# Patient Record
Sex: Male | Born: 1960
Health system: Southern US, Community
[De-identification: ages and names within clinical notes are randomized; demographics above are authoritative.]

## PROBLEM LIST (undated history)

## (undated) DIAGNOSIS — A401 Sepsis due to streptococcus, group B: Secondary | ICD-10-CM

## (undated) DIAGNOSIS — M1990 Unspecified osteoarthritis, unspecified site: Secondary | ICD-10-CM

## (undated) DIAGNOSIS — G4730 Sleep apnea, unspecified: Secondary | ICD-10-CM

## (undated) DIAGNOSIS — I739 Peripheral vascular disease, unspecified: Secondary | ICD-10-CM

## (undated) DIAGNOSIS — Z973 Presence of spectacles and contact lenses: Secondary | ICD-10-CM

## (undated) DIAGNOSIS — I10 Essential (primary) hypertension: Secondary | ICD-10-CM

## (undated) DIAGNOSIS — E1140 Type 2 diabetes mellitus with diabetic neuropathy, unspecified: Secondary | ICD-10-CM

## (undated) DIAGNOSIS — A498 Other bacterial infections of unspecified site: Secondary | ICD-10-CM

## (undated) DIAGNOSIS — M869 Osteomyelitis, unspecified: Secondary | ICD-10-CM

## (undated) DIAGNOSIS — T8140XA Infection following a procedure, unspecified, initial encounter: Secondary | ICD-10-CM

## (undated) DIAGNOSIS — S92309A Fracture of unspecified metatarsal bone(s), unspecified foot, initial encounter for closed fracture: Secondary | ICD-10-CM

## (undated) DIAGNOSIS — N400 Enlarged prostate without lower urinary tract symptoms: Secondary | ICD-10-CM

## (undated) HISTORY — DX: Sepsis due to Streptococcus, group B: A40.1

## (undated) HISTORY — PX: FRACTURE SURGERY: SHX138

## (undated) HISTORY — DX: Other bacterial infections of unspecified site: A49.8

## (undated) HISTORY — PX: COLONOSCOPY W/ BIOPSIES AND POLYPECTOMY: SHX1376

## (undated) HISTORY — PX: TOE AMPUTATION: SHX809

---

## 2003-07-02 HISTORY — PX: PATELLA RECONSTRUCTION: SHX736

## 2004-12-14 ENCOUNTER — Ambulatory Visit: Payer: Self-pay | Admitting: Unknown Physician Specialty

## 2004-12-29 ENCOUNTER — Ambulatory Visit: Payer: Self-pay | Admitting: Unknown Physician Specialty

## 2005-09-10 ENCOUNTER — Emergency Department: Payer: Self-pay | Admitting: Emergency Medicine

## 2005-11-30 ENCOUNTER — Emergency Department: Payer: Self-pay | Admitting: Internal Medicine

## 2006-05-11 ENCOUNTER — Emergency Department: Payer: Self-pay | Admitting: Emergency Medicine

## 2009-05-10 ENCOUNTER — Emergency Department: Payer: Self-pay | Admitting: Emergency Medicine

## 2009-08-06 ENCOUNTER — Inpatient Hospital Stay: Payer: Self-pay | Admitting: Internal Medicine

## 2009-08-11 ENCOUNTER — Encounter: Admission: RE | Admit: 2009-08-11 | Discharge: 2009-08-11 | Payer: Self-pay | Admitting: Foot & Ankle Surgery

## 2009-08-22 ENCOUNTER — Ambulatory Visit (HOSPITAL_BASED_OUTPATIENT_CLINIC_OR_DEPARTMENT_OTHER): Admission: RE | Admit: 2009-08-22 | Discharge: 2009-08-22 | Payer: Self-pay | Admitting: Foot & Ankle Surgery

## 2009-11-08 ENCOUNTER — Emergency Department: Payer: Self-pay | Admitting: Emergency Medicine

## 2010-03-24 ENCOUNTER — Emergency Department: Payer: Self-pay | Admitting: Emergency Medicine

## 2010-05-31 ENCOUNTER — Emergency Department: Payer: Self-pay | Admitting: Emergency Medicine

## 2010-08-30 ENCOUNTER — Emergency Department: Payer: Self-pay | Admitting: Emergency Medicine

## 2010-09-20 LAB — GLUCOSE, CAPILLARY: Glucose-Capillary: 213 mg/dL — ABNORMAL HIGH (ref 70–99)

## 2010-09-20 LAB — POCT I-STAT 4, (NA,K, GLUC, HGB,HCT)
HCT: 48 % (ref 39.0–52.0)
Hemoglobin: 16.3 g/dL (ref 13.0–17.0)
Sodium: 138 mEq/L (ref 135–145)

## 2010-12-24 ENCOUNTER — Other Ambulatory Visit (HOSPITAL_COMMUNITY): Payer: Self-pay | Admitting: Podiatry

## 2010-12-24 DIAGNOSIS — M86171 Other acute osteomyelitis, right ankle and foot: Secondary | ICD-10-CM

## 2010-12-28 ENCOUNTER — Other Ambulatory Visit (HOSPITAL_COMMUNITY): Payer: Self-pay

## 2010-12-28 ENCOUNTER — Encounter (HOSPITAL_COMMUNITY): Payer: Self-pay

## 2011-01-04 ENCOUNTER — Encounter (HOSPITAL_COMMUNITY)
Admission: RE | Admit: 2011-01-04 | Discharge: 2011-01-04 | Disposition: A | Discharge status: Home / Self Care | Payer: Managed Care, Other (non HMO) | Source: Ambulatory Visit | Attending: Podiatry | Admitting: Podiatry

## 2011-01-04 ENCOUNTER — Encounter (HOSPITAL_COMMUNITY): Payer: Self-pay

## 2011-01-04 DIAGNOSIS — M86171 Other acute osteomyelitis, right ankle and foot: Secondary | ICD-10-CM

## 2011-01-04 DIAGNOSIS — M86179 Other acute osteomyelitis, unspecified ankle and foot: Secondary | ICD-10-CM | POA: Insufficient documentation

## 2011-01-04 HISTORY — DX: Osteomyelitis, unspecified: M86.9

## 2011-01-04 MED ORDER — TECHNETIUM TC 99M MEDRONATE IV KIT
24.0000 | PACK | Freq: Once | INTRAVENOUS | Status: AC | PRN
  Administered 2011-01-04: 24 via INTRAVENOUS

## 2011-11-08 ENCOUNTER — Emergency Department: Payer: Self-pay | Admitting: *Deleted

## 2011-11-08 LAB — BASIC METABOLIC PANEL
Anion Gap: 7 (ref 7–16)
BUN: 15 mg/dL (ref 7–18)
Chloride: 101 mmol/L (ref 98–107)
Osmolality: 280 (ref 275–301)
Potassium: 4 mmol/L (ref 3.5–5.1)
Sodium: 135 mmol/L — ABNORMAL LOW (ref 136–145)

## 2011-11-08 LAB — CBC
HCT: 42.2 % (ref 40.0–52.0)
HGB: 14.4 g/dL (ref 13.0–18.0)
MCHC: 34 g/dL (ref 32.0–36.0)
Platelet: 166 10*3/uL (ref 150–440)
RBC: 4.38 10*6/uL — ABNORMAL LOW (ref 4.40–5.90)
WBC: 9.9 10*3/uL (ref 3.8–10.6)

## 2011-11-08 LAB — CK TOTAL AND CKMB (NOT AT ARMC)
CK, Total: 146 U/L (ref 35–232)
CK-MB: 1.2 ng/mL (ref 0.5–3.6)

## 2011-11-09 LAB — CK TOTAL AND CKMB (NOT AT ARMC): CK, Total: 129 U/L (ref 35–232)

## 2011-11-21 ENCOUNTER — Ambulatory Visit: Payer: Self-pay | Admitting: Cardiovascular Disease

## 2011-11-21 LAB — BASIC METABOLIC PANEL
Anion Gap: 7 (ref 7–16)
BUN: 12 mg/dL (ref 7–18)
Calcium, Total: 9.5 mg/dL (ref 8.5–10.1)
EGFR (Non-African Amer.): >60
Sodium: 138 mmol/L (ref 136–145)

## 2011-12-17 ENCOUNTER — Ambulatory Visit: Payer: Self-pay | Admitting: Urology

## 2012-07-01 HISTORY — PX: PROSTATE ABLATION: SHX6042

## 2012-10-26 ENCOUNTER — Emergency Department: Payer: Self-pay | Admitting: Emergency Medicine

## 2012-10-26 LAB — COMPREHENSIVE METABOLIC PANEL
Alkaline Phosphatase: 90 U/L (ref 50–136)
BUN: 13 mg/dL (ref 7–18)
Bilirubin,Total: 0.5 mg/dL (ref 0.2–1.0)
Chloride: 98 mmol/L (ref 98–107)
Creatinine: 0.93 mg/dL (ref 0.60–1.30)
SGPT (ALT): 25 U/L (ref 12–78)

## 2012-10-26 LAB — CBC
HCT: 40.5 % (ref 40.0–52.0)
HGB: 14 g/dL (ref 13.0–18.0)
MCH: 31.9 pg (ref 26.0–34.0)
RBC: 4.4 10*6/uL (ref 4.40–5.90)

## 2012-10-29 LAB — COMPREHENSIVE METABOLIC PANEL
Albumin: 3.7 g/dL (ref 3.4–5.0)
BUN: 14 mg/dL (ref 7–18)
Bilirubin,Total: 0.3 mg/dL (ref 0.2–1.0)
Calcium, Total: 9.8 mg/dL (ref 8.5–10.1)
Co2: 29 mmol/L (ref 21–32)
Creatinine: 0.95 mg/dL (ref 0.60–1.30)
EGFR (African American): >60
EGFR (Non-African Amer.): >60
Glucose: 249 mg/dL — ABNORMAL HIGH (ref 65–99)
Osmolality: 281 (ref 275–301)
Potassium: 4 mmol/L (ref 3.5–5.1)
Sodium: 136 mmol/L (ref 136–145)
Total Protein: 8.4 g/dL — ABNORMAL HIGH (ref 6.4–8.2)

## 2012-10-29 LAB — CBC
HCT: 41.9 % (ref 40.0–52.0)
HGB: 14.6 g/dL (ref 13.0–18.0)
MCH: 32.4 pg (ref 26.0–34.0)
MCHC: 35 g/dL (ref 32.0–36.0)
MCV: 93 fL (ref 80–100)
RBC: 4.52 10*6/uL (ref 4.40–5.90)
WBC: 9 10*3/uL (ref 3.8–10.6)

## 2012-10-30 ENCOUNTER — Inpatient Hospital Stay: Payer: Self-pay | Admitting: Internal Medicine

## 2012-10-30 LAB — SEDIMENTATION RATE: Erythrocyte Sed Rate: 46 mm/hr — ABNORMAL HIGH (ref 0–20)

## 2012-10-31 LAB — VANCOMYCIN, TROUGH: Vancomycin, Trough: 10 ug/mL (ref 10–20)

## 2012-11-01 LAB — CBC WITH DIFFERENTIAL/PLATELET
Basophil #: 0 10*3/uL (ref 0.0–0.1)
Basophil %: 0.4 %
Eosinophil %: 3.3 %
HCT: 40.3 % (ref 40.0–52.0)
HGB: 14.1 g/dL (ref 13.0–18.0)
Lymphocyte %: 29.1 %
MCH: 32 pg (ref 26.0–34.0)
Monocyte #: 0.9 x10 3/mm (ref 0.2–1.0)
Monocyte %: 13.4 %
Neutrophil #: 3.8 10*3/uL (ref 1.4–6.5)
Platelet: 215 10*3/uL (ref 150–440)
WBC: 7 10*3/uL (ref 3.8–10.6)

## 2012-11-04 LAB — CULTURE, BLOOD (SINGLE)

## 2014-03-26 ENCOUNTER — Emergency Department: Payer: Self-pay | Admitting: Student

## 2014-03-26 LAB — BASIC METABOLIC PANEL
ANION GAP: 2 — AB (ref 7–16)
BUN: 18 mg/dL (ref 7–18)
CALCIUM: 8.7 mg/dL (ref 8.5–10.1)
Chloride: 103 mmol/L (ref 98–107)
Co2: 29 mmol/L (ref 21–32)
Creatinine: 0.86 mg/dL (ref 0.60–1.30)
EGFR (African American): >60
Glucose: 150 mg/dL — ABNORMAL HIGH (ref 65–99)
OSMOLALITY: 273 (ref 275–301)
POTASSIUM: 4.3 mmol/L (ref 3.5–5.1)
Sodium: 134 mmol/L — ABNORMAL LOW (ref 136–145)

## 2014-03-26 LAB — CBC
HCT: 39.7 % — ABNORMAL LOW (ref 40.0–52.0)
HGB: 13 g/dL (ref 13.0–18.0)
MCH: 30.8 pg (ref 26.0–34.0)
MCHC: 32.6 g/dL (ref 32.0–36.0)
MCV: 94 fL (ref 80–100)
Platelet: 202 10*3/uL (ref 150–440)
RBC: 4.21 10*6/uL — ABNORMAL LOW (ref 4.40–5.90)
RDW: 12.9 % (ref 11.5–14.5)
WBC: 8 10*3/uL (ref 3.8–10.6)

## 2014-10-21 NOTE — Discharge Summary (Signed)
PATIENT NAME:  [name], [name] MR#:  [id number] DATE OF BIRTH:  1960-12-03  DATE OF ADMISSION:  10/30/2012  DATE OF DISCHARGE:  11/01/2012  ADMITTING PHYSICIAN:  Dr. Lunette Stands   PRIMARY CARE PHYSICIAN: Dr. Elijio Miles  CONSULTATIONS:  Podiatry, Samara Deist  PROCEDURE:  MRI of the foot, right.  HOSPITAL COURSE: This 54 year old gentleman with past medical history of diabetes mellitus and medication noncompliance presented to the Emergency Room with worsening swelling, pain and rubor in his right foot. He states that he visits a podiatrist, who performed a debridement. However, the podiatrist thought that there was a possibly of osteomyelitis, and instructed the patient to come to the Emergency Room for possible admission.  Please refer to History and Physical for full details. The patient underwent MRI of his right foot, which did not reveal any osteomyelitis, was treated for cellulitis and diabetic ulcer. Dr. Vickki Muff, podiatrist, assessed him and recommended continuation of home p.o. antibiotics, and did not recommend any further debridement or interventions at this time. The patient'[name] presenting glucose was very high due to the fact that he had not been taking his medications for several months. This was controlled with resumption of his oral pills and sliding scale insulin in house. The patient was counseled on medication noncompliance, and agreed to comply in the future. The patient'[name] hospital course was otherwise uncomplicated. He was discharged to home in a stable condition, and advised to follow up with his podiatrist as scheduled, and also with me for control of his diabetes.   DISCHARGE MEDICATIONS:  1.  Janumet 50/1000, 1 tab daily.  2.  Bactrim DS 800/160, 2 tabs b.i.d.  3.  Percocet  5/325, 1 tab every 6 hours as needed.  4.  Benicar HCT 25/40, 1 tab daily.   DISCHARGE INSTRUCTIONS:   Diet:  Water-controlled diet, low sodium diet.   Activity as tolerated. No weight-bearing on right  foot.   Followup:  1 to 2 weeks with Dr. Elijio Miles. Patient should keep podiatrist appointment.   Discharge process time spent: 35 minutes     ____________________________ Venetia Maxon. Elijio Miles, MD sat:mr D: 11/15/2012 13:15:45 ET T: 11/15/2012 22:31:46 ET JOB#: [id number]  cc: Alfredia Ferguson A. Elijio Miles, MD, <Dictator> Veverly Fells MD ELECTRONICALLY SIGNED 11/19/2012 14:09
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
## 2014-10-21 NOTE — H&P (Signed)
PATIENT NAME:  [name], [name] MR#:  [id number] DATE OF BIRTH:  [birth year]  DATE OF ADMISSION:  10/29/2012  REFERRING PHYSICIAN: Dr. Marjean Donna.   PRIMARY CARE PHYSICIAN: Dr. Elijio Miles.   CHIEF COMPLAINT: Right foot swelling.   HISTORY OF PRESENT ILLNESS: The patient is a 54 year old African American male with history of diabetes mellitus poorly controlled per the patient, hypertension, chronic nonhealing ulcer of the right foot. Came to the Emergency Department with complaints of increased swelling of the foot for the last 2 to 3 days. The patient initially presented on 10/26/2012 with complaints of increased swelling and pain in the right foot. Denied having any fever. Denied having any increased drainage. Had workup done. X-ray revealed there was increased lucency which was concerning for osteomyelitis. However, the patient's sed rate was 19. The patient was discharged home. The patient went to his podiatrist as this was not improving. The patient's podiatrist debrided the wound and sent the patient to the Emergency Department. The patient is an extremely poor history. Again, re-emphasize that the patient is not having any fever, drainage from the foot. However, states has somewhat increased pain in the foot. In the Emergency Department, the patient received vancomycin and Zosyn. Has normal white blood cell count.   PAST MEDICAL HISTORY:  1. Diabetes mellitus, on oral medication.  2. Hypertension.  3. Hyperlipidemia.  4. Alcohol use.   PAST SURGICAL HISTORY: Left knee surgery.   ALLERGIES: None.   HOME MEDICATIONS:  1. Percocet 5/325 mg oral every 6 hours as needed.  2. Janumet 50,000 mg once a day.  3. Benicar/hydrochlorothiazide 25/40 mg once a day.  4. Bactrim 800/160 two tablets 2 times a day.   SOCIAL HISTORY: Denies smoking. Drinks alcohol on a regular basis, 3 to 4 beers a day. Denies using any illicit drugs   FAMILY HISTORY: Diabetes mellitus and hypertension. Father died  of stroke at the age of 4.   REVIEW OF SYSTEMS:  CONSTITUTIONAL: Denies any fever, fatigue, weight loss.  EYES: No change in vision.  EARS: No change in hearing. No tinnitus.  RESPIRATORY: No cough, shortness of breath.  CARDIOVASCULAR: No chest pain, palpitations.  GASTROINTESTINAL: Has a good appetite and has regular bowel movements. No abdominal pain.  GENITOURINARY: No dysuria or hematuria.  SKIN: No rash or lesions.  HEMATOLOGIC: No easy bruising or bleeding.  MUSCULOSKELETAL: Pain in the right foot.  ENDOCRINE: No polyuria or polydipsia.  NEUROLOGIC: No weakness, numbness.   PHYSICAL EXAMINATION:  GENERAL: This is a well-built, well-nourished, age-appropriate main lying down in the bed, not in distress.  VITAL SIGNS: Temperature 98.1, pulse 90, blood pressure 146/69, respiratory rate of 16, oxygen saturation is 97% on room air.  HEENT: Head normocephalic, atraumatic. There is no scleral icterus. Conjunctivae normal. Pupils equal and reactive to light. Extraocular movements intact. Mucous membranes moist.  NECK: Supple. No lymphadenopathy. No JVD. No carotid bruit. No pharyngeal erythema.  CHEST: Has no focal tenderness.  LUNGS: Bilaterally clear to auscultation.  HEART: S1 and S2 regular. No murmurs are heard.  LOWER EXTREMITIES: No swelling. No pedal edema. Pulses  in the dorsalis pedis bilaterally.  ABDOMEN: Bowel sounds present. Soft, nontender, nondistended.  MUSCULOSKELETAL: Good range of motion in all of the extremities. Has a right foot ulcer.  SKIN: No rash or lesions.  LYMPHATIC: No cervical, axillary or inguinal lymphadenopathy.  NEUROLOGIC: The patient is alert, oriented to place, person and time. Cranial nerves II through XII intact. No motor and sensory deficits.  LABS: X-RAY OF THE FOOT: There is a lucency of the distal aspect of the proximal phalanx of the great toe. This could reflect osteomyelitis in the appropriate clinical setting.   LOWER EXTREMITY  DOPPLERS: No DVT.    Sed rate is 19.   CMP: Except for a glucose of 205, the rest of the values are within normal limits.   CBC: WBC of 9, hemoglobin 14.6, platelet count of 204.   ASSESSMENT AND PLAN: The patient is a 54 year old male who comes to the Emergency Department with increased pain and swelling on the right below the great toe.  1. Right foot osteomyelitis: This does not seem to be anything acute. Does not have increased swelling or redness around this area. No fluctuance. Has a red base. Seems to have good granulation tissue. Will obtain sed rate and CRP. Obtain MRI of the right foot. Will continue with vancomycin and Zosyn for now. Consider consulting podiatry in the morning.  2. Diabetes mellitus, poorly controlled: Will obtain hemoglobin A1c. Will hold the oral medications for now. Continue with sliding scale insulin.  3. Hypertension, currently well controlled: Continue with home medications.  4. Alcohol use: Counseled with the patient. However, the patient seems to have poor insight.  5. Keep the patient on deep vein thrombosis prophylaxis with Lovenox.   TIME SPENT: 50 minutes.   ____________________________ Monica Becton, MD pv:gb D: 10/30/2012 01:41:39 ET T: 10/30/2012 02:54:05 ET JOB#: [id number]  cc: Monica Becton, MD, <Dictator> Sheikh A. Elijio Miles, MD Monica Becton MD ELECTRONICALLY SIGNED 11/02/2012 7:02

## 2014-10-23 NOTE — Op Note (Signed)
PATIENT NAME:  [name], [name] MR#:  [id number] DATE OF BIRTH:  03-12-1961  DATE OF PROCEDURE:  12/17/2011  PREOPERATIVE DIAGNOSES:  1. Benign prostatic hypertrophy.  2. Urinary retention.   POSTOPERATIVE DIAGNOSES:  1. Benign prostatic hypertrophy.  2. Urinary retention.   PROCEDURE: KTP laser prostatectomy.   SURGEON: Edrick Oh, M.D.   ANESTHESIA: Laryngeal mask airway anesthesia.   INDICATIONS: The patient is a 54 year old [place] American gentleman who initially presented for evaluation of other issues. He was found to have significant obstructive voiding symptoms and was found to be in urinary retention. He was placed on a trial of medical management and Foley catheter drainage. He failed subsequent voiding trials. He does have prominent bilobar hypertrophy with high riding bladder neck. He presents for KTP laser prostatectomy.   PROCEDURE: After informed consent was obtained, the patient was taken to the operating room and placed in the dorsal lithotomy position under laryngeal mask airway anesthesia. The patient was then prepped and draped in the usual standard fashion. The KTP laser resectoscope was placed into the urinary bladder utilizing the visual obturator. The prostatic urethra demonstrated bilobar hypertrophy with complete visual obstruction. There was also noted to be at high riding bladder neck. The overall length of the prostatic urethra was relatively short. The verumontanum was easily identified. Some inflammatory changes were present consistent with Foley catheterization. The bladder was inspected in its entirety. Some areas of inflammation were noted on the posterior bladder wall and bladder base, also consistent with catheterization. There was no evidence of tumor or other significant abnormality. Bilateral ureteral orifices were well visualized. They were noted to be an adequate distance from the bladder neck region. The KTP laser fiber was then placed through the scope.  Resection was begun at the level of the bladder neck at the six o'clock position at an 80-watt setting. An incision was made through the bladder neck to the level of the trigone. Additional lateral lobe tissue was taken down. Once an adequate opening was obtained the watts were increased to 120. Additional resection was undertaken of the bilobar tissue. Some anterior tissue was also present. The resection was taken back to the level of the verumontanum. No significant bleeding was encountered. At the completion of the vaporization, an open prostatic channel was present. The laser scope was removed. An 56 French Foley catheter was placed to gravity drainage without difficulty. The patient was returned to the supine position. He was awakened from laryngeal mask airway anesthesia. He was taken to the recovery room in stable condition. There were no problems or complications. The patient tolerated the procedure well.     ____________________________ Denice Bors. Jacqlyn Larsen, MD bsc:bjt D: 12/17/2011 09:57:58 ET T: 12/17/2011 11:26:41 ET JOB#: [id number]  cc: Denice Bors. Jacqlyn Larsen, MD, <Dictator> Denice Bors Katina Remick MD ELECTRONICALLY SIGNED 12/17/2011 13:09

## 2014-10-30 ENCOUNTER — Ambulatory Visit
Admission: EM | Admit: 2014-10-30 | Discharge: 2014-10-30 | Disposition: A | Discharge status: Home / Self Care | Payer: Worker's Compensation | Attending: Internal Medicine | Admitting: Internal Medicine

## 2014-10-30 DIAGNOSIS — L03116 Cellulitis of left lower limb: Secondary | ICD-10-CM | POA: Diagnosis not present

## 2014-10-30 DIAGNOSIS — Z23 Encounter for immunization: Secondary | ICD-10-CM

## 2014-10-30 DIAGNOSIS — S81812A Laceration without foreign body, left lower leg, initial encounter: Secondary | ICD-10-CM | POA: Diagnosis not present

## 2014-10-30 DIAGNOSIS — E118 Type 2 diabetes mellitus with unspecified complications: Secondary | ICD-10-CM | POA: Diagnosis not present

## 2014-10-30 HISTORY — DX: Essential (primary) hypertension: I10

## 2014-10-30 MED ORDER — MUPIROCIN CALCIUM 2 % EX CREA: 1.0000 "application " | TOPICAL_CREAM | Freq: Two times a day (BID) | CUTANEOUS | Status: DC

## 2014-10-30 MED ORDER — CLINDAMYCIN HCL 150 MG PO CAPS: 450.0000 mg | ORAL_CAPSULE | Freq: Three times a day (TID) | ORAL | Status: AC

## 2014-10-30 MED ORDER — TETANUS-DIPHTH-ACELL PERTUSSIS 5-2.5-18.5 LF-MCG/0.5 IM SUSP: 0.5000 mL | Freq: Once | INTRAMUSCULAR | Status: AC

## 2014-10-30 NOTE — ED Notes (Signed)
Pt states "I bumped into a cart at work and scraped leg."

## 2014-10-30 NOTE — ED Provider Notes (Addendum)
CSN: [id number]     Arrival date & time 10/30/14  1222 History   First MD Initiated Contact with Patient 10/30/14 1456     Chief Complaint  Patient presents with  . Abrasion   (Consider location/radiation/quality/duration/timing/severity/associated sxs/prior Treatment) HPI  54y/o african american diabetic male with pain/erythema left anterior shin after scraping leg on metal cart at work 21 Oct 2013.  Patient reports burning sensation to affected area, noted pus discharge a couple days ago had clindamycin at home which he has been taking for past 4 days ran out of pills this am.  Had also been applying topical antibiotic cream to wounds concerned as had partial toe amputation right foot last year from it rubbing on his boot and getting infected.  Here for evaluation of possible leg infection.  Last fingerstick 180 yesterday.  Patient reports normal for him.  Past Medical History  Diagnosis Date  . Diabetes mellitus   . Osteomyelitis of toe of right foot   . Hypertension    Past Surgical History  Procedure Laterality Date  . Patella reconstruction  2005  . Prostate ablation  2014   History reviewed. No pertinent family history. History  Substance Use Topics  . Smoking status: Never Smoker   . Smokeless tobacco: Not on file  . Alcohol Use: No    Review of Systems  Constitutional: Negative.   HENT: Negative.   Eyes: Negative.   Respiratory: Negative.   Cardiovascular: Positive for leg swelling.  Gastrointestinal: Negative.   Musculoskeletal: Positive for myalgias. Negative for back pain, joint swelling, arthralgias, gait problem, neck pain and neck stiffness.  Skin: Positive for color change, rash and wound. Negative for pallor.  Allergic/Immunologic: Negative.   Neurological: Negative.   Hematological: Negative.   Psychiatric/Behavioral: Negative.     Allergies  Review of patient's allergies indicates no known allergies.  Home Medications   Prior to Admission  medications   Medication Sig Start Date End Date Taking? Authorizing Provider  aspirin 81 MG tablet Take 81 mg by mouth daily.   Yes Historical Provider, MD  insulin aspart (NOVOLOG) 100 UNIT/ML injection Inject into the skin 3 (three) times daily before meals.   Yes Historical Provider, MD  insulin detemir (LEVEMIR) 100 UNIT/ML injection Inject 28 Units into the skin at bedtime.   Yes Historical Provider, MD  multivitamin-iron-minerals-folic acid (CENTRUM) chewable tablet Chew 1 tablet by mouth daily.   Yes Historical Provider, MD  olmesartan-hydrochlorothiazide (BENICAR HCT) 40-25 MG per tablet Take 1 tablet by mouth daily.   Yes Historical Provider, MD  tamsulosin (FLOMAX) 0.4 MG CAPS capsule Take 0.4 mg by mouth.   Yes Historical Provider, MD  clindamycin (CLEOCIN) 150 MG capsule Take 3 capsules (450 mg total) by mouth 3 (three) times daily. 10/30/14 11/08/14  Olen Cordial, NP  mupirocin cream (BACTROBAN) 2 % Apply 1 application topically 2 (two) times daily. 10/30/14   Aura Fey Adelita Hone, NP   BP 128/82 mmHg  Pulse 99  Temp(Src) 97.5 F (36.4 C) (Tympanic)  Ht 5\' 10"  (1.778 m)  Wt 245 lb (111.131 kg)  BMI 35.15 kg/m2  SpO2 97% Physical Exam  Constitutional: He is oriented to person, place, and time. He appears well-developed and well-nourished. No distress.  HENT:  Head: Normocephalic and atraumatic.  Mouth/Throat: Oropharynx is clear and moist.  Eyes: Conjunctivae and EOM are normal. Pupils are equal, round, and reactive to light.  Neck: Normal range of motion. Neck supple.  Cardiovascular: Normal rate, regular rhythm, normal heart  sounds and intact distal pulses.   Pulmonary/Chest: Effort normal and breath sounds normal.  Musculoskeletal: Normal range of motion.  Neurological: He is alert and oriented to person, place, and time.  Skin: Skin is warm and dry. Laceration and rash noted. No abrasion, no bruising, no burn, no ecchymosis, no lesion, no petechiae and no purpura noted.  Rash is macular. Rash is not papular, not nodular, not pustular, not vesicular and not urticarial. He is not diaphoretic. There is erythema. No cyanosis. No pallor. Nails show no clubbing.     Psychiatric: He has a normal mood and affect. His behavior is normal. Judgment and thought content normal.    ED Course  Procedures (including critical care time) Labs Review Labs Reviewed - No data to display Workers compensation paperwork completed.  Discussed with patient he is to bring paperwork to [employer] tomorrow to facilitate wound center referral ASAP due to diabetic history and toe amputation with previous abrasions/delayed wound healing.  Follow up in 2 days for wound re-evaluation.  Continue clindamycin, start bactroban, keep affected area covered.  Elevate when at home.  If erythema spreads more than 2 inches tonight go to ER for re-evaluation as may require IV antibiotics.  Exitcare handout on skin infection given to patient.  RTC if worsening erythema, pain, purulent discharge, fever.  Patient verbalized understanding, agreed with plan of care and had no further questions at this time.    Imaging Review No results found.   MDM   1. Cellulitis of left lower extremity   2. Type 2 diabetes mellitus with complication   3. Laceration of leg, left, initial encounter    Patient was recalled 31 Oct 2014 and TDAP vaccine was administered by RN Raylene Miyamoto.   Olen Cordial, NP 10/30/14 2017  Olen Cordial, NP 11/09/14 6767

## 2014-10-30 NOTE — Discharge Instructions (Signed)
Cellulitis Cellulitis is an infection of the skin and the tissue beneath it. The infected area is usually red and tender. Cellulitis occurs most often in the arms and lower legs.  CAUSES  Cellulitis is caused by bacteria that enter the skin through cracks or cuts in the skin. The most common types of bacteria that cause cellulitis are staphylococci and streptococci. SIGNS AND SYMPTOMS   Redness and warmth.  Swelling.  Tenderness or pain.  Fever. DIAGNOSIS  Your health care provider can usually determine what is wrong based on a physical exam. Blood tests may also be done. TREATMENT  Treatment usually involves taking an antibiotic medicine.  Follow up for re-evaluation in two days.  Bring paperwork to [employer] for wound care referral 31 Oct 2014. HOME CARE INSTRUCTIONS   Take your antibiotic medicine as directed by your health care provider. Finish the antibiotic even if you start to feel better.  Keep the infected arm or leg elevated to reduce swelling.  Apply a warm cloth to the affected area up to 4 times per day to relieve pain.  Take medicines only as directed by your health care provider.  Keep all follow-up visits as directed by your health care provider. SEEK MEDICAL CARE IF:   You notice red streaks coming from the infected area.  Your red area gets larger or turns dark in color.  Your bone or joint underneath the infected area becomes painful after the skin has healed.  Your infection returns in the same area or another area.  You notice a swollen bump in the infected area.  You develop new symptoms.  You have a fever. SEEK IMMEDIATE MEDICAL CARE IF:   You feel very sleepy.  You develop vomiting or diarrhea.  You have a general ill feeling (malaise) with muscle aches and pains. MAKE SURE YOU:   Understand these instructions.  Will watch your condition.  Will get help right away if you are not doing well or get worse. Document Released: 03/27/2005  Document Revised: 11/01/2013 Document Reviewed: 09/02/2011 [facility] Patient Information 2015 [place], [place]. This information is not intended to replace advice given to you by your health care provider. Make sure you discuss any questions you have with your health care provider.

## 2014-11-16 ENCOUNTER — Encounter: Payer: Worker's Compensation | Attending: Surgery | Admitting: Surgery

## 2014-11-16 DIAGNOSIS — I10 Essential (primary) hypertension: Secondary | ICD-10-CM | POA: Diagnosis not present

## 2014-11-16 DIAGNOSIS — E11621 Type 2 diabetes mellitus with foot ulcer: Secondary | ICD-10-CM | POA: Insufficient documentation

## 2014-11-16 DIAGNOSIS — L97529 Non-pressure chronic ulcer of other part of left foot with unspecified severity: Secondary | ICD-10-CM | POA: Insufficient documentation

## 2014-11-16 DIAGNOSIS — L97222 Non-pressure chronic ulcer of left calf with fat layer exposed: Secondary | ICD-10-CM | POA: Insufficient documentation

## 2014-11-16 DIAGNOSIS — E1143 Type 2 diabetes mellitus with diabetic autonomic (poly)neuropathy: Secondary | ICD-10-CM | POA: Diagnosis not present

## 2014-11-16 NOTE — Progress Notes (Signed)
[name], [name] ([id number]) Visit Report for 11/16/2014 Abuse/Suicide Risk Screen Details Patient Name: [name], [name]. Date of Service: 11/16/2014 8:15 AM Medical Record Number: [id number] Patient Account Number: [id number] Date of Birth/Sex: [birth year] (54 y.o. Male) Treating RN: Montey Hora Primary Care Physician: PATIENT, NO Other Clinician: Referring Physician: Treating Physician/Extender: Weeks in Treatment: 0 Abuse/Suicide Risk Screen Items Answer ABUSE/SUICIDE RISK SCREEN: Has anyone close to you tried to hurt or harm you recentlyo No Do you feel uncomfortable with anyone in your familyo No Has anyone forced you do things that you didnot want to doo No Do you have any thoughts of harming yourselfo No Patient displays signs or symptoms of abuse and/or neglect. No Electronic Signature(s) Signed: 11/16/2014 9:04:51 AM By: Montey Hora Entered By: Montey Hora on 11/16/2014 09:04:51 [name] ([id number]) -------------------------------------------------------------------------------- Activities of Daily Living Details Patient Name: [name], [name]. Date of Service: 11/16/2014 8:15 AM Medical Record Number: [id number] Patient Account Number: [id number] Date of Birth/Sex: 02-22-1961 (54 y.o. Male) Treating RN: Montey Hora Primary Care Physician: PATIENT, NO Other Clinician: Referring Physician: Treating Physician/Extender: Weeks in Treatment: 0 Activities of Daily Living Items Answer Activities of Daily Living (Please select one for each item) Drive Automobile Completely Able Take Medications Completely Able Use Telephone Completely Able Care for Appearance Completely Able Use Toilet Completely Able Bath / Shower Completely Able Dress Self Completely Able Feed Self Completely Able Walk Completely Able Get In / Out Bed Completely Able Housework Completely Able Prepare Meals Completely Able Handle Money Completely Able Shop for Self Completely  Able Electronic Signature(s) Signed: 11/16/2014 9:05:16 AM By: Montey Hora Entered By: Montey Hora on 11/16/2014 09:05:16 [name] ([id number]) -------------------------------------------------------------------------------- Education Assessment Details Patient Name: [name]. Date of Service: 11/16/2014 8:15 AM Medical Record Number: [id number] Patient Account Number: [id number] Date of Birth/Sex: [birth year] (53 y.o. Male) Treating RN: Montey Hora Primary Care Physician: PATIENT, NO Other Clinician: Referring Physician: Treating Physician/Extender: Suella Grove in Treatment: 0 Primary Learner Assessed: Patient Learning Preferences/Education Level/Primary Language Learning Preference: Explanation, Demonstration Highest Education Level: High School Preferred Language: English Cognitive Barrier Assessment/Beliefs Language Barrier: No Translator Needed: No Memory Deficit: No Emotional Barrier: No Cultural/Religious Beliefs Affecting Medical No Care: Physical Barrier Assessment Impaired Vision: No Impaired Hearing: No Decreased Hand dexterity: No Knowledge/Comprehension Assessment Knowledge Level: Medium Comprehension Level: Medium Ability to understand written Medium instructions: Ability to understand verbal Medium instructions: Motivation Assessment Anxiety Level: Calm Cooperation: Cooperative Education Importance: Acknowledges Need Interest in Health Problems: Asks Questions Perception: Coherent Willingness to Engage in Self- Medium Management Activities: Readiness to Engage in Self- Medium Management Activities: Electronic Signature(s) [name], [name] ([id number]) Signed: 11/16/2014 9:05:39 AM By: Montey Hora Entered By: Montey Hora on 11/16/2014 09:05:39 [name], [name] ([id number]) -------------------------------------------------------------------------------- Fall Risk Assessment Details Patient Name: [name]. Date of  Service: 11/16/2014 8:15 AM Medical Record Number: [id number] Patient Account Number: [id number] Date of Birth/Sex: 09/14/1960 (54 y.o. Male) Treating RN: Montey Hora Primary Care Physician: PATIENT, NO Other Clinician: Referring Physician: Treating Physician/Extender: Suella [place] in Treatment: 0 Fall Risk Assessment Items FALL RISK ASSESSMENT: History of falling - immediate or within 3 months 0 No Secondary diagnosis 0 No Ambulatory aid None/bed rest/wheelchair/nurse 0 Yes Crutches/cane/walker 0 No Furniture 0 No IV Access/Saline Lock 0 No Gait/Training Normal/bed rest/immobile 0 Yes Weak 0 No Impaired 0 No Mental Status Oriented to own ability 0 Yes Electronic Signature(s) Signed: 11/16/2014 6:05:50 AM By: Montey Hora Entered By: Montey Hora on 11/16/2014 09:05:49 [name] ([id number]) --------------------------------------------------------------------------------  Foot Assessment Details Patient Name: [name], [name]. Date of Service: 11/16/2014 8:15 AM Medical Record Number: [id number] Patient Account Number: [id number] Date of Birth/Sex: [birth year] (54 y.o. Male) Treating RN: Montey Hora Primary Care Physician: PATIENT, NO Other Clinician: Referring Physician: Treating Physician/Extender: Suella Grove in Treatment: 0 Foot Assessment Items Site Locations + = Sensation present, - = Sensation absent, C = Callus, U = Ulcer R = Redness, W = Warmth, M = Maceration, PU = Pre-ulcerative lesion F = Fissure, S = Swelling, D = Dryness Assessment Right: Left: Other Deformity: No No Prior Foot Ulcer: No No Prior Amputation: No No Charcot Joint: No No Ambulatory Status: Ambulatory Without Help Gait: Steady Electronic Signature(s) Signed: 11/16/2014 9:06:35 AM By: Montey Hora Entered By: Montey Hora on 11/16/2014 09:06:35 [name] ([id number]) -------------------------------------------------------------------------------- Nutrition Risk Assessment  Details Patient Name: [name]. Date of Service: 11/16/2014 8:15 AM Medical Record Number: [id number] Patient Account Number: [id number] Date of Birth/Sex: Apr 20, 1961 (54 y.o. Male) Treating RN: Montey Hora Primary Care Physician: PATIENT, NO Other Clinician: Referring Physician: Treating Physician/Extender: Suella Grove in Treatment: 0 Height (in): 70 Weight (lbs): 245 Body Mass Index (BMI): 35.2 Nutrition Risk Assessment Items NUTRITION RISK SCREEN: I have an illness or condition that made me change the kind and/or 0 No amount of food I eat I eat fewer than two meals per day 0 No I eat few fruits and vegetables, or milk products 0 No I have three or more drinks of beer, liquor or wine almost every day 0 No I have tooth or mouth problems that make it hard for me to eat 0 No I don't always have enough money to buy the food I need 0 No I eat alone most of the time 0 No I take three or more different prescribed or over-the-counter drugs a 0 No day Without wanting to, I have lost or gained 10 pounds in the last six 0 No months I am not always physically able to shop, cook and/or feed myself 0 No Nutrition Protocols Good Risk Protocol 0 No interventions needed Moderate Risk Protocol Electronic Signature(s) Signed: 11/16/2014 9:06:04 AM By: Montey Hora Entered By: Montey Hora on 11/16/2014 09:06:04

## 2014-11-17 NOTE — Progress Notes (Signed)
LARSON, LIMONES ([id number]) Visit Report for 11/16/2014 Chief Complaint Document Details Patient Name: [name], [name]. Date of Service: 11/16/2014 8:15 AM Medical Record Number: [id number] Patient Account Number: [id number] Date of Birth/Sex: 06-Dec-1960 (54 y.o. Male) Treating RN: Primary Care Physician: PATIENT, NO Other Clinician: Referring Physician: Treating Physician/Extender: BURNS, WALTER Weeks in Treatment: 0 Information Obtained from: Patient Chief Complaint Chronic left traumatic calf ulcer. Left third toe ulcer. Electronic Signature(s) Signed: 11/16/2014 3:18:58 PM By: Loletha Grayer MD Entered By: Loletha Grayer on 11/16/2014 15:10:03 [name] ([id number]) -------------------------------------------------------------------------------- Debridement Details Patient Name: [name]. Date of Service: 11/16/2014 8:15 AM Medical Record Number: [id number] Patient Account Number: [id number] Date of Birth/Sex: 10/13/1960 (54 y.o. Male) Treating RN: Primary Care Physician: PATIENT, NO Other Clinician: Referring Physician: Treating Physician/Extender: BURNS, WALTER Weeks in Treatment: 0 Debridement Performed for Wound #1 Left Lower Leg Assessment: Performed By: Physician , Debridement: Open Wound/Selective Debridement Selective Description: Pre-procedure Yes Verification/Time Out Taken: Start Time: 09:18 Pain Control: Lidocaine 4% Topical Solution Level: Non-Viable Tissue Total Area Debrided (L x 1 (cm) x 1.6 (cm) = 1.6 (cm) W): Tissue and other Non-Viable, Exudate, Fibrin/Slough material debrided: Instrument: Curette Bleeding: Minimum Hemostasis Achieved: Pressure End Time: 09:18 Procedural Pain: 0 Post Procedural Pain: 0 Response to Treatment: Procedure was tolerated well Post Debridement Measurements of Total Wound Length: (cm) 1 Width: (cm) 1.6 Depth: (cm) 0.1 Volume: (cm) 0.126 Electronic Signature(s) Signed: 11/16/2014 3:18:58  PM By: Loletha Grayer MD Entered By: Loletha Grayer on 11/16/2014 15:07:12 [name] ([id number]) -------------------------------------------------------------------------------- Debridement Details Patient Name: [name]. Date of Service: 11/16/2014 8:15 AM Medical Record Number: [id number] Patient Account Number: [id number] Date of Birth/Sex: 1961/02/17 (54 y.o. Male) Treating RN: Primary Care Physician: PATIENT, NO Other Clinician: Referring Physician: Treating Physician/Extender: BURNS, WALTER Weeks in Treatment: 0 Debridement Performed for Wound #2 Left Toe Third Assessment: Performed By: Physician , Debridement: Debridement Pre-procedure Yes Verification/Time Out Taken: Start Time: 09:18 Pain Control: Lidocaine 4% Topical Solution Level: Skin/Subcutaneous Tissue Total Area Debrided (L x 0.3 (cm) x 0.5 (cm) = 0.15 (cm) W): Tissue and other Viable, Non-Viable, Exudate, Fat, Fibrin/Slough, Subcutaneous material debrided: Instrument: Curette Bleeding: Minimum Hemostasis Achieved: Pressure End Time: 09:18 Procedural Pain: 0 Post Procedural Pain: 0 Response to Treatment: Procedure was tolerated well Post Debridement Measurements of Total Wound Length: (cm) 0.3 Width: (cm) 0.5 Depth: (cm) 0.2 Volume: (cm) 0.024 Electronic Signature(s) Signed: 11/16/2014 3:18:58 PM By: Loletha Grayer MD Entered By: Loletha Grayer on 11/16/2014 15:08:03 [name] ([id number]) -------------------------------------------------------------------------------- HPI Details Patient Name: [name]. Date of Service: 11/16/2014 8:15 AM Medical Record Number: [id number] Patient Account Number: [id number] Date of Birth/Sex: 1961-01-03 (54 y.o. Male) Treating RN: Primary Care Physician: PATIENT, NO Other Clinician: Referring Physician: Treating Physician/Extender: BURNS, WALTER Weeks in Treatment: 0 History of Present Illness HPI Description: Pleasant  54 year old with history of diabetes (unknown hemoglobin A1c) and peripheral neuropathy. Status post right great toe partial amputation years ago. He was at work and on 10/22/2014 was injured by a cart, and suffered an ulceration to his left anterior calf. He says that it subsequently became infected, and he was treated with a course of antibiotics. He reports progressive improvement. Not performing dressing changes. Not on any antibiotics at this time. He was found on exam today to have an ulceration on the dorsum of his left third toe. He was unaware of this and attributes it to pressure from his steel toed boots. He denies  any pain. No ischemic rest pain or claudication. Left ABI 1.1. Ambulating normally per his baseline. No fever or chills. No significant drainage. Electronic Signature(s) Signed: 11/16/2014 3:18:58 PM By: Loletha Grayer MD Entered By: Loletha Grayer on 11/16/2014 15:13:24 [name] ([id number]) -------------------------------------------------------------------------------- Physical Exam Details Patient Name: [name], [name]. Date of Service: 11/16/2014 8:15 AM Medical Record Number: [id number] Patient Account Number: [id number] Date of Birth/Sex: 04-Aug-1960 (54 y.o. Male) Treating RN: Primary Care Physician: PATIENT, NO Other Clinician: Referring Physician: Treating Physician/Extender: BURNS, WALTER Weeks in Treatment: 0 Constitutional . Pulse regular. Respirations normal and unlabored. Afebrile. Marland Kitchen Respiratory WNL. No retractions.. Cardiovascular Pedal Pulses WNL. Integumentary (Hair, Skin) .Marland Kitchen Neurological . Psychiatric Judgement and insight Intact.. Oriented times 3.. No evidence of depression, anxiety, or agitation.. Notes Left anterior calf ulcer. Partial-thickness. Starting to re-epithelialize centrally. No evidence for infection. No significant edema. Left third toe ulcer dorsally. Full-thickness. Debrided to healthy subcutaneous fat. No  exposed deep structures. No probing to bone. No evidence for infection. No significant edema. Palpable DP. Left ABI 1.1. Electronic Signature(s) Signed: 11/16/2014 3:18:58 PM By: Loletha Grayer MD Entered By: Loletha Grayer on 11/16/2014 15:15:25 [name] ([id number]) -------------------------------------------------------------------------------- Physician Orders Details Patient Name: [name], [name]. Date of Service: 11/16/2014 8:15 AM Medical Record Number: [id number] Patient Account Number: [id number] Date of Birth/Sex: 1960/07/11 (54 y.o. Male) Treating RN: Baruch Gouty, RN, BSN, Velva Harman Primary Care Physician: PATIENT, NO Other Clinician: Referring Physician: Treating Physician/Extender: Suella Grove in Treatment: 0 Verbal / Phone Orders: Yes Clinician: Afful, RN, BSN, Rita Read Back and Verified: Yes Diagnosis Coding Wound Cleansing Wound #1 Left Lower Leg o Clean wound with Normal Saline. Wound #2 Left Toe Third o Clean wound with Normal Saline. Anesthetic Wound #1 Left Lower Leg o Topical Lidocaine 4% cream applied to wound bed prior to debridement Wound #2 Left Toe Third o Topical Lidocaine 4% cream applied to wound bed prior to debridement Primary Wound Dressing Wound #1 Left Lower Leg o Other: - Mupuricin Wound #2 Left Toe Third o Other: - Mupuricin Secondary Dressing Wound #1 Left Lower Leg o Conform/Kerlix o Foam - on the toe Wound #2 Left Toe Third o Conform/Kerlix o Foam - on the toe Dressing Change Frequency Wound #1 Left Lower Leg o Change dressing every day. Wound #2 Left Toe Third o Change dressing every day. JARED, WHORLEY ([id number]) Consults o Vascular - left leg oooo Notes Get notes from [facility] and set up follow up appointment. Electronic Signature(s) Signed: 11/16/2014 6:23:01 AM By: Regan Lemming BSN, RN Entered By: Regan Lemming on 11/16/2014 09:23:01 [name]  ([id number]) -------------------------------------------------------------------------------- Problem List Details Patient Name: [name], [name]. Date of Service: 11/16/2014 8:15 AM Medical Record Number: [id number] Patient Account Number: [id number] Date of Birth/Sex: 10-Apr-1961 (54 y.o. Male) Treating RN: Primary Care Physician: PATIENT, NO Other Clinician: Referring Physician: Treating Physician/Extender: BURNS, WALTER Weeks in Treatment: 0 Active Problems ICD-10 Encounter Code Description Active Date Diagnosis L97.222 Non-pressure chronic ulcer of left calf with fat layer 11/16/2014 Yes exposed E11.621 Type 2 diabetes mellitus with foot ulcer 11/16/2014 Yes E11.43 Type 2 diabetes mellitus with diabetic autonomic (poly) 11/16/2014 Yes neuropathy Inactive Problems Resolved Problems Electronic Signature(s) Signed: 11/16/2014 3:18:58 PM By: Loletha Grayer MD Entered By: Loletha Grayer on 11/16/2014 15:06:52 [name] ([id number]) -------------------------------------------------------------------------------- Progress Note/History and Physical Details Patient Name: [name]. Date of Service: 11/16/2014 8:15 AM Medical Record Number: [id number] Patient Account Number: [id number] Date of Birth/Sex: [birth year] (53  y.o. Male) Treating RN: Primary Care Physician: PATIENT, NO Other Clinician: Referring Physician: Treating Physician/Extender: BURNS, WALTER Weeks in Treatment: 0 Subjective Chief Complaint Information obtained from Patient Chronic left traumatic calf ulcer. Left third toe ulcer. History of Present Illness (HPI) Pleasant 54 year old with history of diabetes (unknown hemoglobin A1c) and peripheral neuropathy. Status post right great toe partial amputation years ago. He was at work and on 10/22/2014 was injured by a cart, and suffered an ulceration to his left anterior calf. He says that it subsequently became infected, and he was treated with a  course of antibiotics. He reports progressive improvement. Not performing dressing changes. Not on any antibiotics at this time. He was found on exam today to have an ulceration on the dorsum of his left third toe. He was unaware of this and attributes it to pressure from his steel toed boots. He denies any pain. No ischemic rest pain or claudication. Left ABI 1.1. Ambulating normally per his baseline. No fever or chills. No significant drainage. Wound History Patient presents with 1 open wound that has been present for approximately 1 month. Patient has been treating wound in the following manner: mupirocin. Laboratory tests have not been performed in the last month. Patient reportedly has not tested positive for an antibiotic resistant organism. Patient reportedly has not tested positive for osteomyelitis. Patient reportedly has had testing performed to evaluate circulation in the legs. Patient experiences the following problems associated with their wounds: infection. Patient History Information obtained from Patient. Allergies No Known Allergies Family History Diabetes - Mother, Father, Heart Disease - Father, Mother, Hypertension - Mother, Father, Stroke - Father, No family history of Cancer, Hereditary Spherocytosis, Kidney Disease, Lung Disease, Seizures, Thyroid Problems, Tuberculosis. Social History Never smoker, Marital Status - Married, Alcohol Use - Never, Drug Use - No History, Caffeine Use - Daily. [name], [name] ([id number]) Medical History Eyes Denies history of Cataracts, Glaucoma, Optic Neuritis Ear/Nose/Mouth/Throat Denies history of Chronic sinus problems/congestion, Middle ear problems Hematologic/Lymphatic Denies history of Anemia, Hemophilia, Human Immunodeficiency Virus, Lymphedema, Sickle Cell Disease Respiratory Denies history of Aspiration, Asthma, Chronic Obstructive Pulmonary Disease (COPD), Pneumothorax, Sleep Apnea,  Tuberculosis Cardiovascular Patient has history of Hypertension Denies history of Angina, Arrhythmia, Congestive Heart Failure, Coronary Artery Disease, Deep Vein Thrombosis, Hypotension, Myocardial Infarction, Peripheral Arterial Disease, Peripheral Venous Disease, Phlebitis, Vasculitis Gastrointestinal Denies history of Cirrhosis , Colitis, Crohn s, Hepatitis A, Hepatitis B, Hepatitis C Endocrine Patient has history of Type II Diabetes Denies history of Type I Diabetes Genitourinary Denies history of End Stage Renal Disease Immunological Denies history of Lupus Erythematosus, Raynaud s, Scleroderma Integumentary (Skin) Denies history of History of Burn, History of pressure wounds Musculoskeletal Denies history of Gout, Rheumatoid Arthritis, Osteoarthritis, Osteomyelitis Neurologic Patient has history of Neuropathy Denies history of Dementia, Quadriplegia, Paraplegia, Seizure Disorder Oncologic Denies history of Received Chemotherapy, Received Radiation Psychiatric Denies history of Anorexia/bulimia, Confinement Anxiety Patient is treated with Insulin. Blood sugar is tested. Review of Systems (ROS) Constitutional Symptoms (General Health) The patient has no complaints or symptoms. Eyes Complains or has symptoms of Glasses / Contacts - glasses. Denies complaints or symptoms of Dry Eyes, Vision Changes. Ear/Nose/Mouth/Throat The patient has no complaints or symptoms. Hematologic/Lymphatic The patient has no complaints or symptoms. Respiratory The patient has no complaints or symptoms. [name], [name] ([id number]) Cardiovascular The patient has no complaints or symptoms. Gastrointestinal The patient has no complaints or symptoms. Endocrine The patient has no complaints or symptoms. Genitourinary The patient has no complaints or symptoms. Immunological  The patient has no complaints or symptoms. Integumentary (Skin) The patient has no complaints or  symptoms. Musculoskeletal The patient has no complaints or symptoms. Neurologic The patient has no complaints or symptoms. Oncologic The patient has no complaints or symptoms. Psychiatric The patient has no complaints or symptoms. Objective Constitutional Pulse regular. Respirations normal and unlabored. Afebrile. Vitals Time Taken: 8:26 AM, Height: 70 in, Source: Stated, Weight: 245 lbs, Source: Stated, BMI: 35.2, Temperature: 98.2 F, Pulse: 82 bpm, Respiratory Rate: 18 breaths/min, Blood Pressure: 149/80 mmHg. Respiratory WNL. No retractions.. Cardiovascular Pedal Pulses WNL. Psychiatric Judgement and insight Intact.. Oriented times 3.. No evidence of depression, anxiety, or agitation.. General Notes: Left anterior calf ulcer. Partial-thickness. Starting to re-epithelialize centrally. No evidence for infection. No significant edema. Left third toe ulcer dorsally. Full-thickness. Debrided to healthy subcutaneous fat. No exposed deep structures. No probing to bone. No evidence for infection. No significant edema. Palpable DP. Left ABI 1.1. [name], [name] ([id number]) Integumentary (Hair, Skin) Wound #1 status is Open. Original cause of wound was Trauma. The wound is located on the Left Lower Leg. The wound measures 1cm length x 1.6cm width x 0.1cm depth; 1.257cm^2 area and 0.126cm^3 volume. The wound is limited to skin breakdown. There is no tunneling or undermining noted. There is a small amount of serous drainage noted. The wound margin is flat and intact. There is large (67-100%) red, pink granulation within the wound bed. There is a small (1-33%) amount of necrotic tissue within the wound bed including Adherent Slough. The periwound skin appearance did not exhibit: Callus, Crepitus, Excoriation, Fluctuance, Friable, Induration, Localized Edema, Rash, Scarring, Dry/Scaly, Maceration, Moist, Atrophie Blanche, Cyanosis, Ecchymosis, Hemosiderin Staining, Mottled, Pallor, Rubor,  Erythema. Periwound temperature was noted as No Abnormality. The periwound has tenderness on palpation. Wound #2 status is Open. Original cause of wound was Footwear Injury. The wound is located on the Left Toe Third. The wound measures 0.3cm length x 0.5cm width x 0.1cm depth; 0.118cm^2 area and 0.012cm^3 volume. The wound is limited to skin breakdown. There is no tunneling or undermining noted. There is a none present amount of drainage noted. The wound margin is flat and intact. There is large (67- 100%) red, pink granulation within the wound bed. There is no necrotic tissue within the wound bed. The periwound skin appearance did not exhibit: Callus, Crepitus, Excoriation, Fluctuance, Friable, Induration, Localized Edema, Rash, Scarring, Dry/Scaly, Maceration, Moist, Atrophie Blanche, Cyanosis, Ecchymosis, Hemosiderin Staining, Mottled, Pallor, Rubor, Erythema. Periwound temperature was noted as No Abnormality. Assessment Active Problems ICD-10 L97.222 - Non-pressure chronic ulcer of left calf with fat layer exposed E11.621 - Type 2 diabetes mellitus with foot ulcer E11.43 - Type 2 diabetes mellitus with diabetic autonomic (poly)neuropathy Left traumatic calf ulcer. Left third toe ulcer, Wagner grade 1. Procedures Wound #1 Wound #1 is a Trauma, Other located on the Left Lower Leg . There was a Non-Viable Tissue Open Wound/Selective [phone]) debridement with total area of 1.6 sq cm performed by . with the following instrument(s): Curette to remove Non-Viable tissue/material including Exudate and Fibrin/Slough after achieving pain control using Lidocaine 4% Topical Solution. A time out was conducted prior to the start of [name], [name]. ([id number]) the procedure. A Minimum amount of bleeding was controlled with Pressure. The procedure was tolerated well with a pain level of 0 throughout and a pain level of 0 following the procedure. Post Debridement Measurements: 1cm length x 1.6cm  width x 0.1cm depth; 0.126cm^3 volume. Wound #2 Wound #2 is a Diabetic  Wound/Ulcer of the Lower Extremity located on the Left Toe Third . There was a Skin/Subcutaneous Tissue Debridement ([id number]) debridement with total area of 0.15 sq cm performed by . with the following instrument(s): Curette to remove Viable and Non-Viable tissue/material including Exudate, Fat, Fibrin/Slough, and Subcutaneous after achieving pain control using Lidocaine 4% Topical Solution. A time out was conducted prior to the start of the procedure. A Minimum amount of bleeding was controlled with Pressure. The procedure was tolerated well with a pain level of 0 throughout and a pain level of 0 following the procedure. Post Debridement Measurements: 0.3cm length x 0.5cm width x 0.2cm depth; 0.024cm^3 volume. Plan Wound Cleansing: Wound #1 Left Lower Leg: Clean wound with Normal Saline. Wound #2 Left Toe Third: Clean wound with Normal Saline. Anesthetic: Wound #1 Left Lower Leg: Topical Lidocaine 4% cream applied to wound bed prior to debridement Wound #2 Left Toe Third: Topical Lidocaine 4% cream applied to wound bed prior to debridement Primary Wound Dressing: Wound #1 Left Lower Leg: Other: - Mupuricin Wound #2 Left Toe Third: Other: - Mupuricin Secondary Dressing: Wound #1 Left Lower Leg: Conform/Kerlix Foam - on the toe Wound #2 Left Toe Third: Conform/Kerlix Foam - on the toe Dressing Change Frequency: Wound #1 Left Lower Leg: Change dressing every day. Wound #2 Left Toe Third: Change dressing every day. Consults ordered were: Vascular - left leg [name], [name]. ([id number]) General Notes: Get notes from [facility] and set up follow up appointment. Mupirocin cream. Offloading recommendations given. Follow up with [facility] (previously seen and missed f/u appt). Electronic Signature(s) Signed: 11/16/2014 3:18:58 PM By: Loletha Grayer MD Entered By: Loletha Grayer on  11/16/2014 15:18:23 [name] ([id number]) -------------------------------------------------------------------------------- ROS/PFSH Details Patient Name: [name], [name]. Date of Service: 11/16/2014 8:15 AM Medical Record Number: [id number] Patient Account Number: [id number] Date of Birth/Sex: 04-24-1961 (54 y.o. Male) Treating RN: Montey Hora Primary Care Physician: PATIENT, NO Other Clinician: Referring Physician: Treating Physician/Extender: Suella Grove in Treatment: 0 Label Progress Note Print Version as History and Physical for this encounter Information Obtained From Patient Wound History Do you currently have one or more open woundso Yes How many open wounds do you currently haveo 1 Approximately how long have you had your woundso 1 month How have you been treating your wound(s) until nowo mupirocin Has your wound(s) ever healed and then re-openedo No Have you had any lab work done in the past montho No Have you tested positive for an antibiotic resistant organism (MRSA, VRE)o No Have you tested positive for osteomyelitis (bone infection)o No Have you had any tests for circulation on your legso Yes Who ordered the testo o Where was the test doneo [facility] Have you had other problems associated with your woundso Infection Eyes Complaints and Symptoms: Positive for: Glasses / Contacts - glasses Negative for: Dry Eyes; Vision Changes Medical History: Negative for: Cataracts; Glaucoma; Optic Neuritis Constitutional Symptoms (General Health) Complaints and Symptoms: No Complaints or Symptoms Ear/Nose/Mouth/Throat Complaints and Symptoms: No Complaints or Symptoms Medical History: Negative for: Chronic sinus problems/congestion; Middle ear problems Hematologic/Lymphatic FORBES, LOLL ([id number]) Complaints and Symptoms: No Complaints or Symptoms Medical History: Negative for: Anemia; Hemophilia; Human Immunodeficiency Virus; Lymphedema; Sickle Cell  Disease Respiratory Complaints and Symptoms: No Complaints or Symptoms Medical History: Negative for: Aspiration; Asthma; Chronic Obstructive Pulmonary Disease (COPD); Pneumothorax; Sleep Apnea; Tuberculosis Cardiovascular Complaints and Symptoms: No Complaints or Symptoms Medical History: Positive for: Hypertension Negative for: Angina; Arrhythmia; Congestive Heart Failure; Coronary Artery  Disease; Deep Vein Thrombosis; Hypotension; Myocardial Infarction; Peripheral Arterial Disease; Peripheral Venous Disease; Phlebitis; Vasculitis Gastrointestinal Complaints and Symptoms: No Complaints or Symptoms Medical History: Negative for: Cirrhosis ; Colitis; Crohnos; Hepatitis A; Hepatitis B; Hepatitis C Endocrine Complaints and Symptoms: No Complaints or Symptoms Medical History: Positive for: Type II Diabetes Negative for: Type I Diabetes Treated with: Insulin Blood sugar tested every day: Yes Tested : 3 times weekly Genitourinary Complaints and Symptoms: No Complaints or Symptoms Medical HistoryOCTAVE, [name] ([id number]) Negative for: End Stage Renal Disease Immunological Complaints and Symptoms: No Complaints or Symptoms Medical History: Negative for: Lupus Erythematosus; Raynaudos; Scleroderma Integumentary (Skin) Complaints and Symptoms: No Complaints or Symptoms Medical History: Negative for: History of Burn; History of pressure wounds Musculoskeletal Complaints and Symptoms: No Complaints or Symptoms Medical History: Negative for: Gout; Rheumatoid Arthritis; Osteoarthritis; Osteomyelitis Neurologic Complaints and Symptoms: No Complaints or Symptoms Medical History: Positive for: Neuropathy Negative for: Dementia; Quadriplegia; Paraplegia; Seizure Disorder Oncologic Complaints and Symptoms: No Complaints or Symptoms Medical History: Negative for: Received Chemotherapy; Received Radiation Psychiatric Complaints and Symptoms: No Complaints or  Symptoms Medical History: Negative for: Anorexia/bulimia; Confinement Anxiety Family and Social History KAHLEEL, FADELEY ([id number]) Cancer: No; Diabetes: Yes - Mother, Father; Heart Disease: Yes - Father, Mother; Hereditary Spherocytosis: No; Hypertension: Yes - Mother, Father; Kidney Disease: No; Lung Disease: No; Seizures: No; Stroke: Yes - Father; Thyroid Problems: No; Tuberculosis: No; Never smoker; Marital Status - Married; Alcohol Use: Never; Drug Use: No History; Caffeine Use: Daily; Financial Concerns: No; Food, Clothing or Shelter Needs: No; Support System Lacking: No; Transportation Concerns: No; Advanced Directives: No; Patient does not want information on Advanced Directives Physician Affirmation I have reviewed and agree with the above information. Electronic Signature(s) Signed: 11/16/2014 3:18:58 PM By: Loletha Grayer MD Signed: 11/16/2014 5:16:10 PM By: Montey Hora Entered By: Loletha Grayer on 11/16/2014 15:17:58 [name] ([id number]) -------------------------------------------------------------------------------- SuperBill Details Patient Name: KORY, RAINS. Date of Service: 11/16/2014 Medical Record Number: [id number] Patient Account Number: [id number] Date of Birth/Sex: 01/22/1961 (54 y.o. Male) Treating RN: Primary Care Physician: PATIENT, NO Other Clinician: Referring Physician: Treating Physician/Extender: BURNS, WALTER Weeks in Treatment: 0 Diagnosis Coding ICD-10 Codes Code Description [phone] Non-pressure chronic ulcer of left calf with fat layer exposed E11.621 Type 2 diabetes mellitus with foot ulcer E11.43 Type 2 diabetes mellitus with diabetic autonomic (poly)neuropathy Facility Procedures CPT4 Code: 60630160 Description: 99214 - WOUND CARE VISIT-LEV 4 EST PT Modifier: Quantity: 1 CPT4 Code: 10932355 Description: 73220 - DEB SUBQ TISSUE 20 SQ CM/< ICD-10 Description Diagnosis E11.621 Type 2 diabetes mellitus with foot  ulcer Modifier: Quantity: 1 CPT4 Code: 25427062 Description: 37628 - DEBRIDE WOUND 1ST 20 SQ CM OR < ICD-10 Description Diagnosis L97.222 Non-pressure chronic ulcer of left calf with fat la Modifier: yer exposed Quantity: 1 Physician Procedures CPT4 Code: 3151761 Description: WC PHYS LEVEL 3 o NEW PT ICD-10 Description Diagnosis L97.222 Non-pressure chronic ulcer of left calf with fat la E11.621 Type 2 diabetes mellitus with foot ulcer Modifier: yer exposed Quantity: 1 CPT4 Code: 6073710 Description: 11042 - WC PHYS SUBQ TISS 20 SQ CM ICD-10 Description Diagnosis E11.621 Type 2 diabetes mellitus with foot ulcer Modifier: Quantity: 1 CPT4 Code: 6269485 ALANI, LACIVITA Description: 97597 - WC PHYS DEBR WO ANESTH 20 SQ CM F. ([id number]) Modifier: Quantity: 1 Electronic Signature(s) Signed: 11/16/2014 3:18:58 PM By: Loletha Grayer MD Entered By: Loletha Grayer on 11/16/2014 15:17:30

## 2014-11-17 NOTE — Progress Notes (Signed)
[name], [name] ([id number]) Visit Report for 11/16/2014 Allergy List Details Patient Name: [name], [name]. Date of Service: 11/16/2014 8:15 AM Medical Record Number: [id number] Patient Account Number: [id number] Date of Birth/Sex: 1961/06/29 (54 y.o. Male) Treating RN: Montey Hora Primary Care Physician: PATIENT, NO Other Clinician: Referring Physician: Treating Physician/Extender: Suella Grove in Treatment: 0 Allergies Active Allergies No Known Allergies Allergy Notes Electronic Signature(s) Signed: 11/16/2014 5:16:10 PM By: Montey Hora Entered By: Montey Hora on 11/16/2014 08:34:51 [name] ([id number]) -------------------------------------------------------------------------------- Arrival Information Details Patient Name: [name]. Date of Service: 11/16/2014 8:15 AM Medical Record Number: [id number] Patient Account Number: [id number] Date of Birth/Sex: [birth year] (54 y.o. Male) Treating RN: Montey Hora Primary Care Physician: PATIENT, NO Other Clinician: Referring Physician: Treating Physician/Extender: BURNS, Charlean Sanfilippo in Treatment: 0 Visit Information Patient Arrived: Ambulatory Arrival Time: 08:20 Accompanied By: self Transfer Assistance: None Patient Identification Verified: Yes Secondary Verification Process Yes Completed: Patient Has Alerts: Yes Patient Alerts: DMII ABI L 1.1 R 1.09 Electronic Signature(s) Signed: 11/16/2014 4:49:03 PM By: Sharon Mt Previous Signature: 11/16/2014 4:44:38 PM Version By: Sharon Mt Previous Signature: 11/16/2014 9:07:12 AM Version By: Montey Hora Entered By: Sharon Mt on 11/16/2014 16:49:03 [name] ([id number]) -------------------------------------------------------------------------------- Clinic Level of Care Assessment Details Patient Name: [name]. Date of Service: 11/16/2014 8:15 AM Medical Record Number: [id number] Patient Account Number: [id number] Date of Birth/Sex: 07/13/1960  (54 y.o. Male) Treating RN: Baruch Gouty, RN, BSN, Velva Harman Primary Care Physician: PATIENT, NO Other Clinician: Referring Physician: Treating Physician/Extender: Suella Grove in Treatment: 0 Clinic Level of Care Assessment Items TOOL 1 Quantity Score []  - Use when EandM and Procedure is performed on INITIAL visit 0 ASSESSMENTS - Nursing Assessment / Reassessment X - General Physical Exam (combine w/ comprehensive assessment (listed just 1 20 below) when performed on new pt. evals) X - Comprehensive Assessment (HX, ROS, Risk Assessments, Wounds Hx, etc.) 1 25 ASSESSMENTS - Wound and Skin Assessment / Reassessment []  - Dermatologic / Skin Assessment (not related to wound area) 0 ASSESSMENTS - Ostomy and/or Continence Assessment and Care []  - Incontinence Assessment and Management 0 []  - Ostomy Care Assessment and Management (repouching, etc.) 0 PROCESS - Coordination of Care X - Simple Patient / Family Education for ongoing care 1 15 X - Complex (extensive) Patient / Family Education for ongoing care 1 20 X - Staff obtains Programmer, systems, Records, Test Results / Process Orders 1 10 []  - Staff telephones HHA, Nursing Homes / Clarify orders / etc 0 []  - Routine Transfer to another Facility (non-emergent condition) 0 []  - Routine Hospital Admission (non-emergent condition) 0 X - New Admissions / Biomedical engineer / Ordering NPWT, Apligraf, etc. 1 15 []  - Emergency Hospital Admission (emergent condition) 0 PROCESS - Special Needs []  - Pediatric / Minor Patient Management 0 []  - Isolation Patient Management 0 [name], [name]. ([id number]) []  - Hearing / Language / Visual special needs 0 []  - Assessment of Community assistance (transportation, D/C planning, etc.) 0 []  - Additional assistance / Altered mentation 0 []  - Support Surface(s) Assessment (bed, cushion, seat, etc.) 0 INTERVENTIONS - Miscellaneous []  - External ear exam 0 []  - Patient Transfer (multiple staff / Civil Service fast streamer / Similar devices)  0 []  - Simple Staple / Suture removal (25 or less) 0 []  - Complex Staple / Suture removal (26 or more) 0 []  - Hypo/Hyperglycemic Management (do not check if billed separately) 0 X - Ankle / Brachial Index (ABI) - do not check if billed separately 1 15 Has  the patient been seen at the hospital within the last three years: Yes Total Score: 120 Level Of Care: New/Established - Level 4 Electronic Signature(s) Signed: 11/16/2014 5:13:49 PM By: Regan Lemming BSN, RN Entered By: Regan Lemming on 11/16/2014 10:01:47 [name] ([id number]) -------------------------------------------------------------------------------- Encounter Discharge Information Details Patient Name: MYLAN, SCHWARZ. Date of Service: 11/16/2014 8:15 AM Medical Record Number: [id number] Patient Account Number: [id number] Date of Birth/Sex: 06/20/1961 (54 y.o. Male) Treating RN: Montey Hora Primary Care Physician: PATIENT, NO Other Clinician: Referring Physician: Treating Physician/Extender: Weeks in Treatment: 0 Encounter Discharge Information Items Discharge Pain Level: 0 Discharge Condition: Stable Ambulatory Status: Ambulatory Discharge Destination: Home Transportation: Private Auto Accompanied By: self Schedule Follow-up Appointment: Yes Medication Reconciliation completed and provided to Patient/Care No Brittain Smithey: Provided on Clinical Summary of Care: 11/16/2014 Form Type Recipient Paper Patient HP Electronic Signature(s) Signed: 11/16/2014 9:31:27 AM By: Ruthine Dose Previous Signature: 11/16/2014 9:09:01 AM Version By: Montey Hora Entered By: Ruthine Dose on 11/16/2014 09:31:27 [name] ([id number]) -------------------------------------------------------------------------------- Lower Extremity Assessment Details Patient Name: [name]. Date of Service: 11/16/2014 8:15 AM Medical Record Number: [id number] Patient Account Number: [id number] Date of Birth/Sex: [birth year] (54 y.o.  Male) Treating RN: Montey Hora Primary Care Physician: PATIENT, NO Other Clinician: Referring Physician: Treating Physician/Extender: Suella Grove in Treatment: 0 Edema Assessment Assessed: [Left: No] [Right: No] Edema: [Left: Yes] [Right: Yes] Calf Left: Right: Point of Measurement: 38 cm From Medial Instep 38.6 cm 35.7 cm Ankle Left: Right: Point of Measurement: 11 cm From Medial Instep 23 cm 22.6 cm Vascular Assessment Pulses: Posterior Tibial Palpable: [Left:Yes] [Right:Yes] Doppler: [Left:Multiphasic] [Right:Multiphasic] Dorsalis Pedis Palpable: [Left:Yes] [Right:Yes] Doppler: [Left:Multiphasic] [Right:Multiphasic] Extremity colors, hair growth, and conditions: Extremity Color: [Left:Normal] [Right:Normal] Hair Growth on Extremity: [Left:No] [Right:No] Temperature of Extremity: [Left:Warm] [Right:Warm] Capillary Refill: [Left:< 3 seconds] [Right:< 3 seconds] Blood Pressure: Brachial: [Left:134] [Right:138] Dorsalis Pedis: 152 [Left:Dorsalis Pedis: 146] Ankle: Posterior Tibial: 146 [Left:Posterior Tibial: 150 1.10] [Right:1.09] Toe Nail Assessment Left: Right: Thick: Yes Yes Discolored: Yes Yes Deformed: No No Improper Length and Hygiene: No No [name], [name] ([id number]) Electronic Signature(s) Signed: 11/16/2014 5:16:10 PM By: Montey Hora Entered By: Montey Hora on 11/16/2014 09:02:19 [name] ([id number]) -------------------------------------------------------------------------------- Multi Wound Chart Details Patient Name: [name]. Date of Service: 11/16/2014 8:15 AM Medical Record Number: [id number] Patient Account Number: [id number] Date of Birth/Sex: April 29, 1961 (54 y.o. Male) Treating RN: Baruch Gouty, RN, BSN, Velva Harman Primary Care Physician: PATIENT, NO Other Clinician: Referring Physician: Treating Physician/Extender: Suella Grove in Treatment: 0 Vital Signs Height(in): 70 Pulse(bpm): 82 Weight(lbs): 245 Blood Pressure 149/80 (mmHg): Body  Mass Index(BMI): 35 Temperature(F): 98.2 Respiratory Rate 18 (breaths/min): Photos: [1:No Photos] [2:No Photos] [N/A:N/A] Wound Location: [1:Left Lower Leg] [2:Left Toe Third] [N/A:N/A] Wounding Event: [1:Trauma] [2:Footwear Injury] [N/A:N/A] Primary Etiology: [1:Trauma, Other] [2:Diabetic Wound/Ulcer of the Lower Extremity] [N/A:N/A] Comorbid History: [1:Hypertension, Type II Diabetes, Neuropathy] [2:Hypertension, Type II Diabetes, Neuropathy] [N/A:N/A] Date Acquired: [1:10/22/2014] [2:11/16/2014] [N/A:N/A] Weeks of Treatment: [1:0] [2:0] [N/A:N/A] Wound Status: [1:Open] [2:Open] [N/A:N/A] Measurements L x W x D 1x1.6x0.1 [2:0.3x0.5x0.1] [N/A:N/A] (cm) Area (cm) : [1:1.257] [2:0.118] [N/A:N/A] Volume (cm) : [1:0.126] [2:0.012] [N/A:N/A] % Reduction in Area: [1:0.00%] [2:0.00%] [N/A:N/A] % Reduction in Volume: 0.00% [2:0.00%] [N/A:N/A] Classification: [1:Partial Thickness] [2:Grade 1] [N/A:N/A] HBO Classification: [1:Grade 1] [2:N/A] [N/A:N/A] Exudate Amount: [1:Small] [2:None Present] [N/A:N/A] Exudate Type: [1:Serous] [2:N/A] [N/A:N/A] Exudate Color: [1:amber] [2:N/A] [N/A:N/A] Wound Margin: [1:Flat and Intact] [2:Flat and Intact] [N/A:N/A] Granulation Amount: [1:Large (67-100%)] [2:Large (67-100%)] [N/A:N/A] Granulation Quality: [1:Red, Pink] [2:Red, Pink] [  N/A:N/A] Necrotic Amount: [1:Small (1-33%)] [2:None Present (0%)] [N/A:N/A] Exposed Structures: [1:Fascia: No Fat: No Tendon: No Muscle: No Joint: No] [2:Fascia: No Fat: No Tendon: No Muscle: No Joint: No] [N/A:N/A] Bone: No Bone: No Limited to Skin Limited to Skin Breakdown Breakdown Epithelialization: Small (1-33%) None N/A Periwound Skin Texture: Edema: No Edema: No N/A Excoriation: No Excoriation: No Induration: No Induration: No Callus: No Callus: No Crepitus: No Crepitus: No Fluctuance: No Fluctuance: No Friable: No Friable: No Rash: No Rash: No Scarring: No Scarring: No Periwound Skin Maceration:  No Maceration: No N/A Moisture: Moist: No Moist: No Dry/Scaly: No Dry/Scaly: No Periwound Skin Color: Atrophie Blanche: No Atrophie Blanche: No N/A Cyanosis: No Cyanosis: No Ecchymosis: No Ecchymosis: No Erythema: No Erythema: No Hemosiderin Staining: No Hemosiderin Staining: No Mottled: No Mottled: No Pallor: No Pallor: No Rubor: No Rubor: No Temperature: No Abnormality No Abnormality N/A Tenderness on Yes No N/A Palpation: Wound Preparation: Ulcer Cleansing: Ulcer Cleansing: N/A Rinsed/Irrigated with Rinsed/Irrigated with Saline Saline Topical Anesthetic Topical Anesthetic Applied: Other: lidocaine Applied: Other: lidocaine 4% 4% Treatment Notes Electronic Signature(s) Signed: 11/16/2014 5:13:49 PM By: Regan Lemming BSN, RN Entered By: Regan Lemming on 11/16/2014 09:17:52 [name] ([id number]) -------------------------------------------------------------------------------- [place] Details Patient Name: [name], [name]. Date of Service: 11/16/2014 8:15 AM Medical Record Number: [id number] Patient Account Number: [id number] Date of Birth/Sex: 05-28-1961 (54 y.o. Male) Treating RN: Baruch Gouty, RN, BSN, Velva Harman Primary Care Physician: PATIENT, NO Other Clinician: Referring Physician: Treating Physician/Extender: Suella Grove in Treatment: 0 Active Inactive Abuse / Safety / Falls / Self Care Management Nursing Diagnoses: Knowledge deficit related to: safety; personal, health (wound), emergency Potential for falls Self care deficit: actual or potential Goals: Patient will remain injury free Date Initiated: 11/16/2014 Goal Status: Active Patient/caregiver will verbalize/demonstrate measure taken to improve self care Date Initiated: 11/16/2014 Goal Status: Active Patient/caregiver will verbalize/demonstrate measures taken to prevent injury and/or falls Date Initiated: 11/16/2014 Goal Status: Active Patient/caregiver will verbalize/demonstrate  understanding of what to do in case of emergency Date Initiated: 11/16/2014 Goal Status: Active Interventions: Assess fall risk on admission and as needed Assess: immobility, friction, shearing, incontinence upon admission and as needed Assess impairment of mobility on admission and as needed per policy Assess self care needs on admission and as needed Provide education on basic hygiene Provide education on fall prevention Provide education on personal and home safety Provide education on safe transfers Notes: Orientation to the Wound Care Program Nursing Diagnoses: [name], [name] ([id number]) Knowledge deficit related to the wound healing center program Goals: Patient/caregiver will verbalize understanding of the [place] [facility] Program Date Initiated: 11/16/2014 Goal Status: Active Interventions: Provide education on orientation to the wound center Notes: Peripheral Neuropathy Nursing Diagnoses: Knowledge deficit related to disease process and management of peripheral neurovascular dysfunction Potential alteration in peripheral tissue perfusion (select prior to confirmation of diagnosis) Goals: Patient/caregiver will verbalize understanding of disease process and disease management Date Initiated: 11/16/2014 Goal Status: Active Interventions: Assess signs and symptoms of neuropathy upon admission and as needed Provide education on Management of Neuropathy and Related Ulcers Provide education on Management of Neuropathy upon discharge from the [facility] for HBO Treatment Activities: Test ordered outside of clinic : 11/16/2014 Notes: Wound/Skin Impairment Nursing Diagnoses: Impaired tissue integrity Knowledge deficit related to ulceration/compromised skin integrity Goals: Patient/caregiver will verbalize understanding of skin care regimen Date Initiated: 11/16/2014 Goal Status: Active Ulcer/skin breakdown will have a volume reduction of 30% by week 4 Date  Initiated: 11/16/2014 Goal Status:  Active [name], [name] ([id number]) Ulcer/skin breakdown will have a volume reduction of 50% by week 8 Date Initiated: 11/16/2014 Goal Status: Active Ulcer/skin breakdown will have a volume reduction of 80% by week 12 Date Initiated: 11/16/2014 Goal Status: Active Ulcer/skin breakdown will heal within 14 weeks Date Initiated: 11/16/2014 Goal Status: Active Interventions: Assess patient/caregiver ability to obtain necessary supplies Assess patient/caregiver ability to perform ulcer/skin care regimen upon admission and as needed Assess ulceration(s) every visit Provide education on smoking Provide education on ulcer and skin care Treatment Activities: Skin care regimen initiated : 11/16/2014 Topical wound management initiated : 11/16/2014 Notes: Electronic Signature(s) Signed: 11/16/2014 5:13:49 PM By: Regan Lemming BSN, RN Entered By: Regan Lemming on 11/16/2014 10:00:55 [name] ([id number]) -------------------------------------------------------------------------------- Pain Assessment Details Patient Name: [name]. Date of Service: 11/16/2014 8:15 AM Medical Record Number: [id number] Patient Account Number: [id number] Date of Birth/Sex: 1961-04-13 (54 y.o. Male) Treating RN: Montey Hora Primary Care Physician: PATIENT, NO Other Clinician: Referring Physician: Treating Physician/Extender: Suella Grove in Treatment: 0 Active Problems Location of Pain Severity and Description of Pain Patient Has Paino Yes Site Locations Pain Location: Pain in Ulcers With Dressing Change: Yes Duration of the Pain. Constant / Intermittento Constant Character of Pain Describe the Pain: Aching Pain Management and Medication Current Pain Management: Electronic Signature(s) Signed: 11/16/2014 5:16:10 PM By: Montey Hora Entered By: Montey Hora on 11/16/2014 08:30:04 [name]  ([id number]) -------------------------------------------------------------------------------- Patient/Caregiver Education Details Patient Name: [name]. Date of Service: 11/16/2014 8:15 AM Medical Record Number: [id number] Patient Account Number: [id number] Date of Birth/Gender: September 01, 1960 (54 y.o. Male) Treating RN: Montey Hora Primary Care Physician: PATIENT, NO Other Clinician: Referring Physician: Treating Physician/Extender: Suella Grove in Treatment: 0 Education Assessment Education Provided To: Patient Education Topics Provided Wound/Skin Impairment: Handouts: Caring for Your Ulcer, Skin Care Do's and Dont's Methods: Demonstration, Explain/Verbal Responses: State content correctly Electronic Signature(s) Signed: 11/16/2014 9:06:51 AM By: Montey Hora Entered By: Montey Hora on 11/16/2014 09:06:51 [name] ([id number]) -------------------------------------------------------------------------------- Wound Assessment Details Patient Name: [name]. Date of Service: 11/16/2014 8:15 AM Medical Record Number: [id number] Patient Account Number: [id number] Date of Birth/Sex: [birth year] (54 y.o. Male) Treating RN: Montey Hora Primary Care Physician: PATIENT, NO Other Clinician: Referring Physician: Treating Physician/Extender: Weeks in Treatment: 0 Wound Status Wound Number: 1 Primary Trauma, Other Etiology: Wound Location: Left Lower Leg Wound Status: Open Wounding Event: Trauma Comorbid Hypertension, Type II Diabetes, Date Acquired: 10/22/2014 History: Neuropathy Weeks Of Treatment: 0 Clustered Wound: No Photos Photo Uploaded By: Montey Hora on 11/16/2014 12:57:11 Wound Measurements Length: (cm) 1 Width: (cm) 1.6 Depth: (cm) 0.1 Area: (cm) 1.257 Volume: (cm) 0.126 % Reduction in Area: 0% % Reduction in Volume: 0% Epithelialization: Small (1-33%) Tunneling: No Undermining: No Wound Description Classification: Partial Thickness  Foul Odor Af Diabetic Severity (Wagner): Grade 1 Wound Margin: Flat and Intact Exudate Amount: Small Exudate Type: Serous Exudate Color: amber ter Cleansing: No Wound Bed Granulation Amount: Large (67-100%) Exposed Structure Granulation Quality: Red, Pink Fascia Exposed: No Necrotic Amount: Small (1-33%) Fat Layer Exposed: No [name], [name] ([id number]) Necrotic Quality: Adherent Slough Tendon Exposed: No Muscle Exposed: No Joint Exposed: No Bone Exposed: No Limited to Skin Breakdown Periwound Skin Texture Texture Color No Abnormalities Noted: No No Abnormalities Noted: No Callus: No Atrophie Blanche: No Crepitus: No Cyanosis: No Excoriation: No Ecchymosis: No Fluctuance: No Erythema: No Friable: No Hemosiderin Staining: No Induration: No Mottled: No Localized Edema: No Pallor: No Rash: No Rubor: No Scarring: No Temperature /  Pain Moisture Temperature: No Abnormality No Abnormalities Noted: No Tenderness on Palpation: Yes Dry / Scaly: No Maceration: No Moist: No Wound Preparation Ulcer Cleansing: Rinsed/Irrigated with Saline Topical Anesthetic Applied: Other: lidocaine 4%, Treatment Notes Wound #1 (Left Lower Leg) 1. Cleansed with: Clean wound with Normal Saline 2. Anesthetic Topical Lidocaine 4% cream to wound bed prior to debridement 4. Dressing Applied: Other dressing (specify in notes) 5. Secondary Dressing Applied Foam Non-Adherent pad 7. Secured with Tape Notes [email or web address] Electronic Signature(s) Signed: 11/16/2014 9:08:00 AM By: Obie Dredge ([id number]) Entered By: Montey Hora on 11/16/2014 09:07:59 [name], [name] ([id number]) -------------------------------------------------------------------------------- Wound Assessment Details Patient Name: [name], [name]. Date of Service: 11/16/2014 8:15 AM Medical Record Number: [id number] Patient Account Number: [id number] Date of Birth/Sex: 1961/04/24 (54 y.o.  Male) Treating RN: Montey Hora Primary Care Physician: PATIENT, NO Other Clinician: Referring Physician: Treating Physician/Extender: Weeks in Treatment: 0 Wound Status Wound Number: 2 Primary Diabetic Wound/Ulcer of the Lower Etiology: Extremity Wound Location: Left Toe Third Wound Status: Open Wounding Event: Footwear Injury Comorbid Hypertension, Type II Diabetes, Date Acquired: 11/16/2014 History: Neuropathy Weeks Of Treatment: 0 Clustered Wound: No Photos Photo Uploaded By: Montey Hora on 11/16/2014 12:57:12 Wound Measurements Length: (cm) 0.3 Width: (cm) 0.5 Depth: (cm) 0.1 Area: (cm) 0.118 Volume: (cm) 0.012 % Reduction in Area: 0% % Reduction in Volume: 0% Epithelialization: None Tunneling: No Undermining: No Wound Description Classification: Grade 1 Wound Margin: Flat and Intact Exudate Amount: None Present Foul Odor After Cleansing: No Wound Bed Granulation Amount: Large (67-100%) Exposed Structure Granulation Quality: Red, Pink Fascia Exposed: No Necrotic Amount: None Present (0%) Fat Layer Exposed: No Tendon Exposed: No Muscle Exposed: No Joint Exposed: No [name], [name]. ([id number]) Bone Exposed: No Limited to Skin Breakdown Periwound Skin Texture Texture Color No Abnormalities Noted: No No Abnormalities Noted: No Callus: No Atrophie Blanche: No Crepitus: No Cyanosis: No Excoriation: No Ecchymosis: No Fluctuance: No Erythema: No Friable: No Hemosiderin Staining: No Induration: No Mottled: No Localized Edema: No Pallor: No Rash: No Rubor: No Scarring: No Temperature / Pain Moisture Temperature: No Abnormality No Abnormalities Noted: No Dry / Scaly: No Maceration: No Moist: No Wound Preparation Ulcer Cleansing: Rinsed/Irrigated with Saline Topical Anesthetic Applied: Other: lidocaine 4%, Treatment Notes Wound #2 (Left Toe Third) 1. Cleansed with: Clean wound with Normal Saline 2. Anesthetic Topical Lidocaine 4%  cream to wound bed prior to debridement 4. Dressing Applied: Other dressing (specify in notes) 5. Secondary Dressing Applied Foam Non-Adherent pad 7. Secured with Tape Notes [email or web address] Electronic Signature(s) Signed: 11/16/2014 9:08:37 AM By: Montey Hora Entered By: Montey Hora on 11/16/2014 09:08:37 [name] ([id number]) -------------------------------------------------------------------------------- [place] Details Patient Name: [name]. Date of Service: 11/16/2014 8:15 AM Medical Record Number: [id number] Patient Account Number: [id number] Date of Birth/Sex: 11-Dec-1960 (54 y.o. Male) Treating RN: Montey Hora Primary Care Physician: PATIENT, NO Other Clinician: Referring Physician: Treating Physician/Extender: Weeks in Treatment: 0 Vital Signs Time Taken: 08:26 Temperature (F): 98.2 Height (in): 70 Pulse (bpm): 82 Source: Stated Respiratory Rate (breaths/min): 18 Weight (lbs): 245 Blood Pressure (mmHg): 149/80 Source: Stated Reference Range: 80 - 120 mg / dl Body Mass Index (BMI): 35.2 Electronic Signature(s) Signed: 11/16/2014 5:16:10 PM By: Montey Hora Entered By: Montey Hora on 11/16/2014 82:42:35

## 2014-11-23 ENCOUNTER — Encounter: Payer: Worker's Compensation | Admitting: Surgery

## 2014-11-23 DIAGNOSIS — L97222 Non-pressure chronic ulcer of left calf with fat layer exposed: Secondary | ICD-10-CM | POA: Diagnosis not present

## 2014-11-24 NOTE — Progress Notes (Signed)
[name], [name] ([id number]) Visit Report for 11/23/2014 Arrival Information Details Patient Name: [name], [name]. Date of Service: 11/23/2014 3:15 PM Medical Record Number: [id number] Patient Account Number: [id number] Date of Birth/Sex: 1960/09/18 (54 y.o. Male) Treating RN: Cornell Barman Primary Care Physician: PATIENT, NO Other Clinician: Referring Physician: Treating Physician/Extender: BURNS III, WALTER Weeks in Treatment: 1 Visit Information History Since Last Visit Added or deleted any medications: No Patient Arrived: Ambulatory Any new allergies or adverse reactions: No Arrival Time: 15:26 Had a fall or experienced change in No Accompanied By: self activities of daily living that may affect Transfer Assistance: None risk of falls: Patient Identification Verified: Yes Signs or symptoms of abuse/neglect since last No Secondary Verification Process Yes visito Completed: Hospitalized since last visit: No Patient Has Alerts: Yes Has Dressing in Place as Prescribed: Yes Patient Alerts: DMII Pain Present Now: No ABI L 1.1 R 1.09 Electronic Signature(s) Signed: 11/23/2014 5:43:06 PM By: Gretta Cool, RN, BSN, Kim RN, BSN Entered By: Gretta Cool, RN, BSN, Kim on 11/23/2014 15:26:34 [name] ([id number]) -------------------------------------------------------------------------------- Clinic Level of Care Assessment Details Patient Name: [name], [name]. Date of Service: 11/23/2014 3:15 PM Medical Record Number: [id number] Patient Account Number: [id number] Date of Birth/Sex: [birth year] (54 y.o. Male) Treating RN: Baruch Gouty, RN, BSN, [place] Primary Care Physician: PATIENT, NO Other Clinician: Referring Physician: Treating Physician/Extender: BURNS III, WALTER Weeks in Treatment: 1 Clinic Level of Care Assessment Items TOOL 4 Quantity Score []  - Use when only an EandM is performed on FOLLOW-UP visit 0 ASSESSMENTS - Nursing Assessment / Reassessment []  - Reassessment of Co-morbidities  (includes updates in patient status) 0 X - Reassessment of Adherence to Treatment Plan 1 5 ASSESSMENTS - Wound and Skin Assessment / Reassessment X - Simple Wound Assessment / Reassessment - one wound 1 5 []  - Complex Wound Assessment / Reassessment - multiple wounds 0 []  - Dermatologic / Skin Assessment (not related to wound area) 0 ASSESSMENTS - Focused Assessment []  - Circumferential Edema Measurements - multi extremities 0 []  - Nutritional Assessment / Counseling / Intervention 0 []  - Lower Extremity Assessment (monofilament, tuning fork, pulses) 0 []  - Peripheral Arterial Disease Assessment (using hand held doppler) 0 ASSESSMENTS - Ostomy and/or Continence Assessment and Care []  - Incontinence Assessment and Management 0 []  - Ostomy Care Assessment and Management (repouching, etc.) 0 PROCESS - Coordination of Care X - Simple Patient / Family Education for ongoing care 1 15 []  - Complex (extensive) Patient / Family Education for ongoing care 0 []  - Staff obtains Programmer, systems, Records, Test Results / Process Orders 0 []  - Staff telephones HHA, Nursing Homes / Clarify orders / etc 0 []  - Routine Transfer to another Facility (non-emergent condition) 0 BRAYDEN, BRODHEAD ([id number]) []  - Routine Hospital Admission (non-emergent condition) 0 []  - New Admissions / Biomedical engineer / Ordering NPWT, Apligraf, etc. 0 []  - Emergency Hospital Admission (emergent condition) 0 X - Simple Discharge Coordination 1 10 []  - Complex (extensive) Discharge Coordination 0 PROCESS - Special Needs []  - Pediatric / Minor Patient Management 0 []  - Isolation Patient Management 0 []  - Hearing / Language / Visual special needs 0 []  - Assessment of Community assistance (transportation, D/C planning, etc.) 0 []  - Additional assistance / Altered mentation 0 []  - Support Surface(s) Assessment (bed, cushion, seat, etc.) 0 INTERVENTIONS - Wound Cleansing / Measurement X - Simple Wound Cleansing - one wound 1  5 []  - Complex Wound Cleansing - multiple wounds 0 X - Wound Imaging (photographs - any  number of wounds) 1 5 []  - Wound Tracing (instead of photographs) 0 X - Simple Wound Measurement - one wound 1 5 []  - Complex Wound Measurement - multiple wounds 0 INTERVENTIONS - Wound Dressings []  - Small Wound Dressing one or multiple wounds 0 []  - Medium Wound Dressing one or multiple wounds 0 []  - Large Wound Dressing one or multiple wounds 0 []  - Application of Medications - topical 0 []  - Application of Medications - injection 0 INTERVENTIONS - Miscellaneous []  - External ear exam 0 IZICK, GASBARRO. ([id number]) []  - Specimen Collection (cultures, biopsies, blood, body fluids, etc.) 0 []  - Specimen(s) / Culture(s) sent or taken to Lab for analysis 0 []  - Patient Transfer (multiple staff / Harrel Lemon Lift / Similar devices) 0 []  - Simple Staple / Suture removal (25 or less) 0 []  - Complex Staple / Suture removal (26 or more) 0 []  - Hypo / Hyperglycemic Management (close monitor of Blood Glucose) 0 []  - Ankle / Brachial Index (ABI) - do not check if billed separately 0 X - Vital Signs 1 5 Has the patient been seen at the hospital within the last three years: Yes Total Score: 55 Level Of Care: New/Established - Level 2 Electronic Signature(s) Signed: 11/23/2014 5:38:29 PM By: Regan Lemming BSN, RN Entered By: Regan Lemming on 11/23/2014 16:27:36 [name] ([id number]) -------------------------------------------------------------------------------- Encounter Discharge Information Details Patient Name: [name]. Date of Service: 11/23/2014 3:15 PM Medical Record Number: [id number] Patient Account Number: [id number] Date of Birth/Sex: Dec 11, 1960 (54 y.o. Male) Treating RN: Baruch Gouty, RN, BSN, Velva Harman Primary Care Physician: PATIENT, NO Other Clinician: Referring Physician: Treating Physician/Extender: BURNS III, Charlean Sanfilippo in Treatment: 1 Encounter Discharge Information Items Discharge Pain  Level: 0 Discharge Condition: Stable Ambulatory Status: Ambulatory Discharge Destination: Home Transportation: Private Auto Accompanied By: self Schedule Follow-up Appointment: Yes Medication Reconciliation completed and provided to Patient/Care Yes [name] [name]: Provided on Clinical Summary of Care: 11/23/2014 Form Type Recipient Paper Patient HP Electronic Signature(s) Signed: 11/23/2014 3:43:55 PM By: Ruthine Dose Entered By: Ruthine Dose on 11/23/2014 15:43:54 [name] ([id number]) -------------------------------------------------------------------------------- Lower Extremity Assessment Details Patient Name: [name]. Date of Service: 11/23/2014 3:15 PM Medical Record Number: [id number] Patient Account Number: [id number] Date of Birth/Sex: Mar 18, 1961 (54 y.o. Male) Treating RN: Cornell Barman Primary Care Physician: PATIENT, NO Other Clinician: Referring Physician: Treating Physician/Extender: BURNS III, WALTER Weeks in Treatment: 1 Edema Assessment Assessed: [Left: No] [Right: No] E[Left: dema] [Right: :] Calf Left: Right: Point of Measurement: 38 cm From Medial Instep 39 cm cm Ankle Left: Right: Point of Measurement: 11 cm From Medial Instep 22.8 cm cm Vascular Assessment Pulses: Posterior Tibial Dorsalis Pedis Palpable: [Left:Yes] Extremity colors, hair growth, and conditions: Extremity Color: [Left:Hyperpigmented] Hair Growth on Extremity: [Left:No] Temperature of Extremity: [Left:Warm] Capillary Refill: [Left:< 3 seconds] Toe Nail Assessment Left: Right: Thick: Yes Discolored: Yes Deformed: No Improper Length and Hygiene: No Electronic Signature(s) Signed: 11/23/2014 5:43:06 PM By: Gretta Cool, RN, BSN, Kim RN, BSN Entered By: Gretta Cool, RN, BSN, Kim on 11/23/2014 15:31:15 [name] ([id number]) -------------------------------------------------------------------------------- Multi Wound Chart Details Patient Name: [name]. Date of  Service: 11/23/2014 3:15 PM Medical Record Number: [id number] Patient Account Number: [id number] Date of Birth/Sex: August 28, 1960 (54 y.o. Male) Treating RN: Baruch Gouty, RN, BSN, Velva Harman Primary Care Physician: PATIENT, NO Other Clinician: Referring Physician: Treating Physician/Extender: BURNS III, WALTER Weeks in Treatment: 1 Vital Signs Height(in): 70 Pulse(bpm): 92 Weight(lbs): 245 Blood Pressure 119/61 (mmHg): Body Mass Index(BMI): 35 Temperature(F): 98.2 Respiratory Rate  18 (breaths/min): Photos: [1:No Photos] [2:No Photos] [N/A:N/A] Wound Location: [1:Left Lower Leg] [2:Left Toe Third] [N/A:N/A] Wounding Event: [1:Trauma] [2:Footwear Injury] [N/A:N/A] Primary Etiology: [1:Trauma, Other] [2:Diabetic Wound/Ulcer of the Lower Extremity] [N/A:N/A] Date Acquired: [1:10/22/2014] [2:11/16/2014] [N/A:N/A] Weeks of Treatment: [1:1] [2:1] [N/A:N/A] Wound Status: [1:Open] [2:Open] [N/A:N/A] Measurements L x W x D 0x0x0 [2:0.3x0.4x0.1] [N/A:N/A] (cm) Area (cm) : [1:0] [1:6.109] [N/A:N/A] Volume (cm) : [1:0] [2:0.009] [N/A:N/A] % Reduction in Area: [1:100.00%] [2:20.30%] [N/A:N/A] % Reduction in Volume: 100.00% [2:25.00%] [N/A:N/A] Classification: [1:Partial Thickness] [2:Grade 1] [N/A:N/A] Periwound Skin Texture: No Abnormalities Noted [2:No Abnormalities Noted] [N/A:N/A] Periwound Skin [1:No Abnormalities Noted] [2:No Abnormalities Noted] [N/A:N/A] Moisture: Periwound Skin Color: No Abnormalities Noted [2:No Abnormalities Noted] [N/A:N/A] Tenderness on [1:No] [2:No] [N/A:N/A] Treatment Notes Electronic Signature(s) Signed: 11/23/2014 5:38:29 PM By: Regan Lemming BSN, RN Entered By: Regan Lemming on 11/23/2014 15:37:25 [name] ([name] ([id number]) -------------------------------------------------------------------------------- [place] Details Patient Name: DAARON, DIMARCO. Date of Service: 11/23/2014 3:15 PM Medical Record Number:  [id number] Patient Account Number: [id number] Date of Birth/Sex: [birth year] (54 y.o. Male) Treating RN: Baruch Gouty, RN, BSN, Velva Harman Primary Care Physician: PATIENT, NO Other Clinician: Referring Physician: Treating Physician/Extender: BURNS III, Charlean Sanfilippo in Treatment: 1 Active Inactive Abuse / Safety / Falls / Self Care Management Nursing Diagnoses: Knowledge deficit related to: safety; personal, health (wound), emergency Potential for falls Self care deficit: actual or potential Goals: Patient will remain injury free Date Initiated: 11/16/2014 Goal Status: Active Patient/caregiver will verbalize/demonstrate measure taken to improve self care Date Initiated: 11/16/2014 Goal Status: Active Patient/caregiver will verbalize/demonstrate measures taken to prevent injury and/or falls Date Initiated: 11/16/2014 Goal Status: Active Patient/caregiver will verbalize/demonstrate understanding of what to do in case of emergency Date Initiated: 11/16/2014 Goal Status: Active Interventions: Assess fall risk on admission and as needed Assess: immobility, friction, shearing, incontinence upon admission and as needed Assess impairment of mobility on admission and as needed per policy Assess self care needs on admission and as needed Provide education on basic hygiene Provide education on fall prevention Provide education on personal and home safety Provide education on safe transfers Notes: Orientation to the Wound Care Program Nursing Diagnoses: FALON, FLINCHUM ([id number]) Knowledge deficit related to the wound healing center program Goals: Patient/caregiver will verbalize understanding of the [facility] Program Date Initiated: 11/16/2014 Goal Status: Active Interventions: Provide education on orientation to the wound center Notes: Peripheral Neuropathy Nursing Diagnoses: Knowledge deficit related to disease process and management of peripheral neurovascular dysfunction Potential  alteration in peripheral tissue perfusion (select prior to confirmation of diagnosis) Goals: Patient/caregiver will verbalize understanding of disease process and disease management Date Initiated: 11/16/2014 Goal Status: Active Interventions: Assess signs and symptoms of neuropathy upon admission and as needed Provide education on Management of Neuropathy and Related Ulcers Provide education on Management of Neuropathy upon discharge from the [facility] Treatment Activities: Test ordered outside of clinic : 11/23/2014 Notes: Wound/Skin Impairment Nursing Diagnoses: Impaired tissue integrity Knowledge deficit related to ulceration/compromised skin integrity Goals: Patient/caregiver will verbalize understanding of skin care regimen Date Initiated: 11/16/2014 Goal Status: Active Ulcer/skin breakdown will have a volume reduction of 30% by week 4 Date Initiated: 11/16/2014 Goal Status: Active ANASTASIOS, MELANDER ([id number]) Ulcer/skin breakdown will have a volume reduction of 50% by week 8 Date Initiated: 11/16/2014 Goal Status: Active Ulcer/skin breakdown will have a volume reduction of 80% by week 12 Date Initiated: 11/16/2014 Goal Status: Active Ulcer/skin breakdown will heal within 14 weeks Date  Initiated: 11/16/2014 Goal Status: Active Interventions: Assess patient/caregiver ability to obtain necessary supplies Assess patient/caregiver ability to perform ulcer/skin care regimen upon admission and as needed Assess ulceration(s) every visit Provide education on smoking Provide education on ulcer and skin care Treatment Activities: Skin care regimen initiated : 11/23/2014 Topical wound management initiated : 11/23/2014 Notes: Electronic Signature(s) Signed: 11/23/2014 5:38:29 PM By: Regan Lemming BSN, RN Entered By: Regan Lemming on 11/23/2014 15:37:17 [name] ([id number]) -------------------------------------------------------------------------------- Pain  Assessment Details Patient Name: [name]. Date of Service: 11/23/2014 3:15 PM Medical Record Number: [id number] Patient Account Number: [id number] Date of Birth/Sex: 05-Apr-1961 (54 y.o. Male) Treating RN: Cornell Barman Primary Care Physician: PATIENT, NO Other Clinician: Referring Physician: Treating Physician/Extender: BURNS III, WALTER Weeks in Treatment: 1 Active Problems Location of Pain Severity and Description of Pain Patient Has Paino No Site Locations Pain Management and Medication Current Pain Management: Electronic Signature(s) Signed: 11/23/2014 5:43:06 PM By: Gretta Cool, RN, BSN, Kim RN, BSN Entered By: Gretta Cool, RN, BSN, Kim on 11/23/2014 15:26:41 [name] ([id number]) -------------------------------------------------------------------------------- Patient/Caregiver Education Details Patient Name: [name]. Date of Service: 11/23/2014 3:15 PM Medical Record Number: [id number] Patient Account Number: [id number] Date of Birth/Gender: 08-25-1960 (54 y.o. Male) Treating RN: Baruch Gouty, RN, BSN, Velva Harman Primary Care Physician: PATIENT, NO Other Clinician: Referring Physician: Treating Physician/Extender: BURNS III, Charlean Sanfilippo in Treatment: 1 Education Assessment Education Provided To: Patient Education Topics Provided Wound/Skin Impairment: Handouts: Caring for Your Ulcer, Skin Care Do's and Dont's, Other: pas toe when wearing steel toe boots Methods: Demonstration, Explain/Verbal Responses: State content correctly Electronic Signature(s) Signed: 11/23/2014 5:38:29 PM By: Regan Lemming BSN, RN Entered By: Regan Lemming on 11/23/2014 15:43:47 [name] ([id number]) -------------------------------------------------------------------------------- Wound Assessment Details Patient Name: [name]. Date of Service: 11/23/2014 3:15 PM Medical Record Number: [id number] Patient Account Number: [id number] Date of Birth/Sex: 04-20-1961 (54 y.o. Male) Treating RN:  Cornell Barman Primary Care Physician: PATIENT, NO Other Clinician: Referring Physician: Treating Physician/Extender: BURNS III, WALTER Weeks in Treatment: 1 Wound Status Wound Number: 1 Primary Etiology: Trauma, Other Wound Location: Left Lower Leg Wound Status: Open Wounding Event: Trauma Date Acquired: 10/22/2014 Weeks Of Treatment: 1 Clustered Wound: No Photos Photo Uploaded By: Gretta Cool, RN, BSN, Kim on 11/23/2014 15:59:53 Wound Measurements Length: (cm) 0 % Reduction i Width: (cm) 0 % Reduction i Depth: (cm) 0 Area: (cm) 0 Volume: (cm) 0 n Area: 100% n Volume: 100% Wound Description Classification: Partial Thickness Periwound Skin Texture Texture Color No Abnormalities Noted: No No Abnormalities Noted: No Moisture No Abnormalities Noted: No Electronic Signature(s) Signed: 11/23/2014 5:43:06 PM By: Gretta Cool, RN, BSN, Kim RN, BSN [place], [place] ([id number]) Entered By: Gretta Cool, RN, BSN, Kim on 11/23/2014 15:33:03 [name] ([id number]) -------------------------------------------------------------------------------- Wound Assessment Details Patient Name: MICHAH, MINTON. Date of Service: 11/23/2014 3:15 PM Medical Record Number: [id number] Patient Account Number: [id number] Date of Birth/Sex: [birth year] (54 y.o. Male) Treating RN: Cornell Barman Primary Care Physician: PATIENT, NO Other Clinician: Referring Physician: Treating Physician/Extender: BURNS III, WALTER Weeks in Treatment: 1 Wound Status Wound Number: 2 Primary Diabetic Wound/Ulcer of the Lower Etiology: Extremity Wound Location: Left Toe Third Wound Status: Open Wounding Event: Footwear Injury Date Acquired: 11/16/2014 Weeks Of Treatment: 1 Clustered Wound: No Photos Photo Uploaded By: Gretta Cool, RN, BSN, Kim on 11/23/2014 15:59:54 Wound Measurements Length: (cm) 0.3 Width: (cm) 0.4 Depth: (cm) 0.1 Area: (cm) 0.094 Volume: (cm) 0.009 % Reduction in Area: 20.3% % Reduction in Volume: 25% Wound  Description Classification: Grade 1 Periwound  Skin Texture Texture Color No Abnormalities Noted: No No Abnormalities Noted: No Moisture No Abnormalities Noted: No Treatment Notes Wound #2 (Left Toe Third) 1. Cleansed with: DANYELL, AWBREY ([id number]) Clean wound with Normal Saline 2. Anesthetic Topical Lidocaine 4% cream to wound bed prior to debridement 4. Dressing Applied: Other dressing (specify in notes) 5. Secondary Dressing Applied Foam Non-Adherent pad 7. Secured with Tape Notes mupirocin Electronic Signature(s) Signed: 11/23/2014 5:43:06 PM By: Gretta Cool, RN, BSN, Kim RN, BSN Entered By: Gretta Cool, RN, BSN, Kim on 11/23/2014 15:33:03 [name] ([id number]) -------------------------------------------------------------------------------- [place] Details Patient Name: ODIE, EDMONDS. Date of Service: 11/23/2014 3:15 PM Medical Record Number: [id number] Patient Account Number: [id number] Date of Birth/Sex: Jun 04, 1961 (54 y.o. Male) Treating RN: Cornell Barman Primary Care Physician: PATIENT, NO Other Clinician: Referring Physician: Treating Physician/Extender: BURNS III, WALTER Weeks in Treatment: 1 Vital Signs Time Taken: 15:30 Temperature (F): 98.2 Height (in): 70 Pulse (bpm): 92 Weight (lbs): 245 Respiratory Rate (breaths/min): 18 Body Mass Index (BMI): 35.2 Blood Pressure (mmHg): 119/61 Reference Range: 80 - 120 mg / dl Electronic Signature(s) Signed: 11/23/2014 5:43:06 PM By: Gretta Cool, RN, BSN, Kim RN, BSN Entered By: Gretta Cool, RN, BSN, Kim on 11/23/2014 15:27:30

## 2014-11-24 NOTE — Progress Notes (Signed)
CORTLANDT, CAPUANO ([id number]) Visit Report for 11/23/2014 Chief Complaint Document Details Patient Name: [name], [name]. Date of Service: 11/23/2014 3:15 PM Medical Record Number: [id number] Patient Account Number: [id number] Date of Birth/Sex: [birth year] (54 y.o. Male) Treating RN: Primary Care Physician: PATIENT, NO Other Clinician: Referring Physician: Treating Physician/Extender: BURNS III, Teigen Bellin Weeks in Treatment: 1 Information Obtained from: Patient Chief Complaint Chronic left calf traumatic ulcer. Left third toe ulcer. Electronic Signature(s) Signed: 11/23/2014 4:38:40 PM By: Loletha Grayer MD Entered By: Loletha Grayer on 11/23/2014 15:45:55 [name] ([id number]) -------------------------------------------------------------------------------- HPI Details Patient Name: [name], [name]. Date of Service: 11/23/2014 3:15 PM Medical Record Number: [id number] Patient Account Number: [id number] Date of Birth/Sex: [birth year] (54 y.o. Male) Treating RN: Primary Care Physician: PATIENT, NO Other Clinician: Referring Physician: Treating Physician/Extender: BURNS III, Kelsey Edman Weeks in Treatment: 1 History of Present Illness HPI Description: Pleasant 54 year old with history of diabetes (unknown hemoglobin A1c) and peripheral neuropathy. Status post right great toe partial amputation years ago. He was at work and on 10/22/2014 was injured by a cart, and suffered an ulceration to his left anterior calf. He says that it subsequently became infected, and he was treated with a course of antibiotics. He reports progressive improvement. Performing dressing changes with mupirocin. He was found on initial exam to have an ulceration on the dorsum of his left third toe. He was unaware of this and attributes it to pressure from his steel toed boots. He denies any pain. No ischemic rest pain or claudication. Left ABI 1.1. Ambulating normally per his baseline. No fever or chills.  No significant drainage. Electronic Signature(s) Signed: 11/23/2014 4:38:40 PM By: Loletha Grayer MD Entered By: Loletha Grayer on 11/23/2014 15:47:07 [name] ([id number]) -------------------------------------------------------------------------------- Physical Exam Details Patient Name: [name], [name]. Date of Service: 11/23/2014 3:15 PM Medical Record Number: [id number] Patient Account Number: [id number] Date of Birth/Sex: 1960/09/29 (54 y.o. Male) Treating RN: Primary Care Physician: PATIENT, NO Other Clinician: Referring Physician: Treating Physician/Extender: BURNS III, Caitlyne Ingham Weeks in Treatment: 1 Constitutional . Pulse regular. Respirations normal and unlabored. Afebrile. . Notes Left anterior calf ulcer re-epithelialized. No drainage. No evidence for infection. No significant edema. Left third toe ulcer dorsally much improved. No evidence for infection. No significant edema. Palpable DP. Left ABI 1.1. Electronic Signature(s) Signed: 11/23/2014 4:38:40 PM By: Loletha Grayer MD Entered By: Loletha Grayer on 11/23/2014 15:48:34 [name] ([id number]) -------------------------------------------------------------------------------- Physician Orders Details Patient Name: [name], [name]. Date of Service: 11/23/2014 3:15 PM Medical Record Number: [id number] Patient Account Number: [id number] Date of Birth/Sex: 07-25-1960 (54 y.o. Male) Treating RN: Baruch Gouty, RN, BSN, Velva Harman Primary Care Physician: PATIENT, NO Other Clinician: Referring Physician: Treating Physician/Extender: BURNS III, Charlean Sanfilippo in Treatment: 1 Verbal / Phone Orders: Yes Clinician: Afful, RN, BSN, Rita Read Back and Verified: Yes Diagnosis Coding Wound Cleansing Wound #2 Left Toe Third o Clean wound with Normal Saline. Skin Barriers/Peri-Wound Care Wound #2 Left Toe Third o Moisturizing lotion Primary Wound Dressing Wound #2 Left Toe Third o Bactroban Secondary  Dressing Wound #2 Left Toe Third o Gauze and Kerlix/Conform Dressing Change Frequency Wound #2 Left Toe Third o Change dressing every day. Follow-up Appointments Wound #2 Left Toe Third o Return Appointment in 2 weeks. Electronic Signature(s) Signed: 11/23/2014 5:38:29 PM By: Regan Lemming BSN, RN Entered By: Regan Lemming on 11/23/2014 15:40:03 [name] ([id number]) -------------------------------------------------------------------------------- Problem List Details Patient Name: [name], [name]. Date of Service: 11/23/2014 3:15 PM Medical Record Number:  [id number] Patient Account Number: [id number] Date of Birth/Sex: [birth year] (54 y.o. Male) Treating RN: Primary Care Physician: PATIENT, NO Other Clinician: Referring Physician: Treating Physician/Extender: BURNS III, Gage Treiber Weeks in Treatment: 1 Active Problems ICD-10 Encounter Code Description Active Date Diagnosis L97.222 Non-pressure chronic ulcer of left calf with fat layer 11/16/2014 Yes exposed E11.621 Type 2 diabetes mellitus with foot ulcer 11/16/2014 Yes E11.43 Type 2 diabetes mellitus with diabetic autonomic (poly) 11/16/2014 Yes neuropathy Inactive Problems Resolved Problems Electronic Signature(s) Signed: 11/23/2014 4:38:40 PM By: Loletha Grayer MD Entered By: Loletha Grayer on 11/23/2014 15:45:26 [name] ([id number]) -------------------------------------------------------------------------------- Progress Note Details Patient Name: [name]. Date of Service: 11/23/2014 3:15 PM Medical Record Number: [id number] Patient Account Number: [id number] Date of Birth/Sex: [birth year] (54 y.o. Male) Treating RN: Primary Care Physician: PATIENT, NO Other Clinician: Referring Physician: Treating Physician/Extender: BURNS III, Shaine Newmark Weeks in Treatment: 1 Subjective Chief Complaint Information obtained from Patient Chronic left calf traumatic ulcer. Left third toe ulcer. History of  Present Illness (HPI) Pleasant 54 year old with history of diabetes (unknown hemoglobin A1c) and peripheral neuropathy. Status post right great toe partial amputation years ago. He was at work and on 10/22/2014 was injured by a cart, and suffered an ulceration to his left anterior calf. He says that it subsequently became infected, and he was treated with a course of antibiotics. He reports progressive improvement. Performing dressing changes with mupirocin. He was found on initial exam to have an ulceration on the dorsum of his left third toe. He was unaware of this and attributes it to pressure from his steel toed boots. He denies any pain. No ischemic rest pain or claudication. Left ABI 1.1. Ambulating normally per his baseline. No fever or chills. No significant drainage. Objective Constitutional Pulse regular. Respirations normal and unlabored. Afebrile. Vitals Time Taken: 3:30 PM, Height: 70 in, Weight: 245 lbs, BMI: 35.2, Temperature: 98.2 F, Pulse: 92 bpm, Respiratory Rate: 18 breaths/min, Blood Pressure: 119/61 mmHg. General Notes: Left anterior calf ulcer re-epithelialized. No drainage. No evidence for infection. No significant edema. Left third toe ulcer dorsally much improved. No evidence for infection. No significant edema. Palpable DP. Left ABI 1.1. Integumentary (Hair, Skin) Wound #1 status is Open. Original cause of wound was Trauma. The wound is located on the Left Lower Leg. The wound measures 0cm length x 0cm width x 0cm depth; 0cm^2 area and 0cm^3 volume. [name], [name] ([id number]) Wound #2 status is Open. Original cause of wound was Footwear Injury. The wound is located on the Left Toe Third. The wound measures 0.3cm length x 0.4cm width x 0.1cm depth; 0.094cm^2 area and 0.009cm^3 volume. Assessment Active Problems ICD-10 L97.222 - Non-pressure chronic ulcer of left calf with fat layer exposed E11.621 - Type 2 diabetes mellitus with foot ulcer E11.43 - Type 2  diabetes mellitus with diabetic autonomic (poly)neuropathy Healed left calf traumatic ulceration. Left third toe ulcer almost healed. Plan Wound Cleansing: Wound #2 Left Toe Third: Clean wound with Normal Saline. Skin Barriers/Peri-Wound Care: Wound #2 Left Toe Third: Moisturizing lotion Primary Wound Dressing: Wound #2 Left Toe Third: Bactroban Secondary Dressing: Wound #2 Left Toe Third: Gauze and Kerlix/Conform Dressing Change Frequency: Wound #2 Left Toe Third: Change dressing every day. Follow-up Appointments: Wound #2 Left Toe Third: Return Appointment in 2 weeks. [name], [name] ([id number]) Continue with mupirocin cream and offloading recommendations. Electronic Signature(s) Signed: 11/23/2014 4:38:40 PM By: Loletha Grayer MD Entered By: Loletha Grayer on 11/23/2014 15:49:08 [name] ([id number]) -------------------------------------------------------------------------------- SuperBill  Details Patient Name: [name], [name]. Date of Service: 11/23/2014 Medical Record Number: [id number] Patient Account Number: [id number] Date of Birth/Sex: [birth year] (54 y.o. Male) Treating RN: Primary Care Physician: PATIENT, NO Other Clinician: Referring Physician: Treating Physician/Extender: BURNS III, Saman Umstead Weeks in Treatment: 1 Diagnosis Coding ICD-10 Codes Code Description L97.222 Non-pressure chronic ulcer of left calf with fat layer exposed E11.621 Type 2 diabetes mellitus with foot ulcer E11.43 Type 2 diabetes mellitus with diabetic autonomic (poly)neuropathy Facility Procedures CPT4 Code: [id number] Description: [phone] - WOUND CARE VISIT-LEV 2 EST PT Modifier: Quantity: 1 Physician Procedures CPT4 Code: [id number] Description: 88325 - WC PHYS LEVEL 2 - EST PT ICD-10 Description Diagnosis L97.222 Non-pressure chronic ulcer of left calf with fat E11.621 Type 2 diabetes mellitus with foot ulcer Modifier: layer exposed Quantity: 1 Electronic  Signature(s) Signed: 11/23/2014 4:38:40 PM By: Loletha Grayer MD Entered By: Loletha Grayer on 11/23/2014 15:49:32

## 2014-12-07 ENCOUNTER — Encounter: Payer: Worker's Compensation | Attending: Surgery | Admitting: Surgery

## 2014-12-07 DIAGNOSIS — E11621 Type 2 diabetes mellitus with foot ulcer: Secondary | ICD-10-CM | POA: Diagnosis not present

## 2014-12-07 DIAGNOSIS — S81801A Unspecified open wound, right lower leg, initial encounter: Secondary | ICD-10-CM | POA: Insufficient documentation

## 2014-12-07 DIAGNOSIS — L97529 Non-pressure chronic ulcer of other part of left foot with unspecified severity: Secondary | ICD-10-CM | POA: Insufficient documentation

## 2014-12-07 DIAGNOSIS — X58XXXA Exposure to other specified factors, initial encounter: Secondary | ICD-10-CM | POA: Insufficient documentation

## 2014-12-07 DIAGNOSIS — E1143 Type 2 diabetes mellitus with diabetic autonomic (poly)neuropathy: Secondary | ICD-10-CM | POA: Insufficient documentation

## 2014-12-07 NOTE — Progress Notes (Signed)
COVEY, BALLER ([id number]) Visit Report for 12/07/2014 Arrival Information Details Patient Name: [name], [name]. Date of Service: 12/07/2014 3:15 PM Medical Record Number: [id number] Patient Account Number: [id number] Date of Birth/Sex: 11-Mar-1961 (54 y.o. Male) Treating RN: Junious Dresser Primary Care Physician: PATIENT, NO Other Clinician: Referring Physician: Treating Physician/Extender: BURNS III, WALTER Weeks in Treatment: 3 Visit Information History Since Last Visit Added or deleted any medications: No Patient Arrived: Ambulatory Any new allergies or adverse reactions: No Arrival Time: 15:23 Had a fall or experienced change in No Accompanied By: self activities of daily living that may affect Transfer Assistance: None risk of falls: Patient Identification Verified: Yes Signs or symptoms of abuse/neglect since last No Secondary Verification Process Yes visito Completed: Hospitalized since last visit: No Patient Has Alerts: Yes Has Dressing in Place as Prescribed: No Patient Alerts: DMII Pain Present Now: No ABI L 1.1 R 1.09 Electronic Signature(s) Signed: 12/07/2014 4:24:07 PM By: Junious Dresser RN Entered By: Junious Dresser on 12/07/2014 15:25:31 [name] ([id number]) -------------------------------------------------------------------------------- Clinic Level of Care Assessment Details Patient Name: [name]. Date of Service: 12/07/2014 3:15 PM Medical Record Number: [id number] Patient Account Number: [id number] Date of Birth/Sex: [birth year] (54 y.o. Male) Treating RN: Junious Dresser Primary Care Physician: PATIENT, NO Other Clinician: Referring Physician: Treating Physician/Extender: BURNS III, WALTER Weeks in Treatment: 3 Clinic Level of Care Assessment Items TOOL 4 Quantity Score []  - Use when only an EandM is performed on FOLLOW-UP visit 0 ASSESSMENTS - Nursing Assessment / Reassessment []  - Reassessment of Co-morbidities (includes updates in  patient status) 0 []  - Reassessment of Adherence to Treatment Plan 0 ASSESSMENTS - Wound and Skin Assessment / Reassessment []  - Simple Wound Assessment / Reassessment - one wound 0 X - Complex Wound Assessment / Reassessment - multiple wounds 2 5 []  - Dermatologic / Skin Assessment (not related to wound area) 0 ASSESSMENTS - Focused Assessment X - Circumferential Edema Measurements - multi extremities 1 5 []  - Nutritional Assessment / Counseling / Intervention 0 X - Lower Extremity Assessment (monofilament, tuning fork, pulses) 1 5 []  - Peripheral Arterial Disease Assessment (using hand held doppler) 0 ASSESSMENTS - Ostomy and/or Continence Assessment and Care []  - Incontinence Assessment and Management 0 []  - Ostomy Care Assessment and Management (repouching, etc.) 0 PROCESS - Coordination of Care X - Simple Patient / Family Education for ongoing care 1 15 []  - Complex (extensive) Patient / Family Education for ongoing care 0 []  - Staff obtains Programmer, systems, Records, Test Results / Process Orders 0 []  - Staff telephones HHA, Nursing Homes / Clarify orders / etc 0 []  - Routine Transfer to another Facility (non-emergent condition) 0 [name], [name] ([id number]) []  - Routine Hospital Admission (non-emergent condition) 0 []  - New Admissions / Insurance Authorizations / Ordering NPWT, Apligraf, etc. 0 []  - Emergency Hospital Admission (emergent condition) 0 X - Simple Discharge Coordination 1 10 []  - Complex (extensive) Discharge Coordination 0 PROCESS - Special Needs []  - Pediatric / Minor Patient Management 0 []  - Isolation Patient Management 0 []  - Hearing / Language / Visual special needs 0 []  - Assessment of Community assistance (transportation, [name]/C planning, etc.) 0 []  - Additional assistance / Altered mentation 0 []  - Support Surface(s) Assessment (bed, cushion, seat, etc.) 0 INTERVENTIONS - Wound Cleansing / Measurement []  - Simple Wound Cleansing - one wound 0 X - Complex Wound  Cleansing - multiple wounds 2 5 X - Wound Imaging (photographs - any number of wounds) 1 5 []  -  Wound Tracing (instead of photographs) 0 []  - Simple Wound Measurement - one wound 0 X - Complex Wound Measurement - multiple wounds 2 5 INTERVENTIONS - Wound Dressings X - Small Wound Dressing one or multiple wounds 2 10 []  - Medium Wound Dressing one or multiple wounds 0 []  - Large Wound Dressing one or multiple wounds 0 []  - Application of Medications - topical 0 []  - Application of Medications - injection 0 INTERVENTIONS - Miscellaneous []  - External ear exam 0 [name], SETO. ([id number]) []  - Specimen Collection (cultures, biopsies, blood, body fluids, etc.) 0 []  - Specimen(s) / Culture(s) sent or taken to Lab for analysis 0 []  - Patient Transfer (multiple staff / Harrel Lemon Lift / Similar devices) 0 []  - Simple Staple / Suture removal (25 or less) 0 []  - Complex Staple / Suture removal (26 or more) 0 []  - Hypo / Hyperglycemic Management (close monitor of Blood Glucose) 0 []  - Ankle / Brachial Index (ABI) - do not check if billed separately 0 X - Vital Signs 1 5 Has the patient been seen at the hospital within the last three years: Yes Total Score: 95 Level Of Care: New/Established - Level 3 Electronic Signature(s) Signed: 12/07/2014 4:24:07 PM By: Junious Dresser RN Entered By: Junious Dresser on 12/07/2014 15:48:21 [name] ([id number]) -------------------------------------------------------------------------------- Encounter Discharge Information Details Patient Name: [name]. Date of Service: 12/07/2014 3:15 PM Medical Record Number: [id number] Patient Account Number: [id number] Date of Birth/Sex: [birth year] (54 y.o. Male) Treating RN: Primary Care Physician: PATIENT, NO Other Clinician: Referring Physician: Treating Physician/Extender: BURNS III, WALTER Weeks in Treatment: 3 Encounter Discharge Information Items Schedule Follow-up Appointment: No Medication  Reconciliation completed No and provided to Patient/Care [name]: Provided on Clinical Summary of Care: 12/07/2014 Form Type Recipient Paper Patient HP Electronic Signature(s) Signed: 12/07/2014 3:58:04 PM By: Ruthine Dose Entered By: Ruthine Dose on 12/07/2014 15:58:03 [name] ([id number]) -------------------------------------------------------------------------------- Lower Extremity Assessment Details Patient Name: [name]. Date of Service: 12/07/2014 3:15 PM Medical Record Number: [id number] Patient Account Number: [id number] Date of Birth/Sex: 01/27/1961 (54 y.o. Male) Treating RN: Junious Dresser Primary Care Physician: PATIENT, NO Other Clinician: Referring Physician: Treating Physician/Extender: BURNS III, WALTER Weeks in Treatment: 3 Edema Assessment Assessed: [Left: Yes] [Right: Yes] Edema: [Left: Yes] [Right: Yes] Calf Left: Right: Point of Measurement: 38 cm From Medial Instep cm 36.9 cm Ankle Left: Right: Point of Measurement: 11 cm From Medial Instep cm 22.7 cm Vascular Assessment Claudication: Claudication Assessment [Left:None] [Right:None] Pulses: Posterior Tibial Palpable: [Left:Yes] [Right:Yes] Dorsalis Pedis Palpable: [Left:Yes] [Right:Yes] Extremity colors, hair growth, and conditions: Extremity Color: [Left:Hyperpigmented] [Right:Hyperpigmented] Hair Growth on Extremity: [Left:No] [Right:No] Temperature of Extremity: [Left:Warm] [Right:Warm] Capillary Refill: [Left:< 3 seconds] [Right:< 3 seconds] Dependent Rubor: [Left:No] [Right:No] Blanched when Elevated: [Left:No] [Right:No] Toe Nail Assessment Left: Right: Thick: Yes Yes Discolored: Yes Yes Deformed: No No Improper Length and Hygiene: No [name], [name] ([id number]) Electronic Signature(s) Signed: 12/07/2014 4:24:07 PM By: Junious Dresser RN Entered By: Junious Dresser on 12/07/2014 15:28:08 [name]  ([id number]) -------------------------------------------------------------------------------- Multi Wound Chart Details Patient Name: [name]. Date of Service: 12/07/2014 3:15 PM Medical Record Number: [id number] Patient Account Number: [id number] Date of Birth/Sex: [birth year] (54 y.o. Male) Treating RN: Junious Dresser Primary Care Physician: PATIENT, NO Other Clinician: Referring Physician: Treating Physician/Extender: BURNS III, WALTER Weeks in Treatment: 3 Vital Signs Height(in): 70 Pulse(bpm): 93 Weight(lbs): 245 Blood Pressure 142/74 (mmHg): Body Mass Index(BMI): 35 Temperature(F): 98.4 Respiratory Rate 20 (breaths/min): Photos: [2:No Photos] [  3:No Photos] [N/A:N/A] Wound Location: [2:Left Toe Third] [3:Right Lower Leg - Anterior N/A] Wounding Event: [2:Footwear Injury] [3:Trauma] [N/A:N/A] Primary Etiology: [2:Diabetic Wound/Ulcer of the Lower Extremity] [3:Trauma, Other] [N/A:N/A] Comorbid History: [2:Hypertension, Type II Diabetes, Neuropathy] [3:Hypertension, Type II Diabetes, Neuropathy] [N/A:N/A] Date Acquired: [2:11/16/2014] [3:11/24/2014] [N/A:N/A] Weeks of Treatment: [2:3] [3:0] [N/A:N/A] Wound Status: [2:Open] [3:Open] [N/A:N/A] Measurements L x W x [name] 0.1x0.2x0.1 [3:0.6x0.9x0.1] [N/A:N/A] (cm) Area (cm) : [2:0.016] [3:0.424] [N/A:N/A] Volume (cm) : [2:0.002] [3:0.042] [N/A:N/A] % Reduction in Area: [2:86.40%] [3:0.00%] [N/A:N/A] % Reduction in Volume: 83.30% [3:0.00%] [N/A:N/A] Classification: [2:Grade 1] [3:Full Thickness Without Exposed Support Structures] [N/A:N/A] HBO Classification: [2:N/A] [3:Grade 1] [N/A:N/A] Exudate Amount: [2:None Present] [3:Small] [N/A:N/A] Exudate Type: [2:N/A] [3:Serosanguineous] [N/A:N/A] Exudate Color: [2:N/A] [3:red, brown] [N/A:N/A] Wound Margin: [2:Distinct, outline attached Indistinct, nonvisible] [N/A:N/A] Granulation Amount: [2:None Present (0%)] [3:Medium (34-66%)] [N/A:N/A] Granulation Quality: [2:N/A] [3:Red,  Pink] [N/A:N/A] Necrotic Amount: [2:Large (67-100%)] [3:Small (1-33%)] [N/A:N/A] Exposed Structures: [2:Fascia: No Fat: No Tendon: No] [3:Fascia: No Fat: No Tendon: No] [N/A:N/A] Muscle: No Muscle: No Joint: No Joint: No Bone: No Bone: No Limited to Skin Limited to Skin Breakdown Breakdown Epithelialization: None Medium (34-66%) N/A Periwound Skin Texture: Edema: No Edema: No N/A Excoriation: No Excoriation: No Induration: No Induration: No Callus: No Callus: No Crepitus: No Crepitus: No Fluctuance: No Fluctuance: No Friable: No Friable: No Rash: No Rash: No Scarring: No Scarring: No Periwound Skin Dry/Scaly: Yes Moist: Yes N/A Moisture: Maceration: No Maceration: No Moist: No Dry/Scaly: No Periwound Skin Color: Atrophie Blanche: No Hemosiderin Staining: Yes N/A Cyanosis: No Atrophie Blanche: No Ecchymosis: No Cyanosis: No Erythema: No Ecchymosis: No Hemosiderin Staining: No Erythema: No Mottled: No Mottled: No Pallor: No Pallor: No Rubor: No Rubor: No Temperature: No Abnormality No Abnormality N/A Tenderness on No No N/A Palpation: Wound Preparation: Ulcer Cleansing: Ulcer Cleansing: N/A Rinsed/Irrigated with Rinsed/Irrigated with Saline Saline Topical Anesthetic Topical Anesthetic Applied: Other: Lidocaine Applied: Other: Lidocaine 4% Ointment 4% Ointment Treatment Notes Electronic Signature(s) Signed: 12/07/2014 4:24:07 PM By: Junious Dresser RN Entered By: Junious Dresser on 12/07/2014 15:44:38 [name] ([id number]) -------------------------------------------------------------------------------- [place] Details Patient Name: [name], WEIDE. Date of Service: 12/07/2014 3:15 PM Medical Record Number: [id number] Patient Account Number: [id number] Date of Birth/Sex: 04/23/1961 (54 y.o. Male) Treating RN: Junious Dresser Primary Care Physician: PATIENT, NO Other Clinician: Referring Physician: Treating Physician/Extender:  BURNS III, WALTER Weeks in Treatment: 3 Active Inactive Abuse / Safety / Falls / Self Care Management Nursing Diagnoses: Knowledge deficit related to: safety; personal, health (wound), emergency Potential for falls Self care deficit: actual or potential Goals: Patient will remain injury free Date Initiated: 11/16/2014 Goal Status: Active Patient/caregiver will verbalize/demonstrate measure taken to improve self care Date Initiated: 11/16/2014 Goal Status: Active Patient/caregiver will verbalize/demonstrate measures taken to prevent injury and/or falls Date Initiated: 11/16/2014 Goal Status: Active Patient/caregiver will verbalize/demonstrate understanding of what to do in case of emergency Date Initiated: 11/16/2014 Goal Status: Active Interventions: Assess fall risk on admission and as needed Assess: immobility, friction, shearing, incontinence upon admission and as needed Assess impairment of mobility on admission and as needed per policy Assess self care needs on admission and as needed Provide education on basic hygiene Provide education on fall prevention Provide education on personal and home safety Provide education on safe transfers Notes: Orientation to the Wound Care Program Nursing Diagnoses: [name], [name] ([id number]) Knowledge deficit related to the wound healing center program Goals: Patient/caregiver will verbalize understanding of the [facility] Date Initiated: 11/16/2014  Goal Status: Active Interventions: Provide education on orientation to the wound center Notes: Peripheral Neuropathy Nursing Diagnoses: Knowledge deficit related to disease process and management of peripheral neurovascular dysfunction Potential alteration in peripheral tissue perfusion (select prior to confirmation of diagnosis) Goals: Patient/caregiver will verbalize understanding of disease process and disease management Date Initiated: 11/16/2014 Goal Status:  Active Interventions: Assess signs and symptoms of neuropathy upon admission and as needed Provide education on Management of Neuropathy and Related Ulcers Provide education on Management of Neuropathy upon discharge from the [facility] for HBO Treatment Activities: Test ordered outside of clinic : 12/07/2014 Notes: Wound/Skin Impairment Nursing Diagnoses: Impaired tissue integrity Knowledge deficit related to ulceration/compromised skin integrity Goals: Patient/caregiver will verbalize understanding of skin care regimen Date Initiated: 11/16/2014 Goal Status: Active Ulcer/skin breakdown will have a volume reduction of 30% by week 4 Date Initiated: 11/16/2014 Goal Status: Active [name], [name] ([id number]) Ulcer/skin breakdown will have a volume reduction of 50% by week 8 Date Initiated: 11/16/2014 Goal Status: Active Ulcer/skin breakdown will have a volume reduction of 80% by week 12 Date Initiated: 11/16/2014 Goal Status: Active Ulcer/skin breakdown will heal within 14 weeks Date Initiated: 11/16/2014 Goal Status: Active Interventions: Assess patient/caregiver ability to obtain necessary supplies Assess patient/caregiver ability to perform ulcer/skin care regimen upon admission and as needed Assess ulceration(s) every visit Provide education on smoking Provide education on ulcer and skin care Treatment Activities: Skin care regimen initiated : 12/07/2014 Topical wound management initiated : 12/07/2014 Notes: Electronic Signature(s) Signed: 12/07/2014 4:24:07 PM By: Junious Dresser RN Entered By: Junious Dresser on 12/07/2014 15:44:15 [name] ([id number]) -------------------------------------------------------------------------------- Pain Assessment Details Patient Name: [name]. Date of Service: 12/07/2014 3:15 PM Medical Record Number: [id number] Patient Account Number: [id number] Date of Birth/Sex: 01-Oct-1960 (54 y.o. Male) Treating RN: Junious Dresser Primary Care Physician: PATIENT, NO Other Clinician: Referring Physician: Treating Physician/Extender: BURNS III, WALTER Weeks in Treatment: 3 Active Problems Location of Pain Severity and Description of Pain Patient Has Paino No Site Locations Pain Management and Medication Current Pain Management: Electronic Signature(s) Signed: 12/07/2014 4:24:07 PM By: Junious Dresser RN Entered By: Junious Dresser on 12/07/2014 15:25:36 [name] ([id number]) -------------------------------------------------------------------------------- Patient/Caregiver Education Details Patient Name: [name]. Date of Service: 12/07/2014 3:15 PM Medical Record Number: [id number] Patient Account Number: [id number] Date of Birth/Gender: 12-Nov-1960 (54 y.o. Male) Treating RN: Junious Dresser Primary Care Physician: PATIENT, NO Other Clinician: Referring Physician: Treating Physician/Extender: BURNS III, Charlean Sanfilippo in Treatment: 3 Education Assessment Education Provided To: Patient Education Topics Provided Safety: Methods: Explain/Verbal Responses: State content correctly Wound/Skin Impairment: Methods: Explain/Verbal Responses: State content correctly Electronic Signature(s) Signed: 12/07/2014 4:24:07 PM By: Junious Dresser RN Entered By: Junious Dresser on 12/07/2014 15:49:30 [name] ([id number]) -------------------------------------------------------------------------------- Wound Assessment Details Patient Name: [name]. Date of Service: 12/07/2014 3:15 PM Medical Record Number: [id number] Patient Account Number: [id number] Date of Birth/Sex: [birth year] (54 y.o. Male) Treating RN: Junious Dresser Primary Care Physician: PATIENT, NO Other Clinician: Referring Physician: Treating Physician/Extender: BURNS III, WALTER Weeks in Treatment: 3 Wound Status Wound Number: 2 Primary Diabetic Wound/Ulcer of the Lower Etiology: Extremity Wound Location: Left Toe Third Wound  Status: Open Wounding Event: Footwear Injury Comorbid Hypertension, Type II Diabetes, Date Acquired: 11/16/2014 History: Neuropathy Weeks Of Treatment: 3 Clustered Wound: No Photos Photo Uploaded By: Junious Dresser on 12/07/2014 16:23:17 Wound Measurements Length: (cm) 0.1 Width: (cm) 0.2 Depth: (cm) 0.1 Area: (cm) 0.016 Volume: (cm) 0.002 % Reduction in Area: 86.4% % Reduction in  Volume: 83.3% Epithelialization: None Tunneling: No Undermining: No Wound Description Classification: Grade 1 Wound Margin: Distinct, outline attached Exudate Amount: None Present Foul Odor After Cleansing: No Wound Bed Granulation Amount: None Present (0%) Exposed Structure Necrotic Amount: Large (67-100%) Fascia Exposed: No Fat Layer Exposed: No Tendon Exposed: No Muscle Exposed: No Joint Exposed: No [name], BOSSMAN. ([id number]) Bone Exposed: No Limited to Skin Breakdown Periwound Skin Texture Texture Color No Abnormalities Noted: No No Abnormalities Noted: No Callus: No Atrophie Blanche: No Crepitus: No Cyanosis: No Excoriation: No Ecchymosis: No Fluctuance: No Erythema: No Friable: No Hemosiderin Staining: No Induration: No Mottled: No Localized Edema: No Pallor: No Rash: No Rubor: No Scarring: No Temperature / Pain Moisture Temperature: No Abnormality No Abnormalities Noted: No Dry / Scaly: Yes Maceration: No Moist: No Wound Preparation Ulcer Cleansing: Rinsed/Irrigated with Saline Topical Anesthetic Applied: Other: Lidocaine 4% Ointment, Treatment Notes Wound #2 (Left Toe Third) 1. Cleansed with: Clean wound with Normal Saline 2. Anesthetic Topical Lidocaine 4% cream to wound bed prior to debridement 4. Dressing Applied: Other dressing (specify in notes) 5. Secondary Dressing Applied Gauze and Kerlix/Conform 7. Secured with Tape Notes [email or web address] Electronic Signature(s) Signed: 12/07/2014 4:24:07 PM By: Junious Dresser RN Entered By: Junious Dresser on  12/07/2014 15:32:32 [name] ([id number]) -------------------------------------------------------------------------------- Wound Assessment Details Patient Name: [name]. Date of Service: 12/07/2014 3:15 PM Medical Record Number: [id number] Patient Account Number: [id number] Date of Birth/Sex: 02-06-1961 (54 y.o. Male) Treating RN: Junious Dresser Primary Care Physician: PATIENT, NO Other Clinician: Referring Physician: Treating Physician/Extender: BURNS III, WALTER Weeks in Treatment: 3 Wound Status Wound Number: 3 Primary Trauma, Other Etiology: Wound Location: Right Lower Leg - Anterior Wound Status: Open Wounding Event: Trauma Comorbid Hypertension, Type II Diabetes, Date Acquired: 11/24/2014 History: Neuropathy Weeks Of Treatment: 0 Clustered Wound: No Photos Photo Uploaded By: Junious Dresser on 12/07/2014 16:23:17 Wound Measurements Length: (cm) 0.6 Width: (cm) 0.9 Depth: (cm) 0.1 Area: (cm) 0.424 Volume: (cm) 0.042 % Reduction in Area: 0% % Reduction in Volume: 0% Epithelialization: Medium (34-66%) Tunneling: No Undermining: No Wound Description Full Thickness Without Exposed Foul Odor A Classification: Support Structures Diabetic Severity Grade 1 (Wagner): Wound Margin: Indistinct, nonvisible Exudate Amount: Small Exudate Type: Serosanguineous Exudate Color: red, brown fter Cleansing: No Wound Bed Granulation Amount: Medium (34-66%) Exposed Structure Granulation Quality: Red, Pink Fascia Exposed: No [name], GOELLER. ([id number]) Necrotic Amount: Small (1-33%) Fat Layer Exposed: No Necrotic Quality: Adherent Slough Tendon Exposed: No Muscle Exposed: No Joint Exposed: No Bone Exposed: No Limited to Skin Breakdown Periwound Skin Texture Texture Color No Abnormalities Noted: No No Abnormalities Noted: No Callus: No Atrophie Blanche: No Crepitus: No Cyanosis: No Excoriation: No Ecchymosis: No Fluctuance: No Erythema: No Friable:  No Hemosiderin Staining: Yes Induration: No Mottled: No Localized Edema: No Pallor: No Rash: No Rubor: No Scarring: No Temperature / Pain Moisture Temperature: No Abnormality No Abnormalities Noted: No Dry / Scaly: No Maceration: No Moist: Yes Wound Preparation Ulcer Cleansing: Rinsed/Irrigated with Saline Topical Anesthetic Applied: Other: Lidocaine 4% Ointment, Treatment Notes Wound #3 (Right, Anterior Lower Leg) 1. Cleansed with: Clean wound with Normal Saline 2. Anesthetic Topical Lidocaine 4% cream to wound bed prior to debridement 4. Dressing Applied: Other dressing (specify in notes) 5. Secondary Dressing Applied Gauze and Kerlix/Conform 7. Secured with Tape Notes [email or web address] Electronic Signature(s) Signed: 12/07/2014 4:24:07 PM By: Junious Dresser RN [name], [name] ([id number]) Entered By: Junious Dresser on 12/07/2014 15:33:18 [name], [name] ([id number]) -------------------------------------------------------------------------------- Vitals Details Patient Name: [name] Rein  F. Date of Service: 12/07/2014 3:15 PM Medical Record Number: [id number] Patient Account Number: [id number] Date of Birth/Sex: 12-11-1960 (54 y.o. Male) Treating RN: Junious Dresser Primary Care Physician: PATIENT, NO Other Clinician: Referring Physician: Treating Physician/Extender: BURNS III, WALTER Weeks in Treatment: 3 Vital Signs Time Taken: 15:25 Temperature (F): 98.4 Height (in): 70 Pulse (bpm): 93 Weight (lbs): 245 Respiratory Rate (breaths/min): 20 Body Mass Index (BMI): 35.2 Blood Pressure (mmHg): 142/74 Reference Range: 80 - 120 mg / dl Electronic Signature(s) Signed: 12/07/2014 4:24:07 PM By: Junious Dresser RN Entered By: Junious Dresser on 12/07/2014 15:25:57

## 2014-12-07 NOTE — Progress Notes (Signed)
[name], [name] ([id number]) Visit Report for 12/07/2014 Chief Complaint Document Details Patient Name: [name], [name]. Date of Service: 12/07/2014 3:15 PM Medical Record Number: [id number] Patient Account Number: [id number] Date of Birth/Sex: [birth year] (54 y.o. Male) Treating RN: Primary Care Physician: PATIENT, NO Other Clinician: Referring Physician: Treating Physician/Extender: BURNS III, WALTER Weeks in Treatment: 3 Information Obtained from: Patient Chief Complaint Chronic left calf traumatic ulcer (healed). Left third toe ulcer. New right calf ulcer. Electronic Signature(s) Signed: 12/07/2014 3:55:41 PM By: Loletha Grayer MD Entered By: Loletha Grayer on 12/07/2014 15:52:28 [name] ([id number]) -------------------------------------------------------------------------------- HPI Details Patient Name: [name], [name]. Date of Service: 12/07/2014 3:15 PM Medical Record Number: [id number] Patient Account Number: [id number] Date of Birth/Sex: Jul 05, 1960 (54 y.o. Male) Treating RN: Primary Care Physician: PATIENT, NO Other Clinician: Referring Physician: Treating Physician/Extender: BURNS III, WALTER Weeks in Treatment: 3 History of Present Illness HPI Description: Pleasant 54 year old with history of diabetes (Hgb A1c 10.8 in 2014) and peripheral neuropathy. Status post right great toe partial amputation years ago. He was at work and on 10/22/2014 was injured by a cart, and suffered an ulceration to his left anterior calf. He says that it subsequently became infected, and he was treated with a course of antibiotics. He reports progressive improvement. Performing dressing changes with mupirocin. He was found on initial exam to have an ulceration on the dorsum of his left third toe. He was unaware of this and attributes it to pressure from his steel toed boots. He returns to clinic for follow-up and says that he injured his right anterior calf on a cart. He denies  any pain. No ischemic rest pain or claudication. Left ABI 1.1. Ambulating normally per his baseline. No fever or chills. No significant drainage. Electronic Signature(s) Signed: 12/07/2014 3:55:41 PM By: Loletha Grayer MD Previous Signature: 12/07/2014 3:19:31 PM Version By: Loletha Grayer MD Entered By: Loletha Grayer on 12/07/2014 15:53:02 [name] ([id number]) -------------------------------------------------------------------------------- Physical Exam Details Patient Name: [name], [name]. Date of Service: 12/07/2014 3:15 PM Medical Record Number: [id number] Patient Account Number: [id number] Date of Birth/Sex: Dec 21, 1960 (54 y.o. Male) Treating RN: Primary Care Physician: PATIENT, NO Other Clinician: Referring Physician: Treating Physician/Extender: BURNS III, WALTER Weeks in Treatment: 3 Constitutional . Pulse regular. Respirations normal and unlabored. Afebrile. Marland Kitchen Respiratory WNL. No retractions.. Cardiovascular Pedal Pulses WNL. Integumentary (Hair, Skin) .Marland Kitchen Neurological . Psychiatric Judgement and insight Intact.. Oriented times 3.. No evidence of depression, anxiety, or agitation.. Notes Left anterior calf ulcer re-epithelialized. No drainage. No evidence for infection. No significant edema. Left third toe ulcer dorsally much improved. No evidence for infection. No significant edema. New right anterior calf ulcer. Partial-thickness. Biofilm wine free with gauze. No surgical debridement required. No evidence for infection. Palpable DP. Left ABI 1.1. Electronic Signature(s) Signed: 12/07/2014 3:55:41 PM By: Loletha Grayer MD Entered By: Loletha Grayer on 12/07/2014 15:54:02 [name] ([id number]) -------------------------------------------------------------------------------- Physician Orders Details Patient Name: [name], [name]. Date of Service: 12/07/2014 3:15 PM Medical Record Number: [id number] Patient Account Number: [id number] Date of  Birth/Sex: Jun 02, 1961 (54 y.o. Male) Treating RN: Junious Dresser Primary Care Physician: PATIENT, NO Other Clinician: Referring Physician: Treating Physician/Extender: BURNS III, Charlean Sanfilippo in Treatment: 3 Verbal / Phone Orders: Yes Clinician: Junious Dresser Read Back and Verified: Yes Diagnosis Coding Wound Cleansing Wound #2 Left Toe Third o Clean wound with Normal Saline. Skin Barriers/Peri-Wound Care Wound #2 Left Toe Third o Moisturizing lotion Primary Wound Dressing Wound #2 Left Toe  Third o Bactroban - once out of bactroban use neosporin Wound #3 Right,Anterior Lower Leg o Bactroban - once out of bactroban use neosporin Secondary Dressing Wound #2 Left Toe Third o Gauze and Kerlix/Conform Wound #3 Right,Anterior Lower Leg o Gauze and Kerlix/Conform Dressing Change Frequency Wound #2 Left Toe Third o Change dressing every day. Follow-up Appointments Wound #2 Left Toe Third o Return Appointment in 2 weeks. Edema Control Wound #3 Right,Anterior Lower Leg o Other: - ACE wrap Medications-please add to medication list. LEVONE, OTTEN ([id number]) Wound #2 Left Toe Third o Topical Antibiotic - bactroban Wound #3 Right,Anterior Lower Leg o Topical Antibiotic - bactroban Electronic Signature(s) Signed: 12/07/2014 3:55:41 PM By: Loletha Grayer MD Signed: 12/07/2014 4:24:07 PM By: Junious Dresser RN Entered By: Junious Dresser on 12/07/2014 15:47:44 [name] ([id number]) -------------------------------------------------------------------------------- Problem List Details Patient Name: [name], [name]. Date of Service: 12/07/2014 3:15 PM Medical Record Number: [id number] Patient Account Number: [id number] Date of Birth/Sex: 11-May-1961 (54 y.o. Male) Treating RN: Primary Care Physician: PATIENT, NO Other Clinician: Referring Physician: Treating Physician/Extender: BURNS III, WALTER Weeks in Treatment: 3 Active  Problems ICD-10 Encounter Code Description Active Date Diagnosis L97.222 Non-pressure chronic ulcer of left calf with fat layer 11/16/2014 Yes exposed E11.621 Type 2 diabetes mellitus with foot ulcer 11/16/2014 Yes E11.43 Type 2 diabetes mellitus with diabetic autonomic (poly) 11/16/2014 Yes neuropathy S81.801A Unspecified open wound, right lower leg, initial encounter 12/07/2014 Yes Inactive Problems Resolved Problems Electronic Signature(s) Signed: 12/07/2014 3:55:41 PM By: Loletha Grayer MD Entered By: Loletha Grayer on 12/07/2014 15:52:02 [name] ([id number]) -------------------------------------------------------------------------------- Progress Note Details Patient Name: [name]. Date of Service: 12/07/2014 3:15 PM Medical Record Number: [id number] Patient Account Number: [id number] Date of Birth/Sex: 1961/05/20 (54 y.o. Male) Treating RN: Primary Care Physician: PATIENT, NO Other Clinician: Referring Physician: Treating Physician/Extender: BURNS III, WALTER Weeks in Treatment: 3 Subjective Chief Complaint Information obtained from Patient Chronic left calf traumatic ulcer (healed). Left third toe ulcer. New right calf ulcer. History of Present Illness (HPI) Pleasant 53 year old with history of diabetes (Hgb A1c 10.8 in 2014) and peripheral neuropathy. Status post right great toe partial amputation years ago. He was at work and on 10/22/2014 was injured by a cart, and suffered an ulceration to his left anterior calf. He says that it subsequently became infected, and he was treated with a course of antibiotics. He reports progressive improvement. Performing dressing changes with mupirocin. He was found on initial exam to have an ulceration on the dorsum of his left third toe. He was unaware of this and attributes it to pressure from his steel toed boots. He returns to clinic for follow-up and says that he injured his right anterior calf on a cart. He  denies any pain. No ischemic rest pain or claudication. Left ABI 1.1. Ambulating normally per his baseline. No fever or chills. No significant drainage. Objective Constitutional Pulse regular. Respirations normal and unlabored. Afebrile. Vitals Time Taken: 3:25 PM, Height: 70 in, Weight: 245 lbs, BMI: 35.2, Temperature: 98.4 F, Pulse: 93 bpm, Respiratory Rate: 20 breaths/min, Blood Pressure: 142/74 mmHg. Respiratory WNL. No retractions.. Cardiovascular Pedal Pulses WNL. Psychiatric Judgement and insight Intact.. Oriented times 3.. No evidence of depression, anxiety, or agitation.Marland Kitchen JERMOND, BURKEMPER ([id number]) General Notes: Left anterior calf ulcer re-epithelialized. No drainage. No evidence for infection. No significant edema. Left third toe ulcer dorsally much improved. No evidence for infection. No significant edema. New right anterior calf ulcer. Partial-thickness. Biofilm wine free with gauze.  No surgical debridement required. No evidence for infection. Palpable DP. Left ABI 1.1. Integumentary (Hair, Skin) Wound #2 status is Open. Original cause of wound was Footwear Injury. The wound is located on the Left Toe Third. The wound measures 0.1cm length x 0.2cm width x 0.1cm depth; 0.016cm^2 area and 0.002cm^3 volume. The wound is limited to skin breakdown. There is no tunneling or undermining noted. There is a none present amount of drainage noted. The wound margin is distinct with the outline attached to the wound base. There is no granulation within the wound bed. There is a large (67-100%) amount of necrotic tissue within the wound bed. The periwound skin appearance exhibited: Dry/Scaly. The periwound skin appearance did not exhibit: Callus, Crepitus, Excoriation, Fluctuance, Friable, Induration, Localized Edema, Rash, Scarring, Maceration, Moist, Atrophie Blanche, Cyanosis, Ecchymosis, Hemosiderin Staining, Mottled, Pallor, Rubor, Erythema. Periwound temperature was noted as No  Abnormality. Wound #3 status is Open. Original cause of wound was Trauma. The wound is located on the Right,Anterior Lower Leg. The wound measures 0.6cm length x 0.9cm width x 0.1cm depth; 0.424cm^2 area and 0.042cm^3 volume. The wound is limited to skin breakdown. There is no tunneling or undermining noted. There is a small amount of serosanguineous drainage noted. The wound margin is indistinct and nonvisible. There is medium (34-66%) red, pink granulation within the wound bed. There is a small (1-33%) amount of necrotic tissue within the wound bed including Adherent Slough. The periwound skin appearance exhibited: Moist, Hemosiderin Staining. The periwound skin appearance did not exhibit: Callus, Crepitus, Excoriation, Fluctuance, Friable, Induration, Localized Edema, Rash, Scarring, Dry/Scaly, Maceration, Atrophie Blanche, Cyanosis, Ecchymosis, Mottled, Pallor, Rubor, Erythema. Periwound temperature was noted as No Abnormality. Assessment Active Problems ICD-10 L97.222 - Non-pressure chronic ulcer of left calf with fat layer exposed E11.621 - Type 2 diabetes mellitus with foot ulcer E11.43 - Type 2 diabetes mellitus with diabetic autonomic (poly)neuropathy S81.801A - Unspecified open wound, right lower leg, initial encounter Healed left calf traumatic ulceration. Left third toe ulcer almost healed. New right calf traumatic ulceration. RAKEEN, GAILLARD ([id number]) Plan Wound Cleansing: Wound #2 Left Toe Third: Clean wound with Normal Saline. Skin Barriers/Peri-Wound Care: Wound #2 Left Toe Third: Moisturizing lotion Primary Wound Dressing: Wound #2 Left Toe Third: Bactroban - once out of bactroban use neosporin Wound #3 Right,Anterior Lower Leg: Bactroban - once out of bactroban use neosporin Secondary Dressing: Wound #2 Left Toe Third: Gauze and Kerlix/Conform Wound #3 Right,Anterior Lower Leg: Gauze and Kerlix/Conform Dressing Change Frequency: Wound #2 Left Toe  Third: Change dressing every day. Follow-up Appointments: Wound #2 Left Toe Third: Return Appointment in 2 weeks. Edema Control: Wound #3 Right,Anterior Lower Leg: Other: - ACE wrap Medications-please add to medication list.: Wound #2 Left Toe Third: Topical Antibiotic - bactroban Wound #3 Right,Anterior Lower Leg: Topical Antibiotic - bactroban Mupirocin cream. Ace wrap for edema control. Offloading. Electronic Signature(s) Signed: 12/07/2014 3:55:41 PM By: Loletha Grayer MD Entered By: Loletha Grayer on 12/07/2014 15:55:05 [name] ([id number]) -------------------------------------------------------------------------------- SuperBill Details Patient Name: [name]. Date of Service: 12/07/2014 Medical Record Number: [id number] Patient Account Number: [id number] Date of Birth/Sex: 10-Oct-1960 (54 y.o. Male) Treating RN: Primary Care Physician: PATIENT, NO Other Clinician: Referring Physician: Treating Physician/Extender: BURNS III, WALTER Weeks in Treatment: 3 Diagnosis Coding ICD-10 Codes Code Description [phone] Non-pressure chronic ulcer of left calf with fat layer exposed E11.621 Type 2 diabetes mellitus with foot ulcer E11.43 Type 2 diabetes mellitus with diabetic autonomic (poly)neuropathy S81.801A Unspecified open wound, right lower  leg, initial encounter Facility Procedures CPT4 Code: 57017793 Description: [place] VISIT-LEV 3 EST PT Modifier: Quantity: 1 Physician Procedures CPT4 Code: 9030092 Description: 33007 - WC PHYS LEVEL 3 - EST PT ICD-10 Description Diagnosis S81.801A Unspecified open wound, right lower leg, initial Modifier: encounter Quantity: 1 Electronic Signature(s) Signed: 12/07/2014 3:55:41 PM By: Loletha Grayer MD Entered By: Loletha Grayer on 12/07/2014 15:55:20

## 2014-12-21 ENCOUNTER — Encounter: Payer: Worker's Compensation | Admitting: Surgery

## 2014-12-21 DIAGNOSIS — S81801A Unspecified open wound, right lower leg, initial encounter: Secondary | ICD-10-CM | POA: Diagnosis not present

## 2014-12-22 NOTE — Progress Notes (Signed)
[name], [name] ([id number]) Visit Report for 12/21/2014 Chief Complaint Document Details Patient Name: [name], [name]. Date of Service: 12/21/2014 3:15 PM Medical Record Number: [id number] Patient Account Number: [id number] Date of Birth/Sex: [birth year] (54 y.o. Male) Treating RN: Primary Care Physician: PATIENT, NO Other Clinician: Referring Physician: Treating Physician/Extender: BURNS III, Tanaka Gillen Weeks in Treatment: 5 Information Obtained from: Patient Chief Complaint Chronic left calf traumatic ulcer (healed). Left third toe ulcer (healed). New right calf ulcer. Electronic Signature(s) Signed: 12/21/2014 4:08:11 PM By: Loletha Grayer MD Entered By: Loletha Grayer on 12/21/2014 15:55:45 [name] ([id number]) -------------------------------------------------------------------------------- HPI Details Patient Name: [name], [name]. Date of Service: 12/21/2014 3:15 PM Medical Record Number: [id number] Patient Account Number: [id number] Date of Birth/Sex: January 06, 1961 (54 y.o. Male) Treating RN: Primary Care Physician: PATIENT, NO Other Clinician: Referring Physician: Treating Physician/Extender: BURNS III, Arbell Wycoff Weeks in Treatment: 5 History of Present Illness HPI Description: Pleasant 54 year old with history of diabetes (Hgb A1c 10.8 in 2014) and peripheral neuropathy. No PVD. L ABI 1.1. Status post right great toe partial amputation years ago. He was at work and on 10/22/2014, was injured by a cart, and suffered an ulceration to his left anterior calf. He says that it subsequently became infected, and he was treated with a course of antibiotics. He was found on initial exam to have an ulceration on the dorsum of his left third toe. He was unaware of this and attributes it to pressure from his steel toed boots. More recently he injured his right anterior calf on a cart. Ambulating normally per his baseline. He has been undergoing regular debridements, applying  mupirocin cream, and an Ace wrap for edema control. He returns to clinic for follow-up and is without complaints. No pain. No fever or chills. No drainage. Electronic Signature(s) Signed: 12/21/2014 4:08:11 PM By: Loletha Grayer MD Entered By: Loletha Grayer on 12/21/2014 15:59:10 [name] ([id number]) -------------------------------------------------------------------------------- Physical Exam Details Patient Name: [name], [name]. Date of Service: 12/21/2014 3:15 PM Medical Record Number: [id number] Patient Account Number: [id number] Date of Birth/Sex: 1960-08-26 (54 y.o. Male) Treating RN: Primary Care Physician: PATIENT, NO Other Clinician: Referring Physician: Treating Physician/Extender: BURNS III, Quaniyah Bugh Weeks in Treatment: 5 Constitutional . Pulse regular. Respirations normal and unlabored. Afebrile. . Notes Left anterior calf and third toe ulcers remain healed. More recent right anterior calf ulcer his re- epithelialized. No drainage. No cellulitis. Nontender. Palpable DP. Left ABI 1.1. Right ABI 1.09. Electronic Signature(s) Signed: 12/21/2014 4:08:11 PM By: Loletha Grayer MD Entered By: Loletha Grayer on 12/21/2014 16:01:10 [name] ([id number]) -------------------------------------------------------------------------------- Physician Orders Details Patient Name: [name], [name]. Date of Service: 12/21/2014 3:15 PM Medical Record Number: [id number] Patient Account Number: [id number] Date of Birth/Sex: April 03, 1961 (54 y.o. Male) Treating RN: Montey Hora Primary Care Physician: PATIENT, NO Other Clinician: Referring Physician: Treating Physician/Extender: BURNS III, Charlean Sanfilippo in Treatment: 5 Verbal / Phone Orders: Yes Clinician: Montey Hora Read Back and Verified: Yes Diagnosis Coding Discharge From [facility] Services o Discharge from [place] Signature(s) Signed: 12/21/2014 4:08:11 PM By: Loletha Grayer  MD Signed: 12/21/2014 5:19:58 PM By: Montey Hora Entered By: Montey Hora on 12/21/2014 15:51:28 [name] ([id number]) -------------------------------------------------------------------------------- Problem List Details Patient Name: [name], [name]. Date of Service: 12/21/2014 3:15 PM Medical Record Number: [id number] Patient Account Number: [id number] Date of Birth/Sex: 03/01/1961 (54 y.o. Male) Treating RN: Primary Care Physician: PATIENT, NO Other Clinician: Referring Physician: Treating Physician/Extender: BURNS III, Norris Brumbach Weeks in Treatment:  5 Active Problems ICD-10 Encounter Code Description Active Date Diagnosis L97.222 Non-pressure chronic ulcer of left calf with fat layer 11/16/2014 Yes exposed E11.621 Type 2 diabetes mellitus with foot ulcer 11/16/2014 Yes E11.43 Type 2 diabetes mellitus with diabetic autonomic (poly) 11/16/2014 Yes neuropathy S81.801A Unspecified open wound, right lower leg, initial encounter 12/07/2014 Yes Inactive Problems Resolved Problems Electronic Signature(s) Signed: 12/21/2014 4:08:11 PM By: Loletha Grayer MD Entered By: Loletha Grayer on 12/21/2014 15:55:28 [name] ([id number]) -------------------------------------------------------------------------------- Progress Note Details Patient Name: [name]. Date of Service: 12/21/2014 3:15 PM Medical Record Number: [id number] Patient Account Number: [id number] Date of Birth/Sex: 05/13/1961 (54 y.o. Male) Treating RN: Primary Care Physician: PATIENT, NO Other Clinician: Referring Physician: Treating Physician/Extender: BURNS III, Fleda Pagel Weeks in Treatment: 5 Subjective Chief Complaint Information obtained from Patient Chronic left calf traumatic ulcer (healed). Left third toe ulcer (healed). New right calf ulcer. History of Present Illness (HPI) Pleasant 54 year old with history of diabetes (Hgb A1c 10.8 in 2014) and peripheral neuropathy. No PVD. L ABI 1.1.  Status post right great toe partial amputation years ago. He was at work and on 10/22/2014, was injured by a cart, and suffered an ulceration to his left anterior calf. He says that it subsequently became infected, and he was treated with a course of antibiotics. He was found on initial exam to have an ulceration on the dorsum of his left third toe. He was unaware of this and attributes it to pressure from his steel toed boots. More recently he injured his right anterior calf on a cart. Ambulating normally per his baseline. He has been undergoing regular debridements, applying mupirocin cream, and an Ace wrap for edema control. He returns to clinic for follow-up and is without complaints. No pain. No fever or chills. No drainage. Objective Constitutional Pulse regular. Respirations normal and unlabored. Afebrile. Vitals Time Taken: 3:34 PM, Height: 70 in, Weight: 245 lbs, BMI: 35.2, Temperature: 98.2 F, Pulse: 101 bpm, Respiratory Rate: 18 breaths/min, Blood Pressure: 133/78 mmHg. General Notes: Left anterior calf and third toe ulcers remain healed. More recent right anterior calf ulcer his re-epithelialized. No drainage. No cellulitis. Nontender. Palpable DP. Left ABI 1.1. Right ABI 1.09. Integumentary (Hair, Skin) Wound #2 status is Open. Original cause of wound was Footwear Injury. The wound is located on the Left Toe Third. The wound measures 0cm length x 0cm width x 0cm depth; 0cm^2 area and 0cm^3 volume. [name], [name] ([id number]) Wound #3 status is Healed - Epithelialized. Original cause of wound was Trauma. The wound is located on the Right,Anterior Lower Leg. The wound measures 0cm length x 0cm width x 0cm depth; 0cm^2 area and 0cm^3 volume. The wound is limited to skin breakdown. There is no tunneling or undermining noted. There is a small amount of serosanguineous drainage noted. The wound margin is indistinct and nonvisible. There is small (1-33%) red, pink granulation within  the wound bed. There is a medium (34-66%) amount of necrotic tissue within the wound bed including Adherent Slough. The periwound skin appearance exhibited: Moist, Hemosiderin Staining. The periwound skin appearance did not exhibit: Callus, Crepitus, Excoriation, Fluctuance, Friable, Induration, Localized Edema, Rash, Scarring, Dry/Scaly, Maceration, Atrophie Blanche, Cyanosis, Ecchymosis, Mottled, Pallor, Rubor, Erythema. Periwound temperature was noted as No Abnormality. Assessment Active Problems ICD-10 L97.222 - Non-pressure chronic ulcer of left calf with fat layer exposed E11.621 - Type 2 diabetes mellitus with foot ulcer E11.43 - Type 2 diabetes mellitus with diabetic autonomic (poly)neuropathy S81.801A - Unspecified open wound, right  lower leg, initial encounter Healed bilateral calf and left toe ulcers. Plan Discharge From [facility] Services: Discharge from [place] I reassured him that everything looks good. I encouraged him to call with any questions or concerns. Otherwise, we will plan on seeing him back in the wound clinic on a when necessary basis. Electronic Signature(s) Signed: 12/21/2014 4:08:11 PM By: Loletha Grayer MD [name], [name] ([id number]) Entered By: Loletha Grayer on 12/21/2014 16:01:57 [name] ([id number]) -------------------------------------------------------------------------------- SuperBill Details Patient Name: [name], [name]. Date of Service: 12/21/2014 Medical Record Number: [id number] Patient Account Number: [id number] Date of Birth/Sex: 02-05-1961 (54 y.o. Male) Treating RN: Primary Care Physician: PATIENT, NO Other Clinician: Referring Physician: Treating Physician/Extender: BURNS III, Kemya Shed Weeks in Treatment: 5 Diagnosis Coding ICD-10 Codes Code Description [phone] Non-pressure chronic ulcer of left calf with fat layer exposed E11.621 Type 2 diabetes mellitus with foot ulcer E11.43 Type 2 diabetes mellitus with  diabetic autonomic (poly)neuropathy S81.801D Unspecified open wound, right lower leg, subsequent encounter Facility Procedures CPT4 Code: 30051102 Description: 11173 - WOUND CARE VISIT-LEV 2 EST PT Modifier: Quantity: 1 Physician Procedures CPT4 Code Description: [id number] 03013 - WC PHYS LEVEL 2 - EST PT ICD-10 Description Diagnosis S81.801D Unspecified open wound, right lower leg, subsequen Modifier: t encounter Quantity: 1 Electronic Signature(s) Signed: 12/21/2014 4:08:11 PM By: Loletha Grayer MD Entered By: Loletha Grayer on 12/21/2014 16:02:37

## 2014-12-22 NOTE — Progress Notes (Signed)
[name] ([id number]) Visit Report for 12/21/2014 Arrival Information Details Patient Name: [name], [name]. Date of Service: 12/21/2014 3:15 PM Medical Record Number: [id number] Patient Account Number: [id number] Date of Birth/Sex: [birth year] (54 y.o. Male) Treating RN: [name] Primary Care Physician: PATIENT, NO Other Clinician: Referring Physician: Treating Physician/Extender: [name] Weeks in Treatment: 5 Visit Information History Since Last Visit Added or deleted any medications: No Patient Arrived: Ambulatory Any new allergies or adverse reactions: No Arrival Time: 15:34 Had a fall or experienced change in No Accompanied By: self activities of daily living that may affect Transfer Assistance: None risk of falls: Patient Identification Verified: Yes Signs or symptoms of abuse/neglect since last No Secondary Verification Process Yes visito Completed: Hospitalized since last visit: No Patient Has Alerts: Yes Pain Present Now: No Patient Alerts: DMII ABI L 1.1 R 1.09 Electronic Signature(s) Signed: 12/21/2014 5:19:58 PM By: [name] Entered By: [name] on 12/21/2014 15:35:14 [name] ([id number]) -------------------------------------------------------------------------------- Clinic Level of Care Assessment Details Patient Name: [name]. Date of Service: 12/21/2014 3:15 PM Medical Record Number: [id number] Patient Account Number: [id number] Date of Birth/Sex: December 24, 1960 (54 y.o. Male) Treating RN: [name] Primary Care Physician: PATIENT, NO Other Clinician: Referring Physician: Treating Physician/Extender: [name] Weeks in Treatment: 5 Clinic Level of Care Assessment Items TOOL 4 Quantity Score []  - Use when only an EandM is performed on FOLLOW-UP visit 0 ASSESSMENTS - Nursing Assessment / Reassessment X - Reassessment of Co-morbidities (includes updates in patient status) 1 10 X - Reassessment of  Adherence to Treatment Plan 1 5 ASSESSMENTS - Wound and Skin Assessment / Reassessment []  - Simple Wound Assessment / Reassessment - one wound 0 X - Complex Wound Assessment / Reassessment - multiple wounds 2 5 []  - Dermatologic / Skin Assessment (not related to wound area) 0 ASSESSMENTS - Focused Assessment X - Circumferential Edema Measurements - multi extremities 1 5 []  - Nutritional Assessment / Counseling / Intervention 0 X - Lower Extremity Assessment (monofilament, tuning fork, pulses) 1 5 []  - Peripheral Arterial Disease Assessment (using hand held doppler) 0 ASSESSMENTS - Ostomy and/or Continence Assessment and Care []  - Incontinence Assessment and Management 0 []  - Ostomy Care Assessment and Management (repouching, etc.) 0 PROCESS - Coordination of Care X - Simple Patient / Family Education for ongoing care 1 15 []  - Complex (extensive) Patient / Family Education for ongoing care 0 []  - Staff obtains Programmer, systems, Records, Test Results / Process Orders 0 []  - Staff telephones HHA, Nursing Homes / Clarify orders / etc 0 []  - Routine Transfer to another Facility (non-emergent condition) 0 [name], [name] ([id number]) []  - Routine Hospital Admission (non-emergent condition) 0 []  - New Admissions / Biomedical engineer / Ordering NPWT, Apligraf, etc. 0 []  - Emergency Hospital Admission (emergent condition) 0 X - Simple Discharge Coordination 1 10 []  - Complex (extensive) Discharge Coordination 0 PROCESS - Special Needs []  - Pediatric / Minor Patient Management 0 []  - Isolation Patient Management 0 []  - Hearing / Language / Visual special needs 0 []  - Assessment of Community assistance (transportation, D/C planning, etc.) 0 []  - Additional assistance / Altered mentation 0 []  - Support Surface(s) Assessment (bed, cushion, seat, etc.) 0 INTERVENTIONS - Wound Cleansing / Measurement []  - Simple Wound Cleansing - one wound 0 []  - Complex Wound Cleansing - multiple wounds 0 X -  Wound Imaging (photographs - any number of wounds) 1 5 []  - Wound Tracing (instead of photographs) 0 []  -  Simple Wound Measurement - one wound 0 []  - Complex Wound Measurement - multiple wounds 0 INTERVENTIONS - Wound Dressings []  - Small Wound Dressing one or multiple wounds 0 []  - Medium Wound Dressing one or multiple wounds 0 []  - Large Wound Dressing one or multiple wounds 0 []  - Application of Medications - topical 0 []  - Application of Medications - injection 0 INTERVENTIONS - Miscellaneous []  - External ear exam 0 [name], COCUZZA. ([id number]) []  - Specimen Collection (cultures, biopsies, blood, body fluids, etc.) 0 []  - Specimen(s) / Culture(s) sent or taken to Lab for analysis 0 []  - Patient Transfer (multiple staff / Harrel Lemon Lift / Similar devices) 0 []  - Simple Staple / Suture removal (25 or less) 0 []  - Complex Staple / Suture removal (26 or more) 0 []  - Hypo / Hyperglycemic Management (close monitor of Blood Glucose) 0 []  - Ankle / Brachial Index (ABI) - do not check if billed separately 0 X - Vital Signs 1 5 Has the patient been seen at the hospital within the last three years: Yes Total Score: 70 Level Of Care: New/Established - Level 2 Electronic Signature(s) Signed: 12/21/2014 5:19:58 PM By: [name] Entered By: [name] on 12/21/2014 15:52:07 [name] ([id number]) -------------------------------------------------------------------------------- Encounter Discharge Information Details Patient Name: [name]. Date of Service: 12/21/2014 3:15 PM Medical Record Number: [id number] Patient Account Number: [id number] Date of Birth/Sex: 1960/12/01 (54 y.o. Male) Treating RN: Primary Care Physician: PATIENT, NO Other Clinician: Referring Physician: Treating Physician/Extender: [name] Weeks in Treatment: 5 Encounter Discharge Information Items Schedule Follow-up Appointment: No Medication Reconciliation completed No and provided to  Patient/Care Marcos Ruelas: Provided on Clinical Summary of Care: 12/21/2014 Form Type Recipient Paper Patient HP Electronic Signature(s) Signed: 12/21/2014 3:52:26 PM By: Ruthine Dose Entered By: Ruthine Dose on 12/21/2014 15:52:26 [name] ([id number]) -------------------------------------------------------------------------------- Lower Extremity Assessment Details Patient Name: [name]. Date of Service: 12/21/2014 3:15 PM Medical Record Number: [id number] Patient Account Number: [id number] Date of Birth/Sex: 08-30-1960 (54 y.o. Male) Treating RN: [name] Primary Care Physician: PATIENT, NO Other Clinician: Referring Physician: Treating Physician/Extender: [name] Weeks in Treatment: 5 Edema Assessment Assessed: [Left: No] [Right: No] E[Left: dema] [Right: :] Calf Left: Right: Point of Measurement: 38 cm From Medial Instep cm 38.1 cm Ankle Left: Right: Point of Measurement: 11 cm From Medial Instep cm 23.8 cm Vascular Assessment Pulses: Posterior Tibial Palpable: [Right:Yes] Dorsalis Pedis Palpable: [Right:Yes] Extremity colors, hair growth, and conditions: Extremity Color: [Right:Normal] Hair Growth on Extremity: [Right:Yes] Temperature of Extremity: [Right:Warm] Capillary Refill: [Right:< 3 seconds] Toe Nail Assessment Left: Right: Thick: Yes Discolored: Yes Deformed: Yes Improper Length and Hygiene: No Electronic Signature(s) Signed: 12/21/2014 5:19:58 PM By: [name] Entered By: [name] on 12/21/2014 15:41:40 [name] ([id number] [name], [name] ([id number]) -------------------------------------------------------------------------------- Multi Wound Chart Details Patient Name: [name]. Date of Service: 12/21/2014 3:15 PM Medical Record Number: [id number] Patient Account Number: [id number] Date of Birth/Sex: 10-21-1960 (54 y.o. Male) Treating RN: [name] Primary Care Physician: PATIENT,  NO Other Clinician: Referring Physician: Treating Physician/Extender: [name] Weeks in Treatment: 5 Vital Signs Height(in): 70 Pulse(bpm): 101 Weight(lbs): 245 Blood Pressure 133/78 (mmHg): Body Mass Index(BMI): 35 Temperature(F): 98.2 Respiratory Rate 18 (breaths/min): Photos: [2:No Photos] [3:No Photos] [N/A:N/A] Wound Location: [2:Left Toe Third] [3:Right, Anterior Lower Leg] [N/A:N/A] Wounding Event: [2:Footwear Injury] [3:Trauma] [N/A:N/A] Primary Etiology: [2:Diabetic Wound/Ulcer of the Lower Extremity] [3:Trauma, Other] [N/A:N/A] Date Acquired: [2:11/16/2014] [3:11/24/2014] [N/A:N/A] Weeks of Treatment: [  2:5] [3:2] [N/A:N/A] Wound Status: [2:Open] [3:Open] [N/A:N/A] Measurements L x W x D 0x0x0 [3:0.2x0.4x0.1] [N/A:N/A] (cm) Area (cm) : [2:0] [3:0.063] [N/A:N/A] Volume (cm) : [2:0] [5:6.387] [N/A:N/A] % Reduction in Area: [2:100.00%] [3:85.10%] [N/A:N/A] % Reduction in Volume: 100.00% [3:85.70%] [N/A:N/A] Classification: [2:Grade 1] [3:Full Thickness Without Exposed Support Structures] [N/A:N/A] Periwound Skin Texture: No Abnormalities Noted [3:No Abnormalities Noted] [N/A:N/A] Periwound Skin [2:No Abnormalities Noted] [3:No Abnormalities Noted] [N/A:N/A] Moisture: Periwound Skin Color: No Abnormalities Noted [3:No Abnormalities Noted] [N/A:N/A] Tenderness on [2:No] [3:No] [N/A:N/A] Treatment Notes Electronic Signature(s) Signed: 12/21/2014 5:19:58 PM By: [name] ([id number]) Entered By: [name] on 12/21/2014 15:44:14 [name] ([id number]) -------------------------------------------------------------------------------- [place] Details Patient Name: [name], WHITTER. Date of Service: 12/21/2014 3:15 PM Medical Record Number: [id number] Patient Account Number: [id number] Date of Birth/Sex: 08/29/1960 (54 y.o. Male) Treating RN: [name] Primary Care Physician: PATIENT, NO Other  Clinician: Referring Physician: Treating Physician/Extender: [name] Weeks in Treatment: 5 Active Inactive Electronic Signature(s) Signed: 12/21/2014 4:38:20 PM By: [name] Entered By: [name] on 12/21/2014 16:38:19 [name] ([id number]) -------------------------------------------------------------------------------- Patient/Caregiver Education Details Patient Name: [name]. Date of Service: 12/21/2014 3:15 PM Medical Record Number: [id number] Patient Account Number: [id number] Date of Birth/Gender: [birth year] (54 y.o. Male) Treating RN: [name] Primary Care Physician: PATIENT, NO Other Clinician: Referring Physician: Treating Physician/Extender: [name] III, Charlean Sanfilippo in Treatment: 5 Education Assessment Education Provided To: Patient Education Topics Provided Wound/Skin Impairment: Handouts: Other: return if needed Methods: Explain/Verbal Responses: State content correctly Electronic Signature(s) Signed: 12/21/2014 5:19:58 PM By: [name] Entered By: [name] on 12/21/2014 15:52:49 [name] ([id number]) -------------------------------------------------------------------------------- Wound Assessment Details Patient Name: [name]. Date of Service: 12/21/2014 3:15 PM Medical Record Number: [id number] Patient Account Number: [id number] Date of Birth/Sex: 06-Dec-1960 (54 y.o. Male) Treating RN: [name] Primary Care Physician: PATIENT, NO Other Clinician: Referring Physician: Treating Physician/Extender: [name] Weeks in Treatment: 5 Wound Status Wound Number: 2 Primary Diabetic Wound/Ulcer of the Lower Etiology: Extremity Wound Location: Left Toe Third Wound Status: Open Wounding Event: Footwear Injury Date Acquired: 11/16/2014 Weeks Of Treatment: 5 Clustered Wound: No Photos Photo Uploaded By: [name] on 12/21/2014 17:25:56 Wound Measurements Length: (cm) 0 % Reduction  i Width: (cm) 0 % Reduction i Depth: (cm) 0 Area: (cm) 0 Volume: (cm) 0 n Area: 100% n Volume: 100% Wound Description Classification: Grade 1 Periwound Skin Texture Texture Color No Abnormalities Noted: No No Abnormalities Noted: No Moisture No Abnormalities Noted: No Electronic Signature(s) Signed: 12/21/2014 5:19:58 PM By: [name] ([id number]) Entered By: [name] on 12/21/2014 15:42:24 [name] ([id number]) -------------------------------------------------------------------------------- Wound Assessment Details Patient Name: [name]. Date of Service: 12/21/2014 3:15 PM Medical Record Number: [id number] Patient Account Number: [id number] Date of Birth/Sex: 03-Nov-1960 (54 y.o. Male) Treating RN: [name] Primary Care Physician: PATIENT, NO Other Clinician: Referring Physician: Treating Physician/Extender: [name] Weeks in Treatment: 5 Wound Status Wound Number: 3 Primary Trauma, Other Etiology: Wound Location: Right, Anterior Lower Leg Wound Status: Healed - Epithelialized Wounding Event: Trauma Comorbid Hypertension, Type II Diabetes, Date Acquired: 11/24/2014 History: Neuropathy Weeks Of Treatment: 2 Clustered Wound: No Photos Photo Uploaded By: [name] on 12/21/2014 17:25:34 Wound Measurements Length: (cm) 0 % Reduction Width: (cm) 0 % Reduction Depth: (cm) 0 Epithelializ Area: (cm) 0 Tunneling: Volume: (cm) 0 Undermining in Area: 100% in Volume: 100% ation: Medium (34-66%) No : No Wound Description Full Thickness Without  Exposed Foul Odor A Classification: Support Structures Diabetic Severity Grade 1 (Wagner): Wound Margin: Indistinct, nonvisible Exudate Amount: Small Exudate Type: Serosanguineous Exudate Color: red, brown fter Cleansing: No Wound Bed Granulation Amount: Small (1-33%) Exposed Structure Granulation Quality: Red, Pink Fascia Exposed: No [name], FATZINGER.  ([id number]) Necrotic Amount: Medium (34-66%) Fat Layer Exposed: No Necrotic Quality: Adherent Slough Tendon Exposed: No Muscle Exposed: No Joint Exposed: No Bone Exposed: No Limited to Skin Breakdown Periwound Skin Texture Texture Color No Abnormalities Noted: No No Abnormalities Noted: No Callus: No Atrophie Blanche: No Crepitus: No Cyanosis: No Excoriation: No Ecchymosis: No Fluctuance: No Erythema: No Friable: No Hemosiderin Staining: Yes Induration: No Mottled: No Localized Edema: No Pallor: No Rash: No Rubor: No Scarring: No Temperature / Pain Moisture Temperature: No Abnormality No Abnormalities Noted: No Dry / Scaly: No Maceration: No Moist: Yes Wound Preparation Ulcer Cleansing: Rinsed/Irrigated with Saline Topical Anesthetic Applied: Other: Lidocaine 4% Ointment, Electronic Signature(s) Signed: 12/21/2014 5:19:58 PM By: [name] Previous Signature: 12/21/2014 3:46:54 PM Version By: [name] Entered By: [name] on 12/21/2014 15:49:08 [name] ([id number]) -------------------------------------------------------------------------------- [name] Details Patient Name: [name]. Date of Service: 12/21/2014 3:15 PM Medical Record Number: [id number] Patient Account Number: [id number] Date of Birth/Sex: 25-Aug-1960 (54 y.o. Male) Treating RN: [name] Primary Care Physician: PATIENT, NO Other Clinician: Referring Physician: Treating Physician/Extender: [name] Weeks in Treatment: 5 Vital Signs Time Taken: 15:34 Temperature (F): 98.2 Height (in): 70 Pulse (bpm): 101 Weight (lbs): 245 Respiratory Rate (breaths/min): 18 Body Mass Index (BMI): 35.2 Blood Pressure (mmHg): 133/78 Reference Range: 80 - 120 mg / dl Electronic Signature(s) Signed: 12/21/2014 5:19:58 PM By: [name] Entered By: [name] on 12/21/2014 15:35:47

## 2015-01-27 ENCOUNTER — Encounter (INDEPENDENT_AMBULATORY_CARE_PROVIDER_SITE_OTHER): Payer: Self-pay | Admitting: Ophthalmology

## 2015-02-06 ENCOUNTER — Emergency Department: Payer: Managed Care, Other (non HMO)

## 2015-02-06 ENCOUNTER — Emergency Department
Admission: EM | Admit: 2015-02-06 | Discharge: 2015-02-06 | Disposition: A | Discharge status: Home / Self Care | Payer: Managed Care, Other (non HMO) | Attending: Emergency Medicine | Admitting: Emergency Medicine

## 2015-02-06 ENCOUNTER — Encounter: Payer: Self-pay | Admitting: Emergency Medicine

## 2015-02-06 DIAGNOSIS — Y9389 Activity, other specified: Secondary | ICD-10-CM | POA: Diagnosis not present

## 2015-02-06 DIAGNOSIS — Y9289 Other specified places as the place of occurrence of the external cause: Secondary | ICD-10-CM | POA: Diagnosis not present

## 2015-02-06 DIAGNOSIS — I10 Essential (primary) hypertension: Secondary | ICD-10-CM | POA: Insufficient documentation

## 2015-02-06 DIAGNOSIS — S8011XA Contusion of right lower leg, initial encounter: Secondary | ICD-10-CM

## 2015-02-06 DIAGNOSIS — Z7982 Long term (current) use of aspirin: Secondary | ICD-10-CM | POA: Diagnosis not present

## 2015-02-06 DIAGNOSIS — E119 Type 2 diabetes mellitus without complications: Secondary | ICD-10-CM | POA: Insufficient documentation

## 2015-02-06 DIAGNOSIS — Z794 Long term (current) use of insulin: Secondary | ICD-10-CM | POA: Diagnosis not present

## 2015-02-06 DIAGNOSIS — S8991XA Unspecified injury of right lower leg, initial encounter: Secondary | ICD-10-CM | POA: Diagnosis present

## 2015-02-06 DIAGNOSIS — Y998 Other external cause status: Secondary | ICD-10-CM | POA: Insufficient documentation

## 2015-02-06 DIAGNOSIS — Y9301 Activity, walking, marching and hiking: Secondary | ICD-10-CM | POA: Diagnosis not present

## 2015-02-06 DIAGNOSIS — Z79899 Other long term (current) drug therapy: Secondary | ICD-10-CM | POA: Diagnosis not present

## 2015-02-06 DIAGNOSIS — W010XXA Fall on same level from slipping, tripping and stumbling without subsequent striking against object, initial encounter: Secondary | ICD-10-CM | POA: Insufficient documentation

## 2015-02-06 MED ORDER — KETOROLAC TROMETHAMINE 60 MG/2ML IM SOLN
60.0000 mg | Freq: Once | INTRAMUSCULAR | Status: AC
  Administered 2015-02-06: 60 mg via INTRAMUSCULAR
  Filled 2015-02-06: qty 2

## 2015-02-06 MED ORDER — IBUPROFEN 800 MG PO TABS: 800.0000 mg | ORAL_TABLET | Freq: Three times a day (TID) | ORAL | Status: DC | PRN

## 2015-02-06 MED ORDER — OXYCODONE-ACETAMINOPHEN 5-325 MG PO TABS: 1.0000 | ORAL_TABLET | ORAL | Status: DC | PRN

## 2015-02-06 MED ORDER — OXYCODONE-ACETAMINOPHEN 5-325 MG PO TABS
2.0000 | ORAL_TABLET | Freq: Once | ORAL | Status: AC
  Administered 2015-02-06: 2 via ORAL
  Filled 2015-02-06: qty 2

## 2015-02-06 NOTE — Discharge Instructions (Signed)
Contusion °A contusion is a deep bruise. Contusions are the result of an injury that caused bleeding under the skin. The contusion may turn blue, purple, or yellow. Minor injuries will give you a painless contusion, but more severe contusions may stay painful and swollen for a few weeks.  °CAUSES  °A contusion is usually caused by a blow, trauma, or direct force to an area of the body. °SYMPTOMS  °· Swelling and redness of the injured area. °· Bruising of the injured area. °· Tenderness and soreness of the injured area. °· Pain. °DIAGNOSIS  °The diagnosis can be made by taking a history and physical exam. An X-ray, CT scan, or MRI may be needed to determine if there were any associated injuries, such as fractures. °TREATMENT  °Specific treatment will depend on what area of the body was injured. In general, the best treatment for a contusion is resting, icing, elevating, and applying cold compresses to the injured area. Over-the-counter medicines may also be recommended for pain control. Ask your caregiver what the best treatment is for your contusion. °HOME CARE INSTRUCTIONS  °· Put ice on the injured area. °¨ Put ice in a plastic bag. °¨ Place a towel between your skin and the bag. °¨ Leave the ice on for 15-20 minutes, 3-4 times a day, or as directed by your health care provider. °· Only take over-the-counter or prescription medicines for pain, discomfort, or fever as directed by your caregiver. Your caregiver may recommend avoiding anti-inflammatory medicines (aspirin, ibuprofen, and naproxen) for 48 hours because these medicines may increase bruising. °· Rest the injured area. °· If possible, elevate the injured area to reduce swelling. °SEEK IMMEDIATE MEDICAL CARE IF:  °· You have increased bruising or swelling. °· You have pain that is getting worse. °· Your swelling or pain is not relieved with medicines. °MAKE SURE YOU:  °· Understand these instructions. °· Will watch your condition. °· Will get help right  away if you are not doing well or get worse. °Document Released: 03/27/2005 Document Revised: 06/22/2013 Document Reviewed: 04/22/2011 °[employer]® Patient Information ©2015 [employer]. This information is not intended to replace advice given to you by your health care provider. Make sure you discuss any questions you have with your health care provider. ° °

## 2015-02-06 NOTE — ED Provider Notes (Signed)
[facility] Emergency Department Provider Note  ____________________________________________  Time seen: Approximately 5:12 PM  I have reviewed the triage vital signs and the nursing notes.   HISTORY  Chief Complaint Leg Pain and Fall    HPI [name] is a 54 y.o. male who presents with complaints of right upper leg pain. Patient states that he was walking on crutches and slipped on the floor injuring his right upper leg. Past medical history significant for fractured left leg.   Past Medical History  Diagnosis Date  . Diabetes mellitus   . Osteomyelitis of toe of right foot   . Hypertension     There are no active problems to display for this patient.   Past Surgical History  Procedure Laterality Date  . Patella reconstruction  2005  . Prostate ablation  2014    Current Outpatient Rx  Name  Route  Sig  Dispense  Refill  . aspirin 81 MG tablet   Oral   Take 81 mg by mouth daily.         Marland Kitchen ibuprofen (ADVIL,MOTRIN) 800 MG tablet   Oral   Take 1 tablet (800 mg total) by mouth every 8 (eight) hours as needed.   30 tablet   0   . insulin aspart (NOVOLOG) 100 UNIT/ML injection   Subcutaneous   Inject into the skin 3 (three) times daily before meals.         . insulin detemir (LEVEMIR) 100 UNIT/ML injection   Subcutaneous   Inject 28 Units into the skin at bedtime.         . multivitamin-iron-minerals-folic acid (CENTRUM) chewable tablet   Oral   Chew 1 tablet by mouth daily.         . mupirocin cream (BACTROBAN) 2 %   Topical   Apply 1 application topically 2 (two) times daily.   15 g   0   . olmesartan-hydrochlorothiazide (BENICAR HCT) 40-25 MG per tablet   Oral   Take 1 tablet by mouth daily.         Marland Kitchen oxyCODONE-acetaminophen (ROXICET) 5-325 MG per tablet   Oral   Take 1-2 tablets by mouth every 4 (four) hours as needed for severe pain.   15 tablet   0   . tamsulosin (FLOMAX) 0.4 MG CAPS capsule   Oral  Take 0.4 mg by mouth.           Allergies Review of patient's allergies indicates no known allergies.  No family history on file.  Social History History  Substance Use Topics  . Smoking status: Never Smoker   . Smokeless tobacco: Not on file  . Alcohol Use: No    Review of Systems Constitutional: No fever/chills Eyes: No visual changes. ENT: No sore throat. Cardiovascular: Denies chest pain. Respiratory: Denies shortness of breath. Gastrointestinal: No abdominal pain.  No nausea, no vomiting.  No diarrhea.  No constipation. Genitourinary: Negative for dysuria. Musculoskeletal: Positive for right upper leg pain. Skin: Negative for rash. Neurological: Negative for headaches, focal weakness or numbness.  10-point ROS otherwise negative.  ____________________________________________   PHYSICAL EXAM:  VITAL SIGNS: ED Triage Vitals  Enc Vitals Group     BP 02/06/15 1636 130/57 mmHg     Pulse Rate 02/06/15 1636 97     Resp 02/06/15 1636 18     Temp 02/06/15 1636 98 F (36.7 C)     Temp Source 02/06/15 1636 Oral     SpO2 02/06/15 1636 100 %  Weight 02/06/15 1636 250 lb (113.399 kg)     Height 02/06/15 1636 5\' 10"  (1.778 m)     Head Cir --      Peak Flow --      Pain Score 02/06/15 1637 9     Pain Loc --      Pain Edu? --      Excl. in [place]? --     Constitutional: Alert and oriented. Well appearing and in no acute distress. Eyes: Conjunctivae are normal. PERRL. EOMI. Head: Atraumatic. Nose: No congestion/rhinnorhea. Mouth/Throat: Mucous membranes are moist.  Oropharynx non-erythematous. Neck: No stridor.   Cardiovascular: Normal rate, regular rhythm. Grossly normal heart sounds.  Good peripheral circulation. Respiratory: Normal respiratory effort.  No retractions. Lungs CTAB. Gastrointestinal: Soft and nontender. No distention. No abdominal bruits. No CVA tenderness. Musculoskeletal: No lower extremity tenderness nor edema.  No joint effusions. Positive  right femur pain, increased laterally. Full range of motion lower extremity able to flex knee with some difficulty.. Neurologic:  Normal speech and language. No gross focal neurologic deficits are appreciated. No gait instability. Skin:  Skin is warm, dry and intact. No rash noted. Psychiatric: Mood and affect are normal. Speech and behavior are normal.  ____________________________________________   LABS (all labs ordered are listed, but only abnormal results are displayed)  Labs Reviewed - No data to display ____________________________________________    RADIOLOGY  Right femur negative interpreted by radiologist and reviewed by myself. ____________________________________________   PROCEDURES  Procedure(s) performed: None  Critical Care performed: No  ____________________________________________   INITIAL IMPRESSION / ASSESSMENT AND PLAN / ED COURSE  Pertinent labs & imaging results that were available during my care of the patient were reviewed by me and considered in my medical decision making (see chart for details).  Right leg contusion. Rx given for Percocet 11/01/2023 for 3 days. Continue Motrin 800 mg 3 times a day follow up with PCP or orthopedic doctor as directed. Patient voices no other emergency medical complaints at this time. ____________________________________________   FINAL CLINICAL IMPRESSION(S) / ED DIAGNOSES  Final diagnoses:  Contusion of right leg, initial encounter      Arlyss Repress, PA-C 02/06/15 1754  Ponciano Ort, MD 02/07/15 0005

## 2015-02-06 NOTE — ED Notes (Signed)
Pt to ED with c/o right upper leg pain, pt has fx to left leg that is casted and states he was ambulating with crutches today on a wet floor and the crutches gave away and he fell onto right upper leg area

## 2015-02-08 ENCOUNTER — Encounter (INDEPENDENT_AMBULATORY_CARE_PROVIDER_SITE_OTHER): Payer: Managed Care, Other (non HMO) | Admitting: Ophthalmology

## 2015-02-08 DIAGNOSIS — H43813 Vitreous degeneration, bilateral: Secondary | ICD-10-CM | POA: Diagnosis not present

## 2015-02-08 DIAGNOSIS — E11331 Type 2 diabetes mellitus with moderate nonproliferative diabetic retinopathy with macular edema: Secondary | ICD-10-CM

## 2015-02-08 DIAGNOSIS — I10 Essential (primary) hypertension: Secondary | ICD-10-CM

## 2015-02-08 DIAGNOSIS — D3131 Benign neoplasm of right choroid: Secondary | ICD-10-CM | POA: Diagnosis not present

## 2015-02-08 DIAGNOSIS — H2513 Age-related nuclear cataract, bilateral: Secondary | ICD-10-CM | POA: Diagnosis not present

## 2015-02-08 DIAGNOSIS — H35033 Hypertensive retinopathy, bilateral: Secondary | ICD-10-CM | POA: Diagnosis not present

## 2015-02-08 DIAGNOSIS — E11319 Type 2 diabetes mellitus with unspecified diabetic retinopathy without macular edema: Secondary | ICD-10-CM

## 2015-02-10 ENCOUNTER — Encounter (INDEPENDENT_AMBULATORY_CARE_PROVIDER_SITE_OTHER): Payer: Managed Care, Other (non HMO) | Admitting: Ophthalmology

## 2015-05-23 ENCOUNTER — Emergency Department: Payer: Managed Care, Other (non HMO)

## 2015-05-23 ENCOUNTER — Emergency Department
Admission: EM | Admit: 2015-05-23 | Discharge: 2015-05-23 | Disposition: A | Discharge status: Home / Self Care | Payer: Managed Care, Other (non HMO) | Attending: Emergency Medicine | Admitting: Emergency Medicine

## 2015-05-23 ENCOUNTER — Encounter: Payer: Self-pay | Admitting: Emergency Medicine

## 2015-05-23 DIAGNOSIS — Z794 Long term (current) use of insulin: Secondary | ICD-10-CM | POA: Diagnosis not present

## 2015-05-23 DIAGNOSIS — E119 Type 2 diabetes mellitus without complications: Secondary | ICD-10-CM | POA: Insufficient documentation

## 2015-05-23 DIAGNOSIS — Z7982 Long term (current) use of aspirin: Secondary | ICD-10-CM | POA: Insufficient documentation

## 2015-05-23 DIAGNOSIS — I10 Essential (primary) hypertension: Secondary | ICD-10-CM | POA: Insufficient documentation

## 2015-05-23 DIAGNOSIS — S39012A Strain of muscle, fascia and tendon of lower back, initial encounter: Secondary | ICD-10-CM | POA: Diagnosis not present

## 2015-05-23 DIAGNOSIS — Z79899 Other long term (current) drug therapy: Secondary | ICD-10-CM | POA: Insufficient documentation

## 2015-05-23 DIAGNOSIS — M79672 Pain in left foot: Secondary | ICD-10-CM | POA: Insufficient documentation

## 2015-05-23 DIAGNOSIS — Y9289 Other specified places as the place of occurrence of the external cause: Secondary | ICD-10-CM | POA: Insufficient documentation

## 2015-05-23 DIAGNOSIS — Y998 Other external cause status: Secondary | ICD-10-CM | POA: Insufficient documentation

## 2015-05-23 DIAGNOSIS — X58XXXA Exposure to other specified factors, initial encounter: Secondary | ICD-10-CM | POA: Insufficient documentation

## 2015-05-23 DIAGNOSIS — Y9389 Activity, other specified: Secondary | ICD-10-CM | POA: Insufficient documentation

## 2015-05-23 DIAGNOSIS — S3992XA Unspecified injury of lower back, initial encounter: Secondary | ICD-10-CM | POA: Diagnosis present

## 2015-05-23 MED ORDER — TRAMADOL HCL 50 MG PO TABS: 50.0000 mg | ORAL_TABLET | Freq: Four times a day (QID) | ORAL | Status: DC | PRN

## 2015-05-23 MED ORDER — METHOCARBAMOL 750 MG PO TABS: 1500.0000 mg | ORAL_TABLET | Freq: Four times a day (QID) | ORAL | Status: DC

## 2015-05-23 NOTE — ED Notes (Signed)
States he developed pain to mid back which radiates into lower back. Doesn't radiate into legs  Ambulates well

## 2015-05-23 NOTE — ED Provider Notes (Signed)
[facility] Emergency Department Provider Note  ____________________________________________  Time seen: Approximately 10:26 AM  I have reviewed the triage vital signs and the nursing notes.   HISTORY  Chief Complaint Back Pain    HPI [name] is a 54 y.o. male patient complaining of 4 days of mid back pain. Patient denies radicular component to this pain. Patient denies any bladder or bowel dysfunction. Patient stated he had a colonoscopy4 days ago but had back pain before the procedure. Patient states no position get some comfort. Patient states no palliative measures taken for this complaint. Patient also states in the past 3 months he's been wearing a walking boot secondary to left fractured foot. Patient rates his back pain as a 9/10. Describes pain as sharp.  Past Medical History  Diagnosis Date  . Diabetes mellitus   . Osteomyelitis of toe of right foot ([place])   . Hypertension     There are no active problems to display for this patient.   Past Surgical History  Procedure Laterality Date  . Patella reconstruction  2005  . Prostate ablation  2014    Current Outpatient Rx  Name  Route  Sig  Dispense  Refill  . aspirin 81 MG tablet   Oral   Take 81 mg by mouth daily.         Marland Kitchen ibuprofen (ADVIL,MOTRIN) 800 MG tablet   Oral   Take 1 tablet (800 mg total) by mouth every 8 (eight) hours as needed.   30 tablet   0   . insulin aspart (NOVOLOG) 100 UNIT/ML injection   Subcutaneous   Inject into the skin 3 (three) times daily before meals.         . insulin detemir (LEVEMIR) 100 UNIT/ML injection   Subcutaneous   Inject 28 Units into the skin at bedtime.         . methocarbamol (ROBAXIN-750) 750 MG tablet   Oral   Take 2 tablets (1,500 mg total) by mouth 4 (four) times daily.   40 tablet   0   . multivitamin-iron-minerals-folic acid (CENTRUM) chewable tablet   Oral   Chew 1 tablet by mouth daily.         . mupirocin cream  (BACTROBAN) 2 %   Topical   Apply 1 application topically 2 (two) times daily.   15 g   0   . olmesartan-hydrochlorothiazide (BENICAR HCT) 40-25 MG per tablet   Oral   Take 1 tablet by mouth daily.         Marland Kitchen oxyCODONE-acetaminophen (ROXICET) 5-325 MG per tablet   Oral   Take 1-2 tablets by mouth every 4 (four) hours as needed for severe pain.   15 tablet   0   . tamsulosin (FLOMAX) 0.4 MG CAPS capsule   Oral   Take 0.4 mg by mouth.         . traMADol (ULTRAM) 50 MG tablet   Oral   Take 1 tablet (50 mg total) by mouth every 6 (six) hours as needed.   20 tablet   0     Allergies Review of patient's allergies indicates no known allergies.  No family history on file.  Social History Social History  Substance Use Topics  . Smoking status: Never Smoker   . Smokeless tobacco: None  . Alcohol Use: No    Review of Systems Constitutional: No fever/chills Eyes: No visual changes. ENT: No sore throat. Cardiovascular: Denies chest pain. Respiratory: Denies shortness  of breath. Gastrointestinal: No abdominal pain.  No nausea, no vomiting.  No diarrhea.  No constipation. Genitourinary: Negative for dysuria. Musculoskeletal: Positive for back pain. Left foot pain Skin: Negative for rash. Neurological: Negative for headaches, focal weakness or numbness. Endocrine: Hypertension and diabetes. 10-point ROS otherwise negative.  ____________________________________________   PHYSICAL EXAM:  VITAL SIGNS: ED Triage Vitals  Enc Vitals Group     BP 05/23/15 0941 156/86 mmHg     Pulse Rate 05/23/15 0941 103     Resp 05/23/15 0941 20     Temp 05/23/15 0941 97.7 F (36.5 C)     Temp Source 05/23/15 0941 Oral     SpO2 05/23/15 0941 96 %     Weight 05/23/15 0941 260 lb (117.935 kg)     Height 05/23/15 0941 5\' 10"  (1.778 m)     Head Cir --      Peak Flow --      Pain Score 05/23/15 0931 9     Pain Loc --      Pain Edu? --      Excl. in [place]? --     Constitutional:  Alert and oriented. Well appearing and in no acute distress. Eyes: Conjunctivae are normal. PERRL. EOMI. Head: Atraumatic. Nose: No congestion/rhinnorhea. Mouth/Throat: Mucous membranes are moist.  Oropharynx non-erythematous. Neck: No stridor.  No cervical spine tenderness to palpation. Hematological/Lymphatic/Immunilogical: No cervical lymphadenopathy. Cardiovascular: Normal rate, regular rhythm. Grossly normal heart sounds.  Good peripheral circulation. Respiratory: Normal respiratory effort.  No retractions. Lungs CTAB. Gastrointestinal: Soft and nontender. No distention. No abdominal bruits. No CVA tenderness. Musculoskeletal: No spinal deformity. Patient has some moderate guarding palpation L2-L4. Patient has decreased range of motion with extension and flexion. Patient per spinal muscle spasm with left lateral movements. Patient is a negative straight leg test. Examination of the left foot was deferred.  Neurologic:  Normal speech and language. No gross focal neurologic deficits are appreciated. No gait instability. Skin:  Skin is warm, dry and intact. No rash noted. Psychiatric: Mood and affect are normal. Speech and behavior are normal.  ____________________________________________   LABS (all labs ordered are listed, but only abnormal results are displayed)  Labs Reviewed - No data to display ____________________________________________  EKG   ____________________________________________  RADIOLOGY  No acute findings on lumbar spine x-ray. I, Sable Feil, personally viewed and evaluated these images (plain radiographs) as part of my medical decision making.   ____________________________________________   PROCEDURES  Procedure(s) performed: None  Critical Care performed: No  ____________________________________________   INITIAL IMPRESSION / ASSESSMENT AND PLAN / ED COURSE  Pertinent labs & imaging results that were available during my care of the patient  were reviewed by me and considered in my medical decision making (see chart for details).  Acute lumbar strain. Discussed x-ray findings with patient. Patient get a prescription for Robaxin and tramadol. Patient [facility]. ____________________________________________   FINAL CLINICAL IMPRESSION(S) / ED DIAGNOSES  Final diagnoses:  Lumbar strain, initial encounter       Sable Feil, PA-C 05/23/15 [place], MD 05/23/15 1259

## 2015-05-23 NOTE — ED Notes (Signed)
Pt to ed with c/o back pain that started on Friday.  Denies injury.

## 2015-06-01 ENCOUNTER — Other Ambulatory Visit: Payer: Self-pay | Admitting: Orthopedic Surgery

## 2015-06-02 ENCOUNTER — Encounter (HOSPITAL_BASED_OUTPATIENT_CLINIC_OR_DEPARTMENT_OTHER): Payer: Self-pay | Admitting: *Deleted

## 2015-06-05 ENCOUNTER — Encounter (HOSPITAL_BASED_OUTPATIENT_CLINIC_OR_DEPARTMENT_OTHER)
Admission: RE | Admit: 2015-06-05 | Discharge: 2015-06-05 | Disposition: A | Discharge status: Home / Self Care | Payer: Managed Care, Other (non HMO) | Source: Ambulatory Visit | Attending: Orthopedic Surgery | Admitting: Orthopedic Surgery

## 2015-06-05 ENCOUNTER — Other Ambulatory Visit: Payer: Self-pay

## 2015-06-05 DIAGNOSIS — Y9389 Activity, other specified: Secondary | ICD-10-CM | POA: Diagnosis not present

## 2015-06-05 DIAGNOSIS — M1990 Unspecified osteoarthritis, unspecified site: Secondary | ICD-10-CM | POA: Diagnosis not present

## 2015-06-05 DIAGNOSIS — E119 Type 2 diabetes mellitus without complications: Secondary | ICD-10-CM | POA: Diagnosis not present

## 2015-06-05 DIAGNOSIS — M868X7 Other osteomyelitis, ankle and foot: Secondary | ICD-10-CM | POA: Diagnosis not present

## 2015-06-05 DIAGNOSIS — Y998 Other external cause status: Secondary | ICD-10-CM | POA: Diagnosis not present

## 2015-06-05 DIAGNOSIS — Z6837 Body mass index (BMI) 37.0-37.9, adult: Secondary | ICD-10-CM | POA: Diagnosis not present

## 2015-06-05 DIAGNOSIS — I10 Essential (primary) hypertension: Secondary | ICD-10-CM | POA: Diagnosis not present

## 2015-06-05 DIAGNOSIS — X58XXXA Exposure to other specified factors, initial encounter: Secondary | ICD-10-CM | POA: Diagnosis not present

## 2015-06-05 DIAGNOSIS — Z7982 Long term (current) use of aspirin: Secondary | ICD-10-CM | POA: Diagnosis not present

## 2015-06-05 DIAGNOSIS — Z794 Long term (current) use of insulin: Secondary | ICD-10-CM | POA: Diagnosis not present

## 2015-06-05 DIAGNOSIS — N400 Enlarged prostate without lower urinary tract symptoms: Secondary | ICD-10-CM | POA: Diagnosis not present

## 2015-06-05 DIAGNOSIS — S92312A Displaced fracture of first metatarsal bone, left foot, initial encounter for closed fracture: Secondary | ICD-10-CM | POA: Diagnosis not present

## 2015-06-05 DIAGNOSIS — Y9289 Other specified places as the place of occurrence of the external cause: Secondary | ICD-10-CM | POA: Diagnosis not present

## 2015-06-05 DIAGNOSIS — G4730 Sleep apnea, unspecified: Secondary | ICD-10-CM | POA: Diagnosis not present

## 2015-06-05 LAB — BASIC METABOLIC PANEL
ANION GAP: 7 (ref 5–15)
BUN: 14 mg/dL (ref 6–20)
CO2: 29 mmol/L (ref 22–32)
Calcium: 9.7 mg/dL (ref 8.9–10.3)
Chloride: 102 mmol/L (ref 101–111)
Creatinine, Ser: 0.91 mg/dL (ref 0.61–1.24)
GLUCOSE: 135 mg/dL — AB (ref 65–99)
POTASSIUM: 4.6 mmol/L (ref 3.5–5.1)
Sodium: 138 mmol/L (ref 135–145)

## 2015-06-08 ENCOUNTER — Ambulatory Visit (HOSPITAL_BASED_OUTPATIENT_CLINIC_OR_DEPARTMENT_OTHER): Payer: Managed Care, Other (non HMO) | Admitting: Certified Registered"

## 2015-06-08 ENCOUNTER — Ambulatory Visit (HOSPITAL_BASED_OUTPATIENT_CLINIC_OR_DEPARTMENT_OTHER)
Admission: RE | Admit: 2015-06-08 | Discharge: 2015-06-08 | Disposition: A | Discharge status: Home / Self Care | Payer: Managed Care, Other (non HMO) | Source: Ambulatory Visit | Attending: Orthopedic Surgery | Admitting: Orthopedic Surgery

## 2015-06-08 ENCOUNTER — Encounter (HOSPITAL_BASED_OUTPATIENT_CLINIC_OR_DEPARTMENT_OTHER)
Admission: RE | Disposition: A | Discharge status: Home / Self Care | Payer: Self-pay | Source: Ambulatory Visit | Attending: Orthopedic Surgery

## 2015-06-08 ENCOUNTER — Encounter (HOSPITAL_BASED_OUTPATIENT_CLINIC_OR_DEPARTMENT_OTHER): Payer: Self-pay | Admitting: *Deleted

## 2015-06-08 DIAGNOSIS — Z6837 Body mass index (BMI) 37.0-37.9, adult: Secondary | ICD-10-CM | POA: Insufficient documentation

## 2015-06-08 DIAGNOSIS — S92312A Displaced fracture of first metatarsal bone, left foot, initial encounter for closed fracture: Secondary | ICD-10-CM | POA: Insufficient documentation

## 2015-06-08 DIAGNOSIS — M1990 Unspecified osteoarthritis, unspecified site: Secondary | ICD-10-CM | POA: Insufficient documentation

## 2015-06-08 DIAGNOSIS — Y9289 Other specified places as the place of occurrence of the external cause: Secondary | ICD-10-CM | POA: Insufficient documentation

## 2015-06-08 DIAGNOSIS — Y9389 Activity, other specified: Secondary | ICD-10-CM | POA: Insufficient documentation

## 2015-06-08 DIAGNOSIS — M868X7 Other osteomyelitis, ankle and foot: Secondary | ICD-10-CM | POA: Insufficient documentation

## 2015-06-08 DIAGNOSIS — G4730 Sleep apnea, unspecified: Secondary | ICD-10-CM | POA: Insufficient documentation

## 2015-06-08 DIAGNOSIS — X58XXXA Exposure to other specified factors, initial encounter: Secondary | ICD-10-CM | POA: Insufficient documentation

## 2015-06-08 DIAGNOSIS — I10 Essential (primary) hypertension: Secondary | ICD-10-CM | POA: Insufficient documentation

## 2015-06-08 DIAGNOSIS — Z7982 Long term (current) use of aspirin: Secondary | ICD-10-CM | POA: Insufficient documentation

## 2015-06-08 DIAGNOSIS — Z794 Long term (current) use of insulin: Secondary | ICD-10-CM | POA: Insufficient documentation

## 2015-06-08 DIAGNOSIS — Y998 Other external cause status: Secondary | ICD-10-CM | POA: Insufficient documentation

## 2015-06-08 DIAGNOSIS — N400 Enlarged prostate without lower urinary tract symptoms: Secondary | ICD-10-CM | POA: Insufficient documentation

## 2015-06-08 DIAGNOSIS — E119 Type 2 diabetes mellitus without complications: Secondary | ICD-10-CM | POA: Insufficient documentation

## 2015-06-08 HISTORY — DX: Unspecified osteoarthritis, unspecified site: M19.90

## 2015-06-08 HISTORY — DX: Sleep apnea, unspecified: G47.30

## 2015-06-08 HISTORY — PX: ORIF TOE FRACTURE: SHX5032

## 2015-06-08 HISTORY — DX: Benign prostatic hyperplasia without lower urinary tract symptoms: N40.0

## 2015-06-08 HISTORY — DX: Fracture of unspecified metatarsal bone(s), unspecified foot, initial encounter for closed fracture: S92.309A

## 2015-06-08 LAB — GLUCOSE, CAPILLARY
Glucose-Capillary: 104 mg/dL — ABNORMAL HIGH (ref 65–99)
Glucose-Capillary: 147 mg/dL — ABNORMAL HIGH (ref 65–99)

## 2015-06-08 SURGERY — OPEN REDUCTION INTERNAL FIXATION (ORIF) METATARSAL (TOE) FRACTURE
Anesthesia: General | Site: Foot | Laterality: Left

## 2015-06-08 MED ORDER — DEXAMETHASONE SODIUM PHOSPHATE 10 MG/ML IJ SOLN
INTRAMUSCULAR | Status: DC | PRN
  Administered 2015-06-08: 4 mg via INTRAVENOUS

## 2015-06-08 MED ORDER — ONDANSETRON HCL 4 MG/2ML IJ SOLN
INTRAMUSCULAR | Status: DC | PRN
  Administered 2015-06-08: 4 mg via INTRAVENOUS

## 2015-06-08 MED ORDER — MIDAZOLAM HCL 2 MG/2ML IJ SOLN
INTRAMUSCULAR | Status: AC
  Filled 2015-06-08: qty 2

## 2015-06-08 MED ORDER — ONDANSETRON HCL 4 MG/2ML IJ SOLN
INTRAMUSCULAR | Status: AC
  Filled 2015-06-08: qty 2

## 2015-06-08 MED ORDER — VANCOMYCIN HCL 500 MG IV SOLR
INTRAVENOUS | Status: DC | PRN
  Administered 2015-06-08: 500 mg

## 2015-06-08 MED ORDER — ASPIRIN EC 325 MG PO TBEC: 325.0000 mg | DELAYED_RELEASE_TABLET | Freq: Every day | ORAL | Status: DC

## 2015-06-08 MED ORDER — SODIUM CHLORIDE 0.9 % IV SOLN: INTRAVENOUS | Status: DC

## 2015-06-08 MED ORDER — EPHEDRINE SULFATE 50 MG/ML IJ SOLN
INTRAMUSCULAR | Status: AC
  Filled 2015-06-08: qty 1

## 2015-06-08 MED ORDER — ONDANSETRON HCL 4 MG/2ML IJ SOLN: 4.0000 mg | Freq: Once | INTRAMUSCULAR | Status: DC | PRN

## 2015-06-08 MED ORDER — LACTATED RINGERS IV SOLN
INTRAVENOUS | Status: DC
  Administered 2015-06-08 (×2): via INTRAVENOUS

## 2015-06-08 MED ORDER — MIDAZOLAM HCL 2 MG/2ML IJ SOLN
1.0000 mg | INTRAMUSCULAR | Status: DC | PRN
Start: 2015-06-08 — End: 2015-06-08
  Administered 2015-06-08: 2 mg via INTRAVENOUS

## 2015-06-08 MED ORDER — PHENYLEPHRINE HCL 10 MG/ML IJ SOLN
INTRAMUSCULAR | Status: DC | PRN
  Administered 2015-06-08: 80 ug via INTRAVENOUS
  Administered 2015-06-08 (×2): 40 ug via INTRAVENOUS

## 2015-06-08 MED ORDER — SCOPOLAMINE 1 MG/3DAYS TD PT72: 1.0000 | MEDICATED_PATCH | Freq: Once | TRANSDERMAL | Status: DC | PRN

## 2015-06-08 MED ORDER — CEFAZOLIN SODIUM-DEXTROSE 2-3 GM-% IV SOLR
2.0000 g | INTRAVENOUS | Status: AC
  Administered 2015-06-08: 2 g via INTRAVENOUS

## 2015-06-08 MED ORDER — FENTANYL CITRATE (PF) 100 MCG/2ML IJ SOLN: 25.0000 ug | INTRAMUSCULAR | Status: DC | PRN

## 2015-06-08 MED ORDER — HYDROMORPHONE HCL 1 MG/ML IJ SOLN: 0.2500 mg | INTRAMUSCULAR | Status: DC | PRN

## 2015-06-08 MED ORDER — VANCOMYCIN HCL 500 MG IV SOLR
INTRAVENOUS | Status: AC
  Filled 2015-06-08: qty 500

## 2015-06-08 MED ORDER — FENTANYL CITRATE (PF) 100 MCG/2ML IJ SOLN
50.0000 ug | INTRAMUSCULAR | Status: DC | PRN
  Administered 2015-06-08: 100 ug via INTRAVENOUS

## 2015-06-08 MED ORDER — 0.9 % SODIUM CHLORIDE (POUR BTL) OPTIME
TOPICAL | Status: DC | PRN
  Administered 2015-06-08: 1000 mL

## 2015-06-08 MED ORDER — OXYCODONE HCL 5 MG PO TABS: 5.0000 mg | ORAL_TABLET | ORAL | Status: DC | PRN

## 2015-06-08 MED ORDER — BUPIVACAINE-EPINEPHRINE (PF) 0.5% -1:200000 IJ SOLN
INTRAMUSCULAR | Status: DC | PRN
  Administered 2015-06-08: 30 mL via PERINEURAL

## 2015-06-08 MED ORDER — SENNA 8.6 MG PO TABS: 2.0000 | ORAL_TABLET | Freq: Two times a day (BID) | ORAL | Status: DC

## 2015-06-08 MED ORDER — PROMETHAZINE HCL 25 MG/ML IJ SOLN: 6.2500 mg | INTRAMUSCULAR | Status: DC | PRN

## 2015-06-08 MED ORDER — EPHEDRINE SULFATE 50 MG/ML IJ SOLN
INTRAMUSCULAR | Status: DC | PRN
  Administered 2015-06-08 (×2): 10 mg via INTRAVENOUS

## 2015-06-08 MED ORDER — LIDOCAINE HCL (CARDIAC) 20 MG/ML IV SOLN
INTRAVENOUS | Status: AC
  Filled 2015-06-08: qty 5

## 2015-06-08 MED ORDER — GLYCOPYRROLATE 0.2 MG/ML IJ SOLN: 0.2000 mg | Freq: Once | INTRAMUSCULAR | Status: DC | PRN

## 2015-06-08 MED ORDER — PHENYLEPHRINE 40 MCG/ML (10ML) SYRINGE FOR IV PUSH (FOR BLOOD PRESSURE SUPPORT)
PREFILLED_SYRINGE | INTRAVENOUS | Status: AC
  Filled 2015-06-08: qty 10

## 2015-06-08 MED ORDER — PROPOFOL 10 MG/ML IV BOLUS
INTRAVENOUS | Status: DC | PRN
Start: 2015-06-08 — End: 2015-06-08
  Administered 2015-06-08: 200 mg via INTRAVENOUS

## 2015-06-08 MED ORDER — FENTANYL CITRATE (PF) 100 MCG/2ML IJ SOLN
INTRAMUSCULAR | Status: AC
  Filled 2015-06-08: qty 2

## 2015-06-08 MED ORDER — DOCUSATE SODIUM 100 MG PO CAPS: 100.0000 mg | ORAL_CAPSULE | Freq: Two times a day (BID) | ORAL | Status: DC

## 2015-06-08 MED ORDER — DEXAMETHASONE SODIUM PHOSPHATE 10 MG/ML IJ SOLN
INTRAMUSCULAR | Status: AC
  Filled 2015-06-08: qty 1

## 2015-06-08 MED ORDER — CHLORHEXIDINE GLUCONATE 4 % EX LIQD: 60.0000 mL | Freq: Once | CUTANEOUS | Status: DC

## 2015-06-08 MED ORDER — CEFAZOLIN SODIUM-DEXTROSE 2-3 GM-% IV SOLR
INTRAVENOUS | Status: AC
  Filled 2015-06-08: qty 50

## 2015-06-08 SURGICAL SUPPLY — 70 items
2.0 DRILL ×2 IMPLANT
BANDAGE ESMARK 6X9 LF (GAUZE/BANDAGES/DRESSINGS) ×1 IMPLANT
BLADE SURG 15 STRL LF DISP TIS (BLADE) ×2 IMPLANT
BLADE SURG 15 STRL SS (BLADE) ×2
BNDG COHESIVE 4X5 TAN STRL (GAUZE/BANDAGES/DRESSINGS) ×2 IMPLANT
BNDG COHESIVE 6X5 TAN STRL LF (GAUZE/BANDAGES/DRESSINGS) IMPLANT
BNDG CONFORM 2 STRL LF (GAUZE/BANDAGES/DRESSINGS) ×2 IMPLANT
BNDG ESMARK 4X9 LF (GAUZE/BANDAGES/DRESSINGS) IMPLANT
BNDG ESMARK 6X9 LF (GAUZE/BANDAGES/DRESSINGS) ×2
CANISTER SUCT 1200ML W/VALVE (MISCELLANEOUS) ×2 IMPLANT
CHLORAPREP W/TINT 26ML (MISCELLANEOUS) ×2 IMPLANT
COVER BACK TABLE 60X90IN (DRAPES) ×2 IMPLANT
CUFF TOURNIQUET SINGLE 34IN LL (TOURNIQUET CUFF) IMPLANT
DECANTER SPIKE VIAL GLASS SM (MISCELLANEOUS) IMPLANT
DRAPE EXTREMITY T 121X128X90 (DRAPE) ×2 IMPLANT
DRAPE OEC MINIVIEW 54X84 (DRAPES) ×2 IMPLANT
DRAPE U-SHAPE 47X51 STRL (DRAPES) ×2 IMPLANT
DRSG MEPITEL 4X7.2 (GAUZE/BANDAGES/DRESSINGS) ×2 IMPLANT
DRSG PAD ABDOMINAL 8X10 ST (GAUZE/BANDAGES/DRESSINGS) ×4 IMPLANT
ELECT REM PT RETURN 9FT ADLT (ELECTROSURGICAL) ×2
ELECTRODE REM PT RTRN 9FT ADLT (ELECTROSURGICAL) ×1 IMPLANT
GAUZE SPONGE 4X4 12PLY STRL (GAUZE/BANDAGES/DRESSINGS) ×2 IMPLANT
GLOVE BIO SURGEON STRL SZ8 (GLOVE) ×2 IMPLANT
GLOVE BIOGEL PI IND STRL 7.0 (GLOVE) ×1 IMPLANT
GLOVE BIOGEL PI IND STRL 8 (GLOVE) ×2 IMPLANT
GLOVE BIOGEL PI INDICATOR 7.0 (GLOVE) ×1
GLOVE BIOGEL PI INDICATOR 8 (GLOVE) ×2
GLOVE ECLIPSE 6.5 STRL STRAW (GLOVE) ×2 IMPLANT
GLOVE ECLIPSE 7.5 STRL STRAW (GLOVE) ×2 IMPLANT
GLOVE EXAM NITRILE MD LF STRL (GLOVE) IMPLANT
GOWN STRL REUS W/ TWL LRG LVL3 (GOWN DISPOSABLE) ×1 IMPLANT
GOWN STRL REUS W/ TWL XL LVL3 (GOWN DISPOSABLE) ×2 IMPLANT
GOWN STRL REUS W/TWL LRG LVL3 (GOWN DISPOSABLE) ×1
GOWN STRL REUS W/TWL XL LVL3 (GOWN DISPOSABLE) ×2
GUIDEWIRE ORTHO MINI ACTK .045 (WIRE) ×2 IMPLANT
NEEDLE HYPO 22GX1.5 SAFETY (NEEDLE) IMPLANT
NS IRRIG 1000ML POUR BTL (IV SOLUTION) ×2 IMPLANT
PACK BASIN DAY SURGERY FS (CUSTOM PROCEDURE TRAY) ×2 IMPLANT
PAD CAST 4YDX4 CTTN HI CHSV (CAST SUPPLIES) ×1 IMPLANT
PADDING CAST ABS 4INX4YD NS (CAST SUPPLIES)
PADDING CAST ABS COTTON 4X4 ST (CAST SUPPLIES) IMPLANT
PADDING CAST COTTON 4X4 STRL (CAST SUPPLIES) ×1
PADDING CAST COTTON 6X4 STRL (CAST SUPPLIES) IMPLANT
PENCIL BUTTON HOLSTER BLD 10FT (ELECTRODE) ×2 IMPLANT
PLATE 3HOLE HOOK (Plate) ×2 IMPLANT
SANITIZER HAND PURELL 535ML FO (MISCELLANEOUS) ×2 IMPLANT
SCREW BONEVA 2.7X20 HEXA (Screw) ×2 IMPLANT
SCREW HEX LOCK 2.7X14MM (Screw) ×2 IMPLANT
SCREW NONLOCK HEX 2.7X12 (Screw) ×2 IMPLANT
SHEET MEDIUM DRAPE 40X70 STRL (DRAPES) ×2 IMPLANT
SLEEVE SCD COMPRESS KNEE MED (MISCELLANEOUS) ×2 IMPLANT
SPLINT FAST PLASTER 5X30 (CAST SUPPLIES)
SPLINT PLASTER CAST FAST 5X30 (CAST SUPPLIES) IMPLANT
SPONGE LAP 18X18 X RAY DECT (DISPOSABLE) ×2 IMPLANT
STOCKINETTE 6  STRL (DRAPES) ×1
STOCKINETTE 6 STRL (DRAPES) ×1 IMPLANT
SUCTION FRAZIER TIP 10 FR DISP (SUCTIONS) IMPLANT
SUT ETHILON 3 0 PS 1 (SUTURE) ×2 IMPLANT
SUT FIBERWIRE #2 38 T-5 BLUE (SUTURE)
SUT MNCRL AB 3-0 PS2 18 (SUTURE) IMPLANT
SUT VIC AB 0 SH 27 (SUTURE) IMPLANT
SUT VIC AB 2-0 SH 27 (SUTURE)
SUT VIC AB 2-0 SH 27XBRD (SUTURE) IMPLANT
SUTURE FIBERWR #2 38 T-5 BLUE (SUTURE) IMPLANT
SYR BULB 3OZ (MISCELLANEOUS) ×2 IMPLANT
SYR CONTROL 10ML LL (SYRINGE) IMPLANT
TOWEL OR 17X24 6PK STRL BLUE (TOWEL DISPOSABLE) ×4 IMPLANT
TUBE CONNECTING 20X1/4 (TUBING) ×2 IMPLANT
UNDERPAD 30X30 (UNDERPADS AND DIAPERS) ×2 IMPLANT
WIRE TACK PLATE PL-PTACK (WIRE) ×2 IMPLANT

## 2015-06-08 NOTE — Anesthesia Postprocedure Evaluation (Signed)
Anesthesia Post Note  Patient: [name]  Procedure(s) Performed: Procedure(s) (LRB): OPEN REDUCTION INTERNAL FIXATION (ORIF) LEFT FIFTH METATARSAL BASE FRACTURE NONUNION; CALCANEAL AUTOGRAFT  (Left)  Patient location during evaluation: PACU Anesthesia Type: General and Regional Level of consciousness: sedated and patient cooperative Pain management: pain level controlled Vital Signs Assessment: post-procedure vital signs reviewed and stable Respiratory status: spontaneous breathing Cardiovascular status: stable Anesthetic complications: no    Last Vitals:  Filed Vitals:   06/08/15 1215 06/08/15 1300  BP: 159/92 163/82  Pulse: 100 100  Temp:  37 C  Resp: 23 16    Last Pain:  Filed Vitals:   06/08/15 1301  PainSc: 0-No pain                 Nolon Nations

## 2015-06-08 NOTE — Anesthesia Preprocedure Evaluation (Addendum)
Anesthesia Evaluation  Patient identified by MRN, date of birth, ID band Patient awake    Reviewed: Allergy & Precautions, NPO status , Patient's Chart, lab work & pertinent test results  History of Anesthesia Complications Negative for: history of anesthetic complications  Airway Mallampati: III  TM Distance: >3 FB Neck ROM: Full    Dental  (+) Dental Advisory Given, Edentulous Upper, Edentulous Lower   Pulmonary sleep apnea and Continuous Positive Airway Pressure Ventilation ,    Pulmonary exam normal        Cardiovascular hypertension, Normal cardiovascular exam     Neuro/Psych negative neurological ROS  negative psych ROS   GI/Hepatic negative GI ROS, Neg liver ROS,   Endo/Other  diabetesMorbid obesity  Renal/GU negative Renal ROS     Musculoskeletal   Abdominal   Peds  Hematology   Anesthesia Other Findings   Reproductive/Obstetrics                          Anesthesia Physical Anesthesia Plan  ASA: III  Anesthesia Plan: General   Post-op Pain Management: GA combined w/ Regional for post-op pain   Induction: Intravenous  Airway Management Planned: LMA  Additional Equipment:   Intra-op Plan:   Post-operative Plan: Extubation in OR  Informed Consent: I have reviewed the patients History and Physical, chart, labs and discussed the procedure including the risks, benefits and alternatives for the proposed anesthesia with the patient or authorized representative who has indicated his/her understanding and acceptance.   Dental advisory given  Plan Discussed with: CRNA, Anesthesiologist and Surgeon  Anesthesia Plan Comments:        Anesthesia Quick Evaluation Lab Results  Component Value Date   WBC 8.0 03/26/2014   HGB 13.0 03/26/2014   HCT 39.7* 03/26/2014   MCV 94 03/26/2014   PLT 202 03/26/2014

## 2015-06-08 NOTE — Progress Notes (Signed)
Assisted Dr. Singer with left, ultrasound guided, popliteal block. Side rails up, monitors on throughout procedure. See vital signs in flow sheet. Tolerated Procedure well. 

## 2015-06-08 NOTE — Brief Op Note (Signed)
06/08/2015  11:51 AM  PATIENT:  [name]  54 y.o. 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  Open treatment of left 5th MT base fracture nonunion 2.  Autograft from left calcaneus to the nonunion site 3.  AP, oblique and lateral xrays of the left foot.   SURGEON:  Wylene Simmer, MD  ASSISTANT: Mechele Claude, PA-C  ANESTHESIA:   General, regional  EBL:  minimal   TOURNIQUET:   Total Tourniquet Time Documented: Thigh (Left) - 60 minutes Total: Thigh (Left) - 60 minutes  COMPLICATIONS:  None apparent  DISPOSITION:  Extubated, awake and stable to recovery.  DICTATION ID:  [id number]

## 2015-06-08 NOTE — Anesthesia Procedure Notes (Addendum)
Anesthesia Regional Block:  Popliteal block  Pre-Anesthetic Checklist: ,, timeout performed, Correct Patient, Correct Site, Correct Laterality, Correct Procedure, Correct Position, site marked, Risks and benefits discussed,  Surgical consent,  Pre-op evaluation,  At surgeon's request and post-op pain management  Laterality: Left  Prep: chloraprep       Needles:  Injection technique: Single-shot  Needle Type: Echogenic Stimulator Needle          Additional Needles:  Procedures: ultrasound guided (picture in chart) and nerve stimulator Popliteal block  Nerve Stimulator or Paresthesia:  Response: plantar flexion, 0.45 mA,   Additional Responses:   Narrative:  Start time: 06/08/2015 9:48 AM End time: 06/08/2015 9:58 AM Injection made incrementally with aspirations every 5 mL.  Performed by: Personally  Anesthesiologist: Duane Boston  Additional Notes: A functioning IV was confirmed and monitors were applied.  Sterile prep and drape, hand hygiene and sterile gloves were used.  Negative aspiration and test dose prior to incremental administration of local anesthetic. The patient tolerated the procedure well.Ultrasound  guidance: relevant anatomy identified, needle position confirmed, local anesthetic spread visualized around nerve(s), vascular puncture avoided.  Image printed for medical record.    Procedure Name: LMA Insertion Date/Time: 06/08/2015 10:08 AM Performed by: Riggs Dineen D Pre-anesthesia Checklist: Patient identified, Emergency Drugs available, Suction available and Patient being monitored Patient Re-evaluated:Patient Re-evaluated prior to inductionOxygen Delivery Method: Circle System Utilized Preoxygenation: Pre-oxygenation with 100% oxygen Intubation Type: IV induction Ventilation: Mask ventilation without difficulty LMA: LMA inserted LMA Size: 4.0 Number of attempts: 1 Airway Equipment and Method: Bite block Placement Confirmation: positive  ETCO2 Tube secured with: Tape Dental Injury: Teeth and Oropharynx as per pre-operative assessment     Procedure Name: LMA Insertion Date/Time: 06/08/2015 10:06 AM Performed by: Cardell Rachel D Pre-anesthesia Checklist: Patient identified, Emergency Drugs available, Suction available and Patient being monitored Patient Re-evaluated:Patient Re-evaluated prior to inductionOxygen Delivery Method: Circle System Utilized Preoxygenation: Pre-oxygenation with 100% oxygen Intubation Type: IV induction Ventilation: Mask ventilation without difficulty LMA: LMA inserted LMA Size: 4.0 Number of attempts: 1 Airway Equipment and Method: Bite block Placement Confirmation: positive ETCO2 Tube secured with: Tape Dental Injury: Teeth and Oropharynx as per pre-operative assessment

## 2015-06-08 NOTE — Transfer of Care (Signed)
Immediate Anesthesia Transfer of Care Note  Patient: [name]  Procedure(s) Performed: Procedure(s): OPEN REDUCTION INTERNAL FIXATION (ORIF) LEFT FIFTH METATARSAL BASE FRACTURE NONUNION; CALCANEAL AUTOGRAFT POSSIBLE (Left)  Patient Location: PACU  Anesthesia Type:GA combined with regional for post-op pain  Level of Consciousness: awake, alert , oriented and patient cooperative  Airway & Oxygen Therapy: Patient Spontanous Breathing and Patient connected to face mask oxygen  Post-op Assessment: Report given to RN and Post -op Vital signs reviewed and stable  Post vital signs: Reviewed and stable  Last Vitals:  Filed Vitals:   06/08/15 0955 06/08/15 0956  BP:    Pulse: 87 87  Temp:    Resp: 19 20    Complications: No apparent anesthesia complications

## 2015-06-08 NOTE — Discharge Instructions (Addendum)
[name] Simmer, MD [place]  Please read the following information regarding your care after surgery.  Medications  You only need a prescription for the narcotic pain medicine (ex. oxycodone, Percocet, Norco).  All of the other medicines listed below are available over the counter. X acetominophen (Tylenol) 650 mg every 4-6 hours as you need for minor pain X oxycodone as prescribed for moderate to severe pain ?   Narcotic pain medicine (ex. oxycodone, Percocet, Vicodin) will cause constipation.  To prevent this problem, take the following medicines while you are taking any pain medicine. X docusate sodium (Colace) 100 mg twice a day X senna (Senokot) 2 tablets twice a day  X To help prevent blood clots, take an aspirin (325 mg) once a day for a month after surgery.  You should also get up every hour while you are awake to move around.    Weight Bearing X Do not bear any weight on the operated leg or foot.  Cast / Splint / Dressing X Keep your splint or cast clean and dry.  Dont put anything (coat hanger, pencil, etc) down inside of it.  If it gets damp, use a hair dryer on the cool setting to dry it.  If it gets soaked, call the office to schedule an appointment for a cast change.   After your dressing, cast or splint is removed; you may shower, but do not soak or scrub the wound.  Allow the water to run over it, and then gently pat it dry.  Swelling It is normal for you to have swelling where you had surgery.  To reduce swelling and pain, keep your toes above your nose for at least 3 days after surgery.  It may be necessary to keep your foot or leg elevated for several weeks.  If it hurts, it should be elevated.  Follow Up Call my office at [phone] when you are discharged from the hospital or surgery center to schedule an appointment to be seen two weeks after surgery.  Call my office at [phone] if you develop a fever >101.5 F, nausea, vomiting, bleeding from  the surgical site or severe pain.     Post Anesthesia Home Care Instructions  Activity: Get plenty of rest for the remainder of the day. A responsible adult should stay with you for 24 hours following the procedure.  For the next 24 hours, DO NOT: -Drive a car -Paediatric nurse -Drink alcoholic beverages -Take any medication unless instructed by your physician -Make any legal decisions or sign important papers.  Meals: Start with liquid foods such as gelatin or soup. Progress to regular foods as tolerated. Avoid greasy, spicy, heavy foods. If nausea and/or vomiting occur, drink only clear liquids until the nausea and/or vomiting subsides. Call your physician if vomiting continues.  Special Instructions/Symptoms: Your throat may feel dry or sore from the anesthesia or the breathing tube placed in your throat during surgery. If this causes discomfort, gargle with warm salt water. The discomfort should disappear within 24 hours.  If you had a scopolamine patch placed behind your ear for the management of post- operative nausea and/or vomiting:  1. The medication in the patch is effective for 72 hours, after which it should be removed.  Wrap patch in a tissue and discard in the trash. Wash hands thoroughly with soap and water. 2. You may remove the patch earlier than 72 hours if you experience unpleasant side effects which may include dry mouth, dizziness or visual disturbances. 3.  Avoid touching the patch. Wash your hands with soap and water after contact with the patch.     Regional Anesthesia Blocks  1. Numbness or the inability to move the "blocked" extremity may last from 3-48 hours after placement. The length of time depends on the medication injected and your individual response to the medication. If the numbness is not going away after 48 hours, call your surgeon.  2. The extremity that is blocked will need to be protected until the numbness is gone and the  Strength has  returned. Because you cannot feel it, you will need to take extra care to avoid injury. Because it may be weak, you may have difficulty moving it or using it. You may not know what position it is in without looking at it while the block is in effect.  3. For blocks in the legs and feet, returning to weight bearing and walking needs to be done carefully. You will need to wait until the numbness is entirely gone and the strength has returned. You should be able to move your leg and foot normally before you try and bear weight or walk. You will need someone to be with you when you first try to ensure you do not fall and possibly risk injury.  4. Bruising and tenderness at the needle site are common side effects and will resolve in a few days.  5. Persistent numbness or new problems with movement should be communicated to the surgeon or the [name] [phone] [place] [phone]).

## 2015-06-08 NOTE — H&P (Signed)
[name] is an 54 y.o. male.   Chief Complaint:  Left foot pain HPI: 54 y/o male with PMH of diabetes injured his L 5th MT several months ago.  He has gone on to nonunion of this Jones fracture and presents now for open treatment with bone grafting.  His Hgb A1C is now down to 7.  Past Medical History  Diagnosis Date  . Diabetes mellitus   . Osteomyelitis of toe of right foot ([place])   . Hypertension   . Sleep apnea     wears CPAP nightly  . Arthritis   . Metatarsal bone fracture     left 5th toe  . BPH (benign prostatic hypertrophy)     Past Surgical History  Procedure Laterality Date  . Patella reconstruction Left 2005  . Prostate ablation  2014  . Toe amputation      partial amputation right great toe    History reviewed. No pertinent family history. Social History:  reports that he has never smoked. He does not have any smokeless tobacco history on file. He reports that he does not drink alcohol or use illicit drugs.  Allergies: No Known Allergies  Medications Prior to Admission  Medication Sig Dispense Refill  . aspirin 81 MG tablet Take 81 mg by mouth daily.    . empagliflozin (JARDIANCE) 25 MG TABS tablet Take 25 mg by mouth daily.    Marland Kitchen ibuprofen (ADVIL,MOTRIN) 800 MG tablet Take 1 tablet (800 mg total) by mouth every 8 (eight) hours as needed. 30 tablet 0  . insulin aspart (NOVOLOG) 100 UNIT/ML injection Inject into the skin 3 (three) times daily before meals.    . Insulin Degludec (TRESIBA FLEXTOUCH) 100 UNIT/ML SOPN Inject 35 Units into the skin at bedtime.    . methocarbamol (ROBAXIN-750) 750 MG tablet Take 2 tablets (1,500 mg total) by mouth 4 (four) times daily. 40 tablet 0  . multivitamin-iron-minerals-folic acid (CENTRUM) chewable tablet Chew 1 tablet by mouth daily.    Marland Kitchen olmesartan-hydrochlorothiazide (BENICAR HCT) 40-25 MG per tablet Take 1 tablet by mouth daily.    . tamsulosin (FLOMAX) 0.4 MG CAPS capsule Take 0.4 mg by mouth.    . traMADol (ULTRAM) 50  MG tablet Take 1 tablet (50 mg total) by mouth every 6 (six) hours as needed. 20 tablet 0    No results found for this or any previous visit (from the past 48 hour(s)). No results found.  ROS  No recent f/c/n/v/wt loss  Blood pressure 155/84, pulse 85, temperature 98 F (36.7 C), temperature source Oral, resp. rate 18, height 5\' 10"  (1.778 m), weight 117.992 kg (260 lb 2 oz), SpO2 99 %. Physical Exam  wn wd male in nad.  A and O x 4.  Mood and affect normal.  EOMi.  Resp unlabored.  L foot with healthy and intact skin.  No lymphadenopathy.  5/5 strength in PF and DF of the ankle and toes.  Sens to LT intact in the sural n dist.  TTP at the 5th MT base.  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  To OR for open treatment of the nonunion with calcaneal bone grafting.  The risks and benefits of the alternative treatment options have been discussed in detail.  The patient wishes to proceed with surgery and specifically understands risks of bleeding, infection, nerve damage, blood clots, need for additional surgery, amputation and death.   Wylene Simmer 06-24-2015, 9:37 AM

## 2015-06-09 ENCOUNTER — Encounter (HOSPITAL_BASED_OUTPATIENT_CLINIC_OR_DEPARTMENT_OTHER): Payer: Self-pay | Admitting: Orthopedic Surgery

## 2015-06-09 NOTE — Op Note (Signed)
[name], [name] NO.:  [id number]  MEDICAL RECORD NO.:  [id number]  LOCATION:                               FACILITY:  [facility]  PHYSICIAN:  Wylene Simmer, MD        DATE OF BIRTH:  December 20, 1960  DATE OF PROCEDURE:  06/08/2015 DATE OF DISCHARGE:  06/08/2015                              OPERATIVE REPORT   PREOPERATIVE DIAGNOSIS:  Left first metatarsal base fracture nonunion.  POSTOPERATIVE DIAGNOSIS:  Left first metatarsal base fracture nonunion.  PROCEDURES: 1. Open treatment of left fifth metatarsal base fracture nonunion. 2. Autograft from the left calcaneus to the nonunion site. 3. AP, oblique and lateral radiographs of the left foot.  SURGEON:  Wylene Simmer, MD  ASSISTANT:  Mechele Claude, PA-C.  ANESTHESIA:  General, regional.  ESTIMATED BLOOD LOSS:  Minimal.  TOURNIQUET TIME:  60 minutes at 250 mmHg.  COMPLICATIONS:  None apparent.  DISPOSITION:  Extubated, awake, and stable to recovery.  INDICATIONS FOR PROCEDURE:  The patient is a 54 year old male with past medical history significant for type 2 diabetes.  He fractured his fifth metatarsal about 6 months ago.  He came to see me about 3 months ago with a nonunion of his Jones fracture.  His hemoglobin A1c at that point was quite elevated.  He worked diligently and lowered it to around 7. He presents now for operative treatment of this painful nonunion.  He understands the risks and benefits, the alternative treatment options and elects surgical treatment.  He specifically understands risks of bleeding, infection, nerve damage, blood clots, need for additional surgery, persistent nonunion, amputation and death.  PROCEDURE IN DETAIL:  After preoperative consent was obtained and the correct operative site was identified, the patient was brought to the operating room and placed supine on the operating table.  General anesthesia was induced.  Preoperative antibiotics were administered. Surgical  time-out was taken.  Left lower extremity was prepped and draped in standard sterile fashion with tourniquet around the thigh. Extremity was exsanguinated and the tourniquet was inflated to 250 mmHg. Longitudinal incision was then made along the base of the fifth metatarsal.  Sharp dissection was carried down through skin and subcutaneous tissue.  The nonunion site was identified.  The fibrous tissue was excised with a #15 scalpel.  The ends of bone were then curetted and the remaining fibrous tissue removed with a rongeur.  The wound was irrigated copiously.  A 2-mm drill bit was then used to perforate the sclerotic bone on both sides of the nonunion site.  At this point, attention was turned to the lateral aspect of the heel where an oblique incision was made.  Sharp dissection was carried down to the periosteum, which was incised.  An 8-mm trephine was utilized to harvest autograft calcaneal bone.  This was packed into the nonunion site, filling it appropriately.  Gelfoam was placed in the hole on the calcaneus.  Once the autograft was packed into the nonunion site, two small drill holes were made in the cortical bone.  A tenaculum was then used to compress the nonunion site.  A hook plate from [employer] was then placed at the base  of the fifth metatarsal.  It was provisionally pinned into place.  AP and lateral radiographs confirmed appropriate position of the plate.  The most distal hole in the plate was drilled in an eccentric fashion.  Nonlocking screw was inserted and this compression slot was utilized to compress the nonunion site appropriately.  Proximally, an oblique locking screw was inserted into the base of the fifth metatarsal.  Distally, another locking screw was inserted.  AP, oblique and lateral radiographs confirmed appropriate position and length of all hardware and appropriate reduction of the nonunion site with appropriate filling of the defect with autograft bone.   Vancomycin powder was then sprinkled in the base of the wound.  Subcutaneous tissues were approximated with inverted simple sutures of 2-0 Vicryl.  Skin incision was closed with a running 3-0 nylon.  The posterior incision was closed with a horizontal mattress suture of 3-0 nylon.  Sterile dressings were applied followed by well-padded splint.  The patient was awakened from anesthesia and transported to the recovery room in stable condition.  FOLLOWUP PLAN:  The patient will be nonweightbearing on the left lower extremity.  He will follow up with me in the office in 2 weeks for suture removal and conversion to a short-leg cast.  He will take aspirin for DVT prophylaxis.  Mechele Claude, PA-C was present and scrubbed for the duration of the case.  His assistance was essential in positioning of the patient, prepping and draping, gaining and maintaining exposure, performing the operation, closing and dressing the wounds and applying the splint.  RADIOGRAPHS:  AP, oblique, and lateral radiographs of the left foot were obtained intraoperatively.  These show interval reduction and fixation of the fifth metatarsal fracture nonunion.  The nonunion site was appropriately filled with bone graft.  No other acute injuries are noted.     Wylene Simmer, MD     JH/MEDQ  D:  06/08/2015  T:  06/09/2015  Job:  [id number]

## 2015-06-26 ENCOUNTER — Emergency Department
Admission: EM | Admit: 2015-06-26 | Discharge: 2015-06-26 | Disposition: A | Discharge status: Home / Self Care | Payer: Managed Care, Other (non HMO) | Attending: Emergency Medicine | Admitting: Emergency Medicine

## 2015-06-26 ENCOUNTER — Emergency Department: Payer: Managed Care, Other (non HMO)

## 2015-06-26 DIAGNOSIS — L84 Corns and callosities: Secondary | ICD-10-CM | POA: Insufficient documentation

## 2015-06-26 DIAGNOSIS — I10 Essential (primary) hypertension: Secondary | ICD-10-CM | POA: Diagnosis not present

## 2015-06-26 DIAGNOSIS — E119 Type 2 diabetes mellitus without complications: Secondary | ICD-10-CM | POA: Diagnosis not present

## 2015-06-26 DIAGNOSIS — Z23 Encounter for immunization: Secondary | ICD-10-CM | POA: Insufficient documentation

## 2015-06-26 DIAGNOSIS — M79671 Pain in right foot: Secondary | ICD-10-CM | POA: Diagnosis present

## 2015-06-26 MED ORDER — TETANUS-DIPHTH-ACELL PERTUSSIS 5-2.5-18.5 LF-MCG/0.5 IM SUSP
INTRAMUSCULAR | Status: AC
  Administered 2015-06-26: 0.5 mL via INTRAMUSCULAR
  Filled 2015-06-26: qty 0.5

## 2015-06-26 MED ORDER — OXYCODONE-ACETAMINOPHEN 5-325 MG PO TABS: 2.0000 | ORAL_TABLET | Freq: Four times a day (QID) | ORAL | Status: DC | PRN

## 2015-06-26 MED ORDER — CEPHALEXIN 500 MG PO CAPS: 500.0000 mg | ORAL_CAPSULE | Freq: Four times a day (QID) | ORAL | Status: AC

## 2015-06-26 MED ORDER — OXYCODONE-ACETAMINOPHEN 5-325 MG PO TABS
2.0000 | ORAL_TABLET | Freq: Once | ORAL | Status: AC
  Administered 2015-06-26: 2 via ORAL
  Filled 2015-06-26: qty 2

## 2015-06-26 NOTE — ED Notes (Signed)
Pt observed lying in bed  Med administered as ordered  Awaiting xray

## 2015-06-26 NOTE — ED Notes (Signed)
Pt with large ulcer to bottom of right foot; pt has cast of left lower extremity from fracture and surgery; has been having to bear all weight on right foot; pt is diabetic and has already had right great toe partially amputated; pt adds he also has a small sore to right shin area; pt says there has been some drainage from ulcer to bottom of foot

## 2015-06-26 NOTE — ED Provider Notes (Signed)
[facility] Emergency Department Provider Note     Time seen: ----------------------------------------- 8:14 AM on 06/26/2015 -----------------------------------------    I have reviewed the triage vital signs and the nursing notes.   HISTORY  Chief Complaint Foot Pain    HPI [name] is a 54 y.o. male who presents ER for right foot pain. Patient has a cast on his left lower extremity from fracture and surgery. Having to bear weight on his right foot. He is a diabetic and has had part of his right great toe and came in the past. She states his been some drainage from an ulcer on the bottom of his foot. He denies fevers chills or other complaints.   Past Medical History  Diagnosis Date  . Diabetes mellitus   . Osteomyelitis of toe of right foot ([place])   . Hypertension   . Sleep apnea     wears CPAP nightly  . Arthritis   . Metatarsal bone fracture     left 5th toe  . BPH (benign prostatic hypertrophy)     There are no active problems to display for this patient.   Past Surgical History  Procedure Laterality Date  . Patella reconstruction Left 2005  . Prostate ablation  2014  . Toe amputation      partial amputation right great toe  . Orif toe fracture Left 06/08/2015    Procedure: OPEN REDUCTION INTERNAL FIXATION (ORIF) LEFT FIFTH METATARSAL BASE FRACTURE NONUNION; CALCANEAL AUTOGRAFT ;  Surgeon: Wylene Simmer, MD;  Location: [facility];  Service: Orthopedics;  Laterality: Left;    Allergies Review of patient's allergies indicates no known allergies.  Social History Social History  Substance Use Topics  . Smoking status: Never Smoker   . Smokeless tobacco: Not on file  . Alcohol Use: No    Review of Systems Constitutional: Negative for fever. Musculoskeletal: Positive for right foot pain Skin: Positive for sores and wound drainage   ____________________________________________   PHYSICAL EXAM:  VITAL  SIGNS: ED Triage Vitals  Enc Vitals Group     BP 06/26/15 0654 161/88 mmHg     Pulse Rate 06/26/15 0654 91     Resp 06/26/15 0654 18     Temp 06/26/15 0654 98.1 F (36.7 C)     Temp Source 06/26/15 0654 Oral     SpO2 06/26/15 0654 98 %     Weight 06/26/15 0654 255 lb (115.667 kg)     Height 06/26/15 0654 5\' 10"  (1.778 m)     Head Cir --      Peak Flow --      Pain Score 06/26/15 0655 8     Pain Loc --      Pain Edu? --      Excl. in [place]? --     Constitutional: Alert and oriented. Well appearing and in no distress. Musculoskeletal: Patient has a large corn formed on the medial aspect of the plantar surface of the right foot. There is minimal ulceration around the right ankle. There is no active bleeding drainage from the plantar surface of the foot. Neurologic:  Normal speech and language. No gross focal neurologic deficits are appreciated. Speech is normal. No gait instability. Skin:  Skin is warm, dry and intact. No rash noted. Psychiatric: Mood and affect are normal. Speech and behavior are normal. Patient exhibits appropriate insight and judgment. ____________________________________________  ED COURSE:  Pertinent labs & imaging results that were available during my care of the patient  were reviewed by me and considered in my medical decision making (see chart for details). Patient is in no acute distress, will obtain foot x-rays   RADIOLOGY Images were viewed by me  Right foot x-rays IMPRESSION: No acute fracture or subluxation. Probable tissue wound plantar aspect in the region of first metatarsal phalangeal joint. No evidence of osteomyelitis. Status post amputation of distal phalanx great toe. ____________________________________________  FINAL ASSESSMENT AND PLAN  Corn and callus formation  Plan: Patient with labs and imaging as dictated above. Patient be given some pain medicine and Keflex. He is advised to have close follow-up with podiatry to have corn  removal.   Earleen Newport, MD   Earleen Newport, MD 06/26/15 [phone]

## 2015-06-26 NOTE — Discharge Instructions (Signed)
Corns and Calluses Corns are small areas of thickened skin that occur on the top, sides, or tip of a toe. They contain a cone-shaped core with a point that can press on a nerve below. This causes pain. Calluses are areas of thickened skin that can occur anywhere on the body including hands, fingers, palms, soles of the feet, and heels.Calluses are usually larger than corns.  CAUSES  Corns and calluses are caused by rubbing (friction) or pressure, such as from shoes that are too tight or do not fit properly.  RISK FACTORS Corns are more likely to develop in people who have toe deformities, such as hammer toes. Since calluses can occur with friction to any area of the skin, calluses are more likely to develop in people who:   Work with their hands.  Wear shoes that fit poorly, shoes that are too tight, or shoes that are high-heeled.  Have toes deformities. SYMPTOMS Symptoms of a corn or callus include:  A hard growth on the skin.   Pain or tenderness under the skin.   Redness and swelling.   Increased discomfort while wearing tight-fitting shoes. DIAGNOSIS  Corns and calluses may be diagnosed with a medical history and physical exam.  TREATMENT  Corns and calluses may be treated with:  Removing the cause of the friction or pressure. This may include:  Changing your shoes.  Wearing shoe inserts (orthotics) or other protective layers in your shoes, such as a corn pad.  Wearing gloves.  Medicines to help soften skin in the hardened, thickened areas.  Reducing the size of the corn or callus by removing the dead layers of skin.  Antibiotic medicines to treat infection.  Surgery, if a toe deformity is the cause. HOME CARE INSTRUCTIONS   Take medicines only as directed by your health care provider.  If you were prescribed an antibiotic, finish all of it even if you start to feel better.  Wear shoes that fit well. Avoid wearing high-heeled shoes and shoes that are too tight  or too loose.  Wear any padding, protective layers, gloves, or orthotics as directed by your health care provider.  Soak your hands or feet and then use a file or pumice stone to soften your corn or callus. Do this as directed by your health care provider.  Check your corn or callus every day for signs of infection. Watch for:  Redness, swelling, or pain.  Fluid, blood, or pus. SEEK MEDICAL CARE IF:   Your symptoms do not improve with treatment.  You have increased redness, swelling, or pain at the site of your corn or callus.  You have fluid, blood, or pus coming from your corn or callus.  You have new symptoms.   This information is not intended to replace advice given to you by your health care provider. Make sure you discuss any questions you have with your health care provider.   Document Released: 03/23/2004 Document Revised: 11/01/2014 Document Reviewed: 06/13/2014 Elsevier Interactive Patient Education 2016 [employer]  

## 2015-09-20 ENCOUNTER — Emergency Department: Payer: Managed Care, Other (non HMO)

## 2015-09-20 ENCOUNTER — Encounter: Payer: Self-pay | Admitting: Emergency Medicine

## 2015-09-20 ENCOUNTER — Emergency Department
Admission: EM | Admit: 2015-09-20 | Discharge: 2015-09-20 | Disposition: A | Discharge status: Home / Self Care | Payer: Managed Care, Other (non HMO) | Attending: Emergency Medicine | Admitting: Emergency Medicine

## 2015-09-20 DIAGNOSIS — I10 Essential (primary) hypertension: Secondary | ICD-10-CM | POA: Diagnosis not present

## 2015-09-20 DIAGNOSIS — M1990 Unspecified osteoarthritis, unspecified site: Secondary | ICD-10-CM | POA: Insufficient documentation

## 2015-09-20 DIAGNOSIS — Z7984 Long term (current) use of oral hypoglycemic drugs: Secondary | ICD-10-CM | POA: Diagnosis not present

## 2015-09-20 DIAGNOSIS — M869 Osteomyelitis, unspecified: Secondary | ICD-10-CM | POA: Diagnosis not present

## 2015-09-20 DIAGNOSIS — S92352G Displaced fracture of fifth metatarsal bone, left foot, subsequent encounter for fracture with delayed healing: Secondary | ICD-10-CM

## 2015-09-20 DIAGNOSIS — E138 Other specified diabetes mellitus with unspecified complications: Secondary | ICD-10-CM | POA: Diagnosis not present

## 2015-09-20 DIAGNOSIS — X500XXD Overexertion from strenuous movement or load, subsequent encounter: Secondary | ICD-10-CM | POA: Insufficient documentation

## 2015-09-20 DIAGNOSIS — L97529 Non-pressure chronic ulcer of other part of left foot with unspecified severity: Secondary | ICD-10-CM | POA: Insufficient documentation

## 2015-09-20 DIAGNOSIS — M79605 Pain in left leg: Secondary | ICD-10-CM

## 2015-09-20 DIAGNOSIS — N400 Enlarged prostate without lower urinary tract symptoms: Secondary | ICD-10-CM | POA: Insufficient documentation

## 2015-09-20 DIAGNOSIS — E13621 Other specified diabetes mellitus with foot ulcer: Secondary | ICD-10-CM

## 2015-09-20 DIAGNOSIS — Z794 Long term (current) use of insulin: Secondary | ICD-10-CM | POA: Insufficient documentation

## 2015-09-20 DIAGNOSIS — Z79899 Other long term (current) drug therapy: Secondary | ICD-10-CM | POA: Insufficient documentation

## 2015-09-20 DIAGNOSIS — Z7982 Long term (current) use of aspirin: Secondary | ICD-10-CM | POA: Insufficient documentation

## 2015-09-20 LAB — BASIC METABOLIC PANEL
Anion gap: 7 (ref 5–15)
BUN: 22 mg/dL — AB (ref 6–20)
CHLORIDE: 97 mmol/L — AB (ref 101–111)
CO2: 27 mmol/L (ref 22–32)
CREATININE: 1.22 mg/dL (ref 0.61–1.24)
Calcium: 8.8 mg/dL — ABNORMAL LOW (ref 8.9–10.3)
GFR calc non Af Amer: >60 mL/min (ref >60)
Glucose, Bld: 343 mg/dL — ABNORMAL HIGH (ref 65–99)
POTASSIUM: 4.4 mmol/L (ref 3.5–5.1)
Sodium: 131 mmol/L — ABNORMAL LOW (ref 135–145)

## 2015-09-20 LAB — CBC
HEMATOCRIT: 38.7 % — AB (ref 40.0–52.0)
Hemoglobin: 13.2 g/dL (ref 13.0–18.0)
MCH: 31.1 pg (ref 26.0–34.0)
MCHC: 34.1 g/dL (ref 32.0–36.0)
MCV: 91.3 fL (ref 80.0–100.0)
PLATELETS: 200 10*3/uL (ref 150–440)
RBC: 4.24 MIL/uL — AB (ref 4.40–5.90)
RDW: 13.8 % (ref 11.5–14.5)
WBC: 8.7 10*3/uL (ref 3.8–10.6)

## 2015-09-20 MED ORDER — CEPHALEXIN 500 MG PO CAPS: 500.0000 mg | ORAL_CAPSULE | Freq: Four times a day (QID) | ORAL | Status: DC

## 2015-09-20 MED ORDER — HYDROCODONE-ACETAMINOPHEN 5-325 MG PO TABS: 1.0000 | ORAL_TABLET | ORAL | Status: DC | PRN

## 2015-09-20 MED ORDER — DOCUSATE SODIUM 100 MG PO CAPS: ORAL_CAPSULE | ORAL | Status: DC

## 2015-09-20 NOTE — ED Notes (Signed)
Pt here with c/o left foot burning and calf pain that began a few days ago, states he had surgery on his foot in December, moved some furniture by himself and states the pain and burning could be from that also, walked with limp to triage.

## 2015-09-20 NOTE — Discharge Instructions (Signed)
As we discussed, you need to see your podiatrist as soon as possible for further evaluation of the wound on her left foot.  Additionally, we recommend that you see your orthopedic surgeon as soon as possible for evaluation of the broken bone in your left foot; the x-rays taken today suggests that it may not be healing appropriately.  Follow-up with both of these providers at the next available opportunity.

## 2015-09-20 NOTE — ED Provider Notes (Signed)
[facility] Emergency Department Provider Note  ____________________________________________  Time seen: Approximately 6:50 PM  I have reviewed the triage vital signs and the nursing notes.   HISTORY  Chief Complaint Foot Pain and Leg Pain    HPI [name] is a 55 y.o. male with a history of insulin-dependent diabetes and surgery about 3 months ago on his left foot for a nonhealing Jones fracture in which heart murmur was placed who presents with gradual onset of left foot burning pain that is radiating up into his calf.  He reports that he had his cast off about 1 month ago and has gone back to work.  Working makes the pain in his foot worse and rest makes it a little bit better.  He had to move from 1 residence to another which required a lot of lifting and carrying and after that he noticed some "discharge" on his sock from his foot as well as the severe pain.  This discharge or weeping fluid on his sock is been present for at least a week.  He denies any blood.  He states that over the last few days the pain has been radiating up into his calf and making it difficult for him to walk and work.  He has not inspected his foot and has not been back to his podiatrist yet.  I just in [place] looks after his feet and an orthopedic surgeon in [place] performed his foot surgery 3 months ago.   Past Medical History  Diagnosis Date  . Diabetes mellitus   . Osteomyelitis of toe of right foot ([place])   . Hypertension   . Sleep apnea     wears CPAP nightly  . Arthritis   . Metatarsal bone fracture     left 5th toe  . BPH (benign prostatic hypertrophy)     There are no active problems to display for this patient.   Past Surgical History  Procedure Laterality Date  . Patella reconstruction Left 2005  . Prostate ablation  2014  . Toe amputation      partial amputation right great toe  . Orif toe fracture Left 06/08/2015    Procedure: OPEN REDUCTION INTERNAL  FIXATION (ORIF) LEFT FIFTH METATARSAL BASE FRACTURE NONUNION; CALCANEAL AUTOGRAFT ;  Surgeon: Wylene Simmer, MD;  Location: [place];  Service: Orthopedics;  Laterality: Left;    Current Outpatient Rx  Name  Route  Sig  Dispense  Refill  . aspirin EC 325 MG tablet   Oral   Take 1 tablet (325 mg total) by mouth daily.   42 tablet   0   .           .           . empagliflozin (JARDIANCE) 25 MG TABS tablet   Oral   Take 25 mg by mouth daily.         .           . insulin aspart (NOVOLOG) 100 UNIT/ML injection   Subcutaneous   Inject into the skin 3 (three) times daily before meals.         . Insulin Degludec (TRESIBA FLEXTOUCH) 100 UNIT/ML SOPN   Subcutaneous   Inject 35 Units into the skin at bedtime.         . multivitamin-iron-minerals-folic acid (CENTRUM) chewable tablet   Oral   Chew 1 tablet by mouth daily.         Marland Kitchen olmesartan-hydrochlorothiazide (BENICAR  HCT) 40-25 MG per tablet   Oral   Take 1 tablet by mouth daily.         Marland Kitchen oxyCODONE (ROXICODONE) 5 MG immediate release tablet   Oral   Take 1-2 tablets (5-10 mg total) by mouth every 4 (four) hours as needed for moderate pain or severe pain.   30 tablet   0   . oxyCODONE-acetaminophen (PERCOCET) 5-325 MG tablet   Oral   Take 2 tablets by mouth every 6 (six) hours as needed for moderate pain or severe pain.   30 tablet   0   . senna (SENOKOT) 8.6 MG TABS tablet   Oral   Take 2 tablets (17.2 mg total) by mouth 2 (two) times daily.   30 each   0   . tamsulosin (FLOMAX) 0.4 MG CAPS capsule   Oral   Take 0.4 mg by mouth.           Allergies Review of patient's allergies indicates no known allergies.  No family history on file.  Social History Social History  Substance Use Topics  . Smoking status: Never Smoker   . Smokeless tobacco: None  . Alcohol Use: No    Review of Systems Constitutional: No fever/chills Eyes: No visual changes. ENT: No sore  throat. Cardiovascular: Denies chest pain. Respiratory: Denies shortness of breath. Gastrointestinal: No abdominal pain.  No nausea, no vomiting.  No diarrhea.  No constipation. Genitourinary: Negative for dysuria. Musculoskeletal: Severe burning pain in his left foot radiating up into his calf, worse with ambulation Skin: Negative for rash. Neurological: Negative for headaches, focal weakness or numbness.  10-point ROS otherwise negative.  ____________________________________________   PHYSICAL EXAM:  VITAL SIGNS: ED Triage Vitals  Enc Vitals Group     BP 09/20/15 1724 138/85 mmHg     Pulse Rate 09/20/15 1724 103     Resp 09/20/15 1724 18     Temp 09/20/15 1724 98 F (36.7 C)     Temp Source 09/20/15 1724 Oral     SpO2 09/20/15 1724 99 %     Weight 09/20/15 1724 260 lb (117.935 kg)     Height 09/20/15 1724 5\' 10"  (1.778 m)     Head Cir --      Peak Flow --      Pain Score 09/20/15 1725 10     Pain Loc --      Pain Edu? --      Excl. in [place]? --     Constitutional: Alert and oriented. Well appearing and in no acute distress. Eyes: Conjunctivae are normal. PERRL. EOMI. Head: Atraumatic. Nose: No congestion/rhinnorhea. Mouth/Throat: Mucous membranes are moist.  Oropharynx non-erythematous. Neck: No stridor.  No meningeal signs.   Cardiovascular: Normal rate, regular rhythm. Good peripheral circulation. Grossly normal heart sounds.   Respiratory: Normal respiratory effort.  No retractions. Lungs CTAB. Gastrointestinal: Soft and nontender. No distention.  Musculoskeletal: The patient has an open foot ulcer on the lateral aspect of his left midfoot.  There is no evidence of surrounding cellulitis or cellulitis spreading up beyond the foot.  It is tender to palpation.  There is no purulent discharge, just some serous drainage.  There is no gross deformity other than the ulcer.  I took a photo of the ulcer and he can be found under Chart Review on the Media tab. Neurologic:   Normal speech and language. No gross focal neurologic deficits are appreciated.  Skin:  Skin is warm, dry and intact. No rash  noted. Psychiatric: Mood and affect are normal. Speech and behavior are normal.  ____________________________________________   LABS (all labs ordered are listed, but only abnormal results are displayed)  Labs Reviewed  BASIC METABOLIC PANEL - Abnormal; Notable for the following:    Sodium 131 (*)    Chloride 97 (*)    Glucose, Bld 343 (*)    BUN 22 (*)    Calcium 8.8 (*)    All other components within normal limits  CBC - Abnormal; Notable for the following:    RBC 4.24 (*)    HCT 38.7 (*)    All other components within normal limits   ____________________________________________  EKG  None ____________________________________________  RADIOLOGY   US Venous Img Lower Unilateral Left  09/20/2015  CLINICAL DATA:  Left lower extremity pain for 1 month. Color changes in spider veins. Patient was of foot casting tool 4 weeks ago. Pain since then. EXAM: Left LOWER EXTREMITY VENOUS DOPPLER ULTRASOUND TECHNIQUE: Gray-scale sonography with graded compression, as well as color Doppler and duplex ultrasound were performed to evaluate the lower extremity deep venous systems from the level of the common femoral vein and including the common femoral, femoral, profunda femoral, popliteal and calf veins including the posterior tibial, peroneal and gastrocnemius veins when visible. The superficial great saphenous vein was also interrogated. Spectral Doppler was utilized to evaluate flow at rest and with distal augmentation maneuvers in the common femoral, femoral and popliteal veins. COMPARISON:  None. FINDINGS: Contralateral Common Femoral Vein: Respiratory phasicity is normal and symmetric with the symptomatic side. No evidence of thrombus. Normal compressibility. Common Femoral Vein: No evidence of thrombus. Normal compressibility, respiratory phasicity and response to  augmentation. Saphenofemoral Junction: No evidence of thrombus. Normal compressibility and flow on color Doppler imaging. Profunda Femoral Vein: No evidence of thrombus. Normal compressibility and flow on color Doppler imaging. Femoral Vein: No evidence of thrombus. Normal compressibility, respiratory phasicity and response to augmentation. Popliteal Vein: No evidence of thrombus. Normal compressibility, respiratory phasicity and response to augmentation. Calf Veins: No evidence of thrombus. Normal compressibility and flow on color Doppler imaging. Superficial Great Saphenous Vein: No evidence of thrombus. Normal compressibility and flow on color Doppler imaging. Venous Reflux:  None. Other Findings: Incidental note of prominent lymph nodes in the left groin measuring about 8 mm short axis dimension. These are likely reactive. IMPRESSION: No evidence of deep venous thrombosis. Electronically Signed   By: Lucienne Capers M.D.   On: 09/20/2015 18:56   Dg Foot Complete Left  09/20/2015  CLINICAL DATA:  Lateral foot ulcer EXAM: LEFT FOOT - COMPLETE 3+ VIEW COMPARISON:  None. FINDINGS: Plate and screws are present in the fifth metatarsal. A transfix a proximal diaphyseal fracture. The fracture line is still evident. There is sclerosis within the distal fracture fragment adjacent to the fracture site. There is no breakage or loosening of the hardware. There is no obvious gas in the soft tissues. Tiny lytic area within the calcaneus is nonspecific. IMPRESSION: ORIF fifth metatarsal fracture. The fracture line is not healed. Surgery was apparently 3 months ago and this is worrisome for delayed or nonunion. Electronically Signed   By: Marybelle Killings M.D.   On: 09/20/2015 19:17    ____________________________________________   PROCEDURES  Procedure(s) performed: None  Critical Care performed: No ____________________________________________   INITIAL IMPRESSION / ASSESSMENT AND PLAN / ED COURSE  Pertinent  labs & imaging results that were available during my care of the patient were reviewed by me and considered in  my medical decision making (see chart for details).  No evidence of DVT on U/S.  apparently new open wound on his left lateral foot without any evidence of spreading cellulitis.  X-rays demonstrate non-or delayed healing fracture even after surgery.  I will put him on Keflex empirically for his open foot wound but I encouraged him to follow up at the next available opportunity both with his orthopedic surgeon in his podiatrist.  He already has an appointment scheduled for approximately one week with podiatry, but I encouraged him to call tomorrow morning and schedule the next available appointment.  I gave him my usual customary return precautions.  He understands and agrees with the plan.  His vital signs are stable and his tachycardia resolved.  ____________________________________________  FINAL CLINICAL IMPRESSION(S) / ED DIAGNOSES  Final diagnoses:  Diabetic ulcer of left foot associated with other specified diabetes mellitus ([place])  Fracture of fifth metatarsal bone with delayed healing, left      NEW MEDICATIONS STARTED DURING THIS VISIT:  New Prescriptions   CEPHALEXIN (KEFLEX) 500 MG CAPSULE    Take 1 capsule (500 mg total) by mouth 4 (four) times daily.   DOCUSATE SODIUM (COLACE) 100 MG CAPSULE    Take 1 tablet once or twice daily as needed for constipation while taking narcotic pain medicine   HYDROCODONE-ACETAMINOPHEN (NORCO/VICODIN) 5-325 MG TABLET    Take 1-2 tablets by mouth every 4 (four) hours as needed for moderate pain.      Note:  This document was prepared using Dragon voice recognition software and may include unintentional dictation errors.   Hinda Kehr, MD 09/20/15 2045

## 2015-10-02 ENCOUNTER — Emergency Department
Admission: EM | Admit: 2015-10-02 | Discharge: 2015-10-02 | Disposition: A | Discharge status: Home / Self Care | Payer: Managed Care, Other (non HMO) | Attending: Emergency Medicine | Admitting: Emergency Medicine

## 2015-10-02 ENCOUNTER — Encounter: Payer: Self-pay | Admitting: Emergency Medicine

## 2015-10-02 DIAGNOSIS — S91302D Unspecified open wound, left foot, subsequent encounter: Secondary | ICD-10-CM

## 2015-10-02 DIAGNOSIS — Z7982 Long term (current) use of aspirin: Secondary | ICD-10-CM | POA: Diagnosis not present

## 2015-10-02 DIAGNOSIS — Z4801 Encounter for change or removal of surgical wound dressing: Secondary | ICD-10-CM | POA: Diagnosis not present

## 2015-10-02 DIAGNOSIS — Z794 Long term (current) use of insulin: Secondary | ICD-10-CM | POA: Insufficient documentation

## 2015-10-02 DIAGNOSIS — Z79899 Other long term (current) drug therapy: Secondary | ICD-10-CM | POA: Diagnosis not present

## 2015-10-02 DIAGNOSIS — I10 Essential (primary) hypertension: Secondary | ICD-10-CM | POA: Diagnosis not present

## 2015-10-02 DIAGNOSIS — E119 Type 2 diabetes mellitus without complications: Secondary | ICD-10-CM | POA: Diagnosis not present

## 2015-10-02 LAB — GLUCOSE, CAPILLARY: GLUCOSE-CAPILLARY: 217 mg/dL — AB (ref 65–99)

## 2015-10-02 NOTE — ED Notes (Signed)
See triage note  States he had surgery in dec.  Was seen here and by ortho for open wound to left foot. states area opened about 10 days ago. conts to have drainage from foot..just finished a round of keflex

## 2015-10-02 NOTE — Discharge Instructions (Signed)
Clean wound daily with mild soap and water. Change dressing daily. When sitting elevate foot. Also take dressing off to allow air to the wound. Keep your appointment with podiatrist on Friday.

## 2015-10-02 NOTE — ED Notes (Addendum)
Pt has surgery on left foot in December.  Reports wound opened 10 days ago and was seen here.  Pt was given abx and pain medication.  Patient reports has finished abx and wound still getting worse. Only scant discharge noted on dressing when removed, no odor noted at this time. No fevers.  Open wound on foot but no redness extending past wound.

## 2015-10-02 NOTE — ED Provider Notes (Signed)
[facility] Emergency Department Provider Note  ____________________________________________  Time seen: Approximately 9:00 AM  I have reviewed the triage vital signs and the nursing notes.   HISTORY  Chief Complaint Wound Check   HPI [name] is a 55 y.o. male is here for recheck of his left foot. Patient had surgery on his left foot in December and has had an open wound. He was seen in the emergency room on 09/20/15 at which time he is placed on Keflex. Since that time he has seen his podiatrist and has another appointment in 4 days also for recheck. Patient states that he is concerned because dressings were changed last night and there was "discharge" from his foot on the dressing. Patient denies any fever or chills. He states his blood sugars have been running around 150.He rates his pain as 10 over 10.   Past Medical History  Diagnosis Date  . Diabetes mellitus   . Osteomyelitis of toe of right foot ([place])   . Hypertension   . Sleep apnea     wears CPAP nightly  . Arthritis   . Metatarsal bone fracture     left 5th toe  . BPH (benign prostatic hypertrophy)     There are no active problems to display for this patient.   Past Surgical History  Procedure Laterality Date  . Patella reconstruction Left 2005  . Prostate ablation  2014  . Toe amputation      partial amputation right great toe  . Orif toe fracture Left 06/08/2015    Procedure: OPEN REDUCTION INTERNAL FIXATION (ORIF) LEFT FIFTH METATARSAL BASE FRACTURE NONUNION; CALCANEAL AUTOGRAFT ;  Surgeon: Wylene Simmer, MD;  Location: [place];  Service: Orthopedics;  Laterality: Left;    Current Outpatient Rx  Name  Route  Sig  Dispense  Refill  . aspirin EC 325 MG tablet   Oral   Take 1 tablet (325 mg total) by mouth daily.   42 tablet   0   . cephALEXin (KEFLEX) 500 MG capsule   Oral   Take 1 capsule (500 mg total) by mouth 4 (four) times daily.   40 capsule   0    . docusate sodium (COLACE) 100 MG capsule      Take 1 tablet once or twice daily as needed for constipation while taking narcotic pain medicine   30 capsule   0   . empagliflozin (JARDIANCE) 25 MG TABS tablet   Oral   Take 25 mg by mouth daily.         Marland Kitchen HYDROcodone-acetaminophen (NORCO/VICODIN) 5-325 MG tablet   Oral   Take 1-2 tablets by mouth every 4 (four) hours as needed for moderate pain.   30 tablet   0   . insulin aspart (NOVOLOG) 100 UNIT/ML injection   Subcutaneous   Inject into the skin 3 (three) times daily before meals.         . Insulin Degludec (TRESIBA FLEXTOUCH) 100 UNIT/ML SOPN   Subcutaneous   Inject 35 Units into the skin at bedtime.         . multivitamin-iron-minerals-folic acid (CENTRUM) chewable tablet   Oral   Chew 1 tablet by mouth daily.         Marland Kitchen olmesartan-hydrochlorothiazide (BENICAR HCT) 40-25 MG per tablet   Oral   Take 1 tablet by mouth daily.         Marland Kitchen oxyCODONE (ROXICODONE) 5 MG immediate release tablet   Oral  Take 1-2 tablets (5-10 mg total) by mouth every 4 (four) hours as needed for moderate pain or severe pain.   30 tablet   0   . oxyCODONE-acetaminophen (PERCOCET) 5-325 MG tablet   Oral   Take 2 tablets by mouth every 6 (six) hours as needed for moderate pain or severe pain.   30 tablet   0   . senna (SENOKOT) 8.6 MG TABS tablet   Oral   Take 2 tablets (17.2 mg total) by mouth 2 (two) times daily.   30 each   0   . tamsulosin (FLOMAX) 0.4 MG CAPS capsule   Oral   Take 0.4 mg by mouth.           Allergies Review of patient's allergies indicates no known allergies.  History reviewed. No pertinent family history.  Social History Social History  Substance Use Topics  . Smoking status: Never Smoker   . Smokeless tobacco: None  . Alcohol Use: No    Review of Systems Constitutional: No fever/chills Cardiovascular: Denies chest pain. Respiratory: Denies shortness of breath. Gastrointestinal:  No  nausea, no vomiting. Musculoskeletal: Left foot pain positive. Skin: Left foot chronic open wound. Neurological: Negative for headaches, focal weakness or numbness.  10-point ROS otherwise negative.  ____________________________________________   PHYSICAL EXAM:  VITAL SIGNS: ED Triage Vitals  Enc Vitals Group     BP 10/02/15 0809 157/93 mmHg     Pulse Rate 10/02/15 0809 95     Resp 10/02/15 0809 18     Temp 10/02/15 0809 98.1 F (36.7 C)     Temp Source 10/02/15 0809 Oral     SpO2 10/02/15 0809 98 %     Weight 10/02/15 0809 255 lb (115.667 kg)     Height 10/02/15 0809 5\' 10"  (1.778 m)     Head Cir --      Peak Flow --      Pain Score 10/02/15 0809 10     Pain Loc --      Pain Edu? --      Excl. in [place]? --     Constitutional: Alert and oriented. Well appearing and in no acute distress. Eyes: Conjunctivae are normal. PERRL. EOMI. Head: Atraumatic. Nose: No congestion/rhinnorhea. Neck: No stridor.   Cardiovascular: Normal rate, regular rhythm. Grossly normal heart sounds.  Good peripheral circulation. Respiratory: Normal respiratory effort.  No retractions. Lungs CTAB. Musculoskeletal: Plantar aspect of the left foot with chronic superficial ulcerative lesion. See more information and her skin. Range of motion is within normal limits. Neurologic:  Normal speech and language. No gross focal neurologic deficits are appreciated. No gait instability. Skin:  Skin is warm, dry. Left foot lateral plantar aspect there is a open wound present that appears chronic. There is no active drainage or odor present today. There is no erythema extending from this area. Area is tender to palpation. No warmth is appreciated. Psychiatric: Mood and affect are normal. Speech and behavior are normal.  ____________________________________________   LABS (all labs ordered are listed, but only abnormal results are displayed)  Labs Reviewed  GLUCOSE, CAPILLARY - Abnormal; Notable for the following:     Glucose-Capillary 217 (*)    All other components within normal limits  CBG MONITORING, ED     PROCEDURES  Procedure(s) performed: None  Critical Care performed: No  ____________________________________________   INITIAL IMPRESSION / ASSESSMENT AND PLAN / ED COURSE  Pertinent labs & imaging results that were available during my care of the patient  were reviewed by me and considered in my medical decision making (see chart for details).   At this time patient has finished up a complete round of Keflex. There is no evidence of infection today and there is no active drainage. Patient is to keep his 0.0 with his podiatrist on Friday. We also discussed leaving his foot open and elevated while watching TV. He is continue to watch for signs of infection and follow up with his primary care doctor or podiatrist if needed sooner. ____________________________________________   FINAL CLINICAL IMPRESSION(S) / ED DIAGNOSES  Final diagnoses:  Open wound of left foot, subsequent encounter      Johnn Hai, PA-C 10/02/15 [place], MD 10/02/15 1450

## 2015-10-25 ENCOUNTER — Encounter: Payer: Managed Care, Other (non HMO) | Attending: Internal Medicine | Admitting: Internal Medicine

## 2015-10-25 DIAGNOSIS — L97521 Non-pressure chronic ulcer of other part of left foot limited to breakdown of skin: Secondary | ICD-10-CM | POA: Insufficient documentation

## 2015-10-25 DIAGNOSIS — Z794 Long term (current) use of insulin: Secondary | ICD-10-CM | POA: Diagnosis not present

## 2015-10-25 DIAGNOSIS — E1142 Type 2 diabetes mellitus with diabetic polyneuropathy: Secondary | ICD-10-CM | POA: Diagnosis not present

## 2015-10-25 DIAGNOSIS — I10 Essential (primary) hypertension: Secondary | ICD-10-CM | POA: Diagnosis not present

## 2015-10-25 DIAGNOSIS — E11621 Type 2 diabetes mellitus with foot ulcer: Secondary | ICD-10-CM | POA: Insufficient documentation

## 2015-10-25 DIAGNOSIS — Z8249 Family history of ischemic heart disease and other diseases of the circulatory system: Secondary | ICD-10-CM | POA: Diagnosis not present

## 2015-10-25 NOTE — Progress Notes (Signed)
SURAFEL, BULKLEY ([id number]) Visit Report for 10/25/2015 Abuse/Suicide Risk Screen Details Patient Name: [name], [name]. Date of Service: 10/25/2015 2:15 PM Medical Record Patient Account Number: [id number] Number: Treating RN: [name] May 03, 1961 [phone] y.o. Other Clinician: Date of Birth/Sex: Male) Treating [name] Primary Care Physician/Extender: [name] Physician: Referring Physician: Gemma [name] in Treatment: 0 Abuse/Suicide Risk Screen Items Answer ABUSE/SUICIDE RISK SCREEN: Has anyone close to you tried to hurt or harm you recentlyo No Do you feel uncomfortable with anyone in your familyo No Has anyone forced you do things that you didnot want to doo No Do you have any thoughts of harming yourselfo No Patient displays signs or symptoms of abuse and/or neglect. No Electronic Signature(s) Signed: 10/25/2015 4:20:11 PM By: [name] BSN, RN Entered By: [name] on 10/25/2015 14:26:06 [name] ([id number]) -------------------------------------------------------------------------------- Activities of Daily Living Details Patient Name: [name]. Date of Service: 10/25/2015 2:15 PM Medical Record Patient Account Number: [id number] Number: Treating RN: [name] December 28, 1960 [phone] y.o. Other Clinician: Date of Birth/Sex: Male) Treating [name] Primary Care Physician/Extender: [name] Physician: Referring Physician: Gemma [name] in Treatment: 0 Activities of Daily Living Items Answer Activities of Daily Living (Please select one for each item) Drive Automobile Completely Able Take Medications Completely Able Use Telephone Completely Able Care for Appearance Completely Able Use Toilet Completely Able Bath / Shower Completely Able Dress Self Completely Able Feed Self Completely Able Walk Completely Able Get In / Out Bed Completely Able Housework Completely Able Prepare  Meals Completely [place] for Self Completely Able Electronic Signature(s) Signed: 10/25/2015 4:20:11 PM By: [name] BSN, RN Entered By: [name] on 10/25/2015 14:26:26 [name] ([id number]) -------------------------------------------------------------------------------- Education Assessment Details Patient Name: [name]. Date of Service: 10/25/2015 2:15 PM Medical Record Patient Account Number: [id number] Number: Treating RN: [name] October 20, 1960 [phone] y.o. Other Clinician: Date of Birth/Sex: Male) Treating [name] Primary Care Physician/Extender: [name] Physician: Referring Physician: Gemma [name] in Treatment: 0 Primary Learner Assessed: Patient Learning Preferences/Education Level/Primary Language Learning Preference: Explanation Highest Education Level: High School Preferred Language: English Cognitive Barrier Assessment/Beliefs Language Barrier: No Physical Barrier Assessment Impaired Vision: Yes Glasses Impaired Hearing: No Decreased Hand dexterity: No Knowledge/Comprehension Assessment Knowledge Level: High Comprehension Level: High Ability to understand written High instructions: Ability to understand verbal High instructions: Motivation Assessment Anxiety Level: Calm Cooperation: Cooperative Education Importance: Acknowledges Need Interest in Health Problems: Asks Questions Perception: Coherent Willingness to Engage in Self- High Management Activities: Readiness to Engage in Self- High Management Activities: Electronic Signature(s) Signed: 10/25/2015 4:20:11 PM By: [name] BSN, RN [name], [name] ([id number]) Entered By: [name] on 10/25/2015 14:26:53 [name], [name] ([id number]) -------------------------------------------------------------------------------- Fall Risk Assessment Details Patient Name: [name]. Date of Service: 10/25/2015  2:15 PM Medical Record Patient Account Number: [id number] Number: Treating RN: [name] [birth year] [phone] y.o. Other Clinician: Date of Birth/Sex: Male) Treating [name] Primary Care Physician/Extender: [name] Physician: Referring Physician: Gemma [name] in Treatment: 0 Fall Risk Assessment Items Have you had 2 or more falls in the last 12 monthso 0 No Have you had any fall that resulted in injury in the last 12 monthso 0 No FALL RISK ASSESSMENT: History of falling - immediate or within 3 months 0 No Secondary diagnosis 0 No Ambulatory aid None/bed rest/wheelchair/nurse 0 Yes Crutches/cane/walker 0  No Furniture 0 No IV Access/Saline Lock 0 No Gait/Training Normal/bed rest/immobile 0 Yes Weak 0 No Impaired 0 No Mental Status Oriented to own ability 0 Yes Electronic Signature(s) Signed: 10/25/2015 4:20:11 PM By: [name] BSN, RN Entered By: [name] on 10/25/2015 14:27:09 [name] ([id number]) -------------------------------------------------------------------------------- Foot Assessment Details Patient Name: [name]. Date of Service: 10/25/2015 2:15 PM Medical Record Patient Account Number: [id number] Number: Treating RN: [name] [birth year] [phone] y.o. Other Clinician: Date of Birth/Sex: Male) Treating [name] Primary Care Physician/Extender: [name] Physician: Referring Physician: Gemma [name] in Treatment: 0 Foot Assessment Items Site Locations + = Sensation present, - = Sensation absent, C = Callus, U = Ulcer R = Redness, W = Warmth, M = Maceration, PU = Pre-ulcerative lesion F = Fissure, S = Swelling, D = Dryness Assessment Right: Left: Other Deformity: No No Prior Foot Ulcer: No No Prior Amputation: No No Charcot Joint: No No Ambulatory Status: Ambulatory Without Help Gait: Steady Electronic Signature(s) Signed: 10/25/2015 4:20:11 PM By: [name]  BSN, RN Entered By: [name] on 10/25/2015 14:27:26 [name] ([name] ([id number]) -------------------------------------------------------------------------------- Nutrition Risk Assessment Details Patient Name: [name]. Date of Service: 10/25/2015 2:15 PM Medical Record Patient Account Number: [id number] Number: Treating RN: [name] [birth year] [phone] y.o. Other Clinician: Date of Birth/Sex: Male) Treating [name] Primary Care Physician/Extender: [name] Physician: Referring Physician: Gemma [name] in Treatment: 0 Height (in): 70 Weight (lbs): 245 Body Mass Index (BMI): 35.2 Nutrition Risk Assessment Items NUTRITION RISK SCREEN: I have an illness or condition that made me change the kind and/or 0 No amount of food I eat I eat fewer than two meals per day 0 No I eat few fruits and vegetables, or milk products 0 No I have three or more drinks of beer, liquor or wine almost every day 0 No I have tooth or mouth problems that make it hard for me to eat 0 No I don't always have enough money to buy the food I need 0 No I eat alone most of the time 0 No I take three or more different prescribed or over-the-counter drugs a 0 No day Without wanting to, I have lost or gained 10 pounds in the last six 0 No months I am not always physically able to shop, cook and/or feed myself 0 No Nutrition Protocols Good Risk Protocol 0 No interventions needed Moderate Risk Protocol Electronic Signature(s) Signed: 10/25/2015 4:20:11 PM By: [name] BSN, RN Entered By: [name] on 10/25/2015 14:27:16

## 2015-10-26 NOTE — Progress Notes (Signed)
KOEHN, SALEHI ([id number]) Visit Report for 10/25/2015 Chief Complaint Document Details Patient Name: [name], [name]. Date of Service: 10/25/2015 2:15 PM Medical Record Patient Account Number: [id number] Number: Treating RN: Baruch Gouty, RN, BSN, [name] 1961-06-21 [phone] y.o. Other Clinician: Date of Birth/Sex: Male) Treating Laruth Hanger Primary Care Physician/Extender: Rose Fillers, [name] Physician: Referring Physician: Gemma [name] in Treatment: 0 Information Obtained from: Patient Chief Complaint Chronic left calf traumatic ulcer (healed). Left third toe ulcer (healed). New right calf ulcer. 10/25/15; patient returns today with a wound over his left lateral foot Electronic Signature(s) Signed: 10/26/2015 7:59:30 AM By: [name] MD Entered By: [name] on 10/25/2015 14:58:07 [name] ([id number]) -------------------------------------------------------------------------------- Debridement Details Patient Name: [name]. Date of Service: 10/25/2015 2:15 PM Medical Record Patient Account Number: [id number] Number: Treating RN: Baruch Gouty, RN, BSN, [name] 1960-11-28 [phone] y.o. Other Clinician: Date of Birth/Sex: Male) Treating Markeita Alicia Primary Care Physician/Extender: Rose Fillers, [name] Physician: Referring Physician: Gemma [name] in Treatment: 0 Debridement Performed for Wound #4 Left,Lateral,Plantar Foot Assessment: Performed By: Physician Ricard Dillon, MD Debridement: Debridement Pre-procedure Yes Verification/Time Out Taken: Start Time: 14:38 Pain Control: Lidocaine 4% Topical Solution Level: Skin/Subcutaneous Tissue Total Area Debrided (L x 1 (cm) x 1 (cm) = 1 (cm) W): Tissue and other Non-Viable, Fibrin/Slough, Subcutaneous material debrided: Instrument: Curette Bleeding: Minimum Hemostasis Achieved: Pressure End Time: 14:43 Procedural Pain: 0 Post Procedural Pain: 0 Response to Treatment: Procedure was  tolerated well Post Debridement Measurements of Total Wound Length: (cm) 1 Width: (cm) 1 Depth: (cm) 0.3 Volume: (cm) 0.236 Post Procedure Diagnosis Same as Pre-procedure Electronic Signature(s) Signed: 10/25/2015 4:20:11 PM By: Regan Lemming BSN, RN Signed: 10/26/2015 7:59:30 AM By: [name] MD Entered By: [name] on 10/25/2015 14:54:34 [name] ([id number] Brood, [name] ([id number]) -------------------------------------------------------------------------------- HPI Details Patient Name: [name]. Date of Service: 10/25/2015 2:15 PM Medical Record Patient Account Number: [id number] Number: Treating RN: Baruch Gouty, RN, BSN, [name] 22-Nov-1960 [phone] y.o. Other Clinician: Date of Birth/Sex: Male) Treating Valentino Saavedra Primary Care Physician/Extender: Rose Fillers, [name] Physician: Referring Physician: Gemma [name] in Treatment: 0 History of Present Illness HPI Description: Pleasant 55 year old with history of diabetes (Hgb A1c 10.8 in 2014) and peripheral neuropathy. No PVD. L ABI 1.1. Status post right great toe partial amputation years ago. He was at work and on 10/22/2014, was injured by a cart, and suffered an ulceration to his left anterior calf. He says that it subsequently became infected, and he was treated with a course of antibiotics. He was found on initial exam to have an ulceration on the dorsum of his left third toe. He was unaware of this and attributes it to pressure from his steel toed boots. More recently he injured his right anterior calf on a cart. Ambulating normally per his baseline. He has been undergoing regular debridements, applying mupirocin cream, and an Ace wrap for edema control. He returns to clinic for follow-up and is without complaints. No pain. No fever or chills. No drainage. 10/25/15; this is a 55 year old man who has type II diabetes with diabetic polyneuropathy. He tells me that he fractured his left fifth  metatarsal in June 2016 when he presented with swelling. He does not recall a specific injury. His hemoglobin A1c was apparently too high at the time for consideration of surgery and he was put in some form of offloading. Ultimately he went to surgery in December with an allograft from his calcaneus to this site,  plate and screws. He had an x-ray of the foot in March that showed concerns about nonunion. He tells me that in March he had to move and basically moved himself. He was on his foot a lot and then noticed some drainage from an open area. He has been following with his orthopedic surgeon Dr. Doran Durand. He has been applying a felt donut, dry dressing and using his heel healing sandal. Electronic Signature(s) Signed: 10/26/2015 7:59:30 AM By: [name] MD Entered By: [name] on 10/25/2015 15:21:32 [name] ([id number]) -------------------------------------------------------------------------------- Physical Exam Details Patient Name: [name]. Date of Service: 10/25/2015 2:15 PM Medical Record Patient Account Number: [id number] Number: Treating RN: Baruch Gouty, RN, BSN, [name] April 26, 1961 [phone] y.o. Other Clinician: Date of Birth/Sex: Male) Treating Alon Mazor Primary Care Physician/Extender: Rose Fillers, [name] Physician: Referring Physician: Gemma [name] in Treatment: 0 Constitutional Patient is hypertensive.. Pulse regular and within target range for patient.Marland Kitchen Respirations regular, non-labored and within target range.. Temperature is normal and within the target range for the patient.. Patient's appearance is neat and clean. Appears in no acute distress. Well nourished and well developed.. Eyes Conjunctivae have some redness. Mild discharge noted. No scleral icterus. Respiratory Respiratory effort is easy and symmetric bilaterally. Rate is normal at rest and on room air.. Bilateral breath sounds are clear and equal in all lobes with no wheezes,  rales or rhonchi.. Cardiovascular Heart rhythm and rate regular, without murmur or gallop.. Pedal pulses palpable and strong bilaterally.. Edema present in both extremities. This is mild. He has evidence of chronic venous insufficiency and I note that he has had wounds on his legs in the past. Musculoskeletal tenderness medial midfoot. no drainage.. Integumentary (Hair, Skin) wound over the base of the 5th met head on left foot. No drainage not infected. Neurological abscent lite touch to left foot. Psychiatric No evidence of depression, anxiety, or agitation. Calm, cooperative, and communicative. Appropriate interactions and affect.. Notes Wound exam. small wound with callous and fibrinous surface slough. debrided. surrounding callous Electronic Signature(s) Signed: 10/26/2015 7:59:30 AM By: [name] MD Entered By: [name] on 10/25/2015 15:30:45 [name] ([id number]) -------------------------------------------------------------------------------- Physician Orders Details Patient Name: KAISER, BELLUOMINI. Date of Service: 10/25/2015 2:15 PM Medical Record Patient Account Number: [id number] Number: Treating RN: Baruch Gouty, RN, BSN, [name] 11-26-1960 [phone] y.o. Other Clinician: Date of Birth/Sex: Male) Treating Destenee Guerry Primary Care Physician/Extender: Rose Fillers, [name] Physician: Referring Physician: Gemma [name] in Treatment: 0 Verbal / Phone Orders: Yes Clinician: Afful, RN, BSN, [name] Read Back and Verified: Yes Diagnosis Coding Wound Cleansing Wound #4 Left,Lateral,Plantar Foot o Cleanse wound with mild soap and water o May Shower, gently pat wound dry prior to applying new dressing. o May shower with protection. Anesthetic Wound #4 Left,Lateral,Plantar Foot o Topical Lidocaine 4% cream applied to wound bed prior to debridement Primary Wound Dressing Wound #4 Left,Lateral,Plantar Foot o Aquacel Ag Secondary Dressing Wound #4  Left,Lateral,Plantar Foot o Gauze and Kerlix/Conform Dressing Change Frequency Wound #4 Left,Lateral,Plantar Foot o Change dressing every day. Follow-up Appointments Wound #4 Left,Lateral,Plantar Foot o Return Appointment in 1 week. Off-Loading Wound #4 Left,Lateral,Plantar Foot o Other: - felt Additional Orders / Instructions Wound #4 Left,Lateral,Plantar Foot JANDIEL, MAGALLANES. ([id number]) o Increase protein intake. o Activity as tolerated Electronic Signature(s) Signed: 10/25/2015 4:20:11 PM By: Regan Lemming BSN, RN Signed: 10/26/2015 7:59:30 AM By: [name] MD Entered By: Regan Lemming on 10/25/2015 14:45:13 [name] ([id number]) -------------------------------------------------------------------------------- Problem List Details Patient Name: ERDEY,  Price F. Date of Service: 10/25/2015 2:15 PM Medical Record Patient Account Number: [id number] Number: Treating RN: Baruch Gouty, RN, BSN, [name] [birth year] [phone] y.o. Other Clinician: Date of Birth/Sex: Male) Treating Rachit Grim Primary Care Physician/Extender: Rose Fillers, [name] Physician: Referring Physician: Gemma [name] in Treatment: 0 Active Problems ICD-10 Encounter Code Description Active Date Diagnosis L97.521 Non-pressure chronic ulcer of other part of left foot limited 10/25/2015 Yes to breakdown of skin E11.621 Type 2 diabetes mellitus with foot ulcer 10/25/2015 Yes E11.42 Type 2 diabetes mellitus with diabetic polyneuropathy 10/25/2015 Yes Inactive Problems Resolved Problems Electronic Signature(s) Signed: 10/26/2015 7:59:30 AM By: [name] MD Entered By: [name] on 10/25/2015 14:54:23 [name] ([id number]) -------------------------------------------------------------------------------- Progress Note Details Patient Name: [name]. Date of Service: 10/25/2015 2:15 PM Medical Record Patient Account Number: [id number] Number: Treating RN:  Baruch Gouty, RN, BSN, [name] 1961/01/29 [phone] y.o. Other Clinician: Date of Birth/Sex: Male) Treating Ragen Laver Primary Care Physician/Extender: Rose Fillers, [name] Physician: Referring Physician: Gemma [name] in Treatment: 0 Subjective Chief Complaint Information obtained from Patient Chronic left calf traumatic ulcer (healed). Left third toe ulcer (healed). New right calf ulcer. 10/25/15; patient returns today with a wound over his left lateral foot Wound History Patient presents with 1 open wound that has been present for approximately 98month Patient has been treating wound in the following manner: dry dressing. Laboratory tests have not been performed in the last month. Patient reportedly has tested positive for an antibiotic resistant organism. Patient reportedly has not tested positive for osteomyelitis. Patient reportedly has not had testing performed to evaluate circulation in the legs. Patient History Information obtained from Patient. Allergies No Known Allergies Family History Diabetes - Mother, Father, Heart Disease - Father, Mother, Hypertension - Mother, Father, Stroke - Father, No family history of Cancer, Hereditary Spherocytosis, Kidney Disease, Lung Disease, Seizures, Thyroid Problems, Tuberculosis. Social History Never smoker, Marital Status - Married, Alcohol Use - Never, Drug Use - No History, Caffeine Use - Daily. Medical History Eyes Denies history of Cataracts, Glaucoma, Optic Neuritis Ear/Nose/Mouth/Throat Denies history of Chronic sinus problems/congestion, Middle ear problems Hematologic/Lymphatic Denies history of Anemia, Hemophilia, Human Immunodeficiency Virus, Lymphedema, Sickle Cell Disease Respiratory PRAMELO, OETKEN([id number] Denies history of Aspiration, Asthma, Chronic Obstructive Pulmonary Disease (COPD), Pneumothorax, Sleep Apnea, Tuberculosis Cardiovascular Patient has history of Hypertension Denies history of Angina, Arrhythmia,  Congestive Heart Failure, Coronary Artery Disease, Deep Vein Thrombosis, Hypotension, Myocardial Infarction, Peripheral Arterial Disease, Peripheral Venous Disease, Phlebitis, Vasculitis Gastrointestinal Denies history of Cirrhosis , Colitis, Crohn s, Hepatitis A, Hepatitis B, Hepatitis C Endocrine Patient has history of Type II Diabetes Denies history of Type I Diabetes Genitourinary Denies history of End Stage Renal Disease Immunological Denies history of Lupus Erythematosus, Raynaud s, Scleroderma Integumentary (Skin) Denies history of History of Burn, History of pressure wounds Musculoskeletal Denies history of Gout, Rheumatoid Arthritis, Osteoarthritis, Osteomyelitis Neurologic Patient has history of Neuropathy Denies history of Dementia, Quadriplegia, Paraplegia, Seizure Disorder Oncologic Denies history of Received Chemotherapy, Received Radiation Psychiatric Denies history of Anorexia/bulimia, Confinement Anxiety Patient is treated with Insulin. Blood sugar is tested. Review of Systems (ROS) Eyes The patient has no complaints or symptoms. Ear/Nose/Mouth/Throat The patient has no complaints or symptoms. Hematologic/Lymphatic The patient has no complaints or symptoms. Respiratory The patient has no complaints or symptoms. Cardiovascular The patient has no complaints or symptoms. Gastrointestinal The patient has no complaints or symptoms. Endocrine The patient has no complaints or symptoms. Genitourinary The patient has no complaints or symptoms. Immunological  The patient has no complaints or symptoms. Integumentary (Skin) Complains or has symptoms of Wounds. OSA, CAMPOLI ([id number]) Musculoskeletal The patient has no complaints or symptoms. Neurologic The patient has no complaints or symptoms. Oncologic The patient has no complaints or symptoms. Psychiatric The patient has no complaints or symptoms. Objective Constitutional Vitals Time Taken: 2:27 PM,  Height: 70 in, Source: Stated, Weight: 257 lbs, Source: Measured, BMI: 36.9, Temperature: 98.1 F, Pulse: 96 bpm, Respiratory Rate: 16 breaths/min, Blood Pressure: 169/88 mmHg. Integumentary (Hair, Skin) Wound #4 status is Open. Original cause of wound was Pressure Injury. The wound is located on the [place]. The wound measures 1cm length x 1cm width x 0.3cm depth; 0.785cm^2 area and 0.236cm^3 volume. The wound is limited to skin breakdown. There is no tunneling or undermining noted. There is a medium amount of serosanguineous drainage noted. The wound margin is distinct with the outline attached to the wound base. There is medium (34-66%) pink granulation within the wound bed. There is a small (1-33%) amount of necrotic tissue within the wound bed including Adherent Slough. The periwound skin appearance exhibited: Moist. The periwound skin appearance did not exhibit: Callus, Crepitus, Excoriation, Fluctuance, Friable, Induration, Localized Edema, Rash, Scarring, Dry/Scaly, Maceration, Atrophie Blanche, Cyanosis, Ecchymosis, Hemosiderin Staining, Mottled, Pallor, Rubor, Erythema. Periwound temperature was noted as No Abnormality. The periwound has tenderness on palpation. Assessment Active Problems ICD-10 L97.521 - Non-pressure chronic ulcer of other part of left foot limited to breakdown of skin E11.621 - Type 2 diabetes mellitus with foot ulcer E11.42 - Type 2 diabetes mellitus with diabetic polyneuropathy HERSHALL, BENKERT. ([id number]) Procedures Wound #4 Wound #4 is a Diabetic Wound/Ulcer of the Lower Extremity located on the Left,Lateral,Plantar Foot . There was a Skin/Subcutaneous Tissue Debridement ([id number]) debridement with total area of 1 sq cm performed by Ricard Dillon, MD. with the following instrument(s): Curette to remove Non-Viable tissue/material including Fibrin/Slough and Subcutaneous after achieving pain control using Lidocaine 4% Topical  Solution. A time out was conducted prior to the start of the procedure. A Minimum amount of bleeding was controlled with Pressure. The procedure was tolerated well with a pain level of 0 throughout and a pain level of 0 following the procedure. Post Debridement Measurements: 1cm length x 1cm width x 0.3cm depth; 0.236cm^3 volume. Post procedure Diagnosis Wound #4: Same as Pre-Procedure Plan Wound Cleansing: Wound #4 Left,Lateral,Plantar Foot: Cleanse wound with mild soap and water May Shower, gently pat wound dry prior to applying new dressing. May shower with protection. Anesthetic: Wound #4 Left,Lateral,Plantar Foot: Topical Lidocaine 4% cream applied to wound bed prior to debridement Primary Wound Dressing: Wound #4 Left,Lateral,Plantar Foot: Aquacel Ag Secondary Dressing: Wound #4 Left,Lateral,Plantar Foot: Gauze and Kerlix/Conform Dressing Change Frequency: Wound #4 Left,Lateral,Plantar Foot: Change dressing every day. Follow-up Appointments: Wound #4 Left,Lateral,Plantar Foot: Return Appointment in 1 week. Off-Loading: Wound #4 Left,Lateral,Plantar Foot: Other: - felt Additional Orders / Instructions: Wound #4 Left,Lateral,Plantar Foot: Increase protein intake. Activity as tolerated EDILSON, VITAL. ([id number]) #1 we applied Aquacel Ag to the wound with Kerlix and conform. He is advised to change this every second day the dressing dry. #2 we continued him in the healing sandal he has already. Advised him to limit his activity is much as possible although I'm expecting to have to put this man in a total contact cast to get closure. #3 he has a significant deformity in the left foot at the surgical site, significant neuropathy developing a Charcot foot. He is going to need  diabetic shoes with custom inserts especially at work. He has had prior wound issues in the right foot Electronic Signature(s) Signed: 10/26/2015 7:59:30 AM By: [name] MD Entered By: [name] on 10/25/2015 15:31:18 [name] ([id number]) -------------------------------------------------------------------------------- ROS/PFSH Details Patient Name: [name]. Date of Service: 10/25/2015 2:15 PM Medical Record Patient Account Number: [id number] Number: Treating RN: Baruch Gouty, RN, BSN, [name] Oct 31, 1960 [phone] y.o. Other Clinician: Date of Birth/Sex: Male) Treating Jubal Rademaker Primary Care Physician/Extender: Rose Fillers, [name] Physician: Referring Physician: Gemma [name] in Treatment: 0 Label Progress Note Print Version as History and Physical for this encounter Information Obtained From Patient Wound History Do you currently have one or more open woundso Yes How many open wounds do you currently haveo 1 Approximately how long have you had your woundso 43monthHow have you been treating your wound(s) until nowo dry dressing Has your wound(s) ever healed and then re-openedo No Have you had any lab work done in the past montho No Have you tested positive for osteomyelitis (bone infection)o No Have you had any tests for circulation on your legso No Integumentary (Skin) Complaints and Symptoms: Positive for: Wounds Medical History: Negative for: History of Burn; History of pressure wounds Eyes Complaints and Symptoms: No Complaints or Symptoms Medical History: Negative for: Cataracts; Glaucoma; Optic Neuritis Ear/Nose/Mouth/Throat Complaints and Symptoms: No Complaints or Symptoms Medical History: Negative for: Chronic sinus problems/congestion; Middle ear problems Hematologic/Lymphatic PGIANNIS, CORPUZ([id number] Complaints and Symptoms: No Complaints or Symptoms Medical History: Negative for: Anemia; Hemophilia; Human Immunodeficiency Virus; Lymphedema; Sickle Cell Disease Respiratory Complaints and Symptoms: No Complaints or Symptoms Medical History: Negative for: Aspiration; Asthma; Chronic Obstructive Pulmonary Disease  (COPD); Pneumothorax; Sleep Apnea; Tuberculosis Cardiovascular Complaints and Symptoms: No Complaints or Symptoms Medical History: Positive for: Hypertension Negative for: Angina; Arrhythmia; Congestive Heart Failure; Coronary Artery Disease; Deep Vein Thrombosis; Hypotension; Myocardial Infarction; Peripheral Arterial Disease; Peripheral Venous Disease; Phlebitis; Vasculitis Gastrointestinal Complaints and Symptoms: No Complaints or Symptoms Medical History: Negative for: Cirrhosis ; Colitis; Crohnos; Hepatitis A; Hepatitis B; Hepatitis C Endocrine Complaints and Symptoms: No Complaints or Symptoms Medical History: Positive for: Type II Diabetes Negative for: Type I Diabetes Time with diabetes: 20 yrs Treated with: Insulin Blood sugar tested every day: Yes Tested : 3 times weekly Genitourinary Complaints and Symptoms: No Complaints or Symptoms PRANDALE, CARVALHO ([id number] Medical History: Negative for: End Stage Renal Disease Immunological Complaints and Symptoms: No Complaints or Symptoms Medical History: Negative for: Lupus Erythematosus; Raynaudos; Scleroderma Musculoskeletal Complaints and Symptoms: No Complaints or Symptoms Medical History: Negative for: Gout; Rheumatoid Arthritis; Osteoarthritis; Osteomyelitis Neurologic Complaints and Symptoms: No Complaints or Symptoms Medical History: Positive for: Neuropathy Negative for: Dementia; Quadriplegia; Paraplegia; Seizure Disorder Oncologic Complaints and Symptoms: No Complaints or Symptoms Medical History: Negative for: Received Chemotherapy; Received Radiation Psychiatric Complaints and Symptoms: No Complaints or Symptoms Medical History: Negative for: Anorexia/bulimia; Confinement Anxiety Family and Social History Cancer: No; Diabetes: Yes - Mother, Father; Heart Disease: Yes - Father, Mother; Hereditary Spherocytosis: No; Hypertension: Yes - Mother, Father; Kidney Disease: No; Lung Disease: No;  Seizures: No; Stroke: Yes - Father; Thyroid Problems: No; Tuberculosis: No; Never smoker; Marital Status - Married; Alcohol Use: Never; Drug Use: No History; Caffeine Use: Daily; Financial Concerns: No; Food, Clothing or Shelter Needs: No; Support System Lacking: No; Transportation Concerns: No; Advanced Directives: No; Patient does not want information on Advanced Directives POMARR, HANN([id number] Electronic Signature(s) Signed: 10/25/2015 4:20:11 PM By: ARegan LemmingBSN, RN Signed: 10/26/2015  7:59:30 AM By: [name] MD Entered By: Regan Lemming on 10/25/2015 14:25:54 [name] ([id number]) -------------------------------------------------------------------------------- SuperBill Details Patient Name: [name]. Date of Service: 10/25/2015 Medical Record Patient Account Number: [id number] Number: Treating RN: Baruch Gouty, RN, BSN, [name] 22-Jan-1961 [phone] y.o. Other Clinician: Date of Birth/Sex: Male) Treating Jahkai Yandell, Jackson Primary Care Physician: Buddy Duty, [name] Physician/Extender: G Referring Physician: Gemma [name] in Treatment: 0 Diagnosis Coding ICD-10 Codes Code Description L97.521 Non-pressure chronic ulcer of other part of left foot limited to breakdown of skin E11.621 Type 2 diabetes mellitus with foot ulcer E11.42 Type 2 diabetes mellitus with diabetic polyneuropathy Facility Procedures CPT4: Description Modifier Quantity Code [id number] 99213 - WOUND CARE VISIT-LEV 3 EST PT 1 CPT4: 29798921 11042 - DEB SUBQ TISSUE 20 SQ CM/< 1 ICD-10 Description Diagnosis L97.521 Non-pressure chronic ulcer of other part of left foot limited to breakdown of skin Physician Procedures CPT4: Description Modifier Quantity Code [id number] WC PHYS LEVEL 3 o NEW PT 25 1 ICD-10 Description Diagnosis L97.521 Non-pressure chronic ulcer of other part of left foot limited to breakdown of skin CPT4: 8144818 56314 - WC PHYS SUBQ TISS 20 SQ CM 1 ICD-10 Description Diagnosis L97.521  Non-pressure chronic ulcer of other part of left foot limited to breakdown of skin Electronic Signature(s) AXIEL, FJELD ([id number]) Signed: 10/26/2015 7:59:30 AM By: [name] MD Entered By: [name] on 10/25/2015 15:32:27

## 2015-10-26 NOTE — Progress Notes (Signed)
RIES, YAMANAKA ([id number]) Visit Report for 10/25/2015 Allergy List Details Patient Name: SABRI, FETTEROLF. Date of Service: 10/25/2015 2:15 PM Medical Record Patient Account Number: [id number] Number: Treating RN: Baruch Gouty, RN, BSN, Rita 12/24/1960 [phone] y.o. Other Clinician: Date of Birth/Sex: Male) Treating ROBSON, MICHAEL Primary Care Physician: Buddy Duty, JEFFREY Physician/Extender: G Referring Physician: Gemma Payor in Treatment: 0 Allergies Active Allergies No Known Allergies Allergy Notes Electronic Signature(s) Signed: 10/25/2015 4:20:11 PM By: Regan Lemming BSN, RN Entered By: Regan Lemming on 10/25/2015 14:24:46 [name] ([id number]) -------------------------------------------------------------------------------- Arrival Information Details Patient Name: [name]. Date of Service: 10/25/2015 2:15 PM Medical Record Patient Account Number: [id number] Number: Treating RN: Baruch Gouty, RN, BSN, Rita [birth year] [phone] y.o. Other Clinician: Date of Birth/Sex: Male) Treating ROBSON, MICHAEL Primary Care Physician: Buddy Duty, JEFFREY Physician/Extender: G Referring Physician: Gemma Payor in Treatment: 0 Visit Information Patient Arrived: Ambulatory Arrival Time: 14:23 Accompanied By: self Transfer Assistance: None Patient Identification Verified: Yes Secondary Verification Process Yes Completed: Patient Requires Transmission-Based No Precautions: Patient Has Alerts: No History Since Last Visit Added or deleted any medications: No Any new allergies or adverse reactions: No Had a fall or experienced change in activities of daily living that may affect risk of falls: No Signs or symptoms of abuse/neglect since last No visito Hospitalized since last visit: No Has Dressing in Place as Prescribed: Yes Has Footwear/Offloading in Place as Prescribed: Yes Left: Wedge Shoe Electronic Signature(s) Signed: 10/25/2015 4:20:11 PM By: Regan Lemming BSN,  RN Entered By: Regan Lemming on 10/25/2015 14:24:30 [name] ([id number]) -------------------------------------------------------------------------------- Clinic Level of Care Assessment Details Patient Name: [name]. Date of Service: 10/25/2015 2:15 PM Medical Record Patient Account Number: [id number] Number: Treating RN: Baruch Gouty, RN, BSN, Rita Oct 04, 1960 [phone] y.o. Other Clinician: Date of Birth/Sex: Male) Treating ROBSON, MICHAEL Primary Care Physician: Buddy Duty, JEFFREY Physician/Extender: G Referring Physician: Gemma Payor in Treatment: 0 Clinic Level of Care Assessment Items TOOL 1 Quantity Score []  - Use when EandM and Procedure is performed on INITIAL visit 0 ASSESSMENTS - Nursing Assessment / Reassessment X - General Physical Exam (combine w/ comprehensive assessment (listed just 1 20 below) when performed on new pt. evals) X - Comprehensive Assessment (HX, ROS, Risk Assessments, Wounds Hx, etc.) 1 25 ASSESSMENTS - Wound and Skin Assessment / Reassessment []  - Dermatologic / Skin Assessment (not related to wound area) 0 ASSESSMENTS - Ostomy and/or Continence Assessment and Care []  - Incontinence Assessment and Management 0 []  - Ostomy Care Assessment and Management (repouching, etc.) 0 PROCESS - Coordination of Care X - Simple Patient / Family Education for ongoing care 1 15 []  - Complex (extensive) Patient / Family Education for ongoing care 0 X - Staff obtains Programmer, systems, Records, Test Results / Process Orders 1 10 []  - Staff telephones HHA, Nursing Homes / Clarify orders / etc 0 []  - Routine Transfer to another Facility (non-emergent condition) 0 []  - Routine Hospital Admission (non-emergent condition) 0 X - New Admissions / Biomedical engineer / Ordering NPWT, Apligraf, etc. 1 15 []  - Emergency Hospital Admission (emergent condition) 0 PROCESS - Special Needs []  - Pediatric / Minor Patient Management 0 EYLAN, VILLENA. ([id number]) []  -  Isolation Patient Management 0 []  - Hearing / Language / Visual special needs 0 []  - Assessment of Community assistance (transportation, D/C planning, etc.) 0 []  - Additional assistance / Altered mentation 0 []  - Support Surface(s) Assessment (bed, cushion, seat, etc.) 0 INTERVENTIONS - Miscellaneous []  - External ear  exam 0 []  - Patient Transfer (multiple staff / Civil Service fast streamer / Similar devices) 0 []  - Simple Staple / Suture removal (25 or less) 0 []  - Complex Staple / Suture removal (26 or more) 0 []  - Hypo/Hyperglycemic Management (do not check if billed separately) 0 X - Ankle / Brachial Index (ABI) - do not check if billed separately 1 15 Has the patient been seen at the hospital within the last three years: Yes Total Score: 100 Level Of Care: New/Established - Level 3 Electronic Signature(s) Signed: 10/25/2015 4:20:11 PM By: Regan Lemming BSN, RN Entered By: Regan Lemming on 10/25/2015 14:40:52 [name] ([id number]) -------------------------------------------------------------------------------- Encounter Discharge Information Details Patient Name: [name]. Date of Service: 10/25/2015 2:15 PM Medical Record Patient Account Number: [id number] Number: Treating RN: Baruch Gouty, RN, BSN, Rita 1960-11-14 [phone] y.o. Other Clinician: Date of Birth/Sex: Male) Treating ROBSON, MICHAEL Primary Care Physician: Buddy Duty, JEFFREY Physician/Extender: G Referring Physician: Gemma Payor in Treatment: 0 Encounter Discharge Information Items Discharge Pain Level: 0 Discharge Condition: Stable Ambulatory Status: Ambulatory Discharge Destination: Home Transportation: Private Auto Accompanied By: self Schedule Follow-up Appointment: No Medication Reconciliation completed and provided to Patient/Care No Ahmere [place]: Provided on Clinical Summary of Care: 10/25/2015 Form Type Recipient Paper Patient HP Electronic Signature(s) Signed: 10/25/2015 4:20:11 PM By: Regan Lemming BSN,  RN Previous Signature: 10/25/2015 2:50:25 PM Version By: Ruthine Dose Entered By: Regan Lemming on 10/25/2015 14:50:53 [name] ([id number]) -------------------------------------------------------------------------------- Lower Extremity Assessment Details Patient Name: [name]. Date of Service: 10/25/2015 2:15 PM Medical Record Patient Account Number: [id number] Number: Treating RN: Baruch Gouty, RN, BSN, Rita August 19, 1960 [phone] y.o. Other Clinician: Date of Birth/Sex: Male) Treating ROBSON, [place] Primary Care Physician: Buddy Duty, JEFFREY Physician/Extender: G Referring Physician: Gemma Payor in Treatment: 0 Edema Assessment Assessed: [Left: No] [Right: No] E[Left: dema] [Right: :] Calf Left: Right: Point of Measurement: 10 cm From Medial Instep cm cm Ankle Left: Right: Point of Measurement: 36 cm From Medial Instep cm cm Vascular Assessment Claudication: Claudication Assessment [Left:None] Pulses: Posterior Tibial Dorsalis Pedis Palpable: [Left:Yes] Doppler: [Left:Multiphasic] Extremity colors, hair growth, and conditions: Extremity Color: [Left:Dusky] Hair Growth on Extremity: [Left:Yes] Temperature of Extremity: [Left:Warm] Capillary Refill: [Left:< 3 seconds] Blood Pressure: Brachial: [Left:169] Dorsalis Pedis: 160 [Left:Dorsalis Pedis:] Ankle: Posterior Tibial: [Left:Posterior Tibial: 0.95] Toe Nail Assessment Left: Right: Thick: Yes Discolored: Yes KENTAVIOUS, GRABINSKI ([id number]) Deformed: No Improper Length and Hygiene: No Electronic Signature(s) Signed: 10/25/2015 4:20:11 PM By: Regan Lemming BSN, RN Entered By: Regan Lemming on 10/25/2015 14:31:56 [name] ([id number]) -------------------------------------------------------------------------------- Multi Wound Chart Details Patient Name: [name]. Date of Service: 10/25/2015 2:15 PM Medical Record Patient Account Number: [id number] Number: Treating RN: Baruch Gouty, RN,  BSN, Rita Nov 07, 1960 [phone] y.o. 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] Comorbid History: [4:Hypertension, Type II Diabetes, Neuropathy] [N/A:N/A] Date Acquired: [4:07/12/2015] [N/A:N/A] Weeks of Treatment: [4:0] [N/A:N/A] Wound Status: [4:Open] [N/A:N/A] Measurements L x W x D 1x1x0.3 [N/A:N/A] (cm) Area (cm) : [4:0.785] [N/A:N/A] Volume (cm) : [4:0.236] [N/A:N/A] Classification: [4:Grade 1] [N/A:N/A] Exudate Amount: [4:Medium] [N/A:N/A] Exudate Type: [4:Serosanguineous] [N/A:N/A] Exudate Color: [4:red, brown] [N/A:N/A]  Wound Margin: [4:Distinct, outline attached N/A] Granulation Amount: [4:Medium (34-66%)] [N/A:N/A] Granulation Quality: [4:Pink] [N/A:N/A] Necrotic Amount: [4:Small (1-33%)] [N/A:N/A] Exposed Structures: [4:Fascia: No Fat: No Tendon: No Muscle: No Joint: No Bone: No] [N/A:N/A] Limited to Skin Breakdown Epithelialization: None N/A N/A Periwound Skin Texture: Edema: No N/A N/A Excoriation: No Induration: No Callus: No Crepitus: No Fluctuance: No Friable: No Rash: No Scarring: No Periwound Skin Moist: Yes N/A N/A Moisture: Maceration: No Dry/Scaly: No Periwound Skin Color: Atrophie Blanche: No N/A N/A Cyanosis: No Ecchymosis: No Erythema: No Hemosiderin Staining: No Mottled: No Pallor: No Rubor: No Temperature: No Abnormality N/A N/A Tenderness on Yes N/A N/A Palpation: Wound Preparation: Ulcer Cleansing: N/A  N/A Rinsed/Irrigated with Saline Topical Anesthetic Applied: Other: lidocaine 4% Treatment Notes Electronic Signature(s) Signed: 10/25/2015 4:20:11 PM By: Regan Lemming BSN, RN Entered By: Regan Lemming on 10/25/2015 14:37:32 [name] ([id number]) -------------------------------------------------------------------------------- [place] Details Patient Name: KIMSEY, VELARDO. Date of Service: 10/25/2015 2:15 PM Medical Record Patient Account Number: [id number] Number: Treating RN: Baruch Gouty, RN, BSN, Rita 1961-02-11 [phone] y.o. Other Clinician: Date of Birth/Sex: Male) Treating ROBSON, [place] Primary Care Physician: Buddy Duty, JEFFREY Physician/Extender: G Referring Physician: Gemma Payor in Treatment: 0 Active Inactive Orientation to the Wound Care Program Nursing Diagnoses: Knowledge deficit related to the wound healing center program Goals: Patient/caregiver will verbalize understanding of the [place] Program Date Initiated: 10/25/2015 Goal Status: Active Interventions: Provide education on orientation to the wound center Notes: Peripheral Neuropathy Nursing Diagnoses: Knowledge deficit related to disease process and management of peripheral neurovascular dysfunction Potential alteration in peripheral tissue perfusion (select prior to confirmation of diagnosis) Goals: Patient/caregiver will verbalize understanding of disease process and disease management Date Initiated: 10/25/2015 Goal Status: Active Interventions: Assess signs and symptoms of neuropathy upon admission and as needed Provide education on Management of Neuropathy and Related Ulcers Provide education on Management of Neuropathy upon discharge from the [place] Treatment Activities: Test ordered outside of clinic : 10/25/2015 Notes: PSALMS, GARRINGER ([id number]) Wound/Skin Impairment Nursing Diagnoses: Impaired tissue integrity Knowledge deficit related to  smoking impact on wound healing Knowledge deficit related to ulceration/compromised skin integrity Goals: Patient/caregiver will verbalize understanding of skin care regimen Date Initiated: 10/25/2015 Goal Status: Active Ulcer/skin breakdown will have a volume reduction of 30% by week 4 Date Initiated: 10/25/2015 Goal Status: Active Ulcer/skin breakdown will have a volume reduction of 50% by week 8 Date Initiated: 10/25/2015 Goal Status: Active Ulcer/skin breakdown will have a volume reduction of 80% by week 12 Date Initiated: 10/25/2015 Goal Status: Active Ulcer/skin breakdown will heal within 14 weeks Date Initiated: 10/25/2015 Goal Status: Active Interventions: Assess patient/caregiver ability to obtain necessary supplies Assess patient/caregiver ability to perform ulcer/skin care regimen upon admission and as needed Provide education on ulcer and skin care Treatment Activities: Skin care regimen initiated : 10/25/2015 Topical wound management initiated : 10/25/2015 Notes: Electronic Signature(s) Signed: 10/25/2015 4:20:11 PM By: Regan Lemming BSN, RN Entered By: Regan Lemming on 10/25/2015 14:37:18 [name] ([id number]) -------------------------------------------------------------------------------- Pain Assessment Details Patient Name: [name]. Date of Service: 10/25/2015 2:15 PM Medical Record Patient Account Number: [id number] Number: Treating RN: Baruch Gouty, RN, BSN, Rita Nov 20, 1960 [phone] y.o. Other Clinician: Date of Birth/Sex: Male) Treating ROBSON, MICHAEL Primary Care Physician: Buddy Duty, JEFFREY Physician/Extender: G Referring Physician: Gemma Payor in Treatment: 0 Active Problems Location of Pain Severity and Description of Pain Patient Has Paino No Site Locations Rate the pain. Current Pain Level: 5 Character of Pain Describe  the Pain: Aching Pain Management and Medication Current Pain Management: How does your pain impact your activities  of daily livingo Sleep: Yes Bathing: Yes Appetite: Yes Relationship With Others: Yes Bladder Continence: Yes Emotions: Yes Bowel Continence: Yes Work: Yes Toileting: Yes Drive: Yes Dressing: Yes Hobbies: Yes Engineer, maintenance) Signed: 10/25/2015 4:20:11 PM By: Regan Lemming BSN, RN Entered By: Regan Lemming on 10/25/2015 14:27:49 [name] ([id number]) -------------------------------------------------------------------------------- Patient/Caregiver Education Details Patient Name: [name]. Date of Service: 10/25/2015 2:15 PM Medical Record Patient Account Number: [id number] Number: Treating RN: Baruch Gouty, RN, BSN, Rita [birth year] [phone] y.o. Other Clinician: Date of Birth/Gender: Male) Treating ROBSON, Elbert Primary Care Physician: Buddy Duty, JEFFREY Physician/Extender: G Referring Physician: Gemma Payor in Treatment: 0 Education Assessment Education Provided To: Patient Education Topics Provided Peripheral Neuropathy: Methods: Explain/Verbal Responses: State content correctly Welcome To [facility]: Methods: Explain/Verbal Responses: State content correctly Wound/Skin Impairment: Methods: Explain/Verbal Responses: State content correctly Electronic Signature(s) Signed: 10/25/2015 4:20:11 PM By: Regan Lemming BSN, RN Entered By: Regan Lemming on 10/25/2015 14:51:09 [name] ([id number]) -------------------------------------------------------------------------------- Wound Assessment Details Patient Name: [name]. Date of Service: 10/25/2015 2:15 PM Medical Record Patient Account Number: [id number] Number: Treating RN: Baruch Gouty, RN, BSN, Rita [birth year] [phone] y.o. Other Clinician: Date of Birth/Sex: Male) Treating ROBSON, MICHAEL Primary Care Physician: Buddy Duty, JEFFREY Physician/Extender: G Referring Physician: Gemma Payor in Treatment: 0 Wound Status Wound Number: 4 Primary Diabetic Wound/Ulcer of the  Lower Etiology: Extremity Wound Location: Left Foot - Plantar, Lateral Wound Status: Open Wounding Event: Pressure Injury Comorbid Hypertension, Type II Diabetes, Date Acquired: 07/12/2015 History: Neuropathy Weeks Of Treatment: 0 Clustered Wound: No Photos Photo Uploaded By: Regan Lemming on 10/25/2015 15:43:05 Wound Measurements Length: (cm) 1 Width: (cm) 1 Depth: (cm) 0.3 Area: (cm) 0.785 Volume: (cm) 0.236 % Reduction in Area: % Reduction in Volume: Epithelialization: None Tunneling: No Undermining: No Wound Description Classification: Grade 1 Wound Margin: Distinct, outline attached Exudate Amount: Medium Exudate Type: Serosanguineous Exudate Color: red, brown Foul Odor After Cleansing: No Wound Bed Granulation Amount: Medium (34-66%) Exposed Structure Granulation Quality: Pink Fascia Exposed: No Necrotic Amount: Small (1-33%) Fat Layer Exposed: No KEIGO, SCHIMPF ([id number]) Necrotic Quality: Adherent Slough Tendon Exposed: No Muscle Exposed: No Joint Exposed: No Bone Exposed: No Limited to Skin Breakdown Periwound Skin Texture Texture Color No Abnormalities Noted: No No Abnormalities Noted: No Callus: No Atrophie Blanche: No Crepitus: No Cyanosis: No Excoriation: No Ecchymosis: No Fluctuance: No Erythema: No Friable: No Hemosiderin Staining: No Induration: No Mottled: No Localized Edema: No Pallor: No Rash: No Rubor: No Scarring: No Temperature / Pain Moisture Temperature: No Abnormality No Abnormalities Noted: No Tenderness on Palpation: Yes Dry / Scaly: No Maceration: No Moist: Yes Wound Preparation Ulcer Cleansing: Rinsed/Irrigated with Saline Topical Anesthetic Applied: Other: lidocaine 4%, Treatment Notes Wound #4 (Left, Lateral, Plantar Foot) 1. Cleansed with: Clean wound with Normal Saline 4. Dressing Applied: Aquacel Ag 5. Secondary Dressing Applied Gauze and Kerlix/Conform 7. Secured with Architect) Signed: 10/25/2015 4:20:11 PM By: Regan Lemming BSN, RN Entered By: Regan Lemming on 10/25/2015 14:30:57 [name] ([id number]) -------------------------------------------------------------------------------- Vitals Details Patient Name: [name]. Date of Service: 10/25/2015 2:15 PM Medical Record Patient Account Number: [id number] Number: Treating RN: Baruch Gouty, RN, BSN, Rita 1961-02-09 [phone] y.o. Other Clinician: Date of Birth/Sex: Male) Treating ROBSON, MICHAEL Primary Care Physician: Buddy Duty, JEFFREY Physician/Extender: G Referring Physician: Gemma Payor in Treatment: 0 Vital Signs Time Taken: 14:27 Temperature (  F): 98.1 Height (in): 70 Pulse (bpm): 96 Source: Stated Respiratory Rate (breaths/min): 16 Weight (lbs): 257 Blood Pressure (mmHg): 169/88 Source: Measured Reference Range: 80 - 120 mg / dl Body Mass Index (BMI): 36.9 Electronic Signature(s) Signed: 10/25/2015 4:20:11 PM By: Regan Lemming BSN, RN Entered By: Regan Lemming on 10/25/2015 14:28:51

## 2015-11-01 ENCOUNTER — Encounter: Payer: Managed Care, Other (non HMO) | Attending: Internal Medicine | Admitting: Internal Medicine

## 2015-11-01 DIAGNOSIS — E1142 Type 2 diabetes mellitus with diabetic polyneuropathy: Secondary | ICD-10-CM | POA: Insufficient documentation

## 2015-11-01 DIAGNOSIS — L97521 Non-pressure chronic ulcer of other part of left foot limited to breakdown of skin: Secondary | ICD-10-CM | POA: Diagnosis not present

## 2015-11-01 DIAGNOSIS — E11621 Type 2 diabetes mellitus with foot ulcer: Secondary | ICD-10-CM | POA: Diagnosis not present

## 2015-11-01 DIAGNOSIS — Z794 Long term (current) use of insulin: Secondary | ICD-10-CM | POA: Insufficient documentation

## 2015-11-01 DIAGNOSIS — Z8249 Family history of ischemic heart disease and other diseases of the circulatory system: Secondary | ICD-10-CM | POA: Diagnosis not present

## 2015-11-01 DIAGNOSIS — I10 Essential (primary) hypertension: Secondary | ICD-10-CM | POA: Diagnosis not present

## 2015-11-01 DIAGNOSIS — L03116 Cellulitis of left lower limb: Secondary | ICD-10-CM | POA: Diagnosis not present

## 2015-11-02 ENCOUNTER — Other Ambulatory Visit
Admission: RE | Admit: 2015-11-02 | Discharge: 2015-11-02 | Disposition: A | Discharge status: Home / Self Care | Payer: Managed Care, Other (non HMO) | Source: Ambulatory Visit | Attending: Internal Medicine | Admitting: Internal Medicine

## 2015-11-02 DIAGNOSIS — S91302A Unspecified open wound, left foot, initial encounter: Secondary | ICD-10-CM | POA: Insufficient documentation

## 2015-11-02 NOTE — Progress Notes (Signed)
JADAN, CUBBAGE ([id number]) Visit Report for 11/01/2015 Chief Complaint Document Details Patient Name: [name], [name]. Date of Service: 11/01/2015 2:15 PM Medical Record Patient Account Number: [id number] Number: Treating RN: Baruch Gouty, RN, BSN, Rita [birth year] [phone] y.o. Other Clinician: Date of Birth/Sex: Male) Treating Jayleigh Notarianni Primary Care Physician/Extender: Rose Fillers, JEFFREY Physician: Referring Physician: Buddy Duty, JEFFREY Weeks in Treatment: 1 Information Obtained from: Patient Chief Complaint Chronic left calf traumatic ulcer (healed). Left third toe ulcer (healed). New right calf ulcer. 10/25/15; patient returns today with a wound over his left lateral foot Electronic Signature(s) Signed: 11/02/2015 1:24:33 PM By: [name] MD Entered By: [name] on 11/01/2015 15:09:19 [name] ([id number]) -------------------------------------------------------------------------------- Debridement Details Patient Name: [name]. Date of Service: 11/01/2015 2:15 PM Medical Record Patient Account Number: [id number] Number: Treating RN: Baruch Gouty, RN, BSN, Rita 1960/11/23 [phone] y.o. Other Clinician: Date of Birth/Sex: Male) Treating Jihan Rudy Primary Care Physician/Extender: Rose Fillers, JEFFREY Physician: Referring Physician: Buddy Duty, JEFFREY Weeks in Treatment: 1 Debridement Performed for Wound #4 Left,Lateral,Plantar Foot Assessment: Performed By: Physician Ricard Dillon, MD Debridement: Debridement Pre-procedure Yes Verification/Time Out Taken: Start Time: 14:55 Pain Control: Lidocaine 4% Topical Solution Level: Skin/Subcutaneous Tissue Total Area Debrided (L x 1.4 (cm) x 1.4 (cm) = 1.96 (cm) W): Tissue and other Non-Viable, Exudate, Fibrin/Slough, Skin, Subcutaneous material debrided: Instrument: Blade, Forceps Bleeding: Large Hemostasis Achieved: Pressure End Time: 15:00 Procedural Pain: 4 Post Procedural Pain: 4 Response to  Treatment: Procedure was tolerated well Post Debridement Measurements of Total Wound Length: (cm) 5 Width: (cm) 5 Depth: (cm) 0.2 Volume: (cm) 3.927 Post Procedure Diagnosis Same as Pre-procedure Electronic Signature(s) Signed: 11/01/2015 5:32:09 PM By: Regan Lemming BSN, RN Signed: 11/02/2015 1:24:33 PM By: [name] MD Entered By: [name] on 11/01/2015 15:09:00 [name] ([id number] Brood, [name] ([id number]) -------------------------------------------------------------------------------- HPI Details Patient Name: [name], [name]. Date of Service: 11/01/2015 2:15 PM Medical Record Patient Account Number: [id number] Number: Treating RN: Baruch Gouty, RN, BSN, Rita 19-Jun-1961 [phone] y.o. Other Clinician: Date of Birth/Sex: Male) Treating Eloisa Chokshi Primary Care Physician/Extender: Rose Fillers, JEFFREY Physician: Referring Physician: Buddy Duty, JEFFREY Weeks in Treatment: 1 History of Present Illness HPI Description: Pleasant 55 year old with history of diabetes (Hgb A1c 10.8 in 2014) and peripheral neuropathy. No PVD. L ABI 1.1. Status post right great toe partial amputation years ago. He was at work and on 10/22/2014, was injured by a cart, and suffered an ulceration to his left anterior calf. He says that it subsequently became infected, and he was treated with a course of antibiotics. He was found on initial exam to have an ulceration on the dorsum of his left third toe. He was unaware of this and attributes it to pressure from his steel toed boots. More recently he injured his right anterior calf on a cart. Ambulating normally per his baseline. He has been undergoing regular debridements, applying mupirocin cream, and an Ace wrap for edema control. He returns to clinic for follow-up and is without complaints. No pain. No fever or chills. No drainage. 10/25/15; this is a 55 year old man who has type II diabetes with diabetic polyneuropathy. He tells me that  he fractured his left fifth metatarsal in June 2016 when he presented with swelling. He does not recall a specific injury. His hemoglobin A1c was apparently too high at the time for consideration of surgery and he was put in some form of offloading. Ultimately he went to surgery in December with an allograft from his calcaneus  to this site, plate and screws. He had an x-ray of the foot in March that showed concerns about nonunion. He tells me that in March he had to move and basically moved himself. He was on his foot a lot and then noticed some drainage from an open area. He has been following with his orthopedic surgeon Dr. Doran Durand. He has been applying a felt donut, dry dressing and using his heel healing sandal. 11/01/15; this is a patient I saw last week for the first time. He had a small open wound on the plantar aspect of his left foot at roughly the level of the base of his fifth metatarsal. He had a considerable degree of thickened skin around this wound on the plantar aspect which I thought was from chronic pressure on this area. He tells [place] that he had drainage over the course of the week. No systemic symptoms. Electronic Signature(s) Signed: 11/02/2015 1:24:33 PM By: [name] MD Entered By: [name] on 11/01/2015 15:11:12 [name] ([id number]) -------------------------------------------------------------------------------- Physical Exam Details Patient Name: [name], [name]. Date of Service: 11/01/2015 2:15 PM Medical Record Patient Account Number: [id number] Number: Treating RN: Baruch Gouty, RN, BSN, Rita 1961/06/23 [phone] y.o. Other Clinician: Date of Birth/Sex: Male) Treating Stacie Knutzen Primary Care Physician/Extender: Rose Fillers, JEFFREY Physician: Referring Physician: Buddy Duty, JEFFREY Weeks in Treatment: 1 Constitutional Patient is hypertensive.. Pulse regular and within target range for patient.Marland Kitchen Respirations regular, non-labored and within target  range.. Temperature is normal and within the target range for the patient.. Patient's appearance is neat and clean. Appears in no acute distress. Well nourished and well developed.. Notes Wound exam; the small wound on the plantar aspect of his foot looked about the same however on the lateral aspect of his foot there was clearly serosanguineous purulent looking fluid pressure on this area caused leakage of this fluid from the wound site. This is been obtained for culture. Nevertheless the skin surrounding the new area clearly was nonviable and needed to be debridement. He therefore has a new open area on the lateral aspect of his foot. The thickened surrounding callus on the plantar aspect of his foot for now looks viable Electronic Signature(s) Signed: 11/02/2015 1:24:33 PM By: [name] MD Entered By: [name] on 11/01/2015 15:12:59 [name] ([id number]) -------------------------------------------------------------------------------- Physician Orders Details Patient Name: [name]. Date of Service: 11/01/2015 2:15 PM Medical Record Patient Account Number: [id number] Number: Treating RN: Baruch Gouty, RN, BSN, Rita 08-13-1960 [phone] y.o. Other Clinician: Date of Birth/Sex: Male) Treating Macarthur Lorusso Primary Care Physician/Extender: Rose Fillers, JEFFREY Physician: Referring Physician: Mackey Birchwood in Treatment: 1 Verbal / Phone Orders: Yes Clinician: Afful, RN, BSN, Rita Read Back and Verified: Yes Diagnosis Coding Wound Cleansing Wound #4 Left,Lateral,Plantar Foot o Cleanse wound with mild soap and water o May Shower, gently pat wound dry prior to applying new dressing. o May shower with protection. Anesthetic Wound #4 Left,Lateral,Plantar Foot o Topical Lidocaine 4% cream applied to wound bed prior to debridement Primary Wound Dressing Wound #4 Left,Lateral,Plantar Foot o Aquacel Ag Secondary Dressing Wound #4 Left,Lateral,Plantar  Foot o Gauze and Kerlix/Conform Dressing Change Frequency Wound #4 Left,Lateral,Plantar Foot o Change dressing every day. Follow-up Appointments Wound #4 Left,Lateral,Plantar Foot o Return Appointment in 1 week. Off-Loading Wound #4 Left,Lateral,Plantar Foot o Other: - felt Additional Orders / Instructions Wound #4 Left,Lateral,Plantar Foot [name], [name]. ([id number]) o Increase protein intake. o Activity as tolerated Medications-please add to medication list. Wound #4 Left,Lateral,Plantar Foot o P.O.  Antibiotics - Doxy 100mg  po bid x 7 days Augmentin 875mg  po bid x 7 days Laboratory o Bacteria identified in Wound by Culture (MICRO) - left lateral foot oooo LOINC Code: [id number] oooo Convenience Name: Wound culture routine Patient Medications Allergies: No Known Allergies Notifications Medication Indication Start End doxycycline hyclate wound infection 11/02/2015 DOSE oral 100 mg capsule - capsule oral Augmentin Wound 11/01/2015 infection DOSE oral 875 mg-125 mg tablet - tablet oral Electronic Signature(s) Signed: 11/01/2015 5:06:20 PM By: Regan Lemming BSN, RN Signed: 11/02/2015 1:24:33 PM By: [name] MD Entered By: Regan Lemming on 11/01/2015 17:06:20 [name], [name] ([id number]) -------------------------------------------------------------------------------- Prescription 11/01/2015 Patient Name: [name]. Physician: Ricard Dillon MD Date of Birth: [birth year] NPI#: [id number] Sex: [name] DEA#: [id number] Phone #: [phone] License #: [id number] Patient Address: [address] [place], [place] [address] [address], [place], [place] [address] [phone] Allergies No Known Allergies Medication Medication: Route: Strength: Form: doxycycline hyclate oral 100 mg capsule Class: PERIODONTAL COLLAGENASE INHIBITORS Dose: Frequency / Time:  Indication: capsule oral wound infection Number of Refills: Number of Units: 0 Generic Substitution: Start Date: End Date: Administered at Substitution Permitted [id number] Facility: No Note to Pharmacy: Signature(s): Date(s): DEVERY, OBERHELMAN ([id number]) Prescription 11/01/2015 Patient Name: WLLIAM, PASINI. Physician: Ricard Dillon MD Date of Birth: 06-03-1961 NPI#: [id number] Sex: [name] DEA#: [id number] Phone #: [phone] License #: [id number] Patient Address: [address] [place], [place] [address] [address] [place],  [address] [phone] Allergies No Known Allergies Medication Medication: Route: Strength: Form: Augmentin oral 875 mg-125 mg tablet Class: PENICILLINS Dose: Frequency / Time: Indication: tablet oral Wound infection Number of Refills: Number of Units: 0 Generic Substitution: Start Date: End Date: Administered at Substitution Permitted [id number] Facility: No Note to Pharmacy: Signature(s): Date(s): LATREVION, SABOL ([id number]) Electronic Signature(s) Signed: 11/01/2015 5:32:09 PM By: Regan Lemming BSN, RN Signed: 11/02/2015 1:24:33 PM By: [name] MD Entered By: Regan Lemming on 11/01/2015 17:06:20 [name] ([id number]) --------------------------------------------------------------------------------  Problem List Details Patient Name: DAIVEN, WINTERROWD. Date of Service: 11/01/2015 2:15 PM Medical Record Patient Account Number: [id number] Number: Treating RN: Baruch Gouty, RN, BSN, Rita 1960-07-13 [phone] y.o. Other Clinician: Date of Birth/Sex: Male) Treating Danesha Kirchoff Primary Care Physician/Extender: Rose Fillers, JEFFREY Physician: Referring Physician: Buddy Duty, JEFFREY Weeks in Treatment: 1 Active Problems ICD-10 Encounter Code Description Active Date Diagnosis L97.521 Non-pressure chronic ulcer of other part of left  foot limited 10/25/2015 Yes to breakdown of skin E11.621 Type 2 diabetes mellitus with foot ulcer 10/25/2015 Yes E11.42 Type 2 diabetes mellitus with diabetic polyneuropathy 10/25/2015 Yes L03.116 Cellulitis of left lower limb 11/01/2015 Yes Inactive Problems Resolved Problems Electronic Signature(s) Signed: 11/02/2015 1:24:33 PM By: [name] MD Entered By: [name] on 11/01/2015 15:16:48 [name] ([id number]) -------------------------------------------------------------------------------- Progress Note Details Patient Name: [name]. Date of Service: 11/01/2015 2:15 PM Medical Record Patient Account Number: [id number] Number: Treating RN: Baruch Gouty, RN, BSN, Rita [birth year] [phone] y.o. Other Clinician: Date of Birth/Sex: Male) Treating Dorlisa Savino Primary Care Physician/Extender: Rose Fillers, JEFFREY Physician: Referring Physician: Buddy Duty, JEFFREY Weeks in Treatment: 1 Subjective Chief Complaint Information obtained from Patient Chronic left calf traumatic ulcer (healed). Left third toe ulcer (healed). New right calf ulcer. 10/25/15; patient returns today with a wound over his left lateral foot History of Present Illness (HPI) Pleasant 55 year old  with history of diabetes (Hgb A1c 10.8 in 2014) and peripheral neuropathy. No PVD. L ABI 1.1. Status post right great toe partial amputation years ago. He was at work and on 10/22/2014, was injured by a cart, and suffered an ulceration to his left anterior calf. He says that it subsequently became infected, and he was treated with a course of antibiotics. He was found on initial exam to have an ulceration on the dorsum of his left third toe. He was unaware of this and attributes it to pressure from his steel toed boots. More recently he injured his right anterior calf on a cart. Ambulating normally per his baseline. He has been undergoing regular debridements, applying mupirocin cream, and an Ace wrap for  edema control. He returns to clinic for follow-up and is without complaints. No pain. No fever or chills. No drainage. 10/25/15; this is a 55 year old man who has type II diabetes with diabetic polyneuropathy. He tells me that he fractured his left fifth metatarsal in June 2016 when he presented with swelling. He does not recall a specific injury. His hemoglobin A1c was apparently too high at the time for consideration of surgery and he was put in some form of offloading. Ultimately he went to surgery in December with an allograft from his calcaneus to this site, plate and screws. He had an x-ray of the foot in March that showed concerns about nonunion. He tells me that in March he had to move and basically moved himself. He was on his foot a lot and then noticed some drainage from an open area. He has been following with his orthopedic surgeon Dr. Doran Durand. He has been applying a felt donut, dry dressing and using his heel healing sandal. 11/01/15; this is a patient I saw last week for the first time. He had a small open wound on the plantar aspect of his left foot at roughly the level of the base of his fifth metatarsal. He had a considerable degree of thickened skin around this wound on the plantar aspect which I thought was from chronic pressure on this area. He tells [place] that he had drainage over the course of the week. No systemic symptoms. [name], [name] ([id number]) Objective Constitutional Patient is hypertensive.. Pulse regular and within target range for patient.Marland Kitchen Respirations regular, non-labored and within target range.. Temperature is normal and within the target range for the patient.. Patient's appearance is neat and clean. Appears in no acute distress. Well nourished and well developed.. Vitals Time Taken: 2:39 PM, Height: 70 in, Weight: 257 lbs, BMI: 36.9, Temperature: 98.1 F, Pulse: 98 bpm, Respiratory Rate: 17 breaths/min, Blood Pressure: 156/77 mmHg. General Notes: Wound  exam; the small wound on the plantar aspect of his foot looked about the same however on the lateral aspect of his foot there was clearly serosanguineous purulent looking fluid pressure on this area caused leakage of this fluid from the wound site. This is been obtained for culture. Nevertheless the skin surrounding the new area clearly was nonviable and needed to be debridement. He therefore has a new open area on the lateral aspect of his foot. The thickened surrounding callus on the plantar aspect of his foot for now looks viable Integumentary (Hair, Skin) Wound #4 status is Open. Original cause of wound was Pressure Injury. The wound is located on the [place]. The wound measures 1.4cm length x 1.4cm width x 0.3cm depth; 1.539cm^2 area and 0.462cm^3 volume. The wound is limited to skin breakdown. There is no  tunneling or undermining noted. There is a medium amount of serosanguineous drainage noted. The wound margin is distinct with the outline attached to the wound base. There is medium (34-66%) pink granulation within the wound bed. There is a medium (34-66%) amount of necrotic tissue within the wound bed including Adherent Slough. The periwound skin appearance exhibited: Callus, Moist. The periwound skin appearance did not exhibit: Crepitus, Excoriation, Fluctuance, Friable, Induration, Localized Edema, Rash, Scarring, Dry/Scaly, Maceration, Atrophie Blanche, Cyanosis, Ecchymosis, Hemosiderin Staining, Mottled, Pallor, Rubor, Erythema. Periwound temperature was noted as No Abnormality. The periwound has tenderness on palpation. Assessment Active Problems ICD-10 L97.521 - Non-pressure chronic ulcer of other part of left foot limited to breakdown of skin E11.621 - Type 2 diabetes mellitus with foot ulcer E11.42 - Type 2 diabetes mellitus with diabetic polyneuropathy [name], [name]. ([id number]) Procedures Wound #4 Wound #4 is a Diabetic Wound/Ulcer of the Lower  Extremity located on the Left,Lateral,Plantar Foot . There was a Skin/Subcutaneous Tissue Debridement [id number]) debridement with total area of 1.96 sq cm performed by Ricard Dillon, MD. with the following instrument(s): Blade and Forceps to remove Non-Viable tissue/material including Exudate, Fibrin/Slough, Skin, and Subcutaneous after achieving pain control using Lidocaine 4% Topical Solution. A time out was conducted prior to the start of the procedure. A Large amount of bleeding was controlled with Pressure. The procedure was tolerated well with a pain level of 4 throughout and a pain level of 4 following the procedure. Post Debridement Measurements: 5cm length x 5cm width x 0.2cm depth; 3.927cm^3 volume. Post procedure Diagnosis Wound #4: Same as Pre-Procedure Plan Wound Cleansing: Wound #4 Left,Lateral,Plantar Foot: Cleanse wound with mild soap and water May Shower, gently pat wound dry prior to applying new dressing. May shower with protection. Anesthetic: Wound #4 Left,Lateral,Plantar Foot: Topical Lidocaine 4% cream applied to wound bed prior to debridement Primary Wound Dressing: Wound #4 Left,Lateral,Plantar Foot: Aquacel Ag Secondary Dressing: Wound #4 Left,Lateral,Plantar Foot: Gauze and Kerlix/Conform Dressing Change Frequency: Wound #4 Left,Lateral,Plantar Foot: Change dressing every day. Follow-up Appointments: Wound #4 Left,Lateral,Plantar Foot: Return Appointment in 1 week. Off-Loading: Wound #4 Left,Lateral,Plantar Foot: Other: - felt Additional Orders / Instructions: Wound #4 Left,Lateral,Plantar Foot: Increase protein intake. Activity as tolerated Laboratory ordered were: Wound culture routine - left lateral foot [name], [name]. ([id number]) #1 Aquacel Ag to the actual open area #2 the patient has a significant cellulitis surrounding the wound. I don't think this is enough to justify sending him to the emergency room at this point. There is no  open bone or hardware #3 empiric Augmentin and doxycycline #4 March the area of tenderness. He get expanding tenderness and/or systemic symptoms I've advised him to seek prompt medical attention even before our appointment next week with him. Electronic Signature(s) Signed: 11/02/2015 1:24:33 PM By: [name] MD Entered By: [name] on 11/01/2015 15:14:41 [name] ([id number]) -------------------------------------------------------------------------------- SuperBill Details Patient Name: [name], [name]. Date of Service: 11/01/2015 Medical Record Patient Account Number: [id number] Number: Treating RN: Baruch Gouty, RN, BSN, Rita 06-04-1961 [phone] y.o. Other Clinician: Date of Birth/Sex: Male) Treating Sanari Offner Primary Care Physician: Buddy Duty, JEFFREY Physician/Extender: G Referring Physician: Buddy Duty, JEFFREY Weeks in Treatment: 1 Diagnosis Coding ICD-10 Codes Code Description L97.521 Non-pressure chronic ulcer of other part of left foot limited to breakdown of skin E11.621 Type 2 diabetes mellitus with foot ulcer E11.42 Type 2 diabetes mellitus with diabetic polyneuropathy Facility Procedures CPT4: Description Modifier Quantity Code [id number] - DEB SUBQ TISSUE 20 SQ CM/< 1 ICD-10 Description  Diagnosis L97.521 Non-pressure chronic ulcer of other part of left foot limited to breakdown of skin Physician Procedures CPT4: Description Modifier Quantity Code [id number] - WC PHYS SUBQ TISS 20 SQ CM 1 ICD-10 Description Diagnosis L97.521 Non-pressure chronic ulcer of other part of left foot limited to breakdown of skin Electronic Signature(s) Signed: 11/02/2015 1:24:33 PM By: [name] MD Entered By: [name] on 11/01/2015 15:15:06

## 2015-11-02 NOTE — Progress Notes (Signed)
[name] ([id number]) Visit Report for 11/01/2015 Arrival Information Details Patient Name: [name], [name]. Date of Service: 11/01/2015 2:15 PM Medical Record Patient Account Number: [id number] Number: Treating RN: Baruch Gouty, RN, BSN, Rita 1961/01/02 [phone] y.o. Other Clinician: Date of Birth/Sex: Male) Treating ROBSON, MICHAEL Primary Care Physician: [name] Physician/Extender: G Referring Physician: Buddy Duty, [name] Weeks in Treatment: 1 Visit Information History Since Last Visit Added or deleted any medications: No Patient Arrived: Ambulatory Any new allergies or adverse reactions: No Arrival Time: 14:36 Had a fall or experienced change in No Accompanied By: self activities of daily living that may affect Transfer Assistance: None risk of falls: Patient Identification Verified: Yes Signs or symptoms of abuse/neglect since last No Secondary Verification Process Yes visito Completed: Hospitalized since last visit: No Patient Requires Transmission-Based No Has Dressing in Place as Prescribed: Yes Precautions: Pain Present Now: No Patient Has Alerts: No Electronic Signature(s) Signed: 11/01/2015 5:32:09 PM By: Regan Lemming BSN, RN Entered By: Regan Lemming on 11/01/2015 14:38:50 [name] ([id number]) -------------------------------------------------------------------------------- Encounter Discharge Information Details Patient Name: [name]. Date of Service: 11/01/2015 2:15 PM Medical Record Patient Account Number: [id number] Number: Treating RN: Baruch Gouty, RN, BSN, Rita [birth year] [phone] y.o. Other Clinician: Date of Birth/Sex: Male) Treating ROBSON, MICHAEL Primary Care Physician: [name] Physician/Extender: G Referring Physician: Buddy Duty, [name] Weeks in Treatment: 1 Encounter Discharge Information Items Discharge Pain Level: 0 Discharge Condition: Stable Ambulatory Status: Ambulatory Discharge Destination: Home Transportation:  Private Auto Accompanied By: self Schedule Follow-up Appointment: No Medication Reconciliation completed and provided to Patient/Care No [name]: Provided on Clinical Summary of Care: 11/01/2015 Form Type Recipient Paper Patient HP Electronic Signature(s) Signed: 11/01/2015 4:55:33 PM By: Regan Lemming BSN, RN Previous Signature: 11/01/2015 3:13:26 PM Version By: Ruthine Dose Entered By: Regan Lemming on 11/01/2015 16:55:33 [name] ([id number]) -------------------------------------------------------------------------------- Lower Extremity Assessment Details Patient Name: [name]. Date of Service: 11/01/2015 2:15 PM Medical Record Patient Account Number: [id number] Number: Treating RN: Baruch Gouty, RN, BSN, Rita Mar 09, 1961 [phone] y.o. Other Clinician: Date of Birth/Sex: Male) Treating ROBSON, [place] Primary Care Physician: [name] Physician/Extender: G Referring Physician: Buddy Duty, [name] Weeks in Treatment: 1 Edema Assessment Assessed: [Left: No] [Right: No] E[Left: dema] [Right: :] Calf Left: Right: Point of Measurement: 36 cm From Medial Instep cm cm Ankle Left: Right: Point of Measurement: 10 cm From Medial Instep cm cm Vascular Assessment Claudication: Claudication Assessment [Left:None] Pulses: Posterior Tibial Dorsalis Pedis Palpable: [Left:Yes] Extremity colors, hair growth, and conditions: Extremity Color: [Left:Mottled] Hair Growth on Extremity: [Left:Yes] Temperature of Extremity: [Left:Warm] Capillary Refill: [Left:< 3 seconds] Electronic Signature(s) Signed: 11/01/2015 5:32:09 PM By: Regan Lemming BSN, RN Entered By: Regan Lemming on 11/01/2015 14:40:11 [name] ([id number]) -------------------------------------------------------------------------------- Multi Wound Chart Details Patient Name: [name]. Date of Service: 11/01/2015 2:15 PM Medical Record Patient Account Number: [id number] Number: Treating RN: Baruch Gouty,  RN, BSN, Rita Aug 15, 1960 [phone] y.o. 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] Comorbid History: [4:Hypertension, Type II Diabetes, Neuropathy] [N/A:N/A] Date Acquired: [4:07/12/2015] [N/A:N/A] Weeks of Treatment: [4:1] [N/A:N/A] Wound Status: [4:Open] [N/A:N/A] Measurements L x W x D  1.4x1.4x0.3 [N/A:N/A] (cm) Area (cm) : [4:1.539] [N/A:N/A] Volume (cm) : [4:0.462] [N/A:N/A] % Reduction in Area: [4:-96.10%] [N/A:N/A] % Reduction in Volume: -95.80% [N/A:N/A] Classification: [4:Grade 1] [N/A:N/A] Exudate Amount: [4:Medium] [N/A:N/A] Exudate Type: [4:Serosanguineous] [N/A:N/A] Exudate Color: [4:red, brown] [N/A:N/A] Wound Margin: [4:Distinct, outline attached N/A] Granulation Amount: [4:Medium (34-66%)] [N/A:N/A] Granulation Quality: [4:Pink] [N/A:N/A] Necrotic Amount: [4:Medium (34-66%)] [N/A:N/A] Exposed Structures: [4:Fascia: No Fat: No Tendon: No Muscle: No] [N/A:N/A] Joint: No Bone: No Limited to Skin Breakdown Epithelialization: None N/A N/A Periwound Skin Texture: Callus: Yes N/A N/A Edema: No Excoriation: No Induration: No Crepitus: No Fluctuance: No Friable: No Rash: No Scarring: No Periwound Skin Moist: Yes N/A N/A Moisture: Maceration: No Dry/Scaly: No Periwound Skin Color: Atrophie Blanche: No N/A N/A Cyanosis: No Ecchymosis: No Erythema: No Hemosiderin Staining: No Mottled: No Pallor: No Rubor: No Temperature: No Abnormality N/A  N/A Tenderness on Yes N/A N/A Palpation: Wound Preparation: Ulcer Cleansing: N/A N/A Rinsed/Irrigated with Saline Topical Anesthetic Applied: Other: lidocaine 4% Treatment Notes Electronic Signature(s) Signed: 11/01/2015 5:32:09 PM By: Regan Lemming BSN, RN Entered By: Regan Lemming on 11/01/2015 14:58:39 [name] ([id number]) -------------------------------------------------------------------------------- [place] Details Patient Name: [name], [name]. Date of Service: 11/01/2015 2:15 PM Medical Record Patient Account Number: [id number] Number: Treating RN: Baruch Gouty, RN, BSN, Rita Mar 07, 1961 [phone] y.o. Other Clinician: Date of Birth/Sex: Male) Treating ROBSON, [place] Primary Care Physician: [name] Physician/Extender: G Referring Physician: Buddy Duty, [name] Weeks in Treatment: 1 Active Inactive Orientation to the Wound Care Program Nursing Diagnoses: Knowledge deficit related to the wound healing center program Goals: Patient/caregiver will verbalize understanding of the [place] Program Date Initiated: 10/25/2015 Goal Status: Active Interventions: Provide education on orientation to the wound center Notes: Peripheral Neuropathy Nursing Diagnoses: Knowledge deficit related to disease process and management of peripheral neurovascular dysfunction Potential alteration in peripheral tissue perfusion (select prior to confirmation of diagnosis) Goals: Patient/caregiver will verbalize understanding of disease process and disease management Date Initiated: 10/25/2015 Goal Status: Active Interventions: Assess signs and symptoms of neuropathy upon admission and as needed Provide education on Management of Neuropathy and Related Ulcers Provide education on Management of Neuropathy upon discharge from the [place] Treatment Activities: Test ordered outside of clinic : 10/25/2015 Notes: [name], [name] ([id number]) Wound/Skin  Impairment Nursing Diagnoses: Impaired tissue integrity Knowledge deficit related to smoking impact on wound healing Knowledge deficit related to ulceration/compromised skin integrity Goals: Patient/caregiver will verbalize understanding of skin care regimen Date Initiated: 10/25/2015 Goal Status: Active Ulcer/skin breakdown will have a volume reduction of 30% by week 4 Date Initiated: 10/25/2015 Goal Status: Active Ulcer/skin breakdown will have a volume reduction of 50% by week 8 Date Initiated: 10/25/2015 Goal Status: Active Ulcer/skin breakdown will have a volume reduction of 80% by week 12 Date Initiated: 10/25/2015 Goal Status: Active Ulcer/skin breakdown will heal within 14 weeks Date Initiated: 10/25/2015 Goal Status: Active Interventions: Assess patient/caregiver ability to obtain necessary supplies Assess patient/caregiver ability to perform ulcer/skin care regimen upon admission and as needed Provide education on ulcer and skin care Treatment Activities: Skin care regimen initiated : 10/25/2015 Topical wound management initiated : 10/25/2015 Notes: Electronic Signature(s) Signed: 11/01/2015 5:32:09 PM By: Regan Lemming BSN, RN Entered By: Regan Lemming on 11/01/2015 14:57:41 [name] ([id number]) -------------------------------------------------------------------------------- Pain Assessment Details Patient Name: [name]. Date of Service: 11/01/2015 2:15 PM Medical Record Patient Account Number: [id number] Number: Treating RN: Baruch Gouty, RN, BSN, Rita [birth year] [phone] y.o. Other Clinician: Date of Birth/Sex: Male) Treating ROBSON, MICHAEL  Primary Care Physician: [name] Physician/Extender: G Referring Physician: Delrae Rend Weeks in Treatment: 1 Active Problems Location of Pain Severity and Description of Pain Patient Has Paino No Site Locations Pain Management and Medication Current Pain Management: Electronic Signature(s) Signed: 11/01/2015  5:32:09 PM By: Regan Lemming BSN, RN Entered By: Regan Lemming on 11/01/2015 14:38:56 [name] ([id number]) -------------------------------------------------------------------------------- Patient/Caregiver Education Details Patient Name: [name]. Date of Service: 11/01/2015 2:15 PM Medical Record Patient Account Number: [id number] Number: Treating RN: Baruch Gouty, RN, BSN, Rita [birth year] [phone] y.o. Other Clinician: Date of Birth/Gender: Male) Treating ROBSON, [place] Primary Care Physician: [name] Physician/Extender: G Referring Physician: Mackey [place]ch[place] in Treatment: 1 Education Assessment Education Provided To: Patient Education Topics Provided Peripheral Neuropathy: Methods: Explain/Verbal Responses: State content correctly Welcome To The [place]: Methods: Explain/Verbal Responses: State content correctly Wound/Skin Impairment: Methods: Explain/Verbal Responses: State content correctly Electronic Signature(s) Signed: 11/01/2015 4:55:52 PM By: Regan Lemming BSN, RN Entered By: Regan Lemming on 11/01/2015 16:55:52 [name] ([id number]) -------------------------------------------------------------------------------- Wound Assessment Details Patient Name: [name]. Date of Service: 11/01/2015 2:15 PM Medical Record Patient Account Number: [id number] Number: Treating RN: Baruch Gouty, RN, BSN, Rita [birth year] [phone] y.o. Other Clinician: Date of Birth/Sex: Male) Treating ROBSON, MICHAEL Primary Care Physician: [name] Physician/Extender: G Referring Physician: Buddy Duty, [name] Weeks in Treatment: 1 Wound Status Wound Number: 4 Primary Diabetic Wound/Ulcer of the Lower Etiology: Extremity Wound Location: Left Foot - Plantar, Lateral Wound Status: Open Wounding Event: Pressure Injury Comorbid Hypertension, Type II Diabetes, Date Acquired: 07/12/2015 History: Neuropathy Weeks Of Treatment: 1 Clustered Wound:  No Photos Photo Uploaded By: Regan Lemming on 11/01/2015 16:44:31 Wound Measurements Length: (cm) 1.4 Width: (cm) 1.4 Depth: (cm) 0.3 Area: (cm) 1.539 Volume: (cm) 0.462 % Reduction in Area: -96.1% % Reduction in Volume: -95.8% Epithelialization: None Tunneling: No Undermining: No Wound Description Classification: Grade 1 Wound Margin: Distinct, outline attached Exudate Amount: Medium Exudate Type: Serosanguineous Exudate Color: red, brown Foul Odor After Cleansing: No Wound Bed Granulation Amount: Medium (34-66%) Exposed Structure Granulation Quality: Pink Fascia Exposed: No Necrotic Amount: Medium (34-66%) Fat Layer Exposed: No [name], [name] ([id number]) Necrotic Quality: Adherent Slough Tendon Exposed: No Muscle Exposed: No Joint Exposed: No Bone Exposed: No Limited to Skin Breakdown Periwound Skin Texture Texture Color No Abnormalities Noted: No No Abnormalities Noted: No Callus: Yes Atrophie Blanche: No Crepitus: No Cyanosis: No Excoriation: No Ecchymosis: No Fluctuance: No Erythema: No Friable: No Hemosiderin Staining: No Induration: No Mottled: No Localized Edema: No Pallor: No Rash: No Rubor: No Scarring: No Temperature / Pain Moisture Temperature: No Abnormality No Abnormalities Noted: No Tenderness on Palpation: Yes Dry / Scaly: No Maceration: No Moist: Yes Wound Preparation Ulcer Cleansing: Rinsed/Irrigated with Saline Topical Anesthetic Applied: Other: lidocaine 4%, Treatment Notes Wound #4 (Left, Lateral, Plantar Foot) 1. Cleansed with: Clean wound with Normal Saline 4. Dressing Applied: Aquacel Ag 5. Secondary Dressing Applied Bordered Foam Dressing Electronic Signature(s) Signed: 11/01/2015 5:32:09 PM By: Regan Lemming BSN, RN Entered By: Regan Lemming on 11/01/2015 14:44:40 [name] ([id number]) -------------------------------------------------------------------------------- Vitals Details Patient Name: [name]. Date of Service: 11/01/2015 2:15 PM Medical Record Patient Account Number: [id number] Number: Treating RN: Baruch Gouty, RN, BSN, Rita 1960/09/15 [phone] y.o. Other Clinician: Date of Birth/Sex: Male) Treating ROBSON, [place] Primary Care Physician: [name] Physician/Extender: G Referring Physician: Buddy Duty, [name] Weeks in Treatment: 1 Vital Signs Time Taken: 14:39 Temperature (F): 98.1 Height (in): 70 Pulse (bpm): 98 Weight (lbs): 257 Respiratory  Rate (breaths/min): 17 Body Mass Index (BMI): 36.9 Blood Pressure (mmHg): 156/77 Reference Range: 80 - 120 mg / dl Electronic Signature(s) Signed: 11/01/2015 5:32:09 PM By: Regan Lemming BSN, RN Entered By: Regan Lemming on 11/01/2015 14:39:47

## 2015-11-05 LAB — WOUND CULTURE: GRAM STAIN: NONE SEEN

## 2015-11-08 ENCOUNTER — Encounter: Payer: Managed Care, Other (non HMO) | Admitting: Internal Medicine

## 2015-11-08 DIAGNOSIS — E11621 Type 2 diabetes mellitus with foot ulcer: Secondary | ICD-10-CM | POA: Diagnosis not present

## 2015-11-09 NOTE — Progress Notes (Signed)
[name], [name] ([id number]) Visit Report for 11/08/2015 Arrival Information Details Patient Name: [name], [name]. Date of Service: 11/08/2015 1:30 PM Medical Record Patient Account Number: [id number] Number: Treating RN: Baruch Gouty, RN, BSN, Rita September 29, 1960 [phone] y.o. Other Clinician: Date of Birth/Sex: Male) Treating ROBSON, MICHAEL Primary Care Physician: Buddy Duty, JEFFREY Physician/Extender: G Referring Physician: Buddy Duty, JEFFREY Weeks in Treatment: 2 Visit Information History Since Last Visit Added or deleted any medications: No Patient Arrived: Ambulatory Any new allergies or adverse reactions: No Arrival Time: 13:49 Had a fall or experienced change in No Accompanied By: self activities of daily living that may affect Transfer Assistance: None risk of falls: Patient Identification Verified: Yes Signs or symptoms of abuse/neglect since last No Secondary Verification Process Yes visito Completed: Hospitalized since last visit: No Patient Requires Transmission-Based No Has Dressing in Place as Prescribed: Yes Precautions: Pain Present Now: No Patient Has Alerts: No Electronic Signature(s) Signed: 11/08/2015 5:24:50 PM By: Regan Lemming BSN, RN Entered By: Regan Lemming on 11/08/2015 13:50:00 [name] ([id number]) -------------------------------------------------------------------------------- Encounter Discharge Information Details Patient Name: [name]. Date of Service: 11/08/2015 1:30 PM Medical Record Patient Account Number: [id number] Number: Treating RN: Baruch Gouty, RN, BSN, Rita October 28, 1960 [phone] y.o. Other Clinician: Date of Birth/Sex: Male) Treating ROBSON, MICHAEL Primary Care Physician: Buddy Duty, JEFFREY Physician/Extender: G Referring Physician: Buddy Duty JEFFREY Weeks in Treatment: 2 Encounter Discharge Information Items Discharge Pain Level: 0 Discharge Condition: Stable Ambulatory Status: Ambulatory Discharge Destination: Home Transportation:  Private Auto Accompanied By: self Schedule Follow-up Appointment: No Medication Reconciliation completed and provided to Patient/Care No [name]: Provided on Clinical Summary of Care: 11/08/2015 Form Type Recipient Paper Patient HP Electronic Signature(s) Signed: 11/08/2015 2:34:29 PM By: Regan Lemming BSN, RN Previous Signature: 11/08/2015 2:22:09 PM Version By: Ruthine Dose Entered By: Regan Lemming on 11/08/2015 14:34:29 [name] ([id number]) -------------------------------------------------------------------------------- Lower Extremity Assessment Details Patient Name: [name]. Date of Service: 11/08/2015 1:30 PM Medical Record Patient Account Number: [id number] Number: Treating RN: Baruch Gouty, RN, BSN, Rita [birth year] [phone] y.o. Other Clinician: Date of Birth/Sex: Male) Treating ROBSON, [place] Primary Care Physician: Buddy Duty, JEFFREY Physician/Extender: G Referring Physician: Buddy Duty, JEFFREY Weeks in Treatment: 2 Edema Assessment Assessed: [Left: No] [Right: No] E[Left: dema] [Right: :] Calf Left: Right: Point of Measurement: 36 cm From Medial Instep cm cm Ankle Left: Right: Point of Measurement: 10 cm From Medial Instep cm cm Vascular Assessment Pulses: Posterior Tibial Dorsalis Pedis Palpable: [Left:Yes] Extremity colors, hair growth, and conditions: Extremity Color: [Left:Dusky] Hair Growth on Extremity: [Left:No] Temperature of Extremity: [Left:Warm] Capillary Refill: [Left:< 3 seconds] Electronic Signature(s) Signed: 11/08/2015 5:24:50 PM By: Regan Lemming BSN, RN Entered By: Regan Lemming on 11/08/2015 13:50:54 [name] ([id number]) -------------------------------------------------------------------------------- Multi Wound Chart Details Patient Name: [name]. Date of Service: 11/08/2015 1:30 PM Medical Record Patient Account Number: [id number] Number: Treating RN: Baruch Gouty, RN, BSN, Rita 1960-12-27 [phone] y.o. 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] Comorbid History: [4:Hypertension, Type II Diabetes, Neuropathy] [N/A:N/A] Date Acquired: [4:07/12/2015] [N/A:N/A] Weeks of Treatment: [4:2] [N/A:N/A] Wound Status: [4:Open] [N/A:N/A] Measurements L x W x D 4x3.5x1 [N/A:N/A] (cm) Area (  cm) : [4:10.996] [N/A:N/A] Volume (cm) : [4:10.996] [N/A:N/A] % Reduction in Area: [4:-1300.80%] [N/A:N/A] % Reduction in Volume: -4559.30% [N/A:N/A] Classification: [4:Grade 1] [N/A:N/A] Exudate Amount: [4:Medium] [N/A:N/A] Exudate Type: [4:Purulent] [N/A:N/A] Exudate Color: [4:yellow, brown, green] [N/A:N/A] Foul Odor After [4:Yes] [N/A:N/A] Cleansing: Odor Anticipated Due to No [N/A:N/A] Product Use: Wound Margin: [4:Distinct, outline attached N/A] Granulation Amount: [4:Medium (34-66%)] [N/A:N/A] Granulation Quality: [4:Pink] [N/A:N/A] Necrotic Amount: [4:Medium (34-66%)] [N/A:N/A] Exposed Structures: Fascia: No N/A N/A Fat: No Tendon: No Muscle: No Joint: No Bone: No Limited to Skin Breakdown Epithelialization: None N/A N/A Periwound Skin Texture: Callus: Yes N/A N/A Edema: No Excoriation: No Induration: No Crepitus: No Fluctuance: No Friable: No Rash: No Scarring: No Periwound Skin Moist: Yes N/A N/A Moisture: Maceration: No Dry/Scaly: No Periwound Skin Color: Atrophie Blanche: No N/A N/A Cyanosis: No Ecchymosis: No Erythema: No Hemosiderin Staining: No Mottled:  No Pallor: No Rubor: No Temperature: No Abnormality N/A N/A Tenderness on Yes N/A N/A Palpation: Wound Preparation: Ulcer Cleansing: N/A N/A Rinsed/Irrigated with Saline Topical Anesthetic Applied: Other: lidocaine 4% Treatment Notes Electronic Signature(s) Signed: 11/08/2015 5:24:50 PM By: Regan Lemming BSN, RN Entered By: Regan Lemming on 11/08/2015 14:06:28 [name] ([id number]) -------------------------------------------------------------------------------- [place] Details Patient Name: [name], [name]. Date of Service: 11/08/2015 1:30 PM Medical Record Patient Account Number: [id number] Number: Treating RN: Baruch Gouty, RN, BSN, Rita December 22, 1960 [phone] y.o. Other Clinician: Date of Birth/Sex: Male) Treating ROBSON, [place] Primary Care Physician: Buddy Duty, JEFFREY Physician/Extender: G Referring Physician: Buddy Duty, JEFFREY Weeks in Treatment: 2 Active Inactive Orientation to the Wound Care Program Nursing Diagnoses: Knowledge deficit related to the wound healing center program Goals: Patient/caregiver will verbalize understanding of the [facility] Date Initiated: 10/25/2015 Goal Status: Active Interventions: Provide education on orientation to the wound center Notes: Peripheral Neuropathy Nursing Diagnoses: Knowledge deficit related to disease process and management of peripheral neurovascular dysfunction Potential alteration in peripheral tissue perfusion (select prior to confirmation of diagnosis) Goals: Patient/caregiver will verbalize understanding of disease process and disease management Date Initiated: 10/25/2015 Goal Status: Active Interventions: Assess signs and symptoms of neuropathy upon admission and as needed Provide education on Management of Neuropathy and Related Ulcers Provide education on Management of Neuropathy upon discharge from the [place] [facility] Activities: Test ordered outside of clinic :  10/25/2015 Notes: [name], [name] ([id number]) Wound/Skin Impairment Nursing Diagnoses: Impaired tissue integrity Knowledge deficit related to smoking impact on wound healing Knowledge deficit related to ulceration/compromised skin integrity Goals: Patient/caregiver will verbalize understanding of skin care regimen Date Initiated: 10/25/2015 Goal Status: Active Ulcer/skin breakdown will have a volume reduction of 30% by week 4 Date Initiated: 10/25/2015 Goal Status: Active Ulcer/skin breakdown will have a volume reduction of 50% by week 8 Date Initiated: 10/25/2015 Goal Status: Active Ulcer/skin breakdown will have a volume reduction of 80% by week 12 Date Initiated: 10/25/2015 Goal Status: Active Ulcer/skin breakdown will heal within 14 weeks Date Initiated: 10/25/2015 Goal Status: Active Interventions: Assess patient/caregiver ability to obtain necessary supplies Assess patient/caregiver ability to perform ulcer/skin care regimen upon admission and as needed Provide education on ulcer and skin care Treatment Activities: Skin care regimen initiated : 10/25/2015 Topical wound management initiated : 10/25/2015 Notes: Electronic Signature(s) Signed: 11/08/2015 5:24:50 PM By: Regan Lemming BSN, RN Entered By: Regan Lemming on 11/08/2015 13:58:32 [name] ([id number]) -------------------------------------------------------------------------------- Pain Assessment Details Patient Name: [name]. Date of Service: 11/08/2015 1:30 PM Medical Record Patient Account Number: [id number] Number: Treating RN: Afful, RN, BSN, Velva Harman  08-14-1960 (55 y.o. Other Clinician: Date of Birth/Sex: Male) Treating ROBSON, MICHAEL Primary Care Physician: Buddy Duty, JEFFREY Physician/Extender: G Referring Physician: Buddy Duty, JEFFREY Weeks in Treatment: 2 Active Problems Location of Pain Severity and Description of Pain Patient Has Paino No Site Locations Pain Management and  Medication Current Pain Management: Electronic Signature(s) Signed: 11/08/2015 5:24:50 PM By: Regan Lemming BSN, RN Entered By: Regan Lemming on 11/08/2015 13:50:11 [name] ([id number]) -------------------------------------------------------------------------------- Patient/Caregiver Education Details Patient Name: [name]. Date of Service: 11/08/2015 1:30 PM Medical Record Patient Account Number: [id number] Number: Treating RN: Baruch Gouty, RN, BSN, Rita June 25, 1961 [phone] y.o. Other Clinician: Date of Birth/Gender: Male) Treating ROBSON, [place] Primary Care Physician: Buddy Duty, JEFFREY Physician/Extender: G Referring Physician: Mackey [place] in Treatment: 2 Education Assessment Education Provided To: Patient Education Topics Provided Peripheral Neuropathy: Methods: Explain/Verbal Responses: State content correctly Welcome To [facility]: Methods: Explain/Verbal Responses: State content correctly Wound/Skin Impairment: Methods: Explain/Verbal Responses: State content correctly Electronic Signature(s) Signed: 11/08/2015 2:34:47 PM By: Regan Lemming BSN, RN Entered By: Regan Lemming on 11/08/2015 14:34:46 [name] ([id number]) -------------------------------------------------------------------------------- Wound Assessment Details Patient Name: [name]. Date of Service: 11/08/2015 1:30 PM Medical Record Patient Account Number: [id number] Number: Treating RN: Baruch Gouty, RN, BSN, Rita Jun 08, 1961 [phone] y.o. Other Clinician: Date of Birth/Sex: Male) Treating ROBSON, MICHAEL Primary Care Physician: Buddy Duty, JEFFREY Physician/Extender: G Referring Physician: Buddy Duty, JEFFREY Weeks in Treatment: 2 Wound Status Wound Number: 4 Primary Diabetic Wound/Ulcer of the Lower Etiology: Extremity Wound Location: Left Foot - Plantar, Lateral Wound Status: Open Wounding Event: Pressure Injury Comorbid Hypertension, Type II Diabetes, Date Acquired:  07/12/2015 History: Neuropathy Weeks Of Treatment: 2 Clustered Wound: No Photos Photo Uploaded By: Regan Lemming on 11/08/2015 17:19:35 Wound Measurements Length: (cm) 4 Width: (cm) 3.5 Depth: (cm) 1 Area: (cm) 10.996 Volume: (cm) 10.996 % Reduction in Area: -1300.8% % Reduction in Volume: -4559.3% Epithelialization: None Tunneling: No Undermining: No Wound Description Classification: Grade 1 Wound Margin: Distinct, outline attached Exudate Amount: Large Exudate Type: Serosanguineous Exudate Color: red, brown Foul Odor After Cleansing: Yes Due to Product Use: No Wound Bed Granulation Amount: Medium (34-66%) Exposed Structure Granulation Quality: Pink Fascia Exposed: No Necrotic Amount: Medium (34-66%) Fat Layer Exposed: No [name], [name] ([id number]) Necrotic Quality: Adherent Slough Tendon Exposed: No Muscle Exposed: No Joint Exposed: No Bone Exposed: No Limited to Skin Breakdown Periwound Skin Texture Texture Color No Abnormalities Noted: No No Abnormalities Noted: No Callus: Yes Atrophie Blanche: No Crepitus: No Cyanosis: No Excoriation: No Ecchymosis: No Fluctuance: No Erythema: No Friable: No Hemosiderin Staining: No Induration: No Mottled: No Localized Edema: No Pallor: No Rash: No Rubor: No Scarring: No Temperature / Pain Moisture Temperature: No Abnormality No Abnormalities Noted: No Tenderness on Palpation: Yes Dry / Scaly: No Maceration: No Moist: Yes Wound Preparation Ulcer Cleansing: Rinsed/Irrigated with Saline Topical Anesthetic Applied: Other: lidocaine 4%, Treatment Notes Wound #4 (Left, Lateral, Plantar Foot) 1. Cleansed with: Cleanse wound with antibacterial soap and water 4. Dressing Applied: Aquacel Ag 5. Secondary Dressing Applied Gauze and Kerlix/Conform 7. Secured with Recruitment consultant) Signed: 11/08/2015 5:02:39 PM By: Regan Lemming BSN, RN Entered By: Regan Lemming on 11/08/2015 17:02:39 [name] ([id number]) -------------------------------------------------------------------------------- Vitals Details Patient Name: [name]. Date of Service: 11/08/2015 1:30 PM Medical Record Patient Account Number: [id number] Number: Treating RN: Baruch Gouty, RN, BSN, Rita 1961/05/17 [phone] y.o. Other Clinician: Date of Birth/Sex: Male) Treating ROBSON, [place] Primary Care Physician: Buddy Duty, JEFFREY Physician/Extender: G Referring Physician: Buddy Duty  JEFFREY Weeks in Treatment: 2 Vital Signs Time Taken: 13:47 Temperature (F): 98.2 Height (in): 70 Pulse (bpm): 92 Weight (lbs): 257 Respiratory Rate (breaths/min): 17 Body Mass Index (BMI): 36.9 Blood Pressure (mmHg): 174/85 Reference Range: 80 - 120 mg / dl Electronic Signature(s) Signed: 11/08/2015 5:24:50 PM By: Regan Lemming BSN, RN Entered By: Regan Lemming on 11/08/2015 13:48:14

## 2015-11-09 NOTE — Progress Notes (Signed)
[name] ([id number]) Visit Report for 11/08/2015 Chief Complaint Document Details Patient Name: [name], DEMO. Date of Service: 11/08/2015 1:30 PM Medical Record Patient Account Number: [id number] Number: Treating RN: [name] Gouty, RN, BSN, [name] 02-09-1961 [phone] y.o. Other Clinician: Date of Birth/Sex: Male) Treating [name] Primary Care Physician/Extender: [name] Physician: Referring Physician: Buddy Duty, [name] Weeks in Treatment: 2 Information Obtained from: Patient Chief Complaint Chronic left calf traumatic ulcer (healed). Left third toe ulcer (healed). New right calf ulcer. 10/25/15; patient returns today with a wound over his left lateral foot Electronic Signature(s) Signed: 11/08/2015 5:31:12 PM By: [name] Ham MD Entered By: [name] on 11/08/2015 14:52:25 [name] ([id number]) -------------------------------------------------------------------------------- Debridement Details Patient Name: [name]. Date of Service: 11/08/2015 1:30 PM Medical Record Patient Account Number: [id number] Number: Treating RN: [name] Gouty, RN, BSN, [name] [birth year] [phone] y.o. Other Clinician: Date of Birth/Sex: Male) Treating [name] Primary Care Physician/Extender: [name] Physician: Referring Physician: Buddy Duty, [name] Weeks in Treatment: 2 Debridement Performed for Wound #4 Left,Lateral,Plantar Foot Assessment: Performed By: Physician [name] Dillon, MD Debridement: Debridement Pre-procedure Yes Verification/Time Out Taken: Start Time: 14:02 Pain Control: Lidocaine 4% Topical Solution Level: Skin/Subcutaneous Tissue Total Area Debrided (L x 4 (cm) x 3.5 (cm) = 14 (cm) W): Tissue and other Non-Viable, Callus, Fibrin/Slough, Skin, Subcutaneous material debrided: Instrument: Blade, Forceps Bleeding: Moderate Hemostasis Achieved: Silver Nitrate End Time: 14:10 Procedural Pain: 0 Post Procedural Pain:  0 Response to Treatment: Procedure was tolerated well Post Debridement Measurements of Total Wound Length: (cm) 4 Width: (cm) 3.5 Depth: (cm) 1 Volume: (cm) 10.996 Post Procedure Diagnosis Same as Pre-procedure Electronic Signature(s) Signed: 11/08/2015 5:24:50 PM By: Regan Lemming BSN, RN Signed: 11/08/2015 5:31:12 PM By: [name] Ham MD Entered By: [name] on 11/08/2015 14:52:12 [name] ([name] ([id number]) -------------------------------------------------------------------------------- HPI Details Patient Name: [name], [name] Date of Service: 11/08/2015 1:30 PM Medical Record Patient Account Number: [id number] Number: Treating RN: [name] Gouty, RN, BSN, [name] [birth year] [phone] y.o. Other Clinician: Date of Birth/Sex: Male) Treating [name] Primary Care Physician/Extender: [name] Physician: Referring Physician: Buddy Duty, [name] Weeks in Treatment: 2 History of Present Illness HPI Description: Pleasant 55 year old with history of diabetes (Hgb A1c 10.8 in 2014) and peripheral neuropathy. No PVD. L ABI 1.1. Status post right great toe partial amputation years ago. He was at work and on 10/22/2014, was injured by a cart, and suffered an ulceration to his left anterior calf. He says that it subsequently became infected, and he was treated with a course of antibiotics. He was found on initial exam to have an ulceration on the dorsum of his left third toe. He was unaware of this and attributes it to pressure from his steel toed boots. More recently he injured his right anterior calf on a cart. Ambulating normally per his baseline. He has been undergoing regular debridements, applying mupirocin cream, and an Ace wrap for edema control. He returns to clinic for follow-up and is without complaints. No pain. No fever or chills. No drainage. 10/25/15; this is a 55 year old man who has type II diabetes with diabetic polyneuropathy. He  tells me that he fractured his left fifth metatarsal in June 2016 when he presented with swelling. He does not recall a specific injury. His hemoglobin A1c was apparently too high at the time for consideration of surgery and he was put in some form of offloading. Ultimately he went to surgery in December with an allograft from his  calcaneus to this site, plate and screws. He had an x-ray of the foot in March that showed concerns about nonunion. He tells me that in March he had to move and basically moved himself. He was on his foot a lot and then noticed some drainage from an open area. He has been following with his orthopedic surgeon Dr. Doran Durand. He has been applying a felt donut, dry dressing and using his heel healing sandal. 11/01/15; this is a patient I saw last week for the first time. He had a small open wound on the plantar aspect of his left foot at roughly the level of the base of his fifth metatarsal. He had a considerable degree of thickened skin around this wound on the plantar aspect which I thought was from chronic pressure on this area. He tells [place] that he had drainage over the course of the week. No systemic symptoms. 11/08/15; culture last week grew Citrobacter korseri. This should've been sensitive to the Augmentin I gave him. He has seen Dr. Doran Durand who did his initial surgery and according the patient the plan is to give this another month and then the hardware might need to come out of this. This seems like a reasonable plan. I will adjust his antibiotics to ciprofloxacin which probably should continue for at least another 2 weeks. I gave him 10 days worth today Electronic Signature(s) Signed: 11/08/2015 5:31:12 PM By: [name] Ham MD Entered By: [name] on 11/08/2015 14:56:01 [name] ([id number]) -------------------------------------------------------------------------------- Physical Exam Details Patient Name: [name], [name]. Date of Service: 11/08/2015  1:30 PM Medical Record Patient Account Number: [id number] Number: Treating RN: [name] Gouty, RN, BSN, [name] 1961-05-16 [phone] y.o. Other Clinician: Date of Birth/Sex: Male) Treating [name] Primary Care Physician/Extender: [name] Physician: Referring Physician: Buddy Duty, [name] Weeks in Treatment: 2 Notes On exam; the wound looks more substantial than last week. There is an opening at the base of this which clearly has exposed hardware. This is a small opening. There is serosanguineous drainage coming out of this. I debridement circumferential skin callus and subcutaneous tissue from around the superior part of the wound and also the small open area inferiorly. I have marked the degree of erythema last week this is a lot better suggesting that the antibiotics have been effective for the superficial cellulitis Electronic Signature(s) Signed: 11/08/2015 5:31:12 PM By: [name] Ham MD Entered By: [name] on 11/08/2015 14:57:07 [name] ([id number]) -------------------------------------------------------------------------------- Physician Orders Details Patient Name: [name]. Date of Service: 11/08/2015 1:30 PM Medical Record Patient Account Number: [id number] Number: Treating RN: [name] Gouty, RN, BSN, [name] [birth year] [phone] y.o. Other Clinician: Date of Birth/Sex: Male) Treating [name] Primary Care Physician/Extender: [name] Physician: Referring Physician: Mackey Birchwood in Treatment: 2 Verbal / Phone Orders: Yes Clinician: Afful, RN, BSN, [name] Read Back and Verified: Yes Diagnosis Coding Wound Cleansing Wound #4 Left,Lateral,Plantar Foot o Cleanse wound with mild soap and water o May Shower, gently pat wound dry prior to applying new dressing. o May shower with protection. Anesthetic Wound #4 Left,Lateral,Plantar Foot o Topical Lidocaine 4% cream applied to wound bed prior to debridement Primary Wound  Dressing Wound #4 Left,Lateral,Plantar Foot o Aquacel Ag Secondary Dressing Wound #4 Left,Lateral,Plantar Foot o Gauze and Kerlix/Conform Dressing Change Frequency Wound #4 Left,Lateral,Plantar Foot o Change dressing every day. Follow-up Appointments Wound #4 Left,Lateral,Plantar Foot o Return Appointment in 1 week. Off-Loading Wound #4 Left,Lateral,Plantar Foot o Other: - felt Additional Orders / Instructions Wound #  [place] ([id number]) o Increase protein intake. o Activity as tolerated Medications-please add to medication list. Wound #4 Left,Lateral,Plantar Foot o P.O. Antibiotics - Doxy 100mg  po bid x 7 days Cipro 500mg  po every 12 hrs x 7 days o Other: - Hydrocodone 5/325 q6hrs PRN Patient Medications Allergies: No Known Allergies Notifications Medication Indication Start End hydrocodone-acetaminophen wound pain 11/08/2015 DOSE oral 5 mg-325 mg tablet - tablet oral doxycycline hyclate wound infection 11/08/2015 DOSE oral 100 mg capsule - capsule oral Cipro wound infection 11/08/2015 DOSE oral 500 mg tablet - tablet oral Electronic Signature(s) Signed: 11/08/2015 2:33:39 PM By: Regan Lemming BSN, RN Signed: 11/08/2015 5:31:12 PM By: [name] Ham MD Entered By: Regan Lemming on 11/08/2015 14:33:39 [name], [name] ([id number]) -------------------------------------------------------------------------------- Prescription 11/08/2015 Patient Name: [name]. Physician: [name] Dillon MD Date of Birth: October 19, 1960 NPI#: [id number] Sex: [name] DEA#: [id number] Phone #: [phone] License #: [id number] Patient Address: [address] [place], [place] [address] [address], [place], [place] [address] [phone] Allergies No Known Allergies Physician's Orders P.O. Antibiotics - Doxy 100mg  po bid x 7 days Cipro 500mg  po  every 12 hrs x 7 days Signature(s): Date(s): [name], [name] ([id number]) Prescription 11/08/2015 Patient Name: [name], [name]. Physician: [name] Dillon MD Date of Birth: March 21, 1961 NPI#: [id number] Sex: [name] DEA#: [id number] Phone #: [phone] License #: [id number] Patient Address: [address] [place], [place] [address] [address], [place], [place] [address] [phone] Allergies No Known Allergies Physician's Orders Other: - Hydrocodone 5/325 q6hrs PRN Signature(s): Date(s): [name], [name] ([id number]) Prescription 11/08/2015 Patient Name: [name], [name]. Physician: [name] Dillon MD Date of Birth: [birth year] NPI#: [id number] Sex: [name] DEA#: [id number] Phone #: [phone] License #: [id number] Patient Address: [place] [place], [place] [address] [address] [place], [place] [address] [phone] Allergies No Known Allergies Medication Note: This prescription was automatically generated because the medication is a controlled substance, and cannot be prescribed electronically. Medication: Route: Strength: Form: hydrocodone-acetaminophen oral 5 mg-325 mg tablet Class: NARCOTIC ANALGESIC AND NON-SALICYLATE ANALGESIC Dose: Frequency / Time: Indication: tablet oral wound pain Number of Refills: Number of Units: 0 Generic Substitution: Start Date: End Date: Administered at Substitution Permitted [id number] Facility: No Note to Pharmacy: Signature(s): Date(s): [name], SARTIN ([id number]) Prescription 11/08/2015 Patient Name: MALCOME, SLAGHT. Physician: [name] Dillon MD Date of Birth: 11-15-1960 NPI#: [id number] Sex: [name] DEA#: [id number] Phone #: [phone] License #: [id number] Patient Address: [address] [place], [place] [address] [address] [place], [place] [address] [phone] Allergies No Known Allergies Medication Medication: Route: Strength: Form: doxycycline hyclate oral 100 mg capsule Class: PERIODONTAL COLLAGENASE INHIBITORS Dose: Frequency / Time: Indication: capsule oral wound infection Number of Refills: Number of Units: 0 Generic Substitution: Start Date: End Date: Administered at Substitution Permitted [id number] Facility: No Note to Pharmacy: Signature(s): Date(s): HAIDIN, BORCHERT ([id number]) Prescription 11/08/2015 Patient Name: RHET, BERNOSKY. Physician: [name] Dillon MD Date of Birth: 04-07-1961 NPI#: [id number] Sex: [name] DEA#: [id number] Phone #: [phone] License #: [id number] Patient Address: [address] [facility] [place],  [address] [address]  [place], [place] [address] [phone] Allergies No Known Allergies Medication Medication: Route: Strength: Form: Cipro oral 500 mg tablet Class: QUINOLONES Dose: Frequency / Time: Indication: tablet oral wound infection Number of Refills: Number of Units: 0 Generic Substitution: Start Date: End Date: Administered at Substitution Permitted [id number] Facility: No Note to Pharmacy: Signature(s): Date(s): SALOMON, ISIP ([id number]) Electronic Signature(s) Signed: 11/08/2015 5:24:50 PM By: Regan Lemming BSN, RN Signed: 11/08/2015 5:31:12 PM By: [name] Ham MD Entered By: Regan Lemming on 11/08/2015 14:33:39 [name] ([id number]) --------------------------------------------------------------------------------  Problem List Details Patient Name: [name], [name]. Date of Service: 11/08/2015 1:30 PM Medical Record Patient Account Number: [id number] Number: Treating RN: [name] Gouty, RN,  BSN, [name] 05-28-1961 [phone] y.o. Other Clinician: Date of Birth/Sex: Male) Treating [name] Primary Care Physician/Extender: [name] Physician: Referring Physician: Buddy Duty, [name] Weeks in Treatment: 2 Active Problems ICD-10 Encounter Code Description Active Date Diagnosis L97.521 Non-pressure chronic ulcer of other part of left foot limited 10/25/2015 Yes to breakdown of skin E11.621 Type 2 diabetes mellitus with foot ulcer 10/25/2015 Yes E11.42 Type 2 diabetes mellitus with diabetic polyneuropathy 10/25/2015 Yes L03.116 Cellulitis of left lower limb 11/01/2015 Yes Inactive Problems Resolved Problems Electronic Signature(s) Signed: 11/08/2015 5:31:12 PM By: [name] Ham MD Entered By: [name] on 11/08/2015 14:52:01 [name] ([id number]) -------------------------------------------------------------------------------- Progress Note Details Patient Name: [name]. Date of Service: 11/08/2015 1:30 PM Medical Record Patient Account Number: [id number] Number: Treating RN: [name] Gouty, RN, BSN, [name] 12-07-1960 [phone] y.o. Other Clinician: Date of Birth/Sex: Male) Treating [name] Primary Care Physician/Extender: [name] Physician: Referring Physician: Buddy Duty, [name] Weeks in Treatment: 2 Subjective Chief Complaint Information obtained from Patient Chronic left calf traumatic ulcer (healed). Left third toe ulcer (healed). New right calf ulcer. 10/25/15; patient returns today with a wound over his left lateral foot History of Present Illness (HPI) Pleasant 55 year old with history of diabetes (Hgb A1c 10.8 in 2014) and peripheral neuropathy. No PVD. L ABI 1.1. Status post right great toe partial amputation years ago. He was at work and on 10/22/2014, was injured by a cart, and suffered an ulceration to his left anterior calf. He says that it subsequently became infected, and he was treated with a course of antibiotics. He was  found on initial exam to have an ulceration on the dorsum of his left third toe. He was unaware of this and attributes it to pressure from his steel toed boots. More recently he injured his right anterior calf on a cart. Ambulating normally per his baseline. He has been undergoing regular debridements, applying mupirocin cream, and an Ace wrap for edema control. He returns to clinic for follow-up and is without complaints. No pain. No fever or chills. No drainage. 10/25/15; this is a 55 year old man who has type II diabetes with diabetic polyneuropathy. He tells me that he fractured his left fifth metatarsal in June 2016 when he presented with swelling. He does not recall a specific injury. His hemoglobin A1c was apparently too high at the time for consideration of surgery and he was put in some form of offloading. Ultimately he went to surgery in December with an allograft from his calcaneus to this site, plate and screws. He had an x-ray of the foot in March that showed concerns about nonunion. He tells me that in March he had to move and basically moved himself. He was on his foot a lot and then noticed some drainage from an open area. He has been following  with his orthopedic surgeon Dr. Doran Durand. He has been applying a felt donut, dry dressing and using his heel healing sandal. 11/01/15; this is a patient I saw last week for the first time. He had a small open wound on the plantar aspect of his left foot at roughly the level of the base of his fifth metatarsal. He had a considerable degree of thickened skin around this wound on the plantar aspect which I thought was from chronic pressure on this area. He tells [place] that he had drainage over the course of the week. No systemic symptoms. 11/08/15; culture last week grew Citrobacter korseri. This should've been sensitive to the Augmentin I gave him. He has seen Dr. Doran Durand who did his initial surgery and according the patient the plan is to give  this another month and then the hardware might need to come out of this. This seems like a reasonable plan. I will adjust his antibiotics to ciprofloxacin which probably should continue for at least another 2 weeks. I gave him 10 days worth today [name], [name] ([id number]) Objective Constitutional Vitals Time Taken: 1:47 PM, Height: 70 in, Weight: 257 lbs, BMI: 36.9, Temperature: 98.2 F, Pulse: 92 bpm, Respiratory Rate: 17 breaths/min, Blood Pressure: 174/85 mmHg. Integumentary (Hair, Skin) Wound #4 status is Open. Original cause of wound was Pressure Injury. The wound is located on the [place]. The wound measures 4cm length x 3.5cm width x 1cm depth; 10.996cm^2 area and 10.996cm^3 volume. The wound is limited to skin breakdown. There is no tunneling or undermining noted. There is a medium amount of purulent drainage noted. The wound margin is distinct with the outline attached to the wound base. There is medium (34-66%) pink granulation within the wound bed. There is a medium (34-66%) amount of necrotic tissue within the wound bed including Adherent Slough. The periwound skin appearance exhibited: Callus, Moist. The periwound skin appearance did not exhibit: Crepitus, Excoriation, Fluctuance, Friable, Induration, Localized Edema, Rash, Scarring, Dry/Scaly, Maceration, Atrophie Blanche, Cyanosis, Ecchymosis, Hemosiderin Staining, Mottled, Pallor, Rubor, Erythema. Periwound temperature was noted as No Abnormality. The periwound has tenderness on palpation. Assessment Active Problems ICD-10 L97.521 - Non-pressure chronic ulcer of other part of left foot limited to breakdown of skin E11.621 - Type 2 diabetes mellitus with foot ulcer E11.42 - Type 2 diabetes mellitus with diabetic polyneuropathy L03.116 - Cellulitis of left lower limb Procedures Wound #4 Wound #4 is a Diabetic Wound/Ulcer of the Lower Extremity located on the Left,Lateral,Plantar Foot . There was a  Skin/Subcutaneous Tissue Debridement [id number]) debridement with total area of 14 sq cm [name], [name]. ([id number]) performed by [name] Dillon, MD. with the following instrument(s): Blade and Forceps to remove Non-Viable tissue/material including Fibrin/Slough, Skin, Callus, and Subcutaneous after achieving pain control using Lidocaine 4% Topical Solution. A time out was conducted prior to the start of the procedure. A Moderate amount of bleeding was controlled with Silver Nitrate. The procedure was tolerated well with a pain level of 0 throughout and a pain level of 0 following the procedure. Post Debridement Measurements: 4cm length x 3.5cm width x 1cm depth; 10.996cm^3 volume. Post procedure Diagnosis Wound #4: Same as Pre-Procedure Plan Wound Cleansing: Wound #4 Left,Lateral,Plantar Foot: Cleanse wound with mild soap and water May Shower, gently pat wound dry prior to applying new dressing. May shower with protection. Anesthetic: Wound #4 Left,Lateral,Plantar Foot: Topical Lidocaine 4% cream applied to wound bed prior to debridement Primary Wound Dressing: Wound #4 Left,Lateral,Plantar Foot: Aquacel Ag Secondary Dressing: Wound #  4 Left,Lateral,Plantar Foot: Gauze and Kerlix/Conform Dressing Change Frequency: Wound #4 Left,Lateral,Plantar Foot: Change dressing every day. Follow-up Appointments: Wound #4 Left,Lateral,Plantar Foot: Return Appointment in 1 week. Off-Loading: Wound #4 Left,Lateral,Plantar Foot: Other: - felt Additional Orders / Instructions: Wound #4 Left,Lateral,Plantar Foot: Increase protein intake. Activity as tolerated Medications-please add to medication list.: Wound #4 Left,Lateral,Plantar Foot: P.O. Antibiotics - Doxy 100mg  po bid x 7 days Cipro 500mg  po every 12 hrs x 7 days Other: - Hydrocodone 5/325 q6hrs PRN The following medication(s) was prescribed: hydrocodone-acetaminophen oral 5 mg-325 mg tablet tablet oral for wound pain starting  11/08/2015 doxycycline hyclate oral 100 mg capsule capsule oral for wound infection starting 11/08/2015 ALLANMICHAEL, PREISER. ([id number]) Cipro oral 500 mg tablet tablet oral for wound infection starting 11/08/2015 #1 I gave him a prescription for ciprofloxacin 500 twice a day for 10 days and I may need to extend this longer than that. #2 there is exposed hardware here albeit through a small open area on the inferior aspect of the wound #3 continue with Aquacel Ag and religious offloading #4 states he followed with Dr. Doran Durand last week and apparently has a follow-up for early June. Last x-ray of the foot I see was on 3/22 at which point the fracture line was not healed status post ORIF of the fifth metatarsal fracture. Surgery was in early January I believe Electronic Signature(s) Signed: 11/08/2015 5:31:12 PM By: [name] Ham MD Entered By: [name] on 11/08/2015 14:59:35 [name] ([id number]) -------------------------------------------------------------------------------- SuperBill Details Patient Name: [name]. Date of Service: 11/08/2015 Medical Record Patient Account Number: [id number] Number: Treating RN: [name] Gouty, RN, BSN, [name] [birth year] [phone] y.o. Other Clinician: Date of Birth/Sex: Male) Treating [name] Primary Care Physician: [name] Physician/Extender: G Referring Physician: Buddy Duty, [name] Weeks in Treatment: 2 Diagnosis Coding ICD-10 Codes Code Description L97.521 Non-pressure chronic ulcer of other part of left foot limited to breakdown of skin E11.621 Type 2 diabetes mellitus with foot ulcer E11.42 Type 2 diabetes mellitus with diabetic polyneuropathy L03.116 Cellulitis of left lower limb Facility Procedures CPT4: Description Modifier Quantity Code [id number] - DEB SUBQ TISSUE 20 SQ CM/< 1 ICD-10 Description Diagnosis L97.521 Non-pressure chronic ulcer of other part of left foot limited to breakdown of skin Physician  Procedures CPT4: Description Modifier Quantity Code [id number] - WC PHYS SUBQ TISS 20 SQ CM 1 ICD-10 Description Diagnosis L97.521 Non-pressure chronic ulcer of other part of left foot limited to breakdown of skin Electronic Signature(s) Signed: 11/08/2015 5:31:12 PM By: [name] Ham MD Entered By: [name] on 11/08/2015 15:00:15

## 2015-11-22 ENCOUNTER — Encounter: Payer: Managed Care, Other (non HMO) | Admitting: Internal Medicine

## 2015-11-22 DIAGNOSIS — E11621 Type 2 diabetes mellitus with foot ulcer: Secondary | ICD-10-CM | POA: Diagnosis not present

## 2015-11-23 NOTE — Progress Notes (Signed)
[name] ([id number]) Visit Report for 11/22/2015 Arrival Information Details Patient Name: [name], [name]. Date of Service: 11/22/2015 1:30 PM Medical Record Patient Account Number: [id number] Number: Treating RN: Baruch Gouty, RN, BSN, Rita 31-Oct-1960 [phone] y.o. Other Clinician: Date of Birth/Sex: Male) Treating ROBSON, MICHAEL Primary Care Physician: Buddy Duty, JEFFREY Physician/Extender: G Referring Physician: Buddy Duty, JEFFREY Weeks in Treatment: 4 Visit Information History Since Last Visit Added or deleted any medications: No Patient Arrived: Ambulatory Any new allergies or adverse reactions: No Arrival Time: 13:46 Had a fall or experienced change in No Accompanied By: self activities of daily living that may affect Transfer Assistance: None risk of falls: Patient Identification Verified: Yes Signs or symptoms of abuse/neglect since last No Secondary Verification Process Yes visito Completed: Hospitalized since last visit: No Patient Requires Transmission-Based No Has Dressing in Place as Prescribed: Yes Precautions: Pain Present Now: No Patient Has Alerts: No Electronic Signature(s) Signed: 11/22/2015 4:54:14 PM By: Regan Lemming BSN, RN Entered By: Regan Lemming on 11/22/2015 13:46:28 [name] ([id number]) -------------------------------------------------------------------------------- Clinic Level of Care Assessment Details Patient Name: [name]. Date of Service: 11/22/2015 1:30 PM Medical Record Patient Account Number: [id number] Number: Treating RN: Baruch Gouty, RN, BSN, Rita Nov 07, 1960 [phone] y.o. Other Clinician: Date of Birth/Sex: Male) Treating ROBSON, [place] Primary Care Physician: Buddy Duty, JEFFREY Physician/Extender: G Referring Physician: Buddy Duty, JEFFREY Weeks in Treatment: 4 Clinic Level of Care Assessment Items TOOL 4 Quantity Score []  - Use when only an EandM is performed on FOLLOW-UP visit 0 ASSESSMENTS - Nursing Assessment /  Reassessment X - Reassessment of Co-morbidities (includes updates in patient status) 1 10 X - Reassessment of Adherence to Treatment Plan 1 5 ASSESSMENTS - Wound and Skin Assessment / Reassessment X - Simple Wound Assessment / Reassessment - one wound 1 5 []  - Complex Wound Assessment / Reassessment - multiple wounds 0 []  - Dermatologic / Skin Assessment (not related to wound area) 0 ASSESSMENTS - Focused Assessment []  - Circumferential Edema Measurements - multi extremities 0 []  - Nutritional Assessment / Counseling / Intervention 0 X - Lower Extremity Assessment (monofilament, tuning fork, pulses) 1 5 []  - Peripheral Arterial Disease Assessment (using hand held doppler) 0 ASSESSMENTS - Ostomy and/or Continence Assessment and Care []  - Incontinence Assessment and Management 0 []  - Ostomy Care Assessment and Management (repouching, etc.) 0 PROCESS - Coordination of Care X - Simple Patient / Family Education for ongoing care 1 15 []  - Complex (extensive) Patient / Family Education for ongoing care 0 []  - Staff obtains Programmer, systems, Records, Test Results / Process Orders 0 []  - Staff telephones HHA, Nursing Homes / Clarify orders / etc 0 INDALECIO, VAJDA. ([id number]) []  - Routine Transfer to another Facility (non-emergent condition) 0 []  - Routine Hospital Admission (non-emergent condition) 0 []  - New Admissions / Biomedical engineer / Ordering NPWT, Apligraf, etc. 0 []  - Emergency Hospital Admission (emergent condition) 0 []  - Simple Discharge Coordination 0 []  - Complex (extensive) Discharge Coordination 0 PROCESS - Special Needs []  - Pediatric / Minor Patient Management 0 []  - Isolation Patient Management 0 []  - Hearing / Language / Visual special needs 0 []  - Assessment of Community assistance (transportation, D/C planning, etc.) 0 []  - Additional assistance / Altered mentation 0 []  - Support Surface(s) Assessment (bed, cushion, seat, etc.) 0 INTERVENTIONS - Wound Cleansing /  Measurement X - Simple Wound Cleansing - one wound 1 5 []  - Complex Wound Cleansing - multiple wounds 0 X - Wound Imaging (photographs - any  number of wounds) 1 5 []  - Wound Tracing (instead of photographs) 0 X - Simple Wound Measurement - one wound 1 5 []  - Complex Wound Measurement - multiple wounds 0 INTERVENTIONS - Wound Dressings X - Small Wound Dressing one or multiple wounds 1 10 []  - Medium Wound Dressing one or multiple wounds 0 []  - Large Wound Dressing one or multiple wounds 0 []  - Application of Medications - topical 0 []  - Application of Medications - injection 0 KAHLI, DEROGATIS. ([id number]) INTERVENTIONS - Miscellaneous []  - External ear exam 0 []  - Specimen Collection (cultures, biopsies, blood, body fluids, etc.) 0 []  - Specimen(s) / Culture(s) sent or taken to Lab for analysis 0 []  - Patient Transfer (multiple staff / Harrel Lemon Lift / Similar devices) 0 []  - Simple Staple / Suture removal (25 or less) 0 []  - Complex Staple / Suture removal (26 or more) 0 []  - Hypo / Hyperglycemic Management (close monitor of Blood Glucose) 0 []  - Ankle / Brachial Index (ABI) - do not check if billed separately 0 X - Vital Signs 1 5 Has the patient been seen at the hospital within the last three years: Yes Total Score: 70 Level Of Care: New/Established - Level 2 Electronic Signature(s) Signed: 11/22/2015 4:54:14 PM By: Regan Lemming BSN, RN Entered By: Regan Lemming on 11/22/2015 14:27:50 [name] ([id number]) -------------------------------------------------------------------------------- Encounter Discharge Information Details Patient Name: [name]. Date of Service: 11/22/2015 1:30 PM Medical Record Patient Account Number: [id number] Number: Treating RN: Baruch Gouty, RN, BSN, Rita 1961/01/30 [phone] y.o. Other Clinician: Date of Birth/Sex: Male) Treating ROBSON, MICHAEL Primary Care Physician: Buddy Duty, JEFFREY Physician/Extender: G Referring Physician: Buddy Duty,  JEFFREY Weeks in Treatment: 4 Encounter Discharge Information Items Discharge Pain Level: 0 Discharge Condition: Stable Ambulatory Status: Ambulatory Discharge Destination: Home Transportation: Private Auto Accompanied By: SELF Schedule Follow-up Appointment: No Medication Reconciliation completed and provided to Patient/Care No Merryn Thaker: Provided on Clinical Summary of Care: 11/22/2015 Form Type Recipient Paper Patient HP Electronic Signature(s) Signed: 11/22/2015 4:54:14 PM By: Regan Lemming BSN, RN Previous Signature: 11/22/2015 2:25:15 PM Version By: Ruthine Dose Entered By: Regan Lemming on 11/22/2015 14:28:38 [name] ([id number]) -------------------------------------------------------------------------------- Lower Extremity Assessment Details Patient Name: [name]. Date of Service: 11/22/2015 1:30 PM Medical Record Patient Account Number: [id number] Number: Treating RN: Baruch Gouty, RN, BSN, Rita 1961/06/08 [phone] y.o. Other Clinician: Date of Birth/Sex: Male) Treating ROBSON, [place] Primary Care Physician: Buddy Duty, JEFFREY Physician/Extender: G Referring Physician: Buddy Duty, JEFFREY Weeks in Treatment: 4 Edema Assessment Assessed: [Left: No] [Right: No] Edema: [Left: Ye] [Right: s] Calf Left: Right: Point of Measurement: 36 cm From Medial Instep cm cm Ankle Left: Right: Point of Measurement: 10 cm From Medial Instep cm cm Vascular Assessment Pulses: Posterior Tibial Dorsalis Pedis Palpable: [Left:Yes] Extremity colors, hair growth, and conditions: Extremity Color: [Left:Hyperpigmented] Hair Growth on Extremity: [Left:Yes] Temperature of Extremity: [Left:Warm] Capillary Refill: [Left:< 3 seconds] Toe Nail Assessment Left: Right: Thick: Yes Discolored: Yes Deformed: No Improper Length and Hygiene: Yes Electronic Signature(s) Signed: 11/22/2015 4:54:14 PM By: Regan Lemming BSN, RN Entered By: Regan Lemming on 11/22/2015 13:49:10 [name]  ([id number] Brood, [name] ([id number]) -------------------------------------------------------------------------------- Multi Wound Chart Details Patient Name: [name]. Date of Service: 11/22/2015 1:30 PM Medical Record Patient Account Number: [id number] Number: Treating RN: Baruch Gouty, RN, BSN, Rita Sep 10, 1960 [phone] y.o. 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] Comorbid History: [4:Hypertension, Type II Diabetes, Neuropathy] [N/A:N/A] Date Acquired: [4:07/12/2015] [N/A:N/A] Weeks of Treatment: [4:4] [N/A:N/A] Wound Status: [4:Open] [N/A:N/A] Measurements L x W x D 3.3x1.2x0.1 [N/A:N/A] (cm) Area (cm) : [4:3.11] [N/A:N/A] Volume (cm) : [4:0.311] [N/A:N/A] % Reduction in Area: [4:-296.20%] [N/A:N/A] % Reduction in Volume: -31.80% [N/A:N/A] Classification: [4:Grade 1] [N/A:N/A] Exudate Amount: [4:Large] [N/A:N/A] Exudate Type: [4:Serosanguineous] [N/A:N/A] Exudate Color: [4:red, brown] [N/A:N/A] Wound Margin: [4:Distinct, outline attached N/A] Granulation Amount: [4:Medium (34-66%)] [N/A:N/A] Granulation Quality: [4:Pink] [N/A:N/A] Necrotic Amount: [4:Medium (34-66%)] [N/A:N/A] Exposed Structures: [4:Fascia: No Fat: No Tendon: No Muscle: No] [N/A:N/A] Joint: No Bone: No Limited to Skin Breakdown Epithelialization: None N/A N/A Periwound Skin Texture: Callus: Yes N/A N/A Edema: No Excoriation: No Induration: No Crepitus: No Fluctuance:  No Friable: No Rash: No Scarring: No Periwound Skin Moist: Yes N/A N/A Moisture: Maceration: No Dry/Scaly: No Periwound Skin Color: Atrophie Blanche: No N/A N/A Cyanosis: No Ecchymosis: No Erythema: No Hemosiderin Staining: No Mottled: No Pallor: No Rubor: No Temperature: No Abnormality N/A N/A Tenderness on Yes N/A N/A Palpation: Wound Preparation: Ulcer Cleansing: N/A N/A Rinsed/Irrigated with Saline Topical Anesthetic Applied: Other: lidocaine 4% Treatment Notes Electronic Signature(s) Signed: 11/22/2015 4:54:14 PM By: Regan Lemming BSN, RN Entered By: Regan Lemming on 11/22/2015 13:57:48 [name] ([id number]) -------------------------------------------------------------------------------- [place] Details Patient Name: JENNA, STEIGHNER. Date of Service: 11/22/2015 1:30 PM Medical Record Patient Account Number: [id number] Number: Treating RN: Baruch Gouty, RN, BSN, Rita [birth year] [phone] y.o. Other Clinician: Date of Birth/Sex: Male) Treating ROBSON, [place] Primary Care Physician: Buddy Duty, JEFFREY Physician/Extender: G Referring Physician: Buddy Duty, JEFFREY Weeks in Treatment: 4 Active Inactive Orientation to the Wound Care Program Nursing Diagnoses: Knowledge deficit related to the wound healing center program Goals: Patient/caregiver will verbalize understanding of the [facility] [place] [facility] Date Initiated: 10/25/2015 Goal Status: Active Interventions: Provide education on orientation to the wound center Notes: Peripheral Neuropathy Nursing Diagnoses: Knowledge deficit related to disease process and management of peripheral neurovascular dysfunction Potential alteration in peripheral tissue perfusion (select prior to confirmation of diagnosis) Goals: Patient/caregiver will verbalize understanding of disease process and disease management Date Initiated: 10/25/2015 Goal Status: Active Interventions: Assess signs and symptoms of  neuropathy upon admission and as needed Provide education on Management of Neuropathy and Related Ulcers Provide education on Management of Neuropathy upon discharge from the [facility] Activities: Test ordered outside of clinic : 10/25/2015 Notes: ZIGMUNT, CAUGHELL ([id number]) Wound/Skin Impairment Nursing Diagnoses: Impaired tissue integrity Knowledge deficit related to smoking impact on wound healing Knowledge deficit related to ulceration/compromised skin integrity Goals: Patient/caregiver will verbalize understanding of skin care regimen Date Initiated: 10/25/2015 Goal Status: Active Ulcer/skin breakdown will have a volume reduction of 30% by week 4 Date Initiated: 10/25/2015 Goal Status: Active Ulcer/skin breakdown will have a volume reduction of 50% by week 8 Date Initiated: 10/25/2015 Goal Status: Active Ulcer/skin breakdown will have a volume reduction of 80% by week 12 Date Initiated: 10/25/2015 Goal Status: Active Ulcer/skin breakdown will heal within 14 weeks Date Initiated: 10/25/2015 Goal Status: Active Interventions: Assess patient/caregiver ability to obtain necessary supplies Assess patient/caregiver ability to perform ulcer/skin care regimen upon admission and as needed Provide education on ulcer and skin care Treatment Activities: Skin care  regimen initiated : 10/25/2015 Topical wound management initiated : 10/25/2015 Notes: Electronic Signature(s) Signed: 11/22/2015 4:54:14 PM By: Regan Lemming BSN, RN Entered By: Regan Lemming on 11/22/2015 13:57:29 [name] ([id number]) -------------------------------------------------------------------------------- Pain Assessment Details Patient Name: [name]. Date of Service: 11/22/2015 1:30 PM Medical Record Patient Account Number: [id number] Number: Treating RN: Baruch Gouty, RN, BSN, Rita 1961/06/29 [phone] y.o. Other Clinician: Date of Birth/Sex: Male) Treating ROBSON, MICHAEL Primary Care  Physician: Buddy Duty, JEFFREY Physician/Extender: G Referring Physician: Buddy Duty, JEFFREY Weeks in Treatment: 4 Active Problems Location of Pain Severity and Description of Pain Patient Has Paino No Site Locations Pain Management and Medication Current Pain Management: Electronic Signature(s) Signed: 11/22/2015 4:54:14 PM By: Regan Lemming BSN, RN Entered By: Regan Lemming on 11/22/2015 13:46:34 [name] ([id number]) -------------------------------------------------------------------------------- Patient/Caregiver Education Details Patient Name: [name]. Date of Service: 11/22/2015 1:30 PM Medical Record Patient Account Number: [id number] Number: Treating RN: Baruch Gouty, RN, BSN, Rita Jan 14, 1961 [phone] y.o. Other Clinician: Date of Birth/Gender: Male) Treating ROBSON, [place] Primary Care Physician: Buddy Duty, JEFFREY Physician/Extender: G Referring Physician: Mackey [place] in Treatment: 4 Education Assessment Education Provided To: Patient Education Topics Provided Peripheral Neuropathy: Methods: Explain/Verbal Responses: State content correctly Welcome To [facility]: Methods: Explain/Verbal Responses: State content correctly Wound/Skin Impairment: Methods: Explain/Verbal Responses: State content correctly Electronic Signature(s) Signed: 11/22/2015 4:54:14 PM By: Regan Lemming BSN, RN Entered By: Regan Lemming on 11/22/2015 14:29:00 [name] ([id number]) -------------------------------------------------------------------------------- Wound Assessment Details Patient Name: [name]. Date of Service: 11/22/2015 1:30 PM Medical Record Patient Account Number: [id number] Number: Treating RN: Baruch Gouty, RN, BSN, Rita 1960-12-05 [phone] y.o. Other Clinician: Date of Birth/Sex: Male) Treating ROBSON, MICHAEL Primary Care Physician: Buddy Duty, JEFFREY Physician/Extender: G Referring Physician: Buddy Duty, JEFFREY Weeks in Treatment: 4 Wound  Status Wound Number: 4 Primary Diabetic Wound/Ulcer of the Lower Etiology: Extremity Wound Location: Left Foot - Plantar, Lateral Wound Status: Open Wounding Event: Pressure Injury Comorbid Hypertension, Type II Diabetes, Date Acquired: 07/12/2015 History: Neuropathy Weeks Of Treatment: 4 Clustered Wound: No Photos Photo Uploaded By: Regan Lemming on 11/22/2015 16:53:32 Wound Measurements Length: (cm) 3.3 Width: (cm) 1.2 Depth: (cm) 0.1 Area: (cm) 3.11 Volume: (cm) 0.311 % Reduction in Area: -296.2% % Reduction in Volume: -31.8% Epithelialization: None Tunneling: No Undermining: No Wound Description Classification: Grade 1 Wound Margin: Distinct, outline attached Exudate Amount: Large Exudate Type: Serosanguineous Exudate Color: red, brown Foul Odor After Cleansing: No Wound Bed Granulation Amount: Medium (34-66%) Exposed Structure Granulation Quality: Pink Fascia Exposed: No Necrotic Amount: Medium (34-66%) Fat Layer Exposed: No FAIZAN, EDHOLM ([id number]) Necrotic Quality: Adherent Slough Tendon Exposed: No Muscle Exposed: No Joint Exposed: No Bone Exposed: No Limited to Skin Breakdown Periwound Skin Texture Texture Color No Abnormalities Noted: No No Abnormalities Noted: No Callus: Yes Atrophie Blanche: No Crepitus: No Cyanosis: No Excoriation: No Ecchymosis: No Fluctuance: No Erythema: No Friable: No Hemosiderin Staining: No Induration: No Mottled: No Localized Edema: No Pallor: No Rash: No Rubor: No Scarring: No Temperature / Pain Moisture Temperature: No Abnormality No Abnormalities Noted: No Tenderness on Palpation: Yes Dry / Scaly: No Maceration: No Moist: Yes Wound Preparation Ulcer Cleansing: Rinsed/Irrigated with Saline Topical Anesthetic Applied: Other: lidocaine 4%, Treatment Notes Wound #4 (Left, Lateral, Plantar Foot) 1. Cleansed with: Clean wound with Normal Saline 4. Dressing Applied: Aquacel Ag 5. Secondary Dressing  Applied Gauze and Kerlix/Conform 7. Secured with Recruitment consultant) Signed: 11/22/2015 4:54:14 PM By: Regan Lemming BSN, RN Entered By: Regan Lemming on 11/22/2015  [id number] [facility] ([id number]) -------------------------------------------------------------------------------- Vitals Details Patient Name: TECUMSEH, JAVADI. Date of Service: 11/22/2015 1:30 PM Medical Record Patient Account Number: [id number] Number: Treating RN: Baruch Gouty, RN, BSN, Rita [birth year] [phone] y.o. Other Clinician: Date of Birth/Sex: Male) Treating ROBSON, [place] Primary Care Physician: Buddy Duty, JEFFREY Physician/Extender: G Referring Physician: Buddy Duty, JEFFREY Weeks in Treatment: 4 Vital Signs Time Taken: 13:47 Temperature (F): 98.4 Height (in): 70 Pulse (bpm): 101 Weight (lbs): 257 Respiratory Rate (breaths/min): 17 Body Mass Index (BMI): 36.9 Blood Pressure (mmHg): 138/89 Reference Range: 80 - 120 mg / dl Electronic Signature(s) Signed: 11/22/2015 4:54:14 PM By: Regan Lemming BSN, RN Entered By: Regan Lemming on 11/22/2015 13:48:39

## 2015-11-23 NOTE — Progress Notes (Signed)
EBER, HAWK ([id number]) Visit Report for 11/22/2015 Chief Complaint Document Details Patient Name: [name], [name]. Date of Service: 11/22/2015 1:30 PM Medical Record Patient Account Number: [id number] Number: Treating RN: Baruch Gouty, RN, BSN, Rita 1961-01-02 [phone] y.o. Other Clinician: Date of Birth/Sex: Male) Treating ROBSON, MICHAEL Primary Care Physician/Extender: Rose Fillers, JEFFREY Physician: Referring Physician: Buddy Duty, JEFFREY Weeks in Treatment: 4 Information Obtained from: Patient Chief Complaint Chronic left calf traumatic ulcer (healed). Left third toe ulcer (healed). New right calf ulcer. 10/25/15; patient returns today with a wound over his left lateral foot Electronic Signature(s) Signed: 11/22/2015 4:27:33 PM By: [name] MD Entered By: [name] on 11/22/2015 14:14:47 [name] ([id number]) -------------------------------------------------------------------------------- HPI Details Patient Name: [name], [name]. Date of Service: 11/22/2015 1:30 PM Medical Record Patient Account Number: [id number] Number: Treating RN: Baruch Gouty, RN, BSN, Rita [birth year] [phone] y.o. Other Clinician: Date of Birth/Sex: Male) Treating ROBSON, MICHAEL Primary Care Physician/Extender: Rose Fillers, JEFFREY Physician: Referring Physician: Buddy Duty, JEFFREY Weeks in Treatment: 4 History of Present Illness HPI Description: Pleasant 55 year old with history of diabetes (Hgb A1c 10.8 in 2014) and peripheral neuropathy. No PVD. L ABI 1.1. Status post right great toe partial amputation years ago. He was at work and on 10/22/2014, was injured by a cart, and suffered an ulceration to his left anterior calf. He says that it subsequently became infected, and he was treated with a course of antibiotics. He was found on initial exam to have an ulceration on the dorsum of his left third toe. He was unaware of this and attributes it to pressure from his steel toed boots. More  recently he injured his right anterior calf on a cart. Ambulating normally per his baseline. He has been undergoing regular debridements, applying mupirocin cream, and an Ace wrap for edema control. He returns to clinic for follow-up and is without complaints. No pain. No fever or chills. No drainage. 10/25/15; this is a 55 year old man who has type II diabetes with diabetic polyneuropathy. He tells me that he fractured his left fifth metatarsal in June 2016 when he presented with swelling. He does not recall a specific injury. His hemoglobin A1c was apparently too high at the time for consideration of surgery and he was put in some form of offloading. Ultimately he went to surgery in December with an allograft from his calcaneus to this site, plate and screws. He had an x-ray of the foot in March that showed concerns about nonunion. He tells me that in March he had to move and basically moved himself. He was on his foot a lot and then noticed some drainage from an open area. He has been following with his orthopedic surgeon Dr. Doran Durand. He has been applying a felt donut, dry dressing and using his heel healing sandal. 11/01/15; this is a patient I saw last week for the first time. He had a small open wound on the plantar aspect of his left foot at roughly the level of the base of his fifth metatarsal. He had a considerable degree of thickened skin around this wound on the plantar aspect which I thought was from chronic pressure on this area. He tells [place] that he had drainage over the course of the week. No systemic symptoms. 11/08/15; culture last week grew Citrobacter korseri. This should've been sensitive to the Augmentin I gave him. He has seen Dr. Doran Durand who did his initial surgery and according the patient the plan is to give this another month and then the hardware  might need to come out of this. This seems like a reasonable plan. I will adjust his antibiotics to ciprofloxacin which probably  should continue for at least another 2 weeks. I gave him 10 days worth today 11/22/15 the patient has completed antibiotics. He has an appointment with Dr. Doran Durand this Friday. There is improved dimensions around the wound on the left fifth metatarsal base Electronic Signature(s) Signed: 11/22/2015 4:27:33 PM By: [name] MD Entered By: [name] on 11/22/2015 14:15:36 [name], [name] ([id number]) [name], [name] ([id number]) -------------------------------------------------------------------------------- Physical Exam Details Patient Name: [name], [name]. Date of Service: 11/22/2015 1:30 PM Medical Record Patient Account Number: [id number] Number: Treating RN: Baruch Gouty, RN, BSN, Rita 28-May-1961 [phone] y.o. Other Clinician: Date of Birth/Sex: Male) Treating ROBSON, MICHAEL Primary Care Physician/Extender: Rose Fillers, [place] Physician: Referring Physician: Buddy Duty, JEFFREY Weeks in Treatment: 4 Constitutional Sitting or standing Blood Pressure is within target range for patient.. Pulse regular and within target range for patient.Marland Kitchen Respirations regular, non-labored and within target range.. Temperature is normal and within the target range for the patient.. Patient's appearance is neat and clean. Appears in no acute distress. Well nourished and well developed.. Eyes Conjunctivae clear. No discharge. No scleral icterus. Cardiovascular Pedal pulses palpable and strong bilaterally.. Lymphatic None palpable in the popliteal or inguinal area. Integumentary (Hair, Skin) The wound is at the base of the left fifth metatarsal. Dimensions are now nicely surface of the wound appears better and the wound appears to of filled than [I can no longer see exposed hardware] he still has thick surrounding skin and rolled senescent edges around the wound which may ultimately need debridement. Psychiatric No evidence of depression, anxiety, or agitation. Calm, cooperative, and  communicative. Appropriate interactions and affect.. Notes Wound appears to of filled in. I cannot see any exposed hardware. No evidence of surrounding infection Electronic Signature(s) Signed: 11/22/2015 4:27:33 PM By: [name] MD Entered By: [name] on 11/22/2015 14:17:51 [name] ([id number]) -------------------------------------------------------------------------------- Physician Orders Details Patient Name: [name]. Date of Service: 11/22/2015 1:30 PM Medical Record Patient Account Number: [id number] Number: Treating RN: Baruch Gouty, RN, BSN, Rita [birth year] [phone] y.o. Other Clinician: Date of Birth/Sex: Male) Treating ROBSON, MICHAEL Primary Care Physician/Extender: Rose Fillers, JEFFREY Physician: Referring Physician: Mackey [place] in Treatment: 4 Verbal / Phone Orders: Yes Clinician: Afful, RN, BSN, Rita Read Back and Verified: Yes Diagnosis Coding Wound Cleansing Wound #4 Left,Lateral,Plantar Foot o Cleanse wound with mild soap and water o May Shower, gently pat wound dry prior to applying new dressing. o May shower with protection. Anesthetic Wound #4 Left,Lateral,Plantar Foot o Topical Lidocaine 4% cream applied to wound bed prior to debridement Primary Wound Dressing Wound #4 Left,Lateral,Plantar Foot o Aquacel Ag Secondary Dressing Wound #4 Left,Lateral,Plantar Foot o Gauze and Kerlix/Conform Dressing Change Frequency Wound #4 Left,Lateral,Plantar Foot o Change dressing every day. Follow-up Appointments Wound #4 Left,Lateral,Plantar Foot o Return Appointment in 1 week. Off-Loading Wound #4 Left,Lateral,Plantar Foot o Other: - felt Additional Orders / Instructions Wound #4 Left,Lateral,Plantar Foot [name], [name]. ([id number]) o Increase protein intake. o Activity as tolerated Medications-please add to medication list. Wound #4 Left,Lateral,Plantar Foot o P.O. Antibiotics - Doxy 100mg  po bid x  7 days Cipro 500mg  po every 12 hrs x 7 days o Other: - Hydrocodone 5/325 q6hrs PRN Electronic Signature(s) Signed: 11/22/2015 4:27:33 PM By: [name] MD Signed: 11/22/2015 4:54:14 PM By: Regan Lemming BSN, RN Entered By: Regan Lemming on 11/22/2015 14:27:12 [name] ([id number]) --------------------------------------------------------------------------------  Prescription 11/22/2015 Patient Name: [name], [name]. Physician: Ricard Dillon MD Date of Birth: 05/19/1961 NPI#: [id number] Sex: [name] DEA#: [id number] Phone #: [phone] License #: [id number] Patient Address: [address] [place], [place] [address] [address] [place], [place] [address] [phone] Allergies No Known Allergies Physician's Orders P.O. Antibiotics - Doxy 100mg  po bid x 7 days Cipro 500mg  po every 12 hrs x 7 days Signature(s): Date(s): BUZZ, VITULLI ([id number]) Prescription 11/22/2015 Patient Name: [name], [name]. Physician: Ricard Dillon MD Date of Birth: December 01, 1960 NPI#: [id number] Sex: [name] DEA#: [id number] Phone #: [phone] License #: [id number] Patient Address: [address] [place], [place] [address] [address], [place], [place] [address] [phone] Allergies No Known Allergies Physician's Orders Other: - Hydrocodone 5/325 q6hrs PRN Signature(s): Date(s): Electronic Signature(s) Signed: 11/22/2015 4:27:33 PM By: [name] MD Signed: 11/22/2015 4:54:14 PM By: Regan Lemming BSN, RN Entered By: Regan Lemming on 11/22/2015 14:27:12 [name], [name] ([id number]) --------------------------------------------------------------------------------  Problem List Details Patient Name: [name], [name]. Date of Service: 11/22/2015 1:30 PM Medical Record Patient Account Number:  [id number] Number: Treating RN: Baruch Gouty, RN, BSN, Rita [birth year] [phone] y.o. Other Clinician: Date of Birth/Sex: Male) Treating ROBSON, MICHAEL Primary Care Physician/Extender: Rose Fillers, JEFFREY Physician: Referring Physician: Buddy Duty, JEFFREY Weeks in Treatment: 4 Active Problems ICD-10 Encounter Code Description Active Date Diagnosis L97.521 Non-pressure chronic ulcer of other part of left foot limited 10/25/2015 Yes to breakdown of skin E11.621 Type 2 diabetes mellitus with foot ulcer 10/25/2015 Yes E11.42 Type 2 diabetes mellitus with diabetic polyneuropathy 10/25/2015 Yes L03.116 Cellulitis of left lower limb 11/01/2015 Yes Inactive Problems Resolved Problems Electronic Signature(s) Signed: 11/22/2015 4:27:33 PM By: [name] MD Entered By: [name] on 11/22/2015 14:14:34 [name] ([id number]) -------------------------------------------------------------------------------- Progress Note Details Patient Name: [name]. Date of Service: 11/22/2015 1:30 PM Medical Record Patient Account Number: [id number] Number: Treating RN: Baruch Gouty, RN, BSN, Rita May 24, 1961 [phone] y.o. Other Clinician: Date of Birth/Sex: Male) Treating ROBSON, MICHAEL Primary Care Physician/Extender: Rose Fillers, JEFFREY Physician: Referring Physician: Buddy Duty, JEFFREY Weeks in Treatment: 4 Subjective Chief Complaint Information obtained from Patient Chronic left calf traumatic ulcer (healed). Left third toe ulcer (healed). New right calf ulcer. 10/25/15; patient returns today with a wound over his left lateral foot History of Present Illness (HPI) Pleasant 55 year old with history of diabetes (Hgb A1c 10.8 in 2014) and peripheral neuropathy. No PVD. L ABI 1.1. Status post right great toe partial amputation years ago. He was at work and on 10/22/2014, was injured by a cart, and suffered an ulceration to his left anterior calf. He says that it subsequently became infected, and he  was treated with a course of antibiotics. He was found on initial exam to have an ulceration on the dorsum of his left third toe. He was unaware of this and attributes it to pressure from his steel toed boots. More recently he injured his right anterior calf on a cart. Ambulating normally per his baseline. He has been undergoing regular debridements, applying mupirocin cream, and an Ace wrap for edema control. He returns to clinic for follow-up and is without complaints. No pain. No fever or chills. No drainage. 10/25/15; this is a 55 year old man who has type II diabetes with diabetic polyneuropathy. He tells me that he fractured his left fifth metatarsal in June 2016 when  he presented with swelling. He does not recall a specific injury. His hemoglobin A1c was apparently too high at the time for consideration of surgery and he was put in some form of offloading. Ultimately he went to surgery in December with an allograft from his calcaneus to this site, plate and screws. He had an x-ray of the foot in March that showed concerns about nonunion. He tells me that in March he had to move and basically moved himself. He was on his foot a lot and then noticed some drainage from an open area. He has been following with his orthopedic surgeon Dr. Doran Durand. He has been applying a felt donut, dry dressing and using his heel healing sandal. 11/01/15; this is a patient I saw last week for the first time. He had a small open wound on the plantar aspect of his left foot at roughly the level of the base of his fifth metatarsal. He had a considerable degree of thickened skin around this wound on the plantar aspect which I thought was from chronic pressure on this area. He tells [place] that he had drainage over the course of the week. No systemic symptoms. 11/08/15; culture last week grew Citrobacter korseri. This should've been sensitive to the Augmentin I gave him. He has seen Dr. Doran Durand who did his initial surgery and  according the patient the plan is to give this another month and then the hardware might need to come out of this. This seems like a reasonable plan. I will adjust his antibiotics to ciprofloxacin which probably should continue for at least another 2 weeks. I gave him 10 days worth today [name], [name] ([id number]) 11/22/15 the patient has completed antibiotics. He has an appointment with Dr. Doran Durand this Friday. There is improved dimensions around the wound on the left fifth metatarsal base Objective Constitutional Sitting or standing Blood Pressure is within target range for patient.. Pulse regular and within target range for patient.Marland Kitchen Respirations regular, non-labored and within target range.. Temperature is normal and within the target range for the patient.. Patient's appearance is neat and clean. Appears in no acute distress. Well nourished and well developed.. Vitals Time Taken: 1:47 PM, Height: 70 in, Weight: 257 lbs, BMI: 36.9, Temperature: 98.4 F, Pulse: 101 bpm, Respiratory Rate: 17 breaths/min, Blood Pressure: 138/89 mmHg. Eyes Conjunctivae clear. No discharge. No scleral icterus. Cardiovascular Pedal pulses palpable and strong bilaterally.. Lymphatic None palpable in the popliteal or inguinal area. Psychiatric No evidence of depression, anxiety, or agitation. Calm, cooperative, and communicative. Appropriate interactions and affect.. General Notes: Wound appears to of filled in. I cannot see any exposed hardware. No evidence of surrounding infection Integumentary (Hair, Skin) The wound is at the base of the left fifth metatarsal. Dimensions are now nicely surface of the wound appears better and the wound appears to of filled than [I can no longer see exposed hardware] he still has thick surrounding skin and rolled senescent edges around the wound which may ultimately need debridement. Wound #4 status is Open. Original cause of wound was Pressure Injury. The wound is  located on the [place]. The wound measures 3.3cm length x 1.2cm width x 0.1cm depth; 3.11cm^2 area and 0.311cm^3 volume. The wound is limited to skin breakdown. There is no tunneling or undermining noted. There is a large amount of serosanguineous drainage noted. The wound margin is distinct with the outline attached to the wound base. There is medium (34-66%) pink granulation within the wound bed. There is a medium (34-66%)  amount of necrotic tissue within the wound bed including Adherent Slough. The periwound skin appearance exhibited: Callus, Moist. The periwound skin appearance did not exhibit: IOANIS, CARTER. ([id number]) Crepitus, Excoriation, Fluctuance, Friable, Induration, Localized Edema, Rash, Scarring, Dry/Scaly, Maceration, Atrophie Blanche, Cyanosis, Ecchymosis, Hemosiderin Staining, Mottled, Pallor, Rubor, Erythema. Periwound temperature was noted as No Abnormality. The periwound has tenderness on palpation. Assessment Active Problems ICD-10 L97.521 - Non-pressure chronic ulcer of other part of left foot limited to breakdown of skin E11.621 - Type 2 diabetes mellitus with foot ulcer E11.42 - Type 2 diabetes mellitus with diabetic polyneuropathy L03.116 - Cellulitis of left lower limb Plan Wound Cleansing: Wound #4 Left,Lateral,Plantar Foot: Cleanse wound with mild soap and water May Shower, gently pat wound dry prior to applying new dressing. May shower with protection. Anesthetic: Wound #4 Left,Lateral,Plantar Foot: Topical Lidocaine 4% cream applied to wound bed prior to debridement Primary Wound Dressing: Wound #4 Left,Lateral,Plantar Foot: Aquacel Ag Secondary Dressing: Wound #4 Left,Lateral,Plantar Foot: Gauze and Kerlix/Conform Dressing Change Frequency: Wound #4 Left,Lateral,Plantar Foot: Change dressing every day. Follow-up Appointments: Wound #4 Left,Lateral,Plantar Foot: Return Appointment in 1 week. Off-Loading: Wound #4  Left,Lateral,Plantar Foot: Other: - felt Additional Orders / Instructions: Wound #4 Left,Lateral,Plantar Foot: CALOGERO, BOOSE. ([id number]) Increase protein intake. Activity as tolerated Medications-please add to medication list.: Wound #4 Left,Lateral,Plantar Foot: P.O. Antibiotics - Doxy 100mg  po bid x 7 days Cipro 500mg  po every 12 hrs x 7 days Other: - Hydrocodone 5/325 q6hrs PRN #1 we will continue the Aquacel Ag. #2 at some point may need removal of the rolled senescent edges around the wound circumference #3 he has completed his antibiotics, I didn't think any further antibiotics were necessary #4 patient is to see his orthopedic surgeon on Friday [Dr. Hewitt] Electronic Signature(s) Signed: 11/22/2015 4:27:33 PM By: [name] MD Entered By: [name] on 11/22/2015 14:19:03 [name] ([id number]) -------------------------------------------------------------------------------- [place] Details Patient Name: [name]. Date of Service: 11/22/2015 Medical Record Patient Account Number: [id number] Number: Treating RN: Baruch Gouty, RN, BSN, Rita 1961/06/24 [phone] y.o. Other Clinician: Date of Birth/Sex: Male) Treating ROBSON, MICHAEL Primary Care Physician: Buddy Duty, JEFFREY Physician/Extender: G Referring Physician: Buddy Duty, JEFFREY Weeks in Treatment: 4 Diagnosis Coding ICD-10 Codes Code Description L97.521 Non-pressure chronic ulcer of other part of left foot limited to breakdown of skin E11.621 Type 2 diabetes mellitus with foot ulcer E11.42 Type 2 diabetes mellitus with diabetic polyneuropathy L03.116 Cellulitis of left lower limb Physician Procedures CPT4 Code: [id number] Description: [id number] - WC PHYS LEVEL 3 - EST PT ICD-10 Description Diagnosis E11.621 Type 2 diabetes mellitus with foot ulcer Modifier: Quantity: 1 Electronic Signature(s) Signed: 11/22/2015 4:27:33 PM By: [name] MD Entered By: [name] on 11/22/2015 14:19:48

## 2015-11-29 ENCOUNTER — Encounter: Payer: Managed Care, Other (non HMO) | Admitting: Internal Medicine

## 2015-11-29 DIAGNOSIS — E11621 Type 2 diabetes mellitus with foot ulcer: Secondary | ICD-10-CM | POA: Diagnosis not present

## 2015-11-30 NOTE — Progress Notes (Signed)
[name], [name] ([id number]) Visit Report for 11/29/2015 Chief Complaint Document Details Patient Name: [name], [name]. Date of Service: 11/29/2015 1:30 PM Medical Record Patient Account Number: [id number] Number: Treating RN: Baruch Gouty, RN, BSN, Rita Jul 27, 1960 [phone] y.o. Other Clinician: Date of Birth/Sex: Male) Treating Clarnce Homan Primary Care Physician/Extender: Rose Fillers, JEFFREY Physician: Referring Physician: Buddy Duty, JEFFREY Weeks in Treatment: 5 Information Obtained from: Patient Chief Complaint Chronic left calf traumatic ulcer (healed). Left third toe ulcer (healed). New right calf ulcer. 10/25/15; patient returns today with a wound over his left lateral foot Electronic Signature(s) Signed: 11/29/2015 5:25:36 PM By: [name] MD Entered By: [name] on 11/29/2015 14:08:56 [name] ([id number]) -------------------------------------------------------------------------------- Debridement Details Patient Name: [name]. Date of Service: 11/29/2015 1:30 PM Medical Record Patient Account Number: [id number] Number: Treating RN: Baruch Gouty, RN, BSN, Rita [birth year] [phone] y.o. Other Clinician: Date of Birth/Sex: Male) Treating Cheryl Chay Primary Care Physician/Extender: Rose Fillers, JEFFREY Physician: Referring Physician: Buddy Duty, JEFFREY Weeks in Treatment: 5 Debridement Performed for Wound #4 Left,Lateral,Plantar Foot Assessment: Performed By: Physician Ricard Dillon, MD Debridement: Debridement Pre-procedure Yes Verification/Time Out Taken: Start Time: 13:58 Pain Control: Lidocaine 4% Topical Solution Level: Skin/Subcutaneous Tissue Total Area Debrided (L x 2.8 (cm) x 1 (cm) = 2.8 (cm) W): Tissue and other Non-Viable, Callus, Fibrin/Slough, Subcutaneous material debrided: Instrument: Curette Bleeding: Minimum Hemostasis Achieved: Pressure End Time: 14:02 Procedural Pain: 0 Post Procedural Pain: 0 Response to Treatment:  Procedure was tolerated well Post Debridement Measurements of Total Wound Length: (cm) 2.8 Width: (cm) 1 Depth: (cm) 0.1 Volume: (cm) 0.22 Post Procedure Diagnosis Same as Pre-procedure Electronic Signature(s) Signed: 11/29/2015 5:20:16 PM By: Regan Lemming BSN, RN Signed: 11/29/2015 5:25:36 PM By: [name] MD Entered By: [name] on 11/29/2015 14:08:28 [name] ([name] ([id number]) -------------------------------------------------------------------------------- HPI Details Patient Name: [name], [name]. Date of Service: 11/29/2015 1:30 PM Medical Record Patient Account Number: [id number] Number: Treating RN: Baruch Gouty, RN, BSN, Rita [birth year] [phone] y.o. Other Clinician: Date of Birth/Sex: Male) Treating Najat Olazabal Primary Care Physician/Extender: Rose Fillers, JEFFREY Physician: Referring Physician: Buddy Duty, JEFFREY Weeks in Treatment: 5 History of Present Illness HPI Description: Pleasant 55 year old with history of diabetes (Hgb A1c 10.8 in 2014) and peripheral neuropathy. No PVD. L ABI 1.1. Status post right great toe partial amputation years ago. He was at work and on 10/22/2014, was injured by a cart, and suffered an ulceration to his left anterior calf. He says that it subsequently became infected, and he was treated with a course of antibiotics. He was found on initial exam to have an ulceration on the dorsum of his left third toe. He was unaware of this and attributes it to pressure from his steel toed boots. More recently he injured his right anterior calf on a cart. Ambulating normally per his baseline. He has been undergoing regular debridements, applying mupirocin cream, and an Ace wrap for edema control. He returns to clinic for follow-up and is without complaints. No pain. No fever or chills. No drainage. 10/25/15; this is a 55 year old man who has type II diabetes with diabetic polyneuropathy. He tells me that  he fractured his left fifth metatarsal in June 2016 when he presented with swelling. He does not recall a specific injury. His hemoglobin A1c was apparently too high at the time for consideration of surgery and he was put in some form of offloading. Ultimately he went to surgery in December with an allograft from his calcaneus to this  site, plate and screws. He had an x-ray of the foot in March that showed concerns about nonunion. He tells me that in March he had to move and basically moved himself. He was on his foot a lot and then noticed some drainage from an open area. He has been following with his orthopedic surgeon Dr. Doran Durand. He has been applying a felt donut, dry dressing and using his heel healing sandal. 11/01/15; this is a patient I saw last week for the first time. He had a small open wound on the plantar aspect of his left foot at roughly the level of the base of his fifth metatarsal. He had a considerable degree of thickened skin around this wound on the plantar aspect which I thought was from chronic pressure on this area. He tells [place] that he had drainage over the course of the week. No systemic symptoms. 11/08/15; culture last week grew Citrobacter korseri. This should've been sensitive to the Augmentin I gave him. He has seen Dr. Doran Durand who did his initial surgery and according the patient the plan is to give this another month and then the hardware might need to come out of this. This seems like a reasonable plan. I will adjust his antibiotics to ciprofloxacin which probably should continue for at least another 2 weeks. I gave him 10 days worth today 11/22/15 the patient has completed antibiotics. He has an appointment with Dr. Doran Durand this Friday. There is improved dimensions around the wound on the left fifth metatarsal base 11/29/15; the patient has completed antibiotics last week. Apparently his appointment with Dr. Jeani Sow it is not until this Friday. Dimensions are roughly the  same. Electronic Signature(s) [name], [name] ([id number]) Signed: 11/29/2015 5:25:36 PM By: [name] MD Entered By: [name] on 11/29/2015 14:10:01 [name], [name] ([id number]) -------------------------------------------------------------------------------- Physical Exam Details Patient Name: [name], [name]. Date of Service: 11/29/2015 1:30 PM Medical Record Patient Account Number: [id number] Number: Treating RN: Baruch Gouty, RN, BSN, Rita Nov 15, 1960 [phone] y.o. Other Clinician: Date of Birth/Sex: Male) Treating Dustan Hyams Primary Care Physician/Extender: Rose Fillers, JEFFREY Physician: Referring Physician: Buddy Duty, JEFFREY Weeks in Treatment: 5 Notes Wound exam; he continued improvement in the surface of the wound is gratifying, there is no exposed hardware and no evidence of surrounding infection. Surgical debridement to remove skin nonviable subcutaneous tissue from around the circumference of the wound Electronic Signature(s) Signed: 11/29/2015 5:25:36 PM By: [name] MD Entered By: [name] on 11/29/2015 14:11:29 [name] ([id number]) -------------------------------------------------------------------------------- Physician Orders Details Patient Name: [name]. Date of Service: 11/29/2015 1:30 PM Medical Record Patient Account Number: [id number] Number: Treating RN: Baruch Gouty, RN, BSN, Rita 31-Mar-1961 [phone] y.o. Other Clinician: Date of Birth/Sex: Male) Treating Khanh Tanori Primary Care Physician/Extender: Rose Fillers, JEFFREY Physician: Referring Physician: Mackey Birchwood in Treatment: 5 Verbal / Phone Orders: Yes Clinician: Afful, RN, BSN, Rita Read Back and Verified: Yes Diagnosis Coding Wound Cleansing Wound #4 Left,Lateral,Plantar Foot o Cleanse wound with mild soap and water o May Shower, gently pat wound dry prior to applying new dressing. o May shower with protection. Anesthetic Wound #4  Left,Lateral,Plantar Foot o Topical Lidocaine 4% cream applied to wound bed prior to debridement Primary Wound Dressing Wound #4 Left,Lateral,Plantar Foot o Aquacel Ag Secondary Dressing Wound #4 Left,Lateral,Plantar Foot o Gauze and Kerlix/Conform Dressing Change Frequency Wound #4 Left,Lateral,Plantar Foot o Change dressing every day. Follow-up Appointments Wound #4 Left,Lateral,Plantar Foot o Return Appointment in 1 week. Off-Loading Wound #4 Left,Lateral,Plantar Foot o  Other: - felt Additional Orders / Instructions Wound #4 Left,Lateral,Plantar Foot [name], [name]. ([id number]) o Increase protein intake. o Activity as tolerated Electronic Signature(s) Signed: 11/29/2015 5:20:16 PM By: Regan Lemming BSN, RN Signed: 11/29/2015 5:25:36 PM By: [name] MD Entered By: Regan Lemming on 11/29/2015 14:03:23 [name] ([id number]) -------------------------------------------------------------------------------- Problem List Details Patient Name: [name], [name]. Date of Service: 11/29/2015 1:30 PM Medical Record Patient Account Number: [id number] Number: Treating RN: Baruch Gouty, RN, BSN, Rita 06-09-1961 [phone] y.o. Other Clinician: Date of Birth/Sex: Male) Treating Derian Dimalanta Primary Care Physician/Extender: Rose Fillers, JEFFREY Physician: Referring Physician: Buddy Duty, JEFFREY Weeks in Treatment: 5 Active Problems ICD-10 Encounter Code Description Active Date Diagnosis L97.521 Non-pressure chronic ulcer of other part of left foot limited 10/25/2015 Yes to breakdown of skin E11.621 Type 2 diabetes mellitus with foot ulcer 10/25/2015 Yes E11.42 Type 2 diabetes mellitus with diabetic polyneuropathy 10/25/2015 Yes L03.116 Cellulitis of left lower limb 11/01/2015 Yes Inactive Problems Resolved Problems Electronic Signature(s) Signed: 11/29/2015 5:25:36 PM By: [name] MD Entered By: [name] on 11/29/2015 14:08:04 [name]  ([id number]) -------------------------------------------------------------------------------- Progress Note Details Patient Name: [name]. Date of Service: 11/29/2015 1:30 PM Medical Record Patient Account Number: [id number] Number: Treating RN: Baruch Gouty, RN, BSN, Rita 06-30-1961 [phone] y.o. Other Clinician: Date of Birth/Sex: Male) Treating Wanda Cellucci Primary Care Physician/Extender: Rose Fillers, JEFFREY Physician: Referring Physician: Buddy Duty, JEFFREY Weeks in Treatment: 5 Subjective Chief Complaint Information obtained from Patient Chronic left calf traumatic ulcer (healed). Left third toe ulcer (healed). New right calf ulcer. 10/25/15; patient returns today with a wound over his left lateral foot History of Present Illness (HPI) Pleasant 55 year old with history of diabetes (Hgb A1c 10.8 in 2014) and peripheral neuropathy. No PVD. L ABI 1.1. Status post right great toe partial amputation years ago. He was at work and on 10/22/2014, was injured by a cart, and suffered an ulceration to his left anterior calf. He says that it subsequently became infected, and he was treated with a course of antibiotics. He was found on initial exam to have an ulceration on the dorsum of his left third toe. He was unaware of this and attributes it to pressure from his steel toed boots. More recently he injured his right anterior calf on a cart. Ambulating normally per his baseline. He has been undergoing regular debridements, applying mupirocin cream, and an Ace wrap for edema control. He returns to clinic for follow-up and is without complaints. No pain. No fever or chills. No drainage. 10/25/15; this is a 55 year old man who has type II diabetes with diabetic polyneuropathy. He tells me that he fractured his left fifth metatarsal in June 2016 when he presented with swelling. He does not recall a specific injury. His hemoglobin A1c was apparently too high at the time for consideration of  surgery and he was put in some form of offloading. Ultimately he went to surgery in December with an allograft from his calcaneus to this site, plate and screws. He had an x-ray of the foot in March that showed concerns about nonunion. He tells me that in March he had to move and basically moved himself. He was on his foot a lot and then noticed some drainage from an open area. He has been following with his orthopedic surgeon Dr. Doran Durand. He has been applying a felt donut, dry dressing and using his heel healing sandal. 11/01/15; this is a patient I saw last week for the first time. He had a small  open wound on the plantar aspect of his left foot at roughly the level of the base of his fifth metatarsal. He had a considerable degree of thickened skin around this wound on the plantar aspect which I thought was from chronic pressure on this area. He tells [place] that he had drainage over the course of the week. No systemic symptoms. 11/08/15; culture last week grew Citrobacter korseri. This should've been sensitive to the Augmentin I gave him. He has seen Dr. Doran Durand who did his initial surgery and according the patient the plan is to give this another month and then the hardware might need to come out of this. This seems like a reasonable plan. I will adjust his antibiotics to ciprofloxacin which probably should continue for at least another 2 weeks. I gave him 10 days worth today [name], [name] ([id number]) 11/22/15 the patient has completed antibiotics. He has an appointment with Dr. Doran Durand this Friday. There is improved dimensions around the wound on the left fifth metatarsal base 11/29/15; the patient has completed antibiotics last week. Apparently his appointment with Dr. Jeani Sow it is not until this Friday. Dimensions are roughly the same. Objective Constitutional Vitals Time Taken: 1:35 PM, Height: 70 in, Weight: 257 lbs, BMI: 36.9, Temperature: 98.2 F, Pulse: 106 bpm, Respiratory Rate: 16  breaths/min, Blood Pressure: 151/70 mmHg. Integumentary (Hair, Skin) Wound #4 status is Open. Original cause of wound was Pressure Injury. The wound is located on the [place]. The wound measures 2.8cm length x 1cm width x 0.1cm depth; 2.199cm^2 area and 0.22cm^3 volume. The wound is limited to skin breakdown. There is no tunneling or undermining noted. There is a large amount of serosanguineous drainage noted. The wound margin is distinct with the outline attached to the wound base. There is medium (34-66%) pink granulation within the wound bed. There is a small (1-33%) amount of necrotic tissue within the wound bed including Adherent Slough. The periwound skin appearance exhibited: Callus, Moist. The periwound skin appearance did not exhibit: Crepitus, Excoriation, Fluctuance, Friable, Induration, Localized Edema, Rash, Scarring, Dry/Scaly, Maceration, Atrophie Blanche, Cyanosis, Ecchymosis, Hemosiderin Staining, Mottled, Pallor, Rubor, Erythema. Periwound temperature was noted as No Abnormality. The periwound has tenderness on palpation. Assessment Active Problems ICD-10 L97.521 - Non-pressure chronic ulcer of other part of left foot limited to breakdown of skin E11.621 - Type 2 diabetes mellitus with foot ulcer E11.42 - Type 2 diabetes mellitus with diabetic polyneuropathy L03.116 - Cellulitis of left lower limb Procedures [name], [name]. ([id number]) Wound #4 Wound #4 is a Diabetic Wound/Ulcer of the Lower Extremity located on the Left,Lateral,Plantar Foot . There was a Skin/Subcutaneous Tissue Debridement [id number]) debridement with total area of 2.8 sq cm performed by Ricard Dillon, MD. with the following instrument(s): Curette to remove Non-Viable tissue/material including Fibrin/Slough, Callus, and Subcutaneous after achieving pain control using Lidocaine 4% Topical Solution. A time out was conducted prior to the start of the procedure. A Minimum amount of  bleeding was controlled with Pressure. The procedure was tolerated well with a pain level of 0 throughout and a pain level of 0 following the procedure. Post Debridement Measurements: 2.8cm length x 1cm width x 0.1cm depth; 0.22cm^3 volume. Post procedure Diagnosis Wound #4: Same as Pre-Procedure Plan Wound Cleansing: Wound #4 Left,Lateral,Plantar Foot: Cleanse wound with mild soap and water May Shower, gently pat wound dry prior to applying new dressing. May shower with protection. Anesthetic: Wound #4 Left,Lateral,Plantar Foot: Topical Lidocaine 4% cream applied to wound bed prior to debridement Primary  Wound Dressing: Wound #4 Left,Lateral,Plantar Foot: Aquacel Ag Secondary Dressing: Wound #4 Left,Lateral,Plantar Foot: Gauze and Kerlix/Conform Dressing Change Frequency: Wound #4 Left,Lateral,Plantar Foot: Change dressing every day. Follow-up Appointments: Wound #4 Left,Lateral,Plantar Foot: Return Appointment in 1 week. Off-Loading: Wound #4 Left,Lateral,Plantar Foot: Other: - felt Additional Orders / Instructions: Wound #4 Left,Lateral,Plantar Foot: Increase protein intake. Activity as tolerated [name], [name]. ([id number]) #1no change to Aquacel AG. Consider chang next week if the dimensions are not smaller. Electronic Signature(s) Signed: 11/29/2015 5:25:36 PM By: [name] MD Entered By: [name] on 11/29/2015 14:13:07 [name] ([id number]) -------------------------------------------------------------------------------- [place] Details Patient Name: [name], [name]. Date of Service: 11/29/2015 Medical Record Patient Account Number: [id number] Number: Treating RN: Baruch Gouty, RN, BSN, Rita Aug 17, 1960 [phone] y.o. Other Clinician: Date of Birth/Sex: Male) Treating Corderius Saraceni Primary Care Physician: Buddy Duty, JEFFREY Physician/Extender: G Referring Physician: Buddy Duty, JEFFREY Weeks in Treatment: 5 Diagnosis Coding ICD-10 Codes Code  Description L97.521 Non-pressure chronic ulcer of other part of left foot limited to breakdown of skin E11.621 Type 2 diabetes mellitus with foot ulcer E11.42 Type 2 diabetes mellitus with diabetic polyneuropathy L03.116 Cellulitis of left lower limb Facility Procedures CPT4 Code: [id number] Description: [id number] - DEB SUBQ TISSUE 20 SQ CM/< ICD-10 Description Diagnosis E11.621 Type 2 diabetes mellitus with foot ulcer Modifier: Quantity: 1 Physician Procedures CPT4 Code: [id number] Description: [id number] - WC PHYS SUBQ TISS 20 SQ CM ICD-10 Description Diagnosis E11.621 Type 2 diabetes mellitus with foot ulcer Modifier: Quantity: 1 Electronic Signature(s) Signed: 11/29/2015 5:25:36 PM By: [name] MD Entered By: [name] on 11/29/2015 14:13:47

## 2015-11-30 NOTE — Progress Notes (Signed)
GOPAL, MELODY ([id number]) Visit Report for 11/29/2015 Arrival Information Details Patient Name: [name], [name]. Date of Service: 11/29/2015 1:30 PM Medical Record Patient Account Number: [id number] Number: Treating RN: Baruch Gouty, RN, BSN, Rita February 16, 1961 [phone] y.o. Other Clinician: Date of Birth/Sex: Male) Treating ROBSON, [place] Primary Care Physician: Buddy Duty, JEFFREY Physician/Extender: G Referring Physician: Buddy Duty, JEFFREY Weeks in Treatment: 5 Visit Information History Since Last Visit Added or deleted any medications: No Patient Arrived: Ambulatory Any new allergies or adverse reactions: No Arrival Time: 13:33 Had a fall or experienced change in No Accompanied By: self activities of daily living that may affect Transfer Assistance: None risk of falls: Patient Identification Verified: Yes Signs or symptoms of abuse/neglect since last No Secondary Verification Process Yes visito Completed: Hospitalized since last visit: No Patient Requires Transmission-Based No Has Dressing in Place as Prescribed: Yes Precautions: Has Footwear/Offloading in Place as Yes Patient Has Alerts: No Prescribed: Left: Wedge Shoe Pain Present Now: No Electronic Signature(s) Signed: 11/29/2015 5:20:16 PM By: Regan Lemming BSN, RN Entered By: Regan Lemming on 11/29/2015 13:35:44 [name] ([id number]) -------------------------------------------------------------------------------- Encounter Discharge Information Details Patient Name: [name]. Date of Service: 11/29/2015 1:30 PM Medical Record Patient Account Number: [id number] Number: Treating RN: Baruch Gouty, RN, BSN, Rita 23-Nov-1960 [phone] y.o. Other Clinician: Date of Birth/Sex: Male) Treating ROBSON, MICHAEL Primary Care Physician: Buddy Duty, JEFFREY Physician/Extender: G Referring Physician: Buddy Duty, JEFFREY Weeks in Treatment: 5 Encounter Discharge Information Items Discharge Pain Level: 0 Discharge Condition:  Stable Ambulatory Status: Ambulatory Discharge Destination: Home Transportation: Private Auto Accompanied By: SELf Schedule Follow-up Appointment: No Medication Reconciliation completed and provided to Patient/Care No [name]: Provided on Clinical Summary of Care: 11/29/2015 Form Type Recipient Paper Patient HP Electronic Signature(s) Signed: 11/29/2015 5:20:16 PM By: Regan Lemming BSN, RN Previous Signature: 11/29/2015 2:08:36 PM Version By: Ruthine Dose Entered By: Regan Lemming on 11/29/2015 14:12:56 [name] ([id number]) -------------------------------------------------------------------------------- Lower Extremity Assessment Details Patient Name: [name]. Date of Service: 11/29/2015 1:30 PM Medical Record Patient Account Number: [id number] Number: Treating RN: Baruch Gouty, RN, BSN, Rita July 13, 1960 [phone] y.o. Other Clinician: Date of Birth/Sex: Male) Treating ROBSON, [place] Primary Care Physician: Buddy Duty, JEFFREY Physician/Extender: G Referring Physician: Buddy Duty, JEFFREY Weeks in Treatment: 5 Edema Assessment Assessed: [Left: No] [Right: No] E[Left: dema] [Right: :] Calf Left: Right: Point of Measurement: 36 cm From Medial Instep cm cm Ankle Left: Right: Point of Measurement: 10 cm From Medial Instep cm cm Vascular Assessment Pulses: Posterior Tibial Dorsalis Pedis Palpable: [Left:Yes] Extremity colors, hair growth, and conditions: Extremity Color: [Left:Dusky] Hair Growth on Extremity: [Left:No] Temperature of Extremity: [Left:Warm] Capillary Refill: [Left:< 3 seconds] Toe Nail Assessment Left: Right: Thick: Yes Discolored: Yes Deformed: No Improper Length and Hygiene: No Electronic Signature(s) Signed: 11/29/2015 5:20:16 PM By: Regan Lemming BSN, RN Entered By: Regan Lemming on 11/29/2015 13:42:26 [name] ([id number] [name], [name] ([id number]) -------------------------------------------------------------------------------- Multi  Wound Chart Details Patient Name: [name]. Date of Service: 11/29/2015 1:30 PM Medical Record Patient Account Number: [id number] Number: Treating RN: Baruch Gouty, RN, BSN, Rita 12-Nov-1960 [phone] y.o. 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] Comorbid  History: [4:Hypertension, Type II Diabetes, Neuropathy] [N/A:N/A] Date Acquired: [4:07/12/2015] [N/A:N/A] Weeks of Treatment: [4:5] [N/A:N/A] Wound Status: [4:Open] [N/A:N/A] Measurements L x W x D 2.8x1x0.1 [N/A:N/A] (cm) Area (cm) : [4:2.199] [N/A:N/A] Volume (cm) : [4:0.22] [N/A:N/A] % Reduction in Area: [4:-180.10%] [N/A:N/A] % Reduction in Volume: 6.80% [N/A:N/A] Classification: [4:Grade 1] [N/A:N/A] Exudate Amount: [4:Large] [N/A:N/A] Exudate Type: [4:Serosanguineous] [N/A:N/A] Exudate Color: [4:red, brown] [N/A:N/A] Wound Margin: [4:Distinct, outline attached N/A] Granulation Amount: [4:Medium (34-66%)] [N/A:N/A] Granulation Quality: [4:Pink] [N/A:N/A] Necrotic Amount: [4:Small (1-33%)] [N/A:N/A] Exposed Structures: [4:Fascia: No Fat: No Tendon: No Muscle: No] [N/A:N/A] Joint: No Bone: No Limited to Skin Breakdown Epithelialization: Small (1-33%) N/A N/A Periwound Skin Texture: Callus: Yes N/A N/A Edema: No Excoriation: No Induration: No Crepitus: No Fluctuance: No Friable: No Rash: No Scarring: No Periwound Skin Moist: Yes N/A N/A Moisture: Maceration: No Dry/Scaly: No Periwound  Skin Color: Atrophie Blanche: No N/A N/A Cyanosis: No Ecchymosis: No Erythema: No Hemosiderin Staining: No Mottled: No Pallor: No Rubor: No Temperature: No Abnormality N/A N/A Tenderness on Yes N/A N/A Palpation: Wound Preparation: Ulcer Cleansing: N/A N/A Rinsed/Irrigated with Saline Topical Anesthetic Applied: Other: lidocaine 4% Treatment Notes Electronic Signature(s) Signed: 11/29/2015 5:20:16 PM By: Regan Lemming BSN, RN Entered By: Regan Lemming on 11/29/2015 13:44:45 [name] ([id number]) -------------------------------------------------------------------------------- [place] Details Patient Name: [name], [name]. Date of Service: 11/29/2015 1:30 PM Medical Record Patient Account Number: [id number] Number: Treating RN: Baruch Gouty, RN, BSN, Rita [birth year] [phone] y.o. Other Clinician: Date of Birth/Sex: Male) Treating ROBSON, [place] Primary Care Physician: Buddy Duty, JEFFREY Physician/Extender: G Referring Physician: Buddy Duty, JEFFREY Weeks in Treatment: 5 Active Inactive Orientation to the Wound Care Program Nursing Diagnoses: Knowledge deficit related to the wound healing center program Goals: Patient/caregiver will verbalize understanding of the [facility] Program Date Initiated: 10/25/2015 Goal Status: Active Interventions: Provide education on orientation to the wound center Notes: Peripheral Neuropathy Nursing Diagnoses: Knowledge deficit related to disease process and management of peripheral neurovascular dysfunction Potential alteration in peripheral tissue perfusion (select prior to confirmation of diagnosis) Goals: Patient/caregiver will verbalize understanding of disease process and disease management Date Initiated: 10/25/2015 Goal Status: Active Interventions: Assess signs and symptoms of neuropathy upon admission and as needed Provide education on Management of Neuropathy and Related Ulcers Provide education on  Management of Neuropathy upon discharge from the [facility] Activities: Test ordered outside of clinic : 10/25/2015 Notes: [name], [name] ([id number]) Wound/Skin Impairment Nursing Diagnoses: Impaired tissue integrity Knowledge deficit related to smoking impact on wound healing Knowledge deficit related to ulceration/compromised skin integrity Goals: Patient/caregiver will verbalize understanding of skin care regimen Date Initiated: 10/25/2015 Goal Status: Active Ulcer/skin breakdown will have a volume reduction of 30% by week 4 Date Initiated: 10/25/2015 Goal Status: Active Ulcer/skin breakdown will have a volume reduction of 50% by week 8 Date Initiated: 10/25/2015 Goal Status: Active Ulcer/skin breakdown will have a volume reduction of 80% by week 12 Date Initiated: 10/25/2015 Goal Status: Active Ulcer/skin breakdown will heal within 14 weeks Date Initiated: 10/25/2015 Goal Status: Active Interventions: Assess patient/caregiver ability to obtain necessary supplies Assess patient/caregiver ability to perform ulcer/skin care regimen upon admission and as needed Provide education on ulcer and skin care Treatment Activities: Skin care regimen initiated : 10/25/2015 Topical wound management initiated : 10/25/2015 Notes: Electronic Signature(s) Signed: 11/29/2015 5:20:16 PM By: Regan Lemming BSN, RN Entered By: Regan Lemming on 11/29/2015 13:44:37 [name] ([id number]) -------------------------------------------------------------------------------- Pain Assessment Details Patient Name: [name]. Date of Service: 11/29/2015 1:30  PM Medical Record Patient Account Number: [id number] Number: Treating RN: Afful, RN, BSN, Velva Harman Nov 24, 1960 [phone] y.o. Other Clinician: Date of Birth/Sex: Male) Treating ROBSON, [place] Primary Care Physician: Buddy Duty, JEFFREY Physician/Extender: G Referring Physician: Buddy Duty, JEFFREY Weeks in Treatment: 5 Active  Problems Location of Pain Severity and Description of Pain Patient Has Paino No Site Locations Pain Management and Medication Current Pain Management: Electronic Signature(s) Signed: 11/29/2015 5:20:16 PM By: Regan Lemming BSN, RN Entered By: Regan Lemming on 11/29/2015 13:35:51 [name] ([id number]) -------------------------------------------------------------------------------- Patient/Caregiver Education Details Patient Name: [name]. Date of Service: 11/29/2015 1:30 PM Medical Record Patient Account Number: [id number] Number: Treating RN: Baruch Gouty, RN, BSN, Rita 18-Jul-1960 [phone] y.o. Other Clinician: Date of Birth/Gender: Male) Treating ROBSON, [place] Primary Care Physician: Buddy Duty, JEFFREY Physician/Extender: G Referring Physician: Mackey [place] in Treatment: 5 Education Assessment Education Provided To: Patient Education Topics Provided Peripheral Neuropathy: Methods: Explain/Verbal Responses: State content correctly Welcome To [facility]: Methods: Explain/Verbal Responses: State content correctly Wound/Skin Impairment: Methods: Explain/Verbal Responses: State content correctly Electronic Signature(s) Signed: 11/29/2015 5:20:16 PM By: Regan Lemming BSN, RN Entered By: Regan Lemming on 11/29/2015 14:13:14 [name] ([id number]) -------------------------------------------------------------------------------- Wound Assessment Details Patient Name: [name]. Date of Service: 11/29/2015 1:30 PM Medical Record Patient Account Number: [id number] Number: Treating RN: Baruch Gouty, RN, BSN, Rita Apr 14, 1961 [phone] y.o. Other Clinician: Date of Birth/Sex: Male) Treating ROBSON, MICHAEL Primary Care Physician: Buddy Duty, JEFFREY Physician/Extender: G Referring Physician: Buddy Duty, JEFFREY Weeks in Treatment: 5 Wound Status Wound Number: 4 Primary Diabetic Wound/Ulcer of the Lower Etiology: Extremity Wound Location: Left Foot - Plantar,  Lateral Wound Status: Open Wounding Event: Pressure Injury Comorbid Hypertension, Type II Diabetes, Date Acquired: 07/12/2015 History: Neuropathy Weeks Of Treatment: 5 Clustered Wound: No Wound Measurements Length: (cm) 2.8 Width: (cm) 1 Depth: (cm) 0.1 Area: (cm) 2.199 Volume: (cm) 0.22 % Reduction in Area: -180.1% % Reduction in Volume: 6.8% Epithelialization: Small (1-33%) Tunneling: No Undermining: No Wound Description Classification: Grade 1 Wound Margin: Distinct, outline attached Exudate Amount: Large Exudate Type: Serosanguineous Exudate Color: red, brown Foul Odor After Cleansing: No Wound Bed Granulation Amount: Medium (34-66%) Exposed Structure Granulation Quality: Pink Fascia Exposed: No Necrotic Amount: Small (1-33%) Fat Layer Exposed: No Necrotic Quality: Adherent Slough Tendon Exposed: No Muscle Exposed: No Joint Exposed: No Bone Exposed: No Limited to Skin Breakdown Periwound Skin Texture Texture Color No Abnormalities Noted: No No Abnormalities Noted: No Callus: Yes Atrophie Blanche: No Crepitus: No Cyanosis: No [name], [name] ([id number]) Excoriation: No Ecchymosis: No Fluctuance: No Erythema: No Friable: No Hemosiderin Staining: No Induration: No Mottled: No Localized Edema: No Pallor: No Rash: No Rubor: No Scarring: No Temperature / Pain Moisture Temperature: No Abnormality No Abnormalities Noted: No Tenderness on Palpation: Yes Dry / Scaly: No Maceration: No Moist: Yes Wound Preparation Ulcer Cleansing: Rinsed/Irrigated with Saline Topical Anesthetic Applied: Other: lidocaine 4%, Treatment Notes Wound #4 (Left, Lateral, Plantar Foot) 1. Cleansed with: Clean wound with Normal Saline 4. Dressing Applied: Aquacel Ag 5. Secondary Dressing Applied Gauze and Kerlix/Conform 7. Secured with Recruitment consultant) Signed: 11/29/2015 5:20:16 PM By: Regan Lemming BSN, RN Entered By: Regan Lemming on 11/29/2015  13:43:56 [name] ([id number]) -------------------------------------------------------------------------------- Vitals Details Patient Name: [name]. Date of Service: 11/29/2015 1:30 PM Medical Record Patient Account Number: [id number] Number: Treating RN: Baruch Gouty, RN, BSN, Rita 01-20-1961 [phone] y.o. Other Clinician: Date of Birth/Sex: Male) Treating ROBSON, [place] Primary Care Physician: Buddy Duty, JEFFREY Physician/Extender: G Referring Physician: Buddy Duty  JEFFREY Weeks in Treatment: 5 Vital Signs Time Taken: 13:35 Temperature (F): 98.2 Height (in): 70 Pulse (bpm): 106 Weight (lbs): 257 Respiratory Rate (breaths/min): 16 Body Mass Index (BMI): 36.9 Blood Pressure (mmHg): 151/70 Reference Range: 80 - 120 mg / dl Electronic Signature(s) Signed: 11/29/2015 5:20:16 PM By: Regan Lemming BSN, RN Entered By: Regan Lemming on 11/29/2015 13:37:33

## 2015-12-06 ENCOUNTER — Encounter: Payer: Managed Care, Other (non HMO) | Attending: Internal Medicine | Admitting: Internal Medicine

## 2015-12-06 DIAGNOSIS — L97521 Non-pressure chronic ulcer of other part of left foot limited to breakdown of skin: Secondary | ICD-10-CM | POA: Insufficient documentation

## 2015-12-06 DIAGNOSIS — E1142 Type 2 diabetes mellitus with diabetic polyneuropathy: Secondary | ICD-10-CM | POA: Diagnosis not present

## 2015-12-06 DIAGNOSIS — L03116 Cellulitis of left lower limb: Secondary | ICD-10-CM | POA: Insufficient documentation

## 2015-12-06 DIAGNOSIS — Z794 Long term (current) use of insulin: Secondary | ICD-10-CM | POA: Diagnosis not present

## 2015-12-06 DIAGNOSIS — E11621 Type 2 diabetes mellitus with foot ulcer: Secondary | ICD-10-CM | POA: Diagnosis not present

## 2015-12-06 DIAGNOSIS — I10 Essential (primary) hypertension: Secondary | ICD-10-CM | POA: Diagnosis not present

## 2015-12-06 DIAGNOSIS — Z8249 Family history of ischemic heart disease and other diseases of the circulatory system: Secondary | ICD-10-CM | POA: Diagnosis not present

## 2015-12-07 NOTE — Progress Notes (Signed)
[name], [name] ([id number]) Visit Report for 12/06/2015 Arrival Information Details Patient Name: [name], [name]. Date of Service: 12/06/2015 2:15 PM Medical Record Patient Account Number: [id number] Number: Treating RN: Baruch Gouty, RN, BSN, Rita [birth year] [phone] y.o. Other Clinician: Date of Birth/Sex: Male) Treating ROBSON, MICHAEL Primary Care Physician: Buddy Duty, JEFFREY Physician/Extender: G Referring Physician: Buddy Duty, JEFFREY Weeks in Treatment: 6 Visit Information History Since Last Visit Added or deleted any medications: No Patient Arrived: Ambulatory Any new allergies or adverse reactions: No Arrival Time: 14:26 Had a fall or experienced change in No Accompanied By: self activities of daily living that may affect Transfer Assistance: None risk of falls: Patient Identification Verified: Yes Signs or symptoms of abuse/neglect since last No Secondary Verification Process Yes visito Completed: Hospitalized since last visit: No Patient Requires Transmission-Based No Has Dressing in Place as Prescribed: Yes Precautions: Has Footwear/Offloading in Place as Yes Patient Has Alerts: No Prescribed: Left: Wedge Shoe Pain Present Now: No Electronic Signature(s) Signed: 12/06/2015 4:14:26 PM By: Regan Lemming BSN, RN Entered By: Regan Lemming on 12/06/2015 14:28:36 [name] ([id number]) -------------------------------------------------------------------------------- Encounter Discharge Information Details Patient Name: [name], [name]. Date of Service: 12/06/2015 2:15 PM Medical Record Patient Account Number: [id number] Number: Treating RN: Baruch Gouty, RN, BSN, Rita [name] [phone] y.o. Other Clinician: Date of Birth/Sex: Male) Treating ROBSON, MICHAEL Primary Care Physician: Buddy Duty, JEFFREY Physician/Extender: G Referring Physician: Delrae Rend Weeks in Treatment: 6 Encounter Discharge Information Items Schedule Follow-up Appointment: No Medication  Reconciliation completed No and provided to Patient/Care Grason [place]: Provided on Clinical Summary of Care: 12/06/2015 Form Type Recipient Paper Patient HP Electronic Signature(s) Signed: 12/06/2015 2:54:14 PM By: Ruthine Dose Entered By: Ruthine Dose on 12/06/2015 14:54:14 [name] ([id number]) -------------------------------------------------------------------------------- Lower Extremity Assessment Details Patient Name: [name]. Date of Service: 12/06/2015 2:15 PM Medical Record Patient Account Number: [id number] Number: Treating RN: Baruch Gouty, RN, BSN, Rita [birth year] [phone] y.o. Other Clinician: Date of Birth/Sex: Male) Treating ROBSON, [place] Primary Care Physician: Buddy Duty, JEFFREY Physician/Extender: G Referring Physician: Buddy Duty, JEFFREY Weeks in Treatment: 6 Edema Assessment Assessed: [Left: No] [Right: No] Edema: [Left: Ye] [Right: s] Calf Left: Right: Point of Measurement: 36 cm From Medial Instep cm cm Ankle Left: Right: Point of Measurement: 10 cm From Medial Instep cm cm Vascular Assessment Pulses: Posterior Tibial Dorsalis Pedis Palpable: [Left:Yes] Extremity colors, hair growth, and conditions: Extremity Color: [Left:Dusky] Hair Growth on Extremity: [Left:No] Temperature of Extremity: [Left:Warm] Capillary Refill: [Left:< 3 seconds] Toe Nail Assessment Left: Right: Thick: Yes Discolored: Yes Deformed: No Improper Length and Hygiene: No Electronic Signature(s) Signed: 12/06/2015 4:14:26 PM By: Regan Lemming BSN, RN Entered By: Regan Lemming on 12/06/2015 14:46:41 [name] ([id number] Brood, [name] ([id number]) -------------------------------------------------------------------------------- Multi Wound Chart Details Patient Name: [name]. Date of Service: 12/06/2015 2:15 PM Medical Record Patient Account Number: [id number] Number: Treating RN: Baruch Gouty, RN, BSN, Rita [birth year], [name] [phone] y.o. 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] Comorbid History: [4:Hypertension, Type II Diabetes, Neuropathy] [N/A:N/A] Date Acquired: [4:1/[name]/2017] [N/A:N/A] Weeks of Treatment: [4:6] [N/A:N/A] Wound Status: [4:Open] [N/A:N/A] Measurements L x W x D 1.8x1.3x0.1 [N/A:N/A] (cm)  Area (cm) : [4:1.838] [N/A:N/A] Volume (cm) : [4:0.184] [N/A:N/A] % Reduction in Area: [4:-134.10%] [N/A:N/A] % Reduction in Volume: 22.00% [N/A:N/A] Classification: [4:Grade 1] [N/A:N/A] Exudate Amount: [4:Large] [N/A:N/A] Exudate Type: [4:Serosanguineous] [N/A:N/A] Exudate Color: [4:red, brown] [N/A:N/A] Wound Margin: [4:Distinct, outline attached N/A] Granulation Amount: [4:Medium (34-66%)] [N/A:N/A] Granulation Quality: [4:Pink] [N/A:N/A] Necrotic Amount: [4:Small (1-33%)] [N/A:N/A] Exposed Structures: [4:Fascia: No Fat: No Tendon: No Muscle: No] [N/A:N/A] Joint: No Bone: No Limited to Skin Breakdown Epithelialization: None N/A N/A Periwound Skin Texture: Callus: Yes N/A N/A Edema: No Excoriation: No Induration: No Crepitus: No Fluctuance: No Friable: No Rash: No Scarring: No Periwound Skin Moist: Yes N/A N/A Moisture: Maceration: No Dry/Scaly: No Periwound Skin Color: Atrophie Blanche: No N/A N/A Cyanosis: No Ecchymosis: No Erythema: No Hemosiderin Staining: No Mottled: No Pallor: No Rubor: No Temperature: No Abnormality N/A N/A Tenderness on Yes N/A N/A Palpation: Wound  Preparation: Ulcer Cleansing: N/A N/A Rinsed/Irrigated with Saline Topical Anesthetic Applied: Other: lidocaine 4% Treatment Notes Electronic Signature(s) Signed: 12/06/2015 4:14:26 PM By: Regan Lemming BSN, RN Entered By: Regan Lemming on 12/06/2015 14:36:03 [name] ([id number]) -------------------------------------------------------------------------------- [place] Details Patient Name: [name], [name]. Date of Service: 12/06/2015 2:15 PM Medical Record Patient Account Number: [id number] Number: Treating RN: Baruch Gouty, RN, BSN, Rita [birth year][name] [phone] y.o. Other Clinician: Date of Birth/Sex: Male) Treating ROBSON, [place] Primary Care Physician: Buddy Duty, JEFFREY Physician/Extender: G Referring Physician: Buddy Duty, JEFFREY Weeks in Treatment: 6 Active Inactive Orientation to the Wound Care Program Nursing Diagnoses: Knowledge deficit related to the wound healing center program Goals: Patient/caregiver will verbalize understanding of the [place] Program Date Initiated: 10/25/2015 Goal Status: Active Interventions: Provide education on orientation to the wound center Notes: Peripheral Neuropathy Nursing Diagnoses: Knowledge deficit related to disease process and management of peripheral neurovascular dysfunction Potential alteration in peripheral tissue perfusion (select prior to confirmation of diagnosis) Goals: Patient/caregiver will verbalize understanding of disease process and disease management Date Initiated: 10/25/2015 Goal Status: Active Interventions: Assess signs and symptoms of neuropathy upon admission and as needed Provide education on Management of Neuropathy and Related Ulcers Provide education on Management of Neuropathy upon discharge from the [place] [facility] Activities: Test ordered outside of clinic : 10/25/2015 Notes: [name], [name] ([id number]) Wound/Skin Impairment Nursing Diagnoses: Impaired tissue  integrity Knowledge deficit related to smoking impact on wound healing Knowledge deficit related to ulceration/compromised skin integrity Goals: Patient/caregiver will verbalize understanding of skin care regimen Date Initiated: 10/25/2015 Goal Status: Active Ulcer/skin breakdown will have a volume reduction of 30% by week 4 Date Initiated: 10/25/2015 Goal Status: Active Ulcer/skin breakdown will have a volume reduction of 50% by week 8 Date Initiated: 10/25/2015 Goal Status: Active Ulcer/skin breakdown will have a volume reduction of 80% by week 12 Date Initiated: 10/25/2015 Goal Status: Active Ulcer/skin breakdown will heal within 14 weeks Date Initiated: 10/25/2015 Goal Status: Active Interventions: Assess patient/caregiver ability to obtain necessary supplies Assess patient/caregiver ability to perform ulcer/skin care regimen upon admission and as needed Provide education on ulcer and skin care Treatment Activities: Skin care regimen initiated : 10/25/2015 Topical wound management initiated : 10/25/2015 Notes: Electronic Signature(s) Signed: 12/06/2015 4:14:26 PM By: Regan Lemming BSN, RN Entered By: Regan Lemming on 12/06/2015 14:34:41 [name] ([id number]) -------------------------------------------------------------------------------- Pain Assessment Details Patient Name: [name]. Date of Service: 12/06/2015 2:15 PM Medical Record Patient Account Number: [id number] Number: Treating RN: Baruch Gouty, RN, BSN, Rita [name], [name] [phone] y.o. Other Clinician: Date of Birth/Sex: Male) Treating ROBSON, [place] Primary Care Physician:  Buddy Duty, JEFFREY Physician/Extender: G Referring Physician: Buddy Duty, JEFFREY Weeks in Treatment: 6 Active Problems Location of Pain Severity and Description of Pain Patient Has Paino No Site Locations With Dressing Change: No Pain Management and Medication Current Pain Management: Electronic Signature(s) Signed: 12/06/2015 4:14:26 PM By: Regan Lemming BSN, RN Entered By: Regan Lemming on 12/06/2015 14:28:45 [name] ([id number]) -------------------------------------------------------------------------------- Patient/Caregiver Education Details Patient Name: [name]. Date of Service: 12/06/2015 2:15 PM Medical Record Patient Account Number: [id number] Number: Treating RN: Baruch Gouty, RN, BSN, Rita [birth year] [phone] y.o. Other Clinician: Date of Birth/Gender: Male) Treating ROBSON, [place] Primary Care Physician: Buddy Duty, JEFFREY Physician/Extender: G Referring Physician: Mackey [place] in Treatment: 6 Education Assessment Education Provided To: Patient Education Topics Provided Peripheral Neuropathy: Methods: Explain/Verbal Responses: State content correctly Welcome To [facility] [place]: Methods: Explain/Verbal Responses: State content correctly Wound/Skin Impairment: Methods: Explain/Verbal Responses: State content correctly Electronic Signature(s) Signed: 12/06/2015 4:14:26 PM By: Regan Lemming BSN, RN Entered By: Regan Lemming on 12/06/2015 14:55:16 [name] ([id number]) -------------------------------------------------------------------------------- Wound Assessment Details Patient Name: [name]. Date of Service: 12/06/2015 2:15 PM Medical Record Patient Account Number: [id number] Number: Treating RN: Baruch Gouty, RN, BSN, Rita [name] [phone] y.o. Other Clinician: Date of Birth/Sex: Male) Treating ROBSON, MICHAEL Primary Care Physician: Buddy Duty, JEFFREY Physician/Extender: G Referring Physician: Buddy Duty, JEFFREY Weeks in Treatment: 6 Wound Status Wound Number: 4 Primary Diabetic Wound/Ulcer of the Lower Etiology: Extremity Wound Location: Left Foot - Plantar, Lateral Wound Status: Open Wounding Event: Pressure Injury Comorbid Hypertension, Type II Diabetes, Date Acquired: 1/[name]/2017 History: Neuropathy Weeks Of Treatment: 6 Clustered Wound: No Photos Photo Uploaded By:  Regan Lemming on 12/06/2015 16:05:55 Wound Measurements Length: (cm) 1.8 Width: (cm) 1.3 Depth: (cm) 0.1 Area: (cm) 1.838 Volume: (cm) 0.184 % Reduction in Area: -134.1% % Reduction in Volume: 22% Epithelialization: None Tunneling: No Wound Description Classification: Grade 1 Wound Margin: Distinct, outline attached Exudate Amount: Large Exudate Type: Serosanguineous Exudate Color: red, brown Foul Odor After Cleansing: No Wound Bed Granulation Amount: Medium (34-66%) Exposed Structure Granulation Quality: Pink Fascia Exposed: No Necrotic Amount: Small (1-33%) Fat Layer Exposed: No [name], [name] ([id number]) Necrotic Quality: Adherent Slough Tendon Exposed: No Muscle Exposed: No Joint Exposed: No Bone Exposed: No Limited to Skin Breakdown Periwound Skin Texture Texture Color No Abnormalities Noted: No No Abnormalities Noted: No Callus: Yes Atrophie Blanche: No Crepitus: No Cyanosis: No Excoriation: No Ecchymosis: No Fluctuance: No Erythema: No Friable: No Hemosiderin Staining: No Induration: No Mottled: No Localized Edema: No Pallor: No Rash: No Rubor: No Scarring: No Temperature / Pain Moisture Temperature: No Abnormality No Abnormalities Noted: No Tenderness on Palpation: Yes Dry / Scaly: No Maceration: No Moist: Yes Wound Preparation Ulcer Cleansing: Rinsed/Irrigated with Saline Topical Anesthetic Applied: Other: lidocaine 4%, Treatment Notes Wound #4 (Left, Lateral, Plantar Foot) 1. Cleansed with: Clean wound with Normal Saline 4. Dressing Applied: Aquacel Ag 5. Secondary Dressing Applied Gauze and Kerlix/Conform 6. Footwear/Offloading device applied Other footwear/offloading device applied (specify in notes) 7. Secured with Recruitment consultant) Signed: 12/06/2015 4:14:26 PM By: Regan Lemming BSN, RN Entered By: Regan Lemming on 12/06/2015 14:34:03 [name]  ([id number]) -------------------------------------------------------------------------------- Vitals Details Patient Name: [name]. Date of Service: 12/06/2015 2:15 PM Medical Record Patient Account Number: [id number] Number: Treating RN: Baruch Gouty, RN, BSN, Rita [name][birth year] [phone] y.o. Other Clinician: Date of Birth/Sex: Male) Treating ROBSON, MICHAEL Primary Care Physician: Buddy Duty, JEFFREY Physician/Extender: G Referring Physician: Buddy Duty, JEFFREY Weeks in Treatment: 6 Vital Signs Time Taken:  14:28 Temperature (F): 98.2 Height (in): 70 Pulse (bpm): 106 Weight (lbs): 257 Respiratory Rate (breaths/min): 16 Body Mass Index (BMI): 36.9 Blood Pressure (mmHg): 157/75 Reference Range: 80 - 120 mg / dl Electronic Signature(s) Signed: 12/06/2015 4:14:26 PM By: Regan Lemming BSN, RN Entered By: Regan Lemming on 12/06/2015 14:29:[name]

## 2015-12-07 NOTE — Progress Notes (Signed)
TEOMAN, ZUPON ([id number]) Visit Report for 12/06/2015 Chief Complaint Document Details Patient Name: [name], [name]. Date of Service: 12/06/2015 2:15 PM Medical Record Patient Account Number: [id number] Number: Treating RN: Baruch Gouty, RN, BSN, Rita [birth year] [phone] y.o. Other Clinician: Date of Birth/Sex: Male) Treating Maysin Carstens Primary Care Physician/Extender: Rose Fillers, JEFFREY Physician: Referring Physician: Buddy Duty, JEFFREY Weeks in Treatment: 6 Information Obtained from: Patient Chief Complaint Chronic left calf traumatic ulcer (healed). Left third toe ulcer (healed). New right calf ulcer. 10/25/15; patient returns today with a wound over his left lateral foot Electronic Signature(s) Signed: 12/06/2015 5:53:31 PM By: [name] MD Entered By: [name] on 12/06/2015 14:53:33 [name], [name] ([id number]) -------------------------------------------------------------------------------- Debridement Details Patient Name: [name]. Date of Service: 12/06/2015 2:15 PM Medical Record Patient Account Number: [id number] Number: Treating RN: Baruch Gouty, RN, BSN, Rita 02-10-1961 [phone] y.o. Other Clinician: Date of Birth/Sex: Male) Treating Audrea Bolte Primary Care Physician/Extender: Rose Fillers, JEFFREY Physician: Referring Physician: Buddy Duty, JEFFREY Weeks in Treatment: 6 Debridement Performed for Wound #4 Left,Lateral,Plantar Foot Assessment: Performed By: Physician Ricard Dillon, MD Debridement: Debridement Pre-procedure Yes Verification/Time Out Taken: Start Time: 14:44 Pain Control: Lidocaine 4% Topical Solution Level: Skin/Subcutaneous Tissue Total Area Debrided (L x 1.8 (cm) x 1.3 (cm) = 2.34 (cm) W): Tissue and other Non-Viable, Callus, Fibrin/Slough material debrided: Instrument: Curette Bleeding: Minimum Hemostasis Achieved: Pressure End Time: 14:49 Procedural Pain: 0 Post Procedural Pain: 0 Response to Treatment: Procedure was  tolerated well Post Debridement Measurements of Total Wound Length: (cm) 1.8 Width: (cm) 1.3 Depth: (cm) 0.1 Volume: (cm) 0.184 Post Procedure Diagnosis Same as Pre-procedure Electronic Signature(s) Signed: 12/06/2015 4:14:26 PM By: Regan Lemming BSN, RN Signed: 12/06/2015 5:53:31 PM By: [name] MD Entered By: [name] on 12/06/2015 14:52:59 [name] ([id number] Brood, [name] ([id number]) -------------------------------------------------------------------------------- HPI Details Patient Name: [name], [name]. Date of Service: 12/06/2015 2:15 PM Medical Record Patient Account Number: [id number] Number: Treating RN: Baruch Gouty, RN, BSN, Rita 1960-12-14 [phone] y.o. Other Clinician: Date of Birth/Sex: Male) Treating Duanne Duchesne Primary Care Physician/Extender: Rose Fillers, JEFFREY Physician: Referring Physician: Buddy Duty, JEFFREY Weeks in Treatment: 6 History of Present Illness HPI Description: Pleasant 55 year old with history of diabetes (Hgb A1c 10.8 in 2014) and peripheral neuropathy. No PVD. L ABI 1.1. Status post right great toe partial amputation years ago. He was at work and on 10/22/2014, was injured by a cart, and suffered an ulceration to his left anterior calf. He says that it subsequently became infected, and he was treated with a course of antibiotics. He was found on initial exam to have an ulceration on the dorsum of his left third toe. He was unaware of this and attributes it to pressure from his steel toed boots. More recently he injured his right anterior calf on a cart. Ambulating normally per his baseline. He has been undergoing regular debridements, applying mupirocin cream, and an Ace wrap for edema control. He returns to clinic for follow-up and is without complaints. No pain. No fever or chills. No drainage. 10/25/15; this is a 55 year old man who has type II diabetes with diabetic polyneuropathy. He tells me that he fractured his left  fifth metatarsal in June 2016 when he presented with swelling. He does not recall a specific injury. His hemoglobin A1c was apparently too high at the time for consideration of surgery and he was put in some form of offloading. Ultimately he went to surgery in December with an allograft from his calcaneus to this site,  plate and screws. He had an x-ray of the foot in March that showed concerns about nonunion. He tells me that in March he had to move and basically moved himself. He was on his foot a lot and then noticed some drainage from an open area. He has been following with his orthopedic surgeon Dr. Victorino DikeHewitt. He has been applying a felt donut, dry dressing and using his heel healing sandal. 11/01/15; this is a patient I saw last week for the first time. He had a small open wound on the plantar aspect of his left foot at roughly the level of the base of his fifth metatarsal. He had a considerable degree of thickened skin around this wound on the plantar aspect which I thought was from chronic pressure on this area. He tells us that he had drainage over the course of the week. No systemic symptoms. 11/08/15; culture last week grew Citrobacter korseri. This should've been sensitive to the Augmentin I gave him. He has seen Dr. Victorino DikeHewitt who did his initial surgery and according the patient the plan is to give this another month and then the hardware might need to come out of this. This seems like a reasonable plan. I will adjust his antibiotics to ciprofloxacin which probably should continue for at least another 2 weeks. I gave him 10 days worth today 11/22/15 the patient has completed antibiotics. He has an appointment with Dr. Victorino DikeHewitt this Friday. There is improved dimensions around the wound on the left fifth metatarsal base 11/29/15; the patient has completed antibiotics last week. Apparently his appointment with Dr. Victorino DikeHewitt it is not until this Friday. Dimensions are roughly the same. 12/06/15; saw Dr.  Victorino DikeHewitt. No x-ray told the end of the month, next appointment June 30. We have been using Aquacel Ag [name], Anant F. ([id number]) Electronic Signature(s) Signed: 12/06/2015 5:53:31 PM By: [name] Najjarobson, Terissa Haffey MD Entered By: [name] Najjarobson, Braylan Faul on 12/06/2015 14:54:39 [name], Ayham F. ([id number]) -------------------------------------------------------------------------------- Physical Exam Details Patient Name: [name], [name] F. Date of Service: 12/06/2015 2:15 PM Medical Record Patient Account Number: [id number] Number: Treating RN: Clover MealyAfful, RN, BSN, Rita [birth year] [phone](54 y.o. Other Clinician: Date of Birth/Sex: Male) Treating Aryaa Bunting Primary Care Physician/Extender: Drusilla KannerG KERR, JEFFREY Physician: Referring Physician: Sharl MaKERR, JEFFREY Weeks in Treatment: 6 Notes Wound exam; we continue to make improvements wound is down 1 cm in length. Still surgical debridement required Electronic Signature(s) Signed: 12/06/2015 5:53:31 PM By: [name] Najjarobson, Dinari Stgermaine MD Entered By: [name] Najjarobson, Jonda Alanis on 12/06/2015 14:55:07 [name], [name] F. ([id number]) -------------------------------------------------------------------------------- Physician Orders Details Patient Name: [name], [name] F. Date of Service: 12/06/2015 2:15 PM Medical Record Patient Account Number: [id number] Number: Treating RN: Clover MealyAfful, RN, BSN, Rita [birth year] [phone](54 y.o. Other Clinician: Date of Birth/Sex: Male) Treating Anuradha Chabot Primary Care Physician/Extender: Drusilla KannerG KERR, JEFFREY Physician: Referring Physician: Alwyn PeaKERR, JEFFREY Weeks in Treatment: 6 Verbal / Phone Orders: Yes Clinician: Afful, RN, BSN, Rita Read Back and Verified: Yes Diagnosis Coding Wound Cleansing Wound #4 Left,Lateral,Plantar Foot o Cleanse wound with mild soap and water o May Shower, gently pat wound dry prior to applying new dressing. o May shower with protection. Anesthetic Wound #4 Left,Lateral,Plantar Foot o Topical Lidocaine 4% cream applied to  wound bed prior to debridement Primary Wound Dressing Wound #4 Left,Lateral,Plantar Foot o Aquacel Ag Secondary Dressing Wound #4 Left,Lateral,Plantar Foot o Gauze and Kerlix/Conform Dressing Change Frequency Wound #4 Left,Lateral,Plantar Foot o Change dressing every day. Follow-up Appointments Wound #4 Left,Lateral,Plantar Foot o Return Appointment in 1 week. Off-Loading Wound #4 Left,Lateral,Plantar Foot o  Other: - felt Additional Orders / Instructions Wound #4 Left,Lateral,Plantar Foot [name], BOEH. ([id number]) o Increase protein intake. o Activity as tolerated Electronic Signature(s) Signed: 12/06/2015 4:14:26 PM By: Regan Lemming BSN, RN Signed: 12/06/2015 5:53:31 PM By: [name] MD Entered By: Regan Lemming on 12/06/2015 14:48:59 [name] ([id number]) -------------------------------------------------------------------------------- Problem List Details Patient Name: [name], [name]. Date of Service: 12/06/2015 2:15 PM Medical Record Patient Account Number: [id number] Number: Treating RN: Baruch Gouty, RN, BSN, Rita [birth year] [phone] y.o. Other Clinician: Date of Birth/Sex: Male) Treating Mckynleigh Mussell Primary Care Physician/Extender: Rose Fillers, JEFFREY Physician: Referring Physician: Buddy Duty, JEFFREY Weeks in Treatment: 6 Active Problems ICD-10 Encounter Code Description Active Date Diagnosis L97.521 Non-pressure chronic ulcer of other part of left foot limited 10/25/2015 Yes to breakdown of skin E11.621 Type 2 diabetes mellitus with foot ulcer 10/25/2015 Yes E11.42 Type 2 diabetes mellitus with diabetic polyneuropathy 10/25/2015 Yes L03.116 Cellulitis of left lower limb 11/01/2015 Yes Inactive Problems Resolved Problems Electronic Signature(s) Signed: 12/06/2015 5:53:31 PM By: [name] MD Entered By: [name] on 12/06/2015 14:52:46 [name]  ([id number]) -------------------------------------------------------------------------------- Progress Note Details Patient Name: [name]. Date of Service: 12/06/2015 2:15 PM Medical Record Patient Account Number: [id number] Number: Treating RN: Baruch Gouty, RN, BSN, Rita 15-Oct-1960 [phone] y.o. Other Clinician: Date of Birth/Sex: Male) Treating Violet Cart Primary Care Physician/Extender: Rose Fillers, JEFFREY Physician: Referring Physician: Buddy Duty, JEFFREY Weeks in Treatment: 6 Subjective Chief Complaint Information obtained from Patient Chronic left calf traumatic ulcer (healed). Left third toe ulcer (healed). New right calf ulcer. 10/25/15; patient returns today with a wound over his left lateral foot History of Present Illness (HPI) Pleasant 55 year old with history of diabetes (Hgb A1c 10.8 in 2014) and peripheral neuropathy. No PVD. L ABI 1.1. Status post right great toe partial amputation years ago. He was at work and on 10/22/2014, was injured by a cart, and suffered an ulceration to his left anterior calf. He says that it subsequently became infected, and he was treated with a course of antibiotics. He was found on initial exam to have an ulceration on the dorsum of his left third toe. He was unaware of this and attributes it to pressure from his steel toed boots. More recently he injured his right anterior calf on a cart. Ambulating normally per his baseline. He has been undergoing regular debridements, applying mupirocin cream, and an Ace wrap for edema control. He returns to clinic for follow-up and is without complaints. No pain. No fever or chills. No drainage. 10/25/15; this is a 55 year old man who has type II diabetes with diabetic polyneuropathy. He tells me that he fractured his left fifth metatarsal in June 2016 when he presented with swelling. He does not recall a specific injury. His hemoglobin A1c was apparently too high at the time for consideration of  surgery and he was put in some form of offloading. Ultimately he went to surgery in December with an allograft from his calcaneus to this site, plate and screws. He had an x-ray of the foot in March that showed concerns about nonunion. He tells me that in March he had to move and basically moved himself. He was on his foot a lot and then noticed some drainage from an open area. He has been following with his orthopedic surgeon Dr. Doran Durand. He has been applying a felt donut, dry dressing and using his heel healing sandal. 11/01/15; this is a patient I saw last week for the first time. He had a small  open wound on the plantar aspect of his left foot at roughly the level of the base of his fifth metatarsal. He had a considerable degree of thickened skin around this wound on the plantar aspect which I thought was from chronic pressure on this area. He tells [place] that he had drainage over the course of the week. No systemic symptoms. 11/08/15; culture last week grew Citrobacter korseri. This should've been sensitive to the Augmentin I gave him. He has seen Dr. Doran Durand who did his initial surgery and according the patient the plan is to give this another month and then the hardware might need to come out of this. This seems like a reasonable plan. I will adjust his antibiotics to ciprofloxacin which probably should continue for at least another 2 weeks. I gave him 10 days worth today [name], [name] ([id number]) 11/22/15 the patient has completed antibiotics. He has an appointment with Dr. Doran Durand this Friday. There is improved dimensions around the wound on the left fifth metatarsal base 11/29/15; the patient has completed antibiotics last week. Apparently his appointment with Dr. Doran Durand it is not until this Friday. Dimensions are roughly the same. 12/06/15; saw Dr. Doran Durand. No x-ray told the end of the month, next appointment June 30. We have been using Aquacel Ag Objective Constitutional Vitals Time Taken:  2:28 PM, Height: 70 in, Weight: 257 lbs, BMI: 36.9, Temperature: 98.2 F, Pulse: 106 bpm, Respiratory Rate: 16 breaths/min, Blood Pressure: 157/75 mmHg. Integumentary (Hair, Skin) Wound #4 status is Open. Original cause of wound was Pressure Injury. The wound is located on the [place]. The wound measures 1.8cm length x 1.3cm width x 0.1cm depth; 1.838cm^2 area and 0.184cm^3 volume. The wound is limited to skin breakdown. There is no tunneling noted. There is a large amount of serosanguineous drainage noted. The wound margin is distinct with the outline attached to the wound base. There is medium (34-66%) pink granulation within the wound bed. There is a small (1-33%) amount of necrotic tissue within the wound bed including Adherent Slough. The periwound skin appearance exhibited: Callus, Moist. The periwound skin appearance did not exhibit: Crepitus, Excoriation, Fluctuance, Friable, Induration, Localized Edema, Rash, Scarring, Dry/Scaly, Maceration, Atrophie Blanche, Cyanosis, Ecchymosis, Hemosiderin Staining, Mottled, Pallor, Rubor, Erythema. Periwound temperature was noted as No Abnormality. The periwound has tenderness on palpation. Assessment Active Problems ICD-10 L97.521 - Non-pressure chronic ulcer of other part of left foot limited to breakdown of skin E11.621 - Type 2 diabetes mellitus with foot ulcer E11.42 - Type 2 diabetes mellitus with diabetic polyneuropathy L03.116 - Cellulitis of left lower limb [name], HINCH. ([id number]) Procedures Wound #4 Wound #4 is a Diabetic Wound/Ulcer of the Lower Extremity located on the Left,Lateral,Plantar Foot . There was a Skin/Subcutaneous Tissue Debridement [id number]) debridement with total area of 2.34 sq cm performed by Ricard Dillon, MD. with the following instrument(s): Curette to remove Non-Viable tissue/material including Fibrin/Slough and Callus after achieving pain control using Lidocaine 4%  Topical Solution. A time out was conducted prior to the start of the procedure. A Minimum amount of bleeding was controlled with Pressure. The procedure was tolerated well with a pain level of 0 throughout and a pain level of 0 following the procedure. Post Debridement Measurements: 1.8cm length x 1.3cm width x 0.1cm depth; 0.184cm^3 volume. Post procedure Diagnosis Wound #4: Same as Pre-Procedure Plan Wound Cleansing: Wound #4 Left,Lateral,Plantar Foot: Cleanse wound with mild soap and water May Shower, gently pat wound dry prior to applying new dressing. May shower  with protection. Anesthetic: Wound #4 Left,Lateral,Plantar Foot: Topical Lidocaine 4% cream applied to wound bed prior to debridement Primary Wound Dressing: Wound #4 Left,Lateral,Plantar Foot: Aquacel Ag Secondary Dressing: Wound #4 Left,Lateral,Plantar Foot: Gauze and Kerlix/Conform Dressing Change Frequency: Wound #4 Left,Lateral,Plantar Foot: Change dressing every day. Follow-up Appointments: Wound #4 Left,Lateral,Plantar Foot: Return Appointment in 1 week. Off-Loading: Wound #4 Left,Lateral,Plantar Foot: Other: - felt Additional Orders / Instructions: Wound #4 Left,Lateral,Plantar Foot: Increase protein intake. Activity as tolerated [name], PLACK. ([id number]) No change to silver alginate kerlix and conform appears to be smaller, down 1cm n length Electronic Signature(s) Signed: 12/06/2015 5:53:31 PM By: [name] MD Entered By: [name] on 12/06/2015 14:57:52 Ambrosius, [name] ([id number]) -------------------------------------------------------------------------------- SuperBill Details Patient Name: [name]. Date of Service: 12/06/2015 Medical Record Patient Account Number: [id number] Number: Treating RN: Baruch Gouty, RN, BSN, Rita Nov 28, 1960 [phone] y.o. Other Clinician: Date of Birth/Sex: Male) Treating Decklyn Hyder Primary Care Physician: Buddy Duty, JEFFREY Physician/Extender:  G Referring Physician: Buddy Duty, JEFFREY Weeks in Treatment: 6 Diagnosis Coding ICD-10 Codes Code Description L97.521 Non-pressure chronic ulcer of other part of left foot limited to breakdown of skin E11.621 Type 2 diabetes mellitus with foot ulcer E11.42 Type 2 diabetes mellitus with diabetic polyneuropathy L03.116 Cellulitis of left lower limb Facility Procedures CPT4 Code: [id number] Description: [id number] - DEB SUBQ TISSUE 20 SQ CM/< ICD-10 Description Diagnosis E11.621 Type 2 diabetes mellitus with foot ulcer Modifier: Quantity: 1 Physician Procedures CPT4 Code: [id number] Description: [id number] - WC PHYS SUBQ TISS 20 SQ CM ICD-10 Description Diagnosis E11.621 Type 2 diabetes mellitus with foot ulcer Modifier: Quantity: 1 Electronic Signature(s) Signed: 12/06/2015 5:53:31 PM By: [name] MD Entered By: [name] on 12/06/2015 14:58:20

## 2015-12-13 ENCOUNTER — Encounter: Payer: Managed Care, Other (non HMO) | Admitting: Internal Medicine

## 2015-12-13 DIAGNOSIS — E11621 Type 2 diabetes mellitus with foot ulcer: Secondary | ICD-10-CM | POA: Diagnosis not present

## 2015-12-20 ENCOUNTER — Encounter: Payer: Managed Care, Other (non HMO) | Admitting: Internal Medicine

## 2015-12-20 DIAGNOSIS — E11621 Type 2 diabetes mellitus with foot ulcer: Secondary | ICD-10-CM | POA: Diagnosis not present

## 2015-12-21 NOTE — Progress Notes (Signed)
[name] ([id number]) Visit Report for 12/20/2015 Arrival Information Details Patient Name: [name], [name]. Date of Service: 12/20/2015 3:30 PM Medical Record Patient Account Number: [id number] Number: Treating RN: Baruch Gouty, RN, BSN, Rita [birth year] [phone] y.o. Other Clinician: Date of Birth/Sex: Male) Treating ROBSON, MICHAEL Primary Care Physician: Buddy Duty, JEFFREY Physician/Extender: G Referring Physician: Buddy Duty, JEFFREY Weeks in Treatment: 8 Visit Information History Since Last Visit Added or deleted any medications: No Patient Arrived: Ambulatory Any new allergies or adverse reactions: No Arrival Time: 15:33 Had a fall or experienced change in No Accompanied By: self activities of daily living that may affect Transfer Assistance: None risk of falls: Patient Identification Verified: Yes Signs or symptoms of abuse/neglect since last No Secondary Verification Process Yes visito Completed: Hospitalized since last visit: No Patient Requires Transmission-Based No Has Dressing in Place as Prescribed: Yes Precautions: Pain Present Now: No Patient Has Alerts: No Electronic Signature(s) Signed: 12/20/2015 4:58:21 PM By: Regan Lemming BSN, RN Entered By: Regan Lemming on 12/20/2015 15:34:09 [name] ([id number]) -------------------------------------------------------------------------------- Encounter Discharge Information Details Patient Name: [name]. Date of Service: 12/20/2015 3:30 PM Medical Record Patient Account Number: [id number] Number: Treating RN: Baruch Gouty, RN, BSN, Rita 1960-12-01 [phone] y.o. Other Clinician: Date of Birth/Sex: Male) Treating ROBSON, MICHAEL Primary Care Physician: Buddy Duty, JEFFREY Physician/Extender: G Referring Physician: Buddy Duty, JEFFREY Weeks in Treatment: 8 Encounter Discharge Information Items Discharge Pain Level: 0 Discharge Condition: Stable Ambulatory Status: Ambulatory Discharge Destination: Home Transportation:  Private Auto Accompanied By: self Schedule Follow-up Appointment: No Medication Reconciliation completed and provided to Patient/Care No Arville Postlewaite: Provided on Clinical Summary of Care: 12/20/2015 Form Type Recipient Paper Patient HP Electronic Signature(s) Signed: 12/20/2015 4:58:21 PM By: Regan Lemming BSN, RN Previous Signature: 12/20/2015 4:21:18 PM Version By: Ruthine Dose Entered By: Regan Lemming on 12/20/2015 16:22:40 [name] ([id number]) -------------------------------------------------------------------------------- Lower Extremity Assessment Details Patient Name: [name]. Date of Service: 12/20/2015 3:30 PM Medical Record Patient Account Number: [id number] Number: Treating RN: Baruch Gouty, RN, BSN, Rita 1960-12-26 [phone] y.o. Other Clinician: Date of Birth/Sex: Male) Treating ROBSON, [place] Primary Care Physician: Buddy Duty, JEFFREY Physician/Extender: G Referring Physician: Buddy Duty, JEFFREY Weeks in Treatment: 8 Edema Assessment Assessed: [Left: No] [Right: No] Edema: [Left: N] [Right: o] Calf Left: Right: Point of Measurement: 36 cm From Medial Instep cm cm Ankle Left: Right: Point of Measurement: 10 cm From Medial Instep cm cm Vascular Assessment Claudication: Claudication Assessment [Left:None] Pulses: Posterior Tibial Dorsalis Pedis Palpable: [Left:Yes] Extremity colors, hair growth, and conditions: Extremity Color: [Left:Mottled] Hair Growth on Extremity: [Left:No] Temperature of Extremity: [Left:Warm] Capillary Refill: [Left:< 3 seconds] Electronic Signature(s) Signed: 12/20/2015 4:58:21 PM By: Regan Lemming BSN, RN Entered By: Regan Lemming on 12/20/2015 15:35:30 [name] ([id number]) -------------------------------------------------------------------------------- Multi Wound Chart Details Patient Name: [name]. Date of Service: 12/20/2015 3:30 PM Medical Record Patient Account Number: [id number] Number: Treating RN:  Baruch Gouty, RN, BSN, Rita 1961/04/14 [phone] y.o. 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] Comorbid History: [4:Hypertension, Type II Diabetes, Neuropathy] [N/A:N/A] Date Acquired: [4:07/12/2015] [N/A:N/A] Weeks of Treatment: [4:8] [N/A:N/A] Wound Status: [4:Open] [N/A:N/A] Measurements L x W x  D 1x1.4x0.1 [N/A:N/A] (cm) Area (cm) : [4:1.1] [N/A:N/A] Volume (cm) : [4:0.11] [N/A:N/A] % Reduction in Area: [4:-40.10%] [N/A:N/A] % Reduction in Volume: 53.40% [N/A:N/A] Classification: [4:Grade 1] [N/A:N/A] Exudate Amount: [4:Large] [N/A:N/A] Exudate Type: [4:Serosanguineous] [N/A:N/A] Exudate Color: [4:red, brown] [N/A:N/A] Wound Margin: [4:Distinct, outline attached N/A] Granulation Amount: [4:Medium (34-66%)] [N/A:N/A] Granulation Quality: [4:Pink] [N/A:N/A] Necrotic Amount: [4:Small (1-33%)] [N/A:N/A] Exposed Structures: [4:Fascia: No Fat: No Tendon: No Muscle: No] [N/A:N/A] Joint: No Bone: No Limited to Skin Breakdown Epithelialization: None N/A N/A Periwound Skin Texture: Callus: Yes N/A N/A Edema: No Excoriation: No Induration: No Crepitus: No Fluctuance: No Friable: No Rash: No Scarring: No Periwound Skin Maceration: Yes N/A N/A Moisture: Moist: Yes Dry/Scaly: No Periwound Skin Color: Atrophie Blanche: No N/A N/A Cyanosis: No Ecchymosis: No Erythema: No Hemosiderin Staining: No Mottled: No Pallor: No Rubor: No Temperature: No Abnormality N/A  N/A Tenderness on Yes N/A N/A Palpation: Wound Preparation: Ulcer Cleansing: N/A N/A Rinsed/Irrigated with Saline Topical Anesthetic Applied: Other: lidocaine 4% Treatment Notes Electronic Signature(s) Signed: 12/20/2015 4:58:21 PM By: Regan Lemming BSN, RN Entered By: Regan Lemming on 12/20/2015 15:53:47 [name] ([id number]) -------------------------------------------------------------------------------- [place] Details Patient Name: HOUGHTON, ABELAR. Date of Service: 12/20/2015 3:30 PM Medical Record Patient Account Number: [id number] Number: Treating RN: Baruch Gouty, RN, BSN, Rita [birth year] [phone] y.o. Other Clinician: Date of Birth/Sex: Male) Treating ROBSON, [place] Primary Care Physician: Buddy Duty, JEFFREY Physician/Extender: G Referring Physician: Buddy Duty, JEFFREY Weeks in Treatment: 8 Active Inactive Orientation to the Wound Care Program Nursing Diagnoses: Knowledge deficit related to the wound healing center program Goals: Patient/caregiver will verbalize understanding of the [facility] Date Initiated: 10/25/2015 Goal Status: Active Interventions: Provide education on orientation to the wound center Notes: Peripheral Neuropathy Nursing Diagnoses: Knowledge deficit related to disease process and management of peripheral neurovascular dysfunction Potential alteration in peripheral tissue perfusion (select prior to confirmation of diagnosis) Goals: Patient/caregiver will verbalize understanding of disease process and disease management Date Initiated: 10/25/2015 Goal Status: Active Interventions: Assess signs and symptoms of neuropathy upon admission and as needed Provide education on Management of Neuropathy and Related Ulcers Provide education on Management of Neuropathy upon discharge from the [facility] Activities: Test ordered outside of clinic : 10/25/2015 Notes: SQUIRE, LOSER ([id number]) Wound/Skin  Impairment Nursing Diagnoses: Impaired tissue integrity Knowledge deficit related to smoking impact on wound healing Knowledge deficit related to ulceration/compromised skin integrity Goals: Patient/caregiver will verbalize understanding of skin care regimen Date Initiated: 10/25/2015 Goal Status: Active Ulcer/skin breakdown will have a volume reduction of 30% by week 4 Date Initiated: 10/25/2015 Goal Status: Active Ulcer/skin breakdown will have a volume reduction of 50% by week 8 Date Initiated: 10/25/2015 Goal Status: Active Ulcer/skin breakdown will have a volume reduction of 80% by week 12 Date Initiated: 10/25/2015 Goal Status: Active Ulcer/skin breakdown will heal within 14 weeks Date Initiated: 10/25/2015 Goal Status: Active Interventions: Assess patient/caregiver ability to obtain necessary supplies Assess patient/caregiver ability to perform ulcer/skin care regimen upon admission and as needed Provide education on ulcer and skin care Treatment Activities: Skin care regimen initiated : 10/25/2015 Topical wound management initiated : 10/25/2015 Notes: Electronic Signature(s) Signed: 12/20/2015 4:58:21 PM By: Regan Lemming BSN, RN Entered By: Regan Lemming on 12/20/2015 15:53:33 [name] ([id number]) -------------------------------------------------------------------------------- Pain Assessment Details Patient Name: [name]. Date of Service: 12/20/2015 3:30 PM Medical Record Patient Account Number: [id number] Number: Treating RN: Baruch Gouty, RN, BSN, Rita 24-Mar-1961 [phone] y.o. Other Clinician: Date of Birth/Sex: Male) Treating ROBSON,  MICHAEL Primary Care Physician: Buddy Duty, JEFFREY Physician/Extender: G Referring Physician: Delrae Rend Weeks in Treatment: 8 Active Problems Location of Pain Severity and Description of Pain Patient Has Paino No Site Locations With Dressing Change: No Pain Management and Medication Current Pain Management: Electronic  Signature(s) Signed: 12/20/2015 4:58:21 PM By: Regan Lemming BSN, RN Entered By: Regan Lemming on 12/20/2015 15:34:18 [name] ([id number]) -------------------------------------------------------------------------------- Patient/Caregiver Education Details Patient Name: [name]. Date of Service: 12/20/2015 3:30 PM Medical Record Patient Account Number: [id number] Number: Treating RN: Baruch Gouty, RN, BSN, Rita [birth year] [phone] y.o. Other Clinician: Date of Birth/Gender: Male) Treating ROBSON, [place] Primary Care Physician: Buddy Duty, JEFFREY Physician/Extender: G Referring Physician: Mackey Birchwood in Treatment: 8 Education Assessment Education Provided To: Patient Education Topics Provided Peripheral Neuropathy: Methods: Explain/Verbal Responses: State content correctly Welcome To [facility]: Methods: Explain/Verbal Responses: State content correctly Wound/Skin Impairment: Methods: Explain/Verbal Responses: State content correctly Electronic Signature(s) Signed: 12/20/2015 4:58:21 PM By: Regan Lemming BSN, RN Entered By: Regan Lemming on 12/20/2015 16:23:03 [name] ([id number]) -------------------------------------------------------------------------------- Wound Assessment Details Patient Name: [name]. Date of Service: 12/20/2015 3:30 PM Medical Record Patient Account Number: [id number] Number: Treating RN: Baruch Gouty, RN, BSN, Rita 10/09/1960 [phone] y.o. Other Clinician: Date of Birth/Sex: Male) Treating ROBSON, MICHAEL Primary Care Physician: Buddy Duty, JEFFREY Physician/Extender: G Referring Physician: Buddy Duty, JEFFREY Weeks in Treatment: 8 Wound Status Wound Number: 4 Primary Diabetic Wound/Ulcer of the Lower Etiology: Extremity Wound Location: Left Foot - Plantar, Lateral Wound Status: Open Wounding Event: Pressure Injury Comorbid Hypertension, Type II Diabetes, Date Acquired: 07/12/2015 History: Neuropathy Weeks Of  Treatment: 8 Clustered Wound: No Photos Photo Uploaded By: Regan Lemming on 12/20/2015 16:57:51 Wound Measurements Length: (cm) 1 Width: (cm) 1.4 Depth: (cm) 0.1 Area: (cm) 1.1 Volume: (cm) 0.11 % Reduction in Area: -40.1% % Reduction in Volume: 53.4% Epithelialization: None Tunneling: No Undermining: No Wound Description Classification: Grade 1 Wound Margin: Distinct, outline attached Exudate Amount: Large TERIC, BOGDA. ([id number]) Foul Odor After Cleansing: No Exudate Type: Serosanguineous Exudate Color: red, brown Wound Bed Granulation Amount: Medium (34-66%) Exposed Structure Granulation Quality: Pink Fascia Exposed: No Necrotic Amount: Small (1-33%) Fat Layer Exposed: No Necrotic Quality: Adherent Slough Tendon Exposed: No Muscle Exposed: No Joint Exposed: No Bone Exposed: No Limited to Skin Breakdown Periwound Skin Texture Texture Color No Abnormalities Noted: No No Abnormalities Noted: No Callus: Yes Atrophie Blanche: No Crepitus: No Cyanosis: No Excoriation: No Ecchymosis: No Fluctuance: No Erythema: No Friable: No Hemosiderin Staining: No Induration: No Mottled: No Localized Edema: No Pallor: No Rash: No Rubor: No Scarring: No Temperature / Pain Moisture Temperature: No Abnormality No Abnormalities Noted: No Tenderness on Palpation: Yes Dry / Scaly: No Maceration: Yes Moist: Yes Wound Preparation Ulcer Cleansing: Rinsed/Irrigated with Saline Topical Anesthetic Applied: Other: lidocaine 4%, Treatment Notes Wound #4 (Left, Lateral, Plantar Foot) 1. Cleansed with: Cleanse wound with antibacterial soap and water 3. Peri-wound Care: Barrier cream Moisturizing lotion 4. Dressing Applied: Aquacel Ag 5. Secondary Dressing Applied Gauze and Kerlix/Conform [name], CASTOR. ([id number]) 7. Secured with Tape 3 Layer Compression System - Left Lower Extremity Electronic Signature(s) Signed: 12/20/2015 4:58:21 PM By: Regan Lemming BSN,  RN Entered By: Regan Lemming on 12/20/2015 15:46:58 [name] ([id number]) -------------------------------------------------------------------------------- Vitals Details Patient Name: [name]. Date of Service: 12/20/2015 3:30 PM Medical Record Patient Account Number: [id number] Number: Treating RN: Baruch Gouty, RN, BSN, Rita Jan 27, 1961 [phone] y.o. Other Clinician: Date of Birth/Sex: Male) Treating ROBSON, [place] Primary Care Physician:  Buddy Duty, JEFFREY Physician/Extender: G Referring Physician: Buddy Duty, JEFFREY Weeks in Treatment: 8 Vital Signs Time Taken: 15:32 Temperature (F): 98.2 Height (in): 70 Pulse (bpm): 96 Weight (lbs): 257 Respiratory Rate (breaths/min): 17 Body Mass Index (BMI): 36.9 Blood Pressure (mmHg): 136/79 Reference Range: 80 - 120 mg / dl Electronic Signature(s) Signed: 12/20/2015 4:58:21 PM By: Regan Lemming BSN, RN Entered By: Regan Lemming on 12/20/2015 15:36:15

## 2015-12-21 NOTE — Progress Notes (Signed)
[name], [name] ([id number]) Visit Report for 12/20/2015 Chief Complaint Document Details Patient Name: [name], [name]. Date of Service: 12/20/2015 3:30 PM Medical Record Patient Account Number: [id number] Number: Treating RN: Baruch Gouty, RN, BSN, Rita 06/24/1961 [phone] y.o. Other Clinician: Date of Birth/Sex: Male) Treating ROBSON, MICHAEL Primary Care Physician/Extender: Rose Fillers, JEFFREY Physician: Referring Physician: Buddy Duty, JEFFREY Weeks in Treatment: 8 Information Obtained from: Patient Chief Complaint Chronic left calf traumatic ulcer (healed). Left third toe ulcer (healed). New right calf ulcer. 10/25/15; patient returns today with a wound over his left lateral foot Electronic Signature(s) Signed: 12/20/2015 4:37:54 PM By: [name] MD Entered By: [name] on 12/20/2015 16:06:24 [name] ([id number]) -------------------------------------------------------------------------------- Debridement Details Patient Name: [name]. Date of Service: 12/20/2015 3:30 PM Medical Record Patient Account Number: [id number] Number: Treating RN: Baruch Gouty, RN, BSN, Rita Jan 20, 1961 [phone] y.o. Other Clinician: Date of Birth/Sex: Male) Treating ROBSON, MICHAEL Primary Care Physician/Extender: Rose Fillers, JEFFREY Physician: Referring Physician: Buddy Duty, JEFFREY Weeks in Treatment: 8 Debridement Performed for Wound #4 Left,Lateral,Plantar Foot Assessment: Performed By: Physician Ricard Dillon, MD Debridement: Debridement Pre-procedure Yes Verification/Time Out Taken: Start Time: 15:52 Pain Control: Lidocaine 4% Topical Solution Level: Skin/Subcutaneous Tissue Total Area Debrided (L x 1 (cm) x 1.4 (cm) = 1.4 (cm) W): Tissue and other Non-Viable, Callus, Fibrin/Slough, Subcutaneous material debrided: Instrument: Curette Bleeding: Moderate Hemostasis Achieved: Silver Nitrate End Time: 16:00 Procedural Pain: 0 Post Procedural Pain: 0 Response to  Treatment: Procedure was tolerated well Post Debridement Measurements of Total Wound Length: (cm) 1 Width: (cm) 1.4 Depth: (cm) 0.1 Volume: (cm) 0.11 Post Procedure Diagnosis Same as Pre-procedure Electronic Signature(s) Signed: 12/20/2015 4:37:54 PM By: [name] MD Signed: 12/20/2015 4:58:21 PM By: Regan Lemming BSN, RN Entered By: [name] on 12/20/2015 16:06:08 [name] ([name] ([id number]) -------------------------------------------------------------------------------- HPI Details Patient Name: [name], [name]. Date of Service: 12/20/2015 3:30 PM Medical Record Patient Account Number: [id number] Number: Treating RN: Baruch Gouty, RN, BSN, Rita Nov 19, 1960 [phone] y.o. Other Clinician: Date of Birth/Sex: Male) Treating ROBSON, MICHAEL Primary Care Physician/Extender: Rose Fillers, JEFFREY Physician: Referring Physician: Buddy Duty, JEFFREY Weeks in Treatment: 8 History of Present Illness HPI Description: Pleasant 55 year old with history of diabetes (Hgb A1c 10.8 in 2014) and peripheral neuropathy. No PVD. L ABI 1.1. Status post right great toe partial amputation years ago. He was at work and on 10/22/2014, was injured by a cart, and suffered an ulceration to his left anterior calf. He says that it subsequently became infected, and he was treated with a course of antibiotics. He was found on initial exam to have an ulceration on the dorsum of his left third toe. He was unaware of this and attributes it to pressure from his steel toed boots. More recently he injured his right anterior calf on a cart. Ambulating normally per his baseline. He has been undergoing regular debridements, applying mupirocin cream, and an Ace wrap for edema control. He returns to clinic for follow-up and is without complaints. No pain. No fever or chills. No drainage. 10/25/15; this is a 55 year old man who has type II diabetes with diabetic polyneuropathy. He tells me that  he fractured his left fifth metatarsal in June 2016 when he presented with swelling. He does not recall a specific injury. His hemoglobin A1c was apparently too high at the time for consideration of surgery and he was put in some form of offloading. Ultimately he went to surgery in December with an allograft from his calcaneus to  this site, plate and screws. He had an x-ray of the foot in March that showed concerns about nonunion. He tells me that in March he had to move and basically moved himself. He was on his foot a lot and then noticed some drainage from an open area. He has been following with his orthopedic surgeon Dr. Doran Durand. He has been applying a felt donut, dry dressing and using his heel healing sandal. 11/01/15; this is a patient I saw last week for the first time. He had a small open wound on the plantar aspect of his left foot at roughly the level of the base of his fifth metatarsal. He had a considerable degree of thickened skin around this wound on the plantar aspect which I thought was from chronic pressure on this area. He tells [place] that he had drainage over the course of the week. No systemic symptoms. 11/08/15; culture last week grew Citrobacter korseri. This should've been sensitive to the Augmentin I gave him. He has seen Dr. Doran Durand who did his initial surgery and according the patient the plan is to give this another month and then the hardware might need to come out of this. This seems like a reasonable plan. I will adjust his antibiotics to ciprofloxacin which probably should continue for at least another 2 weeks. I gave him 10 days worth today 11/22/15 the patient has completed antibiotics. He has an appointment with Dr. Doran Durand this Friday. There is improved dimensions around the wound on the left fifth metatarsal base 11/29/15; the patient has completed antibiotics last week. Apparently his appointment with Dr. Doran Durand it is not until this Friday. Dimensions are roughly the  same. 12/06/15; saw Dr. Doran Durand. No x-ray told the end of the month, next appointment June 30. We have been using Aquacel Ag 12/13/15: No major change this week. Using Aquacel AG JACOBE, SKURA. ([id number]) 621/17; arrived this week with maceration around the wound. There was quite a bit of undermining which required surgical debridement. I changed him to [facility] last week, by the patient's admission he was up on this more this week Electronic Signature(s) Signed: 12/20/2015 4:37:54 PM By: [name] MD Entered By: [name] on 12/20/2015 16:07:33 [name] ([id number]) -------------------------------------------------------------------------------- Physical Exam Details Patient Name: [name], [name]. Date of Service: 12/20/2015 3:30 PM Medical Record Patient Account Number: [id number] Number: Treating RN: Baruch Gouty, RN, BSN, Rita 02-Oct-1960 [phone] y.o. Other Clinician: Date of Birth/Sex: Male) Treating ROBSON, MICHAEL Primary Care Physician/Extender: Rose Fillers, JEFFREY Physician: Referring Physician: Buddy Duty, JEFFREY Weeks in Treatment: 8 Notes Wound exam; extensive debridement done most of the undermining at healed epithelium around it which was fortunate I did not debridement all of this this will need to be checked next week. Electronic Signature(s) Signed: 12/20/2015 4:37:54 PM By: [name] MD Entered By: [name] on 12/20/2015 16:08:10 [name] ([id number]) -------------------------------------------------------------------------------- Physician Orders Details Patient Name: [name], [name]. Date of Service: 12/20/2015 3:30 PM Medical Record Patient Account Number: [id number] Number: Treating RN: Baruch Gouty, RN, BSN, Rita [birth year] [phone] y.o. Other Clinician: Date of Birth/Sex: Male) Treating ROBSON, MICHAEL Primary Care Physician/Extender: Rose Fillers, JEFFREY Physician: Referring Physician: Buddy Duty, JEFFREY Weeks in Treatment:  8 Verbal / Phone Orders: Yes Clinician: Afful, RN, BSN, Rita Read Back and Verified: Yes Diagnosis Coding ICD-10 Coding Code Description L97.521 Non-pressure chronic ulcer of other part of left foot limited to breakdown of skin E11.621 Type 2 diabetes mellitus with foot ulcer E11.42 Type 2 diabetes mellitus with  diabetic polyneuropathy L03.116 Cellulitis of left lower limb Wound Cleansing Wound #4 Left,Lateral,Plantar Foot o Cleanse wound with mild soap and water o May Shower, gently pat wound dry prior to applying new dressing. o May shower with protection. Anesthetic Wound #4 Left,Lateral,Plantar Foot o Topical Lidocaine 4% cream applied to wound bed prior to debridement Skin Barriers/Peri-Wound Care Wound #4 Left,Lateral,Plantar Foot o Barrier cream o Moisturizing lotion Primary Wound Dressing Wound #4 Left,Lateral,Plantar Foot o Aquacel Ag Secondary Dressing Wound #4 Left,Lateral,Plantar Foot o Gauze and Kerlix/Conform Dressing Change Frequency Wound #4 Left,Lateral,Plantar Foot [name], [name]. ([id number]) o Change dressing every day. Follow-up Appointments Wound #4 Left,Lateral,Plantar Foot o Return Appointment in 1 week. Edema Control Wound #4 Left,Lateral,Plantar Foot o 3 Layer Compression System - Left Lower Extremity Off-Loading Wound #4 Left,Lateral,Plantar Foot o Other: - felt Additional Orders / Instructions Wound #4 Left,Lateral,Plantar Foot o Increase protein intake. o Activity as tolerated Electronic Signature(s) Signed: 12/20/2015 4:37:54 PM By: [name] MD Signed: 12/20/2015 4:58:21 PM By: Regan Lemming BSN, RN Entered By: Regan Lemming on 12/20/2015 16:21:27 [name] ([id number]) -------------------------------------------------------------------------------- Problem List Details Patient Name: [name], [name]. Date of Service: 12/20/2015 3:30 PM Medical Record Patient Account Number:  [id number] Number: Treating RN: Baruch Gouty, RN, BSN, Rita 1960-11-22 [phone] y.o. Other Clinician: Date of Birth/Sex: Male) Treating ROBSON, MICHAEL Primary Care Physician/Extender: Rose Fillers, JEFFREY Physician: Referring Physician: Buddy Duty, JEFFREY Weeks in Treatment: 8 Active Problems ICD-10 Encounter Code Description Active Date Diagnosis L97.521 Non-pressure chronic ulcer of other part of left foot limited 10/25/2015 Yes to breakdown of skin E11.621 Type 2 diabetes mellitus with foot ulcer 10/25/2015 Yes E11.42 Type 2 diabetes mellitus with diabetic polyneuropathy 10/25/2015 Yes L03.116 Cellulitis of left lower limb 11/01/2015 Yes Inactive Problems Resolved Problems Electronic Signature(s) Signed: 12/20/2015 4:37:54 PM By: [name] MD Entered By: [name] on 12/20/2015 16:05:38 [name] ([id number]) -------------------------------------------------------------------------------- Progress Note Details Patient Name: [name]. Date of Service: 12/20/2015 3:30 PM Medical Record Patient Account Number: [id number] Number: Treating RN: Baruch Gouty, RN, BSN, Rita August 29, 1960 [phone] y.o. Other Clinician: Date of Birth/Sex: Male) Treating ROBSON, MICHAEL Primary Care Physician/Extender: Rose Fillers, JEFFREY Physician: Referring Physician: Buddy Duty, JEFFREY Weeks in Treatment: 8 Subjective Chief Complaint Information obtained from Patient Chronic left calf traumatic ulcer (healed). Left third toe ulcer (healed). New right calf ulcer. 10/25/15; patient returns today with a wound over his left lateral foot History of Present Illness (HPI) Pleasant 55 year old with history of diabetes (Hgb A1c 10.8 in 2014) and peripheral neuropathy. No PVD. L ABI 1.1. Status post right great toe partial amputation years ago. He was at work and on 10/22/2014, was injured by a cart, and suffered an ulceration to his left anterior calf. He says that it subsequently became infected, and he  was treated with a course of antibiotics. He was found on initial exam to have an ulceration on the dorsum of his left third toe. He was unaware of this and attributes it to pressure from his steel toed boots. More recently he injured his right anterior calf on a cart. Ambulating normally per his baseline. He has been undergoing regular debridements, applying mupirocin cream, and an Ace wrap for edema control. He returns to clinic for follow-up and is without complaints. No pain. No fever or chills. No drainage. 10/25/15; this is a 55 year old man who has type II diabetes with diabetic polyneuropathy. He tells me that he fractured his left fifth metatarsal in June 2016 when he presented with swelling. He  does not recall a specific injury. His hemoglobin A1c was apparently too high at the time for consideration of surgery and he was put in some form of offloading. Ultimately he went to surgery in December with an allograft from his calcaneus to this site, plate and screws. He had an x-ray of the foot in March that showed concerns about nonunion. He tells me that in March he had to move and basically moved himself. He was on his foot a lot and then noticed some drainage from an open area. He has been following with his orthopedic surgeon Dr. Doran Durand. He has been applying a felt donut, dry dressing and using his heel healing sandal. 11/01/15; this is a patient I saw last week for the first time. He had a small open wound on the plantar aspect of his left foot at roughly the level of the base of his fifth metatarsal. He had a considerable degree of thickened skin around this wound on the plantar aspect which I thought was from chronic pressure on this area. He tells [place] that he had drainage over the course of the week. No systemic symptoms. 11/08/15; culture last week grew Citrobacter korseri. This should've been sensitive to the Augmentin I gave him. He has seen Dr. Doran Durand who did his initial surgery and  according the patient the plan is to give this another month and then the hardware might need to come out of this. This seems like a reasonable plan. I will adjust his antibiotics to ciprofloxacin which probably should continue for at least another 2 weeks. I gave him 10 days worth today [name], [name] ([id number]) 11/22/15 the patient has completed antibiotics. He has an appointment with Dr. Doran Durand this Friday. There is improved dimensions around the wound on the left fifth metatarsal base 11/29/15; the patient has completed antibiotics last week. Apparently his appointment with Dr. Doran Durand it is not until this Friday. Dimensions are roughly the same. 12/06/15; saw Dr. Doran Durand. No x-ray told the end of the month, next appointment June 30. We have been using Aquacel Ag 12/13/15: No major change this week. Using Aquacel AG 621/17; arrived this week with maceration around the wound. There was quite a bit of undermining which required surgical debridement. I changed him to [facility] last week, by the patient's admission he was up on this more this week Objective Constitutional Vitals Time Taken: 3:32 PM, Height: 70 in, Weight: 257 lbs, BMI: 36.9, Temperature: 98.2 F, Pulse: 96 bpm, Respiratory Rate: 17 breaths/min, Blood Pressure: 136/79 mmHg. Integumentary (Hair, Skin) Wound #4 status is Open. Original cause of wound was Pressure Injury. The wound is located on the [place]. The wound measures 1cm length x 1.4cm width x 0.1cm depth; 1.1cm^2 area and 0.11cm^3 volume. The wound is limited to skin breakdown. There is no tunneling or undermining noted. There is a large amount of serosanguineous drainage noted. The wound margin is distinct with the outline attached to the wound base. There is medium (34-66%) pink granulation within the wound bed. There is a small (1-33%) amount of necrotic tissue within the wound bed including Adherent Slough. The periwound skin appearance  exhibited: Callus, Maceration, Moist. The periwound skin appearance did not exhibit: Crepitus, Excoriation, Fluctuance, Friable, Induration, Localized Edema, Rash, Scarring, Dry/Scaly, Atrophie Blanche, Cyanosis, Ecchymosis, Hemosiderin Staining, Mottled, Pallor, Rubor, Erythema. Periwound temperature was noted as No Abnormality. The periwound has tenderness on palpation. Assessment Active Problems ICD-10 L97.521 - Non-pressure chronic ulcer of other part of left foot limited  to breakdown of skin E11.621 - Type 2 diabetes mellitus with foot ulcer E11.42 - Type 2 diabetes mellitus with diabetic polyneuropathy L03.116 - Cellulitis of left lower limb [name], [name]. ([id number]) Procedures Wound #4 Wound #4 is a Diabetic Wound/Ulcer of the Lower Extremity located on the Left,Lateral,Plantar Foot . There was a Skin/Subcutaneous Tissue Debridement [id number]) debridement with total area of 1.4 sq cm performed by Ricard Dillon, MD. with the following instrument(s): Curette to remove Non-Viable tissue/material including Fibrin/Slough, Callus, and Subcutaneous after achieving pain control using Lidocaine 4% Topical Solution. A time out was conducted prior to the start of the procedure. A Moderate amount of bleeding was controlled with Silver Nitrate. The procedure was tolerated well with a pain level of 0 throughout and a pain level of 0 following the procedure. Post Debridement Measurements: 1cm length x 1.4cm width x 0.1cm depth; 0.11cm^3 volume. Post procedure Diagnosis Wound #4: Same as Pre-Procedure Plan #1 I changed him back to Aquacel Ag from [facility] #2 emphasize compliance with offloading Electronic Signature(s) Signed: 12/20/2015 4:37:54 PM By: [name] MD Entered By: [name] on 12/20/2015 16:09:39 [name] ([id number]) -------------------------------------------------------------------------------- SuperBill Details Patient Name: [name]. Date of Service: 12/20/2015 Medical Record Patient Account Number: [id number] Number: Treating RN: Baruch Gouty, RN, BSN, Rita [birth year] [phone] y.o. Other Clinician: Date of Birth/Sex: Male) Treating ROBSON, MICHAEL Primary Care Physician: Buddy Duty, JEFFREY Physician/Extender: G Referring Physician: Buddy Duty, JEFFREY Weeks in Treatment: 8 Diagnosis Coding ICD-10 Codes Code Description L97.521 Non-pressure chronic ulcer of other part of left foot limited to breakdown of skin E11.621 Type 2 diabetes mellitus with foot ulcer E11.42 Type 2 diabetes mellitus with diabetic polyneuropathy L03.116 Cellulitis of left lower limb Facility Procedures CPT4 Code: [id number] Description: [id number] - DEB SUBQ TISSUE 20 SQ CM/< ICD-10 Description Diagnosis E11.621 Type 2 diabetes mellitus with foot ulcer Modifier: Quantity: 1 Physician Procedures CPT4 Code: [id number] Description: [id number] - WC PHYS SUBQ TISS 20 SQ CM ICD-10 Description Diagnosis E11.621 Type 2 diabetes mellitus with foot ulcer Modifier: Quantity: 1 Electronic Signature(s) Signed: 12/20/2015 4:37:54 PM By: [name] MD Entered By: [name] on 12/20/2015 16:10:35

## 2015-12-27 ENCOUNTER — Encounter: Payer: Managed Care, Other (non HMO) | Admitting: Internal Medicine

## 2015-12-27 DIAGNOSIS — E11621 Type 2 diabetes mellitus with foot ulcer: Secondary | ICD-10-CM | POA: Diagnosis not present

## 2015-12-28 NOTE — Progress Notes (Signed)
[name], [name] ([id number]) Visit Report for 12/27/2015 Chief Complaint Document Details Patient Name: [name], [name]. Date of Service: 12/27/2015 3:00 PM Medical Record Patient Account Number: [id number] Number: Treating RN: Baruch Gouty, RN, BSN, Rita 1961-03-21 [phone] y.o. Other Clinician: Date of Birth/Sex: Male) Treating ROBSON, MICHAEL Primary Care Physician/Extender: Rose Fillers, JEFFREY Physician: Referring Physician: Buddy Duty, JEFFREY Weeks in Treatment: 9 Information Obtained from: Patient Chief Complaint Chronic left calf traumatic ulcer (healed). Left third toe ulcer (healed). New right calf ulcer. 10/25/15; patient returns today with a wound over his left lateral foot Electronic Signature(s) Signed: 12/27/2015 4:24:59 PM By: [name] MD Entered By: [name] on 12/27/2015 16:02:08 [name] ([id number]) -------------------------------------------------------------------------------- Debridement Details Patient Name: [name]. Date of Service: 12/27/2015 3:00 PM Medical Record Patient Account Number: [id number] Number: Treating RN: Baruch Gouty, RN, BSN, Rita [birth year] [phone] y.o. Other Clinician: Date of Birth/Sex: Male) Treating ROBSON, MICHAEL Primary Care Physician/Extender: Rose Fillers, JEFFREY Physician: Referring Physician: Buddy Duty, JEFFREY Weeks in Treatment: 9 Debridement Performed for Wound #4 Left,Lateral,Plantar Foot Assessment: Performed By: Physician Ricard Dillon, MD Debridement: Debridement Pre-procedure Yes Verification/Time Out Taken: Start Time: 15:16 Pain Control: Lidocaine 4% Topical Solution Level: Skin/Subcutaneous Tissue Total Area Debrided (L x 1.3 (cm) x 1 (cm) = 1.3 (cm) W): Tissue and other Non-Viable, Callus, Fibrin/Slough material debrided: Instrument: Blade, Curette, Forceps Bleeding: Minimum Hemostasis Achieved: Pressure End Time: 15:22 Procedural Pain: 0 Post Procedural Pain: 0 Response to  Treatment: Procedure was tolerated well Post Debridement Measurements of Total Wound Length: (cm) 1.3 Width: (cm) 1 Depth: (cm) 0.1 Volume: (cm) 0.102 Post Procedure Diagnosis Same as Pre-procedure Electronic Signature(s) Signed: 12/27/2015 4:24:59 PM By: [name] MD Signed: 12/27/2015 5:43:53 PM By: Regan Lemming BSN, RN Entered By: [name] on 12/27/2015 16:01:38 [name] ([id number] Brood, [name] ([id number]) -------------------------------------------------------------------------------- HPI Details Patient Name: [name], [name]. Date of Service: 12/27/2015 3:00 PM Medical Record Patient Account Number: [id number] Number: Treating RN: Baruch Gouty, RN, BSN, Rita 07/13/1960 [phone] y.o. Other Clinician: Date of Birth/Sex: Male) Treating ROBSON, MICHAEL Primary Care Physician/Extender: Rose Fillers, JEFFREY Physician: Referring Physician: Buddy Duty, JEFFREY Weeks in Treatment: 9 History of Present Illness HPI Description: Pleasant 55 year old with history of diabetes (Hgb A1c 10.8 in 2014) and peripheral neuropathy. No PVD. L ABI 1.1. Status post right great toe partial amputation years ago. He was at work and on 10/22/2014, was injured by a cart, and suffered an ulceration to his left anterior calf. He says that it subsequently became infected, and he was treated with a course of antibiotics. He was found on initial exam to have an ulceration on the dorsum of his left third toe. He was unaware of this and attributes it to pressure from his steel toed boots. More recently he injured his right anterior calf on a cart. Ambulating normally per his baseline. He has been undergoing regular debridements, applying mupirocin cream, and an Ace wrap for edema control. He returns to clinic for follow-up and is without complaints. No pain. No fever or chills. No drainage. 10/25/15; this is a 55 year old man who has type II diabetes with diabetic polyneuropathy. He tells me that  he fractured his left fifth metatarsal in June 2016 when he presented with swelling. He does not recall a specific injury. His hemoglobin A1c was apparently too high at the time for consideration of surgery and he was put in some form of offloading. Ultimately he went to surgery in December with an allograft from his calcaneus to  this site, plate and screws. He had an x-ray of the foot in March that showed concerns about nonunion. He tells me that in March he had to move and basically moved himself. He was on his foot a lot and then noticed some drainage from an open area. He has been following with his orthopedic surgeon Dr. Doran Durand. He has been applying a felt donut, dry dressing and using his heel healing sandal. 11/01/15; this is a patient I saw last week for the first time. He had a small open wound on the plantar aspect of his left foot at roughly the level of the base of his fifth metatarsal. He had a considerable degree of thickened skin around this wound on the plantar aspect which I thought was from chronic pressure on this area. He tells [place] that he had drainage over the course of the week. No systemic symptoms. 11/08/15; culture last week grew Citrobacter korseri. This should've been sensitive to the Augmentin I gave him. He has seen Dr. Doran Durand who did his initial surgery and according the patient the plan is to give this another month and then the hardware might need to come out of this. This seems like a reasonable plan. I will adjust his antibiotics to ciprofloxacin which probably should continue for at least another 2 weeks. I gave him 10 days worth today 11/22/15 the patient has completed antibiotics. He has an appointment with Dr. Doran Durand this Friday. There is improved dimensions around the wound on the left fifth metatarsal base 11/29/15; the patient has completed antibiotics last week. Apparently his appointment with Dr. Doran Durand it is not until this Friday. Dimensions are roughly the  same. 12/06/15; saw Dr. Doran Durand. No x-ray told the end of the month, next appointment June 30. We have been using Aquacel Ag 12/13/15: No major change this week. Using Aquacel AG ADGER, OSLUND. ([id number]) 621/17; arrived this week with maceration around the wound. There was quite a bit of undermining which required surgical debridement. I changed him to [facility] last week, by the patient's admission he was up on this more this week X/28/17; macerated tissue around the wound is removed with a scalpel and pickups. There is no undermining. Nonviable subcutaneous tissue and skin taken from the superior circumference of the wound is slough from the surface. Electronic Signature(s) Signed: 12/27/2015 4:24:59 PM By: [name] MD Entered By: [name] on 12/27/2015 16:03:04 [name] ([id number]) -------------------------------------------------------------------------------- Physical Exam Details Patient Name: TAMAR, ZACHMAN. Date of Service: 12/27/2015 3:00 PM Medical Record Patient Account Number: [id number] Number: Treating RN: Baruch Gouty, RN, BSN, Rita [birth year] [phone] y.o. Other Clinician: Date of Birth/Sex: Male) Treating ROBSON, MICHAEL Primary Care Physician/Extender: Rose Fillers, JEFFREY Physician: Referring Physician: Buddy Duty, JEFFREY Weeks in Treatment: 9 Notes Wound exam; debridement done and we will 6 of circumferential callus and skin nonviable subcutaneous tissue the surface of the wound appears healthy Electronic Signature(s) Signed: 12/27/2015 4:24:59 PM By: [name] MD Entered By: [name] on 12/27/2015 16:04:14 [name] ([id number]) -------------------------------------------------------------------------------- Physician Orders Details Patient Name: [name]. Date of Service: 12/27/2015 3:00 PM Medical Record Patient Account Number: [id number] Number: Treating RN: Baruch Gouty, RN, BSN, Rita 1960-10-02 [phone] y.o.  Other Clinician: Date of Birth/Sex: Male) Treating ROBSON, MICHAEL Primary Care Physician/Extender: Rose Fillers, JEFFREY Physician: Referring Physician: Delrae Rend Weeks in Treatment: 9 Verbal / Phone Orders: Yes Clinician: Afful, RN, BSN, Rita Read Back and Verified: Yes Diagnosis Coding Wound Cleansing Wound #4 Left,Lateral,Plantar Foot o  Cleanse wound with mild soap and water o May Shower, gently pat wound dry prior to applying new dressing. o May shower with protection. Anesthetic Wound #4 Left,Lateral,Plantar Foot o Topical Lidocaine 4% cream applied to wound bed prior to debridement Skin Barriers/Peri-Wound Care Wound #4 Left,Lateral,Plantar Foot o Barrier cream o Moisturizing lotion Primary Wound Dressing Wound #4 Left,Lateral,Plantar Foot o Aquacel Ag Secondary Dressing Wound #4 Left,Lateral,Plantar Foot o Gauze and Kerlix/Conform Dressing Change Frequency Wound #4 Left,Lateral,Plantar Foot o Change dressing every day. Follow-up Appointments Wound #4 Left,Lateral,Plantar Foot o Return Appointment in 1 week. Edema Control TRASHON, POWNALL ([id number]) Wound #4 Left,Lateral,Plantar Foot o 3 Layer Compression System - Left Lower Extremity Off-Loading Wound #4 Left,Lateral,Plantar Foot o Other: - felt Additional Orders / Instructions Wound #4 Left,Lateral,Plantar Foot o Increase protein intake. o Activity as tolerated Electronic Signature(s) Signed: 12/27/2015 4:24:59 PM By: [name] MD Signed: 12/27/2015 5:43:53 PM By: Regan Lemming BSN, RN Entered By: Regan Lemming on 12/27/2015 15:34:03 [name] ([id number]) -------------------------------------------------------------------------------- Problem List Details Patient Name: BOSTON, BLAIZE. Date of Service: 12/27/2015 3:00 PM Medical Record Patient Account Number: [id number] Number: Treating RN: Baruch Gouty, RN, BSN, Rita [birth year] [phone] y.o. Other Clinician: Date of  Birth/Sex: Male) Treating ROBSON, MICHAEL Primary Care Physician/Extender: Rose Fillers, JEFFREY Physician: Referring Physician: Buddy Duty, JEFFREY Weeks in Treatment: 9 Active Problems ICD-10 Encounter Code Description Active Date Diagnosis L97.521 Non-pressure chronic ulcer of other part of left foot limited 10/25/2015 Yes to breakdown of skin E11.621 Type 2 diabetes mellitus with foot ulcer 10/25/2015 Yes E11.42 Type 2 diabetes mellitus with diabetic polyneuropathy 10/25/2015 Yes L03.116 Cellulitis of left lower limb 11/01/2015 Yes Inactive Problems Resolved Problems Electronic Signature(s) Signed: 12/27/2015 4:24:59 PM By: [name] MD Entered By: [name] on 12/27/2015 16:01:17 [name] ([id number]) -------------------------------------------------------------------------------- Progress Note Details Patient Name: [name]. Date of Service: 12/27/2015 3:00 PM Medical Record Patient Account Number: [id number] Number: Treating RN: Baruch Gouty, RN, BSN, Rita 1961-05-31 [phone] y.o. Other Clinician: Date of Birth/Sex: Male) Treating ROBSON, MICHAEL Primary Care Physician/Extender: Rose Fillers, JEFFREY Physician: Referring Physician: Buddy Duty, JEFFREY Weeks in Treatment: 9 Subjective Chief Complaint Information obtained from Patient Chronic left calf traumatic ulcer (healed). Left third toe ulcer (healed). New right calf ulcer. 10/25/15; patient returns today with a wound over his left lateral foot History of Present Illness (HPI) Pleasant 55 year old with history of diabetes (Hgb A1c 10.8 in 2014) and peripheral neuropathy. No PVD. L ABI 1.1. Status post right great toe partial amputation years ago. He was at work and on 10/22/2014, was injured by a cart, and suffered an ulceration to his left anterior calf. He says that it subsequently became infected, and he was treated with a course of antibiotics. He was found on initial exam to have an ulceration on the dorsum of  his left third toe. He was unaware of this and attributes it to pressure from his steel toed boots. More recently he injured his right anterior calf on a cart. Ambulating normally per his baseline. He has been undergoing regular debridements, applying mupirocin cream, and an Ace wrap for edema control. He returns to clinic for follow-up and is without complaints. No pain. No fever or chills. No drainage. 10/25/15; this is a 54 year old man who has type II diabetes with diabetic polyneuropathy. He tells me that he fractured his left fifth metatarsal in June 2016 when he presented with swelling. He does not recall a specific injury. His hemoglobin A1c was apparently too high at the  time for consideration of surgery and he was put in some form of offloading. Ultimately he went to surgery in December with an allograft from his calcaneus to this site, plate and screws. He had an x-ray of the foot in March that showed concerns about nonunion. He tells me that in March he had to move and basically moved himself. He was on his foot a lot and then noticed some drainage from an open area. He has been following with his orthopedic surgeon Dr. Doran Durand. He has been applying a felt donut, dry dressing and using his heel healing sandal. 11/01/15; this is a patient I saw last week for the first time. He had a small open wound on the plantar aspect of his left foot at roughly the level of the base of his fifth metatarsal. He had a considerable degree of thickened skin around this wound on the plantar aspect which I thought was from chronic pressure on this area. He tells [place] that he had drainage over the course of the week. No systemic symptoms. 11/08/15; culture last week grew Citrobacter korseri. This should've been sensitive to the Augmentin I gave him. He has seen Dr. Doran Durand who did his initial surgery and according the patient the plan is to give this another month and then the hardware might need to come out of  this. This seems like a reasonable plan. I will adjust his antibiotics to ciprofloxacin which probably should continue for at least another 2 weeks. I gave him 10 days worth today DORAIN, CICOTTE ([id number]) 11/22/15 the patient has completed antibiotics. He has an appointment with Dr. Doran Durand this Friday. There is improved dimensions around the wound on the left fifth metatarsal base 11/29/15; the patient has completed antibiotics last week. Apparently his appointment with Dr. Doran Durand it is not until this Friday. Dimensions are roughly the same. 12/06/15; saw Dr. Doran Durand. No x-ray told the end of the month, next appointment June 30. We have been using Aquacel Ag 12/13/15: No major change this week. Using Aquacel AG 621/17; arrived this week with maceration around the wound. There was quite a bit of undermining which required surgical debridement. I changed him to [facility] last week, by the patient's admission he was up on this more this week X/28/17; macerated tissue around the wound is removed with a scalpel and pickups. There is no undermining. Nonviable subcutaneous tissue and skin taken from the superior circumference of the wound is slough from the surface. Objective Constitutional Vitals Time Taken: 3:04 PM, Height: 70 in, Weight: 257 lbs, BMI: 36.9, Temperature: 98.1 F, Pulse: 93 bpm, Respiratory Rate: 18 breaths/min, Blood Pressure: 149/78 mmHg. Integumentary (Hair, Skin) Wound #4 status is Open. Original cause of wound was Pressure Injury. The wound is located on the [place]. The wound measures 1.3cm length x 1cm width x 0.1cm depth; 1.021cm^2 area and 0.102cm^3 volume. The wound is limited to skin breakdown. There is no tunneling or undermining noted. There is a large amount of serosanguineous drainage noted. The wound margin is distinct with the outline attached to the wound base. There is medium (34-66%) pink granulation within the wound bed. There is a  small (1-33%) amount of necrotic tissue within the wound bed including Adherent Slough. The periwound skin appearance exhibited: Callus, Maceration, Moist. The periwound skin appearance did not exhibit: Crepitus, Excoriation, Fluctuance, Friable, Induration, Localized Edema, Rash, Scarring, Dry/Scaly, Atrophie Blanche, Cyanosis, Ecchymosis, Hemosiderin Staining, Mottled, Pallor, Rubor, Erythema. Periwound temperature was noted as No Abnormality. The periwound  has tenderness on palpation. Assessment Active Problems ICD-10 L97.521 - Non-pressure chronic ulcer of other part of left foot limited to breakdown of skin E11.621 - Type 2 diabetes mellitus with foot ulcer E11.42 - Type 2 diabetes mellitus with diabetic polyneuropathy L03.116 - Cellulitis of left lower limb JETHRO, EADEN. ([id number]) Procedures Wound #4 Wound #4 is a Diabetic Wound/Ulcer of the Lower Extremity located on the Left,Lateral,Plantar Foot . There was a Skin/Subcutaneous Tissue Debridement [id number]) debridement with total area of 1.3 sq cm performed by Ricard Dillon, MD. with the following instrument(s): Blade, Curette, and Forceps to remove Non-Viable tissue/material including Fibrin/Slough and Callus after achieving pain control using Lidocaine 4% Topical Solution. A time out was conducted prior to the start of the procedure. A Minimum amount of bleeding was controlled with Pressure. The procedure was tolerated well with a pain level of 0 throughout and a pain level of 0 following the procedure. Post Debridement Measurements: 1.3cm length x 1cm width x 0.1cm depth; 0.102cm^3 volume. Post procedure Diagnosis Wound #4: Same as Pre-Procedure Plan Wound Cleansing: Wound #4 Left,Lateral,Plantar Foot: Cleanse wound with mild soap and water May Shower, gently pat wound dry prior to applying new dressing. May shower with protection. Anesthetic: Wound #4 Left,Lateral,Plantar Foot: Topical Lidocaine 4% cream applied  to wound bed prior to debridement Skin Barriers/Peri-Wound Care: Wound #4 Left,Lateral,Plantar Foot: Barrier cream Moisturizing lotion Primary Wound Dressing: Wound #4 Left,Lateral,Plantar Foot: Aquacel Ag Secondary Dressing: Wound #4 Left,Lateral,Plantar Foot: Gauze and Kerlix/Conform Dressing Change Frequency: Wound #4 Left,Lateral,Plantar Foot: Change dressing every day. Follow-up Appointments: Wound #4 Left,Lateral,Plantar Foot: Return Appointment in 1 week. DREVAN, ASBILL ([id number]) Edema Control: Wound #4 Left,Lateral,Plantar Foot: 3 Layer Compression System - Left Lower Extremity Off-Loading: Wound #4 Left,Lateral,Plantar Foot: Other: - felt Additional Orders / Instructions: Wound #4 Left,Lateral,Plantar Foot: Increase protein intake. Activity as tolerated Back to Aquacel Ag to help with the drainage Electronic Signature(s) Signed: 12/27/2015 4:24:59 PM By: [name] MD Entered By: [name] on 12/27/2015 16:05:07 [name] ([id number]) -------------------------------------------------------------------------------- SuperBill Details Patient Name: [name]. Date of Service: 12/27/2015 Medical Record Patient Account Number: [id number] Number: Treating RN: Baruch Gouty, RN, BSN, Rita 1960/11/01 [phone] y.o. Other Clinician: Date of Birth/Sex: Male) Treating ROBSON, MICHAEL Primary Care Physician: Buddy Duty, JEFFREY Physician/Extender: G Referring Physician: Buddy Duty, JEFFREY Weeks in Treatment: 9 Diagnosis Coding ICD-10 Codes Code Description L97.521 Non-pressure chronic ulcer of other part of left foot limited to breakdown of skin E11.621 Type 2 diabetes mellitus with foot ulcer E11.42 Type 2 diabetes mellitus with diabetic polyneuropathy L03.116 Cellulitis of left lower limb Facility Procedures CPT4 Code: [id number] Description: [id number] - DEB SUBQ TISSUE 20 SQ CM/< ICD-10 Description Diagnosis E11.621 Type 2 diabetes mellitus with foot  ulcer Modifier: Quantity: 1 Physician Procedures CPT4 Code: [id number] Description: [id number] - WC PHYS SUBQ TISS 20 SQ CM ICD-10 Description Diagnosis E11.621 Type 2 diabetes mellitus with foot ulcer Modifier: Quantity: 1 Electronic Signature(s) Signed: 12/27/2015 4:24:59 PM By: [name] MD Entered By: [name] on 12/27/2015 16:05:42

## 2015-12-28 NOTE — Progress Notes (Addendum)
[name], [name] ([id number]) Visit Report for 12/27/2015 Arrival Information Details Patient Name: [name], [name]. Date of Service: 12/27/2015 3:00 PM Medical Record Patient Account Number: [id number] Number: Treating RN: Baruch Gouty, RN, BSN, Rita Sep 12, 1960 [phone] y.o. Other Clinician: Date of Birth/Sex: Male) Treating ROBSON, MICHAEL Primary Care Physician: Buddy Duty, JEFFREY Physician/Extender: G Referring Physician: Buddy Duty, JEFFREY Weeks in Treatment: 9 Visit Information History Since Last Visit Added or deleted any medications: No Patient Arrived: Ambulatory Any new allergies or adverse reactions: No Arrival Time: 15:00 Had a fall or experienced change in No Accompanied By: self activities of daily living that may affect Transfer Assistance: None risk of falls: Patient Identification Verified: Yes Signs or symptoms of abuse/neglect since last No Secondary Verification Process Yes visito Completed: Hospitalized since last visit: No Patient Requires Transmission-Based No Has Dressing in Place as Prescribed: Yes Precautions: Has Compression in Place as Prescribed: Yes Patient Has Alerts: No Pain Present Now: No Electronic Signature(s) Signed: 12/27/2015 3:00:37 PM By: Regan Lemming BSN, RN Entered By: Regan Lemming on 12/27/2015 15:00:37 [name] ([id number]) -------------------------------------------------------------------------------- Encounter Discharge Information Details Patient Name: [name]. Date of Service: 12/27/2015 3:00 PM Medical Record Patient Account Number: [id number] Number: Treating RN: Baruch Gouty, RN, BSN, Rita 10/28/1960 [phone] y.o. Other Clinician: Date of Birth/Sex: Male) Treating ROBSON, MICHAEL Primary Care Physician: Buddy Duty, JEFFREY Physician/Extender: G Referring Physician: Buddy Duty, JEFFREY Weeks in Treatment: 9 Encounter Discharge Information Items Discharge Pain Level: 0 Discharge Condition: Stable Ambulatory Status:  Ambulatory Discharge Destination: Home Transportation: Private Auto Accompanied By: self Schedule Follow-up Appointment: No Medication Reconciliation completed and provided to Patient/Care No [name]: Provided on Clinical Summary of Care: 12/27/2015 Form Type Recipient Paper Patient HP Electronic Signature(s) Signed: 12/27/2015 5:24:38 PM By: Regan Lemming BSN, RN Previous Signature: 12/27/2015 3:34:31 PM Version By: Ruthine Dose Entered By: Regan Lemming on 12/27/2015 17:24:38 [name] ([id number]) -------------------------------------------------------------------------------- Lower Extremity Assessment Details Patient Name: [name]. Date of Service: 12/27/2015 3:00 PM Medical Record Patient Account Number: [id number] Number: Treating RN: Baruch Gouty, RN, BSN, Rita Oct 21, 1960 [phone] y.o. Other Clinician: Date of Birth/Sex: Male) Treating ROBSON, [place] Primary Care Physician: Buddy Duty, JEFFREY Physician/Extender: G Referring Physician: Buddy Duty, JEFFREY Weeks in Treatment: 9 Edema Assessment Assessed: [Left: No] [Right: No] E[Left: dema] [Right: :] Calf Left: Right: Point of Measurement: 36 cm From Medial Instep cm cm Ankle Left: Right: Point of Measurement: 10 cm From Medial Instep cm cm Vascular Assessment Pulses: Posterior Tibial Dorsalis Pedis Palpable: [Left:Yes] Extremity colors, hair growth, and conditions: Extremity Color: [Left:Dusky] Hair Growth on Extremity: [Left:No] Temperature of Extremity: [Left:Warm] Capillary Refill: [Left:< 3 seconds] Toe Nail Assessment Left: Right: Thick: Yes Discolored: Yes Deformed: No Improper Length and Hygiene: No Electronic Signature(s) Signed: 12/27/2015 3:01:09 PM By: Regan Lemming BSN, RN Entered By: Regan Lemming on 12/27/2015 15:01:09 [name] ([id number] Brood, [name] ([id number]) -------------------------------------------------------------------------------- Multi Wound Chart Details Patient  Name: [name]. Date of Service: 12/27/2015 3:00 PM Medical Record Patient Account Number: [id number] Number: Treating RN: Baruch Gouty, RN, BSN, Rita Jul 13, 1960 [phone] y.o. 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] Comorbid History: [4:Hypertension, Type  II Diabetes, Neuropathy] [N/A:N/A] Date Acquired: [4:07/12/2015] [N/A:N/A] Weeks of Treatment: [4:9] [N/A:N/A] Wound Status: [4:Open] [N/A:N/A] Measurements L x W x D 1.3x1x0.1 [N/A:N/A] (cm) Area (cm) : [4:1.021] [N/A:N/A] Volume (cm) : [4:0.102] [N/A:N/A] % Reduction in Area: [4:-30.10%] [N/A:N/A] % Reduction in Volume: 56.80% [N/A:N/A] Classification: [4:Grade 1] [N/A:N/A] Exudate Amount: [4:Large] [N/A:N/A] Exudate Type: [4:Serosanguineous] [N/A:N/A] Exudate Color: [4:red, brown] [N/A:N/A] Foul Odor After [4:Yes] [N/A:N/A] Cleansing: Odor Anticipated Due to No [N/A:N/A] Product Use: Wound Margin: [4:Distinct, outline attached N/A] Granulation Amount: [4:Medium (34-66%)] [N/A:N/A] Granulation Quality: [4:Pink] [N/A:N/A] Necrotic Amount: [4:Small (1-33%)] [N/A:N/A] Exposed Structures: Fascia: No N/A N/A Fat: No Tendon: No Muscle: No Joint: No Bone: No Limited to Skin Breakdown Epithelialization: None N/A N/A Periwound Skin Texture: Callus: Yes N/A N/A Edema: No Excoriation: No Induration: No Crepitus: No Fluctuance: No Friable: No Rash: No Scarring: No Periwound Skin Maceration: Yes  N/A N/A Moisture: Moist: Yes Dry/Scaly: No Periwound Skin Color: Atrophie Blanche: No N/A N/A Cyanosis: No Ecchymosis: No Erythema: No Hemosiderin Staining: No Mottled: No Pallor: No Rubor: No Temperature: No Abnormality N/A N/A Tenderness on Yes N/A N/A Palpation: Wound Preparation: Ulcer Cleansing: N/A N/A Rinsed/Irrigated with Saline Topical Anesthetic Applied: Other: lidocaine 4% Treatment Notes Electronic Signature(s) Signed: 12/27/2015 5:43:53 PM By: Regan Lemming BSN, RN Entered By: Regan Lemming on 12/27/2015 15:18:20 [name] ([id number]) -------------------------------------------------------------------------------- [place] Details Patient Name: [name], [name]. Date of Service: 12/27/2015 3:00 PM Medical Record Patient Account Number: [id number] Number: Treating RN: Baruch Gouty, RN, BSN, Rita [birth year] [phone] y.o. Other Clinician: Date of Birth/Sex: Male) Treating ROBSON, MICHAEL Primary Care Physician: Buddy Duty, JEFFREY Physician/Extender: G Referring Physician: Delrae Rend Weeks in Treatment: 9 Active Inactive Electronic Signature(s) Signed: 01/15/2016 4:43:54 PM By: Gretta Cool RN, BSN, Kim RN, BSN Signed: 02/27/2016 3:27:27 PM By: Regan Lemming BSN, RN Previous Signature: 12/27/2015 5:43:53 PM Version By: Regan Lemming BSN, RN Entered By: Gretta Cool, RN, BSN, Kim on 01/11/2016 13:03:10 [name] ([id number]) -------------------------------------------------------------------------------- Pain Assessment Details Patient Name: [name], [name]. Date of Service: 12/27/2015 3:00 PM Medical Record Patient Account Number: [id number] Number: Treating RN: Baruch Gouty, RN, BSN, Rita Nov 21, 1960 [phone] y.o. Other Clinician: Date of Birth/Sex: Male) Treating ROBSON, MICHAEL Primary Care Physician: Buddy Duty, JEFFREY Physician/Extender: G Referring Physician: Buddy Duty, JEFFREY Weeks in Treatment: 9 Active Problems Location of Pain Severity and  Description of Pain Patient Has Paino No Site Locations With Dressing Change: No Pain Management and Medication Current Pain Management: Electronic Signature(s) Signed: 12/27/2015 3:00:44 PM By: Regan Lemming BSN, RN Entered By: Regan Lemming on 12/27/2015 15:00:43 [name] ([id number]) -------------------------------------------------------------------------------- Patient/Caregiver Education Details Patient Name: [name]. Date of Service: 12/27/2015 3:00 PM Medical Record Patient Account Number: [id number] Number: Treating RN: Baruch Gouty, RN, BSN, Rita Jun 06, 1961 [phone] y.o. Other Clinician: Date of Birth/Gender: Male) Treating ROBSON, [facility] Primary Care Physician: Buddy Duty, JEFFREY Physician/Extender: G Referring Physician: Mackey Birchwood in Treatment: 9 Education Assessment Education Provided To: Patient Education Topics Provided Offloading: Methods: Explain/Verbal Responses: State content correctly Peripheral Neuropathy: Methods: Explain/Verbal Responses: State content correctly Welcome To [facility]: Methods: Explain/Verbal Responses: State content correctly Wound/Skin Impairment: Methods: Explain/Verbal Responses: State content correctly Electronic Signature(s) Signed: 12/27/2015 5:43:53 PM By: Regan Lemming BSN, RN Entered By: Regan Lemming on 12/27/2015 17:25:04 [name] ([id number]) -------------------------------------------------------------------------------- Wound Assessment Details Patient Name: [name]. Date of Service: 12/27/2015 3:00 PM Medical Record Patient Account Number: [id number] Number: Treating RN: Baruch Gouty, RN, BSN, Rita June 11, 1961 [phone] y.o. Other Clinician: Date of  Birth/Sex: Male) Treating ROBSON, MICHAEL Primary Care Physician: Buddy Duty, JEFFREY Physician/Extender: G Referring Physician: Buddy Duty, JEFFREY Weeks in Treatment: 9 Wound Status Wound Number: 4 Primary Diabetic Wound/Ulcer of the  Lower Etiology: Extremity Wound Location: Left Foot - Plantar, Lateral Wound Status: Open Wounding Event: Pressure Injury Comorbid Hypertension, Type II Diabetes, Date Acquired: 07/12/2015 History: Neuropathy Weeks Of Treatment: 9 Clustered Wound: No Photos Photo Uploaded By: Regan Lemming on 12/27/2015 17:30:46 Wound Measurements Length: (cm) 1.3 Width: (cm) 1 Depth: (cm) 0.1 Area: (cm) 1.021 Volume: (cm) 0.102 % Reduction in Area: -30.1% % Reduction in Volume: 56.8% Epithelialization: None Tunneling: No Undermining: No Wound Description Classification: Grade 1 Wound Margin: Distinct, outline attached Exudate Amount: Large ANTHANY, POLIT ([id number]) Foul Odor After Cleansing: Yes Due to Product Use: No Exudate Type: Serosanguineous Exudate Color: red, brown Wound Bed Granulation Amount: Medium (34-66%) Exposed Structure Granulation Quality: Pink Fascia Exposed: No Necrotic Amount: Small (1-33%) Fat Layer Exposed: No Necrotic Quality: Adherent Slough Tendon Exposed: No Muscle Exposed: No Joint Exposed: No Bone Exposed: No Limited to Skin Breakdown Periwound Skin Texture Texture Color No Abnormalities Noted: No No Abnormalities Noted: No Callus: Yes Atrophie Blanche: No Crepitus: No Cyanosis: No Excoriation: No Ecchymosis: No Fluctuance: No Erythema: No Friable: No Hemosiderin Staining: No Induration: No Mottled: No Localized Edema: No Pallor: No Rash: No Rubor: No Scarring: No Temperature / Pain Moisture Temperature: No Abnormality No Abnormalities Noted: No Tenderness on Palpation: Yes Dry / Scaly: No Maceration: Yes Moist: Yes Wound Preparation Ulcer Cleansing: Rinsed/Irrigated with Saline Topical Anesthetic Applied: Other: lidocaine 4%, Electronic Signature(s) Signed: 12/27/2015 5:43:53 PM By: Regan Lemming BSN, RN Entered By: Regan Lemming on 12/27/2015 15:15:26 [name]  ([id number]) -------------------------------------------------------------------------------- Vitals Details Patient Name: [name]. Date of Service: 12/27/2015 3:00 PM Medical Record Patient Account Number: [id number] Number: Treating RN: Baruch Gouty, RN, BSN, Rita 08/11/1960 [phone] y.o. Other Clinician: Date of Birth/Sex: Male) Treating ROBSON, Rafael Hernandez Primary Care Physician: Buddy Duty, JEFFREY Physician/Extender: G Referring Physician: Buddy Duty, JEFFREY Weeks in Treatment: 9 Vital Signs Time Taken: 15:04 Temperature (F): 98.1 Height (in): 70 Pulse (bpm): 93 Weight (lbs): 257 Respiratory Rate (breaths/min): 18 Body Mass Index (BMI): 36.9 Blood Pressure (mmHg): 149/78 Reference Range: 80 - 120 mg / dl Electronic Signature(s) Signed: 12/27/2015 5:43:53 PM By: Regan Lemming BSN, RN Entered By: Regan Lemming on 12/27/2015 15:05:33

## 2015-12-29 ENCOUNTER — Other Ambulatory Visit: Payer: Self-pay | Admitting: Orthopedic Surgery

## 2015-12-29 DIAGNOSIS — L97421 Non-pressure chronic ulcer of left heel and midfoot limited to breakdown of skin: Secondary | ICD-10-CM

## 2015-12-29 DIAGNOSIS — E11621 Type 2 diabetes mellitus with foot ulcer: Secondary | ICD-10-CM

## 2015-12-30 NOTE — Progress Notes (Signed)
ANSON, MIRACLE ([id number]) Visit Report for 12/29/2015 Arrival Information Details Patient Name: [name], [name]. Date of Service: 12/29/2015 1:30 PM Medical Record Number: [id number] Patient Account Number: [id number] Date of Birth/Sex: Nov 11, 1960 (55 y.o. Male) Treating RN: Cornell Barman Primary Care Physician: Buddy Duty, JEFFREY Other Clinician: Referring Physician: Buddy Duty, JEFFREY Treating Physician/Extender: Frann Rider in Treatment: 9 Visit Information History Since Last Visit Added or deleted any medications: No Patient Arrived: Ambulatory Any new allergies or adverse reactions: No Arrival Time: 13:39 Had a fall or experienced change in No Accompanied By: SELF activities of daily living that may affect Transfer Assistance: None risk of falls: Patient Identification Verified: Yes Signs or symptoms of abuse/neglect since last No Secondary Verification Process Yes visito Completed: Hospitalized since last visit: No Patient Requires Transmission-Based No Pain Present Now: No Precautions: Patient Has Alerts: No Electronic Signature(s) Signed: 12/29/2015 4:25:19 PM By: Gretta Cool, RN, BSN, Kim RN, BSN Entered By: Gretta Cool, RN, BSN, Kim on 12/29/2015 13:56:31 [name] ([id number]) -------------------------------------------------------------------------------- Encounter Discharge Information Details Patient Name: [name]. Date of Service: 12/29/2015 1:30 PM Medical Record Number: [id number] Patient Account Number: [id number] Date of Birth/Sex: [birth year] (55 y.o. Male) Treating RN: Cornell Barman Primary Care Physician: Buddy Duty, JEFFREY Other Clinician: Referring Physician: Buddy Duty, JEFFREY Treating Physician/Extender: Frann Rider in Treatment: 9 Encounter Discharge Information Items Discharge Pain Level: 0 Discharge Condition: Stable Ambulatory Status: Ambulatory Discharge Destination: Home Private Transportation: Auto Accompanied By: self Schedule Follow-up  Appointment: Yes Medication Reconciliation completed and Yes provided to Patient/Care Lenvil Swaim: Clinical Summary of Care: Electronic Signature(s) Signed: 12/29/2015 4:25:19 PM By: Gretta Cool, RN, BSN, Kim RN, BSN Entered By: Gretta Cool, RN, BSN, Kim on 12/29/2015 13:58:23 [name] ([id number]) -------------------------------------------------------------------------------- Patient/Caregiver Education Details Patient Name: [name]. Date of Service: 12/29/2015 1:30 PM Medical Record Number: [id number] Patient Account Number: [id number] Date of Birth/Gender: Oct 05, 1960 (55 y.o. Male) Treating RN: Cornell Barman Primary Care Physician: Buddy Duty, JEFFREY Other Clinician: Referring Physician: Delrae Rend Treating Physician/Extender: Frann Rider in Treatment: 9 Education Assessment Education Provided To: Patient Education Topics Provided Tissue Oxygenation: Venous: Handouts: Controlling Swelling with Multilayered Compression Wraps Methods: Demonstration, Explain/Verbal Responses: State content correctly Wound/Skin Impairment: Electronic Signature(s) Signed: 12/29/2015 4:25:19 PM By: Gretta Cool, RN, BSN, Kim RN, BSN Entered By: Gretta Cool, RN, BSN, Kim on 12/29/2015 13:58:10 [name] ([id number]) -------------------------------------------------------------------------------- Wound Assessment Details Patient Name: [name]. Date of Service: 12/29/2015 1:30 PM Medical Record Number: [id number] Patient Account Number: [id number] Date of Birth/Sex: 08-22-1960 (55 y.o. Male) Treating RN: Cornell Barman Primary Care Physician: Buddy Duty, JEFFREY Other Clinician: Referring Physician: Buddy Duty, JEFFREY Treating Physician/Extender: Frann Rider in Treatment: 9 Wound Status Wound Number: 4 Primary Diabetic Wound/Ulcer of the Lower Etiology: Extremity Wound Location: Left Foot - Plantar, Lateral Wound Status: Open Wounding Event: Pressure Injury Comorbid Hypertension, Type II  Diabetes, Date Acquired: 07/12/2015 History: Neuropathy Weeks Of Treatment: 9 Clustered Wound: No Wound Measurements Length: (cm) 1.3 Width: (cm) 1 Depth: (cm) 0.3 Area: (cm) 1.021 Volume: (cm) 0.306 % Reduction in Area: -30.1% % Reduction in Volume: -29.7% Epithelialization: None Tunneling: No Undermining: No Wound Description Classification: Grade 1 Foul Odor Af Wound Margin: Distinct, outline attached Due to Produ Exudate Amount: Large Exudate Type: Serosanguineous Exudate Color: red, brown ter Cleansing: Yes ct Use: No Wound Bed Granulation Amount: Medium (34-66%) Exposed Structure Granulation Quality: Pink Fascia Exposed: No Necrotic Amount: Small (1-33%) Fat Layer Exposed: No Necrotic Quality: Adherent Slough Tendon Exposed: No Muscle Exposed: No Joint Exposed: No Bone Exposed: No  Limited to Skin Breakdown Periwound Skin Texture Texture Color No Abnormalities Noted: No No Abnormalities Noted: No Callus: Yes Atrophie Blanche: No Crepitus: No Cyanosis: No Excoriation: No Ecchymosis: No Fluctuance: No Erythema: No [name]. ([id number]) Friable: No Hemosiderin Staining: No Induration: No Mottled: No Localized Edema: No Pallor: No Rash: No Rubor: No Scarring: No Temperature / Pain Moisture Temperature: No Abnormality No Abnormalities Noted: No Tenderness on Palpation: Yes Dry / Scaly: No Maceration: No Moist: Yes Wound Preparation Ulcer Cleansing: Rinsed/Irrigated with Saline Topical Anesthetic Applied: None Treatment Notes Wound #4 (Left, Lateral, Plantar Foot) 1. Cleansed with: Clean wound with Normal Saline 4. Dressing Applied: Aquacel Ag 5. Secondary Dressing Applied ABD Pad 7. Secured with 3 Layer Compression System - Left Lower Extremity Notes felt Electronic Signature(s) Signed: 12/29/2015 4:25:19 PM By: Gretta Cool, RN, BSN, Kim RN, BSN Entered By: Gretta Cool, RN, BSN, Kim on 12/29/2015 13:57:07

## 2015-12-30 NOTE — Progress Notes (Signed)
[name], [name] ([id number]) Visit Report for 12/29/2015 Physician Orders Details Patient Name: [name], [name]. Date of Service: 12/29/2015 1:30 PM Medical Record Number: [id number] Patient Account Number: [id number] Date of Birth/Sex: Mar 04, 1961 (55 y.o. Male) Treating RN: Cornell Barman Primary Care Physician: Delrae Rend Other Clinician: Referring Physician: Buddy Duty, JEFFREY Treating Physician/Extender: Frann Rider in Treatment: 9 Verbal / Phone Orders: Yes Clinician: Cornell Barman Read Back and Verified: Yes Diagnosis Coding Wound Cleansing Wound #4 Left,Lateral,Plantar Foot o Cleanse wound with mild soap and water o May Shower, gently pat wound dry prior to applying new dressing. o May shower with protection. Anesthetic Wound #4 Left,Lateral,Plantar Foot o Topical Lidocaine 4% cream applied to wound bed prior to debridement Skin Barriers/Peri-Wound Care Wound #4 Left,Lateral,Plantar Foot o Barrier cream o Moisturizing lotion Primary Wound Dressing Wound #4 Left,Lateral,Plantar Foot o Aquacel Ag Secondary Dressing Wound #4 Left,Lateral,Plantar Foot o Gauze and Kerlix/Conform Dressing Change Frequency Wound #4 Left,Lateral,Plantar Foot o Change dressing every day. Follow-up Appointments Wound #4 Left,Lateral,Plantar Foot o Return Appointment in 1 week. o Nurse Visit as needed - rewrap following ortho visit. 6/30 [name], [name]. ([id number]) Edema Control Wound #4 Left,Lateral,Plantar Foot o 3 Layer Compression System - Left Lower Extremity Off-Loading Wound #4 Left,Lateral,Plantar Foot o Other: - felt Additional Orders / Instructions Wound #4 Left,Lateral,Plantar Foot o Increase protein intake. o Activity as tolerated Electronic Signature(s) Signed: 12/29/2015 4:25:19 PM By: Gretta Cool RN, BSN, Kim RN, BSN Signed: 12/29/2015 4:47:37 PM By: Christin Fudge MD, FACS Entered By: Gretta Cool RN, BSN, Kim on 12/29/2015 13:59:21 [name]  ([id number]) -------------------------------------------------------------------------------- [place] Details Patient Name: [name], [name]. Date of Service: 12/29/2015 Medical Record Number: [id number] Patient Account Number: [id number] Date of Birth/Sex: Apr 06, 1961 (55 y.o. Male) Treating RN: Cornell Barman Primary Care Physician: Buddy Duty, JEFFREY Other Clinician: Referring Physician: Buddy Duty, JEFFREY Treating Physician/Extender: Frann Rider in Treatment: 9 Diagnosis Coding ICD-10 Codes Code Description [phone] Non-pressure chronic ulcer of other part of left foot limited to breakdown of skin E11.621 Type 2 diabetes mellitus with foot ulcer E11.42 Type 2 diabetes mellitus with diabetic polyneuropathy L03.116 Cellulitis of left lower limb Facility Procedures CPT4: Description Modifier Quantity Code [id number] (Facility Use Only) [id number] - APPLY MULTLAY COMPRS LWR LT 1 LEG Electronic Signature(s) Signed: 12/29/2015 4:25:19 PM By: Gretta Cool RN, BSN, Kim RN, BSN Signed: 12/29/2015 4:47:37 PM By: Christin Fudge MD, FACS Entered By: Gretta Cool, RN, BSN, Kim on 12/29/2015 14:00:03

## 2016-01-03 ENCOUNTER — Ambulatory Visit: Payer: Self-pay | Admitting: Internal Medicine

## 2016-01-04 NOTE — Progress Notes (Signed)
BAIRD, HOLMON ([id number]) Visit Report for 12/13/2015 Chief Complaint Document Details Patient Name: [name], [name]. Date of Service: 12/13/2015 12:45 PM Medical Record Patient Account Number: [id number] Number: Treating RN: Baruch Gouty, RN, BSN, Rita 1961-04-03 [phone] y.o. Other Clinician: Date of Birth/Sex: Male) Treating Kazimierz Springborn Primary Care Physician/Extender: Rose Fillers, JEFFREY Physician: Referring Physician: Buddy Duty, JEFFREY Weeks in Treatment: 7 Information Obtained from: Patient Chief Complaint Chronic left calf traumatic ulcer (healed). Left third toe ulcer (healed). New right calf ulcer. 10/25/15; patient returns today with a wound over his left lateral foot Electronic Signature(s) Signed: 01/04/2016 7:33:08 AM By: [name] MD Entered By: [name] on 12/13/2015 13:34:06 [name] ([id number]) -------------------------------------------------------------------------------- Debridement Details Patient Name: [name]. Date of Service: 12/13/2015 12:45 PM Medical Record Patient Account Number: [id number] Number: Treating RN: Baruch Gouty, RN, BSN, Rita 10/21/1960 [phone] y.o. Other Clinician: Date of Birth/Sex: Male) Treating Shantice Menger Primary Care Physician/Extender: Rose Fillers, JEFFREY Physician: Referring Physician: Buddy Duty, JEFFREY Weeks in Treatment: 7 Debridement Performed for Wound #4 Left,Lateral,Plantar Foot Assessment: Performed By: Physician Ricard Dillon, MD Debridement: Debridement Pre-procedure Yes Verification/Time Out Taken: Start Time: 13:27 Pain Control: Lidocaine 4% Topical Solution Level: Skin/Subcutaneous Tissue Total Area Debrided (L x 1 (cm) x 1.5 (cm) = 1.5 (cm) W): Tissue and other Non-Viable, Callus, Fibrin/Slough, Subcutaneous material debrided: Instrument: Curette Bleeding: Minimum Hemostasis Achieved: Pressure End Time: 13:30 Procedural Pain: 0 Post Procedural Pain: 0 Response to Treatment:  Procedure was tolerated well Post Debridement Measurements of Total Wound Length: (cm) 1 Width: (cm) 1.5 Depth: (cm) 0.1 Volume: (cm) 0.118 Post Procedure Diagnosis Same as Pre-procedure Electronic Signature(s) Signed: 12/13/2015 5:17:42 PM By: Regan Lemming BSN, RN Signed: 01/04/2016 7:33:08 AM By: [name] MD Entered By: Regan Lemming on 12/13/2015 13:30:10 [name] ([id number] Brood, [name] ([id number]) -------------------------------------------------------------------------------- HPI Details Patient Name: [name], [name]. Date of Service: 12/13/2015 12:45 PM Medical Record Patient Account Number: [id number] Number: Treating RN: Baruch Gouty, RN, BSN, Rita 08-04-1960 [phone] y.o. Other Clinician: Date of Birth/Sex: Male) Treating Zitlali Primm Primary Care Physician/Extender: Rose Fillers, JEFFREY Physician: Referring Physician: Buddy Duty, JEFFREY Weeks in Treatment: 7 History of Present Illness HPI Description: Pleasant 55 year old with history of diabetes (Hgb A1c 10.8 in 2014) and peripheral neuropathy. No PVD. L ABI 1.1. Status post right great toe partial amputation years ago. He was at work and on 10/22/2014, was injured by a cart, and suffered an ulceration to his left anterior calf. He says that it subsequently became infected, and he was treated with a course of antibiotics. He was found on initial exam to have an ulceration on the dorsum of his left third toe. He was unaware of this and attributes it to pressure from his steel toed boots. More recently he injured his right anterior calf on a cart. Ambulating normally per his baseline. He has been undergoing regular debridements, applying mupirocin cream, and an Ace wrap for edema control. He returns to clinic for follow-up and is without complaints. No pain. No fever or chills. No drainage. 10/25/15; this is a 55 year old man who has type II diabetes with diabetic polyneuropathy. He tells me that he fractured  his left fifth metatarsal in June 2016 when he presented with swelling. He does not recall a specific injury. His hemoglobin A1c was apparently too high at the time for consideration of surgery and he was put in some form of offloading. Ultimately he went to surgery in December with an allograft from his calcaneus to this  site, plate and screws. He had an x-ray of the foot in March that showed concerns about nonunion. He tells me that in March he had to move and basically moved himself. He was on his foot a lot and then noticed some drainage from an open area. He has been following with his orthopedic surgeon Dr. Doran Durand. He has been applying a felt donut, dry dressing and using his heel healing sandal. 11/01/15; this is a patient I saw last week for the first time. He had a small open wound on the plantar aspect of his left foot at roughly the level of the base of his fifth metatarsal. He had a considerable degree of thickened skin around this wound on the plantar aspect which I thought was from chronic pressure on this area. He tells [place] that he had drainage over the course of the week. No systemic symptoms. 11/08/15; culture last week grew Citrobacter korseri. This should've been sensitive to the Augmentin I gave him. He has seen Dr. Doran Durand who did his initial surgery and according the patient the plan is to give this another month and then the hardware might need to come out of this. This seems like a reasonable plan. I will adjust his antibiotics to ciprofloxacin which probably should continue for at least another 2 weeks. I gave him 10 days worth today 11/22/15 the patient has completed antibiotics. He has an appointment with Dr. Doran Durand this Friday. There is improved dimensions around the wound on the left fifth metatarsal base 11/29/15; the patient has completed antibiotics last week. Apparently his appointment with Dr. Doran Durand it is not until this Friday. Dimensions are roughly the same. 12/06/15;  saw Dr. Doran Durand. No x-ray told the end of the month, next appointment June 30. We have been using Aquacel Ag 12/13/15: No major change this week. Using Aquacel AG [name], [name] ([id number]) Electronic Signature(s) Signed: 01/04/2016 7:33:08 AM By: [name] MD Entered By: [name] on 12/13/2015 13:35:54 [name] ([id number]) -------------------------------------------------------------------------------- Physical Exam Details Patient Name: [name], [name]. Date of Service: 12/13/2015 12:45 PM Medical Record Patient Account Number: [id number] Number: Treating RN: Baruch Gouty, RN, BSN, Rita Sep 11, 1960 [phone] y.o. Other Clinician: Date of Birth/Sex: Male) Treating Joyanne Eddinger Primary Care Physician/Extender: Rose Fillers, JEFFREY Physician: Referring Physician: Buddy Duty, JEFFREY Weeks in Treatment: 7 Notes Wound Exam: no change from last week. Debridement as noted Electronic Signature(s) Signed: 01/04/2016 7:33:08 AM By: [name] MD Entered By: [name] on 12/13/2015 13:36:41 [name] ([id number]) -------------------------------------------------------------------------------- Physician Orders Details Patient Name: [name]. Date of Service: 12/13/2015 12:45 PM Medical Record Patient Account Number: [id number] Number: Treating RN: Baruch Gouty, RN, BSN, Rita Oct 10, 1960 [phone] y.o. Other Clinician: Date of Birth/Sex: Male) Treating Reham Slabaugh Primary Care Physician/Extender: Rose Fillers, JEFFREY Physician: Referring Physician: Mackey Birchwood in Treatment: 7 Verbal / Phone Orders: Yes Clinician: Afful, RN, BSN, Rita Read Back and Verified: Yes Diagnosis Coding Wound Cleansing Wound #4 Left,Lateral,Plantar Foot o Cleanse wound with mild soap and water o May Shower, gently pat wound dry prior to applying new dressing. o May shower with protection. Anesthetic Wound #4 Left,Lateral,Plantar Foot o Topical Lidocaine 4%  cream applied to wound bed prior to debridement Primary Wound Dressing Wound #4 Left,Lateral,Plantar Foot o Other: - RTD Secondary Dressing Wound #4 Left,Lateral,Plantar Foot o Gauze and Kerlix/Conform Dressing Change Frequency Wound #4 Left,Lateral,Plantar Foot o Change dressing every day. Follow-up Appointments Wound #4 Left,Lateral,Plantar Foot o Return Appointment in 1 week. Off-Loading Wound #4  Left,Lateral,Plantar Foot o Other: - felt Additional Orders / Instructions Wound #4 Left,Lateral,Plantar Foot [name], [name]. ([id number]) o Increase protein intake. o Activity as tolerated Electronic Signature(s) Signed: 12/13/2015 5:17:42 PM By: Regan Lemming BSN, RN Signed: 01/04/2016 7:33:08 AM By: [name] MD Entered By: Regan Lemming on 12/13/2015 13:30:39 [name] ([id number]) -------------------------------------------------------------------------------- Problem List Details Patient Name: [name], [name]. Date of Service: 12/13/2015 12:45 PM Medical Record Patient Account Number: [id number] Number: Treating RN: Baruch Gouty, RN, BSN, Rita March 29, 1961 [phone] y.o. Other Clinician: Date of Birth/Sex: Male) Treating Tarig Zimmers Primary Care Physician/Extender: Rose Fillers, JEFFREY Physician: Referring Physician: Buddy Duty, JEFFREY Weeks in Treatment: 7 Active Problems ICD-10 Encounter Code Description Active Date Diagnosis L97.521 Non-pressure chronic ulcer of other part of left foot limited 10/25/2015 Yes to breakdown of skin E11.621 Type 2 diabetes mellitus with foot ulcer 10/25/2015 Yes E11.42 Type 2 diabetes mellitus with diabetic polyneuropathy 10/25/2015 Yes L03.116 Cellulitis of left lower limb 11/01/2015 Yes Inactive Problems Resolved Problems Electronic Signature(s) Signed: 01/04/2016 7:33:08 AM By: [name] MD Entered By: [name] on 12/13/2015 13:33:41 [name]  ([id number]) -------------------------------------------------------------------------------- Progress Note Details Patient Name: [name]. Date of Service: 12/13/2015 12:45 PM Medical Record Patient Account Number: [id number] Number: Treating RN: Baruch Gouty, RN, BSN, Rita [birth year] [phone] y.o. Other Clinician: Date of Birth/Sex: Male) Treating Calista Crain Primary Care Physician/Extender: Rose Fillers, JEFFREY Physician: Referring Physician: Buddy Duty, JEFFREY Weeks in Treatment: 7 Subjective Chief Complaint Information obtained from Patient Chronic left calf traumatic ulcer (healed). Left third toe ulcer (healed). New right calf ulcer. 10/25/15; patient returns today with a wound over his left lateral foot History of Present Illness (HPI) Pleasant 55 year old with history of diabetes (Hgb A1c 10.8 in 2014) and peripheral neuropathy. No PVD. L ABI 1.1. Status post right great toe partial amputation years ago. He was at work and on 10/22/2014, was injured by a cart, and suffered an ulceration to his left anterior calf. He says that it subsequently became infected, and he was treated with a course of antibiotics. He was found on initial exam to have an ulceration on the dorsum of his left third toe. He was unaware of this and attributes it to pressure from his steel toed boots. More recently he injured his right anterior calf on a cart. Ambulating normally per his baseline. He has been undergoing regular debridements, applying mupirocin cream, and an Ace wrap for edema control. He returns to clinic for follow-up and is without complaints. No pain. No fever or chills. No drainage. 10/25/15; this is a 55 year old man who has type II diabetes with diabetic polyneuropathy. He tells me that he fractured his left fifth metatarsal in June 2016 when he presented with swelling. He does not recall a specific injury. His hemoglobin A1c was apparently too high at the time for consideration of  surgery and he was put in some form of offloading. Ultimately he went to surgery in December with an allograft from his calcaneus to this site, plate and screws. He had an x-ray of the foot in March that showed concerns about nonunion. He tells me that in March he had to move and basically moved himself. He was on his foot a lot and then noticed some drainage from an open area. He has been following with his orthopedic surgeon Dr. Doran Durand. He has been applying a felt donut, dry dressing and using his heel healing sandal. 11/01/15; this is a patient I saw last week for the first time. He  had a small open wound on the plantar aspect of his left foot at roughly the level of the base of his fifth metatarsal. He had a considerable degree of thickened skin around this wound on the plantar aspect which I thought was from chronic pressure on this area. He tells [place] that he had drainage over the course of the week. No systemic symptoms. 11/08/15; culture last week grew Citrobacter korseri. This should've been sensitive to the Augmentin I gave him. He has seen Dr. Doran Durand who did his initial surgery and according the patient the plan is to give this another month and then the hardware might need to come out of this. This seems like a reasonable plan. I will adjust his antibiotics to ciprofloxacin which probably should continue for at least another 2 weeks. I gave him 10 days worth today [name], [name] ([id number]) 11/22/15 the patient has completed antibiotics. He has an appointment with Dr. Doran Durand this Friday. There is improved dimensions around the wound on the left fifth metatarsal base 11/29/15; the patient has completed antibiotics last week. Apparently his appointment with Dr. Doran Durand it is not until this Friday. Dimensions are roughly the same. 12/06/15; saw Dr. Doran Durand. No x-ray told the end of the month, next appointment June 30. We have been using Aquacel Ag 12/13/15: No major change this week. Using  Aquacel AG Objective Constitutional Vitals Time Taken: 1:30 AM, Height: 70 in, Weight: 257 lbs, BMI: 36.9, Temperature: 97.8 F, Pulse: 98 bpm, Respiratory Rate: 16 breaths/min, Blood Pressure: 136/71 mmHg. Integumentary (Hair, Skin) Wound #4 status is Open. Original cause of wound was Pressure Injury. The wound is located on the [place]. The wound measures 1cm length x 1.5cm width x 0.1cm depth; 1.178cm^2 area and 0.118cm^3 volume. The wound is limited to skin breakdown. There is no tunneling or undermining noted. There is a large amount of serosanguineous drainage noted. The wound margin is distinct with the outline attached to the wound base. There is medium (34-66%) pink granulation within the wound bed. There is a small (1-33%) amount of necrotic tissue within the wound bed including Adherent Slough. The periwound skin appearance exhibited: Callus, Moist. The periwound skin appearance did not exhibit: Crepitus, Excoriation, Fluctuance, Friable, Induration, Localized Edema, Rash, Scarring, Dry/Scaly, Maceration, Atrophie Blanche, Cyanosis, Ecchymosis, Hemosiderin Staining, Mottled, Pallor, Rubor, Erythema. Periwound temperature was noted as No Abnormality. The periwound has tenderness on palpation. Assessment Active Problems ICD-10 L97.521 - Non-pressure chronic ulcer of other part of left foot limited to breakdown of skin E11.621 - Type 2 diabetes mellitus with foot ulcer E11.42 - Type 2 diabetes mellitus with diabetic polyneuropathy L03.116 - Cellulitis of left lower limb [name], [name]. ([id number]) Procedures Wound #4 Wound #4 is a Diabetic Wound/Ulcer of the Lower Extremity located on the Left,Lateral,Plantar Foot . There was a Skin/Subcutaneous Tissue Debridement [id number]) debridement with total area of 1.5 sq cm performed by Ricard Dillon, MD. with the following instrument(s): Curette to remove Non-Viable tissue/material including Fibrin/Slough,  Callus, and Subcutaneous after achieving pain control using Lidocaine 4% Topical Solution. A time out was conducted prior to the start of the procedure. A Minimum amount of bleeding was controlled with Pressure. The procedure was tolerated well with a pain level of 0 throughout and a pain level of 0 following the procedure. Post Debridement Measurements: 1cm length x 1.5cm width x 0.1cm depth; 0.118cm^3 volume. Post procedure Diagnosis Wound #4: Same as Pre-Procedure Plan Wound Cleansing: Wound #4 Left,Lateral,Plantar Foot: Cleanse wound with mild soap  and water May Shower, gently pat wound dry prior to applying new dressing. May shower with protection. Anesthetic: Wound #4 Left,Lateral,Plantar Foot: Topical Lidocaine 4% cream applied to wound bed prior to debridement Primary Wound Dressing: Wound #4 Left,Lateral,Plantar Foot: Other: - RTD Secondary Dressing: Wound #4 Left,Lateral,Plantar Foot: Gauze and Kerlix/Conform Dressing Change Frequency: Wound #4 Left,Lateral,Plantar Foot: Change dressing every day. Follow-up Appointments: Wound #4 Left,Lateral,Plantar Foot: Return Appointment in 1 week. Off-Loading: Wound #4 Left,Lateral,Plantar Foot: Other: - felt Additional Orders / Instructions: Wound #4 Left,Lateral,Plantar Foot: Increase protein intake. Activity as tolerated [name], [name]. ([id number]) change AquacelAG to RTD. wrap this time with porfore lite, He will not change this Electronic Signature(s) Signed: 01/04/2016 7:33:08 AM By: [name] MD Entered By: [name] on 12/13/2015 13:37:48 [name] ([id number]) -------------------------------------------------------------------------------- SuperBill Details Patient Name: [name]. Date of Service: 12/13/2015 Medical Record Patient Account Number: [id number] Number: Treating RN: Baruch Gouty, RN, BSN, Rita 27-Mar-1961 [phone] y.o. Other Clinician: Date of Birth/Sex: Male) Treating Myeesha Shane,  Hyacinth Marcelli Primary Care Physician: Buddy Duty, JEFFREY Physician/Extender: G Referring Physician: Buddy Duty, JEFFREY Weeks in Treatment: 7 Diagnosis Coding ICD-10 Codes Code Description L97.521 Non-pressure chronic ulcer of other part of left foot limited to breakdown of skin E11.621 Type 2 diabetes mellitus with foot ulcer E11.42 Type 2 diabetes mellitus with diabetic polyneuropathy L03.116 Cellulitis of left lower limb Facility Procedures CPT4: Description Modifier Quantity Code [id number] - DEB SUBQ TISSUE 20 SQ CM/< 1 ICD-10 Description Diagnosis L97.521 Non-pressure chronic ulcer of other part of left foot limited to breakdown of skin Physician Procedures CPT4: Description Modifier Quantity Code [id number] - WC PHYS SUBQ TISS 20 SQ CM 1 ICD-10 Description Diagnosis L97.521 Non-pressure chronic ulcer of other part of left foot limited to breakdown of skin Electronic Signature(s) Signed: 01/04/2016 7:33:08 AM By: [name] MD Entered By: [name] on 12/13/2015 13:38:38

## 2016-01-04 NOTE — Progress Notes (Signed)
[name], [name] ([id number]) Visit Report for 12/13/2015 Arrival Information Details Patient Name: [name], [name]. Date of Service: 12/13/2015 12:45 PM Medical Record Patient Account Number: [id number] Number: Treating RN: Baruch Gouty, RN, BSN, Rita Jul 24, 1960 [phone] y.o. Other Clinician: Date of Birth/Sex: Male) Treating ROBSON, MICHAEL Primary Care Physician: Buddy Duty, JEFFREY Physician/Extender: G Referring Physician: Buddy Duty, JEFFREY Weeks in Treatment: 7 Visit Information History Since Last Visit Added or deleted any medications: No Patient Arrived: Ambulatory Any new allergies or adverse reactions: No Arrival Time: 12:57 Had a fall or experienced change in No Accompanied By: self activities of daily living that may affect Transfer Assistance: None risk of falls: Patient Identification Verified: Yes Signs or symptoms of abuse/neglect since last No Secondary Verification Process Yes visito Completed: Hospitalized since last visit: No Patient Requires Transmission-Based No Has Dressing in Place as Prescribed: Yes Precautions: Pain Present Now: No Patient Has Alerts: No Electronic Signature(s) Signed: 12/13/2015 5:17:42 PM By: Regan Lemming BSN, RN Entered By: Regan Lemming on 12/13/2015 13:02:32 [name] ([id number]) -------------------------------------------------------------------------------- Encounter Discharge Information Details Patient Name: [name]. Date of Service: 12/13/2015 12:45 PM Medical Record Patient Account Number: [id number] Number: Treating RN: Baruch Gouty, RN, BSN, Rita Apr 05, 1961 [phone] y.o. Other Clinician: Date of Birth/Sex: Male) Treating ROBSON, MICHAEL Primary Care Physician: Buddy Duty, JEFFREY Physician/Extender: G Referring Physician: Buddy Duty, JEFFREY Weeks in Treatment: 7 Encounter Discharge Information Items Discharge Pain Level: 0 Discharge Condition: Stable Ambulatory Status: Ambulatory Discharge Destination:  Home Transportation: Private Auto Accompanied By: self Schedule Follow-up Appointment: No Medication Reconciliation completed and provided to Patient/Care No [name]: Provided on Clinical Summary of Care: 12/13/2015 Form Type Recipient Paper Patient HP Electronic Signature(s) Signed: 12/13/2015 1:43:28 PM By: Ruthine Dose Entered By: Ruthine Dose on 12/13/2015 13:43:27 [name] ([id number]) -------------------------------------------------------------------------------- Lower Extremity Assessment Details Patient Name: [name]. Date of Service: 12/13/2015 12:45 PM Medical Record Patient Account Number: [id number] Number: Treating RN: Baruch Gouty, RN, BSN, Rita [birth year] [phone] y.o. Other Clinician: Date of Birth/Sex: Male) Treating ROBSON, [place] Primary Care Physician: Buddy Duty, JEFFREY Physician/Extender: G Referring Physician: Buddy Duty, JEFFREY Weeks in Treatment: 7 Edema Assessment Assessed: [Left: No] [Right: No] E[Left: dema] [Right: :] Calf Left: Right: Point of Measurement: 36 cm From Medial Instep cm cm Ankle Left: Right: Point of Measurement: 10 cm From Medial Instep cm cm Vascular Assessment Claudication: Claudication Assessment [Left:None] Pulses: Posterior Tibial Dorsalis Pedis Palpable: [Left:Yes] Doppler: [Left:Multiphasic] Extremity colors, hair growth, and conditions: Extremity Color: [Left:Normal] Hair Growth on Extremity: [Left:Yes] Temperature of Extremity: [Left:Warm] Capillary Refill: [Left:< 3 seconds] Electronic Signature(s) Signed: 12/13/2015 5:17:42 PM By: Regan Lemming BSN, RN Entered By: Regan Lemming on 12/13/2015 13:08:16 [name] ([id number]) -------------------------------------------------------------------------------- Multi Wound Chart Details Patient Name: [name]. Date of Service: 12/13/2015 12:45 PM Medical Record Patient Account Number: [id number] Number: Treating RN: Baruch Gouty, RN, BSN,  Rita 02-12-1961 [phone] y.o. 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] Comorbid History: [4:Hypertension, Type II Diabetes, Neuropathy] [N/A:N/A] Date Acquired: [4:07/12/2015] [N/A:N/A] Weeks of Treatment: [4:7] [N/A:N/A] Wound Status: [4:Open] [N/A:N/A] Measurements L x W x D 1x1.5x0.1 [N/A:N/A] (cm) Area (cm) : [4:1.178] [N/A:N/A] Volume (  cm) : [4:0.118] [N/A:N/A] % Reduction in Area: [4:-50.10%] [N/A:N/A] % Reduction in Volume: 50.00% [N/A:N/A] Classification: [4:Grade 1] [N/A:N/A] Exudate Amount: [4:Large] [N/A:N/A] Exudate Type: [4:Serosanguineous] [N/A:N/A] Exudate Color: [4:red, brown] [N/A:N/A] Wound Margin: [4:Distinct, outline attached N/A] Granulation Amount: [4:Medium (34-66%)] [N/A:N/A] Granulation Quality: [4:Pink] [N/A:N/A] Necrotic Amount: [4:Small (1-33%)] [N/A:N/A] Exposed Structures: [4:Fascia: No Fat: No Tendon: No Muscle: No] [N/A:N/A] Joint: No Bone: No Limited to Skin Breakdown Epithelialization: None N/A N/A Periwound Skin Texture: Callus: Yes N/A N/A Edema: No Excoriation: No Induration: No Crepitus: No Fluctuance: No Friable: No Rash: No Scarring: No Periwound Skin Moist: Yes N/A N/A Moisture: Maceration: No Dry/Scaly: No Periwound Skin Color: Atrophie Blanche: No N/A N/A Cyanosis: No Ecchymosis: No Erythema: No Hemosiderin Staining: No Mottled: No Pallor: No Rubor: No Temperature: No Abnormality N/A N/A Tenderness on  Yes N/A N/A Palpation: Wound Preparation: Ulcer Cleansing: N/A N/A Rinsed/Irrigated with Saline Topical Anesthetic Applied: Other: lidocaine 4% Treatment Notes Electronic Signature(s) Signed: 12/13/2015 5:17:42 PM By: Regan Lemming BSN, RN Entered By: Regan Lemming on 12/13/2015 13:16:45 [name] ([id number]) -------------------------------------------------------------------------------- [place] Details Patient Name: [name], [name]. Date of Service: 12/13/2015 12:45 PM Medical Record Patient Account Number: [id number] Number: Treating RN: Baruch Gouty, RN, BSN, Rita [birth year] [phone] y.o. Other Clinician: Date of Birth/Sex: Male) Treating ROBSON, [place] Primary Care Physician: Buddy Duty, JEFFREY Physician/Extender: G Referring Physician: Buddy Duty, JEFFREY Weeks in Treatment: 7 Active Inactive Orientation to the Wound Care Program Nursing Diagnoses: Knowledge deficit related to the wound healing center program Goals: Patient/caregiver will verbalize understanding of the [place] Program Date Initiated: 10/25/2015 Goal Status: Active Interventions: Provide education on orientation to the wound center Notes: Peripheral Neuropathy Nursing Diagnoses: Knowledge deficit related to disease process and management of peripheral neurovascular dysfunction Potential alteration in peripheral tissue perfusion (select prior to confirmation of diagnosis) Goals: Patient/caregiver will verbalize understanding of disease process and disease management Date Initiated: 10/25/2015 Goal Status: Active Interventions: Assess signs and symptoms of neuropathy upon admission and as needed Provide education on Management of Neuropathy and Related Ulcers Provide education on Management of Neuropathy upon discharge from the [place] [facility] Activities: Test ordered outside of clinic : 10/25/2015 Notes: GLORIA, DELAMATER ([id number]) Wound/Skin Impairment Nursing  Diagnoses: Impaired tissue integrity Knowledge deficit related to smoking impact on wound healing Knowledge deficit related to ulceration/compromised skin integrity Goals: Patient/caregiver will verbalize understanding of skin care regimen Date Initiated: 10/25/2015 Goal Status: Active Ulcer/skin breakdown will have a volume reduction of 30% by week 4 Date Initiated: 10/25/2015 Goal Status: Active Ulcer/skin breakdown will have a volume reduction of 50% by week 8 Date Initiated: 10/25/2015 Goal Status: Active Ulcer/skin breakdown will have a volume reduction of 80% by week 12 Date Initiated: 10/25/2015 Goal Status: Active Ulcer/skin breakdown will heal within 14 weeks Date Initiated: 10/25/2015 Goal Status: Active Interventions: Assess patient/caregiver ability to obtain necessary supplies Assess patient/caregiver ability to perform ulcer/skin care regimen upon admission and as needed Provide education on ulcer and skin care Treatment Activities: Skin care regimen initiated : 10/25/2015 Topical wound management initiated : 10/25/2015 Notes: Electronic Signature(s) Signed: 12/13/2015 5:17:42 PM By: Regan Lemming BSN, RN Entered By: Regan Lemming on 12/13/2015 13:16:37 [name] ([id number]) -------------------------------------------------------------------------------- Pain Assessment Details Patient Name: [name]. Date of Service: 12/13/2015 12:45 PM Medical Record Patient Account Number: [id number] Number: Treating RN: Baruch Gouty, RN, BSN, Rita Apr 20, 1961 [phone] y.o. Other Clinician: Date of Birth/Sex: Male) Treating ROBSON, MICHAEL Primary Care Physician: Buddy Duty, JEFFREY Physician/Extender: G Referring Physician:  KERR, JEFFREY Weeks in Treatment: 7 Active Problems Location of Pain Severity and Description of Pain Patient Has Paino No Site Locations With Dressing Change: No Pain Management and Medication Current Pain Management: Electronic Signature(s) Signed:  12/13/2015 5:17:42 PM By: Regan Lemming BSN, RN Entered By: Regan Lemming on 12/13/2015 13:02:49 [name] ([id number]) -------------------------------------------------------------------------------- Patient/Caregiver Education Details Patient Name: [name]. Date of Service: 12/13/2015 12:45 PM Medical Record Patient Account Number: [id number] Number: Treating RN: Baruch Gouty, RN, BSN, Rita [birth year] [phone] y.o. Other Clinician: Date of Birth/Gender: Male) Treating ROBSON, [place] Primary Care Physician: Buddy Duty, JEFFREY Physician/Extender: G Referring Physician: Mackey [place]ch[place] in Treatment: 7 Education Assessment Education Provided To: Patient Education Topics Provided Peripheral Neuropathy: Methods: Explain/Verbal Responses: State content correctly Welcome To [facility]: Methods: Explain/Verbal Responses: State content correctly Wound Debridement: Methods: Explain/Verbal Responses: State content correctly Wound/Skin Impairment: Methods: Explain/Verbal Responses: State content correctly Electronic Signature(s) Signed: 12/13/2015 5:17:42 PM By: Regan Lemming BSN, RN Entered By: Regan Lemming on 12/13/2015 13:32:03 [name] ([id number]) -------------------------------------------------------------------------------- Wound Assessment Details Patient Name: [name]. Date of Service: 12/13/2015 12:45 PM Medical Record Patient Account Number: [id number] Number: Treating RN: Baruch Gouty, RN, BSN, Rita 03-31-1961 [phone] y.o. Other Clinician: Date of Birth/Sex: Male) Treating ROBSON, MICHAEL Primary Care Physician: Buddy Duty, JEFFREY Physician/Extender: G Referring Physician: Buddy Duty, JEFFREY Weeks in Treatment: 7 Wound Status Wound Number: 4 Primary Diabetic Wound/Ulcer of the Lower Etiology: Extremity Wound Location: Left Foot - Plantar, Lateral Wound Status: Open Wounding Event: Pressure Injury Comorbid Hypertension, Type II Diabetes, Date  Acquired: 07/12/2015 History: Neuropathy Weeks Of Treatment: 7 Clustered Wound: No Photos Photo Uploaded By: Regan Lemming on 12/13/2015 17:14:52 Wound Measurements Length: (cm) 1 Width: (cm) 1.5 Depth: (cm) 0.1 Area: (cm) 1.178 Volume: (cm) 0.118 % Reduction in Area: -50.1% % Reduction in Volume: 50% Epithelialization: None Tunneling: No Undermining: No Wound Description Classification: Grade 1 Wound Margin: Distinct, outline attached Exudate Amount: Large Exudate Type: Serosanguineous Exudate Color: red, brown Foul Odor After Cleansing: No Wound Bed Granulation Amount: Medium (34-66%) Exposed Structure Granulation Quality: Pink Fascia Exposed: No Necrotic Amount: Small (1-33%) Fat Layer Exposed: No [name], [name] ([id number]) Necrotic Quality: Adherent Slough Tendon Exposed: No Muscle Exposed: No Joint Exposed: No Bone Exposed: No Limited to Skin Breakdown Periwound Skin Texture Texture Color No Abnormalities Noted: No No Abnormalities Noted: No Callus: Yes Atrophie Blanche: No Crepitus: No Cyanosis: No Excoriation: No Ecchymosis: No Fluctuance: No Erythema: No Friable: No Hemosiderin Staining: No Induration: No Mottled: No Localized Edema: No Pallor: No Rash: No Rubor: No Scarring: No Temperature / Pain Moisture Temperature: No Abnormality No Abnormalities Noted: No Tenderness on Palpation: Yes Dry / Scaly: No Maceration: No Moist: Yes Wound Preparation Ulcer Cleansing: Rinsed/Irrigated with Saline Topical Anesthetic Applied: Other: lidocaine 4%, Electronic Signature(s) Signed: 12/13/2015 5:17:42 PM By: Regan Lemming BSN, RN Entered By: Regan Lemming on 12/13/2015 13:15:44 [name] ([id number]) -------------------------------------------------------------------------------- Vitals Details Patient Name: [name]. Date of Service: 12/13/2015 12:45 PM Medical Record Patient Account Number: [id number] Number: Treating  RN: Baruch Gouty, RN, BSN, Rita 10-04-1960 [phone] y.o. Other Clinician: Date of Birth/Sex: Male) Treating ROBSON, MICHAEL Primary Care Physician: Buddy Duty, JEFFREY Physician/Extender: G Referring Physician: Buddy Duty, JEFFREY Weeks in Treatment: 7 Vital Signs Time Taken: 01:30 Temperature (F): 97.8 Height (in): 70 Pulse (bpm): 98 Weight (lbs): 257 Respiratory Rate (breaths/min): 16 Body Mass Index (BMI): 36.9 Blood Pressure (mmHg): 136/71 Reference Range: 80 - 120 mg / dl Electronic Signature(s) Signed: 12/13/2015 5:17:42  PM By: Regan Lemming BSN, RN Entered By: Regan Lemming on 12/13/2015 13:07:21

## 2016-01-07 ENCOUNTER — Encounter: Payer: Self-pay | Admitting: Emergency Medicine

## 2016-01-07 ENCOUNTER — Emergency Department: Payer: Managed Care, Other (non HMO)

## 2016-01-07 ENCOUNTER — Emergency Department
Admission: EM | Admit: 2016-01-07 | Discharge: 2016-01-07 | Disposition: A | Discharge status: Home / Self Care | Payer: Managed Care, Other (non HMO) | Attending: Emergency Medicine | Admitting: Emergency Medicine

## 2016-01-07 DIAGNOSIS — L089 Local infection of the skin and subcutaneous tissue, unspecified: Secondary | ICD-10-CM

## 2016-01-07 DIAGNOSIS — M868X7 Other osteomyelitis, ankle and foot: Secondary | ICD-10-CM | POA: Insufficient documentation

## 2016-01-07 DIAGNOSIS — Z794 Long term (current) use of insulin: Secondary | ICD-10-CM | POA: Diagnosis not present

## 2016-01-07 DIAGNOSIS — E11628 Type 2 diabetes mellitus with other skin complications: Secondary | ICD-10-CM | POA: Diagnosis not present

## 2016-01-07 DIAGNOSIS — M8610 Other acute osteomyelitis, unspecified site: Secondary | ICD-10-CM

## 2016-01-07 DIAGNOSIS — T814XXA Infection following a procedure, initial encounter: Secondary | ICD-10-CM | POA: Diagnosis present

## 2016-01-07 DIAGNOSIS — Y813 Surgical instruments, materials and general- and plastic-surgery devices (including sutures) associated with adverse incidents: Secondary | ICD-10-CM | POA: Insufficient documentation

## 2016-01-07 DIAGNOSIS — I10 Essential (primary) hypertension: Secondary | ICD-10-CM | POA: Diagnosis not present

## 2016-01-07 DIAGNOSIS — M1990 Unspecified osteoarthritis, unspecified site: Secondary | ICD-10-CM | POA: Insufficient documentation

## 2016-01-07 LAB — CBC
HCT: 39.1 % — ABNORMAL LOW (ref 40.0–52.0)
Hemoglobin: 13.5 g/dL (ref 13.0–18.0)
MCH: 31.6 pg (ref 26.0–34.0)
MCHC: 34.4 g/dL (ref 32.0–36.0)
MCV: 91.8 fL (ref 80.0–100.0)
PLATELETS: 288 10*3/uL (ref 150–440)
RBC: 4.26 MIL/uL — AB (ref 4.40–5.90)
RDW: 13.7 % (ref 11.5–14.5)
WBC: 9.4 10*3/uL (ref 3.8–10.6)

## 2016-01-07 LAB — BASIC METABOLIC PANEL
ANION GAP: 9 (ref 5–15)
BUN: 22 mg/dL — ABNORMAL HIGH (ref 6–20)
CALCIUM: 9 mg/dL (ref 8.9–10.3)
CO2: 26 mmol/L (ref 22–32)
Chloride: 100 mmol/L — ABNORMAL LOW (ref 101–111)
Creatinine, Ser: 0.84 mg/dL (ref 0.61–1.24)
GLUCOSE: 198 mg/dL — AB (ref 65–99)
POTASSIUM: 4.3 mmol/L (ref 3.5–5.1)
Sodium: 135 mmol/L (ref 135–145)

## 2016-01-07 LAB — SEDIMENTATION RATE: SED RATE: 56 mm/h — AB (ref 0–20)

## 2016-01-07 MED ORDER — DOXYCYCLINE HYCLATE 100 MG PO TABS
100.0000 mg | ORAL_TABLET | Freq: Once | ORAL | Status: AC
  Administered 2016-01-07: 100 mg via ORAL
  Filled 2016-01-07: qty 1

## 2016-01-07 MED ORDER — SULFAMETHOXAZOLE-TRIMETHOPRIM 800-160 MG PO TABS: 1.0000 | ORAL_TABLET | Freq: Two times a day (BID) | ORAL | Status: DC

## 2016-01-07 MED ORDER — SULFAMETHOXAZOLE-TRIMETHOPRIM 800-160 MG PO TABS
1.0000 | ORAL_TABLET | Freq: Once | ORAL | Status: AC
  Administered 2016-01-07: 1 via ORAL
  Filled 2016-01-07: qty 1

## 2016-01-07 NOTE — ED Provider Notes (Signed)
[facility] Emergency Department Provider Note   ____________________________________________  Time seen: Approximately 9:00 AM  I have reviewed the triage vital signs and the nursing notes.   HISTORY  Chief Complaint Wound Infection    HPI [name] is a 55 y.o. male he reports Dr. Doran Durand of [facility] orthopedics did surgery on his left foot in December. He had an wound infection that was treated and resolved. He's been seeing the wound care center for his diabetic foot problems and missed an appointment last week and then he had a lot of pus burst out of the lateral side of his left foot and is been using ever since. Yesterday. He is not running a fever and has no other complaints   Past Medical History  Diagnosis Date  . Diabetes mellitus   . Osteomyelitis of toe of right foot ([place])   . Hypertension   . Sleep apnea     wears CPAP nightly  . Arthritis   . Metatarsal bone fracture     left 5th toe  . BPH (benign prostatic hypertrophy)     There are no active problems to display for this patient.   Past Surgical History  Procedure Laterality Date  . Patella reconstruction Left 2005  . Prostate ablation  2014  . Toe amputation      partial amputation right great toe  . Orif toe fracture Left 06/08/2015    Procedure: OPEN REDUCTION INTERNAL FIXATION (ORIF) LEFT FIFTH METATARSAL BASE FRACTURE NONUNION; CALCANEAL AUTOGRAFT ;  Surgeon: Wylene Simmer, MD;  Location: [place];  Service: Orthopedics;  Laterality: Left;    Current Outpatient Rx  Name  Route  Sig  Dispense  Refill  . aspirin EC 325 MG tablet   Oral   Take 1 tablet (325 mg total) by mouth daily.   42 tablet   0   . cephALEXin (KEFLEX) 500 MG capsule   Oral   Take 1 capsule (500 mg total) by mouth 4 (four) times daily.   40 capsule   0   . docusate sodium (COLACE) 100 MG capsule      Take 1 tablet once or twice daily as needed for constipation while  taking narcotic pain medicine   30 capsule   0   . empagliflozin (JARDIANCE) 25 MG TABS tablet   Oral   Take 25 mg by mouth daily.         Marland Kitchen HYDROcodone-acetaminophen (NORCO/VICODIN) 5-325 MG tablet   Oral   Take 1-2 tablets by mouth every 4 (four) hours as needed for moderate pain.   30 tablet   0   . insulin aspart (NOVOLOG) 100 UNIT/ML injection   Subcutaneous   Inject into the skin 3 (three) times daily before meals.         . Insulin Degludec (TRESIBA FLEXTOUCH) 100 UNIT/ML SOPN   Subcutaneous   Inject 35 Units into the skin at bedtime.         . multivitamin-iron-minerals-folic acid (CENTRUM) chewable tablet   Oral   Chew 1 tablet by mouth daily.         Marland Kitchen olmesartan-hydrochlorothiazide (BENICAR HCT) 40-25 MG per tablet   Oral   Take 1 tablet by mouth daily.         Marland Kitchen oxyCODONE (ROXICODONE) 5 MG immediate release tablet   Oral   Take 1-2 tablets (5-10 mg total) by mouth every 4 (four) hours as needed for moderate pain or severe pain.  30 tablet   0   . oxyCODONE-acetaminophen (PERCOCET) 5-325 MG tablet   Oral   Take 2 tablets by mouth every 6 (six) hours as needed for moderate pain or severe pain.   30 tablet   0   . senna (SENOKOT) 8.6 MG TABS tablet   Oral   Take 2 tablets (17.2 mg total) by mouth 2 (two) times daily.   30 each   0   . sulfamethoxazole-trimethoprim (BACTRIM DS,SEPTRA DS) 800-160 MG tablet   Oral   Take 1 tablet by mouth 2 (two) times daily.   20 tablet   0   . tamsulosin (FLOMAX) 0.4 MG CAPS capsule   Oral   Take 0.4 mg by mouth.           Allergies Review of patient's allergies indicates no known allergies.  History reviewed. No pertinent family history.  Social History Social History  Substance Use Topics  . Smoking status: Never Smoker   . Smokeless tobacco: None  . Alcohol Use: No    Review of Systems Constitutional: No fever/chills Eyes: No visual changes. ENT: No sore throat. Cardiovascular:  Denies chest pain. Respiratory: Denies shortness of breath. Gastrointestinal: No abdominal pain.  No nausea, no vomiting.  No diarrhea.  No constipation. Genitourinary: Negative for dysuria. Musculoskeletal: Negative for back pain. Skin: Negative for rash. Neurological: Negative for headaches, focal weakness or numbness.  10-point ROS otherwise negative.  ____________________________________________   PHYSICAL EXAM:  VITAL SIGNS: ED Triage Vitals  Enc Vitals Group     BP 01/07/16 0832 150/87 mmHg     Pulse Rate 01/07/16 0832 113     Resp 01/07/16 0832 18     Temp 01/07/16 0832 98.1 F (36.7 C)     Temp src --      SpO2 01/07/16 0832 97 %     Weight 01/07/16 0832 260 lb (117.935 kg)     Height 01/07/16 0832 5\' 10"  (1.778 m)     Head Cir --      Peak Flow --      Pain Score 01/07/16 0833 9     Pain Loc --      Pain Edu? --      Excl. in [place]? --     Constitutional: Alert and oriented. Well appearing and in no acute distress. Eyes: Conjunctivae are normal. PERRL. EOMI. Head: Atraumatic. Nose: No congestion/rhinnorhea. Mouth/Throat: Mucous membranes are moist.  Oropharynx non-erythematous. Neck: No stridor. Cardiovascular: Normal rate, regular rhythm. Grossly normal heart sounds.  Good peripheral circulation. Respiratory: Normal respiratory effort.  No retractions. Lungs CTAB. Gastrointestinal: Soft and nontender. No distention. No abdominal bruits. No CVA tenderness. Musculoskeletal: Lateral side left foot is swollen and tender there are several draining ulcers pus is coming out. Neurologic:  Normal speech and language. No gross focal neurologic deficits are appreciated. No gait instability. Skin:  Skin is warm, dry and intact. No rash noted. Psychiatric: Mood and affect are normal. Speech and behavior are normal.  ____________________________________________   LABS (all labs ordered are listed, but only abnormal results are displayed)  Labs Reviewed  CBC - Abnormal;  Notable for the following:    RBC 4.26 (*)    HCT 39.1 (*)    All other components within normal limits  BASIC METABOLIC PANEL - Abnormal; Notable for the following:    Chloride 100 (*)    Glucose, Bld 198 (*)    BUN 22 (*)    All other components within normal limits  SEDIMENTATION RATE - Abnormal; Notable for the following:    Sed Rate 56 (*)    All other components within normal limits   ____________________________________________  EKG   ____________________________________________  RADIOLOGY  X-ray shows some air in the soft tissues. There also may be a sequestrum present. Waiting on the radiology reporLEFT FOOT - COMPLETE 3+ VIEW  COMPARISON: 09/20/2015 left foot radiographs  FINDINGS: Soft tissue swelling throughout the lateral left foot, with soft tissue gas adjacent to the proximal left fifth metatarsal. Surgical plate with interlocking screws is seen at the lateral left fifth metatarsal, with no evidence of hardware fracture or loosening. Near complete healing of the proximal left fifth metatarsal fracture. Tiny erosion is seen at the base of the left fifth metatarsal. No additional cortical erosions. No new fracture. No malalignment. Tiny plantar left calcaneal spur.  IMPRESSION: 1. Soft tissue swelling throughout the lateral left foot. Soft tissue gas adjacent to the proximal left fifth metatarsal. Tiny cortical erosion at the base of the left fifth metatarsal. These findings are suggestive of acute osteomyelitis. 2. Near complete healing of the proximal left fifth metatarsal fracture status post ORIF, with no evidence of hardware fracture or loosening.   Electronically Signed  By: Ilona Sorrel M.D.  On: 01/07/2016 09:18    ____________________________________________   PROCEDURES Procedures    ____________________________________________   INITIAL IMPRESSION / ASSESSMENT AND PLAN / ED COURSE  Pertinent labs & imaging results that  were available during my care of the patient were reviewed by me and considered in my medical decision making (see chart for details).  Discussed in detail with Dr. Alvan Dame on-call for Dr. Doran Durand. He wishes me to give the patient Bactrim and doxycycline. Dr. Doran Durand will see him in the office tomorrow. Discussed this with the patient ____________________________________________   FINAL CLINICAL IMPRESSION(S) / ED DIAGNOSES  Final diagnoses:  Diabetic foot infection ([place])  Acute osteomyelitis, osteomyelitis of unspecified site [facility])      NEW MEDICATIONS STARTED DURING THIS VISIT:  Discharge Medication List as of 01/07/2016 10:53 AM    START taking these medications   Details  sulfamethoxazole-trimethoprim (BACTRIM DS,SEPTRA DS) 800-160 MG tablet Take 1 tablet by mouth 2 (two) times daily., Starting 01/07/2016, Until Discontinued, Print         Note:  This document was prepared using Dragon voice recognition software and may include unintentional dictation errors.    Nena Polio, MD 01/07/16 [phone]

## 2016-01-07 NOTE — ED Notes (Signed)
MD at bedside. 

## 2016-01-07 NOTE — Discharge Instructions (Signed)
Bone and Joint Infections, Adult °Bone infections (osteomyelitis) and joint infections (septic arthritis) occur when bacteria or other germs get inside a bone or a joint. This can happen if you have an infection in another part of your body that spreads through your blood. Germs from your skin or from outside of your body can also cause this type of infection if you have a wound or a broken bone (fracture) that breaks the skin. °Anyone can get a bone infection or joint infection. You may be more likely to get this type of infection if you have a condition, such as diabetes, that lowers your ability to fight infection or increases your chances of getting an infection. Bone and joint infections can cause damage, and they can spread to other areas of your body. They need to be treated quickly. °CAUSES °Most bone and joint infections are caused by bacteria. They can also be caused by other germs, such as viruses and funguses. °RISK FACTORS °This condition is more likely to develop in: °· People who recently had surgery, especially bone or joint surgery. °· People who have a long-term (chronic) disease, such as: °¨ HIV (human immunodeficiency virus). °¨ Diabetes. °¨ Rheumatoid arthritis. °¨ Sickle cell anemia. °· Elderly people. °· People who take medicines that block or weaken the body's defense system (immune system). °· People who have a condition that reduces their blood flow. °· People who are on kidney dialysis. °· People who have an artificial joint. °· People who have had a joint or bone repaired with plates or screws (surgical hardware). °· People who use or abuse IV drugs. °· People who have had trauma, such as stepping on a nail. °SYMPTOMS °Symptoms vary depending on the type and location of your infection. Common symptoms of bone and joint infections include: °· Fever and chills. °· Redness and warmth. °· Swelling. °· Pain and stiffness. °· Drainage of fluid or pus near the infection. °· Weight loss and  fatigue. °· Decreased ability to use a hand or foot. °DIAGNOSIS °This condition may be diagnosed based on symptoms, medical history, a physical exam, and diagnostic tests. Tests can help to identify the cause of the infection. You may have various tests, such as: °· A sample of tissue, fluid, or blood taken to be examined under a microscope. °· A procedure to remove fluid from the infected joint with a needle (joint aspiration) for testing in a lab. °· Pus or discharge swabbed from a wound for testing to identify germs and to determine what type of medicine will kill them (culture and sensitivity). °· Blood tests to look for evidence of infection and inflammation (biomarkers). °· Imaging studies to determine how severe the bone or joint infection is. These may include: °¨ X-rays. °¨ CT scan. °¨ MRI. °¨ Bone scan. °TREATMENT °Treatment depends on the cause and type of infection. Antibiotic medicines are usually the first treatment for a bone or joint infection. Treatment with antibiotics may include: °· Getting IV antibiotics. This may be done in a hospital at first. You may have to continue IV antibiotics at home for several weeks. You may also have to take antibiotics by mouth for several weeks after that. °· Taking more than one kind of antibiotic. Treatment may start with a type of antibiotic that works against many different bacteria (broad spectrum antibiotics). IV antibiotics may be changed if tests show that another type may work better. °Other treatments may include: °· Draining fluid from the joint by placing a needle into   it (aspiration).  Surgery to remove:  Dead or dying tissue from a bone or joint.  An infected artificial joint.  Infected plates or screws that were used to repair a broken bone. HOME CARE INSTRUCTIONS  Take medicines only as directed by your health care provider.  Take your antibiotic medicine as directed by your health care provider. Finish the antibiotic even if you start  to feel better.  Follow instructions from your health care provider about how to take IV antibiotics at home.  Ask your health care provider if you have any restrictions on your activities.  Keep all follow-up visits as directed by your health care provider. This is important. SEEK MEDICAL CARE IF:  You have a fever or chills.  You have redness, warmth, pain, or swelling that returns after treatment. SEEK IMMEDIATE MEDICAL CARE IF:  You have rapid breathing or you have trouble breathing.  You have chest pain.  You cannot drink fluids or make urine.  The affected arm or leg swells, changes color, or turns blue.   This information is not intended to replace advice given to you by your health care provider. Make sure you discuss any questions you have with your health care provider.   Document Released: 06/17/2005 Document Revised: 11/01/2014 Document Reviewed: 06/15/2014 Elsevier Interactive Patient Education [employer].   Please take the Bactrim and the doxycycline each one is one twice a day. Keep the foot up as much as possible. Call Dr. Doran Durand tomorrow. He should be over to see you in the office tomorrow. Return here for increasing pain fever or feeling sicker.

## 2016-01-07 NOTE — ED Notes (Addendum)
Pt states on Friday noticed his left foot was infected where he broke his foot.  Injury is healed but now has a wound to the lateral left foot.  Serosangious drainage noted stained on dressing.  Pt states wife noticed a foul odor when applying dressing last night.  Pt has been going to wound center for treatment.  Pt is a diabetic. Missed wound care appt on Wednesday due to being on vacation.

## 2016-01-07 NOTE — ED Notes (Addendum)
Left foot wound X 2 on lateral side of foot, approx 2cm diameter each. Pt states that he broke foot back in December, had surgery and has had infection in that foot since. Pt follows with the wound clinic and states wound has been getting better. Pain and worsening of wound began on Friday. No drainage at this time. Pain with weight bearing and with rest. Pt alert and oriented X4, active, cooperative, pt in NAD. RR even and unlabored, color WNL.

## 2016-01-08 ENCOUNTER — Ambulatory Visit
Admission: RE | Admit: 2016-01-08 | Discharge: 2016-01-08 | Disposition: A | Discharge status: Home / Self Care | Payer: Managed Care, Other (non HMO) | Source: Ambulatory Visit | Attending: Orthopedic Surgery | Admitting: Orthopedic Surgery

## 2016-01-08 ENCOUNTER — Other Ambulatory Visit: Payer: Self-pay | Admitting: Orthopedic Surgery

## 2016-01-08 DIAGNOSIS — E11621 Type 2 diabetes mellitus with foot ulcer: Secondary | ICD-10-CM

## 2016-01-08 DIAGNOSIS — L97421 Non-pressure chronic ulcer of left heel and midfoot limited to breakdown of skin: Secondary | ICD-10-CM

## 2016-01-10 ENCOUNTER — Encounter (HOSPITAL_COMMUNITY): Payer: Self-pay | Admitting: *Deleted

## 2016-01-10 ENCOUNTER — Ambulatory Visit: Payer: Self-pay | Admitting: Nurse Practitioner

## 2016-01-10 MED ORDER — SODIUM CHLORIDE 0.9 % IV SOLN
INTRAVENOUS | Status: DC
Start: 2016-01-11 — End: 2016-01-11

## 2016-01-10 NOTE — Progress Notes (Signed)
Pt denies SOB, chest pain, and being under the care of a cardiologist. Pt stated that a stress test was performed 5-6 years ago however, pt cannot recall the name of the ordering physician or the location where the study was done. Pt denies having a cardiac cath and echo. Pt denies having a chest x ray within the last year and an A1c within the last 60 days. Pt made aware to stop  taking Aspirin, vitamins, fish oil and herbal medications. Do not take any NSAIDs ie: Ibuprofen, Advil, Naproxen, BC and Goody Powder or any medication containing Aspirin. Pt stated that his pain medication and antibiotics are taken with food only to avoid nausea and vomiting. Pt stated that his fasting blood sugar usually ranges between 115-140. Pt made aware of diabetes protocol to check BS every 2 hours prior to arrival, interventions for BS<70 >220 and phone # to SS. Pt made aware to take 50 % of HS dose of Insulin Degludec (TRESIBA) =17 units instead of 35. Pt made aware to hold am diabetes medications such as Metformin and Novolog Insulin ( usually taken with breakfast). Pt verbalized understanding of all pre-op instructions.

## 2016-01-11 ENCOUNTER — Encounter (HOSPITAL_COMMUNITY): Payer: Self-pay | Admitting: Urology

## 2016-01-11 ENCOUNTER — Encounter (HOSPITAL_COMMUNITY)
Admission: AD | Disposition: A | Discharge status: Home Health | Payer: Self-pay | Source: Ambulatory Visit | Attending: Orthopedic Surgery

## 2016-01-11 ENCOUNTER — Inpatient Hospital Stay (HOSPITAL_COMMUNITY): Payer: Managed Care, Other (non HMO) | Admitting: Anesthesiology

## 2016-01-11 ENCOUNTER — Inpatient Hospital Stay (HOSPITAL_COMMUNITY)
Admission: AD | Admit: 2016-01-11 | Discharge: 2016-01-15 | DRG: 505 | Disposition: A | Discharge status: Home Health | Payer: Managed Care, Other (non HMO) | Source: Ambulatory Visit | Attending: Orthopedic Surgery | Admitting: Orthopedic Surgery

## 2016-01-11 DIAGNOSIS — E1165 Type 2 diabetes mellitus with hyperglycemia: Secondary | ICD-10-CM | POA: Diagnosis present

## 2016-01-11 DIAGNOSIS — E11628 Type 2 diabetes mellitus with other skin complications: Secondary | ICD-10-CM | POA: Diagnosis present

## 2016-01-11 DIAGNOSIS — E11621 Type 2 diabetes mellitus with foot ulcer: Secondary | ICD-10-CM | POA: Diagnosis present

## 2016-01-11 DIAGNOSIS — Z7982 Long term (current) use of aspirin: Secondary | ICD-10-CM | POA: Diagnosis not present

## 2016-01-11 DIAGNOSIS — L97523 Non-pressure chronic ulcer of other part of left foot with necrosis of muscle: Secondary | ICD-10-CM | POA: Diagnosis not present

## 2016-01-11 DIAGNOSIS — L97529 Non-pressure chronic ulcer of other part of left foot with unspecified severity: Secondary | ICD-10-CM | POA: Diagnosis present

## 2016-01-11 DIAGNOSIS — I10 Essential (primary) hypertension: Secondary | ICD-10-CM | POA: Diagnosis present

## 2016-01-11 DIAGNOSIS — L97503 Non-pressure chronic ulcer of other part of unspecified foot with necrosis of muscle: Secondary | ICD-10-CM | POA: Diagnosis present

## 2016-01-11 DIAGNOSIS — L0889 Other specified local infections of the skin and subcutaneous tissue: Secondary | ICD-10-CM | POA: Diagnosis not present

## 2016-01-11 DIAGNOSIS — B9689 Other specified bacterial agents as the cause of diseases classified elsewhere: Secondary | ICD-10-CM | POA: Diagnosis not present

## 2016-01-11 DIAGNOSIS — Z794 Long term (current) use of insulin: Secondary | ICD-10-CM

## 2016-01-11 DIAGNOSIS — E1142 Type 2 diabetes mellitus with diabetic polyneuropathy: Secondary | ICD-10-CM | POA: Diagnosis present

## 2016-01-11 DIAGNOSIS — L089 Local infection of the skin and subcutaneous tissue, unspecified: Secondary | ICD-10-CM | POA: Diagnosis present

## 2016-01-11 DIAGNOSIS — Z419 Encounter for procedure for purposes other than remedying health state, unspecified: Secondary | ICD-10-CM | POA: Insufficient documentation

## 2016-01-11 DIAGNOSIS — T847XXA Infection and inflammatory reaction due to other internal orthopedic prosthetic devices, implants and grafts, initial encounter: Secondary | ICD-10-CM | POA: Insufficient documentation

## 2016-01-11 DIAGNOSIS — Z7984 Long term (current) use of oral hypoglycemic drugs: Secondary | ICD-10-CM | POA: Diagnosis not present

## 2016-01-11 DIAGNOSIS — M2570 Osteophyte, unspecified joint: Secondary | ICD-10-CM | POA: Diagnosis present

## 2016-01-11 DIAGNOSIS — T8469XA Infection and inflammatory reaction due to internal fixation device of other site, initial encounter: Secondary | ICD-10-CM | POA: Diagnosis present

## 2016-01-11 DIAGNOSIS — M84375A Stress fracture, left foot, initial encounter for fracture: Secondary | ICD-10-CM | POA: Diagnosis present

## 2016-01-11 DIAGNOSIS — T8469XD Infection and inflammatory reaction due to internal fixation device of other site, subsequent encounter: Secondary | ICD-10-CM | POA: Diagnosis not present

## 2016-01-11 DIAGNOSIS — B951 Streptococcus, group B, as the cause of diseases classified elsewhere: Secondary | ICD-10-CM | POA: Diagnosis not present

## 2016-01-11 DIAGNOSIS — Z833 Family history of diabetes mellitus: Secondary | ICD-10-CM

## 2016-01-11 HISTORY — DX: Presence of spectacles and contact lenses: Z97.3

## 2016-01-11 HISTORY — DX: Type 2 diabetes mellitus with diabetic neuropathy, unspecified: E11.40

## 2016-01-11 HISTORY — DX: Infection following a procedure, unspecified, initial encounter: T81.40XA

## 2016-01-11 HISTORY — PX: I & D EXTREMITY: SHX5045

## 2016-01-11 LAB — GLUCOSE, CAPILLARY
GLUCOSE-CAPILLARY: 138 mg/dL — AB (ref 65–99)
Glucose-Capillary: 132 mg/dL — ABNORMAL HIGH (ref 65–99)
Glucose-Capillary: 123 mg/dL — ABNORMAL HIGH (ref 65–99)
Glucose-Capillary: 132 mg/dL — ABNORMAL HIGH (ref 65–99)

## 2016-01-11 SURGERY — IRRIGATION AND DEBRIDEMENT EXTREMITY
Anesthesia: General | Site: Foot | Laterality: Left

## 2016-01-11 MED ORDER — EPHEDRINE SULFATE 50 MG/ML IJ SOLN
INTRAMUSCULAR | Status: DC | PRN
  Administered 2016-01-11: 5 mg via INTRAVENOUS
  Administered 2016-01-11: 10 mg via INTRAVENOUS

## 2016-01-11 MED ORDER — CHLORHEXIDINE GLUCONATE 4 % EX LIQD: 60.0000 mL | Freq: Once | CUTANEOUS | Status: DC

## 2016-01-11 MED ORDER — LACTATED RINGERS IV SOLN
INTRAVENOUS | Status: DC
  Administered 2016-01-11 (×2): via INTRAVENOUS

## 2016-01-11 MED ORDER — METFORMIN HCL 500 MG PO TABS
1000.0000 mg | ORAL_TABLET | Freq: Two times a day (BID) | ORAL | Status: DC
  Administered 2016-01-12 – 2016-01-15 (×7): 1000 mg via ORAL
  Filled 2016-01-11 (×7): qty 2

## 2016-01-11 MED ORDER — SIMVASTATIN 20 MG PO TABS
20.0000 mg | ORAL_TABLET | Freq: Every day | ORAL | Status: DC
  Administered 2016-01-11 – 2016-01-15 (×3): 20 mg via ORAL
  Filled 2016-01-11 (×5): qty 1

## 2016-01-11 MED ORDER — VANCOMYCIN HCL 1000 MG IV SOLR
1000.0000 mg | INTRAVENOUS | Status: DC | PRN
  Administered 2016-01-11: 1000 mg via INTRAVENOUS

## 2016-01-11 MED ORDER — LOSARTAN POTASSIUM-HCTZ 100-25 MG PO TABS: 1.0000 | ORAL_TABLET | Freq: Every day | ORAL | Status: DC

## 2016-01-11 MED ORDER — INSULIN DETEMIR 100 UNIT/ML ~~LOC~~ SOLN
35.0000 [IU] | Freq: Every day | SUBCUTANEOUS | Status: DC
  Administered 2016-01-11 – 2016-01-14 (×4): 35 [IU] via SUBCUTANEOUS
  Filled 2016-01-11 (×5): qty 0.35

## 2016-01-11 MED ORDER — INSULIN ASPART 100 UNIT/ML ~~LOC~~ SOLN
0.0000 [IU] | Freq: Three times a day (TID) | SUBCUTANEOUS | Status: DC
Start: 2016-01-12 — End: 2016-01-15
  Administered 2016-01-12: 5 [IU] via SUBCUTANEOUS
  Administered 2016-01-13 – 2016-01-15 (×2): 2 [IU] via SUBCUTANEOUS

## 2016-01-11 MED ORDER — ACETAMINOPHEN 650 MG RE SUPP: 650.0000 mg | Freq: Four times a day (QID) | RECTAL | Status: DC | PRN

## 2016-01-11 MED ORDER — PHENYLEPHRINE 40 MCG/ML (10ML) SYRINGE FOR IV PUSH (FOR BLOOD PRESSURE SUPPORT)
PREFILLED_SYRINGE | INTRAVENOUS | Status: AC
  Filled 2016-01-11: qty 10

## 2016-01-11 MED ORDER — FENTANYL CITRATE (PF) 250 MCG/5ML IJ SOLN
INTRAMUSCULAR | Status: DC | PRN
  Administered 2016-01-11 (×2): 50 ug via INTRAVENOUS
  Administered 2016-01-11 (×2): 25 ug via INTRAVENOUS

## 2016-01-11 MED ORDER — LIDOCAINE HCL (CARDIAC) 20 MG/ML IV SOLN
INTRAVENOUS | Status: DC | PRN
  Administered 2016-01-11: 60 mg via INTRAVENOUS

## 2016-01-11 MED ORDER — SODIUM CHLORIDE 0.9 % IR SOLN
Status: DC | PRN
  Administered 2016-01-11: 3000 mL

## 2016-01-11 MED ORDER — PROPOFOL 10 MG/ML IV BOLUS
INTRAVENOUS | Status: DC | PRN
  Administered 2016-01-11: 250 mg via INTRAVENOUS
  Administered 2016-01-11: 20 mg via INTRAVENOUS

## 2016-01-11 MED ORDER — PHENYLEPHRINE HCL 10 MG/ML IJ SOLN
INTRAMUSCULAR | Status: DC | PRN
  Administered 2016-01-11 (×2): 80 ug via INTRAVENOUS
  Administered 2016-01-11: 40 ug via INTRAVENOUS
  Administered 2016-01-11 (×2): 80 ug via INTRAVENOUS
  Administered 2016-01-11: 40 ug via INTRAVENOUS
  Administered 2016-01-11: 80 ug via INTRAVENOUS
  Administered 2016-01-11: 120 ug via INTRAVENOUS
  Administered 2016-01-11 (×2): 80 ug via INTRAVENOUS
  Administered 2016-01-11: 40 ug via INTRAVENOUS

## 2016-01-11 MED ORDER — PROMETHAZINE HCL 25 MG/ML IJ SOLN: 6.2500 mg | INTRAMUSCULAR | Status: DC | PRN

## 2016-01-11 MED ORDER — TAMSULOSIN HCL 0.4 MG PO CAPS
0.4000 mg | ORAL_CAPSULE | Freq: Every day | ORAL | Status: DC
  Administered 2016-01-12 – 2016-01-15 (×4): 0.4 mg via ORAL
  Filled 2016-01-11 (×5): qty 1

## 2016-01-11 MED ORDER — OXYCODONE HCL 5 MG PO TABS
5.0000 mg | ORAL_TABLET | ORAL | Status: DC | PRN
  Administered 2016-01-11 – 2016-01-15 (×11): 5 mg via ORAL
  Filled 2016-01-11 (×11): qty 1

## 2016-01-11 MED ORDER — SODIUM CHLORIDE 0.9 % IV SOLN
INTRAVENOUS | Status: DC
  Administered 2016-01-12: 04:00:00 via INTRAVENOUS

## 2016-01-11 MED ORDER — ACETAMINOPHEN 325 MG PO TABS
650.0000 mg | ORAL_TABLET | Freq: Four times a day (QID) | ORAL | Status: DC | PRN
  Administered 2016-01-11 – 2016-01-13 (×2): 650 mg via ORAL
  Filled 2016-01-11 (×2): qty 2

## 2016-01-11 MED ORDER — ONDANSETRON HCL 4 MG/2ML IJ SOLN
INTRAMUSCULAR | Status: AC
  Filled 2016-01-11: qty 4

## 2016-01-11 MED ORDER — ONDANSETRON HCL 4 MG/2ML IJ SOLN
INTRAMUSCULAR | Status: DC | PRN
  Administered 2016-01-11: 4 mg via INTRAVENOUS

## 2016-01-11 MED ORDER — ONDANSETRON HCL 4 MG/2ML IJ SOLN: 4.0000 mg | Freq: Four times a day (QID) | INTRAMUSCULAR | Status: DC | PRN

## 2016-01-11 MED ORDER — INSULIN DEGLUDEC 100 UNIT/ML ~~LOC~~ SOPN: 35.0000 [IU] | PEN_INJECTOR | Freq: Every day | SUBCUTANEOUS | Status: DC

## 2016-01-11 MED ORDER — FENTANYL CITRATE (PF) 250 MCG/5ML IJ SOLN
INTRAMUSCULAR | Status: AC
  Filled 2016-01-11: qty 5

## 2016-01-11 MED ORDER — SODIUM CHLORIDE 0.9 % IR SOLN
Status: DC | PRN
  Administered 2016-01-11: 1000 mL

## 2016-01-11 MED ORDER — HYDROMORPHONE HCL 1 MG/ML IJ SOLN: 0.2500 mg | INTRAMUSCULAR | Status: DC | PRN

## 2016-01-11 MED ORDER — ASPIRIN EC 81 MG PO TBEC
81.0000 mg | DELAYED_RELEASE_TABLET | Freq: Every day | ORAL | Status: DC
  Administered 2016-01-12 – 2016-01-15 (×4): 81 mg via ORAL
  Filled 2016-01-11 (×5): qty 1

## 2016-01-11 MED ORDER — LOSARTAN POTASSIUM 50 MG PO TABS
100.0000 mg | ORAL_TABLET | Freq: Every day | ORAL | Status: DC
  Administered 2016-01-12 – 2016-01-15 (×4): 100 mg via ORAL
  Filled 2016-01-11 (×5): qty 2

## 2016-01-11 MED ORDER — MIDAZOLAM HCL 2 MG/2ML IJ SOLN
INTRAMUSCULAR | Status: AC
  Filled 2016-01-11: qty 2

## 2016-01-11 MED ORDER — HYDROCODONE-ACETAMINOPHEN 7.5-325 MG PO TABS: 1.0000 | ORAL_TABLET | Freq: Once | ORAL | Status: DC | PRN

## 2016-01-11 MED ORDER — LIDOCAINE 2% (20 MG/ML) 5 ML SYRINGE
INTRAMUSCULAR | Status: AC
  Filled 2016-01-11: qty 5

## 2016-01-11 MED ORDER — MIDAZOLAM HCL 2 MG/2ML IJ SOLN
INTRAMUSCULAR | Status: DC | PRN
  Administered 2016-01-11: 1 mg via INTRAVENOUS

## 2016-01-11 MED ORDER — ONDANSETRON HCL 4 MG PO TABS: 4.0000 mg | ORAL_TABLET | Freq: Four times a day (QID) | ORAL | Status: DC | PRN

## 2016-01-11 MED ORDER — INSULIN ASPART 100 UNIT/ML ~~LOC~~ SOLN: 10.0000 [IU] | Freq: Three times a day (TID) | SUBCUTANEOUS | Status: DC

## 2016-01-11 MED ORDER — HYDROCHLOROTHIAZIDE 25 MG PO TABS
25.0000 mg | ORAL_TABLET | Freq: Every day | ORAL | Status: DC
  Administered 2016-01-12 – 2016-01-15 (×4): 25 mg via ORAL
  Filled 2016-01-11 (×5): qty 1

## 2016-01-11 MED ORDER — VANCOMYCIN HCL IN DEXTROSE 1-5 GM/200ML-% IV SOLN
INTRAVENOUS | Status: AC
  Filled 2016-01-11: qty 200

## 2016-01-11 SURGICAL SUPPLY — 52 items
BANDAGE ELASTIC 4 VELCRO ST LF (GAUZE/BANDAGES/DRESSINGS) ×2 IMPLANT
BANDAGE ESMARK 6X9 LF (GAUZE/BANDAGES/DRESSINGS) ×1 IMPLANT
BLADE SURG 10 STRL SS (BLADE) ×2 IMPLANT
BNDG COHESIVE 4X5 TAN STRL (GAUZE/BANDAGES/DRESSINGS) ×2 IMPLANT
BNDG COHESIVE 6X5 TAN STRL LF (GAUZE/BANDAGES/DRESSINGS) ×2 IMPLANT
BNDG CONFORM 3 STRL LF (GAUZE/BANDAGES/DRESSINGS) ×2 IMPLANT
BNDG ESMARK 6X9 LF (GAUZE/BANDAGES/DRESSINGS) ×2
CANISTER SUCT 3000ML PPV (MISCELLANEOUS) ×2 IMPLANT
CHLORAPREP W/TINT 26ML (MISCELLANEOUS) ×2 IMPLANT
COVER SURGICAL LIGHT HANDLE (MISCELLANEOUS) ×2 IMPLANT
CUFF TOURNIQUET SINGLE 34IN LL (TOURNIQUET CUFF) ×2 IMPLANT
CUFF TOURNIQUET SINGLE 44IN (TOURNIQUET CUFF) IMPLANT
DRAIN CHANNEL 10M FLAT 3/4 FLT (DRAIN) IMPLANT
DRAIN PENROSE 1/2X12 LTX STRL (WOUND CARE) IMPLANT
DRAPE U-SHAPE 47X51 STRL (DRAPES) ×2 IMPLANT
DRSG MEPITEL 4X7.2 (GAUZE/BANDAGES/DRESSINGS) ×2 IMPLANT
DRSG PAD ABDOMINAL 8X10 ST (GAUZE/BANDAGES/DRESSINGS) IMPLANT
ELECT REM PT RETURN 9FT ADLT (ELECTROSURGICAL) ×2
ELECTRODE REM PT RTRN 9FT ADLT (ELECTROSURGICAL) ×1 IMPLANT
EVACUATOR SILICONE 100CC (DRAIN) IMPLANT
GAUZE SPONGE 4X4 12PLY STRL (GAUZE/BANDAGES/DRESSINGS) IMPLANT
GLOVE BIO SURGEON STRL SZ8 (GLOVE) ×2 IMPLANT
GLOVE BIOGEL PI IND STRL 8 (GLOVE) ×2 IMPLANT
GLOVE BIOGEL PI INDICATOR 8 (GLOVE) ×2
GLOVE ECLIPSE 7.5 STRL STRAW (GLOVE) ×2 IMPLANT
GOWN STRL REUS W/ TWL XL LVL3 (GOWN DISPOSABLE) ×2 IMPLANT
GOWN STRL REUS W/TWL XL LVL3 (GOWN DISPOSABLE) ×2
KIT BASIN OR (CUSTOM PROCEDURE TRAY) ×2 IMPLANT
KIT ROOM TURNOVER OR (KITS) ×2 IMPLANT
NS IRRIG 1000ML POUR BTL (IV SOLUTION) ×2 IMPLANT
PACK ORTHO EXTREMITY (CUSTOM PROCEDURE TRAY) ×2 IMPLANT
PAD ARMBOARD 7.5X6 YLW CONV (MISCELLANEOUS) ×4 IMPLANT
PAD CAST 4YDX4 CTTN HI CHSV (CAST SUPPLIES) IMPLANT
PADDING CAST COTTON 4X4 STRL (CAST SUPPLIES)
SET CYSTO W/LG BORE CLAMP LF (SET/KITS/TRAYS/PACK) ×2 IMPLANT
SOAP 2 % CHG 4 OZ (WOUND CARE) ×2 IMPLANT
SPONGE LAP 4X18 X RAY DECT (DISPOSABLE) ×2 IMPLANT
STAPLER VISISTAT 35W (STAPLE) IMPLANT
SUCTION FRAZIER HANDLE 10FR (MISCELLANEOUS) ×1
SUCTION TUBE FRAZIER 10FR DISP (MISCELLANEOUS) ×1 IMPLANT
SUT ETHILON 2 0 FS 18 (SUTURE) ×2 IMPLANT
SUT PROLENE 3 0 PS 2 (SUTURE) IMPLANT
SUT VIC AB 2-0 CT1 27 (SUTURE)
SUT VIC AB 2-0 CT1 TAPERPNT 27 (SUTURE) IMPLANT
SUT VIC AB 3-0 PS2 18 (SUTURE)
SUT VIC AB 3-0 PS2 18XBRD (SUTURE) IMPLANT
TOWEL OR 17X24 6PK STRL BLUE (TOWEL DISPOSABLE) ×2 IMPLANT
TOWEL OR 17X26 10 PK STRL BLUE (TOWEL DISPOSABLE) ×2 IMPLANT
TUBE CONNECTING 12X1/4 (SUCTIONS) ×2 IMPLANT
TUBING CYSTO DISP (UROLOGICAL SUPPLIES) ×2 IMPLANT
WATER STERILE IRR 1000ML POUR (IV SOLUTION) ×2 IMPLANT
YANKAUER SUCT BULB TIP NO VENT (SUCTIONS) ×2 IMPLANT

## 2016-01-11 NOTE — Op Note (Signed)
[name], [name] NO.:  [id number]  MEDICAL RECORD NO.:  [id number]  LOCATION:  5N14C                        FACILITY:  [facility]  PHYSICIAN:  Wylene Simmer, MD        DATE OF BIRTH:  [birth year]  DATE OF PROCEDURE:  01/11/2016 DATE OF DISCHARGE:                              OPERATIVE REPORT   PREOPERATIVE DIAGNOSES: 1. Left foot diabetic ulcer. 2. Retained hardware, left foot.  POSTOPERATIVE DIAGNOSES: 1. Left foot diabetic ulcer. 2. Retained hardware, left foot. 3. Left fifth metatarsal exostosis.  PROCEDURE: 1. Irrigation and excisional debridement of left diabetic foot ulcer     including skin, subcutaneous tissue, muscle, and bone. 2. Removal of deep implants from the left fifth metatarsal. 3. Left fifth metatarsal base exostectomy. 4. Application of wound VAC to the left foot diabetic ulcers measuring     1 cm x 1 cm x 1 cm deep and 1.5 cm x 1 cm x 1 cm deep.  SURGEON:  Wylene Simmer, MD.  ASSISTANT:  None.  ANESTHESIA:  General.  ESTIMATED BLOOD LOSS:  Minimal.  TOURNIQUET TIME:  8 minutes with an ankle Esmarch.  SPECIMENS:  Deep tissue to Microbiology for aerobic and anaerobic culture.  COMPLICATIONS:  None apparent.  DISPOSITION:  Extubated, awake, and stable to recovery.  INDICATION FOR PROCEDURE:  The patient is a 55 year old male with past medical history significant for diabetes.  About a year ago, he sustained a Jones fracture to the left fifth metatarsal base.  This went on to a nonunion requiring open reduction and internal fixation of the fracture.  Once he had healed adequately, he began bearing weight, and then developed a callus which then led to an ulcer beneath his fifth metatarsal base.  He has had local wound care now for the last several months trying to get this ulcer to heal.  He developed an infection last week and was seen in the emergency department at [facility].  He started on Bactrim and doxycycline.  He presents  today for removal of the deep implants and I and D of the wound with possible application of wound VAC.  He understands the risks and benefits, the alternative treatment options, and elects surgical treatment.  He specifically understands risks of bleeding, infection, nerve damage, blood clots, need for additional surgery, continued pain, amputation, and death.  PROCEDURE IN DETAIL:  After preoperative consent was obtained and the correct operative site was identified, the patient was brought to the operating room and placed supine on the operating table.  General anesthesia was induced.  Preoperative antibiotics were held.  A surgical time-out was taken.  Left lower extremity was prepped and draped in standard sterile fashion.  Foot was exsanguinated and an Esmarch tourniquet was wrapped around the ankle.  The patient was noted to have 2 ulcers on the foot, 1 at the plantar base of the fifth metatarsal and 1 at the lateral distal end of the previous surgical site.  There was some purulence noted at both areas.  Specimens were obtained and placed on the back table.  The previous incision was then opened sharply, dissection was carried down through skin and subcutaneous tissue.  Excisional debridement was then performed with a scalpel and rongeur circumferentially about the wound.  This was carried down through the skin, subcutaneous tissues, muscle, and down to the level of the bone. The plate was identified at the base of the wound.  All 3 screws were removed without difficulty, and the plate was removed in its entirety. Debridement was then carried down to the bone deep to the plate.  The fracture site appeared completely healed.  There was an exostosis present at the plantar base of the fifth metatarsal that corresponded to the location of the ulcer.  An oscillating saw was used to remove this exostosis in its entirety.  The wound was irrigated copiously with 6 L of normal saline.  2-0  nylon retention sutures were placed to close the distal 2/3rd of the wound.  At the proximal end, the original ulcer remained, it measured 1 cm x 1.5 cm x 1 cm deep.  The proximal end of the incision was unable to be closed completely, and this measured 1 cm x 1 cm x 1 cm deep.  Wound VAC sponges were cut to fit these 2 wounds and were placed in the wounds.  Mepitel was placed over the incision, followed by another wound VAC sponge.  Occlusive dressings were applied, followed by the wound VAC suction tubing.  The VAC was placed on continuous suction at 125 mmHg.  Appropriate seal was achieved.  Sterile dressings were applied, followed by compression wrap.  Vancomycin IV was administered after obtaining deep specimens for culture.  The patient was then awakened from anesthesia and transported to the recovery room in stable condition.  FOLLOWUP PLAN:  The patient will be weightbearing as tolerated on the left foot in a CAM walker boot.  He will be admitted for IV antibiotics pending the cultures.  We will anticipate him going home with a wound VAC as well.     Wylene Simmer, MD     JH/MEDQ  D:  01/11/2016  T:  01/11/2016  Job:  [id number]

## 2016-01-11 NOTE — Anesthesia Procedure Notes (Signed)
Procedure Name: LMA Insertion Date/Time: 01/11/2016 4:16 PM Performed by: Merdis Delay Pre-anesthesia Checklist: Patient identified, Emergency Drugs available, Suction available, Patient being monitored and Timeout performed Patient Re-evaluated:Patient Re-evaluated prior to inductionOxygen Delivery Method: Circle system utilized Preoxygenation: Pre-oxygenation with 100% oxygen Intubation Type: IV induction Ventilation: Mask ventilation without difficulty LMA: LMA inserted LMA Size: 5.0 Number of attempts: 1 Placement Confirmation: positive ETCO2,  CO2 detector and breath sounds checked- equal and bilateral Tube secured with: Tape Dental Injury: Teeth and Oropharynx as per pre-operative assessment

## 2016-01-11 NOTE — Progress Notes (Signed)
Orthopedic Tech Progress Note Patient Details:  [name] 10-23-1960 [id number]  Ortho Devices Type of Ortho Device: CAM walker Ortho Device/Splint Location: LLE Ortho Device/Splint Interventions: Ordered, Application   Braulio Bosch 01/11/2016, 8:11 PM

## 2016-01-11 NOTE — H&P (Signed)
[name] is an 55 y.o. male.   Chief Complaint:  Left foot ulcer HPI:  54 y/o male with left foot ulcer for the last several months.  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  CT scan shows healing of the nonunion.  He presents today for I and D and removal of hardware.  He may need a wound VAC placed depending on the wound after debridement.  He is currently taking Bactrim and doxycycline.  Past Medical History  Diagnosis Date  . Diabetes mellitus   . Osteomyelitis of toe of right foot ([place])   . Hypertension   . Arthritis   . Metatarsal bone fracture     left 5th toe  . BPH (benign prostatic hypertrophy)   . Post-operative infection     and diabetic ulcer left foot  . Diabetic neuropathy ([place])   . Wears glasses   . Sleep apnea      does not wear CPAP    Past Surgical History  Procedure Laterality Date  . Patella reconstruction Left 2005  . Prostate ablation  2014  . Toe amputation      partial amputation right great toe  . Orif toe fracture Left 06/08/2015    Procedure: OPEN REDUCTION INTERNAL FIXATION (ORIF) LEFT FIFTH METATARSAL BASE FRACTURE NONUNION; CALCANEAL AUTOGRAFT ;  Surgeon: Wylene Simmer, MD;  Location: [facility];  Service: Orthopedics;  Laterality: Left;  . Colonoscopy w/ biopsies and polypectomy      Family History  Problem Relation Age of Onset  . Diabetes Mother   . Diabetes Other    Social History:  reports that he has never smoked. He has never used smokeless tobacco. He reports that he drinks alcohol. He reports that he does not use illicit drugs.  Allergies:  Allergies  Allergen Reactions  . No Known Allergies     Medications Prior to Admission  Medication Sig Dispense Refill  . aspirin EC 81 MG tablet Take 81 mg by mouth daily.    Marland Kitchen docusate sodium (COLACE) 100 MG capsule Take 1 tablet once or twice daily as needed for constipation while taking narcotic pain medicine 30 capsule 0   . doxycycline (VIBRAMYCIN) 100 MG capsule Take 100 mg by mouth 2 (two) times daily. Started 7/9 for 10 days    . HYDROcodone-acetaminophen (NORCO/VICODIN) 5-325 MG tablet Take 1-2 tablets by mouth every 4 (four) hours as needed for moderate pain. 30 tablet 0  . insulin aspart (NOVOLOG) 100 UNIT/ML injection Inject 10-17 Units into the skin 3 (three) times daily before meals.     . Insulin Degludec (TRESIBA FLEXTOUCH) 100 UNIT/ML SOPN Inject 35 Units into the skin at bedtime.    Marland Kitchen losartan-hydrochlorothiazide (HYZAAR) 100-25 MG tablet Take 1 tablet by mouth daily.    . metFORMIN (GLUCOPHAGE) 1000 MG tablet Take 1,000 mg by mouth 2 (two) times daily.    . multivitamin-iron-minerals-folic acid (CENTRUM) chewable tablet Chew 1 tablet by mouth daily.    . simvastatin (ZOCOR) 20 MG tablet Take 20 mg by mouth daily.    Marland Kitchen sulfamethoxazole-trimethoprim (BACTRIM DS,SEPTRA DS) 800-160 MG tablet Take 1 tablet by mouth 2 (two) times daily. 20 tablet 0  . tamsulosin (FLOMAX) 0.4 MG CAPS capsule Take 0.4 mg by mouth daily.       Results for orders placed or performed during the hospital encounter of 01/11/16 (from the past 48 hour(s))  Glucose, capillary  Status: Abnormal   Collection Time: 2016/01/12  1:22 PM  Result Value Ref Range   Glucose-Capillary 132 (H) 65 - 99 mg/dL  Glucose, capillary     Status: Abnormal   Collection Time: 12-Jan-2016  3:23 PM  Result Value Ref Range   Glucose-Capillary 123 (H) 65 - 99 mg/dL   No results found.  ROS  No recent f/c/n/v/wt loss  Blood pressure 158/66, pulse 89, temperature 98.1 F (36.7 C), resp. rate 20, height 5\' 10"  (1.778 m), weight 117.935 kg (260 lb), SpO2 99 %. Physical Exam  wn wd male in nad.  A and O x 4.  Mood and affect normal.  EOMI.  Resp unlabored.  L foot with intact skin aside from an ulcer at the lateral 5th MT base.  5/5 strength in PF and DF of the ankle and toes.  Sens to LT diminished at the forefoot.  Assessment/Plan Left foot  diabetic ulcer with retained hardware - to OR for removal of deep implants and I and D of the ulcer with possible wound VAC placement.    The risks and benefits of the alternative treatment options have been discussed in detail.  The patient wishes to proceed with surgery and specifically understands risks of bleeding, infection, nerve damage, blood clots, need for additional surgery, amputation and death.   Wylene Simmer, MD 12-Jan-2016, 3:56 PM

## 2016-01-11 NOTE — Anesthesia Preprocedure Evaluation (Addendum)
Anesthesia Evaluation  Patient identified by MRN, date of birth, ID band Patient awake    Reviewed: Allergy & Precautions, NPO status , Patient's Chart, lab work & pertinent test results  Airway Mallampati: II  TM Distance: >3 FB Neck ROM: Full    Dental  (+) Dental Advisory Given   Pulmonary sleep apnea ,    breath sounds clear to auscultation       Cardiovascular hypertension, Pt. on medications  Rhythm:Regular Rate:Normal     Neuro/Psych negative neurological ROS     GI/Hepatic negative GI ROS, Neg liver ROS,   Endo/Other  diabetes, Type 2, Insulin Dependent  Renal/GU negative Renal ROS     Musculoskeletal  (+) Arthritis ,   Abdominal   Peds  Hematology negative hematology ROS (+)   Anesthesia Other Findings   Reproductive/Obstetrics                            Lab Results  Component Value Date   WBC 9.4 01/07/2016   HGB 13.5 01/07/2016   HCT 39.1* 01/07/2016   MCV 91.8 01/07/2016   PLT 288 01/07/2016   Lab Results  Component Value Date   CREATININE 0.84 01/07/2016   BUN 22* 01/07/2016   NA 135 01/07/2016   K 4.3 01/07/2016   CL 100* 01/07/2016   CO2 26 01/07/2016    Anesthesia Physical Anesthesia Plan  ASA: II  Anesthesia Plan: General   Post-op Pain Management:    Induction: Intravenous  Airway Management Planned: LMA  Additional Equipment:   Intra-op Plan:   Post-operative Plan: Extubation in OR  Informed Consent: I have reviewed the patients History and Physical, chart, labs and discussed the procedure including the risks, benefits and alternatives for the proposed anesthesia with the patient or authorized representative who has indicated his/her understanding and acceptance.   Dental advisory given  Plan Discussed with: CRNA  Anesthesia Plan Comments:         Anesthesia Quick Evaluation

## 2016-01-11 NOTE — Transfer of Care (Signed)
Immediate Anesthesia Transfer of Care Note  Patient: [name]  Procedure(s) Performed: Procedure(s): LEFT FOOT IRRIGATION AND DEBRIDEMENT WOUND VAC AND REMOVAL OF HARDWARE (Left)  Patient Location: PACU  Anesthesia Type:General  Level of Consciousness: awake, alert  and oriented  Airway & Oxygen Therapy: Patient Spontanous Breathing  Post-op Assessment: Report given to RN and Post -op Vital signs reviewed and stable  Post vital signs: Reviewed and stable  Last Vitals:  Filed Vitals:   01/11/16 1337  BP: 158/66  Pulse: 89  Temp: 36.7 C  Resp: 20    Last Pain: There were no vitals filed for this visit.    Patients Stated Pain Goal: 5 ([id number])  Complications: No apparent anesthesia complications

## 2016-01-11 NOTE — Brief Op Note (Signed)
01/11/2016  5:38 PM  PATIENT:  [name]  55 y.o. male  PRE-OPERATIVE DIAGNOSIS: 1.  Left foot diabetic ulcer      2.  Retained hardware left foot  POST-OPERATIVE DIAGNOSIS:   1.  Left foot diabetic ulcer      2.  Retained hardware left foot      3.  Left 5th metatarsal exostosis   Procedure(s): 1.  Irrigation and excisional debridement of left diabetic foot ulcer including skin, subcutaneous tissue, muscle and bone   2.  Removal of deep implants from the left 5th MT   3.  Left 5th MT base exostectomy   4.  Application of wound VAC to left foot diabetic ulcers 1 cm x 1 cm x 1 cm and 1.5 cm x 1 cm x 1 cm  SURGEON:  Wylene Simmer, MD  ASSISTANT: n/a  ANESTHESIA:   General  EBL:  minimal   TOURNIQUET:   Total Tourniquet Time Documented: Calf (Left) - 8 minutes Total: Calf (Left) - 8 minutes  SPECIMENS:  Deep tissue to micro for aerobic and anaerobic culture  COMPLICATIONS:  None apparent  DISPOSITION:  Extubated, awake and stable to recovery.  DICTATION ID:  [id number]

## 2016-01-12 ENCOUNTER — Encounter (HOSPITAL_COMMUNITY): Payer: Self-pay | Admitting: Orthopedic Surgery

## 2016-01-12 DIAGNOSIS — L0889 Other specified local infections of the skin and subcutaneous tissue: Secondary | ICD-10-CM

## 2016-01-12 DIAGNOSIS — L089 Local infection of the skin and subcutaneous tissue, unspecified: Secondary | ICD-10-CM

## 2016-01-12 DIAGNOSIS — M84375A Stress fracture, left foot, initial encounter for fracture: Secondary | ICD-10-CM | POA: Diagnosis present

## 2016-01-12 DIAGNOSIS — L97529 Non-pressure chronic ulcer of other part of left foot with unspecified severity: Secondary | ICD-10-CM

## 2016-01-12 DIAGNOSIS — E11628 Type 2 diabetes mellitus with other skin complications: Secondary | ICD-10-CM | POA: Diagnosis present

## 2016-01-12 DIAGNOSIS — E11621 Type 2 diabetes mellitus with foot ulcer: Secondary | ICD-10-CM

## 2016-01-12 DIAGNOSIS — B9689 Other specified bacterial agents as the cause of diseases classified elsewhere: Secondary | ICD-10-CM

## 2016-01-12 LAB — HEMOGLOBIN A1C
HEMOGLOBIN A1C: 8.5 % — AB (ref 4.8–5.6)
MEAN PLASMA GLUCOSE: 197 mg/dL

## 2016-01-12 LAB — GLUCOSE, CAPILLARY
GLUCOSE-CAPILLARY: 211 mg/dL — AB (ref 65–99)
GLUCOSE-CAPILLARY: 109 mg/dL — AB (ref 65–99)
GLUCOSE-CAPILLARY: 101 mg/dL — AB (ref 65–99)
Glucose-Capillary: 116 mg/dL — ABNORMAL HIGH (ref 65–99)

## 2016-01-12 MED ORDER — DEXTROSE 5 % IV SOLN
1.0000 g | INTRAVENOUS | Status: DC
  Administered 2016-01-12: 1 g via INTRAVENOUS
  Filled 2016-01-12 (×2): qty 10

## 2016-01-12 MED ORDER — SENNA 8.6 MG PO TABS: 2.0000 | ORAL_TABLET | Freq: Two times a day (BID) | ORAL | Status: DC

## 2016-01-12 MED ORDER — OXYCODONE HCL 5 MG PO TABS: 5.0000 mg | ORAL_TABLET | ORAL | Status: DC | PRN

## 2016-01-12 MED ORDER — DOCUSATE SODIUM 100 MG PO CAPS: 100.0000 mg | ORAL_CAPSULE | Freq: Two times a day (BID) | ORAL | Status: DC

## 2016-01-12 NOTE — Discharge Instructions (Signed)
[name] Simmer, MD [place]  Please read the following information regarding your care after surgery.  Medications  You only need a prescription for the narcotic pain medicine (ex. oxycodone, Percocet, Norco).  All of the other medicines listed below are available over the counter. X acetominophen (Tylenol) 650 mg every 4-6 hours as you need for minor pain X oxycodone as prescribed for moderate to severe pain ?   Narcotic pain medicine (ex. oxycodone, Percocet, Vicodin) will cause constipation.  To prevent this problem, take the following medicines while you are taking any pain medicine. X docusate sodium (Colace) 100 mg twice a day X senna (Senokot) 2 tablets twice a day  Weight Bearing X Bear weight when you are able on your operated leg or foot in CAM boot.  Cast / Splint / Dressing X Keep your dressing clean and dry.  Dont put anything (coat hanger, pencil, etc) down inside of it.  If it gets damp, use a hair dryer on the cool setting to dry it.  If it gets soaked, call the office to schedule an appointment for a cast change.  After your dressing, cast or splint is removed; you may shower, but do not soak or scrub the wound.  Allow the water to run over it, and then gently pat it dry.  Swelling It is normal for you to have swelling where you had surgery.  To reduce swelling and pain, keep your toes above your nose for at least 3 days after surgery.  It may be necessary to keep your foot or leg elevated for several weeks.  If it hurts, it should be elevated.  Follow Up Call my office at [phone] when you are discharged from the hospital or surgery center to schedule an appointment to be seen two weeks after surgery.  Call my office at [phone] if you develop a fever >101.5 F, nausea, vomiting, bleeding from the surgical site or severe pain.

## 2016-01-12 NOTE — Care Management Note (Addendum)
Case Management Note  Patient Details  Name: ANTONIE BORJON MRN: [id number] Date of Birth: June 10, 1961  Subjective/Objective:                    Action/Plan: Plan is for patient to discharge home when medically stable with IV antibiotics and wound vac. CM met with the patient and provided him a list of [facility] agencies in the [place] area. He selected [facility]. CM spoke with Sharmon Revere with [facility] and they accepted the referral. CM also informed Pam with [facility] IV therapy for the home IV antibiotics. CM also called and informed Ricki with [facility] of the need for wound vac at discharge. Order form for vac placed on the chart for the MD. CM will continue to follow for further d/c needs.  Expected Discharge Date:                  Expected Discharge Plan:  [place]  In-House Referral:     Discharge planning Services  CM Consult  Post Acute Care Choice:  Home Health Choice offered to:  Patient  DME Arranged:    DME Agency:     HH Arranged:  RN, IV Antibiotics HH Agency:  [facility]  Status of Service:  In process, will continue to follow  If discussed at Long Length of Stay Meetings, dates discussed:    Additional Comments:  Pollie Friar, RN 01/12/2016, 11:12 AM

## 2016-01-12 NOTE — Progress Notes (Signed)
[facility]  Patient Status:   New pt for [facility] this admission  [facility] is providing the following services: [facility] will provide [facility] will support the home infusion/IV Rocephin for Mr. Bangerter at home. [facility] has communicated with planned weekend DC with Sharmon Revere, RN with [facility].  [facility] team will follow pt this weekend to support DC home.   If patient discharges after hours, please call [phone].   Larry Sierras 01/12/2016, 4:36 PM

## 2016-01-12 NOTE — Progress Notes (Signed)
Patient ID: [name], male   DOB: 06-26-1961, 55 y.o.   MRN: [id number]         [name] for Infectious Disease    Date of Admission:  01/11/2016          Reason for Consult: Diabetic foot infection    Referring Physician: Dr. Wylene Simmer  Principal Problem:   Diabetic infection of left foot [name]) Active Problems:   Diabetic ulcer of foot associated with type 2 diabetes mellitus, with necrosis of muscle ([name])   Metatarsal stress fracture of left foot   Diabetes mellitus due to underlying condition with diabetic polyneuropathy ([name])   . aspirin EC  81 mg Oral Daily  . losartan  100 mg Oral Daily   And  . hydrochlorothiazide  25 mg Oral Daily  . insulin aspart  0-15 Units Subcutaneous TID WC  . insulin detemir  35 Units Subcutaneous QHS  . metFORMIN  1,000 mg Oral BID WC  . simvastatin  20 mg Oral Daily  . tamsulosin  0.4 mg Oral Daily    Recommendations: 1. Start ceftriaxone pending operative culture results 2. PICC placement   Assessment: He has diabetic foot ulcers with wound infection. He has been quite frustrated because of the poor healing and has been out of work for the past year except for the one month that he tried to return this breathing. I talked to him about management options including an initial course of IV antibiotic and he is in agreement. He states that he wants to do anything he possibly can to heal the wounds so he can get back. I will start him on ceftriaxone to target the [name] and group B strep pending final cultures. His operative Gram stain showed gram-positive cocci in pairs.    HPI: [name] is a 55 y.o. male diabetic with peripheral neuropathy who suffered a left fifth metatarsal fracture sometime last year. The fracture site was not healing and in December he underwent open reduction and internal fixation with bone grafting. 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 He developed 2  ulcers on his left lateral foot. He was followed at the wound center. It appears that he has received several courses of oral antibiotics. He was treated with cephalexin after being seen in the ED in March. He had cultures obtained in May that grew group B strep and [name]. He was seen back in the ED on 01/07/2016 after pus started draining from the ulcers. He was started on doxycycline and trimethoprim sulfamethoxazole. CT scan was obtained and showed healing of the fracture site. There was no apparent osteomyelitis. He was readmitted to the hospital yesterday and had the hardware removed. Dr. Nona Dell note indicates that there was no obvious osteomyelitis.   Review of Systems: Review of Systems  Constitutional: Positive for malaise/fatigue. Negative for fever, chills, weight loss and diaphoresis.  Respiratory: Negative for cough, sputum production and shortness of breath.   Cardiovascular: Negative for chest pain.  Gastrointestinal: Negative for nausea, vomiting, abdominal pain and diarrhea.  Musculoskeletal: Positive for joint pain.  Neurological: Negative for headaches.    Past Medical History  Diagnosis Date  . Diabetes mellitus   . Osteomyelitis of toe of right foot ([place])   . Hypertension   . Arthritis   . Metatarsal bone fracture     left 5th toe  . BPH (benign prostatic hypertrophy)   . Post-operative infection  and diabetic ulcer left foot  . Diabetic neuropathy ([place])   . Wears glasses   . Sleep apnea      does not wear CPAP    Social History  Substance Use Topics  . Smoking status: Never Smoker   . Smokeless tobacco: Never Used  . Alcohol Use: Yes     Comment: occasional     Family History  Problem Relation Age of Onset  . Diabetes Mother   . Diabetes Other    Allergies  Allergen Reactions  . No Known Allergies     OBJECTIVE: Blood pressure 144/89, pulse 85, temperature 98.2 F (36.8 C), temperature source Oral, resp. rate 17, height 5\' 10"  (1.778  m), weight 260 lb (117.935 kg), SpO2 97 %.  Physical Exam  Constitutional: He is oriented to person, place, and time.  He is alert and in no distress resting quietly in bed and watching television.  Cardiovascular: Normal rate and regular rhythm.   No murmur heard. Pulmonary/Chest: Effort normal and breath sounds normal.  Abdominal: Soft. There is no tenderness.  Musculoskeletal:  His left foot and ankle are wrapped.  Neurological: He is alert and oriented to person, place, and time.  Skin: No rash noted.  Psychiatric: Mood and affect normal.    Lab Results Lab Results  Component Value Date   WBC 9.4 01/07/2016   HGB 13.5 01/07/2016   HCT 39.1* 01/07/2016   MCV 91.8 01/07/2016   PLT 288 01/07/2016    Lab Results  Component Value Date   CREATININE 0.84 01/07/2016   BUN 22* 01/07/2016   NA 135 01/07/2016   K 4.3 01/07/2016   CL 100* 01/07/2016   CO2 26 01/07/2016    Lab Results  Component Value Date   ALT 25 10/29/2012   AST 22 10/29/2012   ALKPHOS 111 10/29/2012   BILITOT 0.3 10/29/2012     Microbiology: Recent Results (from the past 240 hour(s))  Aerobic/Anaerobic Culture (surgical/deep wound)     Status: None (Preliminary result)   Collection Time: 01/11/16  4:52 PM  Result Value Ref Range Status   Specimen Description TISSUE LEFT FOOT  Final   Special Requests NONE  Final   Gram Stain   Final    ABUNDANT WBC PRESENT, PREDOMINANTLY MONONUCLEAR ABUNDANT GRAM POSITIVE COCCI IN PAIRS    Culture PENDING  Incomplete   Report Status PENDING  Incomplete    Michel Bickers, MD [name] [phone] pager   [phone] cell 01/12/2016, 11:09 AM

## 2016-01-12 NOTE — Progress Notes (Signed)
Subjective: 1 Day Post-Op Procedure(s) (LRB): LEFT FOOT IRRIGATION AND DEBRIDEMENT WOUND VAC AND REMOVAL OF HARDWARE (Left)  Patient reports pain as mild to moderate.  Tolerating POs well.  Denies fever, chills, N/V.  Denies BM, however admits to flatulence.  Reports that he is doing well this am.  Objective:   VITALS:  Temp:  [97.7 F (36.5 C)-98.2 F (36.8 C)] 98.2 F (36.8 C) (07/14 0514) Pulse Rate:  [85-97] 85 (07/14 0514) Resp:  [16-22] 17 (07/14 0514) BP: (126-160)/(66-93) 144/89 mmHg (07/14 0514) SpO2:  [92 %-100 %] 97 % (07/14 0514) Weight:  [117.935 kg (260 lb)] 117.935 kg (260 lb) (07/13 1337)  General: WDWN patient in NAD. Psych:  Appropriate mood and affect. Neuro:  A&O x 3, Moving all extremities, sensation intact to light touch HEENT:  EOMs intact Chest:  Even non-labored respirations Skin:  Dressing C/D/I, no rashes or lesions.  Wound vac intact draining serosangeonous fluid. Extremities: warm/dry, no visible edema, erythema, or echymosis.  No lymphadenopathy. Pulses: Popliteus 2+ MSK:  ROM: EHL/FHL intact, MMT: patient can perform quad set, (-) Homan's    LABS No results for input(s): HGB, WBC, PLT in the last 72 hours. No results for input(s): NA, K, CL, CO2, BUN, CREATININE, GLUCOSE in the last 72 hours. No results for input(s): LABPT, INR in the last 72 hours.   Assessment/Plan: 1 Day Post-Op Procedure(s) (LRB): LEFT FOOT IRRIGATION AND DEBRIDEMENT WOUND VAC AND REMOVAL OF HARDWARE (Left)  WBAT LLE in CAM boot. Cultures pending for ID recommendation for D/C ABX Plan for patient to be D/C'd with wound vac. Plan for 2 week outpatient post-op visit with Dr. Doran Durand.  Mechele Claude, PA-C, [employer] Office:  [phone]

## 2016-01-13 ENCOUNTER — Inpatient Hospital Stay (HOSPITAL_COMMUNITY): Payer: Managed Care, Other (non HMO)

## 2016-01-13 DIAGNOSIS — E1140 Type 2 diabetes mellitus with diabetic neuropathy, unspecified: Secondary | ICD-10-CM

## 2016-01-13 DIAGNOSIS — T847XXA Infection and inflammatory reaction due to other internal orthopedic prosthetic devices, implants and grafts, initial encounter: Secondary | ICD-10-CM | POA: Insufficient documentation

## 2016-01-13 DIAGNOSIS — E11621 Type 2 diabetes mellitus with foot ulcer: Secondary | ICD-10-CM

## 2016-01-13 DIAGNOSIS — T8469XD Infection and inflammatory reaction due to internal fixation device of other site, subsequent encounter: Secondary | ICD-10-CM

## 2016-01-13 DIAGNOSIS — L97523 Non-pressure chronic ulcer of other part of left foot with necrosis of muscle: Secondary | ICD-10-CM

## 2016-01-13 DIAGNOSIS — Y793 Surgical instruments, materials and orthopedic devices (including sutures) associated with adverse incidents: Secondary | ICD-10-CM

## 2016-01-13 DIAGNOSIS — Z8619 Personal history of other infectious and parasitic diseases: Secondary | ICD-10-CM

## 2016-01-13 DIAGNOSIS — B951 Streptococcus, group B, as the cause of diseases classified elsewhere: Secondary | ICD-10-CM

## 2016-01-13 LAB — GLUCOSE, CAPILLARY
GLUCOSE-CAPILLARY: 87 mg/dL (ref 65–99)
GLUCOSE-CAPILLARY: 101 mg/dL — AB (ref 65–99)
GLUCOSE-CAPILLARY: 150 mg/dL — AB (ref 65–99)
Glucose-Capillary: 108 mg/dL — ABNORMAL HIGH (ref 65–99)

## 2016-01-13 MED ORDER — SODIUM CHLORIDE 0.9% FLUSH
10.0000 mL | Freq: Two times a day (BID) | INTRAVENOUS | Status: DC
  Administered 2016-01-13 – 2016-01-14 (×3): 10 mL

## 2016-01-13 MED ORDER — DEXTROSE 5 % IV SOLN
2.0000 g | INTRAVENOUS | Status: DC
  Administered 2016-01-13 – 2016-01-15 (×3): 2 g via INTRAVENOUS
  Filled 2016-01-13 (×3): qty 2

## 2016-01-13 MED ORDER — SODIUM CHLORIDE 0.9% FLUSH
10.0000 mL | INTRAVENOUS | Status: DC | PRN
  Administered 2016-01-15: 10 mL
  Filled 2016-01-13: qty 40

## 2016-01-13 NOTE — Progress Notes (Signed)
Subjective: No new complaints   Antibiotics:  Anti-infectives    Start     Dose/Rate Route Frequency Ordered Stop   01/13/16 1200  cefTRIAXone (ROCEPHIN) 2 g in dextrose 5 % 50 mL IVPB     2 g 100 mL/hr over 30 Minutes Intravenous Every 24 hours 01/13/16 1035     01/12/16 1200  cefTRIAXone (ROCEPHIN) 1 g in dextrose 5 % 50 mL IVPB  Status:  Discontinued     1 g 100 mL/hr over 30 Minutes Intravenous Every 24 hours 01/12/16 1117 01/13/16 1035      Medications: Scheduled Meds: . aspirin EC  81 mg Oral Daily  . cefTRIAXone (ROCEPHIN)  IV  2 g Intravenous Q24H  . losartan  100 mg Oral Daily   And  . hydrochlorothiazide  25 mg Oral Daily  . insulin aspart  0-15 Units Subcutaneous TID WC  . insulin detemir  35 Units Subcutaneous QHS  . metFORMIN  1,000 mg Oral BID WC  . simvastatin  20 mg Oral Daily  . sodium chloride flush  10-40 mL Intracatheter Q12H  . tamsulosin  0.4 mg Oral Daily   Continuous Infusions: . sodium chloride 75 mL/hr at 01/12/16 0408   PRN Meds:.acetaminophen **OR** acetaminophen, ondansetron **OR** ondansetron (ZOFRAN) IV, oxyCODONE, sodium chloride flush    Objective: Weight change:   Intake/Output Summary (Last 24 hours) at 01/13/16 1727 Last data filed at 01/13/16 1155  Gross per 24 hour  Intake     10 ml  Output    925 ml  Net   -915 ml   Blood pressure 142/78, pulse 84, temperature 98.5 F (36.9 C), temperature source Oral, resp. rate 18, height 5\' 10"  (1.778 m), weight 260 lb (117.935 kg), SpO2 98 %. Temp:  [98.2 F (36.8 C)-98.7 F (37.1 C)] 98.5 F (36.9 C) (07/15 1512) Pulse Rate:  [84-91] 84 (07/15 1512) Resp:  [16-18] 18 (07/15 1512) BP: (124-142)/(75-82) 142/78 mmHg (07/15 1512) SpO2:  [98 %-99 %] 98 % (07/15 1512)  Physical Exam: General: Alert and awake, oriented x3, not in any acute distress. HEENT: anicteric sclera, pupils reactive to light and accommodation, EOMI CVS regular rate, normal r,   Chest:  no wheezing,  rales or rhonchi MSK: skin: left foot with dressin  Neuro: nonfocal  CBC:   BMET No results for input(s): NA, K, CL, CO2, GLUCOSE, BUN, CREATININE, CALCIUM in the last 72 hours.   Liver Panel  No results for input(s): PROT, ALBUMIN, AST, ALT, ALKPHOS, BILITOT, BILIDIR, IBILI in the last 72 hours.     Sedimentation Rate No results for input(s): ESRSEDRATE in the last 72 hours. C-Reactive Protein No results for input(s): CRP in the last 72 hours.  Micro Results: Recent Results (from the past 720 hour(s))  Aerobic/Anaerobic Culture (surgical/deep wound)     Status: None (Preliminary result)   Collection Time: 01/11/16  4:52 PM  Result Value Ref Range Status   Specimen Description TISSUE LEFT FOOT  Final   Special Requests NONE  Final   Gram Stain   Final    ABUNDANT WBC PRESENT, PREDOMINANTLY MONONUCLEAR ABUNDANT GRAM POSITIVE COCCI IN PAIRS    Culture   Final    MODERATE GROUP B STREP(S.AGALACTIAE)ISOLATED TESTING AGAINST S. AGALACTIAE NOT ROUTINELY PERFORMED DUE TO PREDICTABILITY OF AMP/PEN/VAN SUSCEPTIBILITY. FEW ENTEROBACTER SPECIES CULTURE REINCUBATED FOR BETTER GROWTH    Report Status PENDING  Incomplete   Organism ID, Bacteria ENTEROBACTER SPECIES  Final  Susceptibility   Enterobacter species - MIC*    CEFAZOLIN >=64 RESISTANT Resistant     CEFEPIME <=1 SENSITIVE Sensitive     CEFTAZIDIME <=1 SENSITIVE Sensitive     CEFTRIAXONE <=1 SENSITIVE Sensitive     CIPROFLOXACIN <=0.25 SENSITIVE Sensitive     GENTAMICIN <=1 SENSITIVE Sensitive     IMIPENEM 0.5 SENSITIVE Sensitive     TRIMETH/SULFA <=20 SENSITIVE Sensitive     PIP/TAZO <=4 SENSITIVE Sensitive     * FEW ENTEROBACTER SPECIES    Studies/Results: No results found.    Assessment/Plan: INTERVAL HISTORY:   Cultures growing GBS and GNR   Principal Problem:   Diabetic infection of left foot (HCC) Active Problems:   Diabetic ulcer of foot associated with type 2 diabetes mellitus, with  necrosis of muscle (H[facility])   Metatarsal stress fracture of left foot   Diabetes mellitus due to underlying condition with diabetic polyneuropathy ([facility])    [name] is a 55 y.o. male with  diabetic neuropathy who suffered a left fifth metatarsal fracture sometime last year. He underwent plate and screw fixation in December then developed 2 ulcers and wound infection over the plate. He was taken to OR by Dr. Doran Durand who performed               1.  I and D of left DFU including skin, subcutaneous tissue, muscle and bone 2. Removal of deep implants from the left 5th MT 3. Left 5th MT base exostectomy 4. Application of wound VAC to left foot diabetic ulcers 1 cm x 1 cm x 1 cm and 1.5 cm x 1 cm x 1 cm  By CT and per Dr. Nona Dell description in the OR bone was not involved   He is now growing GBS and a GNR from culture. Previously he grew Citrobacter from wound culture from the wound  I had  Extensive discussion with the patient and his wife and it sounds as if they would prefer a more aggressive duration of IV antibiotics  Therefore I will plan on 6 week course (currently on track to be with Ceftriaxone which I have upped to 2 grams daily) depending on ID of the GNR  His DM needs to be better controlled and he clearly needs offloading and frequent foot exams in the near future   Will engage remainder of DFU order set and see patient in am          LOS: 2 days   Alcide Evener 01/13/2016, 5:27 PM

## 2016-01-13 NOTE — Progress Notes (Signed)
VASCULAR LAB PRELIMINARY  ARTERIAL  ABI completed:ABIs are within normal limits.     RIGHT    LEFT    PRESSURE WAVEFORM  PRESSURE WAVEFORM  BRACHIAL PICC  BRACHIAL 152 T  DP   DP    AT 188 B AT 211 B  PT 198 B PT 186 B  PER   PER    GREAT TOE  NA GREAT TOE  NA    RIGHT LEFT  ABI 1.3 1.39     Nakari Bracknell, RVT 01/13/2016, 6:41 PM

## 2016-01-13 NOTE — Progress Notes (Signed)
Subjective: 2 Days Post-Op Procedure(s) (LRB): LEFT FOOT IRRIGATION AND DEBRIDEMENT WOUND VAC AND REMOVAL OF HARDWARE (Left)  Patient reports pain as mild to moderate.  Tolerating POs well.  Denies fever, chills, N/V.  Denies BM, however admits to flatulence.  Reports that he is doing well this am.  Objective:   VITALS:  Temp:  [98.2 F (36.8 C)-98.7 F (37.1 C)] 98.7 F (37.1 C) (07/15 0647) Pulse Rate:  [85-91] 87 (07/15 0805) Resp:  [16-18] 18 (07/15 0647) BP: (124-152)/(75-82) 141/82 mmHg (07/15 0805) SpO2:  [96 %-99 %] 99 % (07/15 0647)  General: WDWN patient in NAD. Psych:  Appropriate mood and affect. Neuro:  A&O x 3, Moving all extremities, sensation intact to light touch HEENT:  EOMs intact Chest:  Even non-labored respirations Skin:  Dressing C/D/I, no rashes or lesions.  Wound vac intact draining serosangeonous fluid. Extremities: warm/dry, no visible edema, erythema, or echymosis.  No lymphadenopathy. Pulses: Popliteus 2+ MSK:  ROM: EHL/FHL intact, MMT: patient can perform quad set, (-) Homan's   Results for orders placed or performed during the hospital encounter of 01/11/16  Aerobic/Anaerobic Culture (surgical/deep wound)     Status: None (Preliminary result)   Collection Time: 01/11/16  4:52 PM  Result Value Ref Range Status   Specimen Description TISSUE LEFT FOOT  Final   Special Requests NONE  Final   Gram Stain   Final    ABUNDANT WBC PRESENT, PREDOMINANTLY MONONUCLEAR ABUNDANT GRAM POSITIVE COCCI IN PAIRS    Culture   Final    MODERATE GROUP B STREP(S.AGALACTIAE)ISOLATED TESTING AGAINST S. AGALACTIAE NOT ROUTINELY PERFORMED DUE TO PREDICTABILITY OF AMP/PEN/VAN SUSCEPTIBILITY. FEW GRAM NEGATIVE RODS    Report Status PENDING  Incomplete     LABS No results for input(s): HGB, WBC, PLT in the last 72 hours. No results for input(s): NA, K, CL, CO2, BUN, CREATININE, GLUCOSE in the last 72 hours. No results for input(s): LABPT, INR in the last 72  hours.   Assessment/Plan: 2 Days Post-Op Procedure(s) (LRB): LEFT FOOT IRRIGATION AND DEBRIDEMENT WOUND VAC AND REMOVAL OF HARDWARE (Left)  WBAT LLE in CAM boot. Cultures pending for ID recommendation for D/C ABX Plan for patient to be D/C'd with wound vac. Wound VAC change on Monday Plan for 2 week outpatient post-op visit with Dr. Doran Durand.

## 2016-01-13 NOTE — Progress Notes (Addendum)
Peripherally Inserted Central Catheter/Midline Placement  The IV Nurse has discussed with the patient and/or persons authorized to consent for the patient, the purpose of this procedure and the potential benefits and risks involved with this procedure.  The benefits include less needle sticks, lab draws from the catheter, ability to perform PICC exchange if ordered by the physician and patient may be discharged home with the catheter.  Risks include, but not limited to, infection, bleeding, blood clot (thrombus formation), and puncture of an artery; nerve damage and irregular heat beat.  Alternatives to this procedure were also discussed.  Bard educational information left with pt.  PICC/Midline Placement Documentation  PICC Single Lumen 01/13/16 PICC Right Cephalic 40 cm 0 cm (Active)  Indication for Insertion or Continuance of Line Home intravenous therapies (PICC only) 01/13/2016  8:48 AM  Exposed Catheter (cm) 0 cm 01/13/2016  8:48 AM  Site Assessment Clean;Dry;Intact 01/13/2016  8:48 AM  Line Status Flushed;Saline locked;Blood return noted 01/13/2016  8:48 AM  Dressing Type Transparent 01/13/2016  8:48 AM  Dressing Status Clean;Dry;Intact 01/13/2016  8:48 AM  Dressing Change Due 01/20/16 01/13/2016  8:48 AM       Gordan Payment 01/13/2016, 8:49 AM

## 2016-01-14 DIAGNOSIS — Z419 Encounter for procedure for purposes other than remedying health state, unspecified: Secondary | ICD-10-CM | POA: Insufficient documentation

## 2016-01-14 LAB — PREALBUMIN: PREALBUMIN: 25.4 mg/dL (ref 18–38)

## 2016-01-14 LAB — GLUCOSE, CAPILLARY
GLUCOSE-CAPILLARY: 95 mg/dL (ref 65–99)
GLUCOSE-CAPILLARY: 106 mg/dL — AB (ref 65–99)
GLUCOSE-CAPILLARY: 105 mg/dL — AB (ref 65–99)
GLUCOSE-CAPILLARY: 128 mg/dL — AB (ref 65–99)

## 2016-01-14 LAB — HIV ANTIBODY (ROUTINE TESTING W REFLEX): HIV Screen 4th Generation wRfx: NONREACTIVE

## 2016-01-14 NOTE — Progress Notes (Signed)
Orthopedics Progress Note  Subjective: No pain in the foot  Objective:  Filed Vitals:   01/13/16 2100 01/14/16 0613  BP: 132/86 128/77  Pulse: 94 89  Temp: 97.7 F (36.5 C)   Resp:      General: Awake and alert  Musculoskeletal: Left foot wound vac in place and dressing intact (vac functioning properly) Neurovascularly intact  Lab Results  Component Value Date   WBC 9.4 01/07/2016   HGB 13.5 01/07/2016   HCT 39.1* 01/07/2016   MCV 91.8 01/07/2016   PLT 288 01/07/2016       Component Value Date/Time   NA 135 01/07/2016 0829   NA 134* 03/26/2014 2050   K 4.3 01/07/2016 0829   K 4.3 03/26/2014 2050   CL 100* 01/07/2016 0829   CL 103 03/26/2014 2050   CO2 26 01/07/2016 0829   CO2 29 03/26/2014 2050   GLUCOSE 198* 01/07/2016 0829   GLUCOSE 150* 03/26/2014 2050   BUN 22* 01/07/2016 0829   BUN 18 03/26/2014 2050   CREATININE 0.84 01/07/2016 0829   CREATININE 0.86 03/26/2014 2050   CALCIUM 9.0 01/07/2016 0829   CALCIUM 8.7 03/26/2014 2050   GFRNONAA >60 01/07/2016 0829   GFRNONAA >60 03/26/2014 2050   GFRNONAA >60 10/29/2012 1757   GFRAA >60 01/07/2016 0829   GFRAA >60 03/26/2014 2050   GFRAA >60 10/29/2012 1757    No results found for: INR, PROTIME  Assessment/Plan: POD #3 s/p Procedure(s): LEFT FOOT IRRIGATION AND DEBRIDEMENT WOUND VAC AND REMOVAL OF HARDWARE Patient remains on abx per ID, continue VAC and foot elevation WBAT in boot per Dr Doran Durand F/U with Doran Durand as outpatient after stable for D/C  Remo Lipps R. Veverly Fells, MD 01/14/2016 8:39 AM

## 2016-01-14 NOTE — Progress Notes (Signed)
Subjective: No new complaints   Antibiotics:  Anti-infectives    Start     Dose/Rate Route Frequency Ordered Stop   01/13/16 1200  cefTRIAXone (ROCEPHIN) 2 g in dextrose 5 % 50 mL IVPB     2 g 100 mL/hr over 30 Minutes Intravenous Every 24 hours 01/13/16 1035     01/12/16 1200  cefTRIAXone (ROCEPHIN) 1 g in dextrose 5 % 50 mL IVPB  Status:  Discontinued     1 g 100 mL/hr over 30 Minutes Intravenous Every 24 hours 01/12/16 1117 01/13/16 1035      Medications: Scheduled Meds: . aspirin EC  81 mg Oral Daily  . cefTRIAXone (ROCEPHIN)  IV  2 g Intravenous Q24H  . losartan  100 mg Oral Daily   And  . hydrochlorothiazide  25 mg Oral Daily  . insulin aspart  0-15 Units Subcutaneous TID WC  . insulin detemir  35 Units Subcutaneous QHS  . metFORMIN  1,000 mg Oral BID WC  . simvastatin  20 mg Oral Daily  . sodium chloride flush  10-40 mL Intracatheter Q12H  . tamsulosin  0.4 mg Oral Daily   Continuous Infusions: . sodium chloride 75 mL/hr at 01/12/16 0408   PRN Meds:.acetaminophen **OR** acetaminophen, ondansetron **OR** ondansetron (ZOFRAN) IV, oxyCODONE, sodium chloride flush    Objective: Weight change:  No intake or output data in the 24 hours ending 01/14/16 1442 Blood pressure 119/79, pulse 96, temperature 97.7 F (36.5 C), temperature source Oral, resp. rate 18, height _0  (1.778 m), weight 260 lb (117.935 kg), SpO2 97 %. Temp:  [97.7 F (36.5 C)-98.5 F (36.9 C)] 97.7 F (36.5 C) (07/15 2100) Pulse Rate:  [84-96] 96 (07/16 0905) Resp:  [18] 18 (07/15 1512) BP: (119-142)/(77-86) 119/79 mmHg (07/16 0905) SpO2:  [97 %-99 %] 97 % (07/16 0905)  Physical Exam: General: Alert and awake, oriented x3, not in any acute distress. HEENT: anicteric sclera, pupils reactive to light and accommodation, EOMI CVS regular rate, normal r,   Chest:  no wheezing, rales or rhonchi MSK: skin: left foot with dressin  Neuro: nonfocal  CBC:   BMET No results for  input(s): NA, K, CL, CO2, GLUCOSE, BUN, CREATININE, CALCIUM in the last 72 hours.   Liver Panel  No results for input(s): PROT, ALBUMIN, AST, ALT, ALKPHOS, BILITOT, BILIDIR, IBILI in the last 72 hours.     Sedimentation Rate No results for input(s): ESRSEDRATE in the last 72 hours. C-Reactive Protein No results for input(s): CRP in the last 72 hours.  Micro Results: Recent Results (from the past 720 hour(s))  Aerobic/Anaerobic Culture (surgical/deep wound)     Status: None (Preliminary result)   Collection Time: 01/11/16  4:52 PM  Result Value Ref Range Status   Specimen Description TISSUE LEFT FOOT  Final   Special Requests NONE  Final   Gram Stain   Final    ABUNDANT WBC PRESENT, PREDOMINANTLY MONONUCLEAR ABUNDANT GRAM POSITIVE COCCI IN PAIRS    Culture   Final    MODERATE GROUP B STREP(S.AGALACTIAE)ISOLATED TESTING AGAINST S. AGALACTIAE NOT ROUTINELY PERFORMED DUE TO PREDICTABILITY OF AMP/PEN/VAN SUSCEPTIBILITY. FEW ENTEROBACTER SPECIES FEW ENTEROCOCCUS SPECIES    Report Status PENDING  Incomplete   Organism ID, Bacteria ENTEROBACTER SPECIES  Final   Organism ID, Bacteria ENTEROCOCCUS SPECIES  Final      Susceptibility   Enterobacter species - MIC*    CEFAZOLIN >=64 RESISTANT Resistant     CEFEPIME <=1 SENSITIVE Sensitive  CEFTAZIDIME <=1 SENSITIVE Sensitive     CEFTRIAXONE <=1 SENSITIVE Sensitive     CIPROFLOXACIN <=0.25 SENSITIVE Sensitive     GENTAMICIN <=1 SENSITIVE Sensitive     IMIPENEM 0.5 SENSITIVE Sensitive     TRIMETH/SULFA <=20 SENSITIVE Sensitive     PIP/TAZO <=4 SENSITIVE Sensitive     * FEW ENTEROBACTER SPECIES   Enterococcus species - MIC*    AMPICILLIN <=2 SENSITIVE Sensitive     VANCOMYCIN 1 SENSITIVE Sensitive     GENTAMICIN SYNERGY SENSITIVE Sensitive     * FEW ENTEROCOCCUS SPECIES    Studies/Results: No results found.    Assessment/Plan: INTERVAL HISTORY:    01/12/16: Cultures growing GBS and GNR 01/13/16: "" GBS and  Enterobacter   Principal Problem:   Diabetic infection of left foot (HCC) Active Problems:   Diabetic ulcer of foot associated with type 2 diabetes mellitus, with necrosis of muscle (HCC)   Metatarsal stress fracture of left foot   Diabetes mellitus due to underlying condition with diabetic polyneuropathy ([place])   Hardware complicating wound infection ([place])   Diabetes mellitus due to underlying condition, uncontrolled, with diabetic neuropathy ([place])    [name] is a 55 y.o. male with  diabetic neuropathy who suffered a left fifth metatarsal fracture sometime last year. He underwent plate and screw fixation in December then developed 2 ulcers and wound infection over the plate. He was taken to OR by Dr. Doran Durand who performed               1.  I and D of left DFU including skin, subcutaneous tissue, muscle and bone 2. Removal of deep implants from the left 5th MT 3. Left 5th MT base exostectomy 4. Application of wound VAC to left foot diabetic ulcers 1 cm x 1 cm x 1 cm and 1.5 cm x 1 cm x 1 cm  By CT and per Dr. Nona Dell description in the OR bone was not involved   He is now growing GBS and a Enterobacter from culture.   See prior note. We will embark on an aggressive 6 week IV antibiotic course.  I'll make sure the patient follows alerted to our ID pharmacy team and that he has FU  with ID MD in next month  Diagnosis:  DFU with hardware involvement  Culture Result: GBS and Enterobacter  Allergies  Allergen Reactions  . No Known Allergies     Discharge antibiotics: Ceftriaxone 2 g daily   Duration: 6 weeks  End Date:  02/21/16  [facility] Care Per Protocol:  Labs weekly while on IV antibiotics: x__ CBC with differential x__ CMP x__ CRP _x_ ESR   Fax weekly labs to [phone]  Clinic Follow Up Appt:  Next 4 weeks  I will otherwise sign off, please call with questions.            LOS: 3 days   Alcide Evener 01/14/2016, 2:42 PM

## 2016-01-14 NOTE — Progress Notes (Signed)
Contacted doctor about patient's discharge plan yesterday.  They informed me discharge would occur Monday.  Contacted Doctor today about [employer] form for [employer].  Form is on patient's chart and needs to be signed by doctor.

## 2016-01-15 LAB — GLUCOSE, CAPILLARY
GLUCOSE-CAPILLARY: 133 mg/dL — AB (ref 65–99)
Glucose-Capillary: 113 mg/dL — ABNORMAL HIGH (ref 65–99)

## 2016-01-15 LAB — AEROBIC/ANAEROBIC CULTURE W GRAM STAIN (SURGICAL/DEEP WOUND)

## 2016-01-15 LAB — AEROBIC/ANAEROBIC CULTURE (SURGICAL/DEEP WOUND)

## 2016-01-15 MED ORDER — HEPARIN SOD (PORK) LOCK FLUSH 100 UNIT/ML IV SOLN
250.0000 [IU] | INTRAVENOUS | Status: AC | PRN
  Administered 2016-01-15: 250 [IU]

## 2016-01-15 MED ORDER — DEXTROSE 5 % IV SOLN: 2.0000 g | INTRAVENOUS | Status: DC

## 2016-01-15 NOTE — Consult Note (Addendum)
[facility] Nurse wound consult note Reason for Consult: Consult requested for left foot vac dressing change.  Ortho service PA at the bedside to assess the wound during the first post-op dressing change. Wound type: 2 areas of full thickness wounds to left outer foot, surrounded by sutures which are intact and well-approximated. Measurement: .3X.3X.3cm and .8X1X.2cm Wound bed: beefy red wound bed Drainage (amount, consistency, odor) small amt bloody drainage when previous sponges removed Periwound: Intact skin surrounding Dressing procedure/placement/frequency: Applied one piece black foam to each wound, then bridged together with another piece of sponge after the drape was applied.  Mepitel contact layer applied over suture line to protect.  Applied ace wrap over dressing; cont suction on at 13mm.  Pt medicated for pain prior to procedure and tolerated with minimal amt discomfort.  Plan for home health to change dressing 3 times a week. Please re-consult if further assistance is needed.  Thank-you,  Julien Girt MSN, [place], [place], [place], [place]

## 2016-01-15 NOTE — Progress Notes (Signed)
[name] to be D/C'd Home per MD order.  Discussed with the patient and all questions fully answered. Patient's RN Althia Forts stated okay for patient to discharge.   An After Visit Summary was printed and given to the patient. Patient received prescription.  D/c education completed with patient/family including follow up instructions, medication list, d/c activities limitations if indicated, with other d/c instructions as indicated by MD - patient able to verbalize understanding, all questions fully answered.   Patient instructed to return to ED, call 911, or call MD for any changes in condition.   Patient escorted via [name], and D/C home via private auto.  L'ESPERANCE, [name] 01/15/2016 4:11 PM

## 2016-01-15 NOTE — Progress Notes (Signed)
Subjective: 4 Days Post-Op Procedure(s) (LRB): LEFT FOOT IRRIGATION AND DEBRIDEMENT WOUND VAC AND REMOVAL OF HARDWARE (Left) Patient reports pain as mild.  ID notes reviewed.  Objective: Vital signs in last 24 hours: Temp:  [98 F (36.7 C)-98.1 F (36.7 C)] 98 F (36.7 C) (07/17 0607) Pulse Rate:  [88-98] 97 (07/17 0607) Resp:  [18] 18 (07/16 1545) BP: (119-138)/(72-80) 130/72 mmHg (07/17 0607) SpO2:  [95 %-99 %] 95 % (07/17 0607)  Intake/Output from previous day: 07/16 0701 - 07/17 0700 In: -  Out: 1350 [Urine:1350] Intake/Output this shift: Total I/O In: -  Out: 1350 [Urine:1350]  No results for input(s): HGB in the last 72 hours. No results for input(s): WBC, RBC, HCT, PLT in the last 72 hours. No results for input(s): NA, K, CL, CO2, BUN, CREATININE, GLUCOSE, CALCIUM in the last 72 hours. No results for input(s): LABPT, INR in the last 72 hours.  PE:  wn wd male in nad.  L foot dressed and dry.  Reinholds working properly.  Assessment/Plan: 4 Days Post-Op Procedure(s) (LRB): LEFT FOOT IRRIGATION AND DEBRIDEMENT WOUND VAC AND REMOVAL OF HARDWARE (Left) Discharge home with home health for [facility] changes and IV abx per ID.  Wylene Simmer 01/15/2016, 6:51 AM

## 2016-01-15 NOTE — Progress Notes (Signed)
Inpatient Diabetes Program Recommendations  AACE/ADA: New Consensus Statement on Inpatient Glycemic Control (2015)  Target Ranges:  Prepandial:   less than 140 mg/dL      Peak postprandial:   less than 180 mg/dL (1-2 hours)      Critically ill patients:  140 - 180 mg/dL  Results for MARTESE, HADERLIE (MRN [id number]) as of 01/15/2016 10:33  Ref. Range 01/14/2016 06:17 01/14/2016 11:19 01/14/2016 16:37 01/14/2016 20:42 01/15/2016 06:04  Glucose-Capillary Latest Ref Range: 65-99 mg/dL 95 106 (H) 105 (H) 128 (H) 133 (H)   Review of Glycemic Control  Diabetes history: DM2 Outpatient Diabetes medications: Tresiba 35 units QHS, Novolog 10-17 units TID with meals, Metformin 1000 mg BID Current orders for Inpatient glycemic control: Levemir 35 units QHS, Novolog 0-15 units TID with meals, Metformin 1000 mg BID  Noted consult for diabetes coordinator per Diabetic Foot Ulcer order set. Chart reviewed and agree with current inpatient recommendations at this time. Will continue to follow.  Thanks, Barnie Alderman, RN, MSN, CDE Diabetes Coordinator Inpatient Diabetes Program [phone] (Team Pager from [place] to [place]) [phone] ([facility] office) [phone] [facility] office) [phone] [facility] ([address]) office)

## 2016-01-15 NOTE — Progress Notes (Signed)
Initial Nutrition Assessment  DOCUMENTATION CODES:   Obesity unspecified  INTERVENTION:  Plans for discharge today.   Recommend nutritional supplementation at home post discharge to aid in wound healing.   NUTRITION DIAGNOSIS:   Increased nutrient needs related to wound healing as evidenced by estimated needs.  GOAL:   Patient will meet greater than or equal to 90% of their needs  MONITOR:   PO intake, Weight trends, Labs, I & O's, Skin  REASON FOR ASSESSMENT:   Consult Wound healing  ASSESSMENT:   55 y.o. male with diabetic neuropathy who suffered a left fifth metatarsal fracture sometime last year. He underwent plate and screw fixation in December then developed 2 ulcers and wound infection over the plate.  Procedure (7/13):  1. I and D of left DFU including skin, subcutaneous tissue, muscle and bone 2. Removal of deep implants from the left 5th MT 3. Left 5th MT base exostectomy 4. Application of wound VAC to left foot diabetic ulcers 1 cm x 1 cm x 1 cm and 1.5 cm x 1 cm x 1 cm  Meal completion has been 100%. Pt reports having a good appetite currently and PTA at home with usual consumption of at least 2 meals a day. Weight has been stable. Plans for discharge today. Recommend nutritional supplementation at home to aid in wound healing.   Pt with no observed significant fat or muscle mass loss.   Labs and medications reviewed.   Diet Order:  Diet heart healthy/carb modified Room service appropriate?: Yes; Fluid consistency:: Thin Diet - low sodium heart healthy  Skin:  Wound (see comment) (Incision on L foot and ankle with VAC)  Last BM:  7/15  Height:   Ht Readings from Last 1 Encounters:  01/11/16 5\' 10"  (1.778 m)    Weight:   Wt Readings from Last 1 Encounters:  01/11/16 260 lb (117.935 kg)    Ideal Body Weight:  75.45 kg  BMI:  Body mass index is 37.31 kg/(m^2).  Estimated Nutritional Needs:   Kcal:  2100-2300  Protein:  115-125  grams  Fluid:  2.1 - 2.3 L/day  EDUCATION NEEDS:   Education needs addressed  Corrin Parker, MS, RD, LDN Pager # [phone] After hours/ weekend pager # [phone]

## 2016-01-15 NOTE — Discharge Summary (Signed)
Physician Discharge Summary  Patient ID: [name] MRN: [id number] DOB/AGE: 01/04/1961 55 y.o.  Admit date: 01/11/2016 Discharge date: 01/15/2016  Admission Diagnoses: Diabetic foot ulcer of left foot with infected hardware; metatarsal stress fx; diabetic neuropathy; hardware complicating wound infection;   Discharge Diagnoses:  Principal Problem:   Diabetic infection of left foot ([place]) Active Problems:   Diabetic ulcer of foot associated with type 2 diabetes mellitus, with necrosis of muscle ([place])   Metatarsal stress fracture of left foot   Diabetes mellitus due to underlying condition with diabetic polyneuropathy ([place])   Hardware complicating wound infection ([place])   Diabetes mellitus due to underlying condition, uncontrolled, with diabetic neuropathy [facility])   Surgery, elective same as above  Discharged Condition: stable  Hospital Course: Patient presented to the [facility] on 01/11/2016 for elective I&D and hardware removal for a nonhealing diabetic foot ulcer and infection following 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  The procedure was performed by Dr. Wylene Simmer.  Intra-op cultures were obtained and a wound vac was applied.  The patient tolerated the procedure well without complication.  The patient was then admitted to the hospital and ID was consulted.  Cultures demonstrated enterobacter and enterococcus.  The patient was placed on 2 grams IV rocephine for 6 weeks per ID recommendation.  A PICC line was inserted during his stay.  The patient is to be discharged home on 01/15/2016 with home health nursing for ABX administration and wound vac change.  The patient tolerated his stay well without complication.  Consults: ID  Significant Diagnostic Studies: labs: intra-op cultures and radiology: X-Ray: to demonstrate removal of hardware during operative procedure.  Treatments: IV hydration, antibiotics: Ancef and ceftriaxone, analgesia:  acetaminophen and oxycodone, cardiac meds: losartan, anticoagulation: ASA, insulin: Humalog, procedures: PICC line and surgery: as stated above.  Discharge Exam: Blood pressure 130/72, pulse 97, temperature 98 F (36.7 C), temperature source Oral, resp. rate 18, height 5\' 10"  (1.778 m), weight 117.935 kg (260 lb), SpO2 95 %. General: WDWN patient in NAD. Psych:  Appropriate mood and affect. Neuro:  A&O x 3, Moving all extremities, sensation intact to light touch HEENT:  EOMs intact Chest:  Even non-labored respirations Skin:  Wound vac changed, with no evidence of purulence. C/D/I.  ACE bandage reapplied. Extremities: warm/dry, mild edema, no erythema or echymosis.  No lymphadenopathy. Pulses: Dorsalis pedis and post tibialis 2+ MSK:  ROM: EHL/FHL intact, MMT: patient can perform quad set, (-) Homan's   Disposition: 01-Home or Self Care with home health nursing  Discharge Instructions    Call MD / Call 911    Complete by:  As directed   If you experience chest pain or shortness of breath, CALL 911 and be transported to the hospital emergency room.  If you develope a fever above 101 F, pus (white drainage) or increased drainage or redness at the wound, or calf pain, call your surgeon's office.     Constipation Prevention    Complete by:  As directed   Drink plenty of fluids.  Prune juice may be helpful.  You may use a stool softener, such as Colace (over the counter) 100 mg twice a day.  Use MiraLax (over the counter) for constipation as needed.     Diet - low sodium heart healthy    Complete by:  As directed      Increase activity slowly as tolerated    Complete by:  As directed  Weight bearing as tolerated    Complete by:  As directed   Laterality:  left  Extremity:  Lower  In CAM boot            Medication List    STOP taking these medications        doxycycline 100 MG capsule  Commonly known as:  VIBRAMYCIN     HYDROcodone-acetaminophen 5-325 MG tablet  Commonly  known as:  NORCO/VICODIN     sulfamethoxazole-trimethoprim 800-160 MG tablet  Commonly known as:  BACTRIM DS,SEPTRA DS      TAKE these medications        aspirin EC 81 MG tablet  Take 81 mg by mouth daily.     cefTRIAXone 2 g in dextrose 5 % 50 mL  Inject 2 g into the vein daily. For 40 days.     docusate sodium 100 MG capsule  Commonly known as:  COLACE  Take 1 capsule (100 mg total) by mouth 2 (two) times daily. While taking narcotic pain medicine.     insulin aspart 100 UNIT/ML injection  Commonly known as:  novoLOG  Inject 10-17 Units into the skin 3 (three) times daily before meals.     losartan-hydrochlorothiazide 100-25 MG tablet  Commonly known as:  HYZAAR  Take 1 tablet by mouth daily.     metFORMIN 1000 MG tablet  Commonly known as:  GLUCOPHAGE  Take 1,000 mg by mouth 2 (two) times daily.     multivitamin-iron-minerals-folic acid chewable tablet  Chew 1 tablet by mouth daily.     oxyCODONE 5 MG immediate release tablet  Commonly known as:  ROXICODONE  Take 1-2 tablets (5-10 mg total) by mouth every 4 (four) hours as needed for moderate pain or severe pain.     senna 8.6 MG Tabs tablet  Commonly known as:  SENOKOT  Take 2 tablets (17.2 mg total) by mouth 2 (two) times daily.     simvastatin 20 MG tablet  Commonly known as:  ZOCOR  Take 20 mg by mouth daily.     tamsulosin 0.4 MG Caps capsule  Commonly known as:  FLOMAX  Take 0.4 mg by mouth daily.     TRESIBA FLEXTOUCH 100 UNIT/ML Sopn  Generic drug:  Insulin Degludec  Inject 35 Units into the skin at bedtime.           Follow-up Information    Follow up with HEWITT, Jenny Reichmann, MD. Schedule an appointment as soon as possible for a visit in 2 weeks.   Specialty:  Orthopedic Surgery   Contact information:   [address] [place] [address] [id number]       Signed: Mechele Claude, Hershal Coria, [facility] Office:  [phone]

## 2016-01-24 NOTE — Anesthesia Postprocedure Evaluation (Signed)
Anesthesia Post Note  Patient: [name]  Procedure(s) Performed: Procedure(s) (LRB): LEFT FOOT IRRIGATION AND DEBRIDEMENT WOUND VAC AND REMOVAL OF HARDWARE (Left)  Patient location during evaluation: PACU Anesthesia Type: General Level of consciousness: awake and alert Pain management: pain level controlled Vital Signs Assessment: post-procedure vital signs reviewed and stable Respiratory status: spontaneous breathing, nonlabored ventilation, respiratory function stable and patient connected to nasal cannula oxygen Cardiovascular status: blood pressure returned to baseline and stable Postop Assessment: no signs of nausea or vomiting Anesthetic complications: no    Last Vitals:  Vitals:   01/14/16 2048 01/15/16 0607  BP: 138/80 130/72  Pulse: 98 97  Resp:    Temp: 36.7 C 36.7 C    Last Pain:  Vitals:   01/15/16 1253  TempSrc:   PainSc: 3                  Tiajuana Amass

## 2016-02-13 ENCOUNTER — Telehealth: Payer: Self-pay | Admitting: *Deleted

## 2016-02-13 ENCOUNTER — Encounter: Payer: Self-pay | Admitting: Internal Medicine

## 2016-02-13 ENCOUNTER — Ambulatory Visit (INDEPENDENT_AMBULATORY_CARE_PROVIDER_SITE_OTHER): Payer: Managed Care, Other (non HMO) | Admitting: Internal Medicine

## 2016-02-13 DIAGNOSIS — L089 Local infection of the skin and subcutaneous tissue, unspecified: Secondary | ICD-10-CM | POA: Diagnosis not present

## 2016-02-13 DIAGNOSIS — E11628 Type 2 diabetes mellitus with other skin complications: Secondary | ICD-10-CM

## 2016-02-13 DIAGNOSIS — E1169 Type 2 diabetes mellitus with other specified complication: Secondary | ICD-10-CM

## 2016-02-13 LAB — C-REACTIVE PROTEIN: CRP: <0.5 mg/dL (ref <0.60)

## 2016-02-13 MED ORDER — DEXTROSE 5 % IV SOLN: 2.0000 g | INTRAVENOUS | 0 refills | Status: AC

## 2016-02-13 NOTE — Telephone Encounter (Signed)
The patient was in to see the doctor today and was unable to tell me the name of his home health provider. He was able to give me the name and number of the RN that comes to his home.  Vaughan Basta RN [phone].  Called Vaughan Basta to give her orders per Dr Megan Salon. Advised her to stop antibiotics for the patient 02/21/16 and she can pull the PICC as well. She advised she can not take a verbal order and will have to call the company and have them fax [place] an order. Gave her the fax and phone number to send the information. Advised her as soon as we get the information we will get it signed and faxed back.

## 2016-02-13 NOTE — Progress Notes (Signed)
[facility]  Patient Active Problem List   Diagnosis Date Noted  . Diabetic infection of left foot ([place]) 01/12/2016    Priority: High  . Surgery, elective   . Hardware complicating wound infection ([place])   . Diabetes mellitus due to underlying condition, uncontrolled, with diabetic neuropathy ([place])   . Metatarsal stress fracture of left foot 01/12/2016  . Diabetes mellitus due to underlying condition with diabetic polyneuropathy ([place]) 01/12/2016  . Diabetic ulcer of foot associated with type 2 diabetes mellitus, with necrosis of muscle ([place]) 01/11/2016    Patient's Medications  New Prescriptions   No medications on file  Previous Medications   ASPIRIN EC 81 MG TABLET    Take 81 mg by mouth daily.   DOCUSATE SODIUM (COLACE) 100 MG CAPSULE    Take 1 capsule (100 mg total) by mouth 2 (two) times daily. While taking narcotic pain medicine.   INSULIN ASPART (NOVOLOG) 100 UNIT/ML INJECTION    Inject 10-17 Units into the skin 3 (three) times daily before meals.    INSULIN DEGLUDEC (TRESIBA FLEXTOUCH) 100 UNIT/ML SOPN    Inject 35 Units into the skin at bedtime.   LOSARTAN-HYDROCHLOROTHIAZIDE (HYZAAR) 100-25 MG TABLET    Take 1 tablet by mouth daily.   METFORMIN (GLUCOPHAGE) 1000 MG TABLET    Take 1,000 mg by mouth 2 (two) times daily.   MULTIVITAMIN-IRON-MINERALS-FOLIC ACID (CENTRUM) CHEWABLE TABLET    Chew 1 tablet by mouth daily.   OXYCODONE (ROXICODONE) 5 MG IMMEDIATE RELEASE TABLET    Take 1-2 tablets (5-10 mg total) by mouth every 4 (four) hours as needed for moderate pain or severe pain.   SENNA (SENOKOT) 8.6 MG TABS TABLET    Take 2 tablets (17.2 mg total) by mouth 2 (two) times daily.   SIMVASTATIN (ZOCOR) 20 MG TABLET    Take 20 mg by mouth daily.   TAMSULOSIN (FLOMAX) 0.4 MG CAPS CAPSULE    Take 0.4 mg by mouth daily.   Modified Medications   Modified Medication Previous Medication   CEFTRIAXONE 2 G IN DEXTROSE 5 % 50 ML cefTRIAXone 2 g in  dextrose 5 % 50 mL      Inject 2 g into the vein daily. For 40 days.    Inject 2 g into the vein daily. For 40 days.  Discontinued Medications   No medications on file    Subjective: Mr. Kopplin is in for his hospital follow-up visit. He was hospitalized last month with a left diabetic foot infection. He underwent surgery and had hardware removal from the left fifth metatarsal. Apparently the bone appeared healthy at the time of surgery. Operative cultures grew group B strep and Enterobacter. He was seen by my partner, Dr. Tommy Medal, who elected to treat him with 6 weeks of IV ceftriaxone. He is now completed 33 days of therapy. He has not had any problems tolerating his PICC or ceftriaxone. He is feeling much better.  Review of Systems: Review of Systems  Constitutional: Negative for chills, diaphoresis, fever and weight loss.  Gastrointestinal: Negative for abdominal pain, diarrhea, nausea and vomiting.  Musculoskeletal: Positive for joint pain.    Past Medical History:  Diagnosis Date  . Arthritis   . BPH (benign prostatic hypertrophy)   . Diabetes mellitus   . Diabetic neuropathy ([place])   . Hypertension   . Metatarsal bone fracture    left 5th toe  . Osteomyelitis of toe of right foot (  [facility])   . Post-operative infection    and diabetic ulcer left foot  . Sleep apnea     does not wear CPAP  . Wears glasses     Social History  Substance Use Topics  . Smoking status: Never Smoker  . Smokeless tobacco: Never Used  . Alcohol use Yes     Comment: occasional     Family History  Problem Relation Age of Onset  . Diabetes Mother   . Diabetes Other     Allergies  Allergen Reactions  . No Known Allergies     Objective: Vitals:   02/13/16 1447  BP: (!) 147/91  Pulse: 93  Temp: 97.6 F (36.4 C)  TempSrc: Oral  Weight: 260 lb (117.9 kg)   Body mass index is 37.31 kg/m.  Physical Exam  Constitutional: He is oriented to person, place, and time.  He is in good spirits.    Cardiovascular: Normal rate and regular rhythm.   No murmur heard. Pulmonary/Chest: Effort normal and breath sounds normal.  Abdominal: Soft. There is no tenderness.  Musculoskeletal:  He has sutures in the surgical incision along the lateral aspect of his left foot. There is no odor or drainage. There is no cellulitis or fluctuance  Neurological: He is alert and oriented to person, place, and time.  Skin: No rash noted.  Right arm PICC site looks good.  Psychiatric: Mood and affect normal.    Lab Results Sed Rate (mm/hr)  Date Value  01/07/2016 56 (H)   Erythrocyte Sed Rate (mm/hr)  Date Value  10/30/2012 46 (H)  10/26/2012 19     Problem List Items Addressed This Visit      High   Diabetic infection of left foot ([place])    He is improving on therapy for polymicrobial diabetic foot infection. I will repeat his inflammatory markers today and plan on continuing ceftriaxone for 9 more days through 02/21/2016. He will follow-up with me in one month.      Relevant Medications   cefTRIAXone 2 g in dextrose 5 % 50 mL   Other Relevant Orders   C-reactive protein   Sedimentation rate    Other Visit Diagnoses   None.      Michel Bickers, MD [facility] [phone] pager   [phone] cell 02/13/2016, 3:18 PM

## 2016-02-13 NOTE — Assessment & Plan Note (Signed)
He is improving on therapy for polymicrobial diabetic foot infection. I will repeat his inflammatory markers today and plan on continuing ceftriaxone for 9 more days through 02/21/2016. He will follow-up with me in one month.

## 2016-02-14 LAB — SEDIMENTATION RATE: SED RATE: 17 mm/h (ref 0–20)

## 2016-02-20 ENCOUNTER — Telehealth: Payer: Self-pay | Admitting: *Deleted

## 2016-02-20 NOTE — Telephone Encounter (Signed)
Have them pull PICC after last Ab dose on 02/21/16.

## 2016-02-20 NOTE — Telephone Encounter (Signed)
[facility] asking for pull PICC order after 02/21/16 stop date.  Dr. Megan Salon please advise.

## 2016-02-20 NOTE — Telephone Encounter (Signed)
Spoke to [place] at [facility].  Order to remove PICC after last dose of IV antibiotic was repeated and verified.

## 2016-03-06 ENCOUNTER — Encounter: Payer: Managed Care, Other (non HMO) | Attending: Internal Medicine | Admitting: Internal Medicine

## 2016-03-06 DIAGNOSIS — E11621 Type 2 diabetes mellitus with foot ulcer: Secondary | ICD-10-CM | POA: Insufficient documentation

## 2016-03-06 DIAGNOSIS — E1142 Type 2 diabetes mellitus with diabetic polyneuropathy: Secondary | ICD-10-CM | POA: Insufficient documentation

## 2016-03-06 DIAGNOSIS — Z794 Long term (current) use of insulin: Secondary | ICD-10-CM | POA: Insufficient documentation

## 2016-03-06 DIAGNOSIS — I10 Essential (primary) hypertension: Secondary | ICD-10-CM | POA: Insufficient documentation

## 2016-03-06 DIAGNOSIS — M86672 Other chronic osteomyelitis, left ankle and foot: Secondary | ICD-10-CM | POA: Insufficient documentation

## 2016-03-06 DIAGNOSIS — L97523 Non-pressure chronic ulcer of other part of left foot with necrosis of muscle: Secondary | ICD-10-CM | POA: Diagnosis not present

## 2016-03-06 NOTE — Progress Notes (Addendum)
[name] ([id number]) Visit Report for 03/06/2016 Abuse/Suicide Risk Screen Details Patient Name: [name], [name]. Date of Service: 03/06/2016 8:45 AM Medical Record Patient Account Number: [id number] Number: Treating RN: Baruch Gouty, RN, BSN, Rita 1961/03/14 [phone] y.o. Other Clinician: Date of Birth/Sex: Male) Treating [name] Primary Care Physician/Extender: [name] Physician: Referring Physician: Gemma [name] in Treatment: 0 Abuse/Suicide Risk Screen Items Answer ABUSE/SUICIDE RISK SCREEN: Has anyone close to you tried to hurt or harm you recentlyo No Do you feel uncomfortable with anyone in your familyo No Has anyone forced you do things that you didnot want to doo No Do you have any thoughts of harming yourselfo No Patient displays signs or symptoms of abuse and/or neglect. No Electronic Signature(s) Signed: 03/06/2016 8:41:19 AM By: Regan Lemming BSN, RN Entered By: Regan Lemming on 03/06/2016 08:41:18 [name] ([id number]) -------------------------------------------------------------------------------- Activities of Daily Living Details Patient Name: [name]. Date of Service: 03/06/2016 8:45 AM Medical Record Patient Account Number: [id number] Number: Treating RN: Baruch Gouty, RN, BSN, Rita 03-19-1961 [phone] y.o. Other Clinician: Date of Birth/Sex: Male) Treating [name] Primary Care Physician/Extender: [name] Physician: Referring Physician: Gemma [name] in Treatment: 0 Activities of Daily Living Items Answer Activities of Daily Living (Please select one for each item) Drive Automobile Completely Able Take Medications Completely Able Use Telephone Completely Able Care for Appearance Completely Able Use Toilet Completely Able Bath / Shower Completely Able Dress Self Completely Able Feed Self Completely Able Walk Completely Able Get In / Out Bed Completely Able Housework Completely Able Prepare  Meals Completely [place] for Self Completely Able Electronic Signature(s) Signed: 03/06/2016 8:41:40 AM By: Regan Lemming BSN, RN Entered By: Regan Lemming on 03/06/2016 08:41:40 [name] ([id number]) -------------------------------------------------------------------------------- Education Assessment Details Patient Name: [name]. Date of Service: 03/06/2016 8:45 AM Medical Record Patient Account Number: [id number] Number: Treating RN: Baruch Gouty, RN, BSN, Rita 10/25/1960 [phone] y.o. Other Clinician: Date of Birth/Sex: Male) Treating [name] Primary Care Physician/Extender: [name] Physician: Referring Physician: Gemma [name] in Treatment: 0 Primary Learner Assessed: Patient Learning Preferences/Education Level/Primary Language Learning Preference: Explanation Highest Education Level: High School Preferred Language: English Cognitive Barrier Assessment/Beliefs Language Barrier: No Physical Barrier Assessment Impaired Vision: Yes Glasses Impaired Hearing: No Decreased Hand dexterity: No Knowledge/Comprehension Assessment Knowledge Level: High Comprehension Level: High Ability to understand written High instructions: Ability to understand verbal High instructions: Motivation Assessment Anxiety Level: Calm Cooperation: Cooperative Interest in Health Problems: Asks Questions Perception: Coherent Willingness to Engage in Self- High Management Activities: Readiness to Engage in Self- High Management Activities: Electronic Signature(s) Signed: 03/06/2016 12:54:55 PM By: Regan Lemming BSN, RN Entered By: Regan Lemming on 03/06/2016 08:45:31 [name], [name] ([id number], [name] ([id number]) -------------------------------------------------------------------------------- Fall Risk Assessment Details Patient Name: [name]. Date of Service: 03/06/2016 8:45 AM Medical Record Patient Account Number:  [id number] Number: Treating RN: Baruch Gouty, RN, BSN, Rita October 19, 1960 [phone] y.o. Other Clinician: Date of Birth/Sex: Male) Treating [name] Primary Care Physician/Extender: [name] Physician: Referring Physician: Gemma [name] in Treatment: 0 Fall Risk Assessment Items Have you had 2 or more falls in the last 12 monthso 0 No Have you had any fall that resulted in injury in the last 12 monthso 0 No FALL RISK ASSESSMENT: History of falling - immediate or within 3 months 0 No Secondary diagnosis 0 No Ambulatory aid None/bed rest/wheelchair/nurse 0 Yes Crutches/cane/walker 0 No Furniture 0 No  IV Access/Saline Lock 0 No Gait/Training Normal/bed rest/immobile 0 Yes Weak 0 No Impaired 0 No Mental Status Oriented to own ability 0 Yes Electronic Signature(s) Signed: 03/06/2016 12:54:55 PM By: Regan Lemming BSN, RN Entered By: Regan Lemming on 03/06/2016 08:45:44 [name] ([id number]) -------------------------------------------------------------------------------- Foot Assessment Details Patient Name: [name]. Date of Service: 03/06/2016 8:45 AM Medical Record Patient Account Number: [id number] Number: Treating RN: Baruch Gouty, RN, BSN, Rita 02-27-1961 [phone] y.o. Other Clinician: Date of Birth/Sex: Male) Treating [name] Primary Care Physician/Extender: [name] Physician: Referring Physician: Gemma [name] in Treatment: 0 Foot Assessment Items Site Locations + = Sensation present, - = Sensation absent, C = Callus, U = Ulcer R = Redness, W = Warmth, M = Maceration, PU = Pre-ulcerative lesion F = Fissure, S = Swelling, D = Dryness Assessment Right: Left: Other Deformity: No No Prior Foot Ulcer: No No Prior Amputation: No No Charcot Joint: No No Ambulatory Status: Ambulatory Without Help Gait: Steady Electronic Signature(s) Signed: 03/06/2016 12:54:55 PM By: Regan Lemming BSN, RN Entered By: Regan Lemming on 03/06/2016  08:46:00 [name] ([name] ([id number]) -------------------------------------------------------------------------------- Nutrition Risk Assessment Details Patient Name: [name]. Date of Service: 03/06/2016 8:45 AM Medical Record Patient Account Number: [id number] Number: Treating RN: Baruch Gouty, RN, BSN, Rita 1960-11-22 [phone] y.o. Other Clinician: Date of Birth/Sex: Male) Treating [name] Primary Care Physician/Extender: [name] Physician: Referring Physician: Gemma [name] in Treatment: 0 Height (in): 70 Weight (lbs): 257 Body Mass Index (BMI): 36.9 Nutrition Risk Assessment Items NUTRITION RISK SCREEN: I have an illness or condition that made me change the kind and/or 0 No amount of food I eat I eat fewer than two meals per day 0 No I eat few fruits and vegetables, or milk products 0 No I have three or more drinks of beer, liquor or wine almost every day 0 No I have tooth or mouth problems that make it hard for me to eat 0 No I don't always have enough money to buy the food I need 0 No I eat alone most of the time 0 No I take three or more different prescribed or over-the-counter drugs a 0 No day Without wanting to, I have lost or gained 10 pounds in the last six 0 No months I am not always physically able to shop, cook and/or feed myself 0 No Nutrition Protocols Good Risk Protocol 0 No interventions needed Moderate Risk Protocol Electronic Signature(s) Signed: 03/06/2016 12:54:55 PM By: Regan Lemming BSN, RN Entered By: Regan Lemming on 03/06/2016 08:45:50

## 2016-03-06 NOTE — Progress Notes (Addendum)
FURQAN, GRIECO ([id number]) Visit Report for 03/06/2016 Allergy List Details Patient Name: [name], [name]. Date of Service: 03/06/2016 8:45 AM Medical Record Patient Account Number: [id number] Number: Treating RN: Baruch Gouty, RN, BSN, Rita December 26, 1960 [phone] y.o. Other Clinician: Date of Birth/Sex: Male) Treating ROBSON, MICHAEL Primary Care Physician: Buddy Duty, JEFFREY Physician/Extender: G Referring Physician: Gemma Payor in Treatment: 0 Allergies Active Allergies No Known Allergies Allergy Notes Electronic Signature(s) Signed: 03/06/2016 8:41:07 AM By: Regan Lemming BSN, RN Entered By: Regan Lemming on 03/06/2016 08:41:07 [name] ([id number]) -------------------------------------------------------------------------------- Arrival Information Details Patient Name: [name]. Date of Service: 03/06/2016 8:45 AM Medical Record Patient Account Number: [id number] Number: Treating RN: Baruch Gouty, RN, BSN, Rita 02/20/1961 [phone] y.o. Other Clinician: Date of Birth/Sex: Male) Treating ROBSON, MICHAEL Primary Care Physician: Buddy Duty, JEFFREY Physician/Extender: G Referring Physician: Gemma Payor in Treatment: 0 Visit Information Patient Arrived: Ambulatory Arrival Time: 08:40 Accompanied By: self Transfer Assistance: None Patient Identification Verified: Yes Secondary Verification Process Yes Completed: Patient Requires Transmission-Based No Precautions: Patient Has Alerts: No History Since Last Visit All ordered tests and consults were completed: No Added or deleted any medications: No Any new allergies or adverse reactions: No Had a fall or experienced change in activities of daily living that may affect risk of falls: No Signs or symptoms of abuse/neglect since last visito No Hospitalized since last visit: No Has Dressing in Place as Prescribed: Yes Electronic Signature(s) Signed: 03/06/2016 8:40:58 AM By: Regan Lemming BSN, RN Entered By: Regan Lemming  on 03/06/2016 08:40:58 [name] ([id number]) -------------------------------------------------------------------------------- Clinic Level of Care Assessment Details Patient Name: [name]. Date of Service: 03/06/2016 8:45 AM Medical Record Patient Account Number: [id number] Number: Treating RN: Baruch Gouty, RN, BSN, Rita 07/02/1960 [phone] y.o. Other Clinician: Date of Birth/Sex: Male) Treating ROBSON, MICHAEL Primary Care Physician: Buddy Duty, JEFFREY Physician/Extender: G Referring Physician: Gemma Payor in Treatment: 0 Clinic Level of Care Assessment Items TOOL 1 Quantity Score []  - Use when EandM and Procedure is performed on INITIAL visit 0 ASSESSMENTS - Nursing Assessment / Reassessment X - General Physical Exam (combine w/ comprehensive assessment (listed just 1 20 below) when performed on new pt. evals) X - Comprehensive Assessment (HX, ROS, Risk Assessments, Wounds Hx, etc.) 1 25 ASSESSMENTS - Wound and Skin Assessment / Reassessment []  - Dermatologic / Skin Assessment (not related to wound area) 0 ASSESSMENTS - Ostomy and/or Continence Assessment and Care []  - Incontinence Assessment and Management 0 []  - Ostomy Care Assessment and Management (repouching, etc.) 0 PROCESS - Coordination of Care X - Simple Patient / Family Education for ongoing care 1 15 []  - Complex (extensive) Patient / Family Education for ongoing care 0 X - Staff obtains Programmer, systems, Records, Test Results / Process Orders 1 10 []  - Staff telephones HHA, Nursing Homes / Clarify orders / etc 0 []  - Routine Transfer to another Facility (non-emergent condition) 0 []  - Routine Hospital Admission (non-emergent condition) 0 X - New Admissions / Biomedical engineer / Ordering NPWT, Apligraf, etc. 1 15 []  - Emergency Hospital Admission (emergent condition) 0 PROCESS - Special Needs []  - Pediatric / Minor Patient Management 0 [name], MISH. ([id number]) []  - Isolation Patient Management  0 []  - Hearing / Language / Visual special needs 0 []  - Assessment of Community assistance (transportation, D/C planning, etc.) 0 []  - Additional assistance / Altered mentation 0 []  - Support Surface(s) Assessment (bed, cushion, seat, etc.) 0 INTERVENTIONS - Miscellaneous []  - External ear exam 0 []  -  Patient Transfer (multiple staff / Civil Service fast streamer / Similar devices) 0 []  - Simple Staple / Suture removal (25 or less) 0 []  - Complex Staple / Suture removal (26 or more) 0 []  - Hypo/Hyperglycemic Management (do not check if billed separately) 0 []  - Ankle / Brachial Index (ABI) - do not check if billed separately 0 Has the patient been seen at the hospital within the last three years: Yes Total Score: 85 Level Of Care: New/Established - Level 3 Electronic Signature(s) Signed: 03/06/2016 12:54:55 PM By: Regan Lemming BSN, RN Entered By: Regan Lemming on 03/06/2016 09:30:30 [name] ([id number]) -------------------------------------------------------------------------------- Encounter Discharge Information Details Patient Name: [name]. Date of Service: 03/06/2016 8:45 AM Medical Record Patient Account Number: [id number] Number: Treating RN: Baruch Gouty, RN, BSN, Rita 1960-11-14 [phone] y.o. Other Clinician: Date of Birth/Sex: Male) Treating ROBSON, MICHAEL Primary Care Physician: Buddy Duty, JEFFREY Physician/Extender: G Referring Physician: Gemma Payor in Treatment: 0 Encounter Discharge Information Items Discharge Pain Level: 0 Discharge Condition: Stable Ambulatory Status: Ambulatory Discharge Destination: Home Transportation: Private Auto Accompanied By: self Schedule Follow-up Appointment: No Medication Reconciliation completed and provided to Patient/Care No Wynton Hufstetler: Provided on Clinical Summary of Care: 03/06/2016 Form Type Recipient Paper Patient HP Electronic Signature(s) Signed: 03/06/2016 9:45:56 AM By: Regan Lemming BSN, RN Previous Signature: 03/06/2016  9:30:30 AM Version By: Ruthine Dose Entered By: Regan Lemming on 03/06/2016 09:45:55 [name] ([id number]) -------------------------------------------------------------------------------- Lower Extremity Assessment Details Patient Name: [name]. Date of Service: 03/06/2016 8:45 AM Medical Record Patient Account Number: [id number] Number: Treating RN: Baruch Gouty, RN, BSN, Rita 03-Jan-1961 [phone] y.o. Other Clinician: Date of Birth/Sex: Male) Treating ROBSON, MICHAEL Primary Care Physician: Buddy Duty, JEFFREY Physician/Extender: G Referring Physician: Gemma Payor in Treatment: 0 Vascular Assessment Pulses: Posterior Tibial Dorsalis Pedis Palpable: [Left:Yes] [Right:Yes] Extremity colors, hair growth, and conditions: Extremity Color: [Left:Normal] [Right:Normal] Hair Growth on Extremity: [Left:No] [Right:No] Temperature of Extremity: [Left:Warm] [Right:Warm] Capillary Refill: [Left:< 3 seconds] [Right:< 3 seconds] Toe Nail Assessment Left: Right: Thick: No No Discolored: No No Deformed: No No Improper Length and Hygiene: No No Electronic Signature(s) Signed: 03/06/2016 12:54:55 PM By: Regan Lemming BSN, RN Entered By: Regan Lemming on 03/06/2016 09:08:03 [name] ([id number]) -------------------------------------------------------------------------------- Multi Wound Chart Details Patient Name: [name]. Date of Service: 03/06/2016 8:45 AM Medical Record Patient Account Number: [id number] Number: Treating RN: Baruch Gouty, RN, BSN, Rita 1960-12-02 [phone] y.o. 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] [N/A:N/A] Comorbid History: [5:Hypertension, Type II Diabetes, Neuropathy] [N/A:N/A] Date Acquired: [5:02/13/2016] [N/A:N/A] Weeks of Treatment: [5:0] [N/A:N/A] Wound Status: [5:Open] [N/A:N/A] Measurements L x W x D 1x0.5x0.1 [N/A:N/A] (cm) Area (cm) : [5:0.393] [N/A:N/A] Volume (cm) : [5:0.039] [N/A:N/A] Classification: [5:Grade 1] [N/A:N/A] Exudate Amount: [5:Small] [N/A:N/A] Exudate Type: [5:Serosanguineous] [N/A:N/A] Exudate Color: [5:red, brown] [N/A:N/A] Wound Margin: [5:Thickened] [N/A:N/A] Granulation Amount: [5:Large (67-100%)] [N/A:N/A] Granulation Quality: [5:Pink, Pale] [N/A:N/A] Necrotic Amount: [5:None Present (0%)] [N/A:N/A] Exposed Structures: [5:Fascia: No Fat: No Tendon: No Muscle: No Joint: No Bone: No] [N/A:N/A] Limited to Skin Breakdown Epithelialization: None N/A N/A Periwound Skin Texture: Edema: No N/A N/A Excoriation: No Induration: No Callus: No Crepitus:  No Fluctuance: No Friable: No Rash: No Scarring: No Periwound Skin Moist: Yes N/A N/A Moisture: Maceration: No Dry/Scaly: No Periwound Skin Color: Atrophie Blanche: No N/A N/A Cyanosis: No Ecchymosis: No Erythema: No Hemosiderin Staining: No Mottled: No Pallor: No Rubor: No Temperature: No Abnormality N/A N/A Tenderness on No N/A N/A Palpation: Wound Preparation: Ulcer Cleansing: N/A N/A Rinsed/Irrigated with Saline Topical Anesthetic Applied: Other: Lidocaine 4% Treatment Notes Electronic Signature(s) Signed: 03/06/2016 12:54:55 PM By: Regan Lemming BSN, RN Entered By: Regan Lemming on 03/06/2016 09:17:49 [name] ([id number]) -------------------------------------------------------------------------------- Multi-Disciplinary Care Plan Details Patient Name: [name], HUTZELL. Date of Service: 03/06/2016  8:45 AM Medical Record Patient Account Number: [id number] Number: Treating RN: Baruch Gouty, RN, BSN, Rita 1960-07-03 [phone] y.o. Other Clinician: Date of Birth/Sex: Male) Treating ROBSON, [place] Primary Care Physician: Buddy Duty, JEFFREY Physician/Extender: G Referring Physician: Gemma Payor in Treatment: 0 Active Inactive Orientation to the Wound Care Program Nursing Diagnoses: Knowledge deficit related to the wound healing center program Goals: Patient/caregiver will verbalize understanding of the [facility] Date Initiated: 03/06/2016 Goal Status: Active Interventions: Provide education on orientation to the wound center Notes: Wound/Skin Impairment Nursing Diagnoses: Impaired tissue integrity Knowledge deficit related to ulceration/compromised skin integrity Goals: Patient/caregiver will verbalize understanding of skin care regimen Date Initiated: 03/06/2016 Goal Status: Active Ulcer/skin breakdown will have a volume reduction of 30% by week 4 Date Initiated: 03/06/2016 Goal Status: Active Ulcer/skin breakdown will have a volume reduction of 50% by week 8 Date Initiated: 03/06/2016 Goal Status: Active Ulcer/skin breakdown will have a volume reduction of 80% by week 12 Date Initiated: 03/06/2016 Goal Status: Active Ulcer/skin breakdown will heal within 14 weeks [name], [name] ([id number]) Date Initiated: 03/06/2016 Goal Status: Active Interventions: Assess patient/caregiver ability to obtain necessary supplies Assess patient/caregiver ability to perform ulcer/skin care regimen upon admission and as needed Assess ulceration(s) every visit Provide education on ulcer and skin care Treatment Activities: Skin care regimen initiated : 03/06/2016 Topical wound management initiated : 03/06/2016 Notes: Electronic Signature(s) Signed: 03/06/2016 12:54:55 PM By: Regan Lemming BSN, RN Entered By: Regan Lemming on 03/06/2016 09:17:27 [name]  ([id number]) -------------------------------------------------------------------------------- Pain Assessment Details Patient Name: [name]. Date of Service: 03/06/2016 8:45 AM Medical Record Patient Account Number: [id number] Number: Treating RN: Baruch Gouty, RN, BSN, Rita 08-Apr-1961 [phone] y.o. Other Clinician: Date of Birth/Sex: Male) Treating ROBSON, MICHAEL Primary Care Physician: Buddy Duty, JEFFREY Physician/Extender: G Referring Physician: Gemma Payor in Treatment: 0 Active Problems Location of Pain Severity and Description of Pain Patient Has Paino No Site Locations With Dressing Change: No Pain Management and Medication Current Pain Management: Electronic Signature(s) Signed: 03/06/2016 12:54:55 PM By: Regan Lemming BSN, RN Entered By: Regan Lemming on 03/06/2016 08:49:00 [name] ([id number]) -------------------------------------------------------------------------------- Patient/Caregiver Education Details Patient Name: [name]. Date of Service: 03/06/2016 8:45 AM Medical Record Patient Account Number: [id number] Number: Treating RN: Baruch Gouty, RN, BSN, Rita [birth year] [phone] y.o. Other Clinician: Date of Birth/Gender: Male) Treating ROBSON, MICHAEL Primary Care Physician: Buddy Duty, JEFFREY Physician/Extender: G Referring Physician: Gemma Payor in Treatment: 0 Education Assessment Education Provided To: Patient Education Topics Provided Welcome To [facility]: Methods: Explain/Verbal Responses: State content correctly Wound/Skin Impairment: Methods: Explain/Verbal Responses: State content correctly Electronic Signature(s) Signed: 03/06/2016 12:54:55 PM By: Regan Lemming BSN, RN Entered By: Regan Lemming on 03/06/2016 09:46:08 [name] ([id number]) -------------------------------------------------------------------------------- Wound Assessment Details Patient Name: [name]. Date of Service: 03/06/2016 8:45  AM Medical Record Patient Account Number:  [id number] Number: Treating RN: Baruch Gouty, RN, BSN, Rita 11-29-1960 [phone] y.o. Other Clinician: Date of Birth/Sex: Male) Treating ROBSON, [place] Primary Care Physician: Buddy Duty, JEFFREY Physician/Extender: G Referring Physician: Gemma Payor in Treatment: 0 Wound Status Wound Number: 5 Primary Diabetic Wound/Ulcer of the Lower Etiology: Extremity Wound Location: Left Foot - Lateral Wound Status: Open Wounding Event: Gradually Appeared Comorbid Hypertension, Type II Diabetes, Date Acquired: 02/13/2016 History: Neuropathy Weeks Of Treatment: 0 Clustered Wound: No Photos Photo Uploaded By: Regan Lemming on 03/06/2016 11:54:37 Wound Measurements Length: (cm) 1 Width: (cm) 0.5 Depth: (cm) 0.1 Area: (cm) 0.393 Volume: (cm) 0.039 % Reduction in Area: % Reduction in Volume: Epithelialization: None Tunneling: No Undermining: No Wound Description Classification: Grade 1 Wound Margin: Thickened Exudate Amount: Small [name], SHAKE. ([id number]) Foul Odor After Cleansing: No Exudate Type: Serosanguineous Exudate Color: red, brown Wound Bed Granulation Amount: Large (67-100%) Exposed Structure Granulation Quality: Pink, Pale Fascia Exposed: No Necrotic Amount: None Present (0%) Fat Layer Exposed: No Tendon Exposed: No Muscle Exposed: No Joint Exposed: No Bone Exposed: No Limited to Skin Breakdown Periwound Skin Texture Texture Color No Abnormalities Noted: No No Abnormalities Noted: No Callus: No Atrophie Blanche: No Crepitus: No Cyanosis: No Excoriation: No Ecchymosis: No Fluctuance: No Erythema: No Friable: No Hemosiderin Staining: No Induration: No Mottled: No Localized Edema: No Pallor: No Rash: No Rubor: No Scarring: No Temperature / Pain Moisture Temperature: No Abnormality No Abnormalities Noted: No Dry / Scaly: No Maceration: No Moist: Yes Wound Preparation Ulcer Cleansing: Rinsed/Irrigated  with Saline Topical Anesthetic Applied: Other: Lidocaine 4%, Treatment Notes Wound #5 (Left, Lateral Foot) 1. Cleansed with: Clean wound with Normal Saline 4. Dressing Applied: Aquacel Ag 5. Secondary Dressing Applied Bordered Foam Dressing Electronic Signature(s) Signed: 03/06/2016 12:54:55 PM By: Regan Lemming BSN, RN [address], [name] ([id number]) Entered By: Regan Lemming on 03/06/2016 09:07:13 [name] ([id number]) -------------------------------------------------------------------------------- Vitals Details Patient Name: [name], [name]. Date of Service: 03/06/2016 8:45 AM Medical Record Patient Account Number: [id number] Number: Treating RN: Baruch Gouty, RN, BSN, Rita 1960-11-08 [phone] y.o. Other Clinician: Date of Birth/Sex: Male) Treating ROBSON, [place] Primary Care Physician: Buddy Duty, JEFFREY Physician/Extender: G Referring Physician: Gemma Payor in Treatment: 0 Vital Signs Time Taken: 08:49 Temperature (F): 98.2 Height (in): 70 Pulse (bpm): 98 Source: Stated Respiratory Rate (breaths/min): 18 Weight (lbs): 260 Blood Pressure (mmHg): 146/99 Source: Stated Reference Range: 80 - 120 mg / dl Body Mass Index (BMI): 37.3 Electronic Signature(s) Signed: 03/06/2016 12:54:55 PM By: Regan Lemming BSN, RN Entered By: Regan Lemming on 03/06/2016 08:50:50

## 2016-03-07 NOTE — Progress Notes (Signed)
ADEYEMI, GOAD ([id number]) Visit Report for 03/06/2016 Chief Complaint Document Details Patient Name: [name], [name]. Date of Service: 03/06/2016 8:45 AM Medical Record Patient Account Number: [id number] Number: Treating RN: Baruch Gouty, RN, BSN, Rita 1961/06/17 [phone] y.o. Other Clinician: Date of Birth/Sex: Male) Treating Suzzette Gasparro Primary Care Physician/Extender: Rose Fillers, JEFFREY Physician: Referring Physician: Gemma Payor in Treatment: 0 Information Obtained from: Patient Chief Complaint Chronic left calf traumatic ulcer (healed). Left third toe ulcer (healed). New right calf ulcer. 10/25/15; patient returns today with a wound over his left lateral foot 03/06/16; patient returns today after further surgery on the left foot as well as a 6 week course of IV antibiotics Electronic Signature(s) Signed: 03/07/2016 12:55:33 PM By: [name] MD Entered By: [name] on 03/06/2016 10:05:32 [name] ([id number]) -------------------------------------------------------------------------------- Debridement Details Patient Name: [name]. Date of Service: 03/06/2016 8:45 AM Medical Record Patient Account Number: [id number] Number: Treating RN: Baruch Gouty, RN, BSN, Rita [birth year] [phone] y.o. Other Clinician: Date of Birth/Sex: Male) Treating Mikayela Deats Primary Care Physician/Extender: Rose Fillers, JEFFREY Physician: Referring Physician: Gemma Payor in Treatment: 0 Debridement Performed for Wound #5 Left,Lateral Foot Assessment: Performed By: Physician Ricard Dillon, MD Debridement: Debridement Pre-procedure Yes - 09:14 Verification/Time Out Taken: Start Time: 09:14 Pain Control: Lidocaine 4% Topical Solution Level: Skin/Subcutaneous Tissue Total Area Debrided (L x 1 (cm) x 0.5 (cm) = 0.5 (cm) W): Tissue and other Non-Viable, Exudate, Fibrin/Slough, Subcutaneous material debrided: Instrument: Blade Bleeding:  Minimum Hemostasis Achieved: Pressure End Time: 09:18 Procedural Pain: 0 Post Procedural Pain: 0 Response to Treatment: Procedure was tolerated well Post Debridement Measurements of Total Wound Length: (cm) 1 Width: (cm) 0.5 Depth: (cm) 0.1 Volume: (cm) 0.039 Character of Wound/Ulcer Post Stable Debridement: Severity of Tissue Post Debridement: Limited to breakdown of skin Post Procedure Diagnosis Same as Pre-procedure Electronic Signature(s) Signed: 03/06/2016 12:54:55 PM By: Regan Lemming BSN, RN Carrell, [name] ([id number]) Signed: 03/07/2016 12:55:33 PM By: [name] MD Entered By: [name] on 03/06/2016 10:04:52 [name] ([id number]) -------------------------------------------------------------------------------- HPI Details Patient Name: THOMES, CORPE. Date of Service: 03/06/2016 8:45 AM Medical Record Patient Account Number: [id number] Number: Treating RN: Baruch Gouty, RN, BSN, Rita [birth year] [phone] y.o. Other Clinician: Date of Birth/Sex: Male) Treating Quindell Shere Primary Care Physician/Extender: Rose Fillers, JEFFREY Physician: Referring Physician: Gemma Payor in Treatment: 0 History of Present Illness HPI Description: Pleasant 55 year old with history of diabetes (Hgb A1c 10.8 in 2014) and peripheral neuropathy. No PVD. L ABI 1.1. Status post right great toe partial amputation years ago. He was at work and on 10/22/2014, was injured by a cart, and suffered an ulceration to his left anterior calf. He says that it subsequently became infected, and he was treated with a course of antibiotics. He was found on initial exam to have an ulceration on the dorsum of his left third toe. He was unaware of this and attributes it to pressure from his steel toed boots. More recently he injured his right anterior calf on a cart. Ambulating normally per his baseline. He has been undergoing regular debridements, applying mupirocin cream, and an Ace wrap  for edema control. He returns to clinic for follow-up and is without complaints. No pain. No fever or chills. No drainage. 10/25/15; this is a 55 year old man who has type II diabetes with diabetic polyneuropathy. He tells me that he fractured his left fifth metatarsal in June 2016 when he presented with swelling. He does not recall a specific  injury. His hemoglobin A1c was apparently too high at the time for consideration of surgery and he was put in some form of offloading. Ultimately he went to surgery in December with an allograft from his calcaneus to this site, plate and screws. He had an x-ray of the foot in March that showed concerns about nonunion. He tells me that in March he had to move and basically moved himself. He was on his foot a lot and then noticed some drainage from an open area. He has been following with his orthopedic surgeon Dr. Doran Durand. He has been applying a felt donut, dry dressing and using his heel healing sandal. 11/01/15; this is a patient I saw last week for the first time. He had a small open wound on the plantar aspect of his left foot at roughly the level of the base of his fifth metatarsal. He had a considerable degree of thickened skin around this wound on the plantar aspect which I thought was from chronic pressure on this area. He tells [place] that he had drainage over the course of the week. No systemic symptoms. 11/08/15; culture last week grew Citrobacter korseri. This should've been sensitive to the Augmentin I gave him. He has seen Dr. Doran Durand who did his initial surgery and according the patient the plan is to give this another month and then the hardware might need to come out of this. This seems like a reasonable plan. I will adjust his antibiotics to ciprofloxacin which probably should continue for at least another 2 weeks. I gave him 10 days worth today 11/22/15 the patient has completed antibiotics. He has an appointment with Dr. Doran Durand this Friday. There  is improved dimensions around the wound on the left fifth metatarsal base 11/29/15; the patient has completed antibiotics last week. Apparently his appointment with Dr. Doran Durand it is not until this Friday. Dimensions are roughly the same. 12/06/15; saw Dr. Doran Durand. No x-ray told the end of the month, next appointment June 30. We have been using Aquacel Ag 12/13/15: No major change this week. Using Aquacel AG VANNY, GOETTE. ([id number]) 621/17; arrived this week with maceration around the wound. There was quite a bit of undermining which required surgical debridement. I changed him to [facility] last week, by the patient's admission he was up on this more this week 12/27/15; macerated tissue around the wound is removed with a scalpel and pickups. There is no undermining. Nonviable subcutaneous tissue and skin taken from the superior circumference of the wound is slough from the surface. READMISSION 03/06/16 since I last saw this patient at the end of June, he went for surgery on 01/11/16 by Dr. Doran Durand of orthopedics. He had a left foot irrigation and debridement, removal of hardware and placement of wound VAC. He is also been followed by Dr. Megan Salon of infectious disease and completed a six-week course of IV Rocephin for group B strep and Enterobacter in the bone at the time of surgery. Apparently at the time of surgery the bone looked healthy so I don't think any bone was actually removed. He has been using silver alginate based dressings on the same wound area at the base of the left fifth metatarsal on the left. I note that he is also had arterial studies on 01/08/16, these showed a left ABI of 1.2 to and a right ABI of 1.3. Waveforms were listed as biphasic. He was not felt to have any specific arterial issues. Electronic Signature(s) Signed: 03/07/2016 12:55:33 PM By: [name]  MD Entered By: [name] on 03/06/2016 10:14:08 [name]  ([id number]) -------------------------------------------------------------------------------- Physical Exam Details Patient Name: JEOVANNY, KELDERMAN. Date of Service: 03/06/2016 8:45 AM Medical Record Patient Account Number: [id number] Number: Treating RN: Baruch Gouty, RN, BSN, Rita [birth year] [phone] y.o. Other Clinician: Date of Birth/Sex: Male) Treating Ellin Fitzgibbons Primary Care Physician/Extender: Rose Fillers, JEFFREY Physician: Referring Physician: Gemma Payor in Treatment: 0 Constitutional Sitting or standing Blood Pressure is within target range for patient.. Pulse regular and within target range for patient.Marland Kitchen Respirations regular, non-labored and within target range.. Temperature is normal and within the target range for the patient.. Patient's appearance is neat and clean. Appears in no acute distress. Well nourished and well developed.. Notes Wound exam; the area in question is on the lateral aspect of his left foot at roughly the level of his base of his fifth metatarsal. This is now a small slitlike wound. Some rolled edges around it. Surface slough removed with a scalpel, I am hopeful that this will stimulate some closure. There is no evidence of surrounding infection or ischemia Electronic Signature(s) Signed: 03/07/2016 12:55:33 PM By: [name] MD Entered By: [name] on 03/06/2016 10:10:00 [name] ([id number]) -------------------------------------------------------------------------------- Physician Orders Details Patient Name: [name]. Date of Service: 03/06/2016 8:45 AM Medical Record Patient Account Number: [id number] Number: Treating RN: Baruch Gouty, RN, BSN, Rita 04-04-1961 [phone] y.o. Other Clinician: Date of Birth/Sex: Male) Treating Giomar Gusler Primary Care Physician/Extender: Rose Fillers, JEFFREY Physician: Referring Physician: Gemma Payor in Treatment: 0 Verbal / Phone Orders: Yes Clinician: Afful, RN, BSN,  Rita Read Back and Verified: Yes Diagnosis Coding Wound Cleansing Wound #5 Left,Lateral Foot o Cleanse wound with mild soap and water Wound #5 Left,Lateral Foot o Cleanse wound with mild soap and water Primary Wound Dressing Wound #5 Left,Lateral Foot o Aquacel Ag Wound #5 Left,Lateral Foot o Aquacel Ag Dressing Change Frequency Wound #5 Left,Lateral Foot o Change dressing every other day. Wound #5 Left,Lateral Foot o Change dressing every other day. Off-Loading Wound #5 Left,Lateral Foot o Multipodus Splint Wound #5 Left,Lateral Foot o Multipodus Splint Additional Orders / Instructions Wound #5 Left,Lateral Foot o Activity as tolerated Wound #5 Left,Lateral Foot ARTHELL, AMBUSH ([id number]) o Activity as tolerated Electronic Signature(s) Signed: 03/06/2016 12:54:55 PM By: Regan Lemming BSN, RN Signed: 03/07/2016 12:55:33 PM By: [name] MD Entered By: Regan Lemming on 03/06/2016 09:24:54 [name] ([id number]) -------------------------------------------------------------------------------- Problem List Details Patient Name: RAKWON, ALFANO. Date of Service: 03/06/2016 8:45 AM Medical Record Patient Account Number: [id number] Number: Treating RN: Baruch Gouty, RN, BSN, Rita 11-01-1960 [phone] y.o. Other Clinician: Date of Birth/Sex: Male) Treating Brannon Levene Primary Care Physician/Extender: Rose Fillers, JEFFREY Physician: Referring Physician: Gemma Payor in Treatment: 0 Active Problems ICD-10 Encounter Code Description Active Date Diagnosis E11.621 Type 2 diabetes mellitus with foot ulcer 03/06/2016 Yes L97.523 Non-pressure chronic ulcer of other part of left foot with 03/06/2016 Yes necrosis of muscle M86.672 Other chronic osteomyelitis, left ankle and foot 03/06/2016 Yes Inactive Problems Resolved Problems Electronic Signature(s) Signed: 03/07/2016 12:55:33 PM By: [name] MD Entered By: [name] on 03/06/2016  10:03:51 [name] ([id number]) -------------------------------------------------------------------------------- Progress Note/History and Physical Details Patient Name: [name]. Date of Service: 03/06/2016 8:45 AM Medical Record Patient Account Number: [id number] Number: Treating RN: Baruch Gouty, RN, BSN, Rita 02/17/1961 [phone] y.o. Other Clinician: Date of Birth/Sex: Male) Treating Anastassia Noack Primary Care Physician/Extender: Rose Fillers, JEFFREY Physician: Referring Physician: Gemma Payor in Treatment:  0 Subjective Chief Complaint Information obtained from Patient Chronic left calf traumatic ulcer (healed). Left third toe ulcer (healed). New right calf ulcer. 10/25/15; patient returns today with a wound over his left lateral foot 03/06/16; patient returns today after further surgery on the left foot as well as a 6 week course of IV antibiotics History of Present Illness (HPI) Pleasant 55 year old with history of diabetes (Hgb A1c 10.8 in 2014) and peripheral neuropathy. No PVD. L ABI 1.1. Status post right great toe partial amputation years ago. He was at work and on 10/22/2014, was injured by a cart, and suffered an ulceration to his left anterior calf. He says that it subsequently became infected, and he was treated with a course of antibiotics. He was found on initial exam to have an ulceration on the dorsum of his left third toe. He was unaware of this and attributes it to pressure from his steel toed boots. More recently he injured his right anterior calf on a cart. Ambulating normally per his baseline. He has been undergoing regular debridements, applying mupirocin cream, and an Ace wrap for edema control. He returns to clinic for follow-up and is without complaints. No pain. No fever or chills. No drainage. 10/25/15; this is a 55 year old man who has type II diabetes with diabetic polyneuropathy. He tells me that he fractured his left fifth metatarsal in  June 2016 when he presented with swelling. He does not recall a specific injury. His hemoglobin A1c was apparently too high at the time for consideration of surgery and he was put in some form of offloading. Ultimately he went to surgery in December with an allograft from his calcaneus to this site, plate and screws. He had an x-ray of the foot in March that showed concerns about nonunion. He tells me that in March he had to move and basically moved himself. He was on his foot a lot and then noticed some drainage from an open area. He has been following with his orthopedic surgeon Dr. Doran Durand. He has been applying a felt donut, dry dressing and using his heel healing sandal. 11/01/15; this is a patient I saw last week for the first time. He had a small open wound on the plantar aspect of his left foot at roughly the level of the base of his fifth metatarsal. He had a considerable degree of thickened skin around this wound on the plantar aspect which I thought was from chronic pressure on this area. He tells [place] that he had drainage over the course of the week. No systemic symptoms. 11/08/15; culture last week grew Citrobacter korseri. This should've been sensitive to the Augmentin I gave him. He has seen Dr. Doran Durand who did his initial surgery and according the patient the plan is to give this another month and then the hardware might need to come out of this. This seems like a reasonable plan. I will adjust his antibiotics to ciprofloxacin which probably should continue for at least another 2 weeks. I gave REILLY, COTTINGHAM ([id number]) him 10 days worth today 11/22/15 the patient has completed antibiotics. He has an appointment with Dr. Doran Durand this Friday. There is improved dimensions around the wound on the left fifth metatarsal base 11/29/15; the patient has completed antibiotics last week. Apparently his appointment with Dr. Doran Durand it is not until this Friday. Dimensions are roughly the same. 12/06/15;  saw Dr. Doran Durand. No x-ray told the end of the month, next appointment June 30. We have been using Aquacel Ag 12/13/15:  No major change this week. Using Aquacel AG 621/17; arrived this week with maceration around the wound. There was quite a bit of undermining which required surgical debridement. I changed him to [facility] last week, by the patient's admission he was up on this more this week 12/27/15; macerated tissue around the wound is removed with a scalpel and pickups. There is no undermining. Nonviable subcutaneous tissue and skin taken from the superior circumference of the wound is slough from the surface. READMISSION 03/06/16 since I last saw this patient at the end of June, he went for surgery on 01/11/16 by Dr. Doran Durand of orthopedics. He had a left foot irrigation and debridement, removal of hardware and placement of wound VAC. He is also been followed by Dr. Megan Salon of infectious disease and completed a six-week course of IV Rocephin for group B strep and Enterobacter in the bone at the time of surgery. Apparently at the time of surgery the bone looked healthy so I don't think any bone was actually removed. He has been using silver alginate based dressings on the same wound area at the base of the left fifth metatarsal on the left Wound History Patient presents with 2 open wounds that have been present for approximately 25months. Patient has been treating wounds in the following manner: NPWT. The wounds have been healed in the past but have re- opened. Laboratory tests have not been performed in the last month. Patient reportedly has not tested positive for an antibiotic resistant organism. Patient reportedly has not tested positive for osteomyelitis. Patient reportedly has not had testing performed to evaluate circulation in the legs. Patient History Information obtained from Patient. Allergies No Known Allergies Family History Diabetes - Mother, Father, Heart Disease - Father,  Mother, Hypertension - Mother, Father, Stroke - Father, No family history of Cancer, Hereditary Spherocytosis, Kidney Disease, Lung Disease, Seizures, Thyroid Problems, Tuberculosis. Social History Never smoker, Marital Status - Married, Alcohol Use - Never, Drug Use - No History, Caffeine Use - Daily. Medical History Eyes Denies history of Cataracts, Glaucoma, Optic Neuritis Ear/Nose/Mouth/Throat Denies history of Chronic sinus problems/congestion, Middle ear problems JESAIAH, TOMME ([id number]) Hematologic/Lymphatic Denies history of Anemia, Hemophilia, Human Immunodeficiency Virus, Lymphedema, Sickle Cell Disease Respiratory Denies history of Aspiration, Asthma, Chronic Obstructive Pulmonary Disease (COPD), Pneumothorax, Sleep Apnea, Tuberculosis Cardiovascular Patient has history of Hypertension Denies history of Angina, Arrhythmia, Congestive Heart Failure, Coronary Artery Disease, Deep Vein Thrombosis, Hypotension, Myocardial Infarction, Peripheral Arterial Disease, Peripheral Venous Disease, Phlebitis, Vasculitis Gastrointestinal Denies history of Cirrhosis , Colitis, Crohn s, Hepatitis A, Hepatitis B, Hepatitis C Endocrine Patient has history of Type II Diabetes Denies history of Type I Diabetes Genitourinary Denies history of End Stage Renal Disease Immunological Denies history of Lupus Erythematosus, Raynaud s, Scleroderma Integumentary (Skin) Denies history of History of Burn, History of pressure wounds Musculoskeletal Denies history of Gout, Rheumatoid Arthritis, Osteoarthritis, Osteomyelitis Neurologic Patient has history of Neuropathy Denies history of Dementia, Quadriplegia, Paraplegia, Seizure Disorder Oncologic Denies history of Received Chemotherapy, Received Radiation Psychiatric Denies history of Anorexia/bulimia, Confinement Anxiety Patient is treated with Insulin. Blood sugar is tested. Review of Systems (ROS) Eyes Complains or has symptoms of  Glasses / Contacts. Hematologic/Lymphatic The patient has no complaints or symptoms. Respiratory The patient has no complaints or symptoms. Cardiovascular The patient has no complaints or symptoms. Gastrointestinal The patient has no complaints or symptoms. Endocrine The patient has no complaints or symptoms. Genitourinary The patient has no complaints or symptoms. Immunological The patient has no complaints or  symptoms. Integumentary (Skin) SHANTA, KNEECE. ([id number]) Complains or has symptoms of Wounds, Breakdown, Swelling. Neurologic The patient has no complaints or symptoms. Psychiatric The patient has no complaints or symptoms. Objective Constitutional Sitting or standing Blood Pressure is within target range for patient.. Pulse regular and within target range for patient.Marland Kitchen Respirations regular, non-labored and within target range.. Temperature is normal and within the target range for the patient.. Patient's appearance is neat and clean. Appears in no acute distress. Well nourished and well developed.. Vitals Time Taken: 8:49 AM, Height: 70 in, Source: Stated, Weight: 260 lbs, Source: Stated, BMI: 37.3, Temperature: 98.2 F, Pulse: 98 bpm, Respiratory Rate: 18 breaths/min, Blood Pressure: 146/99 mmHg. General Notes: Wound exam; the area in question is on the lateral aspect of his left foot at roughly the level of his base of his fifth metatarsal. This is now a small slitlike wound. Some rolled edges around it. Surface slough removed with a scalpel, I am hopeful that this will stimulate some closure. There is no evidence of surrounding infection or ischemia Integumentary (Hair, Skin) Wound #5 status is Open. Original cause of wound was Gradually Appeared. The wound is located on the Left,Lateral Foot. The wound measures 1cm length x 0.5cm width x 0.1cm depth; 0.393cm^2 area and 0.039cm^3 volume. The wound is limited to skin breakdown. There is no tunneling or undermining  noted. There is a small amount of serosanguineous drainage noted. The wound margin is thickened. There is large (67-100%) pink, pale granulation within the wound bed. There is no necrotic tissue within the wound bed. The periwound skin appearance exhibited: Moist. The periwound skin appearance did not exhibit: Callus, Crepitus, Excoriation, Fluctuance, Friable, Induration, Localized Edema, Rash, Scarring, Dry/Scaly, Maceration, Atrophie Blanche, Cyanosis, Ecchymosis, Hemosiderin Staining, Mottled, Pallor, Rubor, Erythema. Periwound temperature was noted as No Abnormality. Assessment Active Problems ICD-10 E11.621 - Type 2 diabetes mellitus with foot ulcer L97.523 - Non-pressure chronic ulcer of other part of left foot with necrosis of muscle SIAOSI, KRISHNAMOORTHY. ([id number]) [phone] - Other chronic osteomyelitis, left ankle and foot Procedures Wound #5 Wound #5 is a Diabetic Wound/Ulcer of the Lower Extremity located on the Left,Lateral Foot . There was a Skin/Subcutaneous Tissue Debridement [id number]) debridement with total area of 0.5 sq cm performed by Ricard Dillon, MD. with the following instrument(s): Blade to remove Non-Viable tissue/material including Exudate, Fibrin/Slough, and Subcutaneous after achieving pain control using Lidocaine 4% Topical Solution. A time out was conducted at 09:14, prior to the start of the procedure. A Minimum amount of bleeding was controlled with Pressure. The procedure was tolerated well with a pain level of 0 throughout and a pain level of 0 following the procedure. Post Debridement Measurements: 1cm length x 0.5cm width x 0.1cm depth; 0.039cm^3 volume. Character of Wound/Ulcer Post Debridement is stable. Severity of Tissue Post Debridement is: Limited to breakdown of skin. Post procedure Diagnosis Wound #5: Same as Pre-Procedure Plan Wound Cleansing: Wound #5 Left,Lateral Foot: Cleanse wound with mild soap and water Wound #5 Left,Lateral  Foot: Cleanse wound with mild soap and water Primary Wound Dressing: Wound #5 Left,Lateral Foot: Aquacel Ag Wound #5 Left,Lateral Foot: Aquacel Ag Dressing Change Frequency: Wound #5 Left,Lateral Foot: Change dressing every other day. Wound #5 Left,Lateral Foot: Change dressing every other day. Off-Loading: Wound #5 Left,Lateral Foot: Multipodus Splint Wound #5 Left,Lateral Foot: Multipodus Splint Additional Orders / Instructions: BUTCH, CRADER ([id number]) Wound #5 Left,Lateral Foot: Activity as tolerated Wound #5 Left,Lateral Foot: Activity as tolerated #1 he has been using  Aquacel Ag to these wounds and changing this every second day. He is using a cam walking boot. At this point I'm not really sure whether this wound is all or progressing towards closure. It is stalled we might consider a more aggressive debridement to remove senescent edges around the wound however for now I really was left with uncertainty about the progression here. Therefore didn't really change the dressing which is Aquacel Ag, will see next week if this is actually progressing towards closure. Post debridement the base of this looks healthy. Electronic Signature(s) Signed: 03/07/2016 12:55:33 PM By: [name] MD Entered By: [name] on 03/06/2016 10:12:09 [name] ([id number]) -------------------------------------------------------------------------------- ROS/PFSH Details Patient Name: IWAN, AMAN. Date of Service: 03/06/2016 8:45 AM Medical Record Patient Account Number: [id number] Number: Treating RN: Baruch Gouty, RN, BSN, Rita 1960/10/21 [phone] y.o. Other Clinician: Date of Birth/Sex: Male) Treating Yoltzin Barg Primary Care Physician/Extender: Rose Fillers, [place] Physician: Referring Physician: Gemma Payor in Treatment: 0 Label Progress Note Print Version as History and Physical for this encounter Information Obtained From Patient Wound History Do you  currently have one or more open woundso Yes How many open wounds do you currently haveo 2 Approximately how long have you had your woundso 3months How have you been treating your wound(s) until nowo NPWT Has your wound(s) ever healed and then re-openedo Yes Have you had any lab work done in the past montho No Have you tested positive for an antibiotic resistant organism (MRSA, VRE)o No Have you tested positive for osteomyelitis (bone infection)o No Have you had any tests for circulation on your legso No Eyes Complaints and Symptoms: Positive for: Glasses / Contacts Medical History: Negative for: Cataracts; Glaucoma; Optic Neuritis Integumentary (Skin) Complaints and Symptoms: Positive for: Wounds; Breakdown; Swelling Medical History: Negative for: History of Burn; History of pressure wounds Ear/Nose/Mouth/Throat Medical History: Negative for: Chronic sinus problems/congestion; Middle ear problems Hematologic/Lymphatic Complaints and Symptoms: No Complaints or Symptoms REILLEY, DOLAN. ([id number]) Medical History: Negative for: Anemia; Hemophilia; Human Immunodeficiency Virus; Lymphedema; Sickle Cell Disease Respiratory Complaints and Symptoms: No Complaints or Symptoms Medical History: Negative for: Aspiration; Asthma; Chronic Obstructive Pulmonary Disease (COPD); Pneumothorax; Sleep Apnea; Tuberculosis Cardiovascular Complaints and Symptoms: No Complaints or Symptoms Medical History: Positive for: Hypertension Negative for: Angina; Arrhythmia; Congestive Heart Failure; Coronary Artery Disease; Deep Vein Thrombosis; Hypotension; Myocardial Infarction; Peripheral Arterial Disease; Peripheral Venous Disease; Phlebitis; Vasculitis Gastrointestinal Complaints and Symptoms: No Complaints or Symptoms Medical History: Negative for: Cirrhosis ; Colitis; Crohnos; Hepatitis A; Hepatitis B; Hepatitis C Endocrine Complaints and Symptoms: No Complaints or Symptoms Medical  History: Positive for: Type II Diabetes Negative for: Type I Diabetes Time with diabetes: 20 yrs Treated with: Insulin Blood sugar tested every day: Yes Tested : 3 times weekly Genitourinary Complaints and Symptoms: No Complaints or Symptoms Medical HistoryMONEY, ETLING ([id number]) Negative for: End Stage Renal Disease Immunological Complaints and Symptoms: No Complaints or Symptoms Medical History: Negative for: Lupus Erythematosus; Raynaudos; Scleroderma Musculoskeletal Medical History: Negative for: Gout; Rheumatoid Arthritis; Osteoarthritis; Osteomyelitis Neurologic Complaints and Symptoms: No Complaints or Symptoms Medical History: Positive for: Neuropathy Negative for: Dementia; Quadriplegia; Paraplegia; Seizure Disorder Oncologic Medical History: Negative for: Received Chemotherapy; Received Radiation Psychiatric Complaints and Symptoms: No Complaints or Symptoms Medical History: Negative for: Anorexia/bulimia; Confinement Anxiety Family and Social History Cancer: No; Diabetes: Yes - Mother, Father; Heart Disease: Yes - Father, Mother; Hereditary Spherocytosis: No; Hypertension: Yes - Mother, Father; Kidney Disease: No; Lung Disease: No; Seizures: No; Stroke: Yes -  Father; Thyroid Problems: No; Tuberculosis: No; Never smoker; Marital Status - Married; Alcohol Use: Never; Drug Use: No History; Caffeine Use: Daily; Financial Concerns: No; Food, Clothing or Shelter Needs: No; Support System Lacking: No; Transportation Concerns: No; Advanced Directives: No; Patient does not want information on Advanced Directives Electronic Signature(s) Signed: 03/06/2016 12:54:55 PM By: Regan Lemming BSN, RN Signed: 03/07/2016 12:55:33 PM By: [name] MD Entered By: Regan Lemming on 03/06/2016 08:48:39 RAVIN, EDLER ([name] ([id number]) -------------------------------------------------------------------------------- SuperBill Details Patient Name:  SAVIER, KADLEC. Date of Service: 03/06/2016 Medical Record Patient Account Number: [id number] Number: Treating RN: Baruch Gouty, RN, BSN, Rita July 06, 1960 [phone] y.o. 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 M86.672 Other chronic osteomyelitis, left ankle and foot Facility Procedures CPT4 Code: [id number] Description: [place] VISIT-LEV 3 EST PT Modifier: Quantity: 1 CPT4 Code: [id number] Description: [id number] - DEB SUBQ TISSUE 20 SQ CM/< ICD-10 Description Diagnosis E11.621 Type 2 diabetes mellitus with foot ulcer Modifier: Quantity: 1 Physician Procedures CPT4 Code: [id number] Description: [id number] - WC PHYS SUBQ TISS 20 SQ CM ICD-10 Description Diagnosis E11.621 Type 2 diabetes mellitus with foot ulcer Modifier: Quantity: 1 Electronic Signature(s) Signed: 03/07/2016 12:55:33 PM By: [name] MD Entered By: [name] on 03/06/2016 10:12:36

## 2016-03-12 ENCOUNTER — Encounter: Payer: Self-pay | Admitting: Internal Medicine

## 2016-03-12 ENCOUNTER — Ambulatory Visit (INDEPENDENT_AMBULATORY_CARE_PROVIDER_SITE_OTHER): Payer: Managed Care, Other (non HMO) | Admitting: Internal Medicine

## 2016-03-12 DIAGNOSIS — E1169 Type 2 diabetes mellitus with other specified complication: Secondary | ICD-10-CM

## 2016-03-12 DIAGNOSIS — E11628 Type 2 diabetes mellitus with other skin complications: Secondary | ICD-10-CM

## 2016-03-12 DIAGNOSIS — L089 Local infection of the skin and subcutaneous tissue, unspecified: Secondary | ICD-10-CM

## 2016-03-12 NOTE — Progress Notes (Signed)
[name]  Patient Active Problem List   Diagnosis Date Noted  . Diabetic infection of left foot ([place]) 01/12/2016    Priority: High  . Surgery, elective   . Hardware complicating wound infection ([place])   . Diabetes mellitus due to underlying condition, uncontrolled, with diabetic neuropathy ([place])   . Metatarsal stress fracture of left foot 01/12/2016  . Diabetes mellitus due to underlying condition with diabetic polyneuropathy ([place]) 01/12/2016  . Diabetic ulcer of foot associated with type 2 diabetes mellitus, with necrosis of muscle ([place]) 01/11/2016    Patient's Medications  New Prescriptions   No medications on file  Previous Medications   ASPIRIN EC 81 MG TABLET    Take 81 mg by mouth daily.   DOCUSATE SODIUM (COLACE) 100 MG CAPSULE    Take 1 capsule (100 mg total) by mouth 2 (two) times daily. While taking narcotic pain medicine.   INSULIN ASPART (NOVOLOG) 100 UNIT/ML INJECTION    Inject 10-17 Units into the skin 3 (three) times daily before meals.    INSULIN DEGLUDEC (TRESIBA FLEXTOUCH) 100 UNIT/ML SOPN    Inject 35 Units into the skin at bedtime.   LOSARTAN-HYDROCHLOROTHIAZIDE (HYZAAR) 100-25 MG TABLET    Take 1 tablet by mouth daily.   METFORMIN (GLUCOPHAGE) 1000 MG TABLET    Take 1,000 mg by mouth 2 (two) times daily.   MULTIVITAMIN-IRON-MINERALS-FOLIC ACID (CENTRUM) CHEWABLE TABLET    Chew 1 tablet by mouth daily.   OXYCODONE (ROXICODONE) 5 MG IMMEDIATE RELEASE TABLET    Take 1-2 tablets (5-10 mg total) by mouth every 4 (four) hours as needed for moderate pain or severe pain.   SENNA (SENOKOT) 8.6 MG TABS TABLET    Take 2 tablets (17.2 mg total) by mouth 2 (two) times daily.   SIMVASTATIN (ZOCOR) 20 MG TABLET    Take 20 mg by mouth daily.   TAMSULOSIN (FLOMAX) 0.4 MG CAPS CAPSULE    Take 0.4 mg by mouth daily.   Modified Medications   No medications on file  Discontinued Medications   No medications on file    Subjective: [name] is in for his routine follow-up visit. He completed 6 weeks of IV ceftriaxone for his left diabetic foot infection on 02/21/2016. He has improved but still has an open wound on his left lateral foot. He has had no drainage. He has only minimal pain. He has had no fever, chills or sweats.  Review of Systems: Review of Systems  Constitutional: Negative for chills, diaphoresis and fever.  Respiratory: Negative for cough and shortness of breath.   Cardiovascular: Negative for chest pain.  Gastrointestinal: Negative for abdominal pain, diarrhea, nausea and vomiting.  Musculoskeletal: Negative for joint pain.    Past Medical History:  Diagnosis Date  . Arthritis   . BPH (benign prostatic hypertrophy)   . Diabetes mellitus   . Diabetic neuropathy ([place])   . Hypertension   . Metatarsal bone fracture    left 5th toe  . Osteomyelitis of toe of right foot ([place])   . Post-operative infection    and diabetic ulcer left foot  . Sleep apnea     does not wear CPAP  . Wears glasses     Social History  Substance Use Topics  . Smoking status: Never Smoker  . Smokeless tobacco: Never Used  . Alcohol use Yes     Comment: occasional     Family History  Problem  Relation Age of Onset  . Diabetes Mother   . Diabetes Other     Allergies  Allergen Reactions  . No Known Allergies     Objective: Vitals:   03/12/16 1036  BP: (!) 166/99  Pulse: 94  Temp: 97.8 F (36.6 C)  TempSrc: Oral  SpO2: 98%  Weight: 268 lb (121.6 kg)   Body mass index is 38.45 kg/m.  Physical Exam  Constitutional: He is oriented to person, place, and time.  He is in good spirits.  Cardiovascular: Normal rate and regular rhythm.   No murmur heard. Pulmonary/Chest: Effort normal and breath sounds normal.  Musculoskeletal:  He has a dime-sized superficial wound on his left lateral foot adjacent to the fifth metatarsal base. There is no drainage, odor, fluctuance or surrounding cellulitis.  Neurological:  He is alert and oriented to person, place, and time.  Skin: No rash noted.  Psychiatric: Mood and affect normal.    Lab Results Sed Rate (mm/hr)  Date Value  02/13/2016 17  01/07/2016 56 (H)   Erythrocyte Sed Rate (mm/hr)  Date Value  10/30/2012 46 (H)  10/26/2012 19   CRP (mg/dL)  Date Value  02/13/2016 <0.5     Problem List Items Addressed This Visit      High   Diabetic infection of left foot ([place])    I do not see any evidence of active infection at this time. I will continue observation off of antibiotics and see him back in 6 weeks. He will continue weekly visits to the wound center.       Other Visit Diagnoses   None.      [name] Bickers, MD [facility] [phone] pager   [phone] cell 03/12/2016, 10:51 AM

## 2016-03-12 NOTE — Assessment & Plan Note (Signed)
I do not see any evidence of active infection at this time. I will continue observation off of antibiotics and see him back in 6 weeks. He will continue weekly visits to the wound center.

## 2016-03-13 ENCOUNTER — Encounter: Payer: Managed Care, Other (non HMO) | Admitting: Internal Medicine

## 2016-03-13 DIAGNOSIS — E11621 Type 2 diabetes mellitus with foot ulcer: Secondary | ICD-10-CM | POA: Diagnosis not present

## 2016-03-13 LAB — C-REACTIVE PROTEIN: CRP: 21.5 mg/L — AB (ref <8.0)

## 2016-03-13 LAB — SEDIMENTATION RATE: Sed Rate: 27 mm/hr — ABNORMAL HIGH (ref 0–20)

## 2016-03-14 NOTE — Progress Notes (Signed)
ZACHARIAS, PAVAO ([id number]) Visit Report for 03/13/2016 Chief Complaint Document Details Patient Name: [name], [name]. Date of Service: 03/13/2016 8:00 AM Medical Record Patient Account Number: [id number] Number: Treating RN: Afful, RN, BSN, Rita 05-29-1961 (55 y.o. Other Clinician: Date of Birth/Sex: Male) Treating ROBSON, MICHAEL Primary Care Physician/Extender: Rose Fillers, JEFFREY Physician: Referring Physician: Buddy Duty, JEFFREY Weeks in Treatment: 1 Information Obtained from: Patient Chief Complaint Chronic left calf traumatic ulcer (healed). Left third toe ulcer (healed). New right calf ulcer. 10/25/15; patient returns today with a wound over his left lateral foot 03/06/16; patient returns today after further surgery on the left foot as well as a 6 week course of IV antibiotics Electronic Signature(s) Signed: 03/14/2016 7:42:49 AM By: [name] MD Entered By: [name] on 03/13/2016 09:44:18 [name] ([id number]) -------------------------------------------------------------------------------- HPI Details Patient Name: [name]. Date of Service: 03/13/2016 8:00 AM Medical Record Patient Account Number: [id number] Number: Treating RN: Afful, RN, BSN, Rita [birth year] (55 y.o. Other Clinician: Date of Birth/Sex: Male) Treating ROBSON, MICHAEL Primary Care Physician/Extender: Rose Fillers, JEFFREY Physician: Referring Physician: Buddy Duty, JEFFREY Weeks in Treatment: 1 History of Present Illness HPI Description: Pleasant 55 year old with history of diabetes (Hgb A1c 10.8 in 2014) and peripheral neuropathy. No PVD. L ABI 1.1. Status post right great toe partial amputation years ago. He was at work and on 10/22/2014, was injured by a cart, and suffered an ulceration to his left anterior calf. He says that it subsequently became infected, and he was treated with a course of antibiotics. He was found on initial exam to have an ulceration on the dorsum  of his left third toe. He was unaware of this and attributes it to pressure from his steel toed boots. More recently he injured his right anterior calf on a cart. Ambulating normally per his baseline. He has been undergoing regular debridements, applying mupirocin cream, and an Ace wrap for edema control. He returns to clinic for follow-up and is without complaints. No pain. No fever or chills. No drainage. 10/25/15; this is a 55 year old man who has type II diabetes with diabetic polyneuropathy. He tells me that he fractured his left fifth metatarsal in June 2016 when he presented with swelling. He does not recall a specific injury. His hemoglobin A1c was apparently too high at the time for consideration of surgery and he was put in some form of offloading. Ultimately he went to surgery in December with an allograft from his calcaneus to this site, plate and screws. He had an x-ray of the foot in March that showed concerns about nonunion. He tells me that in March he had to move and basically moved himself. He was on his foot a lot and then noticed some drainage from an open area. He has been following with his orthopedic surgeon Dr. Doran Durand. He has been applying a felt donut, dry dressing and using his heel healing sandal. 11/01/15; this is a patient I saw last week for the first time. He had a small open wound on the plantar aspect of his left foot at roughly the level of the base of his fifth metatarsal. He had a considerable degree of thickened skin around this wound on the plantar aspect which I thought was from chronic pressure on this area. He tells [place] that he had drainage over the course of the week. No systemic symptoms. 11/08/15; culture last week grew Citrobacter korseri. This should've been sensitive to the Augmentin I gave him. He has seen Dr. Doran Durand  who did his initial surgery and according the patient the plan is to give this another month and then the hardware might need to come out  of this. This seems like a reasonable plan. I will adjust his antibiotics to ciprofloxacin which probably should continue for at least another 2 weeks. I gave him 10 days worth today 11/22/15 the patient has completed antibiotics. He has an appointment with Dr. Doran Durand this Friday. There is improved dimensions around the wound on the left fifth metatarsal base 11/29/15; the patient has completed antibiotics last week. Apparently his appointment with Dr. Doran Durand it is not until this Friday. Dimensions are roughly the same. 12/06/15; saw Dr. Doran Durand. No x-ray told the end of the month, next appointment June 30. We have been using Aquacel Ag 12/13/15: No major change this week. Using Aquacel AG GEVIN, ORTON. ([id number]) 621/17; arrived this week with maceration around the wound. There was quite a bit of undermining which required surgical debridement. I changed him to [facility] last week, by the patient's admission he was up on this more this week 12/27/15; macerated tissue around the wound is removed with a scalpel and pickups. There is no undermining. Nonviable subcutaneous tissue and skin taken from the superior circumference of the wound is slough from the surface. READMISSION 03/06/16 since I last saw this patient at the end of June, he went for surgery on 01/11/16 by Dr. Doran Durand of orthopedics. He had a left foot irrigation and debridement, removal of hardware and placement of wound VAC. He is also been followed by Dr. Megan Salon of infectious disease and completed a six-week course of IV Rocephin for group B strep and Enterobacter in the bone at the time of surgery. Apparently at the time of surgery the bone looked healthy so I don't think any bone was actually removed. He has been using silver alginate based dressings on the same wound area at the base of the left fifth metatarsal on the left. I note that he is also had arterial studies on 01/08/16, these showed a left ABI of 1.2 to and a right  ABI of 1.3. Waveforms were listed as biphasic. He was not felt to have any specific arterial issues. 03/13/16; no real change in the condition of the wound at the left lateral foot at roughly the base of his fifth metatarsal. Use silver alginate last week. Electronic Signature(s) Signed: 03/14/2016 7:42:49 AM By: [name] MD Entered By: [name] on 03/13/2016 09:45:19 [name] ([id number]) -------------------------------------------------------------------------------- Physical Exam Details Patient Name: PRAKASH, MARKUM. Date of Service: 03/13/2016 8:00 AM Medical Record Patient Account Number: [id number] Number: Treating RN: Afful, RN, BSN, Rita Mar 21, 1961 (55 y.o. Other Clinician: Date of Birth/Sex: Male) Treating ROBSON, MICHAEL Primary Care Physician/Extender: Rose Fillers, JEFFREY Physician: Referring Physician: Buddy Duty, JEFFREY Weeks in Treatment: 1 Constitutional Patient is hypertensive.. Pulse regular and within target range for patient.Marland Kitchen Respirations regular, non-labored and within target range.. Temperature is normal and within the target range for the patient.. Patient appears very stable. Respiratory Respiratory effort is easy and symmetric bilaterally. Rate is normal at rest and on room air.. Cardiovascular soft DP pulse. Lymphatic none palpable in the popliteal and inquinal area. Psychiatric No evidence of depression, anxiety, or agitation. Calm, cooperative, and communicative. Appropriate interactions and affect.. Notes Wound exam; the area in question is on the lateral aspect of his foot at roughly the level of the base of his fifth metatarsal. His looks much the same as last week. A small  slitlike wound with overhanging skin. No debridement was necessary, what I see of the surface looked viable. I don't think this is making much progress with silver alginate. No evidence of infection no drainage and no surrounding tenderness Electronic  Signature(s) Signed: 03/14/2016 7:42:49 AM By: [name] MD Entered By: [name] on 03/13/2016 09:49:13 [name] ([id number]) -------------------------------------------------------------------------------- Physician Orders Details Patient Name: [name]. Date of Service: 03/13/2016 8:00 AM Medical Record Patient Account Number: [id number] Number: Treating RN: Afful, RN, BSN, Rita 09-25-1960 (55 y.o. Other Clinician: Date of Birth/Sex: Male) Treating ROBSON, MICHAEL Primary Care Physician/Extender: Rose Fillers, JEFFREY Physician: Referring Physician: Mackey [place]ch[place] in Treatment: 1 Verbal / Phone Orders: Yes Clinician: Afful, RN, BSN, Rita Read Back and Verified: Yes Diagnosis Coding Wound Cleansing Wound #5 Left,Lateral Foot o Cleanse wound with mild soap and water Primary Wound Dressing Wound #5 Left,Lateral Foot o Iodosorb Ointment Dressing Change Frequency Wound #5 Left,Lateral Foot o Change dressing every other day. Off-Loading Wound #5 Left,Lateral Foot o Multipodus Splint Additional Orders / Instructions Wound #5 Left,Lateral Foot o Activity as tolerated Electronic Signature(s) Signed: 03/13/2016 5:39:43 PM By: Regan Lemming BSN, RN Signed: 03/14/2016 7:42:49 AM By: [name] MD Entered By: Regan Lemming on 03/13/2016 08:42:56 [name] ([id number]) -------------------------------------------------------------------------------- Problem List Details Patient Name: SOHIL, ELIZARDO. Date of Service: 03/13/2016 8:00 AM Medical Record Patient Account Number: [id number] Number: Treating RN: Afful, RN, BSN, Rita 09/23/1960 (55 y.o. Other Clinician: Date of Birth/Sex: Male) Treating ROBSON, MICHAEL Primary Care Physician/Extender: Rose Fillers, JEFFREY Physician: Referring Physician: Buddy Duty, JEFFREY Weeks in Treatment: 1 Active Problems ICD-10 Encounter Code Description Active Date Diagnosis E11.621 Type  2 diabetes mellitus with foot ulcer 03/06/2016 Yes L97.523 Non-pressure chronic ulcer of other part of left foot with 03/06/2016 Yes necrosis of muscle M86.672 Other chronic osteomyelitis, left ankle and foot 03/06/2016 Yes Inactive Problems Resolved Problems Electronic Signature(s) Signed: 03/14/2016 7:42:49 AM By: [name] MD Entered By: [name] on 03/13/2016 09:43:52 [name] ([id number]) -------------------------------------------------------------------------------- Progress Note Details Patient Name: [name]. Date of Service: 03/13/2016 8:00 AM Medical Record Patient Account Number: [id number] Number: Treating RN: Afful, RN, BSN, Rita [birth year] (55 y.o. Other Clinician: Date of Birth/Sex: Male) Treating ROBSON, MICHAEL Primary Care Physician/Extender: Rose Fillers, JEFFREY Physician: Referring Physician: Buddy Duty, JEFFREY Weeks in Treatment: 1 Subjective Chief Complaint Information obtained from Patient Chronic left calf traumatic ulcer (healed). Left third toe ulcer (healed). New right calf ulcer. 10/25/15; patient returns today with a wound over his left lateral foot 03/06/16; patient returns today after further surgery on the left foot as well as a 6 week course of IV antibiotics History of Present Illness (HPI) Pleasant 55 year old with history of diabetes (Hgb A1c 10.8 in 2014) and peripheral neuropathy. No PVD. L ABI 1.1. Status post right great toe partial amputation years ago. He was at work and on 10/22/2014, was injured by a cart, and suffered an ulceration to his left anterior calf. He says that it subsequently became infected, and he was treated with a course of antibiotics. He was found on initial exam to have an ulceration on the dorsum of his left third toe. He was unaware of this and attributes it to pressure from his steel toed boots. More recently he injured his right anterior calf on a cart. Ambulating normally per his baseline. He  has been undergoing regular debridements, applying mupirocin cream, and an Ace wrap for edema control. He returns to clinic for follow-up  and is without complaints. No pain. No fever or chills. No drainage. 10/25/15; this is a 55 year old man who has type II diabetes with diabetic polyneuropathy. He tells me that he fractured his left fifth metatarsal in June 2016 when he presented with swelling. He does not recall a specific injury. His hemoglobin A1c was apparently too high at the time for consideration of surgery and he was put in some form of offloading. Ultimately he went to surgery in December with an allograft from his calcaneus to this site, plate and screws. He had an x-ray of the foot in March that showed concerns about nonunion. He tells me that in March he had to move and basically moved himself. He was on his foot a lot and then noticed some drainage from an open area. He has been following with his orthopedic surgeon Dr. Doran Durand. He has been applying a felt donut, dry dressing and using his heel healing sandal. 11/01/15; this is a patient I saw last week for the first time. He had a small open wound on the plantar aspect of his left foot at roughly the level of the base of his fifth metatarsal. He had a considerable degree of thickened skin around this wound on the plantar aspect which I thought was from chronic pressure on this area. He tells [place] that he had drainage over the course of the week. No systemic symptoms. 11/08/15; culture last week grew Citrobacter korseri. This should've been sensitive to the Augmentin I gave him. He has seen Dr. Doran Durand who did his initial surgery and according the patient the plan is to give this another month and then the hardware might need to come out of this. This seems like a reasonable plan. I will adjust his antibiotics to ciprofloxacin which probably should continue for at least another 2 weeks. I gave NACARI, POINT ([id number]) him 10 days worth  today 11/22/15 the patient has completed antibiotics. He has an appointment with Dr. Doran Durand this Friday. There is improved dimensions around the wound on the left fifth metatarsal base 11/29/15; the patient has completed antibiotics last week. Apparently his appointment with Dr. Doran Durand it is not until this Friday. Dimensions are roughly the same. 12/06/15; saw Dr. Doran Durand. No x-ray told the end of the month, next appointment June 30. We have been using Aquacel Ag 12/13/15: No major change this week. Using Aquacel AG 621/17; arrived this week with maceration around the wound. There was quite a bit of undermining which required surgical debridement. I changed him to [facility] last week, by the patient's admission he was up on this more this week 12/27/15; macerated tissue around the wound is removed with a scalpel and pickups. There is no undermining. Nonviable subcutaneous tissue and skin taken from the superior circumference of the wound is slough from the surface. READMISSION 03/06/16 since I last saw this patient at the end of June, he went for surgery on 01/11/16 by Dr. Doran Durand of orthopedics. He had a left foot irrigation and debridement, removal of hardware and placement of wound VAC. He is also been followed by Dr. Megan Salon of infectious disease and completed a six-week course of IV Rocephin for group B strep and Enterobacter in the bone at the time of surgery. Apparently at the time of surgery the bone looked healthy so I don't think any bone was actually removed. He has been using silver alginate based dressings on the same wound area at the base of the left fifth metatarsal on the  left. I note that he is also had arterial studies on 01/08/16, these showed a left ABI of 1.2 to and a right ABI of 1.3. Waveforms were listed as biphasic. He was not felt to have any specific arterial issues. 03/13/16; no real change in the condition of the wound at the left lateral foot at roughly the base of his  fifth metatarsal. Use silver alginate last week. Objective Constitutional Patient is hypertensive.. Pulse regular and within target range for patient.Marland Kitchen Respirations regular, non-labored and within target range.. Temperature is normal and within the target range for the patient.. Patient appears very stable. Vitals Time Taken: 8:35 AM, Height: 70 in, Weight: 260 lbs, BMI: 37.3, Temperature: 97.8 F, Pulse: 97.8 bpm, Respiratory Rate: 18 breaths/min, Blood Pressure: 159/79 mmHg. Respiratory Respiratory effort is easy and symmetric bilaterally. Rate is normal at rest and on room air.. Cardiovascular soft DP pulse. Lymphatic MAHD, CLICK. ([id number]) none palpable in the popliteal and inquinal area. Psychiatric No evidence of depression, anxiety, or agitation. Calm, cooperative, and communicative. Appropriate interactions and affect.. General Notes: Wound exam; the area in question is on the lateral aspect of his foot at roughly the level of the base of his fifth metatarsal. His looks much the same as last week. A small slitlike wound with overhanging skin. No debridement was necessary, what I see of the surface looked viable. I don't think this is making much progress with silver alginate. No evidence of infection no drainage and no surrounding tenderness Integumentary (Hair, Skin) Wound #5 status is Open. Original cause of wound was Gradually Appeared. The wound is located on the Left,Lateral Foot. The wound measures 1cm length x 0.5cm width x 0.1cm depth; 0.393cm^2 area and 0.039cm^3 volume. The wound is limited to skin breakdown. There is no tunneling or undermining noted. There is a small amount of serosanguineous drainage noted. The wound margin is thickened. There is large (67-100%) pink, pale granulation within the wound bed. There is no necrotic tissue within the wound bed. The periwound skin appearance exhibited: Callus, Dry/Scaly, Moist. The periwound skin appearance did  not exhibit: Crepitus, Excoriation, Fluctuance, Friable, Induration, Localized Edema, Rash, Scarring, Maceration, Atrophie Blanche, Cyanosis, Ecchymosis, Hemosiderin Staining, Mottled, Pallor, Rubor, Erythema. Periwound temperature was noted as No Abnormality. Assessment Active Problems ICD-10 E11.621 - Type 2 diabetes mellitus with foot ulcer L97.523 - Non-pressure chronic ulcer of other part of left foot with necrosis of muscle M86.672 - Other chronic osteomyelitis, left ankle and foot Plan Wound Cleansing: Wound #5 Left,Lateral Foot: Cleanse wound with mild soap and water Primary Wound Dressing: Wound #5 Left,Lateral Foot: Iodosorb Ointment Dressing Change Frequency: ROMUALD, SHA. ([id number]) Wound #5 Left,Lateral Foot: Change dressing every other day. Off-Loading: Wound #5 Left,Lateral Foot: Multipodus Splint Additional Orders / Instructions: Wound #5 Left,Lateral Foot: Activity as tolerated #1 I think the only options here with be Iodosorb, Sorbact strips #2 another option might be to remove the overhanging skin so we can get a better look at the underlying surface of the wound although that would mean a difficult debridement and a larger wound. The patient at least today was reluctant to try that. #3 I have decided to go ahead with Iodosorb to see if we can get this to close down. We have given some ointment and he will change this every second day. This will require a two-week trial Electronic Signature(s) Signed: 03/14/2016 7:42:49 AM By: [name] MD Entered By: [name] on 03/13/2016 09:51:47 [name] ([id number]) -------------------------------------------------------------------------------- SuperBill Details  Patient Name: RAYME, STETZ. Date of Service: 03/13/2016 Medical Record Patient Account Number: [id number] Number: Treating RN: Baruch Gouty, RN, BSN, Rita 27-Feb-1961 [id number] y.o. Other Clinician: Date of Birth/Sex: Male) Treating  ROBSON, [place] Primary Care Physician: Buddy Duty, JEFFREY Physician/Extender: G Referring Physician: Buddy Duty, JEFFREY Weeks in Treatment: 1 Diagnosis Coding ICD-10 Codes Code Description E11.621 Type 2 diabetes mellitus with foot ulcer L97.523 Non-pressure chronic ulcer of other part of left foot with necrosis of muscle M86.672 Other chronic osteomyelitis, left ankle and foot Facility Procedures CPT4 Code: [id number] Description: [id number] - WOUND CARE VISIT-LEV 2 EST PT Modifier: Quantity: 1 Physician Procedures CPT4 Code: [id number] Description: [id number] - WC PHYS LEVEL 3 - EST PT ICD-10 Description Diagnosis E11.621 Type 2 diabetes mellitus with foot ulcer Modifier: Quantity: 1 Electronic Signature(s) Signed: 03/14/2016 7:42:49 AM By: [name] MD Entered By: [name] on 03/13/2016 09:52:18

## 2016-03-14 NOTE — Progress Notes (Signed)
SHALEV, RIDENBAUGH ([id number]) Visit Report for 03/13/2016 Arrival Information Details Patient Name: [name], [name]. Date of Service: 03/13/2016 8:00 AM Medical Record Patient Account Number: [id number] Number: Treating RN: Afful, RN, BSN, Rita 1960-11-10 (55 y.o. Other Clinician: Date of Birth/Sex: Male) Treating ROBSON, MICHAEL Primary Care Physician: Buddy Duty, JEFFREY Physician/Extender: G Referring Physician: Buddy Duty, JEFFREY Weeks in Treatment: 1 Visit Information History Since Last Visit All ordered tests and consults were No Patient Arrived: Ambulatory completed: Arrival Time: 08:31 Added or deleted any medications: No Accompanied By: self Any new allergies or adverse reactions: No Transfer Assistance: None Had a fall or experienced change in No Patient Identification Verified: Yes activities of daily living that may affect Secondary Verification Process Yes risk of falls: Completed: Signs or symptoms of abuse/neglect No Patient Requires Transmission-Based No since last visito Precautions: Hospitalized since last visit: No Patient Has Alerts: No Has Dressing in Place as Prescribed: Yes Has Footwear/Offloading in Place as Yes Prescribed: Left: Removable Cast Walker/Walking Boot Pain Present Now: No Electronic Signature(s) Signed: 03/13/2016 5:39:43 PM By: Regan Lemming BSN, RN Entered By: Regan Lemming on 03/13/2016 08:32:09 [name] ([id number]) -------------------------------------------------------------------------------- Clinic Level of Care Assessment Details Patient Name: [name]. Date of Service: 03/13/2016 8:00 AM Medical Record Patient Account Number: [id number] Number: Treating RN: Afful, RN, BSN, Rita Aug 07, 1960 (55 y.o. Other Clinician: Date of Birth/Sex: Male) Treating ROBSON, [place] Primary Care Physician: Buddy Duty, JEFFREY Physician/Extender: G Referring Physician: Buddy Duty, JEFFREY Weeks in Treatment: 1 Clinic Level of  Care Assessment Items TOOL 4 Quantity Score []  - Use when only an EandM is performed on FOLLOW-UP visit 0 ASSESSMENTS - Nursing Assessment / Reassessment X - Reassessment of Co-morbidities (includes updates in patient status) 1 10 X - Reassessment of Adherence to Treatment Plan 1 5 ASSESSMENTS - Wound and Skin Assessment / Reassessment X - Simple Wound Assessment / Reassessment - one wound 1 5 []  - Complex Wound Assessment / Reassessment - multiple wounds 0 []  - Dermatologic / Skin Assessment (not related to wound area) 0 ASSESSMENTS - Focused Assessment []  - Circumferential Edema Measurements - multi extremities 0 []  - Nutritional Assessment / Counseling / Intervention 0 X - Lower Extremity Assessment (monofilament, tuning fork, pulses) 1 5 []  - Peripheral Arterial Disease Assessment (using hand held doppler) 0 ASSESSMENTS - Ostomy and/or Continence Assessment and Care []  - Incontinence Assessment and Management 0 []  - Ostomy Care Assessment and Management (repouching, etc.) 0 PROCESS - Coordination of Care X - Simple Patient / Family Education for ongoing care 1 15 []  - Complex (extensive) Patient / Family Education for ongoing care 0 []  - Staff obtains Programmer, systems, Records, Test Results / Process Orders 0 []  - Staff telephones HHA, Nursing Homes / Clarify orders / etc 0 [name], KELLEN. ([id number]) []  - Routine Transfer to another Facility (non-emergent condition) 0 []  - Routine Hospital Admission (non-emergent condition) 0 []  - New Admissions / Biomedical engineer / Ordering NPWT, Apligraf, etc. 0 []  - Emergency Hospital Admission (emergent condition) 0 []  - Simple Discharge Coordination 0 []  - Complex (extensive) Discharge Coordination 0 PROCESS - Special Needs []  - Pediatric / Minor Patient Management 0 []  - Isolation Patient Management 0 []  - Hearing / Language / Visual special needs 0 []  - Assessment of Community assistance (transportation, D/C planning, etc.) 0 []  -  Additional assistance / Altered mentation 0 []  - Support Surface(s) Assessment (bed, cushion, seat, etc.) 0 INTERVENTIONS - Wound Cleansing / Measurement X - Simple Wound Cleansing -  one wound 1 5 []  - Complex Wound Cleansing - multiple wounds 0 X - Wound Imaging (photographs - any number of wounds) 1 5 []  - Wound Tracing (instead of photographs) 0 X - Simple Wound Measurement - one wound 1 5 []  - Complex Wound Measurement - multiple wounds 0 INTERVENTIONS - Wound Dressings X - Small Wound Dressing one or multiple wounds 1 10 []  - Medium Wound Dressing one or multiple wounds 0 []  - Large Wound Dressing one or multiple wounds 0 []  - Application of Medications - topical 0 []  - Application of Medications - injection 0 [name], SILMON. ([id number]) INTERVENTIONS - Miscellaneous []  - External ear exam 0 []  - Specimen Collection (cultures, biopsies, blood, body fluids, etc.) 0 []  - Specimen(s) / Culture(s) sent or taken to Lab for analysis 0 []  - Patient Transfer (multiple staff / Harrel Lemon Lift / Similar devices) 0 []  - Simple Staple / Suture removal (25 or less) 0 []  - Complex Staple / Suture removal (26 or more) 0 []  - Hypo / Hyperglycemic Management (close monitor of Blood Glucose) 0 []  - Ankle / Brachial Index (ABI) - do not check if billed separately 0 X - Vital Signs 1 5 Has the patient been seen at the hospital within the last three years: Yes Total Score: 70 Level Of Care: New/Established - Level 2 Electronic Signature(s) Signed: 03/13/2016 5:39:43 PM By: Regan Lemming BSN, RN Entered By: Regan Lemming on 03/13/2016 08:44:01 [name] ([id number]) -------------------------------------------------------------------------------- Encounter Discharge Information Details Patient Name: [name]. Date of Service: 03/13/2016 8:00 AM Medical Record Patient Account Number: [id number] Number: Treating RN: Afful, RN, BSN, Rita 04-13-1961 (55 y.o. Other Clinician: Date of  Birth/Sex: Male) Treating ROBSON, MICHAEL Primary Care Physician: Buddy Duty, JEFFREY Physician/Extender: G Referring Physician: Buddy Duty, JEFFREY Weeks in Treatment: 1 Encounter Discharge Information Items Discharge Pain Level: 0 Discharge Condition: Stable Ambulatory Status: Ambulatory Discharge Destination: Home Transportation: Private Auto Accompanied By: self Schedule Follow-up Appointment: No Medication Reconciliation completed and provided to Patient/Care No Regnia Mathwig: Provided on Clinical Summary of Care: 03/13/2016 Form Type Recipient Paper Patient HP Electronic Signature(s) Signed: 03/13/2016 8:50:54 AM By: Ruthine Dose Entered By: Ruthine Dose on 03/13/2016 08:50:54 [name] ([id number]) -------------------------------------------------------------------------------- Lower Extremity Assessment Details Patient Name: [name]. Date of Service: 03/13/2016 8:00 AM Medical Record Patient Account Number: [id number] Number: Treating RN: Afful, RN, BSN, Rita 02-15-1961 (55 y.o. Other Clinician: Date of Birth/Sex: Male) Treating ROBSON, MICHAEL Primary Care Physician: Buddy Duty, JEFFREY Physician/Extender: G Referring Physician: Buddy Duty, JEFFREY Weeks in Treatment: 1 Vascular Assessment Pulses: Posterior Tibial Dorsalis Pedis Palpable: [Left:Yes] Extremity colors, hair growth, and conditions: Extremity Color: [Left:Dusky] Hair Growth on Extremity: [Left:Yes] Temperature of Extremity: [Left:Warm] Capillary Refill: [Left:< 3 seconds] Toe Nail Assessment Left: Right: Thick: Yes Discolored: Yes Deformed: No Improper Length and Hygiene: Yes Electronic Signature(s) Signed: 03/13/2016 5:39:43 PM By: Regan Lemming BSN, RN Entered By: Regan Lemming on 03/13/2016 08:32:33 [name] ([id number]) -------------------------------------------------------------------------------- Multi Wound Chart Details Patient Name: [name]. Date of Service: 03/13/2016  8:00 AM Medical Record Patient Account Number: [id number] Number: Treating RN: Afful, RN, BSN, Rita 02-10-1961 (55 y.o. 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] [N/A:N/A] Comorbid History: [5:Hypertension, Type II Diabetes, Neuropathy] [N/A:N/A] Date Acquired: [5:02/13/2016] [N/A:N/A] Weeks of Treatment: [5:1] [N/A:N/A] Wound Status: [5:Open] [N/A:N/A] Measurements L x W x D 1x0.5x0.1 [N/A:N/A] (cm) Area (cm) : [5:0.393] [N/A:N/A] Volume (cm) : [5:0.039] [N/A:N/A] % Reduction in Area: [5:0.00%] [N/A:N/A] % Reduction in Volume: 0.00% [N/A:N/A] Classification: [5:Grade 1] [N/A:N/A] Exudate Amount: [5:Small] [N/A:N/A] Exudate Type: [5:Serosanguineous] [N/A:N/A] Exudate Color: [5:red, brown] [N/A:N/A] Wound Margin: [5:Thickened] [N/A:N/A] Granulation Amount: [5:Large (67-100%)] [N/A:N/A] Granulation Quality: [5:Pink, Pale] [N/A:N/A] Necrotic Amount: [5:None Present (0%)] [N/A:N/A] Exposed Structures: [5:Fascia: No Fat: No Tendon: No Muscle: No] [N/A:N/A] Joint: No Bone: No Limited to Skin Breakdown Epithelialization: None N/A N/A Periwound Skin Texture: Callus: Yes N/A N/A Edema: No Excoriation: No Induration: No Crepitus: No Fluctuance: No Friable: No Rash: No Scarring: No Periwound Skin Moist: Yes N/A N/A Moisture: Dry/Scaly: Yes Maceration: No Periwound Skin Color: Atrophie Blanche: No N/A N/A Cyanosis: No Ecchymosis: No Erythema:  No Hemosiderin Staining: No Mottled: No Pallor: No Rubor: No Temperature: No Abnormality N/A N/A Tenderness on No N/A N/A Palpation: Wound Preparation: Ulcer Cleansing: N/A N/A Rinsed/Irrigated with Saline Topical Anesthetic Applied: Other: Lidocaine 4% Treatment Notes Electronic Signature(s) Signed: 03/13/2016 5:39:43 PM By: Regan Lemming BSN, RN Entered By: Regan Lemming on 03/13/2016 08:42:14 [name] ([id number]) -------------------------------------------------------------------------------- [place] Details Patient Name: [name], PAVIS. Date of Service: 03/13/2016 8:00 AM Medical Record Patient Account Number: [id number] Number: Treating RN: Afful, RN, BSN, Rita 09-11-1960 (55 y.o. Other Clinician: Date of Birth/Sex: Male) Treating ROBSON, [place] Primary Care Physician: Buddy Duty, JEFFREY Physician/Extender: G Referring Physician: Buddy Duty, JEFFREY Weeks in Treatment: 1 Active Inactive Orientation to the Wound Care Program Nursing Diagnoses: Knowledge deficit related to the wound healing center program Goals: Patient/caregiver will verbalize understanding of the [facility] Date Initiated: 03/06/2016 Goal Status: Active Interventions: Provide education on orientation to the wound center Notes: Wound/Skin Impairment Nursing Diagnoses: Impaired tissue integrity Knowledge deficit related to ulceration/compromised skin integrity Goals: Patient/caregiver will verbalize understanding of skin care regimen Date Initiated: 03/06/2016 Goal Status: Active Ulcer/skin breakdown will have a volume reduction of 30% by week 4 Date Initiated: 03/06/2016 Goal Status: Active Ulcer/skin breakdown will have a volume reduction of 50% by week 8 Date Initiated: 03/06/2016 Goal Status: Active Ulcer/skin breakdown will have a volume reduction of 80% by week 12 Date Initiated: 03/06/2016 Goal Status: Active Ulcer/skin breakdown will heal within  14 weeks [name], [name] ([id number]) Date Initiated: 03/06/2016 Goal Status: Active Interventions: Assess patient/caregiver ability to obtain necessary supplies Assess patient/caregiver ability to perform ulcer/skin care regimen upon admission and as needed Assess ulceration(s) every visit Provide education on ulcer and skin care Treatment Activities: Skin care regimen initiated : 03/06/2016 Topical wound management initiated : 03/06/2016 Notes: Electronic Signature(s) Signed: 03/13/2016 5:39:43 PM By: Regan Lemming BSN, RN Entered By: Regan Lemming on 03/13/2016 08:37:24 [name] ([id number]) -------------------------------------------------------------------------------- Pain Assessment Details Patient Name: [name]. Date of Service: 03/13/2016 8:00 AM Medical Record Patient Account Number: [id number] Number: Treating RN: Afful, RN, BSN, Rita 1960/07/11 (55 y.o. Other Clinician: Date of Birth/Sex: Male) Treating ROBSON, MICHAEL Primary Care Physician: Buddy Duty, JEFFREY Physician/Extender: G Referring Physician: Buddy Duty, JEFFREY Weeks in Treatment: 1 Active Problems Location of Pain Severity and Description of Pain Patient Has Paino No Site Locations With Dressing Change: No Pain Management and Medication Current Pain Management: Electronic Signature(s) Signed: 03/13/2016 5:39:43 PM By: Regan Lemming BSN, RN  Entered By: Regan Lemming on 03/13/2016 08:32:15 [name] ([id number]) -------------------------------------------------------------------------------- Patient/Caregiver Education Details Patient Name: [name]. Date of Service: 03/13/2016 8:00 AM Medical Record Patient Account Number: [id number] Number: Treating RN: Afful, RN, BSN, Rita [birth year] (55 y.o. Other Clinician: Date of Birth/Gender: Male) Treating ROBSON, MICHAEL Primary Care Physician: Buddy Duty, JEFFREY Physician/Extender: G Referring Physician: Mackey Birchwood in  Treatment: 1 Education Assessment Education Provided To: Patient Education Topics Provided Basic Hygiene: Methods: Explain/Verbal Responses: State content correctly Welcome To [facility]: Methods: Explain/Verbal Responses: State content correctly Wound/Skin Impairment: Methods: Explain/Verbal Responses: State content correctly Electronic Signature(s) Signed: 03/13/2016 5:39:43 PM By: Regan Lemming BSN, RN Entered By: Regan Lemming on 03/13/2016 08:48:45 [name] ([id number]) -------------------------------------------------------------------------------- Wound Assessment Details Patient Name: [name]. Date of Service: 03/13/2016 8:00 AM Medical Record Patient Account Number: [id number] Number: Treating RN: Afful, RN, BSN, Rita Jan 22, 1961 (55 y.o. Other Clinician: Date of Birth/Sex: Male) Treating ROBSON, [place] Primary Care Physician: Buddy Duty, JEFFREY Physician/Extender: G Referring Physician: Buddy Duty, JEFFREY Weeks in Treatment: 1 Wound Status Wound Number: 5 Primary Diabetic Wound/Ulcer of the Lower Etiology: Extremity Wound Location: Left Foot - Lateral Wound Status: Open Wounding Event: Gradually Appeared Comorbid Hypertension, Type II Diabetes, Date Acquired: 02/13/2016 History: Neuropathy Weeks Of Treatment: 1 Clustered Wound: No Photos Photo Uploaded By: Regan Lemming on 03/13/2016 17:38:50 Wound Measurements Length: (cm) 1 Width: (cm) 0.5 Depth: (cm) 0.1 Area: (cm) 0.393 Volume: (cm) 0.039 % Reduction in Area: 0% % Reduction in Volume: 0% Epithelialization: None Tunneling: No Undermining: No Wound Description Classification: Grade 1 Wound Margin: Thickened Exudate Amount: Small [name], DEHNER. ([id number]) Foul Odor After Cleansing: No Exudate Type: Serosanguineous Exudate Color: red, brown Wound Bed Granulation Amount: Large (67-100%) Exposed Structure Granulation Quality: Pink, Pale Fascia Exposed: No Necrotic  Amount: None Present (0%) Fat Layer Exposed: No Tendon Exposed: No Muscle Exposed: No Joint Exposed: No Bone Exposed: No Limited to Skin Breakdown Periwound Skin Texture Texture Color No Abnormalities Noted: No No Abnormalities Noted: No Callus: Yes Atrophie Blanche: No Crepitus: No Cyanosis: No Excoriation: No Ecchymosis: No Fluctuance: No Erythema: No Friable: No Hemosiderin Staining: No Induration: No Mottled: No Localized Edema: No Pallor: No Rash: No Rubor: No Scarring: No Temperature / Pain Moisture Temperature: No Abnormality No Abnormalities Noted: No Dry / Scaly: Yes Maceration: No Moist: Yes Wound Preparation Ulcer Cleansing: Rinsed/Irrigated with Saline Topical Anesthetic Applied: Other: Lidocaine 4%, Treatment Notes Wound #5 (Left, Lateral Foot) 1. Cleansed with: Clean wound with Normal Saline 4. Dressing Applied: Iodosorb Ointment 5. Secondary Dressing Applied Gauze and Kerlix/Conform 6. Footwear/Offloading device applied Removable Cast Walker/Walking Boot 7. Secured with [name], [name] ([id number]) Tape Electronic Signature(s) Signed: 03/13/2016 5:39:43 PM By: Regan Lemming BSN, RN Entered By: Regan Lemming on 03/13/2016 08:35:22 [name] ([id number]) -------------------------------------------------------------------------------- Vitals Details Patient Name: [name], DECHRISTOPHER. Date of Service: 03/13/2016 8:00 AM Medical Record Patient Account Number: [id number] Number: Treating RN: Afful, RN, BSN, Rita 1960/07/12 (55 y.o. Other Clinician: Date of Birth/Sex: Male) Treating ROBSON, MICHAEL Primary Care Physician: Buddy Duty, JEFFREY Physician/Extender: G Referring Physician: Buddy Duty, JEFFREY Weeks in Treatment: 1 Vital Signs Time Taken: 08:35 Temperature (F): 97.8 Height (in): 70 Pulse (bpm): 97.8 Weight (lbs): 260 Respiratory Rate (breaths/min): 18 Body Mass Index (BMI): 37.3 Blood Pressure (mmHg): 159/79 Reference Range:  80 - 120 mg / dl Electronic Signature(s) Signed: 03/13/2016 5:39:43 PM By: Regan Lemming BSN, RN Entered By: Regan Lemming on 03/13/2016 08:35:51

## 2016-03-20 ENCOUNTER — Encounter: Payer: Managed Care, Other (non HMO) | Admitting: Internal Medicine

## 2016-03-20 DIAGNOSIS — E11621 Type 2 diabetes mellitus with foot ulcer: Secondary | ICD-10-CM | POA: Diagnosis not present

## 2016-03-21 NOTE — Progress Notes (Signed)
HENCE, KOSTEN ([id number]) Visit Report for 03/20/2016 Chief Complaint Document Details Patient Name: [name], [name]. Date of Service: 03/20/2016 8:00 AM Medical Record Patient Account Number: [id number] Number: Treating RN: Cornell Barman [birth year] (55 y.o. Other Clinician: Date of Birth/Sex: Male) Treating Nikky Duba Primary Care Physician/Extender: Rose Fillers, JEFFREY Physician: Referring Physician: Buddy Duty, JEFFREY Weeks in Treatment: 2 Information Obtained from: Patient Chief Complaint Chronic left calf traumatic ulcer (healed). Left third toe ulcer (healed). New right calf ulcer. 10/25/15; patient returns today with a wound over his left lateral foot 03/06/16; patient returns today after further surgery on the left foot as well as a 6 week course of IV antibiotics Electronic Signature(s) Signed: 03/20/2016 4:24:47 PM By: [name] MD Entered By: [name] on 03/20/2016 08:21:42 [name] ([id number]) -------------------------------------------------------------------------------- HPI Details Patient Name: [name]. Date of Service: 03/20/2016 8:00 AM Medical Record Patient Account Number: [id number] Number: Treating RN: Cornell Barman October 09, 1960 (55 y.o. Other Clinician: Date of Birth/Sex: Male) Treating Decklan Mau Primary Care Physician/Extender: Rose Fillers, JEFFREY Physician: Referring Physician: Buddy Duty, JEFFREY Weeks in Treatment: 2 History of Present Illness HPI Description: Pleasant 55 year old with history of diabetes (Hgb A1c 10.8 in 2014) and peripheral neuropathy. No PVD. L ABI 1.1. Status post right great toe partial amputation years ago. He was at work and on 10/22/2014, was injured by a cart, and suffered an ulceration to his left anterior calf. He says that it subsequently became infected, and he was treated with a course of antibiotics. He was found on initial exam to have an ulceration on the dorsum of his left third  toe. He was unaware of this and attributes it to pressure from his steel toed boots. More recently he injured his right anterior calf on a cart. Ambulating normally per his baseline. He has been undergoing regular debridements, applying mupirocin cream, and an Ace wrap for edema control. He returns to clinic for follow-up and is without complaints. No pain. No fever or chills. No drainage. 10/25/15; this is a 55 year old man who has type II diabetes with diabetic polyneuropathy. He tells me that he fractured his left fifth metatarsal in June 2016 when he presented with swelling. He does not recall a specific injury. His hemoglobin A1c was apparently too high at the time for consideration of surgery and he was put in some form of offloading. Ultimately he went to surgery in December with an allograft from his calcaneus to this site, plate and screws. He had an x-ray of the foot in March that showed concerns about nonunion. He tells me that in March he had to move and basically moved himself. He was on his foot a lot and then noticed some drainage from an open area. He has been following with his orthopedic surgeon Dr. Doran Durand. He has been applying a felt donut, dry dressing and using his heel healing sandal. 11/01/15; this is a patient I saw last week for the first time. He had a small open wound on the plantar aspect of his left foot at roughly the level of the base of his fifth metatarsal. He had a considerable degree of thickened skin around this wound on the plantar aspect which I thought was from chronic pressure on this area. He tells [place] that he had drainage over the course of the week. No systemic symptoms. 11/08/15; culture last week grew Citrobacter korseri. This should've been sensitive to the Augmentin I gave him. He has seen Dr. Doran Durand who did his initial  surgery and according the patient the plan is to give this another month and then the hardware might need to come out of this. This seems  like a reasonable plan. I will adjust his antibiotics to ciprofloxacin which probably should continue for at least another 2 weeks. I gave him 10 days worth today 11/22/15 the patient has completed antibiotics. He has an appointment with Dr. Doran Durand this Friday. There is improved dimensions around the wound on the left fifth metatarsal base 11/29/15; the patient has completed antibiotics last week. Apparently his appointment with Dr. Doran Durand it is not until this Friday. Dimensions are roughly the same. 12/06/15; saw Dr. Doran Durand. No x-ray told the end of the month, next appointment June 30. We have been using Aquacel Ag 12/13/15: No major change this week. Using Aquacel AG SWAIN, EMRICH. ([id number]) 621/17; arrived this week with maceration around the wound. There was quite a bit of undermining which required surgical debridement. I changed him to [facility] last week, by the patient's admission he was up on this more this week 12/27/15; macerated tissue around the wound is removed with a scalpel and pickups. There is no undermining. Nonviable subcutaneous tissue and skin taken from the superior circumference of the wound is slough from the surface. READMISSION 03/06/16 since I last saw this patient at the end of June, he went for surgery on 01/11/16 by Dr. Doran Durand of orthopedics. He had a left foot irrigation and debridement, removal of hardware and placement of wound VAC. He is also been followed by Dr. Megan Salon of infectious disease and completed a six-week course of IV Rocephin for group B strep and Enterobacter in the bone at the time of surgery. Apparently at the time of surgery the bone looked healthy so I don't think any bone was actually removed. He has been using silver alginate based dressings on the same wound area at the base of the left fifth metatarsal on the left. I note that he is also had arterial studies on 01/08/16, these showed a left ABI of 1.2 to and a right ABI of  1.3. Waveforms were listed as biphasic. He was not felt to have any specific arterial issues. 03/13/16; no real change in the condition of the wound at the left lateral foot at roughly the base of his fifth metatarsal. Use silver alginate last week. 03/18/16 arrives today with no open area. Being suspicious of the overlying callus I. Some of this back although I see nothing but covering tissue here/epithelium. There is no surrounding tenderness Electronic Signature(s) Signed: 03/20/2016 4:24:47 PM By: [name] MD Entered By: [name] on 03/20/2016 08:22:37 [name] ([id number]) -------------------------------------------------------------------------------- Callus Pairing Details Patient Name: TONE, JEPPERSON. Date of Service: 03/20/2016 8:00 AM Medical Record Patient Account Number: [id number] Number: Treating RN: Cornell Barman Apr 10, 1961 (54 y.o. Other Clinician: Date of Birth/Sex: Male) Treating Ranell Finelli Primary Care Physician/Extender: Rose Fillers, JEFFREY Physician: Referring Physician: Buddy Duty, JEFFREY Weeks in Treatment: 2 Procedure Performed for: Wound #5 Left,Lateral Foot Performed By: Physician Ricard Dillon, MD Post Procedure Diagnosis Same as Pre-procedure Electronic Signature(s) Signed: 03/20/2016 4:24:47 PM By: [name] MD Entered By: [name] on 03/20/2016 08:21:19 [name] ([id number]) -------------------------------------------------------------------------------- Physical Exam Details Patient Name: LAZER, MELOCHE. Date of Service: 03/20/2016 8:00 AM Medical Record Patient Account Number: [id number] Number: Treating RN: Cornell Barman 05-Sep-1960 (54 y.o. Other Clinician: Date of Birth/Sex: Male) Treating Sedalia Greeson Primary Care Physician/Extender: Rose Fillers, JEFFREY Physician: Referring Physician: Buddy Duty, JEFFREY  Weeks in Treatment: 2 Constitutional Patient is hypertensive.. Pulse regular and within  target range for patient.Marland Kitchen Respirations regular, non-labored and within target range.. Temperature is normal and within the target range for the patient.. Patient's appearance is neat and clean. Appears in no acute distress. Well nourished and well developed.. Notes Wound exam; the area in question is actually fully epithelialized I think under some callus material. I did remove some of this with a curette I see nothing but epithelium underneath this. I think he can be discharged from the clinic at this point. Electronic Signature(s) Signed: 03/20/2016 4:24:47 PM By: [name] MD Entered By: [name] on 03/20/2016 [id number] [name] ([id number]) -------------------------------------------------------------------------------- Physician Orders Details Patient Name: ISSA, SUNIGA. Date of Service: 03/20/2016 8:00 AM Medical Record Patient Account Number: [id number] Number: Treating RN: Cornell Barman July 30, 1960 (54 y.o. Other Clinician: Date of Birth/Sex: Male) Treating Angelina Venard Primary Care Physician/Extender: Rose Fillers, JEFFREY Physician: Referring Physician: Mackey Birchwood in Treatment: 2 Verbal / Phone Orders: Yes Clinician: Cornell Barman Read Back and Verified: Yes Diagnosis Coding Primary Wound Dressing o Other: - felt to protect area Discharge From [place] [address] [place] o Discharge from [place] - treatment complete Electronic Signature(s) Signed: 03/20/2016 4:24:47 PM By: [name] MD Signed: 03/20/2016 5:11:18 PM By: Gretta Cool RN, BSN, Kim RN, BSN Entered By: Gretta Cool, RN, BSN, Kim on 03/20/2016 08:14:25 [name] ([id number]) -------------------------------------------------------------------------------- Problem List Details Patient Name: JAWUN, DUDZIK. Date of Service: 03/20/2016 8:00 AM Medical Record Patient Account Number: [id number] Number: Treating RN: Cornell Barman 04/30/1961 (54  y.o. Other Clinician: Date of Birth/Sex: Male) Treating Seniya Stoffers Primary Care Physician/Extender: Rose Fillers, JEFFREY Physician: Referring Physician: Buddy Duty, JEFFREY Weeks in Treatment: 2 Active Problems ICD-10 Encounter Code Description Active Date Diagnosis E11.621 Type 2 diabetes mellitus with foot ulcer 03/06/2016 Yes L97.523 Non-pressure chronic ulcer of other part of left foot with 03/06/2016 Yes necrosis of muscle M86.672 Other chronic osteomyelitis, left ankle and foot 03/06/2016 Yes Inactive Problems Resolved Problems Electronic Signature(s) Signed: 03/20/2016 4:24:47 PM By: [name] MD Entered By: [name] on 03/20/2016 08:21:04 [name] ([id number]) -------------------------------------------------------------------------------- Progress Note Details Patient Name: [name]. Date of Service: 03/20/2016 8:00 AM Medical Record Patient Account Number: [id number] Number: Treating RN: Cornell Barman Jan 30, 1961 (54 y.o. Other Clinician: Date of Birth/Sex: Male) Treating Jalayia Bagheri Primary Care Physician/Extender: Rose Fillers, JEFFREY Physician: Referring Physician: Buddy Duty, JEFFREY Weeks in Treatment: 2 Subjective Chief Complaint Information obtained from Patient Chronic left calf traumatic ulcer (healed). Left third toe ulcer (healed). New right calf ulcer. 10/25/15; patient returns today with a wound over his left lateral foot 03/06/16; patient returns today after further surgery on the left foot as well as a 6 week course of IV antibiotics History of Present Illness (HPI) Pleasant 55 year old with history of diabetes (Hgb A1c 10.8 in 2014) and peripheral neuropathy. No PVD. L ABI 1.1. Status post right great toe partial amputation years ago. He was at work and on 10/22/2014, was injured by a cart, and suffered an ulceration to his left anterior calf. He says that it subsequently became infected, and he was treated with a course of  antibiotics. He was found on initial exam to have an ulceration on the dorsum of his left third toe. He was unaware of this and attributes it to pressure from his steel toed boots. More recently he injured his right anterior calf on a cart. Ambulating normally per his  baseline. He has been undergoing regular debridements, applying mupirocin cream, and an Ace wrap for edema control. He returns to clinic for follow-up and is without complaints. No pain. No fever or chills. No drainage. 10/25/15; this is a 55 year old man who has type II diabetes with diabetic polyneuropathy. He tells me that he fractured his left fifth metatarsal in June 2016 when he presented with swelling. He does not recall a specific injury. His hemoglobin A1c was apparently too high at the time for consideration of surgery and he was put in some form of offloading. Ultimately he went to surgery in December with an allograft from his calcaneus to this site, plate and screws. He had an x-ray of the foot in March that showed concerns about nonunion. He tells me that in March he had to move and basically moved himself. He was on his foot a lot and then noticed some drainage from an open area. He has been following with his orthopedic surgeon Dr. Doran Durand. He has been applying a felt donut, dry dressing and using his heel healing sandal. 11/01/15; this is a patient I saw last week for the first time. He had a small open wound on the plantar aspect of his left foot at roughly the level of the base of his fifth metatarsal. He had a considerable degree of thickened skin around this wound on the plantar aspect which I thought was from chronic pressure on this area. He tells [place] that he had drainage over the course of the week. No systemic symptoms. 11/08/15; culture last week grew Citrobacter korseri. This should've been sensitive to the Augmentin I gave him. He has seen Dr. Doran Durand who did his initial surgery and according the patient the  plan is to give this another month and then the hardware might need to come out of this. This seems like a reasonable plan. I will adjust his antibiotics to ciprofloxacin which probably should continue for at least another 2 weeks. I gave LISLE, SAFRANSKI ([id number]) him 10 days worth today 11/22/15 the patient has completed antibiotics. He has an appointment with Dr. Doran Durand this Friday. There is improved dimensions around the wound on the left fifth metatarsal base 11/29/15; the patient has completed antibiotics last week. Apparently his appointment with Dr. Doran Durand it is not until this Friday. Dimensions are roughly the same. 12/06/15; saw Dr. Doran Durand. No x-ray told the end of the month, next appointment June 30. We have been using Aquacel Ag 12/13/15: No major change this week. Using Aquacel AG 621/17; arrived this week with maceration around the wound. There was quite a bit of undermining which required surgical debridement. I changed him to [facility] last week, by the patient's admission he was up on this more this week 12/27/15; macerated tissue around the wound is removed with a scalpel and pickups. There is no undermining. Nonviable subcutaneous tissue and skin taken from the superior circumference of the wound is slough from the surface. READMISSION 03/06/16 since I last saw this patient at the end of June, he went for surgery on 01/11/16 by Dr. Doran Durand of orthopedics. He had a left foot irrigation and debridement, removal of hardware and placement of wound VAC. He is also been followed by Dr. Megan Salon of infectious disease and completed a six-week course of IV Rocephin for group B strep and Enterobacter in the bone at the time of surgery. Apparently at the time of surgery the bone looked healthy so I don't think any bone was actually removed.  He has been using silver alginate based dressings on the same wound area at the base of the left fifth metatarsal on the left. I note that he is  also had arterial studies on 01/08/16, these showed a left ABI of 1.2 to and a right ABI of 1.3. Waveforms were listed as biphasic. He was not felt to have any specific arterial issues. 03/13/16; no real change in the condition of the wound at the left lateral foot at roughly the base of his fifth metatarsal. Use silver alginate last week. 03/18/16 arrives today with no open area. Being suspicious of the overlying callus I. Some of this back although I see nothing but covering tissue here/epithelium. There is no surrounding tenderness Objective Constitutional Patient is hypertensive.. Pulse regular and within target range for patient.Marland Kitchen Respirations regular, non-labored and within target range.. Temperature is normal and within the target range for the patient.. Patient's appearance is neat and clean. Appears in no acute distress. Well nourished and well developed.. Vitals Time Taken: 8:01 AM, Height: 70 in, Weight: 260 lbs, BMI: 37.3, Temperature: 98.1 F, Pulse: 98 bpm, Respiratory Rate: 16 breaths/min, Blood Pressure: 162/84 mmHg. General Notes: Wound exam; the area in question is actually fully epithelialized I think under some callus material. I did remove some of this with a curette I see nothing but epithelium underneath this. I think he can be discharged from the clinic at this point. JR, KOVACICH ([id number]) Integumentary (Hair, Skin) Wound #5 status is Healed - Epithelialized. Original cause of wound was Gradually Appeared. The wound is located on the Left,Lateral Foot. The wound measures 0cm length x 0cm width x 0cm depth; 0cm^2 area and 0cm^3 volume. The wound is limited to skin breakdown. There is a small amount of serosanguineous drainage noted. The wound margin is thickened. There is large (67-100%) pink, pale granulation within the wound bed. There is no necrotic tissue within the wound bed. The periwound skin appearance exhibited: Callus, Dry/Scaly, Moist. The periwound skin  appearance did not exhibit: Crepitus, Excoriation, Fluctuance, Friable, Induration, Localized Edema, Rash, Scarring, Maceration, Atrophie Blanche, Cyanosis, Ecchymosis, Hemosiderin Staining, Mottled, Pallor, Rubor, Erythema. Periwound temperature was noted as No Abnormality. Assessment Active Problems ICD-10 E11.621 - Type 2 diabetes mellitus with foot ulcer L97.523 - Non-pressure chronic ulcer of other part of left foot with necrosis of muscle M86.672 - Other chronic osteomyelitis, left ankle and foot Procedures Wound #5 Wound #5 is a Diabetic Wound/Ulcer of the Lower Extremity located on the Left,Lateral Foot . An Callus Pairing procedure was performed by Ricard Dillon, MD. Post procedure Diagnosis Wound #5: Same as Pre-Procedure Plan Primary Wound Dressing: Other: - felt to protect area Discharge From [facility]: Wound #5 Left,Lateral Foot: Discharge from [place] complete ARSHAN, VILLAREAL ([id number]) I think he can be discharged from this clinic. I could not see any open area adivised to offload this areea need diabetic work boots DR Buddy Duty is consulting endocinologist follows with Dr. Doran Durand tomorrow Electronic Signature(s) Signed: 03/20/2016 4:24:47 PM By: [name] MD Entered By: [name] on 03/20/2016 08:29:40 [name] ([id number]) -------------------------------------------------------------------------------- [place] Details Patient Name: [name]. Date of Service: 03/20/2016 Medical Record Patient Account Number: [id number] Number: Treating RN: Cornell Barman Dec 12, 1960 (54 y.o. Other Clinician: Date of Birth/Sex: Male) Treating Lanee Chain Primary Care Physician: Buddy Duty, JEFFREY Physician/Extender: G Referring Physician: Buddy Duty, JEFFREY Weeks in Treatment: 2 Diagnosis Coding ICD-10 Codes Code Description E11.621 Type 2 diabetes mellitus with foot ulcer L97.523  Non-pressure chronic ulcer of other part  of left foot with necrosis of muscle M86.672 Other chronic osteomyelitis, left ankle and foot Facility Procedures CPT4 Code: [id number] Description: [phone] - WOUND CARE VISIT-LEV 2 EST PT Modifier: Quantity: 1 CPT4 Code: [id number] Description: [id number] - PARE BENIGN LES; SGL ICD-10 Description Diagnosis E11.621 Type 2 diabetes mellitus with foot ulcer Modifier: Quantity: 1 Physician Procedures CPT4 Code: [id number] Description: [id number] - WC PHYS PARE BENIGN LES; SGL ICD-10 Description Diagnosis E11.621 Type 2 diabetes mellitus with foot ulcer Modifier: Quantity: 1 Electronic Signature(s) Signed: 03/20/2016 4:24:47 PM By: [name] MD Entered By: [name] on 03/20/2016 08:30:00

## 2016-03-21 NOTE — Progress Notes (Signed)
HELMER, SLOVER ([id number]) Visit Report for 03/20/2016 Arrival Information Details Patient Name: [name], [name]. Date of Service: 03/20/2016 8:00 AM Medical Record Patient Account Number: [id number] Number: Treating RN: Cornell Barman 1961/06/24 (54 y.o. Other Clinician: Date of Birth/Sex: Male) Treating ROBSON, MICHAEL Primary Care Physician: Buddy Duty, JEFFREY Physician/Extender: G Referring Physician: Buddy Duty, JEFFREY Weeks in Treatment: 2 Visit Information History Since Last Visit Added or deleted any medications: No Patient Arrived: Ambulatory Any new allergies or adverse reactions: No Arrival Time: 08:01 Had a fall or experienced change in No Accompanied By: self activities of daily living that may affect Transfer Assistance: None risk of falls: Patient Identification Verified: Yes Signs or symptoms of abuse/neglect since last No Secondary Verification Process Yes visito Completed: Hospitalized since last visit: No Patient Requires Transmission-Based No Has Dressing in Place as Prescribed: Yes Precautions: Pain Present Now: No Patient Has Alerts: No Electronic Signature(s) Signed: 03/20/2016 5:11:18 PM By: Gretta Cool, RN, BSN, Kim RN, BSN Entered By: Gretta Cool, RN, BSN, Kim on 03/20/2016 08:01:30 [name] ([id number]) -------------------------------------------------------------------------------- Clinic Level of Care Assessment Details Patient Name: [name], [name]. Date of Service: 03/20/2016 8:00 AM Medical Record Patient Account Number: [id number] Number: Treating RN: Cornell Barman [birth year] (54 y.o. Other Clinician: Date of Birth/Sex: Male) Treating ROBSON, [place] Primary Care Physician: Buddy Duty, JEFFREY Physician/Extender: G Referring Physician: Buddy Duty, JEFFREY Weeks in Treatment: 2 Clinic Level of Care Assessment Items TOOL 4 Quantity Score []  - Use when only an EandM is performed on FOLLOW-UP visit 0 ASSESSMENTS - Nursing Assessment /  Reassessment []  - Reassessment of Co-morbidities (includes updates in patient status) 0 X - Reassessment of Adherence to Treatment Plan 1 5 ASSESSMENTS - Wound and Skin Assessment / Reassessment X - Simple Wound Assessment / Reassessment - one wound 1 5 []  - Complex Wound Assessment / Reassessment - multiple wounds 0 []  - Dermatologic / Skin Assessment (not related to wound area) 0 ASSESSMENTS - Focused Assessment []  - Circumferential Edema Measurements - multi extremities 0 []  - Nutritional Assessment / Counseling / Intervention 0 []  - Lower Extremity Assessment (monofilament, tuning fork, pulses) 0 []  - Peripheral Arterial Disease Assessment (using hand held doppler) 0 ASSESSMENTS - Ostomy and/or Continence Assessment and Care []  - Incontinence Assessment and Management 0 []  - Ostomy Care Assessment and Management (repouching, etc.) 0 PROCESS - Coordination of Care X - Simple Patient / Family Education for ongoing care 1 15 []  - Complex (extensive) Patient / Family Education for ongoing care 0 X - Staff obtains Programmer, systems, Records, Test Results / Process Orders 1 10 []  - Staff telephones HHA, Nursing Homes / Clarify orders / etc 0 [name], [name] ([id number]) []  - Routine Transfer to another Facility (non-emergent condition) 0 []  - Routine Hospital Admission (non-emergent condition) 0 []  - New Admissions / Biomedical engineer / Ordering NPWT, Apligraf, etc. 0 []  - Emergency Hospital Admission (emergent condition) 0 X - Simple Discharge Coordination 1 10 []  - Complex (extensive) Discharge Coordination 0 PROCESS - Special Needs []  - Pediatric / Minor Patient Management 0 []  - Isolation Patient Management 0 []  - Hearing / Language / Visual special needs 0 []  - Assessment of Community assistance (transportation, D/C planning, etc.) 0 []  - Additional assistance / Altered mentation 0 []  - Support Surface(s) Assessment (bed, cushion, seat, etc.) 0 INTERVENTIONS - Wound Cleansing /  Measurement X - Simple Wound Cleansing - one wound 1 5 []  - Complex Wound Cleansing - multiple wounds 0 X - Wound Imaging (photographs - any  number of wounds) 1 5 []  - Wound Tracing (instead of photographs) 0 X - Simple Wound Measurement - one wound 1 5 []  - Complex Wound Measurement - multiple wounds 0 INTERVENTIONS - Wound Dressings []  - Small Wound Dressing one or multiple wounds 0 []  - Medium Wound Dressing one or multiple wounds 0 []  - Large Wound Dressing one or multiple wounds 0 []  - Application of Medications - topical 0 []  - Application of Medications - injection 0 [name], [name]. ([id number]) INTERVENTIONS - Miscellaneous []  - External ear exam 0 []  - Specimen Collection (cultures, biopsies, blood, body fluids, etc.) 0 []  - Specimen(s) / Culture(s) sent or taken to Lab for analysis 0 []  - Patient Transfer (multiple staff / Civil Service fast streamer / Similar devices) 0 []  - Simple Staple / Suture removal (25 or less) 0 []  - Complex Staple / Suture removal (26 or more) 0 []  - Hypo / Hyperglycemic Management (close monitor of Blood Glucose) 0 []  - Ankle / Brachial Index (ABI) - do not check if billed separately 0 X - Vital Signs 1 5 Has the patient been seen at the hospital within the last three years: Yes Total Score: 65 Level Of Care: New/Established - Level 2 Electronic Signature(s) Signed: 03/20/2016 5:11:18 PM By: Gretta Cool, RN, BSN, Kim RN, BSN Entered By: Gretta Cool, RN, BSN, Kim on 03/20/2016 08:15:12 [name] ([id number]) -------------------------------------------------------------------------------- Encounter Discharge Information Details Patient Name: [name], [name]. Date of Service: 03/20/2016 8:00 AM Medical Record Patient Account Number: [id number] Number: Treating RN: Cornell Barman September 13, 1960 (54 y.o. Other Clinician: Date of Birth/Sex: Male) Treating ROBSON, MICHAEL Primary Care Physician: Buddy Duty, JEFFREY Physician/Extender: G Referring Physician: Buddy Duty  JEFFREY Weeks in Treatment: 2 Encounter Discharge Information Items Discharge Pain Level: 0 Discharge Condition: Stable Ambulatory Status: Ambulatory Discharge Destination: Home Transportation: Private Auto Accompanied By: self Schedule Follow-up Appointment: No Medication Reconciliation completed and provided to Patient/Care Yes Audrie Kuri: Provided on Clinical Summary of Care: 03/20/2016 Form Type Recipient Paper Patient HP Electronic Signature(s) Signed: 03/20/2016 8:21:35 AM By: Ruthine Dose Entered By: Ruthine Dose on 03/20/2016 08:21:35 [name] ([id number]) -------------------------------------------------------------------------------- Lower Extremity Assessment Details Patient Name: [name]. Date of Service: 03/20/2016 8:00 AM Medical Record Patient Account Number: [id number] Number: Treating RN: Cornell Barman 12-Aug-1960 (54 y.o. Other Clinician: Date of Birth/Sex: Male) Treating ROBSON, [place] Primary Care Physician: Buddy Duty, JEFFREY Physician/Extender: G Referring Physician: Buddy Duty, JEFFREY Weeks in Treatment: 2 Vascular Assessment Pulses: Posterior Tibial Dorsalis Pedis Palpable: [Left:Yes] Extremity colors, hair growth, and conditions: Extremity Color: [Left:Hyperpigmented] Hair Growth on Extremity: [Left:Yes] Temperature of Extremity: [Left:Warm] Capillary Refill: [Left:< 3 seconds] Dependent Rubor: [Left:No] Blanched when Elevated: [Left:No] Lipodermatosclerosis: [Left:No] Toe Nail Assessment Left: Right: Thick: No Discolored: Yes Deformed: No Improper Length and Hygiene: No Electronic Signature(s) Signed: 03/20/2016 5:11:18 PM By: Gretta Cool, RN, BSN, Kim RN, BSN Entered By: Gretta Cool, RN, BSN, Kim on 03/20/2016 08:05:11 [name] ([id number]) -------------------------------------------------------------------------------- Multi Wound Chart Details Patient Name: [name]. Date of Service: 03/20/2016 8:00 AM Medical Record  Patient Account Number: [id number] Number: Treating RN: Cornell Barman 12-Aug-1960 (54 y.o. Other Clinician: Date of Birth/Sex: Male) Treating ROBSON, [place] Primary Care Physician: Buddy Duty, JEFFREY Physician/Extender: G Referring Physician: Buddy Duty, JEFFREY Weeks in Treatment: 2 Vital Signs Height(in): 70 Pulse(bpm): 98 Weight(lbs): 260 Blood Pressure 162/84 (mmHg): Body Mass Index(BMI): 37 Temperature(F): 98.1 Respiratory Rate 16 (breaths/min): Photos: [N/A:N/A] Wound Location: Left Foot - Lateral N/A N/A Wounding Event: Gradually Appeared N/A N/A Primary Etiology: Diabetic Wound/Ulcer of N/A  N/A the Lower Extremity Comorbid History: Hypertension, Type II N/A N/A Diabetes, Neuropathy Date Acquired: 02/13/2016 N/A N/A Weeks of Treatment: 2 N/A N/A Wound Status: Open N/A N/A Measurements L x W x D 0.1x0.1x0.1 N/A N/A (cm) Area (cm) : 0.008 N/A N/A Volume (cm) : 0.001 N/A N/A % Reduction in Area: 98.00% N/A N/A % Reduction in Volume: 97.40% N/A N/A Classification: Grade 1 N/A N/A Exudate Amount: Small N/A N/A Exudate Type: Serosanguineous N/A N/A Exudate Color: red, brown N/A N/A Wound Margin: Thickened N/A N/A Granulation Amount: Large (67-100%) N/A N/A Granulation Quality: Pink, Pale N/A N/A [name], [name]. ([id number]) Necrotic Amount: None Present (0%) N/A N/A Exposed Structures: Fascia: No N/A N/A Fat: No Tendon: No Muscle: No Joint: No Bone: No Limited to Skin Breakdown Epithelialization: None N/A N/A Periwound Skin Texture: Callus: Yes N/A N/A Edema: No Excoriation: No Induration: No Crepitus: No Fluctuance: No Friable: No Rash: No Scarring: No Periwound Skin Moist: Yes N/A N/A Moisture: Dry/Scaly: Yes Maceration: No Periwound Skin Color: Atrophie Blanche: No N/A N/A Cyanosis: No Ecchymosis: No Erythema: No Hemosiderin Staining: No Mottled: No Pallor: No Rubor: No Temperature: No Abnormality N/A N/A Tenderness on No N/A  N/A Palpation: Wound Preparation: Ulcer Cleansing: N/A N/A Rinsed/Irrigated with Saline Topical Anesthetic Applied: Other: Lidocaine 4% Treatment Notes Electronic Signature(s) Signed: 03/20/2016 5:11:18 PM By: Gretta Cool, RN, BSN, Kim RN, BSN Entered By: Gretta Cool, RN, BSN, Kim on 03/20/2016 08:11:27 [name] ([id number]) -------------------------------------------------------------------------------- [place] Details Patient Name: [name], [name]. Date of Service: 03/20/2016 8:00 AM Medical Record Patient Account Number: [id number] Number: Treating RN: Cornell Barman [birth year] (54 y.o. Other Clinician: Date of Birth/Sex: Male) Treating ROBSON, MICHAEL Primary Care Physician: Buddy Duty, JEFFREY Physician/Extender: G Referring Physician: Delrae Rend Weeks in Treatment: 2 Active Inactive Electronic Signature(s) Signed: 03/20/2016 5:11:18 PM By: Gretta Cool, RN, BSN, Kim RN, BSN Entered By: Gretta Cool, RN, BSN, Kim on 03/20/2016 11:52:27 [name] ([id number]) -------------------------------------------------------------------------------- Pain Assessment Details Patient Name: [name], [name]. Date of Service: 03/20/2016 8:00 AM Medical Record Patient Account Number: [id number] Number: Treating RN: Cornell Barman 1960/11/15 (54 y.o. Other Clinician: Date of Birth/Sex: Male) Treating ROBSON, MICHAEL Primary Care Physician: Buddy Duty, JEFFREY Physician/Extender: G Referring Physician: Buddy Duty, JEFFREY Weeks in Treatment: 2 Active Problems Location of Pain Severity and Description of Pain Patient Has Paino No Site Locations With Dressing Change: No Pain Management and Medication Current Pain Management: Electronic Signature(s) Signed: 03/20/2016 5:11:18 PM By: Gretta Cool, RN, BSN, Kim RN, BSN Entered By: Gretta Cool, RN, BSN, Kim on 03/20/2016 08:01:37 [name]  ([id number]) -------------------------------------------------------------------------------- Patient/Caregiver Education Details Patient Name: [name]. Date of Service: 03/20/2016 8:00 AM Medical Record Patient Account Number: [id number] Number: Treating RN: Cornell Barman December 09, 1960 (54 y.o. Other Clinician: Date of Birth/Gender: Male) Treating ROBSON, [place] Primary Care Physician: Buddy Duty, JEFFREY Physician/Extender: G Referring Physician: Mackey [place] in Treatment: 2 Education Assessment Education Provided To: Patient Education Topics Provided Offloading: Handouts: Other: continue keep pressure off this area Methods: Demonstration, Explain/Verbal Responses: State content correctly Electronic Signature(s) Signed: 03/20/2016 5:11:18 PM By: Gretta Cool, RN, BSN, Kim RN, BSN Entered By: Gretta Cool, RN, BSN, Kim on 03/20/2016 08:18:01 [name] ([id number]) -------------------------------------------------------------------------------- Wound Assessment Details Patient Name: [name], [name]. Date of Service: 03/20/2016 8:00 AM Medical Record Patient Account Number: [id number] Number: Treating RN: Cornell Barman [birth year] (54 y.o. Other Clinician: Date of Birth/Sex: Male) Treating ROBSON, MICHAEL Primary Care Physician: Buddy Duty, JEFFREY Physician/Extender: G Referring Physician: Buddy Duty, JEFFREY Weeks in Treatment: 2  Wound Status Wound Number: 5 Primary Diabetic Wound/Ulcer of the Lower Etiology: Extremity Wound Location: Left, Lateral Foot Wound Status: Healed - Epithelialized Wounding Event: Gradually Appeared Comorbid Hypertension, Type II Diabetes, Date Acquired: 02/13/2016 History: Neuropathy Weeks Of Treatment: 2 Clustered Wound: No Photos Wound Measurements Length: (cm) 0 % Reduction in Width: (cm) 0 % Reduction in Depth: (cm) 0 Epithelializat Area: (cm) 0 Volume: (cm) 0 Area: 100% Volume: 100% ion: None Wound  Description Classification: Grade 1 Wound Margin: Thickened Exudate Amount: Small Exudate Type: Serosanguineous Exudate Color: red, brown Foul Odor After Cleansing: No Wound Bed Granulation Amount: Large (67-100%) Exposed Structure Granulation Quality: Pink, Pale Fascia Exposed: No Necrotic Amount: None Present (0%) Fat Layer Exposed: No Tendon Exposed: No Muscle Exposed: No Joint Exposed: No [name], [name]. ([id number]) Bone Exposed: No Limited to Skin Breakdown Periwound Skin Texture Texture Color No Abnormalities Noted: No No Abnormalities Noted: No Callus: Yes Atrophie Blanche: No Crepitus: No Cyanosis: No Excoriation: No Ecchymosis: No Fluctuance: No Erythema: No Friable: No Hemosiderin Staining: No Induration: No Mottled: No Localized Edema: No Pallor: No Rash: No Rubor: No Scarring: No Temperature / Pain Moisture Temperature: No Abnormality No Abnormalities Noted: No Dry / Scaly: Yes Maceration: No Moist: Yes Wound Preparation Ulcer Cleansing: Rinsed/Irrigated with Saline Topical Anesthetic Applied: Other: Lidocaine 4%, Electronic Signature(s) Signed: 03/20/2016 5:11:18 PM By: Gretta Cool, RN, BSN, Kim RN, BSN Entered By: Gretta Cool, RN, BSN, Kim on 03/20/2016 08:14:37 [name] ([id number]) -------------------------------------------------------------------------------- [place] Details Patient Name: [name], [name]. Date of Service: 03/20/2016 8:00 AM Medical Record Patient Account Number: [id number] Number: Treating RN: Cornell Barman Nov 24, 1960 (54 y.o. Other Clinician: Date of Birth/Sex: Male) Treating ROBSON, MICHAEL Primary Care Physician: Buddy Duty, JEFFREY Physician/Extender: G Referring Physician: Buddy Duty, JEFFREY Weeks in Treatment: 2 Vital Signs Time Taken: 08:01 Temperature (F): 98.1 Height (in): 70 Pulse (bpm): 98 Weight (lbs): 260 Respiratory Rate (breaths/min): 16 Body Mass Index (BMI): 37.3 Blood Pressure (mmHg):  162/84 Reference Range: 80 - 120 mg / dl Electronic Signature(s) Signed: 03/20/2016 5:11:18 PM By: Gretta Cool, RN, BSN, Kim RN, BSN Entered By: Gretta Cool, RN, BSN, Kim on 03/20/2016 08:01:58

## 2016-04-14 ENCOUNTER — Emergency Department: Payer: Managed Care, Other (non HMO)

## 2016-04-14 ENCOUNTER — Emergency Department
Admission: EM | Admit: 2016-04-14 | Discharge: 2016-04-14 | Disposition: A | Discharge status: Home / Self Care | Payer: Managed Care, Other (non HMO) | Attending: Emergency Medicine | Admitting: Emergency Medicine

## 2016-04-14 DIAGNOSIS — Z794 Long term (current) use of insulin: Secondary | ICD-10-CM | POA: Diagnosis not present

## 2016-04-14 DIAGNOSIS — Z79899 Other long term (current) drug therapy: Secondary | ICD-10-CM | POA: Insufficient documentation

## 2016-04-14 DIAGNOSIS — L089 Local infection of the skin and subcutaneous tissue, unspecified: Secondary | ICD-10-CM | POA: Diagnosis present

## 2016-04-14 DIAGNOSIS — T148XXA Other injury of unspecified body region, initial encounter: Secondary | ICD-10-CM

## 2016-04-14 DIAGNOSIS — I10 Essential (primary) hypertension: Secondary | ICD-10-CM | POA: Diagnosis not present

## 2016-04-14 DIAGNOSIS — E119 Type 2 diabetes mellitus without complications: Secondary | ICD-10-CM | POA: Insufficient documentation

## 2016-04-14 DIAGNOSIS — Z7982 Long term (current) use of aspirin: Secondary | ICD-10-CM | POA: Insufficient documentation

## 2016-04-14 DIAGNOSIS — L03116 Cellulitis of left lower limb: Secondary | ICD-10-CM

## 2016-04-14 LAB — CBC WITH DIFFERENTIAL/PLATELET
BASOS ABS: 0 10*3/uL (ref 0–0.1)
Basophils Relative: 0 %
EOS PCT: 3 %
Eosinophils Absolute: 0.3 10*3/uL (ref 0–0.7)
HEMATOCRIT: 39.5 % — AB (ref 40.0–52.0)
Hemoglobin: 13.8 g/dL (ref 13.0–18.0)
LYMPHS ABS: 2.7 10*3/uL (ref 1.0–3.6)
LYMPHS PCT: 29 %
MCH: 31.4 pg (ref 26.0–34.0)
MCHC: 34.9 g/dL (ref 32.0–36.0)
MCV: 90 fL (ref 80.0–100.0)
MONO ABS: 0.8 10*3/uL (ref 0.2–1.0)
MONOS PCT: 9 %
NEUTROS ABS: 5.4 10*3/uL (ref 1.4–6.5)
Neutrophils Relative %: 59 %
PLATELETS: 255 10*3/uL (ref 150–440)
RBC: 4.39 MIL/uL — ABNORMAL LOW (ref 4.40–5.90)
RDW: 13.9 % (ref 11.5–14.5)
WBC: 9.2 10*3/uL (ref 3.8–10.6)

## 2016-04-14 LAB — COMPREHENSIVE METABOLIC PANEL
ALT: 20 U/L (ref 17–63)
AST: 21 U/L (ref 15–41)
Albumin: 3.7 g/dL (ref 3.5–5.0)
Alkaline Phosphatase: 89 U/L (ref 38–126)
Anion gap: 7 (ref 5–15)
BILIRUBIN TOTAL: 0.4 mg/dL (ref 0.3–1.2)
BUN: 16 mg/dL (ref 6–20)
CO2: 26 mmol/L (ref 22–32)
CREATININE: 0.88 mg/dL (ref 0.61–1.24)
Calcium: 9.3 mg/dL (ref 8.9–10.3)
Chloride: 102 mmol/L (ref 101–111)
Glucose, Bld: 238 mg/dL — ABNORMAL HIGH (ref 65–99)
POTASSIUM: 4.1 mmol/L (ref 3.5–5.1)
Sodium: 135 mmol/L (ref 135–145)
TOTAL PROTEIN: 7.6 g/dL (ref 6.5–8.1)

## 2016-04-14 LAB — SEDIMENTATION RATE: SED RATE: 45 mm/h — AB (ref 0–20)

## 2016-04-14 MED ORDER — CLINDAMYCIN HCL 300 MG PO CAPS: 300.0000 mg | ORAL_CAPSULE | Freq: Three times a day (TID) | ORAL | 0 refills | Status: DC

## 2016-04-14 MED ORDER — VANCOMYCIN HCL IN DEXTROSE 1-5 GM/200ML-% IV SOLN
1000.0000 mg | Freq: Once | INTRAVENOUS | Status: AC
  Administered 2016-04-14: 1000 mg via INTRAVENOUS
  Filled 2016-04-14: qty 200

## 2016-04-14 MED ORDER — HYDROMORPHONE HCL 1 MG/ML IJ SOLN
0.5000 mg | Freq: Once | INTRAMUSCULAR | Status: DC
  Filled 2016-04-14: qty 1

## 2016-04-14 MED ORDER — OXYCODONE-ACETAMINOPHEN 7.5-325 MG PO TABS: 1.0000 | ORAL_TABLET | ORAL | 0 refills | Status: DC | PRN

## 2016-04-14 NOTE — ED Triage Notes (Signed)
Pt reports having surgery in left foot in July but continued infections. Pt with odor, reddish, yellow discharge from wound on left foot. Denies fever.

## 2016-04-14 NOTE — ED Provider Notes (Signed)
[facility] Emergency Department Provider Note        Time seen: ----------------------------------------- 8:09 AM on 04/14/2016 -----------------------------------------    I have reviewed the triage vital signs and the nursing notes.   HISTORY  Chief Complaint Wound Infection    HPI [name] is a 55 y.o. male who presents to ER after having had surgery on his left foot in July. Patient has had continued infections, has discharge noted from the left foot.Patient was discharged from the wound care center about a month ago because  it was felt that his left foot was adequately healing. Patient has noticed wound draining from the lateral aspect of his left foot, denies fevers or chills. Patient also notes he's not been taking his insulin because he can't afford it. Left foot is severely painful, worse when he ambulates.   Past Medical History:  Diagnosis Date  . Arthritis   . BPH (benign prostatic hypertrophy)   . Diabetes mellitus   . Diabetic neuropathy ([place])   . Hypertension   . Metatarsal bone fracture    left 5th toe  . Osteomyelitis of toe of right foot ([place])   . Post-operative infection    and diabetic ulcer left foot  . Sleep apnea     does not wear CPAP  . Wears glasses     Patient Active Problem List   Diagnosis Date Noted  . Surgery, elective   . Hardware complicating wound infection ([place])   . Diabetes mellitus due to underlying condition, uncontrolled, with diabetic neuropathy ([place])   . Metatarsal stress fracture of left foot 01/12/2016  . Diabetes mellitus due to underlying condition with diabetic polyneuropathy ([place]) 01/12/2016  . Diabetic infection of left foot ([place]) 01/12/2016  . Diabetic ulcer of foot associated with type 2 diabetes mellitus, with necrosis of muscle ([place]) 01/11/2016    Past Surgical History:  Procedure Laterality Date  . COLONOSCOPY W/ BIOPSIES AND POLYPECTOMY    . I&D EXTREMITY Left 01/11/2016   Procedure: LEFT FOOT IRRIGATION AND DEBRIDEMENT WOUND VAC AND REMOVAL OF HARDWARE;  Surgeon: Wylene Simmer, MD;  Location: [place];  Service: Orthopedics;  Laterality: Left;  . ORIF TOE FRACTURE Left 06/08/2015   Procedure: OPEN REDUCTION INTERNAL FIXATION (ORIF) LEFT FIFTH METATARSAL BASE FRACTURE NONUNION; CALCANEAL AUTOGRAFT ;  Surgeon: Wylene Simmer, MD;  Location: [place];  Service: Orthopedics;  Laterality: Left;  . PATELLA RECONSTRUCTION Left 2005  . PROSTATE ABLATION  2014  . TOE AMPUTATION     partial amputation right great toe    Allergies No known allergies  Social History Social History  Substance Use Topics  . Smoking status: Never Smoker  . Smokeless tobacco: Never Used  . Alcohol use Yes     Comment: occasional     Review of Systems Constitutional: Negative for fever. Cardiovascular: Negative for chest pain. Respiratory: Negative for shortness of breath. Gastrointestinal: Negative for abdominal pain, vomiting and diarrhea. Genitourinary: Negative for dysuria. Musculoskeletal: Positive for left foot pain Skin: Positive for wound to left foot with drainage Neurological: Negative for headaches, focal weakness or numbness.  10-point ROS otherwise negative.  ____________________________________________   PHYSICAL EXAM:  VITAL SIGNS: ED Triage Vitals  Enc Vitals Group     BP 04/14/16 0801 (!) 172/89     Pulse Rate 04/14/16 0801 73     Resp 04/14/16 0801 18     Temp 04/14/16 0801 97.8 F (36.6 C)     Temp Source 04/14/16 0801  Oral     SpO2 04/14/16 0801 94 %     Weight 04/14/16 0802 262 lb (118.8 kg)     Height 04/14/16 0802 5\' 10"  (1.778 m)     Head Circumference --      Peak Flow --      Pain Score 04/14/16 0806 8     Pain Loc --      Pain Edu? --      Excl. in [place]? --     Constitutional: Alert and oriented. Well appearing and in no distress. Eyes: Conjunctivae are normal. PERRL. Normal extraocular movements. ENT   Head:  Normocephalic and atraumatic.   Nose: No congestion/rhinnorhea.   Mouth/Throat: Mucous membranes are moist.   Neck: No stridor. Cardiovascular: Normal rate, regular rhythm. No murmurs, rubs, or gallops. Respiratory: Normal respiratory effort without tachypnea nor retractions. Breath sounds are clear and equal bilaterally. No wheezes/rales/rhonchi. Gastrointestinal: Soft and nontender. Normal bowel sounds Musculoskeletal: Nontender with normal range of motion in all extremities. Lateral left foot tenderness, there is extensive callous formation with foul smell and some small purulent drainage. Neurologic:  Normal speech and language. No gross focal neurologic deficits are appreciated.  Skin:  Lateral left midfoot callous with foul smell and wound drainage Psychiatric: Mood and affect are normal. Speech and behavior are normal.  ____________________________________________  ED COURSE:  Pertinent labs & imaging results that were available during my care of the patient were reviewed by me and considered in my medical decision making (see chart for details). Clinical Course  Patient likely with recurrent osteomyelitis. We will assess with basic labs and imaging.  Procedures ____________________________________________   LABS (pertinent positives/negatives)  Labs Reviewed  CBC WITH DIFFERENTIAL/PLATELET - Abnormal; Notable for the following:       Result Value   RBC 4.39 (*)    HCT 39.5 (*)    All other components within normal limits  COMPREHENSIVE METABOLIC PANEL - Abnormal; Notable for the following:    Glucose, Bld 238 (*)    All other components within normal limits  SEDIMENTATION RATE - Abnormal; Notable for the following:    Sed Rate 45 (*)    All other components within normal limits  CULTURE, BLOOD (ROUTINE X 2)  CULTURE, BLOOD (ROUTINE X 2)  AEROBIC/ANAEROBIC CULTURE (SURGICAL/DEEP WOUND)    RADIOLOGY  Left foot x-rays IMPRESSION: 1. Healed fracture of the  left fifth metatarsal bone, status post interval removal of the associated plate and screw fixation hardware.  2. No acute appearing osseous abnormality. No destructive change or new osseous lucency to suggest osteomyelitis.  3. Soft tissue irregularities overlying the fifth metatarsal bone appear similar to the previous exam, presumably postsurgical.  4.  Diffuse soft tissue thickening/edema about the left foot.   ____________________________________________  FINAL ASSESSMENT AND PLAN  Left foot infection  Plan: Patient with labs and imaging as dictated above. No signs for osteomyelitis at this time, he was given a dose of IV vancomycin here. We have obtained a wound culture, he will be given clindamycin. He also has been poorly managing his diabetes for insurance reasons. He was given coupons for insulin to help get his diabetes medicines filled. He is stable for outpatient follow-up.  Earleen Newport, MD   Note: This dictation was prepared with Dragon dictation. Any transcriptional errors that result from this process are unintentional    Earleen Newport, MD 04/14/16 1109

## 2016-04-16 ENCOUNTER — Encounter: Payer: Managed Care, Other (non HMO) | Attending: Internal Medicine | Admitting: Internal Medicine

## 2016-04-16 DIAGNOSIS — L97523 Non-pressure chronic ulcer of other part of left foot with necrosis of muscle: Secondary | ICD-10-CM | POA: Diagnosis not present

## 2016-04-16 DIAGNOSIS — M86672 Other chronic osteomyelitis, left ankle and foot: Secondary | ICD-10-CM | POA: Diagnosis not present

## 2016-04-16 DIAGNOSIS — E11621 Type 2 diabetes mellitus with foot ulcer: Secondary | ICD-10-CM | POA: Diagnosis not present

## 2016-04-16 DIAGNOSIS — E1142 Type 2 diabetes mellitus with diabetic polyneuropathy: Secondary | ICD-10-CM | POA: Insufficient documentation

## 2016-04-16 LAB — GLUCOSE, CAPILLARY: GLUCOSE-CAPILLARY: 272 mg/dL — AB (ref 65–99)

## 2016-04-17 ENCOUNTER — Other Ambulatory Visit
Admission: RE | Admit: 2016-04-17 | Discharge: 2016-04-17 | Disposition: A | Discharge status: Home / Self Care | Payer: Managed Care, Other (non HMO) | Source: Ambulatory Visit | Attending: Internal Medicine | Admitting: Internal Medicine

## 2016-04-17 DIAGNOSIS — E11621 Type 2 diabetes mellitus with foot ulcer: Secondary | ICD-10-CM | POA: Insufficient documentation

## 2016-04-17 DIAGNOSIS — L97523 Non-pressure chronic ulcer of other part of left foot with necrosis of muscle: Secondary | ICD-10-CM | POA: Insufficient documentation

## 2016-04-17 NOTE — Progress Notes (Signed)
[name], [name] ([id number]) Visit Report for 04/16/2016 Chief Complaint Document Details Patient Name: [name], [name]. Date of Service: 04/16/2016 3:15 PM Medical Record Patient Account Number: [id number] Number: Treating RN: Baruch Gouty, RN, BSN, Rita 1961-03-30 [phone] y.o. Other Clinician: Date of Birth/Sex: Male) Treating ROBSON, MICHAEL Primary Care Physician/Extender: Rose Fillers, JEFFREY Physician: Referring Physician: Buddy Duty, JEFFREY Weeks in Treatment: 5 Information Obtained from: Patient Chief Complaint Chronic left calf traumatic ulcer (healed). Left third toe ulcer (healed). New right calf ulcer. 10/25/15; patient returns today with a wound over his left lateral foot 03/06/16; patient returns today after further surgery on the left foot as well as a 6 week course of IV antibiotics Electronic Signature(s) Signed: 04/17/2016 8:01:59 AM By: [name] MD Entered By: [name] on 04/16/2016 16:29:54 [name] ([id number]) -------------------------------------------------------------------------------- Debridement Details Patient Name: [name]. Date of Service: 04/16/2016 3:15 PM Medical Record Patient Account Number: [id number] Number: Treating RN: Baruch Gouty, RN, BSN, Rita Apr 27, 1961 [phone] y.o. Other Clinician: Date of Birth/Sex: Male) Treating ROBSON, MICHAEL Primary Care Physician/Extender: Rose Fillers, JEFFREY Physician: Referring Physician: Buddy Duty, JEFFREY Weeks in Treatment: 5 Debridement Performed for Wound #6 Left,Lateral Foot Assessment: Performed By: Physician Ricard Dillon, MD Debridement: Debridement Pre-procedure Yes - 16:08 Verification/Time Out Taken: Start Time: 16:08 Pain Control: Lidocaine 4% Topical Solution Level: Skin/Subcutaneous Tissue Total Area Debrided (L x 2 (cm) x 3 (cm) = 6 (cm) W): Tissue and other Non-Viable, Callus, Exudate, Fibrin/Slough, Skin, Subcutaneous material debrided: Instrument: Blade,  Forceps Specimen: Swab Number of Specimens 1 Taken: Bleeding: Moderate Hemostasis Achieved: Pressure End Time: 16:15 Procedural Pain: 0 Post Procedural Pain: 0 Response to Treatment: Procedure was tolerated well Post Debridement Measurements of Total Wound Length: (cm) 2 Width: (cm) 3 Depth: (cm) 0.2 Volume: (cm) 0.942 Character of Wound/Ulcer Post Requires Further Debridement Debridement: Severity of Tissue Post Debridement: Fat layer exposed Post Procedure Diagnosis Same as Pre-procedure RUBEL, BLONDIN ([id number]) Electronic Signature(s) Signed: 04/16/2016 5:26:45 PM By: Regan Lemming BSN, RN Signed: 04/17/2016 8:01:59 AM By: [name] MD Entered By: [name] on 04/16/2016 16:29:36 [name] ([id number]) -------------------------------------------------------------------------------- HPI Details Patient Name: [name], [name]. Date of Service: 04/16/2016 3:15 PM Medical Record Patient Account Number: [id number] Number: Treating RN: Baruch Gouty, RN, BSN, Rita 11/19/1960 [phone] y.o. Other Clinician: Date of Birth/Sex: Male) Treating ROBSON, MICHAEL Primary Care Physician/Extender: Rose Fillers, JEFFREY Physician: Referring Physician: Buddy Duty, JEFFREY Weeks in Treatment: 5 History of Present Illness HPI Description: Pleasant 55 year old with history of diabetes (Hgb A1c 10.8 in 2014) and peripheral neuropathy. No PVD. L ABI 1.1. Status post right great toe partial amputation years ago. He was at work and on 10/22/2014, was injured by a cart, and suffered an ulceration to his left anterior calf. He says that it subsequently became infected, and he was treated with a course of antibiotics. He was found on initial exam to have an ulceration on the dorsum of his left third toe. He was unaware of this and attributes it to pressure from his steel toed boots. More recently he injured his right anterior calf on a cart. Ambulating normally per his baseline. He has  been undergoing regular debridements, applying mupirocin cream, and an Ace wrap for edema control. He returns to clinic for follow-up and is without complaints. No pain. No fever or chills. No drainage. 10/25/15; this is a 55 year old man who has type II diabetes with diabetic polyneuropathy. He tells me that he fractured his left fifth metatarsal in June 2016 when  he presented with swelling. He does not recall a specific injury. His hemoglobin A1c was apparently too high at the time for consideration of surgery and he was put in some form of offloading. Ultimately he went to surgery in December with an allograft from his calcaneus to this site, plate and screws. He had an x-ray of the foot in March that showed concerns about nonunion. He tells me that in March he had to move and basically moved himself. He was on his foot a lot and then noticed some drainage from an open area. He has been following with his orthopedic surgeon Dr. Doran Durand. He has been applying a felt donut, dry dressing and using his heel healing sandal. 11/01/15; this is a patient I saw last week for the first time. He had a small open wound on the plantar aspect of his left foot at roughly the level of the base of his fifth metatarsal. He had a considerable degree of thickened skin around this wound on the plantar aspect which I thought was from chronic pressure on this area. He tells [place] that he had drainage over the course of the week. No systemic symptoms. 11/08/15; culture last week grew Citrobacter korseri. This should've been sensitive to the Augmentin I gave him. He has seen Dr. Doran Durand who did his initial surgery and according the patient the plan is to give this another month and then the hardware might need to come out of this. This seems like a reasonable plan. I will adjust his antibiotics to ciprofloxacin which probably should continue for at least another 2 weeks. I gave him 10 days worth today 11/22/15 the patient has  completed antibiotics. He has an appointment with Dr. Doran Durand this Friday. There is improved dimensions around the wound on the left fifth metatarsal base 11/29/15; the patient has completed antibiotics last week. Apparently his appointment with Dr. Doran Durand it is not until this Friday. Dimensions are roughly the same. 12/06/15; saw Dr. Doran Durand. No x-ray told the end of the month, next appointment June 30. We have been using Aquacel Ag 12/13/15: No major change this week. Using Aquacel AG DON, SAUVAGEAU. ([id number]) 621/17; arrived this week with maceration around the wound. There was quite a bit of undermining which required surgical debridement. I changed him to [facility] last week, by the patient's admission he was up on this more this week 12/27/15; macerated tissue around the wound is removed with a scalpel and pickups. There is no undermining. Nonviable subcutaneous tissue and skin taken from the superior circumference of the wound is slough from the surface. READMISSION 03/06/16 since I last saw this patient at the end of June, he went for surgery on 01/11/16 by Dr. Doran Durand of orthopedics. He had a left foot irrigation and debridement, removal of hardware and placement of wound VAC. He is also been followed by Dr. Megan Salon of infectious disease and completed a six-week course of IV Rocephin for group B strep and Enterobacter in the bone at the time of surgery. Apparently at the time of surgery the bone looked healthy so I don't think any bone was actually removed. He has been using silver alginate based dressings on the same wound area at the base of the left fifth metatarsal on the left. I note that he is also had arterial studies on 01/08/16, these showed a left ABI of 1.2 to and a right ABI of 1.3. Waveforms were listed as biphasic. He was not felt to have any specific arterial  issues. 03/13/16; no real change in the condition of the wound at the left lateral foot at roughly the base of his  fifth metatarsal. Use silver alginate last week. 03/18/16 arrives today with no open area. Being suspicious of the overlying callus I. Some of this back although I see nothing but covering tissue here/epithelium. There is no surrounding tenderness READMISSION 04/16/16 this is a patient I discharged about a month ago. Initially a surgical wound on the lateral aspect of his left foot which subsequently became infected. The story he is giving today that he went back into his own modified shoe started to notice pain 2 weeks ago he was seen in Dr. Nona Dell office by a physician assistant last Wednesday and according the patient was told that everything looked fine however this is clearly now broken open and he has an open wound in the same spot that we have been dealing with repetitively. Situation is complicated by the fact that he is running short of money on long-term disability. He has not taken his insulin and at least 2 weeks was previously on NovoLog short acting insulin on a sliding scale and TRESIBA 35 units at bedtime. He is no longer able to afford any of his medication he was in the x-ray on 10/15. A plain x-ray showed healed fracture of the left fifth metatarsal bone status post removal of the associated plate and screw fixation hardware. There was no acute appearing osseous abnormality. His blood work showed a white count of 9.2 with an essentially normal differential comprehensive metabolic panel was normal. Previous CT scan of the foot on 01/08/16 showed no osteomyelitis previous vascular workup showed no evidence of significant PAD on 01/12/69 Electronic Signature(s) Signed: 04/17/2016 8:01:59 AM By: [name] MD Entered By: [name] on 04/16/2016 16:41:26 [name] ([id number]) -------------------------------------------------------------------------------- Physical Exam Details Patient Name: [name]. Date of Service: 04/16/2016 3:15 PM Medical Record  Patient Account Number: [id number] Number: Treating RN: Baruch Gouty, RN, BSN, Rita 05/22/1961 [phone] y.o. Other Clinician: Date of Birth/Sex: Male) Treating ROBSON, MICHAEL Primary Care Physician/Extender: Rose Fillers, JEFFREY Physician: Referring Physician: Buddy Duty, JEFFREY Weeks in Treatment: 5 Constitutional Patient is hypertensive.. Pulse regular and within target range for patient.Marland Kitchen Respirations regular, non-labored and within target range.. Temperature is normal and within the target range for the patient.. Patient does not appear to be systemically unwell. Notes Wound exam; the area in question is on the lateral aspect of the left foot in the same position as previously. Thick callus with malodorous subcutaneous tissue removed with pickups and scalpel. The wound extends inferiorly for at least a centimeter or 2 into the plantar surface of his foot. I did not fully debridement this area. On the lateral aspect of his foot epithelialization appears intact except for a small open area. A culture was done of the area inferiorly Electronic Signature(s) Signed: 04/17/2016 8:01:59 AM By: [name] MD Entered By: [name] on 04/16/2016 16:34:10 [name] ([id number]) -------------------------------------------------------------------------------- Physician Orders Details Patient Name: [name]. Date of Service: 04/16/2016 3:15 PM Medical Record Patient Account Number: [id number] Number: Treating RN: Baruch Gouty, RN, BSN, Rita Jun 25, 1961 [phone] y.o. Other Clinician: Date of Birth/Sex: Male) Treating ROBSON, MICHAEL Primary Care Physician/Extender: Rose Fillers, JEFFREY Physician: Referring Physician: Mackey Birchwood in Treatment: 5 Verbal / Phone Orders: Yes Clinician: Afful, RN, BSN, Rita Read Back and Verified: Yes Diagnosis Coding Blood Glucose Testing Wound #6 Left,Lateral Foot o Finger stick in clinic as a component of the  assessment of chronic  ulcer Wound Cleansing Wound #6 Left,Lateral Foot o Cleanse wound with mild soap and water o May Shower, gently pat wound dry prior to applying new dressing. o May shower with protection. Anesthetic Wound #6 Left,Lateral Foot o Topical Lidocaine 4% cream applied to wound bed prior to debridement Primary Wound Dressing Wound #6 Left,Lateral Foot o Sorbalgon Ag Secondary Dressing Wound #6 Left,Lateral Foot o Gauze and Kerlix/Conform Dressing Change Frequency Wound #6 Left,Lateral Foot o Change dressing every day. Follow-up Appointments Wound #6 Left,Lateral Foot o Return Appointment in 1 week. Off-Loading Wound #6 Left,Lateral Foot JOBEY, CONG. ([id number]) o Open toe surgical shoe with peg assist. Additional Orders / Instructions Wound #6 Left,Lateral Foot o Increase protein intake. o Activity as tolerated Laboratory o Bacteria identified in Wound by Culture (MICRO) - left foot oooo LOINC Code: [id number] oooo Convenience Name: Wound culture routine Electronic Signature(s) Signed: 04/16/2016 5:26:45 PM By: Regan Lemming BSN, RN Signed: 04/17/2016 8:01:59 AM By: [name] MD Entered By: Regan Lemming on 04/16/2016 16:21:53 [name] ([id number]) -------------------------------------------------------------------------------- Problem List Details Patient Name: FINNIGAN, ORBIN. Date of Service: 04/16/2016 3:15 PM Medical Record Patient Account Number: [id number] Number: Treating RN: Baruch Gouty, RN, BSN, Rita 19-Mar-1961 [phone] y.o. Other Clinician: Date of Birth/Sex: Male) Treating ROBSON, MICHAEL Primary Care Physician/Extender: Rose Fillers, JEFFREY Physician: Referring Physician: Buddy Duty, JEFFREY Weeks in Treatment: 5 Active Problems ICD-10 Encounter Code Description Active Date Diagnosis E11.621 Type 2 diabetes mellitus with foot ulcer 03/06/2016 Yes L97.523 Non-pressure chronic ulcer of other part of left foot with 03/06/2016 Yes necrosis  of muscle M86.672 Other chronic osteomyelitis, left ankle and foot 03/06/2016 Yes Inactive Problems Resolved Problems Electronic Signature(s) Signed: 04/17/2016 8:01:59 AM By: [name] MD Entered By: [name] on 04/16/2016 16:22:41 [name] ([id number]) -------------------------------------------------------------------------------- Progress Note Details Patient Name: [name]. Date of Service: 04/16/2016 3:15 PM Medical Record Patient Account Number: [id number] Number: Treating RN: Baruch Gouty, RN, BSN, Rita [birth year] [phone] y.o. Other Clinician: Date of Birth/Sex: Male) Treating ROBSON, MICHAEL Primary Care Physician/Extender: Rose Fillers, JEFFREY Physician: Referring Physician: Buddy Duty, JEFFREY Weeks in Treatment: 5 Subjective Chief Complaint Information obtained from Patient Chronic left calf traumatic ulcer (healed). Left third toe ulcer (healed). New right calf ulcer. 10/25/15; patient returns today with a wound over his left lateral foot 03/06/16; patient returns today after further surgery on the left foot as well as a 6 week course of IV antibiotics History of Present Illness (HPI) Pleasant 55 year old with history of diabetes (Hgb A1c 10.8 in 2014) and peripheral neuropathy. No PVD. L ABI 1.1. Status post right great toe partial amputation years ago. He was at work and on 10/22/2014, was injured by a cart, and suffered an ulceration to his left anterior calf. He says that it subsequently became infected, and he was treated with a course of antibiotics. He was found on initial exam to have an ulceration on the dorsum of his left third toe. He was unaware of this and attributes it to pressure from his steel toed boots. More recently he injured his right anterior calf on a cart. Ambulating normally per his baseline. He has been undergoing regular debridements, applying mupirocin cream, and an Ace wrap for edema control. He returns to clinic for follow-up  and is without complaints. No pain. No fever or chills. No drainage. 10/25/15; this is a 55 year old man who has type II diabetes with diabetic polyneuropathy. He tells me that he fractured his left fifth metatarsal in June  2016 when he presented with swelling. He does not recall a specific injury. His hemoglobin A1c was apparently too high at the time for consideration of surgery and he was put in some form of offloading. Ultimately he went to surgery in December with an allograft from his calcaneus to this site, plate and screws. He had an x-ray of the foot in March that showed concerns about nonunion. He tells me that in March he had to move and basically moved himself. He was on his foot a lot and then noticed some drainage from an open area. He has been following with his orthopedic surgeon Dr. Doran Durand. He has been applying a felt donut, dry dressing and using his heel healing sandal. 11/01/15; this is a patient I saw last week for the first time. He had a small open wound on the plantar aspect of his left foot at roughly the level of the base of his fifth metatarsal. He had a considerable degree of thickened skin around this wound on the plantar aspect which I thought was from chronic pressure on this area. He tells [place] that he had drainage over the course of the week. No systemic symptoms. 11/08/15; culture last week grew Citrobacter korseri. This should've been sensitive to the Augmentin I gave him. He has seen Dr. Doran Durand who did his initial surgery and according the patient the plan is to give this another month and then the hardware might need to come out of this. This seems like a reasonable plan. I will adjust his antibiotics to ciprofloxacin which probably should continue for at least another 2 weeks. I gave COWEN, JEWART ([id number]) him 10 days worth today 11/22/15 the patient has completed antibiotics. He has an appointment with Dr. Doran Durand this Friday. There is improved dimensions  around the wound on the left fifth metatarsal base 11/29/15; the patient has completed antibiotics last week. Apparently his appointment with Dr. Doran Durand it is not until this Friday. Dimensions are roughly the same. 12/06/15; saw Dr. Doran Durand. No x-ray told the end of the month, next appointment June 30. We have been using Aquacel Ag 12/13/15: No major change this week. Using Aquacel AG 621/17; arrived this week with maceration around the wound. There was quite a bit of undermining which required surgical debridement. I changed him to [facility] last week, by the patient's admission he was up on this more this week 12/27/15; macerated tissue around the wound is removed with a scalpel and pickups. There is no undermining. Nonviable subcutaneous tissue and skin taken from the superior circumference of the wound is slough from the surface. READMISSION 03/06/16 since I last saw this patient at the end of June, he went for surgery on 01/11/16 by Dr. Doran Durand of orthopedics. He had a left foot irrigation and debridement, removal of hardware and placement of wound VAC. He is also been followed by Dr. Megan Salon of infectious disease and completed a six-week course of IV Rocephin for group B strep and Enterobacter in the bone at the time of surgery. Apparently at the time of surgery the bone looked healthy so I don't think any bone was actually removed. He has been using silver alginate based dressings on the same wound area at the base of the left fifth metatarsal on the left. I note that he is also had arterial studies on 01/08/16, these showed a left ABI of 1.2 to and a right ABI of 1.3. Waveforms were listed as biphasic. He was not felt to have any  specific arterial issues. 03/13/16; no real change in the condition of the wound at the left lateral foot at roughly the base of his fifth metatarsal. Use silver alginate last week. 03/18/16 arrives today with no open area. Being suspicious of the overlying callus I.  Some of this back although I see nothing but covering tissue here/epithelium. There is no surrounding tenderness READMISSION 04/16/16 this is a patient I discharged about a month ago. Initially a surgical wound on the lateral aspect of his left foot which subsequently became infected. The story he is giving today that he went back into his own modified shoe started to notice pain 2 weeks ago he was seen in Dr. Nona Dell office by a physician assistant last Wednesday and according the patient was told that everything looked fine however this is clearly now broken open and he has an open wound in the same spot that we have been dealing with repetitively. Situation is complicated by the fact that he is running short of money on long-term disability. He has not taken his insulin and at least 2 weeks was previously on NovoLog short acting insulin on a sliding scale and TRESIBA 35 units at bedtime. He is no longer able to afford any of his medication Objective Constitutional Patient is hypertensive.. Pulse regular and within target range for patient.Marland Kitchen Respirations regular, non-labored and within target range.. Temperature is normal and within the target range for the patient.. Patient does not appear to be systemically unwell. JOJUAN, ALVERSON ([id number]) Vitals Time Taken: 3:34 PM, Height: 70 in, Weight: 260 lbs, BMI: 37.3, Temperature: 98.1 F, Pulse: 92 bpm, Respiratory Rate: 17 breaths/min, Blood Pressure: 163/85 mmHg. General Notes: Wound exam; the area in question is on the lateral aspect of the left foot in the same position as previously. Thick callus with malodorous subcutaneous tissue removed with pickups and scalpel. The wound extends inferiorly for at least a centimeter or 2 into the plantar surface of his foot. I did not fully debridement this area. On the lateral aspect of his foot epithelialization appears intact except for a small open area. A culture was done of the area  inferiorly Integumentary (Hair, Skin) Wound #6 status is Open. Original cause of wound was Gradually Appeared. The wound is located on the Left,Lateral Foot. The wound measures 2cm length x 3cm width x 0.2cm depth; 4.712cm^2 area and 0.942cm^3 volume. The wound is limited to skin breakdown. There is no tunneling or undermining noted. There is a large amount of serosanguineous drainage noted. The wound margin is thickened. There is no granulation within the wound bed. There is a large (67-100%) amount of necrotic tissue within the wound bed including Eschar and Adherent Slough. The periwound skin appearance exhibited: Maceration, Moist. The periwound skin appearance did not exhibit: Callus, Crepitus, Excoriation, Fluctuance, Friable, Induration, Localized Edema, Rash, Scarring, Dry/Scaly, Atrophie Blanche, Cyanosis, Ecchymosis, Hemosiderin Staining, Mottled, Pallor, Rubor, Erythema. Periwound temperature was noted as No Abnormality. The periwound has tenderness on palpation. Assessment Active Problems ICD-10 E11.621 - Type 2 diabetes mellitus with foot ulcer L97.523 - Non-pressure chronic ulcer of other part of left foot with necrosis of muscle M86.672 - Other chronic osteomyelitis, left ankle and foot Procedures Wound #6 Wound #6 is a Diabetic Wound/Ulcer of the Lower Extremity located on the Left,Lateral Foot . There was a Skin/Subcutaneous Tissue Debridement [id number]) debridement with total area of 6 sq cm performed by Ricard Dillon, MD. with the following instrument(s): Blade and Forceps to remove Non-Viable tissue/material including Exudate,  Fibrin/Slough, Skin, Callus, and Subcutaneous after achieving pain control using Lidocaine 4% Topical Solution. 1 Specimen was taken by a Swab and sent to the lab per facility protocol.A time out was conducted at 16:08, prior to the start of the procedure. A Moderate amount ZAKARI, SPRAU. ([id number]) of bleeding was controlled with  Pressure. The procedure was tolerated well with a pain level of 0 throughout and a pain level of 0 following the procedure. Post Debridement Measurements: 2cm length x 3cm width x 0.2cm depth; 0.942cm^3 volume. Character of Wound/Ulcer Post Debridement requires further debridement. Severity of Tissue Post Debridement is: Fat layer exposed. Post procedure Diagnosis Wound #6: Same as Pre-Procedure Plan Blood Glucose Testing: Wound #6 Left,Lateral Foot: Finger stick in clinic as a component of the assessment of chronic ulcer Wound Cleansing: Wound #6 Left,Lateral Foot: Cleanse wound with mild soap and water May Shower, gently pat wound dry prior to applying new dressing. May shower with protection. Anesthetic: Wound #6 Left,Lateral Foot: Topical Lidocaine 4% cream applied to wound bed prior to debridement Primary Wound Dressing: Wound #6 Left,Lateral Foot: Sorbalgon Ag Secondary Dressing: Wound #6 Left,Lateral Foot: Gauze and Kerlix/Conform Dressing Change Frequency: Wound #6 Left,Lateral Foot: Change dressing every day. Follow-up Appointments: Wound #6 Left,Lateral Foot: Return Appointment in 1 week. Off-Loading: Wound #6 Left,Lateral Foot: Open toe surgical shoe with peg assist. Additional Orders / Instructions: Wound #6 Left,Lateral Foot: Increase protein intake. Activity as tolerated Laboratory ordered were: Wound culture routine - left foot GERALD, HARDAWAY ([id number]) 1) I am hopeful that all of this is a pressure injury from his boot however an infection can't be excluded 2) continue the clindamycin prescribed in the ER 3) i did a culture post debridement, I understand he had a culture done in the ER as well 4 silver alginate to the wound Electronic Signature(s) Signed: 04/17/2016 8:01:59 AM By: [name] MD Entered By: [name] on 04/16/2016 16:38:50 [name]  ([id number]) -------------------------------------------------------------------------------- SuperBill Details Patient Name: [name]. Date of Service: 04/16/2016 Medical Record Patient Account Number: [id number] Number: Treating RN: Baruch Gouty, RN, BSN, Rita Jul 19, 1960 [phone] y.o. Other Clinician: Date of Birth/Sex: Male) Treating ROBSON, [place] Primary Care Physician: Buddy Duty, JEFFREY Physician/Extender: G Referring Physician: Buddy Duty, JEFFREY Weeks in Treatment: 5 Diagnosis Coding ICD-10 Codes Code Description E11.621 Type 2 diabetes mellitus with foot ulcer L97.523 Non-pressure chronic ulcer of other part of left foot with necrosis of muscle M86.672 Other chronic osteomyelitis, left ankle and foot Facility Procedures CPT4 Code: [id number] Description: [id number] - DEB SUBQ TISSUE 20 SQ CM/< ICD-10 Description Diagnosis E11.621 Type 2 diabetes mellitus with foot ulcer Modifier: Quantity: 1 Physician Procedures CPT4 Code: [id number] Description: [id number] - WC PHYS SUBQ TISS 20 SQ CM ICD-10 Description Diagnosis E11.621 Type 2 diabetes mellitus with foot ulcer Modifier: Quantity: 1 Electronic Signature(s) Signed: 04/17/2016 8:01:59 AM By: [name] MD Entered By: [name] on 04/16/2016 16:39:20

## 2016-04-17 NOTE — Progress Notes (Signed)
[name], [name] ([id number]) Visit Report for 04/16/2016 Arrival Information Details Patient Name: [name], [name]. Date of Service: 04/16/2016 3:15 PM Medical Record Patient Account Number: [id number] Number: Treating RN: Baruch Gouty, RN, BSN, Rita 10/11/1960 [phone] y.o. Other Clinician: Date of Birth/Sex: Male) Treating ROBSON, MICHAEL Primary Care Physician: Buddy Duty, JEFFREY Physician/Extender: G Referring Physician: Buddy Duty, JEFFREY Weeks in Treatment: 5 Visit Information History Since Last Visit All ordered tests and consults were completed: No Patient Arrived: Ambulatory Added or deleted any medications: No Arrival Time: 15:31 Any new allergies or adverse reactions: No Accompanied By: self Had a fall or experienced change in No Transfer Assistance: None activities of daily living that may affect Patient Identification Verified: Yes risk of falls: Secondary Verification Process Yes Signs or symptoms of abuse/neglect since last No Completed: visito Patient Requires Transmission-Based No Hospitalized since last visit: No Precautions: Has Dressing in Place as Prescribed: Yes Patient Has Alerts: No Pain Present Now: No Electronic Signature(s) Signed: 04/16/2016 5:26:45 PM By: Regan Lemming BSN, RN Entered By: Regan Lemming on 04/16/2016 15:31:27 [name] ([id number]) -------------------------------------------------------------------------------- Encounter Discharge Information Details Patient Name: [name], [name]. Date of Service: 04/16/2016 3:15 PM Medical Record Patient Account Number: [id number] Number: Treating RN: Baruch Gouty, RN, BSN, Rita 1960-09-02 [phone] y.o. Other Clinician: Date of Birth/Sex: Male) Treating ROBSON, MICHAEL Primary Care Physician: Buddy Duty, JEFFREY Physician/Extender: G Referring Physician: Buddy Duty, JEFFREY Weeks in Treatment: 5 Encounter Discharge Information Items Discharge Pain Level: 0 Discharge Condition: Stable Ambulatory Status:  Ambulatory Discharge Destination: Home Transportation: Private Auto Accompanied By: self Schedule Follow-up Appointment: No Medication Reconciliation completed and provided to Patient/Care No Quincie Haroon: Provided on Clinical Summary of Care: 04/16/2016 Form Type Recipient Paper Patient HP Electronic Signature(s) Signed: 04/16/2016 5:26:01 PM By: Regan Lemming BSN, RN Previous Signature: 04/16/2016 4:30:21 PM Version By: Ruthine Dose Entered By: Regan Lemming on 04/16/2016 17:26:01 [name] ([id number]) -------------------------------------------------------------------------------- Lower Extremity Assessment Details Patient Name: [name]. Date of Service: 04/16/2016 3:15 PM Medical Record Patient Account Number: [id number] Number: Treating RN: Baruch Gouty, RN, BSN, Rita 06/27/1961 [phone] y.o. Other Clinician: Date of Birth/Sex: Male) Treating ROBSON, MICHAEL Primary Care Physician: Buddy Duty, JEFFREY Physician/Extender: G Referring Physician: Buddy Duty, JEFFREY Weeks in Treatment: 5 Vascular Assessment Pulses: Posterior Tibial Dorsalis Pedis Palpable: [Left:Yes] Extremity colors, hair growth, and conditions: Extremity Color: [Left:Mottled] Hair Growth on Extremity: [Left:No] Temperature of Extremity: [Left:Warm] Capillary Refill: [Left:< 3 seconds] Toe Nail Assessment Left: Right: Thick: Yes Discolored: Yes Deformed: No Improper Length and Hygiene: No Electronic Signature(s) Signed: 04/16/2016 5:26:45 PM By: Regan Lemming BSN, RN Entered By: Regan Lemming on 04/16/2016 15:31:56 [name] ([id number]) -------------------------------------------------------------------------------- Multi Wound Chart Details Patient Name: [name]. Date of Service: 04/16/2016 3:15 PM Medical Record Patient Account Number: [id number] Number: Treating RN: Baruch Gouty, RN, BSN, Rita March 05, 1961 [phone] y.o. Other Clinician: Date of Birth/Sex: Male) Treating ROBSON,  [place] Primary Care Physician: Buddy Duty, JEFFREY Physician/Extender: G Referring Physician: Buddy Duty, JEFFREY Weeks in Treatment: 5 Vital Signs Height(in): 70 Pulse(bpm): 92 Weight(lbs): 260 Blood Pressure 163/85 (mmHg): Body Mass Index(BMI): 37 Temperature(F): 98.1 Respiratory Rate 17 (breaths/min): Photos: [6:No Photos] [N/A:N/A] Wound Location: [6:Left Foot - Lateral] [N/A:N/A] Wounding Event: [6:Gradually Appeared] [N/A:N/A] Primary Etiology: [6:Diabetic Wound/Ulcer of N/A the Lower Extremity] Comorbid History: [6:Hypertension, Type II Diabetes, Neuropathy] [N/A:N/A] Date Acquired: [6:03/25/2016] [N/A:N/A] Weeks of Treatment: [6:0] [N/A:N/A] Wound Status: [6:Open] [N/A:N/A] Measurements L x W x D 2x3x0.2 [N/A:N/A] (cm) Area (cm) : [6:4.712] [N/A:N/A] Volume (cm) : [6:0.942] [N/A:N/A] Classification: [6:Unable to visualize wound  N/A bed] Exudate Amount: [6:Large] [N/A:N/A] Exudate Type: [6:Serosanguineous] [N/A:N/A] Exudate Color: [6:red, brown] [N/A:N/A] Foul Odor After [6:Yes] [N/A:N/A] Cleansing: Odor Anticipated Due to No [N/A:N/A] Product Use: Wound Margin: [6:Thickened] [N/A:N/A] Granulation Amount: [6:None Present (0%)] [N/A:N/A] Necrotic Amount: [6:Large (67-100%)] [N/A:N/A] Necrotic Tissue: [6:Eschar, Adherent Slough N/A] Exposed Structures: [N/A:N/A] Fascia: No Fat: No Tendon: No Muscle: No Joint: No Bone: No Limited to Skin Breakdown Epithelialization: None N/A N/A Periwound Skin Texture: Edema: No N/A N/A Excoriation: No Induration: No Callus: No Crepitus: No Fluctuance: No Friable: No Rash: No Scarring: No Periwound Skin Maceration: Yes N/A N/A Moisture: Moist: Yes Dry/Scaly: No Periwound Skin Color: Atrophie Blanche: No N/A N/A Cyanosis: No Ecchymosis: No Erythema: No Hemosiderin Staining: No Mottled: No Pallor: No Rubor: No Temperature: No Abnormality N/A N/A Tenderness on Yes N/A N/A Palpation: Wound Preparation: Ulcer  Cleansing: N/A N/A Rinsed/Irrigated with Saline Topical Anesthetic Applied: Other: Lidocaine 4% Treatment Notes Electronic Signature(s) Signed: 04/16/2016 5:26:45 PM By: Regan Lemming BSN, RN Entered By: Regan Lemming on 04/16/2016 16:09:19 [name] ([id number]) -------------------------------------------------------------------------------- [place] Details Patient Name: [name], [name]. Date of Service: 04/16/2016 3:15 PM Medical Record Patient Account Number: [id number] Number: Treating RN: Baruch Gouty, RN, BSN, Rita [birth year] [phone] y.o. Other Clinician: Date of Birth/Sex: Male) Treating ROBSON, [place] Primary Care Physician: Buddy Duty, JEFFREY Physician/Extender: G Referring Physician: Buddy Duty, JEFFREY Weeks in Treatment: 5 Active Inactive Orientation to the Wound Care Program Nursing Diagnoses: Knowledge deficit related to the wound healing center program Goals: Patient/caregiver will verbalize understanding of the [place] Program Date Initiated: 04/16/2016 Goal Status: Active Interventions: Provide education on orientation to the wound center Notes: Wound/Skin Impairment Nursing Diagnoses: Impaired tissue integrity Knowledge deficit related to ulceration/compromised skin integrity Goals: Patient/caregiver will verbalize understanding of skin care regimen Date Initiated: 04/16/2016 Goal Status: Active Ulcer/skin breakdown will have a volume reduction of 30% by week 4 Date Initiated: 04/16/2016 Goal Status: Active Ulcer/skin breakdown will have a volume reduction of 50% by week 8 Date Initiated: 04/16/2016 Goal Status: Active Ulcer/skin breakdown will have a volume reduction of 80% by week 12 Date Initiated: 04/16/2016 Goal Status: Active Ulcer/skin breakdown will heal within 14 weeks [name], [name] ([id number]) Date Initiated: 04/16/2016 Goal Status: Active Interventions: Assess patient/caregiver ability to obtain  necessary supplies Assess patient/caregiver ability to perform ulcer/skin care regimen upon admission and as needed Assess ulceration(s) every visit Provide education on ulcer and skin care Treatment Activities: Skin care regimen initiated : 03/06/2016 Topical wound management initiated : 03/06/2016 Notes: Electronic Signature(s) Signed: 04/16/2016 5:26:45 PM By: Regan Lemming BSN, RN Entered By: Regan Lemming on 04/16/2016 16:08:59 [name] ([id number]) -------------------------------------------------------------------------------- Pain Assessment Details Patient Name: [name]. Date of Service: 04/16/2016 3:15 PM Medical Record Patient Account Number: [id number] Number: Treating RN: Baruch Gouty, RN, BSN, Rita 11-17-1960 [phone] y.o. Other Clinician: Date of Birth/Sex: Male) Treating ROBSON, MICHAEL Primary Care Physician: Buddy Duty, JEFFREY Physician/Extender: G Referring Physician: Buddy Duty, JEFFREY Weeks in Treatment: 5 Active Problems Location of Pain Severity and Description of Pain Patient Has Paino No Site Locations With Dressing Change: No Pain Management and Medication Current Pain Management: Electronic Signature(s) Signed: 04/16/2016 5:26:45 PM By: Regan Lemming BSN, RN Entered By: Regan Lemming on 04/16/2016 15:31:35 [name] ([id number]) -------------------------------------------------------------------------------- Patient/Caregiver Education Details Patient Name: [name]. Date of Service: 04/16/2016 3:15 PM Medical Record Patient Account Number: [id number] Number: Treating RN: Baruch Gouty, RN, BSN, Rita 1960/08/22 [phone] y.o. Other Clinician: Date of Birth/Gender: Male) Treating ROBSON, MICHAEL  Primary Care Physician: Buddy Duty, JEFFREY Physician/Extender: G Referring Physician: Mackey [place]ch[place] in Treatment: 5 Education Assessment Education Provided To: Patient Education Topics Provided Welcome To [facility]: Methods:  Explain/Verbal Responses: State content correctly Wound/Skin Impairment: Methods: Explain/Verbal Responses: State content correctly Electronic Signature(s) Signed: 04/16/2016 5:26:45 PM By: Regan Lemming BSN, RN Entered By: Regan Lemming on 04/16/2016 17:26:15 [name] ([id number]) -------------------------------------------------------------------------------- Wound Assessment Details Patient Name: [name]. Date of Service: 04/16/2016 3:15 PM Medical Record Patient Account Number: [id number] Number: Treating RN: Baruch Gouty, RN, BSN, Rita 1960-11-26 [phone] y.o. Other Clinician: Date of Birth/Sex: Male) Treating ROBSON, [place] Primary Care Physician: Buddy Duty, JEFFREY Physician/Extender: G Referring Physician: Buddy Duty, JEFFREY Weeks in Treatment: 5 Wound Status Wound Number: 6 Primary Diabetic Wound/Ulcer of the Lower Etiology: Extremity Wound Location: Left Foot - Lateral Wound Status: Open Wounding Event: Gradually Appeared Comorbid Hypertension, Type II Diabetes, Date Acquired: 03/25/2016 History: Neuropathy Weeks Of Treatment: 0 Clustered Wound: No Photos Photo Uploaded By: Regan Lemming on 04/16/2016 17:16:53 Wound Measurements Length: (cm) 2 Width: (cm) 3 Depth: (cm) 0.2 Area: (cm) 4.712 Volume: (cm) 0.942 % Reduction in Area: % Reduction in Volume: Epithelialization: None Tunneling: No Undermining: No Wound Description Classification: Unable to visualize wound bed Foul Odor Wound Margin: Thickened Due to Pro Exudate Amount: Large Exudate Type: Serosanguineous Exudate Color: red, brown After Cleansing: Yes duct Use: No Wound Bed Granulation Amount: None Present (0%) Exposed Structure Necrotic Amount: Large (67-100%) Fascia Exposed: No Necrotic Quality: Eschar, Adherent Slough Fat Layer Exposed: No Tendon Exposed: No Muscle Exposed: No [name], [name] ([id number]) Joint Exposed: No Bone Exposed: No Limited to Skin Breakdown Periwound Skin  Texture Texture Color No Abnormalities Noted: No No Abnormalities Noted: No Callus: No Atrophie Blanche: No Crepitus: No Cyanosis: No Excoriation: No Ecchymosis: No Fluctuance: No Erythema: No Friable: No Hemosiderin Staining: No Induration: No Mottled: No Localized Edema: No Pallor: No Rash: No Rubor: No Scarring: No Temperature / Pain Moisture Temperature: No Abnormality No Abnormalities Noted: No Tenderness on Palpation: Yes Dry / Scaly: No Maceration: Yes Moist: Yes Wound Preparation Ulcer Cleansing: Rinsed/Irrigated with Saline Topical Anesthetic Applied: Other: Lidocaine 4%, Treatment Notes Wound #6 (Left, Lateral Foot) 1. Cleansed with: Clean wound with Normal Saline 4. Dressing Applied: Aquacel Ag 5. Secondary Dressing Applied Bordered Foam Dressing Dry Gauze 6. Footwear/Offloading device applied Other footwear/offloading device applied (specify in notes) Electronic Signature(s) Signed: 04/16/2016 5:26:45 PM By: Regan Lemming BSN, RN Entered By: Regan Lemming on 04/16/2016 15:40:01 [name] ([id number]) -------------------------------------------------------------------------------- [place] Details Patient Name: [name]. Date of Service: 04/16/2016 3:15 PM Medical Record Patient Account Number: [id number] Number: Treating RN: Baruch Gouty, RN, BSN, Rita [birth year] [phone] y.o. Other Clinician: Date of Birth/Sex: Male) Treating ROBSON, [place] Primary Care Physician: Buddy Duty, JEFFREY Physician/Extender: G Referring Physician: Buddy Duty, JEFFREY Weeks in Treatment: 5 Vital Signs Time Taken: 15:34 Temperature (F): 98.1 Height (in): 70 Pulse (bpm): 92 Weight (lbs): 260 Respiratory Rate (breaths/min): 17 Body Mass Index (BMI): 37.3 Blood Pressure (mmHg): 163/85 Reference Range: 80 - 120 mg / dl Electronic Signature(s) Signed: 04/16/2016 5:26:45 PM By: Regan Lemming BSN, RN Entered By: Regan Lemming on 04/16/2016 15:34:49

## 2016-04-19 LAB — CULTURE, BLOOD (ROUTINE X 2)
Culture: NO GROWTH
Culture: NO GROWTH

## 2016-04-19 LAB — AEROBIC/ANAEROBIC CULTURE W GRAM STAIN (SURGICAL/DEEP WOUND): Special Requests: NORMAL

## 2016-04-19 LAB — AEROBIC/ANAEROBIC CULTURE (SURGICAL/DEEP WOUND)

## 2016-04-22 LAB — AEROBIC CULTURE W GRAM STAIN (SUPERFICIAL SPECIMEN)

## 2016-04-22 LAB — AEROBIC CULTURE  (SUPERFICIAL SPECIMEN)

## 2016-04-23 ENCOUNTER — Encounter: Payer: Managed Care, Other (non HMO) | Admitting: Internal Medicine

## 2016-04-23 ENCOUNTER — Encounter: Payer: Self-pay | Admitting: Internal Medicine

## 2016-04-23 ENCOUNTER — Ambulatory Visit (INDEPENDENT_AMBULATORY_CARE_PROVIDER_SITE_OTHER): Payer: Managed Care, Other (non HMO) | Admitting: Internal Medicine

## 2016-04-23 DIAGNOSIS — E11628 Type 2 diabetes mellitus with other skin complications: Secondary | ICD-10-CM

## 2016-04-23 DIAGNOSIS — E1169 Type 2 diabetes mellitus with other specified complication: Secondary | ICD-10-CM

## 2016-04-23 DIAGNOSIS — E11621 Type 2 diabetes mellitus with foot ulcer: Secondary | ICD-10-CM | POA: Diagnosis not present

## 2016-04-23 DIAGNOSIS — L089 Local infection of the skin and subcutaneous tissue, unspecified: Secondary | ICD-10-CM | POA: Diagnosis not present

## 2016-04-23 NOTE — Progress Notes (Signed)
[facility] for Infectious Disease  Patient Active Problem List   Diagnosis Date Noted  . Diabetic infection of left foot ([place]) 01/12/2016    Priority: High  . Surgery, elective   . Hardware complicating wound infection ([place])   . Diabetes mellitus due to underlying condition, uncontrolled, with diabetic neuropathy ([place])   . Metatarsal stress fracture of left foot 01/12/2016  . Diabetes mellitus due to underlying condition with diabetic polyneuropathy ([place]) 01/12/2016  . Diabetic ulcer of foot associated with type 2 diabetes mellitus, with necrosis of muscle ([place]) 01/11/2016    Patient's Medications  New Prescriptions   No medications on file  Previous Medications   ASPIRIN EC 81 MG TABLET    Take 81 mg by mouth daily.   DOCUSATE SODIUM (COLACE) 100 MG CAPSULE    Take 1 capsule (100 mg total) by mouth 2 (two) times daily. While taking narcotic pain medicine.   INSULIN ASPART (NOVOLOG) 100 UNIT/ML INJECTION    Inject 10-17 Units into the skin 3 (three) times daily before meals.    INSULIN DEGLUDEC (TRESIBA FLEXTOUCH) 100 UNIT/ML SOPN    Inject 35 Units into the skin at bedtime.   LOSARTAN-HYDROCHLOROTHIAZIDE (HYZAAR) 100-25 MG TABLET    Take 1 tablet by mouth daily.   METFORMIN (GLUCOPHAGE) 1000 MG TABLET    Take 1,000 mg by mouth 2 (two) times daily.   MULTIVITAMIN-IRON-MINERALS-FOLIC ACID (CENTRUM) CHEWABLE TABLET    Chew 1 tablet by mouth daily.   OXYCODONE (ROXICODONE) 5 MG IMMEDIATE RELEASE TABLET    Take 1-2 tablets (5-10 mg total) by mouth every 4 (four) hours as needed for moderate pain or severe pain.   OXYCODONE-ACETAMINOPHEN (PERCOCET) 7.5-325 MG TABLET    Take 1 tablet by mouth every 4 (four) hours as needed for severe pain.   SENNA (SENOKOT) 8.6 MG TABS TABLET    Take 2 tablets (17.2 mg total) by mouth 2 (two) times daily.   SIMVASTATIN (ZOCOR) 20 MG TABLET    Take 20 mg by mouth daily.   TAMSULOSIN (FLOMAX) 0.4 MG CAPS CAPSULE    Take 0.4 mg by mouth daily.    Modified Medications   No medications on file  Discontinued Medications   CLINDAMYCIN (CLEOCIN) 300 MG CAPSULE    Take 1 capsule (300 mg total) by mouth 3 (three) times daily.    Subjective: [name] is in for his routine follow-up visit. He completed 6 weeks of antibiotic therapy for his left diabetic foot infection on 02/21/2016. His wound on the left lateral foot had healed and he was released from the wound center in late September. About 3 weeks ago he went out of town and did a lot of walking and opened up a sore in the same place on his left lateral foot. He recently started to drain malodorous, bloody fluid. He went to the emergency department on 04/14/2016. A swab culture grew group A strep. He was given one dose of vancomycin and started on oral clindamycin. He was seen back in the wound center by Dr. Dellia Nims 2 days later. Repeat swab cultures of drainage grew enterococcus and MRSA. He has not had any fever, chills or sweats.  Review of Systems: Review of Systems  Constitutional: Negative for chills, diaphoresis and fever.  Musculoskeletal: Positive for joint pain.    Past Medical History:  Diagnosis Date  . Arthritis   . BPH (benign prostatic hypertrophy)   . Diabetes mellitus   . Diabetic  neuropathy ([place])   . Hypertension   . Metatarsal bone fracture    left 5th toe  . Osteomyelitis of toe of right foot ([place])   . Post-operative infection    and diabetic ulcer left foot  . Sleep apnea     does not wear CPAP  . Wears glasses     Social History  Substance Use Topics  . Smoking status: Never Smoker  . Smokeless tobacco: Never Used  . Alcohol use Yes     Comment: occasional     Family History  Problem Relation Age of Onset  . Diabetes Mother   . Diabetes Other     Allergies  Allergen Reactions  . No Known Allergies     Objective: Vitals:   04/23/16 0930  BP: (!) 163/85  Pulse: 93  Temp: 97.9 F (36.6 C)  TempSrc: Oral  Weight: 263 lb 12 oz (119.6  kg)   Body mass index is 37.84 kg/m.  Physical Exam  Constitutional: He is oriented to person, place, and time.  He is upset about the recurrence of infection.  Musculoskeletal:  He has a quarter-sized superficial ulcer on the left lateral foot with sero-sanguinous drainage. There is no fluctuance or surrounding cellulitis.  Neurological: He is alert and oriented to person, place, and time.  Psychiatric: Mood and affect normal.    Lab Results Sed Rate (mm/hr)  Date Value  04/14/2016 45 (H)  03/12/2016 27 (H)  02/13/2016 17   Erythrocyte Sed Rate (mm/hr)  Date Value  10/30/2012 46 (H)  10/26/2012 19   CRP  Date Value  03/12/2016 21.5 mg/L (H)  02/13/2016 <0.5 mg/dL     Problem List Items Addressed This Visit      High   Diabetic infection of left foot [facility])    [name] has recurrent left diabetic foot infection. The 3 different organisms grown from swab cultures of drainage may not reflect what is actually causing his infection. I will have him stop clindamycin now and obtain an MRI of his foot to look for evidence of deep abscess and osteomyelitis. If there is evidence of deeper infection it would be best to sample deep tissue for culture to help guide appropriate antibiotic therapy. He is scheduled to see his orthopedic surgeon, Dr. Wylene Simmer, tomorrow. He will follow-up here in 2 weeks.       Other Visit Diagnoses   None.      Michel Bickers, MD [facility] [place] [facility] [phone] pager   [phone] cell 04/23/2016, 10:15 AM

## 2016-04-23 NOTE — Addendum Note (Signed)
Addended by: Lorne Skeens D on: 04/23/2016 12:22 PM   Modules accepted: Orders

## 2016-04-23 NOTE — Assessment & Plan Note (Signed)
[name] has recurrent left diabetic foot infection. The 3 different organisms grown from swab cultures of drainage may not reflect what is actually causing his infection. I will have him stop clindamycin now and obtain an MRI of his foot to look for evidence of deep abscess and osteomyelitis. If there is evidence of deeper infection it would be best to sample deep tissue for culture to help guide appropriate antibiotic therapy. He is scheduled to see his orthopedic surgeon, Dr. Wylene Simmer, tomorrow. He will follow-up here in 2 weeks.

## 2016-04-24 NOTE — Progress Notes (Signed)
RAGHAV, COCKERILL ([id number]) Visit Report for 04/23/2016 Chief Complaint Document Details Patient Name: [name], [name]. Date of Service: 04/23/2016 2:15 PM Medical Record Patient Account Number: [id number] Number: Treating RN: Baruch Gouty, RN, BSN, Rita Dec 05, 1960 (55 y.o. Other Clinician: Date of Birth/Sex: Male) Treating Jolanda Mccann Primary Care Physician/Extender: Rose Fillers, JEFFREY Physician: Referring Physician: Buddy Duty, JEFFREY Weeks in Treatment: 6 Information Obtained from: Patient Chief Complaint Chronic left calf traumatic ulcer (healed). Left third toe ulcer (healed). New right calf ulcer. 10/25/15; patient returns today with a wound over his left lateral foot 03/06/16; patient returns today after further surgery on the left foot as well as a 6 week course of IV antibiotics Electronic Signature(s) Signed: 04/23/2016 5:12:34 PM By: [name] MD Entered By: [name] on 04/23/2016 14:47:52 [name], [name] ([id number]) -------------------------------------------------------------------------------- Debridement Details Patient Name: [name]. Date of Service: 04/23/2016 2:15 PM Medical Record Patient Account Number: [id number] Number: Treating RN: Baruch Gouty, RN, BSN, Rita 07-22-1960 (55 y.o. Other Clinician: Date of Birth/Sex: Male) Treating Sabriah Hobbins Primary Care Physician/Extender: Rose Fillers, JEFFREY Physician: Referring Physician: Buddy Duty, JEFFREY Weeks in Treatment: 6 Debridement Performed for Wound #6 Left,Lateral Foot Assessment: Performed By: Physician Lawanda Cousins, NP Debridement: Debridement Pre-procedure Yes - 14:36 Verification/Time Out Taken: Start Time: 14:36 Pain Control: Lidocaine 4% Topical Solution Level: Skin/Subcutaneous Tissue Total Area Debrided (L x 2 (cm) x 2 (cm) = 4 (cm) W): Tissue and other Non-Viable, Callus, Fibrin/Slough, Subcutaneous material debrided: Instrument: Blade, Forceps Bleeding:  Moderate Hemostasis Achieved: Pressure End Time: 14:41 Procedural Pain: 0 Post Procedural Pain: 0 Response to Treatment: Procedure was tolerated well Post Debridement Measurements of Total Wound Length: (cm) 2 Width: (cm) 2 Depth: (cm) 0.2 Volume: (cm) 0.628 Character of Wound/Ulcer Post Stable Debridement: Severity of Tissue Post Debridement: Fat layer exposed Post Procedure Diagnosis Same as Pre-procedure Electronic Signature(s) Signed: 04/23/2016 5:12:34 PM By: [name] MD [name] ([id number]) Signed: 04/23/2016 5:33:24 PM By: Regan Lemming BSN, RN Entered By: [name] on 04/23/2016 14:47:28 [name] ([id number]) -------------------------------------------------------------------------------- HPI Details Patient Name: [name], [name]. Date of Service: 04/23/2016 2:15 PM Medical Record Patient Account Number: [id number] Number: Treating RN: Baruch Gouty, RN, BSN, Rita September 07, 1960 (55 y.o. Other Clinician: Date of Birth/Sex: Male) Treating Darnice Comrie Primary Care Physician/Extender: Rose Fillers, JEFFREY Physician: Referring Physician: Buddy Duty, JEFFREY Weeks in Treatment: 6 History of Present Illness HPI Description: Pleasant 55 year old with history of diabetes (Hgb A1c 10.8 in 2014) and peripheral neuropathy. No PVD. L ABI 1.1. Status post right great toe partial amputation years ago. He was at work and on 10/22/2014, was injured by a cart, and suffered an ulceration to his left anterior calf. He says that it subsequently became infected, and he was treated with a course of antibiotics. He was found on initial exam to have an ulceration on the dorsum of his left third toe. He was unaware of this and attributes it to pressure from his steel toed boots. More recently he injured his right anterior calf on a cart. Ambulating normally per his baseline. He has been undergoing regular debridements, applying mupirocin cream, and an Ace wrap for  edema control. He returns to clinic for follow-up and is without complaints. No pain. No fever or chills. No drainage. 10/25/15; this is a 56 year old man who has type II diabetes with diabetic polyneuropathy. He tells me that he fractured his left fifth metatarsal in June 2016 when he presented with swelling. He does not recall a specific injury. His  hemoglobin A1c was apparently too high at the time for consideration of surgery and he was put in some form of offloading. Ultimately he went to surgery in December with an allograft from his calcaneus to this site, plate and screws. He had an x-ray of the foot in March that showed concerns about nonunion. He tells me that in March he had to move and basically moved himself. He was on his foot a lot and then noticed some drainage from an open area. He has been following with his orthopedic surgeon Dr. Doran Durand. He has been applying a felt donut, dry dressing and using his heel healing sandal. 11/01/15; this is a patient I saw last week for the first time. He had a small open wound on the plantar aspect of his left foot at roughly the level of the base of his fifth metatarsal. He had a considerable degree of thickened skin around this wound on the plantar aspect which I thought was from chronic pressure on this area. He tells [place] that he had drainage over the course of the week. No systemic symptoms. 11/08/15; culture last week grew Citrobacter korseri. This should've been sensitive to the Augmentin I gave him. He has seen Dr. Doran Durand who did his initial surgery and according the patient the plan is to give this another month and then the hardware might need to come out of this. This seems like a reasonable plan. I will adjust his antibiotics to ciprofloxacin which probably should continue for at least another 2 weeks. I gave him 10 days worth today 11/22/15 the patient has completed antibiotics. He has an appointment with Dr. Doran Durand this Friday. There  is improved dimensions around the wound on the left fifth metatarsal base 11/29/15; the patient has completed antibiotics last week. Apparently his appointment with Dr. Doran Durand it is not until this Friday. Dimensions are roughly the same. 12/06/15; saw Dr. Doran Durand. No x-ray told the end of the month, next appointment June 30. We have been using Aquacel Ag 12/13/15: No major change this week. Using Aquacel AG [name], [name]. ([id number]) 621/17; arrived this week with maceration around the wound. There was quite a bit of undermining which required surgical debridement. I changed him to [facility] last week, by the patient's admission he was up on this more this week 12/27/15; macerated tissue around the wound is removed with a scalpel and pickups. There is no undermining. Nonviable subcutaneous tissue and skin taken from the superior circumference of the wound is slough from the surface. READMISSION 03/06/16 since I last saw this patient at the end of June, he went for surgery on 01/11/16 by Dr. Doran Durand of orthopedics. He had a left foot irrigation and debridement, removal of hardware and placement of wound VAC. He is also been followed by Dr. Megan Salon of infectious disease and completed a six-week course of IV Rocephin for group B strep and Enterobacter in the bone at the time of surgery. Apparently at the time of surgery the bone looked healthy so I don't think any bone was actually removed. He has been using silver alginate based dressings on the same wound area at the base of the left fifth metatarsal on the left. I note that he is also had arterial studies on 01/08/16, these showed a left ABI of 1.2 to and a right ABI of 1.3. Waveforms were listed as biphasic. He was not felt to have any specific arterial issues. 03/13/16; no real change in the condition of the wound at  the left lateral foot at roughly the base of his fifth metatarsal. Use silver alginate last week. 03/18/16 arrives today with no  open area. Being suspicious of the overlying callus I. Some of this back although I see nothing but covering tissue here/epithelium. There is no surrounding tenderness READMISSION 04/16/16 this is a patient I discharged about a month ago. Initially a surgical wound on the lateral aspect of his left foot which subsequently became infected. The story he is giving today that he went back into his own modified shoe started to notice pain 2 weeks ago he was seen in Dr. Nona Dell office by a physician assistant last Wednesday and according the patient was told that everything looked fine however this is clearly now broken open and he has an open wound in the same spot that we have been dealing with repetitively. Situation is complicated by the fact that he is running short of money on long-term disability. He has not taken his insulin and at least 2 weeks was previously on NovoLog short acting insulin on a sliding scale and TRESIBA 35 units at bedtime. He is no longer able to afford any of his medication he was in the x-ray on 10/15. A plain x-ray showed healed fracture of the left fifth metatarsal bone status post removal of the associated plate and screw fixation hardware. There was no acute appearing osseous abnormality. His blood work showed a white count of 9.2 with an essentially normal differential comprehensive metabolic panel was normal. Previous CT scan of the foot on 01/08/16 showed no osteomyelitis previous vascular workup showed no evidence of significant PAD on 01/13/16 04/23/16; culture I did last week grew enterococcus [ampicillin sensitive] and MRSA. He saw infectious disease yesterday. They stop the clindamycin and ordered an MRI. This is not unreasonable. All the hardware is out of the foot at this point. Electronic Signature(s) Signed: 04/23/2016 5:12:34 PM By: [name] MD Entered By: [name] on 04/23/2016 16:47:26 [name]  ([id number]) -------------------------------------------------------------------------------- Physical Exam Details Patient Name: [name], [name]. Date of Service: 04/23/2016 2:15 PM Medical Record Patient Account Number: [id number] Number: Treating RN: Baruch Gouty, RN, BSN, Rita August 04, 1960 (55 y.o. Other Clinician: Date of Birth/Sex: Male) Treating Glenden Rossell Primary Care Physician/Extender: Rose Fillers, [place] Physician: Referring Physician: Buddy Duty, JEFFREY Weeks in Treatment: 6 Constitutional Sitting or standing Blood Pressure is within target range for patient.. Pulse regular and within target range for patient.Marland Kitchen Respirations regular, non-labored and within target range.. Temperature is normal and within the target range for the patient.. Patient's appearance is neat and clean. Appears in no acute distress. Well nourished and well developed.. Notes Wound exam; this is a small linear area on the lateral aspect of his left foot. Thick callus, macerated tissue removed from around the circumference of the wound with pickups and a scalpel the. The wound is small but has some depth. The macerated tissue extends onto the plantar surface of his foot Electronic Signature(s) Signed: 04/23/2016 5:12:34 PM By: [name] MD Entered By: [name] on 04/23/2016 16:51:29 [name] ([id number]) -------------------------------------------------------------------------------- Physician Orders Details Patient Name: [name]. Date of Service: 04/23/2016 2:15 PM Medical Record Patient Account Number: [id number] Number: Treating RN: Baruch Gouty, RN, BSN, Rita 09-Jul-1960 (55 y.o. Other Clinician: Date of Birth/Sex: Male) Treating Aroush Chasse Primary Care Physician/Extender: Rose Fillers, JEFFREY Physician: Referring Physician: Mackey Birchwood in Treatment: 6 Verbal / Phone Orders: Yes Clinician: Afful, RN, BSN, Rita Read Back and Verified: Yes Diagnosis  Coding  Blood Glucose Testing Wound #6 Left,Lateral Foot o Finger stick in clinic as a component of the assessment of chronic ulcer Wound Cleansing Wound #6 Left,Lateral Foot o Cleanse wound with mild soap and water o May Shower, gently pat wound dry prior to applying new dressing. o May shower with protection. Anesthetic Wound #6 Left,Lateral Foot o Topical Lidocaine 4% cream applied to wound bed prior to debridement Primary Wound Dressing Wound #6 Left,Lateral Foot o Sorbalgon Ag Secondary Dressing Wound #6 Left,Lateral Foot o Gauze and Kerlix/Conform Dressing Change Frequency Wound #6 Left,Lateral Foot o Change dressing every day. Follow-up Appointments Wound #6 Left,Lateral Foot o Return Appointment in 1 week. Off-Loading Wound #6 Left,Lateral Foot [name], [name]. ([id number]) o Open toe surgical shoe with peg assist. Additional Orders / Instructions Wound #6 Left,Lateral Foot o Increase protein intake. o Activity as tolerated Medications-please add to medication list. Wound #6 Left,Lateral Foot o P.O. Antibiotics - AMOXIL 500MG  PO TID FOR 7 DAYS o P.O. Antibiotics - SEPTRA DS 1 BY MOUTH TWICE A DAY FOR 7 DAYS Electronic Signature(s) Signed: 04/23/2016 5:12:34 PM By: [name] MD Signed: 04/23/2016 5:33:24 PM By: Regan Lemming BSN, RN Entered By: Regan Lemming on 04/23/2016 14:46:10 [name], [name] ([id number]) -------------------------------------------------------------------------------- Prescription 04/23/2016 Patient Name: [name]. Physician: Ricard Dillon MD Date of Birth: 02/10/1961 NPI#: [id number] Sex: [name] DEA#: [id number] Phone #: [phone] License #: [id number] Patient Address: [address] [place], [place] [address] [address] [place], [place] [address] [phone] Allergies No Known  Allergies Physician's Orders P.O. Antibiotics - AMOXIL 500MG  PO TID FOR 7 DAYS Signature(s): Date(s): Electronic Signature(s) Signed: 04/23/2016 5:12:34 PM By: [name] MD Entered By: [name] on 04/23/2016 16:58:12 [name] ([id number]) --------------------------------------------------------------------------------  Problem List Details Patient Name: GABRIELLA, VANDENBOSCH. Date of Service: 04/23/2016 2:15 PM Medical Record Patient Account Number: [id number] Number: Treating RN: Baruch Gouty, RN, BSN, Rita Oct 11, 1960 (55 y.o. Other Clinician: Date of Birth/Sex: Male) Treating Daquann Merriott Primary Care Physician/Extender: Rose Fillers, JEFFREY Physician: Referring Physician: Buddy Duty, JEFFREY Weeks in Treatment: 6 Active Problems ICD-10 Encounter Code Description Active Date Diagnosis E11.621 Type 2 diabetes mellitus with foot ulcer 03/06/2016 Yes L97.523 Non-pressure chronic ulcer of other part of left foot with 03/06/2016 Yes necrosis of muscle M86.672 Other chronic osteomyelitis, left ankle and foot 03/06/2016 Yes Inactive Problems Resolved Problems Electronic Signature(s) Signed: 04/23/2016 5:12:34 PM By: [name] MD Entered By: [name] on 04/23/2016 14:47:06 [name] ([id number]) -------------------------------------------------------------------------------- Progress Note Details Patient Name: [name]. Date of Service: 04/23/2016 2:15 PM Medical Record Patient Account Number: [id number] Number: Treating RN: Baruch Gouty, RN, BSN, Rita [birth year] (55 y.o. Other Clinician: Date of Birth/Sex: Male) Treating Zya Finkle Primary Care Physician/Extender: Rose Fillers, JEFFREY Physician: Referring Physician: Buddy Duty, JEFFREY Weeks in Treatment: 6 Subjective Chief Complaint Information obtained from Patient Chronic left calf traumatic ulcer (healed). Left third toe ulcer (healed). New right calf ulcer. 10/25/15; patient returns  today with a wound over his left lateral foot 03/06/16; patient returns today after further surgery on the left foot as well as a 6 week course of IV antibiotics History of Present Illness (HPI) Pleasant 55 year old with history of diabetes (Hgb A1c 10.8 in 2014) and peripheral neuropathy. No PVD. L ABI 1.1. Status post right great toe partial amputation years ago. He was at work and on 10/22/2014, was injured by a cart, and suffered an ulceration to his  left anterior calf. He says that it subsequently became infected, and he was treated with a course of antibiotics. He was found on initial exam to have an ulceration on the dorsum of his left third toe. He was unaware of this and attributes it to pressure from his steel toed boots. More recently he injured his right anterior calf on a cart. Ambulating normally per his baseline. He has been undergoing regular debridements, applying mupirocin cream, and an Ace wrap for edema control. He returns to clinic for follow-up and is without complaints. No pain. No fever or chills. No drainage. 10/25/15; this is a 55 year old man who has type II diabetes with diabetic polyneuropathy. He tells me that he fractured his left fifth metatarsal in June 2016 when he presented with swelling. He does not recall a specific injury. His hemoglobin A1c was apparently too high at the time for consideration of surgery and he was put in some form of offloading. Ultimately he went to surgery in December with an allograft from his calcaneus to this site, plate and screws. He had an x-ray of the foot in March that showed concerns about nonunion. He tells me that in March he had to move and basically moved himself. He was on his foot a lot and then noticed some drainage from an open area. He has been following with his orthopedic surgeon Dr. Doran Durand. He has been applying a felt donut, dry dressing and using his heel healing sandal. 11/01/15; this is a patient I saw last week for  the first time. He had a small open wound on the plantar aspect of his left foot at roughly the level of the base of his fifth metatarsal. He had a considerable degree of thickened skin around this wound on the plantar aspect which I thought was from chronic pressure on this area. He tells [place] that he had drainage over the course of the week. No systemic symptoms. 11/08/15; culture last week grew Citrobacter korseri. This should've been sensitive to the Augmentin I gave him. He has seen Dr. Doran Durand who did his initial surgery and according the patient the plan is to give this another month and then the hardware might need to come out of this. This seems like a reasonable plan. I will adjust his antibiotics to ciprofloxacin which probably should continue for at least another 2 weeks. I gave [name], [name] ([id number]) him 10 days worth today 11/22/15 the patient has completed antibiotics. He has an appointment with Dr. Doran Durand this Friday. There is improved dimensions around the wound on the left fifth metatarsal base 11/29/15; the patient has completed antibiotics last week. Apparently his appointment with Dr. Doran Durand it is not until this Friday. Dimensions are roughly the same. 12/06/15; saw Dr. Doran Durand. No x-ray told the end of the month, next appointment June 30. We have been using Aquacel Ag 12/13/15: No major change this week. Using Aquacel AG 621/17; arrived this week with maceration around the wound. There was quite a bit of undermining which required surgical debridement. I changed him to [facility] last week, by the patient's admission he was up on this more this week 12/27/15; macerated tissue around the wound is removed with a scalpel and pickups. There is no undermining. Nonviable subcutaneous tissue and skin taken from the superior circumference of the wound is slough from the surface. READMISSION 03/06/16 since I last saw this patient at the end of June, he went for surgery on 01/11/16 by  Dr. Doran Durand of orthopedics.  He had a left foot irrigation and debridement, removal of hardware and placement of wound VAC. He is also been followed by Dr. Megan Salon of infectious disease and completed a six-week course of IV Rocephin for group B strep and Enterobacter in the bone at the time of surgery. Apparently at the time of surgery the bone looked healthy so I don't think any bone was actually removed. He has been using silver alginate based dressings on the same wound area at the base of the left fifth metatarsal on the left. I note that he is also had arterial studies on 01/08/16, these showed a left ABI of 1.2 to and a right ABI of 1.3. Waveforms were listed as biphasic. He was not felt to have any specific arterial issues. 03/13/16; no real change in the condition of the wound at the left lateral foot at roughly the base of his fifth metatarsal. Use silver alginate last week. 03/18/16 arrives today with no open area. Being suspicious of the overlying callus I. Some of this back although I see nothing but covering tissue here/epithelium. There is no surrounding tenderness READMISSION 04/16/16 this is a patient I discharged about a month ago. Initially a surgical wound on the lateral aspect of his left foot which subsequently became infected. The story he is giving today that he went back into his own modified shoe started to notice pain 2 weeks ago he was seen in Dr. Nona Dell office by a physician assistant last Wednesday and according the patient was told that everything looked fine however this is clearly now broken open and he has an open wound in the same spot that we have been dealing with repetitively. Situation is complicated by the fact that he is running short of money on long-term disability. He has not taken his insulin and at least 2 weeks was previously on NovoLog short acting insulin on a sliding scale and TRESIBA 35 units at bedtime. He is no longer able to afford any of his  medication he was in the x-ray on 10/15. A plain x-ray showed healed fracture of the left fifth metatarsal bone status post removal of the associated plate and screw fixation hardware. There was no acute appearing osseous abnormality. His blood work showed a white count of 9.2 with an essentially normal differential comprehensive metabolic panel was normal. Previous CT scan of the foot on 01/08/16 showed no osteomyelitis previous vascular workup showed no evidence of significant PAD on 01/13/16 04/23/16; culture I did last week grew enterococcus [ampicillin sensitive] and MRSA. He saw infectious disease yesterday. They stop the clindamycin and ordered an MRI. This is not unreasonable. All the hardware is out of the foot at this point. [name], [name] ([id number]) Objective Constitutional Sitting or standing Blood Pressure is within target range for patient.. Pulse regular and within target range for patient.Marland Kitchen Respirations regular, non-labored and within target range.. Temperature is normal and within the target range for the patient.. Patient's appearance is neat and clean. Appears in no acute distress. Well nourished and well developed.. Vitals Time Taken: 2:20 PM, Height: 70 in, Weight: 260 lbs, BMI: 37.3, Temperature: 97.9 F, Pulse: 96 bpm, Respiratory Rate: 17 breaths/min, Blood Pressure: 156/77 mmHg. General Notes: Wound exam; this is a small linear area on the lateral aspect of his left foot. Thick callus, macerated tissue removed from around the circumference of the wound with pickups and a scalpel the. The wound is small but has some depth. The macerated tissue extends onto the plantar surface  of his foot Integumentary (Hair, Skin) Wound #6 status is Open. Original cause of wound was Gradually Appeared. The wound is located on the Left,Lateral Foot. The wound measures 2cm length x 2cm width x 0.3cm depth; 3.142cm^2 area and 0.942cm^3 volume. The wound is limited to skin breakdown.  There is no tunneling or undermining noted. There is a large amount of serosanguineous drainage noted. The wound margin is thickened. There is small (1-33%) pink, pale granulation within the wound bed. There is a large (67-100%) amount of necrotic tissue within the wound bed including Adherent Slough. The periwound skin appearance exhibited: Maceration, Moist. The periwound skin appearance did not exhibit: Callus, Crepitus, Excoriation, Fluctuance, Friable, Induration, Localized Edema, Rash, Scarring, Dry/Scaly, Atrophie Blanche, Cyanosis, Ecchymosis, Hemosiderin Staining, Mottled, Pallor, Rubor, Erythema. Periwound temperature was noted as No Abnormality. The periwound has tenderness on palpation. 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 ([id number]) Procedures Wound #6 Wound #6 is a Diabetic Wound/Ulcer of the Lower Extremity located on the Left,Lateral Foot . There was a Skin/Subcutaneous Tissue Debridement [id number]) debridement with total area of 4 sq cm performed by Lawanda Cousins, NP. with the following instrument(s): Blade and Forceps to remove Non-Viable tissue/material including Fibrin/Slough, Callus, and Subcutaneous after achieving pain control using Lidocaine 4% Topical Solution. A time out was conducted at 14:36, prior to the start of the procedure. A Moderate amount of bleeding was controlled with Pressure. The procedure was tolerated well with a pain level of 0 throughout and a pain level of 0 following the procedure. Post Debridement Measurements: 2cm length x 2cm width x 0.2cm depth; 0.628cm^3 volume. Character of Wound/Ulcer Post Debridement is stable. Severity of Tissue Post Debridement is: Fat layer exposed. Post procedure Diagnosis Wound #6: Same as  Pre-Procedure Plan Blood Glucose Testing: Wound #6 Left,Lateral Foot: Finger stick in clinic as a component of the assessment of chronic ulcer Wound Cleansing: Wound #6 Left,Lateral Foot: Cleanse wound with mild soap and water May Shower, gently pat wound dry prior to applying new dressing. May shower with protection. Anesthetic: Wound #6 Left,Lateral Foot: Topical Lidocaine 4% cream applied to wound bed prior to debridement Primary Wound Dressing: Wound #6 Left,Lateral Foot: Sorbalgon Ag Secondary Dressing: Wound #6 Left,Lateral Foot: Gauze and Kerlix/Conform Dressing Change Frequency: Wound #6 Left,Lateral Foot: Change dressing every day. Follow-up Appointments: Wound #6 Left,Lateral Foot: Return Appointment in 1 week. Off-Loading: Wound #6 Left,Lateral Foot: Open toe surgical shoe with peg assist. Additional Orders / Instructions: [name], [name] ([id number]) Wound #6 Left,Lateral Foot: Increase protein intake. Activity as tolerated Medications-please add to medication list.: Wound #6 Left,Lateral Foot: P.O. Antibiotics - AMOXIL 500MG  PO TID FOR 7 DAYS P.O. Antibiotics - SEPTRA DS 1 BY MOUTH TWICE A DAY FOR 7 DAYS #1 in view of the culture from last week showing MRSA [a few] and moderate ampicillin sensitive VRE I have put him on ampicillin and Septra #2 he has an MRI ordered by infectious disease #3 he is not taking any of his insulin [doesn't have any money to purchase it] although he says his CBG this morning was 137. We will continue with the silver alginate based dressings to the wound. Electronic Signature(s) Signed: 04/23/2016 5:12:34 PM By: [name] MD Entered By: [name] on 04/23/2016 16:57:36 [name], [name] ([id number]) -------------------------------------------------------------------------------- SuperBill Details Patient Name: [id number],  Belal F. Date of Service: 04/23/2016 Medical Record Patient Account Number:  [id number] Number: Treating RN: Baruch Gouty, RN, BSN, Rita 08/18/1960 (55 y.o. Other Clinician: Date of Birth/Sex: Male) Treating Permelia Bamba, [place] Primary Care Physician: Buddy Duty, JEFFREY Physician/Extender: G Referring Physician: Buddy Duty, JEFFREY Weeks in Treatment: 6 Diagnosis Coding ICD-10 Codes Code Description E11.621 Type 2 diabetes mellitus with foot ulcer L97.523 Non-pressure chronic ulcer of other part of left foot with necrosis of muscle M86.672 Other chronic osteomyelitis, left ankle and foot Facility Procedures CPT4 Code Description: [id number] - DEB SUBQ TISSUE 20 SQ CM/< ICD-10 Description Diagnosis E11.621 Type 2 diabetes mellitus with foot ulcer L97.523 Non-pressure chronic ulcer of other part of left foo Modifier: t with necrosi Quantity: 1 s of muscle Physician Procedures CPT4 Code Description: [id number] - WC PHYS SUBQ TISS 20 SQ CM ICD-10 Description Diagnosis E11.621 Type 2 diabetes mellitus with foot ulcer L97.523 Non-pressure chronic ulcer of other part of left foo Modifier: t with necrosis Quantity: 1 of muscle Electronic Signature(s) Signed: 04/23/2016 5:12:34 PM By: [name] MD Entered By: [name] on 04/23/2016 16:58:04

## 2016-04-24 NOTE — Progress Notes (Signed)
TYRRELL, PASZTOR ([id number]) Visit Report for 04/23/2016 Arrival Information Details Patient Name: [name], [name]. Date of Service: 04/23/2016 2:15 PM Medical Record Patient Account Number: [id number] Number: Treating [name]: Baruch Gouty, [name], [name], Rita [birth year] (55 y.o. Other Clinician: Date of Birth/Sex: Male) Treating ROBSON, [place] Primary Care Physician: Buddy Duty, JEFFREY Physician/Extender: G Referring Physician: Buddy Duty, JEFFREY Weeks in Treatment: 6 Visit Information History Since Last Visit All ordered tests and consults were completed: No Patient Arrived: Ambulatory Added or deleted any medications: No Arrival Time: 14:17 Had a fall or experienced change in No Accompanied By: self activities of daily living that may affect Transfer Assistance: None risk of falls: Patient Identification Verified: Yes Signs or symptoms of abuse/neglect since last No Secondary Verification Process Yes visito Completed: Hospitalized since last visit: No Patient Requires Transmission-Based No Has Dressing in Place as Prescribed: Yes Precautions: Has Footwear/Offloading in Place as Yes Patient Has Alerts: No Prescribed: Left: Wedge Shoe Pain Present Now: No Electronic Signature(s) Signed: 04/23/2016 5:33:24 PM By: Regan Lemming BSN, [name] Entered By: Regan Lemming on 04/23/2016 14:18:01 [name] ([id number]) -------------------------------------------------------------------------------- Encounter Discharge Information Details Patient Name: [name]. Date of Service: 04/23/2016 2:15 PM Medical Record Patient Account Number: [id number] Number: Treating [name]: Baruch Gouty, [name], [name], Rita 08-Sep-1960 (55 y.o. Other Clinician: Date of Birth/Sex: Male) Treating ROBSON, MICHAEL Primary Care Physician: Buddy Duty, JEFFREY Physician/Extender: G Referring Physician: Buddy Duty, JEFFREY Weeks in Treatment: 6 Encounter Discharge Information Items Discharge Pain Level: 0 Discharge Condition:  Stable Ambulatory Status: Ambulatory Discharge Destination: Home Transportation: Private Auto Accompanied By: SELF Schedule Follow-up Appointment: No Medication Reconciliation completed and provided to Patient/Care No Slyvia Lartigue: Provided on Clinical Summary of Care: 04/23/2016 Form Type Recipient Paper Patient HP Electronic Signature(s) Signed: 04/23/2016 5:33:24 PM By: Regan Lemming BSN, [name] Previous Signature: 04/23/2016 2:57:22 PM Version By: Ruthine Dose Entered By: Regan Lemming on 04/23/2016 15:00:59 [name] ([id number]) -------------------------------------------------------------------------------- Lower Extremity Assessment Details Patient Name: [name]. Date of Service: 04/23/2016 2:15 PM Medical Record Patient Account Number: [id number] Number: Treating [name]: Baruch Gouty, [name], [name], Rita [birth year] (55 y.o. Other Clinician: Date of Birth/Sex: Male) Treating ROBSON, MICHAEL Primary Care Physician: Buddy Duty, JEFFREY Physician/Extender: G Referring Physician: Buddy Duty, JEFFREY Weeks in Treatment: 6 Vascular Assessment Pulses: Posterior Tibial Dorsalis Pedis Palpable: [Left:Yes] Extremity colors, hair growth, and conditions: Extremity Color: [Left:Mottled] Hair Growth on Extremity: [Left:No] Temperature of Extremity: [Left:Warm] Capillary Refill: [Left:< 3 seconds] Toe Nail Assessment Left: Right: Thick: Yes Discolored: Yes Deformed: No Improper Length and Hygiene: Yes Electronic Signature(s) Signed: 04/23/2016 5:33:24 PM By: Regan Lemming BSN, [name] Entered By: Regan Lemming on 04/23/2016 14:18:28 [name] ([id number]) -------------------------------------------------------------------------------- Multi Wound Chart Details Patient Name: [name]. Date of Service: 04/23/2016 2:15 PM Medical Record Patient Account Number: [id number] Number: Treating [name]: Baruch Gouty, [name], [name], Rita 1960-09-15 (55 y.o. Other Clinician: Date of  Birth/Sex: Male) Treating ROBSON, [place] Primary Care Physician: Buddy Duty, JEFFREY Physician/Extender: G Referring Physician: Buddy Duty, JEFFREY Weeks in Treatment: 6 Vital Signs Height(in): 70 Pulse(bpm): 96 Weight(lbs): 260 Blood Pressure 156/77 (mmHg): Body Mass Index(BMI): 37 Temperature(F): 97.9 Respiratory Rate 17 (breaths/min): Photos: [6:No Photos] [N/A:N/A] Wound Location: [6:Left Foot - Lateral] [N/A:N/A] Wounding Event: [6:Gradually Appeared] [N/A:N/A] Primary Etiology: [6:Diabetic Wound/Ulcer of N/A the Lower Extremity] Comorbid History: [6:Hypertension, Type II Diabetes, Neuropathy] [N/A:N/A] Date Acquired: [6:03/25/2016] [N/A:N/A] Weeks of Treatment: [6:1] [N/A:N/A] Wound Status: [6:Open] [N/A:N/A] Measurements L x W x D 2x2x0.3 [N/A:N/A] (cm) Area (cm) : [6:3.142] [N/A:N/A] Volume (cm) : [6:0.942] [N/A:N/A] % Reduction  in Area: [6:33.30%] [N/A:N/A] % Reduction in Volume: 0.00% [N/A:N/A] Classification: [6:Unable to visualize wound N/A bed] Exudate Amount: [6:Large] [N/A:N/A] Exudate Type: [6:Serosanguineous] [N/A:N/A] Exudate Color: [6:red, brown] [N/A:N/A] Foul Odor After [6:Yes] [N/A:N/A] Cleansing: Odor Anticipated Due to No [N/A:N/A] Product Use: Wound Margin: [6:Thickened] [N/A:N/A] Granulation Amount: [6:Small (1-33%)] [N/A:N/A] Granulation Quality: [6:Pink, Pale] [N/A:N/A] Necrotic Amount: Large (67-100%) N/A N/A Exposed Structures: Fascia: No N/A N/A Fat: No Tendon: No Muscle: No Joint: No Bone: No Limited to Skin Breakdown Epithelialization: None N/A N/A Periwound Skin Texture: Edema: No N/A N/A Excoriation: No Induration: No Callus: No Crepitus: No Fluctuance: No Friable: No Rash: No Scarring: No Periwound Skin Maceration: Yes N/A N/A Moisture: Moist: Yes Dry/Scaly: No Periwound Skin Color: Atrophie Blanche: No N/A N/A Cyanosis: No Ecchymosis: No Erythema: No Hemosiderin Staining: No Mottled: No Pallor: No Rubor:  No Temperature: No Abnormality N/A N/A Tenderness on Yes N/A N/A Palpation: Wound Preparation: Ulcer Cleansing: N/A N/A Rinsed/Irrigated with Saline Topical Anesthetic Applied: Other: Lidocaine 4% Treatment Notes Electronic Signature(s) Signed: 04/23/2016 5:33:24 PM By: Regan Lemming BSN, [name] Entered By: Regan Lemming on 04/23/2016 14:27:46 [name] ([id number]) -------------------------------------------------------------------------------- [place] Details Patient Name: [name], [name]. Date of Service: 04/23/2016 2:15 PM Medical Record Patient Account Number: [id number] Number: Treating [name]: Baruch Gouty, [name], [name], Rita Nov 01, 1960 (55 y.o. Other Clinician: Date of Birth/Sex: Male) Treating ROBSON, [place] Primary Care Physician: Buddy Duty, JEFFREY Physician/Extender: G Referring Physician: Buddy Duty, JEFFREY Weeks in Treatment: 6 Active Inactive Orientation to the Wound Care Program Nursing Diagnoses: Knowledge deficit related to the wound healing center program Goals: Patient/caregiver will verbalize understanding of the [facility] Date Initiated: 04/16/2016 Goal Status: Active Interventions: Provide education on orientation to the wound center Notes: Wound/Skin Impairment Nursing Diagnoses: Impaired tissue integrity Knowledge deficit related to ulceration/compromised skin integrity Goals: Patient/caregiver will verbalize understanding of skin care regimen Date Initiated: 04/16/2016 Goal Status: Active Ulcer/skin breakdown will have a volume reduction of 30% by week 4 Date Initiated: 04/16/2016 Goal Status: Active Ulcer/skin breakdown will have a volume reduction of 50% by week 8 Date Initiated: 04/16/2016 Goal Status: Active Ulcer/skin breakdown will have a volume reduction of 80% by week 12 Date Initiated: 04/16/2016 Goal Status: Active Ulcer/skin breakdown will heal within 14 weeks [name], [name] ([id number]) Date  Initiated: 04/16/2016 Goal Status: Active Interventions: Assess patient/caregiver ability to obtain necessary supplies Assess patient/caregiver ability to perform ulcer/skin care regimen upon admission and as needed Assess ulceration(s) every visit Provide education on ulcer and skin care Treatment Activities: Skin care regimen initiated : 03/06/2016 Topical wound management initiated : 03/06/2016 Notes: Electronic Signature(s) Signed: 04/23/2016 5:33:24 PM By: Regan Lemming BSN, [name] Entered By: Regan Lemming on 04/23/2016 14:27:33 [name] ([id number]) -------------------------------------------------------------------------------- Pain Assessment Details Patient Name: [name]. Date of Service: 04/23/2016 2:15 PM Medical Record Patient Account Number: [id number] Number: Treating [name]: Baruch Gouty, [name], [name], Rita 1961-02-28 (55 y.o. Other Clinician: Date of Birth/Sex: Male) Treating ROBSON, MICHAEL Primary Care Physician: Buddy Duty, JEFFREY Physician/Extender: G Referring Physician: Buddy Duty, JEFFREY Weeks in Treatment: 6 Active Problems Location of Pain Severity and Description of Pain Patient Has Paino No Site Locations With Dressing Change: No Pain Management and Medication Current Pain Management: Electronic Signature(s) Signed: 04/23/2016 5:33:24 PM By: Regan Lemming BSN, [name] Entered By: Regan Lemming on 04/23/2016 14:18:08 [name] ([id number]) -------------------------------------------------------------------------------- Patient/Caregiver Education Details Patient Name: [name]. Date of Service: 04/23/2016 2:15 PM Medical Record Patient Account Number: [id number] Number: Treating [name]: [place],  [name], [name], [name] 08-20-1960 (55 y.o. Other Clinician: Date of Birth/Gender: Male) Treating ROBSON, MICHAEL Primary Care Physician: Buddy Duty, JEFFREY Physician/Extender: G Referring Physician: Mackey Birch[place] in Treatment: 6 Education  Assessment Education Provided To: Patient Education Topics Provided Welcome To [facility]: Methods: Explain/Verbal Wound/Skin Impairment: Methods: Explain/Verbal Responses: State content correctly Electronic Signature(s) Signed: 04/23/2016 5:33:24 PM By: Regan Lemming BSN, [name] Entered By: Regan Lemming on 04/23/2016 15:01:17 [name] ([id number]) -------------------------------------------------------------------------------- Wound Assessment Details Patient Name: [name]. Date of Service: 04/23/2016 2:15 PM Medical Record Patient Account Number: [id number] Number: Treating [name]: Baruch Gouty, [name], [name], Rita March 25, 1961 (55 y.o. Other Clinician: Date of Birth/Sex: Male) Treating ROBSON, [place] Primary Care Physician: Buddy Duty, JEFFREY Physician/Extender: G Referring Physician: Buddy Duty, JEFFREY Weeks in Treatment: 6 Wound Status Wound Number: 6 Primary Diabetic Wound/Ulcer of the Lower Etiology: Extremity Wound Location: Left Foot - Lateral Wound Status: Open Wounding Event: Gradually Appeared Comorbid Hypertension, Type II Diabetes, Date Acquired: 03/25/2016 History: Neuropathy Weeks Of Treatment: 1 Clustered Wound: No Photos Photo Uploaded By: Regan Lemming on 04/23/2016 16:10:54 Wound Measurements Length: (cm) 2 Width: (cm) 2 Depth: (cm) 0.3 Area: (cm) 3.142 Volume: (cm) 0.942 % Reduction in Area: 33.3% % Reduction in Volume: 0% Epithelialization: None Tunneling: No Undermining: No Wound Description Classification: Unable to visualize wound bed Foul Odor Wound Margin: Thickened Due to Pro Exudate Amount: Large Exudate Type: Serosanguineous Exudate Color: red, brown After Cleansing: Yes duct Use: No Wound Bed Granulation Amount: Small (1-33%) Exposed Structure Granulation Quality: Pink, Pale Fascia Exposed: No Necrotic Amount: Large (67-100%) Fat Layer Exposed: No Necrotic Quality: Adherent Slough Tendon Exposed: No Muscle Exposed:  No [name], [name] ([id number]) Joint Exposed: No Bone Exposed: No Limited to Skin Breakdown Periwound Skin Texture Texture Color No Abnormalities Noted: No No Abnormalities Noted: No Callus: No Atrophie Blanche: No Crepitus: No Cyanosis: No Excoriation: No Ecchymosis: No Fluctuance: No Erythema: No Friable: No Hemosiderin Staining: No Induration: No Mottled: No Localized Edema: No Pallor: No Rash: No Rubor: No Scarring: No Temperature / Pain Moisture Temperature: No Abnormality No Abnormalities Noted: No Tenderness on Palpation: Yes Dry / Scaly: No Maceration: Yes Moist: Yes Wound Preparation Ulcer Cleansing: Rinsed/Irrigated with Saline Topical Anesthetic Applied: Other: Lidocaine 4%, Treatment Notes Wound #6 (Left, Lateral Foot) 1. Cleansed with: Clean wound with Normal Saline 4. Dressing Applied: Aquacel Ag 5. Secondary Dressing Applied Gauze and Kerlix/Conform 7. Secured with Recruitment consultant) Signed: 04/23/2016 5:33:24 PM By: Regan Lemming BSN, [name] Entered By: Regan Lemming on 04/23/2016 14:26:25 [name] ([id number]) -------------------------------------------------------------------------------- Vitals Details Patient Name: [name]. Date of Service: 04/23/2016 2:15 PM Medical Record Patient Account Number: [id number] Number: Treating [name]: Baruch Gouty, [name], [name], Rita 22-Aug-1960 (55 y.o. Other Clinician: Date of Birth/Sex: Male) Treating ROBSON, MICHAEL Primary Care Physician: Buddy Duty, JEFFREY Physician/Extender: G Referring Physician: Buddy Duty, JEFFREY Weeks in Treatment: 6 Vital Signs Time Taken: 14:20 Temperature (F): 97.9 Height (in): 70 Pulse (bpm): 96 Weight (lbs): 260 Respiratory Rate (breaths/min): 17 Body Mass Index (BMI): 37.3 Blood Pressure (mmHg): 156/77 Reference Range: 80 - 120 mg / dl Electronic Signature(s) Signed: 04/23/2016 5:33:24 PM By: Regan Lemming BSN, [name] Entered By: Regan Lemming on 04/23/2016  14:21:03

## 2016-04-30 ENCOUNTER — Ambulatory Visit (INDEPENDENT_AMBULATORY_CARE_PROVIDER_SITE_OTHER): Payer: Managed Care, Other (non HMO) | Admitting: Endocrinology

## 2016-04-30 ENCOUNTER — Encounter: Payer: Self-pay | Admitting: Endocrinology

## 2016-04-30 ENCOUNTER — Encounter: Payer: Managed Care, Other (non HMO) | Admitting: Physician Assistant

## 2016-04-30 DIAGNOSIS — E0865 Diabetes mellitus due to underlying condition with hyperglycemia: Secondary | ICD-10-CM

## 2016-04-30 DIAGNOSIS — E0840 Diabetes mellitus due to underlying condition with diabetic neuropathy, unspecified: Secondary | ICD-10-CM | POA: Diagnosis not present

## 2016-04-30 DIAGNOSIS — E11621 Type 2 diabetes mellitus with foot ulcer: Secondary | ICD-10-CM | POA: Diagnosis not present

## 2016-04-30 LAB — POCT GLYCOSYLATED HEMOGLOBIN (HGB A1C): Hemoglobin A1C: 8.1

## 2016-04-30 MED ORDER — BASAGLAR KWIKPEN 100 UNIT/ML ~~LOC~~ SOPN: 35.0000 [IU] | PEN_INJECTOR | Freq: Every day | SUBCUTANEOUS | 3 refills | Status: DC

## 2016-04-30 MED ORDER — INSULIN LISPRO 100 UNIT/ML (KWIKPEN): 15.0000 [IU] | PEN_INJECTOR | Freq: Three times a day (TID) | SUBCUTANEOUS | 11 refills | Status: DC

## 2016-04-30 NOTE — Patient Instructions (Addendum)
good diet and exercise significantly improve the control of your diabetes.  please let me know if you wish to be referred to a dietician.  high blood sugar is very risky to your health.  you should see an eye doctor and dentist every year.  It is very important to get all recommended vaccinations.  Controlling your blood pressure and cholesterol drastically reduces the damage diabetes does to your body.  Those who smoke should quit.  Please discuss these with your doctor.  check your blood sugar twice a day.  vary the time of day when you check, between before the 3 meals, and at bedtime.  also check if you have symptoms of your blood sugar being too high or too low.  please keep a record of the readings and bring it to your next appointment here (or you can bring the meter itself).  You can write it on any piece of paper.  please call us sooner if your blood sugar goes below 70, or if you have a lot of readings over 200.  I have sent prescriptions to your pharmacy, for insulins that your insurance prefers.  Please come back for a follow-up appointment in 1 month.

## 2016-04-30 NOTE — Progress Notes (Signed)
Subjective:    Patient ID: [name], male    DOB: [birth year], 55 y.o.   MRN: [id number]  HPI pt states DM was dx'ed in1997; he has moderate neuropathy of the lower extremities; he has associated nephropathy, retinopathy, and foot ulcer; he has been on insulin since 2014; pt says his diet is not good, and exercise is limited by foot ulcer; he has never had pancreatitis, severe hypoglycemia or DKA.  He takes multiple daily injections.  He stopped insulin 3 weeks ago, due to the cost.  He says when he was on insulin, cbg's were well-controlled.  Past Medical History:  Diagnosis Date  . Arthritis   . BPH (benign prostatic hypertrophy)   . Diabetes mellitus   . Diabetic neuropathy ([place])   . Hypertension   . Metatarsal bone fracture    left 5th toe  . Osteomyelitis of toe of right foot ([place])   . Post-operative infection    and diabetic ulcer left foot  . Sleep apnea     does not wear CPAP  . Wears glasses     Past Surgical History:  Procedure Laterality Date  . COLONOSCOPY W/ BIOPSIES AND POLYPECTOMY    . I&D EXTREMITY Left 01/11/2016   Procedure: LEFT FOOT IRRIGATION AND DEBRIDEMENT WOUND VAC AND REMOVAL OF HARDWARE;  Surgeon: Wylene Simmer, MD;  Location: [facility];  Service: Orthopedics;  Laterality: Left;  . ORIF TOE FRACTURE Left 06/08/2015   Procedure: OPEN REDUCTION INTERNAL FIXATION (ORIF) LEFT FIFTH METATARSAL BASE FRACTURE NONUNION; CALCANEAL AUTOGRAFT ;  Surgeon: Wylene Simmer, MD;  Location: [facility];  Service: Orthopedics;  Laterality: Left;  . PATELLA RECONSTRUCTION Left 2005  . PROSTATE ABLATION  2014  . TOE AMPUTATION     partial amputation right great toe    Social History   Social History  . Marital status: Married    Spouse name: N/A  . Number of children: N/A  . Years of education: N/A   Occupational History  . Not on file.   Social History Main Topics  . Smoking status: Never Smoker  . Smokeless tobacco: Never Used  . Alcohol use Yes     Comment: occasional   . Drug use: No  . Sexual activity: Not on file   Other Topics Concern  . Not on file   Social History Narrative  . No narrative on file    Current Outpatient Prescriptions on File Prior to Visit  Medication Sig Dispense Refill  . aspirin EC 81 MG tablet Take 81 mg by mouth daily.    Marland Kitchen docusate sodium (COLACE) 100 MG capsule Take 1 capsule (100 mg total) by mouth 2 (two) times daily. While taking narcotic pain medicine. 30 capsule 0  . losartan-hydrochlorothiazide (HYZAAR) 100-25 MG tablet Take 1 tablet by mouth daily.    . metFORMIN (GLUCOPHAGE) 1000 MG tablet Take 1,000 mg by mouth 2 (two) times daily.    . multivitamin-iron-minerals-folic acid (CENTRUM) chewable tablet Chew 1 tablet by mouth daily.    Marland Kitchen oxyCODONE (ROXICODONE) 5 MG immediate release tablet Take 1-2 tablets (5-10 mg total) by mouth every 4 (four) hours as needed for moderate pain or severe pain. 30 tablet 0  . senna (SENOKOT) 8.6 MG TABS tablet Take 2 tablets (17.2 mg total) by mouth 2 (two) times daily. 30 each 0  . tamsulosin (FLOMAX) 0.4 MG CAPS capsule Take 0.4 mg by mouth daily.      Current Facility-Administered Medications on File Prior to Visit  Medication Dose Route Frequency Provider Last Rate Last Dose  . Tdap (BOOSTRIX) injection 0.5 mL  0.5 mL Intramuscular Once Olen Cordial, NP        Allergies  Allergen Reactions  . No Known Allergies     Family History  Problem Relation Age of Onset  . Diabetes Mother   . Diabetes Other     BP 120/84   Pulse 99   Ht 5' 9.75" (1.772 m)   Wt 257 lb (116.6 kg)   SpO2 93%   BMI 37.14 kg/m   Review of Systems denies blurry vision, headache, chest pain, sob, n/v, urinary frequency, muscle cramps, excessive diaphoresis, memory loss, cold intolerance, rhinorrhea, and easy bruising.   He has lost weight.     Objective:   Physical Exam VS: see vs page GEN: no distress.  Morbid obesity. HEAD: head: no deformity eyes: no  periorbital swelling, no proptosis external nose and ears are normal mouth: no lesion seen NECK: supple, thyroid is not enlarged CHEST WALL: no deformity LUNGS: clear to auscultation CV: reg rate and rhythm, no murmur ABD: abdomen is soft, nontender.  no hepatosplenomegaly.  not distended.  no hernia MUSCULOSKELETAL: muscle bulk and strength are grossly normal.  no obvious joint swelling.  gait is normal and steady EXTEMITIES: no deformity, and normal color and temp on the right foot.  no edema.  Left foot is in bandage.  Distal right great toe absent. PULSES: right dorsalis pedis is intact.  no carotid bruit NEURO:  cn 2-12 grossly intact.   readily moves all 4's. sensation is intact to touch on the right foot, but decreased from normal.  SKIN:  Normal texture and temperature.  No rash or suspicious lesion is visible.   NODES:  None palpable at the neck PSYCH: alert, well-oriented.  Does not appear anxious nor depressed.  A1c=8.1%  I have reviewed outside records, and summarized: Pt was noted to have elevated a1c, and referred here.  He is also noted to have h/o drug abuse, but not recently    Assessment & Plan:  Insulin-requiring type 2 DM, with foot ulcer: he needs increased rx.   Noncompliance with insulin: pt says this is due to cost.     Patient is advised the following: Patient Instructions  good diet and exercise significantly improve the control of your diabetes.  please let me know if you wish to be referred to a dietician.  high blood sugar is very risky to your health.  you should see an eye doctor and dentist every year.  It is very important to get all recommended vaccinations.  Controlling your blood pressure and cholesterol drastically reduces the damage diabetes does to your body.  Those who smoke should quit.  Please discuss these with your doctor.  check your blood sugar twice a day.  vary the time of day when you check, between before the 3 meals, and at bedtime.   also check if you have symptoms of your blood sugar being too high or too low.  please keep a record of the readings and bring it to your next appointment here (or you can bring the meter itself).  You can write it on any piece of paper.  please call us sooner if your blood sugar goes below 70, or if you have a lot of readings over 200. I have sent prescriptions to your pharmacy, for insulins that your insurance prefers.  Please come back for a follow-up appointment in 1 month.

## 2016-05-01 NOTE — Progress Notes (Signed)
[name] ([id number]) Visit Report for 04/30/2016 Chief Complaint Document Details Patient Name: [name], [name] 04/30/2016 12:45 Date of Service: PM Medical Record [id number] Number: Patient Account Number: [id number] [birth year] (55 y.o. Treating RN: Baruch Gouty, RN, BSN, Velva Harman Date of Birth/Sex: Male) Other Clinician: Primary Care Physician: NNODI, ADAKU Treating STONE III, Ritha Sampedro Referring Physician: Buddy Duty, JEFFREY Physician/Extender: Weeks in Treatment: 7 Information Obtained from: Patient Chief Complaint 03/06/16; patient returns today after further surgery on the left foot as well as a 6 week course of IV antibiotics Electronic Signature(s) Signed: 05/01/2016 1:37:38 AM By: Worthy Keeler PA-C Entered By: Worthy Keeler on 04/30/2016 13:48:36 [name] ([id number]) -------------------------------------------------------------------------------- Debridement Details Patient Name: [name] 04/30/2016 12:45 Date of Service: PM Medical Record [id number] Number: Patient Account Number: [id number] [birth year] (55 y.o. Treating RN: Afful, RN, BSN, Velva Harman Date of Birth/Sex: Male) Other Clinician: Primary Care Physician: NNODI, ADAKU Treating STONE III, Makynzi Eastland Referring Physician: Buddy Duty, JEFFREY Physician/Extender: Weeks in Treatment: 7 Debridement Performed for Wound #6 Left,Lateral Foot Assessment: Performed By: Physician STONE III, Krina Mraz E., PA-C Debridement: Open Wound/Selective Debridement Selective Description: Pre-procedure Yes - 13:19 Verification/Time Out Taken: Start Time: 13:19 Pain Control: Lidocaine 4% Topical Solution Level: Non-Viable Tissue Total Area Debrided (L x 1.5 (cm) x 1 (cm) = 1.5 (cm) W): Tissue and other Non-Viable, Callus, Skin material debrided: Instrument: Curette, Forceps Bleeding: None End Time: 13:22 Procedural Pain: 0 Post Procedural Pain: 0 Response to Treatment: Procedure was tolerated well Post Debridement Measurements  of Total Wound Length: (cm) 1.5 Width: (cm) 1 Depth: (cm) 0.2 Volume: (cm) 0.236 Character of Wound/Ulcer Post Stable Debridement: Severity of Tissue Post Debridement: Fat layer exposed Post Procedure Diagnosis Same as Pre-procedure Electronic Signature(s) Signed: 04/30/2016 5:16:16 PM By: Regan Lemming BSN, RN Wyffels, [name] ([id number]) Signed: 05/01/2016 1:37:38 AM By: Worthy Keeler PA-C Entered By: Worthy Keeler on 04/30/2016 13:48:01 HARRY, TOLER ([id number]) -------------------------------------------------------------------------------- HPI Details Patient Name: [name] 04/30/2016 12:45 Date of Service: PM Medical Record [id number] Number: Patient Account Number: [id number] 08-May-1961 (55 y.o. Treating RN: Baruch Gouty, RN, BSN, Velva Harman Date of Birth/Sex: Male) Other Clinician: Primary Care Physician: NNODI, ADAKU Treating STONE III, Donja Tipping Referring Physician: Buddy Duty, JEFFREY Physician/Extender: Weeks in Treatment: 7 History of Present Illness HPI Description: Pleasant 55 year old with history of diabetes (Hgb A1c 10.8 in 2014) and peripheral neuropathy. No PVD. L ABI 1.1. Status post right great toe partial amputation years ago. He was at work and on 10/22/2014, was injured by a cart, and suffered an ulceration to his left anterior calf. He says that it subsequently became infected, and he was treated with a course of antibiotics. He was found on initial exam to have an ulceration on the dorsum of his left third toe. He was unaware of this and attributes it to pressure from his steel toed boots. More recently he injured his right anterior calf on a cart. Ambulating normally per his baseline. He has been undergoing regular debridements, applying mupirocin cream, and an Ace wrap for edema control. He returns to clinic for follow-up and is without complaints. No pain. No fever or chills. No drainage. 10/25/15; this is a 55 year old man who has type II diabetes with  diabetic polyneuropathy. He tells me that he fractured his left fifth metatarsal in June 2016 when he presented with swelling. He does not recall a specific injury. His hemoglobin A1c was apparently too high at the time for consideration of surgery and he was put in some form of  offloading. Ultimately he went to surgery in December with an allograft from his calcaneus to this site, plate and screws. He had an x-ray of the foot in March that showed concerns about nonunion. He tells me that in March he had to move and basically moved himself. He was on his foot a lot and then noticed some drainage from an open area. He has been following with his orthopedic surgeon Dr. Doran Durand. He has been applying a felt donut, dry dressing and using his heel healing sandal. 11/01/15; this is a patient I saw last week for the first time. He had a small open wound on the plantar aspect of his left foot at roughly the level of the base of his fifth metatarsal. He had a considerable degree of thickened skin around this wound on the plantar aspect which I thought was from chronic pressure on this area. He tells [place] that he had drainage over the course of the week. No systemic symptoms. 11/08/15; culture last week grew Citrobacter korseri. This should've been sensitive to the Augmentin I gave him. He has seen Dr. Doran Durand who did his initial surgery and according the patient the plan is to give this another month and then the hardware might need to come out of this. This seems like a reasonable plan. I will adjust his antibiotics to ciprofloxacin which probably should continue for at least another 2 weeks. I gave him 10 days worth today 11/22/15 the patient has completed antibiotics. He has an appointment with Dr. Doran Durand this Friday. There is improved dimensions around the wound on the left fifth metatarsal base 11/29/15; the patient has completed antibiotics last week. Apparently his appointment with Dr. Doran Durand it is not until  this Friday. Dimensions are roughly the same. 12/06/15; saw Dr. Doran Durand. No x-ray told the end of the month, next appointment June 30. We have been using Aquacel Ag 12/13/15: No major change this week. Using Aquacel AG 621/17; arrived this week with maceration around the wound. There was quite a bit of undermining which LEALAND, RIZK. ([id number]) required surgical debridement. I changed him to [facility] last week, by the patient's admission he was up on this more this week 12/27/15; macerated tissue around the wound is removed with a scalpel and pickups. There is no undermining. Nonviable subcutaneous tissue and skin taken from the superior circumference of the wound is slough from the surface. READMISSION 03/06/16 since I last saw this patient at the end of June, he went for surgery on 01/11/16 by Dr. Doran Durand of orthopedics. He had a left foot irrigation and debridement, removal of hardware and placement of wound VAC. He is also been followed by Dr. Megan Salon of infectious disease and completed a six-week course of IV Rocephin for group B strep and Enterobacter in the bone at the time of surgery. Apparently at the time of surgery the bone looked healthy so I don't think any bone was actually removed. He has been using silver alginate based dressings on the same wound area at the base of the left fifth metatarsal on the left. I note that he is also had arterial studies on 01/08/16, these showed a left ABI of 1.2 to and a right ABI of 1.3. Waveforms were listed as biphasic. He was not felt to have any specific arterial issues. 03/13/16; no real change in the condition of the wound at the left lateral foot at roughly the base of his fifth metatarsal. Use silver alginate last week. 03/18/16 arrives today with  no open area. Being suspicious of the overlying callus I. Some of this back although I see nothing but covering tissue here/epithelium. There is no surrounding tenderness READMISSION 04/16/16  this is a patient I discharged about a month ago. Initially a surgical wound on the lateral aspect of his left foot which subsequently became infected. The story he is giving today that he went back into his own modified shoe started to notice pain 2 weeks ago he was seen in Dr. Nona Dell office by a physician assistant last Wednesday and according the patient was told that everything looked fine however this is clearly now broken open and he has an open wound in the same spot that we have been dealing with repetitively. Situation is complicated by the fact that he is running short of money on long-term disability. He has not taken his insulin and at least 2 weeks was previously on NovoLog short acting insulin on a sliding scale and TRESIBA 35 units at bedtime. He is no longer able to afford any of his medication he was in the x-ray on 10/15. A plain x-ray showed healed fracture of the left fifth metatarsal bone status post removal of the associated plate and screw fixation hardware. There was no acute appearing osseous abnormality. His blood work showed a white count of 9.2 with an essentially normal differential comprehensive metabolic panel was normal. Previous CT scan of the foot on 01/08/16 showed no osteomyelitis previous vascular workup showed no evidence of significant PAD on 01/13/16 04/23/16; culture I did last week grew enterococcus [ampicillin sensitive] and MRSA. He saw infectious disease yesterday. They stop the clindamycin and ordered an MRI. This is not unreasonable. All the hardware is out of the foot at this point. 04/30/16 at this point in time we are still awaiting the results of the MRI at this point in time. Patient did have an area which appears to be somewhat macerated in the proximal portion of the wound where there is overlying necrotic skin that is doing nothing more than trapping fluid underneath. He continues to state that he does have some discomfort and again the  concern is for the possibility of osteomyelitis hence the reason for the MRI order. We have been using a silver alginate dressing but again I think the reason this with his macerated as it was is that the dressing obscene could not reach the entirety of the wound due to some necrotic skin covering the proximal portion. No bleeding noted at this point in time on initial evaluation. Electronic Signature(s) Signed: 05/01/2016 1:37:38 AM By: Vic Blackbird, [place] ([id number]) Entered By: Worthy Keeler on 04/30/2016 13:50:25 NICHALAS, SHUPE ([id number]) -------------------------------------------------------------------------------- Physical Exam Details Patient Name: CHARLE, PAPARELLA 04/30/2016 12:45 Date of Service: PM Medical Record [id number] Number: Patient Account Number: [id number] 10-01-1960 (55 y.o. Treating RN: Baruch Gouty, RN, BSN, Velva Harman Date of Birth/Sex: Male) Other Clinician: Primary Care Physician: NNODI, ADAKU Treating STONE III, Lajuan Godbee Referring Physician: Buddy Duty, JEFFREY Physician/Extender: Weeks in Treatment: 7 Constitutional Well-nourished and well-hydrated in no acute distress. Respiratory normal breathing without difficulty. Cardiovascular 1+ dorsalis pedis pulses. Psychiatric this patient is able to make decisions and demonstrates good insight into disease process. Alert and Oriented x 3. pleasant and cooperative. Notes Patient has a macerated wound at this point in time and the distal aspect of the wound is actually open whereas the proximal aspect has a thin layer of necrotic skin which was actually trapping some fluid buildup on initial presentation today.  Subsequently he does have callus surrounding the wound as well. I did perform a selective debridement effectively today with a curet removing callus as well as necrotic skin from the wound bed. This will allow for the silver alginate dressing taxi, contact with the wound bed which I believe will  definitely improve healing overall. Electronic Signature(s) Signed: 05/01/2016 1:37:38 AM By: Worthy Keeler PA-C Entered By: Worthy Keeler on 04/30/2016 13:52:01 TRUBY, SHEU ([id number]) -------------------------------------------------------------------------------- Physician Orders Details Patient Name: IZAAC, DONNALLY 04/30/2016 12:45 Date of Service: PM Medical Record [id number] Number: Patient Account Number: [id number] 16-Apr-1961 (55 y.o. Treating RN: Afful, RN, BSN, Velva Harman Date of Birth/Sex: Male) Other Clinician: Primary Care Physician: NNODI, ADAKU Treating STONE III, Britlee Skolnik Referring Physician: Buddy Duty, JEFFREY Physician/Extender: Weeks in Treatment: 7 Verbal / Phone Orders: Yes Clinician: Afful, RN, BSN, Rita Read Back and Verified: Yes Diagnosis Coding Blood Glucose Testing Wound #6 Left,Lateral Foot o Finger stick in clinic as a component of the assessment of chronic ulcer Wound Cleansing Wound #6 Left,Lateral Foot o Cleanse wound with mild soap and water o May Shower, gently pat wound dry prior to applying new dressing. o May shower with protection. Anesthetic Wound #6 Left,Lateral Foot o Topical Lidocaine 4% cream applied to wound bed prior to debridement Primary Wound Dressing Wound #6 Left,Lateral Foot o Sorbalgon Ag Secondary Dressing Wound #6 Left,Lateral Foot o Gauze and Kerlix/Conform Dressing Change Frequency Wound #6 Left,Lateral Foot o Change dressing every day. Follow-up Appointments Wound #6 Left,Lateral Foot o Return Appointment in 1 week. Off-Loading Wound #6 Left,Lateral Foot o Open toe surgical shoe with peg assist. ORYAN, MABEN. ([id number]) Additional Orders / Instructions Wound #6 Left,Lateral Foot o Increase protein intake. o Activity as tolerated Medications-please add to medication list. Wound #6 Left,Lateral Foot o P.O. Antibiotics - AMOXIL 500MG  PO TID FOR 7 DAYS o P.O. Antibiotics - SEPTRA  DS 1 BY MOUTH TWICE A DAY FOR 7 DAYS Patient Medications Allergies: No Known Allergies Notifications Medication Indication Start End Bactrim DS 04/23/2016 DOSE oral 800 mg-160 mg tablet - tablet oral amoxicillin 04/23/2016 DOSE oral 500 mg capsule - capsule oral Electronic Signature(s) Signed: 04/30/2016 5:16:16 PM By: Regan Lemming BSN, RN Signed: 05/01/2016 1:37:38 AM By: Worthy Keeler PA-C Entered By: Regan Lemming on 04/30/2016 13:30:25 LAVERNE, SERIGHT ([id number]) -------------------------------------------------------------------------------- Prescription 04/30/2016 Patient Name: [name]. Physician: Worthy Keeler PA-C Date of Birth: [birth year] NPI#: [id number] Sex: [name] DEA#: [id number] Phone #: [phone] License #: Patient Address: [address] [place], [place] [address] [address], [place], [place] [address] [phone] Allergies No Known Allergies Physician's Orders P.O. Antibiotics - AMOXIL 500MG  PO TID FOR 7 DAYS Signature(s): Date(s): KLINTON, ALDRIDGE ([id number]) Prescription 04/30/2016 Patient Name: JERMANIE, CARNAL. Physician: Worthy Keeler PA-C Date of Birth: 11-19-1960 NPI#: [id number] Sex: [name] DEA#: [id number] Phone #: [phone] License #: Patient Address: [address] [place], [place] [address] [address], [place], [place] [address] [phone] Allergies No Known Allergies Medication Medication: Route: Strength: Form: Bactrim DS 800 mg-160 mg oral 800 mg-160 mg tablet tablet Class: ABSORBABLE SULFONAMIDE ANTIBACTERIAL AGENTS Dose: Frequency / Time: Indication: tablet oral Number of Refills: Number of Units: 0 Generic Substitution: Start Date: End Date: One Time Use: Substitution Permitted [id number] No Note to  Pharmacy: Signature(s): Date(s): SHAKA, HUNEKE ([id number]) Prescription 04/30/2016 Patient Name:  Sherian Rein F. Physician: Worthy Keeler PA-C Date of Birth: 11-15-1960 NPI#: [id number] Sex: [name] DEA#: [id number] Phone #: [phone] License #: Patient Address: [address] [place], [place] [address] [address], [place], [place] [address] [phone] Allergies No Known Allergies Medication Medication: Route: Strength: Form: amoxicillin 500 mg capsule oral 500 mg capsule Class: PENICILLINS Dose: Frequency / Time: Indication: capsule oral Number of Refills: Number of Units: 0 Generic Substitution: Start Date: End Date: One Time Use: Substitution Permitted [id number] No Note to Pharmacy: [facility]): Date(s): Electronic Signature(s) BURNEY, VICTORINE ([id number]) Signed: 04/30/2016 5:16:16 PM By: Regan Lemming BSN, RN Signed: 05/01/2016 1:37:38 AM By: Worthy Keeler PA-C Entered By: Regan Lemming on 04/30/2016 13:30:27 DELTON, WINDLEY ([id number]) --------------------------------------------------------------------------------  Problem List Details Patient Name: SAJAD, PENTZ 04/30/2016 12:45 Date of Service: PM Medical Record [id number] Number: Patient Account Number: [id number] 1960-09-20 (55 y.o. Treating RN: Afful, RN, BSN, Velva Harman Date of Birth/Sex: Male) Other Clinician: Primary Care Physician: NNODI, ADAKU Treating STONE III, Hanaa Payes Referring Physician: Buddy Duty, JEFFREY Physician/Extender: Weeks in Treatment: 7 Active Problems ICD-10 Encounter Code Description Active Date Diagnosis E11.621 Type 2 diabetes mellitus with foot ulcer 03/06/2016 Yes L97.523 Non-pressure chronic ulcer of other part of left foot with 03/06/2016 Yes necrosis of muscle M86.672 Other chronic osteomyelitis, left ankle and foot 03/06/2016 Yes Inactive Problems Resolved  Problems Electronic Signature(s) Signed: 05/01/2016 1:37:38 AM By: Worthy Keeler PA-C Entered By: Worthy Keeler on 04/30/2016 13:47:07 [name] ([id number]) -------------------------------------------------------------------------------- Progress Note Details Patient Name: SHEEL, PANTHER 04/30/2016 12:45 Date of Service: PM Medical Record [id number] Number: Patient Account Number: [id number] 05/28/1961 (55 y.o. Treating RN: Baruch Gouty, RN, BSN, Velva Harman Date of Birth/Sex: Male) Other Clinician: Primary Care Physician: NNODI, ADAKU Treating STONE III, Lysha Schrade Referring Physician: Buddy Duty, JEFFREY Physician/Extender: Weeks in Treatment: 7 Subjective Chief Complaint Information obtained from Patient 03/06/16; patient returns today after further surgery on the left foot as well as a 6 week course of IV antibiotics History of Present Illness (HPI) Pleasant 55 year old with history of diabetes (Hgb A1c 10.8 in 2014) and peripheral neuropathy. No PVD. L ABI 1.1. Status post right great toe partial amputation years ago. He was at work and on 10/22/2014, was injured by a cart, and suffered an ulceration to his left anterior calf. He says that it subsequently became infected, and he was treated with a course of antibiotics. He was found on initial exam to have an ulceration on the dorsum of his left third toe. He was unaware of this and attributes it to pressure from his steel toed boots. More recently he injured his right anterior calf on a cart. Ambulating normally per his baseline. He has been undergoing regular debridements, applying mupirocin cream, and an Ace wrap for edema control. He returns to clinic for follow-up and is without complaints. No pain. No fever or chills. No drainage. 10/25/15; this is a 55 year old man who has type II diabetes with diabetic polyneuropathy. He tells me that he fractured his left fifth metatarsal in June 2016 when he presented with swelling. He does not  recall a specific injury. His hemoglobin A1c was apparently too high at the time for consideration of surgery and he was put in some form of offloading. Ultimately he went to surgery in December with an allograft from his calcaneus to this site, plate and screws. He had an x-ray of the foot in March that showed concerns  about nonunion. He tells me that in March he had to move and basically moved himself. He was on his foot a lot and then noticed some drainage from an open area. He has been following with his orthopedic surgeon Dr. Doran Durand. He has been applying a felt donut, dry dressing and using his heel healing sandal. 11/01/15; this is a patient I saw last week for the first time. He had a small open wound on the plantar aspect of his left foot at roughly the level of the base of his fifth metatarsal. He had a considerable degree of thickened skin around this wound on the plantar aspect which I thought was from chronic pressure on this area. He tells [place] that he had drainage over the course of the week. No systemic symptoms. 11/08/15; culture last week grew Citrobacter korseri. This should've been sensitive to the Augmentin I gave him. He has seen Dr. Doran Durand who did his initial surgery and according the patient the plan is to give this another month and then the hardware might need to come out of this. This seems like a reasonable plan. I will adjust his antibiotics to ciprofloxacin which probably should continue for at least another 2 weeks. I gave him 10 days worth today 11/22/15 the patient has completed antibiotics. He has an appointment with Dr. Doran Durand this Friday. There is improved dimensions around the wound on the left fifth metatarsal base BRAIAN, MERRIGAN ([id number]) 11/29/15; the patient has completed antibiotics last week. Apparently his appointment with Dr. Doran Durand it is not until this Friday. Dimensions are roughly the same. 12/06/15; saw Dr. Doran Durand. No x-ray told the end of the month,  next appointment June 30. We have been using Aquacel Ag 12/13/15: No major change this week. Using Aquacel AG 621/17; arrived this week with maceration around the wound. There was quite a bit of undermining which required surgical debridement. I changed him to [facility] last week, by the patient's admission he was up on this more this week 12/27/15; macerated tissue around the wound is removed with a scalpel and pickups. There is no undermining. Nonviable subcutaneous tissue and skin taken from the superior circumference of the wound is slough from the surface. READMISSION 03/06/16 since I last saw this patient at the end of June, he went for surgery on 01/11/16 by Dr. Doran Durand of orthopedics. He had a left foot irrigation and debridement, removal of hardware and placement of wound VAC. He is also been followed by Dr. Megan Salon of infectious disease and completed a six-week course of IV Rocephin for group B strep and Enterobacter in the bone at the time of surgery. Apparently at the time of surgery the bone looked healthy so I don't think any bone was actually removed. He has been using silver alginate based dressings on the same wound area at the base of the left fifth metatarsal on the left. I note that he is also had arterial studies on 01/08/16, these showed a left ABI of 1.2 to and a right ABI of 1.3. Waveforms were listed as biphasic. He was not felt to have any specific arterial issues. 03/13/16; no real change in the condition of the wound at the left lateral foot at roughly the base of his fifth metatarsal. Use silver alginate last week. 03/18/16 arrives today with no open area. Being suspicious of the overlying callus I. Some of this back although I see nothing but covering tissue here/epithelium. There is no surrounding tenderness READMISSION 04/16/16 this is a  patient I discharged about a month ago. Initially a surgical wound on the lateral aspect of his left foot which subsequently  became infected. The story he is giving today that he went back into his own modified shoe started to notice pain 2 weeks ago he was seen in Dr. Nona Dell office by a physician assistant last Wednesday and according the patient was told that everything looked fine however this is clearly now broken open and he has an open wound in the same spot that we have been dealing with repetitively. Situation is complicated by the fact that he is running short of money on long-term disability. He has not taken his insulin and at least 2 weeks was previously on NovoLog short acting insulin on a sliding scale and TRESIBA 35 units at bedtime. He is no longer able to afford any of his medication he was in the x-ray on 10/15. A plain x-ray showed healed fracture of the left fifth metatarsal bone status post removal of the associated plate and screw fixation hardware. There was no acute appearing osseous abnormality. His blood work showed a white count of 9.2 with an essentially normal differential comprehensive metabolic panel was normal. Previous CT scan of the foot on 01/08/16 showed no osteomyelitis previous vascular workup showed no evidence of significant PAD on 01/13/16 04/23/16; culture I did last week grew enterococcus [ampicillin sensitive] and MRSA. He saw infectious disease yesterday. They stop the clindamycin and ordered an MRI. This is not unreasonable. All the hardware is out of the foot at this point. 04/30/16 at this point in time we are still awaiting the results of the MRI at this point in time. Patient did have an area which appears to be somewhat macerated in the proximal portion of the wound where there is overlying necrotic skin that is doing nothing more than trapping fluid underneath. He continues to state that he does have some discomfort and again the concern is for the possibility of osteomyelitis hence the reason for the MRI order. We have been using a silver alginate dressing but again I  think the reason this with his RIAL, SABIR. ([id number]) macerated as it was is that the dressing obscene could not reach the entirety of the wound due to some necrotic skin covering the proximal portion. No bleeding noted at this point in time on initial evaluation. Objective Constitutional Well-nourished and well-hydrated in no acute distress. Vitals Time Taken: 12:59 PM, Height: 70 in, Weight: 260 lbs, BMI: 37.3, Temperature: 97.8 F, Pulse: 97 bpm, Respiratory Rate: 17 breaths/min, Blood Pressure: 142/69 mmHg. Respiratory normal breathing without difficulty. Cardiovascular 1+ dorsalis pedis pulses. Psychiatric this patient is able to make decisions and demonstrates good insight into disease process. Alert and Oriented x 3. pleasant and cooperative. General Notes: Patient has a macerated wound at this point in time and the distal aspect of the wound is actually open whereas the proximal aspect has a thin layer of necrotic skin which was actually trapping some fluid buildup on initial presentation today. Subsequently he does have callus surrounding the wound as well. I did perform a selective debridement effectively today with a curet removing callus as well as necrotic skin from the wound bed. This will allow for the silver alginate dressing taxi, contact with the wound bed which I believe will definitely improve healing overall. Integumentary (Hair, Skin) Wound #6 status is Open. Original cause of wound was Gradually Appeared. The wound is located on the Left,Lateral Foot. The wound measures 1.5cm length  x 1cm width x 0.3cm depth; 1.178cm^2 area and 0.353cm^3 volume. The wound is limited to skin breakdown. There is no tunneling or undermining noted. There is a large amount of serosanguineous drainage noted. The wound margin is thickened. There is medium (34-66%) pink, pale granulation within the wound bed. There is a medium (34-66%) amount of necrotic tissue within the wound bed  including Adherent Slough. The periwound skin appearance exhibited: Callus, Maceration, Moist. The periwound skin appearance did not exhibit: Crepitus, Excoriation, Fluctuance, Friable, Induration, Localized Edema, Rash, Scarring, Dry/Scaly, Atrophie Blanche, Cyanosis, Ecchymosis, Hemosiderin Staining, Mottled, Pallor, Rubor, Erythema. Periwound temperature was noted as No Abnormality. The periwound has tenderness on palpation. 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 There was a Non-Viable Tissue Open Wound/Selective [phone]) debridement with total area of 1.5 sq cm performed by STONE III, Lluvia Gwynne E., PA-C. with the following instrument(s): Curette and Forceps to remove Non-Viable tissue/material including Skin and Callus after achieving pain control using Lidocaine 4% Topical Solution. A time out was conducted at 13:19, prior to the start of the procedure. There was no bleeding. The procedure was tolerated well with a pain level of 0 throughout and a pain level of 0 following the procedure. Post Debridement Measurements: 1.5cm length x 1cm width x 0.2cm depth; 0.236cm^3 volume. Character of Wound/Ulcer Post Debridement is stable. Severity of Tissue Post Debridement is: Fat layer exposed. Post procedure Diagnosis Wound #6: Same as Pre-Procedure Plan Blood Glucose Testing: Wound #6 Left,Lateral Foot: Finger stick in clinic as a component of the  assessment of chronic ulcer Wound Cleansing: Wound #6 Left,Lateral Foot: Cleanse wound with mild soap and water May Shower, gently pat wound dry prior to applying new dressing. May shower with protection. MELVERN, FAUBION ([id number]) Anesthetic: Wound #6 Left,Lateral Foot: Topical Lidocaine 4% cream applied to wound bed prior to debridement Primary Wound Dressing: Wound #6 Left,Lateral Foot: Sorbalgon Ag Secondary Dressing: Wound #6 Left,Lateral Foot: Gauze and Kerlix/Conform Dressing Change Frequency: Wound #6 Left,Lateral Foot: Change dressing every day. Follow-up Appointments: Wound #6 Left,Lateral Foot: Return Appointment in 1 week. Off-Loading: Wound #6 Left,Lateral Foot: Open toe surgical shoe with peg assist. Additional Orders / Instructions: Wound #6 Left,Lateral Foot: Increase protein intake. Activity as tolerated Medications-please add to medication list.: Wound #6 Left,Lateral Foot: P.O. Antibiotics - AMOXIL 500MG  PO TID FOR 7 DAYS P.O. Antibiotics - SEPTRA DS 1 BY MOUTH TWICE A DAY FOR 7 DAYS The following medication(s) was prescribed: Bactrim DS oral 800 mg-160 mg tablet tablet oral starting 04/23/2016 amoxicillin oral 500 mg capsule capsule oral starting 04/23/2016 Patient tolerated the debridement without complication today and no pain. We will continue with a silver alginate dressing at this point as well as the Kerlex and Coban. We will see him back for reevaluation in one week and in the interim he will be having his MRI this coming Sunday. All questions were encouraged and answered to the best my ability today and we will see him at reevaluation time hopefully with the results of  the MRI and hand as well. Electronic Signature(s) Signed: 05/01/2016 1:37:38 AM By: Worthy Keeler PA-C Entered By: Worthy Keeler on 05/01/2016 00:28:35 NICHOLAI, BOERBOOM ([id number] Brood, [name]  ([id number]) -------------------------------------------------------------------------------- SuperBill Details Patient Name: DEAKYN, [place]ZEN[place]G. Date of Service: 04/30/2016 Medical Record Number: [id number] Patient Account Number: [id number] Date of Birth/Sex: 08-Feb-1961 (55 y.o. 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 M86.672 Other chronic osteomyelitis, left ankle and foot Facility Procedures CPT4 Code Description: [id number] - DEBRIDE WOUND 1ST 20 SQ CM OR < ICD-10 Description Diagnosis L97.523 Non-pressure chronic ulcer of other part of left foot Modifier: with necrosi Quantity: 1 s of muscle Physician Procedures CPT4 Code Description: [id number] - WC PHYS DEBR WO ANESTH 20 SQ CM ICD-10 Description Diagnosis L97.523 Non-pressure chronic ulcer of other part of left foot Modifier: with necrosis Quantity: 1 of muscle Electronic Signature(s) Signed: 05/01/2016 1:37:38 AM By: Worthy Keeler PA-C Entered By: Worthy Keeler on 04/30/2016 13:54:50

## 2016-05-01 NOTE — Progress Notes (Signed)
SIRRON, LOGA ([id number]) Visit Report for 04/30/2016 Arrival Information Details Patient Name: [name], [name]. Date of Service: 04/30/2016 12:45 PM Medical Record Number: [id number] Patient Account Number: [id number] Date of Birth/Sex: 03-Jan-1961 (55 y.o. Male) Treating RN: Baruch Gouty, RN, BSN, Velva Harman Primary Care Physician: Gavin Pound Other Clinician: Referring Physician: Buddy Duty, JEFFREY Treating Physician/Extender: Melburn Hake, HOYT Weeks in Treatment: 7 Visit Information History Since Last Visit All ordered tests and consults were completed: No Patient Arrived: Ambulatory Added or deleted any medications: No Arrival Time: 12:58 Any new allergies or adverse reactions: No Accompanied By: self Had a fall or experienced change in No Transfer Assistance: None activities of daily living that may affect Patient Identification Verified: Yes risk of falls: Secondary Verification Process Yes Signs or symptoms of abuse/neglect since last No Completed: visito Patient Requires Transmission-Based No Hospitalized since last visit: No Precautions: Has Dressing in Place as Prescribed: Yes Patient Has Alerts: No Has Footwear/Offloading in Place as Yes Prescribed: Left: Wedge Shoe Pain Present Now: Yes Electronic Signature(s) Signed: 04/30/2016 5:16:16 PM By: Regan Lemming BSN, RN Entered By: Regan Lemming on 04/30/2016 12:59:14 [name] ([id number]) -------------------------------------------------------------------------------- Encounter Discharge Information Details Patient Name: [name]. Date of Service: 04/30/2016 12:45 PM Medical Record Number: [id number] Patient Account Number: [id number] Date of Birth/Sex: [birth year] (55 y.o. Male) Treating RN: Baruch Gouty, RN, BSN, Velva Harman Primary Care Physician: Gavin Pound Other Clinician: Referring Physician: Buddy Duty, JEFFREY Treating Physician/Extender: Melburn Hake, HOYT Weeks in Treatment: 7 Encounter Discharge Information  Items Discharge Pain Level: 0 Discharge Condition: Stable Ambulatory Status: Ambulatory Discharge Destination: Home Private Transportation: Auto Accompanied By: self Schedule Follow-up Appointment: No Medication Reconciliation completed and No provided to Patient/Care Marguetta Windish: Clinical Summary of Care: Electronic Signature(s) Signed: 04/30/2016 5:16:16 PM By: Regan Lemming BSN, RN Previous Signature: 04/30/2016 1:33:52 PM Version By: Ruthine Dose Entered By: Regan Lemming on 04/30/2016 13:33:57 [name] ([id number]) -------------------------------------------------------------------------------- Lower Extremity Assessment Details Patient Name: [name]. Date of Service: 04/30/2016 12:45 PM Medical Record Number: [id number] Patient Account Number: [id number] Date of Birth/Sex: September 14, 1960 (55 y.o. Male) Treating RN: Baruch Gouty, RN, BSN, Velva Harman Primary Care Physician: Gavin Pound Other Clinician: Referring Physician: Buddy Duty, JEFFREY Treating Physician/Extender: Melburn Hake, HOYT Weeks in Treatment: 7 Vascular Assessment Pulses: Posterior Tibial Dorsalis Pedis Palpable: [Left:Yes] Extremity colors, hair growth, and conditions: Extremity Color: [Left:Dusky] Hair Growth on Extremity: [Left:No] Temperature of Extremity: [Left:Warm] Capillary Refill: [Left:< 3 seconds] Toe Nail Assessment Left: Right: Thick: Yes Discolored: Yes Deformed: No Improper Length and Hygiene: No Electronic Signature(s) Signed: 04/30/2016 5:16:16 PM By: Regan Lemming BSN, RN Entered By: Regan Lemming on 04/30/2016 13:01:08 [name] ([id number]) -------------------------------------------------------------------------------- Multi Wound Chart Details Patient Name: [name]. Date of Service: 04/30/2016 12:45 PM Medical Record Number: [id number] Patient Account Number: [id number] Date of Birth/Sex: May 09, 1961 (55 y.o. Male) Treating RN: Baruch Gouty, RN, BSN, Velva Harman Primary Care Physician:  Gavin Pound Other Clinician: Referring Physician: Buddy Duty, JEFFREY Treating Physician/Extender: STONE III, HOYT Weeks in Treatment: 7 Vital Signs Height(in): 70 Pulse(bpm): 97 Weight(lbs): 260 Blood Pressure 142/69 (mmHg): Body Mass Index(BMI): 37 Temperature(F): 97.8 Respiratory Rate 17 (breaths/min): Photos: [6:No Photos] [N/A:N/A] Wound Location: [6:Left Foot - Lateral] [N/A:N/A] Wounding Event: [6:Gradually Appeared] [N/A:N/A] Primary Etiology: [6:Diabetic Wound/Ulcer of N/A the Lower Extremity] Comorbid History: [6:Hypertension, Type II Diabetes, Neuropathy] [N/A:N/A] Date Acquired: [6:03/25/2016] [N/A:N/A] Weeks of Treatment: [6:2] [N/A:N/A] Wound Status: [6:Open] [N/A:N/A] Measurements L x W x D 1.5x1x0.3 [N/A:N/A] (cm) Area (cm) : [6:1.178] [N/A:N/A] Volume (cm) : [6:0.353] [N/A:N/A] % Reduction in Area: [  6:75.00%] [N/A:N/A] % Reduction in Volume: 62.50% [N/A:N/A] Classification: [6:Unable to visualize wound N/A bed] Exudate Amount: [6:Large] [N/A:N/A] Exudate Type: [6:Serosanguineous] [N/A:N/A] Exudate Color: [6:red, brown] [N/A:N/A] Foul Odor After [6:Yes] [N/A:N/A] Cleansing: Odor Anticipated Due to No [N/A:N/A] Product Use: Wound Margin: [6:Thickened] [N/A:N/A] Granulation Amount: [6:Medium (34-66%)] [N/A:N/A] Granulation Quality: [6:Pink, Pale] [N/A:N/A] Necrotic Amount: [6:Medium (34-66%)] [N/A:N/A] Exposed Structures: [N/A:N/A] Fascia: No Fat: No Tendon: No Muscle: No Joint: No Bone: No Limited to Skin Breakdown Epithelialization: None N/A N/A Periwound Skin Texture: Callus: Yes N/A N/A Edema: No Excoriation: No Induration: No Crepitus: No Fluctuance: No Friable: No Rash: No Scarring: No Periwound Skin Maceration: Yes N/A N/A Moisture: Moist: Yes Dry/Scaly: No Periwound Skin Color: Atrophie Blanche: No N/A N/A Cyanosis: No Ecchymosis: No Erythema: No Hemosiderin Staining: No Mottled: No Pallor: No Rubor: No Temperature: No  Abnormality N/A N/A Tenderness on Yes N/A N/A Palpation: Wound Preparation: Ulcer Cleansing: N/A N/A Rinsed/Irrigated with Saline Topical Anesthetic Applied: Other: Lidocaine 4% Treatment Notes Electronic Signature(s) Signed: 04/30/2016 5:16:16 PM By: Regan Lemming BSN, RN Entered By: Regan Lemming on 04/30/2016 13:07:35 [name] ([id number]) -------------------------------------------------------------------------------- [place] Details Patient Name: [name], [name]. Date of Service: 04/30/2016 12:45 PM Medical Record Number: [id number] Patient Account Number: [id number] Date of Birth/Sex: 1960-07-09 (55 y.o. Male) Treating RN: Baruch Gouty, RN, BSN, Velva Harman Primary Care Physician: Gavin Pound Other Clinician: Referring Physician: Buddy Duty, JEFFREY Treating Physician/Extender: Melburn Hake, HOYT Weeks in Treatment: 7 Active Inactive Orientation to the Wound Care Program Nursing Diagnoses: Knowledge deficit related to the wound healing center program Goals: Patient/caregiver will verbalize understanding of the [place] Program Date Initiated: 04/16/2016 Goal Status: Active Interventions: Provide education on orientation to the wound center Notes: Wound/Skin Impairment Nursing Diagnoses: Impaired tissue integrity Knowledge deficit related to ulceration/compromised skin integrity Goals: Patient/caregiver will verbalize understanding of skin care regimen Date Initiated: 04/16/2016 Goal Status: Active Ulcer/skin breakdown will have a volume reduction of 30% by week 4 Date Initiated: 04/16/2016 Goal Status: Active Ulcer/skin breakdown will have a volume reduction of 50% by week 8 Date Initiated: 04/16/2016 Goal Status: Active Ulcer/skin breakdown will have a volume reduction of 80% by week 12 Date Initiated: 04/16/2016 Goal Status: Active Ulcer/skin breakdown will heal within 14 weeks Date Initiated: 04/16/2016 [name], [name] ([id number]) Goal  Status: Active Interventions: Assess patient/caregiver ability to obtain necessary supplies Assess patient/caregiver ability to perform ulcer/skin care regimen upon admission and as needed Assess ulceration(s) every visit Provide education on ulcer and skin care Treatment Activities: Skin care regimen initiated : 03/06/2016 Topical wound management initiated : 03/06/2016 Notes: Electronic Signature(s) Signed: 04/30/2016 5:16:16 PM By: Regan Lemming BSN, RN Entered By: Regan Lemming on 04/30/2016 13:07:27 [name] ([id number]) -------------------------------------------------------------------------------- Pain Assessment Details Patient Name: [name]. Date of Service: 04/30/2016 12:45 PM Medical Record Number: [id number] Patient Account Number: [id number] Date of Birth/Sex: October 14, 1960 (55 y.o. Male) Treating RN: Baruch Gouty, RN, BSN, Velva Harman Primary Care Physician: Gavin Pound Other Clinician: Referring Physician: Buddy Duty, JEFFREY Treating Physician/Extender: Melburn Hake, HOYT Weeks in Treatment: 7 Active Problems Location of Pain Severity and Description of Pain Patient Has Paino Yes Site Locations Pain Location: Pain in Ulcers Rate the pain. Current Pain Level: 3 Character of Pain Describe the Pain: Aching, Tender Pain Management and Medication Current Pain Management: How does your pain impact your activities of daily livingo Sleep: Yes Bathing: Yes Appetite: Yes Relationship With Others: Yes Bladder Continence: Yes Emotions: Yes Bowel Continence: Yes Work: Yes Toileting: Yes Drive: Yes Dressing: Yes  Hobbies: Yes Electronic Signature(s) Signed: 04/30/2016 5:16:16 PM By: Regan Lemming BSN, RN Entered By: Regan Lemming on 04/30/2016 12:59:32 [name] ([id number]) -------------------------------------------------------------------------------- Patient/Caregiver Education Details Patient Name: [name]. Date of Service: 04/30/2016 12:45 PM Medical Record  Number: [id number] Patient Account Number: [id number] Date of Birth/Gender: [birth year] (55 y.o. Male) Treating RN: Baruch Gouty, RN, BSN, Velva Harman Primary Care Physician: Gavin Pound Other Clinician: Referring Physician: Buddy Duty, JEFFREY Treating Physician/Extender: Sharalyn Ink in Treatment: 7 Education Assessment Education Provided To: Patient Education Topics Provided Welcome To The [facility]: Methods: Explain/Verbal Responses: State content correctly Wound/Skin Impairment: Methods: Explain/Verbal Responses: State content correctly Electronic Signature(s) Signed: 04/30/2016 5:16:16 PM By: Regan Lemming BSN, RN Entered By: Regan Lemming on 04/30/2016 13:34:11 [name] ([id number]) -------------------------------------------------------------------------------- Wound Assessment Details Patient Name: [name]. Date of Service: 04/30/2016 12:45 PM Medical Record Number: [id number] Patient Account Number: [id number] Date of Birth/Sex: [birth year] (55 y.o. Male) Treating RN: Baruch Gouty, RN, BSN, [place] Primary Care Physician: Gavin Pound Other Clinician: Referring Physician: Buddy Duty, JEFFREY Treating Physician/Extender: STONE III, HOYT Weeks in Treatment: 7 Wound Status Wound Number: 6 Primary Diabetic Wound/Ulcer of the Lower Etiology: Extremity Wound Location: Left Foot - Lateral Wound Status: Open Wounding Event: Gradually Appeared Comorbid Hypertension, Type II Diabetes, Date Acquired: 03/25/2016 History: Neuropathy Weeks Of Treatment: 2 Clustered Wound: No Photos Photo Uploaded By: Regan Lemming on 04/30/2016 17:08:28 Wound Measurements Length: (cm) 1.5 Width: (cm) 1 Depth: (cm) 0.3 Area: (cm) 1.178 Volume: (cm) 0.353 % Reduction in Area: 75% % Reduction in Volume: 62.5% Epithelialization: None Tunneling: No Undermining: No Wound Description Classification: Unable to visualize wound bed Foul Odor Af Wound Margin: Thickened Due to Produ Exudate Amount:  Large Exudate Type: Serosanguineous Exudate Color: red, brown ter Cleansing: Yes ct Use: No Wound Bed Granulation Amount: Medium (34-66%) Exposed Structure Granulation Quality: Pink, Pale Fascia Exposed: No Necrotic Amount: Medium (34-66%) Fat Layer Exposed: No Necrotic Quality: Adherent Slough Tendon Exposed: No Muscle Exposed: No Joint Exposed: No Bone Exposed: No [name], [name]. ([id number]) Limited to Skin Breakdown Periwound Skin Texture Texture Color No Abnormalities Noted: No No Abnormalities Noted: No Callus: Yes Atrophie Blanche: No Crepitus: No Cyanosis: No Excoriation: No Ecchymosis: No Fluctuance: No Erythema: No Friable: No Hemosiderin Staining: No Induration: No Mottled: No Localized Edema: No Pallor: No Rash: No Rubor: No Scarring: No Temperature / Pain Moisture Temperature: No Abnormality No Abnormalities Noted: No Tenderness on Palpation: Yes Dry / Scaly: No Maceration: Yes Moist: Yes Wound Preparation Ulcer Cleansing: Rinsed/Irrigated with Saline Topical Anesthetic Applied: Other: Lidocaine 4%, Treatment Notes Wound #6 (Left, Lateral Foot) 1. Cleansed with: Clean wound with Normal Saline 4. Dressing Applied: Aquacel Ag 5. Secondary Dressing Applied Gauze and Kerlix/Conform 6. Footwear/Offloading device applied Other footwear/offloading device applied (specify in notes) 7. Secured with Tape Notes Biochemist, clinical) Signed: 04/30/2016 5:16:16 PM By: Regan Lemming BSN, RN Entered By: Regan Lemming on 04/30/2016 13:04:27 [name] ([id number]) -------------------------------------------------------------------------------- Vitals Details Patient Name: [name]. Date of Service: 04/30/2016 12:45 PM Medical Record Number: [id number] Patient Account Number: [id number] Date of Birth/Sex: 1961/04/23 (55 y.o. Male) Treating RN: Baruch Gouty, RN, BSN, [place] Primary Care Physician: Gavin Pound Other Clinician: Referring  Physician: Buddy Duty, JEFFREY Treating Physician/Extender: STONE III, HOYT Weeks in Treatment: 7 Vital Signs Time Taken: 12:59 Temperature (F): 97.8 Height (in): 70 Pulse (bpm): 97 Weight (lbs): 260 Respiratory Rate (breaths/min): 17 Body Mass Index (BMI): 37.3 Blood Pressure (mmHg): 142/69 Reference Range: 80 - 120 mg / dl  Electronic Signature(s) Signed: 04/30/2016 5:16:16 PM By: Regan Lemming BSN, RN Entered By: Regan Lemming on 04/30/2016 13:00:45

## 2016-05-02 ENCOUNTER — Other Ambulatory Visit: Payer: Self-pay | Admitting: Endocrinology

## 2016-05-02 MED ORDER — INSULIN ASPART 100 UNIT/ML FLEXPEN: PEN_INJECTOR | SUBCUTANEOUS | 4 refills | Status: DC

## 2016-05-02 NOTE — Telephone Encounter (Signed)
Please advise if ok to refill this medication. Thanks!

## 2016-05-05 ENCOUNTER — Ambulatory Visit
Admission: RE | Admit: 2016-05-05 | Discharge: 2016-05-05 | Disposition: A | Discharge status: Home / Self Care | Payer: Managed Care, Other (non HMO) | Source: Ambulatory Visit | Attending: Internal Medicine | Admitting: Internal Medicine

## 2016-05-05 DIAGNOSIS — L089 Local infection of the skin and subcutaneous tissue, unspecified: Principal | ICD-10-CM

## 2016-05-05 DIAGNOSIS — E11628 Type 2 diabetes mellitus with other skin complications: Secondary | ICD-10-CM

## 2016-05-05 MED ORDER — GADOBENATE DIMEGLUMINE 529 MG/ML IV SOLN
20.0000 mL | Freq: Once | INTRAVENOUS | Status: AC | PRN
  Administered 2016-05-05: 20 mL via INTRAVENOUS

## 2016-05-07 ENCOUNTER — Encounter: Payer: Self-pay | Admitting: *Deleted

## 2016-05-07 ENCOUNTER — Ambulatory Visit (INDEPENDENT_AMBULATORY_CARE_PROVIDER_SITE_OTHER): Payer: Managed Care, Other (non HMO) | Admitting: Internal Medicine

## 2016-05-07 ENCOUNTER — Telehealth: Payer: Self-pay | Admitting: Endocrinology

## 2016-05-07 ENCOUNTER — Encounter: Payer: Managed Care, Other (non HMO) | Attending: Physician Assistant | Admitting: Physician Assistant

## 2016-05-07 ENCOUNTER — Encounter: Payer: Self-pay | Admitting: Internal Medicine

## 2016-05-07 DIAGNOSIS — E1142 Type 2 diabetes mellitus with diabetic polyneuropathy: Secondary | ICD-10-CM | POA: Insufficient documentation

## 2016-05-07 DIAGNOSIS — L97523 Non-pressure chronic ulcer of other part of left foot with necrosis of muscle: Secondary | ICD-10-CM | POA: Diagnosis not present

## 2016-05-07 DIAGNOSIS — L089 Local infection of the skin and subcutaneous tissue, unspecified: Secondary | ICD-10-CM | POA: Diagnosis not present

## 2016-05-07 DIAGNOSIS — M86672 Other chronic osteomyelitis, left ankle and foot: Secondary | ICD-10-CM | POA: Insufficient documentation

## 2016-05-07 DIAGNOSIS — E1169 Type 2 diabetes mellitus with other specified complication: Secondary | ICD-10-CM | POA: Diagnosis not present

## 2016-05-07 DIAGNOSIS — E11628 Type 2 diabetes mellitus with other skin complications: Secondary | ICD-10-CM

## 2016-05-07 DIAGNOSIS — E11621 Type 2 diabetes mellitus with foot ulcer: Secondary | ICD-10-CM | POA: Insufficient documentation

## 2016-05-07 NOTE — Progress Notes (Signed)
       To Whom it May Concern:  [name] is a patient of mine and will need a pair of diabetic steel toed boots and diabetic inserts.    Sincerely,     Michel Bickers MD

## 2016-05-07 NOTE — Assessment & Plan Note (Signed)
He does not have any clinical evidence or evidence for right MRI of any deep infection. Recent swab cultures of the ulcer grew group A strep, enterococcus and MRSA but these probably represent skin flora. He is improving off of antibiotics. I do not think he has any active infection at this time and would continue observation off of antibiotics and excellent wound care. He can follow-up here as needed.

## 2016-05-07 NOTE — Progress Notes (Signed)
[name]  Patient Active Problem List   Diagnosis Date Noted  . Diabetic infection of left foot ([place]) 01/12/2016    Priority: High  . Surgery, elective   . Hardware complicating wound infection ([place])   . Diabetes mellitus due to underlying condition, uncontrolled, with diabetic neuropathy ([place])   . Metatarsal stress fracture of left foot 01/12/2016  . Diabetes mellitus due to underlying condition with diabetic polyneuropathy ([place]) 01/12/2016  . Diabetic ulcer of foot associated with type 2 diabetes mellitus, with necrosis of muscle ([place]) 01/11/2016    Patient's Medications  New Prescriptions   No medications on file  Previous Medications   ASPIRIN EC 81 MG TABLET    Take 81 mg by mouth daily.   DOCUSATE SODIUM (COLACE) 100 MG CAPSULE    Take 1 capsule (100 mg total) by mouth 2 (two) times daily. While taking narcotic pain medicine.   INSULIN GLARGINE (BASAGLAR KWIKPEN) 100 UNIT/ML SOPN    Inject 0.35 mLs (35 Units total) into the skin at bedtime.   INSULIN LISPRO (HUMALOG KWIKPEN) 100 UNIT/ML KIWKPEN    Inject 0.15 mLs (15 Units total) into the skin 3 (three) times daily with meals. And pen needles 4/day   LOSARTAN-HYDROCHLOROTHIAZIDE (HYZAAR) 100-25 MG TABLET    Take 1 tablet by mouth daily.   METFORMIN (GLUCOPHAGE) 1000 MG TABLET    Take 1,000 mg by mouth 2 (two) times daily.   MULTIVITAMIN-IRON-MINERALS-FOLIC ACID (CENTRUM) CHEWABLE TABLET    Chew 1 tablet by mouth daily.   OXYCODONE (ROXICODONE) 5 MG IMMEDIATE RELEASE TABLET    Take 1-2 tablets (5-10 mg total) by mouth every 4 (four) hours as needed for moderate pain or severe pain.   SENNA (SENOKOT) 8.6 MG TABS TABLET    Take 2 tablets (17.2 mg total) by mouth 2 (two) times daily.   SIMVASTATIN (ZOCOR) 20 MG TABLET    TAKE 1 TABLET BY MOUTH IN THE EVENING   TAMSULOSIN (FLOMAX) 0.4 MG CAPS CAPSULE    Take 0.4 mg by mouth daily.   Modified Medications   No medications on file  Discontinued  Medications   INSULIN ASPART (NOVOLOG FLEXPEN) 100 UNIT/ML FLEXPEN    15 units three times per day.    Subjective: [name] is in for his routine follow-up visit. He is feeling better. He is not having any pain in his left foot. He has not had any fever, chills or sweats. He's had no unusual swelling or redness. He's only had scant drops of blood on the gauze dressing over his lateral left foot ulcer. He was in the wound clinic 1 week ago and is due to go back today. He is not on any antibiotics.  Review of Systems: Review of Systems  Constitutional: Negative for chills, diaphoresis and fever.  Musculoskeletal: Negative for joint pain.  Neurological: Positive for sensory change.    Past Medical History:  Diagnosis Date  . Arthritis   . BPH (benign prostatic hypertrophy)   . Diabetes mellitus   . Diabetic neuropathy ([place])   . Hypertension   . Metatarsal bone fracture    left 5th toe  . Osteomyelitis of toe of right foot ([place])   . Post-operative infection    and diabetic ulcer left foot  . Sleep apnea     does not wear CPAP  . Wears glasses     Social History  Substance Use Topics  . Smoking status: Never Smoker  .  Smokeless tobacco: Never Used  . Alcohol use Yes     Comment: occasional     Family History  Problem Relation Age of Onset  . Diabetes Mother   . Diabetes Other     Allergies  Allergen Reactions  . No Known Allergies     Objective: Vitals:   05/07/16 1038  BP: 135/82  Pulse: 93  Temp: 97.9 F (36.6 C)  TempSrc: Oral  Weight: 261 lb (118.4 kg)  Height: 5\' 10"  (1.778 m)   Body mass index is 37.45 kg/m.  Physical Exam  Constitutional: He is oriented to person, place, and time.  He is in very good spirits today.  Cardiovascular: Normal rate.   No murmur heard. Pulmonary/Chest: Effort normal and breath sounds normal.  Musculoskeletal:  He has a hard, dry callus with superficial ulceration over his left lateral foot. There is no fluctuance,  erythema, drainage or odor.  Neurological: He is alert and oriented to person, place, and time.  Skin: No rash noted.  Psychiatric: Mood and affect normal.    Lab Results  MRI of left foot with and without contrast 05/05/2016  IMPRESSION: Postoperative change of the proximal fifth metatarsal. No soft tissue abscess is identified. Cutaneous and shallow subcutaneous edema and enhancement could be due to the presence of granulation tissue or cellulitis. Mild signal change and enhancement at the fracture site have an appearance most suggestive of postoperative and posttraumatic change rather than osteomyelitis.    By: Inge Rise M.D.   On: 05/06/2016 14:08    Problem List Items Addressed This Visit      High   Diabetic infection of left foot ([place])    He does not have any clinical evidence or evidence for right MRI of any deep infection. Recent swab cultures of the ulcer grew group A strep, enterococcus and MRSA but these probably represent skin flora. He is improving off of antibiotics. I do not think he has any active infection at this time and would continue observation off of antibiotics and excellent wound care. He can follow-up here as needed.          Michel Bickers, MD [facility] [phone] pager   [phone] cell 05/07/2016, 10:58 AM

## 2016-05-07 NOTE — Telephone Encounter (Signed)
I printed letter.

## 2016-05-07 NOTE — Telephone Encounter (Signed)
Pt came by and said that he was needing some steel toe diabetic shoe inserts to be made, however the place that is making them for him needs some type of letter confirming that he is a diabetic patient. He provided me with the name and number of the facility; [facility] [phone]

## 2016-05-07 NOTE — Telephone Encounter (Signed)
See message and please advise, Thanks!  

## 2016-05-08 NOTE — Progress Notes (Addendum)
[name], [name] ([id number]) Visit Report for 05/07/2016 Chief Complaint Document Details Patient Name: [name], [name]. Date of Service: 05/07/2016 1:30 PM Medical Record Number: [id number] Patient Account Number: [id number] Date of Birth/Sex: Feb 15, 1961 (55 y.o. Male) Treating RN: Baruch Gouty, RN, BSN, Velva Harman Primary Care Physician: Gavin Pound Other Clinician: Referring Physician: Gavin Pound Treating Physician/Extender: Melburn Hake, Kamora Vossler Weeks in Treatment: 8 Information Obtained from: Patient Chief Complaint 03/06/16; patient returns today after further surgery on the left foot as well as a 6 week course of IV antibiotics Electronic Signature(s) Signed: 05/08/2016 8:57:57 PM By: Worthy Keeler PA-C Entered By: Worthy Keeler on 05/08/2016 17:16:30 [name] ([id number]) -------------------------------------------------------------------------------- Debridement Details Patient Name: [name]. Date of Service: 05/07/2016 1:30 PM Medical Record Number: [id number] Patient Account Number: [id number] Date of Birth/Sex: [birth year] (55 y.o. Male) Treating RN: Baruch Gouty, RN, BSN, [place] Primary Care Physician: Gavin Pound Other Clinician: Referring Physician: Gavin Pound Treating Physician/Extender: STONE III, Ximenna Fonseca Weeks in Treatment: 8 Debridement Performed for Wound #6 Left,Lateral Foot Assessment: Performed By: Physician STONE III, Jariana Shumard E., PA-C Debridement: Debridement Pre-procedure Yes - 13:59 Verification/Time Out Taken: Start Time: 13:59 Pain Control: Lidocaine 4% Topical Solution Level: Skin/Subcutaneous Tissue Total Area Debrided (L x 1 (cm) x 0.5 (cm) = 0.5 (cm) W): Tissue and other Viable, Non-Viable, Callus, Fibrin/Slough, Subcutaneous material debrided: Instrument: Curette Bleeding: Minimum Hemostasis Achieved: Pressure End Time: 14:02 Procedural Pain: 0 Post Procedural Pain: 0 Response to Treatment: Procedure was tolerated well Post Debridement  Measurements of Total Wound Length: (cm) 1 Width: (cm) 0.5 Depth: (cm) 0.25 Volume: (cm) 0.098 Character of Wound/Ulcer Post Stable Debridement: Severity of Tissue Post Debridement: Fat layer exposed Post Procedure Diagnosis Same as Pre-procedure Electronic Signature(s) Signed: 05/08/2016 5:30:04 PM By: Regan Lemming BSN, RN Signed: 05/08/2016 8:57:57 PM By: Worthy Keeler PA-C Previous Signature: 05/07/2016 4:37:25 PM Version By: Regan Lemming BSN, RN Previous Signature: 05/08/2016 12:52:14 AM Version By: Vic Blackbird, [place] ([id number]) Entered By: Worthy Keeler on 05/08/2016 17:16:07 [name], [name] ([id number]) -------------------------------------------------------------------------------- HPI Details Patient Name: [name]. Date of Service: 05/07/2016 1:30 PM Medical Record Number: [id number] Patient Account Number: [id number] Date of Birth/Sex: 01/16/1961 (55 y.o. Male) Treating RN: Baruch Gouty, RN, BSN, Velva Harman Primary Care Physician: Gavin Pound Other Clinician: Referring Physician: Gavin Pound Treating Physician/Extender: STONE III, Azaliah Carrero Weeks in Treatment: 8 History of Present Illness HPI Description: Pleasant 55 year old with history of diabetes (Hgb A1c 10.8 in 2014) and peripheral neuropathy. No PVD. L ABI 1.1. Status post right great toe partial amputation years ago. He was at work and on 10/22/2014, was injured by a cart, and suffered an ulceration to his left anterior calf. He says that it subsequently became infected, and he was treated with a course of antibiotics. He was found on initial exam to have an ulceration on the dorsum of his left third toe. He was unaware of this and attributes it to pressure from his steel toed boots. More recently he injured his right anterior calf on a cart. Ambulating normally per his baseline. He has been undergoing regular debridements, applying mupirocin cream, and an Ace wrap for edema control. He returns to  clinic for follow-up and is without complaints. No pain. No fever or chills. No drainage. 10/25/15; this is a 55 year old man who has type II diabetes with diabetic polyneuropathy. He tells me that he fractured his left fifth metatarsal in June 2016 when he presented with swelling. He does not recall a specific injury.  His hemoglobin A1c was apparently too high at the time for consideration of surgery and he was put in some form of offloading. Ultimately he went to surgery in December with an allograft from his calcaneus to this site, plate and screws. He had an x-ray of the foot in March that showed concerns about nonunion. He tells me that in March he had to move and basically moved himself. He was on his foot a lot and then noticed some drainage from an open area. He has been following with his orthopedic surgeon Dr. Doran Durand. He has been applying a felt donut, dry dressing and using his heel healing sandal. 11/01/15; this is a patient I saw last week for the first time. He had a small open wound on the plantar aspect of his left foot at roughly the level of the base of his fifth metatarsal. He had a considerable degree of thickened skin around this wound on the plantar aspect which I thought was from chronic pressure on this area. He tells [place] that he had drainage over the course of the week. No systemic symptoms. 11/08/15; culture last week grew Citrobacter korseri. This should've been sensitive to the Augmentin I gave him. He has seen Dr. Doran Durand who did his initial surgery and according the patient the plan is to give this another month and then the hardware might need to come out of this. This seems like a reasonable plan. I will adjust his antibiotics to ciprofloxacin which probably should continue for at least another 2 weeks. I gave him 10 days worth today 11/22/15 the patient has completed antibiotics. He has an appointment with Dr. Doran Durand this Friday. There is improved dimensions around the  wound on the left fifth metatarsal base 11/29/15; the patient has completed antibiotics last week. Apparently his appointment with Dr. Doran Durand it is not until this Friday. Dimensions are roughly the same. 12/06/15; saw Dr. Doran Durand. No x-ray told the end of the month, next appointment June 30. We have been using Aquacel Ag 12/13/15: No major change this week. Using Aquacel AG 621/17; arrived this week with maceration around the wound. There was quite a bit of undermining which required surgical debridement. I changed him to [facility] last week, by the patient's admission he was up on this more this week [name], [name]. ([id number]) 12/27/15; macerated tissue around the wound is removed with a scalpel and pickups. There is no undermining. Nonviable subcutaneous tissue and skin taken from the superior circumference of the wound is slough from the surface. READMISSION 03/06/16 since I last saw this patient at the end of June, he went for surgery on 01/11/16 by Dr. Doran Durand of orthopedics. He had a left foot irrigation and debridement, removal of hardware and placement of wound VAC. He is also been followed by Dr. Megan Salon of infectious disease and completed a six-week course of IV Rocephin for group B strep and Enterobacter in the bone at the time of surgery. Apparently at the time of surgery the bone looked healthy so I don't think any bone was actually removed. He has been using silver alginate based dressings on the same wound area at the base of the left fifth metatarsal on the left. I note that he is also had arterial studies on 01/08/16, these showed a left ABI of 1.2 to and a right ABI of 1.3. Waveforms were listed as biphasic. He was not felt to have any specific arterial issues. 03/13/16; no real change in the condition of the wound  at the left lateral foot at roughly the base of his fifth metatarsal. Use silver alginate last week. 03/18/16 arrives today with no open area. Being suspicious of the  overlying callus I. Some of this back although I see nothing but covering tissue here/epithelium. There is no surrounding tenderness READMISSION 04/16/16 this is a patient I discharged about a month ago. Initially a surgical wound on the lateral aspect of his left foot which subsequently became infected. The story he is giving today that he went back into his own modified shoe started to notice pain 2 weeks ago he was seen in Dr. Nona Dell office by a physician assistant last Wednesday and according the patient was told that everything looked fine however this is clearly now broken open and he has an open wound in the same spot that we have been dealing with repetitively. Situation is complicated by the fact that he is running short of money on long-term disability. He has not taken his insulin and at least 2 weeks was previously on NovoLog short acting insulin on a sliding scale and TRESIBA 35 units at bedtime. He is no longer able to afford any of his medication he was in the x-ray on 10/15. A plain x-ray showed healed fracture of the left fifth metatarsal bone status post removal of the associated plate and screw fixation hardware. There was no acute appearing osseous abnormality. His blood work showed a white count of 9.2 with an essentially normal differential comprehensive metabolic panel was normal. Previous CT scan of the foot on 01/08/16 showed no osteomyelitis previous vascular workup showed no evidence of significant PAD on 01/13/16 04/23/16; culture I did last week grew enterococcus [ampicillin sensitive] and MRSA. He saw infectious disease yesterday. They stop the clindamycin and ordered an MRI. This is not unreasonable. All the hardware is out of the foot at this point. 04/30/16 at this point in time we are still awaiting the results of the MRI at this point in time. Patient did have an area which appears to be somewhat macerated in the proximal portion of the wound where there  is overlying necrotic skin that is doing nothing more than trapping fluid underneath. He continues to state that he does have some discomfort and again the concern is for the possibility of osteomyelitis hence the reason for the MRI order. We have been using a silver alginate dressing but again I think the reason this with his macerated as it was is that the dressing obscene could not reach the entirety of the wound due to some necrotic skin covering the proximal portion. No bleeding noted at this point in time on initial evaluation. 05/07/16 we receive the results of patient's MRI which shows that he has no evidence of osteomyelitis. This obviously is excellent news. Patient was definitely happy to hear this. He tells me the wound appears to be doing very well at this point in time and is pleased with the progress currently. Electronic Signature(s) ZOHAIB, CRAFT ([id number]) Signed: 05/08/2016 8:57:57 PM By: Worthy Keeler PA-C Entered By: Worthy Keeler on 05/08/2016 17:17:27 [name], [name] ([id number]) -------------------------------------------------------------------------------- Physical Exam Details Patient Name: [name], [name]. Date of Service: 05/07/2016 1:30 PM Medical Record Number: [id number] Patient Account Number: [id number] Date of Birth/Sex: September 04, 1960 (55 y.o. Male) Treating RN: Baruch Gouty, RN, BSN, Velva Harman Primary Care Physician: Gavin Pound Other Clinician: Referring Physician: Gavin Pound Treating Physician/Extender: STONE III, Moshe Wenger Weeks in Treatment: 8 Constitutional Well-nourished and well-hydrated in no acute distress. Respiratory normal  breathing without difficulty. Psychiatric this patient is able to make decisions and demonstrates good insight into disease process. Alert and Oriented x 3. pleasant and cooperative. Notes Patient measuring smaller he does have surface slough noted and some callus which also needs to be debrided at this point in time today. He  tolerated this without complication. No evidence of surrounding infection noted at this point in time. Electronic Signature(s) Signed: 05/08/2016 8:57:57 PM By: Worthy Keeler PA-C Entered By: Worthy Keeler on 05/08/2016 17:18:12 [name] ([id number]) -------------------------------------------------------------------------------- Physician Orders Details Patient Name: HATEM, PARLAPIANO. Date of Service: 05/07/2016 1:30 PM Medical Record Number: [id number] Patient Account Number: [id number] Date of Birth/Sex: [birth year] (55 y.o. Male) Treating RN: Baruch Gouty, RN, BSN, Velva Harman Primary Care Physician: Gavin Pound Other Clinician: Referring Physician: Gavin Pound Treating Physician/Extender: Melburn Hake, Verlee Pope Weeks in Treatment: 8 Verbal / Phone Orders: Yes Clinician: Afful, RN, BSN, Rita Read Back and Verified: Yes Diagnosis Coding Wound Cleansing Wound #6 Left,Lateral Foot o Cleanse wound with mild soap and water o May Shower, gently pat wound dry prior to applying new dressing. o May shower with protection. Anesthetic Wound #6 Left,Lateral Foot o Topical Lidocaine 4% cream applied to wound bed prior to debridement Primary Wound Dressing Wound #6 Left,Lateral Foot o Sorbalgon Ag Secondary Dressing Wound #6 Left,Lateral Foot o Gauze and Kerlix/Conform Dressing Change Frequency Wound #6 Left,Lateral Foot o Change dressing every day. Follow-up Appointments Wound #6 Left,Lateral Foot o Return Appointment in 1 week. Off-Loading Wound #6 Left,Lateral Foot o Open toe surgical shoe with peg assist. Additional Orders / Instructions Wound #6 Left,Lateral Foot o Increase protein intake. o Activity as tolerated [name], [name] ([id number]) Electronic Signature(s) Signed: 05/07/2016 4:37:25 PM By: Regan Lemming BSN, RN Signed: 05/08/2016 12:52:14 AM By: Worthy Keeler PA-C Entered By: Regan Lemming on 05/07/2016 14:03:39 [name]  ([id number]) -------------------------------------------------------------------------------- Problem List Details Patient Name: [name], [name]. Date of Service: 05/07/2016 1:30 PM Medical Record Number: [id number] Patient Account Number: [id number] Date of Birth/Sex: [birth year] (55 y.o. 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 Date of Service: 05/07/2016 1:30 PM Medical Record Number: [id number] Patient Account Number: [id number] Date of Birth/Sex: Sep 01, 1960 (55 y.o. Male) Treating RN: Baruch Gouty, RN, BSN, Velva Harman Primary Care Physician: Gavin Pound Other Clinician: Referring Physician: Gavin Pound Treating Physician/Extender: Melburn Hake, Cydni Reddoch Weeks in Treatment: 8 Subjective Chief Complaint Information obtained from Patient 03/06/16; patient returns today after further surgery on the left foot as well as a 6 week course of IV antibiotics History of Present Illness (HPI) Pleasant 54 year old with history of diabetes (Hgb A1c 10.8 in 2014) and peripheral neuropathy. No PVD. L ABI 1.1. Status post right great toe partial amputation years ago. He was at work and on 10/22/2014, was injured by a cart, and suffered  an ulceration to his left anterior calf. He says that it subsequently became infected, and he was treated with  a course of antibiotics. He was found on initial exam to have an ulceration on the dorsum of his left third toe. He was unaware of this and attributes it to pressure from his steel toed boots. More recently he injured his right anterior calf on a cart. Ambulating normally per his baseline. He has been undergoing regular debridements, applying mupirocin cream, and an Ace wrap for edema control. He returns to clinic for follow-up and is without complaints. No pain. No fever or chills. No drainage. 10/25/15; this is a 55 year old man who has type II diabetes with diabetic polyneuropathy. He tells me that he fractured his left fifth metatarsal in June 2016 when he presented with swelling. He does not recall a specific injury. His hemoglobin A1c was apparently too high at the time for consideration of surgery and he was put in some form of offloading. Ultimately he went to surgery in December with an allograft from his calcaneus to this site, plate and screws. He had an x-ray of the foot in March that showed concerns about nonunion. He tells me that in March he had to move and basically moved himself. He was on his foot a lot and then noticed some drainage from an open area. He has been following with his orthopedic surgeon Dr. Doran Durand. He has been applying a felt donut, dry dressing and using his heel healing sandal. 11/01/15; this is a patient I saw last week for the first time. He had a small open wound on the plantar aspect of his left foot at roughly the level of the base of his fifth metatarsal. He had a considerable degree of thickened skin around this wound on the plantar aspect which I thought was from chronic pressure on this area. He tells [place] that he had drainage over the course of the week. No systemic symptoms. 11/08/15; culture last week grew Citrobacter korseri. This should've been  sensitive to the Augmentin I gave him. He has seen Dr. Doran Durand who did his initial surgery and according the patient the plan is to give this another month and then the hardware might need to come out of this. This seems like a reasonable plan. I will adjust his antibiotics to ciprofloxacin which probably should continue for at least another 2 weeks. I gave him 10 days worth today 11/22/15 the patient has completed antibiotics. He has an appointment with Dr. Doran Durand this Friday. There is improved dimensions around the wound on the left fifth metatarsal base 11/29/15; the patient has completed antibiotics last week. Apparently his appointment with Dr. Doran Durand it is not until this Friday. Dimensions are roughly the same. [name], [name] ([id number]) 12/06/15; saw Dr. Doran Durand. No x-ray told the end of the month, next appointment June 30. We have been using Aquacel Ag 12/13/15: No major change this week. Using Aquacel AG 621/17; arrived this week with maceration around the wound. There was quite a bit of undermining which required surgical debridement. I changed him to [facility] last week, by the patient's admission he was up on this more this week 12/27/15; macerated tissue around the wound is removed with a scalpel and pickups. There is no undermining. Nonviable subcutaneous tissue and skin taken from the superior circumference of the wound is slough from the surface. READMISSION 03/06/16 since I last saw this patient at the end of June, he went for surgery on 01/11/16 by Dr. Doran Durand of orthopedics. He had a left foot irrigation and debridement, removal of hardware and placement of wound VAC.  He is also been followed by Dr. Megan Salon of infectious disease and completed a six-week course of IV Rocephin for group B strep and Enterobacter in the bone at the time of surgery. Apparently at the time of surgery the bone looked healthy so I don't think any bone was actually removed. He has been using  silver alginate based dressings on the same wound area at the base of the left fifth metatarsal on the left. I note that he is also had arterial studies on 01/08/16, these showed a left ABI of 1.2 to and a right ABI of 1.3. Waveforms were listed as biphasic. He was not felt to have any specific arterial issues. 03/13/16; no real change in the condition of the wound at the left lateral foot at roughly the base of his fifth metatarsal. Use silver alginate last week. 03/18/16 arrives today with no open area. Being suspicious of the overlying callus I. Some of this back although I see nothing but covering tissue here/epithelium. There is no surrounding tenderness READMISSION 04/16/16 this is a patient I discharged about a month ago. Initially a surgical wound on the lateral aspect of his left foot which subsequently became infected. The story he is giving today that he went back into his own modified shoe started to notice pain 2 weeks ago he was seen in Dr. Nona Dell office by a physician assistant last Wednesday and according the patient was told that everything looked fine however this is clearly now broken open and he has an open wound in the same spot that we have been dealing with repetitively. Situation is complicated by the fact that he is running short of money on long-term disability. He has not taken his insulin and at least 2 weeks was previously on NovoLog short acting insulin on a sliding scale and TRESIBA 35 units at bedtime. He is no longer able to afford any of his medication he was in the x-ray on 10/15. A plain x-ray showed healed fracture of the left fifth metatarsal bone status post removal of the associated plate and screw fixation hardware. There was no acute appearing osseous abnormality. His blood work showed a white count of 9.2 with an essentially normal differential comprehensive metabolic panel was normal. Previous CT scan of the foot on 01/08/16 showed no osteomyelitis  previous vascular workup showed no evidence of significant PAD on 01/13/16 04/23/16; culture I did last week grew enterococcus [ampicillin sensitive] and MRSA. He saw infectious disease yesterday. They stop the clindamycin and ordered an MRI. This is not unreasonable. All the hardware is out of the foot at this point. 04/30/16 at this point in time we are still awaiting the results of the MRI at this point in time. Patient did have an area which appears to be somewhat macerated in the proximal portion of the wound where there is overlying necrotic skin that is doing nothing more than trapping fluid underneath. He continues to state that he does have some discomfort and again the concern is for the possibility of osteomyelitis hence the reason for the MRI order. We have been using a silver alginate dressing but again I think the reason this with his macerated as it was is that the dressing obscene could not reach the entirety of the wound due to some necrotic skin covering the proximal portion. No bleeding noted at this point in time on initial evaluation. [name], [name] ([id number]) 05/07/16 we receive the results of patient's MRI which shows that he has no evidence  of osteomyelitis. This obviously is excellent news. Patient was definitely happy to hear this. He tells me the wound appears to be doing very well at this point in time and is pleased with the progress currently. Objective Constitutional Well-nourished and well-hydrated in no acute distress. Vitals Time Taken: 1:43 PM, Height: 70 in, Weight: 260 lbs, BMI: 37.3, Temperature: 98.1 F, Pulse: 104 bpm, Respiratory Rate: 17 breaths/min, Blood Pressure: 131/73 mmHg. Respiratory normal breathing without difficulty. Psychiatric this patient is able to make decisions and demonstrates good insight into disease process. Alert and Oriented x 3. pleasant and cooperative. General Notes: Patient measuring smaller he does have surface slough  noted and some callus which also needs to be debrided at this point in time today. He tolerated this without complication. No evidence of surrounding infection noted at this point in time. Integumentary (Hair, Skin) Wound #6 status is Open. Original cause of wound was Gradually Appeared. The wound is located on the Left,Lateral Foot. The wound measures 1cm length x 0.5cm width x 0.2cm depth; 0.393cm^2 area and 0.079cm^3 volume. The wound is limited to skin breakdown. There is no tunneling or undermining noted. There is a large amount of serosanguineous drainage noted. The wound margin is thickened. There is large (67-100%) pink, pale granulation within the wound bed. There is no necrotic tissue within the wound bed. The periwound skin appearance exhibited: Callus, Moist. The periwound skin appearance did not exhibit: Crepitus, Excoriation, Fluctuance, Friable, Induration, Localized Edema, Rash, Scarring, Dry/Scaly, Maceration, Atrophie Blanche, Cyanosis, Ecchymosis, Hemosiderin Staining, Mottled, Pallor, Rubor, Erythema. Periwound temperature was noted as No Abnormality. The periwound has tenderness on palpation. 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 There was a Skin/Subcutaneous Tissue Debridement [id number]) debridement with total area of 0.5 sq cm performed by STONE III, Lisanne Ponce E., PA-C. with the following instrument(s): Curette to remove Viable and Non-Viable tissue/material including Fibrin/Slough, Callus, and Subcutaneous after achieving pain control using Lidocaine 4% Topical Solution. A time out was conducted at 13:59, prior to the start of the  procedure. A Minimum amount of bleeding was controlled with Pressure. The procedure was tolerated well with a pain level of 0 throughout and a pain level of 0 following the procedure. Post Debridement Measurements: 1cm length x 0.5cm width x 0.25cm depth; 0.098cm^3 volume. Character of Wound/Ulcer Post Debridement is stable. Severity of Tissue Post Debridement is: Fat layer exposed. Post procedure Diagnosis Wound #6: Same as Pre-Procedure Plan Wound Cleansing: Wound #6 Left,Lateral Foot: Cleanse wound with mild soap and water May Shower, gently pat wound dry prior to applying new dressing. May shower with protection. Anesthetic: Wound #6 Left,Lateral Foot: Topical Lidocaine 4% cream applied to wound bed prior to debridement Primary Wound Dressing: Wound #6 Left,Lateral Foot: Sorbalgon Ag Secondary Dressing: Wound #6 Left,Lateral Foot: Gauze and Kerlix/Conform Dressing Change Frequency: Wound #6 Left,Lateral Foot: Change dressing every day. Follow-up Appointments: Wound #6 Left,Lateral Foot: MATAS, LAURENZI ([id number]) Return Appointment in 1 week. Off-Loading: Wound #6 Left,Lateral Foot: Open toe surgical shoe with peg assist. Additional Orders / Instructions: Wound #6 Left,Lateral Foot: Increase protein intake. Activity as tolerated Follow-Up Appointments: A Patient Clinical Summary of Care was provided to HP Currently I''m pleased with how  this wound has progressed. It is excellent news that he has no osteomyelitis. Currently we are going to see him back for a follow-up visit in one week and I'm going to continue with the silver alginate at this point in time. I will also go ahead and recommend that he continue with the open toe surgical shoe with Peg assist. QUESTIONS were encouraged and answered to the best of my ability today and we will see him for reevaluation in 1 week. Electronic Signature(s) Signed: 05/08/2016 8:57:57 PM By: Worthy Keeler PA-C Entered By:  Worthy Keeler on 05/08/2016 17:19:29 [name] ([id number]) -------------------------------------------------------------------------------- SuperBill Details Patient Name: [name]. Date of Service: 05/07/2016 Medical Record Number: [id number] Patient Account Number: [id number] Date of Birth/Sex: 1960-09-04 (55 y.o. Male) Treating RN: Baruch Gouty, RN, BSN, Velva Harman Primary Care Physician: Gavin Pound Other Clinician: Referring Physician: Gavin Pound Treating Physician/Extender: Melburn Hake, Adaijah Endres Weeks in Treatment: 8 Diagnosis Coding ICD-10 Codes Code Description E11.621 Type 2 diabetes mellitus with foot ulcer L97.523 Non-pressure chronic ulcer of other part of left foot with necrosis of muscle M86.672 Other chronic osteomyelitis, left ankle and foot Facility Procedures CPT4 Code Description: [id number] - DEB SUBQ TISSUE 20 SQ CM/< ICD-10 Description Diagnosis L97.523 Non-pressure chronic ulcer of other part of left foo Modifier: t with necrosi Quantity: 1 s of muscle Physician Procedures CPT4 Code Description: [id number] - WC PHYS SUBQ TISS 20 SQ CM ICD-10 Description Diagnosis L97.523 Non-pressure chronic ulcer of other part of left foo Modifier: t with necrosis Quantity: 1 of muscle Electronic Signature(s) Signed: 05/08/2016 12:52:14 AM By: Worthy Keeler PA-C Entered By: Worthy Keeler on 05/07/2016 23:52:56

## 2016-05-08 NOTE — Telephone Encounter (Signed)
I contacted the patient and advised we have submitted the letter to [facility]. Patient advised to call back if he had any further questions.

## 2016-05-08 NOTE — Progress Notes (Addendum)
[name], [name] ([id number]) Visit Report for 05/07/2016 Arrival Information Details Patient Name: [name], [name]. Date of Service: 05/07/2016 1:30 PM Medical Record Number: [id number] Patient Account Number: [id number] Date of Birth/Sex: 07-Jan-1961 (55 y.o. Male) Treating RN: Baruch Gouty, RN, BSN, Velva Harman Primary Care Physician: Gavin Pound Other Clinician: Referring Physician: Gavin Pound Treating Physician/Extender: STONE III, HOYT Weeks in Treatment: 8 Visit Information History Since Last Visit All ordered tests and consults were completed: No Patient Arrived: Ambulatory Added or deleted any medications: No Arrival Time: 13:42 Any new allergies or adverse reactions: No Accompanied By: self Had a fall or experienced change in No Transfer Assistance: None activities of daily living that may affect Patient Identification Verified: Yes risk of falls: Secondary Verification Process Yes Signs or symptoms of abuse/neglect since last No Completed: visito Patient Requires Transmission-Based No Hospitalized since last visit: No Precautions: Has Dressing in Place as Prescribed: Yes Patient Has Alerts: No Has Footwear/Offloading in Place as Yes Prescribed: Left: Wedge Shoe Pain Present Now: No Electronic Signature(s) Signed: 05/07/2016 4:37:25 PM By: Regan Lemming BSN, RN Entered By: Regan Lemming on 05/07/2016 13:43:43 [name] ([id number]) -------------------------------------------------------------------------------- Encounter Discharge Information Details Patient Name: [name]. Date of Service: 05/07/2016 1:30 PM Medical Record Number: [id number] Patient Account Number: [id number] Date of Birth/Sex: 1960-12-09 (55 y.o. Male) Treating RN: Baruch Gouty, RN, BSN, Velva Harman Primary Care Physician: Gavin Pound Other Clinician: Referring Physician: Gavin Pound Treating Physician/Extender: Melburn Hake, HOYT Weeks in Treatment: 8 Encounter Discharge Information Items Discharge Pain  Level: 0 Discharge Condition: Stable Ambulatory Status: Ambulatory Discharge Destination: Home Transportation: Private Auto Accompanied By: SELF Schedule Follow-up Appointment: No Medication Reconciliation completed and provided to Patient/Care No Trinnity Breunig: Provided on Clinical Summary of Care: 05/07/2016 Form Type Recipient Paper Patient HP Electronic Signature(s) Signed: 05/07/2016 4:37:25 PM By: Regan Lemming BSN, RN Previous Signature: 05/07/2016 2:08:50 PM Version By: Ruthine Dose Entered By: Regan Lemming on 05/07/2016 14:11:18 [name] ([id number]) -------------------------------------------------------------------------------- Lower Extremity Assessment Details Patient Name: [name]. Date of Service: 05/07/2016 1:30 PM Medical Record Number: [id number] Patient Account Number: [id number] Date of Birth/Sex: 1960/10/22 (55 y.o. Male) Treating RN: Baruch Gouty, RN, BSN, Velva Harman Primary Care Physician: Gavin Pound Other Clinician: Referring Physician: Gavin Pound Treating Physician/Extender: STONE III, HOYT Weeks in Treatment: 8 Vascular Assessment Pulses: Posterior Tibial Dorsalis Pedis Palpable: [Left:Yes] Extremity colors, hair growth, and conditions: Extremity Color: [Left:Normal] Hair Growth on Extremity: [Left:No] Temperature of Extremity: [Left:Warm] Capillary Refill: [Left:< 3 seconds] Toe Nail Assessment Left: Right: Thick: Yes Discolored: Yes Deformed: No Improper Length and Hygiene: No Electronic Signature(s) Signed: 05/07/2016 4:37:25 PM By: Regan Lemming BSN, RN Entered By: Regan Lemming on 05/07/2016 13:44:30 [name] ([id number]) -------------------------------------------------------------------------------- Multi Wound Chart Details Patient Name: [name]. Date of Service: 05/07/2016 1:30 PM Medical Record Number: [id number] Patient Account Number: [id number] Date of Birth/Sex: 02-04-1961 (55 y.o. Male) Treating RN: Baruch Gouty, RN, BSN,  Velva Harman Primary Care Physician: Gavin Pound Other Clinician: Referring Physician: Gavin Pound Treating Physician/Extender: STONE III, HOYT Weeks in Treatment: 8 Vital Signs Height(in): 70 Pulse(bpm): 104 Weight(lbs): 260 Blood Pressure 131/73 (mmHg): Body Mass Index(BMI): 37 Temperature(F): 98.1 Respiratory Rate 17 (breaths/min): Photos: [6:No Photos] [N/A:N/A] Wound Location: [6:Left Foot - Lateral] [N/A:N/A] Wounding Event: [6:Gradually Appeared] [N/A:N/A] Primary Etiology: [6:Diabetic Wound/Ulcer of the Lower Extremity] [N/A:N/A] Comorbid History: [6:Hypertension, Type II Diabetes, Neuropathy] [N/A:N/A] Date Acquired: [6:03/25/2016] [N/A:N/A] Weeks of Treatment: [6:3] [N/A:N/A] Wound Status: [6:Open] [N/A:N/A] Measurements L x W x D 1x0.5x0.2 [N/A:N/A] (cm) Area (cm) : [6:0.393] [N/A:N/A]  Volume (cm) : [6:0.079] [N/A:N/A] % Reduction in Area: [6:91.70%] [N/A:N/A] % Reduction in Volume: 91.60% [N/A:N/A] Classification: [6:Grade 1] [N/A:N/A] Exudate Amount: [6:Large] [N/A:N/A] Exudate Type: [6:Serosanguineous] [N/A:N/A] Exudate Color: [6:red, brown] [N/A:N/A] Wound Margin: [6:Thickened] [N/A:N/A] Granulation Amount: [6:Large (67-100%)] [N/A:N/A] Granulation Quality: [6:Pink, Pale] [N/A:N/A] Necrotic Amount: [6:None Present (0%)] [N/A:N/A] Exposed Structures: [6:Fascia: No Fat: No Tendon: No Muscle: No Joint: No Bone: No] [N/A:N/A] Limited to Skin Breakdown Epithelialization: Small (1-33%) N/A N/A Periwound Skin Texture: Callus: Yes N/A N/A Edema: No Excoriation: No Induration: No Crepitus: No Fluctuance: No Friable: No Rash: No Scarring: No Periwound Skin Moist: Yes N/A N/A Moisture: Maceration: No Dry/Scaly: No Periwound Skin Color: Atrophie Blanche: No N/A N/A Cyanosis: No Ecchymosis: No Erythema: No Hemosiderin Staining: No Mottled: No Pallor: No Rubor: No Temperature: No Abnormality N/A N/A Tenderness on Yes N/A N/A Palpation: Wound  Preparation: Ulcer Cleansing: N/A N/A Rinsed/Irrigated with Saline Topical Anesthetic Applied: Other: Lidocaine 4% Treatment Notes Electronic Signature(s) Signed: 05/07/2016 4:37:25 PM By: Regan Lemming BSN, RN Entered By: Regan Lemming on 05/07/2016 13:48:55 [name] ([id number]) -------------------------------------------------------------------------------- [place] Details Patient Name: [name], [name]. Date of Service: 05/07/2016 1:30 PM Medical Record Number: [id number] Patient Account Number: [id number] Date of Birth/Sex: 04/14/1961 (55 y.o. Male) Treating RN: Baruch Gouty, RN, BSN, Velva Harman Primary Care Physician: Gavin Pound Other Clinician: Referring Physician: Gavin Pound Treating Physician/Extender: STONE III, HOYT Weeks in Treatment: 8 Active Inactive Orientation to the Wound Care Program Nursing Diagnoses: Knowledge deficit related to the wound healing center program Goals: Patient/caregiver will verbalize understanding of the [facility] Date Initiated: 04/16/2016 Goal Status: Active Interventions: Provide education on orientation to the wound center Notes: Wound/Skin Impairment Nursing Diagnoses: Impaired tissue integrity Knowledge deficit related to ulceration/compromised skin integrity Goals: Patient/caregiver will verbalize understanding of skin care regimen Date Initiated: 04/16/2016 Goal Status: Active Ulcer/skin breakdown will have a volume reduction of 30% by week 4 Date Initiated: 04/16/2016 Goal Status: Active Ulcer/skin breakdown will have a volume reduction of 50% by week 8 Date Initiated: 04/16/2016 Goal Status: Active Ulcer/skin breakdown will have a volume reduction of 80% by week 12 Date Initiated: 04/16/2016 Goal Status: Active Ulcer/skin breakdown will heal within 14 weeks Date Initiated: 04/16/2016 [name], [name] ([id number]) Goal Status: Active Interventions: Assess patient/caregiver ability to  obtain necessary supplies Assess patient/caregiver ability to perform ulcer/skin care regimen upon admission and as needed Assess ulceration(s) every visit Provide education on ulcer and skin care Treatment Activities: Skin care regimen initiated : 03/06/2016 Topical wound management initiated : 03/06/2016 Notes: Electronic Signature(s) Signed: 05/07/2016 4:37:25 PM By: Regan Lemming BSN, RN Entered By: Regan Lemming on 05/07/2016 13:48:48 [name] ([id number]) -------------------------------------------------------------------------------- Pain Assessment Details Patient Name: [name]. Date of Service: 05/07/2016 1:30 PM Medical Record Number: [id number] Patient Account Number: [id number] Date of Birth/Sex: 14-Sep-1960 (55 y.o. Male) Treating RN: Baruch Gouty, RN, BSN, Velva Harman Primary Care Physician: Gavin Pound Other Clinician: Referring Physician: Gavin Pound Treating Physician/Extender: STONE III, HOYT Weeks in Treatment: 8 Active Problems Location of Pain Severity and Description of Pain Patient Has Paino No Site Locations With Dressing Change: No Pain Management and Medication Current Pain Management: Electronic Signature(s) Signed: 05/07/2016 4:37:25 PM By: Regan Lemming BSN, RN Entered By: Regan Lemming on 05/07/2016 13:43:52 [name] ([id number]) -------------------------------------------------------------------------------- Patient/Caregiver Education Details Patient Name: [name]. Date of Service: 05/07/2016 1:30 PM Medical Record Number: [id number] Patient Account Number: [id number] Date of Birth/Gender: Dec 09, 1960 (55 y.o. Male) Treating RN: Baruch Gouty, RN, BSN, [employer]  Primary Care Physician: Gavin Pound Other Clinician: Referring Physician: Gavin Pound Treating Physician/Extender: Melburn Hake, HOYT Weeks in Treatment: 8 Education Assessment Education Provided To: Patient Education Topics Provided Welcome To [facility]: Methods:  Explain/Verbal Responses: State content correctly Wound/Skin Impairment: Methods: Explain/Verbal Responses: State content correctly Electronic Signature(s) Signed: 05/07/2016 4:37:25 PM By: Regan Lemming BSN, RN Entered By: Regan Lemming on 05/07/2016 14:11:31 [name] ([id number]) -------------------------------------------------------------------------------- Wound Assessment Details Patient Name: [name]. Date of Service: 05/07/2016 1:30 PM Medical Record Number: [id number] Patient Account Number: [id number] Date of Birth/Sex: 09/27/1960 (55 y.o. Male) Treating RN: Afful, RN, BSN, [place] Primary Care Physician: Gavin Pound Other Clinician: Referring Physician: Gavin Pound Treating Physician/Extender: STONE III, HOYT Weeks in Treatment: 8 Wound Status Wound Number: 6 Primary Diabetic Wound/Ulcer of the Lower Etiology: Extremity Wound Location: Left Foot - Lateral Wound Status: Open Wounding Event: Gradually Appeared Comorbid Hypertension, Type II Diabetes, Date Acquired: 03/25/2016 History: Neuropathy Weeks Of Treatment: 3 Clustered Wound: No Photos Photo Uploaded By: Regan Lemming on 05/07/2016 16:31:17 Wound Measurements Length: (cm) 1 Width: (cm) 0.5 Depth: (cm) 0.2 Area: (cm) 0.393 Volume: (cm) 0.079 % Reduction in Area: 91.7% % Reduction in Volume: 91.6% Epithelialization: Small (1-33%) Tunneling: No Undermining: No Wound Description Classification: Grade 1 Wound Margin: Thickened Exudate Amount: Large Exudate Type: Serosanguineous Exudate Color: red, brown [name], [name]. ([id number]) Foul Odor After Cleansing: No Wound Bed Granulation Amount: Large (67-100%) Exposed Structure Granulation Quality: Pink, Pale Fascia Exposed: No Necrotic Amount: None Present (0%) Fat Layer Exposed: No Tendon Exposed: No Muscle Exposed: No Joint Exposed: No Bone Exposed: No Limited to Skin Breakdown Periwound Skin Texture Texture Color No Abnormalities  Noted: No No Abnormalities Noted: No Callus: Yes Atrophie Blanche: No Crepitus: No Cyanosis: No Excoriation: No Ecchymosis: No Fluctuance: No Erythema: No Friable: No Hemosiderin Staining: No Induration: No Mottled: No Localized Edema: No Pallor: No Rash: No Rubor: No Scarring: No Temperature / Pain Moisture Temperature: No Abnormality No Abnormalities Noted: No Tenderness on Palpation: Yes Dry / Scaly: No Maceration: No Moist: Yes Wound Preparation Ulcer Cleansing: Rinsed/Irrigated with Saline Topical Anesthetic Applied: Other: Lidocaine 4%, Treatment Notes Wound #6 (Left, Lateral Foot) 1. Cleansed with: Clean wound with Normal Saline 4. Dressing Applied: Aquacel Ag 5. Secondary Dressing Applied Gauze and Kerlix/Conform 6. Footwear/Offloading device applied Other footwear/offloading device applied (specify in notes) 7. Secured with Tape Notes [name], [name] ([id number]) [place] Electronic Signature(s) Signed: 05/07/2016 4:37:25 PM By: Regan Lemming BSN, RN Entered By: Regan Lemming on 05/07/2016 13:48:41 [name] ([id number]) -------------------------------------------------------------------------------- Vitals Details Patient Name: [name]. Date of Service: 05/07/2016 1:30 PM Medical Record Number: [id number] Patient Account Number: [id number] Date of Birth/Sex: [birth year] (55 y.o. Male) Treating RN: Afful, RN, BSN, [place] Primary Care Physician: Gavin Pound Other Clinician: Referring Physician: Gavin Pound Treating Physician/Extender: STONE III, HOYT Weeks in Treatment: 8 Vital Signs Time Taken: 13:43 Temperature (F): 98.1 Height (in): 70 Pulse (bpm): 104 Weight (lbs): 260 Respiratory Rate (breaths/min): 17 Body Mass Index (BMI): 37.3 Blood Pressure (mmHg): 131/73 Reference Range: 80 - 120 mg / dl Electronic Signature(s) Signed: 05/07/2016 4:37:25 PM By: Regan Lemming BSN, RN Entered By: Regan Lemming on 05/07/2016 13:45:05

## 2016-05-10 ENCOUNTER — Other Ambulatory Visit: Payer: Self-pay | Admitting: Endocrinology

## 2016-05-14 ENCOUNTER — Ambulatory Visit: Payer: Managed Care, Other (non HMO) | Admitting: Internal Medicine

## 2016-05-15 ENCOUNTER — Encounter: Payer: Managed Care, Other (non HMO) | Admitting: Internal Medicine

## 2016-05-15 DIAGNOSIS — E11621 Type 2 diabetes mellitus with foot ulcer: Secondary | ICD-10-CM | POA: Diagnosis not present

## 2016-05-16 ENCOUNTER — Other Ambulatory Visit: Payer: Self-pay | Admitting: Endocrinology

## 2016-05-17 NOTE — Progress Notes (Signed)
[name], [name] ([id number]) Visit Report for 05/15/2016 Arrival Information Details Patient Name: [name], [name]. Date of Service: 05/15/2016 9:00 AM Medical Record Patient Account Number: [id number] Number: Treating RN: Ahmed Prima 06/27/1961 (55 y.o. Other Clinician: Date of Birth/Sex: Male) Treating ROBSON, MICHAEL Primary Care Physician: NNODI, ADAKU Physician/Extender: G Referring Physician: Judeth Horn in Treatment: 10 Visit Information History Since Last Visit All ordered tests and consults were completed: No Patient Arrived: Ambulatory Added or deleted any medications: No Arrival Time: 09:21 Any new allergies or adverse reactions: No Accompanied By: self Had a fall or experienced change in No Transfer Assistance: None activities of daily living that may affect Patient Identification Verified: Yes risk of falls: Secondary Verification Process Yes Signs or symptoms of abuse/neglect since last No Completed: visito Patient Requires Transmission-Based No Hospitalized since last visit: No Precautions: Pain Present Now: No Patient Has Alerts: Yes Patient Alerts: DM II Electronic Signature(s) Signed: 05/16/2016 4:54:36 PM By: Alric Quan Entered By: Alric Quan on 05/15/2016 09:22:40 [name] ([id number]) -------------------------------------------------------------------------------- Clinic Level of Care Assessment Details Patient Name: [name]. Date of Service: 05/15/2016 9:00 AM Medical Record Patient Account Number: [id number] Number: Treating RN: Ahmed Prima 03/18/1961 (55 y.o. Other Clinician: Date of Birth/Sex: Male) Treating ROBSON, [place] Primary Care Physician: NNODI, ADAKU Physician/Extender: G Referring Physician: Gavin Pound Weeks in Treatment: 10 Clinic Level of Care Assessment Items TOOL 4 Quantity Score X - Use when only an EandM is performed on FOLLOW-UP visit 1 0 ASSESSMENTS -  Nursing Assessment / Reassessment X - Reassessment of Co-morbidities (includes updates in patient status) 1 10 X - Reassessment of Adherence to Treatment Plan 1 5 ASSESSMENTS - Wound and Skin Assessment / Reassessment X - Simple Wound Assessment / Reassessment - one wound 1 5 []  - Complex Wound Assessment / Reassessment - multiple wounds 0 []  - Dermatologic / Skin Assessment (not related to wound area) 0 ASSESSMENTS - Focused Assessment []  - Circumferential Edema Measurements - multi extremities 0 []  - Nutritional Assessment / Counseling / Intervention 0 []  - Lower Extremity Assessment (monofilament, tuning fork, pulses) 0 []  - Peripheral Arterial Disease Assessment (using hand held doppler) 0 ASSESSMENTS - Ostomy and/or Continence Assessment and Care []  - Incontinence Assessment and Management 0 []  - Ostomy Care Assessment and Management (repouching, etc.) 0 PROCESS - Coordination of Care X - Simple Patient / Family Education for ongoing care 1 15 []  - Complex (extensive) Patient / Family Education for ongoing care 0 X - Staff obtains Programmer, systems, Records, Test Results / Process Orders 1 10 []  - Staff telephones HHA, Nursing Homes / Clarify orders / etc 0 [name], [name] ([id number]) []  - Routine Transfer to another Facility (non-emergent condition) 0 []  - Routine Hospital Admission (non-emergent condition) 0 []  - New Admissions / Biomedical engineer / Ordering NPWT, Apligraf, etc. 0 []  - Emergency Hospital Admission (emergent condition) 0 []  - Simple Discharge Coordination 0 []  - Complex (extensive) Discharge Coordination 0 PROCESS - Special Needs []  - Pediatric / Minor Patient Management 0 []  - Isolation Patient Management 0 []  - Hearing / Language / Visual special needs 0 []  - Assessment of Community assistance (transportation, D/C planning, etc.) 0 []  - Additional assistance / Altered mentation 0 []  - Support Surface(s) Assessment (bed, cushion, seat, etc.) 0 INTERVENTIONS -  Wound Cleansing / Measurement X - Simple Wound Cleansing - one wound 1 5 []  - Complex Wound Cleansing - multiple wounds 0 X - Wound Imaging (photographs - any  number of wounds) 1 5 []  - Wound Tracing (instead of photographs) 0 X - Simple Wound Measurement - one wound 1 5 []  - Complex Wound Measurement - multiple wounds 0 INTERVENTIONS - Wound Dressings X - Small Wound Dressing one or multiple wounds 1 10 []  - Medium Wound Dressing one or multiple wounds 0 []  - Large Wound Dressing one or multiple wounds 0 X - Application of Medications - topical 1 5 []  - Application of Medications - injection 0 [name], [name]. ([id number]) INTERVENTIONS - Miscellaneous []  - External ear exam 0 []  - Specimen Collection (cultures, biopsies, blood, body fluids, etc.) 0 []  - Specimen(s) / Culture(s) sent or taken to Lab for analysis 0 []  - Patient Transfer (multiple staff / Harrel Lemon Lift / Similar devices) 0 []  - Simple Staple / Suture removal (25 or less) 0 []  - Complex Staple / Suture removal (26 or more) 0 []  - Hypo / Hyperglycemic Management (close monitor of Blood Glucose) 0 []  - Ankle / Brachial Index (ABI) - do not check if billed separately 0 X - Vital Signs 1 5 Has the patient been seen at the hospital within the last three years: Yes Total Score: 80 Level Of Care: New/Established - Level 3 Electronic Signature(s) Signed: 05/16/2016 4:54:36 PM By: Alric Quan Entered By: Alric Quan on 05/15/2016 16:23:50 [name] ([id number]) -------------------------------------------------------------------------------- Encounter Discharge Information Details Patient Name: [name]. Date of Service: 05/15/2016 9:00 AM Medical Record Patient Account Number: [id number] Number: Treating RN: Ahmed Prima 09-16-1960 (55 y.o. Other Clinician: Date of Birth/Sex: Male) Treating ROBSON, MICHAEL Primary Care Physician: Gavin Pound Physician/Extender: G Referring Physician:  Judeth Horn in Treatment: 10 Encounter Discharge Information Items Discharge Pain Level: 0 Discharge Condition: Stable Ambulatory Status: Ambulatory Discharge Destination: Home Transportation: Private Auto Accompanied By: self Schedule Follow-up Appointment: Yes Medication Reconciliation completed and provided to Patient/Care Yes [name]: Provided on Clinical Summary of Care: 05/15/2016 Form Type Recipient Paper Patient HP Electronic Signature(s) Signed: 05/15/2016 10:08:00 AM By: Ruthine Dose Entered By: Ruthine Dose on 05/15/2016 10:07:59 [name] ([id number]) -------------------------------------------------------------------------------- Lower Extremity Assessment Details Patient Name: [name]. Date of Service: 05/15/2016 9:00 AM Medical Record Patient Account Number: [id number] Number: Treating RN: Ahmed Prima [birth year] (55 y.o. Other Clinician: Date of Birth/Sex: Male) Treating ROBSON, MICHAEL Primary Care Physician: Gavin Pound Physician/Extender: G Referring Physician: Gavin Pound Weeks in Treatment: 10 Vascular Assessment Pulses: Posterior Tibial Dorsalis Pedis Palpable: [Left:Yes] Extremity colors, hair growth, and conditions: Extremity Color: [Left:Normal] Temperature of Extremity: [Left:Warm] Capillary Refill: [Left:< 3 seconds] Toe Nail Assessment Left: Right: Thick: Yes Discolored: Yes Deformed: No Improper Length and Hygiene: No Electronic Signature(s) Signed: 05/16/2016 4:54:36 PM By: Alric Quan Entered By: Alric Quan on 05/15/2016 09:24:12 [name] ([id number]) -------------------------------------------------------------------------------- Multi Wound Chart Details Patient Name: [name]. Date of Service: 05/15/2016 9:00 AM Medical Record Patient Account Number: [id number] Number: Treating RN: Ahmed Prima 1961-02-08 (55 y.o. 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] [N/A:N/A] Comorbid History: [6:Hypertension, Type II Diabetes, Neuropathy] [N/A:N/A] Date Acquired: [6:03/25/2016] [N/A:N/A] Weeks of Treatment: [6:4] [N/A:N/A] Wound Status: [6:Open] [N/A:N/A]  Measurements L x W x D 0.8x0.3x0.2 [N/A:N/A] (cm) Area (cm) : [6:0.188] [N/A:N/A] Volume (cm) : [6:0.038] [N/A:N/A] % Reduction in Area: [6:96.00%] [N/A:N/A] % Reduction in Volume: 96.00% [N/A:N/A] Classification: [6:Grade 1] [N/A:N/A] Exudate Amount: [6:Large] [N/A:N/A] Exudate Type: [6:Serosanguineous] [N/A:N/A] Exudate Color: [6:red, brown] [N/A:N/A] Wound Margin: [6:Thickened] [N/A:N/A] Granulation Amount: [6:Large (67-100%)] [N/A:N/A] Granulation Quality: [6:Pink, Pale] [N/A:N/A] Necrotic Amount: [6:Small (1-33%)] [N/A:N/A] Exposed Structures: [6:Fascia: No Fat: No Tendon: No Muscle: No] [N/A:N/A] Joint: No Bone: No Limited to Skin Breakdown Epithelialization: Small (1-33%) N/A N/A Periwound Skin Texture: Callus: Yes N/A N/A Edema: No Excoriation: No Induration: No Crepitus: No Fluctuance: No Friable: No Rash: No Scarring: No Periwound Skin Moist: Yes N/A N/A Moisture: Maceration: No Dry/Scaly: No Periwound Skin Color: Atrophie Blanche: No N/A N/A Cyanosis: No Ecchymosis: No Erythema: No Hemosiderin Staining: No Mottled: No Pallor: No Rubor: No Temperature: No Abnormality N/A N/A Tenderness on Yes N/A N/A Palpation: Wound  Preparation: Ulcer Cleansing: N/A N/A Rinsed/Irrigated with Saline Topical Anesthetic Applied: Other: Lidocaine 4% Treatment Notes Electronic Signature(s) Signed: 05/16/2016 4:54:36 PM By: Alric Quan Entered By: Alric Quan on 05/15/2016 09:27:34 [name] ([id number]) -------------------------------------------------------------------------------- [place] Details Patient Name: [name], [name]. Date of Service: 05/15/2016 9:00 AM Medical Record Patient Account Number: [id number] Number: Treating RN: Ahmed Prima 04-07-1961 (55 y.o. Other Clinician: Date of Birth/Sex: Male) Treating ROBSON, [place] Primary Care Physician: Gavin Pound Physician/Extender: G Referring Physician: Judeth Horn in Treatment: 10 Active Inactive Orientation to the Wound Care Program Nursing Diagnoses: Knowledge deficit related to the wound healing center program Goals: Patient/caregiver will verbalize understanding of the [place] Program Date Initiated: 04/16/2016 Goal Status: Active Interventions: Provide education on orientation to the wound center Notes: Wound/Skin Impairment Nursing Diagnoses: Impaired tissue integrity Knowledge deficit related to ulceration/compromised skin integrity Goals: Patient/caregiver will verbalize understanding of skin care regimen Date Initiated: 04/16/2016 Goal Status: Active Ulcer/skin breakdown will have a volume reduction of 30% by week 4 Date Initiated: 04/16/2016 Goal Status: Active Ulcer/skin breakdown will have a volume reduction of 50% by week 8 Date Initiated: 04/16/2016 Goal Status: Active Ulcer/skin breakdown will have a volume reduction of 80% by week 12 Date Initiated: 04/16/2016 Goal Status: Active Ulcer/skin breakdown will heal within 14 weeks [name], [name] ([id number]) Date Initiated: 04/16/2016 Goal Status: Active Interventions: Assess patient/caregiver ability  to obtain necessary supplies Assess patient/caregiver ability to perform ulcer/skin care regimen upon admission and as needed Assess ulceration(s) every visit Provide education on ulcer and skin care Treatment Activities: Skin care regimen initiated : 03/06/2016 Topical wound management initiated : 03/06/2016 Notes: Electronic Signature(s) Signed: 05/16/2016 4:54:36 PM By: Alric Quan Entered By: Alric Quan on 05/15/2016 09:27:22 [name] ([id number]) -------------------------------------------------------------------------------- Pain Assessment Details Patient Name: [name]. Date of Service: 05/15/2016 9:00 AM Medical Record Patient Account Number: [id number] Number: Treating RN: Ahmed Prima [birth year] (55 y.o. Other Clinician: Date of Birth/Sex: Male) Treating ROBSON, MICHAEL Primary Care Physician: Gavin Pound Physician/Extender: G Referring Physician: Gavin Pound Weeks in Treatment: 10 Active Problems Location of Pain Severity and Description of Pain Patient Has Paino No Site Locations With Dressing Change: No Pain Management and Medication Current Pain Management: Electronic Signature(s) Signed: 05/16/2016 4:54:36 PM By: Alric Quan Entered By: Alric Quan on 05/15/2016 09:21:23 [name] ([id number]) -------------------------------------------------------------------------------- Wound Assessment Details Patient Name: [name]. Date of Service: 05/15/2016 9:00 AM Medical Record Patient Account Number: [id number] Number: Treating RN: Ahmed Prima [birth year] (55 y.o. Other Clinician: Date  of Birth/Sex: Male) Treating ROBSON, [place] Primary Care Physician: NNODI, ADAKU Physician/Extender: G Referring Physician: Gavin Pound Weeks in Treatment: 10 Wound Status Wound Number: 6 Primary Diabetic Wound/Ulcer of the Lower Etiology: Extremity Wound Location: Left Foot - Lateral Wound Status:  Open Wounding Event: Gradually Appeared Comorbid Hypertension, Type II Diabetes, Date Acquired: 03/25/2016 History: Neuropathy Weeks Of Treatment: 4 Clustered Wound: No Photos Photo Uploaded By: Alric Quan on 05/15/2016 16:46:28 Wound Measurements Length: (cm) 0.8 Width: (cm) 0.3 Depth: (cm) 0.2 Area: (cm) 0.188 Volume: (cm) 0.038 % Reduction in Area: 96% % Reduction in Volume: 96% Epithelialization: Small (1-33%) Tunneling: No Undermining: No Wound Description Classification: Grade 1 Foul Odor Afte Wound Margin: Thickened Exudate Amount: Large Exudate Type: Serosanguineous Exudate Color: red, brown r Cleansing: No Wound Bed Granulation Amount: Large (67-100%) Exposed Structure Granulation Quality: Pink, Pale Fascia Exposed: No Necrotic Amount: Small (1-33%) Fat Layer Exposed: No Necrotic Quality: Adherent Slough Tendon Exposed: No Muscle Exposed: No [name], [name] ([id number]) Joint Exposed: No Bone Exposed: No Limited to Skin Breakdown Periwound Skin Texture Texture Color No Abnormalities Noted: No No Abnormalities Noted: No Callus: Yes Atrophie Blanche: No Crepitus: No Cyanosis: No Excoriation: No Ecchymosis: No Fluctuance: No Erythema: No Friable: No Hemosiderin Staining: No Induration: No Mottled: No Localized Edema: No Pallor: No Rash: No Rubor: No Scarring: No Temperature / Pain Moisture Temperature: No Abnormality No Abnormalities Noted: No Tenderness on Palpation: Yes Dry / Scaly: No Maceration: No Moist: Yes Wound Preparation Ulcer Cleansing: Rinsed/Irrigated with Saline Topical Anesthetic Applied: Other: Lidocaine 4%, Treatment Notes Wound #6 (Left, Lateral Foot) 1. Cleansed with: Clean wound with Normal Saline 2. Anesthetic Topical Lidocaine 4% cream to wound bed prior to debridement 4. Dressing Applied: Prisma Ag 5. Secondary Dressing Applied Gauze and Kerlix/Conform 7. Secured with Tape Notes Child psychotherapist) Signed: 05/16/2016 4:54:36 PM By: Alric Quan Entered By: Alric Quan on 05/15/2016 09:27:11 [name] ([id number]) -------------------------------------------------------------------------------- [place] Details Patient Name: [name]. Date of Service: 05/15/2016 9:00 AM Medical Record Patient Account Number: [id number] Number: Treating RN: Ahmed Prima 30-Apr-1961 (55 y.o. Other Clinician: Date of Birth/Sex: Male) Treating ROBSON, [place] Primary Care Physician: NNODI, ADAKU Physician/Extender: G Referring Physician: Gavin Pound Weeks in Treatment: 10 Vital Signs Time Taken: 09:21 Temperature (F): 97.4 Height (in): 70 Pulse (bpm): 88 Weight (lbs): 260 Respiratory Rate (breaths/min): 18 Body Mass Index (BMI): 37.3 Blood Pressure (mmHg): 158/89 Reference Range: 80 - 120 mg / dl Electronic Signature(s) Signed: 05/16/2016 4:54:36 PM By: Alric Quan Entered By: Alric Quan on 05/15/2016 [id number]

## 2016-05-17 NOTE — Progress Notes (Signed)
EILEEN, GAGEN ([id number]) Visit Report for 05/15/2016 Chief Complaint Document Details Patient Name: [name], [name]. Date of Service: 05/15/2016 9:00 AM Medical Record Patient Account Number: [id number] Number: Treating RN: Ahmed Prima August 07, 1960 (55 y.o. Other Clinician: Date of Birth/Sex: Male) Treating Jonavan Vanhorn Primary Care Physician/Extender: Garlan Fair Physician: Referring Physician: Judeth Horn in Treatment: 10 Information Obtained from: Patient Chief Complaint 03/06/16; patient returns today after further surgery on the left foot as well as a 6 week course of IV antibiotics Electronic Signature(s) Signed: 05/15/2016 4:57:31 PM By: [name] MD Entered By: [name] on 05/15/2016 10:25:25 [name] ([id number]) -------------------------------------------------------------------------------- HPI Details Patient Name: [name]. Date of Service: 05/15/2016 9:00 AM Medical Record Patient Account Number: [id number] Number: Treating RN: Ahmed Prima 07/28/1960 (55 y.o. Other Clinician: Date of Birth/Sex: Male) Treating Derry Arbogast Primary Care Physician/Extender: Ali Lowe, ADAKU Physician: Referring Physician: Gavin Pound Weeks in Treatment: 10 History of Present Illness HPI Description: Pleasant 55 year old with history of diabetes (Hgb A1c 10.8 in 2014) and peripheral neuropathy. No PVD. L ABI 1.1. Status post right great toe partial amputation years ago. He was at work and on 10/22/2014, was injured by a cart, and suffered an ulceration to his left anterior calf. He says that it subsequently became infected, and he was treated with a course of antibiotics. He was found on initial exam to have an ulceration on the dorsum of his left third toe. He was unaware of this and attributes it to pressure from his steel toed boots. More recently he injured his right anterior calf on a cart. Ambulating  normally per his baseline. He has been undergoing regular debridements, applying mupirocin cream, and an Ace wrap for edema control. He returns to clinic for follow-up and is without complaints. No pain. No fever or chills. No drainage. 10/25/15; this is a 55 year old man who has type II diabetes with diabetic polyneuropathy. He tells me that he fractured his left fifth metatarsal in June 2016 when he presented with swelling. He does not recall a specific injury. His hemoglobin A1c was apparently too high at the time for consideration of surgery and he was put in some form of offloading. Ultimately he went to surgery in December with an allograft from his calcaneus to this site, plate and screws. He had an x-ray of the foot in March that showed concerns about nonunion. He tells me that in March he had to move and basically moved himself. He was on his foot a lot and then noticed some drainage from an open area. He has been following with his orthopedic surgeon Dr. Doran Durand. He has been applying a felt donut, dry dressing and using his heel healing sandal. 11/01/15; this is a patient I saw last week for the first time. He had a small open wound on the plantar aspect of his left foot at roughly the level of the base of his fifth metatarsal. He had a considerable degree of thickened skin around this wound on the plantar aspect which I thought was from chronic pressure on this area. He tells [place] that he had drainage over the course of the week. No systemic symptoms. 11/08/15; culture last week grew Citrobacter korseri. This should've been sensitive to the Augmentin I gave him. He has seen Dr. Doran Durand who did his initial surgery and according the patient the plan is to give this another month and then the hardware might need to come out of this. This seems like  a reasonable plan. I will adjust his antibiotics to ciprofloxacin which probably should continue for at least another 2 weeks. I gave him 10 days  worth today 11/22/15 the patient has completed antibiotics. He has an appointment with Dr. Doran Durand this Friday. There is improved dimensions around the wound on the left fifth metatarsal base 11/29/15; the patient has completed antibiotics last week. Apparently his appointment with Dr. Doran Durand it is not until this Friday. Dimensions are roughly the same. 12/06/15; saw Dr. Doran Durand. No x-ray told the end of the month, next appointment June 30. We have been using Aquacel Ag 12/13/15: No major change this week. Using Aquacel AG AUDLEY, ROSEL. ([id number]) 621/17; arrived this week with maceration around the wound. There was quite a bit of undermining which required surgical debridement. I changed him to [facility] last week, by the patient's admission he was up on this more this week 12/27/15; macerated tissue around the wound is removed with a scalpel and pickups. There is no undermining. Nonviable subcutaneous tissue and skin taken from the superior circumference of the wound is slough from the surface. READMISSION 03/06/16 since I last saw this patient at the end of June, he went for surgery on 01/11/16 by Dr. Doran Durand of orthopedics. He had a left foot irrigation and debridement, removal of hardware and placement of wound VAC. He is also been followed by Dr. Megan Salon of infectious disease and completed a six-week course of IV Rocephin for group B strep and Enterobacter in the bone at the time of surgery. Apparently at the time of surgery the bone looked healthy so I don't think any bone was actually removed. He has been using silver alginate based dressings on the same wound area at the base of the left fifth metatarsal on the left. I note that he is also had arterial studies on 01/08/16, these showed a left ABI of 1.2 to and a right ABI of 1.3. Waveforms were listed as biphasic. He was not felt to have any specific arterial issues. 03/13/16; no real change in the condition of the wound at the left  lateral foot at roughly the base of his fifth metatarsal. Use silver alginate last week. 03/18/16 arrives today with no open area. Being suspicious of the overlying callus I. Some of this back although I see nothing but covering tissue here/epithelium. There is no surrounding tenderness READMISSION 04/16/16 this is a patient I discharged about a month ago. Initially a surgical wound on the lateral aspect of his left foot which subsequently became infected. The story he is giving today that he went back into his own modified shoe started to notice pain 2 weeks ago he was seen in Dr. Nona Dell office by a physician assistant last Wednesday and according the patient was told that everything looked fine however this is clearly now broken open and he has an open wound in the same spot that we have been dealing with repetitively. Situation is complicated by the fact that he is running short of money on long-term disability. He has not taken his insulin and at least 2 weeks was previously on NovoLog short acting insulin on a sliding scale and TRESIBA 35 units at bedtime. He is no longer able to afford any of his medication he was in the x-ray on 10/15. A plain x-ray showed healed fracture of the left fifth metatarsal bone status post removal of the associated plate and screw fixation hardware. There was no acute appearing osseous abnormality. His blood work showed  a white count of 9.2 with an essentially normal differential comprehensive metabolic panel was normal. Previous CT scan of the foot on 01/08/16 showed no osteomyelitis previous vascular workup showed no evidence of significant PAD on 01/13/16 04/23/16; culture I did last week grew enterococcus [ampicillin sensitive] and MRSA. He saw infectious disease yesterday. They stop the clindamycin and ordered an MRI. This is not unreasonable. All the hardware is out of the foot at this point. 04/30/16 at this point in time we are still awaiting the results  of the MRI at this point in time. Patient did have an area which appears to be somewhat macerated in the proximal portion of the wound where there is overlying necrotic skin that is doing nothing more than trapping fluid underneath. He continues to state that he does have some discomfort and again the concern is for the possibility of osteomyelitis hence the reason for the MRI order. We have been using a silver alginate dressing but again I think the reason this with his macerated as it was is that the dressing obscene could not reach the entirety of the wound due to some necrotic skin covering the proximal portion. No bleeding noted at this point in time on initial evaluation. 05/07/16 we receive the results of patient's MRI which shows that he has no evidence of osteomyelitis. This obviously is excellent news. Patient was definitely happy to hear this. He tells me the wound appears to be doing very well at this point in time and is pleased with the progress currently. KIONNE, HEINLEIN ([id number]) 05/15/16; as noted the patient did not have osteomyelitis. He has been released by infectious disease and orthopedics. His wound is still open he had a debridement last week but complain when he got home he "bleeding for half a day". He is not had any pain. We have been using silver alginate with Kerlix gauze wrap Electronic Signature(s) Signed: 05/15/2016 4:57:31 PM By: [name] MD Entered By: [name] on 05/15/2016 10:26:41 [name] ([id number]) -------------------------------------------------------------------------------- Physical Exam Details Patient Name: ROMELL, SANTOLI. Date of Service: 05/15/2016 9:00 AM Medical Record Patient Account Number: [id number] Number: Treating RN: Ahmed Prima 04-10-1961 (55 y.o. Other Clinician: Date of Birth/Sex: Male) Treating Tico Crotteau Primary Care Physician/Extender: Ali Lowe, ADAKU Physician: Referring  Physician: Gavin Pound Weeks in Treatment: 10 Constitutional Patient is hypertensive.. Pulse regular and within target range for patient.Marland Kitchen Respirations regular, non-labored and within target range.. Temperature is normal and within the target range for the patient.. Eyes No evidence of scleral icterus. Respiratory Respiratory effort is easy and symmetric bilaterally. Rate is normal at rest and on room air.. Cardiovascular Pedal pulses palpable and strong bilaterally.. Lymphatic Nonpalpable the popliteal or inguinal area. Psychiatric No evidence of depression, anxiety, or agitation. Calm, cooperative, and communicative. Appropriate interactions and affect.. Notes Wound exam; the patient has a small open area on the bottom of the foot. One edge of this small wound looks satisfactory the other has callus probably thick subcutaneous skin. This may need a further debridement if this does not progress towards closur there is no evidence of infection here e. Electronic Signature(s) Signed: 05/15/2016 4:57:31 PM By: [name] MD Entered By: [name] on 05/15/2016 10:28:14 [name] ([id number]) -------------------------------------------------------------------------------- Physician Orders Details Patient Name: ANSUMANA, GENS. Date of Service: 05/15/2016 9:00 AM Medical Record Patient Account Number: [id number] Number: Treating RN: Ahmed Prima 1960-12-25 (55 y.o. Other Clinician: Date of Birth/Sex: Male) Treating Cary Lothrop Primary Care Physician/Extender:  Ali Lowe, ADAKU Physician: Referring Physician: Judeth Horn in Treatment: 10 Verbal / Phone Orders: Yes Clinician: Carolyne Fiscal, Debi Read Back and Verified: Yes Diagnosis Coding Wound Cleansing Wound #6 Left,Lateral Foot o Cleanse wound with mild soap and water o May Shower, gently pat wound dry prior to applying new dressing. o May shower with protection. Anesthetic Wound  #6 Left,Lateral Foot o Topical Lidocaine 4% cream applied to wound bed prior to debridement Primary Wound Dressing Wound #6 Left,Lateral Foot o Aquacel Ag Secondary Dressing Wound #6 Left,Lateral Foot o Gauze and Kerlix/Conform Dressing Change Frequency Wound #6 Left,Lateral Foot o Change dressing every day. Follow-up Appointments Wound #6 Left,Lateral Foot o Return Appointment in 1 week. Off-Loading Wound #6 Left,Lateral Foot o Open toe surgical shoe with peg assist. Additional Orders / Instructions Wound #6 Left,Lateral Foot MACHI, VANPATTEN. ([id number]) o Increase protein intake. o Activity as tolerated Electronic Signature(s) Signed: 05/15/2016 4:57:31 PM By: [name] MD Signed: 05/16/2016 4:54:36 PM By: Alric Quan Entered By: Alric Quan on 05/15/2016 10:03:21 [name] ([id number]) -------------------------------------------------------------------------------- Problem List Details Patient Name: ITAMAR, GALLIER. Date of Service: 05/15/2016 9:00 AM Medical Record Patient Account Number: [id number] Number: Treating RN: Ahmed Prima 1961-03-02 (55 y.o. Other Clinician: Date of Birth/Sex: Male) Treating Katriona Schmierer Primary Care Physician/Extender: Ali Lowe, Doreene Burke Physician: Referring Physician: Judeth Horn in Treatment: 10 Active Problems ICD-10 Encounter Code Description Active Date Diagnosis E11.621 Type 2 diabetes mellitus with foot ulcer 03/06/2016 Yes L97.523 Non-pressure chronic ulcer of other part of left foot with 03/06/2016 Yes necrosis of muscle M86.672 Other chronic osteomyelitis, left ankle and foot 03/06/2016 Yes Inactive Problems Resolved Problems Electronic Signature(s) Signed: 05/15/2016 4:57:31 PM By: [name] MD Entered By: [name] on 05/15/2016 10:25:01 [name]  ([id number]) -------------------------------------------------------------------------------- Progress Note Details Patient Name: [name]. Date of Service: 05/15/2016 9:00 AM Medical Record Patient Account Number: [id number] Number: Treating RN: Ahmed Prima [birth year] (56 y.o. Other Clinician: Date of Birth/Sex: Male) Treating Babyboy Loya Primary Care Physician/Extender: Garlan Fair Physician: Referring Physician: Judeth Horn in Treatment: 10 Subjective Chief Complaint Information obtained from Patient 03/06/16; patient returns today after further surgery on the left foot as well as a 6 week course of IV antibiotics History of Present Illness (HPI) Pleasant 55 year old with history of diabetes (Hgb A1c 10.8 in 2014) and peripheral neuropathy. No PVD. L ABI 1.1. Status post right great toe partial amputation years ago. He was at work and on 10/22/2014, was injured by a cart, and suffered an ulceration to his left anterior calf. He says that it subsequently became infected, and he was treated with a course of antibiotics. He was found on initial exam to have an ulceration on the dorsum of his left third toe. He was unaware of this and attributes it to pressure from his steel toed boots. More recently he injured his right anterior calf on a cart. Ambulating normally per his baseline. He has been undergoing regular debridements, applying mupirocin cream, and an Ace wrap for edema control. He returns to clinic for follow-up and is without complaints. No pain. No fever or chills. No drainage. 10/25/15; this is a 55 year old man who has type II diabetes with diabetic polyneuropathy. He tells me that he fractured his left fifth metatarsal in June 2016 when he presented with swelling. He does not recall a specific injury. His hemoglobin A1c was apparently too high at the time for consideration of surgery and he was put in some  form of offloading. Ultimately he  went to surgery in December with an allograft from his calcaneus to this site, plate and screws. He had an x-ray of the foot in March that showed concerns about nonunion. He tells me that in March he had to move and basically moved himself. He was on his foot a lot and then noticed some drainage from an open area. He has been following with his orthopedic surgeon Dr. Doran Durand. He has been applying a felt donut, dry dressing and using his heel healing sandal. 11/01/15; this is a patient I saw last week for the first time. He had a small open wound on the plantar aspect of his left foot at roughly the level of the base of his fifth metatarsal. He had a considerable degree of thickened skin around this wound on the plantar aspect which I thought was from chronic pressure on this area. He tells [place] that he had drainage over the course of the week. No systemic symptoms. 11/08/15; culture last week grew Citrobacter korseri. This should've been sensitive to the Augmentin I gave him. He has seen Dr. Doran Durand who did his initial surgery and according the patient the plan is to give this another month and then the hardware might need to come out of this. This seems like a reasonable plan. I will adjust his antibiotics to ciprofloxacin which probably should continue for at least another 2 weeks. I gave him 10 days worth today 11/22/15 the patient has completed antibiotics. He has an appointment with Dr. Doran Durand this Friday. There is MARLOS, THIERY ([id number]) improved dimensions around the wound on the left fifth metatarsal base 11/29/15; the patient has completed antibiotics last week. Apparently his appointment with Dr. Doran Durand it is not until this Friday. Dimensions are roughly the same. 12/06/15; saw Dr. Doran Durand. No x-ray told the end of the month, next appointment June 30. We have been using Aquacel Ag 12/13/15: No major change this week. Using Aquacel AG 621/17; arrived this week with maceration around the wound.  There was quite a bit of undermining which required surgical debridement. I changed him to [facility] last week, by the patient's admission he was up on this more this week 12/27/15; macerated tissue around the wound is removed with a scalpel and pickups. There is no undermining. Nonviable subcutaneous tissue and skin taken from the superior circumference of the wound is slough from the surface. READMISSION 03/06/16 since I last saw this patient at the end of June, he went for surgery on 01/11/16 by Dr. Doran Durand of orthopedics. He had a left foot irrigation and debridement, removal of hardware and placement of wound VAC. He is also been followed by Dr. Megan Salon of infectious disease and completed a six-week course of IV Rocephin for group B strep and Enterobacter in the bone at the time of surgery. Apparently at the time of surgery the bone looked healthy so I don't think any bone was actually removed. He has been using silver alginate based dressings on the same wound area at the base of the left fifth metatarsal on the left. I note that he is also had arterial studies on 01/08/16, these showed a left ABI of 1.2 to and a right ABI of 1.3. Waveforms were listed as biphasic. He was not felt to have any specific arterial issues. 03/13/16; no real change in the condition of the wound at the left lateral foot at roughly the base of his fifth metatarsal. Use silver alginate last week. 03/18/16  arrives today with no open area. Being suspicious of the overlying callus I. Some of this back although I see nothing but covering tissue here/epithelium. There is no surrounding tenderness READMISSION 04/16/16 this is a patient I discharged about a month ago. Initially a surgical wound on the lateral aspect of his left foot which subsequently became infected. The story he is giving today that he went back into his own modified shoe started to notice pain 2 weeks ago he was seen in Dr. Nona Dell office by a  physician assistant last Wednesday and according the patient was told that everything looked fine however this is clearly now broken open and he has an open wound in the same spot that we have been dealing with repetitively. Situation is complicated by the fact that he is running short of money on long-term disability. He has not taken his insulin and at least 2 weeks was previously on NovoLog short acting insulin on a sliding scale and TRESIBA 35 units at bedtime. He is no longer able to afford any of his medication he was in the x-ray on 10/15. A plain x-ray showed healed fracture of the left fifth metatarsal bone status post removal of the associated plate and screw fixation hardware. There was no acute appearing osseous abnormality. His blood work showed a white count of 9.2 with an essentially normal differential comprehensive metabolic panel was normal. Previous CT scan of the foot on 01/08/16 showed no osteomyelitis previous vascular workup showed no evidence of significant PAD on 01/13/16 04/23/16; culture I did last week grew enterococcus [ampicillin sensitive] and MRSA. He saw infectious disease yesterday. They stop the clindamycin and ordered an MRI. This is not unreasonable. All the hardware is out of the foot at this point. 04/30/16 at this point in time we are still awaiting the results of the MRI at this point in time. Patient did have an area which appears to be somewhat macerated in the proximal portion of the wound where there is overlying necrotic skin that is doing nothing more than trapping fluid underneath. He continues to state that he does have some discomfort and again the concern is for the possibility of osteomyelitis hence the reason VENIAMIN, WEIGMAN. ([id number]) for the MRI order. We have been using a silver alginate dressing but again I think the reason this with his macerated as it was is that the dressing obscene could not reach the entirety of the wound due to  some necrotic skin covering the proximal portion. No bleeding noted at this point in time on initial evaluation. 05/07/16 we receive the results of patient's MRI which shows that he has no evidence of osteomyelitis. This obviously is excellent news. Patient was definitely happy to hear this. He tells me the wound appears to be doing very well at this point in time and is pleased with the progress currently. 05/15/16; as noted the patient did not have osteomyelitis. He has been released by infectious disease and orthopedics. His wound is still open he had a debridement last week but complain when he got home he "bleeding for half a day". He is not had any pain. We have been using silver alginate with Kerlix gauze wrap Objective Constitutional Patient is hypertensive.. Pulse regular and within target range for patient.Marland Kitchen Respirations regular, non-labored and within target range.. Temperature is normal and within the target range for the patient.. Vitals Time Taken: 9:21 AM, Height: 70 in, Weight: 260 lbs, BMI: 37.3, Temperature: 97.4 F, Pulse: 88 bpm, Respiratory  Rate: 18 breaths/min, Blood Pressure: 158/89 mmHg. Eyes No evidence of scleral icterus. Respiratory Respiratory effort is easy and symmetric bilaterally. Rate is normal at rest and on room air.. Cardiovascular Pedal pulses palpable and strong bilaterally.. Lymphatic Nonpalpable the popliteal or inguinal area. Psychiatric No evidence of depression, anxiety, or agitation. Calm, cooperative, and communicative. Appropriate interactions and affect.. General Notes: Wound exam; the patient has a small open area on the bottom of the foot. One edge of this small wound looks satisfactory the other has callus probably thick subcutaneous skin. This may need a further debridement if this does not progress towards closur there is no evidence of infection here e. Integumentary (Hair, Skin) Wound #6 status is Open. Original cause of wound was  Gradually Appeared. The wound is located on the Left,Lateral Foot. The wound measures 0.8cm length x 0.3cm width x 0.2cm depth; 0.188cm^2 area and 0.038cm^3 volume. The wound is limited to skin breakdown. There is no tunneling or undermining noted. YARON, RINDERKNECHT ([id number]) There is a large amount of serosanguineous drainage noted. The wound margin is thickened. There is large (67-100%) pink, pale granulation within the wound bed. There is a small (1-33%) amount of necrotic tissue within the wound bed including Adherent Slough. The periwound skin appearance exhibited: Callus, Moist. The periwound skin appearance did not exhibit: Crepitus, Excoriation, Fluctuance, Friable, Induration, Localized Edema, Rash, Scarring, Dry/Scaly, Maceration, Atrophie Blanche, Cyanosis, Ecchymosis, Hemosiderin Staining, Mottled, Pallor, Rubor, Erythema. Periwound temperature was noted as No Abnormality. The periwound has tenderness on palpation. 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 May shower with protection. Anesthetic: Wound #6 Left,Lateral Foot: Topical Lidocaine 4% cream applied to wound bed prior to debridement Primary Wound Dressing: Wound #6 Left,Lateral Foot: Aquacel Ag Secondary Dressing: Wound #6 Left,Lateral Foot: Gauze and Kerlix/Conform Dressing Change Frequency: Wound #6 Left,Lateral Foot: Change dressing every day. Follow-up Appointments: Wound #6 Left,Lateral Foot: Return Appointment in 1 week. Off-Loading: Wound #6 Left,Lateral Foot: Open toe surgical shoe with peg assist. MALAKII, HARL. ([id number]) Additional Orders /  Instructions: Wound #6 Left,Lateral Foot: Increase protein intake. Activity as tolerated Continue with silver alginate,gauze,kerlix and Conform Review in 2 weeks. At that point is this progressing towards closureooo Electronic Signature(s) Signed: 05/15/2016 4:57:31 PM By: [name] MD Entered By: [name] on 05/15/2016 10:30:02 [name] ([id number]) -------------------------------------------------------------------------------- [place] Details Patient Name: [name]. Date of Service: 05/15/2016 Medical Record Patient Account Number: [id number] Number: Treating RN: Ahmed Prima [birth year] (55 y.o. Other Clinician: Date of Birth/Sex: Male) Treating Hiroto Saltzman Primary Care Physician: Gavin Pound Physician/Extender: G Referring Physician: Judeth Horn in Treatment: 10 Diagnosis Coding ICD-10 Codes Code Description E11.621 Type 2 diabetes mellitus with foot ulcer L97.523 Non-pressure chronic ulcer of other part of left foot with necrosis of muscle M86.672 Other chronic osteomyelitis, left ankle and foot Facility Procedures CPT4 Code: [id number] Description: 99213 - WOUND CARE VISIT-LEV 3 EST PT Modifier: Quantity: 1 Physician Procedures CPT4 Code Description: [id number] - WC PHYS LEVEL 3 - EST PT ICD-10 Description Diagnosis E11.621 Type 2 diabetes mellitus with foot ulcer L97.523 Non-pressure chronic ulcer of other part of left foo Modifier: t with necrosis Quantity: 1 of  muscle Electronic Signature(s) Signed: 05/15/2016 4:57:31 PM By: [name] MD Signed: 05/16/2016 4:54:36 PM By: Alric Quan Entered By: Alric Quan on 05/15/2016 16:23:58

## 2016-05-22 ENCOUNTER — Telehealth: Payer: Self-pay | Admitting: Endocrinology

## 2016-05-22 NOTE — Telephone Encounter (Signed)
I contacted the patient and advised this refill would need to come from the prescribing MD via voicemail.

## 2016-05-22 NOTE — Telephone Encounter (Signed)
tamsulosin (FLOMAX) 0.4 MG CAPS capsule losartan-hydrochlorothiazide (HYZAAR) 100-25 MG tablet  [id number] - [place], [place] [phone] (Phone) [phone] (Fax)   Please call it in today, if you can

## 2016-05-27 NOTE — Telephone Encounter (Signed)
Refill of losartan-hydrochlorothiazide (HYZAAR) 100-25 MG tablet [name] [address] [place] - [place] [phone] (Phone) [phone] (Fax)

## 2016-05-27 NOTE — Telephone Encounter (Signed)
I contacted the patient and advised of message. Patient voiced understanding.  

## 2016-05-27 NOTE — Telephone Encounter (Signed)
Please refer request to PCP 

## 2016-05-27 NOTE — Telephone Encounter (Signed)
See message and please advise if ok to refill. This medication is listed under a historical provider. Thanks!

## 2016-05-28 ENCOUNTER — Encounter: Payer: Managed Care, Other (non HMO) | Admitting: Internal Medicine

## 2016-05-28 DIAGNOSIS — E11621 Type 2 diabetes mellitus with foot ulcer: Secondary | ICD-10-CM | POA: Diagnosis not present

## 2016-05-29 NOTE — Progress Notes (Signed)
MARCAS, BABCOCK ([id number]) Visit Report for 05/28/2016 Arrival Information Details Patient Name: [name], [name]. Date of Service: 05/28/2016 10:15 AM Medical Record Patient Account Number: [id number] Number: Treating RN: Baruch Gouty, RN, BSN, Rita 31-Dec-1960 (55 y.o. Other Clinician: Date of Birth/Sex: Male) Treating ROBSON, MICHAEL Primary Care Physician: NNODI, ADAKU Physician/Extender: G Referring Physician: Judeth Horn in Treatment: 11 Visit Information History Since Last Visit All ordered tests and consults were completed: No Patient Arrived: Ambulatory Added or deleted any medications: No Arrival Time: 10:54 Any new allergies or adverse reactions: No Accompanied By: self Had a fall or experienced change in No Transfer Assistance: None activities of daily living that may affect Patient Identification Verified: Yes risk of falls: Secondary Verification Process Yes Signs or symptoms of abuse/neglect since last No Completed: visito Patient Requires Transmission-Based No Hospitalized since last visit: No Precautions: Has Dressing in Place as Prescribed: Yes Patient Has Alerts: Yes Pain Present Now: No Patient Alerts: DM II Electronic Signature(s) Signed: 05/28/2016 5:52:39 PM By: Regan Lemming BSN, RN Entered By: Regan Lemming on 05/28/2016 10:54:25 [name] ([id number]) -------------------------------------------------------------------------------- Encounter Discharge Information Details Patient Name: [name]. Date of Service: 05/28/2016 10:15 AM Medical Record Patient Account Number: [id number] Number: Treating RN: Baruch Gouty, RN, BSN, Rita Aug 19, 1960 (55 y.o. Other Clinician: Date of Birth/Sex: Male) Treating ROBSON, MICHAEL Primary Care Physician: Gavin Pound Physician/Extender: G Referring Physician: Judeth Horn in Treatment: 11 Encounter Discharge Information Items Discharge Pain Level: 0 Discharge Condition:  Stable Ambulatory Status: Ambulatory Discharge Destination: Home Transportation: Private Auto Accompanied By: self Schedule Follow-up Appointment: No Medication Reconciliation completed and provided to Patient/Care No Alpha [place]: Provided on Clinical Summary of Care: 05/28/2016 Form Type Recipient Paper Patient HP Electronic Signature(s) Signed: 05/28/2016 12:51:20 PM By: Regan Lemming BSN, RN Previous Signature: 05/28/2016 11:29:53 AM Version By: Ruthine Dose Entered By: Regan Lemming on 05/28/2016 12:51:20 [name] ([id number]) -------------------------------------------------------------------------------- Lower Extremity Assessment Details Patient Name: [name]. Date of Service: 05/28/2016 10:15 AM Medical Record Patient Account Number: [id number] Number: Treating RN: Baruch Gouty, RN, BSN, Rita 01-Jun-1961 (55 y.o. Other Clinician: Date of Birth/Sex: Male) Treating ROBSON, MICHAEL Primary Care Physician: Gavin Pound Physician/Extender: G Referring Physician: Judeth Horn in Treatment: 11 Vascular Assessment Claudication: Claudication Assessment [Left:None] Pulses: Posterior Tibial Dorsalis Pedis Palpable: [Left:Yes] Extremity colors, hair growth, and conditions: Extremity Color: [Left:Mottled] Hair Growth on Extremity: [Left:Yes] Temperature of Extremity: [Left:Warm] Capillary Refill: [Left:< 3 seconds] Toe Nail Assessment Left: Right: Thick: Yes Discolored: Yes Deformed: No Improper Length and Hygiene: No Electronic Signature(s) Signed: 05/28/2016 5:52:39 PM By: Regan Lemming BSN, RN Entered By: Regan Lemming on 05/28/2016 11:01:08 [name] ([id number]) -------------------------------------------------------------------------------- Multi Wound Chart Details Patient Name: [name]. Date of Service: 05/28/2016 10:15 AM Medical Record Patient Account Number: [id number] Number: Treating RN: Baruch Gouty, RN, BSN,  Rita 1960-12-05 (55 y.o. Other Clinician: Date of Birth/Sex: Male) Treating ROBSON, MICHAEL Primary Care Physician: NNODI, Doreene Burke Physician/Extender: G Referring Physician: Gavin Pound Weeks in Treatment: 11 Vital Signs Height(in): 70 Pulse(bpm): 97 Weight(lbs): 260 Blood Pressure 143/79 (mmHg): Body Mass Index(BMI): 37 Temperature(F): 97.8 Respiratory Rate 18 (breaths/min): Photos: [6:No Photos] [N/A:N/A] Wound Location: [6:Left Foot - Lateral] [N/A:N/A] Wounding Event: [6:Gradually Appeared] [N/A:N/A] Primary Etiology: [6:Diabetic Wound/Ulcer of the Lower Extremity] [N/A:N/A] Comorbid History: [6:Hypertension, Type II Diabetes, Neuropathy] [N/A:N/A] Date Acquired: [6:03/25/2016] [N/A:N/A] Weeks of Treatment: [6:6] [N/A:N/A] Wound Status: [6:Open] [N/A:N/A] Measurements L x W x D 0.8x0.3x0.2 [N/A:N/A] (cm) Area (cm) : [6:0.188] [N/A:N/A] Volume (cm) : [  6:0.038] [N/A:N/A] % Reduction in Area: [6:96.00%] [N/A:N/A] % Reduction in Volume: 96.00% [N/A:N/A] Classification: [6:Grade 1] [N/A:N/A] Exudate Amount: [6:Large] [N/A:N/A] Exudate Type: [6:Serosanguineous] [N/A:N/A] Exudate Color: [6:red, brown] [N/A:N/A] Wound Margin: [6:Thickened] [N/A:N/A] Granulation Amount: [6:Large (67-100%)] [N/A:N/A] Granulation Quality: [6:Pink, Pale] [N/A:N/A] Necrotic Amount: [6:Small (1-33%)] [N/A:N/A] Exposed Structures: [6:Fascia: No Fat: No Tendon: No Muscle: No] [N/A:N/A] Joint: No Bone: No Limited to Skin Breakdown Epithelialization: Small (1-33%) N/A N/A Periwound Skin Texture: Callus: Yes N/A N/A Edema: No Excoriation: No Induration: No Crepitus: No Fluctuance: No Friable: No Rash: No Scarring: No Periwound Skin Moist: Yes N/A N/A Moisture: Maceration: No Dry/Scaly: No Periwound Skin Color: Atrophie Blanche: No N/A N/A Cyanosis: No Ecchymosis: No Erythema: No Hemosiderin Staining: No Mottled: No Pallor: No Rubor: No Temperature: No Abnormality N/A  N/A Tenderness on Yes N/A N/A Palpation: Wound Preparation: Ulcer Cleansing: N/A N/A Rinsed/Irrigated with Saline Topical Anesthetic Applied: Other: Lidocaine 4% Treatment Notes Electronic Signature(s) Signed: 05/28/2016 5:52:39 PM By: Regan Lemming BSN, RN Entered By: Regan Lemming on 05/28/2016 11:02:00 [name] ([id number]) -------------------------------------------------------------------------------- [place] Details Patient Name: [name], [name]. Date of Service: 05/28/2016 10:15 AM Medical Record Patient Account Number: [id number] Number: Treating RN: Baruch Gouty, RN, BSN, Rita 22-Nov-1960 (55 y.o. Other Clinician: Date of Birth/Sex: Male) Treating ROBSON, [place] Primary Care Physician: Gavin Pound Physician/Extender: G Referring Physician: Judeth Horn in Treatment: 11 Active Inactive Orientation to the Wound Care Program Nursing Diagnoses: Knowledge deficit related to the wound healing center program Goals: Patient/caregiver will verbalize understanding of the [place] Program Date Initiated: 04/16/2016 Goal Status: Active Interventions: Provide education on orientation to the wound center Notes: Wound/Skin Impairment Nursing Diagnoses: Impaired tissue integrity Knowledge deficit related to ulceration/compromised skin integrity Goals: Patient/caregiver will verbalize understanding of skin care regimen Date Initiated: 04/16/2016 Goal Status: Active Ulcer/skin breakdown will have a volume reduction of 30% by week 4 Date Initiated: 04/16/2016 Goal Status: Active Ulcer/skin breakdown will have a volume reduction of 50% by week 8 Date Initiated: 04/16/2016 Goal Status: Active Ulcer/skin breakdown will have a volume reduction of 80% by week 12 Date Initiated: 04/16/2016 Goal Status: Active Ulcer/skin breakdown will heal within 14 weeks [name], [name] ([id number]) Date Initiated: 04/16/2016 Goal Status:  Active Interventions: Assess patient/caregiver ability to obtain necessary supplies Assess patient/caregiver ability to perform ulcer/skin care regimen upon admission and as needed Assess ulceration(s) every visit Provide education on ulcer and skin care Treatment Activities: Skin care regimen initiated : 03/06/2016 Topical wound management initiated : 03/06/2016 Notes: Electronic Signature(s) Signed: 05/28/2016 5:52:39 PM By: Regan Lemming BSN, RN Entered By: Regan Lemming on 05/28/2016 11:01:48 [name] ([id number]) -------------------------------------------------------------------------------- Pain Assessment Details Patient Name: [name]. Date of Service: 05/28/2016 10:15 AM Medical Record Patient Account Number: [id number] Number: Treating RN: Baruch Gouty, RN, BSN, Rita 26-Feb-1961 (55 y.o. Other Clinician: Date of Birth/Sex: Male) Treating ROBSON, MICHAEL Primary Care Physician: Gavin Pound Physician/Extender: G Referring Physician: Judeth Horn in Treatment: 11 Active Problems Location of Pain Severity and Description of Pain Patient Has Paino No Site Locations With Dressing Change: No Pain Management and Medication Current Pain Management: Electronic Signature(s) Signed: 05/28/2016 5:52:39 PM By: Regan Lemming BSN, RN Entered By: Regan Lemming on 05/28/2016 10:54:38 [name] ([id number]) -------------------------------------------------------------------------------- Patient/Caregiver Education Details Patient Name: [name]. Date of Service: 05/28/2016 10:15 AM Medical Record Patient Account Number: [id number] Number: Treating RN: Baruch Gouty, RN, BSN, Rita [birth year] (55 y.o. Other Clinician: Date of Birth/Gender: Male) Treating ROBSON,  MICHAEL Primary Care Physician: Gavin Pound Physician/Extender: G Referring Physician: Judeth Horn in Treatment: 11 Education Assessment Education Provided To: Patient Education  Topics Provided Welcome To [facility]: Methods: Explain/Verbal Responses: State content correctly Wound/Skin Impairment: Methods: Explain/Verbal Responses: State content correctly Electronic Signature(s) Signed: 05/28/2016 5:52:39 PM By: Regan Lemming BSN, RN Entered By: Regan Lemming on 05/28/2016 12:51:35 [name] ([id number]) -------------------------------------------------------------------------------- Wound Assessment Details Patient Name: [name]. Date of Service: 05/28/2016 10:15 AM Medical Record Patient Account Number: [id number] Number: Treating RN: Baruch Gouty, RN, BSN, Rita 23-Mar-1961 (55 y.o. Other Clinician: Date of Birth/Sex: Male) Treating ROBSON, [place] Primary Care Physician: Gavin Pound Physician/Extender: G Referring Physician: Gavin Pound Weeks in Treatment: 11 Wound Status Wound Number: 6 Primary Diabetic Wound/Ulcer of the Lower Etiology: Extremity Wound Location: Left Foot - Lateral Wound Status: Open Wounding Event: Gradually Appeared Comorbid Hypertension, Type II Diabetes, Date Acquired: 03/25/2016 History: Neuropathy Weeks Of Treatment: 6 Clustered Wound: No Photos Photo Uploaded By: Regan Lemming on 05/28/2016 17:38:46 Wound Measurements Length: (cm) 0.8 Width: (cm) 0.3 Depth: (cm) 0.2 Area: (cm) 0.188 Volume: (cm) 0.038 % Reduction in Area: 96% % Reduction in Volume: 96% Epithelialization: Small (1-33%) Tunneling: No Undermining: No Wound Description Classification: Grade 1 Foul Odor Aft Wound Margin: Thickened Exudate Amount: Large JENTEZEN, [name] ([id number]) er Cleansing: No Exudate Type: Serosanguineous Exudate Color: red, brown Wound Bed Granulation Amount: Large (67-100%) Exposed Structure Granulation Quality: Pink, Pale Fascia Exposed: No Necrotic Amount: Small (1-33%) Fat Layer Exposed: No Necrotic Quality: Adherent Slough Tendon Exposed: No Muscle Exposed: No Joint Exposed:  No Bone Exposed: No Limited to Skin Breakdown Periwound Skin Texture Texture Color No Abnormalities Noted: No No Abnormalities Noted: No Callus: Yes Atrophie Blanche: No Crepitus: No Cyanosis: No Excoriation: No Ecchymosis: No Fluctuance: No Erythema: No Friable: No Hemosiderin Staining: No Induration: No Mottled: No Localized Edema: No Pallor: No Rash: No Rubor: No Scarring: No Temperature / Pain Moisture Temperature: No Abnormality No Abnormalities Noted: No Tenderness on Palpation: Yes Dry / Scaly: No Maceration: No Moist: Yes Wound Preparation Ulcer Cleansing: Rinsed/Irrigated with Saline Topical Anesthetic Applied: Other: Lidocaine 4%, Treatment Notes Wound #6 (Left, Lateral Foot) 1. Cleansed with: Clean wound with Normal Saline 4. Dressing Applied: Prisma Ag 5. Secondary Dressing Applied Gauze and Kerlix/Conform 7. Secured with Tape Notes [name], [name] ([id number]) [place] Electronic Signature(s) Signed: 05/28/2016 5:52:39 PM By: Regan Lemming BSN, RN Entered By: Regan Lemming on 05/28/2016 11:01:42 [name] ([id number]) -------------------------------------------------------------------------------- [place] Details Patient Name: [name]. Date of Service: 05/28/2016 10:15 AM Medical Record Patient Account Number: [id number] Number: Treating RN: Baruch Gouty, RN, BSN, Rita Mar 20, 1961 (55 y.o. Other Clinician: Date of Birth/Sex: Male) Treating ROBSON, [place] Primary Care Physician: NNODI, ADAKU Physician/Extender: G Referring Physician: Gavin Pound Weeks in Treatment: 11 Vital Signs Time Taken: 10:58 Temperature (F): 97.8 Height (in): 70 Pulse (bpm): 97 Weight (lbs): 260 Respiratory Rate (breaths/min): 18 Body Mass Index (BMI): 37.3 Blood Pressure (mmHg): 143/79 Reference Range: 80 - 120 mg / dl Electronic Signature(s) Signed: 05/28/2016 5:52:39 PM By: Regan Lemming BSN, RN Entered By: Regan Lemming on 05/28/2016 11:00:07

## 2016-05-29 NOTE — Progress Notes (Signed)
PRODIGY, DUBOW ([id number]) Visit Report for 05/28/2016 Chief Complaint Document Details Patient Name: [name], [name]. Date of Service: 05/28/2016 10:15 AM Medical Record Patient Account Number: [id number] Number: Treating RN: Baruch Gouty, RN, BSN, Rita 1961/04/22 (55 y.o. Other Clinician: Date of Birth/Sex: Male) Treating Nga Rabon Primary Care Physician/Extender: Garlan Fair Physician: Referring Physician: Judeth Horn in Treatment: 11 Information Obtained from: Patient Chief Complaint 03/06/16; patient returns today after further surgery on the left foot as well as a 6 week course of IV antibiotics Electronic Signature(s) Signed: 05/28/2016 6:11:07 PM By: [name] MD Entered By: [name] on 05/28/2016 12:50:47 [name] ([id number]) -------------------------------------------------------------------------------- Debridement Details Patient Name: [name]. Date of Service: 05/28/2016 10:15 AM Medical Record Patient Account Number: [id number] Number: Treating RN: Baruch Gouty, RN, BSN, Rita [birth year] (55 y.o. Other Clinician: Date of Birth/Sex: Male) Treating Ara Grandmaison Primary Care Physician/Extender: Ali Lowe, ADAKU Physician: Referring Physician: Judeth Horn in Treatment: 11 Debridement Performed for Wound #6 Left,Lateral Foot Assessment: Performed By: Physician Ricard Dillon, MD Debridement: Debridement Pre-procedure Yes - 11:14 Verification/Time Out Taken: Start Time: 11:14 Pain Control: Lidocaine 4% Topical Solution Level: Skin/Subcutaneous Tissue Total Area Debrided (L x 0.8 (cm) x 0.3 (cm) = 0.24 (cm) W): Tissue and other Non-Viable, Callus, Fibrin/Slough, Subcutaneous material debrided: Instrument: Curette Bleeding: Minimum Hemostasis Achieved: Pressure End Time: 11:18 Procedural Pain: 0 Post Procedural Pain: 0 Response to Treatment: Procedure was tolerated well Post Debridement  Measurements of Total Wound Length: (cm) 0.8 Width: (cm) 0.5 Depth: (cm) 0.2 Volume: (cm) 0.063 Character of Wound/Ulcer Post Requires Further Debridement Debridement: Severity of Tissue Post Debridement: Fat layer exposed Post Procedure Diagnosis Same as Pre-procedure Electronic Signature(s) Signed: 05/28/2016 5:52:39 PM By: Regan Lemming BSN, RN Killion, [name] ([id number]) Signed: 05/28/2016 6:11:07 PM By: [name] MD Entered By: [name] on 05/28/2016 12:50:38 [name] ([id number]) -------------------------------------------------------------------------------- HPI Details Patient Name: [name]. Date of Service: 05/28/2016 10:15 AM Medical Record Patient Account Number: [id number] Number: Treating RN: Baruch Gouty, RN, BSN, Rita June 06, 1961 (55 y.o. Other Clinician: Date of Birth/Sex: Male) Treating Gaven Eugene Primary Care Physician/Extender: Ali Lowe, ADAKU Physician: Referring Physician: Gavin Pound Weeks in Treatment: 11 History of Present Illness HPI Description: Pleasant 55 year old with history of diabetes (Hgb A1c 10.8 in 2014) and peripheral neuropathy. No PVD. L ABI 1.1. Status post right great toe partial amputation years ago. He was at work and on 10/22/2014, was injured by a cart, and suffered an ulceration to his left anterior calf. He says that it subsequently became infected, and he was treated with a course of antibiotics. He was found on initial exam to have an ulceration on the dorsum of his left third toe. He was unaware of this and attributes it to pressure from his steel toed boots. More recently he injured his right anterior calf on a cart. Ambulating normally per his baseline. He has been undergoing regular debridements, applying mupirocin cream, and an Ace wrap for edema control. He returns to clinic for follow-up and is without complaints. No pain. No fever or chills. No drainage. 10/25/15; this is a  55 year old man who has type II diabetes with diabetic polyneuropathy. He tells me that he fractured his left fifth metatarsal in June 2016 when he presented with swelling. He does not recall a specific injury. His hemoglobin A1c was apparently too high at the time for consideration of surgery and he was put in some form of offloading. Ultimately he went  to surgery in December with an allograft from his calcaneus to this site, plate and screws. He had an x-ray of the foot in March that showed concerns about nonunion. He tells me that in March he had to move and basically moved himself. He was on his foot a lot and then noticed some drainage from an open area. He has been following with his orthopedic surgeon Dr. Doran Durand. He has been applying a felt donut, dry dressing and using his heel healing sandal. 11/01/15; this is a patient I saw last week for the first time. He had a small open wound on the plantar aspect of his left foot at roughly the level of the base of his fifth metatarsal. He had a considerable degree of thickened skin around this wound on the plantar aspect which I thought was from chronic pressure on this area. He tells [place] that he had drainage over the course of the week. No systemic symptoms. 11/08/15; culture last week grew Citrobacter korseri. This should've been sensitive to the Augmentin I gave him. He has seen Dr. Doran Durand who did his initial surgery and according the patient the plan is to give this another month and then the hardware might need to come out of this. This seems like a reasonable plan. I will adjust his antibiotics to ciprofloxacin which probably should continue for at least another 2 weeks. I gave him 10 days worth today 11/22/15 the patient has completed antibiotics. He has an appointment with Dr. Doran Durand this Friday. There is improved dimensions around the wound on the left fifth metatarsal base 11/29/15; the patient has completed antibiotics last week. Apparently  his appointment with Dr. Doran Durand it is not until this Friday. Dimensions are roughly the same. 12/06/15; saw Dr. Doran Durand. No x-ray told the end of the month, next appointment June 30. We have been using Aquacel Ag 12/13/15: No major change this week. Using Aquacel AG KONGMENG, PULKRABEK. ([id number]) 621/17; arrived this week with maceration around the wound. There was quite a bit of undermining which required surgical debridement. I changed him to [facility] last week, by the patient's admission he was up on this more this week 12/27/15; macerated tissue around the wound is removed with a scalpel and pickups. There is no undermining. Nonviable subcutaneous tissue and skin taken from the superior circumference of the wound is slough from the surface. READMISSION 03/06/16 since I last saw this patient at the end of June, he went for surgery on 01/11/16 by Dr. Doran Durand of orthopedics. He had a left foot irrigation and debridement, removal of hardware and placement of wound VAC. He is also been followed by Dr. Megan Salon of infectious disease and completed a six-week course of IV Rocephin for group B strep and Enterobacter in the bone at the time of surgery. Apparently at the time of surgery the bone looked healthy so I don't think any bone was actually removed. He has been using silver alginate based dressings on the same wound area at the base of the left fifth metatarsal on the left. I note that he is also had arterial studies on 01/08/16, these showed a left ABI of 1.2 to and a right ABI of 1.3. Waveforms were listed as biphasic. He was not felt to have any specific arterial issues. 03/13/16; no real change in the condition of the wound at the left lateral foot at roughly the base of his fifth metatarsal. Use silver alginate last week. 03/18/16 arrives today with no open area. Being  suspicious of the overlying callus I. Some of this back although I see nothing but covering tissue here/epithelium. There is no  surrounding tenderness READMISSION 04/16/16 this is a patient I discharged about a month ago. Initially a surgical wound on the lateral aspect of his left foot which subsequently became infected. The story he is giving today that he went back into his own modified shoe started to notice pain 2 weeks ago he was seen in Dr. Nona Dell office by a physician assistant last Wednesday and according the patient was told that everything looked fine however this is clearly now broken open and he has an open wound in the same spot that we have been dealing with repetitively. Situation is complicated by the fact that he is running short of money on long-term disability. He has not taken his insulin and at least 2 weeks was previously on NovoLog short acting insulin on a sliding scale and TRESIBA 35 units at bedtime. He is no longer able to afford any of his medication he was in the x-ray on 10/15. A plain x-ray showed healed fracture of the left fifth metatarsal bone status post removal of the associated plate and screw fixation hardware. There was no acute appearing osseous abnormality. His blood work showed a white count of 9.2 with an essentially normal differential comprehensive metabolic panel was normal. Previous CT scan of the foot on 01/08/16 showed no osteomyelitis previous vascular workup showed no evidence of significant PAD on 01/13/16 04/23/16; culture I did last week grew enterococcus [ampicillin sensitive] and MRSA. He saw infectious disease yesterday. They stop the clindamycin and ordered an MRI. This is not unreasonable. All the hardware is out of the foot at this point. 04/30/16 at this point in time we are still awaiting the results of the MRI at this point in time. Patient did have an area which appears to be somewhat macerated in the proximal portion of the wound where there is overlying necrotic skin that is doing nothing more than trapping fluid underneath. He continues to state that he  does have some discomfort and again the concern is for the possibility of osteomyelitis hence the reason for the MRI order. We have been using a silver alginate dressing but again I think the reason this with his macerated as it was is that the dressing obscene could not reach the entirety of the wound due to some necrotic skin covering the proximal portion. No bleeding noted at this point in time on initial evaluation. 05/07/16 we receive the results of patient's MRI which shows that he has no evidence of osteomyelitis. This obviously is excellent news. Patient was definitely happy to hear this. He tells me the wound appears to be doing very well at this point in time and is pleased with the progress currently. MAHESH, SEVERS ([id number]) 05/15/16; as noted the patient did not have osteomyelitis. He has been released by infectious disease and orthopedics. His wound is still open he had a debridement last week but complain when he got home he "bleeding for half a day". He is not had any pain. We have been using silver alginate with Kerlix gauze wrap. 05/28/16; patient arrives today with the wound in much the same condition as last time. He has a small opening on the lateral aspect of his foot however with debridement there is clear undermining medially there is no real evidence of infection here and I didn't see any point in culturing this. One would have to wonder if  this isn't a simple matter of in adequate offloading as he is been using a healing sandal Electronic Signature(s) Signed: 05/28/2016 6:11:07 PM By: [name] MD Entered By: [name] on 05/28/2016 12:51:46 [name] ([id number]) -------------------------------------------------------------------------------- Physical Exam Details Patient Name: GUENTHER, GEILING. Date of Service: 05/28/2016 10:15 AM Medical Record Patient Account Number: [id number] Number: Treating RN: Baruch Gouty, RN, BSN, Rita [birth year]  (55 y.o. Other Clinician: Date of Birth/Sex: Male) Treating Amarissa Koerner Primary Care Physician/Extender: Ali Lowe, ADAKU Physician: Referring Physician: Gavin Pound Weeks in Treatment: 11 Constitutional Sitting or standing Blood Pressure is within target range for patient.. Pulse regular and within target range for patient.Marland Kitchen Respirations regular, non-labored and within target range.. Temperature is normal and within the target range for the patient.. Patient's appearance is neat and clean. Appears in no acute distress. Well nourished and well developed.. Notes Wound exam; the patient still has a small open area on the bottom of the right lateral edge. He has thick surrounding nonviable skin and subcutaneous tissue which requires debridement with a #3 curette. There was no bleeding. No evidence of infection. Electronic Signature(s) Signed: 05/28/2016 6:11:07 PM By: [name] MD Entered By: [name] on 05/28/2016 12:53:11 [name] ([id number]) -------------------------------------------------------------------------------- Physician Orders Details Patient Name: STEFEN, JAQUAY. Date of Service: 05/28/2016 10:15 AM Medical Record Patient Account Number: [id number] Number: Treating RN: Baruch Gouty, RN, BSN, Rita Jul 03, 1960 (55 y.o. Other Clinician: Date of Birth/Sex: Male) Treating Fawne Hughley Primary Care Physician/Extender: Ali Lowe, ADAKU Physician: Referring Physician: Judeth Horn in Treatment: 38 Verbal / Phone Orders: Yes Clinician: Afful, RN, BSN, Rita Read Back and Verified: Yes Diagnosis Coding Wound Cleansing Wound #6 Left,Lateral Foot o Cleanse wound with mild soap and water o May Shower, gently pat wound dry prior to applying new dressing. o May shower with protection. Anesthetic Wound #6 Left,Lateral Foot o Topical Lidocaine 4% cream applied to wound bed prior to debridement Primary Wound Dressing Wound #6  Left,Lateral Foot o Prisma Ag Secondary Dressing Wound #6 Left,Lateral Foot o Gauze and Kerlix/Conform Dressing Change Frequency Wound #6 Left,Lateral Foot o Change dressing every day. Follow-up Appointments Wound #6 Left,Lateral Foot o Return Appointment in 1 week. Off-Loading Wound #6 Left,Lateral Foot o Open toe surgical shoe with peg assist. o Other: - felt Additional Orders / Instructions NICHOL, EMMEL. ([id number]) Wound #6 Left,Lateral Foot o Increase protein intake. o Activity as tolerated Electronic Signature(s) Signed: 05/28/2016 5:52:39 PM By: Regan Lemming BSN, RN Signed: 05/28/2016 6:11:07 PM By: [name] MD Entered By: Regan Lemming on 05/28/2016 11:21:49 [name] ([id number]) -------------------------------------------------------------------------------- Problem List Details Patient Name: VERYL, AHLSTRAND. Date of Service: 05/28/2016 10:15 AM Medical Record Patient Account Number: [id number] Number: Treating RN: Baruch Gouty, RN, BSN, Rita 10-Jul-1960 (55 y.o. Other Clinician: Date of Birth/Sex: Male) Treating Raequon Catanzaro Primary Care Physician/Extender: Ali Lowe, Doreene Burke Physician: Referring Physician: Judeth Horn in Treatment: 11 Active Problems ICD-10 Encounter Code Description Active Date Diagnosis E11.621 Type 2 diabetes mellitus with foot ulcer 03/06/2016 Yes L97.523 Non-pressure chronic ulcer of other part of left foot with 03/06/2016 Yes necrosis of muscle M86.672 Other chronic osteomyelitis, left ankle and foot 03/06/2016 Yes Inactive Problems Resolved Problems Electronic Signature(s) Signed: 05/28/2016 6:11:07 PM By: [name] MD Entered By: [name] on 05/28/2016 12:50:07 [name] ([id number]) -------------------------------------------------------------------------------- Progress Note Details Patient Name: [name]. Date of Service: 05/28/2016 10:15 AM Medical Record  Patient Account Number: [id number] Number: Treating RN: Afful, RN,  BSN, Rita Apr 20, 1961 (55 y.o. Other Clinician: Date of Birth/Sex: Male) Treating Raiden Yearwood Primary Care Physician/Extender: Garlan Fair Physician: Referring Physician: Judeth Horn in Treatment: 11 Subjective Chief Complaint Information obtained from Patient 03/06/16; patient returns today after further surgery on the left foot as well as a 6 week course of IV antibiotics History of Present Illness (HPI) Pleasant 55 year old with history of diabetes (Hgb A1c 10.8 in 2014) and peripheral neuropathy. No PVD. L ABI 1.1. Status post right great toe partial amputation years ago. He was at work and on 10/22/2014, was injured by a cart, and suffered an ulceration to his left anterior calf. He says that it subsequently became infected, and he was treated with a course of antibiotics. He was found on initial exam to have an ulceration on the dorsum of his left third toe. He was unaware of this and attributes it to pressure from his steel toed boots. More recently he injured his right anterior calf on a cart. Ambulating normally per his baseline. He has been undergoing regular debridements, applying mupirocin cream, and an Ace wrap for edema control. He returns to clinic for follow-up and is without complaints. No pain. No fever or chills. No drainage. 10/25/15; this is a 55 year old man who has type II diabetes with diabetic polyneuropathy. He tells me that he fractured his left fifth metatarsal in June 2016 when he presented with swelling. He does not recall a specific injury. His hemoglobin A1c was apparently too high at the time for consideration of surgery and he was put in some form of offloading. Ultimately he went to surgery in December with an allograft from his calcaneus to this site, plate and screws. He had an x-ray of the foot in March that showed concerns about nonunion. He tells me that in  March he had to move and basically moved himself. He was on his foot a lot and then noticed some drainage from an open area. He has been following with his orthopedic surgeon Dr. Doran Durand. He has been applying a felt donut, dry dressing and using his heel healing sandal. 11/01/15; this is a patient I saw last week for the first time. He had a small open wound on the plantar aspect of his left foot at roughly the level of the base of his fifth metatarsal. He had a considerable degree of thickened skin around this wound on the plantar aspect which I thought was from chronic pressure on this area. He tells [place] that he had drainage over the course of the week. No systemic symptoms. 11/08/15; culture last week grew Citrobacter korseri. This should've been sensitive to the Augmentin I gave him. He has seen Dr. Doran Durand who did his initial surgery and according the patient the plan is to give this another month and then the hardware might need to come out of this. This seems like a reasonable plan. I will adjust his antibiotics to ciprofloxacin which probably should continue for at least another 2 weeks. I gave him 10 days worth today 11/22/15 the patient has completed antibiotics. He has an appointment with Dr. Doran Durand this Friday. There is TELLEY, BOICE ([id number]) improved dimensions around the wound on the left fifth metatarsal base 11/29/15; the patient has completed antibiotics last week. Apparently his appointment with Dr. Doran Durand it is not until this Friday. Dimensions are roughly the same. 12/06/15; saw Dr. Doran Durand. No x-ray told the end of the month, next appointment June 30. We have been using Aquacel Ag 12/13/15:  No major change this week. Using Aquacel AG 621/17; arrived this week with maceration around the wound. There was quite a bit of undermining which required surgical debridement. I changed him to [facility] last week, by the patient's admission he was up on this more this week 12/27/15;  macerated tissue around the wound is removed with a scalpel and pickups. There is no undermining. Nonviable subcutaneous tissue and skin taken from the superior circumference of the wound is slough from the surface. READMISSION 03/06/16 since I last saw this patient at the end of June, he went for surgery on 01/11/16 by Dr. Doran Durand of orthopedics. He had a left foot irrigation and debridement, removal of hardware and placement of wound VAC. He is also been followed by Dr. Megan Salon of infectious disease and completed a six-week course of IV Rocephin for group B strep and Enterobacter in the bone at the time of surgery. Apparently at the time of surgery the bone looked healthy so I don't think any bone was actually removed. He has been using silver alginate based dressings on the same wound area at the base of the left fifth metatarsal on the left. I note that he is also had arterial studies on 01/08/16, these showed a left ABI of 1.2 to and a right ABI of 1.3. Waveforms were listed as biphasic. He was not felt to have any specific arterial issues. 03/13/16; no real change in the condition of the wound at the left lateral foot at roughly the base of his fifth metatarsal. Use silver alginate last week. 03/18/16 arrives today with no open area. Being suspicious of the overlying callus I. Some of this back although I see nothing but covering tissue here/epithelium. There is no surrounding tenderness READMISSION 04/16/16 this is a patient I discharged about a month ago. Initially a surgical wound on the lateral aspect of his left foot which subsequently became infected. The story he is giving today that he went back into his own modified shoe started to notice pain 2 weeks ago he was seen in Dr. Nona Dell office by a physician assistant last Wednesday and according the patient was told that everything looked fine however this is clearly now broken open and he has an open wound in the same spot that we have  been dealing with repetitively. Situation is complicated by the fact that he is running short of money on long-term disability. He has not taken his insulin and at least 2 weeks was previously on NovoLog short acting insulin on a sliding scale and TRESIBA 35 units at bedtime. He is no longer able to afford any of his medication he was in the x-ray on 10/15. A plain x-ray showed healed fracture of the left fifth metatarsal bone status post removal of the associated plate and screw fixation hardware. There was no acute appearing osseous abnormality. His blood work showed a white count of 9.2 with an essentially normal differential comprehensive metabolic panel was normal. Previous CT scan of the foot on 01/08/16 showed no osteomyelitis previous vascular workup showed no evidence of significant PAD on 01/13/16 04/23/16; culture I did last week grew enterococcus [ampicillin sensitive] and MRSA. He saw infectious disease yesterday. They stop the clindamycin and ordered an MRI. This is not unreasonable. All the hardware is out of the foot at this point. 04/30/16 at this point in time we are still awaiting the results of the MRI at this point in time. Patient did have an area which appears to be somewhat  macerated in the proximal portion of the wound where there is overlying necrotic skin that is doing nothing more than trapping fluid underneath. He continues to state that he does have some discomfort and again the concern is for the possibility of osteomyelitis hence the reason TETSUO, BUROW. ([id number]) for the MRI order. We have been using a silver alginate dressing but again I think the reason this with his macerated as it was is that the dressing obscene could not reach the entirety of the wound due to some necrotic skin covering the proximal portion. No bleeding noted at this point in time on initial evaluation. 05/07/16 we receive the results of patient's MRI which shows that he has no evidence of  osteomyelitis. This obviously is excellent news. Patient was definitely happy to hear this. He tells me the wound appears to be doing very well at this point in time and is pleased with the progress currently. 05/15/16; as noted the patient did not have osteomyelitis. He has been released by infectious disease and orthopedics. His wound is still open he had a debridement last week but complain when he got home he "bleeding for half a day". He is not had any pain. We have been using silver alginate with Kerlix gauze wrap. 05/28/16; patient arrives today with the wound in much the same condition as last time. He has a small opening on the lateral aspect of his foot however with debridement there is clear undermining medially there is no real evidence of infection here and I didn't see any point in culturing this. One would have to wonder if this isn't a simple matter of in adequate offloading as he is been using a healing sandal Objective Constitutional Sitting or standing Blood Pressure is within target range for patient.. Pulse regular and within target range for patient.Marland Kitchen Respirations regular, non-labored and within target range.. Temperature is normal and within the target range for the patient.. Patient's appearance is neat and clean. Appears in no acute distress. Well nourished and well developed.. Vitals Time Taken: 10:58 AM, Height: 70 in, Weight: 260 lbs, BMI: 37.3, Temperature: 97.8 F, Pulse: 97 bpm, Respiratory Rate: 18 breaths/min, Blood Pressure: 143/79 mmHg. General Notes: Wound exam; the patient still has a small open area on the bottom of the right lateral edge. He has thick surrounding nonviable skin and subcutaneous tissue which requires debridement with a #3 curette. There was no bleeding. No evidence of infection. Integumentary (Hair, Skin) Wound #6 status is Open. Original cause of wound was Gradually Appeared. The wound is located on the Left,Lateral Foot. The wound  measures 0.8cm length x 0.3cm width x 0.2cm depth; 0.188cm^2 area and 0.038cm^3 volume. The wound is limited to skin breakdown. There is no tunneling or undermining noted. There is a large amount of serosanguineous drainage noted. The wound margin is thickened. There is large (67-100%) pink, pale granulation within the wound bed. There is a small (1-33%) amount of necrotic tissue within the wound bed including Adherent Slough. The periwound skin appearance exhibited: Callus, Moist. The periwound skin appearance did not exhibit: Crepitus, Excoriation, Fluctuance, Friable, Induration, Localized Edema, Rash, Scarring, Dry/Scaly, Maceration, Atrophie Blanche, Cyanosis, Ecchymosis, Hemosiderin Staining, Mottled, Pallor, Rubor, Erythema. Periwound temperature was noted as No Abnormality. The periwound has tenderness on palpation. 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 There was a Skin/Subcutaneous Tissue Debridement [id number]) debridement with total area of 0.24 sq cm performed by Ricard Dillon, MD. with the following instrument(s): Curette to remove Non-Viable tissue/material including Fibrin/Slough, Callus, and Subcutaneous after achieving pain control using Lidocaine 4% Topical Solution. A time out was conducted at 11:14, prior to the start of the procedure. A Minimum amount of bleeding was controlled with Pressure. The procedure was tolerated well with a pain level of 0 throughout and a pain level of 0 following the procedure. Post Debridement Measurements: 0.8cm length x 0.5cm width x 0.2cm depth; 0.063cm^3 volume. Character of Wound/Ulcer Post Debridement  requires further debridement. Severity of Tissue Post Debridement is: Fat layer exposed. Post procedure Diagnosis Wound #6: Same as Pre-Procedure Plan Wound Cleansing: Wound #6 Left,Lateral Foot: Cleanse wound with mild soap and water May Shower, gently pat wound dry prior to applying new dressing. May shower with protection. Anesthetic: Wound #6 Left,Lateral Foot: Topical Lidocaine 4% cream applied to wound bed prior to debridement Primary Wound Dressing: Wound #6 Left,Lateral Foot: Prisma Ag [name] ([id number]) Secondary Dressing: Wound #6 Left,Lateral Foot: Gauze and Kerlix/Conform Dressing Change Frequency: Wound #6 Left,Lateral Foot: Change dressing every day. Follow-up Appointments: Wound #6 Left,Lateral Foot: Return Appointment in 1 week. Off-Loading: Wound #6 Left,Lateral Foot: Open toe surgical shoe with peg assist. Other: - felt Additional Orders / Instructions: Wound #6 Left,Lateral Foot: Increase protein intake. Activity as tolerated I have changed the dressing to prisma Consider TCC if this is not consideralbe better by next week Electronic Signature(s) Signed: 05/28/2016 5:50:18 PM By: Gretta Cool RN, BSN, Kim RN, BSN Signed: 05/28/2016 6:11:07 PM By: [name] MD Entered By: Gretta Cool, RN, BSN, Kim on 05/28/2016 17:50:18 [name] ([id number]) -------------------------------------------------------------------------------- [place] Details Patient Name: DESTRY, DAWN. Date of Service: 05/28/2016 Medical Record Patient Account Number: [id number] Number: Treating RN: Baruch Gouty, RN, BSN, Rita 1961-01-06 (55 y.o. Other Clinician: Date of Birth/Sex: Male) Treating Trigg Delarocha, [place] Primary Care Physician: Gavin Pound Physician/Extender: G Referring Physician: Judeth Horn in Treatment: 11 Diagnosis Coding ICD-10 Codes Code Description E11.621 Type 2 diabetes mellitus with foot ulcer L97.523 Non-pressure chronic ulcer of  other part of left foot with necrosis of muscle M86.672 Other chronic osteomyelitis, left ankle and foot Facility Procedures CPT4 Code Description: [id number] - DEB SUBQ TISSUE 20 SQ CM/< ICD-10 Description Diagnosis E11.621 Type 2 diabetes mellitus with foot ulcer L97.523 Non-pressure chronic ulcer of other part of left foo Modifier: t with necrosi Quantity: 1 s of muscle Physician Procedures CPT4 Code Description: [id number] - WC PHYS SUBQ TISS 20 SQ CM ICD-10 Description Diagnosis E11.621 Type 2 diabetes mellitus with foot ulcer L97.523 Non-pressure chronic ulcer of other part of left foo Modifier: t with necrosis Quantity: 1 of muscle Electronic Signature(s) Signed: 05/28/2016 6:11:07 PM By: [name] MD Entered By: [name] on 05/28/2016 12:57:45

## 2016-05-30 ENCOUNTER — Ambulatory Visit: Payer: Managed Care, Other (non HMO) | Admitting: Endocrinology

## 2016-06-05 ENCOUNTER — Encounter: Payer: Managed Care, Other (non HMO) | Attending: Internal Medicine | Admitting: Internal Medicine

## 2016-06-05 DIAGNOSIS — E1142 Type 2 diabetes mellitus with diabetic polyneuropathy: Secondary | ICD-10-CM | POA: Insufficient documentation

## 2016-06-05 DIAGNOSIS — E11621 Type 2 diabetes mellitus with foot ulcer: Secondary | ICD-10-CM | POA: Insufficient documentation

## 2016-06-05 DIAGNOSIS — L97523 Non-pressure chronic ulcer of other part of left foot with necrosis of muscle: Secondary | ICD-10-CM | POA: Insufficient documentation

## 2016-06-06 NOTE — Progress Notes (Signed)
SACRAMENTO, VESCOVI ([id number]) Visit Report for 06/05/2016 Arrival Information Details Patient Name: [name], [name]. Date of Service: 06/05/2016 10:15 AM Medical Record Patient Account Number: [id number] Number: Treating RN: Baruch Gouty, RN, BSN, Rita 01/29/1961 (55 y.o. Other Clinician: Date of Birth/Sex: Male) Treating ROBSON, MICHAEL Primary Care Physician: NNODI, ADAKU Physician/Extender: G Referring Physician: Judeth Horn in Treatment: 13 Visit Information History Since Last Visit All ordered tests and consults were completed: No Patient Arrived: Ambulatory Added or deleted any medications: No Arrival Time: 10:30 Any new allergies or adverse reactions: No Accompanied By: SELF Had a fall or experienced change in No Transfer Assistance: None activities of daily living that may affect Patient Identification Verified: Yes risk of falls: Secondary Verification Process Yes Signs or symptoms of abuse/neglect since last No Completed: visito Patient Requires Transmission-Based No Hospitalized since last visit: No Precautions: Has Dressing in Place as Prescribed: Yes Patient Has Alerts: Yes Pain Present Now: No Patient Alerts: DM II Electronic Signature(s) Signed: 06/05/2016 4:48:54 PM By: Regan Lemming BSN, RN Entered By: Regan Lemming on 06/05/2016 10:32:20 [name] ([id number]) -------------------------------------------------------------------------------- Encounter Discharge Information Details Patient Name: [name], [name]. Date of Service: 06/05/2016 10:15 AM Medical Record Patient Account Number: [id number] Number: Treating RN: Baruch Gouty, RN, BSN, Rita [birth year] (55 y.o. Other Clinician: Date of Birth/Sex: Male) Treating ROBSON, MICHAEL Primary Care Physician: Gavin Pound Physician/Extender: G Referring Physician: Judeth Horn in Treatment: 13 Encounter Discharge Information Items Discharge Pain Level: 0 Discharge Condition:  Stable Ambulatory Status: Ambulatory Discharge Destination: Home Transportation: Private Auto Accompanied By: self Schedule Follow-up Appointment: No Medication Reconciliation completed and provided to Patient/Care No Siara Gorder: Provided on Clinical Summary of Care: 06/05/2016 Form Type Recipient Paper Patient HP Electronic Signature(s) Signed: 06/05/2016 4:18:47 PM By: Regan Lemming BSN, RN Previous Signature: 06/05/2016 11:55:41 AM Version By: Ruthine Dose Entered By: Regan Lemming on 06/05/2016 16:18:47 [name] ([id number]) -------------------------------------------------------------------------------- Lower Extremity Assessment Details Patient Name: [name]. Date of Service: 06/05/2016 10:15 AM Medical Record Patient Account Number: [id number] Number: Treating RN: Baruch Gouty, RN, BSN, Rita 1960-09-23 (55 y.o. Other Clinician: Date of Birth/Sex: Male) Treating ROBSON, MICHAEL Primary Care Physician: Gavin Pound Physician/Extender: G Referring Physician: Gavin Pound Weeks in Treatment: 13 Vascular Assessment Pulses: Posterior Tibial Dorsalis Pedis Palpable: [Left:Yes] Extremity colors, hair growth, and conditions: Extremity Color: [Left:Normal] Hair Growth on Extremity: [Left:Yes] Temperature of Extremity: [Left:Warm] Toe Nail Assessment Left: Right: Thick: Yes Discolored: Yes Deformed: No Improper Length and Hygiene: No Electronic Signature(s) Signed: 06/05/2016 4:48:54 PM By: Regan Lemming BSN, RN Entered By: Regan Lemming on 06/05/2016 10:33:54 [name] ([id number]) -------------------------------------------------------------------------------- Multi Wound Chart Details Patient Name: [name]. Date of Service: 06/05/2016 10:15 AM Medical Record Patient Account Number: [id number] Number: Treating RN: Baruch Gouty, RN, BSN, Rita [birth year] (55 y.o. Other Clinician: Date of Birth/Sex: Male) Treating ROBSON, MICHAEL Primary  Care Physician: NNODI, Doreene Burke Physician/Extender: G Referring Physician: Gavin Pound Weeks in Treatment: 13 Vital Signs Height(in): 70 Pulse(bpm): 95 Weight(lbs): 260 Blood Pressure 150/95 (mmHg): Body Mass Index(BMI): 37 Temperature(F): 98.0 Respiratory Rate 18 (breaths/min): Photos: [6:No Photos] [N/A:N/A] Wound Location: [6:Left Foot - Lateral] [N/A:N/A] Wounding Event: [6:Gradually Appeared] [N/A:N/A] Primary Etiology: [6:Diabetic Wound/Ulcer of the Lower Extremity] [N/A:N/A] Comorbid History: [6:Hypertension, Type II Diabetes, Neuropathy] [N/A:N/A] Date Acquired: [6:03/25/2016] [N/A:N/A] Weeks of Treatment: [6:7] [N/A:N/A] Wound Status: [6:Open] [N/A:N/A] Measurements L x W x D 0.5x0.3x0.5 [N/A:N/A] (cm) Area (cm) : [6:0.118] [N/A:N/A] Volume (cm) : [6:0.059] [N/A:N/A] % Reduction in Area: [6:97.50%] [N/A:N/A] %  Reduction in Volume: 93.70% [N/A:N/A] Classification: [6:Grade 1] [N/A:N/A] Exudate Amount: [6:Large] [N/A:N/A] Exudate Type: [6:Serosanguineous] [N/A:N/A] Exudate Color: [6:red, brown] [N/A:N/A] Wound Margin: [6:Thickened] [N/A:N/A] Granulation Amount: [6:Large (67-100%)] [N/A:N/A] Granulation Quality: [6:Pink, Pale] [N/A:N/A] Necrotic Amount: [6:Small (1-33%)] [N/A:N/A] Exposed Structures: [6:Fascia: No Fat: No Tendon: No Muscle: No] [N/A:N/A] Joint: No Bone: No Limited to Skin Breakdown Epithelialization: Small (1-33%) N/A N/A Periwound Skin Texture: Callus: Yes N/A N/A Edema: No Excoriation: No Induration: No Crepitus: No Fluctuance: No Friable: No Rash: No Scarring: No Periwound Skin Moist: Yes N/A N/A Moisture: Maceration: No Dry/Scaly: No Periwound Skin Color: Atrophie Blanche: No N/A N/A Cyanosis: No Ecchymosis: No Erythema: No Hemosiderin Staining: No Mottled: No Pallor: No Rubor: No Temperature: No Abnormality N/A N/A Tenderness on Yes N/A N/A Palpation: Wound Preparation: Ulcer Cleansing: N/A N/A Rinsed/Irrigated  with Saline Topical Anesthetic Applied: Other: Lidocaine 4% Treatment Notes Electronic Signature(s) Signed: 06/05/2016 4:48:54 PM By: Regan Lemming BSN, RN Entered By: Regan Lemming on 06/05/2016 10:37:49 [name] ([id number]) -------------------------------------------------------------------------------- [place] Details Patient Name: [name], [name]. Date of Service: 06/05/2016 10:15 AM Medical Record Patient Account Number: [id number] Number: Treating RN: Baruch Gouty, RN, BSN, Rita January 07, 1961 (55 y.o. Other Clinician: Date of Birth/Sex: Male) Treating ROBSON, [place] Primary Care Physician: Gavin Pound Physician/Extender: G Referring Physician: Judeth Horn in Treatment: 13 Active Inactive Orientation to the Wound Care Program Nursing Diagnoses: Knowledge deficit related to the wound healing center program Goals: Patient/caregiver will verbalize understanding of the [facility] Program Date Initiated: 04/16/2016 Goal Status: Active Interventions: Provide education on orientation to the wound center Notes: Wound/Skin Impairment Nursing Diagnoses: Impaired tissue integrity Knowledge deficit related to ulceration/compromised skin integrity Goals: Patient/caregiver will verbalize understanding of skin care regimen Date Initiated: 04/16/2016 Goal Status: Active Ulcer/skin breakdown will have a volume reduction of 30% by week 4 Date Initiated: 04/16/2016 Goal Status: Active Ulcer/skin breakdown will have a volume reduction of 50% by week 8 Date Initiated: 04/16/2016 Goal Status: Active Ulcer/skin breakdown will have a volume reduction of 80% by week 12 Date Initiated: 04/16/2016 Goal Status: Active Ulcer/skin breakdown will heal within 14 weeks [name], [name] ([id number]) Date Initiated: 04/16/2016 Goal Status: Active Interventions: Assess patient/caregiver ability to obtain necessary supplies Assess patient/caregiver  ability to perform ulcer/skin care regimen upon admission and as needed Assess ulceration(s) every visit Provide education on ulcer and skin care Treatment Activities: Skin care regimen initiated : 03/06/2016 Topical wound management initiated : 03/06/2016 Notes: Electronic Signature(s) Signed: 06/05/2016 4:48:54 PM By: Regan Lemming BSN, RN Entered By: Regan Lemming on 06/05/2016 10:37:42 [name] ([id number]) -------------------------------------------------------------------------------- Pain Assessment Details Patient Name: [name]. Date of Service: 06/05/2016 10:15 AM Medical Record Patient Account Number: [id number] Number: Treating RN: Baruch Gouty, RN, BSN, Rita [birth year] (55 y.o. Other Clinician: Date of Birth/Sex: Male) Treating ROBSON, MICHAEL Primary Care Physician: Gavin Pound Physician/Extender: G Referring Physician: Judeth Horn in Treatment: 13 Active Problems Location of Pain Severity and Description of Pain Patient Has Paino No Site Locations With Dressing Change: No Pain Management and Medication Current Pain Management: Electronic Signature(s) Signed: 06/05/2016 4:48:54 PM By: Regan Lemming BSN, RN Entered By: Regan Lemming on 06/05/2016 10:32:28 [name] ([id number]) -------------------------------------------------------------------------------- Patient/Caregiver Education Details Patient Name: [name], [name]. Date of Service: 06/05/2016 10:15 AM Medical Record Patient Account Number: [id number] Number: Treating RN: Baruch Gouty, RN, BSN, Rita 21-Feb-1961 (55 y.o. Other Clinician: Date of Birth/Gender: Male) Treating ROBSON, MICHAEL Primary Care Physician: Gavin Pound Physician/Extender: G Referring  Physician: Judeth Horn in Treatment: 13 Education Assessment Education Provided To: Patient Education Topics Provided Welcome To [facility]: Methods: Explain/Verbal Wound/Skin Impairment: Methods:  Explain/Verbal Responses: State content correctly Electronic Signature(s) Signed: 06/05/2016 4:48:54 PM By: Regan Lemming BSN, RN Entered By: Regan Lemming on 06/05/2016 16:19:02 [name] ([id number]) -------------------------------------------------------------------------------- Wound Assessment Details Patient Name: [name]. Date of Service: 06/05/2016 10:15 AM Medical Record Patient Account Number: [id number] Number: Treating RN: Baruch Gouty, RN, BSN, Rita 1961-04-09 (55 y.o. Other Clinician: Date of Birth/Sex: Male) Treating ROBSON, [place] Primary Care Physician: Gavin Pound Physician/Extender: G Referring Physician: Gavin Pound Weeks in Treatment: 13 Wound Status Wound Number: 6 Primary Diabetic Wound/Ulcer of the Lower Etiology: Extremity Wound Location: Left, Lateral Foot Wound Status: Open Wounding Event: Gradually Appeared Comorbid Hypertension, Type II Diabetes, Date Acquired: 03/25/2016 History: Neuropathy Weeks Of Treatment: 7 Clustered Wound: No Photos Photo Uploaded By: Regan Lemming on 06/05/2016 16:48:17 Wound Measurements Length: (cm) 1 Width: (cm) 1 Depth: (cm) 0.5 Area: (cm) 0.785 Volume: (cm) 0.393 % Reduction in Area: 83.3% % Reduction in Volume: 58.3% Epithelialization: Small (1-33%) Tunneling: No Undermining: No Wound Description Classification: Grade 1 Foul Odor Aft Wound Margin: Thickened Exudate Amount: Large [name], [name] ([id number]) er Cleansing: No Exudate Type: Serosanguineous Exudate Color: red, brown Wound Bed Granulation Amount: Large (67-100%) Exposed Structure Granulation Quality: Pink, Pale Fascia Exposed: No Necrotic Amount: Small (1-33%) Fat Layer Exposed: No Necrotic Quality: Adherent Slough Tendon Exposed: No Muscle Exposed: No Joint Exposed: No Bone Exposed: No Limited to Skin Breakdown Periwound Skin Texture Texture Color No Abnormalities Noted: No No Abnormalities Noted: No Callus:  Yes Atrophie Blanche: No Crepitus: No Cyanosis: No Excoriation: No Ecchymosis: No Fluctuance: No Erythema: No Friable: No Hemosiderin Staining: No Induration: No Mottled: No Localized Edema: No Pallor: No Rash: No Rubor: No Scarring: No Temperature / Pain Moisture Temperature: No Abnormality No Abnormalities Noted: No Tenderness on Palpation: Yes Dry / Scaly: No Maceration: No Moist: Yes Wound Preparation Ulcer Cleansing: Rinsed/Irrigated with Saline Topical Anesthetic Applied: Other: Lidocaine 4%, Treatment Notes Wound #6 (Left, Lateral Foot) 1. Cleansed with: Clean wound with Normal Saline 4. Dressing Applied: Prisma Ag 5. Secondary Dressing Applied Dry Gauze Foam 6. Footwear/Offloading device applied Felt/Foam [name], [name] ([id number]) Notes TCC Electronic Signature(s) Signed: 06/05/2016 4:48:54 PM By: Regan Lemming BSN, RN Entered By: Regan Lemming on 06/05/2016 10:58:21 [name] ([id number]) -------------------------------------------------------------------------------- Vitals Details Patient Name: [name]. Date of Service: 06/05/2016 10:15 AM Medical Record Patient Account Number: [id number] Number: Treating RN: Baruch Gouty, RN, BSN, Rita [birth year] (55 y.o. Other Clinician: Date of Birth/Sex: Male) Treating ROBSON, MICHAEL Primary Care Physician: NNODI, ADAKU Physician/Extender: G Referring Physician: Gavin Pound Weeks in Treatment: 13 Vital Signs Time Taken: 10:32 Temperature (F): 98.0 Height (in): 70 Pulse (bpm): 95 Weight (lbs): 260 Respiratory Rate (breaths/min): 18 Body Mass Index (BMI): 37.3 Blood Pressure (mmHg): 150/95 Reference Range: 80 - 120 mg / dl Electronic Signature(s) Signed: 06/05/2016 4:48:54 PM By: Regan Lemming BSN, RN Entered By: Regan Lemming on 06/05/2016 10:33:17

## 2016-06-06 NOTE — Progress Notes (Addendum)
GUYTON, MARESCA ([id number]) Visit Report for 06/05/2016 Chief Complaint Document Details Patient Name: [name], [name]. Date of Service: 06/05/2016 10:15 AM Medical Record Patient Account Number: [id number] Number: Treating RN: Baruch Gouty, RN, BSN, Rita [birth year] (55 y.o. Other Clinician: Date of Birth/Sex: Male) Treating Lavanda Nevels Primary Care Physician/Extender: Garlan Fair Physician: Referring Physician: Judeth Horn in Treatment: 13 Information Obtained from: Patient Chief Complaint 03/06/16; patient returns today after further surgery on the left foot as well as a 6 week course of IV antibiotics Electronic Signature(s) Signed: 06/05/2016 4:39:43 PM By: [name] MD Entered By: [name] on 06/05/2016 12:49:15 [name] ([id number]) -------------------------------------------------------------------------------- Debridement Details Patient Name: [name]. Date of Service: 06/05/2016 10:15 AM Medical Record Patient Account Number: [id number] Number: Treating RN: Baruch Gouty, RN, BSN, Rita May 25, 1961 (55 y.o. Other Clinician: Date of Birth/Sex: Male) Treating Malvina Schadler Primary Care Physician/Extender: Ali Lowe, ADAKU Physician: Referring Physician: Judeth Horn in Treatment: 13 Debridement Performed for Wound #6 Left,Lateral Foot Assessment: Performed By: Physician Ricard Dillon, MD Debridement: Debridement Pre-procedure Yes - 10:50 Verification/Time Out Taken: Start Time: 10:50 Pain Control: Lidocaine 4% Topical Solution Level: Skin/Subcutaneous Tissue Total Area Debrided (L x 0.5 (cm) x 0.3 (cm) = 0.15 (cm) W): Tissue and other Non-Viable, Callus, Fibrin/Slough, Subcutaneous material debrided: Instrument: Curette Bleeding: Minimum Hemostasis Achieved: Pressure End Time: 10:53 Procedural Pain: 0 Post Procedural Pain: 0 Response to Treatment: Procedure was tolerated well Post Debridement  Measurements of Total Wound Length: (cm) 1 Width: (cm) 1 Depth: (cm) 0.2 Volume: (cm) 0.157 Character of Wound/Ulcer Post Requires Further Debridement Debridement: Severity of Tissue Post Debridement: Fat layer exposed Post Procedure Diagnosis Same as Pre-procedure Electronic Signature(s) Signed: 06/05/2016 4:39:43 PM By: [name] MD [name] ([id number]) Signed: 06/05/2016 4:48:54 PM By: Regan Lemming BSN, RN Entered By: [name] on 06/05/2016 12:49:02 [name] ([id number]) -------------------------------------------------------------------------------- HPI Details Patient Name: [name], [name]. Date of Service: 06/05/2016 10:15 AM Medical Record Patient Account Number: [id number] Number: Treating RN: Baruch Gouty, RN, BSN, Rita [birth year] (55 y.o. Other Clinician: Date of Birth/Sex: Male) Treating Marquee Fuchs Primary Care Physician/Extender: Ali Lowe, ADAKU Physician: Referring Physician: Gavin Pound Weeks in Treatment: 13 History of Present Illness HPI Description: Pleasant 55 year old with history of diabetes (Hgb A1c 10.8 in 2014) and peripheral neuropathy. No PVD. L ABI 1.1. Status post right great toe partial amputation years ago. He was at work and on 10/22/2014, was injured by a cart, and suffered an ulceration to his left anterior calf. He says that it subsequently became infected, and he was treated with a course of antibiotics. He was found on initial exam to have an ulceration on the dorsum of his left third toe. He was unaware of this and attributes it to pressure from his steel toed boots. More recently he injured his right anterior calf on a cart. Ambulating normally per his baseline. He has been undergoing regular debridements, applying mupirocin cream, and an Ace wrap for edema control. He returns to clinic for follow-up and is without complaints. No pain. No fever or chills. No drainage. 10/25/15; this is a 55 year old man  who has type II diabetes with diabetic polyneuropathy. He tells me that he fractured his left fifth metatarsal in June 2016 when he presented with swelling. He does not recall a specific injury. His hemoglobin A1c was apparently too high at the time for consideration of surgery and he was put in some form of offloading. Ultimately he went  to surgery in December with an allograft from his calcaneus to this site, plate and screws. He had an x-ray of the foot in March that showed concerns about nonunion. He tells me that in March he had to move and basically moved himself. He was on his foot a lot and then noticed some drainage from an open area. He has been following with his orthopedic surgeon Dr. Doran Durand. He has been applying a felt donut, dry dressing and using his heel healing sandal. 11/01/15; this is a patient I saw last week for the first time. He had a small open wound on the plantar aspect of his left foot at roughly the level of the base of his fifth metatarsal. He had a considerable degree of thickened skin around this wound on the plantar aspect which I thought was from chronic pressure on this area. He tells [place] that he had drainage over the course of the week. No systemic symptoms. 11/08/15; culture last week grew Citrobacter korseri. This should've been sensitive to the Augmentin I gave him. He has seen Dr. Doran Durand who did his initial surgery and according the patient the plan is to give this another month and then the hardware might need to come out of this. This seems like a reasonable plan. I will adjust his antibiotics to ciprofloxacin which probably should continue for at least another 2 weeks. I gave him 10 days worth today 11/22/15 the patient has completed antibiotics. He has an appointment with Dr. Doran Durand this Friday. There is improved dimensions around the wound on the left fifth metatarsal base 11/29/15; the patient has completed antibiotics last week. Apparently his appointment  with Dr. Doran Durand it is not until this Friday. Dimensions are roughly the same. 12/06/15; saw Dr. Doran Durand. No x-ray told the end of the month, next appointment June 30. We have been using Aquacel Ag 12/13/15: No major change this week. Using Aquacel AG NATASHA, MOIX. ([id number]) 621/17; arrived this week with maceration around the wound. There was quite a bit of undermining which required surgical debridement. I changed him to [facility] last week, by the patient's admission he was up on this more this week 12/27/15; macerated tissue around the wound is removed with a scalpel and pickups. There is no undermining. Nonviable subcutaneous tissue and skin taken from the superior circumference of the wound is slough from the surface. READMISSION 03/06/16 since I last saw this patient at the end of June, he went for surgery on 01/11/16 by Dr. Doran Durand of orthopedics. He had a left foot irrigation and debridement, removal of hardware and placement of wound VAC. He is also been followed by Dr. Megan Salon of infectious disease and completed a six-week course of IV Rocephin for group B strep and Enterobacter in the bone at the time of surgery. Apparently at the time of surgery the bone looked healthy so I don't think any bone was actually removed. He has been using silver alginate based dressings on the same wound area at the base of the left fifth metatarsal on the left. I note that he is also had arterial studies on 01/08/16, these showed a left ABI of 1.2 to and a right ABI of 1.3. Waveforms were listed as biphasic. He was not felt to have any specific arterial issues. 03/13/16; no real change in the condition of the wound at the left lateral foot at roughly the base of his fifth metatarsal. Use silver alginate last week. 03/18/16 arrives today with no open area. Being  suspicious of the overlying callus I. Some of this back although I see nothing but covering tissue here/epithelium. There is no surrounding  tenderness READMISSION 04/16/16 this is a patient I discharged about a month ago. Initially a surgical wound on the lateral aspect of his left foot which subsequently became infected. The story he is giving today that he went back into his own modified shoe started to notice pain 2 weeks ago he was seen in Dr. Nona Dell office by a physician assistant last Wednesday and according the patient was told that everything looked fine however this is clearly now broken open and he has an open wound in the same spot that we have been dealing with repetitively. Situation is complicated by the fact that he is running short of money on long-term disability. He has not taken his insulin and at least 2 weeks was previously on NovoLog short acting insulin on a sliding scale and TRESIBA 35 units at bedtime. He is no longer able to afford any of his medication he was in the x-ray on 10/15. A plain x-ray showed healed fracture of the left fifth metatarsal bone status post removal of the associated plate and screw fixation hardware. There was no acute appearing osseous abnormality. His blood work showed a white count of 9.2 with an essentially normal differential comprehensive metabolic panel was normal. Previous CT scan of the foot on 01/08/16 showed no osteomyelitis previous vascular workup showed no evidence of significant PAD on 01/13/16 04/23/16; culture I did last week grew enterococcus [ampicillin sensitive] and MRSA. He saw infectious disease yesterday. They stop the clindamycin and ordered an MRI. This is not unreasonable. All the hardware is out of the foot at this point. 04/30/16 at this point in time we are still awaiting the results of the MRI at this point in time. Patient did have an area which appears to be somewhat macerated in the proximal portion of the wound where there is overlying necrotic skin that is doing nothing more than trapping fluid underneath. He continues to state that he does have  some discomfort and again the concern is for the possibility of osteomyelitis hence the reason for the MRI order. We have been using a silver alginate dressing but again I think the reason this with his macerated as it was is that the dressing obscene could not reach the entirety of the wound due to some necrotic skin covering the proximal portion. No bleeding noted at this point in time on initial evaluation. 05/07/16 we receive the results of patient's MRI which shows that he has no evidence of osteomyelitis. This obviously is excellent news. Patient was definitely happy to hear this. He tells me the wound appears to be doing very well at this point in time and is pleased with the progress currently. [name], [name] ([id number]) 05/15/16; as noted the patient did not have osteomyelitis. He has been released by infectious disease and orthopedics. His wound is still open he had a debridement last week but complain when he got home he "bleeding for half a day". He is not had any pain. We have been using silver alginate with Kerlix gauze wrap. 05/28/16; patient arrives today with the wound in much the same condition as last time. He has a small opening on the lateral aspect of his foot however with debridement there is clear undermining medially there is no real evidence of infection here and I didn't see any point in culturing this. One would have to wonder if  this isn't a simple matter of in adequate offloading as he is been using a healing sandal. 06/05/16; the open area is now on the plantar aspect of his foot and not decide. The wound almost appears to have "migrated". This was the term use by our intake nurse. Electronic Signature(s) Signed: 06/05/2016 4:39:43 PM By: [name] MD Entered By: [name] on 06/05/2016 12:50:24 [name] ([id number]) -------------------------------------------------------------------------------- Physical Exam Details Patient Name: [name], [name]. Date of Service: 06/05/2016 10:15 AM Medical Record Patient Account Number: [id number] Number: Treating RN: Baruch Gouty, RN, BSN, Rita 04-12-1961 (55 y.o. Other Clinician: Date of Birth/Sex: Male) Treating Rebecka Oelkers Primary Care Physician/Extender: Ali Lowe, ADAKU Physician: Referring Physician: Gavin Pound Weeks in Treatment: 52 Constitutional Patient is hypertensive.. Pulse regular and within target range for patient.Marland Kitchen Respirations regular, non-labored and within target range.. Temperature is normal and within the target range for the patient.. Patient's appearance is neat and clean. Appears in no acute distress. Well nourished and well developed.. Notes Exam; the patient continues to have an open area now on the plantar aspect of his foot rather than the lateral edge of the foot. He has thick surrounding callus skin over I did not initiate any debridement of this the wound itself was debrided of callus skin and subcutaneous tissue to fully expose the circumference of the wound. This was done with a #3 curet. Hemostasis with direct pressure. He tolerated this well Electronic Signature(s) Signed: 06/05/2016 4:39:43 PM By: [name] MD Entered By: [name] on 06/05/2016 12:51:35 [name] ([id number]) -------------------------------------------------------------------------------- Physician Orders Details Patient Name: [name], [name]. Date of Service: 06/05/2016 10:15 AM Medical Record Patient Account Number: [id number] Number: Treating RN: Baruch Gouty, RN, BSN, Rita 12-08-1960 (55 y.o. Other Clinician: Date of Birth/Sex: Male) Treating Ignace Mandigo Primary Care Physician/Extender: Ali Lowe, ADAKU Physician: Referring Physician: Judeth Horn in Treatment: 83 Verbal / Phone Orders: Yes Clinician: Afful, RN, BSN, Rita Read Back and Verified: Yes Diagnosis Coding Wound Cleansing Wound #6 Left,Lateral Foot o Cleanse wound with  mild soap and water o May Shower, gently pat wound dry prior to applying new dressing. o May shower with protection. Anesthetic Wound #6 Left,Lateral Foot o Topical Lidocaine 4% cream applied to wound bed prior to debridement Primary Wound Dressing Wound #6 Left,Lateral Foot o Prisma Ag Secondary Dressing Wound #6 Left,Lateral Foot o Gauze and Kerlix/Conform Dressing Change Frequency Wound #6 Left,Lateral Foot o Change dressing every day. Follow-up Appointments Wound #6 Left,Lateral Foot o Return Appointment in 1 week. Off-Loading Wound #6 Left,Lateral Foot o Total Contact Cast to Left Lower Extremity o Other: - felt Additional Orders / Instructions JOSHAUN, NANNINI ([id number]) Wound #6 Left,Lateral Foot o Increase protein intake. o Activity as tolerated Electronic Signature(s) Signed: 06/05/2016 4:39:43 PM By: [name] MD Signed: 06/05/2016 4:48:54 PM By: Regan Lemming BSN, RN Entered By: Regan Lemming on 06/05/2016 10:54:17 [name] ([id number]) -------------------------------------------------------------------------------- Problem List Details Patient Name: [name], [name]. Date of Service: 06/05/2016 10:15 AM Medical Record Patient Account Number: [id number] Number: Treating RN: Baruch Gouty, RN, BSN, Rita September 12, 1960 (55 y.o. Other Clinician: Date of Birth/Sex: Male) Treating Green Quincy Primary Care Physician/Extender: Garlan Fair Physician: Referring Physician: Judeth Horn in Treatment: 13 Active Problems ICD-10 Encounter Code Description Active Date Diagnosis E11.621 Type 2 diabetes mellitus with foot ulcer 03/06/2016 Yes L97.523 Non-pressure chronic ulcer of other part of left foot with 03/06/2016 Yes necrosis of muscle Inactive Problems ICD-10 Code Description Active Date  Inactive Date M86.672 Other chronic osteomyelitis, left ankle and foot 03/06/2016 03/06/2016 Resolved Problems Electronic  Signature(s) Signed: 06/05/2016 4:39:43 PM By: [name] MD Entered By: [name] on 06/05/2016 12:54:06 [name] ([id number]) -------------------------------------------------------------------------------- Progress Note Details Patient Name: [name]. Date of Service: 06/05/2016 10:15 AM Medical Record Patient Account Number: [id number] Number: Treating RN: Baruch Gouty, RN, BSN, Rita September 13, 1960 (55 y.o. Other Clinician: Date of Birth/Sex: Male) Treating Jaykub Mackins Primary Care Physician/Extender: Garlan Fair Physician: Referring Physician: Judeth Horn in Treatment: 13 Subjective Chief Complaint Information obtained from Patient 03/06/16; patient returns today after further surgery on the left foot as well as a 6 week course of IV antibiotics History of Present Illness (HPI) Pleasant 55 year old with history of diabetes (Hgb A1c 10.8 in 2014) and peripheral neuropathy. No PVD. L ABI 1.1. Status post right great toe partial amputation years ago. He was at work and on 10/22/2014, was injured by a cart, and suffered an ulceration to his left anterior calf. He says that it subsequently became infected, and he was treated with a course of antibiotics. He was found on initial exam to have an ulceration on the dorsum of his left third toe. He was unaware of this and attributes it to pressure from his steel toed boots. More recently he injured his right anterior calf on a cart. Ambulating normally per his baseline. He has been undergoing regular debridements, applying mupirocin cream, and an Ace wrap for edema control. He returns to clinic for follow-up and is without complaints. No pain. No fever or chills. No drainage. 10/25/15; this is a 55 year old man who has type II diabetes with diabetic polyneuropathy. He tells me that he fractured his left fifth metatarsal in June 2016 when he presented with swelling. He does not recall a specific injury.  His hemoglobin A1c was apparently too high at the time for consideration of surgery and he was put in some form of offloading. Ultimately he went to surgery in December with an allograft from his calcaneus to this site, plate and screws. He had an x-ray of the foot in March that showed concerns about nonunion. He tells me that in March he had to move and basically moved himself. He was on his foot a lot and then noticed some drainage from an open area. He has been following with his orthopedic surgeon Dr. Doran Durand. He has been applying a felt donut, dry dressing and using his heel healing sandal. 11/01/15; this is a patient I saw last week for the first time. He had a small open wound on the plantar aspect of his left foot at roughly the level of the base of his fifth metatarsal. He had a considerable degree of thickened skin around this wound on the plantar aspect which I thought was from chronic pressure on this area. He tells [place] that he had drainage over the course of the week. No systemic symptoms. 11/08/15; culture last week grew Citrobacter korseri. This should've been sensitive to the Augmentin I gave him. He has seen Dr. Doran Durand who did his initial surgery and according the patient the plan is to give this another month and then the hardware might need to come out of this. This seems like a reasonable plan. I will adjust his antibiotics to ciprofloxacin which probably should continue for at least another 2 weeks. I gave him 10 days worth today 11/22/15 the patient has completed antibiotics. He has an appointment with Dr. Doran Durand this Friday. There is  [name], [name] ([id number]) improved dimensions around the wound on the left fifth metatarsal base 11/29/15; the patient has completed antibiotics last week. Apparently his appointment with Dr. Doran Durand it is not until this Friday. Dimensions are roughly the same. 12/06/15; saw Dr. Doran Durand. No x-ray told the end of the month, next appointment June 30. We  have been using Aquacel Ag 12/13/15: No major change this week. Using Aquacel AG 621/17; arrived this week with maceration around the wound. There was quite a bit of undermining which required surgical debridement. I changed him to [facility] last week, by the patient's admission he was up on this more this week 12/27/15; macerated tissue around the wound is removed with a scalpel and pickups. There is no undermining. Nonviable subcutaneous tissue and skin taken from the superior circumference of the wound is slough from the surface. READMISSION 03/06/16 since I last saw this patient at the end of June, he went for surgery on 01/11/16 by Dr. Doran Durand of orthopedics. He had a left foot irrigation and debridement, removal of hardware and placement of wound VAC. He is also been followed by Dr. Megan Salon of infectious disease and completed a six-week course of IV Rocephin for group B strep and Enterobacter in the bone at the time of surgery. Apparently at the time of surgery the bone looked healthy so I don't think any bone was actually removed. He has been using silver alginate based dressings on the same wound area at the base of the left fifth metatarsal on the left. I note that he is also had arterial studies on 01/08/16, these showed a left ABI of 1.2 to and a right ABI of 1.3. Waveforms were listed as biphasic. He was not felt to have any specific arterial issues. 03/13/16; no real change in the condition of the wound at the left lateral foot at roughly the base of his fifth metatarsal. Use silver alginate last week. 03/18/16 arrives today with no open area. Being suspicious of the overlying callus I. Some of this back although I see nothing but covering tissue here/epithelium. There is no surrounding tenderness READMISSION 04/16/16 this is a patient I discharged about a month ago. Initially a surgical wound on the lateral aspect of his left foot which subsequently became infected. The story he is  giving today that he went back into his own modified shoe started to notice pain 2 weeks ago he was seen in Dr. Nona Dell office by a physician assistant last Wednesday and according the patient was told that everything looked fine however this is clearly now broken open and he has an open wound in the same spot that we have been dealing with repetitively. Situation is complicated by the fact that he is running short of money on long-term disability. He has not taken his insulin and at least 2 weeks was previously on NovoLog short acting insulin on a sliding scale and TRESIBA 35 units at bedtime. He is no longer able to afford any of his medication he was in the x-ray on 10/15. A plain x-ray showed healed fracture of the left fifth metatarsal bone status post removal of the associated plate and screw fixation hardware. There was no acute appearing osseous abnormality. His blood work showed a white count of 9.2 with an essentially normal differential comprehensive metabolic panel was normal. Previous CT scan of the foot on 01/08/16 showed no osteomyelitis previous vascular workup showed no evidence of significant PAD on 01/13/16 04/23/16; culture I did last week grew  enterococcus [ampicillin sensitive] and MRSA. He saw infectious disease yesterday. They stop the clindamycin and ordered an MRI. This is not unreasonable. All the hardware is out of the foot at this point. 04/30/16 at this point in time we are still awaiting the results of the MRI at this point in time. Patient did have an area which appears to be somewhat macerated in the proximal portion of the wound where there is overlying necrotic skin that is doing nothing more than trapping fluid underneath. He continues to state that he does have some discomfort and again the concern is for the possibility of osteomyelitis hence the reason [name], [name]. ([id number]) for the MRI order. We have been using a silver alginate dressing but again I  think the reason this with his macerated as it was is that the dressing obscene could not reach the entirety of the wound due to some necrotic skin covering the proximal portion. No bleeding noted at this point in time on initial evaluation. 05/07/16 we receive the results of patient's MRI which shows that he has no evidence of osteomyelitis. This obviously is excellent news. Patient was definitely happy to hear this. He tells me the wound appears to be doing very well at this point in time and is pleased with the progress currently. 05/15/16; as noted the patient did not have osteomyelitis. He has been released by infectious disease and orthopedics. His wound is still open he had a debridement last week but complain when he got home he "bleeding for half a day". He is not had any pain. We have been using silver alginate with Kerlix gauze wrap. 05/28/16; patient arrives today with the wound in much the same condition as last time. He has a small opening on the lateral aspect of his foot however with debridement there is clear undermining medially there is no real evidence of infection here and I didn't see any point in culturing this. One would have to wonder if this isn't a simple matter of in adequate offloading as he is been using a healing sandal. 06/05/16; the open area is now on the plantar aspect of his foot and not decide. The wound almost appears to have "migrated". This was the term use by our intake nurse. Objective Constitutional Patient is hypertensive.. Pulse regular and within target range for patient.Marland Kitchen Respirations regular, non-labored and within target range.. Temperature is normal and within the target range for the patient.. Patient's appearance is neat and clean. Appears in no acute distress. Well nourished and well developed.. Vitals Time Taken: 10:32 AM, Height: 70 in, Weight: 260 lbs, BMI: 37.3, Temperature: 98.0 F, Pulse: 95 bpm, Respiratory Rate: 18 breaths/min, Blood  Pressure: 150/95 mmHg. General Notes: Exam; the patient continues to have an open area now on the plantar aspect of his foot rather than the lateral edge of the foot. He has thick surrounding callus skin over I did not initiate any debridement of this the wound itself was debrided of callus skin and subcutaneous tissue to fully expose the circumference of the wound. This was done with a #3 curet. Hemostasis with direct pressure. He tolerated this well Integumentary (Hair, Skin) Wound #6 status is Open. Original cause of wound was Gradually Appeared. The wound is located on the Left,Lateral Foot. The wound measures 1cm length x 1cm width x 0.5cm depth; 0.785cm^2 area and 0.393cm^3 volume. The wound is limited to skin breakdown. There is no tunneling or undermining noted. There is a large amount of serosanguineous drainage  noted. The wound margin is thickened. There is large (67-100%) pink, pale granulation within the wound bed. There is a small (1-33%) amount of necrotic tissue within the wound bed including Adherent Slough. The periwound skin appearance exhibited: Callus, Moist. The periwound skin appearance did not exhibit: Crepitus, Excoriation, Fluctuance, Friable, Induration, Localized Edema, Rash, Scarring, Dry/Scaly, Maceration, Atrophie Blanche, Cyanosis, Ecchymosis, Hemosiderin Staining, Mottled, Pallor, Rubor, Erythema. Periwound temperature was noted as No [name], [name]. ([id number]) Abnormality. The periwound has tenderness on palpation. 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 There was a Skin/Subcutaneous Tissue Debridement [id number]) debridement with total area of 0.15 sq cm performed by Ricard Dillon, MD. with the following  instrument(s): Curette to remove Non-Viable tissue/material including Fibrin/Slough, Callus, and Subcutaneous after achieving pain control using Lidocaine 4% Topical Solution. A time out was conducted at 10:50, prior to the start of the procedure. A Minimum amount of bleeding was controlled with Pressure. The procedure was tolerated well with a pain level of 0 throughout and a pain level of 0 following the procedure. Post Debridement Measurements: 1cm length x 1cm width x 0.2cm depth; 0.157cm^3 volume. Character of Wound/Ulcer Post Debridement requires further debridement. Severity of Tissue Post Debridement is: Fat layer exposed. Post procedure Diagnosis Wound #6: Same as Pre-Procedure Wound #6 is a Diabetic Wound/Ulcer of the Lower Extremity located on the Left,Lateral Foot . There was a Total Contact Cast Procedure by Ricard Dillon, MD. Post procedure Diagnosis Wound #6: Same as Pre-Procedure Plan Wound Cleansing: Wound #6 Left,Lateral Foot: Cleanse wound with mild soap and water May Shower, gently pat wound dry prior to applying new dressing. PACO, FALKOWITZ ([id number]) May shower with protection. Anesthetic: Wound #6 Left,Lateral Foot: Topical Lidocaine 4% cream applied to wound bed prior to debridement Primary Wound Dressing: Wound #6 Left,Lateral Foot: Prisma Ag Secondary Dressing: Wound #6 Left,Lateral Foot: Gauze and Kerlix/Conform Dressing Change Frequency: Wound #6 Left,Lateral Foot: Change dressing every day. Follow-up Appointments: Wound #6 Left,Lateral Foot: Return Appointment in 1 week. Off-Loading: Wound #6 Left,Lateral Foot: Total Contact Cast to Left Lower Extremity Other: - felt Additional Orders / Instructions: Wound #6 Left,Lateral Foot: Increase protein intake. Activity as tolerated o #1 I continue to believe this is an offloading issue. #2 we dressed the wound with Prisma, felt, foam and put him in a total contact cast. #3 the area of his  previous fracture now protrudes somewhat. Will need to make sure that this offloads within the cast Electronic Signature(s) Signed: 06/06/2016 7:51:37 AM By: Gretta Cool RN, BSN, Kim RN, BSN Signed: 06/07/2016 2:36:16 AM By: [name] MD Previous Signature: 06/05/2016 4:39:43 PM Version By: [name] MD Entered By: Gretta Cool RN, BSN, Kim on 06/06/2016 07:51:37 [name], [name] ([id number], RECHER ([id number]) -------------------------------------------------------------------------------- Total Contact Cast Details Patient Name: [name], [name]. Date of Service: 06/05/2016 10:15 AM Medical Record Patient Account Number: [id number] Number: Treating RN: Baruch Gouty, RN, BSN, Rita 03-Dec-1960 (55 y.o. Other Clinician: Date of Birth/Sex: Male) Treating Sharrie Self Primary Care Physician/Extender: Ali Lowe, ADAKU Physician: Referring Physician: Judeth Horn in Treatment: 13 Total Contact Cast Applied for Wound Wound #6 Left,Lateral Foot Assessment: Performed By: Physician Ricard Dillon, MD Post Procedure Diagnosis Same as Pre-procedure Electronic Signature(s) Signed: 06/05/2016 4:17:16 PM By: Baruch Gouty  Velva Harman BSN, RN Signed: 06/05/2016 4:39:43 PM By: [name] MD Entered By: Regan Lemming on 06/05/2016 16:17:16 [name] ([id number]) -------------------------------------------------------------------------------- [place] Details Patient Name: EDWORD, PRIDGEON. Date of Service: 06/05/2016 Medical Record Patient Account Number: [id number] Number: Treating RN: Baruch Gouty, RN, BSN, Rita September 15, 1960 (55 y.o. 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, left ankle and foot Facility Procedures CPT4 Code Description: [id number] - DEB SUBQ TISSUE 20 SQ CM/< ICD-10 Description Diagnosis L97.523 Non-pressure chronic ulcer of other part of left foo E11.621 Type 2 diabetes mellitus with foot ulcer Modifier: t with necrosi Quantity: 1 s of muscle Physician Procedures CPT4 Code Description: [id number] - WC PHYS SUBQ TISS 20 SQ CM ICD-10 Description Diagnosis L97.523 Non-pressure chronic ulcer of other part of left foo E11.621 Type 2 diabetes mellitus with foot ulcer Modifier: t with necrosis Quantity: 1 of muscle Electronic Signature(s) Signed: 06/05/2016 4:39:43 PM By: [name] MD Entered By: [name] on 06/05/2016 12:53:42

## 2016-06-07 ENCOUNTER — Encounter: Payer: Managed Care, Other (non HMO) | Admitting: Surgery

## 2016-06-07 DIAGNOSIS — E11621 Type 2 diabetes mellitus with foot ulcer: Secondary | ICD-10-CM | POA: Diagnosis not present

## 2016-06-08 NOTE — Progress Notes (Signed)
FLORENZ, GORT ([id number]) Visit Report for 06/07/2016 Chief Complaint Document Details Patient Name: [name], [name]. Date of Service: 06/07/2016 1:45 PM Medical Record Number: [id number] Patient Account Number: [id number] Date of Birth/Sex: [birth year] (55 y.o. Male) Treating RN: Baruch Gouty, RN, BSN, Velva Harman Primary Care Physician: Gavin Pound Other Clinician: Referring Physician: Gavin Pound Treating Physician/Extender: Frann Rider in Treatment: 13 Information Obtained from: Patient Chief Complaint 03/06/16; patient returns today after further surgery on the left foot as well as a 6 week course of IV antibiotics Electronic Signature(s) Signed: 06/07/2016 3:23:50 PM By: Christin Fudge MD, FACS Entered By: Christin Fudge on 06/07/2016 15:23:50 [name] ([id number]) -------------------------------------------------------------------------------- Debridement Details Patient Name: [name]. Date of Service: 06/07/2016 1:45 PM Medical Record Number: [id number] Patient Account Number: [id number] Date of Birth/Sex: June 23, 1961 (55 y.o. Male) Treating RN: Baruch Gouty, RN, BSN, [place] Primary Care Physician: Gavin Pound Other Clinician: Referring Physician: Gavin Pound Treating Physician/Extender: Frann Rider in Treatment: 13 Debridement Performed for Wound #6 Left,Lateral Foot Assessment: Performed By: Physician Christin Fudge, MD Debridement: Debridement Pre-procedure Yes - 03:00 Verification/Time Out Taken: Start Time: 03:00 Pain Control: Lidocaine 5% topical ointment Level: Skin/Subcutaneous Tissue Total Area Debrided (L x 0.5 (cm) x 1 (cm) = 0.5 (cm) W): Tissue and other Viable, Non-Viable, Callus, Fibrin/Slough, Subcutaneous material debrided: Instrument: Forceps, Scissors Bleeding: Minimum Hemostasis Achieved: Pressure End Time: 03:05 Procedural Pain: 0 Post Procedural Pain: 0 Response to Treatment: Procedure was tolerated well Post Debridement  Measurements of Total Wound Length: (cm) 0.5 Width: (cm) 1 Depth: (cm) 0.5 Volume: (cm) 0.196 Character of Wound/Ulcer Post Requires Further Debridement Debridement: Severity of Tissue Post Debridement: Fat layer exposed Post Procedure Diagnosis Same as Pre-procedure Electronic Signature(s) Signed: 06/07/2016 3:43:16 PM By: Christin Fudge MD, FACS Signed: 06/07/2016 3:48:43 PM By: Regan Lemming BSN, RN Entered By: Christin Fudge on 06/07/2016 15:43:16 [name] ([name] ([id number]) -------------------------------------------------------------------------------- HPI Details Patient Name: [name]. Date of Service: 06/07/2016 1:45 PM Medical Record Number: [id number] Patient Account Number: [id number] Date of Birth/Sex: July 06, 1960 (55 y.o. Male) Treating RN: Baruch Gouty, RN, BSN, Velva Harman Primary Care Physician: Gavin Pound Other Clinician: Referring Physician: Gavin Pound Treating Physician/Extender: Frann Rider in Treatment: 13 History of Present Illness HPI Description: Pleasant 55 year old with history of diabetes (Hgb A1c 10.8 in 2014) and peripheral neuropathy. No PVD. L ABI 1.1. Status post right great toe partial amputation years ago. He was at work and on 10/22/2014, was injured by a cart, and suffered an ulceration to his left anterior calf. He says that it subsequently became infected, and he was treated with a course of antibiotics. He was found on initial exam to have an ulceration on the dorsum of his left third toe. He was unaware of this and attributes it to pressure from his steel toed boots. More recently he injured his right anterior calf on a cart. Ambulating normally per his baseline. He has been undergoing regular debridements, applying mupirocin cream, and an Ace wrap for edema control. He returns to clinic for follow-up and is without complaints. No pain. No fever or chills. No drainage. 10/25/15; this is a 55 year old man who  has type II diabetes with diabetic polyneuropathy. He tells me that he fractured his left fifth metatarsal in June 2016 when he presented with swelling. He does not recall a specific injury. His hemoglobin A1c was apparently too high at the time for consideration of surgery and he was put in some form of offloading. Ultimately he went  to surgery in December with an allograft from his calcaneus to this site, plate and screws. He had an x-ray of the foot in March that showed concerns about nonunion. He tells me that in March he had to move and basically moved himself. He was on his foot a lot and then noticed some drainage from an open area. He has been following with his orthopedic surgeon Dr. Doran Durand. He has been applying a felt donut, dry dressing and using his heel healing sandal. 11/01/15; this is a patient I saw last week for the first time. He had a small open wound on the plantar aspect of his left foot at roughly the level of the base of his fifth metatarsal. He had a considerable degree of thickened skin around this wound on the plantar aspect which I thought was from chronic pressure on this area. He tells [place] that he had drainage over the course of the week. No systemic symptoms. 11/08/15; culture last week grew Citrobacter korseri. This should've been sensitive to the Augmentin I gave him. He has seen Dr. Doran Durand who did his initial surgery and according the patient the plan is to give this another month and then the hardware might need to come out of this. This seems like a reasonable plan. I will adjust his antibiotics to ciprofloxacin which probably should continue for at least another 2 weeks. I gave him 10 days worth today 11/22/15 the patient has completed antibiotics. He has an appointment with Dr. Doran Durand this Friday. There is improved dimensions around the wound on the left fifth metatarsal base 11/29/15; the patient has completed antibiotics last week. Apparently his appointment with  Dr. Doran Durand it is not until this Friday. Dimensions are roughly the same. 12/06/15; saw Dr. Doran Durand. No x-ray told the end of the month, next appointment June 30. We have been using Aquacel Ag 12/13/15: No major change this week. Using Aquacel AG 621/17; arrived this week with maceration around the wound. There was quite a bit of undermining which required surgical debridement. I changed him to [facility] last week, by the patient's admission he was up on this more this week RAOUL, SOMERO. ([id number]) 12/27/15; macerated tissue around the wound is removed with a scalpel and pickups. There is no undermining. Nonviable subcutaneous tissue and skin taken from the superior circumference of the wound is slough from the surface. READMISSION 03/06/16 since I last saw this patient at the end of June, he went for surgery on 01/11/16 by Dr. Doran Durand of orthopedics. He had a left foot irrigation and debridement, removal of hardware and placement of wound VAC. He is also been followed by Dr. Megan Salon of infectious disease and completed a six-week course of IV Rocephin for group B strep and Enterobacter in the bone at the time of surgery. Apparently at the time of surgery the bone looked healthy so I don't think any bone was actually removed. He has been using silver alginate based dressings on the same wound area at the base of the left fifth metatarsal on the left. I note that he is also had arterial studies on 01/08/16, these showed a left ABI of 1.2 to and a right ABI of 1.3. Waveforms were listed as biphasic. He was not felt to have any specific arterial issues. 03/13/16; no real change in the condition of the wound at the left lateral foot at roughly the base of his fifth metatarsal. Use silver alginate last week. 03/18/16 arrives today with no open area. Being  suspicious of the overlying callus I. Some of this back although I see nothing but covering tissue here/epithelium. There is no surrounding  tenderness READMISSION 04/16/16 this is a patient I discharged about a month ago. Initially a surgical wound on the lateral aspect of his left foot which subsequently became infected. The story he is giving today that he went back into his own modified shoe started to notice pain 2 weeks ago he was seen in Dr. Nona Dell office by a physician assistant last Wednesday and according the patient was told that everything looked fine however this is clearly now broken open and he has an open wound in the same spot that we have been dealing with repetitively. Situation is complicated by the fact that he is running short of money on long-term disability. He has not taken his insulin and at least 2 weeks was previously on NovoLog short acting insulin on a sliding scale and TRESIBA 35 units at bedtime. He is no longer able to afford any of his medication he was in the x-ray on 10/15. A plain x-ray showed healed fracture of the left fifth metatarsal bone status post removal of the associated plate and screw fixation hardware. There was no acute appearing osseous abnormality. His blood work showed a white count of 9.2 with an essentially normal differential comprehensive metabolic panel was normal. Previous CT scan of the foot on 01/08/16 showed no osteomyelitis previous vascular workup showed no evidence of significant PAD on 01/13/16 04/23/16; culture I did last week grew enterococcus [ampicillin sensitive] and MRSA. He saw infectious disease yesterday. They stop the clindamycin and ordered an MRI. This is not unreasonable. All the hardware is out of the foot at this point. 04/30/16 at this point in time we are still awaiting the results of the MRI at this point in time. Patient did have an area which appears to be somewhat macerated in the proximal portion of the wound where there is overlying necrotic skin that is doing nothing more than trapping fluid underneath. He continues to state that he does have  some discomfort and again the concern is for the possibility of osteomyelitis hence the reason for the MRI order. We have been using a silver alginate dressing but again I think the reason this with his macerated as it was is that the dressing obscene could not reach the entirety of the wound due to some necrotic skin covering the proximal portion. No bleeding noted at this point in time on initial evaluation. 05/07/16 we receive the results of patient's MRI which shows that he has no evidence of osteomyelitis. This obviously is excellent news. Patient was definitely happy to hear this. He tells me the wound appears to be doing very well at this point in time and is pleased with the progress currently. 05/15/16; as noted the patient did not have osteomyelitis. He has been released by infectious disease and orthopedics. His wound is still open he had a debridement last week but complain when he got home he "bleeding for half a day". He is not had any pain. We have been using silver alginate with Kerlix gauze wrap. THEOPLIS, HAUGER ([id number]) 05/28/16; patient arrives today with the wound in much the same condition as last time. He has a small opening on the lateral aspect of his foot however with debridement there is clear undermining medially there is no real evidence of infection here and I didn't see any point in culturing this. One would have to wonder if  this isn't a simple matter of in adequate offloading as he is been using a healing sandal. 06/05/16; the open area is now on the plantar aspect of his foot and not decide. The wound almost appears to have "migrated". This was the term use by our intake nurse. Electronic Signature(s) Signed: 06/07/2016 3:24:05 PM By: Christin Fudge MD, FACS Entered By: Christin Fudge on 06/07/2016 15:24:05 [name] ([id number]) -------------------------------------------------------------------------------- Physical Exam Details Patient Name: CHRISHON, FRERKING. Date of Service: 06/07/2016 1:45 PM Medical Record Number: [id number] Patient Account Number: [id number] Date of Birth/Sex: 1960-12-06 (55 y.o. Male) Treating RN: Baruch Gouty, RN, BSN, Velva Harman Primary Care Physician: Gavin Pound Other Clinician: Referring Physician: Gavin Pound Treating Physician/Extender: Frann Rider in Treatment: 13 Constitutional . Pulse regular. Respirations normal and unlabored. Afebrile. . Eyes Nonicteric. Reactive to light. Ears, Nose, Mouth, and Throat Lips, teeth, and gums WNL.Marland Kitchen Moist mucosa without lesions. Neck supple and nontender. No palpable supraclavicular or cervical adenopathy. Normal sized without goiter. Respiratory WNL. No retractions.. Breath sounds WNL, No rubs, rales, rhonchi, or wheeze.. Cardiovascular Heart rhythm and rate regular, no murmur or gallop.. Pedal Pulses WNL. No clubbing, cyanosis or edema. Lymphatic No adneopathy. No adenopathy. No adenopathy. Musculoskeletal Adexa without tenderness or enlargement.. Digits and nails w/o clubbing, cyanosis, infection, petechiae, ischemia, or inflammatory conditions.. Integumentary (Hair, Skin) No suspicious lesions. No crepitus or fluctuance. No peri-wound warmth or erythema. No masses.Marland Kitchen Psychiatric Judgement and insight Intact.. No evidence of depression, anxiety, or agitation.. Notes the patient did come for a cast change in 48 hours and was a lot of maceration and loose overhanging callus which I sharply removed with forceps and scissors and freshen the entire edges of the wound. The base of the wound was healthy and he was ready for another application of the TCC. Electronic Signature(s) Signed: 06/07/2016 3:25:05 PM By: Christin Fudge MD, FACS Entered By: Christin Fudge on 06/07/2016 15:25:03 [name] ([id number]) -------------------------------------------------------------------------------- Physician Orders Details Patient Name: RISHABH, MCMURDIE. Date of Service:  06/07/2016 1:45 PM Medical Record Number: [id number] Patient Account Number: [id number] Date of Birth/Sex: 1960/10/23 (55 y.o. Male) Treating RN: Baruch Gouty, RN, BSN, Velva Harman Primary Care Physician: Gavin Pound Other Clinician: Referring Physician: Gavin Pound Treating Physician/Extender: Frann Rider in Treatment: 63 Verbal / Phone Orders: Yes Clinician: Afful, RN, BSN, Rita Read Back and Verified: Yes Diagnosis Coding Wound Cleansing Wound #6 Left,Lateral Foot o Cleanse wound with mild soap and water o May Shower, gently pat wound dry prior to applying new dressing. o May shower with protection. Anesthetic Wound #6 Left,Lateral Foot o Topical Lidocaine 4% cream applied to wound bed prior to debridement Primary Wound Dressing Wound #6 Left,Lateral Foot o Aquacel Ag Secondary Dressing Wound #6 Left,Lateral Foot o Gauze and Kerlix/Conform Dressing Change Frequency Wound #6 Left,Lateral Foot o Change dressing every day. Follow-up Appointments Wound #6 Left,Lateral Foot o Return Appointment in 1 week. Off-Loading Wound #6 Left,Lateral Foot o Total Contact Cast to Left Lower Extremity o Other: - felt Additional Orders / Instructions Wound #6 Left,Lateral Foot o Increase protein intake. o Activity as tolerated JUANMANUEL, HOLUBEC ([id number]) Electronic Signature(s) Signed: 06/07/2016 3:09:38 PM By: Regan Lemming BSN, RN Signed: 06/07/2016 3:45:07 PM By: Christin Fudge MD, FACS Entered By: Regan Lemming on 06/07/2016 15:09:37 [name] ([id number]) -------------------------------------------------------------------------------- Problem List Details Patient Name: KENNA, FOGELMAN. Date of Service: 06/07/2016 1:45 PM Medical Record Number: [id number] Patient Account Number: [id number] Date of Birth/Sex: 11-03-1960 (55 y.o. Male) Treating RN: Baruch Gouty, RN, BSN,  Velva Harman Primary Care Physician: Gavin Pound Other Clinician: Referring Physician: Gavin Pound Treating Physician/Extender: Frann Rider in Treatment: 13 Active Problems ICD-10 Encounter Code Description Active Date Diagnosis E11.621 Type 2 diabetes mellitus with foot ulcer 03/06/2016 Yes L97.523 Non-pressure chronic ulcer of other part of left foot with 03/06/2016 Yes necrosis of muscle Inactive Problems ICD-10 Code Description Active Date Inactive Date M86.672 Other chronic osteomyelitis, left ankle and foot 03/06/2016 03/06/2016 Resolved Problems Electronic Signature(s) Signed: 06/07/2016 3:19:56 PM By: Christin Fudge MD, FACS Entered By: Christin Fudge on 06/07/2016 15:19:56 [name] ([id number]) -------------------------------------------------------------------------------- Progress Note Details Patient Name: [name]. Date of Service: 06/07/2016 1:45 PM Medical Record Number: [id number] Patient Account Number: [id number] Date of Birth/Sex: 1961/01/21 (55 y.o. Male) Treating RN: Baruch Gouty, RN, BSN, Velva Harman Primary Care Physician: Gavin Pound Other Clinician: Referring Physician: Gavin Pound Treating Physician/Extender: Frann Rider in Treatment: 13 Subjective Chief Complaint Information obtained from Patient 03/06/16; patient returns today after further surgery on the left foot as well as a 6 week course of IV antibiotics History of Present Illness (HPI) Pleasant 55 year old with history of diabetes (Hgb A1c 10.8 in 2014) and peripheral neuropathy. No PVD. L ABI 1.1. Status post right great toe partial amputation years ago. He was at work and on 10/22/2014, was injured by a cart, and suffered an ulceration to his left anterior calf. He says that it subsequently became infected, and he was treated with a course of antibiotics. He was found on initial exam to have an ulceration on the dorsum of his left third toe. He was unaware of this and attributes it to pressure from his steel toed boots. More recently he injured his right anterior calf on a  cart. Ambulating normally per his baseline. He has been undergoing regular debridements, applying mupirocin cream, and an Ace wrap for edema control. He returns to clinic for follow-up and is without complaints. No pain. No fever or chills. No drainage. 10/25/15; this is a 55 year old man who has type II diabetes with diabetic polyneuropathy. He tells me that he fractured his left fifth metatarsal in June 2016 when he presented with swelling. He does not recall a specific injury. His hemoglobin A1c was apparently too high at the time for consideration of surgery and he was put in some form of offloading. Ultimately he went to surgery in December with an allograft from his calcaneus to this site, plate and screws. He had an x-ray of the foot in March that showed concerns about nonunion. He tells me that in March he had to move and basically moved himself. He was on his foot a lot and then noticed some drainage from an open area. He has been following with his orthopedic surgeon Dr. Doran Durand. He has been applying a felt donut, dry dressing and using his heel healing sandal. 11/01/15; this is a patient I saw last week for the first time. He had a small open wound on the plantar aspect of his left foot at roughly the level of the base of his fifth metatarsal. He had a considerable degree of thickened skin around this wound on the plantar aspect which I thought was from chronic pressure on this area. He tells [place] that he had drainage over the course of the week. No systemic symptoms. 11/08/15; culture last week grew Citrobacter korseri. This should've been sensitive to the Augmentin I gave him. He has seen Dr. Doran Durand who did his initial surgery and according the patient the plan is to  give this another month and then the hardware might need to come out of this. This seems like a reasonable plan. I will adjust his antibiotics to ciprofloxacin which probably should continue for at least another 2 weeks. I  gave him 10 days worth today 11/22/15 the patient has completed antibiotics. He has an appointment with Dr. Doran Durand this Friday. There is improved dimensions around the wound on the left fifth metatarsal base 11/29/15; the patient has completed antibiotics last week. Apparently his appointment with Dr. Doran Durand it is not until this Friday. Dimensions are roughly the same. RIPKEN, MONTERROSA ([id number]) 12/06/15; saw Dr. Doran Durand. No x-ray told the end of the month, next appointment June 30. We have been using Aquacel Ag 12/13/15: No major change this week. Using Aquacel AG 621/17; arrived this week with maceration around the wound. There was quite a bit of undermining which required surgical debridement. I changed him to [facility] last week, by the patient's admission he was up on this more this week 12/27/15; macerated tissue around the wound is removed with a scalpel and pickups. There is no undermining. Nonviable subcutaneous tissue and skin taken from the superior circumference of the wound is slough from the surface. READMISSION 03/06/16 since I last saw this patient at the end of June, he went for surgery on 01/11/16 by Dr. Doran Durand of orthopedics. He had a left foot irrigation and debridement, removal of hardware and placement of wound VAC. He is also been followed by Dr. Megan Salon of infectious disease and completed a six-week course of IV Rocephin for group B strep and Enterobacter in the bone at the time of surgery. Apparently at the time of surgery the bone looked healthy so I don't think any bone was actually removed. He has been using silver alginate based dressings on the same wound area at the base of the left fifth metatarsal on the left. I note that he is also had arterial studies on 01/08/16, these showed a left ABI of 1.2 to and a right ABI of 1.3. Waveforms were listed as biphasic. He was not felt to have any specific arterial issues. 03/13/16; no real change in the condition of the  wound at the left lateral foot at roughly the base of his fifth metatarsal. Use silver alginate last week. 03/18/16 arrives today with no open area. Being suspicious of the overlying callus I. Some of this back although I see nothing but covering tissue here/epithelium. There is no surrounding tenderness READMISSION 04/16/16 this is a patient I discharged about a month ago. Initially a surgical wound on the lateral aspect of his left foot which subsequently became infected. The story he is giving today that he went back into his own modified shoe started to notice pain 2 weeks ago he was seen in Dr. Nona Dell office by a physician assistant last Wednesday and according the patient was told that everything looked fine however this is clearly now broken open and he has an open wound in the same spot that we have been dealing with repetitively. Situation is complicated by the fact that he is running short of money on long-term disability. He has not taken his insulin and at least 2 weeks was previously on NovoLog short acting insulin on a sliding scale and TRESIBA 35 units at bedtime. He is no longer able to afford any of his medication he was in the x-ray on 10/15. A plain x-ray showed healed fracture of the left fifth metatarsal bone status post removal of  the associated plate and screw fixation hardware. There was no acute appearing osseous abnormality. His blood work showed a white count of 9.2 with an essentially normal differential comprehensive metabolic panel was normal. Previous CT scan of the foot on 01/08/16 showed no osteomyelitis previous vascular workup showed no evidence of significant PAD on 01/13/16 04/23/16; culture I did last week grew enterococcus [ampicillin sensitive] and MRSA. He saw infectious disease yesterday. They stop the clindamycin and ordered an MRI. This is not unreasonable. All the hardware is out of the foot at this point. 04/30/16 at this point in time we are still  awaiting the results of the MRI at this point in time. Patient did have an area which appears to be somewhat macerated in the proximal portion of the wound where there is overlying necrotic skin that is doing nothing more than trapping fluid underneath. He continues to state that he does have some discomfort and again the concern is for the possibility of osteomyelitis hence the reason for the MRI order. We have been using a silver alginate dressing but again I think the reason this with his macerated as it was is that the dressing obscene could not reach the entirety of the wound due to some necrotic skin covering the proximal portion. No bleeding noted at this point in time on initial evaluation. JONES, STRIMPLE ([id number]) 05/07/16 we receive the results of patient's MRI which shows that he has no evidence of osteomyelitis. This obviously is excellent news. Patient was definitely happy to hear this. He tells me the wound appears to be doing very well at this point in time and is pleased with the progress currently. 05/15/16; as noted the patient did not have osteomyelitis. He has been released by infectious disease and orthopedics. His wound is still open he had a debridement last week but complain when he got home he "bleeding for half a day". He is not had any pain. We have been using silver alginate with Kerlix gauze wrap. 05/28/16; patient arrives today with the wound in much the same condition as last time. He has a small opening on the lateral aspect of his foot however with debridement there is clear undermining medially there is no real evidence of infection here and I didn't see any point in culturing this. One would have to wonder if this isn't a simple matter of in adequate offloading as he is been using a healing sandal. 06/05/16; the open area is now on the plantar aspect of his foot and not decide. The wound almost appears to have "migrated". This was the term use by our intake  nurse. Objective Constitutional Pulse regular. Respirations normal and unlabored. Afebrile. Vitals Time Taken: 2:01 PM, Height: 70 in, Weight: 260 lbs, BMI: 37.3, Temperature: 98.4 F, Pulse: 99 bpm, Respiratory Rate: 18 breaths/min, Blood Pressure: 154/85 mmHg. Eyes Nonicteric. Reactive to light. Ears, Nose, Mouth, and Throat Lips, teeth, and gums WNL.Marland Kitchen Moist mucosa without lesions. Neck supple and nontender. No palpable supraclavicular or cervical adenopathy. Normal sized without goiter. Respiratory WNL. No retractions.. Breath sounds WNL, No rubs, rales, rhonchi, or wheeze.. Cardiovascular Heart rhythm and rate regular, no murmur or gallop.. Pedal Pulses WNL. No clubbing, cyanosis or edema. Lymphatic No adneopathy. No adenopathy. No adenopathy. Musculoskeletal Adexa without tenderness or enlargement.. Digits and nails w/o clubbing, cyanosis, infection, petechiae, ischemia, or inflammatory conditions.Marland Kitchen Psychiatric TAYDON, KLASS ([id number]) Judgement and insight Intact.. No evidence of depression, anxiety, or agitation.. General Notes: the patient did come for a  cast change in 48 hours and was a lot of maceration and loose overhanging callus which I sharply removed with forceps and scissors and freshen the entire edges of the wound. The base of the wound was healthy and he was ready for another application of the TCC. Integumentary (Hair, Skin) No suspicious lesions. No crepitus or fluctuance. No peri-wound warmth or erythema. No masses.. Wound #6 status is Open. Original cause of wound was Gradually Appeared. The wound is located on the Left,Lateral Foot. The wound measures 0.5cm length x 1cm width x 0.5cm depth; 0.393cm^2 area and 0.196cm^3 volume. The wound is limited to skin breakdown. There is no tunneling or undermining noted. There is a large amount of serosanguineous drainage noted. The wound margin is thickened. There is large (67-100%) pink, pale granulation within the  wound bed. There is a small (1-33%) amount of necrotic tissue within the wound bed including Adherent Slough. The periwound skin appearance exhibited: Callus, Moist. The periwound skin appearance did not exhibit: Crepitus, Excoriation, Fluctuance, Friable, Induration, Localized Edema, Rash, Scarring, Dry/Scaly, Maceration, Atrophie Blanche, Cyanosis, Ecchymosis, Hemosiderin Staining, Mottled, Pallor, Rubor, Erythema. Periwound temperature was noted as No Abnormality. The periwound has tenderness on palpation. 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 There was a Skin/Subcutaneous Tissue Debridement [id number]) debridement with total area of 0.5 sq cm performed by Christin Fudge, MD. with the following instrument(s): Forceps and Scissors to remove Viable and Non- Viable tissue/material including Fibrin/Slough, Callus, and Subcutaneous after achieving pain control using Lidocaine 5% topical ointment. A time out was conducted at 03:00, prior to the start of the procedure. A Minimum amount of bleeding was controlled with Pressure. The procedure was tolerated well with a pain level of 0 throughout and a pain level of 0 following the procedure. Post Debridement Measurements: 0.5cm length x 1cm width x 0.5cm depth; 0.196cm^3 volume. Character of Wound/Ulcer Post Debridement requires further debridement. Severity of Tissue Post Debridement is: Fat layer exposed. ADALID, HORRALL ([id number]) Post procedure Diagnosis Wound #6: Same as Pre-Procedure Plan Wound Cleansing: Wound #6 Left,Lateral Foot: Cleanse wound with mild soap and water May Shower, gently pat wound dry prior to applying new dressing. May shower with protection. Anesthetic: Wound #6  Left,Lateral Foot: Topical Lidocaine 4% cream applied to wound bed prior to debridement Primary Wound Dressing: Wound #6 Left,Lateral Foot: Aquacel Ag Secondary Dressing: Wound #6 Left,Lateral Foot: Gauze and Kerlix/Conform Dressing Change Frequency: Wound #6 Left,Lateral Foot: Change dressing every day. Follow-up Appointments: Wound #6 Left,Lateral Foot: Return Appointment in 1 week. Off-Loading: Wound #6 Left,Lateral Foot: Total Contact Cast to Left Lower Extremity Other: - felt Additional Orders / Instructions: Wound #6 Left,Lateral Foot: Increase protein intake. Activity as tolerated After review I have recommended Aquacel Ag with some surrounding felt and the total contact cast to be applied to the sees Dr. Dellia Nims the coming Wednesday Electronic Signature(s) Signed: 06/07/2016 3:43:31 PM By: Christin Fudge MD, FACS [name] ([id number]) Previous Signature: 06/07/2016 3:38:04 PM Version By: Christin Fudge MD, FACS Previous Signature: 06/07/2016 3:37:57 PM Version By: Christin Fudge MD, FACS Previous Signature: 06/07/2016 3:26:01 PM Version By: Christin Fudge MD, FACS Entered By: Christin Fudge on 06/07/2016 15:43:31 [name] ([id number]) -------------------------------------------------------------------------------- SuperBill Details Patient Name: HOSTEEN, FUDALA. Date of Service: 06/07/2016 Medical  Record Number: [id number] Patient Account Number: [id number] Date of Birth/Sex: [birth year] (56 y.o. Male) Treating RN: Baruch Gouty, RN, BSN, Velva Harman Primary Care Physician: Gavin Pound Other Clinician: Referring Physician: Gavin Pound Treating Physician/Extender: Frann Rider in Treatment: 13 Diagnosis Coding ICD-10 Codes Code Description E11.621 Type 2 diabetes mellitus with foot ulcer L97.523 Non-pressure chronic ulcer of other part of left foot with necrosis of muscle Facility Procedures CPT4 Code Description: [id number] - WOUND CARE VISIT-LEV 2 EST  PT Modifier: Quantity: 1 CPT4 Code Description: [id number] - APPLY TOTAL CONTACT LEG CAST ICD-10 Description Diagnosis E11.621 Type 2 diabetes mellitus with foot ulcer L97.523 Non-pressure chronic ulcer of other part of left foot Modifier: with necrosi Quantity: 1 s of muscle Physician Procedures CPT4 Code Description: [id number] - WC PHYS APPLY TOTAL CONTACT CAST ICD-10 Description Diagnosis E11.621 Type 2 diabetes mellitus with foot ulcer L97.523 Non-pressure chronic ulcer of other part of left foot Modifier: with necrosis Quantity: 1 of muscle Electronic Signature(s) Signed: 06/07/2016 3:44:42 PM By: Christin Fudge MD, FACS Previous Signature: 06/07/2016 3:38:33 PM Version By: Christin Fudge MD, FACS Previous Signature: 06/07/2016 3:38:26 PM Version By: Christin Fudge MD, FACS Previous Signature: 06/07/2016 3:27:55 PM Version By: Christin Fudge MD, FACS Entered By: Christin Fudge on 06/07/2016 15:44:42

## 2016-06-08 NOTE — Progress Notes (Signed)
JHORDY, FAZZINO ([id number]) Visit Report for 06/07/2016 Arrival Information Details Patient Name: [name], [name]. Date of Service: 06/07/2016 1:45 PM Medical Record Number: [id number] Patient Account Number: [id number] Date of Birth/Sex: 1960-09-07 (55 y.o. Male) Treating RN: Baruch Gouty, RN, BSN, Velva Harman Primary Care Physician: Gavin Pound Other Clinician: Referring Physician: Gavin Pound Treating Physician/Extender: Frann Rider in Treatment: 13 Visit Information History Since Last Visit All ordered tests and consults were No Patient Arrived: Ambulatory completed: Arrival Time: 13:59 Added or deleted any medications: No Accompanied By: self Any new allergies or adverse reactions: No Transfer Assistance: None Had a fall or experienced change in No Patient Identification Verified: Yes activities of daily living that may affect Secondary Verification Process Yes risk of falls: Completed: Signs or symptoms of abuse/neglect since No Patient Requires Transmission-Based No last visito Precautions: Hospitalized since last visit: No Patient Has Alerts: Yes Has Dressing in Place as Prescribed: Yes Patient Alerts: DM II Has Footwear/Offloading in Place as Yes Prescribed: Left: Total Contact Cast Pain Present Now: No Electronic Signature(s) Signed: 06/07/2016 3:48:43 PM By: Regan Lemming BSN, RN Entered By: Regan Lemming on 06/07/2016 14:01:05 [name] ([id number]) -------------------------------------------------------------------------------- Clinic Level of Care Assessment Details Patient Name: [name]. Date of Service: 06/07/2016 1:45 PM Medical Record Number: [id number] Patient Account Number: [id number] Date of Birth/Sex: Mar 12, 1961 (55 y.o. Male) Treating RN: Baruch Gouty, RN, BSN, [place] Primary Care Physician: Gavin Pound Other Clinician: Referring Physician: Gavin Pound Treating Physician/Extender: Frann Rider in Treatment: 13 Clinic Level of Care  Assessment Items TOOL 3 Quantity Score []  - Use when EandM and Procedure is performed on FOLLOW-UP visit 0 ASSESSMENTS - Nursing Assessment / Reassessment X - Reassessment of Co-morbidities (includes updates in patient status) 1 10 X - Reassessment of Adherence to Treatment Plan 1 5 ASSESSMENTS - Wound and Skin Assessment / Reassessment []  - Points for Wound Assessment can only be taken for a new wound of unknown 0 or different etiology and a procedure is NOT performed to that wound X - Simple Wound Assessment / Reassessment - one wound 1 5 []  - Complex Wound Assessment / Reassessment - multiple wounds 0 []  - Dermatologic / Skin Assessment (not related to wound area) 0 ASSESSMENTS - Focused Assessment []  - Circumferential Edema Measurements - multi extremities 0 []  - Nutritional Assessment / Counseling / Intervention 0 X - Lower Extremity Assessment (monofilament, tuning fork, pulses) 1 5 []  - Peripheral Arterial Disease Assessment (using hand held doppler) 0 ASSESSMENTS - Ostomy and/or Continence Assessment and Care []  - Incontinence Assessment and Management 0 []  - Ostomy Care Assessment and Management (repouching, etc.) 0 PROCESS - Coordination of Care []  - Points for Discharge Coordination can only be taken for a new wound of 0 unknown or different etiology and a procedure is NOT performed to that wound X - Simple Patient / Family Education for ongoing care 1 15 []  - Complex (extensive) Patient / Family Education for ongoing care 0 TAISHI, SLUSSER ([id number]) []  - Staff obtains Consents, Records, Test Results / Process Orders 0 []  - Staff telephones HHA, Nursing Homes / Clarify orders / etc 0 []  - Routine Transfer to another Facility (non-emergent condition) 0 []  - Routine Hospital Admission (non-emergent condition) 0 []  - New Admissions / Biomedical engineer / Ordering NPWT, Apligraf, etc. 0 []  - Emergency Hospital Admission (emergent condition) 0 []  - Simple Discharge  Coordination 0 []  - Complex (extensive) Discharge Coordination 0 PROCESS - Special Needs []  - Pediatric / Minor Patient  Management 0 []  - Isolation Patient Management 0 []  - Hearing / Language / Visual special needs 0 []  - Assessment of Community assistance (transportation, D/C planning, etc.) 0 []  - Additional assistance / Altered mentation 0 []  - Support Surface(s) Assessment (bed, cushion, seat, etc.) 0 INTERVENTIONS - Wound Cleansing / Measurement []  - Points for Wound Cleaning / Measurement, Wound Dressing, Specimen 0 Collection and Specimen taken to lab can only be taken for a new wound of unknown or different etiology and a procedure is NOT performed to that wound X - Simple Wound Cleansing - one wound 1 5 []  - Complex Wound Cleansing - multiple wounds 0 X - Wound Imaging (photographs - any number of wounds) 1 5 []  - Wound Tracing (instead of photographs) 0 X - Simple Wound Measurement - one wound 1 5 []  - Complex Wound Measurement - multiple wounds 0 INTERVENTIONS - Wound Dressings X - Small Wound Dressing one or multiple wounds 1 10 Leiva, Ulyess F. ([id number]) []  - Medium Wound Dressing one or multiple wounds 0 []  - Large Wound Dressing one or multiple wounds 0 INTERVENTIONS - Miscellaneous []  - External ear exam 0 []  - Specimen Collection (cultures, biopsies, blood, body fluids, etc.) 0 []  - Specimen(s) / Culture(s) sent or taken to Lab for analysis 0 []  - Patient Transfer (multiple staff / Harrel Lemon Lift / Similar devices) 0 []  - Simple Staple / Suture removal (25 or less) 0 []  - Complex Staple / Suture removal (26 or more) 0 []  - Hypo / Hyperglycemic Management (close monitor of Blood Glucose) 0 []  - Ankle / Brachial Index (ABI) - do not check if billed separately 0 X - Vital Signs 1 5 Has the patient been seen at the hospital within the last three years: Yes Total Score: 70 Level Of Care: New/Established - Level 2 Electronic Signature(s) Signed: 06/07/2016 3:48:43 PM  By: Regan Lemming BSN, RN Entered By: Regan Lemming on 06/07/2016 15:11:05 [name] ([id number]) -------------------------------------------------------------------------------- Encounter Discharge Information Details Patient Name: [name]. Date of Service: 06/07/2016 1:45 PM Medical Record Number: [id number] Patient Account Number: [id number] Date of Birth/Sex: 04-18-1961 (55 y.o. Male) Treating RN: Baruch Gouty, RN, BSN, Velva Harman Primary Care Physician: Gavin Pound Other Clinician: Referring Physician: Gavin Pound Treating Physician/Extender: Frann Rider in Treatment: 13 Encounter Discharge Information Items Discharge Pain Level: 0 Discharge Condition: Stable Ambulatory Status: Ambulatory Discharge Destination: Home Transportation: Private Auto Accompanied By: self Schedule Follow-up Appointment: No Medication Reconciliation completed and provided to Patient/Care No Lethia Donlon: Provided on Clinical Summary of Care: 06/07/2016 Form Type Recipient Paper Patient HP Electronic Signature(s) Signed: 06/07/2016 3:48:19 PM By: Regan Lemming BSN, RN Previous Signature: 06/07/2016 3:25:37 PM Version By: Ruthine Dose Entered By: Regan Lemming on 06/07/2016 15:48:19 [name] ([id number]) -------------------------------------------------------------------------------- Lower Extremity Assessment Details Patient Name: [name]. Date of Service: 06/07/2016 1:45 PM Medical Record Number: [id number] Patient Account Number: [id number] Date of Birth/Sex: August 31, 1960 (55 y.o. Male) Treating RN: Baruch Gouty, RN, BSN, Velva Harman Primary Care Physician: Gavin Pound Other Clinician: Referring Physician: Gavin Pound Treating Physician/Extender: Frann Rider in Treatment: 13 Vascular Assessment Pulses: Posterior Tibial Dorsalis Pedis Palpable: [Left:Yes] Extremity colors, hair growth, and conditions: Extremity Color: [Left:Dusky] Hair Growth on Extremity: [Left:No] Temperature  of Extremity: [Left:Warm] Toe Nail Assessment Left: Right: Thick: Yes Deformed: No Improper Length and Hygiene: No Electronic Signature(s) Signed: 06/07/2016 3:48:43 PM By: Regan Lemming BSN, RN Entered By: Regan Lemming on 06/07/2016 14:13:21 [name] ([id number]) -------------------------------------------------------------------------------- Multi Wound Chart Details Patient  Name: CHASEN, ERTL. Date of Service: 06/07/2016 1:45 PM Medical Record Number: [id number] Patient Account Number: [id number] Date of Birth/Sex: [birth year] (55 y.o. 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] [N/A:N/A] Comorbid History: [6:Hypertension, Type II Diabetes, Neuropathy] [N/A:N/A] Date Acquired: [6:03/25/2016] [N/A:N/A] Weeks of Treatment: [6:7] [N/A:N/A] Wound Status: [6:Open] [N/A:N/A] Measurements L x W x D 0.5x1x0.5 [N/A:N/A] (cm) Area (cm) : [6:0.393] [N/A:N/A] Volume (cm) : [6:0.196] [N/A:N/A] % Reduction in Area: [6:91.70%] [N/A:N/A] % Reduction in Volume: 79.20% [N/A:N/A] Classification: [6:Grade 1] [N/A:N/A] Exudate Amount: [6:Large] [N/A:N/A] Exudate Type: [6:Serosanguineous] [N/A:N/A] Exudate Color: [6:red, brown] [N/A:N/A] Wound Margin: [6:Thickened] [N/A:N/A] Granulation Amount: [6:Large (67-100%)] [N/A:N/A] Granulation Quality: [6:Pink, Pale] [N/A:N/A] Necrotic Amount: [6:Small (1-33%)] [N/A:N/A] Exposed Structures: [6:Fascia: No Fat: No Tendon: No  Muscle: No Joint: No Bone: No] [N/A:N/A] Limited to Skin Breakdown Epithelialization: Small (1-33%) N/A N/A Periwound Skin Texture: Callus: Yes N/A N/A Edema: No Excoriation: No Induration: No Crepitus: No Fluctuance: No Friable: No Rash: No Scarring: No Periwound Skin Moist: Yes N/A N/A Moisture: Maceration: No Dry/Scaly: No Periwound Skin Color: Atrophie Blanche: No N/A N/A Cyanosis: No Ecchymosis: No Erythema: No Hemosiderin Staining: No Mottled: No Pallor: No Rubor: No Temperature: No Abnormality N/A N/A Tenderness on Yes N/A N/A Palpation: Wound Preparation: Ulcer Cleansing: N/A N/A Rinsed/Irrigated with Saline Topical Anesthetic Applied: Other: Lidocaine 4% Treatment Notes Electronic Signature(s) Signed: 06/07/2016 3:48:43 PM By: Regan Lemming BSN, RN Entered By: Regan Lemming on 06/07/2016 14:13:55 [name] ([id number]) -------------------------------------------------------------------------------- [place] Details Patient Name: EFFREM, DIBB. Date of Service: 06/07/2016 1:45 PM Medical Record Number: [id number] Patient Account Number: [id number] Date of Birth/Sex: 1961/06/04 (55 y.o. Male) Treating RN: Baruch Gouty, RN, BSN, Velva Harman Primary Care Physician: Gavin Pound Other Clinician: Referring Physician: Gavin Pound Treating Physician/Extender: Frann Rider in Treatment: 13 Active Inactive Orientation to the Wound Care Program Nursing Diagnoses: Knowledge deficit related to the wound healing center program Goals: Patient/caregiver will verbalize understanding of the [facility] Date Initiated: 04/16/2016 Goal Status: Active Interventions: Provide education on orientation to the wound center Notes: Wound/Skin Impairment Nursing Diagnoses: Impaired tissue integrity Knowledge deficit related to ulceration/compromised skin integrity Goals: Patient/caregiver will verbalize understanding of skin care  regimen Date Initiated: 04/16/2016 Goal Status: Active Ulcer/skin breakdown will have a volume reduction of 30% by week 4 Date Initiated: 04/16/2016 Goal Status: Active Ulcer/skin breakdown will have a volume reduction of 50% by week 8 Date Initiated: 04/16/2016 Goal Status: Active Ulcer/skin breakdown will have a volume reduction of 80% by week 12 Date Initiated: 04/16/2016 Goal Status: Active Ulcer/skin breakdown will heal within 14 weeks Date Initiated: 04/16/2016 XANE, MARUSKA ([id number]) Goal Status: Active Interventions: Assess patient/caregiver ability to obtain necessary supplies Assess patient/caregiver ability to perform ulcer/skin care regimen upon admission and as needed Assess ulceration(s) every visit Provide education on ulcer and skin care Treatment Activities: Skin care regimen initiated : 03/06/2016 Topical wound management initiated : 03/06/2016 Notes: Electronic Signature(s) Signed: 06/07/2016 3:48:43 PM By: Regan Lemming BSN, RN Entered By: Regan Lemming on 06/07/2016 14:13:49 [name] ([id number]) -------------------------------------------------------------------------------- Pain Assessment Details Patient Name: [name]. Date of  Service: 06/07/2016 1:45 PM Medical Record Number: [id number] Patient Account Number: [id number] Date of Birth/Sex: 08-31-1960 (55 y.o. Male) Treating RN: Baruch Gouty, RN, BSN, Velva Harman Primary Care Physician: Gavin Pound Other Clinician: Referring Physician: Gavin Pound Treating Physician/Extender: Frann Rider in Treatment: 13 Active Problems Location of Pain Severity and Description of Pain Patient Has Paino No Site Locations With Dressing Change: No Pain Management and Medication Current Pain Management: Electronic Signature(s) Signed: 06/07/2016 3:48:43 PM By: Regan Lemming BSN, RN Entered By: Regan Lemming on 06/07/2016 14:01:19 [name]  ([id number]) -------------------------------------------------------------------------------- Patient/Caregiver Education Details Patient Name: [name]. Date of Service: 06/07/2016 1:45 PM Medical Record Number: [id number] Patient Account Number: [id number] Date of Birth/Gender: 10-13-1960 (55 y.o. Male) Treating RN: Baruch Gouty, RN, BSN, Velva Harman Primary Care Physician: Gavin Pound Other Clinician: Referring Physician: Gavin Pound Treating Physician/Extender: Frann Rider in Treatment: 13 Education Assessment Education Provided To: Patient Education Topics Provided Welcome To [facility]: Methods: Explain/Verbal Responses: State content correctly Wound/Skin Impairment: Methods: Explain/Verbal Responses: State content correctly Electronic Signature(s) Signed: 06/07/2016 3:48:43 PM By: Regan Lemming BSN, RN Entered By: Regan Lemming on 06/07/2016 15:48:33 [name] ([id number]) -------------------------------------------------------------------------------- Wound Assessment Details Patient Name: [name]. Date of Service: 06/07/2016 1:45 PM Medical Record Number: [id number] Patient Account Number: [id number] Date of Birth/Sex: January 14, 1961 (55 y.o. Male) Treating RN: Baruch Gouty, RN, BSN, [place] Primary Care Physician: Gavin Pound Other Clinician: Referring Physician: Gavin Pound Treating Physician/Extender: Frann Rider in Treatment: 13 Wound Status Wound Number: 6 Primary Diabetic Wound/Ulcer of the Lower Etiology: Extremity Wound Location: Left Foot - Lateral Wound Status: Open Wounding Event: Gradually Appeared Comorbid Hypertension, Type II Diabetes, Date Acquired: 03/25/2016 History: Neuropathy Weeks Of Treatment: 7 Clustered Wound: No Photos Photo Uploaded By: Regan Lemming on 06/07/2016 15:17:14 Wound Measurements Length: (cm) 0.5 Width: (cm) 1 Depth: (cm) 0.5 Area: (cm) 0.393 Volume: (cm) 0.196 % Reduction in Area: 91.7% %  Reduction in Volume: 79.2% Epithelialization: Small (1-33%) Tunneling: No Undermining: No Wound Description Classification: Grade 1 Wound Margin: Thickened Exudate Amount: Large Exudate Type: Serosanguineous Exudate Color: red, brown KYREL, BREES. ([id number]) Foul Odor After Cleansing: No Wound Bed Granulation Amount: Large (67-100%) Exposed Structure Granulation Quality: Pink, Pale Fascia Exposed: No Necrotic Amount: Small (1-33%) Fat Layer Exposed: No Necrotic Quality: Adherent Slough Tendon Exposed: No Muscle Exposed: No Joint Exposed: No Bone Exposed: No Limited to Skin Breakdown Periwound Skin Texture Texture Color No Abnormalities Noted: No No Abnormalities Noted: No Callus: Yes Atrophie Blanche: No Crepitus: No Cyanosis: No Excoriation: No Ecchymosis: No Fluctuance: No Erythema: No Friable: No Hemosiderin Staining: No Induration: No Mottled: No Localized Edema: No Pallor: No Rash: No Rubor: No Scarring: No Temperature / Pain Moisture Temperature: No Abnormality No Abnormalities Noted: No Tenderness on Palpation: Yes Dry / Scaly: No Maceration: No Moist: Yes Wound Preparation Ulcer Cleansing: Rinsed/Irrigated with Saline Topical Anesthetic Applied: Other: Lidocaine 4%, Treatment Notes Wound #6 (Left, Lateral Foot) 1. Cleansed with: Cleanse wound with antibacterial soap and water 3. Peri-wound Care: Moisturizing lotion 4. Dressing Applied: Aquacel Ag 5. Secondary Dressing Applied Foam Gauze and Kerlix/Conform 6. Footwear/Offloading device applied Removable Cast [address] MARLOWE, LIPPOLD ([id number]) Other footwear/offloading device applied (specify in notes) Notes [facility] Electronic Signature(s) Signed: 06/07/2016 3:48:43 PM By: Regan Lemming BSN, RN Entered By: Regan Lemming on 06/07/2016 14:13:44 [name] ([id number]) -------------------------------------------------------------------------------- Vitals Details Patient  Name: [name]. Date of Service: 06/07/2016 1:45 PM Medical Record Number: [id number] Patient Account Number: [id number] Date  of Birth/Sex: 18-May-1961 (55 y.o. Male) Treating RN: Afful, RN, BSN, Velva Harman Primary Care Physician: Gavin Pound Other Clinician: Referring Physician: Gavin Pound Treating Physician/Extender: Frann Rider in Treatment: 13 Vital Signs Time Taken: 14:01 Temperature (F): 98.4 Height (in): 70 Pulse (bpm): 99 Weight (lbs): 260 Respiratory Rate (breaths/min): 18 Body Mass Index (BMI): 37.3 Blood Pressure (mmHg): 154/85 Reference Range: 80 - 120 mg / dl Electronic Signature(s) Signed: 06/07/2016 3:48:43 PM By: Regan Lemming BSN, RN Entered By: Regan Lemming on 06/07/2016 14:01:41

## 2016-06-12 ENCOUNTER — Encounter: Payer: Managed Care, Other (non HMO) | Admitting: Internal Medicine

## 2016-06-12 DIAGNOSIS — E11621 Type 2 diabetes mellitus with foot ulcer: Secondary | ICD-10-CM | POA: Diagnosis not present

## 2016-06-13 NOTE — Progress Notes (Addendum)
SHAFT, TIA ([id number]) Visit Report for 06/12/2016 Chief Complaint Document Details Patient Name: [name], [name]. Date of Service: 06/12/2016 9:00 AM Medical Record Patient Account Number: [id number] Number: Treating RN: Afful, RN, BSN, Rita [birth year] (55 y.o. Other Clinician: Date of Birth/Sex: Male) Treating Rupert Azzara Primary Care Physician/Extender: Garlan Fair Physician: Referring Physician: Judeth Horn in Treatment: 14 Information Obtained from: Patient Chief Complaint 03/06/16; patient returns today after further surgery on the left foot as well as a 6 week course of IV antibiotics Electronic Signature(s) Signed: 06/12/2016 4:39:00 PM By: [name] MD Entered By: [name] on 06/12/2016 10:24:02 [name] ([id number]) -------------------------------------------------------------------------------- HPI Details Patient Name: [name], [name]. Date of Service: 06/12/2016 9:00 AM Medical Record Patient Account Number: [id number] Number: Treating RN: Afful, RN, BSN, Rita 13-Oct-1960 (55 y.o. Other Clinician: Date of Birth/Sex: Male) Treating [name] Primary Care Physician/Extender: Ali Lowe, ADAKU Physician: Referring Physician: Gavin Pound Weeks in Treatment: 14 History of Present Illness HPI Description: Pleasant 55 year old with history of diabetes (Hgb A1c 10.8 in 2014) and peripheral neuropathy. No PVD. L ABI 1.1. Status post right great toe partial amputation years ago. He was at work and on 10/22/2014, was injured by a cart, and suffered an ulceration to his left anterior calf. He says that it subsequently became infected, and he was treated with a course of antibiotics. He was found on initial exam to have an ulceration on the dorsum of his left third toe. He was unaware of this and attributes it to pressure from his steel toed boots. More recently he injured his right anterior calf on a cart.  Ambulating normally per his baseline. He has been undergoing regular debridements, applying mupirocin cream, and an Ace wrap for edema control. He returns to clinic for follow-up and is without complaints. No pain. No fever or chills. No drainage. 10/25/15; this is a 55 year old man who has type II diabetes with diabetic polyneuropathy. He tells me that he fractured his left fifth metatarsal in June 2016 when he presented with swelling. He does not recall a specific injury. His hemoglobin A1c was apparently too high at the time for consideration of surgery and he was put in some form of offloading. Ultimately he went to surgery in December with an allograft from his calcaneus to this site, plate and screws. He had an x-ray of the foot in March that showed concerns about nonunion. He tells me that in March he had to move and basically moved himself. He was on his foot a lot and then noticed some drainage from an open area. He has been following with his orthopedic surgeon Dr. Doran Durand. He has been applying a felt donut, dry dressing and using his heel healing sandal. 11/01/15; this is a patient I saw last week for the first time. He had a small open wound on the plantar aspect of his left foot at roughly the level of the base of his fifth metatarsal. He had a considerable degree of thickened skin around this wound on the plantar aspect which I thought was from chronic pressure on this area. He tells [place] that he had drainage over the course of the week. No systemic symptoms. 11/08/15; culture last week grew Citrobacter korseri. This should've been sensitive to the Augmentin I gave him. He has seen Dr. Doran Durand who did his initial surgery and according the patient the plan is to give this another month and then the hardware might need to come out of  this. This seems like a reasonable plan. I will adjust his antibiotics to ciprofloxacin which probably should continue for at least another 2 weeks. I gave him  10 days worth today 11/22/15 the patient has completed antibiotics. He has an appointment with Dr. Doran Durand this Friday. There is improved dimensions around the wound on the left fifth metatarsal base 11/29/15; the patient has completed antibiotics last week. Apparently his appointment with Dr. Doran Durand it is not until this Friday. Dimensions are roughly the same. 12/06/15; saw Dr. Doran Durand. No x-ray told the end of the month, next appointment June 30. We have been using Aquacel Ag 12/13/15: No major change this week. Using Aquacel AG [name], [name]. ([id number]) 621/17; arrived this week with maceration around the wound. There was quite a bit of undermining which required surgical debridement. I changed him to [facility] last week, by the patient's admission he was up on this more this week 12/27/15; macerated tissue around the wound is removed with a scalpel and pickups. There is no undermining. Nonviable subcutaneous tissue and skin taken from the superior circumference of the wound is slough from the surface. READMISSION 03/06/16 since I last saw this patient at the end of June, he went for surgery on 01/11/16 by Dr. Doran Durand of orthopedics. He had a left foot irrigation and debridement, removal of hardware and placement of wound VAC. He is also been followed by Dr. Megan Salon of infectious disease and completed a six-week course of IV Rocephin for group B strep and Enterobacter in the bone at the time of surgery. Apparently at the time of surgery the bone looked healthy so I don't think any bone was actually removed. He has been using silver alginate based dressings on the same wound area at the base of the left fifth metatarsal on the left. I note that he is also had arterial studies on 01/08/16, these showed a left ABI of 1.2 to and a right ABI of 1.3. Waveforms were listed as biphasic. He was not felt to have any specific arterial issues. 03/13/16; no real change in the condition of the wound at the  left lateral foot at roughly the base of his fifth metatarsal. Use silver alginate last week. 03/18/16 arrives today with no open area. Being suspicious of the overlying callus I. Some of this back although I see nothing but covering tissue here/epithelium. There is no surrounding tenderness READMISSION 04/16/16 this is a patient I discharged about a month ago. Initially a surgical wound on the lateral aspect of his left foot which subsequently became infected. The story he is giving today that he went back into his own modified shoe started to notice pain 2 weeks ago he was seen in Dr. Nona Dell office by a physician assistant last Wednesday and according the patient was told that everything looked fine however this is clearly now broken open and he has an open wound in the same spot that we have been dealing with repetitively. Situation is complicated by the fact that he is running short of money on long-term disability. He has not taken his insulin and at least 2 weeks was previously on NovoLog short acting insulin on a sliding scale and TRESIBA 35 units at bedtime. He is no longer able to afford any of his medication he was in the x-ray on 10/15. A plain x-ray showed healed fracture of the left fifth metatarsal bone status post removal of the associated plate and screw fixation hardware. There was no acute appearing osseous abnormality.  His blood work showed a white count of 9.2 with an essentially normal differential comprehensive metabolic panel was normal. Previous CT scan of the foot on 01/08/16 showed no osteomyelitis previous vascular workup showed no evidence of significant PAD on 01/13/16 04/23/16; culture I did last week grew enterococcus [ampicillin sensitive] and MRSA. He saw infectious disease yesterday. They stop the clindamycin and ordered an MRI. This is not unreasonable. All the hardware is out of the foot at this point. 04/30/16 at this point in time we are still awaiting the  results of the MRI at this point in time. Patient did have an area which appears to be somewhat macerated in the proximal portion of the wound where there is overlying necrotic skin that is doing nothing more than trapping fluid underneath. He continues to state that he does have some discomfort and again the concern is for the possibility of osteomyelitis hence the reason for the MRI order. We have been using a silver alginate dressing but again I think the reason this with his macerated as it was is that the dressing obscene could not reach the entirety of the wound due to some necrotic skin covering the proximal portion. No bleeding noted at this point in time on initial evaluation. 05/07/16 we receive the results of patient's MRI which shows that he has no evidence of osteomyelitis. This obviously is excellent news. Patient was definitely happy to hear this. He tells me the wound appears to be doing very well at this point in time and is pleased with the progress currently. [name], [name] ([id number]) 05/15/16; as noted the patient did not have osteomyelitis. He has been released by infectious disease and orthopedics. His wound is still open he had a debridement last week but complain when he got home he "bleeding for half a day". He is not had any pain. We have been using silver alginate with Kerlix gauze wrap. 05/28/16; patient arrives today with the wound in much the same condition as last time. He has a small opening on the lateral aspect of his foot however with debridement there is clear undermining medially there is no real evidence of infection here and I didn't see any point in culturing this. One would have to wonder if this isn't a simple matter of in adequate offloading as he is been using a healing sandal. 06/05/16; the open area is now on the plantar aspect of his foot and not decide. The wound almost appears to have "migrated". This was the term use by our intake  nurse. 06/12/16; open area on the plantar aspect of his foot. Base of the wound looks very healthy. This will be his second week in a total contact cast Electronic Signature(s) Signed: 06/12/2016 4:39:00 PM By: [name] MD Entered By: [name] on 06/12/2016 10:25:03 [name] ([id number]) -------------------------------------------------------------------------------- Physical Exam Details Patient Name: [name], [name]. Date of Service: 06/12/2016 9:00 AM Medical Record Patient Account Number: [id number] Number: Treating RN: Afful, RN, BSN, Rita [birth year] (55 y.o. Other Clinician: Date of Birth/Sex: Male) Treating [name] Primary Care Physician/Extender: Ali Lowe, ADAKU Physician: Referring Physician: Gavin Pound Weeks in Treatment: 63 Constitutional Patient is hypertensive.. Pulse regular and within target range for patient.Marland Kitchen Respirations regular, non-labored and within target range.. Temperature is normal and within the target range for the patient.. Patient's appearance is neat and clean. Appears in no acute distress. Well nourished and well developed.. Notes Wound exam; the patient's wound looks very healthy no undermining. This  seems to be responding. No evidence of infection Electronic Signature(s) Signed: 06/12/2016 4:39:00 PM By: [name] MD Entered By: [name] on 06/12/2016 10:26:01 [name] ([id number]) -------------------------------------------------------------------------------- Physician Orders Details Patient Name: [name], [name]. Date of Service: 06/12/2016 9:00 AM Medical Record Patient Account Number: [id number] Number: Treating RN: Afful, RN, BSN, Rita 25-Sep-1960 (55 y.o. Other Clinician: Date of Birth/Sex: Male) Treating [name] Primary Care Physician/Extender: Ali Lowe, ADAKU Physician: Referring Physician: Judeth Horn in Treatment: 96 Verbal / Phone Orders:  Yes Clinician: Afful, RN, BSN, Rita Read Back and Verified: Yes Diagnosis Coding Wound Cleansing Wound #6 Left,Lateral Foot o Cleanse wound with mild soap and water o May Shower, gently pat wound dry prior to applying new dressing. o May shower with protection. Anesthetic Wound #6 Left,Lateral Foot o Topical Lidocaine 4% cream applied to wound bed prior to debridement Primary Wound Dressing Wound #6 Left,Lateral Foot o Prisma Ag Secondary Dressing Wound #6 Left,Lateral Foot o Gauze and Kerlix/Conform o Foam Dressing Change Frequency Wound #6 Left,Lateral Foot o Change dressing every day. Follow-up Appointments Wound #6 Left,Lateral Foot o Return Appointment in 1 week. Off-Loading Wound #6 Left,Lateral Foot o Total Contact Cast to Left Lower Extremity o Other: - felt [name], [name]. ([id number]) Additional Orders / Instructions Wound #6 Left,Lateral Foot o Increase protein intake. o Activity as tolerated Electronic Signature(s) Signed: 06/12/2016 4:39:00 PM By: [name] MD Signed: 06/12/2016 5:11:17 PM By: Regan Lemming BSN, RN Entered By: Regan Lemming on 06/12/2016 10:20:12 [name] ([id number]) -------------------------------------------------------------------------------- Problem List Details Patient Name: [name], [name]. Date of Service: 06/12/2016 9:00 AM Medical Record Patient Account Number: [id number] Number: Treating RN: Afful, RN, BSN, Rita Aug 07, 1960 (55 y.o. 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 Date of Service: 06/12/2016 9:00 AM Medical Record Patient Account Number: [id number] Number: Treating RN: Afful, RN, BSN, Rita Nov 06, 1960 (55 y.o. Other Clinician: Date of Birth/Sex: Male) Treating [name] Heyer Primary Care Physician/Extender: Garlan Fair Physician: Referring Physician: Judeth Horn in Treatment: 14 Subjective Chief Complaint Information obtained from Patient 03/06/16; patient returns today after further surgery on the left foot as well as a 6 week course of IV antibiotics History of Present Illness (HPI) Pleasant 55 year old with history of diabetes (Hgb A1c 10.8 in 2014) and peripheral neuropathy. No PVD. L ABI 1.1. Status post right great toe partial amputation years ago. He was at work and on 10/22/2014, was injured by a cart, and suffered an ulceration to his left anterior calf. He says that it subsequently became infected, and he was treated with a course of antibiotics. He was found on initial exam to have an ulceration on the dorsum of his left third toe. He was unaware of this and attributes it to pressure from his steel toed boots. More recently he injured his right anterior calf on a cart. Ambulating normally per his  baseline. He has been undergoing regular debridements, applying mupirocin cream, and an Ace wrap for edema control. He returns to clinic for follow-up and is without complaints. No pain. No fever or chills. No drainage. 10/25/15; this is a 55 year old man who has type II diabetes  with diabetic polyneuropathy. He tells me that he fractured his left fifth metatarsal in June 2016 when he presented with swelling. He does not recall a specific injury. His hemoglobin A1c was apparently too high at the time for consideration of surgery and he was put in some form of offloading. Ultimately he went to surgery in December with an allograft from his calcaneus to this site, plate and screws. He had an x-ray of the foot in March that showed concerns about nonunion. He tells me that in March he had to move and basically moved himself. He was on his foot a lot and then noticed some drainage from an open area. He has been following with his orthopedic surgeon Dr. Doran Durand. He has been applying a felt donut, dry dressing and using his heel healing sandal. 11/01/15; this is a patient I saw last week for the first time. He had a small open wound on the plantar aspect of his left foot at roughly the level of the base of his fifth metatarsal. He had a considerable degree of thickened skin around this wound on the plantar aspect which I thought was from chronic pressure on this area. He tells [place] that he had drainage over the course of the week. No systemic symptoms. 11/08/15; culture last week grew Citrobacter korseri. This should've been sensitive to the Augmentin I gave him. He has seen Dr. Doran Durand who did his initial surgery and according the patient the plan is to give this another month and then the hardware might need to come out of this. This seems like a reasonable plan. I will adjust his antibiotics to ciprofloxacin which probably should continue for at least another 2 weeks. I gave him 10 days worth today 11/22/15 the patient has completed antibiotics. He has an appointment with Dr. Doran Durand this Friday. There is TERREN, KALLSTROM ([id number]) improved dimensions around the wound on the left fifth metatarsal base 11/29/15; the patient has completed antibiotics last week. Apparently his  appointment with Dr. Doran Durand it is not until this Friday. Dimensions are roughly the same. 12/06/15; saw Dr. Doran Durand. No x-ray told the end of the month, next appointment June 30. We have been using Aquacel Ag 12/13/15: No major change this week. Using Aquacel AG 621/17; arrived this week with maceration around the wound. There was quite a bit of undermining which required surgical debridement. I changed him to [facility] last week, by the patient's admission he was up on this more this week 12/27/15; macerated tissue around the wound is removed with a scalpel and pickups. There is no undermining. Nonviable subcutaneous tissue and skin taken from the superior circumference of the wound is slough from the surface. READMISSION 03/06/16 since I last saw this patient at the end of June, he went for surgery on 01/11/16 by Dr. Doran Durand of orthopedics. He had a left foot irrigation and debridement, removal of hardware and placement of wound VAC. He is also been followed by Dr. Megan Salon of infectious disease and completed a six-week course of IV Rocephin for group B strep and Enterobacter in the bone at the time of surgery. Apparently at the time of surgery the bone looked healthy so I don't think any bone was actually  removed. He has been using silver alginate based dressings on the same wound area at the base of the left fifth metatarsal on the left. I note that he is also had arterial studies on 01/08/16, these showed a left ABI of 1.2 to and a right ABI of 1.3. Waveforms were listed as biphasic. He was not felt to have any specific arterial issues. 03/13/16; no real change in the condition of the wound at the left lateral foot at roughly the base of his fifth metatarsal. Use silver alginate last week. 03/18/16 arrives today with no open area. Being suspicious of the overlying callus I. Some of this back although I see nothing but covering tissue here/epithelium. There is no surrounding  tenderness READMISSION 04/16/16 this is a patient I discharged about a month ago. Initially a surgical wound on the lateral aspect of his left foot which subsequently became infected. The story he is giving today that he went back into his own modified shoe started to notice pain 2 weeks ago he was seen in Dr. Nona Dell office by a physician assistant last Wednesday and according the patient was told that everything looked fine however this is clearly now broken open and he has an open wound in the same spot that we have been dealing with repetitively. Situation is complicated by the fact that he is running short of money on long-term disability. He has not taken his insulin and at least 2 weeks was previously on NovoLog short acting insulin on a sliding scale and TRESIBA 35 units at bedtime. He is no longer able to afford any of his medication he was in the x-ray on 10/15. A plain x-ray showed healed fracture of the left fifth metatarsal bone status post removal of the associated plate and screw fixation hardware. There was no acute appearing osseous abnormality. His blood work showed a white count of 9.2 with an essentially normal differential comprehensive metabolic panel was normal. Previous CT scan of the foot on 01/08/16 showed no osteomyelitis previous vascular workup showed no evidence of significant PAD on 01/13/16 04/23/16; culture I did last week grew enterococcus [ampicillin sensitive] and MRSA. He saw infectious disease yesterday. They stop the clindamycin and ordered an MRI. This is not unreasonable. All the hardware is out of the foot at this point. 04/30/16 at this point in time we are still awaiting the results of the MRI at this point in time. Patient did have an area which appears to be somewhat macerated in the proximal portion of the wound where there is overlying necrotic skin that is doing nothing more than trapping fluid underneath. He continues to state that he does have  some discomfort and again the concern is for the possibility of osteomyelitis hence the reason PHILLIPS, GRIM. ([id number]) for the MRI order. We have been using a silver alginate dressing but again I think the reason this with his macerated as it was is that the dressing obscene could not reach the entirety of the wound due to some necrotic skin covering the proximal portion. No bleeding noted at this point in time on initial evaluation. 05/07/16 we receive the results of patient's MRI which shows that he has no evidence of osteomyelitis. This obviously is excellent news. Patient was definitely happy to hear this. He tells me the wound appears to be doing very well at this point in time and is pleased with the progress currently. 05/15/16; as noted the patient did not have osteomyelitis. He has been released by  infectious disease and orthopedics. His wound is still open he had a debridement last week but complain when he got home he "bleeding for half a day". He is not had any pain. We have been using silver alginate with Kerlix gauze wrap. 05/28/16; patient arrives today with the wound in much the same condition as last time. He has a small opening on the lateral aspect of his foot however with debridement there is clear undermining medially there is no real evidence of infection here and I didn't see any point in culturing this. One would have to wonder if this isn't a simple matter of in adequate offloading as he is been using a healing sandal. 06/05/16; the open area is now on the plantar aspect of his foot and not decide. The wound almost appears to have "migrated". This was the term use by our intake nurse. 06/12/16; open area on the plantar aspect of his foot. Base of the wound looks very healthy. This will be his second week in a total contact cast Objective Constitutional Patient is hypertensive.. Pulse regular and within target range for patient.Marland Kitchen Respirations regular, non-labored and  within target range.. Temperature is normal and within the target range for the patient.. Patient's appearance is neat and clean. Appears in no acute distress. Well nourished and well developed.. Vitals Time Taken: 9:24 AM, Height: 70 in, Weight: 260 lbs, BMI: 37.3, Temperature: 98.2 F, Pulse: 96 bpm, Respiratory Rate: 17 breaths/min, Blood Pressure: 157/85 mmHg. General Notes: Wound exam; the patient's wound looks very healthy no undermining. This seems to be responding. No evidence of infection Integumentary (Hair, Skin) Wound #6 status is Open. Original cause of wound was Gradually Appeared. The wound is located on the Left,Lateral Foot. The wound measures 0.5cm length x 0.7cm width x 0.4cm depth; 0.275cm^2 area and 0.11cm^3 volume. The wound is limited to skin breakdown. There is no tunneling or undermining noted. There is a large amount of serosanguineous drainage noted. The wound margin is thickened. There is large (67-100%) pink, pale granulation within the wound bed. There is a small (1-33%) amount of necrotic tissue within the wound bed including Adherent Slough. The periwound skin appearance exhibited: Callus, Moist. The periwound skin appearance did not exhibit: Crepitus, Excoriation, Fluctuance, Friable, Induration, Localized Edema, Rash, Scarring, Dry/Scaly, Maceration, Atrophie Blanche, Cyanosis, Ecchymosis, Hemosiderin Staining, Mottled, Pallor, Rubor, Erythema. Periwound temperature was noted as No Abnormality. 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 There was a Total Contact Cast Procedure by Ricard Dillon, MD. Post procedure Diagnosis Wound #6: Same as Pre-Procedure Plan Wound Cleansing: Wound  #6 Left,Lateral Foot: Cleanse wound with mild soap and water May Shower, gently pat wound dry prior to applying new dressing. May shower with protection. Anesthetic: Wound #6 Left,Lateral Foot: Topical Lidocaine 4% cream applied to wound bed prior to debridement Primary Wound Dressing: Wound #6 Left,Lateral Foot: Prisma Ag Secondary Dressing: Wound #6 Left,Lateral Foot: Gauze and Kerlix/Conform Foam Dressing Change Frequency: Wound #6 Left,Lateral Foot: Change dressing every day. Follow-up Appointments: Wound #6 Left,Lateral Foot: THELMER, KLITZ ([id number]) Return Appointment in 1 week. Off-Loading: Wound #6 Left,Lateral Foot: Total Contact Cast to Left Lower Extremity Other: - felt Additional Orders / Instructions: Wound #6 Left,Lateral Foot: Increase protein intake. Activity as  tolerated prisma to the wound bed as this appears to be granulating. gauze foam TCC reapplied Electronic Signature(s) Signed: 06/17/2016 5:00:49 PM By: Gretta Cool RN, BSN, Kim RN, BSN Signed: 06/18/2016 5:07:10 PM By: [name] MD Previous Signature: 06/12/2016 4:39:00 PM Version By: [name] MD Entered By: Gretta Cool RN, BSN, Kim on 06/17/2016 17:00:49 [name] ([id number]) -------------------------------------------------------------------------------- Total Contact Cast Details Patient Name: NORI, KUNSELMAN. Date of Service: 06/12/2016 9:00 AM Medical Record Patient Account Number: [id number] Number: Treating RN: Afful, RN, BSN, Rita 01-Aug-1960 (55 y.o. Other Clinician: Date of Birth/Sex: Male) Treating Maryanna Stuber Primary Care Physician/Extender: Ali Lowe, ADAKU Physician: Referring Physician: Judeth Horn in Treatment: 14 Total Contact Cast Applied for Wound Wound #6 Left,Lateral Foot Assessment: Performed By: Physician Ricard Dillon, MD Post Procedure Diagnosis Same as Pre-procedure Electronic Signature(s) Signed: 06/12/2016 4:39:00 PM By:  [name] MD Entered By: [name] on 06/12/2016 10:28:02 [name] ([id number]) -------------------------------------------------------------------------------- [place] Details Patient Name: [name]. Date of Service: 06/12/2016 Medical Record Patient Account Number: [id number] Number: Treating RN: Baruch Gouty, RN, BSN, Rita 1961-03-21 (55 y.o. Other Clinician: Date of Birth/Sex: Male) Treating Candice Tobey Primary Care Physician: Gavin Pound Physician/Extender: G Referring Physician: Judeth Horn in Treatment: 14 Diagnosis Coding ICD-10 Codes Code Description E11.621 Type 2 diabetes mellitus with foot ulcer L97.523 Non-pressure chronic ulcer of other part of left foot with necrosis of muscle Facility Procedures CPT4 Code Description: [id number] - APPLY TOTAL CONTACT LEG CAST ICD-10 Description Diagnosis E11.621 Type 2 diabetes mellitus with foot ulcer L97.523 Non-pressure chronic ulcer of other part of left foot Modifier: with necrosi Quantity: 1 s of muscle Physician Procedures CPT4 Code Description: [id number] - WC PHYS APPLY TOTAL CONTACT CAST ICD-10 Description Diagnosis E11.621 Type 2 diabetes mellitus with foot ulcer L97.523 Non-pressure chronic ulcer of other part of left foot Modifier: with necrosis Quantity: 1 of muscle Electronic Signature(s) Signed: 06/12/2016 4:39:00 PM By: [name] MD Entered By: [name] on 06/12/2016 10:28:40

## 2016-06-13 NOTE — Progress Notes (Signed)
MARREO, USELMAN ([id number]) Visit Report for 06/12/2016 Arrival Information Details Patient Name: [name], [name]. Date of Service: 06/12/2016 9:00 AM Medical Record Patient Account Number: [id number] Number: Treating RN: Afful, RN, BSN, Rita 1960-12-24 (55 y.o. Other Clinician: Date of Birth/Sex: Male) Treating ROBSON, [place] Primary Care Physician: NNODI, ADAKU Physician/Extender: G Referring Physician: Judeth Horn in Treatment: 14 Visit Information History Since Last Visit All ordered tests and consults were No Patient Arrived: Ambulatory completed: Arrival Time: 09:24 Added or deleted any medications: No Accompanied By: self Any new allergies or adverse reactions: No Transfer Assistance: None Had a fall or experienced change in No Patient Identification Verified: Yes activities of daily living that may affect Secondary Verification Process Yes risk of falls: Completed: Signs or symptoms of abuse/neglect since No Patient Requires Transmission-Based No last visito Precautions: Hospitalized since last visit: No Patient Has Alerts: Yes Has Dressing in Place as Prescribed: Yes Patient Alerts: DM II Has Footwear/Offloading in Place as Yes Prescribed: Left: Total Contact Cast Pain Present Now: No Electronic Signature(s) Signed: 06/12/2016 5:11:17 PM By: Regan Lemming BSN, RN Entered By: Regan Lemming on 06/12/2016 09:25:15 [name] ([id number]) -------------------------------------------------------------------------------- Encounter Discharge Information Details Patient Name: [name], [name]. Date of Service: 06/12/2016 9:00 AM Medical Record Patient Account Number: [id number] Number: Treating RN: Afful, RN, BSN, Rita 1961/01/19 (55 y.o. Other Clinician: Date of Birth/Sex: Male) Treating ROBSON, MICHAEL Primary Care Physician: Gavin Pound Physician/Extender: G Referring Physician: Judeth Horn in Treatment: 14 Encounter  Discharge Information Items Discharge Pain Level: 0 Discharge Condition: Stable Ambulatory Status: Ambulatory Discharge Destination: Home Transportation: Private Auto Accompanied By: self Schedule Follow-up Appointment: No Medication Reconciliation completed and provided to Patient/Care No [name]: Provided on Clinical Summary of Care: 06/12/2016 Form Type Recipient Paper Patient HP Electronic Signature(s) Signed: 06/12/2016 11:27:48 AM By: Regan Lemming BSN, RN Previous Signature: 06/12/2016 10:44:11 AM Version By: Ruthine Dose Entered By: Regan Lemming on 06/12/2016 11:27:48 [name] ([id number]) -------------------------------------------------------------------------------- Lower Extremity Assessment Details Patient Name: [name]. Date of Service: 06/12/2016 9:00 AM Medical Record Patient Account Number: [id number] Number: Treating RN: Afful, RN, BSN, Rita [birth year] (55 y.o. Other Clinician: Date of Birth/Sex: Male) Treating ROBSON, MICHAEL Primary Care Physician: Gavin Pound Physician/Extender: G Referring Physician: Gavin Pound Weeks in Treatment: 14 Vascular Assessment Pulses: Posterior Tibial Dorsalis Pedis Palpable: [Left:Yes] Extremity colors, hair growth, and conditions: Extremity Color: [Left:Normal] Hair Growth on Extremity: [Left:Yes] Temperature of Extremity: [Left:Warm] Capillary Refill: [Left:< 3 seconds] Toe Nail Assessment Left: Right: Thick: Yes Discolored: Yes Deformed: No Improper Length and Hygiene: No Electronic Signature(s) Signed: 06/12/2016 5:11:17 PM By: Regan Lemming BSN, RN Entered By: Regan Lemming on 06/12/2016 09:25:35 [name] ([id number]) -------------------------------------------------------------------------------- Multi Wound Chart Details Patient Name: [name]. Date of Service: 06/12/2016 9:00 AM Medical Record Patient Account Number: [id number] Number: Treating RN:  Afful, RN, BSN, Rita 28-Oct-1960 (55 y.o. Other Clinician: Date of Birth/Sex: Male) Treating ROBSON, MICHAEL Primary Care Physician: NNODI, Doreene Burke Physician/Extender: G Referring Physician: Gavin Pound Weeks in Treatment: 14 Vital Signs Height(in): 70 Pulse(bpm): 96 Weight(lbs): 260 Blood Pressure 157/85 (mmHg): Body Mass Index(BMI): 37 Temperature(F): 98.2 Respiratory Rate 17 (breaths/min): Photos: [6:No Photos] [N/A:N/A] Wound Location: [6:Left Foot - Lateral] [N/A:N/A] Wounding Event: [6:Gradually Appeared] [N/A:N/A] Primary Etiology: [6:Diabetic Wound/Ulcer of the Lower Extremity] [N/A:N/A] Comorbid History: [6:Hypertension, Type II Diabetes, Neuropathy] [N/A:N/A] Date Acquired: [6:03/25/2016] [N/A:N/A] Weeks of Treatment: [6:8] [N/A:N/A] Wound Status: [6:Open] [N/A:N/A] Measurements L x W x D 0.5x0.7x0.4 [N/A:N/A] (cm)  Area (cm) : [6:0.275] [N/A:N/A] Volume (cm) : [6:0.11] [N/A:N/A] % Reduction in Area: [6:94.20%] [N/A:N/A] % Reduction in Volume: 88.30% [N/A:N/A] Classification: [6:Grade 1] [N/A:N/A] Exudate Amount: [6:Large] [N/A:N/A] Exudate Type: [6:Serosanguineous] [N/A:N/A] Exudate Color: [6:red, brown] [N/A:N/A] Wound Margin: [6:Thickened] [N/A:N/A] Granulation Amount: [6:Large (67-100%)] [N/A:N/A] Granulation Quality: [6:Pink, Pale] [N/A:N/A] Necrotic Amount: [6:Small (1-33%)] [N/A:N/A] Exposed Structures: [6:Fascia: No Fat: No Tendon: No Muscle: No] [N/A:N/A] Joint: No Bone: No Limited to Skin Breakdown Epithelialization: Small (1-33%) N/A N/A Periwound Skin Texture: Callus: Yes N/A N/A Edema: No Excoriation: No Induration: No Crepitus: No Fluctuance: No Friable: No Rash: No Scarring: No Periwound Skin Moist: Yes N/A N/A Moisture: Maceration: No Dry/Scaly: No Periwound Skin Color: Atrophie Blanche: No N/A N/A Cyanosis: No Ecchymosis: No Erythema: No Hemosiderin Staining: No Mottled: No Pallor: No Rubor: No Temperature: No  Abnormality N/A N/A Tenderness on No N/A N/A Palpation: Wound Preparation: Ulcer Cleansing: N/A N/A Rinsed/Irrigated with Saline Topical Anesthetic Applied: Other: Lidocaine 4% Treatment Notes Electronic Signature(s) Signed: 06/12/2016 5:11:17 PM By: Regan Lemming BSN, RN Entered By: Regan Lemming on 06/12/2016 09:55:12 [name] ([id number]) -------------------------------------------------------------------------------- [place] Details Patient Name: [name], DESROCHERS. Date of Service: 06/12/2016 9:00 AM Medical Record Patient Account Number: [id number] Number: Treating RN: Afful, RN, BSN, Rita January 30, 1961 (54 y.o. Other Clinician: Date of Birth/Sex: Male) Treating ROBSON, [place] Primary Care Physician: Gavin Pound Physician/Extender: G Referring Physician: Judeth Horn in Treatment: 14 Active Inactive Orientation to the Wound Care Program Nursing Diagnoses: Knowledge deficit related to the wound healing center program Goals: Patient/caregiver will verbalize understanding of the [place] Program Date Initiated: 04/16/2016 Goal Status: Active Interventions: Provide education on orientation to the wound center Notes: Wound/Skin Impairment Nursing Diagnoses: Impaired tissue integrity Knowledge deficit related to ulceration/compromised skin integrity Goals: Patient/caregiver will verbalize understanding of skin care regimen Date Initiated: 04/16/2016 Goal Status: Active Ulcer/skin breakdown will have a volume reduction of 30% by week 4 Date Initiated: 04/16/2016 Goal Status: Active Ulcer/skin breakdown will have a volume reduction of 50% by week 8 Date Initiated: 04/16/2016 Goal Status: Active Ulcer/skin breakdown will have a volume reduction of 80% by week 12 Date Initiated: 04/16/2016 Goal Status: Active Ulcer/skin breakdown will heal within 14 weeks [name], [name] ([id number]) Date Initiated:  04/16/2016 Goal Status: Active Interventions: Assess patient/caregiver ability to obtain necessary supplies Assess patient/caregiver ability to perform ulcer/skin care regimen upon admission and as needed Assess ulceration(s) every visit Provide education on ulcer and skin care Treatment Activities: Skin care regimen initiated : 03/06/2016 Topical wound management initiated : 03/06/2016 Notes: Electronic Signature(s) Signed: 06/12/2016 5:11:17 PM By: Regan Lemming BSN, RN Entered By: Regan Lemming on 06/12/2016 09:38:53 [name] ([id number]) -------------------------------------------------------------------------------- Pain Assessment Details Patient Name: [name]. Date of Service: 06/12/2016 9:00 AM Medical Record Patient Account Number: [id number] Number: Treating RN: Afful, RN, BSN, Rita Jan 12, 1961 (55 y.o. Other Clinician: Date of Birth/Sex: Male) Treating ROBSON, MICHAEL Primary Care Physician: Gavin Pound Physician/Extender: G Referring Physician: Judeth Horn in Treatment: 14 Active Problems Location of Pain Severity and Description of Pain Patient Has Paino No Site Locations With Dressing Change: No Pain Management and Medication Current Pain Management: Electronic Signature(s) Signed: 06/12/2016 5:11:17 PM By: Regan Lemming BSN, RN Entered By: Regan Lemming on 06/12/2016 09:24:49 [name] ([id number]) -------------------------------------------------------------------------------- Patient/Caregiver Education Details Patient Name: [name]. Date of Service: 06/12/2016 9:00 AM Medical Record Patient Account Number: [id number] Number: Treating RN: Afful, RN, BSN, Velva Harman 27-Jun-1961 (55 y.o.  Other Clinician: Date of Birth/Gender: Male) Treating ROBSON, [place] Primary Care Physician: Gavin Pound Physician/Extender: G Referring Physician: Judeth Horn in Treatment: 14 Education Assessment Education Provided  To: Patient Education Topics Provided Welcome To [facility]: Methods: Explain/Verbal Responses: State content correctly Wound/Skin Impairment: Methods: Explain/Verbal Responses: State content correctly Electronic Signature(s) Signed: 06/12/2016 5:11:17 PM By: Regan Lemming BSN, RN Entered By: Regan Lemming on 06/12/2016 11:28:07 [name] ([id number]) -------------------------------------------------------------------------------- Wound Assessment Details Patient Name: [name]. Date of Service: 06/12/2016 9:00 AM Medical Record Patient Account Number: [id number] Number: Treating RN: Afful, RN, BSN, Rita August 17, 1960 (55 y.o. Other Clinician: Date of Birth/Sex: Male) Treating ROBSON, [place] Primary Care Physician: Gavin Pound Physician/Extender: G Referring Physician: Gavin Pound Weeks in Treatment: 14 Wound Status Wound Number: 6 Primary Diabetic Wound/Ulcer of the Lower Etiology: Extremity Wound Location: Left Foot - Lateral Wound Status: Open Wounding Event: Gradually Appeared Comorbid Hypertension, Type II Diabetes, Date Acquired: 03/25/2016 History: Neuropathy Weeks Of Treatment: 8 Clustered Wound: No Photos Photo Uploaded By: Regan Lemming on 06/12/2016 17:17:15 Wound Measurements Length: (cm) 0.5 Width: (cm) 0.7 Depth: (cm) 0.4 Area: (cm) 0.275 Volume: (cm) 0.11 % Reduction in Area: 94.2% % Reduction in Volume: 88.3% Epithelialization: Small (1-33%) Tunneling: No Undermining: No Wound Description Classification: Grade 1 Foul Odor Aft Wound Margin: Thickened Exudate Amount: Large Exudate Type: Serosanguineous Exudate Color: red, brown er Cleansing: No Wound Bed Granulation Amount: Large (67-100%) Exposed Structure Granulation Quality: Pink, Pale Fascia Exposed: No Necrotic Amount: Small (1-33%) Fat Layer Exposed: No [name], [name] ([id number]) Necrotic Quality: Adherent Slough Tendon Exposed: No Muscle Exposed:  No Joint Exposed: No Bone Exposed: No Limited to Skin Breakdown Periwound Skin Texture Texture Color No Abnormalities Noted: No No Abnormalities Noted: No Callus: Yes Atrophie Blanche: No Crepitus: No Cyanosis: No Excoriation: No Ecchymosis: No Fluctuance: No Erythema: No Friable: No Hemosiderin Staining: No Induration: No Mottled: No Localized Edema: No Pallor: No Rash: No Rubor: No Scarring: No Temperature / Pain Moisture Temperature: No Abnormality No Abnormalities Noted: No Dry / Scaly: No Maceration: No Moist: Yes Wound Preparation Ulcer Cleansing: Rinsed/Irrigated with Saline Topical Anesthetic Applied: Other: Lidocaine 4%, Treatment Notes Wound #6 (Left, Lateral Foot) 1. Cleansed with: Cleanse wound with antibacterial soap and water 4. Dressing Applied: Prisma Ag 5. Secondary Dressing Applied Gauze and Kerlix/Conform 6. Footwear/Offloading device applied Felt/Foam Other footwear/offloading device applied (specify in notes) Notes TCC Electronic Signature(s) Signed: 06/12/2016 5:11:17 PM By: Regan Lemming BSN, RN Entered By: Regan Lemming on 06/12/2016 09:38:47 [name], [name] ([id number]) [name], [name] ([id number]) -------------------------------------------------------------------------------- Vitals Details Patient Name: [name]. Date of Service: 06/12/2016 9:00 AM Medical Record Patient Account Number: [id number] Number: Treating RN: Afful, RN, BSN, Rita July 05, 1960 (55 y.o. Other Clinician: Date of Birth/Sex: Male) Treating ROBSON, [place] Primary Care Physician: NNODI, ADAKU Physician/Extender: G Referring Physician: Gavin Pound Weeks in Treatment: 14 Vital Signs Time Taken: 09:24 Temperature (F): 98.2 Height (in): 70 Pulse (bpm): 96 Weight (lbs): 260 Respiratory Rate (breaths/min): 17 Body Mass Index (BMI): 37.3 Blood Pressure (mmHg): 157/85 Reference Range: 80 - 120 mg / dl Electronic Signature(s) Signed:  06/12/2016 5:11:17 PM By: Regan Lemming BSN, RN Entered By: Regan Lemming on 06/12/2016 09:24:38

## 2016-06-19 ENCOUNTER — Other Ambulatory Visit: Payer: Self-pay | Admitting: Internal Medicine

## 2016-06-19 ENCOUNTER — Ambulatory Visit
Admission: RE | Admit: 2016-06-19 | Discharge: 2016-06-19 | Disposition: A | Discharge status: Home / Self Care | Payer: Managed Care, Other (non HMO) | Source: Ambulatory Visit | Attending: Internal Medicine | Admitting: Internal Medicine

## 2016-06-19 ENCOUNTER — Encounter: Payer: Managed Care, Other (non HMO) | Admitting: Internal Medicine

## 2016-06-19 DIAGNOSIS — T148XXA Other injury of unspecified body region, initial encounter: Secondary | ICD-10-CM

## 2016-06-19 DIAGNOSIS — X58XXXA Exposure to other specified factors, initial encounter: Secondary | ICD-10-CM | POA: Insufficient documentation

## 2016-06-19 DIAGNOSIS — S91302A Unspecified open wound, left foot, initial encounter: Secondary | ICD-10-CM | POA: Insufficient documentation

## 2016-06-19 DIAGNOSIS — E11621 Type 2 diabetes mellitus with foot ulcer: Secondary | ICD-10-CM | POA: Diagnosis not present

## 2016-06-19 DIAGNOSIS — E119 Type 2 diabetes mellitus without complications: Secondary | ICD-10-CM | POA: Diagnosis not present

## 2016-06-20 NOTE — Progress Notes (Signed)
[name], [name] ([id number]) Visit Report for 06/19/2016 Arrival Information Details Patient Name: [name], [name]. Date of Service: 06/19/2016 12:30 PM Medical Record Patient Account Number: [id number] Number: Treating RN: Baruch Gouty, RN, BSN, Rita June 11, 1961 (55 y.o. Other Clinician: Date of Birth/Sex: Male) Treating ROBSON, [place] Primary Care Physician: NNODI, ADAKU Physician/Extender: G Referring Physician: Judeth Horn in Treatment: 15 Visit Information History Since Last Visit All ordered tests and consults were No Patient Arrived: Ambulatory completed: Arrival Time: 12:45 Added or deleted any medications: No Accompanied By: self Any new allergies or adverse reactions: No Transfer Assistance: None Had a fall or experienced change in No Patient Identification Verified: Yes activities of daily living that may affect Secondary Verification Process Yes risk of falls: Completed: Signs or symptoms of abuse/neglect since No Patient Requires Transmission-Based No last visito Precautions: Hospitalized since last visit: No Patient Has Alerts: Yes Has Dressing in Place as Prescribed: Yes Patient Alerts: DM II Has Footwear/Offloading in Place as Yes Prescribed: Left: Total Contact Cast Pain Present Now: No Electronic Signature(s) Signed: 06/19/2016 5:17:40 PM By: Regan Lemming BSN, RN Entered By: Regan Lemming on 06/19/2016 12:45:39 [name] ([id number]) -------------------------------------------------------------------------------- Encounter Discharge Information Details Patient Name: [name]. Date of Service: 06/19/2016 12:30 PM Medical Record Patient Account Number: [id number] Number: Treating RN: Baruch Gouty, RN, BSN, Rita 1961/03/19 (55 y.o. Other Clinician: Date of Birth/Sex: Male) Treating ROBSON, MICHAEL Primary Care Physician: Gavin Pound Physician/Extender: G Referring Physician: Judeth Horn in Treatment:  15 Encounter Discharge Information Items Discharge Pain Level: 0 Discharge Condition: Stable Ambulatory Status: Ambulatory Discharge Destination: Home Transportation: Private Auto Accompanied By: self Schedule Follow-up Appointment: No Medication Reconciliation completed and provided to Patient/Care No Erasmo Vertz: Provided on Clinical Summary of Care: 06/19/2016 Form Type Recipient Paper Patient HP Electronic Signature(s) Signed: 06/19/2016 4:30:10 PM By: Regan Lemming BSN, RN Previous Signature: 06/19/2016 1:55:53 PM Version By: Ruthine Dose Entered By: Regan Lemming on 06/19/2016 16:30:10 [name] ([id number]) -------------------------------------------------------------------------------- Lower Extremity Assessment Details Patient Name: [name]. Date of Service: 06/19/2016 12:30 PM Medical Record Patient Account Number: [id number] Number: Treating RN: Baruch Gouty, RN, BSN, Rita [birth year] (55 y.o. Other Clinician: Date of Birth/Sex: Male) Treating ROBSON, MICHAEL Primary Care Physician: Gavin Pound Physician/Extender: G Referring Physician: Judeth Horn in Treatment: 15 Vascular Assessment Claudication: Claudication Assessment [Left:None] Pulses: Dorsalis Pedis Palpable: [Left:Yes] Posterior Tibial Extremity colors, hair growth, and conditions: Extremity Color: [Left:Dusky] Hair Growth on Extremity: [Left:Yes] Temperature of Extremity: [Left:Warm] Capillary Refill: [Left:< 3 seconds] Electronic Signature(s) Signed: 06/19/2016 5:17:40 PM By: Regan Lemming BSN, RN Entered By: Regan Lemming on 06/19/2016 12:53:02 [name] ([id number]) -------------------------------------------------------------------------------- Multi Wound Chart Details Patient Name: [name]. Date of Service: 06/19/2016 12:30 PM Medical Record Patient Account Number: [id number] Number: Treating RN: Baruch Gouty, RN, BSN, Rita February 15, 1961 (55 y.o. 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] [N/A:N/A] Comorbid History: [6:Hypertension, Type II Diabetes, Neuropathy] [N/A:N/A] Date Acquired: [6:03/25/2016] [N/A:N/A] Weeks of Treatment: [6:9] [N/A:N/A] Wound Status: [6:Open] [N/A:N/A] Measurements L x W x D 2x5x1 [N/A:N/A] (cm) Area (cm) : [6:7.854] [N/A:N/A] Volume (cm) : [6:7.854] [N/A:N/A] % Reduction  in Area: [6:-66.70%] [N/A:N/A] % Reduction in Volume: -733.80% [N/A:N/A] Classification: [6:Grade 1] [N/A:N/A] Exudate Amount: [6:Large] [N/A:N/A] Exudate Type: [6:Serosanguineous] [N/A:N/A] Exudate Color: [6:red, brown] [N/A:N/A] Wound Margin: [6:Thickened] [N/A:N/A] Granulation Amount: [6:Large (67-100%)] [N/A:N/A] Granulation Quality: [6:Pink, Pale] [N/A:N/A] Necrotic Amount: [6:Small (1-33%)] [N/A:N/A] Exposed Structures: [6:Fascia: No Fat: No Tendon: No Muscle: No] [N/A:N/A] Joint: No Bone: No Limited to Skin Breakdown Epithelialization: Small (1-33%) N/A N/A Debridement: Debridement [id number] N/A N/A [id number]) Pre-procedure 13:15 N/A N/A Verification/Time Out Taken: Pain Control: Lidocaine 4% Topical N/A N/A Solution Tissue Debrided: Necrotic/Eschar, N/A N/A Fibrin/Slough, Exudates, Skin, Subcutaneous Level: Skin/Subcutaneous N/A N/A Tissue Debridement Area (sq 10 N/A N/A cm): Instrument: Blade, Curette, Forceps, N/A N/A Scissors Specimen: Swab N/A N/A Number of Specimens 1 N/A N/A Taken: Bleeding:  Moderate N/A N/A Hemostasis Achieved: Silver Nitrate N/A N/A Procedural Pain: 0 N/A N/A Post Procedural Pain: 0 N/A N/A Debridement Treatment Procedure was tolerated N/A N/A Response: well Post Debridement 2x5x1 N/A N/A Measurements L x W x D (cm) Post Debridement 7.854 N/A N/A Volume: (cm) Periwound Skin Texture: Callus: Yes N/A N/A Edema: No Excoriation: No Induration: No Crepitus: No Fluctuance: No Friable: No Rash: No Scarring: No Periwound Skin Moist: Yes N/A N/A Moisture: Maceration: No Dry/Scaly: No Periwound Skin Color: Atrophie Blanche: No N/A N/A Cyanosis: No Ecchymosis: No LYMON, SEGERSTROM ([id number]) Erythema: No Hemosiderin Staining: No Mottled: No Pallor: No Rubor: No Temperature: No Abnormality N/A N/A Tenderness on No N/A N/A Palpation: Wound Preparation: Ulcer Cleansing: N/A N/A Rinsed/Irrigated with Saline Topical Anesthetic Applied: Other: Lidocaine 4% Procedures Performed: Debridement N/A N/A Treatment Notes Electronic Signature(s) Signed: 06/19/2016 5:40:41 PM By: [name] MD Entered By: [name] on 06/19/2016 14:01:10 [name] ([id number]) -------------------------------------------------------------------------------- [place] Details Patient Name: ZEPPLIN, MISAK. Date of Service: 06/19/2016 12:30 PM Medical Record Patient Account Number: [id number] Number: Treating RN: Baruch Gouty, RN, BSN, Rita April 17, 1961 (55 y.o. Other Clinician: Date of Birth/Sex: Male) Treating ROBSON, [place] Primary Care Physician: Gavin Pound Physician/Extender: G Referring Physician: Judeth Horn in Treatment: 15 Active Inactive Orientation to the Wound Care Program Nursing Diagnoses: Knowledge deficit related to the wound healing center program Goals: Patient/caregiver will verbalize understanding of the [place] Program Date Initiated: 04/16/2016 Goal Status:  Active Interventions: Provide education on orientation to the wound center Notes: Wound/Skin Impairment Nursing Diagnoses: Impaired tissue integrity Knowledge deficit related to ulceration/compromised skin integrity Goals: Patient/caregiver will verbalize understanding of skin care regimen Date Initiated: 04/16/2016 Goal Status: Active Ulcer/skin breakdown will have a volume reduction of 30% by week 4 Date Initiated: 04/16/2016 Goal Status: Active Ulcer/skin breakdown will have a volume reduction of 50% by week 8 Date Initiated: 04/16/2016 Goal Status: Active Ulcer/skin breakdown will have a volume reduction of 80% by week 12 Date Initiated: 04/16/2016 Goal Status: Active Ulcer/skin breakdown will heal within 14 weeks SHAMEEK, CHALAS ([id number]) Date Initiated: 04/16/2016 Goal Status: Active Interventions: Assess patient/caregiver ability to obtain necessary supplies Assess patient/caregiver ability to perform ulcer/skin care regimen upon admission and as needed Assess ulceration(s) every visit Provide education on ulcer and skin care Treatment Activities: Skin care regimen initiated : 03/06/2016 Topical wound management initiated : 03/06/2016 Notes: Electronic Signature(s) Signed: 06/19/2016 5:17:40 PM By: Regan Lemming BSN, RN Entered By: Regan Lemming on 06/19/2016 13:16:41 [name] ([id number]) -------------------------------------------------------------------------------- Pain Assessment Details Patient Name: [name]. Date of Service: 06/19/2016 12:30 PM Medical Record Patient Account Number: [id number] Number: Treating RN: Baruch Gouty, RN, BSN, Rita March 29, 1961 (55 y.o.  Other Clinician: Date of Birth/Sex: Male) Treating ROBSON, MICHAEL Primary Care Physician: Gavin Pound Physician/Extender: G Referring Physician: Judeth Horn in Treatment: 15 Active Problems Location of Pain Severity and Description of Pain Patient Has Paino No Site  Locations Pain Management and Medication Current Pain Management: Electronic Signature(s) Signed: 06/19/2016 5:17:40 PM By: Regan Lemming BSN, RN Entered By: Regan Lemming on 06/19/2016 12:45:45 [name] ([id number]) -------------------------------------------------------------------------------- Patient/Caregiver Education Details Patient Name: [name]. Date of Service: 06/19/2016 12:30 PM Medical Record Patient Account Number: [id number] Number: Treating RN: Baruch Gouty, RN, BSN, Rita 1960-07-08 (55 y.o. Other Clinician: Date of Birth/Gender: Male) Treating ROBSON, [place] Primary Care Physician: Gavin Pound Physician/Extender: G Referring Physician: Judeth Horn in Treatment: 15 Education Assessment Education Provided To: Patient Education Topics Provided Welcome To [facility]: Methods: Explain/Verbal Responses: State content correctly Wound/Skin Impairment: Methods: Explain/Verbal Responses: State content correctly Electronic Signature(s) Signed: 06/19/2016 5:17:40 PM By: Regan Lemming BSN, RN Entered By: Regan Lemming on 06/19/2016 16:30:27 [name] ([id number]) -------------------------------------------------------------------------------- Wound Assessment Details Patient Name: [name]. Date of Service: 06/19/2016 12:30 PM Medical Record Patient Account Number: [id number] Number: Treating RN: Baruch Gouty, RN, BSN, Rita [birth year] (55 y.o. Other Clinician: Date of Birth/Sex: Male) Treating ROBSON, MICHAEL Primary Care Physician: Gavin Pound Physician/Extender: G Referring Physician: Gavin Pound Weeks in Treatment: 15 Wound Status Wound Number: 6 Primary Diabetic Wound/Ulcer of the Lower Etiology: Extremity Wound Location: Left Foot - Lateral Wound Status: Open Wounding Event: Gradually Appeared Comorbid Hypertension, Type II Diabetes, Date Acquired: 03/25/2016 History: Neuropathy Weeks Of Treatment:  9 Clustered Wound: No Photos Photo Uploaded By: Regan Lemming on 06/19/2016 16:45:08 Wound Measurements Length: (cm) 2 Width: (cm) 5 Depth: (cm) 1 Area: (cm) 7.854 Volume: (cm) 7.854 % Reduction in Area: -66.7% % Reduction in Volume: -733.8% Epithelialization: Small (1-33%) Tunneling: No Undermining: No Wound Description Classification: Grade 1 Foul Odor Aft Wound Margin: Thickened Exudate Amount: Large EILAN, MATSUNAGA ([id number]) er Cleansing: No Exudate Type: Serosanguineous Exudate Color: red, brown Wound Bed Granulation Amount: Large (67-100%) Exposed Structure Granulation Quality: Pink, Pale Fascia Exposed: No Necrotic Amount: Small (1-33%) Fat Layer Exposed: No Necrotic Quality: Adherent Slough Tendon Exposed: No Muscle Exposed: No Joint Exposed: No Bone Exposed: No Limited to Skin Breakdown Periwound Skin Texture Texture Color No Abnormalities Noted: No No Abnormalities Noted: No Callus: Yes Atrophie Blanche: No Crepitus: No Cyanosis: No Excoriation: No Ecchymosis: No Fluctuance: No Erythema: No Friable: No Hemosiderin Staining: No Induration: No Mottled: No Localized Edema: No Pallor: No Rash: No Rubor: No Scarring: No Temperature / Pain Moisture Temperature: No Abnormality No Abnormalities Noted: No Dry / Scaly: No Maceration: No Moist: Yes Wound Preparation Ulcer Cleansing: Rinsed/Irrigated with Saline Topical Anesthetic Applied: Other: Lidocaine 4%, Treatment Notes Wound #6 (Left, Lateral Foot) 1. Cleansed with: Clean wound with Normal Saline 4. Dressing Applied: Aquacel Ag 5. Secondary Dressing Applied Gauze and Kerlix/Conform 7. Secured with [employer]) MIDAS, FEIMSTER ([id number]) Signed: 06/19/2016 5:17:40 PM By: Regan Lemming BSN, RN Entered By: Regan Lemming on 06/19/2016 13:42:15 CHANDLOR, JERKINS ([id number]) -------------------------------------------------------------------------------- Vitals  Details Patient Name: KAMAN, FRONDA. Date of Service: 06/19/2016 12:30 PM Medical Record Patient Account Number: [id number] Number: Treating RN: Baruch Gouty, RN, BSN, Rita 11/18/1960 (55 y.o. Other Clinician: Date of Birth/Sex: Male) Treating ROBSON, MICHAEL Primary Care Physician: Gavin Pound Physician/Extender: G Referring Physician: Gavin Pound Weeks in Treatment: 15 Vital Signs Time Taken: 12:45 Temperature (F): 98.1 Height (in): 70 Pulse (bpm): 94  Weight (lbs): 260 Respiratory Rate (breaths/min): 16 Body Mass Index (BMI): 37.3 Blood Pressure (mmHg): 156/85 Reference Range: 80 - 120 mg / dl Electronic Signature(s) Signed: 06/19/2016 5:17:40 PM By: Regan Lemming BSN, RN Entered By: Regan Lemming on 06/19/2016 12:50:26

## 2016-06-20 NOTE — Progress Notes (Addendum)
RYOTARO, ROMANS ([id number]) Visit Report for 06/19/2016 Chief Complaint Document Details Patient Name: [name], [name]. Date of Service: 06/19/2016 12:30 PM Medical Record Patient Account Number: [id number] Number: Treating RN: Baruch Gouty, RN, BSN, Rita 04/11/1961 (55 y.o. Other Clinician: Date of Birth/Sex: Male) Treating Azzan Butler Primary Care Physician/Extender: Garlan Fair Physician: Referring Physician: Judeth Horn in Treatment: 15 Information Obtained from: Patient Chief Complaint 03/06/16; patient returns today after further surgery on the left foot as well as a 6 week course of IV antibiotics Electronic Signature(s) Signed: 06/19/2016 5:40:41 PM By: [name] MD Entered By: [name] on 06/19/2016 14:01:49 [name] ([id number]) -------------------------------------------------------------------------------- Debridement Details Patient Name: [name]. Date of Service: 06/19/2016 12:30 PM Medical Record Patient Account Number: [id number] Number: Treating RN: Baruch Gouty, RN, BSN, Rita 1961/01/17 (55 y.o. Other Clinician: Date of Birth/Sex: Male) Treating Madalyne Husk Primary Care Physician/Extender: Ali Lowe, ADAKU Physician: Referring Physician: Judeth Horn in Treatment: 15 Debridement Performed for Wound #6 Left,Lateral Foot Assessment: Performed By: Physician Ricard Dillon, MD Debridement: Debridement Pre-procedure Yes - 13:15 Verification/Time Out Taken: Start Time: 13:15 Pain Control: Lidocaine 4% Topical Solution Level: Skin/Subcutaneous Tissue Total Area Debrided (L x 2 (cm) x 5 (cm) = 10 (cm) W): Tissue and other Non-Viable, Eschar, Exudate, Fibrin/Slough, Skin, Subcutaneous material debrided: Instrument: Blade, Curette, Forceps, Scissors Specimen: Swab Number of Specimens 1 Taken: Bleeding: Moderate Hemostasis Achieved: Silver Nitrate End Time: 13:25 Procedural Pain: 0 Post  Procedural Pain: 0 Response to Treatment: Procedure was tolerated well Post Debridement Measurements of Total Wound Length: (cm) 2 Width: (cm) 5 Depth: (cm) 1 Volume: (cm) 7.854 Character of Wound/Ulcer Post Requires Further Debridement Debridement: Severity of Tissue Post Debridement: Other severity specified Post Procedure Diagnosis Same as Pre-procedure [name], [name] ([id number]) Electronic Signature(s) Signed: 06/19/2016 5:17:40 PM By: Regan Lemming BSN, RN Signed: 06/19/2016 5:40:41 PM By: [name] MD Entered By: [name] on 06/19/2016 14:01:30 [name] ([id number]) -------------------------------------------------------------------------------- HPI Details Patient Name: [name]. Date of Service: 06/19/2016 12:30 PM Medical Record Patient Account Number: [id number] Number: Treating RN: Baruch Gouty, RN, BSN, Rita [birth year] (55 y.o. Other Clinician: Date of Birth/Sex: Male) Treating Joliana Claflin Primary Care Physician/Extender: Ali Lowe, ADAKU Physician: Referring Physician: Gavin Pound Weeks in Treatment: 15 History of Present Illness HPI Description: Pleasant 55 year old with history of diabetes (Hgb A1c 10.8 in 2014) and peripheral neuropathy. No PVD. L ABI 1.1. Status post right great toe partial amputation years ago. He was at work and on 10/22/2014, was injured by a cart, and suffered an ulceration to his left anterior calf. He says that it subsequently became infected, and he was treated with a course of antibiotics. He was found on initial exam to have an ulceration on the dorsum of his left third toe. He was unaware of this and attributes it to pressure from his steel toed boots. More recently he injured his right anterior calf on a cart. Ambulating normally per his baseline. He has been undergoing regular debridements, applying mupirocin cream, and an Ace wrap for edema control. He returns to clinic for follow-up and is  without complaints. No pain. No fever or chills. No drainage. 10/25/15; this is a 55 year old man who has type II diabetes with diabetic polyneuropathy. He tells me that he fractured his left fifth metatarsal in June 2016 when he presented with swelling. He does not recall a specific injury. His hemoglobin A1c was apparently too high at the time for consideration of  surgery and he was put in some form of offloading. Ultimately he went to surgery in December with an allograft from his calcaneus to this site, plate and screws. He had an x-ray of the foot in March that showed concerns about nonunion. He tells me that in March he had to move and basically moved himself. He was on his foot a lot and then noticed some drainage from an open area. He has been following with his orthopedic surgeon Dr. Doran Durand. He has been applying a felt donut, dry dressing and using his heel healing sandal. 11/01/15; this is a patient I saw last week for the first time. He had a small open wound on the plantar aspect of his left foot at roughly the level of the base of his fifth metatarsal. He had a considerable degree of thickened skin around this wound on the plantar aspect which I thought was from chronic pressure on this area. He tells [place] that he had drainage over the course of the week. No systemic symptoms. 11/08/15; culture last week grew Citrobacter korseri. This should've been sensitive to the Augmentin I gave him. He has seen Dr. Doran Durand who did his initial surgery and according the patient the plan is to give this another month and then the hardware might need to come out of this. This seems like a reasonable plan. I will adjust his antibiotics to ciprofloxacin which probably should continue for at least another 2 weeks. I gave him 10 days worth today 11/22/15 the patient has completed antibiotics. He has an appointment with Dr. Doran Durand this Friday. There is improved dimensions around the wound on the left fifth  metatarsal base 11/29/15; the patient has completed antibiotics last week. Apparently his appointment with Dr. Doran Durand it is not until this Friday. Dimensions are roughly the same. 12/06/15; saw Dr. Doran Durand. No x-ray told the end of the month, next appointment June 30. We have been using Aquacel Ag 12/13/15: No major change this week. Using Aquacel AG [name], [name]. ([id number]) 621/17; arrived this week with maceration around the wound. There was quite a bit of undermining which required surgical debridement. I changed him to [facility] last week, by the patient's admission he was up on this more this week 12/27/15; macerated tissue around the wound is removed with a scalpel and pickups. There is no undermining. Nonviable subcutaneous tissue and skin taken from the superior circumference of the wound is slough from the surface. READMISSION 03/06/16 since I last saw this patient at the end of June, he went for surgery on 01/11/16 by Dr. Doran Durand of orthopedics. He had a left foot irrigation and debridement, removal of hardware and placement of wound VAC. He is also been followed by Dr. Megan Salon of infectious disease and completed a six-week course of IV Rocephin for group B strep and Enterobacter in the bone at the time of surgery. Apparently at the time of surgery the bone looked healthy so I don't think any bone was actually removed. He has been using silver alginate based dressings on the same wound area at the base of the left fifth metatarsal on the left. I note that he is also had arterial studies on 01/08/16, these showed a left ABI of 1.2 to and a right ABI of 1.3. Waveforms were listed as biphasic. He was not felt to have any specific arterial issues. 03/13/16; no real change in the condition of the wound at the left lateral foot at roughly the base of his fifth metatarsal.  Use silver alginate last week. 03/18/16 arrives today with no open area. Being suspicious of the overlying callus I. Some  of this back although I see nothing but covering tissue here/epithelium. There is no surrounding tenderness READMISSION 04/16/16 this is a patient I discharged about a month ago. Initially a surgical wound on the lateral aspect of his left foot which subsequently became infected. The story he is giving today that he went back into his own modified shoe started to notice pain 2 weeks ago he was seen in Dr. Nona Dell office by a physician assistant last Wednesday and according the patient was told that everything looked fine however this is clearly now broken open and he has an open wound in the same spot that we have been dealing with repetitively. Situation is complicated by the fact that he is running short of money on long-term disability. He has not taken his insulin and at least 2 weeks was previously on NovoLog short acting insulin on a sliding scale and TRESIBA 35 units at bedtime. He is no longer able to afford any of his medication he was in the x-ray on 10/15. A plain x-ray showed healed fracture of the left fifth metatarsal bone status post removal of the associated plate and screw fixation hardware. There was no acute appearing osseous abnormality. His blood work showed a white count of 9.2 with an essentially normal differential comprehensive metabolic panel was normal. Previous CT scan of the foot on 01/08/16 showed no osteomyelitis previous vascular workup showed no evidence of significant PAD on 01/13/16 04/23/16; culture I did last week grew enterococcus [ampicillin sensitive] and MRSA. He saw infectious disease yesterday. They stop the clindamycin and ordered an MRI. This is not unreasonable. All the hardware is out of the foot at this point. 04/30/16 at this point in time we are still awaiting the results of the MRI at this point in time. Patient did have an area which appears to be somewhat macerated in the proximal portion of the wound where there is overlying necrotic skin that  is doing nothing more than trapping fluid underneath. He continues to state that he does have some discomfort and again the concern is for the possibility of osteomyelitis hence the reason for the MRI order. We have been using a silver alginate dressing but again I think the reason this with his macerated as it was is that the dressing obscene could not reach the entirety of the wound due to some necrotic skin covering the proximal portion. No bleeding noted at this point in time on initial evaluation. 05/07/16 we receive the results of patient's MRI which shows that he has no evidence of osteomyelitis. This obviously is excellent news. Patient was definitely happy to hear this. He tells me the wound appears to be doing very well at this point in time and is pleased with the progress currently. [name], [name] ([id number]) 05/15/16; as noted the patient did not have osteomyelitis. He has been released by infectious disease and orthopedics. His wound is still open he had a debridement last week but complain when he got home he "bleeding for half a day". He is not had any pain. We have been using silver alginate with Kerlix gauze wrap. 05/28/16; patient arrives today with the wound in much the same condition as last time. He has a small opening on the lateral aspect of his foot however with debridement there is clear undermining medially there is no real evidence of infection here and I  didn't see any point in culturing this. One would have to wonder if this isn't a simple matter of in adequate offloading as he is been using a healing sandal. 06/05/16; the open area is now on the plantar aspect of his foot and not decide. The wound almost appears to have "migrated". This was the term use by our intake nurse. 06/12/16; open area on the plantar aspect of his foot. Base of the wound looks very healthy. This will be his second week in a total contact cast 06/19/16; patient arrived today in a total  contact cast. There was some expectation from our staff and myself that this area would be healed. Unfortunately the area was boggy and with rec pressure a fairly substantial amount of purulent drainage was obtained. Specimen obtained for culture. The patient had no complaints of systemic problems including fever or chills or instability of his diabetes. There was no pain in the foot. Nevertheless a extensive debridement was required Electronic Signature(s) Signed: 06/19/2016 5:40:41 PM By: [name] MD Entered By: [name] on 06/19/2016 14:03:03 [name] ([id number]) -------------------------------------------------------------------------------- Physical Exam Details Patient Name: [name], [name]. Date of Service: 06/19/2016 12:30 PM Medical Record Patient Account Number: [id number] Number: Treating RN: Baruch Gouty, RN, BSN, Rita 02-18-1961 (55 y.o. Other Clinician: Date of Birth/Sex: Male) Treating Jarion Hawthorne Primary Care Physician/Extender: Ali Lowe, ADAKU Physician: Referring Physician: Gavin Pound Weeks in Treatment: 15 Constitutional Patient is hypertensive.. Pulse regular and within target range for patient.Marland Kitchen Respirations regular, non-labored and within target range.. Temperature is normal and within the target range for the patient.. Patient's appearance is neat and clean. Appears in no acute distress. Well nourished and well developed.. Notes On exam; there was no open area on his arrival. However with gentle help patient there was cruelly early a area of his foot that blotted. With gentle pressure copious amounts of purulent drainage came out of a reasonably small wound. Using a #10 blade and pickups the area was fully excised unfortunately which is fairly substantial on the bottom of the foot extending into the lateral aspect of the foot. There is no exposed bone no tenderness no surrounding cellulitis. Electronic Signature(s) Signed:  06/19/2016 5:40:41 PM By: [name] MD Entered By: [name] on 06/19/2016 14:04:24 [name] ([id number]) -------------------------------------------------------------------------------- Physician Orders Details Patient Name: [name], [name]. Date of Service: 06/19/2016 12:30 PM Medical Record Patient Account Number: [id number] Number: Treating RN: Baruch Gouty, RN, BSN, Rita [birth year] (55 y.o. Other Clinician: Date of Birth/Sex: Male) Treating Ariez Neilan Primary Care Physician/Extender: Ali Lowe, ADAKU Physician: Referring Physician: Judeth Horn in Treatment: 15 Verbal / Phone Orders: Yes Clinician: Afful, RN, BSN, Rita Read Back and Verified: Yes Diagnosis Coding Wound Cleansing Wound #6 Left,Lateral Foot o Clean wound with Normal Saline. Anesthetic Wound #6 Left,Lateral Foot o Topical Lidocaine 4% cream applied to wound bed prior to debridement Primary Wound Dressing Wound #6 Left,Lateral Foot o Aquacel Ag Secondary Dressing Wound #6 Left,Lateral Foot o Gauze and Kerlix/Conform Dressing Change Frequency Wound #6 Left,Lateral Foot o Change dressing every day. Follow-up Appointments Wound #6 Left,Lateral Foot o Return Appointment in 1 week. Off-Loading Wound #6 Left,Lateral Foot o Open toe surgical shoe with peg assist. Additional Orders / Instructions Wound #6 Left,Lateral Foot o Increase protein intake. o Activity as tolerated [name], [name]. ([id number]) Medications-please add to medication list. Wound #6 Left,Lateral Foot o P.O. Antibiotics - septra ds 1 tab bid x 7days augmentin 875 1 po bid x7day Laboratory [facility]  o Bacteria identified in Wound by Culture (MICRO) - left lateral foot oooo LOINC Code: [id number] oooo Convenience Name: Wound culture routine Radiology o X-ray, foot - left foot Patient Medications Allergies: No Known Allergies Notifications Medication Indication Start End Bactrim DS wound  infection 06/19/2016 DOSE oral 800 mg-160 mg tablet - tablet oral 1 tablet BID x 7days Electronic Signature(s) Signed: 06/19/2016 5:17:40 PM By: Regan Lemming BSN, RN Signed: 06/19/2016 5:40:41 PM By: [name] MD Entered By: Regan Lemming on 06/19/2016 16:33:54 [name], [name] ([id number]) -------------------------------------------------------------------------------- Prescription 06/19/2016 Patient Name: [name]. Physician: Ricard Dillon MD Date of Birth: [birth year] NPI#: [id number] Sex: [name] DEA#: [id number] Phone #: [phone] License #: [id number] Patient Address: [address] [place], [place] [address] [address], [place], [place] [address] [phone] Allergies No Known Allergies Medication Medication: Route: Strength: Form: Bactrim DS oral 800 mg-160 mg tablet Class: ABSORBABLE SULFONAMIDE ANTIBACTERIAL AGENTS Dose: Frequency / Time: Indication: tablet oral 1 tablet BID x 7days wound infection Number of Refills: Number of Units: 0 Generic Substitution: Start Date: End Date: Administered at Substitution Permitted [id number] Facility: No Note to Pharmacy: Signature(s): Date(s): Electronic Signature(s) [name], [name] ([id number]) Signed: 06/19/2016 5:17:40 PM By: Regan Lemming BSN, RN Signed: 06/19/2016 5:40:41 PM By: [name] MD Entered By: Regan Lemming on 06/19/2016 16:33:55 [name] ([id number]) --------------------------------------------------------------------------------  Problem List Details Patient Name: [name], [name]. Date of Service: 06/19/2016 12:30 PM Medical Record Patient Account Number: [id number] Number: Treating RN: Baruch Gouty, RN, BSN, Rita 1960/12/23 (55 y.o. 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 Date of Service: 06/19/2016 12:30 PM Medical Record Patient Account Number: [id number] Number: Treating RN: Baruch Gouty, RN, BSN, Rita [birth year] (56 y.o. Other Clinician: Date of Birth/Sex: Male) Treating Dynastee Brummell Primary Care Physician/Extender: Garlan Fair Physician: Referring Physician: Judeth Horn in Treatment: 15 Subjective Chief Complaint Information obtained from Patient 03/06/16; patient returns today after further surgery on the left foot as well as a 6 week course of IV antibiotics History of Present Illness (HPI) Pleasant 55 year old with history of diabetes (Hgb A1c 10.8 in 2014) and peripheral neuropathy. No PVD. L ABI 1.1. Status post right great toe partial amputation years ago. He was at work and on 10/22/2014, was injured by a cart, and suffered an ulceration to his left anterior calf. He says that it subsequently became infected, and he was treated with a course of antibiotics. He was found on initial exam to have an ulceration on the dorsum of his left third toe. He was unaware of this and attributes it to pressure from his steel toed boots. More recently he injured his right anterior calf on a cart. Ambulating  normally per his baseline. He has been undergoing regular debridements, applying mupirocin cream, and an Ace wrap for edema control. He returns  to clinic for follow-up and is without complaints. No pain. No fever or chills. No drainage. 10/25/15; this is a 55 year old man who has type II diabetes with diabetic polyneuropathy. He tells me that he fractured his left fifth metatarsal in June 2016 when he presented with swelling. He does not recall a specific injury. His hemoglobin A1c was apparently too high at the time for consideration of surgery and he was put in some form of offloading. Ultimately he went to surgery in December with an allograft from his calcaneus to this site, plate and screws. He had an x-ray of the foot in March that showed concerns about nonunion. He tells me that in March he had to move and basically moved himself. He was on his foot a lot and then noticed some drainage from an open area. He has been following with his orthopedic surgeon Dr. Doran Durand. He has been applying a felt donut, dry dressing and using his heel healing sandal. 11/01/15; this is a patient I saw last week for the first time. He had a small open wound on the plantar aspect of his left foot at roughly the level of the base of his fifth metatarsal. He had a considerable degree of thickened skin around this wound on the plantar aspect which I thought was from chronic pressure on this area. He tells [place] that he had drainage over the course of the week. No systemic symptoms. 11/08/15; culture last week grew Citrobacter korseri. This should've been sensitive to the Augmentin I gave him. He has seen Dr. Doran Durand who did his initial surgery and according the patient the plan is to give this another month and then the hardware might need to come out of this. This seems like a reasonable plan. I will adjust his antibiotics to ciprofloxacin which probably should continue for at least another 2 weeks. I gave him 10 days  worth today 11/22/15 the patient has completed antibiotics. He has an appointment with Dr. Doran Durand this Friday. There is CATHAL, REITENBACH ([id number]) improved dimensions around the wound on the left fifth metatarsal base 11/29/15; the patient has completed antibiotics last week. Apparently his appointment with Dr. Doran Durand it is not until this Friday. Dimensions are roughly the same. 12/06/15; saw Dr. Doran Durand. No x-ray told the end of the month, next appointment June 30. We have been using Aquacel Ag 12/13/15: No major change this week. Using Aquacel AG 621/17; arrived this week with maceration around the wound. There was quite a bit of undermining which required surgical debridement. I changed him to [facility] last week, by the patient's admission he was up on this more this week 12/27/15; macerated tissue around the wound is removed with a scalpel and pickups. There is no undermining. Nonviable subcutaneous tissue and skin taken from the superior circumference of the wound is slough from the surface. READMISSION 03/06/16 since I last saw this patient at the end of June, he went for surgery on 01/11/16 by Dr. Doran Durand of orthopedics. He had a left foot irrigation and debridement, removal of hardware and placement of wound VAC. He is also been followed by Dr. Megan Salon of infectious disease and completed a six-week course of IV Rocephin for group B strep and Enterobacter in the bone at the time of surgery. Apparently at the time of surgery the bone looked healthy so I don't think any bone was actually removed. He has been using silver alginate based dressings on the same wound area at the base of the left  fifth metatarsal on the left. I note that he is also had arterial studies on 01/08/16, these showed a left ABI of 1.2 to and a right ABI of 1.3. Waveforms were listed as biphasic. He was not felt to have any specific arterial issues. 03/13/16; no real change in the condition of the wound at the left  lateral foot at roughly the base of his fifth metatarsal. Use silver alginate last week. 03/18/16 arrives today with no open area. Being suspicious of the overlying callus I. Some of this back although I see nothing but covering tissue here/epithelium. There is no surrounding tenderness READMISSION 04/16/16 this is a patient I discharged about a month ago. Initially a surgical wound on the lateral aspect of his left foot which subsequently became infected. The story he is giving today that he went back into his own modified shoe started to notice pain 2 weeks ago he was seen in Dr. Nona Dell office by a physician assistant last Wednesday and according the patient was told that everything looked fine however this is clearly now broken open and he has an open wound in the same spot that we have been dealing with repetitively. Situation is complicated by the fact that he is running short of money on long-term disability. He has not taken his insulin and at least 2 weeks was previously on NovoLog short acting insulin on a sliding scale and TRESIBA 35 units at bedtime. He is no longer able to afford any of his medication he was in the x-ray on 10/15. A plain x-ray showed healed fracture of the left fifth metatarsal bone status post removal of the associated plate and screw fixation hardware. There was no acute appearing osseous abnormality. His blood work showed a white count of 9.2 with an essentially normal differential comprehensive metabolic panel was normal. Previous CT scan of the foot on 01/08/16 showed no osteomyelitis previous vascular workup showed no evidence of significant PAD on 01/13/16 04/23/16; culture I did last week grew enterococcus [ampicillin sensitive] and MRSA. He saw infectious disease yesterday. They stop the clindamycin and ordered an MRI. This is not unreasonable. All the hardware is out of the foot at this point. 04/30/16 at this point in time we are still awaiting the results  of the MRI at this point in time. Patient did have an area which appears to be somewhat macerated in the proximal portion of the wound where there is overlying necrotic skin that is doing nothing more than trapping fluid underneath. He continues to state that he does have some discomfort and again the concern is for the possibility of osteomyelitis hence the reason [name], [name]. ([id number]) for the MRI order. We have been using a silver alginate dressing but again I think the reason this with his macerated as it was is that the dressing obscene could not reach the entirety of the wound due to some necrotic skin covering the proximal portion. No bleeding noted at this point in time on initial evaluation. 05/07/16 we receive the results of patient's MRI which shows that he has no evidence of osteomyelitis. This obviously is excellent news. Patient was definitely happy to hear this. He tells me the wound appears to be doing very well at this point in time and is pleased with the progress currently. 05/15/16; as noted the patient did not have osteomyelitis. He has been released by infectious disease and orthopedics. His wound is still open he had a debridement last week but complain when he got  home he "bleeding for half a day". He is not had any pain. We have been using silver alginate with Kerlix gauze wrap. 05/28/16; patient arrives today with the wound in much the same condition as last time. He has a small opening on the lateral aspect of his foot however with debridement there is clear undermining medially there is no real evidence of infection here and I didn't see any point in culturing this. One would have to wonder if this isn't a simple matter of in adequate offloading as he is been using a healing sandal. 06/05/16; the open area is now on the plantar aspect of his foot and not decide. The wound almost appears to have "migrated". This was the term use by our intake nurse. 06/12/16; open  area on the plantar aspect of his foot. Base of the wound looks very healthy. This will be his second week in a total contact cast 06/19/16; patient arrived today in a total contact cast. There was some expectation from our staff and myself that this area would be healed. Unfortunately the area was boggy and with rec pressure a fairly substantial amount of purulent drainage was obtained. Specimen obtained for culture. The patient had no complaints of systemic problems including fever or chills or instability of his diabetes. There was no pain in the foot. Nevertheless a extensive debridement was required Objective Constitutional Patient is hypertensive.. Pulse regular and within target range for patient.Marland Kitchen Respirations regular, non-labored and within target range.. Temperature is normal and within the target range for the patient.. Patient's appearance is neat and clean. Appears in no acute distress. Well nourished and well developed.. Vitals Time Taken: 12:45 PM, Height: 70 in, Weight: 260 lbs, BMI: 37.3, Temperature: 98.1 F, Pulse: 94 bpm, Respiratory Rate: 16 breaths/min, Blood Pressure: 156/85 mmHg. General Notes: On exam; there was no open area on his arrival. However with gentle help patient there was cruelly early a area of his foot that blotted. With gentle pressure copious amounts of purulent drainage came out of a reasonably small wound. Using a #10 blade and pickups the area was fully excised unfortunately which is fairly substantial on the bottom of the foot extending into the lateral aspect of the foot. There is no exposed bone no tenderness no surrounding cellulitis. Integumentary (Hair, Skin) Wound #6 status is Open. Original cause of wound was Gradually Appeared. The wound is located on the Left,Lateral Foot. The wound measures 2cm length x 5cm width x 1cm depth; 7.854cm^2 area and 7.854cm^3 volume. The wound is limited to skin breakdown. There is no tunneling or undermining  noted. [name], [name] ([id number]) There is a large amount of serosanguineous drainage noted. The wound margin is thickened. There is large (67-100%) pink, pale granulation within the wound bed. There is a small (1-33%) amount of necrotic tissue within the wound bed including Adherent Slough. The periwound skin appearance exhibited: Callus, Moist. The periwound skin appearance did not exhibit: Crepitus, Excoriation, Fluctuance, Friable, Induration, Localized Edema, Rash, Scarring, Dry/Scaly, Maceration, Atrophie Blanche, Cyanosis, Ecchymosis, Hemosiderin Staining, Mottled, Pallor, Rubor, Erythema. Periwound temperature was noted as No Abnormality. 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 There was a Skin/Subcutaneous Tissue Debridement [id number]) debridement with total area of 10 sq cm performed by Conda Wannamaker, Memory Argue,  MD. with the following instrument(s): Blade, Curette, Forceps, and Scissors to remove Non-Viable tissue/material including Exudate, Fibrin/Slough, Eschar, Skin, and Subcutaneous after achieving pain control using Lidocaine 4% Topical Solution. 1 Specimen was taken by a Swab and sent to the lab per facility protocol.A time out was conducted at 13:15, prior to the start of the procedure. A Moderate amount of bleeding was controlled with Silver Nitrate. The procedure was tolerated well with a pain level of 0 throughout and a pain level of 0 following the procedure. Post Debridement Measurements: 2cm length x 5cm width x 1cm depth; 7.854cm^3 volume. Character of Wound/Ulcer Post Debridement requires further debridement. Severity of Tissue Post Debridement is: Other severity specified. Post procedure Diagnosis Wound #6: Same as  Pre-Procedure Plan Wound Cleansing: Wound #6 Left,Lateral Foot: RAQUON, HARSH. ([id number]) Clean wound with Normal Saline. Anesthetic: Wound #6 Left,Lateral Foot: Topical Lidocaine 4% cream applied to wound bed prior to debridement Primary Wound Dressing: Wound #6 Left,Lateral Foot: Aquacel Ag Secondary Dressing: Wound #6 Left,Lateral Foot: Gauze and Kerlix/Conform Dressing Change Frequency: Wound #6 Left,Lateral Foot: Change dressing every day. Follow-up Appointments: Wound #6 Left,Lateral Foot: Return Appointment in 1 week. Off-Loading: Wound #6 Left,Lateral Foot: Open toe surgical shoe with peg assist. Additional Orders / Instructions: Wound #6 Left,Lateral Foot: Increase protein intake. Activity as tolerated Medications-please add to medication list.: Wound #6 Left,Lateral Foot: P.O. Antibiotics - septra ds 1 tab bid x 7days augmentin 875 1 po bid x7day Laboratory ordered were: Wound culture routine - left lateral foot Radiology ordered were: X-ray, foot - left foot The following medication(s) was prescribed: Bactrim DS oral 800 mg-160 mg tablet tablet oral 1 tablet BID x 7days for wound infection starting 06/19/2016 #1 the area was fully excised. Base of this does not look to unhealthy there was no surrounding infection. #2 dressing change to silver alginate #3 based on his previous culture of this area and October which showed ampicillin sensitive enterococcus and a fairly drug resistant MRSA [Doxy and Clinda resistant], I put him back on Augmentin and Septra. Based on culture results I might consider a prolonged acting and the MRSA drug. JASIR, URBANOWICZ ([id number]) #4 given the extent of this infection albeit fairly localized, I did not think it safe to put the cast back on this Electronic Signature(s) Signed: 06/21/2016 8:41:19 AM By: Gretta Cool RN, BSN, Kim RN, BSN Signed: 06/25/2016 3:32:00 PM By: [name] MD Previous Signature: 06/19/2016 5:40:41 PM  Version By: [name] MD Entered By: Gretta Cool RN, BSN, Kim on 06/21/2016 08:40:24 [name] ([id number]) -------------------------------------------------------------------------------- [facility] Details Patient Name: SAMPSON, [name]. Date of Service: 06/19/2016 Medical Record Patient Account Number: [id number] Number: Treating RN: Baruch Gouty, RN, BSN, Rita 08/22/1960 (55 y.o. Other Clinician: Date of Birth/Sex: Male) Treating Amalya Salmons, Johnson Primary Care Physician: Gavin Pound Physician/Extender: G Referring Physician: Judeth Horn in Treatment: 15 Diagnosis Coding ICD-10 Codes Code Description E11.621 Type 2 diabetes mellitus with foot ulcer L97.523 Non-pressure chronic ulcer of other part of left foot with necrosis of muscle Facility Procedures CPT4 Code Description: [id number] - DEB SUBQ TISSUE 20 SQ CM/< ICD-10 Description Diagnosis E11.621 Type 2 diabetes mellitus with foot ulcer L97.523 Non-pressure chronic ulcer of other part of left foot Modifier: with necrosi Quantity: 1 s of muscle Physician Procedures CPT4 Code Description: [id number] - WC PHYS SUBQ TISS 20 SQ CM ICD-10 Description Diagnosis E11.621 Type 2 diabetes mellitus with foot ulcer L97.523 Non-pressure chronic ulcer of other part of left foot Modifier: with  necrosis Quantity: 1 of muscle Electronic Signature(s) Signed: 06/19/2016 5:40:41 PM By: [name] MD Entered By: [name] on 06/19/2016 14:07:35

## 2016-06-22 LAB — AEROBIC CULTURE  (SUPERFICIAL SPECIMEN): GRAM STAIN: NONE SEEN

## 2016-06-22 LAB — AEROBIC CULTURE W GRAM STAIN (SUPERFICIAL SPECIMEN)

## 2016-06-26 ENCOUNTER — Encounter: Payer: Managed Care, Other (non HMO) | Admitting: Internal Medicine

## 2016-06-26 DIAGNOSIS — E11621 Type 2 diabetes mellitus with foot ulcer: Secondary | ICD-10-CM | POA: Diagnosis not present

## 2016-06-27 NOTE — Progress Notes (Signed)
[name], [name] ([id number]) Visit Report for 06/26/2016 Arrival Information Details Patient Name: [name], [name]. Date of Service: 06/26/2016 1:15 PM Medical Record Patient Account Number: [id number] Number: Treating RN: Baruch Gouty, RN, BSN, Rita Aug 17, 1960 (55 y.o. Other Clinician: Date of Birth/Sex: Male) Treating ROBSON, [place] Primary Care Physician: NNODI, ADAKU Physician/Extender: G Referring Physician: Judeth Horn in Treatment: 16 Visit Information History Since Last Visit All ordered tests and consults were completed: No Patient Arrived: Ambulatory Added or deleted any medications: No Arrival Time: 13:34 Any new allergies or adverse reactions: No Accompanied By: grd son Had a fall or experienced change in No Transfer Assistance: None activities of daily living that may affect Patient Identification Verified: Yes risk of falls: Secondary Verification Process Yes Signs or symptoms of abuse/neglect since last No Completed: visito Patient Requires Transmission-Based No Hospitalized since last visit: No Precautions: Has Dressing in Place as Prescribed: Yes Patient Has Alerts: Yes Pain Present Now: No Patient Alerts: DM II Electronic Signature(s) Signed: 06/26/2016 4:25:56 PM By: Regan Lemming BSN, RN Entered By: Regan Lemming on 06/26/2016 13:34:48 [name] ([id number]) -------------------------------------------------------------------------------- Encounter Discharge Information Details Patient Name: [name]. Date of Service: 06/26/2016 1:15 PM Medical Record Patient Account Number: [id number] Number: Treating RN: Baruch Gouty, RN, BSN, Rita 02-25-1961 (55 y.o. Other Clinician: Date of Birth/Sex: Male) Treating ROBSON, MICHAEL Primary Care Physician: Gavin Pound Physician/Extender: G Referring Physician: Judeth Horn in Treatment: 16 Encounter Discharge Information Items Discharge Pain Level: 0 Discharge Condition:  Stable Ambulatory Status: Ambulatory Discharge Destination: Home Transportation: Private Auto Accompanied By: grd son Schedule Follow-up Appointment: No Medication Reconciliation completed and provided to Patient/Care No Meghana Tullo: Provided on Clinical Summary of Care: 06/26/2016 Form Type Recipient Paper Patient HP Electronic Signature(s) Signed: 06/26/2016 4:25:56 PM By: Regan Lemming BSN, RN Previous Signature: 06/26/2016 1:59:29 PM Version By: Ruthine Dose Entered By: Regan Lemming on 06/26/2016 14:01:18 [name] ([id number]) -------------------------------------------------------------------------------- Lower Extremity Assessment Details Patient Name: [name]. Date of Service: 06/26/2016 1:15 PM Medical Record Patient Account Number: [id number] Number: Treating RN: Baruch Gouty, RN, BSN, Rita December 08, 1960 (55 y.o. Other Clinician: Date of Birth/Sex: Male) Treating ROBSON, MICHAEL Primary Care Physician: Gavin Pound Physician/Extender: G Referring Physician: Gavin Pound Weeks in Treatment: 16 Vascular Assessment Pulses: Dorsalis Pedis Palpable: [Left:Yes] Posterior Tibial Extremity colors, hair growth, and conditions: Extremity Color: [Left:Dusky] Hair Growth on Extremity: [Left:Yes] Temperature of Extremity: [Left:Warm] Capillary Refill: [Left:< 3 seconds] Toe Nail Assessment Left: Right: Thick: Yes Discolored: Yes Deformed: No Improper Length and Hygiene: No Electronic Signature(s) Signed: 06/26/2016 4:25:56 PM By: Regan Lemming BSN, RN Entered By: Regan Lemming on 06/26/2016 13:37:50 [name] ([id number]) -------------------------------------------------------------------------------- Multi Wound Chart Details Patient Name: [name]. Date of Service: 06/26/2016 1:15 PM Medical Record Patient Account Number: [id number] Number: Treating RN: Baruch Gouty, RN, BSN, Rita [birth year] (55 y.o. 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] [N/A:N/A] Comorbid History: [6:Hypertension, Type II Diabetes, Neuropathy] [N/A:N/A] Date Acquired: [6:03/25/2016] [N/A:N/A] Weeks of Treatment: [6:10] [N/A:N/A] Wound Status: [6:Open] [N/A:N/A] Measurements L x W x D 2x2x0.3 [N/A:N/A] (cm) Area (cm) : [6:3.142] [N/A:N/A] Volume (cm) : [6:0.942] [  N/A:N/A] % Reduction in Area: [6:33.30%] [N/A:N/A] % Reduction in Volume: 0.00% [N/A:N/A] Classification: [6:Grade 1] [N/A:N/A] Exudate Amount: [6:Large] [N/A:N/A] Exudate Type: [6:Serosanguineous] [N/A:N/A] Exudate Color: [6:red, brown] [N/A:N/A] Wound Margin: [6:Thickened] [N/A:N/A] Granulation Amount: [6:Large (67-100%)] [N/A:N/A] Granulation Quality: [6:Pink, Pale] [N/A:N/A] Necrotic Amount: [6:Small (1-33%)] [N/A:N/A] Exposed Structures: [6:Fascia: No Fat: No Tendon: No Muscle: No] [N/A:N/A] Joint: No Bone: No Limited to Skin Breakdown Epithelialization: Small (1-33%) N/A N/A Debridement: Debridement [id number] N/A N/A [id number]) Pre-procedure 13:50 N/A N/A Verification/Time Out Taken: Pain Control: Lidocaine 4% Topical N/A N/A Solution Tissue Debrided: Fibrin/Slough, N/A N/A Subcutaneous Level: Skin/Subcutaneous N/A N/A Tissue Debridement Area (sq 4 N/A N/A cm): Instrument: Curette N/A N/A Bleeding: Minimum N/A N/A Hemostasis Achieved: Pressure N/A N/A Procedural Pain: 0 N/A N/A Post Procedural Pain: 0 N/A N/A Debridement Treatment Procedure  was tolerated N/A N/A Response: well Post Debridement 2x2x0.3 N/A N/A Measurements L x W x D (cm) Post Debridement 0.942 N/A N/A Volume: (cm) Periwound Skin Texture: Callus: Yes N/A N/A Edema: No Excoriation: No Induration: No Crepitus: No Fluctuance: No Friable: No Rash: No Scarring: No Periwound Skin Moist: Yes N/A N/A Moisture: Maceration: No Dry/Scaly: No Periwound Skin Color: Atrophie Blanche: No N/A N/A Cyanosis: No Ecchymosis: No Erythema: No Hemosiderin Staining: No Mottled: No Pallor: No Rubor: No JAYREN, HEWETT. ([id number]) Temperature: No Abnormality N/A N/A Tenderness on No N/A N/A Palpation: Wound Preparation: Ulcer Cleansing: N/A N/A Rinsed/Irrigated with Saline Topical Anesthetic Applied: Other: Lidocaine 4% Procedures Performed: Debridement N/A N/A Treatment Notes Wound #6 (Left, Lateral Foot) 1. Cleansed with: Clean wound with Normal Saline 4. Dressing Applied: Aquacel Ag 5. Secondary Dressing Applied Guaze, ABD and kerlix/Conform 6. Footwear/Offloading device applied Surgical shoe 7. Secured with Recruitment consultant) Signed: 06/26/2016 5:22:07 PM By: [name] MD Entered By: [name] on 06/26/2016 14:07:06 [name] ([id number]) -------------------------------------------------------------------------------- [place] Details Patient Name: HYMAN, BARRENO. Date of Service: 06/26/2016 1:15 PM Medical Record Patient Account Number: [id number] Number: Treating RN: Baruch Gouty, RN, BSN, Rita 1960/12/04 (55 y.o. Other Clinician: Date of Birth/Sex: Male) Treating ROBSON, [place] Primary Care Physician: Gavin Pound Physician/Extender: G Referring Physician: Judeth Horn in Treatment: 16 Active Inactive Orientation to the Wound Care Program Nursing Diagnoses: Knowledge deficit related to the wound healing center program Goals: Patient/caregiver will verbalize understanding  of the [facility] Program Date Initiated: 04/16/2016 Goal Status: Active Interventions: Provide education on orientation to the wound center Notes: Wound/Skin Impairment Nursing Diagnoses: Impaired tissue integrity Knowledge deficit related to ulceration/compromised skin integrity Goals: Patient/caregiver will verbalize understanding of skin care regimen Date Initiated: 04/16/2016 Goal Status: Active Ulcer/skin breakdown will have a volume reduction of 30% by week 4 Date Initiated: 04/16/2016 Goal Status: Active Ulcer/skin breakdown will have a volume reduction of 50% by week 8 Date Initiated: 04/16/2016 Goal Status: Active Ulcer/skin breakdown will have a volume reduction of 80% by week 12 Date Initiated: 04/16/2016 Goal Status: Active Ulcer/skin breakdown will heal within 14 weeks NATHANE, SABALLOS ([id number]) Date Initiated: 04/16/2016 Goal Status: Active Interventions: Assess patient/caregiver ability to obtain necessary supplies Assess patient/caregiver ability to perform ulcer/skin care regimen upon admission and as needed Assess ulceration(s) every visit Provide education on ulcer and skin care Treatment Activities: Skin care regimen initiated : 03/06/2016 Topical wound management initiated : 03/06/2016 Notes: Electronic Signature(s) Signed: 06/26/2016 4:25:56 PM By: Regan Lemming BSN, RN Entered By: Regan Lemming on 06/26/2016 13:42:47 [name] ([id number]) -------------------------------------------------------------------------------- Pain Assessment Details Patient Name: [name]. Date of  Service: 06/26/2016 1:15 PM Medical Record Patient Account Number: [id number] Number: Treating RN: Baruch Gouty, RN, BSN, Rita [birth year] (55 y.o. Other Clinician: Date of Birth/Sex: Male) Treating ROBSON, MICHAEL Primary Care Physician: Gavin Pound Physician/Extender: G Referring Physician: Judeth Horn in Treatment: 16 Active  Problems Location of Pain Severity and Description of Pain Patient Has Paino No Site Locations With Dressing Change: No Pain Management and Medication Current Pain Management: Electronic Signature(s) Signed: 06/26/2016 4:25:56 PM By: Regan Lemming BSN, RN Entered By: Regan Lemming on 06/26/2016 13:34:54 [name] ([id number]) -------------------------------------------------------------------------------- Patient/Caregiver Education Details Patient Name: [name]. Date of Service: 06/26/2016 1:15 PM Medical Record Patient Account Number: [id number] Number: Treating RN: Baruch Gouty, RN, BSN, Rita [birth year] (55 y.o. Other Clinician: Date of Birth/Gender: Male) Treating ROBSON, [place] Primary Care Physician: Gavin Pound Physician/Extender: G Referring Physician: Judeth Horn in Treatment: 16 Education Assessment Education Provided To: Patient Education Topics Provided Welcome To [facility]: Methods: Explain/Verbal Responses: State content correctly Wound/Skin Impairment: Methods: Explain/Verbal Responses: State content correctly Electronic Signature(s) Signed: 06/26/2016 4:25:56 PM By: Regan Lemming BSN, RN Entered By: Regan Lemming on 06/26/2016 14:01:31 [name] ([id number]) -------------------------------------------------------------------------------- Wound Assessment Details Patient Name: [name]. Date of Service: 06/26/2016 1:15 PM Medical Record Patient Account Number: [id number] Number: Treating RN: Baruch Gouty, RN, BSN, Rita [birth year] (55 y.o. Other Clinician: Date of Birth/Sex: Male) Treating ROBSON, [place] Primary Care Physician: Gavin Pound Physician/Extender: G Referring Physician: Gavin Pound Weeks in Treatment: 16 Wound Status Wound Number: 6 Primary Diabetic Wound/Ulcer of the Lower Etiology: Extremity Wound Location: Left Foot - Lateral Wound Status: Open Wounding Event: Gradually  Appeared Comorbid Hypertension, Type II Diabetes, Date Acquired: 03/25/2016 History: Neuropathy Weeks Of Treatment: 10 Clustered Wound: No Photos Photo Uploaded By: Regan Lemming on 06/26/2016 15:53:39 Wound Measurements Length: (cm) 2 Width: (cm) 2 Depth: (cm) 0.3 Area: (cm) 3.142 Volume: (cm) 0.942 % Reduction in Area: 33.3% % Reduction in Volume: 0% Epithelialization: Small (1-33%) Tunneling: No Undermining: No Wound Description Classification: Grade 1 Foul Odor Aft Wound Margin: Thickened Exudate Amount: Large Exudate Type: Serosanguineous Exudate Color: red, brown er Cleansing: No Wound Bed Granulation Amount: Large (67-100%) Exposed Structure Granulation Quality: Pink, Pale Fascia Exposed: No Necrotic Amount: Small (1-33%) Fat Layer Exposed: No TONEY, TOW ([id number]) Necrotic Quality: Adherent Slough Tendon Exposed: No Muscle Exposed: No Joint Exposed: No Bone Exposed: No Limited to Skin Breakdown Periwound Skin Texture Texture Color No Abnormalities Noted: No No Abnormalities Noted: No Callus: Yes Atrophie Blanche: No Crepitus: No Cyanosis: No Excoriation: No Ecchymosis: No Fluctuance: No Erythema: No Friable: No Hemosiderin Staining: No Induration: No Mottled: No Localized Edema: No Pallor: No Rash: No Rubor: No Scarring: No Temperature / Pain Moisture Temperature: No Abnormality No Abnormalities Noted: No Dry / Scaly: No Maceration: No Moist: Yes Wound Preparation Ulcer Cleansing: Rinsed/Irrigated with Saline Topical Anesthetic Applied: Other: Lidocaine 4%, Treatment Notes Wound #6 (Left, Lateral Foot) 1. Cleansed with: Clean wound with Normal Saline 4. Dressing Applied: Aquacel Ag 5. Secondary Dressing Applied Guaze, ABD and kerlix/Conform 6. Footwear/Offloading device applied Surgical shoe 7. Secured with Recruitment consultant) Signed: 06/26/2016 4:25:56 PM By: Regan Lemming BSN, RN Entered By: Regan Lemming on  06/26/2016 13:42:27 [name] ([id number]) -------------------------------------------------------------------------------- Vitals Details Patient Name: [name]. Date of Service: 06/26/2016 1:15 PM Medical Record Patient Account Number: [id number] Number: Treating RN: Baruch Gouty, RN, BSN, Rita [birth year] (55 y.o. Other Clinician: Date of Birth/Sex: Male) Treating ROBSON, MICHAEL Primary  Care Physician: Gavin Pound Physician/Extender: G Referring Physician: Judeth Horn in Treatment: 16 Vital Signs Time Taken: 13:34 Temperature (F): 98.1 Height (in): 70 Pulse (bpm): 100 Weight (lbs): 260 Respiratory Rate (breaths/min): 16 Body Mass Index (BMI): 37.3 Blood Pressure (mmHg): 155/78 Reference Range: 80 - 120 mg / dl Electronic Signature(s) Signed: 06/26/2016 4:25:56 PM By: Regan Lemming BSN, RN Entered By: Regan Lemming on 06/26/2016 13:37:26

## 2016-06-27 NOTE — Progress Notes (Signed)
KOLIN, DUBS ([id number]) Visit Report for 06/26/2016 Chief Complaint Document Details Patient Name: [name], [name]. Date of Service: 06/26/2016 1:15 PM Medical Record Patient Account Number: [id number] Number: Treating RN: Baruch Gouty, RN, BSN, Rita 1961-07-01 (55 y.o. Other Clinician: Date of Birth/Sex: Male) Treating Leeann Bady Primary Care Physician/Extender: Garlan Fair Physician: Referring Physician: Judeth Horn in Treatment: 16 Information Obtained from: Patient Chief Complaint 03/06/16; patient returns today after further surgery on the left foot as well as a 6 week course of IV antibiotics Electronic Signature(s) Signed: 06/26/2016 5:22:07 PM By: [name] MD Entered By: [name] on 06/26/2016 14:07:29 [name] ([id number]) -------------------------------------------------------------------------------- Debridement Details Patient Name: [name]. Date of Service: 06/26/2016 1:15 PM Medical Record Patient Account Number: [id number] Number: Treating RN: Baruch Gouty, RN, BSN, Rita 04-16-1961 (55 y.o. Other Clinician: Date of Birth/Sex: Male) Treating Myan Suit Primary Care Physician/Extender: Ali Lowe, ADAKU Physician: Referring Physician: Judeth Horn in Treatment: 16 Debridement Performed for Wound #6 Left,Lateral Foot Assessment: Performed By: Physician Ricard Dillon, MD Debridement: Debridement Pre-procedure Yes - 13:50 Verification/Time Out Taken: Start Time: 13:50 Pain Control: Lidocaine 4% Topical Solution Level: Skin/Subcutaneous Tissue Total Area Debrided (L x 2 (cm) x 2 (cm) = 4 (cm) W): Tissue and other Non-Viable, Fibrin/Slough, Subcutaneous material debrided: Instrument: Curette Bleeding: Minimum Hemostasis Achieved: Pressure End Time: 13:55 Procedural Pain: 0 Post Procedural Pain: 0 Response to Treatment: Procedure was tolerated well Post Debridement Measurements of  Total Wound Length: (cm) 2 Width: (cm) 2 Depth: (cm) 0.3 Volume: (cm) 0.942 Character of Wound/Ulcer Post Requires Further Debridement Debridement: Severity of Tissue Post Debridement: Fat layer exposed Post Procedure Diagnosis Same as Pre-procedure Electronic Signature(s) Signed: 06/26/2016 4:25:56 PM By: Regan Lemming BSN, RN [name], [name] ([id number]) Signed: 06/26/2016 5:22:07 PM By: [name] MD Entered By: [name] on 06/26/2016 14:07:19 [name] ([id number]) -------------------------------------------------------------------------------- HPI Details Patient Name: [name], [name]. Date of Service: 06/26/2016 1:15 PM Medical Record Patient Account Number: [id number] Number: Treating RN: Baruch Gouty, RN, BSN, Rita Apr 04, 1961 (56 y.o. Other Clinician: Date of Birth/Sex: Male) Treating Locke Barrell Primary Care Physician/Extender: Ali Lowe, ADAKU Physician: Referring Physician: Gavin Pound Weeks in Treatment: 16 History of Present Illness HPI Description: Pleasant 55 year old with history of diabetes (Hgb A1c 10.8 in 2014) and peripheral neuropathy. No PVD. L ABI 1.1. Status post right great toe partial amputation years ago. He was at work and on 10/22/2014, was injured by a cart, and suffered an ulceration to his left anterior calf. He says that it subsequently became infected, and he was treated with a course of antibiotics. He was found on initial exam to have an ulceration on the dorsum of his left third toe. He was unaware of this and attributes it to pressure from his steel toed boots. More recently he injured his right anterior calf on a cart. Ambulating normally per his baseline. He has been undergoing regular debridements, applying mupirocin cream, and an Ace wrap for edema control. He returns to clinic for follow-up and is without complaints. No pain. No fever or chills. No drainage. 10/25/15; this is a 55 year old man who has type II  diabetes with diabetic polyneuropathy. He tells me that he fractured his left fifth metatarsal in June 2016 when he presented with swelling. He does not recall a specific injury. His hemoglobin A1c was apparently too high at the time for consideration of surgery and he was put in some form of offloading. Ultimately he went to  surgery in December with an allograft from his calcaneus to this site, plate and screws. He had an x-ray of the foot in March that showed concerns about nonunion. He tells me that in March he had to move and basically moved himself. He was on his foot a lot and then noticed some drainage from an open area. He has been following with his orthopedic surgeon Dr. Doran Durand. He has been applying a felt donut, dry dressing and using his heel healing sandal. 11/01/15; this is a patient I saw last week for the first time. He had a small open wound on the plantar aspect of his left foot at roughly the level of the base of his fifth metatarsal. He had a considerable degree of thickened skin around this wound on the plantar aspect which I thought was from chronic pressure on this area. He tells [place] that he had drainage over the course of the week. No systemic symptoms. 11/08/15; culture last week grew Citrobacter korseri. This should've been sensitive to the Augmentin I gave him. He has seen Dr. Doran Durand who did his initial surgery and according the patient the plan is to give this another month and then the hardware might need to come out of this. This seems like a reasonable plan. I will adjust his antibiotics to ciprofloxacin which probably should continue for at least another 2 weeks. I gave him 10 days worth today 11/22/15 the patient has completed antibiotics. He has an appointment with Dr. Doran Durand this Friday. There is improved dimensions around the wound on the left fifth metatarsal base 11/29/15; the patient has completed antibiotics last week. Apparently his appointment with Dr. Doran Durand it  is not until this Friday. Dimensions are roughly the same. 12/06/15; saw Dr. Doran Durand. No x-ray told the end of the month, next appointment June 30. We have been using Aquacel Ag 12/13/15: No major change this week. Using Aquacel AG MOMAR, SICURELLA. ([id number]) 621/17; arrived this week with maceration around the wound. There was quite a bit of undermining which required surgical debridement. I changed him to [facility] last week, by the patient's admission he was up on this more this week 12/27/15; macerated tissue around the wound is removed with a scalpel and pickups. There is no undermining. Nonviable subcutaneous tissue and skin taken from the superior circumference of the wound is slough from the surface. READMISSION 03/06/16 since I last saw this patient at the end of June, he went for surgery on 01/11/16 by Dr. Doran Durand of orthopedics. He had a left foot irrigation and debridement, removal of hardware and placement of wound VAC. He is also been followed by Dr. Megan Salon of infectious disease and completed a six-week course of IV Rocephin for group B strep and Enterobacter in the bone at the time of surgery. Apparently at the time of surgery the bone looked healthy so I don't think any bone was actually removed. He has been using silver alginate based dressings on the same wound area at the base of the left fifth metatarsal on the left. I note that he is also had arterial studies on 01/08/16, these showed a left ABI of 1.2 to and a right ABI of 1.3. Waveforms were listed as biphasic. He was not felt to have any specific arterial issues. 03/13/16; no real change in the condition of the wound at the left lateral foot at roughly the base of his fifth metatarsal. Use silver alginate last week. 03/18/16 arrives today with no open area. Being suspicious  of the overlying callus I. Some of this back although I see nothing but covering tissue here/epithelium. There is no surrounding  tenderness READMISSION 04/16/16 this is a patient I discharged about a month ago. Initially a surgical wound on the lateral aspect of his left foot which subsequently became infected. The story he is giving today that he went back into his own modified shoe started to notice pain 2 weeks ago he was seen in Dr. Nona Dell office by a physician assistant last Wednesday and according the patient was told that everything looked fine however this is clearly now broken open and he has an open wound in the same spot that we have been dealing with repetitively. Situation is complicated by the fact that he is running short of money on long-term disability. He has not taken his insulin and at least 2 weeks was previously on NovoLog short acting insulin on a sliding scale and TRESIBA 35 units at bedtime. He is no longer able to afford any of his medication he was in the x-ray on 10/15. A plain x-ray showed healed fracture of the left fifth metatarsal bone status post removal of the associated plate and screw fixation hardware. There was no acute appearing osseous abnormality. His blood work showed a white count of 9.2 with an essentially normal differential comprehensive metabolic panel was normal. Previous CT scan of the foot on 01/08/16 showed no osteomyelitis previous vascular workup showed no evidence of significant PAD on 01/13/16 04/23/16; culture I did last week grew enterococcus [ampicillin sensitive] and MRSA. He saw infectious disease yesterday. They stop the clindamycin and ordered an MRI. This is not unreasonable. All the hardware is out of the foot at this point. 04/30/16 at this point in time we are still awaiting the results of the MRI at this point in time. Patient did have an area which appears to be somewhat macerated in the proximal portion of the wound where there is overlying necrotic skin that is doing nothing more than trapping fluid underneath. He continues to state that he does have  some discomfort and again the concern is for the possibility of osteomyelitis hence the reason for the MRI order. We have been using a silver alginate dressing but again I think the reason this with his macerated as it was is that the dressing obscene could not reach the entirety of the wound due to some necrotic skin covering the proximal portion. No bleeding noted at this point in time on initial evaluation. 05/07/16 we receive the results of patient's MRI which shows that he has no evidence of osteomyelitis. This obviously is excellent news. Patient was definitely happy to hear this. He tells me the wound appears to be doing very well at this point in time and is pleased with the progress currently. [name], [name] ([id number]) 05/15/16; as noted the patient did not have osteomyelitis. He has been released by infectious disease and orthopedics. His wound is still open he had a debridement last week but complain when he got home he "bleeding for half a day". He is not had any pain. We have been using silver alginate with Kerlix gauze wrap. 05/28/16; patient arrives today with the wound in much the same condition as last time. He has a small opening on the lateral aspect of his foot however with debridement there is clear undermining medially there is no real evidence of infection here and I didn't see any point in culturing this. One would have to wonder if this  isn't a simple matter of in adequate offloading as he is been using a healing sandal. 06/05/16; the open area is now on the plantar aspect of his foot and not decide. The wound almost appears to have "migrated". This was the term use by our intake nurse. 06/12/16; open area on the plantar aspect of his foot. Base of the wound looks very healthy. This will be his second week in a total contact cast 06/19/16; patient arrived today in a total contact cast. There was some expectation from our staff and myself that this area would be healed.  Unfortunately the area was boggy and with rec pressure a fairly substantial amount of purulent drainage was obtained. Specimen obtained for culture. The patient had no complaints of systemic problems including fever or chills or instability of his diabetes. There was no pain in the foot. Nevertheless a extensive debridement was required. 06/26/16; patient's culture from the abscess last week grew a combination of MRSA and ampicillin sensitive enterococcus. I had him on Augmentin and Septra however I have elected to give him a full day course of Zyvox instead as I [place] treated this combination of organisms with Augmentin and Septra before. Arrives today with no systemic symptoms Electronic Signature(s) Signed: 06/26/2016 5:22:07 PM By: [name] MD Entered By: [name] on 06/26/2016 14:09:32 [name] ([id number]) -------------------------------------------------------------------------------- Physical Exam Details Patient Name: [name], [name]. Date of Service: 06/26/2016 1:15 PM Medical Record Patient Account Number: [id number] Number: Treating RN: Baruch Gouty, RN, BSN, Rita 10/08/1960 (55 y.o. Other Clinician: Date of Birth/Sex: Male) Treating Eivan Gallina Primary Care Physician/Extender: Ali Lowe, ADAKU Physician: Referring Physician: Gavin Pound Weeks in Treatment: 16 Constitutional Sitting or standing Blood Pressure is within target range for patient.. Pulse regular and within target range for patient.Marland Kitchen Respirations regular, non-labored and within target range.. Temperature is normal and within the target range for the patient.. Patient's appearance is neat and clean. Appears in no acute distress. Well nourished and well developed.. Notes 06/26/16; the wound actually looks much better than last week especially on the inferior part of his foot. The open area was debrided with a #3 curet of surface slough and nonviable subcutaneous tissue  hemostasis was silver nitrate. Electronic Signature(s) Signed: 06/26/2016 5:22:07 PM By: [name] MD Entered By: [name] on 06/26/2016 14:11:23 [name] ([id number]) -------------------------------------------------------------------------------- Physician Orders Details Patient Name: [name], [name]. Date of Service: 06/26/2016 1:15 PM Medical Record Patient Account Number: [id number] Number: Treating RN: Baruch Gouty, RN, BSN, Rita 02/26/1961 (55 y.o. Other Clinician: Date of Birth/Sex: Male) Treating Theseus Birnie Primary Care Physician/Extender: Ali Lowe, ADAKU Physician: Referring Physician: Judeth Horn in Treatment: 2 Verbal / Phone Orders: Yes Clinician: Afful, RN, BSN, Rita Read Back and Verified: Yes Diagnosis Coding Wound Cleansing Wound #6 Left,Lateral Foot o Clean wound with Normal Saline. Anesthetic Wound #6 Left,Lateral Foot o Topical Lidocaine 4% cream applied to wound bed prior to debridement Primary Wound Dressing Wound #6 Left,Lateral Foot o Aquacel Ag Secondary Dressing Wound #6 Left,Lateral Foot o Gauze and Kerlix/Conform Dressing Change Frequency Wound #6 Left,Lateral Foot o Change dressing every day. Follow-up Appointments Wound #6 Left,Lateral Foot o Return Appointment in 1 week. Off-Loading Wound #6 Left,Lateral Foot o Open toe surgical shoe with peg assist. Additional Orders / Instructions Wound #6 Left,Lateral Foot o Increase protein intake. o Activity as tolerated [name], [name]. ([id number]) Medications-please add to medication list. Wound #6 Left,Lateral Foot o P.O. Antibiotics - zyvox started 06/25/16 Electronic Signature(s) Signed:  06/26/2016 4:25:56 PM By: Regan Lemming BSN, RN Signed: 06/26/2016 5:22:07 PM By: [name] MD Entered By: Regan Lemming on 06/26/2016 14:00:18 [name]  ([id number]) -------------------------------------------------------------------------------- Problem List Details Patient Name: [name], [name]. Date of Service: 06/26/2016 1:15 PM Medical Record Patient Account Number: [id number] Number: Treating RN: Baruch Gouty, RN, BSN, Rita 10/11/1960 (55 y.o. Other Clinician: Date of Birth/Sex: Male) Treating Bradey Luzier Primary Care Physician/Extender: Ali Lowe, Doreene Burke Physician: Referring Physician: Judeth Horn in Treatment: 16 Active Problems ICD-10 Encounter Code Description Active Date Diagnosis E11.621 Type 2 diabetes mellitus with foot ulcer 03/06/2016 Yes L97.523 Non-pressure chronic ulcer of other part of left foot with 03/06/2016 Yes necrosis of muscle Inactive Problems ICD-10 Code Description Active Date Inactive Date M86.672 Other chronic osteomyelitis, left ankle and foot 03/06/2016 03/06/2016 Resolved Problems Electronic Signature(s) Signed: 06/26/2016 5:22:07 PM By: [name] MD Entered By: [name] on 06/26/2016 14:06:51 [name] ([id number]) -------------------------------------------------------------------------------- Progress Note Details Patient Name: [name]. Date of Service: 06/26/2016 1:15 PM Medical Record Patient Account Number: [id number] Number: Treating RN: Baruch Gouty, RN, BSN, Rita 08-Aug-1960 (55 y.o. Other Clinician: Date of Birth/Sex: Male) Treating Tanaiya Kolarik Primary Care Physician/Extender: Garlan Fair Physician: Referring Physician: Judeth Horn in Treatment: 16 Subjective Chief Complaint Information obtained from Patient 03/06/16; patient returns today after further surgery on the left foot as well as a 6 week course of IV antibiotics History of Present Illness (HPI) Pleasant 55 year old with history of diabetes (Hgb A1c 10.8 in 2014) and peripheral neuropathy. No PVD. L ABI 1.1. Status post right great toe partial amputation years  ago. He was at work and on 10/22/2014, was injured by a cart, and suffered an ulceration to his left anterior calf. He says that it subsequently became infected, and he was treated with a course of antibiotics. He was found on initial exam to have an ulceration on the dorsum of his left third toe. He was unaware of this and attributes it to pressure from his steel toed boots. More recently he injured his right anterior calf on a cart. Ambulating normally per his baseline. He has been undergoing regular debridements, applying mupirocin cream, and an Ace wrap for edema control. He returns to clinic for follow-up and is without complaints. No pain. No fever or chills. No drainage. 10/25/15; this is a 55 year old man who has type II diabetes with diabetic polyneuropathy. He tells me that he fractured his left fifth metatarsal in June 2016 when he presented with swelling. He does not recall a specific injury. His hemoglobin A1c was apparently too high at the time for consideration of surgery and he was put in some form of offloading. Ultimately he went to surgery in December with an allograft from his calcaneus to this site, plate and screws. He had an x-ray of the foot in March that showed concerns about nonunion. He tells me that in March he had to move and basically moved himself. He was on his foot a lot and then noticed some drainage from an open area. He has been following with his orthopedic surgeon Dr. Doran Durand. He has been applying a felt donut, dry dressing and using his heel healing sandal. 11/01/15; this is a patient I saw last week for the first time. He had a small open wound on the plantar aspect of his left foot at roughly the level of the base of his fifth metatarsal. He had a considerable degree of thickened skin around this wound on the  plantar aspect which I thought was from chronic pressure on this area. He tells [place] that he had drainage over the course of the week. No systemic  symptoms. 11/08/15; culture last week grew Citrobacter korseri. This should've been sensitive to the Augmentin I gave him. He has seen Dr. Doran Durand who did his initial surgery and according the patient the plan is to give this another month and then the hardware might need to come out of this. This seems like a reasonable plan. I will adjust his antibiotics to ciprofloxacin which probably should continue for at least another 2 weeks. I gave him 10 days worth today 11/22/15 the patient has completed antibiotics. He has an appointment with Dr. Doran Durand this Friday. There is SALIK, WINEMAN ([id number]) improved dimensions around the wound on the left fifth metatarsal base 11/29/15; the patient has completed antibiotics last week. Apparently his appointment with Dr. Doran Durand it is not until this Friday. Dimensions are roughly the same. 12/06/15; saw Dr. Doran Durand. No x-ray told the end of the month, next appointment June 30. We have been using Aquacel Ag 12/13/15: No major change this week. Using Aquacel AG 621/17; arrived this week with maceration around the wound. There was quite a bit of undermining which required surgical debridement. I changed him to [facility] last week, by the patient's admission he was up on this more this week 12/27/15; macerated tissue around the wound is removed with a scalpel and pickups. There is no undermining. Nonviable subcutaneous tissue and skin taken from the superior circumference of the wound is slough from the surface. READMISSION 03/06/16 since I last saw this patient at the end of June, he went for surgery on 01/11/16 by Dr. Doran Durand of orthopedics. He had a left foot irrigation and debridement, removal of hardware and placement of wound VAC. He is also been followed by Dr. Megan Salon of infectious disease and completed a six-week course of IV Rocephin for group B strep and Enterobacter in the bone at the time of surgery. Apparently at the time of surgery the bone looked  healthy so I don't think any bone was actually removed. He has been using silver alginate based dressings on the same wound area at the base of the left fifth metatarsal on the left. I note that he is also had arterial studies on 01/08/16, these showed a left ABI of 1.2 to and a right ABI of 1.3. Waveforms were listed as biphasic. He was not felt to have any specific arterial issues. 03/13/16; no real change in the condition of the wound at the left lateral foot at roughly the base of his fifth metatarsal. Use silver alginate last week. 03/18/16 arrives today with no open area. Being suspicious of the overlying callus I. Some of this back although I see nothing but covering tissue here/epithelium. There is no surrounding tenderness READMISSION 04/16/16 this is a patient I discharged about a month ago. Initially a surgical wound on the lateral aspect of his left foot which subsequently became infected. The story he is giving today that he went back into his own modified shoe started to notice pain 2 weeks ago he was seen in Dr. Nona Dell office by a physician assistant last Wednesday and according the patient was told that everything looked fine however this is clearly now broken open and he has an open wound in the same spot that we have been dealing with repetitively. Situation is complicated by the fact that he is running short of money on long-term  disability. He has not taken his insulin and at least 2 weeks was previously on NovoLog short acting insulin on a sliding scale and TRESIBA 35 units at bedtime. He is no longer able to afford any of his medication he was in the x-ray on 10/15. A plain x-ray showed healed fracture of the left fifth metatarsal bone status post removal of the associated plate and screw fixation hardware. There was no acute appearing osseous abnormality. His blood work showed a white count of 9.2 with an essentially normal differential comprehensive metabolic panel was  normal. Previous CT scan of the foot on 01/08/16 showed no osteomyelitis previous vascular workup showed no evidence of significant PAD on 01/13/16 04/23/16; culture I did last week grew enterococcus [ampicillin sensitive] and MRSA. He saw infectious disease yesterday. They stop the clindamycin and ordered an MRI. This is not unreasonable. All the hardware is out of the foot at this point. 04/30/16 at this point in time we are still awaiting the results of the MRI at this point in time. Patient did have an area which appears to be somewhat macerated in the proximal portion of the wound where there is overlying necrotic skin that is doing nothing more than trapping fluid underneath. He continues to state that he does have some discomfort and again the concern is for the possibility of osteomyelitis hence the reason [name], [name]. ([id number]) for the MRI order. We have been using a silver alginate dressing but again I think the reason this with his macerated as it was is that the dressing obscene could not reach the entirety of the wound due to some necrotic skin covering the proximal portion. No bleeding noted at this point in time on initial evaluation. 05/07/16 we receive the results of patient's MRI which shows that he has no evidence of osteomyelitis. This obviously is excellent news. Patient was definitely happy to hear this. He tells me the wound appears to be doing very well at this point in time and is pleased with the progress currently. 05/15/16; as noted the patient did not have osteomyelitis. He has been released by infectious disease and orthopedics. His wound is still open he had a debridement last week but complain when he got home he "bleeding for half a day". He is not had any pain. We have been using silver alginate with Kerlix gauze wrap. 05/28/16; patient arrives today with the wound in much the same condition as last time. He has a small opening on the lateral aspect of his  foot however with debridement there is clear undermining medially there is no real evidence of infection here and I didn't see any point in culturing this. One would have to wonder if this isn't a simple matter of in adequate offloading as he is been using a healing sandal. 06/05/16; the open area is now on the plantar aspect of his foot and not decide. The wound almost appears to have "migrated". This was the term use by our intake nurse. 06/12/16; open area on the plantar aspect of his foot. Base of the wound looks very healthy. This will be his second week in a total contact cast 06/19/16; patient arrived today in a total contact cast. There was some expectation from our staff and myself that this area would be healed. Unfortunately the area was boggy and with rec pressure a fairly substantial amount of purulent drainage was obtained. Specimen obtained for culture. The patient had no complaints of systemic problems including fever or chills  or instability of his diabetes. There was no pain in the foot. Nevertheless a extensive debridement was required. 06/26/16; patient's culture from the abscess last week grew a combination of MRSA and ampicillin sensitive enterococcus. I had him on Augmentin and Septra however I have elected to give him a full day course of Zyvox instead as I [place]isley treated this combination of organisms with Augmentin and Septra before. Arrives today with no systemic symptoms Objective Constitutional Sitting or standing Blood Pressure is within target range for patient.. Pulse regular and within target range for patient.Marland Kitchen Respirations regular, non-labored and within target range.. Temperature is normal and within the target range for the patient.. Patient's appearance is neat and clean. Appears in no acute distress. Well nourished and well developed.. Vitals Time Taken: 1:34 PM, Height: 70 in, Weight: 260 lbs, BMI: 37.3, Temperature: 98.1 F, Pulse: 100 bpm, Respiratory  Rate: 16 breaths/min, Blood Pressure: 155/78 mmHg. General Notes: 06/26/16; the wound actually looks much better than last week especially on the inferior part of his foot. The open area was debrided with a #3 curet of surface slough and nonviable subcutaneous tissue hemostasis was silver nitrate. Integumentary (Hair, Skin) [name], [name]. ([id number]) Wound #6 status is Open. Original cause of wound was Gradually Appeared. The wound is located on the Left,Lateral Foot. The wound measures 2cm length x 2cm width x 0.3cm depth; 3.142cm^2 area and 0.942cm^3 volume. The wound is limited to skin breakdown. There is no tunneling or undermining noted. There is a large amount of serosanguineous drainage noted. The wound margin is thickened. There is large (67-100%) pink, pale granulation within the wound bed. There is a small (1-33%) amount of necrotic tissue within the wound bed including Adherent Slough. The periwound skin appearance exhibited: Callus, Moist. The periwound skin appearance did not exhibit: Crepitus, Excoriation, Fluctuance, Friable, Induration, Localized Edema, Rash, Scarring, Dry/Scaly, Maceration, Atrophie Blanche, Cyanosis, Ecchymosis, Hemosiderin Staining, Mottled, Pallor, Rubor, Erythema. Periwound temperature was noted as No Abnormality. 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 There was a Skin/Subcutaneous Tissue Debridement [id number]) debridement with total area of 4 sq cm performed by Ricard Dillon, MD. with the following instrument(s): Curette to remove Non-Viable tissue/material including Fibrin/Slough and Subcutaneous after achieving pain control using Lidocaine 4% Topical Solution. A time out was conducted at 13:50,  prior to the start of the procedure. A Minimum amount of bleeding was controlled with Pressure. The procedure was tolerated well with a pain level of 0 throughout and a pain level of 0 following the procedure. Post Debridement Measurements: 2cm length x 2cm width x 0.3cm depth; 0.942cm^3 volume. Character of Wound/Ulcer Post Debridement requires further debridement. Severity of Tissue Post Debridement is: Fat layer exposed. Post procedure Diagnosis Wound #6: Same as Pre-Procedure Plan ALTHA, GILLEO ([id number]) Wound Cleansing: Wound #6 Left,Lateral Foot: Clean wound with Normal Saline. Anesthetic: Wound #6 Left,Lateral Foot: Topical Lidocaine 4% cream applied to wound bed prior to debridement Primary Wound Dressing: Wound #6 Left,Lateral Foot: Aquacel Ag Secondary Dressing: Wound #6 Left,Lateral Foot: Gauze and Kerlix/Conform Dressing Change Frequency: Wound #6 Left,Lateral Foot: Change dressing every day. Follow-up Appointments: Wound #6 Left,Lateral Foot: Return Appointment in 1 week. Off-Loading: Wound #6 Left,Lateral Foot: Open toe surgical shoe with peg assist. Additional Orders / Instructions: Wound #  6 Left,Lateral Foot: Increase protein intake. Activity as tolerated Medications-please add to medication list.: Wound #6 Left,Lateral Foot: P.O. Antibiotics - zyvox started 06/25/16 10 day course of Zyvox which should cover both organisms continue aquacel AG if this is not successufl in healing this wound he'll need an MRI and IV antibiotics Electronic Signature(s) Signed: 06/26/2016 5:22:07 PM By: [name] MD Entered By: [name] on 06/26/2016 14:13:01 [name] ([id number]) -------------------------------------------------------------------------------- SuperBill Details Patient Name: [name]. Date of Service: 06/26/2016 Medical Record Patient Account Number: [id number] Number: Treating RN: Baruch Gouty, RN, BSN, Rita November 29, 1960  (55 y.o. Other Clinician: Date of Birth/Sex: Male) Treating Rovena Hearld, [place] Primary Care Physician: Gavin Pound Physician/Extender: G Referring Physician: Judeth Horn in Treatment: 16 Diagnosis Coding ICD-10 Codes Code Description E11.621 Type 2 diabetes mellitus with foot ulcer L97.523 Non-pressure chronic ulcer of other part of left foot with necrosis of muscle Facility Procedures CPT4 Code Description: [id number] - DEB SUBQ TISSUE 20 SQ CM/< ICD-10 Description Diagnosis E11.621 Type 2 diabetes mellitus with foot ulcer L97.523 Non-pressure chronic ulcer of other part of left foot Modifier: with necrosi Quantity: 1 s of muscle Physician Procedures CPT4 Code Description: [id number] - WC PHYS SUBQ TISS 20 SQ CM ICD-10 Description Diagnosis E11.621 Type 2 diabetes mellitus with foot ulcer L97.523 Non-pressure chronic ulcer of other part of left foot Modifier: with necrosis Quantity: 1 of muscle Electronic Signature(s) Signed: 06/26/2016 5:22:07 PM By: [name] MD Entered By: [name] on 06/26/2016 14:13:36

## 2016-07-03 ENCOUNTER — Encounter: Payer: Commercial Managed Care - PPO | Attending: Internal Medicine | Admitting: Internal Medicine

## 2016-07-03 DIAGNOSIS — E1142 Type 2 diabetes mellitus with diabetic polyneuropathy: Secondary | ICD-10-CM | POA: Diagnosis not present

## 2016-07-03 DIAGNOSIS — L97523 Non-pressure chronic ulcer of other part of left foot with necrosis of muscle: Secondary | ICD-10-CM | POA: Diagnosis not present

## 2016-07-03 DIAGNOSIS — E11621 Type 2 diabetes mellitus with foot ulcer: Secondary | ICD-10-CM | POA: Diagnosis present

## 2016-07-03 NOTE — Progress Notes (Addendum)
KIYAAN, BLANKENBILLER ([id number]) Visit Report for 07/03/2016 Arrival Information Details Patient Name: [name], [name]. Date of Service: 07/03/2016 1:30 PM Medical Record Patient Account Number: [id number] Number: Treating RN: Baruch Gouty, RN, BSN, Rita [birth year] (56 y.o. Other Clinician: Date of Birth/Sex: Male) Treating ROBSON, MICHAEL Primary Care Physician: NNODI, ADAKU Physician/Extender: G Referring Physician: Judeth Horn in Treatment: 60 Visit Information History Since Last Visit All ordered tests and consults were completed: No Patient Arrived: Ambulatory Added or deleted any medications: No Arrival Time: 13:41 Any new allergies or adverse reactions: No Accompanied By: self Had a fall or experienced change in No Transfer Assistance: None activities of daily living that may affect Patient Identification Verified: Yes risk of falls: Secondary Verification Process Yes Signs or symptoms of abuse/neglect since last No Completed: visito Patient Requires Transmission-Based No Hospitalized since last visit: No Precautions: Has Dressing in Place as Prescribed: Yes Patient Has Alerts: Yes Pain Present Now: No Patient Alerts: DM II Electronic Signature(s) Signed: 07/03/2016 1:42:12 PM By: Regan Lemming BSN, RN Entered By: Regan Lemming on 07/03/2016 13:42:12 [name] ([id number]) -------------------------------------------------------------------------------- Encounter Discharge Information Details Patient Name: [name]. Date of Service: 07/03/2016 1:30 PM Medical Record Patient Account Number: [id number] Number: Treating RN: Baruch Gouty, RN, BSN, Rita 11-19-1960 (56 y.o. Other Clinician: Date of Birth/Sex: Male) Treating ROBSON, MICHAEL Primary Care Physician: Gavin Pound Physician/Extender: G Referring Physician: Judeth Horn in Treatment: 17 Encounter Discharge Information Items Discharge Pain Level: 0 Discharge Condition:  Stable Ambulatory Status: Ambulatory Discharge Destination: Home Transportation: Private Auto Accompanied By: self Schedule Follow-up Appointment: No Medication Reconciliation completed and provided to Patient/Care No Teyla Skidgel: Provided on Clinical Summary of Care: 07/03/2016 Form Type Recipient Paper Patient HP Electronic Signature(s) Signed: 07/03/2016 2:14:49 PM By: Ruthine Dose Entered By: Ruthine Dose on 07/03/2016 14:14:49 [name] ([id number]) -------------------------------------------------------------------------------- Lower Extremity Assessment Details Patient Name: [name]. Date of Service: 07/03/2016 1:30 PM Medical Record Patient Account Number: [id number] Number: Treating RN: Baruch Gouty, RN, BSN, Rita [birth year] (56 y.o. Other Clinician: Date of Birth/Sex: Male) Treating ROBSON, MICHAEL Primary Care Physician: Gavin Pound Physician/Extender: G Referring Physician: Judeth Horn in Treatment: 17 Vascular Assessment Pulses: Dorsalis Pedis Palpable: [Left:Yes] Posterior Tibial Extremity colors, hair growth, and conditions: Extremity Color: [Left:Dusky] Hair Growth on Extremity: [Left:No] Temperature of Extremity: [Left:Warm] Capillary Refill: [Left:< 3 seconds] Toe Nail Assessment Left: Right: Thick: Yes Discolored: Yes Deformed: No Improper Length and Hygiene: No Electronic Signature(s) Signed: 07/03/2016 1:43:03 PM By: Regan Lemming BSN, RN Entered By: Regan Lemming on 07/03/2016 13:43:03 [name] ([id number]) -------------------------------------------------------------------------------- Multi Wound Chart Details Patient Name: [name]. Date of Service: 07/03/2016 1:30 PM Medical Record Patient Account Number: [id number] Number: Treating RN: Baruch Gouty, RN, BSN, Rita 11-12-1960 (56 y.o. Other Clinician: Date of Birth/Sex: Male) Treating ROBSON, MICHAEL Primary Care Physician: Gavin Pound Physician/Extender: G Referring Physician: Judeth Horn in Treatment: 11 Photos: [6:No Photos] [N/A:N/A] Wound Location: [6:Left Foot - Lateral] [N/A:N/A] Wounding Event: [6:Gradually Appeared] [N/A:N/A] Primary Etiology: [6:Diabetic Wound/Ulcer of the Lower Extremity] [N/A:N/A] Comorbid History: [6:Hypertension, Type II Diabetes, Neuropathy] [N/A:N/A] Date Acquired: [6:03/25/2016] [N/A:N/A] Weeks of Treatment: [6:11] [N/A:N/A] Wound Status: [6:Open] [N/A:N/A] Measurements L x W x D 1x0.9x0.3 [N/A:N/A] (cm) Area (cm) : [6:0.707] [N/A:N/A] Volume (cm) : [6:0.212] [N/A:N/A] % Reduction in Area: [6:85.00%] [N/A:N/A] % Reduction in Volume: 77.50% [N/A:N/A] Classification: [6:Grade 1] [N/A:N/A] Exudate Amount: [6:Large] [N/A:N/A] Exudate Type: [6:Serosanguineous] [N/A:N/A] Exudate Color: [6:red, brown] [N/A:N/A] Wound Margin: [6:Thickened] [N/A:N/A] Granulation  Amount: [6:Large (67-100%)] [N/A:N/A] Granulation Quality: [6:Pink, Pale] [N/A:N/A] Necrotic Amount: [6:Small (1-33%)] [N/A:N/A] Exposed Structures: [6:Fascia: No Fat: No Tendon: No Muscle: No Joint: No Bone: No Limited to Skin Breakdown] [N/A:N/A] Epithelialization: [6:Small (1-33%)] [N/A:N/A] Debridement: [6:Debridement ([id number]) 14:01] [N/A:N/A N/A] Pre-procedure Verification/Time Out Taken: Pain Control: Lidocaine 4% Topical N/A N/A Solution Tissue Debrided: Fibrin/Slough, N/A N/A Subcutaneous Level: Skin/Subcutaneous N/A N/A Tissue Debridement Area (sq 0.9 N/A N/A cm): Instrument: Curette N/A N/A Bleeding: Minimum N/A N/A Hemostasis Achieved: Pressure N/A N/A Procedural Pain: 0 N/A N/A Post Procedural Pain: 0 N/A N/A Debridement Treatment Procedure was tolerated N/A N/A Response: well Post Debridement 1x0.9x0.3 N/A N/A Measurements L x W x D (cm) Post Debridement 0.212 N/A N/A Volume: (cm) Periwound Skin Texture: Callus: Yes N/A N/A Edema: No Excoriation: No Induration:  No Crepitus: No Fluctuance: No Friable: No Rash: No Scarring: No Periwound Skin Moist: Yes N/A N/A Moisture: Maceration: No Dry/Scaly: No Periwound Skin Color: Atrophie Blanche: No N/A N/A Cyanosis: No Ecchymosis: No Erythema: No Hemosiderin Staining: No Mottled: No Pallor: No Rubor: No Temperature: No Abnormality N/A N/A Tenderness on No N/A N/A Palpation: Wound Preparation: Ulcer Cleansing: N/A N/A Rinsed/Irrigated with Saline [name], [name] ([id number]) Topical Anesthetic Applied: Other: Lidocaine 4% Procedures Performed: Debridement N/A N/A Treatment Notes Wound #6 (Left, Lateral Foot) 1. Cleansed with: Cleanse wound with antibacterial soap and water 4. Dressing Applied: Aquacel Ag 5. Secondary Dressing Applied Guaze, ABD and kerlix/Conform 7. Secured with Recruitment consultant) Signed: 07/03/2016 5:37:12 PM By: [name] MD Entered By: [name] on 07/03/2016 14:25:41 [name] ([id number]) -------------------------------------------------------------------------------- [place] Details Patient Name: [name], [name]. Date of Service: 07/03/2016 1:30 PM Medical Record Patient Account Number: [id number] Number: Treating RN: Baruch Gouty, RN, BSN, Rita [birth year] (56 y.o. Other Clinician: Date of Birth/Sex: Male) Treating ROBSON, [place] Primary Care Physician: Gavin Pound Physician/Extender: G Referring Physician: Judeth Horn in Treatment: 17 Active Inactive Orientation to the Wound Care Program Nursing Diagnoses: Knowledge deficit related to the wound healing center program Goals: Patient/caregiver will verbalize understanding of the [facility] Program Date Initiated: 04/16/2016 Goal Status: Active Interventions: Provide education on orientation to the wound center Notes: Wound/Skin Impairment Nursing Diagnoses: Impaired tissue integrity Knowledge deficit related to  ulceration/compromised skin integrity Goals: Patient/caregiver will verbalize understanding of skin care regimen Date Initiated: 04/16/2016 Goal Status: Active Ulcer/skin breakdown will have a volume reduction of 30% by week 4 Date Initiated: 04/16/2016 Goal Status: Active Ulcer/skin breakdown will have a volume reduction of 50% by week 8 Date Initiated: 04/16/2016 Goal Status: Active Ulcer/skin breakdown will have a volume reduction of 80% by week 12 Date Initiated: 04/16/2016 Goal Status: Active Ulcer/skin breakdown will heal within 14 weeks [name], [name] ([id number]) Date Initiated: 04/16/2016 Goal Status: Active Interventions: Assess patient/caregiver ability to obtain necessary supplies Assess patient/caregiver ability to perform ulcer/skin care regimen upon admission and as needed Assess ulceration(s) every visit Provide education on ulcer and skin care Treatment Activities: Skin care regimen initiated : 03/06/2016 Topical wound management initiated : 03/06/2016 Notes: Electronic Signature(s) Signed: 07/03/2016 4:04:09 PM By: Regan Lemming BSN, RN Entered By: Regan Lemming on 07/03/2016 13:48:30 [name] ([id number]) -------------------------------------------------------------------------------- Pain Assessment Details Patient Name: [name]. Date of Service: 07/03/2016 1:30 PM Medical Record Patient Account Number: [id number] Number: Treating RN: Baruch Gouty, RN, BSN, Rita [birth year] (56 y.o. Other Clinician: Date of Birth/Sex: Male) Treating ROBSON, [place] Primary Care Physician: Gavin Pound Physician/Extender: G Referring Physician: Judeth Horn  in Treatment: 17 Active Problems Location of Pain Severity and Description of Pain Patient Has Paino No Site Locations With Dressing Change: No Pain Management and Medication Current Pain Management: Electronic Signature(s) Signed: 07/03/2016 1:42:22 PM By: Regan Lemming BSN, RN Entered By: Regan Lemming on 07/03/2016 13:42:22 [name] ([id number]) -------------------------------------------------------------------------------- Patient/Caregiver Education Details Patient Name: [name]. Date of Service: 07/03/2016 1:30 PM Medical Record Patient Account Number: [id number] Number: Treating RN: Baruch Gouty, RN, BSN, Rita February 06, 1961 (56 y.o. Other Clinician: Date of Birth/Gender: Male) Treating ROBSON, [place] Primary Care Physician: Gavin Pound Physician/Extender: G Referring Physician: Judeth Horn in Treatment: 17 Education Assessment Education Provided To: Patient Education Topics Provided Welcome To [facility]: Methods: Explain/Verbal Wound/Skin Impairment: Methods: Explain/Verbal Responses: State content correctly Electronic Signature(s) Signed: 07/03/2016 4:04:09 PM By: Regan Lemming BSN, RN Entered By: Regan Lemming on 07/03/2016 14:05:49 [name] ([id number]) -------------------------------------------------------------------------------- Wound Assessment Details Patient Name: [name]. Date of Service: 07/03/2016 1:30 PM Medical Record Patient Account Number: [id number] Number: Treating RN: Baruch Gouty, RN, BSN, Rita February 18, 1961 (56 y.o. Other Clinician: Date of Birth/Sex: Male) Treating ROBSON, MICHAEL Primary Care Physician: Gavin Pound Physician/Extender: G Referring Physician: Gavin Pound Weeks in Treatment: 17 Wound Status Wound Number: 6 Primary Diabetic Wound/Ulcer of the Lower Etiology: Extremity Wound Location: Left Foot - Lateral Wound Status: Open Wounding Event: Gradually Appeared Comorbid Hypertension, Type II Diabetes, Date Acquired: 03/25/2016 History: Neuropathy Weeks Of Treatment: 11 Clustered Wound: No Wound Measurements Length: (cm) 1 Width: (cm) 0.9 Depth: (cm) 0.3 Area: (cm) 0.707 Volume: (cm) 0.212 % Reduction in Area: 85% % Reduction in Volume: 77.5% Epithelialization: Small  (1-33%) Tunneling: No Undermining: No Wound Description Classification: Grade 1 Foul Odor Afte Wound Margin: Thickened Exudate Amount: Large Exudate Type: Serosanguineous Exudate Color: red, brown r Cleansing: No Wound Bed Granulation Amount: Large (67-100%) Exposed Structure Granulation Quality: Pink, Pale Fascia Exposed: No Necrotic Amount: Small (1-33%) Fat Layer Exposed: No Necrotic Quality: Adherent Slough Tendon Exposed: No Muscle Exposed: No Joint Exposed: No Bone Exposed: No Limited to Skin Breakdown Periwound Skin Texture Texture Color No Abnormalities Noted: No No Abnormalities Noted: No Callus: Yes Atrophie Blanche: No Crepitus: No Cyanosis: No [name], [name] ([id number]) Excoriation: No Ecchymosis: No Fluctuance: No Erythema: No Friable: No Hemosiderin Staining: No Induration: No Mottled: No Localized Edema: No Pallor: No Rash: No Rubor: No Scarring: No Temperature / Pain Moisture Temperature: No Abnormality No Abnormalities Noted: No Dry / Scaly: No Maceration: No Moist: Yes Wound Preparation Ulcer Cleansing: Rinsed/Irrigated with Saline Topical Anesthetic Applied: Other: Lidocaine 4%, Treatment Notes Wound #6 (Left, Lateral Foot) 1. Cleansed with: Cleanse wound with antibacterial soap and water 4. Dressing Applied: Aquacel Ag 5. Secondary Dressing Applied Guaze, ABD and kerlix/Conform 7. Secured with Recruitment consultant) Signed: 07/03/2016 4:04:09 PM By: Regan Lemming BSN, RN Entered By: Regan Lemming on 07/03/2016 13:48:25 [name] ([id number]) -------------------------------------------------------------------------------- Vitals Details Patient Name: [name]. Date of Service: 07/03/2016 1:30 PM Medical Record Patient Account Number: [id number] Number: Treating RN: Baruch Gouty, RN, BSN, Rita 1960/12/09 (56 y.o. Other Clinician: Date of Birth/Sex: Male) Treating ROBSON, MICHAEL Primary Care Physician:  NNODI, ADAKU Physician/Extender: G Referring Physician: Gavin Pound Weeks in Treatment: 17 Vital Signs Time Taken: 13:42 Reference Range: 80 - 120 mg / dl Height (in): 70 Weight (lbs): 260 Body Mass Index (BMI): 37.3 Electronic Signature(s) Signed: 07/03/2016 1:42:35 PM By: Regan Lemming BSN, RN Entered By: Regan Lemming on 07/03/2016 13:42:35

## 2016-07-04 NOTE — Progress Notes (Signed)
KIRA, TROM ([id number]) Visit Report for 07/03/2016 Chief Complaint Document Details Patient Name: [name], [name]. Date of Service: 07/03/2016 1:30 PM Medical Record Patient Account Number: [id number] Number: Treating RN: Baruch Gouty, RN, BSN, Rita 02/23/1961 (56 y.o. Other Clinician: Date of Birth/Sex: Male) Treating Brihana Quickel Primary Care Physician/Extender: Garlan Fair Physician: Referring Physician: Judeth Horn in Treatment: 17 Information Obtained from: Patient Chief Complaint 03/06/16; patient returns today after further surgery on the left foot as well as a 6 week course of IV antibiotics Electronic Signature(s) Signed: 07/03/2016 5:37:12 PM By: [name] MD Entered By: [name] on 07/03/2016 14:26:11 [name] ([id number]) -------------------------------------------------------------------------------- Debridement Details Patient Name: [name]. Date of Service: 07/03/2016 1:30 PM Medical Record Patient Account Number: [id number] Number: Treating RN: Baruch Gouty, RN, BSN, Rita 17-May-1961 (56 y.o. Other Clinician: Date of Birth/Sex: Male) Treating Synthia Fairbank Primary Care Physician/Extender: Ali Lowe, ADAKU Physician: Referring Physician: Judeth Horn in Treatment: 17 Debridement Performed for Wound #6 Left,Lateral Foot Assessment: Performed By: Physician Ricard Dillon, MD Debridement: Debridement Pre-procedure Yes - 14:01 Verification/Time Out Taken: Start Time: 14:01 Pain Control: Lidocaine 4% Topical Solution Level: Skin/Subcutaneous Tissue Total Area Debrided (L x 1 (cm) x 0.9 (cm) = 0.9 (cm) W): Tissue and other Non-Viable, Fibrin/Slough, Subcutaneous material debrided: Instrument: Curette Bleeding: Minimum Hemostasis Achieved: Pressure End Time: 14:04 Procedural Pain: 0 Post Procedural Pain: 0 Response to Treatment: Procedure was tolerated well Post Debridement Measurements of Total  Wound Length: (cm) 1 Width: (cm) 0.9 Depth: (cm) 0.3 Volume: (cm) 0.212 Character of Wound/Ulcer Post Stable Debridement: Severity of Tissue Post Debridement: Fat layer exposed Post Procedure Diagnosis Same as Pre-procedure Electronic Signature(s) Signed: 07/03/2016 4:04:09 PM By: Regan Lemming BSN, RN [name], [name] ([id number]) Signed: 07/03/2016 5:37:12 PM By: [name] MD Entered By: [name] on 07/03/2016 14:25:55 [name] ([id number]) -------------------------------------------------------------------------------- HPI Details Patient Name: [name], [name]. Date of Service: 07/03/2016 1:30 PM Medical Record Patient Account Number: [id number] Number: Treating RN: Baruch Gouty, RN, BSN, Rita 04-13-1961 (56 y.o. Other Clinician: Date of Birth/Sex: Male) Treating Jori Thrall Primary Care Physician/Extender: Ali Lowe, ADAKU Physician: Referring Physician: Gavin Pound Weeks in Treatment: 17 History of Present Illness HPI Description: Pleasant 55 year old with history of diabetes (Hgb A1c 10.8 in 2014) and peripheral neuropathy. No PVD. L ABI 1.1. Status post right great toe partial amputation years ago. He was at work and on 10/22/2014, was injured by a cart, and suffered an ulceration to his left anterior calf. He says that it subsequently became infected, and he was treated with a course of antibiotics. He was found on initial exam to have an ulceration on the dorsum of his left third toe. He was unaware of this and attributes it to pressure from his steel toed boots. More recently he injured his right anterior calf on a cart. Ambulating normally per his baseline. He has been undergoing regular debridements, applying mupirocin cream, and an Ace wrap for edema control. He returns to clinic for follow-up and is without complaints. No pain. No fever or chills. No drainage. 10/25/15; this is a 56 year old man who has type II diabetes with diabetic  polyneuropathy. He tells me that he fractured his left fifth metatarsal in June 2016 when he presented with swelling. He does not recall a specific injury. His hemoglobin A1c was apparently too high at the time for consideration of surgery and he was put in some form of offloading. Ultimately he went to surgery in  December with an allograft from his calcaneus to this site, plate and screws. He had an x-ray of the foot in March that showed concerns about nonunion. He tells me that in March he had to move and basically moved himself. He was on his foot a lot and then noticed some drainage from an open area. He has been following with his orthopedic surgeon Dr. Doran Durand. He has been applying a felt donut, dry dressing and using his heel healing sandal. 11/01/15; this is a patient I saw last week for the first time. He had a small open wound on the plantar aspect of his left foot at roughly the level of the base of his fifth metatarsal. He had a considerable degree of thickened skin around this wound on the plantar aspect which I thought was from chronic pressure on this area. He tells [place] that he had drainage over the course of the week. No systemic symptoms. 11/08/15; culture last week grew Citrobacter korseri. This should've been sensitive to the Augmentin I gave him. He has seen Dr. Doran Durand who did his initial surgery and according the patient the plan is to give this another month and then the hardware might need to come out of this. This seems like a reasonable plan. I will adjust his antibiotics to ciprofloxacin which probably should continue for at least another 2 weeks. I gave him 10 days worth today 11/22/15 the patient has completed antibiotics. He has an appointment with Dr. Doran Durand this Friday. There is improved dimensions around the wound on the left fifth metatarsal base 11/29/15; the patient has completed antibiotics last week. Apparently his appointment with Dr. Doran Durand it is not until this  Friday. Dimensions are roughly the same. 12/06/15; saw Dr. Doran Durand. No x-ray told the end of the month, next appointment June 30. We have been using Aquacel Ag 12/13/15: No major change this week. Using Aquacel AG COSMOS, RAUSCHENBACH. ([id number]) 621/17; arrived this week with maceration around the wound. There was quite a bit of undermining which required surgical debridement. I changed him to [facility] last week, by the patient's admission he was up on this more this week 12/27/15; macerated tissue around the wound is removed with a scalpel and pickups. There is no undermining. Nonviable subcutaneous tissue and skin taken from the superior circumference of the wound is slough from the surface. READMISSION 03/06/16 since I last saw this patient at the end of June, he went for surgery on 01/11/16 by Dr. Doran Durand of orthopedics. He had a left foot irrigation and debridement, removal of hardware and placement of wound VAC. He is also been followed by Dr. Megan Salon of infectious disease and completed a six-week course of IV Rocephin for group B strep and Enterobacter in the bone at the time of surgery. Apparently at the time of surgery the bone looked healthy so I don't think any bone was actually removed. He has been using silver alginate based dressings on the same wound area at the base of the left fifth metatarsal on the left. I note that he is also had arterial studies on 01/08/16, these showed a left ABI of 1.2 to and a right ABI of 1.3. Waveforms were listed as biphasic. He was not felt to have any specific arterial issues. 03/13/16; no real change in the condition of the wound at the left lateral foot at roughly the base of his fifth metatarsal. Use silver alginate last week. 03/18/16 arrives today with no open area. Being suspicious of the  overlying callus I. Some of this back although I see nothing but covering tissue here/epithelium. There is no surrounding tenderness READMISSION 04/16/16 this is  a patient I discharged about a month ago. Initially a surgical wound on the lateral aspect of his left foot which subsequently became infected. The story he is giving today that he went back into his own modified shoe started to notice pain 2 weeks ago he was seen in Dr. Nona Dell office by a physician assistant last Wednesday and according the patient was told that everything looked fine however this is clearly now broken open and he has an open wound in the same spot that we have been dealing with repetitively. Situation is complicated by the fact that he is running short of money on long-term disability. He has not taken his insulin and at least 2 weeks was previously on NovoLog short acting insulin on a sliding scale and TRESIBA 35 units at bedtime. He is no longer able to afford any of his medication he was in the x-ray on 10/15. A plain x-ray showed healed fracture of the left fifth metatarsal bone status post removal of the associated plate and screw fixation hardware. There was no acute appearing osseous abnormality. His blood work showed a white count of 9.2 with an essentially normal differential comprehensive metabolic panel was normal. Previous CT scan of the foot on 01/08/16 showed no osteomyelitis previous vascular workup showed no evidence of significant PAD on 01/13/16 04/23/16; culture I did last week grew enterococcus [ampicillin sensitive] and MRSA. He saw infectious disease yesterday. They stop the clindamycin and ordered an MRI. This is not unreasonable. All the hardware is out of the foot at this point. 04/30/16 at this point in time we are still awaiting the results of the MRI at this point in time. Patient did have an area which appears to be somewhat macerated in the proximal portion of the wound where there is overlying necrotic skin that is doing nothing more than trapping fluid underneath. He continues to state that he does have some discomfort and again the concern is for  the possibility of osteomyelitis hence the reason for the MRI order. We have been using a silver alginate dressing but again I think the reason this with his macerated as it was is that the dressing obscene could not reach the entirety of the wound due to some necrotic skin covering the proximal portion. No bleeding noted at this point in time on initial evaluation. 05/07/16 we receive the results of patient's MRI which shows that he has no evidence of osteomyelitis. This obviously is excellent news. Patient was definitely happy to hear this. He tells me the wound appears to be doing very well at this point in time and is pleased with the progress currently. [name], [name] ([id number]) 05/15/16; as noted the patient did not have osteomyelitis. He has been released by infectious disease and orthopedics. His wound is still open he had a debridement last week but complain when he got home he "bleeding for half a day". He is not had any pain. We have been using silver alginate with Kerlix gauze wrap. 05/28/16; patient arrives today with the wound in much the same condition as last time. He has a small opening on the lateral aspect of his foot however with debridement there is clear undermining medially there is no real evidence of infection here and I didn't see any point in culturing this. One would have to wonder if this isn't a  simple matter of in adequate offloading as he is been using a healing sandal. 06/05/16; the open area is now on the plantar aspect of his foot and not decide. The wound almost appears to have "migrated". This was the term use by our intake nurse. 06/12/16; open area on the plantar aspect of his foot. Base of the wound looks very healthy. This will be his second week in a total contact cast 06/19/16; patient arrived today in a total contact cast. There was some expectation from our staff and myself that this area would be healed. Unfortunately the area was boggy and with rec  pressure a fairly substantial amount of purulent drainage was obtained. Specimen obtained for culture. The patient had no complaints of systemic problems including fever or chills or instability of his diabetes. There was no pain in the foot. Nevertheless a extensive debridement was required. 06/26/16; patient's culture from the abscess last week grew a combination of MRSA and ampicillin sensitive enterococcus. I had him on Augmentin and Septra however I have elected to give him a full 10 day course of Zyvox instead as I Recently treated this combination of organisms with Augmentin and Septra before. Arrives today with no systemic symptoms 07/03/16; the patient has 2 more treatments of Zyvox and then he is finished antibiotics the wound has improved now mostly on the lateral aspect of his foot. There is still some tenderness when he walks. Electronic Signature(s) Signed: 07/03/2016 5:37:12 PM By: [name] MD Entered By: [name] on 07/03/2016 14:27:21 [name] ([id number]) -------------------------------------------------------------------------------- Physical Exam Details Patient Name: [name], [name]. Date of Service: 07/03/2016 1:30 PM Medical Record Patient Account Number: [id number] Number: Treating RN: Baruch Gouty, RN, BSN, Rita 10-17-1960 (56 y.o. Other Clinician: Date of Birth/Sex: Male) Treating Demetria Lightsey Primary Care Physician/Extender: Ali Lowe, ADAKU Physician: Referring Physician: Gavin Pound Weeks in Treatment: 17 Notes Wound exam; the wound looks better than last week and is now located on the lateral aspect of the foot roughly at the base of the fifth metatarsal. There is tenderness on the inferior aspect of his foot but no open area. Using a #3 curet again I have debrided nonviable subcutaneous tissue from the surface of the remaining wound on the lateral aspect of the foot. There is no purulence and no obvious erythema  or crepitus. Electronic Signature(s) Signed: 07/03/2016 5:37:12 PM By: [name] MD Entered By: [name] on 07/03/2016 14:28:56 [name] ([id number]) -------------------------------------------------------------------------------- Physician Orders Details Patient Name: [name], [name]. Date of Service: 07/03/2016 1:30 PM Medical Record Patient Account Number: [id number] Number: Treating RN: Baruch Gouty, RN, BSN, Rita 1960-10-19 (56 y.o. Other Clinician: Date of Birth/Sex: Male) Treating Kamisha Ell Primary Care Physician/Extender: Ali Lowe, ADAKU Physician: Referring Physician: Judeth Horn in Treatment: 54 Verbal / Phone Orders: Yes Clinician: Afful, RN, BSN, Rita Read Back and Verified: Yes Diagnosis Coding Wound Cleansing Wound #6 Left,Lateral Foot o Clean wound with Normal Saline. Anesthetic Wound #6 Left,Lateral Foot o Topical Lidocaine 4% cream applied to wound bed prior to debridement Primary Wound Dressing Wound #6 Left,Lateral Foot o Aquacel Ag Secondary Dressing Wound #6 Left,Lateral Foot o Gauze and Kerlix/Conform Dressing Change Frequency Wound #6 Left,Lateral Foot o Change dressing every day. Follow-up Appointments Wound #6 Left,Lateral Foot o Return Appointment in 1 week. Off-Loading Wound #6 Left,Lateral Foot o Open toe surgical shoe with peg assist. Additional Orders / Instructions Wound #6 Left,Lateral Foot o Increase protein intake. o Activity as tolerated [name], [name] F. (  [id number]) Medications-please add to medication list. Wound #6 Left,Lateral Foot o P.O. Antibiotics - zyvox started 06/25/16 Electronic Signature(s) Signed: 07/03/2016 4:04:09 PM By: Regan Lemming BSN, RN Signed: 07/03/2016 5:37:12 PM By: [name] MD Entered By: Regan Lemming on 07/03/2016 14:04:38 [name] ([id number]) -------------------------------------------------------------------------------- Problem List  Details Patient Name: [name], [name]. Date of Service: 07/03/2016 1:30 PM Medical Record Patient Account Number: [id number] Number: Treating RN: Baruch Gouty, RN, BSN, Rita 1961/04/08 (56 y.o. Other Clinician: Date of Birth/Sex: Male) Treating Decari Duggar Primary Care Physician/Extender: Ali Lowe, Doreene Burke Physician: Referring Physician: Judeth Horn in Treatment: 17 Active Problems ICD-10 Encounter Code Description Active Date Diagnosis E11.621 Type 2 diabetes mellitus with foot ulcer 03/06/2016 Yes L97.523 Non-pressure chronic ulcer of other part of left foot with 03/06/2016 Yes necrosis of muscle Inactive Problems ICD-10 Code Description Active Date Inactive Date M86.672 Other chronic osteomyelitis, left ankle and foot 03/06/2016 03/06/2016 Resolved Problems Electronic Signature(s) Signed: 07/03/2016 5:37:12 PM By: [name] MD Entered By: [name] on 07/03/2016 14:25:23 [name] ([id number]) -------------------------------------------------------------------------------- Progress Note Details Patient Name: [name]. Date of Service: 07/03/2016 1:30 PM Medical Record Patient Account Number: [id number] Number: Treating RN: Baruch Gouty, RN, BSN, Rita 06-27-1961 (56 y.o. Other Clinician: Date of Birth/Sex: Male) Treating Jamelyn Bovard Primary Care Physician/Extender: Garlan Fair Physician: Referring Physician: Judeth Horn in Treatment: 17 Subjective Chief Complaint Information obtained from Patient 03/06/16; patient returns today after further surgery on the left foot as well as a 6 week course of IV antibiotics History of Present Illness (HPI) Pleasant 56 year old with history of diabetes (Hgb A1c 10.8 in 2014) and peripheral neuropathy. No PVD. L ABI 1.1. Status post right great toe partial amputation years ago. He was at work and on 10/22/2014, was injured by a cart, and suffered an ulceration to his left anterior calf. He  says that it subsequently became infected, and he was treated with a course of antibiotics. He was found on initial exam to have an ulceration on the dorsum of his left third toe. He was unaware of this and attributes it to pressure from his steel toed boots. More recently he injured his right anterior calf on a cart. Ambulating normally per his baseline. He has been undergoing regular debridements, applying mupirocin cream, and an Ace wrap for edema control. He returns to clinic for follow-up and is without complaints. No pain. No fever or chills. No drainage. 10/25/15; this is a 56 year old man who has type II diabetes with diabetic polyneuropathy. He tells me that he fractured his left fifth metatarsal in June 2016 when he presented with swelling. He does not recall a specific injury. His hemoglobin A1c was apparently too high at the time for consideration of surgery and he was put in some form of offloading. Ultimately he went to surgery in December with an allograft from his calcaneus to this site, plate and screws. He had an x-ray of the foot in March that showed concerns about nonunion. He tells me that in March he had to move and basically moved himself. He was on his foot a lot and then noticed some drainage from an open area. He has been following with his orthopedic surgeon Dr. Doran Durand. He has been applying a felt donut, dry dressing and using his heel healing sandal. 11/01/15; this is a patient I saw last week for the first time. He had a small open wound on the plantar aspect of his left foot at roughly the level  of the base of his fifth metatarsal. He had a considerable degree of thickened skin around this wound on the plantar aspect which I thought was from chronic pressure on this area. He tells [place] that he had drainage over the course of the week. No systemic symptoms. 11/08/15; culture last week grew Citrobacter korseri. This should've been sensitive to the Augmentin I gave him. He  has seen Dr. Doran Durand who did his initial surgery and according the patient the plan is to give this another month and then the hardware might need to come out of this. This seems like a reasonable plan. I will adjust his antibiotics to ciprofloxacin which probably should continue for at least another 2 weeks. I gave him 10 days worth today 11/22/15 the patient has completed antibiotics. He has an appointment with Dr. Doran Durand this Friday. There is CARLIS, KIPPS ([id number]) improved dimensions around the wound on the left fifth metatarsal base 11/29/15; the patient has completed antibiotics last week. Apparently his appointment with Dr. Doran Durand it is not until this Friday. Dimensions are roughly the same. 12/06/15; saw Dr. Doran Durand. No x-ray told the end of the month, next appointment June 30. We have been using Aquacel Ag 12/13/15: No major change this week. Using Aquacel AG 621/17; arrived this week with maceration around the wound. There was quite a bit of undermining which required surgical debridement. I changed him to [facility] last week, by the patient's admission he was up on this more this week 12/27/15; macerated tissue around the wound is removed with a scalpel and pickups. There is no undermining. Nonviable subcutaneous tissue and skin taken from the superior circumference of the wound is slough from the surface. READMISSION 03/06/16 since I last saw this patient at the end of June, he went for surgery on 01/11/16 by Dr. Doran Durand of orthopedics. He had a left foot irrigation and debridement, removal of hardware and placement of wound VAC. He is also been followed by Dr. Megan Salon of infectious disease and completed a six-week course of IV Rocephin for group B strep and Enterobacter in the bone at the time of surgery. Apparently at the time of surgery the bone looked healthy so I don't think any bone was actually removed. He has been using silver alginate based dressings on the same wound  area at the base of the left fifth metatarsal on the left. I note that he is also had arterial studies on 01/08/16, these showed a left ABI of 1.2 to and a right ABI of 1.3. Waveforms were listed as biphasic. He was not felt to have any specific arterial issues. 03/13/16; no real change in the condition of the wound at the left lateral foot at roughly the base of his fifth metatarsal. Use silver alginate last week. 03/18/16 arrives today with no open area. Being suspicious of the overlying callus I. Some of this back although I see nothing but covering tissue here/epithelium. There is no surrounding tenderness READMISSION 04/16/16 this is a patient I discharged about a month ago. Initially a surgical wound on the lateral aspect of his left foot which subsequently became infected. The story he is giving today that he went back into his own modified shoe started to notice pain 2 weeks ago he was seen in Dr. Nona Dell office by a physician assistant last Wednesday and according the patient was told that everything looked fine however this is clearly now broken open and he has an open wound in the same spot that we  have been dealing with repetitively. Situation is complicated by the fact that he is running short of money on long-term disability. He has not taken his insulin and at least 2 weeks was previously on NovoLog short acting insulin on a sliding scale and TRESIBA 35 units at bedtime. He is no longer able to afford any of his medication he was in the x-ray on 10/15. A plain x-ray showed healed fracture of the left fifth metatarsal bone status post removal of the associated plate and screw fixation hardware. There was no acute appearing osseous abnormality. His blood work showed a white count of 9.2 with an essentially normal differential comprehensive metabolic panel was normal. Previous CT scan of the foot on 01/08/16 showed no osteomyelitis previous vascular workup showed no evidence of  significant PAD on 01/13/16 04/23/16; culture I did last week grew enterococcus [ampicillin sensitive] and MRSA. He saw infectious disease yesterday. They stop the clindamycin and ordered an MRI. This is not unreasonable. All the hardware is out of the foot at this point. 04/30/16 at this point in time we are still awaiting the results of the MRI at this point in time. Patient did have an area which appears to be somewhat macerated in the proximal portion of the wound where there is overlying necrotic skin that is doing nothing more than trapping fluid underneath. He continues to state that he does have some discomfort and again the concern is for the possibility of osteomyelitis hence the reason [name], [name]. ([id number]) for the MRI order. We have been using a silver alginate dressing but again I think the reason this with his macerated as it was is that the dressing obscene could not reach the entirety of the wound due to some necrotic skin covering the proximal portion. No bleeding noted at this point in time on initial evaluation. 05/07/16 we receive the results of patient's MRI which shows that he has no evidence of osteomyelitis. This obviously is excellent news. Patient was definitely happy to hear this. He tells me the wound appears to be doing very well at this point in time and is pleased with the progress currently. 05/15/16; as noted the patient did not have osteomyelitis. He has been released by infectious disease and orthopedics. His wound is still open he had a debridement last week but complain when he got home he "bleeding for half a day". He is not had any pain. We have been using silver alginate with Kerlix gauze wrap. 05/28/16; patient arrives today with the wound in much the same condition as last time. He has a small opening on the lateral aspect of his foot however with debridement there is clear undermining medially there is no real evidence of infection here and I didn't  see any point in culturing this. One would have to wonder if this isn't a simple matter of in adequate offloading as he is been using a healing sandal. 06/05/16; the open area is now on the plantar aspect of his foot and not decide. The wound almost appears to have "migrated". This was the term use by our intake nurse. 06/12/16; open area on the plantar aspect of his foot. Base of the wound looks very healthy. This will be his second week in a total contact cast 06/19/16; patient arrived today in a total contact cast. There was some expectation from our staff and myself that this area would be healed. Unfortunately the area was boggy and with rec pressure a fairly substantial amount of  purulent drainage was obtained. Specimen obtained for culture. The patient had no complaints of systemic problems including fever or chills or instability of his diabetes. There was no pain in the foot. Nevertheless a extensive debridement was required. 06/26/16; patient's culture from the abscess last week grew a combination of MRSA and ampicillin sensitive enterococcus. I had him on Augmentin and Septra however I have elected to give him a full 10 day course of Zyvox instead as I Recently treated this combination of organisms with Augmentin and Septra before. Arrives today with no systemic symptoms 07/03/16; the patient has 2 more treatments of Zyvox and then he is finished antibiotics the wound has improved now mostly on the lateral aspect of his foot. There is still some tenderness when he walks. Objective Constitutional Vitals Time Taken: 1:42 PM, Height: 70 in, Weight: 260 lbs, BMI: 37.3. Integumentary (Hair, Skin) Wound #6 status is Open. Original cause of wound was Gradually Appeared. The wound is located on the Left,Lateral Foot. The wound measures 1cm length x 0.9cm width x 0.3cm depth; 0.707cm^2 area and 0.212cm^3 volume. The wound is limited to skin breakdown. There is no tunneling or undermining  noted. There is a large amount of serosanguineous drainage noted. The wound margin is thickened. There is large (67-100%) pink, pale granulation within the wound bed. There is a small (1-33%) amount of necrotic tissue within the wound bed including Adherent Slough. The periwound skin appearance exhibited: Callus, Moist. The periwound skin appearance did not exhibit: Crepitus, Excoriation, Fluctuance, Friable, Induration, Localized Edema, Rash, Scarring, Dry/Scaly, Maceration, Atrophie Blanche, Cyanosis, Ecchymosis, Hemosiderin Staining, Mottled, Pallor, Rubor, Erythema. Periwound temperature was noted as No [name], [name]. ([id number]) Abnormality. 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 There was a Skin/Subcutaneous Tissue Debridement [id number]) debridement with total area of 0.9 sq cm performed by Ricard Dillon, MD. with the following instrument(s): Curette to remove Non-Viable tissue/material including Fibrin/Slough and Subcutaneous after achieving pain control using Lidocaine 4% Topical Solution. A time out was conducted at 14:01, prior to the start of the procedure. A Minimum amount of bleeding was controlled with Pressure. The procedure was tolerated well with a pain level of 0 throughout and a pain level of 0 following the procedure. Post Debridement Measurements: 1cm length x 0.9cm width x 0.3cm depth; 0.212cm^3 volume. Character of Wound/Ulcer Post Debridement is stable. Severity of Tissue Post Debridement is: Fat layer exposed. Post procedure Diagnosis Wound #6: Same as Pre-Procedure Plan Wound Cleansing: Wound #6 Left,Lateral Foot: Clean wound with Normal Saline. Anesthetic: Wound #6 Left,Lateral Foot: Topical Lidocaine 4%  cream applied to wound bed prior to debridement Primary Wound Dressing: Wound #6 Left,Lateral Foot: Aquacel Ag [name] ([id number]) Secondary Dressing: Wound #6 Left,Lateral Foot: Gauze and Kerlix/Conform Dressing Change Frequency: Wound #6 Left,Lateral Foot: Change dressing every day. Follow-up Appointments: Wound #6 Left,Lateral Foot: Return Appointment in 1 week. Off-Loading: Wound #6 Left,Lateral Foot: Open toe surgical shoe with peg assist. Additional Orders / Instructions: Wound #6 Left,Lateral Foot: Increase protein intake. Activity as tolerated Medications-please add to medication list.: Wound #6 Left,Lateral Foot: P.O. Antibiotics - zyvox started 06/25/16 #1we will continue with silver algiinate based dressings #2complete zyvox #3 continue healing sanda #4 His pain is concerning, This could be pressure on this area when  he walks, residula pain from the original fracture however infection is a Social research officer, government) Signed: 07/03/2016 5:37:12 PM By: [name] MD Entered By: [name] on 07/03/2016 14:31:35 [name] ([id number]) -------------------------------------------------------------------------------- SuperBill Details Patient Name: [name]. Date of Service: 07/03/2016 Medical Record Patient Account Number: [id number] Number: Treating RN: Baruch Gouty, RN, BSN, Rita [birth year] (56 y.o. Other Clinician: Date of Birth/Sex: Male) Treating Razia Screws, [place] Primary Care Physician: Gavin Pound Physician/Extender: G Referring Physician: Judeth Horn in Treatment: 17 Diagnosis Coding ICD-10 Codes Code Description E11.621 Type 2 diabetes mellitus with foot ulcer L97.523 Non-pressure chronic ulcer of other part of left foot with necrosis of muscle Facility Procedures CPT4 Code Description: [id number] - DEB SUBQ TISSUE 20 SQ CM/< ICD-10 Description Diagnosis E11.621 Type 2 diabetes mellitus with foot ulcer  L97.523 Non-pressure chronic ulcer of other part of left foot Modifier: with necrosi Quantity: 1 s of muscle Physician Procedures CPT4 Code Description: [id number] - WC PHYS SUBQ TISS 20 SQ CM ICD-10 Description Diagnosis E11.621 Type 2 diabetes mellitus with foot ulcer L97.523 Non-pressure chronic ulcer of other part of left foot Modifier: with necrosis Quantity: 1 of muscle Electronic Signature(s) Signed: 07/03/2016 5:37:12 PM By: [name] MD Entered By: [name] on 07/03/2016 14:32:00

## 2016-07-10 ENCOUNTER — Encounter: Payer: Commercial Managed Care - PPO | Admitting: Internal Medicine

## 2016-07-10 DIAGNOSIS — E11621 Type 2 diabetes mellitus with foot ulcer: Secondary | ICD-10-CM | POA: Diagnosis not present

## 2016-07-10 NOTE — Progress Notes (Addendum)
GARSON, DEGNAN ([id number]) Visit Report for 07/10/2016 Arrival Information Details Patient Name: [name], [name]. Date of Service: 07/10/2016 1:30 PM Medical Record Patient Account Number: [id number] Number: Treating RN: Baruch Gouty, RN, BSN, Rita 11-08-1960 (56 y.o. Other Clinician: Date of Birth/Sex: Male) Treating ROBSON, MICHAEL Primary Care Physician: NNODI, ADAKU Physician/Extender: G Referring Physician: Judeth Horn in Treatment: 18 Visit Information History Since Last Visit All ordered tests and consults were completed: No Patient Arrived: Ambulatory Added or deleted any medications: No Arrival Time: 13:32 Any new allergies or adverse reactions: No Accompanied By: self Had a fall or experienced change in No Transfer Assistance: None activities of daily living that may affect Patient Identification Verified: Yes risk of falls: Secondary Verification Process Yes Signs or symptoms of abuse/neglect since last No Completed: visito Patient Requires Transmission-Based No Hospitalized since last visit: No Precautions: Has Dressing in Place as Prescribed: Yes Patient Has Alerts: Yes Pain Present Now: No Patient Alerts: DM II Electronic Signature(s) Signed: 07/10/2016 1:32:39 PM By: Regan Lemming BSN, RN Entered By: Regan Lemming on 07/10/2016 13:32:38 [name] ([id number]) -------------------------------------------------------------------------------- Encounter Discharge Information Details Patient Name: [name], [name]. Date of Service: 07/10/2016 1:30 PM Medical Record Patient Account Number: [id number] Number: Treating RN: Baruch Gouty, RN, BSN, Rita 1960/09/06 (56 y.o. Other Clinician: Date of Birth/Sex: Male) Treating ROBSON, MICHAEL Primary Care Physician: Gavin Pound Physician/Extender: G Referring Physician: Judeth Horn in Treatment: 18 Encounter Discharge Information Items Schedule Follow-up Appointment: No Medication  Reconciliation completed No and provided to Patient/Care Makalya Nave: Provided on Clinical Summary of Care: 07/10/2016 Form Type Recipient Paper Patient HP Electronic Signature(s) Signed: 07/10/2016 2:18:47 PM By: Ruthine Dose Entered By: Ruthine Dose on 07/10/2016 14:18:47 [name] ([id number]) -------------------------------------------------------------------------------- Lower Extremity Assessment Details Patient Name: [name]. Date of Service: 07/10/2016 1:30 PM Medical Record Patient Account Number: [id number] Number: Treating RN: Baruch Gouty, RN, BSN, Rita [birth year] (56 y.o. Other Clinician: Date of Birth/Sex: Male) Treating ROBSON, MICHAEL Primary Care Physician: Gavin Pound Physician/Extender: G Referring Physician: Judeth Horn in Treatment: 18 Vascular Assessment Claudication: Claudication Assessment [Left:None] Pulses: Dorsalis Pedis Palpable: [Left:Yes] Posterior Tibial Extremity colors, hair growth, and conditions: Extremity Color: [Left:Dusky] Hair Growth on Extremity: [Left:No] Temperature of Extremity: [Left:Warm] Capillary Refill: [Left:< 3 seconds] Toe Nail Assessment Left: Right: Thick: Yes Discolored: Yes Deformed: No Improper Length and Hygiene: Yes Electronic Signature(s) Signed: 07/10/2016 1:33:12 PM By: Regan Lemming BSN, RN Entered By: Regan Lemming on 07/10/2016 13:33:12 [name] ([id number]) -------------------------------------------------------------------------------- Multi Wound Chart Details Patient Name: [name]. Date of Service: 07/10/2016 1:30 PM Medical Record Patient Account Number: [id number] Number: Treating RN: Baruch Gouty, RN, BSN, Rita [birth year] (56 y.o. Other Clinician: Date of Birth/Sex: Male) Treating ROBSON, MICHAEL Primary Care Physician: NNODI, Doreene Burke Physician/Extender: G Referring Physician: Gavin Pound Weeks in Treatment: 18 Vital Signs Height(in): 70 Pulse(bpm):  78 Weight(lbs): 260 Blood Pressure 131/67 (mmHg): Body Mass Index(BMI): 37 Temperature(F): 98 Respiratory Rate 16 (breaths/min): Photos: [6:No Photos] [N/A:N/A] Wound Location: [6:Left Foot - Lateral] [N/A:N/A] Wounding Event: [6:Gradually Appeared] [N/A:N/A] Primary Etiology: [6:Diabetic Wound/Ulcer of the Lower Extremity] [N/A:N/A] Comorbid History: [6:Hypertension, Type II Diabetes, Neuropathy] [N/A:N/A] Date Acquired: [6:03/25/2016] [N/A:N/A] Weeks of Treatment: [6:12] [N/A:N/A] Wound Status: [6:Open] [N/A:N/A] Measurements L x W x D 1x0.3x0.3 [N/A:N/A] (cm) Area (cm) : [6:0.236] [N/A:N/A] Volume (cm) : [6:0.071] [N/A:N/A] % Reduction in Area: [6:95.00%] [N/A:N/A] % Reduction in Volume: 92.50% [N/A:N/A] Classification: [6:Grade 1] [N/A:N/A] Exudate Amount: [6:Medium] [N/A:N/A] Exudate Type: [6:Serosanguineous] [N/A:N/A] Exudate Color: [6:red,  brown] [N/A:N/A] Wound Margin: [6:Thickened] [N/A:N/A] Granulation Amount: [6:Large (67-100%)] [N/A:N/A] Granulation Quality: [6:Pink, Pale] [N/A:N/A] Necrotic Amount: [6:Small (1-33%)] [N/A:N/A] Exposed Structures: [6:Fascia: No Fat: No Tendon: No Muscle: No] [N/A:N/A] Joint: No Bone: No Limited to Skin Breakdown Epithelialization: Small (1-33%) N/A N/A Debridement: Debridement [id number] N/A N/A 11047) Pre-procedure 14:06 N/A N/A Verification/Time Out Taken: Pain Control: Lidocaine 4% Topical N/A N/A Solution Tissue Debrided: Fibrin/Slough, Callus, N/A N/A Subcutaneous Level: Skin/Subcutaneous N/A N/A Tissue Debridement Area (sq 0.3 N/A N/A cm): Instrument: Curette N/A N/A Bleeding: Minimum N/A N/A Hemostasis Achieved: Pressure N/A N/A Procedural Pain: 1 N/A N/A Post Procedural Pain: 0 N/A N/A Debridement Treatment Procedure was tolerated N/A N/A Response: well Post Debridement 1x0.4x0.3 N/A N/A Measurements L x W x D (cm) Post Debridement 0.094 N/A N/A Volume: (cm) Periwound Skin Texture: Callus: Yes N/A  N/A Edema: No Excoriation: No Induration: No Crepitus: No Fluctuance: No Friable: No Rash: No Scarring: No Periwound Skin Moist: Yes N/A N/A Moisture: Maceration: No Dry/Scaly: No Periwound Skin Color: Atrophie Blanche: No N/A N/A Cyanosis: No Ecchymosis: No Erythema: No Hemosiderin Staining: No Mottled: No Pallor: No Rubor: No [name], STEVEN. ([id number]) Temperature: No Abnormality N/A N/A Tenderness on No N/A N/A Palpation: Wound Preparation: Ulcer Cleansing: N/A N/A Rinsed/Irrigated with Saline Topical Anesthetic Applied: Other: Lidocaine 4% Procedures Performed: Debridement N/A N/A Treatment Notes Electronic Signature(s) Signed: 07/10/2016 6:05:53 PM By: [name] MD Entered By: [name] on 07/10/2016 14:26:41 [name] ([id number]) -------------------------------------------------------------------------------- [place] Details Patient Name: [name], SAWADA. Date of Service: 07/10/2016 1:30 PM Medical Record Patient Account Number: [id number] Number: Treating RN: Baruch Gouty, RN, BSN, Rita [birth year] (56 y.o. Other Clinician: Date of Birth/Sex: Male) Treating ROBSON, [place] Primary Care Physician: Gavin Pound Physician/Extender: G Referring Physician: Judeth Horn in Treatment: 15 Active Inactive Orientation to the Wound Care Program Nursing Diagnoses: Knowledge deficit related to the wound healing center program Goals: Patient/caregiver will verbalize understanding of the [place] Program Date Initiated: 04/16/2016 Goal Status: Active Interventions: Provide education on orientation to the wound center Notes: Wound/Skin Impairment Nursing Diagnoses: Impaired tissue integrity Knowledge deficit related to ulceration/compromised skin integrity Goals: Patient/caregiver will verbalize understanding of skin care regimen Date Initiated: 04/16/2016 Goal Status: Active Ulcer/skin breakdown  will have a volume reduction of 30% by week 4 Date Initiated: 04/16/2016 Goal Status: Active Ulcer/skin breakdown will have a volume reduction of 50% by week 8 Date Initiated: 04/16/2016 Goal Status: Active Ulcer/skin breakdown will have a volume reduction of 80% by week 12 Date Initiated: 04/16/2016 Goal Status: Active Ulcer/skin breakdown will heal within 14 weeks [name], [name] ([id number]) Date Initiated: 04/16/2016 Goal Status: Active Interventions: Assess patient/caregiver ability to obtain necessary supplies Assess patient/caregiver ability to perform ulcer/skin care regimen upon admission and as needed Assess ulceration(s) every visit Provide education on ulcer and skin care Treatment Activities: Skin care regimen initiated : 03/06/2016 Topical wound management initiated : 03/06/2016 Notes: Electronic Signature(s) Signed: 07/10/2016 4:54:43 PM By: Regan Lemming BSN, RN Entered By: Regan Lemming on 07/10/2016 14:08:26 [name] ([id number]) -------------------------------------------------------------------------------- Pain Assessment Details Patient Name: [name]. Date of Service: 07/10/2016 1:30 PM Medical Record Patient Account Number: [id number] Number: Treating RN: Baruch Gouty, RN, BSN, Rita Feb 12, 1961 (56 y.o. Other Clinician: Date of Birth/Sex: Male) Treating ROBSON, MICHAEL Primary Care Physician: Gavin Pound Physician/Extender: G Referring Physician: Judeth Horn in Treatment: 18 Active Problems Location of Pain Severity and Description of Pain Patient Has Paino No Site Locations With Dressing  Change: No Pain Management and Medication Current Pain Management: Electronic Signature(s) Signed: 07/10/2016 1:33:27 PM By: Regan Lemming BSN, RN Entered By: Regan Lemming on 07/10/2016 13:33:27 [name] ([id number]) -------------------------------------------------------------------------------- Wound Assessment Details Patient Name:  [name]. Date of Service: 07/10/2016 1:30 PM Medical Record Patient Account Number: [id number] Number: Treating RN: Baruch Gouty, RN, BSN, Rita 09/20/1960 (56 y.o. Other Clinician: Date of Birth/Sex: Male) Treating ROBSON, [place] Primary Care Physician: Gavin Pound Physician/Extender: G Referring Physician: Gavin Pound Weeks in Treatment: 18 Wound Status Wound Number: 6 Primary Diabetic Wound/Ulcer of the Lower Etiology: Extremity Wound Location: Left Foot - Lateral Wound Status: Open Wounding Event: Gradually Appeared Comorbid Hypertension, Type II Diabetes, Date Acquired: 03/25/2016 History: Neuropathy Weeks Of Treatment: 12 Clustered Wound: No Photos Photo Uploaded By: Regan Lemming on 07/10/2016 17:12:49 Wound Measurements Length: (cm) 1 Width: (cm) 0.3 Depth: (cm) 0.3 Area: (cm) 0.236 Volume: (cm) 0.071 % Reduction in Area: 95% % Reduction in Volume: 92.5% Epithelialization: Small (1-33%) Tunneling: No Undermining: No Wound Description Classification: Grade 1 Foul Odor Aft Wound Margin: Thickened Exudate Amount: Medium Exudate Type: Serosanguineous Exudate Color: red, brown er Cleansing: No Wound Bed Granulation Amount: Large (67-100%) Exposed Structure Granulation Quality: Pink, Pale Fascia Exposed: No Necrotic Amount: Small (1-33%) Fat Layer Exposed: No [name], [name] ([id number]) Necrotic Quality: Adherent Slough Tendon Exposed: No Muscle Exposed: No Joint Exposed: No Bone Exposed: No Limited to Skin Breakdown Periwound Skin Texture Texture Color No Abnormalities Noted: No No Abnormalities Noted: No Callus: Yes Atrophie Blanche: No Crepitus: No Cyanosis: No Excoriation: No Ecchymosis: No Fluctuance: No Erythema: No Friable: No Hemosiderin Staining: No Induration: No Mottled: No Localized Edema: No Pallor: No Rash: No Rubor: No Scarring: No Temperature / Pain Moisture Temperature: No Abnormality No Abnormalities  Noted: No Dry / Scaly: No Maceration: No Moist: Yes Wound Preparation Ulcer Cleansing: Rinsed/Irrigated with Saline Topical Anesthetic Applied: Other: Lidocaine 4%, Electronic Signature(s) Signed: 07/10/2016 4:54:43 PM By: Regan Lemming BSN, RN Entered By: Regan Lemming on 07/10/2016 13:43:58 [name] ([id number]) -------------------------------------------------------------------------------- Vitals Details Patient Name: [name]. Date of Service: 07/10/2016 1:30 PM Medical Record Patient Account Number: [id number] Number: Treating RN: Baruch Gouty, RN, BSN, Rita [birth year] (56 y.o. Other Clinician: Date of Birth/Sex: Male) Treating ROBSON, [place] Primary Care Physician: NNODI, ADAKU Physician/Extender: G Referring Physician: Gavin Pound Weeks in Treatment: 18 Vital Signs Time Taken: 13:44 Temperature (F): 98 Height (in): 70 Pulse (bpm): 78 Weight (lbs): 260 Respiratory Rate (breaths/min): 16 Body Mass Index (BMI): 37.3 Blood Pressure (mmHg): 131/67 Reference Range: 80 - 120 mg / dl Electronic Signature(s) Signed: 07/10/2016 4:54:43 PM By: Regan Lemming BSN, RN Entered By: Regan Lemming on 07/10/2016 13:46:13

## 2016-07-11 NOTE — Progress Notes (Signed)
TRINITY, [name] ([id number]) Visit Report for 07/10/2016 Chief Complaint Document Details Patient Name: [name], [name]. Date of Service: 07/10/2016 1:30 PM Medical Record Patient Account Number: [id number] Number: Treating RN: Baruch Gouty, RN, BSN, Rita [birth year] (56 y.o. Other Clinician: Date of Birth/Sex: Male) Treating Ashur Glatfelter Primary Care Physician/Extender: Garlan Fair Physician: Referring Physician: Judeth Horn in Treatment: 18 Information Obtained from: Patient Chief Complaint 03/06/16; patient returns today after further surgery on the left foot as well as a 6 week course of IV antibiotics Electronic Signature(s) Signed: 07/10/2016 6:05:53 PM By: [name] MD Entered By: [name] on 07/10/2016 14:27:18 [name] ([id number]) -------------------------------------------------------------------------------- Debridement Details Patient Name: [name]. Date of Service: 07/10/2016 1:30 PM Medical Record Patient Account Number: [id number] Number: Treating RN: Baruch Gouty, RN, BSN, Rita 02/03/1961 (56 y.o. Other Clinician: Date of Birth/Sex: Male) Treating Deloise Marchant Primary Care Physician/Extender: Ali Lowe, ADAKU Physician: Referring Physician: Judeth Horn in Treatment: 18 Debridement Performed for Wound #6 Left,Lateral Foot Assessment: Performed By: Physician Ricard Dillon, MD Debridement: Debridement Pre-procedure Yes - 14:06 Verification/Time Out Taken: Start Time: 14:06 Pain Control: Lidocaine 4% Topical Solution Level: Skin/Subcutaneous Tissue Total Area Debrided (L x 1 (cm) x 0.3 (cm) = 0.3 (cm) W): Tissue and other Non-Viable, Callus, Fibrin/Slough, Subcutaneous material debrided: Instrument: Curette Bleeding: Minimum Hemostasis Achieved: Pressure End Time: 14:09 Procedural Pain: 1 Post Procedural Pain: 0 Response to Treatment: Procedure was tolerated well Post Debridement Measurements  of Total Wound Length: (cm) 1 Width: (cm) 0.4 Depth: (cm) 0.3 Volume: (cm) 0.094 Character of Wound/Ulcer Post Requires Further Debridement Debridement: Severity of Tissue Post Debridement: Fat layer exposed Post Procedure Diagnosis Same as Pre-procedure Electronic Signature(s) Signed: 07/10/2016 4:54:43 PM By: Regan Lemming BSN, RN Haxton, [name] ([id number]) Signed: 07/10/2016 6:05:53 PM By: [name] MD Entered By: [name] on 07/10/2016 14:27:00 [name] ([id number]) -------------------------------------------------------------------------------- HPI Details Patient Name: [name], [name]. Date of Service: 07/10/2016 1:30 PM Medical Record Patient Account Number: [id number] Number: Treating RN: Baruch Gouty, RN, BSN, Rita 1961/02/25 (56 y.o. Other Clinician: Date of Birth/Sex: Male) Treating Tyneshia Stivers Primary Care Physician/Extender: Ali Lowe, ADAKU Physician: Referring Physician: Gavin Pound Weeks in Treatment: 18 History of Present Illness HPI Description: Pleasant 57 year old with history of diabetes (Hgb A1c 10.8 in 2014) and peripheral neuropathy. No PVD. L ABI 1.1. Status post right great toe partial amputation years ago. He was at work and on 10/22/2014, was injured by a cart, and suffered an ulceration to his left anterior calf. He says that it subsequently became infected, and he was treated with a course of antibiotics. He was found on initial exam to have an ulceration on the dorsum of his left third toe. He was unaware of this and attributes it to pressure from his steel toed boots. More recently he injured his right anterior calf on a cart. Ambulating normally per his baseline. He has been undergoing regular debridements, applying mupirocin cream, and an Ace wrap for edema control. He returns to clinic for follow-up and is without complaints. No pain. No fever or chills. No drainage. 10/25/15; this is a 56 year old man who has type  II diabetes with diabetic polyneuropathy. He tells me that he fractured his left fifth metatarsal in June 2016 when he presented with swelling. He does not recall a specific injury. His hemoglobin A1c was apparently too high at the time for consideration of surgery and he was put in some form of offloading. Ultimately he went  to surgery in December with an allograft from his calcaneus to this site, plate and screws. He had an x-ray of the foot in March that showed concerns about nonunion. He tells me that in March he had to move and basically moved himself. He was on his foot a lot and then noticed some drainage from an open area. He has been following with his orthopedic surgeon Dr. Doran Durand. He has been applying a felt donut, dry dressing and using his heel healing sandal. 11/01/15; this is a patient I saw last week for the first time. He had a small open wound on the plantar aspect of his left foot at roughly the level of the base of his fifth metatarsal. He had a considerable degree of thickened skin around this wound on the plantar aspect which I thought was from chronic pressure on this area. He tells [place] that he had drainage over the course of the week. No systemic symptoms. 11/08/15; culture last week grew Citrobacter korseri. This should've been sensitive to the Augmentin I gave him. He has seen Dr. Doran Durand who did his initial surgery and according the patient the plan is to give this another month and then the hardware might need to come out of this. This seems like a reasonable plan. I will adjust his antibiotics to ciprofloxacin which probably should continue for at least another 2 weeks. I gave him 10 days worth today 11/22/15 the patient has completed antibiotics. He has an appointment with Dr. Doran Durand this Friday. There is improved dimensions around the wound on the left fifth metatarsal base 11/29/15; the patient has completed antibiotics last week. Apparently his appointment with Dr. Doran Durand  it is not until this Friday. Dimensions are roughly the same. 12/06/15; saw Dr. Doran Durand. No x-ray told the end of the month, next appointment June 30. We have been using Aquacel Ag 12/13/15: No major change this week. Using Aquacel AG JOFIEL, DENTINO. ([id number]) 621/17; arrived this week with maceration around the wound. There was quite a bit of undermining which required surgical debridement. I changed him to [facility] last week, by the patient's admission he was up on this more this week 12/27/15; macerated tissue around the wound is removed with a scalpel and pickups. There is no undermining. Nonviable subcutaneous tissue and skin taken from the superior circumference of the wound is slough from the surface. READMISSION 03/06/16 since I last saw this patient at the end of June, he went for surgery on 01/11/16 by Dr. Doran Durand of orthopedics. He had a left foot irrigation and debridement, removal of hardware and placement of wound VAC. He is also been followed by Dr. Megan Salon of infectious disease and completed a six-week course of IV Rocephin for group B strep and Enterobacter in the bone at the time of surgery. Apparently at the time of surgery the bone looked healthy so I don't think any bone was actually removed. He has been using silver alginate based dressings on the same wound area at the base of the left fifth metatarsal on the left. I note that he is also had arterial studies on 01/08/16, these showed a left ABI of 1.2 to and a right ABI of 1.3. Waveforms were listed as biphasic. He was not felt to have any specific arterial issues. 03/13/16; no real change in the condition of the wound at the left lateral foot at roughly the base of his fifth metatarsal. Use silver alginate last week. 03/18/16 arrives today with no open area. Being  suspicious of the overlying callus I. Some of this back although I see nothing but covering tissue here/epithelium. There is no surrounding  tenderness READMISSION 04/16/16 this is a patient I discharged about a month ago. Initially a surgical wound on the lateral aspect of his left foot which subsequently became infected. The story he is giving today that he went back into his own modified shoe started to notice pain 2 weeks ago he was seen in Dr. Nona Dell office by a physician assistant last Wednesday and according the patient was told that everything looked fine however this is clearly now broken open and he has an open wound in the same spot that we have been dealing with repetitively. Situation is complicated by the fact that he is running short of money on long-term disability. He has not taken his insulin and at least 2 weeks was previously on NovoLog short acting insulin on a sliding scale and TRESIBA 35 units at bedtime. He is no longer able to afford any of his medication he was in the x-ray on 10/15. A plain x-ray showed healed fracture of the left fifth metatarsal bone status post removal of the associated plate and screw fixation hardware. There was no acute appearing osseous abnormality. His blood work showed a white count of 9.2 with an essentially normal differential comprehensive metabolic panel was normal. Previous CT scan of the foot on 01/08/16 showed no osteomyelitis previous vascular workup showed no evidence of significant PAD on 01/13/16 04/23/16; culture I did last week grew enterococcus [ampicillin sensitive] and MRSA. He saw infectious disease yesterday. They stop the clindamycin and ordered an MRI. This is not unreasonable. All the hardware is out of the foot at this point. 04/30/16 at this point in time we are still awaiting the results of the MRI at this point in time. Patient did have an area which appears to be somewhat macerated in the proximal portion of the wound where there is overlying necrotic skin that is doing nothing more than trapping fluid underneath. He continues to state that he does have  some discomfort and again the concern is for the possibility of osteomyelitis hence the reason for the MRI order. We have been using a silver alginate dressing but again I think the reason this with his macerated as it was is that the dressing obscene could not reach the entirety of the wound due to some necrotic skin covering the proximal portion. No bleeding noted at this point in time on initial evaluation. 05/07/16 we receive the results of patient's MRI which shows that he has no evidence of osteomyelitis. This obviously is excellent news. Patient was definitely happy to hear this. He tells me the wound appears to be doing very well at this point in time and is pleased with the progress currently. [name], [name] ([id number]) 05/15/16; as noted the patient did not have osteomyelitis. He has been released by infectious disease and orthopedics. His wound is still open he had a debridement last week but complain when he got home he "bleeding for half a day". He is not had any pain. We have been using silver alginate with Kerlix gauze wrap. 05/28/16; patient arrives today with the wound in much the same condition as last time. He has a small opening on the lateral aspect of his foot however with debridement there is clear undermining medially there is no real evidence of infection here and I didn't see any point in culturing this. One would have to wonder if  this isn't a simple matter of in adequate offloading as he is been using a healing sandal. 06/05/16; the open area is now on the plantar aspect of his foot and not decide. The wound almost appears to have "migrated". This was the term use by our intake nurse. 06/12/16; open area on the plantar aspect of his foot. Base of the wound looks very healthy. This will be his second week in a total contact cast 06/19/16; patient arrived today in a total contact cast. There was some expectation from our staff and myself that this area would be healed.  Unfortunately the area was boggy and with rec pressure a fairly substantial amount of purulent drainage was obtained. Specimen obtained for culture. The patient had no complaints of systemic problems including fever or chills or instability of his diabetes. There was no pain in the foot. Nevertheless a extensive debridement was required. 06/26/16; patient's culture from the abscess last week grew a combination of MRSA and ampicillin sensitive enterococcus. I had him on Augmentin and Septra however I have elected to give him a full 10 day course of Zyvox instead as I Recently treated this combination of organisms with Augmentin and Septra before. Arrives today with no systemic symptoms 07/03/16; the patient has 2 more treatments of Zyvox and then he is finished antibiotics the wound has improved now mostly on the lateral aspect of his foot. There is still some tenderness when he walks. 07/10/16; patient arrives today with Zyvox completed. He only has a small open area remaining. Electronic Signature(s) Signed: 07/10/2016 6:05:53 PM By: [name] MD Entered By: [name] on 07/10/2016 14:27:55 [name] ([id number]) -------------------------------------------------------------------------------- Physical Exam Details Patient Name: [name], [name]. Date of Service: 07/10/2016 1:30 PM Medical Record Patient Account Number: [id number] Number: Treating RN: Baruch Gouty, RN, BSN, Rita 02-12-1961 (56 y.o. Other Clinician: Date of Birth/Sex: Male) Treating Kebra Lowrimore Primary Care Physician/Extender: Ali Lowe, ADAKU Physician: Referring Physician: Gavin Pound Weeks in Treatment: 18 Constitutional Sitting or standing Blood Pressure is within target range for patient.. Pulse regular and within target range for patient.Marland Kitchen Respirations regular, non-labored and within target range.. Temperature is normal and within the target range for the patient.. Patient's appearance is neat  and clean. Appears in no acute distress. Well nourished and well developed.. Notes Wound exam; the wound continues to look as though it's closing over although he has thick subcutaneous tissue and some callus around this central area. Around the remnants of the wound I used a #3 curet to remove thick callus subcutaneous tissue. The wound looks stable smaller. There is still some tenderness on the plantar aspect of his foot that leads me to some degree of concern Electronic Signature(s) Signed: 07/10/2016 6:05:53 PM By: [name] MD Entered By: [name] on 07/10/2016 14:29:35 [name] ([id number]) -------------------------------------------------------------------------------- Physician Orders Details Patient Name: [name], [name]. Date of Service: 07/10/2016 1:30 PM Medical Record Patient Account Number: [id number] Number: Treating RN: Baruch Gouty, RN, BSN, Rita Jul 17, 1960 (56 y.o. Other Clinician: Date of Birth/Sex: Male) Treating Bellamarie Pflug Primary Care Physician/Extender: Ali Lowe, ADAKU Physician: Referring Physician: Judeth Horn in Treatment: 5 Verbal / Phone Orders: Yes Clinician: Afful, RN, BSN, Rita Read Back and Verified: Yes Diagnosis Coding Wound Cleansing Wound #6 Left,Lateral Foot o Clean wound with Normal Saline. Anesthetic Wound #6 Left,Lateral Foot o Topical Lidocaine 4% cream applied to wound bed prior to debridement Primary Wound Dressing Wound #6 Left,Lateral Foot o Aquacel Ag Secondary Dressing Wound #6  Left,Lateral Foot o Gauze and Kerlix/Conform Dressing Change Frequency Wound #6 Left,Lateral Foot o Change dressing every day. Follow-up Appointments Wound #6 Left,Lateral Foot o Return Appointment in 1 week. Off-Loading Wound #6 Left,Lateral Foot o Open toe surgical shoe with peg assist. Additional Orders / Instructions Wound #6 Left,Lateral Foot o Increase protein intake. o Activity as  tolerated DECORY, FRENI. ([id number]) Medications-please add to medication list. Wound #6 Left,Lateral Foot o P.O. Antibiotics - zyvox started 06/25/16 Electronic Signature(s) Signed: 07/10/2016 4:54:43 PM By: Regan Lemming BSN, RN Signed: 07/10/2016 6:05:53 PM By: [name] MD Entered By: Regan Lemming on 07/10/2016 14:11:55 [name] ([id number]) -------------------------------------------------------------------------------- Problem List Details Patient Name: [name], [name]. Date of Service: 07/10/2016 1:30 PM Medical Record Patient Account Number: [id number] Number: Treating RN: Baruch Gouty, RN, BSN, Rita 17-Nov-1960 (56 y.o. 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 Date of Service: 07/10/2016 1:30 PM Medical Record Patient Account Number: [id number] Number: Treating RN: Baruch Gouty, RN, BSN, Rita 22-Apr-1961 (56 y.o. Other Clinician: Date of Birth/Sex: Male) Treating Chau Savell Primary Care Physician/Extender: Garlan Fair Physician: Referring Physician: Judeth Horn in Treatment: 18 Subjective Chief  Complaint Information obtained from Patient 03/06/16; patient returns today after further surgery on the left foot as well as a 6 week course of IV antibiotics History of Present Illness (HPI) Pleasant 56 year old with history of diabetes (Hgb A1c 10.8 in 2014) and peripheral neuropathy. No PVD. L ABI 1.1. Status post right great toe partial amputation years ago. He was at work and on 10/22/2014, was injured by a cart, and suffered an ulceration to his left anterior calf. He says that it subsequently became infected, and he was treated with a course of antibiotics. He was found on initial exam to have an ulceration on the dorsum of his left third toe. He was unaware of this and attributes it to pressure from his steel toed boots. More recently he injured his right anterior calf on a cart. Ambulating normally per his baseline. He has been undergoing regular debridements, applying mupirocin cream, and an Ace wrap for edema control. He returns to clinic for follow-up and is without complaints. No pain. No fever or chills. No drainage. 10/25/15; this is a 56 year old man who has type II diabetes with diabetic polyneuropathy. He tells me that he fractured his left fifth metatarsal in June 2016 when he presented with swelling. He does not recall a specific injury. His hemoglobin A1c was apparently too high at the time for consideration of surgery and he was put in some form of offloading. Ultimately he went to surgery in December with an allograft from his calcaneus to this site, plate and screws. He had an x-ray of the foot in March that showed concerns about nonunion. He tells me that in March he had to move and basically moved himself. He was on his foot a lot and then noticed  some drainage from an open area. He has been following with his orthopedic surgeon Dr. Doran Durand. He has been applying a felt donut, dry dressing and using his heel healing sandal. 11/01/15; this is a patient I saw last week for the  first time. He had a small open wound on the plantar aspect of his left foot at roughly the level of the base of his fifth metatarsal. He had a considerable degree of thickened skin around this wound on the plantar aspect which I thought was from chronic pressure on this area. He tells [place] that he had drainage over the course of the week. No systemic symptoms. 11/08/15; culture last week grew Citrobacter korseri. This should've been sensitive to the Augmentin I gave him. He has seen Dr. Doran Durand who did his initial surgery and according the patient the plan is to give this another month and then the hardware might need to come out of this. This seems like a reasonable plan. I will adjust his antibiotics to ciprofloxacin which probably should continue for at least another 2 weeks. I gave him 10 days worth today 11/22/15 the patient has completed antibiotics. He has an appointment with Dr. Doran Durand this Friday. There is [name], [name] ([id number]) improved dimensions around the wound on the left fifth metatarsal base 11/29/15; the patient has completed antibiotics last week. Apparently his appointment with Dr. Doran Durand it is not until this Friday. Dimensions are roughly the same. 12/06/15; saw Dr. Doran Durand. No x-ray told the end of the month, next appointment June 30. We have been using Aquacel Ag 12/13/15: No major change this week. Using Aquacel AG 621/17; arrived this week with maceration around the wound. There was quite a bit of undermining which required surgical debridement. I changed him to [facility] last week, by the patient's admission he was up on this more this week 12/27/15; macerated tissue around the wound is removed with a scalpel and pickups. There is no undermining. Nonviable subcutaneous tissue and skin taken from the superior circumference of the wound is slough from the surface. READMISSION 03/06/16 since I last saw this patient at the end of June, he went for surgery on 01/11/16 by Dr.  Doran Durand of orthopedics. He had a left foot irrigation and debridement, removal of hardware and placement of wound VAC. He is also been followed by Dr. Megan Salon of infectious disease and completed a six-week course of IV Rocephin for group B strep and Enterobacter in the bone at the time of surgery. Apparently at the time of surgery the bone looked healthy so I don't think any bone was actually removed. He has been using silver alginate based dressings on the same wound area at the base of the left fifth metatarsal on the left. I note that he is also had arterial studies on 01/08/16, these showed a left ABI of 1.2 to and a right ABI of 1.3. Waveforms were listed as biphasic. He was not felt to have any specific arterial issues. 03/13/16; no real change in the condition of the wound at the left lateral foot at roughly the base of his fifth metatarsal. Use silver alginate last week. 03/18/16 arrives today with no open area. Being suspicious of the overlying callus I. Some of this back although I see nothing but covering tissue here/epithelium. There is no surrounding tenderness READMISSION 04/16/16 this is a patient I discharged about a month ago. Initially a surgical wound on the lateral aspect of his left foot which subsequently became infected. The story  he is giving today that he went back into his own modified shoe started to notice pain 2 weeks ago he was seen in Dr. Nona Dell office by a physician assistant last Wednesday and according the patient was told that everything looked fine however this is clearly now broken open and he has an open wound in the same spot that we have been dealing with repetitively. Situation is complicated by the fact that he is running short of money on long-term disability. He has not taken his insulin and at least 2 weeks was previously on NovoLog short acting insulin on a sliding scale and TRESIBA 35 units at bedtime. He is no longer able to afford any of his  medication he was in the x-ray on 10/15. A plain x-ray showed healed fracture of the left fifth metatarsal bone status post removal of the associated plate and screw fixation hardware. There was no acute appearing osseous abnormality. His blood work showed a white count of 9.2 with an essentially normal differential comprehensive metabolic panel was normal. Previous CT scan of the foot on 01/08/16 showed no osteomyelitis previous vascular workup showed no evidence of significant PAD on 01/13/16 04/23/16; culture I did last week grew enterococcus [ampicillin sensitive] and MRSA. He saw infectious disease yesterday. They stop the clindamycin and ordered an MRI. This is not unreasonable. All the hardware is out of the foot at this point. 04/30/16 at this point in time we are still awaiting the results of the MRI at this point in time. Patient did have an area which appears to be somewhat macerated in the proximal portion of the wound where there is overlying necrotic skin that is doing nothing more than trapping fluid underneath. He continues to state that he does have some discomfort and again the concern is for the possibility of osteomyelitis hence the reason [name], [name]. ([id number]) for the MRI order. We have been using a silver alginate dressing but again I think the reason this with his macerated as it was is that the dressing obscene could not reach the entirety of the wound due to some necrotic skin covering the proximal portion. No bleeding noted at this point in time on initial evaluation. 05/07/16 we receive the results of patient's MRI which shows that he has no evidence of osteomyelitis. This obviously is excellent news. Patient was definitely happy to hear this. He tells me the wound appears to be doing very well at this point in time and is pleased with the progress currently. 05/15/16; as noted the patient did not have osteomyelitis. He has been released by infectious disease  and orthopedics. His wound is still open he had a debridement last week but complain when he got home he "bleeding for half a day". He is not had any pain. We have been using silver alginate with Kerlix gauze wrap. 05/28/16; patient arrives today with the wound in much the same condition as last time. He has a small opening on the lateral aspect of his foot however with debridement there is clear undermining medially there is no real evidence of infection here and I didn't see any point in culturing this. One would have to wonder if this isn't a simple matter of in adequate offloading as he is been using a healing sandal. 06/05/16; the open area is now on the plantar aspect of his foot and not decide. The wound almost appears to have "migrated". This was the term use by our intake nurse. 06/12/16; open area on  the plantar aspect of his foot. Base of the wound looks very healthy. This will be his second week in a total contact cast 06/19/16; patient arrived today in a total contact cast. There was some expectation from our staff and myself that this area would be healed. Unfortunately the area was boggy and with rec pressure a fairly substantial amount of purulent drainage was obtained. Specimen obtained for culture. The patient had no complaints of systemic problems including fever or chills or instability of his diabetes. There was no pain in the foot. Nevertheless a extensive debridement was required. 06/26/16; patient's culture from the abscess last week grew a combination of MRSA and ampicillin sensitive enterococcus. I had him on Augmentin and Septra however I have elected to give him a full 10 day course of Zyvox instead as I Recently treated this combination of organisms with Augmentin and Septra before. Arrives today with no systemic symptoms 07/03/16; the patient has 2 more treatments of Zyvox and then he is finished antibiotics the wound has improved now mostly on the lateral aspect of  his foot. There is still some tenderness when he walks. 07/10/16; patient arrives today with Zyvox completed. He only has a small open area remaining. Objective Constitutional Sitting or standing Blood Pressure is within target range for patient.. Pulse regular and within target range for patient.Marland Kitchen Respirations regular, non-labored and within target range.. Temperature is normal and within the target range for the patient.. Patient's appearance is neat and clean. Appears in no acute distress. Well nourished and well developed.. Vitals Time Taken: 1:44 PM, Height: 70 in, Weight: 260 lbs, BMI: 37.3, Temperature: 98 F, Pulse: 78 bpm, Respiratory Rate: 16 breaths/min, Blood Pressure: 131/67 mmHg. General Notes: Wound exam; the wound continues to look as though it's closing over although he has thick subcutaneous tissue and some callus around this central area. Around the remnants of the wound I used a [name], [name]. ([id number]) #3 curet to remove thick callus subcutaneous tissue. The wound looks stable smaller. There is still some tenderness on the plantar aspect of his foot that leads me to some degree of concern Integumentary (Hair, Skin) Wound #6 status is Open. Original cause of wound was Gradually Appeared. The wound is located on the Left,Lateral Foot. The wound measures 1cm length x 0.3cm width x 0.3cm depth; 0.236cm^2 area and 0.071cm^3 volume. The wound is limited to skin breakdown. There is no tunneling or undermining noted. There is a medium amount of serosanguineous drainage noted. The wound margin is thickened. There is large (67-100%) pink, pale granulation within the wound bed. There is a small (1-33%) amount of necrotic tissue within the wound bed including Adherent Slough. The periwound skin appearance exhibited: Callus, Moist. The periwound skin appearance did not exhibit: Crepitus, Excoriation, Fluctuance, Friable, Induration, Localized Edema, Rash, Scarring, Dry/Scaly,  Maceration, Atrophie Blanche, Cyanosis, Ecchymosis, Hemosiderin Staining, Mottled, Pallor, Rubor, Erythema. Periwound temperature was noted as No Abnormality. 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 There was a Skin/Subcutaneous Tissue Debridement [id number]) debridement with total area of 0.3 sq cm performed by Ricard Dillon, MD. with the following instrument(s): Curette to remove Non-Viable tissue/material including Fibrin/Slough, Callus, and Subcutaneous after achieving pain control using Lidocaine 4% Topical Solution. A time out was conducted at 14:06,  prior to the start of the procedure. A Minimum amount of bleeding was controlled with Pressure. The procedure was tolerated well with a pain level of 1 throughout and a pain level of 0 following the procedure. Post Debridement Measurements: 1cm length x 0.4cm width x 0.3cm depth; 0.094cm^3 volume. Character of Wound/Ulcer Post Debridement requires further debridement. Severity of Tissue Post Debridement is: Fat layer exposed. Post procedure Diagnosis Wound #6: Same as Pre-Procedure [name], [name] ([id number]) Plan Wound Cleansing: Wound #6 Left,Lateral Foot: Clean wound with Normal Saline. Anesthetic: Wound #6 Left,Lateral Foot: Topical Lidocaine 4% cream applied to wound bed prior to debridement Primary Wound Dressing: Wound #6 Left,Lateral Foot: Aquacel Ag Secondary Dressing: Wound #6 Left,Lateral Foot: Gauze and Kerlix/Conform Dressing Change Frequency: Wound #6 Left,Lateral Foot: Change dressing every day. Follow-up Appointments: Wound #6 Left,Lateral Foot: Return Appointment in 1 week. Off-Loading: Wound #6 Left,Lateral Foot: Open toe surgical shoe with peg  assist. Additional Orders / Instructions: Wound #6 Left,Lateral Foot: Increase protein intake. Activity as tolerated Medications-please add to medication list.: Wound #6 Left,Lateral Foot: P.O. Antibiotics - zyvox started 06/25/16 I have continued with Aquacel AG 2 This lookds like it will close however we are concerned with the viabiltiy of the area Electronic Signature(s) Signed: 07/10/2016 6:05:53 PM By: [name] MD Entered By: [name] on 07/10/2016 14:31:28 [name] ([id number]) -------------------------------------------------------------------------------- SuperBill Details Patient Name: [name]. Date of Service: 07/10/2016 Medical Record Patient Account Number: [id number] Number: Treating RN: Baruch Gouty, RN, BSN, Rita March 22, 1961 (56 y.o. Other Clinician: Date of Birth/Sex: Male) Treating Avaleen Brownley, [place] Primary Care Physician: Gavin Pound Physician/Extender: G Referring Physician: Judeth Horn in Treatment: 18 Diagnosis Coding ICD-10 Codes Code Description E11.621 Type 2 diabetes mellitus with foot ulcer L97.523 Non-pressure chronic ulcer of other part of left foot with necrosis of muscle Facility Procedures CPT4 Code Description: [id number] - DEB SUBQ TISSUE 20 SQ CM/< ICD-10 Description Diagnosis E11.621 Type 2 diabetes mellitus with foot ulcer L97.523 Non-pressure chronic ulcer of other part of left foot Modifier: with necrosi Quantity: 1 s of muscle Physician Procedures CPT4 Code Description: [id number] - WC PHYS SUBQ TISS 20 SQ CM ICD-10 Description Diagnosis E11.621 Type 2 diabetes mellitus with foot ulcer L97.523 Non-pressure chronic ulcer of other part of left foot Modifier: with necrosis Quantity: 1 of muscle Electronic Signature(s) Signed: 07/10/2016 6:05:53 PM By: [name] MD Entered By: [name] on 07/10/2016 14:32:10

## 2016-07-17 ENCOUNTER — Ambulatory Visit: Payer: Commercial Managed Care - PPO | Admitting: Internal Medicine

## 2016-07-24 ENCOUNTER — Encounter: Payer: Commercial Managed Care - PPO | Admitting: Internal Medicine

## 2016-07-24 DIAGNOSIS — E11621 Type 2 diabetes mellitus with foot ulcer: Secondary | ICD-10-CM | POA: Diagnosis not present

## 2016-07-25 NOTE — Progress Notes (Addendum)
ARISTEDE, BUCHMANN ([id number]) Visit Report for 07/24/2016 Chief Complaint Document Details Patient Name: [name], [name]. Date of Service: 07/24/2016 8:15 AM Medical Record Patient Account Number: [id number] Number: Treating RN: Baruch Gouty, RN, BSN, Rita Apr 10, 1961 (56 y.o. Other Clinician: Date of Birth/Sex: Male) Treating Seleena Reimers Primary Care Provider: Gavin Pound Provider/Extender: G Referring Provider: Judeth Horn in Treatment: 20 Information Obtained from: Patient Chief Complaint 03/06/16; patient returns today after further surgery on the left foot as well as a 6 week course of IV antibiotics Electronic Signature(s) Signed: 07/24/2016 6:11:14 PM By: [name] MD Entered By: [name] on 07/24/2016 10:32:35 [name] ([id number]) -------------------------------------------------------------------------------- HPI Details Patient Name: [name], [name]. Date of Service: 07/24/2016 8:15 AM Medical Record Patient Account Number: [id number] Number: Treating RN: Baruch Gouty, RN, BSN, Rita 02/06/1961 (56 y.o. Other Clinician: Date of Birth/Sex: Male) Treating Talene Glastetter Primary Care Provider: Gavin Pound Provider/Extender: G Referring Provider: Gavin Pound Weeks in Treatment: 20 History of Present Illness HPI Description: Pleasant 56 year old with history of diabetes (Hgb A1c 10.8 in 2014) and peripheral neuropathy. No PVD. L ABI 1.1. Status post right great toe partial amputation years ago. He was at work and on 10/22/2014, was injured by a cart, and suffered an ulceration to his left anterior calf. He says that it subsequently became infected, and he was treated with a course of antibiotics. He was found on initial exam to have an ulceration on the dorsum of his left third toe. He was unaware of this and attributes it to pressure from his steel toed boots. More recently he injured his right anterior calf on a cart. Ambulating  normally per his baseline. He has been undergoing regular debridements, applying mupirocin cream, and an Ace wrap for edema control. He returns to clinic for follow-up and is without complaints. No pain. No fever or chills. No drainage. 10/25/15; this is a 56 year old man who has type II diabetes with diabetic polyneuropathy. He tells me that he fractured his left fifth metatarsal in June 2016 when he presented with swelling. He does not recall a specific injury. His hemoglobin A1c was apparently too high at the time for consideration of surgery and he was put in some form of offloading. Ultimately he went to surgery in December with an allograft from his calcaneus to this site, plate and screws. He had an x-ray of the foot in March that showed concerns about nonunion. He tells me that in March he had to move and basically moved himself. He was on his foot a lot and then noticed some drainage from an open area. He has been following with his orthopedic surgeon Dr. Doran Durand. He has been applying a felt donut, dry dressing and using his heel healing sandal. 11/01/15; this is a patient I saw last week for the first time. He had a small open wound on the plantar aspect of his left foot at roughly the level of the base of his fifth metatarsal. He had a considerable degree of thickened skin around this wound on the plantar aspect which I thought was from chronic pressure on this area. He tells [place] that he had drainage over the course of the week. No systemic symptoms. 11/08/15; culture last week grew Citrobacter korseri. This should've been sensitive to the Augmentin I gave him. He has seen Dr. Doran Durand who did his initial surgery and according the patient the plan is to give this another month and then the hardware might need to come out of  this. This seems like a reasonable plan. I will adjust his antibiotics to ciprofloxacin which probably should continue for at least another 2 weeks. I gave him 10 days  worth today 11/22/15 the patient has completed antibiotics. He has an appointment with Dr. Doran Durand this Friday. There is improved dimensions around the wound on the left fifth metatarsal base 11/29/15; the patient has completed antibiotics last week. Apparently his appointment with Dr. Doran Durand it is not until this Friday. Dimensions are roughly the same. 12/06/15; saw Dr. Doran Durand. No x-ray told the end of the month, next appointment June 30. We have been using Aquacel Ag 12/13/15: No major change this week. Using Aquacel AG 621/17; arrived this week with maceration around the wound. There was quite a bit of undermining which LYAM, PROVENCIO. ([id number]) required surgical debridement. I changed him to [facility] last week, by the patient's admission he was up on this more this week 12/27/15; macerated tissue around the wound is removed with a scalpel and pickups. There is no undermining. Nonviable subcutaneous tissue and skin taken from the superior circumference of the wound is slough from the surface. READMISSION 03/06/16 since I last saw this patient at the end of June, he went for surgery on 01/11/16 by Dr. Doran Durand of orthopedics. He had a left foot irrigation and debridement, removal of hardware and placement of wound VAC. He is also been followed by Dr. Megan Salon of infectious disease and completed a six-week course of IV Rocephin for group B strep and Enterobacter in the bone at the time of surgery. Apparently at the time of surgery the bone looked healthy so I don't think any bone was actually removed. He has been using silver alginate based dressings on the same wound area at the base of the left fifth metatarsal on the left. I note that he is also had arterial studies on 01/08/16, these showed a left ABI of 1.2 to and a right ABI of 1.3. Waveforms were listed as biphasic. He was not felt to have any specific arterial issues. 03/13/16; no real change in the condition of the wound at the left  lateral foot at roughly the base of his fifth metatarsal. Use silver alginate last week. 03/18/16 arrives today with no open area. Being suspicious of the overlying callus I. Some of this back although I see nothing but covering tissue here/epithelium. There is no surrounding tenderness READMISSION 04/16/16 this is a patient I discharged about a month ago. Initially a surgical wound on the lateral aspect of his left foot which subsequently became infected. The story he is giving today that he went back into his own modified shoe started to notice pain 2 weeks ago he was seen in Dr. Nona Dell office by a physician assistant last Wednesday and according the patient was told that everything looked fine however this is clearly now broken open and he has an open wound in the same spot that we have been dealing with repetitively. Situation is complicated by the fact that he is running short of money on long-term disability. He has not taken his insulin and at least 2 weeks was previously on NovoLog short acting insulin on a sliding scale and TRESIBA 35 units at bedtime. He is no longer able to afford any of his medication he was in the x-ray on 10/15. A plain x-ray showed healed fracture of the left fifth metatarsal bone status post removal of the associated plate and screw fixation hardware. There was no acute appearing osseous abnormality.  His blood work showed a white count of 9.2 with an essentially normal differential comprehensive metabolic panel was normal. Previous CT scan of the foot on 01/08/16 showed no osteomyelitis previous vascular workup showed no evidence of significant PAD on 01/13/16 04/23/16; culture I did last week grew enterococcus [ampicillin sensitive] and MRSA. He saw infectious disease yesterday. They stop the clindamycin and ordered an MRI. This is not unreasonable. All the hardware is out of the foot at this point. 04/30/16 at this point in time we are still awaiting the results  of the MRI at this point in time. Patient did have an area which appears to be somewhat macerated in the proximal portion of the wound where there is overlying necrotic skin that is doing nothing more than trapping fluid underneath. He continues to state that he does have some discomfort and again the concern is for the possibility of osteomyelitis hence the reason for the MRI order. We have been using a silver alginate dressing but again I think the reason this with his macerated as it was is that the dressing obscene could not reach the entirety of the wound due to some necrotic skin covering the proximal portion. No bleeding noted at this point in time on initial evaluation. 05/07/16 we receive the results of patient's MRI which shows that he has no evidence of osteomyelitis. This obviously is excellent news. Patient was definitely happy to hear this. He tells me the wound appears to be doing very well at this point in time and is pleased with the progress currently. 05/15/16; as noted the patient did not have osteomyelitis. He has been released by infectious disease and JAQUANN, GUARISCO ([id number]) orthopedics. His wound is still open he had a debridement last week but complain when he got home he "bleeding for half a day". He is not had any pain. We have been using silver alginate with Kerlix gauze wrap. 05/28/16; patient arrives today with the wound in much the same condition as last time. He has a small opening on the lateral aspect of his foot however with debridement there is clear undermining medially there is no real evidence of infection here and I didn't see any point in culturing this. One would have to wonder if this isn't a simple matter of in adequate offloading as he is been using a healing sandal. 06/05/16; the open area is now on the plantar aspect of his foot and not decide. The wound almost appears to have "migrated". This was the term use by our intake nurse. 06/12/16; open  area on the plantar aspect of his foot. Base of the wound looks very healthy. This will be his second week in a total contact cast 06/19/16; patient arrived today in a total contact cast. There was some expectation from our staff and myself that this area would be healed. Unfortunately the area was boggy and with rec pressure a fairly substantial amount of purulent drainage was obtained. Specimen obtained for culture. The patient had no complaints of systemic problems including fever or chills or instability of his diabetes. There was no pain in the foot. Nevertheless a extensive debridement was required. 06/26/16; patient's culture from the abscess last week grew a combination of MRSA and ampicillin sensitive enterococcus. I had him on Augmentin and Septra however I have elected to give him a full 10 day course of Zyvox instead as I Recently treated this combination of organisms with Augmentin and Septra before. Arrives today with no systemic symptoms 07/03/16;  the patient has 2 more treatments of Zyvox and then he is finished antibiotics the wound has improved now mostly on the lateral aspect of his foot. There is still some tenderness when he walks. 07/10/16; patient arrives today with Zyvox completed. He only has a small open area remaining. 07/24/16; she arrives here with no overt open area. The covering is thick callus/eschar. Nevertheless there is no open area here. He has some tenderness underneath the area but no overt infection is observed no drainage. The patient has a deformity in the foot with this area weekly to be exposed to more pressure in his foot where nevertheless that something we are going to have to deal with going forward. The patient has diabetic workboots and diabetic shoes. He has had a long difficult course with the area here. This started as a fracture at work. He had bone grafting from his calcaneus and screws. This got infected he had to have more surgery on the area.  Bone at the time of that surgery I think showed enterococcus and group B strep. He had 6 weeks of Rocephin. Since then the area has waxed and waned in its difficulty. Recently he had an MRI in December that did not show osteomyelitis nevertheless he had an abscess that grew MRSA and enterococcus which I elected to treat with Zyvox. This was in December and this wound is actually "healed" over Electronic Signature(s) Signed: 07/24/2016 6:11:14 PM By: [name] MD Entered By: [name] on 07/24/2016 10:35:48 [name] ([id number]) -------------------------------------------------------------------------------- Physical Exam Details Patient Name: JULLIEN, FIELDING. Date of Service: 07/24/2016 8:15 AM Medical Record Patient Account Number: [id number] Number: Treating RN: Baruch Gouty, RN, BSN, Rita [birth year] (55 y.o. Other Clinician: Date of Birth/Sex: Male) Treating Quiera Diffee Primary Care Provider: Gavin Pound Provider/Extender: G Referring Provider: Gavin Pound Weeks in Treatment: 66 Constitutional Patient is hypertensive.. Pulse regular and within target range for patient.Marland Kitchen Respirations regular, non-labored and within target range.. Temperature is normal and within the target range for the patient.. Patient's appearance is neat and clean. Appears in no acute distress. Well nourished and well developed.Marland Kitchen Respiratory Respiratory effort is easy and symmetric bilaterally. Rate is normal at rest and on room air.. Cardiovascular Pedal pulses palpable and strong bilaterally.. Lymphatic Nonpalpable in the popliteal or inguinal area. Psychiatric No evidence of depression, anxiety, or agitation. Calm, cooperative, and communicative. Appropriate interactions and affect.. Notes Wound exam; there is no open area over his left lateral foot at the level of the fifth metatarsal base. He has thick skin, some callus over this entire area. In terms of closure there is no  open area here. He has minimal tenderness on the base of this wound to palpation there is no erythema no drainage Electronic Signature(s) Signed: 07/24/2016 6:11:14 PM By: [name] MD Entered By: [name] on 07/24/2016 10:37:26 [name] ([id number]) -------------------------------------------------------------------------------- Physician Orders Details Patient Name: [name]. Date of Service: 07/24/2016 8:15 AM Medical Record Patient Account Number: [id number] Number: Treating RN: Baruch Gouty, RN, BSN, Rita 1961-03-04 (56 y.o. Other Clinician: Date of Birth/Sex: Male) Treating Tierany Appleby Primary Care Provider: Gavin Pound Provider/Extender: G Referring Provider: Judeth Horn in Treatment: 20 Verbal / Phone Orders: No Diagnosis Coding Follow-up Appointments o Return Appointment in 1 month Discharge From [place] o Discharge from [place] Completed Electronic Signature(s) Signed: 07/24/2016 12:05:52 PM By: Regan Lemming BSN, RN Signed: 07/24/2016 6:11:14 PM By: [name] MD Entered By: Regan Lemming  on 07/24/2016 12:05:51 AVERY, MILLSTEIN ([id number]) -------------------------------------------------------------------------------- Problem List Details Patient Name: GEGORY, BALDASSARI. Date of Service: 07/24/2016 8:15 AM Medical Record Patient Account Number: [id number] Number: Treating RN: Baruch Gouty, RN, BSN, Rita [birth year] (56 y.o. 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 Date of Service: 07/24/2016 8:15 AM Medical Record Patient Account Number: [id number] Number: Treating RN: Baruch Gouty, RN, BSN, Rita Jun 30, 1961 (56 y.o. Other Clinician: Date of Birth/Sex: Male) Treating Ronya Gilcrest Primary Care Provider: Gavin Pound Provider/Extender: G Referring Provider: Judeth Horn in Treatment: 20 Subjective Chief Complaint Information obtained from Patient 03/06/16; patient returns today after further surgery on the left foot as well as a 6 week course of IV antibiotics History of Present Illness (HPI) Pleasant 56 year old with history of diabetes (Hgb A1c 10.8 in 2014) and peripheral neuropathy. No PVD. L ABI 1.1. Status post right great toe partial amputation years ago. He was at work and on 10/22/2014, was injured by a cart, and suffered an ulceration to his left anterior calf. He says that it subsequently became infected, and he was treated with a course of antibiotics. He was found on initial exam to have an ulceration on the dorsum of his left third toe. He was unaware of this and attributes it to pressure from his steel toed boots. More recently he injured his right anterior calf on a cart. Ambulating normally per his baseline. He has been undergoing regular debridements, applying mupirocin cream, and an Ace wrap for edema control. He returns to clinic for follow-up and is without complaints. No pain. No fever or chills. No drainage. 10/25/15; this is a 56 year old man who has type II diabetes with diabetic polyneuropathy. He tells me that  he fractured his left fifth metatarsal in June 2016 when he presented with swelling. He does not recall a specific injury. His hemoglobin A1c was apparently too high at the time for consideration of surgery and he was put in some form of offloading. Ultimately he went to surgery in December with an allograft from his calcaneus to this site, plate and screws. He had an x-ray of the foot in March that showed concerns about nonunion. He tells me that in March he had to move and basically moved himself. He was on his foot a lot and then noticed some drainage from an open area. He has been following with his orthopedic surgeon Dr. Doran Durand. He has been applying a felt donut, dry dressing and using his heel healing sandal. 11/01/15; this is a patient I saw last week for the first time. He had a small open wound on the plantar aspect of his left foot at roughly the level of the base of his fifth metatarsal. He had a considerable degree of thickened skin around this wound on the plantar aspect which I thought was from chronic pressure on this area. He tells [place] that he had drainage over  the course of the week. No systemic symptoms. 11/08/15; culture last week grew Citrobacter korseri. This should've been sensitive to the Augmentin I gave him. He has seen Dr. Doran Durand who did his initial surgery and according the patient the plan is to give this another month and then the hardware might need to come out of this. This seems like a reasonable plan. I will adjust his antibiotics to ciprofloxacin which probably should continue for at least another 2 weeks. I gave him 10 days worth today 11/22/15 the patient has completed antibiotics. He has an appointment with Dr. Doran Durand this Friday. There is improved dimensions around the wound on the left fifth metatarsal base GORGE, KEHR ([id number]) 11/29/15; the patient has completed antibiotics last week. Apparently his appointment with Dr. Doran Durand it is not until this  Friday. Dimensions are roughly the same. 12/06/15; saw Dr. Doran Durand. No x-ray told the end of the month, next appointment June 30. We have been using Aquacel Ag 12/13/15: No major change this week. Using Aquacel AG 621/17; arrived this week with maceration around the wound. There was quite a bit of undermining which required surgical debridement. I changed him to [facility] last week, by the patient's admission he was up on this more this week 12/27/15; macerated tissue around the wound is removed with a scalpel and pickups. There is no undermining. Nonviable subcutaneous tissue and skin taken from the superior circumference of the wound is slough from the surface. READMISSION 03/06/16 since I last saw this patient at the end of June, he went for surgery on 01/11/16 by Dr. Doran Durand of orthopedics. He had a left foot irrigation and debridement, removal of hardware and placement of wound VAC. He is also been followed by Dr. Megan Salon of infectious disease and completed a six-week course of IV Rocephin for group B strep and Enterobacter in the bone at the time of surgery. Apparently at the time of surgery the bone looked healthy so I don't think any bone was actually removed. He has been using silver alginate based dressings on the same wound area at the base of the left fifth metatarsal on the left. I note that he is also had arterial studies on 01/08/16, these showed a left ABI of 1.2 to and a right ABI of 1.3. Waveforms were listed as biphasic. He was not felt to have any specific arterial issues. 03/13/16; no real change in the condition of the wound at the left lateral foot at roughly the base of his fifth metatarsal. Use silver alginate last week. 03/18/16 arrives today with no open area. Being suspicious of the overlying callus I. Some of this back although I see nothing but covering tissue here/epithelium. There is no surrounding tenderness READMISSION 04/16/16 this is a patient I discharged about  a month ago. Initially a surgical wound on the lateral aspect of his left foot which subsequently became infected. The story he is giving today that he went back into his own modified shoe started to notice pain 2 weeks ago he was seen in Dr. Nona Dell office by a physician assistant last Wednesday and according the patient was told that everything looked fine however this is clearly now broken open and he has an open wound in the same spot that we have been dealing with repetitively. Situation is complicated by the fact that he is running short of money on long-term disability. He has not taken his insulin and at least 2 weeks was previously on NovoLog short acting insulin on  a sliding scale and TRESIBA 35 units at bedtime. He is no longer able to afford any of his medication he was in the x-ray on 10/15. A plain x-ray showed healed fracture of the left fifth metatarsal bone status post removal of the associated plate and screw fixation hardware. There was no acute appearing osseous abnormality. His blood work showed a white count of 9.2 with an essentially normal differential comprehensive metabolic panel was normal. Previous CT scan of the foot on 01/08/16 showed no osteomyelitis previous vascular workup showed no evidence of significant PAD on 01/13/16 04/23/16; culture I did last week grew enterococcus [ampicillin sensitive] and MRSA. He saw infectious disease yesterday. They stop the clindamycin and ordered an MRI. This is not unreasonable. All the hardware is out of the foot at this point. 04/30/16 at this point in time we are still awaiting the results of the MRI at this point in time. Patient did have an area which appears to be somewhat macerated in the proximal portion of the wound where there is overlying necrotic skin that is doing nothing more than trapping fluid underneath. He continues to state that he does have some discomfort and again the concern is for the possibility of  osteomyelitis hence the reason for the MRI order. We have been using a silver alginate dressing but again I think the reason this with his TYRONE, MESMER. ([id number]) macerated as it was is that the dressing obscene could not reach the entirety of the wound due to some necrotic skin covering the proximal portion. No bleeding noted at this point in time on initial evaluation. 05/07/16 we receive the results of patient's MRI which shows that he has no evidence of osteomyelitis. This obviously is excellent news. Patient was definitely happy to hear this. He tells me the wound appears to be doing very well at this point in time and is pleased with the progress currently. 05/15/16; as noted the patient did not have osteomyelitis. He has been released by infectious disease and orthopedics. His wound is still open he had a debridement last week but complain when he got home he "bleeding for half a day". He is not had any pain. We have been using silver alginate with Kerlix gauze wrap. 05/28/16; patient arrives today with the wound in much the same condition as last time. He has a small opening on the lateral aspect of his foot however with debridement there is clear undermining medially there is no real evidence of infection here and I didn't see any point in culturing this. One would have to wonder if this isn't a simple matter of in adequate offloading as he is been using a healing sandal. 06/05/16; the open area is now on the plantar aspect of his foot and not decide. The wound almost appears to have "migrated". This was the term use by our intake nurse. 06/12/16; open area on the plantar aspect of his foot. Base of the wound looks very healthy. This will be his second week in a total contact cast 06/19/16; patient arrived today in a total contact cast. There was some expectation from our staff and myself that this area would be healed. Unfortunately the area was boggy and with rec pressure a  fairly substantial amount of purulent drainage was obtained. Specimen obtained for culture. The patient had no complaints of systemic problems including fever or chills or instability of his diabetes. There was no pain in the foot. Nevertheless a extensive debridement was required. 06/26/16; patient's  culture from the abscess last week grew a combination of MRSA and ampicillin sensitive enterococcus. I had him on Augmentin and Septra however I have elected to give him a full 10 day course of Zyvox instead as I Recently treated this combination of organisms with Augmentin and Septra before. Arrives today with no systemic symptoms 07/03/16; the patient has 2 more treatments of Zyvox and then he is finished antibiotics the wound has improved now mostly on the lateral aspect of his foot. There is still some tenderness when he walks. 07/10/16; patient arrives today with Zyvox completed. He only has a small open area remaining. 07/24/16; she arrives here with no overt open area. The covering is thick callus/eschar. Nevertheless there is no open area here. He has some tenderness underneath the area but no overt infection is observed no drainage. The patient has a deformity in the foot with this area weekly to be exposed to more pressure in his foot where nevertheless that something we are going to have to deal with going forward. The patient has diabetic workboots and diabetic shoes. He has had a long difficult course with the area here. This started as a fracture at work. He had bone grafting from his calcaneus and screws. This got infected he had to have more surgery on the area. Bone at the time of that surgery I think showed enterococcus and group B strep. He had 6 weeks of Rocephin. Since then the area has waxed and waned in its difficulty. Recently he had an MRI in December that did not show osteomyelitis nevertheless he had an abscess that grew MRSA and enterococcus which I elected to treat with  Zyvox. This was in December and this wound is actually "healed" over Objective Constitutional Patient is hypertensive.. Pulse regular and within target range for patient.Marland Kitchen Respirations regular, non-labored and within target range.. Temperature is normal and within the target range for the patient.CODIE, FINIGAN ([id number]) appearance is neat and clean. Appears in no acute distress. Well nourished and well developed.. Vitals Time Taken: 8:21 AM, Height: 70 in, Weight: 260 lbs, BMI: 37.3, Temperature: 98.0 F, Pulse: 89 bpm, Respiratory Rate: 16 breaths/min, Blood Pressure: 164/78 mmHg. Respiratory Respiratory effort is easy and symmetric bilaterally. Rate is normal at rest and on room air.. Cardiovascular Pedal pulses palpable and strong bilaterally.. Lymphatic Nonpalpable in the popliteal or inguinal area. Psychiatric No evidence of depression, anxiety, or agitation. Calm, cooperative, and communicative. Appropriate interactions and affect.. General Notes: Wound exam; there is no open area over his left lateral foot at the level of the fifth metatarsal base. He has thick skin, some callus over this entire area. In terms of closure there is no open area here. He has minimal tenderness on the base of this wound to palpation there is no erythema no drainage Integumentary (Hair, Skin) Wound #6 status is Healed - Epithelialized. Original cause of wound was Gradually Appeared. The wound is located on the Left,Lateral Foot. The wound measures 0cm length x 0cm width x 0cm depth; 0cm^2 area and 0cm^3 volume. The wound is limited to skin breakdown. There is no tunneling or undermining noted. There is a none present amount of drainage noted. The wound margin is thickened. There is no granulation within the wound bed. There is no necrotic tissue within the wound bed. The periwound skin appearance exhibited: Callus, Dry/Scaly. The periwound skin appearance did not exhibit: Crepitus,  Excoriation, Induration, Rash, Scarring, Maceration, Atrophie Blanche, Cyanosis, Ecchymosis, Hemosiderin Staining, Mottled, Pallor, Rubor,  Erythema. Periwound temperature was noted as No Abnormality. 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 The other options would be limited. M less than [id number] certain that infection in this area has been eradicated however he has had a recent MRI and the wound progress towards closure. I don't think anything I could do currently would lead to a better outcome. I have therefore elected to watch this for the next 4 weeks. 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 He is to use his diabetic workboots keep the area padded and lubricated. #2 if this area deteriorates reopens we are going to have to involve all the consultants again including Dr. Doran Durand perhaps Dr. Megan Salon of infectious disease. Once again I have advised keeping this area protected with a foam cover and lubricated twice a day #3 I will follow the patient in a month to see how this area is holding up to the rigours of everyday life ann work. He will call if there are probelms beofre them Electronic Signature(s) Signed: 07/26/2016 8:08:50 AM By: Gretta Cool RN, BSN, Kim RN, BSN Signed: 07/30/2016 5:54:02 PM By: [name] MD Previous Signature: 07/24/2016 6:11:14 PM Version By: [name] MD Entered By: Gretta Cool RN, BSN, Kim on 07/26/2016 08:08:50 [name]  ([id number]) -------------------------------------------------------------------------------- [place] Details Patient Name: IZAIS, LITTLEJOHN. Date of Service: 07/24/2016 Medical Record Patient Account Number: [id number] Number: Treating RN: Baruch Gouty, RN, BSN, Rita 1960-10-28 (56 y.o. Other Clinician: Date of Birth/Sex: Male) Treating Winston Misner Primary Care Provider: Gavin Pound Provider/Extender: G Referring Provider: Judeth Horn in Treatment: 20 Diagnosis Coding ICD-10 Codes Code Description E11.621 Type 2 diabetes mellitus with foot ulcer L97.523 Non-pressure chronic ulcer of other part of left foot with necrosis of muscle Facility Procedures CPT4 Code: [id number] Description: [id number] - WOUND CARE VISIT-LEV 2 EST PT Modifier: Quantity: 1 Physician Procedures CPT4 Code Description: [id number] - WC PHYS LEVEL 3 - EST PT ICD-10 Description Diagnosis E11.621 Type 2 diabetes mellitus with foot ulcer L97.523 Non-pressure chronic ulcer of other part of left foo Modifier: t with necrosis Quantity: 1 of muscle Electronic Signature(s) Signed: 07/24/2016 6:11:14 PM By: [name] MD Entered By: [name] on 07/24/2016 10:41:41

## 2016-07-31 NOTE — Progress Notes (Signed)
[name], [name] ([id number]) Visit Report for 07/24/2016 Arrival Information Details Patient Name: [name]. Date of Service: 07/24/2016 8:15 AM Medical Record Number: [id number] Patient Account Number: [id number] Date of Birth/Sex: Mar 10, 1961 (56 y.o. Male) Treating RN: Baruch Gouty, RN, BSN, Velva Harman Primary Care Deejay Koppelman: Gavin Pound Other Clinician: Referring Woodson Macha: Gavin Pound Treating Geraldin Habermehl/Extender: Tito Dine in Treatment: 20 Visit Information History Since Last Visit All ordered tests and consults were completed: No Patient Arrived: Ambulatory Added or deleted any medications: No Arrival Time: 08:20 Any new allergies or adverse reactions: No Accompanied By: self Had a fall or experienced change in No Transfer Assistance: None activities of daily living that may affect Patient Identification Verified: Yes risk of falls: Secondary Verification Process Yes Signs or symptoms of abuse/neglect since last No Completed: visito Patient Requires Transmission-Based No Hospitalized since last visit: No Precautions: Has Dressing in Place as Prescribed: Yes Patient Has Alerts: Yes Pain Present Now: No Patient Alerts: DM II Electronic Signature(s) Signed: 07/30/2016 4:52:27 PM By: Regan Lemming BSN, RN Entered By: Regan Lemming on 07/24/2016 08:21:09 [name] ([id number]) -------------------------------------------------------------------------------- Clinic Level of Care Assessment Details Patient Name: [name]. Date of Service: 07/24/2016 8:15 AM Medical Record Number: [id number] Patient Account Number: [id number] Date of Birth/Sex: [birth year] (56 y.o. Male) Treating RN: Afful, RN, BSN, [employer] Primary Care Breeann Reposa: Gavin Pound Other Clinician: Referring Staci Carver: Gavin Pound Treating Lanett Lasorsa/Extender: Tito Dine in Treatment: 20 Clinic Level of Care Assessment Items TOOL 4 Quantity Score []  - Use when only an EandM is performed on  FOLLOW-UP visit 0 ASSESSMENTS - Nursing Assessment / Reassessment X - Reassessment of Co-morbidities (includes updates in patient status) 1 10 X - Reassessment of Adherence to Treatment Plan 1 5 ASSESSMENTS - Wound and Skin Assessment / Reassessment []  - Simple Wound Assessment / Reassessment - one wound 0 []  - Complex Wound Assessment / Reassessment - multiple wounds 0 []  - Dermatologic / Skin Assessment (not related to wound area) 0 ASSESSMENTS - Focused Assessment []  - Circumferential Edema Measurements - multi extremities 0 []  - Nutritional Assessment / Counseling / Intervention 0 X - Lower Extremity Assessment (monofilament, tuning fork, pulses) 1 5 []  - Peripheral Arterial Disease Assessment (using hand held doppler) 0 ASSESSMENTS - Ostomy and/or Continence Assessment and Care []  - Incontinence Assessment and Management 0 []  - Ostomy Care Assessment and Management (repouching, etc.) 0 PROCESS - Coordination of Care X - Simple Patient / Family Education for ongoing care 1 15 []  - Complex (extensive) Patient / Family Education for ongoing care 0 []  - Staff obtains Programmer, systems, Records, Test Results / Process Orders 0 []  - Staff telephones HHA, Nursing Homes / Clarify orders / etc 0 []  - Routine Transfer to another Facility (non-emergent condition) 0 [name], [name] ([id number]) []  - Routine Hospital Admission (non-emergent condition) 0 []  - New Admissions / Biomedical engineer / Ordering NPWT, Apligraf, etc. 0 []  - Emergency Hospital Admission (emergent condition) 0 []  - Simple Discharge Coordination 0 []  - Complex (extensive) Discharge Coordination 0 PROCESS - Special Needs []  - Pediatric / Minor Patient Management 0 []  - Isolation Patient Management 0 []  - Hearing / Language / Visual special needs 0 []  - Assessment of Community assistance (transportation, D/C planning, etc.) 0 []  - Additional assistance / Altered mentation 0 []  - Support Surface(s) Assessment (bed, cushion,  seat, etc.) 0 INTERVENTIONS - Wound Cleansing / Measurement []  - Simple Wound Cleansing - one wound 0 []  - Complex Wound Cleansing -  multiple wounds 0 X - Wound Imaging (photographs - any number of wounds) 1 5 []  - Wound Tracing (instead of photographs) 0 []  - Simple Wound Measurement - one wound 0 []  - Complex Wound Measurement - multiple wounds 0 INTERVENTIONS - Wound Dressings []  - Small Wound Dressing one or multiple wounds 0 []  - Medium Wound Dressing one or multiple wounds 0 []  - Large Wound Dressing one or multiple wounds 0 []  - Application of Medications - topical 0 []  - Application of Medications - injection 0 INTERVENTIONS - Miscellaneous []  - External ear exam 0 [name], [name]. ([id number]) []  - Specimen Collection (cultures, biopsies, blood, body fluids, etc.) 0 []  - Specimen(s) / Culture(s) sent or taken to Lab for analysis 0 []  - Patient Transfer (multiple staff / Harrel Lemon Lift / Similar devices) 0 []  - Simple Staple / Suture removal (25 or less) 0 []  - Complex Staple / Suture removal (26 or more) 0 []  - Hypo / Hyperglycemic Management (close monitor of Blood Glucose) 0 []  - Ankle / Brachial Index (ABI) - do not check if billed separately 0 X - Vital Signs 1 5 Has the patient been seen at the hospital within the last three years: Yes Total Score: 45 Level Of Care: New/Established - Level 2 Electronic Signature(s) Signed: 07/30/2016 4:52:27 PM By: Regan Lemming BSN, RN Entered By: Regan Lemming on 07/24/2016 08:42:52 [name] ([id number]) -------------------------------------------------------------------------------- Encounter Discharge Information Details Patient Name: [name], [name]. Date of Service: 07/24/2016 8:15 AM Medical Record Number: [id number] Patient Account Number: [id number] Date of Birth/Sex: 08/29/1960 (56 y.o. Male) Treating RN: Baruch Gouty, RN, BSN, Velva Harman Primary Care Dov Dill: Gavin Pound Other Clinician: Referring Amybeth Sieg: Gavin Pound Treating  Shalah Estelle/Extender: Tito Dine in Treatment: 20 Encounter Discharge Information Items Discharge Pain Level: 0 Discharge Condition: Stable Ambulatory Status: Ambulatory Discharge Destination: Home Transportation: Private Auto Accompanied By: self Schedule Follow-up Appointment: No Medication Reconciliation completed and provided to Patient/Care No Arleth Mccullar: Provided on Clinical Summary of Care: 07/24/2016 Form Type Recipient Paper Patient HP Electronic Signature(s) Signed: 07/24/2016 8:54:32 AM By: Ruthine Dose Entered By: Ruthine Dose on 07/24/2016 08:54:32 [name], [name] ([id number]) -------------------------------------------------------------------------------- Lower Extremity Assessment Details Patient Name: [name]. Date of Service: 07/24/2016 8:15 AM Medical Record Number: [id number] Patient Account Number: [id number] Date of Birth/Sex: 1960-07-23 (56 y.o. Male) Treating RN: Baruch Gouty, RN, BSN, Velva Harman Primary Care Rashaun Wichert: Gavin Pound Other Clinician: Referring Oliwia Berzins: Gavin Pound Treating Hyde Sires/Extender: Tito Dine in Treatment: 20 Vascular Assessment Claudication: Claudication Assessment [Left:None] Pulses: Dorsalis Pedis Palpable: [Left:Yes] Posterior Tibial Extremity colors, hair growth, and conditions: Extremity Color: [Left:Hyperpigmented] Hair Growth on Extremity: [Left:No] Temperature of Extremity: [Left:Warm] Capillary Refill: [Left:< 3 seconds] Toe Nail Assessment Left: Right: Thick: Yes Discolored: Yes Deformed: No Improper Length and Hygiene: Yes Electronic Signature(s) Signed: 07/30/2016 4:52:27 PM By: Regan Lemming BSN, RN Entered By: Regan Lemming on 07/24/2016 08:28:19 [name] ([id number]) -------------------------------------------------------------------------------- Multi Wound Chart Details Patient Name: [name]. Date of Service: 07/24/2016 8:15 AM Medical Record Number:  [id number] Patient Account Number: [id number] Date of Birth/Sex: 1961-02-03 (56 y.o. Male) Treating RN: Baruch Gouty, RN, BSN, Velva Harman Primary Care Asaiah Hunnicutt: Gavin Pound Other Clinician: Referring Avonda Toso: Gavin Pound Treating Arria Naim/Extender: Ricard Dillon Weeks in Treatment: 20 Vital Signs Height(in): 70 Pulse(bpm): 89 Weight(lbs): 260 Blood Pressure 164/78 (mmHg): Body Mass Index(BMI): 37 Temperature(F): 98.0 Respiratory Rate 16 (breaths/min): Photos: [6:No Photos] [N/A:N/A] Wound Location: [6:Left Foot - Lateral] [N/A:N/A] Wounding Event: [6:Gradually Appeared] [N/A:N/A] Primary Etiology: [6:Diabetic Wound/Ulcer  of the Lower Extremity] [N/A:N/A] Comorbid History: [6:Hypertension, Type II Diabetes, Neuropathy] [N/A:N/A] Date Acquired: [6:03/25/2016] [N/A:N/A] Weeks of Treatment: [6:14] [N/A:N/A] Wound Status: [6:Healed - Epithelialized] [N/A:N/A] Measurements L x W x D 0x0x0 [N/A:N/A] (cm) Area (cm) : [6:0] [N/A:N/A] Volume (cm) : [6:0] [N/A:N/A] % Reduction in Area: [6:100.00%] [N/A:N/A] % Reduction in Volume: 100.00% [N/A:N/A] Classification: [6:Grade 1] [N/A:N/A] Exudate Amount: [6:None Present] [N/A:N/A] Wound Margin: [6:Thickened] [N/A:N/A] Granulation Amount: [6:None Present (0%)] [N/A:N/A] Necrotic Amount: [6:None Present (0%)] [N/A:N/A] Exposed Structures: [6:Fascia: No Fat Layer (Subcutaneous Tissue) Exposed: No Tendon: No Muscle: No Joint: No Bone: No Limited to Skin Breakdown] [N/A:N/A] Epithelialization: Large (67-100%) N/A N/A Periwound Skin Texture: Callus: Yes N/A N/A Excoriation: No Induration: No Crepitus: No Rash: No Scarring: No Periwound Skin Dry/Scaly: Yes N/A N/A Moisture: Maceration: No Periwound Skin Color: Atrophie Blanche: No N/A N/A Cyanosis: No Ecchymosis: No Erythema: No Hemosiderin Staining: No Mottled: No Pallor: No Rubor: No Temperature: No Abnormality N/A N/A Tenderness on No N/A N/A Palpation: Wound  Preparation: Ulcer Cleansing: N/A N/A Rinsed/Irrigated with Saline Topical Anesthetic Applied: None Treatment Notes Electronic Signature(s) Signed: 07/24/2016 6:11:14 PM By: [name] MD Entered By: [name] on 07/24/2016 10:32:12 [name] ([id number]) -------------------------------------------------------------------------------- [place] Details Patient Name: [name], METHOT. Date of Service: 07/24/2016 8:15 AM Medical Record Number: [id number] Patient Account Number: [id number] Date of Birth/Sex: [birth year] (56 y.o. Male) Treating RN: Baruch Gouty, RN, BSN, [employer] Primary Care Sahaj Bona: Gavin Pound Other Clinician: Referring Lakeysha Slutsky: Gavin Pound Treating Treyshawn Muldrew/Extender: Ricard Dillon Weeks in Treatment: 20 Active Inactive Electronic Signature(s) Signed: 07/30/2016 4:52:27 PM By: Regan Lemming BSN, RN Entered By: Regan Lemming on 07/24/2016 08:45:38 [name] ([id number]) -------------------------------------------------------------------------------- Pain Assessment Details Patient Name: [name], [name]. Date of Service: 07/24/2016 8:15 AM Medical Record Number: [id number] Patient Account Number: [id number] Date of Birth/Sex: 07-30-1960 (56 y.o. Male) Treating RN: Baruch Gouty, RN, BSN, Velva Harman Primary Care Emalene Welte: Gavin Pound Other Clinician: Referring Salif Tay: Gavin Pound Treating Chandler Swiderski/Extender: Tito Dine in Treatment: 20 Active Problems Location of Pain Severity and Description of Pain Patient Has Paino No Site Locations With Dressing Change: No Pain Management and Medication Current Pain Management: Electronic Signature(s) Signed: 07/30/2016 4:52:27 PM By: Regan Lemming BSN, RN Entered By: Regan Lemming on 07/24/2016 08:21:16 [name] ([id number]) -------------------------------------------------------------------------------- Patient/Caregiver Education Details Patient Name: [name]. Date of  Service: 07/24/2016 8:15 AM Medical Record Patient Account Number: [id number] Number: Treating RN: Baruch Gouty, RN, BSN, Rita 30-Oct-1960 (56 y.o. Other Clinician: Date of Birth/Gender: Male) Treating ROBSON, MICHAEL Primary Care Physician: Gavin Pound Physician/Extender: G Referring Physician: Judeth Horn in Treatment: 20 Education Assessment Education Provided To: Patient Education Topics Provided Electronic Signature(s) Signed: 07/30/2016 4:52:27 PM By: Regan Lemming BSN, RN Entered By: Regan Lemming on 07/24/2016 08:46:34 [name] ([id number]) -------------------------------------------------------------------------------- Wound Assessment Details Patient Name: [name], [name]. Date of Service: 07/24/2016 8:15 AM Medical Record Number: [id number] Patient Account Number: [id number] Date of Birth/Sex: Jul 05, 1960 (56 y.o. Male) Treating RN: Baruch Gouty, RN, BSN, Velva Harman Primary Care Aleem Elza: Gavin Pound Other Clinician: Referring Christobal Morado: Gavin Pound Treating Stephone Gum/Extender: Ricard Dillon Weeks in Treatment: 20 Wound Status Wound Number: 6 Primary Diabetic Wound/Ulcer of the Lower Etiology: Extremity Wound Location: Left Foot - Lateral Wound Status: Healed - Epithelialized Wounding Event: Gradually Appeared Comorbid Hypertension, Type II Diabetes, Date Acquired: 03/25/2016 History: Neuropathy Weeks Of Treatment: 14 Clustered Wound: No Photos Photo Uploaded By: Gretta Cool, RN, BSN, Kim on 07/24/2016 16:22:40 Wound Measurements Length: (cm) 0 %  Reduction in Width: (cm) 0 % Reduction in Depth: (cm) 0 Epithelializat Area: (cm) 0 Tunneling: Volume: (cm) 0 Undermining: Area: 100% Volume: 100% ion: Large (67-100%) No No Wound Description Classification: Grade 1 Wound Margin: Thickened Exudate Amount: None Present Foul Odor After Cleansing: No Wound Bed Granulation Amount: None Present (0%) Exposed Structure Necrotic Amount: None Present (0%) Fascia  Exposed: No Fat Layer (Subcutaneous Tissue) Exposed: No Tendon Exposed: No Muscle Exposed: No Joint Exposed: No [name], [name] ([id number]) Bone Exposed: No Limited to Skin Breakdown Periwound Skin Texture Texture Color No Abnormalities Noted: No No Abnormalities Noted: No Callus: Yes Atrophie Blanche: No Crepitus: No Cyanosis: No Excoriation: No Ecchymosis: No Induration: No Erythema: No Rash: No Hemosiderin Staining: No Scarring: No Mottled: No Pallor: No Moisture Rubor: No No Abnormalities Noted: No Dry / Scaly: Yes Temperature / Pain Maceration: No Temperature: No Abnormality Wound Preparation Ulcer Cleansing: Rinsed/Irrigated with Saline Topical Anesthetic Applied: None Electronic Signature(s) Signed: 07/30/2016 4:52:27 PM By: Regan Lemming BSN, RN Entered By: Regan Lemming on 07/24/2016 08:45:08 [name] ([id number]) -------------------------------------------------------------------------------- Vitals Details Patient Name: [name]. Date of Service: 07/24/2016 8:15 AM Medical Record Number: [id number] Patient Account Number: [id number] Date of Birth/Sex: 01/09/1961 (56 y.o. Male) Treating RN: Afful, RN, BSN, [place] Primary Care Wynter Isaacs: Gavin Pound Other Clinician: Referring Reagen Goates: Gavin Pound Treating Lunabelle Oatley/Extender: Tito Dine in Treatment: 20 Vital Signs Time Taken: 08:21 Temperature (F): 98.0 Height (in): 70 Pulse (bpm): 89 Weight (lbs): 260 Respiratory Rate (breaths/min): 16 Body Mass Index (BMI): 37.3 Blood Pressure (mmHg): 164/78 Reference Range: 80 - 120 mg / dl Electronic Signature(s) Signed: 07/30/2016 4:52:27 PM By: Regan Lemming BSN, RN Entered By: Regan Lemming on 07/24/2016 08:22:55

## 2016-08-03 ENCOUNTER — Other Ambulatory Visit: Payer: Self-pay | Admitting: Endocrinology

## 2016-08-03 NOTE — Telephone Encounter (Signed)
Please refer request to PCP Also, f/u ov is due

## 2016-08-21 ENCOUNTER — Encounter: Payer: Commercial Managed Care - PPO | Attending: Internal Medicine | Admitting: Internal Medicine

## 2016-08-21 DIAGNOSIS — E11621 Type 2 diabetes mellitus with foot ulcer: Secondary | ICD-10-CM | POA: Diagnosis not present

## 2016-08-21 DIAGNOSIS — E1142 Type 2 diabetes mellitus with diabetic polyneuropathy: Secondary | ICD-10-CM | POA: Diagnosis not present

## 2016-08-21 DIAGNOSIS — L97523 Non-pressure chronic ulcer of other part of left foot with necrosis of muscle: Secondary | ICD-10-CM | POA: Diagnosis not present

## 2016-08-22 NOTE — Progress Notes (Signed)
JYRESE, [name] ([id number]) Visit Report for 08/21/2016 Arrival Information Details Patient Name: [name], [name]. Date of Service: 08/21/2016 10:30 AM Medical Record Number: [id number] Patient Account Number: [id number] Date of Birth/Sex: 03-21-1961 (56 y.o. Male) Treating RN: Baruch Gouty, RN, BSN, Velva Harman Primary Care Wyoma Genson: Gavin Pound Other Clinician: Referring Keyonte Cookston: Gavin Pound Treating Martise Waddell/Extender: Tito Dine in Treatment: 24 Visit Information History Since Last Visit All ordered tests and consults were No Patient Arrived: Ambulatory completed: Arrival Time: 10:21 Added or deleted any medications: No Accompanied By: self Any new allergies or adverse reactions: No Transfer Assistance: None Had a fall or experienced change in No Patient Identification Verified: Yes activities of daily living that may affect Secondary Verification Process Yes risk of falls: Completed: Signs or symptoms of abuse/neglect No Patient Requires Transmission-Based No since last visito Precautions: Has Dressing in Place as Prescribed: Yes Patient Has Alerts: Yes Has Footwear/Offloading in Place as Yes Patient Alerts: DM II Prescribed: Left: Removable Cast Walker/Walking Boot Pain Present Now: No Electronic Signature(s) Signed: 08/21/2016 5:04:17 PM By: Regan Lemming BSN, RN Entered By: Regan Lemming on 08/21/2016 10:21:56 [name] ([id number]) -------------------------------------------------------------------------------- Encounter Discharge Information Details Patient Name: [name]. Date of Service: 08/21/2016 10:30 AM Medical Record Number: [id number] Patient Account Number: [id number] Date of Birth/Sex: [birth year] (56 y.o. Male) Treating RN: Baruch Gouty, RN, BSN, Velva Harman Primary Care Catheleen Langhorne: Gavin Pound Other Clinician: Referring Ronnell Makarewicz: Gavin Pound Treating Croix Presley/Extender: Tito Dine in Treatment: 24 Encounter Discharge Information  Items Discharge Pain Level: 0 Discharge Condition: Stable Ambulatory Status: Ambulatory Discharge Destination: Home Transportation: Private Auto Accompanied By: self Schedule Follow-up Appointment: No Medication Reconciliation completed and provided to Patient/Care No Gage Weant: Provided on Clinical Summary of Care: 08/21/2016 Form Type Recipient Paper Patient HP Electronic Signature(s) Signed: 08/21/2016 12:39:35 PM By: Regan Lemming BSN, RN Previous Signature: 08/21/2016 11:11:03 AM Version By: Ruthine Dose Entered By: Regan Lemming on 08/21/2016 12:39:35 [name] ([id number]) -------------------------------------------------------------------------------- Lower Extremity Assessment Details Patient Name: [name]. Date of Service: 08/21/2016 10:30 AM Medical Record Number: [id number] Patient Account Number: [id number] Date of Birth/Sex: 1961/02/17 (56 y.o. Male) Treating RN: Baruch Gouty, RN, BSN, Velva Harman Primary Care Lynsie Mcwatters: Gavin Pound Other Clinician: Referring Andrae Claunch: Gavin Pound Treating Salvatore Poe/Extender: Ricard Dillon Weeks in Treatment: 24 Edema Assessment Assessed: [Left: No] [Right: No] Edema: [Left: N] [Right: o] Vascular Assessment Claudication: Claudication Assessment [Right:None] Pulses: Dorsalis Pedis Palpable: [Right:Yes] Posterior Tibial Extremity colors, hair growth, and conditions: Extremity Color: [Right:Dusky] Hair Growth on Extremity: [Right:No] Temperature of Extremity: [Right:Warm] Capillary Refill: [Right:< 3 seconds] Toe Nail Assessment Left: Right: Thick: Yes Discolored: Yes Deformed: No Improper Length and Hygiene: No Electronic Signature(s) Signed: 08/21/2016 5:04:17 PM By: Regan Lemming BSN, RN Entered By: Regan Lemming on 08/21/2016 10:25:13 [name] ([id number]) -------------------------------------------------------------------------------- Multi Wound Chart Details Patient Name: [name]. Date of Service:  08/21/2016 10:30 AM Medical Record Number: [id number] Patient Account Number: [id number] Date of Birth/Sex: 24-Jan-1961 (56 y.o. 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] [8:the Lower Extremity] Comorbid History: [  7:Hypertension, Type II Diabetes, Neuropathy] [8:Hypertension, Type II Diabetes, Neuropathy] [N/A:N/A] Date Acquired: [7:07/31/2016] [8:07/31/2016] [N/A:N/A] Weeks of Treatment: [7:0] [8:0] [N/A:N/A] Wound Status: [7:Open] [8:Open] [N/A:N/A] Measurements L x W x D 2x3x0.1 [8:2.5x1.5x0.1] [N/A:N/A] (cm) Area (cm) : [7:4.712] [8:2.945] [N/A:N/A] Volume (cm) : [7:0.471] [8:0.295] [N/A:N/A] Classification: [7:Unable to visualize wound Unable to visualize wound N/A bed] [8:bed] Exudate Amount: [7:None Present] [8:None Present] [N/A:N/A] Wound Margin: [7:Distinct, outline attached Distinct, outline attached N/A] Granulation Amount: [7:None Present (0%)] [8:None Present (0%)] [N/A:N/A] Necrotic Amount: [7:Large (67-100%)] [8:Large (67-100%)] [N/A:N/A] Necrotic Tissue: [7:Eschar] [8:Eschar] [N/A:N/A] Exposed Structures: [7:Fascia: No Fat Layer (Subcutaneous Fat Layer (Subcutaneous Tissue) Exposed: No Tendon: No Muscle: No Joint: No Bone: No] [8:Fascia: No Tissue) Exposed: No Tendon: No Muscle: No Joint: No Bone: No] [N/A:N/A] Limited to Skin Limited to  Skin Breakdown Breakdown Epithelialization: None None N/A Debridement: Open Wound/Selective Open Wound/Selective N/A [id number]) - Selective [id number]) - Selective Pre-procedure 11:00 11:00 N/A Verification/Time Out Taken: Tissue Debrided: Callus Callus N/A Level: Non-Viable Tissue Non-Viable Tissue N/A Debridement Area (sq 6 3.75 N/A cm): Instrument: Curette Curette N/A Bleeding: None None N/A Procedural Pain: Insensate Insensate N/A Post Procedural Pain: Insensate Insensate N/A Debridement Treatment Procedure was tolerated Procedure was tolerated N/A Response: well well Post Debridement 2x3x0.1 2.5x1.5x0.1 N/A Measurements L x W x D (cm) Post Debridement 0.471 0.295 N/A Volume: (cm) Periwound Skin Texture: Callus: Yes Excoriation: No N/A Excoriation: No Induration: No Induration: No Callus: No Crepitus: No Crepitus: No Rash: No Rash: No Scarring: No Scarring: No Periwound Skin Dry/Scaly: Yes Dry/Scaly: Yes N/A Moisture: Maceration: No Maceration: No Periwound Skin Color: Atrophie Blanche: No Atrophie Blanche: No N/A Cyanosis: No Cyanosis: No Ecchymosis: No Ecchymosis: No Erythema: No Erythema: No Hemosiderin Staining: No Hemosiderin Staining: No Mottled: No Mottled: No Pallor: No Pallor: No Rubor: No Rubor: No Temperature: No Abnormality No Abnormality N/A Tenderness on No No N/A Palpation: Wound Preparation: Ulcer Cleansing: Ulcer Cleansing: N/A Rinsed/Irrigated with Rinsed/Irrigated with Saline Saline Topical Anesthetic Topical Anesthetic Applied: None Applied: None Procedures Performed: Debridement Debridement N/A BREYLIN, CARLYON ([id number]) Treatment Notes Wound #7 (Right, Medial, Plantar Foot) 6. Footwear/Offloading device applied Felt/Foam Removable Cast Walker/Walking Boot Wound #8 (Right, Lateral, Plantar Foot) 6. Footwear/Offloading device applied Felt/Foam Removable Cast Medical laboratory scientific officer) Signed: 08/21/2016 4:59:22 PM By: [name] MD Entered By: [name] on 08/21/2016 12:10:47 [name] ([id number]) -------------------------------------------------------------------------------- [place] Details Patient Name: [name], [name]. Date of Service: 08/21/2016 10:30 AM Medical Record Number: [id number] Patient Account Number: [id number] Date of Birth/Sex: July 25, 1960 (56 y.o. Male) Treating RN: Afful, RN, BSN, [employer] Primary Care Meilin Brosh: Gavin Pound Other Clinician: Referring Jovonni Borquez: Gavin Pound Treating Zimal Weisensel/Extender: Tito Dine in Treatment: 24 Active Inactive ` Orientation to the Wound Care Program Nursing Diagnoses: Knowledge deficit related to the wound healing center program Goals: Patient/caregiver will verbalize understanding of the [place] Program Date Initiated: 08/21/2016 Target Resolution Date: 11/18/2016 Goal Status: Active Interventions: Provide education on orientation to the wound center Notes: ` Pressure Nursing Diagnoses: Knowledge deficit related to causes and risk factors for pressure ulcer development Knowledge deficit related to management of pressures ulcers Potential for impaired tissue integrity related to pressure, friction, moisture, and shear Goals: Patient will remain free from development of additional pressure ulcers Date Initiated: 08/21/2016 Target Resolution Date: 11/18/2016 Goal Status: Active Patient will remain free of pressure ulcers Date Initiated: 08/21/2016 Target Resolution Date: 11/18/2016 Goal Status: Active Patient/caregiver will verbalize risk factors for pressure ulcer development  Date Initiated: 08/21/2016 Target Resolution Date: 11/18/2016 Goal Status: Active Patient/caregiver will verbalize understanding of pressure ulcer management Date Initiated: 08/21/2016 Target Resolution Date: 11/18/2016 Goal Status: Active [name], [name]  ([id number]) Interventions: Assess: immobility, friction, shearing, incontinence upon admission and as needed Assess offloading mechanisms upon admission and as needed Assess potential for pressure ulcer upon admission and as needed Provide education on pressure ulcers Treatment Activities: Patient referred for home evaluation of offloading devices/mattresses : 08/21/2016 Patient referred for pressure reduction/relief devices : 08/21/2016 Notes: ` Wound/Skin Impairment Nursing Diagnoses: Impaired tissue integrity Knowledge deficit related to ulceration/compromised skin integrity Goals: Patient/caregiver will verbalize understanding of skin care regimen Date Initiated: 08/21/2016 Target Resolution Date: 11/18/2016 Goal Status: Active Ulcer/skin breakdown will have a volume reduction of 30% by week 4 Date Initiated: 08/21/2016 Target Resolution Date: 11/18/2016 Goal Status: Active Ulcer/skin breakdown will have a volume reduction of 50% by week 8 Date Initiated: 08/21/2016 Target Resolution Date: 11/18/2016 Goal Status: Active Ulcer/skin breakdown will have a volume reduction of 80% by week 12 Date Initiated: 08/21/2016 Target Resolution Date: 11/18/2016 Goal Status: Active Ulcer/skin breakdown will heal within 14 weeks Date Initiated: 08/21/2016 Target Resolution Date: 11/18/2016 Goal Status: Active Interventions: Assess patient/caregiver ability to obtain necessary supplies Assess patient/caregiver ability to perform ulcer/skin care regimen upon admission and as needed Assess ulceration(s) every visit Provide education on ulcer and skin care Treatment Activities: Skin care regimen initiated : 03/06/2016 Topical wound management initiated : 03/06/2016 [name], [name] ([id number]) Notes: Electronic Signature(s) Signed: 08/21/2016 12:03:35 PM By: Regan Lemming BSN, RN Entered By: Regan Lemming on 08/21/2016 12:03:35 [name]  ([id number]) -------------------------------------------------------------------------------- Pain Assessment Details Patient Name: [name]. Date of Service: 08/21/2016 10:30 AM Medical Record Number: [id number] Patient Account Number: [id number] Date of Birth/Sex: Apr 23, 1961 (56 y.o. Male) Treating RN: Baruch Gouty, RN, BSN, Velva Harman Primary Care Arin Vanosdol: Gavin Pound Other Clinician: Referring Eythan Jayne: Gavin Pound Treating Emmah Bratcher/Extender: Tito Dine in Treatment: 24 Active Problems Location of Pain Severity and Description of Pain Patient Has Paino No Site Locations With Dressing Change: No Pain Management and Medication Current Pain Management: Electronic Signature(s) Signed: 08/21/2016 5:04:17 PM By: Regan Lemming BSN, RN Entered By: Regan Lemming on 08/21/2016 10:22:04 [name] ([id number]) -------------------------------------------------------------------------------- Patient/Caregiver Education Details Patient Name: [name], [name]. Date of Service: 08/21/2016 10:30 AM Medical Record Patient Account Number: [id number] Number: Treating RN: Baruch Gouty, RN, BSN, Rita 1960-09-07 (56 y.o. Other Clinician: Date of Birth/Gender: Male) Treating ROBSON, [place] Primary Care Physician: Gavin Pound Physician/Extender: G Referring Physician: Judeth Horn in Treatment: 24 Education Assessment Education Provided To: Patient Education Topics Provided Pressure: Methods: Explain/Verbal Responses: State content correctly Welcome To [facility]: Methods: Explain/Verbal Responses: State content correctly Wound/Skin Impairment: Methods: Explain/Verbal Responses: State content correctly Electronic Signature(s) Signed: 08/21/2016 5:04:17 PM By: Regan Lemming BSN, RN Entered By: Regan Lemming on 08/21/2016 12:39:53 [name] ([id number]) -------------------------------------------------------------------------------- Wound Assessment  Details Patient Name: [name]. Date of Service: 08/21/2016 10:30 AM Medical Record Number: [id number] Patient Account Number: [id number] Date of Birth/Sex: 10-Apr-1961 (56 y.o. Male) Treating RN: Baruch Gouty, RN, BSN, Velva Harman Primary Care Divit Stipp: Gavin Pound Other Clinician: Referring Bellina Tokarczyk: Gavin Pound Treating Farren Landa/Extender: Ricard Dillon Weeks in Treatment: 24 Wound Status Wound Number: 7 Primary Diabetic Wound/Ulcer of the Lower Etiology: Extremity Wound Location: Right Foot - Plantar, Medial Wound Status: Open Wounding Event: Gradually Appeared Comorbid Hypertension, Type II Diabetes, Date Acquired: 07/31/2016 History: Neuropathy Weeks Of Treatment: 0 Clustered Wound: No Photos  Photo Uploaded By: Regan Lemming on 08/21/2016 13:31:25 Wound Measurements Length: (cm) 2 Width: (cm) 3 Depth: (cm) 0.1 Area: (cm) 4.712 Volume: (cm) 0.471 % Reduction in Area: % Reduction in Volume: Epithelialization: None Tunneling: No Undermining: No Wound Description Classification: Unable to visualize wound bed Wound Margin: Distinct, outline attached Exudate Amount: None Present Foul Odor After Cleansing: No Slough/Fibrino No Wound Bed Granulation Amount: None Present (0%) Exposed Structure Necrotic Amount: Large (67-100%) Fascia Exposed: No Necrotic Quality: Eschar Fat Layer (Subcutaneous Tissue) Exposed: No Tendon Exposed: No Muscle Exposed: No Joint Exposed: No [name], [name]. ([id number]) Bone Exposed: No Limited to Skin Breakdown Periwound Skin Texture Texture Color No Abnormalities Noted: No No Abnormalities Noted: No Callus: Yes Atrophie Blanche: No Crepitus: No Cyanosis: No Excoriation: No Ecchymosis: No Induration: No Erythema: No Rash: No Hemosiderin Staining: No Scarring: No Mottled: No Pallor: No Moisture Rubor: No No Abnormalities Noted: No Dry / Scaly: Yes Temperature / Pain Maceration: No Temperature: No Abnormality Wound  Preparation Ulcer Cleansing: Rinsed/Irrigated with Saline Topical Anesthetic Applied: None Treatment Notes Wound #7 (Right, Medial, Plantar Foot) 6. Footwear/Offloading device applied Felt/Foam Removable Cast Scientific laboratory technician) Signed: 08/21/2016 5:04:17 PM By: Regan Lemming BSN, RN Entered By: Regan Lemming on 08/21/2016 10:30:33 [name] ([id number]) -------------------------------------------------------------------------------- Wound Assessment Details Patient Name: [name]. Date of Service: 08/21/2016 10:30 AM Medical Record Number: [id number] Patient Account Number: [id number] Date of Birth/Sex: 1961-06-02 (56 y.o. Male) Treating RN: Afful, RN, BSN, [employer] Primary Care Shemiah Rosch: Gavin Pound Other Clinician: Referring Shaneice Barsanti: Gavin Pound Treating Maddeline Roorda/Extender: Ricard Dillon Weeks in Treatment: 24 Wound Status Wound Number: 8 Primary Diabetic Wound/Ulcer of the Lower Etiology: Extremity Wound Location: Right Foot - Plantar, Lateral Wound Status: Open Wounding Event: Gradually Appeared Comorbid Hypertension, Type II Diabetes, Date Acquired: 07/31/2016 History: Neuropathy Weeks Of Treatment: 0 Clustered Wound: No Photos Photo Uploaded By: Regan Lemming on 08/21/2016 13:31:40 Wound Measurements Length: (cm) 2.5 Width: (cm) 1.5 Depth: (cm) 0.1 Area: (cm) 2.945 Volume: (cm) 0.295 % Reduction in Area: % Reduction in Volume: Epithelialization: None Tunneling: No Undermining: No Wound Description Classification: Unable to visualize wound bed Wound Margin: Distinct, outline attached Exudate Amount: None Present Foul Odor After Cleansing: No Slough/Fibrino No Wound Bed Granulation Amount: None Present (0%) Exposed Structure Necrotic Amount: Large (67-100%) Fascia Exposed: No Necrotic Quality: Eschar Fat Layer (Subcutaneous Tissue) Exposed: No Tendon Exposed: No Muscle Exposed: No Joint Exposed: No [name], [name].  ([id number]) Bone Exposed: No Limited to Skin Breakdown Periwound Skin Texture Texture Color No Abnormalities Noted: No No Abnormalities Noted: No Callus: No Atrophie Blanche: No Crepitus: No Cyanosis: No Excoriation: No Ecchymosis: No Induration: No Erythema: No Rash: No Hemosiderin Staining: No Scarring: No Mottled: No Pallor: No Moisture Rubor: No No Abnormalities Noted: No Dry / Scaly: Yes Temperature / Pain Maceration: No Temperature: No Abnormality Wound Preparation Ulcer Cleansing: Rinsed/Irrigated with Saline Topical Anesthetic Applied: None Treatment Notes Wound #8 (Right, Lateral, Plantar Foot) 6. Footwear/Offloading device applied Felt/Foam Removable Cast Scientific laboratory technician) Signed: 08/21/2016 5:04:17 PM By: Regan Lemming BSN, RN Entered By: Regan Lemming on 08/21/2016 10:32:15 [name] ([id number]) -------------------------------------------------------------------------------- Vitals Details Patient Name: [name]. Date of Service: 08/21/2016 10:30 AM Medical Record Number: [id number] Patient Account Number: [id number] Date of Birth/Sex: 01-02-1961 (56 y.o. Male) Treating RN: Baruch Gouty, RN, BSN, Velva Harman Primary Care Adolphe Fortunato: Gavin Pound Other Clinician: Referring Lan Mcneill: Gavin Pound Treating Goldye Tourangeau/Extender: Ricard Dillon Weeks in Treatment: 24 Vital Signs Time Taken: 10:24 Temperature (  F): 98.0 Height (in): 70 Pulse (bpm): 93 Weight (lbs): 260 Respiratory Rate (breaths/min): 16 Body Mass Index (BMI): 37.3 Blood Pressure (mmHg): 159/98 Reference Range: 80 - 120 mg / dl Electronic Signature(s) Signed: 08/21/2016 5:04:17 PM By: Regan Lemming BSN, RN Entered By: Regan Lemming on 08/21/2016 10:24:30

## 2016-08-22 NOTE — Progress Notes (Signed)
PAPPU, MCILLWAIN ([id number]) Visit Report for 08/21/2016 Chief Complaint Document Details Patient Name: [name], [name]. Date of Service: 08/21/2016 10:30 AM Medical Record Patient Account Number: [id number] Number: Treating RN: Baruch Gouty, RN, BSN, Rita 1960/09/19 (56 y.o. Other Clinician: Date of Birth/Sex: Male) Treating Vick Filter Primary Care Provider: Gavin Pound Provider/Extender: G Referring Provider: Judeth Horn in Treatment: 24 Information Obtained from: Patient Chief Complaint 03/06/16; patient returns today after further surgery on the left foot as well as a 6 week course of IV antibiotics Electronic Signature(s) Signed: 08/21/2016 4:59:22 PM By: [name] MD Entered By: [name] on 08/21/2016 12:13:46 [name] ([id number]) -------------------------------------------------------------------------------- Debridement Details Patient Name: [name]. Date of Service: 08/21/2016 10:30 AM Medical Record Patient Account Number: [id number] Number: Treating RN: Baruch Gouty, RN, BSN, Rita [birth year] (56 y.o. Other Clinician: Date of Birth/Sex: Male) Treating Tamanika Heiney Primary Care Provider: Gavin Pound Provider/Extender: G Referring Provider: Judeth Horn in Treatment: 24 Debridement Performed for Wound #7 Right,Medial,Plantar Foot Assessment: Performed By: Physician Ricard Dillon, MD Debridement: Open Wound/Selective Debridement Selective Description: Pre-procedure Yes - 11:00 Verification/Time Out Taken: Start Time: 11:00 Level: Non-Viable Tissue Total Area Debrided (L x 2 (cm) x 3 (cm) = 6 (cm) W): Tissue and other Non-Viable, Callus material debrided: Instrument: Curette Bleeding: None End Time: 11:03 Procedural Pain: Insensate Post Procedural Pain: Insensate Response to Treatment: Procedure was tolerated well Post Debridement Measurements of Total Wound Length: (cm) 2 Width: (cm) 3 Depth:  (cm) 0.1 Volume: (cm) 0.471 Character of Wound/Ulcer Post Stable Debridement: Severity of Tissue Post Debridement: Other severity specified Post Procedure Diagnosis Same as Pre-procedure Electronic Signature(s) Signed: 08/21/2016 4:59:22 PM By: [name] MD Signed: 08/21/2016 5:04:17 PM By: Regan Lemming BSN, RN Grissinger, [name] ([id number]) Entered By: [name] on 08/21/2016 12:11:01 [name] ([id number]) -------------------------------------------------------------------------------- Debridement Details Patient Name: [name], [name]. Date of Service: 08/21/2016 10:30 AM Medical Record Patient Account Number: [id number] Number: Treating RN: Baruch Gouty, RN, BSN, Rita [birth year] (56 y.o. Other Clinician: Date of Birth/Sex: Male) Treating Faige Seely Primary Care Provider: Gavin Pound Provider/Extender: G Referring Provider: Judeth Horn in Treatment: 24 Debridement Performed for Wound #8 Right,Lateral,Plantar Foot Assessment: Performed By: Physician Ricard Dillon, MD Debridement: Open Wound/Selective Debridement Selective Description: Pre-procedure Yes - 11:00 Verification/Time Out Taken: Start Time: 11:00 Level: Non-Viable Tissue Total Area Debrided (L x 2.5 (cm) x 1.5 (cm) = 3.75 (cm) W): Tissue and other Non-Viable, Callus material debrided: Instrument: Curette Bleeding: None End Time: 11:03 Procedural Pain: Insensate Post Procedural Pain: Insensate Response to Treatment: Procedure was tolerated well Post Debridement Measurements of Total Wound Length: (cm) 2.5 Width: (cm) 1.5 Depth: (cm) 0.1 Volume: (cm) 0.295 Character of Wound/Ulcer Post Stable Debridement: Severity of Tissue Post Debridement: Other severity specified Post Procedure Diagnosis Same as Pre-procedure Electronic Signature(s) Signed: 08/21/2016 4:59:22 PM By: [name] MD Signed: 08/21/2016 5:04:17 PM By: Regan Lemming BSN, RN Lafave, [name]  ([id number]) Entered By: [name] on 08/21/2016 12:11:13 [name] ([id number]) -------------------------------------------------------------------------------- HPI Details Patient Name: [name], [name]. Date of Service: 08/21/2016 10:30 AM Medical Record Patient Account Number: [id number] Number: Treating RN: Baruch Gouty, RN, BSN, Rita [birth year] (56 y.o. Other Clinician: Date of Birth/Sex: Male) Treating [name] Placeres Primary Care Provider: Gavin Pound Provider/Extender: G Referring Provider: Judeth Horn in Treatment: 24 History of Present Illness HPI Description: Pleasant 56 year old with history of diabetes (Hgb A1c 10.8 in 2014) and peripheral neuropathy. No PVD. L ABI 1.1.  Status post right great toe partial amputation years ago. He was at work and on 10/22/2014, was injured by a cart, and suffered an ulceration to his left anterior calf. He says that it subsequently became infected, and he was treated with a course of antibiotics. He was found on initial exam to have an ulceration on the dorsum of his left third toe. He was unaware of this and attributes it to pressure from his steel toed boots. More recently he injured his right anterior calf on a cart. Ambulating normally per his baseline. He has been undergoing regular debridements, applying mupirocin cream, and an Ace wrap for edema control. He returns to clinic for follow-up and is without complaints. No pain. No fever or chills. No drainage. 10/25/15; this is a 56 year old man who has type II diabetes with diabetic polyneuropathy. He tells me that he fractured his left fifth metatarsal in June 2016 when he presented with swelling. He does not recall a specific injury. His hemoglobin A1c was apparently too high at the time for consideration of surgery and he was put in some form of offloading. Ultimately he went to surgery in December with an allograft from his calcaneus to this site, plate and  screws. He had an x-ray of the foot in March that showed concerns about nonunion. He tells me that in March he had to move and basically moved himself. He was on his foot a lot and then noticed some drainage from an open area. He has been following with his orthopedic surgeon Dr. Doran Durand. He has been applying a felt donut, dry dressing and using his heel healing sandal. 11/01/15; this is a patient I saw last week for the first time. He had a small open wound on the plantar aspect of his left foot at roughly the level of the base of his fifth metatarsal. He had a considerable degree of thickened skin around this wound on the plantar aspect which I thought was from chronic pressure on this area. He tells [place] that he had drainage over the course of the week. No systemic symptoms. 11/08/15; culture last week grew Citrobacter korseri. This should've been sensitive to the Augmentin I gave him. He has seen Dr. Doran Durand who did his initial surgery and according the patient the plan is to give this another month and then the hardware might need to come out of this. This seems like a reasonable plan. I will adjust his antibiotics to ciprofloxacin which probably should continue for at least another 2 weeks. I gave him 10 days worth today 11/22/15 the patient has completed antibiotics. He has an appointment with Dr. Doran Durand this Friday. There is improved dimensions around the wound on the left fifth metatarsal base 11/29/15; the patient has completed antibiotics last week. Apparently his appointment with Dr. Doran Durand it is not until this Friday. Dimensions are roughly the same. 12/06/15; saw Dr. Doran Durand. No x-ray told the end of the month, next appointment June 30. We have been using Aquacel Ag 12/13/15: No major change this week. Using Aquacel AG 621/17; arrived this week with maceration around the wound. There was quite a bit of undermining which MANPREET, MIZZELL. ([id number]) required surgical debridement. I changed him  to [facility] last week, by the patient's admission he was up on this more this week 12/27/15; macerated tissue around the wound is removed with a scalpel and pickups. There is no undermining. Nonviable subcutaneous tissue and skin taken from the superior circumference of the wound is slough  from the surface. READMISSION 03/06/16 since I last saw this patient at the end of June, he went for surgery on 01/11/16 by Dr. Doran Durand of orthopedics. He had a left foot irrigation and debridement, removal of hardware and placement of wound VAC. He is also been followed by Dr. Megan Salon of infectious disease and completed a six-week course of IV Rocephin for group B strep and Enterobacter in the bone at the time of surgery. Apparently at the time of surgery the bone looked healthy so I don't think any bone was actually removed. He has been using silver alginate based dressings on the same wound area at the base of the left fifth metatarsal on the left. I note that he is also had arterial studies on 01/08/16, these showed a left ABI of 1.2 to and a right ABI of 1.3. Waveforms were listed as biphasic. He was not felt to have any specific arterial issues. 03/13/16; no real change in the condition of the wound at the left lateral foot at roughly the base of his fifth metatarsal. Use silver alginate last week. 03/18/16 arrives today with no open area. Being suspicious of the overlying callus I. Some of this back although I see nothing but covering tissue here/epithelium. There is no surrounding tenderness READMISSION 04/16/16 this is a patient I discharged about a month ago. Initially a surgical wound on the lateral aspect of his left foot which subsequently became infected. The story he is giving today that he went back into his own modified shoe started to notice pain 2 weeks ago he was seen in Dr. Nona Dell office by a physician assistant last Wednesday and according the patient was told that everything looked  fine however this is clearly now broken open and he has an open wound in the same spot that we have been dealing with repetitively. Situation is complicated by the fact that he is running short of money on long-term disability. He has not taken his insulin and at least 2 weeks was previously on NovoLog short acting insulin on a sliding scale and TRESIBA 35 units at bedtime. He is no longer able to afford any of his medication he was in the x-ray on 10/15. A plain x-ray showed healed fracture of the left fifth metatarsal bone status post removal of the associated plate and screw fixation hardware. There was no acute appearing osseous abnormality. His blood work showed a white count of 9.2 with an essentially normal differential comprehensive metabolic panel was normal. Previous CT scan of the foot on 01/08/16 showed no osteomyelitis previous vascular workup showed no evidence of significant PAD on 01/13/16 04/23/16; culture I did last week grew enterococcus [ampicillin sensitive] and MRSA. He saw infectious disease yesterday. They stop the clindamycin and ordered an MRI. This is not unreasonable. All the hardware is out of the foot at this point. 04/30/16 at this point in time we are still awaiting the results of the MRI at this point in time. Patient did have an area which appears to be somewhat macerated in the proximal portion of the wound where there is overlying necrotic skin that is doing nothing more than trapping fluid underneath. He continues to state that he does have some discomfort and again the concern is for the possibility of osteomyelitis hence the reason for the MRI order. We have been using a silver alginate dressing but again I think the reason this with his macerated as it was is that the dressing obscene could not reach the entirety  of the wound due to some necrotic skin covering the proximal portion. No bleeding noted at this point in time on initial evaluation. 05/07/16 we  receive the results of patient's MRI which shows that he has no evidence of osteomyelitis. This obviously is excellent news. Patient was definitely happy to hear this. He tells me the wound appears to be doing very well at this point in time and is pleased with the progress currently. 05/15/16; as noted the patient did not have osteomyelitis. He has been released by infectious disease and [name], [name] ([id number]) orthopedics. His wound is still open he had a debridement last week but complain when he got home he "bleeding for half a day". He is not had any pain. We have been using silver alginate with Kerlix gauze wrap. 05/28/16; patient arrives today with the wound in much the same condition as last time. He has a small opening on the lateral aspect of his foot however with debridement there is clear undermining medially there is no real evidence of infection here and I didn't see any point in culturing this. One would have to wonder if this isn't a simple matter of in adequate offloading as he is been using a healing sandal. 06/05/16; the open area is now on the plantar aspect of his foot and not decide. The wound almost appears to have "migrated". This was the term use by our intake nurse. 06/12/16; open area on the plantar aspect of his foot. Base of the wound looks very healthy. This will be his second week in a total contact cast 06/19/16; patient arrived today in a total contact cast. There was some expectation from our staff and myself that this area would be healed. Unfortunately the area was boggy and with rec pressure a fairly substantial amount of purulent drainage was obtained. Specimen obtained for culture. The patient had no complaints of systemic problems including fever or chills or instability of his diabetes. There was no pain in the foot. Nevertheless a extensive debridement was required. 06/26/16; patient's culture from the abscess last week grew a combination of MRSA and  ampicillin sensitive enterococcus. I had him on Augmentin and Septra however I have elected to give him a full 10 day course of Zyvox instead as I Recently treated this combination of organisms with Augmentin and Septra before. Arrives today with no systemic symptoms 07/03/16; the patient has 2 more treatments of Zyvox and then he is finished antibiotics the wound has improved now mostly on the lateral aspect of his foot. There is still some tenderness when he walks. 07/10/16; patient arrives today with Zyvox completed. He only has a small open area remaining. 07/24/16; she arrives here with no overt open area. The covering is thick callus/eschar. Nevertheless there is no open area here. He has some tenderness underneath the area but no overt infection is observed no drainage. The patient has a deformity in the foot with this area weekly to be exposed to more pressure in his foot where nevertheless that something we are going to have to deal with going forward. The patient has diabetic workboots and diabetic shoes. He has had a long difficult course with the area here. This started as a fracture at work. He had bone grafting from his calcaneus and screws. This got infected he had to have more surgery on the area. Bone at the time of that surgery I think showed enterococcus and group B strep. He had 6 weeks of Rocephin. Since then the  area has waxed and waned in its difficulty. Recently he had an MRI in December that did not show osteomyelitis nevertheless he had an abscess that grew MRSA and enterococcus which I elected to treat with Zyvox. This was in December and this wound is actually "healed" over 08/21/16; the patient came in today for his one-month follow-up visit. The area on the lateral aspect of his left foot looks much the same as some month ago. There is no evidence of an open wound here. However the patient tells me about a week after he went back to work he developed severe pain and  swelling in the plantar aspect of his right foot first and fifth metatarsal heads. 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 He went to see his podiatrist at [facility]. She told him that he could not work on his feet. She told him to go back and his cam walking boot on the left. Not have open wounds obviously on the right. 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 Date of Service: 08/21/2016 10:30 AM Medical Record Patient Account Number: [id number] Number: Treating RN: Baruch Gouty, RN, BSN, Rita September 21, 1960 (56 y.o. Other Clinician: Date of Birth/Sex: Male) Treating Cailee Blanke Primary Care Provider: Gavin Pound Provider/Extender: G Referring Provider: Judeth Horn in Treatment: 24 Constitutional Patient is hypertensive.. Pulse regular and within target range for patient.Marland Kitchen Respirations regular, non-labored and within target range.. Temperature is normal and within the target range for the patient.. Patient's appearance is neat and clean. Appears in no acute distress. Well nourished and well developed.. Eyes Conjunctivae clear. No discharge.. Cardiovascular Pedal pulses palpable and strong bilaterally.. Notes Wound exam; there is no open wound on his left lateral foot thick callus however no tenderness no erythema and no drainage. On the right foot he also has thick callus over the right first metatarsal head and right fifth metatarsal head. Using a #5 curet I removed some callus to make sure there was no open area and either one  of these areas and after debridement I could not see any opening. There is no swelling no surrounding erythema Electronic Signature(s) Signed: 08/21/2016 4:59:22 PM By: [name] MD Entered By: [name] on 08/21/2016 12:17:06 [name] ([id number]) -------------------------------------------------------------------------------- Physician Orders Details Patient Name: [name]. Date of Service: 08/21/2016 10:30 AM Medical Record Patient Account Number: [id number] Number: Treating RN: Baruch Gouty, RN, BSN, Rita Aug 10, 1960 (56 y.o. Other Clinician: Date of Birth/Sex: Male) Treating Johanthan Kneeland Primary Care Provider: Gavin Pound Provider/Extender: G Referring Provider: Judeth Horn in Treatment: 46 Verbal / Phone Orders: No Diagnosis Coding Wound Cleansing Wound #7 Right,Medial,Plantar Foot o Cleanse wound with mild soap and water Wound #8 Right,Lateral,Plantar Foot o Cleanse wound with mild soap and water Skin Barriers/Peri-Wound Care Wound #7 Right,Medial,Plantar Foot o Moisturizing lotion Wound #8 Right,Lateral,Plantar Foot o Moisturizing lotion Dressing Change Frequency Wound #7 Right,Medial,Plantar Foot o Change dressing every day. Wound #8 Right,Lateral,Plantar Foot o Change dressing every day. Follow-up Appointments Wound #7 Right,Medial,Plantar Foot o Return Appointment in 1 month Wound #8 Right,Lateral,Plantar Foot o Return Appointment in 1 month Off-Loading Wound #7 Right,Medial,Plantar Foot o  Other: - Felt, Diabetic shoes with insoles Wound #8 Right,Lateral,Plantar Foot o Other: - Felt, Diabetic shoes with insoles [name], [name] ([id number]) Electronic Signature(s) Signed: 08/21/2016 4:59:22 PM By: [name] MD Signed: 08/21/2016 5:04:17 PM By: Regan Lemming BSN, RN Entered By: Regan Lemming on 08/21/2016 11:05:01 [name]  ([id number]) -------------------------------------------------------------------------------- Problem List Details Patient Name: ROCK, RIZK. Date of Service: 08/21/2016 10:30 AM Medical Record Patient Account Number: [id number] Number: Treating RN: Baruch Gouty, RN, BSN, Rita 12/25/1960 (56 y.o. Other Clinician: Date of Birth/Sex: Male) Treating Azayla Polo Primary Care Provider: Gavin Pound Provider/Extender: G Referring Provider: Judeth Horn in Treatment: 24 Active Problems ICD-10 Encounter Code Description Active Date Diagnosis E11.621 Type 2 diabetes mellitus with foot ulcer 03/06/2016 Yes L97.523 Non-pressure chronic ulcer of other part of left foot with 03/06/2016 Yes necrosis of muscle Inactive Problems ICD-10 Code Description Active Date Inactive Date M86.672 Other chronic osteomyelitis, left ankle and foot 03/06/2016 03/06/2016 Resolved Problems Electronic Signature(s) Signed: 08/21/2016 4:59:22 PM By: [name] MD Entered By: [name] on 08/21/2016 12:10:39 [name] ([id number]) -------------------------------------------------------------------------------- Progress Note Details Patient Name: [name]. Date of Service: 08/21/2016 10:30 AM Medical Record Patient Account Number: [id number] Number: Treating RN: Baruch Gouty, RN, BSN, Rita 1961/05/12 (56 y.o. Other Clinician: Date of Birth/Sex: Male) Treating Myeshia Fojtik Primary Care Provider: Gavin Pound Provider/Extender: G Referring Provider: Judeth Horn in Treatment: 24 Subjective Chief Complaint Information obtained from Patient 03/06/16; patient returns today after further surgery on the left foot as well as a 6 week course of IV antibiotics History of Present Illness (HPI) Pleasant 56 year old with history of diabetes (Hgb A1c 10.8 in 2014) and peripheral neuropathy. No PVD. L ABI 1.1. Status post right great toe partial amputation years ago. He was at  work and on 10/22/2014, was injured by a cart, and suffered an ulceration to his left anterior calf. He says that it subsequently became infected, and he was treated with a course of antibiotics. He was found on initial exam to have an ulceration on the dorsum of his left third toe. He was unaware of this and attributes it to pressure from his steel toed boots. More recently he injured his right anterior calf on a cart. Ambulating normally per his baseline. He has been undergoing regular debridements, applying mupirocin cream, and an Ace wrap for edema control. He returns to clinic for follow-up and is without complaints. No pain. No fever or chills. No drainage. 10/25/15; this is a 56 year old man who has type II diabetes with diabetic polyneuropathy. He tells me that he fractured his left fifth metatarsal in June 2016 when he presented with swelling. He does not recall a specific injury. His hemoglobin A1c was apparently too high at the time for consideration of surgery and he was put in some form of offloading. Ultimately he went to surgery in December with an allograft from his calcaneus to this site, plate and screws. He had an x-ray of the foot in March that showed concerns about nonunion. He tells me that in March he had to move and basically moved himself. He was on his foot a lot and then noticed some drainage from an open area. He has been following with his orthopedic surgeon Dr. Doran Durand. He has been applying a felt donut, dry dressing and using his heel healing sandal. 11/01/15; this is a patient I saw last week for the first time. He had a small open wound on the plantar aspect of his  left foot at roughly the level of the base of his fifth metatarsal. He had a considerable degree of thickened skin around this wound on the plantar aspect which I thought was from chronic pressure on this area. He tells [place] that he had drainage over the course of the week. No systemic symptoms. 11/08/15;  culture last week grew Citrobacter korseri. This should've been sensitive to the Augmentin I gave him. He has seen Dr. Doran Durand who did his initial surgery and according the patient the plan is to give this another month and then the hardware might need to come out of this. This seems like a reasonable plan. I will adjust his antibiotics to ciprofloxacin which probably should continue for at least another 2 weeks. I gave him 10 days worth today 11/22/15 the patient has completed antibiotics. He has an appointment with Dr. Doran Durand this Friday. There is improved dimensions around the wound on the left fifth metatarsal base [name], [name] ([id number]) 11/29/15; the patient has completed antibiotics last week. Apparently his appointment with Dr. Doran Durand it is not until this Friday. Dimensions are roughly the same. 12/06/15; saw Dr. Doran Durand. No x-ray told the end of the month, next appointment June 30. We have been using Aquacel Ag 12/13/15: No major change this week. Using Aquacel AG 621/17; arrived this week with maceration around the wound. There was quite a bit of undermining which required surgical debridement. I changed him to [facility] last week, by the patient's admission he was up on this more this week 12/27/15; macerated tissue around the wound is removed with a scalpel and pickups. There is no undermining. Nonviable subcutaneous tissue and skin taken from the superior circumference of the wound is slough from the surface. READMISSION 03/06/16 since I last saw this patient at the end of June, he went for surgery on 01/11/16 by Dr. Doran Durand of orthopedics. He had a left foot irrigation and debridement, removal of hardware and placement of wound VAC. He is also been followed by Dr. Megan Salon of infectious disease and completed a six-week course of IV Rocephin for group B strep and Enterobacter in the bone at the time of surgery. Apparently at the time of surgery the bone looked healthy so I don't  think any bone was actually removed. He has been using silver alginate based dressings on the same wound area at the base of the left fifth metatarsal on the left. I note that he is also had arterial studies on 01/08/16, these showed a left ABI of 1.2 to and a right ABI of 1.3. Waveforms were listed as biphasic. He was not felt to have any specific arterial issues. 03/13/16; no real change in the condition of the wound at the left lateral foot at roughly the base of his fifth metatarsal. Use silver alginate last week. 03/18/16 arrives today with no open area. Being suspicious of the overlying callus I. Some of this back although I see nothing but covering tissue here/epithelium. There is no surrounding tenderness READMISSION 04/16/16 this is a patient I discharged about a month ago. Initially a surgical wound on the lateral aspect of his left foot which subsequently became infected. The story he is giving today that he went back into his own modified shoe started to notice pain 2 weeks ago he was seen in Dr. Nona Dell office by a physician assistant last Wednesday and according the patient was told that everything looked fine however this is clearly now broken open and he has an open wound  in the same spot that we have been dealing with repetitively. Situation is complicated by the fact that he is running short of money on long-term disability. He has not taken his insulin and at least 2 weeks was previously on NovoLog short acting insulin on a sliding scale and TRESIBA 35 units at bedtime. He is no longer able to afford any of his medication he was in the x-ray on 10/15. A plain x-ray showed healed fracture of the left fifth metatarsal bone status post removal of the associated plate and screw fixation hardware. There was no acute appearing osseous abnormality. His blood work showed a white count of 9.2 with an essentially normal differential comprehensive metabolic panel was normal. Previous CT  scan of the foot on 01/08/16 showed no osteomyelitis previous vascular workup showed no evidence of significant PAD on 01/13/16 04/23/16; culture I did last week grew enterococcus [ampicillin sensitive] and MRSA. He saw infectious disease yesterday. They stop the clindamycin and ordered an MRI. This is not unreasonable. All the hardware is out of the foot at this point. 04/30/16 at this point in time we are still awaiting the results of the MRI at this point in time. Patient did have an area which appears to be somewhat macerated in the proximal portion of the wound where there is overlying necrotic skin that is doing nothing more than trapping fluid underneath. He continues to state that he does have some discomfort and again the concern is for the possibility of osteomyelitis hence the reason for the MRI order. We have been using a silver alginate dressing but again I think the reason this with his [name], [name]. ([id number]) macerated as it was is that the dressing obscene could not reach the entirety of the wound due to some necrotic skin covering the proximal portion. No bleeding noted at this point in time on initial evaluation. 05/07/16 we receive the results of patient's MRI which shows that he has no evidence of osteomyelitis. This obviously is excellent news. Patient was definitely happy to hear this. He tells me the wound appears to be doing very well at this point in time and is pleased with the progress currently. 05/15/16; as noted the patient did not have osteomyelitis. He has been released by infectious disease and orthopedics. His wound is still open he had a debridement last week but complain when he got home he "bleeding for half a day". He is not had any pain. We have been using silver alginate with Kerlix gauze wrap. 05/28/16; patient arrives today with the wound in much the same condition as last time. He has a small opening on the lateral aspect of his foot however with  debridement there is clear undermining medially there is no real evidence of infection here and I didn't see any point in culturing this. One would have to wonder if this isn't a simple matter of in adequate offloading as he is been using a healing sandal. 06/05/16; the open area is now on the plantar aspect of his foot and not decide. The wound almost appears to have "migrated". This was the term use by our intake nurse. 06/12/16; open area on the plantar aspect of his foot. Base of the wound looks very healthy. This will be his second week in a total contact cast 06/19/16; patient arrived today in a total contact cast. There was some expectation from our staff and myself that this area would be healed. Unfortunately the area was boggy and with rec  pressure a fairly substantial amount of purulent drainage was obtained. Specimen obtained for culture. The patient had no complaints of systemic problems including fever or chills or instability of his diabetes. There was no pain in the foot. Nevertheless a extensive debridement was required. 06/26/16; patient's culture from the abscess last week grew a combination of MRSA and ampicillin sensitive enterococcus. I had him on Augmentin and Septra however I have elected to give him a full 10 day course of Zyvox instead as I Recently treated this combination of organisms with Augmentin and Septra before. Arrives today with no systemic symptoms 07/03/16; the patient has 2 more treatments of Zyvox and then he is finished antibiotics the wound has improved now mostly on the lateral aspect of his foot. There is still some tenderness when he walks. 07/10/16; patient arrives today with Zyvox completed. He only has a small open area remaining. 07/24/16; she arrives here with no overt open area. The covering is thick callus/eschar. Nevertheless there is no open area here. He has some tenderness underneath the area but no overt infection is observed no drainage. The  patient has a deformity in the foot with this area weekly to be exposed to more pressure in his foot where nevertheless that something we are going to have to deal with going forward. The patient has diabetic workboots and diabetic shoes. He has had a long difficult course with the area here. This started as a fracture at work. He had bone grafting from his calcaneus and screws. This got infected he had to have more surgery on the area. Bone at the time of that surgery I think showed enterococcus and group B strep. He had 6 weeks of Rocephin. Since then the area has waxed and waned in its difficulty. Recently he had an MRI in December that did not show osteomyelitis nevertheless he had an abscess that grew MRSA and enterococcus which I elected to treat with Zyvox. This was in December and this wound is actually "healed" over 08/21/16; the patient came in today for his one-month follow-up visit. The area on the lateral aspect of his left foot looks much the same as some month ago. There is no evidence of an open wound here. However the patient tells me about a week after he went back to work he developed severe pain and swelling in the plantar aspect of his right foot first and fifth metatarsal heads. 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 He went to see his podiatrist at [facility]. She told him that he could not work on his feet. She told him to go back and his cam walking boot on the left. Not have open wounds obviously on the right. The patient is actually gone ahead and retired from his job because he does not feel he can work on his [name], [name]. ([id number]) Objective Constitutional Patient is hypertensive.. Pulse regular and within target range for patient.Marland Kitchen Respirations regular, non-labored and within target range.. Temperature is normal and within the target range for the patient..  Patient's appearance is neat and clean. Appears in no acute distress. Well nourished and well developed.. Vitals Time Taken: 10:24 AM, Height: 70 in, Weight: 260 lbs, BMI: 37.3, Temperature: 98.0 F, Pulse: 93 bpm, Respiratory Rate: 16 breaths/min, Blood Pressure: 159/98 mmHg. Eyes Conjunctivae clear. No discharge.. Cardiovascular Pedal pulses palpable and strong bilaterally.. General Notes: Wound exam; there is no  open wound on his left lateral foot thick callus however no tenderness no erythema and no drainage. On the right foot he also has thick callus over the right first metatarsal head and right fifth metatarsal head. Using a #5 curet I removed some callus to make sure there was no open area and either one of these areas and after debridement I could not see any opening. There is no swelling no surrounding erythema Integumentary (Hair, Skin) Wound #7 status is Open. Original cause of wound was Gradually Appeared. The wound is located on the [place]. The wound measures 2cm length x 3cm width x 0.1cm depth; 4.712cm^2 area and 0.471cm^3 volume. The wound is limited to skin breakdown. There is no tunneling or undermining noted. There is a none present amount of drainage noted. The wound margin is distinct with the outline attached to the wound base. There is no granulation within the wound bed. There is a large (67-100%) amount of necrotic tissue within the wound bed including Eschar. The periwound skin appearance exhibited: Callus, Dry/Scaly. The periwound skin appearance did not exhibit: Crepitus, Excoriation, Induration, Rash, Scarring, Maceration, Atrophie Blanche, Cyanosis, Ecchymosis, Hemosiderin Staining, Mottled, Pallor, Rubor, Erythema. Periwound temperature was noted as No Abnormality. Wound #8 status is Open. Original cause of wound was Gradually Appeared. The wound is located on the [place]. The wound measures 2.5cm length x 1.5cm width x  0.1cm depth; 2.945cm^2 area and 0.295cm^3 volume. The wound is limited to skin breakdown. There is no tunneling or undermining noted. There is a none present amount of drainage noted. The wound margin is distinct with the outline attached to the wound base. There is no granulation within the wound bed. There is a large (67-100%) amount of necrotic tissue within the wound bed including Eschar. The periwound skin appearance exhibited: Dry/Scaly. The periwound skin appearance did not exhibit: Callus, Crepitus, Excoriation, Induration, Rash, Scarring, Maceration, Atrophie Blanche, Cyanosis, Ecchymosis, Hemosiderin Staining, Mottled, Pallor, Rubor, Erythema. Periwound temperature was noted as No Abnormality. 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 There was a Non-Viable Tissue Open Wound/Selective [phone]) debridement with total area of 6 sq cm performed by Ricard Dillon, MD. with the following instrument(s): Curette to remove Non- Viable tissue/material including Callus. A time out was conducted at 11:00, prior to the start of the procedure. There was no bleeding. The procedure was tolerated well with a pain level of Insensate throughout and a pain level of Insensate following the procedure. Post Debridement Measurements: 2cm length x 3cm width x 0.1cm depth; 0.471cm^3 volume. Character of Wound/Ulcer Post Debridement is stable. Severity of Tissue Post Debridement is: Other severity specified. Post procedure Diagnosis Wound #7: Same as Pre-Procedure Wound #8 Wound #8 is a Diabetic Wound/Ulcer of the Lower Extremity located on the Right,Lateral,Plantar Foot . There was a Non-Viable Tissue Open  Wound/Selective [phone]) debridement with total area of 3.75 sq cm performed by Ricard Dillon, MD. with the following instrument(s): Curette to remove Non- Viable tissue/material including Callus. A time out was conducted at 11:00, prior to the start of the procedure. There was no bleeding. The procedure was tolerated well with a pain level of Insensate throughout and a pain level of Insensate following the procedure. Post Debridement Measurements: 2.5cm  length x 1.5cm width x 0.1cm depth; 0.295cm^3 volume. Character of Wound/Ulcer Post Debridement is stable. Severity of Tissue Post Debridement is: Other severity specified. Post procedure Diagnosis Wound #8: Same as Pre-Procedure Plan [name], [name] ([id number]) Wound Cleansing: Wound #7 Right,Medial,Plantar Foot: Cleanse wound with mild soap and water Wound #8 Right,Lateral,Plantar Foot: Cleanse wound with mild soap and water Skin Barriers/Peri-Wound Care: Wound #7 Right,Medial,Plantar Foot: Moisturizing lotion Wound #8 Right,Lateral,Plantar Foot: Moisturizing lotion Dressing Change Frequency: Wound #7 Right,Medial,Plantar Foot: Change dressing every day. Wound #8 Right,Lateral,Plantar Foot: Change dressing every day. Follow-up Appointments: Wound #7 Right,Medial,Plantar Foot: Return Appointment in 1 month Wound #8 Right,Lateral,Plantar Foot: Return Appointment in 1 month Off-Loading: Wound #7 Right,Medial,Plantar Foot: Other: - Felt, Diabetic shoes with insoles Wound #8 Right,Lateral,Plantar Foot: Other: - Felt, Diabetic shoes with insoles continue to offload areas on right foot adised diabeteic shoes which he says he has he has a cam bood on the left foot and will see his pdiatrist this Friday I have given him an appointmnet for one month however he probably can be discharged at that point Electronic Signature(s) Signed: 08/21/2016 4:59:22 PM By: [name] MD Entered By: [name] on 08/21/2016  12:18:56 [name] ([id number]) -------------------------------------------------------------------------------- [place] Details Patient Name: [name]. Date of Service: 08/21/2016 Medical Record Patient Account Number: [id number] Number: Treating RN: Baruch Gouty, RN, BSN, Rita 03/20/1961 (56 y.o. Other Clinician: Date of Birth/Sex: Male) Treating Tatsuya Okray Primary Care Provider: Gavin Pound Provider/Extender: G Referring Provider: Gavin Pound Service Line: Outpatient Weeks in Treatment: 24 Diagnosis Coding ICD-10 Codes Code Description E11.621 Type 2 diabetes mellitus with foot ulcer L97.523 Non-pressure chronic ulcer of other part of left foot with necrosis of muscle L97.518 Non-pressure chronic ulcer of other part of right foot with other specified severity Facility Procedures CPT4: Description Modifier Quantity Code [id number] - DEBRIDE WOUND 1ST 20 SQ CM OR < 1 ICD-10 Description Diagnosis L97.518 Non-pressure chronic ulcer of other part of right foot with other specified severity Physician Procedures CPT4: Description Modifier Quantity Code [id number] - WC PHYS DEBR WO ANESTH 20 SQ CM 1 ICD-10 Description Diagnosis L97.518 Non-pressure chronic ulcer of other part of right foot with other specified severity Electronic Signature(s) Signed: 08/21/2016 4:59:22 PM By: [name] MD Entered By: [name] on 08/21/2016 12:19:59

## 2016-09-18 ENCOUNTER — Encounter: Payer: Commercial Managed Care - PPO | Attending: Internal Medicine | Admitting: Internal Medicine

## 2016-09-18 DIAGNOSIS — E1142 Type 2 diabetes mellitus with diabetic polyneuropathy: Secondary | ICD-10-CM | POA: Insufficient documentation

## 2016-09-18 DIAGNOSIS — E11621 Type 2 diabetes mellitus with foot ulcer: Secondary | ICD-10-CM | POA: Insufficient documentation

## 2016-09-18 DIAGNOSIS — L97523 Non-pressure chronic ulcer of other part of left foot with necrosis of muscle: Secondary | ICD-10-CM | POA: Insufficient documentation

## 2016-09-19 NOTE — Progress Notes (Signed)
[name], [name] ([id number]) Visit Report for 09/18/2016 Chief Complaint Document Details Patient Name: [name], [name]. Date of Service: 09/18/2016 10:30 AM Medical Record Patient Account Number: [id number] Number: Treating RN: [name] Gouty, RN, BSN, [name] 10/01/1960 (56 y.o. Other Clinician: Date of Birth/Sex: Male) Treating [name] Primary Care Provider: Gavin [name] Provider/Extender: G Referring Provider: Judeth [name] in Treatment: 28 Information Obtained from: Patient Chief Complaint 03/06/16; patient returns today after further surgery on the left foot as well as a 6 week course of IV antibiotics Electronic Signature(s) Signed: 09/18/2016 4:37:19 PM By: [name] Ham MD Entered By: [name] on 09/18/2016 12:21:13 [name] ([id number]) -------------------------------------------------------------------------------- HPI Details Patient Name: [name]. Date of Service: 09/18/2016 10:30 AM Medical Record Patient Account Number: [id number] Number: Treating RN: [name] Gouty, RN, BSN, [name] September 19, 1960 (56 y.o. Other Clinician: Date of Birth/Sex: Male) Treating [name] Primary Care Provider: Gavin [name] Provider/Extender: G Referring Provider: Gavin [name] Weeks in Treatment: 28 History of Present Illness HPI Description: Pleasant 56 year old with history of diabetes (Hgb A1c 10.8 in 2014) and peripheral neuropathy. No PVD. L ABI 1.1. Status post right great toe partial amputation years ago. He was at work and on 10/22/2014, was injured by a cart, and suffered an ulceration to his left anterior calf. He says that it subsequently became infected, and he was treated with a course of antibiotics. He was found on initial exam to have an ulceration on the dorsum of his left third toe. He was unaware of this and attributes it to pressure from his steel toed boots. More recently he injured his right anterior calf on a cart. Ambulating  normally per his baseline. He has been undergoing regular debridements, applying mupirocin cream, and an Ace wrap for edema control. He returns to clinic for follow-up and is without complaints. No pain. No fever or chills. No drainage. 10/25/15; this is a 56 year old man who has type II diabetes with diabetic polyneuropathy. He tells me that he fractured his left fifth metatarsal in June 2016 when he presented with swelling. He does not recall a specific injury. His hemoglobin A1c was apparently too high at the time for consideration of surgery and he was put in some form of offloading. Ultimately he went to surgery in December with an allograft from his calcaneus to this site, plate and screws. He had an x-ray of the foot in March that showed concerns about nonunion. He tells me that in March he had to move and basically moved himself. He was on his foot a lot and then noticed some drainage from an open area. He has been following with his orthopedic surgeon Dr. Doran [name]. He has been applying a felt donut, dry dressing and using his heel healing sandal. 11/01/15; this is a patient I saw last week for the first time. He had a small open wound on the plantar aspect of his left foot at roughly the level of the base of his fifth metatarsal. He had a considerable degree of thickened skin around this wound on the plantar aspect which I thought was from chronic pressure on this area. He tells [place] that he had drainage over the course of the week. No systemic symptoms. 11/08/15; culture last week grew Citrobacter korseri. This should've been sensitive to the Augmentin I gave him. He has seen Dr. Doran [name] who did his initial surgery and according the patient the plan is to give this another month and then the hardware might need to come out of  this. This seems like a reasonable plan. I will adjust his antibiotics to ciprofloxacin which probably should continue for at least another 2 weeks. I gave him 10 days  worth today 11/22/15 the patient has completed antibiotics. He has an appointment with Dr. Doran [name] this Friday. There is improved dimensions around the wound on the left fifth metatarsal base 11/29/15; the patient has completed antibiotics last week. Apparently his appointment with Dr. Doran [name] it is not until this Friday. Dimensions are roughly the same. 12/06/15; saw Dr. Doran [name]. No x-ray told the end of the month, next appointment June 30. We have been using Aquacel Ag 12/13/15: No major change this week. Using Aquacel AG 621/17; arrived this week with maceration around the wound. There was quite a bit of undermining which [name], PROVENCIO. ([id number]) required surgical debridement. I changed him to [facility] last week, by the patient's admission he was up on this more this week 12/27/15; macerated tissue around the wound is removed with a scalpel and pickups. There is no undermining. Nonviable subcutaneous tissue and skin taken from the superior circumference of the wound is slough from the surface. READMISSION 03/06/16 since I last saw this patient at the end of June, he went for surgery on 01/11/16 by Dr. Doran [name] of orthopedics. He had a left foot irrigation and debridement, removal of hardware and placement of wound VAC. He is also been followed by Dr. Megan [name] of infectious disease and completed a six-week course of IV Rocephin for group B strep and Enterobacter in the bone at the time of surgery. Apparently at the time of surgery the bone looked healthy so I don't think any bone was actually removed. He has been using silver alginate based dressings on the same wound area at the base of the left fifth metatarsal on the left. I note that he is also had arterial studies on 01/08/16, these showed a left ABI of 1.2 to and a right ABI of 1.3. Waveforms were listed as biphasic. He was not felt to have any specific arterial issues. 03/13/16; no real change in the condition of the wound at the left  lateral foot at roughly the base of his fifth metatarsal. Use silver alginate last week. 03/18/16 arrives today with no open area. Being suspicious of the overlying callus I. Some of this back although I see nothing but covering tissue here/epithelium. There is no surrounding tenderness READMISSION 04/16/16 this is a patient I discharged about a month ago. Initially a surgical wound on the lateral aspect of his left foot which subsequently became infected. The story he is giving today that he went back into his own modified shoe started to notice pain 2 weeks ago he was seen in Dr. Nona Dell office by a physician assistant last Wednesday and according the patient was told that everything looked fine however this is clearly now broken open and he has an open wound in the same spot that we have been dealing with repetitively. Situation is complicated by the fact that he is running short of money on long-term disability. He has not taken his insulin and at least 2 weeks was previously on NovoLog short acting insulin on a sliding scale and TRESIBA 35 units at bedtime. He is no longer able to afford any of his medication he was in the x-ray on 10/15. A plain x-ray showed healed fracture of the left fifth metatarsal bone status post removal of the associated plate and screw fixation hardware. There was no acute appearing osseous abnormality.  His blood work showed a white count of 9.2 with an essentially normal differential comprehensive metabolic panel was normal. Previous CT scan of the foot on 01/08/16 showed no osteomyelitis previous vascular workup showed no evidence of significant PAD on 01/13/16 04/23/16; culture I did last week grew enterococcus [ampicillin sensitive] and MRSA. He saw infectious disease yesterday. They stop the clindamycin and ordered an MRI. This is not unreasonable. All the hardware is out of the foot at this point. 04/30/16 at this point in time we are still awaiting the results  of the MRI at this point in time. Patient did have an area which appears to be somewhat macerated in the proximal portion of the wound where there is overlying necrotic skin that is doing nothing more than trapping fluid underneath. He continues to state that he does have some discomfort and again the concern is for the possibility of osteomyelitis hence the reason for the MRI order. We have been using a silver alginate dressing but again I think the reason this with his macerated as it was is that the dressing obscene could not reach the entirety of the wound due to some necrotic skin covering the proximal portion. No bleeding noted at this point in time on initial evaluation. 05/07/16 we receive the results of patient's MRI which shows that he has no evidence of osteomyelitis. This obviously is excellent news. Patient was definitely happy to hear this. He tells me the wound appears to be doing very well at this point in time and is pleased with the progress currently. 05/15/16; as noted the patient did not have osteomyelitis. He has been released by infectious disease and [name], [name] ([id number]) orthopedics. His wound is still open he had a debridement last week but complain when he got home he "bleeding for half a day". He is not had any pain. We have been using silver alginate with Kerlix gauze wrap. 05/28/16; patient arrives today with the wound in much the same condition as last time. He has a small opening on the lateral aspect of his foot however with debridement there is clear undermining medially there is no real evidence of infection here and I didn't see any point in culturing this. One would have to wonder if this isn't a simple matter of in adequate offloading as he is been using a healing sandal. 06/05/16; the open area is now on the plantar aspect of his foot and not decide. The wound almost appears to have "migrated". This was the term use by our intake nurse. 06/12/16; open  area on the plantar aspect of his foot. Base of the wound looks very healthy. This will be his second week in a total contact cast 06/19/16; patient arrived today in a total contact cast. There was some expectation from our staff and myself that this area would be healed. Unfortunately the area was boggy and with rec pressure a fairly substantial amount of purulent drainage was obtained. Specimen obtained for culture. The patient had no complaints of systemic problems including fever or chills or instability of his diabetes. There was no pain in the foot. Nevertheless a extensive debridement was required. 06/26/16; patient's culture from the abscess last week grew a combination of MRSA and ampicillin sensitive enterococcus. I had him on Augmentin and Septra however I have elected to give him a full 10 day course of Zyvox instead as I Recently treated this combination of organisms with Augmentin and Septra before. Arrives today with no systemic symptoms 07/03/16;  the patient has 2 more treatments of Zyvox and then he is finished antibiotics the wound has improved now mostly on the lateral aspect of his foot. There is still some tenderness when he walks. 07/10/16; patient arrives today with Zyvox completed. He only has a small open area remaining. 07/24/16; she arrives here with no overt open area. The covering is thick callus/eschar. Nevertheless there is no open area here. He has some tenderness underneath the area but no overt infection is observed no drainage. The patient has a deformity in the foot with this area weekly to be exposed to more pressure in his foot where nevertheless that something we are going to have to deal with going forward. The patient has diabetic workboots and diabetic shoes. He has had a long difficult course with the area here. This started as a fracture at work. He had bone grafting from his calcaneus and screws. This got infected he had to have more surgery on the area.  Bone at the time of that surgery I think showed enterococcus and group B strep. He had 6 weeks of Rocephin. Since then the area has waxed and waned in its difficulty. Recently he had an MRI in December that did not show osteomyelitis nevertheless he had an abscess that grew MRSA and enterococcus which I elected to treat with Zyvox. This was in December and this wound is actually "healed" over 08/21/16; the patient came in today for his one-month follow-up visit. The area on the lateral aspect of his left foot looks much the same as some month ago. There is no evidence of an open wound here. However the patient tells me about a week after he went back to work he developed severe pain and swelling in the plantar aspect of his right foot first and fifth metatarsal heads. 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 He went to see his podiatrist at [facility]. She told him that he could not work on his feet. She told him to go back and his cam walking boot on the left. Not have open wounds obviously on the right. The patient is actually gone ahead and retired from his job because he does not feel he can work on his feet 09/18/16; patient comes back in saying he recently had some pain on the lateral aspect of his left foot. Asked that we look at this. Other than that all of his wounds have a viable surface. He has diabetic shoes. He is retired from work. Electronic Signature(s) Signed: 09/18/2016 4:37:19 PM By: [name] Ham MD Entered By: [name] on 09/18/2016 12:22:00 [name] ([id number] Brood, [name] ([id number]) -------------------------------------------------------------------------------- Physical Exam Details Patient Name: [name], [name]. Date of Service: 09/18/2016 10:30 AM Medical Record Patient Account Number: [id number] Number: Treating RN: [name] Gouty, RN, BSN,  [name] [birth year] (56 y.o. Other Clinician: Date of Birth/Sex: Male) Treating [name] Primary Care Provider: Gavin [name] Provider/Extender: G Referring Provider: Gavin [name] Weeks in Treatment: 28 Constitutional Sitting or standing Blood Pressure is within target range for patient.. Pulse regular and within target range for patient.Marland Kitchen Respirations regular, non-labored and within target range.. Patient's appearance is neat and clean. Appears in no acute distress. Well nourished and well developed.. Cardiovascular Pedal pulses palpable and strong bilaterally.. Notes Would exam; there is no open area on the lateral aspect of his left foot. Thick callus however and I probed disability with  a #5 curet although I can identify no open area here there appears to be a viable surface. No surrounding erythema. He also has thick callus on his fifth and first metatarsal heads on the right there is no open area here either Electronic Signature(s) Signed: 09/18/2016 4:37:19 PM By: [name] Ham MD Entered By: [name] on 09/18/2016 12:23:06 [name] ([id number]) -------------------------------------------------------------------------------- Physician Orders Details Patient Name: [name], [name]. Date of Service: 09/18/2016 10:30 AM Medical Record Patient Account Number: [id number] Number: Treating RN: [name] Gouty, RN, BSN, [name] 22-Dec-1960 (56 y.o. Other Clinician: Date of Birth/Sex: Male) Treating [name] Primary Care Provider: Gavin [name] Provider/Extender: G Referring Provider: Judeth [name] in Treatment: 56 Verbal / Phone Orders: No Diagnosis Coding Discharge From [facility] Services o Discharge from [place] Completed Electronic Signature(s) Signed: 09/18/2016 4:11:25 PM By: Regan Lemming BSN, RN Signed: 09/18/2016 4:37:19 PM By: [name] Ham MD Entered By: Regan Lemming on 09/18/2016 11:03:17 [name]  ([id number]) -------------------------------------------------------------------------------- Problem List Details Patient Name: [name], [name]. Date of Service: 09/18/2016 10:30 AM Medical Record Patient Account Number: [id number] Number: Treating RN: [name] Gouty, RN, BSN, [name] 1961-01-13 (56 y.o. 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 Date of Service: 09/18/2016 10:30 AM Medical Record Patient Account Number: [id number] Number: Treating RN: [name] Gouty, RN, BSN, [name] 01-May-1961 (56 y.o. Other Clinician: Date of Birth/Sex: Male) Treating [name] Primary Care Provider: Gavin [name] Provider/Extender: G Referring Provider: Judeth [name] in Treatment: 28 Subjective Chief Complaint Information obtained from Patient 03/06/16; patient returns today after further surgery on the left foot as well as a 6 week course of IV antibiotics History of Present Illness (HPI) Pleasant 56 year old with history of diabetes (Hgb A1c 10.8 in 2014) and peripheral neuropathy. No PVD. L ABI 1.1. Status post right great toe partial amputation years ago. He was at  work and on 10/22/2014, was injured by a cart, and suffered an ulceration to his left anterior calf. He says that it subsequently became infected, and he was treated with a course of antibiotics. He was found on initial exam to have an ulceration on the dorsum of his left third toe. He was unaware of this and attributes it to pressure from his steel toed boots. More recently he injured his right anterior calf on a cart. Ambulating normally per his baseline. He has been undergoing regular debridements, applying mupirocin cream, and an Ace wrap for edema control. He returns to clinic for follow-up and is without complaints. No pain. No fever or chills. No drainage. 10/25/15; this is a 56 year old man who has type II diabetes with diabetic polyneuropathy. He tells me that he fractured his left fifth metatarsal in June 2016 when he presented with swelling. He does not recall a specific injury. His hemoglobin A1c was apparently too high at the time for consideration of surgery and he was put in  some form of offloading. Ultimately he went to surgery in December with an allograft from his calcaneus to this site, plate and screws. He had an x-ray of the foot in March that showed concerns about nonunion. He tells me that in March he had to move and basically moved himself. He was on his foot a lot and then noticed some drainage from an open area. He has been following with his orthopedic surgeon Dr. Doran [name]. He has been applying a felt donut, dry dressing and using his heel healing sandal. 11/01/15; this is a patient I saw last week for the first time. He had a small open wound on the plantar aspect of his left foot at roughly the level of the base of his fifth metatarsal. He had a considerable degree of thickened skin around this wound on the plantar aspect which I thought was from chronic pressure on this area. He tells [place] that he had drainage over the course of the week. No systemic symptoms. 11/08/15;  culture last week grew Citrobacter korseri. This should've been sensitive to the Augmentin I gave him. He has seen Dr. Doran [name] who did his initial surgery and according the patient the plan is to give this another month and then the hardware might need to come out of this. This seems like a reasonable plan. I will adjust his antibiotics to ciprofloxacin which probably should continue for at least another 2 weeks. I gave him 10 days worth today 11/22/15 the patient has completed antibiotics. He has an appointment with Dr. Doran [name] this Friday. There is improved dimensions around the wound on the left fifth metatarsal base [name], [name] ([id number]) 11/29/15; the patient has completed antibiotics last week. Apparently his appointment with Dr. Doran [name] it is not until this Friday. Dimensions are roughly the same. 12/06/15; saw Dr. Doran [name]. No x-ray told the end of the month, next appointment June 30. We have been using Aquacel Ag 12/13/15: No major change this week. Using Aquacel AG 621/17; arrived this week with maceration around the wound. There was quite a bit of undermining which required surgical debridement. I changed him to [facility] last week, by the patient's admission he was up on this more this week 12/27/15; macerated tissue around the wound is removed with a scalpel and pickups. There is no undermining. Nonviable subcutaneous tissue and skin taken from the superior circumference of the wound is slough from the surface. READMISSION 03/06/16 since I last saw this patient at the end of June, he went for surgery on 01/11/16 by Dr. Doran [name] of orthopedics. He had a left foot irrigation and debridement, removal of hardware and placement of wound VAC. He is also been followed by Dr. Megan [name] of infectious disease and completed a six-week course of IV Rocephin for group B strep and Enterobacter in the bone at the time of surgery. Apparently at the time of surgery the bone looked healthy so I don't  think any bone was actually removed. He has been using silver alginate based dressings on the same wound area at the base of the left fifth metatarsal on the left. I note that he is also had arterial studies on 01/08/16, these showed a left ABI of 1.2 to and a right ABI of 1.3. Waveforms were listed as biphasic. He was not felt to have any specific arterial issues. 03/13/16; no real change in the condition of the wound at the left lateral foot at roughly the base of his fifth metatarsal. Use silver alginate last week.  03/18/16 arrives today with no open area. Being suspicious of the overlying callus I. Some of this back although I see nothing but covering tissue here/epithelium. There is no surrounding tenderness READMISSION 04/16/16 this is a patient I discharged about a month ago. Initially a surgical wound on the lateral aspect of his left foot which subsequently became infected. The story he is giving today that he went back into his own modified shoe started to notice pain 2 weeks ago he was seen in Dr. Nona Dell office by a physician assistant last Wednesday and according the patient was told that everything looked fine however this is clearly now broken open and he has an open wound in the same spot that we have been dealing with repetitively. Situation is complicated by the fact that he is running short of money on long-term disability. He has not taken his insulin and at least 2 weeks was previously on NovoLog short acting insulin on a sliding scale and TRESIBA 35 units at bedtime. He is no longer able to afford any of his medication he was in the x-ray on 10/15. A plain x-ray showed healed fracture of the left fifth metatarsal bone status post removal of the associated plate and screw fixation hardware. There was no acute appearing osseous abnormality. His blood work showed a white count of 9.2 with an essentially normal differential comprehensive metabolic panel was normal. Previous CT  scan of the foot on 01/08/16 showed no osteomyelitis previous vascular workup showed no evidence of significant PAD on 01/13/16 04/23/16; culture I did last week grew enterococcus [ampicillin sensitive] and MRSA. He saw infectious disease yesterday. They stop the clindamycin and ordered an MRI. This is not unreasonable. All the hardware is out of the foot at this point. 04/30/16 at this point in time we are still awaiting the results of the MRI at this point in time. Patient did have an area which appears to be somewhat macerated in the proximal portion of the wound where there is overlying necrotic skin that is doing nothing more than trapping fluid underneath. He continues to state that he does have some discomfort and again the concern is for the possibility of osteomyelitis hence the reason for the MRI order. We have been using a silver alginate dressing but again I think the reason this with his [name], CLINK. ([id number]) macerated as it was is that the dressing obscene could not reach the entirety of the wound due to some necrotic skin covering the proximal portion. No bleeding noted at this point in time on initial evaluation. 05/07/16 we receive the results of patient's MRI which shows that he has no evidence of osteomyelitis. This obviously is excellent news. Patient was definitely happy to hear this. He tells me the wound appears to be doing very well at this point in time and is pleased with the progress currently. 05/15/16; as noted the patient did not have osteomyelitis. He has been released by infectious disease and orthopedics. His wound is still open he had a debridement last week but complain when he got home he "bleeding for half a day". He is not had any pain. We have been using silver alginate with Kerlix gauze wrap. 05/28/16; patient arrives today with the wound in much the same condition as last time. He has a small opening on the lateral aspect of his foot however with  debridement there is clear undermining medially there is no real evidence of infection here and I didn't see any point in  culturing this. One would have to wonder if this isn't a simple matter of in adequate offloading as he is been using a healing sandal. 06/05/16; the open area is now on the plantar aspect of his foot and not decide. The wound almost appears to have "migrated". This was the term use by our intake nurse. 06/12/16; open area on the plantar aspect of his foot. Base of the wound looks very healthy. This will be his second week in a total contact cast 06/19/16; patient arrived today in a total contact cast. There was some expectation from our staff and myself that this area would be healed. Unfortunately the area was boggy and with rec pressure a fairly substantial amount of purulent drainage was obtained. Specimen obtained for culture. The patient had no complaints of systemic problems including fever or chills or instability of his diabetes. There was no pain in the foot. Nevertheless a extensive debridement was required. 06/26/16; patient's culture from the abscess last week grew a combination of MRSA and ampicillin sensitive enterococcus. I had him on Augmentin and Septra however I have elected to give him a full 10 day course of Zyvox instead as I Recently treated this combination of organisms with Augmentin and Septra before. Arrives today with no systemic symptoms 07/03/16; the patient has 2 more treatments of Zyvox and then he is finished antibiotics the wound has improved now mostly on the lateral aspect of his foot. There is still some tenderness when he walks. 07/10/16; patient arrives today with Zyvox completed. He only has a small open area remaining. 07/24/16; she arrives here with no overt open area. The covering is thick callus/eschar. Nevertheless there is no open area here. He has some tenderness underneath the area but no overt infection is observed no drainage. The  patient has a deformity in the foot with this area weekly to be exposed to more pressure in his foot where nevertheless that something we are going to have to deal with going forward. The patient has diabetic workboots and diabetic shoes. He has had a long difficult course with the area here. This started as a fracture at work. He had bone grafting from his calcaneus and screws. This got infected he had to have more surgery on the area. Bone at the time of that surgery I think showed enterococcus and group B strep. He had 6 weeks of Rocephin. Since then the area has waxed and waned in its difficulty. Recently he had an MRI in December that did not show osteomyelitis nevertheless he had an abscess that grew MRSA and enterococcus which I elected to treat with Zyvox. This was in December and this wound is actually "healed" over 08/21/16; the patient came in today for his one-month follow-up visit. The area on the lateral aspect of his left foot looks much the same as some month ago. There is no evidence of an open wound here. However the patient tells me about a week after he went back to work he developed severe pain and swelling in the plantar aspect of his right foot first and fifth metatarsal heads. 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 He went to see his podiatrist at [facility]. She told him that he could not work on his feet. She told him to go back and his cam walking boot on the left. Not have open wounds obviously on the right. The patient is  actually gone ahead and retired from his job because he does not feel he can work on his feet 09/18/16; patient comes back in saying he recently had some pain on the lateral aspect of his left foot. Asked that we look at this. Other than that all of his wounds have a viable surface. He has diabetic shoes. He is [name], [name] ([id number]) retired from  work. Objective Constitutional Sitting or standing Blood Pressure is within target range for patient.. Pulse regular and within target range for patient.Marland Kitchen Respirations regular, non-labored and within target range.. Patient's appearance is neat and clean. Appears in no acute distress. Well nourished and well developed.. Vitals Time Taken: 10:38 AM, Height: 70 in, Weight: 260 lbs, BMI: 37.3, Temperature: 98.1 F, Pulse: 94 bpm, Respiratory Rate: 17 breaths/min, Blood Pressure: 153/75 mmHg. Cardiovascular Pedal pulses palpable and strong bilaterally.. General Notes: Would exam; there is no open area on the lateral aspect of his left foot. Thick callus however and I probed disability with a #5 curet although I can identify no open area here there appears to be a viable surface. No surrounding erythema. He also has thick callus on his fifth and first metatarsal heads on the right there is no open area here either Integumentary (Hair, Skin) Wound #7 status is Healed - Epithelialized. Original cause of wound was Gradually Appeared. The wound is located on the [place]. The wound measures 0cm length x 0cm width x 0cm depth; 0cm^2 area and 0cm^3 volume. The wound is limited to skin breakdown. There is no tunneling or undermining noted. There is a none present amount of drainage noted. The wound margin is distinct with the outline attached to the wound base. There is no granulation within the wound bed. There is a large (67- 100%) amount of necrotic tissue within the wound bed including Eschar. The periwound skin appearance exhibited: Callus, Dry/Scaly. The periwound skin appearance did not exhibit: Crepitus, Excoriation, Induration, Rash, Scarring, Maceration, Atrophie Blanche, Cyanosis, Ecchymosis, Hemosiderin Staining, Mottled, Pallor, Rubor, Erythema. Periwound temperature was noted as No Abnormality. The periwound has tenderness on palpation. Wound #8 status is Healed -  Epithelialized. Original cause of wound was Gradually Appeared. The wound is located on the [place]. The wound measures 0cm length x 0cm width x 0cm depth; 0cm^2 area and 0cm^3 volume. The wound is limited to skin breakdown. There is no tunneling or undermining noted. There is a none present amount of drainage noted. The wound margin is distinct with the outline attached to the wound base. There is no granulation within the wound bed. There is no necrotic tissue within the wound bed. The periwound skin appearance exhibited: Callus, Dry/Scaly. The periwound skin appearance did not exhibit: Crepitus, Excoriation, Induration, Rash, Scarring, Maceration, Atrophie Blanche, Cyanosis, Ecchymosis, Hemosiderin Staining, Mottled, Pallor, Rubor, Erythema. Periwound temperature was noted as No Abnormality. 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 Date of Service: 09/18/2016 Medical Record Patient Account Number: [id number] Number:  Treating RN: [name] Gouty, RN, BSN, [name] 10/23/1960 (56 y.o. Other Clinician: Date of Birth/Sex: Male) Treating [name] Primary Care Provider: Gavin [name] Provider/Extender: G Referring Provider: Gavin [name] Service Line: Outpatient Weeks in Treatment:  28 Diagnosis Coding ICD-10 Codes Code Description E11.621 Type 2 diabetes mellitus with foot ulcer L97.523 Non-pressure chronic ulcer of other part of left foot with necrosis of muscle Facility Procedures CPT4 Code: [id number] Description: 04888 - WOUND CARE VISIT-LEV 2 EST PT Modifier: Quantity: 1 Physician Procedures CPT4 Code Description: [id number] - WC PHYS LEVEL 2 - EST PT ICD-10 Description Diagnosis E11.621 Type 2 diabetes mellitus with foot ulcer L97.523 Non-pressure chronic ulcer of other part of left foo Modifier: t with necrosis Quantity: 1 of muscle Electronic Signature(s) Signed: 09/18/2016 4:09:31 PM By: Regan Lemming BSN, RN Signed: 09/18/2016 4:37:19 PM By: [name] Ham MD Entered By: Regan Lemming on 09/18/2016 16:09:30

## 2016-09-19 NOTE — Progress Notes (Signed)
RIMAS, GILHAM ([id number]) Visit Report for 09/18/2016 Arrival Information Details Patient Name: [name], [name]. Date of Service: 09/18/2016 10:30 AM Medical Record Number: [id number] Patient Account Number: [id number] Date of Birth/Sex: 1961/01/31 (56 y.o. Male) Treating RN: Baruch Gouty, RN, BSN, Velva Harman Primary Care Zedrick Springsteen: Gavin Pound Other Clinician: Referring Tiffiny Worthy: Gavin Pound Treating Presli Fanguy/Extender: Tito Dine in Treatment: 28 Visit Information History Since Last Visit All ordered tests and consults were completed: No Patient Arrived: Ambulatory Added or deleted any medications: No Arrival Time: 10:37 Any new allergies or adverse reactions: No Accompanied By: grdson Had a fall or experienced change in No Transfer Assistance: None activities of daily living that may affect Patient Identification Verified: Yes risk of falls: Secondary Verification Process Yes Signs or symptoms of abuse/neglect since last No Completed: visito Patient Requires Transmission-Based No Hospitalized since last visit: No Precautions: Has Dressing in Place as Prescribed: Yes Patient Has Alerts: Yes Pain Present Now: Yes Patient Alerts: DM II Electronic Signature(s) Signed: 09/18/2016 4:11:25 PM By: Regan Lemming BSN, RN Entered By: Regan Lemming on 09/18/2016 10:38:07 [name] ([id number]) -------------------------------------------------------------------------------- Clinic Level of Care Assessment Details Patient Name: [name]. Date of Service: 09/18/2016 10:30 AM Medical Record Number: [id number] Patient Account Number: [id number] Date of Birth/Sex: [birth year] (56 y.o. Male) Treating RN: Afful, RN, BSN, [employer] Primary Care Markayla Reichart: Gavin Pound Other Clinician: Referring Rickie Gange: Gavin Pound Treating Zebadiah Willert/Extender: Tito Dine in Treatment: 28 Clinic Level of Care Assessment Items TOOL 4 Quantity Score []  - Use when only an EandM is  performed on FOLLOW-UP visit 0 ASSESSMENTS - Nursing Assessment / Reassessment X - Reassessment of Co-morbidities (includes updates in patient status) 1 10 X - Reassessment of Adherence to Treatment Plan 1 5 ASSESSMENTS - Wound and Skin Assessment / Reassessment []  - Simple Wound Assessment / Reassessment - one wound 0 []  - Complex Wound Assessment / Reassessment - multiple wounds 0 []  - Dermatologic / Skin Assessment (not related to wound area) 0 ASSESSMENTS - Focused Assessment []  - Circumferential Edema Measurements - multi extremities 0 []  - Nutritional Assessment / Counseling / Intervention 0 []  - Lower Extremity Assessment (monofilament, tuning fork, pulses) 0 []  - Peripheral Arterial Disease Assessment (using hand held doppler) 0 ASSESSMENTS - Ostomy and/or Continence Assessment and Care []  - Incontinence Assessment and Management 0 []  - Ostomy Care Assessment and Management (repouching, etc.) 0 PROCESS - Coordination of Care X - Simple Patient / Family Education for ongoing care 1 15 []  - Complex (extensive) Patient / Family Education for ongoing care 0 []  - Staff obtains Programmer, systems, Records, Test Results / Process Orders 0 []  - Staff telephones HHA, Nursing Homes / Clarify orders / etc 0 []  - Routine Transfer to another Facility (non-emergent condition) 0 IHAN, PAT ([id number]) []  - Routine Hospital Admission (non-emergent condition) 0 []  - New Admissions / Biomedical engineer / Ordering NPWT, Apligraf, etc. 0 []  - Emergency Hospital Admission (emergent condition) 0 []  - Simple Discharge Coordination 0 []  - Complex (extensive) Discharge Coordination 0 PROCESS - Special Needs []  - Pediatric / Minor Patient Management 0 []  - Isolation Patient Management 0 []  - Hearing / Language / Visual special needs 0 []  - Assessment of Community assistance (transportation, D/C planning, etc.) 0 []  - Additional assistance / Altered mentation 0 []  - Support Surface(s) Assessment  (bed, cushion, seat, etc.) 0 INTERVENTIONS - Wound Cleansing / Measurement []  - Simple Wound Cleansing - one wound 0 []  - Complex Wound Cleansing - multiple  wounds 0 X - Wound Imaging (photographs - any number of wounds) 1 5 []  - Wound Tracing (instead of photographs) 0 []  - Simple Wound Measurement - one wound 0 []  - Complex Wound Measurement - multiple wounds 0 INTERVENTIONS - Wound Dressings []  - Small Wound Dressing one or multiple wounds 0 []  - Medium Wound Dressing one or multiple wounds 0 []  - Large Wound Dressing one or multiple wounds 0 []  - Application of Medications - topical 0 []  - Application of Medications - injection 0 INTERVENTIONS - Miscellaneous []  - External ear exam 0 TRENNON, TORBECK. ([id number]) []  - Specimen Collection (cultures, biopsies, blood, body fluids, etc.) 0 []  - Specimen(s) / Culture(s) sent or taken to Lab for analysis 0 []  - Patient Transfer (multiple staff / Harrel Lemon Lift / Similar devices) 0 []  - Simple Staple / Suture removal (25 or less) 0 []  - Complex Staple / Suture removal (26 or more) 0 []  - Hypo / Hyperglycemic Management (close monitor of Blood Glucose) 0 []  - Ankle / Brachial Index (ABI) - do not check if billed separately 0 X - Vital Signs 1 5 Has the patient been seen at the hospital within the last three years: Yes Total Score: 40 Level Of Care: New/Established - Level 2 Electronic Signature(s) Signed: 09/18/2016 4:11:25 PM By: Regan Lemming BSN, RN Entered By: Regan Lemming on 09/18/2016 16:09:18 [name] ([id number]) -------------------------------------------------------------------------------- Encounter Discharge Information Details Patient Name: [name]. Date of Service: 09/18/2016 10:30 AM Medical Record Number: [id number] Patient Account Number: [id number] Date of Birth/Sex: [birth year] (56 y.o. Male) Treating RN: Baruch Gouty, RN, BSN, Velva Harman Primary Care Charna Neeb: Gavin Pound Other Clinician: Referring Karlyn Glasco: Gavin Pound Treating Taliyah Watrous/Extender: Tito Dine in Treatment: 28 Encounter Discharge Information Items Discharge Pain Level: 0 Discharge Condition: Stable Ambulatory Status: Ambulatory Discharge Destination: Home Transportation: Private Auto Accompanied By: grd son Schedule Follow-up Appointment: No Medication Reconciliation completed and provided to Patient/Care No Lareen Mullings: Provided on Clinical Summary of Care: 09/18/2016 Form Type Recipient Paper Patient HP Electronic Signature(s) Signed: 09/18/2016 4:09:57 PM By: Regan Lemming BSN, RN Previous Signature: 09/18/2016 11:03:45 AM Version By: Ruthine Dose Entered By: Regan Lemming on 09/18/2016 16:09:56 [name] ([id number]) -------------------------------------------------------------------------------- Lower Extremity Assessment Details Patient Name: [name]. Date of Service: 09/18/2016 10:30 AM Medical Record Number: [id number] Patient Account Number: [id number] Date of Birth/Sex: December 07, 1960 (56 y.o. Male) Treating RN: Baruch Gouty, RN, BSN, Velva Harman Primary Care Desteny Freeman: Gavin Pound Other Clinician: Referring Afomia Blackley: Gavin Pound Treating Mike Berntsen/Extender: Tito Dine in Treatment: 28 Vascular Assessment Claudication: Claudication Assessment [Right:None] Pulses: Dorsalis Pedis Palpable: [Right:Yes] Posterior Tibial Extremity colors, hair growth, and conditions: Extremity Color: [Right:Hyperpigmented] Hair Growth on Extremity: [Right:No] Temperature of Extremity: [Right:Warm] Capillary Refill: [Right:< 3 seconds] Toe Nail Assessment Left: Right: Thick: Yes Discolored: Yes Deformed: Yes Improper Length and Hygiene: Yes Electronic Signature(s) Signed: 09/18/2016 4:11:25 PM By: Regan Lemming BSN, RN Entered By: Regan Lemming on 09/18/2016 10:47:49 [name] ([id number]) -------------------------------------------------------------------------------- Multi Wound Chart Details Patient  Name: [name]. Date of Service: 09/18/2016 10:30 AM Medical Record Number: [id number] Patient Account Number: [id number] Date of Birth/Sex: December 28, 1960 (56 y.o. Male) Treating RN: Baruch Gouty, RN, BSN, Velva Harman Primary Care Domnique Vanegas: Gavin Pound Other Clinician: Referring Larita Deremer: Gavin Pound Treating Garris Melhorn/Extender: Ricard Dillon Weeks in Treatment: 28 Vital Signs Height(in): 70 Pulse(bpm): 94 Weight(lbs): 260 Blood Pressure 153/75 (mmHg): Body Mass Index(BMI): 37 Temperature(F): 98.1 Respiratory Rate 17 (breaths/min): Photos: [7:No Photos] [8:No Photos] [N/A:N/A] Wound Location: [7:Right  Foot - Plantar, Medial] [8:Right Foot - Plantar, Lateral] [N/A:N/A] Wounding Event: [7:Gradually Appeared] [8:Gradually Appeared] [N/A:N/A] 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] [8:the Lower Extremity] [N/A:N/A] Comorbid History: [7:Hypertension, Type II Diabetes, Neuropathy] [8:Hypertension, Type II Diabetes, Neuropathy] [N/A:N/A] Date Acquired: [7:07/31/2016] [8:07/31/2016] [N/A:N/A] Weeks of Treatment: [7:4] [8:4] [N/A:N/A] Wound Status: [7:Healed - Epithelialized] [8:Healed - Epithelialized] [N/A:N/A] Measurements L x W x D 0x0x0 [8:0x0x0] [N/A:N/A] (cm) Area (cm) : [7:0] [8:0] [N/A:N/A] Volume (cm) : [7:0] [8:0] [N/A:N/A] % Reduction in Area: [7:100.00%] [8:100.00%] [N/A:N/A] % Reduction in Volume: 100.00% [8:100.00%] [N/A:N/A] Classification: [7:Unable to visualize wound Grade 0 bed] [N/A:N/A] Exudate Amount: [7:None Present] [8:None Present] [N/A:N/A] Wound Margin: [7:Distinct, outline attached Distinct, outline attached] [N/A:N/A] Granulation Amount: [7:None Present (0%)] [8:None Present (0%)] [N/A:N/A] Necrotic Amount: [7:Large (67-100%)] [8:None Present (0%)] [N/A:N/A] Necrotic Tissue: [7:Eschar] [8:N/A] [N/A:N/A] Exposed Structures: [7:Fascia: No Fat Layer (Subcutaneous Fat Layer (Subcutaneous Tissue) Exposed: No  Tendon: No Muscle: No Joint: No] [8:Fascia: No Tissue) Exposed: No Tendon: No Muscle: No Joint: No] [N/A:N/A] Bone: No Bone: No Limited to Skin Limited to Skin Breakdown Breakdown Epithelialization: None None N/A Periwound Skin Texture: Callus: Yes Callus: Yes N/A Excoriation: No Excoriation: No Induration: No Induration: No Crepitus: No Crepitus: No Rash: No Rash: No Scarring: No Scarring: No Periwound Skin Dry/Scaly: Yes Dry/Scaly: Yes N/A Moisture: Maceration: No Maceration: No Periwound Skin Color: Atrophie Blanche: No Atrophie Blanche: No N/A Cyanosis: No Cyanosis: No Ecchymosis: No Ecchymosis: No Erythema: No Erythema: No Hemosiderin Staining: No Hemosiderin Staining: No Mottled: No Mottled: No Pallor: No Pallor: No Rubor: No Rubor: No Temperature: No Abnormality No Abnormality N/A Tenderness on Yes No N/A Palpation: Wound Preparation: Ulcer Cleansing: Ulcer Cleansing: N/A Rinsed/Irrigated with Rinsed/Irrigated with Saline Saline Topical Anesthetic Topical Anesthetic Applied: None Applied: None Treatment Notes Electronic Signature(s) Signed: 09/18/2016 4:37:19 PM By: [name] MD Entered By: [name] on 09/18/2016 12:21:05 [name] ([id number]) -------------------------------------------------------------------------------- [place] Details Patient Name: RONDAL, VANDEVELDE. Date of Service: 09/18/2016 10:30 AM Medical Record Number: [id number] Patient Account Number: [id number] Date of Birth/Sex: [birth year] (56 y.o. Male) Treating RN: Baruch Gouty, RN, BSN, [employer] Primary Care Yazaira Speas: Gavin Pound Other Clinician: Referring Reid Regas: Gavin Pound Treating Zayanna Pundt/Extender: Ricard Dillon Weeks in Treatment: 28 Active Inactive Electronic Signature(s) Signed: 09/18/2016 4:08:46 PM By: Regan Lemming BSN, RN Entered By: Regan Lemming on 09/18/2016 16:08:45 [name]  ([id number]) -------------------------------------------------------------------------------- Pain Assessment Details Patient Name: TIMM, BONENBERGER. Date of Service: 09/18/2016 10:30 AM Medical Record Number: [id number] Patient Account Number: [id number] Date of Birth/Sex: 01-13-1961 (56 y.o. Male) Treating RN: Baruch Gouty, RN, BSN, Velva Harman Primary Care Wilkie Zenon: Gavin Pound Other Clinician: Referring Margurette Brener: Gavin Pound Treating Dawood Spitler/Extender: Tito Dine in Treatment: 28 Active Problems Location of Pain Severity and Description of Pain Patient Has Paino Yes Site Locations Pain Location: Pain in Ulcers Rate the pain. Current Pain Level: 3 Character of Pain Describe the Pain: Aching Pain Management and Medication Current Pain Management: Electronic Signature(s) Signed: 09/18/2016 4:11:25 PM By: Regan Lemming BSN, RN Entered By: Regan Lemming on 09/18/2016 10:38:24 [name] ([id number]) -------------------------------------------------------------------------------- Patient/Caregiver Education Details Patient Name: HAYWOOD, MEINDERS. Date of Service: 09/18/2016 10:30 AM Medical Record Patient Account Number: [id number] Number: Treating RN: Baruch Gouty, RN, BSN, Rita 1960/12/15 (56 y.o. Other Clinician: Date of Birth/Gender: Male) Treating ROBSON, MICHAEL Primary Care Physician: Gavin Pound Physician/Extender: G Referring Physician: Judeth Horn in Treatment: 58 Education Assessment Education Provided To: Patient Education Topics Provided Electronic  Signature(s) Signed: 09/18/2016 4:11:25 PM By: Regan Lemming BSN, RN Entered By: Regan Lemming on 09/18/2016 16:10:02 [name] ([id number]) -------------------------------------------------------------------------------- Wound Assessment Details Patient Name: LEDGER, HEINDL. Date of Service: 09/18/2016 10:30 AM Medical Record Number: [id number] Patient Account Number: [id number] Date of Birth/Sex:  09/02/1960 (56 y.o. Male) Treating RN: Afful, RN, BSN, [place] Primary Care Lillyahna Hemberger: Gavin Pound Other Clinician: Referring Marrio Scribner: Gavin Pound Treating Raequan Vanschaick/Extender: Tito Dine in Treatment: 28 Wound Status Wound Number: 7 Primary Diabetic Wound/Ulcer of the Lower Etiology: Extremity Wound Location: Right Foot - Plantar, Medial Wound Status: Healed - Epithelialized Wounding Event: Gradually Appeared Comorbid Hypertension, Type II Diabetes, Date Acquired: 07/31/2016 History: Neuropathy Weeks Of Treatment: 4 Clustered Wound: No Photos Photo Uploaded By: Regan Lemming on 09/18/2016 16:39:44 Wound Measurements Length: (cm) 0 % Reduction in Width: (cm) 0 % Reduction in Depth: (cm) 0 Epithelializat Area: (cm) 0 Tunneling: Volume: (cm) 0 Undermining: Area: 100% Volume: 100% ion: None No No Wound Description Classification: Unable to visualize wound bed Wound Margin: Distinct, outline attached Exudate Amount: None Present Foul Odor After Cleansing: No Slough/Fibrino No Wound Bed Granulation Amount: None Present (0%) Exposed Structure Necrotic Amount: Large (67-100%) Fascia Exposed: No Necrotic Quality: Eschar Fat Layer (Subcutaneous Tissue) Exposed: No Tendon Exposed: No Muscle Exposed: No Joint Exposed: No OSAMA, COLESON ([id number]) Bone Exposed: No Limited to Skin Breakdown Periwound Skin Texture Texture Color No Abnormalities Noted: No No Abnormalities Noted: No Callus: Yes Atrophie Blanche: No Crepitus: No Cyanosis: No Excoriation: No Ecchymosis: No Induration: No Erythema: No Rash: No Hemosiderin Staining: No Scarring: No Mottled: No Pallor: No Moisture Rubor: No No Abnormalities Noted: No Dry / Scaly: Yes Temperature / Pain Maceration: No Temperature: No Abnormality Tenderness on Palpation: Yes Wound Preparation Ulcer Cleansing: Rinsed/Irrigated with Saline Topical Anesthetic Applied: None Electronic  Signature(s) Signed: 09/18/2016 4:11:25 PM By: Regan Lemming BSN, RN Entered By: Regan Lemming on 09/18/2016 10:57:00 [name] ([id number]) -------------------------------------------------------------------------------- Wound Assessment Details Patient Name: [name]. Date of Service: 09/18/2016 10:30 AM Medical Record Number: [id number] Patient Account Number: [id number] Date of Birth/Sex: 23-Jun-1961 (56 y.o. Male) Treating RN: Afful, RN, BSN, [employer] Primary Care Arlis Yale: Gavin Pound Other Clinician: Referring Anagha Loseke: Gavin Pound Treating Javonda Suh/Extender: Tito Dine in Treatment: 28 Wound Status Wound Number: 8 Primary Diabetic Wound/Ulcer of the Lower Etiology: Extremity Wound Location: Right Foot - Plantar, Lateral Wound Status: Healed - Epithelialized Wounding Event: Gradually Appeared Comorbid Hypertension, Type II Diabetes, Date Acquired: 07/31/2016 History: Neuropathy Weeks Of Treatment: 4 Clustered Wound: No Photos Photo Uploaded By: Regan Lemming on 09/18/2016 16:40:03 Wound Measurements Length: (cm) 0 % Reductio Width: (cm) 0 % Reductio Depth: (cm) 0 Epithelial Area: (cm) 0 Tunneling Volume: (cm) 0 Undermini n in Area: 100% n in Volume: 100% ization: None : No ng: No Wound Description Classification: Grade 0 Wound Margin: Distinct, outline attached Exudate Amount: None Present Foul Odor After Cleansing: No Slough/Fibrino No Wound Bed Granulation Amount: None Present (0%) Exposed Structure Necrotic Amount: None Present (0%) Fascia Exposed: No Fat Layer (Subcutaneous Tissue) Exposed: No Tendon Exposed: No Muscle Exposed: No Joint Exposed: No JAYSEAN, [place]. ([id number]) Bone Exposed: No Limited to Skin Breakdown Periwound Skin Texture Texture Color No Abnormalities Noted: No No Abnormalities Noted: No Callus: Yes Atrophie Blanche: No Crepitus: No Cyanosis: No Excoriation: No Ecchymosis: No Induration: No Erythema:  No Rash: No Hemosiderin Staining: No Scarring: No Mottled: No Pallor: No Moisture Rubor: No No Abnormalities Noted: No Dry / Scaly: Yes  Temperature / Pain Maceration: No Temperature: No Abnormality Wound Preparation Ulcer Cleansing: Rinsed/Irrigated with Saline Topical Anesthetic Applied: None Electronic Signature(s) Signed: 09/18/2016 4:11:25 PM By: Regan Lemming BSN, RN Entered By: Regan Lemming on 09/18/2016 11:02:50 [name] ([id number]) -------------------------------------------------------------------------------- [place] Details Patient Name: [name]. Date of Service: 09/18/2016 10:30 AM Medical Record Number: [id number] Patient Account Number: [id number] Date of Birth/Sex: 1960-09-21 (56 y.o. Male) Treating RN: Afful, RN, BSN, [place] Primary Care Calia Napp: Gavin Pound Other Clinician: Referring Giovanie Lefebre: Gavin Pound Treating Tashya Alberty/Extender: Tito Dine in Treatment: 28 Vital Signs Time Taken: 10:38 Temperature (F): 98.1 Height (in): 70 Pulse (bpm): 94 Weight (lbs): 260 Respiratory Rate (breaths/min): 17 Body Mass Index (BMI): 37.3 Blood Pressure (mmHg): 153/75 Reference Range: 80 - 120 mg / dl Electronic Signature(s) Signed: 09/18/2016 4:11:25 PM By: Regan Lemming BSN, RN Entered By: Regan Lemming on 09/18/2016 10:40:20

## 2017-02-05 IMAGING — CR DG FOOT 2V*L*
1 series · 2 of 2 positions shown · non-contrast
Comparison: Left foot series of April 14, 2016 and MRI of the
foot May 05, 2016.

CLINICAL DATA: Nonhealing wound over the lateral aspect of the left
foot. Patient had a fracture in 6109 and had subsequent hardware
removal with non healing of the surgical wound. The foot is
bandaged.

EXAM:
LEFT FOOT - 2 VIEW

[Series 1: dg foot 2 views left · 0.14mm/px · 2 of 2 slices shown]
[im 1/2]
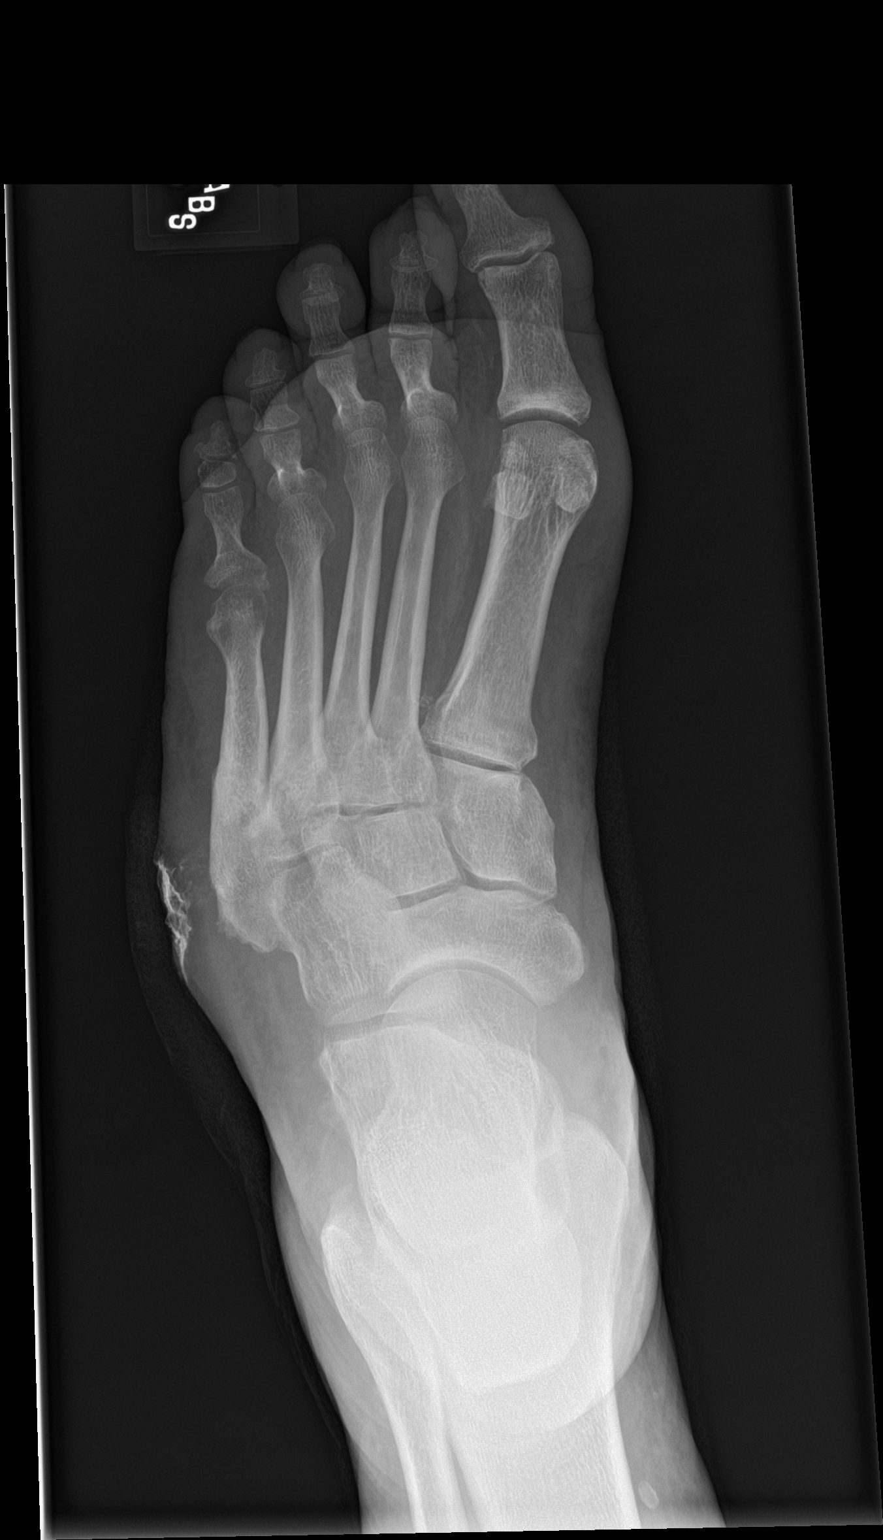
[im 2/2]
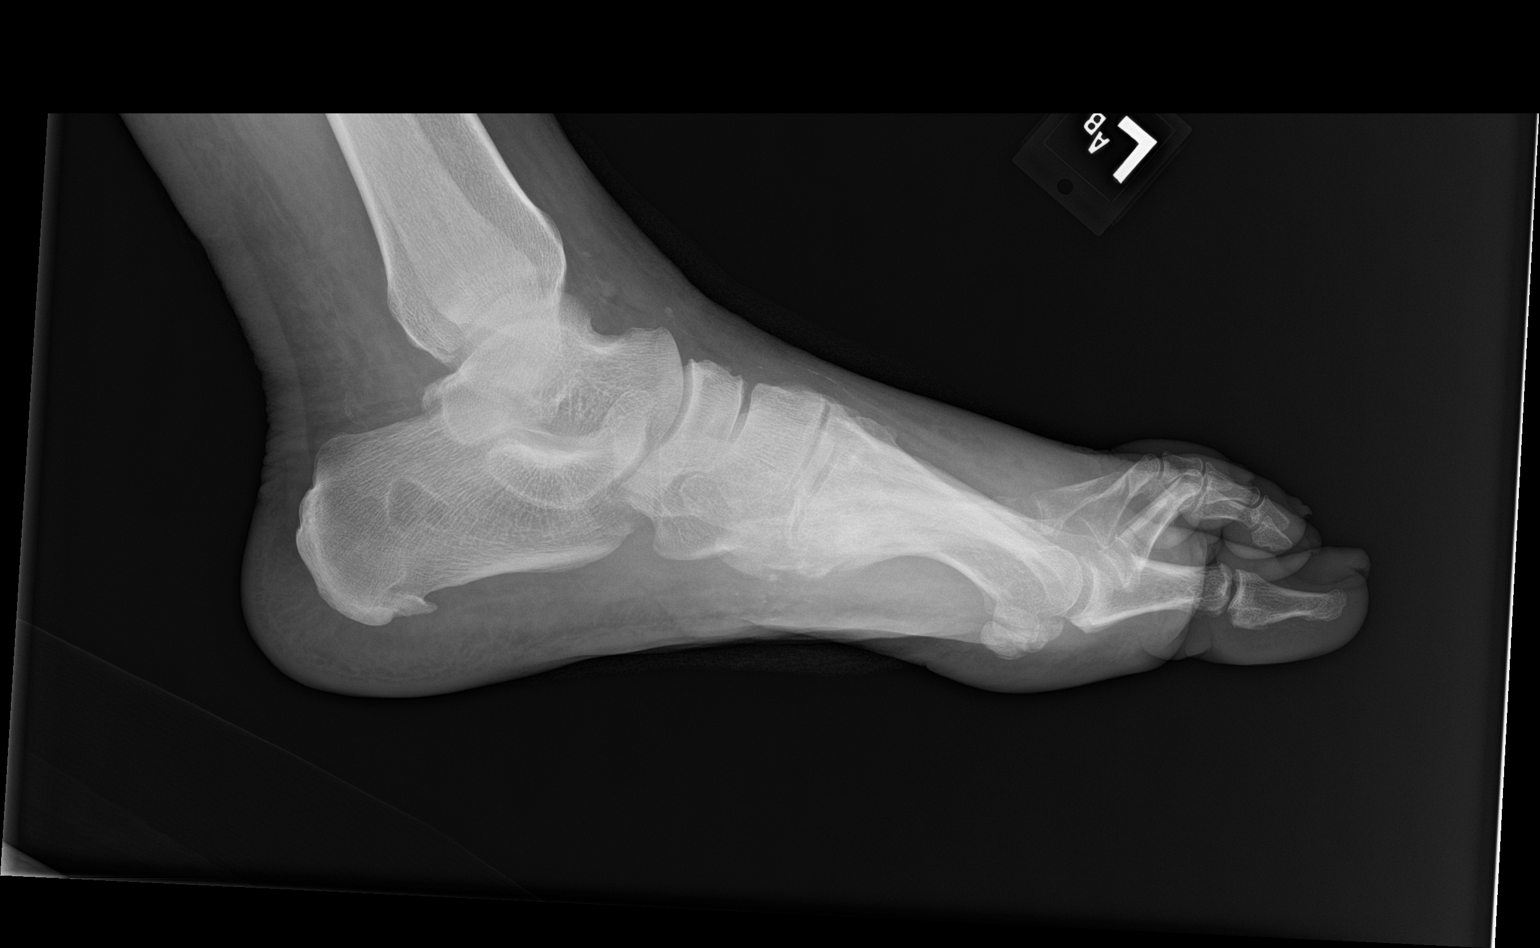

[2 of 2 positions shown; findings below may reference images not displayed]

FINDINGS: The bones are subjectively adequately mineralized. Specific
attention to the base of the fifth metatarsal reveals no lytic or
blastic lesion or evidence of erosive change. There is radiodense
material in the soft tissues overlying the lateral aspect of the
base of the fifth metatarsal. No soft tissue gas collections are
observed. The other metatarsals are unremarkable as are the
phalanges. The tarsal bones appear intact. There is a stable
well-circumscribed sclerotic marginated lucency in the body of the
calcaneus. Slightly decreased soft tissue swelling over the base of
the fifth metatarsal.
IMPRESSION: Stable appearance of the fifth metatarsal. No findings suspicious
for osteomyelitis. Slightly decreased soft tissue thickening over
the lateral aspect of the metatarsal region.

## 2017-03-05 ENCOUNTER — Emergency Department: Payer: Commercial Managed Care - PPO

## 2017-03-05 ENCOUNTER — Inpatient Hospital Stay: Payer: Commercial Managed Care - PPO

## 2017-03-05 ENCOUNTER — Encounter: Payer: Self-pay | Admitting: *Deleted

## 2017-03-05 ENCOUNTER — Inpatient Hospital Stay
Admission: EM | Admit: 2017-03-05 | Discharge: 2017-03-07 | DRG: 623 | Disposition: A | Discharge status: Home / Self Care | Payer: Commercial Managed Care - PPO | Attending: Internal Medicine | Admitting: Internal Medicine

## 2017-03-05 DIAGNOSIS — Z79899 Other long term (current) drug therapy: Secondary | ICD-10-CM

## 2017-03-05 DIAGNOSIS — M1991 Primary osteoarthritis, unspecified site: Secondary | ICD-10-CM | POA: Diagnosis present

## 2017-03-05 DIAGNOSIS — E1142 Type 2 diabetes mellitus with diabetic polyneuropathy: Secondary | ICD-10-CM | POA: Diagnosis present

## 2017-03-05 DIAGNOSIS — I10 Essential (primary) hypertension: Secondary | ICD-10-CM | POA: Diagnosis present

## 2017-03-05 DIAGNOSIS — S81812A Laceration without foreign body, left lower leg, initial encounter: Secondary | ICD-10-CM | POA: Diagnosis present

## 2017-03-05 DIAGNOSIS — G4730 Sleep apnea, unspecified: Secondary | ICD-10-CM | POA: Diagnosis present

## 2017-03-05 DIAGNOSIS — L03116 Cellulitis of left lower limb: Secondary | ICD-10-CM | POA: Diagnosis present

## 2017-03-05 DIAGNOSIS — E11621 Type 2 diabetes mellitus with foot ulcer: Secondary | ICD-10-CM | POA: Diagnosis present

## 2017-03-05 DIAGNOSIS — Y92002 Bathroom of unspecified non-institutional (private) residence single-family (private) house as the place of occurrence of the external cause: Secondary | ICD-10-CM

## 2017-03-05 DIAGNOSIS — L97529 Non-pressure chronic ulcer of other part of left foot with unspecified severity: Secondary | ICD-10-CM | POA: Diagnosis present

## 2017-03-05 DIAGNOSIS — W182XXA Fall in (into) shower or empty bathtub, initial encounter: Secondary | ICD-10-CM | POA: Diagnosis present

## 2017-03-05 DIAGNOSIS — Z7982 Long term (current) use of aspirin: Secondary | ICD-10-CM

## 2017-03-05 DIAGNOSIS — Z833 Family history of diabetes mellitus: Secondary | ICD-10-CM | POA: Diagnosis not present

## 2017-03-05 DIAGNOSIS — L02612 Cutaneous abscess of left foot: Secondary | ICD-10-CM | POA: Diagnosis present

## 2017-03-05 DIAGNOSIS — Z8614 Personal history of Methicillin resistant Staphylococcus aureus infection: Secondary | ICD-10-CM

## 2017-03-05 DIAGNOSIS — Z794 Long term (current) use of insulin: Secondary | ICD-10-CM

## 2017-03-05 LAB — COMPREHENSIVE METABOLIC PANEL
ALBUMIN: 3.1 g/dL — AB (ref 3.5–5.0)
ALK PHOS: 107 U/L (ref 38–126)
ALT: 66 U/L — AB (ref 17–63)
AST: 37 U/L (ref 15–41)
Anion gap: 7 (ref 5–15)
BILIRUBIN TOTAL: 0.4 mg/dL (ref 0.3–1.2)
BUN: 15 mg/dL (ref 6–20)
CALCIUM: 8.7 mg/dL — AB (ref 8.9–10.3)
CO2: 26 mmol/L (ref 22–32)
CREATININE: 1.02 mg/dL (ref 0.61–1.24)
Chloride: 105 mmol/L (ref 101–111)
GFR calc Af Amer: >60 mL/min (ref >60)
GFR calc non Af Amer: >60 mL/min (ref >60)
GLUCOSE: 173 mg/dL — AB (ref 65–99)
Potassium: 4 mmol/L (ref 3.5–5.1)
SODIUM: 138 mmol/L (ref 135–145)
TOTAL PROTEIN: 6.7 g/dL (ref 6.5–8.1)

## 2017-03-05 LAB — CBC WITH DIFFERENTIAL/PLATELET
BASOS PCT: 1 %
Basophils Absolute: 0.1 10*3/uL (ref 0–0.1)
EOS ABS: 0.1 10*3/uL (ref 0–0.7)
EOS PCT: 1 %
HCT: 41.3 % (ref 40.0–52.0)
Hemoglobin: 14.2 g/dL (ref 13.0–18.0)
Lymphocytes Relative: 21 %
Lymphs Abs: 2.4 10*3/uL (ref 1.0–3.6)
MCH: 30.9 pg (ref 26.0–34.0)
MCHC: 34.3 g/dL (ref 32.0–36.0)
MCV: 90.1 fL (ref 80.0–100.0)
MONO ABS: 1.1 10*3/uL — AB (ref 0.2–1.0)
MONOS PCT: 9 %
Neutro Abs: 7.6 10*3/uL — ABNORMAL HIGH (ref 1.4–6.5)
Neutrophils Relative %: 68 %
PLATELETS: 214 10*3/uL (ref 150–440)
RBC: 4.59 MIL/uL (ref 4.40–5.90)
RDW: 13.9 % (ref 11.5–14.5)
WBC: 11.4 10*3/uL — ABNORMAL HIGH (ref 3.8–10.6)

## 2017-03-05 LAB — GLUCOSE, CAPILLARY
GLUCOSE-CAPILLARY: 94 mg/dL (ref 65–99)
Glucose-Capillary: 147 mg/dL — ABNORMAL HIGH (ref 65–99)

## 2017-03-05 LAB — HEMOGLOBIN A1C
Hgb A1c MFr Bld: 6.9 % — ABNORMAL HIGH (ref 4.8–5.6)
MEAN PLASMA GLUCOSE: 151.33 mg/dL

## 2017-03-05 MED ORDER — SIMVASTATIN 20 MG PO TABS
20.0000 mg | ORAL_TABLET | Freq: Every evening | ORAL | Status: DC
  Administered 2017-03-05 – 2017-03-06 (×2): 20 mg via ORAL
  Filled 2017-03-05 (×2): qty 1

## 2017-03-05 MED ORDER — DOCUSATE SODIUM 100 MG PO CAPS
100.0000 mg | ORAL_CAPSULE | Freq: Two times a day (BID) | ORAL | Status: DC
  Administered 2017-03-05 – 2017-03-07 (×2): 100 mg via ORAL
  Filled 2017-03-05 (×4): qty 1

## 2017-03-05 MED ORDER — LINAGLIPTIN 5 MG PO TABS
5.0000 mg | ORAL_TABLET | Freq: Every day | ORAL | Status: DC
  Administered 2017-03-06 – 2017-03-07 (×2): 5 mg via ORAL
  Filled 2017-03-05 (×2): qty 1

## 2017-03-05 MED ORDER — ACETAMINOPHEN 325 MG PO TABS: 650.0000 mg | ORAL_TABLET | Freq: Four times a day (QID) | ORAL | Status: DC | PRN

## 2017-03-05 MED ORDER — ACETAMINOPHEN 650 MG RE SUPP: 650.0000 mg | Freq: Four times a day (QID) | RECTAL | Status: DC | PRN

## 2017-03-05 MED ORDER — GABAPENTIN 400 MG PO CAPS
400.0000 mg | ORAL_CAPSULE | Freq: Two times a day (BID) | ORAL | Status: DC
  Administered 2017-03-05 – 2017-03-07 (×4): 400 mg via ORAL
  Filled 2017-03-05 (×4): qty 1

## 2017-03-05 MED ORDER — ALBUTEROL SULFATE (2.5 MG/3ML) 0.083% IN NEBU: 2.5000 mg | INHALATION_SOLUTION | RESPIRATORY_TRACT | Status: DC | PRN

## 2017-03-05 MED ORDER — LOSARTAN POTASSIUM 50 MG PO TABS
100.0000 mg | ORAL_TABLET | Freq: Every day | ORAL | Status: DC
  Administered 2017-03-05 – 2017-03-07 (×3): 100 mg via ORAL
  Filled 2017-03-05 (×3): qty 2

## 2017-03-05 MED ORDER — OXYCODONE HCL 5 MG PO TABS
ORAL_TABLET | ORAL | Status: AC
  Administered 2017-03-05: 5 mg
  Filled 2017-03-05: qty 1

## 2017-03-05 MED ORDER — POLYETHYLENE GLYCOL 3350 17 G PO PACK: 17.0000 g | PACK | Freq: Every day | ORAL | Status: DC | PRN

## 2017-03-05 MED ORDER — INSULIN ASPART 100 UNIT/ML ~~LOC~~ SOLN
0.0000 [IU] | Freq: Three times a day (TID) | SUBCUTANEOUS | Status: DC
  Administered 2017-03-06 (×2): 2 [IU] via SUBCUTANEOUS
  Administered 2017-03-07: 3 [IU] via SUBCUTANEOUS
  Administered 2017-03-07: 2 [IU] via SUBCUTANEOUS
  Filled 2017-03-05 (×4): qty 1

## 2017-03-05 MED ORDER — BISACODYL 10 MG RE SUPP
10.0000 mg | Freq: Every day | RECTAL | Status: DC | PRN
  Filled 2017-03-05: qty 1

## 2017-03-05 MED ORDER — BASAGLAR KWIKPEN 100 UNIT/ML ~~LOC~~ SOPN: 30.0000 [IU] | PEN_INJECTOR | Freq: Every day | SUBCUTANEOUS | Status: DC

## 2017-03-05 MED ORDER — VANCOMYCIN HCL 10 G IV SOLR
1500.0000 mg | Freq: Two times a day (BID) | INTRAVENOUS | Status: DC
  Administered 2017-03-06 – 2017-03-07 (×3): 1500 mg via INTRAVENOUS
  Filled 2017-03-05 (×4): qty 1500

## 2017-03-05 MED ORDER — ONDANSETRON HCL 4 MG/2ML IJ SOLN: 4.0000 mg | Freq: Four times a day (QID) | INTRAMUSCULAR | Status: DC | PRN

## 2017-03-05 MED ORDER — VANCOMYCIN HCL IN DEXTROSE 1-5 GM/200ML-% IV SOLN: 1000.0000 mg | Freq: Once | INTRAVENOUS | Status: DC

## 2017-03-05 MED ORDER — CIPROFLOXACIN IN D5W 400 MG/200ML IV SOLN
400.0000 mg | Freq: Once | INTRAVENOUS | Status: AC
  Administered 2017-03-05: 400 mg via INTRAVENOUS
  Filled 2017-03-05: qty 200

## 2017-03-05 MED ORDER — ENOXAPARIN SODIUM 40 MG/0.4ML ~~LOC~~ SOLN
40.0000 mg | SUBCUTANEOUS | Status: DC
  Administered 2017-03-05 – 2017-03-06 (×2): 40 mg via SUBCUTANEOUS
  Filled 2017-03-05 (×2): qty 0.4

## 2017-03-05 MED ORDER — ASPIRIN EC 81 MG PO TBEC
81.0000 mg | DELAYED_RELEASE_TABLET | Freq: Every day | ORAL | Status: DC
  Administered 2017-03-06 – 2017-03-07 (×2): 81 mg via ORAL
  Filled 2017-03-05 (×2): qty 1

## 2017-03-05 MED ORDER — HYDROCHLOROTHIAZIDE 25 MG PO TABS
25.0000 mg | ORAL_TABLET | Freq: Every day | ORAL | Status: DC
  Administered 2017-03-05 – 2017-03-07 (×3): 25 mg via ORAL
  Filled 2017-03-05 (×3): qty 1

## 2017-03-05 MED ORDER — INSULIN GLARGINE 100 UNIT/ML ~~LOC~~ SOLN
30.0000 [IU] | Freq: Every day | SUBCUTANEOUS | Status: DC
  Administered 2017-03-05 – 2017-03-06 (×2): 30 [IU] via SUBCUTANEOUS
  Filled 2017-03-05 (×3): qty 0.3

## 2017-03-05 MED ORDER — CIPROFLOXACIN IN D5W 400 MG/200ML IV SOLN
400.0000 mg | Freq: Two times a day (BID) | INTRAVENOUS | Status: DC
  Administered 2017-03-06 – 2017-03-07 (×3): 400 mg via INTRAVENOUS
  Filled 2017-03-05 (×5): qty 200

## 2017-03-05 MED ORDER — OXYCODONE HCL 5 MG PO TABS
5.0000 mg | ORAL_TABLET | ORAL | Status: DC | PRN
  Administered 2017-03-06 – 2017-03-07 (×3): 5 mg via ORAL
  Filled 2017-03-05 (×3): qty 1

## 2017-03-05 MED ORDER — LOSARTAN POTASSIUM-HCTZ 100-25 MG PO TABS: 1.0000 | ORAL_TABLET | Freq: Every day | ORAL | Status: DC

## 2017-03-05 MED ORDER — INSULIN ASPART 100 UNIT/ML ~~LOC~~ SOLN: 0.0000 [IU] | Freq: Every day | SUBCUTANEOUS | Status: DC

## 2017-03-05 MED ORDER — VANCOMYCIN HCL 10 G IV SOLR
1500.0000 mg | Freq: Two times a day (BID) | INTRAVENOUS | Status: DC
  Filled 2017-03-05 (×2): qty 1500

## 2017-03-05 MED ORDER — SENNA 8.6 MG PO TABS
2.0000 | ORAL_TABLET | Freq: Two times a day (BID) | ORAL | Status: DC
  Administered 2017-03-05 – 2017-03-07 (×2): 17.2 mg via ORAL
  Filled 2017-03-05 (×4): qty 2

## 2017-03-05 MED ORDER — ONDANSETRON HCL 4 MG PO TABS: 4.0000 mg | ORAL_TABLET | Freq: Four times a day (QID) | ORAL | Status: DC | PRN

## 2017-03-05 NOTE — Consult Note (Signed)
Patient Demographics  [name], is a 56 y.o. male   MRN: [id number]   DOB - May 23, 1961  Admit Date - 03/05/2017    Outpatient Primary MD for the patient is Gavin Pound, MD  Consult requested in the Hospital by Hinda Kehr, MD, On 03/05/2017    Reason for consult drainage and infection to the left foot   With History of -  Past Medical History:  Diagnosis Date  . Arthritis   . BPH (benign prostatic hypertrophy)   . Diabetes mellitus   . Diabetic neuropathy ([name])   . Hypertension   . Metatarsal bone fracture    left 5th toe  . Osteomyelitis of toe of right foot ([place])   . Post-operative infection    and diabetic ulcer left foot  . Sleep apnea     does not wear CPAP  . Wears glasses       Past Surgical History:  Procedure Laterality Date  . COLONOSCOPY W/ BIOPSIES AND POLYPECTOMY    . I&D EXTREMITY Left 01/11/2016   Procedure: LEFT FOOT IRRIGATION AND DEBRIDEMENT WOUND VAC AND REMOVAL OF HARDWARE;  Surgeon: Wylene Simmer, MD;  Location: [facility];  Service: Orthopedics;  Laterality: Left;  . ORIF TOE FRACTURE Left 06/08/2015   Procedure: OPEN REDUCTION INTERNAL FIXATION (ORIF) LEFT FIFTH METATARSAL BASE FRACTURE NONUNION; CALCANEAL AUTOGRAFT ;  Surgeon: Wylene Simmer, MD;  Location: [facility];  Service: Orthopedics;  Laterality: Left;  . PATELLA RECONSTRUCTION Left 2005  . PROSTATE ABLATION  2014  . TOE AMPUTATION     partial amputation right great toe    in for   Chief Complaint  Patient presents with  . Foot Pain     HPI  [name]  is a 56 y.o. male, patient came to the emergency room today at the direction of a podiatrist in [place] because of a infected wound to the left foot. He has a history of previous wound on the same area of the left foot last year when  he had a MRSA infection osteomyelitis and wound VAC treatment as well as surgical debridement. He states he started having trouble with about a week ago. His podiatrist routinely debrided callus tissue across the region this time it has not gotten worse.    Review of Systems    In addition to the HPI above,  No Fever-chills, No Headache, No changes with Vision or hearing, No problems swallowing food or Liquids, No Chest pain, Cough or Shortness of Breath, No Abdominal pain, No Nausea or Vommitting, Bowel movements are regular, No Blood in stool or Urine, No dysuria, No new skin rashes or bruises, No new joints pains-aches,  No new weakness, tingling, numbness in any extremity, No recent weight gain or loss, No polyuria, polydypsia or polyphagia, No significant Mental Stressors.  A full 10 point Review of Systems was done, except as stated above, all other Review of Systems were negative.   Social History Social History  Substance Use Topics  . Smoking status: Never Smoker  . Smokeless tobacco: Never Used  . Alcohol use Yes     Comment: occasional      Family History Family  History  Problem Relation Age of Onset  . Diabetes Mother   . Diabetes Other      Prior to Admission medications   Medication Sig Start Date End Date Taking? Authorizing Provider  aspirin EC 81 MG tablet Take 81 mg by mouth daily.    [provider]  docusate sodium (COLACE) 100 MG capsule Take 1 capsule (100 mg total) by mouth 2 (two) times daily. While taking narcotic pain medicine. 01/12/16   Corky Sing, PA-C  Insulin Glargine (BASAGLAR KWIKPEN) 100 UNIT/ML SOPN Inject 0.35 mLs (35 Units total) into the skin at bedtime. 04/30/16   Renato Shin, MD  insulin lispro (HUMALOG KWIKPEN) 100 UNIT/ML KiwkPen Inject 0.15 mLs (15 Units total) into the skin 3 (three) times daily with meals. And pen needles 4/day 04/30/16   Renato Shin, MD  losartan-hydrochlorothiazide (HYZAAR) 100-25 MG  tablet Take 1 tablet by mouth daily. 12/24/15   [provider]  metFORMIN (GLUCOPHAGE) 1000 MG tablet Take 1,000 mg by mouth 2 (two) times daily. 10/21/15   [provider]  multivitamin-iron-minerals-folic acid (CENTRUM) chewable tablet Chew 1 tablet by mouth daily.    [provider]  oxyCODONE (ROXICODONE) 5 MG immediate release tablet Take 1-2 tablets (5-10 mg total) by mouth every 4 (four) hours as needed for moderate pain or severe pain. 01/12/16   Corky Sing, PA-C  senna (SENOKOT) 8.6 MG TABS tablet Take 2 tablets (17.2 mg total) by mouth 2 (two) times daily. 01/12/16   Corky Sing, PA-C  simvastatin (ZOCOR) 20 MG tablet TAKE 1 TABLET BY MOUTH IN THE EVENING 05/02/16   Renato Shin, MD  tamsulosin (FLOMAX) 0.4 MG CAPS capsule Take 0.4 mg by mouth daily.     [provider]    Anti-infectives    None      Scheduled Meds: Continuous Infusions: PRN Meds:.  Allergies  Allergen Reactions  . No Known Allergies     Physical Exam  Vitals  Blood pressure (!) 142/73, pulse 94, temperature 98.1 F (36.7 C), temperature source Oral, resp. rate 18, height 5\' 10"  (1.778 m), weight 122.5 kg (270 lb), SpO2 96 %.  Lower Extremity exam:  Vascular:I'll difficult time palpating pulses on the foot but he states she's had a recent noninvasive studies over the last year that were described to him as being good. Capillary refill times R about 3-4 seconds to the toes.  Dermatological:patient has a draining wound on the plantar lateral fifth metatarsal tuberosity area of the left foot. Not a lot of heavy purulence but bleeding to the region but some purulence to drain from the region earlier. There is area of fluctuance progressing dorsally from there that is underneath the callus tissue and scar tissue that had developed from previous healed wound. Does not appear to have cellulitis to extensive from the wound. Some surrounding erythema and redness but  no progressive cellulitis proximally. Wound is about 3.5 cm in length and about 1.8 cm in width with approximately 5 mm in depth.  Neurological:patient does have peripheral neuropathy associated with diabetes needs completely numb along the dorsolateral aspect of his foot but that plantar lateral wound he feels pain with.  Ortho:patient is very prominent fifth metatarsal tuberosity E states he does have some molded inserts that he wears with some extra depth shoes but still in the developing some pressure and irritation across this region.  Data Review  CBC  Recent Labs Lab 03/05/17 1521  WBC 11.4*  HGB 14.2  HCT 41.3  PLT 214  MCV 90.1  MCH 30.9  MCHC 34.3  RDW 13.9  LYMPHSABS 2.4  MONOABS 1.1*  EOSABS 0.1  BASOSABS 0.1   ------------------------------------------------------------------------------------------------------------------  Chemistries   Recent Labs Lab 03/05/17 1521  NA 138  K 4.0  CL 105  CO2 26  GLUCOSE 173*  BUN 15  CREATININE 1.02  CALCIUM 8.7*  AST 37  ALT 66*  ALKPHOS 107  BILITOT 0.4   ------------------------------------------------------------------------------------------------------------------ estimated creatinine clearance is 107.4 mL/min (by C-G formula based on SCr of 1.02 mg/dL). ------------------------------------------------------------------------------------------ Assessment & Plan: Full-thickness diabetic wound that probes close to bone on the plantar aspect but does not appear to probe all the way. There is not a lot of soft tissue protecting that fifth metatarsal tuberosity so there is a full-thickness description of this wound. Plan: I would suggest to admission the patient for IV antibiotics particularly vancomycin since he had a MRSA infection in this area in the past that likely involved osteomyelitis. Also suggest a MRI tomorrow to evaluate that region see if there is likely osteo-also help in planning for long-term  antibiotics. Wound culture aerobic and anaerobic was taken in the room and I did an excisional debridement of the damaged skin from the ulceration. This was skin andsubcutaneous tissue that I removed from the area. As noted this was an excisional debridement with a #15 scalpel blade. Necrotic and devitalized tissue was removed there was irrigated and cleaned and dressed with sterile 4 x 4's Betadine and gauze wrap.we may be able to treat this with IV antibiotics for a day or 2 and then possibly send him home on oral antibiotics if the infection does not look too bad.  Family Communication: Plan discussed with patient    Perry Mount M.D on 03/05/2017 at 5:19 PM  Thank you for the consult, we will follow the patient with you in the Hospital.

## 2017-03-05 NOTE — ED Notes (Signed)
Pt has left foot ulcer.  Pt sent to er for eval by foot doctor.  Pt also fel in the tub today and has abrasion to left lower leg.  Pt has drainage from foot ulcer.  No fever.  Pt alert.  Speech clear.

## 2017-03-05 NOTE — ED Notes (Signed)
Report called to [facility].

## 2017-03-05 NOTE — H&P (Signed)
[name]    MR#:  [id number]  DATE OF BIRTH:  1960/07/14  DATE OF ADMISSION:  03/05/2017  PRIMARY CARE PHYSICIAN: Gavin Pound, MD   REQUESTING/REFERRING PHYSICIAN: Dr. Karma Greaser  CHIEF COMPLAINT:   Chief Complaint  Patient presents with  . Foot Pain    HISTORY OF PRESENT ILLNESS:  [name]  is a 56 y.o. male with a known history of Diabetes, hypertension, chronic left foot ulcer, diabetic neuropathy presents to the emergency room sent in by his podiatrist due to worsening left foot ulcer. Patient had left fifth foot area of ulceration with osteomyelitis and debridement in 2016. had a wound VAC placed and the wound improved well. But recently this has started worsening with purulent discharge. Here in the emergency room podiatry has been called in and bedside debridement is done. Dressing placed. Patient will be admitted for IV antibiotics and MRI to rule out osteomyelitis.  PAST MEDICAL HISTORY:   Past Medical History:  Diagnosis Date  . Arthritis   . BPH (benign prostatic hypertrophy)   . Diabetes mellitus   . Diabetic neuropathy ([place])   . Hypertension   . Metatarsal bone fracture    left 5th toe  . Osteomyelitis of toe of right foot ([place])   . Post-operative infection    and diabetic ulcer left foot  . Sleep apnea     does not wear CPAP  . Wears glasses     PAST SURGICAL HISTORY:   Past Surgical History:  Procedure Laterality Date  . COLONOSCOPY W/ BIOPSIES AND POLYPECTOMY    . I&D EXTREMITY Left 01/11/2016   Procedure: LEFT FOOT IRRIGATION AND DEBRIDEMENT WOUND VAC AND REMOVAL OF HARDWARE;  Surgeon: Wylene Simmer, MD;  Location: [facility];  Service: Orthopedics;  Laterality: Left;  . ORIF TOE FRACTURE Left 06/08/2015   Procedure: OPEN REDUCTION INTERNAL FIXATION (ORIF) LEFT FIFTH METATARSAL BASE FRACTURE NONUNION; CALCANEAL AUTOGRAFT ;  Surgeon: Wylene Simmer, MD;  Location: [facility];   Service: Orthopedics;  Laterality: Left;  . PATELLA RECONSTRUCTION Left 2005  . PROSTATE ABLATION  2014  . TOE AMPUTATION     partial amputation right great toe    SOCIAL HISTORY:   Social History  Substance Use Topics  . Smoking status: Never Smoker  . Smokeless tobacco: Never Used  . Alcohol use Yes     Comment: occasional     FAMILY HISTORY:   Family History  Problem Relation Age of Onset  . Diabetes Mother   . Diabetes Other     DRUG ALLERGIES:   Allergies  Allergen Reactions  . No Known Allergies     REVIEW OF SYSTEMS:   Review of Systems  Constitutional: Positive for malaise/fatigue. Negative for chills and fever.  HENT: Negative for sore throat.   Eyes: Negative for blurred vision, double vision and pain.  Respiratory: Negative for cough, hemoptysis, shortness of breath and wheezing.   Cardiovascular: Negative for chest pain, palpitations, orthopnea and leg swelling.  Gastrointestinal: Negative for abdominal pain, constipation, diarrhea, heartburn, nausea and vomiting.  Genitourinary: Negative for dysuria and hematuria.  Musculoskeletal: Positive for joint pain. Negative for back pain.  Skin: Negative for rash.  Neurological: Positive for sensory change (diabetic neuropathy) and weakness. Negative for speech change, focal weakness and headaches.  Endo/Heme/Allergies: Does not bruise/bleed easily.  Psychiatric/Behavioral: Negative for depression. The patient is not nervous/anxious.     MEDICATIONS AT HOME:   Prior  to Admission medications   Medication Sig Start Date End Date Taking? Authorizing Provider  aspirin EC 81 MG tablet Take 81 mg by mouth daily.   Yes [provider]  FARXIGA 10 MG TABS tablet Take 1 tablet by mouth daily. 03/03/17  Yes [provider]  gabapentin (NEURONTIN) 400 MG capsule Take 400 mg by mouth 2 (two) times daily.   Yes [provider]  Insulin Glargine (BASAGLAR KWIKPEN) 100 UNIT/ML SOPN Inject 0.35  mLs (35 Units total) into the skin at bedtime. 04/30/16  Yes Renato Shin, MD  losartan-hydrochlorothiazide (HYZAAR) 100-25 MG tablet Take 1 tablet by mouth daily. 12/24/15  Yes [provider]  metFORMIN (GLUCOPHAGE) 1000 MG tablet Take 1,000 mg by mouth 2 (two) times daily. 10/21/15  Yes [provider]  multivitamin-iron-minerals-folic acid (CENTRUM) chewable tablet Chew 1 tablet by mouth daily.   Yes [provider]  simvastatin (ZOCOR) 20 MG tablet TAKE 1 TABLET BY MOUTH IN THE EVENING 05/02/16  Yes Renato Shin, MD  TRADJENTA 5 MG TABS tablet Take 5 mg by mouth daily. 03/01/17  Yes [provider]  docusate sodium (COLACE) 100 MG capsule Take 1 capsule (100 mg total) by mouth 2 (two) times daily. While taking narcotic pain medicine. 01/12/16   Corky Sing, PA-C  insulin aspart (NOVOLOG) 100 UNIT/ML injection Inject 12-20 Units into the skin 3 (three) times daily before meals.    [provider]  insulin lispro (HUMALOG KWIKPEN) 100 UNIT/ML KiwkPen Inject 0.15 mLs (15 Units total) into the skin 3 (three) times daily with meals. And pen needles 4/day Patient not taking: Reported on 03/05/2017 04/30/16   Renato Shin, MD  oxyCODONE (ROXICODONE) 5 MG immediate release tablet Take 1-2 tablets (5-10 mg total) by mouth every 4 (four) hours as needed for moderate pain or severe pain. Patient not taking: Reported on 03/05/2017 01/12/16   Corky Sing, PA-C  senna (SENOKOT) 8.6 MG TABS tablet Take 2 tablets (17.2 mg total) by mouth 2 (two) times daily. 01/12/16   Corky Sing, PA-C     VITAL SIGNS:  Blood pressure (!) 153/65, pulse 94, temperature 98.1 F (36.7 C), temperature source Oral, resp. rate 18, height 5\' 10"  (1.778 m), weight 122.5 kg (270 lb), SpO2 93 %.  PHYSICAL EXAMINATION:  Physical Exam  GENERAL:  56 y.o.-year-old patient lying in the bed with no acute distress.  EYES: Pupils equal, round, reactive to light and accommodation.  No scleral icterus. Extraocular muscles intact.  HEENT: Head atraumatic, normocephalic. Oropharynx and nasopharynx clear. No oropharyngeal erythema, moist oral mucosa  NECK:  Supple, no jugular venous distention. No thyroid enlargement, no tenderness.  LUNGS: Normal breath sounds bilaterally, no wheezing, rales, rhonchi. No use of accessory muscles of respiration.  CARDIOVASCULAR: S1, S2 normal. No murmurs, rubs, or gallops.  ABDOMEN: Soft, nontender, nondistended. Bowel sounds present. No organomegaly or mass.  EXTREMITIES: Left foot under dressing. Left leg shin area with small skin tear NEUROLOGIC: Cranial nerves II through XII are intact. No focal Motor or sensory deficits appreciated b/l PSYCHIATRIC: The patient is alert and oriented x 3. Good affect.  SKIN: No obvious rash, lesion, or ulcer.   LABORATORY PANEL:   CBC  Recent Labs Lab 03/05/17 1521  WBC 11.4*  HGB 14.2  HCT 41.3  PLT 214   ------------------------------------------------------------------------------------------------------------------  Chemistries   Recent Labs Lab 03/05/17 1521  NA 138  K 4.0  CL 105  CO2 26  GLUCOSE 173*  BUN 15  CREATININE 1.02  CALCIUM 8.7*  AST 37  ALT 66*  ALKPHOS 107  BILITOT 0.4   ------------------------------------------------------------------------------------------------------------------  Cardiac Enzymes No results for input(s): TROPONINI in the last 168 hours. ------------------------------------------------------------------------------------------------------------------  RADIOLOGY:  Dg Foot Complete Left  Result Date: 03/05/2017 CLINICAL DATA:  Left fifth metatarsal infection. EXAM: LEFT FOOT - COMPLETE 3+ VIEW COMPARISON:  Radiographs of June 19, 2016. FINDINGS: No fracture or dislocation is noted. Mild spurring of posterior calcaneus is noted. No lytic destruction is seen. Soft tissue thickening is seen over the proximal portion of the fifth metatarsal  with ulceration. IMPRESSION: Probable ulceration seen in soft tissues overlying proximal portion of fifth metatarsal laterally. No lytic destruction is seen to suggest osteomyelitis. Electronically Signed   By: Marijo Conception, M.D.   On: 03/05/2017 16:11     IMPRESSION AND PLAN:   * Left foot cellulitis/ulcer with concern for osteomyelitis X-ray does not show osteomyelitis. MRI ordered. Appreciate podiatry input. Bedside debridement and cultures done. Start IV ciprofloxacin and vancomycin. Admit patient to medical floor. History of MRSA. MRSA screen  * Insulin-dependent diabetes mellitus. Will reduce home dose of Lantus to 30 units. Sliding scale insulin. Diabetic diet.  * Hypertension. Continue home medications.  * DVT prophylaxis with Lovenox  All the records are reviewed and case discussed with ED provider. Management plans discussed with the patient, family and they are in agreement.  CODE STATUS: FULL CODE  TOTAL TIME TAKING CARE OF THIS PATIENT: 40 minutes.   Hillary Bow R M.D on 03/05/2017 at 6:32 PM  Between 7am to 6pm - Pager - [phone]  After 6pm go to [email or web address] - password [email or web address] [facility]  Office  [phone]  CC: Primary care physician; Gavin Pound, MD  Note: This dictation was prepared with Dragon dictation along with smaller phrase technology. Any transcriptional errors that result from this process are unintentional.

## 2017-03-05 NOTE — Progress Notes (Signed)
Pharmacy Antibiotic Note  [name] is a 56 y.o. male admitted on 03/05/2017 with cellulitis.  Pharmacy has been consulted for vancomycin dosing.  Plan: Vancomycin 1500mg   IV every 12 hours.  Goal trough 10-15 mcg/mL. Vancomycin trough prior to 4th dose  Height: 5\' 10"  (177.8 cm) Weight: 270 lb (122.5 kg) IBW/kg (Calculated) : 73  Temp (24hrs), Avg:98.1 F (36.7 C), Min:98.1 F (36.7 C), Max:98.1 F (36.7 C)   Recent Labs Lab 03/05/17 1521  WBC 11.4*  CREATININE 1.02    Estimated Creatinine Clearance: 107.4 mL/min (by C-G formula based on SCr of 1.02 mg/dL).    Allergies  Allergen Reactions  . No Known Allergies     Antimicrobials this admission: 9/5 vancomycin >>  9/5 ciprofloxacin >>    Microbiology results: 9/5 surgical deep wound abscess from left foot: Pending  9/5 MRSA PCR: pending   Thank you for allowing pharmacy to be a part of this patient's care.  Donna Christen Maryna Yeagle 03/05/2017 6:39 PM

## 2017-03-05 NOTE — ED Triage Notes (Signed)
States he was sent by podiatrist for ulcer/infection of left 5th metarsal, states hx of osteomyelitis, ortho shoe on

## 2017-03-05 NOTE — ED Notes (Signed)
Dr troxler in with pt.  

## 2017-03-05 NOTE — ED Notes (Signed)
Pt waiting on admission bed.  Pt alert  Watching tv.  Family with pt.

## 2017-03-05 NOTE — ED Provider Notes (Signed)
[facility] Emergency Department Provider Note  ____________________________________________   First MD Initiated Contact with Patient 03/05/17 1615     (approximate)  I have reviewed the triage vital signs and the nursing notes.   HISTORY  Chief Complaint Foot Pain    HPI [name] is a 56 y.o. male with insulin-dependent diabetes and past history of osteomyelitis and prior foot wound to left foot who presents and the recommendation of his podiatrist.  He had a prior wound to his left foot at the same general area in the past that did have osteomyelitis which reportedly did test positive for MRSA.  That healed up, but over the last month it is started developing a gradually worsening wound again.  He went to his outpatient podiatrist in [place] when she looked at it today he states it drained a fair amount of pus.  Given that it is worsening and quite large and she sent him to the emergency department for probable admission, IV antibiotics, and possible surgery.  He denies fever/chills, chest pain, shortness of breath, nausea, vomiting, abdominal pain.  He states that his diabetes is fairly well under control now.  The pain in his foot has worsened significantly recently and is severe and much worse with weightbearing.  Nothing makes it better.  He has not been on antibiotics recently.  By coincidence, he slipped in the bathtub today and has a skin tear on his left shin, but he is not particularly concerned about that.   Past Medical History:  Diagnosis Date  . Arthritis   . BPH (benign prostatic hypertrophy)   . Diabetes mellitus   . Diabetic neuropathy ([place])   . Hypertension   . Metatarsal bone fracture    left 5th toe  . Osteomyelitis of toe of right foot ([place])   . Post-operative infection    and diabetic ulcer left foot  . Sleep apnea     does not wear CPAP  . Wears glasses     Patient Active Problem List   Diagnosis Date Noted  .  Surgery, elective   . Hardware complicating wound infection ([place])   . Diabetes mellitus due to underlying condition, uncontrolled, with diabetic neuropathy ([place])   . Metatarsal stress fracture of left foot 01/12/2016  . Diabetes mellitus due to underlying condition with diabetic polyneuropathy ([place]) 01/12/2016  . Diabetic infection of left foot ([place]) 01/12/2016  . Diabetic ulcer of foot associated with type 2 diabetes mellitus, with necrosis of muscle ([place]) 01/11/2016    Past Surgical History:  Procedure Laterality Date  . COLONOSCOPY W/ BIOPSIES AND POLYPECTOMY    . I&D EXTREMITY Left 01/11/2016   Procedure: LEFT FOOT IRRIGATION AND DEBRIDEMENT WOUND VAC AND REMOVAL OF HARDWARE;  Surgeon: Wylene Simmer, MD;  Location: [facility];  Service: Orthopedics;  Laterality: Left;  . ORIF TOE FRACTURE Left 06/08/2015   Procedure: OPEN REDUCTION INTERNAL FIXATION (ORIF) LEFT FIFTH METATARSAL BASE FRACTURE NONUNION; CALCANEAL AUTOGRAFT ;  Surgeon: Wylene Simmer, MD;  Location: [facility];  Service: Orthopedics;  Laterality: Left;  . PATELLA RECONSTRUCTION Left 2005  . PROSTATE ABLATION  2014  . TOE AMPUTATION     partial amputation right great toe    Prior to Admission medications   Medication Sig Start Date End Date Taking? Authorizing Provider  aspirin EC 81 MG tablet Take 81 mg by mouth daily.   Yes [provider]  FARXIGA 10 MG TABS tablet Take 1 tablet by mouth daily.  03/03/17  Yes [provider]  gabapentin (NEURONTIN) 400 MG capsule Take 400 mg by mouth 2 (two) times daily.   Yes [provider]  Insulin Glargine (BASAGLAR KWIKPEN) 100 UNIT/ML SOPN Inject 0.35 mLs (35 Units total) into the skin at bedtime. 04/30/16  Yes Renato Shin, MD  losartan-hydrochlorothiazide (HYZAAR) 100-25 MG tablet Take 1 tablet by mouth daily. 12/24/15  Yes [provider]  metFORMIN (GLUCOPHAGE) 1000 MG tablet Take 1,000 mg by mouth 2 (two) times daily. 10/21/15  Yes  [provider]  multivitamin-iron-minerals-folic acid (CENTRUM) chewable tablet Chew 1 tablet by mouth daily.   Yes [provider]  simvastatin (ZOCOR) 20 MG tablet TAKE 1 TABLET BY MOUTH IN THE EVENING 05/02/16  Yes Renato Shin, MD  TRADJENTA 5 MG TABS tablet Take 5 mg by mouth daily. 03/01/17  Yes [provider]  docusate sodium (COLACE) 100 MG capsule Take 1 capsule (100 mg total) by mouth 2 (two) times daily. While taking narcotic pain medicine. 01/12/16   Corky Sing, PA-C  insulin aspart (NOVOLOG) 100 UNIT/ML injection Inject 12-20 Units into the skin 3 (three) times daily before meals.    [provider]  insulin lispro (HUMALOG KWIKPEN) 100 UNIT/ML KiwkPen Inject 0.15 mLs (15 Units total) into the skin 3 (three) times daily with meals. And pen needles 4/day Patient not taking: Reported on 03/05/2017 04/30/16   Renato Shin, MD  oxyCODONE (ROXICODONE) 5 MG immediate release tablet Take 1-2 tablets (5-10 mg total) by mouth every 4 (four) hours as needed for moderate pain or severe pain. Patient not taking: Reported on 03/05/2017 01/12/16   Corky Sing, PA-C  senna (SENOKOT) 8.6 MG TABS tablet Take 2 tablets (17.2 mg total) by mouth 2 (two) times daily. 01/12/16   Corky Sing, PA-C    Allergies No known allergies  Family History  Problem Relation Age of Onset  . Diabetes Mother   . Diabetes Other     Social History Social History  Substance Use Topics  . Smoking status: Never Smoker  . Smokeless tobacco: Never Used  . Alcohol use Yes     Comment: occasional     Review of Systems Constitutional: No fever/chills Eyes: No visual changes. ENT: No sore throat. Cardiovascular: Denies chest pain. Respiratory: Denies shortness of breath. Gastrointestinal: No abdominal pain.  No nausea, no vomiting.  No diarrhea.  No constipation. Genitourinary: Negative for dysuria. Musculoskeletal: Pain and wound/abscess in left lateral foot .  Negative for neck pain.  Negative for back pain. Integumentary: Worsening wound on left foot, now draining purulent material Neurological: Negative for headaches, focal weakness or numbness.   ____________________________________________   PHYSICAL EXAM:  VITAL SIGNS: ED Triage Vitals  Enc Vitals Group     BP 03/05/17 1519 (!) 149/65     Pulse Rate 03/05/17 1519 99     Resp 03/05/17 1519 18     Temp 03/05/17 1519 98.1 F (36.7 C)     Temp Source 03/05/17 1519 Oral     SpO2 03/05/17 1519 100 %     Weight 03/05/17 1517 122.5 kg (270 lb)     Height 03/05/17 1517 1.778 m (5\' 10" )     Head Circumference --      Peak Flow --      Pain Score 03/05/17 1517 10     Pain Loc --      Pain Edu? --      Excl. in [place]? --  Constitutional: Alert and oriented. Well appearing and in no acute distress. Eyes: Conjunctivae are normal.  Head: Atraumatic. Cardiovascular: Normal rate, regular rhythm. Good peripheral circulation. Grossly normal heart sounds. Respiratory: Normal respiratory effort.  No retractions.  Gastrointestinal: Soft and nontender.  Musculoskeletal: See skin exam below for details about left foot and leg.  Otherwise there are no gross abnormalities of his musculoskeletal system. Neurologic:  Normal speech and language. No gross focal neurologic deficits are appreciated.  Skin:  The patient has a 7 cm long superficial skin tear to his left anterior shin that is about a centimeter wide.  On his left foot, he has an area of fluctuance with what appears to be purulent material underneath it that is approximately 5 cm in diameter to the lateral part of the middle of his left foot.  There is an open area in the middle that is draining some blood now that he states drained purulent material earlier today.  The area is extremely tender to palpation.  There is no surrounding cellulitis. Psychiatric: Mood and affect are normal. Speech and behavior are  normal.  ____________________________________________   LABS (all labs ordered are listed, but only abnormal results are displayed)  Labs Reviewed  COMPREHENSIVE METABOLIC PANEL - Abnormal; Notable for the following:       Result Value   Glucose, Bld 173 (*)    Calcium 8.7 (*)    Albumin 3.1 (*)    ALT 66 (*)    All other components within normal limits  CBC WITH DIFFERENTIAL/PLATELET - Abnormal; Notable for the following:    WBC 11.4 (*)    Neutro Abs 7.6 (*)    Monocytes Absolute 1.1 (*)    All other components within normal limits  AEROBIC/ANAEROBIC CULTURE (SURGICAL/DEEP WOUND)   ____________________________________________  EKG  None - EKG not ordered by ED physician ____________________________________________  RADIOLOGY   Dg Foot Complete Left  Result Date: 03/05/2017 CLINICAL DATA:  Left fifth metatarsal infection. EXAM: LEFT FOOT - COMPLETE 3+ VIEW COMPARISON:  Radiographs of June 19, 2016. FINDINGS: No fracture or dislocation is noted. Mild spurring of posterior calcaneus is noted. No lytic destruction is seen. Soft tissue thickening is seen over the proximal portion of the fifth metatarsal with ulceration. IMPRESSION: Probable ulceration seen in soft tissues overlying proximal portion of fifth metatarsal laterally. No lytic destruction is seen to suggest osteomyelitis. Electronically Signed   By: Marijo Conception, M.D.   On: 03/05/2017 16:11    ____________________________________________   PROCEDURES  Critical Care performed: No   Procedure(s) performed:   Procedures   ____________________________________________   INITIAL IMPRESSION / ASSESSMENT AND PLAN / ED COURSE  Pertinent labs & imaging results that were available during my care of the patient were reviewed by me and considered in my medical decision making (see chart for details).  Given that the patient was sent from a podiatry clinic, I called our podiatrist on-call, Dr. Elvina Mattes, who  came to the emergency department and evaluated the patient in person.  We discussed the case in person as well.  He performed a bedside debridement and was able to express purulent material which she collected for a wound culture.  He recommended, given the patient's prior osteomyelitis in the same area, that we admit to the hospitalist service.  He specifically recommended Cipro IV and vancomycin IV which I have ordered.  He placed an order for the wound culture and an MRI of the foot to make sure there is no signs  of early osteomyelitis.  I updated the hospitalist, Dr. Darvin Neighbours, who will admit.  The patient agrees with the plan.      ____________________________________________  FINAL CLINICAL IMPRESSION(S) / ED DIAGNOSES  Final diagnoses:  Ulcer of left foot, unspecified ulcer stage (HCC)     MEDICATIONS GIVEN DURING THIS VISIT:  Medications  vancomycin (VANCOCIN) IVPB 1000 mg/200 mL premix (not administered)  ciprofloxacin (CIPRO) IVPB 400 mg (400 mg Intravenous New Bag/Given 03/05/17 1813)     NEW OUTPATIENT MEDICATIONS STARTED DURING THIS VISIT:  New Prescriptions   No medications on file    Modified Medications   No medications on file    Discontinued Medications   TAMSULOSIN (FLOMAX) 0.4 MG CAPS CAPSULE    Take 0.4 mg by mouth daily.      Note:  This document was prepared using Dragon voice recognition software and may include unintentional dictation errors.    Hinda Kehr, MD 03/05/17 [facility]

## 2017-03-06 LAB — BASIC METABOLIC PANEL
Anion gap: 7 (ref 5–15)
BUN: 14 mg/dL (ref 6–20)
CALCIUM: 8.4 mg/dL — AB (ref 8.9–10.3)
CO2: 28 mmol/L (ref 22–32)
Chloride: 103 mmol/L (ref 101–111)
Creatinine, Ser: 0.92 mg/dL (ref 0.61–1.24)
GFR calc Af Amer: >60 mL/min (ref >60)
GLUCOSE: 99 mg/dL (ref 65–99)
Potassium: 3.7 mmol/L (ref 3.5–5.1)
Sodium: 138 mmol/L (ref 135–145)

## 2017-03-06 LAB — GLUCOSE, CAPILLARY
GLUCOSE-CAPILLARY: 94 mg/dL (ref 65–99)
Glucose-Capillary: 130 mg/dL — ABNORMAL HIGH (ref 65–99)
Glucose-Capillary: 135 mg/dL — ABNORMAL HIGH (ref 65–99)
Glucose-Capillary: 140 mg/dL — ABNORMAL HIGH (ref 65–99)

## 2017-03-06 LAB — CBC
HEMATOCRIT: 38.9 % — AB (ref 40.0–52.0)
HEMOGLOBIN: 13.5 g/dL (ref 13.0–18.0)
MCH: 31.6 pg (ref 26.0–34.0)
MCHC: 34.8 g/dL (ref 32.0–36.0)
MCV: 90.7 fL (ref 80.0–100.0)
Platelets: 205 10*3/uL (ref 150–440)
RBC: 4.28 MIL/uL — ABNORMAL LOW (ref 4.40–5.90)
RDW: 14.1 % (ref 11.5–14.5)
WBC: 9 10*3/uL (ref 3.8–10.6)

## 2017-03-06 LAB — MRSA PCR SCREENING: MRSA BY PCR: NEGATIVE

## 2017-03-06 NOTE — Progress Notes (Signed)
[facility] [place] NAME: [name]    MR#:  [id number]  DATE OF BIRTH:  1961-05-30  SUBJECTIVE:  CHIEF COMPLAINT:   Chief Complaint  Patient presents with  . Foot Pain    Have recurrent ulcere on left foot, sent from podiatry clinic. I & D done.   Xray and mRI on foot- does not show osteomyelitis, on Abx, feels better today.  REVIEW OF SYSTEMS:  CONSTITUTIONAL: No fever, fatigue or weakness.  EYES: No blurred or double vision.  EARS, NOSE, AND THROAT: No tinnitus or ear pain.  RESPIRATORY: No cough, shortness of breath, wheezing or hemoptysis.  CARDIOVASCULAR: No chest pain, orthopnea, edema.  GASTROINTESTINAL: No nausea, vomiting, diarrhea or abdominal pain.  GENITOURINARY: No dysuria, hematuria.  ENDOCRINE: No polyuria, nocturia,  HEMATOLOGY: No anemia, easy bruising or bleeding SKIN: No rash or lesion. MUSCULOSKELETAL: No joint pain or arthritis.left foot pain.   NEUROLOGIC: No tingling, numbness, weakness.  PSYCHIATRY: No anxiety or depression.   ROS  DRUG ALLERGIES:   Allergies  Allergen Reactions  . No Known Allergies     VITALS:  Blood pressure (!) 151/77, pulse 80, temperature 97.8 F (36.6 C), temperature source Oral, resp. rate 17, height 5\' 10"  (1.778 m), weight 123.3 kg (271 lb 12.8 oz), SpO2 98 %.  PHYSICAL EXAMINATION:  GENERAL:  56 y.o.-year-old patient lying in the bed with no acute distress.  EYES: Pupils equal, round, reactive to light and accommodation. No scleral icterus. Extraocular muscles intact.  HEENT: Head atraumatic, normocephalic. Oropharynx and nasopharynx clear.  NECK:  Supple, no jugular venous distention. No thyroid enlargement, no tenderness.  LUNGS: Normal breath sounds bilaterally, no wheezing, rales,rhonchi or crepitation. No use of accessory muscles of respiration.  CARDIOVASCULAR: S1, S2 normal. No murmurs, rubs, or gallops.  ABDOMEN: Soft, nontender, nondistended. Bowel sounds  present. No organomegaly or mass.  EXTREMITIES: No pedal edema, cyanosis, or clubbing. Dressing on left foot and leg NEUROLOGIC: Cranial nerves II through XII are intact. Muscle strength 5/5 in all extremities. Sensation intact. Gait not checked.  PSYCHIATRIC: The patient is alert and oriented x 3.  SKIN: No obvious rash, lesion, or ulcer.   Physical Exam LABORATORY PANEL:   CBC  Recent Labs Lab 03/06/17 0414  WBC 9.0  HGB 13.5  HCT 38.9*  PLT 205   ------------------------------------------------------------------------------------------------------------------  Chemistries   Recent Labs Lab 03/05/17 1521 03/06/17 0414  NA 138 138  K 4.0 3.7  CL 105 103  CO2 26 28  GLUCOSE 173* 99  BUN 15 14  CREATININE 1.02 0.92  CALCIUM 8.7* 8.4*  AST 37  --   ALT 66*  --   ALKPHOS 107  --   BILITOT 0.4  --    ------------------------------------------------------------------------------------------------------------------  Cardiac Enzymes No results for input(s): TROPONINI in the last 168 hours. ------------------------------------------------------------------------------------------------------------------  RADIOLOGY:  Mr Foot Left Wo Contrast  Result Date: 03/05/2017 CLINICAL DATA:  Diabetic patient with infected wound to the left foot. History of previous infection in the same area last year with MR SA and osteomyelitis. Treated with surgical debridement. EXAM: MRI OF THE LEFT FOOT WITHOUT CONTRAST TECHNIQUE: Multiplanar, multisequence MR imaging of the left foot was performed. No intravenous contrast was administered. COMPARISON:  05/05/2016 MRI. CT left foot 01/08/2016. Left foot radiographs 03/05/2017. FINDINGS: Bones/Joint/Cartilage There is deformity and sclerosis demonstrated in the proximal and midportion of the left fifth metatarsal bone. Adjacent susceptibility artifact consistent with postoperative change. No evidence of any bone  marrow edema in this area. Changes are  likely to represent postoperative and chronic deformity. No definite evidence of acute osteomyelitis. Degenerative changes in the intertarsal joints. Ligaments Limited visualization. No discrete ligamentous abnormalities identified. Muscles and Tendons Visualized extensor and flexor tendons and peroneal tendons appear intact. Soft tissues There is diffuse edema throughout the subcutaneous fat and soft tissues over the dorsum of the of the foot extending from the proximal metatarsal region through the metatarsal phalangeal region. Skin thickening, ulceration, and edema along the lateral aspect of the foot at the level of the proximal to mid fifth metatarsal bone. Changes are consistent with ulceration, edema, and cellulitis. No loculated fluid collections to suggest abscess. Note that evaluation for abscess or osteomyelitis is less sensitive without contrast material. IMPRESSION: Chronic sclerosis demonstrated in the proximal and mid left fifth metatarsal bone. No evidence of acute osteomyelitis. Soft tissue edema over the dorsum of the foot with skin thickening and ulceration laterally. Changes likely representing cellulitis. No discrete abscess. Electronically Signed   By: Lucienne Capers M.D.   On: 03/05/2017 22:41   Dg Foot Complete Left  Result Date: 03/05/2017 CLINICAL DATA:  Left fifth metatarsal infection. EXAM: LEFT FOOT - COMPLETE 3+ VIEW COMPARISON:  Radiographs of June 19, 2016. FINDINGS: No fracture or dislocation is noted. Mild spurring of posterior calcaneus is noted. No lytic destruction is seen. Soft tissue thickening is seen over the proximal portion of the fifth metatarsal with ulceration. IMPRESSION: Probable ulceration seen in soft tissues overlying proximal portion of fifth metatarsal laterally. No lytic destruction is seen to suggest osteomyelitis. Electronically Signed   By: Marijo Conception, M.D.   On: 03/05/2017 16:11    ASSESSMENT AND PLAN:   Active Problems:   Cellulitis of  left foot   * Left foot cellulitis/ulcer with abscess X-ray and MRI does not show osteomyelitis.   Appreciate podiatry input. Bedside debridement and cultures done.   On IV ciprofloxacin and vancomycin. History of MRSA. MRSA screen negative. As per podiatry, cont IV today and likely switch to oral and d/c.  * Insulin-dependent diabetes mellitus. Will reduce home dose of Lantus to 30 units. Sliding scale insulin. Diabetic diet.  * Hypertension. Continue home medications.  * DVT prophylaxis with Lovenox     All the records are reviewed and case discussed with Care Management/Social Workerr. Management plans discussed with the patient, family and they are in agreement.  CODE STATUS: FUll  TOTAL TIME TAKING CARE OF THIS PATIENT: 35 minutes.     POSSIBLE D/C IN 1-2 DAYS, DEPENDING ON CLINICAL CONDITION.   Vaughan Basta M.D on 03/06/2017   Between 7am to 6pm - Pager - [phone]  After 6pm go to [email or web address] - password [email or web address] [facility]  Office  [phone]  CC: Primary care physician; Gavin Pound, MD  Note: This dictation was prepared with Dragon dictation along with smaller phrase technology. Any transcriptional errors that result from this process are unintentional.

## 2017-03-06 NOTE — Progress Notes (Signed)
Patient Demographics  [name], is a 56 y.o. male   MRN: [id number]   DOB - Nov 20, 1960  Admit Date - 03/05/2017    Outpatient Primary MD for the patient is Gavin Pound, MD  Consult requested in the Hospital by Vaughan Basta, *, On 03/06/2017    With History of -  Past Medical History:  Diagnosis Date  . Arthritis   . BPH (benign prostatic hypertrophy)   . Diabetes mellitus   . Diabetic neuropathy ([place])   . Hypertension   . Metatarsal bone fracture    left 5th toe  . Osteomyelitis of toe of right foot ([place])   . Post-operative infection    and diabetic ulcer left foot  . Sleep apnea     does not wear CPAP  . Wears glasses       Past Surgical History:  Procedure Laterality Date  . COLONOSCOPY W/ BIOPSIES AND POLYPECTOMY    . I&D EXTREMITY Left 01/11/2016   Procedure: LEFT FOOT IRRIGATION AND DEBRIDEMENT WOUND VAC AND REMOVAL OF HARDWARE;  Surgeon: Wylene Simmer, MD;  Location: [facility];  Service: Orthopedics;  Laterality: Left;  . ORIF TOE FRACTURE Left 06/08/2015   Procedure: OPEN REDUCTION INTERNAL FIXATION (ORIF) LEFT FIFTH METATARSAL BASE FRACTURE NONUNION; CALCANEAL AUTOGRAFT ;  Surgeon: Wylene Simmer, MD;  Location: [facility];  Service: Orthopedics;  Laterality: Left;  . PATELLA RECONSTRUCTION Left 2005  . PROSTATE ABLATION  2014  . TOE AMPUTATION     partial amputation right great toe    in for   Chief Complaint  Patient presents with  . Foot Pain     HPI  [name]  is a 56 y.o. male, Admitted through the emergency room yesterday. A year ago had a history of osteomyelitis and ulceration secondary to diabetes on that left foot same area. He came in to the ER within open draining wound and infection to the region yesterday. I saw him in the ER and did an excisional  debridement of some damaged tissue and allow the area to drain he's had a dressing on it since. He is in hospital currently and receiving intravenous antibiotics. Right has getting IV vancomycin and IV Cipro.    Review of Systems    In addition to the HPI above,  No Fever-chills, No Headache, No changes with Vision or hearing, No problems swallowing food or Liquids, No Chest pain, Cough or Shortness of Breath, No Abdominal pain, No Nausea or Vommitting, Bowel movements are regular, No Blood in stool or Urine, No dysuria, No new skin rashes or bruises, No new joints pains-aches,  No new weakness, tingling, numbness in any extremity, No recent weight gain or loss, No polyuria, polydypsia or polyphagia, No significant Mental Stressors.  A full 10 point Review of Systems was done, except as stated above, all other Review of Systems were negative.   Social History Social History  Substance Use Topics  . Smoking status: Never Smoker  . Smokeless tobacco: Never Used  . Alcohol use Yes     Comment: occasional      Family History Family History  Problem Relation Age of Onset  . Diabetes Mother   .  Diabetes Other      Prior to Admission medications   Medication Sig Start Date End Date Taking? Authorizing Provider  aspirin EC 81 MG tablet Take 81 mg by mouth daily.   Yes [provider]  FARXIGA 10 MG TABS tablet Take 1 tablet by mouth daily. 03/03/17  Yes [provider]  gabapentin (NEURONTIN) 400 MG capsule Take 400 mg by mouth 2 (two) times daily.   Yes [provider]  Insulin Glargine (BASAGLAR KWIKPEN) 100 UNIT/ML SOPN Inject 0.35 mLs (35 Units total) into the skin at bedtime. 04/30/16  Yes Renato Shin, MD  losartan-hydrochlorothiazide (HYZAAR) 100-25 MG tablet Take 1 tablet by mouth daily. 12/24/15  Yes [provider]  metFORMIN (GLUCOPHAGE) 1000 MG tablet Take 1,000 mg by mouth 2 (two) times daily. 10/21/15  Yes [provider]  multivitamin-iron-minerals-folic acid (CENTRUM) chewable tablet Chew 1 tablet by mouth daily.   Yes [provider]  simvastatin (ZOCOR) 20 MG tablet TAKE 1 TABLET BY MOUTH IN THE EVENING 05/02/16  Yes Renato Shin, MD  TRADJENTA 5 MG TABS tablet Take 5 mg by mouth daily. 03/01/17  Yes [provider]  docusate sodium (COLACE) 100 MG capsule Take 1 capsule (100 mg total) by mouth 2 (two) times daily. While taking narcotic pain medicine. 01/12/16   Corky Sing, PA-C  insulin aspart (NOVOLOG) 100 UNIT/ML injection Inject 12-20 Units into the skin 3 (three) times daily before meals.    [provider]  insulin lispro (HUMALOG KWIKPEN) 100 UNIT/ML KiwkPen Inject 0.15 mLs (15 Units total) into the skin 3 (three) times daily with meals. And pen needles 4/day Patient not taking: Reported on 03/05/2017 04/30/16   Renato Shin, MD  oxyCODONE (ROXICODONE) 5 MG immediate release tablet Take 1-2 tablets (5-10 mg total) by mouth every 4 (four) hours as needed for moderate pain or severe pain. Patient not taking: Reported on 03/05/2017 01/12/16   Corky Sing, PA-C  senna (SENOKOT) 8.6 MG TABS tablet Take 2 tablets (17.2 mg total) by mouth 2 (two) times daily. 01/12/16   Corky Sing, PA-C    Anti-infectives    Start     Dose/Rate Route Frequency Ordered Stop   03/06/17 0300  vancomycin (VANCOCIN) 1,500 mg in sodium chloride 0.9 % 500 mL IVPB     1,500 mg 250 mL/hr over 120 Minutes Intravenous Every 12 hours 03/05/17 1944     03/06/17 0100  vancomycin (VANCOCIN) 1,500 mg in sodium chloride 0.9 % 500 mL IVPB  Status:  Discontinued     1,500 mg 250 mL/hr over 120 Minutes Intravenous Every 12 hours 03/05/17 1839 03/05/17 1944   03/05/17 1730  vancomycin (VANCOCIN) IVPB 1000 mg/200 mL premix     1,000 mg 200 mL/hr over 60 Minutes Intravenous  Once 03/05/17 1728     03/05/17 1730  ciprofloxacin (CIPRO) IVPB 400 mg     400 mg 200 mL/hr over 60 Minutes Intravenous  Once  03/05/17 1728 03/05/17 1913   03/05/17 0600  ciprofloxacin (CIPRO) IVPB 400 mg     400 mg 200 mL/hr over 60 Minutes Intravenous Every 12 hours 03/05/17 1826        Scheduled Meds: . aspirin EC  81 mg Oral Daily  . docusate sodium  100 mg Oral BID  . enoxaparin (LOVENOX) injection  40 mg Subcutaneous Q24H  . gabapentin  400 mg Oral BID  . losartan  100 mg Oral Daily   And  . hydrochlorothiazide  25 mg Oral Daily  . insulin aspart  0-15 Units Subcutaneous TID WC  . insulin aspart  0-5 Units Subcutaneous QHS  . insulin glargine  30 Units Subcutaneous QHS  . linagliptin  5 mg Oral Daily  . senna  2 tablet Oral BID  . simvastatin  20 mg Oral QPM   Continuous Infusions: . ciprofloxacin Stopped (03/06/17 0706)  . vancomycin 1,500 mg (03/06/17 0324)  . vancomycin     PRN Meds:.acetaminophen **OR** acetaminophen, albuterol, bisacodyl, ondansetron **OR** ondansetron (ZOFRAN) IV, oxyCODONE, polyethylene glycol  Allergies  Allergen Reactions  . No Known Allergies     Physical Exam  Vitals  Blood pressure 134/76, pulse 85, temperature 98.5 F (36.9 C), temperature source Oral, resp. rate 18, height 5\' 10"  (1.778 m), weight 123.3 kg (271 lb 12.8 oz), SpO2 97 %.  Lower Extremity exam:  Vascular:Palpable bilateral  Dermatological: Exam today shows there is an ulceration to the lateral left foot approximately 3.4 cm in length and 1.1 cm in width and is deepest point is proximally 5 mm at this point. Surrounding cellulitis is improved since yesterday with no extensive purulent drainage at all today either.  Neurological: Diabetic neuropathy bilateral and peripheral  Ortho: Based on x-rays and MRI patient does not appear to have any bone infection at this point. It appears that he had a likely fracture to his fifth metatarsal this is likely last year and precipitated the original ulcerative event. Also great situation where the fifth metatarsal proximal base and tuberosity are more  prominent with his gait and walking. His on the left foot. MRI and x-rays are negative for any type of bone edema at this point.  Data Review  CBC  Recent Labs Lab 03/05/17 1521 03/06/17 0414  WBC 11.4* 9.0  HGB 14.2 13.5  HCT 41.3 38.9*  PLT 214 205  MCV 90.1 90.7  MCH 30.9 31.6  MCHC 34.3 34.8  RDW 13.9 14.1  LYMPHSABS 2.4  --   MONOABS 1.1*  --   EOSABS 0.1  --   BASOSABS 0.1  --    ------------------------------------------------------------------------------------------------------------------  Chemistries   Recent Labs Lab 03/05/17 1521 03/06/17 0414  NA 138 138  K 4.0 3.7  CL 105 103  CO2 26 28  GLUCOSE 173* 99  BUN 15 14  CREATININE 1.02 0.92  CALCIUM 8.7* 8.4*  AST 37  --   ALT 66*  --   ALKPHOS 107  --   BILITOT 0.4  --    ------------------------------------------------------------------------------------------------------------------ estimated creatinine clearance is 119.5 mL/min (by C-G formula based on SCr of 0.92 mg/dL). ------------------------------------------------------------------------------------------------------------------ No results for input(s): TSH, T4TOTAL, T3FREE, THYROIDAB in the last 72 hours.  Invalid input(s): FREET3   Coagulation profile No results for input(s): INR, PROTIME in the last 168 hours. ------------------------------------------------------------------------------------Imaging results:   Mr Foot Left Wo Contrast  Result Date: 03/05/2017 CLINICAL DATA:  Diabetic patient with infected wound to the left foot. History of previous infection in the same area last year with MR SA and osteomyelitis. Treated with surgical debridement. EXAM: MRI OF THE LEFT FOOT WITHOUT CONTRAST TECHNIQUE: Multiplanar, multisequence MR imaging of the left foot was performed. No intravenous contrast was administered. COMPARISON:  05/05/2016 MRI. CT left foot 01/08/2016. Left foot radiographs 03/05/2017. FINDINGS: Bones/Joint/Cartilage There  is deformity and sclerosis demonstrated in the proximal and midportion of the left fifth metatarsal bone. Adjacent susceptibility artifact consistent with postoperative change. No evidence of any bone marrow edema in this area. Changes are likely to represent  postoperative and chronic deformity. No definite evidence of acute osteomyelitis. Degenerative changes in the intertarsal joints. Ligaments Limited visualization. No discrete ligamentous abnormalities identified. Muscles and Tendons Visualized extensor and flexor tendons and peroneal tendons appear intact. Soft tissues There is diffuse edema throughout the subcutaneous fat and soft tissues over the dorsum of the of the foot extending from the proximal metatarsal region through the metatarsal phalangeal region. Skin thickening, ulceration, and edema along the lateral aspect of the foot at the level of the proximal to mid fifth metatarsal bone. Changes are consistent with ulceration, edema, and cellulitis. No loculated fluid collections to suggest abscess. Note that evaluation for abscess or osteomyelitis is less sensitive without contrast material. IMPRESSION: Chronic sclerosis demonstrated in the proximal and mid left fifth metatarsal bone. No evidence of acute osteomyelitis. Soft tissue edema over the dorsum of the foot with skin thickening and ulceration laterally. Changes likely representing cellulitis. No discrete abscess. Electronically Signed   By: Lucienne Capers M.D.   On: 03/05/2017 22:41   Dg Foot Complete Left  Result Date: 03/05/2017 CLINICAL DATA:  Left fifth metatarsal infection. EXAM: LEFT FOOT - COMPLETE 3+ VIEW COMPARISON:  Radiographs of June 19, 2016. FINDINGS: No fracture or dislocation is noted. Mild spurring of posterior calcaneus is noted. No lytic destruction is seen. Soft tissue thickening is seen over the proximal portion of the fifth metatarsal with ulceration. IMPRESSION: Probable ulceration seen in soft tissues overlying  proximal portion of fifth metatarsal laterally. No lytic destruction is seen to suggest osteomyelitis. Electronically Signed   By: Marijo Conception, M.D.   On: 03/05/2017 16:11   Assessment & Plan: Overall the patient's much more stable day does not appear to have osteomyelitis and cellulitis is improved white count has returned back to normal. Plan: Continue IV antibiotics for him today we'll discharge him tomorrow and is to be limited weightbearing. I'll order an OrthoWedge shoe formed use for just transfer he's got a knee risk uterine home that I instructed him to use to stay off of it and keep weight off of it as much as possible. Change dressing today also donate wet-to-dry dressing with lots of padding on the foot when he goes home. Also recommended padding and dressing the skin tear on his anterior leg with an Ace wrap compression to the foot and leg.  Active Problems:   Cellulitis of left foot  Family Communication: Plan discussed with patient     Perry Mount DPM on 03/06/2017 at 1:08 PM

## 2017-03-07 LAB — GLUCOSE, CAPILLARY
GLUCOSE-CAPILLARY: 139 mg/dL — AB (ref 65–99)
GLUCOSE-CAPILLARY: 154 mg/dL — AB (ref 65–99)

## 2017-03-07 LAB — HIV ANTIBODY (ROUTINE TESTING W REFLEX): HIV Screen 4th Generation wRfx: NONREACTIVE

## 2017-03-07 MED ORDER — DOXYCYCLINE HYCLATE 100 MG PO CAPS: 100.0000 mg | ORAL_CAPSULE | Freq: Two times a day (BID) | ORAL | 0 refills | Status: AC

## 2017-03-07 MED ORDER — OXYCODONE HCL 5 MG PO TABS: 5.0000 mg | ORAL_TABLET | Freq: Four times a day (QID) | ORAL | 0 refills | Status: DC | PRN

## 2017-03-07 MED ORDER — CIPROFLOXACIN HCL 500 MG PO TABS: 500.0000 mg | ORAL_TABLET | Freq: Two times a day (BID) | ORAL | 0 refills | Status: AC

## 2017-03-07 NOTE — Discharge Instructions (Signed)
He is daily wet-to-dry dressing changes with a wrap going from the forefoot all the way up to the below the knee area with some compression with an Ace wrap following up to keep the swelling under control

## 2017-03-07 NOTE — Progress Notes (Signed)
Patient being discharged to home.  IV removed, DC and RX instructions given and patient and wife acknowledged understanding. They had no additional questions. Nurse gave patients wife instructions on how to apply wet to dry bandages for daily changes. Wife works as a Technical brewer and feels very comfortable with this.

## 2017-03-07 NOTE — Care Management Note (Addendum)
Case Management Note  Patient Details  Name: [name] MRN: [id number] Date of Birth: July 06, 1960  Subjective/Objective: Per Podiatry patient will need home health and daily dressing changes at discharge. Met with patient at bedside. Prior to admission, patient was independent and working. He states he will be home bound at this point as he is non weight bearing and wants his wound to heal. Wife is a CMA so can learn how to do dressing changes. At this time, I am checking with his insurance to see what home health agencies can accept patient. Patient in agreement with POC.  patient has a knee scooter.                  Action/Plan: Will follow up  Expected Discharge Date:                  Expected Discharge Plan:  [facility]  In-House Referral:     Discharge planning Services  CM Consult  Post Acute Care Choice:  Home Health Choice offered to:  Patient  DME Arranged:    DME Agency:     HH Arranged:  RN [place] Agency:     Status of Service:  In process, will continue to follow  If discussed at Long Length of Stay Meetings, dates discussed:    Additional Comments:  Jolly Mango, RN 03/07/2017, 9:00 AM

## 2017-03-07 NOTE — Care Management Note (Signed)
Case Management Note  Patient Details  Name: [name] MRN: [id number] Date of Birth: [birth year]  Subjective/Objective:  RNCM called all home health agencies and could not find one that would accept patient. Patients wife is very capable of doing dressing changes per the patient. Instructed primary nurse to instruct patient and his wife on dressing changes. Requested she provide patient with some supplies to go home with. Patient in agreement with POC.                    Action/Plan:   Expected Discharge Date:                  Expected Discharge Plan:  Home/Self Care  In-House Referral:     Discharge planning Services  CM Consult  Post Acute Care Choice:    Choice offered to:  Patient  DME Arranged:    DME Agency:     HH Arranged:    [place] Agency:     Status of Service:  Completed, signed off  If discussed at [employer] of Stay Meetings, dates discussed:    Additional Comments:  Jolly Mango, RN 03/07/2017, 1:57 PM

## 2017-03-07 NOTE — Progress Notes (Signed)
Patient Demographics  [name], is a 56 y.o. male   MRN: [id number]   DOB - 18-Sep-1960  Admit Date - 03/05/2017    Outpatient Primary MD for the patient is Gavin Pound, MD  Consult requested in the Hospital by Vaughan Basta, *, On 03/07/2017  With History of -  Past Medical History:  Diagnosis Date  . Arthritis   . BPH (benign prostatic hypertrophy)   . Diabetes mellitus   . Diabetic neuropathy ([place])   . Hypertension   . Metatarsal bone fracture    left 5th toe  . Osteomyelitis of toe of right foot ([place])   . Post-operative infection    and diabetic ulcer left foot  . Sleep apnea     does not wear CPAP  . Wears glasses       Past Surgical History:  Procedure Laterality Date  . COLONOSCOPY W/ BIOPSIES AND POLYPECTOMY    . I&D EXTREMITY Left 01/11/2016   Procedure: LEFT FOOT IRRIGATION AND DEBRIDEMENT WOUND VAC AND REMOVAL OF HARDWARE;  Surgeon: Wylene Simmer, MD;  Location: [facility];  Service: Orthopedics;  Laterality: Left;  . ORIF TOE FRACTURE Left 06/08/2015   Procedure: OPEN REDUCTION INTERNAL FIXATION (ORIF) LEFT FIFTH METATARSAL BASE FRACTURE NONUNION; CALCANEAL AUTOGRAFT ;  Surgeon: Wylene Simmer, MD;  Location: [facility];  Service: Orthopedics;  Laterality: Left;  . PATELLA RECONSTRUCTION Left 2005  . PROSTATE ABLATION  2014  . TOE AMPUTATION     partial amputation right great toe    in for   Chief Complaint  Patient presents with  . Foot Pain     HPI  [name]  is a 56 y.o. male, Abscess and ulceration diabetic wise to the left foot. I was able to debride it had bedside with excisional debridement and he is stable to this point his white count background normal limits and he is doing better overall. MRI and x-rays were negative for any type of bone involvement.  This is a recurrent lesion on the area.    Review of Systems    In addition to the HPI above,  No Fever-chills, No Headache, No changes with Vision or hearing, No problems swallowing food or Liquids, No Chest pain, Cough or Shortness of Breath, No Abdominal pain, No Nausea or Vommitting, Bowel movements are regular, No Blood in stool or Urine, No dysuria, No new skin rashes or bruises, No new joints pains-aches,  No new weakness, tingling, numbness in any extremity, No recent weight gain or loss, No polyuria, polydypsia or polyphagia, No significant Mental Stressors.  A full 10 point Review of Systems was done, except as stated above, all other Review of Systems were negative.   Social History Social History  Substance Use Topics  . Smoking status: Never Smoker  . Smokeless tobacco: Never Used  . Alcohol use Yes     Comment: occasional     Family History Family History  Problem Relation Age of Onset  . Diabetes Mother   . Diabetes Other    Prior to Admission medications   Medication Sig Start Date End Date Taking? Authorizing Provider  aspirin EC 81 MG tablet Take 81 mg  by mouth daily.   Yes [provider]  FARXIGA 10 MG TABS tablet Take 1 tablet by mouth daily. 03/03/17  Yes [provider]  gabapentin (NEURONTIN) 400 MG capsule Take 400 mg by mouth 2 (two) times daily.   Yes [provider]  Insulin Glargine (BASAGLAR KWIKPEN) 100 UNIT/ML SOPN Inject 0.35 mLs (35 Units total) into the skin at bedtime. 04/30/16  Yes Renato Shin, MD  losartan-hydrochlorothiazide (HYZAAR) 100-25 MG tablet Take 1 tablet by mouth daily. 12/24/15  Yes [provider]  metFORMIN (GLUCOPHAGE) 1000 MG tablet Take 1,000 mg by mouth 2 (two) times daily. 10/21/15  Yes [provider]  multivitamin-iron-minerals-folic acid (CENTRUM) chewable tablet Chew 1 tablet by mouth daily.   Yes [provider]  simvastatin (ZOCOR) 20 MG tablet TAKE 1  TABLET BY MOUTH IN THE EVENING 05/02/16  Yes Renato Shin, MD  TRADJENTA 5 MG TABS tablet Take 5 mg by mouth daily. 03/01/17  Yes [provider]  docusate sodium (COLACE) 100 MG capsule Take 1 capsule (100 mg total) by mouth 2 (two) times daily. While taking narcotic pain medicine. 01/12/16   Corky Sing, PA-C  insulin aspart (NOVOLOG) 100 UNIT/ML injection Inject 12-20 Units into the skin 3 (three) times daily before meals.    [provider]  insulin lispro (HUMALOG KWIKPEN) 100 UNIT/ML KiwkPen Inject 0.15 mLs (15 Units total) into the skin 3 (three) times daily with meals. And pen needles 4/day Patient not taking: Reported on 03/05/2017 04/30/16   Renato Shin, MD  oxyCODONE (ROXICODONE) 5 MG immediate release tablet Take 1-2 tablets (5-10 mg total) by mouth every 4 (four) hours as needed for moderate pain or severe pain. Patient not taking: Reported on 03/05/2017 01/12/16   Corky Sing, PA-C  senna (SENOKOT) 8.6 MG TABS tablet Take 2 tablets (17.2 mg total) by mouth 2 (two) times daily. 01/12/16   Corky Sing, PA-C    Anti-infectives    Start     Dose/Rate Route Frequency Ordered Stop   03/06/17 0300  vancomycin (VANCOCIN) 1,500 mg in sodium chloride 0.9 % 500 mL IVPB     1,500 mg 250 mL/hr over 120 Minutes Intravenous Every 12 hours 03/05/17 1944     03/06/17 0100  vancomycin (VANCOCIN) 1,500 mg in sodium chloride 0.9 % 500 mL IVPB  Status:  Discontinued     1,500 mg 250 mL/hr over 120 Minutes Intravenous Every 12 hours 03/05/17 1839 03/05/17 1944   03/05/17 1730  vancomycin (VANCOCIN) IVPB 1000 mg/200 mL premix     1,000 mg 200 mL/hr over 60 Minutes Intravenous  Once 03/05/17 1728     03/05/17 1730  ciprofloxacin (CIPRO) IVPB 400 mg     400 mg 200 mL/hr over 60 Minutes Intravenous  Once 03/05/17 1728 03/05/17 1913   03/05/17 0600  ciprofloxacin (CIPRO) IVPB 400 mg     400 mg 200 mL/hr over 60 Minutes Intravenous Every 12 hours 03/05/17 1826         Scheduled Meds: . aspirin EC  81 mg Oral Daily  . docusate sodium  100 mg Oral BID  . enoxaparin (LOVENOX) injection  40 mg Subcutaneous Q24H  . gabapentin  400 mg Oral BID  . losartan  100 mg Oral Daily   And  . hydrochlorothiazide  25 mg Oral Daily  . insulin aspart  0-15 Units Subcutaneous TID WC  . insulin aspart  0-5 Units Subcutaneous QHS  . insulin glargine  30 Units Subcutaneous  QHS  . linagliptin  5 mg Oral Daily  . senna  2 tablet Oral BID  . simvastatin  20 mg Oral QPM   Continuous Infusions: . ciprofloxacin Stopped (03/07/17 0814)  . vancomycin Stopped (03/07/17 0506)  . vancomycin     PRN Meds:.acetaminophen **OR** acetaminophen, albuterol, bisacodyl, ondansetron **OR** ondansetron (ZOFRAN) IV, oxyCODONE, polyethylene glycol  Allergies  Allergen Reactions  . No Known Allergies     Physical Exam  Vitals  Blood pressure 133/74, pulse 83, temperature 97.7 F (36.5 C), temperature source Oral, resp. rate 18, height 5\' 10"  (1.778 m), weight 122.1 kg (269 lb 1.6 oz), SpO2 100 %.  Lower Extremity exam:Dressing change and wound looks very good it's clean starting to granulate. The skin tear on his anterior leg was good Tuesdays control swelling around that area but dressings on that region also. Overall he is stable. He does have his OrthoWedge shoe and is able utilize up properly.  Data Review  CBC  Recent Labs Lab 03/05/17 1521 03/06/17 0414  WBC 11.4* 9.0  HGB 14.2 13.5  HCT 41.3 38.9*  PLT 214 205  MCV 90.1 90.7  MCH 30.9 31.6  MCHC 34.3 34.8  RDW 13.9 14.1  LYMPHSABS 2.4  --   MONOABS 1.1*  --   EOSABS 0.1  --   BASOSABS 0.1  --    ------------------------------------------------------------------------------------------------------------------  Chemistries   Recent Labs Lab 03/05/17 1521 03/06/17 0414  NA 138 138  K 4.0 3.7  CL 105 103  CO2 26 28  GLUCOSE 173* 99  BUN 15 14  CREATININE 1.02 0.92  CALCIUM 8.7* 8.4*  AST 37  --    ALT 66*  --   ALKPHOS 107  --   BILITOT 0.4  --    -------- Imaging results:   Mr Foot Left Wo Contrast  Result Date: 03/05/2017 CLINICAL DATA:  Diabetic patient with infected wound to the left foot. History of previous infection in the same area last year with MR SA and osteomyelitis. Treated with surgical debridement. EXAM: MRI OF THE LEFT FOOT WITHOUT CONTRAST TECHNIQUE: Multiplanar, multisequence MR imaging of the left foot was performed. No intravenous contrast was administered. COMPARISON:  05/05/2016 MRI. CT left foot 01/08/2016. Left foot radiographs 03/05/2017. FINDINGS: Bones/Joint/Cartilage There is deformity and sclerosis demonstrated in the proximal and midportion of the left fifth metatarsal bone. Adjacent susceptibility artifact consistent with postoperative change. No evidence of any bone marrow edema in this area. Changes are likely to represent postoperative and chronic deformity. No definite evidence of acute osteomyelitis. Degenerative changes in the intertarsal joints. Ligaments Limited visualization. No discrete ligamentous abnormalities identified. Muscles and Tendons Visualized extensor and flexor tendons and peroneal tendons appear intact. Soft tissues There is diffuse edema throughout the subcutaneous fat and soft tissues over the dorsum of the of the foot extending from the proximal metatarsal region through the metatarsal phalangeal region. Skin thickening, ulceration, and edema along the lateral aspect of the foot at the level of the proximal to mid fifth metatarsal bone. Changes are consistent with ulceration, edema, and cellulitis. No loculated fluid collections to suggest abscess. Note that evaluation for abscess or osteomyelitis is less sensitive without contrast material. IMPRESSION: Chronic sclerosis demonstrated in the proximal and mid left fifth metatarsal bone. No evidence of acute osteomyelitis. Soft tissue edema over the dorsum of the foot with skin thickening and  ulceration laterally. Changes likely representing cellulitis. No discrete abscess. Electronically Signed   By: Lucienne Capers M.D.   On:  03/05/2017 22:41   Dg Foot Complete Left  Result Date: 03/05/2017 CLINICAL DATA:  Left fifth metatarsal infection. EXAM: LEFT FOOT - COMPLETE 3+ VIEW COMPARISON:  Radiographs of June 19, 2016. FINDINGS: No fracture or dislocation is noted. Mild spurring of posterior calcaneus is noted. No lytic destruction is seen. Soft tissue thickening is seen over the proximal portion of the fifth metatarsal with ulceration. IMPRESSION: Probable ulceration seen in soft tissues overlying proximal portion of fifth metatarsal laterally. No lytic destruction is seen to suggest osteomyelitis. Electronically Signed   By: Marijo Conception, M.D.   On: 03/05/2017 16:11    Assessment & Plan: Overall the patient stable the cellulitis infections under better control. I gave instructions to use the OrthoWedge shoe appropriately by still wound using the respirator stay nonweightbearing as much as possible. I like to see him in about a week and a half in the office for follow-up. He is daily wet-to-dry dressing changes with a wrap going from the forefoot all the way up to the below the knee area with some compression with an Ace wrap following up to keep the swelling under control. Recommend he continue to take a combination of both Cipro and doxycycline. This is until we're able to gets final cultures back. He is growing on his Gram stain some gram-negative rods as well as gram positives. Recommend home health to get her started with dressing changes and materials.  Active Problems:   Cellulitis of left foot     Family Communication: Plan discussed with patient   Perry Mount M.D on 03/07/2017 at 8:26 AM  Thank you for the consult, we will follow the patient with you in the Hospital.

## 2017-03-10 LAB — AEROBIC/ANAEROBIC CULTURE (SURGICAL/DEEP WOUND)

## 2017-03-10 LAB — AEROBIC/ANAEROBIC CULTURE W GRAM STAIN (SURGICAL/DEEP WOUND)

## 2017-03-15 ENCOUNTER — Emergency Department
Admission: EM | Admit: 2017-03-15 | Discharge: 2017-03-15 | Disposition: A | Discharge status: Home / Self Care | Payer: Commercial Managed Care - PPO | Attending: Emergency Medicine | Admitting: Emergency Medicine

## 2017-03-15 ENCOUNTER — Emergency Department: Payer: Commercial Managed Care - PPO

## 2017-03-15 ENCOUNTER — Encounter: Payer: Self-pay | Admitting: Medical Oncology

## 2017-03-15 DIAGNOSIS — I10 Essential (primary) hypertension: Secondary | ICD-10-CM | POA: Diagnosis not present

## 2017-03-15 DIAGNOSIS — R2242 Localized swelling, mass and lump, left lower limb: Secondary | ICD-10-CM | POA: Insufficient documentation

## 2017-03-15 DIAGNOSIS — Z79899 Other long term (current) drug therapy: Secondary | ICD-10-CM | POA: Diagnosis not present

## 2017-03-15 DIAGNOSIS — Z7982 Long term (current) use of aspirin: Secondary | ICD-10-CM | POA: Diagnosis not present

## 2017-03-15 DIAGNOSIS — Z794 Long term (current) use of insulin: Secondary | ICD-10-CM | POA: Insufficient documentation

## 2017-03-15 DIAGNOSIS — E1140 Type 2 diabetes mellitus with diabetic neuropathy, unspecified: Secondary | ICD-10-CM | POA: Insufficient documentation

## 2017-03-15 DIAGNOSIS — Z5189 Encounter for other specified aftercare: Secondary | ICD-10-CM | POA: Diagnosis not present

## 2017-03-15 LAB — BASIC METABOLIC PANEL
Anion gap: 10 (ref 5–15)
BUN: 17 mg/dL (ref 6–20)
CO2: 25 mmol/L (ref 22–32)
CREATININE: 0.91 mg/dL (ref 0.61–1.24)
Calcium: 9.3 mg/dL (ref 8.9–10.3)
Chloride: 103 mmol/L (ref 101–111)
GFR calc Af Amer: >60 mL/min (ref >60)
GFR calc non Af Amer: >60 mL/min (ref >60)
Glucose, Bld: 137 mg/dL — ABNORMAL HIGH (ref 65–99)
POTASSIUM: 4.1 mmol/L (ref 3.5–5.1)
SODIUM: 138 mmol/L (ref 135–145)

## 2017-03-15 LAB — CBC WITH DIFFERENTIAL/PLATELET
Basophils Absolute: 0 10*3/uL (ref 0–0.1)
Basophils Relative: 1 %
EOS ABS: 0.1 10*3/uL (ref 0–0.7)
EOS PCT: 2 %
HCT: 41.3 % (ref 40.0–52.0)
HEMOGLOBIN: 14.3 g/dL (ref 13.0–18.0)
LYMPHS ABS: 2.3 10*3/uL (ref 1.0–3.6)
LYMPHS PCT: 28 %
MCH: 31.6 pg (ref 26.0–34.0)
MCHC: 34.6 g/dL (ref 32.0–36.0)
MCV: 91.4 fL (ref 80.0–100.0)
MONOS PCT: 8 %
Monocytes Absolute: 0.7 10*3/uL (ref 0.2–1.0)
NEUTROS PCT: 61 %
Neutro Abs: 5.1 10*3/uL (ref 1.4–6.5)
Platelets: 292 10*3/uL (ref 150–440)
RBC: 4.52 MIL/uL (ref 4.40–5.90)
RDW: 13.9 % (ref 11.5–14.5)
WBC: 8.2 10*3/uL (ref 3.8–10.6)

## 2017-03-15 LAB — SEDIMENTATION RATE: Sed Rate: 45 mm/hr — ABNORMAL HIGH (ref 0–20)

## 2017-03-15 NOTE — ED Notes (Signed)
Pt resting in bed on the phone. NAD noted. Will continue to monitor for further patient needs.

## 2017-03-15 NOTE — ED Notes (Signed)
X-ray at bedside at this time.

## 2017-03-15 NOTE — ED Notes (Signed)
Patient transported to Ultrasound 

## 2017-03-15 NOTE — ED Notes (Signed)
Wet to dry dressing applied to L foot by this RN, dressing with xeroform gauze and non-adherent gauze applied to L shin by this RN per MD order.

## 2017-03-15 NOTE — ED Notes (Signed)
NAD noted at time of D/C. Pt denies questions or concerns. Pt ambulatory to the lobby at this time. Pt refused wheelchair to the lobby.  

## 2017-03-15 NOTE — ED Triage Notes (Signed)
Pt reports wound infection to left lower leg that he was hospitalized for about 1 week ago, pt reports no improvement in wound and he had an injury to area that made it worse. Pt still taking abx at home.

## 2017-03-15 NOTE — ED Provider Notes (Addendum)
[facility] Emergency Department Provider Note   ____________________________________________   First MD Initiated Contact with Patient 03/15/17 [phone]     (approximate)  I have reviewed the triage vital signs and the nursing notes.   HISTORY  Chief Complaint Wound Infection    HPI [name] is a 56 y.o. male who complains of pain in his foot wound and then reports he injured his chin and now turning worse. He still taking his antibiotics at home. He said some increased swelling in the leg. Pain really hasn't changed any. Patient has no fever. There is no discharge.   Past Medical History:  Diagnosis Date  . Arthritis   . BPH (benign prostatic hypertrophy)   . Diabetes mellitus   . Diabetic neuropathy ([place])   . Hypertension   . Metatarsal bone fracture    left 5th toe  . Osteomyelitis of toe of right foot ([place])   . Post-operative infection    and diabetic ulcer left foot  . Sleep apnea     does not wear CPAP  . Wears glasses     Patient Active Problem List   Diagnosis Date Noted  . Cellulitis of left foot 03/05/2017  . Surgery, elective   . Hardware complicating wound infection ([place])   . Diabetes mellitus due to underlying condition, uncontrolled, with diabetic neuropathy ([place])   . Metatarsal stress fracture of left foot 01/12/2016  . Diabetes mellitus due to underlying condition with diabetic polyneuropathy ([place]) 01/12/2016  . Diabetic infection of left foot ([place]) 01/12/2016  . Diabetic ulcer of foot associated with type 2 diabetes mellitus, with necrosis of muscle ([place]) 01/11/2016    Past Surgical History:  Procedure Laterality Date  . COLONOSCOPY W/ BIOPSIES AND POLYPECTOMY    . I&D EXTREMITY Left 01/11/2016   Procedure: LEFT FOOT IRRIGATION AND DEBRIDEMENT WOUND VAC AND REMOVAL OF HARDWARE;  Surgeon: Wylene Simmer, MD;  Location: [facility];  Service: Orthopedics;  Laterality: Left;  . ORIF TOE FRACTURE Left 06/08/2015   Procedure: OPEN  REDUCTION INTERNAL FIXATION (ORIF) LEFT FIFTH METATARSAL BASE FRACTURE NONUNION; CALCANEAL AUTOGRAFT ;  Surgeon: Wylene Simmer, MD;  Location: [place];  Service: Orthopedics;  Laterality: Left;  . PATELLA RECONSTRUCTION Left 2005  . PROSTATE ABLATION  2014  . TOE AMPUTATION     partial amputation right great toe    Prior to Admission medications   Medication Sig Start Date End Date Taking? Authorizing Provider  aspirin EC 81 MG tablet Take 81 mg by mouth daily.    [provider]  ciprofloxacin (CIPRO) 500 MG tablet Take 1 tablet (500 mg total) by mouth 2 (two) times daily. 03/07/17 03/17/17  Vaughan Basta, MD  docusate sodium (COLACE) 100 MG capsule Take 1 capsule (100 mg total) by mouth 2 (two) times daily. While taking narcotic pain medicine. 01/12/16   Corky Sing, PA-C  doxycycline (VIBRAMYCIN) 100 MG capsule Take 1 capsule (100 mg total) by mouth 2 (two) times daily. 03/07/17 03/17/17  Vaughan Basta, MD  FARXIGA 10 MG TABS tablet Take 1 tablet by mouth daily. 03/03/17   [provider]  gabapentin (NEURONTIN) 400 MG capsule Take 400 mg by mouth 2 (two) times daily.    [provider]  insulin aspart (NOVOLOG) 100 UNIT/ML injection Inject 12-20 Units into the skin 3 (three) times daily before meals.    [provider]  Insulin Glargine (BASAGLAR KWIKPEN) 100 UNIT/ML SOPN Inject 0.35 mLs (35 Units total) into  the skin at bedtime. 04/30/16   Renato Shin, MD  insulin lispro (HUMALOG KWIKPEN) 100 UNIT/ML KiwkPen Inject 0.15 mLs (15 Units total) into the skin 3 (three) times daily with meals. And pen needles 4/day Patient not taking: Reported on 03/05/2017 04/30/16   Renato Shin, MD  losartan-hydrochlorothiazide (HYZAAR) 100-25 MG tablet Take 1 tablet by mouth daily. 12/24/15   [provider]  metFORMIN (GLUCOPHAGE) 1000 MG tablet Take 1,000 mg by mouth 2 (two) times daily. 10/21/15   [provider]    multivitamin-iron-minerals-folic acid (CENTRUM) chewable tablet Chew 1 tablet by mouth daily.    [provider]  oxyCODONE (OXY IR/ROXICODONE) 5 MG immediate release tablet Take 1 tablet (5 mg total) by mouth every 6 (six) hours as needed for moderate pain. 03/07/17   Vaughan Basta, MD  senna (SENOKOT) 8.6 MG TABS tablet Take 2 tablets (17.2 mg total) by mouth 2 (two) times daily. 01/12/16   Corky Sing, PA-C  simvastatin (ZOCOR) 20 MG tablet TAKE 1 TABLET BY MOUTH IN THE EVENING 05/02/16   Renato Shin, MD  TRADJENTA 5 MG TABS tablet Take 5 mg by mouth daily. 03/01/17   [provider]    Allergies No known allergies  Family History  Problem Relation Age of Onset  . Diabetes Mother   . Diabetes Other     Social History Social History  Substance Use Topics  . Smoking status: Never Smoker  . Smokeless tobacco: Never Used  . Alcohol use Yes     Comment: occasional     Review of Systems  Constitutional: No fever/chills Eyes: No visual changes. ENT: No sore throat. Cardiovascular: Denies chest pain. Respiratory: Denies shortness of breath. Gastrointestinal: No abdominal pain.  No nausea, no vomiting.  No diarrhea.  No constipation. Genitourinary: Negative for dysuria. Musculoskeletal: Negative for back pain. Skin: Negative for rash. Neurological: Negative for headaches, focal weakness  ____________________________________________   PHYSICAL EXAM:  VITAL SIGNS: ED Triage Vitals  Enc Vitals Group     BP 03/15/17 0801 (!) 178/91     Pulse Rate 03/15/17 0801 (!) 105     Resp 03/15/17 0801 18     Temp 03/15/17 0801 97.8 F (36.6 C)     Temp Source 03/15/17 0801 Oral     SpO2 03/15/17 0801 96 %     Weight 03/15/17 0802 269 lb (122 kg)     Height 03/15/17 0802 5\' 10"  (1.778 m)     Head Circumference --      Peak Flow --      Pain Score 03/15/17 0801 10     Pain Loc --      Pain Edu? --      Excl. in [place]? --     Constitutional: Alert  and oriented. Well appearing and in no acute distress. Eyes: Conjunctivae are normal.  Head: Atraumatic. Nose: No congestion/rhinnorhea. Mouth/Throat: Mucous membranes are moist.  Oropharynx non-erythematous. Neck: No stridor. Cardiovascular: Normal rate, regular rhythm. Grossly normal heart sounds.  Good peripheral circulation. Respiratory: Normal respiratory effort.  No retractions. Lungs CTAB. Gastrointestinal: Soft and nontender. No distention. No abdominal bruits. No CVA tenderness. Musculoskeletal: Right leg appears normal. Left leg has a deep abrasion or skin avulsion over the lower shin about 3 inches long and half an inch wide. The leg was wrapped with gauze and in the rapid area including that wound and the wound on the foot is no swelling are marked gauze over above the area to the knee  there is about 1+ edema. Some slight warmth. The wound on the shin itself looks clean there is no pus is no odor. No color change in the skin really the foot wound also looks clean there is some whitened skin from a wet-to-dry dressing around it but otherwise looks good. The foot is slightly swollen good pulse warm No joint effusions. Neurologic:  Normal speech and language. No gross focal neurologic deficits are appreciated. No gait instability. Skin:  Skin is warm, dry and intact. No rash noted. Psychiatric: Mood and affect are normal. Speech and behavior are normal.  ____________________________________________   LABS (all labs ordered are listed, but only abnormal results are displayed)  Labs Reviewed  BASIC METABOLIC PANEL - Abnormal; Notable for the following:       Result Value   Glucose, Bld 137 (*)    All other components within normal limits  SEDIMENTATION RATE - Abnormal; Notable for the following:    Sed Rate 45 (*)    All other components within normal limits  CBC WITH DIFFERENTIAL/PLATELET    ____________________________________________  EKG   ____________________________________________  RADIOLOGY  Dg Tibia/fibula Left  Result Date: 03/15/2017 CLINICAL DATA:  Wound infection over lower leg. EXAM: LEFT TIBIA AND FIBULA - 2 VIEW COMPARISON:  None. FINDINGS: No fracture or dislocation. No bony erosion in the distal tibia or fibula. Lucency in the calcaneus is stable and of doubtful acute significance. IMPRESSION: No acute abnormality identified. Electronically Signed   By: Dorise Bullion III M.D   On: 03/15/2017 09:39   US Venous Img Lower Unilateral Left  Result Date: 03/15/2017 CLINICAL DATA:  Left leg pain and swelling over the last week. EXAM: Left LOWER EXTREMITY VENOUS DOPPLER ULTRASOUND TECHNIQUE: Gray-scale sonography with graded compression, as well as color Doppler and duplex ultrasound were performed to evaluate the lower extremity deep venous systems from the level of the common femoral vein and including the common femoral, femoral, profunda femoral, popliteal and calf veins including the posterior tibial, peroneal and gastrocnemius veins when visible. The superficial great saphenous vein was also interrogated. Spectral Doppler was utilized to evaluate flow at rest and with distal augmentation maneuvers in the common femoral, femoral and popliteal veins. COMPARISON:  None. FINDINGS: Contralateral Common Femoral Vein: Respiratory phasicity is normal and symmetric with the symptomatic side. No evidence of thrombus. Normal compressibility. Common Femoral Vein: No evidence of thrombus. Normal compressibility, respiratory phasicity and response to augmentation. Saphenofemoral Junction: No evidence of thrombus. Normal compressibility and flow on color Doppler imaging. Profunda Femoral Vein: No evidence of thrombus. Normal compressibility and flow on color Doppler imaging. Femoral Vein: No evidence of thrombus. Normal compressibility, respiratory phasicity and response to  augmentation. Popliteal Vein: No evidence of thrombus. Normal compressibility, respiratory phasicity and response to augmentation. Calf Veins: No evidence of thrombus. Normal compressibility and flow on color Doppler imaging. Superficial Great Saphenous Vein: No evidence of thrombus. Normal compressibility and flow on color Doppler imaging. Venous Reflux:  None. Other Findings: Bilateral inguinal lymph nodes incidentally noted measuring up to 3 cm. IMPRESSION: No evidence of DVT within the left lower extremity. Electronically Signed   By: Nelson Chimes M.D.   On: 03/15/2017 09:38   Dg Foot Complete Left  Result Date: 03/15/2017 CLINICAL DATA:  Wound infection to the left lower leg, hospitalized about a week ago. No improvement in wound. Recent injury to the location of the wound. EXAM: LEFT FOOT - COMPLETE 3+ VIEW COMPARISON:  X-ray March 05, 2017 and MRI March 05, 2017 FINDINGS: There is a skin defect over the base of the fifth metatarsal. This is more prominent in the interval. There is sclerosis and deformity in the proximal aspect of the fifth metatarsal with irregularity along the base of the fifth metatarsal seen on the lateral view. These findings are stable. No osteomyelitis was seen in this region on the MRI March 05, 2017. There is increasing soft tissue swelling over the dorsum of the foot on the lateral view. Vascular calcifications are noted. Lucency in the calcaneus is stable, of no acute significance. No fractures are identified. IMPRESSION: 1. Increasing soft tissue swelling over the dorsum of the foot. 2. The skin defect over the base of the fifth metatarsal is more prominent in the interval. Recommend clinical correlation. 3. Irregularity and sclerosis in the proximal fifth metatarsal is unchanged since March 05, 2017. An MRI at that time demonstrated no evidence of osteomyelitis. Electronically Signed   By: Dorise Bullion III M.D   On: 03/15/2017 09:37     ____________________________________________   PROCEDURES  Procedure(s) performed  Procedures  Critical Care performed:   ____________________________________________   INITIAL IMPRESSION / ASSESSMENT AND PLAN / ED COURSE  Pertinent labs & imaging results that were available during my care of the patient were reviewed by me and considered in my medical decision making (see chart for details).   Labs look good wound looks clean there is some swelling in the leg but no real redness compared to the other leg I will let the patient go have him return tomorrow for recheck and will be here tomorrow as well. Patient can come back if he is worse. On Monday he should follow-up with the wound clinic.   sedimentation rate is 45 which is what it was last time it was checked. I Would anticipate it going down but it hasn't gone up.   ____________________________________________   FINAL CLINICAL IMPRESSION(S) / ED DIAGNOSES  Final diagnoses:  Wound check, abscess   Actual diagnosis is wound check   NEW MEDICATIONS STARTED DURING THIS VISIT:  New Prescriptions   No medications on file     Note:  This document was prepared using Dragon voice recognition software and may include unintentional dictation errors.    Nena Polio, MD 03/15/17 1031    Nena Polio, MD 03/15/17 [phone]

## 2017-03-15 NOTE — Discharge Instructions (Signed)
Please return tomorrow for recheck. I will be here tomorrow and should be able to see you easily return sooner for fever more swelling or redness or increased pain. For now use Tylenol for the pain. Lab work and x-rays look okay. Monday please call the wound clinic and have them check on you.

## 2017-03-15 NOTE — Discharge Summary (Signed)
[name] at [place] NAME: [name]    MR#:  [id number]  DATE OF BIRTH:  1960/07/08  DATE OF ADMISSION:  03/05/2017 ADMITTING PHYSICIAN: Hillary Bow, MD  DATE OF DISCHARGE: 03/07/2017  2:55 PM  PRIMARY CARE PHYSICIAN: System, Pcp Not In    ADMISSION DIAGNOSIS:  Ulcer of left foot, unspecified ulcer stage ([place]) [L97.529]  DISCHARGE DIAGNOSIS:  Active Problems:   Cellulitis of left foot   SECONDARY DIAGNOSIS:   Past Medical History:  Diagnosis Date  . Arthritis   . BPH (benign prostatic hypertrophy)   . Diabetes mellitus   . Diabetic neuropathy ([place])   . Hypertension   . Metatarsal bone fracture    left 5th toe  . Osteomyelitis of toe of right foot ([place])   . Post-operative infection    and diabetic ulcer left foot  . Sleep apnea     does not wear CPAP  . Wears glasses     HOSPITAL COURSE:   * Left foot cellulitis/ulcerwith abscess X-ray and MRI does not show osteomyelitis.   Appreciate podiatry input. Bedside debridement and cultures done.   On IV ciprofloxacin and vancomycin. History of MRSA. MRSA screen negative. As per podiatry, cont IV ABx for 2 days then switch to oral and d/c.  * Insulin-dependent diabetes mellitus. Will reduce home dose of Lantus to 30 units. Sliding scale insulin. Diabetic diet.   * Hypertension. Continue home medications.  * DVT prophylaxis with Lovenox   DISCHARGE CONDITIONS:   Stable.  CONSULTS OBTAINED:  Treatment Team:  Albertine Patricia, DPM  DRUG ALLERGIES:   Allergies  Allergen Reactions  . No Known Allergies     DISCHARGE MEDICATIONS:   Discharge Medication List as of 03/07/2017  2:32 PM    START taking these medications   Details  ciprofloxacin (CIPRO) 500 MG tablet Take 1 tablet (500 mg total) by mouth 2 (two) times daily., Starting Fri 03/07/2017, Until Mon 03/17/2017, Print    doxycycline (VIBRAMYCIN) 100 MG capsule Take 1 capsule (100 mg total) by  mouth 2 (two) times daily., Starting Fri 03/07/2017, Until Mon 03/17/2017, Print      CONTINUE these medications which have CHANGED   Details  oxyCODONE (OXY IR/ROXICODONE) 5 MG immediate release tablet Take 1 tablet (5 mg total) by mouth every 6 (six) hours as needed for moderate pain., Starting Fri 03/07/2017, Print      CONTINUE these medications which have NOT CHANGED   Details  aspirin EC 81 MG tablet Take 81 mg by mouth daily., Until Discontinued, Historical Med    docusate sodium (COLACE) 100 MG capsule Take 1 capsule (100 mg total) by mouth 2 (two) times daily. While taking narcotic pain medicine., Starting Fri 01/12/2016, Print    FARXIGA 10 MG TABS tablet Take 1 tablet by mouth daily., Starting Mon 03/03/2017, Historical Med    gabapentin (NEURONTIN) 400 MG capsule Take 400 mg by mouth 2 (two) times daily., Historical Med    insulin aspart (NOVOLOG) 100 UNIT/ML injection Inject 12-20 Units into the skin 3 (three) times daily before meals., Historical Med    Insulin Glargine (BASAGLAR KWIKPEN) 100 UNIT/ML SOPN Inject 0.35 mLs (35 Units total) into the skin at bedtime., Starting Tue 04/30/2016, Normal    insulin lispro (HUMALOG KWIKPEN) 100 UNIT/ML KiwkPen Inject 0.15 mLs (15 Units total) into the skin 3 (three) times daily with meals. And pen needles 4/day, Starting Tue 04/30/2016, Normal    losartan-hydrochlorothiazide (HYZAAR) 100-25 MG  tablet Take 1 tablet by mouth daily., Starting 12/24/2015, Until Discontinued, Historical Med    metFORMIN (GLUCOPHAGE) 1000 MG tablet Take 1,000 mg by mouth 2 (two) times daily., Starting 10/21/2015, Until Discontinued, Historical Med    multivitamin-iron-minerals-folic acid (CENTRUM) chewable tablet Chew 1 tablet by mouth daily., Until Discontinued, Historical Med    senna (SENOKOT) 8.6 MG TABS tablet Take 2 tablets (17.2 mg total) by mouth 2 (two) times daily., Starting Fri 01/12/2016, Print    simvastatin (ZOCOR) 20 MG tablet TAKE 1 TABLET BY MOUTH  IN THE EVENING, Normal    TRADJENTA 5 MG TABS tablet Take 5 mg by mouth daily., Starting Sat 03/01/2017, Historical Med         DISCHARGE INSTRUCTIONS:    Follow with podiatry clinic in 1-2 weeks.  If you experience worsening of your admission symptoms, develop shortness of breath, life threatening emergency, suicidal or homicidal thoughts you must seek medical attention immediately by calling 911 or calling your MD immediately  if symptoms less severe.  You Must read complete instructions/literature along with all the possible adverse reactions/side effects for all the Medicines you take and that have been prescribed to you. Take any new Medicines after you have completely understood and accept all the possible adverse reactions/side effects.   Please note  You were cared for by a hospitalist during your hospital stay. If you have any questions about your discharge medications or the care you received while you were in the hospital after you are discharged, you can call the unit and asked to speak with the hospitalist on call if the hospitalist that took care of you is not available. Once you are discharged, your primary care physician will handle any further medical issues. Please note that NO REFILLS for any discharge medications will be authorized once you are discharged, as it is imperative that you return to your primary care physician (or establish a relationship with a primary care physician if you do not have one) for your aftercare needs so that they can reassess your need for medications and monitor your lab values.    Today   CHIEF COMPLAINT:   Chief Complaint  Patient presents with  . Foot Pain    HISTORY OF PRESENT ILLNESS:  [name]  is a 56 y.o. male with a known history of Diabetes, hypertension, chronic left foot ulcer, diabetic neuropathy presents to the emergency room sent in by his podiatrist due to worsening left foot ulcer. Patient had left fifth foot area of  ulceration with osteomyelitis and debridement in 2016. had a wound VAC placed and the wound improved well. But recently this has started worsening with purulent discharge. Here in the emergency room podiatry has been called in and bedside debridement is done. Dressing placed. Patient will be admitted for IV antibiotics and MRI to rule out osteomyelitis.  VITAL SIGNS:  Blood pressure 133/74, pulse 83, temperature 97.7 F (36.5 C), temperature source Oral, resp. rate 18, height 5\' 10"  (1.778 m), weight 122.1 kg (269 lb 1.6 oz), SpO2 100 %.  I/O:  No intake or output data in the 24 hours ending 03/15/17 1216  PHYSICAL EXAMINATION:   GENERAL:  56 y.o.-year-old patient lying in the bed with no acute distress.  EYES: Pupils equal, round, reactive to light and accommodation. No scleral icterus. Extraocular muscles intact.  HEENT: Head atraumatic, normocephalic. Oropharynx and nasopharynx clear.  NECK:  Supple, no jugular venous distention. No thyroid enlargement, no tenderness.  LUNGS: Normal breath sounds bilaterally,  no wheezing, rales,rhonchi or crepitation. No use of accessory muscles of respiration.  CARDIOVASCULAR: S1, S2 normal. No murmurs, rubs, or gallops.  ABDOMEN: Soft, nontender, nondistended. Bowel sounds present. No organomegaly or mass.  EXTREMITIES: No pedal edema, cyanosis, or clubbing. Dressing on left foot and leg NEUROLOGIC: Cranial nerves II through XII are intact. Muscle strength 5/5 in all extremities. Sensation intact. Gait not checked.  PSYCHIATRIC: The patient is alert and oriented x 3.  SKIN: No obvious rash, lesion, or ulcer.   DATA REVIEW:   CBC  Recent Labs Lab 03/15/17 0829  WBC 8.2  HGB 14.3  HCT 41.3  PLT 292    Chemistries   Recent Labs Lab 03/15/17 0829  NA 138  K 4.1  CL 103  CO2 25  GLUCOSE 137*  BUN 17  CREATININE 0.91  CALCIUM 9.3    Cardiac Enzymes No results for input(s): TROPONINI in the last 168 hours.  Microbiology Results   Results for orders placed or performed during the hospital encounter of 03/05/17  Aerobic/Anaerobic Culture (surgical/deep wound)     Status: None   Collection Time: 03/05/17  5:55 PM  Result Value Ref Range Status   Specimen Description WOUND  Final   Special Requests LEFT FOOT  Final   Gram Stain   Final    RARE WBC PRESENT, PREDOMINANTLY MONONUCLEAR FEW GRAM NEGATIVE RODS RARE GRAM POSITIVE COCCI IN PAIRS    Culture   Final    MODERATE MORGANELLA MORGANII MODERATE ESCHERICHIA COLI MODERATE PSEUDOMONAS AERUGINOSA NO ANAEROBES ISOLATED Performed at [facility], [place] [address], [place], [place] [address]    Report Status 03/10/2017 FINAL  Final   Organism ID, Bacteria MORGANELLA MORGANII  Final   Organism ID, Bacteria ESCHERICHIA COLI  Final   Organism ID, Bacteria PSEUDOMONAS AERUGINOSA  Final      Susceptibility   Escherichia coli - MIC*    AMPICILLIN <=2 SENSITIVE Sensitive     CEFAZOLIN <=4 SENSITIVE Sensitive     CEFEPIME <=1 SENSITIVE Sensitive     CEFTAZIDIME <=1 SENSITIVE Sensitive     CEFTRIAXONE <=1 SENSITIVE Sensitive     CIPROFLOXACIN <=0.25 SENSITIVE Sensitive     GENTAMICIN <=1 SENSITIVE Sensitive     IMIPENEM <=0.25 SENSITIVE Sensitive     TRIMETH/SULFA <=20 SENSITIVE Sensitive     AMPICILLIN/SULBACTAM <=2 SENSITIVE Sensitive     PIP/TAZO <=4 SENSITIVE Sensitive     Extended ESBL NEGATIVE Sensitive     * MODERATE ESCHERICHIA COLI   Morganella morganii - MIC*    AMPICILLIN >=32 RESISTANT Resistant     CEFAZOLIN >=64 RESISTANT Resistant     CEFEPIME <=1 SENSITIVE Sensitive     CEFTAZIDIME <=1 SENSITIVE Sensitive     CEFTRIAXONE <=1 SENSITIVE Sensitive     CIPROFLOXACIN <=0.25 SENSITIVE Sensitive     GENTAMICIN <=1 SENSITIVE Sensitive     IMIPENEM 2 SENSITIVE Sensitive     TRIMETH/SULFA <=20 SENSITIVE Sensitive     AMPICILLIN/SULBACTAM 16 INTERMEDIATE Intermediate     PIP/TAZO <=4 SENSITIVE Sensitive     * MODERATE MORGANELLA MORGANII    Pseudomonas aeruginosa - MIC*    CEFTAZIDIME 4 SENSITIVE Sensitive     CIPROFLOXACIN <=0.25 SENSITIVE Sensitive     GENTAMICIN <=1 SENSITIVE Sensitive     IMIPENEM 2 SENSITIVE Sensitive     PIP/TAZO <=4 SENSITIVE Sensitive     CEFEPIME <=1 SENSITIVE Sensitive     * MODERATE PSEUDOMONAS AERUGINOSA  MRSA PCR Screening     Status:  None   Collection Time: 03/06/17 12:56 AM  Result Value Ref Range Status   MRSA by PCR NEGATIVE NEGATIVE Final    Comment:        The GeneXpert MRSA Assay (FDA approved for NASAL specimens only), is one component of a comprehensive MRSA colonization surveillance program. It is not intended to diagnose MRSA infection nor to guide or monitor treatment for MRSA infections.     RADIOLOGY:  Dg Tibia/fibula Left  Result Date: 03/15/2017 CLINICAL DATA:  Wound infection over lower leg. EXAM: LEFT TIBIA AND FIBULA - 2 VIEW COMPARISON:  None. FINDINGS: No fracture or dislocation. No bony erosion in the distal tibia or fibula. Lucency in the calcaneus is stable and of doubtful acute significance. IMPRESSION: No acute abnormality identified. Electronically Signed   By: Dorise Bullion III M.D   On: 03/15/2017 09:39   US Venous Img Lower Unilateral Left  Result Date: 03/15/2017 CLINICAL DATA:  Left leg pain and swelling over the last week. EXAM: Left LOWER EXTREMITY VENOUS DOPPLER ULTRASOUND TECHNIQUE: Gray-scale sonography with graded compression, as well as color Doppler and duplex ultrasound were performed to evaluate the lower extremity deep venous systems from the level of the common femoral vein and including the common femoral, femoral, profunda femoral, popliteal and calf veins including the posterior tibial, peroneal and gastrocnemius veins when visible. The superficial great saphenous vein was also interrogated. Spectral Doppler was utilized to evaluate flow at rest and with distal augmentation maneuvers in the common femoral, femoral and popliteal veins.  COMPARISON:  None. FINDINGS: Contralateral Common Femoral Vein: Respiratory phasicity is normal and symmetric with the symptomatic side. No evidence of thrombus. Normal compressibility. Common Femoral Vein: No evidence of thrombus. Normal compressibility, respiratory phasicity and response to augmentation. Saphenofemoral Junction: No evidence of thrombus. Normal compressibility and flow on color Doppler imaging. Profunda Femoral Vein: No evidence of thrombus. Normal compressibility and flow on color Doppler imaging. Femoral Vein: No evidence of thrombus. Normal compressibility, respiratory phasicity and response to augmentation. Popliteal Vein: No evidence of thrombus. Normal compressibility, respiratory phasicity and response to augmentation. Calf Veins: No evidence of thrombus. Normal compressibility and flow on color Doppler imaging. Superficial Great Saphenous Vein: No evidence of thrombus. Normal compressibility and flow on color Doppler imaging. Venous Reflux:  None. Other Findings: Bilateral inguinal lymph nodes incidentally noted measuring up to 3 cm. IMPRESSION: No evidence of DVT within the left lower extremity. Electronically Signed   By: Nelson Chimes M.D.   On: 03/15/2017 09:38   Dg Foot Complete Left  Result Date: 03/15/2017 CLINICAL DATA:  Wound infection to the left lower leg, hospitalized about a week ago. No improvement in wound. Recent injury to the location of the wound. EXAM: LEFT FOOT - COMPLETE 3+ VIEW COMPARISON:  X-ray March 05, 2017 and MRI March 05, 2017 FINDINGS: There is a skin defect over the base of the fifth metatarsal. This is more prominent in the interval. There is sclerosis and deformity in the proximal aspect of the fifth metatarsal with irregularity along the base of the fifth metatarsal seen on the lateral view. These findings are stable. No osteomyelitis was seen in this region on the MRI March 05, 2017. There is increasing soft tissue swelling over the dorsum of  the foot on the lateral view. Vascular calcifications are noted. Lucency in the calcaneus is stable, of no acute significance. No fractures are identified. IMPRESSION: 1. Increasing soft tissue swelling over the dorsum of the foot. 2. The skin defect  over the base of the fifth metatarsal is more prominent in the interval. Recommend clinical correlation. 3. Irregularity and sclerosis in the proximal fifth metatarsal is unchanged since March 05, 2017. An MRI at that time demonstrated no evidence of osteomyelitis. Electronically Signed   By: Dorise Bullion III M.D   On: 03/15/2017 09:37    EKG:   Orders placed or performed during the hospital encounter of 06/08/15  . EKG 12 lead  . EKG 12 lead      Management plans discussed with the patient, family and they are in agreement.  CODE STATUS:  Code Status History    Date Active Date Inactive Code Status Order ID Comments User Context   03/05/2017  6:31 PM 03/07/2017  7:14 PM Full Code [id number]  Hillary Bow, MD ED   01/11/2016  6:45 PM 01/15/2016  7:16 PM Full Code [id number]  Wylene Simmer, MD Inpatient      TOTAL TIME TAKING CARE OF THIS PATIENT: 35 minutes.    Vaughan Basta M.D on 03/15/2017 at 12:16 PM  Between 7am to 6pm - Pager - [phone]  After 6pm go to [email or web address] - password [email or web address] [facility]  Office  [phone]  CC: Primary care physician; System, Pcp Not In   Note: This dictation was prepared with Dragon dictation along with smaller phrase technology. Any transcriptional errors that result from this process are unintentional.

## 2017-03-15 NOTE — ED Notes (Signed)
Vital signs obtained at this time. Pt resting in bed. Denies any needs at this time. Will continue to monitor for further patient needs.

## 2017-03-16 ENCOUNTER — Emergency Department
Admission: EM | Admit: 2017-03-16 | Discharge: 2017-03-16 | Disposition: A | Discharge status: Home / Self Care | Payer: Commercial Managed Care - PPO | Attending: Emergency Medicine | Admitting: Emergency Medicine

## 2017-03-16 ENCOUNTER — Encounter: Payer: Self-pay | Admitting: Emergency Medicine

## 2017-03-16 DIAGNOSIS — Z79899 Other long term (current) drug therapy: Secondary | ICD-10-CM | POA: Insufficient documentation

## 2017-03-16 DIAGNOSIS — Z4800 Encounter for change or removal of nonsurgical wound dressing: Secondary | ICD-10-CM | POA: Diagnosis present

## 2017-03-16 DIAGNOSIS — I10 Essential (primary) hypertension: Secondary | ICD-10-CM | POA: Insufficient documentation

## 2017-03-16 DIAGNOSIS — Z7982 Long term (current) use of aspirin: Secondary | ICD-10-CM | POA: Insufficient documentation

## 2017-03-16 DIAGNOSIS — Z794 Long term (current) use of insulin: Secondary | ICD-10-CM | POA: Insufficient documentation

## 2017-03-16 DIAGNOSIS — E11621 Type 2 diabetes mellitus with foot ulcer: Secondary | ICD-10-CM | POA: Insufficient documentation

## 2017-03-16 DIAGNOSIS — L97509 Non-pressure chronic ulcer of other part of unspecified foot with unspecified severity: Secondary | ICD-10-CM | POA: Diagnosis not present

## 2017-03-16 DIAGNOSIS — E1140 Type 2 diabetes mellitus with diabetic neuropathy, unspecified: Secondary | ICD-10-CM | POA: Diagnosis not present

## 2017-03-16 DIAGNOSIS — Z5189 Encounter for other specified aftercare: Secondary | ICD-10-CM

## 2017-03-16 NOTE — Discharge Instructions (Signed)
Follow-up with the wound care center for continued care of wounds to the left lower leg.

## 2017-03-16 NOTE — ED Triage Notes (Signed)
Patient see here for wound check

## 2017-03-16 NOTE — ED Provider Notes (Signed)
[facility] Emergency Department Provider Note   ____________________________________________   I have reviewed the triage vital signs and the nursing notes.   HISTORY  Chief Complaint Wound Check    HPI [name] is a 56 y.o. male presents for a wound recheck of a chronic wound along the anterior lower leg that was evaluated while in the emergency department yesterday. Patient denies any new symptoms or changes to the wound since yesterday. Patient stated he came to the emergency department yesterday for his wound on the left foot and delayed healing of the wound along the left shin. Patient reports tomorrow his his last day for his antibiotics. Patient acknowledged the recommendation to follow-up at the wound care center and states he plans to do so. Patient denies fever, chills, headache, vision changes, chest pain, chest tightness, shortness of breath, abdominal pain, nausea and vomiting.  Past Medical History:  Diagnosis Date  . Arthritis   . BPH (benign prostatic hypertrophy)   . Diabetes mellitus   . Diabetic neuropathy ([place])   . Hypertension   . Metatarsal bone fracture    left 5th toe  . Osteomyelitis of toe of right foot ([place])   . Post-operative infection    and diabetic ulcer left foot  . Sleep apnea     does not wear CPAP  . Wears glasses     Patient Active Problem List   Diagnosis Date Noted  . Cellulitis of left foot 03/05/2017  . Surgery, elective   . Hardware complicating wound infection ([place])   . Diabetes mellitus due to underlying condition, uncontrolled, with diabetic neuropathy ([place])   . Metatarsal stress fracture of left foot 01/12/2016  . Diabetes mellitus due to underlying condition with diabetic polyneuropathy ([place]) 01/12/2016  . Diabetic infection of left foot ([place]) 01/12/2016  . Diabetic ulcer of foot associated with type 2 diabetes mellitus, with necrosis of muscle ([place]) 01/11/2016    Past Surgical History:    Procedure Laterality Date  . COLONOSCOPY W/ BIOPSIES AND POLYPECTOMY    . I&D EXTREMITY Left 01/11/2016   Procedure: LEFT FOOT IRRIGATION AND DEBRIDEMENT WOUND VAC AND REMOVAL OF HARDWARE;  Surgeon: Wylene Simmer, MD;  Location: [place];  Service: Orthopedics;  Laterality: Left;  . ORIF TOE FRACTURE Left 06/08/2015   Procedure: OPEN REDUCTION INTERNAL FIXATION (ORIF) LEFT FIFTH METATARSAL BASE FRACTURE NONUNION; CALCANEAL AUTOGRAFT ;  Surgeon: Wylene Simmer, MD;  Location: [place];  Service: Orthopedics;  Laterality: Left;  . PATELLA RECONSTRUCTION Left 2005  . PROSTATE ABLATION  2014  . TOE AMPUTATION     partial amputation right great toe    Prior to Admission medications   Medication Sig Start Date End Date Taking? Authorizing Provider  aspirin EC 81 MG tablet Take 81 mg by mouth daily.    [provider]  ciprofloxacin (CIPRO) 500 MG tablet Take 1 tablet (500 mg total) by mouth 2 (two) times daily. 03/07/17 03/17/17  Vaughan Basta, MD  docusate sodium (COLACE) 100 MG capsule Take 1 capsule (100 mg total) by mouth 2 (two) times daily. While taking narcotic pain medicine. 01/12/16   Corky Sing, PA-C  doxycycline (VIBRAMYCIN) 100 MG capsule Take 1 capsule (100 mg total) by mouth 2 (two) times daily. 03/07/17 03/17/17  Vaughan Basta, MD  FARXIGA 10 MG TABS tablet Take 1 tablet by mouth daily. 03/03/17   [provider]  gabapentin (NEURONTIN) 400 MG capsule Take 400 mg by mouth 2 (two) times  daily.    [provider]  insulin aspart (NOVOLOG) 100 UNIT/ML injection Inject 12-20 Units into the skin 3 (three) times daily before meals.    [provider]  Insulin Glargine (BASAGLAR KWIKPEN) 100 UNIT/ML SOPN Inject 0.35 mLs (35 Units total) into the skin at bedtime. 04/30/16   Renato Shin, MD  insulin lispro (HUMALOG KWIKPEN) 100 UNIT/ML KiwkPen Inject 0.15 mLs (15 Units total) into the skin 3 (three) times daily with meals. And pen  needles 4/day Patient not taking: Reported on 03/05/2017 04/30/16   Renato Shin, MD  losartan-hydrochlorothiazide (HYZAAR) 100-25 MG tablet Take 1 tablet by mouth daily. 12/24/15   [provider]  metFORMIN (GLUCOPHAGE) 1000 MG tablet Take 1,000 mg by mouth 2 (two) times daily. 10/21/15   [provider]  multivitamin-iron-minerals-folic acid (CENTRUM) chewable tablet Chew 1 tablet by mouth daily.    [provider]  oxyCODONE (OXY IR/ROXICODONE) 5 MG immediate release tablet Take 1 tablet (5 mg total) by mouth every 6 (six) hours as needed for moderate pain. 03/07/17   Vaughan Basta, MD  senna (SENOKOT) 8.6 MG TABS tablet Take 2 tablets (17.2 mg total) by mouth 2 (two) times daily. 01/12/16   Corky Sing, PA-C  simvastatin (ZOCOR) 20 MG tablet TAKE 1 TABLET BY MOUTH IN THE EVENING 05/02/16   Renato Shin, MD  TRADJENTA 5 MG TABS tablet Take 5 mg by mouth daily. 03/01/17   [provider]    Allergies No known allergies  Family History  Problem Relation Age of Onset  . Diabetes Mother   . Diabetes Other     Social History Social History  Substance Use Topics  . Smoking status: Never Smoker  . Smokeless tobacco: Never Used  . Alcohol use Yes     Comment: occasional     Review of Systems Constitutional: Negative for fever/chills Eyes: No visual changes. ENT:  Negative for sore throat and for difficulty swallowing Cardiovascular: Denies chest pain. Respiratory: Denies cough. Denies shortness of breath. Gastrointestinal: No abdominal pain.  No nausea, vomiting, diarrhea. Genitourinary: Negative for dysuria. Musculoskeletal: Negative for back pain. Skin: Negative for rash. Chronic wound along the left anterior shin.  Neurological: Negative for headaches. Able to ambulate. ____________________________________________   PHYSICAL EXAM:  VITAL SIGNS: ED Triage Vitals  Enc Vitals Group     BP 03/16/17 0815 (!) 157/100     Pulse Rate  03/16/17 0815 (!) 105     Resp 03/16/17 0815 16     Temp 03/16/17 0815 97.7 F (36.5 C)     Temp Source 03/16/17 0815 Oral     SpO2 03/16/17 0815 97 %     Weight 03/16/17 0816 270 lb (122.5 kg)     Height 03/16/17 0816 5\' 10"  (1.778 m)     Head Circumference --      Peak Flow --      Pain Score 03/16/17 0813 4     Pain Loc --      Pain Edu? --      Excl. in [place]? --     Constitutional: Alert and oriented. Well appearing and in no acute distress.  Eyes: Conjunctivae are normal. PERRL. EOMI  Head: Normocephalic and atraumatic. ENT:      Ears: Canals clear. TMs intact bilaterally.      Nose: No congestion/rhinnorhea.      Mouth/Throat: Mucous membranes are moist.  Neck:Supple. No thyromegaly. No stridor.  Cardiovascular: Normal rate, regular rhythm. Normal S1 and S2.  Good peripheral circulation. Respiratory: Normal respiratory effort without tachypnea or retractions. Lungs CTAB. No wheezes/rales/rhonchi. Good air entry to the bases with no decreased or absent breath sounds. Hematological/Lymphatic/Immunological: No cervical lymphadenopathy. Cardiovascular: Normal rate, regular rhythm. Normal distal pulses. Gastrointestinal: Bowel sounds 4 quadrants. Soft and nontender to palpation. No guarding or rigidity. No palpable masses. No distention. No CVA tenderness. Musculoskeletal: Right leg appears normal. Left leg chronic wound secondary to deep abrasion or skin avulsion over the anterior lower shin ~7 cm x 3 cm. 1+ edema distal to the wound with slight warmth. Wound base with granulation tissue, sparse exudate. Wound margin with slight rounding. No odor or drainage noted.  Neurologic: Normal speech and language.  Skin:  Skin is warm, dry and intact. No rash noted. Psychiatric: Mood and affect are normal. Speech and behavior are normal. Patient exhibits appropriate insight and judgement.  ____________________________________________   LABS (all labs ordered are listed, but only abnormal  results are displayed)  Labs Reviewed - No data to display ____________________________________________  EKG none ____________________________________________  RADIOLOGY none ____________________________________________   PROCEDURES  Procedure(s) performed: none    Critical Care performed: no ____________________________________________   INITIAL IMPRESSION / ASSESSMENT AND PLAN / ED COURSE  Pertinent labs & imaging results that were available during my care of the patient were reviewed by me and considered in my medical decision making (see chart for details).  Patient presents to emergency department with wound check for wound over the left lower shin. Wound appears to be slowly healing without signs of infection. Wound redressed with Xeroform, gauze and roll gauze. Highly recommended patient follow up with the wound care center as soon as possible or return to the emergency department if symptoms return or worsen. Patient informed of clinical course, understand medical decision-making process, and agree with plan.      ____________________________________________   FINAL CLINICAL IMPRESSION(S) / ED DIAGNOSES  Final diagnoses:  Visit for wound check       NEW MEDICATIONS STARTED DURING THIS VISIT:  Discharge Medication List as of 03/16/2017  8:42 AM       Note:  This document was prepared using Dragon voice recognition software and may include unintentional dictation errors.    Jerolyn Shin, PA-C 03/16/17 1622    Harvest Dark, MD 03/17/17 [id number]

## 2017-03-16 NOTE — ED Notes (Addendum)
See triage note  States he is here for wound check  Wound undressed for provider  Noticed a skin tear to left lower leg  States he hit it on the bathtub about 1 week ago

## 2017-04-03 ENCOUNTER — Encounter: Payer: Self-pay | Admitting: Emergency Medicine

## 2017-04-03 ENCOUNTER — Emergency Department: Payer: Commercial Managed Care - PPO

## 2017-04-03 ENCOUNTER — Emergency Department
Admission: EM | Admit: 2017-04-03 | Discharge: 2017-04-03 | Disposition: A | Discharge status: Home / Self Care | Payer: Commercial Managed Care - PPO | Attending: Emergency Medicine | Admitting: Emergency Medicine

## 2017-04-03 DIAGNOSIS — I10 Essential (primary) hypertension: Secondary | ICD-10-CM | POA: Insufficient documentation

## 2017-04-03 DIAGNOSIS — L97411 Non-pressure chronic ulcer of right heel and midfoot limited to breakdown of skin: Secondary | ICD-10-CM | POA: Insufficient documentation

## 2017-04-03 DIAGNOSIS — E08621 Diabetes mellitus due to underlying condition with foot ulcer: Secondary | ICD-10-CM

## 2017-04-03 DIAGNOSIS — E11621 Type 2 diabetes mellitus with foot ulcer: Secondary | ICD-10-CM | POA: Diagnosis not present

## 2017-04-03 DIAGNOSIS — L03116 Cellulitis of left lower limb: Secondary | ICD-10-CM | POA: Diagnosis not present

## 2017-04-03 DIAGNOSIS — M79672 Pain in left foot: Secondary | ICD-10-CM | POA: Diagnosis present

## 2017-04-03 LAB — CBC WITH DIFFERENTIAL/PLATELET
BASOS PCT: 1 %
Basophils Absolute: 0.1 10*3/uL (ref 0–0.1)
EOS ABS: 0.2 10*3/uL (ref 0–0.7)
EOS PCT: 2 %
HCT: 42.5 % (ref 40.0–52.0)
Hemoglobin: 14.7 g/dL (ref 13.0–18.0)
LYMPHS ABS: 2.4 10*3/uL (ref 1.0–3.6)
Lymphocytes Relative: 23 %
MCH: 31.7 pg (ref 26.0–34.0)
MCHC: 34.5 g/dL (ref 32.0–36.0)
MCV: 91.7 fL (ref 80.0–100.0)
Monocytes Absolute: 0.9 10*3/uL (ref 0.2–1.0)
Monocytes Relative: 9 %
Neutro Abs: 6.6 10*3/uL — ABNORMAL HIGH (ref 1.4–6.5)
Neutrophils Relative %: 65 %
PLATELETS: 223 10*3/uL (ref 150–440)
RBC: 4.63 MIL/uL (ref 4.40–5.90)
RDW: 14 % (ref 11.5–14.5)
WBC: 10.1 10*3/uL (ref 3.8–10.6)

## 2017-04-03 LAB — COMPREHENSIVE METABOLIC PANEL
ALK PHOS: 70 U/L (ref 38–126)
ALT: 22 U/L (ref 17–63)
AST: 26 U/L (ref 15–41)
Albumin: 3.7 g/dL (ref 3.5–5.0)
Anion gap: 9 (ref 5–15)
BILIRUBIN TOTAL: 0.5 mg/dL (ref 0.3–1.2)
BUN: 18 mg/dL (ref 6–20)
CALCIUM: 8.8 mg/dL — AB (ref 8.9–10.3)
CHLORIDE: 103 mmol/L (ref 101–111)
CO2: 25 mmol/L (ref 22–32)
CREATININE: 1.14 mg/dL (ref 0.61–1.24)
GFR calc Af Amer: >60 mL/min (ref >60)
Glucose, Bld: 196 mg/dL — ABNORMAL HIGH (ref 65–99)
Potassium: 4.2 mmol/L (ref 3.5–5.1)
Sodium: 137 mmol/L (ref 135–145)
TOTAL PROTEIN: 7.5 g/dL (ref 6.5–8.1)

## 2017-04-03 LAB — GLUCOSE, CAPILLARY: GLUCOSE-CAPILLARY: 176 mg/dL — AB (ref 65–99)

## 2017-04-03 LAB — SEDIMENTATION RATE: Sed Rate: 37 mm/hr — ABNORMAL HIGH (ref 0–20)

## 2017-04-03 MED ORDER — OXYCODONE-ACETAMINOPHEN 5-325 MG PO TABS: 1.0000 | ORAL_TABLET | Freq: Four times a day (QID) | ORAL | 0 refills | Status: DC | PRN

## 2017-04-03 MED ORDER — SULFAMETHOXAZOLE-TRIMETHOPRIM 800-160 MG PO TABS
1.0000 | ORAL_TABLET | Freq: Once | ORAL | Status: AC
  Administered 2017-04-03: 1 via ORAL
  Filled 2017-04-03: qty 1

## 2017-04-03 MED ORDER — SULFAMETHOXAZOLE-TRIMETHOPRIM 800-160 MG PO TABS: 1.0000 | ORAL_TABLET | Freq: Two times a day (BID) | ORAL | 0 refills | Status: DC

## 2017-04-03 MED ORDER — BACITRACIN ZINC 500 UNIT/GM EX OINT
TOPICAL_OINTMENT | CUTANEOUS | Status: AC
  Filled 2017-04-03: qty 0.9

## 2017-04-03 NOTE — ED Provider Notes (Signed)
[facility] Emergency Department Provider Note       Time seen: ----------------------------------------- 4:18 PM on 04/03/2017 -----------------------------------------     I have reviewed the triage vital signs and the nursing notes.   HISTORY   Chief Complaint Wound Infection    HPI [name] is a 56 y.o. male who presents to the ED for left foot pain. Patient states have an open wound and I think it's infected. He said history of diabetic ulceration over the lateral aspect of the left foot on and off for some time. He has a pending appointment with the wound center in the next week. He complains of pain 7 out of 10 in the left foot but has not had any drainage. He states it had been dinning smaller but now is getting larger again  Past Medical History:  Diagnosis Date  . Arthritis   . BPH (benign prostatic hypertrophy)   . Diabetes mellitus   . Diabetic neuropathy ([place])   . Hypertension   . Metatarsal bone fracture    left 5th toe  . Osteomyelitis of toe of right foot ([place])   . Post-operative infection    and diabetic ulcer left foot  . Sleep apnea     does not wear CPAP  . Wears glasses     Patient Active Problem List   Diagnosis Date Noted  . Cellulitis of left foot 03/05/2017  . Surgery, elective   . Hardware complicating wound infection ([place])   . Diabetes mellitus due to underlying condition, uncontrolled, with diabetic neuropathy ([place])   . Metatarsal stress fracture of left foot 01/12/2016  . Diabetes mellitus due to underlying condition with diabetic polyneuropathy ([place]) 01/12/2016  . Diabetic infection of left foot ([place]) 01/12/2016  . Diabetic ulcer of foot associated with type 2 diabetes mellitus, with necrosis of muscle ([place]) 01/11/2016    Past Surgical History:  Procedure Laterality Date  . COLONOSCOPY W/ BIOPSIES AND POLYPECTOMY    . I&D EXTREMITY Left 01/11/2016   Procedure: LEFT FOOT IRRIGATION AND DEBRIDEMENT WOUND  VAC AND REMOVAL OF HARDWARE;  Surgeon: Wylene Simmer, MD;  Location: [place];  Service: Orthopedics;  Laterality: Left;  . ORIF TOE FRACTURE Left 06/08/2015   Procedure: OPEN REDUCTION INTERNAL FIXATION (ORIF) LEFT FIFTH METATARSAL BASE FRACTURE NONUNION; CALCANEAL AUTOGRAFT ;  Surgeon: Wylene Simmer, MD;  Location: [place];  Service: Orthopedics;  Laterality: Left;  . PATELLA RECONSTRUCTION Left 2005  . PROSTATE ABLATION  2014  . TOE AMPUTATION     partial amputation right great toe    Allergies No known allergies  Social History Social History  Substance Use Topics  . Smoking status: Never Smoker  . Smokeless tobacco: Never Used  . Alcohol use Yes     Comment: occasional     Review of Systems Constitutional: Negative for fever. Cardiovascular: Negative for chest pain. Respiratory: Negative for shortness of breath. Musculoskeletal: positive left foot pain Skin: Negative for rash. Neurological: Negative for headaches, focal weakness or numbness.  All systems negative/normal/unremarkable except as stated in the HPI  ____________________________________________   PHYSICAL EXAM:  VITAL SIGNS: ED Triage Vitals [04/03/17 1457]  Enc Vitals Group     BP (!) 148/78     Pulse Rate (!) 103     Resp 17     Temp 98.2 F (36.8 C)     Temp Source Oral     SpO2 94 %     Weight 270 lb (122.5 kg)  Height 5\' 10"  (1.778 m)     Head Circumference      Peak Flow      Pain Score 7     Pain Loc      Pain Edu?      Excl. in [place]?     Constitutional: Alert and oriented. Well appearing and in no distress. Eyes: Conjunctivae are normal. Normal extraocular movements. Cardiovascular: Normal rate, regular rhythm. No murmurs, rubs, or gallops. Respiratory: Normal respiratory effort without tachypnea nor retractions. Breath sounds are clear and equal bilaterally. No wheezes/rales/rhonchi. Musculoskeletal: there is tenderness and swelling over the lateral aspect of the left  midfoot. There is a chronic appearing ulceration with scar tissue and callus formation and around the wound. Neurologic:  Normal speech and language. No gross focal neurologic deficits are appreciated.  Skin:  left foot ulceration but no active drainage Psychiatric: Mood and affect are normal. Speech and behavior are normal.  ____________________________________________  ED COURSE:  Pertinent labs & imaging results that were available during my care of the patient were reviewed by me and considered in my medical decision making (see chart for details). Patient presents for diabetic foot ulceration, we will assess with labs and imaging as indicated.   Procedures ____________________________________________   LABS (pertinent positives/negatives)  Labs Reviewed  COMPREHENSIVE METABOLIC PANEL - Abnormal; Notable for the following:       Result Value   Glucose, Bld 196 (*)    Calcium 8.8 (*)    All other components within normal limits  CBC WITH DIFFERENTIAL/PLATELET - Abnormal; Notable for the following:    Neutro Abs 6.6 (*)    All other components within normal limits  GLUCOSE, CAPILLARY - Abnormal; Notable for the following:    Glucose-Capillary 176 (*)    All other components within normal limits    RADIOLOGY  left foot x-rays reveal  IMPRESSION: Soft tissue ulceration is seen overlying proximal base of fifth metatarsal. Irregularity involving the proximal base of the fifth metatarsal is noted and osteomyelitis cannot be excluded. Repeat MRI may be performed for further evaluation. MRI of the left foot does not reveal any interval change from recent MRI. No obvious signs of osteomyelitis ____________________________________________  DIFFERENTIAL DIAGNOSIS   cellulitis, diabetic ulceration, osteomyelitis, fracture, retained foreign body   FINAL ASSESSMENT AND PLAN  diabetic foot ulceration   Plan: Patient's labs and imaging were dictated above. Patient had presented  for foot ulceration and swelling. He likely has some cellulitis developing and for insurance reasons I'll place him on Bactrim DS. He is advised to use topical antibiotic ointment daily and I'll prescribe some pain medicine. He has outpatient follow-up scheduled with the wound clinic.   Earleen Newport, MD   Note: This note was generated in part or whole with voice recognition software. Voice recognition is usually quite accurate but there are transcription errors that can and very often do occur. I apologize for any typographical errors that were not detected and corrected.     Earleen Newport, MD 04/03/17 2139

## 2017-04-03 NOTE — ED Triage Notes (Signed)
Patient presents to ED via POV from home with c/o left foot pain. Patients states, "I have an open wound and I think it's infected".

## 2017-04-03 NOTE — ED Notes (Signed)
Pt discharged to home.  Family member driving.  Discharge instructions reviewed.  Verbalized understanding.  No questions or concerns at this time.  Teach back verified.  Pt in NAD.  No items left in ED.   

## 2017-04-03 NOTE — ED Notes (Signed)
Dressing applied to patient's L foot.

## 2017-04-08 ENCOUNTER — Encounter: Payer: Commercial Managed Care - PPO | Attending: Internal Medicine | Admitting: Internal Medicine

## 2017-04-08 DIAGNOSIS — E1142 Type 2 diabetes mellitus with diabetic polyneuropathy: Secondary | ICD-10-CM | POA: Diagnosis not present

## 2017-04-08 DIAGNOSIS — L97521 Non-pressure chronic ulcer of other part of left foot limited to breakdown of skin: Secondary | ICD-10-CM | POA: Diagnosis not present

## 2017-04-08 DIAGNOSIS — I10 Essential (primary) hypertension: Secondary | ICD-10-CM | POA: Diagnosis not present

## 2017-04-08 DIAGNOSIS — Z794 Long term (current) use of insulin: Secondary | ICD-10-CM | POA: Diagnosis not present

## 2017-04-08 DIAGNOSIS — E11621 Type 2 diabetes mellitus with foot ulcer: Secondary | ICD-10-CM | POA: Insufficient documentation

## 2017-04-09 NOTE — Progress Notes (Signed)
[name] ([id number]) Visit Report for 04/08/2017 Chief Complaint Document Details Patient Name: [name], [name]. Date of Service: 04/08/2017 10:30 AM Medical Record Patient Account Number: [id number] Number: Treating RN: Ahmed Prima August 19, 1960 (56 y.o. Other Clinician: Date of Birth/Sex: Male) Treating Kaybree Williams Primary Care Provider: Lorelee Market Provider/Extender: G Referring Provider: Francee Piccolo Weeks in Treatment: 0 Information Obtained from: Patient Chief Complaint 03/06/16; patient returns today after further surgery on the left foot as well as a 6 week course of IV antibiotics 04/08/17; patient returns today with a 3 week history of an opening on the lateral left foot in the same area as previously Electronic Signature(s) Signed: 04/08/2017 4:33:30 PM By: [name] MD Entered By: [name] on 04/08/2017 11:23:26 [name] ([id number]) -------------------------------------------------------------------------------- Debridement Details Patient Name: [name]. Date of Service: 04/08/2017 10:30 AM Medical Record Patient Account Number: [id number] Number: Treating RN: Ahmed Prima Feb 28, 1961 (56 y.o. Other Clinician: Date of Birth/Sex: Male) Treating Takyla Kuchera Primary Care Provider: Lorelee Market Provider/Extender: G Referring Provider: Mcarthur Rossetti in Treatment: 0 Debridement Performed for Wound #9 Left,Lateral Foot Assessment: Performed By: Physician Ricard Dillon, MD Debridement: Debridement Severity of Tissue Pre Fat layer exposed Debridement: Pre-procedure Verification/Time Out Yes - 11:01 Taken: Start Time: 11:02 Pain Control: Lidocaine 4% Topical Solution Level: Skin/Subcutaneous Tissue Total Area Debrided (L x 2 (cm) x 0.4 (cm) = 0.8 (cm) W): Tissue and other Viable, Non-Viable, Exudate, Fibrin/Slough, Subcutaneous material debrided: Instrument:  Curette Bleeding: Minimum Hemostasis Achieved: Pressure End Time: 11:04 Procedural Pain: 0 Post Procedural Pain: 0 Response to Treatment: Procedure was tolerated well Post Debridement Measurements of Total Wound Length: (cm) 2 Width: (cm) 0.5 Depth: (cm) 0.2 Volume: (cm) 0.157 Character of Wound/Ulcer Post Requires Further Debridement Debridement: Severity of Tissue Post Debridement: Fat layer exposed Post Procedure Diagnosis Same as Pre-procedure Electronic Signature(s) JANSEL, VONSTEIN ([id number]) Signed: 04/08/2017 4:19:28 PM By: Alric Quan Signed: 04/08/2017 4:33:30 PM By: [name] MD Entered By: [name] on 04/08/2017 11:22:01 [name] ([id number]) -------------------------------------------------------------------------------- HPI Details Patient Name: [name]. Date of Service: 04/08/2017 10:30 AM Medical Record Patient Account Number: [id number] Number: Treating RN: Ahmed Prima January 26, 1961 (56 y.o. Other Clinician: Date of Birth/Sex: Male) Treating Elva Breaker Primary Care Provider: Lorelee Market Provider/Extender: G Referring Provider: Mcarthur Rossetti in Treatment: 0 History of Present Illness HPI Description: Pleasant 56 year old with history of diabetes (Hgb A1c 10.8 in 2014) and peripheral neuropathy. No PVD. L ABI 1.1. Status post right great toe partial amputation years ago. He was at work and on 10/22/2014, was injured by a cart, and suffered an ulceration to his left anterior calf. He says that it subsequently became infected, and he was treated with a course of antibiotics. He was found on initial exam to have an ulceration on the dorsum of his left third toe. He was unaware of this and attributes it to pressure from his steel toed boots. More recently he injured his right anterior calf on a cart. Ambulating normally per his baseline. He has been undergoing regular debridements, applying mupirocin  cream, and an Ace wrap for edema control. He returns to clinic for follow-up and is without complaints. No pain. No fever or chills. No drainage. 10/25/15; this is a 56 year old man who has type II diabetes with diabetic polyneuropathy. He tells me that he fractured his left fifth metatarsal in June 2016 when he presented with swelling. He does not recall a specific injury. His hemoglobin  A1c was apparently too high at the time for consideration of surgery and he was put in some form of offloading. Ultimately he went to surgery in December with an allograft from his calcaneus to this site, plate and screws. He had an x-ray of the foot in March that showed concerns about nonunion. He tells me that in March he had to move and basically moved himself. He was on his foot a lot and then noticed some drainage from an open area. He has been following with his orthopedic surgeon Dr. Doran Durand. He has been applying a felt donut, dry dressing and using his heel healing sandal. 11/01/15; this is a patient I saw last week for the first time. He had a small open wound on the plantar aspect of his left foot at roughly the level of the base of his fifth metatarsal. He had a considerable degree of thickened skin around this wound on the plantar aspect which I thought was from chronic pressure on this area. He tells [place] that he had drainage over the course of the week. No systemic symptoms. 11/08/15; culture last week grew Citrobacter korseri. This should've been sensitive to the Augmentin I gave him. He has seen Dr. Doran Durand who did his initial surgery and according the patient the plan is to give this another month and then the hardware might need to come out of this. This seems like a reasonable plan. I will adjust his antibiotics to ciprofloxacin which probably should continue for at least another 2 weeks. I gave him 10 days worth today 11/22/15 the patient has completed antibiotics. He has an appointment with Dr.  Doran Durand this Friday. There is improved dimensions around the wound on the left fifth metatarsal base 11/29/15; the patient has completed antibiotics last week. Apparently his appointment with Dr. Doran Durand it is not until this Friday. Dimensions are roughly the same. 12/06/15; saw Dr. Doran Durand. No x-ray told the end of the month, next appointment June 30. We have been using Aquacel Ag 12/13/15: No major change this week. Using Aquacel AG 621/17; arrived this week with maceration around the wound. There was quite a bit of undermining which [name], [name]. ([id number]) required surgical debridement. I changed him to [facility] last week, by the patient's admission he was up on this more this week 12/27/15; macerated tissue around the wound is removed with a scalpel and pickups. There is no undermining. Nonviable subcutaneous tissue and skin taken from the superior circumference of the wound is slough from the surface. READMISSION 03/06/16 since I last saw this patient at the end of June, he went for surgery on 01/11/16 by Dr. Doran Durand of orthopedics. He had a left foot irrigation and debridement, removal of hardware and placement of wound VAC. He is also been followed by Dr. Megan Salon of infectious disease and completed a six-week course of IV Rocephin for group B strep and Enterobacter in the bone at the time of surgery. Apparently at the time of surgery the bone looked healthy so I don't think any bone was actually removed. He has been using silver alginate based dressings on the same wound area at the base of the left fifth metatarsal on the left. I note that he is also had arterial studies on 01/08/16, these showed a left ABI of 1.2 to and a right ABI of 1.3. Waveforms were listed as biphasic. He was not felt to have any specific arterial issues. 03/13/16; no real change in the condition of the wound at the  left lateral foot at roughly the base of his fifth metatarsal. Use silver alginate last  week. 03/18/16 arrives today with no open area. Being suspicious of the overlying callus I. Some of this back although I see nothing but covering tissue here/epithelium. There is no surrounding tenderness READMISSION 04/16/16 this is a patient I discharged about a month ago. Initially a surgical wound on the lateral aspect of his left foot which subsequently became infected. The story he is giving today that he went back into his own modified shoe started to notice pain 2 weeks ago he was seen in Dr. Nona Dell office by a physician assistant last Wednesday and according the patient was told that everything looked fine however this is clearly now broken open and he has an open wound in the same spot that we have been dealing with repetitively. Situation is complicated by the fact that he is running short of money on long-term disability. He has not taken his insulin and at least 2 weeks was previously on NovoLog short acting insulin on a sliding scale and TRESIBA 35 units at bedtime. He is no longer able to afford any of his medication he was in the x-ray on 10/15. A plain x-ray showed healed fracture of the left fifth metatarsal bone status post removal of the associated plate and screw fixation hardware. There was no acute appearing osseous abnormality. His blood work showed a white count of 9.2 with an essentially normal differential comprehensive metabolic panel was normal. Previous CT scan of the foot on 01/08/16 showed no osteomyelitis previous vascular workup showed no evidence of significant PAD on 01/13/16 04/23/16; culture I did last week grew enterococcus [ampicillin sensitive] and MRSA. He saw infectious disease yesterday. They stop the clindamycin and ordered an MRI. This is not unreasonable. All the hardware is out of the foot at this point. 04/30/16 at this point in time we are still awaiting the results of the MRI at this point in time. Patient did have an area which appears to be  somewhat macerated in the proximal portion of the wound where there is overlying necrotic skin that is doing nothing more than trapping fluid underneath. He continues to state that he does have some discomfort and again the concern is for the possibility of osteomyelitis hence the reason for the MRI order. We have been using a silver alginate dressing but again I think the reason this with his macerated as it was is that the dressing obscene could not reach the entirety of the wound due to some necrotic skin covering the proximal portion. No bleeding noted at this point in time on initial evaluation. 05/07/16 we receive the results of patient's MRI which shows that he has no evidence of osteomyelitis. This obviously is excellent news. Patient was definitely happy to hear this. He tells me the wound appears to be doing very well at this point in time and is pleased with the progress currently. 05/15/16; as noted the patient did not have osteomyelitis. He has been released by infectious disease and [name], [name] ([id number]) orthopedics. His wound is still open he had a debridement last week but complain when he got home he "bleeding for half a day". He is not had any pain. We have been using silver alginate with Kerlix gauze wrap. 05/28/16; patient arrives today with the wound in much the same condition as last time. He has a small opening on the lateral aspect of his foot however with debridement there is clear undermining  medially there is no real evidence of infection here and I didn't see any point in culturing this. One would have to wonder if this isn't a simple matter of in adequate offloading as he is been using a healing sandal. 06/05/16; the open area is now on the plantar aspect of his foot and not decide. The wound almost appears to have "migrated". This was the term use by our intake nurse. 06/12/16; open area on the plantar aspect of his foot. Base of the wound looks very healthy.  This will be his second week in a total contact cast 06/19/16; patient arrived today in a total contact cast. There was some expectation from our staff and myself that this area would be healed. Unfortunately the area was boggy and with rec pressure a fairly substantial amount of purulent drainage was obtained. Specimen obtained for culture. The patient had no complaints of systemic problems including fever or chills or instability of his diabetes. There was no pain in the foot. Nevertheless a extensive debridement was required. 06/26/16; patient's culture from the abscess last week grew a combination of MRSA and ampicillin sensitive enterococcus. I had him on Augmentin and Septra however I have elected to give him a full 10 day course of Zyvox instead as I Recently treated this combination of organisms with Augmentin and Septra before. Arrives today with no systemic symptoms 07/03/16; the patient has 2 more treatments of Zyvox and then he is finished antibiotics the wound has improved now mostly on the lateral aspect of his foot. There is still some tenderness when he walks. 07/10/16; patient arrives today with Zyvox completed. He only has a small open area remaining. 07/24/16; she arrives here with no overt open area. The covering is thick callus/eschar. Nevertheless there is no open area here. He has some tenderness underneath the area but no overt infection is observed no drainage. The patient has a deformity in the foot with this area weekly to be exposed to more pressure in his foot where nevertheless that something we are going to have to deal with going forward. The patient has diabetic workboots and diabetic shoes. He has had a long difficult course with the area here. This started as a fracture at work. He had bone grafting from his calcaneus and screws. This got infected he had to have more surgery on the area. Bone at the time of that surgery I think showed enterococcus and group B  strep. He had 6 weeks of Rocephin. Since then the area has waxed and waned in its difficulty. Recently he had an MRI in December that did not show osteomyelitis nevertheless he had an abscess that grew MRSA and enterococcus which I elected to treat with Zyvox. This was in December and this wound is actually "healed" over 08/21/16; the patient came in today for his one-month follow-up visit. The area on the lateral aspect of his left foot looks much the same as some month ago. There is no evidence of an open wound here. However the patient tells me about a week after he went back to work he developed severe pain and swelling in the plantar aspect of his right foot first and fifth metatarsal heads. 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 He went to see his podiatrist at [facility]. She told him that he could not work on his feet. She told him to go back and his  cam walking boot on the left. Not have open wounds obviously on the right. The patient is actually gone ahead and retired from his job because he does not feel he can work on his feet 09/18/16; patient comes back in saying he recently had some pain on the lateral aspect of his left foot. Asked that we look at this. Other than that all of his wounds have a viable surface. He has diabetic shoes. He is retired from work. READMISSION Trenten Watchman is a 56 year old man who we cared for for a prolonged period of time in late 2017 into March 2018. At that point he had suffered a fracture of his left fifth metatarsal at about the level of the base of the fifth metatarsal. He had surgical repair by Dr. Meryl Dare and he developed a very refractory wound over the [name], [name]. ([id number]) fifth metatarsal. Ultimately this became infected he required hardware removal this eventually closed over and we discharged from the clinic in March of this year. He tells me as  well until roughly 3 weeks ago when he developed increasing pain in the same area making it difficult for him to sleep. He was admitted to hospital from 9/5 through 9/7 with now an ulcer in the same area on the lateral aspect of his left foot at the level of the base of the metatarsal. X-rays and MRIs during this hospitalization were negative. Wound culture showed a combination of Escherichia coli, Morganella and Pseudomonas. He was given IV Cipro and vancomycin the hospital and then discharged home on Cipro and Doxy. He returned to the ER on 9/16 with leg swelling and discomfort. I don't think anything was added at that point although he did have an ultrasound of the left leg that was negative for DVT. He was back in the ER on 10/4 again with pain in the area he was given Bactrim but states he had a significant allergic reaction to that which has abated once he stopped the Bactrim. I don't think he had a full course. He went to see his podiatrist yesterday who recommended some form of foot soak. He has a Darco forefoot offloading boot The patient is a type II diabetic on insulin. Tells me his recent hemoglobin A1c was 7. Arterial studies done in July 2017 showed an ABI in the right of 1.3 on the left 1.2 to biphasic waveforms bilaterally. He was not felt to have arterial disease. As noted the patient has had 2 MRIs most recently on 10/4 this showed no evidence of an abscess or osteomyelitis and no change since the prior study of 03/05/17 there was nonspecific edema on the dorsum of the foot. ABIs in this clinic today 1.2 which would be unchanged from his study done in [place] Signature(s) Signed: 04/08/2017 4:33:30 PM By: [name] MD Entered By: [name] on 04/08/2017 11:32:53 [name] ([id number]) -------------------------------------------------------------------------------- Physical Exam Details Patient Name: CONNY, MOENING. Date of Service: 04/08/2017 10:30  AM Medical Record Patient Account Number: [id number] Number: Treating RN: Ahmed Prima May 14, 1961 (56 y.o. Other Clinician: Date of Birth/Sex: Male) Treating Keishla Oyer Primary Care Provider: Lorelee Market Provider/Extender: G Referring Provider: Francee Piccolo Weeks in Treatment: 0 Constitutional Sitting or standing Blood Pressure is within target range for patient.. Pulse regular and within target range for patient.Marland Kitchen Respirations regular, non-labored and within target range.. Temperature is normal and within the target range for the patient.Marland Kitchen appears in no distress. Eyes Conjunctivae clear. No discharge. Respiratory Respiratory effort  is easy and symmetric bilaterally. Rate is normal at rest and on room air.. Bilateral breath sounds are clear and equal in all lobes with no wheezes, rales or rhonchi.. Cardiovascular Heart rhythm and rate regular, without murmur or gallop.. Pedal pulses palpable and strong bilaterally.. Notes Wound exam; the patient has 2 small open areas on the lateral aspect of his left foot at about the base of his left fifth metatarsal. This is in the same area his is problematic wounds were earlier in the year. However these are small. The inferior one really look quite healthy superior one debrided of thick circumferential skin and subcutaneous tissue. Hemostasis with silver nitrate. I see no evidence of infection here there is no soft tissue swelling or tenderness. Electronic Signature(s) Signed: 04/08/2017 4:33:30 PM By: [name] MD Entered By: [name] on 04/08/2017 11:34:59 [name] ([id number]) -------------------------------------------------------------------------------- Physician Orders Details Patient Name: ISABEL, FREESE. Date of Service: 04/08/2017 10:30 AM Medical Record Patient Account Number: [id number] Number: Treating RN: Ahmed Prima 04-01-1961 (56 y.o. Other Clinician: Date of  Birth/Sex: Male) Treating Ammon Muscatello Primary Care Provider: Lorelee Market Provider/Extender: G Referring Provider: Mcarthur Rossetti in Treatment: 0 Verbal / Phone Orders: Yes Clinician: Pinkerton, Debi Read Back and Verified: Yes Diagnosis Coding Wound Cleansing Wound #9 Left,Lateral Foot o Clean wound with wound cleanser. o Cleanse wound with mild soap and water Anesthetic Wound #9 Left,Lateral Foot o Topical Lidocaine 4% cream applied to wound bed prior to debridement Primary Wound Dressing Wound #9 Left,Lateral Foot o Hydrogel o Prisma Ag Secondary Dressing Wound #9 Left,Lateral Foot o Dry Gauze o Conform/Kerlix o Coban Dressing Change Frequency Wound #9 Left,Lateral Foot o Change dressing every week Follow-up Appointments Wound #9 Left,Lateral Foot o Return Appointment in 1 week. Edema Control Wound #9 Left,Lateral Foot o Kerlix and Coban - Left Lower Extremity - unna to anchor o Elevate legs to the level of the heart and pump ankles as often as possible Trabert, Naszir F. ([id number]) Additional Orders / Instructions Wound #9 Left,Lateral Foot o Increase protein intake. Electronic Signature(s) Signed: 04/08/2017 4:19:28 PM By: Alric Quan Signed: 04/08/2017 4:33:30 PM By: [name] MD Entered By: Alric Quan on 04/08/2017 11:34:29 [name] ([id number]) -------------------------------------------------------------------------------- Problem List Details Patient Name: MAXWEL, MEADOWCROFT. Date of Service: 04/08/2017 10:30 AM Medical Record Patient Account Number: [id number] Number: Treating RN: Ahmed Prima 1960-07-28 (56 y.o. Other Clinician: Date of Birth/Sex: Male) Treating Shawndrea Rutkowski Primary Care Provider: Lorelee Market Provider/Extender: G Referring Provider: Francee Piccolo Weeks in Treatment: 0 Active Problems ICD-10 Encounter Code Description Active Date Diagnosis E11.621  Type 2 diabetes mellitus with foot ulcer 04/08/2017 Yes L97.521 Non-pressure chronic ulcer of other part of left foot limited 04/08/2017 Yes to breakdown of skin Inactive Problems Resolved Problems Electronic Signature(s) Signed: 04/08/2017 4:33:30 PM By: [name] MD Entered By: [name] on 04/08/2017 11:19:31 [name] ([id number]) -------------------------------------------------------------------------------- Progress Note/History and Physical Details Patient Name: [name]. Date of Service: 04/08/2017 10:30 AM Medical Record Patient Account Number: [id number] Number: Treating RN: Ahmed Prima [birth year] (56 y.o. Other Clinician: Date of Birth/Sex: Male) Treating Jaqualyn Juday Primary Care Provider: Lorelee Market Provider/Extender: G Referring Provider: Francee Piccolo Weeks in Treatment: 0 Subjective Chief Complaint Information obtained from Patient 03/06/16; patient returns today after further surgery on the left foot as well as a 6 week course of IV antibiotics 04/08/17; patient returns today with a 3 week history of an opening on the lateral left foot  in the same area as previously History of Present Illness (HPI) Pleasant 56 year old with history of diabetes (Hgb A1c 10.8 in 2014) and peripheral neuropathy. No PVD. L ABI 1.1. Status post right great toe partial amputation years ago. He was at work and on 10/22/2014, was injured by a cart, and suffered an ulceration to his left anterior calf. He says that it subsequently became infected, and he was treated with a course of antibiotics. He was found on initial exam to have an ulceration on the dorsum of his left third toe. He was unaware of this and attributes it to pressure from his steel toed boots. More recently he injured his right anterior calf on a cart. Ambulating normally per his baseline. He has been undergoing regular debridements, applying mupirocin cream, and an Ace wrap for  edema control. He returns to clinic for follow-up and is without complaints. No pain. No fever or chills. No drainage. 10/25/15; this is a 56 year old man who has type II diabetes with diabetic polyneuropathy. He tells me that he fractured his left fifth metatarsal in June 2016 when he presented with swelling. He does not recall a specific injury. His hemoglobin A1c was apparently too high at the time for consideration of surgery and he was put in some form of offloading. Ultimately he went to surgery in December with an allograft from his calcaneus to this site, plate and screws. He had an x-ray of the foot in March that showed concerns about nonunion. He tells me that in March he had to move and basically moved himself. He was on his foot a lot and then noticed some drainage from an open area. He has been following with his orthopedic surgeon Dr. Doran Durand. He has been applying a felt donut, dry dressing and using his heel healing sandal. 11/01/15; this is a patient I saw last week for the first time. He had a small open wound on the plantar aspect of his left foot at roughly the level of the base of his fifth metatarsal. He had a considerable degree of thickened skin around this wound on the plantar aspect which I thought was from chronic pressure on this area. He tells [place] that he had drainage over the course of the week. No systemic symptoms. 11/08/15; culture last week grew Citrobacter korseri. This should've been sensitive to the Augmentin I gave him. He has seen Dr. Doran Durand who did his initial surgery and according the patient the plan is to give this another month and then the hardware might need to come out of this. This seems like a reasonable plan. I will adjust his antibiotics to ciprofloxacin which probably should continue for at least another 2 weeks. I gave him 10 days worth today [name], [name] ([id number]) 11/22/15 the patient has completed antibiotics. He has an appointment with Dr.  Doran Durand this Friday. There is improved dimensions around the wound on the left fifth metatarsal base 11/29/15; the patient has completed antibiotics last week. Apparently his appointment with Dr. Doran Durand it is not until this Friday. Dimensions are roughly the same. 12/06/15; saw Dr. Doran Durand. No x-ray told the end of the month, next appointment June 30. We have been using Aquacel Ag 12/13/15: No major change this week. Using Aquacel AG 621/17; arrived this week with maceration around the wound. There was quite a bit of undermining which required surgical debridement. I changed him to [facility] last week, by the patient's admission he was up on this more this week 12/27/15; macerated  tissue around the wound is removed with a scalpel and pickups. There is no undermining. Nonviable subcutaneous tissue and skin taken from the superior circumference of the wound is slough from the surface. READMISSION 03/06/16 since I last saw this patient at the end of June, he went for surgery on 01/11/16 by Dr. Doran Durand of orthopedics. He had a left foot irrigation and debridement, removal of hardware and placement of wound VAC. He is also been followed by Dr. Megan Salon of infectious disease and completed a six-week course of IV Rocephin for group B strep and Enterobacter in the bone at the time of surgery. Apparently at the time of surgery the bone looked healthy so I don't think any bone was actually removed. He has been using silver alginate based dressings on the same wound area at the base of the left fifth metatarsal on the left. I note that he is also had arterial studies on 01/08/16, these showed a left ABI of 1.2 to and a right ABI of 1.3. Waveforms were listed as biphasic. He was not felt to have any specific arterial issues. 03/13/16; no real change in the condition of the wound at the left lateral foot at roughly the base of his fifth metatarsal. Use silver alginate last week. 03/18/16 arrives today with no open  area. Being suspicious of the overlying callus I. Some of this back although I see nothing but covering tissue here/epithelium. There is no surrounding tenderness READMISSION 04/16/16 this is a patient I discharged about a month ago. Initially a surgical wound on the lateral aspect of his left foot which subsequently became infected. The story he is giving today that he went back into his own modified shoe started to notice pain 2 weeks ago he was seen in Dr. Nona Dell office by a physician assistant last Wednesday and according the patient was told that everything looked fine however this is clearly now broken open and he has an open wound in the same spot that we have been dealing with repetitively. Situation is complicated by the fact that he is running short of money on long-term disability. He has not taken his insulin and at least 2 weeks was previously on NovoLog short acting insulin on a sliding scale and TRESIBA 35 units at bedtime. He is no longer able to afford any of his medication he was in the x-ray on 10/15. A plain x-ray showed healed fracture of the left fifth metatarsal bone status post removal of the associated plate and screw fixation hardware. There was no acute appearing osseous abnormality. His blood work showed a white count of 9.2 with an essentially normal differential comprehensive metabolic panel was normal. Previous CT scan of the foot on 01/08/16 showed no osteomyelitis previous vascular workup showed no evidence of significant PAD on 01/13/16 04/23/16; culture I did last week grew enterococcus [ampicillin sensitive] and MRSA. He saw infectious disease yesterday. They stop the clindamycin and ordered an MRI. This is not unreasonable. All the hardware is out of the foot at this point. 04/30/16 at this point in time we are still awaiting the results of the MRI at this point in time. Patient did have an area which appears to be somewhat macerated in the proximal portion of  the wound where there is overlying necrotic skin that is doing nothing more than trapping fluid underneath. He continues to state that [name], [name]. ([id number]) he does have some discomfort and again the concern is for the possibility of osteomyelitis hence the reason  for the MRI order. We have been using a silver alginate dressing but again I think the reason this with his macerated as it was is that the dressing obscene could not reach the entirety of the wound due to some necrotic skin covering the proximal portion. No bleeding noted at this point in time on initial evaluation. 05/07/16 we receive the results of patient's MRI which shows that he has no evidence of osteomyelitis. This obviously is excellent news. Patient was definitely happy to hear this. He tells me the wound appears to be doing very well at this point in time and is pleased with the progress currently. 05/15/16; as noted the patient did not have osteomyelitis. He has been released by infectious disease and orthopedics. His wound is still open he had a debridement last week but complain when he got home he "bleeding for half a day". He is not had any pain. We have been using silver alginate with Kerlix gauze wrap. 05/28/16; patient arrives today with the wound in much the same condition as last time. He has a small opening on the lateral aspect of his foot however with debridement there is clear undermining medially there is no real evidence of infection here and I didn't see any point in culturing this. One would have to wonder if this isn't a simple matter of in adequate offloading as he is been using a healing sandal. 06/05/16; the open area is now on the plantar aspect of his foot and not decide. The wound almost appears to have "migrated". This was the term use by our intake nurse. 06/12/16; open area on the plantar aspect of his foot. Base of the wound looks very healthy. This will be his second week in a total contact  cast 06/19/16; patient arrived today in a total contact cast. There was some expectation from our staff and myself that this area would be healed. Unfortunately the area was boggy and with rec pressure a fairly substantial amount of purulent drainage was obtained. Specimen obtained for culture. The patient had no complaints of systemic problems including fever or chills or instability of his diabetes. There was no pain in the foot. Nevertheless a extensive debridement was required. 06/26/16; patient's culture from the abscess last week grew a combination of MRSA and ampicillin sensitive enterococcus. I had him on Augmentin and Septra however I have elected to give him a full 10 day course of Zyvox instead as I Recently treated this combination of organisms with Augmentin and Septra before. Arrives today with no systemic symptoms 07/03/16; the patient has 2 more treatments of Zyvox and then he is finished antibiotics the wound has improved now mostly on the lateral aspect of his foot. There is still some tenderness when he walks. 07/10/16; patient arrives today with Zyvox completed. He only has a small open area remaining. 07/24/16; she arrives here with no overt open area. The covering is thick callus/eschar. Nevertheless there is no open area here. He has some tenderness underneath the area but no overt infection is observed no drainage. The patient has a deformity in the foot with this area weekly to be exposed to more pressure in his foot where nevertheless that something we are going to have to deal with going forward. The patient has diabetic workboots and diabetic shoes. He has had a long difficult course with the area here. This started as a fracture at work. He had bone grafting from his calcaneus and screws. This got infected he had to  have more surgery on the area. Bone at the time of that surgery I think showed enterococcus and group B strep. He had 6 weeks of Rocephin. Since then the area  has waxed and waned in its difficulty. Recently he had an MRI in December that did not show osteomyelitis nevertheless he had an abscess that grew MRSA and enterococcus which I elected to treat with Zyvox. This was in December and this wound is actually "healed" over 08/21/16; the patient came in today for his one-month follow-up visit. The area on the lateral aspect of his left foot looks much the same as some month ago. There is no evidence of an open wound here. However the patient tells me about a week after he went back to work he developed severe pain and swelling in the plantar aspect of his right foot first and fifth metatarsal heads. 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 He went to see his podiatrist at [facility] She told him that he could not work on his feet. She told him to go back and his cam walking boot on the left. Not have open wounds obviously on the right. The patient is actually gone ahead and retired from his job because he does not feel he can work on his [place]. ([id number]) 09/18/16; patient comes back in saying he recently had some pain on the lateral aspect of his left foot. Asked that we look at this. Other than that all of his wounds have a viable surface. He has diabetic shoes. He is retired from work. READMISSION Gomer France is a 56 year old man who we cared for for a prolonged period of time in late 2017 into March 2018. At that point he had suffered a fracture of his left fifth metatarsal at about the level of the base of the fifth metatarsal. He had surgical repair by Dr. Meryl Dare and he developed a very refractory wound over the fifth metatarsal. Ultimately this became infected he required hardware removal this eventually closed over and we discharged from the clinic in March of this year. He tells me as well until roughly 3 weeks ago when he developed  increasing pain in the same area making it difficult for him to sleep. He was admitted to hospital from 9/5 through 9/7 with now an ulcer in the same area on the lateral aspect of his left foot at the level of the base of the metatarsal. X-rays and MRIs during this hospitalization were negative. Wound culture showed a combination of Escherichia coli, Morganella and Pseudomonas. He was given IV Cipro and vancomycin the hospital and then discharged home on Cipro and Doxy. He returned to the ER on 9/16 with leg swelling and discomfort. I don't think anything was added at that point although he did have an ultrasound of the left leg that was negative for DVT. He was back in the ER on 10/4 again with pain in the area he was given Bactrim but states he had a significant allergic reaction to that which has abated once he stopped the Bactrim. I don't think he had a full course. He went to see his podiatrist yesterday who recommended some form of foot soak. He has a Darco forefoot offloading boot The patient is a type II diabetic on insulin. Tells me his recent hemoglobin A1c was 7. Arterial studies done in July 2017 showed an ABI in the  right of 1.3 on the left 1.2 to biphasic waveforms bilaterally. He was not felt to have arterial disease. As noted the patient has had 2 MRIs most recently on 10/4 this showed no evidence of an abscess or osteomyelitis and no change since the prior study of 03/05/17 there was nonspecific edema on the dorsum of the foot. ABIs in this clinic today 1.2 which would be unchanged from his study done in July/17 Wound History Patient presents with 1 open wound that has been present for approximately 3 weeks. Patient has been treating wound in the following manner: wet to dry. Laboratory tests have not been performed in the last month. Patient reportedly has tested positive for an antibiotic resistant organism. Patient reportedly has tested positive for osteomyelitis. Patient  reportedly has had testing performed to evaluate circulation in the legs. Patient experiences the following problems associated with their wounds: swelling. Patient History Information obtained from Patient. Allergies Bactrim Family History Diabetes - Mother,Father, Heart Disease - Father,Mother, Hypertension - Mother,Father, Stroke - Father, No family history of Cancer, Hereditary Spherocytosis, Kidney Disease, Lung Disease, Seizures, Thyroid Problems, Tuberculosis. Social History Never smoker, Marital Status - Married, Alcohol Use - Never, Drug Use - No History, Caffeine Use - Daily. [name], [name] ([id number]) Medical History Eyes Denies history of Cataracts, Glaucoma, Optic Neuritis Ear/Nose/Mouth/Throat Denies history of Chronic sinus problems/congestion, Middle ear problems Hematologic/Lymphatic Denies history of Anemia, Hemophilia, Human Immunodeficiency Virus, Lymphedema, Sickle Cell Disease Respiratory Denies history of Aspiration, Asthma, Chronic Obstructive Pulmonary Disease (COPD), Pneumothorax, Sleep Apnea, Tuberculosis Cardiovascular Patient has history of Hypertension Denies history of Angina, Arrhythmia, Congestive Heart Failure, Coronary Artery Disease, Deep Vein Thrombosis, Hypotension, Myocardial Infarction, Peripheral Arterial Disease, Peripheral Venous Disease, Phlebitis, Vasculitis Gastrointestinal Denies history of Cirrhosis , Colitis, Crohn s, Hepatitis A, Hepatitis B, Hepatitis C Endocrine Patient has history of Type II Diabetes Denies history of Type I Diabetes Genitourinary Denies history of End Stage Renal Disease Immunological Denies history of Lupus Erythematosus, Raynaud s, Scleroderma Integumentary (Skin) Denies history of History of Burn, History of pressure wounds Musculoskeletal Denies history of Gout, Rheumatoid Arthritis, Osteoarthritis, Osteomyelitis Neurologic Patient has history of Neuropathy Denies history of Dementia, Quadriplegia,  Paraplegia, Seizure Disorder Oncologic Denies history of Received Chemotherapy, Received Radiation Psychiatric Denies history of Anorexia/bulimia, Confinement Anxiety Patient is treated with Insulin. Blood sugar is tested. Review of Systems (ROS) Constitutional Symptoms (General Health) The patient has no complaints or symptoms. Eyes Complains or has symptoms of Glasses / Contacts. Ear/Nose/Mouth/Throat The patient has no complaints or symptoms. Hematologic/Lymphatic The patient has no complaints or symptoms. Respiratory The patient has no complaints or symptoms. Gastrointestinal [name], [name] ([id number]) The patient has no complaints or symptoms. Genitourinary The patient has no complaints or symptoms. Immunological The patient has no complaints or symptoms. Integumentary (Skin) Complains or has symptoms of Wounds. Musculoskeletal The patient has no complaints or symptoms. Oncologic The patient has no complaints or symptoms. Psychiatric The patient has no complaints or symptoms. Objective Constitutional Sitting or standing Blood Pressure is within target range for patient.. Pulse regular and within target range for patient.Marland Kitchen Respirations regular, non-labored and within target range.. Temperature is normal and within the target range for the patient.Marland Kitchen appears in no distress. Vitals Time Taken: 10:38 AM, Height: 70 in, Source: Stated, Weight: 272 lbs, Source: Measured, BMI: 39, Temperature: 98.0 F, Pulse: 96 bpm, Respiratory Rate: 18 breaths/min, Blood Pressure: 142/82 mmHg. Eyes Conjunctivae clear. No discharge. Respiratory Respiratory effort is easy and symmetric bilaterally. Rate is normal at  rest and on room air.. Bilateral breath sounds are clear and equal in all lobes with no wheezes, rales or rhonchi.. Cardiovascular Heart rhythm and rate regular, without murmur or gallop.. Pedal pulses palpable and strong bilaterally.. General Notes: Wound exam; the patient  has 2 small open areas on the lateral aspect of his left foot at about the base of his left fifth metatarsal. This is in the same area his is problematic wounds were earlier in the year. However these are small. The inferior one really look quite healthy superior one debrided of thick circumferential skin and subcutaneous tissue. Hemostasis with silver nitrate. I see no evidence of infection here there is no soft tissue swelling or tenderness. [name], [name] ([id number]) Integumentary (Hair, Skin) Wound #9 status is Open. Original cause of wound was Gradually Appeared. The wound is located on the Left,Lateral Foot. The wound measures 2cm length x 0.4cm width x 0.2cm depth; 0.628cm^2 area and 0.126cm^3 volume. There is no tunneling or undermining noted. There is a large amount of serous drainage noted. The wound margin is distinct with the outline attached to the wound base. There is medium (34-66%) red granulation within the wound bed. There is a medium (34-66%) amount of necrotic tissue within the wound bed including Eschar and Adherent Slough. Periwound temperature was noted as No Abnormality. The periwound has tenderness on palpation. 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 There was a Skin/Subcutaneous Tissue Debridement ([id number]) debridement with total area of 0.8 sq cm performed by Ricard Dillon, MD. with the following instrument(s): Curette to remove Viable and Non-Viable tissue/material including Exudate, Fibrin/Slough, and Subcutaneous after achieving pain control using Lidocaine 4% Topical Solution. A time out was conducted at 11:01, prior to  the start of the procedure. A Minimum amount of bleeding was controlled with Pressure. The procedure was tolerated well with a pain level of 0 throughout and a pain level of 0 following the procedure. Post Debridement Measurements: 2cm length x 0.5cm width x 0.2cm depth; 0.157cm^3 volume. Character of Wound/Ulcer Post Debridement requires further debridement. Severity of Tissue Post Debridement is: Fat layer exposed. Post procedure Diagnosis Wound #9: Same as Pre-Procedure Plan Wound Cleansing: JASIEL, BELISLE. ([id number]) Wound #9 Left,Lateral Foot: Clean wound with wound cleanser. Cleanse wound with mild soap and water Anesthetic: Wound #9 Left,Lateral Foot: Topical Lidocaine 4% cream applied to wound bed prior to debridement Primary Wound Dressing: Wound #9 Left,Lateral Foot: Hydrogel Prisma Ag Secondary Dressing: Wound #9 Left,Lateral Foot: Dry Gauze Conform/Kerlix Coban Dressing Change Frequency: Wound #9 Left,Lateral Foot: Change dressing every week Follow-up Appointments: Wound #9 Left,Lateral Foot: Return Appointment in 1 week. Edema Control: Wound #9 Left,Lateral Foot: Kerlix and Coban - Left Lower Extremity - unna to anchor Elevate legs to the level of the heart and pump ankles as often as possible Additional Orders / Instructions: Wound #9 Left,Lateral Foot: Increase protein intake. #1 the patient's wounds looked surprisingly good considering the significant infection he was admitted the hospital for a month ago. There was no undermining of the wounds. Base of the wounds looked reasonably stable. Rolled edges around required debridement. I did not feel the need to give him any further antibiotics. #2 allergic reaction  to Bactrim recently again I did not feel the need to give him further antibiotics #3 he has a Darco forefoot offload her I don't think this is really on necessary in this patient whose wound is on the lateral part of his left foot. We gave him a  healing sandal #4 I would like to keep the dressing in place rather than have him changing this. Electronic Signature(s) Signed: 04/08/2017 4:33:30 PM By: [name] MD [name] ([id number]) Entered By: [name] on 04/08/2017 11:36:39 [name] ([id number]) -------------------------------------------------------------------------------- ROS/PFSH Details Patient Name: [name], [name]. Date of Service: 04/08/2017 10:30 AM Medical Record Patient Account Number: [id number] Number: Treating RN: Ahmed Prima 01-16-1961 (56 y.o. Other Clinician: Date of Birth/Sex: Male) Treating Jerrard Bradburn Primary Care Provider: Lorelee Market Provider/Extender: G Referring Provider: Mcarthur Rossetti in Treatment: 0 Label Progress Note Print Version as History and Physical for this encounter Information Obtained From Patient Wound History Do you currently have one or more open woundso Yes How many open wounds do you currently haveo 1 Approximately how long have you had your woundso 3 weeks How have you been treating your wound(s) until nowo wet to dry Has your wound(s) ever healed and then re-openedo No Have you had any lab work done in the past montho No Have you tested positive for an antibiotic resistant organism (MRSA, VRE)o Yes Have you tested positive for osteomyelitis (bone infection)o Yes Have you had any tests for circulation on your legso Yes Where was the test doneo [facility] Have you had other problems associated with your woundso Swelling Eyes Complaints and Symptoms: Positive for: Glasses / Contacts Medical History: Negative for: Cataracts; Glaucoma; Optic Neuritis Integumentary (Skin) Complaints and Symptoms: Positive for: Wounds Medical History: Negative for: History of Burn; History of pressure wounds Constitutional Symptoms (General Health) Complaints and Symptoms: No Complaints or Symptoms Ear/Nose/Mouth/Throat [name], SPELLMAN  ([id number]) Complaints and Symptoms: No Complaints or Symptoms Medical History: Negative for: Chronic sinus problems/congestion; Middle ear problems Hematologic/Lymphatic Complaints and Symptoms: No Complaints or Symptoms Medical History: Negative for: Anemia; Hemophilia; Human Immunodeficiency Virus; Lymphedema; Sickle Cell Disease Respiratory Complaints and Symptoms: No Complaints or Symptoms Medical History: Negative for: Aspiration; Asthma; Chronic Obstructive Pulmonary Disease (COPD); Pneumothorax; Sleep Apnea; Tuberculosis Cardiovascular Medical History: Positive for: Hypertension Negative for: Angina; Arrhythmia; Congestive Heart Failure; Coronary Artery Disease; Deep Vein Thrombosis; Hypotension; Myocardial Infarction; Peripheral Arterial Disease; Peripheral Venous Disease; Phlebitis; Vasculitis Gastrointestinal Complaints and Symptoms: No Complaints or Symptoms Medical History: Negative for: Cirrhosis ; Colitis; Crohnos; Hepatitis A; Hepatitis B; Hepatitis C Endocrine Medical History: Positive for: Type II Diabetes Negative for: Type I Diabetes Time with diabetes: 20 yrs Treated with: Insulin Blood sugar tested every day: Yes Tested : 3 times weekly Genitourinary SINCERE, LIUZZI ([id number]) Complaints and Symptoms: No Complaints or Symptoms Medical History: Negative for: End Stage Renal Disease Immunological Complaints and Symptoms: No Complaints or Symptoms Medical History: Negative for: Lupus Erythematosus; Raynaudos; Scleroderma Musculoskeletal Complaints and Symptoms: No Complaints or Symptoms Medical History: Negative for: Gout; Rheumatoid Arthritis; Osteoarthritis; Osteomyelitis Neurologic Medical History: Positive for: Neuropathy Negative for: Dementia; Quadriplegia; Paraplegia; Seizure Disorder Oncologic Complaints and Symptoms: No Complaints or Symptoms Medical History: Negative for: Received Chemotherapy; Received  Radiation Psychiatric Complaints and Symptoms: No Complaints or Symptoms Medical History: Negative for: Anorexia/bulimia; Confinement Anxiety Immunizations Pneumococcal Vaccine: Received Pneumococcal Vaccination: No Implantable Devices Family and Social History YUL, DIANA ([id number]) Cancer: No; Diabetes: Yes - Mother,Father; Heart Disease: Yes - Father,Mother; Hereditary Spherocytosis:  No; Hypertension: Yes - Mother,Father; Kidney Disease: No; Lung Disease: No; Seizures: No; Stroke: Yes - Father; Thyroid Problems: No; Tuberculosis: No; Never smoker; Marital Status - Married; Alcohol Use: Never; Drug Use: No History; Caffeine Use: Daily; Financial Concerns: No; Food, Clothing or Shelter Needs: No; Support System Lacking: No; Transportation Concerns: No; Advanced Directives: No; Patient does not want information on Advanced Directives; Do not resuscitate: No; Living Will: No; Medical Power of Attorney: No Electronic Signature(s) Signed: 04/08/2017 4:19:28 PM By: Alric Quan Signed: 04/08/2017 4:33:30 PM By: [name] MD Entered By: Alric Quan on 04/08/2017 10:44:14 [name] ([id number]) -------------------------------------------------------------------------------- [facility] Details Patient Name: CYLER, KAPPES. Date of Service: 04/08/2017 Medical Record Patient Account Number: [id number] Number: Treating RN: Ahmed Prima 1960-08-28 (56 y.o. Other Clinician: Date of Birth/Sex: Male) Treating Tirza Senteno Primary Care Provider: Lorelee Market Provider/Extender: G Referring Provider: Francee Piccolo Weeks in Treatment: 0 Diagnosis Coding ICD-10 Codes Code Description E11.621 Type 2 diabetes mellitus with foot ulcer L97.521 Non-pressure chronic ulcer of other part of left foot limited to breakdown of skin Facility Procedures CPT4: Description Modifier Quantity Code 86578469 99213 - WOUND CARE VISIT-LEV 3 EST PT 1 CPT4: 62952841  11042 - DEB SUBQ TISSUE 20 SQ CM/< 1 ICD-10 Description Diagnosis L97.521 Non-pressure chronic ulcer of other part of left foot limited to breakdown of skin Physician Procedures CPT4: Description Modifier Quantity Code 3244010 99213 - WC PHYS LEVEL 3 - EST PT 25 1 ICD-10 Description Diagnosis L97.521 Non-pressure chronic ulcer of other part of left foot limited to breakdown of skin E11.621 Type 2 diabetes mellitus with foot  ulcer CPT4: 2725366 11042 - WC PHYS SUBQ TISS 20 SQ CM 1 ICD-10 Description Diagnosis L97.521 Non-pressure chronic ulcer of other part of left foot limited to breakdown of skin Electronic Signature(s) LIONAL, ICENOGLE ([id number]) Signed: 04/08/2017 4:19:28 PM By: Alric Quan Signed: 04/08/2017 4:33:30 PM By: [name] MD Entered By: Alric Quan on 04/08/2017 13:06:55

## 2017-04-09 NOTE — Progress Notes (Signed)
[name], [name] ([id number]) Visit Report for 04/08/2017 Abuse/Suicide Risk Screen Details Patient Name: [name], [name]. Date of Service: 04/08/2017 10:30 AM Medical Record Patient Account Number: [id number] Number: Treating RN: Ahmed Prima 04-11-1961 (56 y.o. Other Clinician: Date of Birth/Sex: Male) Treating ROBSON, MICHAEL Primary Care Shontae Rosiles: Lorelee Market Harvest Stanco/Extender: G Referring Taevion Sikora: Francee Piccolo Weeks in Treatment: 0 Abuse/Suicide Risk Screen Items Answer ABUSE/SUICIDE RISK SCREEN: Has anyone close to you tried to hurt or harm you recentlyo No Do you feel uncomfortable with anyone in your familyo No Has anyone forced you do things that you didnot want to doo No Do you have any thoughts of harming yourselfo No Patient displays signs or symptoms of abuse and/or neglect. No Electronic Signature(s) Signed: 04/08/2017 4:19:28 PM By: Alric Quan Entered By: Alric Quan on 04/08/2017 10:44:23 [name] ([id number]) -------------------------------------------------------------------------------- Activities of Daily Living Details Patient Name: [name], [name]. Date of Service: 04/08/2017 10:30 AM Medical Record Patient Account Number: [id number] Number: Treating RN: Ahmed Prima 05/19/1961 (56 y.o. Other Clinician: Date of Birth/Sex: Male) Treating ROBSON, MICHAEL Primary Care Jalayiah Bibian: Lorelee Market Naara Kelty/Extender: G Referring Zacherie Honeyman: Mcarthur Rossetti in Treatment: 0 Activities of Daily Living Items Answer Activities of Daily Living (Please select one for each item) Drive Automobile Completely Able Take Medications Completely Able Use Telephone Completely Able Care for Appearance Completely Able Use Toilet Completely Able Bath / Shower Completely Able Dress Self Completely Able Feed Self Completely Able Walk Completely Able Get In / Out Bed Completely Able Housework Completely Able Prepare Meals  Completely [place] for Self Completely Able Electronic Signature(s) Signed: 04/08/2017 4:19:28 PM By: Alric Quan Entered By: Alric Quan on 04/08/2017 10:44:44 [name] ([id number]) -------------------------------------------------------------------------------- Education Assessment Details Patient Name: [name]. Date of Service: 04/08/2017 10:30 AM Medical Record Patient Account Number: [id number] Number: Treating RN: Ahmed Prima 06-25-1961 (56 y.o. Other Clinician: Date of Birth/Sex: Male) Treating ROBSON, MICHAEL Primary Care Herma Uballe: Lorelee Market Zarea Diesing/Extender: G Referring Terrell Ostrand: Mcarthur Rossetti in Treatment: 0 Primary Learner Assessed: Patient Learning Preferences/Education Level/Primary Language Learning Preference: Explanation, Printed Material Highest Education Level: High School Preferred Language: English Cognitive Barrier Assessment/Beliefs Language Barrier: No Translator Needed: No Memory Deficit: No Emotional Barrier: No Cultural/Religious Beliefs Affecting Medical No Care: Physical Barrier Assessment Impaired Vision: Yes Glasses Impaired Hearing: No Decreased Hand dexterity: No Knowledge/Comprehension Assessment Knowledge Level: Medium Comprehension Level: Medium Ability to understand written Medium instructions: Ability to understand verbal Medium instructions: Motivation Assessment Anxiety Level: Calm Cooperation: Cooperative Education Importance: Acknowledges Need Interest in Health Problems: Asks Questions Perception: Coherent Willingness to Engage in Self- Medium Management Activities: Readiness to Engage in Self- Medium Management Activities: [name], [name] ([id number]) Electronic Signature(s) Signed: 04/08/2017 4:19:28 PM By: Alric Quan Entered By: Alric Quan on 04/08/2017 10:45:06 [name], [name]  ([id number]) -------------------------------------------------------------------------------- Fall Risk Assessment Details Patient Name: [name]. Date of Service: 04/08/2017 10:30 AM Medical Record Patient Account Number: [id number] Number: Treating RN: Ahmed Prima [birth year] (56 y.o. Other Clinician: Date of Birth/Sex: Male) Treating ROBSON, MICHAEL Primary Care Homero Hyson: Lorelee Market Rielle Schlauch/Extender: G Referring Jalaine Riggenbach: Mcarthur Rossetti in Treatment: 0 Fall Risk Assessment Items Have you had 2 or more falls in the last 12 monthso 0 Yes Have you had any fall that resulted in injury in the last 12 monthso 0 Yes FALL RISK ASSESSMENT: History of falling - immediate or within 3 months 25 Yes Secondary diagnosis 0 No Ambulatory aid None/bed rest/wheelchair/nurse 0  No Crutches/cane/walker 0 No Furniture 0 No IV Access/Saline Lock 0 No Gait/Training Normal/bed rest/immobile 0 No Weak 0 No Impaired 0 No Mental Status Oriented to own ability 0 Yes Electronic Signature(s) Signed: 04/08/2017 4:19:28 PM By: Alric Quan Entered By: Alric Quan on 04/08/2017 10:45:32 [name] ([id number]) -------------------------------------------------------------------------------- Foot Assessment Details Patient Name: [name]. Date of Service: 04/08/2017 10:30 AM Medical Record Patient Account Number: [id number] Number: Treating RN: Ahmed Prima 04-07-1961 (56 y.o. Other Clinician: Date of Birth/Sex: Male) Treating ROBSON, MICHAEL Primary Care Binyomin Brann: Lorelee Market Deeann Servidio/Extender: G Referring Lysander Calixte: Francee Piccolo Weeks in Treatment: 0 Foot Assessment Items Site Locations + = Sensation present, - = Sensation absent, C = Callus, U = Ulcer R = Redness, W = Warmth, M = Maceration, PU = Pre-ulcerative lesion F = Fissure, S = Swelling, D = Dryness Assessment Right: Left: Other Deformity: No No Prior Foot Ulcer: No  No Prior Amputation: No No Charcot Joint: No No Ambulatory Status: Ambulatory Without Help Gait: Steady Electronic Signature(s) Signed: 04/08/2017 4:19:28 PM By: Alric Quan Entered By: Alric Quan on 04/08/2017 10:48:43 [name] ([id number]) -------------------------------------------------------------------------------- Nutrition Risk Assessment Details Patient Name: [name]. Date of Service: 04/08/2017 10:30 AM Medical Record Patient Account Number: [id number] Number: Treating RN: Ahmed Prima 12/05/1960 (56 y.o. Other Clinician: Date of Birth/Sex: Male) Treating ROBSON, MICHAEL Primary Care Notnamed Scholz: Lorelee Market Miranda Garber/Extender: G Referring Kymberley Raz: Francee Piccolo Weeks in Treatment: 0 Height (in): 70 Weight (lbs): 267 Body Mass Index (BMI): 38.3 Nutrition Risk Assessment Items NUTRITION RISK SCREEN: I have an illness or condition that made me change the kind and/or 0 No amount of food I eat I eat fewer than two meals per day 0 No I eat few fruits and vegetables, or milk products 0 No I have three or more drinks of beer, liquor or wine almost every day 0 No I have tooth or mouth problems that make it hard for me to eat 0 No I don't always have enough money to buy the food I need 0 No I eat alone most of the time 0 No I take three or more different prescribed or over-the-counter drugs a 1 Yes day Without wanting to, I have lost or gained 10 pounds in the last six 0 No months I am not always physically able to shop, cook and/or feed myself 0 No Nutrition Protocols Good Risk Protocol 0 No interventions needed Moderate Risk Protocol Electronic Signature(s) Signed: 04/08/2017 4:19:28 PM By: Alric Quan Entered By: Alric Quan on 04/08/2017 10:46:09

## 2017-04-09 NOTE — Progress Notes (Signed)
[name] ([id number]) Visit Report for 04/08/2017 Allergy List Details Patient Name: [name], [name]. Date of Service: 04/08/2017 10:30 AM Medical Record Number: [id number] Patient Account Number: [id number] Date of Birth/Sex: 25-Mar-1961 (56 y.o. Male) Treating RN: Ahmed Prima Primary Care Anastaisa Wooding: Lorelee Market Other Clinician: Referring Kashmir Lysaght: Francee Piccolo Treating Yeiren Whitecotton/Extender: Ricard Dillon Weeks in Treatment: 0 Allergies Active Allergies Bactrim Allergy Notes Electronic Signature(s) Signed: 04/08/2017 4:19:28 PM By: Alric Quan Entered By: Alric Quan on 04/08/2017 10:40:05 [name] ([id number]) -------------------------------------------------------------------------------- Arrival Information Details Patient Name: [name]. Date of Service: 04/08/2017 10:30 AM Medical Record Number: [id number] Patient Account Number: [id number] Date of Birth/Sex: 1960/11/18 (56 y.o. Male) Treating RN: Ahmed Prima Primary Care Beatris Belen: Lorelee Market Other Clinician: Referring Knoxx Boeding: Francee Piccolo Treating Ladarrian Asencio/Extender: Tito Dine in Treatment: 0 Visit Information Patient Arrived: Ambulatory Arrival Time: 10:33 Accompanied By: self Transfer Assistance: None Patient Identification Verified: Yes Secondary Verification Process Yes Completed: Patient Requires Transmission-Based No Precautions: Patient Has Alerts: Yes Patient Alerts: DM II History Since Last Visit All ordered tests and consults were completed: No Added or deleted any medications: No Any new allergies or adverse reactions: No Had a fall or experienced change in activities of daily living that may affect risk of falls: No Signs or symptoms of abuse/neglect since last visito No Hospitalized since last visit: No Has Footwear/Offloading in Place as Prescribed: Yes Left: Surgical Shoe with Pressure Relief Insole Electronic Signature(s) Signed:  04/08/2017 4:19:28 PM By: Alric Quan Entered By: Alric Quan on 04/08/2017 10:38:10 [name] ([id number]) -------------------------------------------------------------------------------- Clinic Level of Care Assessment Details Patient Name: [name]. Date of Service: 04/08/2017 10:30 AM Medical Record Number: [id number] Patient Account Number: [id number] Date of Birth/Sex: [birth year] (56 y.o. Male) Treating RN: Ahmed Prima Primary Care Aviya Jarvie: Lorelee Market Other Clinician: Referring Adely Facer: Francee Piccolo Treating Yareli Carthen/Extender: Ricard Dillon Weeks in Treatment: 0 Clinic Level of Care Assessment Items TOOL 1 Quantity Score X - Use when EandM and Procedure is performed on INITIAL visit 1 0 ASSESSMENTS - Nursing Assessment / Reassessment X - General Physical Exam (combine w/ comprehensive assessment (listed just 1 20 below) when performed on new pt. evals) X - Comprehensive Assessment (HX, ROS, Risk Assessments, Wounds Hx, etc.) 1 25 ASSESSMENTS - Wound and Skin Assessment / Reassessment []  - Dermatologic / Skin Assessment (not related to wound area) 0 ASSESSMENTS - Ostomy and/or Continence Assessment and Care []  - Incontinence Assessment and Management 0 []  - Ostomy Care Assessment and Management (repouching, etc.) 0 PROCESS - Coordination of Care X - Simple Patient / Family Education for ongoing care 1 15 []  - Complex (extensive) Patient / Family Education for ongoing care 0 []  - Staff obtains Programmer, systems, Records, Test Results / Process Orders 0 []  - Staff telephones HHA, Nursing Homes / Clarify orders / etc 0 []  - Routine Transfer to another Facility (non-emergent condition) 0 []  - Routine Hospital Admission (non-emergent condition) 0 X - New Admissions / Biomedical engineer / Ordering NPWT, Apligraf, etc. 1 15 []  - Emergency Hospital Admission (emergent condition) 0 PROCESS - Special Needs []  - Pediatric / Minor Patient Management  0 []  - Isolation Patient Management 0 [name], [name]. ([id number]) []  - Hearing / Language / Visual special needs 0 []  - Assessment of Community assistance (transportation, D/C planning, etc.) 0 []  - Additional assistance / Altered mentation 0 []  - Support Surface(s) Assessment (bed, cushion, seat, etc.) 0 INTERVENTIONS - Miscellaneous []  - External ear exam 0 []  -  Patient Transfer (multiple staff / Civil Service fast streamer / Similar devices) 0 []  - Simple Staple / Suture removal (25 or less) 0 []  - Complex Staple / Suture removal (26 or more) 0 []  - Hypo/Hyperglycemic Management (do not check if billed separately) 0 X - Ankle / Brachial Index (ABI) - do not check if billed separately 1 15 Has the patient been seen at the hospital within the last three years: Yes Total Score: 90 Level Of Care: New/Established - Level 3 Electronic Signature(s) Signed: 04/08/2017 4:19:28 PM By: Alric Quan Entered By: Alric Quan on 04/08/2017 13:06:48 [name] ([id number]) -------------------------------------------------------------------------------- Encounter Discharge Information Details Patient Name: [name]. Date of Service: 04/08/2017 10:30 AM Medical Record Number: [id number] Patient Account Number: [id number] Date of Birth/Sex: 1961/04/23 (56 y.o. Male) Treating RN: Ahmed Prima Primary Care Cortina Vultaggio: Lorelee Market Other Clinician: Referring Walden Statz: Francee Piccolo Treating Khamya Topp/Extender: Tito Dine in Treatment: 0 Encounter Discharge Information Items Discharge Pain Level: 0 Discharge Condition: Stable Ambulatory Status: Ambulatory Discharge Destination: Home Transportation: Private Auto Accompanied By: self Schedule Follow-up Appointment: Yes Medication Reconciliation completed and provided to Patient/Care No [name]: Provided on Clinical Summary of Care: 04/08/2017 Form Type Recipient Paper Patient HP Electronic Signature(s) Signed: 04/08/2017  4:19:28 PM By: Alric Quan Entered By: Alric Quan on 04/08/2017 11:26:32 [name] ([id number]) -------------------------------------------------------------------------------- Lower Extremity Assessment Details Patient Name: [name]. Date of Service: 04/08/2017 10:30 AM Medical Record Number: [id number] Patient Account Number: [id number] Date of Birth/Sex: 06/02/1961 (56 y.o. Male) Treating RN: Ahmed Prima Primary Care Vikas Wegmann: Lorelee Market Other Clinician: Referring Avenell Sellers: Francee Piccolo Treating Symphonie Schneiderman/Extender: Ricard Dillon Weeks in Treatment: 0 Edema Assessment Assessed: [Left: No] [Right: No] Edema: [Left: Ye] [Right: s] Calf Left: Right: Point of Measurement: 36 cm From Medial Instep 41 cm cm Ankle Left: Right: Point of Measurement: 9 cm From Medial Instep 24.5 cm cm Vascular Assessment Pulses: Dorsalis Pedis Palpable: [Left:No] Posterior Tibial Palpable: [Left:No] Extremity colors, hair growth, and conditions: Extremity Color: [Left:Mottled] Hair Growth on Extremity: [Left:Yes] Temperature of Extremity: [Left:Warm] Capillary Refill: [Left:< 3 seconds] Blood Pressure: Brachial: [Left:128] Dorsalis Pedis: 130 [Left:Dorsalis Pedis:] Ankle: Posterior Tibial: 160 [Left:Posterior Tibial: 1.25] Toe Nail Assessment Left: Right: Thick: Yes Discolored: Yes Deformed: No Improper Length and Hygiene: No Electronic Signature(s) [name], [name] ([id number]) Signed: 04/08/2017 4:19:28 PM By: Alric Quan Entered By: Alric Quan on 04/08/2017 10:56:39 [name] ([id number]) -------------------------------------------------------------------------------- Multi Wound Chart Details Patient Name: [name]. Date of Service: 04/08/2017 10:30 AM Medical Record Number: [id number] Patient Account Number: [id number] Date of Birth/Sex: [birth year] (56 y.o. 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] [N/A:N/A] Comorbid History: [9:Hypertension, Type II Diabetes, Neuropathy] [N/A:N/A] Date Acquired: [9:03/18/2017] [N/A:N/A] Weeks of Treatment: [9:0] [N/A:N/A] Wound Status: [9:Open] [N/A:N/A] Measurements L x W x D 2x0.4x0.2 [N/A:N/A] (cm) Area (cm) : [9:0.628] [N/A:N/A] Volume (cm) : [9:0.126] [N/A:N/A] Classification: [9:Grade 1] [N/A:N/A] Exudate Amount: [9:Large] [N/A:N/A] Exudate Type: [9:Serous] [N/A:N/A] Exudate Color: [9:amber] [N/A:N/A] Wound Margin: [9:Distinct, outline attached] [N/A:N/A] Granulation Amount: [9:Medium (34-66%)] [N/A:N/A] Granulation Quality: [9:Red] [N/A:N/A] Necrotic Amount: [9:Medium (34-66%)] [N/A:N/A] Necrotic Tissue: [9:Eschar, Adherent Slough] [N/A:N/A] Epithelialization: [  9:None] [N/A:N/A] Debridement: [9:Debridement ([id number])] [N/A:N/A] Pre-procedure [9:11:01] [N/A:N/A] Verification/Time Out Taken: Pain Control: [N/A:N/A] Lidocaine 4% Topical Solution Tissue Debrided: Fibrin/Slough, Exudates, N/A N/A Subcutaneous Level: Skin/Subcutaneous N/A N/A Tissue Debridement Area (sq 0.8 N/A N/A cm): Instrument: Curette N/A N/A Bleeding: Minimum N/A N/A Hemostasis Achieved: Pressure N/A N/A Procedural Pain: 0 N/A N/A Post Procedural Pain: 0 N/A N/A Debridement Treatment Procedure was tolerated N/A N/A Response: well Post Debridement 2x0.5x0.2 N/A N/A Measurements L x W x D (cm) Post Debridement 0.157 N/A N/A Volume:  (cm) Periwound Skin Texture: No Abnormalities Noted N/A N/A Periwound Skin No Abnormalities Noted N/A N/A Moisture: Periwound Skin Color: No Abnormalities Noted N/A N/A Temperature: No Abnormality N/A N/A Tenderness on Yes N/A N/A Palpation: Wound Preparation: Ulcer Cleansing: N/A N/A Rinsed/Irrigated with Saline Topical Anesthetic Applied: Other: lidocaine 4% Procedures Performed: Debridement N/A N/A Treatment Notes Electronic Signature(s) Signed: 04/08/2017 4:19:28 PM By: Alric Quan Entered By: Alric Quan on 04/08/2017 11:25:28 [name] ([id number]) -------------------------------------------------------------------------------- [place] Details Patient Name: [name], [name]. Date of Service: 04/08/2017 10:30 AM Medical Record Number: [id number] Patient Account Number: [id number] Date of Birth/Sex: 03/17/1961 (56 y.o. Male) Treating RN: Ahmed Prima Primary Care Essie Gehret: Lorelee Market Other Clinician: Referring Raymond Bhardwaj: Francee Piccolo Treating Huel Centola/Extender: Tito Dine in Treatment: 0 Active Inactive ` Abuse / Safety / Falls / Self Care Management Nursing Diagnoses: History of Falls Goals: Patient will not experience any injury related to falls Date Initiated: 04/08/2017 Target Resolution Date: 08/09/2017 Goal Status: Active Interventions: Assess fall risk on admission and as needed Notes: ` Nutrition Nursing Diagnoses: Imbalanced nutrition Impaired glucose control: actual or potential Potential for alteratiion in Nutrition/Potential for imbalanced nutrition Goals: Patient/caregiver agrees to and verbalizes understanding of need to use nutritional supplements and/or vitamins as prescribed Date Initiated: 04/08/2017 Target Resolution Date: 08/09/2017 Goal Status: Active Patient/caregiver will maintain therapeutic glucose control Date Initiated: 04/08/2017 Target Resolution Date: 08/09/2017 Goal Status:  Active Interventions: Assess patient nutrition upon admission and as needed per policy Provide education on elevated blood sugars and impact on wound healing Provide education on nutrition [name], [name] ([id number]) Notes: ` Orientation to the Wound Care Program Nursing Diagnoses: Knowledge deficit related to the wound healing center program Goals: Patient/caregiver will verbalize understanding of the [facility] Date Initiated: 04/08/2017 Target Resolution Date: 05/10/2017 Goal Status: Active Interventions: Provide education on orientation to the wound center Notes: ` Pain, Acute or Chronic Nursing Diagnoses: Pain, acute or chronic: actual or potential Potential alteration in comfort, pain Goals: Patient/caregiver will verbalize adequate pain control between visits Date Initiated: 04/08/2017 Target Resolution Date: 08/09/2017 Goal Status: Active Interventions: Complete pain assessment as per visit requirements Notes: ` Wound/Skin Impairment Nursing Diagnoses: Impaired tissue integrity Knowledge deficit related to ulceration/compromised skin integrity Goals: Ulcer/skin breakdown will have a volume reduction of 80% by week 12 Date Initiated: 04/08/2017 Target Resolution Date: 08/02/2017 Goal Status: Active [name], [name] ([id number]) Interventions: Assess patient/caregiver ability to perform ulcer/skin care regimen upon admission and as needed Assess ulceration(s) every visit Notes: Electronic Signature(s) Signed: 04/08/2017 4:19:28 PM By: Alric Quan Entered By: Alric Quan on 04/08/2017 11:25:09 [name] ([id number]) -------------------------------------------------------------------------------- Pain Assessment Details Patient Name: [name]. Date of Service: 04/08/2017 10:30 AM Medical Record Number: [id number] Patient Account Number: [id number] Date of Birth/Sex: October 24, 1960 (56 y.o. Male) Treating RN: Ahmed Prima Primary Care Pasty Manninen: Lorelee Market Other Clinician: Referring Kazimierz Springborn: Francee Piccolo Treating Shariyah Eland/Extender: Ricard Dillon Weeks in Treatment: 0 Active Problems  Location of Pain Severity and Description of Pain Patient Has Paino Yes Site Locations Pain Location: Pain in Ulcers Rate the pain. Current Pain Level: 5 Character of Pain Describe the Pain: Aching Pain Management and Medication Current Pain Management: Electronic Signature(s) Signed: 04/08/2017 4:19:28 PM By: Alric Quan Entered By: Alric Quan on 04/08/2017 10:38:19 [name] ([id number]) -------------------------------------------------------------------------------- Patient/Caregiver Education Details Patient Name: [name]. Date of Service: 04/08/2017 10:30 AM Medical Record Patient Account Number: [id number] Number: Treating RN: Ahmed Prima 03-Aug-1960 (56 y.o. Other Clinician: Date of Birth/Gender: Male) Treating [facility]BSON, [facility] Primary Care Physician: Lorelee Market Physician/Extender: G Referring Physician: Mcarthur Rossetti in Treatment: 0 Education Assessment Education Provided To: Patient Education Topics Provided Elevated Blood Sugar/ Impact on Healing: Handouts: Elevated Blood Sugars: How Do They Affect Wound Healing Methods: Explain/Verbal Responses: State content correctly Nutrition: Handouts: Elevated Blood Sugars: How Do They Affect Wound Healing Methods: Explain/Verbal Responses: State content correctly Welcome To [facility]: Handouts: Welcome To [facility] Methods: Explain/Verbal Responses: State content correctly Wound/Skin Impairment: Handouts: Other: do not get wrap wet Methods: Explain/Verbal Responses: State content correctly Electronic Signature(s) Signed: 04/08/2017 4:19:28 PM By: Alric Quan Entered By: Alric Quan on 04/08/2017 11:27:05 [name]  ([id number]) -------------------------------------------------------------------------------- Wound Assessment Details Patient Name: [name]. Date of Service: 04/08/2017 10:30 AM Medical Record Number: [id number] Patient Account Number: [id number] Date of Birth/Sex: 1961/02/25 (56 y.o. Male) Treating RN: Ahmed Prima Primary Care Alif Petrak: Lorelee Market Other Clinician: Referring Spiros Greenfeld: Francee Piccolo Treating Kyndra Condron/Extender: Ricard Dillon Weeks in Treatment: 0 Wound Status Wound Number: 9 Primary Diabetic Wound/Ulcer of the Lower Etiology: Extremity Wound Location: Left Foot - Lateral Wound Status: Open Wounding Event: Gradually Appeared Comorbid Hypertension, Type II Diabetes, Date Acquired: 03/18/2017 History: Neuropathy Weeks Of Treatment: 0 Clustered Wound: No Photos Photo Uploaded By: Alric Quan on 04/08/2017 14:32:31 Wound Measurements Length: (cm) 2 Width: (cm) 0.4 Depth: (cm) 0.2 Area: (cm) 0.628 Volume: (cm) 0.126 % Reduction in Area: % Reduction in Volume: Epithelialization: None Tunneling: No Undermining: No Wound Description Classification: Grade 1 Foul Odor Aft Wound Margin: Distinct, outline attached Slough/Fibrin Exudate Amount: Large Exudate Type: Serous Exudate Color: amber er Cleansing: No o Yes Wound Bed Granulation Amount: Medium (34-66%) Granulation Quality: Red Necrotic Amount: Medium (34-66%) Necrotic Quality: Eschar, Adherent [address], [place] F. ([id number]) Periwound Skin Texture Texture Color No Abnormalities Noted: No No Abnormalities Noted: No Moisture Temperature / Pain No Abnormalities Noted: No Temperature: No Abnormality Tenderness on Palpation: Yes Wound Preparation Ulcer Cleansing: Rinsed/Irrigated with Saline Topical Anesthetic Applied: Other: lidocaine 4%, Treatment Notes Wound #9 (Left, Lateral Foot) 1. Cleansed with: Clean wound with Normal Saline 2. Anesthetic Topical Lidocaine  4% cream to wound bed prior to debridement 4. Dressing Applied: Hydrogel Prisma Ag 5. Secondary Dressing Applied ABD Pad Dry Gauze 7. Secured with Tape Notes kerlix, coban, unna to Engineer, production) Signed: 04/08/2017 4:19:28 PM By: Alric Quan Entered By: Alric Quan on 04/08/2017 10:59:04 [name] ([id number]) -------------------------------------------------------------------------------- Vitals Details Patient Name: [name]. Date of Service: 04/08/2017 10:30 AM Medical Record Number: [id number] Patient Account Number: [id number] Date of Birth/Sex: 12/11/1960 (56 y.o. Male) Treating RN: Ahmed Prima Primary Care Cliff Damiani: Lorelee Market Other Clinician: Referring Jerrin Recore: Francee Piccolo Treating Nikoloz Huy/Extender: Ricard Dillon Weeks in Treatment: 0 Vital Signs Time Taken: 10:38 Temperature (F): 98.0 Height (in): 70 Pulse (bpm): 96 Source: Stated Respiratory Rate (breaths/min): 18 Weight (lbs): 272 Blood Pressure (mmHg): 142/82 Source: Measured Reference  Range: 80 - 120 mg / dl Body Mass Index (BMI): 39 Electronic Signature(s) Signed: 04/08/2017 4:19:28 PM By: Alric Quan Entered By: Alric Quan on 04/08/2017 11:33:57

## 2017-04-15 ENCOUNTER — Encounter: Payer: Commercial Managed Care - PPO | Admitting: Internal Medicine

## 2017-04-15 DIAGNOSIS — E11621 Type 2 diabetes mellitus with foot ulcer: Secondary | ICD-10-CM | POA: Diagnosis not present

## 2017-04-16 NOTE — Progress Notes (Signed)
[name] ([id number]) Visit Report for 04/15/2017 Debridement Details Patient Name: [name], [name]. Date of Service: 04/15/2017 8:45 AM Medical Record Patient Account Number: [id number] Number: Treating RN: Ahmed Prima August 02, 1960 (56 y.o. Other Clinician: Date of Birth/Sex: Male) Treating ROBSON, [address] Primary Care Provider: Lorelee Market Provider/Extender: G Referring Provider: Lorelee Market Weeks in Treatment: 1 Debridement Performed for Wound #9 Left,Lateral Foot Assessment: Performed By: Physician Ricard Dillon, MD Debridement: Debridement Severity of Tissue Pre Fat layer exposed Debridement: Pre-procedure Verification/Time Out Yes - 09:17 Taken: Start Time: 09:18 Pain Control: Lidocaine 4% Topical Solution Level: Skin/Subcutaneous Tissue Total Area Debrided (L x 1 (cm) x 0.5 (cm) = 0.5 (cm) W): Tissue and other Viable, Non-Viable, Exudate, Fibrin/Slough, Subcutaneous material debrided: Instrument: Curette Bleeding: Minimum Hemostasis Achieved: Pressure End Time: 09:21 Procedural Pain: 0 Post Procedural Pain: 0 Response to Treatment: Procedure was tolerated well Post Debridement Measurements of Total Wound Length: (cm) 1 Width: (cm) 0.6 Depth: (cm) 0.3 Volume: (cm) 0.141 Character of Wound/Ulcer Post Requires Further Debridement Debridement: Severity of Tissue Post Debridement: Fat layer exposed Post Procedure Diagnosis Same as Pre-procedure KEKAI, GETER ([id number]) Electronic Signature(s) Signed: 04/15/2017 4:50:14 PM By: [name] MD Signed: 04/15/2017 5:01:03 PM By: Alric Quan Entered By: [name] on 04/15/2017 09:26:56 [name] ([id number]) -------------------------------------------------------------------------------- HPI Details Patient Name: [name], [name] Date of Service: 04/15/2017 8:45 AM Medical Record Patient Account Number: [id number] Number: Treating  RN: Ahmed Prima October 06, 1960 (56 y.o. Other Clinician: Date of Birth/Sex: Male) Treating ROBSON, MICHAEL Primary Care Provider: Lorelee Market Provider/Extender: G Referring Provider: Lorelee Market Weeks in Treatment: 1 History of Present Illness HPI Description: Pleasant 56 year old with history of diabetes (Hgb A1c 10.8 in 2014) and peripheral neuropathy. No PVD. L ABI 1.1. Status post right great toe partial amputation years ago. He was at work and on 10/22/2014, was injured by a cart, and suffered an ulceration to his left anterior calf. He says that it subsequently became infected, and he was treated with a course of antibiotics. He was found on initial exam to have an ulceration on the dorsum of his left third toe. He was unaware of this and attributes it to pressure from his steel toed boots. More recently he injured his right anterior calf on a cart. Ambulating normally per his baseline. He has been undergoing regular debridements, applying mupirocin cream, and an Ace wrap for edema control. He returns to clinic for follow-up and is without complaints. No pain. No fever or chills. No drainage. 10/25/15; this is a 56 year old man who has type II diabetes with diabetic polyneuropathy. He tells me that he fractured his left fifth metatarsal in June 2016 when he presented with swelling. He does not recall a specific injury. His hemoglobin A1c was apparently too high at the time for consideration of surgery and he was put in some form of offloading. Ultimately he went to surgery in December with an allograft from his calcaneus to this site, plate and screws. He had an x-ray of the foot in March that showed concerns about nonunion. He tells me that in March he had to move and basically moved himself. He was on his foot a lot and then noticed some drainage from an open area. He has been following with his orthopedic surgeon Dr. Doran Durand. He has been applying a felt donut, dry  dressing and using his heel healing sandal. 11/01/15; this is a patient I saw last week for the first time. He had a small  open wound on the plantar aspect of his left foot at roughly the level of the base of his fifth metatarsal. He had a considerable degree of thickened skin around this wound on the plantar aspect which I thought was from chronic pressure on this area. He tells [place] that he had drainage over the course of the week. No systemic symptoms. 11/08/15; culture last week grew Citrobacter korseri. This should've been sensitive to the Augmentin I gave him. He has seen Dr. Doran Durand who did his initial surgery and according the patient the plan is to give this another month and then the hardware might need to come out of this. This seems like a reasonable plan. I will adjust his antibiotics to ciprofloxacin which probably should continue for at least another 2 weeks. I gave him 10 days worth today 11/22/15 the patient has completed antibiotics. He has an appointment with Dr. Doran Durand this Friday. There is improved dimensions around the wound on the left fifth metatarsal base 11/29/15; the patient has completed antibiotics last week. Apparently his appointment with Dr. Doran Durand it is not until this Friday. Dimensions are roughly the same. 12/06/15; saw Dr. Doran Durand. No x-ray told the end of the month, next appointment June 30. We have been using Aquacel Ag 12/13/15: No major change this week. Using Aquacel AG 621/17; arrived this week with maceration around the wound. There was quite a bit of undermining which KODI, STEIL. ([id number]) required surgical debridement. I changed him to Scnetx last week, by the patient's admission he was up on this more this week 12/27/15; macerated tissue around the wound is removed with a scalpel and pickups. There is no undermining. Nonviable subcutaneous tissue and skin taken from the superior circumference of the wound is slough from the  surface. READMISSION 03/06/16 since I last saw this patient at the end of June, he went for surgery on 01/11/16 by Dr. Doran Durand of orthopedics. He had a left foot irrigation and debridement, removal of hardware and placement of wound VAC. He is also been followed by Dr. Megan Salon of infectious disease and completed a six-week course of IV Rocephin for group B strep and Enterobacter in the bone at the time of surgery. Apparently at the time of surgery the bone looked healthy so I don't think any bone was actually removed. He has been using silver alginate based dressings on the same wound area at the base of the left fifth metatarsal on the left. I note that he is also had arterial studies on 01/08/16, these showed a left ABI of 1.2 to and a right ABI of 1.3. Waveforms were listed as biphasic. He was not felt to have any specific arterial issues. 03/13/16; no real change in the condition of the wound at the left lateral foot at roughly the base of his fifth metatarsal. Use silver alginate last week. 03/18/16 arrives today with no open area. Being suspicious of the overlying callus I. Some of this back although I see nothing but covering tissue here/epithelium. There is no surrounding tenderness READMISSION 04/16/16 this is a patient I discharged about a month ago. Initially a surgical wound on the lateral aspect of his left foot which subsequently became infected. The story he is giving today that he went back into his own modified shoe started to notice pain 2 weeks ago he was seen in Dr. Nona Dell office by a physician assistant last Wednesday and according the patient was told that everything looked fine however this is clearly now broken  open and he has an open wound in the same spot that we have been dealing with repetitively. Situation is complicated by the fact that he is running short of money on long-term disability. He has not taken his insulin and at least 2 weeks was previously on NovoLog short  acting insulin on a sliding scale and TRESIBA 35 units at bedtime. He is no longer able to afford any of his medication he was in the x-ray on 10/15. A plain x-ray showed healed fracture of the left fifth metatarsal bone status post removal of the associated plate and screw fixation hardware. There was no acute appearing osseous abnormality. His blood work showed a white count of 9.2 with an essentially normal differential comprehensive metabolic panel was normal. Previous CT scan of the foot on 01/08/16 showed no osteomyelitis previous vascular workup showed no evidence of significant PAD on 01/13/16 04/23/16; culture I did last week grew enterococcus [ampicillin sensitive] and MRSA. He saw infectious disease yesterday. They stop the clindamycin and ordered an MRI. This is not unreasonable. All the hardware is out of the foot at this point. 04/30/16 at this point in time we are still awaiting the results of the MRI at this point in time. Patient did have an area which appears to be somewhat macerated in the proximal portion of the wound where there is overlying necrotic skin that is doing nothing more than trapping fluid underneath. He continues to state that he does have some discomfort and again the concern is for the possibility of osteomyelitis hence the reason for the MRI order. We have been using a silver alginate dressing but again I think the reason this with his macerated as it was is that the dressing obscene could not reach the entirety of the wound due to some necrotic skin covering the proximal portion. No bleeding noted at this point in time on initial evaluation. 05/07/16 we receive the results of patient's MRI which shows that he has no evidence of osteomyelitis. This obviously is excellent news. Patient was definitely happy to hear this. He tells me the wound appears to be doing very well at this point in time and is pleased with the progress currently. 05/15/16; as noted the  patient did not have osteomyelitis. He has been released by infectious disease and MARSTON, MCCADDEN ([id number]) orthopedics. His wound is still open he had a debridement last week but complain when he got home he "bleeding for half a day". He is not had any pain. We have been using silver alginate with Kerlix gauze wrap. 05/28/16; patient arrives today with the wound in much the same condition as last time. He has a small opening on the lateral aspect of his foot however with debridement there is clear undermining medially there is no real evidence of infection here and I didn't see any point in culturing this. One would have to wonder if this isn't a simple matter of in adequate offloading as he is been using a healing sandal. 06/05/16; the open area is now on the plantar aspect of his foot and not decide. The wound almost appears to have "migrated". This was the term use by our intake nurse. 06/12/16; open area on the plantar aspect of his foot. Base of the wound looks very healthy. This will be his second week in a total contact cast 06/19/16; patient arrived today in a total contact cast. There was some expectation from our staff and myself that this area would be healed. Unfortunately  the area was boggy and with rec pressure a fairly substantial amount of purulent drainage was obtained. Specimen obtained for culture. The patient had no complaints of systemic problems including fever or chills or instability of his diabetes. There was no pain in the foot. Nevertheless a extensive debridement was required. 06/26/16; patient's culture from the abscess last week grew a combination of MRSA and ampicillin sensitive enterococcus. I had him on Augmentin and Septra however I have elected to give him a full 10 day course of Zyvox instead as I Recently treated this combination of organisms with Augmentin and Septra before. Arrives today with no systemic symptoms 07/03/16; the patient has 2 more  treatments of Zyvox and then he is finished antibiotics the wound has improved now mostly on the lateral aspect of his foot. There is still some tenderness when he walks. 07/10/16; patient arrives today with Zyvox completed. He only has a small open area remaining. 07/24/16; she arrives here with no overt open area. The covering is thick callus/eschar. Nevertheless there is no open area here. He has some tenderness underneath the area but no overt infection is observed no drainage. The patient has a deformity in the foot with this area weekly to be exposed to more pressure in his foot where nevertheless that something we are going to have to deal with going forward. The patient has diabetic workboots and diabetic shoes. He has had a long difficult course with the area here. This started as a fracture at work. He had bone grafting from his calcaneus and screws. This got infected he had to have more surgery on the area. Bone at the time of that surgery I think showed enterococcus and group B strep. He had 6 weeks of Rocephin. Since then the area has waxed and waned in its difficulty. Recently he had an MRI in December that did not show osteomyelitis nevertheless he had an abscess that grew MRSA and enterococcus which I elected to treat with Zyvox. This was in December and this wound is actually "healed" over 08/21/16; the patient came in today for his one-month follow-up visit. The area on the lateral aspect of his left foot looks much the same as some month ago. There is no evidence of an open wound here. However the patient tells me about a week after he went back to work he developed severe pain and swelling in the plantar aspect of his right foot first and fifth metatarsal heads. 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 He went to see his podiatrist at [facility]. She told him that he could not work on his  feet. She told him to go back and his cam walking boot on the left. Not have open wounds obviously on the right. The patient is actually gone ahead and retired from his job because he does not feel he can work on his feet 09/18/16; patient comes back in saying he recently had some pain on the lateral aspect of his left foot. Asked that we look at this. Other than that all of his wounds have a viable surface. He has diabetic shoes. He is retired from work. READMISSION Linell Shawn is a 56 year old man who we cared for for a prolonged period of time in late 2017 into March 2018. At that point he had suffered a fracture of his left fifth metatarsal at about the level of the base of the  fifth metatarsal. He had surgical repair by Dr. Meryl Dare and he developed a very refractory wound over the METRO, EDENFIELD. ([id number]) fifth metatarsal. Ultimately this became infected he required hardware removal this eventually closed over and we discharged from the clinic in March of this year. He tells me as well until roughly 3 weeks ago when he developed increasing pain in the same area making it difficult for him to sleep. He was admitted to hospital from 9/5 through 9/7 with now an ulcer in the same area on the lateral aspect of his left foot at the level of the base of the metatarsal. X-rays and MRIs during this hospitalization were negative. Wound culture showed a combination of Escherichia coli, Morganella and Pseudomonas. He was given IV Cipro and vancomycin the hospital and then discharged home on Cipro and Doxy. He returned to the ER on 9/16 with leg swelling and discomfort. I don't think anything was added at that point although he did have an ultrasound of the left leg that was negative for DVT. He was back in the ER on 10/4 again with pain in the area he was given Bactrim but states he had a significant allergic reaction to that which has abated once he stopped the Bactrim. I don't think he had a full  course. He went to see his podiatrist yesterday who recommended some form of foot soak. He has a Darco forefoot offloading boot The patient is a type II diabetic on insulin. Tells me his recent hemoglobin A1c was 7. Arterial studies done in July 2017 showed an ABI in the right of 1.3 on the left 1.2 to biphasic waveforms bilaterally. He was not felt to have arterial disease. As noted the patient has had 2 MRIs most recently on 10/4 this showed no evidence of an abscess or osteomyelitis and no change since the prior study of 03/05/17 there was nonspecific edema on the dorsum of the foot. ABIs in this clinic today 1.2 which would be unchanged from his study done in July/17 04/15/17; now down to 1 small wound on the left lateral foot. We wrapped him and applied collagen last week and the foot is fairly macerated. Electronic Signature(s) Signed: 04/15/2017 4:50:14 PM By: [name] MD Entered By: [name] on 04/15/2017 09:27:58 [name] ([id number]) -------------------------------------------------------------------------------- Physical Exam Details Patient Name: BRADD, MERLOS. Date of Service: 04/15/2017 8:45 AM Medical Record Patient Account Number: [id number] Number: Treating RN: Ahmed Prima 04/08/1961 (56 y.o. Other Clinician: Date of Birth/Sex: Male) Treating ROBSON, MICHAEL Primary Care Provider: Lorelee Market Provider/Extender: G Referring Provider: Lorelee Market Weeks in Treatment: 1 Constitutional Patient is hypertensive.. Pulse regular and within target range for patient.Marland Kitchen Respirations regular, non-labored and within target range.. Temperature is normal and within the target range for the patient.Marland Kitchen appears in no distress. Cardiovascular Pedal pulses are palpable in the left foot. Notes Exam; the patient has one remaining open wound versus 2 last week. This is at the lateral aspect of his left foot at the base of the left fifth  metatarsal. Area is somewhat boggy and macerated looking. May need to change to an alginate here without wrapping. The wound itself does not look too bad. Using a #3 curet I removed nonviable circumference material including callus, nonviable subcutaneous tissue from around the circumference of the wound and necrotic material from the base of the remaining small wound. Electronic Signature(s) Signed: 04/15/2017 4:50:14 PM By: [name] MD Entered By: [name] on 04/15/2017 09:31:05 Sherian Rein  Wanda Plump ([id number]) -------------------------------------------------------------------------------- Physician Orders Details Patient Name: DEAUNDRE, ALLSTON. Date of Service: 04/15/2017 8:45 AM Medical Record Patient Account Number: [id number] Number: Treating RN: Ahmed Prima 05-24-1961 (56 y.o. Other Clinician: Date of Birth/Sex: Male) Treating ROBSON, MICHAEL Primary Care Provider: Lorelee Market Provider/Extender: G Referring Provider: Vinson Moselle in Treatment: 1 Verbal / Phone Orders: Yes Clinician: Carolyne Fiscal, Debi Read Back and Verified: Yes Diagnosis Coding Wound Cleansing Wound #9 Left,Lateral Foot o Clean wound with wound cleanser. o Cleanse wound with mild soap and water Anesthetic Wound #9 Left,Lateral Foot o Topical Lidocaine 4% cream applied to wound bed prior to debridement Primary Wound Dressing Wound #9 Left,Lateral Foot o Aquacel Ag Secondary Dressing Wound #9 Left,Lateral Foot o Dry Gauze o Conform/Kerlix Dressing Change Frequency Wound #9 Left,Lateral Foot o Change dressing every day. Follow-up Appointments Wound #9 Left,Lateral Foot o Return Appointment in 1 week. Edema Control Wound #9 Left,Lateral Foot o Elevate legs to the level of the heart and pump ankles as often as possible Additional Orders / Instructions Wound #9 Left,Lateral Foot o Increase protein intake. NANDAN, WILLEMS  ([id number]) Electronic Signature(s) Signed: 04/15/2017 4:50:14 PM By: [name] MD Signed: 04/15/2017 5:01:03 PM By: Alric Quan Entered By: Alric Quan on 04/15/2017 09:23:07 [name] ([id number]) -------------------------------------------------------------------------------- Problem List Details Patient Name: SAYVON, ARTERBERRY. Date of Service: 04/15/2017 8:45 AM Medical Record Patient Account Number: [id number] Number: Treating RN: Ahmed Prima 1961-06-23 (56 y.o. Other Clinician: Date of Birth/Sex: Male) Treating ROBSON, MICHAEL Primary Care Provider: Lorelee Market Provider/Extender: G Referring Provider: Lorelee Market Weeks in Treatment: 1 Active Problems ICD-10 Encounter Code Description Active Date Diagnosis E11.621 Type 2 diabetes mellitus with foot ulcer 04/08/2017 Yes L97.521 Non-pressure chronic ulcer of other part of left foot limited 04/08/2017 Yes to breakdown of skin Inactive Problems Resolved Problems Electronic Signature(s) Signed: 04/15/2017 4:50:14 PM By: [name] MD Entered By: [name] on 04/15/2017 09:25:14 [name] ([id number]) -------------------------------------------------------------------------------- Progress Note Details Patient Name: [name]. Date of Service: 04/15/2017 8:45 AM Medical Record Patient Account Number: [id number] Number: Treating RN: Ahmed Prima 02/10/1961 (56 y.o. Other Clinician: Date of Birth/Sex: Male) Treating ROBSON, MICHAEL Primary Care Provider: Lorelee Market Provider/Extender: G Referring Provider: Lorelee Market Weeks in Treatment: 1 Subjective History of Present Illness (HPI) Pleasant 56 year old with history of diabetes (Hgb A1c 10.8 in 2014) and peripheral neuropathy. No PVD. L ABI 1.1. Status post right great toe partial amputation years ago. He was at work and on 10/22/2014, was injured by a cart, and suffered  an ulceration to his left anterior calf. He says that it subsequently became infected, and he was treated with a course of antibiotics. He was found on initial exam to have an ulceration on the dorsum of his left third toe. He was unaware of this and attributes it to pressure from his steel toed boots. More recently he injured his right anterior calf on a cart. Ambulating normally per his baseline. He has been undergoing regular debridements, applying mupirocin cream, and an Ace wrap for edema control. He returns to clinic for follow-up and is without complaints. No pain. No fever or chills. No drainage. 10/25/15; this is a 56 year old man who has type II diabetes with diabetic polyneuropathy. He tells me that he fractured his left fifth metatarsal in June 2016 when he presented with swelling. He does not recall a specific injury. His hemoglobin A1c was apparently too high at the time for consideration of surgery and he was  put in some form of offloading. Ultimately he went to surgery in December with an allograft from his calcaneus to this site, plate and screws. He had an x-ray of the foot in March that showed concerns about nonunion. He tells me that in March he had to move and basically moved himself. He was on his foot a lot and then noticed some drainage from an open area. He has been following with his orthopedic surgeon Dr. Doran Durand. He has been applying a felt donut, dry dressing and using his heel healing sandal. 11/01/15; this is a patient I saw last week for the first time. He had a small open wound on the plantar aspect of his left foot at roughly the level of the base of his fifth metatarsal. He had a considerable degree of thickened skin around this wound on the plantar aspect which I thought was from chronic pressure on this area. He tells [place] that he had drainage over the course of the week. No systemic symptoms. 11/08/15; culture last week grew Citrobacter korseri. This should've been  sensitive to the Augmentin I gave him. He has seen Dr. Doran Durand who did his initial surgery and according the patient the plan is to give this another month and then the hardware might need to come out of this. This seems like a reasonable plan. I will adjust his antibiotics to ciprofloxacin which probably should continue for at least another 2 weeks. I gave him 10 days worth today 11/22/15 the patient has completed antibiotics. He has an appointment with Dr. Doran Durand this Friday. There is improved dimensions around the wound on the left fifth metatarsal base 11/29/15; the patient has completed antibiotics last week. Apparently his appointment with Dr. Doran Durand it is not until this Friday. Dimensions are roughly the same. 12/06/15; saw Dr. Doran Durand. No x-ray told the end of the month, next appointment June 30. We have been using Aquacel Ag 12/13/15: No major change this week. Using Aquacel AG ALEKSANDR, PELLOW. ([id number]) 621/17; arrived this week with maceration around the wound. There was quite a bit of undermining which required surgical debridement. I changed him to [facility] last week, by the patient's admission he was up on this more this week 12/27/15; macerated tissue around the wound is removed with a scalpel and pickups. There is no undermining. Nonviable subcutaneous tissue and skin taken from the superior circumference of the wound is slough from the surface. READMISSION 03/06/16 since I last saw this patient at the end of June, he went for surgery on 01/11/16 by Dr. Doran Durand of orthopedics. He had a left foot irrigation and debridement, removal of hardware and placement of wound VAC. He is also been followed by Dr. Megan Salon of infectious disease and completed a six-week course of IV Rocephin for group B strep and Enterobacter in the bone at the time of surgery. Apparently at the time of surgery the bone looked healthy so I don't think any bone was actually removed. He has been using  silver alginate based dressings on the same wound area at the base of the left fifth metatarsal on the left. I note that he is also had arterial studies on 01/08/16, these showed a left ABI of 1.2 to and a right ABI of 1.3. Waveforms were listed as biphasic. He was not felt to have any specific arterial issues. 03/13/16; no real change in the condition of the wound at the left lateral foot at roughly the base of his fifth metatarsal. Use silver alginate  last week. 03/18/16 arrives today with no open area. Being suspicious of the overlying callus I. Some of this back although I see nothing but covering tissue here/epithelium. There is no surrounding tenderness READMISSION 04/16/16 this is a patient I discharged about a month ago. Initially a surgical wound on the lateral aspect of his left foot which subsequently became infected. The story he is giving today that he went back into his own modified shoe started to notice pain 2 weeks ago he was seen in Dr. Nona Dell office by a physician assistant last Wednesday and according the patient was told that everything looked fine however this is clearly now broken open and he has an open wound in the same spot that we have been dealing with repetitively. Situation is complicated by the fact that he is running short of money on long-term disability. He has not taken his insulin and at least 2 weeks was previously on NovoLog short acting insulin on a sliding scale and TRESIBA 35 units at bedtime. He is no longer able to afford any of his medication he was in the x-ray on 10/15. A plain x-ray showed healed fracture of the left fifth metatarsal bone status post removal of the associated plate and screw fixation hardware. There was no acute appearing osseous abnormality. His blood work showed a white count of 9.2 with an essentially normal differential comprehensive metabolic panel was normal. Previous CT scan of the foot on 01/08/16 showed no osteomyelitis  previous vascular workup showed no evidence of significant PAD on 01/13/16 04/23/16; culture I did last week grew enterococcus [ampicillin sensitive] and MRSA. He saw infectious disease yesterday. They stop the clindamycin and ordered an MRI. This is not unreasonable. All the hardware is out of the foot at this point. 04/30/16 at this point in time we are still awaiting the results of the MRI at this point in time. Patient did have an area which appears to be somewhat macerated in the proximal portion of the wound where there is overlying necrotic skin that is doing nothing more than trapping fluid underneath. He continues to state that he does have some discomfort and again the concern is for the possibility of osteomyelitis hence the reason for the MRI order. We have been using a silver alginate dressing but again I think the reason this with his macerated as it was is that the dressing obscene could not reach the entirety of the wound due to some necrotic skin covering the proximal portion. No bleeding noted at this point in time on initial evaluation. 05/07/16 we receive the results of patient's MRI which shows that he has no evidence of osteomyelitis. This obviously is excellent news. Patient was definitely happy to hear this. He tells me the wound appears to be doing very well at this point in time and is pleased with the progress currently. YAMA, NIELSON ([id number]) 05/15/16; as noted the patient did not have osteomyelitis. He has been released by infectious disease and orthopedics. His wound is still open he had a debridement last week but complain when he got home he "bleeding for half a day". He is not had any pain. We have been using silver alginate with Kerlix gauze wrap. 05/28/16; patient arrives today with the wound in much the same condition as last time. He has a small opening on the lateral aspect of his foot however with debridement there is clear undermining medially there is  no real evidence of infection here and I didn't see any  point in culturing this. One would have to wonder if this isn't a simple matter of in adequate offloading as he is been using a healing sandal. 06/05/16; the open area is now on the plantar aspect of his foot and not decide. The wound almost appears to have "migrated". This was the term use by our intake nurse. 06/12/16; open area on the plantar aspect of his foot. Base of the wound looks very healthy. This will be his second week in a total contact cast 06/19/16; patient arrived today in a total contact cast. There was some expectation from our staff and myself that this area would be healed. Unfortunately the area was boggy and with rec pressure a fairly substantial amount of purulent drainage was obtained. Specimen obtained for culture. The patient had no complaints of systemic problems including fever or chills or instability of his diabetes. There was no pain in the foot. Nevertheless a extensive debridement was required. 06/26/16; patient's culture from the abscess last week grew a combination of MRSA and ampicillin sensitive enterococcus. I had him on Augmentin and Septra however I have elected to give him a full 10 day course of Zyvox instead as I Recently treated this combination of organisms with Augmentin and Septra before. Arrives today with no systemic symptoms 07/03/16; the patient has 2 more treatments of Zyvox and then he is finished antibiotics the wound has improved now mostly on the lateral aspect of his foot. There is still some tenderness when he walks. 07/10/16; patient arrives today with Zyvox completed. He only has a small open area remaining. 07/24/16; she arrives here with no overt open area. The covering is thick callus/eschar. Nevertheless there is no open area here. He has some tenderness underneath the area but no overt infection is observed no drainage. The patient has a deformity in the foot with this area weekly  to be exposed to more pressure in his foot where nevertheless that something we are going to have to deal with going forward. The patient has diabetic workboots and diabetic shoes. He has had a long difficult course with the area here. This started as a fracture at work. He had bone grafting from his calcaneus and screws. This got infected he had to have more surgery on the area. Bone at the time of that surgery I think showed enterococcus and group B strep. He had 6 weeks of Rocephin. Since then the area has waxed and waned in its difficulty. Recently he had an MRI in December that did not show osteomyelitis nevertheless he had an abscess that grew MRSA and enterococcus which I elected to treat with Zyvox. This was in December and this wound is actually "healed" over 08/21/16; the patient came in today for his one-month follow-up visit. The area on the lateral aspect of his left foot looks much the same as some month ago. There is no evidence of an open wound here. However the patient tells me about a week after he went back to work he developed severe pain and swelling in the plantar aspect of his right foot first and fifth metatarsal heads. 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 He went to see his podiatrist at [facility]. She told him that he could not work on his feet. She told him to go back and his cam walking boot on the left. Not have open wounds obviously on the right. The  patient is actually gone ahead and retired from his job because he does not feel he can work on his feet 09/18/16; patient comes back in saying he recently had some pain on the lateral aspect of his left foot. Asked that we look at this. Other than that all of his wounds have a viable surface. He has diabetic shoes. He is retired from work. READMISSION Dezmond Downie is a 56 year old man who we cared for for a prolonged period of  time in late 2017 into March 2018. At that point he had suffered a fracture of his left fifth metatarsal at about the level of the base of the CARROLL, LINGELBACH. ([id number]) fifth metatarsal. He had surgical repair by Dr. Meryl Dare and he developed a very refractory wound over the fifth metatarsal. Ultimately this became infected he required hardware removal this eventually closed over and we discharged from the clinic in March of this year. He tells me as well until roughly 3 weeks ago when he developed increasing pain in the same area making it difficult for him to sleep. He was admitted to hospital from 9/5 through 9/7 with now an ulcer in the same area on the lateral aspect of his left foot at the level of the base of the metatarsal. X-rays and MRIs during this hospitalization were negative. Wound culture showed a combination of Escherichia coli, Morganella and Pseudomonas. He was given IV Cipro and vancomycin the hospital and then discharged home on Cipro and Doxy. He returned to the ER on 9/16 with leg swelling and discomfort. I don't think anything was added at that point although he did have an ultrasound of the left leg that was negative for DVT. He was back in the ER on 10/4 again with pain in the area he was given Bactrim but states he had a significant allergic reaction to that which has abated once he stopped the Bactrim. I don't think he had a full course. He went to see his podiatrist yesterday who recommended some form of foot soak. He has a Darco forefoot offloading boot The patient is a type II diabetic on insulin. Tells me his recent hemoglobin A1c was 7. Arterial studies done in July 2017 showed an ABI in the right of 1.3 on the left 1.2 to biphasic waveforms bilaterally. He was not felt to have arterial disease. As noted the patient has had 2 MRIs most recently on 10/4 this showed no evidence of an abscess or osteomyelitis and no change since the prior study of 03/05/17 there  was nonspecific edema on the dorsum of the foot. ABIs in this clinic today 1.2 which would be unchanged from his study done in July/17 04/15/17; now down to 1 small wound on the left lateral foot. We wrapped him and applied collagen last week and the foot is fairly macerated. Objective Constitutional Patient is hypertensive.. Pulse regular and within target range for patient.Marland Kitchen Respirations regular, non-labored and within target range.. Temperature is normal and within the target range for the patient.Marland Kitchen appears in no distress. Vitals Time Taken: 8:54 AM, Height: 70 in, Weight: 272 lbs, BMI: 39, Temperature: 97.6 F, Pulse: 93 bpm, Respiratory Rate: 18 breaths/min, Blood Pressure: 146/77 mmHg. Cardiovascular Pedal pulses are palpable in the left foot. General Notes: Exam; the patient has one remaining open wound versus 2 last week. This is at the lateral aspect of his left foot at the base of the left fifth metatarsal. Area is somewhat boggy and macerated looking. May need to change  to an alginate here without wrapping. The wound itself does not look too bad. SATOSHI, KALAS ([id number]) Using a #3 curet I removed nonviable circumference material including callus, nonviable subcutaneous tissue from around the circumference of the wound and necrotic material from the base of the remaining small wound. Integumentary (Hair, Skin) Wound #9 status is Open. Original cause of wound was Gradually Appeared. The wound is located on the Left,Lateral Foot. The wound measures 1cm length x 0.5cm width x 0.3cm depth; 0.393cm^2 area and 0.118cm^3 volume. There is no tunneling noted, however, there is undermining starting at 12:00 and ending at 6:00 with a maximum distance of 0.5cm. There is a large amount of serous drainage noted. The wound margin is distinct with the outline attached to the wound base. There is no granulation within the wound bed. There is a large (67-100%) amount of necrotic tissue  within the wound bed including Eschar and Adherent Slough. The periwound skin appearance exhibited: Maceration. Periwound temperature was noted as No Abnormality. The periwound has tenderness on palpation. 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 There was a Skin/Subcutaneous Tissue Debridement ([id number]) debridement with total area of 0.5 sq cm performed by Ricard Dillon, MD. with the following instrument(s): Curette to remove Viable and Non-Viable tissue/material including Exudate, Fibrin/Slough, and Subcutaneous after achieving pain control using Lidocaine 4% Topical Solution. A time out was conducted at 09:17, prior to the start of the procedure. A Minimum amount of bleeding was controlled with Pressure. The procedure was tolerated well with a pain level of 0 throughout and a pain level of 0 following the procedure. Post Debridement Measurements: 1cm length x 0.6cm width x 0.3cm depth; 0.141cm^3 volume. Character of Wound/Ulcer Post Debridement requires further debridement. Severity of Tissue Post Debridement is: Fat layer exposed. Post procedure Diagnosis Wound #9: Same as Pre-Procedure ARISTIDES, LUCKEY ([id number]) Plan Wound Cleansing: Wound #9 Left,Lateral Foot: Clean wound with wound cleanser. Cleanse wound with mild soap and water Anesthetic: Wound #9 Left,Lateral Foot: Topical Lidocaine 4% cream applied to wound bed prior to debridement Primary Wound Dressing: Wound #9 Left,Lateral Foot: Aquacel Ag Secondary Dressing: Wound #9 Left,Lateral Foot: Dry Gauze Conform/Kerlix Dressing Change Frequency: Wound #9 Left,Lateral Foot: Change  dressing every day. Follow-up Appointments: Wound #9 Left,Lateral Foot: Return Appointment in 1 week. Edema Control: Wound #9 Left,Lateral Foot: Elevate legs to the level of the heart and pump ankles as often as possible Additional Orders / Instructions: Wound #9 Left,Lateral Foot: Increase protein intake. #1 change the primary dressing the silver alginate that he can change daily himself #2 he has some form of foot soaks sent to him by his podiatrist which I think can be done before he reapplies the dressing #3 he is asking me about disability. I told him that I don't make any judgment about disability determination as it doesn't seem to consistently follow any particular set of medical guidelines that I am aware of. He reminds me that every time he gets up and tries to stand on his foot for any period of time this area opens. Electronic Signature(s) Signed: 04/15/2017 4:50:14 PM By: [name] MD Entered By: [name] on 04/15/2017 09:32:32 [name] ([id number]) Kipp Brood,  CRISTAN SCHERZER ([id number]) -------------------------------------------------------------------------------- [place] Details Patient Name: YONA, STANSBURY. Date of Service: 04/15/2017 Medical Record Patient Account Number: [id number] Number: Treating RN: Ahmed Prima 1961/03/27 (56 y.o. Other Clinician: Date of Birth/Sex: Male) Treating ROBSON, Mather Primary Care Provider: Lorelee Market Provider/Extender: G Referring Provider: Lorelee Market Weeks in Treatment: 1 Diagnosis Coding ICD-10 Codes Code Description E11.621 Type 2 diabetes mellitus with foot ulcer L97.521 Non-pressure chronic ulcer of other part of left foot limited to breakdown of skin Facility Procedures CPT4: Description Modifier Quantity Code [id number] 11042 - DEB SUBQ TISSUE 20 SQ CM/< 1 ICD-10 Description Diagnosis L97.521 Non-pressure chronic ulcer of other part of left foot limited to breakdown of  skin Physician Procedures CPT4: Description Modifier Quantity Code [id number] 06004 - WC PHYS SUBQ TISS 20 SQ CM 1 ICD-10 Description Diagnosis L97.521 Non-pressure chronic ulcer of other part of left foot limited to breakdown of skin Electronic Signature(s) Signed: 04/15/2017 4:50:14 PM By: [name] MD Entered By: [name] on 04/15/2017 09:32:58

## 2017-04-17 NOTE — Progress Notes (Signed)
DMETRIUS, AMBS ([id number]) Visit Report for 04/15/2017 Arrival Information Details Patient Name: [name], [name]. Date of Service: 04/15/2017 8:45 AM Medical Record Number: [id number] Patient Account Number: [id number] Date of Birth/Sex: [birth year] (56 y.o. Male) Treating RN: Ahmed Prima Primary Care Tonette Koehne: Lorelee Market Other Clinician: Referring Belvin Gauss: Lorelee Market Treating Alvis Edgell/Extender: Tito Dine in Treatment: 1 Visit Information History Since Last Visit All ordered tests and consults were completed: No Patient Arrived: Ambulatory Added or deleted any medications: No Arrival Time: 08:52 Any new allergies or adverse reactions: No Accompanied By: self Had a fall or experienced change in No Transfer Assistance: None activities of daily living that may affect Patient Identification Verified: Yes risk of falls: Secondary Verification Process Yes Signs or symptoms of abuse/neglect since last No Completed: visito Patient Requires Transmission-Based No Hospitalized since last visit: No Precautions: Has Dressing in Place as Prescribed: Yes Patient Has Alerts: Yes Has Compression in Place as Prescribed: Yes Patient Alerts: DM II Pain Present Now: No Electronic Signature(s) Signed: 04/15/2017 5:01:03 PM By: Alric Quan Entered By: Alric Quan on 04/15/2017 08:54:39 [name] ([id number]) -------------------------------------------------------------------------------- Encounter Discharge Information Details Patient Name: [name]. Date of Service: 04/15/2017 8:45 AM Medical Record Number: [id number] Patient Account Number: [id number] Date of Birth/Sex: 05-12-1961 (56 y.o. Male) Treating RN: Ahmed Prima Primary Care Chakira Jachim: Lorelee Market Other Clinician: Referring Nahlia Hellmann: Lorelee Market Treating Gregoire Bennis/Extender: Tito Dine in Treatment: 1 Encounter Discharge Information  Items Discharge Pain Level: 0 Discharge Condition: Stable Ambulatory Status: Ambulatory Discharge Destination: Home Transportation: Private Auto Accompanied By: self Schedule Follow-up Appointment: Yes Medication Reconciliation completed No and provided to Patient/Care Toussaint Golson: Patient Clinical Summary of Care: Declined Electronic Signature(s) Signed: 04/16/2017 3:48:01 PM By: Ruthine Dose Entered By: Ruthine Dose on 04/15/2017 09:30:02 [name] ([id number]) -------------------------------------------------------------------------------- Lower Extremity Assessment Details Patient Name: [name]. Date of Service: 04/15/2017 8:45 AM Medical Record Number: [id number] Patient Account Number: [id number] Date of Birth/Sex: Nov 05, 1960 (56 y.o. Male) Treating RN: Ahmed Prima Primary Care Rogina Schiano: Lorelee Market Other Clinician: Referring Robbi Scurlock: Lorelee Market Treating Takota Cahalan/Extender: Ricard Dillon Weeks in Treatment: 1 Edema Assessment Assessed: [Left: No] [Right: No] E[Left: dema] [Right: :] Calf Left: Right: Point of Measurement: 36 cm From Medial Instep 41.5 cm cm Ankle Left: Right: Point of Measurement: 9 cm From Medial Instep 23.3 cm cm Vascular Assessment Pulses: Dorsalis Pedis Palpable: [Left:No] Doppler Audible: [Left:Yes] Posterior Tibial Extremity colors, hair growth, and conditions: Extremity Color: [Left:Hyperpigmented] Temperature of Extremity: [Left:Warm] Capillary Refill: [Left:< 3 seconds] Toe Nail Assessment Left: Right: Thick: Yes Discolored: Yes Deformed: No Improper Length and Hygiene: No Electronic Signature(s) Signed: 04/15/2017 5:01:03 PM By: Alric Quan Entered By: Alric Quan on 04/15/2017 09:05:57 [name] ([id number]) -------------------------------------------------------------------------------- Multi Wound Chart Details Patient Name: [name]. Date of Service: 04/15/2017  8:45 AM Medical Record Number: [id number] Patient Account Number: [id number] Date of Birth/Sex: 1961/02/09 (56 y.o. 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] [N/A:N/A] Comorbid History: [9:Hypertension, Type II Diabetes, Neuropathy] [N/A:N/A] Date Acquired: [9:03/18/2017] [N/A:N/A] Weeks of Treatment: [9:1] [N/A:N/A] Wound Status: [9:Open] [N/A:N/A] Measurements L x W x D  1x0.5x0.3 [N/A:N/A] (cm) Area (cm) : [9:0.393] [N/A:N/A] Volume (cm) : [9:0.118] [N/A:N/A] % Reduction in Area: [9:37.40%] [N/A:N/A] % Reduction in Volume: 6.30% [N/A:N/A] Starting Position 1 12 (o'clock): Ending Position 1 [9:6] (o'clock): Maximum Distance 1 0.5 (cm): Undermining: [9:Yes] [N/A:N/A] Classification: [9:Grade 1] [N/A:N/A] Exudate Amount: [9:Large] [N/A:N/A] Exudate Type: [9:Serous] [N/A:N/A] Exudate Color: [9:amber] [N/A:N/A] Wound Margin: [9:Distinct, outline attached] [N/A:N/A] Granulation Amount: [9:None Present (0%)] [N/A:N/A] Necrotic Amount: [9:Large (67-100%)] [N/A:N/A] Necrotic Tissue: Eschar, Adherent Slough N/A N/A Epithelialization: None N/A N/A Debridement: Debridement (14970- N/A N/A 11047) Pre-procedure 09:17 N/A N/A Verification/Time Out Taken: Pain Control: Lidocaine 4% Topical N/A N/A Solution Tissue Debrided: Fibrin/Slough, Exudates, N/A N/A Subcutaneous Level: Skin/Subcutaneous N/A N/A Tissue Debridement Area (sq 0.5 N/A  N/A cm): Instrument: Curette N/A N/A Bleeding: Minimum N/A N/A Hemostasis Achieved: Pressure N/A N/A Procedural Pain: 0 N/A N/A Post Procedural Pain: 0 N/A N/A Debridement Treatment Procedure was tolerated N/A N/A Response: well Post Debridement 1x0.6x0.3 N/A N/A Measurements L x W x D (cm) Post Debridement 0.141 N/A N/A Volume: (cm) Periwound Skin Texture: No Abnormalities Noted N/A N/A Periwound Skin Maceration: Yes N/A N/A Moisture: Periwound Skin Color: No Abnormalities Noted N/A N/A Temperature: No Abnormality N/A N/A Tenderness on Yes N/A N/A Palpation: Wound Preparation: Ulcer Cleansing: N/A N/A Rinsed/Irrigated with Saline Topical Anesthetic Applied: Other: lidocaine 4% Procedures Performed: Debridement N/A N/A Treatment Notes Electronic Signature(s) Signed: 04/15/2017 4:50:14 PM By: [name] MD Entered By: [name] on 04/15/2017 09:25:41 [name], [name] ([id number]) [name] ([id number]) -------------------------------------------------------------------------------- [place] Details Patient Name: [name], [name]. Date of Service: 04/15/2017 8:45 AM Medical Record Number: [id number] Patient Account Number: [id number] Date of Birth/Sex: 1961/04/10 (56 y.o. Male) Treating RN: Ahmed Prima Primary Care Avayah Raffety: Lorelee Market Other Clinician: Referring Stephen Turnbaugh: Lorelee Market Treating Jovie Swanner/Extender: Tito Dine in Treatment: 1 Active Inactive ` Abuse / Safety / Falls / Self Care Management Nursing Diagnoses: History of Falls Goals: Patient will not experience any injury related to falls Date Initiated: 04/08/2017 Target Resolution Date: 08/09/2017 Goal Status: Active Interventions: Assess fall risk on admission and as needed Notes: ` Nutrition Nursing Diagnoses: Imbalanced nutrition Impaired glucose control: actual or potential Potential for alteratiion in Nutrition/Potential for  imbalanced nutrition Goals: Patient/caregiver agrees to and verbalizes understanding of need to use nutritional supplements and/or vitamins as prescribed Date Initiated: 04/08/2017 Target Resolution Date: 08/09/2017 Goal Status: Active Patient/caregiver will maintain therapeutic glucose control Date Initiated: 04/08/2017 Target Resolution Date: 08/09/2017 Goal Status: Active Interventions: Assess patient nutrition upon admission and as needed per policy Provide education on elevated blood sugars and impact on wound healing Provide education on nutrition [name], [name] ([id number]) Treatment Activities: Education provided on Nutrition : 04/08/2017 Notes: ` Orientation to the Wound Care Program Nursing Diagnoses: Knowledge deficit related to the wound healing center program Goals: Patient/caregiver will verbalize understanding of the [facility] Program Date Initiated: 04/08/2017 Target Resolution Date: 05/10/2017 Goal Status: Active Interventions: Provide education on orientation to the wound center Notes: ` Pain, Acute or Chronic Nursing Diagnoses: Pain, acute or chronic: actual or potential Potential alteration in comfort, pain Goals: Patient/caregiver will verbalize adequate pain control between visits Date Initiated: 04/08/2017 Target Resolution Date: 08/09/2017 Goal Status: Active Interventions: Complete pain assessment as per visit requirements Notes: ` Wound/Skin Impairment Nursing Diagnoses: Impaired tissue integrity Knowledge deficit related to ulceration/compromised skin integrity Goals: Ulcer/skin breakdown will have a volume reduction of 80% by week 12 [name], [name] ([id number]) Date Initiated: 04/08/2017 Target Resolution Date:  08/02/2017 Goal Status: Active Interventions: Assess patient/caregiver ability to perform ulcer/skin care regimen upon admission and as needed Assess ulceration(s) every visit Notes: Electronic Signature(s) Signed:  04/15/2017 5:01:03 PM By: Alric Quan Entered By: Alric Quan on 04/15/2017 09:16:16 [name] ([id number]) -------------------------------------------------------------------------------- Pain Assessment Details Patient Name: [name]. Date of Service: 04/15/2017 8:45 AM Medical Record Number: [id number] Patient Account Number: [id number] Date of Birth/Sex: 1960/07/29 (56 y.o. Male) Treating RN: Ahmed Prima Primary Care Tarius Stangelo: Lorelee Market Other Clinician: Referring Christalynn Boise: Lorelee Market Treating Trinia Georgi/Extender: Ricard Dillon Weeks in Treatment: 1 Active Problems Location of Pain Severity and Description of Pain Patient Has Paino No Site Locations Pain Management and Medication Current Pain Management: Electronic Signature(s) Signed: 04/15/2017 5:01:03 PM By: Alric Quan Entered By: Alric Quan on 04/15/2017 08:54:44 [name] ([id number]) -------------------------------------------------------------------------------- Patient/Caregiver Education Details Patient Name: [name]. Date of Service: 04/15/2017 8:45 AM Medical Record Patient Account Number: [id number] Number: Treating RN: Ahmed Prima August 03, 1960 (56 y.o. Other Clinician: Date of Birth/Gender: Male) Treating ROBSON, [place] Primary Care Physician: Lorelee Market Physician/Extender: G Referring Physician: Vinson Moselle in Treatment: 1 Education Assessment Education Provided To: Patient Education Topics Provided Wound/Skin Impairment: Handouts: Other: change dressing as ordered Methods: Demonstration, Explain/Verbal Responses: State content correctly Electronic Signature(s) Signed: 04/15/2017 5:01:03 PM By: Alric Quan Entered By: Alric Quan on 04/15/2017 09:17:53 [name] ([id number]) -------------------------------------------------------------------------------- Wound Assessment  Details Patient Name: [name]. Date of Service: 04/15/2017 8:45 AM Medical Record Number: [id number] Patient Account Number: [id number] Date of Birth/Sex: 10/22/1960 (56 y.o. Male) Treating RN: Ahmed Prima Primary Care Ryka Beighley: Lorelee Market Other Clinician: Referring Jentzen Minasyan: Lorelee Market Treating Anakin Varkey/Extender: Ricard Dillon Weeks in Treatment: 1 Wound Status Wound Number: 9 Primary Diabetic Wound/Ulcer of the Lower Etiology: Extremity Wound Location: Left Foot - Lateral Wound Status: Open Wounding Event: Gradually Appeared Comorbid Hypertension, Type II Diabetes, Date Acquired: 03/18/2017 History: Neuropathy Weeks Of Treatment: 1 Clustered Wound: No Photos Photo Uploaded By: Alric Quan on 04/15/2017 16:47:09 Wound Measurements Length: (cm) 1 Width: (cm) 0.5 Depth: (cm) 0.3 Area: (cm) 0.393 Volume: (cm) 0.118 % Reduction in Area: 37.4% % Reduction in Volume: 6.3% Epithelialization: None Tunneling: No Undermining: Yes Starting Position (o'clock): 12 Ending Position (o'clock): 6 Maximum Distance: (cm) 0.5 Wound Description Classification: Grade 1 Wound Margin: Distinct, outline attached Exudate Amount: Large Exudate Type: Serous Exudate Color: amber Foul Odor After Cleansing: No Slough/Fibrino Yes Wound Bed [name], [name] ([id number]) Granulation Amount: None Present (0%) Necrotic Amount: Large (67-100%) Necrotic Quality: Eschar, Adherent Slough Periwound Skin Texture Texture Color No Abnormalities Noted: No No Abnormalities Noted: No Moisture Temperature / Pain No Abnormalities Noted: No Temperature: No Abnormality Maceration: Yes Tenderness on Palpation: Yes Wound Preparation Ulcer Cleansing: Rinsed/Irrigated with Saline Topical Anesthetic Applied: Other: lidocaine 4%, Treatment Notes Wound #9 (Left, Lateral Foot) 1. Cleansed with: Clean wound with Normal Saline 2. Anesthetic Topical Lidocaine 4% cream to  wound bed prior to debridement 4. Dressing Applied: Aquacel Ag 5. Secondary Dressing Applied ABD Pad Dry Gauze Kerlix/Conform 7. Secured with Recruitment consultant) Signed: 04/15/2017 5:01:03 PM By: Alric Quan Entered By: Alric Quan on 04/15/2017 09:03:24 [name] ([id number]) -------------------------------------------------------------------------------- [place] Details Patient Name: [name]. Date of Service: 04/15/2017 8:45 AM Medical Record Number: [id number] Patient Account Number: [id number] Date of Birth/Sex: 10/25/1960 (56 y.o. Male) Treating RN: Ahmed Prima Primary Care Toini Failla: Lorelee Market Other Clinician: Referring Shem Plemmons: Lorelee Market Treating Gearldine Looney/Extender: Ricard Dillon Weeks in Treatment: 1 Vital  Signs Time Taken: 08:54 Temperature (F): 97.6 Height (in): 70 Pulse (bpm): 93 Weight (lbs): 272 Respiratory Rate (breaths/min): 18 Body Mass Index (BMI): 39 Blood Pressure (mmHg): 146/77 Reference Range: 80 - 120 mg / dl Electronic Signature(s) Signed: 04/15/2017 5:01:03 PM By: Alric Quan Entered By: Alric Quan on 04/15/2017 08:56:14

## 2017-04-22 ENCOUNTER — Encounter: Payer: Commercial Managed Care - PPO | Admitting: Internal Medicine

## 2017-04-22 DIAGNOSIS — E11621 Type 2 diabetes mellitus with foot ulcer: Secondary | ICD-10-CM | POA: Diagnosis not present

## 2017-04-24 NOTE — Progress Notes (Signed)
[name], [name] ([id number]) Visit Report for 04/22/2017 Debridement Details Patient Name: [name], [name]. Date of Service: 04/22/2017 8:45 AM Medical Record Number: [id number] Patient Account Number: [id number] Date of Birth/Sex: 1960/12/20 (56 y.o. Male) Treating RN: Ahmed Prima Primary Care Provider: Lorelee Market Other Clinician: Referring Provider: Lorelee Market Treating Provider/Extender: Ricard Dillon Weeks in Treatment: 2 Debridement Performed for Wound #9 Left,Lateral Foot Assessment: Performed By: Physician Ricard Dillon, MD Debridement: Debridement Severity of Tissue Pre Fat layer exposed Debridement: Pre-procedure Verification/Time Yes - 09:08 Out Taken: Start Time: 09:09 Pain Control: Lidocaine 4% Topical Solution Level: Skin/Subcutaneous Tissue Total Area Debrided (L x W): 0.2 (cm) x 0.1 (cm) = 0.02 (cm) Tissue and other material Viable, Non-Viable, Exudate, Fibrin/Slough, Subcutaneous debrided: Instrument: Blade, Forceps Bleeding: Moderate Hemostasis Achieved: Silver Nitrate End Time: 09:12 Procedural Pain: 0 Post Procedural Pain: 0 Response to Treatment: Procedure was tolerated well Post Debridement Measurements of Total Wound Length: (cm) 1 Width: (cm) 0.5 Depth: (cm) 0.3 Volume: (cm) 0.118 Character of Wound/Ulcer Post Debridement: Requires Further Debridement Severity of Tissue Post Debridement: Fat layer exposed Post Procedure Diagnosis Same as Pre-procedure Electronic Signature(s) Signed: 04/22/2017 4:56:05 PM By: [name] MD Signed: 04/22/2017 4:58:47 PM By: Alric Quan Entered By: Alric Quan on 04/22/2017 09:14:14 [name] ([id number]) -------------------------------------------------------------------------------- HPI Details Patient Name: [name], [name]. Date of Service: 04/22/2017 8:45 AM Medical Record Number: [id number] Patient Account Number: [id number] Date of Birth/Sex: [birth year]  (56 y.o. Male) Treating RN: Ahmed Prima Primary Care Provider: Lorelee Market Other Clinician: Referring Provider: Lorelee Market Treating Provider/Extender: Ricard Dillon Weeks in Treatment: 2 History of Present Illness HPI Description: Pleasant 56 year old with history of diabetes (Hgb A1c 10.8 in 2014) and peripheral neuropathy. No PVD. L ABI 1.1. Status post right great toe partial amputation years ago. He was at work and on 10/22/2014, was injured by a cart, and suffered an ulceration to his left anterior calf. He says that it subsequently became infected, and he was treated with a course of antibiotics. He was found on initial exam to have an ulceration on the dorsum of his left third toe. He was unaware of this and attributes it to pressure from his steel toed boots. More recently he injured his right anterior calf on a cart. Ambulating normally per his baseline. He has been undergoing regular debridements, applying mupirocin cream, and an Ace wrap for edema control. He returns to clinic for follow-up and is without complaints. No pain. No fever or chills. No drainage. 10/25/15; this is a 56 year old man who has type II diabetes with diabetic polyneuropathy. He tells me that he fractured his left fifth metatarsal in June 2016 when he presented with swelling. He does not recall a specific injury. His hemoglobin A1c was apparently too high at the time for consideration of surgery and he was put in some form of offloading. Ultimately he went to surgery in December with an allograft from his calcaneus to this site, plate and screws. He had an x-ray of the foot in March that showed concerns about nonunion. He tells me that in March he had to move and basically moved himself. He was on his foot a lot and then noticed some drainage from an open area. He has been following with his orthopedic surgeon Dr. Doran Durand. He has been applying a felt donut, dry dressing and using his heel  healing sandal. 11/01/15; this is a patient I saw last week for the first time. He had a small open wound  on the plantar aspect of his left foot at roughly the level of the base of his fifth metatarsal. He had a considerable degree of thickened skin around this wound on the plantar aspect which I thought was from chronic pressure on this area. He tells [place] that he had drainage over the course of the week. No systemic symptoms. 11/08/15; culture last week grew Citrobacter korseri. This should've been sensitive to the Augmentin I gave him. He has seen Dr. Doran Durand who did his initial surgery and according the patient the plan is to give this another month and then the hardware might need to come out of this. This seems like a reasonable plan. I will adjust his antibiotics to ciprofloxacin which probably should continue for at least another 2 weeks. I gave him 10 days worth today 11/22/15 the patient has completed antibiotics. He has an appointment with Dr. Doran Durand this Friday. There is improved dimensions around the wound on the left fifth metatarsal base 11/29/15; the patient has completed antibiotics last week. Apparently his appointment with Dr. Doran Durand it is not until this Friday. Dimensions are roughly the same. 12/06/15; saw Dr. Doran Durand. No x-ray told the end of the month, next appointment June 30. We have been using Aquacel Ag 12/13/15: No major change this week. Using Aquacel AG 621/17; arrived this week with maceration around the wound. There was quite a bit of undermining which required surgical debridement. I changed him to [facility] last week, by the patient's admission he was up on this more this week 12/27/15; macerated tissue around the wound is removed with a scalpel and pickups. There is no undermining. Nonviable subcutaneous tissue and skin taken from the superior circumference of the wound is slough from the surface. READMISSION 03/06/16 since I last saw this patient at the end of June, he  went for surgery on 01/11/16 by Dr. Doran Durand of orthopedics. He had a left foot irrigation and debridement, removal of hardware and placement of wound VAC. He is also been followed by Dr. Megan Salon of infectious disease and completed a six-week course of IV Rocephin for group B strep and Enterobacter in the bone at the time of surgery. Apparently at the time of surgery the bone looked healthy so I don't think any bone was actually removed. He has been using silver alginate based dressings on the same wound area at the base of the left fifth metatarsal on the left. I note that he is also had arterial studies on 01/08/16, these showed a left ABI of 1.2 to and a right ABI of 1.3. Waveforms were listed as biphasic. He was not felt to have any specific arterial issues. 03/13/16; no real change in the condition of the wound at the left lateral foot at roughly the base of his fifth metatarsal. Use silver alginate last week. [name], [name] ([id number]) 03/18/16 arrives today with no open area. Being suspicious of the overlying callus I. Some of this back although I see nothing but covering tissue here/epithelium. There is no surrounding tenderness READMISSION 04/16/16 this is a patient I discharged about a month ago. Initially a surgical wound on the lateral aspect of his left foot which subsequently became infected. The story he is giving today that he went back into his own modified shoe started to notice pain 2 weeks ago he was seen in Dr. Nona Dell office by a physician assistant last Wednesday and according the patient was told that everything looked fine however this is clearly now broken open and  he has an open wound in the same spot that we have been dealing with repetitively. Situation is complicated by the fact that he is running short of money on long-term disability. He has not taken his insulin and at least 2 weeks was previously on NovoLog short acting insulin on a sliding scale and TRESIBA 35  units at bedtime. He is no longer able to afford any of his medication he was in the x-ray on 10/15. A plain x-ray showed healed fracture of the left fifth metatarsal bone status post removal of the associated plate and screw fixation hardware. There was no acute appearing osseous abnormality. His blood work showed a white count of 9.2 with an essentially normal differential comprehensive metabolic panel was normal. Previous CT scan of the foot on 01/08/16 showed no osteomyelitis previous vascular workup showed no evidence of significant PAD on 01/13/16 04/23/16; culture I did last week grew enterococcus [ampicillin sensitive] and MRSA. He saw infectious disease yesterday. They stop the clindamycin and ordered an MRI. This is not unreasonable. All the hardware is out of the foot at this point. 04/30/16 at this point in time we are still awaiting the results of the MRI at this point in time. Patient did have an area which appears to be somewhat macerated in the proximal portion of the wound where there is overlying necrotic skin that is doing nothing more than trapping fluid underneath. He continues to state that he does have some discomfort and again the concern is for the possibility of osteomyelitis hence the reason for the MRI order. We have been using a silver alginate dressing but again I think the reason this with his macerated as it was is that the dressing obscene could not reach the entirety of the wound due to some necrotic skin covering the proximal portion. No bleeding noted at this point in time on initial evaluation. 05/07/16 we receive the results of patient's MRI which shows that he has no evidence of osteomyelitis. This obviously is excellent news. Patient was definitely happy to hear this. He tells me the wound appears to be doing very well at this point in time and is pleased with the progress currently. 05/15/16; as noted the patient did not have osteomyelitis. He has been released  by infectious disease and orthopedics. His wound is still open he had a debridement last week but complain when he got home he "bleeding for half a day". He is not had any pain. We have been using silver alginate with Kerlix gauze wrap. 05/28/16; patient arrives today with the wound in much the same condition as last time. He has a small opening on the lateral aspect of his foot however with debridement there is clear undermining medially there is no real evidence of infection here and I didn't see any point in culturing this. One would have to wonder if this isn't a simple matter of in adequate offloading as he is been using a healing sandal. 06/05/16; the open area is now on the plantar aspect of his foot and not decide. The wound almost appears to have "migrated". This was the term use by our intake nurse. 06/12/16; open area on the plantar aspect of his foot. Base of the wound looks very healthy. This will be his second week in a total contact cast 06/19/16; patient arrived today in a total contact cast. There was some expectation from our staff and myself that this area would be healed. Unfortunately the area was boggy and with  rec pressure a fairly substantial amount of purulent drainage was obtained. Specimen obtained for culture. The patient had no complaints of systemic problems including fever or chills or instability of his diabetes. There was no pain in the foot. Nevertheless a extensive debridement was required. 06/26/16; patient's culture from the abscess last week grew a combination of MRSA and ampicillin sensitive enterococcus. I had him on Augmentin and Septra however I have elected to give him a full 10 day course of Zyvox instead as I Recently treated this combination of organisms with Augmentin and Septra before. Arrives today with no systemic symptoms 07/03/16; the patient has 2 more treatments of Zyvox and then he is finished antibiotics the wound has improved now mostly on the  lateral aspect of his foot. There is still some tenderness when he walks. 07/10/16; patient arrives today with Zyvox completed. He only has a small open area remaining. 07/24/16; she arrives here with no overt open area. The covering is thick callus/eschar. Nevertheless there is no open area here. He has some tenderness underneath the area but no overt infection is observed no drainage. The patient has a deformity in the foot with this area weekly to be exposed to more pressure in his foot where nevertheless that something we are going to have to deal with going forward. The patient has diabetic workboots and diabetic shoes. He has had a long difficult course with the area here. This started as a fracture at work. He had bone grafting from his calcaneus and screws. This got infected he had to have more surgery on the area. Bone at the time of that surgery I think showed enterococcus and group B strep. He had 6 weeks of Rocephin. Since then the area has waxed and waned in its difficulty. Recently he had an MRI in December that did not show osteomyelitis nevertheless he had an abscess that grew MRSA and enterococcus which I elected to treat with Zyvox. This was in December and this wound is actually "healed" over [name], [name] ([id number]) 08/21/16; the patient came in today for his one-month follow-up visit. The area on the lateral aspect of his left foot looks much the same as some month ago. There is no evidence of an open wound here. However the patient tells me about a week after he went back to work he developed severe pain and swelling in the plantar aspect of his right foot first and fifth metatarsal heads. 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 He went to see his podiatrist at [facility]. She told him that he could not work on his feet. She told him to go back and his cam walking boot on the left. Not  have open wounds obviously on the right. The patient is actually gone ahead and retired from his job because he does not feel he can work on his feet 09/18/16; patient comes back in saying he recently had some pain on the lateral aspect of his left foot. Asked that we look at this. Other than that all of his wounds have a viable surface. He has diabetic shoes. He is retired from work. READMISSION Advith Martine is a 56 year old man who we cared for for a prolonged period of time in late 2017 into March 2018. At that point he had suffered a fracture of his left fifth metatarsal at about the level of the base of the fifth metatarsal.  He had surgical repair by Dr. Meryl Dare and he developed a very refractory wound over the fifth metatarsal. Ultimately this became infected he required hardware removal this eventually closed over and we discharged from the clinic in March of this year. He tells me as well until roughly 3 weeks ago when he developed increasing pain in the same area making it difficult for him to sleep. He was admitted to hospital from 9/5 through 9/7 with now an ulcer in the same area on the lateral aspect of his left foot at the level of the base of the metatarsal. X-rays and MRIs during this hospitalization were negative. Wound culture showed a combination of Escherichia coli, Morganella and Pseudomonas. He was given IV Cipro and vancomycin the hospital and then discharged home on Cipro and Doxy. He returned to the ER on 9/16 with leg swelling and discomfort. I don't think anything was added at that point although he did have an ultrasound of the left leg that was negative for DVT. He was back in the ER on 10/4 again with pain in the area he was given Bactrim but states he had a significant allergic reaction to that which has abated once he stopped the Bactrim. I don't think he had a full course. He went to see his podiatrist yesterday who recommended some form of foot soak. He has a Darco  forefoot offloading boot The patient is a type II diabetic on insulin. Tells me his recent hemoglobin A1c was 7. Arterial studies done in July 2017 showed an ABI in the right of 1.3 on the left 1.2 to biphasic waveforms bilaterally. He was not felt to have arterial disease. As noted the patient has had 2 MRIs most recently on 10/4 this showed no evidence of an abscess or osteomyelitis and no change since the prior study of 03/05/17 there was nonspecific edema on the dorsum of the foot. ABIs in this clinic today 1.2 which would be unchanged from his study done in July/17 04/15/17; now down to 1 small wound on the left lateral foot. We wrapped him and applied collagen last week and the foot is fairly macerated. 04/22/17; small wound on the left lateral foot however it has some undermining. Using collagen Electronic Signature(s) Signed: 04/22/2017 4:56:05 PM By: [name] MD Entered By: [name] on 04/22/2017 09:45:18 [name] ([id number]) -------------------------------------------------------------------------------- Physical Exam Details Patient Name: [name], [name]. Date of Service: 04/22/2017 8:45 AM Medical Record Number: [id number] Patient Account Number: [id number] Date of Birth/Sex: 1960-08-21 (56 y.o. Male) Treating RN: Ahmed Prima Primary Care Provider: Lorelee Market Other Clinician: Referring Provider: Lorelee Market Treating Provider/Extender: Ricard Dillon Weeks in Treatment: 2 Constitutional Patient is hypertensive.. Pulse regular and within target range for patient.Marland Kitchen Respirations regular, non-labored and within target range.. Temperature is normal and within the target range for the patient.Marland Kitchen appears in no distress. Notes When exam; small open wound however with undermining especially superiorly at 0.4-0.5 cm. Using pickups and scalpel I remove the overhanging skin and subcutaneous tissue. This bleeds very easily making for removal  difficult. Hemostasis with direct pressure and silver nitrate. Electronic Signature(s) Signed: 04/22/2017 4:56:05 PM By: [name] MD Entered By: [name] on 04/22/2017 09:46:33 [name] ([id number]) -------------------------------------------------------------------------------- Physician Orders Details Patient Name: [name], [name]. Date of Service: 04/22/2017 8:45 AM Medical Record Number: [id number] Patient Account Number: [id number] Date of Birth/Sex: [birth year] (56 y.o. Male) Treating RN: Ahmed Prima Primary Care Provider: Lorelee Market Other Clinician: Referring Provider: Lorelee Market  Treating Provider/Extender: Tito Dine in Treatment: 2 Verbal / Phone Orders: Yes Clinician: Pinkerton, Debi Read Back and Verified: Yes Diagnosis Coding Wound Cleansing Wound #9 Left,Lateral Foot o Clean wound with wound cleanser. o Cleanse wound with mild soap and water Anesthetic Wound #9 Left,Lateral Foot o Topical Lidocaine 4% cream applied to wound bed prior to debridement Primary Wound Dressing Wound #9 Left,Lateral Foot o Silvercel Non-Adherent Secondary Dressing Wound #9 Left,Lateral Foot o Dry Gauze o Conform/Kerlix o Foam Dressing Change Frequency Wound #9 Left,Lateral Foot o Change dressing every day. Follow-up Appointments Wound #9 Left,Lateral Foot o Return Appointment in 1 week. Edema Control Wound #9 Left,Lateral Foot o Elevate legs to the level of the heart and pump ankles as often as possible Additional Orders / Instructions Wound #9 Left,Lateral Foot o Increase protein intake. Electronic Signature(s) Signed: 04/22/2017 4:56:05 PM By: [name] MD Signed: 04/22/2017 4:58:47 PM By: Alric Quan Entered By: Alric Quan on 04/22/2017 09:15:17 JAXSUN, CIAMPI ([name]  ([id number]) -------------------------------------------------------------------------------- Problem List Details Patient Name: [name], [name]. Date of Service: 04/22/2017 8:45 AM Medical Record Number: [id number] Patient Account Number: [id number] Date of Birth/Sex: 18-May-1961 (56 y.o. Male) Treating RN: Ahmed Prima Primary Care Provider: Lorelee Market Other Clinician: Referring Provider: Lorelee Market Treating Provider/Extender: Ricard Dillon Weeks in Treatment: 2 Active Problems ICD-10 Encounter Code Description Active Date Diagnosis E11.621 Type 2 diabetes mellitus with foot ulcer 04/08/2017 Yes L97.521 Non-pressure chronic ulcer of other part of left foot limited to 04/08/2017 Yes breakdown of skin Inactive Problems Resolved Problems Electronic Signature(s) Signed: 04/22/2017 4:56:05 PM By: [name] MD Entered By: [name] on 04/22/2017 09:42:58 [name] ([id number]) -------------------------------------------------------------------------------- Progress Note Details Patient Name: [name]. Date of Service: 04/22/2017 8:45 AM Medical Record Number: [id number] Patient Account Number: [id number] Date of Birth/Sex: 1960-09-28 (56 y.o. Male) Treating RN: Ahmed Prima Primary Care Provider: Lorelee Market Other Clinician: Referring Provider: Lorelee Market Treating Provider/Extender: Ricard Dillon Weeks in Treatment: 2 Subjective History of Present Illness (HPI) Pleasant 56 year old with history of diabetes (Hgb A1c 10.8 in 2014) and peripheral neuropathy. No PVD. L ABI 1.1. Status post right great toe partial amputation years ago. He was at work and on 10/22/2014, was injured by a cart, and suffered an ulceration to his left anterior calf. He says that it subsequently became infected, and he was treated with a course of antibiotics. He was found on initial exam to have an ulceration on the dorsum of his left third  toe. He was unaware of this and attributes it to pressure from his steel toed boots. More recently he injured his right anterior calf on a cart. Ambulating normally per his baseline. He has been undergoing regular debridements, applying mupirocin cream, and an Ace wrap for edema control. He returns to clinic for follow-up and is without complaints. No pain. No fever or chills. No drainage. 10/25/15; this is a 56 year old man who has type II diabetes with diabetic polyneuropathy. He tells me that he fractured his left fifth metatarsal in June 2016 when he presented with swelling. He does not recall a specific injury. His hemoglobin A1c was apparently too high at the time for consideration of surgery and he was put in some form of offloading. Ultimately he went to surgery in December with an allograft from his calcaneus to this site, plate and screws. He had an x-ray of the foot in March that showed concerns about nonunion. He tells me that in March  he had to move and basically moved himself. He was on his foot a lot and then noticed some drainage from an open area. He has been following with his orthopedic surgeon Dr. Doran Durand. He has been applying a felt donut, dry dressing and using his heel healing sandal. 11/01/15; this is a patient I saw last week for the first time. He had a small open wound on the plantar aspect of his left foot at roughly the level of the base of his fifth metatarsal. He had a considerable degree of thickened skin around this wound on the plantar aspect which I thought was from chronic pressure on this area. He tells [place] that he had drainage over the course of the week. No systemic symptoms. 11/08/15; culture last week grew Citrobacter korseri. This should've been sensitive to the Augmentin I gave him. He has seen Dr. Doran Durand who did his initial surgery and according the patient the plan is to give this another month and then the hardware might need to come out of this. This seems  like a reasonable plan. I will adjust his antibiotics to ciprofloxacin which probably should continue for at least another 2 weeks. I gave him 10 days worth today 11/22/15 the patient has completed antibiotics. He has an appointment with Dr. Doran Durand this Friday. There is improved dimensions around the wound on the left fifth metatarsal base 11/29/15; the patient has completed antibiotics last week. Apparently his appointment with Dr. Doran Durand it is not until this Friday. Dimensions are roughly the same. 12/06/15; saw Dr. Doran Durand. No x-ray told the end of the month, next appointment June 30. We have been using Aquacel Ag 12/13/15: No major change this week. Using Aquacel AG 621/17; arrived this week with maceration around the wound. There was quite a bit of undermining which required surgical debridement. I changed him to [facility] last week, by the patient's admission he was up on this more this week 12/27/15; macerated tissue around the wound is removed with a scalpel and pickups. There is no undermining. Nonviable subcutaneous tissue and skin taken from the superior circumference of the wound is slough from the surface. READMISSION 03/06/16 since I last saw this patient at the end of June, he went for surgery on 01/11/16 by Dr. Doran Durand of orthopedics. He had a left foot irrigation and debridement, removal of hardware and placement of wound VAC. He is also been followed by Dr. Megan Salon of infectious disease and completed a six-week course of IV Rocephin for group B strep and Enterobacter in the bone at the time of surgery. Apparently at the time of surgery the bone looked healthy so I don't think any bone was actually removed. He has been using silver alginate based dressings on the same wound area at the base of the left fifth metatarsal on the left. I note that he is also had arterial studies on 01/08/16, these showed a left ABI of 1.2 to and a right ABI of 1.3. Waveforms were listed as biphasic. He was  not felt to have any specific arterial issues. 03/13/16; no real change in the condition of the wound at the left lateral foot at roughly the base of his fifth metatarsal. Use [name], [name]. ([id number]) silver alginate last week. 03/18/16 arrives today with no open area. Being suspicious of the overlying callus I. Some of this back although I see nothing but covering tissue here/epithelium. There is no surrounding tenderness READMISSION 04/16/16 this is a patient I discharged about a month ago.  Initially a surgical wound on the lateral aspect of his left foot which subsequently became infected. The story he is giving today that he went back into his own modified shoe started to notice pain 2 weeks ago he was seen in Dr. Nona Dell office by a physician assistant last Wednesday and according the patient was told that everything looked fine however this is clearly now broken open and he has an open wound in the same spot that we have been dealing with repetitively. Situation is complicated by the fact that he is running short of money on long-term disability. He has not taken his insulin and at least 2 weeks was previously on NovoLog short acting insulin on a sliding scale and TRESIBA 35 units at bedtime. He is no longer able to afford any of his medication he was in the x-ray on 10/15. A plain x-ray showed healed fracture of the left fifth metatarsal bone status post removal of the associated plate and screw fixation hardware. There was no acute appearing osseous abnormality. His blood work showed a white count of 9.2 with an essentially normal differential comprehensive metabolic panel was normal. Previous CT scan of the foot on 01/08/16 showed no osteomyelitis previous vascular workup showed no evidence of significant PAD on 01/13/16 04/23/16; culture I did last week grew enterococcus [ampicillin sensitive] and MRSA. He saw infectious disease yesterday. They stop the clindamycin and ordered an MRI.  This is not unreasonable. All the hardware is out of the foot at this point. 04/30/16 at this point in time we are still awaiting the results of the MRI at this point in time. Patient did have an area which appears to be somewhat macerated in the proximal portion of the wound where there is overlying necrotic skin that is doing nothing more than trapping fluid underneath. He continues to state that he does have some discomfort and again the concern is for the possibility of osteomyelitis hence the reason for the MRI order. We have been using a silver alginate dressing but again I think the reason this with his macerated as it was is that the dressing obscene could not reach the entirety of the wound due to some necrotic skin covering the proximal portion. No bleeding noted at this point in time on initial evaluation. 05/07/16 we receive the results of patient's MRI which shows that he has no evidence of osteomyelitis. This obviously is excellent news. Patient was definitely happy to hear this. He tells me the wound appears to be doing very well at this point in time and is pleased with the progress currently. 05/15/16; as noted the patient did not have osteomyelitis. He has been released by infectious disease and orthopedics. His wound is still open he had a debridement last week but complain when he got home he "bleeding for half a day". He is not had any pain. We have been using silver alginate with Kerlix gauze wrap. 05/28/16; patient arrives today with the wound in much the same condition as last time. He has a small opening on the lateral aspect of his foot however with debridement there is clear undermining medially there is no real evidence of infection here and I didn't see any point in culturing this. One would have to wonder if this isn't a simple matter of in adequate offloading as he is been using a healing sandal. 06/05/16; the open area is now on the plantar aspect of his foot and not  decide. The wound almost appears to have "  migrated". This was the term use by our intake nurse. 06/12/16; open area on the plantar aspect of his foot. Base of the wound looks very healthy. This will be his second week in a total contact cast 06/19/16; patient arrived today in a total contact cast. There was some expectation from our staff and myself that this area would be healed. Unfortunately the area was boggy and with rec pressure a fairly substantial amount of purulent drainage was obtained. Specimen obtained for culture. The patient had no complaints of systemic problems including fever or chills or instability of his diabetes. There was no pain in the foot. Nevertheless a extensive debridement was required. 06/26/16; patient's culture from the abscess last week grew a combination of MRSA and ampicillin sensitive enterococcus. I had him on Augmentin and Septra however I have elected to give him a full 10 day course of Zyvox instead as I Recently treated this combination of organisms with Augmentin and Septra before. Arrives today with no systemic symptoms 07/03/16; the patient has 2 more treatments of Zyvox and then he is finished antibiotics the wound has improved now mostly on the lateral aspect of his foot. There is still some tenderness when he walks. 07/10/16; patient arrives today with Zyvox completed. He only has a small open area remaining. 07/24/16; she arrives here with no overt open area. The covering is thick callus/eschar. Nevertheless there is no open area here. He has some tenderness underneath the area but no overt infection is observed no drainage. The patient has a deformity in the foot with this area weekly to be exposed to more pressure in his foot where nevertheless that something we are going to have to deal with going forward. The patient has diabetic workboots and diabetic shoes. He has had a long difficult course with the area here. This started as a fracture at work. He  had bone grafting from his calcaneus and screws. This got infected he had to have more surgery on the area. Bone at the time of that surgery I think showed enterococcus and group B strep. He had 6 weeks of Rocephin. Since then the area has waxed and waned in its difficulty. Recently he had an MRI in December that did not show osteomyelitis nevertheless he had an abscess that grew MRSA and enterococcus which I elected to treat with Zyvox. This was in December and this wound is actually "healed" over [name], [name] ([id number]) 08/21/16; the patient came in today for his one-month follow-up visit. The area on the lateral aspect of his left foot looks much the same as some month ago. There is no evidence of an open wound here. However the patient tells me about a week after he went back to work he developed severe pain and swelling in the plantar aspect of his right foot first and fifth metatarsal heads. 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 He went to see his podiatrist at [facility]. She told him that he could not work on his feet. She told him to go back and his cam walking boot on the left. Not have open wounds obviously on the right. The patient is actually gone ahead and retired from his job because he does not feel he can work on his feet 09/18/16; patient comes back in saying he recently had some pain on the lateral aspect of his left foot. Asked that we look at  this. Other than that all of his wounds have a viable surface. He has diabetic shoes. He is retired from work. READMISSION [name] is a 56 year old man who we cared for for a prolonged period of time in late 2017 into March 2018. At that point he had suffered a fracture of his left fifth metatarsal at about the level of the base of the fifth metatarsal. He had surgical repair by Dr. Meryl Dare and he developed a very refractory wound over the  fifth metatarsal. Ultimately this became infected he required hardware removal this eventually closed over and we discharged from the clinic in March of this year. He tells me as well until roughly 3 weeks ago when he developed increasing pain in the same area making it difficult for him to sleep. He was admitted to hospital from 9/5 through 9/7 with now an ulcer in the same area on the lateral aspect of his left foot at the level of the base of the metatarsal. X-rays and MRIs during this hospitalization were negative. Wound culture showed a combination of Escherichia coli, Morganella and Pseudomonas. He was given IV Cipro and vancomycin the hospital and then discharged home on Cipro and Doxy. He returned to the ER on 9/16 with leg swelling and discomfort. I don't think anything was added at that point although he did have an ultrasound of the left leg that was negative for DVT. He was back in the ER on 10/4 again with pain in the area he was given Bactrim but states he had a significant allergic reaction to that which has abated once he stopped the Bactrim. I don't think he had a full course. He went to see his podiatrist yesterday who recommended some form of foot soak. He has a Darco forefoot offloading boot The patient is a type II diabetic on insulin. Tells me his recent hemoglobin A1c was 7. Arterial studies done in July 2017 showed an ABI in the right of 1.3 on the left 1.2 to biphasic waveforms bilaterally. He was not felt to have arterial disease. As noted the patient has had 2 MRIs most recently on 10/4 this showed no evidence of an abscess or osteomyelitis and no change since the prior study of 03/05/17 there was nonspecific edema on the dorsum of the foot. ABIs in this clinic today 1.2 which would be unchanged from his study done in July/17 04/15/17; now down to 1 small wound on the left lateral foot. We wrapped him and applied collagen last week and the foot is fairly  macerated. 04/22/17; small wound on the left lateral foot however it has some undermining. Using collagen Objective Constitutional Patient is hypertensive.. Pulse regular and within target range for patient.Marland Kitchen Respirations regular, non-labored and within target range.. Temperature is normal and within the target range for the patient.Marland Kitchen appears in no distress. Vitals Time Taken: 8:49 AM, Height: 70 in, Weight: 272 lbs, BMI: 39, Temperature: 97.7 F, Pulse: 96 bpm, Respiratory Rate: 18 breaths/min, Blood Pressure: 158/85 mmHg. General Notes: When exam; small open wound however with undermining especially superiorly at 0.4-0.5 cm. Using pickups and scalpel I remove the overhanging skin and subcutaneous tissue. This bleeds very easily making for removal difficult. Hemostasis with direct pressure and silver nitrate. SESAR, MADEWELL ([id number]) Integumentary (Hair, Skin) Wound #9 status is Open. Original cause of wound was Gradually Appeared. The wound is located on the Left,Lateral Foot. The wound measures 1cm length x 0.4cm width x 0.3cm depth; 0.314cm^2 area and 0.094cm^3 volume. There  is no tunneling noted, however, there is undermining starting at 12:00 and ending at 12:00 with a maximum distance of 0.5cm. There is a large amount of serous drainage noted. The wound margin is distinct with the outline attached to the wound base. There is small (1-33%) pink granulation within the wound bed. There is a large (67-100%) amount of necrotic tissue within the wound bed including Eschar and Adherent Slough. The periwound skin appearance exhibited: Maceration. Periwound temperature was noted as No Abnormality. The periwound has tenderness on palpation. 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 There was a Skin/Subcutaneous Tissue Debridement ([id number]) debridement with total area of 0.02 sq cm performed by Ricard Dillon, MD. with the following instrument(s): Blade and Forceps to remove Viable and Non-Viable tissue/material including Exudate, Fibrin/Slough, and Subcutaneous after achieving pain control using Lidocaine 4% Topical Solution. A time out was conducted at 09:08, prior to the start of the procedure. A Moderate amount of bleeding was controlled with Silver Nitrate. The procedure was tolerated well with a pain level of 0 throughout and a pain level of 0 following the procedure. Post Debridement Measurements: 1cm length x 0.5cm width x 0.3cm depth; 0.118cm^3 volume. Character of Wound/Ulcer Post Debridement requires further debridement. Severity of Tissue Post Debridement is: Fat layer exposed. Post procedure Diagnosis Wound #9: Same as Pre-Procedure Plan Wound Cleansing: Wound #9 Left,Lateral Foot: Clean wound with wound cleanser. Cleanse wound with mild soap and water Anesthetic: Wound #9 Left,Lateral Foot: Topical Lidocaine 4% cream applied to wound bed prior to debridement Primary Wound Dressing: Wound #9 Left,Lateral Foot: Silvercel Non-Adherent KAREE, CHRISTOPHERSON. ([id number]) Secondary Dressing: Wound #9 Left,Lateral Foot: Dry Gauze Conform/Kerlix Foam Dressing Change Frequency: Wound #9 Left,Lateral Foot: Change dressing every day. Follow-up Appointments: Wound #9 Left,Lateral Foot: Return Appointment in 1 week. Edema Control: Wound #9 Left,Lateral Foot: Elevate legs to the level of the heart and pump ankles as often as possible Additional Orders / Instructions: Wound #9 Left,Lateral Foot: Increase protein intake. Continue silver algiante Electronic Signature(s) Signed: 04/22/2017 4:56:05 PM By: [name] MD Entered  By: [name] on 04/22/2017 09:47:14 [name] ([id number]) -------------------------------------------------------------------------------- [facility] Details Patient Name: [name]. Date of Service: 04/22/2017 Medical Record Number: [id number] Patient Account Number: [id number] Date of Birth/Sex: 1960/12/30 (56 y.o. Male) Treating RN: Ahmed Prima Primary Care Provider: Lorelee Market Other Clinician: Referring Provider: Lorelee Market Treating Provider/Extender: Ricard Dillon Weeks in Treatment: 2 Diagnosis Coding ICD-10 Codes Code Description E11.621 Type 2 diabetes mellitus with foot ulcer L97.521 Non-pressure chronic ulcer of other part of left foot limited to breakdown of skin Facility Procedures CPT4 Code Description: [id number] 11042 - DEB SUBQ TISSUE 20 SQ CM/< ICD-10 Diagnosis Description L97.521 Non-pressure chronic ulcer of other part of left foot limited t Modifier: o breakdown of s Quantity: 1 kin Physician Procedures CPT4 Code Description: [id number] - WC PHYS SUBQ TISS 20 SQ CM ICD-10 Diagnosis Description L97.521 Non-pressure chronic ulcer of other part of left foot limited t Modifier: o breakdown of sk Quantity: 1 in Electronic Signature(s) Signed: 04/22/2017 4:56:05 PM By: [name] MD Entered By: [name] on 04/22/2017 09:47:36

## 2017-04-24 NOTE — Progress Notes (Signed)
[name], [name] ([id number]) Visit Report for 04/22/2017 Arrival Information Details Patient Name: [name], [name]. Date of Service: 04/22/2017 8:45 AM Medical Record Number: [id number] Patient Account Number: [id number] Date of Birth/Sex: 1960-11-08 (56 y.o. Male) Treating RN: Ahmed Prima Primary Care Edword Cu: Lorelee Market Other Clinician: Referring Jacobs Golab: Lorelee Market Treating Josiyah Tozzi/Extender: Tito Dine in Treatment: 2 Visit Information History Since Last Visit All ordered tests and consults were completed: No Patient Arrived: Ambulatory Added or deleted any medications: No Arrival Time: 08:47 Any new allergies or adverse reactions: No Accompanied By: self Had a fall or experienced change in No Transfer Assistance: None activities of daily living that may affect Patient Identification Verified: Yes risk of falls: Secondary Verification Process Completed: Yes Signs or symptoms of abuse/neglect since last No Patient Requires Transmission-Based No visito Precautions: Hospitalized since last visit: No Patient Has Alerts: Yes Has Dressing in Place as Prescribed: Yes Patient Alerts: DM II Has Footwear/Offloading in Place as Yes Prescribed: Left: Surgical Shoe with Pressure Relief Insole Pain Present Now: No Electronic Signature(s) Signed: 04/22/2017 4:58:47 PM By: Alric Quan Entered By: Alric Quan on 04/22/2017 08:49:04 [name] ([id number]) -------------------------------------------------------------------------------- Encounter Discharge Information Details Patient Name: [name]. Date of Service: 04/22/2017 8:45 AM Medical Record Number: [id number] Patient Account Number: [id number] Date of Birth/Sex: 19-Jun-1961 (56 y.o. Male) Treating RN: Ahmed Prima Primary Care Aeriel Boulay: Lorelee Market Other Clinician: Referring Haile Toppins: Lorelee Market Treating Erbie Arment/Extender: Tito Dine in  Treatment: 2 Encounter Discharge Information Items Discharge Pain Level: 0 Discharge Condition: Stable Ambulatory Status: Ambulatory Discharge Destination: Home Transportation: Private Auto Accompanied By: self Schedule Follow-up Appointment: Yes Medication Reconciliation completed and No provided to Patient/Care Geordan Xu: Provided on Clinical Summary of Care: 04/22/2017 Form Type Recipient Paper Patient HP Electronic Signature(s) Signed: 04/22/2017 9:27:43 AM By: Alric Quan Previous Signature: 04/22/2017 8:57:03 AM Version By: Alric Quan Entered By: Alric Quan on 04/22/2017 09:27:43 [name] ([id number]) -------------------------------------------------------------------------------- Lower Extremity Assessment Details Patient Name: [name]. Date of Service: 04/22/2017 8:45 AM Medical Record Number: [id number] Patient Account Number: [id number] Date of Birth/Sex: 12-22-1960 (56 y.o. Male) Treating RN: Ahmed Prima Primary Care Adda Stokes: Lorelee Market Other Clinician: Referring Camp Gopal: Lorelee Market Treating Quinlee Sciarra/Extender: Ricard Dillon Weeks in Treatment: 2 Vascular Assessment Pulses: Dorsalis Pedis Palpable: [Left:No] Doppler Audible: [Left:Yes] Posterior Tibial Extremity colors, hair growth, and conditions: Extremity Color: [Left:Hyperpigmented] Temperature of Extremity: [Left:Warm] Capillary Refill: [Left:> 3 seconds] Toe Nail Assessment Left: Right: Thick: Yes Discolored: Yes Deformed: No Improper Length and Hygiene: No Electronic Signature(s) Signed: 04/22/2017 4:58:47 PM By: Alric Quan Entered By: Alric Quan on 04/22/2017 08:55:35 Cunningham, [name] ([id number]) -------------------------------------------------------------------------------- Multi Wound Chart Details Patient Name: [name]. Date of Service: 04/22/2017 8:45 AM Medical Record Number: [id number] Patient Account Number:  [id number] Date of Birth/Sex: 1960/07/29 (56 y.o. Male) Treating RN: Ahmed Prima Primary Care Anthon Harpole: Lorelee Market Other Clinician: Referring Holt Woolbright: Lorelee Market Treating Rebel Willcutt/Extender: Ricard Dillon Weeks in Treatment: 2 Vital Signs Height(in): 70 Pulse(bpm): 96 Weight(lbs): 272 Blood Pressure(mmHg): 158/85 Body Mass Index(BMI): 39 Temperature(F): 97.7 Respiratory Rate 18 (breaths/min): Photos: [9:No Photos] [N/A:N/A] Wound Location: [9:Left Foot - Lateral] [N/A:N/A] Wounding Event: [9:Gradually Appeared] [N/A:N/A] Primary Etiology: [9:Diabetic Wound/Ulcer of the Lower Extremity] [N/A:N/A] Comorbid History: [9:Hypertension, Type II Diabetes, Neuropathy] [N/A:N/A] Date Acquired: [9:03/18/2017] [N/A:N/A] Weeks of Treatment: [9:2] [N/A:N/A] Wound Status: [9:Open] [N/A:N/A] Measurements L x W x D [9:1x0.4x0.3] [N/A:N/A] (cm) Area (cm) : [9:0.314] [N/A:N/A] Volume (cm) : [9:0.094] [N/A:N/A] % Reduction in Area: [  9:50.00%] [N/A:N/A] % Reduction in Volume: [9:25.40%] [N/A:N/A] Starting Position 1 [9:12] (o'clock): Ending Position 1 [9:12] (o'clock): Maximum Distance 1 (cm): [9:0.5] Undermining: [9:Yes] [N/A:N/A] Classification: [9:Grade 1] [N/A:N/A] Exudate Amount: [9:Large] [N/A:N/A] Exudate Type: [9:Serous] [N/A:N/A] Exudate Color: [9:amber] [N/A:N/A] Wound Margin: [9:Distinct, outline attached] [N/A:N/A] Granulation Amount: [9:Small (1-33%)] [N/A:N/A] Granulation Quality: [9:Pink] [N/A:N/A] Necrotic Amount: [9:Large (67-100%)] [N/A:N/A] Necrotic Tissue: [9:Eschar, Adherent Slough] [N/A:N/A] Epithelialization: [9:None] [N/A:N/A] Debridement: [9:Debridement ([id number])] [N/A:N/A] Pre-procedure [4:62:70] [N/A:N/A] Verification/Time Out Taken: Pain Control: [9:Lidocaine 4% Topical Solution N/A] Tissue Debrided: [N/A:N/A] Fibrin/Slough, Exudates, Subcutaneous Level: Skin/Subcutaneous Tissue N/A N/A Debridement Area (sq cm): 0.02 N/A  N/A Instrument: Blade, Forceps N/A N/A Bleeding: Moderate N/A N/A Hemostasis Achieved: Silver Nitrate N/A N/A Procedural Pain: 0 N/A N/A Post Procedural Pain: 0 N/A N/A Debridement Treatment Procedure was tolerated well N/A N/A Response: Post Debridement 1x0.5x0.3 N/A N/A Measurements L x W x D (cm) Post Debridement Volume: 0.118 N/A N/A (cm) Periwound Skin Texture: No Abnormalities Noted N/A N/A Periwound Skin Moisture: Maceration: Yes N/A N/A Periwound Skin Color: No Abnormalities Noted N/A N/A Temperature: No Abnormality N/A N/A Tenderness on Palpation: Yes N/A N/A Wound Preparation: Ulcer Cleansing: N/A N/A Rinsed/Irrigated with Saline Topical Anesthetic Applied: Other: lidocaine 4% Procedures Performed: Debridement N/A N/A Treatment Notes Wound #9 (Left, Lateral Foot) 1. Cleansed with: Clean wound with Normal Saline 2. Anesthetic Topical Lidocaine 4% cream to wound bed prior to debridement 4. Dressing Applied: Other dressing (specify in notes) 5. Secondary Dressing Applied Dry Gauze Foam Kerlix/Conform 7. Secured with Tape Notes silvercel, darco Research scientist (life sciences)) Signed: 04/22/2017 4:56:05 PM By: [name] MD Previous Signature: 04/22/2017 8:56:45 AM Version By: Alric Quan Entered By: [name] on 04/22/2017 09:44:35 [name] ([id number]) -------------------------------------------------------------------------------- [place] Details Patient Name: BRODEE, MAURITZ. Date of Service: 04/22/2017 8:45 AM Medical Record Number: [id number] Patient Account Number: [id number] Date of Birth/Sex: 22-Jan-1961 (56 y.o. Male) Treating RN: Ahmed Prima Primary Care Jaida Basurto: Lorelee Market Other Clinician: Referring Jarrette Dehner: Lorelee Market Treating Tracye Szuch/Extender: Tito Dine in Treatment: 2 Active Inactive ` Abuse / Safety / Falls / Self Care Management Nursing Diagnoses: History of  Falls Goals: Patient will not experience any injury related to falls Date Initiated: 04/08/2017 Target Resolution Date: 08/09/2017 Goal Status: Active Interventions: Assess fall risk on admission and as needed Notes: ` Nutrition Nursing Diagnoses: Imbalanced nutrition Impaired glucose control: actual or potential Potential for alteratiion in Nutrition/Potential for imbalanced nutrition Goals: Patient/caregiver agrees to and verbalizes understanding of need to use nutritional supplements and/or vitamins as prescribed Date Initiated: 04/08/2017 Target Resolution Date: 08/09/2017 Goal Status: Active Patient/caregiver will maintain therapeutic glucose control Date Initiated: 04/08/2017 Target Resolution Date: 08/09/2017 Goal Status: Active Interventions: Assess patient nutrition upon admission and as needed per policy Provide education on elevated blood sugars and impact on wound healing Provide education on nutrition Treatment Activities: Education provided on Nutrition : 04/08/2017 Notes: JERMIE, HIPPE ([id number]) Orientation to the Wound Care Program Nursing Diagnoses: Knowledge deficit related to the wound healing center program Goals: Patient/caregiver will verbalize understanding of the [place] [facility] Date Initiated: 04/08/2017 Target Resolution Date: 05/10/2017 Goal Status: Active Interventions: Provide education on orientation to the wound center Notes: ` Pain, Acute or Chronic Nursing Diagnoses: Pain, acute or chronic: actual or potential Potential alteration in comfort, pain Goals: Patient/caregiver will verbalize adequate pain control between visits Date Initiated: 04/08/2017 Target Resolution Date: 08/09/2017 Goal Status: Active Interventions: Complete pain assessment as per visit requirements Notes: ` Wound/Skin Impairment  Nursing Diagnoses: Impaired tissue integrity Knowledge deficit related to ulceration/compromised skin  integrity Goals: Ulcer/skin breakdown will have a volume reduction of 80% by week 12 Date Initiated: 04/08/2017 Target Resolution Date: 08/02/2017 Goal Status: Active Interventions: Assess patient/caregiver ability to perform ulcer/skin care regimen upon admission and as needed Assess ulceration(s) every visit Notes: Electronic Signature(s) Signed: 04/22/2017 8:56:36 AM By: Alric Quan Entered By: Alric Quan on 04/22/2017 08:56:35 [name] ([id number]) Kipp Brood, [name] ([id number]) -------------------------------------------------------------------------------- Pain Assessment Details Patient Name: [name]. Date of Service: 04/22/2017 8:45 AM Medical Record Number: [id number] Patient Account Number: [id number] Date of Birth/Sex: 29-Jan-1961 (55 y.o. Male) Treating RN: Ahmed Prima Primary Care Jorden Mahl: Lorelee Market Other Clinician: Referring Mckinzi Eriksen: Lorelee Market Treating Izora Benn/Extender: Ricard Dillon Weeks in Treatment: 2 Active Problems Location of Pain Severity and Description of Pain Patient Has Paino No Site Locations Pain Management and Medication Current Pain Management: Electronic Signature(s) Signed: 04/22/2017 4:58:47 PM By: Alric Quan Entered By: Alric Quan on 04/22/2017 08:49:07 [name] ([id number]) -------------------------------------------------------------------------------- Patient/Caregiver Education Details Patient Name: [name]. Date of Service: 04/22/2017 8:45 AM Medical Record Number: [id number] Patient Account Number: [id number] Date of Birth/Gender: 1961/02/03 (56 y.o. Male) Treating RN: Ahmed Prima Primary Care Physician: Lorelee Market Other Clinician: Referring Physician: Lorelee Market Treating Physician/Extender: Tito Dine in Treatment: 2 Education Assessment Education Provided To: Patient Education Topics Provided Wound/Skin  Impairment: Handouts: Other: change dressing as ordered Methods: Demonstration, Explain/Verbal Responses: State content correctly Electronic Signature(s) Signed: 04/22/2017 4:58:47 PM By: Alric Quan Entered By: Alric Quan on 04/22/2017 08:57:20 Serano, [name] ([id number]) -------------------------------------------------------------------------------- Wound Assessment Details Patient Name: [name]. Date of Service: 04/22/2017 8:45 AM Medical Record Number: [id number] Patient Account Number: [id number] Date of Birth/Sex: 12-28-1960 (56 y.o. Male) Treating RN: Ahmed Prima Primary Care Soma Lizak: Lorelee Market Other Clinician: Referring Rakeisha Nyce: Lorelee Market Treating Cherina Dhillon/Extender: Ricard Dillon Weeks in Treatment: 2 Wound Status Wound Number: 9 Primary Etiology: Diabetic Wound/Ulcer of the Lower Extremity Wound Location: Left Foot - Lateral Wound Status: Open Wounding Event: Gradually Appeared Comorbid Hypertension, Type II Diabetes, Date Acquired: 03/18/2017 History: Neuropathy Weeks Of Treatment: 2 Clustered Wound: No Photos Photo Uploaded By: Alric Quan on 04/22/2017 10:52:06 Wound Measurements Length: (cm) 1 Width: (cm) 0.4 Depth: (cm) 0.3 Area: (cm) 0.314 Volume: (cm) 0.094 % Reduction in Area: 50% % Reduction in Volume: 25.4% Epithelialization: None Tunneling: No Undermining: Yes Starting Position (o'clock): 12 Ending Position (o'clock): 12 Maximum Distance: (cm) 0.5 Wound Description Classification: Grade 1 Wound Margin: Distinct, outline attached Exudate Amount: Large Exudate Type: Serous Exudate Color: amber Foul Odor After Cleansing: No Slough/Fibrino Yes Wound Bed Granulation Amount: Small (1-33%) Granulation Quality: Pink Necrotic Amount: Large (67-100%) Necrotic Quality: Eschar, Adherent [id number] BADR, PIED[place]. ([id number]) Texture Color No Abnormalities Noted: No No  Abnormalities Noted: No Moisture Temperature / Pain No Abnormalities Noted: No Temperature: No Abnormality Maceration: Yes Tenderness on Palpation: Yes Wound Preparation Ulcer Cleansing: Rinsed/Irrigated with Saline Topical Anesthetic Applied: Other: lidocaine 4%, Treatment Notes Wound #9 (Left, Lateral Foot) 1. Cleansed with: Clean wound with Normal Saline 2. Anesthetic Topical Lidocaine 4% cream to wound bed prior to debridement 4. Dressing Applied: Other dressing (specify in notes) 5. Secondary Dressing Applied Dry Gauze Foam Kerlix/Conform 7. Secured with Tape Notes silvercel, darco Designer, jewellery Signature(s) Signed: 04/22/2017 4:58:47 PM By: Alric Quan Entered By: Alric Quan on 04/22/2017 08:54:07 [name] ([id number]) -------------------------------------------------------------------------------- Vitals Details Patient Name: [name]. Date of Service:  04/22/2017 8:45 AM Medical Record Number: [id number] Patient Account Number: [id number] Date of Birth/Sex: Apr 15, 1961 (56 y.o. Male) Treating RN: Ahmed Prima Primary Care Krystale Rinkenberger: Lorelee Market Other Clinician: Referring Armine Rizzolo: Lorelee Market Treating Venecia Mehl/Extender: Ricard Dillon Weeks in Treatment: 2 Vital Signs Time Taken: 08:49 Temperature (F): 97.7 Height (in): 70 Pulse (bpm): 96 Weight (lbs): 272 Respiratory Rate (breaths/min): 18 Body Mass Index (BMI): 39 Blood Pressure (mmHg): 158/85 Reference Range: 80 - 120 mg / dl Electronic Signature(s) Signed: 04/22/2017 4:58:47 PM By: Alric Quan Entered By: Alric Quan on 04/22/2017 08:51:03

## 2017-04-29 ENCOUNTER — Encounter: Payer: Commercial Managed Care - PPO | Admitting: Internal Medicine

## 2017-04-29 DIAGNOSIS — E11621 Type 2 diabetes mellitus with foot ulcer: Secondary | ICD-10-CM | POA: Diagnosis not present

## 2017-05-02 NOTE — Progress Notes (Signed)
LAMIN, CHANDLEY ([id number]) Visit Report for 04/29/2017 Arrival Information Details Patient Name: [name], [name]. Date of Service: 04/29/2017 8:45 AM Medical Record Number: [id number] Patient Account Number: [id number] Date of Birth/Sex: 05/18/1961 (56 y.o. Male) Treating RN: [name] Primary Care [name]: [name] Other Clinician: Referring [name]: [name] Treating [name]/Extender: [name] in Treatment: 3 Visit Information History Since Last Visit All ordered tests and consults were completed: No Patient Arrived: Ambulatory Added or deleted any medications: No Arrival Time: 08:41 Any new allergies or adverse reactions: No Accompanied By: self Had a fall or experienced change in No Transfer Assistance: None activities of daily living that may affect Patient Identification Verified: Yes risk of falls: Secondary Verification Process Completed: Yes Signs or symptoms of abuse/neglect since last No Patient Requires Transmission-Based No visito Precautions: Hospitalized since last visit: No Patient Has Alerts: Yes Has Dressing in Place as Prescribed: Yes Patient Alerts: DM II Has Footwear/Offloading in Place as Yes Prescribed: Left: Surgical Shoe with Pressure Relief Insole Pain Present Now: No Electronic Signature(s) Signed: 04/29/2017 10:20:25 AM By: [name] Entered By: [name] on 04/29/2017 10:20:25 [name] ([id number]) -------------------------------------------------------------------------------- Clinic Level of Care Assessment Details Patient Name: [name]. Date of Service: 04/29/2017 8:45 AM Medical Record Number: [id number] Patient Account Number: [id number] Date of Birth/Sex: [birth year] (56 y.o. Male) Treating RN: [name] Primary Care [name]: [name] Other Clinician: Referring [name]: [name] Treating [name]/Extender: [name] in  Treatment: 3 Clinic Level of Care Assessment Items TOOL 4 Quantity Score X - Use when only an EandM is performed on FOLLOW-UP visit 1 0 ASSESSMENTS - Nursing Assessment / Reassessment X - Reassessment of Co-morbidities (includes updates in patient status) 1 10 X- 1 5 Reassessment of Adherence to Treatment Plan ASSESSMENTS - Wound and Skin Assessment / Reassessment X - Simple Wound Assessment / Reassessment - one wound 1 5 []  - 0 Complex Wound Assessment / Reassessment - multiple wounds []  - 0 Dermatologic / Skin Assessment (not related to wound area) ASSESSMENTS - Focused Assessment []  - Circumferential Edema Measurements - multi extremities 0 []  - 0 Nutritional Assessment / Counseling / Intervention []  - 0 Lower Extremity Assessment (monofilament, tuning fork, pulses) []  - 0 Peripheral Arterial Disease Assessment (using hand held doppler) ASSESSMENTS - Ostomy and/or Continence Assessment and Care []  - Incontinence Assessment and Management 0 []  - 0 Ostomy Care Assessment and Management (repouching, etc.) PROCESS - Coordination of Care X - Simple Patient / Family Education for ongoing care 1 15 []  - 0 Complex (extensive) Patient / Family Education for ongoing care []  - 0 Staff obtains Programmer, systems, Records, Test Results / Process Orders []  - 0 Staff telephones HHA, Nursing Homes / Clarify orders / etc []  - 0 Routine Transfer to another Facility (non-emergent condition) []  - 0 Routine Hospital Admission (non-emergent condition) []  - 0 New Admissions / Biomedical engineer / Ordering NPWT, Apligraf, etc. []  - 0 Emergency Hospital Admission (emergent condition) X- 1 10 Simple Discharge Coordination [name], [name]. ([id number]) []  - 0 Complex (extensive) Discharge Coordination PROCESS - Special Needs []  - Pediatric / Minor Patient Management 0 []  - 0 Isolation Patient Management []  - 0 Hearing / Language / Visual special needs []  - 0 Assessment of Community  assistance (transportation, D/C planning, etc.) []  - 0 Additional assistance / Altered mentation []  - 0 Support Surface(s) Assessment (bed, cushion, seat, etc.) INTERVENTIONS - Wound Cleansing / Measurement X - Simple Wound Cleansing - one  wound 1 5 []  - 0 Complex Wound Cleansing - multiple wounds X- 1 5 Wound Imaging (photographs - any number of wounds) []  - 0 Wound Tracing (instead of photographs) X- 1 5 Simple Wound Measurement - one wound []  - 0 Complex Wound Measurement - multiple wounds INTERVENTIONS - Wound Dressings X - Small Wound Dressing one or multiple wounds 1 10 []  - 0 Medium Wound Dressing one or multiple wounds []  - 0 Large Wound Dressing one or multiple wounds X- 1 5 Application of Medications - topical []  - 0 Application of Medications - injection INTERVENTIONS - Miscellaneous []  - External ear exam 0 []  - 0 Specimen Collection (cultures, biopsies, blood, body fluids, etc.) []  - 0 Specimen(s) / Culture(s) sent or taken to Lab for analysis []  - 0 Patient Transfer (multiple staff / Civil Service fast streamer / Similar devices) []  - 0 Simple Staple / Suture removal (25 or less) []  - 0 Complex Staple / Suture removal (26 or more) []  - 0 Hypo / Hyperglycemic Management (close monitor of Blood Glucose) []  - 0 Ankle / Brachial Index (ABI) - do not check if billed separately X- 1 5 Vital Signs [name], [name] DOWNS. ([id number]) Has the patient been seen at the hospital within the last three years: Yes Total Score: 80 Level Of Care: New/Established - Level 3 Electronic Signature(s) Signed: 04/30/2017 4:35:14 PM By: [name] Entered By: [name] on 04/29/2017 10:21:03 [name] ([id number]) -------------------------------------------------------------------------------- Encounter Discharge Information Details Patient Name: [name], [name]. Date of Service: 04/29/2017 8:45 AM Medical Record Number: [id number] Patient Account Number: [id number] Date of  Birth/Sex: 1961-04-19 (56 y.o. Male) Treating RN: [name] Primary Care [name]: [name] Other Clinician: Referring [name]: [name] Treating [name]/Extender: [name] in Treatment: 3 Encounter Discharge Information Items Discharge Pain Level: 0 Discharge Condition: Stable Ambulatory Status: Ambulatory Discharge Destination: Home Transportation: Private Auto Accompanied By: self Schedule Follow-up Appointment: Yes Medication Reconciliation completed and No provided to Patient/Care Loriel Diehl: Patient Clinical Summary of Care: Declined Electronic Signature(s) Signed: 04/30/2017 9:28:38 AM By: Ruthine Dose Previous Signature: 04/29/2017 8:59:49 AM Version By: [name] Entered By: Ruthine Dose on 04/29/2017 09:22:46 [name] ([id number]) -------------------------------------------------------------------------------- Lower Extremity Assessment Details Patient Name: [name]. Date of Service: 04/29/2017 8:45 AM Medical Record Number: [id number] Patient Account Number: [id number] Date of Birth/Sex: Nov 13, 1960 (56 y.o. Male) Treating RN: [name] Primary Care Elizaveta Mattice: [name] Other Clinician: Referring Kym Fenter: [name] Treating Taelyn Broecker/Extender: Ricard Dillon Weeks in Treatment: 3 Vascular Assessment Pulses: Dorsalis Pedis Palpable: [Left:No] Doppler Audible: [Left:Yes] Posterior Tibial Extremity colors, hair growth, and conditions: Extremity Color: [Left:Hyperpigmented] Temperature of Extremity: [Left:Warm] Capillary Refill: [Left:> 3 seconds] Toe Nail Assessment Left: Right: Thick: Yes Discolored: Yes Deformed: No Improper Length and Hygiene: No Electronic Signature(s) Signed: 04/30/2017 4:35:14 PM By: [name] Entered By: [name] on 04/29/2017 08:52:37 [name], [name]  ([id number]) -------------------------------------------------------------------------------- Multi Wound Chart Details Patient Name: [name]. Date of Service: 04/29/2017 8:45 AM Medical Record Number: [id number] Patient Account Number: [id number] Date of Birth/Sex: August 17, 1960 (56 y.o. Male) Treating RN: [name] Primary Care Connie Lasater: [name] Other Clinician: Referring Sirena Riddle: [name] Treating Harman Langhans/Extender: Ricard Dillon Weeks in Treatment: 3 Vital Signs Height(in): 70 Pulse(bpm): 102 Weight(lbs): 272 Blood Pressure(mmHg): 156/87 Body Mass Index(BMI): 39 Temperature(F): 98.4 Respiratory Rate 18 (breaths/min): Photos: [9:No Photos] [N/A:N/A] Wound Location: [9:Left Foot - Lateral] [N/A:N/A] Wounding Event: [9:Gradually Appeared] [N/A:N/A] Primary Etiology: [9:Diabetic Wound/Ulcer of the Lower Extremity] [N/A:N/A] Comorbid History: [  9:Hypertension, Type II Diabetes, Neuropathy] [N/A:N/A] Date Acquired: [9:03/18/2017] [N/A:N/A] Weeks of Treatment: [9:3] [N/A:N/A] Wound Status: [9:Open] [N/A:N/A] Measurements L x W x D [9:0.5x0.3x0.2] [N/A:N/A] (cm) Area (cm) : [9:0.118] [N/A:N/A] Volume (cm) : [9:0.024] [N/A:N/A] % Reduction in Area: [9:81.20%] [N/A:N/A] % Reduction in Volume: [9:81.00%] [N/A:N/A] Classification: [9:Grade 1] [N/A:N/A] Exudate Amount: [9:Large] [N/A:N/A] Exudate Type: [9:Serous] [N/A:N/A] Exudate Color: [9:amber] [N/A:N/A] Wound Margin: [9:Distinct, outline attached] [N/A:N/A] Granulation Amount: [9:Small (1-33%)] [N/A:N/A] Granulation Quality: [9:Pink] [N/A:N/A] Necrotic Amount: [9:Large (67-100%)] [N/A:N/A] Necrotic Tissue: [9:Eschar, Adherent Slough] [N/A:N/A] Epithelialization: [9:None] [N/A:N/A] Periwound Skin Texture: [9:No Abnormalities Noted] [N/A:N/A] Periwound Skin Moisture: [9:Maceration: Yes] [N/A:N/A] Periwound Skin Color: [9:No Abnormalities Noted] [N/A:N/A] Temperature: [9:No Abnormality]  [N/A:N/A] Tenderness on Palpation: [9:Yes] [N/A:N/A] Wound Preparation: [9:Ulcer Cleansing: Rinsed/Irrigated with Saline] [N/A:N/A] Topical Anesthetic Applied: Other: lidocaine 4% [name], [name] ([id number]) Treatment Notes Electronic Signature(s) Signed: 04/29/2017 4:48:36 PM By: [name] MD Entered By: [name] on 04/29/2017 09:18:43 [name] ([id number]) -------------------------------------------------------------------------------- [place] Details Patient Name: [name], MARVIN. Date of Service: 04/29/2017 8:45 AM Medical Record Number: [id number] Patient Account Number: [id number] Date of Birth/Sex: 05-22-1961 (56 y.o. Male) Treating RN: [name] Primary Care Eliott Amparan: [name] Other Clinician: Referring Harvis Mabus: [name] Treating Tyse Auriemma/Extender: [name] in Treatment: 3 Active Inactive ` Abuse / Safety / Falls / Self Care Management Nursing Diagnoses: History of Falls Goals: Patient will not experience any injury related to falls Date Initiated: 04/08/2017 Target Resolution Date: 08/09/2017 Goal Status: Active Interventions: Assess fall risk on admission and as needed Notes: ` Nutrition Nursing Diagnoses: Imbalanced nutrition Impaired glucose control: actual or potential Potential for alteratiion in Nutrition/Potential for imbalanced nutrition Goals: Patient/caregiver agrees to and verbalizes understanding of need to use nutritional supplements and/or vitamins as prescribed Date Initiated: 04/08/2017 Target Resolution Date: 08/09/2017 Goal Status: Active Patient/caregiver will maintain therapeutic glucose control Date Initiated: 04/08/2017 Target Resolution Date: 08/09/2017 Goal Status: Active Interventions: Assess patient nutrition upon admission and as needed per policy Provide education on elevated blood sugars and impact on wound healing Provide education on  nutrition Treatment Activities: Education provided on Nutrition : 04/08/2017 Notes: [name], [name] ([id number]) Orientation to the Wound Care Program Nursing Diagnoses: Knowledge deficit related to the wound healing center program Goals: Patient/caregiver will verbalize understanding of the [facility] Date Initiated: 04/08/2017 Target Resolution Date: 05/10/2017 Goal Status: Active Interventions: Provide education on orientation to the wound center Notes: ` Pain, Acute or Chronic Nursing Diagnoses: Pain, acute or chronic: actual or potential Potential alteration in comfort, pain Goals: Patient/caregiver will verbalize adequate pain control between visits Date Initiated: 04/08/2017 Target Resolution Date: 08/09/2017 Goal Status: Active Interventions: Complete pain assessment as per visit requirements Notes: ` Wound/Skin Impairment Nursing Diagnoses: Impaired tissue integrity Knowledge deficit related to ulceration/compromised skin integrity Goals: Ulcer/skin breakdown will have a volume reduction of 80% by week 12 Date Initiated: 04/08/2017 Target Resolution Date: 08/02/2017 Goal Status: Active Interventions: Assess patient/caregiver ability to perform ulcer/skin care regimen upon admission and as needed Assess ulceration(s) every visit Notes: Electronic Signature(s) Signed: 04/30/2017 4:35:14 PM By: [name] Entered By: [name] on 04/29/2017 08:52:42 [name] ([id number]) [name], [name] ([id number]) -------------------------------------------------------------------------------- Pain Assessment Details Patient Name: [name]. Date of Service: 04/29/2017 8:45 AM Medical Record Number: [id number] Patient Account Number: [id number] Date of Birth/Sex: Feb 26, 1961 (56 y.o. Male) Treating RN: [name] Primary Care Cadyn Fann: [name] Other Clinician: Referring Zabrina Brotherton: [name] Treating  Arnella Pralle/Extender: [name]  in Treatment: 3 Active Problems Location of Pain Severity and Description of Pain Patient Has Paino No Site Locations Pain Management and Medication Current Pain Management: Electronic Signature(s) Signed: 04/30/2017 4:35:14 PM By: [name] Entered By: [name] on 04/29/2017 08:43:19 [name] ([id number]) -------------------------------------------------------------------------------- Patient/Caregiver Education Details Patient Name: [name]. Date of Service: 04/29/2017 8:45 AM Medical Record Number: [id number] Patient Account Number: [id number] Date of Birth/Gender: 12-01-1960 (56 y.o. Male) Treating RN: [name] Primary Care Physician: [name] Other Clinician: Referring Physician: Lorelee [name] Treating Physician/Extender: [name] in Treatment: 3 Education Assessment Education Provided To: Patient Education Topics Provided Wound/Skin Impairment: Handouts: Other: change dressing as ordered Methods: Demonstration, Explain/Verbal Responses: State content correctly Electronic Signature(s) Signed: 04/30/2017 4:35:14 PM By: [name] Entered By: [name] on 04/29/2017 09:00:08 [name] ([id number]) -------------------------------------------------------------------------------- Wound Assessment Details Patient Name: [name]. Date of Service: 04/29/2017 8:45 AM Medical Record Number: [id number] Patient Account Number: [id number] Date of Birth/Sex: Mar 06, 1961 (56 y.o. Male) Treating RN: [name] Primary Care Stan Cantave: [name] Other Clinician: Referring Matisyn Cabeza: [name] Treating Kaitland Lewellyn/Extender: Ricard Dillon Weeks in Treatment: 3 Wound Status Wound Number: 9 Primary Etiology: Diabetic Wound/Ulcer of the Lower Extremity Wound Location: Left Foot - Lateral Wound Status: Open Wounding Event:  Gradually Appeared Comorbid Hypertension, Type II Diabetes, Date Acquired: 03/18/2017 History: Neuropathy Weeks Of Treatment: 3 Clustered Wound: No Photos Photo Uploaded By: [name] on 04/30/2017 16:11:20 Wound Measurements Length: (cm) 0.5 Width: (cm) 0.3 Depth: (cm) 0.2 Area: (cm) 0.118 Volume: (cm) 0.024 % Reduction in Area: 81.2% % Reduction in Volume: 81% Epithelialization: None Tunneling: No Undermining: No Wound Description Classification: Grade 1 Wound Margin: Distinct, outline attached Exudate Amount: Large Exudate Type: Serous Exudate Color: amber Foul Odor After Cleansing: No Slough/Fibrino Yes Wound Bed Granulation Amount: Small (1-33%) Granulation Quality: Pink Necrotic Amount: Large (67-100%) Necrotic Quality: Eschar, Adherent Slough Periwound Skin Texture Texture Color No Abnormalities Noted: No No Abnormalities Noted: No Moisture Temperature / Pain ARSEN, MANGIONE. ([id number]) No Abnormalities Noted: No Temperature: No Abnormality Maceration: Yes Tenderness on Palpation: Yes Wound Preparation Ulcer Cleansing: Rinsed/Irrigated with Saline Topical Anesthetic Applied: Other: lidocaine 4%, Treatment Notes Wound #9 (Left, Lateral Foot) 1. Cleansed with: Clean wound with Normal Saline 2. Anesthetic Topical Lidocaine 4% cream to wound bed prior to debridement 4. Dressing Applied: Other dressing (specify in notes) 5. Secondary Dressing Applied Dry Gauze Foam 7. Secured with Tape Notes silvercel, darco Designer, jewellery Signature(s) Signed: 04/30/2017 4:35:14 PM By: [name] Entered By: [name] on 04/29/2017 08:51:46 [name] ([id number]) -------------------------------------------------------------------------------- Vitals Details Patient Name: [name]. Date of Service: 04/29/2017 8:45 AM Medical Record Number: [id number] Patient Account Number: [id number] Date of Birth/Sex: 08/26/1960 (56 y.o.  Male) Treating RN: [name] Primary Care Jacquese Cassarino: [name] Other Clinician: Referring Andrik Sandt: [name] Treating Rocky Gladden/Extender: Ricard Dillon Weeks in Treatment: 3 Vital Signs Time Taken: 08:43 Temperature (F): 98.4 Height (in): 70 Pulse (bpm): 102 Weight (lbs): 272 Respiratory Rate (breaths/min): 18 Body Mass Index (BMI): 39 Blood Pressure (mmHg): 156/87 Reference Range: 80 - 120 mg / dl Electronic Signature(s) Signed: 04/30/2017 4:35:14 PM By: [name] Entered By: [name] on 04/29/2017 08:45:53

## 2017-05-02 NOTE — Progress Notes (Signed)
JAYKE, CAUL ([id number]) Visit Report for 04/29/2017 HPI Details Patient Name: [name], [name]. Date of Service: 04/29/2017 8:45 AM Medical Record Number: [id number] Patient Account Number: [id number] Date of Birth/Sex: [birth year] (56 y.o. Male) Treating RN: Ahmed Prima Primary Care Provider: Lorelee Market Other Clinician: Referring Provider: Lorelee Market Treating Provider/Extender: Ricard Dillon Weeks in Treatment: 3 History of Present Illness HPI Description: Pleasant 56 year old with history of diabetes (Hgb A1c 10.8 in 2014) and peripheral neuropathy. No PVD. L ABI 1.1. Status post right great toe partial amputation years ago. He was at work and on 10/22/2014, was injured by a cart, and suffered an ulceration to his left anterior calf. He says that it subsequently became infected, and he was treated with a course of antibiotics. He was found on initial exam to have an ulceration on the dorsum of his left third toe. He was unaware of this and attributes it to pressure from his steel toed boots. More recently he injured his right anterior calf on a cart. Ambulating normally per his baseline. He has been undergoing regular debridements, applying mupirocin cream, and an Ace wrap for edema control. He returns to clinic for follow-up and is without complaints. No pain. No fever or chills. No drainage. 10/25/15; this is a 56 year old man who has type II diabetes with diabetic polyneuropathy. He tells me that he fractured his left fifth metatarsal in June 2016 when he presented with swelling. He does not recall a specific injury. His hemoglobin A1c was apparently too high at the time for consideration of surgery and he was put in some form of offloading. Ultimately he went to surgery in December with an allograft from his calcaneus to this site, plate and screws. He had an x-ray of the foot in March that showed concerns about nonunion. He tells me that in March he had to  move and basically moved himself. He was on his foot a lot and then noticed some drainage from an open area. He has been following with his orthopedic surgeon Dr. Doran Durand. He has been applying a felt donut, dry dressing and using his heel healing sandal. 11/01/15; this is a patient I saw last week for the first time. He had a small open wound on the plantar aspect of his left foot at roughly the level of the base of his fifth metatarsal. He had a considerable degree of thickened skin around this wound on the plantar aspect which I thought was from chronic pressure on this area. He tells [place] that he had drainage over the course of the week. No systemic symptoms. 11/08/15; culture last week grew Citrobacter korseri. This should've been sensitive to the Augmentin I gave him. He has seen Dr. Doran Durand who did his initial surgery and according the patient the plan is to give this another month and then the hardware might need to come out of this. This seems like a reasonable plan. I will adjust his antibiotics to ciprofloxacin which probably should continue for at least another 2 weeks. I gave him 10 days worth today 11/22/15 the patient has completed antibiotics. He has an appointment with Dr. Doran Durand this Friday. There is improved dimensions around the wound on the left fifth metatarsal base 11/29/15; the patient has completed antibiotics last week. Apparently his appointment with Dr. Doran Durand it is not until this Friday. Dimensions are roughly the same. 12/06/15; saw Dr. Doran Durand. No x-ray told the end of the month, next appointment June 30. We have been using Aquacel Ag 12/13/15:  No major change this week. Using Aquacel AG 621/17; arrived this week with maceration around the wound. There was quite a bit of undermining which required surgical debridement. I changed him to [facility] last week, by the patient's admission he was up on this more this week 12/27/15; macerated tissue around the wound is removed with a  scalpel and pickups. There is no undermining. Nonviable subcutaneous tissue and skin taken from the superior circumference of the wound is slough from the surface. READMISSION 03/06/16 since I last saw this patient at the end of June, he went for surgery on 01/11/16 by Dr. Doran Durand of orthopedics. He had a left foot irrigation and debridement, removal of hardware and placement of wound VAC. He is also been followed by Dr. Megan Salon of infectious disease and completed a six-week course of IV Rocephin for group B strep and Enterobacter in the bone at the time of surgery. Apparently at the time of surgery the bone looked healthy so I don't think any bone was actually removed. He has been using silver alginate based dressings on the same wound area at the base of the left fifth metatarsal on the left. I note that he is also had arterial studies on 01/08/16, these showed a left ABI of 1.2 to and a right ABI of 1.3. JACODY, BENEKE ([id number]) Waveforms were listed as biphasic. He was not felt to have any specific arterial issues. 03/13/16; no real change in the condition of the wound at the left lateral foot at roughly the base of his fifth metatarsal. Use silver alginate last week. 03/18/16 arrives today with no open area. Being suspicious of the overlying callus I. Some of this back although I see nothing but covering tissue here/epithelium. There is no surrounding tenderness READMISSION 04/16/16 this is a patient I discharged about a month ago. Initially a surgical wound on the lateral aspect of his left foot which subsequently became infected. The story he is giving today that he went back into his own modified shoe started to notice pain 2 weeks ago he was seen in Dr. Nona Dell office by a physician assistant last Wednesday and according the patient was told that everything looked fine however this is clearly now broken open and he has an open wound in the same spot that we have been dealing with  repetitively. Situation is complicated by the fact that he is running short of money on long-term disability. He has not taken his insulin and at least 2 weeks was previously on NovoLog short acting insulin on a sliding scale and TRESIBA 35 units at bedtime. He is no longer able to afford any of his medication he was in the x-ray on 10/15. A plain x-ray showed healed fracture of the left fifth metatarsal bone status post removal of the associated plate and screw fixation hardware. There was no acute appearing osseous abnormality. His blood work showed a white count of 9.2 with an essentially normal differential comprehensive metabolic panel was normal. Previous CT scan of the foot on 01/08/16 showed no osteomyelitis previous vascular workup showed no evidence of significant PAD on 01/13/16 04/23/16; culture I did last week grew enterococcus [ampicillin sensitive] and MRSA. He saw infectious disease yesterday. They stop the clindamycin and ordered an MRI. This is not unreasonable. All the hardware is out of the foot at this point. 04/30/16 at this point in time we are still awaiting the results of the MRI at this point in time. Patient did have an area which  appears to be somewhat macerated in the proximal portion of the wound where there is overlying necrotic skin that is doing nothing more than trapping fluid underneath. He continues to state that he does have some discomfort and again the concern is for the possibility of osteomyelitis hence the reason for the MRI order. We have been using a silver alginate dressing but again I think the reason this with his macerated as it was is that the dressing obscene could not reach the entirety of the wound due to some necrotic skin covering the proximal portion. No bleeding noted at this point in time on initial evaluation. 05/07/16 we receive the results of patient's MRI which shows that he has no evidence of osteomyelitis. This obviously is excellent news.  Patient was definitely happy to hear this. He tells me the wound appears to be doing very well at this point in time and is pleased with the progress currently. 05/15/16; as noted the patient did not have osteomyelitis. He has been released by infectious disease and orthopedics. His wound is still open he had a debridement last week but complain when he got home he "bleeding for half a day". He is not had any pain. We have been using silver alginate with Kerlix gauze wrap. 05/28/16; patient arrives today with the wound in much the same condition as last time. He has a small opening on the lateral aspect of his foot however with debridement there is clear undermining medially there is no real evidence of infection here and I didn't see any point in culturing this. One would have to wonder if this isn't a simple matter of in adequate offloading as he is been using a healing sandal. 06/05/16; the open area is now on the plantar aspect of his foot and not decide. The wound almost appears to have "migrated". This was the term use by our intake nurse. 06/12/16; open area on the plantar aspect of his foot. Base of the wound looks very healthy. This will be his second week in a total contact cast 06/19/16; patient arrived today in a total contact cast. There was some expectation from our staff and myself that this area would be healed. Unfortunately the area was boggy and with rec pressure a fairly substantial amount of purulent drainage was obtained. Specimen obtained for culture. The patient had no complaints of systemic problems including fever or chills or instability of his diabetes. There was no pain in the foot. Nevertheless a extensive debridement was required. 06/26/16; patient's culture from the abscess last week grew a combination of MRSA and ampicillin sensitive enterococcus. I had him on Augmentin and Septra however I have elected to give him a full 10 day course of Zyvox instead as I  Recently treated this combination of organisms with Augmentin and Septra before. Arrives today with no systemic symptoms 07/03/16; the patient has 2 more treatments of Zyvox and then he is finished antibiotics the wound has improved now mostly on the lateral aspect of his foot. There is still some tenderness when he walks. 07/10/16; patient arrives today with Zyvox completed. He only has a small open area remaining. 07/24/16; she arrives here with no overt open area. The covering is thick callus/eschar. Nevertheless there is no open area here. He has some tenderness underneath the area but no overt infection is observed no drainage. The patient has a deformity in the foot with this area weekly to be exposed to more pressure in his foot where nevertheless that something  we are going to have to deal with going forward. The patient has diabetic workboots and diabetic shoes. He has had a long difficult course with the area here. This started as a fracture at work. He had bone grafting from his calcaneus and screws. This got infected he had to have more surgery on the area. Bone at the time of that surgery I think showed enterococcus and group B strep. He had 6 weeks of Rocephin. Since then the area has waxed and waned in its difficulty. Recently he had an ODELL, CHOUNG. ([id number]) MRI in December that did not show osteomyelitis nevertheless he had an abscess that grew MRSA and enterococcus which I elected to treat with Zyvox. This was in December and this wound is actually "healed" over 08/21/16; the patient came in today for his one-month follow-up visit. The area on the lateral aspect of his left foot looks much the same as some month ago. There is no evidence of an open wound here. However the patient tells me about a week after he went back to work he developed severe pain and swelling in the plantar aspect of his right foot first and fifth metatarsal heads. He has had wounds in the right foot before  in fact seems to have had a interphalangeal joint amputation of the right toe. He went to see his podiatrist at [facility]. She told him that he could not work on his feet. She told him to go back and his cam walking boot on the left. Not have open wounds obviously on the right. The patient is actually gone ahead and retired from his job because he does not feel he can work on his feet 09/18/16; patient comes back in saying he recently had some pain on the lateral aspect of his left foot. Asked that we look at this. Other than that all of his wounds have a viable surface. He has diabetic shoes. He is retired from work. READMISSION Jeovani Weisenburger is a 56 year old man who we cared for for a prolonged period of time in late 2017 into March 2018. At that point he had suffered a fracture of his left fifth metatarsal at about the level of the base of the fifth metatarsal. He had surgical repair by Dr. Meryl Dare and he developed a very refractory wound over the fifth metatarsal. Ultimately this became infected he required hardware removal this eventually closed over and we discharged from the clinic in March of this year. He tells me as well until roughly 3 weeks ago when he developed increasing pain in the same area making it difficult for him to sleep. He was admitted to hospital from 9/5 through 9/7 with now an ulcer in the same area on the lateral aspect of his left foot at the level of the base of the metatarsal. X-rays and MRIs during this hospitalization were negative. Wound culture showed a combination of Escherichia coli, Morganella and Pseudomonas. He was given IV Cipro and vancomycin the hospital and then discharged home on Cipro and Doxy. He returned to the ER on 9/16 with leg swelling and discomfort. I don't think anything was added at that point although he did have an ultrasound of the left leg that was negative for DVT. He was back in the ER on 10/4 again with pain in the area he was  given Bactrim but states he had a significant allergic reaction to that which has abated once he stopped the Bactrim. I don't think he had a  full course. He went to see his podiatrist yesterday who recommended some form of foot soak. He has a Darco forefoot offloading boot The patient is a type II diabetic on insulin. Tells me his recent hemoglobin A1c was 7. Arterial studies done in July 2017 showed an ABI in the right of 1.3 on the left 1.2 to biphasic waveforms bilaterally. He was not felt to have arterial disease. As noted the patient has had 2 MRIs most recently on 10/4 this showed no evidence of an abscess or osteomyelitis and no change since the prior study of 03/05/17 there was nonspecific edema on the dorsum of the foot. ABIs in this clinic today 1.2 which would be unchanged from his study done in July/17 04/15/17; now down to 1 small wound on the left lateral foot. We wrapped him and applied collagen last week and the foot is fairly macerated. 04/22/17; small wound on the left lateral foot however it has some undermining. Using collagen 04/29/17; small wound on the left lateral foot some surrounding callus. Chronic damage in this area as a result of initial fracture nonhealing and secondary infection. Electronic Signature(s) Signed: 04/29/2017 4:48:36 PM By: [name] MD Entered By: [name] on 04/29/2017 09:19:26 [name] ([id number]) -------------------------------------------------------------------------------- Physical Exam Details Patient Name: CARRICK, RIJOS. Date of Service: 04/29/2017 8:45 AM Medical Record Number: [id number] Patient Account Number: [id number] Date of Birth/Sex: 10-Dec-1960 (56 y.o. Male) Treating RN: Ahmed Prima Primary Care Provider: Lorelee Market Other Clinician: Referring Provider: Lorelee Market Treating Provider/Extender: Ricard Dillon Weeks in Treatment: 3 Constitutional Patient is hypertensive.. Pulse regular  and within target range for patient.Marland Kitchen Respirations regular, non-labored and within target range.. Temperature is normal and within the target range for the patient.Marland Kitchen appears in no distress. Eyes Conjunctivae clear. No discharge. Respiratory Respiratory effort is easy and symmetric bilaterally. Rate is normal at rest and on room air.. Cardiovascular Pedal pulses palpable and strong bilaterally.. Lymphatic None palpable in the left popliteal or inguinal area. Psychiatric No evidence of depression, anxiety, or agitation. Calm, cooperative, and communicative. Appropriate interactions and affect.. Notes Wound exam; still a small open area however the undermining seems to be better. I did not debride this today. I'm going to see what another week that is here however if he has a close surface I will make sure that this is a viable surface next week. There is no evidence of surrounding infection chronic deformity in the area related to his previous underlying trauma Electronic Signature(s) Signed: 04/29/2017 4:48:36 PM By: [name] MD Entered By: [name] on 04/29/2017 09:20:51 [name] ([id number]) -------------------------------------------------------------------------------- Physician Orders Details Patient Name: GRECO, GASTELUM. Date of Service: 04/29/2017 8:45 AM Medical Record Number: [id number] Patient Account Number: [id number] Date of Birth/Sex: 1961-02-08 (56 y.o. Male) Treating RN: Ahmed Prima Primary Care Provider: Lorelee Market Other Clinician: Referring Provider: Lorelee Market Treating Provider/Extender: Tito Dine in Treatment: 3 Verbal / Phone Orders: Yes Clinician: Carolyne Fiscal, Debi Read Back and Verified: Yes Diagnosis Coding Wound Cleansing Wound #9 Left,Lateral Foot o Clean wound with wound cleanser. o Cleanse wound with mild soap and water Anesthetic Wound #9 Left,Lateral Foot o Topical Lidocaine 4% cream  applied to wound bed prior to debridement Primary Wound Dressing Wound #9 Left,Lateral Foot o Silvercel Non-Adherent Secondary Dressing Wound #9 Left,Lateral Foot o Dry Gauze o Conform/Kerlix o Foam Dressing Change Frequency Wound #9 Left,Lateral Foot o Change dressing every day. Follow-up Appointments Wound #9 Left,Lateral Foot o Return Appointment in 1  week. Edema Control Wound #9 Left,Lateral Foot o Elevate legs to the level of the heart and pump ankles as often as possible Additional Orders / Instructions Wound #9 Left,Lateral Foot o Increase protein intake. Electronic Signature(s) Signed: 04/29/2017 4:48:36 PM By: [name] MD Signed: 04/30/2017 4:35:14 PM By: Alric Quan Entered By: Alric Quan on 04/29/2017 09:11:59 JOS, CYGAN ([name] ([id number]) -------------------------------------------------------------------------------- Problem List Details Patient Name: JORDANY, RUSSETT. Date of Service: 04/29/2017 8:45 AM Medical Record Number: [id number] Patient Account Number: [id number] Date of Birth/Sex: March 22, 1961 (56 y.o. Male) Treating RN: Ahmed Prima Primary Care Provider: Lorelee Market Other Clinician: Referring Provider: Lorelee Market Treating Provider/Extender: Tito Dine in Treatment: 3 Active Problems ICD-10 Encounter Code Description Active Date Diagnosis E11.621 Type 2 diabetes mellitus with foot ulcer 04/08/2017 Yes L97.521 Non-pressure chronic ulcer of other part of left foot limited to 04/08/2017 Yes breakdown of skin Inactive Problems Resolved Problems Electronic Signature(s) Signed: 04/29/2017 4:48:36 PM By: [name] MD Entered By: [name] on 04/29/2017 09:18:33 [name] ([id number]) -------------------------------------------------------------------------------- Progress Note Details Patient Name: [name]. Date of Service: 04/29/2017  8:45 AM Medical Record Number: [id number] Patient Account Number: [id number] Date of Birth/Sex: 24-Jan-1961 (56 y.o. Male) Treating RN: Ahmed Prima Primary Care Provider: Lorelee Market Other Clinician: Referring Provider: Lorelee Market Treating Provider/Extender: Ricard Dillon Weeks in Treatment: 3 Subjective History of Present Illness (HPI) Pleasant 56 year old with history of diabetes (Hgb A1c 10.8 in 2014) and peripheral neuropathy. No PVD. L ABI 1.1. Status post right great toe partial amputation years ago. He was at work and on 10/22/2014, was injured by a cart, and suffered an ulceration to his left anterior calf. He says that it subsequently became infected, and he was treated with a course of antibiotics. He was found on initial exam to have an ulceration on the dorsum of his left third toe. He was unaware of this and attributes it to pressure from his steel toed boots. More recently he injured his right anterior calf on a cart. Ambulating normally per his baseline. He has been undergoing regular debridements, applying mupirocin cream, and an Ace wrap for edema control. He returns to clinic for follow-up and is without complaints. No pain. No fever or chills. No drainage. 10/25/15; this is a 56 year old man who has type II diabetes with diabetic polyneuropathy. He tells me that he fractured his left fifth metatarsal in June 2016 when he presented with swelling. He does not recall a specific injury. His hemoglobin A1c was apparently too high at the time for consideration of surgery and he was put in some form of offloading. Ultimately he went to surgery in December with an allograft from his calcaneus to this site, plate and screws. He had an x-ray of the foot in March that showed concerns about nonunion. He tells me that in March he had to move and basically moved himself. He was on his foot a lot and then noticed some drainage from an open area. He has been following  with his orthopedic surgeon Dr. Doran Durand. He has been applying a felt donut, dry dressing and using his heel healing sandal. 11/01/15; this is a patient I saw last week for the first time. He had a small open wound on the plantar aspect of his left foot at roughly the level of the base of his fifth metatarsal. He had a considerable degree of thickened skin around this wound on the plantar aspect which I thought was from  chronic pressure on this area. He tells [place] that he had drainage over the course of the week. No systemic symptoms. 11/08/15; culture last week grew Citrobacter korseri. This should've been sensitive to the Augmentin I gave him. He has seen Dr. Doran Durand who did his initial surgery and according the patient the plan is to give this another month and then the hardware might need to come out of this. This seems like a reasonable plan. I will adjust his antibiotics to ciprofloxacin which probably should continue for at least another 2 weeks. I gave him 10 days worth today 11/22/15 the patient has completed antibiotics. He has an appointment with Dr. Doran Durand this Friday. There is improved dimensions around the wound on the left fifth metatarsal base 11/29/15; the patient has completed antibiotics last week. Apparently his appointment with Dr. Doran Durand it is not until this Friday. Dimensions are roughly the same. 12/06/15; saw Dr. Doran Durand. No x-ray told the end of the month, next appointment June 30. We have been using Aquacel Ag 12/13/15: No major change this week. Using Aquacel AG 621/17; arrived this week with maceration around the wound. There was quite a bit of undermining which required surgical debridement. I changed him to [facility] last week, by the patient's admission he was up on this more this week 12/27/15; macerated tissue around the wound is removed with a scalpel and pickups. There is no undermining. Nonviable subcutaneous tissue and skin taken from the superior circumference of the  wound is slough from the surface. READMISSION 03/06/16 since I last saw this patient at the end of June, he went for surgery on 01/11/16 by Dr. Doran Durand of orthopedics. He had a left foot irrigation and debridement, removal of hardware and placement of wound VAC. He is also been followed by Dr. Megan Salon of infectious disease and completed a six-week course of IV Rocephin for group B strep and Enterobacter in the bone at the time of surgery. Apparently at the time of surgery the bone looked healthy so I don't think any bone was actually removed. He has been using silver alginate based dressings on the same wound area at the base of the left fifth metatarsal on the left. I note that he is also had arterial studies on 01/08/16, these showed a left ABI of 1.2 to and a right ABI of 1.3. Waveforms were listed as biphasic. He was not felt to have any specific arterial issues. 03/13/16; no real change in the condition of the wound at the left lateral foot at roughly the base of his fifth metatarsal. Use EATHEN, BUDREAU. ([id number]) silver alginate last week. 03/18/16 arrives today with no open area. Being suspicious of the overlying callus I. Some of this back although I see nothing but covering tissue here/epithelium. There is no surrounding tenderness READMISSION 04/16/16 this is a patient I discharged about a month ago. Initially a surgical wound on the lateral aspect of his left foot which subsequently became infected. The story he is giving today that he went back into his own modified shoe started to notice pain 2 weeks ago he was seen in Dr. Nona Dell office by a physician assistant last Wednesday and according the patient was told that everything looked fine however this is clearly now broken open and he has an open wound in the same spot that we have been dealing with repetitively. Situation is complicated by the fact that he is running short of money on long-term disability. He has not taken his insulin  and at least 2 weeks was previously on NovoLog short acting insulin on a sliding scale and TRESIBA 35 units at bedtime. He is no longer able to afford any of his medication he was in the x-ray on 10/15. A plain x-ray showed healed fracture of the left fifth metatarsal bone status post removal of the associated plate and screw fixation hardware. There was no acute appearing osseous abnormality. His blood work showed a white count of 9.2 with an essentially normal differential comprehensive metabolic panel was normal. Previous CT scan of the foot on 01/08/16 showed no osteomyelitis previous vascular workup showed no evidence of significant PAD on 01/13/16 04/23/16; culture I did last week grew enterococcus [ampicillin sensitive] and MRSA. He saw infectious disease yesterday. They stop the clindamycin and ordered an MRI. This is not unreasonable. All the hardware is out of the foot at this point. 04/30/16 at this point in time we are still awaiting the results of the MRI at this point in time. Patient did have an area which appears to be somewhat macerated in the proximal portion of the wound where there is overlying necrotic skin that is doing nothing more than trapping fluid underneath. He continues to state that he does have some discomfort and again the concern is for the possibility of osteomyelitis hence the reason for the MRI order. We have been using a silver alginate dressing but again I think the reason this with his macerated as it was is that the dressing obscene could not reach the entirety of the wound due to some necrotic skin covering the proximal portion. No bleeding noted at this point in time on initial evaluation. 05/07/16 we receive the results of patient's MRI which shows that he has no evidence of osteomyelitis. This obviously is excellent news. Patient was definitely happy to hear this. He tells me the wound appears to be doing very well at this point in time and is pleased with the  progress currently. 05/15/16; as noted the patient did not have osteomyelitis. He has been released by infectious disease and orthopedics. His wound is still open he had a debridement last week but complain when he got home he "bleeding for half a day". He is not had any pain. We have been using silver alginate with Kerlix gauze wrap. 05/28/16; patient arrives today with the wound in much the same condition as last time. He has a small opening on the lateral aspect of his foot however with debridement there is clear undermining medially there is no real evidence of infection here and I didn't see any point in culturing this. One would have to wonder if this isn't a simple matter of in adequate offloading as he is been using a healing sandal. 06/05/16; the open area is now on the plantar aspect of his foot and not decide. The wound almost appears to have "migrated". This was the term use by our intake nurse. 06/12/16; open area on the plantar aspect of his foot. Base of the wound looks very healthy. This will be his second week in a total contact cast 06/19/16; patient arrived today in a total contact cast. There was some expectation from our staff and myself that this area would be healed. Unfortunately the area was boggy and with rec pressure a fairly substantial amount of purulent drainage was obtained. Specimen obtained for culture. The patient had no complaints of systemic problems including fever or chills or instability of his diabetes. There was no pain in the foot.  Nevertheless a extensive debridement was required. 06/26/16; patient's culture from the abscess last week grew a combination of MRSA and ampicillin sensitive enterococcus. I had him on Augmentin and Septra however I have elected to give him a full 10 day course of Zyvox instead as I Recently treated this combination of organisms with Augmentin and Septra before. Arrives today with no systemic symptoms 07/03/16; the patient has 2  more treatments of Zyvox and then he is finished antibiotics the wound has improved now mostly on the lateral aspect of his foot. There is still some tenderness when he walks. 07/10/16; patient arrives today with Zyvox completed. He only has a small open area remaining. 07/24/16; she arrives here with no overt open area. The covering is thick callus/eschar. Nevertheless there is no open area here. He has some tenderness underneath the area but no overt infection is observed no drainage. The patient has a deformity in the foot with this area weekly to be exposed to more pressure in his foot where nevertheless that something we are going to have to deal with going forward. The patient has diabetic workboots and diabetic shoes. He has had a long difficult course with the area here. This started as a fracture at work. He had bone grafting from his calcaneus and screws. This got infected he had to have more surgery on the area. Bone at the time of that surgery I think showed enterococcus and group B strep. He had 6 weeks of Rocephin. Since then the area has waxed and waned in its difficulty. Recently he had an MRI in December that did not show osteomyelitis nevertheless he had an abscess that grew MRSA and enterococcus which I elected to treat with Zyvox. This was in December and this wound is actually "healed" over KIERRE, HINTZ ([id number]) 08/21/16; the patient came in today for his one-month follow-up visit. The area on the lateral aspect of his left foot looks much the same as some month ago. There is no evidence of an open wound here. However the patient tells me about a week after he went back to work he developed severe pain and swelling in the plantar aspect of his right foot first and fifth metatarsal heads. 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 He went to see his podiatrist at [facility]. She told him  that he could not work on his feet. She told him to go back and his cam walking boot on the left. Not have open wounds obviously on the right. The patient is actually gone ahead and retired from his job because he does not feel he can work on his feet 09/18/16; patient comes back in saying he recently had some pain on the lateral aspect of his left foot. Asked that we look at this. Other than that all of his wounds have a viable surface. He has diabetic shoes. He is retired from work. READMISSION Kirill Chatterjee is a 56 year old man who we cared for for a prolonged period of time in late 2017 into March 2018. At that point he had suffered a fracture of his left fifth metatarsal at about the level of the base of the fifth metatarsal. He had surgical repair by Dr. Meryl Dare and he developed a very refractory wound over the fifth metatarsal. Ultimately this became infected he required hardware removal this eventually closed over and we discharged from the clinic in March of  this year. He tells me as well until roughly 3 weeks ago when he developed increasing pain in the same area making it difficult for him to sleep. He was admitted to hospital from 9/5 through 9/7 with now an ulcer in the same area on the lateral aspect of his left foot at the level of the base of the metatarsal. X-rays and MRIs during this hospitalization were negative. Wound culture showed a combination of Escherichia coli, Morganella and Pseudomonas. He was given IV Cipro and vancomycin the hospital and then discharged home on Cipro and Doxy. He returned to the ER on 9/16 with leg swelling and discomfort. I don't think anything was added at that point although he did have an ultrasound of the left leg that was negative for DVT. He was back in the ER on 10/4 again with pain in the area he was given Bactrim but states he had a significant allergic reaction to that which has abated once he stopped the Bactrim. I don't think he had a full  course. He went to see his podiatrist yesterday who recommended some form of foot soak. He has a Darco forefoot offloading boot The patient is a type II diabetic on insulin. Tells me his recent hemoglobin A1c was 7. Arterial studies done in July 2017 showed an ABI in the right of 1.3 on the left 1.2 to biphasic waveforms bilaterally. He was not felt to have arterial disease. As noted the patient has had 2 MRIs most recently on 10/4 this showed no evidence of an abscess or osteomyelitis and no change since the prior study of 03/05/17 there was nonspecific edema on the dorsum of the foot. ABIs in this clinic today 1.2 which would be unchanged from his study done in July/17 04/15/17; now down to 1 small wound on the left lateral foot. We wrapped him and applied collagen last week and the foot is fairly macerated. 04/22/17; small wound on the left lateral foot however it has some undermining. Using collagen 04/29/17; small wound on the left lateral foot some surrounding callus. Chronic damage in this area as a result of initial fracture nonhealing and secondary infection. Objective Constitutional Patient is hypertensive.. Pulse regular and within target range for patient.Marland Kitchen Respirations regular, non-labored and within target range.. Temperature is normal and within the target range for the patient.Marland Kitchen appears in no distress. Vitals Time Taken: 8:43 AM, Height: 70 in, Weight: 272 lbs, BMI: 39, Temperature: 98.4 F, Pulse: 102 bpm, Respiratory Rate: 18 breaths/min, Blood Pressure: 156/87 mmHg. Eyes Conjunctivae clear. No discharge. ALEJOS, REINHARDT ([id number]) Respiratory Respiratory effort is easy and symmetric bilaterally. Rate is normal at rest and on room air.. Cardiovascular Pedal pulses palpable and strong bilaterally.. Lymphatic None palpable in the left popliteal or inguinal area. Psychiatric No evidence of depression, anxiety, or agitation. Calm, cooperative, and communicative. Appropriate  interactions and affect.. General Notes: Wound exam; still a small open area however the undermining seems to be better. I did not debride this today. I'm going to see what another week that is here however if he has a close surface I will make sure that this is a viable surface next week. There is no evidence of surrounding infection chronic deformity in the area related to his previous underlying trauma Integumentary (Hair, Skin) Wound #9 status is Open. Original cause of wound was Gradually Appeared. The wound is located on the Left,Lateral Foot. The wound measures 0.5cm length x 0.3cm width x 0.2cm depth; 0.118cm^2 area and 0.024cm^3 volume.  There is no tunneling or undermining noted. There is a large amount of serous drainage noted. The wound margin is distinct with the outline attached to the wound base. There is small (1-33%) pink granulation within the wound bed. There is a large (67-100%) amount of necrotic tissue within the wound bed including Eschar and Adherent Slough. The periwound skin appearance exhibited: Maceration. Periwound temperature was noted as No Abnormality. The periwound has tenderness on palpation. 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 Cleanse wound with mild soap and water Anesthetic: Wound #9 Left,Lateral Foot: Topical Lidocaine 4% cream applied to wound bed prior to debridement Primary Wound Dressing: Wound #9 Left,Lateral Foot: Silvercel Non-Adherent Secondary Dressing: Wound #9 Left,Lateral Foot: Dry Gauze JAYVIER, BURGHER. ([id number]) Conform/Kerlix Foam Dressing Change Frequency: Wound #9 Left,Lateral Foot: Change dressing every day. Follow-up Appointments: Wound #9 Left,Lateral Foot: Return Appointment in 1 week. Edema  Control: Wound #9 Left,Lateral Foot: Elevate legs to the level of the heart and pump ankles as often as possible Additional Orders / Instructions: Wound #9 Left,Lateral Foot: Increase protein intake. #1 silver alginate, Kerlix, con form #2 careful observation of this wound next week. If it is closed the surface will likely need some debridement to make sure that this is a "healthy closure" not just due to callus. Also oundermining if it does not closed Electronic Signature(s) Signed: 04/29/2017 4:48:36 PM By: [name] MD Entered By: [name] on 04/29/2017 09:21:54 [name] ([id number]) -------------------------------------------------------------------------------- [place] Details Patient Name: [name]. Date of Service: 04/29/2017 Medical Record Number: [id number] Patient Account Number: [id number] Date of Birth/Sex: January 29, 1961 (56 y.o. Male) Treating RN: Ahmed Prima Primary Care Provider: Lorelee Market Other Clinician: Referring Provider: Lorelee Market Treating Provider/Extender: Ricard Dillon Weeks in Treatment: 3 Diagnosis Coding ICD-10 Codes Code Description E11.621 Type 2 diabetes mellitus with foot ulcer L97.521 Non-pressure chronic ulcer of other part of left foot limited to breakdown of skin Facility Procedures CPT4 Code: [id number] Description: 99213 - WOUND CARE VISIT-LEV 3 EST PT Modifier: Quantity: 1 Physician Procedures CPT4 Code Description: [id number] - WC PHYS LEVEL 3 - EST PT ICD-10 Diagnosis Description E11.621 Type 2 diabetes mellitus with foot ulcer L97.521 Non-pressure chronic ulcer of other part of left foot limited t Modifier: o breakdown of sk Quantity: 1 in Electronic Signature(s) Signed: 04/29/2017 10:21:14 AM By: Alric Quan Signed: 04/29/2017 4:48:36 PM By: [name] MD Entered By: Alric Quan on 04/29/2017 10:21:13

## 2017-05-06 ENCOUNTER — Encounter: Payer: Commercial Managed Care - PPO | Attending: Internal Medicine | Admitting: Internal Medicine

## 2017-05-06 DIAGNOSIS — E1142 Type 2 diabetes mellitus with diabetic polyneuropathy: Secondary | ICD-10-CM | POA: Diagnosis not present

## 2017-05-06 DIAGNOSIS — E11621 Type 2 diabetes mellitus with foot ulcer: Secondary | ICD-10-CM | POA: Insufficient documentation

## 2017-05-06 DIAGNOSIS — I10 Essential (primary) hypertension: Secondary | ICD-10-CM | POA: Insufficient documentation

## 2017-05-06 DIAGNOSIS — L97521 Non-pressure chronic ulcer of other part of left foot limited to breakdown of skin: Secondary | ICD-10-CM | POA: Diagnosis not present

## 2017-05-06 DIAGNOSIS — Z794 Long term (current) use of insulin: Secondary | ICD-10-CM | POA: Insufficient documentation

## 2017-05-11 NOTE — Progress Notes (Signed)
EYOB, GODLEWSKI ([id number]) Visit Report for 05/06/2017 Arrival Information Details Patient Name: [name], [name]. Date of Service: 05/06/2017 8:45 AM Medical Record Number: [id number] Patient Account Number: [id number] Date of Birth/Sex: 13-Apr-1961 (56 y.o. Male) Treating RN: Ahmed Prima Primary Care Taeshawn Helfman: Lorelee Market Other Clinician: Referring Phenix Grein: Lorelee Market Treating Helina Hullum/Extender: Tito Dine in Treatment: 4 Visit Information History Since Last Visit All ordered tests and consults were completed: No Patient Arrived: Ambulatory Added or deleted any medications: No Arrival Time: 08:44 Any new allergies or adverse reactions: No Accompanied By: self Had a fall or experienced change in No Transfer Assistance: None activities of daily living that may affect Patient Identification Verified: Yes risk of falls: Secondary Verification Process Completed: Yes Signs or symptoms of abuse/neglect since last visito No Patient Requires Transmission-Based No Hospitalized since last visit: No Precautions: Has Dressing in Place as Prescribed: Yes Patient Has Alerts: Yes Pain Present Now: No Patient Alerts: DM II Electronic Signature(s) Signed: 05/09/2017 4:36:47 PM By: Alric Quan Entered By: Alric Quan on 05/06/2017 08:46:12 [name] ([id number]) -------------------------------------------------------------------------------- Clinic Level of Care Assessment Details Patient Name: [name]. Date of Service: 05/06/2017 8:45 AM Medical Record Number: [id number] Patient Account Number: [id number] Date of Birth/Sex: January 25, 1961 (56 y.o. Male) Treating RN: Ahmed Prima Primary Care Joandry Slagter: Lorelee Market Other Clinician: Referring Karlea Mckibbin: Lorelee Market Treating Birch Farino/Extender: Tito Dine in Treatment: 4 Clinic Level of Care Assessment Items TOOL 4 Quantity Score X - Use when only an EandM is  performed on FOLLOW-UP visit 1 0 ASSESSMENTS - Nursing Assessment / Reassessment X - Reassessment of Co-morbidities (includes updates in patient status) 1 10 X- 1 5 Reassessment of Adherence to Treatment Plan ASSESSMENTS - Wound and Skin Assessment / Reassessment X - Simple Wound Assessment / Reassessment - one wound 1 5 []  - 0 Complex Wound Assessment / Reassessment - multiple wounds []  - 0 Dermatologic / Skin Assessment (not related to wound area) ASSESSMENTS - Focused Assessment []  - Circumferential Edema Measurements - multi extremities 0 []  - 0 Nutritional Assessment / Counseling / Intervention []  - 0 Lower Extremity Assessment (monofilament, tuning fork, pulses) []  - 0 Peripheral Arterial Disease Assessment (using hand held doppler) ASSESSMENTS - Ostomy and/or Continence Assessment and Care []  - Incontinence Assessment and Management 0 []  - 0 Ostomy Care Assessment and Management (repouching, etc.) PROCESS - Coordination of Care X - Simple Patient / Family Education for ongoing care 1 15 []  - 0 Complex (extensive) Patient / Family Education for ongoing care []  - 0 Staff obtains Programmer, systems, Records, Test Results / Process Orders []  - 0 Staff telephones HHA, Nursing Homes / Clarify orders / etc []  - 0 Routine Transfer to another Facility (non-emergent condition) []  - 0 Routine Hospital Admission (non-emergent condition) []  - 0 New Admissions / Biomedical engineer / Ordering NPWT, Apligraf, etc. []  - 0 Emergency Hospital Admission (emergent condition) X- 1 10 Simple Discharge Coordination [name], [name]. ([id number]) []  - 0 Complex (extensive) Discharge Coordination PROCESS - Special Needs []  - Pediatric / Minor Patient Management 0 []  - 0 Isolation Patient Management []  - 0 Hearing / Language / Visual special needs []  - 0 Assessment of Community assistance (transportation, D/C planning, etc.) []  - 0 Additional assistance / Altered mentation []  - 0 Support  Surface(s) Assessment (bed, cushion, seat, etc.) INTERVENTIONS - Wound Cleansing / Measurement X - Simple Wound Cleansing - one wound 1 5 []  - 0 Complex Wound Cleansing - multiple wounds X- 1  5 Wound Imaging (photographs - any number of wounds) []  - 0 Wound Tracing (instead of photographs) []  - 0 Simple Wound Measurement - one wound []  - 0 Complex Wound Measurement - multiple wounds INTERVENTIONS - Wound Dressings []  - Small Wound Dressing one or multiple wounds 0 []  - 0 Medium Wound Dressing one or multiple wounds []  - 0 Large Wound Dressing one or multiple wounds []  - 0 Application of Medications - topical []  - 0 Application of Medications - injection INTERVENTIONS - Miscellaneous []  - External ear exam 0 []  - 0 Specimen Collection (cultures, biopsies, blood, body fluids, etc.) []  - 0 Specimen(s) / Culture(s) sent or taken to Lab for analysis []  - 0 Patient Transfer (multiple staff / Civil Service fast streamer / Similar devices) []  - 0 Simple Staple / Suture removal (25 or less) []  - 0 Complex Staple / Suture removal (26 or more) []  - 0 Hypo / Hyperglycemic Management (close monitor of Blood Glucose) []  - 0 Ankle / Brachial Index (ABI) - do not check if billed separately X- 1 5 Vital Signs [name], [name]. ([id number]) Has the patient been seen at the hospital within the last three years: Yes Total Score: 60 Level Of Care: New/Established - Level 2 Electronic Signature(s) Signed: 05/09/2017 4:36:47 PM By: Alric Quan Entered By: Alric Quan on 05/06/2017 10:04:59 [name] ([id number]) -------------------------------------------------------------------------------- Encounter Discharge Information Details Patient Name: [name]. Date of Service: 05/06/2017 8:45 AM Medical Record Number: [id number] Patient Account Number: [id number] Date of Birth/Sex: 06-03-1961 (56 y.o. Male) Treating RN: Ahmed Prima Primary Care Glendine Swetz: Lorelee Market Other  Clinician: Referring Milo Solana: Lorelee Market Treating Saud Bail/Extender: Tito Dine in Treatment: 4 Encounter Discharge Information Items Discharge Pain Level: 0 Discharge Condition: Stable Ambulatory Status: Ambulatory Discharge Destination: Home Transportation: Private Auto Accompanied By: self Schedule Follow-up Appointment: No Medication Reconciliation completed and No provided to Patient/Care Kamile Fassler: Patient Clinical Summary of Care: Declined Electronic Signature(s) Signed: 05/08/2017 10:05:11 AM By: Ruthine Dose Entered By: Ruthine Dose on 05/06/2017 09:10:25 [name] ([id number]) -------------------------------------------------------------------------------- Lower Extremity Assessment Details Patient Name: [name]. Date of Service: 05/06/2017 8:45 AM Medical Record Number: [id number] Patient Account Number: [id number] Date of Birth/Sex: 1961-04-05 (56 y.o. Male) Treating RN: Ahmed Prima Primary Care Nitya Cauthon: Lorelee Market Other Clinician: Referring Fable Huisman: Lorelee Market Treating Naksh Radi/Extender: Ricard Dillon Weeks in Treatment: 4 Vascular Assessment Pulses: Dorsalis Pedis Palpable: [Left:No] Doppler Audible: [Left:Yes] Posterior Tibial Extremity colors, hair growth, and conditions: Extremity Color: [Left:Hyperpigmented] Temperature of Extremity: [Left:Warm] Capillary Refill: [Left:> 3 seconds] Toe Nail Assessment Left: Right: Thick: Yes Discolored: Yes Deformed: No Improper Length and Hygiene: No Electronic Signature(s) Signed: 05/09/2017 4:36:47 PM By: Alric Quan Entered By: Alric Quan on 05/06/2017 09:01:52 [name] ([id number]) -------------------------------------------------------------------------------- Multi Wound Chart Details Patient Name: [name]. Date of Service: 05/06/2017 8:45 AM Medical Record Number: [id number] Patient Account Number: [id number] Date of  Birth/Sex: 1960/08/19 (56 y.o. Male) Treating RN: Ahmed Prima Primary Care Cortana Vanderford: Lorelee Market Other Clinician: Referring Corissa Oguinn: Lorelee Market Treating Baby Gieger/Extender: Ricard Dillon Weeks in Treatment: 4 Vital Signs Height(in): 70 Pulse(bpm): 94 Weight(lbs): 272 Blood Pressure(mmHg): 152/89 Body Mass Index(BMI): 39 Temperature(F): 98.1 Respiratory Rate 18 (breaths/min): Photos: [N/A:N/A] Wound Location: Left, Lateral Foot N/A N/A Wounding Event: Gradually Appeared N/A N/A Primary Etiology: Diabetic Wound/Ulcer of the N/A N/A Lower Extremity Date Acquired: 03/18/2017 N/A N/A Weeks of Treatment: 4 N/A N/A Wound Status: Healed - Epithelialized N/A N/A Measurements L x W x D 0x0x0 N/A  N/A (cm) Area (cm) : 0 N/A N/A Volume (cm) : 0 N/A N/A % Reduction in Area: 100.00% N/A N/A % Reduction in Volume: 100.00% N/A N/A Classification: Grade 1 N/A N/A Periwound Skin Texture: No Abnormalities Noted N/A N/A Periwound Skin Moisture: No Abnormalities Noted N/A N/A Periwound Skin Color: No Abnormalities Noted N/A N/A Tenderness on Palpation: No N/A N/A Treatment Notes Electronic Signature(s) Signed: 05/07/2017 8:07:20 AM By: [name] MD Entered By: [name] on 05/06/2017 09:19:44 [name] ([id number]) -------------------------------------------------------------------------------- [place] Details Patient Name: [name], [name]. Date of Service: 05/06/2017 8:45 AM Medical Record Number: [id number] Patient Account Number: [id number] Date of Birth/Sex: 1961-04-18 (56 y.o. Male) Treating RN: Ahmed Prima Primary Care Carolynne Schuchard: Lorelee Market Other Clinician: Referring Pria Klosinski: Lorelee Market Treating Carrin Vannostrand/Extender: Ricard Dillon Weeks in Treatment: 4 Active Inactive Electronic Signature(s) Signed: 05/06/2017 9:19:08 AM By: Alric Quan Entered By: Alric Quan on 05/06/2017  09:19:08 [name] ([id number]) -------------------------------------------------------------------------------- Pain Assessment Details Patient Name: [name]. Date of Service: 05/06/2017 8:45 AM Medical Record Number: [id number] Patient Account Number: [id number] Date of Birth/Sex: [birth year] (56 y.o. Male) Treating RN: Ahmed Prima Primary Care Gwynneth Fabio: Lorelee Market Other Clinician: Referring Laycee Fitzsimmons: Lorelee Market Treating Evolett Somarriba/Extender: Ricard Dillon Weeks in Treatment: 4 Active Problems Location of Pain Severity and Description of Pain Patient Has Paino No Site Locations Pain Management and Medication Current Pain Management: Electronic Signature(s) Signed: 05/09/2017 4:36:47 PM By: Alric Quan Entered By: Alric Quan on 05/06/2017 08:46:18 [name] ([id number]) -------------------------------------------------------------------------------- Patient/Caregiver Education Details Patient Name: [name], [name]. Date of Service: 05/06/2017 8:45 AM Medical Record Number: [id number] Patient Account Number: [id number] Date of Birth/Gender: March 23, 1961 (56 y.o. Male) Treating RN: Ahmed Prima Primary Care Physician: Lorelee Market Other Clinician: Referring Physician: Lorelee Market Treating Physician/Extender: Tito Dine in Treatment: 4 Education Assessment Education Provided To: Patient Education Topics Provided Wound/Skin Impairment: Handouts: Other: Please call or contact our office if you have any questions or concerns. Methods: Demonstration, Explain/Verbal Responses: State content correctly Electronic Signature(s) Signed: 05/09/2017 4:36:47 PM By: Alric Quan Entered By: Alric Quan on 05/06/2017 09:05:47 [name] ([id number]) -------------------------------------------------------------------------------- Wound Assessment Details Patient Name: [name]. Date of  Service: 05/06/2017 8:45 AM Medical Record Number: [id number] Patient Account Number: [id number] Date of Birth/Sex: [birth year] (56 y.o. Male) Treating RN: Ahmed Prima Primary Care Jory Tanguma: Lorelee Market Other Clinician: Referring Deyton Ellenbecker: Lorelee Market Treating Dvaughn Fickle/Extender: Ricard Dillon Weeks in Treatment: 4 Wound Status Wound Number: 9 Primary Diabetic Wound/Ulcer of the Lower Etiology: Extremity Wound Location: Left, Lateral Foot Wound Status: Healed - Epithelialized Wounding Event: Gradually Appeared Date Acquired: 03/18/2017 Weeks Of Treatment: 4 Clustered Wound: No Photos Photo Uploaded By: Alric Quan on 05/06/2017 09:18:12 Wound Measurements Length: (cm) 0 % Width: (cm) 0 % Depth: (cm) 0 Area: (cm) 0 Volume: (cm) 0 Reduction in Area: 100% Reduction in Volume: 100% Wound Description Classification: Grade 1 Periwound Skin Texture Texture Color No Abnormalities Noted: No No Abnormalities Noted: No Moisture No Abnormalities Noted: No Electronic Signature(s) Signed: 05/09/2017 4:36:47 PM By: Alric Quan Entered By: Alric Quan on 05/06/2017 09:03:13 [name] ([id number]) -------------------------------------------------------------------------------- [place] Details Patient Name: [name]. Date of Service: 05/06/2017 8:45 AM Medical Record Number: [id number] Patient Account Number: [id number] Date of Birth/Sex: 1960-11-04 (56 y.o. Male) Treating RN: Ahmed Prima Primary Care Kitti Mcclish: Lorelee Market Other Clinician: Referring Abdulkadir Emmanuel: Lorelee Market Treating Americus Perkey/Extender: Ricard Dillon Weeks in Treatment: 4 Vital Signs Time Taken: 08:46 Temperature (F): 98.1  Height (in): 70 Pulse (bpm): 94 Weight (lbs): 272 Respiratory Rate (breaths/min): 18 Body Mass Index (BMI): 39 Blood Pressure (mmHg): 152/89 Reference Range: 80 - 120 mg / dl Electronic Signature(s) Signed: 05/09/2017 4:36:47  PM By: Alric Quan Entered By: Alric Quan on 05/06/2017 08:47:57

## 2017-05-11 NOTE — Progress Notes (Signed)
RONDLE, LOHSE ([id number]) Visit Report for 05/06/2017 HPI Details Patient Name: [name], [name]. Date of Service: 05/06/2017 8:45 AM Medical Record Number: [id number] Patient Account Number: [id number] Date of Birth/Sex: 02/12/1961 (56 y.o. Male) Treating RN: Ahmed Prima Primary Care Provider: Lorelee Market Other Clinician: Referring Provider: Lorelee Market Treating Provider/Extender: Ricard Dillon Weeks in Treatment: 4 History of Present Illness HPI Description: Pleasant 56 year old with history of diabetes (Hgb A1c 10.8 in 2014) and peripheral neuropathy. No PVD. L ABI 1.1. Status post right great toe partial amputation years ago. He was at work and on 10/22/2014, was injured by a cart, and suffered an ulceration to his left anterior calf. He says that it subsequently became infected, and he was treated with a course of antibiotics. He was found on initial exam to have an ulceration on the dorsum of his left third toe. He was unaware of this and attributes it to pressure from his steel toed boots. More recently he injured his right anterior calf on a cart. Ambulating normally per his baseline. He has been undergoing regular debridements, applying mupirocin cream, and an Ace wrap for edema control. He returns to clinic for follow-up and is without complaints. No pain. No fever or chills. No drainage. 10/25/15; this is a 56 year old man who has type II diabetes with diabetic polyneuropathy. He tells me that he fractured his left fifth metatarsal in June 2016 when he presented with swelling. He does not recall a specific injury. His hemoglobin A1c was apparently too high at the time for consideration of surgery and he was put in some form of offloading. Ultimately he went to surgery in December with an allograft from his calcaneus to this site, plate and screws. He had an x-ray of the foot in March that showed concerns about nonunion. He tells me that in March he had to move  and basically moved himself. He was on his foot a lot and then noticed some drainage from an open area. He has been following with his orthopedic surgeon Dr. Doran Durand. He has been applying a felt donut, dry dressing and using his heel healing sandal. 11/01/15; this is a patient I saw last week for the first time. He had a small open wound on the plantar aspect of his left foot at roughly the level of the base of his fifth metatarsal. He had a considerable degree of thickened skin around this wound on the plantar aspect which I thought was from chronic pressure on this area. He tells [place] that he had drainage over the course of the week. No systemic symptoms. 11/08/15; culture last week grew Citrobacter korseri. This should've been sensitive to the Augmentin I gave him. He has seen Dr. Doran Durand who did his initial surgery and according the patient the plan is to give this another month and then the hardware might need to come out of this. This seems like a reasonable plan. I will adjust his antibiotics to ciprofloxacin which probably should continue for at least another 2 weeks. I gave him 10 days worth today 11/22/15 the patient has completed antibiotics. He has an appointment with Dr. Doran Durand this Friday. There is improved dimensions around the wound on the left fifth metatarsal base 11/29/15; the patient has completed antibiotics last week. Apparently his appointment with Dr. Doran Durand it is not until this Friday. Dimensions are roughly the same. 12/06/15; saw Dr. Doran Durand. No x-ray told the end of the month, next appointment June 30. We have been using Aquacel Ag 12/13/15:  No major change this week. Using Aquacel AG 621/17; arrived this week with maceration around the wound. There was quite a bit of undermining which required surgical debridement. I changed him to [facility] last week, by the patient's admission he was up on this more this week 12/27/15; macerated tissue around the wound is removed with a  scalpel and pickups. There is no undermining. Nonviable subcutaneous tissue and skin taken from the superior circumference of the wound is slough from the surface. READMISSION 03/06/16 since I last saw this patient at the end of June, he went for surgery on 01/11/16 by Dr. Doran Durand of orthopedics. He had a left foot irrigation and debridement, removal of hardware and placement of wound VAC. He is also been followed by Dr. Megan Salon of infectious disease and completed a six-week course of IV Rocephin for group B strep and Enterobacter in the bone at the time of surgery. Apparently at the time of surgery the bone looked healthy so I don't think any bone was actually removed. He has been using silver alginate based dressings on the same wound area at the base of the left fifth metatarsal on the left. I note that he is also had arterial studies on 01/08/16, these showed a left ABI of 1.2 to and a right ABI of 1.3. [name], [name] ([id number]) Waveforms were listed as biphasic. He was not felt to have any specific arterial issues. 03/13/16; no real change in the condition of the wound at the left lateral foot at roughly the base of his fifth metatarsal. Use silver alginate last week. 03/18/16 arrives today with no open area. Being suspicious of the overlying callus I. Some of this back although I see nothing but covering tissue here/epithelium. There is no surrounding tenderness READMISSION 04/16/16 this is a patient I discharged about a month ago. Initially a surgical wound on the lateral aspect of his left foot which subsequently became infected. The story he is giving today that he went back into his own modified shoe started to notice pain 2 weeks ago he was seen in Dr. Nona Dell office by a physician assistant last Wednesday and according the patient was told that everything looked fine however this is clearly now broken open and he has an open wound in the same spot that we have been dealing with  repetitively. Situation is complicated by the fact that he is running short of money on long-term disability. He has not taken his insulin and at least 2 weeks was previously on NovoLog short acting insulin on a sliding scale and TRESIBA 35 units at bedtime. He is no longer able to afford any of his medication he was in the x-ray on 10/15. A plain x-ray showed healed fracture of the left fifth metatarsal bone status post removal of the associated plate and screw fixation hardware. There was no acute appearing osseous abnormality. His blood work showed a white count of 9.2 with an essentially normal differential comprehensive metabolic panel was normal. Previous CT scan of the foot on 01/08/16 showed no osteomyelitis previous vascular workup showed no evidence of significant PAD on 01/13/16 04/23/16; culture I did last week grew enterococcus [ampicillin sensitive] and MRSA. He saw infectious disease yesterday. They stop the clindamycin and ordered an MRI. This is not unreasonable. All the hardware is out of the foot at this point. 04/30/16 at this point in time we are still awaiting the results of the MRI at this point in time. Patient did have an area which  appears to be somewhat macerated in the proximal portion of the wound where there is overlying necrotic skin that is doing nothing more than trapping fluid underneath. He continues to state that he does have some discomfort and again the concern is for the possibility of osteomyelitis hence the reason for the MRI order. We have been using a silver alginate dressing but again I think the reason this with his macerated as it was is that the dressing obscene could not reach the entirety of the wound due to some necrotic skin covering the proximal portion. No bleeding noted at this point in time on initial evaluation. 05/07/16 we receive the results of patient's MRI which shows that he has no evidence of osteomyelitis. This obviously is excellent news.  Patient was definitely happy to hear this. He tells me the wound appears to be doing very well at this point in time and is pleased with the progress currently. 05/15/16; as noted the patient did not have osteomyelitis. He has been released by infectious disease and orthopedics. His wound is still open he had a debridement last week but complain when he got home he "bleeding for half a day". He is not had any pain. We have been using silver alginate with Kerlix gauze wrap. 05/28/16; patient arrives today with the wound in much the same condition as last time. He has a small opening on the lateral aspect of his foot however with debridement there is clear undermining medially there is no real evidence of infection here and I didn't see any point in culturing this. One would have to wonder if this isn't a simple matter of in adequate offloading as he is been using a healing sandal. 06/05/16; the open area is now on the plantar aspect of his foot and not decide. The wound almost appears to have "migrated". This was the term use by our intake nurse. 06/12/16; open area on the plantar aspect of his foot. Base of the wound looks very healthy. This will be his second week in a total contact cast 06/19/16; patient arrived today in a total contact cast. There was some expectation from our staff and myself that this area would be healed. Unfortunately the area was boggy and with rec pressure a fairly substantial amount of purulent drainage was obtained. Specimen obtained for culture. The patient had no complaints of systemic problems including fever or chills or instability of his diabetes. There was no pain in the foot. Nevertheless a extensive debridement was required. 06/26/16; patient's culture from the abscess last week grew a combination of MRSA and ampicillin sensitive enterococcus. I had him on Augmentin and Septra however I have elected to give him a full 10 day course of Zyvox instead as I  Recently treated this combination of organisms with Augmentin and Septra before. Arrives today with no systemic symptoms 07/03/16; the patient has 2 more treatments of Zyvox and then he is finished antibiotics the wound has improved now mostly on the lateral aspect of his foot. There is still some tenderness when he walks. 07/10/16; patient arrives today with Zyvox completed. He only has a small open area remaining. 07/24/16; she arrives here with no overt open area. The covering is thick callus/eschar. Nevertheless there is no open area here. He has some tenderness underneath the area but no overt infection is observed no drainage. The patient has a deformity in the foot with this area weekly to be exposed to more pressure in his foot where nevertheless that something  we are going to have to deal with going forward. The patient has diabetic workboots and diabetic shoes. He has had a long difficult course with the area here. This started as a fracture at work. He had bone grafting from his calcaneus and screws. This got infected he had to have more surgery on the area. Bone at the time of that surgery I think showed enterococcus and group B strep. He had 6 weeks of Rocephin. Since then the area has waxed and waned in its difficulty. Recently he had an DAQUAN, CRAPPS. ([id number]) MRI in December that did not show osteomyelitis nevertheless he had an abscess that grew MRSA and enterococcus which I elected to treat with Zyvox. This was in December and this wound is actually "healed" over 08/21/16; the patient came in today for his one-month follow-up visit. The area on the lateral aspect of his left foot looks much the same as some month ago. There is no evidence of an open wound here. However the patient tells me about a week after he went back to work he developed severe pain and swelling in the plantar aspect of his right foot first and fifth metatarsal heads. He has had wounds in the right foot before  in fact seems to have had a interphalangeal joint amputation of the right toe. He went to see his podiatrist at [facility]. She told him that he could not work on his feet. She told him to go back and his cam walking boot on the left. Not have open wounds obviously on the right. The patient is actually gone ahead and retired from his job because he does not feel he can work on his feet 09/18/16; patient comes back in saying he recently had some pain on the lateral aspect of his left foot. Asked that we look at this. Other than that all of his wounds have a viable surface. He has diabetic shoes. He is retired from work. READMISSION Jamis Kryder is a 56 year old man who we cared for for a prolonged period of time in late 2017 into March 2018. At that point he had suffered a fracture of his left fifth metatarsal at about the level of the base of the fifth metatarsal. He had surgical repair by Dr. Meryl Dare and he developed a very refractory wound over the fifth metatarsal. Ultimately this became infected he required hardware removal this eventually closed over and we discharged from the clinic in March of this year. He tells me as well until roughly 3 weeks ago when he developed increasing pain in the same area making it difficult for him to sleep. He was admitted to hospital from 9/5 through 9/7 with now an ulcer in the same area on the lateral aspect of his left foot at the level of the base of the metatarsal. X-rays and MRIs during this hospitalization were negative. Wound culture showed a combination of Escherichia coli, Morganella and Pseudomonas. He was given IV Cipro and vancomycin the hospital and then discharged home on Cipro and Doxy. He returned to the ER on 9/16 with leg swelling and discomfort. I don't think anything was added at that point although he did have an ultrasound of the left leg that was negative for DVT. He was back in the ER on 10/4 again with pain in the area he was  given Bactrim but states he had a significant allergic reaction to that which has abated once he stopped the Bactrim. I don't think he had a  full course. He went to see his podiatrist yesterday who recommended some form of foot soak. He has a Darco forefoot offloading boot The patient is a type II diabetic on insulin. Tells me his recent hemoglobin A1c was 7. Arterial studies done in July 2017 showed an ABI in the right of 1.3 on the left 1.2 to biphasic waveforms bilaterally. He was not felt to have arterial disease. As noted the patient has had 2 MRIs most recently on 10/4 this showed no evidence of an abscess or osteomyelitis and no change since the prior study of 03/05/17 there was nonspecific edema on the dorsum of the foot. ABIs in this clinic today 1.2 which would be unchanged from his study done in July/17 04/15/17; now down to 1 small wound on the left lateral foot. We wrapped him and applied collagen last week and the foot is fairly macerated. 04/22/17; small wound on the left lateral foot however it has some undermining. Using collagen 04/29/17; small wound on the left lateral foot some surrounding callus. Chronic damage in this area as a result of initial fracture nonhealing and secondary infection. 05/06/17; the patient arrives today with the area on the left lateral foot closed. There is thick subcutaneous tissue with a layer of callus. I took some of the callus off just to ensure adequate closure of the underlying tissue and there is no open wound here. He has considerable deformity of this area of his foot however and unless there is an ability to offload this I think opening is going to be recurrent. He tells me he has diabetic foot wear although I have not actually seen this Electronic Signature(s) Signed: 05/07/2017 8:07:20 AM By: [name] MD Entered By: [name] on 05/06/2017 09:21:34 [name]  ([id number]) -------------------------------------------------------------------------------- Physical Exam Details Patient Name: [name], [name]. Date of Service: 05/06/2017 8:45 AM Medical Record Number: [id number] Patient Account Number: [id number] Date of Birth/Sex: January 07, 1961 (56 y.o. Male) Treating RN: Ahmed Prima Primary Care Provider: Lorelee Market Other Clinician: Referring Provider: Lorelee Market Treating Provider/Extender: Ricard Dillon Weeks in Treatment: 4 Constitutional Patient is hypertensive.. Pulse regular and within target range for patient.Marland Kitchen Respirations regular, non-labored and within target range.. Temperature is normal and within the target range for the patient.Marland Kitchen appears in no distress. Notes Wound exam; there is no obvious open area here. Gentle removal of some of the surface callus does not reveal an open area or anything that looks particularly threatened. There is no evidence of surrounding infection however there is a significant deformity of the lateral left foot. Electronic Signature(s) Signed: 05/07/2017 8:07:20 AM By: [name] MD Entered By: [name] on 05/06/2017 09:22:41 [name] ([id number]) -------------------------------------------------------------------------------- Physician Orders Details Patient Name: [name], [name]. Date of Service: 05/06/2017 8:45 AM Medical Record Number: [id number] Patient Account Number: [id number] Date of Birth/Sex: 1961/01/20 (56 y.o. Male) Treating RN: Ahmed Prima Primary Care Provider: Lorelee Market Other Clinician: Referring Provider: Lorelee Market Treating Provider/Extender: Tito Dine in Treatment: 4 Verbal / Phone Orders: Yes Clinician: Carolyne Fiscal, Debi Read Back and Verified: Yes Diagnosis Coding Discharge From [facility] o Discharge from [place] area clean and dry. Moisturize area twice daily. Keep area protected or padded.  Please call or contact our office if you have any questions or concerns. Electronic Signature(s) Signed: 05/07/2017 8:07:20 AM By: [name] MD Signed: 05/09/2017 4:36:47 PM By: Alric Quan Entered By: Alric Quan on 05/06/2017 09:05:06 [name] ([id number]) -------------------------------------------------------------------------------- Problem  List Details Patient Name: IBRAHIM, MCPHEETERS. Date of Service: 05/06/2017 8:45 AM Medical Record Number: [id number] Patient Account Number: [id number] Date of Birth/Sex: 06/28/1961 (55 y.o. Male) Treating RN: Ahmed Prima Primary Care Provider: Lorelee Market Other Clinician: Referring Provider: Lorelee Market Treating Provider/Extender: Tito Dine in Treatment: 4 Active Problems ICD-10 Encounter Code Description Active Date Diagnosis E11.621 Type 2 diabetes mellitus with foot ulcer 04/08/2017 Yes L97.521 Non-pressure chronic ulcer of other part of left foot limited to 04/08/2017 Yes breakdown of skin Inactive Problems Resolved Problems Electronic Signature(s) Signed: 05/06/2017 10:05:12 AM By: Alric Quan Signed: 05/07/2017 8:07:20 AM By: [name] MD Entered By: Alric Quan on 05/06/2017 10:05:12 [name] ([id number]) -------------------------------------------------------------------------------- Progress Note Details Patient Name: [name], [name]. Date of Service: 05/06/2017 8:45 AM Medical Record Number: [id number] Patient Account Number: [id number] Date of Birth/Sex: 28-Mar-1961 (56 y.o. Male) Treating RN: Ahmed Prima Primary Care Provider: Lorelee Market Other Clinician: Referring Provider: Lorelee Market Treating Provider/Extender: Ricard Dillon Weeks in Treatment: 4 Subjective History of Present Illness (HPI) Pleasant 56 year old with history of diabetes (Hgb A1c 10.8 in 2014) and peripheral neuropathy. No PVD. L ABI 1.1. Status post right great toe  partial amputation years ago. He was at work and on 10/22/2014, was injured by a cart, and suffered an ulceration to his left anterior calf. He says that it subsequently became infected, and he was treated with a course of antibiotics. He was found on initial exam to have an ulceration on the dorsum of his left third toe. He was unaware of this and attributes it to pressure from his steel toed boots. More recently he injured his right anterior calf on a cart. Ambulating normally per his baseline. He has been undergoing regular debridements, applying mupirocin cream, and an Ace wrap for edema control. He returns to clinic for follow-up and is without complaints. No pain. No fever or chills. No drainage. 10/25/15; this is a 56 year old man who has type II diabetes with diabetic polyneuropathy. He tells me that he fractured his left fifth metatarsal in June 2016 when he presented with swelling. He does not recall a specific injury. His hemoglobin A1c was apparently too high at the time for consideration of surgery and he was put in some form of offloading. Ultimately he went to surgery in December with an allograft from his calcaneus to this site, plate and screws. He had an x-ray of the foot in March that showed concerns about nonunion. He tells me that in March he had to move and basically moved himself. He was on his foot a lot and then noticed some drainage from an open area. He has been following with his orthopedic surgeon Dr. Doran Durand. He has been applying a felt donut, dry dressing and using his heel healing sandal. 11/01/15; this is a patient I saw last week for the first time. He had a small open wound on the plantar aspect of his left foot at roughly the level of the base of his fifth metatarsal. He had a considerable degree of thickened skin around this wound on the plantar aspect which I thought was from chronic pressure on this area. He tells [place] that he had drainage over the course of  the week. No systemic symptoms. 11/08/15; culture last week grew Citrobacter korseri. This should've been sensitive to the Augmentin I gave him. He has seen Dr. Doran Durand who did his initial surgery and according the patient the plan is to give this another month and then  the hardware might need to come out of this. This seems like a reasonable plan. I will adjust his antibiotics to ciprofloxacin which probably should continue for at least another 2 weeks. I gave him 10 days worth today 11/22/15 the patient has completed antibiotics. He has an appointment with Dr. Doran Durand this Friday. There is improved dimensions around the wound on the left fifth metatarsal base 11/29/15; the patient has completed antibiotics last week. Apparently his appointment with Dr. Doran Durand it is not until this Friday. Dimensions are roughly the same. 12/06/15; saw Dr. Doran Durand. No x-ray told the end of the month, next appointment June 30. We have been using Aquacel Ag 12/13/15: No major change this week. Using Aquacel AG 621/17; arrived this week with maceration around the wound. There was quite a bit of undermining which required surgical debridement. I changed him to [facility] last week, by the patient's admission he was up on this more this week 12/27/15; macerated tissue around the wound is removed with a scalpel and pickups. There is no undermining. Nonviable subcutaneous tissue and skin taken from the superior circumference of the wound is slough from the surface. READMISSION 03/06/16 since I last saw this patient at the end of June, he went for surgery on 01/11/16 by Dr. Doran Durand of orthopedics. He had a left foot irrigation and debridement, removal of hardware and placement of wound VAC. He is also been followed by Dr. Megan Salon of infectious disease and completed a six-week course of IV Rocephin for group B strep and Enterobacter in the bone at the time of surgery. Apparently at the time of surgery the bone looked healthy so I  don't think any bone was actually removed. He has been using silver alginate based dressings on the same wound area at the base of the left fifth metatarsal on the left. I note that he is also had arterial studies on 01/08/16, these showed a left ABI of 1.2 to and a right ABI of 1.3. Waveforms were listed as biphasic. He was not felt to have any specific arterial issues. 03/13/16; no real change in the condition of the wound at the left lateral foot at roughly the base of his fifth metatarsal. Use [name], [name]. ([id number]) silver alginate last week. 03/18/16 arrives today with no open area. Being suspicious of the overlying callus I. Some of this back although I see nothing but covering tissue here/epithelium. There is no surrounding tenderness READMISSION 04/16/16 this is a patient I discharged about a month ago. Initially a surgical wound on the lateral aspect of his left foot which subsequently became infected. The story he is giving today that he went back into his own modified shoe started to notice pain 2 weeks ago he was seen in Dr. Nona Dell office by a physician assistant last Wednesday and according the patient was told that everything looked fine however this is clearly now broken open and he has an open wound in the same spot that we have been dealing with repetitively. Situation is complicated by the fact that he is running short of money on long-term disability. He has not taken his insulin and at least 2 weeks was previously on NovoLog short acting insulin on a sliding scale and TRESIBA 35 units at bedtime. He is no longer able to afford any of his medication he was in the x-ray on 10/15. A plain x-ray showed healed fracture of the left fifth metatarsal bone status post removal of the associated plate and screw fixation hardware.  There was no acute appearing osseous abnormality. His blood work showed a white count of 9.2 with an essentially normal differential comprehensive metabolic  panel was normal. Previous CT scan of the foot on 01/08/16 showed no osteomyelitis previous vascular workup showed no evidence of significant PAD on 01/13/16 04/23/16; culture I did last week grew enterococcus [ampicillin sensitive] and MRSA. He saw infectious disease yesterday. They stop the clindamycin and ordered an MRI. This is not unreasonable. All the hardware is out of the foot at this point. 04/30/16 at this point in time we are still awaiting the results of the MRI at this point in time. Patient did have an area which appears to be somewhat macerated in the proximal portion of the wound where there is overlying necrotic skin that is doing nothing more than trapping fluid underneath. He continues to state that he does have some discomfort and again the concern is for the possibility of osteomyelitis hence the reason for the MRI order. We have been using a silver alginate dressing but again I think the reason this with his macerated as it was is that the dressing obscene could not reach the entirety of the wound due to some necrotic skin covering the proximal portion. No bleeding noted at this point in time on initial evaluation. 05/07/16 we receive the results of patient's MRI which shows that he has no evidence of osteomyelitis. This obviously is excellent news. Patient was definitely happy to hear this. He tells me the wound appears to be doing very well at this point in time and is pleased with the progress currently. 05/15/16; as noted the patient did not have osteomyelitis. He has been released by infectious disease and orthopedics. His wound is still open he had a debridement last week but complain when he got home he "bleeding for half a day". He is not had any pain. We have been using silver alginate with Kerlix gauze wrap. 05/28/16; patient arrives today with the wound in much the same condition as last time. He has a small opening on the lateral aspect of his foot however with  debridement there is clear undermining medially there is no real evidence of infection here and I didn't see any point in culturing this. One would have to wonder if this isn't a simple matter of in adequate offloading as he is been using a healing sandal. 06/05/16; the open area is now on the plantar aspect of his foot and not decide. The wound almost appears to have "migrated". This was the term use by our intake nurse. 06/12/16; open area on the plantar aspect of his foot. Base of the wound looks very healthy. This will be his second week in a total contact cast 06/19/16; patient arrived today in a total contact cast. There was some expectation from our staff and myself that this area would be healed. Unfortunately the area was boggy and with rec pressure a fairly substantial amount of purulent drainage was obtained. Specimen obtained for culture. The patient had no complaints of systemic problems including fever or chills or instability of his diabetes. There was no pain in the foot. Nevertheless a extensive debridement was required. 06/26/16; patient's culture from the abscess last week grew a combination of MRSA and ampicillin sensitive enterococcus. I had him on Augmentin and Septra however I have elected to give him a full 10 day course of Zyvox instead as I Recently treated this combination of organisms with Augmentin and Septra before. Arrives today with no  systemic symptoms 07/03/16; the patient has 2 more treatments of Zyvox and then he is finished antibiotics the wound has improved now mostly on the lateral aspect of his foot. There is still some tenderness when he walks. 07/10/16; patient arrives today with Zyvox completed. He only has a small open area remaining. 07/24/16; she arrives here with no overt open area. The covering is thick callus/eschar. Nevertheless there is no open area here. He has some tenderness underneath the area but no overt infection is observed no drainage. The  patient has a deformity in the foot with this area weekly to be exposed to more pressure in his foot where nevertheless that something we are going to have to deal with going forward. The patient has diabetic workboots and diabetic shoes. He has had a long difficult course with the area here. This started as a fracture at work. He had bone grafting from his calcaneus and screws. This got infected he had to have more surgery on the area. Bone at the time of that surgery I think showed enterococcus and group B strep. He had 6 weeks of Rocephin. Since then the area has waxed and waned in its difficulty. Recently he had an MRI in December that did not show osteomyelitis nevertheless he had an abscess that grew MRSA and enterococcus which I elected to treat with Zyvox. This was in December and this wound is actually "healed" over [name], [name] ([id number]) 08/21/16; the patient came in today for his one-month follow-up visit. The area on the lateral aspect of his left foot looks much the same as some month ago. There is no evidence of an open wound here. However the patient tells me about a week after he went back to work he developed severe pain and swelling in the plantar aspect of his right foot first and fifth metatarsal heads. 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 He went to see his podiatrist at [facility]. She told him that he could not work on his feet. She told him to go back and his cam walking boot on the left. Not have open wounds obviously on the right. The patient is actually gone ahead and retired from his job because he does not feel he can work on his feet 09/18/16; patient comes back in saying he recently had some pain on the lateral aspect of his left foot. Asked that we look at this. Other than that all of his wounds have a viable surface. He has diabetic shoes. He is retired from  work. READMISSION [name] is a 56 year old man who we cared for for a prolonged period of time in late 2017 into March 2018. At that point he had suffered a fracture of his left fifth metatarsal at about the level of the base of the fifth metatarsal. He had surgical repair by Dr. Meryl Dare and he developed a very refractory wound over the fifth metatarsal. Ultimately this became infected he required hardware removal this eventually closed over and we discharged from the clinic in March of this year. He tells me as well until roughly 3 weeks ago when he developed increasing pain in the same area making it difficult for him to sleep. He was admitted to hospital from 9/5 through 9/7 with now an ulcer in the same area on the lateral aspect of his left foot at the level of the base of the metatarsal.  X-rays and MRIs during this hospitalization were negative. Wound culture showed a combination of Escherichia coli, Morganella and Pseudomonas. He was given IV Cipro and vancomycin the hospital and then discharged home on Cipro and Doxy. He returned to the ER on 9/16 with leg swelling and discomfort. I don't think anything was added at that point although he did have an ultrasound of the left leg that was negative for DVT. He was back in the ER on 10/4 again with pain in the area he was given Bactrim but states he had a significant allergic reaction to that which has abated once he stopped the Bactrim. I don't think he had a full course. He went to see his podiatrist yesterday who recommended some form of foot soak. He has a Darco forefoot offloading boot The patient is a type II diabetic on insulin. Tells me his recent hemoglobin A1c was 7. Arterial studies done in July 2017 showed an ABI in the right of 1.3 on the left 1.2 to biphasic waveforms bilaterally. He was not felt to have arterial disease. As noted the patient has had 2 MRIs most recently on 10/4 this showed no evidence of an abscess or  osteomyelitis and no change since the prior study of 03/05/17 there was nonspecific edema on the dorsum of the foot. ABIs in this clinic today 1.2 which would be unchanged from his study done in July/17 04/15/17; now down to 1 small wound on the left lateral foot. We wrapped him and applied collagen last week and the foot is fairly macerated. 04/22/17; small wound on the left lateral foot however it has some undermining. Using collagen 04/29/17; small wound on the left lateral foot some surrounding callus. Chronic damage in this area as a result of initial fracture nonhealing and secondary infection. 05/06/17; the patient arrives today with the area on the left lateral foot closed. There is thick subcutaneous tissue with a layer of callus. I took some of the callus off just to ensure adequate closure of the underlying tissue and there is no open wound here. He has considerable deformity of this area of his foot however and unless there is an ability to offload this I think opening is going to be recurrent. He tells me he has diabetic foot wear although I have not actually seen this Objective Constitutional Patient is hypertensive.. Pulse regular and within target range for patient.Marland Kitchen Respirations regular, non-labored and within target range.. Temperature is normal and within the target range for the patient.Marland Kitchen appears in no distress. Vitals Time Taken: 8:46 AM, Height: 70 in, Weight: 272 lbs, BMI: 39, Temperature: 98.1 F, Pulse: 94 bpm, Respiratory Rate: 18 breaths/min, Blood Pressure: 152/89 mmHg. [name], [name] ([id number]) General Notes: Wound exam; there is no obvious open area here. Gentle removal of some of the surface callus does not reveal an open area or anything that looks particularly threatened. There is no evidence of surrounding infection however there is a significant deformity of the lateral left foot. Integumentary (Hair, Skin) Wound #9 status is Healed - Epithelialized.  Original cause of wound was Gradually Appeared. The wound is located on the Left,Lateral Foot. The wound measures 0cm length x 0cm width x 0cm depth; 0cm^2 area and 0cm^3 volume. Assessment Active Problems ICD-10 E11.621 - Type 2 diabetes mellitus with foot ulcer L97.521 - Non-pressure chronic ulcer of other part of left foot limited to breakdown of skin Plan Discharge From [facility] Services: Discharge from [place] area clean and dry.  Moisturize area twice daily. Keep area protected or padded. Please call or contact our office if you have any questions or concerns. #1 I don't have any good suggestion with regards to secondary prevention other than to pad this area of his foot perhaps with gauze and a thick border foam and change this every day. Keep the underlying callus moist. #2 he tells me he has custom-made footwear at least diabetic foot wear although I've never really seen this #3 I'm somewhat concerned that this is going to be a recurrent theme nevertheless I couldn't think of anything else easily at this time that would help the situation Electronic Signature(s) Signed: 05/07/2017 8:07:20 AM By: [name] MD Entered By: [name] on 05/06/2017 09:24:14 [name] ([id number]) -------------------------------------------------------------------------------- SuperBill Details Patient Name: [name]. Date of Service: 05/06/2017 Medical Record Number: [id number] Patient Account Number: [id number] Date of Birth/Sex: 1960/09/09 (56 y.o. Male) Treating RN: Ahmed Prima Primary Care Provider: Lorelee Market Other Clinician: Referring Provider: Lorelee Market Treating Provider/Extender: Ricard Dillon Weeks in Treatment: 4 Diagnosis Coding ICD-10 Codes Code Description E11.621 Type 2 diabetes mellitus with foot ulcer L97.521 Non-pressure chronic ulcer of other part of left foot limited to breakdown of skin Facility Procedures CPT4  Code: 76283151 Description: [phone] - WOUND CARE VISIT-LEV 2 EST PT Modifier: Quantity: 1 Physician Procedures CPT4 Code Description: [id number] - WC PHYS LEVEL 2 - EST PT ICD-10 Diagnosis Description E11.621 Type 2 diabetes mellitus with foot ulcer L97.521 Non-pressure chronic ulcer of other part of left foot limited t Modifier: o breakdown of sk Quantity: 1 in Electronic Signature(s) Signed: 05/06/2017 10:05:07 AM By: Alric Quan Signed: 05/07/2017 8:07:20 AM By: [name] MD Entered By: Alric Quan on 05/06/2017 10:05:07

## 2017-05-21 ENCOUNTER — Other Ambulatory Visit: Payer: Self-pay

## 2017-05-21 MED ORDER — BASAGLAR KWIKPEN 100 UNIT/ML ~~LOC~~ SOPN: 35.0000 [IU] | PEN_INJECTOR | Freq: Every day | SUBCUTANEOUS | 3 refills | Status: DC

## 2017-05-27 ENCOUNTER — Other Ambulatory Visit: Payer: Self-pay

## 2017-08-30 ENCOUNTER — Emergency Department
Admission: EM | Admit: 2017-08-30 | Discharge: 2017-08-30 | Disposition: A | Discharge status: Home / Self Care | Payer: Commercial Managed Care - PPO | Attending: Emergency Medicine | Admitting: Emergency Medicine

## 2017-08-30 ENCOUNTER — Emergency Department: Payer: Commercial Managed Care - PPO

## 2017-08-30 ENCOUNTER — Encounter: Payer: Self-pay | Admitting: Emergency Medicine

## 2017-08-30 DIAGNOSIS — E1140 Type 2 diabetes mellitus with diabetic neuropathy, unspecified: Secondary | ICD-10-CM | POA: Insufficient documentation

## 2017-08-30 DIAGNOSIS — Z794 Long term (current) use of insulin: Secondary | ICD-10-CM | POA: Insufficient documentation

## 2017-08-30 DIAGNOSIS — L03116 Cellulitis of left lower limb: Secondary | ICD-10-CM

## 2017-08-30 DIAGNOSIS — I10 Essential (primary) hypertension: Secondary | ICD-10-CM | POA: Diagnosis not present

## 2017-08-30 DIAGNOSIS — Z89411 Acquired absence of right great toe: Secondary | ICD-10-CM | POA: Diagnosis not present

## 2017-08-30 DIAGNOSIS — Z79899 Other long term (current) drug therapy: Secondary | ICD-10-CM | POA: Insufficient documentation

## 2017-08-30 DIAGNOSIS — M79672 Pain in left foot: Secondary | ICD-10-CM | POA: Diagnosis present

## 2017-08-30 LAB — CBC WITH DIFFERENTIAL/PLATELET
Basophils Absolute: 0 10*3/uL (ref 0–0.1)
Basophils Relative: 1 %
Eosinophils Absolute: 0.4 10*3/uL (ref 0–0.7)
Eosinophils Relative: 5 %
HEMATOCRIT: 45.8 % (ref 40.0–52.0)
HEMOGLOBIN: 15.3 g/dL (ref 13.0–18.0)
LYMPHS ABS: 1.8 10*3/uL (ref 1.0–3.6)
LYMPHS PCT: 25 %
MCH: 31.1 pg (ref 26.0–34.0)
MCHC: 33.5 g/dL (ref 32.0–36.0)
MCV: 93 fL (ref 80.0–100.0)
MONOS PCT: 12 %
Monocytes Absolute: 0.8 10*3/uL (ref 0.2–1.0)
NEUTROS ABS: 4.2 10*3/uL (ref 1.4–6.5)
NEUTROS PCT: 57 %
Platelets: 197 10*3/uL (ref 150–440)
RBC: 4.92 MIL/uL (ref 4.40–5.90)
RDW: 13.7 % (ref 11.5–14.5)
WBC: 7.3 10*3/uL (ref 3.8–10.6)

## 2017-08-30 LAB — COMPREHENSIVE METABOLIC PANEL
ALK PHOS: 72 U/L (ref 38–126)
ALT: 19 U/L (ref 17–63)
ANION GAP: 9 (ref 5–15)
AST: 20 U/L (ref 15–41)
Albumin: 3.8 g/dL (ref 3.5–5.0)
BILIRUBIN TOTAL: 0.6 mg/dL (ref 0.3–1.2)
BUN: 15 mg/dL (ref 6–20)
CALCIUM: 9 mg/dL (ref 8.9–10.3)
CO2: 25 mmol/L (ref 22–32)
CREATININE: 0.85 mg/dL (ref 0.61–1.24)
Chloride: 104 mmol/L (ref 101–111)
GFR calc Af Amer: >60 mL/min (ref >60)
GFR calc non Af Amer: >60 mL/min (ref >60)
GLUCOSE: 152 mg/dL — AB (ref 65–99)
Potassium: 4.2 mmol/L (ref 3.5–5.1)
Sodium: 138 mmol/L (ref 135–145)
TOTAL PROTEIN: 7.8 g/dL (ref 6.5–8.1)

## 2017-08-30 MED ORDER — TRAMADOL HCL 50 MG PO TABS: 50.0000 mg | ORAL_TABLET | Freq: Four times a day (QID) | ORAL | 0 refills | Status: DC | PRN

## 2017-08-30 MED ORDER — DOXYCYCLINE MONOHYDRATE 100 MG PO CAPS: 100.0000 mg | ORAL_CAPSULE | Freq: Two times a day (BID) | ORAL | 0 refills | Status: DC

## 2017-08-30 MED ORDER — CIPROFLOXACIN HCL 500 MG PO TABS: 500.0000 mg | ORAL_TABLET | Freq: Two times a day (BID) | ORAL | 0 refills | Status: AC

## 2017-08-30 NOTE — ED Provider Notes (Signed)
[facility] Emergency Department Provider Note   ____________________________________________   First MD Initiated Contact with Patient 08/30/17 1228     (approximate)  I have reviewed the triage vital signs and the nursing notes.   HISTORY  Chief Complaint Foot Pain    HPI [name] is a 57 y.o. male patient complaint of left foot pain for 2 weeks.  Patient has intermittent history over 3 years of infection to the same foot.  Patient has history of diabetic with neuropathy.  Patient state he does not routinely monitor his blood sugar levels.  Patient denies fever chills with this complaint.  Patient denies drainage from the lesion on the lateral aspect of the left foot.  Patient history is vague on history of allergic reaction to medication.  Patient state he was given Bactrim by this department last year and developed a rash.  There is no documentation of allergic reaction even though the patient state he followed up with his department.  Patient is followed by wound care and again has no record of allergic reaction to any medications.  His medications for antibiotics exchange based on the cultures taken from his wound.  Patient rates his pain as a 10/10.  Patient described the pain as "stabbing".  Past Medical History:  Diagnosis Date  . Arthritis   . BPH (benign prostatic hypertrophy)   . Diabetes mellitus   . Diabetic neuropathy ([place])   . Hypertension   . Metatarsal bone fracture    left 5th toe  . Osteomyelitis of toe of right foot ([place])   . Post-operative infection    and diabetic ulcer left foot  . Sleep apnea     does not wear CPAP  . Wears glasses     Patient Active Problem List   Diagnosis Date Noted  . Cellulitis of left foot 03/05/2017  . Surgery, elective   . Hardware complicating wound infection ([place])   . Diabetes mellitus due to underlying condition, uncontrolled, with diabetic neuropathy ([place])   . Metatarsal stress fracture  of left foot 01/12/2016  . Diabetes mellitus due to underlying condition with diabetic polyneuropathy ([place]) 01/12/2016  . Diabetic infection of left foot ([place]) 01/12/2016  . Diabetic ulcer of foot associated with type 2 diabetes mellitus, with necrosis of muscle ([place]) 01/11/2016    Past Surgical History:  Procedure Laterality Date  . COLONOSCOPY W/ BIOPSIES AND POLYPECTOMY    . I&D EXTREMITY Left 01/11/2016   Procedure: LEFT FOOT IRRIGATION AND DEBRIDEMENT WOUND VAC AND REMOVAL OF HARDWARE;  Surgeon: Wylene Simmer, MD;  Location: [place];  Service: Orthopedics;  Laterality: Left;  . ORIF TOE FRACTURE Left 06/08/2015   Procedure: OPEN REDUCTION INTERNAL FIXATION (ORIF) LEFT FIFTH METATARSAL BASE FRACTURE NONUNION; CALCANEAL AUTOGRAFT ;  Surgeon: Wylene Simmer, MD;  Location: [place];  Service: Orthopedics;  Laterality: Left;  . PATELLA RECONSTRUCTION Left 2005  . PROSTATE ABLATION  2014  . TOE AMPUTATION     partial amputation right great toe    Prior to Admission medications   Medication Sig Start Date End Date Taking? Authorizing Provider  aspirin EC 81 MG tablet Take 81 mg by mouth daily.    [provider]  ciprofloxacin (CIPRO) 500 MG tablet Take 1 tablet (500 mg total) by mouth 2 (two) times daily for 10 days. 08/30/17 09/09/17  Sable Feil, PA-C  docusate sodium (COLACE) 100 MG capsule Take 1 capsule (100 mg total) by mouth 2 (two) times  daily. While taking narcotic pain medicine. 01/12/16   Corky Sing, PA-C  doxycycline (MONODOX) 100 MG capsule Take 1 capsule (100 mg total) by mouth 2 (two) times daily. 08/30/17   Sable Feil, PA-C  FARXIGA 10 MG TABS tablet Take 1 tablet by mouth daily. 03/03/17   [provider]  gabapentin (NEURONTIN) 400 MG capsule Take 400 mg by mouth 2 (two) times daily.    [provider]  insulin aspart (NOVOLOG) 100 UNIT/ML injection Inject 12-20 Units into the skin 3 (three) times daily before meals.     [provider]  Insulin Glargine (BASAGLAR KWIKPEN) 100 UNIT/ML SOPN Inject 0.35 mLs (35 Units total) into the skin at bedtime. 05/21/17   Renato Shin, MD  insulin lispro (HUMALOG KWIKPEN) 100 UNIT/ML KiwkPen Inject 0.15 mLs (15 Units total) into the skin 3 (three) times daily with meals. And pen needles 4/day Patient not taking: Reported on 03/05/2017 04/30/16   Renato Shin, MD  losartan-hydrochlorothiazide (HYZAAR) 100-25 MG tablet Take 1 tablet by mouth daily. 12/24/15   [provider]  metFORMIN (GLUCOPHAGE) 1000 MG tablet Take 1,000 mg by mouth 2 (two) times daily. 10/21/15   [provider]  multivitamin-iron-minerals-folic acid (CENTRUM) chewable tablet Chew 1 tablet by mouth daily.    [provider]  oxyCODONE (OXY IR/ROXICODONE) 5 MG immediate release tablet Take 1 tablet (5 mg total) by mouth every 6 (six) hours as needed for moderate pain. 03/07/17   Vaughan Basta, MD  oxyCODONE-acetaminophen (PERCOCET) 5-325 MG tablet Take 1-2 tablets by mouth every 6 (six) hours as needed. 04/03/17   Earleen Newport, MD  senna (SENOKOT) 8.6 MG TABS tablet Take 2 tablets (17.2 mg total) by mouth 2 (two) times daily. 01/12/16   Corky Sing, PA-C  simvastatin (ZOCOR) 20 MG tablet TAKE 1 TABLET BY MOUTH IN THE EVENING 05/02/16   Renato Shin, MD  sulfamethoxazole-trimethoprim (BACTRIM DS) 800-160 MG tablet Take 1 tablet by mouth 2 (two) times daily. 04/03/17   Earleen Newport, MD  TRADJENTA 5 MG TABS tablet Take 5 mg by mouth daily. 03/01/17   [provider]  traMADol (ULTRAM) 50 MG tablet Take 1 tablet (50 mg total) by mouth every 6 (six) hours as needed. 08/30/17 08/30/18  Sable Feil, PA-C    Allergies No known allergies  Family History  Problem Relation Age of Onset  . Diabetes Mother   . Diabetes Other     Social History Social History   Tobacco Use  . Smoking status: Never Smoker  . Smokeless tobacco: Never Used    Substance Use Topics  . Alcohol use: Yes    Comment: occasional   . Drug use: No    Review of Systems Constitutional: No fever/chills Eyes: No visual changes. ENT: No sore throat. Cardiovascular: Denies chest pain. Respiratory: Denies shortness of breath. Gastrointestinal: No abdominal pain.  No nausea, no vomiting.  No diarrhea.  No constipation. Genitourinary: Negative for dysuria. Musculoskeletal: Negative for back pain. Skin: Negative for rash. Neurological: Negative for headaches, focal weakness or numbness. Endocrine:Diabetes and hypertension. Hematological/Lymphatic: Allergic/Immunilogical: Questionable allergic reaction to Bactrim. ____________________________________________   PHYSICAL EXAM:  VITAL SIGNS: ED Triage Vitals  Enc Vitals Group     BP 08/30/17 1100 (!) 177/88     Pulse Rate 08/30/17 1100 90     Resp 08/30/17 1100 18     Temp 08/30/17 1100 98 F (36.7 C)     Temp Source 08/30/17 1100 Oral  SpO2 08/30/17 1100 96 %     Weight 08/30/17 1102 270 lb (122.5 kg)     Height 08/30/17 1102 5\' 10"  (1.778 m)     Head Circumference --      Peak Flow --      Pain Score 08/30/17 1101 10     Pain Loc --      Pain Edu? --      Excl. in [place]? --    Constitutional: Alert and oriented. Well appearing and in no acute distress. Cardiovascular: Normal rate, regular rhythm. Grossly normal heart sounds.  Good peripheral circulation.  Elevated blood pressure. Respiratory: Normal respiratory effort.  No retractions. Lungs CTAB. Gastrointestinal: Soft and nontender. No distention. No abdominal bruits. No CVA tenderness. Musculoskeletal: No lower extremity tenderness nor edema.  No joint effusions. Neurologic:  Normal speech and language. No gross focal neurologic deficits are appreciated. No gait instability. Skin: Edema and erythema to the lateral aspect of the left foot.   Psychiatric: Mood and affect are normal. Speech and behavior are  normal.  ____________________________________________   LABS (all labs ordered are listed, but only abnormal results are displayed)  Labs Reviewed  COMPREHENSIVE METABOLIC PANEL - Abnormal; Notable for the following components:      Result Value   Glucose, Bld 152 (*)    All other components within normal limits  CBC WITH DIFFERENTIAL/PLATELET   ____________________________________________  EKG  ____________________________________________  RADIOLOGY  ED MD interpretation: X-ray shows degenerative changes at the base of the fifth metatarsal.  There is soft tissue edema to lateral aspect of the foot.  Official radiology report(s): Dg Foot Complete Left  Result Date: 08/30/2017 CLINICAL DATA:  57 year old male with diabetes and progressive left lateral foot wound at the base of the 5th metatarsal. EXAM: LEFT FOOT - COMPLETE 3+ VIEW COMPARISON:  Left foot MRI and radiographs 04/03/2017, and earlier. FINDINGS: Chronic sclerosis and osteophytosis at the base of the left 5th metatarsal appears progressed since 2018 but without cortical osteolysis. Overlying soft tissue swelling. No subcutaneous gas identified. The other left foot osseous structures appear stable since 2018. Pes planus. Tarsal bone osteophytosis. Calcified peripheral vascular disease. IMPRESSION: 1. There is an increased degenerative appearance of the base of the 5th metatarsal since 2018, but there is no radiographic evidence of osteomyelitis. 2. Soft tissue swelling of the lateral foot with no subcutaneous gas. Electronically Signed   By: Genevie Ann M.D.   On: 08/30/2017 12:09    ____________________________________________   PROCEDURES  Procedure(s) performed:   Procedures  Critical Care performed: No  ____________________________________________   INITIAL IMPRESSION / ASSESSMENT AND PLAN / ED COURSE  As part of my medical decision making, I reviewed the following data within the electronic MEDICAL RECORD NUMBER     Cellulitis left foot.  Discussed labs and x-ray results with patient.  Patient given discharge care instruction advised to follow-up with the wound clinic by calling for an appointment in 2 days.  Patient started on doxycycline and Cipro.  Patient advised to take tramadol as needed for pain.      ____________________________________________   FINAL CLINICAL IMPRESSION(S) / ED DIAGNOSES  Final diagnoses:  Cellulitis of left foot     ED Discharge Orders        Ordered    ciprofloxacin (CIPRO) 500 MG tablet  2 times daily     08/30/17 1258    doxycycline (MONODOX) 100 MG capsule  2 times daily     08/30/17 1258  traMADol (ULTRAM) 50 MG tablet  Every 6 hours PRN     08/30/17 1258       Note:  This document was prepared using Dragon voice recognition software and may include unintentional dictation errors.    Sable Feil, PA-C 08/30/17 1308    Lisa Roca, MD 08/30/17 [phone]

## 2017-08-30 NOTE — ED Triage Notes (Signed)
L foot pain x 2 weeks. States history of infections in same foot. History of diabetes. States has not checked blood sugar in "a few days".

## 2017-08-30 NOTE — ED Notes (Signed)
Pt reports that he broke his foot approx 3 years ago.  Reports that he has had a previous infection in the same foot.  Pt states that there is a previous hx of MRSA in this foot.  Pt is A&Ox4, in NAD.  Ambulatory with steady gait.

## 2017-08-30 NOTE — ED Notes (Signed)
Pt discharged to home.  Family member driving.  Discharge instructions reviewed.  Verbalized understanding.  No questions or concerns at this time.  Teach back verified.  Pt in NAD.  No items left in ED.   

## 2017-08-30 NOTE — Discharge Instructions (Signed)
Advised to start antibiotics and contact the wound clinic in 2 days to schedule follow-up appointment for reevaluation.

## 2017-09-02 ENCOUNTER — Encounter: Payer: Commercial Managed Care - PPO | Attending: Physician Assistant | Admitting: Physician Assistant

## 2017-09-02 DIAGNOSIS — E11621 Type 2 diabetes mellitus with foot ulcer: Secondary | ICD-10-CM | POA: Insufficient documentation

## 2017-09-02 DIAGNOSIS — Z8249 Family history of ischemic heart disease and other diseases of the circulatory system: Secondary | ICD-10-CM | POA: Insufficient documentation

## 2017-09-02 DIAGNOSIS — I10 Essential (primary) hypertension: Secondary | ICD-10-CM | POA: Insufficient documentation

## 2017-09-02 DIAGNOSIS — Z823 Family history of stroke: Secondary | ICD-10-CM | POA: Insufficient documentation

## 2017-09-02 DIAGNOSIS — L97512 Non-pressure chronic ulcer of other part of right foot with fat layer exposed: Secondary | ICD-10-CM | POA: Insufficient documentation

## 2017-09-02 DIAGNOSIS — E1142 Type 2 diabetes mellitus with diabetic polyneuropathy: Secondary | ICD-10-CM | POA: Insufficient documentation

## 2017-09-02 DIAGNOSIS — L97529 Non-pressure chronic ulcer of other part of left foot with unspecified severity: Secondary | ICD-10-CM | POA: Insufficient documentation

## 2017-09-02 DIAGNOSIS — L03116 Cellulitis of left lower limb: Secondary | ICD-10-CM | POA: Insufficient documentation

## 2017-09-02 DIAGNOSIS — Z794 Long term (current) use of insulin: Secondary | ICD-10-CM | POA: Insufficient documentation

## 2017-09-02 DIAGNOSIS — E11622 Type 2 diabetes mellitus with other skin ulcer: Secondary | ICD-10-CM | POA: Insufficient documentation

## 2017-09-04 NOTE — Progress Notes (Signed)
HARACE, MCCLUNEY ([id number]) Visit Report for 09/02/2017 Chief Complaint Document Details Patient Name: [name], [name]. Date of Service: 09/02/2017 8:00 AM Medical Record Number: [id number] Patient Account Number: [id number] Date of Birth/Sex: 16-Jul-1960 (57 y.o. Male) Treating RN: Ahmed Prima Primary Care Provider: Lorelee Market Other Clinician: Referring Provider: Lisa Roca Treating Provider/Extender: Melburn Hake, Antoin Dargis Weeks in Treatment: 0 Information Obtained from: Patient Chief Complaint Left foot cellulitis and right 2nd toe ulcer Electronic Signature(s) Signed: 09/02/2017 7:21:18 PM By: Worthy Keeler PA-C Entered By: Worthy Keeler on 09/02/2017 15:36:39 [name] ([id number]) -------------------------------------------------------------------------------- Debridement Details Patient Name: [name]. Date of Service: 09/02/2017 8:00 AM Medical Record Number: [id number] Patient Account Number: [id number] Date of Birth/Sex: 1960/12/04 (57 y.o. Male) Treating RN: Ahmed Prima Primary Care Provider: Lorelee Market Other Clinician: Referring Provider: Lisa Roca Treating Provider/Extender: Melburn Hake, Therin Vetsch Weeks in Treatment: 0 Debridement Performed for Wound #10 Left,Lateral Foot Assessment: Performed By: Physician STONE III, Ashaun Gaughan E., PA-C Debridement: Open Wound/Selective Severity of Tissue Pre Fat layer exposed Debridement: Debridement Description: Selective Pre-procedure Verification/Time Yes - 08:56 Out Taken: Start Time: 08:57 Pain Control: Lidocaine 4% Topical Solution Level: Non-Viable Tissue Total Area Debrided (L x W): 0.1 (cm) x 0.1 (cm) = 0.01 (cm) Tissue and other material Non-Viable, Callus debrided: Instrument: Curette Bleeding: None End Time: 09:00 Procedural Pain: 0 Post Procedural Pain: 0 Response to Treatment: Procedure was tolerated well Post Debridement Measurements of Total Wound Length: (cm) 0.1 Width: (cm)  0.1 Depth: (cm) 0.1 Volume: (cm) 0.001 Character of Wound/Ulcer Post Debridement: Stable Severity of Tissue Post Debridement: Fat layer exposed Post Procedure Diagnosis Same as Pre-procedure Electronic Signature(s) Signed: 09/02/2017 7:21:18 PM By: Worthy Keeler PA-C Signed: 09/03/2017 4:15:20 PM By: Alric Quan Entered By: Alric Quan on 09/02/2017 09:05:32 [name] ([id number]) -------------------------------------------------------------------------------- HPI Details Patient Name: [name]. Date of Service: 09/02/2017 8:00 AM Medical Record Number: [id number] Patient Account Number: [id number] Date of Birth/Sex: [birth year] (57 y.o. Male) Treating RN: Ahmed Prima Primary Care Provider: Lorelee Market Other Clinician: Referring Provider: Lisa Roca Treating Provider/Extender: Melburn Hake, Nicklos Gaxiola Weeks in Treatment: 0 History of Present Illness HPI Description: Pleasant 57 year old with history of diabetes (Hgb A1c 10.8 in 2014) and peripheral neuropathy. No PVD. L ABI 1.1. Status post right great toe partial amputation years ago. He was at work and on 10/22/2014, was injured by a cart, and suffered an ulceration to his left anterior calf. He says that it subsequently became infected, and he was treated with a course of antibiotics. He was found on initial exam to have an ulceration on the dorsum of his left third toe. He was unaware of this and attributes it to pressure from his steel toed boots. More recently he injured his right anterior calf on a cart. Ambulating normally per his baseline. He has been undergoing regular debridements, applying mupirocin cream, and an Ace wrap for edema control. He returns to clinic for follow-up and is without complaints. No pain. No fever or chills. No drainage. 10/25/15; this is a 57 year old man who has type II diabetes with diabetic polyneuropathy. He tells me that he fractured his left fifth metatarsal in June  2016 when he presented with swelling. He does not recall a specific injury. His hemoglobin A1c was apparently too high at the time for consideration of surgery and he was put in some form of offloading. Ultimately he went to surgery in December with an allograft from his calcaneus to this site, plate and screws.  He had an x-ray of the foot in March that showed concerns about nonunion. He tells me that in March he had to move and basically moved himself. He was on his foot a lot and then noticed some drainage from an open area. He has been following with his orthopedic surgeon Dr. Doran Durand. He has been applying a felt donut, dry dressing and using his heel healing sandal. 11/01/15; this is a patient I saw last week for the first time. He had a small open wound on the plantar aspect of his left foot at roughly the level of the base of his fifth metatarsal. He had a considerable degree of thickened skin around this wound on the plantar aspect which I thought was from chronic pressure on this area. He tells [place] that he had drainage over the course of the week. No systemic symptoms. 11/08/15; culture last week grew Citrobacter korseri. This should've been sensitive to the Augmentin I gave him. He has seen Dr. Doran Durand who did his initial surgery and according the patient the plan is to give this another month and then the hardware might need to come out of this. This seems like a reasonable plan. I will adjust his antibiotics to ciprofloxacin which probably should continue for at least another 2 weeks. I gave him 10 days worth today 11/22/15 the patient has completed antibiotics. He has an appointment with Dr. Doran Durand this Friday. There is improved dimensions around the wound on the left fifth metatarsal base 11/29/15; the patient has completed antibiotics last week. Apparently his appointment with Dr. Doran Durand it is not until this Friday. Dimensions are roughly the same. 12/06/15; saw Dr. Doran Durand. No x-ray told the  end of the month, next appointment June 30. We have been using Aquacel Ag 12/13/15: No major change this week. Using Aquacel AG 621/17; arrived this week with maceration around the wound. There was quite a bit of undermining which required surgical debridement. I changed him to [facility] last week, by the patient's admission he was up on this more this week 12/27/15; macerated tissue around the wound is removed with a scalpel and pickups. There is no undermining. Nonviable subcutaneous tissue and skin taken from the superior circumference of the wound is slough from the surface. READMISSION 03/06/16 since I last saw this patient at the end of June, he went for surgery on 01/11/16 by Dr. Doran Durand of orthopedics. He had a left foot irrigation and debridement, removal of hardware and placement of wound VAC. He is also been followed by Dr. Megan Salon of infectious disease and completed a six-week course of IV Rocephin for group B strep and Enterobacter in the bone at the time of surgery. Apparently at the time of surgery the bone looked healthy so I don't think any bone was actually removed. He has been using silver alginate based dressings on the same wound area at the base of the left fifth metatarsal on the left. I note that he is also had arterial studies on 01/08/16, these showed a left ABI of 1.2 to and a right ABI of 1.3. Waveforms were listed as biphasic. He was not felt to have any specific arterial issues. 03/13/16; no real change in the condition of the wound at the left lateral foot at roughly the base of his fifth metatarsal. Use silver alginate last week. [name], [name] ([id number]) 03/18/16 arrives today with no open area. Being suspicious of the overlying callus I. Some of this back although I see nothing but covering  tissue here/epithelium. There is no surrounding tenderness READMISSION 04/16/16 this is a patient I discharged about a month ago. Initially a surgical wound on the lateral  aspect of his left foot which subsequently became infected. The story he is giving today that he went back into his own modified shoe started to notice pain 2 weeks ago he was seen in Dr. Nona Dell office by a physician assistant last Wednesday and according the patient was told that everything looked fine however this is clearly now broken open and he has an open wound in the same spot that we have been dealing with repetitively. Situation is complicated by the fact that he is running short of money on long-term disability. He has not taken his insulin and at least 2 weeks was previously on NovoLog short acting insulin on a sliding scale and TRESIBA 35 units at bedtime. He is no longer able to afford any of his medication he was in the x-ray on 10/15. A plain x-ray showed healed fracture of the left fifth metatarsal bone status post removal of the associated plate and screw fixation hardware. There was no acute appearing osseous abnormality. His blood work showed a white count of 9.2 with an essentially normal differential comprehensive metabolic panel was normal. Previous CT scan of the foot on 01/08/16 showed no osteomyelitis previous vascular workup showed no evidence of significant PAD on 01/13/16 04/23/16; culture I did last week grew enterococcus [ampicillin sensitive] and MRSA. He saw infectious disease yesterday. They stop the clindamycin and ordered an MRI. This is not unreasonable. All the hardware is out of the foot at this point. 04/30/16 at this point in time we are still awaiting the results of the MRI at this point in time. Patient did have an area which appears to be somewhat macerated in the proximal portion of the wound where there is overlying necrotic skin that is doing nothing more than trapping fluid underneath. He continues to state that he does have some discomfort and again the concern is for the possibility of osteomyelitis hence the reason for the MRI order. We have been  using a silver alginate dressing but again I think the reason this with his macerated as it was is that the dressing obscene could not reach the entirety of the wound due to some necrotic skin covering the proximal portion. No bleeding noted at this point in time on initial evaluation. 05/07/16 we receive the results of patient's MRI which shows that he has no evidence of osteomyelitis. This obviously is excellent news. Patient was definitely happy to hear this. He tells me the wound appears to be doing very well at this point in time and is pleased with the progress currently. 05/15/16; as noted the patient did not have osteomyelitis. He has been released by infectious disease and orthopedics. His wound is still open he had a debridement last week but complain when he got home he "bleeding for half a day". He is not had any pain. We have been using silver alginate with Kerlix gauze wrap. 05/28/16; patient arrives today with the wound in much the same condition as last time. He has a small opening on the lateral aspect of his foot however with debridement there is clear undermining medially there is no real evidence of infection here and I didn't see any point in culturing this. One would have to wonder if this isn't a simple matter of in adequate offloading as he is been using a healing sandal. 06/05/16; the open  area is now on the plantar aspect of his foot and not decide. The wound almost appears to have "migrated". This was the term use by our intake nurse. 06/12/16; open area on the plantar aspect of his foot. Base of the wound looks very healthy. This will be his second week in a total contact cast 06/19/16; patient arrived today in a total contact cast. There was some expectation from our staff and myself that this area would be healed. Unfortunately the area was boggy and with rec pressure a fairly substantial amount of purulent drainage was obtained. Specimen obtained for culture. The  patient had no complaints of systemic problems including fever or chills or instability of his diabetes. There was no pain in the foot. Nevertheless a extensive debridement was required. 06/26/16; patient's culture from the abscess last week grew a combination of MRSA and ampicillin sensitive enterococcus. I had him on Augmentin and Septra however I have elected to give him a full 10 day course of Zyvox instead as I Recently treated this combination of organisms with Augmentin and Septra before. Arrives today with no systemic symptoms 07/03/16; the patient has 2 more treatments of Zyvox and then he is finished antibiotics the wound has improved now mostly on the lateral aspect of his foot. There is still some tenderness when he walks. 07/10/16; patient arrives today with Zyvox completed. He only has a small open area remaining. 07/24/16; she arrives here with no overt open area. The covering is thick callus/eschar. Nevertheless there is no open area here. He has some tenderness underneath the area but no overt infection is observed no drainage. The patient has a deformity in the foot with this area weekly to be exposed to more pressure in his foot where nevertheless that something we are going to have to deal with going forward. The patient has diabetic workboots and diabetic shoes. He has had a long difficult course with the area here. This started as a fracture at work. He had bone grafting from his calcaneus and screws. This got infected he had to have more surgery on the area. Bone at the time of that surgery I think showed enterococcus and group B strep. He had 6 weeks of Rocephin. Since then the area has waxed and waned in its difficulty. Recently he had an MRI in December that did not show osteomyelitis nevertheless he had an abscess that grew MRSA and enterococcus which I elected to treat with Zyvox. This was in December and this wound is actually "healed" over [name], [name]  ([id number]) 08/21/16; the patient came in today for his one-month follow-up visit. The area on the lateral aspect of his left foot looks much the same as some month ago. There is no evidence of an open wound here. However the patient tells me about a week after he went back to work he developed severe pain and swelling in the plantar aspect of his right foot first and fifth metatarsal heads. 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 He went to see his podiatrist at [facility]. She told him that he could not work on his feet. She told him to go back and his cam walking boot on the left. Not have open wounds obviously on the right. The patient is actually gone ahead and retired from his job because he does not feel he can work on his feet 09/18/16; patient comes back  in saying he recently had some pain on the lateral aspect of his left foot. Asked that we look at this. Other than that all of his wounds have a viable surface. He has diabetic shoes. He is retired from work. READMISSION Birdie Beveridge is a 57 year old man who we cared for for a prolonged period of time in late 2017 into March 2018. At that point he had suffered a fracture of his left fifth metatarsal at about the level of the base of the fifth metatarsal. He had surgical repair by Dr. Meryl Dare and he developed a very refractory wound over the fifth metatarsal. Ultimately this became infected he required hardware removal this eventually closed over and we discharged from the clinic in March of this year. He tells me as well until roughly 3 weeks ago when he developed increasing pain in the same area making it difficult for him to sleep. He was admitted to hospital from 9/5 through 9/7 with now an ulcer in the same area on the lateral aspect of his left foot at the level of the base of the metatarsal. X-rays and MRIs during this hospitalization were  negative. Wound culture showed a combination of Escherichia coli, Morganella and Pseudomonas. He was given IV Cipro and vancomycin the hospital and then discharged home on Cipro and Doxy. He returned to the ER on 9/16 with leg swelling and discomfort. I don't think anything was added at that point although he did have an ultrasound of the left leg that was negative for DVT. He was back in the ER on 10/4 again with pain in the area he was given Bactrim but states he had a significant allergic reaction to that which has abated once he stopped the Bactrim. I don't think he had a full course. He went to see his podiatrist yesterday who recommended some form of foot soak. He has a Darco forefoot offloading boot The patient is a type II diabetic on insulin. Tells me his recent hemoglobin A1c was 7. Arterial studies done in July 2017 showed an ABI in the right of 1.3 on the left 1.2 to biphasic waveforms bilaterally. He was not felt to have arterial disease. As noted the patient has had 2 MRIs most recently on 10/4 this showed no evidence of an abscess or osteomyelitis and no change since the prior study of 03/05/17 there was nonspecific edema on the dorsum of the foot. ABIs in this clinic today 1.2 which would be unchanged from his study done in July/17 04/15/17; now down to 1 small wound on the left lateral foot. We wrapped him and applied collagen last week and the foot is fairly macerated. 04/22/17; small wound on the left lateral foot however it has some undermining. Using collagen 04/29/17; small wound on the left lateral foot some surrounding callus. Chronic damage in this area as a result of initial fracture nonhealing and secondary infection. 05/06/17; the patient arrives today with the area on the left lateral foot closed. There is thick subcutaneous tissue with a layer of callus. I took some of the callus off just to ensure adequate closure of the underlying tissue and there is no open  wound here. He has considerable deformity of this area of his foot however and unless there is an ability to offload this I think opening is going to be recurrent. He tells me he has diabetic foot wear although I have not actually seen this Readmission: 09/02/17 on evaluation today patient appears to be doing a little  better compared to what he tells me what's going on in regard to his left lateral foot. He was previously seen in the ER this past Saturday it is now Tuesday. On Saturday he actually was treated with antibiotics including Cipro 500 mg two times a day and doxycycline 100 mg two times a day. Subsequently he also did have an x-ray performed of his left foot which revealed increased degenerative changes of the base of the fifth metatarsal since comparison in 2018. There was no radiographic evidence of osteomyelitis however. Patient has previously had an MRI of the left foot which was performed on 04/03/17 in this revealed at that point a soft tissue ulceration but no evidence of osteomyelitis. Patient has previously undergone testing in regard to his ABI's by Dr. Bridgett Larsson and this showed that he did have ABI was within normal limits which does correspond with ABI's we checked here in the office today. That study was on 01/13/16. With all that being said on evaluation today there does not appear to be any evidence of a true open wound of the left lateral fifth metatarsal region. He does have tenderness noted as well is a little bit of boggy/fluctuance feeling to the area in this region of his foot and there is definitely a very solid and firm callous noted laterally. With that being said I am not able to find any obvious opening at this point there does appear to be a spot where there appears to been a opening very recently however patient states he has not noted any drainage from this currently. He does however have discomfort with palpation of this area this is in the 3-4/10 range only with very  firm palpation this is not too significant at all. Subsequently he does have a small ulcer at the medial nail bed of the right second toe where he inadvertently pulled off a portion of the toenail softly causing this injury. Otherwise there does not appear to be any evidence of infection in regard to this right second toe. [name], [name] ([id number]) Electronic Signature(s) Signed: 09/02/2017 7:21:18 PM By: Worthy Keeler PA-C Entered By: Worthy Keeler on 09/02/2017 15:41:28 NAGI, FURIO ([id number]) -------------------------------------------------------------------------------- Physical Exam Details Patient Name: REMER, COUSE. Date of Service: 09/02/2017 8:00 AM Medical Record Number: [id number] Patient Account Number: [id number] Date of Birth/Sex: [birth year] (57 y.o. Male) Treating RN: Ahmed Prima Primary Care Provider: Lorelee Market Other Clinician: Referring Provider: Lisa Roca Treating Provider/Extender: Melburn Hake, Jeanae Whitmill Weeks in Treatment: 0 Constitutional patient is hypertensive.. pulse regular and within target range for patient.Marland Kitchen respirations regular, non-labored and within target range for patient.Marland Kitchen temperature within target range for patient.. Well-nourished and well-hydrated in no acute distress. Eyes conjunctiva clear no eyelid edema noted. pupils equal round and reactive to light and accommodation. Ears, Nose, Mouth, and Throat no gross abnormality of ear auricles or external auditory canals. normal hearing noted during conversation. mucus membranes moist. Respiratory normal breathing without difficulty. clear to auscultation bilaterally. Cardiovascular regular rate and rhythm with normal S1, S2. 1+ dorsalis pedis/posterior tibialis pulses. no clubbing, cyanosis, significant edema, <3 sec cap refill. Gastrointestinal (GI) soft, non-tender, non-distended, +BS. no ventral hernia noted. Musculoskeletal normal gait and posture. no significant deformity  or arthritic changes, no loss or range of motion, no clubbing. Psychiatric this patient is able to make decisions and demonstrates good insight into disease process. Alert and Oriented x 3. pleasant and cooperative. Notes Patient's wound at this point in time on the right second toe  does not require any debridement at this point. With that being said this is just a very small area which I think will heal very well on the medial portion of the nail bed due to this having been pulled off. I could not find any specific opening and I did perform some callous pairing/like to debridement of the lateral fifth metatarsal region and did not find a specific opening during this process. Obviously this is good news and patient feels like this may be getting better anyway which is also excellent news. Electronic Signature(s) Signed: 09/02/2017 7:21:18 PM By: Worthy Keeler PA-C Entered By: Worthy Keeler on 09/02/2017 15:42:57 [name] ([id number]) -------------------------------------------------------------------------------- Physician Orders Details Patient Name: TITAN, KARNER. Date of Service: 09/02/2017 8:00 AM Medical Record Number: [id number] Patient Account Number: [id number] Date of Birth/Sex: 1960-11-29 (57 y.o. Male) Treating RN: Ahmed Prima Primary Care Provider: Lorelee Market Other Clinician: Referring Provider: Lisa Roca Treating Provider/Extender: Melburn Hake, Velma Agnes Weeks in Treatment: 0 Verbal / Phone Orders: Yes Clinician: Carolyne Fiscal, Debi Read Back and Verified: Yes Diagnosis Coding ICD-10 Coding Code Description E11.621 Type 2 diabetes mellitus with foot ulcer L97.512 Non-pressure chronic ulcer of other part of right foot with fat layer exposed L97.528 Non-pressure chronic ulcer of other part of left foot with other specified severity Wound Cleansing Wound #10 Left,Lateral Foot o Clean wound with Normal Saline. o Cleanse wound with mild soap and  water Wound #11 Right Toe Second o Clean wound with Normal Saline. o Cleanse wound with mild soap and water Anesthetic (add to Medication List) Wound #10 Left,Lateral Foot o Topical Lidocaine 4% cream applied to wound bed prior to debridement (In Clinic Only). Wound #11 Right Toe Second o Topical Lidocaine 4% cream applied to wound bed prior to debridement (In Clinic Only). Skin Barriers/Peri-Wound Care Wound #10 Left,Lateral Foot o Skin Prep Primary Wound Dressing Wound #11 Right Toe Second o Xeroform Secondary Dressing Wound #10 [place] Wound #11 Right Toe Second o Dry Gauze o Conform/Kerlix Dressing Change Frequency Wound #10 Left,Lateral Foot o Change dressing every other day. MINORU, CHAP ([id number]) Wound #11 Right Toe Second o Change dressing every day. Follow-up Appointments Wound #10 Left,Lateral Foot o Return Appointment in 1 week. Wound #11 Right Toe Second o Return Appointment in 1 week. Edema Control Wound #10 Left,Lateral Foot o Elevate legs to the level of the heart and pump ankles as often as possible Wound #11 Right Toe Second o Elevate legs to the level of the heart and pump ankles as often as possible Additional Orders / Instructions Wound #10 Left,Lateral Foot o Increase protein intake. o Activity as tolerated Wound #11 Right Toe Second o Increase protein intake. o Activity as tolerated Patient Medications Allergies: Bactrim Notifications Medication Indication Start End lidocaine DOSE 1 - topical 4 % cream - 1 cream topical Electronic Signature(s) Signed: 09/02/2017 7:21:18 PM By: Worthy Keeler PA-C Signed: 09/03/2017 4:15:20 PM By: Alric Quan Entered By: Alric Quan on 09/02/2017 09:08:16 [name], [name] ([id number]) -------------------------------------------------------------------------------- Prescription 09/02/2017 Patient Name: [name]. Provider: Worthy Keeler PA-C Date of Birth: [birth year] NPI#: [id number] Sex: [name] DEA#: [id number] Phone #: [phone] License #: Patient Address: [facility] [address] [place], [place] [address] [place], [place] [address] [phone] Allergies Bactrim Medication Medication: Route: Strength: Form: lidocaine 4 % topical cream topical 4% cream Class: TOPICAL LOCAL  ANESTHETICS Dose: Frequency / Time: Indication: 1 1 cream topical Number of Refills: Number of Units: 0 Generic Substitution: Start Date: End Date: One Time Use: Substitution Permitted No Note to Pharmacy: Signature(s): Date(s): Electronic Signature(s) Signed: 09/02/2017 7:21:18 PM By: Worthy Keeler PA-C Signed: 09/03/2017 4:15:20 PM By: Alric Quan Entered By: Alric Quan on 09/02/2017 09:08:17 [name] ([id number]) --------------------------------------------------------------------------------  Problem List Details Patient Name: VALERIANO, BAIN. Date of Service: 09/02/2017 8:00 AM Medical Record Number: [id number] Patient Account Number: [id number] Date of Birth/Sex: 05-Apr-1961 (57 y.o. Male) Treating RN: Ahmed Prima Primary Care Provider: Lorelee Market Other Clinician: Referring Provider: Lisa Roca Treating Provider/Extender: Worthy Keeler Weeks in Treatment: 0 Active Problems ICD-10 Encounter Code Description Active Date Diagnosis E11.621 Type 2 diabetes mellitus with foot ulcer 09/02/2017 Yes L97.512 Non-pressure chronic ulcer of other part of right foot with fat layer 09/02/2017 Yes exposed L97.528 Non-pressure chronic ulcer of other part of left foot with other 09/02/2017 Yes specified severity Inactive Problems Resolved Problems Electronic Signature(s) Signed: 09/02/2017 7:21:18 PM By: Worthy Keeler PA-C Entered By: Worthy Keeler on 09/02/2017  08:30:46 [name] ([id number]) -------------------------------------------------------------------------------- Progress Note/History and Physical Details Patient Name: [name]. Date of Service: 09/02/2017 8:00 AM Medical Record Number: [id number] Patient Account Number: [id number] Date of Birth/Sex: [birth year] (57 y.o. Male) Treating RN: Ahmed Prima Primary Care Provider: Lorelee Market Other Clinician: Referring Provider: Lisa Roca Treating Provider/Extender: Melburn Hake, Jamelia Varano Weeks in Treatment: 0 Subjective Chief Complaint Information obtained from Patient Left foot cellulitis and right 2nd toe ulcer History of Present Illness (HPI) Pleasant 57 year old with history of diabetes (Hgb A1c 10.8 in 2014) and peripheral neuropathy. No PVD. L ABI 1.1. Status post right great toe partial amputation years ago. He was at work and on 10/22/2014, was injured by a cart, and suffered an ulceration to his left anterior calf. He says that it subsequently became infected, and he was treated with a course of antibiotics. He was found on initial exam to have an ulceration on the dorsum of his left third toe. He was unaware of this and attributes it to pressure from his steel toed boots. More recently he injured his right anterior calf on a cart. Ambulating normally per his baseline. He has been undergoing regular debridements, applying mupirocin cream, and an Ace wrap for edema control. He returns to clinic for follow-up and is without complaints. No pain. No fever or chills. No drainage. 10/25/15; this is a 57 year old man who has type II diabetes with diabetic polyneuropathy. He tells me that he fractured his left fifth metatarsal in June 2016 when he presented with swelling. He does not recall a specific injury. His hemoglobin A1c was apparently too high at the time for consideration of surgery and he was put in some form of offloading. Ultimately he went to surgery in  December with an allograft from his calcaneus to this site, plate and screws. He had an x-ray of the foot in March that showed concerns about nonunion. He tells me that in March he had to move and basically moved himself. He was on his foot a lot and then noticed some drainage from an open area. He has been following with his orthopedic surgeon Dr. Doran Durand. He has been applying a felt donut, dry dressing and using his heel healing sandal. 11/01/15; this is a patient I saw last week for the first time. He had a small open wound on the plantar aspect of his left foot at  roughly the level of the base of his fifth metatarsal. He had a considerable degree of thickened skin around this wound on the plantar aspect which I thought was from chronic pressure on this area. He tells [place] that he had drainage over the course of the week. No systemic symptoms. 11/08/15; culture last week grew Citrobacter korseri. This should've been sensitive to the Augmentin I gave him. He has seen Dr. Doran Durand who did his initial surgery and according the patient the plan is to give this another month and then the hardware might need to come out of this. This seems like a reasonable plan. I will adjust his antibiotics to ciprofloxacin which probably should continue for at least another 2 weeks. I gave him 10 days worth today 11/22/15 the patient has completed antibiotics. He has an appointment with Dr. Doran Durand this Friday. There is improved dimensions around the wound on the left fifth metatarsal base 11/29/15; the patient has completed antibiotics last week. Apparently his appointment with Dr. Doran Durand it is not until this Friday. Dimensions are roughly the same. 12/06/15; saw Dr. Doran Durand. No x-ray told the end of the month, next appointment June 30. We have been using Aquacel Ag 12/13/15: No major change this week. Using Aquacel AG 621/17; arrived this week with maceration around the wound. There was quite a bit of undermining which required  surgical debridement. I changed him to [facility] last week, by the patient's admission he was up on this more this week 12/27/15; macerated tissue around the wound is removed with a scalpel and pickups. There is no undermining. Nonviable subcutaneous tissue and skin taken from the superior circumference of the wound is slough from the surface. READMISSION 03/06/16 since I last saw this patient at the end of June, he went for surgery on 01/11/16 by Dr. Doran Durand of orthopedics. He had a left foot irrigation and debridement, removal of hardware and placement of wound VAC. He is also been followed by Dr. Megan Salon of infectious disease and completed a six-week course of IV Rocephin for group B strep and Enterobacter in the KIJANA, CROMIE. ([id number]) bone at the time of surgery. Apparently at the time of surgery the bone looked healthy so I don't think any bone was actually removed. He has been using silver alginate based dressings on the same wound area at the base of the left fifth metatarsal on the left. I note that he is also had arterial studies on 01/08/16, these showed a left ABI of 1.2 to and a right ABI of 1.3. Waveforms were listed as biphasic. He was not felt to have any specific arterial issues. 03/13/16; no real change in the condition of the wound at the left lateral foot at roughly the base of his fifth metatarsal. Use silver alginate last week. 03/18/16 arrives today with no open area. Being suspicious of the overlying callus I. Some of this back although I see nothing but covering tissue here/epithelium. There is no surrounding tenderness READMISSION 04/16/16 this is a patient I discharged about a month ago. Initially a surgical wound on the lateral aspect of his left foot which subsequently became infected. The story he is giving today that he went back into his own modified shoe started to notice pain 2 weeks ago he was seen in Dr. Nona Dell office by a physician assistant last  Wednesday and according the patient was told that everything looked fine however this is clearly now broken open and he has an open wound in the same  spot that we have been dealing with repetitively. Situation is complicated by the fact that he is running short of money on long-term disability. He has not taken his insulin and at least 2 weeks was previously on NovoLog short acting insulin on a sliding scale and TRESIBA 35 units at bedtime. He is no longer able to afford any of his medication he was in the x-ray on 10/15. A plain x-ray showed healed fracture of the left fifth metatarsal bone status post removal of the associated plate and screw fixation hardware. There was no acute appearing osseous abnormality. His blood work showed a white count of 9.2 with an essentially normal differential comprehensive metabolic panel was normal. Previous CT scan of the foot on 01/08/16 showed no osteomyelitis previous vascular workup showed no evidence of significant PAD on 01/13/16 04/23/16; culture I did last week grew enterococcus [ampicillin sensitive] and MRSA. He saw infectious disease yesterday. They stop the clindamycin and ordered an MRI. This is not unreasonable. All the hardware is out of the foot at this point. 04/30/16 at this point in time we are still awaiting the results of the MRI at this point in time. Patient did have an area which appears to be somewhat macerated in the proximal portion of the wound where there is overlying necrotic skin that is doing nothing more than trapping fluid underneath. He continues to state that he does have some discomfort and again the concern is for the possibility of osteomyelitis hence the reason for the MRI order. We have been using a silver alginate dressing but again I think the reason this with his macerated as it was is that the dressing obscene could not reach the entirety of the wound due to some necrotic skin covering the proximal portion. No bleeding  noted at this point in time on initial evaluation. 05/07/16 we receive the results of patient's MRI which shows that he has no evidence of osteomyelitis. This obviously is excellent news. Patient was definitely happy to hear this. He tells me the wound appears to be doing very well at this point in time and is pleased with the progress currently. 05/15/16; as noted the patient did not have osteomyelitis. He has been released by infectious disease and orthopedics. His wound is still open he had a debridement last week but complain when he got home he "bleeding for half a day". He is not had any pain. We have been using silver alginate with Kerlix gauze wrap. 05/28/16; patient arrives today with the wound in much the same condition as last time. He has a small opening on the lateral aspect of his foot however with debridement there is clear undermining medially there is no real evidence of infection here and I didn't see any point in culturing this. One would have to wonder if this isn't a simple matter of in adequate offloading as he is been using a healing sandal. 06/05/16; the open area is now on the plantar aspect of his foot and not decide. The wound almost appears to have "migrated". This was the term use by our intake nurse. 06/12/16; open area on the plantar aspect of his foot. Base of the wound looks very healthy. This will be his second week in a total contact cast 06/19/16; patient arrived today in a total contact cast. There was some expectation from our staff and myself that this area would be healed. Unfortunately the area was boggy and with rec pressure a fairly substantial amount of purulent drainage  was obtained. Specimen obtained for culture. The patient had no complaints of systemic problems including fever or chills or instability of his diabetes. There was no pain in the foot. Nevertheless a extensive debridement was required. 06/26/16; patient's culture from the abscess last week  grew a combination of MRSA and ampicillin sensitive enterococcus. I had him on Augmentin and Septra however I have elected to give him a full 10 day course of Zyvox instead as I Recently treated this combination of organisms with Augmentin and Septra before. Arrives today with no systemic symptoms 07/03/16; the patient has 2 more treatments of Zyvox and then he is finished antibiotics the wound has improved now mostly on the lateral aspect of his foot. There is still some tenderness when he walks. 07/10/16; patient arrives today with Zyvox completed. He only has a small open area remaining. 07/24/16; she arrives here with no overt open area. The covering is thick callus/eschar. Nevertheless there is no open area here. He has some tenderness underneath the area but no overt infection is observed no drainage. The patient has a deformity in the foot with this area weekly to be exposed to more pressure in his foot where nevertheless that something we are going to have to deal with going forward. The patient has diabetic workboots and diabetic shoes. He has had a long ARMANY, MANO. ([id number]) difficult course with the area here. This started as a fracture at work. He had bone grafting from his calcaneus and screws. This got infected he had to have more surgery on the area. Bone at the time of that surgery I think showed enterococcus and group B strep. He had 6 weeks of Rocephin. Since then the area has waxed and waned in its difficulty. Recently he had an MRI in December that did not show osteomyelitis nevertheless he had an abscess that grew MRSA and enterococcus which I elected to treat with Zyvox. This was in December and this wound is actually "healed" over 08/21/16; the patient came in today for his one-month follow-up visit. The area on the lateral aspect of his left foot looks much the same as some month ago. There is no evidence of an open wound here. However the patient tells me about a week  after he went back to work he developed severe pain and swelling in the plantar aspect of his right foot first and fifth metatarsal heads. 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 He went to see his podiatrist at [facility]. She told him that he could not work on his feet. She told him to go back and his cam walking boot on the left. Not have open wounds obviously on the right. The patient is actually gone ahead and retired from his job because he does not feel he can work on his feet 09/18/16; patient comes back in saying he recently had some pain on the lateral aspect of his left foot. Asked that we look at this. Other than that all of his wounds have a viable surface. He has diabetic shoes. He is retired from work. READMISSION [name] is a 57 year old man who we cared for for a prolonged period of time in late 2017 into March 2018. At that point he had suffered a fracture of his left fifth metatarsal at about the level of the base of the fifth metatarsal. He had surgical repair by Dr. Meryl Dare and he  developed a very refractory wound over the fifth metatarsal. Ultimately this became infected he required hardware removal this eventually closed over and we discharged from the clinic in March of this year. He tells me as well until roughly 3 weeks ago when he developed increasing pain in the same area making it difficult for him to sleep. He was admitted to hospital from 9/5 through 9/7 with now an ulcer in the same area on the lateral aspect of his left foot at the level of the base of the metatarsal. X-rays and MRIs during this hospitalization were negative. Wound culture showed a combination of Escherichia coli, Morganella and Pseudomonas. He was given IV Cipro and vancomycin the hospital and then discharged home on Cipro and Doxy. He returned to the ER on 9/16 with leg swelling and discomfort. I don't  think anything was added at that point although he did have an ultrasound of the left leg that was negative for DVT. He was back in the ER on 10/4 again with pain in the area he was given Bactrim but states he had a significant allergic reaction to that which has abated once he stopped the Bactrim. I don't think he had a full course. He went to see his podiatrist yesterday who recommended some form of foot soak. He has a Darco forefoot offloading boot The patient is a type II diabetic on insulin. Tells me his recent hemoglobin A1c was 7. Arterial studies done in July 2017 showed an ABI in the right of 1.3 on the left 1.2 to biphasic waveforms bilaterally. He was not felt to have arterial disease. As noted the patient has had 2 MRIs most recently on 10/4 this showed no evidence of an abscess or osteomyelitis and no change since the prior study of 03/05/17 there was nonspecific edema on the dorsum of the foot. ABIs in this clinic today 1.2 which would be unchanged from his study done in July/17 04/15/17; now down to 1 small wound on the left lateral foot. We wrapped him and applied collagen last week and the foot is fairly macerated. 04/22/17; small wound on the left lateral foot however it has some undermining. Using collagen 04/29/17; small wound on the left lateral foot some surrounding callus. Chronic damage in this area as a result of initial fracture nonhealing and secondary infection. 05/06/17; the patient arrives today with the area on the left lateral foot closed. There is thick subcutaneous tissue with a layer of callus. I took some of the callus off just to ensure adequate closure of the underlying tissue and there is no open wound here. He has considerable deformity of this area of his foot however and unless there is an ability to offload this I think opening is going to be recurrent. He tells me he has diabetic foot wear although I have not actually seen this Readmission: 09/02/17 on  evaluation today patient appears to be doing a little better compared to what he tells me what's going on in regard to his left lateral foot. He was previously seen in the ER this past Saturday it is now Tuesday. On Saturday he actually was treated with antibiotics including Cipro 500 mg two times a day and doxycycline 100 mg two times a day. Subsequently he also did have an x-ray performed of his left foot which revealed increased degenerative changes of the base of the fifth metatarsal since comparison in 2018. There was no radiographic evidence of osteomyelitis however. Patient has previously had an MRI  of the left foot which was performed on 04/03/17 in this revealed at that point a soft tissue ulceration but no evidence of osteomyelitis. Patient has previously undergone testing in regard to his ABI's by Dr. Bridgett Larsson and this showed that he did have ABI was within normal limits which does correspond with ABI's we checked here in the office today. That study was on 01/13/16. With all that being said on evaluation today there does not appear to be any evidence of a true open wound of the left lateral fifth metatarsal region. He does have tenderness noted as well is a little bit of boggy/fluctuance feeling to the [name], [name]. ([id number]) area in this region of his foot and there is definitely a very solid and firm callous noted laterally. With that being said I am not able to find any obvious opening at this point there does appear to be a spot where there appears to been a opening very recently however patient states he has not noted any drainage from this currently. He does however have discomfort with palpation of this area this is in the 3-4/10 range only with very firm palpation this is not too significant at all. Subsequently he does have a small ulcer at the medial nail bed of the right second toe where he inadvertently pulled off a portion of the toenail softly causing this injury. Otherwise  there does not appear to be any evidence of infection in regard to this right second toe. Wound History Patient presents with 2 open wounds that have been present for approximately 1 week. Patient has been treating wounds in the following manner: peroxide. Laboratory tests have not been performed in the last month. Patient reportedly has tested positive for an antibiotic resistant organism. Patient reportedly has not tested positive for osteomyelitis. Patient reportedly has not had testing performed to evaluate circulation in the legs. Patient History Information obtained from Patient. Allergies Bactrim Family History Diabetes - Mother,Father, Heart Disease - Father,Mother, Hypertension - Mother,Father, Stroke - Father, No family history of Cancer, Hereditary Spherocytosis, Kidney Disease, Lung Disease, Seizures, Thyroid Problems, Tuberculosis. Social History Never smoker, Marital Status - Married, Alcohol Use - Never, Drug Use - No History, Caffeine Use - Daily. Medical History Eyes Denies history of Cataracts, Glaucoma, Optic Neuritis Ear/Nose/Mouth/Throat Denies history of Chronic sinus problems/congestion, Middle ear problems Hematologic/Lymphatic Denies history of Anemia, Hemophilia, Human Immunodeficiency Virus, Lymphedema, Sickle Cell Disease Respiratory Denies history of Aspiration, Asthma, Chronic Obstructive Pulmonary Disease (COPD), Pneumothorax, Sleep Apnea, Tuberculosis Cardiovascular Patient has history of Hypertension Denies history of Angina, Arrhythmia, Congestive Heart Failure, Coronary Artery Disease, Deep Vein Thrombosis, Hypotension, Myocardial Infarction, Peripheral Arterial Disease, Peripheral Venous Disease, Phlebitis, Vasculitis Gastrointestinal Denies history of Cirrhosis , Colitis, Crohn s, Hepatitis A, Hepatitis B, Hepatitis C Endocrine Patient has history of Type II Diabetes Denies history of Type I Diabetes Genitourinary Denies history of End Stage  Renal Disease Immunological Denies history of Lupus Erythematosus, Raynaud s, Scleroderma Integumentary (Skin) Denies history of History of Burn, History of pressure wounds Musculoskeletal Denies history of Gout, Rheumatoid Arthritis, Osteoarthritis, Osteomyelitis Neurologic Patient has history of Neuropathy Denies history of Dementia, Quadriplegia, Paraplegia, Seizure Disorder Oncologic [name], [name]. ([id number]) Denies history of Received Chemotherapy, Received Radiation Psychiatric Denies history of Anorexia/bulimia, Confinement Anxiety Patient is treated with Insulin. Blood sugar is tested. Review of Systems (ROS) Eyes The patient has no complaints or symptoms. Ear/Nose/Mouth/Throat The patient has no complaints or symptoms. Hematologic/Lymphatic The patient has no complaints or symptoms. Respiratory The  patient has no complaints or symptoms. Cardiovascular The patient has no complaints or symptoms. Gastrointestinal The patient has no complaints or symptoms. Endocrine The patient has no complaints or symptoms. Genitourinary The patient has no complaints or symptoms. Immunological The patient has no complaints or symptoms. Integumentary (Skin) The patient has no complaints or symptoms. Musculoskeletal The patient has no complaints or symptoms. Neurologic The patient has no complaints or symptoms. Oncologic The patient has no complaints or symptoms. Psychiatric The patient has no complaints or symptoms. Objective Constitutional patient is hypertensive.. pulse regular and within target range for patient.Marland Kitchen respirations regular, non-labored and within target range for patient.Marland Kitchen temperature within target range for patient.. Well-nourished and well-hydrated in no acute distress. Vitals Time Taken: 8:10 AM, Height: 70 in, Source: Measured, Weight: 270 lbs, Source: Measured, BMI: 38.7, Temperature: 98.2 F, Pulse: 92 bpm, Respiratory Rate: 18 breaths/min, Blood  Pressure: 152/84 mmHg. Eyes conjunctiva clear no eyelid edema noted. pupils equal round and reactive to light and accommodation. Ears, Nose, Mouth, and Throat no gross abnormality of ear auricles or external auditory canals. normal hearing noted during conversation. mucus [name], [name]. ([id number]) membranes moist. Respiratory normal breathing without difficulty. clear to auscultation bilaterally. Cardiovascular regular rate and rhythm with normal S1, S2. 1+ dorsalis pedis/posterior tibialis pulses. no clubbing, cyanosis, significant edema, Gastrointestinal (GI) soft, non-tender, non-distended, +BS. no ventral hernia noted. Musculoskeletal normal gait and posture. no significant deformity or arthritic changes, no loss or range of motion, no clubbing. Psychiatric this patient is able to make decisions and demonstrates good insight into disease process. Alert and Oriented x 3. pleasant and cooperative. General Notes: Patient's wound at this point in time on the right second toe does not require any debridement at this point. With that being said this is just a very small area which I think will heal very well on the medial portion of the nail bed due to this having been pulled off. I could not find any specific opening and I did perform some callous pairing/like to debridement of the lateral fifth metatarsal region and did not find a specific opening during this process. Obviously this is good news and patient feels like this may be getting better anyway which is also excellent news. Integumentary (Hair, Skin) Wound #10 status is Open. Original cause of wound was Gradually Appeared. The wound is located on the Left,Lateral Foot. The wound measures 0.1cm length x 0.1cm width x 0.1cm depth; 0.008cm^2 area and 0.001cm^3 volume. There is no tunneling or undermining noted. There is a none present amount of drainage noted. The wound margin is flat and intact. There is no granulation within the  wound bed. There is no necrotic tissue within the wound bed. The periwound skin appearance exhibited: Callus. The periwound skin appearance did not exhibit: Crepitus, Excoriation, Induration, Rash, Scarring, Dry/Scaly, Maceration, Atrophie Blanche, Cyanosis, Ecchymosis, Hemosiderin Staining, Mottled, Pallor, Rubor, Erythema. Periwound temperature was noted as No Abnormality. The periwound has tenderness on palpation. Wound #11 status is Open. Original cause of wound was Gradually Appeared. The wound is located on the Right Toe Second. The wound measures 0.2cm length x 0.2cm width x 0.1cm depth; 0.031cm^2 area and 0.003cm^3 volume. There is no tunneling or undermining noted. There is a medium amount of serous drainage noted. The wound margin is flat and intact. There is large (67-100%) red granulation within the wound bed. There is no necrotic tissue within the wound bed. The periwound skin appearance exhibited: Callus. The periwound skin appearance did not exhibit: Crepitus,  Excoriation, Induration, Rash, Scarring, Dry/Scaly, Maceration, Atrophie Blanche, Cyanosis, Ecchymosis, Hemosiderin Staining, Mottled, Pallor, Rubor, Erythema. Periwound temperature was noted as No Abnormality. The periwound has tenderness on palpation. Assessment Active Problems ICD-10 E11.621 - Type 2 diabetes mellitus with foot ulcer L97.512 - Non-pressure chronic ulcer of other part of right foot with fat layer exposed L97.528 - Non-pressure chronic ulcer of other part of left foot with other specified severity JAISHAUN, MCNAB. ([id number]) Procedures Wound #10 Pre-procedure diagnosis of Wound #10 is a Diabetic Wound/Ulcer of the Lower Extremity located on the Left,Lateral Foot .Severity of Tissue Pre Debridement is: Fat layer exposed. There was a Non-Viable Tissue Open Wound/Selective [phone]) debridement with total area of 0.01 sq cm performed by STONE III, Anastasha Ortez E., PA-C. with the following instrument(s): Curette  to remove Non-Viable tissue/material including Callus after achieving pain control using Lidocaine 4% Topical Solution. A time out was conducted at 08:56, prior to the start of the procedure. There was no bleeding. The procedure was tolerated well with a pain level of 0 throughout and a pain level of 0 following the procedure. Post Debridement Measurements: 0.1cm length x 0.1cm width x 0.1cm depth; 0.001cm^3 volume. Character of Wound/Ulcer Post Debridement is stable. Severity of Tissue Post Debridement is: Fat layer exposed. Post procedure Diagnosis Wound #10: Same as Pre-Procedure Plan Wound Cleansing: Wound #10 Left,Lateral Foot: Clean wound with Normal Saline. Cleanse wound with mild soap and water Wound #11 Right Toe Second: Clean wound with Normal Saline. Cleanse wound with mild soap and water Anesthetic (add to Medication List): Wound #10 Left,Lateral Foot: Topical Lidocaine 4% cream applied to wound bed prior to debridement (In Clinic Only). Wound #11 Right Toe Second: Topical Lidocaine 4% cream applied to wound bed prior to debridement (In Clinic Only). Skin Barriers/Peri-Wound Care: Wound #10 Left,Lateral Foot: Skin Prep Primary Wound Dressing: Wound #11 Right Toe Second: Xeroform Secondary Dressing: Wound #10 Left,Lateral Foot: Foam [place] Wound #11 Right Toe Second: Dry Gauze Conform/Kerlix Dressing Change Frequency: Wound #10 Left,Lateral Foot: Change dressing every other day. Wound #11 Right Toe Second: Change dressing every day. Follow-up Appointments: Wound #10 Left,Lateral Foot: Return Appointment in 1 week. Wound #11 Right Toe Second: Return Appointment in 1 week. Edema Control: Wound #10 Left,Lateral Foot: Elevate legs to the level of the heart and pump ankles as often as possible DENZELL, COLASANTI. ([id number]) Wound #11 Right Toe Second: Elevate legs to the level of the heart and pump ankles as often as possible Additional Orders /  Instructions: Wound #10 Left,Lateral Foot: Increase protein intake. Activity as tolerated Wound #11 Right Toe Second: Increase protein intake. Activity as tolerated The following medication(s) was prescribed: lidocaine topical 4 % cream 1 1 cream topical was prescribed at facility At this point in regard to the fifth lateral metatarsal region I'm gonna recommend that patient continue with his current antibiotic regimen as I feel this is probably the most appropriate thing for him at this time. Subsequently I am going to suggest that we also initiate Xeroform for the right second toe I think this will heal rather nicely. We will just pad left lateral fifth metatarsal region. We will see him for reevaluation in one weeks time to see were things stand. Patient is in agreement with this plan. In the meantime he will keep a close eye on how things are progressing. Please see above for specific wound care orders. We will see patient for re-evaluation in 1 week(s) here in the clinic. If anything worsens or  changes patient will contact our office for additional recommendations. Electronic Signature(s) Signed: 09/02/2017 7:21:18 PM By: Worthy Keeler PA-C Entered By: Worthy Keeler on 09/02/2017 15:43:46 [name] ([id number]) -------------------------------------------------------------------------------- ROS/PFSH Details Patient Name: [name]. Date of Service: 09/02/2017 8:00 AM Medical Record Number: [id number] Patient Account Number: [id number] Date of Birth/Sex: 06/24/1961 (57 y.o. Male) Treating RN: Montey Hora Primary Care Provider: Lorelee Market Other Clinician: Referring Provider: Lisa Roca Treating Provider/Extender: Melburn Hake, Lindsy Cerullo Weeks in Treatment: 0 Label Progress Note Print Version as History and Physical for this encounter Information Obtained From Patient Wound History Do you currently have one or more open woundso Yes How many open wounds do you  currently haveo 2 Approximately how long have you had your woundso 1 week How have you been treating your wound(s) until nowo peroxide Has your wound(s) ever healed and then re-openedo No Have you had any lab work done in the past montho No Have you tested positive for an antibiotic resistant organism (MRSA, VRE)o Yes Have you tested positive for osteomyelitis (bone infection)o No Have you had any tests for circulation on your legso No Eyes Complaints and Symptoms: No Complaints or Symptoms Medical History: Negative for: Cataracts; Glaucoma; Optic Neuritis Ear/Nose/Mouth/Throat Complaints and Symptoms: No Complaints or Symptoms Medical History: Negative for: Chronic sinus problems/congestion; Middle ear problems Hematologic/Lymphatic Complaints and Symptoms: No Complaints or Symptoms Medical History: Negative for: Anemia; Hemophilia; Human Immunodeficiency Virus; Lymphedema; Sickle Cell Disease Respiratory Complaints and Symptoms: No Complaints or Symptoms Medical History: Negative for: Aspiration; Asthma; Chronic Obstructive Pulmonary Disease (COPD); Pneumothorax; Sleep Apnea; Tuberculosis Cardiovascular MATEEN, FRANSSEN ([id number]) Complaints and Symptoms: No Complaints or Symptoms Medical History: Positive for: Hypertension Negative for: Angina; Arrhythmia; Congestive Heart Failure; Coronary Artery Disease; Deep Vein Thrombosis; Hypotension; Myocardial Infarction; Peripheral Arterial Disease; Peripheral Venous Disease; Phlebitis; Vasculitis Gastrointestinal Complaints and Symptoms: No Complaints or Symptoms Medical History: Negative for: Cirrhosis ; Colitis; Crohnos; Hepatitis A; Hepatitis B; Hepatitis C Endocrine Complaints and Symptoms: No Complaints or Symptoms Medical History: Positive for: Type II Diabetes Negative for: Type I Diabetes Time with diabetes: 20 yrs Treated with: Insulin Blood sugar tested every day: Yes Tested : 3 times  weekly Genitourinary Complaints and Symptoms: No Complaints or Symptoms Medical History: Negative for: End Stage Renal Disease Immunological Complaints and Symptoms: No Complaints or Symptoms Medical History: Negative for: Lupus Erythematosus; Raynaudos; Scleroderma Integumentary (Skin) Complaints and Symptoms: No Complaints or Symptoms Medical History: Negative for: History of Burn; History of pressure wounds Musculoskeletal Complaints and Symptoms: No Complaints or Symptoms Medical History: ZACKERIE, SARA ([id number]) Negative for: Gout; Rheumatoid Arthritis; Osteoarthritis; Osteomyelitis Neurologic Complaints and Symptoms: No Complaints or Symptoms Medical History: Positive for: Neuropathy Negative for: Dementia; Quadriplegia; Paraplegia; Seizure Disorder Oncologic Complaints and Symptoms: No Complaints or Symptoms Medical History: Negative for: Received Chemotherapy; Received Radiation Psychiatric Complaints and Symptoms: No Complaints or Symptoms Medical History: Negative for: Anorexia/bulimia; Confinement Anxiety Immunizations Pneumococcal Vaccine: Received Pneumococcal Vaccination: No Immunization Notes: up to date Implantable Devices Family and Social History Cancer: No; Diabetes: Yes - Mother,Father; Heart Disease: Yes - Father,Mother; Hereditary Spherocytosis: No; Hypertension: Yes - Mother,Father; Kidney Disease: No; Lung Disease: No; Seizures: No; Stroke: Yes - Father; Thyroid Problems: No; Tuberculosis: No; Never smoker; Marital Status - Married; Alcohol Use: Never; Drug Use: No History; Caffeine Use: Daily; Financial Concerns: No; Food, Clothing or Shelter Needs: No; Support System Lacking: No; Transportation Concerns: No; Advanced Directives: No; Patient does not want information on Advanced Directives; Do not resuscitate:  No; Living Will: No; Medical Power of Attorney: No Electronic Signature(s) Signed: 09/02/2017 7:21:18 PM By: Worthy Keeler  PA-C Signed: 09/03/2017 4:38:42 PM By: Montey Hora Entered By: Montey Hora on 09/02/2017 08:13:45 [name] ([id number]) -------------------------------------------------------------------------------- [place] Details Patient Name: [name]. Date of Service: 09/02/2017 Medical Record Number: [id number] Patient Account Number: [id number] Date of Birth/Sex: 11/18/1960 (57 y.o. Male) Treating RN: Ahmed Prima Primary Care Provider: Lorelee Market Other Clinician: Referring Provider: Lisa Roca Treating Provider/Extender: Melburn Hake, Jenniger Figiel Weeks in Treatment: 0 Diagnosis Coding ICD-10 Codes Code Description E11.621 Type 2 diabetes mellitus with foot ulcer L97.512 Non-pressure chronic ulcer of other part of right foot with fat layer exposed L97.528 Non-pressure chronic ulcer of other part of left foot with other specified severity Facility Procedures CPT4 Code Description: 59470761 99213 - WOUND CARE VISIT-LEV 3 EST PT Modifier: Quantity: 1 CPT4 Code Description: [id number] 97597 - DEBRIDE WOUND 1ST 20 SQ CM OR < ICD-10 Diagnosis Description L97.528 Non-pressure chronic ulcer of other part of left foot with other s Modifier: pecified sever Quantity: 1 ity Physician Procedures CPT4 Code Description: 5789784 99214 - WC PHYS LEVEL 4 - EST PT ICD-10 Diagnosis Description E11.621 Type 2 diabetes mellitus with foot ulcer L97.512 Non-pressure chronic ulcer of other part of right foot with fat la L97.528 Non-pressure chronic ulcer of  other part of left foot with other s Modifier: 25 yer exposed pecified severi Quantity: 1 ty CPT4 Code Description: [id number] 08138 - WC PHYS DEBR WO ANESTH 20 SQ CM ICD-10 Diagnosis Description L97.528 Non-pressure chronic ulcer of other part of left foot with other s Modifier: pecified severi Quantity: 1 ty Electronic Signature(s) Signed: 09/02/2017 7:21:18 PM By: Worthy Keeler PA-C Entered By: Worthy Keeler on 09/02/2017 16:45:10

## 2017-09-04 NOTE — Progress Notes (Signed)
AYSON, CHERUBINI ([id number]) Visit Report for 09/02/2017 Abuse/Suicide Risk Screen Details Patient Name: [name], [name]. Date of Service: 09/02/2017 8:00 AM Medical Record Number: [id number] Patient Account Number: [id number] Date of Birth/Sex: 07-Oct-1960 (57 y.o. Male) Treating RN: Montey Hora Primary Care Rexanne Inocencio: Lorelee Market Other Clinician: Referring Josuha Fontanez: Lisa Roca Treating Zalman Hull/Extender: Melburn Hake, HOYT Weeks in Treatment: 0 Abuse/Suicide Risk Screen Items Answer ABUSE/SUICIDE RISK SCREEN: Has anyone close to you tried to hurt or harm you recentlyo No Do you feel uncomfortable with anyone in your familyo No Has anyone forced you do things that you didnot want to doo No Do you have any thoughts of harming yourselfo No Patient displays signs or symptoms of abuse and/or neglect. No Electronic Signature(s) Signed: 09/03/2017 4:38:42 PM By: Montey Hora Entered By: Montey Hora on 09/02/2017 08:13:54 [name] ([id number]) -------------------------------------------------------------------------------- Activities of Daily Living Details Patient Name: [name]. Date of Service: 09/02/2017 8:00 AM Medical Record Number: [id number] Patient Account Number: [id number] Date of Birth/Sex: 1960/10/24 (57 y.o. Male) Treating RN: Montey Hora Primary Care Loni Abdon: Lorelee Market Other Clinician: Referring Pius Byrom: Lisa Roca Treating Jeffrey Graefe/Extender: Melburn Hake, HOYT Weeks in Treatment: 0 Activities of Daily Living Items Answer Activities of Daily Living (Please select one for each item) Drive Automobile Completely Able Take Medications Completely Able Use Telephone Completely Able Care for Appearance Completely Able Use Toilet Completely Able Bath / Shower Completely Able Dress Self Completely Able Feed Self Completely Able Walk Completely Able Get In / Out Bed Completely Able Housework Completely Able Prepare Meals Completely  Able Handle Money Completely Able Shop for Self Completely Able Electronic Signature(s) Signed: 09/03/2017 4:38:42 PM By: Montey Hora Entered By: Montey Hora on 09/02/2017 08:14:11 [name] ([id number]) -------------------------------------------------------------------------------- Education Assessment Details Patient Name: [name]. Date of Service: 09/02/2017 8:00 AM Medical Record Number: [id number] Patient Account Number: [id number] Date of Birth/Sex: Jul 22, 1960 (57 y.o. Male) Treating RN: Montey Hora Primary Care Breylin Dom: Lorelee Market Other Clinician: Referring Honore Wipperfurth: Lisa Roca Treating Evaline Waltman/Extender: Worthy Keeler Weeks in Treatment: 0 Primary Learner Assessed: Patient Learning Preferences/Education Level/Primary Language Learning Preference: Explanation, Demonstration Highest Education Level: College or Above Preferred Language: English Cognitive Barrier Assessment/Beliefs Language Barrier: No Translator Needed: No Memory Deficit: No Emotional Barrier: No Cultural/Religious Beliefs Affecting Medical Care: No Physical Barrier Assessment Impaired Vision: No Impaired Hearing: No Decreased Hand dexterity: No Knowledge/Comprehension Assessment Knowledge Level: Medium Comprehension Level: Medium Ability to understand written Medium instructions: Ability to understand verbal Medium instructions: Motivation Assessment Anxiety Level: Calm Cooperation: Cooperative Education Importance: Acknowledges Need Interest in Health Problems: Asks Questions Perception: Coherent Willingness to Engage in Self- Medium Management Activities: Readiness to Engage in Self- Medium Management Activities: Electronic Signature(s) Signed: 09/03/2017 4:38:42 PM By: Montey Hora Entered By: Montey Hora on 09/02/2017 08:14:29 [name] ([id number]) -------------------------------------------------------------------------------- Fall Risk  Assessment Details Patient Name: [name]. Date of Service: 09/02/2017 8:00 AM Medical Record Number: [id number] Patient Account Number: [id number] Date of Birth/Sex: Apr 05, 1961 (57 y.o. Male) Treating RN: Montey Hora Primary Care Jaqualyn Juday: Lorelee Market Other Clinician: Referring Joliyah Lippens: Lisa Roca Treating Abdulai Blaylock/Extender: Melburn Hake, HOYT Weeks in Treatment: 0 Fall Risk Assessment Items Have you had 2 or more falls in the last 12 monthso 0 No Have you had any fall that resulted in injury in the last 12 monthso 0 No FALL RISK ASSESSMENT: History of falling - immediate or within 3 months 0 No Secondary diagnosis 0 No Ambulatory aid None/bed rest/wheelchair/nurse 0 Yes Crutches/cane/walker 0 No Furniture  0 No IV Access/Saline Lock 0 No Gait/Training Normal/bed rest/immobile 0 Yes Weak 0 No Impaired 0 No Mental Status Oriented to own ability 0 Yes Electronic Signature(s) Signed: 09/03/2017 4:38:42 PM By: Montey Hora Entered By: Montey Hora on 09/02/2017 08:14:40 [name] ([id number]) -------------------------------------------------------------------------------- Foot Assessment Details Patient Name: [name]. Date of Service: 09/02/2017 8:00 AM Medical Record Number: [id number] Patient Account Number: [id number] Date of Birth/Sex: [birth year] (57 y.o. Male) Treating RN: Montey Hora Primary Care Tavarius Grewe: Lorelee Market Other Clinician: Referring Wiletta Bermingham: Lisa Roca Treating Lasandra Batley/Extender: Melburn Hake, HOYT Weeks in Treatment: 0 Foot Assessment Items Site Locations + = Sensation present, - = Sensation absent, C = Callus, U = Ulcer R = Redness, W = Warmth, M = Maceration, PU = Pre-ulcerative lesion F = Fissure, S = Swelling, D = Dryness Assessment Right: Left: Other Deformity: No No Prior Foot Ulcer: No No Prior Amputation: No No Charcot Joint: No No Ambulatory Status: Ambulatory Without Help Gait: Steady Electronic  Signature(s) Signed: 09/03/2017 4:38:42 PM By: Montey Hora Entered By: Montey Hora on 09/02/2017 08:25:31 [name] ([id number]) -------------------------------------------------------------------------------- Nutrition Risk Assessment Details Patient Name: [name]. Date of Service: 09/02/2017 8:00 AM Medical Record Number: [id number] Patient Account Number: [id number] Date of Birth/Sex: Jul 04, 1960 (57 y.o. Male) Treating RN: Montey Hora Primary Care Charlette Hennings: Lorelee Market Other Clinician: Referring Crystalee Ventress: Lisa Roca Treating Dracen Reigle/Extender: Melburn Hake, HOYT Weeks in Treatment: 0 Height (in): 70 Weight (lbs): 270 Body Mass Index (BMI): 38.7 Nutrition Risk Assessment Items NUTRITION RISK SCREEN: I have an illness or condition that made me change the kind and/or amount of 0 No food I eat I eat fewer than two meals per day 0 No I eat few fruits and vegetables, or milk products 0 No I have three or more drinks of beer, liquor or wine almost every day 0 No I have tooth or mouth problems that make it hard for me to eat 0 No I don't always have enough money to buy the food I need 0 No I eat alone most of the time 0 No I take three or more different prescribed or over-the-counter drugs a day 1 Yes Without wanting to, I have lost or gained 10 pounds in the last six months 0 No I am not always physically able to shop, cook and/or feed myself 0 No Nutrition Protocols Good Risk Protocol 0 No interventions needed Moderate Risk Protocol Electronic Signature(s) Signed: 09/03/2017 4:38:42 PM By: Montey Hora Entered By: Montey Hora on 09/02/2017 08:14:47

## 2017-09-05 NOTE — Progress Notes (Signed)
[name], [name] ([id number]) Visit Report for 09/02/2017 Allergy List Details Patient Name: [name], [name]. Date of Service: 09/02/2017 8:00 AM Medical Record Number: [id number] Patient Account Number: [id number] Date of Birth/Sex: [birth year] (57 y.o. Male) Treating RN: Montey Hora Primary Care Seydou Hearns: Lorelee Market Other Clinician: Referring Oza Oberle: Lisa Roca Treating Nilson Tabora/Extender: Melburn Hake, HOYT Weeks in Treatment: 0 Allergies Active Allergies Bactrim Allergy Notes Electronic Signature(s) Signed: 09/03/2017 4:38:42 PM By: Montey Hora Entered By: Montey Hora on 09/02/2017 08:11:29 [name] ([id number]) -------------------------------------------------------------------------------- Arrival Information Details Patient Name: [name], MEZERA. Date of Service: 09/02/2017 8:00 AM Medical Record Number: [id number] Patient Account Number: [id number] Date of Birth/Sex: Jan 30, 1961 (57 y.o. Male) Treating RN: Montey Hora Primary Care Leandre Wien: Lorelee Market Other Clinician: Referring Kanen Mottola: Lisa Roca Treating Darla Mcdonald/Extender: Melburn Hake, HOYT Weeks in Treatment: 0 Visit Information Patient Arrived: Ambulatory Arrival Time: 08:09 Accompanied By: self Transfer Assistance: None Patient Identification Verified: Yes Secondary Verification Process Completed: Yes Patient Has Alerts: Yes Patient Alerts: DMII History Since Last Visit Added or deleted any medications: No Any new allergies or adverse reactions: No Had a fall or experienced change in activities of daily living that may affect risk of falls: No Signs or symptoms of abuse/neglect since last visito No Hospitalized since last visit: No Has Dressing in Place as Prescribed: Yes Electronic Signature(s) Signed: 09/03/2017 4:38:42 PM By: Montey Hora Entered By: Montey Hora on 09/02/2017 08:10:21 [name]  ([id number]) -------------------------------------------------------------------------------- Clinic Level of Care Assessment Details Patient Name: [name]. Date of Service: 09/02/2017 8:00 AM Medical Record Number: [id number] Patient Account Number: [id number] Date of Birth/Sex: 1961/05/02 (58 y.o. Male) Treating RN: Ahmed Prima Primary Care Doreen Garretson: Lorelee Market Other Clinician: Referring D'Arcy Abraha: Lisa Roca Treating Quin Mathenia/Extender: Melburn Hake, HOYT Weeks in Treatment: 0 Clinic Level of Care Assessment Items TOOL 1 Quantity Score X - Use when EandM and Procedure is performed on INITIAL visit 1 0 ASSESSMENTS - Nursing Assessment / Reassessment X - General Physical Exam (combine w/ comprehensive assessment (listed just below) when 1 20 performed on new pt. evals) X- 1 25 Comprehensive Assessment (HX, ROS, Risk Assessments, Wounds Hx, etc.) ASSESSMENTS - Wound and Skin Assessment / Reassessment []  - Dermatologic / Skin Assessment (not related to wound area) 0 ASSESSMENTS - Ostomy and/or Continence Assessment and Care []  - Incontinence Assessment and Management 0 []  - 0 Ostomy Care Assessment and Management (repouching, etc.) PROCESS - Coordination of Care X - Simple Patient / Family Education for ongoing care 1 15 []  - 0 Complex (extensive) Patient / Family Education for ongoing care []  - 0 Staff obtains Programmer, systems, Records, Test Results / Process Orders []  - 0 Staff telephones HHA, Nursing Homes / Clarify orders / etc []  - 0 Routine Transfer to another Facility (non-emergent condition) []  - 0 Routine Hospital Admission (non-emergent condition) X- 1 15 New Admissions / Biomedical engineer / Ordering NPWT, Apligraf, etc. []  - 0 Emergency Hospital Admission (emergent condition) PROCESS - Special Needs []  - Pediatric / Minor Patient Management 0 []  - 0 Isolation Patient Management []  - 0 Hearing / Language / Visual special needs []  - 0 Assessment  of Community assistance (transportation, D/C planning, etc.) []  - 0 Additional assistance / Altered mentation []  - 0 Support Surface(s) Assessment (bed, cushion, seat, etc.) [name], [name]. ([id number]) INTERVENTIONS - Miscellaneous []  - External ear exam 0 []  - 0 Patient Transfer (multiple staff / Civil Service fast streamer / Similar devices) []  - 0 Simple Staple / Suture removal (25 or less) []  -  0 Complex Staple / Suture removal (26 or more) []  - 0 Hypo/Hyperglycemic Management (do not check if billed separately) X- 1 15 Ankle / Brachial Index (ABI) - do not check if billed separately Has the patient been seen at the hospital within the last three years: Yes Total Score: 90 Level Of Care: New/Established - Level 3 Electronic Signature(s) Signed: 09/03/2017 4:15:20 PM By: Alric Quan Entered By: Alric Quan on 09/02/2017 12:34:39 [name] ([id number]) -------------------------------------------------------------------------------- Encounter Discharge Information Details Patient Name: [name], SCOUTEN. Date of Service: 09/02/2017 8:00 AM Medical Record Number: [id number] Patient Account Number: [id number] Date of Birth/Sex: 1961/05/06 (57 y.o. Male) Treating RN: Roger Shelter Primary Care Niah Heinle: Lorelee Market Other Clinician: Referring Clarity Ciszek: Lisa Roca Treating Terriann Difonzo/Extender: Melburn Hake, HOYT Weeks in Treatment: 0 Encounter Discharge Information Items Discharge Pain Level: 0 Discharge Condition: Stable Ambulatory Status: Ambulatory Discharge Destination: Home Transportation: Private Auto Accompanied By: self Schedule Follow-up Appointment: Yes Medication Reconciliation completed and No provided to Patient/Care Roma Bierlein: Provided on Clinical Summary of Care: 09/02/2017 Form Type Recipient Paper Patient HP Electronic Signature(s) Signed: 09/04/2017 11:42:25 AM By: Ruthine Dose Entered By: Ruthine Dose on 09/02/2017 09:16:43 [name]  ([id number]) -------------------------------------------------------------------------------- Lower Extremity Assessment Details Patient Name: [name]. Date of Service: 09/02/2017 8:00 AM Medical Record Number: [id number] Patient Account Number: [id number] Date of Birth/Sex: 14-Feb-1961 (57 y.o. Male) Treating RN: Montey Hora Primary Care Ty Oshima: Lorelee Market Other Clinician: Referring Raenell Mensing: Lisa Roca Treating Terran Klinke/Extender: Melburn Hake, HOYT Weeks in Treatment: 0 Edema Assessment Assessed: [Left: No] [Right: No] Edema: [Left: No] [Right: No] Vascular Assessment Pulses: Dorsalis Pedis Palpable: [Left:Yes] [Right:Yes] Doppler Audible: [Left:Yes] [Right:Yes] Posterior Tibial Palpable: [Left:Yes] [Right:Yes] Doppler Audible: [Left:Yes] [Right:Yes] Extremity colors, hair growth, and conditions: Extremity Color: [Left:Hyperpigmented] [Right:Hyperpigmented] Hair Growth on Extremity: [Left:No] [Right:No] Temperature of Extremity: [Left:Warm] [Right:Warm] Capillary Refill: [Left:< 3 seconds] [Right:< 3 seconds] Blood Pressure: Brachial: [Left:156] [Right:160] Dorsalis Pedis: 186 [Left:Dorsalis Pedis: 184] Ankle: Posterior Tibial: 180 [Left:Posterior Tibial: 182 1.16] [Right:1.15] Toe Nail Assessment Left: Right: Thick: Yes Yes Discolored: Yes Yes Deformed: Yes Yes Improper Length and Hygiene: No No Electronic Signature(s) Signed: 09/03/2017 4:38:42 PM By: Montey Hora Entered By: Montey Hora on 09/02/2017 08:32:29 [name] ([id number]) -------------------------------------------------------------------------------- Multi Wound Chart Details Patient Name: [name]. Date of Service: 09/02/2017 8:00 AM Medical Record Number: [id number] Patient Account Number: [id number] Date of Birth/Sex: 1960/12/27 (57 y.o. Male) Treating RN: Ahmed Prima Primary Care Sharnetta Gielow: Lorelee Market Other Clinician: Referring Elden Brucato: Lisa Roca Treating Kabrina Christiano/Extender: Melburn Hake, HOYT Weeks in Treatment: 0 Vital Signs Height(in): 70 Pulse(bpm): 23 Weight(lbs): 270 Blood Pressure(mmHg): 152/84 Body Mass Index(BMI): 39 Temperature(F): 98.2 Respiratory Rate 18 (breaths/min): Photos: [N/A:N/A] Wound Location: Left Foot - Lateral Right Toe Second N/A Wounding Event: Gradually Appeared Gradually Appeared N/A Primary Etiology: Diabetic Wound/Ulcer of the Diabetic Wound/Ulcer of the N/A Lower Extremity Lower Extremity Comorbid History: Hypertension, Type II Hypertension, Type II N/A Diabetes, Neuropathy Diabetes, Neuropathy Date Acquired: 08/25/2017 08/25/2017 N/A Weeks of Treatment: 0 0 N/A Wound Status: Open Open N/A Pending Amputation on Yes Yes N/A Presentation: Measurements L x W x D 0.1x0.1x0.1 0.2x0.2x0.1 N/A (cm) Area (cm) : 0.008 0.031 N/A Volume (cm) : 0.001 0.003 N/A Classification: Unable to visualize wound bed Grade 1 N/A Exudate Amount: None Present Medium N/A Exudate Type: N/A Serous N/A Exudate Color: N/A amber N/A Wound Margin: Flat and Intact Flat and Intact N/A Granulation Amount: None Present (0%) Large (67-100%) N/A Granulation Quality: N/A Red N/A Necrotic Amount: None  Present (0%) None Present (0%) N/A Exposed Structures: Fascia: No Fascia: No N/A Fat Layer (Subcutaneous Fat Layer (Subcutaneous Tissue) Exposed: No Tissue) Exposed: No Tendon: No Tendon: No Muscle: No Muscle: No [name], BUFFALO. ([id number]) Joint: No Joint: No Bone: No Bone: No Epithelialization: None None N/A Periwound Skin Texture: Callus: Yes Callus: Yes N/A Excoriation: No Excoriation: No Induration: No Induration: No Crepitus: No Crepitus: No Rash: No Rash: No Scarring: No Scarring: No Periwound Skin Moisture: Maceration: No Maceration: No N/A Dry/Scaly: No Dry/Scaly: No Periwound Skin Color: Atrophie Blanche: No Atrophie Blanche: No N/A Cyanosis: No Cyanosis: No Ecchymosis:  No Ecchymosis: No Erythema: No Erythema: No Hemosiderin Staining: No Hemosiderin Staining: No Mottled: No Mottled: No Pallor: No Pallor: No Rubor: No Rubor: No Temperature: No Abnormality No Abnormality N/A Tenderness on Palpation: Yes Yes N/A Wound Preparation: Ulcer Cleansing: Ulcer Cleansing: N/A Rinsed/Irrigated with Saline Rinsed/Irrigated with Saline Topical Anesthetic Applied: Topical Anesthetic Applied: Other: lidocaine 4% Other: lidocaine 4% Treatment Notes Electronic Signature(s) Signed: 09/03/2017 4:15:20 PM By: Alric Quan Entered By: Alric Quan on 09/02/2017 08:52:10 [name] ([id number]) -------------------------------------------------------------------------------- [place] Details Patient Name: [name], DAWES. Date of Service: 09/02/2017 8:00 AM Medical Record Number: [id number] Patient Account Number: [id number] Date of Birth/Sex: 1961-06-30 (57 y.o. Male) Treating RN: Ahmed Prima Primary Care Malkia Nippert: Lorelee Market Other Clinician: Referring Rehanna Oloughlin: Lisa Roca Treating Ashaz Robling/Extender: Melburn Hake, HOYT Weeks in Treatment: 0 Active Inactive ` Orientation to the Wound Care Program Nursing Diagnoses: Knowledge deficit related to the wound healing center program Goals: Patient/caregiver will verbalize understanding of the [place] Program Date Initiated: 09/02/2017 Target Resolution Date: 10/04/2017 Goal Status: Active Interventions: Provide education on orientation to the wound center Notes: ` Pain, Acute or Chronic Nursing Diagnoses: Pain, acute or chronic: actual or potential Potential alteration in comfort, pain Goals: Patient/caregiver will verbalize adequate pain control between visits Date Initiated: 09/02/2017 Target Resolution Date: 01/03/2018 Goal Status: Active Interventions: Complete pain assessment as per visit requirements Notes: ` Wound/Skin Impairment Nursing  Diagnoses: Impaired tissue integrity Knowledge deficit related to ulceration/compromised skin integrity Goals: Ulcer/skin breakdown will have a volume reduction of 80% by week 12 Date Initiated: 09/02/2017 Target Resolution Date: 12/27/2017 Goal Status: Active [name], [name] ([id number]) Interventions: Assess patient/caregiver ability to perform ulcer/skin care regimen upon admission and as needed Assess ulceration(s) every visit Notes: Electronic Signature(s) Signed: 09/03/2017 4:15:20 PM By: Alric Quan Entered By: Alric Quan on 09/02/2017 08:51:20 [name] ([id number]) -------------------------------------------------------------------------------- Pain Assessment Details Patient Name: [name]. Date of Service: 09/02/2017 8:00 AM Medical Record Number: [id number] Patient Account Number: [id number] Date of Birth/Sex: September 05, 1960 (57 y.o. Male) Treating RN: Montey Hora Primary Care Brandie Lopes: Lorelee Market Other Clinician: Referring Ceejay Kegley: Lisa Roca Treating Tavio Biegel/Extender: Melburn Hake, HOYT Weeks in Treatment: 0 Active Problems Location of Pain Severity and Description of Pain Patient Has Paino No Site Locations Pain Management and Medication Current Pain Management: Notes Topical or injectable lidocaine is offered to patient for acute pain when surgical debridement is performed. If needed, Patient is instructed to use over the counter pain medication for the following 24-48 hours after debridement. Wound care MDs do not prescribed pain medications. Patient has chronic pain or uncontrolled pain. Patient has been instructed to make an appointment with their Primary Care Physician for pain management. Electronic Signature(s) Signed: 09/03/2017 4:38:42 PM By: Montey Hora Entered By: Montey Hora on 09/02/2017 08:10:30 [name]  ([id number]) -------------------------------------------------------------------------------- Patient/Caregiver Education Details Patient Name: [name]. Date of  Service: 09/02/2017 8:00 AM Medical Record Number: [id number] Patient Account Number: [id number] Date of Birth/Gender: 08/14/1960 (57 y.o. Male) Treating RN: Roger Shelter Primary Care Physician: Lorelee Market Other Clinician: Referring Physician: Lisa Roca Treating Physician/Extender: Sharalyn Ink in Treatment: 0 Education Assessment Education Provided To: Patient Education Topics Provided Wound Debridement: Handouts: Wound Debridement Methods: Explain/Verbal Responses: State content correctly Wound/Skin Impairment: Handouts: Caring for Your Ulcer Methods: Explain/Verbal Responses: State content correctly Electronic Signature(s) Signed: 09/03/2017 4:15:18 PM By: Roger Shelter Entered By: Roger Shelter on 09/02/2017 09:14:34 [name] ([id number]) -------------------------------------------------------------------------------- Wound Assessment Details Patient Name: [name]. Date of Service: 09/02/2017 8:00 AM Medical Record Number: [id number] Patient Account Number: [id number] Date of Birth/Sex: 07-Feb-1961 (57 y.o. Male) Treating RN: Montey Hora Primary Care Ishia Tenorio: Lorelee Market Other Clinician: Referring Agastya Meister: Lisa Roca Treating Winter Jocelyn/Extender: Melburn Hake, HOYT Weeks in Treatment: 0 Wound Status Wound Number: 10 Primary Etiology: Diabetic Wound/Ulcer of the Lower Extremity Wound Location: Left Foot - Lateral Wound Status: Open Wounding Event: Gradually Appeared Comorbid Hypertension, Type II Diabetes, Date Acquired: 08/25/2017 History: Neuropathy Weeks Of Treatment: 0 Clustered Wound: No Pending Amputation On Presentation Photos Photo Uploaded By: Montey Hora on 09/02/2017 08:51:53 Wound Measurements Length: (cm) 0.1 Width: (cm) 0.1 Depth:  (cm) 0.1 Area: (cm) 0.008 Volume: (cm) 0.001 % Reduction in Area: % Reduction in Volume: Epithelialization: None Tunneling: No Undermining: No Wound Description Classification: Unable to visualize wound bed Wound Margin: Flat and Intact Exudate Amount: None Present Foul Odor After Cleansing: No Slough/Fibrino No Wound Bed Granulation Amount: None Present (0%) Exposed Structure Necrotic Amount: None Present (0%) Fascia Exposed: No Fat Layer (Subcutaneous Tissue) Exposed: No Tendon Exposed: No Muscle Exposed: No Joint Exposed: No Bone Exposed: No Periwound Skin Texture Texture Color [name], GUGLIELMO. ([id number]) No Abnormalities Noted: No No Abnormalities Noted: No Callus: Yes Atrophie Blanche: No Crepitus: No Cyanosis: No Excoriation: No Ecchymosis: No Induration: No Erythema: No Rash: No Hemosiderin Staining: No Scarring: No Mottled: No Pallor: No Moisture Rubor: No No Abnormalities Noted: No Dry / Scaly: No Temperature / Pain Maceration: No Temperature: No Abnormality Tenderness on Palpation: Yes Wound Preparation Ulcer Cleansing: Rinsed/Irrigated with Saline Topical Anesthetic Applied: Other: lidocaine 4%, Treatment Notes Wound #10 (Left, Lateral Foot) 1. Cleansed with: Clean wound with Normal Saline 2. Anesthetic Topical Lidocaine 4% cream to wound bed prior to debridement 3. Peri-wound Care: Skin Prep 5. Secondary Dressing Applied Bordered Foam Dressing Electronic Signature(s) Signed: 09/03/2017 4:38:42 PM By: Montey Hora Entered By: Montey Hora on 09/02/2017 08:22:32 [name] ([id number]) -------------------------------------------------------------------------------- Wound Assessment Details Patient Name: [name], TANZI. Date of Service: 09/02/2017 8:00 AM Medical Record Number: [id number] Patient Account Number: [id number] Date of Birth/Sex: August 02, 1960 (57 y.o. Male) Treating RN: Montey Hora Primary Care Jalani Cullifer:  Lorelee Market Other Clinician: Referring Seairra Otani: Lisa Roca Treating Landin Tallon/Extender: Melburn Hake, HOYT Weeks in Treatment: 0 Wound Status Wound Number: 11 Primary Etiology: Diabetic Wound/Ulcer of the Lower Extremity Wound Location: Right Toe Second Wound Status: Open Wounding Event: Gradually Appeared Comorbid Hypertension, Type II Diabetes, Date Acquired: 08/25/2017 History: Neuropathy Weeks Of Treatment: 0 Clustered Wound: No Pending Amputation On Presentation Photos Photo Uploaded By: Montey Hora on 09/02/2017 08:51:54 Wound Measurements Length: (cm) 0.2 Width: (cm) 0.2 Depth: (cm) 0.1 Area: (cm) 0.031 Volume: (cm) 0.003 % Reduction in Area: % Reduction in Volume: Epithelialization: None Tunneling: No Undermining: No Wound Description Classification: Grade 1 Wound Margin: Flat and Intact Exudate Amount: Medium Exudate Type: Serous Exudate Color: amber Foul Odor After  Cleansing: No Slough/Fibrino No Wound Bed Granulation Amount: Large (67-100%) Exposed Structure Granulation Quality: Red Fascia Exposed: No Necrotic Amount: None Present (0%) Fat Layer (Subcutaneous Tissue) Exposed: No Tendon Exposed: No Muscle Exposed: No Joint Exposed: No Bone Exposed: No Periwound Skin Texture [name], TRULOCK. ([id number]) Texture Color No Abnormalities Noted: No No Abnormalities Noted: No Callus: Yes Atrophie Blanche: No Crepitus: No Cyanosis: No Excoriation: No Ecchymosis: No Induration: No Erythema: No Rash: No Hemosiderin Staining: No Scarring: No Mottled: No Pallor: No Moisture Rubor: No No Abnormalities Noted: No Dry / Scaly: No Temperature / Pain Maceration: No Temperature: No Abnormality Tenderness on Palpation: Yes Wound Preparation Ulcer Cleansing: Rinsed/Irrigated with Saline Topical Anesthetic Applied: Other: lidocaine 4%, Treatment Notes Wound #11 (Right Toe Second) 1. Cleansed with: Clean wound with Normal Saline 2.  Anesthetic Topical Lidocaine 4% cream to wound bed prior to debridement 4. Dressing Applied: Xeroform 5. Secondary Dressing Applied Dry Gauze Notes stretch net Electronic Signature(s) Signed: 09/03/2017 4:38:42 PM By: Montey Hora Entered By: Montey Hora on 09/02/2017 08:24:11 [name] ([id number]) -------------------------------------------------------------------------------- [place] Details Patient Name: [name]. Date of Service: 09/02/2017 8:00 AM Medical Record Number: [id number] Patient Account Number: [id number] Date of Birth/Sex: [birth year] (57 y.o. Male) Treating RN: Montey Hora Primary Care Marilee Ditommaso: Lorelee Market Other Clinician: Referring Sianni Cloninger: Lisa Roca Treating Naba Sneed/Extender: Melburn Hake, HOYT Weeks in Treatment: 0 Vital Signs Time Taken: 08:10 Temperature (F): 98.2 Height (in): 70 Pulse (bpm): 92 Source: Measured Respiratory Rate (breaths/min): 18 Weight (lbs): 270 Blood Pressure (mmHg): 152/84 Source: Measured Reference Range: 80 - 120 mg / dl Body Mass Index (BMI): 38.7 Electronic Signature(s) Signed: 09/03/2017 4:38:42 PM By: Montey Hora Entered By: Montey Hora on 09/02/2017 12:75:17

## 2017-09-09 ENCOUNTER — Ambulatory Visit: Payer: Commercial Managed Care - PPO | Admitting: Physician Assistant

## 2017-09-23 ENCOUNTER — Encounter: Payer: Commercial Managed Care - PPO | Admitting: Physician Assistant

## 2017-09-25 NOTE — Progress Notes (Signed)
DERON, POOLE ([id number]) Visit Report for 09/23/2017 Chief Complaint Document Details Patient Name: DEYTON, ELLENBECKER. Date of Service: 09/23/2017 2:45 PM Medical Record Number: [id number] Patient Account Number: [id number] Date of Birth/Sex: [birth year] (57 y.o. M) Treating RN: Ahmed Prima Primary Care Provider: Lorelee Market Other Clinician: Referring Provider: Lorelee Market Treating Provider/Extender: Melburn Hake, HOYT Weeks in Treatment: 3 Information Obtained from: Patient Chief Complaint Left foot cellulitis and right 2nd toe ulcer Electronic Signature(s) Signed: 09/24/2017 12:10:29 AM By: Worthy Keeler PA-C Entered By: Worthy Keeler on 09/23/2017 15:01:35 [name] ([id number]) -------------------------------------------------------------------------------- HPI Details Patient Name: [name]. Date of Service: 09/23/2017 2:45 PM Medical Record Number: [id number] Patient Account Number: [id number] Date of Birth/Sex: Oct 08, 1960 (57 y.o. M) Treating RN: Ahmed Prima Primary Care Provider: Lorelee Market Other Clinician: Referring Provider: Lorelee Market Treating Provider/Extender: Melburn Hake, HOYT Weeks in Treatment: 3 History of Present Illness HPI Description: Pleasant 57 year old with history of diabetes (Hgb A1c 10.8 in 2014) and peripheral neuropathy. No PVD. L ABI 1.1. Status post right great toe partial amputation years ago. He was at work and on 10/22/2014, was injured by a cart, and suffered an ulceration to his left anterior calf. He says that it subsequently became infected, and he was treated with a course of antibiotics. He was found on initial exam to have an ulceration on the dorsum of his left third toe. He was unaware of this and attributes it to pressure from his steel toed boots. More recently he injured his right anterior calf on a cart. Ambulating normally per his baseline. He has been undergoing regular debridements,  applying mupirocin cream, and an Ace wrap for edema control. He returns to clinic for follow-up and is without complaints. No pain. No fever or chills. No drainage. 10/25/15; this is a 57 year old man who has type II diabetes with diabetic polyneuropathy. He tells me that he fractured his left fifth metatarsal in June 2016 when he presented with swelling. He does not recall a specific injury. His hemoglobin A1c was apparently too high at the time for consideration of surgery and he was put in some form of offloading. Ultimately he went to surgery in December with an allograft from his calcaneus to this site, plate and screws. He had an x-ray of the foot in March that showed concerns about nonunion. He tells me that in March he had to move and basically moved himself. He was on his foot a lot and then noticed some drainage from an open area. He has been following with his orthopedic surgeon Dr. Doran Durand. He has been applying a felt donut, dry dressing and using his heel healing sandal. 11/01/15; this is a patient I saw last week for the first time. He had a small open wound on the plantar aspect of his left foot at roughly the level of the base of his fifth metatarsal. He had a considerable degree of thickened skin around this wound on the plantar aspect which I thought was from chronic pressure on this area. He tells [place] that he had drainage over the course of the week. No systemic symptoms. 11/08/15; culture last week grew Citrobacter korseri. This should've been sensitive to the Augmentin I gave him. He has seen Dr. Doran Durand who did his initial surgery and according the patient the plan is to give this another month and then the hardware might need to come out of this. This seems like a reasonable plan. I will adjust his antibiotics to ciprofloxacin which  probably should continue for at least another 2 weeks. I gave him 10 days worth today 11/22/15 the patient has completed antibiotics. He has an  appointment with Dr. Doran Durand this Friday. There is improved dimensions around the wound on the left fifth metatarsal base 11/29/15; the patient has completed antibiotics last week. Apparently his appointment with Dr. Doran Durand it is not until this Friday. Dimensions are roughly the same. 12/06/15; saw Dr. Doran Durand. No x-ray told the end of the month, next appointment June 30. We have been using Aquacel Ag 12/13/15: No major change this week. Using Aquacel AG 621/17; arrived this week with maceration around the wound. There was quite a bit of undermining which required surgical debridement. I changed him to [facility] last week, by the patient's admission he was up on this more this week 12/27/15; macerated tissue around the wound is removed with a scalpel and pickups. There is no undermining. Nonviable subcutaneous tissue and skin taken from the superior circumference of the wound is slough from the surface. READMISSION 03/06/16 since I last saw this patient at the end of June, he went for surgery on 01/11/16 by Dr. Doran Durand of orthopedics. He had a left foot irrigation and debridement, removal of hardware and placement of wound VAC. He is also been followed by Dr. Megan Salon of infectious disease and completed a six-week course of IV Rocephin for group B strep and Enterobacter in the bone at the time of surgery. Apparently at the time of surgery the bone looked healthy so I don't think any bone was actually removed. He has been using silver alginate based dressings on the same wound area at the base of the left fifth metatarsal on the left. I note that he is also had arterial studies on 01/08/16, these showed a left ABI of 1.2 to and a right ABI of 1.3. Waveforms were listed as biphasic. He was not felt to have any specific arterial issues. 03/13/16; no real change in the condition of the wound at the left lateral foot at roughly the base of his fifth metatarsal. Use silver alginate last week. DONDRELL, LOUDERMILK  ([id number]) 03/18/16 arrives today with no open area. Being suspicious of the overlying callus I. Some of this back although I see nothing but covering tissue here/epithelium. There is no surrounding tenderness READMISSION 04/16/16 this is a patient I discharged about a month ago. Initially a surgical wound on the lateral aspect of his left foot which subsequently became infected. The story he is giving today that he went back into his own modified shoe started to notice pain 2 weeks ago he was seen in Dr. Nona Dell office by a physician assistant last Wednesday and according the patient was told that everything looked fine however this is clearly now broken open and he has an open wound in the same spot that we have been dealing with repetitively. Situation is complicated by the fact that he is running short of money on long-term disability. He has not taken his insulin and at least 2 weeks was previously on NovoLog short acting insulin on a sliding scale and TRESIBA 35 units at bedtime. He is no longer able to afford any of his medication he was in the x-ray on 10/15. A plain x-ray showed healed fracture of the left fifth metatarsal bone status post removal of the associated plate and screw fixation hardware. There was no acute appearing osseous abnormality. His blood work showed a white count of 9.2 with an essentially normal differential comprehensive  metabolic panel was normal. Previous CT scan of the foot on 01/08/16 showed no osteomyelitis previous vascular workup showed no evidence of significant PAD on 01/13/16 04/23/16; culture I did last week grew enterococcus [ampicillin sensitive] and MRSA. He saw infectious disease yesterday. They stop the clindamycin and ordered an MRI. This is not unreasonable. All the hardware is out of the foot at this point. 04/30/16 at this point in time we are still awaiting the results of the MRI at this point in time. Patient did have an area which appears to be  somewhat macerated in the proximal portion of the wound where there is overlying necrotic skin that is doing nothing more than trapping fluid underneath. He continues to state that he does have some discomfort and again the concern is for the possibility of osteomyelitis hence the reason for the MRI order. We have been using a silver alginate dressing but again I think the reason this with his macerated as it was is that the dressing obscene could not reach the entirety of the wound due to some necrotic skin covering the proximal portion. No bleeding noted at this point in time on initial evaluation. 05/07/16 we receive the results of patient's MRI which shows that he has no evidence of osteomyelitis. This obviously is excellent news. Patient was definitely happy to hear this. He tells me the wound appears to be doing very well at this point in time and is pleased with the progress currently. 05/15/16; as noted the patient did not have osteomyelitis. He has been released by infectious disease and orthopedics. His wound is still open he had a debridement last week but complain when he got home he "bleeding for half a day". He is not had any pain. We have been using silver alginate with Kerlix gauze wrap. 05/28/16; patient arrives today with the wound in much the same condition as last time. He has a small opening on the lateral aspect of his foot however with debridement there is clear undermining medially there is no real evidence of infection here and I didn't see any point in culturing this. One would have to wonder if this isn't a simple matter of in adequate offloading as he is been using a healing sandal. 06/05/16; the open area is now on the plantar aspect of his foot and not decide. The wound almost appears to have "migrated". This was the term use by our intake nurse. 06/12/16; open area on the plantar aspect of his foot. Base of the wound looks very healthy. This will be his second week in  a total contact cast 06/19/16; patient arrived today in a total contact cast. There was some expectation from our staff and myself that this area would be healed. Unfortunately the area was boggy and with rec pressure a fairly substantial amount of purulent drainage was obtained. Specimen obtained for culture. The patient had no complaints of systemic problems including fever or chills or instability of his diabetes. There was no pain in the foot. Nevertheless a extensive debridement was required. 06/26/16; patient's culture from the abscess last week grew a combination of MRSA and ampicillin sensitive enterococcus. I had him on Augmentin and Septra however I have elected to give him a full 10 day course of Zyvox instead as I Recently treated this combination of organisms with Augmentin and Septra before. Arrives today with no systemic symptoms 07/03/16; the patient has 2 more treatments of Zyvox and then he is finished antibiotics the wound has improved now  mostly on the lateral aspect of his foot. There is still some tenderness when he walks. 07/10/16; patient arrives today with Zyvox completed. He only has a small open area remaining. 07/24/16; she arrives here with no overt open area. The covering is thick callus/eschar. Nevertheless there is no open area here. He has some tenderness underneath the area but no overt infection is observed no drainage. The patient has a deformity in the foot with this area weekly to be exposed to more pressure in his foot where nevertheless that something we are going to have to deal with going forward. The patient has diabetic workboots and diabetic shoes. He has had a long difficult course with the area here. This started as a fracture at work. He had bone grafting from his calcaneus and screws. This got infected he had to have more surgery on the area. Bone at the time of that surgery I think showed enterococcus and group B strep. He had 6 weeks of Rocephin. Since  then the area has waxed and waned in its difficulty. Recently he had an MRI in December that did not show osteomyelitis nevertheless he had an abscess that grew MRSA and enterococcus which I elected to treat with Zyvox. This was in December and this wound is actually "healed" over DUBLIN, GRAYER ([id number]) 08/21/16; the patient came in today for his one-month follow-up visit. The area on the lateral aspect of his left foot looks much the same as some month ago. There is no evidence of an open wound here. However the patient tells me about a week after he went back to work he developed severe pain and swelling in the plantar aspect of his right foot first and fifth metatarsal heads. 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 He went to see his podiatrist at [facility]. She told him that he could not work on his feet. She told him to go back and his cam walking boot on the left. Not have open wounds obviously on the right. The patient is actually gone ahead and retired from his job because he does not feel he can work on his feet 09/18/16; patient comes back in saying he recently had some pain on the lateral aspect of his left foot. Asked that we look at this. Other than that all of his wounds have a viable surface. He has diabetic shoes. He is retired from work. READMISSION Braydan Marriott is a 57 year old man who we cared for for a prolonged period of time in late 2017 into March 2018. At that point he had suffered a fracture of his left fifth metatarsal at about the level of the base of the fifth metatarsal. He had surgical repair by Dr. Meryl Dare and he developed a very refractory wound over the fifth metatarsal. Ultimately this became infected he required hardware removal this eventually closed over and we discharged from the clinic in March of this year. He tells me as well until roughly 3 weeks ago when he  developed increasing pain in the same area making it difficult for him to sleep. He was admitted to hospital from 9/5 through 9/7 with now an ulcer in the same area on the lateral aspect of his left foot at the level of the base of the metatarsal. X-rays and MRIs during this hospitalization were negative. Wound culture showed a combination of Escherichia coli, Morganella and Pseudomonas. He was given  IV Cipro and vancomycin the hospital and then discharged home on Cipro and Doxy. He returned to the ER on 9/16 with leg swelling and discomfort. I don't think anything was added at that point although he did have an ultrasound of the left leg that was negative for DVT. He was back in the ER on 10/4 again with pain in the area he was given Bactrim but states he had a significant allergic reaction to that which has abated once he stopped the Bactrim. I don't think he had a full course. He went to see his podiatrist yesterday who recommended some form of foot soak. He has a Darco forefoot offloading boot The patient is a type II diabetic on insulin. Tells me his recent hemoglobin A1c was 7. Arterial studies done in July 2017 showed an ABI in the right of 1.3 on the left 1.2 to biphasic waveforms bilaterally. He was not felt to have arterial disease. As noted the patient has had 2 MRIs most recently on 10/4 this showed no evidence of an abscess or osteomyelitis and no change since the prior study of 03/05/17 there was nonspecific edema on the dorsum of the foot. ABIs in this clinic today 1.2 which would be unchanged from his study done in July/17 04/15/17; now down to 1 small wound on the left lateral foot. We wrapped him and applied collagen last week and the foot is fairly macerated. 04/22/17; small wound on the left lateral foot however it has some undermining. Using collagen 04/29/17; small wound on the left lateral foot some surrounding callus. Chronic damage in this area as a result of initial fracture  nonhealing and secondary infection. 05/06/17; the patient arrives today with the area on the left lateral foot closed. There is thick subcutaneous tissue with a layer of callus. I took some of the callus off just to ensure adequate closure of the underlying tissue and there is no open wound here. He has considerable deformity of this area of his foot however and unless there is an ability to offload this I think opening is going to be recurrent. He tells me he has diabetic foot wear although I have not actually seen this Readmission: 09/02/17 on evaluation today patient appears to be doing a little better compared to what he tells me what's going on in regard to his left lateral foot. He was previously seen in the ER this past Saturday it is now Tuesday. On Saturday he actually was treated with antibiotics including Cipro 500 mg two times a day and doxycycline 100 mg two times a day. Subsequently he also did have an x-ray performed of his left foot which revealed increased degenerative changes of the base of the fifth metatarsal since comparison in 2018. There was no radiographic evidence of osteomyelitis however. Patient has previously had an MRI of the left foot which was performed on 04/03/17 in this revealed at that point a soft tissue ulceration but no evidence of osteomyelitis. Patient has previously undergone testing in regard to his ABI's by Dr. Bridgett Larsson and this showed that he did have ABI was within normal limits which does correspond with ABI's we checked here in the office today. That study was on 01/13/16. With all that being said on evaluation today there does not appear to be any evidence of a true open wound of the left lateral fifth metatarsal region. He does have tenderness noted as well is a little bit of boggy/fluctuance feeling to the area in this region of  his foot and there is definitely a very solid and firm callous noted laterally. With that being said I am not able to find any  obvious opening at this point there does appear to be a spot where there appears to been a opening very recently however patient states he has not noted any drainage from this currently. He does however have discomfort with palpation of this area this is in the 3-4/10 range only with very firm palpation this is not too significant at all. Subsequently he does have a small ulcer at the medial nail bed of the right second toe where he inadvertently pulled off a portion of the toenail softly causing this injury. Otherwise there does not appear to be any evidence of infection in regard to this right second toe. DAWON, TROOP ([id number]) 09/23/17 on evaluation today patient actually appears to be doing well he has no open ulcerations noted at this point. With that being said he did have significant callous noted over the left lateral foot which did require callous pairing today he tolerated this without complication. Electronic Signature(s) Signed: 09/24/2017 12:10:29 AM By: Worthy Keeler PA-C Entered By: Worthy Keeler on 09/23/2017 23:49:27 [name] ([id number]) -------------------------------------------------------------------------------- Callus Pairing Details Patient Name: JQUAN, EGELSTON. Date of Service: 09/23/2017 2:45 PM Medical Record Number: [id number] Patient Account Number: [id number] Date of Birth/Sex: [birth year] (57 y.o. M) Treating RN: Ahmed Prima Primary Care Provider: Lorelee Market Other Clinician: Referring Provider: Lorelee Market Treating Provider/Extender: Melburn Hake, HOYT Weeks in Treatment: 3 Procedure Performed for: Wound #11 Right Toe Second Performed By: Physician Emilio Math., PA-C Post Procedure Diagnosis Same as Pre-procedure Electronic Signature(s) Signed: 09/24/2017 4:37:47 PM By: Alric Quan Entered By: Alric Quan on 09/23/2017 15:45:59 [name]  ([id number]) -------------------------------------------------------------------------------- Callus Pairing Details Patient Name: [name]. Date of Service: 09/23/2017 2:45 PM Medical Record Number: [id number] Patient Account Number: [id number] Date of Birth/Sex: [birth year] (57 y.o. M) Treating RN: Ahmed Prima Primary Care Provider: Lorelee Market Other Clinician: Referring Provider: Lorelee Market Treating Provider/Extender: Melburn Hake, HOYT Weeks in Treatment: 3 Procedure Performed for: Wound #10 Left,Lateral Foot Performed By: Physician Emilio Math., PA-C Post Procedure Diagnosis Same as Pre-procedure Electronic Signature(s) Signed: 09/24/2017 4:37:47 PM By: Alric Quan Entered By: Alric Quan on 09/23/2017 15:50:15 [name] ([id number]) -------------------------------------------------------------------------------- Physical Exam Details Patient Name: KEYGAN, DUMOND. Date of Service: 09/23/2017 2:45 PM Medical Record Number: [id number] Patient Account Number: [id number] Date of Birth/Sex: [birth year] (57 y.o. M) Treating RN: Ahmed Prima Primary Care Provider: Lorelee Market Other Clinician: Referring Provider: Lorelee Market Treating Provider/Extender: Melburn Hake, HOYT Weeks in Treatment: 3 Constitutional Well-nourished and well-hydrated in no acute distress. Respiratory normal breathing without difficulty. Psychiatric this patient is able to make decisions and demonstrates good insight into disease process. Alert and Oriented x 3. pleasant and cooperative. Notes Again I did perform pairing of the callous overlying the left lateral foot which patient tolerated without any trouble whatsoever there was no bleeding and overall he states this did feel as well as look better post shaving. Obviously he still has a significant area firm callous but again there was no wound opening which is great news. Electronic  Signature(s) Signed: 09/24/2017 12:10:29 AM By: Worthy Keeler PA-C Entered By: Worthy Keeler on 09/23/2017 23:50:25 [name] ([id number]) -------------------------------------------------------------------------------- Physician Orders Details Patient Name: KEEVIN, PANEBIANCO. Date of Service: 09/23/2017 2:45 PM Medical Record Number: [id number] Patient Account Number: [id number] Date of Birth/Sex:  19-May-1961 (57 y.o. M) Treating RN: Ahmed Prima Primary Care Provider: Lorelee Market Other Clinician: Referring Provider: Lorelee Market Treating Provider/Extender: Melburn Hake, HOYT Weeks in Treatment: 3 Verbal / Phone Orders: Yes Clinician: Carolyne Fiscal, Debi Read Back and Verified: Yes Diagnosis Coding ICD-10 Coding Code Description E11.621 Type 2 diabetes mellitus with foot ulcer L97.512 Non-pressure chronic ulcer of other part of right foot with fat layer exposed L97.528 Non-pressure chronic ulcer of other part of left foot with other specified severity Discharge From [facility] o Discharge from [facility] - Please call our office if you have any questions or concerns. Electronic Signature(s) Signed: 09/24/2017 12:10:29 AM By: Worthy Keeler PA-C Signed: 09/24/2017 4:37:47 PM By: Alric Quan Entered By: Alric Quan on 09/23/2017 15:59:17 [name] ([id number]) -------------------------------------------------------------------------------- Problem List Details Patient Name: ELNATHAN, FULFORD. Date of Service: 09/23/2017 2:45 PM Medical Record Number: [id number] Patient Account Number: [id number] Date of Birth/Sex: 12/02/1960 (57 y.o. M) Treating RN: Ahmed Prima Primary Care Provider: Lorelee Market Other Clinician: Referring Provider: Lorelee Market Treating Provider/Extender: Melburn Hake, HOYT Weeks in Treatment: 3 Active Problems ICD-10 Impacting Encounter Code Description Active Date Wound Healing Diagnosis E11.621 Type 2  diabetes mellitus with foot ulcer 09/02/2017 Yes L97.512 Non-pressure chronic ulcer of other part of right foot with fat 09/02/2017 Yes layer exposed L97.528 Non-pressure chronic ulcer of other part of left foot with other 09/02/2017 Yes specified severity Inactive Problems Resolved Problems Electronic Signature(s) Signed: 09/24/2017 12:10:29 AM By: Worthy Keeler PA-C Entered By: Worthy Keeler on 09/23/2017 23:49:05 [name] ([id number]) -------------------------------------------------------------------------------- Progress Note/History and Physical Details Patient Name: [name]. Date of Service: 09/23/2017 2:45 PM Medical Record Number: [id number] Patient Account Number: [id number] Date of Birth/Sex: 08-Mar-1961 (57 y.o. M) Treating RN: Ahmed Prima Primary Care Provider: Lorelee Market Other Clinician: Referring Provider: Lorelee Market Treating Provider/Extender: Melburn Hake, HOYT Weeks in Treatment: 3 Subjective Chief Complaint Information obtained from Patient Left foot cellulitis and right 2nd toe ulcer History of Present Illness (HPI) Pleasant 57 year old with history of diabetes (Hgb A1c 10.8 in 2014) and peripheral neuropathy. No PVD. L ABI 1.1. Status post right great toe partial amputation years ago. He was at work and on 10/22/2014, was injured by a cart, and suffered an ulceration to his left anterior calf. He says that it subsequently became infected, and he was treated with a course of antibiotics. He was found on initial exam to have an ulceration on the dorsum of his left third toe. He was unaware of this and attributes it to pressure from his steel toed boots. More recently he injured his right anterior calf on a cart. Ambulating normally per his baseline. He has been undergoing regular debridements, applying mupirocin cream, and an Ace wrap for edema control. He returns to clinic for follow-up and is without complaints. No pain. No fever or  chills. No drainage. 10/25/15; this is a 57 year old man who has type II diabetes with diabetic polyneuropathy. He tells me that he fractured his left fifth metatarsal in June 2016 when he presented with swelling. He does not recall a specific injury. His hemoglobin A1c was apparently too high at the time for consideration of surgery and he was put in some form of offloading. Ultimately he went to surgery in December with an allograft from his calcaneus to this site, plate and screws. He had an x-ray of the foot in March that showed concerns about nonunion. He tells me that in March he had to move  and basically moved himself. He was on his foot a lot and then noticed some drainage from an open area. He has been following with his orthopedic surgeon Dr. Doran Durand. He has been applying a felt donut, dry dressing and using his heel healing sandal. 11/01/15; this is a patient I saw last week for the first time. He had a small open wound on the plantar aspect of his left foot at roughly the level of the base of his fifth metatarsal. He had a considerable degree of thickened skin around this wound on the plantar aspect which I thought was from chronic pressure on this area. He tells [place] that he had drainage over the course of the week. No systemic symptoms. 11/08/15; culture last week grew Citrobacter korseri. This should've been sensitive to the Augmentin I gave him. He has seen Dr. Doran Durand who did his initial surgery and according the patient the plan is to give this another month and then the hardware might need to come out of this. This seems like a reasonable plan. I will adjust his antibiotics to ciprofloxacin which probably should continue for at least another 2 weeks. I gave him 10 days worth today 11/22/15 the patient has completed antibiotics. He has an appointment with Dr. Doran Durand this Friday. There is improved dimensions around the wound on the left fifth metatarsal base 11/29/15; the patient has  completed antibiotics last week. Apparently his appointment with Dr. Doran Durand it is not until this Friday. Dimensions are roughly the same. 12/06/15; saw Dr. Doran Durand. No x-ray told the end of the month, next appointment June 30. We have been using Aquacel Ag 12/13/15: No major change this week. Using Aquacel AG 621/17; arrived this week with maceration around the wound. There was quite a bit of undermining which required surgical debridement. I changed him to [facility] last week, by the patient's admission he was up on this more this week 12/27/15; macerated tissue around the wound is removed with a scalpel and pickups. There is no undermining. Nonviable subcutaneous tissue and skin taken from the superior circumference of the wound is slough from the surface. READMISSION 03/06/16 since I last saw this patient at the end of June, he went for surgery on 01/11/16 by Dr. Doran Durand of orthopedics. He had a left foot irrigation and debridement, removal of hardware and placement of wound VAC. He is also been followed by Dr. Megan Salon of infectious disease and completed a six-week course of IV Rocephin for group B strep and Enterobacter in the KAYLIB, FURNESS. ([id number]) bone at the time of surgery. Apparently at the time of surgery the bone looked healthy so I don't think any bone was actually removed. He has been using silver alginate based dressings on the same wound area at the base of the left fifth metatarsal on the left. I note that he is also had arterial studies on 01/08/16, these showed a left ABI of 1.2 to and a right ABI of 1.3. Waveforms were listed as biphasic. He was not felt to have any specific arterial issues. 03/13/16; no real change in the condition of the wound at the left lateral foot at roughly the base of his fifth metatarsal. Use silver alginate last week. 03/18/16 arrives today with no open area. Being suspicious of the overlying callus I. Some of this back although I see nothing but  covering tissue here/epithelium. There is no surrounding tenderness READMISSION 04/16/16 this is a patient I discharged about a month ago. Initially a surgical wound  on the lateral aspect of his left foot which subsequently became infected. The story he is giving today that he went back into his own modified shoe started to notice pain 2 weeks ago he was seen in Dr. Nona Dell office by a physician assistant last Wednesday and according the patient was told that everything looked fine however this is clearly now broken open and he has an open wound in the same spot that we have been dealing with repetitively. Situation is complicated by the fact that he is running short of money on long-term disability. He has not taken his insulin and at least 2 weeks was previously on NovoLog short acting insulin on a sliding scale and TRESIBA 35 units at bedtime. He is no longer able to afford any of his medication he was in the x-ray on 10/15. A plain x-ray showed healed fracture of the left fifth metatarsal bone status post removal of the associated plate and screw fixation hardware. There was no acute appearing osseous abnormality. His blood work showed a white count of 9.2 with an essentially normal differential comprehensive metabolic panel was normal. Previous CT scan of the foot on 01/08/16 showed no osteomyelitis previous vascular workup showed no evidence of significant PAD on 01/13/16 04/23/16; culture I did last week grew enterococcus [ampicillin sensitive] and MRSA. He saw infectious disease yesterday. They stop the clindamycin and ordered an MRI. This is not unreasonable. All the hardware is out of the foot at this point. 04/30/16 at this point in time we are still awaiting the results of the MRI at this point in time. Patient did have an area which appears to be somewhat macerated in the proximal portion of the wound where there is overlying necrotic skin that is doing nothing more than trapping fluid  underneath. He continues to state that he does have some discomfort and again the concern is for the possibility of osteomyelitis hence the reason for the MRI order. We have been using a silver alginate dressing but again I think the reason this with his macerated as it was is that the dressing obscene could not reach the entirety of the wound due to some necrotic skin covering the proximal portion. No bleeding noted at this point in time on initial evaluation. 05/07/16 we receive the results of patient's MRI which shows that he has no evidence of osteomyelitis. This obviously is excellent news. Patient was definitely happy to hear this. He tells me the wound appears to be doing very well at this point in time and is pleased with the progress currently. 05/15/16; as noted the patient did not have osteomyelitis. He has been released by infectious disease and orthopedics. His wound is still open he had a debridement last week but complain when he got home he "bleeding for half a day". He is not had any pain. We have been using silver alginate with Kerlix gauze wrap. 05/28/16; patient arrives today with the wound in much the same condition as last time. He has a small opening on the lateral aspect of his foot however with debridement there is clear undermining medially there is no real evidence of infection here and I didn't see any point in culturing this. One would have to wonder if this isn't a simple matter of in adequate offloading as he is been using a healing sandal. 06/05/16; the open area is now on the plantar aspect of his foot and not decide. The wound almost appears to have "migrated". This was the term  use by our intake nurse. 06/12/16; open area on the plantar aspect of his foot. Base of the wound looks very healthy. This will be his second week in a total contact cast 06/19/16; patient arrived today in a total contact cast. There was some expectation from our staff and myself that this  area would be healed. Unfortunately the area was boggy and with rec pressure a fairly substantial amount of purulent drainage was obtained. Specimen obtained for culture. The patient had no complaints of systemic problems including fever or chills or instability of his diabetes. There was no pain in the foot. Nevertheless a extensive debridement was required. 06/26/16; patient's culture from the abscess last week grew a combination of MRSA and ampicillin sensitive enterococcus. I had him on Augmentin and Septra however I have elected to give him a full 10 day course of Zyvox instead as I Recently treated this combination of organisms with Augmentin and Septra before. Arrives today with no systemic symptoms 07/03/16; the patient has 2 more treatments of Zyvox and then he is finished antibiotics the wound has improved now mostly on the lateral aspect of his foot. There is still some tenderness when he walks. 07/10/16; patient arrives today with Zyvox completed. He only has a small open area remaining. 07/24/16; she arrives here with no overt open area. The covering is thick callus/eschar. Nevertheless there is no open area here. He has some tenderness underneath the area but no overt infection is observed no drainage. The patient has a deformity in the foot with this area weekly to be exposed to more pressure in his foot where nevertheless that something we are going to have to deal with going forward. The patient has diabetic workboots and diabetic shoes. He has had a long VIKRANT, PRYCE. ([id number]) difficult course with the area here. This started as a fracture at work. He had bone grafting from his calcaneus and screws. This got infected he had to have more surgery on the area. Bone at the time of that surgery I think showed enterococcus and group B strep. He had 6 weeks of Rocephin. Since then the area has waxed and waned in its difficulty. Recently he had an MRI in December that did not show  osteomyelitis nevertheless he had an abscess that grew MRSA and enterococcus which I elected to treat with Zyvox. This was in December and this wound is actually "healed" over 08/21/16; the patient came in today for his one-month follow-up visit. The area on the lateral aspect of his left foot looks much the same as some month ago. There is no evidence of an open wound here. However the patient tells me about a week after he went back to work he developed severe pain and swelling in the plantar aspect of his right foot first and fifth metatarsal heads. 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 He went to see his podiatrist at [facility]. She told him that he could not work on his feet. She told him to go back and his cam walking boot on the left. Not have open wounds obviously on the right. The patient is actually gone ahead and retired from his job because he does not feel he can work on his feet 09/18/16; patient comes back in saying he recently had some pain on the lateral aspect of his left foot. Asked that we look at this. Other than that  all of his wounds have a viable surface. He has diabetic shoes. He is retired from work. READMISSION Darrly Loberg is a 57 year old man who we cared for for a prolonged period of time in late 2017 into March 2018. At that point he had suffered a fracture of his left fifth metatarsal at about the level of the base of the fifth metatarsal. He had surgical repair by Dr. Meryl Dare and he developed a very refractory wound over the fifth metatarsal. Ultimately this became infected he required hardware removal this eventually closed over and we discharged from the clinic in March of this year. He tells me as well until roughly 3 weeks ago when he developed increasing pain in the same area making it difficult for him to sleep. He was admitted to hospital from 9/5 through 9/7 with now an  ulcer in the same area on the lateral aspect of his left foot at the level of the base of the metatarsal. X-rays and MRIs during this hospitalization were negative. Wound culture showed a combination of Escherichia coli, Morganella and Pseudomonas. He was given IV Cipro and vancomycin the hospital and then discharged home on Cipro and Doxy. He returned to the ER on 9/16 with leg swelling and discomfort. I don't think anything was added at that point although he did have an ultrasound of the left leg that was negative for DVT. He was back in the ER on 10/4 again with pain in the area he was given Bactrim but states he had a significant allergic reaction to that which has abated once he stopped the Bactrim. I don't think he had a full course. He went to see his podiatrist yesterday who recommended some form of foot soak. He has a Darco forefoot offloading boot The patient is a type II diabetic on insulin. Tells me his recent hemoglobin A1c was 7. Arterial studies done in July 2017 showed an ABI in the right of 1.3 on the left 1.2 to biphasic waveforms bilaterally. He was not felt to have arterial disease. As noted the patient has had 2 MRIs most recently on 10/4 this showed no evidence of an abscess or osteomyelitis and no change since the prior study of 03/05/17 there was nonspecific edema on the dorsum of the foot. ABIs in this clinic today 1.2 which would be unchanged from his study done in July/17 04/15/17; now down to 1 small wound on the left lateral foot. We wrapped him and applied collagen last week and the foot is fairly macerated. 04/22/17; small wound on the left lateral foot however it has some undermining. Using collagen 04/29/17; small wound on the left lateral foot some surrounding callus. Chronic damage in this area as a result of initial fracture nonhealing and secondary infection. 05/06/17; the patient arrives today with the area on the left lateral foot closed. There is thick  subcutaneous tissue with a layer of callus. I took some of the callus off just to ensure adequate closure of the underlying tissue and there is no open wound here. He has considerable deformity of this area of his foot however and unless there is an ability to offload this I think opening is going to be recurrent. He tells me he has diabetic foot wear although I have not actually seen this Readmission: 09/02/17 on evaluation today patient appears to be doing a little better compared to what he tells me what's going on in regard to his left lateral foot. He was previously seen in the ER  this past Saturday it is now Tuesday. On Saturday he actually was treated with antibiotics including Cipro 500 mg two times a day and doxycycline 100 mg two times a day. Subsequently he also did have an x-ray performed of his left foot which revealed increased degenerative changes of the base of the fifth metatarsal since comparison in 2018. There was no radiographic evidence of osteomyelitis however. Patient has previously had an MRI of the left foot which was performed on 04/03/17 in this revealed at that point a soft tissue ulceration but no evidence of osteomyelitis. Patient has previously undergone testing in regard to his ABI's by Dr. Bridgett Larsson and this showed that he did have ABI was within normal limits which does correspond with ABI's we checked here in the office today. That study was on 01/13/16. With all that being said on evaluation today there does not appear to be any evidence of a true open wound of the left lateral fifth metatarsal region. He does have tenderness noted as well is a little bit of boggy/fluctuance feeling to the LYAL, HUSTED. ([id number]) area in this region of his foot and there is definitely a very solid and firm callous noted laterally. With that being said I am not able to find any obvious opening at this point there does appear to be a spot where there appears to been a opening  very recently however patient states he has not noted any drainage from this currently. He does however have discomfort with palpation of this area this is in the 3-4/10 range only with very firm palpation this is not too significant at all. Subsequently he does have a small ulcer at the medial nail bed of the right second toe where he inadvertently pulled off a portion of the toenail softly causing this injury. Otherwise there does not appear to be any evidence of infection in regard to this right second toe. 09/23/17 on evaluation today patient actually appears to be doing well he has no open ulcerations noted at this point. With that being said he did have significant callous noted over the left lateral foot which did require callous pairing today he tolerated this without complication. Wound History Patient presents with 2 open wounds that have been present for approximately 1 week. Patient has been treating wounds in the following manner: peroxide. Laboratory tests have not been performed in the last month. Patient reportedly has tested positive for an antibiotic resistant organism. Patient reportedly has not tested positive for osteomyelitis. Patient reportedly has not had testing performed to evaluate circulation in the legs. Patient History Information obtained from Patient. Family History Diabetes - Mother,Father, Heart Disease - Father,Mother, Hypertension - Mother,Father, Stroke - Father, No family history of Cancer, Hereditary Spherocytosis, Kidney Disease, Lung Disease, Seizures, Thyroid Problems, Tuberculosis. Social History Never smoker, Marital Status - Married, Alcohol Use - Never, Drug Use - No History, Caffeine Use - Daily. Medical History Eyes Denies history of Cataracts, Glaucoma, Optic Neuritis Ear/Nose/Mouth/Throat Denies history of Chronic sinus problems/congestion, Middle ear problems Hematologic/Lymphatic Denies history of Anemia, Hemophilia, Human Immunodeficiency  Virus, Lymphedema, Sickle Cell Disease Respiratory Denies history of Aspiration, Asthma, Chronic Obstructive Pulmonary Disease (COPD), Pneumothorax, Sleep Apnea, Tuberculosis Cardiovascular Patient has history of Hypertension Denies history of Angina, Arrhythmia, Congestive Heart Failure, Coronary Artery Disease, Deep Vein Thrombosis, Hypotension, Myocardial Infarction, Peripheral Arterial Disease, Peripheral Venous Disease, Phlebitis, Vasculitis Gastrointestinal Denies history of Cirrhosis , Colitis, Crohn s, Hepatitis A, Hepatitis B, Hepatitis C Endocrine Patient has history of Type II  Diabetes Denies history of Type I Diabetes Genitourinary Denies history of End Stage Renal Disease Immunological Denies history of Lupus Erythematosus, Raynaud s, Scleroderma Integumentary (Skin) Denies history of History of Burn, History of pressure wounds Musculoskeletal Denies history of Gout, Rheumatoid Arthritis, Osteoarthritis, Osteomyelitis Neurologic Patient has history of Neuropathy Denies history of Dementia, Quadriplegia, Paraplegia, Seizure Disorder Oncologic ALVESTER, EADS ([id number]) Denies history of Received Chemotherapy, Received Radiation Psychiatric Denies history of Anorexia/bulimia, Confinement Anxiety Patient is treated with Insulin. Blood sugar is tested. Review of Systems (ROS) Constitutional Symptoms (General Health) Denies complaints or symptoms of Fever, Chills. Psychiatric The patient has no complaints or symptoms. Objective Constitutional Well-nourished and well-hydrated in no acute distress. Vitals Time Taken: 3:20 PM, Height: 70 in, Weight: 270 lbs, BMI: 38.7, Temperature: 98.1 F, Pulse: 90 bpm, Respiratory Rate: 18 breaths/min, Blood Pressure: 160/73 mmHg. Respiratory normal breathing without difficulty. Psychiatric this patient is able to make decisions and demonstrates good insight into disease process. Alert and Oriented x 3. pleasant and  cooperative. General Notes: Again I did perform pairing of the callous overlying the left lateral foot which patient tolerated without any trouble whatsoever there was no bleeding and overall he states this did feel as well as look better post shaving. Obviously he still has a significant area firm callous but again there was no wound opening which is great news. Integumentary (Hair, Skin) Wound #10 status is Open. Original cause of wound was Gradually Appeared. The wound is located on the Left,Lateral Foot. The wound measures 0cm length x 0cm width x 0cm depth; 0cm^2 area and 0cm^3 volume. There is no tunneling or undermining noted. There is a none present amount of drainage noted. The wound margin is flat and intact. There is no granulation within the wound bed. There is no necrotic tissue within the wound bed. The periwound skin appearance exhibited: Callus. The periwound skin appearance did not exhibit: Crepitus, Excoriation, Induration, Rash, Scarring, Dry/Scaly, Maceration, Atrophie Blanche, Cyanosis, Ecchymosis, Hemosiderin Staining, Mottled, Pallor, Rubor, Erythema. Periwound temperature was noted as No Abnormality. The periwound has tenderness on palpation. Wound #11 status is Healed - Epithelialized. Original cause of wound was Gradually Appeared. The wound is located on the Right Toe Second. The wound measures 0cm length x 0cm width x 0cm depth; 0cm^2 area and 0cm^3 volume. There is no tunneling or undermining noted. There is a none present amount of drainage noted. The wound margin is flat and intact. There is no granulation within the wound bed. There is no necrotic tissue within the wound bed. The periwound skin appearance exhibited: Callus. The periwound skin appearance did not exhibit: Crepitus, Excoriation, Induration, Rash, Scarring, Dry/Scaly, Maceration, Atrophie Blanche, Cyanosis, Ecchymosis, Hemosiderin Staining, Mottled, Pallor, Rubor, Erythema. Periwound temperature was  noted as No Abnormality. The periwound has tenderness on palpation. CHAD, DONOGHUE ([id number]) Assessment Active Problems ICD-10 E11.621 - Type 2 diabetes mellitus with foot ulcer L97.512 - Non-pressure chronic ulcer of other part of right foot with fat layer exposed L97.528 - Non-pressure chronic ulcer of other part of left foot with other specified severity Procedures Wound #10 Pre-procedure diagnosis of Wound #10 is a Diabetic Wound/Ulcer of the Lower Extremity located on the Left,Lateral Foot . An Callus Pairing procedure was performed by STONE III, HOYT E., PA-C. Post procedure Diagnosis Wound #10: Same as Pre-Procedure Wound #11 Pre-procedure diagnosis of Wound #11 is a Diabetic Wound/Ulcer of the Lower Extremity located on the Right Toe Second . An Callus Pairing procedure was performed by STONE III,  HOYT E., PA-C. Post procedure Diagnosis Wound #11: Same as Pre-Procedure Plan Discharge From [facility] Services: Discharge from [facility] - Please call our office if you have any questions or concerns. I am going to recommend that we discontinue wound care services at this point patient is in agreement with plan. We will subsequently see him back in the future as needed if anything changes or worsens hopefully that will not be the case. He was appreciative of me working on the callous on the side of his foot in trying to clear away as much of that as I could. Electronic Signature(s) Signed: 09/24/2017 12:10:29 AM By: Worthy Keeler PA-C Entered By: Worthy Keeler on 09/23/2017 23:51:04 [name] ([id number]) -------------------------------------------------------------------------------- ROS/PFSH Details Patient Name: ARRAN, FESSEL. Date of Service: 09/23/2017 2:45 PM Medical Record Number: [id number] Patient Account Number: [id number] Date of Birth/Sex: 08/01/1960 (57 y.o. M) Treating RN: Ahmed Prima Primary Care Provider: Lorelee Market Other  Clinician: Referring Provider: Lorelee Market Treating Provider/Extender: Melburn Hake, HOYT Weeks in Treatment: 3 Label Progress Note Print Version as History and Physical for this encounter Information Obtained From Patient Wound History Do you currently have one or more open woundso Yes How many open wounds do you currently haveo 2 Approximately how long have you had your woundso 1 week How have you been treating your wound(s) until nowo peroxide Has your wound(s) ever healed and then re-openedo No Have you had any lab work done in the past montho No Have you tested positive for an antibiotic resistant organism (MRSA, VRE)o Yes Have you tested positive for osteomyelitis (bone infection)o No Have you had any tests for circulation on your legso No Constitutional Symptoms (General Health) Complaints and Symptoms: Negative for: Fever; Chills Eyes Medical History: Negative for: Cataracts; Glaucoma; Optic Neuritis Ear/Nose/Mouth/Throat Medical History: Negative for: Chronic sinus problems/congestion; Middle ear problems Hematologic/Lymphatic Medical History: Negative for: Anemia; Hemophilia; Human Immunodeficiency Virus; Lymphedema; Sickle Cell Disease Respiratory Medical History: Negative for: Aspiration; Asthma; Chronic Obstructive Pulmonary Disease (COPD); Pneumothorax; Sleep Apnea; Tuberculosis Cardiovascular Medical History: Positive for: Hypertension Negative for: Angina; Arrhythmia; Congestive Heart Failure; Coronary Artery Disease; Deep Vein Thrombosis; Hypotension; Myocardial Infarction; Peripheral Arterial Disease; Peripheral Venous Disease; Phlebitis; Vasculitis Gastrointestinal KARSTON, HYLAND ([id number]) Medical History: Negative for: Cirrhosis ; Colitis; Crohnos; Hepatitis A; Hepatitis B; Hepatitis C Endocrine Medical History: Positive for: Type II Diabetes Negative for: Type I Diabetes Time with diabetes: 20 yrs Treated with: Insulin Blood sugar tested  every day: Yes Tested : 3 times weekly Genitourinary Medical History: Negative for: End Stage Renal Disease Immunological Medical History: Negative for: Lupus Erythematosus; Raynaudos; Scleroderma Integumentary (Skin) Medical History: Negative for: History of Burn; History of pressure wounds Musculoskeletal Medical History: Negative for: Gout; Rheumatoid Arthritis; Osteoarthritis; Osteomyelitis Neurologic Medical History: Positive for: Neuropathy Negative for: Dementia; Quadriplegia; Paraplegia; Seizure Disorder Oncologic Medical History: Negative for: Received Chemotherapy; Received Radiation Psychiatric Complaints and Symptoms: No Complaints or Symptoms Medical History: Negative for: Anorexia/bulimia; Confinement Anxiety Immunizations Pneumococcal Vaccine: Received Pneumococcal Vaccination: No Immunization Notes: up to date Implantable Devices ERDEM, NAAS ([id number]) Family and Social History Cancer: No; Diabetes: Yes - Mother,Father; Heart Disease: Yes - Father,Mother; Hereditary Spherocytosis: No; Hypertension: Yes - Mother,Father; Kidney Disease: No; Lung Disease: No; Seizures: No; Stroke: Yes - Father; Thyroid Problems: No; Tuberculosis: No; Never smoker; Marital Status - Married; Alcohol Use: Never; Drug Use: No History; Caffeine Use: Daily; Financial Concerns: No; Food, Clothing or Shelter Needs: No; Support System Lacking: No; Transportation Concerns: No;  Advanced Directives: No; Patient does not want information on Advanced Directives; Do not resuscitate: No; Living Will: No; Medical Power of Attorney: No Physician Affirmation I have reviewed and agree with the above information. Electronic Signature(s) Signed: 09/24/2017 12:10:29 AM By: Worthy Keeler PA-C Signed: 09/24/2017 4:37:47 PM By: Alric Quan Entered By: Worthy Keeler on 09/23/2017 23:49:49 [name]  ([id number]) -------------------------------------------------------------------------------- SuperBill Details Patient Name: BRIANA, NEWMAN. Date of Service: 09/23/2017 Medical Record Number: [id number] Patient Account Number: [id number] Date of Birth/Sex: [birth year] (57 y.o. M) Treating RN: Ahmed Prima Primary Care Provider: Lorelee Market Other Clinician: Referring Provider: Lorelee Market Treating Provider/Extender: Melburn Hake, HOYT Weeks in Treatment: 3 Diagnosis Coding ICD-10 Codes Code Description E11.621 Type 2 diabetes mellitus with foot ulcer L97.512 Non-pressure chronic ulcer of other part of right foot with fat layer exposed L97.528 Non-pressure chronic ulcer of other part of left foot with other specified severity Facility Procedures CPT4 Code Description: 82641583 11055 - PARE BENIGN LES; SGL ICD-10 Diagnosis Description L97.528 Non-pressure chronic ulcer of other part of left foot with other Modifier: specified sever Quantity: 2 ity Physician Procedures CPT4 Code Description: 0940768 [id number] - WC PHYS PARE BENIGN LES; SGL ICD-10 Diagnosis Description L97.528 Non-pressure chronic ulcer of other part of left foot with other Modifier: specified severi Quantity: 2 ty Electronic Signature(s) Signed: 09/24/2017 12:10:29 AM By: Worthy Keeler PA-C Entered By: Worthy Keeler on 09/23/2017 23:51:17

## 2017-09-25 NOTE — Progress Notes (Signed)
KOSTA, SCHNITZLER ([id number]) Visit Report for 09/23/2017 Arrival Information Details Patient Name: [name], [name]. Date of Service: 09/23/2017 2:45 PM Medical Record Number: [id number] Patient Account Number: [id number] Date of Birth/Sex: 11/16/1960 (57 y.o. M) Treating RN: Montey Hora Primary Care Markala Sitts: Lorelee Market Other Clinician: Referring Khalen Styer: Lorelee Market Treating Kaliah Haddaway/Extender: Melburn Hake, HOYT Weeks in Treatment: 3 Visit Information History Since Last Visit Added or deleted any medications: No Patient Arrived: Ambulatory Any new allergies or adverse reactions: No Arrival Time: 15:18 Had a fall or experienced change in No Accompanied By: self activities of daily living that may affect Transfer Assistance: None risk of falls: Patient Identification Verified: Yes Signs or symptoms of abuse/neglect since last visito No Secondary Verification Process Completed: Yes Hospitalized since last visit: No Patient Has Alerts: Yes Implantable device outside of the clinic excluding No Patient Alerts: DMII cellular tissue based products placed in the center since last visit: Has Dressing in Place as Prescribed: Yes Pain Present Now: No Electronic Signature(s) Signed: 09/23/2017 4:44:49 PM By: Montey Hora Entered By: Montey Hora on 09/23/2017 15:19:52 [name] ([id number]) -------------------------------------------------------------------------------- Encounter Discharge Information Details Patient Name: [name]. Date of Service: 09/23/2017 2:45 PM Medical Record Number: [id number] Patient Account Number: [id number] Date of Birth/Sex: [birth year] (57 y.o. M) Treating RN: Ahmed Prima Primary Care Lorna Strother: Lorelee Market Other Clinician: Referring Yovana Scogin: Lorelee Market Treating Emaree Chiu/Extender: Melburn Hake, HOYT Weeks in Treatment: 3 Encounter Discharge Information Items Discharge Pain Level: 0 Discharge Condition:  Stable Ambulatory Status: Ambulatory Discharge Destination: Home Transportation: Private Auto Accompanied By: self Schedule Follow-up Appointment: No Medication Reconciliation completed and No provided to Patient/Care Shristi Scheib: Provided on Clinical Summary of Care: 09/23/2017 Form Type Recipient Paper Patient HP Electronic Signature(s) Signed: 09/24/2017 10:05:17 AM By: Ruthine Dose Entered By: Ruthine Dose on 09/23/2017 16:00:33 [name] ([id number]) -------------------------------------------------------------------------------- Lower Extremity Assessment Details Patient Name: [name]. Date of Service: 09/23/2017 2:45 PM Medical Record Number: [id number] Patient Account Number: [id number] Date of Birth/Sex: May 04, 1961 (57 y.o. M) Treating RN: Montey Hora Primary Care Jarod Bozzo: Lorelee Market Other Clinician: Referring Marleny Faller: Lorelee Market Treating Mayara Paulson/Extender: Melburn Hake, HOYT Weeks in Treatment: 3 Vascular Assessment Pulses: Dorsalis Pedis Palpable: [Left:Yes] [Right:Yes] Posterior Tibial Extremity colors, hair growth, and conditions: Extremity Color: [Left:Hyperpigmented] [Right:Hyperpigmented] Hair Growth on Extremity: [Left:No] [Right:No] Temperature of Extremity: [Left:Warm] [Right:Warm] Capillary Refill: [Left:< 3 seconds] [Right:< 3 seconds] Electronic Signature(s) Signed: 09/23/2017 4:44:49 PM By: Montey Hora Entered By: Montey Hora on 09/23/2017 15:25:50 [name] ([id number]) -------------------------------------------------------------------------------- Multi Wound Chart Details Patient Name: [name]. Date of Service: 09/23/2017 2:45 PM Medical Record Number: [id number] Patient Account Number: [id number] Date of Birth/Sex: 1961-05-19 (57 y.o. M) Treating RN: Ahmed Prima Primary Care Tayden Nichelson: Lorelee Market Other Clinician: Referring Navah Grondin: Lorelee Market Treating Helyne Genther/Extender:  Melburn Hake, HOYT Weeks in Treatment: 3 Vital Signs Height(in): 70 Pulse(bpm): 90 Weight(lbs): 270 Blood Pressure(mmHg): 160/73 Body Mass Index(BMI): 39 Temperature(F): 98.1 Respiratory Rate 18 (breaths/min): Photos: [N/A:N/A] Wound Location: Left Foot - Lateral Right Toe Second N/A Wounding Event: Gradually Appeared Gradually Appeared N/A Primary Etiology: Diabetic Wound/Ulcer of the Diabetic Wound/Ulcer of the N/A Lower Extremity Lower Extremity Comorbid History: Hypertension, Type II Hypertension, Type II N/A Diabetes, Neuropathy Diabetes, Neuropathy Date Acquired: 08/25/2017 08/25/2017 N/A Weeks of Treatment: 3 3 N/A Wound Status: Open Open N/A Pending Amputation on Yes Yes N/A Presentation: Measurements L x W x D 0.1x0.1x0.1 0.1x0.1x0.1 N/A (cm) Area (cm) : 0.008 0.008 N/A Volume (cm) : 0.001 0.001 N/A % Reduction  in Area: 0.00% 74.20% N/A % Reduction in Volume: 0.00% 66.70% N/A Classification: Unable to visualize wound bed Grade 1 N/A Exudate Amount: None Present Medium N/A Exudate Type: N/A Serous N/A Exudate Color: N/A amber N/A Wound Margin: Flat and Intact Flat and Intact N/A Granulation Amount: None Present (0%) None Present (0%) N/A Necrotic Amount: None Present (0%) None Present (0%) N/A Exposed Structures: Fascia: No Fascia: No N/A Fat Layer (Subcutaneous Fat Layer (Subcutaneous Tissue) Exposed: No Tissue) Exposed: No Tendon: No Tendon: No Muscle: No Muscle: No SAVAUGHN, KARWOWSKI. ([id number]) Joint: No Joint: No Bone: No Bone: No Epithelialization: None None N/A Periwound Skin Texture: Callus: Yes Callus: Yes N/A Excoriation: No Excoriation: No Induration: No Induration: No Crepitus: No Crepitus: No Rash: No Rash: No Scarring: No Scarring: No Periwound Skin Moisture: Maceration: No Maceration: No N/A Dry/Scaly: No Dry/Scaly: No Periwound Skin Color: Atrophie Blanche: No Atrophie Blanche: No N/A Cyanosis: No Cyanosis:  No Ecchymosis: No Ecchymosis: No Erythema: No Erythema: No Hemosiderin Staining: No Hemosiderin Staining: No Mottled: No Mottled: No Pallor: No Pallor: No Rubor: No Rubor: No Temperature: No Abnormality No Abnormality N/A Tenderness on Palpation: Yes Yes N/A Wound Preparation: Ulcer Cleansing: Ulcer Cleansing: N/A Rinsed/Irrigated with Saline Rinsed/Irrigated with Saline Topical Anesthetic Applied: Topical Anesthetic Applied: Other: lidocaine 4% Other: lidocaine 4% Treatment Notes Electronic Signature(s) Signed: 09/24/2017 4:37:47 PM By: Alric Quan Entered By: Alric Quan on 09/23/2017 15:44:14 [name] ([id number]) -------------------------------------------------------------------------------- [place] Details Patient Name: ZACHORY, MANGUAL. Date of Service: 09/23/2017 2:45 PM Medical Record Number: [id number] Patient Account Number: [id number] Date of Birth/Sex: 1960/11/18 (57 y.o. M) Treating RN: Ahmed Prima Primary Care Omunique Pederson: Lorelee Market Other Clinician: Referring Dedee Liss: Lorelee Market Treating Marche Hottenstein/Extender: Melburn Hake, HOYT Weeks in Treatment: 3 Active Inactive Electronic Signature(s) Signed: 09/24/2017 4:37:47 PM By: Alric Quan Entered By: Alric Quan on 09/23/2017 15:57:30 [name] ([id number]) -------------------------------------------------------------------------------- Pain Assessment Details Patient Name: [name]. Date of Service: 09/23/2017 2:45 PM Medical Record Number: [id number] Patient Account Number: [id number] Date of Birth/Sex: 1960-12-11 (57 y.o. M) Treating RN: Montey Hora Primary Care Colbi Schiltz: Lorelee Market Other Clinician: Referring Willy Pinkerton: Lorelee Market Treating Servando Kyllonen/Extender: Melburn Hake, HOYT Weeks in Treatment: 3 Active Problems Location of Pain Severity and Description of Pain Patient Has Paino No Site Locations Pain Management  and Medication Current Pain Management: Notes Topical or injectable lidocaine is offered to patient for acute pain when surgical debridement is performed. If needed, Patient is instructed to use over the counter pain medication for the following 24-48 hours after debridement. Wound care MDs do not prescribed pain medications. Patient has chronic pain or uncontrolled pain. Patient has been instructed to make an appointment with their Primary Care Physician for pain management. Electronic Signature(s) Signed: 09/23/2017 4:44:49 PM By: Montey Hora Entered By: Montey Hora on 09/23/2017 15:20:08 [name] ([id number]) -------------------------------------------------------------------------------- Patient/Caregiver Education Details Patient Name: [name]. Date of Service: 09/23/2017 2:45 PM Medical Record Number: [id number] Patient Account Number: [id number] Date of Birth/Gender: [birth year] (57 y.o. M) Treating RN: Ahmed Prima Primary Care Physician: Lorelee Market Other Clinician: Referring Physician: Lorelee Market Treating Physician/Extender: Sharalyn Ink in Treatment: 3 Education Assessment Education Provided To: Patient Education Topics Provided Wound/Skin Impairment: Handouts: Other: Please call our office if you have any questions or concerns. Methods: Explain/Verbal Responses: State content correctly Electronic Signature(s) Signed: 09/24/2017 4:37:47 PM By: Alric Quan Entered By: Alric Quan on 09/23/2017 15:59:52 [name] ([id number]) -------------------------------------------------------------------------------- Wound Assessment Details Patient Name: YPPJK,  Dorean F. Date of Service: 09/23/2017 2:45 PM Medical Record Number: [id number] Patient Account Number: [id number] Date of Birth/Sex: [birth year] (57 y.o. M) Treating RN: Ahmed Prima Primary Care Marcayla Budge: Lorelee Market Other Clinician: Referring  Christyna Letendre: Lorelee Market Treating Maryl Blalock/Extender: Melburn Hake, HOYT Weeks in Treatment: 3 Wound Status Wound Number: 10 Primary Etiology: Diabetic Wound/Ulcer of the Lower Extremity Wound Location: Left Foot - Lateral Wound Status: Open Wounding Event: Gradually Appeared Comorbid Hypertension, Type II Diabetes, Date Acquired: 08/25/2017 History: Neuropathy Weeks Of Treatment: 3 Clustered Wound: No Pending Amputation On Presentation Photos Wound Measurements Length: (cm) 0 % Re Width: (cm) 0 % Re Depth: (cm) 0 Epit Area: (cm) 0 Tun Volume: (cm) 0 Und duction in Area: 100% duction in Volume: 100% helialization: Large (67-100%) neling: No ermining: No Wound Description Classification: Unable to visualize wound bed Wound Margin: Flat and Intact Exudate Amount: None Present Foul Odor After Cleansing: No Slough/Fibrino No Wound Bed Granulation Amount: None Present (0%) Exposed Structure Necrotic Amount: None Present (0%) Fascia Exposed: No Fat Layer (Subcutaneous Tissue) Exposed: No Tendon Exposed: No Muscle Exposed: No Joint Exposed: No Bone Exposed: No Periwound Skin Texture Texture Color No Abnormalities Noted: No No Abnormalities Noted: No YONG, GRIESER ([id number]) Callus: Yes Atrophie Blanche: No Crepitus: No Cyanosis: No Excoriation: No Ecchymosis: No Induration: No Erythema: No Rash: No Hemosiderin Staining: No Scarring: No Mottled: No Pallor: No Moisture Rubor: No No Abnormalities Noted: No Dry / Scaly: No Temperature / Pain Maceration: No Temperature: No Abnormality Tenderness on Palpation: Yes Wound Preparation Ulcer Cleansing: Rinsed/Irrigated with Saline Topical Anesthetic Applied: None Electronic Signature(s) Signed: 09/24/2017 4:37:47 PM By: Alric Quan Entered By: Alric Quan on 09/23/2017 15:57:07 [name] ([id number]) -------------------------------------------------------------------------------- Wound  Assessment Details Patient Name: [name]. Date of Service: 09/23/2017 2:45 PM Medical Record Number: [id number] Patient Account Number: [id number] Date of Birth/Sex: 02-28-1961 (57 y.o. M) Treating RN: Ahmed Prima Primary Care Obera Stauch: Lorelee Market Other Clinician: Referring Jovanna Hodges: Lorelee Market Treating Sharif Rendell/Extender: Melburn Hake, HOYT Weeks in Treatment: 3 Wound Status Wound Number: 11 Primary Etiology: Diabetic Wound/Ulcer of the Lower Extremity Wound Location: Right Toe Second Wound Status: Healed - Epithelialized Wounding Event: Gradually Appeared Comorbid Hypertension, Type II Diabetes, Date Acquired: 08/25/2017 History: Neuropathy Weeks Of Treatment: 3 Clustered Wound: No Pending Amputation On Presentation Photos Wound Measurements Length: (cm) 0 % Red Width: (cm) 0 % Red Depth: (cm) 0 Epith Area: (cm) 0 Tunn Volume: (cm) 0 Unde uction in Area: 100% uction in Volume: 100% elialization: Large (67-100%) eling: No rmining: No Wound Description Classification: Grade 1 Wound Margin: Flat and Intact Exudate Amount: None Present Foul Odor After Cleansing: No Slough/Fibrino No Wound Bed Granulation Amount: None Present (0%) Exposed Structure Necrotic Amount: None Present (0%) Fascia Exposed: No Fat Layer (Subcutaneous Tissue) Exposed: No Tendon Exposed: No Muscle Exposed: No Joint Exposed: No Bone Exposed: No Periwound Skin Texture Texture Color No Abnormalities Noted: No No Abnormalities Noted: No LARK, RUNK ([id number]) Callus: Yes Atrophie Blanche: No Crepitus: No Cyanosis: No Excoriation: No Ecchymosis: No Induration: No Erythema: No Rash: No Hemosiderin Staining: No Scarring: No Mottled: No Pallor: No Moisture Rubor: No No Abnormalities Noted: No Dry / Scaly: No Temperature / Pain Maceration: No Temperature: No Abnormality Tenderness on Palpation: Yes Wound Preparation Ulcer Cleansing: Rinsed/Irrigated  with Saline Topical Anesthetic Applied: None Electronic Signature(s) Signed: 09/24/2017 4:37:47 PM By: Alric Quan Entered By: Alric Quan on 09/23/2017 15:47:41 [name] ([id number]) -------------------------------------------------------------------------------- Vitals Details Patient Name: [name].  Date of Service: 09/23/2017 2:45 PM Medical Record Number: [id number] Patient Account Number: [id number] Date of Birth/Sex: 03-20-1961 (57 y.o. M) Treating RN: Montey Hora Primary Care Lianne Carreto: Lorelee Market Other Clinician: Referring Carlotta Telfair: Lorelee Market Treating Aseem Sessums/Extender: Melburn Hake, HOYT Weeks in Treatment: 3 Vital Signs Time Taken: 15:20 Temperature (F): 98.1 Height (in): 70 Pulse (bpm): 90 Weight (lbs): 270 Respiratory Rate (breaths/min): 18 Body Mass Index (BMI): 38.7 Blood Pressure (mmHg): 160/73 Reference Range: 80 - 120 mg / dl Electronic Signature(s) Signed: 09/23/2017 4:44:49 PM By: Montey Hora Entered By: Montey Hora on 09/23/2017 15:21:17

## 2017-11-01 IMAGING — DX DG FOOT COMPLETE 3+V*L*
3 series · 3 of 3 positions shown · non-contrast
Comparison: X-ray March 05, 2017 and MRI March 05, 2017

CLINICAL DATA: Wound infection to the left lower leg, hospitalized
about a week ago. No improvement in wound. Recent injury to the
location of the wound.

EXAM:
LEFT FOOT - COMPLETE 3+ VIEW

[foot ap]
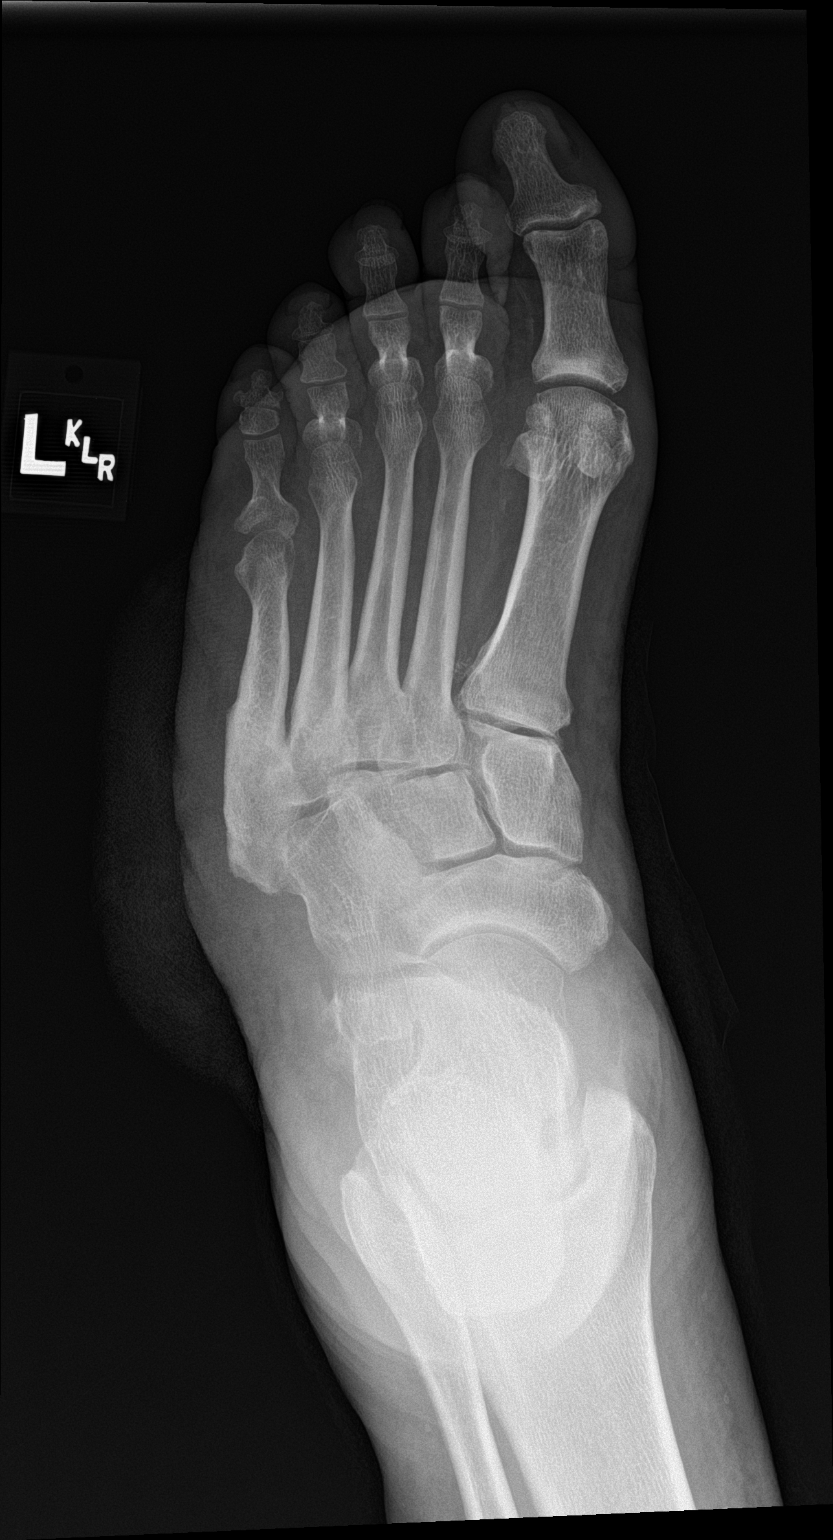

[foot obl]
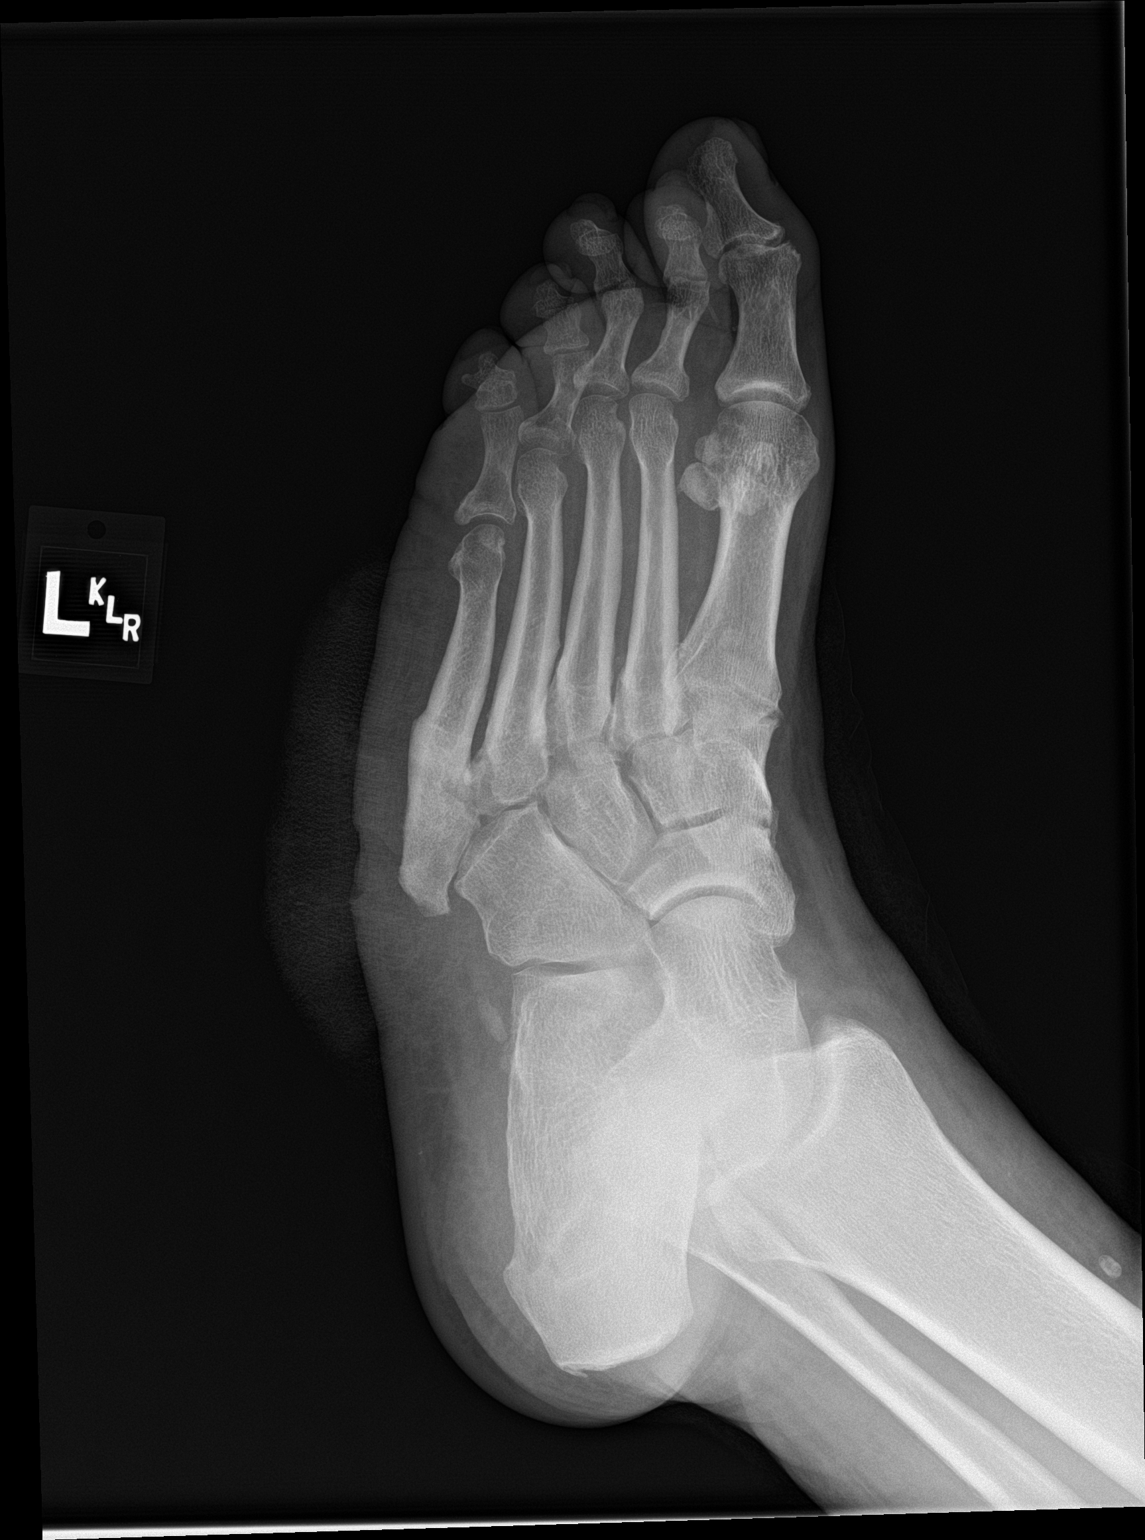

[foot lat]
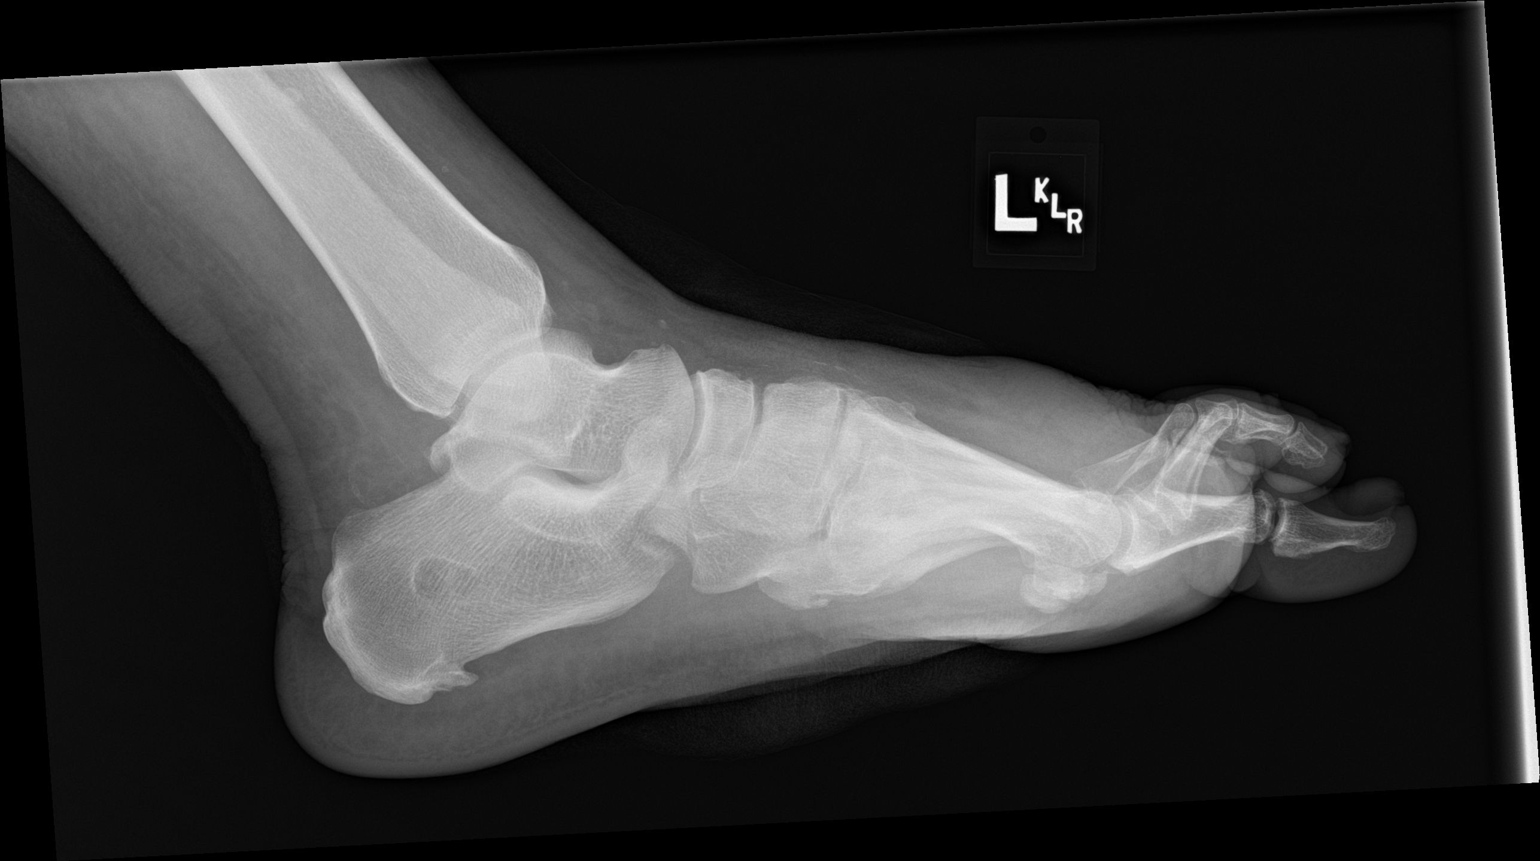

[3 of 3 positions shown; findings below may reference images not displayed]

FINDINGS: There is a skin defect over the base of the fifth metatarsal. This
is more prominent in the interval. There is sclerosis and deformity
in the proximal aspect of the fifth metatarsal with irregularity
along the base of the fifth metatarsal seen on the lateral view.
These findings are stable. No osteomyelitis was seen in this region
on the MRI March 05, 2017. There is increasing soft tissue
swelling over the dorsum of the foot on the lateral view. Vascular
calcifications are noted. Lucency in the calcaneus is stable, of no
acute significance. No fractures are identified.
IMPRESSION: 1. Increasing soft tissue swelling over the dorsum of the foot.
2. The skin defect over the base of the fifth metatarsal is more
prominent in the interval. Recommend clinical correlation.
3. Irregularity and sclerosis in the proximal fifth metatarsal is
unchanged since March 05, 2017. An MRI at that time demonstrated
no evidence of osteomyelitis.

## 2017-11-04 ENCOUNTER — Other Ambulatory Visit: Payer: Self-pay

## 2017-11-04 ENCOUNTER — Emergency Department
Admission: EM | Admit: 2017-11-04 | Discharge: 2017-11-04 | Disposition: A | Discharge status: Home / Self Care | Payer: BLUE CROSS/BLUE SHIELD | Attending: Emergency Medicine | Admitting: Emergency Medicine

## 2017-11-04 ENCOUNTER — Encounter: Payer: Self-pay | Admitting: Emergency Medicine

## 2017-11-04 DIAGNOSIS — M79671 Pain in right foot: Secondary | ICD-10-CM | POA: Insufficient documentation

## 2017-11-04 DIAGNOSIS — Z5321 Procedure and treatment not carried out due to patient leaving prior to being seen by health care provider: Secondary | ICD-10-CM | POA: Diagnosis not present

## 2017-11-04 LAB — CBC WITH DIFFERENTIAL/PLATELET
Basophils Absolute: 0 10*3/uL (ref 0–0.1)
Basophils Relative: 1 %
Eosinophils Absolute: 0.2 10*3/uL (ref 0–0.7)
Eosinophils Relative: 3 %
HEMATOCRIT: 41.7 % (ref 40.0–52.0)
HEMOGLOBIN: 14.4 g/dL (ref 13.0–18.0)
LYMPHS PCT: 27 %
Lymphs Abs: 1.8 10*3/uL (ref 1.0–3.6)
MCH: 31.9 pg (ref 26.0–34.0)
MCHC: 34.5 g/dL (ref 32.0–36.0)
MCV: 92.5 fL (ref 80.0–100.0)
Monocytes Absolute: 1.1 10*3/uL — ABNORMAL HIGH (ref 0.2–1.0)
Monocytes Relative: 16 %
NEUTROS ABS: 3.7 10*3/uL (ref 1.4–6.5)
NEUTROS PCT: 53 %
PLATELETS: 221 10*3/uL (ref 150–440)
RBC: 4.51 MIL/uL (ref 4.40–5.90)
RDW: 13.9 % (ref 11.5–14.5)
WBC: 6.8 10*3/uL (ref 3.8–10.6)

## 2017-11-04 LAB — BASIC METABOLIC PANEL
ANION GAP: 11 (ref 5–15)
BUN: 21 mg/dL — ABNORMAL HIGH (ref 6–20)
CO2: 25 mmol/L (ref 22–32)
Calcium: 9 mg/dL (ref 8.9–10.3)
Chloride: 97 mmol/L — ABNORMAL LOW (ref 101–111)
Creatinine, Ser: 0.93 mg/dL (ref 0.61–1.24)
GFR calc Af Amer: >60 mL/min (ref >60)
GLUCOSE: 235 mg/dL — AB (ref 65–99)
POTASSIUM: 4.1 mmol/L (ref 3.5–5.1)
Sodium: 133 mmol/L — ABNORMAL LOW (ref 135–145)

## 2017-11-04 NOTE — ED Notes (Signed)
It is patients left foot that has the wound not the right foot.

## 2017-11-04 NOTE — ED Notes (Signed)
Pt reports that it is his left foot that has the wound.

## 2017-11-04 NOTE — ED Triage Notes (Signed)
Pt reports that he has an open wound on his right foot. He reports that he his wound has a smell and the pain is unbearable. He states that he is trying to get disability but has not been able to get it so he stands on his foot causing more pain. He states that he has an appt next Tuesday at the wound clinic.

## 2017-11-05 ENCOUNTER — Emergency Department
Admission: EM | Admit: 2017-11-05 | Discharge: 2017-11-05 | Disposition: A | Discharge status: Home / Self Care | Payer: BLUE CROSS/BLUE SHIELD | Attending: Emergency Medicine | Admitting: Emergency Medicine

## 2017-11-05 ENCOUNTER — Encounter: Payer: Self-pay | Admitting: Emergency Medicine

## 2017-11-05 ENCOUNTER — Other Ambulatory Visit: Payer: Self-pay

## 2017-11-05 ENCOUNTER — Emergency Department: Payer: BLUE CROSS/BLUE SHIELD

## 2017-11-05 DIAGNOSIS — Z794 Long term (current) use of insulin: Secondary | ICD-10-CM | POA: Diagnosis not present

## 2017-11-05 DIAGNOSIS — Z89421 Acquired absence of other right toe(s): Secondary | ICD-10-CM | POA: Diagnosis not present

## 2017-11-05 DIAGNOSIS — I10 Essential (primary) hypertension: Secondary | ICD-10-CM | POA: Insufficient documentation

## 2017-11-05 DIAGNOSIS — L97429 Non-pressure chronic ulcer of left heel and midfoot with unspecified severity: Secondary | ICD-10-CM | POA: Insufficient documentation

## 2017-11-05 DIAGNOSIS — E1140 Type 2 diabetes mellitus with diabetic neuropathy, unspecified: Secondary | ICD-10-CM | POA: Insufficient documentation

## 2017-11-05 DIAGNOSIS — Z79899 Other long term (current) drug therapy: Secondary | ICD-10-CM | POA: Diagnosis not present

## 2017-11-05 DIAGNOSIS — E11621 Type 2 diabetes mellitus with foot ulcer: Secondary | ICD-10-CM | POA: Diagnosis not present

## 2017-11-05 DIAGNOSIS — Z7982 Long term (current) use of aspirin: Secondary | ICD-10-CM | POA: Insufficient documentation

## 2017-11-05 DIAGNOSIS — Z9119 Patient's noncompliance with other medical treatment and regimen: Secondary | ICD-10-CM | POA: Insufficient documentation

## 2017-11-05 DIAGNOSIS — M79672 Pain in left foot: Secondary | ICD-10-CM | POA: Diagnosis present

## 2017-11-05 LAB — BASIC METABOLIC PANEL
Anion gap: 9 (ref 5–15)
BUN: 15 mg/dL (ref 6–20)
CALCIUM: 9.1 mg/dL (ref 8.9–10.3)
CO2: 27 mmol/L (ref 22–32)
CREATININE: 0.83 mg/dL (ref 0.61–1.24)
Chloride: 99 mmol/L — ABNORMAL LOW (ref 101–111)
GFR calc Af Amer: >60 mL/min (ref >60)
GFR calc non Af Amer: >60 mL/min (ref >60)
GLUCOSE: 160 mg/dL — AB (ref 65–99)
Potassium: 4.4 mmol/L (ref 3.5–5.1)
Sodium: 135 mmol/L (ref 135–145)

## 2017-11-05 LAB — CBC WITH DIFFERENTIAL/PLATELET
BASOS ABS: 0 10*3/uL (ref 0–0.1)
BASOS PCT: 1 %
EOS PCT: 3 %
Eosinophils Absolute: 0.2 10*3/uL (ref 0–0.7)
HEMATOCRIT: 46.7 % (ref 40.0–52.0)
Hemoglobin: 16 g/dL (ref 13.0–18.0)
Lymphocytes Relative: 23 %
Lymphs Abs: 1.7 10*3/uL (ref 1.0–3.6)
MCH: 31.8 pg (ref 26.0–34.0)
MCHC: 34.2 g/dL (ref 32.0–36.0)
MCV: 93 fL (ref 80.0–100.0)
MONO ABS: 1.1 10*3/uL — AB (ref 0.2–1.0)
Monocytes Relative: 15 %
Neutro Abs: 4.4 10*3/uL (ref 1.4–6.5)
Neutrophils Relative %: 58 %
Platelets: 230 10*3/uL (ref 150–440)
RBC: 5.03 MIL/uL (ref 4.40–5.90)
RDW: 13.8 % (ref 11.5–14.5)
WBC: 7.5 10*3/uL (ref 3.8–10.6)

## 2017-11-05 MED ORDER — TRAMADOL HCL 50 MG PO TABS: 50.0000 mg | ORAL_TABLET | Freq: Four times a day (QID) | ORAL | 0 refills | Status: DC | PRN

## 2017-11-05 NOTE — ED Provider Notes (Addendum)
[facility] Emergency Department Provider Note  ____________________________________________   I have reviewed the triage vital signs and the nursing notes. Where available I have reviewed prior notes and, if possible and indicated, outside hospital notes.    HISTORY  Chief Complaint Foot Pain    HPI [name] is a 57 y.o. male today complaining of chronic foot pain.  He has had pain in his foot for 3 years.  There is really no change in the foot itself, he states that it may be slightly more swollen.  He he does follow with wound care and podiatry.  He is here because he would like pain medication.  He is a chronic pain in that foot, and that is the issue he does spend time on his feet and it is painful.  He denies any fever chills redness from the area or other acute concerns.  He is followed closely he states by podiatry for this exact same symptomology.  To him his foot does not look any different over the last 2 weeks, is just that there is chronic pain which she does not wish to abide any longer.  Patient also states he is not taking any of his glucose control medications.  Suggest that he has had a wound in this area going back several years.  He does report some noisome chronic discharge.     Past Medical History:  Diagnosis Date  . Arthritis   . BPH (benign prostatic hypertrophy)   . Diabetes mellitus   . Diabetic neuropathy ([place])   . Hypertension   . Metatarsal bone fracture    left 5th toe  . Osteomyelitis of toe of right foot ([place])   . Post-operative infection    and diabetic ulcer left foot  . Sleep apnea     does not wear CPAP  . Wears glasses     Patient Active Problem List   Diagnosis Date Noted  . Cellulitis of left foot 03/05/2017  . Surgery, elective   . Hardware complicating wound infection ([place])   . Diabetes mellitus due to underlying condition, uncontrolled, with diabetic neuropathy ([place])   . Metatarsal stress fracture of  left foot 01/12/2016  . Diabetes mellitus due to underlying condition with diabetic polyneuropathy ([place]) 01/12/2016  . Diabetic infection of left foot ([place]) 01/12/2016  . Diabetic ulcer of foot associated with type 2 diabetes mellitus, with necrosis of muscle ([place]) 01/11/2016    Past Surgical History:  Procedure Laterality Date  . COLONOSCOPY W/ BIOPSIES AND POLYPECTOMY    . I&D EXTREMITY Left 01/11/2016   Procedure: LEFT FOOT IRRIGATION AND DEBRIDEMENT WOUND VAC AND REMOVAL OF HARDWARE;  Surgeon: Wylene Simmer, MD;  Location: [facility];  Service: Orthopedics;  Laterality: Left;  . ORIF TOE FRACTURE Left 06/08/2015   Procedure: OPEN REDUCTION INTERNAL FIXATION (ORIF) LEFT FIFTH METATARSAL BASE FRACTURE NONUNION; CALCANEAL AUTOGRAFT ;  Surgeon: Wylene Simmer, MD;  Location: [facility];  Service: Orthopedics;  Laterality: Left;  . PATELLA RECONSTRUCTION Left 2005  . PROSTATE ABLATION  2014  . TOE AMPUTATION     partial amputation right great toe    Prior to Admission medications   Medication Sig Start Date End Date Taking? Authorizing Provider  aspirin EC 81 MG tablet Take 81 mg by mouth daily.    [provider]  docusate sodium (COLACE) 100 MG capsule Take 1 capsule (100 mg total) by mouth 2 (two) times daily. While taking narcotic pain medicine. 01/12/16  Corky Sing, PA-C  doxycycline (MONODOX) 100 MG capsule Take 1 capsule (100 mg total) by mouth 2 (two) times daily. 08/30/17   Sable Feil, PA-C  FARXIGA 10 MG TABS tablet Take 1 tablet by mouth daily. 03/03/17   [provider]  gabapentin (NEURONTIN) 400 MG capsule Take 400 mg by mouth 2 (two) times daily.    [provider]  insulin aspart (NOVOLOG) 100 UNIT/ML injection Inject 12-20 Units into the skin 3 (three) times daily before meals.    [provider]  Insulin Glargine (BASAGLAR KWIKPEN) 100 UNIT/ML SOPN Inject 0.35 mLs (35 Units total) into the skin at bedtime. 05/21/17   Renato Shin, MD  insulin lispro (HUMALOG KWIKPEN) 100 UNIT/ML KiwkPen Inject 0.15 mLs (15 Units total) into the skin 3 (three) times daily with meals. And pen needles 4/day Patient not taking: Reported on 03/05/2017 04/30/16   Renato Shin, MD  losartan-hydrochlorothiazide (HYZAAR) 100-25 MG tablet Take 1 tablet by mouth daily. 12/24/15   [provider]  metFORMIN (GLUCOPHAGE) 1000 MG tablet Take 1,000 mg by mouth 2 (two) times daily. 10/21/15   [provider]  multivitamin-iron-minerals-folic acid (CENTRUM) chewable tablet Chew 1 tablet by mouth daily.    [provider]  oxyCODONE (OXY IR/ROXICODONE) 5 MG immediate release tablet Take 1 tablet (5 mg total) by mouth every 6 (six) hours as needed for moderate pain. 03/07/17   Vaughan Basta, MD  senna (SENOKOT) 8.6 MG TABS tablet Take 2 tablets (17.2 mg total) by mouth 2 (two) times daily. 01/12/16   Corky Sing, PA-C  simvastatin (ZOCOR) 20 MG tablet TAKE 1 TABLET BY MOUTH IN THE EVENING 05/02/16   Renato Shin, MD  TRADJENTA 5 MG TABS tablet Take 5 mg by mouth daily. 03/01/17   [provider]  traMADol (ULTRAM) 50 MG tablet Take 1 tablet (50 mg total) by mouth every 6 (six) hours as needed. 08/30/17 08/30/18  Sable Feil, PA-C    Allergies Bactrim [sulfamethoxazole-trimethoprim]  Family History  Problem Relation Age of Onset  . Diabetes Mother   . Diabetes Other     Social History Social History   Tobacco Use  . Smoking status: Never Smoker  . Smokeless tobacco: Never Used  Substance Use Topics  . Alcohol use: Yes    Comment: occasional   . Drug use: No    Review of Systems Constitutional: No fever/chills Eyes: No visual changes. ENT: No sore throat. No stiff neck no neck pain Cardiovascular: Denies chest pain. Respiratory: Denies shortness of breath. Gastrointestinal:   no vomiting.  No diarrhea.  No constipation. Genitourinary: Negative for dysuria. Musculoskeletal: Negative lower  extremity swelling Skin: Negative for rash. Neurological: Negative for severe headaches, focal weakness or numbness.   ____________________________________________   PHYSICAL EXAM:  VITAL SIGNS: ED Triage Vitals  Enc Vitals Group     BP 11/05/17 0932 (!) 149/88     Pulse Rate 11/05/17 0932 92     Resp 11/05/17 0932 18     Temp 11/05/17 0932 98.2 F (36.8 C)     Temp Source 11/05/17 0932 Oral     SpO2 11/05/17 0932 98 %     Weight 11/05/17 1051 260 lb (117.9 kg)     Height 11/05/17 1051 5\' 10"  (1.778 m)     Head Circumference --      Peak Flow --      Pain Score 11/05/17 0938 10     Pain Loc --  Pain Edu? --      Excl. in [place]? --     Constitutional: Alert and oriented. Well appearing and in no acute distress. Eyes: Conjunctivae are normal Head: Atraumatic HEENT: No congestion/rhinnorhea. Mucous membranes are moist.  Oropharynx non-erythematous Neck:   Nontender with no meningismus, no masses, no stridor Cardiovascular: Normal rate, regular rhythm. Grossly normal heart sounds.  Good peripheral circulation. Respiratory: Normal respiratory effort.  No retractions. Lungs CTAB. Abdominal: Soft and nontender. No distention. No guarding no rebound Back:  There is no focal tenderness or step off.  there is no midline tenderness there are no lesions noted. there is no CVA tenderness  Musculoskeletal: No lower extremity tenderness, no upper extremity tenderness. No joint effusions, no DVT signs strong distal pulses no edema Neurologic:  Normal speech and language. No gross focal neurologic deficits are appreciated.  Skin:  Skin is warm, dry and intact.  A very superficial ulcer noted to the right foot which is scabbed over and does not appear to be active any significant way, on the left foot there is another ulcer noted around the left tarsal, it is a few centimeters, it looks very chronic, it is dry there is no weeping discharge there is no purulence, there is minimal swelling  around it.  It is minimally tender to touch.  Is not hot to touch there is no lymphogenic spread of infection,. Psychiatric: Mood and affect are normal. Speech and behavior are normal.  ____________________________________________   LABS (all labs ordered are listed, but only abnormal results are displayed)  Labs Reviewed  CBC WITH DIFFERENTIAL/PLATELET - Abnormal; Notable for the following components:      Result Value   Monocytes Absolute 1.1 (*)    All other components within normal limits  BASIC METABOLIC PANEL - Abnormal; Notable for the following components:   Chloride 99 (*)    Glucose, Bld 160 (*)    All other components within normal limits    Pertinent labs  results that were available during my care of the patient were reviewed by me and considered in my medical decision making (see chart for details). ____________________________________________  EKG  I personally interpreted any EKGs ordered by me or triage  ____________________________________________  RADIOLOGY  Pertinent labs & imaging results that were available during my care of the patient were reviewed by me and considered in my medical decision making (see chart for details). If possible, patient and/or family made aware of any abnormal findings.  Dg Foot Complete Left  Result Date: 11/05/2017 CLINICAL DATA:  Left foot pain and swelling with ulcers. EXAM: LEFT FOOT - COMPLETE 3+ VIEW COMPARISON:  Radiographs of August 30, 2017. FINDINGS: There is no evidence of fracture or dislocation. Joint spaces are intact. Mild posterior calcaneal spurring is noted. Soft tissue ulceration is seen near base of fifth metatarsal without underlying lytic destruction is suggest osteomyelitis. IMPRESSION: Soft tissue ulceration seen overlying proximal base of fifth metatarsal without evidence of lytic destruction to suggest underlying osteomyelitis. Electronically Signed   By: Marijo Conception, M.D.   On: 11/05/2017 12:29    ____________________________________________    PROCEDURES  Procedure(s) performed: None  Procedures  Critical Care performed: None  ____________________________________________   INITIAL IMPRESSION / ASSESSMENT AND PLAN / ED COURSE  Pertinent labs & imaging results that were available during my care of the patient were reviewed by me and considered in my medical decision making (see chart for details).  Patient here with chronic foot pain is really  here for pain control.  We did have social work talk to him about his noncompliance which I think is obviously significantly contributing to this pathology.  Do not see any acute pathology on the foot today, we did do basic blood work which is reassuring x-ray shows no evidence of osteo I do not think an MRI is indicated today I do not see any drainage at this time to culture, prior cultures did show sensitive to Augmentin.  I did try calling Dr. Melony Overly, podiatry but they go straight to voicemail and they have not you call me back.  He does have excellent outpatient follow-up.  We will see if we can get him home with pain control and return precautions given and understood  ----------------------------------------- 2:49 PM on 11/05/2017 -----------------------------------------  dw dr Melony Overly, who saw him recently and describes the     ____________________________________________   FINAL CLINICAL IMPRESSION(S) / ED DIAGNOSES  Final diagnoses:  None      This chart was dictated using voice recognition software.  Despite best efforts to proofread,  errors can occur which can change meaning.      Schuyler Amor, MD 11/05/17 1321    Schuyler Amor, MD 11/05/17 [phone]

## 2017-11-05 NOTE — Clinical Social Work Note (Signed)
CSW received consult that patient needs assistance with medications.  CSW informed ED secretary that case management can assist with this, CSW to sign off.  Jones Broom. [place], MSW, [place]  11/05/2017 12:08 PM

## 2017-11-05 NOTE — ED Notes (Signed)
Patient returns after leaving ED at approx. 9 PM last evening.  Left leg visibly edematous.  States he has an open wound on his left foot (wearing shoe and sock).  Declines WC.  Hx of DM, states he has not taken his BS this AM.

## 2017-11-05 NOTE — ED Notes (Signed)
Pt ambulatory to POV with slow, limping gait. VSS. NAD. Discharge instructions, RX and follow up discussed.

## 2017-11-05 NOTE — ED Triage Notes (Signed)
Pt to ED via POV c/o left foot pain and swelling. Pt was in ED last night and had labs done but was unable to stay due to wait time. Pt states that he first noticed the pain and swelling about 2 weeks ago. Pt states that he saw podiatry a few weeks ago and she shaved all the dead skin off of his foot and since then he has been having pain and swelling. Pt also reports odor from foot.   Pt has open wound on the left foot, pt is diabetic. Pt in NAD at this time.

## 2017-11-05 NOTE — Discharge Instructions (Addendum)
If you have increased pain, fever, increased drainage, or any other concerning symptoms especially signs of infection, such as redness spreading from the area, please follow up with the emergency room right away.  Otherwise, please see her podiatrist, and the wound care center in the next day or 2.  Do not drink or drive on the pain medication we are prescribing you.  Follow closely with [facility] care doctor to see about finding other cheaper forms of insulin for you to take.

## 2017-11-05 NOTE — Care Management (Signed)
Patient has insurance with [employer]. There is no medication assistance for patient with insurance. Provided patient with medication management and [facility] application in the event he looses his insurance. Encouraged him to speak with his PCP regarding getting the lowest cost insulins possibles. Explained to patient to potential risk of him not taking his medications as prescribed and even loss of his foot. Patient verbalized understanding.

## 2017-11-07 ENCOUNTER — Other Ambulatory Visit
Admission: RE | Admit: 2017-11-07 | Discharge: 2017-11-07 | Disposition: A | Discharge status: Home / Self Care | Payer: BLUE CROSS/BLUE SHIELD | Source: Ambulatory Visit | Attending: Physician Assistant | Admitting: Physician Assistant

## 2017-11-07 ENCOUNTER — Encounter: Payer: BLUE CROSS/BLUE SHIELD | Attending: Physician Assistant | Admitting: Physician Assistant

## 2017-11-07 DIAGNOSIS — L97522 Non-pressure chronic ulcer of other part of left foot with fat layer exposed: Secondary | ICD-10-CM | POA: Diagnosis not present

## 2017-11-07 DIAGNOSIS — Z89411 Acquired absence of right great toe: Secondary | ICD-10-CM | POA: Insufficient documentation

## 2017-11-07 DIAGNOSIS — E11621 Type 2 diabetes mellitus with foot ulcer: Secondary | ICD-10-CM | POA: Insufficient documentation

## 2017-11-07 DIAGNOSIS — L97512 Non-pressure chronic ulcer of other part of right foot with fat layer exposed: Secondary | ICD-10-CM | POA: Insufficient documentation

## 2017-11-07 DIAGNOSIS — L089 Local infection of the skin and subcutaneous tissue, unspecified: Secondary | ICD-10-CM | POA: Diagnosis present

## 2017-11-07 DIAGNOSIS — I10 Essential (primary) hypertension: Secondary | ICD-10-CM | POA: Diagnosis not present

## 2017-11-07 DIAGNOSIS — E1142 Type 2 diabetes mellitus with diabetic polyneuropathy: Secondary | ICD-10-CM | POA: Insufficient documentation

## 2017-11-07 DIAGNOSIS — Z794 Long term (current) use of insulin: Secondary | ICD-10-CM | POA: Diagnosis not present

## 2017-11-10 LAB — AEROBIC CULTURE W GRAM STAIN (SUPERFICIAL SPECIMEN)

## 2017-11-10 LAB — AEROBIC CULTURE  (SUPERFICIAL SPECIMEN)

## 2017-11-10 NOTE — Progress Notes (Signed)
[name] ([id number]) Visit Report for 11/07/2017 Abuse/Suicide Risk Screen Details Patient Name: [name], [name]. Date of Service: 11/07/2017 12:30 PM Medical Record Number: [id number] Patient Account Number: [id number] Date of Birth/Sex: 18-Sep-1960 (57 y.o. M) Treating RN: Ahmed Prima Primary Care Jakia Kennebrew: Lorelee Market Other Clinician: Referring Dillard Pascal: Referral, Self Treating Ariea Rochin/Extender: STONE III, HOYT Weeks in Treatment: 0 Abuse/Suicide Risk Screen Items Answer ABUSE/SUICIDE RISK SCREEN: Has anyone close to you tried to hurt or harm you recentlyo No Do you feel uncomfortable with anyone in your familyo No Has anyone forced you do things that you didnot want to doo No Do you have any thoughts of harming yourselfo No Patient displays signs or symptoms of abuse and/or neglect. No Electronic Signature(s) Signed: 11/07/2017 4:00:06 PM By: Alric Quan Entered By: Alric Quan on 11/07/2017 12:50:08 [name] ([id number]) -------------------------------------------------------------------------------- Activities of Daily Living Details Patient Name: [name]. Date of Service: 11/07/2017 12:30 PM Medical Record Number: [id number] Patient Account Number: [id number] Date of Birth/Sex: 1960/09/16 (57 y.o. M) Treating RN: Ahmed Prima Primary Care Walther Sanagustin: Lorelee Market Other Clinician: Referring Zaina Jenkin: Referral, Self Treating Dionte Blaustein/Extender: Melburn Hake, HOYT Weeks in Treatment: 0 Activities of Daily Living Items Answer Activities of Daily Living (Please select one for each item) Drive Automobile Completely Able Take Medications Completely Able Use Telephone Completely Able Care for Appearance Completely Able Use Toilet Completely Able Bath / Shower Completely Able Dress Self Completely Able Feed Self Completely Able Walk Completely Able Get In / Out Bed Completely Able Housework Completely Able Prepare Meals  Completely Able Handle Money Completely Able Shop for Self Completely Able Electronic Signature(s) Signed: 11/07/2017 4:00:06 PM By: Alric Quan Entered By: Alric Quan on 11/07/2017 12:50:24 [name] ([id number]) -------------------------------------------------------------------------------- Education Assessment Details Patient Name: [name]. Date of Service: 11/07/2017 12:30 PM Medical Record Number: [id number] Patient Account Number: [id number] Date of Birth/Sex: 1960-08-26 (57 y.o. M) Treating RN: Ahmed Prima Primary Care Rinaldo Macqueen: Lorelee Market Other Clinician: Referring Naidelin Gugliotta: Referral, Self Treating Tad Fancher/Extender: Melburn Hake, HOYT Weeks in Treatment: 0 Primary Learner Assessed: Patient Learning Preferences/Education Level/Primary Language Learning Preference: Explanation, Printed Material Highest Education Level: High School Preferred Language: English Cognitive Barrier Assessment/Beliefs Language Barrier: No Translator Needed: No Memory Deficit: No Emotional Barrier: No Cultural/Religious Beliefs Affecting Medical Care: No Physical Barrier Assessment Impaired Vision: Yes Glasses Impaired Hearing: No Decreased Hand dexterity: No Knowledge/Comprehension Assessment Knowledge Level: Medium Comprehension Level: Medium Ability to understand written Medium instructions: Ability to understand verbal Medium instructions: Motivation Assessment Anxiety Level: Calm Cooperation: Cooperative Education Importance: Acknowledges Need Interest in Health Problems: Asks Questions Perception: Coherent Willingness to Engage in Self- Medium Management Activities: Readiness to Engage in Self- Medium Management Activities: Electronic Signature(s) Signed: 11/07/2017 4:00:06 PM By: Alric Quan Entered By: Alric Quan on 11/07/2017 12:51:10 [name]  ([id number]) -------------------------------------------------------------------------------- Fall Risk Assessment Details Patient Name: [name]. Date of Service: 11/07/2017 12:30 PM Medical Record Number: [id number] Patient Account Number: [id number] Date of Birth/Sex: May 22, 1961 (57 y.o. M) Treating RN: Ahmed Prima Primary Care Akira Perusse: Lorelee Market Other Clinician: Referring Kiva Norland: Referral, Self Treating Eutimio Gharibian/Extender: Melburn Hake, HOYT Weeks in Treatment: 0 Fall Risk Assessment Items Have you had 2 or more falls in the last 12 monthso 0 No Have you had any fall that resulted in injury in the last 12 monthso 0 No FALL RISK ASSESSMENT: History of falling - immediate or within 3 months 0 No Secondary diagnosis 0 No Ambulatory aid None/bed rest/wheelchair/nurse 0 No Crutches/cane/walker 0 No  Furniture 0 No IV Access/Saline Lock 0 No Gait/Training Normal/bed rest/immobile 0 No Weak 0 No Impaired 0 No Mental Status Oriented to own ability 0 Yes Electronic Signature(s) Signed: 11/07/2017 4:00:06 PM By: Alric Quan Entered By: Alric Quan on 11/07/2017 12:51:21 [name] ([id number]) -------------------------------------------------------------------------------- Foot Assessment Details Patient Name: [name]. Date of Service: 11/07/2017 12:30 PM Medical Record Number: [id number] Patient Account Number: [id number] Date of Birth/Sex: 06/20/1961 (57 y.o. M) Treating RN: Ahmed Prima Primary Care Maurilio Puryear: Lorelee Market Other Clinician: Referring Pistol Kessenich: Referral, Self Treating Francoise Chojnowski/Extender: Melburn Hake, HOYT Weeks in Treatment: 0 Foot Assessment Items Site Locations + = Sensation present, - = Sensation absent, C = Callus, U = Ulcer R = Redness, W = Warmth, M = Maceration, PU = Pre-ulcerative lesion F = Fissure, S = Swelling, D = Dryness Assessment Right: Left: Other Deformity: No No Prior Foot Ulcer: No No Prior  Amputation: No No Charcot Joint: No No Ambulatory Status: Ambulatory Without Help Gait: Steady Electronic Signature(s) Signed: 11/07/2017 4:00:06 PM By: Alric Quan Entered By: Alric Quan on 11/07/2017 12:59:06 [name] ([id number]) -------------------------------------------------------------------------------- Nutrition Risk Assessment Details Patient Name: [name]. Date of Service: 11/07/2017 12:30 PM Medical Record Number: [id number] Patient Account Number: [id number] Date of Birth/Sex: 01-20-1961 (57 y.o. M) Treating RN: Ahmed Prima Primary Care Kiron Osmun: Lorelee Market Other Clinician: Referring Keona Bilyeu: Referral, Self Treating Sherrol Vicars/Extender: STONE III, HOYT Weeks in Treatment: 0 Height (in): 70 Weight (lbs): 265 Body Mass Index (BMI): 38 Nutrition Risk Assessment Items NUTRITION RISK SCREEN: I have an illness or condition that made me change the kind and/or amount of 0 No food I eat I eat fewer than two meals per day 0 No I eat few fruits and vegetables, or milk products 0 No I have three or more drinks of beer, liquor or wine almost every day 0 No I have tooth or mouth problems that make it hard for me to eat 0 No I don't always have enough money to buy the food I need 0 No I eat alone most of the time 0 No I take three or more different prescribed or over-the-counter drugs a day 1 Yes Without wanting to, I have lost or gained 10 pounds in the last six months 0 No I am not always physically able to shop, cook and/or feed myself 0 No Nutrition Protocols Good Risk Protocol Moderate Risk Protocol Electronic Signature(s) Signed: 11/07/2017 4:00:06 PM By: Alric Quan Entered By: Alric Quan on 11/07/2017 12:51:27

## 2017-11-10 NOTE — Progress Notes (Signed)
[name] ([id number]) Visit Report for 11/07/2017 Allergy List Details Patient Name: [name], [name]. Date of Service: 11/07/2017 12:30 PM Medical Record Number: [id number] Patient Account Number: [id number] Date of Birth/Sex: 07-04-1960 (57 y.o. M) Treating RN: Ahmed Prima Primary Care Lauramae Kneisley: Lorelee Market Other Clinician: Referring Selso Mannor: Referral, Self Treating Delita Chiquito/Extender: STONE III, HOYT Weeks in Treatment: 0 Allergies Active Allergies Bactrim Allergy Notes Electronic Signature(s) Signed: 11/07/2017 4:00:06 PM By: Alric Quan Entered By: Alric Quan on 11/07/2017 12:45:20 [name] ([id number]) -------------------------------------------------------------------------------- Arrival Information Details Patient Name: [name]. Date of Service: 11/07/2017 12:30 PM Medical Record Number: [id number] Patient Account Number: [id number] Date of Birth/Sex: 03/24/1961 (57 y.o. M) Treating RN: Ahmed Prima Primary Care Darlyne Schmiesing: Lorelee Market Other Clinician: Referring Jannell Franta: Referral, Self Treating Mahiya Kercheval/Extender: Melburn Hake, HOYT Weeks in Treatment: 0 Visit Information Patient Arrived: Ambulatory Arrival Time: 12:42 Accompanied By: self Transfer Assistance: None Patient Identification Verified: Yes Secondary Verification Process Completed: Yes Patient Requires Transmission-Based No Precautions: Patient Has Alerts: Yes Patient Alerts: DM II History Since Last Visit Electronic Signature(s) Signed: 11/07/2017 4:00:06 PM By: Alric Quan Entered By: Alric Quan on 11/07/2017 12:44:10 [name] ([id number]) -------------------------------------------------------------------------------- Clinic Level of Care Assessment Details Patient Name: [name]. Date of Service: 11/07/2017 12:30 PM Medical Record Number: [id number] Patient Account Number: [id number] Date of Birth/Sex: 04/17/1961 (57 y.o.  M) Treating RN: Montey Hora Primary Care Luba Matzen: Lorelee Market Other Clinician: Referring Makeba Delcastillo: Referral, Self Treating Loye Vento/Extender: Melburn Hake, HOYT Weeks in Treatment: 0 Clinic Level of Care Assessment Items TOOL 1 Quantity Score []  - Use when EandM and Procedure is performed on INITIAL visit 0 ASSESSMENTS - Nursing Assessment / Reassessment X - General Physical Exam (combine w/ comprehensive assessment (listed just below) when 1 20 performed on new pt. evals) X- 1 25 Comprehensive Assessment (HX, ROS, Risk Assessments, Wounds Hx, etc.) ASSESSMENTS - Wound and Skin Assessment / Reassessment []  - Dermatologic / Skin Assessment (not related to wound area) 0 ASSESSMENTS - Ostomy and/or Continence Assessment and Care []  - Incontinence Assessment and Management 0 []  - 0 Ostomy Care Assessment and Management (repouching, etc.) PROCESS - Coordination of Care X - Simple Patient / Family Education for ongoing care 1 15 []  - 0 Complex (extensive) Patient / Family Education for ongoing care X- 1 10 Staff obtains Programmer, systems, Records, Test Results / Process Orders []  - 0 Staff telephones HHA, Nursing Homes / Clarify orders / etc []  - 0 Routine Transfer to another Facility (non-emergent condition) []  - 0 Routine Hospital Admission (non-emergent condition) X- 1 15 New Admissions / Biomedical engineer / Ordering NPWT, Apligraf, etc. []  - 0 Emergency Hospital Admission (emergent condition) PROCESS - Special Needs []  - Pediatric / Minor Patient Management 0 []  - 0 Isolation Patient Management []  - 0 Hearing / Language / Visual special needs []  - 0 Assessment of Community assistance (transportation, D/C planning, etc.) []  - 0 Additional assistance / Altered mentation []  - 0 Support Surface(s) Assessment (bed, cushion, seat, etc.) [name]. ([id number]) INTERVENTIONS - Miscellaneous []  - External ear exam 0 []  - 0 Patient Transfer (multiple staff / Librarian, academic / Similar devices) []  - 0 Simple Staple / Suture removal (25 or less) []  - 0 Complex Staple / Suture removal (26 or more) []  - 0 Hypo/Hyperglycemic Management (do not check if billed separately) X- 1 15 Ankle / Brachial Index (ABI) - do not check if billed separately Has the patient been seen at the hospital within the  last three years: Yes Total Score: 100 Level Of Care: New/Established - Level 3 Electronic Signature(s) Signed: 11/07/2017 4:42:41 PM By: Montey Hora Entered By: Montey Hora on 11/07/2017 13:37:46 [name] ([id number]) -------------------------------------------------------------------------------- Encounter Discharge Information Details Patient Name: [name], [name]. Date of Service: 11/07/2017 12:30 PM Medical Record Number: [id number] Patient Account Number: [id number] Date of Birth/Sex: 09-19-1960 (57 y.o. M) Treating RN: Roger Shelter Primary Care Edyth Glomb: Lorelee Market Other Clinician: Referring Maleka [name]: Referral, Self Treating Gaynelle Pastrana/Extender: Melburn Hake, HOYT Weeks in Treatment: 0 Encounter Discharge Information Items Discharge Condition: Stable Ambulatory Status: Ambulatory Discharge Destination: Home Transportation: Private Auto Schedule Follow-up Appointment: Yes Clinical Summary of Care: Electronic Signature(s) Signed: 11/07/2017 3:34:23 PM By: Roger Shelter Entered By: Roger Shelter on 11/07/2017 14:07:05 [name] ([id number]) -------------------------------------------------------------------------------- Lower Extremity Assessment Details Patient Name: [name]. Date of Service: 11/07/2017 12:30 PM Medical Record Number: [id number] Patient Account Number: [id number] Date of Birth/Sex: Nov 13, 1960 (57 y.o. M) Treating RN: Ahmed Prima Primary Care Fardowsa Authier: Lorelee Market Other Clinician: Referring Karalina Tift: Referral, Self Treating Kiano Terrien/Extender: Melburn Hake, HOYT Weeks in Treatment:  0 Edema Assessment Assessed: [Left: No] [Right: No] [Left: Edema] [Right: :] Calf Left: Right: Point of Measurement: 36 cm From Medial Instep 35.5 cm 38.5 cm Ankle Left: Right: Point of Measurement: 11 cm From Medial Instep 24.7 cm 23 cm Vascular Assessment Pulses: Dorsalis Pedis Palpable: [Left:No] [Right:No] Posterior Tibial Palpable: [Left:Yes] [Right:Yes] Doppler Audible: [Left:Yes] [Right:Yes] Popliteal Palpable: [Left:Yes] [Right:Yes] Extremity colors, hair growth, and conditions: Extremity Color: [Left:Hyperpigmented] [Right:Hyperpigmented] Hair Growth on Extremity: [Left:Yes] [Right:Yes] Temperature of Extremity: [Left:Warm] [Right:Warm] Capillary Refill: [Left:< 3 seconds] [Right:< 3 seconds] Blood Pressure: Brachial: [Left:127] [Right:127] Dorsalis Pedis: 160 [Left:Dorsalis Pedis: 158] Ankle: Posterior Tibial: 130 [Left:Posterior Tibial: 126 1.26] [Right:1.24] Toe Nail Assessment Left: Right: Thick: Yes Yes Discolored: Yes Yes Deformed: Yes Yes Improper Length and Hygiene: No No Electronic Signature(s) Signed: 11/07/2017 4:00:06 PM By: Alric Quan Entered By: Alric Quan on 11/07/2017 13:08:30 [name] ([id number]) Kipp Brood, [name] ([id number]) -------------------------------------------------------------------------------- Multi Wound Chart Details Patient Name: [name]. Date of Service: 11/07/2017 12:30 PM Medical Record Number: [id number] Patient Account Number: [id number] Date of Birth/Sex: 1961/02/25 (57 y.o. M) Treating RN: Montey Hora Primary Care Thadius Smisek: Lorelee Market Other Clinician: Referring Maanav Kassabian: Referral, Self Treating Rhianne Soman/Extender: Melburn Hake, HOYT Weeks in Treatment: 0 Photos: [12:No Photos] [13:No Photos] [N/A:N/A] Wound Location: [12:Left Foot - Lateral] [13:Right Metatarsal head first - Plantar] [N/A:N/A] Wounding Event: [12:Gradually Appeared] [13:Gradually Appeared] [N/A:N/A] Primary Etiology:  [12:Diabetic Wound/Ulcer of the Lower Extremity] [13:Diabetic Wound/Ulcer of the Lower Extremity] [N/A:N/A] Comorbid History: [12:Hypertension, Type II Diabetes, Neuropathy] [13:Hypertension, Type II Diabetes, Neuropathy] [N/A:N/A] Date Acquired: [12:10/24/2017] [13:10/24/2017] [N/A:N/A] Weeks of Treatment: [12:0] [13:0] [N/A:N/A] Wound Status: [12:Open] [13:Open] [N/A:N/A] Measurements L x W x D [12:1.5x0.8x0.2] [13:0.3x0.2x0.3] [N/A:N/A] (cm) Area (cm) : [12:0.942] [13:0.047] [N/A:N/A] Volume (cm) : [12:0.188] [13:0.014] [N/A:N/A] Starting Position 1 [12:12] [13:12] (o'clock): Ending Position 1 [12:12] [13:12] (o'clock): Maximum Distance 1 (cm): [12:0.9] [13:0.3] Undermining: [12:Yes] [13:Yes] [N/A:N/A] Classification: [12:Grade 1] [13:Grade 1] [N/A:N/A] Exudate Amount: [12:Large] [13:Large] [N/A:N/A] Exudate Type: [12:Serosanguineous] [13:Serous] [N/A:N/A] Exudate Color: [12:red, brown] [13:amber] [N/A:N/A] Wound Margin: [12:Distinct, outline attached] [13:Distinct, outline attached] [N/A:N/A] Granulation Amount: [12:Medium (34-66%)] [13:None Present (0%)] [N/A:N/A] Granulation Quality: [12:Red] [13:N/A] [N/A:N/A] Necrotic Amount: [12:Medium (34-66%)] [13:Large (67-100%)] [N/A:N/A] Necrotic Tissue: [12:Eschar, Adherent Slough] [13:Eschar, Adherent Slough] [N/A:N/A] Exposed Structures: [12:Fascia: No Fat Layer (Subcutaneous Tissue) Exposed: No Tendon: No Muscle: No Joint: No Bone: No] [13:Fascia: No Fat Layer (Subcutaneous Tissue) Exposed: No  Tendon: No Muscle: No Joint: No Bone: No] [N/A:N/A] Epithelialization: [12:None] [13:None] [N/A:N/A] Periwound Skin Texture: [12:Callus: Yes] [13:Callus: Yes] [N/A:N/A] Periwound Skin Moisture: [12:Maceration: No] [13:No Abnormalities Noted] [N/A:N/A] Periwound Skin Color: [12:No Abnormalities Noted] [13:No Abnormalities Noted] [N/A:N/A] Temperature: [12:No Abnormality] [13:No Abnormality] [N/A:N/A] Tenderness on Palpation: [12:Yes]  [13:Yes] [N/A:N/A] Wound Preparation: [12:Ulcer Cleansing: Rinsed/Irrigated with Saline] [13:Ulcer Cleansing: Rinsed/Irrigated with Saline] [N/A:N/A] Topical Anesthetic Applied: Topical Anesthetic Applied: Other: lidocaine 4% Other: lidocaine 4% Treatment Notes Electronic Signature(s) Signed: 11/07/2017 4:42:41 PM By: Montey Hora Entered By: Montey Hora on 11/07/2017 13:32:59 [name] ([id number]) -------------------------------------------------------------------------------- [place] Details Patient Name: [name], [name]. Date of Service: 11/07/2017 12:30 PM Medical Record Number: [id number] Patient Account Number: [id number] Date of Birth/Sex: 02/27/1961 (57 y.o. M) Treating RN: Montey Hora Primary Care Tyronda Vizcarrondo: Lorelee Market Other Clinician: Referring Jaxin Fulfer: Referral, Self Treating Shakirah Kirkey/Extender: Melburn Hake, HOYT Weeks in Treatment: 0 Active Inactive ` Abuse / Safety / Falls / Self Care Management Nursing Diagnoses: Impaired physical mobility Goals: Patient will remain injury free related to falls Date Initiated: 11/07/2017 Target Resolution Date: 02/06/2018 Goal Status: Active Interventions: Assess fall risk on admission and as needed Notes: ` Orientation to the Wound Care Program Nursing Diagnoses: Knowledge deficit related to the wound healing center program Goals: Patient/caregiver will verbalize understanding of the [place] Date Initiated: 11/07/2017 Target Resolution Date: 02/07/2018 Goal Status: Active Interventions: Provide education on orientation to the wound center Notes: ` Wound/Skin Impairment Nursing Diagnoses: Impaired tissue integrity Goals: Ulcer/skin breakdown will heal within 14 weeks Date Initiated: 11/07/2017 Target Resolution Date: 02/06/2018 Goal Status: Active Interventions: DQUAN, CORTOPASSI ([id number]) Assess patient/caregiver ability to obtain necessary supplies Assess  patient/caregiver ability to perform ulcer/skin care regimen upon admission and as needed Assess ulceration(s) every visit Notes: Electronic Signature(s) Signed: 11/07/2017 4:42:41 PM By: Montey Hora Entered By: Montey Hora on 11/07/2017 13:32:43 [name] ([id number]) -------------------------------------------------------------------------------- Pain Assessment Details Patient Name: [name]. Date of Service: 11/07/2017 12:30 PM Medical Record Number: [id number] Patient Account Number: [id number] Date of Birth/Sex: [birth year] (57 y.o. M) Treating RN: Ahmed Prima Primary Care Balen Woolum: Lorelee Market Other Clinician: Referring Kasra Melvin: Referral, Self Treating Lakea Mittelman/Extender: Melburn Hake, HOYT Weeks in Treatment: 0 Active Problems Location of Pain Severity and Description of Pain Patient Has Paino Yes Site Locations Pain Location: Pain in Ulcers Rate the pain. Current Pain Level: 10 Character of Pain Describe the Pain: Aching, Burning Pain Management and Medication Current Pain Management: Goals for Pain Management Topical or injectable lidocaine is offered to patient for acute pain when surgical debridement is performed. If needed, Patient is instructed to use over the counter pain medication for the following 24-48 hours after debridement. Wound care MDs do not prescribed pain medications. Patient has chronic pain or uncontrolled pain. Patient has been instructed to make an appointment with their Primary Care Physician for pain management. Electronic Signature(s) Signed: 11/07/2017 4:00:06 PM By: Alric Quan Entered By: Alric Quan on 11/07/2017 12:44:51 [name] ([id number]) -------------------------------------------------------------------------------- Patient/Caregiver Education Details Patient Name: [name]. Date of Service: 11/07/2017 12:30 PM Medical Record Number: [id number] Patient Account Number: [id number] Date  of Birth/Gender: Jul 02, 1960 (57 y.o. M) Treating RN: Roger Shelter Primary Care Physician: Lorelee Market Other Clinician: Referring Physician: Referral, Self Treating Physician/Extender: Sharalyn Ink in Treatment: 0 Education Assessment Education Provided To: Patient Education Topics Provided Welcome To [facility] [place]: Handouts: Welcome To [facility] [place] Methods: Explain/Verbal Responses: State content correctly Wound Debridement: Handouts:  Wound Debridement Methods: Explain/Verbal Responses: State content correctly Wound/Skin Impairment: Methods: Explain/Verbal Responses: State content correctly Electronic Signature(s) Signed: 11/07/2017 3:34:23 PM By: Roger Shelter Entered By: Roger Shelter on 11/07/2017 14:07:29 [name] ([id number]) -------------------------------------------------------------------------------- Wound Assessment Details Patient Name: [name]. Date of Service: 11/07/2017 12:30 PM Medical Record Number: [id number] Patient Account Number: [id number] Date of Birth/Sex: December 06, 1960 (57 y.o. M) Treating RN: Ahmed Prima Primary Care Marcea Rojek: Lorelee Market Other Clinician: Referring Colbe Viviano: Referral, Self Treating Alphonso Gregson/Extender: STONE III, HOYT Weeks in Treatment: 0 Wound Status Wound Number: 12 Primary Etiology: Diabetic Wound/Ulcer of the Lower Extremity Wound Location: Left Foot - Lateral Wound Status: Open Wounding Event: Gradually Appeared Comorbid Hypertension, Type II Diabetes, Date Acquired: 10/24/2017 History: Neuropathy Weeks Of Treatment: 0 Clustered Wound: No Wound Measurements Length: (cm) 1.5 Width: (cm) 0.8 Depth: (cm) 0.2 Area: (cm) 0.942 Volume: (cm) 0.188 % Reduction in Area: % Reduction in Volume: Epithelialization: None Tunneling: No Undermining: Yes Starting Position (o'clock): 12 Ending Position (o'clock): 12 Maximum Distance: (cm) 0.9 Wound  Description Classification: Grade 1 Wound Margin: Distinct, outline attached Exudate Amount: Large Exudate Type: Serosanguineous Exudate Color: red, brown Foul Odor After Cleansing: No Slough/Fibrino Yes Wound Bed Granulation Amount: Medium (34-66%) Exposed Structure Granulation Quality: Red Fascia Exposed: No Necrotic Amount: Medium (34-66%) Fat Layer (Subcutaneous Tissue) Exposed: No Necrotic Quality: Eschar, Adherent Slough Tendon Exposed: No Muscle Exposed: No Joint Exposed: No Bone Exposed: No Periwound Skin Texture Texture Color No Abnormalities Noted: No No Abnormalities Noted: No Callus: Yes Temperature / Pain Moisture Temperature: No Abnormality No Abnormalities Noted: No Tenderness on Palpation: Yes Maceration: No Wound Preparation Ulcer Cleansing: Rinsed/Irrigated with Saline JAMISON, SOWARD ([id number]) Topical Anesthetic Applied: Other: lidocaine 4%, Treatment Notes Wound #12 (Left, Lateral Foot) 1. Cleansed with: Clean wound with Normal Saline 4. Dressing Applied: Other dressing (specify in notes) 5. Secondary Dressing Applied ABD Pad Foam Notes kerlix and coban Electronic Signature(s) Signed: 11/07/2017 4:00:06 PM By: Alric Quan Entered By: Alric Quan on 11/07/2017 13:14:35 [name] ([id number]) -------------------------------------------------------------------------------- Wound Assessment Details Patient Name: [name]. Date of Service: 11/07/2017 12:30 PM Medical Record Number: [id number] Patient Account Number: [id number] Date of Birth/Sex: 07-Jan-1961 (57 y.o. M) Treating RN: Ahmed Prima Primary Care Williamson Cavanah: Lorelee Market Other Clinician: Referring Glendale Wherry: Referral, Self Treating Jadavion Spoelstra/Extender: STONE III, HOYT Weeks in Treatment: 0 Wound Status Wound Number: 13 Primary Etiology: Diabetic Wound/Ulcer of the Lower Extremity Wound Location: Right Metatarsal head first - Plantar Wound Status:  Open Wounding Event: Gradually Appeared Comorbid Hypertension, Type II Diabetes, Date Acquired: 10/24/2017 History: Neuropathy Weeks Of Treatment: 0 Clustered Wound: No Wound Measurements Length: (cm) 0.3 Width: (cm) 0.2 Depth: (cm) 0.3 Area: (cm) 0.047 Volume: (cm) 0.014 % Reduction in Area: % Reduction in Volume: Epithelialization: None Tunneling: No Undermining: Yes Starting Position (o'clock): 12 Ending Position (o'clock): 12 Maximum Distance: (cm) 0.3 Wound Description Classification: Grade 1 Wound Margin: Distinct, outline attached Exudate Amount: Large Exudate Type: Serous Exudate Color: amber Wound Bed Granulation Amount: None Present (0%) Exposed Structure Necrotic Amount: Large (67-100%) Fascia Exposed: No Necrotic Quality: Eschar, Adherent Slough Fat Layer (Subcutaneous Tissue) Exposed: No Tendon Exposed: No Muscle Exposed: No Joint Exposed: No Bone Exposed: No Periwound Skin Texture Texture Color No Abnormalities Noted: No No Abnormalities Noted: No Callus: Yes Temperature / Pain Moisture Temperature: No Abnormality No Abnormalities Noted: No Tenderness on Palpation: Yes Wound Preparation Ulcer Cleansing: Rinsed/Irrigated with Saline Topical Anesthetic Applied: Other: lidocaine 4%, KRISHNA, DANCEL ([id number]) Treatment Notes Wound #13 (  Right, Plantar Metatarsal head first) 1. Cleansed with: Clean wound with Normal Saline 2. Anesthetic Topical Lidocaine 4% cream to wound bed prior to debridement 4. Dressing Applied: Prisma Ag 5. Secondary Dressing Applied ABD Pad Foam Notes kerlix and coban Electronic Signature(s) Signed: 11/07/2017 4:00:06 PM By: Alric Quan Entered By: Alric Quan on 11/07/2017 13:17:47 [name] ([id number]) -------------------------------------------------------------------------------- [place] Details Patient Name: [name]. Date of Service: 11/07/2017 12:30 PM Medical Record Number:  [id number] Patient Account Number: [id number] Date of Birth/Sex: 12-24-1960 (57 y.o. M) Treating RN: Ahmed Prima Primary Care Kazue Cerro: Lorelee Market Other Clinician: Referring Cartel Mauss: Referral, Self Treating Shaynna Husby/Extender: STONE III, HOYT Weeks in Treatment: 0 Vital Signs Time Taken: 12:45 Reference Range: 80 - 120 mg / dl Height (in): 70 Source: Stated Weight (lbs): 265 Source: Stated Body Mass Index (BMI): 38 Electronic Signature(s) Signed: 11/07/2017 4:00:06 PM By: Alric Quan Entered By: Alric Quan on 11/07/2017 12:45:13

## 2017-11-10 NOTE — Progress Notes (Signed)
JAMAIR, CATO ([id number]) Visit Report for 11/07/2017 Chief Complaint Document Details Patient Name: [name], [name]. Date of Service: 11/07/2017 12:30 PM Medical Record Number: [id number] Patient Account Number: [id number] Date of Birth/Sex: [birth year] (57 y.o. M) Treating RN: Montey Hora Primary Care Provider: Lorelee Market Other Clinician: Referring Provider: Referral, Self Treating Provider/Extender: Melburn Hake, HOYT Weeks in Treatment: 0 Information Obtained from: Patient Chief Complaint Bilateral foot ulcers Electronic Signature(s) Signed: 11/08/2017 10:27:37 AM By: Worthy Keeler PA-C Entered By: Worthy Keeler on 11/07/2017 16:50:54 [name] ([id number]) -------------------------------------------------------------------------------- Debridement Details Patient Name: [name]. Date of Service: 11/07/2017 12:30 PM Medical Record Number: [id number] Patient Account Number: [id number] Date of Birth/Sex: Oct 19, 1960 (57 y.o. M) Treating RN: Montey Hora Primary Care Provider: Lorelee Market Other Clinician: Referring Provider: Referral, Self Treating Provider/Extender: Melburn Hake, HOYT Weeks in Treatment: 0 Debridement Performed for Wound #13 Right,Plantar Metatarsal head first Assessment: Performed By: Physician STONE III, HOYT E., PA-C Debridement Type: Debridement Severity of Tissue Pre Fat layer exposed Debridement: Pre-procedure Verification/Time Yes - 13:38 Out Taken: Start Time: 13:38 Pain Control: Lidocaine 4% Topical Solution Total Area Debrided (L x W): 0.3 (cm) x 0.2 (cm) = 0.06 (cm) Tissue and other material Viable, Non-Viable, Callus, Slough, Subcutaneous, Slough debrided: Level: Skin/Subcutaneous Tissue Debridement Description: Excisional Instrument: Curette Bleeding: Minimum Hemostasis Achieved: Pressure End Time: 13:42 Procedural Pain: 0 Post Procedural Pain: 0 Response to Treatment: Procedure was tolerated  well Level of Consciousness: Awake and Alert Post Procedure Vitals: Temperature: 98.1 Pulse: 86 Respiratory Rate: 18 Blood Pressure: Systolic Blood Pressure: 381 Diastolic Blood Pressure: 75 Post Debridement Measurements of Total Wound Length: (cm) 0.4 Width: (cm) 0.6 Depth: (cm) 0.1 Volume: (cm) 0.019 Character of Wound/Ulcer Post Debridement: Improved Severity of Tissue Post Debridement: Fat layer exposed Post Procedure Diagnosis Same as Pre-procedure Electronic Signature(s) Signed: 11/07/2017 4:42:41 PM By: Montey Hora Signed: 11/08/2017 10:27:37 AM By: Vic Blackbird, [name] ([id number]) Entered By: Montey Hora on 11/07/2017 13:45:14 [name] ([id number]) -------------------------------------------------------------------------------- HPI Details Patient Name: [name], [name]. Date of Service: 11/07/2017 12:30 PM Medical Record Number: [id number] Patient Account Number: [id number] Date of Birth/Sex: 10-29-1960 (57 y.o. M) Treating RN: Montey Hora Primary Care Provider: Lorelee Market Other Clinician: Referring Provider: Referral, Self Treating Provider/Extender: Melburn Hake, HOYT Weeks in Treatment: 0 History of Present Illness HPI Description: Pleasant 57 year old with history of diabetes (Hgb A1c 10.8 in 2014) and peripheral neuropathy. No PVD. L ABI 1.1. Status post right great toe partial amputation years ago. He was at work and on 10/22/2014, was injured by a cart, and suffered an ulceration to his left anterior calf. He says that it subsequently became infected, and he was treated with a course of antibiotics. He was found on initial exam to have an ulceration on the dorsum of his left third toe. He was unaware of this and attributes it to pressure from his steel toed boots. More recently he injured his right anterior calf on a cart. Ambulating normally per his baseline. He has been undergoing regular debridements, applying mupirocin  cream, and an Ace wrap for edema control. He returns to clinic for follow-up and is without complaints. No pain. No fever or chills. No drainage. 10/25/15; this is a 57 year old man who has type II diabetes with diabetic polyneuropathy. He tells me that he fractured his left fifth metatarsal in June 2016 when he presented with swelling. He does not recall a specific injury. His hemoglobin A1c was apparently too high at  the time for consideration of surgery and he was put in some form of offloading. Ultimately he went to surgery in December with an allograft from his calcaneus to this site, plate and screws. He had an x-ray of the foot in March that showed concerns about nonunion. He tells me that in March he had to move and basically moved himself. He was on his foot a lot and then noticed some drainage from an open area. He has been following with his orthopedic surgeon Dr. Doran Durand. He has been applying a felt donut, dry dressing and using his heel healing sandal. 11/01/15; this is a patient I saw last week for the first time. He had a small open wound on the plantar aspect of his left foot at roughly the level of the base of his fifth metatarsal. He had a considerable degree of thickened skin around this wound on the plantar aspect which I thought was from chronic pressure on this area. He tells [place] that he had drainage over the course of the week. No systemic symptoms. 11/08/15; culture last week grew Citrobacter korseri. This should've been sensitive to the Augmentin I gave him. He has seen Dr. Doran Durand who did his initial surgery and according the patient the plan is to give this another month and then the hardware might need to come out of this. This seems like a reasonable plan. I will adjust his antibiotics to ciprofloxacin which probably should continue for at least another 2 weeks. I gave him 10 days worth today 11/22/15 the patient has completed antibiotics. He has an appointment with Dr. Doran Durand  this Friday. There is improved dimensions around the wound on the left fifth metatarsal base 11/29/15; the patient has completed antibiotics last week. Apparently his appointment with Dr. Doran Durand it is not until this Friday. Dimensions are roughly the same. 12/06/15; saw Dr. Doran Durand. No x-ray told the end of the month, next appointment June 30. We have been using Aquacel Ag 12/13/15: No major change this week. Using Aquacel AG 621/17; arrived this week with maceration around the wound. There was quite a bit of undermining which required surgical debridement. I changed him to [facility] last week, by the patient's admission he was up on this more this week 12/27/15; macerated tissue around the wound is removed with a scalpel and pickups. There is no undermining. Nonviable subcutaneous tissue and skin taken from the superior circumference of the wound is slough from the surface. READMISSION 03/06/16 since I last saw this patient at the end of June, he went for surgery on 01/11/16 by Dr. Doran Durand of orthopedics. He had a left foot irrigation and debridement, removal of hardware and placement of wound VAC. He is also been followed by Dr. Megan Salon of infectious disease and completed a six-week course of IV Rocephin for group B strep and Enterobacter in the bone at the time of surgery. Apparently at the time of surgery the bone looked healthy so I don't think any bone was actually removed. He has been using silver alginate based dressings on the same wound area at the base of the left fifth metatarsal on the left. I note that he is also had arterial studies on 01/08/16, these showed a left ABI of 1.2 to and a right ABI of 1.3. Waveforms were listed as biphasic. He was not felt to have any specific arterial issues. 03/13/16; no real change in the condition of the wound at the left lateral foot at roughly the base of his fifth  metatarsal. Use silver alginate last week. IVORY, BAIL ([id number]) 03/18/16 arrives  today with no open area. Being suspicious of the overlying callus I. Some of this back although I see nothing but covering tissue here/epithelium. There is no surrounding tenderness READMISSION 04/16/16 this is a patient I discharged about a month ago. Initially a surgical wound on the lateral aspect of his left foot which subsequently became infected. The story he is giving today that he went back into his own modified shoe started to notice pain 2 weeks ago he was seen in Dr. Nona Dell office by a physician assistant last Wednesday and according the patient was told that everything looked fine however this is clearly now broken open and he has an open wound in the same spot that we have been dealing with repetitively. Situation is complicated by the fact that he is running short of money on long-term disability. He has not taken his insulin and at least 2 weeks was previously on NovoLog short acting insulin on a sliding scale and TRESIBA 35 units at bedtime. He is no longer able to afford any of his medication he was in the x-ray on 10/15. A plain x-ray showed healed fracture of the left fifth metatarsal bone status post removal of the associated plate and screw fixation hardware. There was no acute appearing osseous abnormality. His blood work showed a white count of 9.2 with an essentially normal differential comprehensive metabolic panel was normal. Previous CT scan of the foot on 01/08/16 showed no osteomyelitis previous vascular workup showed no evidence of significant PAD on 01/13/16 04/23/16; culture I did last week grew enterococcus [ampicillin sensitive] and MRSA. He saw infectious disease yesterday. They stop the clindamycin and ordered an MRI. This is not unreasonable. All the hardware is out of the foot at this point. 04/30/16 at this point in time we are still awaiting the results of the MRI at this point in time. Patient did have an area which appears to be somewhat macerated in the  proximal portion of the wound where there is overlying necrotic skin that is doing nothing more than trapping fluid underneath. He continues to state that he does have some discomfort and again the concern is for the possibility of osteomyelitis hence the reason for the MRI order. We have been using a silver alginate dressing but again I think the reason this with his macerated as it was is that the dressing obscene could not reach the entirety of the wound due to some necrotic skin covering the proximal portion. No bleeding noted at this point in time on initial evaluation. 05/07/16 we receive the results of patient's MRI which shows that he has no evidence of osteomyelitis. This obviously is excellent news. Patient was definitely happy to hear this. He tells me the wound appears to be doing very well at this point in time and is pleased with the progress currently. 05/15/16; as noted the patient did not have osteomyelitis. He has been released by infectious disease and orthopedics. His wound is still open he had a debridement last week but complain when he got home he "bleeding for half a day". He is not had any pain. We have been using silver alginate with Kerlix gauze wrap. 05/28/16; patient arrives today with the wound in much the same condition as last time. He has a small opening on the lateral aspect of his foot however with debridement there is clear undermining medially there is no real evidence of infection here and  I didn't see any point in culturing this. One would have to wonder if this isn't a simple matter of in adequate offloading as he is been using a healing sandal. 06/05/16; the open area is now on the plantar aspect of his foot and not decide. The wound almost appears to have "migrated". This was the term use by our intake nurse. 06/12/16; open area on the plantar aspect of his foot. Base of the wound looks very healthy. This will be his second week in a total contact  cast 06/19/16; patient arrived today in a total contact cast. There was some expectation from our staff and myself that this area would be healed. Unfortunately the area was boggy and with rec pressure a fairly substantial amount of purulent drainage was obtained. Specimen obtained for culture. The patient had no complaints of systemic problems including fever or chills or instability of his diabetes. There was no pain in the foot. Nevertheless a extensive debridement was required. 06/26/16; patient's culture from the abscess last week grew a combination of MRSA and ampicillin sensitive enterococcus. I had him on Augmentin and Septra however I have elected to give him a full 10 day course of Zyvox instead as I Recently treated this combination of organisms with Augmentin and Septra before. Arrives today with no systemic symptoms 07/03/16; the patient has 2 more treatments of Zyvox and then he is finished antibiotics the wound has improved now mostly on the lateral aspect of his foot. There is still some tenderness when he walks. 07/10/16; patient arrives today with Zyvox completed. He only has a small open area remaining. 07/24/16; she arrives here with no overt open area. The covering is thick callus/eschar. Nevertheless there is no open area here. He has some tenderness underneath the area but no overt infection is observed no drainage. The patient has a deformity in the foot with this area weekly to be exposed to more pressure in his foot where nevertheless that something we are going to have to deal with going forward. The patient has diabetic workboots and diabetic shoes. He has had a long difficult course with the area here. This started as a fracture at work. He had bone grafting from his calcaneus and screws. This got infected he had to have more surgery on the area. Bone at the time of that surgery I think showed enterococcus and group B strep. He had 6 weeks of Rocephin. Since then the area  has waxed and waned in its difficulty. Recently he had an MRI in December that did not show osteomyelitis nevertheless he had an abscess that grew MRSA and enterococcus which I elected to treat with Zyvox. This was in December and this wound is actually "healed" over [name], [name] ([id number]) 08/21/16; the patient came in today for his one-month follow-up visit. The area on the lateral aspect of his left foot looks much the same as some month ago. There is no evidence of an open wound here. However the patient tells me about a week after he went back to work he developed severe pain and swelling in the plantar aspect of his right foot first and fifth metatarsal heads. 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 He went to see his podiatrist at [facility]. She told him that he could not work on his feet. She told him to go back and his cam walking boot on the left.  Not have open wounds obviously on the right. The patient is actually gone ahead and retired from his job because he does not feel he can work on his feet 09/18/16; patient comes back in saying he recently had some pain on the lateral aspect of his left foot. Asked that we look at this. Other than that all of his wounds have a viable surface. He has diabetic shoes. He is retired from work. READMISSION Mardell Cragg is a 57 year old man who we cared for for a prolonged period of time in late 2017 into March 2018. At that point he had suffered a fracture of his left fifth metatarsal at about the level of the base of the fifth metatarsal. He had surgical repair by Dr. Meryl Dare and he developed a very refractory wound over the fifth metatarsal. Ultimately this became infected he required hardware removal this eventually closed over and we discharged from the clinic in March of this year. He tells me as well until roughly 3 weeks ago when he developed  increasing pain in the same area making it difficult for him to sleep. He was admitted to hospital from 9/5 through 9/7 with now an ulcer in the same area on the lateral aspect of his left foot at the level of the base of the metatarsal. X-rays and MRIs during this hospitalization were negative. Wound culture showed a combination of Escherichia coli, Morganella and Pseudomonas. He was given IV Cipro and vancomycin the hospital and then discharged home on Cipro and Doxy. He returned to the ER on 9/16 with leg swelling and discomfort. I don't think anything was added at that point although he did have an ultrasound of the left leg that was negative for DVT. He was back in the ER on 10/4 again with pain in the area he was given Bactrim but states he had a significant allergic reaction to that which has abated once he stopped the Bactrim. I don't think he had a full course. He went to see his podiatrist yesterday who recommended some form of foot soak. He has a Darco forefoot offloading boot The patient is a type II diabetic on insulin. Tells me his recent hemoglobin A1c was 7. Arterial studies done in July 2017 showed an ABI in the right of 1.3 on the left 1.2 to biphasic waveforms bilaterally. He was not felt to have arterial disease. As noted the patient has had 2 MRIs most recently on 10/4 this showed no evidence of an abscess or osteomyelitis and no change since the prior study of 03/05/17 there was nonspecific edema on the dorsum of the foot. ABIs in this clinic today 1.2 which would be unchanged from his study done in July/17 04/15/17; now down to 1 small wound on the left lateral foot. We wrapped him and applied collagen last week and the foot is fairly macerated. 04/22/17; small wound on the left lateral foot however it has some undermining. Using collagen 04/29/17; small wound on the left lateral foot some surrounding callus. Chronic damage in this area as a result of initial fracture  nonhealing and secondary infection. 05/06/17; the patient arrives today with the area on the left lateral foot closed. There is thick subcutaneous tissue with a layer of callus. I took some of the callus off just to ensure adequate closure of the underlying tissue and there is no open wound here. He has considerable deformity of this area of his foot however and unless there is an ability to offload this I  think opening is going to be recurrent. He tells me he has diabetic foot wear although I have not actually seen this Readmission: 09/02/17 on evaluation today patient appears to be doing a little better compared to what he tells me what's going on in regard to his left lateral foot. He was previously seen in the ER this past Saturday it is now Tuesday. On Saturday he actually was treated with antibiotics including Cipro 500 mg two times a day and doxycycline 100 mg two times a day. Subsequently he also did have an x-ray performed of his left foot which revealed increased degenerative changes of the base of the fifth metatarsal since comparison in 2018. There was no radiographic evidence of osteomyelitis however. Patient has previously had an MRI of the left foot which was performed on 04/03/17 in this revealed at that point a soft tissue ulceration but no evidence of osteomyelitis. Patient has previously undergone testing in regard to his ABI's by Dr. Bridgett Larsson and this showed that he did have ABI was within normal limits which does correspond with ABI's we checked here in the office today. That study was on 01/13/16. With all that being said on evaluation today there does not appear to be any evidence of a true open wound of the left lateral fifth metatarsal region. He does have tenderness noted as well is a little bit of boggy/fluctuance feeling to the area in this region of his foot and there is definitely a very solid and firm callous noted laterally. With that being said I am not able to find any  obvious opening at this point there does appear to be a spot where there appears to been a opening very recently however patient states he has not noted any drainage from this currently. He does however have discomfort with palpation of this area this is in the 3-4/10 range only with very firm palpation this is not too significant at all. Subsequently he does have a small ulcer at the medial nail bed of the right second toe where he inadvertently pulled off a portion of the toenail softly causing this injury. Otherwise there does not appear to be any evidence of infection in regard to this right second toe. XADEN, KAUFMAN ([id number]) 09/23/17 on evaluation today patient actually appears to be doing well he has no open ulcerations noted at this point. With that being said he did have significant callous noted over the left lateral foot which did require callous pairing today he tolerated this without complication. Readmission: 11/07/17 patient presents today for readmission concerning to ulcers that he has. One on the right plantar fifth metatarsal region and the other on the left plantar fifth metatarsal region. He has previously had an x-ray which was performed on 11/05/17 and revealed that the patient had wounds noted of the base of the fifth metatarsal region without evidence of destruction to the bone to suggest osteomyelitis. Obviously this is excellent news. With that being said he does have a significantly large ulcer that extensively plantar surface of the wound and is concerning for the fact that he continues to walk on this due to the patient telling me that he "has to work" I do believe this has likely led to both the opening of the wound as well as the fact that is not really able to heal very well at this point. He has been seen by his podiatrist and apparently according to the note dated 10/23/17 the patient was provided with Santyl and  recommended a dry dressing. No wound culture has  been obtained at this point. As stated above he has had returned to work due to financial reasons and is not able to wear his postop shoes due to the fact that he cannot wear this and work. The wound is stated to have been present for "several weeks" prior to the visit with the podiatrist on 10/23/17. With that being said again at least the good news is there does not appear to be any evidence of osteomyelitis. Electronic Signature(s) Signed: 11/08/2017 10:27:37 AM By: Worthy Keeler PA-C Entered By: Worthy Keeler on 11/07/2017 16:55:15 [name] ([id number]) -------------------------------------------------------------------------------- Physical Exam Details Patient Name: RAFAY, [name]. Date of Service: 11/07/2017 12:30 PM Medical Record Number: [id number] Patient Account Number: [id number] Date of Birth/Sex: 25-Feb-1961 (57 y.o. M) Treating RN: Montey Hora Primary Care Provider: Lorelee Market Other Clinician: Referring Provider: Referral, Self Treating Provider/Extender: STONE III, HOYT Weeks in Treatment: 0 Constitutional Obese and well-hydrated in no acute distress. Eyes conjunctiva clear no eyelid edema noted. pupils equal round and reactive to light and accommodation. Ears, Nose, Mouth, and Throat no gross abnormality of ear auricles or external auditory canals. normal hearing noted during conversation. mucus membranes moist. Respiratory normal breathing without difficulty. clear to auscultation bilaterally. Cardiovascular regular rate and rhythm with normal S1, S2. 2+ dorsalis pedis/posterior tibialis pulses. no clubbing, cyanosis, significant edema, <3 sec cap refill. Gastrointestinal (GI) soft, non-tender, non-distended, +BS. no ventral hernia noted. Musculoskeletal normal gait and posture. no significant deformity or arthritic changes, no loss or range of motion, no clubbing. Psychiatric this patient is able to make decisions and demonstrates good insight  into disease process. Alert and Oriented x 3. pleasant and cooperative. Notes On inspection the patient's right fifth plantar metatarsal ulcer appears to be fairly superficial which is good news. With that being said he the same cannot be said for the left fifth metatarsal region where he has a significantly deep ulcer at the site where he has had complications in the past. He does have somewhat of a Charcot deformity at this point which I think causes abnormal pressure to the site which is why this continues to reopen especially when he has to be upon it as he has been now that he's back at work due to financial reasons. I was not able to debride the left foot due to pain in the possibility of infection a culture was obtained today. However the right foot was debrided to remove away the callous in this appear to do very well and appear to be greatly improved post debridement. Electronic Signature(s) Signed: 11/08/2017 10:27:37 AM By: Worthy Keeler PA-C Entered By: Worthy Keeler on 11/07/2017 16:57:13 [name] ([id number]) -------------------------------------------------------------------------------- Physician Orders Details Patient Name: BARON, PARMELEE. Date of Service: 11/07/2017 12:30 PM Medical Record Number: [id number] Patient Account Number: [id number] Date of Birth/Sex: [birth year] (57 y.o. M) Treating RN: Montey Hora Primary Care Provider: Lorelee Market Other Clinician: Referring Provider: Referral, Self Treating Provider/Extender: Melburn Hake, HOYT Weeks in Treatment: 0 Verbal / Phone Orders: No Diagnosis Coding ICD-10 Coding Code Description E11.621 Type 2 diabetes mellitus with foot ulcer L97.522 Non-pressure chronic ulcer of other part of left foot with fat layer exposed L97.512 Non-pressure chronic ulcer of other part of right foot with fat layer exposed Wound Cleansing Wound #12 Left,Lateral Foot o May Shower, gently pat wound dry prior to applying  new dressing. Wound #13 Right,Plantar Metatarsal head first o May Shower, gently  pat wound dry prior to applying new dressing. Anesthetic (add to Medication List) Wound #12 Left,Lateral Foot o Topical Lidocaine 4% cream applied to wound bed prior to debridement (In Clinic Only). Wound #13 Right,Plantar Metatarsal head first o Topical Lidocaine 4% cream applied to wound bed prior to debridement (In Clinic Only). Primary Wound Dressing Wound #12 Left,Lateral Foot o Silver Alginate Wound #13 Right,Plantar Metatarsal head first o Silver Collagen Secondary Dressing Wound #12 Left,Lateral Foot o Gauze, ABD and Kerlix/Conform - offload with foam or felt Wound #13 Right,Plantar Metatarsal head first o Gauze, ABD and Kerlix/Conform - offload with foam or felt Dressing Change Frequency Wound #12 Left,Lateral Foot o Change dressing every day. Wound #13 Right,Plantar Metatarsal head first o Change dressing every day. Follow-up Appointments QUENTIN, SHOREY ([id number]) Wound #12 Left,Lateral Foot o Return Appointment in 1 week. Wound #13 Right,Plantar Metatarsal head first o Return Appointment in 1 week. Additional Orders / Instructions Wound #12 Left,Lateral Foot o Increase protein intake. Wound #13 Right,Plantar Metatarsal head first o Increase protein intake. Laboratory o Bacteria identified in Wound by Culture (MICRO) oooo LOINC Code: 267 [id number] oooo Convenience Name: Wound culture routine Electronic Signature(s) Signed: 11/07/2017 4:42:41 PM By: Montey Hora Signed: 11/08/2017 10:27:37 AM By: Worthy Keeler PA-C Entered By: Montey Hora on 11/07/2017 13:46:36 [name] ([id number]) -------------------------------------------------------------------------------- Problem List Details Patient Name: KHIYAN, CRACE. Date of Service: 11/07/2017 12:30 PM Medical Record Number: [id number] Patient Account Number: [id number] Date of Birth/Sex: Nov 11, 1960  (57 y.o. M) Treating RN: Montey Hora Primary Care Provider: Lorelee Market Other Clinician: Referring Provider: Referral, Self Treating Provider/Extender: Melburn Hake, HOYT Weeks in Treatment: 0 Active Problems ICD-10 Impacting Encounter Code Description Active Date Wound Healing Diagnosis E11.621 Type 2 diabetes mellitus with foot ulcer 11/07/2017 Yes L97.522 Non-pressure chronic ulcer of other part of left foot with fat 11/07/2017 Yes layer exposed L97.512 Non-pressure chronic ulcer of other part of right foot with fat 11/07/2017 Yes layer exposed Inactive Problems Resolved Problems Electronic Signature(s) Signed: 11/08/2017 10:27:37 AM By: Worthy Keeler PA-C Entered By: Worthy Keeler on 11/07/2017 13:26:57 [name] ([id number]) -------------------------------------------------------------------------------- Progress Note/History and Physical Details Patient Name: [name]. Date of Service: 11/07/2017 12:30 PM Medical Record Number: [id number] Patient Account Number: [id number] Date of Birth/Sex: 04-Sep-1960 (57 y.o. M) Treating RN: Montey Hora Primary Care Provider: Lorelee Market Other Clinician: Referring Provider: Referral, Self Treating Provider/Extender: Melburn Hake, HOYT Weeks in Treatment: 0 Subjective Chief Complaint Information obtained from Patient Bilateral foot ulcers History of Present Illness (HPI) Pleasant 57 year old with history of diabetes (Hgb A1c 10.8 in 2014) and peripheral neuropathy. No PVD. L ABI 1.1. Status post right great toe partial amputation years ago. He was at work and on 10/22/2014, was injured by a cart, and suffered an ulceration to his left anterior calf. He says that it subsequently became infected, and he was treated with a course of antibiotics. He was found on initial exam to have an ulceration on the dorsum of his left third toe. He was unaware of this and attributes it to pressure from his steel toed  boots. More recently he injured his right anterior calf on a cart. Ambulating normally per his baseline. He has been undergoing regular debridements, applying mupirocin cream, and an Ace wrap for edema control. He returns to clinic for follow-up and is without complaints. No pain. No fever or chills. No drainage. 10/25/15; this is a 57 year old man who has type II diabetes with diabetic polyneuropathy. He tells me  that he fractured his left fifth metatarsal in June 2016 when he presented with swelling. He does not recall a specific injury. His hemoglobin A1c was apparently too high at the time for consideration of surgery and he was put in some form of offloading. Ultimately he went to surgery in December with an allograft from his calcaneus to this site, plate and screws. He had an x-ray of the foot in March that showed concerns about nonunion. He tells me that in March he had to move and basically moved himself. He was on his foot a lot and then noticed some drainage from an open area. He has been following with his orthopedic surgeon Dr. Doran Durand. He has been applying a felt donut, dry dressing and using his heel healing sandal. 11/01/15; this is a patient I saw last week for the first time. He had a small open wound on the plantar aspect of his left foot at roughly the level of the base of his fifth metatarsal. He had a considerable degree of thickened skin around this wound on the plantar aspect which I thought was from chronic pressure on this area. He tells [place] that he had drainage over the course of the week. No systemic symptoms. 11/08/15; culture last week grew Citrobacter korseri. This should've been sensitive to the Augmentin I gave him. He has seen Dr. Doran Durand who did his initial surgery and according the patient the plan is to give this another month and then the hardware might need to come out of this. This seems like a reasonable plan. I will adjust his antibiotics to ciprofloxacin which  probably should continue for at least another 2 weeks. I gave him 10 days worth today 11/22/15 the patient has completed antibiotics. He has an appointment with Dr. Doran Durand this Friday. There is improved dimensions around the wound on the left fifth metatarsal base 11/29/15; the patient has completed antibiotics last week. Apparently his appointment with Dr. Doran Durand it is not until this Friday. Dimensions are roughly the same. 12/06/15; saw Dr. Doran Durand. No x-ray told the end of the month, next appointment June 30. We have been using Aquacel Ag 12/13/15: No major change this week. Using Aquacel AG 621/17; arrived this week with maceration around the wound. There was quite a bit of undermining which required surgical debridement. I changed him to [facility] last week, by the patient's admission he was up on this more this week 12/27/15; macerated tissue around the wound is removed with a scalpel and pickups. There is no undermining. Nonviable subcutaneous tissue and skin taken from the superior circumference of the wound is slough from the surface. READMISSION 03/06/16 since I last saw this patient at the end of June, he went for surgery on 01/11/16 by Dr. Doran Durand of orthopedics. He had a left foot irrigation and debridement, removal of hardware and placement of wound VAC. He is also been followed by Dr. Megan Salon of infectious disease and completed a six-week course of IV Rocephin for group B strep and Enterobacter in the SKYELAR, SWIGART. ([id number]) bone at the time of surgery. Apparently at the time of surgery the bone looked healthy so I don't think any bone was actually removed. He has been using silver alginate based dressings on the same wound area at the base of the left fifth metatarsal on the left. I note that he is also had arterial studies on 01/08/16, these showed a left ABI of 1.2 to and a right ABI of 1.3. Waveforms were listed  as biphasic. He was not felt to have any specific arterial  issues. 03/13/16; no real change in the condition of the wound at the left lateral foot at roughly the base of his fifth metatarsal. Use silver alginate last week. 03/18/16 arrives today with no open area. Being suspicious of the overlying callus I. Some of this back although I see nothing but covering tissue here/epithelium. There is no surrounding tenderness READMISSION 04/16/16 this is a patient I discharged about a month ago. Initially a surgical wound on the lateral aspect of his left foot which subsequently became infected. The story he is giving today that he went back into his own modified shoe started to notice pain 2 weeks ago he was seen in Dr. Nona Dell office by a physician assistant last Wednesday and according the patient was told that everything looked fine however this is clearly now broken open and he has an open wound in the same spot that we have been dealing with repetitively. Situation is complicated by the fact that he is running short of money on long-term disability. He has not taken his insulin and at least 2 weeks was previously on NovoLog short acting insulin on a sliding scale and TRESIBA 35 units at bedtime. He is no longer able to afford any of his medication he was in the x-ray on 10/15. A plain x-ray showed healed fracture of the left fifth metatarsal bone status post removal of the associated plate and screw fixation hardware. There was no acute appearing osseous abnormality. His blood work showed a white count of 9.2 with an essentially normal differential comprehensive metabolic panel was normal. Previous CT scan of the foot on 01/08/16 showed no osteomyelitis previous vascular workup showed no evidence of significant PAD on 01/13/16 04/23/16; culture I did last week grew enterococcus [ampicillin sensitive] and MRSA. He saw infectious disease yesterday. They stop the clindamycin and ordered an MRI. This is not unreasonable. All the hardware is out of the foot at this  point. 04/30/16 at this point in time we are still awaiting the results of the MRI at this point in time. Patient did have an area which appears to be somewhat macerated in the proximal portion of the wound where there is overlying necrotic skin that is doing nothing more than trapping fluid underneath. He continues to state that he does have some discomfort and again the concern is for the possibility of osteomyelitis hence the reason for the MRI order. We have been using a silver alginate dressing but again I think the reason this with his macerated as it was is that the dressing obscene could not reach the entirety of the wound due to some necrotic skin covering the proximal portion. No bleeding noted at this point in time on initial evaluation. 05/07/16 we receive the results of patient's MRI which shows that he has no evidence of osteomyelitis. This obviously is excellent news. Patient was definitely happy to hear this. He tells me the wound appears to be doing very well at this point in time and is pleased with the progress currently. 05/15/16; as noted the patient did not have osteomyelitis. He has been released by infectious disease and orthopedics. His wound is still open he had a debridement last week but complain when he got home he "bleeding for half a day". He is not had any pain. We have been using silver alginate with Kerlix gauze wrap. 05/28/16; patient arrives today with the wound in much the same condition as last  time. He has a small opening on the lateral aspect of his foot however with debridement there is clear undermining medially there is no real evidence of infection here and I didn't see any point in culturing this. One would have to wonder if this isn't a simple matter of in adequate offloading as he is been using a healing sandal. 06/05/16; the open area is now on the plantar aspect of his foot and not decide. The wound almost appears to have "migrated". This was the term  use by our intake nurse. 06/12/16; open area on the plantar aspect of his foot. Base of the wound looks very healthy. This will be his second week in a total contact cast 06/19/16; patient arrived today in a total contact cast. There was some expectation from our staff and myself that this area would be healed. Unfortunately the area was boggy and with rec pressure a fairly substantial amount of purulent drainage was obtained. Specimen obtained for culture. The patient had no complaints of systemic problems including fever or chills or instability of his diabetes. There was no pain in the foot. Nevertheless a extensive debridement was required. 06/26/16; patient's culture from the abscess last week grew a combination of MRSA and ampicillin sensitive enterococcus. I had him on Augmentin and Septra however I have elected to give him a full 10 day course of Zyvox instead as I Recently treated this combination of organisms with Augmentin and Septra before. Arrives today with no systemic symptoms 07/03/16; the patient has 2 more treatments of Zyvox and then he is finished antibiotics the wound has improved now mostly on the lateral aspect of his foot. There is still some tenderness when he walks. 07/10/16; patient arrives today with Zyvox completed. He only has a small open area remaining. 07/24/16; she arrives here with no overt open area. The covering is thick callus/eschar. Nevertheless there is no open area here. He has some tenderness underneath the area but no overt infection is observed no drainage. The patient has a deformity in the foot with this area weekly to be exposed to more pressure in his foot where nevertheless that something we are going to have to deal with going forward. The patient has diabetic workboots and diabetic shoes. He has had a long DOLTON, SHAKER. ([id number]) difficult course with the area here. This started as a fracture at work. He had bone grafting from his calcaneus and  screws. This got infected he had to have more surgery on the area. Bone at the time of that surgery I think showed enterococcus and group B strep. He had 6 weeks of Rocephin. Since then the area has waxed and waned in its difficulty. Recently he had an MRI in December that did not show osteomyelitis nevertheless he had an abscess that grew MRSA and enterococcus which I elected to treat with Zyvox. This was in December and this wound is actually "healed" over 08/21/16; the patient came in today for his one-month follow-up visit. The area on the lateral aspect of his left foot looks much the same as some month ago. There is no evidence of an open wound here. However the patient tells me about a week after he went back to work he developed severe pain and swelling in the plantar aspect of his right foot first and fifth metatarsal heads. 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 He went to see  his podiatrist at [facility]. She told him that he could not work on his feet. She told him to go back and his cam walking boot on the left. Not have open wounds obviously on the right. The patient is actually gone ahead and retired from his job because he does not feel he can work on his feet 09/18/16; patient comes back in saying he recently had some pain on the lateral aspect of his left foot. Asked that we look at this. Other than that all of his wounds have a viable surface. He has diabetic shoes. He is retired from work. READMISSION Tavaughn Silguero is a 57 year old man who we cared for for a prolonged period of time in late 2017 into March 2018. At that point he had suffered a fracture of his left fifth metatarsal at about the level of the base of the fifth metatarsal. He had surgical repair by Dr. Meryl Dare and he developed a very refractory wound over the fifth metatarsal. Ultimately this became infected he required hardware  removal this eventually closed over and we discharged from the clinic in March of this year. He tells me as well until roughly 3 weeks ago when he developed increasing pain in the same area making it difficult for him to sleep. He was admitted to hospital from 9/5 through 9/7 with now an ulcer in the same area on the lateral aspect of his left foot at the level of the base of the metatarsal. X-rays and MRIs during this hospitalization were negative. Wound culture showed a combination of Escherichia coli, Morganella and Pseudomonas. He was given IV Cipro and vancomycin the hospital and then discharged home on Cipro and Doxy. He returned to the ER on 9/16 with leg swelling and discomfort. I don't think anything was added at that point although he did have an ultrasound of the left leg that was negative for DVT. He was back in the ER on 10/4 again with pain in the area he was given Bactrim but states he had a significant allergic reaction to that which has abated once he stopped the Bactrim. I don't think he had a full course. He went to see his podiatrist yesterday who recommended some form of foot soak. He has a Darco forefoot offloading boot The patient is a type II diabetic on insulin. Tells me his recent hemoglobin A1c was 7. Arterial studies done in July 2017 showed an ABI in the right of 1.3 on the left 1.2 to biphasic waveforms bilaterally. He was not felt to have arterial disease. As noted the patient has had 2 MRIs most recently on 10/4 this showed no evidence of an abscess or osteomyelitis and no change since the prior study of 03/05/17 there was nonspecific edema on the dorsum of the foot. ABIs in this clinic today 1.2 which would be unchanged from his study done in July/17 04/15/17; now down to 1 small wound on the left lateral foot. We wrapped him and applied collagen last week and the foot is fairly macerated. 04/22/17; small wound on the left lateral foot however it has some undermining.  Using collagen 04/29/17; small wound on the left lateral foot some surrounding callus. Chronic damage in this area as a result of initial fracture nonhealing and secondary infection. 05/06/17; the patient arrives today with the area on the left lateral foot closed. There is thick subcutaneous tissue with a layer of callus. I took some of the callus off just to ensure adequate closure of the  underlying tissue and there is no open wound here. He has considerable deformity of this area of his foot however and unless there is an ability to offload this I think opening is going to be recurrent. He tells me he has diabetic foot wear although I have not actually seen this Readmission: 09/02/17 on evaluation today patient appears to be doing a little better compared to what he tells me what's going on in regard to his left lateral foot. He was previously seen in the ER this past Saturday it is now Tuesday. On Saturday he actually was treated with antibiotics including Cipro 500 mg two times a day and doxycycline 100 mg two times a day. Subsequently he also did have an x-ray performed of his left foot which revealed increased degenerative changes of the base of the fifth metatarsal since comparison in 2018. There was no radiographic evidence of osteomyelitis however. Patient has previously had an MRI of the left foot which was performed on 04/03/17 in this revealed at that point a soft tissue ulceration but no evidence of osteomyelitis. Patient has previously undergone testing in regard to his ABI's by Dr. Bridgett Larsson and this showed that he did have ABI was within normal limits which does correspond with ABI's we checked here in the office today. That study was on 01/13/16. With all that being said on evaluation today there does not appear to be any evidence of a true open wound of the left lateral fifth metatarsal region. He does have tenderness noted as well is a little bit of boggy/fluctuance feeling to the CAM, DAUPHIN. ([id number]) area in this region of his foot and there is definitely a very solid and firm callous noted laterally. With that being said I am not able to find any obvious opening at this point there does appear to be a spot where there appears to been a opening very recently however patient states he has not noted any drainage from this currently. He does however have discomfort with palpation of this area this is in the 3-4/10 range only with very firm palpation this is not too significant at all. Subsequently he does have a small ulcer at the medial nail bed of the right second toe where he inadvertently pulled off a portion of the toenail softly causing this injury. Otherwise there does not appear to be any evidence of infection in regard to this right second toe. 09/23/17 on evaluation today patient actually appears to be doing well he has no open ulcerations noted at this point. With that being said he did have significant callous noted over the left lateral foot which did require callous pairing today he tolerated this without complication. Readmission: 11/07/17 patient presents today for readmission concerning to ulcers that he has. One on the right plantar fifth metatarsal region and the other on the left plantar fifth metatarsal region. He has previously had an x-ray which was performed on 11/05/17 and revealed that the patient had wounds noted of the base of the fifth metatarsal region without evidence of destruction to the bone to suggest osteomyelitis. Obviously this is excellent news. With that being said he does have a significantly large ulcer that extensively plantar surface of the wound and is concerning for the fact that he continues to walk on this due to the patient telling me that he "has to work" I do believe this has likely led to both the opening of the wound as well as the fact that is not really  able to heal very well at this point. He has been seen by his podiatrist  and apparently according to the note dated 10/23/17 the patient was provided with Santyl and recommended a dry dressing. No wound culture has been obtained at this point. As stated above he has had returned to work due to financial reasons and is not able to wear his postop shoes due to the fact that he cannot wear this and work. The wound is stated to have been present for "several weeks" prior to the visit with the podiatrist on 10/23/17. With that being said again at least the good news is there does not appear to be any evidence of osteomyelitis. Wound History Patient presents with 2 open wounds that have been present for approximately 2 weeks. Patient has been treating wounds in the following manner: unknown. Laboratory tests have not been performed in the last month. Patient reportedly has tested positive for an antibiotic resistant organism. Patient reportedly has tested positive for osteomyelitis. Patient reportedly has not had testing performed to evaluate circulation in the legs. Patient History Information obtained from Patient. Allergies Bactrim Family History Diabetes - Mother,Father, Heart Disease - Father,Mother, Hypertension - Mother,Father, Stroke - Father, No family history of Cancer, Hereditary Spherocytosis, Kidney Disease, Lung Disease, Seizures, Thyroid Problems, Tuberculosis. Social History Never smoker, Marital Status - Married, Alcohol Use - Never, Drug Use - No History, Caffeine Use - Daily. Medical History Eyes Denies history of Cataracts, Glaucoma, Optic Neuritis Ear/Nose/Mouth/Throat Denies history of Chronic sinus problems/congestion, Middle ear problems Hematologic/Lymphatic Denies history of Anemia, Hemophilia, Human Immunodeficiency Virus, Lymphedema, Sickle Cell Disease Respiratory Denies history of Aspiration, Asthma, Chronic Obstructive Pulmonary Disease (COPD), Pneumothorax, Sleep Apnea, Tuberculosis Cardiovascular Patient has history of  Hypertension JAELYN, CLONINGER ([id number]) Denies history of Angina, Arrhythmia, Congestive Heart Failure, Coronary Artery Disease, Deep Vein Thrombosis, Hypotension, Myocardial Infarction, Peripheral Arterial Disease, Peripheral Venous Disease, Phlebitis, Vasculitis Gastrointestinal Denies history of Cirrhosis , Colitis, Crohn s, Hepatitis A, Hepatitis B, Hepatitis C Endocrine Patient has history of Type II Diabetes Denies history of Type I Diabetes Genitourinary Denies history of End Stage Renal Disease Immunological Denies history of Lupus Erythematosus, Raynaud s, Scleroderma Integumentary (Skin) Denies history of History of Burn, History of pressure wounds Musculoskeletal Denies history of Gout, Rheumatoid Arthritis, Osteoarthritis, Osteomyelitis Neurologic Patient has history of Neuropathy Denies history of Dementia, Quadriplegia, Paraplegia, Seizure Disorder Oncologic Denies history of Received Chemotherapy, Received Radiation Psychiatric Denies history of Anorexia/bulimia, Confinement Anxiety Patient is treated with Insulin. Blood sugar is tested. Review of Systems (ROS) Constitutional Symptoms (General Health) The patient has no complaints or symptoms. Eyes Complains or has symptoms of Glasses / Contacts. Ear/Nose/Mouth/Throat The patient has no complaints or symptoms. Hematologic/Lymphatic The patient has no complaints or symptoms. Respiratory The patient has no complaints or symptoms. Gastrointestinal The patient has no complaints or symptoms. Immunological The patient has no complaints or symptoms. Integumentary (Skin) Complains or has symptoms of Wounds. Musculoskeletal The patient has no complaints or symptoms. Oncologic The patient has no complaints or symptoms. Psychiatric The patient has no complaints or symptoms. Objective SILAS, MUFF ([id number]) Constitutional Obese and well-hydrated in no acute distress. Vitals Time Taken: 12:45 PM, Height:  70 in, Source: Stated, Weight: 265 lbs, Source: Stated, BMI: 38. Eyes conjunctiva clear no eyelid edema noted. pupils equal round and reactive to light and accommodation. Ears, Nose, Mouth, and Throat no gross abnormality of ear auricles or external auditory canals. normal hearing noted during conversation. mucus membranes moist. Respiratory normal  breathing without difficulty. clear to auscultation bilaterally. Cardiovascular regular rate and rhythm with normal S1, S2. 2+ dorsalis pedis/posterior tibialis pulses. no clubbing, cyanosis, significant edema, Gastrointestinal (GI) soft, non-tender, non-distended, +BS. no ventral hernia noted. Musculoskeletal normal gait and posture. no significant deformity or arthritic changes, no loss or range of motion, no clubbing. Psychiatric this patient is able to make decisions and demonstrates good insight into disease process. Alert and Oriented x 3. pleasant and cooperative. General Notes: On inspection the patient's right fifth plantar metatarsal ulcer appears to be fairly superficial which is good news. With that being said he the same cannot be said for the left fifth metatarsal region where he has a significantly deep ulcer at the site where he has had complications in the past. He does have somewhat of a Charcot deformity at this point which I think causes abnormal pressure to the site which is why this continues to reopen especially when he has to be upon it as he has been now that he's back at work due to financial reasons. I was not able to debride the left foot due to pain in the possibility of infection a culture was obtained today. However the right foot was debrided to remove away the callous in this appear to do very well and appear to be greatly improved post debridement. Integumentary (Hair, Skin) Wound #12 status is Open. Original cause of wound was Gradually Appeared. The wound is located on the Left,Lateral Foot. The wound measures  1.5cm length x 0.8cm width x 0.2cm depth; 0.942cm^2 area and 0.188cm^3 volume. There is no tunneling noted, however, there is undermining starting at 12:00 and ending at 12:00 with a maximum distance of 0.9cm. There is a large amount of serosanguineous drainage noted. The wound margin is distinct with the outline attached to the wound base. There is medium (34-66%) red granulation within the wound bed. There is a medium (34-66%) amount of necrotic tissue within the wound bed including Eschar and Adherent Slough. The periwound skin appearance exhibited: Callus. The periwound skin appearance did not exhibit: Maceration. Periwound temperature was noted as No Abnormality. The periwound has tenderness on palpation. Wound #13 status is Open. Original cause of wound was Gradually Appeared. The wound is located on the Right,Plantar Metatarsal head first. The wound measures 0.3cm length x 0.2cm width x 0.3cm depth; 0.047cm^2 area and 0.014cm^3 volume. There is no tunneling noted, however, there is undermining starting at 12:00 and ending at 12:00 with a maximum distance of 0.3cm. There is a large amount of serous drainage noted. The wound margin is distinct with the outline attached to the wound base. There is no granulation within the wound bed. There is a large (67-100%) amount of necrotic tissue within the wound bed including Eschar and Adherent Slough. The periwound skin appearance exhibited: Callus. Periwound temperature was noted as No Abnormality. The periwound has tenderness on palpation. IZEL, EISENHARDT ([id number]) Assessment Active Problems ICD-10 E11.621 - Type 2 diabetes mellitus with foot ulcer L97.522 - Non-pressure chronic ulcer of other part of left foot with fat layer exposed L97.512 - Non-pressure chronic ulcer of other part of right foot with fat layer exposed Procedures Wound #13 Pre-procedure diagnosis of Wound #13 is a Diabetic Wound/Ulcer of the Lower Extremity located on the  Right,Plantar Metatarsal head first .Severity of Tissue Pre Debridement is: Fat layer exposed. There was a Excisional Skin/Subcutaneous Tissue Debridement with a total area of 0.06 sq cm performed by STONE III, HOYT E., PA-C. With the following  instrument (s): Curette to remove Viable and Non-Viable tissue/material. Material removed includes Callus, Subcutaneous Tissue, and Slough after achieving pain control using Lidocaine 4% Topical Solution. No specimens were taken. A time out was conducted at 13:38, prior to the start of the procedure. A Minimum amount of bleeding was controlled with Pressure. The procedure was tolerated well with a pain level of 0 throughout and a pain level of 0 following the procedure. Patient s Level of Consciousness post procedure was recorded as Awake and Alert and post-procedure vitals were taken including Temperature: 98.1 F, Pulse: 86 bpm, Respiratory Rate: 18 breaths/min, Blood Pressure: (127)/(75) mmHg. Post Debridement Measurements: 0.4cm length x 0.6cm width x 0.1cm depth; 0.019cm^3 volume. Character of Wound/Ulcer Post Debridement is improved. Severity of Tissue Post Debridement is: Fat layer exposed. Post procedure Diagnosis Wound #13: Same as Pre-Procedure Plan Wound Cleansing: Wound #12 Left,Lateral Foot: May Shower, gently pat wound dry prior to applying new dressing. Wound #13 Right,Plantar Metatarsal head first: May Shower, gently pat wound dry prior to applying new dressing. Anesthetic (add to Medication List): Wound #12 Left,Lateral Foot: Topical Lidocaine 4% cream applied to wound bed prior to debridement (In Clinic Only). Wound #13 Right,Plantar Metatarsal head first: Topical Lidocaine 4% cream applied to wound bed prior to debridement (In Clinic Only). Primary Wound Dressing: Wound #12 Left,Lateral Foot: Silver Alginate Wound #13 Right,Plantar Metatarsal head first: Silver Collagen Secondary Dressing: Wound #12 Left,Lateral Foot: Gauze, ABD  and Kerlix/Conform - offload with foam or felt Wound #13 Right,Plantar Metatarsal head first: Gauze, ABD and Kerlix/Conform - offload with foam or felt Dressing Change Frequency: [name], [name] ([id number]) Wound #12 Left,Lateral Foot: Change dressing every day. Wound #13 Right,Plantar Metatarsal head first: Change dressing every day. Follow-up Appointments: Wound #12 Left,Lateral Foot: Return Appointment in 1 week. Wound #13 Right,Plantar Metatarsal head first: Return Appointment in 1 week. Additional Orders / Instructions: Wound #12 Left,Lateral Foot: Increase protein intake. Wound #13 Right,Plantar Metatarsal head first: Increase protein intake. Laboratory ordered were: Wound culture routine I'm gonna suggest currently that we go ahead and initiate the above wound care measures for the next week. Hopefully he will do well with the doxycycline that has been prescribed will see what the wound culture shows. In the meantime if anything changes he'll let me know otherwise we will see him for reevaluation in one weeks time to see were things stand. Please see above for specific wound care orders. We will see patient for re-evaluation in 1 week(s) here in the clinic. If anything worsens or changes patient will contact our office for additional recommendations. Electronic Signature(s) Signed: 11/08/2017 10:27:37 AM By: Worthy Keeler PA-C Entered By: Worthy Keeler on 11/07/2017 16:58:07 [name] ([id number]) -------------------------------------------------------------------------------- ROS/PFSH Details Patient Name: CAYDAN, MCTAVISH. Date of Service: 11/07/2017 12:30 PM Medical Record Number: [id number] Patient Account Number: [id number] Date of Birth/Sex: 1960-09-25 (57 y.o. M) Treating RN: Ahmed Prima Primary Care Provider: Lorelee Market Other Clinician: Referring Provider: Referral, Self Treating Provider/Extender: Melburn Hake, HOYT Weeks in Treatment: 0 Label  Progress Note Print Version as History and Physical for this encounter Information Obtained From Patient Wound History Do you currently have one or more open woundso Yes How many open wounds do you currently haveo 2 Approximately how long have you had your woundso 2 weeks How have you been treating your wound(s) until nowo unknown Has your wound(s) ever healed and then re-openedo No Have you had any lab work done in the past montho No Have you  tested positive for osteomyelitis (bone infection)o Yes Have you had any tests for circulation on your legso No Eyes Complaints and Symptoms: Positive for: Glasses / Contacts Medical History: Negative for: Cataracts; Glaucoma; Optic Neuritis Integumentary (Skin) Complaints and Symptoms: Positive for: Wounds Medical History: Negative for: History of Burn; History of pressure wounds Constitutional Symptoms (General Health) Complaints and Symptoms: No Complaints or Symptoms Ear/Nose/Mouth/Throat Complaints and Symptoms: No Complaints or Symptoms Medical History: Negative for: Chronic sinus problems/congestion; Middle ear problems Hematologic/Lymphatic Complaints and Symptoms: No Complaints or Symptoms Medical History: Negative for: Anemia; Hemophilia; Human Immunodeficiency Virus; Lymphedema; Sickle Cell Disease IVANN, TRIMARCO ([id number]) Respiratory Complaints and Symptoms: No Complaints or Symptoms Medical History: Negative for: Aspiration; Asthma; Chronic Obstructive Pulmonary Disease (COPD); Pneumothorax; Sleep Apnea; Tuberculosis Cardiovascular Medical History: Positive for: Hypertension Negative for: Angina; Arrhythmia; Congestive Heart Failure; Coronary Artery Disease; Deep Vein Thrombosis; Hypotension; Myocardial Infarction; Peripheral Arterial Disease; Peripheral Venous Disease; Phlebitis; Vasculitis Gastrointestinal Complaints and Symptoms: No Complaints or Symptoms Medical History: Negative for: Cirrhosis ; Colitis;  Crohnos; Hepatitis A; Hepatitis B; Hepatitis C Endocrine Medical History: Positive for: Type II Diabetes Negative for: Type I Diabetes Time with diabetes: 20 yrs Treated with: Insulin Blood sugar tested every day: Yes Tested : 3 times weekly Genitourinary Medical History: Negative for: End Stage Renal Disease Immunological Complaints and Symptoms: No Complaints or Symptoms Medical History: Negative for: Lupus Erythematosus; Raynaudos; Scleroderma Musculoskeletal Complaints and Symptoms: No Complaints or Symptoms Medical History: Negative for: Gout; Rheumatoid Arthritis; Osteoarthritis; Osteomyelitis Neurologic Medical History: Positive for: Neuropathy KAMDON, REISIG ([id number]) Negative for: Dementia; Quadriplegia; Paraplegia; Seizure Disorder Oncologic Complaints and Symptoms: No Complaints or Symptoms Medical History: Negative for: Received Chemotherapy; Received Radiation Psychiatric Complaints and Symptoms: No Complaints or Symptoms Medical History: Negative for: Anorexia/bulimia; Confinement Anxiety Immunizations Pneumococcal Vaccine: Received Pneumococcal Vaccination: No Immunization Notes: up to date Implantable Devices Family and Social History Cancer: No; Diabetes: Yes - Mother,Father; Heart Disease: Yes - Father,Mother; Hereditary Spherocytosis: No; Hypertension: Yes - Mother,Father; Kidney Disease: No; Lung Disease: No; Seizures: No; Stroke: Yes - Father; Thyroid Problems: No; Tuberculosis: No; Never smoker; Marital Status - Married; Alcohol Use: Never; Drug Use: No History; Caffeine Use: Daily; Financial Concerns: No; Food, Clothing or Shelter Needs: No; Support System Lacking: No; Transportation Concerns: No; Advanced Directives: No; Patient does not want information on Advanced Directives; Do not resuscitate: No; Living Will: No; Medical Power of Attorney: No Electronic Signature(s) Signed: 11/07/2017 4:00:06 PM By: Alric Quan Signed: 11/08/2017  10:27:37 AM By: Worthy Keeler PA-C Entered By: Alric Quan on 11/07/2017 12:50:00 [name] ([id number]) -------------------------------------------------------------------------------- SuperBill Details Patient Name: [name]. Date of Service: 11/07/2017 Medical Record Number: [id number] Patient Account Number: [id number] Date of Birth/Sex: Aug 17, 1960 (57 y.o. M) Treating RN: Montey Hora Primary Care Provider: Lorelee Market Other Clinician: Referring Provider: Referral, Self Treating Provider/Extender: Melburn Hake, HOYT Weeks in Treatment: 0 Diagnosis Coding ICD-10 Codes Code Description E11.621 Type 2 diabetes mellitus with foot ulcer L97.522 Non-pressure chronic ulcer of other part of left foot with fat layer exposed L97.512 Non-pressure chronic ulcer of other part of right foot with fat layer exposed Facility Procedures CPT4 Code: 31517616 Description: 99213 - WOUND CARE VISIT-LEV 3 EST PT Modifier: Quantity: 1 CPT4 Code: [id number] Description: 11042 - DEB SUBQ TISSUE 20 SQ CM/< ICD-10 Diagnosis Description L97.512 Non-pressure chronic ulcer of other part of right foot with fat Modifier: layer exposed Quantity: 1 Physician Procedures CPT4 Code: [id number] Description: 99214 - WC PHYS LEVEL 4 - EST  PT ICD-10 Diagnosis Description E11.621 Type 2 diabetes mellitus with foot ulcer L97.522 Non-pressure chronic ulcer of other part of left foot with fat L97.512 Non-pressure chronic ulcer of other part of right  foot with fat Modifier: 25 layer exposed layer exposed Quantity: 1 CPT4 Code: [id number] Description: 20100 - WC PHYS SUBQ TISS 20 SQ CM ICD-10 Diagnosis Description L97.512 Non-pressure chronic ulcer of other part of right foot with fat Modifier: layer exposed Quantity: 1 Electronic Signature(s) Signed: 11/08/2017 10:27:37 AM By: Worthy Keeler PA-C Entered By: Worthy Keeler on 11/07/2017 16:58:30

## 2017-11-11 ENCOUNTER — Ambulatory Visit: Payer: Commercial Managed Care - PPO | Admitting: Physician Assistant

## 2017-11-14 ENCOUNTER — Encounter: Payer: BLUE CROSS/BLUE SHIELD | Admitting: Physician Assistant

## 2017-11-14 DIAGNOSIS — E11621 Type 2 diabetes mellitus with foot ulcer: Secondary | ICD-10-CM | POA: Diagnosis not present

## 2017-11-16 NOTE — Progress Notes (Signed)
ESSEX, PERRY ([id number]) Visit Report for 11/14/2017 Arrival Information Details Patient Name: [name], [name]. Date of Service: 11/14/2017 2:15 PM Medical Record Number: [id number] Patient Account Number: [id number] Date of Birth/Sex: 05-17-1961 (57 y.o. M) Treating RN: Roger Shelter Primary Care Michole Lecuyer: Lorelee Market Other Clinician: Referring Kyl Givler: Lorelee Market Treating Madisyn Mawhinney/Extender: Melburn Hake, HOYT Weeks in Treatment: 1 Visit Information History Since Last Visit All ordered tests and consults were completed: No Patient Arrived: Ambulatory Added or deleted any medications: No Arrival Time: 14:47 Any new allergies or adverse reactions: No Accompanied By: self Had a fall or experienced change in No Transfer Assistance: None activities of daily living that may affect Patient Identification Verified: Yes risk of falls: Secondary Verification Process Completed: Yes Signs or symptoms of abuse/neglect since last visito No Patient Requires Transmission-Based No Hospitalized since last visit: No Precautions: Implantable device outside of the clinic excluding No Patient Has Alerts: Yes cellular tissue based products placed in the center Patient Alerts: DM II since last visit: Pain Present Now: Yes Electronic Signature(s) Signed: 11/14/2017 4:25:58 PM By: Roger Shelter Entered By: Roger Shelter on 11/14/2017 14:49:24 [name] ([id number]) -------------------------------------------------------------------------------- Encounter Discharge Information Details Patient Name: [name]. Date of Service: 11/14/2017 2:15 PM Medical Record Number: [id number] Patient Account Number: [id number] Date of Birth/Sex: 22-Mar-1961 (57 y.o. M) Treating RN: Ahmed Prima Primary Care Lendora Keys: Lorelee Market Other Clinician: Referring Bryon Parker: Lorelee Market Treating Ka Bench/Extender: Melburn Hake, HOYT Weeks in Treatment: 1 Encounter  Discharge Information Items Discharge Condition: Stable Ambulatory Status: Ambulatory Discharge Destination: Home Transportation: Private Auto Accompanied By: self Schedule Follow-up Appointment: Yes Clinical Summary of Care: Electronic Signature(s) Signed: 11/14/2017 4:36:53 PM By: Alric Quan Entered By: Alric Quan on 11/14/2017 16:36:53 [name] ([id number]) -------------------------------------------------------------------------------- Lower Extremity Assessment Details Patient Name: [name]. Date of Service: 11/14/2017 2:15 PM Medical Record Number: [id number] Patient Account Number: [id number] Date of Birth/Sex: [birth year] (57 y.o. M) Treating RN: Roger Shelter Primary Care Elliana Bal: Lorelee Market Other Clinician: Referring Shaquela Weichert: Lorelee Market Treating Sheron Tallman/Extender: Melburn Hake, HOYT Weeks in Treatment: 1 Edema Assessment Assessed: [Left: No] [Right: No] Edema: [Left: Yes] [Right: Yes] Calf Left: Right: Point of Measurement: 36 cm From Medial Instep 36.5 cm 36.2 cm Ankle Left: Right: Point of Measurement: 11 cm From Medial Instep 25 cm 23.5 cm Vascular Assessment Claudication: Claudication Assessment [Left:None] [Right:None] Pulses: Dorsalis Pedis Palpable: [Left:Yes] [Right:Yes] Posterior Tibial Extremity colors, hair growth, and conditions: Extremity Color: [Left:Normal] [Right:Normal] Hair Growth on Extremity: [Left:No] [Right:No] Temperature of Extremity: [Left:Warm] [Right:Warm] Capillary Refill: [Left:< 3 seconds] [Right:< 3 seconds] Toe Nail Assessment Left: Right: Thick: Yes Yes Discolored: Yes Yes Deformed: Yes Yes Improper Length and Hygiene: Yes Yes Electronic Signature(s) Signed: 11/14/2017 4:25:58 PM By: Roger Shelter Entered By: Roger Shelter on 11/14/2017 15:04:28 [name] ([id number]) -------------------------------------------------------------------------------- Multi Wound Chart  Details Patient Name: [name]. Date of Service: 11/14/2017 2:15 PM Medical Record Number: [id number] Patient Account Number: [id number] Date of Birth/Sex: [birth year] (57 y.o. M) Treating RN: Montey Hora Primary Care Nagi Furio: Lorelee Market Other Clinician: Referring Morley Gaumer: Lorelee Market Treating Johncarlos Holtsclaw/Extender: Melburn Hake, HOYT Weeks in Treatment: 1 Vital Signs Height(in): 70 Pulse(bpm): 98 Weight(lbs): 265 Blood Pressure(mmHg): 145/68 Body Mass Index(BMI): 38 Temperature(F): 98.2 Respiratory Rate 18 (breaths/min): Photos: [12:No Photos] [13:No Photos] [N/A:N/A] Wound Location: [12:Left Foot - Lateral] [13:Right Metatarsal head first - Plantar] [N/A:N/A] Wounding Event: [12:Gradually Appeared] [13:Gradually Appeared] [N/A:N/A] Primary Etiology: [12:Diabetic Wound/Ulcer of the Lower Extremity] [13:Diabetic Wound/Ulcer of the Lower Extremity] [N/A:N/A] Comorbid  History: [12:Hypertension, Type II Diabetes, Neuropathy] [13:Hypertension, Type II Diabetes, Neuropathy] [N/A:N/A] Date Acquired: [12:10/24/2017] [13:10/24/2017] [N/A:N/A] Weeks of Treatment: [12:1] [13:1] [N/A:N/A] Wound Status: [12:Open] [13:Open] [N/A:N/A] Measurements L x W x D [12:1.3x1x0.2] [13:0.3x0.5x0.3] [N/A:N/A] (cm) Area (cm) : [12:1.021] [13:0.118] [N/A:N/A] Volume (cm) : [12:0.204] [13:0.035] [N/A:N/A] % Reduction in Area: [12:-8.40%] [13:-151.10%] [N/A:N/A] % Reduction in Volume: [12:-8.50%] [13:-150.00%] [N/A:N/A] Starting Position 1 [12:9] [13:7] (o'clock): Ending Position 1 [12:12] [13:12] (o'clock): Maximum Distance 1 (cm): [12:0.7] [13:0.3] Undermining: [12:Yes] [13:Yes] [N/A:N/A] Classification: [12:Grade 1] [13:Grade 1] [N/A:N/A] Exudate Amount: [12:Large] [13:Large] [N/A:N/A] Exudate Type: [12:Serosanguineous] [13:Serous] [N/A:N/A] Exudate Color: [12:red, brown] [13:amber] [N/A:N/A] Wound Margin: [12:Distinct, outline attached] [13:Distinct, outline attached]  [N/A:N/A] Granulation Amount: [12:Large (67-100%)] [13:Medium (34-66%)] [N/A:N/A] Granulation Quality: [12:Red] [13:Red, Pink] [N/A:N/A] Necrotic Amount: [12:Small (1-33%)] [13:Medium (34-66%)] [N/A:N/A] Necrotic Tissue: [12:Eschar, Adherent Slough] [13:Eschar, Adherent Slough] [N/A:N/A] Exposed Structures: [12:Fat Layer (Subcutaneous Tissue) Exposed: Yes Fascia: No Tendon: No Muscle: No] [13:Fat Layer (Subcutaneous Tissue) Exposed: Yes Fascia: No Tendon: No Muscle: No] [N/A:N/A] Joint: No Joint: No Bone: No Bone: No Epithelialization: None None N/A Periwound Skin Texture: Callus: Yes Callus: Yes N/A Excoriation: No Excoriation: No Induration: No Induration: No Crepitus: No Crepitus: No Rash: No Rash: No Scarring: No Scarring: No Periwound Skin Moisture: Maceration: No Maceration: No N/A Dry/Scaly: No Dry/Scaly: No Periwound Skin Color: Atrophie Blanche: No Atrophie Blanche: No N/A Cyanosis: No Cyanosis: No Ecchymosis: No Ecchymosis: No Erythema: No Erythema: No Hemosiderin Staining: No Hemosiderin Staining: No Mottled: No Mottled: No Pallor: No Pallor: No Rubor: No Rubor: No Temperature: No Abnormality No Abnormality N/A Tenderness on Palpation: Yes Yes N/A Wound Preparation: Ulcer Cleansing: Ulcer Cleansing: N/A Rinsed/Irrigated with Saline Rinsed/Irrigated with Saline Topical Anesthetic Applied: Topical Anesthetic Applied: Other: lidocaine 4% Other: lidocaine 4% Treatment Notes Electronic Signature(s) Signed: 11/14/2017 4:56:04 PM By: Montey Hora Entered By: Montey Hora on 11/14/2017 15:29:54 [name] ([id number]) -------------------------------------------------------------------------------- [place] Details Patient Name: [name], [name]. Date of Service: 11/14/2017 2:15 PM Medical Record Number: [id number] Patient Account Number: [id number] Date of Birth/Sex: 1960-07-11 (57 y.o. M) Treating RN: Montey Hora Primary Care Neylan Koroma: Lorelee Market Other Clinician: Referring Shannel Zahm: Lorelee Market Treating Ritchard Paragas/Extender: Melburn Hake, HOYT Weeks in Treatment: 1 Active Inactive ` Abuse / Safety / Falls / Self Care Management Nursing Diagnoses: Impaired physical mobility Goals: Patient will remain injury free related to falls Date Initiated: 11/07/2017 Target Resolution Date: 02/06/2018 Goal Status: Active Interventions: Assess fall risk on admission and as needed Notes: ` Orientation to the Wound Care Program Nursing Diagnoses: Knowledge deficit related to the wound healing center program Goals: Patient/caregiver will verbalize understanding of the [facility] Date Initiated: 11/07/2017 Target Resolution Date: 02/07/2018 Goal Status: Active Interventions: Provide education on orientation to the wound center Notes: ` Wound/Skin Impairment Nursing Diagnoses: Impaired tissue integrity Goals: Ulcer/skin breakdown will heal within 14 weeks Date Initiated: 11/07/2017 Target Resolution Date: 02/06/2018 Goal Status: Active Interventions: OTTIE, TILLERY ([id number]) Assess patient/caregiver ability to obtain necessary supplies Assess patient/caregiver ability to perform ulcer/skin care regimen upon admission and as needed Assess ulceration(s) every visit Notes: Electronic Signature(s) Signed: 11/14/2017 4:56:04 PM By: Montey Hora Entered By: Montey Hora on 11/14/2017 15:29:46 [name] ([id number]) -------------------------------------------------------------------------------- Pain Assessment Details Patient Name: [name]. Date of Service: 11/14/2017 2:15 PM Medical Record Number: [id number] Patient Account Number: [id number] Date of Birth/Sex: [birth year] (57 y.o. M) Treating RN: Roger Shelter Primary Care Charnika Herbst: Lorelee Market Other Clinician: Referring Christabell Loseke: Lorelee Market Treating Blanch Stang/Extender: Joaquim Lai  III,  HOYT Weeks in Treatment: 1 Active Problems Location of Pain Severity and Description of Pain Patient Has Paino Yes Site Locations Duration of the Pain. Constant / Intermittento Constant Rate the pain. Current Pain Level: 10 Character of Pain Describe the Pain: Aching, Burning, Other: stinging Pain Management and Medication Current Pain Management: Electronic Signature(s) Signed: 11/14/2017 4:25:58 PM By: Roger Shelter Entered By: Roger Shelter on 11/14/2017 14:49:47 [name] ([id number]) -------------------------------------------------------------------------------- Patient/Caregiver Education Details Patient Name: [name]. Date of Service: 11/14/2017 2:15 PM Medical Record Number: [id number] Patient Account Number: [id number] Date of Birth/Gender: 20-Apr-1961 (57 y.o. M) Treating RN: Ahmed Prima Primary Care Physician: Lorelee Market Other Clinician: Referring Physician: Lorelee Market Treating Physician/Extender: Sharalyn Ink in Treatment: 1 Education Assessment Education Provided To: Patient Education Topics Provided Wound/Skin Impairment: Handouts: Caring for Your Ulcer, Skin Care Do's and Dont's, Other: change dressing as ordered Methods: Demonstration, Explain/Verbal Responses: State content correctly Electronic Signature(s) Signed: 11/14/2017 4:38:26 PM By: Alric Quan Entered By: Alric Quan on 11/14/2017 16:37:13 [name] ([id number]) -------------------------------------------------------------------------------- Wound Assessment Details Patient Name: [name]. Date of Service: 11/14/2017 2:15 PM Medical Record Number: [id number] Patient Account Number: [id number] Date of Birth/Sex: September 30, 1960 (57 y.o. M) Treating RN: Roger Shelter Primary Care Agamjot Kilgallon: Lorelee Market Other Clinician: Referring Khalel Alms: Lorelee Market Treating Alizon Schmeling/Extender: Melburn Hake, HOYT Weeks in Treatment:  1 Wound Status Wound Number: 12 Primary Etiology: Diabetic Wound/Ulcer of the Lower Extremity Wound Location: Left Foot - Lateral Wound Status: Open Wounding Event: Gradually Appeared Comorbid Hypertension, Type II Diabetes, Date Acquired: 10/24/2017 History: Neuropathy Weeks Of Treatment: 1 Clustered Wound: No Photos Photo Uploaded By: Roger Shelter on 11/14/2017 16:40:54 Wound Measurements Length: (cm) 1.3 Width: (cm) 1 Depth: (cm) 0.2 Area: (cm) 1.021 Volume: (cm) 0.204 % Reduction in Area: -8.4% % Reduction in Volume: -8.5% Epithelialization: None Undermining: Yes Starting Position (o'clock): 9 Ending Position (o'clock): 12 Maximum Distance: (cm) 0.7 Wound Description Classification: Grade 1 Wound Margin: Distinct, outline attached Exudate Amount: Large Exudate Type: Serosanguineous Exudate Color: red, brown Foul Odor After Cleansing: No Slough/Fibrino Yes Wound Bed Granulation Amount: Large (67-100%) Exposed Structure Granulation Quality: Red Fascia Exposed: No Necrotic Amount: Small (1-33%) Fat Layer (Subcutaneous Tissue) Exposed: Yes Necrotic Quality: Eschar, Adherent Slough Tendon Exposed: No Muscle Exposed: No Joint Exposed: No [name], SVEC. ([id number]) Bone Exposed: No Periwound Skin Texture Texture Color No Abnormalities Noted: No No Abnormalities Noted: No Callus: Yes Atrophie Blanche: No Crepitus: No Cyanosis: No Excoriation: No Ecchymosis: No Induration: No Erythema: No Rash: No Hemosiderin Staining: No Scarring: No Mottled: No Pallor: No Moisture Rubor: No No Abnormalities Noted: No Dry / Scaly: No Temperature / Pain Maceration: No Temperature: No Abnormality Tenderness on Palpation: Yes Wound Preparation Ulcer Cleansing: Rinsed/Irrigated with Saline Topical Anesthetic Applied: Other: lidocaine 4%, Treatment Notes Wound #12 (Left, Lateral Foot) 1. Cleansed with: Clean wound with Normal Saline 2.  Anesthetic Topical Lidocaine 4% cream to wound bed prior to debridement 4. Dressing Applied: Other dressing (specify in notes) 5. Secondary Dressing Applied Dry Gauze Foam Kerlix/Conform 7. Secured with Tape Notes kerlix and coban, silvercel Electronic Signature(s) Signed: 11/14/2017 4:25:58 PM By: Roger Shelter Entered By: Roger Shelter on 11/14/2017 14:57:45 [name] ([id number]) -------------------------------------------------------------------------------- Wound Assessment Details Patient Name: [name]. Date of Service: 11/14/2017 2:15 PM Medical Record Number: [id number] Patient Account Number: [id number] Date of Birth/Sex: 1960/12/10 (57 y.o. M) Treating RN: Roger Shelter Primary Care Arryn Terrones: Lorelee Market Other Clinician: Referring Micaila Ziemba: Lorelee Market Treating  Jais Demir/Extender: Melburn Hake, HOYT Weeks in Treatment: 1 Wound Status Wound Number: 13 Primary Etiology: Diabetic Wound/Ulcer of the Lower Extremity Wound Location: Right Metatarsal head first - Plantar Wound Status: Open Wounding Event: Gradually Appeared Comorbid Hypertension, Type II Diabetes, Date Acquired: 10/24/2017 History: Neuropathy Weeks Of Treatment: 1 Clustered Wound: No Photos Photo Uploaded By: Roger Shelter on 11/14/2017 16:40:55 Wound Measurements Length: (cm) 0.3 Width: (cm) 0.5 Depth: (cm) 0.3 Area: (cm) 0.118 Volume: (cm) 0.035 % Reduction in Area: -151.1% % Reduction in Volume: -150% Epithelialization: None Undermining: Yes Starting Position (o'clock): 7 Ending Position (o'clock): 12 Maximum Distance: (cm) 0.3 Wound Description Classification: Grade 1 Wound Margin: Distinct, outline attached Exudate Amount: Large Exudate Type: Serous Exudate Color: amber Foul Odor After Cleansing: No Slough/Fibrino Yes Wound Bed Granulation Amount: Medium (34-66%) Exposed Structure Granulation Quality: Red, Pink Fascia Exposed: No Necrotic  Amount: Medium (34-66%) Fat Layer (Subcutaneous Tissue) Exposed: Yes Necrotic Quality: Eschar, Adherent Slough Tendon Exposed: No Muscle Exposed: No Joint Exposed: No [name], BRING. ([id number]) Bone Exposed: No Periwound Skin Texture Texture Color No Abnormalities Noted: No No Abnormalities Noted: No Callus: Yes Atrophie Blanche: No Crepitus: No Cyanosis: No Excoriation: No Ecchymosis: No Induration: No Erythema: No Rash: No Hemosiderin Staining: No Scarring: No Mottled: No Pallor: No Moisture Rubor: No No Abnormalities Noted: No Dry / Scaly: No Temperature / Pain Maceration: No Temperature: No Abnormality Tenderness on Palpation: Yes Wound Preparation Ulcer Cleansing: Rinsed/Irrigated with Saline Topical Anesthetic Applied: Other: lidocaine 4%, Treatment Notes Wound #13 (Right, Plantar Metatarsal head first) 1. Cleansed with: Clean wound with Normal Saline 2. Anesthetic Topical Lidocaine 4% cream to wound bed prior to debridement 4. Dressing Applied: Iodoflex 5. Secondary Dressing Applied Dry Gauze Foam Kerlix/Conform 7. Secured with Tape Notes kerlix and Event organiser) Signed: 11/14/2017 4:25:58 PM By: Roger Shelter Entered By: Roger Shelter on 11/14/2017 15:00:12 [name] ([id number]) -------------------------------------------------------------------------------- [name] Details Patient Name: [name]. Date of Service: 11/14/2017 2:15 PM Medical Record Number: [id number] Patient Account Number: [id number] Date of Birth/Sex: [birth year] (57 y.o. M) Treating RN: Roger Shelter Primary Care Orhan Mayorga: Lorelee Market Other Clinician: Referring Shylyn Younce: Lorelee Market Treating Javien Tesch/Extender: Melburn Hake, HOYT Weeks in Treatment: 1 Vital Signs Time Taken: 14:49 Temperature (F): 98.2 Height (in): 70 Pulse (bpm): 98 Weight (lbs): 265 Respiratory Rate (breaths/min): 18 Body Mass Index (BMI): 38 Blood  Pressure (mmHg): 145/68 Reference Range: 80 - 120 mg / dl Electronic Signature(s) Signed: 11/14/2017 4:25:58 PM By: Roger Shelter Entered By: Roger Shelter on 11/14/2017 14:50:08

## 2017-11-18 NOTE — Progress Notes (Signed)
FILEMON, BRETON ([id number]) Visit Report for 11/14/2017 Chief Complaint Document Details Patient Name: MANUEL, DALL. Date of Service: 11/14/2017 2:15 PM Medical Record Number: [id number] Patient Account Number: [id number] Date of Birth/Sex: 03-22-1961 (57 y.o. M) Treating RN: Montey Hora Primary Care Provider: Lorelee Market Other Clinician: Referring Provider: Lorelee Market Treating Provider/Extender: Melburn Hake, Lajoya Dombek Weeks in Treatment: 1 Information Obtained from: Patient Chief Complaint Bilateral foot ulcers Electronic Signature(s) Signed: 11/15/2017 1:18:07 PM By: Worthy Keeler PA-C Entered By: Worthy Keeler on 11/14/2017 14:46:02 [name] ([id number]) -------------------------------------------------------------------------------- Debridement Details Patient Name: [name]. Date of Service: 11/14/2017 2:15 PM Medical Record Number: [id number] Patient Account Number: [id number] Date of Birth/Sex: 01-01-1961 (57 y.o. M) Treating RN: Montey Hora Primary Care Provider: Lorelee Market Other Clinician: Referring Provider: Lorelee Market Treating Provider/Extender: Melburn Hake, Antonya Leeder Weeks in Treatment: 1 Debridement Performed for Wound #12 Left,Lateral Foot Assessment: Performed By: Physician STONE III, Normalee Sistare E., PA-C Debridement Type: Debridement Severity of Tissue Pre Fat layer exposed Debridement: Pre-procedure Verification/Time Yes - 15:39 Out Taken: Start Time: 15:39 Pain Control: Lidocaine 4% Topical Solution Total Area Debrided (L x W): 1.3 (cm) x 1 (cm) = 1.3 (cm) Tissue and other material Non-Viable, Callus debrided: Level: Non-Viable Tissue Debridement Description: Selective/Open Wound Instrument: Curette, Forceps, Scissors Bleeding: Minimum Hemostasis Achieved: Pressure End Time: 15:48 Procedural Pain: 0 Post Procedural Pain: 0 Response to Treatment: Procedure was tolerated well Level of Consciousness: Awake and  Alert Post Procedure Vitals: Temperature: 98.2 Pulse: 98 Respiratory Rate: 18 Blood Pressure: Systolic Blood Pressure: 160 Diastolic Blood Pressure: 68 Post Debridement Measurements of Total Wound Length: (cm) 1.3 Width: (cm) 1 Depth: (cm) 0.2 Volume: (cm) 0.204 Character of Wound/Ulcer Post Debridement: Improved Severity of Tissue Post Debridement: Fat layer exposed Post Procedure Diagnosis Same as Pre-procedure Electronic Signature(s) Signed: 11/14/2017 4:56:04 PM By: Montey Hora Signed: 11/15/2017 1:18:07 PM By: Vic Blackbird, [name] ([id number]) Entered By: Montey Hora on 11/14/2017 15:48:34 [name] ([id number]) -------------------------------------------------------------------------------- Debridement Details Patient Name: [name]. Date of Service: 11/14/2017 2:15 PM Medical Record Number: [id number] Patient Account Number: [id number] Date of Birth/Sex: 21-Jul-1960 (57 y.o. M) Treating RN: Montey Hora Primary Care Provider: Lorelee Market Other Clinician: Referring Provider: Lorelee Market Treating Provider/Extender: Melburn Hake, Jaquell Seddon Weeks in Treatment: 1 Debridement Performed for Wound #13 Right,Plantar Metatarsal head first Assessment: Performed By: Physician STONE III, Weslie Pretlow E., PA-C Debridement Type: Debridement Severity of Tissue Pre Fat layer exposed Debridement: Pre-procedure Verification/Time Yes - 15:34 Out Taken: Start Time: 15:34 Pain Control: Lidocaine 4% Topical Solution Total Area Debrided (L x W): 0.3 (cm) x 0.5 (cm) = 0.15 (cm) Tissue and other material Viable, Non-Viable, Callus, Slough, Subcutaneous, Slough debrided: Level: Skin/Subcutaneous Tissue Debridement Description: Excisional Instrument: Curette, Forceps, Scissors Bleeding: Minimum Hemostasis Achieved: Pressure End Time: 15:39 Procedural Pain: 0 Post Procedural Pain: 0 Response to Treatment: Procedure was tolerated well Level of  Consciousness: Awake and Alert Post Procedure Vitals: Temperature: 98.2 Pulse: 98 Respiratory Rate: 18 Blood Pressure: Systolic Blood Pressure: 993 Diastolic Blood Pressure: 68 Post Debridement Measurements of Total Wound Length: (cm) 3.5 Width: (cm) 1.7 Depth: (cm) 0.2 Volume: (cm) 0.935 Character of Wound/Ulcer Post Debridement: Improved Severity of Tissue Post Debridement: Fat layer exposed Post Procedure Diagnosis Same as Pre-procedure Electronic Signature(s) Signed: 11/14/2017 4:56:04 PM By: Montey Hora Signed: 11/15/2017 1:18:07 PM By: Vic Blackbird, [name] ([id number]) Entered By: Montey Hora on 11/14/2017 15:50:02 [name] ([id number]) -------------------------------------------------------------------------------- HPI Details Patient Name:  Sherian Rein F. Date of Service: 11/14/2017 2:15 PM Medical Record Number: [id number] Patient Account Number: [id number] Date of Birth/Sex: Nov 14, 1960 (57 y.o. M) Treating RN: Montey Hora Primary Care Provider: Lorelee Market Other Clinician: Referring Provider: Lorelee Market Treating Provider/Extender: Melburn Hake, Sherria Riemann Weeks in Treatment: 1 History of Present Illness HPI Description: Pleasant 57 year old with history of diabetes (Hgb A1c 10.8 in 2014) and peripheral neuropathy. No PVD. L ABI 1.1. Status post right great toe partial amputation years ago. He was at work and on 10/22/2014, was injured by a cart, and suffered an ulceration to his left anterior calf. He says that it subsequently became infected, and he was treated with a course of antibiotics. He was found on initial exam to have an ulceration on the dorsum of his left third toe. He was unaware of this and attributes it to pressure from his steel toed boots. More recently he injured his right anterior calf on a cart. Ambulating normally per his baseline. He has been undergoing regular debridements, applying mupirocin cream, and an  Ace wrap for edema control. He returns to clinic for follow-up and is without complaints. No pain. No fever or chills. No drainage. 10/25/15; this is a 57 year old man who has type II diabetes with diabetic polyneuropathy. He tells me that he fractured his left fifth metatarsal in June 2016 when he presented with swelling. He does not recall a specific injury. His hemoglobin A1c was apparently too high at the time for consideration of surgery and he was put in some form of offloading. Ultimately he went to surgery in December with an allograft from his calcaneus to this site, plate and screws. He had an x-ray of the foot in March that showed concerns about nonunion. He tells me that in March he had to move and basically moved himself. He was on his foot a lot and then noticed some drainage from an open area. He has been following with his orthopedic surgeon Dr. Doran Durand. He has been applying a felt donut, dry dressing and using his heel healing sandal. 11/01/15; this is a patient I saw last week for the first time. He had a small open wound on the plantar aspect of his left foot at roughly the level of the base of his fifth metatarsal. He had a considerable degree of thickened skin around this wound on the plantar aspect which I thought was from chronic pressure on this area. He tells [place] that he had drainage over the course of the week. No systemic symptoms. 11/08/15; culture last week grew Citrobacter korseri. This should've been sensitive to the Augmentin I gave him. He has seen Dr. Doran Durand who did his initial surgery and according the patient the plan is to give this another month and then the hardware might need to come out of this. This seems like a reasonable plan. I will adjust his antibiotics to ciprofloxacin which probably should continue for at least another 2 weeks. I gave him 10 days worth today 11/22/15 the patient has completed antibiotics. He has an appointment with Dr. Doran Durand this Friday.  There is improved dimensions around the wound on the left fifth metatarsal base 11/29/15; the patient has completed antibiotics last week. Apparently his appointment with Dr. Doran Durand it is not until this Friday. Dimensions are roughly the same. 12/06/15; saw Dr. Doran Durand. No x-ray told the end of the month, next appointment June 30. We have been using Aquacel Ag 12/13/15: No major change this week. Using Aquacel AG 621/17; arrived this  week with maceration around the wound. There was quite a bit of undermining which required surgical debridement. I changed him to [facility] last week, by the patient's admission he was up on this more this week 12/27/15; macerated tissue around the wound is removed with a scalpel and pickups. There is no undermining. Nonviable subcutaneous tissue and skin taken from the superior circumference of the wound is slough from the surface. READMISSION 03/06/16 since I last saw this patient at the end of June, he went for surgery on 01/11/16 by Dr. Doran Durand of orthopedics. He had a left foot irrigation and debridement, removal of hardware and placement of wound VAC. He is also been followed by Dr. Megan Salon of infectious disease and completed a six-week course of IV Rocephin for group B strep and Enterobacter in the bone at the time of surgery. Apparently at the time of surgery the bone looked healthy so I don't think any bone was actually removed. He has been using silver alginate based dressings on the same wound area at the base of the left fifth metatarsal on the left. I note that he is also had arterial studies on 01/08/16, these showed a left ABI of 1.2 to and a right ABI of 1.3. Waveforms were listed as biphasic. He was not felt to have any specific arterial issues. 03/13/16; no real change in the condition of the wound at the left lateral foot at roughly the base of his fifth metatarsal. Use silver alginate last week. DAMARRION, MIMBS ([id number]) 03/18/16 arrives today with  no open area. Being suspicious of the overlying callus I. Some of this back although I see nothing but covering tissue here/epithelium. There is no surrounding tenderness READMISSION 04/16/16 this is a patient I discharged about a month ago. Initially a surgical wound on the lateral aspect of his left foot which subsequently became infected. The story he is giving today that he went back into his own modified shoe started to notice pain 2 weeks ago he was seen in Dr. Nona Dell office by a physician assistant last Wednesday and according the patient was told that everything looked fine however this is clearly now broken open and he has an open wound in the same spot that we have been dealing with repetitively. Situation is complicated by the fact that he is running short of money on long-term disability. He has not taken his insulin and at least 2 weeks was previously on NovoLog short acting insulin on a sliding scale and TRESIBA 35 units at bedtime. He is no longer able to afford any of his medication he was in the x-ray on 10/15. A plain x-ray showed healed fracture of the left fifth metatarsal bone status post removal of the associated plate and screw fixation hardware. There was no acute appearing osseous abnormality. His blood work showed a white count of 9.2 with an essentially normal differential comprehensive metabolic panel was normal. Previous CT scan of the foot on 01/08/16 showed no osteomyelitis previous vascular workup showed no evidence of significant PAD on 01/13/16 04/23/16; culture I did last week grew enterococcus [ampicillin sensitive] and MRSA. He saw infectious disease yesterday. They stop the clindamycin and ordered an MRI. This is not unreasonable. All the hardware is out of the foot at this point. 04/30/16 at this point in time we are still awaiting the results of the MRI at this point in time. Patient did have an area which appears to be somewhat macerated in the proximal  portion of the  wound where there is overlying necrotic skin that is doing nothing more than trapping fluid underneath. He continues to state that he does have some discomfort and again the concern is for the possibility of osteomyelitis hence the reason for the MRI order. We have been using a silver alginate dressing but again I think the reason this with his macerated as it was is that the dressing obscene could not reach the entirety of the wound due to some necrotic skin covering the proximal portion. No bleeding noted at this point in time on initial evaluation. 05/07/16 we receive the results of patient's MRI which shows that he has no evidence of osteomyelitis. This obviously is excellent news. Patient was definitely happy to hear this. He tells me the wound appears to be doing very well at this point in time and is pleased with the progress currently. 05/15/16; as noted the patient did not have osteomyelitis. He has been released by infectious disease and orthopedics. His wound is still open he had a debridement last week but complain when he got home he "bleeding for half a day". He is not had any pain. We have been using silver alginate with Kerlix gauze wrap. 05/28/16; patient arrives today with the wound in much the same condition as last time. He has a small opening on the lateral aspect of his foot however with debridement there is clear undermining medially there is no real evidence of infection here and I didn't see any point in culturing this. One would have to wonder if this isn't a simple matter of in adequate offloading as he is been using a healing sandal. 06/05/16; the open area is now on the plantar aspect of his foot and not decide. The wound almost appears to have "migrated". This was the term use by our intake nurse. 06/12/16; open area on the plantar aspect of his foot. Base of the wound looks very healthy. This will be his second week in a total contact cast 06/19/16;  patient arrived today in a total contact cast. There was some expectation from our staff and myself that this area would be healed. Unfortunately the area was boggy and with rec pressure a fairly substantial amount of purulent drainage was obtained. Specimen obtained for culture. The patient had no complaints of systemic problems including fever or chills or instability of his diabetes. There was no pain in the foot. Nevertheless a extensive debridement was required. 06/26/16; patient's culture from the abscess last week grew a combination of MRSA and ampicillin sensitive enterococcus. I had him on Augmentin and Septra however I have elected to give him a full 10 day course of Zyvox instead as I Recently treated this combination of organisms with Augmentin and Septra before. Arrives today with no systemic symptoms 07/03/16; the patient has 2 more treatments of Zyvox and then he is finished antibiotics the wound has improved now mostly on the lateral aspect of his foot. There is still some tenderness when he walks. 07/10/16; patient arrives today with Zyvox completed. He only has a small open area remaining. 07/24/16; she arrives here with no overt open area. The covering is thick callus/eschar. Nevertheless there is no open area here. He has some tenderness underneath the area but no overt infection is observed no drainage. The patient has a deformity in the foot with this area weekly to be exposed to more pressure in his foot where nevertheless that something we are going to have to deal with going forward. The patient  has diabetic workboots and diabetic shoes. He has had a long difficult course with the area here. This started as a fracture at work. He had bone grafting from his calcaneus and screws. This got infected he had to have more surgery on the area. Bone at the time of that surgery I think showed enterococcus and group B strep. He had 6 weeks of Rocephin. Since then the area has waxed and  waned in its difficulty. Recently he had an MRI in December that did not show osteomyelitis nevertheless he had an abscess that grew MRSA and enterococcus which I elected to treat with Zyvox. This was in December and this wound is actually "healed" over JHADEN, PIZZUTO ([id number]) 08/21/16; the patient came in today for his one-month follow-up visit. The area on the lateral aspect of his left foot looks much the same as some month ago. There is no evidence of an open wound here. However the patient tells me about a week after he went back to work he developed severe pain and swelling in the plantar aspect of his right foot first and fifth metatarsal heads. 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 He went to see his podiatrist at [facility]. She told him that he could not work on his feet. She told him to go back and his cam walking boot on the left. Not have open wounds obviously on the right. The patient is actually gone ahead and retired from his job because he does not feel he can work on his feet 09/18/16; patient comes back in saying he recently had some pain on the lateral aspect of his left foot. Asked that we look at this. Other than that all of his wounds have a viable surface. He has diabetic shoes. He is retired from work. READMISSION Eliyohu Class is a 57 year old man who we cared for for a prolonged period of time in late 2017 into March 2018. At that point he had suffered a fracture of his left fifth metatarsal at about the level of the base of the fifth metatarsal. He had surgical repair by Dr. Meryl Dare and he developed a very refractory wound over the fifth metatarsal. Ultimately this became infected he required hardware removal this eventually closed over and we discharged from the clinic in March of this year. He tells me as well until roughly 3 weeks ago when he developed increasing pain in the  same area making it difficult for him to sleep. He was admitted to hospital from 9/5 through 9/7 with now an ulcer in the same area on the lateral aspect of his left foot at the level of the base of the metatarsal. X-rays and MRIs during this hospitalization were negative. Wound culture showed a combination of Escherichia coli, Morganella and Pseudomonas. He was given IV Cipro and vancomycin the hospital and then discharged home on Cipro and Doxy. He returned to the ER on 9/16 with leg swelling and discomfort. I don't think anything was added at that point although he did have an ultrasound of the left leg that was negative for DVT. He was back in the ER on 10/4 again with pain in the area he was given Bactrim but states he had a significant allergic reaction to that which has abated once he stopped the Bactrim. I don't think he had a full course. He went to see his podiatrist yesterday who recommended some  form of foot soak. He has a Darco forefoot offloading boot The patient is a type II diabetic on insulin. Tells me his recent hemoglobin A1c was 7. Arterial studies done in July 2017 showed an ABI in the right of 1.3 on the left 1.2 to biphasic waveforms bilaterally. He was not felt to have arterial disease. As noted the patient has had 2 MRIs most recently on 10/4 this showed no evidence of an abscess or osteomyelitis and no change since the prior study of 03/05/17 there was nonspecific edema on the dorsum of the foot. ABIs in this clinic today 1.2 which would be unchanged from his study done in July/17 04/15/17; now down to 1 small wound on the left lateral foot. We wrapped him and applied collagen last week and the foot is fairly macerated. 04/22/17; small wound on the left lateral foot however it has some undermining. Using collagen 04/29/17; small wound on the left lateral foot some surrounding callus. Chronic damage in this area as a result of initial fracture nonhealing and secondary  infection. 05/06/17; the patient arrives today with the area on the left lateral foot closed. There is thick subcutaneous tissue with a layer of callus. I took some of the callus off just to ensure adequate closure of the underlying tissue and there is no open wound here. He has considerable deformity of this area of his foot however and unless there is an ability to offload this I think opening is going to be recurrent. He tells me he has diabetic foot wear although I have not actually seen this Readmission: 09/02/17 on evaluation today patient appears to be doing a little better compared to what he tells me what's going on in regard to his left lateral foot. He was previously seen in the ER this past Saturday it is now Tuesday. On Saturday he actually was treated with antibiotics including Cipro 500 mg two times a day and doxycycline 100 mg two times a day. Subsequently he also did have an x-ray performed of his left foot which revealed increased degenerative changes of the base of the fifth metatarsal since comparison in 2018. There was no radiographic evidence of osteomyelitis however. Patient has previously had an MRI of the left foot which was performed on 04/03/17 in this revealed at that point a soft tissue ulceration but no evidence of osteomyelitis. Patient has previously undergone testing in regard to his ABI's by Dr. Bridgett Larsson and this showed that he did have ABI was within normal limits which does correspond with ABI's we checked here in the office today. That study was on 01/13/16. With all that being said on evaluation today there does not appear to be any evidence of a true open wound of the left lateral fifth metatarsal region. He does have tenderness noted as well is a little bit of boggy/fluctuance feeling to the area in this region of his foot and there is definitely a very solid and firm callous noted laterally. With that being said I am not able to find any obvious opening at this point  there does appear to be a spot where there appears to been a opening very recently however patient states he has not noted any drainage from this currently. He does however have discomfort with palpation of this area this is in the 3-4/10 range only with very firm palpation this is not too significant at all. Subsequently he does have a small ulcer at the medial nail bed of the right second toe  where he inadvertently pulled off a portion of the toenail softly causing this injury. Otherwise there does not appear to be any evidence of infection in regard to this right second toe. CAMBRIDGE, DELEO ([id number]) 09/23/17 on evaluation today patient actually appears to be doing well he has no open ulcerations noted at this point. With that being said he did have significant callous noted over the left lateral foot which did require callous pairing today he tolerated this without complication. Readmission: 11/07/17 patient presents today for readmission concerning to ulcers that he has. One on the right plantar fifth metatarsal region and the other on the left plantar fifth metatarsal region. He has previously had an x-ray which was performed on 11/05/17 and revealed that the patient had wounds noted of the base of the fifth metatarsal region without evidence of destruction to the bone to suggest osteomyelitis. Obviously this is excellent news. With that being said he does have a significantly large ulcer that extensively plantar surface of the wound and is concerning for the fact that he continues to walk on this due to the patient telling me that he "has to work" I do believe this has likely led to both the opening of the wound as well as the fact that is not really able to heal very well at this point. He has been seen by his podiatrist and apparently according to the note dated 10/23/17 the patient was provided with Santyl and recommended a dry dressing. No wound culture has been obtained at this point. As  stated above he has had returned to work due to financial reasons and is not able to wear his postop shoes due to the fact that he cannot wear this and work. The wound is stated to have been present for "several weeks" prior to the visit with the podiatrist on 10/23/17. With that being said again at least the good news is there does not appear to be any evidence of osteomyelitis. 11/14/17 on evaluation today patient appears to be doing poorly in regard to his bilateral foot ulcers. Unfortunately he seems to have significant callous with a lot of maceration underlined especially on the right even more so than the left. He has been tolerating the dressing changes without complication. With that being said overall I do think he's gonna have to have a significant debridement today. Electronic Signature(s) Signed: 11/15/2017 1:18:07 PM By: Worthy Keeler PA-C Entered By: Worthy Keeler on 11/15/2017 13:04:09 [name] ([id number]) -------------------------------------------------------------------------------- Physical Exam Details Patient Name: XAYDEN, LINSEY. Date of Service: 11/14/2017 2:15 PM Medical Record Number: [id number] Patient Account Number: [id number] Date of Birth/Sex: [birth year] (57 y.o. M) Treating RN: Montey Hora Primary Care Provider: Lorelee Market Other Clinician: Referring Provider: Lorelee Market Treating Provider/Extender: Melburn Hake, Luciano Cinquemani Weeks in Treatment: 1 Constitutional Obese and well-hydrated in no acute distress. Respiratory normal breathing without difficulty. Psychiatric this patient is able to make decisions and demonstrates good insight into disease process. Alert and Oriented x 3. pleasant and cooperative. Notes Patient's right lateral foot wound did require significant debridement to remove callous which was overlying an area of ulceration there was a lot of new skin growth but there was also an ulcer which extended in a very jagged  pattern underneath the callous and this had been causing significant maceration fluid buildup obviously the wound was not gonna heal in this manner. The callous on the left also was removed to clean away from the wound bed so will not continue  to become macerated. Patient tolerated this today without complication and post debridement things appear to be doing significantly better which is good news. No fevers, chills, nausea, or vomiting noted at this time. Electronic Signature(s) Signed: 11/15/2017 1:18:07 PM By: Worthy Keeler PA-C Entered By: Worthy Keeler on 11/15/2017 13:05:06 [name] ([id number]) -------------------------------------------------------------------------------- Physician Orders Details Patient Name: YISROEL, MULLENDORE. Date of Service: 11/14/2017 2:15 PM Medical Record Number: [id number] Patient Account Number: [id number] Date of Birth/Sex: Jul 08, 1960 (57 y.o. 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 Wound #13 Right,Plantar Metatarsal head first o May Shower, gently pat wound dry prior to applying new dressing. Anesthetic (add to Medication List) Wound #12 Left,Lateral Foot o Topical Lidocaine 4% cream applied to wound bed prior to debridement (In Clinic Only). Wound #13 Right,Plantar Metatarsal head first o Topical Lidocaine 4% cream applied to wound bed prior to debridement (In Clinic  Only). Primary Wound Dressing Wound #12 Left,Lateral Foot o Silver Alginate Wound #13 Right,Plantar Metatarsal head first o Iodoflex Secondary Dressing Wound #12 Left,Lateral Foot o Gauze, ABD and Kerlix/Conform - offload with foam or felt Wound #13 Right,Plantar Metatarsal head first o Gauze, ABD and Kerlix/Conform - offload with foam or felt Dressing Change Frequency Wound #12 Left,Lateral Foot o Change dressing every day. Wound #13 Right,Plantar Metatarsal head first o Change dressing every day. Follow-up Appointments ELMER, MERWIN ([id number]) Wound #12 Left,Lateral Foot o Return Appointment in 1 week. Wound #13 Right,Plantar Metatarsal head first o Return Appointment in 1 week. Additional Orders / Instructions Wound #12 Left,Lateral Foot o Increase protein intake. Wound #13 Right,Plantar Metatarsal head first o Increase protein intake. Patient Medications Allergies: Bactrim Notifications Medication Indication Start End Augmentin 11/14/2017 DOSE 1 - oral 875 mg-125 mg tablet - 1 tablet oral taken 2 times a day for 10 days Electronic Signature(s) Signed: 11/14/2017 3:50:54 PM By: Worthy Keeler PA-C Entered By: Worthy Keeler on 11/14/2017 15:50:53 [name] ([id number]) -------------------------------------------------------------------------------- Problem List Details Patient Name: [name]. Date of Service: 11/14/2017 2:15 PM Medical Record Number: [id number] Patient Account Number: [id number] Date of Birth/Sex: 10/15/1960 (57 y.o. M) Treating RN: Montey Hora Primary Care Provider: Lorelee Market Other Clinician: Referring Provider: Lorelee Market Treating Provider/Extender: Melburn Hake, Kerissa Coia Weeks in Treatment: 1 Active Problems ICD-10 Impacting Encounter Code Description Active Date Wound Healing Diagnosis E11.621 Type 2 diabetes mellitus with foot ulcer 11/07/2017 Yes L97.522 Non-pressure chronic ulcer of other  part of left foot with fat 11/07/2017 Yes layer exposed L97.512 Non-pressure chronic ulcer of other part of right foot with fat 11/07/2017 Yes layer exposed Inactive Problems Resolved Problems Electronic Signature(s) Signed: 11/15/2017 1:18:07 PM By: Worthy Keeler PA-C Entered By: Worthy Keeler on 11/14/2017 14:45:56 [name] ([id number]) -------------------------------------------------------------------------------- Progress Note/History and Physical Details Patient Name: [name]. Date of Service: 11/14/2017 2:15 PM Medical Record Number: [id number] Patient Account Number: [id number] Date of Birth/Sex: 1960/07/17 (57 y.o. M) Treating RN: Montey Hora Primary Care Provider: Lorelee Market Other Clinician: Referring Provider: Brunetta Genera,  Meindert Treating Provider/Extender: STONE III, Affie Gasner Weeks in Treatment: 1 Subjective Chief Complaint Information obtained from Patient Bilateral foot ulcers History of Present Illness (HPI) Pleasant 57 year old with history of diabetes (Hgb A1c 10.8 in 2014) and peripheral neuropathy. No PVD. L ABI 1.1. Status post right great toe partial amputation years ago. He was at work and on 10/22/2014, was injured by a cart, and suffered an ulceration to his left anterior calf. He says that it subsequently became infected, and he was treated with a course of antibiotics. He was found on initial exam to have an ulceration on the dorsum of his left third toe. He was unaware of this and attributes it to pressure from his steel toed boots. More recently he injured his right anterior calf on a cart. Ambulating normally per his baseline. He has been undergoing regular debridements, applying mupirocin cream, and an Ace wrap for edema control. He returns to clinic for follow-up and is without complaints. No pain. No fever or chills. No drainage. 10/25/15; this is a 57 year old man who has type II diabetes with diabetic polyneuropathy. He tells me  that he fractured his left fifth metatarsal in June 2016 when he presented with swelling. He does not recall a specific injury. His hemoglobin A1c was apparently too high at the time for consideration of surgery and he was put in some form of offloading. Ultimately he went to surgery in December with an allograft from his calcaneus to this site, plate and screws. He had an x-ray of the foot in March that showed concerns about nonunion. He tells me that in March he had to move and basically moved himself. He was on his foot a lot and then noticed some drainage from an open area. He has been following with his orthopedic surgeon Dr. Doran Durand. He has been applying a felt donut, dry dressing and using his heel healing sandal. 11/01/15; this is a patient I saw last week for the first time. He had a small open wound on the plantar aspect of his left foot at roughly the level of the base of his fifth metatarsal. He had a considerable degree of thickened skin around this wound on the plantar aspect which I thought was from chronic pressure on this area. He tells [place] that he had drainage over the course of the week. No systemic symptoms. 11/08/15; culture last week grew Citrobacter korseri. This should've been sensitive to the Augmentin I gave him. He has seen Dr. Doran Durand who did his initial surgery and according the patient the plan is to give this another month and then the hardware might need to come out of this. This seems like a reasonable plan. I will adjust his antibiotics to ciprofloxacin which probably should continue for at least another 2 weeks. I gave him 10 days worth today 11/22/15 the patient has completed antibiotics. He has an appointment with Dr. Doran Durand this Friday. There is improved dimensions around the wound on the left fifth metatarsal base 11/29/15; the patient has completed antibiotics last week. Apparently his appointment with Dr. Doran Durand it is not until this Friday. Dimensions are roughly  the same. 12/06/15; saw Dr. Doran Durand. No x-ray told the end of the month, next appointment June 30. We have been using Aquacel Ag 12/13/15: No major change this week. Using Aquacel AG 621/17; arrived this week with maceration around the wound. There was quite a bit of undermining which required surgical debridement. I changed him to [facility] last week, by the patient's admission  he was up on this more this week 12/27/15; macerated tissue around the wound is removed with a scalpel and pickups. There is no undermining. Nonviable subcutaneous tissue and skin taken from the superior circumference of the wound is slough from the surface. READMISSION 03/06/16 since I last saw this patient at the end of June, he went for surgery on 01/11/16 by Dr. Doran Durand of orthopedics. He had a left foot irrigation and debridement, removal of hardware and placement of wound VAC. He is also been followed by Dr. Megan Salon of infectious disease and completed a six-week course of IV Rocephin for group B strep and Enterobacter in the VERLYN, LAMBERT. ([id number]) bone at the time of surgery. Apparently at the time of surgery the bone looked healthy so I don't think any bone was actually removed. He has been using silver alginate based dressings on the same wound area at the base of the left fifth metatarsal on the left. I note that he is also had arterial studies on 01/08/16, these showed a left ABI of 1.2 to and a right ABI of 1.3. Waveforms were listed as biphasic. He was not felt to have any specific arterial issues. 03/13/16; no real change in the condition of the wound at the left lateral foot at roughly the base of his fifth metatarsal. Use silver alginate last week. 03/18/16 arrives today with no open area. Being suspicious of the overlying callus I. Some of this back although I see nothing but covering tissue here/epithelium. There is no surrounding tenderness READMISSION 04/16/16 this is a patient I discharged about a  month ago. Initially a surgical wound on the lateral aspect of his left foot which subsequently became infected. The story he is giving today that he went back into his own modified shoe started to notice pain 2 weeks ago he was seen in Dr. Nona Dell office by a physician assistant last Wednesday and according the patient was told that everything looked fine however this is clearly now broken open and he has an open wound in the same spot that we have been dealing with repetitively. Situation is complicated by the fact that he is running short of money on long-term disability. He has not taken his insulin and at least 2 weeks was previously on NovoLog short acting insulin on a sliding scale and TRESIBA 35 units at bedtime. He is no longer able to afford any of his medication he was in the x-ray on 10/15. A plain x-ray showed healed fracture of the left fifth metatarsal bone status post removal of the associated plate and screw fixation hardware. There was no acute appearing osseous abnormality. His blood work showed a white count of 9.2 with an essentially normal differential comprehensive metabolic panel was normal. Previous CT scan of the foot on 01/08/16 showed no osteomyelitis previous vascular workup showed no evidence of significant PAD on 01/13/16 04/23/16; culture I did last week grew enterococcus [ampicillin sensitive] and MRSA. He saw infectious disease yesterday. They stop the clindamycin and ordered an MRI. This is not unreasonable. All the hardware is out of the foot at this point. 04/30/16 at this point in time we are still awaiting the results of the MRI at this point in time. Patient did have an area which appears to be somewhat macerated in the proximal portion of the wound where there is overlying necrotic skin that is doing nothing more than trapping fluid underneath. He continues to state that he does have some discomfort and again the concern  is for the possibility of osteomyelitis  hence the reason for the MRI order. We have been using a silver alginate dressing but again I think the reason this with his macerated as it was is that the dressing obscene could not reach the entirety of the wound due to some necrotic skin covering the proximal portion. No bleeding noted at this point in time on initial evaluation. 05/07/16 we receive the results of patient's MRI which shows that he has no evidence of osteomyelitis. This obviously is excellent news. Patient was definitely happy to hear this. He tells me the wound appears to be doing very well at this point in time and is pleased with the progress currently. 05/15/16; as noted the patient did not have osteomyelitis. He has been released by infectious disease and orthopedics. His wound is still open he had a debridement last week but complain when he got home he "bleeding for half a day". He is not had any pain. We have been using silver alginate with Kerlix gauze wrap. 05/28/16; patient arrives today with the wound in much the same condition as last time. He has a small opening on the lateral aspect of his foot however with debridement there is clear undermining medially there is no real evidence of infection here and I didn't see any point in culturing this. One would have to wonder if this isn't a simple matter of in adequate offloading as he is been using a healing sandal. 06/05/16; the open area is now on the plantar aspect of his foot and not decide. The wound almost appears to have "migrated". This was the term use by our intake nurse. 06/12/16; open area on the plantar aspect of his foot. Base of the wound looks very healthy. This will be his second week in a total contact cast 06/19/16; patient arrived today in a total contact cast. There was some expectation from our staff and myself that this area would be healed. Unfortunately the area was boggy and with rec pressure a fairly substantial amount of purulent drainage was  obtained. Specimen obtained for culture. The patient had no complaints of systemic problems including fever or chills or instability of his diabetes. There was no pain in the foot. Nevertheless a extensive debridement was required. 06/26/16; patient's culture from the abscess last week grew a combination of MRSA and ampicillin sensitive enterococcus. I had him on Augmentin and Septra however I have elected to give him a full 10 day course of Zyvox instead as I Recently treated this combination of organisms with Augmentin and Septra before. Arrives today with no systemic symptoms 07/03/16; the patient has 2 more treatments of Zyvox and then he is finished antibiotics the wound has improved now mostly on the lateral aspect of his foot. There is still some tenderness when he walks. 07/10/16; patient arrives today with Zyvox completed. He only has a small open area remaining. 07/24/16; she arrives here with no overt open area. The covering is thick callus/eschar. Nevertheless there is no open area here. He has some tenderness underneath the area but no overt infection is observed no drainage. The patient has a deformity in the foot with this area weekly to be exposed to more pressure in his foot where nevertheless that something we are going to have to deal with going forward. The patient has diabetic workboots and diabetic shoes. He has had a long BENN, TARVER. ([id number]) difficult course with the area here. This started as a fracture at work. He  had bone grafting from his calcaneus and screws. This got infected he had to have more surgery on the area. Bone at the time of that surgery I think showed enterococcus and group B strep. He had 6 weeks of Rocephin. Since then the area has waxed and waned in its difficulty. Recently he had an MRI in December that did not show osteomyelitis nevertheless he had an abscess that grew MRSA and enterococcus which I elected to treat with Zyvox. This was in December  and this wound is actually "healed" over 08/21/16; the patient came in today for his one-month follow-up visit. The area on the lateral aspect of his left foot looks much the same as some month ago. There is no evidence of an open wound here. However the patient tells me about a week after he went back to work he developed severe pain and swelling in the plantar aspect of his right foot first and fifth metatarsal heads. 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 He went to see his podiatrist at [facility]. She told him that he could not work on his feet. She told him to go back and his cam walking boot on the left. Not have open wounds obviously on the right. The patient is actually gone ahead and retired from his job because he does not feel he can work on his feet 09/18/16; patient comes back in saying he recently had some pain on the lateral aspect of his left foot. Asked that we look at this. Other than that all of his wounds have a viable surface. He has diabetic shoes. He is retired from work. READMISSION Dilan Novosad is a 58 year old man who we cared for for a prolonged period of time in late 2017 into March 2018. At that point he had suffered a fracture of his left fifth metatarsal at about the level of the base of the fifth metatarsal. He had surgical repair by Dr. Meryl Dare and he developed a very refractory wound over the fifth metatarsal. Ultimately this became infected he required hardware removal this eventually closed over and we discharged from the clinic in March of this year. He tells me as well until roughly 3 weeks ago when he developed increasing pain in the same area making it difficult for him to sleep. He was admitted to hospital from 9/5 through 9/7 with now an ulcer in the same area on the lateral aspect of his left foot at the level of the base of the metatarsal. X-rays and MRIs during this  hospitalization were negative. Wound culture showed a combination of Escherichia coli, Morganella and Pseudomonas. He was given IV Cipro and vancomycin the hospital and then discharged home on Cipro and Doxy. He returned to the ER on 9/16 with leg swelling and discomfort. I don't think anything was added at that point although he did have an ultrasound of the left leg that was negative for DVT. He was back in the ER on 10/4 again with pain in the area he was given Bactrim but states he had a significant allergic reaction to that which has abated once he stopped the Bactrim. I don't think he had a full course. He went to see his podiatrist yesterday who recommended some form of foot soak. He has a Darco forefoot offloading boot The patient is a type II diabetic on insulin. Tells me his recent hemoglobin A1c was 7. Arterial  studies done in July 2017 showed an ABI in the right of 1.3 on the left 1.2 to biphasic waveforms bilaterally. He was not felt to have arterial disease. As noted the patient has had 2 MRIs most recently on 10/4 this showed no evidence of an abscess or osteomyelitis and no change since the prior study of 03/05/17 there was nonspecific edema on the dorsum of the foot. ABIs in this clinic today 1.2 which would be unchanged from his study done in July/17 04/15/17; now down to 1 small wound on the left lateral foot. We wrapped him and applied collagen last week and the foot is fairly macerated. 04/22/17; small wound on the left lateral foot however it has some undermining. Using collagen 04/29/17; small wound on the left lateral foot some surrounding callus. Chronic damage in this area as a result of initial fracture nonhealing and secondary infection. 05/06/17; the patient arrives today with the area on the left lateral foot closed. There is thick subcutaneous tissue with a layer of callus. I took some of the callus off just to ensure adequate closure of the underlying tissue and there  is no open wound here. He has considerable deformity of this area of his foot however and unless there is an ability to offload this I think opening is going to be recurrent. He tells me he has diabetic foot wear although I have not actually seen this Readmission: 09/02/17 on evaluation today patient appears to be doing a little better compared to what he tells me what's going on in regard to his left lateral foot. He was previously seen in the ER this past Saturday it is now Tuesday. On Saturday he actually was treated with antibiotics including Cipro 500 mg two times a day and doxycycline 100 mg two times a day. Subsequently he also did have an x-ray performed of his left foot which revealed increased degenerative changes of the base of the fifth metatarsal since comparison in 2018. There was no radiographic evidence of osteomyelitis however. Patient has previously had an MRI of the left foot which was performed on 04/03/17 in this revealed at that point a soft tissue ulceration but no evidence of osteomyelitis. Patient has previously undergone testing in regard to his ABI's by Dr. Bridgett Larsson and this showed that he did have ABI was within normal limits which does correspond with ABI's we checked here in the office today. That study was on 01/13/16. With all that being said on evaluation today there does not appear to be any evidence of a true open wound of the left lateral fifth metatarsal region. He does have tenderness noted as well is a little bit of boggy/fluctuance feeling to the NEYTHAN, KOZLOV. ([id number]) area in this region of his foot and there is definitely a very solid and firm callous noted laterally. With that being said I am not able to find any obvious opening at this point there does appear to be a spot where there appears to been a opening very recently however patient states he has not noted any drainage from this currently. He does however have discomfort with palpation of this area  this is in the 3-4/10 range only with very firm palpation this is not too significant at all. Subsequently he does have a small ulcer at the medial nail bed of the right second toe where he inadvertently pulled off a portion of the toenail softly causing this injury. Otherwise there does not appear to be any evidence of infection  in regard to this right second toe. 09/23/17 on evaluation today patient actually appears to be doing well he has no open ulcerations noted at this point. With that being said he did have significant callous noted over the left lateral foot which did require callous pairing today he tolerated this without complication. Readmission: 11/07/17 patient presents today for readmission concerning to ulcers that he has. One on the right plantar fifth metatarsal region and the other on the left plantar fifth metatarsal region. He has previously had an x-ray which was performed on 11/05/17 and revealed that the patient had wounds noted of the base of the fifth metatarsal region without evidence of destruction to the bone to suggest osteomyelitis. Obviously this is excellent news. With that being said he does have a significantly large ulcer that extensively plantar surface of the wound and is concerning for the fact that he continues to walk on this due to the patient telling me that he "has to work" I do believe this has likely led to both the opening of the wound as well as the fact that is not really able to heal very well at this point. He has been seen by his podiatrist and apparently according to the note dated 10/23/17 the patient was provided with Santyl and recommended a dry dressing. No wound culture has been obtained at this point. As stated above he has had returned to work due to financial reasons and is not able to wear his postop shoes due to the fact that he cannot wear this and work. The wound is stated to have been present for "several weeks" prior to the visit with the  podiatrist on 10/23/17. With that being said again at least the good news is there does not appear to be any evidence of osteomyelitis. 11/14/17 on evaluation today patient appears to be doing poorly in regard to his bilateral foot ulcers. Unfortunately he seems to have significant callous with a lot of maceration underlined especially on the right even more so than the left. He has been tolerating the dressing changes without complication. With that being said overall I do think he's gonna have to have a significant debridement today. Wound History Patient presents with 2 open wounds that have been present for approximately 2 weeks. Patient has been treating wounds in the following manner: unknown. Laboratory tests have not been performed in the last month. Patient reportedly has tested positive for an antibiotic resistant organism. Patient reportedly has tested positive for osteomyelitis. Patient reportedly has not had testing performed to evaluate circulation in the legs. Patient History Information obtained from Patient. Family History Diabetes - Mother,Father, Heart Disease - Father,Mother, Hypertension - Mother,Father, Stroke - Father, No family history of Cancer, Hereditary Spherocytosis, Kidney Disease, Lung Disease, Seizures, Thyroid Problems, Tuberculosis. Social History Never smoker, Marital Status - Married, Alcohol Use - Never, Drug Use - No History, Caffeine Use - Daily. Medical History Eyes Denies history of Cataracts, Glaucoma, Optic Neuritis Ear/Nose/Mouth/Throat Denies history of Chronic sinus problems/congestion, Middle ear problems Hematologic/Lymphatic Denies history of Anemia, Hemophilia, Human Immunodeficiency Virus, Lymphedema, Sickle Cell Disease Respiratory Denies history of Aspiration, Asthma, Chronic Obstructive Pulmonary Disease (COPD), Pneumothorax, Sleep Apnea, Tuberculosis Cardiovascular CRUZITO, STANDRE ([id number]) Patient has history of  Hypertension Denies history of Angina, Arrhythmia, Congestive Heart Failure, Coronary Artery Disease, Deep Vein Thrombosis, Hypotension, Myocardial Infarction, Peripheral Arterial Disease, Peripheral Venous Disease, Phlebitis, Vasculitis Gastrointestinal Denies history of Cirrhosis , Colitis, Crohn s, Hepatitis A, Hepatitis B, Hepatitis C Endocrine Patient has  history of Type II Diabetes Denies history of Type I Diabetes Genitourinary Denies history of End Stage Renal Disease Immunological Denies history of Lupus Erythematosus, Raynaud s, Scleroderma Integumentary (Skin) Denies history of History of Burn, History of pressure wounds Musculoskeletal Denies history of Gout, Rheumatoid Arthritis, Osteoarthritis, Osteomyelitis Neurologic Patient has history of Neuropathy Denies history of Dementia, Quadriplegia, Paraplegia, Seizure Disorder Oncologic Denies history of Received Chemotherapy, Received Radiation Psychiatric Denies history of Anorexia/bulimia, Confinement Anxiety Patient is treated with Insulin. Blood sugar is tested. Review of Systems (ROS) Constitutional Symptoms (General Health) Denies complaints or symptoms of Fever, Chills. Respiratory The patient has no complaints or symptoms. Cardiovascular The patient has no complaints or symptoms. Psychiatric The patient has no complaints or symptoms. Objective Constitutional Obese and well-hydrated in no acute distress. Vitals Time Taken: 2:49 PM, Height: 70 in, Weight: 265 lbs, BMI: 38, Temperature: 98.2 F, Pulse: 98 bpm, Respiratory Rate: 18 breaths/min, Blood Pressure: 145/68 mmHg. Respiratory normal breathing without difficulty. Psychiatric this patient is able to make decisions and demonstrates good insight into disease process. Alert and Oriented x 3. pleasant and cooperative. ANTHONIE, LOTITO ([id number]) General Notes: Patient's right lateral foot wound did require significant debridement to remove callous which  was overlying an area of ulceration there was a lot of new skin growth but there was also an ulcer which extended in a very jagged pattern underneath the callous and this had been causing significant maceration fluid buildup obviously the wound was not gonna heal in this manner. The callous on the left also was removed to clean away from the wound bed so will not continue to become macerated. Patient tolerated this today without complication and post debridement things appear to be doing significantly better which is good news. No fevers, chills, nausea, or vomiting noted at this time. Integumentary (Hair, Skin) Wound #12 status is Open. Original cause of wound was Gradually Appeared. The wound is located on the Left,Lateral Foot. The wound measures 1.3cm length x 1cm width x 0.2cm depth; 1.021cm^2 area and 0.204cm^3 volume. There is Fat Layer (Subcutaneous Tissue) Exposed exposed. There is undermining starting at 9:00 and ending at 12:00 with a maximum distance of 0.7cm. There is a large amount of serosanguineous drainage noted. The wound margin is distinct with the outline attached to the wound base. There is large (67-100%) red granulation within the wound bed. There is a small (1-33%) amount of necrotic tissue within the wound bed including Eschar and Adherent Slough. The periwound skin appearance exhibited: Callus. The periwound skin appearance did not exhibit: Crepitus, Excoriation, Induration, Rash, Scarring, Dry/Scaly, Maceration, Atrophie Blanche, Cyanosis, Ecchymosis, Hemosiderin Staining, Mottled, Pallor, Rubor, Erythema. Periwound temperature was noted as No Abnormality. The periwound has tenderness on palpation. Wound #13 status is Open. Original cause of wound was Gradually Appeared. The wound is located on the Right,Plantar Metatarsal head first. The wound measures 0.3cm length x 0.5cm width x 0.3cm depth; 0.118cm^2 area and 0.035cm^3 volume. There is Fat Layer (Subcutaneous Tissue)  Exposed exposed. There is undermining starting at 7:00 and ending at 12:00 with a maximum distance of 0.3cm. There is a large amount of serous drainage noted. The wound margin is distinct with the outline attached to the wound base. There is medium (34-66%) red, pink granulation within the wound bed. There is a medium (34-66%) amount of necrotic tissue within the wound bed including Eschar and Adherent Slough. The periwound skin appearance exhibited: Callus. The periwound skin appearance did not exhibit: Crepitus, Excoriation, Induration, Rash, Scarring, Dry/Scaly,  Maceration, Atrophie Blanche, Cyanosis, Ecchymosis, Hemosiderin Staining, Mottled, Pallor, Rubor, Erythema. Periwound temperature was noted as No Abnormality. The periwound has tenderness on palpation. Assessment Active Problems ICD-10 E11.621 - Type 2 diabetes mellitus with foot ulcer L97.522 - Non-pressure chronic ulcer of other part of left foot with fat layer exposed L97.512 - Non-pressure chronic ulcer of other part of right foot with fat layer exposed Procedures Wound #12 Pre-procedure diagnosis of Wound #12 is a Diabetic Wound/Ulcer of the Lower Extremity located on the Left,Lateral Foot .Severity of Tissue Pre Debridement is: Fat layer exposed. There was a Selective/Open Wound Non-Viable Tissue Debridement with a total area of 1.3 sq cm performed by STONE III, Gerri Acre E., PA-C. With the following instrument(s): Curette, Forceps, and Scissors to remove Non-Viable tissue/material. Material removed includes Callus after achieving pain control using Lidocaine 4% Topical Solution. No specimens were taken. A time out was conducted at 15:39, prior to the start of the procedure. A Minimum amount of bleeding was controlled with Pressure. The procedure was tolerated well with a pain level of 0 throughout and a pain level of 0 following the procedure. Patient s Level of Consciousness post procedure was recorded as Awake and Alert and  post-procedure vitals were taken including Temperature: 98.2 F, Pulse: 98 bpm, Levey, Masud F. ([id number]) Respiratory Rate: 18 breaths/min, Blood Pressure: (145)/(68) mmHg. Post Debridement Measurements: 1.3cm length x 1cm width x 0.2cm depth; 0.204cm^3 volume. Character of Wound/Ulcer Post Debridement is improved. Severity of Tissue Post Debridement is: Fat layer exposed. Post procedure Diagnosis Wound #12: Same as Pre-Procedure Wound #13 Pre-procedure diagnosis of Wound #13 is a Diabetic Wound/Ulcer of the Lower Extremity located on the Right,Plantar Metatarsal head first .Severity of Tissue Pre Debridement is: Fat layer exposed. There was a Excisional Skin/Subcutaneous Tissue Debridement with a total area of 0.15 sq cm performed by STONE III, Hennessy Bartel E., PA-C. With the following instrument (s): Curette, Forceps, and Scissors to remove Viable and Non-Viable tissue/material. Material removed includes Callus, Subcutaneous Tissue, and Slough after achieving pain control using Lidocaine 4% Topical Solution. No specimens were taken. A time out was conducted at 15:34, prior to the start of the procedure. A Minimum amount of bleeding was controlled with Pressure. The procedure was tolerated well with a pain level of 0 throughout and a pain level of 0 following the procedure. Patient s Level of Consciousness post procedure was recorded as Awake and Alert and post-procedure vitals were taken including Temperature: 98.2 F, Pulse: 98 bpm, Respiratory Rate: 18 breaths/min, Blood Pressure: (145)/(68) mmHg. Post Debridement Measurements: 3.5cm length x 1.7cm width x 0.2cm depth; 0.935cm^3 volume. Character of Wound/Ulcer Post Debridement is improved. Severity of Tissue Post Debridement is: Fat layer exposed. Post procedure Diagnosis Wound #13: Same as Pre-Procedure Plan Wound Cleansing: Wound #12 Left,Lateral Foot: May Shower, gently pat wound dry prior to applying new dressing. Wound #13 Right,Plantar  Metatarsal head first: May Shower, gently pat wound dry prior to applying new dressing. Anesthetic (add to Medication List): Wound #12 Left,Lateral Foot: Topical Lidocaine 4% cream applied to wound bed prior to debridement (In Clinic Only). Wound #13 Right,Plantar Metatarsal head first: Topical Lidocaine 4% cream applied to wound bed prior to debridement (In Clinic Only). Primary Wound Dressing: Wound #12 Left,Lateral Foot: Silver Alginate Wound #13 Right,Plantar Metatarsal head first: Iodoflex Secondary Dressing: Wound #12 Left,Lateral Foot: Gauze, ABD and Kerlix/Conform - offload with foam or felt Wound #13 Right,Plantar Metatarsal head first: Gauze, ABD and Kerlix/Conform - offload with foam or felt Dressing Change  Frequency: Wound #12 Left,Lateral Foot: Change dressing every day. Wound #13 Right,Plantar Metatarsal head first: Change dressing every day. Follow-up Appointments: Wound #12 Left,Lateral Foot: Return Appointment in 1 week. Wound #13 Right,Plantar Metatarsal head first: Return Appointment in 1 week. Additional Orders / Instructions: Wound #12 Left,Lateral Foot: PETRO, TALENT. ([id number]) Increase protein intake. Wound #13 Right,Plantar Metatarsal head first: Increase protein intake. The following medication(s) was prescribed: Augmentin oral 875 mg-125 mg tablet 1 1 tablet oral taken 2 times a day for 10 days starting 11/14/2017 At this time I'm going to suggest that we initiate the above wound care orders for the next week to see how things do. He's in agreement this plan. We will subsequently continue with the regular follow-up visits that I would like to see him in one weeks time. Please see above for specific wound care orders. We will see patient for re-evaluation in 1 week(s) here in the clinic. If anything worsens or changes patient will contact our office for additional recommendations. Electronic Signature(s) Signed: 11/15/2017 1:18:07 PM By: Worthy Keeler PA-C Entered By: Worthy Keeler on 11/15/2017 13:06:15 [name] ([id number]) -------------------------------------------------------------------------------- ROS/PFSH Details Patient Name: [name]. Date of Service: 11/14/2017 2:15 PM Medical Record Number: [id number] Patient Account Number: [id number] Date of Birth/Sex: December 27, 1960 (57 y.o. M) Treating RN: Montey Hora Primary Care Provider: Lorelee Market Other Clinician: Referring Provider: Lorelee Market Treating Provider/Extender: Melburn Hake, Qais Jowers Weeks in Treatment: 1 Label Progress Note Print Version as History and Physical for this encounter Information Obtained From Patient Wound History Do you currently have one or more open woundso Yes How many open wounds do you currently haveo 2 Approximately how long have you had your woundso 2 weeks How have you been treating your wound(s) until nowo unknown Has your wound(s) ever healed and then re-openedo No Have you had any lab work done in the past montho No Have you tested positive for osteomyelitis (bone infection)o Yes Have you had any tests for circulation on your legso No Constitutional Symptoms (General Health) Complaints and Symptoms: Negative for: Fever; Chills Eyes Medical History: Negative for: Cataracts; Glaucoma; Optic Neuritis Ear/Nose/Mouth/Throat Medical History: Negative for: Chronic sinus problems/congestion; Middle ear problems Hematologic/Lymphatic Medical History: Negative for: Anemia; Hemophilia; Human Immunodeficiency Virus; Lymphedema; Sickle Cell Disease Respiratory Complaints and Symptoms: No Complaints or Symptoms Medical History: Negative for: Aspiration; Asthma; Chronic Obstructive Pulmonary Disease (COPD); Pneumothorax; Sleep Apnea; Tuberculosis Cardiovascular Complaints and Symptoms: No Complaints or Symptoms Medical History: Positive for: Hypertension Negative for: Angina; Arrhythmia; Congestive Heart Failure;  Coronary Artery Disease; Deep Vein Thrombosis; Hypotension; AUGUSTE, TEBBETTS ([id number]) Myocardial Infarction; Peripheral Arterial Disease; Peripheral Venous Disease; Phlebitis; Vasculitis Gastrointestinal Medical History: Negative for: Cirrhosis ; Colitis; Crohnos; Hepatitis A; Hepatitis B; Hepatitis C Endocrine Medical History: Positive for: Type II Diabetes Negative for: Type I Diabetes Time with diabetes: 20 yrs Treated with: Insulin Blood sugar tested every day: Yes Tested : 3 times weekly Genitourinary Medical History: Negative for: End Stage Renal Disease Immunological Medical History: Negative for: Lupus Erythematosus; Raynaudos; Scleroderma Integumentary (Skin) Medical History: Negative for: History of Burn; History of pressure wounds Musculoskeletal Medical History: Negative for: Gout; Rheumatoid Arthritis; Osteoarthritis; Osteomyelitis Neurologic Medical History: Positive for: Neuropathy Negative for: Dementia; Quadriplegia; Paraplegia; Seizure Disorder Oncologic Medical History: Negative for: Received Chemotherapy; Received Radiation Psychiatric Complaints and Symptoms: No Complaints or Symptoms Medical History: Negative for: Anorexia/bulimia; Confinement Anxiety Immunizations Pneumococcal Vaccine: Received Pneumococcal Vaccination: No ETHANAEL, VEITH ([id number]) Immunization Notes: up to date Implantable  Devices Family and Social History Cancer: No; Diabetes: Yes - Mother,Father; Heart Disease: Yes - Father,Mother; Hereditary Spherocytosis: No; Hypertension: Yes - Mother,Father; Kidney Disease: No; Lung Disease: No; Seizures: No; Stroke: Yes - Father; Thyroid Problems: No; Tuberculosis: No; Never smoker; Marital Status - Married; Alcohol Use: Never; Drug Use: No History; Caffeine Use: Daily; Financial Concerns: No; Food, Clothing or Shelter Needs: No; Support System Lacking: No; Transportation Concerns: No; Advanced Directives: No; Patient does not  want information on Advanced Directives; Do not resuscitate: No; Living Will: No; Medical Power of Attorney: No Physician Affirmation I have reviewed and agree with the above information. Electronic Signature(s) Signed: 11/15/2017 1:18:07 PM By: Worthy Keeler PA-C Signed: 11/17/2017 4:46:49 PM By: Montey Hora Entered By: Worthy Keeler on 11/15/2017 13:04:36 [name] ([id number]) -------------------------------------------------------------------------------- SuperBill Details Patient Name: AKEEM, HEPPLER. Date of Service: 11/14/2017 Medical Record Number: [id number] Patient Account Number: [id number] Date of Birth/Sex: February 08, 1961 (57 y.o. M) Treating RN: Montey Hora Primary Care Provider: Lorelee Market Other Clinician: Referring Provider: Lorelee Market Treating Provider/Extender: Melburn Hake, Safi Culotta Weeks in Treatment: 1 Diagnosis Coding ICD-10 Codes Code Description E11.621 Type 2 diabetes mellitus with foot ulcer L97.522 Non-pressure chronic ulcer of other part of left foot with fat layer exposed L97.512 Non-pressure chronic ulcer of other part of right foot with fat layer exposed Facility Procedures CPT4 Code: [id number] Description: [facility] [place] - DEB SUBQ TISSUE 20 SQ CM/< ICD-10 Diagnosis Description L97.512 Non-pressure chronic ulcer of other part of right foot with fat Modifier: layer exposed Quantity: 1 CPT4 Code: [id number] Description: 50539 - DEBRIDE WOUND 1ST 20 SQ CM OR < ICD-10 Diagnosis Description L97.522 Non-pressure chronic ulcer of other part of left foot with fat Modifier: layer exposed Quantity: 1 Physician Procedures CPT4 Code: [id number] Description: 11042 - WC PHYS SUBQ TISS 20 SQ CM ICD-10 Diagnosis Description L97.512 Non-pressure chronic ulcer of other part of right foot with fat Modifier: layer exposed Quantity: 1 CPT4 Code: [id number] Description: [place] - WC PHYS DEBR WO ANESTH 20 SQ CM ICD-10 Diagnosis Description L97.522 Non-pressure chronic  ulcer of other part of left foot with fat Modifier: layer exposed Quantity: 1 Electronic Signature(s) Signed: 11/15/2017 1:18:07 PM By: Worthy Keeler PA-C Entered By: Worthy Keeler on 11/15/2017 13:06:41

## 2017-11-21 ENCOUNTER — Encounter: Payer: BLUE CROSS/BLUE SHIELD | Admitting: Physician Assistant

## 2017-11-21 DIAGNOSIS — E11621 Type 2 diabetes mellitus with foot ulcer: Secondary | ICD-10-CM | POA: Diagnosis not present

## 2017-11-23 NOTE — Progress Notes (Signed)
[name], [name] ([id number]) Visit Report for 11/21/2017 Chief Complaint Document Details Patient Name: [name], [name]. Date of Service: 11/21/2017 8:45 AM Medical Record Number: [id number] Patient Account Number: [id number] Date of Birth/Sex: 1960-10-31 (57 y.o. M) Treating RN: Montey Hora Primary Care Provider: Lorelee Market Other Clinician: Referring Provider: Lorelee Market Treating Provider/Extender: Melburn Hake, Siah Steely Weeks in Treatment: 2 Information Obtained from: Patient Chief Complaint Bilateral foot ulcers Electronic Signature(s) Signed: 11/21/2017 9:42:33 PM By: Worthy Keeler PA-C Entered By: Worthy Keeler on 11/21/2017 09:27:04 [name] ([id number]) -------------------------------------------------------------------------------- Debridement Details Patient Name: [name]. Date of Service: 11/21/2017 8:45 AM Medical Record Number: [id number] Patient Account Number: [id number] Date of Birth/Sex: [birth year] (57 y.o. M) Treating RN: Montey Hora Primary Care Provider: Lorelee Market Other Clinician: Referring Provider: Lorelee Market Treating Provider/Extender: Melburn Hake, Robertson Colclough Weeks in Treatment: 2 Debridement Performed for Wound #13 Right,Plantar Metatarsal head first Assessment: Performed By: Physician STONE III, Adalynne Steffensmeier E., PA-C Debridement Type: Debridement Severity of Tissue Pre Fat layer exposed Debridement: Pre-procedure Verification/Time Yes - 09:33 Out Taken: Start Time: 09:33 Pain Control: Lidocaine 4% Topical Solution Total Area Debrided (L x W): 1.4 (cm) x 3.6 (cm) = 5.04 (cm) Tissue and other material Viable, Non-Viable, Slough, Subcutaneous, Fibrin/Exudate, Slough debrided: Level: Skin/Subcutaneous Tissue Debridement Description: Excisional Instrument: Curette, Forceps, Scissors Bleeding: Minimum Hemostasis Achieved: Pressure End Time: 09:37 Procedural Pain: 0 Post Procedural Pain: 0 Response to Treatment:  Procedure was tolerated well Level of Consciousness: Awake and Alert Post Procedure Vitals: Temperature: 98.2 Pulse: 93 Respiratory Rate: 18 Blood Pressure: Systolic Blood Pressure: 629 Diastolic Blood Pressure: 68 Post Debridement Measurements of Total Wound Length: (cm) 1.5 Width: (cm) 3.7 Depth: (cm) 0.4 Volume: (cm) 1.744 Character of Wound/Ulcer Post Debridement: Improved Severity of Tissue Post Debridement: Fat layer exposed Post Procedure Diagnosis Same as Pre-procedure Electronic Signature(s) Signed: 11/21/2017 5:24:01 PM By: Montey Hora Signed: 11/21/2017 9:42:33 PM By: Vic Blackbird, [name] ([id number]) Entered By: Montey Hora on 11/21/2017 09:39:02 [name] ([id number]) -------------------------------------------------------------------------------- Debridement Details Patient Name: [name]. Date of Service: 11/21/2017 8:45 AM Medical Record Number: [id number] Patient Account Number: [id number] Date of Birth/Sex: 04/30/1961 (57 y.o. M) Treating RN: Montey Hora Primary Care Provider: Lorelee Market Other Clinician: Referring Provider: Lorelee Market Treating Provider/Extender: Melburn Hake, Seher Schlagel Weeks in Treatment: 2 Debridement Performed for Wound #12 Left,Lateral Foot Assessment: Performed By: Physician STONE III, Natane Heward E., PA-C Debridement Type: Debridement Severity of Tissue Pre Fat layer exposed Debridement: Pre-procedure Verification/Time Yes - 09:38 Out Taken: Start Time: 09:38 Pain Control: Lidocaine 4% Topical Solution Total Area Debrided (L x W): 0.9 (cm) x 1.5 (cm) = 1.35 (cm) Tissue and other material Viable, Non-Viable, Slough, Subcutaneous, Fibrin/Exudate, Slough debrided: Level: Skin/Subcutaneous Tissue Debridement Description: Excisional Instrument: Curette Bleeding: Minimum Hemostasis Achieved: Pressure End Time: 09:40 Procedural Pain: 0 Post Procedural Pain: 0 Response to Treatment:  Procedure was tolerated well Level of Consciousness: Awake and Alert Post Procedure Vitals: Temperature: 98.2 Pulse: 93 Respiratory Rate: 18 Blood Pressure: Systolic Blood Pressure: 595 Diastolic Blood Pressure: 68 Post Debridement Measurements of Total Wound Length: (cm) 0.9 Width: (cm) 1.5 Depth: (cm) 0.4 Volume: (cm) 0.424 Character of Wound/Ulcer Post Debridement: Improved Severity of Tissue Post Debridement: Fat layer exposed Post Procedure Diagnosis Same as Pre-procedure Electronic Signature(s) Signed: 11/21/2017 5:24:01 PM By: Montey Hora Signed: 11/21/2017 9:42:33 PM By: Vic Blackbird, [name] ([id number]) Entered By: Montey Hora on 11/21/2017 09:40:27 [name] ([id number]) -------------------------------------------------------------------------------- HPI Details Patient  Name: [name], [name]. Date of Service: 11/21/2017 8:45 AM Medical Record Number: [id number] Patient Account Number: [id number] Date of Birth/Sex: 02/28/1961 (57 y.o. M) Treating RN: Montey Hora Primary Care Provider: Lorelee Market Other Clinician: Referring Provider: Lorelee Market Treating Provider/Extender: Melburn Hake, Cherl Gorney Weeks in Treatment: 2 History of Present Illness HPI Description: Pleasant 57 year old with history of diabetes (Hgb A1c 10.8 in 2014) and peripheral neuropathy. No PVD. L ABI 1.1. Status post right great toe partial amputation years ago. He was at work and on 10/22/2014, was injured by a cart, and suffered an ulceration to his left anterior calf. He says that it subsequently became infected, and he was treated with a course of antibiotics. He was found on initial exam to have an ulceration on the dorsum of his left third toe. He was unaware of this and attributes it to pressure from his steel toed boots. More recently he injured his right anterior calf on a cart. Ambulating normally per his baseline. He has been undergoing regular  debridements, applying mupirocin cream, and an Ace wrap for edema control. He returns to clinic for follow-up and is without complaints. No pain. No fever or chills. No drainage. 10/25/15; this is a 57 year old man who has type II diabetes with diabetic polyneuropathy. He tells me that he fractured his left fifth metatarsal in June 2016 when he presented with swelling. He does not recall a specific injury. His hemoglobin A1c was apparently too high at the time for consideration of surgery and he was put in some form of offloading. Ultimately he went to surgery in December with an allograft from his calcaneus to this site, plate and screws. He had an x-ray of the foot in March that showed concerns about nonunion. He tells me that in March he had to move and basically moved himself. He was on his foot a lot and then noticed some drainage from an open area. He has been following with his orthopedic surgeon Dr. Doran Durand. He has been applying a felt donut, dry dressing and using his heel healing sandal. 11/01/15; this is a patient I saw last week for the first time. He had a small open wound on the plantar aspect of his left foot at roughly the level of the base of his fifth metatarsal. He had a considerable degree of thickened skin around this wound on the plantar aspect which I thought was from chronic pressure on this area. He tells [place] that he had drainage over the course of the week. No systemic symptoms. 11/08/15; culture last week grew Citrobacter korseri. This should've been sensitive to the Augmentin I gave him. He has seen Dr. Doran Durand who did his initial surgery and according the patient the plan is to give this another month and then the hardware might need to come out of this. This seems like a reasonable plan. I will adjust his antibiotics to ciprofloxacin which probably should continue for at least another 2 weeks. I gave him 10 days worth today 11/22/15 the patient has completed antibiotics. He has  an appointment with Dr. Doran Durand this Friday. There is improved dimensions around the wound on the left fifth metatarsal base 11/29/15; the patient has completed antibiotics last week. Apparently his appointment with Dr. Doran Durand it is not until this Friday. Dimensions are roughly the same. 12/06/15; saw Dr. Doran Durand. No x-ray told the end of the month, next appointment June 30. We have been using Aquacel Ag 12/13/15: No major change this week. Using Aquacel AG 621/17; arrived  this week with maceration around the wound. There was quite a bit of undermining which required surgical debridement. I changed him to [facility] last week, by the patient's admission he was up on this more this week 12/27/15; macerated tissue around the wound is removed with a scalpel and pickups. There is no undermining. Nonviable subcutaneous tissue and skin taken from the superior circumference of the wound is slough from the surface. READMISSION 03/06/16 since I last saw this patient at the end of June, he went for surgery on 01/11/16 by Dr. Doran Durand of orthopedics. He had a left foot irrigation and debridement, removal of hardware and placement of wound VAC. He is also been followed by Dr. Megan Salon of infectious disease and completed a six-week course of IV Rocephin for group B strep and Enterobacter in the bone at the time of surgery. Apparently at the time of surgery the bone looked healthy so I don't think any bone was actually removed. He has been using silver alginate based dressings on the same wound area at the base of the left fifth metatarsal on the left. I note that he is also had arterial studies on 01/08/16, these showed a left ABI of 1.2 to and a right ABI of 1.3. Waveforms were listed as biphasic. He was not felt to have any specific arterial issues. 03/13/16; no real change in the condition of the wound at the left lateral foot at roughly the base of his fifth metatarsal. Use silver alginate last week. [name], [name] ([id number]) 03/18/16 arrives today with no open area. Being suspicious of the overlying callus I. Some of this back although I see nothing but covering tissue here/epithelium. There is no surrounding tenderness READMISSION 04/16/16 this is a patient I discharged about a month ago. Initially a surgical wound on the lateral aspect of his left foot which subsequently became infected. The story he is giving today that he went back into his own modified shoe started to notice pain 2 weeks ago he was seen in Dr. Nona Dell office by a physician assistant last Wednesday and according the patient was told that everything looked fine however this is clearly now broken open and he has an open wound in the same spot that we have been dealing with repetitively. Situation is complicated by the fact that he is running short of money on long-term disability. He has not taken his insulin and at least 2 weeks was previously on NovoLog short acting insulin on a sliding scale and TRESIBA 35 units at bedtime. He is no longer able to afford any of his medication he was in the x-ray on 10/15. A plain x-ray showed healed fracture of the left fifth metatarsal bone status post removal of the associated plate and screw fixation hardware. There was no acute appearing osseous abnormality. His blood work showed a white count of 9.2 with an essentially normal differential comprehensive metabolic panel was normal. Previous CT scan of the foot on 01/08/16 showed no osteomyelitis previous vascular workup showed no evidence of significant PAD on 01/13/16 04/23/16; culture I did last week grew enterococcus [ampicillin sensitive] and MRSA. He saw infectious disease yesterday. They stop the clindamycin and ordered an MRI. This is not unreasonable. All the hardware is out of the foot at this point. 04/30/16 at this point in time we are still awaiting the results of the MRI at this point in time. Patient did have an area which appears to  be somewhat macerated in the proximal portion of  the wound where there is overlying necrotic skin that is doing nothing more than trapping fluid underneath. He continues to state that he does have some discomfort and again the concern is for the possibility of osteomyelitis hence the reason for the MRI order. We have been using a silver alginate dressing but again I think the reason this with his macerated as it was is that the dressing obscene could not reach the entirety of the wound due to some necrotic skin covering the proximal portion. No bleeding noted at this point in time on initial evaluation. 05/07/16 we receive the results of patient's MRI which shows that he has no evidence of osteomyelitis. This obviously is excellent news. Patient was definitely happy to hear this. He tells me the wound appears to be doing very well at this point in time and is pleased with the progress currently. 05/15/16; as noted the patient did not have osteomyelitis. He has been released by infectious disease and orthopedics. His wound is still open he had a debridement last week but complain when he got home he "bleeding for half a day". He is not had any pain. We have been using silver alginate with Kerlix gauze wrap. 05/28/16; patient arrives today with the wound in much the same condition as last time. He has a small opening on the lateral aspect of his foot however with debridement there is clear undermining medially there is no real evidence of infection here and I didn't see any point in culturing this. One would have to wonder if this isn't a simple matter of in adequate offloading as he is been using a healing sandal. 06/05/16; the open area is now on the plantar aspect of his foot and not decide. The wound almost appears to have "migrated". This was the term use by our intake nurse. 06/12/16; open area on the plantar aspect of his foot. Base of the wound looks very healthy. This will be his second week in  a total contact cast 06/19/16; patient arrived today in a total contact cast. There was some expectation from our staff and myself that this area would be healed. Unfortunately the area was boggy and with rec pressure a fairly substantial amount of purulent drainage was obtained. Specimen obtained for culture. The patient had no complaints of systemic problems including fever or chills or instability of his diabetes. There was no pain in the foot. Nevertheless a extensive debridement was required. 06/26/16; patient's culture from the abscess last week grew a combination of MRSA and ampicillin sensitive enterococcus. I had him on Augmentin and Septra however I have elected to give him a full 10 day course of Zyvox instead as I Recently treated this combination of organisms with Augmentin and Septra before. Arrives today with no systemic symptoms 07/03/16; the patient has 2 more treatments of Zyvox and then he is finished antibiotics the wound has improved now mostly on the lateral aspect of his foot. There is still some tenderness when he walks. 07/10/16; patient arrives today with Zyvox completed. He only has a small open area remaining. 07/24/16; she arrives here with no overt open area. The covering is thick callus/eschar. Nevertheless there is no open area here. He has some tenderness underneath the area but no overt infection is observed no drainage. The patient has a deformity in the foot with this area weekly to be exposed to more pressure in his foot where nevertheless that something we are going to have to deal with going forward. The  patient has diabetic workboots and diabetic shoes. He has had a long difficult course with the area here. This started as a fracture at work. He had bone grafting from his calcaneus and screws. This got infected he had to have more surgery on the area. Bone at the time of that surgery I think showed enterococcus and group B strep. He had 6 weeks of Rocephin. Since  then the area has waxed and waned in its difficulty. Recently he had an MRI in December that did not show osteomyelitis nevertheless he had an abscess that grew MRSA and enterococcus which I elected to treat with Zyvox. This was in December and this wound is actually "healed" over [name], [name] ([id number]) 08/21/16; the patient came in today for his one-month follow-up visit. The area on the lateral aspect of his left foot looks much the same as some month ago. There is no evidence of an open wound here. However the patient tells me about a week after he went back to work he developed severe pain and swelling in the plantar aspect of his right foot first and fifth metatarsal heads. 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 He went to see his podiatrist at [facility]. She told him that he could not work on his feet. She told him to go back and his cam walking boot on the left. Not have open wounds obviously on the right. The patient is actually gone ahead and retired from his job because he does not feel he can work on his feet 09/18/16; patient comes back in saying he recently had some pain on the lateral aspect of his left foot. Asked that we look at this. Other than that all of his wounds have a viable surface. He has diabetic shoes. He is retired from work. READMISSION Demetreus Lothamer is a 57 year old man who we cared for for a prolonged period of time in late 2017 into March 2018. At that point he had suffered a fracture of his left fifth metatarsal at about the level of the base of the fifth metatarsal. He had surgical repair by Dr. Meryl Dare and he developed a very refractory wound over the fifth metatarsal. Ultimately this became infected he required hardware removal this eventually closed over and we discharged from the clinic in March of this year. He tells me as well until roughly 3 weeks ago when he  developed increasing pain in the same area making it difficult for him to sleep. He was admitted to hospital from 9/5 through 9/7 with now an ulcer in the same area on the lateral aspect of his left foot at the level of the base of the metatarsal. X-rays and MRIs during this hospitalization were negative. Wound culture showed a combination of Escherichia coli, Morganella and Pseudomonas. He was given IV Cipro and vancomycin the hospital and then discharged home on Cipro and Doxy. He returned to the ER on 9/16 with leg swelling and discomfort. I don't think anything was added at that point although he did have an ultrasound of the left leg that was negative for DVT. He was back in the ER on 10/4 again with pain in the area he was given Bactrim but states he had a significant allergic reaction to that which has abated once he stopped the Bactrim. I don't think he had a full course. He went to see his podiatrist yesterday who recommended  some form of foot soak. He has a Darco forefoot offloading boot The patient is a type II diabetic on insulin. Tells me his recent hemoglobin A1c was 7. Arterial studies done in July 2017 showed an ABI in the right of 1.3 on the left 1.2 to biphasic waveforms bilaterally. He was not felt to have arterial disease. As noted the patient has had 2 MRIs most recently on 10/4 this showed no evidence of an abscess or osteomyelitis and no change since the prior study of 03/05/17 there was nonspecific edema on the dorsum of the foot. ABIs in this clinic today 1.2 which would be unchanged from his study done in July/17 04/15/17; now down to 1 small wound on the left lateral foot. We wrapped him and applied collagen last week and the foot is fairly macerated. 04/22/17; small wound on the left lateral foot however it has some undermining. Using collagen 04/29/17; small wound on the left lateral foot some surrounding callus. Chronic damage in this area as a result of initial fracture  nonhealing and secondary infection. 05/06/17; the patient arrives today with the area on the left lateral foot closed. There is thick subcutaneous tissue with a layer of callus. I took some of the callus off just to ensure adequate closure of the underlying tissue and there is no open wound here. He has considerable deformity of this area of his foot however and unless there is an ability to offload this I think opening is going to be recurrent. He tells me he has diabetic foot wear although I have not actually seen this Readmission: 09/02/17 on evaluation today patient appears to be doing a little better compared to what he tells me what's going on in regard to his left lateral foot. He was previously seen in the ER this past Saturday it is now Tuesday. On Saturday he actually was treated with antibiotics including Cipro 500 mg two times a day and doxycycline 100 mg two times a day. Subsequently he also did have an x-ray performed of his left foot which revealed increased degenerative changes of the base of the fifth metatarsal since comparison in 2018. There was no radiographic evidence of osteomyelitis however. Patient has previously had an MRI of the left foot which was performed on 04/03/17 in this revealed at that point a soft tissue ulceration but no evidence of osteomyelitis. Patient has previously undergone testing in regard to his ABI's by Dr. Bridgett Larsson and this showed that he did have ABI was within normal limits which does correspond with ABI's we checked here in the office today. That study was on 01/13/16. With all that being said on evaluation today there does not appear to be any evidence of a true open wound of the left lateral fifth metatarsal region. He does have tenderness noted as well is a little bit of boggy/fluctuance feeling to the area in this region of his foot and there is definitely a very solid and firm callous noted laterally. With that being said I am not able to find any  obvious opening at this point there does appear to be a spot where there appears to been a opening very recently however patient states he has not noted any drainage from this currently. He does however have discomfort with palpation of this area this is in the 3-4/10 range only with very firm palpation this is not too significant at all. Subsequently he does have a small ulcer at the medial nail bed of the right second  toe where he inadvertently pulled off a portion of the toenail softly causing this injury. Otherwise there does not appear to be any evidence of infection in regard to this right second toe. [name], [name] ([id number]) 09/23/17 on evaluation today patient actually appears to be doing well he has no open ulcerations noted at this point. With that being said he did have significant callous noted over the left lateral foot which did require callous pairing today he tolerated this without complication. Readmission: 11/07/17 patient presents today for readmission concerning to ulcers that he has. One on the right plantar fifth metatarsal region and the other on the left plantar fifth metatarsal region. He has previously had an x-ray which was performed on 11/05/17 and revealed that the patient had wounds noted of the base of the fifth metatarsal region without evidence of destruction to the bone to suggest osteomyelitis. Obviously this is excellent news. With that being said he does have a significantly large ulcer that extensively plantar surface of the wound and is concerning for the fact that he continues to walk on this due to the patient telling me that he "has to work" I do believe this has likely led to both the opening of the wound as well as the fact that is not really able to heal very well at this point. He has been seen by his podiatrist and apparently according to the note dated 10/23/17 the patient was provided with Santyl and recommended a dry dressing. No wound culture has  been obtained at this point. As stated above he has had returned to work due to financial reasons and is not able to wear his postop shoes due to the fact that he cannot wear this and work. The wound is stated to have been present for "several weeks" prior to the visit with the podiatrist on 10/23/17. With that being said again at least the good news is there does not appear to be any evidence of osteomyelitis. 11/14/17 on evaluation today patient appears to be doing poorly in regard to his bilateral foot ulcers. Unfortunately he seems to have significant callous with a lot of maceration underlined especially on the right even more so than the left. He has been tolerating the dressing changes without complication. With that being said overall I do think he's gonna have to have a significant debridement today. 11/21/17 on evaluation today patient presents for follow-up concerning his bilateral lateral foot ulcers. He has been tolerating the dressing changes without complication. His wife helps perform these for him. With that being said the patient states that the left lateral foot ulcer actually is continuing to give him trouble as far as discomfort is concerned. With that being said actually appears better. Electronic Signature(s) Signed: 11/21/2017 9:42:33 PM By: Worthy Keeler PA-C Entered By: Worthy Keeler on 11/21/2017 10:08:49 [name] ([id number]) -------------------------------------------------------------------------------- Physical Exam Details Patient Name: WEBB, WEED. Date of Service: 11/21/2017 8:45 AM Medical Record Number: [id number] Patient Account Number: [id number] Date of Birth/Sex: April 28, 1961 (57 y.o. M) Treating RN: Montey Hora Primary Care Provider: Lorelee Market Other Clinician: Referring Provider: Lorelee Market Treating Provider/Extender: Melburn Hake, Nakhia Levitan Weeks in Treatment: 2 Constitutional Well-nourished and well-hydrated in no acute  distress. Respiratory normal breathing without difficulty. Psychiatric this patient is able to make decisions and demonstrates good insight into disease process. Alert and Oriented x 3. pleasant and cooperative. Notes There is no evidence of infection at this point patient's wounds both did require sharp debridement today  to clean away surface slough and subcutaneous tissue. Fortunately there does not appear to be any evidence of infection. Post debridement both wounds appeared to be doing better. Electronic Signature(s) Signed: 11/21/2017 9:42:33 PM By: Worthy Keeler PA-C Entered By: Worthy Keeler on 11/21/2017 10:09:25 [name] ([id number]) -------------------------------------------------------------------------------- Physician Orders Details Patient Name: KINTA, MARTIS. Date of Service: 11/21/2017 8:45 AM Medical Record Number: [id number] Patient Account Number: [id number] Date of Birth/Sex: July 03, 1960 (57 y.o. M) Treating RN: Montey Hora Primary Care Provider: Lorelee Market Other Clinician: Referring Provider: Lorelee Market Treating Provider/Extender: Melburn Hake, Brice Potteiger Weeks in Treatment: 2 Verbal / Phone Orders: No Diagnosis Coding ICD-10 Coding Code Description E11.621 Type 2 diabetes mellitus with foot ulcer L97.522 Non-pressure chronic ulcer of other part of left foot with fat layer exposed L97.512 Non-pressure chronic ulcer of other part of right foot with fat layer exposed Wound Cleansing Wound #12 Left,Lateral Foot o May Shower, gently pat wound dry prior to applying new dressing. Wound #13 Right,Plantar Metatarsal head first o May Shower, gently pat wound dry prior to applying new dressing. Anesthetic (add to Medication List) Wound #12 Left,Lateral Foot o Topical Lidocaine 4% cream applied to wound bed prior to debridement (In Clinic Only). Wound #13 Right,Plantar Metatarsal head first o Topical Lidocaine 4% cream applied to wound  bed prior to debridement (In Clinic Only). Primary Wound Dressing Wound #12 Left,Lateral Foot o Silver Alginate Wound #13 Right,Plantar Metatarsal head first o Silver Alginate Secondary Dressing Wound #12 Left,Lateral Foot o Gauze, ABD and Kerlix/Conform - offload with foam or felt Wound #13 Right,Plantar Metatarsal head first o Gauze, ABD and Kerlix/Conform - offload with foam or felt Dressing Change Frequency Wound #12 Left,Lateral Foot o Change dressing every day. Wound #13 Right,Plantar Metatarsal head first o Change dressing every day. Follow-up Appointments ELIU, BATCH ([id number]) Wound #12 Left,Lateral Foot o Return Appointment in 1 week. Wound #13 Right,Plantar Metatarsal head first o Return Appointment in 1 week. Additional Orders / Instructions Wound #12 Left,Lateral Foot o Increase protein intake. Wound #13 Right,Plantar Metatarsal head first o Increase protein intake. Electronic Signature(s) Signed: 11/21/2017 5:24:01 PM By: Montey Hora Signed: 11/21/2017 9:42:33 PM By: Worthy Keeler PA-C Entered By: Montey Hora on 11/21/2017 09:41:06 [name] ([id number]) -------------------------------------------------------------------------------- Problem List Details Patient Name: CALYX, HAWKER. Date of Service: 11/21/2017 8:45 AM Medical Record Number: [id number] Patient Account Number: [id number] Date of Birth/Sex: 11/23/1960 (57 y.o. M) Treating RN: Montey Hora Primary Care Provider: Lorelee Market Other Clinician: Referring Provider: Lorelee Market Treating Provider/Extender: Melburn Hake, Terricka Onofrio Weeks in Treatment: 2 Active Problems ICD-10 Impacting Encounter Code Description Active Date Wound Healing Diagnosis E11.621 Type 2 diabetes mellitus with foot ulcer 11/07/2017 Yes L97.522 Non-pressure chronic ulcer of other part of left foot with fat 11/07/2017 Yes layer exposed L97.512 Non-pressure chronic ulcer of other  part of right foot with fat 11/07/2017 Yes layer exposed Inactive Problems Resolved Problems Electronic Signature(s) Signed: 11/21/2017 9:42:33 PM By: Worthy Keeler PA-C Entered By: Worthy Keeler on 11/21/2017 09:26:57 [name] ([id number]) -------------------------------------------------------------------------------- Progress Note/History and Physical Details Patient Name: [name]. Date of Service: 11/21/2017 8:45 AM Medical Record Number: [id number] Patient Account Number: [id number] Date of Birth/Sex: 01-Apr-1961 (57 y.o. M) Treating RN: Montey Hora Primary Care Provider: Lorelee Market Other Clinician: Referring Provider: Lorelee Market Treating Provider/Extender: Melburn Hake, Ninfa Giannelli Weeks in Treatment: 2 Subjective Chief Complaint Information obtained from Patient Bilateral foot ulcers History of Present Illness (HPI) Pleasant 57 year old with history  of diabetes (Hgb A1c 10.8 in 2014) and peripheral neuropathy. No PVD. L ABI 1.1. Status post right great toe partial amputation years ago. He was at work and on 10/22/2014, was injured by a cart, and suffered an ulceration to his left anterior calf. He says that it subsequently became infected, and he was treated with a course of antibiotics. He was found on initial exam to have an ulceration on the dorsum of his left third toe. He was unaware of this and attributes it to pressure from his steel toed boots. More recently he injured his right anterior calf on a cart. Ambulating normally per his baseline. He has been undergoing regular debridements, applying mupirocin cream, and an Ace wrap for edema control. He returns to clinic for follow-up and is without complaints. No pain. No fever or chills. No drainage. 10/25/15; this is a 57 year old man who has type II diabetes with diabetic polyneuropathy. He tells me that he fractured his left fifth metatarsal in June 2016 when he presented with swelling. He does  not recall a specific injury. His hemoglobin A1c was apparently too high at the time for consideration of surgery and he was put in some form of offloading. Ultimately he went to surgery in December with an allograft from his calcaneus to this site, plate and screws. He had an x-ray of the foot in March that showed concerns about nonunion. He tells me that in March he had to move and basically moved himself. He was on his foot a lot and then noticed some drainage from an open area. He has been following with his orthopedic surgeon Dr. Doran Durand. He has been applying a felt donut, dry dressing and using his heel healing sandal. 11/01/15; this is a patient I saw last week for the first time. He had a small open wound on the plantar aspect of his left foot at roughly the level of the base of his fifth metatarsal. He had a considerable degree of thickened skin around this wound on the plantar aspect which I thought was from chronic pressure on this area. He tells [place] that he had drainage over the course of the week. No systemic symptoms. 11/08/15; culture last week grew Citrobacter korseri. This should've been sensitive to the Augmentin I gave him. He has seen Dr. Doran Durand who did his initial surgery and according the patient the plan is to give this another month and then the hardware might need to come out of this. This seems like a reasonable plan. I will adjust his antibiotics to ciprofloxacin which probably should continue for at least another 2 weeks. I gave him 10 days worth today 11/22/15 the patient has completed antibiotics. He has an appointment with Dr. Doran Durand this Friday. There is improved dimensions around the wound on the left fifth metatarsal base 11/29/15; the patient has completed antibiotics last week. Apparently his appointment with Dr. Doran Durand it is not until this Friday. Dimensions are roughly the same. 12/06/15; saw Dr. Doran Durand. No x-ray told the end of the month, next appointment June 30. We  have been using Aquacel Ag 12/13/15: No major change this week. Using Aquacel AG 621/17; arrived this week with maceration around the wound. There was quite a bit of undermining which required surgical debridement. I changed him to [facility] last week, by the patient's admission he was up on this more this week 12/27/15; macerated tissue around the wound is removed with a scalpel and pickups. There is no undermining. Nonviable subcutaneous tissue and  skin taken from the superior circumference of the wound is slough from the surface. READMISSION 03/06/16 since I last saw this patient at the end of June, he went for surgery on 01/11/16 by Dr. Doran Durand of orthopedics. He had a left foot irrigation and debridement, removal of hardware and placement of wound VAC. He is also been followed by Dr. Megan Salon of infectious disease and completed a six-week course of IV Rocephin for group B strep and Enterobacter in the NEVILLE, [place]. ([id number]) bone at the time of surgery. Apparently at the time of surgery the bone looked healthy so I don't think any bone was actually removed. He has been using silver alginate based dressings on the same wound area at the base of the left fifth metatarsal on the left. I note that he is also had arterial studies on 01/08/16, these showed a left ABI of 1.2 to and a right ABI of 1.3. Waveforms were listed as biphasic. He was not felt to have any specific arterial issues. 03/13/16; no real change in the condition of the wound at the left lateral foot at roughly the base of his fifth metatarsal. Use silver alginate last week. 03/18/16 arrives today with no open area. Being suspicious of the overlying callus I. Some of this back although I see nothing but covering tissue here/epithelium. There is no surrounding tenderness READMISSION 04/16/16 this is a patient I discharged about a month ago. Initially a surgical wound on the lateral aspect of his left foot which subsequently became  infected. The story he is giving today that he went back into his own modified shoe started to notice pain 2 weeks ago he was seen in Dr. Nona Dell office by a physician assistant last Wednesday and according the patient was told that everything looked fine however this is clearly now broken open and he has an open wound in the same spot that we have been dealing with repetitively. Situation is complicated by the fact that he is running short of money on long-term disability. He has not taken his insulin and at least 2 weeks was previously on NovoLog short acting insulin on a sliding scale and TRESIBA 35 units at bedtime. He is no longer able to afford any of his medication he was in the x-ray on 10/15. A plain x-ray showed healed fracture of the left fifth metatarsal bone status post removal of the associated plate and screw fixation hardware. There was no acute appearing osseous abnormality. His blood work showed a white count of 9.2 with an essentially normal differential comprehensive metabolic panel was normal. Previous CT scan of the foot on 01/08/16 showed no osteomyelitis previous vascular workup showed no evidence of significant PAD on 01/13/16 04/23/16; culture I did last week grew enterococcus [ampicillin sensitive] and MRSA. He saw infectious disease yesterday. They stop the clindamycin and ordered an MRI. This is not unreasonable. All the hardware is out of the foot at this point. 04/30/16 at this point in time we are still awaiting the results of the MRI at this point in time. Patient did have an area which appears to be somewhat macerated in the proximal portion of the wound where there is overlying necrotic skin that is doing nothing more than trapping fluid underneath. He continues to state that he does have some discomfort and again the concern is for the possibility of osteomyelitis hence the reason for the MRI order. We have been using a silver alginate dressing but again I think the  reason this  with his macerated as it was is that the dressing obscene could not reach the entirety of the wound due to some necrotic skin covering the proximal portion. No bleeding noted at this point in time on initial evaluation. 05/07/16 we receive the results of patient's MRI which shows that he has no evidence of osteomyelitis. This obviously is excellent news. Patient was definitely happy to hear this. He tells me the wound appears to be doing very well at this point in time and is pleased with the progress currently. 05/15/16; as noted the patient did not have osteomyelitis. He has been released by infectious disease and orthopedics. His wound is still open he had a debridement last week but complain when he got home he "bleeding for half a day". He is not had any pain. We have been using silver alginate with Kerlix gauze wrap. 05/28/16; patient arrives today with the wound in much the same condition as last time. He has a small opening on the lateral aspect of his foot however with debridement there is clear undermining medially there is no real evidence of infection here and I didn't see any point in culturing this. One would have to wonder if this isn't a simple matter of in adequate offloading as he is been using a healing sandal. 06/05/16; the open area is now on the plantar aspect of his foot and not decide. The wound almost appears to have "migrated". This was the term use by our intake nurse. 06/12/16; open area on the plantar aspect of his foot. Base of the wound looks very healthy. This will be his second week in a total contact cast 06/19/16; patient arrived today in a total contact cast. There was some expectation from our staff and myself that this area would be healed. Unfortunately the area was boggy and with rec pressure a fairly substantial amount of purulent drainage was obtained. Specimen obtained for culture. The patient had no complaints of systemic problems including  fever or chills or instability of his diabetes. There was no pain in the foot. Nevertheless a extensive debridement was required. 06/26/16; patient's culture from the abscess last week grew a combination of MRSA and ampicillin sensitive enterococcus. I had him on Augmentin and Septra however I have elected to give him a full 10 day course of Zyvox instead as I Recently treated this combination of organisms with Augmentin and Septra before. Arrives today with no systemic symptoms 07/03/16; the patient has 2 more treatments of Zyvox and then he is finished antibiotics the wound has improved now mostly on the lateral aspect of his foot. There is still some tenderness when he walks. 07/10/16; patient arrives today with Zyvox completed. He only has a small open area remaining. 07/24/16; she arrives here with no overt open area. The covering is thick callus/eschar. Nevertheless there is no open area here. He has some tenderness underneath the area but no overt infection is observed no drainage. The patient has a deformity in the foot with this area weekly to be exposed to more pressure in his foot where nevertheless that something we are going to have to deal with going forward. The patient has diabetic workboots and diabetic shoes. He has had a long [name], [name]. ([id number]) difficult course with the area here. This started as a fracture at work. He had bone grafting from his calcaneus and screws. This got infected he had to have more surgery on the area. Bone at the time of that surgery I think  showed enterococcus and group B strep. He had 6 weeks of Rocephin. Since then the area has waxed and waned in its difficulty. Recently he had an MRI in December that did not show osteomyelitis nevertheless he had an abscess that grew MRSA and enterococcus which I elected to treat with Zyvox. This was in December and this wound is actually "healed" over 08/21/16; the patient came in today for his one-month  follow-up visit. The area on the lateral aspect of his left foot looks much the same as some month ago. There is no evidence of an open wound here. However the patient tells me about a week after he went back to work he developed severe pain and swelling in the plantar aspect of his right foot first and fifth metatarsal heads. 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 He went to see his podiatrist at [facility]. She told him that he could not work on his feet. She told him to go back and his cam walking boot on the left. Not have open wounds obviously on the right. The patient is actually gone ahead and retired from his job because he does not feel he can work on his feet 09/18/16; patient comes back in saying he recently had some pain on the lateral aspect of his left foot. Asked that we look at this. Other than that all of his wounds have a viable surface. He has diabetic shoes. He is retired from work. READMISSION Kasper Mudrick is a 57 year old man who we cared for for a prolonged period of time in late 2017 into March 2018. At that point he had suffered a fracture of his left fifth metatarsal at about the level of the base of the fifth metatarsal. He had surgical repair by Dr. Meryl Dare and he developed a very refractory wound over the fifth metatarsal. Ultimately this became infected he required hardware removal this eventually closed over and we discharged from the clinic in March of this year. He tells me as well until roughly 3 weeks ago when he developed increasing pain in the same area making it difficult for him to sleep. He was admitted to hospital from 9/5 through 9/7 with now an ulcer in the same area on the lateral aspect of his left foot at the level of the base of the metatarsal. X-rays and MRIs during this hospitalization were negative. Wound culture showed a combination of Escherichia coli,  Morganella and Pseudomonas. He was given IV Cipro and vancomycin the hospital and then discharged home on Cipro and Doxy. He returned to the ER on 9/16 with leg swelling and discomfort. I don't think anything was added at that point although he did have an ultrasound of the left leg that was negative for DVT. He was back in the ER on 10/4 again with pain in the area he was given Bactrim but states he had a significant allergic reaction to that which has abated once he stopped the Bactrim. I don't think he had a full course. He went to see his podiatrist yesterday who recommended some form of foot soak. He has a Darco forefoot offloading boot The patient is a type II diabetic on insulin. Tells me his recent hemoglobin A1c was 7. Arterial studies done in July 2017 showed an ABI in the right of 1.3 on the left 1.2 to biphasic waveforms bilaterally. He was not felt to have arterial disease.  As noted the patient has had 2 MRIs most recently on 10/4 this showed no evidence of an abscess or osteomyelitis and no change since the prior study of 03/05/17 there was nonspecific edema on the dorsum of the foot. ABIs in this clinic today 1.2 which would be unchanged from his study done in July/17 04/15/17; now down to 1 small wound on the left lateral foot. We wrapped him and applied collagen last week and the foot is fairly macerated. 04/22/17; small wound on the left lateral foot however it has some undermining. Using collagen 04/29/17; small wound on the left lateral foot some surrounding callus. Chronic damage in this area as a result of initial fracture nonhealing and secondary infection. 05/06/17; the patient arrives today with the area on the left lateral foot closed. There is thick subcutaneous tissue with a layer of callus. I took some of the callus off just to ensure adequate closure of the underlying tissue and there is no open wound here. He has considerable deformity of this area of his foot however  and unless there is an ability to offload this I think opening is going to be recurrent. He tells me he has diabetic foot wear although I have not actually seen this Readmission: 09/02/17 on evaluation today patient appears to be doing a little better compared to what he tells me what's going on in regard to his left lateral foot. He was previously seen in the ER this past Saturday it is now Tuesday. On Saturday he actually was treated with antibiotics including Cipro 500 mg two times a day and doxycycline 100 mg two times a day. Subsequently he also did have an x-ray performed of his left foot which revealed increased degenerative changes of the base of the fifth metatarsal since comparison in 2018. There was no radiographic evidence of osteomyelitis however. Patient has previously had an MRI of the left foot which was performed on 04/03/17 in this revealed at that point a soft tissue ulceration but no evidence of osteomyelitis. Patient has previously undergone testing in regard to his ABI's by Dr. Bridgett Larsson and this showed that he did have ABI was within normal limits which does correspond with ABI's we checked here in the office today. That study was on 01/13/16. With all that being said on evaluation today there does not appear to be any evidence of a true open wound of the left lateral fifth metatarsal region. He does have tenderness noted as well is a little bit of boggy/fluctuance feeling to the [name], [name]. ([id number]) area in this region of his foot and there is definitely a very solid and firm callous noted laterally. With that being said I am not able to find any obvious opening at this point there does appear to be a spot where there appears to been a opening very recently however patient states he has not noted any drainage from this currently. He does however have discomfort with palpation of this area this is in the 3-4/10 range only with very firm palpation this is not too significant at  all. Subsequently he does have a small ulcer at the medial nail bed of the right second toe where he inadvertently pulled off a portion of the toenail softly causing this injury. Otherwise there does not appear to be any evidence of infection in regard to this right second toe. 09/23/17 on evaluation today patient actually appears to be doing well he has no open ulcerations noted at this point. With that  being said he did have significant callous noted over the left lateral foot which did require callous pairing today he tolerated this without complication. Readmission: 11/07/17 patient presents today for readmission concerning to ulcers that he has. One on the right plantar fifth metatarsal region and the other on the left plantar fifth metatarsal region. He has previously had an x-ray which was performed on 11/05/17 and revealed that the patient had wounds noted of the base of the fifth metatarsal region without evidence of destruction to the bone to suggest osteomyelitis. Obviously this is excellent news. With that being said he does have a significantly large ulcer that extensively plantar surface of the wound and is concerning for the fact that he continues to walk on this due to the patient telling me that he "has to work" I do believe this has likely led to both the opening of the wound as well as the fact that is not really able to heal very well at this point. He has been seen by his podiatrist and apparently according to the note dated 10/23/17 the patient was provided with Santyl and recommended a dry dressing. No wound culture has been obtained at this point. As stated above he has had returned to work due to financial reasons and is not able to wear his postop shoes due to the fact that he cannot wear this and work. The wound is stated to have been present for "several weeks" prior to the visit with the podiatrist on 10/23/17. With that being said again at least the good news is there does  not appear to be any evidence of osteomyelitis. 11/14/17 on evaluation today patient appears to be doing poorly in regard to his bilateral foot ulcers. Unfortunately he seems to have significant callous with a lot of maceration underlined especially on the right even more so than the left. He has been tolerating the dressing changes without complication. With that being said overall I do think he's gonna have to have a significant debridement today. 11/21/17 on evaluation today patient presents for follow-up concerning his bilateral lateral foot ulcers. He has been tolerating the dressing changes without complication. His wife helps perform these for him. With that being said the patient states that the left lateral foot ulcer actually is continuing to give him trouble as far as discomfort is concerned. With that being said actually appears better. Wound History Patient presents with 2 open wounds that have been present for approximately 2 weeks. Patient has been treating wounds in the following manner: unknown. Laboratory tests have not been performed in the last month. Patient reportedly has tested positive for an antibiotic resistant organism. Patient reportedly has tested positive for osteomyelitis. Patient reportedly has not had testing performed to evaluate circulation in the legs. Patient History Information obtained from Patient. Family History Diabetes - Mother,Father, Heart Disease - Father,Mother, Hypertension - Mother,Father, Stroke - Father, No family history of Cancer, Hereditary Spherocytosis, Kidney Disease, Lung Disease, Seizures, Thyroid Problems, Tuberculosis. Social History Never smoker, Marital Status - Married, Alcohol Use - Never, Drug Use - No History, Caffeine Use - Daily. Medical History Eyes Denies history of Cataracts, Glaucoma, Optic Neuritis Ear/Nose/Mouth/Throat Denies history of Chronic sinus problems/congestion, Middle ear  problems Hematologic/Lymphatic NIKE, SOUTHWELL ([id number]) Denies history of Anemia, Hemophilia, Human Immunodeficiency Virus, Lymphedema, Sickle Cell Disease Respiratory Denies history of Aspiration, Asthma, Chronic Obstructive Pulmonary Disease (COPD), Pneumothorax, Sleep Apnea, Tuberculosis Cardiovascular Patient has history of Hypertension Denies history of Angina, Arrhythmia, Congestive Heart Failure, Coronary  Artery Disease, Deep Vein Thrombosis, Hypotension, Myocardial Infarction, Peripheral Arterial Disease, Peripheral Venous Disease, Phlebitis, Vasculitis Gastrointestinal Denies history of Cirrhosis , Colitis, Crohn s, Hepatitis A, Hepatitis B, Hepatitis C Endocrine Patient has history of Type II Diabetes Denies history of Type I Diabetes Genitourinary Denies history of End Stage Renal Disease Immunological Denies history of Lupus Erythematosus, Raynaud s, Scleroderma Integumentary (Skin) Denies history of History of Burn, History of pressure wounds Musculoskeletal Denies history of Gout, Rheumatoid Arthritis, Osteoarthritis, Osteomyelitis Neurologic Patient has history of Neuropathy Denies history of Dementia, Quadriplegia, Paraplegia, Seizure Disorder Oncologic Denies history of Received Chemotherapy, Received Radiation Psychiatric Denies history of Anorexia/bulimia, Confinement Anxiety Patient is treated with Insulin. Blood sugar is tested. Review of Systems (ROS) Constitutional Symptoms (General Health) Denies complaints or symptoms of Fever, Chills. Respiratory The patient has no complaints or symptoms. Cardiovascular The patient has no complaints or symptoms. Psychiatric The patient has no complaints or symptoms. Objective Constitutional Well-nourished and well-hydrated in no acute distress. Vitals Time Taken: 9:01 AM, Height: 70 in, Weight: 265 lbs, BMI: 38, Temperature: 98.2 F, Pulse: 93 bpm, Respiratory Rate: 18 breaths/min, Blood Pressure: 136/68  mmHg. Respiratory THESEUS, [name]. ([id number]) normal breathing without difficulty. Psychiatric this patient is able to make decisions and demonstrates good insight into disease process. Alert and Oriented x 3. pleasant and cooperative. General Notes: There is no evidence of infection at this point patient's wounds both did require sharp debridement today to clean away surface slough and subcutaneous tissue. Fortunately there does not appear to be any evidence of infection. Post debridement both wounds appeared to be doing better. Integumentary (Hair, Skin) Wound #12 status is Open. Original cause of wound was Gradually Appeared. The wound is located on the Left,Lateral Foot. The wound measures 0.9cm length x 1.5cm width x 0.3cm depth; 1.06cm^2 area and 0.318cm^3 volume. There is Fat Layer (Subcutaneous Tissue) Exposed exposed. There is no tunneling noted, however, there is undermining starting at 8:00 and ending at 12:00 with a maximum distance of 0.4cm. There is a large amount of serosanguineous drainage noted. The wound margin is distinct with the outline attached to the wound base. There is large (67-100%) red granulation within the wound bed. There is a small (1-33%) amount of necrotic tissue within the wound bed including Eschar and Adherent Slough. The periwound skin appearance exhibited: Callus. The periwound skin appearance did not exhibit: Crepitus, Excoriation, Induration, Rash, Scarring, Dry/Scaly, Maceration, Atrophie Blanche, Cyanosis, Ecchymosis, Hemosiderin Staining, Mottled, Pallor, Rubor, Erythema. Periwound temperature was noted as No Abnormality. The periwound has tenderness on palpation. Wound #13 status is Open. Original cause of wound was Gradually Appeared. The wound is located on the Right,Plantar Metatarsal head first. The wound measures 1.4cm length x 3.6cm width x 0.4cm depth; 3.958cm^2 area and 1.583cm^3 volume. There is Fat Layer (Subcutaneous Tissue) Exposed  exposed. There is no tunneling or undermining noted. There is a large amount of serous drainage noted. The wound margin is distinct with the outline attached to the wound base. There is medium (34-66%) red, pink granulation within the wound bed. There is a medium (34-66%) amount of necrotic tissue within the wound bed including Eschar and Adherent Slough. The periwound skin appearance exhibited: Callus, Maceration. The periwound skin appearance did not exhibit: Crepitus, Excoriation, Induration, Rash, Scarring, Dry/Scaly, Atrophie Blanche, Cyanosis, Ecchymosis, Hemosiderin Staining, Mottled, Pallor, Rubor, Erythema. Periwound temperature was noted as No Abnormality. The periwound has tenderness on palpation. Assessment Active Problems ICD-10 E11.621 - Type 2 diabetes mellitus with foot ulcer L97.522 -  Non-pressure chronic ulcer of other part of left foot with fat layer exposed L97.512 - Non-pressure chronic ulcer of other part of right foot with fat layer exposed Procedures Wound #12 Pre-procedure diagnosis of Wound #12 is a Diabetic Wound/Ulcer of the Lower Extremity located on the Left,Lateral Foot .Severity of Tissue Pre Debridement is: Fat layer exposed. There was a Excisional Skin/Subcutaneous Tissue Debridement with a total area of 1.35 sq cm performed by STONE III, Peggy Monk E., PA-C. With the following instrument(s): Curette to remove Viable and Non-Viable tissue/material. Material removed includes Subcutaneous Tissue, Slough, and Fibrin/Exudate after achieving pain control using Lidocaine 4% Topical Solution. No specimens were taken. A time out was conducted at 09:38, prior to the start of the procedure. A Minimum amount of bleeding was controlled with Pressure. The ASHDEN, SONNENBERG ([id number]) procedure was tolerated well with a pain level of 0 throughout and a pain level of 0 following the procedure. Patient s Level of Consciousness post procedure was recorded as Awake and Alert and  post-procedure vitals were taken including Temperature: 98.2 F, Pulse: 93 bpm, Respiratory Rate: 18 breaths/min, Blood Pressure: (136)/(68) mmHg. Post Debridement Measurements: 0.9cm length x 1.5cm width x 0.4cm depth; 0.424cm^3 volume. Character of Wound/Ulcer Post Debridement is improved. Severity of Tissue Post Debridement is: Fat layer exposed. Post procedure Diagnosis Wound #12: Same as Pre-Procedure Wound #13 Pre-procedure diagnosis of Wound #13 is a Diabetic Wound/Ulcer of the Lower Extremity located on the Right,Plantar Metatarsal head first .Severity of Tissue Pre Debridement is: Fat layer exposed. There was a Excisional Skin/Subcutaneous Tissue Debridement with a total area of 5.04 sq cm performed by STONE III, Jaeliana Lococo E., PA-C. With the following instrument (s): Curette, Forceps, and Scissors to remove Viable and Non-Viable tissue/material. Material removed includes Subcutaneous Tissue, Slough, and Fibrin/Exudate after achieving pain control using Lidocaine 4% Topical Solution. No specimens were taken. A time out was conducted at 09:33, prior to the start of the procedure. A Minimum amount of bleeding was controlled with Pressure. The procedure was tolerated well with a pain level of 0 throughout and a pain level of 0 following the procedure. Patient s Level of Consciousness post procedure was recorded as Awake and Alert and post- procedure vitals were taken including Temperature: 98.2 F, Pulse: 93 bpm, Respiratory Rate: 18 breaths/min, Blood Pressure: (136)/(68) mmHg. Post Debridement Measurements: 1.5cm length x 3.7cm width x 0.4cm depth; 1.744cm^3 volume. Character of Wound/Ulcer Post Debridement is improved. Severity of Tissue Post Debridement is: Fat layer exposed. Post procedure Diagnosis Wound #13: Same as Pre-Procedure Plan Wound Cleansing: Wound #12 Left,Lateral Foot: May Shower, gently pat wound dry prior to applying new dressing. Wound #13 Right,Plantar Metatarsal head  first: May Shower, gently pat wound dry prior to applying new dressing. Anesthetic (add to Medication List): Wound #12 Left,Lateral Foot: Topical Lidocaine 4% cream applied to wound bed prior to debridement (In Clinic Only). Wound #13 Right,Plantar Metatarsal head first: Topical Lidocaine 4% cream applied to wound bed prior to debridement (In Clinic Only). Primary Wound Dressing: Wound #12 Left,Lateral Foot: Silver Alginate Wound #13 Right,Plantar Metatarsal head first: Silver Alginate Secondary Dressing: Wound #12 Left,Lateral Foot: Gauze, ABD and Kerlix/Conform - offload with foam or felt Wound #13 Right,Plantar Metatarsal head first: Gauze, ABD and Kerlix/Conform - offload with foam or felt Dressing Change Frequency: Wound #12 Left,Lateral Foot: Change dressing every day. Wound #13 Right,Plantar Metatarsal head first: Change dressing every day. Follow-up Appointments: Wound #12 Left,Lateral Foot: Return Appointment in 1 week. Wound #13 Right,Plantar Metatarsal head  first: HAYZEN, LORENSON ([id number]) Return Appointment in 1 week. Additional Orders / Instructions: Wound #12 Left,Lateral Foot: Increase protein intake. Wound #13 Right,Plantar Metatarsal head first: Increase protein intake. I'm gonna recommend currently that we go ahead and continue with the silver alginate for the left lateral foot we will also initiate the same treatment for the right lateral foot. We will see were things stand in one weeks time when we see him for reevaluation. Please see above for specific wound care orders. We will see patient for re-evaluation in 1 week(s) here in the clinic. If anything worsens or changes patient will contact our office for additional recommendations. Electronic Signature(s) Signed: 11/21/2017 9:42:33 PM By: Worthy Keeler PA-C Entered By: Worthy Keeler on 11/21/2017 10:09:57 [name]  ([id number]) -------------------------------------------------------------------------------- ROS/PFSH Details Patient Name: KACYN, SOUDER. Date of Service: 11/21/2017 8:45 AM Medical Record Number: [id number] Patient Account Number: [id number] Date of Birth/Sex: [birth year] (57 y.o. M) Treating RN: Montey Hora Primary Care Provider: Lorelee Market Other Clinician: Referring Provider: Lorelee Market Treating Provider/Extender: Melburn Hake, Graeden Bitner Weeks in Treatment: 2 Label Progress Note Print Version as History and Physical for this encounter Information Obtained From Patient Wound History Do you currently have one or more open woundso Yes How many open wounds do you currently haveo 2 Approximately how long have you had your woundso 2 weeks How have you been treating your wound(s) until nowo unknown Has your wound(s) ever healed and then re-openedo No Have you had any lab work done in the past montho No Have you tested positive for osteomyelitis (bone infection)o Yes Have you had any tests for circulation on your legso No Constitutional Symptoms (General Health) Complaints and Symptoms: Negative for: Fever; Chills Eyes Medical History: Negative for: Cataracts; Glaucoma; Optic Neuritis Ear/Nose/Mouth/Throat Medical History: Negative for: Chronic sinus problems/congestion; Middle ear problems Hematologic/Lymphatic Medical History: Negative for: Anemia; Hemophilia; Human Immunodeficiency Virus; Lymphedema; Sickle Cell Disease Respiratory Complaints and Symptoms: No Complaints or Symptoms Medical History: Negative for: Aspiration; Asthma; Chronic Obstructive Pulmonary Disease (COPD); Pneumothorax; Sleep Apnea; Tuberculosis Cardiovascular Complaints and Symptoms: No Complaints or Symptoms Medical History: Positive for: Hypertension Negative for: Angina; Arrhythmia; Congestive Heart Failure; Coronary Artery Disease; Deep Vein Thrombosis; Hypotension; ASHTYN, FREILICH  ([id number]) Myocardial Infarction; Peripheral Arterial Disease; Peripheral Venous Disease; Phlebitis; Vasculitis Gastrointestinal Medical History: Negative for: Cirrhosis ; Colitis; Crohnos; Hepatitis A; Hepatitis B; Hepatitis C Endocrine Medical History: Positive for: Type II Diabetes Negative for: Type I Diabetes Time with diabetes: 20 yrs Treated with: Insulin Blood sugar tested every day: Yes Tested : 3 times weekly Genitourinary Medical History: Negative for: End Stage Renal Disease Immunological Medical History: Negative for: Lupus Erythematosus; Raynaudos; Scleroderma Integumentary (Skin) Medical History: Negative for: History of Burn; History of pressure wounds Musculoskeletal Medical History: Negative for: Gout; Rheumatoid Arthritis; Osteoarthritis; Osteomyelitis Neurologic Medical History: Positive for: Neuropathy Negative for: Dementia; Quadriplegia; Paraplegia; Seizure Disorder Oncologic Medical History: Negative for: Received Chemotherapy; Received Radiation Psychiatric Complaints and Symptoms: No Complaints or Symptoms Medical History: Negative for: Anorexia/bulimia; Confinement Anxiety Immunizations Pneumococcal Vaccine: Received Pneumococcal Vaccination: No KOEN, ANTILLA ([id number]) Immunization Notes: up to date Implantable Devices Family and Social History Cancer: No; Diabetes: Yes - Mother,Father; Heart Disease: Yes - Father,Mother; Hereditary Spherocytosis: No; Hypertension: Yes - Mother,Father; Kidney Disease: No; Lung Disease: No; Seizures: No; Stroke: Yes - Father; Thyroid Problems: No; Tuberculosis: No; Never smoker; Marital Status - Married; Alcohol Use: Never; Drug Use: No History; Caffeine Use: Daily; Financial Concerns: No; Food, Clothing or  Shelter Needs: No; Support System Lacking: No; Transportation Concerns: No; Advanced Directives: No; Patient does not want information on Advanced Directives; Do not resuscitate: No; Living Will:  No; Medical Power of Attorney: No Physician Affirmation I have reviewed and agree with the above information. Electronic Signature(s) Signed: 11/21/2017 5:24:01 PM By: Montey Hora Signed: 11/21/2017 9:42:33 PM By: Worthy Keeler PA-C Entered By: Worthy Keeler on 11/21/2017 10:09:10 [name] ([id number]) -------------------------------------------------------------------------------- SuperBill Details Patient Name: AYSON, CHERUBINI. Date of Service: 11/21/2017 Medical Record Number: [id number] Patient Account Number: [id number] Date of Birth/Sex: 1961-05-23 (57 y.o. M) Treating RN: Montey Hora Primary Care Provider: Lorelee Market Other Clinician: Referring Provider: Lorelee Market Treating Provider/Extender: Melburn Hake, Calloway Andrus Weeks in Treatment: 2 Diagnosis Coding ICD-10 Codes Code Description E11.621 Type 2 diabetes mellitus with foot ulcer L97.522 Non-pressure chronic ulcer of other part of left foot with fat layer exposed L97.512 Non-pressure chronic ulcer of other part of right foot with fat layer exposed Facility Procedures CPT4 Code: [id number] Description: 12458 - DEB SUBQ TISSUE 20 SQ CM/< ICD-10 Diagnosis Description L97.522 Non-pressure chronic ulcer of other part of left foot with fat L97.512 Non-pressure chronic ulcer of other part of right foot with fat Modifier: layer exposed layer exposed Quantity: 1 Physician Procedures CPT4 Code: [id number] Description: 11042 - WC PHYS SUBQ TISS 20 SQ CM ICD-10 Diagnosis Description L97.522 Non-pressure chronic ulcer of other part of left foot with fat L97.512 Non-pressure chronic ulcer of other part of right foot with fat Modifier: layer exposed layer exposed Quantity: 1 Electronic Signature(s) Signed: 11/21/2017 9:42:33 PM By: Worthy Keeler PA-C Entered By: Worthy Keeler on 11/21/2017 10:10:13

## 2017-11-23 NOTE — Progress Notes (Signed)
DUKE, WEISENSEL ([id number]) Visit Report for 11/21/2017 Arrival Information Details Patient Name: [name], [name]. Date of Service: 11/21/2017 8:45 AM Medical Record Number: [id number] Patient Account Number: [id number] Date of Birth/Sex: [birth year] (57 y.o. M) Treating RN: Cornell Barman Primary Care Leala Bryand: Lorelee Market Other Clinician: Referring Alanah Sakuma: Lorelee Market Treating Gautam Langhorst/Extender: Melburn Hake, HOYT Weeks in Treatment: 2 Visit Information History Since Last Visit Added or deleted any medications: No Patient Arrived: Ambulatory Any new allergies or adverse reactions: No Arrival Time: 08:59 Had a fall or experienced change in No Accompanied By: self activities of daily living that may affect Transfer Assistance: None risk of falls: Patient Identification Verified: Yes Signs or symptoms of abuse/neglect since last visito No Secondary Verification Process Completed: Yes Hospitalized since last visit: No Patient Requires Transmission-Based No Implantable device outside of the clinic excluding No Precautions: cellular tissue based products placed in the center Patient Has Alerts: Yes since last visit: Patient Alerts: DM II Has Dressing in Place as Prescribed: Yes Pain Present Now: Yes Electronic Signature(s) Signed: 11/21/2017 6:21:51 PM By: Gretta Cool, BSN, RN, CWS, Kim RN, BSN Entered By: Gretta Cool, BSN, RN, CWS, Kim on 11/21/2017 09:00:58 [name] ([id number]) -------------------------------------------------------------------------------- Encounter Discharge Information Details Patient Name: [name], [name]. Date of Service: 11/21/2017 8:45 AM Medical Record Number: [id number] Patient Account Number: [id number] Date of Birth/Sex: 04/07/1961 (57 y.o. M) Treating RN: Cornell Barman Primary Care Ovida Delagarza: Lorelee Market Other Clinician: Referring Andrus Sharp: Lorelee Market Treating Caley Ciaramitaro/Extender: Melburn Hake, HOYT Weeks in Treatment: 2 Encounter  Discharge Information Items Discharge Condition: Stable Ambulatory Status: Ambulatory Discharge Destination: Home Transportation: Private Auto Accompanied By: self Schedule Follow-up Appointment: Yes Clinical Summary of Care: Electronic Signature(s) Signed: 11/21/2017 6:21:51 PM By: Gretta Cool, BSN, RN, CWS, Kim RN, BSN Entered By: Gretta Cool, BSN, RN, CWS, Kim on 11/21/2017 11:53:22 [name] ([id number]) -------------------------------------------------------------------------------- Lower Extremity Assessment Details Patient Name: [name], [name]. Date of Service: 11/21/2017 8:45 AM Medical Record Number: [id number] Patient Account Number: [id number] Date of Birth/Sex: 1961/05/08 (57 y.o. M) Treating RN: Cornell Barman Primary Care Javiel Canepa: Lorelee Market Other Clinician: Referring Eniyah Eastmond: Lorelee Market Treating Kymani Shimabukuro/Extender: Melburn Hake, HOYT Weeks in Treatment: 2 Edema Assessment Assessed: [Left: No] [Right: No] [Left: Edema] [Right: :] Calf Left: Right: Point of Measurement: 36 cm From Medial Instep 36 cm 36 cm Ankle Left: Right: Point of Measurement: 11 cm From Medial Instep 24.1 cm 24.5 cm Vascular Assessment Claudication: Claudication Assessment [Left:None] [Right:None] Pulses: Dorsalis Pedis Palpable: [Left:Yes] [Right:Yes] Posterior Tibial Extremity colors, hair growth, and conditions: Extremity Color: [Left:Hyperpigmented] [Right:Hyperpigmented] Hair Growth on Extremity: [Left:No] [Right:No] Temperature of Extremity: [Left:Warm] [Right:Warm] Capillary Refill: [Left:< 3 seconds] [Right:< 3 seconds] Toe Nail Assessment Left: Right: Thick: Yes Yes Discolored: Yes Yes Deformed: Yes Yes Improper Length and Hygiene: Yes Yes Electronic Signature(s) Signed: 11/21/2017 6:21:51 PM By: Gretta Cool, BSN, RN, CWS, Kim RN, BSN Entered By: Gretta Cool, BSN, RN, CWS, Kim on 11/21/2017 09:13:06 [name]  ([id number]) -------------------------------------------------------------------------------- Multi Wound Chart Details Patient Name: [name]. Date of Service: 11/21/2017 8:45 AM Medical Record Number: [id number] Patient Account Number: [id number] Date of Birth/Sex: 12-05-1960 (57 y.o. M) Treating RN: Montey Hora Primary Care Zareena Willis: Lorelee Market Other Clinician: Referring Becky Berberian: Lorelee Market Treating Rissie Sculley/Extender: Melburn Hake, HOYT Weeks in Treatment: 2 Vital Signs Height(in): 70 Pulse(bpm): 93 Weight(lbs): 265 Blood Pressure(mmHg): 136/68 Body Mass Index(BMI): 38 Temperature(F): 98.2 Respiratory Rate 18 (breaths/min): Photos: [12:No Photos] [13:No Photos] [N/A:N/A] Wound Location: [12:Left Foot - Lateral] [13:Right Metatarsal head first - Plantar] [N/A:N/A] Wounding  Event: [12:Gradually Appeared] [13:Gradually Appeared] [N/A:N/A] Primary Etiology: [12:Diabetic Wound/Ulcer of the Lower Extremity] [13:Diabetic Wound/Ulcer of the Lower Extremity] [N/A:N/A] Comorbid History: [12:Hypertension, Type II Diabetes, Neuropathy] [13:Hypertension, Type II Diabetes, Neuropathy] [N/A:N/A] Date Acquired: [12:10/24/2017] [13:10/24/2017] [N/A:N/A] Weeks of Treatment: [12:2] [13:2] [N/A:N/A] Wound Status: [12:Open] [13:Open] [N/A:N/A] Measurements L x W x D [12:0.9x1.5x0.3] [13:1.4x3.6x0.4] [N/A:N/A] (cm) Area (cm) : [12:1.06] [13:3.958] [N/A:N/A] Volume (cm) : [12:0.318] [13:1.583] [N/A:N/A] % Reduction in Area: [12:-12.50%] [13:-8321.30%] [N/A:N/A] % Reduction in Volume: [12:-69.10%] [13:-11207.10%] [N/A:N/A] Starting Position 1 [12:8] (o'clock): Ending Position 1 [12:12] (o'clock): Maximum Distance 1 (cm): [12:0.4] Undermining: [12:Yes] [13:No] [N/A:N/A] Classification: [12:Grade 1] [13:Grade 1] [N/A:N/A] Exudate Amount: [12:Large] [13:Large] [N/A:N/A] Exudate Type: [12:Serosanguineous] [13:Serous] [N/A:N/A] Exudate Color: [12:red, brown] [13:amber]  [N/A:N/A] Wound Margin: [12:Distinct, outline attached] [13:Distinct, outline attached] [N/A:N/A] Granulation Amount: [12:Large (67-100%)] [13:Medium (34-66%)] [N/A:N/A] Granulation Quality: [12:Red] [13:Red, Pink] [N/A:N/A] Necrotic Amount: [12:Small (1-33%)] [13:Medium (34-66%)] [N/A:N/A] Necrotic Tissue: [12:Eschar, Adherent Slough] [13:Eschar, Adherent Slough] [N/A:N/A] Exposed Structures: [12:Fat Layer (Subcutaneous Tissue) Exposed: Yes Fascia: No Tendon: No Muscle: No] [13:Fat Layer (Subcutaneous Tissue) Exposed: Yes Fascia: No Tendon: No Muscle: No] [N/A:N/A] Joint: No Joint: No Bone: No Bone: No Epithelialization: None None N/A Periwound Skin Texture: Callus: Yes Callus: Yes N/A Excoriation: No Excoriation: No Induration: No Induration: No Crepitus: No Crepitus: No Rash: No Rash: No Scarring: No Scarring: No Periwound Skin Moisture: Maceration: No Maceration: Yes N/A Dry/Scaly: No Dry/Scaly: No Periwound Skin Color: Atrophie Blanche: No Atrophie Blanche: No N/A Cyanosis: No Cyanosis: No Ecchymosis: No Ecchymosis: No Erythema: No Erythema: No Hemosiderin Staining: No Hemosiderin Staining: No Mottled: No Mottled: No Pallor: No Pallor: No Rubor: No Rubor: No Temperature: No Abnormality No Abnormality N/A Tenderness on Palpation: Yes Yes N/A Wound Preparation: Ulcer Cleansing: Ulcer Cleansing: N/A Rinsed/Irrigated with Saline Rinsed/Irrigated with Saline Topical Anesthetic Applied: Topical Anesthetic Applied: Other: lidocaine 4% Other: lidocaine 4% Treatment Notes Electronic Signature(s) Signed: 11/21/2017 5:24:01 PM By: Montey Hora Entered By: Montey Hora on 11/21/2017 09:33:16 [name] ([id number]) -------------------------------------------------------------------------------- [place] Details Patient Name: [name], [name]. Date of Service: 11/21/2017 8:45 AM Medical Record Number: [id number] Patient Account  Number: [id number] Date of Birth/Sex: 1961-04-28 (57 y.o. M) Treating RN: Montey Hora Primary Care Aydn Ferrara: Lorelee Market Other Clinician: Referring Janett Kamath: Lorelee Market Treating Lydie Stammen/Extender: Melburn Hake, HOYT Weeks in Treatment: 2 Active Inactive ` Abuse / Safety / Falls / Self Care Management Nursing Diagnoses: Impaired physical mobility Goals: Patient will remain injury free related to falls Date Initiated: 11/07/2017 Target Resolution Date: 02/06/2018 Goal Status: Active Interventions: Assess fall risk on admission and as needed Notes: ` Orientation to the Wound Care Program Nursing Diagnoses: Knowledge deficit related to the wound healing center program Goals: Patient/caregiver will verbalize understanding of the [facility] Date Initiated: 11/07/2017 Target Resolution Date: 02/07/2018 Goal Status: Active Interventions: Provide education on orientation to the wound center Notes: ` Wound/Skin Impairment Nursing Diagnoses: Impaired tissue integrity Goals: Ulcer/skin breakdown will heal within 14 weeks Date Initiated: 11/07/2017 Target Resolution Date: 02/06/2018 Goal Status: Active Interventions: QUINTAVIOUS, RINCK ([id number]) Assess patient/caregiver ability to obtain necessary supplies Assess patient/caregiver ability to perform ulcer/skin care regimen upon admission and as needed Assess ulceration(s) every visit Notes: Electronic Signature(s) Signed: 11/21/2017 5:24:01 PM By: Montey Hora Entered By: Montey Hora on 11/21/2017 09:32:12 [name] ([id number]) -------------------------------------------------------------------------------- Pain Assessment Details Patient Name: [name]. Date of Service: 11/21/2017 8:45 AM Medical Record Number: [id number] Patient Account Number: [id number] Date of Birth/Sex: 1960-12-31 (56 y.o.  M) Treating RN: Cornell Barman Primary Care Brockton Mckesson: Lorelee Market Other  Clinician: Referring Early Steel: Lorelee Market Treating Junette Bernat/Extender: Melburn Hake, HOYT Weeks in Treatment: 2 Active Problems Location of Pain Severity and Description of Pain Patient Has Paino Yes Site Locations Pain Location: Generalized Pain Rate the pain. Current Pain Level: 8 Pain Management and Medication Current Pain Management: Electronic Signature(s) Signed: 11/21/2017 6:21:51 PM By: Gretta Cool, BSN, RN, CWS, Kim RN, BSN Entered By: Gretta Cool, BSN, RN, CWS, Kim on 11/21/2017 09:01:23 [name] ([id number]) -------------------------------------------------------------------------------- Patient/Caregiver Education Details Patient Name: [name]. Date of Service: 11/21/2017 8:45 AM Medical Record Number: [id number] Patient Account Number: [id number] Date of Birth/Gender: [birth year] (57 y.o. M) Treating RN: Cornell Barman Primary Care Physician: Lorelee Market Other Clinician: Referring Physician: Lorelee Market Treating Physician/Extender: Sharalyn Ink in Treatment: 2 Education Assessment Education Provided To: Patient Education Topics Provided Wound/Skin Impairment: Handouts: Caring for Your Ulcer, Other: continue wound care as prescribed Methods: Demonstration, Explain/Verbal Responses: State content correctly Electronic Signature(s) Signed: 11/21/2017 6:21:51 PM By: Gretta Cool, BSN, RN, CWS, Kim RN, BSN Entered By: Gretta Cool, BSN, RN, CWS, Kim on 11/21/2017 11:53:52 [name] ([id number]) -------------------------------------------------------------------------------- Wound Assessment Details Patient Name: [name], [name]. Date of Service: 11/21/2017 8:45 AM Medical Record Number: [id number] Patient Account Number: [id number] Date of Birth/Sex: [birth year] (57 y.o. M) Treating RN: Cornell Barman Primary Care Taneil Lazarus: Lorelee Market Other Clinician: Referring Rolla Kedzierski: Lorelee Market Treating [name]/Extender: Melburn Hake, HOYT Weeks in  Treatment: 2 Wound Status Wound Number: 12 Primary Etiology: Diabetic Wound/Ulcer of the Lower Extremity Wound Location: Left Foot - Lateral Wound Status: Open Wounding Event: Gradually Appeared Comorbid Hypertension, Type II Diabetes, Date Acquired: 10/24/2017 History: Neuropathy Weeks Of Treatment: 2 Clustered Wound: No Photos Photo Uploaded By: Gretta Cool, BSN, RN, CWS, Kim on 11/21/2017 17:55:25 Wound Measurements Length: (cm) 0.9 % Reducti Width: (cm) 1.5 % Reducti Depth: (cm) 0.3 Epithelia Area: (cm) 1.06 Tunnelin Volume: (cm) 0.318 Undermin Starti Ending Maximu on in Area: -12.5% on in Volume: -69.1% lization: None g: No ing: Yes ng Position (o'clock): 8 Position (o'clock): 12 m Distance: (cm) 0.4 Wound Description Classification: Grade 1 Foul Odor Wound Margin: Distinct, outline attached Slough/Fi Exudate Amount: Large Exudate Type: Serosanguineous Exudate Color: red, brown After Cleansing: No brino Yes Wound Bed Granulation Amount: Large (67-100%) Exposed Structure Granulation Quality: Red Fascia Exposed: No Necrotic Amount: Small (1-33%) Fat Layer (Subcutaneous Tissue) Exposed: Yes Necrotic Quality: Eschar, Adherent Slough Tendon Exposed: No Muscle Exposed: No [name], [name]. ([id number]) Joint Exposed: No Bone Exposed: No Periwound Skin Texture Texture Color No Abnormalities Noted: No No Abnormalities Noted: No Callus: Yes Atrophie Blanche: No Crepitus: No Cyanosis: No Excoriation: No Ecchymosis: No Induration: No Erythema: No Rash: No Hemosiderin Staining: No Scarring: No Mottled: No Pallor: No Moisture Rubor: No No Abnormalities Noted: No Dry / Scaly: No Temperature / Pain Maceration: No Temperature: No Abnormality Tenderness on Palpation: Yes Wound Preparation Ulcer Cleansing: Rinsed/Irrigated with Saline Topical Anesthetic Applied: Other: lidocaine 4%, Treatment Notes Wound #12 (Left, Lateral Foot) 1. Cleansed with: Clean  wound with Normal Saline 2. Anesthetic Topical Lidocaine 4% cream to wound bed prior to debridement 4. Dressing Applied: Prisma Ag 5. Secondary Dressing Applied Bordered Foam Dressing Electronic Signature(s) Signed: 11/21/2017 6:21:51 PM By: Gretta Cool, BSN, RN, CWS, Kim RN, BSN Entered By: Gretta Cool, BSN, RN, CWS, Kim on 11/21/2017 25:85:27 [name] ([id number]) -------------------------------------------------------------------------------- Wound Assessment Details Patient Name: [name], [name]. Date of Service: 11/21/2017 8:45 AM Medical Record Number: [id number] Patient Account  Number: [id number] Date of Birth/Sex: 09-08-1960 (56 y.o. M) Treating RN: Cornell Barman Primary Care Jhamari Markowicz: Lorelee Market Other Clinician: Referring Lynnett Langlinais: Lorelee Market Treating Ernesteen Mihalic/Extender: Melburn Hake, HOYT Weeks in Treatment: 2 Wound Status Wound Number: 13 Primary Etiology: Diabetic Wound/Ulcer of the Lower Extremity Wound Location: Right Metatarsal head first - Plantar Wound Status: Open Wounding Event: Gradually Appeared Comorbid Hypertension, Type II Diabetes, Date Acquired: 10/24/2017 History: Neuropathy Weeks Of Treatment: 2 Clustered Wound: No Photos Photo Uploaded By: Gretta Cool, BSN, RN, CWS, Kim on 11/21/2017 17:55:49 Wound Measurements Length: (cm) 1.4 Width: (cm) 3.6 Depth: (cm) 0.4 Area: (cm) 3.958 Volume: (cm) 1.583 % Reduction in Area: -8321.3% % Reduction in Volume: -11207.1% Epithelialization: None Tunneling: No Undermining: No Wound Description Classification: Grade 1 Wound Margin: Distinct, outline attached Exudate Amount: Large Exudate Type: Serous Exudate Color: amber Foul Odor After Cleansing: No Slough/Fibrino Yes Wound Bed Granulation Amount: Medium (34-66%) Exposed Structure Granulation Quality: Red, Pink Fascia Exposed: No Necrotic Amount: Medium (34-66%) Fat Layer (Subcutaneous Tissue) Exposed: Yes Necrotic Quality: Eschar, Adherent  Slough Tendon Exposed: No Muscle Exposed: No Joint Exposed: No Bone Exposed: No Periwound Skin Texture [name], [name]. ([id number]) Texture Color No Abnormalities Noted: No No Abnormalities Noted: No Callus: Yes Atrophie Blanche: No Crepitus: No Cyanosis: No Excoriation: No Ecchymosis: No Induration: No Erythema: No Rash: No Hemosiderin Staining: No Scarring: No Mottled: No Pallor: No Moisture Rubor: No No Abnormalities Noted: No Dry / Scaly: No Temperature / Pain Maceration: Yes Temperature: No Abnormality Tenderness on Palpation: Yes Wound Preparation Ulcer Cleansing: Rinsed/Irrigated with Saline Topical Anesthetic Applied: Other: lidocaine 4%, Treatment Notes Wound #13 (Right, Plantar Metatarsal head first) 1. Cleansed with: Clean wound with Normal Saline 2. Anesthetic Topical Lidocaine 4% cream to wound bed prior to debridement 4. Dressing Applied: Prisma Ag 5. Secondary Dressing Applied Bordered Foam Dressing Electronic Signature(s) Signed: 11/21/2017 6:21:51 PM By: Gretta Cool, BSN, RN, CWS, Kim RN, BSN Entered By: Gretta Cool, BSN, RN, CWS, Kim on 11/21/2017 09:10:02 [name] ([id number]) -------------------------------------------------------------------------------- [place] Details Patient Name: [name], [name]. Date of Service: 11/21/2017 8:45 AM Medical Record Number: [id number] Patient Account Number: [id number] Date of Birth/Sex: February 18, 1961 (57 y.o. M) Treating RN: Cornell Barman Primary Care Danie Diehl: Lorelee Market Other Clinician: Referring Javarian Jakubiak: Lorelee Market Treating Dahlton Hinde/Extender: Melburn Hake, HOYT Weeks in Treatment: 2 Vital Signs Time Taken: 09:01 Temperature (F): 98.2 Height (in): 70 Pulse (bpm): 93 Weight (lbs): 265 Respiratory Rate (breaths/min): 18 Body Mass Index (BMI): 38 Blood Pressure (mmHg): 136/68 Reference Range: 80 - 120 mg / dl Electronic Signature(s) Signed: 11/21/2017 6:21:51 PM By: Gretta Cool, BSN, RN, CWS, Kim  RN, BSN Entered By: Gretta Cool, BSN, RN, CWS, Kim on 11/21/2017 09:01:44

## 2017-11-28 ENCOUNTER — Inpatient Hospital Stay
Admission: EM | Admit: 2017-11-28 | Discharge: 2017-12-05 | DRG: 617 | Disposition: A | Discharge status: Skilled Nursing Facility | Payer: BLUE CROSS/BLUE SHIELD | Source: Ambulatory Visit | Attending: Specialist | Admitting: Specialist

## 2017-11-28 ENCOUNTER — Emergency Department: Payer: BLUE CROSS/BLUE SHIELD

## 2017-11-28 ENCOUNTER — Encounter: Payer: BLUE CROSS/BLUE SHIELD | Admitting: Physician Assistant

## 2017-11-28 ENCOUNTER — Other Ambulatory Visit: Payer: Self-pay

## 2017-11-28 DIAGNOSIS — Z7982 Long term (current) use of aspirin: Secondary | ICD-10-CM

## 2017-11-28 DIAGNOSIS — E11621 Type 2 diabetes mellitus with foot ulcer: Secondary | ICD-10-CM | POA: Diagnosis present

## 2017-11-28 DIAGNOSIS — M869 Osteomyelitis, unspecified: Secondary | ICD-10-CM | POA: Diagnosis present

## 2017-11-28 DIAGNOSIS — I10 Essential (primary) hypertension: Secondary | ICD-10-CM | POA: Diagnosis present

## 2017-11-28 DIAGNOSIS — G4733 Obstructive sleep apnea (adult) (pediatric): Secondary | ICD-10-CM | POA: Diagnosis present

## 2017-11-28 DIAGNOSIS — L089 Local infection of the skin and subcutaneous tissue, unspecified: Secondary | ICD-10-CM | POA: Diagnosis present

## 2017-11-28 DIAGNOSIS — I70299 Other atherosclerosis of native arteries of extremities, unspecified extremity: Secondary | ICD-10-CM

## 2017-11-28 DIAGNOSIS — Z833 Family history of diabetes mellitus: Secondary | ICD-10-CM

## 2017-11-28 DIAGNOSIS — L97909 Non-pressure chronic ulcer of unspecified part of unspecified lower leg with unspecified severity: Secondary | ICD-10-CM

## 2017-11-28 DIAGNOSIS — E1142 Type 2 diabetes mellitus with diabetic polyneuropathy: Secondary | ICD-10-CM | POA: Diagnosis present

## 2017-11-28 DIAGNOSIS — T148XXA Other injury of unspecified body region, initial encounter: Secondary | ICD-10-CM | POA: Diagnosis not present

## 2017-11-28 DIAGNOSIS — M86171 Other acute osteomyelitis, right ankle and foot: Secondary | ICD-10-CM | POA: Diagnosis present

## 2017-11-28 DIAGNOSIS — L97519 Non-pressure chronic ulcer of other part of right foot with unspecified severity: Secondary | ICD-10-CM | POA: Diagnosis present

## 2017-11-28 DIAGNOSIS — N400 Enlarged prostate without lower urinary tract symptoms: Secondary | ICD-10-CM | POA: Diagnosis present

## 2017-11-28 DIAGNOSIS — E785 Hyperlipidemia, unspecified: Secondary | ICD-10-CM | POA: Diagnosis present

## 2017-11-28 DIAGNOSIS — L03116 Cellulitis of left lower limb: Secondary | ICD-10-CM | POA: Diagnosis present

## 2017-11-28 DIAGNOSIS — E1169 Type 2 diabetes mellitus with other specified complication: Secondary | ICD-10-CM | POA: Diagnosis not present

## 2017-11-28 DIAGNOSIS — L97529 Non-pressure chronic ulcer of other part of left foot with unspecified severity: Secondary | ICD-10-CM | POA: Diagnosis present

## 2017-11-28 DIAGNOSIS — E1151 Type 2 diabetes mellitus with diabetic peripheral angiopathy without gangrene: Secondary | ICD-10-CM | POA: Diagnosis present

## 2017-11-28 DIAGNOSIS — L03115 Cellulitis of right lower limb: Secondary | ICD-10-CM | POA: Diagnosis present

## 2017-11-28 DIAGNOSIS — E11628 Type 2 diabetes mellitus with other skin complications: Secondary | ICD-10-CM | POA: Diagnosis present

## 2017-11-28 DIAGNOSIS — Z794 Long term (current) use of insulin: Secondary | ICD-10-CM

## 2017-11-28 LAB — CBC WITH DIFFERENTIAL/PLATELET
Basophils Absolute: 0 10*3/uL (ref 0–0.1)
Basophils Relative: 1 %
Eosinophils Absolute: 0.2 10*3/uL (ref 0–0.7)
Eosinophils Relative: 2 %
HEMATOCRIT: 40.5 % (ref 40.0–52.0)
HEMOGLOBIN: 14 g/dL (ref 13.0–18.0)
LYMPHS ABS: 1.7 10*3/uL (ref 1.0–3.6)
LYMPHS PCT: 16 %
MCH: 32 pg (ref 26.0–34.0)
MCHC: 34.5 g/dL (ref 32.0–36.0)
MCV: 92.5 fL (ref 80.0–100.0)
MONOS PCT: 11 %
Monocytes Absolute: 1.1 10*3/uL — ABNORMAL HIGH (ref 0.2–1.0)
NEUTROS PCT: 70 %
Neutro Abs: 7.3 10*3/uL — ABNORMAL HIGH (ref 1.4–6.5)
Platelets: 242 10*3/uL (ref 150–440)
RBC: 4.38 MIL/uL — AB (ref 4.40–5.90)
RDW: 13.3 % (ref 11.5–14.5)
WBC: 10.3 10*3/uL (ref 3.8–10.6)

## 2017-11-28 LAB — SEDIMENTATION RATE: SED RATE: 74 mm/h — AB (ref 0–20)

## 2017-11-28 LAB — COMPREHENSIVE METABOLIC PANEL
ALT: 15 U/L — AB (ref 17–63)
AST: 17 U/L (ref 15–41)
Albumin: 3.4 g/dL — ABNORMAL LOW (ref 3.5–5.0)
Alkaline Phosphatase: 105 U/L (ref 38–126)
Anion gap: 10 (ref 5–15)
BUN: 15 mg/dL (ref 6–20)
CHLORIDE: 97 mmol/L — AB (ref 101–111)
CO2: 27 mmol/L (ref 22–32)
CREATININE: 0.82 mg/dL (ref 0.61–1.24)
Calcium: 8.9 mg/dL (ref 8.9–10.3)
GFR calc non Af Amer: >60 mL/min (ref >60)
Glucose, Bld: 196 mg/dL — ABNORMAL HIGH (ref 65–99)
POTASSIUM: 3.6 mmol/L (ref 3.5–5.1)
SODIUM: 134 mmol/L — AB (ref 135–145)
Total Bilirubin: 0.5 mg/dL (ref 0.3–1.2)
Total Protein: 8.3 g/dL — ABNORMAL HIGH (ref 6.5–8.1)

## 2017-11-28 LAB — LACTIC ACID, PLASMA: LACTIC ACID, VENOUS: 0.9 mmol/L (ref 0.5–1.9)

## 2017-11-28 MED ORDER — CLINDAMYCIN PHOSPHATE 900 MG/50ML IV SOLN: 900.0000 mg | Freq: Once | INTRAVENOUS | Status: DC

## 2017-11-28 MED ORDER — SODIUM CHLORIDE 0.9 % IV SOLN
1.0000 g | Freq: Once | INTRAVENOUS | Status: AC
  Administered 2017-11-29: 1 g via INTRAVENOUS
  Filled 2017-11-28: qty 1

## 2017-11-28 NOTE — ED Provider Notes (Addendum)
[facility] Emergency Department Provider Note   First MD Initiated Contact with Patient 11/28/17 2301     (approximate)  I have reviewed the triage vital signs and the nursing notes.   HISTORY  Chief Complaint Wound Infection    HPI [name] is a 57 y.o. male list of chronic medical conditions including diabetes mellitus referred to the emergency department from the wound care clinic secondary to bilateral ulcers on the feet with request for IV antibiotics per the wound care center.  Patient admits to increasing pain in his left foot.  Patient denies any fever.   Past Medical History:  Diagnosis Date  . Arthritis   . BPH (benign prostatic hypertrophy)   . Diabetes mellitus   . Diabetic neuropathy ([place])   . Hypertension   . Metatarsal bone fracture    left 5th toe  . Osteomyelitis of toe of right foot ([place])   . Post-operative infection    and diabetic ulcer left foot  . Sleep apnea     does not wear CPAP  . Wears glasses     Patient Active Problem List   Diagnosis Date Noted  . Cellulitis of left foot 03/05/2017  . Surgery, elective   . Hardware complicating wound infection ([place])   . Diabetes mellitus due to underlying condition, uncontrolled, with diabetic neuropathy ([place])   . Metatarsal stress fracture of left foot 01/12/2016  . Diabetes mellitus due to underlying condition with diabetic polyneuropathy ([place]) 01/12/2016  . Diabetic infection of left foot ([place]) 01/12/2016  . Diabetic ulcer of foot associated with type 2 diabetes mellitus, with necrosis of muscle ([place]) 01/11/2016    Past Surgical History:  Procedure Laterality Date  . COLONOSCOPY W/ BIOPSIES AND POLYPECTOMY    . I&D EXTREMITY Left 01/11/2016   Procedure: LEFT FOOT IRRIGATION AND DEBRIDEMENT WOUND VAC AND REMOVAL OF HARDWARE;  Surgeon: Wylene Simmer, MD;  Location: [place];  Service: Orthopedics;  Laterality: Left;  . ORIF TOE FRACTURE Left 06/08/2015   Procedure: OPEN  REDUCTION INTERNAL FIXATION (ORIF) LEFT FIFTH METATARSAL BASE FRACTURE NONUNION; CALCANEAL AUTOGRAFT ;  Surgeon: Wylene Simmer, MD;  Location: [place];  Service: Orthopedics;  Laterality: Left;  . PATELLA RECONSTRUCTION Left 2005  . PROSTATE ABLATION  2014  . TOE AMPUTATION     partial amputation right great toe    Prior to Admission medications   Medication Sig Start Date End Date Taking? Authorizing Provider  aspirin EC 81 MG tablet Take 81 mg by mouth daily.    [provider]  docusate sodium (COLACE) 100 MG capsule Take 1 capsule (100 mg total) by mouth 2 (two) times daily. While taking narcotic pain medicine. 01/12/16   Corky Sing, PA-C  doxycycline (MONODOX) 100 MG capsule Take 1 capsule (100 mg total) by mouth 2 (two) times daily. 08/30/17   Sable Feil, PA-C  FARXIGA 10 MG TABS tablet Take 1 tablet by mouth daily. 03/03/17   [provider]  gabapentin (NEURONTIN) 400 MG capsule Take 400 mg by mouth 2 (two) times daily.    [provider]  insulin aspart (NOVOLOG) 100 UNIT/ML injection Inject 12-20 Units into the skin 3 (three) times daily before meals.    [provider]  Insulin Glargine (BASAGLAR KWIKPEN) 100 UNIT/ML SOPN Inject 0.35 mLs (35 Units total) into the skin at bedtime. 05/21/17   Renato Shin, MD  insulin lispro (HUMALOG KWIKPEN) 100 UNIT/ML KiwkPen Inject 0.15 mLs (15 Units  total) into the skin 3 (three) times daily with meals. And pen needles 4/day Patient not taking: Reported on 03/05/2017 04/30/16   Renato Shin, MD  losartan-hydrochlorothiazide (HYZAAR) 100-25 MG tablet Take 1 tablet by mouth daily. 12/24/15   [provider]  metFORMIN (GLUCOPHAGE) 1000 MG tablet Take 1,000 mg by mouth 2 (two) times daily. 10/21/15   [provider]  multivitamin-iron-minerals-folic acid (CENTRUM) chewable tablet Chew 1 tablet by mouth daily.    [provider]  oxyCODONE (OXY IR/ROXICODONE) 5 MG  immediate release tablet Take 1 tablet (5 mg total) by mouth every 6 (six) hours as needed for moderate pain. 03/07/17   Vaughan Basta, MD  senna (SENOKOT) 8.6 MG TABS tablet Take 2 tablets (17.2 mg total) by mouth 2 (two) times daily. 01/12/16   Corky Sing, PA-C  simvastatin (ZOCOR) 20 MG tablet TAKE 1 TABLET BY MOUTH IN THE EVENING 05/02/16   Renato Shin, MD  TRADJENTA 5 MG TABS tablet Take 5 mg by mouth daily. 03/01/17   [provider]  traMADol (ULTRAM) 50 MG tablet Take 1 tablet (50 mg total) by mouth every 6 (six) hours as needed. 11/05/17 11/05/18  Schuyler Amor, MD    Allergies Bactrim [sulfamethoxazole-trimethoprim]  Family History  Problem Relation Age of Onset  . Diabetes Mother   . Diabetes Other     Social History Social History   Tobacco Use  . Smoking status: Never Smoker  . Smokeless tobacco: Never Used  Substance Use Topics  . Alcohol use: Yes    Comment: occasional   . Drug use: No    Review of Systems Constitutional: No fever/chills Eyes: No visual changes. ENT: No sore throat. Cardiovascular: Denies chest pain. Respiratory: Denies shortness of breath. Gastrointestinal: No abdominal pain.  No nausea, no vomiting.  No diarrhea.  No constipation. Genitourinary: Negative for dysuria. Musculoskeletal: Negative for neck pain.  Negative for back pain.  Positive for bilateral ulcers on the feet. Integumentary: Negative for rash. Neurological: Negative for headaches, focal weakness or numbness.  ____________________________________________   PHYSICAL EXAM:  VITAL SIGNS: ED Triage Vitals [11/28/17 1928]  Enc Vitals Group     BP (!) 162/83     Pulse Rate 71     Resp 16     Temp 99.1 F (37.3 C)     Temp Source Oral     SpO2 96 %     Weight 117.9 kg (260 lb)     Height 1.778 m (5\' 10" )     Head Circumference      Peak Flow      Pain Score 10     Pain Loc      Pain Edu?      Excl. in [place]?     Constitutional: Alert and  oriented. Well appearing and in no acute distress. Eyes: Conjunctivae are normal.  Head: Atraumatic. Mouth/Throat: Mucous membranes are moist. Oropharynx non-erythematous. Neck: No stridor.  Cardiovascular: Normal rate, regular rhythm. Good peripheral circulation. Grossly normal heart sounds. Respiratory: Normal respiratory effort.  No retractions. Lungs CTAB. Gastrointestinal: Soft and nontender. No distention.  Musculoskeletal: Ulcer noted at the base of the left fifth metatarsal with active purulent drainage and malodorous nature.  Also noted base of the right fifth tarsal again with purulent drainage.. Neurologic:  Normal speech and language. No gross focal neurologic deficits are appreciated.  Skin: BiLateral fifth metatarsal ulcers with purulent drainage. Psychiatric: Mood and affect are normal. Speech and behavior are normal.  ____________________________________________  LABS (all labs ordered are listed, but only abnormal results are displayed)  Labs Reviewed  COMPREHENSIVE METABOLIC PANEL - Abnormal; Notable for the following components:      Result Value   Sodium 134 (*)    Chloride 97 (*)    Glucose, Bld 196 (*)    Total Protein 8.3 (*)    Albumin 3.4 (*)    ALT 15 (*)    All other components within normal limits  CBC WITH DIFFERENTIAL/PLATELET - Abnormal; Notable for the following components:   RBC 4.38 (*)    Neutro Abs 7.3 (*)    Monocytes Absolute 1.1 (*)    All other components within normal limits  SEDIMENTATION RATE - Abnormal; Notable for the following components:   Sed Rate 74 (*)    All other components within normal limits  LACTIC ACID, PLASMA     RADIOLOGY I, Crawford N Aashvi Rezabek, personally viewed and evaluated these images (plain radiographs) as part of my medical decision making, as well as reviewing the written report by the radiologist.  ED MD interpretation: Left foot x-ray revealed no evidence of osteomyelitis per radiologist.  Official  radiology report(s): Dg Foot Complete Left  Result Date: 11/28/2017 CLINICAL DATA:  Diabetic foot ulcer EXAM: LEFT FOOT - COMPLETE 3+ VIEW COMPARISON:  None. FINDINGS: No fracture or dislocation is seen. The joint spaces are preserved. Mild cortical thickening/irregularity involving the base of the 5th metatarsal, chronic. No definite erosions. Overlying soft tissue swelling/ulceration. IMPRESSION: Soft tissue swelling/ulceration along the lateral base of the 5th metatarsal. No definite radiographic findings to suggest acute osteomyelitis. Electronically Signed   By: Julian Hy M.D.   On: 11/28/2017 21:56     Procedures   ____________________________________________   INITIAL IMPRESSION / ASSESSMENT AND PLAN / ED COURSE  As part of my medical decision making, I reviewed the following data within the electronic MEDICAL RECORD NUMBER   57 year old male presented with above-stated history and physical exam secondary to bilateral ulcers on the feet.  Patient was referred to the emergency department for IV antibiotics.  Patient will be given IV Cefepime discussed with Dr. Jannifer Franklin for hospital admission for further evaluation and management. ____________________________________________  FINAL CLINICAL IMPRESSION(S) / ED DIAGNOSES  Final diagnoses:  Wound infection     MEDICATIONS GIVEN DURING THIS VISIT:  Medications  clindamycin (CLEOCIN) IVPB 900 mg (has no administration in time range)     ED Discharge Orders    None       Note:  This document was prepared using Dragon voice recognition software and may include unintentional dictation errors.    Gregor Hams, MD 11/28/17 Darci Needle    Gregor Hams, MD 11/28/17 [phone]

## 2017-11-28 NOTE — ED Triage Notes (Signed)
Pt was seen at wound care today for bilateral foot ulcers. Ulcers are wrapped currently, pt just left wound care. Pt was told by wound care md to come to ed for iv antibiotics. Per notes sent by wound care pt is developing an abscess beneath left foot plantar wound.

## 2017-11-29 ENCOUNTER — Observation Stay: Payer: BLUE CROSS/BLUE SHIELD

## 2017-11-29 ENCOUNTER — Other Ambulatory Visit: Payer: Self-pay

## 2017-11-29 DIAGNOSIS — E1142 Type 2 diabetes mellitus with diabetic polyneuropathy: Secondary | ICD-10-CM | POA: Diagnosis present

## 2017-11-29 DIAGNOSIS — L97519 Non-pressure chronic ulcer of other part of right foot with unspecified severity: Secondary | ICD-10-CM | POA: Diagnosis present

## 2017-11-29 DIAGNOSIS — I70238 Atherosclerosis of native arteries of right leg with ulceration of other part of lower right leg: Secondary | ICD-10-CM | POA: Diagnosis not present

## 2017-11-29 DIAGNOSIS — N400 Enlarged prostate without lower urinary tract symptoms: Secondary | ICD-10-CM | POA: Diagnosis present

## 2017-11-29 DIAGNOSIS — G4733 Obstructive sleep apnea (adult) (pediatric): Secondary | ICD-10-CM | POA: Diagnosis present

## 2017-11-29 DIAGNOSIS — L03115 Cellulitis of right lower limb: Secondary | ICD-10-CM | POA: Diagnosis present

## 2017-11-29 DIAGNOSIS — Z7982 Long term (current) use of aspirin: Secondary | ICD-10-CM | POA: Diagnosis not present

## 2017-11-29 DIAGNOSIS — L97529 Non-pressure chronic ulcer of other part of left foot with unspecified severity: Secondary | ICD-10-CM | POA: Diagnosis present

## 2017-11-29 DIAGNOSIS — E11628 Type 2 diabetes mellitus with other skin complications: Secondary | ICD-10-CM | POA: Diagnosis present

## 2017-11-29 DIAGNOSIS — T148XXA Other injury of unspecified body region, initial encounter: Secondary | ICD-10-CM | POA: Diagnosis present

## 2017-11-29 DIAGNOSIS — E1169 Type 2 diabetes mellitus with other specified complication: Secondary | ICD-10-CM | POA: Diagnosis present

## 2017-11-29 DIAGNOSIS — E1151 Type 2 diabetes mellitus with diabetic peripheral angiopathy without gangrene: Secondary | ICD-10-CM | POA: Diagnosis present

## 2017-11-29 DIAGNOSIS — Z833 Family history of diabetes mellitus: Secondary | ICD-10-CM | POA: Diagnosis not present

## 2017-11-29 DIAGNOSIS — E785 Hyperlipidemia, unspecified: Secondary | ICD-10-CM | POA: Diagnosis present

## 2017-11-29 DIAGNOSIS — M86171 Other acute osteomyelitis, right ankle and foot: Secondary | ICD-10-CM | POA: Diagnosis present

## 2017-11-29 DIAGNOSIS — L03116 Cellulitis of left lower limb: Secondary | ICD-10-CM | POA: Diagnosis present

## 2017-11-29 DIAGNOSIS — I10 Essential (primary) hypertension: Secondary | ICD-10-CM | POA: Diagnosis present

## 2017-11-29 DIAGNOSIS — I70248 Atherosclerosis of native arteries of left leg with ulceration of other part of lower left leg: Secondary | ICD-10-CM | POA: Diagnosis not present

## 2017-11-29 DIAGNOSIS — M869 Osteomyelitis, unspecified: Secondary | ICD-10-CM | POA: Diagnosis present

## 2017-11-29 DIAGNOSIS — E11621 Type 2 diabetes mellitus with foot ulcer: Secondary | ICD-10-CM | POA: Diagnosis present

## 2017-11-29 DIAGNOSIS — Z794 Long term (current) use of insulin: Secondary | ICD-10-CM | POA: Diagnosis not present

## 2017-11-29 LAB — GLUCOSE, CAPILLARY
GLUCOSE-CAPILLARY: 117 mg/dL — AB (ref 65–99)
GLUCOSE-CAPILLARY: 208 mg/dL — AB (ref 65–99)
GLUCOSE-CAPILLARY: 198 mg/dL — AB (ref 65–99)
Glucose-Capillary: 136 mg/dL — ABNORMAL HIGH (ref 65–99)
Glucose-Capillary: 112 mg/dL — ABNORMAL HIGH (ref 65–99)

## 2017-11-29 LAB — BASIC METABOLIC PANEL
ANION GAP: 9 (ref 5–15)
BUN: 10 mg/dL (ref 6–20)
CHLORIDE: 102 mmol/L (ref 101–111)
CO2: 25 mmol/L (ref 22–32)
Calcium: 8.4 mg/dL — ABNORMAL LOW (ref 8.9–10.3)
Creatinine, Ser: 0.64 mg/dL (ref 0.61–1.24)
GFR calc non Af Amer: >60 mL/min (ref >60)
Glucose, Bld: 109 mg/dL — ABNORMAL HIGH (ref 65–99)
Potassium: 3.4 mmol/L — ABNORMAL LOW (ref 3.5–5.1)
Sodium: 136 mmol/L (ref 135–145)

## 2017-11-29 LAB — CBC
HCT: 37.1 % — ABNORMAL LOW (ref 40.0–52.0)
HEMOGLOBIN: 12.8 g/dL — AB (ref 13.0–18.0)
MCH: 32.1 pg (ref 26.0–34.0)
MCHC: 34.5 g/dL (ref 32.0–36.0)
MCV: 92.9 fL (ref 80.0–100.0)
Platelets: 210 10*3/uL (ref 150–440)
RBC: 3.99 MIL/uL — AB (ref 4.40–5.90)
RDW: 13.2 % (ref 11.5–14.5)
WBC: 7.5 10*3/uL (ref 3.8–10.6)

## 2017-11-29 LAB — HEMOGLOBIN A1C
Hgb A1c MFr Bld: 8.8 % — ABNORMAL HIGH (ref 4.8–5.6)
Mean Plasma Glucose: 205.86 mg/dL

## 2017-11-29 MED ORDER — VANCOMYCIN HCL 10 G IV SOLR
1250.0000 mg | Freq: Three times a day (TID) | INTRAVENOUS | Status: DC
  Administered 2017-11-29 – 2017-12-01 (×8): 1250 mg via INTRAVENOUS
  Filled 2017-11-29 (×10): qty 1250

## 2017-11-29 MED ORDER — OXYCODONE HCL 5 MG PO TABS
5.0000 mg | ORAL_TABLET | ORAL | Status: DC | PRN
  Administered 2017-11-29 (×2): 5 mg via ORAL
  Filled 2017-11-29 (×2): qty 1

## 2017-11-29 MED ORDER — VANCOMYCIN HCL IN DEXTROSE 1-5 GM/200ML-% IV SOLN
1000.0000 mg | INTRAVENOUS | Status: AC
  Administered 2017-11-29: 1000 mg via INTRAVENOUS
  Filled 2017-11-29: qty 200

## 2017-11-29 MED ORDER — SODIUM CHLORIDE 0.9 % IV SOLN
2.0000 g | Freq: Three times a day (TID) | INTRAVENOUS | Status: DC
  Administered 2017-11-29 – 2017-12-03 (×12): 2 g via INTRAVENOUS
  Filled 2017-11-29 (×17): qty 2

## 2017-11-29 MED ORDER — GADOBENATE DIMEGLUMINE 529 MG/ML IV SOLN
20.0000 mL | Freq: Once | INTRAVENOUS | Status: AC | PRN
  Administered 2017-11-29: 20 mL via INTRAVENOUS

## 2017-11-29 MED ORDER — ONDANSETRON HCL 4 MG PO TABS: 4.0000 mg | ORAL_TABLET | Freq: Four times a day (QID) | ORAL | Status: DC | PRN

## 2017-11-29 MED ORDER — MORPHINE SULFATE (PF) 2 MG/ML IV SOLN
2.0000 mg | Freq: Once | INTRAVENOUS | Status: AC
  Administered 2017-11-29: 2 mg via INTRAVENOUS
  Filled 2017-11-29: qty 1

## 2017-11-29 MED ORDER — ACETAMINOPHEN 325 MG PO TABS: 650.0000 mg | ORAL_TABLET | Freq: Four times a day (QID) | ORAL | Status: DC | PRN

## 2017-11-29 MED ORDER — LOSARTAN POTASSIUM 50 MG PO TABS
50.0000 mg | ORAL_TABLET | Freq: Every day | ORAL | Status: DC
  Administered 2017-11-29 – 2017-12-05 (×6): 50 mg via ORAL
  Filled 2017-11-29 (×6): qty 1

## 2017-11-29 MED ORDER — ONDANSETRON HCL 4 MG/2ML IJ SOLN: 4.0000 mg | Freq: Four times a day (QID) | INTRAMUSCULAR | Status: DC | PRN

## 2017-11-29 MED ORDER — ENOXAPARIN SODIUM 40 MG/0.4ML ~~LOC~~ SOLN
40.0000 mg | SUBCUTANEOUS | Status: DC
  Administered 2017-11-29 – 2017-12-05 (×4): 40 mg via SUBCUTANEOUS
  Filled 2017-11-29 (×4): qty 0.4

## 2017-11-29 MED ORDER — ASPIRIN EC 81 MG PO TBEC
81.0000 mg | DELAYED_RELEASE_TABLET | Freq: Every day | ORAL | Status: DC
  Administered 2017-11-29 – 2017-12-05 (×4): 81 mg via ORAL
  Filled 2017-11-29 (×4): qty 1

## 2017-11-29 MED ORDER — INSULIN GLARGINE 100 UNIT/ML ~~LOC~~ SOLN
35.0000 [IU] | Freq: Every day | SUBCUTANEOUS | Status: DC
  Administered 2017-11-29 – 2017-12-04 (×6): 35 [IU] via SUBCUTANEOUS
  Filled 2017-11-29 (×8): qty 0.35

## 2017-11-29 MED ORDER — SIMVASTATIN 20 MG PO TABS
20.0000 mg | ORAL_TABLET | Freq: Every evening | ORAL | Status: DC
  Administered 2017-11-29 – 2017-12-04 (×6): 20 mg via ORAL
  Filled 2017-11-29 (×6): qty 1

## 2017-11-29 MED ORDER — INSULIN ASPART 100 UNIT/ML ~~LOC~~ SOLN
0.0000 [IU] | Freq: Three times a day (TID) | SUBCUTANEOUS | Status: DC
  Administered 2017-11-29: 3 [IU] via SUBCUTANEOUS
  Administered 2017-11-29: 5 [IU] via SUBCUTANEOUS
  Administered 2017-11-30: 2 [IU] via SUBCUTANEOUS
  Administered 2017-11-30 (×2): 8 [IU] via SUBCUTANEOUS
  Administered 2017-12-01: 3 [IU] via SUBCUTANEOUS
  Administered 2017-12-01 – 2017-12-03 (×3): 2 [IU] via SUBCUTANEOUS
  Administered 2017-12-03: 3 [IU] via SUBCUTANEOUS
  Administered 2017-12-03: 2 [IU] via SUBCUTANEOUS
  Administered 2017-12-04: 5 [IU] via SUBCUTANEOUS
  Administered 2017-12-04: 2 [IU] via SUBCUTANEOUS
  Filled 2017-11-29 (×13): qty 1

## 2017-11-29 MED ORDER — ACETAMINOPHEN 650 MG RE SUPP: 650.0000 mg | Freq: Four times a day (QID) | RECTAL | Status: DC | PRN

## 2017-11-29 NOTE — Consult Note (Signed)
[facility] Vascular Consult Note  MRN : [id number]  [name] is a 57 y.o. (28-Aug-1960) male who presents with chief complaint of bilateral feet ulcerations.  Chief Complaint  Patient presents with  . Wound Infection  .  History of Present Illness: Mr Knipple is a 57 yo diabetic male with chronic plantar ulcerations of both feet.  The left foot ulceration has been present off and on for several years.  The right foot ulceration has developed over the last month.  He was admitted for worsening pain and antibiotics for infection.  He denies prior vascular surgeries or procedures.  He has had a prior right toe amp for osteo.  Denies smoking/tobacco.  Current Facility-Administered Medications  Medication Dose Route Frequency Provider Last Rate Last Dose  . acetaminophen (TYLENOL) tablet 650 mg  650 mg Oral Q6H PRN Lance Coon, MD       Or  . acetaminophen (TYLENOL) suppository 650 mg  650 mg Rectal Q6H PRN Lance Coon, MD      . aspirin EC tablet 81 mg  81 mg Oral Daily Lance Coon, MD   81 mg at 11/29/17 0905  . ceFEPIme (MAXIPIME) 2 g in sodium chloride 0.9 % 100 mL IVPB  2 g Intravenous Q8H Pernell Dupre, RPH   Stopped at 11/29/17 8657  . enoxaparin (LOVENOX) injection 40 mg  40 mg Subcutaneous Q24H Lance Coon, MD   40 mg at 11/29/17 0905  . insulin aspart (novoLOG) injection 0-15 Units  0-15 Units Subcutaneous TID AC & HS Lance Coon, MD      . insulin glargine (LANTUS) injection 35 Units  35 Units Subcutaneous QHS Lance Coon, MD      . losartan (COZAAR) tablet 50 mg  50 mg Oral Daily Lance Coon, MD   50 mg at 11/29/17 0905  . ondansetron (ZOFRAN) tablet 4 mg  4 mg Oral Q6H PRN Lance Coon, MD       Or  . ondansetron [facility]) injection 4 mg  4 mg Intravenous Q6H PRN Lance Coon, MD      . oxyCODONE (Oxy IR/ROXICODONE) immediate release tablet 5 mg  5 mg Oral Q4H PRN Lance Coon, MD   5 mg at 11/29/17 1029  . simvastatin (ZOCOR)  tablet 20 mg  20 mg Oral QPM Lance Coon, MD      . vancomycin (VANCOCIN) 1,250 mg in sodium chloride 0.9 % 250 mL IVPB  1,250 mg Intravenous Q8H Hallaji, Sheema M, RPH 166.7 mL/hr at 11/29/17 0906 1,250 mg at 11/29/17 8469   Facility-Administered Medications Ordered in Other Encounters  Medication Dose Route Frequency Provider Last Rate Last Dose  . Tdap (BOOSTRIX) injection 0.5 mL  0.5 mL Intramuscular Once Betancourt, Aura Fey, NP        Past Medical History:  Diagnosis Date  . Arthritis   . BPH (benign prostatic hypertrophy)   . Diabetes mellitus   . Diabetic neuropathy ([place])   . Hypertension   . Metatarsal bone fracture    left 5th toe  . Osteomyelitis of toe of right foot ([place])   . Post-operative infection    and diabetic ulcer left foot  . Sleep apnea     does not wear CPAP  . Wears glasses     Past Surgical History:  Procedure Laterality Date  . COLONOSCOPY W/ BIOPSIES AND POLYPECTOMY    . I&D EXTREMITY Left 01/11/2016   Procedure: LEFT FOOT IRRIGATION AND DEBRIDEMENT WOUND VAC AND REMOVAL OF  HARDWARE;  Surgeon: Wylene Simmer, MD;  Location: [place];  Service: Orthopedics;  Laterality: Left;  . ORIF TOE FRACTURE Left 06/08/2015   Procedure: OPEN REDUCTION INTERNAL FIXATION (ORIF) LEFT FIFTH METATARSAL BASE FRACTURE NONUNION; CALCANEAL AUTOGRAFT ;  Surgeon: Wylene Simmer, MD;  Location: [place];  Service: Orthopedics;  Laterality: Left;  . PATELLA RECONSTRUCTION Left 2005  . PROSTATE ABLATION  2014  . TOE AMPUTATION     partial amputation right great toe    Social History Social History   Tobacco Use  . Smoking status: Never Smoker  . Smokeless tobacco: Never Used  Substance Use Topics  . Alcohol use: Yes    Comment: occasional   . Drug use: No    Family History Family History  Problem Relation Age of Onset  . Diabetes Mother   . Diabetes Other     Allergies  Allergen Reactions  . Bactrim [Sulfamethoxazole-Trimethoprim] Hives     REVIEW  OF SYSTEMS (Negative unless checked)  Constitutional: [] Weight loss  [] Fever  [] Chills Cardiac: [] Chest pain   [] Chest pressure   [] Palpitations   [] Shortness of breath when laying flat   [] Shortness of breath at rest   [] Shortness of breath with exertion. Vascular:  [] Pain in legs with walking   [] Pain in legs at rest   [] Pain in legs when laying flat   [] Claudication   [] Pain in feet when walking  [] Pain in feet at rest  [] Pain in feet when laying flat   [] History of DVT   [] Phlebitis   [] Swelling in legs   [] Varicose veins   [x] Non-healing ulcers Pulmonary:   [] Uses home oxygen   [] Productive cough   [] Hemoptysis   [] Wheeze  [] COPD   [] Asthma Neurologic:  [] Dizziness  [] Blackouts   [] Seizures   [] History of stroke   [] History of TIA  [] Aphasia   [] Temporary blindness   [] Dysphagia   [] Weakness or numbness in arms   [] Weakness or numbness in legs Musculoskeletal:  [] Arthritis   [] Joint swelling   [] Joint pain   [] Low back pain Hematologic:  [] Easy bruising  [] Easy bleeding   [] Hypercoagulable state   [] Anemic  [] Hepatitis Gastrointestinal:  [] Blood in stool   [] Vomiting blood  [] Gastroesophageal reflux/heartburn   [] Difficulty swallowing. Genitourinary:  [] Chronic kidney disease   [] Difficult urination  [] Frequent urination  [] Burning with urination   [] Blood in urine Skin:  [] Rashes   [] Ulcers   [x] Wounds Psychological:  [] History of anxiety   []  History of major depression.  Physical Examination  Vitals:   11/29/17 0100 11/29/17 0206 11/29/17 0210 11/29/17 0802  BP: (!) 153/83 (!) 148/88  136/78  Pulse: 87 86  84  Resp:  17  18  Temp:  98.6 F (37 C)  98.3 F (36.8 C)  TempSrc:  Oral    SpO2: 99% 98%  99%  Weight:   111.9 kg (246 lb 11.2 oz)   Height:       Body mass index is 35.4 kg/m. Gen:  WD/WN, NAD Head: [place]/AT, No temporalis wasting. Prominent temp pulse not noted. Ear/Nose/Throat: Hearing grossly intact, nares w/o erythema or drainage, oropharynx w/o Erythema/Exudate Eyes:  Sclera non-icteric, conjunctiva clear Neck: Trachea midline.  No JVD.  Pulmonary:  Good air movement, respirations not labored, equal bilaterally.  Cardiac: RRR, normal S1, S2. Vascular: Bilateral feet warm but nonpalpable pedal pulses, mild edema bilaterally Vessel Right Left  Femoral Palpable Palpable  PT Non Palpable Non Palpable  DP Non Palpable Non Palpable  Gastrointestinal: soft, non-tender/non-distended. No guarding/reflex.  Musculoskeletal: M/S 5/5 throughout.  Extremities without ischemic changes. Neurologic: Decreased sensation bilateral feet due to diabetic neuropathy.  Symmetrical.  Speech is fluent. Motor exam as listed above. Psychiatric: Judgment intact, Mood & affect appropriate for pt's clinical situation. Dermatologic: Bilateral diabetic foot wounds, tender to palpation, no drainage Lymph : No Cervical, Axillary, or Inguinal lymphadenopathy.      CBC Lab Results  Component Value Date   WBC 7.5 11/29/2017   HGB 12.8 (L) 11/29/2017   HCT 37.1 (L) 11/29/2017   MCV 92.9 11/29/2017   PLT 210 11/29/2017    BMET    Component Value Date/Time   NA 136 11/29/2017 0549   NA 134 (L) 03/26/2014 2050   K 3.4 (L) 11/29/2017 0549   K 4.3 03/26/2014 2050   CL 102 11/29/2017 0549   CL 103 03/26/2014 2050   CO2 25 11/29/2017 0549   CO2 29 03/26/2014 2050   GLUCOSE 109 (H) 11/29/2017 0549   GLUCOSE 150 (H) 03/26/2014 2050   BUN 10 11/29/2017 0549   BUN 18 03/26/2014 2050   CREATININE 0.64 11/29/2017 0549   CREATININE 0.86 03/26/2014 2050   CALCIUM 8.4 (L) 11/29/2017 0549   CALCIUM 8.7 03/26/2014 2050   GFRNONAA >60 11/29/2017 0549   GFRNONAA >60 03/26/2014 2050   GFRNONAA >60 10/29/2012 1757   GFRAA >60 11/29/2017 0549   GFRAA >60 03/26/2014 2050   GFRAA >60 10/29/2012 1757   Estimated Creatinine Clearance: 129.2 mL/min (by C-G formula based on SCr of 0.64 mg/dL).  COAG No results found for: INR, PROTIME  Radiology Dg Foot Complete Left  Result Date:  11/28/2017 CLINICAL DATA:  Diabetic foot ulcer EXAM: LEFT FOOT - COMPLETE 3+ VIEW COMPARISON:  None. FINDINGS: No fracture or dislocation is seen. The joint spaces are preserved. Mild cortical thickening/irregularity involving the base of the 5th metatarsal, chronic. No definite erosions. Overlying soft tissue swelling/ulceration. IMPRESSION: Soft tissue swelling/ulceration along the lateral base of the 5th metatarsal. No definite radiographic findings to suggest acute osteomyelitis. Electronically Signed   By: Julian Hy M.D.   On: 11/28/2017 21:56   Dg Foot Complete Left  Result Date: 11/05/2017 CLINICAL DATA:  Left foot pain and swelling with ulcers. EXAM: LEFT FOOT - COMPLETE 3+ VIEW COMPARISON:  Radiographs of August 30, 2017. FINDINGS: There is no evidence of fracture or dislocation. Joint spaces are intact. Mild posterior calcaneal spurring is noted. Soft tissue ulceration is seen near base of fifth metatarsal without underlying lytic destruction is suggest osteomyelitis. IMPRESSION: Soft tissue ulceration seen overlying proximal base of fifth metatarsal without evidence of lytic destruction to suggest underlying osteomyelitis. Electronically Signed   By: Marijo Conception, M.D.   On: 11/05/2017 12:29    Assessment/Plan 1. Peripheral vascular disease - nonpalpable pedal pulses and chronic diabetic foot ulcerations.  Recommend BLE angiogram for further evaluation and revascularization to promote wound healing.  Likely next week. 2.  Bilateral diabetic foot ulcerations, poss osteo - MRI and debridement per podiatry 3. Diabetic with associate neuropathy   Murray Hodgkins, MD  11/29/2017 11:27 AM

## 2017-11-29 NOTE — Progress Notes (Signed)
Patient ID: [name], male   DOB: 06-18-1961, 57 y.o.   MRN: [id number] Patient seen at bedside with his wife. Discussed with the patient the results of his MRIs on both feet.  Discussed with the patient that he does have in flexion in his fifth metatarsal on both feet.  Discussed the need to proceed with fifth ray resection on the right foot and debridement of the infected bone on the left.  We did discuss how this may affect his walking and ability to get around in the future, especially on the left foot as we will be taking at the base of the fifth metatarsal and his peroneal tendon  attachment.  Discussed that he may notice some increased curvature in the left foot.  Discussed risks and complications of the procedure including but not limited to continued infection in the soft tissues or bone in the feet as well as inability to heal from peripheral vascular disease.  Again discussed that he may have difficulty walking as well as the possibility of transfer lesions and increased foot deformity.  No guarantees can be given as to the outcome of the procedures.  Questions were invited and answered.  At this point we will plan for surgery on both feet tomorrow morning.  Obtain consent forms for fifth ray amputation on the right foot and debridement of bone and soft tissue on the left.  N.p.o. after midnight.  Plan for surgery tomorrow morning

## 2017-11-29 NOTE — ED Notes (Signed)
Pt is waiting for US guided IV at this time.

## 2017-11-29 NOTE — H&P (Signed)
[facility] NAME: [name]    MR#:  [id number]  DATE OF BIRTH:  11/10/1960  DATE OF ADMISSION:  11/28/2017  PRIMARY CARE PHYSICIAN: Lorelee Market, MD   REQUESTING/REFERRING PHYSICIAN: Owens Shark, MD  CHIEF COMPLAINT:   Chief Complaint  Patient presents with  . Wound Infection    HISTORY OF PRESENT ILLNESS:  [name]  is a 57 y.o. male who presents with worsening bilateral diabetic foot wounds.  Patient was seen in the wound clinic today where he is followed for these wounds, and there was some concern for possible development of abscess with surrounding cellulitis of left foot wound, as well as some drainage from right foot wound.  He was sent to the hospital for IV antibiotics and further evaluation.  Hospitalist were called for the same  PAST MEDICAL HISTORY:   Past Medical History:  Diagnosis Date  . Arthritis   . BPH (benign prostatic hypertrophy)   . Diabetes mellitus   . Diabetic neuropathy ([place])   . Hypertension   . Metatarsal bone fracture    left 5th toe  . Osteomyelitis of toe of right foot ([place])   . Post-operative infection    and diabetic ulcer left foot  . Sleep apnea     does not wear CPAP  . Wears glasses      PAST SURGICAL HISTORY:   Past Surgical History:  Procedure Laterality Date  . COLONOSCOPY W/ BIOPSIES AND POLYPECTOMY    . I&D EXTREMITY Left 01/11/2016   Procedure: LEFT FOOT IRRIGATION AND DEBRIDEMENT WOUND VAC AND REMOVAL OF HARDWARE;  Surgeon: Wylene Simmer, MD;  Location: [facility];  Service: Orthopedics;  Laterality: Left;  . ORIF TOE FRACTURE Left 06/08/2015   Procedure: OPEN REDUCTION INTERNAL FIXATION (ORIF) LEFT FIFTH METATARSAL BASE FRACTURE NONUNION; CALCANEAL AUTOGRAFT ;  Surgeon: Wylene Simmer, MD;  Location: [facility];  Service: Orthopedics;  Laterality: Left;  . PATELLA RECONSTRUCTION Left 2005  . PROSTATE ABLATION  2014  . TOE AMPUTATION     partial  amputation right great toe     SOCIAL HISTORY:   Social History   Tobacco Use  . Smoking status: Never Smoker  . Smokeless tobacco: Never Used  Substance Use Topics  . Alcohol use: Yes    Comment: occasional      FAMILY HISTORY:   Family History  Problem Relation Age of Onset  . Diabetes Mother   . Diabetes Other      DRUG ALLERGIES:   Allergies  Allergen Reactions  . Bactrim [Sulfamethoxazole-Trimethoprim] Hives    MEDICATIONS AT HOME:   Prior to Admission medications   Medication Sig Start Date End Date Taking? Authorizing Provider  aspirin EC 81 MG tablet Take 81 mg by mouth daily.   Yes [provider]  FARXIGA 10 MG TABS tablet Take 1 tablet by mouth daily. 03/03/17  Yes [provider]  insulin aspart (NOVOLOG) 100 UNIT/ML injection Inject 12-20 Units into the skin 3 (three) times daily before meals. Per sliding scale   Yes [provider]  Insulin Glargine (BASAGLAR KWIKPEN) 100 UNIT/ML SOPN Inject 0.35 mLs (35 Units total) into the skin at bedtime. 05/21/17  Yes Renato Shin, MD  losartan (COZAAR) 50 MG tablet Take 50 mg by mouth daily.   Yes [provider]  metFORMIN (GLUCOPHAGE) 1000 MG tablet Take 1,000 mg by mouth 2 (two) times daily. 10/21/15  Yes [provider]  simvastatin (ZOCOR)  20 MG tablet TAKE 1 TABLET BY MOUTH IN THE EVENING 05/02/16  Yes Renato Shin, MD  docusate sodium (COLACE) 100 MG capsule Take 1 capsule (100 mg total) by mouth 2 (two) times daily. While taking narcotic pain medicine. Patient not taking: Reported on 11/29/2017 01/12/16   Corky Sing, PA-C  doxycycline (MONODOX) 100 MG capsule Take 1 capsule (100 mg total) by mouth 2 (two) times daily. Patient not taking: Reported on 11/29/2017 08/30/17   Sable Feil, PA-C  insulin lispro (HUMALOG KWIKPEN) 100 UNIT/ML KiwkPen Inject 0.15 mLs (15 Units total) into the skin 3 (three) times daily with meals. And pen needles 4/day Patient not  taking: Reported on 03/05/2017 04/30/16   Renato Shin, MD  oxyCODONE (OXY IR/ROXICODONE) 5 MG immediate release tablet Take 1 tablet (5 mg total) by mouth every 6 (six) hours as needed for moderate pain. Patient not taking: Reported on 11/29/2017 03/07/17   Vaughan Basta, MD  senna (SENOKOT) 8.6 MG TABS tablet Take 2 tablets (17.2 mg total) by mouth 2 (two) times daily. Patient not taking: Reported on 11/29/2017 01/12/16   Corky Sing, PA-C  traMADol (ULTRAM) 50 MG tablet Take 1 tablet (50 mg total) by mouth every 6 (six) hours as needed. Patient not taking: Reported on 11/29/2017 11/05/17 11/05/18  Schuyler Amor, MD    REVIEW OF SYSTEMS:  Review of Systems  Constitutional: Negative for chills, fever, malaise/fatigue and weight loss.  HENT: Negative for ear pain, hearing loss and tinnitus.   Eyes: Negative for blurred vision, double vision, pain and redness.  Respiratory: Negative for cough, hemoptysis and shortness of breath.   Cardiovascular: Negative for chest pain, palpitations, orthopnea and leg swelling.  Gastrointestinal: Negative for abdominal pain, constipation, diarrhea, nausea and vomiting.  Genitourinary: Negative for dysuria, frequency and hematuria.  Musculoskeletal: Negative for back pain, joint pain and neck pain.       Bilateral foot pain with ulcers and surrounding erythema  Skin:       No acne or rash  Neurological: Negative for dizziness, tremors, focal weakness and weakness.  Endo/Heme/Allergies: Negative for polydipsia. Does not bruise/bleed easily.  Psychiatric/Behavioral: Negative for depression. The patient is not nervous/anxious and does not have insomnia.      VITAL SIGNS:   Vitals:   11/28/17 1928 11/28/17 2307  BP: (!) 162/83 (!) 162/83  Pulse: 71 93  Resp: 16 16  Temp: 99.1 F (37.3 C)   TempSrc: Oral   SpO2: 96% 100%  Weight: 117.9 kg (260 lb)   Height: 5\' 10"  (1.778 m)    Wt Readings from Last 3 Encounters:  11/28/17 117.9 kg (260 lb)   11/05/17 117.9 kg (260 lb)  11/04/17 117.9 kg (260 lb)    PHYSICAL EXAMINATION:  Physical Exam  Vitals reviewed. Constitutional: He is oriented to person, place, and time. He appears well-developed and well-nourished. No distress.  HENT:  Head: Normocephalic and atraumatic.  Mouth/Throat: Oropharynx is clear and moist.  Eyes: Pupils are equal, round, and reactive to light. Conjunctivae and EOM are normal. No scleral icterus.  Neck: Normal range of motion. Neck supple. No JVD present. No thyromegaly present.  Cardiovascular: Normal rate, regular rhythm and intact distal pulses. Exam reveals no gallop and no friction rub.  No murmur heard. Respiratory: Effort normal and breath sounds normal. No respiratory distress. He has no wheezes. He has no rales.  GI: Soft. Bowel sounds are normal. He exhibits no distension. There is no tenderness.  Musculoskeletal: Normal range  of motion. He exhibits no edema.  No arthritis, no gout  Lymphadenopathy:    He has no cervical adenopathy.  Neurological: He is alert and oriented to person, place, and time. No cranial nerve deficit.  No dysarthria, no aphasia  Skin: Skin is warm and dry. No rash noted. There is erythema (Surrounding left foot ulcer with small area of fluctuance, right foot ulcer with some drainage).  Psychiatric: He has a normal mood and affect. His behavior is normal. Judgment and thought content normal.    LABORATORY PANEL:   CBC Recent Labs  Lab 11/28/17 1934  WBC 10.3  HGB 14.0  HCT 40.5  PLT 242   ------------------------------------------------------------------------------------------------------------------  Chemistries  Recent Labs  Lab 11/28/17 1934  NA 134*  K 3.6  CL 97*  CO2 27  GLUCOSE 196*  BUN 15  CREATININE 0.82  CALCIUM 8.9  AST 17  ALT 15*  ALKPHOS 105  BILITOT 0.5   ------------------------------------------------------------------------------------------------------------------  Cardiac  Enzymes No results for input(s): TROPONINI in the last 168 hours. ------------------------------------------------------------------------------------------------------------------  RADIOLOGY:  Dg Foot Complete Left  Result Date: 11/28/2017 CLINICAL DATA:  Diabetic foot ulcer EXAM: LEFT FOOT - COMPLETE 3+ VIEW COMPARISON:  None. FINDINGS: No fracture or dislocation is seen. The joint spaces are preserved. Mild cortical thickening/irregularity involving the base of the 5th metatarsal, chronic. No definite erosions. Overlying soft tissue swelling/ulceration. IMPRESSION: Soft tissue swelling/ulceration along the lateral base of the 5th metatarsal. No definite radiographic findings to suggest acute osteomyelitis. Electronically Signed   By: Julian Hy M.D.   On: 11/28/2017 21:56    EKG:   Orders placed or performed during the hospital encounter of 06/08/15  . EKG 12 lead  . EKG 12 lead    IMPRESSION AND PLAN:  Principal Problem:   Diabetic foot infection ([place]) -IV antibiotics started, podiatry consult Active Problems:   Diabetes mellitus with diabetic polyneuropathy ([place]) -sliding scale insulin with corresponding glucose checks, continue home meds for neuropathy   HTN (hypertension) -continue home meds   HLD (hyperlipidemia) -Home dose antilipid  Chart review performed and case discussed with ED provider. Labs, imaging and/or ECG reviewed by provider and discussed with patient/family. Management plans discussed with the patient and/or family.  DVT PROPHYLAXIS: SubQ lovenox  GI PROPHYLAXIS: None  ADMISSION STATUS: Observation  CODE STATUS: Full Code Status History    Date Active Date Inactive Code Status Order ID Comments User Context   03/05/2017 1831 03/07/2017 1914 Full Code [id number]  Hillary Bow, MD ED   01/11/2016 1845 01/15/2016 1916 Full Code [id number]  Wylene Simmer, MD Inpatient      TOTAL TIME TAKING CARE OF THIS PATIENT: 40 minutes.   Kalyssa Anker  FIELDING 11/29/2017, 12:46 AM  [facility]  Office  [phone]  CC: Primary care physician; Lorelee Market, MD  Note:  This document was prepared using Dragon voice recognition software and may include unintentional dictation errors.

## 2017-11-29 NOTE — Consult Note (Signed)
Pharmacy Antibiotic Note  [name] is a 57 y.o. male admitted on 11/28/2017 with Diabetic foot infection/Wound infection.  Pharmacy has been consulted for Cefepime and Vancomycin  Dosing. Patient received Vancomycin 1g IV x 1 dose and Cefepime 1g IV in ED   Plan: Ke: 0.104    T1/2: 6.7  Vd: 63.7  DW: 91kg  Will start Vancomycin 1250 IV every 8 hours, with 6 hour stack dosing.  Calculated trough at Css is 16. Tough level ordered prior to 4th dose.   Start Cefepime 2g IV every 8 hours.    Height: 5\' 10"  (177.8 cm) Weight: 260 lb (117.9 kg) IBW/kg (Calculated) : 73  Temp (24hrs), Avg:99.1 F (37.3 C), Min:99.1 F (37.3 C), Max:99.1 F (37.3 C)  Recent Labs  Lab 11/28/17 1934  WBC 10.3  CREATININE 0.82  LATICACIDVEN 0.9    Estimated Creatinine Clearance: 129.5 mL/min (by C-G formula based on SCr of 0.82 mg/dL).    Allergies  Allergen Reactions  . Bactrim [Sulfamethoxazole-Trimethoprim] Hives    Antimicrobials this admission: 6/1 cefepime >>  6/1 vancomycin >>   Thank you for allowing pharmacy to be a part of this patient's care.  Pernell Dupre, PharmD, BCPS Clinical Pharmacist 11/29/2017 1:29 AM

## 2017-11-29 NOTE — Progress Notes (Signed)
Patient admitted this morning for diabetic foot infection on both right and left foot.,  Seen by Dr. Caryl Comes from podiatry, Dr. Andee Poles from vascular.  Patient right now on vancomycin, cefepime for cellulitis, patient went for MRI both feet to evaluate for osteomyelitis.  Patient had  excisional debridement of devitalized tissue from both feet. Labs, medications reviewed. 1.  Diabetic foot cellulitis, patient is on vancomycin, cefepime 2.  Diabetes mellitus type 2: Continue Lantus, sliding scale with coverage Time spent 20 minutes.

## 2017-11-29 NOTE — ED Notes (Signed)
This RN attempted IV stick x 1.

## 2017-11-29 NOTE — Consult Note (Signed)
Reason for Consult: Ulcerations bilateral feet with infection. Referring Physician: [name] is an 57 y.o. male.  HPI: This is a 57 year old male with a history of diabetes and associated neuropathy with chronic ulceration on his left foot on and off since 2016.  He has been managed by the wound care center.  Multiple previous admissions.  Recent development of ulceration on the right foot over the last month.  Worsening condition to the ulcerations and he was admitted for IV antibiotics and management.  Past Medical History:  Diagnosis Date  . Arthritis   . BPH (benign prostatic hypertrophy)   . Diabetes mellitus   . Diabetic neuropathy ([place])   . Hypertension   . Metatarsal bone fracture    left 5th toe  . Osteomyelitis of toe of right foot ([place])   . Post-operative infection    and diabetic ulcer left foot  . Sleep apnea     does not wear CPAP  . Wears glasses     Past Surgical History:  Procedure Laterality Date  . COLONOSCOPY W/ BIOPSIES AND POLYPECTOMY    . I&D EXTREMITY Left 01/11/2016   Procedure: LEFT FOOT IRRIGATION AND DEBRIDEMENT WOUND VAC AND REMOVAL OF HARDWARE;  Surgeon: Wylene Simmer, MD;  Location: [facility];  Service: Orthopedics;  Laterality: Left;  . ORIF TOE FRACTURE Left 06/08/2015   Procedure: OPEN REDUCTION INTERNAL FIXATION (ORIF) LEFT FIFTH METATARSAL BASE FRACTURE NONUNION; CALCANEAL AUTOGRAFT ;  Surgeon: Wylene Simmer, MD;  Location: [facility];  Service: Orthopedics;  Laterality: Left;  . PATELLA RECONSTRUCTION Left 2005  . PROSTATE ABLATION  2014  . TOE AMPUTATION     partial amputation right great toe    Family History  Problem Relation Age of Onset  . Diabetes Mother   . Diabetes Other     Social History:  reports that he has never smoked. He has never used smokeless tobacco. He reports that he drinks alcohol. He reports that he does not use drugs.  Allergies:  Allergies  Allergen Reactions  . Bactrim  [Sulfamethoxazole-Trimethoprim] Hives    Medications:  Scheduled: . aspirin EC  81 mg Oral Daily  . enoxaparin (LOVENOX) injection  40 mg Subcutaneous Q24H  . insulin aspart  0-15 Units Subcutaneous TID AC & HS  . insulin glargine  35 Units Subcutaneous QHS  . losartan  50 mg Oral Daily  . simvastatin  20 mg Oral QPM    Results for orders placed or performed during the hospital encounter of 11/28/17 (from the past 48 hour(s))  Lactic acid, plasma     Status: None   Collection Time: 11/28/17  7:34 PM  Result Value Ref Range   Lactic Acid, Venous 0.9 0.5 - 1.9 mmol/L    Comment: Performed at [facility], [address] [place], [place] [address]  Comprehensive metabolic panel     Status: Abnormal   Collection Time: 11/28/17  7:34 PM  Result Value Ref Range   Sodium 134 (L) 135 - 145 mmol/L   Potassium 3.6 3.5 - 5.1 mmol/L   Chloride 97 (L) 101 - 111 mmol/L   CO2 27 22 - 32 mmol/L   Glucose, Bld 196 (H) 65 - 99 mg/dL   BUN 15 6 - 20 mg/dL   Creatinine, Ser 0.82 0.61 - 1.24 mg/dL   Calcium 8.9 8.9 - 10.3 mg/dL   Total Protein 8.3 (H) 6.5 - 8.1 g/dL   Albumin 3.4 (L) 3.5 - 5.0 g/dL  AST 17 15 - 41 U/L   ALT 15 (L) 17 - 63 U/L   Alkaline Phosphatase 105 38 - 126 U/L   Total Bilirubin 0.5 0.3 - 1.2 mg/dL   GFR calc non Af Amer >60 >60 mL/min   GFR calc Af Amer >60 >60 mL/min    Comment: (NOTE) The eGFR has been calculated using the CKD EPI equation. This calculation has not been validated in all clinical situations. eGFR's persistently <60 mL/min signify possible Chronic Kidney Disease.    Anion gap 10 5 - 15    Comment: Performed at [facility], [address] [place], [place] [address]  CBC with Differential     Status: Abnormal   Collection Time: 11/28/17  7:34 PM  Result Value Ref Range   WBC 10.3 3.8 - 10.6 K/uL   RBC 4.38 (L) 4.40 - 5.90 MIL/uL   Hemoglobin 14.0 13.0 - 18.0 g/dL   HCT 40.5 40.0 - 52.0 %   MCV 92.5 80.0 - 100.0 fL   MCH  32.0 26.0 - 34.0 pg   MCHC 34.5 32.0 - 36.0 g/dL   RDW 13.3 11.5 - 14.5 %   Platelets 242 150 - 440 K/uL   Neutrophils Relative % 70 %   Neutro Abs 7.3 (H) 1.4 - 6.5 K/uL   Lymphocytes Relative 16 %   Lymphs Abs 1.7 1.0 - 3.6 K/uL   Monocytes Relative 11 %   Monocytes Absolute 1.1 (H) 0.2 - 1.0 K/uL   Eosinophils Relative 2 %   Eosinophils Absolute 0.2 0 - 0.7 K/uL   Basophils Relative 1 %   Basophils Absolute 0.0 0 - 0.1 K/uL    Comment: Performed at [facility], [address] [place], [place] [address]  Sedimentation rate     Status: Abnormal   Collection Time: 11/28/17  7:34 PM  Result Value Ref Range   Sed Rate 74 (H) 0 - 20 mm/hr    Comment: Performed at [facility], [address] [place], [place] [address]  Glucose, capillary     Status: Abnormal   Collection Time: 11/29/17  2:14 AM  Result Value Ref Range   Glucose-Capillary 136 (H) 65 - 99 mg/dL  Basic metabolic panel     Status: Abnormal   Collection Time: 11/29/17  5:49 AM  Result Value Ref Range   Sodium 136 135 - 145 mmol/L   Potassium 3.4 (L) 3.5 - 5.1 mmol/L   Chloride 102 101 - 111 mmol/L   CO2 25 22 - 32 mmol/L   Glucose, Bld 109 (H) 65 - 99 mg/dL   BUN 10 6 - 20 mg/dL   Creatinine, Ser 0.64 0.61 - 1.24 mg/dL   Calcium 8.4 (L) 8.9 - 10.3 mg/dL   GFR calc non Af Amer >60 >60 mL/min   GFR calc Af Amer >60 >60 mL/min    Comment: (NOTE) The eGFR has been calculated using the CKD EPI equation. This calculation has not been validated in all clinical situations. eGFR's persistently <60 mL/min signify possible Chronic Kidney Disease.    Anion gap 9 5 - 15    Comment: Performed at [facility], [address] [place], [place] [address]  CBC     Status: Abnormal   Collection Time: 11/29/17  5:49 AM  Result Value Ref Range   WBC 7.5 3.8 - 10.6 K/uL   RBC 3.99 (L) 4.40 - 5.90 MIL/uL   Hemoglobin 12.8 (L) 13.0 - 18.0 g/dL  HCT 37.1 (L) 40.0 - 52.0 %   MCV 92.9 80.0 - 100.0  fL   MCH 32.1 26.0 - 34.0 pg   MCHC 34.5 32.0 - 36.0 g/dL   RDW 13.2 11.5 - 14.5 %   Platelets 210 150 - 440 K/uL    Comment: Performed at [facility], [address] [place], [place] [address]  Glucose, capillary     Status: Abnormal   Collection Time: 11/29/17  8:00 AM  Result Value Ref Range   Glucose-Capillary 112 (H) 65 - 99 mg/dL    Dg Foot Complete Left  Result Date: 11/28/2017 CLINICAL DATA:  Diabetic foot ulcer EXAM: LEFT FOOT - COMPLETE 3+ VIEW COMPARISON:  None. FINDINGS: No fracture or dislocation is seen. The joint spaces are preserved. Mild cortical thickening/irregularity involving the base of the 5th metatarsal, chronic. No definite erosions. Overlying soft tissue swelling/ulceration. IMPRESSION: Soft tissue swelling/ulceration along the lateral base of the 5th metatarsal. No definite radiographic findings to suggest acute osteomyelitis. Electronically Signed   By: Julian Hy M.D.   On: 11/28/2017 21:56    Review of Systems  Constitutional: Negative for chills and fever.  HENT: Negative.   Eyes: Negative.   Respiratory: Negative.   Cardiovascular: Negative.   Gastrointestinal: Negative for nausea and vomiting.  Genitourinary: Negative.   Musculoskeletal:       Patient relates a significant increase in the pain in his left foot.  Skin:       Patient relates a chronic history of recurrent ulceration on his left foot managed by the wound care center.  More recent history of ulceration on the right foot for the last month or so.  Neurological:       Patient relates significant neuropathy related to his diabetes.  Endo/Heme/Allergies: Negative.   Psychiatric/Behavioral: Negative.    Blood pressure 136/78, pulse 84, temperature 98.3 F (36.8 C), resp. rate 18, height _0  (1.778 m), weight 111.9 kg (246 lb 11.2 oz), SpO2 99 %. Physical Exam  Cardiovascular:  DP pulse is thready and difficult to palpate on the left.  Trace on the right.  PT pulses  nonpalpable bilateral.  Musculoskeletal:  Previous amputation of the right hallux.  Some varus deformity noted in the left forefoot.  Muscle testing deferred today.  Significant pain is noted on any attempted palpation around the left fifth metatarsal base and midfoot region.  Neurological:  Loss of protective threshold with a monofilament wire in all digits with the exception of the left hallux.  Proprioception impaired on the right.  Skin:  The skin is thin dry and atrophic with some bilateral edema and hyperpigmentation.  Absent hair growth.  Full-thickness ulcerations are noted with some central necrotic tissue beneath the left fifth metatarsal base and right fifth metatarsal head.  Right fifth metatarsal probes down to the level of the joint the ulceration measuring approximately 1.2 cm diameter with a depth of 7 to 8 mm.  Some localized edema with possible abscess along the lateral aspect of the left midfoot with plantar ulceration measuring approximately 1 cm diameter and a depth of 5 to 6 mm.  No expressible purulence.    Assessment/Plan: Assessment: 1.  Full-thickness ulcerations bilateral fifth metatarsals with suspected abscess and osteomyelitis. 2.  Diabetes with associated neuropathy. 3.  Likely peripheral vascular disease.  Plan: Excisional debridement of devitalized and necrotic tissue from the ulcerations on both feet full-thickness including some of the deeper subcutaneous tissue.  This was accomplished sharply using a pair  of tissue nippers.  Gauze and Kerlix bandages applied to the feet.  Discussed with the patient that he will need debridement on both of these.  We will obtain MRI of both feet for evaluation of extent of abscess and osteomyelitis.  Also will need vascular consult with most likely intervention next week.  Pending results of MRI we will plan for surgery tomorrow  [name] 11/29/2017, 10:38 AM

## 2017-11-29 NOTE — H&P (View-Only) (Signed)
Reason for Consult: Ulcerations bilateral feet with infection. Referring Physician: Willis  Virgilio F Cottrill is an 57 y.o. male.  HPI: This is a 57-year-old male with a history of diabetes and associated neuropathy with chronic ulceration on his left foot on and off since 2016.  He has been managed by the wound care center.  Multiple previous admissions.  Recent development of ulceration on the right foot over the last month.  Worsening condition to the ulcerations and he was admitted for IV antibiotics and management.  Past Medical History:  Diagnosis Date  . Arthritis   . BPH (benign prostatic hypertrophy)   . Diabetes mellitus   . Diabetic neuropathy (HCC)   . Hypertension   . Metatarsal bone fracture    left 5th toe  . Osteomyelitis of toe of right foot (HCC)   . Post-operative infection    and diabetic ulcer left foot  . Sleep apnea     does not wear CPAP  . Wears glasses     Past Surgical History:  Procedure Laterality Date  . COLONOSCOPY W/ BIOPSIES AND POLYPECTOMY    . I&D EXTREMITY Left 01/11/2016   Procedure: LEFT FOOT IRRIGATION AND DEBRIDEMENT WOUND VAC AND REMOVAL OF HARDWARE;  Surgeon: John Hewitt, MD;  Location: [facility] OR;  Service: Orthopedics;  Laterality: Left;  . ORIF TOE FRACTURE Left 06/08/2015   Procedure: OPEN REDUCTION INTERNAL FIXATION (ORIF) LEFT FIFTH METATARSAL BASE FRACTURE NONUNION; CALCANEAL AUTOGRAFT ;  Surgeon: John Hewitt, MD;  Location: [facility];  Service: Orthopedics;  Laterality: Left;  . PATELLA RECONSTRUCTION Left 2005  . PROSTATE ABLATION  2014  . TOE AMPUTATION     partial amputation right great toe    Family History  Problem Relation Age of Onset  . Diabetes Mother   . Diabetes Other     Social History:  reports that he has never smoked. He has never used smokeless tobacco. He reports that he drinks alcohol. He reports that he does not use drugs.  Allergies:  Allergies  Allergen Reactions  . Bactrim  [Sulfamethoxazole-Trimethoprim] Hives    Medications:  Scheduled: . aspirin EC  81 mg Oral Daily  . enoxaparin (LOVENOX) injection  40 mg Subcutaneous Q24H  . insulin aspart  0-15 Units Subcutaneous TID AC & HS  . insulin glargine  35 Units Subcutaneous QHS  . losartan  50 mg Oral Daily  . simvastatin  20 mg Oral QPM    Results for orders placed or performed during the hospital encounter of 11/28/17 (from the past 48 hour(s))  Lactic acid, plasma     Status: None   Collection Time: 11/28/17  7:34 PM  Result Value Ref Range   Lactic Acid, Venous 0.9 0.5 - 1.9 mmol/L    Comment: Performed at [facility], [address] [place], [place] [address]  Comprehensive metabolic panel     Status: Abnormal   Collection Time: 11/28/17  7:34 PM  Result Value Ref Range   Sodium 134 (L) 135 - 145 mmol/L   Potassium 3.6 3.5 - 5.1 mmol/L   Chloride 97 (L) 101 - 111 mmol/L   CO2 27 22 - 32 mmol/L   Glucose, Bld 196 (H) 65 - 99 mg/dL   BUN 15 6 - 20 mg/dL   Creatinine, Ser 0.82 0.61 - 1.24 mg/dL   Calcium 8.9 8.9 - 10.3 mg/dL   Total Protein 8.3 (H) 6.5 - 8.1 g/dL   Albumin 3.4 (L) 3.5 - 5.0 g/dL     AST 17 15 - 41 U/L   ALT 15 (L) 17 - 63 U/L   Alkaline Phosphatase 105 38 - 126 U/L   Total Bilirubin 0.5 0.3 - 1.2 mg/dL   GFR calc non Af Amer >60 >60 mL/min   GFR calc Af Amer >60 >60 mL/min    Comment: (NOTE) The eGFR has been calculated using the CKD EPI equation. This calculation has not been validated in all clinical situations. eGFR's persistently <60 mL/min signify possible Chronic Kidney Disease.    Anion gap 10 5 - 15    Comment: Performed at [facility], [address] [place], [place] [address]  CBC with Differential     Status: Abnormal   Collection Time: 11/28/17  7:34 PM  Result Value Ref Range   WBC 10.3 3.8 - 10.6 K/uL   RBC 4.38 (L) 4.40 - 5.90 MIL/uL   Hemoglobin 14.0 13.0 - 18.0 g/dL   HCT 40.5 40.0 - 52.0 %   MCV 92.5 80.0 - 100.0 fL   MCH  32.0 26.0 - 34.0 pg   MCHC 34.5 32.0 - 36.0 g/dL   RDW 13.3 11.5 - 14.5 %   Platelets 242 150 - 440 K/uL   Neutrophils Relative % 70 %   Neutro Abs 7.3 (H) 1.4 - 6.5 K/uL   Lymphocytes Relative 16 %   Lymphs Abs 1.7 1.0 - 3.6 K/uL   Monocytes Relative 11 %   Monocytes Absolute 1.1 (H) 0.2 - 1.0 K/uL   Eosinophils Relative 2 %   Eosinophils Absolute 0.2 0 - 0.7 K/uL   Basophils Relative 1 %   Basophils Absolute 0.0 0 - 0.1 K/uL    Comment: Performed at [facility], [address] [place], [place] [address]  Sedimentation rate     Status: Abnormal   Collection Time: 11/28/17  7:34 PM  Result Value Ref Range   Sed Rate 74 (H) 0 - 20 mm/hr    Comment: Performed at [facility], [address] [place], [place] [address]  Glucose, capillary     Status: Abnormal   Collection Time: 11/29/17  2:14 AM  Result Value Ref Range   Glucose-Capillary 136 (H) 65 - 99 mg/dL  Basic metabolic panel     Status: Abnormal   Collection Time: 11/29/17  5:49 AM  Result Value Ref Range   Sodium 136 135 - 145 mmol/L   Potassium 3.4 (L) 3.5 - 5.1 mmol/L   Chloride 102 101 - 111 mmol/L   CO2 25 22 - 32 mmol/L   Glucose, Bld 109 (H) 65 - 99 mg/dL   BUN 10 6 - 20 mg/dL   Creatinine, Ser 0.64 0.61 - 1.24 mg/dL   Calcium 8.4 (L) 8.9 - 10.3 mg/dL   GFR calc non Af Amer >60 >60 mL/min   GFR calc Af Amer >60 >60 mL/min    Comment: (NOTE) The eGFR has been calculated using the CKD EPI equation. This calculation has not been validated in all clinical situations. eGFR's persistently <60 mL/min signify possible Chronic Kidney Disease.    Anion gap 9 5 - 15    Comment: Performed at [facility], [address] [place], [place] [address]  CBC     Status: Abnormal   Collection Time: 11/29/17  5:49 AM  Result Value Ref Range   WBC 7.5 3.8 - 10.6 K/uL   RBC 3.99 (L) 4.40 - 5.90 MIL/uL   Hemoglobin 12.8 (L) 13.0 - 18.0 g/dL     HCT 37.1 (L) 40.0 - 52.0 %   MCV 92.9 80.0 - 100.0  fL   MCH 32.1 26.0 - 34.0 pg   MCHC 34.5 32.0 - 36.0 g/dL   RDW 13.2 11.5 - 14.5 %   Platelets 210 150 - 440 K/uL    Comment: Performed at [facility], [address] [place], [place] [address]  Glucose, capillary     Status: Abnormal   Collection Time: 11/29/17  8:00 AM  Result Value Ref Range   Glucose-Capillary 112 (H) 65 - 99 mg/dL    Dg Foot Complete Left  Result Date: 11/28/2017 CLINICAL DATA:  Diabetic foot ulcer EXAM: LEFT FOOT - COMPLETE 3+ VIEW COMPARISON:  None. FINDINGS: No fracture or dislocation is seen. The joint spaces are preserved. Mild cortical thickening/irregularity involving the base of the 5th metatarsal, chronic. No definite erosions. Overlying soft tissue swelling/ulceration. IMPRESSION: Soft tissue swelling/ulceration along the lateral base of the 5th metatarsal. No definite radiographic findings to suggest acute osteomyelitis. Electronically Signed   By: Sriyesh  Krishnan M.D.   On: 11/28/2017 21:56    Review of Systems  Constitutional: Negative for chills and fever.  HENT: Negative.   Eyes: Negative.   Respiratory: Negative.   Cardiovascular: Negative.   Gastrointestinal: Negative for nausea and vomiting.  Genitourinary: Negative.   Musculoskeletal:       Patient relates a significant increase in the pain in his left foot.  Skin:       Patient relates a chronic history of recurrent ulceration on his left foot managed by the wound care center.  More recent history of ulceration on the right foot for the last month or so.  Neurological:       Patient relates significant neuropathy related to his diabetes.  Endo/Heme/Allergies: Negative.   Psychiatric/Behavioral: Negative.    Blood pressure 136/78, pulse 84, temperature 98.3 F (36.8 C), resp. rate 18, height 5' 10" (1.778 m), weight 111.9 kg (246 lb 11.2 oz), SpO2 99 %. Physical Exam  Cardiovascular:  DP pulse is thready and difficult to palpate on the left.  Trace on the right.  PT pulses  nonpalpable bilateral.  Musculoskeletal:  Previous amputation of the right hallux.  Some varus deformity noted in the left forefoot.  Muscle testing deferred today.  Significant pain is noted on any attempted palpation around the left fifth metatarsal base and midfoot region.  Neurological:  Loss of protective threshold with a monofilament wire in all digits with the exception of the left hallux.  Proprioception impaired on the right.  Skin:  The skin is thin dry and atrophic with some bilateral edema and hyperpigmentation.  Absent hair growth.  Full-thickness ulcerations are noted with some central necrotic tissue beneath the left fifth metatarsal base and right fifth metatarsal head.  Right fifth metatarsal probes down to the level of the joint the ulceration measuring approximately 1.2 cm diameter with a depth of 7 to 8 mm.  Some localized edema with possible abscess along the lateral aspect of the left midfoot with plantar ulceration measuring approximately 1 cm diameter and a depth of 5 to 6 mm.  No expressible purulence.    Assessment/Plan: Assessment: 1.  Full-thickness ulcerations bilateral fifth metatarsals with suspected abscess and osteomyelitis. 2.  Diabetes with associated neuropathy. 3.  Likely peripheral vascular disease.  Plan: Excisional debridement of devitalized and necrotic tissue from the ulcerations on both feet full-thickness including some of the deeper subcutaneous tissue.  This was accomplished sharply using a pair   of tissue nippers.  Gauze and Kerlix bandages applied to the feet.  Discussed with the patient that he will need debridement on both of these.  We will obtain MRI of both feet for evaluation of extent of abscess and osteomyelitis.  Also will need vascular consult with most likely intervention next week.  Pending results of MRI we will plan for surgery tomorrow  Marwah Disbro W Keelee Yankey 11/29/2017, 10:38 AM     

## 2017-11-30 ENCOUNTER — Inpatient Hospital Stay: Payer: BLUE CROSS/BLUE SHIELD | Admitting: Anesthesiology

## 2017-11-30 ENCOUNTER — Encounter
Admission: EM | Disposition: A | Discharge status: Skilled Nursing Facility | Payer: Self-pay | Source: Ambulatory Visit | Attending: Internal Medicine

## 2017-11-30 ENCOUNTER — Encounter: Payer: Self-pay | Admitting: Anesthesiology

## 2017-11-30 HISTORY — PX: WOUND DEBRIDEMENT: SHX247

## 2017-11-30 LAB — GLUCOSE, CAPILLARY
GLUCOSE-CAPILLARY: 128 mg/dL — AB (ref 65–99)
GLUCOSE-CAPILLARY: 150 mg/dL — AB (ref 65–99)
GLUCOSE-CAPILLARY: 251 mg/dL — AB (ref 65–99)
Glucose-Capillary: 252 mg/dL — ABNORMAL HIGH (ref 65–99)

## 2017-11-30 LAB — CREATININE, SERUM: Creatinine, Ser: 0.69 mg/dL (ref 0.61–1.24)

## 2017-11-30 LAB — VANCOMYCIN, TROUGH: Vancomycin Tr: 13 ug/mL — ABNORMAL LOW (ref 15–20)

## 2017-11-30 SURGERY — DEBRIDEMENT, WOUND
Anesthesia: General | Laterality: Bilateral

## 2017-11-30 MED ORDER — FENTANYL CITRATE (PF) 100 MCG/2ML IJ SOLN
INTRAMUSCULAR | Status: DC | PRN
  Administered 2017-11-30 (×4): 50 ug via INTRAVENOUS

## 2017-11-30 MED ORDER — SODIUM CHLORIDE 0.9 % IV SOLN
INTRAVENOUS | Status: AC
  Administered 2017-11-30 (×2): via INTRAVENOUS

## 2017-11-30 MED ORDER — PHENYLEPHRINE HCL 10 MG/ML IJ SOLN
INTRAMUSCULAR | Status: DC | PRN
  Administered 2017-11-30 (×2): 200 ug via INTRAVENOUS
  Administered 2017-11-30: 100 ug via INTRAVENOUS
  Administered 2017-11-30 (×2): 200 ug via INTRAVENOUS

## 2017-11-30 MED ORDER — MORPHINE SULFATE (PF) 2 MG/ML IV SOLN: 2.0000 mg | INTRAVENOUS | Status: DC | PRN

## 2017-11-30 MED ORDER — KETAMINE HCL 50 MG/ML IJ SOLN
INTRAMUSCULAR | Status: AC
  Filled 2017-11-30: qty 10

## 2017-11-30 MED ORDER — EPHEDRINE SULFATE 50 MG/ML IJ SOLN
INTRAMUSCULAR | Status: AC
  Filled 2017-11-30: qty 1

## 2017-11-30 MED ORDER — BUPIVACAINE HCL (PF) 0.5 % IJ SOLN
INTRAMUSCULAR | Status: AC
  Filled 2017-11-30: qty 30

## 2017-11-30 MED ORDER — GLYCOPYRROLATE 0.2 MG/ML IJ SOLN
INTRAMUSCULAR | Status: AC
  Filled 2017-11-30: qty 1

## 2017-11-30 MED ORDER — FENTANYL CITRATE (PF) 100 MCG/2ML IJ SOLN
INTRAMUSCULAR | Status: AC
  Filled 2017-11-30: qty 2

## 2017-11-30 MED ORDER — LIDOCAINE HCL (PF) 2 % IJ SOLN
INTRAMUSCULAR | Status: DC | PRN
  Administered 2017-11-30: 50 mg

## 2017-11-30 MED ORDER — OXYCODONE-ACETAMINOPHEN 5-325 MG PO TABS
1.0000 | ORAL_TABLET | ORAL | Status: DC | PRN
  Administered 2017-11-30: 2 via ORAL
  Administered 2017-11-30: 1 via ORAL
  Administered 2017-12-01 – 2017-12-04 (×7): 2 via ORAL
  Filled 2017-11-30 (×4): qty 2
  Filled 2017-11-30: qty 1
  Filled 2017-11-30 (×4): qty 2

## 2017-11-30 MED ORDER — VANCOMYCIN HCL 1000 MG IV SOLR
INTRAVENOUS | Status: AC
  Filled 2017-11-30: qty 1000

## 2017-11-30 MED ORDER — BUPIVACAINE HCL (PF) 0.5 % IJ SOLN
INTRAMUSCULAR | Status: DC | PRN
  Administered 2017-11-30: 19 mL

## 2017-11-30 MED ORDER — MIDAZOLAM HCL 5 MG/5ML IJ SOLN
INTRAMUSCULAR | Status: DC | PRN
  Administered 2017-11-30: 2 mg via INTRAVENOUS

## 2017-11-30 MED ORDER — FENTANYL CITRATE (PF) 100 MCG/2ML IJ SOLN
25.0000 ug | INTRAMUSCULAR | Status: DC | PRN
  Administered 2017-11-30 (×2): 50 ug via INTRAVENOUS

## 2017-11-30 MED ORDER — ONDANSETRON HCL 4 MG/2ML IJ SOLN
INTRAMUSCULAR | Status: DC | PRN
  Administered 2017-11-30: 4 mg via INTRAVENOUS

## 2017-11-30 MED ORDER — KETAMINE HCL 50 MG/ML IJ SOLN
INTRAMUSCULAR | Status: DC | PRN
  Administered 2017-11-30: 50 mg via INTRAMUSCULAR

## 2017-11-30 MED ORDER — DEXAMETHASONE SODIUM PHOSPHATE 10 MG/ML IJ SOLN
INTRAMUSCULAR | Status: DC | PRN
  Administered 2017-11-30: 5 mg via INTRAVENOUS

## 2017-11-30 MED ORDER — GLYCOPYRROLATE 0.2 MG/ML IJ SOLN
INTRAMUSCULAR | Status: DC | PRN
  Administered 2017-11-30: 0.2 mg via INTRAVENOUS

## 2017-11-30 MED ORDER — MIDAZOLAM HCL 2 MG/2ML IJ SOLN
INTRAMUSCULAR | Status: AC
  Filled 2017-11-30: qty 2

## 2017-11-30 MED ORDER — ONDANSETRON HCL 4 MG/2ML IJ SOLN
INTRAMUSCULAR | Status: AC
  Filled 2017-11-30: qty 2

## 2017-11-30 MED ORDER — PROPOFOL 10 MG/ML IV BOLUS
INTRAVENOUS | Status: DC | PRN
  Administered 2017-11-30: 200 mg via INTRAVENOUS

## 2017-11-30 MED ORDER — EPHEDRINE SULFATE 50 MG/ML IJ SOLN
INTRAMUSCULAR | Status: DC | PRN
  Administered 2017-11-30 (×3): 10 mg via INTRAVENOUS

## 2017-11-30 MED ORDER — OXYCODONE HCL 5 MG PO TABS: 5.0000 mg | ORAL_TABLET | Freq: Once | ORAL | Status: DC | PRN

## 2017-11-30 MED ORDER — LIDOCAINE HCL (PF) 2 % IJ SOLN
INTRAMUSCULAR | Status: AC
  Filled 2017-11-30: qty 10

## 2017-11-30 MED ORDER — OXYCODONE HCL 5 MG/5ML PO SOLN: 5.0000 mg | Freq: Once | ORAL | Status: DC | PRN

## 2017-11-30 MED ORDER — PROPOFOL 10 MG/ML IV BOLUS
INTRAVENOUS | Status: AC
  Filled 2017-11-30: qty 20

## 2017-11-30 SURGICAL SUPPLY — 55 items
"PENCIL ELECTRO HAND CTR " (MISCELLANEOUS) ×1 IMPLANT
BANDAGE ELASTIC 4 LF NS (GAUZE/BANDAGES/DRESSINGS) ×2 IMPLANT
BLADE OSCILLATING/SAGITTAL (BLADE)
BLADE SURG 15 STRL LF DISP TIS (BLADE) ×1 IMPLANT
BLADE SURG 15 STRL SS (BLADE) ×1
BLADE SW THK.38XMED LNG THN (BLADE) IMPLANT
BNDG ESMARK 4X12 TAN STRL LF (GAUZE/BANDAGES/DRESSINGS) ×3 IMPLANT
BNDG GAUZE 4.5X4.1 6PLY STRL (MISCELLANEOUS) ×3 IMPLANT
CANISTER SUCT 1200ML W/VALVE (MISCELLANEOUS) ×1 IMPLANT
CANISTER SUCT 3000ML PPV (MISCELLANEOUS) ×2 IMPLANT
CNTNR SPEC 2.5X3XGRAD LEK (MISCELLANEOUS) ×2
CONT SPEC 4OZ STER OR WHT (MISCELLANEOUS) ×2
CONTAINER SPEC 2.5X3XGRAD LEK (MISCELLANEOUS) IMPLANT
CUFF TOURN 18 STER (MISCELLANEOUS) ×3 IMPLANT
CUFF TOURN DUAL PL 12 NO SLV (MISCELLANEOUS) ×1 IMPLANT
DRAPE FLUOR MINI C-ARM 54X84 (DRAPES) IMPLANT
DRSG MEPITEL 4X7.2 (GAUZE/BANDAGES/DRESSINGS) ×1 IMPLANT
DURAPREP 26ML APPLICATOR (WOUND CARE) ×3 IMPLANT
ELECT REM PT RETURN 9FT ADLT (ELECTROSURGICAL) ×2
ELECTRODE REM PT RTRN 9FT ADLT (ELECTROSURGICAL) ×1 IMPLANT
GAUZE PETRO XEROFOAM 1X8 (MISCELLANEOUS) ×3 IMPLANT
GAUZE SPONGE 4X4 12PLY STRL (GAUZE/BANDAGES/DRESSINGS) ×3 IMPLANT
GAUZE STRETCH 2X75IN STRL (MISCELLANEOUS) ×2 IMPLANT
GLOVE BIO SURGEON STRL SZ7.5 (GLOVE) ×5 IMPLANT
GLOVE INDICATOR 8.0 STRL GRN (GLOVE) ×2 IMPLANT
GOWN STRL REUS W/ TWL LRG LVL3 (GOWN DISPOSABLE) ×2 IMPLANT
GOWN STRL REUS W/TWL LRG LVL3 (GOWN DISPOSABLE) ×2
HANDPIECE VERSAJET DEBRIDEMENT (MISCELLANEOUS) ×2 IMPLANT
KIT STIMULAN RAPID CURE 5CC (Orthopedic Implant) ×1 IMPLANT
LABEL OR SOLS (LABEL) ×2 IMPLANT
NDL FILTER BLUNT 18X1 1/2 (NEEDLE) ×1 IMPLANT
NDL HYPO 25X1 1.5 SAFETY (NEEDLE) ×3 IMPLANT
NEEDLE FILTER BLUNT 18X 1/2SAF (NEEDLE) ×1
NEEDLE FILTER BLUNT 18X1 1/2 (NEEDLE) ×1 IMPLANT
NEEDLE HYPO 25X1 1.5 SAFETY (NEEDLE) ×6 IMPLANT
NS IRRIG 500ML POUR BTL (IV SOLUTION) ×2 IMPLANT
PACK EXTREMITY ARMC (MISCELLANEOUS) ×2 IMPLANT
PAD ABD DERMACEA PRESS 5X9 (GAUZE/BANDAGES/DRESSINGS) ×4 IMPLANT
PENCIL ELECTRO HAND CTR (MISCELLANEOUS) ×2 IMPLANT
RASP SM TEAR CROSS CUT (RASP) IMPLANT
SOL PREP PVP 2OZ (MISCELLANEOUS) ×2
SOLUTION PREP PVP 2OZ (MISCELLANEOUS) ×1 IMPLANT
SPONGE LAP 18X18 5 PK (GAUZE/BANDAGES/DRESSINGS) ×2 IMPLANT
STAPLER SKIN PROX 35W (STAPLE) ×1 IMPLANT
STOCKINETTE 48X4 2 PLY STRL (GAUZE/BANDAGES/DRESSINGS) ×1 IMPLANT
STOCKINETTE STRL 4IN 9604848 (GAUZE/BANDAGES/DRESSINGS) ×4 IMPLANT
STOCKINETTE STRL 6IN 960660 (GAUZE/BANDAGES/DRESSINGS) ×1 IMPLANT
SUT ETHILON 4-0 (SUTURE) ×1
SUT ETHILON 4-0 FS2 18XMFL BLK (SUTURE) ×1
SUT VIC AB 3-0 SH 27 (SUTURE) ×1
SUT VIC AB 3-0 SH 27X BRD (SUTURE) ×1 IMPLANT
SUT VIC AB 4-0 FS2 27 (SUTURE) ×2 IMPLANT
SUTURE ETHLN 4-0 FS2 18XMF BLK (SUTURE) ×1 IMPLANT
SYR 10ML LL (SYRINGE) ×4 IMPLANT
SYR 3ML LL SCALE MARK (SYRINGE) ×2 IMPLANT

## 2017-11-30 NOTE — Progress Notes (Signed)
15 minute call to floor. 

## 2017-11-30 NOTE — Progress Notes (Signed)
ZYHIR, CAPPELLA ([id number]) Visit Report for 11/28/2017 Chief Complaint Document Details Patient Name: [name], [name]. Date of Service: 11/28/2017 3:30 PM Medical Record Number: [id number] Patient Account Number: [id number] Date of Birth/Sex: 11-20-1960 (57 y.o. M) Treating RN: Montey Hora Primary Care Provider: Lorelee Market Other Clinician: Referring Provider: Lorelee Market Treating Provider/Extender: Melburn Hake, Milaya Hora Weeks in Treatment: 3 Information Obtained from: Patient Chief Complaint Bilateral foot ulcers Electronic Signature(s) Signed: 11/28/2017 4:49:33 PM By: Worthy Keeler PA-C Entered By: Worthy Keeler on 11/28/2017 16:05:42 [name] ([id number]) -------------------------------------------------------------------------------- HPI Details Patient Name: [name]. Date of Service: 11/28/2017 3:30 PM Medical Record Number: [id number] Patient Account Number: [id number] Date of Birth/Sex: [birth year] (57 y.o. M) Treating RN: Montey Hora Primary Care Provider: Lorelee Market Other Clinician: Referring Provider: Lorelee Market Treating Provider/Extender: Melburn Hake, Skylah Delauter Weeks in Treatment: 3 History of Present Illness HPI Description: Pleasant 57 year old with history of diabetes (Hgb A1c 10.8 in 2014) and peripheral neuropathy. No PVD. L ABI 1.1. Status post right great toe partial amputation years ago. He was at work and on 10/22/2014, was injured by a cart, and suffered an ulceration to his left anterior calf. He says that it subsequently became infected, and he was treated with a course of antibiotics. He was found on initial exam to have an ulceration on the dorsum of his left third toe. He was unaware of this and attributes it to pressure from his steel toed boots. More recently he injured his right anterior calf on a cart. Ambulating normally per his baseline. He has been undergoing regular debridements, applying mupirocin cream,  and an Ace wrap for edema control. He returns to clinic for follow-up and is without complaints. No pain. No fever or chills. No drainage. 10/25/15; this is a 57 year old man who has type II diabetes with diabetic polyneuropathy. He tells me that he fractured his left fifth metatarsal in June 2016 when he presented with swelling. He does not recall a specific injury. His hemoglobin A1c was apparently too high at the time for consideration of surgery and he was put in some form of offloading. Ultimately he went to surgery in December with an allograft from his calcaneus to this site, plate and screws. He had an x-ray of the foot in March that showed concerns about nonunion. He tells me that in March he had to move and basically moved himself. He was on his foot a lot and then noticed some drainage from an open area. He has been following with his orthopedic surgeon Dr. Doran Durand. He has been applying a felt donut, dry dressing and using his heel healing sandal. 11/01/15; this is a patient I saw last week for the first time. He had a small open wound on the plantar aspect of his left foot at roughly the level of the base of his fifth metatarsal. He had a considerable degree of thickened skin around this wound on the plantar aspect which I thought was from chronic pressure on this area. He tells [place] that he had drainage over the course of the week. No systemic symptoms. 11/08/15; culture last week grew Citrobacter korseri. This should've been sensitive to the Augmentin I gave him. He has seen Dr. Doran Durand who did his initial surgery and according the patient the plan is to give this another month and then the hardware might need to come out of this. This seems like a reasonable plan. I will adjust his antibiotics to ciprofloxacin which probably should continue for at  least another 2 weeks. I gave him 10 days worth today 11/22/15 the patient has completed antibiotics. He has an appointment with Dr. Doran Durand this  Friday. There is improved dimensions around the wound on the left fifth metatarsal base 11/29/15; the patient has completed antibiotics last week. Apparently his appointment with Dr. Doran Durand it is not until this Friday. Dimensions are roughly the same. 12/06/15; saw Dr. Doran Durand. No x-ray told the end of the month, next appointment June 30. We have been using Aquacel Ag 12/13/15: No major change this week. Using Aquacel AG 621/17; arrived this week with maceration around the wound. There was quite a bit of undermining which required surgical debridement. I changed him to [facility] last week, by the patient's admission he was up on this more this week 12/27/15; macerated tissue around the wound is removed with a scalpel and pickups. There is no undermining. Nonviable subcutaneous tissue and skin taken from the superior circumference of the wound is slough from the surface. READMISSION 03/06/16 since I last saw this patient at the end of June, he went for surgery on 01/11/16 by Dr. Doran Durand of orthopedics. He had a left foot irrigation and debridement, removal of hardware and placement of wound VAC. He is also been followed by Dr. Megan Salon of infectious disease and completed a six-week course of IV Rocephin for group B strep and Enterobacter in the bone at the time of surgery. Apparently at the time of surgery the bone looked healthy so I don't think any bone was actually removed. He has been using silver alginate based dressings on the same wound area at the base of the left fifth metatarsal on the left. I note that he is also had arterial studies on 01/08/16, these showed a left ABI of 1.2 to and a right ABI of 1.3. Waveforms were listed as biphasic. He was not felt to have any specific arterial issues. 03/13/16; no real change in the condition of the wound at the left lateral foot at roughly the base of his fifth metatarsal. Use silver alginate last week. DAXSON, REFFETT ([id number]) 03/18/16 arrives  today with no open area. Being suspicious of the overlying callus I. Some of this back although I see nothing but covering tissue here/epithelium. There is no surrounding tenderness READMISSION 04/16/16 this is a patient I discharged about a month ago. Initially a surgical wound on the lateral aspect of his left foot which subsequently became infected. The story he is giving today that he went back into his own modified shoe started to notice pain 2 weeks ago he was seen in Dr. Nona Dell office by a physician assistant last Wednesday and according the patient was told that everything looked fine however this is clearly now broken open and he has an open wound in the same spot that we have been dealing with repetitively. Situation is complicated by the fact that he is running short of money on long-term disability. He has not taken his insulin and at least 2 weeks was previously on NovoLog short acting insulin on a sliding scale and TRESIBA 35 units at bedtime. He is no longer able to afford any of his medication he was in the x-ray on 10/15. A plain x-ray showed healed fracture of the left fifth metatarsal bone status post removal of the associated plate and screw fixation hardware. There was no acute appearing osseous abnormality. His blood work showed a white count of 9.2 with an essentially normal differential comprehensive metabolic panel was normal. Previous  CT scan of the foot on 01/08/16 showed no osteomyelitis previous vascular workup showed no evidence of significant PAD on 01/13/16 04/23/16; culture I did last week grew enterococcus [ampicillin sensitive] and MRSA. He saw infectious disease yesterday. They stop the clindamycin and ordered an MRI. This is not unreasonable. All the hardware is out of the foot at this point. 04/30/16 at this point in time we are still awaiting the results of the MRI at this point in time. Patient did have an area which appears to be somewhat macerated in the  proximal portion of the wound where there is overlying necrotic skin that is doing nothing more than trapping fluid underneath. He continues to state that he does have some discomfort and again the concern is for the possibility of osteomyelitis hence the reason for the MRI order. We have been using a silver alginate dressing but again I think the reason this with his macerated as it was is that the dressing obscene could not reach the entirety of the wound due to some necrotic skin covering the proximal portion. No bleeding noted at this point in time on initial evaluation. 05/07/16 we receive the results of patient's MRI which shows that he has no evidence of osteomyelitis. This obviously is excellent news. Patient was definitely happy to hear this. He tells me the wound appears to be doing very well at this point in time and is pleased with the progress currently. 05/15/16; as noted the patient did not have osteomyelitis. He has been released by infectious disease and orthopedics. His wound is still open he had a debridement last week but complain when he got home he "bleeding for half a day". He is not had any pain. We have been using silver alginate with Kerlix gauze wrap. 05/28/16; patient arrives today with the wound in much the same condition as last time. He has a small opening on the lateral aspect of his foot however with debridement there is clear undermining medially there is no real evidence of infection here and I didn't see any point in culturing this. One would have to wonder if this isn't a simple matter of in adequate offloading as he is been using a healing sandal. 06/05/16; the open area is now on the plantar aspect of his foot and not decide. The wound almost appears to have "migrated". This was the term use by our intake nurse. 06/12/16; open area on the plantar aspect of his foot. Base of the wound looks very healthy. This will be his second week in a total contact  cast 06/19/16; patient arrived today in a total contact cast. There was some expectation from our staff and myself that this area would be healed. Unfortunately the area was boggy and with rec pressure a fairly substantial amount of purulent drainage was obtained. Specimen obtained for culture. The patient had no complaints of systemic problems including fever or chills or instability of his diabetes. There was no pain in the foot. Nevertheless a extensive debridement was required. 06/26/16; patient's culture from the abscess last week grew a combination of MRSA and ampicillin sensitive enterococcus. I had him on Augmentin and Septra however I have elected to give him a full 10 day course of Zyvox instead as I Recently treated this combination of organisms with Augmentin and Septra before. Arrives today with no systemic symptoms 07/03/16; the patient has 2 more treatments of Zyvox and then he is finished antibiotics the wound has improved now mostly on the lateral aspect  of his foot. There is still some tenderness when he walks. 07/10/16; patient arrives today with Zyvox completed. He only has a small open area remaining. 07/24/16; she arrives here with no overt open area. The covering is thick callus/eschar. Nevertheless there is no open area here. He has some tenderness underneath the area but no overt infection is observed no drainage. The patient has a deformity in the foot with this area weekly to be exposed to more pressure in his foot where nevertheless that something we are going to have to deal with going forward. The patient has diabetic workboots and diabetic shoes. He has had a long difficult course with the area here. This started as a fracture at work. He had bone grafting from his calcaneus and screws. This got infected he had to have more surgery on the area. Bone at the time of that surgery I think showed enterococcus and group B strep. He had 6 weeks of Rocephin. Since then the area  has waxed and waned in its difficulty. Recently he had an MRI in December that did not show osteomyelitis nevertheless he had an abscess that grew MRSA and enterococcus which I elected to treat with Zyvox. This was in December and this wound is actually "healed" over SRINIVAS, LIPPMAN ([id number]) 08/21/16; the patient came in today for his one-month follow-up visit. The area on the lateral aspect of his left foot looks much the same as some month ago. There is no evidence of an open wound here. However the patient tells me about a week after he went back to work he developed severe pain and swelling in the plantar aspect of his right foot first and fifth metatarsal heads. 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 He went to see his podiatrist at [facility]. She told him that he could not work on his feet. She told him to go back and his cam walking boot on the left. Not have open wounds obviously on the right. The patient is actually gone ahead and retired from his job because he does not feel he can work on his feet 09/18/16; patient comes back in saying he recently had some pain on the lateral aspect of his left foot. Asked that we look at this. Other than that all of his wounds have a viable surface. He has diabetic shoes. He is retired from work. READMISSION Lavell Supple is a 57 year old man who we cared for for a prolonged period of time in late 2017 into March 2018. At that point he had suffered a fracture of his left fifth metatarsal at about the level of the base of the fifth metatarsal. He had surgical repair by Dr. Meryl Dare and he developed a very refractory wound over the fifth metatarsal. Ultimately this became infected he required hardware removal this eventually closed over and we discharged from the clinic in March of this year. He tells me as well until roughly 3 weeks ago when he developed  increasing pain in the same area making it difficult for him to sleep. He was admitted to hospital from 9/5 through 9/7 with now an ulcer in the same area on the lateral aspect of his left foot at the level of the base of the metatarsal. X-rays and MRIs during this hospitalization were negative. Wound culture showed a combination of Escherichia coli, Morganella and Pseudomonas. He was given IV Cipro and vancomycin the  hospital and then discharged home on Cipro and Doxy. He returned to the ER on 9/16 with leg swelling and discomfort. I don't think anything was added at that point although he did have an ultrasound of the left leg that was negative for DVT. He was back in the ER on 10/4 again with pain in the area he was given Bactrim but states he had a significant allergic reaction to that which has abated once he stopped the Bactrim. I don't think he had a full course. He went to see his podiatrist yesterday who recommended some form of foot soak. He has a Darco forefoot offloading boot The patient is a type II diabetic on insulin. Tells me his recent hemoglobin A1c was 7. Arterial studies done in July 2017 showed an ABI in the right of 1.3 on the left 1.2 to biphasic waveforms bilaterally. He was not felt to have arterial disease. As noted the patient has had 2 MRIs most recently on 10/4 this showed no evidence of an abscess or osteomyelitis and no change since the prior study of 03/05/17 there was nonspecific edema on the dorsum of the foot. ABIs in this clinic today 1.2 which would be unchanged from his study done in July/17 04/15/17; now down to 1 small wound on the left lateral foot. We wrapped him and applied collagen last week and the foot is fairly macerated. 04/22/17; small wound on the left lateral foot however it has some undermining. Using collagen 04/29/17; small wound on the left lateral foot some surrounding callus. Chronic damage in this area as a result of initial fracture  nonhealing and secondary infection. 05/06/17; the patient arrives today with the area on the left lateral foot closed. There is thick subcutaneous tissue with a layer of callus. I took some of the callus off just to ensure adequate closure of the underlying tissue and there is no open wound here. He has considerable deformity of this area of his foot however and unless there is an ability to offload this I think opening is going to be recurrent. He tells me he has diabetic foot wear although I have not actually seen this Readmission: 09/02/17 on evaluation today patient appears to be doing a little better compared to what he tells me what's going on in regard to his left lateral foot. He was previously seen in the ER this past Saturday it is now Tuesday. On Saturday he actually was treated with antibiotics including Cipro 500 mg two times a day and doxycycline 100 mg two times a day. Subsequently he also did have an x-ray performed of his left foot which revealed increased degenerative changes of the base of the fifth metatarsal since comparison in 2018. There was no radiographic evidence of osteomyelitis however. Patient has previously had an MRI of the left foot which was performed on 04/03/17 in this revealed at that point a soft tissue ulceration but no evidence of osteomyelitis. Patient has previously undergone testing in regard to his ABI's by Dr. Bridgett Larsson and this showed that he did have ABI was within normal limits which does correspond with ABI's we checked here in the office today. That study was on 01/13/16. With all that being said on evaluation today there does not appear to be any evidence of a true open wound of the left lateral fifth metatarsal region. He does have tenderness noted as well is a little bit of boggy/fluctuance feeling to the area in this region of his foot and there is  definitely a very solid and firm callous noted laterally. With that being said I am not able to find any  obvious opening at this point there does appear to be a spot where there appears to been a opening very recently however patient states he has not noted any drainage from this currently. He does however have discomfort with palpation of this area this is in the 3-4/10 range only with very firm palpation this is not too significant at all. Subsequently he does have a small ulcer at the medial nail bed of the right second toe where he inadvertently pulled off a portion of the toenail softly causing this injury. Otherwise there does not appear to be any evidence of infection in regard to this right second toe. JAMI, OHLIN ([id number]) 09/23/17 on evaluation today patient actually appears to be doing well he has no open ulcerations noted at this point. With that being said he did have significant callous noted over the left lateral foot which did require callous pairing today he tolerated this without complication. Readmission: 11/07/17 patient presents today for readmission concerning to ulcers that he has. One on the right plantar fifth metatarsal region and the other on the left plantar fifth metatarsal region. He has previously had an x-ray which was performed on 11/05/17 and revealed that the patient had wounds noted of the base of the fifth metatarsal region without evidence of destruction to the bone to suggest osteomyelitis. Obviously this is excellent news. With that being said he does have a significantly large ulcer that extensively plantar surface of the wound and is concerning for the fact that he continues to walk on this due to the patient telling me that he "has to work" I do believe this has likely led to both the opening of the wound as well as the fact that is not really able to heal very well at this point. He has been seen by his podiatrist and apparently according to the note dated 10/23/17 the patient was provided with Santyl and recommended a dry dressing. No wound culture has  been obtained at this point. As stated above he has had returned to work due to financial reasons and is not able to wear his postop shoes due to the fact that he cannot wear this and work. The wound is stated to have been present for "several weeks" prior to the visit with the podiatrist on 10/23/17. With that being said again at least the good news is there does not appear to be any evidence of osteomyelitis. 11/14/17 on evaluation today patient appears to be doing poorly in regard to his bilateral foot ulcers. Unfortunately he seems to have significant callous with a lot of maceration underlined especially on the right even more so than the left. He has been tolerating the dressing changes without complication. With that being said overall I do think he's gonna have to have a significant debridement today. 11/21/17 on evaluation today patient presents for follow-up concerning his bilateral lateral foot ulcers. He has been tolerating the dressing changes without complication. His wife helps perform these for him. With that being said the patient states that the left lateral foot ulcer actually is continuing to give him trouble as far as discomfort is concerned. With that being said actually appears better. 11/28/17 on evaluation today patient appears to be doing poorly in regard to his left lower extremity ulcer in particular. His right as made some progress on the lateral portion although the plantar portion  is actually getting somewhat worse I think this is likely due to the fact that he continues to work although it hurts and he's been having a lot of issues out of this side and as far as pain is concerned. With that being said my biggest concern on evaluation today is that of the patient having issues with increased pain in the left lower extremity ulcer site especially on the plantar aspect. He also appears to have what seems to be an abscess developing in the dorsal/lateral portion of his foot  which I think likely is communicating somewhere internally with the ulcer site. Nonetheless he has not had any fevers, chills, nausea, or vomiting noted at this time. He has however had fairly significant increase in pain and he was Artie having a lot of pain previous. Electronic Signature(s) Signed: 11/28/2017 4:49:33 PM By: Worthy Keeler PA-C Entered By: Worthy Keeler on 11/28/2017 16:44:52 [name] ([id number]) -------------------------------------------------------------------------------- Physical Exam Details Patient Name: [name], [name]. Date of Service: 11/28/2017 3:30 PM Medical Record Number: [id number] Patient Account Number: [id number] Date of Birth/Sex: 08-02-1960 (57 y.o. M) Treating RN: Montey Hora Primary Care Provider: Lorelee Market Other Clinician: Referring Provider: Lorelee Market Treating Provider/Extender: Melburn Hake, Ceanna Wareing Weeks in Treatment: 3 Constitutional Well-nourished and well-hydrated in no acute distress. Respiratory normal breathing without difficulty. clear to auscultation bilaterally. Cardiovascular regular rate and rhythm with normal S1, S2. no clubbing, cyanosis, significant edema, <3 sec cap refill. Psychiatric this patient is able to make decisions and demonstrates good insight into disease process. Alert and Oriented x 3. pleasant and cooperative. Notes At this point on inspection both of the patient's wounds on the plantar aspect appear to be doing more poorly laterally both appear to be doing fairly well. He does have what appears to be an abscess beginning to develop over the lateral/dorsal portion of his left foot. With that being said this is what has me most concern at this point. No debridement performed today. Electronic Signature(s) Signed: 11/28/2017 4:49:33 PM By: Worthy Keeler PA-C Entered By: Worthy Keeler on 11/28/2017 16:46:19 [name]  ([id number]) -------------------------------------------------------------------------------- Physician Orders Details Patient Name: CHANG, TIGGS. Date of Service: 11/28/2017 3:30 PM Medical Record Number: [id number] Patient Account Number: [id number] Date of Birth/Sex: April 24, 1961 (57 y.o. M) Treating RN: Montey Hora Primary Care Provider: Lorelee Market Other Clinician: Referring Provider: Lorelee Market Treating Provider/Extender: Melburn Hake, Lianne Carreto Weeks in Treatment: 3 Verbal / Phone Orders: No Diagnosis Coding ICD-10 Coding Code Description E11.621 Type 2 diabetes mellitus with foot ulcer L97.522 Non-pressure chronic ulcer of other part of left foot with fat layer exposed L97.512 Non-pressure chronic ulcer of other part of right foot with fat layer exposed Wound Cleansing Wound #12 Left,Lateral Foot o May Shower, gently pat wound dry prior to applying new dressing. Wound #13 Right,Plantar Metatarsal head first o May Shower, gently pat wound dry prior to applying new dressing. Anesthetic (add to Medication List) Wound #12 Left,Lateral Foot o Topical Lidocaine 4% cream applied to wound bed prior to debridement (In Clinic Only). Wound #13 Right,Plantar Metatarsal head first o Topical Lidocaine 4% cream applied to wound bed prior to debridement (In Clinic Only). Primary Wound Dressing Wound #12 Left,Lateral Foot o Silver Alginate Wound #13 Right,Plantar Metatarsal head first o Silver Alginate Secondary Dressing Wound #12 Left,Lateral Foot o Gauze, ABD and Kerlix/Conform - offload with foam or felt Wound #13 Right,Plantar Metatarsal head first o Gauze, ABD and Kerlix/Conform - offload with foam or felt  Dressing Change Frequency Wound #12 Left,Lateral Foot o Change dressing every day. Wound #13 Right,Plantar Metatarsal head first o Change dressing every day. Follow-up Appointments [facility], [facility] ([id number]) Wound #12 Left,Lateral Foot o  Return Appointment in 1 week. Wound #13 Right,Plantar Metatarsal head first o Return Appointment in 1 week. Additional Orders / Instructions Wound #12 Left,Lateral Foot o Increase protein intake. Wound #13 Right,Plantar Metatarsal head first o Increase protein intake. Notes Patient advised to go to [facility] ED r/t possible abscess of left foot Electronic Signature(s) Signed: 11/28/2017 4:49:33 PM By: Worthy Keeler PA-C Signed: 11/28/2017 5:45:37 PM By: Montey Hora Entered By: Montey Hora on 11/28/2017 16:43:20 [name] ([id number]) -------------------------------------------------------------------------------- Problem List Details Patient Name: [name], [name]. Date of Service: 11/28/2017 3:30 PM Medical Record Number: [id number] Patient Account Number: [id number] Date of Birth/Sex: 1960-12-22 (57 y.o. M) Treating RN: Montey Hora Primary Care Provider: Lorelee Market Other Clinician: Referring Provider: Lorelee Market Treating Provider/Extender: Melburn Hake, Fani Rotondo Weeks in Treatment: 3 Active Problems ICD-10 Impacting Encounter Code Description Active Date Wound Healing Diagnosis E11.621 Type 2 diabetes mellitus with foot ulcer 11/07/2017 No Yes L97.522 Non-pressure chronic ulcer of other part of left foot with fat 11/07/2017 No Yes layer exposed L97.512 Non-pressure chronic ulcer of other part of right foot with fat 11/07/2017 No Yes layer exposed Inactive Problems Resolved Problems Electronic Signature(s) Signed: 11/28/2017 4:49:33 PM By: Worthy Keeler PA-C Entered By: Worthy Keeler on 11/28/2017 16:05:34 [name] ([id number]) -------------------------------------------------------------------------------- Progress Note/History and Physical Details Patient Name: [name]. Date of Service: 11/28/2017 3:30 PM Medical Record Number: [id number] Patient Account Number: [id number] Date of Birth/Sex: 05-Jun-1961 (57 y.o. M) Treating RN:  Montey Hora Primary Care Provider: Lorelee Market Other Clinician: Referring Provider: Lorelee Market Treating Provider/Extender: Melburn Hake, Haelie Clapp Weeks in Treatment: 3 Subjective Chief Complaint Information obtained from Patient Bilateral foot ulcers History of Present Illness (HPI) Pleasant 57 year old with history of diabetes (Hgb A1c 10.8 in 2014) and peripheral neuropathy. No PVD. L ABI 1.1. Status post right great toe partial amputation years ago. He was at work and on 10/22/2014, was injured by a cart, and suffered an ulceration to his left anterior calf. He says that it subsequently became infected, and he was treated with a course of antibiotics. He was found on initial exam to have an ulceration on the dorsum of his left third toe. He was unaware of this and attributes it to pressure from his steel toed boots. More recently he injured his right anterior calf on a cart. Ambulating normally per his baseline. He has been undergoing regular debridements, applying mupirocin cream, and an Ace wrap for edema control. He returns to clinic for follow-up and is without complaints. No pain. No fever or chills. No drainage. 10/25/15; this is a 57 year old man who has type II diabetes with diabetic polyneuropathy. He tells me that he fractured his left fifth metatarsal in June 2016 when he presented with swelling. He does not recall a specific injury. His hemoglobin A1c was apparently too high at the time for consideration of surgery and he was put in some form of offloading. Ultimately he went to surgery in December with an allograft from his calcaneus to this site, plate and screws. He had an x-ray of the foot in March that showed concerns about nonunion. He tells me that in March he had to move and basically moved himself. He was on his foot a lot and then noticed some drainage from an open area.  He has been following with his orthopedic surgeon Dr. Doran Durand. He has been applying a  felt donut, dry dressing and using his heel healing sandal. 11/01/15; this is a patient I saw last week for the first time. He had a small open wound on the plantar aspect of his left foot at roughly the level of the base of his fifth metatarsal. He had a considerable degree of thickened skin around this wound on the plantar aspect which I thought was from chronic pressure on this area. He tells [place] that he had drainage over the course of the week. No systemic symptoms. 11/08/15; culture last week grew Citrobacter korseri. This should've been sensitive to the Augmentin I gave him. He has seen Dr. Doran Durand who did his initial surgery and according the patient the plan is to give this another month and then the hardware might need to come out of this. This seems like a reasonable plan. I will adjust his antibiotics to ciprofloxacin which probably should continue for at least another 2 weeks. I gave him 10 days worth today 11/22/15 the patient has completed antibiotics. He has an appointment with Dr. Doran Durand this Friday. There is improved dimensions around the wound on the left fifth metatarsal base 11/29/15; the patient has completed antibiotics last week. Apparently his appointment with Dr. Doran Durand it is not until this Friday. Dimensions are roughly the same. 12/06/15; saw Dr. Doran Durand. No x-ray told the end of the month, next appointment June 30. We have been using Aquacel Ag 12/13/15: No major change this week. Using Aquacel AG 621/17; arrived this week with maceration around the wound. There was quite a bit of undermining which required surgical debridement. I changed him to [facility] last week, by the patient's admission he was up on this more this week 12/27/15; macerated tissue around the wound is removed with a scalpel and pickups. There is no undermining. Nonviable subcutaneous tissue and skin taken from the superior circumference of the wound is slough from the surface. READMISSION 03/06/16 since I  last saw this patient at the end of June, he went for surgery on 01/11/16 by Dr. Doran Durand of orthopedics. He had a left foot irrigation and debridement, removal of hardware and placement of wound VAC. He is also been followed by Dr. Megan Salon of infectious disease and completed a six-week course of IV Rocephin for group B strep and Enterobacter in the [name], [name]. ([id number]) bone at the time of surgery. Apparently at the time of surgery the bone looked healthy so I don't think any bone was actually removed. He has been using silver alginate based dressings on the same wound area at the base of the left fifth metatarsal on the left. I note that he is also had arterial studies on 01/08/16, these showed a left ABI of 1.2 to and a right ABI of 1.3. Waveforms were listed as biphasic. He was not felt to have any specific arterial issues. 03/13/16; no real change in the condition of the wound at the left lateral foot at roughly the base of his fifth metatarsal. Use silver alginate last week. 03/18/16 arrives today with no open area. Being suspicious of the overlying callus I. Some of this back although I see nothing but covering tissue here/epithelium. There is no surrounding tenderness READMISSION 04/16/16 this is a patient I discharged about a month ago. Initially a surgical wound on the lateral aspect of his left foot which subsequently became infected. The story he is giving today that he  went back into his own modified shoe started to notice pain 2 weeks ago he was seen in Dr. Nona Dell office by a physician assistant last Wednesday and according the patient was told that everything looked fine however this is clearly now broken open and he has an open wound in the same spot that we have been dealing with repetitively. Situation is complicated by the fact that he is running short of money on long-term disability. He has not taken his insulin and at least 2 weeks was previously on NovoLog short acting  insulin on a sliding scale and TRESIBA 35 units at bedtime. He is no longer able to afford any of his medication he was in the x-ray on 10/15. A plain x-ray showed healed fracture of the left fifth metatarsal bone status post removal of the associated plate and screw fixation hardware. There was no acute appearing osseous abnormality. His blood work showed a white count of 9.2 with an essentially normal differential comprehensive metabolic panel was normal. Previous CT scan of the foot on 01/08/16 showed no osteomyelitis previous vascular workup showed no evidence of significant PAD on 01/13/16 04/23/16; culture I did last week grew enterococcus [ampicillin sensitive] and MRSA. He saw infectious disease yesterday. They stop the clindamycin and ordered an MRI. This is not unreasonable. All the hardware is out of the foot at this point. 04/30/16 at this point in time we are still awaiting the results of the MRI at this point in time. Patient did have an area which appears to be somewhat macerated in the proximal portion of the wound where there is overlying necrotic skin that is doing nothing more than trapping fluid underneath. He continues to state that he does have some discomfort and again the concern is for the possibility of osteomyelitis hence the reason for the MRI order. We have been using a silver alginate dressing but again I think the reason this with his macerated as it was is that the dressing obscene could not reach the entirety of the wound due to some necrotic skin covering the proximal portion. No bleeding noted at this point in time on initial evaluation. 05/07/16 we receive the results of patient's MRI which shows that he has no evidence of osteomyelitis. This obviously is excellent news. Patient was definitely happy to hear this. He tells me the wound appears to be doing very well at this point in time and is pleased with the progress currently. 05/15/16; as noted the patient did not  have osteomyelitis. He has been released by infectious disease and orthopedics. His wound is still open he had a debridement last week but complain when he got home he "bleeding for half a day". He is not had any pain. We have been using silver alginate with Kerlix gauze wrap. 05/28/16; patient arrives today with the wound in much the same condition as last time. He has a small opening on the lateral aspect of his foot however with debridement there is clear undermining medially there is no real evidence of infection here and I didn't see any point in culturing this. One would have to wonder if this isn't a simple matter of in adequate offloading as he is been using a healing sandal. 06/05/16; the open area is now on the plantar aspect of his foot and not decide. The wound almost appears to have "migrated". This was the term use by our intake nurse. 06/12/16; open area on the plantar aspect of his foot. Base of the wound  looks very healthy. This will be his second week in a total contact cast 06/19/16; patient arrived today in a total contact cast. There was some expectation from our staff and myself that this area would be healed. Unfortunately the area was boggy and with rec pressure a fairly substantial amount of purulent drainage was obtained. Specimen obtained for culture. The patient had no complaints of systemic problems including fever or chills or instability of his diabetes. There was no pain in the foot. Nevertheless a extensive debridement was required. 06/26/16; patient's culture from the abscess last week grew a combination of MRSA and ampicillin sensitive enterococcus. I had him on Augmentin and Septra however I have elected to give him a full 10 day course of Zyvox instead as I Recently treated this combination of organisms with Augmentin and Septra before. Arrives today with no systemic symptoms 07/03/16; the patient has 2 more treatments of Zyvox and then he is finished antibiotics  the wound has improved now mostly on the lateral aspect of his foot. There is still some tenderness when he walks. 07/10/16; patient arrives today with Zyvox completed. He only has a small open area remaining. 07/24/16; she arrives here with no overt open area. The covering is thick callus/eschar. Nevertheless there is no open area here. He has some tenderness underneath the area but no overt infection is observed no drainage. The patient has a deformity in the foot with this area weekly to be exposed to more pressure in his foot where nevertheless that something we are going to have to deal with going forward. The patient has diabetic workboots and diabetic shoes. He has had a long SUSANO, CLECKLER. ([id number]) difficult course with the area here. This started as a fracture at work. He had bone grafting from his calcaneus and screws. This got infected he had to have more surgery on the area. Bone at the time of that surgery I think showed enterococcus and group B strep. He had 6 weeks of Rocephin. Since then the area has waxed and waned in its difficulty. Recently he had an MRI in December that did not show osteomyelitis nevertheless he had an abscess that grew MRSA and enterococcus which I elected to treat with Zyvox. This was in December and this wound is actually "healed" over 08/21/16; the patient came in today for his one-month follow-up visit. The area on the lateral aspect of his left foot looks much the same as some month ago. There is no evidence of an open wound here. However the patient tells me about a week after he went back to work he developed severe pain and swelling in the plantar aspect of his right foot first and fifth metatarsal heads. 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 He went to see his podiatrist at [facility]. She told him that he could not work on his feet. She told him to go back  and his cam walking boot on the left. Not have open wounds obviously on the right. The patient is actually gone ahead and retired from his job because he does not feel he can work on his feet 09/18/16; patient comes back in saying he recently had some pain on the lateral aspect of his left foot. Asked that we look at this. Other than that all of his wounds have a viable surface. He has diabetic shoes. He is retired from work. READMISSION Mister  Alred is a 57 year old man who we cared for for a prolonged period of time in late 2017 into March 2018. At that point he had suffered a fracture of his left fifth metatarsal at about the level of the base of the fifth metatarsal. He had surgical repair by Dr. Meryl Dare and he developed a very refractory wound over the fifth metatarsal. Ultimately this became infected he required hardware removal this eventually closed over and we discharged from the clinic in March of this year. He tells me as well until roughly 3 weeks ago when he developed increasing pain in the same area making it difficult for him to sleep. He was admitted to hospital from 9/5 through 9/7 with now an ulcer in the same area on the lateral aspect of his left foot at the level of the base of the metatarsal. X-rays and MRIs during this hospitalization were negative. Wound culture showed a combination of Escherichia coli, Morganella and Pseudomonas. He was given IV Cipro and vancomycin the hospital and then discharged home on Cipro and Doxy. He returned to the ER on 9/16 with leg swelling and discomfort. I don't think anything was added at that point although he did have an ultrasound of the left leg that was negative for DVT. He was back in the ER on 10/4 again with pain in the area he was given Bactrim but states he had a significant allergic reaction to that which has abated once he stopped the Bactrim. I don't think he had a full course. He went to see his podiatrist yesterday who recommended  some form of foot soak. He has a Darco forefoot offloading boot The patient is a type II diabetic on insulin. Tells me his recent hemoglobin A1c was 7. Arterial studies done in July 2017 showed an ABI in the right of 1.3 on the left 1.2 to biphasic waveforms bilaterally. He was not felt to have arterial disease. As noted the patient has had 2 MRIs most recently on 10/4 this showed no evidence of an abscess or osteomyelitis and no change since the prior study of 03/05/17 there was nonspecific edema on the dorsum of the foot. ABIs in this clinic today 1.2 which would be unchanged from his study done in July/17 04/15/17; now down to 1 small wound on the left lateral foot. We wrapped him and applied collagen last week and the foot is fairly macerated. 04/22/17; small wound on the left lateral foot however it has some undermining. Using collagen 04/29/17; small wound on the left lateral foot some surrounding callus. Chronic damage in this area as a result of initial fracture nonhealing and secondary infection. 05/06/17; the patient arrives today with the area on the left lateral foot closed. There is thick subcutaneous tissue with a layer of callus. I took some of the callus off just to ensure adequate closure of the underlying tissue and there is no open wound here. He has considerable deformity of this area of his foot however and unless there is an ability to offload this I think opening is going to be recurrent. He tells me he has diabetic foot wear although I have not actually seen this Readmission: 09/02/17 on evaluation today patient appears to be doing a little better compared to what he tells me what's going on in regard to his left lateral foot. He was previously seen in the ER this past Saturday it is now Tuesday. On Saturday he actually was treated with antibiotics including Cipro 500 mg two  times a day and doxycycline 100 mg two times a day. Subsequently he also did have an x-ray performed of  his left foot which revealed increased degenerative changes of the base of the fifth metatarsal since comparison in 2018. There was no radiographic evidence of osteomyelitis however. Patient has previously had an MRI of the left foot which was performed on 04/03/17 in this revealed at that point a soft tissue ulceration but no evidence of osteomyelitis. Patient has previously undergone testing in regard to his ABI's by Dr. Bridgett Larsson and this showed that he did have ABI was within normal limits which does correspond with ABI's we checked here in the office today. That study was on 01/13/16. With all that being said on evaluation today there does not appear to be any evidence of a true open wound of the left lateral fifth metatarsal region. He does have tenderness noted as well is a little bit of boggy/fluctuance feeling to the [name], [name]. ([id number]) area in this region of his foot and there is definitely a very solid and firm callous noted laterally. With that being said I am not able to find any obvious opening at this point there does appear to be a spot where there appears to been a opening very recently however patient states he has not noted any drainage from this currently. He does however have discomfort with palpation of this area this is in the 3-4/10 range only with very firm palpation this is not too significant at all. Subsequently he does have a small ulcer at the medial nail bed of the right second toe where he inadvertently pulled off a portion of the toenail softly causing this injury. Otherwise there does not appear to be any evidence of infection in regard to this right second toe. 09/23/17 on evaluation today patient actually appears to be doing well he has no open ulcerations noted at this point. With that being said he did have significant callous noted over the left lateral foot which did require callous pairing today he tolerated this without complication. Readmission: 11/07/17  patient presents today for readmission concerning to ulcers that he has. One on the right plantar fifth metatarsal region and the other on the left plantar fifth metatarsal region. He has previously had an x-ray which was performed on 11/05/17 and revealed that the patient had wounds noted of the base of the fifth metatarsal region without evidence of destruction to the bone to suggest osteomyelitis. Obviously this is excellent news. With that being said he does have a significantly large ulcer that extensively plantar surface of the wound and is concerning for the fact that he continues to walk on this due to the patient telling me that he "has to work" I do believe this has likely led to both the opening of the wound as well as the fact that is not really able to heal very well at this point. He has been seen by his podiatrist and apparently according to the note dated 10/23/17 the patient was provided with Santyl and recommended a dry dressing. No wound culture has been obtained at this point. As stated above he has had returned to work due to financial reasons and is not able to wear his postop shoes due to the fact that he cannot wear this and work. The wound is stated to have been present for "several weeks" prior to the visit with the podiatrist on 10/23/17. With that being said again at least the good news is  there does not appear to be any evidence of osteomyelitis. 11/14/17 on evaluation today patient appears to be doing poorly in regard to his bilateral foot ulcers. Unfortunately he seems to have significant callous with a lot of maceration underlined especially on the right even more so than the left. He has been tolerating the dressing changes without complication. With that being said overall I do think he's gonna have to have a significant debridement today. 11/21/17 on evaluation today patient presents for follow-up concerning his bilateral lateral foot ulcers. He has been tolerating the  dressing changes without complication. His wife helps perform these for him. With that being said the patient states that the left lateral foot ulcer actually is continuing to give him trouble as far as discomfort is concerned. With that being said actually appears better. 11/28/17 on evaluation today patient appears to be doing poorly in regard to his left lower extremity ulcer in particular. His right as made some progress on the lateral portion although the plantar portion is actually getting somewhat worse I think this is likely due to the fact that he continues to work although it hurts and he's been having a lot of issues out of this side and as far as pain is concerned. With that being said my biggest concern on evaluation today is that of the patient having issues with increased pain in the left lower extremity ulcer site especially on the plantar aspect. He also appears to have what seems to be an abscess developing in the dorsal/lateral portion of his foot which I think likely is communicating somewhere internally with the ulcer site. Nonetheless he has not had any fevers, chills, nausea, or vomiting noted at this time. He has however had fairly significant increase in pain and he was Artie having a lot of pain previous. Wound History Patient presents with 2 open wounds that have been present for approximately 2 weeks. Patient has been treating wounds in the following manner: unknown. Laboratory tests have not been performed in the last month. Patient reportedly has tested positive for an antibiotic resistant organism. Patient reportedly has tested positive for osteomyelitis. Patient reportedly has not had testing performed to evaluate circulation in the legs. Patient History Information obtained from Patient. Family History Diabetes - Mother,Father, Heart Disease - Father,Mother, Hypertension - Mother,Father, Stroke - Father, No family history of Cancer, Hereditary Spherocytosis, Kidney  Disease, Lung Disease, Seizures, Thyroid Problems, Tuberculosis. JAYKOB, MINICHIELLO ([id number]) Social History Never smoker, Marital Status - Married, Alcohol Use - Never, Drug Use - No History, Caffeine Use - Daily. Medical History Eyes Denies history of Cataracts, Glaucoma, Optic Neuritis Ear/Nose/Mouth/Throat Denies history of Chronic sinus problems/congestion, Middle ear problems Hematologic/Lymphatic Denies history of Anemia, Hemophilia, Human Immunodeficiency Virus, Lymphedema, Sickle Cell Disease Respiratory Denies history of Aspiration, Asthma, Chronic Obstructive Pulmonary Disease (COPD), Pneumothorax, Sleep Apnea, Tuberculosis Cardiovascular Patient has history of Hypertension Denies history of Angina, Arrhythmia, Congestive Heart Failure, Coronary Artery Disease, Deep Vein Thrombosis, Hypotension, Myocardial Infarction, Peripheral Arterial Disease, Peripheral Venous Disease, Phlebitis, Vasculitis Gastrointestinal Denies history of Cirrhosis , Colitis, Crohn s, Hepatitis A, Hepatitis B, Hepatitis C Endocrine Patient has history of Type II Diabetes Denies history of Type I Diabetes Genitourinary Denies history of End Stage Renal Disease Immunological Denies history of Lupus Erythematosus, Raynaud s, Scleroderma Integumentary (Skin) Denies history of History of Burn, History of pressure wounds Musculoskeletal Denies history of Gout, Rheumatoid Arthritis, Osteoarthritis, Osteomyelitis Neurologic Patient has history of Neuropathy Denies history of Dementia, Quadriplegia, Paraplegia, Seizure Disorder  Oncologic Denies history of Received Chemotherapy, Received Radiation Psychiatric Denies history of Anorexia/bulimia, Confinement Anxiety Patient is treated with Insulin. Blood sugar is tested. Review of Systems (ROS) Constitutional Symptoms (General Health) Denies complaints or symptoms of Fever, Chills. Respiratory The patient has no complaints or  symptoms. Cardiovascular The patient has no complaints or symptoms. Psychiatric The patient has no complaints or symptoms. Objective AINSLEY, SANGUINETTI ([id number]) Constitutional Well-nourished and well-hydrated in no acute distress. Vitals Time Taken: 3:48 PM, Height: 70 in, Weight: 265 lbs, BMI: 38, Temperature: 98.3 F, Pulse: 98 bpm, Respiratory Rate: 16 breaths/min, Blood Pressure: 160/72 mmHg. General Notes: Patient has not taken his blood pressure meds today. Respiratory normal breathing without difficulty. clear to auscultation bilaterally. Cardiovascular regular rate and rhythm with normal S1, S2. no clubbing, cyanosis, significant edema, Psychiatric this patient is able to make decisions and demonstrates good insight into disease process. Alert and Oriented x 3. pleasant and cooperative. General Notes: At this point on inspection both of the patient's wounds on the plantar aspect appear to be doing more poorly laterally both appear to be doing fairly well. He does have what appears to be an abscess beginning to develop over the lateral/dorsal portion of his left foot. With that being said this is what has me most concern at this point. No debridement performed today. Integumentary (Hair, Skin) Wound #12 status is Open. Original cause of wound was Gradually Appeared. The wound is located on the Left,Lateral Foot. The wound measures 0.6cm length x 1.4cm width x 0.4cm depth; 0.66cm^2 area and 0.264cm^3 volume. There is Fat Layer (Subcutaneous Tissue) Exposed exposed. There is no tunneling noted, however, there is undermining starting at 12:00 and ending at 12:00 with a maximum distance of 0.9cm. There is a large amount of serosanguineous drainage noted. The wound margin is distinct with the outline attached to the wound base. There is large (67-100%) red granulation within the wound bed. There is a small (1-33%) amount of necrotic tissue within the wound bed including Eschar and  Adherent Slough. The periwound skin appearance exhibited: Callus. The periwound skin appearance did not exhibit: Crepitus, Excoriation, Induration, Rash, Scarring, Dry/Scaly, Maceration, Atrophie Blanche, Cyanosis, Ecchymosis, Hemosiderin Staining, Mottled, Pallor, Rubor, Erythema. Periwound temperature was noted as No Abnormality. The periwound has tenderness on palpation. Wound #13 status is Open. Original cause of wound was Gradually Appeared. The wound is located on the Right,Plantar Metatarsal head first. The wound measures 1.5cm length x 2.5cm width x 0.9cm depth; 2.945cm^2 area and 2.651cm^3 volume. There is Fat Layer (Subcutaneous Tissue) Exposed exposed. There is no tunneling noted, however, there is undermining starting at 12:00 and ending at 2:00 with a maximum distance of 1.2cm. There is a medium amount of serous drainage noted. The wound margin is distinct with the outline attached to the wound base. There is medium (34-66%) red, pink granulation within the wound bed. There is a medium (34-66%) amount of necrotic tissue within the wound bed including Eschar and Adherent Slough. The periwound skin appearance exhibited: Callus, Maceration. The periwound skin appearance did not exhibit: Crepitus, Excoriation, Induration, Rash, Scarring, Dry/Scaly, Atrophie Blanche, Cyanosis, Ecchymosis, Hemosiderin Staining, Mottled, Pallor, Rubor, Erythema. Periwound temperature was noted as No Abnormality. The periwound has tenderness on palpation. Assessment Active Problems ICD-10 E11.621 - Type 2 diabetes mellitus with foot ulcer L97.522 - Non-pressure chronic ulcer of other part of left foot with fat layer exposed L97.512 - Non-pressure chronic ulcer of other part of right foot with fat layer exposed Locken, Aviel F. (  [id number]) Plan Wound Cleansing: Wound #12 Left,Lateral Foot: May Shower, gently pat wound dry prior to applying new dressing. Wound #13 Right,Plantar Metatarsal head first: May  Shower, gently pat wound dry prior to applying new dressing. Anesthetic (add to Medication List): Wound #12 Left,Lateral Foot: Topical Lidocaine 4% cream applied to wound bed prior to debridement (In Clinic Only). Wound #13 Right,Plantar Metatarsal head first: Topical Lidocaine 4% cream applied to wound bed prior to debridement (In Clinic Only). Primary Wound Dressing: Wound #12 Left,Lateral Foot: Silver Alginate Wound #13 Right,Plantar Metatarsal head first: Silver Alginate Secondary Dressing: Wound #12 Left,Lateral Foot: Gauze, ABD and Kerlix/Conform - offload with foam or felt Wound #13 Right,Plantar Metatarsal head first: Gauze, ABD and Kerlix/Conform - offload with foam or felt Dressing Change Frequency: Wound #12 Left,Lateral Foot: Change dressing every day. Wound #13 Right,Plantar Metatarsal head first: Change dressing every day. Follow-up Appointments: Wound #12 Left,Lateral Foot: Return Appointment in 1 week. Wound #13 Right,Plantar Metatarsal head first: Return Appointment in 1 week. Additional Orders / Instructions: Wound #12 Left,Lateral Foot: Increase protein intake. Wound #13 Right,Plantar Metatarsal head first: Increase protein intake. General Notes: Patient advised to go to [facility] ED r/t possible abscess of left foot At this point my suggestion for the patient was that we continue with the silver alginate dressing although I'm recommending that he go to the hospital through the ER for evaluation today and possible IV antibiotic therapy due to the fact that he seems to have an abscess forming internally in the dorsal/lateral portion of this foot which I believe is likely communicating with the wound. With that being said I cannot get anything to express and obviously I did not want to press on this to significantly due to the worry about spreading infection internally. I really believe that IV antibiotic therapy may become necessary that I explained to the patient  that obviously I do not have access to testing his white blood cell count or other studies that may be be necessary before making that decision. Obviously I'll leave this to the ER physicians who evaluate him. At this time we will continue however with the above wound care orders in the interim no antibiotics were prescribed at this point I will leave that to the ER physicians as well. Please see above for specific wound care orders. We will see patient for re-evaluation in 1 week(s) here in the clinic. If anything worsens or changes patient will contact our office for additional recommendations. USTIN, CRUICKSHANK ([id number]) Electronic Signature(s) Signed: 11/28/2017 4:49:33 PM By: Worthy Keeler PA-C Entered By: Worthy Keeler on 11/28/2017 16:48:42 DAVIED, NOCITO ([id number]) -------------------------------------------------------------------------------- ROS/PFSH Details Patient Name: KANON, [name]. Date of Service: 11/28/2017 3:30 PM Medical Record Number: [id number] Patient Account Number: [id number] Date of Birth/Sex: 11-21-1960 (57 y.o. M) Treating RN: Montey Hora Primary Care Provider: Lorelee Market Other Clinician: Referring Provider: Lorelee Market Treating Provider/Extender: Melburn Hake, Jahmya Onofrio Weeks in Treatment: 3 Label Progress Note Print Version as History and Physical for this encounter Information Obtained From Patient Wound History Do you currently have one or more open woundso Yes How many open wounds do you currently haveo 2 Approximately how long have you had your woundso 2 weeks How have you been treating your wound(s) until nowo unknown Has your wound(s) ever healed and then re-openedo No Have you had any lab work done in the past montho No Have you tested positive for osteomyelitis (bone infection)o Yes Have you had any tests for circulation on your  legso No Constitutional Symptoms (General Health) Complaints and Symptoms: Negative for: Fever;  Chills Eyes Medical History: Negative for: Cataracts; Glaucoma; Optic Neuritis Ear/Nose/Mouth/Throat Medical History: Negative for: Chronic sinus problems/congestion; Middle ear problems Hematologic/Lymphatic Medical History: Negative for: Anemia; Hemophilia; Human Immunodeficiency Virus; Lymphedema; Sickle Cell Disease Respiratory Complaints and Symptoms: No Complaints or Symptoms Medical History: Negative for: Aspiration; Asthma; Chronic Obstructive Pulmonary Disease (COPD); Pneumothorax; Sleep Apnea; Tuberculosis Cardiovascular Complaints and Symptoms: No Complaints or Symptoms Medical History: Positive for: Hypertension Negative for: Angina; Arrhythmia; Congestive Heart Failure; Coronary Artery Disease; Deep Vein Thrombosis; Hypotension; BURYL, BAMBER ([id number]) Myocardial Infarction; Peripheral Arterial Disease; Peripheral Venous Disease; Phlebitis; Vasculitis Gastrointestinal Medical History: Negative for: Cirrhosis ; Colitis; Crohnos; Hepatitis A; Hepatitis B; Hepatitis C Endocrine Medical History: Positive for: Type II Diabetes Negative for: Type I Diabetes Time with diabetes: 20 yrs Treated with: Insulin Blood sugar tested every day: Yes Tested : 3 times weekly Genitourinary Medical History: Negative for: End Stage Renal Disease Immunological Medical History: Negative for: Lupus Erythematosus; Raynaudos; Scleroderma Integumentary (Skin) Medical History: Negative for: History of Burn; History of pressure wounds Musculoskeletal Medical History: Negative for: Gout; Rheumatoid Arthritis; Osteoarthritis; Osteomyelitis Neurologic Medical History: Positive for: Neuropathy Negative for: Dementia; Quadriplegia; Paraplegia; Seizure Disorder Oncologic Medical History: Negative for: Received Chemotherapy; Received Radiation Psychiatric Complaints and Symptoms: No Complaints or Symptoms Medical History: Negative for: Anorexia/bulimia; Confinement  Anxiety Immunizations Pneumococcal Vaccine: Received Pneumococcal Vaccination: No NAMIR, NETO ([id number]) Immunization Notes: up to date Implantable Devices Family and Social History Cancer: No; Diabetes: Yes - Mother,Father; Heart Disease: Yes - Father,Mother; Hereditary Spherocytosis: No; Hypertension: Yes - Mother,Father; Kidney Disease: No; Lung Disease: No; Seizures: No; Stroke: Yes - Father; Thyroid Problems: No; Tuberculosis: No; Never smoker; Marital Status - Married; Alcohol Use: Never; Drug Use: No History; Caffeine Use: Daily; Financial Concerns: No; Food, Clothing or Shelter Needs: No; Support System Lacking: No; Transportation Concerns: No; Advanced Directives: No; Patient does not want information on Advanced Directives; Do not resuscitate: No; Living Will: No; Medical Power of Attorney: No Physician Affirmation I have reviewed and agree with the above information. Electronic Signature(s) Signed: 11/28/2017 4:49:33 PM By: Worthy Keeler PA-C Signed: 11/28/2017 5:45:37 PM By: Montey Hora Entered By: Worthy Keeler on 11/28/2017 16:45:43 MOREY, ANDONIAN ([id number]) -------------------------------------------------------------------------------- SuperBill Details Patient Name: [name], [name]. Date of Service: 11/28/2017 Medical Record Number: [id number] Patient Account Number: [id number] Date of Birth/Sex: 1960/07/15 (57 y.o. M) Treating RN: Montey Hora Primary Care Provider: Lorelee Market Other Clinician: Referring Provider: Lorelee Market Treating Provider/Extender: Melburn Hake, Brandell Maready Weeks in Treatment: 3 Diagnosis Coding ICD-10 Codes Code Description E11.621 Type 2 diabetes mellitus with foot ulcer L97.522 Non-pressure chronic ulcer of other part of left foot with fat layer exposed L97.512 Non-pressure chronic ulcer of other part of right foot with fat layer exposed Facility Procedures CPT4 Code: 14431540 Description: 99213 - WOUND CARE  VISIT-LEV 3 EST PT Modifier: Quantity: 1 Physician Procedures CPT4 Code: 0867619 Description: 50932 - WC PHYS LEVEL 4 - EST PT ICD-10 Diagnosis Description E11.621 Type 2 diabetes mellitus with foot ulcer L97.522 Non-pressure chronic ulcer of other part of left foot with fat L97.512 Non-pressure chronic ulcer of other part of right  foot with fat Modifier: layer exposed layer exposed Quantity: 1 Electronic Signature(s) Signed: 11/28/2017 4:49:33 PM By: Worthy Keeler PA-C Entered By: Worthy Keeler on 11/28/2017 16:49:07

## 2017-11-30 NOTE — Progress Notes (Signed)
[facility] [place] NAME: [name]    MR#:  [id number]  DATE OF BIRTH:  Aug 28, 1960  SUBJECTIVE: [name] admitted for diabetic foot infection of both feet, started on vancomycin, cefepime, seen by podiatry.  MRI of both feet showed osteomyelitis of left fifth fifth metatarsal.  Also right fifth metatarsal amputation, debridement of the left foot by podiatry today.  CHIEF COMPLAINT:   Chief Complaint  [name] presents with  . Wound Infection   [name] is scheduled for fifth ray amputation especially on the left foot by podiatry today. [name] denies any complaints but worried about postoperative recovery.  REVIEW OF SYSTEMS:   ROS CONSTITUTIONAL: No fever, fatigue or weakness.  EYES: No blurred or double vision.  EARS, NOSE, AND THROAT: No tinnitus or ear pain.  RESPIRATORY: No cough, shortness of breath, wheezing or hemoptysis.  CARDIOVASCULAR: No chest pain, orthopnea, edema.  GASTROINTESTINAL: No nausea, vomiting, diarrhea or abdominal pain.  GENITOURINARY: No dysuria, hematuria.  ENDOCRINE: No polyuria, nocturia,  HEMATOLOGY: No anemia, easy bruising or bleeding SKIN: No rash or lesion. MUSCULOSKELETAL: No joint pain or arthritis.   NEUROLOGIC: No tingling, numbness, weakness.  PSYCHIATRY: No anxiety or depression.   DRUG ALLERGIES:   Allergies  Allergen Reactions  . Bactrim [Sulfamethoxazole-Trimethoprim] Hives    VITALS:  Blood pressure 136/86, pulse 81, temperature 97.9 F (36.6 C), temperature source Oral, resp. rate 18, height 5\' 10"  (1.778 m), weight 111.9 kg (246 lb 11.2 oz), SpO2 100 %.  PHYSICAL EXAMINATION:  GENERAL:  57 y.o.-year-old [name] lying in the bed with no acute distress.  EYES: Pupils equal, round, reactive to light and accommodation. No scleral icterus. Extraocular muscles intact.  HEENT: Head atraumatic, normocephalic. Oropharynx and nasopharynx clear.  NECK:  Supple, no jugular venous  distention. No thyroid enlargement, no tenderness.  LUNGS: Normal breath sounds bilaterally, no wheezing, rales,rhonchi or crepitation. No use of accessory muscles of respiration.  CARDIOVASCULAR: S1, S2 normal. No murmurs, rubs, or gallops.  ABDOMEN: Soft, nontender, nondistended. Bowel sounds present. No organomegaly or mass.  EXTREMITIES: Dressing present for both feet/ nEUROLOGIC: Cranial nerves II through XII are intact. Muscle strength 5/5 in all extremities. Sensation intact. Gait not checked.  PSYCHIATRIC: The [name] is alert and oriented x 3.  SKIN: No obvious rash, lesion, or ulcer.    LABORATORY PANEL:   CBC Recent Labs  Lab 11/29/17 0549  WBC 7.5  HGB 12.8*  HCT 37.1*  PLT 210   ------------------------------------------------------------------------------------------------------------------  Chemistries  Recent Labs  Lab 11/28/17 1934 11/29/17 0549 11/30/17 0756  NA 134* 136  --   K 3.6 3.4*  --   CL 97* 102  --   CO2 27 25  --   GLUCOSE 196* 109*  --   BUN 15 10  --   CREATININE 0.82 0.64 0.69  CALCIUM 8.9 8.4*  --   AST 17  --   --   ALT 15*  --   --   ALKPHOS 105  --   --   BILITOT 0.5  --   --    ------------------------------------------------------------------------------------------------------------------  Cardiac Enzymes No results for input(s): TROPONINI in the last 168 hours. ------------------------------------------------------------------------------------------------------------------  RADIOLOGY:  Mr Foot Right W Wo Contrast  Result Date: 11/29/2017 CLINICAL DATA:  Diabetic [name] with a skin ulceration at the head of the right fifth metatarsal. EXAM: MRI OF THE RIGHT FOREFOOT WITHOUT AND WITH CONTRAST TECHNIQUE: Multiplanar, multisequence MR imaging of the right forefoot was  performed before and after the administration of intravenous contrast. CONTRAST:  20 ml MULTIHANCE GADOBENATE DIMEGLUMINE 529 MG/ML IV SOLN COMPARISON:  Plain films  right foot 06/26/2015. FINDINGS: Bones/Joint/Cartilage Edema and enhancement seen in the distal 2 cm of the fifth metatarsal consistent osteomyelitis. A milder degree of edema and enhancement are also seen in the proximal 1.2 cm of the proximal phalanx of the little toe. Bone marrow signal is otherwise unremarkable. Ligaments Intact. Muscles and Tendons Intact.  There is fatty atrophy of musculature of the foot. Soft tissues Skin ulceration is seen deep to the head of the fifth metatarsal. IMPRESSION: Skin ulceration deep to the head of the fifth metatarsal without underlying abscess. Findings consistent with osteomyelitis in the distal 2 cm of the fifth metatarsal. Milder degree of edema and enhancement in the proximal 1.2 cm of the fifth toe are likely reactive but could be due to infection. Electronically Signed   By: Inge Rise M.D.   On: 11/29/2017 13:38   Mr Foot Left W Wo Contrast  Result Date: 11/29/2017 CLINICAL DATA:  Skin ulceration at the base of the left fifth metatarsal in a diabetic [name]. EXAM: MRI OF THE LEFT FOREFOOT WITHOUT AND WITH CONTRAST TECHNIQUE: Multiplanar, multisequence MR imaging of the plain films left foot 11/28/2017. Was performed both before and after administration of intravenous contrast. CONTRAST:  20 ml MULTIHANCE GADOBENATE DIMEGLUMINE 529 MG/ML IV SOLN COMPARISON:  None. FINDINGS: Bones/Joint/Cartilage The [name] has a remote healed fracture of the proximal diaphysis of the fifth metatarsal. There is marrow edema and enhancement in the proximal 2.2 cm of the first metatarsal consistent with osteomyelitis. Extending approximately 2 cm distal to this area of osteomyelitis, the [name] has remote fracture of the fifth metatarsal. There are small foci of susceptibility artifact just distal to this area of abnormal signal suggestive of prior surgery. Mild increased T2 signal is seen in the fifth metatarsal the site of the fracture. Bone marrow signal is otherwise  unremarkable. Ligaments Intact. Muscles and Tendons There is atrophy of intrinsic musculature the foot. No intramuscular fluid collection. Soft tissues A skin ulceration is seen deep to the fifth metatarsal base which extends to bone. A thin collection measuring 1.1 cm long by 0.7 cm transverse by 0.4 cm craniocaudal is seen along the base of the fifth metatarsal proximal to the ulceration consistent with abscess. IMPRESSION: Skin ulceration on the plantar surface of the foot at the base of the fifth metatarsal. A small underlying abscess is nearly contiguous with proximal fifth metatarsal. Edema and enhancement in the proximal 2.2 cm of the fifth metatarsal consistent with osteomyelitis. Remote fracture of the proximal diaphysis of the fifth metatarsal with associated foci of susceptibility artifact suggestive of prior surgery. Increased T2 signal at the fracture site may be secondary to the fracture but could represent extension of osteomyelitis. Electronically Signed   By: Inge Rise M.D.   On: 11/29/2017 13:29   Dg Foot Complete Left  Result Date: 11/28/2017 CLINICAL DATA:  Diabetic foot ulcer EXAM: LEFT FOOT - COMPLETE 3+ VIEW COMPARISON:  None. FINDINGS: No fracture or dislocation is seen. The joint spaces are preserved. Mild cortical thickening/irregularity involving the base of the 5th metatarsal, chronic. No definite erosions. Overlying soft tissue swelling/ulceration. IMPRESSION: Soft tissue swelling/ulceration along the lateral base of the 5th metatarsal. No definite radiographic findings to suggest acute osteomyelitis. Electronically Signed   By: Julian Hy M.D.   On: 11/28/2017 21:56    EKG:   Orders placed or performed  during the hospital encounter of 06/08/15  . EKG 12 lead  . EKG 12 lead    ASSESSMENT AND PLAN:   Acute osteomyelitis of right fifth metatarsal secondary to diabetes mellitus: [name] is taken to the OR by podiatry for possible right fifth amputation,  continue vancomycin, cefepime, Podiatry discussed with [name], [name] wife extensively about postoperative recovery, walking and ability to get around in future.  Bilateral foot cellulitis, [name] went for right fifth metatarsal amputation, left foot debridement by podiatry, continue vancomycin, cefepime and see how he does.  #2 PVD, nonpalpable pedal pulse and chronic diabetic foot ulcers recommend bilateral leg angiogram, likely next week #3 diabetes mellitus type 2: [name] is on Lantus, sliding scale insulin with coverage.  Started gentle hydration while [name] is n.p.o.   All the records are reviewed and case discussed with Care Management/Social Workerr. Management plans discussed with the [name], family and they are in agreement.  CODE STATUS: Full code  TOTAL TIME TAKING CARE OF THIS [name]: 35 minutes.   POSSIBLE D/C IN 1-2 DAYS, DEPENDING ON CLINICAL CONDITION.   Epifanio Lesches M.D on 11/30/2017 at 12:30 PM  Between 7am to 6pm - Pager - [phone]  After 6pm go to [email or web address] - password [email or web address] [facility]  Office  [phone]  CC: Primary care physician; Lorelee Market, MD   Note: This dictation was prepared with Dragon dictation along with smaller phrase technology. Any transcriptional errors that result from this process are unintentional.

## 2017-11-30 NOTE — Anesthesia Preprocedure Evaluation (Signed)
Anesthesia Evaluation  Patient identified by MRN, date of birth, ID band Patient awake    Reviewed: Allergy & Precautions, H&P , NPO status , Patient's Chart, lab work & pertinent test results  History of Anesthesia Complications Negative for: history of anesthetic complications  Airway Mallampati: III  TM Distance: >3 FB Neck ROM: full    Dental  (+) Edentulous Upper, Edentulous Lower   Pulmonary neg pulmonary ROS, neg shortness of breath, sleep apnea and Continuous Positive Airway Pressure Ventilation ,           Cardiovascular Exercise Tolerance: Good hypertension, (-) angina(-) Past MI and (-) DOE      Neuro/Psych  Neuromuscular disease negative psych ROS   GI/Hepatic negative GI ROS, Neg liver ROS, neg GERD  ,  Endo/Other  diabetes, Type 2, Insulin Dependent  Renal/GU negative Renal ROS  negative genitourinary   Musculoskeletal  (+) Arthritis ,   Abdominal   Peds  Hematology negative hematology ROS (+)   Anesthesia Other Findings Past Medical History: No date: Arthritis No date: BPH (benign prostatic hypertrophy) No date: Diabetes mellitus No date: Diabetic neuropathy (HCC) No date: Hypertension No date: Metatarsal bone fracture     Comment:  left 5th toe No date: Osteomyelitis of toe of right foot (HCC) No date: Post-operative infection     Comment:  and diabetic ulcer left foot No date: Sleep apnea     Comment:   does not wear CPAP No date: Wears glasses  Past Surgical History: No date: COLONOSCOPY W/ BIOPSIES AND POLYPECTOMY 01/11/2016: I&D EXTREMITY; Left     Comment:  Procedure: LEFT FOOT IRRIGATION AND DEBRIDEMENT WOUND               VAC AND REMOVAL OF HARDWARE;  Surgeon: Wylene Simmer, MD;                Location: [place];  Service: Orthopedics;  Laterality:               Left; 06/08/2015: ORIF TOE FRACTURE; Left     Comment:  Procedure: OPEN REDUCTION INTERNAL FIXATION (ORIF) LEFT   FIFTH METATARSAL BASE FRACTURE NONUNION; CALCANEAL               AUTOGRAFT ;  Surgeon: Wylene Simmer, MD;  Location: [place];  Service: Orthopedics;  Laterality:               Left; 2005: PATELLA RECONSTRUCTION; Left 2014: PROSTATE ABLATION No date: TOE AMPUTATION     Comment:  partial amputation right great toe  BMI    Body Mass Index:  35.40 kg/m      Reproductive/Obstetrics negative OB ROS                             Anesthesia Physical Anesthesia Plan  ASA: III  Anesthesia Plan: General LMA   Post-op Pain Management:    Induction: Intravenous  PONV Risk Score and Plan: Ondansetron, Dexamethasone and Midazolam  Airway Management Planned: LMA  Additional Equipment:   Intra-op Plan:   Post-operative Plan: Extubation in OR  Informed Consent: I have reviewed the patients History and Physical, chart, labs and discussed the procedure including the risks, benefits and alternatives for the proposed anesthesia with the patient or authorized representative who has indicated his/her understanding and acceptance.   Dental Advisory Given  Plan Discussed  with: Anesthesiologist, CRNA and Surgeon  Anesthesia Plan Comments: (Patient consented for risks of anesthesia including but not limited to:  - adverse reactions to medications - damage to teeth, lips or other oral mucosa - sore throat or hoarseness - Damage to heart, brain, lungs or loss of life  Patient voiced understanding.)        Anesthesia Quick Evaluation

## 2017-11-30 NOTE — Transfer of Care (Signed)
Immediate Anesthesia Transfer of Care Note  Patient: [name]  Procedure(s) Performed: DEBRIDEMENT WOUND (Bilateral )  Patient Location: PACU  Anesthesia Type:General  Level of Consciousness: awake  Airway & Oxygen Therapy: Patient Spontanous Breathing and Patient connected to face mask oxygen  Post-op Assessment: Report given to RN and Post -op Vital signs reviewed and stable  Post vital signs: Reviewed  Last Vitals:  Vitals Value Taken Time  BP 172/92 11/30/2017 12:46 PM  Temp    Pulse 103 11/30/2017 12:46 PM  Resp 27 11/30/2017 12:46 PM  SpO2 100 % 11/30/2017 12:46 PM  Vitals shown include unvalidated device data.  Last Pain:  Vitals:   11/30/17 0820  TempSrc:   PainSc: 2          Complications: No apparent anesthesia complications

## 2017-11-30 NOTE — Op Note (Signed)
Date of operation: 11/30/2017.  Surgeon: Durward Fortes D.P.M.  Preoperative diagnosis:  1.  Osteomyelitis right fifth metatarsal head and toe. 2.  Osteomyelitis left fifth metatarsal base  Postoperative diagnoses same.  Procedures: 1.  Amputation right fifth toe with partial fifth ray resection. 2.  Debridement infected soft tissue and bone left fifth metatarsal  Anesthesia: LMA hemostasis: Pneumatic tourniquet bilateral ankles 250 mmHg.  Estimated blood loss: Less than 5 cc.  Pathology: Right fifth ray and bone left fifth metatarsal base.  Cultures: Bone cultures fifth metatarsal bilateral.  Injectables: 19 cc of 0.5% bupivacaine plain.  Implants: Stimulan rapid cure antibiotic beads impregnated with vancomycin.  Complications: None apparent.  Operative indications: This is a 57 year old male with a history of chronic ulceration on his left foot and more recent ulceration on his right foot who was admitted with worsening infection.  MRIs obtained yesterday revealed osteomyelitis in the fifth metatarsal bilateral.  Decision was made for the above-mentioned surgeries.  Operative procedures: Patient was taken to the operating room and placed on the table in the supine position.  Following satisfactory LMA anesthesia pneumatic tourniquets were applied at the level of the ankle bilateral.  Both feet were then prepped and draped in the usual sterile fashion.  The right foot was exsanguinated and the tourniquet inflated to 250 mmHg.     Attention was then directed to the lateral aspect of the right foot where an incision was made over the right fifth metatarsal with a medial and lateral ellipse around the base of the fifth toe.  Incision was carried sharply down to the level of the bone and deep dissection carried along the level of the bone to free the surrounding normal tissues.  Using a sagittal saw the distal one third of the fifth metatarsal was incised and the fifth ray was removed in  toto.  A bone culture was taken from the fifth metatarsal head.  The plantar ulceration and tissues within the wound was then debrided with a versa jet debrider on a setting of 5 and then all tissues thoroughly irrigated with the versa jet on a setting of 2.  The plantar wound was reapproximated with 3-0 nylon simple interrupted sutures and then the wound was packed with Stimulan rapid cure antibiotic beads impregnated with vancomycin and then the incision was reapproximated using 3-0 nylon simple interrupted sutures.  10 cc of 0.5% bupivacaine plain was then injected around the right foot for postoperative analgesia followed by Xeroform 4 x 4's ABD and Curlex.  The tourniquet was released on the right foot and the bandage was covered with a plastic bag for protection during the contralateral procedure.       Attention was then directed to the left foot where the foot was exsanguinated and the tourniquet inflated to 250 mmHg.  Attention was then directed to the lateral aspect of the left midfoot where an incision was made along the fifth metatarsal through the previous incision from his ORIF surgery and carried proximally and curvilinear along the distal peroneal tendon tract.  The incision was deepened via sharp and blunt dissection down to the level of the fifth metatarsal which was freed from the surrounding soft tissue attachments. Peroneus brevis tendon was intact with no evidence of any abscess or infection along the tendon sheath.  This was incised from the base of the fifth metatarsal and then the base of the metatarsal was transected using a sagittal saw and removed.  A bone culture was then taken.  The length of the removed bone was measured and this was slightly more than 2 cm so a secondary wedge of bone approximately 5 mm was then removed to ensure a clear margin as compared to what was identified on MRI.  The plantar ulceration and wound were then debrided with a versa jet debrider on a setting of 5  and then the tissue was thoroughly irrigated with the debrider on a setting of 2.  The proximal portion of the incision was then reapproximated using 3-0 nylon simple interrupted sutures and the plantar ulceration was covered with Mepitel and staples.  The remainder of the antibiotic beads were then placed into the wound and the remainder of the incision was reapproximated using 3-0 nylon simple interrupted sutures.  9 cc of 0.5% bupivacaine plain was then injected around the area for postoperative analgesia followed by Xeroform 4 x 4's ABDs and Kerlix.  The tourniquet was released on the left foot.  A second Kerlix and Ace wrap was then applied to both of the feet.  Patient was awakened and taken to the PACU with vital signs stable and in good condition.

## 2017-11-30 NOTE — Progress Notes (Signed)
Yellow colored ring placed back on left 4th finger by patient.

## 2017-11-30 NOTE — Anesthesia Post-op Follow-up Note (Signed)
Anesthesia QCDR form completed.        

## 2017-11-30 NOTE — Consult Note (Signed)
Pharmacy Antibiotic Note  [name] is a 57 y.o. male admitted on 11/28/2017 with Diabetic foot infection/Wound infection.  Pharmacy has been consulted for Cefepime and Vancomycin  Dosing. Patient received Vancomycin 1g IV x 1 dose and Cefepime 1g IV in ED   Plan: Ke: 0.104    T1/2: 6.7  Vd: 63.7  DW: 91kg Will start Vancomycin 1250 IV every 8 hours, with 6 hour stack dosing.  Calculated trough at Css is 16. Tough level ordered prior to 4th dose.   Start Cefepime 2g IV every 8 hours.  6/2: Vancomycin trough= 13 mcg/ml. MRI= abscess and suspected Osteomyelitis. Will continue current dosing with anticipation that patient will accumulate. Will check another level tomorrow to assess. Patient for possible amputation of toes. Follow renal fxn.    Height: 5\' 10"  (177.8 cm) Weight: 246 lb 11.2 oz (111.9 kg) IBW/kg (Calculated) : 73  Temp (24hrs), Avg:98.3 F (36.8 C), Min:97.9 F (36.6 C), Max:98.6 F (37 C)  Recent Labs  Lab 11/28/17 1934 11/29/17 0549 11/30/17 0756  WBC 10.3 7.5  --   CREATININE 0.82 0.64 0.69  LATICACIDVEN 0.9  --   --   VANCOTROUGH  --   --  13*    Estimated Creatinine Clearance: 129.2 mL/min (by C-G formula based on SCr of 0.69 mg/dL).    Allergies  Allergen Reactions  . Bactrim [Sulfamethoxazole-Trimethoprim] Hives    Antimicrobials this admission: 6/1 cefepime >>  6/1 vancomycin >>   Thank you for allowing pharmacy to be a part of this patient's care.  Noralee Space, PharmD, BCPS Clinical Pharmacist 11/30/2017 10:04 AM

## 2017-11-30 NOTE — Interval H&P Note (Signed)
History and Physical Interval Note:  11/30/2017 9:37 AM  [name]  has presented today for surgery, with the diagnosis of bilateral diabetic foot wounds  The various methods of treatment have been discussed with the patient and family. After consideration of risks, benefits and other options for treatment, the patient has consented to  Procedure(s): DEBRIDEMENT WOUND (Bilateral) as a surgical intervention .  The patient's history has been reviewed, patient examined, no change in status, stable for surgery.  I have reviewed the patient's chart and labs.  Questions were answered to the patient's satisfaction.     Durward Fortes

## 2017-11-30 NOTE — Anesthesia Procedure Notes (Signed)
Procedure Name: LMA Insertion Performed by: Ruhaan Nordahl, CRNA Pre-anesthesia Checklist: Patient identified, Patient being monitored, Timeout performed, Emergency Drugs available and Suction available Patient Re-evaluated:Patient Re-evaluated prior to induction Oxygen Delivery Method: Circle system utilized Preoxygenation: Pre-oxygenation with 100% oxygen Induction Type: IV induction Ventilation: Mask ventilation without difficulty LMA: LMA inserted LMA Size: 4.5 Tube type: Oral Number of attempts: 1 Placement Confirmation: positive ETCO2 and breath sounds checked- equal and bilateral Tube secured with: Tape Dental Injury: Teeth and Oropharynx as per pre-operative assessment        

## 2017-11-30 NOTE — Anesthesia Postprocedure Evaluation (Signed)
Anesthesia Post Note  Patient: [name]  Procedure(s) Performed: DEBRIDEMENT WOUND (Bilateral )  Patient location during evaluation: PACU Anesthesia Type: General Level of consciousness: awake and alert Pain management: pain level controlled Vital Signs Assessment: post-procedure vital signs reviewed and stable Respiratory status: spontaneous breathing, nonlabored ventilation, respiratory function stable and patient connected to nasal cannula oxygen Cardiovascular status: blood pressure returned to baseline and stable Postop Assessment: no apparent nausea or vomiting Anesthetic complications: no     Last Vitals:  Vitals:   11/30/17 1313 11/30/17 1315  BP:  139/79  Pulse: 95 94  Resp: 10 16  Temp:    SpO2: 96% 97%    Last Pain:  Vitals:   11/30/17 1315  TempSrc:   PainSc: 4                  Precious Haws Piscitello

## 2017-11-30 NOTE — Progress Notes (Addendum)
[name] ([id number]) Visit Report for 11/28/2017 Arrival Information Details Patient Name: [name], [name]. Date of Service: 11/28/2017 3:30 PM Medical Record Number: [id number] Patient Account Number: [id number] Date of Birth/Sex: [birth year] (57 y.o. M) Treating RN: Cornell Barman Primary Care Boniface Goffe: Lorelee Market Other Clinician: Referring Kiyonna Tortorelli: Lorelee Market Treating Glynn Yepes/Extender: Melburn Hake, HOYT Weeks in Treatment: 3 Visit Information History Since Last Visit Added or deleted any medications: No Patient Arrived: [name] Any new allergies or adverse reactions: No Arrival Time: 15:47 Had a fall or experienced change in No Accompanied By: self activities of daily living that may affect Transfer Assistance: None risk of falls: Patient Identification Verified: Yes Signs or symptoms of abuse/neglect since last visito No Secondary Verification Process Completed: Yes Hospitalized since last visit: No Patient Requires Transmission-Based Precautions: No Implantable device outside of the clinic excluding No Patient Has Alerts: Yes cellular tissue based products placed in the center Patient Alerts: DM II since last visit: Has Dressing in Place as Prescribed: No Has Footwear/Offloading in Place as Prescribed: Yes Left: Regular Shoe Right: Regular Shoe Pain Present Now: Yes Electronic Signature(s) Signed: 11/28/2017 5:30:19 PM By: Gretta Cool, BSN, RN, CWS, Kim RN, BSN Entered By: Gretta Cool, BSN, RN, CWS, Kim on 11/28/2017 15:48:32 [name] ([id number]) -------------------------------------------------------------------------------- Clinic Level of Care Assessment Details Patient Name: [name]. Date of Service: 11/28/2017 3:30 PM Medical Record Number: [id number] Patient Account Number: [id number] Date of Birth/Sex: May 28, 1961 (57 y.o. M) Treating RN: Montey Hora Primary Care Ineze Serrao: Lorelee Market Other Clinician: Referring Nitesh Pitstick: Lorelee Market Treating Shephanie Romas/Extender: Melburn Hake, HOYT Weeks in Treatment: 3 Clinic Level of Care Assessment Items TOOL 4 Quantity Score []  - Use when only an EandM is performed on FOLLOW-UP visit 0 ASSESSMENTS - Nursing Assessment / Reassessment X - Reassessment of Co-morbidities (includes updates in patient status) 1 10 X- 1 5 Reassessment of Adherence to Treatment Plan ASSESSMENTS - Wound and Skin Assessment / Reassessment []  - Simple Wound Assessment / Reassessment - one wound 0 X- 2 5 Complex Wound Assessment / Reassessment - multiple wounds []  - 0 Dermatologic / Skin Assessment (not related to wound area) ASSESSMENTS - Focused Assessment []  - Circumferential Edema Measurements - multi extremities 0 []  - 0 Nutritional Assessment / Counseling / Intervention X- 1 5 Lower Extremity Assessment (monofilament, tuning fork, pulses) []  - 0 Peripheral Arterial Disease Assessment (using hand held doppler) ASSESSMENTS - Ostomy and/or Continence Assessment and Care []  - Incontinence Assessment and Management 0 []  - 0 Ostomy Care Assessment and Management (repouching, etc.) PROCESS - Coordination of Care X - Simple Patient / Family Education for ongoing care 1 15 []  - 0 Complex (extensive) Patient / Family Education for ongoing care []  - 0 Staff obtains Programmer, systems, Records, Test Results / Process Orders []  - 0 Staff telephones HHA, Nursing Homes / Clarify orders / etc []  - 0 Routine Transfer to another Facility (non-emergent condition) []  - 0 Routine Hospital Admission (non-emergent condition) []  - 0 New Admissions / Biomedical engineer / Ordering NPWT, Apligraf, etc. []  - 0 Emergency Hospital Admission (emergent condition) X- 1 10 Simple Discharge Coordination FREDIE, MAJANO. ([id number]) []  - 0 Complex (extensive) Discharge Coordination PROCESS - Special Needs []  - Pediatric / Minor Patient Management 0 []  - 0 Isolation Patient Management []  - 0 Hearing / Language /  Visual special needs []  - 0 Assessment of Community assistance (transportation, D/C planning, etc.) []  - 0 Additional assistance / Altered mentation []  - 0 Support Surface(s) Assessment (bed,  cushion, seat, etc.) INTERVENTIONS - Wound Cleansing / Measurement []  - Simple Wound Cleansing - one wound 0 X- 2 5 Complex Wound Cleansing - multiple wounds X- 1 5 Wound Imaging (photographs - any number of wounds) []  - 0 Wound Tracing (instead of photographs) []  - 0 Simple Wound Measurement - one wound X- 2 5 Complex Wound Measurement - multiple wounds INTERVENTIONS - Wound Dressings X - Small Wound Dressing one or multiple wounds 2 10 []  - 0 Medium Wound Dressing one or multiple wounds []  - 0 Large Wound Dressing one or multiple wounds []  - 0 Application of Medications - topical []  - 0 Application of Medications - injection INTERVENTIONS - Miscellaneous []  - External ear exam 0 []  - 0 Specimen Collection (cultures, biopsies, blood, body fluids, etc.) []  - 0 Specimen(s) / Culture(s) sent or taken to Lab for analysis []  - 0 Patient Transfer (multiple staff / Civil Service fast streamer / Similar devices) []  - 0 Simple Staple / Suture removal (25 or less) []  - 0 Complex Staple / Suture removal (26 or more) []  - 0 Hypo / Hyperglycemic Management (close monitor of Blood Glucose) []  - 0 Ankle / Brachial Index (ABI) - do not check if billed separately X- 1 5 Vital Signs Dolley, OLLIS DAUDELIN. ([id number]) Has the patient been seen at the hospital within the last three years: Yes Total Score: 105 Level Of Care: New/Established - Level 3 Electronic Signature(s) Signed: 11/28/2017 5:45:37 PM By: Montey Hora Entered By: Montey Hora on 11/28/2017 16:44:10 [name] ([id number]) -------------------------------------------------------------------------------- Encounter Discharge Information Details Patient Name: [name]. Date of Service: 11/28/2017 3:30 PM Medical Record Number:  [id number] Patient Account Number: [id number] Date of Birth/Sex: June 13, 1961 (57 y.o. M) Treating RN: Ahmed Prima Primary Care Natassia Guthridge: Lorelee Market Other Clinician: Referring Rin Gorton: Lorelee Market Treating Neal Trulson/Extender: Melburn Hake, HOYT Weeks in Treatment: 3 Encounter Discharge Information Items Discharge Condition: Stable Ambulatory Status: Ambulatory Discharge Destination: Home Transportation: Private Auto Accompanied By: self Schedule Follow-up Appointment: Yes Clinical Summary of Care: Electronic Signature(s) Signed: 11/28/2017 5:03:55 PM By: Alric Quan Entered By: Alric Quan on 11/28/2017 17:03:55 [name] ([id number]) -------------------------------------------------------------------------------- Lower Extremity Assessment Details Patient Name: [name]. Date of Service: 11/28/2017 3:30 PM Medical Record Number: [id number] Patient Account Number: [id number] Date of Birth/Sex: 1960/11/11 (57 y.o. M) Treating RN: Cornell Barman Primary Care Kiylee Thoreson: Lorelee Market Other Clinician: Referring Jennie Bolar: Lorelee Market Treating Dennise Bamber/Extender: Melburn Hake, HOYT Weeks in Treatment: 3 Edema Assessment Assessed: [Left: No] [Right: No] [Left: Edema] [Right: :] Calf Left: Right: Point of Measurement: 36 cm From Medial Instep 39.5 cm 36.8 cm Ankle Left: Right: Point of Measurement: 11 cm From Medial Instep 26 cm 25.5 cm Vascular Assessment Pulses: Dorsalis Pedis Palpable: [Left:Yes] [Right:Yes] Posterior Tibial Extremity colors, hair growth, and conditions: Extremity Color: [Left:Hyperpigmented] [Right:Hyperpigmented] Hair Growth on Extremity: [Left:No] [Right:No] Temperature of Extremity: [Left:Warm] [Right:Warm] Capillary Refill: [Left:< 3 seconds] [Right:< 3 seconds] Toe Nail Assessment Left: Right: Thick: Yes Yes Discolored: No No Deformed: No No Improper Length and Hygiene: No No Notes right great toe partial  amputated Electronic Signature(s) Signed: 11/28/2017 5:30:19 PM By: Gretta Cool, BSN, RN, CWS, Kim RN, BSN Entered By: Gretta Cool, BSN, RN, CWS, Kim on 11/28/2017 16:03:00 [name] ([id number]) -------------------------------------------------------------------------------- Multi Wound Chart Details Patient Name: [name]. Date of Service: 11/28/2017 3:30 PM Medical Record Number: [id number] Patient Account Number: [id number] Date of Birth/Sex: [birth year] (57 y.o. M) Treating RN: Montey Hora Primary Care Tionne Dayhoff: Lorelee Market Other Clinician: Referring Gus Littler:  Brunetta Genera, Meindert Treating Ameliyah Sarno/Extender: STONE III, HOYT Weeks in Treatment: 3 Vital Signs Height(in): 70 Pulse(bpm): 98 Weight(lbs): 265 Blood Pressure(mmHg): 160/72 Body Mass Index(BMI): 38 Temperature(F): 98.3 Respiratory Rate 16 (breaths/min): Photos: [N/A:N/A] Wound Location: Left Foot - Lateral Right Metatarsal head first - N/A Plantar Wounding Event: Gradually Appeared Gradually Appeared N/A Primary Etiology: Diabetic Wound/Ulcer of the Diabetic Wound/Ulcer of the N/A Lower Extremity Lower Extremity Comorbid History: Hypertension, Type II Hypertension, Type II N/A Diabetes, Neuropathy Diabetes, Neuropathy Date Acquired: 10/24/2017 10/24/2017 N/A Weeks of Treatment: 3 3 N/A Wound Status: Open Open N/A Measurements L x W x D 0.6x1.4x0.4 1.5x2.5x0.9 N/A (cm) Area (cm) : 0.66 2.945 N/A Volume (cm) : 0.264 2.651 N/A % Reduction in Area: 29.90% -6166.00% N/A % Reduction in Volume: -40.40% -18835.70% N/A Starting Position 1 12 12  (o'clock): Ending Position 1 12 2  (o'clock): Maximum Distance 1 (cm): 0.9 1.2 Undermining: Yes Yes N/A Classification: Grade 2 Grade 2 N/A Exudate Amount: Large Medium N/A Exudate Type: Serosanguineous Serous N/A Exudate Color: red, brown amber N/A Wound Margin: Distinct, outline attached Distinct, outline attached N/A Granulation Amount: Large (67-100%)  Medium (34-66%) N/A Granulation Quality: Red Red, Pink N/A WILLOW, RECZEK ([id number]) Necrotic Amount: Small (1-33%) Medium (34-66%) N/A Necrotic Tissue: Eschar, Adherent Slough Eschar, Adherent Slough N/A Exposed Structures: Fat Layer (Subcutaneous Fat Layer (Subcutaneous N/A Tissue) Exposed: Yes Tissue) Exposed: Yes Fascia: No Fascia: No Tendon: No Tendon: No Muscle: No Muscle: No Joint: No Joint: No Bone: No Bone: No Epithelialization: None None N/A Periwound Skin Texture: Callus: Yes Callus: Yes N/A Excoriation: No Excoriation: No Induration: No Induration: No Crepitus: No Crepitus: No Rash: No Rash: No Scarring: No Scarring: No Periwound Skin Moisture: Maceration: No Maceration: Yes N/A Dry/Scaly: No Dry/Scaly: No Periwound Skin Color: Atrophie Blanche: No Atrophie Blanche: No N/A Cyanosis: No Cyanosis: No Ecchymosis: No Ecchymosis: No Erythema: No Erythema: No Hemosiderin Staining: No Hemosiderin Staining: No Mottled: No Mottled: No Pallor: No Pallor: No Rubor: No Rubor: No Temperature: No Abnormality No Abnormality N/A Tenderness on Palpation: Yes Yes N/A Wound Preparation: Ulcer Cleansing: Ulcer Cleansing: N/A Rinsed/Irrigated with Saline Rinsed/Irrigated with Saline Topical Anesthetic Applied: Topical Anesthetic Applied: Other: lidocaine 4% Other: lidocaine 4% Treatment Notes Electronic Signature(s) Signed: 11/28/2017 5:45:37 PM By: Montey Hora Entered By: Montey Hora on 11/28/2017 16:39:32 [name] ([id number]) -------------------------------------------------------------------------------- [place] Details Patient Name: JAHSON, EMANUELE. Date of Service: 11/28/2017 3:30 PM Medical Record Number: [id number] Patient Account Number: [id number] Date of Birth/Sex: 09/01/1960 (57 y.o. M) Treating RN: Montey Hora Primary Care Shannon Kirkendall: Lorelee Market Other Clinician: Referring Perfecto Purdy: Lorelee Market Treating Cyndie Woodbeck/Extender: Melburn Hake, HOYT Weeks in Treatment: 3 Active Inactive Electronic Signature(s) Signed: 12/09/2017 1:40:53 PM By: Gretta Cool, BSN, RN, CWS, Kim RN, BSN Signed: 12/18/2017 10:39:50 AM By: Montey Hora Previous Signature: 11/28/2017 5:45:37 PM Version By: Montey Hora Entered By: Gretta Cool BSN, RN, CWS, Kim on 12/09/2017 13:40:52 HELMUTH, RECUPERO ([id number]) -------------------------------------------------------------------------------- Pain Assessment Details Patient Name: WALDO, DAMIAN. Date of Service: 11/28/2017 3:30 PM Medical Record Number: [id number] Patient Account Number: [id number] Date of Birth/Sex: [birth year] (57 y.o. M) Treating RN: Cornell Barman Primary Care Keiasha Diep: Lorelee Market Other Clinician: Referring Asbury Hair: Lorelee Market Treating Analynn Daum/Extender: Melburn Hake, HOYT Weeks in Treatment: 3 Active Problems Location of Pain Severity and Description of Pain Patient Has Paino Yes Site Locations Pain Location: Pain in Ulcers With Dressing Change: Yes Rate the pain. Current Pain Level: 10 Character of Pain Describe the Pain: Burning, Tender, Throbbing Pain Management and Medication  Current Pain Management: Goals for Pain Management Topical or injectable lidocaine is offered to patient for acute pain when surgical debridement is performed. If needed, Patient is instructed to use over the counter pain medication for the following 24-48 hours after debridement. Wound care MDs do not prescribed pain medications. Patient has chronic pain or uncontrolled pain. Patient has been instructed to make an appointment with their Primary Care Physician for pain management. Electronic Signature(s) Signed: 11/28/2017 5:30:19 PM By: Gretta Cool, BSN, RN, CWS, Kim RN, BSN Entered By: Gretta Cool, BSN, RN, CWS, Kim on 11/28/2017 15:48:54 [name]  ([id number]) -------------------------------------------------------------------------------- Patient/Caregiver Education Details Patient Name: CAIDON, FOTI. Date of Service: 11/28/2017 3:30 PM Medical Record Number: [id number] Patient Account Number: [id number] Date of Birth/Gender: Dec 05, 1960 (57 y.o. M) Treating RN: Ahmed Prima Primary Care Physician: Lorelee Market Other Clinician: Referring Physician: Lorelee Market Treating Physician/Extender: Sharalyn Ink in Treatment: 3 Education Assessment Education Provided To: Patient Education Topics Provided Wound/Skin Impairment: Handouts: Caring for Your Ulcer, Skin Care Do's and Dont's, Other: change dressing as ordered Methods: Demonstration, Explain/Verbal Responses: State content correctly Electronic Signature(s) Signed: 11/28/2017 5:38:20 PM By: Alric Quan Entered By: Alric Quan on 11/28/2017 17:04:17 [name] ([id number]) -------------------------------------------------------------------------------- Wound Assessment Details Patient Name: [name]. Date of Service: 11/28/2017 3:30 PM Medical Record Number: [id number] Patient Account Number: [id number] Date of Birth/Sex: 03/02/1961 (57 y.o. M) Treating RN: Cornell Barman Primary Care Destynie Toomey: Lorelee Market Other Clinician: Referring Briceyda Abdullah: Lorelee Market Treating Melton Walls/Extender: Melburn Hake, HOYT Weeks in Treatment: 3 Wound Status Wound Number: 12 Primary Etiology: Diabetic Wound/Ulcer of the Lower Extremity Wound Location: Left Foot - Lateral Wound Status: Open Wounding Event: Gradually Appeared Comorbid Hypertension, Type II Diabetes, Date Acquired: 10/24/2017 History: Neuropathy Weeks Of Treatment: 3 Clustered Wound: No Photos Photo Uploaded By: Gretta Cool, BSN, RN, CWS, Kim on 11/28/2017 16:35:58 Wound Measurements Length: (cm) 0.6 % Reducti Width: (cm) 1.4 % Reducti Depth: (cm) 0.4 Epithelia Area: (cm)  0.66 Tunnelin Volume: (cm) 0.264 Undermin Starti Ending Maximu on in Area: 29.9% on in Volume: -40.4% lization: None g: No ing: Yes ng Position (o'clock): 12 Position (o'clock): 12 m Distance: (cm) 0.9 Wound Description Classification: Grade 2 Foul Odor Wound Margin: Distinct, outline attached Slough/Fi Exudate Amount: Large Exudate Type: Serosanguineous Exudate Color: red, brown After Cleansing: No brino Yes Wound Bed Granulation Amount: Large (67-100%) Exposed Structure Granulation Quality: Red Fascia Exposed: No Necrotic Amount: Small (1-33%) Fat Layer (Subcutaneous Tissue) Exposed: Yes Necrotic Quality: Eschar, Adherent Slough Tendon Exposed: No Muscle Exposed: No ANDREA, COLGLAZIER. ([id number]) Joint Exposed: No Bone Exposed: No Periwound Skin Texture Texture Color No Abnormalities Noted: No No Abnormalities Noted: No Callus: Yes Atrophie Blanche: No Crepitus: No Cyanosis: No Excoriation: No Ecchymosis: No Induration: No Erythema: No Rash: No Hemosiderin Staining: No Scarring: No Mottled: No Pallor: No Moisture Rubor: No No Abnormalities Noted: No Dry / Scaly: No Temperature / Pain Maceration: No Temperature: No Abnormality Tenderness on Palpation: Yes Wound Preparation Ulcer Cleansing: Rinsed/Irrigated with Saline Topical Anesthetic Applied: Other: lidocaine 4%, Electronic Signature(s) Signed: 11/28/2017 5:30:19 PM By: Gretta Cool, BSN, RN, CWS, Kim RN, BSN Entered By: Gretta Cool, BSN, RN, CWS, Kim on 11/28/2017 15:59:01 [name] ([id number]) -------------------------------------------------------------------------------- Wound Assessment Details Patient Name: ELYA, DILORETO. Date of Service: 11/28/2017 3:30 PM Medical Record Number: [id number] Patient Account Number: [id number] Date of Birth/Sex: 01-Jun-1961 (57 y.o. M) Treating RN: Cornell Barman Primary Care Syed Zukas: Lorelee Market Other Clinician: Referring Noeli Lavery: Lorelee Market Treating Tudor Chandley/Extender: Melburn Hake, HOYT  Weeks in Treatment: 3 Wound Status Wound Number: 13 Primary Etiology: Diabetic Wound/Ulcer of the Lower Extremity Wound Location: Right Metatarsal head first - Plantar Wound Status: Open Wounding Event: Gradually Appeared Comorbid Hypertension, Type II Diabetes, Date Acquired: 10/24/2017 History: Neuropathy Weeks Of Treatment: 3 Clustered Wound: No Photos Photo Uploaded By: Gretta Cool, BSN, RN, CWS, Kim on 11/28/2017 16:35:58 Wound Measurements Length: (cm) 1.5 Width: (cm) 2.5 Depth: (cm) 0.9 Area: (cm) 2.945 Volume: (cm) 2.651 % Reduction in Area: -6166% % Reduction in Volume: -18835.7% Epithelialization: None Tunneling: No Undermining: Yes Starting Position (o'clock): 12 Ending Position (o'clock): 2 Maximum Distance: (cm) 1.2 Wound Description Classification: Grade 2 Wound Margin: Distinct, outline attached Exudate Amount: Medium Exudate Type: Serous Exudate Color: amber Foul Odor After Cleansing: No Slough/Fibrino Yes Wound Bed Granulation Amount: Medium (34-66%) Exposed Structure Granulation Quality: Red, Pink Fascia Exposed: No Necrotic Amount: Medium (34-66%) Fat Layer (Subcutaneous Tissue) Exposed: Yes Necrotic Quality: Eschar, Adherent Slough Tendon Exposed: No Muscle Exposed: No UCHECHUKWU, DHAWAN ([id number]) Joint Exposed: No Bone Exposed: No Periwound Skin Texture Texture Color No Abnormalities Noted: No No Abnormalities Noted: No Callus: Yes Atrophie Blanche: No Crepitus: No Cyanosis: No Excoriation: No Ecchymosis: No Induration: No Erythema: No Rash: No Hemosiderin Staining: No Scarring: No Mottled: No Pallor: No Moisture Rubor: No No Abnormalities Noted: No Dry / Scaly: No Temperature / Pain Maceration: Yes Temperature: No Abnormality Tenderness on Palpation: Yes Wound Preparation Ulcer Cleansing: Rinsed/Irrigated with Saline Topical Anesthetic Applied: Other: lidocaine  4%, Electronic Signature(s) Signed: 11/28/2017 5:30:19 PM By: Gretta Cool, BSN, RN, CWS, Kim RN, BSN Entered By: Gretta Cool, BSN, RN, CWS, Kim on 11/28/2017 15:59:14 [name] ([id number]) -------------------------------------------------------------------------------- Vitals Details Patient Name: JES, COSTALES. Date of Service: 11/28/2017 3:30 PM Medical Record Number: [id number] Patient Account Number: [id number] Date of Birth/Sex: October 27, 1960 (57 y.o. M) Treating RN: Cornell Barman Primary Care Stepfanie Yott: Lorelee Market Other Clinician: Referring Kaysha Parsell: Lorelee Market Treating Elliana Bal/Extender: Melburn Hake, HOYT Weeks in Treatment: 3 Vital Signs Time Taken: 15:48 Temperature (F): 98.3 Height (in): 70 Pulse (bpm): 98 Weight (lbs): 265 Respiratory Rate (breaths/min): 16 Body Mass Index (BMI): 38 Blood Pressure (mmHg): 160/72 Reference Range: 80 - 120 mg / dl Notes Patient has not taken his blood pressure meds today. Electronic Signature(s) Signed: 11/28/2017 5:30:19 PM By: Gretta Cool, BSN, RN, CWS, Kim RN, BSN Entered By: Gretta Cool, BSN, RN, CWS, Kim on 11/28/2017 15:49:38

## 2017-12-01 ENCOUNTER — Encounter: Payer: Self-pay | Admitting: Podiatry

## 2017-12-01 ENCOUNTER — Encounter
Admission: EM | Disposition: A | Discharge status: Skilled Nursing Facility | Payer: Self-pay | Source: Ambulatory Visit | Attending: Internal Medicine

## 2017-12-01 DIAGNOSIS — I70248 Atherosclerosis of native arteries of left leg with ulceration of other part of lower left leg: Secondary | ICD-10-CM

## 2017-12-01 HISTORY — PX: LOWER EXTREMITY ANGIOGRAPHY: CATH118251

## 2017-12-01 LAB — GLUCOSE, CAPILLARY
GLUCOSE-CAPILLARY: 139 mg/dL — AB (ref 65–99)
Glucose-Capillary: 95 mg/dL (ref 65–99)
Glucose-Capillary: 69 mg/dL (ref 65–99)
Glucose-Capillary: 161 mg/dL — ABNORMAL HIGH (ref 65–99)

## 2017-12-01 LAB — VANCOMYCIN, TROUGH: VANCOMYCIN TR: 13 ug/mL — AB (ref 15–20)

## 2017-12-01 SURGERY — LOWER EXTREMITY ANGIOGRAPHY
Anesthesia: Moderate Sedation

## 2017-12-01 MED ORDER — FENTANYL CITRATE (PF) 100 MCG/2ML IJ SOLN
INTRAMUSCULAR | Status: AC
  Filled 2017-12-01: qty 2

## 2017-12-01 MED ORDER — MIDAZOLAM HCL 2 MG/2ML IJ SOLN
INTRAMUSCULAR | Status: AC
  Filled 2017-12-01: qty 4

## 2017-12-01 MED ORDER — CLOPIDOGREL BISULFATE 75 MG PO TABS
75.0000 mg | ORAL_TABLET | Freq: Every day | ORAL | Status: DC
  Administered 2017-12-01 – 2017-12-05 (×4): 75 mg via ORAL
  Filled 2017-12-01 (×4): qty 1

## 2017-12-01 MED ORDER — IOPAMIDOL (ISOVUE-300) INJECTION 61%
INTRAVENOUS | Status: DC | PRN
  Administered 2017-12-01: 70 mL via INTRAVENOUS

## 2017-12-01 MED ORDER — LIDOCAINE-EPINEPHRINE (PF) 1 %-1:200000 IJ SOLN
INTRAMUSCULAR | Status: AC
  Filled 2017-12-01: qty 30

## 2017-12-01 MED ORDER — HEPARIN SODIUM (PORCINE) 1000 UNIT/ML IJ SOLN
INTRAMUSCULAR | Status: DC | PRN
  Administered 2017-12-01: 5000 [IU] via INTRAVENOUS

## 2017-12-01 MED ORDER — HEPARIN SODIUM (PORCINE) 1000 UNIT/ML IJ SOLN
INTRAMUSCULAR | Status: AC
  Filled 2017-12-01: qty 1

## 2017-12-01 MED ORDER — VANCOMYCIN HCL 10 G IV SOLR
1500.0000 mg | Freq: Three times a day (TID) | INTRAVENOUS | Status: DC
  Administered 2017-12-02 (×2): 1500 mg via INTRAVENOUS
  Filled 2017-12-01 (×4): qty 1500

## 2017-12-01 MED ORDER — MIDAZOLAM HCL 2 MG/2ML IJ SOLN
INTRAMUSCULAR | Status: DC | PRN
  Administered 2017-12-01: 1 mg via INTRAVENOUS
  Administered 2017-12-01 (×2): 2 mg via INTRAVENOUS
  Administered 2017-12-01: 1 mg via INTRAVENOUS

## 2017-12-01 MED ORDER — LIDOCAINE-EPINEPHRINE (PF) 1 %-1:200000 IJ SOLN
INTRAMUSCULAR | Status: DC | PRN
  Administered 2017-12-01: 5 mL

## 2017-12-01 MED ORDER — SODIUM CHLORIDE 0.9 % IV SOLN
INTRAVENOUS | Status: DC
  Administered 2017-12-01: 11:00:00 via INTRAVENOUS

## 2017-12-01 MED ORDER — MIDAZOLAM HCL 2 MG/2ML IJ SOLN
INTRAMUSCULAR | Status: AC
  Filled 2017-12-01: qty 2

## 2017-12-01 MED ORDER — CEFAZOLIN SODIUM-DEXTROSE 2-4 GM/100ML-% IV SOLN
2.0000 g | INTRAVENOUS | Status: AC
  Administered 2017-12-01: 2 g via INTRAVENOUS
  Filled 2017-12-01 (×2): qty 100

## 2017-12-01 MED ORDER — FENTANYL CITRATE (PF) 100 MCG/2ML IJ SOLN
INTRAMUSCULAR | Status: DC | PRN
  Administered 2017-12-01 (×4): 50 ug via INTRAVENOUS

## 2017-12-01 SURGICAL SUPPLY — 18 items
BALLN ULTRVRSE 3X300X150 (BALLOONS) ×1
BALLN ULTRVRSE 3X300X150 OTW (BALLOONS) ×1
BALLOON ULTRVRSE 3X300X150 OTW (BALLOONS) ×1 IMPLANT
CATH BEACON 5 .038 100 VERT TP (CATHETERS) ×2 IMPLANT
CATH CXI SUPP ANG 4FR 135 (CATHETERS) ×1 IMPLANT
CATH CXI SUPP ANG 4FR 135CM (CATHETERS) ×2
CATH PIG 70CM (CATHETERS) ×2 IMPLANT
COVER PROBE U/S 5X48 (MISCELLANEOUS) ×2 IMPLANT
DEVICE PRESTO INFLATION (MISCELLANEOUS) ×2 IMPLANT
DEVICE STARCLOSE SE CLOSURE (Vascular Products) ×2 IMPLANT
PACK ANGIOGRAPHY (CUSTOM PROCEDURE TRAY) ×2 IMPLANT
SHEATH BRITE TIP 5FRX11 (SHEATH) ×6 IMPLANT
SHEATH RAABE 6FRX70 (SHEATH) ×2 IMPLANT
TOWEL OR 17X26 4PK STRL BLUE (TOWEL DISPOSABLE) ×2 IMPLANT
TUBING CONTRAST HIGH PRESS 72 (TUBING) ×2 IMPLANT
WIRE G V18X300CM (WIRE) ×2 IMPLANT
WIRE J 3MM .035X145CM (WIRE) ×2 IMPLANT
WIRE MAGIC TORQUE 260C (WIRE) ×2 IMPLANT

## 2017-12-01 NOTE — Progress Notes (Signed)
Inpatient Diabetes Program Recommendations  AACE/ADA: New Consensus Statement on Inpatient Glycemic Control (2015)  Target Ranges:  Prepandial:   less than 140 mg/dL      Peak postprandial:   less than 180 mg/dL (1-2 hours)      Critically ill patients:  140 - 180 mg/dL   Lab Results  Component Value Date   GLUCAP 139 (H) 12/01/2017   HGBA1C 8.8 (H) 11/29/2017    Review of Glycemic ControlResults for EMMITTE, SURGEON (MRN [id number]) as of 12/01/2017 11:53  Ref. Range 11/30/2017 18:26 11/30/2017 21:02 12/01/2017 07:38  Glucose-Capillary Latest Ref Range: 65 - 99 mg/dL 252 (H) 251 (H) 139 (H)   Diabetes history: Type 2 DM  Outpatient Diabetes medications:  Novolog 12-20 units tid with meals, Basaglar 35 units q HS Current orders for Inpatient glycemic control:  Novolog moderate tid with meals and HS, Lantus 35 units q HS-(currently on hold for procedure) Inpatient Diabetes Program Recommendations:    Please consider adding Novolog 6 units tid with meals (hold if patient eats less than 50%) once diet resumed.  Thanks,  Adah Perl, RN, BC-ADM Inpatient Diabetes Coordinator Pager [phone] (8a-5p)

## 2017-12-01 NOTE — Progress Notes (Signed)
Pt is resting in room now. Femoral site intact. Denies pain. VSS.

## 2017-12-01 NOTE — Progress Notes (Addendum)
[facility] [place] NAME: [name]    MR#:  [id number]  DATE OF BIRTH:  August 05, 1960  SUBJECTIVE: Patient admitted for diabetic foot infection of both feet, angiogram ofboth legs with  intervention  CHIEF COMPLAINT:   Chief Complaint  Patient presents with  . Wound Infection   Patient is scheduled for fifth ray amputation especially on the left foot by podiatry  Patient denies any complaints but worried about postoperative recovery.  REVIEW OF SYSTEMS:   ROS CONSTITUTIONAL: No fever, fatigue or weakness.  EYES: No blurred or double vision.  EARS, NOSE, AND THROAT: No tinnitus or ear pain.  RESPIRATORY: No cough, shortness of breath, wheezing or hemoptysis.  CARDIOVASCULAR: No chest pain, orthopnea, edema.  GASTROINTESTINAL: No nausea, vomiting, diarrhea or abdominal pain.  GENITOURINARY: No dysuria, hematuria.  ENDOCRINE: No polyuria, nocturia,  HEMATOLOGY: No anemia, easy bruising or bleeding SKIN: No rash or lesion. MUSCULOSKELETAL:Dressig present for both legs NEUROLOGIC: No tingling, numbness, weakness.  PSYCHIATRY: No anxiety or depression.   DRUG ALLERGIES:   Allergies  Allergen Reactions  . Bactrim [Sulfamethoxazole-Trimethoprim] Hives    VITALS:  Blood pressure (!) 146/76, pulse 92, temperature 97.9 F (36.6 C), resp. rate 18, height 5\' 10"  (1.778 m), weight 111.6 kg (246 lb), SpO2 98 %.  PHYSICAL EXAMINATION:  GENERAL:  57 y.o.-year-old patient lying in the bed with no acute distress.  EYES: Pupils equal, round, reactive to light and accommodation. No scleral icterus. Extraocular muscles intact.  HEENT: Head atraumatic, normocephalic. Oropharynx and nasopharynx clear.  NECK:  Supple, no jugular venous distention. No thyroid enlargement, no tenderness.  LUNGS: Normal breath sounds bilaterally, no wheezing, rales,rhonchi or crepitation. No use of accessory muscles of respiration.  CARDIOVASCULAR: S1, S2  normal. No murmurs, rubs, or gallops.  ABDOMEN: Soft, nontender, nondistended. Bowel sounds present. No organomegaly or mass.  EXTREMITIES: Dressing present for both  nEUROLOGIC: Cranial nerves II through XII are intact. Muscle strength 5/5 in all extremities. Sensation intact. Gait not checked.  PSYCHIATRIC: The patient is alert and oriented x 3.  SKIN: No obvious rash, lesion, or ulcer.    LABORATORY PANEL:   CBC Recent Labs  Lab 11/29/17 0549  WBC 7.5  HGB 12.8*  HCT 37.1*  PLT 210   ------------------------------------------------------------------------------------------------------------------  Chemistries  Recent Labs  Lab 11/28/17 1934 11/29/17 0549 11/30/17 0756  NA 134* 136  --   K 3.6 3.4*  --   CL 97* 102  --   CO2 27 25  --   GLUCOSE 196* 109*  --   BUN 15 10  --   CREATININE 0.82 0.64 0.69  CALCIUM 8.9 8.4*  --   AST 17  --   --   ALT 15*  --   --   ALKPHOS 105  --   --   BILITOT 0.5  --   --    ------------------------------------------------------------------------------------------------------------------  Cardiac Enzymes No results for input(s): TROPONINI in the last 168 hours. ------------------------------------------------------------------------------------------------------------------  RADIOLOGY:  No results found.  EKG:   Orders placed or performed during the hospital encounter of 06/08/15  . EKG 12 lead  . EKG 12 lead    ASSESSMENT AND PLAN:   Acute osteomyelitis of both  Right and left 5th  metatarsal,s/p right 5th toe amputation, debridement of left 5 th toe.podiatry discussed with patient, patient wife extensively about postoperative recovery, walking and ability to get around in future.    Bilateral foot cellulitis, patient went  for right fifth metatarsal amputation, left foot debridement by podiatry yesterday, continue vancomycin, cefepime and see how he does.wound cultures showed streptococci and morganella;final S are  pending.will request ID consult  #2. PVD, nonpalpable pedal pulse and chronic diabetic foot ulcers recommend bilateral leg angiogram today with angioplasty,started o #3 .diabetes mellitus type 2: Patient is on Lantus, sliding scale insulin with coverage.hba1c 8.8.uncontrolled.   All the records are reviewed and case discussed with Care Management/Social Workerr. Management plans discussed with the patient, family and they are in agreement.  CODE STATUS: Full code  TOTAL TIME TAKING CARE OF THIS PATIENT: 35 minutes.   POSSIBLE D/C IN 1-2 DAYS, DEPENDING ON CLINICAL CONDITION.   Epifanio Lesches M.D on 12/01/2017 at 6:09 PM  Between 7am to 6pm - Pager - [phone]  After 6pm go to [email or web address] - password [email or web address] [facility]  Office  [phone]  CC: Primary care physician; Lorelee Market, MD   Note: This dictation was prepared with Dragon dictation along with smaller phrase technology. Any transcriptional errors that result from this process are unintentional.

## 2017-12-01 NOTE — H&P (Signed)
 [facility] History & Physical Update  The patient was interviewed and re-examined.  The patient's previous History and Physical has been reviewed and is unchanged.  There is no change in the plan of care. We plan to proceed with the scheduled procedure.  Leotis Pain, MD  12/01/2017, 11:55 AM

## 2017-12-01 NOTE — Progress Notes (Signed)
Day of Surgery   Subjective/Chief Complaint: Patient was seen in the specials waiting area waiting for his vascular evaluation.  Some pain overnight but manageable.    Objective: Vital signs in last 24 hours: Temp:  [97.8 F (36.6 C)-98.4 F (36.9 C)] 97.8 F (36.6 C) (06/03 0738) Pulse Rate:  [79-100] 91 (06/03 1029) Resp:  [15-22] 22 (06/03 1048) BP: (134-160)/(63-92) 157/90 (06/03 1048) SpO2:  [93 %-100 %] 97 % (06/03 1048) Weight:  [111.6 kg (246 lb)] 111.6 kg (246 lb) (06/03 1029) Last BM Date: 11/28/17  Intake/Output from previous day: 06/02 0701 - 06/03 0700 In: 400 [I.V.:400] Out: 2690 [Urine:2690] Intake/Output this shift: Total I/O In: 250 [IV Piggyback:250] Out: 1450 [Urine:1450]  The bandages on both feet are dry and intact with no evidence of any strikethrough on the bandaging.  Lab Results:  Recent Labs    11/28/17 1934 11/29/17 0549  WBC 10.3 7.5  HGB 14.0 12.8*  HCT 40.5 37.1*  PLT 242 210   BMET Recent Labs    11/28/17 1934 11/29/17 0549 11/30/17 0756  NA 134* 136  --   K 3.6 3.4*  --   CL 97* 102  --   CO2 27 25  --   GLUCOSE 196* 109*  --   BUN 15 10  --   CREATININE 0.82 0.64 0.69  CALCIUM 8.9 8.4*  --    PT/INR No results for input(s): LABPROT, INR in the last 72 hours. ABG No results for input(s): PHART, HCO3 in the last 72 hours.  Invalid input(s): PCO2, PO2  Studies/Results: No results found.  Anti-infectives: Anti-infectives (From admission, onward)   Start     Dose/Rate Route Frequency Ordered Stop   12/01/17 0827  ceFAZolin (ANCEF) IVPB 2g/100 mL premix     2 g 200 mL/hr over 30 Minutes Intravenous 30 min pre-op 12/01/17 0827     11/29/17 0800  [MAR Hold]  vancomycin (VANCOCIN) 1,250 mg in sodium chloride 0.9 % 250 mL IVPB     (MAR Hold since Mon 12/01/2017 at 1051. Reason: Transfer to a Procedural area.)   1,250 mg 166.7 mL/hr over 90 Minutes Intravenous Every 8 hours 11/29/17 0135     11/29/17 0600  [MAR Hold]   ceFEPIme (MAXIPIME) 2 g in sodium chloride 0.9 % 100 mL IVPB     (MAR Hold since Mon 12/01/2017 at 1051. Reason: Transfer to a Procedural area.)   2 g 200 mL/hr over 30 Minutes Intravenous Every 8 hours 11/29/17 0136     11/29/17 0100  vancomycin (VANCOCIN) IVPB 1000 mg/200 mL premix     1,000 mg 200 mL/hr over 60 Minutes Intravenous STAT 11/29/17 0050 11/29/17 0220   11/28/17 2345  clindamycin (CLEOCIN) IVPB 900 mg  Status:  Discontinued     900 mg 100 mL/hr over 30 Minutes Intravenous  Once 11/28/17 2336 11/28/17 2342   11/28/17 2345  ceFEPIme (MAXIPIME) 1 g in sodium chloride 0.9 % 100 mL IVPB     1 g 200 mL/hr over 30 Minutes Intravenous  Once 11/28/17 2342 11/29/17 0111      Assessment/Plan: s/p Procedure(s): Lower Extremity Angiography (N/A) Assessment: Stable status post debridement bone bilateral.   Plan: Dressings left intact.  Plan for dressing change tomorrow for evaluation of the wounds.  LOS: 2 days    Durward Fortes 12/01/2017

## 2017-12-01 NOTE — Op Note (Signed)
[facility]  Percutaneous Study/Intervention Procedural Note   Date of Surgery: 12/01/2017  Surgeon(s):Ilhan Debenedetto    Assistants:none  Pre-operative Diagnosis: PAD with ulceration and infection bilateral lower extremities  Post-operative diagnosis:  Same  Procedure(s) Performed:             1.  Ultrasound guidance for vascular access right femoral artery             2.  Catheter placement into left posterior tibial artery and left anterior tibial artery from right femoral approach             3.  Aortogram and selective bilateral lower extremity angiograms             4.  Percutaneous transluminal angioplasty of left anterior tibial artery with 3 mm diameter by 30 cm length angioplasty balloon             5.   Percutaneous transluminal angioplasty of the left posterior tibial artery with 3 mm diameter by 30 cm length angioplasty balloon  6.  StarClose closure device right femoral artery  EBL: 15 cc  Contrast: 70 cc  Fluoro Time: 5.7 minutes  Moderate Conscious Sedation Time: approximately 45 minutes using 6 mg of Versed and 200 Mcg of Fentanyl              Indications:  Patient is a 57 y.o.male with infection and ulceration of both lower extremities.  He had poorly palpable pedal pulses and has already undergone bilateral lower extremity debridement by podiatry. The patient is brought in for angiography for further evaluation and potential treatment.  Due to the limb threatening nature of the situation, angiogram was performed for attempted limb salvage. The patient is aware that if the procedure fails, amputation would be expected.  The patient also understands that even with successful revascularization, amputation may still be required due to the severity of the situation. Risks and benefits are discussed and informed consent is obtained.   Procedure:  The patient was identified and appropriate procedural time out was performed.  The patient was then placed  supine on the table and prepped and draped in the usual sterile fashion. Moderate conscious sedation was administered during a face to face encounter with the patient throughout the procedure with my supervision of the RN administering medicines and monitoring the patient's vital signs, pulse oximetry, telemetry and mental status throughout from the start of the procedure until the patient was taken to the recovery room. Ultrasound was used to evaluate the right common femoral artery.  It was patent .  A digital ultrasound image was acquired.  A Seldinger needle was used to access the right common femoral artery under direct ultrasound guidance and a permanent image was performed.  A 0.035 J wire was advanced without resistance and a 5Fr sheath was placed.  Pigtail catheter was placed into the aorta and an AP aortogram was performed. This demonstrated normal renal arteries and normal aorta and iliac segments without significant stenosis. I then crossed the aortic bifurcation and advanced to the left femoral head.  Due to a slow circulation time, the pigtail catheter was advanced to the mid superficial femoral artery for distal opacification to be improved.  Selective left lower extremity angiogram was then performed. This demonstrated normal common femoral artery with a significantly diseased and pruned profunda femoris artery.  Superficial femoral artery and popliteal arteries had good flow with no greater than 25% stenosis.  There is a normal tibial trifurcation.  The anterior tibial artery had multiple short segment greater than 80% stenosis throughout its course in the proximal, mid, and distal anterior tibial artery.  The peroneal artery was continuous although it terminated at the ankle.  The posterior tibial artery had about a 70% stenosis at the proximal segment and about a 90% stenosis in the mid segment.  The diagnostic catheter was removed and a Magic torque wire was placed into the left SFA.  Imaging of  the right lower extremity was then performed to help plan the need for right lower extremity intervention in the future.  Again, a good common femoral artery with a small diseased profunda femoris artery was seen.  The superficial femoral and popliteal arteries had no greater than 20% stenosis identified.  The tibials did not opacify as well with the retrograde sheath in the right femoral, but once again there was a diffusely diseased anterior tibial artery although it was continuous distally.  The peroneal artery seem to not have focal stenoses but terminate at the ankle.  The posterior tibial artery seem to have greater than 70% stenosis in the mid segment.  This was nearly mirror-image disease to the left leg.  At this point I elected to proceed with intervention on the left lower extremity.  The patient was systemically heparinized and a 6 French 70 cm sheath was then placed over the Magic torque wire. I then used a Kumpe catheter and a V 18 wire to first navigate into the left anterior tibial artery.  I then exchanged for a CXI catheter and cross the stenoses confirming intraluminal flow in the distal anterior tibial artery.  The wire was then replaced.  A 3 mm diameter by 30 cm length angioplasty balloon was inflated from the distal anterior tibial artery just above the ankle up to near the origin of the anterior tibial artery.  This was taken to 12 atm for 1 minute.  Completion angiogram showed only 20 to 30% residual stenosis with more brisk flow into the foot.  I then turned my attention to the posterior tibial artery.  Using the Kumpe catheter I was able to cannulate the left posterior tibial artery with a V 18 wire without difficulty and cross the stenoses both proximally and in the mid segment of the left posterior tibial artery.  I then remove the Kumpe catheter and proceeded with treatment.  The 3 mm diameter by 30 cm length angioplasty balloon was inflated across the tibioperoneal trunk was treated  with a short segment and into the proximal and mid posterior tibial arteries.  This was taken to 10 atm for 1 minute.  Completion angiogram showed significant spasm just below the area treated but the areas treated now had 30% stenosis in the mid segment and only about 10 to 15% stenosis in the proximal segment. I elected to terminate the procedure. The sheath was removed and StarClose closure device was deployed in the right femoral artery with excellent hemostatic result. The patient was taken to the recovery room in stable condition having tolerated the procedure well.  Findings:               Aortogram:  This demonstrated normal renal arteries and normal aorta and iliac segments without significant stenosis.             Left lower Extremity:  Normal common femoral artery with a significantly diseased and pruned profunda femoris artery.  Superficial femoral artery and popliteal arteries had good flow with no greater than  25% stenosis.  There is a normal tibial trifurcation.  The anterior tibial artery had multiple short segment greater than 80% stenosis throughout its course in the proximal, mid, and distal anterior tibial artery.  The peroneal artery was continuous although it terminated at the ankle.  The posterior tibial artery had about a 70% stenosis at the proximal segment and about a 90% stenosis in the mid segment.  Right lower extremity: This demonstrated a good common femoral artery with a small diseased profunda femoris artery was seen.  The superficial femoral and popliteal arteries had no greater than 20% stenosis identified.  The tibials did not opacify as well with the retrograde sheath in the right femoral, but once again there was a diffusely diseased anterior tibial artery although it was continuous distally.  The peroneal artery seem to not have focal stenoses but terminate at the ankle.  The posterior tibial artery seem to have greater than 70% stenosis in the mid segment.  This was  nearly mirror-image disease to the left leg.   Disposition: Patient was taken to the recovery room in stable condition having tolerated the procedure well.  Complications: None  Leotis Pain 12/01/2017 2:36 PM   This note was created with Dragon Medical transcription system. Any errors in dictation are purely unintentional.

## 2017-12-01 NOTE — OR Nursing (Signed)
Pt report he uses CPAP machine at home. Respiratory called to set up for procedure

## 2017-12-01 NOTE — Consult Note (Signed)
Pharmacy Antibiotic Note  [name] is a 57 y.o. male admitted on 11/28/2017 with Diabetic foot infection/Wound infection.  Pharmacy has been consulted for Cefepime and Vancomycin  Dosing. Patient received Vancomycin 1g IV x 1 dose and Cefepime 1g IV in ED   Plan: Ke: 0.104    T1/2: 6.7  Vd: 63.7  DW: 91kg Will start Vancomycin 1250 IV every 8 hours, with 6 hour stack dosing.  Calculated trough at Css is 16. Tough level ordered prior to 4th dose.   Start Cefepime 2g IV every 8 hours.  6/2: Vancomycin trough= 13 mcg/ml. MRI= abscess and suspected Osteomyelitis. Will continue current dosing with anticipation that patient will accumulate. Will check another level tomorrow to assess. Patient for possible amputation of toes. Follow renal fxn.  6/3: Vancomycin trough= 13 mcg/ml. Doses were given on time. It appears there is no accumulation for now so we will increase the dose to 1500mg  q8h and check a level prior to the 4th dose  Height: 5\' 10"  (177.8 cm) Weight: 246 lb (111.6 kg) IBW/kg (Calculated) : 73  Temp (24hrs), Avg:97.9 F (36.6 C), Min:97.8 F (36.6 C), Max:98.4 F (36.9 C)  Recent Labs  Lab 11/28/17 1934 11/29/17 0549 11/30/17 0756 12/01/17 1555  WBC 10.3 7.5  --   --   CREATININE 0.82 0.64 0.69  --   LATICACIDVEN 0.9  --   --   --   VANCOTROUGH  --   --  13* 13*    Estimated Creatinine Clearance: 128.9 mL/min (by C-G formula based on SCr of 0.69 mg/dL).    Allergies  Allergen Reactions  . Bactrim [Sulfamethoxazole-Trimethoprim] Hives    Antimicrobials this admission: 6/1 cefepime >>  6/1 vancomycin >>   Thank you for allowing pharmacy to be a part of this patient's care.  Dallie Piles, PharmD Clinical Pharmacist 12/01/2017 4:37 PM

## 2017-12-01 NOTE — Progress Notes (Signed)
Pt was placed on an automated CPAP machine during the procedure.

## 2017-12-01 NOTE — Progress Notes (Signed)
Patient clinically stable post lower extremity angiogram with intervention. Vitals stable. No bleeding nor hematoma at right groin site. Denies complaints. Report called to care nurse with plan reviewed.. Sinus rhythm per monitor.  Dr Lucky Cowboy out to speak with patient with questions answered regarding procedure.

## 2017-12-02 ENCOUNTER — Inpatient Hospital Stay: Payer: Self-pay

## 2017-12-02 ENCOUNTER — Encounter: Payer: Self-pay | Admitting: Vascular Surgery

## 2017-12-02 LAB — GLUCOSE, CAPILLARY
GLUCOSE-CAPILLARY: 143 mg/dL — AB (ref 65–99)
Glucose-Capillary: 163 mg/dL — ABNORMAL HIGH (ref 65–99)
Glucose-Capillary: 67 mg/dL (ref 65–99)
Glucose-Capillary: 144 mg/dL — ABNORMAL HIGH (ref 65–99)
Glucose-Capillary: 139 mg/dL — ABNORMAL HIGH (ref 65–99)
Glucose-Capillary: 137 mg/dL — ABNORMAL HIGH (ref 65–99)

## 2017-12-02 LAB — BASIC METABOLIC PANEL
ANION GAP: 8 (ref 5–15)
BUN: 8 mg/dL (ref 6–20)
CALCIUM: 8.1 mg/dL — AB (ref 8.9–10.3)
CHLORIDE: 105 mmol/L (ref 101–111)
CO2: 24 mmol/L (ref 22–32)
Creatinine, Ser: 0.58 mg/dL — ABNORMAL LOW (ref 0.61–1.24)
GFR calc Af Amer: >60 mL/min (ref >60)
GFR calc non Af Amer: >60 mL/min (ref >60)
GLUCOSE: 77 mg/dL (ref 65–99)
POTASSIUM: 3.9 mmol/L (ref 3.5–5.1)
Sodium: 137 mmol/L (ref 135–145)

## 2017-12-02 LAB — SURGICAL PATHOLOGY

## 2017-12-02 LAB — CBC
HEMATOCRIT: 36.5 % — AB (ref 40.0–52.0)
HEMOGLOBIN: 12.6 g/dL — AB (ref 13.0–18.0)
MCH: 32.3 pg (ref 26.0–34.0)
MCHC: 34.6 g/dL (ref 32.0–36.0)
MCV: 93.4 fL (ref 80.0–100.0)
Platelets: 237 10*3/uL (ref 150–440)
RBC: 3.91 MIL/uL — ABNORMAL LOW (ref 4.40–5.90)
RDW: 13.4 % (ref 11.5–14.5)
WBC: 7.8 10*3/uL (ref 3.8–10.6)

## 2017-12-02 MED ORDER — DOCUSATE SODIUM 100 MG PO CAPS
100.0000 mg | ORAL_CAPSULE | Freq: Two times a day (BID) | ORAL | Status: DC
  Filled 2017-12-02 (×3): qty 1

## 2017-12-02 MED ORDER — SODIUM CHLORIDE 0.9% FLUSH: 10.0000 mL | INTRAVENOUS | Status: DC | PRN

## 2017-12-02 MED ORDER — SODIUM CHLORIDE 0.9% FLUSH
10.0000 mL | Freq: Two times a day (BID) | INTRAVENOUS | Status: DC
  Administered 2017-12-03 – 2017-12-05 (×3): 10 mL

## 2017-12-02 NOTE — Progress Notes (Signed)
Assessment done. Pt awake in bed. Medicated for pain per mar. Pt able to reposition self in bed. Call bell in reach, instructed to call for needs.

## 2017-12-02 NOTE — Consult Note (Signed)
[facility] Infectious Disease     Reason for Consult: DM foot infection    Referring Physician: Governor Specking Date of Admission:  11/28/2017   Principal Problem:   Diabetic foot infection ([place]) Active Problems:   Diabetes mellitus with diabetic polyneuropathy ([place])   HTN (hypertension)   HLD (hyperlipidemia)   Osteomyelitis (HCC)   HPI: [name] is a 57 y.o. male admitted with bil DM foot infections from wound clinic. On admit his wbc was 10 and no fevers. He had MRI done on L with 5th MT abscess and osteo and on R with osteo of distal 5th MT. Culture with GBS and Morganella. He had bil angiogram with stenting. He had on 6/2 with R 5th toe and partial ray amputation and L 5th MT debridement.  ESR 74, A1c 8.8.   He reports he has been dealing with this wounds for some time and following with wound care. He was recently on doxy from 5/10 from wound care center but has been off it. Previous cxs with GB strep.    Past Medical History:  Diagnosis Date  . Arthritis   . BPH (benign prostatic hypertrophy)   . Diabetes mellitus   . Diabetic neuropathy ([place])   . Hypertension   . Metatarsal bone fracture    left 5th toe  . Osteomyelitis of toe of right foot ([place])   . Post-operative infection    and diabetic ulcer left foot  . Sleep apnea     does not wear CPAP  . Wears glasses    Past Surgical History:  Procedure Laterality Date  . COLONOSCOPY W/ BIOPSIES AND POLYPECTOMY    . I&D EXTREMITY Left 01/11/2016   Procedure: LEFT FOOT IRRIGATION AND DEBRIDEMENT WOUND VAC AND REMOVAL OF HARDWARE;  Surgeon: Wylene Simmer, MD;  Location: [place];  Service: Orthopedics;  Laterality: Left;  . LOWER EXTREMITY ANGIOGRAPHY N/A 12/01/2017   Procedure: Lower Extremity Angiography;  Surgeon: Algernon Huxley, MD;  Location: [facility];  Service: Cardiovascular;  Laterality: N/A;  . ORIF TOE FRACTURE Left 06/08/2015   Procedure: OPEN REDUCTION INTERNAL FIXATION (ORIF) LEFT FIFTH METATARSAL BASE  FRACTURE NONUNION; CALCANEAL AUTOGRAFT ;  Surgeon: Wylene Simmer, MD;  Location: [facility];  Service: Orthopedics;  Laterality: Left;  . PATELLA RECONSTRUCTION Left 2005  . PROSTATE ABLATION  2014  . TOE AMPUTATION     partial amputation right great toe  . WOUND DEBRIDEMENT Bilateral 11/30/2017   Procedure: DEBRIDEMENT WOUND;  Surgeon: Sharlotte Alamo, DPM;  Location: [facility];  Service: Podiatry;  Laterality: Bilateral;   Social History   Tobacco Use  . Smoking status: Never Smoker  . Smokeless tobacco: Never Used  Substance Use Topics  . Alcohol use: Yes    Comment: occasional   . Drug use: No   Family History  Problem Relation Age of Onset  . Diabetes Mother   . Diabetes Other     Allergies:  Allergies  Allergen Reactions  . Bactrim [Sulfamethoxazole-Trimethoprim] Hives    Current antibiotics: Antibiotics Given (last 72 hours)    Date/Time Action Medication Dose Rate   11/29/17 1615 New Bag/Given   vancomycin (VANCOCIN) 1,250 mg in sodium chloride 0.9 % 250 mL IVPB 1,250 mg 166.7 mL/hr   11/29/17 2133 New Bag/Given   ceFEPIme (MAXIPIME) 2 g in sodium chloride 0.9 % 100 mL IVPB 2 g 200 mL/hr   11/29/17 2312 New Bag/Given   vancomycin (VANCOCIN) 1,250 mg in sodium chloride 0.9 %  250 mL IVPB 1,250 mg 166.7 mL/hr   11/30/17 0526 New Bag/Given   ceFEPIme (MAXIPIME) 2 g in sodium chloride 0.9 % 100 mL IVPB 2 g 200 mL/hr   11/30/17 0948 New Bag/Given   vancomycin (VANCOCIN) 1,250 mg in sodium chloride 0.9 % 250 mL IVPB 1,250 mg 166.7 mL/hr   11/30/17 1356 New Bag/Given   ceFEPIme (MAXIPIME) 2 g in sodium chloride 0.9 % 100 mL IVPB 2 g 200 mL/hr   11/30/17 1523 New Bag/Given   vancomycin (VANCOCIN) 1,250 mg in sodium chloride 0.9 % 250 mL IVPB 1,250 mg 166.7 mL/hr   11/30/17 2138 New Bag/Given   ceFEPIme (MAXIPIME) 2 g in sodium chloride 0.9 % 100 mL IVPB 2 g 200 mL/hr   12/01/17 0004 New Bag/Given   vancomycin (VANCOCIN) 1,250 mg in sodium chloride 0.9 % 250 mL  IVPB 1,250 mg 166.7 mL/hr   12/01/17 0500 New Bag/Given   ceFEPIme (MAXIPIME) 2 g in sodium chloride 0.9 % 100 mL IVPB 2 g 200 mL/hr   12/01/17 0835 New Bag/Given   vancomycin (VANCOCIN) 1,250 mg in sodium chloride 0.9 % 250 mL IVPB 1,250 mg 166.7 mL/hr   12/01/17 1309 New Bag/Given   ceFAZolin (ANCEF) IVPB 2g/100 mL premix 2 g 200 mL/hr   12/01/17 1603 New Bag/Given   vancomycin (VANCOCIN) 1,250 mg in sodium chloride 0.9 % 250 mL IVPB 1,250 mg 166.7 mL/hr   12/01/17 2211 New Bag/Given   ceFEPIme (MAXIPIME) 2 g in sodium chloride 0.9 % 100 mL IVPB 2 g 200 mL/hr   12/02/17 0157 New Bag/Given   vancomycin (VANCOCIN) 1,500 mg in sodium chloride 0.9 % 500 mL IVPB 1,500 mg 250 mL/hr   12/02/17 1310 New Bag/Given  [Lost IV access]   ceFEPIme (MAXIPIME) 2 g in sodium chloride 0.9 % 100 mL IVPB 2 g 200 mL/hr   12/02/17 1345 New Bag/Given  [IV access lost - AM dose being given late]   vancomycin (VANCOCIN) 1,500 mg in sodium chloride 0.9 % 500 mL IVPB 1,500 mg 250 mL/hr      MEDICATIONS: . aspirin EC  81 mg Oral Daily  . clopidogrel  75 mg Oral Daily  . docusate sodium  100 mg Oral BID  . enoxaparin (LOVENOX) injection  40 mg Subcutaneous Q24H  . insulin aspart  0-15 Units Subcutaneous TID AC & HS  . insulin glargine  35 Units Subcutaneous QHS  . losartan  50 mg Oral Daily  . simvastatin  20 mg Oral QPM    Review of Systems - 11 systems reviewed and negative per HPI   OBJECTIVE: Temp:  [97.7 F (36.5 C)-98.8 F (37.1 C)] 98.7 F (37.1 C) (06/04 1135) Pulse Rate:  [79-93] 86 (06/04 1135) Resp:  [14-19] 17 (06/04 0515) BP: (145-158)/(76-93) 157/83 (06/04 1135) SpO2:  [90 %-100 %] 98 % (06/04 1135) Physical Exam  Constitutional: He is oriented to person, place, and time. He appears well-developed and well-nourished. No distress.  HENT: anicteric Mouth/Throat: Oropharynx is clear and moist. No oropharyngeal exudate.  Cardiovascular: Normal rate, regular rhythm and normal heart  sounds. Pulmonary/Chest: Effort normal and breath sounds normal. No respiratory distress. He has no wheezes.  Abdominal: Soft. Bowel sounds are normal. He exhibits no distension. There is no tenderness.  Lymphadenopathy: He has no cervical adenopathy.  Neurological: He is alert and oriented to person, place, and time.  Skin: bil LE chronic hyperpigmenation and edema bil feet wrapped. Psychiatric: He has a normal mood and affect. His behavior  is normal.     LABS: Results for orders placed or performed during the hospital encounter of 11/28/17 (from the past 48 hour(s))  Glucose, capillary     Status: Abnormal   Collection Time: 11/30/17  6:26 PM  Result Value Ref Range   Glucose-Capillary 252 (H) 65 - 99 mg/dL   Comment 1 Notify RN   Glucose, capillary     Status: Abnormal   Collection Time: 11/30/17  9:02 PM  Result Value Ref Range   Glucose-Capillary 251 (H) 65 - 99 mg/dL  Glucose, capillary     Status: Abnormal   Collection Time: 12/01/17  7:38 AM  Result Value Ref Range   Glucose-Capillary 139 (H) 65 - 99 mg/dL  Glucose, capillary     Status: None   Collection Time: 12/01/17  3:09 PM  Result Value Ref Range   Glucose-Capillary 95 65 - 99 mg/dL  Vancomycin, trough     Status: Abnormal   Collection Time: 12/01/17  3:55 PM  Result Value Ref Range   Vancomycin Tr 13 (L) 15 - 20 ug/mL    Comment: Performed at [facility], [address] [place], [place] [address]  Glucose, capillary     Status: None   Collection Time: 12/01/17  4:41 PM  Result Value Ref Range   Glucose-Capillary 69 65 - 99 mg/dL  Glucose, capillary     Status: Abnormal   Collection Time: 12/01/17  5:45 PM  Result Value Ref Range   Glucose-Capillary 161 (H) 65 - 99 mg/dL  Glucose, capillary     Status: Abnormal   Collection Time: 12/01/17  9:27 PM  Result Value Ref Range   Glucose-Capillary 163 (H) 65 - 99 mg/dL  CBC     Status: Abnormal   Collection Time: 12/02/17  5:26 AM  Result Value Ref  Range   WBC 7.8 3.8 - 10.6 K/uL   RBC 3.91 (L) 4.40 - 5.90 MIL/uL   Hemoglobin 12.6 (L) 13.0 - 18.0 g/dL   HCT 36.5 (L) 40.0 - 52.0 %   MCV 93.4 80.0 - 100.0 fL   MCH 32.3 26.0 - 34.0 pg   MCHC 34.6 32.0 - 36.0 g/dL   RDW 13.4 11.5 - 14.5 %   Platelets 237 150 - 440 K/uL    Comment: Performed at [facility], [address] [place], [place] [address]  Basic metabolic panel     Status: Abnormal   Collection Time: 12/02/17  5:26 AM  Result Value Ref Range   Sodium 137 135 - 145 mmol/L   Potassium 3.9 3.5 - 5.1 mmol/L   Chloride 105 101 - 111 mmol/L   CO2 24 22 - 32 mmol/L   Glucose, Bld 77 65 - 99 mg/dL   BUN 8 6 - 20 mg/dL   Creatinine, Ser 0.58 (L) 0.61 - 1.24 mg/dL   Calcium 8.1 (L) 8.9 - 10.3 mg/dL   GFR calc non Af Amer >60 >60 mL/min   GFR calc Af Amer >60 >60 mL/min    Comment: (NOTE) The eGFR has been calculated using the CKD EPI equation. This calculation has not been validated in all clinical situations. eGFR's persistently <60 mL/min signify possible Chronic Kidney Disease.    Anion gap 8 5 - 15    Comment: Performed at [facility], [address] [place], [place] [address]  Glucose, capillary     Status: None   Collection Time: 12/02/17  8:06 AM  Result Value Ref Range  Glucose-Capillary 67 65 - 99 mg/dL  Glucose, capillary     Status: Abnormal   Collection Time: 12/02/17 10:03 AM  Result Value Ref Range   Glucose-Capillary 144 (H) 65 - 99 mg/dL  Glucose, capillary     Status: Abnormal   Collection Time: 12/02/17 11:35 AM  Result Value Ref Range   Glucose-Capillary 139 (H) 65 - 99 mg/dL   No components found for: ESR, C REACTIVE PROTEIN MICRO: Recent Results (from the past 720 hour(s))  Aerobic Culture (superficial specimen)     Status: None   Collection Time: 11/07/17  1:35 PM  Result Value Ref Range Status   Specimen Description   Final    Foot, Left Performed at [facility], [address] [place], [place]  [address]    Special Requests   Final    NONE Performed at [facility], [place], [place] [address]    Gram Stain   Final    RARE WBC PRESENT,BOTH PMN AND MONONUCLEAR MODERATE GRAM POSITIVE COCCI RARE GRAM NEGATIVE RODS RARE GRAM POSITIVE RODS    Culture   Final    ABUNDANT GROUP B STREP(S.AGALACTIAE)ISOLATED TESTING AGAINST S. AGALACTIAE NOT ROUTINELY PERFORMED DUE TO PREDICTABILITY OF AMP/PEN/VAN SUSCEPTIBILITY. WITHIN MIXED ORGANISMS Performed at [facility], [place] [address], [place], [place] [address]    Report Status 11/10/2017 FINAL  Final  Aerobic/Anaerobic Culture (surgical/deep wound)     Status: None (Preliminary result)   Collection Time: 11/30/17 11:27 AM  Result Value Ref Range Status   Specimen Description   Final    BONE RIGHT FOOT Performed at [facility], [place] [address] [place] [address], [place], [place] [address]    Special Requests   Final    NONE Performed at [facility], [place], [place] [address]    Gram Stain NO WBC SEEN NO ORGANISMS SEEN   Final   Culture   Final    RARE MORGANELLA MORGANII CULTURE REINCUBATED FOR BETTER GROWTH CRITICAL RESULT CALLED TO, READ BACK BY AND VERIFIED WITH: E GANNON,RN AT 1206 12/01/17 BY L BENFIELD CONCERNING GROWTH ON CULTURE Performed at [facility], [place] [address] [place], [place] [address]    Report Status PENDING  Incomplete  Aerobic/Anaerobic Culture (surgical/deep wound)     Status: None (Preliminary result)   Collection Time: 11/30/17 12:02 PM  Result Value Ref Range Status   Specimen Description BONE LEFT FOOT  Final   Special Requests MAXIPIME VANCOMYCIN  Final   Gram Stain   Final    RARE WBC PRESENT, PREDOMINANTLY PMN NO ORGANISMS SEEN    Culture   Final    RARE MORGANELLA MORGANII CULTURE REINCUBATED FOR BETTER GROWTH RARE GROUP B STREP(S.AGALACTIAE)ISOLATED TESTING AGAINST S. AGALACTIAE NOT ROUTINELY PERFORMED DUE TO PREDICTABILITY OF AMP/PEN/VAN  SUSCEPTIBILITY. CRITICAL RESULT CALLED TO, READ BACK BY AND VERIFIED WITH: E GANNON,RN AT 1206 12/01/17 BY L BENFIELD CONCERNING GROWTH ON CULTURE Performed at [facility], [place] [address] [place],  [address]    Report Status PENDING  Incomplete    IMAGING: Mr Foot Right W Wo Contrast  Result Date: 11/29/2017 CLINICAL DATA:  Diabetic patient with a skin ulceration at the head of the right fifth metatarsal. EXAM: MRI OF THE RIGHT FOREFOOT WITHOUT AND WITH CONTRAST TECHNIQUE: Multiplanar, multisequence MR imaging of the right forefoot was performed before and after the administration of intravenous contrast. CONTRAST:  20 ml MULTIHANCE GADOBENATE DIMEGLUMINE 529 MG/ML IV SOLN  COMPARISON:  Plain films right foot 06/26/2015. FINDINGS: Bones/Joint/Cartilage Edema and enhancement seen in the distal 2 cm of the fifth metatarsal consistent osteomyelitis. A milder degree of edema and enhancement are also seen in the proximal 1.2 cm of the proximal phalanx of the little toe. Bone marrow signal is otherwise unremarkable. Ligaments Intact. Muscles and Tendons Intact.  There is fatty atrophy of musculature of the foot. Soft tissues Skin ulceration is seen deep to the head of the fifth metatarsal. IMPRESSION: Skin ulceration deep to the head of the fifth metatarsal without underlying abscess. Findings consistent with osteomyelitis in the distal 2 cm of the fifth metatarsal. Milder degree of edema and enhancement in the proximal 1.2 cm of the fifth toe are likely reactive but could be due to infection. Electronically Signed   By: Inge Rise M.D.   On: 11/29/2017 13:38   Mr Foot Left W Wo Contrast  Result Date: 11/29/2017 CLINICAL DATA:  Skin ulceration at the base of the left fifth metatarsal in a diabetic patient. EXAM: MRI OF THE LEFT FOREFOOT WITHOUT AND WITH CONTRAST TECHNIQUE: Multiplanar, multisequence MR imaging of the plain films left foot 11/28/2017. Was performed both before and after  administration of intravenous contrast. CONTRAST:  20 ml MULTIHANCE GADOBENATE DIMEGLUMINE 529 MG/ML IV SOLN COMPARISON:  None. FINDINGS: Bones/Joint/Cartilage The patient has a remote healed fracture of the proximal diaphysis of the fifth metatarsal. There is marrow edema and enhancement in the proximal 2.2 cm of the first metatarsal consistent with osteomyelitis. Extending approximately 2 cm distal to this area of osteomyelitis, the patient has remote fracture of the fifth metatarsal. There are small foci of susceptibility artifact just distal to this area of abnormal signal suggestive of prior surgery. Mild increased T2 signal is seen in the fifth metatarsal the site of the fracture. Bone marrow signal is otherwise unremarkable. Ligaments Intact. Muscles and Tendons There is atrophy of intrinsic musculature the foot. No intramuscular fluid collection. Soft tissues A skin ulceration is seen deep to the fifth metatarsal base which extends to bone. A thin collection measuring 1.1 cm long by 0.7 cm transverse by 0.4 cm craniocaudal is seen along the base of the fifth metatarsal proximal to the ulceration consistent with abscess. IMPRESSION: Skin ulceration on the plantar surface of the foot at the base of the fifth metatarsal. A small underlying abscess is nearly contiguous with proximal fifth metatarsal. Edema and enhancement in the proximal 2.2 cm of the fifth metatarsal consistent with osteomyelitis. Remote fracture of the proximal diaphysis of the fifth metatarsal with associated foci of susceptibility artifact suggestive of prior surgery. Increased T2 signal at the fracture site may be secondary to the fracture but could represent extension of osteomyelitis. Electronically Signed   By: Inge Rise M.D.   On: 11/29/2017 13:29   Dg Foot Complete Left  Result Date: 11/28/2017 CLINICAL DATA:  Diabetic foot ulcer EXAM: LEFT FOOT - COMPLETE 3+ VIEW COMPARISON:  None. FINDINGS: No fracture or dislocation is  seen. The joint spaces are preserved. Mild cortical thickening/irregularity involving the base of the 5th metatarsal, chronic. No definite erosions. Overlying soft tissue swelling/ulceration. IMPRESSION: Soft tissue swelling/ulceration along the lateral base of the 5th metatarsal. No definite radiographic findings to suggest acute osteomyelitis. Electronically Signed   By: Julian Hy M.D.   On: 11/28/2017 21:56   Dg Foot Complete Left  Result Date: 11/05/2017 CLINICAL DATA:  Left foot pain and swelling with ulcers. EXAM: LEFT FOOT - COMPLETE 3+ VIEW COMPARISON:  Radiographs  of August 30, 2017. FINDINGS: There is no evidence of fracture or dislocation. Joint spaces are intact. Mild posterior calcaneal spurring is noted. Soft tissue ulceration is seen near base of fifth metatarsal without underlying lytic destruction is suggest osteomyelitis. IMPRESSION: Soft tissue ulceration seen overlying proximal base of fifth metatarsal without evidence of lytic destruction to suggest underlying osteomyelitis. Electronically Signed   By: Marijo Conception, M.D.   On: 11/05/2017 12:29    Assessment:   [name] is a 57 y.o. male with bil diabetic foot infections with osteomyelitis of bil Metatarsals, s/p surgical debridement with R 5th toe and partial ray amputation and L 5th MT debridement on 11/30/17.  ESR 74, A1c 8.8  For his PAD he has undergone initial revascularization but will need repeat procedure later this week. He does not smoke. Has been treated recently with doxycycline. Prior otpt cxs in May with Grp B strep and current deep cxs with GBS and Morganella.  In Sept 2018 had infection of L foot with Pseudomonas, E coli and Morganella. He will need a course of IV abx given deep bone infection and PAD.  Will base final cultures on sensitivities of Morganella.   Recommendations Place picc line Can dc vanco since no MRSA on this or recent cultures. Will plan on 4-6 week IV abx course with hopefully IV  ceftraixone for ease of dosing.   Thank you very much for allowing me to participate in the care of this patient. Please call with questions.   Cheral Marker. Ola Spurr, MD

## 2017-12-02 NOTE — Evaluation (Signed)
Physical Therapy Evaluation Patient Details Name: [name] MRN: [id number] DOB: [birth year] Today's Date: 12/02/2017   History of Present Illness  Pt is a 57 y.o. male presenting to hospital 11/28/17 with B ulcers on feet (referrred for IV antibiotics).  Pt found to have osteomyelitis R 5th metatarsal head and toe plus osteomyelitis L 5th metatarsal base.  11/30/17 s/p amputation R 5th toe with partial 5th ray resection; also debridement infected soft tissue and bone L 5th metatarsal.  12/01/17 pt s/p L LE angiogram with intervention.  PMH includes DM, diabetic neuropathy, htn, osteomyelitis R foot toe, L remote fx proximal diaphysis 5th metatarsal (s/p ORIF), sleep apnea, h/o partial amputation R great toe.  Clinical Impression  Prior to hospital admission, pt was independent and working full time.  Pt lives with his wife in 1 level home with 5-6 steps to enter with B railing.  Currently pt is modified independent supine to sit; min assist to stand; and CGA to min assist to ambulate with RW short distance in room (pt with orders for bathroom privileges which will limit distance ambulating).  Pt requiring extra time and cueing to maintain WB'ing/precautions with functional activities and also use of orthowedge and surgical shoe.  Pt would benefit from skilled PT to address noted impairments and functional limitations (see below for any additional details).  Upon hospital discharge, recommend pt discharge to home with HHPT and support of family.    Follow Up Recommendations Home health PT    Equipment Recommendations  Rolling walker with 5" wheels;3in1 (PT)    Recommendations for Other Services       Precautions / Restrictions Precautions Precautions: Fall Precaution Comments: Bathroom privileges with assist.  Elevate operative extremity on 2 pillows at all times. Restrictions Weight Bearing Restrictions: Yes Other Position/Activity Restrictions: Assisted WB'ing with a walker with pressure  only on heels of both feet; orthowedge shoe on R and surgical shoe on the L.      Mobility  Bed Mobility Overal bed mobility: Modified Independent             General bed mobility comments: Supine to sit with mild increased effort  Transfers Overall transfer level: Needs assistance Equipment used: Rolling walker (2 wheeled) Transfers: Sit to/from [facility] Sit to Stand: Min assist Stand pivot transfers: Min guard;Min assist       General transfer comment: vc's for positioning and WB'ing status/precautions required (and cueing for walker use required); assist to initiate stand from bed and recliner; occasional assist to steady taking steps bed to recliner  Ambulation/Gait Ambulation/Gait assistance: Min guard;Min assist Ambulation Distance (Feet): 15 Feet Assistive device: Rolling walker (2 wheeled)   Gait velocity: decreased   General Gait Details: vc's for small short steps in order to maintain pressure only on heels of both feet; vc's to increase UE support on RW to offweight through LE's; occasional assist to steady  Science writer    Modified Rankin (Stroke Patients Only)       Balance Overall balance assessment: Needs assistance Sitting-balance support: No upper extremity supported;Feet supported Sitting balance-Leahy Scale: Normal Sitting balance - Comments: steady sitting reaching outside BOS   Standing balance support: Bilateral upper extremity supported Standing balance-Leahy Scale: Poor Standing balance comment: requires B UE support on RW for balance d/t orders for walker use with pressure only on heels of both feet  Pertinent Vitals/Pain Pain Assessment: 0-10 Pain Score: 6  Pain Location: L foot (chronic) Pain Descriptors / Indicators: Aching Pain Intervention(s): Limited activity within patient's tolerance;Monitored during session;Repositioned(pt declined pain  medication d/t reporting pain was less than he normally has)  Vitals (HR and O2 on room air) stable and WFL throughout treatment session.    Home Living Family/patient expects to be discharged to:: Private residence Living Arrangements: Spouse/significant other Available Help at Discharge: Family Type of Home: House Home Access: Stairs to enter Entrance Stairs-Rails: Right;Left;Can reach both Entrance Stairs-Number of Steps: 5-6 Home Layout: One level        Prior Function Level of Independence: Independent         Comments: Physically active job working 10 hours/day 5-6 days/week.     Hand Dominance        Extremity/Trunk Assessment   Upper Extremity Assessment Upper Extremity Assessment: Overall WFL for tasks assessed    Lower Extremity Assessment Lower Extremity Assessment: Overall WFL for tasks assessed(Good AROM B DF/PF; able to perform B LE SLR independently)    Cervical / Trunk Assessment Cervical / Trunk Assessment: Normal  Communication   Communication: No difficulties  Cognition Arousal/Alertness: Awake/alert Behavior During Therapy: WFL for tasks assessed/performed Overall Cognitive Status: Within Functional Limits for tasks assessed                                        General Comments General comments (skin integrity, edema, etc.): B feet wrapped/dressings in place.  Nursing cleared pt for participation in physical therapy.  Pt agreeable to PT session and eager to get OOB.    Exercises     Assessment/Plan    PT Assessment Patient needs continued PT services  PT Problem List Decreased strength;Decreased balance;Decreased mobility;Decreased activity tolerance;Decreased knowledge of use of DME;Decreased knowledge of precautions;Pain;Decreased skin integrity       PT Treatment Interventions DME instruction;Gait training;Stair training;Functional mobility training;Therapeutic activities;Therapeutic exercise;Balance  training;Patient/family education    PT Goals (Current goals can be found in the Care Plan section)  Acute Rehab PT Goals Patient Stated Goal: to go home PT Goal Formulation: With patient Time For Goal Achievement: 12/16/17 Potential to Achieve Goals: Good    Frequency 7X/week   Barriers to discharge        Co-evaluation               AM-PAC PT "6 Clicks" Daily Activity  Outcome Measure Difficulty turning over in bed (including adjusting bedclothes, sheets and blankets)?: None Difficulty moving from lying on back to sitting on the side of the bed? : A Little Difficulty sitting down on and standing up from a chair with arms (e.g., wheelchair, bedside commode, etc,.)?: Unable Help needed moving to and from a bed to chair (including a wheelchair)?: A Little Help needed walking in hospital room?: A Little Help needed climbing 3-5 steps with a railing? : A Lot 6 Click Score: 16    End of Session Equipment Utilized During Treatment: Gait belt;Other (comment)(orthowedge shoe and surgical shoe) Activity Tolerance: Patient tolerated treatment well;No increased pain Patient left: in chair;with call bell/phone within reach;with chair alarm set;Other (comment)(B LE's elevated on 2 pillows) Nurse Communication: Mobility status;Precautions;Weight bearing status;Other (comment)(Pt's pain status) PT Visit Diagnosis: Unsteadiness on feet (R26.81);Other abnormalities of gait and mobility (R26.89);Muscle weakness (generalized) (M62.81);Difficulty in walking, not elsewhere classified (R26.2);Pain Pain - Right/Left: Left Pain - part of  body: Ankle and joints of foot    Time: 1525-1605 PT Time Calculation (min) (ACUTE ONLY): 40 min   Charges:   PT Evaluation $PT Eval Low Complexity: 1 Low PT Treatments $Gait Training: 8-22 mins $Therapeutic Activity: 8-22 mins   PT G CodesLeitha Bleak, PT 12/02/17, 5:05 PM [phone]

## 2017-12-02 NOTE — NC FL2 (Signed)
[place] LEVEL OF CARE SCREENING TOOL     IDENTIFICATION  Patient Name: [name] Birthdate: 05-10-1961 Sex: male Admission Date (Current Location): 11/28/2017  [place] and [place] Number:  Engineering geologist and Address:  [facility], [address] [place], [place] [address]      Provider Number: [id number]  Attending Physician Name and Address:  Epifanio Lesches, MD  Relative Name and Phone Number:       Current Level of Care: Hospital Recommended Level of Care: [place] Prior Approval Number:    Date Approved/Denied:   PASRR Number: [id number])  Discharge Plan: SNF    Current Diagnoses: Patient Active Problem List   Diagnosis Date Noted  . HTN (hypertension) 11/29/2017  . HLD (hyperlipidemia) 11/29/2017  . Osteomyelitis ([place]) 11/29/2017  . Cellulitis of left foot 03/05/2017  . Surgery, elective   . Hardware complicating wound infection ([place])   . Diabetes mellitus due to underlying condition, uncontrolled, with diabetic neuropathy ([place])   . Metatarsal stress fracture of left foot 01/12/2016  . Diabetes mellitus with diabetic polyneuropathy ([place]) 01/12/2016  . Diabetic foot infection ([place]) 01/12/2016  . Diabetic ulcer of foot associated with type 2 diabetes mellitus, with necrosis of muscle ([place]) 01/11/2016    Orientation RESPIRATION BLADDER Height & Weight     Self, Time, Situation, Place  Normal Continent Weight: 246 lb (111.6 kg) Height:  5\' 10"  (177.8 cm)  BEHAVIORAL SYMPTOMS/MOOD NEUROLOGICAL BOWEL NUTRITION STATUS      Continent Diet(Diet: Carb Modified. )  AMBULATORY STATUS COMMUNICATION OF NEEDS Skin   Extensive Assist Verbally Surgical wounds(ulceration and infection bilateral lower extremities)                       Personal Care Assistance Level of Assistance  Bathing, Feeding, Dressing Bathing Assistance: Limited assistance Feeding assistance: Independent Dressing  Assistance: Limited assistance     Functional Limitations Info  Sight, Hearing, Speech Sight Info: Adequate Hearing Info: Adequate Speech Info: Adequate    SPECIAL CARE FACTORS FREQUENCY  PT (By licensed PT), OT (By licensed OT)     PT Frequency: (5) OT Frequency: (5)            Contractures      Additional Factors Info  Code Status, Allergies(PICC line for IV ABX. ) Code Status Info: (Full Code. ) Allergies Info: (Bactrim Sulfamethoxazole-trimethoprim)           Current Medications (12/02/2017):  This is the current hospital active medication list Current Facility-Administered Medications  Medication Dose Route Frequency Provider Last Rate Last Dose  . acetaminophen (TYLENOL) tablet 650 mg  650 mg Oral Q6H PRN Algernon Huxley, MD       Or  . acetaminophen (TYLENOL) suppository 650 mg  650 mg Rectal Q6H PRN Algernon Huxley, MD      . aspirin EC tablet 81 mg  81 mg Oral Daily Algernon Huxley, MD   81 mg at 12/02/17 0843  . ceFEPIme (MAXIPIME) 2 g in sodium chloride 0.9 % 100 mL IVPB  2 g Intravenous Q8H Algernon Huxley, MD   Stopped at 12/02/17 1340  . clopidogrel (PLAVIX) tablet 75 mg  75 mg Oral Daily Algernon Huxley, MD   75 mg at 12/02/17 0842  . docusate sodium (COLACE) capsule 100 mg  100 mg Oral BID Epifanio Lesches, MD      . enoxaparin (LOVENOX) injection 40 mg  40 mg Subcutaneous  Q24H Algernon Huxley, MD   40 mg at 12/02/17 1157  . insulin aspart (novoLOG) injection 0-15 Units  0-15 Units Subcutaneous TID AC & HS Algernon Huxley, MD   2 Units at 12/02/17 1339  . insulin glargine (LANTUS) injection 35 Units  35 Units Subcutaneous QHS Algernon Huxley, MD   35 Units at 12/01/17 2211  . losartan (COZAAR) tablet 50 mg  50 mg Oral Daily Algernon Huxley, MD   50 mg at 12/02/17 0842  . morphine 2 MG/ML injection 2 mg  2 mg Intravenous Q2H PRN Algernon Huxley, MD      . ondansetron (ZOFRAN) tablet 4 mg  4 mg Oral Q6H PRN Algernon Huxley, MD       Or  . ondansetron (ZOFRAN) injection 4 mg  4 mg  Intravenous Q6H PRN Algernon Huxley, MD      . oxyCODONE-acetaminophen (PERCOCET/ROXICET) 5-325 MG per tablet 1-2 tablet  1-2 tablet Oral Q4H PRN Algernon Huxley, MD   2 tablet at 12/02/17 1343  . simvastatin (ZOCOR) tablet 20 mg  20 mg Oral QPM Algernon Huxley, MD   20 mg at 12/01/17 1753   Facility-Administered Medications Ordered in Other Encounters  Medication Dose Route Frequency Provider Last Rate Last Dose  . Tdap (BOOSTRIX) injection 0.5 mL  0.5 mL Intramuscular Once Betancourt, Aura Fey, NP         Discharge Medications: Please see discharge summary for a list of discharge medications.  Relevant Imaging Results:  Relevant Lab Results:   Additional Information (SSN: [id number])  Colbi Schiltz, Veronia Beets, LCSW

## 2017-12-02 NOTE — Progress Notes (Signed)
1 Day Post-Op   Subjective/Chief Complaint: Patient seen.  States that the pain is getting better.   Objective: Vital signs in last 24 hours: Temp:  [97.7 F (36.5 C)-98.8 F (37.1 C)] 98.4 F (36.9 C) (06/04 0515) Pulse Rate:  [79-93] 79 (06/04 0515) Resp:  [14-22] 17 (06/04 0515) BP: (145-158)/(76-93) 152/80 (06/04 0515) SpO2:  [90 %-100 %] 97 % (06/04 0515) Weight:  [111.6 kg (246 lb)] 111.6 kg (246 lb) (06/03 1029) Last BM Date: 11/28/17  Intake/Output from previous day: 06/03 0701 - 06/04 0700 In: 750 [IV Piggyback:750] Out: 1610 [Urine:3775] Intake/Output this shift: No intake/output data recorded.  Moderate blood is noted on the bandaging on the right foot with more heavy bleeding on the left.  Upon removal both of the incisions are well coapted with decrease in the erythema and edema in the feet and legs.  No significant purulence is noted on the bandaging or from the wounds.  Lab Results:  Recent Labs    12/02/17 0526  WBC 7.8  HGB 12.6*  HCT 36.5*  PLT 237   BMET Recent Labs    11/30/17 0756 12/02/17 0526  NA  --  137  K  --  3.9  CL  --  105  CO2  --  24  GLUCOSE  --  77  BUN  --  8  CREATININE 0.69 0.58*  CALCIUM  --  8.1*   PT/INR No results for input(s): LABPROT, INR in the last 72 hours. ABG No results for input(s): PHART, HCO3 in the last 72 hours.  Invalid input(s): PCO2, PO2  Studies/Results: No results found.  Anti-infectives: Anti-infectives (From admission, onward)   Start     Dose/Rate Route Frequency Ordered Stop   12/02/17 0000  vancomycin (VANCOCIN) 1,500 mg in sodium chloride 0.9 % 500 mL IVPB     1,500 mg 250 mL/hr over 120 Minutes Intravenous Every 8 hours 12/01/17 1643     12/01/17 0827  ceFAZolin (ANCEF) IVPB 2g/100 mL premix     2 g 200 mL/hr over 30 Minutes Intravenous 30 min pre-op 12/01/17 0827 12/01/17 1339   11/29/17 0800  vancomycin (VANCOCIN) 1,250 mg in sodium chloride 0.9 % 250 mL IVPB  Status:  Discontinued      1,250 mg 166.7 mL/hr over 90 Minutes Intravenous Every 8 hours 11/29/17 0135 12/01/17 1643   11/29/17 0600  ceFEPIme (MAXIPIME) 2 g in sodium chloride 0.9 % 100 mL IVPB     2 g 200 mL/hr over 30 Minutes Intravenous Every 8 hours 11/29/17 0136     11/29/17 0100  vancomycin (VANCOCIN) IVPB 1000 mg/200 mL premix     1,000 mg 200 mL/hr over 60 Minutes Intravenous STAT 11/29/17 0050 11/29/17 0220   11/28/17 2345  clindamycin (CLEOCIN) IVPB 900 mg  Status:  Discontinued     900 mg 100 mL/hr over 30 Minutes Intravenous  Once 11/28/17 2336 11/28/17 2342   11/28/17 2345  ceFEPIme (MAXIPIME) 1 g in sodium chloride 0.9 % 100 mL IVPB     1 g 200 mL/hr over 30 Minutes Intravenous  Once 11/28/17 2342 11/29/17 0111      Assessment/Plan: s/p Procedure(s): Lower Extremity Angiography (N/A) Assessment: Stable status post debridement osteomyelitis bilateral.   Plan: Dry sterile dressing change performed on both feet today.  We will give the patient bathroom privileges and will start physical therapy to help him get around with a walker.  Spoke with Dr. Lucky Cowboy yesterday who stated he can perform intervention  on the right leg on Thursday.  I will plan for redressing the feet again on Thursday.  Hopefully at that point he should be stable for discharge and decision will need to be made about going to skilled nursing for his antibiotics and wound care since he will have some challenges getting around on the feet.  If he does go home he will need home health for dressing changes 2-3 times per week.  LOS: 3 days    Durward Fortes 12/02/2017

## 2017-12-02 NOTE — Progress Notes (Signed)
Awake on rounds. Hs snack given. Pt states pain 5/10 and at a tolerable level for him.

## 2017-12-02 NOTE — Clinical Social Work Note (Signed)
Clinical Social Work Assessment  Patient Details  Name: [name] MRN: [id number] Date of Birth: December 23, 1960  Date of referral:  12/02/17               Reason for consult:  Facility Placement                Permission sought to share information with:    Permission granted to share information::     Name::        Agency::     Relationship::     Contact Information:     Housing/Transportation Living arrangements for the past 2 months:  Single Family Home Source of Information:  Patient Patient Interpreter Needed:  None Criminal Activity/Legal Involvement Pertinent to Current Situation/Hospitalization:  No - Comment as needed Significant Relationships:  Spouse Lives with:  Spouse Do you feel safe going back to the place where you live?  Yes Need for family participation in patient care:  Yes (Comment)  Care giving concerns:  Patient lives in [place] with his wife Vern.    Social Worker assessment / plan:   Holiday representative ([place]) reviewed chart and noted that patient will need IV ABX. PT is recommending home health. PT verbally told CSW and RN case manager that patient is a good candidate for home. CSW and RN case manager met with patient alone at bedside to discuss D/C plan. Patient was alert and oriented X4 and was sitting up in the chair at bedside. Patient reported that he was working full time prior to hospitalization. Per patient him and his wife live in [place]. CSW explained SNF process for IV ABX and that [employer] would have to approve it. Patient reported that he has had IV ABX and a wound vac at home before and was able to manage it. Patient reported that he wants to go home. RN case manager will arrange home health. CSW will continue to follow and assist as needed.     Employment status:  Therapist, music:  Managed Care PT Recommendations:  Home with [place] / Referral to community resources:  Other (Comment Required)(Patient  prefers to go home with home health. )  Patient/Family's Response to care:  Patient prefers to go home.   Patient/Family's Understanding of and Emotional Response to Diagnosis, Current Treatment, and Prognosis:  Patient appeared motivated to go home.   Emotional Assessment Appearance:  Appears stated age Attitude/Demeanor/Rapport:    Affect (typically observed):  Accepting, Adaptable, Pleasant Orientation:  Oriented to Self, Oriented to Place, Oriented to  Time, Oriented to Situation Alcohol / Substance use:  Not Applicable Psych involvement (Current and /or in the community):  No (Comment)  Discharge Needs  Concerns to be addressed:  No discharge needs identified Readmission within the last 30 days:  No Current discharge risk:  Dependent with Mobility Barriers to Discharge:  Continued Medical Work up   [employer], Veronia Beets, LCSW 12/02/2017, 4:30 PM

## 2017-12-02 NOTE — Care Management (Addendum)
Heads up referral to [facility] regarding home IV antibiotics. Waiting on PT consult

## 2017-12-02 NOTE — Progress Notes (Signed)
Peripherally Inserted Central Catheter/Midline Placement  The IV Nurse has discussed with the patient and/or persons authorized to consent for the patient, the purpose of this procedure and the potential benefits and risks involved with this procedure.  The benefits include less needle sticks, lab draws from the catheter, and the patient may be discharged home with the catheter. Risks include, but not limited to, infection, bleeding, blood clot (thrombus formation), and puncture of an artery; nerve damage and irregular heartbeat and possibility to perform a PICC exchange if needed/ordered by physician.  Alternatives to this procedure were also discussed.  Bard Power PICC patient education guide, fact sheet on infection prevention and patient information card has been provided to patient /or left at bedside.    PICC/Midline Placement Documentation  PICC Single Lumen 03/00/92 PICC Right Basilic 43 cm 0 cm (Active)  Indication for Insertion or Continuance of Line Home intravenous therapies (PICC only) 12/02/2017  5:50 PM  Exposed Catheter (cm) 0 cm 12/02/2017  5:50 PM  Site Assessment Clean;Dry;Intact 12/02/2017  5:50 PM  Line Status Flushed;Saline locked;Blood return noted 12/02/2017  5:50 PM  Dressing Type Transparent;Securing device 12/02/2017  5:50 PM  Dressing Status Clean;Dry;Intact;Antimicrobial disc in place 12/02/2017  5:50 PM  Dressing Change Due 12/09/17 12/02/2017  5:50 PM       Frances Maywood 12/02/2017, 5:53 PM

## 2017-12-02 NOTE — Plan of Care (Signed)
IV access lost prior to shift change. Re-access attempted, but patient is difficult stick.  IV team order placed x2. Needed antibiotics will be addressed once access is regained.

## 2017-12-02 NOTE — Progress Notes (Addendum)
[facility] [place] NAME: [name]    MR#:  [id number]  DATE OF BIRTH:  03-Jan-1961  SUBJECTIVE: Patient admitted for diabetic foot infection of both feet,, patient has right fifth toe amputation, left fifth metatarsal debridement by podiatry, and vascular intervention with left leg angioplasty yesterday.  Patient tolerated the procedure, wound cultures are growing Morganella, streptococci. Patient says that he had a lot of pain in both feet before but is feeling better now.  CHIEF COMPLAINT:   Chief Complaint  Patient presents with  . Wound Infection     REVIEW OF SYSTEMS:   ROS CONSTITUTIONAL: No fever, fatigue or weakness.  EYES: No blurred or double vision.  EARS, NOSE, AND THROAT: No tinnitus or ear pain.  RESPIRATORY: No cough, shortness of breath, wheezing or hemoptysis.  CARDIOVASCULAR: No chest pain, orthopnea, edema.  GASTROINTESTINAL: No nausea, vomiting, diarrhea or abdominal pain.  GENITOURINARY: No dysuria, hematuria.  ENDOCRINE: No polyuria, nocturia,  HEMATOLOGY: No anemia, easy bruising or bleeding SKIN: No rash or lesion. MUSCULOSKELETAL:Dressig present for both legs NEUROLOGIC: No tingling, numbness, weakness.  PSYCHIATRY: No anxiety or depression.   DRUG ALLERGIES:   Allergies  Allergen Reactions  . Bactrim [Sulfamethoxazole-Trimethoprim] Hives    VITALS:  Blood pressure (!) 157/83, pulse 86, temperature 98.7 F (37.1 C), temperature source Oral, resp. rate 17, height 5\' 10"  (1.778 m), weight 111.6 kg (246 lb), SpO2 98 %.  PHYSICAL EXAMINATION:  GENERAL:  57 y.o.-year-old patient lying in the bed with no acute distress.  EYES: Pupils equal, round, reactive to light and accommodation. No scleral icterus. Extraocular muscles intact.  HEENT: Head atraumatic, normocephalic. Oropharynx and nasopharynx ID consult requested, NECK:  Supple, no jugular venous distention. No thyroid enlargement, no  tenderness.  LUNGS: Normal breath sounds bilaterally, no wheezing, rales,rhonchi or crepitation. No use of accessory muscles of respiration.  CARDIOVASCULAR: S1, S2 normal. No murmurs, rubs, or gallops.  ABDOMEN: Soft, nontender, nondistended. Bowel sounds present. No organomegaly or mass.  EXTREMITIES: Dressing present for both  nEUROLOGIC: Cranial nerves II through XII are intact. Muscle strength 5/5 in UE.. Gait not checked.  PSYCHIATRIC: The patient is alert and oriented x 3.  SKIN: No obvious rash, lesion, or ulcer.    LABORATORY PANEL:   CBC Recent Labs  Lab 12/02/17 0526  WBC 7.8  HGB 12.6*  HCT 36.5*  PLT 237   ------------------------------------------------------------------------------------------------------------------  Chemistries  Recent Labs  Lab 11/28/17 1934  12/02/17 0526  NA 134*   < > 137  K 3.6   < > 3.9  CL 97*   < > 105  CO2 27   < > 24  GLUCOSE 196*   < > 77  BUN 15   < > 8  CREATININE 0.82   < > 0.58*  CALCIUM 8.9   < > 8.1*  AST 17  --   --   ALT 15*  --   --   ALKPHOS 105  --   --   BILITOT 0.5  --   --    < > = values in this interval not displayed.   ------------------------------------------------------------------------------------------------------------------  Cardiac Enzymes No results for input(s): TROPONINI in the last 168 hours. ------------------------------------------------------------------------------------------------------------------  RADIOLOGY:  [place] Ekg Site Rite  Result Date: 12/02/2017 If Site Rite image not attached, placement could not be confirmed due to current cardiac rhythm.   EKG:   Orders placed or performed during the hospital encounter of 06/08/15  .  EKG 12 lead  . EKG 12 lead    ASSESSMENT AND PLAN:   Acute osteomyelitis of both  Right and left 5th  metatarsal,s/p right 5th toe amputation, debridement of left 5 th toe.podiatry discussed with patient, patient wife extensively about postoperative  recovery, walking and ability to get around in future.    Bilateral foot cellulitis, patient went for right fifth metatarsal amputation, left foot debridement by podiatry, seen by Dr. Ola Spurr from ID recommended PICC line, Rocephin for 4 to 6 weeks, discontinued vancomycin,change cefepime to Rocephin at discharge.  Patient MRSA screen is negative.  Physical therapy evaluation,  #2. PVD, nonpalpable pedal pulse and chronic diabetic foot ulcers recommend bilateral leg angiogram today with angioplasty of the left leg,  patient is on aspirin, Plavix..  Patient found to have 70% stenosis of posterior tibial artery bilaterally, patient is going to have right leg angiogram, further intervention on Thursday.  #3 .diabetes mellitus type 2: Patient is on Lantus, sliding scale insulin with coverage.hba1c 8.8.uncontrolled.,  Seen by diabetes coordinator.  Patient has mild low blood sugar this morning, discontinue nighttime coverage of subcu insulin as per diabetic coordinator recommendation.  All the records are reviewed and case discussed with Care Management/Social Workerr. Management plans discussed with the patient, family and they are in agreement.  CODE STATUS: Full code  TOTAL TIME TAKING CARE OF THIS PATIENT: 35 minutes.   POSSIBLE D/C IN 1-2 DAYS, DEPENDING ON CLINICAL CONDITION.   Epifanio Lesches M.D on 12/02/2017 at 4:37 PM  Between 7am to 6pm - Pager - [phone]  After 6pm go to [email or web address] - password [email or web address] [facility]  Office  [phone]  CC: Primary care physician; Lorelee Market, MD   Note: This dictation was prepared with Dragon dictation along with smaller phrase technology. Any transcriptional errors that result from this process are unintentional.

## 2017-12-02 NOTE — Progress Notes (Signed)
Inpatient Diabetes Program Recommendations  AACE/ADA: New Consensus Statement on Inpatient Glycemic Control (2015)  Target Ranges:  Prepandial:   less than 140 mg/dL      [facility] postprandial:   less than 180 mg/dL (1-2 hours)      Critically ill patients:  140 - 180 mg/dL   Lab Results  Component Value Date   GLUCAP 139 (H) 12/02/2017   HGBA1C 8.8 (H) 11/29/2017    Review of Glycemic ControlResults for JARREN, PARA (MRN [id number]) as of 12/02/2017 13:54  Ref. Range 12/01/2017 21:27 12/02/2017 08:06 12/02/2017 10:03 12/02/2017 11:35  Glucose-Capillary Latest Ref Range: 65 - 99 mg/dL 163 (H) 67 144 (H) 139 (H)   Diabetes history: Type 2 DM  Outpatient Diabetes medications:  Novolog 12-20 units tid with meals, Basaglar 35 units q HS Current orders for Inpatient glycemic control:  Novolog moderate tid with meals and HS, Lantus 35 units q HS Inpatient Diabetes Program Recommendations:    Note mild low blood sugar this morning.  Please d/c HS/bedtime Novolog correction scale. Text page sent.  Thanks,  Adah Perl, RN, BC-ADM Inpatient Diabetes Coordinator Pager [phone] (8a-5p)

## 2017-12-03 DIAGNOSIS — I70248 Atherosclerosis of native arteries of left leg with ulceration of other part of lower left leg: Secondary | ICD-10-CM

## 2017-12-03 LAB — GLUCOSE, CAPILLARY
GLUCOSE-CAPILLARY: 167 mg/dL — AB (ref 65–99)
GLUCOSE-CAPILLARY: 118 mg/dL — AB (ref 65–99)
Glucose-Capillary: 122 mg/dL — ABNORMAL HIGH (ref 65–99)
Glucose-Capillary: 126 mg/dL — ABNORMAL HIGH (ref 65–99)

## 2017-12-03 MED ORDER — AMLODIPINE BESYLATE 5 MG PO TABS
5.0000 mg | ORAL_TABLET | Freq: Every day | ORAL | Status: DC
  Administered 2017-12-03 – 2017-12-05 (×2): 5 mg via ORAL
  Filled 2017-12-03 (×2): qty 1

## 2017-12-03 MED ORDER — CEFTRIAXONE SODIUM 2 G IJ SOLR
2.0000 g | INTRAMUSCULAR | Status: DC
Start: 2017-12-03 — End: 2017-12-05
  Administered 2017-12-03 – 2017-12-04 (×2): 2 g via INTRAVENOUS
  Filled 2017-12-03: qty 20
  Filled 2017-12-03 (×2): qty 2

## 2017-12-03 MED ORDER — CEFAZOLIN SODIUM-DEXTROSE 2-4 GM/100ML-% IV SOLN
2.0000 g | INTRAVENOUS | Status: DC
  Filled 2017-12-03: qty 100

## 2017-12-03 NOTE — Clinical Social Work Placement (Signed)
   CLINICAL SOCIAL WORK PLACEMENT  NOTE  Date:  12/03/2017  Patient Details  Name: [name] MRN: [id number] Date of Birth: September 28, 1960  Clinical Social Work is seeking post-discharge placement for this patient at the [place] level of care (*CSW will initial, date and re-position this form in  chart as items are completed):  Yes   Patient/family provided with [facility] Work Department's list of facilities offering this level of care within the geographic area requested by the patient (or if unable, by the patient's family).  Yes   Patient/family informed of their freedom to choose among providers that offer the needed level of care, that participate in Medicare, Medicaid or managed care program needed by the patient, have an available bed and are willing to accept the patient.  Yes   Patient/family informed of [facility]'s ownership interest in [facility] and [facility], as well as of the fact that they are under no obligation to receive care at these facilities.  PASRR submitted to EDS on 12/02/17     PASRR number received on 12/02/17     Existing PASRR number confirmed on       FL2 transmitted to all facilities in geographic area requested by pt/family on 12/03/17     FL2 transmitted to all facilities within larger geographic area on       Patient informed that his/her managed care company has contracts with or will negotiate with certain facilities, including the following:        Yes   Patient/family informed of bed offers received.  Patient chooses bed at ([facility] )     Physician recommends and patient chooses bed at      Patient to be transferred to   on  .  Patient to be transferred to facility by       Patient family notified on   of transfer.  Name of family member notified:        PHYSICIAN       Additional Comment:    _______________________________________________ Freemon Binford, Veronia Beets, LCSW 12/03/2017, 1:15  PM

## 2017-12-03 NOTE — Progress Notes (Signed)
Patient ID: [name], male   DOB: Feb 18, 1961, 57 y.o.   MRN: [id number] Subjective: Patient seen.  States he was able to get up and walk around on the feet with no significant increase in his pain.  Objective: Dressings are dry and intact.  Assessment: Stable status post debridement osteomyelitis bilateral.  Plan: Dressings left intact.  I will plan for dressing changes tomorrow.  Vascular procedure scheduled for the right leg tomorrow.  I did speak with the patient about discharge planning and he does think that he may want to go to a facility for a few weeks for rehab which I think is a good idea.  Infectious disease to manage his antibiotics.  If he does go to a facility he will need a dry sterile dressing change 2-3 times a week depending on drainage.  Plan for follow-up in 1 week outpatient.

## 2017-12-03 NOTE — Progress Notes (Signed)
[name] INFECTIOUS DISEASE PROGRESS NOTE Date of Admission:  11/28/2017     ID: [name] is a 57 y.o. male with  DM foot infection Principal Problem:   Diabetic foot infection ([place]) Active Problems:   Diabetes mellitus with diabetic polyneuropathy ([place])   HTN (hypertension)   HLD (hyperlipidemia)   Osteomyelitis (HCC)   Subjective: No fevers  ROS  Eleven systems are reviewed and negative except per hpi  Medications:  Antibiotics Given (last 72 hours)    Date/Time Action Medication Dose Rate   11/30/17 1523 New Bag/Given   vancomycin (VANCOCIN) 1,250 mg in sodium chloride 0.9 % 250 mL IVPB 1,250 mg 166.7 mL/hr   11/30/17 2138 New Bag/Given   ceFEPIme (MAXIPIME) 2 g in sodium chloride 0.9 % 100 mL IVPB 2 g 200 mL/hr   12/01/17 0004 New Bag/Given   vancomycin (VANCOCIN) 1,250 mg in sodium chloride 0.9 % 250 mL IVPB 1,250 mg 166.7 mL/hr   12/01/17 0500 New Bag/Given   ceFEPIme (MAXIPIME) 2 g in sodium chloride 0.9 % 100 mL IVPB 2 g 200 mL/hr   12/01/17 0835 New Bag/Given   vancomycin (VANCOCIN) 1,250 mg in sodium chloride 0.9 % 250 mL IVPB 1,250 mg 166.7 mL/hr   12/01/17 1309 New Bag/Given   ceFAZolin (ANCEF) IVPB 2g/100 mL premix 2 g 200 mL/hr   12/01/17 1603 New Bag/Given   vancomycin (VANCOCIN) 1,250 mg in sodium chloride 0.9 % 250 mL IVPB 1,250 mg 166.7 mL/hr   12/01/17 2211 New Bag/Given   ceFEPIme (MAXIPIME) 2 g in sodium chloride 0.9 % 100 mL IVPB 2 g 200 mL/hr   12/02/17 0157 New Bag/Given   vancomycin (VANCOCIN) 1,500 mg in sodium chloride 0.9 % 500 mL IVPB 1,500 mg 250 mL/hr   12/02/17 1310 New Bag/Given  [Lost IV access]   ceFEPIme (MAXIPIME) 2 g in sodium chloride 0.9 % 100 mL IVPB 2 g 200 mL/hr   12/02/17 1345 New Bag/Given  [IV access lost - AM dose being given late]   vancomycin (VANCOCIN) 1,500 mg in sodium chloride 0.9 % 500 mL IVPB 1,500 mg 250 mL/hr   12/02/17 2029 New Bag/Given   ceFEPIme (MAXIPIME) 2 g in sodium chloride 0.9 % 100 mL IVPB 2  g 200 mL/hr   12/03/17 0517 New Bag/Given   ceFEPIme (MAXIPIME) 2 g in sodium chloride 0.9 % 100 mL IVPB 2 g 200 mL/hr   12/03/17 1349 New Bag/Given   ceFEPIme (MAXIPIME) 2 g in sodium chloride 0.9 % 100 mL IVPB 2 g 200 mL/hr     . aspirin EC  81 mg Oral Daily  . clopidogrel  75 mg Oral Daily  . docusate sodium  100 mg Oral BID  . enoxaparin (LOVENOX) injection  40 mg Subcutaneous Q24H  . insulin aspart  0-15 Units Subcutaneous TID AC & HS  . insulin glargine  35 Units Subcutaneous QHS  . losartan  50 mg Oral Daily  . simvastatin  20 mg Oral QPM  . sodium chloride flush  10-40 mL Intracatheter Q12H    Objective: Vital signs in last 24 hours: Temp:  [98 F (36.7 C)-99.4 F (37.4 C)] 99.4 F (37.4 C) (06/05 0816) Pulse Rate:  [78-88] 88 (06/05 0816) Resp:  [16-20] 18 (06/05 0816) BP: (153-170)/(83-97) 166/97 (06/05 0816) SpO2:  [92 %-100 %] 96 % (06/05 0816) Constitutional: He is oriented to person, place, and time. He appears well-developed and well-nourished. No distress.  HENT: anicteric Mouth/Throat: Oropharynx is clear and moist.  No oropharyngeal exudate.  Cardiovascular: Normal rate, regular rhythm and normal heart sounds. Pulmonary/Chest: Effort normal and breath sounds normal. No respiratory distress. He has no wheezes.  Abdominal: Soft. Bowel sounds are normal. He exhibits no distension. There is no tenderness.  Lymphadenopathy: He has no cervical adenopathy.  Neurological: He is alert and oriented to person, place, and time.  Skin: bil LE chronic hyperpigmenation and edema bil feet wrapped. Psychiatric: He has a normal mood and affect. His behavior is normal.     Lab Results Recent Labs    12/02/17 0526  WBC 7.8  HGB 12.6*  HCT 36.5*  NA 137  K 3.9  CL 105  CO2 24  BUN 8  CREATININE 0.58*   Lab Results  Component Value Date   ESRSEDRATE 74 (H) 11/28/2017    Microbiology: Results for orders placed or performed during the hospital encounter of  11/28/17  Aerobic/Anaerobic Culture (surgical/deep wound)     Status: None (Preliminary result)   Collection Time: 11/30/17 11:27 AM  Result Value Ref Range Status   Specimen Description   Final    BONE RIGHT FOOT Performed at [facility], [place] [address], [place], [place] [address]    Special Requests   Final    NONE Performed at [facility], [place], [place] [address]    Gram Stain NO WBC SEEN NO ORGANISMS SEEN   Final   Culture   Final    RARE MORGANELLA MORGANII CRITICAL RESULT CALLED TO, READ BACK BY AND VERIFIED WITH: E GANNON,RN AT 1206 12/01/17 BY L BENFIELD CONCERNING GROWTH ON CULTURE Performed at [facility], [place] [address] [place], [place] [address]    Report Status PENDING  Incomplete   Organism ID, Bacteria MORGANELLA MORGANII  Final      Susceptibility   Morganella morganii - MIC*    AMPICILLIN >=32 RESISTANT Resistant     CEFAZOLIN >=64 RESISTANT Resistant     CEFEPIME <=1 SENSITIVE Sensitive     CEFTAZIDIME <=1 SENSITIVE Sensitive     CEFTRIAXONE <=1 SENSITIVE Sensitive     CIPROFLOXACIN <=0.25 SENSITIVE Sensitive     GENTAMICIN <=1 SENSITIVE Sensitive     IMIPENEM 4 SENSITIVE Sensitive     TRIMETH/SULFA <=20 SENSITIVE Sensitive     AMPICILLIN/SULBACTAM 16 INTERMEDIATE Intermediate     PIP/TAZO <=4 SENSITIVE Sensitive     * RARE MORGANELLA MORGANII  Aerobic/Anaerobic Culture (surgical/deep wound)     Status: None (Preliminary result)   Collection Time: 11/30/17 12:02 PM  Result Value Ref Range Status   Specimen Description BONE LEFT FOOT  Final   Special Requests MAXIPIME VANCOMYCIN  Final   Gram Stain   Final    RARE WBC PRESENT, PREDOMINANTLY PMN NO ORGANISMS SEEN    Culture   Final    RARE STREPTOCOCCUS AGALACTIAE TESTING AGAINST S. AGALACTIAE NOT ROUTINELY PERFORMED DUE TO PREDICTABILITY OF AMP/PEN/VAN SUSCEPTIBILITY. RARE MORGANELLA MORGANII SUSCEPTIBILITIES PERFORMED ON PREVIOUS CULTURE WITHIN THE LAST 5  DAYS. CRITICAL RESULT CALLED TO, READ BACK BY AND VERIFIED WITH: E GANNON AT 1206 12/01/17 BY L BENFIELD CONCERNING GROWTH ON CULTURE Performed at [facility], [place] [address], [place], [place] [address]    Report Status PENDING  Incomplete   Studies/Results: [place] Ekg Site Rite  Result Date: 12/02/2017 If Site Rite image not attached, placement could not be confirmed due to current cardiac rhythm.  Assessment/Plan: DESMEN SCHOFFSTALL is a 57 y.o. male with bil diabetic  foot infections with osteomyelitis of bil Metatarsals, s/p surgical debridement with R 5th toe and partial ray amputation and L 5th MT debridement on 11/30/17.  ESR 74, A1c 8.8  For his PAD he has undergone initial revascularization but will need repeat procedure later this week. He does not smoke. Has been treated recently with doxycycline. Prior otpt cxs in May with Grp B strep and current deep cxs with GBS and Morganella.  In Sept 2018 had infection of L foot with Pseudomonas, E coli and Morganella. He will need a course of IV abx given deep bone infection and PAD.   Picc placed  Recommendations Can change to Ceftriaxone to cover the Morganella and GBS Will plan on 4-6 week IV abx course followed by oral tail therapy if still needed.  Thank you very much for the consult. Will follow with you.  Leonel Ramsay   12/03/2017, 2:15 PM

## 2017-12-03 NOTE — Progress Notes (Signed)
Physical Therapy Treatment Patient Details Name: [name] MRN: [id number] DOB: 1961-05-27 Today's Date: 12/03/2017    History of Present Illness Pt is a 57 y.o. male presenting to hospital 11/28/17 with B ulcers on feet (referrred for IV antibiotics).  Pt found to have osteomyelitis R 5th metatarsal head and toe plus osteomyelitis L 5th metatarsal base.  11/30/17 s/p amputation R 5th toe with partial 5th ray resection; also debridement infected soft tissue and bone L 5th metatarsal.  12/01/17 pt s/p L LE angiogram with intervention.  PMH includes DM, diabetic neuropathy, htn, osteomyelitis R foot toe, L remote fx proximal diaphysis 5th metatarsal (s/p ORIF), sleep apnea, h/o partial amputation R great toe.    PT Comments    Pt able to ambulate short distance with RW to bathroom, sit, and then walk back to recliner.  Pt requiring initial and then occasional cueing for WB'ing/precautions with LE's during session.  Pt making good progress with functional mobility; distance ambulating limited per orders for bathroom privileges.  5/10 L foot pain beginning and end of session.  Will progress pt with functional mobility and LE HEP next session.  Pt reporting concerns for discharge home and requesting to talk to SW regarding STR: SW notified.   Follow Up Recommendations  Home health PT     Equipment Recommendations  Rolling walker with 5" wheels;3in1 (PT)    Recommendations for Other Services       Precautions / Restrictions Precautions Precautions: Fall Precaution Comments: Bathroom privileges with assist.  Elevate operative extremity on 2 pillows at all times. Restrictions Weight Bearing Restrictions: Yes Other Position/Activity Restrictions: Assisted WB'ing with a walker with pressure only on heels of both feet; orthowedge shoe on R and surgical shoe on the L.    Mobility  Bed Mobility Overal bed mobility: Modified Independent             General bed mobility comments: Supine to  sit with mild increased effort  Transfers Overall transfer level: Needs assistance Equipment used: Rolling walker (2 wheeled) Transfers: Sit to/from Stand Sit to Stand: Min guard         General transfer comment: initial and then occasional vc's for positioning and WB'ing status/precautions required  Ambulation/Gait Ambulation/Gait assistance: Min guard Ambulation Distance (Feet): (20 feet x2) Assistive device: Rolling walker (2 wheeled)   Gait velocity: decreased   General Gait Details: initial and then occasional vc's for small short steps in order to maintain pressure only on heels of both feet; initial and then occasional vc's to increase UE support on RW to offweight through [employer]    Modified Rankin (Stroke Patients Only)       Balance Overall balance assessment: Needs assistance Sitting-balance support: No upper extremity supported;Feet supported Sitting balance-Leahy Scale: Normal Sitting balance - Comments: steady sitting reaching outside BOS   Standing balance support: Bilateral upper extremity supported Standing balance-Leahy Scale: Poor Standing balance comment: requires B UE support on RW for balance d/t orders for walker use with pressure only on heels of both feet                            Cognition Arousal/Alertness: Awake/alert Behavior During Therapy: WFL for tasks assessed/performed Overall Cognitive Status: Within Functional Limits for tasks assessed  Exercises General Exercises - Lower Extremity Long Arc Quad: AROM;Strengthening;Both;10 reps;Seated Hip Flexion/Marching: AROM;Strengthening;Both;10 reps;Seated    General Comments General comments (skin integrity, edema, etc.): B feet wrapped/dressings in place.  Nursing cleared pt for participation in physical therapy.  Pt agreeable to PT session.      Pertinent Vitals/Pain       Home Living                      Prior Function            PT Goals (current goals can now be found in the care plan section) Acute Rehab PT Goals Patient Stated Goal: to go home PT Goal Formulation: With patient Time For Goal Achievement: 12/16/17 Potential to Achieve Goals: Good Progress towards PT goals: Progressing toward goals    Frequency    7X/week      PT Plan Current plan remains appropriate    Co-evaluation              AM-PAC PT "6 Clicks" Daily Activity  Outcome Measure  Difficulty turning over in bed (including adjusting bedclothes, sheets and blankets)?: None Difficulty moving from lying on back to sitting on the side of the bed? : A Little Difficulty sitting down on and standing up from a chair with arms (e.g., wheelchair, bedside commode, etc,.)?: Unable Help needed moving to and from a bed to chair (including a wheelchair)?: A Little Help needed walking in hospital room?: A Little Help needed climbing 3-5 steps with a railing? : A Lot 6 Click Score: 16    End of Session Equipment Utilized During Treatment: Gait belt;Other (comment)(orthowedge and surgical shoe) Activity Tolerance: Patient tolerated treatment well;No increased pain Patient left: in chair;with call bell/phone within reach;with chair alarm set;Other (comment)(B LE's elevated on 2 pillows) Nurse Communication: Mobility status;Precautions;Weight bearing status;Other (comment)(Pt's pain status) PT Visit Diagnosis: Unsteadiness on feet (R26.81);Other abnormalities of gait and mobility (R26.89);Muscle weakness (generalized) (M62.81);Difficulty in walking, not elsewhere classified (R26.2);Pain Pain - Right/Left: Left Pain - part of body: Ankle and joints of foot     Time: 2876-8115 PT Time Calculation (min) (ACUTE ONLY): 39 min  Charges:  $Gait Training: 8-22 mins $Therapeutic Exercise: 8-22 mins $Therapeutic Activity: 8-22 mins                    G CodesLeitha Bleak, PT 12/03/17, 4:50 PM [phone]

## 2017-12-03 NOTE — Progress Notes (Signed)
PHARMACY CONSULT NOTE FOR:  OUTPATIENT  PARENTERAL ANTIBIOTIC THERAPY (OPAT)  Indication: Osteomyelitis  Regimen: Ceftriaxone 2g IV q24h  End date: 12/28/17  IV antibiotic discharge orders are pended. To discharging provider:  please sign these orders via discharge navigator,  Select New Orders & click on the button choice - Manage This Unsigned Work.    Thank you for allowing pharmacy to be a part of this patient's care.  Candelaria Stagers, PharmD Pharmacy Resident  12/03/2017, 4:25 PM

## 2017-12-03 NOTE — Progress Notes (Signed)
No acute events overnight. Pt slept in intervals, cpap on while sleeping, no resp distress. Dressings to both feet intact and elevated on 2 pillows. Call bell in reach.

## 2017-12-03 NOTE — Progress Notes (Signed)
Sleeping on rounds. cpap on, resp easy.

## 2017-12-03 NOTE — Progress Notes (Signed)
Assessment done. Awake in bed. Able to position self in bed without assist. States he had a good day. Pt signed consent for  Procedure tomorrow. Call bell in reach, instructed to call for needs.

## 2017-12-03 NOTE — Progress Notes (Signed)
[place] [facility] [place] NAME: [name]    MR#:  [id number]  DATE OF BIRTH:  Aug 11, 1960  SUBJECTIVE:   No acute events overnight, plan for angiogram of the right lower extremity tomorrow.  She is status post left lower extremity revascularization.  Postop day #2 today.    REVIEW OF SYSTEMS:    Review of Systems  Constitutional: Negative for chills and fever.  HENT: Negative for congestion and tinnitus.   Eyes: Negative for blurred vision and double vision.  Respiratory: Negative for cough, shortness of breath and wheezing.   Cardiovascular: Negative for chest pain, orthopnea and PND.  Gastrointestinal: Negative for abdominal pain, diarrhea, nausea and vomiting.  Genitourinary: Negative for dysuria and hematuria.  Neurological: Negative for dizziness, sensory change and focal weakness.  All other systems reviewed and are negative.   Nutrition: Carb control Tolerating Diet: Yes Tolerating PT: Eval noted.    DRUG ALLERGIES:   Allergies  Allergen Reactions  . Bactrim [Sulfamethoxazole-Trimethoprim] Hives    VITALS:  Blood pressure (!) 166/97, pulse 88, temperature 99.4 F (37.4 C), temperature source Oral, resp. rate 18, height 5\' 10"  (1.778 m), weight 111.6 kg (246 lb), SpO2 96 %.  PHYSICAL EXAMINATION:   Physical Exam  GENERAL:  57 y.o.-year-old patient lying in bed in no acute distress.  EYES: Pupils equal, round, reactive to light and accommodation. No scleral icterus. Extraocular muscles intact.  HEENT: Head atraumatic, normocephalic. Oropharynx and nasopharynx clear.  NECK:  Supple, no jugular venous distention. No thyroid enlargement, no tenderness.  LUNGS: Normal breath sounds bilaterally, no wheezing, rales, rhonchi. No use of accessory muscles of respiration.  CARDIOVASCULAR: S1, S2 normal. No murmurs, rubs, or gallops.  ABDOMEN: Soft, nontender, nondistended. Bowel sounds present. No organomegaly or mass.   EXTREMITIES: No cyanosis, clubbing or edema b/l. B/l LE wrapped in Kerlex from recent surgery.     NEUROLOGIC: Cranial nerves II through XII are intact. No focal Motor or sensory deficits b/l.   PSYCHIATRIC: The patient is alert and oriented x 3.  SKIN: No obvious rash, lesion, or ulcer.    LABORATORY PANEL:   CBC Recent Labs  Lab 12/02/17 0526  WBC 7.8  HGB 12.6*  HCT 36.5*  PLT 237   ------------------------------------------------------------------------------------------------------------------  Chemistries  Recent Labs  Lab 11/28/17 1934  12/02/17 0526  NA 134*   < > 137  K 3.6   < > 3.9  CL 97*   < > 105  CO2 27   < > 24  GLUCOSE 196*   < > 77  BUN 15   < > 8  CREATININE 0.82   < > 0.58*  CALCIUM 8.9   < > 8.1*  AST 17  --   --   ALT 15*  --   --   ALKPHOS 105  --   --   BILITOT 0.5  --   --    < > = values in this interval not displayed.   ------------------------------------------------------------------------------------------------------------------  Cardiac Enzymes No results for input(s): TROPONINI in the last 168 hours. ------------------------------------------------------------------------------------------------------------------  RADIOLOGY:  [place] Ekg Site Rite  Result Date: 12/02/2017 If Site Rite image not attached, placement could not be confirmed due to current cardiac rhythm.    ASSESSMENT AND PLAN:   57 year old male with past medical history of diabetes, obstructive sleep apnea, peripheral vascular disease, diabetic neuropathy, BPH, osteoarthritis who presented to the hospital due to bilateral lower extremity diabetic wound ulcers.  1.  Bilateral diabetic foot infections- patient had bilateral osteomyelitis in the metatarsals.  He status post surgical debridement of the right fifth toe with partial ray amputation, left fifth metatarsal debridement. - Being followed by vascular surgery, podiatry and also infectious disease. -Patient is  status post PICC line placement and currently on IV Cefepime.  Patient's wound cultures are growing Morganella species. - Patient is also status post left-sided angiogram with angioplasty and plan for right lower extremity angiogram tomorrow. - Patient likely to be discharged on long-term IV antibiotics with 4 to 6 weeks of IV Rocephin.   2.  Diabetes type 2 with PVD/neuroapthy -blood sugar stable. -Continue Lantus, sliding scale insulin.  3.  History of peripheral vascular disease-continue aspirin, Plavix, simvastatin.  4.  Essential hypertension-continue losartan.  5.  Hyperlipidemia-continue simvastatin.  All the records are reviewed and case discussed with Care Management/Social Worker. Management plans discussed with the patient, family and they are in agreement.  CODE STATUS: Full code  DVT Prophylaxis: Lovenox  TOTAL TIME TAKING CARE OF THIS PATIENT: 30 minutes.   POSSIBLE D/C IN 1-2 DAYS, DEPENDING ON CLINICAL CONDITION.   Henreitta Leber M.D on 12/03/2017 at 1:21 PM  Between 7am to 6pm - Pager - [phone]  After 6pm go to [email or web address] - Proofreader  [facility]  Office  [phone]  CC: Primary care physician; Lorelee Market, MD

## 2017-12-03 NOTE — Progress Notes (Signed)
Per PT patient has changed his mind about going home and wants to pursue SNF. Clinical Social Worker (CSW) met with patient and made him aware that [place] will have to approve SNF. Patient is agreeable to SNF search in [facility]. FL2 complete and faxed out.   CSW presented bed offers to patient. He chose [facility]. Per [facility] liaison he will start [facility] authorization today.   [employer], LCSW [phone]

## 2017-12-03 NOTE — Progress Notes (Signed)
[facility]  Daily Progress Note   Subjective  - 2 Days Post-Op  Patient doing well.  Little pain this am   Objective Vitals:   12/02/17 2014 12/02/17 2326 12/03/17 0412 12/03/17 0816  BP: (!) 170/85 (!) 153/83 (!) 157/86 (!) 166/97  Pulse: 85 79 78 88  Resp: 20 16 16 18   Temp: 98 F (36.7 C) 98.6 F (37 C) 98.9 F (37.2 C) 99.4 F (37.4 C)  TempSrc: Oral Oral Oral Oral  SpO2: 100% 100% 92% 96%  Weight:      Height:        Intake/Output Summary (Last 24 hours) at 12/03/2017 0840 Last data filed at 12/03/2017 4742 Gross per 24 hour  Intake 1380 ml  Output 3035.35 ml  Net -1655.35 ml    PULM  CTAB CV  RRR VASC  Feet wrapped.  Warm with good capillary refill  Laboratory CBC    Component Value Date/Time   WBC 7.8 12/02/2017 0526   HGB 12.6 (L) 12/02/2017 0526   HGB 13.0 03/26/2014 2050   HCT 36.5 (L) 12/02/2017 0526   HCT 39.7 (L) 03/26/2014 2050   PLT 237 12/02/2017 0526   PLT 202 03/26/2014 2050    BMET    Component Value Date/Time   NA 137 12/02/2017 0526   NA 134 (L) 03/26/2014 2050   K 3.9 12/02/2017 0526   K 4.3 03/26/2014 2050   CL 105 12/02/2017 0526   CL 103 03/26/2014 2050   CO2 24 12/02/2017 0526   CO2 29 03/26/2014 2050   GLUCOSE 77 12/02/2017 0526   GLUCOSE 150 (H) 03/26/2014 2050   BUN 8 12/02/2017 0526   BUN 18 03/26/2014 2050   CREATININE 0.58 (L) 12/02/2017 0526   CREATININE 0.86 03/26/2014 2050   CALCIUM 8.1 (L) 12/02/2017 0526   CALCIUM 8.7 03/26/2014 2050   GFRNONAA >60 12/02/2017 0526   GFRNONAA >60 03/26/2014 2050   GFRNONAA >60 10/29/2012 1757   GFRAA >60 12/02/2017 0526   GFRAA >60 03/26/2014 2050   GFRAA >60 10/29/2012 1757    Assessment/Planning: POD #2 s/p LLE revascularization   Has RLE tibial disease as well  Will plan right leg angiogram tomorrow  Risks and benefits discussed    Leotis Pain  12/03/2017, 8:40 AM

## 2017-12-04 ENCOUNTER — Encounter
Admission: EM | Disposition: A | Discharge status: Skilled Nursing Facility | Payer: Self-pay | Source: Ambulatory Visit | Attending: Internal Medicine

## 2017-12-04 ENCOUNTER — Encounter: Payer: Self-pay | Admitting: Vascular Surgery

## 2017-12-04 DIAGNOSIS — I70238 Atherosclerosis of native arteries of right leg with ulceration of other part of lower right leg: Secondary | ICD-10-CM

## 2017-12-04 HISTORY — PX: LOWER EXTREMITY ANGIOGRAPHY: CATH118251

## 2017-12-04 LAB — GLUCOSE, CAPILLARY
GLUCOSE-CAPILLARY: 138 mg/dL — AB (ref 65–99)
GLUCOSE-CAPILLARY: 107 mg/dL — AB (ref 65–99)
GLUCOSE-CAPILLARY: 116 mg/dL — AB (ref 65–99)
Glucose-Capillary: 100 mg/dL — ABNORMAL HIGH (ref 65–99)
Glucose-Capillary: 230 mg/dL — ABNORMAL HIGH (ref 65–99)

## 2017-12-04 SURGERY — LOWER EXTREMITY ANGIOGRAPHY
Anesthesia: Moderate Sedation | Laterality: Right

## 2017-12-04 MED ORDER — CEFAZOLIN SODIUM-DEXTROSE 2-4 GM/100ML-% IV SOLN
INTRAVENOUS | Status: AC
  Administered 2017-12-04: 2 g via INTRAVENOUS
  Filled 2017-12-04: qty 100

## 2017-12-04 MED ORDER — HEPARIN SODIUM (PORCINE) 1000 UNIT/ML IJ SOLN
INTRAMUSCULAR | Status: DC | PRN
  Administered 2017-12-04: 4000 [IU] via INTRAVENOUS

## 2017-12-04 MED ORDER — FENTANYL CITRATE (PF) 100 MCG/2ML IJ SOLN
INTRAMUSCULAR | Status: AC
  Filled 2017-12-04: qty 2

## 2017-12-04 MED ORDER — SODIUM CHLORIDE 0.9 % IV SOLN
INTRAVENOUS | Status: DC
  Administered 2017-12-04: via INTRAVENOUS

## 2017-12-04 MED ORDER — IOPAMIDOL (ISOVUE-300) INJECTION 61%
INTRAVENOUS | Status: DC | PRN
  Administered 2017-12-04: 40 mL via INTRA_ARTERIAL

## 2017-12-04 MED ORDER — SODIUM CHLORIDE 0.9 % IV SOLN: INTRAVENOUS | Status: DC

## 2017-12-04 MED ORDER — HEPARIN (PORCINE) IN NACL 1000-0.9 UT/500ML-% IV SOLN
INTRAVENOUS | Status: AC
  Filled 2017-12-04: qty 1000

## 2017-12-04 MED ORDER — MIDAZOLAM HCL 2 MG/2ML IJ SOLN
INTRAMUSCULAR | Status: DC | PRN
  Administered 2017-12-04 (×2): 1 mg via INTRAVENOUS
  Administered 2017-12-04: 2 mg via INTRAVENOUS

## 2017-12-04 MED ORDER — CEFAZOLIN SODIUM-DEXTROSE 2-4 GM/100ML-% IV SOLN
2.0000 g | INTRAVENOUS | Status: AC
  Administered 2017-12-04: 2 g via INTRAVENOUS
  Filled 2017-12-04: qty 100

## 2017-12-04 MED ORDER — MIDAZOLAM HCL 5 MG/5ML IJ SOLN
INTRAMUSCULAR | Status: AC
  Filled 2017-12-04: qty 5

## 2017-12-04 MED ORDER — FENTANYL CITRATE (PF) 100 MCG/2ML IJ SOLN
INTRAMUSCULAR | Status: DC | PRN
  Administered 2017-12-04 (×2): 50 ug via INTRAVENOUS

## 2017-12-04 MED ORDER — LIDOCAINE-EPINEPHRINE (PF) 1 %-1:200000 IJ SOLN
INTRAMUSCULAR | Status: AC
  Filled 2017-12-04: qty 30

## 2017-12-04 MED ORDER — HEPARIN SODIUM (PORCINE) 1000 UNIT/ML IJ SOLN
INTRAMUSCULAR | Status: AC
  Filled 2017-12-04: qty 1

## 2017-12-04 SURGICAL SUPPLY — 19 items
BALLN ULTRVRSE 3X150X150 (BALLOONS) ×2
BALLN ULTRVRSE 3X300X130 (BALLOONS) ×2
BALLN ULTRVRSE 3X300X150 (BALLOONS) ×1
BALLN ULTRVRSE 3X300X150 OTW (BALLOONS) ×1
BALLOON ULTRVRSE 3X150X150 (BALLOONS) ×1 IMPLANT
BALLOON ULTRVRSE 3X300X130 (BALLOONS) ×1 IMPLANT
BALLOON ULTRVRSE 3X300X150 OTW (BALLOONS) ×1 IMPLANT
CATH BEACON 5 .035 65 RIM TIP (CATHETERS) ×2 IMPLANT
CATH BEACON 5 .038 100 VERT TP (CATHETERS) ×2 IMPLANT
COVER PROBE U/S 5X48 (MISCELLANEOUS) ×2 IMPLANT
DEVICE PRESTO INFLATION (MISCELLANEOUS) ×2 IMPLANT
DEVICE STARCLOSE SE CLOSURE (Vascular Products) ×2 IMPLANT
DEVICE TORQUE .025-.038 (MISCELLANEOUS) ×2 IMPLANT
GUIDEWIRE ANGLED .035 180CM (WIRE) ×2 IMPLANT
PACK ANGIOGRAPHY (CUSTOM PROCEDURE TRAY) ×2 IMPLANT
SHEATH BRITE TIP 6FRX11 (SHEATH) ×2 IMPLANT
SHEATH RAABE 6FRX70 (SHEATH) ×2 IMPLANT
WIRE G V18X300CM (WIRE) ×4 IMPLANT
WIRE MAGIC TORQUE 260C (WIRE) ×2 IMPLANT

## 2017-12-04 NOTE — Progress Notes (Signed)
[place] [facility] [place] NAME: [name]    MR#:  [id number]  DATE OF BIRTH:  13-Feb-1961  SUBJECTIVE:   Patient is status post angiogram of the right lower extremity with angioplasty to the right anterior tibial, peroneal and posterior tibial arteries.  Patient denies any pain.  REVIEW OF SYSTEMS:    Review of Systems  Constitutional: Negative for chills and fever.  HENT: Negative for congestion and tinnitus.   Eyes: Negative for blurred vision and double vision.  Respiratory: Negative for cough, shortness of breath and wheezing.   Cardiovascular: Negative for chest pain, orthopnea and PND.  Gastrointestinal: Negative for abdominal pain, diarrhea, nausea and vomiting.  Genitourinary: Negative for dysuria and hematuria.  Neurological: Negative for dizziness, sensory change and focal weakness.  All other systems reviewed and are negative.   Nutrition: Carb control Tolerating Diet: Yes Tolerating PT: Eval noted.    DRUG ALLERGIES:   Allergies  Allergen Reactions  . Bactrim [Sulfamethoxazole-Trimethoprim] Hives    VITALS:  Blood pressure 128/73, pulse 88, temperature 98.1 F (36.7 C), temperature source Oral, resp. rate 17, height 5\' 10"  (1.778 m), weight 111.6 kg (246 lb), SpO2 98 %.  PHYSICAL EXAMINATION:   Physical Exam  GENERAL:  57 y.o.-year-old patient lying in bed in no acute distress.  EYES: Pupils equal, round, reactive to light and accommodation. No scleral icterus. Extraocular muscles intact.  HEENT: Head atraumatic, normocephalic. Oropharynx and nasopharynx clear. NECK:  Supple, no jugular venous distention. No thyroid enlargement, no tenderness.  LUNGS: Normal breath sounds bilaterally, no wheezing, rales, rhonchi. No use of accessory muscles of respiration.  CARDIOVASCULAR: S1, S2 normal. No murmurs, rubs, or gallops.  ABDOMEN: Soft, nontender, nondistended. Bowel sounds present. No organomegaly or mass.   EXTREMITIES: No cyanosis, clubbing or edema b/l. B/l LE wrapped in Kerlex from recent surgery.     NEUROLOGIC: Cranial nerves II through XII are intact. No focal Motor or sensory deficits b/l.   PSYCHIATRIC: The patient is alert and oriented x 3.  SKIN: No obvious rash, lesion, or ulcer.    LABORATORY PANEL:   CBC Recent Labs  Lab 12/02/17 0526  WBC 7.8  HGB 12.6*  HCT 36.5*  PLT 237   ------------------------------------------------------------------------------------------------------------------  Chemistries  Recent Labs  Lab 11/28/17 1934  12/02/17 0526  NA 134*   < > 137  K 3.6   < > 3.9  CL 97*   < > 105  CO2 27   < > 24  GLUCOSE 196*   < > 77  BUN 15   < > 8  CREATININE 0.82   < > 0.58*  CALCIUM 8.9   < > 8.1*  AST 17  --   --   ALT 15*  --   --   ALKPHOS 105  --   --   BILITOT 0.5  --   --    < > = values in this interval not displayed.   ------------------------------------------------------------------------------------------------------------------  Cardiac Enzymes No results for input(s): TROPONINI in the last 168 hours. ------------------------------------------------------------------------------------------------------------------  RADIOLOGY:  [place] Ekg Site Rite  Result Date: 12/02/2017 If Site Rite image not attached, placement could not be confirmed due to current cardiac rhythm.    ASSESSMENT AND PLAN:   57 year old male with past medical history of diabetes, obstructive sleep apnea, peripheral vascular disease, diabetic neuropathy, BPH, osteoarthritis who presented to the hospital due to bilateral lower extremity diabetic wound ulcers.  1.  Bilateral diabetic foot  infections- patient had bilateral osteomyelitis in the metatarsals.  He status post surgical debridement of the right fifth toe with partial ray amputation,  And also left fifth metatarsal debridement. - Being followed by vascular surgery, podiatry and also infectious  disease. -Patient is status post PICC line placement and now on IV Ceftriaxone.  Patient's wound cultures are growing Morganella species. - Patient is also status post left-sided angiogram with angioplasty and now today s/p Angiogram and angioplasty to Right Tibial, Peroneal and posterior tibial artery.  - possible discharge to rehab tomorrow on IV Rocephin.    2.  Diabetes type 2 with PVD/neuroapthy -blood sugar stable. -Continue Lantus, sliding scale insulin.  3.  History of peripheral vascular disease-continue aspirin, Plavix, simvastatin.  4.  Essential hypertension-continue losartan.  5.  Hyperlipidemia-continue simvastatin.  All the records are reviewed and case discussed with Care Management/Social Worker. Management plans discussed with the patient, family and they are in agreement.  CODE STATUS: Full code  DVT Prophylaxis: Lovenox  TOTAL TIME TAKING CARE OF THIS PATIENT: 30 minutes.   POSSIBLE D/C IN 1-2 DAYS, DEPENDING ON CLINICAL CONDITION.   Henreitta Leber M.D on 12/04/2017 at 2:07 PM  Between 7am to 6pm - Pager - [phone]  After 6pm go to [email or web address] - Proofreader  [facility]  Office  [phone]  CC: Primary care physician; Lorelee Market, MD

## 2017-12-04 NOTE — Progress Notes (Signed)
Per [facility] liaison [facility] SNF authorization has been received. Patient is aware of above.   [employer], LCSW [phone]

## 2017-12-04 NOTE — Op Note (Signed)
[name]  Percutaneous Study/Intervention Procedural Note   Date of Surgery: 12/04/2017  Surgeon(s):Taliana Mersereau    Assistants:none  Pre-operative Diagnosis: PAD with ulceration and infection bilateral lower extremities  Post-operative diagnosis:  Same  Procedure(s) Performed:             1.  Ultrasound guidance for vascular access left femoral artery             2.  Catheter placement into right anterior tibial artery, peroneal artery, and posterior tibial arteries from left femoral approach             3.  Selective right lower extremity angiogram             4.  Percutaneous transluminal angioplasty of entire right anterior tibial artery with 2 inflations with a 3 mm diameter by 30 cm length angioplasty balloon             5.   Percutaneous transluminal angioplasty of the right proximal peroneal artery with 3 mm diameter by 15 cm length angioplasty balloon  6.  Percutaneous transluminal angioplasty of the right mid posterior tibial artery with 3 mm diameter by 15 cm length angioplasty balloon             7.  StarClose closure device left femoral artery  EBL: 15 cc  Contrast: 40 cc  Fluoro Time: 5.9 minutes  Moderate Conscious Sedation Time: approximately 40 minutes using 4 mg of Versed and 100 Mcg of Fentanyl              Indications:  Patient is a 57 y.o.male with infection and ulceration of both lower extremities.  He has already undergone left lower extremity revascularization as well as imaging on the right lower extremity which demonstrated tibial disease. The patient is brought in for angiography for further evaluation and potential treatment.  Due to the limb threatening nature of the situation, angiogram was performed for attempted limb salvage. The patient is aware that if the procedure fails, amputation would be expected.  The patient also understands that even with successful revascularization, amputation may still be required due to the severity of  the situation.  Risks and benefits are discussed and informed consent is obtained.   Procedure:  The patient was identified and appropriate procedural time out was performed.  The patient was then placed supine on the table and prepped and draped in the usual sterile fashion. Moderate conscious sedation was administered during a face to face encounter with the patient throughout the procedure with my supervision of the RN administering medicines and monitoring the patient's vital signs, pulse oximetry, telemetry and mental status throughout from the start of the procedure until the patient was taken to the recovery room. Ultrasound was used to evaluate the left common femoral artery.  It was patent .  A digital ultrasound image was acquired.  A Seldinger needle was used to access the left common femoral artery under direct ultrasound guidance and a permanent image was performed.  A 0.035 J wire was advanced without resistance and a 5Fr sheath was placed.   Aortogram was not necessary as one had been recently performed and was normal so a rim catheter and a Glidewire were then used. I then crossed the aortic bifurcation and advanced to the right femoral head and then down into the right superficial femoral artery. Selective right lower extremity angiogram was then performed. This demonstrated the right SFA and popliteal arteries with mild <25% stenosis.  There was a true tibial trifurcation.  The right anterior tibial artery is diffusely diseased with what appeared to be an occlusion in the mid segment and stenosis both in the proximal and distal segment greater than 80%.  The peroneal artery had about an 80 to 90% stenosis in the proximal segment which was reasonably focal.  The right posterior tibial artery was the best runoff to the foot but had about a 60 to 70% stenosis in the mid segment. The patient was systemically heparinized and a 6 French 70 cm sheath was then placed over the Magic torque wire and parked  in the distal right SFA. I then used a Kumpe catheter and the V 18 wire to navigate into the anterior tibial artery and cross the occlusions.  The wire was parked all the way into the foot.  I then used a 3 mm diameter by 30 cm length angioplasty balloon and treated from the ankle up to the proximal anterior tibial artery.  A second inflation was required to treat the remainder of the proximal anterior tibial artery and retreat the mid anterior tibial artery where the disease was worse.  These inflations were 12 to 14 atm for 1 minute.  Completion angiogram showed about a 30 to 40% residual stenosis with the flow was now continuous into the foot.  I then turned my attention to the peroneal artery.  Using the Kumpe catheter and the V 18 wire was able to cross the high-grade stenosis in the proximal segment.  I then used a 3 mm diameter by 15 cm length angioplasty balloon inflated to 10 atm for 1 minute and the proximal peroneal artery.  Completion angiogram showed only about 20% residual stenosis.  I then turned my attention to the posterior tibial artery.  The Kumpe catheter and the V 18 wire were used to get into the posterior tibial artery and across the stenosis in the midsegment.  I then used the 3 mm diameter by 15 cm length angioplasty balloon and inflated the mid posterior tibial artery stenosis up to 6 atm for 1 minute.  Completion angiogram showed the area of stenosis to be about 30% residual with some spasm just below where the balloon was treated, but continuous flow distally. I elected to terminate the procedure. The sheath was removed and StarClose closure device was deployed in the left femoral artery with excellent hemostatic result. The patient was taken to the recovery room in stable condition having tolerated the procedure well.  Findings:                       Right Lower Extremity:  SFA and popliteal arteries with mild <25% stenosis.  There was a true tibial trifurcation.  The anterior tibial  artery is diffusely diseased with what appeared to be an occlusion in the mid segment and stenosis both in the proximal and distal segment greater than 80%.  The peroneal artery had about an 80 to 90% stenosis in the proximal segment which was reasonably focal.  The posterior tibial artery was the best runoff to the foot but had about a 60 to 70% stenosis in the mid segment.   Disposition: Patient was taken to the recovery room in stable condition having tolerated the procedure well.  Complications: None  Leotis Pain 12/04/2017 12:19 PM   This note was created with Dragon Medical transcription system. Any errors in dictation are purely unintentional.

## 2017-12-04 NOTE — Progress Notes (Signed)
PT Cancellation Note  Patient Details Name: [name] MRN: [id number] DOB: [birth year]   Cancelled Treatment:    Reason Eval/Treat Not Completed: Patient at procedure or test/unavailable.  Pt currently off floor for procedure.  Will re-attempt PT treatment at a later date/time.  Leitha Bleak, PT 12/04/17, 11:04 AM [phone]

## 2017-12-04 NOTE — Progress Notes (Signed)
Npo after midnight. Pt own cpap on for sleep. Call bell in reach.

## 2017-12-04 NOTE — Progress Notes (Signed)
PT Cancellation Note  Patient Details Name: [name] MRN: [id number] DOB: 30-Sep-1960   Cancelled Treatment:    Reason Eval/Treat Not Completed: Patient not medically ready.  Pt currently with orders for bedrest (s/p procedure today).  Will re-attempt PT treatment session at a later date/time as medically appropriate.   Leitha Bleak, PT 12/04/17, 2:16 PM [phone]

## 2017-12-04 NOTE — H&P (Signed)
[facility] History & Physical Update  The patient was interviewed and re-examined.  The patient's previous History and Physical has been reviewed and is unchanged.  There is no change in the plan of care. We plan to proceed with the scheduled procedure.  Leotis Pain, MD  12/04/2017, 11:06 AM

## 2017-12-04 NOTE — Progress Notes (Signed)
Day of Surgery   Subjective/Chief Complaint: Patient seen.  Overall doing well.  Pain is manageable and he is still doing well getting up on the feet for his bathroom privileges.    Objective: Vital signs in last 24 hours: Temp:  [97.9 F (36.6 C)-98.4 F (36.9 C)] 98.1 F (36.7 C) (06/06 1038) Pulse Rate:  [80-93] 88 (06/06 1326) Resp:  [14-21] 17 (06/06 1326) BP: (127-156)/(73-102) 128/73 (06/06 1326) SpO2:  [94 %-100 %] 98 % (06/06 1326) Weight:  [111.6 kg (246 lb)] 111.6 kg (246 lb) (06/06 1038) Last BM Date: 12/03/17  Intake/Output from previous day: 06/05 0701 - 06/06 0700 In: 784.7 [P.O.:540; I.V.:44.7; IV Piggyback:200] Out: 1300.4 [Urine:1300.4] Intake/Output this shift: Total I/O In: 0  Out: 1300 [Urine:1300]  Moderate bleeding is noted on the bandaging, more on the left than on the right.  Upon removal the incisions are coapted with minimal drainage on the bandaging.  Erythema and edema continues to significantly improve in both feet.  Lab Results:  Recent Labs    12/02/17 0526  WBC 7.8  HGB 12.6*  HCT 36.5*  PLT 237   BMET Recent Labs    12/02/17 0526  NA 137  K 3.9  CL 105  CO2 24  GLUCOSE 77  BUN 8  CREATININE 0.58*  CALCIUM 8.1*   PT/INR No results for input(s): LABPROT, INR in the last 72 hours. ABG No results for input(s): PHART, HCO3 in the last 72 hours.  Invalid input(s): PCO2, PO2  Studies/Results: No results found.  Anti-infectives: Anti-infectives (From admission, onward)   Start     Dose/Rate Route Frequency Ordered Stop   12/04/17 0057  ceFAZolin (ANCEF) IVPB 2g/100 mL premix     2 g 200 mL/hr over 30 Minutes Intravenous 30 min pre-op 12/04/17 0057 12/04/17 1200   12/03/17 2237  ceFAZolin (ANCEF) IVPB 2g/100 mL premix  Status:  Discontinued     2 g 200 mL/hr over 30 Minutes Intravenous 30 min pre-op 12/03/17 2237 12/04/17 1031   12/03/17 1900  cefTRIAXone (ROCEPHIN) 2 g in sodium chloride 0.9 % 100 mL IVPB     2 g 200  mL/hr over 30 Minutes Intravenous Every 24 hours 12/03/17 1511     12/02/17 0000  vancomycin (VANCOCIN) 1,500 mg in sodium chloride 0.9 % 500 mL IVPB  Status:  Discontinued     1,500 mg 250 mL/hr over 120 Minutes Intravenous Every 8 hours 12/01/17 1643 12/02/17 1403   12/01/17 0827  ceFAZolin (ANCEF) IVPB 2g/100 mL premix     2 g 200 mL/hr over 30 Minutes Intravenous 30 min pre-op 12/01/17 0827 12/01/17 1339   11/29/17 0800  vancomycin (VANCOCIN) 1,250 mg in sodium chloride 0.9 % 250 mL IVPB  Status:  Discontinued     1,250 mg 166.7 mL/hr over 90 Minutes Intravenous Every 8 hours 11/29/17 0135 12/01/17 1643   11/29/17 0600  ceFEPIme (MAXIPIME) 2 g in sodium chloride 0.9 % 100 mL IVPB  Status:  Discontinued     2 g 200 mL/hr over 30 Minutes Intravenous Every 8 hours 11/29/17 0136 12/03/17 1511   11/29/17 0100  vancomycin (VANCOCIN) IVPB 1000 mg/200 mL premix     1,000 mg 200 mL/hr over 60 Minutes Intravenous STAT 11/29/17 0050 11/29/17 0220   11/28/17 2345  clindamycin (CLEOCIN) IVPB 900 mg  Status:  Discontinued     900 mg 100 mL/hr over 30 Minutes Intravenous  Once 11/28/17 2336 11/28/17 2342   11/28/17 2345  ceFEPIme (MAXIPIME)  1 g in sodium chloride 0.9 % 100 mL IVPB     1 g 200 mL/hr over 30 Minutes Intravenous  Once 11/28/17 2342 11/29/17 0111      Assessment/Plan: s/p Procedure(s): Lower Extremity Angiography (Right) Assessment: Stable status post debridement osteomyelitis bilateral.   Plan: Dry sterile dressings reapplied to both feet.  From a foot standpoint the patient should be stable for discharge.  Recommend dry sterile dressing changes to both feet 3 times a week at skilled nursing.  I will follow-up with the patient outpatient next week  LOS: 5 days    Durward Fortes 12/04/2017

## 2017-12-04 NOTE — Progress Notes (Signed)
Infectious Disease Long Term IV Antibiotic Orders [name] [birth year]  Diagnosis: DM foot infection, osteo   Culture results Results for orders placed or performed during the hospital encounter of 11/28/17  Aerobic/Anaerobic Culture (surgical/deep wound)     Status: None (Preliminary result)   Collection Time: 11/30/17 11:27 AM  Result Value Ref Range Status   Specimen Description   Final    BONE RIGHT FOOT Performed at [facility], [address] [place], [place] [address]    Special Requests   Final    NONE Performed at [facility], [place], [place] [address]    Gram Stain NO WBC SEEN NO ORGANISMS SEEN   Final   Culture   Final    RARE MORGANELLA MORGANII CRITICAL RESULT CALLED TO, READ BACK BY AND VERIFIED WITH: E GANNON,RN AT 1206 12/01/17 BY L BENFIELD CONCERNING GROWTH ON CULTURE Performed at [facility], [place] [address] [place], [place] [address]    Report Status PENDING  Incomplete   Organism ID, Bacteria MORGANELLA MORGANII  Final      Susceptibility   Morganella morganii - MIC*    AMPICILLIN >=32 RESISTANT Resistant     CEFAZOLIN >=64 RESISTANT Resistant     CEFEPIME <=1 SENSITIVE Sensitive     CEFTAZIDIME <=1 SENSITIVE Sensitive     CEFTRIAXONE <=1 SENSITIVE Sensitive     CIPROFLOXACIN <=0.25 SENSITIVE Sensitive     GENTAMICIN <=1 SENSITIVE Sensitive     IMIPENEM 4 SENSITIVE Sensitive     TRIMETH/SULFA <=20 SENSITIVE Sensitive     AMPICILLIN/SULBACTAM 16 INTERMEDIATE Intermediate     PIP/TAZO <=4 SENSITIVE Sensitive     * RARE MORGANELLA MORGANII  Aerobic/Anaerobic Culture (surgical/deep wound)     Status: None (Preliminary result)   Collection Time: 11/30/17 12:02 PM  Result Value Ref Range Status   Specimen Description BONE LEFT FOOT  Final   Special Requests MAXIPIME VANCOMYCIN  Final   Gram Stain   Final    RARE WBC PRESENT, PREDOMINANTLY PMN NO ORGANISMS SEEN    Culture   Final    RARE STREPTOCOCCUS  AGALACTIAE TESTING AGAINST S. AGALACTIAE NOT ROUTINELY PERFORMED DUE TO PREDICTABILITY OF AMP/PEN/VAN SUSCEPTIBILITY. RARE MORGANELLA MORGANII SUSCEPTIBILITIES PERFORMED ON PREVIOUS CULTURE WITHIN THE LAST 5 DAYS. CRITICAL RESULT CALLED TO, READ BACK BY AND VERIFIED WITH: E GANNON AT 1206 12/01/17 BY L BENFIELD CONCERNING GROWTH ON CULTURE Performed at [facility], [place] [address], [place], [place] [address]    Report Status PENDING  Incomplete    LABS Lab Results  Component Value Date   CREATININE 0.58 (L) 12/02/2017   Lab Results  Component Value Date   WBC 7.8 12/02/2017   HGB 12.6 (L) 12/02/2017   HCT 36.5 (L) 12/02/2017   MCV 93.4 12/02/2017   PLT 237 12/02/2017   Lab Results  Component Value Date   ESRSEDRATE 74 (H) 11/28/2017   Lab Results  Component Value Date   CRP 21.5 (H) 03/12/2016    Allergies:  Allergies  Allergen Reactions  . Bactrim [Sulfamethoxazole-Trimethoprim] Hives    Discharge antibiotics  Ceftriaxone 2 grams every         24      hours   PICC Care per protocol Labs weekly while on IV antibiotics -FAX weekly labs to [phone] CBC w diff   lfts  Planned duration of antibiotics  4 weeks then 2 weeks oral   Stop date 6/30  Follow up clinic  date 3 weeks    Leonel Ramsay, MD

## 2017-12-04 NOTE — Progress Notes (Signed)
sleeping on rounds. cpap on.

## 2017-12-05 ENCOUNTER — Ambulatory Visit: Payer: BLUE CROSS/BLUE SHIELD | Admitting: Nurse Practitioner

## 2017-12-05 LAB — GLUCOSE, CAPILLARY: GLUCOSE-CAPILLARY: 98 mg/dL (ref 65–99)

## 2017-12-05 LAB — CREATININE, SERUM
Creatinine, Ser: 0.67 mg/dL (ref 0.61–1.24)
GFR calc Af Amer: >60 mL/min (ref >60)

## 2017-12-05 LAB — CBC
HEMATOCRIT: 38.9 % — AB (ref 40.0–52.0)
HEMOGLOBIN: 13.3 g/dL (ref 13.0–18.0)
MCH: 31.9 pg (ref 26.0–34.0)
MCHC: 34.3 g/dL (ref 32.0–36.0)
MCV: 93 fL (ref 80.0–100.0)
Platelets: 250 10*3/uL (ref 150–440)
RBC: 4.18 MIL/uL — AB (ref 4.40–5.90)
RDW: 13.3 % (ref 11.5–14.5)
WBC: 7.2 10*3/uL (ref 3.8–10.6)

## 2017-12-05 MED ORDER — CEFTRIAXONE IV (FOR PTA / DISCHARGE USE ONLY): 2.0000 g | INTRAVENOUS | 0 refills | Status: AC

## 2017-12-05 MED ORDER — OXYCODONE-ACETAMINOPHEN 5-325 MG PO TABS: 1.0000 | ORAL_TABLET | ORAL | 0 refills | Status: DC | PRN

## 2017-12-05 MED ORDER — CLOPIDOGREL BISULFATE 75 MG PO TABS: 75.0000 mg | ORAL_TABLET | Freq: Every day | ORAL | Status: DC

## 2017-12-05 MED ORDER — INSULIN ASPART 100 UNIT/ML ~~LOC~~ SOLN: 0.0000 [IU] | Freq: Three times a day (TID) | SUBCUTANEOUS | 11 refills | Status: DC

## 2017-12-05 NOTE — Clinical Social Work Placement (Signed)
   CLINICAL SOCIAL WORK PLACEMENT  NOTE  Date:  12/05/2017  Patient Details  Name: [name] MRN: [id number] Date of Birth: 1960/12/24  Clinical Social Work is seeking post-discharge placement for this patient at the [facility] level of care (*CSW will initial, date and re-position this form in  chart as items are completed):  Yes   Patient/family provided with [place] Work Department's list of facilities offering this level of care within the geographic area requested by the patient (or if unable, by the patient's family).  Yes   Patient/family informed of their freedom to choose among providers that offer the needed level of care, that participate in Medicare, Medicaid or managed care program needed by the patient, have an available bed and are willing to accept the patient.  Yes   Patient/family informed of [employer]'s ownership interest in [facility] and [facility], as well as of the fact that they are under no obligation to receive care at these facilities.  PASRR submitted to EDS on 12/02/17     PASRR number received on 12/02/17     Existing PASRR number confirmed on       FL2 transmitted to all facilities in geographic area requested by pt/family on 12/03/17     FL2 transmitted to all facilities within larger geographic area on       Patient informed that his/her managed care company has contracts with or will negotiate with certain facilities, including the following:        Yes   Patient/family informed of bed offers received.  Patient chooses bed at ([facility] )     Physician recommends and patient chooses bed at      Patient to be transferred to ([facility] ) on 12/05/17.  Patient to be transferred to facility by [facility] )     Patient family notified on 12/05/17 of transfer.  Name of family member notified:  (Patient's wife [name] is aware of D/C today. )     PHYSICIAN       Additional Comment:     _______________________________________________ Ednamae Schiano, Veronia Beets, LCSW 12/05/2017, 11:02 AM

## 2017-12-05 NOTE — Progress Notes (Signed)
Patient is medically stable for D/C to [facility] today. Per [facility] authorization has been received and patient can come today to room 601. RN will call report and arrange EMS for transport. Clinical Education officer, museum (CSW) sent D/C orders to [facility] via [facility]. Patient is aware of above. CSW contacted patient's wife Vern and made her aware of above. Please reconsult if future social work needs arise. CSW signing off.   [employer], LCSW [phone]

## 2017-12-05 NOTE — Progress Notes (Signed)
Called report to facility, [facility]big[facility]ts, [place].  EMS called for transport, en route.

## 2017-12-05 NOTE — Discharge Summary (Addendum)
[place] [facility] [place] NAME: [name]    MR#:  [id number]  DATE OF BIRTH:  [birth year]  DATE OF ADMISSION:  11/28/2017 ADMITTING PHYSICIAN: Lance Coon, MD  DATE OF DISCHARGE: 12/05/2017  PRIMARY CARE PHYSICIAN: Lorelee Market, MD    ADMISSION DIAGNOSIS:  Wound infection [T14.8XXA, L08.9]  DISCHARGE DIAGNOSIS:  Principal Problem:   Diabetic foot infection ([place]) Active Problems:   Diabetes mellitus with diabetic polyneuropathy (H[facility])   HTN (hypertension)   HLD (hyperlipidemia)   Osteomyelitis ([place])   SECONDARY DIAGNOSIS:   Past Medical History:  Diagnosis Date  . Arthritis   . BPH (benign prostatic hypertrophy)   . Diabetes mellitus   . Diabetic neuropathy ([place])   . Hypertension   . Metatarsal bone fracture    left 5th toe  . Osteomyelitis of toe of right foot ([place])   . Post-operative infection    and diabetic ulcer left foot  . Sleep apnea     does not wear CPAP  . Wears glasses     HOSPITAL COURSE:   57 year old male with past medical history of diabetes, obstructive sleep apnea, peripheral vascular disease, diabetic neuropathy, BPH, osteoarthritis who presented to the hospital due to bilateral lower extremity diabetic wound ulcers.  1.  Bilateral diabetic foot infections- patient had bilateral osteomyelitis in the metatarsals.  He is status post surgical debridement of the right fifth toe with partial ray amputation,  also left fifth metatarsal debridement. -Initially patient was treated with broad-spectrum IV antibiotics with vancomycin, cefepime.  Patient's wound cultures grew out Morganella species.  Antibiotics were narrowed down to just cefepime and then eventually to IV ceftriaxone.  Patient is now being discharged with a PICC line on IV ceftriaxone for another 4 to 6 weeks.  The last day of IV antibiotics is December 28, 2017.  This is as per recommendations from infectious disease Dr. Ola Spurr. - Podiatry  Recommends dry sterile dressing changes to both feet 3 times a week at skilled nursing. -Patient will follow-up with podiatry next week.   2.  Peripheral vascular disease-due to his bilateral diabetic foot ulcers a vascular surgery so consult was obtained to assess his blood flow to his lower extremities.  Patient was seen by vascular surgery and is status post left-sided angiogram with angioplasty and now today s/p Angiogram and angioplasty to Right Tibial, Peroneal and posterior tibial artery.  -Patient will cont. ASA, Plavix, Simvastatin.   3.  Diabetes type 2 with PVD/neuroapthy -while in the hospital patient was on Lantus, sliding scale insulin. - pt. Will resume his Metformin, Farxiga, Novolog with meals upon discharge.  - further adjustments to his DM regimen can be done as outpatient.  4.  Essential hypertension- pt. Will continue losartan.  5.  Hyperlipidemia- pt. Will continue simvastatin.  Patient is being discharged to a skilled nursing facility for ongoing care.   DISCHARGE CONDITIONS:   Stable  CONSULTS OBTAINED:  Treatment Team:  Sharlotte Alamo, DPM Murray Hodgkins, MD Leonel Ramsay, MD  DRUG ALLERGIES:   Allergies  Allergen Reactions  . Bactrim [Sulfamethoxazole-Trimethoprim] Hives    DISCHARGE MEDICATIONS:   Allergies as of 12/05/2017      Reactions   Bactrim [sulfamethoxazole-trimethoprim] Hives      Medication List    STOP taking these medications   doxycycline 100 MG capsule Commonly known as:  MONODOX   insulin lispro 100 UNIT/ML KiwkPen Commonly known as:  HUMALOG KWIKPEN   oxyCODONE 5 MG immediate  release tablet Commonly known as:  Oxy IR/ROXICODONE   senna 8.6 MG Tabs tablet Commonly known as:  SENOKOT   traMADol 50 MG tablet Commonly known as:  ULTRAM     TAKE these medications   aspirin EC 81 MG tablet Take 81 mg by mouth daily.   BASAGLAR KWIKPEN 100 UNIT/ML Sopn Inject 0.35 mLs (35 Units total) into the skin at  bedtime.   cefTRIAXone IVPB Commonly known as:  ROCEPHIN Inject 2 g into the vein daily for 25 days. Indication:  Osteomyelitis  Last Day of Therapy:  12/28/17 Labs - Once weekly:  CBC/D and BMP, Labs - Every other week:  ESR and CRP Please fax labs to [phone]--Do NOT pull PICC   clopidogrel 75 MG tablet Commonly known as:  PLAVIX Take 1 tablet (75 mg total) by mouth daily. Start taking on:  12/06/2017   docusate sodium 100 MG capsule Commonly known as:  COLACE Take 1 capsule (100 mg total) by mouth 2 (two) times daily. While taking narcotic pain medicine.   FARXIGA 10 MG Tabs tablet Generic drug:  dapagliflozin propanediol Take 1 tablet by mouth daily.   insulin aspart 100 UNIT/ML injection Commonly known as:  novoLOG Inject 0-15 Units into the skin 4 (four) times daily -  before meals and at bedtime. CBG 70 - 120: 0 units CBG 121 - 150: 2 units CBG 151 - 200: 3 units CBG 201 - 250: 5 units CBG 251 - 300: 8 units CBG 301 - 350: 11 units CBG 351 - 400: 15 units CBG > 400: call MD What changed:    how much to take  when to take this  additional instructions   losartan 50 MG tablet Commonly known as:  COZAAR Take 50 mg by mouth daily.   metFORMIN 1000 MG tablet Commonly known as:  GLUCOPHAGE Take 1,000 mg by mouth 2 (two) times daily.   oxyCODONE-acetaminophen 5-325 MG tablet Commonly known as:  PERCOCET/ROXICET Take 1-2 tablets by mouth every 4 (four) hours as needed for severe pain.   simvastatin 20 MG tablet Commonly known as:  ZOCOR TAKE 1 TABLET BY MOUTH IN THE EVENING            Home Infusion Instuctions  (From admission, onward)        Start     Ordered   12/05/17 0000  Home infusion instructions [facility] May follow [address] Dosing Protocol; May administer Cathflo as needed to maintain patency of vascular access device.; Flushing of vascular access device: per [facility] Protocol: 0.9% NaCl pre/post medica...    Question Answer  Comment  Instructions May follow [place] Dosing Protocol   Instructions May administer Cathflo as needed to maintain patency of vascular access device.   Instructions Flushing of vascular access device: per [facility] Protocol: 0.9% NaCl pre/post medication administration and prn patency; Heparin 100 u/ml, 83m for implanted ports and Heparin 10u/ml, 567mfor all other central venous catheters.   Instructions May follow [facility] Anaphylaxis Protocol for First Dose Administration in the home: 0.9% NaCl at 25-50 ml/hr to maintain IV access for protocol meds. Epinephrine 0.3 ml IV/IM PRN and Benadryl 25-50 IV/IM PRN s/s of anaphylaxis.   Instructions [facility] Infusion Coordinator (RN) to assist per patient IV care needs in the home PRN.      12/05/17 1023        DISCHARGE INSTRUCTIONS:   DIET:  Cardiac diet and Diabetic diet  DISCHARGE CONDITION:  Stable  ACTIVITY:  Activity as tolerated  OXYGEN:  Home Oxygen: No.   Oxygen Delivery: room air  DISCHARGE LOCATION:  nursing home   If you experience worsening of your admission symptoms, develop shortness of breath, life threatening emergency, suicidal or homicidal thoughts you must seek medical attention immediately by calling 911 or calling your MD immediately  if symptoms less severe.  You Must read complete instructions/literature along with all the possible adverse reactions/side effects for all the Medicines you take and that have been prescribed to you. Take any new Medicines after you have completely understood and accpet all the possible adverse reactions/side effects.   Please note  You were cared for by a hospitalist during your hospital stay. If you have any questions about your discharge medications or the care you received while you were in the hospital after you are discharged, you can call the unit and asked to speak with the hospitalist on call if the hospitalist that took care of you is not available. Once you are  discharged, your primary care physician will handle any further medical issues. Please note that NO REFILLS for any discharge medications will be authorized once you are discharged, as it is imperative that you return to your primary care physician (or establish a relationship with a primary care physician if you do not have one) for your aftercare needs so that they can reassess your need for medications and monitor your lab values.     Today   No acute events overnight. Pt. Denies any new complaints.   VITAL SIGNS:  Blood pressure (!) 166/92, pulse 92, temperature 98.2 F (36.8 C), temperature source Oral, resp. rate 18, height '5\' 10"'$  (1.778 m), weight 111.6 kg (246 lb), SpO2 100 %.  I/O:    Intake/Output Summary (Last 24 hours) at 12/05/2017 1359 Last data filed at 12/05/2017 0828 Gross per 24 hour  Intake 880 ml  Output 750 ml  Net 130 ml    PHYSICAL EXAMINATION:   GENERAL:  57 y.o.-year-old patient lying in bed in no acute distress.  EYES: Pupils equal, round, reactive to light and accommodation. No scleral icterus. Extraocular muscles intact.  HEENT: Head atraumatic, normocephalic. Oropharynx and nasopharynx clear. NECK:  Supple, no jugular venous distention. No thyroid enlargement, no tenderness.  LUNGS: Normal breath sounds bilaterally, no wheezing, rales, rhonchi. No use of accessory muscles of respiration.  CARDIOVASCULAR: S1, S2 normal. No murmurs, rubs, or gallops.  ABDOMEN: Soft, nontender, nondistended. Bowel sounds present. No organomegaly or mass.  EXTREMITIES: No cyanosis, clubbing or edema b/l. B/l LE wrapped in Kerlex from recent surgery.     NEUROLOGIC: Cranial nerves II through XII are intact. No focal Motor or sensory deficits b/l.   PSYCHIATRIC: The patient is alert and oriented x 3.  SKIN: No obvious rash, lesion, or ulcer.   DATA REVIEW:   CBC Recent Labs  Lab 12/05/17 0441  WBC 7.2  HGB 13.3  HCT 38.9*  PLT 250    Chemistries  Recent Labs   Lab 11/28/17 1934  12/02/17 0526 12/05/17 0441  NA 134*   < > 137  --   K 3.6   < > 3.9  --   CL 97*   < > 105  --   CO2 27   < > 24  --   GLUCOSE 196*   < > 77  --   BUN 15   < > 8  --   CREATININE 0.82   < > 0.58* 0.67  CALCIUM  8.9   < > 8.1*  --   AST 17  --   --   --   ALT 15*  --   --   --   ALKPHOS 105  --   --   --   BILITOT 0.5  --   --   --    < > = values in this interval not displayed.    Cardiac Enzymes No results for input(s): TROPONINI in the last 168 hours.  Microbiology Results  Results for orders placed or performed during the hospital encounter of 11/28/17  Aerobic/Anaerobic Culture (surgical/deep wound)     Status: None (Preliminary result)   Collection Time: 11/30/17 11:27 AM  Result Value Ref Range Status   Specimen Description   Final    BONE RIGHT FOOT Performed at [facility], [address] [place], [place] [address]    Special Requests   Final    NONE Performed at [facility], [place] [place], [place] [address]    Gram Stain   Final    NO WBC SEEN NO ORGANISMS SEEN Performed at [facility], [place] [address], [place], [place] [address]    Culture   Final    RARE MORGANELLA MORGANII CRITICAL RESULT CALLED TO, READ BACK BY AND VERIFIED WITH: E GANNON,RN AT 1206 12/01/17 BY L BENFIELD CONCERNING GROWTH ON CULTURE NO ANAEROBES ISOLATED; CULTURE IN PROGRESS FOR 5 DAYS    Report Status PENDING  Incomplete   Organism ID, Bacteria MORGANELLA MORGANII  Final      Susceptibility   Morganella morganii - MIC*    AMPICILLIN >=32 RESISTANT Resistant     CEFAZOLIN >=64 RESISTANT Resistant     CEFEPIME <=1 SENSITIVE Sensitive     CEFTAZIDIME <=1 SENSITIVE Sensitive     CEFTRIAXONE <=1 SENSITIVE Sensitive     CIPROFLOXACIN <=0.25 SENSITIVE Sensitive     GENTAMICIN <=1 SENSITIVE Sensitive     IMIPENEM 4 SENSITIVE Sensitive     TRIMETH/SULFA <=20 SENSITIVE Sensitive     AMPICILLIN/SULBACTAM 16 INTERMEDIATE Intermediate      PIP/TAZO <=4 SENSITIVE Sensitive     * RARE MORGANELLA MORGANII  Aerobic/Anaerobic Culture (surgical/deep wound)     Status: None (Preliminary result)   Collection Time: 11/30/17 12:02 PM  Result Value Ref Range Status   Specimen Description BONE LEFT FOOT  Final   Special Requests MAXIPIME VANCOMYCIN  Final   Gram Stain   Final    RARE WBC PRESENT, PREDOMINANTLY PMN NO ORGANISMS SEEN    Culture   Final    RARE STREPTOCOCCUS AGALACTIAE TESTING AGAINST S. AGALACTIAE NOT ROUTINELY PERFORMED DUE TO PREDICTABILITY OF AMP/PEN/VAN SUSCEPTIBILITY. RARE MORGANELLA MORGANII SUSCEPTIBILITIES PERFORMED ON PREVIOUS CULTURE WITHIN THE LAST 5 DAYS. CRITICAL RESULT CALLED TO, READ BACK BY AND VERIFIED WITH: E GANNON AT 1206 12/01/17 BY L BENFIELD CONCERNING GROWTH ON CULTURE Performed at [facility], [place] [address], [place], [place] [address]    Report Status PENDING  Incomplete    RADIOLOGY:  No results found.    Management plans discussed with the patient, family and they are in agreement.  CODE STATUS:     Code Status Orders  (From admission, onward)        Start     Ordered   11/29/17 0218  Full code  Continuous     11/29/17 0217     TOTAL TIME TAKING CARE OF THIS PATIENT: 40 minutes.    Henreitta Leber  M.D on 12/05/2017 at 1:59 PM  Between 7am to 6pm - Pager - [phone]  After 6pm go to [email or web address] - Proofreader  [facility]  Office  [phone]  CC: Primary care physician; Lorelee Market, MD

## 2017-12-06 LAB — AEROBIC/ANAEROBIC CULTURE W GRAM STAIN (SURGICAL/DEEP WOUND): Gram Stain: NONE SEEN

## 2017-12-06 LAB — AEROBIC/ANAEROBIC CULTURE (SURGICAL/DEEP WOUND)

## 2017-12-25 ENCOUNTER — Telehealth: Payer: Self-pay | Admitting: *Deleted

## 2017-12-25 NOTE — Telephone Encounter (Signed)
[name] from [facility] called to ask if this patient will be followed here since [name] is now gone. After review of the chart advised the patient has an office visit with [name] tomorrow. She wanted him to know that the patient has been getting Rocephin 2 grams daily since 12/20/17 and has a stop date of 12/28/17. Advised will let the doctor know and will give them a call after the visit with any changes.

## 2017-12-25 NOTE — Telephone Encounter (Signed)
Thanks Travis 

## 2017-12-26 ENCOUNTER — Ambulatory Visit (INDEPENDENT_AMBULATORY_CARE_PROVIDER_SITE_OTHER): Payer: BLUE CROSS/BLUE SHIELD | Admitting: Infectious Disease

## 2017-12-26 ENCOUNTER — Encounter: Payer: Self-pay | Admitting: Infectious Disease

## 2017-12-26 VITALS — BP 168/91 | HR 90 | Temp 97.7°F | Ht 70.0 in | Wt 239.0 lb

## 2017-12-26 DIAGNOSIS — A498 Other bacterial infections of unspecified site: Secondary | ICD-10-CM

## 2017-12-26 DIAGNOSIS — L089 Local infection of the skin and subcutaneous tissue, unspecified: Secondary | ICD-10-CM

## 2017-12-26 DIAGNOSIS — M86471 Chronic osteomyelitis with draining sinus, right ankle and foot: Secondary | ICD-10-CM

## 2017-12-26 DIAGNOSIS — E11628 Type 2 diabetes mellitus with other skin complications: Secondary | ICD-10-CM

## 2017-12-26 DIAGNOSIS — A401 Sepsis due to streptococcus, group B: Secondary | ICD-10-CM | POA: Diagnosis not present

## 2017-12-26 HISTORY — DX: Sepsis due to Streptococcus, group B: A40.1

## 2017-12-26 HISTORY — DX: Other bacterial infections of unspecified site: A49.8

## 2017-12-26 MED ORDER — LEVOFLOXACIN 500 MG PO TABS: 500.0000 mg | ORAL_TABLET | Freq: Every day | ORAL | 11 refills | Status: DC

## 2017-12-26 NOTE — Progress Notes (Signed)
Reason for consult: Diabetic foot infection with group B strep and Morganella  Requesting physician: Dr. [name]  Subjective:    Patient ID: [name], male    DOB: 10/18/1960, 57 y.o.   MRN: [id number]  HPI  57 y.o. male admitted with bil DM foot infections from wound clinic. On admit his wbc was 10 and no fevers. He had MRI done on L with 5th MT abscess and osteo and on R with osteo of distal 5th MT. Culture with GBS and Morganella. He had bil angiogram with stenting. He had on 6/2 with R 5th toe and partial ray amputation and L 5th MT debridement.  ESR 74, A1c 8.8.   I reviewed his imaging including an MRI of his left and right foot and the findings are a bit confusing.  I have some concern that imaging from right foot was used for both studies as I cannot see clearly a left foot on MRI but I will take this up with radiology.  He says he is improved dramatically since being on ceftriaxone which he was placed on by Dr. Ola Spurr at [facility].  He has considerably less pain and swelling in both feet since then.  He has been following up with podiatry.  He has significant ulceration still where he has had amputation of his right metatarsal infection and also a fistula leading ulcer on the lateral aspect of his left heel.  Past Medical History:  Diagnosis Date  . Arthritis   . Bacterial infection due to Morganella morganii 12/26/2017  . BPH (benign prostatic hypertrophy)   . Diabetes mellitus   . Diabetic neuropathy ([place])   . Hypertension   . Metatarsal bone fracture    left 5th toe  . Osteomyelitis of toe of right foot ([place])   . Post-operative infection    and diabetic ulcer left foot  . Sepsis due to Streptococcus, group B ([place]) 12/26/2017  . Sleep apnea     does not wear CPAP  . Wears glasses     Past Surgical History:  Procedure Laterality Date  . COLONOSCOPY W/ BIOPSIES AND POLYPECTOMY    . I&D EXTREMITY Left 01/11/2016   Procedure: LEFT FOOT  IRRIGATION AND DEBRIDEMENT WOUND VAC AND REMOVAL OF HARDWARE;  Surgeon: Wylene Simmer, MD;  Location: [place];  Service: Orthopedics;  Laterality: Left;  . LOWER EXTREMITY ANGIOGRAPHY N/A 12/01/2017   Procedure: Lower Extremity Angiography;  Surgeon: Algernon Huxley, MD;  Location: [place] [facility];  Service: Cardiovascular;  Laterality: N/A;  . LOWER EXTREMITY ANGIOGRAPHY Right 12/04/2017   Procedure: Lower Extremity Angiography;  Surgeon: Algernon Huxley, MD;  Location: [place] [facility];  Service: Cardiovascular;  Laterality: Right;  . ORIF TOE FRACTURE Left 06/08/2015   Procedure: OPEN REDUCTION INTERNAL FIXATION (ORIF) LEFT FIFTH METATARSAL BASE FRACTURE NONUNION; CALCANEAL AUTOGRAFT ;  Surgeon: Wylene Simmer, MD;  Location: [place];  Service: Orthopedics;  Laterality: Left;  . PATELLA RECONSTRUCTION Left 2005  . PROSTATE ABLATION  2014  . TOE AMPUTATION     partial amputation right great toe  . WOUND DEBRIDEMENT Bilateral 11/30/2017   Procedure: DEBRIDEMENT WOUND;  Surgeon: Sharlotte Alamo, DPM;  Location: [facility];  Service: Podiatry;  Laterality: Bilateral;    Family History  Problem Relation Age of Onset  . Diabetes Mother   . Diabetes Other       Social History   Socioeconomic History  . Marital status: Married    Spouse  name: Not on file  . Number of children: Not on file  . Years of education: Not on file  . Highest education level: Not on file  Occupational History  . Not on file  Social Needs  . Financial resource strain: Not on file  . Food insecurity:    Worry: Not on file    Inability: Not on file  . Transportation needs:    Medical: Not on file    Non-medical: Not on file  Tobacco Use  . Smoking status: Never Smoker  . Smokeless tobacco: Never Used  Substance and Sexual Activity  . Alcohol use: Yes    Comment: occasional   . Drug use: No  . Sexual activity: Not on file  Lifestyle  . Physical activity:    Days per week: Not on file    Minutes per  session: Not on file  . Stress: Not on file  Relationships  . Social connections:    Talks on phone: Not on file    Gets together: Not on file    Attends religious service: Not on file    Active member of club or organization: Not on file    Attends meetings of clubs or organizations: Not on file    Relationship status: Not on file  Other Topics Concern  . Not on file  Social History Narrative   Lives with wife. Children all grown up.    Allergies  Allergen Reactions  . Bactrim [Sulfamethoxazole-Trimethoprim] Hives     Current Outpatient Medications:  .  aspirin EC 81 MG tablet, Take 81 mg by mouth daily., Disp: , Rfl:  .  cefTRIAXone (ROCEPHIN) IVPB, Inject 2 g into the vein daily for 25 days. Indication:  Osteomyelitis  Last Day of Therapy:  12/28/17 Labs - Once weekly:  CBC/D and BMP, Labs - Every other week:  ESR and CRP Please fax labs to [phone]--Do NOT pull PICC, Disp: 25 Units, Rfl: 0 .  docusate sodium (COLACE) 100 MG capsule, Take 1 capsule (100 mg total) by mouth 2 (two) times daily. While taking narcotic pain medicine., Disp: 30 capsule, Rfl: 0 .  FARXIGA 10 MG TABS tablet, Take 1 tablet by mouth daily., Disp: , Rfl:  .  insulin aspart (NOVOLOG) 100 UNIT/ML injection, Inject 0-15 Units into the skin 4 (four) times daily -  before meals and at bedtime. CBG 70 - 120: 0 units CBG 121 - 150: 2 units CBG 151 - 200: 3 units CBG 201 - 250: 5 units CBG 251 - 300: 8 units CBG 301 - 350: 11 units CBG 351 - 400: 15 units CBG > 400: call MD, Disp: 10 mL, Rfl: 11 .  Insulin Glargine (BASAGLAR KWIKPEN) 100 UNIT/ML SOPN, Inject 0.35 mLs (35 Units total) into the skin at bedtime., Disp: 30 pen, Rfl: 3 .  losartan (COZAAR) 50 MG tablet, Take 50 mg by mouth daily., Disp: , Rfl:  .  metFORMIN (GLUCOPHAGE) 1000 MG tablet, Take 1,000 mg by mouth 2 (two) times daily., Disp: , Rfl:  .  oxyCODONE-acetaminophen (PERCOCET/ROXICET) 5-325 MG tablet, Take 1-2 tablets by mouth every 4 (four) hours  as needed for severe pain., Disp: 30 tablet, Rfl: 0 .  simvastatin (ZOCOR) 20 MG tablet, TAKE 1 TABLET BY MOUTH IN THE EVENING, Disp: 90 tablet, Rfl: 0 .  clopidogrel (PLAVIX) 75 MG tablet, Take 1 tablet (75 mg total) by mouth daily. (Patient not taking: Reported on 12/26/2017), Disp: , Rfl:  .  levofloxacin (LEVAQUIN) 500 MG  tablet, Take 1 tablet (500 mg total) by mouth daily., Disp: 30 tablet, Rfl: 11 No current facility-administered medications for this visit.   Facility-Administered Medications Ordered in Other Visits:  .  Tdap (BOOSTRIX) injection 0.5 mL, 0.5 mL, Intramuscular, Once, Betancourt, Tina A, NP      Review of Systems  Constitutional: Negative for activity change, appetite change, chills, diaphoresis, fatigue, fever and unexpected weight change.  HENT: Negative for congestion, rhinorrhea, sinus pressure, sneezing, sore throat and trouble swallowing.   Eyes: Negative for photophobia and visual disturbance.  Respiratory: Negative for cough, chest tightness, shortness of breath, wheezing and stridor.   Cardiovascular: Negative for chest pain, palpitations and leg swelling.  Gastrointestinal: Negative for abdominal distention, abdominal pain, anal bleeding, blood in stool, constipation, diarrhea, nausea and vomiting.  Genitourinary: Negative for difficulty urinating, dysuria, flank pain and hematuria.  Musculoskeletal: Negative for arthralgias, back pain, gait problem, joint swelling and myalgias.  Skin: Positive for wound. Negative for color change, pallor and rash.  Neurological: Negative for dizziness, tremors, weakness and light-headedness.  Hematological: Negative for adenopathy. Does not bruise/bleed easily.  Psychiatric/Behavioral: Negative for agitation, behavioral problems, confusion, decreased concentration, dysphoric mood and sleep disturbance.       Objective:   Physical Exam  Constitutional: He is oriented to person, place, and time. He appears well-developed  and well-nourished.  HENT:  Head: Normocephalic and atraumatic.  Eyes: Conjunctivae and EOM are normal.  Neck: Normal range of motion. Neck supple.  Cardiovascular: Normal rate and regular rhythm.  Pulmonary/Chest: Effort normal. No respiratory distress. He has no wheezes.  Abdominal: Soft. He exhibits no distension.  Musculoskeletal: Normal range of motion. He exhibits no edema or tenderness.  Neurological: He is alert and oriented to person, place, and time.  Skin: Skin is warm and dry. No rash noted. No erythema. No pallor.   Wounds 12/26/2017:  Right foot       Left foot           Assessment & Plan:   Diabetic foot ulcers with osteomyelitis status post amputations.  I have some concern of the left foot as he has tunneling of the ulceration on the lateral aspect of his foot which seems to be going deeper into his foot near his heel.  Furthermore it is not clear to me that this area was captured on imaging.  I am going to extend his antibiotics with levofloxacin orally for 1 month after he finishes ceftriaxone which will be happening in the next 2 days.  He will be following up with surgery and I will be checking in with radiology with regards the MRIs had has had done so far.  I have emphasized the need for control of his diabetes mellitus and other comorbid medical conditions have led to these diabetic foot infections in addition to antibiotics and proper surgical control.  I spent greater than 60 minutes with the patient including greater than 50% of time in face to face counsel of the patient and his spouse regarding the nature of diabetic foot infections how to manage this specific infection with specific pathogens he has in the sites that are involved and in coordination of his care.

## 2017-12-31 ENCOUNTER — Telehealth: Payer: Self-pay | Admitting: *Deleted

## 2017-12-31 ENCOUNTER — Inpatient Hospital Stay: Payer: BLUE CROSS/BLUE SHIELD | Admitting: Internal Medicine

## 2017-12-31 NOTE — Telephone Encounter (Signed)
Patient called, stating his antibiotics ended 6/30 and he needs an order given to [facility] pharmacy to have the PICC pulled.  Please advise. Landis Gandy, RN

## 2018-01-02 NOTE — Telephone Encounter (Signed)
Relayed verbal order to [facility] at [place].

## 2018-01-02 NOTE — Telephone Encounter (Signed)
PICC line can be pulled. I thought [name] would have already indicated this in his note too?

## 2018-01-02 NOTE — Telephone Encounter (Signed)
Perfect

## 2018-01-08 ENCOUNTER — Other Ambulatory Visit: Payer: Self-pay

## 2018-01-08 ENCOUNTER — Emergency Department: Payer: BLUE CROSS/BLUE SHIELD

## 2018-01-08 ENCOUNTER — Encounter: Payer: Self-pay | Admitting: Emergency Medicine

## 2018-01-08 ENCOUNTER — Emergency Department
Admission: EM | Admit: 2018-01-08 | Discharge: 2018-01-08 | Disposition: A | Discharge status: Home / Self Care | Payer: BLUE CROSS/BLUE SHIELD | Attending: Emergency Medicine | Admitting: Emergency Medicine

## 2018-01-08 DIAGNOSIS — E1142 Type 2 diabetes mellitus with diabetic polyneuropathy: Secondary | ICD-10-CM | POA: Insufficient documentation

## 2018-01-08 DIAGNOSIS — Z794 Long term (current) use of insulin: Secondary | ICD-10-CM | POA: Insufficient documentation

## 2018-01-08 DIAGNOSIS — R531 Weakness: Secondary | ICD-10-CM | POA: Diagnosis present

## 2018-01-08 DIAGNOSIS — E11621 Type 2 diabetes mellitus with foot ulcer: Secondary | ICD-10-CM | POA: Insufficient documentation

## 2018-01-08 DIAGNOSIS — Z7982 Long term (current) use of aspirin: Secondary | ICD-10-CM | POA: Insufficient documentation

## 2018-01-08 DIAGNOSIS — Z79899 Other long term (current) drug therapy: Secondary | ICD-10-CM | POA: Insufficient documentation

## 2018-01-08 DIAGNOSIS — I10 Essential (primary) hypertension: Secondary | ICD-10-CM | POA: Diagnosis not present

## 2018-01-08 DIAGNOSIS — G63 Polyneuropathy in diseases classified elsewhere: Secondary | ICD-10-CM

## 2018-01-08 DIAGNOSIS — E1140 Type 2 diabetes mellitus with diabetic neuropathy, unspecified: Secondary | ICD-10-CM | POA: Insufficient documentation

## 2018-01-08 DIAGNOSIS — L97519 Non-pressure chronic ulcer of other part of right foot with unspecified severity: Secondary | ICD-10-CM | POA: Insufficient documentation

## 2018-01-08 DIAGNOSIS — R29898 Other symptoms and signs involving the musculoskeletal system: Secondary | ICD-10-CM

## 2018-01-08 LAB — CBC WITH DIFFERENTIAL/PLATELET
BASOS PCT: 0 %
Basophils Absolute: 0 10*3/uL (ref 0–0.1)
EOS ABS: 0 10*3/uL (ref 0–0.7)
Eosinophils Relative: 0 %
HCT: 41.5 % (ref 40.0–52.0)
HEMOGLOBIN: 14.3 g/dL (ref 13.0–18.0)
Lymphocytes Relative: 21 %
Lymphs Abs: 2.3 10*3/uL (ref 1.0–3.6)
MCH: 31.9 pg (ref 26.0–34.0)
MCHC: 34.4 g/dL (ref 32.0–36.0)
MCV: 92.8 fL (ref 80.0–100.0)
MONOS PCT: 11 %
Monocytes Absolute: 1.3 10*3/uL — ABNORMAL HIGH (ref 0.2–1.0)
NEUTROS PCT: 68 %
Neutro Abs: 7.8 10*3/uL — ABNORMAL HIGH (ref 1.4–6.5)
Platelets: 273 10*3/uL (ref 150–440)
RBC: 4.47 MIL/uL (ref 4.40–5.90)
RDW: 13.7 % (ref 11.5–14.5)
WBC: 11.4 10*3/uL — ABNORMAL HIGH (ref 3.8–10.6)

## 2018-01-08 LAB — COMPREHENSIVE METABOLIC PANEL
ALK PHOS: 99 U/L (ref 38–126)
ALT: 22 U/L (ref 0–44)
ANION GAP: 11 (ref 5–15)
AST: 45 U/L — ABNORMAL HIGH (ref 15–41)
Albumin: 3.8 g/dL (ref 3.5–5.0)
BUN: 21 mg/dL — ABNORMAL HIGH (ref 6–20)
CALCIUM: 9 mg/dL (ref 8.9–10.3)
CO2: 26 mmol/L (ref 22–32)
Chloride: 102 mmol/L (ref 98–111)
Creatinine, Ser: 1.31 mg/dL — ABNORMAL HIGH (ref 0.61–1.24)
GFR calc non Af Amer: 59 mL/min — ABNORMAL LOW (ref >60)
GLUCOSE: 169 mg/dL — AB (ref 70–99)
Potassium: 3.4 mmol/L — ABNORMAL LOW (ref 3.5–5.1)
SODIUM: 139 mmol/L (ref 135–145)
TOTAL PROTEIN: 8.1 g/dL (ref 6.5–8.1)
Total Bilirubin: 0.8 mg/dL (ref 0.3–1.2)

## 2018-01-08 LAB — LACTIC ACID, PLASMA: LACTIC ACID, VENOUS: 1.3 mmol/L (ref 0.5–1.9)

## 2018-01-08 LAB — PROTIME-INR
INR: 1.05
PROTHROMBIN TIME: 13.6 s (ref 11.4–15.2)

## 2018-01-08 NOTE — ED Triage Notes (Signed)
Pt via pov from home with right leg weakness. Pt's 2nd toe of right foot was amputated 6 weeks ago and he has had infection in both feet. He just had picc line removed; is taking oral antibiotics at this time. Pt alert & oriented; nad noted.

## 2018-01-08 NOTE — ED Provider Notes (Signed)
[facility] Emergency Department Provider Note ____________________________________________   First MD Initiated Contact with Patient 01/08/18 0945     (approximate)  I have reviewed the triage vital signs and the nursing notes.   HISTORY  Chief Complaint Extremity Weakness    HPI [name] is a 57 y.o. male with PMH as noted below including diabetes, peripheral neuropathy, and chronic ulceration as well as likely peripheral vascular disease with recent and ongoing treatment for bilateral foot osteomyelitis who presents with right foot weakness, acute onset last night, persisting today, and not associated with any significant increased numbness or pain.  The patient denies any associated worsening swelling.  He states that the foot feels like it is dragging and limp and it is more difficult for him to walk.  He denies any specific injury.  He states that he has been compliant with his outpatient antibiotics.  He denies any weakness or numbness elsewhere.  Past Medical History:  Diagnosis Date  . Arthritis   . Bacterial infection due to Morganella morganii 12/26/2017  . BPH (benign prostatic hypertrophy)   . Diabetes mellitus   . Diabetic neuropathy ([place])   . Hypertension   . Metatarsal bone fracture    left 5th toe  . Osteomyelitis of toe of right foot ([place])   . Post-operative infection    and diabetic ulcer left foot  . Sepsis due to Streptococcus, group B ([place]) 12/26/2017  . Sleep apnea     does not wear CPAP  . Wears glasses     Patient Active Problem List   Diagnosis Date Noted  . Bacterial infection due to Morganella morganii 12/26/2017  . Sepsis due to Streptococcus, group B ([place]) 12/26/2017  . HTN (hypertension) 11/29/2017  . HLD (hyperlipidemia) 11/29/2017  . Osteomyelitis ([place]) 11/29/2017  . Cellulitis of left foot 03/05/2017  . Surgery, elective   . Hardware complicating wound infection ([place])   . Diabetes mellitus due to  underlying condition, uncontrolled, with diabetic neuropathy ([place])   . Metatarsal stress fracture of left foot 01/12/2016  . Diabetes mellitus with diabetic polyneuropathy ([place]) 01/12/2016  . Diabetic foot infection ([place]) 01/12/2016  . Diabetic ulcer of foot associated with type 2 diabetes mellitus, with necrosis of muscle ([place]) 01/11/2016    Past Surgical History:  Procedure Laterality Date  . COLONOSCOPY W/ BIOPSIES AND POLYPECTOMY    . I&D EXTREMITY Left 01/11/2016   Procedure: LEFT FOOT IRRIGATION AND DEBRIDEMENT WOUND VAC AND REMOVAL OF HARDWARE;  Surgeon: Wylene Simmer, MD;  Location: [place];  Service: Orthopedics;  Laterality: Left;  . LOWER EXTREMITY ANGIOGRAPHY N/A 12/01/2017   Procedure: Lower Extremity Angiography;  Surgeon: Algernon Huxley, MD;  Location: [place] [facility];  Service: Cardiovascular;  Laterality: N/A;  . LOWER EXTREMITY ANGIOGRAPHY Right 12/04/2017   Procedure: Lower Extremity Angiography;  Surgeon: Algernon Huxley, MD;  Location: [place] [facility];  Service: Cardiovascular;  Laterality: Right;  . ORIF TOE FRACTURE Left 06/08/2015   Procedure: OPEN REDUCTION INTERNAL FIXATION (ORIF) LEFT FIFTH METATARSAL BASE FRACTURE NONUNION; CALCANEAL AUTOGRAFT ;  Surgeon: Wylene Simmer, MD;  Location: [place];  Service: Orthopedics;  Laterality: Left;  . PATELLA RECONSTRUCTION Left 2005  . PROSTATE ABLATION  2014  . TOE AMPUTATION     partial amputation right great toe  . WOUND DEBRIDEMENT Bilateral 11/30/2017   Procedure: DEBRIDEMENT WOUND;  Surgeon: Sharlotte Alamo, DPM;  Location: [facility];  Service: Podiatry;  Laterality: Bilateral;  Prior to Admission medications   Medication Sig Start Date End Date Taking? Authorizing Provider  aspirin EC 81 MG tablet Take 81 mg by mouth daily.   Yes [provider]  FARXIGA 10 MG TABS tablet Take 1 tablet by mouth daily. 03/03/17  Yes [provider]  HUMALOG KWIKPEN 100 UNIT/ML KiwkPen Inject 0-15 Units into the  skin 3 (three) times daily. MAX-45 units a day 01/06/18  Yes [provider]  insulin aspart (NOVOLOG) 100 UNIT/ML injection Inject 0-15 Units into the skin 4 (four) times daily -  before meals and at bedtime. CBG 70 - 120: 0 units CBG 121 - 150: 2 units CBG 151 - 200: 3 units CBG 201 - 250: 5 units CBG 251 - 300: 8 units CBG 301 - 350: 11 units CBG 351 - 400: 15 units CBG > 400: call MD 12/05/17  Yes Henreitta Leber, MD  levofloxacin (LEVAQUIN) 500 MG tablet Take 1 tablet (500 mg total) by mouth daily. 12/26/17  Yes Tommy Medal, Lavell Islam, MD  losartan (COZAAR) 50 MG tablet Take 50 mg by mouth daily.   Yes [provider]  metFORMIN (GLUCOPHAGE) 1000 MG tablet Take 1,000 mg by mouth 2 (two) times daily. 10/21/15  Yes [provider]  simvastatin (ZOCOR) 20 MG tablet TAKE 1 TABLET BY MOUTH IN THE EVENING 05/02/16  Yes Renato Shin, MD  TRADJENTA 5 MG TABS tablet Take 1 tablet by mouth daily. 01/02/18  Yes [provider]  clopidogrel (PLAVIX) 75 MG tablet Take 1 tablet (75 mg total) by mouth daily. Patient not taking: Reported on 12/26/2017 12/06/17   Henreitta Leber, MD  docusate sodium (COLACE) 100 MG capsule Take 1 capsule (100 mg total) by mouth 2 (two) times daily. While taking narcotic pain medicine. Patient not taking: Reported on 01/08/2018 01/12/16   Corky Sing, PA-C  Insulin Glargine [facility]) 100 UNIT/ML SOPN Inject 0.35 mLs (35 Units total) into the skin at bedtime. Patient not taking: Reported on 01/08/2018 05/21/17   Renato Shin, MD  oxyCODONE-acetaminophen (PERCOCET/ROXICET) 5-325 MG tablet Take 1-2 tablets by mouth every 4 (four) hours as needed for severe pain. 12/05/17   Henreitta Leber, MD    Allergies Bactrim [sulfamethoxazole-trimethoprim]  Family History  Problem Relation Age of Onset  . Diabetes Mother   . Diabetes Other     Social History Social History   Tobacco Use  . Smoking status: Never Smoker  . Smokeless  tobacco: Never Used  Substance Use Topics  . Alcohol use: Yes    Comment: occasional   . 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 Eyes: No redness. ENT: No sore throat. Cardiovascular: Denies chest pain. Respiratory: Denies shortness of breath. Gastrointestinal: No nausea, no vomiting. Genitourinary: Negative for dysuria.  Musculoskeletal: Positive for chronic foot pain. Skin: Negative for rash or swelling. Neurological: Positive for right foot weakness.    ____________________________________________   PHYSICAL EXAM:  VITAL SIGNS: ED Triage Vitals  Enc Vitals Group     BP 01/08/18 0913 (!) 166/89     Pulse Rate 01/08/18 0913 (!) 113     Resp 01/08/18 0913 18     Temp 01/08/18 0913 98.2 F (36.8 C)     Temp Source 01/08/18 0913 Oral     SpO2 01/08/18 0913 98 %     Weight 01/08/18  0914 239 lb (108.4 kg)     Height 01/08/18 0914 5\' 10"  (1.778 m)     Head Circumference --      Peak Flow --      Pain Score 01/08/18 0914 0     Pain Loc --      Pain Edu? --      Excl. in [place]? --     Constitutional: Alert and oriented. Well appearing and in no acute distress. Eyes: Conjunctivae are normal.  Head: Atraumatic. Nose: No congestion/rhinnorhea. Mouth/Throat: Mucous membranes are moist.   Neck: Normal range of motion.  Cardiovascular: Normal rate, regular rhythm.  Respiratory: Normal respiratory effort. Gastrointestinal: No distention.  Musculoskeletal: Right foot with no deformity or focal tenderness.  Ulcer is healing well.  No erythema, induration, or abnormal temperature. Neurologic:  Normal speech and language. No gross focal neurologic deficits are appreciated.  5/5 motor strength to bilateral lower extremities.  Chronic decreased sensation of bilateral feet. Skin:  Skin is warm and dry. No rash noted. Psychiatric: Mood and affect are normal. Speech and  behavior are normal.  ____________________________________________   LABS (all labs ordered are listed, but only abnormal results are displayed)  Labs Reviewed  COMPREHENSIVE METABOLIC PANEL - Abnormal; Notable for the following components:      Result Value   Potassium 3.4 (*)    Glucose, Bld 169 (*)    BUN 21 (*)    Creatinine, Ser 1.31 (*)    AST 45 (*)    GFR calc non Af Amer 59 (*)    All other components within normal limits  CBC WITH DIFFERENTIAL/PLATELET - Abnormal; Notable for the following components:   WBC 11.4 (*)    Neutro Abs 7.8 (*)    Monocytes Absolute 1.3 (*)    All other components within normal limits  CULTURE, BLOOD (ROUTINE X 2)  CULTURE, BLOOD (ROUTINE X 2)  LACTIC ACID, PLASMA  PROTIME-INR  LACTIC ACID, PLASMA  URINALYSIS, COMPLETE (UACMP) WITH MICROSCOPIC   ____________________________________________  EKG   ____________________________________________  RADIOLOGY    ____________________________________________   PROCEDURES  Procedure(s) performed: No  Procedures  Critical Care performed: No ____________________________________________   INITIAL IMPRESSION / ASSESSMENT AND PLAN / ED COURSE  Pertinent labs & imaging results that were available during my care of the patient were reviewed by me and considered in my medical decision making (see chart for details).  57 year old male with PMH as noted above presents with acute onset of right foot weakness since last night, stating it feels like it is dragging.  He reports chronic numbness and states that it is not significantly worse.  He denies any other acute neurologic symptoms.  I reviewed the past medical records in epic; the patient was admitted in early June with bilateral osteomyelitis diagnosed on MRI.  He had a vascular surgery consult, had amputation of the right second toe, and angiogram with revascularization bilaterally.  He was discharged with a PICC line and was on IV  antibiotics until about 10 days ago when he was switched to oral antibiotic's which she is still taking.  On exam, the vital signs are normal except for slight tachycardia.  The patient is overall well-appearing.  The exam of the right foot is relatively unremarkable given the patient's history.  He chronically does not have palpable DP pulses, but the color and warmth of the foot are normal, there is no erythema, induration, or other signs of acute cellulitis, and no drainage or pus.  He  has normal motor strength on plantar and dorsiflexion and states his sensation is unchanged.  It is a little bit difficult to pinpoint exactly the nature of the patient's specific acute symptom.  I do not suspect acute embolic event given the exam findings and the fact that the patien patient's vascular disease appears to be primarily diabetes related.  There is no evidence of acute cellulitis or recurrent osteomyelitis.  I suspect that this may just be a progression of the patient's chronic neuropathy.  We will obtain labs and a vascular surgery consult for further recommendations.  Clinical Course as of Jan 09 1323  Thu Jan 08, 2018  1029 Hemoglobin: 14.3 [SS]    Clinical Course User Index [SS] Arta Silence, MD   ----------------------------------------- 1:21 PM on 01/08/2018 -----------------------------------------  The patient's lab work-up is unchanged from his baseline.  His WBC count is borderline elevated, however his tachycardia has resolved, he has no fever or elevated lactate, and no clinical findings to suggest recurrent infection.  I consulted Dr. Delana Meyer from vascular surgery.  He advised that weakness would not be a typical finding related to a vascular etiology but he advised me to obtain ABIs to further evaluate.  The ABIs are essentially normal and patient has biphasic waveform in the affected leg.  I discussed again with Dr. Delana Meyer, who confirms that there is no indication for acute  vascular etiology or other emergent work-up and he would recommend that the patient follow-up as an outpatient with Dr. Lucky Cowboy who performed his angioplasty.  I had the patient ambulate and there is no evidence of foot drop.  He describes "dragging" but in reality he is able to ambulate normal except for possible mild hesitancy on that side.  Overall based on the clinical presentation and the results of the work-up, the presentation is consistent with exacerbation or progression of patient's chronic neuropathy.  There is no evidence of acute vascular etiology or recurrence of his osteomyelitis.  I counseled the patient on the results of the work-up.  He feels well and would like to go home.  He is requesting a pain management referral as he was unable to get one from his primary care doctor.  I will also give him referral to Dr. Lucky Cowboy as recommended by Dr. Delana Meyer.  The patient agrees with the plan of care.  Return precautions given, and he expresses understanding.  ____________________________________________   FINAL CLINICAL IMPRESSION(S) / ED DIAGNOSES  Final diagnoses:  Lower extremity weakness  Polyneuropathy associated with underlying disease ([place])      NEW MEDICATIONS STARTED DURING THIS VISIT:  New Prescriptions   No medications on file     Note:  This document was prepared using Dragon voice recognition software and may include unintentional dictation errors.    Arta Silence, MD 01/08/18 1324

## 2018-01-08 NOTE — Discharge Instructions (Addendum)
Make an appointment to follow-up with Dr. Lucky Cowboy within the next 1 to 2 weeks.  We have also provided a referral to Dr. Zollie Scale, 1 of our pain management specialists.  Continue to take the antibiotic and other medications you are normally taking as prescribed.  Return to the ER for new, worsening, persistent severe pain, weakness, numbness, worsening dragging of the foot, inability to stand or bear weight on the foot or leg, worsening swelling, redness, pain going up the leg, fevers, or any other new or worsening symptoms that concern you.

## 2018-01-13 LAB — CULTURE, BLOOD (ROUTINE X 2)
CULTURE: NO GROWTH
Culture: NO GROWTH
Special Requests: ADEQUATE

## 2018-01-19 ENCOUNTER — Other Ambulatory Visit: Payer: Self-pay | Admitting: *Deleted

## 2018-01-19 DIAGNOSIS — A498 Other bacterial infections of unspecified site: Secondary | ICD-10-CM

## 2018-01-19 MED ORDER — LEVOFLOXACIN 500 MG PO TABS: 500.0000 mg | ORAL_TABLET | Freq: Every day | ORAL | 0 refills | Status: DC

## 2018-01-21 ENCOUNTER — Other Ambulatory Visit: Payer: Self-pay | Admitting: Infectious Diseases

## 2018-01-21 DIAGNOSIS — A498 Other bacterial infections of unspecified site: Secondary | ICD-10-CM

## 2018-01-21 MED ORDER — LEVOFLOXACIN 500 MG PO TABS: 500.0000 mg | ORAL_TABLET | Freq: Every day | ORAL | 0 refills | Status: DC

## 2018-01-23 ENCOUNTER — Ambulatory Visit (INDEPENDENT_AMBULATORY_CARE_PROVIDER_SITE_OTHER): Payer: BLUE CROSS/BLUE SHIELD | Admitting: Family

## 2018-01-23 ENCOUNTER — Encounter: Payer: Self-pay | Admitting: Family

## 2018-01-23 VITALS — BP 166/89 | HR 86 | Temp 97.9°F | Ht 70.0 in | Wt 239.0 lb

## 2018-01-23 DIAGNOSIS — M86672 Other chronic osteomyelitis, left ankle and foot: Secondary | ICD-10-CM

## 2018-01-23 DIAGNOSIS — M86471 Chronic osteomyelitis with draining sinus, right ankle and foot: Secondary | ICD-10-CM | POA: Diagnosis not present

## 2018-01-23 NOTE — Progress Notes (Signed)
Subjective:    Patient ID: [name], male    DOB: [birth year], 57 y.o.   MRN: [id number]  Chief Complaint  Patient presents with  . Osteomyelitis     HPI:  [name] is a 57 y.o. male who presents today for routine follow up of osteomyelitis and diabetic foot ulcer.  [name] was last seen in he of office on 12/26/17 by Dr. Tommy Medal for routine follow up. He was completing his ceftriaxone at that time for Group B Strep and Morganella. He was placed on oral levofloxacin for an additional month at the completion of his ceftriaxone secondary to concern for tunneling of the left foot ulceration. Since he was seen he has been evaluated in he ED with concern of acute right foot weakness feeling like his foot was "dragging". Work up was unremarkable. He was also seen by Dr. Cleda Mccreedy with continued full thickness wounds on the lateral aspect the left midfoot and the right foot dorsal and plantar at the fifth ray resection Good healthy granular bleeding was noted with no purulence or significant drainage. X-rays appear with no evidence of residual osteomyelitis in either the left or right foot.   [name] reports taking levofloxacin as prescribed with no adverse side effects or myalgias. He has had no fevers, chills or sweats. Drainage has decreased significantly and reports adherence with continued wound care. He has no pain but does continue to have diabetic neuropathy.  Allergies  Allergen Reactions  . Bactrim [Sulfamethoxazole-Trimethoprim] Hives      Outpatient Medications Prior to Visit  Medication Sig Dispense Refill  . FARXIGA 10 MG TABS tablet Take 1 tablet by mouth daily.    Marland Kitchen HUMALOG KWIKPEN 100 UNIT/ML KiwkPen Inject 0-15 Units into the skin 3 (three) times daily. MAX-45 units a day    . Insulin Glargine (BASAGLAR KWIKPEN) 100 UNIT/ML SOPN Inject 0.35 mLs (35 Units total) into the skin at bedtime. 30 pen 3  . levofloxacin (LEVAQUIN) 500 MG tablet Take 1 tablet (500 mg total)  by mouth daily. 30 tablet 0  . losartan (COZAAR) 50 MG tablet Take 50 mg by mouth daily.    . metFORMIN (GLUCOPHAGE) 1000 MG tablet Take 1,000 mg by mouth 2 (two) times daily.    . simvastatin (ZOCOR) 20 MG tablet TAKE 1 TABLET BY MOUTH IN THE EVENING 90 tablet 0  . TRADJENTA 5 MG TABS tablet Take 1 tablet by mouth daily.    Marland Kitchen aspirin EC 81 MG tablet Take 81 mg by mouth daily.    . clopidogrel (PLAVIX) 75 MG tablet Take 1 tablet (75 mg total) by mouth daily. (Patient not taking: Reported on 12/26/2017)    . docusate sodium (COLACE) 100 MG capsule Take 1 capsule (100 mg total) by mouth 2 (two) times daily. While taking narcotic pain medicine. (Patient not taking: Reported on 01/08/2018) 30 capsule 0  . insulin aspart (NOVOLOG) 100 UNIT/ML injection Inject 0-15 Units into the skin 4 (four) times daily -  before meals and at bedtime. CBG 70 - 120: 0 units CBG 121 - 150: 2 units CBG 151 - 200: 3 units CBG 201 - 250: 5 units CBG 251 - 300: 8 units CBG 301 - 350: 11 units CBG 351 - 400: 15 units CBG > 400: call MD (Patient not taking: Reported on 01/23/2018) 10 mL 11  . oxyCODONE-acetaminophen (PERCOCET/ROXICET) 5-325 MG tablet Take 1-2 tablets by mouth every 4 (four) hours as needed for severe pain. 30 tablet  0   Facility-Administered Medications Prior to Visit  Medication Dose Route Frequency Provider Last Rate Last Dose  . Tdap (BOOSTRIX) injection 0.5 mL  0.5 mL Intramuscular Once Betancourt, Aura Fey, NP         Past Medical History:  Diagnosis Date  . Arthritis   . Bacterial infection due to Morganella morganii 12/26/2017  . BPH (benign prostatic hypertrophy)   . Diabetes mellitus   . Diabetic neuropathy ([place])   . Hypertension   . Metatarsal bone fracture    left 5th toe  . Osteomyelitis of toe of right foot ([place])   . Post-operative infection    and diabetic ulcer left foot  . Sepsis due to Streptococcus, group B ([place]) 12/26/2017  . Sleep apnea     does not wear CPAP  . Wears glasses       Past Surgical History:  Procedure Laterality Date  . COLONOSCOPY W/ BIOPSIES AND POLYPECTOMY    . I&D EXTREMITY Left 01/11/2016   Procedure: LEFT FOOT IRRIGATION AND DEBRIDEMENT WOUND VAC AND REMOVAL OF HARDWARE;  Surgeon: Wylene Simmer, MD;  Location: [facility];  Service: Orthopedics;  Laterality: Left;  . LOWER EXTREMITY ANGIOGRAPHY N/A 12/01/2017   Procedure: Lower Extremity Angiography;  Surgeon: Algernon Huxley, MD;  Location: [facility];  Service: Cardiovascular;  Laterality: N/A;  . LOWER EXTREMITY ANGIOGRAPHY Right 12/04/2017   Procedure: Lower Extremity Angiography;  Surgeon: Algernon Huxley, MD;  Location: [facility];  Service: Cardiovascular;  Laterality: Right;  . ORIF TOE FRACTURE Left 06/08/2015   Procedure: OPEN REDUCTION INTERNAL FIXATION (ORIF) LEFT FIFTH METATARSAL BASE FRACTURE NONUNION; CALCANEAL AUTOGRAFT ;  Surgeon: Wylene Simmer, MD;  Location: [facility];  Service: Orthopedics;  Laterality: Left;  . PATELLA RECONSTRUCTION Left 2005  . PROSTATE ABLATION  2014  . TOE AMPUTATION     partial amputation right great toe  . WOUND DEBRIDEMENT Bilateral 11/30/2017   Procedure: DEBRIDEMENT WOUND;  Surgeon: Sharlotte Alamo, DPM;  Location: [facility];  Service: Podiatry;  Laterality: Bilateral;    Review of Systems  Constitutional: Negative for chills, fatigue and fever.  Respiratory: Negative for chest tightness, shortness of breath and wheezing.   Cardiovascular: Negative for chest pain and palpitations.  Skin: Positive for wound.      Objective:    BP (!) 166/89   Pulse 86   Temp 97.9 F (36.6 C)   Ht 5\' 10"  (1.778 m)   Wt 239 lb (108.4 kg)   BMI 34.29 kg/m  Nursing note and vital signs reviewed.  Physical Exam  Constitutional: He is oriented to person, place, and time. He [name] well-developed and well-nourished. No distress.  Cardiovascular: Normal rate, regular rhythm, normal heart sounds and intact distal pulses.  Pulmonary/Chest: Effort  normal and breath sounds normal.  Musculoskeletal:  Bilateral wounds with no obvious signs of infection, drainage or odor. Left foot slightly warm compared to the contralateral side.   Neurological: He is alert and oriented to person, place, and time.  Skin: Skin is warm and dry.  Psychiatric: He has a normal mood and affect. His behavior is normal. Judgment and thought content normal.             Assessment & Plan:   Problem List Items Addressed This Visit      Musculoskeletal and Integument   Osteomyelitis ([place]) - Primary    [name] [name] to be healing very well and is tolerating the levofloxacin with no diarrhea  or muscle pains. There is scant drainage on the dressings today with no obvious signs of infection. He has no signs of systemic infection. The left foot is slightly warmer compared to the right. I will check his inflammatory markers today. It [name] reasonable to continue the levofloxacin for 1 additional month with continued wound care to allow his wounds to resolve. Discussed importance of good glycemic control and protein intake to help his wounds to heal. Plan for follow up in 1 month or sooner if needed.       Relevant Orders   Sedimentation rate   C-reactive protein       I am having [name]. [place] maintain his aspirin EC, metFORMIN, docusate sodium, simvastatin, FARXIGA, BASAGLAR KWIKPEN, losartan, oxyCODONE-acetaminophen, clopidogrel, insulin aspart, TRADJENTA, HUMALOG KWIKPEN, and levofloxacin.   Follow-up: Return in about 1 month (around 02/23/2018), or if symptoms worsen or fail to improve.    Terri Piedra, MSN, FNP-C Nurse Practitioner [facility] Office phone: [phone] Pager: [place] number: [phone]

## 2018-01-23 NOTE — Patient Instructions (Signed)
Nice to meet you.  Your feet appear to be healing very well.  We will check your blood work today.   We will continue the levofloxacin for 1 additional month.  Please continue with wound care per Dr. Cleda Mccreedy.  Follow up with Marya Amsler or Dr. Tommy Medal in 1 month or sooner if needed.

## 2018-01-23 NOTE — Assessment & Plan Note (Signed)
Mr. Pasillas appears to be healing very well and is tolerating the levofloxacin with no diarrhea or muscle pains. There is scant drainage on the dressings today with no obvious signs of infection. He has no signs of systemic infection. The left foot is slightly warmer compared to the right. I will check his inflammatory markers today. It appears reasonable to continue the levofloxacin for 1 additional month with continued wound care to allow his wounds to resolve. Discussed importance of good glycemic control and protein intake to help his wounds to heal. Plan for follow up in 1 month or sooner if needed.

## 2018-01-24 LAB — SEDIMENTATION RATE: SED RATE: 55 mm/h — AB (ref 0–20)

## 2018-01-24 LAB — C-REACTIVE PROTEIN: CRP: 10.8 mg/L — AB (ref <8.0)

## 2018-01-26 NOTE — Progress Notes (Deleted)
Patient's Name: [name]  MRN: [id number]  Referring Provider: Arta Silence, MD  DOB: 08/20/1960  PCP: Lorelee Market, MD  DOS: 01/27/2018  Note by: Dionisio David NP  Service setting: Ambulatory outpatient  Specialty: Interventional Pain Management  Location: [facility] (AMB) Pain Management Facility    Patient type: New Patient    Primary Reason(s) for Visit: Initial Patient Evaluation CC: No chief complaint on file.  HPI  Mr. Vonada is a 57 y.o. year old, male patient, who comes today for an initial evaluation. 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 His primarily concern today is the No chief complaint on file.  Pain Assessment: Location:     Radiating:   Onset:   Duration:   Quality:   Severity:  /10 (subjective, self-reported pain score)  Note: Reported level is compatible with observation.                         When using our objective Pain Scale, levels between 6 and 10/10 are said to belong in an emergency room, as it progressively worsens from a 6/10, described as severely limiting, requiring emergency care not usually available at an outpatient pain management facility. At a 6/10 level, communication becomes difficult and requires great effort. Assistance to reach the emergency department may be required. Facial flushing and profuse sweating along with potentially dangerous increases in heart rate and blood pressure will be  evident. Effect on ADL:   Timing:   Modifying factors:   BP:    HR:    Onset and Duration: {Hx; Onset and Duration:210120511} Cause of pain: {Hx; Cause:210120521} Severity: {Pain Severity:210120502} Timing: {Symptoms; Timing:210120501} Aggravating Factors: {Causes; Aggravating pain factors:210120507} Alleviating Factors: {Causes; Alleviating Factors:210120500} Associated Problems: {Hx; Associated problems:210120515} Quality of Pain: {Hx; Symptom quality or Descriptor:210120531} Previous Examinations or Tests: {Hx; Previous examinations or test:210120529} Previous Treatments: {Hx; Previous Treatment:210120503}  The patient comes into the clinics today for the first time for a chronic pain management evaluation. ***  Today I took the time to provide the patient with information regarding this pain practice. The patient was informed that the practice is divided into two sections: an interventional pain management section, as well as a completely separate and distinct medication management section. I explained that there are procedure days for interventional therapies, and evaluation days for follow-ups and medication management. Because of the amount of documentation required during both, they are kept separated. This means that there is the possibility that *** may be scheduled for a procedure on one day, and medication management the next. I have also informed *** that because of staffing and facility limitations, this practice will no longer take patients for medication management only. To illustrate the reasons for this, I gave the patient the example of surgeons, and how inappropriate it would be to refer a patient to his/her care, just to write for the post-surgical antibiotics on a surgery done by a different surgeon.   Because interventional pain management is part of the board-certified specialty for the doctors, the patient was informed that  joining this practice means that they are open  to any and all interventional therapies. I made it clear that this does not mean that they will be forced to have any procedures done. What this means is that I believe interventional therapies to be essential part of the diagnosis and proper management of chronic pain conditions. Therefore, patients not interested in these interventional alternatives will be better served under the care of a different practitioner.  The patient was also made aware of my Comprehensive Pain Management Safety Guidelines where by joining this practice, they limit all of their nerve blocks and joint injections to those done by our practice, for as long as we are retained to manage their care. Historic Controlled Substance Pharmacotherapy Review  PMP and historical list of controlled substances: *** Highest opioid analgesic regimen found: *** Most recent opioid analgesic: *** Current opioid analgesics: *** Highest recorded MME/day: *** mg/day MME/day: *** mg/day Medications: The patient did not bring the medication(s) to the appointment, as requested in our "New Patient Package" Pharmacodynamics: Desired effects: Analgesia: The patient reports >50% benefit. Reported improvement in function: The patient reports medication allows him to accomplish basic ADLs. Clinically meaningful improvement in function (CMIF): Sustained CMIF goals met Perceived effectiveness: Described as relatively effective, allowing for increase in activities of daily living (ADL) Undesirable effects: Side-effects or Adverse reactions: None reported Historical Monitoring: The patient  reports that he has current or past drug history. Drugs: Marijuana and Cocaine. List of all UDS Test(s): No results found for: MDMA, COCAINSCRNUR, PCPSCRNUR, PCPQUANT, CANNABQUANT, THCU, [place] List of all Serum Drug Screening Test(s):  No results found for: AMPHSCRSER, BARBSCRSER, BENZOSCRSER, COCAINSCRSER, PCPSCRSER, PCPQUANT, THCSCRSER, CANNABQUANT, OPIATESCRSER,  OXYSCRSER, PROPOXSCRSER Historical Background Evaluation: [place] PDMP: Six (6) year initial data search conducted.             [place] Department of public safety, offender search: Editor, commissioning Information) Non-contributory Risk Assessment Profile: Aberrant behavior: None observed or detected today Risk factors for fatal opioid overdose: None identified today Fatal overdose hazard ratio (HR): Calculation deferred Non-fatal overdose hazard ratio (HR): Calculation deferred Risk of opioid abuse or dependence: 0.7-3.0% with doses ? 36 MME/day and 6.1-26% with doses ? 120 MME/day. Substance use disorder (SUD) risk level: Pending results of Medical Psychology Evaluation for SUD Opioid risk tool (ORT) (Total Score):    ORT Scoring interpretation table:  Score <3 = Low Risk for SUD  Score between 4-7 = Moderate Risk for SUD  Score >8 = High Risk for Opioid Abuse   PHQ-2 Depression Scale:  Total score:    PHQ-2 Scoring interpretation table: (Score and probability of major depressive disorder)  Score 0 = No depression  Score 1 = 15.4% Probability  Score 2 = 21.1% Probability  Score 3 = 38.4% Probability  Score 4 = 45.5% Probability  Score 5 = 56.4% Probability  Score 6 = 78.6% Probability   PHQ-9 Depression Scale:  Total score:    PHQ-9 Scoring interpretation table:  Score 0-4 = No depression  Score 5-9 = Mild depression  Score 10-14 = Moderate depression  Score 15-19 = Moderately severe depression  Score 20-27 = Severe depression (2.4 times higher risk of SUD and 2.89 times higher risk of overuse)   Pharmacologic Plan: Pending ordered tests and/or consults  Meds  The patient has a current medication list which includes the following prescription(s): aspirin ec, clopidogrel, docusate sodium, farxiga, humalog kwikpen, insulin aspart, basaglar kwikpen, levofloxacin, losartan, metformin, oxycodone-acetaminophen, simvastatin, and tradjenta, and the following Facility-Administered  Medications:  tdap.  Current Outpatient Medications on File Prior to Visit  Medication Sig  . aspirin EC 81 MG tablet Take 81 mg by mouth daily.  . clopidogrel (PLAVIX) 75 MG tablet Take 1 tablet (75 mg total) by mouth daily. (Patient not taking: Reported on 12/26/2017)  . docusate sodium (COLACE) 100 MG capsule Take 1 capsule (100 mg total) by mouth 2 (two) times daily. While taking narcotic pain medicine. (Patient not taking: Reported on 01/08/2018)  . FARXIGA 10 MG TABS tablet Take 1 tablet by mouth daily.  Marland Kitchen HUMALOG KWIKPEN 100 UNIT/ML KiwkPen Inject 0-15 Units into the skin 3 (three) times daily. MAX-45 units a day  . insulin aspart (NOVOLOG) 100 UNIT/ML injection Inject 0-15 Units into the skin 4 (four) times daily -  before meals and at bedtime. CBG 70 - 120: 0 units CBG 121 - 150: 2 units CBG 151 - 200: 3 units CBG 201 - 250: 5 units CBG 251 - 300: 8 units CBG 301 - 350: 11 units CBG 351 - 400: 15 units CBG > 400: call MD (Patient not taking: Reported on 01/23/2018)  . Insulin Glargine (BASAGLAR KWIKPEN) 100 UNIT/ML SOPN Inject 0.35 mLs (35 Units total) into the skin at bedtime.  Marland Kitchen levofloxacin (LEVAQUIN) 500 MG tablet Take 1 tablet (500 mg total) by mouth daily.  Marland Kitchen losartan (COZAAR) 50 MG tablet Take 50 mg by mouth daily.  . metFORMIN (GLUCOPHAGE) 1000 MG tablet Take 1,000 mg by mouth 2 (two) times daily.  Marland Kitchen oxyCODONE-acetaminophen (PERCOCET/ROXICET) 5-325 MG tablet Take 1-2 tablets by mouth every 4 (four) hours as needed for severe pain.  . simvastatin (ZOCOR) 20 MG tablet TAKE 1 TABLET BY MOUTH IN THE EVENING  . TRADJENTA 5 MG TABS tablet Take 1 tablet by mouth daily.   Current Facility-Administered Medications on File Prior to Visit  Medication  . Tdap (BOOSTRIX) injection 0.5 mL   Imaging Review  Cervical Imaging: Cervical MR wo contrast: No results found for this or any previous visit. Cervical MR wo contrast: No procedure found. Cervical MR w/wo contrast: No results found for this or  any previous visit. Cervical MR w contrast: No results found for this or any previous visit. Cervical CT wo contrast: No results found for this or any previous visit. Cervical CT w/wo contrast: No results found for this or any previous visit. Cervical CT w/wo contrast: No results found for this or any previous visit. Cervical CT w contrast: No results found for this or any previous visit. Cervical CT outside: No results found for this or any previous visit. Cervical DG 1 view: No results found for this or any previous visit. Cervical DG 2-3 views: No results found for this or any previous visit. Cervical DG F/E views: No results found for this or any previous visit. Cervical DG 2-3 clearing views: No results found for this or any previous visit. Cervical DG Bending/F/E views: No results found for this or any previous visit. Cervical DG complete: No results found for this or any previous visit. Cervical DG Myelogram views: No results found for this or any previous visit. Cervical DG Myelogram views: No results found for this or any previous visit. Cervical Discogram views: No results found for this or any previous visit.  Shoulder Imaging: Shoulder-R MR w contrast: No results found for this or any previous visit. Shoulder-L MR w contrast: No results found for this or any previous visit. Shoulder-R MR w/wo contrast: No results found for this or any  previous visit. Shoulder-L MR w/wo contrast: No results found for this or any previous visit. Shoulder-R MR wo contrast: No results found for this or any previous visit. Shoulder-L MR wo contrast: No results found for this or any previous visit. Shoulder-R CT w contrast: No results found for this or any previous visit. Shoulder-L CT w contrast: No results found for this or any previous visit. Shoulder-R CT w/wo contrast: No results found for this or any previous visit. Shoulder-L CT w/wo contrast: No results found for this or any previous  visit. Shoulder-R CT wo contrast: No results found for this or any previous visit. Shoulder-L CT wo contrast: No results found for this or any previous visit. Shoulder-R DG Arthrogram: No results found for this or any previous visit. Shoulder-L DG Arthrogram: No results found for this or any previous visit. Shoulder-R DG 1 view: No results found for this or any previous visit. Shoulder-L DG 1 view: No results found for this or any previous visit. Shoulder-R DG: No results found for this or any previous visit. Shoulder-L DG: No results found for this or any previous visit.  Thoracic Imaging: Thoracic MR wo contrast: No results found for this or any previous visit. Thoracic MR wo contrast: No procedure found. Thoracic MR w/wo contrast: No results found for this or any previous visit. Thoracic MR w contrast: No results found for this or any previous visit. Thoracic CT wo contrast: No results found for this or any previous visit. Thoracic CT w/wo contrast: No results found for this or any previous visit. Thoracic CT w/wo contrast: No results found for this or any previous visit. Thoracic CT w contrast: No results found for this or any previous visit. Thoracic DG 2-3 views: No results found for this or any previous visit. Thoracic DG 4 views: No results found for this or any previous visit. Thoracic DG: No results found for this or any previous visit. Thoracic DG w/swimmers view: No results found for this or any previous visit. Thoracic DG Myelogram views: No results found for this or any previous visit. Thoracic DG Myelogram views: No results found for this or any previous visit.  Lumbosacral Imaging: Lumbar MR wo contrast: No results found for this or any previous visit. Lumbar MR wo contrast: No procedure found. Lumbar MR w/wo contrast: No results found for this or any previous visit. Lumbar MR w contrast: No results found for this or any previous visit. Lumbar CT wo contrast: No results  found for this or any previous visit. Lumbar CT w/wo contrast: No results found for this or any previous visit. Lumbar CT w/wo contrast: No results found for this or any previous visit. Lumbar CT w contrast: No results found for this or any previous visit. Lumbar DG 1V: No results found for this or any previous visit. Lumbar DG 1V (Clearing): No results found for this or any previous visit. Lumbar DG 2-3V (Clearing): No results found for this or any previous visit. Lumbar DG 2-3 views: No results found for this or any previous visit. Lumbar DG (Complete) 4+V:  Results for orders placed during the hospital encounter of 05/23/15  DG Lumbar Spine Complete   Narrative CLINICAL DATA:  Progressive lumbago without radicular symptoms.  EXAM: LUMBAR SPINE - COMPLETE 4+ VIEW  COMPARISON:  None.  FINDINGS: Frontal, lateral, spot lumbosacral lateral, and bilateral oblique views were obtained. There are 5 non-rib-bearing lumbar type vertebral bodies. The S1 vertebra is transitional with assimilation joints bilaterally at S1-2 and a disc  space at S1-2 noted. There is no fracture or spondylolisthesis. Disc spaces appear intact. There is no appreciable facet arthropathy. There are foci of arterial vascular calcification in the pelvis.  IMPRESSION: No fracture or spondylolisthesis. No appreciable arthropathic change. Note transitional S1 vertebra. Areas of atherosclerotic vascular calcification in the pelvis.   Electronically Signed   By: Lowella Grip III M.D.   On: 05/23/2015 11:42    Lumbar DG F/E views: No results found for this or any previous visit. Lumbar DG Bending views: No results found for this or any previous visit. Lumbar DG Myelogram views: No results found for this or any previous visit. Lumbar DG Myelogram: No results found for this or any previous visit. Lumbar DG Myelogram: No results found for this or any previous visit. Lumbar DG Myelogram: No results found for this  or any previous visit. Lumbar DG Myelogram Lumbosacral: No results found for this or any previous visit. Lumbar DG Diskogram views: No results found for this or any previous visit. Lumbar DG Diskogram views: No results found for this or any previous visit. Lumbar DG Epidurogram OP: No results found for this or any previous visit. Lumbar DG Epidurogram IP: No results found for this or any previous visit.  Sacroiliac Joint Imaging: Sacroiliac Joint DG: No results found for this or any previous visit. Sacroiliac Joint MR w/wo contrast: No results found for this or any previous visit. Sacroiliac Joint MR wo contrast: No results found for this or any previous visit.  Spine Imaging: Whole Spine DG Myelogram views: No results found for this or any previous visit. Whole Spine MR Mets screen: No results found for this or any previous visit. Whole Spine MR Mets screen: No results found for this or any previous visit. Whole Spine MR w/wo: No results found for this or any previous visit. MRA Spinal Canal w/ cm: No results found for this or any previous visit. MRA Spinal Canal wo/ cm: No procedure found. MRA Spinal Canal w/wo cm: No results found for this or any previous visit. Spine Outside MR Films: No results found for this or any previous visit. Spine Outside CT Films: No results found for this or any previous visit. CT-Guided Biopsy: No results found for this or any previous visit. CT-Guided Needle Placement: No results found for this or any previous visit. DG Spine outside: No results found for this or any previous visit. IR Spine outside: No results found for this or any previous visit. NM Spine outside: No results found for this or any previous visit.  Hip Imaging: Hip-R MR w contrast: No results found for this or any previous visit. Hip-L MR w contrast: No results found for this or any previous visit. Hip-R MR w/wo contrast: No results found for this or any previous visit. Hip-L MR w/wo  contrast: No results found for this or any previous visit. Hip-R MR wo contrast: No results found for this or any previous visit. Hip-L MR wo contrast: No results found for this or any previous visit. Hip-R CT w contrast: No results found for this or any previous visit. Hip-L CT w contrast: No results found for this or any previous visit. Hip-R CT w/wo contrast: No results found for this or any previous visit. Hip-L CT w/wo contrast: No results found for this or any previous visit. Hip-R CT wo contrast: No results found for this or any previous visit. Hip-L CT wo contrast: No results found for this or any previous visit. Hip-R DG 2-3 views:  No results found for this or any previous visit. Hip-L DG 2-3 views: No results found for this or any previous visit. Hip-R DG Arthrogram: No results found for this or any previous visit. Hip-L DG Arthrogram: No results found for this or any previous visit. Hip-B DG Bilateral: No results found for this or any previous visit.  Knee Imaging: Knee-R MR w contrast: No results found for this or any previous visit. Knee-L MR w contrast: No results found for this or any previous visit. Knee-R MR w/wo contrast: No results found for this or any previous visit. Knee-L MR w/wo contrast: No results found for this or any previous visit. Knee-R MR wo contrast: No results found for this or any previous visit. Knee-L MR wo contrast: No results found for this or any previous visit. Knee-R CT w contrast: No results found for this or any previous visit. Knee-L CT w contrast: No results found for this or any previous visit. Knee-R CT w/wo contrast: No results found for this or any previous visit. Knee-L CT w/wo contrast: No results found for this or any previous visit. Knee-R CT wo contrast: No results found for this or any previous visit. Knee-L CT wo contrast: No results found for this or any previous visit. Knee-R DG 1-2 views: No results found for this or any previous  visit. Knee-L DG 1-2 views: No results found for this or any previous visit. Knee-R DG 3 views: No results found for this or any previous visit. Knee-L DG 3 views: No results found for this or any previous visit. Knee-R DG 4 views: No results found for this or any previous visit. Knee-L DG 4 views: No results found for this or any previous visit. Knee-R DG Arthrogram: No results found for this or any previous visit. Knee-L DG Arthrogram: No results found for this or any previous visit.  Note: Available results from prior imaging studies were reviewed.        ROS  Cardiovascular History: {Hx; Cardiovascular History:210120525} Pulmonary or Respiratory History: {Hx; Pumonary and/or Respiratory History:210120523} Neurological History: {Hx; Neurological:210120504} Review of Past Neurological Studies: No results found for this or any previous visit. Psychological-Psychiatric History: {Hx; Psychological-Psychiatric History:210120512} Gastrointestinal History: {Hx; Gastrointestinal:210120527} Genitourinary History: {Hx; Genitourinary:210120506} Hematological History: {Hx; Hematological:210120510} Endocrine History: {Hx; Endocrine history:210120509} Rheumatologic History: {Hx; Rheumatological:210120530} Musculoskeletal History: {Hx; Musculoskeletal:210120528} Work History: {Hx; Work history:210120514}  Allergies  Mr. Bullinger is allergic to bactrim [sulfamethoxazole-trimethoprim].  Laboratory Chemistry  Inflammation Markers Lab Results  Component Value Date   CRP 10.8 (H) 01/23/2018   ESRSEDRATE 55 (H) 01/23/2018   (CRP: Acute Phase) (ESR: Chronic Phase) Renal Function Markers Lab Results  Component Value Date   BUN 21 (H) 01/08/2018   CREATININE 1.31 (H) 01/08/2018   GFRAA >60 01/08/2018   GFRNONAA 59 (L) 01/08/2018   Hepatic Function Markers Lab Results  Component Value Date   AST 45 (H) 01/08/2018   ALT 22 01/08/2018   ALBUMIN 3.8 01/08/2018   ALKPHOS 99 01/08/2018    Electrolytes Lab Results  Component Value Date   NA 139 01/08/2018   K 3.4 (L) 01/08/2018   CL 102 01/08/2018   CALCIUM 9.0 01/08/2018   Neuropathy Markers No results found for: [id number] Bone Pathology Markers Lab Results  Component Value Date   ALKPHOS 99 01/08/2018   CALCIUM 9.0 01/08/2018   Coagulation Parameters Lab Results  Component Value Date   INR 1.05 01/08/2018   LABPROT 13.6 01/08/2018   PLT 273 01/08/2018   Cardiovascular Markers  Lab Results  Component Value Date   HGB 14.3 01/08/2018   HCT 41.5 01/08/2018   Note: Lab results reviewed.  PFSH  Drug: Mr. Gullett  reports that he has current or past drug history. Drugs: Marijuana and Cocaine. Alcohol:  reports that he drinks alcohol. Tobacco:  reports that he has never smoked. He has never used smokeless tobacco. Medical:  has a past medical history of Arthritis, Bacterial infection due to Morganella morganii (12/26/2017), BPH (benign prostatic hypertrophy), Diabetes mellitus, Diabetic neuropathy ([place]), Hypertension, Metatarsal bone fracture, Osteomyelitis of toe of right foot ([place]), Post-operative infection, Sepsis due to Streptococcus, group B ([place]) (12/26/2017), Sleep apnea, and Wears glasses. Family: family history includes Diabetes in his mother and other.  Past Surgical History:  Procedure Laterality Date  . COLONOSCOPY W/ BIOPSIES AND POLYPECTOMY    . I&D EXTREMITY Left 01/11/2016   Procedure: LEFT FOOT IRRIGATION AND DEBRIDEMENT WOUND VAC AND REMOVAL OF HARDWARE;  Surgeon: Wylene Simmer, MD;  Location: [place];  Service: Orthopedics;  Laterality: Left;  . LOWER EXTREMITY ANGIOGRAPHY N/A 12/01/2017   Procedure: Lower Extremity Angiography;  Surgeon: Algernon Huxley, MD;  Location: [facility];  Service: Cardiovascular;  Laterality: N/A;  . LOWER EXTREMITY ANGIOGRAPHY Right 12/04/2017   Procedure: Lower Extremity Angiography;  Surgeon: Algernon Huxley, MD;  Location: [facility];  Service:  Cardiovascular;  Laterality: Right;  . ORIF TOE FRACTURE Left 06/08/2015   Procedure: OPEN REDUCTION INTERNAL FIXATION (ORIF) LEFT FIFTH METATARSAL BASE FRACTURE NONUNION; CALCANEAL AUTOGRAFT ;  Surgeon: Wylene Simmer, MD;  Location: [place];  Service: Orthopedics;  Laterality: Left;  . PATELLA RECONSTRUCTION Left 2005  . PROSTATE ABLATION  2014  . TOE AMPUTATION     partial amputation right great toe  . WOUND DEBRIDEMENT Bilateral 11/30/2017   Procedure: DEBRIDEMENT WOUND;  Surgeon: Sharlotte Alamo, DPM;  Location: [facility];  Service: Podiatry;  Laterality: Bilateral;   Active Ambulatory Problems    Diagnosis Date Noted  . 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 Metatarsal stress fracture of left foot 01/12/2016  . Diabetes mellitus with diabetic polyneuropathy ([place]) 01/12/2016  . Diabetic foot infection ([place]) 01/12/2016  . Hardware complicating wound infection ([place])   . Diabetes mellitus due to underlying condition, uncontrolled, with diabetic neuropathy ([place])   . Surgery, elective   . Cellulitis of left foot 03/05/2017  . HTN (hypertension) 11/29/2017  . HLD (hyperlipidemia) 11/29/2017  . Osteomyelitis ([place]) 11/29/2017  . Bacterial infection due to Morganella morganii 12/26/2017  . Sepsis due to Streptococcus, group B ([place]) 12/26/2017   Resolved Ambulatory Problems    Diagnosis Date Noted  . No Resolved Ambulatory Problems   Past Medical History:  Diagnosis Date  . Arthritis   . Bacterial infection due to Morganella morganii 12/26/2017  . BPH (benign prostatic hypertrophy)   . Diabetes mellitus   . Diabetic neuropathy ([place])   . Hypertension   . Metatarsal bone fracture   . Osteomyelitis of toe of right foot ([place])   . Post-operative infection   . Sepsis due to Streptococcus, group B ([place]) 12/26/2017  . Sleep apnea   . Wears glasses    Constitutional Exam  General appearance: Well nourished,  well developed, and well hydrated. In no apparent acute distress There were no vitals filed for this visit. BMI Assessment: Estimated body mass index is 34.29 kg/m as calculated from the following:   Height as  of 01/23/18: '5\' 10"'$  (1.778 m).   Weight as of 01/23/18: 239 lb (108.4 kg).  BMI interpretation table: BMI level Category Range association with higher incidence of chronic pain  <18 kg/m2 Underweight   18.5-24.9 kg/m2 Ideal body weight   25-29.9 kg/m2 Overweight Increased incidence by 20%  30-34.9 kg/m2 Obese (Class I) Increased incidence by 68%  35-39.9 kg/m2 Severe obesity (Class II) Increased incidence by 136%  >40 kg/m2 Extreme obesity (Class III) Increased incidence by 254%   BMI Readings from Last 4 Encounters:  01/23/18 34.29 kg/m  01/08/18 34.29 kg/m  12/26/17 34.29 kg/m  12/04/17 35.30 kg/m   Wt Readings from Last 4 Encounters:  01/23/18 239 lb (108.4 kg)  01/08/18 239 lb (108.4 kg)  12/26/17 239 lb (108.4 kg)  12/04/17 246 lb (111.6 kg)  Psych/Mental status: Alert, oriented x 3 (person, place, & time)       Eyes: PERLA Respiratory: No evidence of acute respiratory distress  Cervical Spine Exam  Inspection: No masses, redness, or swelling Alignment: Symmetrical Functional ROM: Unrestricted ROM      Stability: No instability detected Muscle strength & Tone: Functionally intact Sensory: Unimpaired Palpation: No palpable anomalies              Upper Extremity (UE) Exam    Side: Right upper extremity  Side: Left upper extremity  Inspection: No masses, redness, swelling, or asymmetry. No contractures  Inspection: No masses, redness, swelling, or asymmetry. No contractures  Functional ROM: Unrestricted ROM          Functional ROM: Unrestricted ROM          Muscle strength & Tone: Functionally intact  Muscle strength & Tone: Functionally intact  Sensory: Unimpaired  Sensory: Unimpaired  Palpation: No palpable anomalies              Palpation: No palpable  anomalies              Specialized Test(s): Deferred         Specialized Test(s): Deferred          Thoracic Spine Exam  Inspection: No masses, redness, or swelling Alignment: Symmetrical Functional ROM: Unrestricted ROM Stability: No instability detected Sensory: Unimpaired Muscle strength & Tone: No palpable anomalies  Lumbar Spine Exam  Inspection: No masses, redness, or swelling Alignment: Symmetrical Functional ROM: Unrestricted ROM      Stability: No instability detected Muscle strength & Tone: Functionally intact Sensory: Unimpaired Palpation: No palpable anomalies       Provocative Tests: Lumbar Hyperextension and rotation test: evaluation deferred today       Patrick's Maneuver: evaluation deferred today                    Gait & Posture Assessment  Ambulation: Unassisted Gait: Relatively normal for age and body habitus Posture: WNL   Lower Extremity Exam    Side: Right lower extremity  Side: Left lower extremity  Inspection: No masses, redness, swelling, or asymmetry. No contractures  Inspection: No masses, redness, swelling, or asymmetry. No contractures  Functional ROM: Unrestricted ROM          Functional ROM: Unrestricted ROM          Muscle strength & Tone: Functionally intact  Muscle strength & Tone: Functionally intact  Sensory: Unimpaired  Sensory: Unimpaired  Palpation: No palpable anomalies  Palpation: No palpable anomalies   Assessment  Primary Diagnosis & Pertinent Problem List: There were no encounter diagnoses.  Visit Diagnosis: No diagnosis  found. Plan of Care  Initial treatment plan:  Please be advised that as per protocol, today's visit has been an evaluation only. We have not taken over the patient's controlled substance management.  Problem-specific plan: No problem-specific Assessment & Plan notes found for this encounter.  Ordered Lab-work, Procedure(s), Referral(s), & Consult(s): No orders of the defined types were placed in this  encounter.  Pharmacotherapy: Medications ordered:  No orders of the defined types were placed in this encounter.  Medications administered during this visit: [place]ah Vandergriff. Starace had no medications administered during this visit.   Pharmacotherapy under consideration:  Opioid Analgesics: The patient was informed that there is no guarantee that he would be a candidate for opioid analgesics. The decision will be made following CDC guidelines. This decision will be based on the results of diagnostic studies, as well as Mr. Rhodes risk profile.  Membrane stabilizer: To be determined at a later time Muscle relaxant: To be determined at a later time NSAID: To be determined at a later time Other analgesic(s): To be determined at a later time   Interventional therapies under consideration: Mr. Smoak was informed that there is no guarantee that he would be a candidate for interventional therapies. The decision will be based on the results of diagnostic studies, as well as Mr. Jenkinson risk profile.  Possible procedure(s): ***   Provider-requested follow-up: No follow-ups on file.  Future Appointments  Date Time Provider [place]  01/27/2018  8:00 AM Vevelyn Francois, NP [facility] None  02/13/2018  9:45 AM Lucky Cowboy, Erskine Squibb, MD [facility] None  03/03/2018 10:00 AM Golden Circle, FNP [facility]    Primary Care Physician: Lorelee Market, MD Location: [facility] Note by:  Date: 01/27/2018; Time: 3:28 PM  Pain Score Disclaimer: We use the NRS-11 scale. This is a self-reported, subjective measurement of pain severity with only modest accuracy. It is used primarily to identify changes within a particular patient. It must be understood that outpatient pain scales are significantly less accurate that those used for research, where they can be applied under ideal controlled circumstances with minimal exposure to variables. In reality, the score is likely to be a  combination of pain intensity and pain affect, where pain affect describes the degree of emotional arousal or changes in action readiness caused by the sensory experience of pain. Factors such as social and work situation, setting, emotional state, anxiety levels, expectation, and prior pain experience may influence pain perception and show large inter-individual differences that may also be affected by time variables.  Patient instructions provided during this appointment: There are no Patient Instructions on file for this visit.

## 2018-01-27 ENCOUNTER — Ambulatory Visit: Payer: BLUE CROSS/BLUE SHIELD | Admitting: Nurse Practitioner

## 2018-02-10 ENCOUNTER — Other Ambulatory Visit: Payer: Self-pay

## 2018-02-10 ENCOUNTER — Ambulatory Visit: Payer: BLUE CROSS/BLUE SHIELD | Attending: Nurse Practitioner | Admitting: Nurse Practitioner

## 2018-02-10 ENCOUNTER — Encounter: Payer: Self-pay | Admitting: Nurse Practitioner

## 2018-02-10 DIAGNOSIS — G8929 Other chronic pain: Secondary | ICD-10-CM | POA: Insufficient documentation

## 2018-02-10 DIAGNOSIS — Z794 Long term (current) use of insulin: Secondary | ICD-10-CM | POA: Insufficient documentation

## 2018-02-10 DIAGNOSIS — M79672 Pain in left foot: Secondary | ICD-10-CM | POA: Diagnosis not present

## 2018-02-10 DIAGNOSIS — M79671 Pain in right foot: Secondary | ICD-10-CM | POA: Diagnosis not present

## 2018-02-10 DIAGNOSIS — G894 Chronic pain syndrome: Secondary | ICD-10-CM | POA: Insufficient documentation

## 2018-02-10 DIAGNOSIS — Z8781 Personal history of (healed) traumatic fracture: Secondary | ICD-10-CM | POA: Insufficient documentation

## 2018-02-10 DIAGNOSIS — Z789 Other specified health status: Secondary | ICD-10-CM | POA: Diagnosis not present

## 2018-02-10 DIAGNOSIS — Z8739 Personal history of other diseases of the musculoskeletal system and connective tissue: Secondary | ICD-10-CM | POA: Insufficient documentation

## 2018-02-10 DIAGNOSIS — Z79899 Other long term (current) drug therapy: Secondary | ICD-10-CM | POA: Insufficient documentation

## 2018-02-10 DIAGNOSIS — Z89411 Acquired absence of right great toe: Secondary | ICD-10-CM | POA: Diagnosis not present

## 2018-02-10 DIAGNOSIS — G4730 Sleep apnea, unspecified: Secondary | ICD-10-CM | POA: Diagnosis not present

## 2018-02-10 DIAGNOSIS — M899 Disorder of bone, unspecified: Secondary | ICD-10-CM | POA: Insufficient documentation

## 2018-02-10 DIAGNOSIS — Z79891 Long term (current) use of opiate analgesic: Secondary | ICD-10-CM | POA: Insufficient documentation

## 2018-02-10 DIAGNOSIS — E1142 Type 2 diabetes mellitus with diabetic polyneuropathy: Secondary | ICD-10-CM | POA: Insufficient documentation

## 2018-02-10 DIAGNOSIS — I10 Essential (primary) hypertension: Secondary | ICD-10-CM | POA: Insufficient documentation

## 2018-02-10 DIAGNOSIS — E785 Hyperlipidemia, unspecified: Secondary | ICD-10-CM | POA: Insufficient documentation

## 2018-02-10 DIAGNOSIS — Z833 Family history of diabetes mellitus: Secondary | ICD-10-CM | POA: Diagnosis not present

## 2018-02-10 DIAGNOSIS — Z89421 Acquired absence of other right toe(s): Secondary | ICD-10-CM | POA: Insufficient documentation

## 2018-02-10 DIAGNOSIS — M79605 Pain in left leg: Secondary | ICD-10-CM

## 2018-02-10 DIAGNOSIS — Z882 Allergy status to sulfonamides status: Secondary | ICD-10-CM | POA: Diagnosis not present

## 2018-02-10 DIAGNOSIS — Z89422 Acquired absence of other left toe(s): Secondary | ICD-10-CM | POA: Insufficient documentation

## 2018-02-10 DIAGNOSIS — M79604 Pain in right leg: Secondary | ICD-10-CM | POA: Diagnosis not present

## 2018-02-10 DIAGNOSIS — Z7982 Long term (current) use of aspirin: Secondary | ICD-10-CM | POA: Diagnosis not present

## 2018-02-10 DIAGNOSIS — M1990 Unspecified osteoarthritis, unspecified site: Secondary | ICD-10-CM | POA: Insufficient documentation

## 2018-02-10 NOTE — Progress Notes (Addendum)
Patient's Name: [name]  MRN: [id number]  Referring Provider: Arta Silence, MD  DOB: 18-Aug-1960  PCP: Lorelee Market, MD  DOS: 02/10/2018  Note by: Dionisio David NP  Service setting: Ambulatory outpatient  Specialty: Interventional Pain Management  Location: [facility] (AMB) Pain Management Facility    Patient type: New Patient    Primary Reason(s) for Visit: Initial Patient Evaluation CC: Foot Pain (both)  HPI  [name] is a 57 y.o. year old, male patient, who comes today for an initial evaluation. 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, with necrosis of muscle ([name]); Metatarsal stress fracture of left foot; Diabetes mellitus with diabetic polyneuropathy ([name]); Diabetic foot infection ([name]); Hardware complicating wound infection ([name]); Diabetes mellitus due to underlying condition, uncontrolled, with diabetic neuropathy ([name]); Surgery, elective; Cellulitis of left foot; HTN (hypertension); HLD (hyperlipidemia); Osteomyelitis ([name]); Bacterial infection due to Morganella morganii; Sepsis due to Streptococcus, group B ([name]); Bilateral foot pain (Primary Area of Pain) (L>R); Chronic pain of both lower extremities (Secondary Area of Pain) (L>R); Chronic pain syndrome; Long term current use of opiate analgesic; Pharmacologic therapy; Disorder of skeletal system; and Problems influencing health status on their problem list.. His primarily concern today is the Foot Pain (both)  Pain Assessment: Location: Right, Left Foot Radiating: Denies Onset: More than a month ago Duration: Chronic pain Quality: Aching, Burning, Throbbing, Tingling, Stabbing, Nagging, Discomfort, Shooting Severity: 7 /10 (subjective, self-reported pain score)  Note: Reported level is compatible with observation. Clinically the patient looks like a 2/10 A 2/10 is viewed as "Mild to Moderate" and described as noticeable and distracting. Impossible to hide from other people. More frequent  flare-ups. Still possible to adapt and function close to normal. It can be very annoying and may have occasional stronger flare-ups. With discipline, patients may get used to it and adapt. Information on the proper use of the pain scale provided to the patient today. When using our objective Pain Scale, levels between 6 and 10/10 are said to belong in an emergency room, as it progressively worsens from a 6/10, described as severely limiting, requiring emergency care not usually available at an outpatient pain management facility. At a 6/10 level, communication becomes difficult and requires great effort. Assistance to reach the emergency department may be required. Facial flushing and profuse sweating along with potentially dangerous increases in heart rate and blood pressure will be evident. Effect on ADL: limits my daily activities Timing: Intermittent Modifying factors: nothing BP: (!) 149/93  HR: 94  Onset and Duration: Date of onset: 12/27/2014 Cause of pain: broke foot Severity: Getting better Timing: Morning, Afternoon, Night, During activity or exercise, After activity or exercise and After a period of immobility Aggravating Factors: Walking Alleviating Factors: Sitting Associated Problems: Spasms, Tingling, Pain that wakes patient up and Pain that does not allow patient to sleep Quality of Pain: Aching, Annoying, Burning, Disabling, Nagging, Pressure-like, Punishing, Sharp and Tingling Previous Examinations or Tests: Biopsy, MRI scan and X-rays Previous Treatments: Narcotic medications  The patient comes into the clinics today for the first time for a chronic pain management evaluation.  According to the patient his primary area of pain is in his feet.  He admits the left greater than the right.  He admits that he does have burning and numbness and tingling. He admits that he fractured his left foot approximately 3 years ago and has had increased complications since then.  He is status  post several foot surgeries with Dr. Caryl Comes.  He has had osteomyelitis.  He is status post amputation of fifth toe on his left foot and amputation of right foot great toe and fifth toe . He does have diabetes and neuropathy.  He is currently being seen at the wound clinic for decreased healing.  He has had recent images.   Today I took the time to provide the patient with information regarding this pain practice. The patient was informed that the practice is divided into two sections: an interventional pain management section, as well as a completely separate and distinct medication management section. I explained that there are procedure days for interventional therapies, and evaluation days for follow-ups and medication management. Because of the amount of documentation required during both, they are kept separated. This means that there is the possibility that  he may be scheduled for a procedure on one day, and medication management the next. I have also informed  him that because of staffing and facility limitations, this practice will no longer take patients for medication management only. To illustrate the reasons for this, I gave the patient the example of surgeons, and how inappropriate it would be to refer a patient to his/her care, just to write for the post-surgical antibiotics on a surgery done by a different surgeon.   Because interventional pain management is part of the board-certified specialty for the doctors, the patient was informed that joining this practice means that they are open to any and all interventional therapies. I made it clear that this does not mean that they will be forced to have any procedures done. What this means is that I believe interventional therapies to be essential part of the diagnosis and proper management of chronic pain conditions. Therefore, patients not interested in these interventional alternatives will be better served under the care of a different  practitioner.  The patient was also made aware of my Comprehensive Pain Management Safety Guidelines where by joining this practice, they limit all of their nerve blocks and joint injections to those done by our practice, for as long as we are retained to manage their care. Historic Controlled Substance Pharmacotherapy Review  PMP and historical list of controlled substances: tramadol 50 mg, oxycodone/acetaminophen 5/325 mg, oxycodone 5 mg, hydrocodone/acetaminophen 5/325 mg, AndroGel 1.62% Highest opioid analgesic regimen found: oxycodone/acetaminophen 5/325 mg 2 tablets 5 times daily (fill date 01/07/2016) oxycodone 50 mg per day Most recent opioid analgesic: tramadol 50 mg 1 tablet 4 times daily (fill date 11/05/2017)tramadol 200 milligrams per day Current opioid analgesics: none Highest recorded MME/day: 75 mg/day MME/day: 0 mg/day Medications: The patient did not bring the medication(s) to the appointment, as requested in our "New Patient Package" Pharmacodynamics: Desired effects: Analgesia: The patient reports >50% benefit. Reported improvement in function: The patient reports medication allows him to accomplish basic ADLs. Clinically meaningful improvement in function (CMIF): Sustained CMIF goals met Perceived effectiveness: Described as relatively effective, allowing for increase in activities of daily living (ADL) Undesirable effects: Side-effects or Adverse reactions: None reported Historical Monitoring: The patient  reports that he has current or past drug history. Drugs: Marijuana and Cocaine. List of all UDS Test(s): No results found for: MDMA, COCAINSCRNUR, PCPSCRNUR, PCPQUANT, CANNABQUANT, THCU, [place] List of all Serum Drug Screening Test(s):  No results found for: AMPHSCRSER, BARBSCRSER, BENZOSCRSER, COCAINSCRSER, PCPSCRSER, PCPQUANT, THCSCRSER, CANNABQUANT, OPIATESCRSER, OXYSCRSER, PROPOXSCRSER Historical Background Evaluation: [place] PDMP: Six (6) year initial data search  conducted.             [place] Department of public  safety, offender search: Editor, commissioning Information) Non-contributory Risk Assessment Profile: Aberrant behavior: None observed or detected today Risk factors for fatal opioid overdose: None identified today Fatal overdose hazard ratio (HR): Calculation deferred Non-fatal overdose hazard ratio (HR): Calculation deferred Risk of opioid abuse or dependence: 0.7-3.0% with doses ? 36 MME/day and 6.1-26% with doses ? 120 MME/day. Substance use disorder (SUD) risk level: Pending results of Medical Psychology Evaluation for SUD Opioid risk tool (ORT) (Total Score): 7  ORT Scoring interpretation table:  Score <3 = Low Risk for SUD  Score between 4-7 = Moderate Risk for SUD  Score >8 = High Risk for Opioid Abuse   PHQ-2 Depression Scale:  Total score: 0  PHQ-2 Scoring interpretation table: (Score and probability of major depressive disorder)  Score 0 = No depression  Score 1 = 15.4% Probability  Score 2 = 21.1% Probability  Score 3 = 38.4% Probability  Score 4 = 45.5% Probability  Score 5 = 56.4% Probability  Score 6 = 78.6% Probability   PHQ-9 Depression Scale:  Total score: 0  PHQ-9 Scoring interpretation table:  Score 0-4 = No depression  Score 5-9 = Mild depression  Score 10-14 = Moderate depression  Score 15-19 = Moderately severe depression  Score 20-27 = Severe depression (2.4 times higher risk of SUD and 2.89 times higher risk of overuse)   Pharmacologic Plan: Pending ordered tests and/or consults  Meds  The patient has a current medication list which includes the following prescription(s): aspirin ec, farxiga, humalog kwikpen, basaglar kwikpen, levofloxacin, losartan, metformin, and tradjenta, and the following Facility-Administered Medications: tdap.  Current Outpatient Medications on File Prior to Visit  Medication Sig  . aspirin EC 81 MG tablet Take 81 mg by mouth daily.  Marland Kitchen FARXIGA 10 MG TABS tablet Take 1 tablet by mouth daily.   Marland Kitchen HUMALOG KWIKPEN 100 UNIT/ML KiwkPen Inject 0-15 Units into the skin 3 (three) times daily. MAX-45 units a day  . Insulin Glargine (BASAGLAR KWIKPEN) 100 UNIT/ML SOPN Inject 0.35 mLs (35 Units total) into the skin at bedtime.  Marland Kitchen levofloxacin (LEVAQUIN) 500 MG tablet Take 1 tablet (500 mg total) by mouth daily.  Marland Kitchen losartan (COZAAR) 50 MG tablet Take 50 mg by mouth daily.  . metFORMIN (GLUCOPHAGE) 1000 MG tablet Take 1,000 mg by mouth 2 (two) times daily.  . TRADJENTA 5 MG TABS tablet Take 1 tablet by mouth daily.   Current Facility-Administered Medications on File Prior to Visit  Medication  . Tdap (BOOSTRIX) injection 0.5 mL   Imaging Review  Lumbosacral Imaging:  Results for orders placed during the hospital encounter of 05/23/15  DG Lumbar Spine Complete   Narrative CLINICAL DATA:  Progressive lumbago without radicular symptoms.  EXAM: LUMBAR SPINE - COMPLETE 4+ VIEW  COMPARISON:  None.  FINDINGS: Frontal, lateral, spot lumbosacral lateral, and bilateral oblique views were obtained. There are 5 non-rib-bearing lumbar type vertebral bodies. The S1 vertebra is transitional with assimilation joints bilaterally at S1-2 and a disc space at S1-2 noted. There is no fracture or spondylolisthesis. Disc spaces appear intact. There is no appreciable facet arthropathy. There are foci of arterial vascular calcification in the pelvis.  IMPRESSION: No fracture or spondylolisthesis. No appreciable arthropathic change. Note transitional S1 vertebra. Areas of atherosclerotic vascular calcification in the pelvis.   Electronically Signed   By: Lowella Grip III M.D.   On: 05/23/2015 11:42   Note: Available results from prior imaging studies were reviewed.        ROS  Cardiovascular History: No reported cardiovascular signs or symptoms such as High blood pressure, coronary artery disease, abnormal heart rate or rhythm, heart attack, blood thinner therapy or heart weakness and/or  failure Pulmonary or Respiratory History: No reported pulmonary signs or symptoms such as wheezing and difficulty taking a deep full breath (Asthma), difficulty blowing air out (Emphysema), coughing up mucus (Bronchitis), persistent dry cough, or temporary stoppage of breathing during sleep Neurological History: Abnormal skin sensations (Peripheral Neuropathy) Review of Past Neurological Studies: No results found for this or any previous visit. Psychological-Psychiatric History: No reported psychological or psychiatric signs or symptoms such as difficulty sleeping, anxiety, depression, delusions or hallucinations (schizophrenial), mood swings (bipolar disorders) or suicidal ideations or attempts Gastrointestinal History: No reported gastrointestinal signs or symptoms such as vomiting or evacuating blood, reflux, heartburn, alternating episodes of diarrhea and constipation, inflamed or scarred liver, or pancreas or irrregular and/or infrequent bowel movements Genitourinary History: No reported renal or genitourinary signs or symptoms such as difficulty voiding or producing urine, peeing blood, non-functioning kidney, kidney stones, difficulty emptying the bladder, difficulty controlling the flow of urine, or chronic kidney disease Hematological History: No reported hematological signs or symptoms such as prolonged bleeding, low or poor functioning platelets, bruising or bleeding easily, hereditary bleeding problems, low energy levels due to low hemoglobin or being anemic Endocrine History: High blood sugar controlled without the use of insulin (NIDDM) Rheumatologic History: No reported rheumatological signs and symptoms such as fatigue, joint pain, tenderness, swelling, redness, heat, stiffness, decreased range of motion, with or without associated rash Musculoskeletal History: Negative for myasthenia gravis, muscular dystrophy, multiple sclerosis or malignant hyperthermia Work History:  Disabled  Allergies  Mr. Caamano is allergic to bactrim [sulfamethoxazole-trimethoprim].  Laboratory Chemistry  Inflammation Markers Lab Results  Component Value Date   CRP 10.8 (H) 01/23/2018   ESRSEDRATE 55 (H) 01/23/2018   (CRP: Acute Phase) (ESR: Chronic Phase) Renal Function Markers Lab Results  Component Value Date   BUN 21 (H) 01/08/2018   CREATININE 1.31 (H) 01/08/2018   GFRAA >60 01/08/2018   GFRNONAA 59 (L) 01/08/2018   Hepatic Function Markers Lab Results  Component Value Date   AST 45 (H) 01/08/2018   ALT 22 01/08/2018   ALBUMIN 3.8 01/08/2018   ALKPHOS 99 01/08/2018   Electrolytes Lab Results  Component Value Date   NA 139 01/08/2018   K 3.4 (L) 01/08/2018   CL 102 01/08/2018   CALCIUM 9.0 01/08/2018   Neuropathy Markers No results found for: [id number] Bone Pathology Markers Lab Results  Component Value Date   ALKPHOS 99 01/08/2018   CALCIUM 9.0 01/08/2018   Coagulation Parameters Lab Results  Component Value Date   INR 1.05 01/08/2018   LABPROT 13.6 01/08/2018   PLT 273 01/08/2018   Cardiovascular Markers Lab Results  Component Value Date   HGB 14.3 01/08/2018   HCT 41.5 01/08/2018   Note: Lab results reviewed.  PFSH  Drug: Mr. Roback  reports that he has current or past drug history. Drugs: Marijuana and Cocaine. Alcohol:  reports that he drinks alcohol. Tobacco:  reports that he has never smoked. He has never used smokeless tobacco. Medical:  has a past medical history of Arthritis, Bacterial infection due to Morganella morganii (12/26/2017), BPH (benign prostatic hypertrophy), Diabetes mellitus, Diabetic neuropathy ([place]), Hypertension, Metatarsal bone fracture, Osteomyelitis of toe of right foot ([place]), Post-operative infection, Sepsis due to Streptococcus, group B ([place]) (12/26/2017), Sleep apnea, and Wears glasses. Family: family history includes Diabetes in his mother and other.  Past Surgical History:  Procedure Laterality Date  .  COLONOSCOPY W/ BIOPSIES AND POLYPECTOMY    . I&D EXTREMITY Left 01/11/2016   Procedure: LEFT FOOT IRRIGATION AND DEBRIDEMENT WOUND VAC AND REMOVAL OF HARDWARE;  Surgeon: Wylene Simmer, MD;  Location: [facility];  Service: Orthopedics;  Laterality: Left;  . LOWER EXTREMITY ANGIOGRAPHY N/A 12/01/2017   Procedure: Lower Extremity Angiography;  Surgeon: Algernon Huxley, MD;  Location: [facility];  Service: Cardiovascular;  Laterality: N/A;  . LOWER EXTREMITY ANGIOGRAPHY Right 12/04/2017   Procedure: Lower Extremity Angiography;  Surgeon: Algernon Huxley, MD;  Location: [facility];  Service: Cardiovascular;  Laterality: Right;  . ORIF TOE FRACTURE Left 06/08/2015   Procedure: OPEN REDUCTION INTERNAL FIXATION (ORIF) LEFT FIFTH METATARSAL BASE FRACTURE NONUNION; CALCANEAL AUTOGRAFT ;  Surgeon: Wylene Simmer, MD;  Location: [facility];  Service: Orthopedics;  Laterality: Left;  . PATELLA RECONSTRUCTION Left 2005  . PROSTATE ABLATION  2014  . TOE AMPUTATION     partial amputation right great toe  . WOUND DEBRIDEMENT Bilateral 11/30/2017   Procedure: DEBRIDEMENT WOUND;  Surgeon: Sharlotte Alamo, DPM;  Location: [facility];  Service: Podiatry;  Laterality: Bilateral;   Active Ambulatory Problems    Diagnosis Date Noted  . 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 Metatarsal stress fracture of left foot 01/12/2016  . Diabetes mellitus with diabetic polyneuropathy ([place]) 01/12/2016  . Diabetic foot infection ([place]) 01/12/2016  . Hardware complicating wound infection ([place])   . Diabetes mellitus due to underlying condition, uncontrolled, with diabetic neuropathy ([place])   . Surgery, elective   . Cellulitis of left foot 03/05/2017  . HTN (hypertension) 11/29/2017  . HLD (hyperlipidemia) 11/29/2017  . Osteomyelitis ([place]) 11/29/2017  . Bacterial infection due to Morganella morganii 12/26/2017  . Sepsis due to Streptococcus, group B  ([place]) 12/26/2017  . Bilateral foot pain (Primary Area of Pain) (L>R) 02/10/2018  . Chronic pain of both lower extremities (Secondary Area of Pain) (L>R) 02/10/2018  . Chronic pain syndrome 02/10/2018  . Long term current use of opiate analgesic 02/10/2018  . Pharmacologic therapy 02/10/2018  . Disorder of skeletal system 02/10/2018  . Problems influencing health status 02/10/2018   Resolved Ambulatory Problems    Diagnosis Date Noted  . No Resolved Ambulatory Problems   Past Medical History:  Diagnosis Date  . Arthritis   . BPH (benign prostatic hypertrophy)   . Diabetes mellitus   . Diabetic neuropathy ([place])   . Hypertension   . Metatarsal bone fracture   . Osteomyelitis of toe of right foot ([place])   . Post-operative infection   . Sleep apnea   . Wears glasses    Constitutional Exam  General appearance: Well nourished, well developed, and well hydrated. In no apparent acute distress Vitals:   02/10/18 1014  BP: (!) 149/93  Pulse: 94  Temp: 98 F (36.7 C)  SpO2: 98%  Weight: 242 lb (109.8 kg)  Height: '5\' 10"'$  (1.778 m)   BMI Assessment: Estimated body mass index is 34.72 kg/m as calculated from the following:   Height as of this encounter: '5\' 10"'$  (1.778 m).   Weight as of this encounter: 242 lb (109.8 kg).  BMI interpretation table: BMI level Category Range association with higher incidence of chronic pain  <18 kg/m2 Underweight   18.5-24.9 kg/m2 Ideal body weight   25-29.9 kg/m2 Overweight Increased incidence by 20%  30-34.9 kg/m2 Obese (  Class I) Increased incidence by 68%  35-39.9 kg/m2 Severe obesity (Class II) Increased incidence by 136%  >40 kg/m2 Extreme obesity (Class III) Increased incidence by 254%   BMI Readings from Last 4 Encounters:  02/10/18 34.72 kg/m  01/23/18 34.29 kg/m  01/08/18 34.29 kg/m  12/26/17 34.29 kg/m   Wt Readings from Last 4 Encounters:  02/10/18 242 lb (109.8 kg)  01/23/18 239 lb (108.4 kg)  01/08/18 239 lb (108.4 kg)   12/26/17 239 lb (108.4 kg)  Psych/Mental status: Alert, oriented x 3 (person, place, & time)       Eyes: PERLA Respiratory: No evidence of acute respiratory distress  Gait & Posture Assessment  Ambulation: Limited Gait: Antalgic Posture: WNL   Lower Extremity Exam    Side: Right lower extremity  Side: Left lower extremity  Inspection: Amputation of great toe and fifth . Discoloration to calf    Inspection: Left ankle foot boot warm  Functional ROM: Unrestricted ROM          Functional ROM: Unrestricted ROM          Muscle strength & Tone: Functionally intact  Muscle strength & Tone: Functionally intact  Sensory: Unimpaired  Sensory: Unimpaired  Palpation: No palpable anomalies  Palpation: No palpable anomalies   Assessment  Primary Diagnosis & Pertinent Problem List: Diagnoses of Bilateral foot pain (Primary Area of Pain) (L>R), Chronic pain of both lower extremities (Secondary Area of Pain) (L>R), Chronic pain syndrome, Long term current use of opiate analgesic, Pharmacologic therapy, Disorder of skeletal system, and Problems influencing health status were pertinent to this visit.  Visit Diagnosis: 1. Bilateral foot pain (Primary Area of Pain) (L>R)   2. Chronic pain of both lower extremities (Secondary Area of Pain) (L>R)   3. Chronic pain syndrome   4. Long term current use of opiate analgesic   5. Pharmacologic therapy   6. Disorder of skeletal system   7. Problems influencing health status    Plan of Care  Initial treatment plan:  Please be advised that as per protocol, today's visit has been an evaluation only. We have not taken over the patient's controlled substance management.  Problem-specific plan: No problem-specific Assessment & Plan notes found for this encounter.  Ordered Lab-work, Procedure(s), Referral(s), & Consult(s): Orders Placed This Encounter  Procedures  . Compliance Drug Analysis, Ur  . Comp. Metabolic Panel (12)  . Magnesium  . Vitamin B12  .  Sedimentation rate  . 25-Hydroxyvitamin D Lcms D2+D3  . C-reactive protein   Pharmacotherapy: Medications ordered:  No orders of the defined types were placed in this encounter.  Medications administered during this visit: Prestin Munch. Born[place] had no medications administered during this visit.   Pharmacotherapy under consideration:  Opioid Analgesics: The patient was informed that there is no guarantee that he would be a candidate for opioid analgesics. The decision will be made following CDC guidelines. This decision will be based on the results of diagnostic studies, as well as Mr. Broxson risk profile.  Membrane stabilizer: To be determined at a later time Muscle relaxant: To be determined at a later time NSAID: To be determined at a later time Other analgesic(s): To be determined at a later time   Interventional therapies under consideration: Mr. Stipp was informed that there is no guarantee that he would be a candidate for interventional therapies. The decision will be based on the results of diagnostic studies, as well as Mr. Priola risk profile.  Possible procedure(s):    Provider-requested  follow-up: Return for 2nd Visit, w/ Dr. Dossie Arbour, (ASAP).  Future Appointments  Date Time Provider [place]  02/13/2018  9:45 AM Lucky Cowboy, Erskine Squibb, MD [facility] None  03/03/2018 10:00 AM Golden Circle, FNP [facility]    Primary Care Physician: Lorelee Market, MD Location: [facility] Outpatient Pain Management Facility Note by:  Date: 02/10/2018; Time: 11:26 AM  Pain Score Disclaimer: We use the NRS-11 scale. This is a self-reported, subjective measurement of pain severity with only modest accuracy. It is used primarily to identify changes within a particular patient. It must be understood that outpatient pain scales are significantly less accurate that those used for research, where they can be applied under ideal controlled circumstances with minimal exposure to variables. In reality,  the score is likely to be a combination of pain intensity and pain affect, where pain affect describes the degree of emotional arousal or changes in action readiness caused by the sensory experience of pain. Factors such as social and work situation, setting, emotional state, anxiety levels, expectation, and prior pain experience may influence pain perception and show large inter-individual differences that may also be affected by time variables.  Patient instructions provided during this appointment: Patient Instructions   ____________________________________________________________________________________________  Appointment Policy Summary  It is our goal and responsibility to provide the medical community with assistance in the evaluation and management of patients with chronic pain. Unfortunately our resources are limited. Because we do not have an unlimited amount of time, or available appointments, we are required to closely monitor and manage their use. The following rules exist to maximize their use:  Patient's responsibilities: 1. Punctuality:  At what time should I arrive? You should be physically present in our office 30 minutes before your scheduled appointment. Your scheduled appointment is with your assigned healthcare provider. However, it takes 5-10 minutes to be "checked-in", and another 15 minutes for the nurses to do the admission. If you arrive to our office at the time you were given for your appointment, you will end up being at least 20-25 minutes late to your appointment with the provider. 2. Tardiness:  What happens if I arrive only a few minutes after my scheduled appointment time? You will need to reschedule your appointment. The cutoff is your appointment time. This is why it is so important that you arrive at least 30 minutes before that appointment. If you have an appointment scheduled for 10:00 AM and you arrive at 10:01, you will be required to reschedule your  appointment.  3. Plan ahead:  Always assume that you will encounter traffic on your way in. Plan for it. If you are dependent on a driver, make sure they understand these rules and the need to arrive early. 4. Other appointments and responsibilities:  Avoid scheduling any other appointments before or after your pain clinic appointments.  5. Be prepared:  Write down everything that you need to discuss with your healthcare provider and give this information to the admitting nurse. Write down the medications that you will need refilled. Bring your pills and bottles (even the empty ones), to all of your appointments, except for those where a procedure is scheduled. 6. No children or pets:  Find someone to take care of them. It is not appropriate to bring them in. 7. Scheduling changes:  We request "advanced notification" of any changes or cancellations. 8. Advanced notification:  Defined as a time period of more than 24 hours prior to the originally scheduled appointment. This allows for the appointment  to be offered to other patients. 9. Rescheduling:  When a visit is rescheduled, it will require the cancellation of the original appointment. For this reason they both fall within the category of "Cancellations".  10. Cancellations:  They require advanced notification. Any cancellation less than 24 hours before the  appointment will be recorded as a "No Show". 11. No Show:  Defined as an unkept appointment where the patient failed to notify or declare to the practice their intention or inability to keep the appointment.  Corrective process for repeat offenders:  1. Tardiness: Three (3) episodes of rescheduling due to late arrivals will be recorded as one (1) "No Show". 2. Cancellation or reschedule: Three (3) cancellations or rescheduling will be recorded as one (1) "No Show". 3. "No Shows": Three (3) "No Shows" within a 12 month period will result in discharge from the  practice. ____________________________________________________________________________________________  ____________________________________________________________________________________________  Pain Scale  Introduction: The pain score used by this practice is the Verbal Numerical Rating Scale (VNRS-11). This is an 11-point scale. It is for adults and children 10 years or older. There are significant differences in how the pain score is reported, used, and applied. Forget everything you learned in the past and learn this scoring system.  General Information: The scale should reflect your current level of pain. Unless you are specifically asked for the level of your worst pain, or your average pain. If you are asked for one of these two, then it should be understood that it is over the past 24 hours.  Basic Activities of Daily Living (ADL): Personal hygiene, dressing, eating, transferring, and using restroom.  Instructions: Most patients tend to report their level of pain as a combination of two factors, their physical pain and their psychosocial pain. This last one is also known as "suffering" and it is reflection of how physical pain affects you socially and psychologically. From now on, report them separately. From this point on, when asked to report your pain level, report only your physical pain. Use the following table for reference.  Pain Clinic Pain Levels (0-5/10)  Pain Level Score  Description  No Pain 0   Mild pain 1 Nagging, annoying, but does not interfere with basic activities of daily living (ADL). Patients are able to eat, bathe, get dressed, toileting (being able to get on and off the toilet and perform personal hygiene functions), transfer (move in and out of bed or a chair without assistance), and maintain continence (able to control bladder and bowel functions). Blood pressure and heart rate are unaffected. A normal heart rate for a healthy adult ranges from 60 to 100 bpm  (beats per minute).   Mild to moderate pain 2 Noticeable and distracting. Impossible to hide from other people. More frequent flare-ups. Still possible to adapt and function close to normal. It can be very annoying and may have occasional stronger flare-ups. With discipline, patients may get used to it and adapt.   Moderate pain 3 Interferes significantly with activities of daily living (ADL). It becomes difficult to feed, bathe, get dressed, get on and off the toilet or to perform personal hygiene functions. Difficult to get in and out of bed or a chair without assistance. Very distracting. With effort, it can be ignored when deeply involved in activities.   Moderately severe pain 4 Impossible to ignore for more than a few minutes. With effort, patients may still be able to manage work or participate in some social activities. Very difficult to concentrate. Signs of  autonomic nervous system discharge are evident: dilated pupils (mydriasis); mild sweating (diaphoresis); sleep interference. Heart rate becomes elevated (>115 bpm). Diastolic blood pressure (lower number) rises above 100 mmHg. Patients find relief in laying down and not moving.   Severe pain 5 Intense and extremely unpleasant. Associated with frowning face and frequent crying. Pain overwhelms the senses.  Ability to do any activity or maintain social relationships becomes significantly limited. Conversation becomes difficult. Pacing back and forth is common, as getting into a comfortable position is nearly impossible. Pain wakes you up from deep sleep. Physical signs will be obvious: pupillary dilation; increased sweating; goosebumps; brisk reflexes; cold, clammy hands and feet; nausea, vomiting or dry heaves; loss of appetite; significant sleep disturbance with inability to fall asleep or to remain asleep. When persistent, significant weight loss is observed due to the complete loss of appetite and sleep deprivation.  Blood pressure and heart  rate becomes significantly elevated. Caution: If elevated blood pressure triggers a pounding headache associated with blurred vision, then the patient should immediately seek attention at an urgent or emergency care unit, as these may be signs of an impending stroke.    Emergency Department Pain Levels (6-10/10)  Emergency Room Pain 6 Severely limiting. Requires emergency care and should not be seen or managed at an outpatient pain management facility. Communication becomes difficult and requires great effort. Assistance to reach the emergency department may be required. Facial flushing and profuse sweating along with potentially dangerous increases in heart rate and blood pressure will be evident.   Distressing pain 7 Self-care is very difficult. Assistance is required to transport, or use restroom. Assistance to reach the emergency department will be required. Tasks requiring coordination, such as bathing and getting dressed become very difficult.   Disabling pain 8 Self-care is no longer possible. At this level, pain is disabling. The individual is unable to do even the most "basic" activities such as walking, eating, bathing, dressing, transferring to a bed, or toileting. Fine motor skills are lost. It is difficult to think clearly.   Incapacitating pain 9 Pain becomes incapacitating. Thought processing is no longer possible. Difficult to remember your own name. Control of movement and coordination are lost.   The worst pain imaginable 10 At this level, most patients pass out from pain. When this level is reached, collapse of the autonomic nervous system occurs, leading to a sudden drop in blood pressure and heart rate. This in turn results in a temporary and dramatic drop in blood flow to the brain, leading to a loss of consciousness. Fainting is one of the body's self defense mechanisms. Passing out puts the brain in a calmed state and causes it to shut down for a while, in order to begin the  healing process.    Summary: 1. Refer to this scale when providing [place] with your pain level. 2. Be accurate and careful when reporting your pain level. This will help with your care. 3. Over-reporting your pain level will lead to loss of credibility. 4. Even a level of 1/10 means that there is pain and will be treated at our facility. 5. High, inaccurate reporting will be documented as "Symptom Exaggeration", leading to loss of credibility and suspicions of possible secondary gains such as obtaining more narcotics, or wanting to appear disabled, for fraudulent reasons. 6. Only pain levels of 5 or below will be seen at our facility. 7. Pain levels of 6 and above will be sent to the Emergency Department and the appointment cancelled.  ____________________________________________________________________________________________    BMI Assessment: Estimated body mass index is 34.72 kg/m as calculated from the following:   Height as of this encounter: '5\' 10"'$  (1.778 m).   Weight as of this encounter: 242 lb (109.8 kg).  BMI interpretation table: BMI level Category Range association with higher incidence of chronic pain  <18 kg/m2 Underweight   18.5-24.9 kg/m2 Ideal body weight   25-29.9 kg/m2 Overweight Increased incidence by 20%  30-34.9 kg/m2 Obese (Class I) Increased incidence by 68%  35-39.9 kg/m2 Severe obesity (Class II) Increased incidence by 136%  >40 kg/m2 Extreme obesity (Class III) Increased incidence by 254%   BMI Readings from Last 4 Encounters:  02/10/18 34.72 kg/m  01/23/18 34.29 kg/m  01/08/18 34.29 kg/m  12/26/17 34.29 kg/m   Wt Readings from Last 4 Encounters:  02/10/18 242 lb (109.8 kg)  01/23/18 239 lb (108.4 kg)  01/08/18 239 lb (108.4 kg)  12/26/17 239 lb (108.4 kg)

## 2018-02-10 NOTE — Patient Instructions (Addendum)
____________________________________________________________________________________________  Appointment Policy Summary  It is our goal and responsibility to provide the medical community with assistance in the evaluation and management of patients with chronic pain. Unfortunately our resources are limited. Because we do not have an unlimited amount of time, or available appointments, we are required to closely monitor and manage their use. The following rules exist to maximize their use:  Patient's responsibilities: 1. Punctuality:  At what time should I arrive? You should be physically present in our office 30 minutes before your scheduled appointment. Your scheduled appointment is with your assigned healthcare provider. However, it takes 5-10 minutes to be "checked-in", and another 15 minutes for the nurses to do the admission. If you arrive to our office at the time you were given for your appointment, you will end up being at least 20-25 minutes late to your appointment with the provider. 2. Tardiness:  What happens if I arrive only a few minutes after my scheduled appointment time? You will need to reschedule your appointment. The cutoff is your appointment time. This is why it is so important that you arrive at least 30 minutes before that appointment. If you have an appointment scheduled for 10:00 AM and you arrive at 10:01, you will be required to reschedule your appointment.  3. Plan ahead:  Always assume that you will encounter traffic on your way in. Plan for it. If you are dependent on a driver, make sure they understand these rules and the need to arrive early. 4. Other appointments and responsibilities:  Avoid scheduling any other appointments before or after your pain clinic appointments.  5. Be prepared:  Write down everything that you need to discuss with your healthcare provider and give this information to the admitting nurse. Write down the medications that you will need  refilled. Bring your pills and bottles (even the empty ones), to all of your appointments, except for those where a procedure is scheduled. 6. No children or pets:  Find someone to take care of them. It is not appropriate to bring them in. 7. Scheduling changes:  We request "advanced notification" of any changes or cancellations. 8. Advanced notification:  Defined as a time period of more than 24 hours prior to the originally scheduled appointment. This allows for the appointment to be offered to other patients. 9. Rescheduling:  When a visit is rescheduled, it will require the cancellation of the original appointment. For this reason they both fall within the category of "Cancellations".  10. Cancellations:  They require advanced notification. Any cancellation less than 24 hours before the  appointment will be recorded as a "No Show". 11. No Show:  Defined as an unkept appointment where the patient failed to notify or declare to the practice their intention or inability to keep the appointment.  Corrective process for repeat offenders:  1. Tardiness: Three (3) episodes of rescheduling due to late arrivals will be recorded as one (1) "No Show". 2. Cancellation or reschedule: Three (3) cancellations or rescheduling will be recorded as one (1) "No Show". 3. "No Shows": Three (3) "No Shows" within a 12 month period will result in discharge from the practice. ____________________________________________________________________________________________  ____________________________________________________________________________________________  Pain Scale  Introduction: The pain score used by this practice is the Verbal Numerical Rating Scale (VNRS-11). This is an 11-point scale. It is for adults and children 10 years or older. There are significant differences in how the pain score is reported, used, and applied. Forget everything you learned in the past and learn  this scoring system.  General  Information: The scale should reflect your current level of pain. Unless you are specifically asked for the level of your worst pain, or your average pain. If you are asked for one of these two, then it should be understood that it is over the past 24 hours.  Basic Activities of Daily Living (ADL): Personal hygiene, dressing, eating, transferring, and using restroom.  Instructions: Most patients tend to report their level of pain as a combination of two factors, their physical pain and their psychosocial pain. This last one is also known as "suffering" and it is reflection of how physical pain affects you socially and psychologically. From now on, report them separately. From this point on, when asked to report your pain level, report only your physical pain. Use the following table for reference.  Pain Clinic Pain Levels (0-5/10)  Pain Level Score  Description  No Pain 0   Mild pain 1 Nagging, annoying, but does not interfere with basic activities of daily living (ADL). Patients are able to eat, bathe, get dressed, toileting (being able to get on and off the toilet and perform personal hygiene functions), transfer (move in and out of bed or a chair without assistance), and maintain continence (able to control bladder and bowel functions). Blood pressure and heart rate are unaffected. A normal heart rate for a healthy adult ranges from 60 to 100 bpm (beats per minute).   Mild to moderate pain 2 Noticeable and distracting. Impossible to hide from other people. More frequent flare-ups. Still possible to adapt and function close to normal. It can be very annoying and may have occasional stronger flare-ups. With discipline, patients may get used to it and adapt.   Moderate pain 3 Interferes significantly with activities of daily living (ADL). It becomes difficult to feed, bathe, get dressed, get on and off the toilet or to perform personal hygiene functions. Difficult to get in and out of bed or a chair  without assistance. Very distracting. With effort, it can be ignored when deeply involved in activities.   Moderately severe pain 4 Impossible to ignore for more than a few minutes. With effort, patients may still be able to manage work or participate in some social activities. Very difficult to concentrate. Signs of autonomic nervous system discharge are evident: dilated pupils (mydriasis); mild sweating (diaphoresis); sleep interference. Heart rate becomes elevated (>115 bpm). Diastolic blood pressure (lower number) rises above 100 mmHg. Patients find relief in laying down and not moving.   Severe pain 5 Intense and extremely unpleasant. Associated with frowning face and frequent crying. Pain overwhelms the senses.  Ability to do any activity or maintain social relationships becomes significantly limited. Conversation becomes difficult. Pacing back and forth is common, as getting into a comfortable position is nearly impossible. Pain wakes you up from deep sleep. Physical signs will be obvious: pupillary dilation; increased sweating; goosebumps; brisk reflexes; cold, clammy hands and feet; nausea, vomiting or dry heaves; loss of appetite; significant sleep disturbance with inability to fall asleep or to remain asleep. When persistent, significant weight loss is observed due to the complete loss of appetite and sleep deprivation.  Blood pressure and heart rate becomes significantly elevated. Caution: If elevated blood pressure triggers a pounding headache associated with blurred vision, then the patient should immediately seek attention at an urgent or emergency care unit, as these may be signs of an impending stroke.    Emergency Department Pain Levels (6-10/10)  Emergency Room Pain 6 Severely   limiting. Requires emergency care and should not be seen or managed at an outpatient pain management facility. Communication becomes difficult and requires great effort. Assistance to reach the emergency department  may be required. Facial flushing and profuse sweating along with potentially dangerous increases in heart rate and blood pressure will be evident.   Distressing pain 7 Self-care is very difficult. Assistance is required to transport, or use restroom. Assistance to reach the emergency department will be required. Tasks requiring coordination, such as bathing and getting dressed become very difficult.   Disabling pain 8 Self-care is no longer possible. At this level, pain is disabling. The individual is unable to do even the most "basic" activities such as walking, eating, bathing, dressing, transferring to a bed, or toileting. Fine motor skills are lost. It is difficult to think clearly.   Incapacitating pain 9 Pain becomes incapacitating. Thought processing is no longer possible. Difficult to remember your own name. Control of movement and coordination are lost.   The worst pain imaginable 10 At this level, most patients pass out from pain. When this level is reached, collapse of the autonomic nervous system occurs, leading to a sudden drop in blood pressure and heart rate. This in turn results in a temporary and dramatic drop in blood flow to the brain, leading to a loss of consciousness. Fainting is one of the body's self defense mechanisms. Passing out puts the brain in a calmed state and causes it to shut down for a while, in order to begin the healing process.    Summary: 1. Refer to this scale when providing [place] with your pain level. 2. Be accurate and careful when reporting your pain level. This will help with your care. 3. Over-reporting your pain level will lead to loss of credibility. 4. Even a level of 1/10 means that there is pain and will be treated at our facility. 5. High, inaccurate reporting will be documented as "Symptom Exaggeration", leading to loss of credibility and suspicions of possible secondary gains such as obtaining more narcotics, or wanting to appear disabled, for  fraudulent reasons. 6. Only pain levels of 5 or below will be seen at our facility. 7. Pain levels of 6 and above will be sent to the Emergency Department and the appointment cancelled. ____________________________________________________________________________________________    BMI Assessment: Estimated body mass index is 34.72 kg/m as calculated from the following:   Height as of this encounter: 5\' 10"  (1.778 m).   Weight as of this encounter: 242 lb (109.8 kg).  BMI interpretation table: BMI level Category Range association with higher incidence of chronic pain  <18 kg/m2 Underweight   18.5-24.9 kg/m2 Ideal body weight   25-29.9 kg/m2 Overweight Increased incidence by 20%  30-34.9 kg/m2 Obese (Class I) Increased incidence by 68%  35-39.9 kg/m2 Severe obesity (Class II) Increased incidence by 136%  >40 kg/m2 Extreme obesity (Class III) Increased incidence by 254%   BMI Readings from Last 4 Encounters:  02/10/18 34.72 kg/m  01/23/18 34.29 kg/m  01/08/18 34.29 kg/m  12/26/17 34.29 kg/m   Wt Readings from Last 4 Encounters:  02/10/18 242 lb (109.8 kg)  01/23/18 239 lb (108.4 kg)  01/08/18 239 lb (108.4 kg)  12/26/17 239 lb (108.4 kg)

## 2018-02-13 ENCOUNTER — Encounter (INDEPENDENT_AMBULATORY_CARE_PROVIDER_SITE_OTHER): Payer: BLUE CROSS/BLUE SHIELD | Admitting: Vascular Surgery

## 2018-02-15 LAB — COMP. METABOLIC PANEL (12)
A/G RATIO: 1.3 (ref 1.2–2.2)
AST: 11 IU/L (ref 0–40)
Albumin: 4.1 g/dL (ref 3.5–5.5)
Alkaline Phosphatase: 110 IU/L (ref 39–117)
BILIRUBIN TOTAL: 0.4 mg/dL (ref 0.0–1.2)
BUN/Creatinine Ratio: 14 (ref 9–20)
BUN: 13 mg/dL (ref 6–24)
CALCIUM: 9.7 mg/dL (ref 8.7–10.2)
CHLORIDE: 99 mmol/L (ref 96–106)
Creatinine, Ser: 0.96 mg/dL (ref 0.76–1.27)
GFR calc non Af Amer: 88 mL/min/{1.73_m2} (ref >59)
GFR, EST AFRICAN AMERICAN: 102 mL/min/{1.73_m2} (ref >59)
GLOBULIN, TOTAL: 3.2 g/dL (ref 1.5–4.5)
Glucose: 142 mg/dL — ABNORMAL HIGH (ref 65–99)
Potassium: 4.6 mmol/L (ref 3.5–5.2)
SODIUM: 139 mmol/L (ref 134–144)
TOTAL PROTEIN: 7.3 g/dL (ref 6.0–8.5)

## 2018-02-15 LAB — MAGNESIUM: MAGNESIUM: 2.1 mg/dL (ref 1.6–2.3)

## 2018-02-15 LAB — 25-HYDROXY VITAMIN D LCMS D2+D3
25-Hydroxy, Vitamin D-2: <1 ng/mL
25-Hydroxy, Vitamin D-3: 25 ng/mL
25-Hydroxy, Vitamin D: 25 ng/mL — ABNORMAL LOW

## 2018-02-15 LAB — VITAMIN B12: Vitamin B-12: 465 pg/mL (ref 232–1245)

## 2018-02-15 LAB — SEDIMENTATION RATE: Sed Rate: 51 mm/hr — ABNORMAL HIGH (ref 0–30)

## 2018-02-15 LAB — C-REACTIVE PROTEIN: CRP: 6 mg/L (ref 0–10)

## 2018-02-16 LAB — COMPLIANCE DRUG ANALYSIS, UR

## 2018-02-24 ENCOUNTER — Other Ambulatory Visit (INDEPENDENT_AMBULATORY_CARE_PROVIDER_SITE_OTHER): Payer: Self-pay | Admitting: Nurse Practitioner

## 2018-02-24 ENCOUNTER — Encounter (INDEPENDENT_AMBULATORY_CARE_PROVIDER_SITE_OTHER): Payer: Self-pay

## 2018-02-24 ENCOUNTER — Ambulatory Visit (INDEPENDENT_AMBULATORY_CARE_PROVIDER_SITE_OTHER): Payer: BLUE CROSS/BLUE SHIELD | Admitting: Vascular Surgery

## 2018-02-24 ENCOUNTER — Encounter (INDEPENDENT_AMBULATORY_CARE_PROVIDER_SITE_OTHER): Payer: Self-pay | Admitting: Vascular Surgery

## 2018-02-24 VITALS — BP 169/95 | HR 92 | Resp 16 | Ht 70.0 in | Wt 248.6 lb

## 2018-02-24 DIAGNOSIS — I10 Essential (primary) hypertension: Secondary | ICD-10-CM

## 2018-02-24 DIAGNOSIS — E0840 Diabetes mellitus due to underlying condition with diabetic neuropathy, unspecified: Secondary | ICD-10-CM | POA: Diagnosis not present

## 2018-02-24 DIAGNOSIS — G4730 Sleep apnea, unspecified: Secondary | ICD-10-CM | POA: Insufficient documentation

## 2018-02-24 DIAGNOSIS — E785 Hyperlipidemia, unspecified: Secondary | ICD-10-CM

## 2018-02-24 DIAGNOSIS — I7025 Atherosclerosis of native arteries of other extremities with ulceration: Secondary | ICD-10-CM

## 2018-02-24 DIAGNOSIS — M792 Neuralgia and neuritis, unspecified: Secondary | ICD-10-CM | POA: Insufficient documentation

## 2018-02-24 DIAGNOSIS — IMO0002 Reserved for concepts with insufficient information to code with codable children: Secondary | ICD-10-CM

## 2018-02-24 DIAGNOSIS — E559 Vitamin D deficiency, unspecified: Secondary | ICD-10-CM | POA: Insufficient documentation

## 2018-02-24 DIAGNOSIS — E1142 Type 2 diabetes mellitus with diabetic polyneuropathy: Secondary | ICD-10-CM | POA: Insufficient documentation

## 2018-02-24 DIAGNOSIS — E0865 Diabetes mellitus due to underlying condition with hyperglycemia: Secondary | ICD-10-CM

## 2018-02-24 NOTE — Assessment & Plan Note (Signed)
blood glucose control important in reducing the progression of atherosclerotic disease. Also, involved in wound healing. On appropriate medications.  

## 2018-02-24 NOTE — Patient Instructions (Signed)

## 2018-02-24 NOTE — Progress Notes (Deleted)
Patient's Name: [name]  MRN: [id number]  Referring Provider: Lorelee Market, MD  DOB: [birth year]  PCP: Lorelee Market, MD  DOS: 02/25/2018  Note by: Gaspar Cola, MD  Service setting: Ambulatory outpatient  Specialty: Interventional Pain Management  Location: [facility] (AMB) Pain Management Facility    Patient type: Established   Primary Reason(s) for Visit: Encounter for evaluation before starting new chronic pain management plan of care (Level of risk: moderate) CC: No chief complaint on file.  HPI  [name] is a 57 y.o. year old, male patient, who comes today for a follow-up evaluation to review the test results and decide on a treatment plan. 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, with necrosis of muscle ([place]); Metatarsal stress fracture of left foot; Diabetes mellitus with diabetic polyneuropathy ([place]); Diabetic foot infection ([place]); Hardware complicating wound infection ([place]); Diabetes mellitus due to underlying condition, uncontrolled, with diabetic neuropathy ([place]); Surgery, elective; Cellulitis of left foot; HTN (hypertension); HLD (hyperlipidemia); Osteomyelitis ([place]); Bacterial infection due to Morganella morganii; Sepsis due to Streptococcus, group B ([place]); Chronic foot pain (Primary Area of Pain) (Bilateral) (L>R); Chronic lower extremity pain (Secondary Area of Pain) (Bilateral) (L>R); Chronic pain syndrome; Long term current use of opiate analgesic; Pharmacologic therapy; Disorder of skeletal system; Problems influencing health status; Atherosclerosis of native arteries of the extremities with ulceration ([place]); Sleep apnea; Diabetic polyneuropathy ([place]); Diabetic peripheral neuropathy ([place]); Vitamin D insufficiency; and Neurogenic pain on their problem list. His primarily concern today is the No chief complaint on file.  Pain Assessment: Location:     Radiating:   Onset:   Duration:   Quality:   Severity:  /10 (subjective,  self-reported pain score)  Note: Reported level is compatible with observation.                         When using our objective Pain Scale, levels between 6 and 10/10 are said to belong in an emergency room, as it progressively worsens from a 6/10, described as severely limiting, requiring emergency care not usually available at an outpatient pain management facility. At a 6/10 level, communication becomes difficult and requires great effort. Assistance to reach the emergency department may be required. Facial flushing and profuse sweating along with potentially dangerous increases in heart rate and blood pressure will be evident. Effect on ADL:   Timing:   Modifying factors:   BP:    HR:    [name] comes in today for a follow-up visit after his initial evaluation on 02/10/2018. Today we went over the results of his tests. These were explained in "Layman's terms". During today's appointment we went over my diagnostic impression, as well as the proposed treatment plan.  According to the patient his primary area of pain is in his feet.  He admits the left greater than the right.  He admits that he does have burning and numbness and tingling. He admits that he fractured his left foot approximately 3 years ago and has had increased complications since then.  He is status post several foot surgeries with Dr. Caryl Comes.  He has had osteomyelitis.  He is status post amputation of fifth toe on his left foot and amputation of right foot great toe and fifth toe . He does have diabetes and neuropathy.  He is currently being seen at the wound clinic for decreased healing.  He has had recent images.  In considering the treatment plan options,  [name] was reminded that I no longer take patients for medication management only. I asked him to let me know if he had no intention of taking advantage of the interventional therapies, so that we could make arrangements to provide this space to someone interested. I also made  it clear that undergoing interventional therapies for the purpose of getting pain medications is very inappropriate on the part of a patient, and it will not be tolerated in this practice. This type of behavior would suggest true addiction and therefore it requires referral to an addiction specialist.   Further details on both, my assessment(s), as well as the proposed treatment plan, please see below.  Controlled Substance Pharmacotherapy Assessment REMS (Risk Evaluation and Mitigation Strategy)  Analgesic: none Highest recorded MME/day: 75 mg/day MME/day: 0 mg/day Pill Count: None expected due to no prior prescriptions written by our practice. No notes on file Pharmacokinetics: Liberation and absorption (onset of action): WNL Distribution (time to peak effect): WNL Metabolism and excretion (duration of action): WNL         Pharmacodynamics: Desired effects: Analgesia: Mr. Bagnell reports >50% benefit. Functional ability: Patient reports that medication allows him to accomplish basic ADLs Clinically meaningful improvement in function (CMIF): Sustained CMIF goals met Perceived effectiveness: Described as relatively effective, allowing for increase in activities of daily living (ADL) Undesirable effects: Side-effects or Adverse reactions: None reported Monitoring: [place] PMP: Online review of the past 30-monthperiod previously conducted. Not applicable at this point since we have not taken over the patient's medication management yet. List of other Serum/Urine Drug Screening Test(s):  No results found. List of all UDS test(s) done:  Lab Results  Component Value Date   SUMMARY FINAL 02/10/2018   Last UDS on record: Summary  Date Value Ref Range Status  02/10/2018 FINAL  Final    Comment:    ==================================================================== TOXASSURE COMP DRUG ANALYSIS,UR ==================================================================== Test                              Result       Flag       Units Drug Present not Declared for Prescription Verification   Alcohol, Ethyl                 0.281        UNEXPECTED g/dL    Sources of ethyl alcohol include alcoholic beverages or as a    fermentation product of glucose; glucose is present in this    specimen.  Interpret result with caution, as the presence of    ethyl alcohol is likely due, at least in part, to fermentation of    glucose. Drug Absent but Declared for Prescription Verification   Salicylate                     Not Detected UNEXPECTED    Aspirin, as indicated in the declared medication list, is not    always detected even when used as directed. ==================================================================== Test                      Result    Flag   Units      Ref Range   Creatinine              99               mg/dL      >=20 ==================================================================== Declared Medications:  The flagging and interpretation on this report are based on the  following declared medications.  Unexpected results may arise from  inaccuracies in the declared medications.  **Note: The testing scope of this panel does not include small to  moderate amounts of these reported medications:  Aspirin (Aspirin 81)  **Note: The testing scope of this panel does not include following  reported medications:  Dapagliflozin Wilder Glade)  Insulin (Humalog)  Levofloxacin (Levaquin)  Linagliptin (Tradjenta)  Losartan (Cozaar)  Metformin ==================================================================== For clinical consultation, please call [phone]. ====================================================================    UDS interpretation: No unexpected findings.          Medication Assessment Form: Patient introduced to form today Treatment compliance: Treatment may start today if patient agrees with proposed plan. Evaluation of compliance is not applicable at this  point Risk Assessment Profile: Aberrant behavior: See initial evaluations. None observed or detected today Comorbid factors increasing risk of overdose: See initial evaluation. No additional risks detected today Opioid risk tool (ORT) (Total Score):   Personal History of Substance Abuse (SUD-Substance use disorder):  Alcohol:    Illegal Drugs:    Rx Drugs:    ORT Risk Level calculation:   Risk of substance use disorder (SUD): Low  ORT Scoring interpretation table:  Score <3 = Low Risk for SUD  Score between 4-7 = Moderate Risk for SUD  Score >8 = High Risk for Opioid Abuse   Risk Mitigation Strategies:  Patient opioid safety counseling: Completed today. Counseling provided to patient as per "Patient Counseling Document". Document signed by patient, attesting to counseling and understanding Patient-Prescriber Agreement (PPA): Obtained today.  Controlled substance notification to other providers: Written and sent today.  Pharmacologic Plan: Today we may be taking over the patient's pharmacological regimen. See below.             Laboratory Chemistry  Inflammation Markers (CRP: Acute Phase) (ESR: Chronic Phase) Lab Results  Component Value Date   CRP 6 02/10/2018   ESRSEDRATE 51 (H) 02/10/2018   LATICACIDVEN 1.3 01/08/2018                         Rheumatology Markers No results found.  Renal Function Markers Lab Results  Component Value Date   BUN 13 02/10/2018   CREATININE 0.96 02/10/2018   BCR 14 02/10/2018   GFRAA 102 02/10/2018   GFRNONAA 88 02/10/2018                             Hepatic Function Markers Lab Results  Component Value Date   AST 11 02/10/2018   ALT 22 01/08/2018   ALBUMIN 4.1 02/10/2018   ALKPHOS 110 02/10/2018                        Electrolytes Lab Results  Component Value Date   NA 139 02/10/2018   K 4.6 02/10/2018   CL 99 02/10/2018   CALCIUM 9.7 02/10/2018   MG 2.1 02/10/2018                        Neuropathy Markers Lab Results   Component Value Date   VITAMINB12 465 02/10/2018   HGBA1C 8.8 (H) 11/29/2017   HIV Non Reactive 03/06/2017                        Bone Pathology Markers Lab Results  Component Value Date   25OHVITD1 25 (L) 02/10/2018   25OHVITD2 <1.0 02/10/2018   25OHVITD3 25 02/10/2018                         Coagulation Parameters Lab Results  Component Value Date   INR 1.05 01/08/2018   LABPROT 13.6 01/08/2018   PLT 273 01/08/2018                        Cardiovascular Markers Lab Results  Component Value Date   CKTOTAL 129 11/09/2011   CKMB 0.6 11/09/2011   TROPONINI < 0.02 11/09/2011   HGB 14.3 01/08/2018   HCT 41.5 01/08/2018                         CA Markers No results found.  Note: Lab results reviewed.  Recent Diagnostic Imaging Review  Lumbosacral Imaging: Lumbar DG (Complete) 4+V:  Results for orders placed during the hospital encounter of 05/23/15  DG Lumbar Spine Complete   Narrative CLINICAL DATA:  Progressive lumbago without radicular symptoms.  EXAM: LUMBAR SPINE - COMPLETE 4+ VIEW  COMPARISON:  None.  FINDINGS: Frontal, lateral, spot lumbosacral lateral, and bilateral oblique views were obtained. There are 5 non-rib-bearing lumbar type vertebral bodies. The S1 vertebra is transitional with assimilation joints bilaterally at S1-2 and a disc space at S1-2 noted. There is no fracture or spondylolisthesis. Disc spaces appear intact. There is no appreciable facet arthropathy. There are foci of arterial vascular calcification in the pelvis.  IMPRESSION: No fracture or spondylolisthesis. No appreciable arthropathic change. Note transitional S1 vertebra. Areas of atherosclerotic vascular calcification in the pelvis.   Electronically Signed   By: Lowella Grip III M.D.   On: 05/23/2015 11:42    Foot Imaging: Foot-R DG Complete:  Results for orders placed during the hospital encounter of 06/26/15  DG Foot Complete Right   Narrative CLINICAL DATA:   The right foot ulcer at the base of great toe  EXAM: RIGHT FOOT COMPLETE - 3+ VIEW  COMPARISON:  10/26/2012  FINDINGS: Three views of the right foot submitted. No acute fracture or subluxation. The patient is status post amputation of distal phalanx right great toe. Mild soft tissue swelling plantar aspect at the level of first metatarsal phalangeal joint. There is soft tissue defect at this probable wound noted on lateral view. There is no evidence of bony erosion or bone destruction to suggest osteomyelitis.  IMPRESSION: No acute fracture or subluxation. Probable tissue wound plantar aspect in the region of first metatarsal phalangeal joint. No evidence of osteomyelitis. Status post amputation of distal phalanx great toe.   Electronically Signed   By: Lahoma Crocker M.D.   On: 06/26/2015 08:17    Foot-L DG Complete:  Results for orders placed during the hospital encounter of 11/28/17  DG Foot Complete Left   Narrative CLINICAL DATA:  Diabetic foot ulcer  EXAM: LEFT FOOT - COMPLETE 3+ VIEW  COMPARISON:  None.  FINDINGS: No fracture or dislocation is seen.  The joint spaces are preserved.  Mild cortical thickening/irregularity involving the base of the 5th metatarsal, chronic. No definite erosions.  Overlying soft tissue swelling/ulceration.  IMPRESSION: Soft tissue swelling/ulceration along the lateral base of the 5th metatarsal.  No definite radiographic findings to suggest acute osteomyelitis.   Electronically Signed   By: Julian Hy M.D.   On: 11/28/2017 21:56    Complexity Note: Imaging  results reviewed. Results shared with Mr. Ottaway, using Layman's terms.                         Meds   Current Outpatient Medications:  .  aspirin EC 81 MG tablet, Take 81 mg by mouth daily., Disp: , Rfl:  .  FARXIGA 10 MG TABS tablet, Take 1 tablet by mouth daily., Disp: , Rfl:  .  HUMALOG KWIKPEN 100 UNIT/ML KiwkPen, Inject 0-15 Units into the skin 3 (three)  times daily. MAX-45 units a day, Disp: , Rfl:  .  Insulin Glargine (BASAGLAR KWIKPEN) 100 UNIT/ML SOPN, Inject 0.35 mLs (35 Units total) into the skin at bedtime., Disp: 30 pen, Rfl: 3 .  levofloxacin (LEVAQUIN) 500 MG tablet, Take 1 tablet (500 mg total) by mouth daily., Disp: 30 tablet, Rfl: 0 .  losartan (COZAAR) 50 MG tablet, Take 50 mg by mouth daily., Disp: , Rfl:  .  metFORMIN (GLUCOPHAGE) 1000 MG tablet, Take 1,000 mg by mouth 2 (two) times daily., Disp: , Rfl:  .  TRADJENTA 5 MG TABS tablet, Take 1 tablet by mouth daily., Disp: , Rfl:  No current facility-administered medications for this visit.   Facility-Administered Medications Ordered in Other Visits:  .  Tdap (BOOSTRIX) injection 0.5 mL, 0.5 mL, Intramuscular, Once, Betancourt, Tina A, NP  ROS  Constitutional: Denies any fever or chills Gastrointestinal: No reported hemesis, hematochezia, vomiting, or acute GI distress Musculoskeletal: Denies any acute onset joint swelling, redness, loss of ROM, or weakness Neurological: No reported episodes of acute onset apraxia, aphasia, dysarthria, agnosia, amnesia, paralysis, loss of coordination, or loss of consciousness  Allergies  Mr. Tiso is allergic to bactrim [sulfamethoxazole-trimethoprim].  PFSH  Drug: Mr. Holzer  reports that he has current or past drug history. Drugs: Marijuana and Cocaine. Alcohol:  reports that he drinks alcohol. Tobacco:  reports that he has never smoked. He has never used smokeless tobacco. Medical:  has a past medical history of Arthritis, Bacterial infection due to Morganella morganii (12/26/2017), BPH (benign prostatic hypertrophy), Diabetes mellitus, Diabetic neuropathy ([place]), Hypertension, Metatarsal bone fracture, Osteomyelitis of toe of right foot ([place]), Post-operative infection, Sepsis due to Streptococcus, group B ([place]) (12/26/2017), Sleep apnea, and Wears glasses. Surgical: Mr. Thrun  has a past surgical history that includes Patella reconstruction  (Left, 2005); Prostate ablation (2014); Toe amputation; ORIF toe fracture (Left, 06/08/2015); Colonoscopy w/ biopsies and polypectomy; I&D extremity (Left, 01/11/2016); Wound debridement (Bilateral, 11/30/2017); Lower Extremity Angiography (N/A, 12/01/2017); and Lower Extremity Angiography (Right, 12/04/2017). Family: family history includes Diabetes in his mother and other.  Constitutional Exam  General appearance: Well nourished, well developed, and well hydrated. In no apparent acute distress There were no vitals filed for this visit. BMI Assessment: Estimated body mass index is 35.67 kg/m as calculated from the following:   Height as of 02/24/18: '5\' 10"'$  (1.778 m).   Weight as of 02/24/18: 248 lb 9.6 oz (112.8 kg).  BMI interpretation table: BMI level Category Range association with higher incidence of chronic pain  <18 kg/m2 Underweight   18.5-24.9 kg/m2 Ideal body weight   25-29.9 kg/m2 Overweight Increased incidence by 20%  30-34.9 kg/m2 Obese (Class I) Increased incidence by 68%  35-39.9 kg/m2 Severe obesity (Class II) Increased incidence by 136%  >40 kg/m2 Extreme obesity (Class III) Increased incidence by 254%   Patient's current BMI Ideal Body weight  There is no height or weight on file to calculate BMI. Ideal body weight: 73 kg (  160 lb 15 oz) Adjusted ideal body weight: 88.9 kg (196 lb)   BMI Readings from Last 4 Encounters:  02/24/18 35.67 kg/m  02/10/18 34.72 kg/m  01/23/18 34.29 kg/m  01/08/18 34.29 kg/m   Wt Readings from Last 4 Encounters:  02/24/18 248 lb 9.6 oz (112.8 kg)  02/10/18 242 lb (109.8 kg)  01/23/18 239 lb (108.4 kg)  01/08/18 239 lb (108.4 kg)  Psych/Mental status: Alert, oriented x 3 (person, place, & time)       Eyes: PERLA Respiratory: No evidence of acute respiratory distress  Cervical Spine Area Exam  Skin & Axial Inspection: No masses, redness, edema, swelling, or associated skin lesions Alignment: Symmetrical Functional ROM: Unrestricted ROM       Stability: No instability detected Muscle Tone/Strength: Functionally intact. No obvious neuro-muscular anomalies detected. Sensory (Neurological): Unimpaired Palpation: No palpable anomalies              Upper Extremity (UE) Exam    Side: Right upper extremity  Side: Left upper extremity  Skin & Extremity Inspection: Skin color, temperature, and hair growth are WNL. No peripheral edema or cyanosis. No masses, redness, swelling, asymmetry, or associated skin lesions. No contractures.  Skin & Extremity Inspection: Skin color, temperature, and hair growth are WNL. No peripheral edema or cyanosis. No masses, redness, swelling, asymmetry, or associated skin lesions. No contractures.  Functional ROM: Unrestricted ROM          Functional ROM: Unrestricted ROM          Muscle Tone/Strength: Functionally intact. No obvious neuro-muscular anomalies detected.  Muscle Tone/Strength: Functionally intact. No obvious neuro-muscular anomalies detected.  Sensory (Neurological): Unimpaired          Sensory (Neurological): Unimpaired          Palpation: No palpable anomalies              Palpation: No palpable anomalies              Provocative Test(s):  Phalen's test: deferred Tinel's test: deferred Apley's scratch test (touch opposite shoulder):  Action 1 (Across chest): deferred Action 2 (Overhead): deferred Action 3 (LB reach): deferred   Provocative Test(s):  Phalen's test: deferred Tinel's test: deferred Apley's scratch test (touch opposite shoulder):  Action 1 (Across chest): deferred Action 2 (Overhead): deferred Action 3 (LB reach): deferred    Thoracic Spine Area Exam  Skin & Axial Inspection: No masses, redness, or swelling Alignment: Symmetrical Functional ROM: Unrestricted ROM Stability: No instability detected Muscle Tone/Strength: Functionally intact. No obvious neuro-muscular anomalies detected. Sensory (Neurological): Unimpaired Muscle strength & Tone: No palpable  anomalies  Lumbar Spine Area Exam  Skin & Axial Inspection: No masses, redness, or swelling Alignment: Symmetrical Functional ROM: Unrestricted ROM       Stability: No instability detected Muscle Tone/Strength: Functionally intact. No obvious neuro-muscular anomalies detected. Sensory (Neurological): Unimpaired Palpation: No palpable anomalies       Provocative Tests: Hyperextension/rotation test: deferred today       Lumbar quadrant test (Kemp's test): deferred today       Lateral bending test: deferred today       Patrick's Maneuver: deferred today                   FABER test: deferred today                   S-I anterior distraction/compression test: deferred today         S-I lateral compression test: deferred  today         S-I Thigh-thrust test: deferred today         S-I Gaenslen's test: deferred today          Gait & Posture Assessment  Ambulation: Unassisted Gait: Relatively normal for age and body habitus Posture: WNL   Lower Extremity Exam    Side: Right lower extremity  Side: Left lower extremity  Stability: No instability observed          Stability: No instability observed          Skin & Extremity Inspection: Skin color, temperature, and hair growth are WNL. No peripheral edema or cyanosis. No masses, redness, swelling, asymmetry, or associated skin lesions. No contractures.  Skin & Extremity Inspection: Skin color, temperature, and hair growth are WNL. No peripheral edema or cyanosis. No masses, redness, swelling, asymmetry, or associated skin lesions. No contractures.  Functional ROM: Unrestricted ROM                  Functional ROM: Unrestricted ROM                  Muscle Tone/Strength: Functionally intact. No obvious neuro-muscular anomalies detected.  Muscle Tone/Strength: Functionally intact. No obvious neuro-muscular anomalies detected.  Sensory (Neurological): Unimpaired  Sensory (Neurological): Unimpaired  Palpation: No palpable anomalies  Palpation: No  palpable anomalies   Assessment & Plan  Primary Diagnosis & Pertinent Problem List: The primary encounter diagnosis was Chronic pain syndrome. Diagnoses of Chronic foot pain (Primary Area of Pain) (Bilateral) (L>R), Diabetic foot infection ([place]), Diabetic peripheral neuropathy ([place]), Chronic lower extremity pain (Secondary Area of Pain) (Bilateral) (L>R), Neurogenic pain, and Vitamin D insufficiency were also pertinent to this visit.  Visit Diagnosis: 1. Chronic pain syndrome   2. Chronic foot pain (Primary Area of Pain) (Bilateral) (L>R)   3. Diabetic foot infection ([place])   4. Diabetic peripheral neuropathy ([place])   5. Chronic lower extremity pain (Secondary Area of Pain) (Bilateral) (L>R)   6. Neurogenic pain   7. Vitamin D insufficiency    Problems updated and reviewed during this visit: Problem  Atherosclerosis of Native Arteries of The Extremities With Ulceration ([facility])  Diabetic Polyneuropathy ([facility])  Diabetic Peripheral Neuropathy ([facility])  Neurogenic Pain  Chronic foot pain (Primary Area of Pain) (Bilateral) (L>R)  Chronic lower extremity pain (Secondary Area of Pain) (Bilateral) (L>R)  Chronic Pain Syndrome  Osteomyelitis ([facility])  Cellulitis of Left Foot  Hardware Complicating Wound Infection ([facility])  Diabetes Mellitus Due to Underlying Condition, Uncontrolled, With Diabetic Neuropathy ([facility])  Metatarsal Stress Fracture of Left Foot  Diabetes Mellitus With Diabetic Polyneuropathy ([facility])  Diabetic Foot Infection ([facility])  Diabetic Ulcer of Foot Associated With Type 2 Diabetes Mellitus, With Necrosis of Muscle ([facility])  Sleep Apnea    does not wear CPAP   Vitamin D Insufficiency  Long Term Current Use of Opiate Analgesic  Pharmacologic Therapy  Disorder of Skeletal System  Problems Influencing Health Status  Bacterial Infection Due to Morganella Morganii  Sepsis Due to Streptococcus, Group B ([facility])  Htn (Hypertension)  Hld (Hyperlipidemia)  Surgery, Elective    Plan of Care   Pharmacotherapy (Medications Ordered): No orders of the defined types were placed in this encounter.  Procedure Orders    No procedure(s) ordered today   Lab Orders  No laboratory test(s) ordered today   Imaging Orders  No imaging studies ordered today   Referral Orders  No referral(s) requested today    Pharmacological management options:  Opioid Analgesics: We'll take over management today. See above orders Membrane stabilizer: We have discussed the possibility of optimizing this mode of therapy, if tolerated Muscle relaxant: We have discussed the possibility of a trial NSAID: We have discussed the possibility of a trial Other analgesic(s): To be determined at a later time   Interventional management options: Planned, scheduled, and/or pending:    NOTE: Avoid STEROIDS!!! Diagnostic bilateral lumbar sympathetic block #1 under fluoroscopic guidance and IV sedation   Considering:   Diagnostic bilateral lumbar sympathetic block #1  Possible bilateral lumbar sympathetic RFA  Diagnostic superficial peroneal nerve block  Possible superficial peroneal nerve RFA  Diagnostic common peroneal nerve block  Possible common peroneal nerve RFA  Possible spinal cord stimulator trial    PRN Procedures:   None at this time   Provider-requested follow-up: No follow-ups on file.  Future Appointments  Date Time Provider [place]  02/25/2018  8:45 AM Milinda Pointer, MD [facility] None  03/03/2018 10:00 AM Golden Circle, FNP [facility]  03/10/2018  9:00 AM [facility] None    Primary Care Physician: Lorelee Market, MD Location: [facility] Note by: Gaspar Cola, MD Date: 02/25/2018; Time: 6:34 PM

## 2018-02-24 NOTE — Assessment & Plan Note (Signed)
blood pressure control important in reducing the progression of atherosclerotic disease. On appropriate oral medications.  

## 2018-02-24 NOTE — Assessment & Plan Note (Signed)
Very good control as it slows the progression of atherosclerotic disease

## 2018-02-24 NOTE — Progress Notes (Signed)
MRN : [id number]  [name] is a 57 y.o. (1961/06/26) male who presents with chief complaint of  Chief Complaint  Patient presents with  . Follow-up    [facility] follow up  .  History of Present Illness: Patient returns today in follow up of PAD and ulcerations.  About 2 to 3 months ago, he underwent bilateral lower extremity revascularization in a staged fashion for nonhealing ulcerations.  He was following with the wound clinic and he continues to get local wound care.  His right foot has healed.  He has a persistent ulceration on the lateral left foot with associated pain.  There is also swelling and some drainage.  No fevers or chills.  Current Outpatient Medications  Medication Sig Dispense Refill  . aspirin EC 81 MG tablet Take 81 mg by mouth daily.    Marland Kitchen FARXIGA 10 MG TABS tablet Take 1 tablet by mouth daily.    Marland Kitchen HUMALOG KWIKPEN 100 UNIT/ML KiwkPen Inject 0-15 Units into the skin 3 (three) times daily. MAX-45 units a day    . Insulin Glargine (BASAGLAR KWIKPEN) 100 UNIT/ML SOPN Inject 0.35 mLs (35 Units total) into the skin at bedtime. 30 pen 3  . levofloxacin (LEVAQUIN) 500 MG tablet Take 1 tablet (500 mg total) by mouth daily. 30 tablet 0  . losartan (COZAAR) 50 MG tablet Take 50 mg by mouth daily.    . metFORMIN (GLUCOPHAGE) 1000 MG tablet Take 1,000 mg by mouth 2 (two) times daily.    . TRADJENTA 5 MG TABS tablet Take 1 tablet by mouth daily.     No current facility-administered medications for this visit.    Facility-Administered Medications Ordered in Other Visits  Medication Dose Route Frequency Provider Last Rate Last Dose  . Tdap (BOOSTRIX) injection 0.5 mL  0.5 mL Intramuscular Once Betancourt, Aura Fey, NP        Past Medical History:  Diagnosis Date  . Arthritis   . Bacterial infection due to Morganella morganii 12/26/2017  . BPH (benign prostatic hypertrophy)   . Diabetes mellitus   . Diabetic neuropathy ([place])   . Hypertension   . Metatarsal bone fracture      left 5th toe  . Osteomyelitis of toe of right foot ([place])   . Post-operative infection    and diabetic ulcer left foot  . Sepsis due to Streptococcus, group B ([place]) 12/26/2017  . Sleep apnea     does not wear CPAP  . Wears glasses     Past Surgical History:  Procedure Laterality Date  . COLONOSCOPY W/ BIOPSIES AND POLYPECTOMY    . I&D EXTREMITY Left 01/11/2016   Procedure: LEFT FOOT IRRIGATION AND DEBRIDEMENT WOUND VAC AND REMOVAL OF HARDWARE;  Surgeon: Wylene Simmer, MD;  Location: [place];  Service: Orthopedics;  Laterality: Left;  . LOWER EXTREMITY ANGIOGRAPHY N/A 12/01/2017   Procedure: Lower Extremity Angiography;  Surgeon: Algernon Huxley, MD;  Location: [place];  Service: Cardiovascular;  Laterality: N/A;  . LOWER EXTREMITY ANGIOGRAPHY Right 12/04/2017   Procedure: Lower Extremity Angiography;  Surgeon: Algernon Huxley, MD;  Location: [place] [facility];  Service: Cardiovascular;  Laterality: Right;  . ORIF TOE FRACTURE Left 06/08/2015   Procedure: OPEN REDUCTION INTERNAL FIXATION (ORIF) LEFT FIFTH METATARSAL BASE FRACTURE NONUNION; CALCANEAL AUTOGRAFT ;  Surgeon: Wylene Simmer, MD;  Location: [place];  Service: Orthopedics;  Laterality: Left;  . PATELLA RECONSTRUCTION Left 2005  . PROSTATE ABLATION  2014  .  TOE AMPUTATION     partial amputation right great toe  . WOUND DEBRIDEMENT Bilateral 11/30/2017   Procedure: DEBRIDEMENT WOUND;  Surgeon: Sharlotte Alamo, DPM;  Location: [facility];  Service: Podiatry;  Laterality: Bilateral;    Social History Social History   Tobacco Use  . Smoking status: Never Smoker  . Smokeless tobacco: Never Used  Substance Use Topics  . Alcohol use: Yes    Comment: occasional   . 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 Diabetes Mother   . Diabetes Other   No bleeding disorders, clotting disorders, or  autoimmune diseases   Allergies  Allergen Reactions  . Bactrim [Sulfamethoxazole-Trimethoprim] Hives     REVIEW OF SYSTEMS (Negative unless checked)  Constitutional: []Weight loss  []Fever  []Chills Cardiac: []Chest pain   []Chest pressure   []Palpitations   []Shortness of breath when laying flat   []Shortness of breath at rest   []Shortness of breath with exertion. Vascular:  []Pain in legs with walking   []Pain in legs at rest   []Pain in legs when laying flat   []Claudication   []Pain in feet when walking  [x]Pain in feet at rest  []Pain in feet when laying flat   []History of DVT   []Phlebitis   []Swelling in legs   []Varicose veins   [x]Non-healing ulcers Pulmonary:   []Uses home oxygen   []Productive cough   []Hemoptysis   []Wheeze  []COPD   []Asthma Neurologic:  []Dizziness  []Blackouts   []Seizures   []History of stroke   []History of TIA  []Aphasia   []Temporary blindness   []Dysphagia   []Weakness or numbness in arms   []Weakness or numbness in legs Musculoskeletal:  []Arthritis   []Joint swelling   []Joint pain   []Low back pain Hematologic:  []Easy bruising  []Easy bleeding   []Hypercoagulable state   []Anemic   Gastrointestinal:  []Blood in stool   []Vomiting blood  []Gastroesophageal reflux/heartburn   []Abdominal pain Genitourinary:  []Chronic kidney disease   []Difficult urination  []Frequent urination  []Burning with urination   []Hematuria Skin:  []Rashes   [x]Ulcers   [x]Wounds Psychological:  []History of anxiety   [] History of major depression.  Physical Examination  BP (!) 169/95 (BP Location: Right Arm)   Pulse 92   Resp 16   Ht 5' 10" (1.778 m)   Wt 248 lb 9.6 oz (112.8 kg)   BMI 35.67 kg/m  Gen:  WD/WN, NAD Head: [place]/AT, No temporalis wasting. Ear/Nose/Throat: Hearing grossly intact, nares w/o erythema or drainage Eyes: Conjunctiva clear. Sclera non-icteric Neck: Supple.  Trachea midline Pulmonary:  Good air movement, no use of accessory muscles.    Cardiac: RRR, no JVD Vascular:  Vessel Right Left  Radial Palpable Palpable                          PT  1+ palpable  not palpable  DP  1+ palpable  1+ palpable    Musculoskeletal: M/S 5/5 throughout.  Some left foot deformity is seen with ulceration as below.  1-2+ left lower extremity swelling Neurologic: Sensation grossly intact in extremities.  Symmetrical.  Speech is fluent.  Psychiatric: Judgment intact, Mood & affect appropriate for pt's clinical situation. Dermatologic: Small circular wound at the midportion of  the left lateral foot with mild serous drainage.  There is 1-2+ swelling around the area and mild erythema.       Labs Recent Results (from the past 2160 hour(s))  Lactic acid, plasma     Status: None   Collection Time: 11/28/17  7:34 PM  Result Value Ref Range   Lactic Acid, Venous 0.9 0.5 - 1.9 mmol/L    Comment: Performed at [facility], [address] [place], [place] [address]  Comprehensive metabolic panel     Status: Abnormal   Collection Time: 11/28/17  7:34 PM  Result Value Ref Range   Sodium 134 (L) 135 - 145 mmol/L   Potassium 3.6 3.5 - 5.1 mmol/L   Chloride 97 (L) 101 - 111 mmol/L   CO2 27 22 - 32 mmol/L   Glucose, Bld 196 (H) 65 - 99 mg/dL   BUN 15 6 - 20 mg/dL   Creatinine, Ser 0.82 0.61 - 1.24 mg/dL   Calcium 8.9 8.9 - 10.3 mg/dL   Total Protein 8.3 (H) 6.5 - 8.1 g/dL   Albumin 3.4 (L) 3.5 - 5.0 g/dL   AST 17 15 - 41 U/L   ALT 15 (L) 17 - 63 U/L   Alkaline Phosphatase 105 38 - 126 U/L   Total Bilirubin 0.5 0.3 - 1.2 mg/dL   GFR calc non Af Amer >60 >60 mL/min   GFR calc Af Amer >60 >60 mL/min    Comment: (NOTE) The eGFR has been calculated using the CKD EPI equation. This calculation has not been validated in all clinical situations. eGFR's persistently <60 mL/min signify possible Chronic Kidney Disease.    Anion gap 10 5 - 15    Comment: Performed at [facility], [address] [place], [place] [address]   CBC with Differential     Status: Abnormal   Collection Time: 11/28/17  7:34 PM  Result Value Ref Range   WBC 10.3 3.8 - 10.6 K/uL   RBC 4.38 (L) 4.40 - 5.90 MIL/uL   Hemoglobin 14.0 13.0 - 18.0 g/dL   HCT 40.5 40.0 - 52.0 %   MCV 92.5 80.0 - 100.0 fL   MCH 32.0 26.0 - 34.0 pg   MCHC 34.5 32.0 - 36.0 g/dL   RDW 13.3 11.5 - 14.5 %   Platelets 242 150 - 440 K/uL   Neutrophils Relative % 70 %   Neutro Abs 7.3 (H) 1.4 - 6.5 K/uL   Lymphocytes Relative 16 %   Lymphs Abs 1.7 1.0 - 3.6 K/uL   Monocytes Relative 11 %   Monocytes Absolute 1.1 (H) 0.2 - 1.0 K/uL   Eosinophils Relative 2 %   Eosinophils Absolute 0.2 0 - 0.7 K/uL   Basophils Relative 1 %   Basophils Absolute 0.0 0 - 0.1 K/uL    Comment: Performed at [facility], [address] [place], [place] [address]  Sedimentation rate     Status: Abnormal   Collection Time: 11/28/17  7:34 PM  Result Value Ref Range   Sed Rate 74 (H) 0 - 20 mm/hr    Comment: Performed at [facility], [address] [place], [place] [address]  Glucose, capillary     Status: Abnormal   Collection Time: 11/29/17  2:14 AM  Result Value Ref Range   Glucose-Capillary 136 (H) 65 - 99 mg/dL  Basic metabolic panel     Status: Abnormal   Collection Time: 11/29/17  5:49 AM  Result Value Ref Range  Sodium 136 135 - 145 mmol/L   Potassium 3.4 (L) 3.5 - 5.1 mmol/L   Chloride 102 101 - 111 mmol/L   CO2 25 22 - 32 mmol/L   Glucose, Bld 109 (H) 65 - 99 mg/dL   BUN 10 6 - 20 mg/dL   Creatinine, Ser 0.64 0.61 - 1.24 mg/dL   Calcium 8.4 (L) 8.9 - 10.3 mg/dL   GFR calc non Af Amer >60 >60 mL/min   GFR calc Af Amer >60 >60 mL/min    Comment: (NOTE) The eGFR has been calculated using the CKD EPI equation. This calculation has not been validated in all clinical situations. eGFR's persistently <60 mL/min signify possible Chronic Kidney Disease.    Anion gap 9 5 - 15    Comment: Performed at [facility], [address]  [place], [place] [address]  CBC     Status: Abnormal   Collection Time: 11/29/17  5:49 AM  Result Value Ref Range   WBC 7.5 3.8 - 10.6 K/uL   RBC 3.99 (L) 4.40 - 5.90 MIL/uL   Hemoglobin 12.8 (L) 13.0 - 18.0 g/dL   HCT 37.1 (L) 40.0 - 52.0 %   MCV 92.9 80.0 - 100.0 fL   MCH 32.1 26.0 - 34.0 pg   MCHC 34.5 32.0 - 36.0 g/dL   RDW 13.2 11.5 - 14.5 %   Platelets 210 150 - 440 K/uL    Comment: Performed at [facility], [address] [place], [place] [address]  Hemoglobin A1c     Status: Abnormal   Collection Time: 11/29/17  5:49 AM  Result Value Ref Range   Hgb A1c MFr Bld 8.8 (H) 4.8 - 5.6 %    Comment: (NOTE) Pre diabetes:          5.7%-6.4% Diabetes:              >6.4% Glycemic control for   <7.0% adults with diabetes    Mean Plasma Glucose 205.86 mg/dL    Comment: Performed at [facility], [place] [address] [place], [place] [address]  Glucose, capillary     Status: Abnormal   Collection Time: 11/29/17  8:00 AM  Result Value Ref Range   Glucose-Capillary 112 (H) 65 - 99 mg/dL  Glucose, capillary     Status: Abnormal   Collection Time: 11/29/17  1:13 PM  Result Value Ref Range   Glucose-Capillary 117 (H) 65 - 99 mg/dL  Glucose, capillary     Status: Abnormal   Collection Time: 11/29/17  4:51 PM  Result Value Ref Range   Glucose-Capillary 208 (H) 65 - 99 mg/dL  Glucose, capillary     Status: Abnormal   Collection Time: 11/29/17  9:01 PM  Result Value Ref Range   Glucose-Capillary 198 (H) 65 - 99 mg/dL  Glucose, capillary     Status: Abnormal   Collection Time: 11/30/17  7:40 AM  Result Value Ref Range   Glucose-Capillary 128 (H) 65 - 99 mg/dL  Vancomycin, trough     Status: Abnormal   Collection Time: 11/30/17  7:56 AM  Result Value Ref Range   Vancomycin Tr 13 (L) 15 - 20 ug/mL    Comment: Performed at [facility], [address] [place], [place] [address]  Creatinine, serum     Status: None   Collection Time: 11/30/17  7:56 AM  Result Value  Ref Range   Creatinine, Ser 0.69 0.61 - 1.24 mg/dL   GFR calc non Af Amer >  60 >60 mL/min   GFR calc Af Amer >60 >60 mL/min    Comment: (NOTE) The eGFR has been calculated using the CKD EPI equation. This calculation has not been validated in all clinical situations. eGFR's persistently <60 mL/min signify possible Chronic Kidney Disease. Performed at [facility], [address] [place], [place] [address]   Aerobic/Anaerobic Culture (surgical/deep wound)     Status: None   Collection Time: 11/30/17 11:27 AM  Result Value Ref Range   Specimen Description      BONE RIGHT FOOT Performed at [facility] [address] [place], [place] [address]    Special Requests      NONE Performed at [facility], [place], [place] [address]    Gram Stain NO WBC SEEN NO ORGANISMS SEEN     Culture      RARE MORGANELLA MORGANII CRITICAL RESULT CALLED TO, READ BACK BY AND VERIFIED WITH: E GANNON,RN AT 1206 12/01/17 BY L BENFIELD CONCERNING GROWTH ON CULTURE NO ANAEROBES ISOLATED Performed at [facility], [place] [address], [place],  [address]    Report Status 12/06/2017 FINAL    Organism ID, Bacteria MORGANELLA MORGANII       Susceptibility   Morganella morganii - MIC*    AMPICILLIN >=32 RESISTANT Resistant     CEFAZOLIN >=64 RESISTANT Resistant     CEFEPIME <=1 SENSITIVE Sensitive     CEFTAZIDIME <=1 SENSITIVE Sensitive     CEFTRIAXONE <=1 SENSITIVE Sensitive     CIPROFLOXACIN <=0.25 SENSITIVE Sensitive     GENTAMICIN <=1 SENSITIVE Sensitive     IMIPENEM 4 SENSITIVE Sensitive     TRIMETH/SULFA <=20 SENSITIVE Sensitive     AMPICILLIN/SULBACTAM 16 INTERMEDIATE Intermediate     PIP/TAZO <=4 SENSITIVE Sensitive     * RARE MORGANELLA MORGANII  Surgical pathology     Status: None   Collection Time: 11/30/17 11:33 AM  Result Value Ref Range   SURGICAL PATHOLOGY      Surgical Pathology CASE: [id number] PATIENT: Sherian [facility] Surgical  Pathology Report     SPECIMEN SUBMITTED: A. Ray, fifth, right foot; resection B. Metatarsal, left fifth  CLINICAL HISTORY: Osteomyelitis right fifth metatarsal head and toe. Osteomyelitis left fifth metatarsal base.  PRE-OPERATIVE DIAGNOSIS: Bilateral diabetic foot wounds  POST-OPERATIVE DIAGNOSIS: Bilateral diabetic foot wounds     DIAGNOSIS: A.  FIFTH RAY, RIGHT FOOT; AMPUTATION: - ULCERATION AND OSTEOMYELITIS. - FOCALLY VIABLE BONY MARGINS. - VIABLE SKIN MARGINS.  B.  LEFT FIFTH METATARSAL; DEBRIDEMENT: - BONE AND ARTICULAR CARTILAGE WITH FOCAL CHANGES COMPATIBLE WITH PROVIDED CLINICAL HISTORY OF FRACTURE.   GROSS DESCRIPTION: A. Labeled: Fifth ray right foot Received: In formalin Size: 6.1 x 2.0 x 1.5 cm Description of lesion(s): Slight discolored purple-yellow skin Proximal margin: Discoloration extends to margin Bone: Bone at margin is a smooth cut surface Other findin gs: Margin inked blue  Block summary: 1 - perpendicular skin margin 2 - en face bone at margin 3 - representative bone underlying skin discoloration and joint bone  Tissue decalcification: Cassettes 2 and 3  B. Labeled: Left fifth metatarsal Received: In formalin Size: 2 fragments of tissue, 2.2 x 1.4 x 0.5 cm fragment of bone and a 2.8 x 1.5 x 1.1 cm fragment of diffusely disrupted bony fragment with attached soft tissue Description of lesion(s): Focal bony disruption Proximal margin: Margin cannot be identified, first fragment has smooth cut surfaces on both sides, second and largest fragment has 1 smooth cut surface which  is marked blue Bone: Exact bone margin cannot be grossly identified Other findings: None noted  Block summary: 1 - perpendicular representative largest fragment of bone 2 - representative full-thickness section second fragment of bone  Tissue decalcification: Yes cassette 1 and 2    Final Diagnosis performed by Delorse Lek, MD.   Electro nically  signed 12/02/2017 2:01:10PM The electronic signature indicates that the named Attending Pathologist has evaluated the specimen  Technical component performed at [facility], [address] [place], [place] [address] Lab: [phone] Dir: Rush Farmer, MD, MMM  Professional component performed at [facility], [facility], [address], [place], [place] [address] Lab: [phone] Dir: Dellia Nims. Rubinas, MD   Aerobic/Anaerobic Culture (surgical/deep wound)     Status: None   Collection Time: 11/30/17 12:02 PM  Result Value Ref Range   Specimen Description BONE LEFT FOOT    Special Requests MAXIPIME VANCOMYCIN    Gram Stain      RARE WBC PRESENT, PREDOMINANTLY PMN NO ORGANISMS SEEN    Culture      RARE GROUP B STREP(S.AGALACTIAE)ISOLATED TESTING AGAINST S. AGALACTIAE NOT ROUTINELY PERFORMED DUE TO PREDICTABILITY OF AMP/PEN/VAN SUSCEPTIBILITY. RARE MORGANELLA MORGANII SUSCEPTIBILITIES PERFORMED ON PREVIOUS CULTURE WITHIN THE LAST 5 DAYS. CRITICAL RESULT CALLED TO, READ BACK BY AND VERIFIED WITH: E GANNON AT 1206 12/01/17 BY L BENFIELD CONCERNING GROWTH ON CULTURE NO ANAEROBES ISOLATED Performed at [facility], [place] [address], [place], [place] [address]    Report Status 12/06/2017 FINAL   Glucose, capillary     Status: Abnormal   Collection Time: 11/30/17 12:48 PM  Result Value Ref Range   Glucose-Capillary 150 (H) 65 - 99 mg/dL  Glucose, capillary     Status: Abnormal   Collection Time: 11/30/17  6:26 PM  Result Value Ref Range   Glucose-Capillary 252 (H) 65 - 99 mg/dL   Comment 1 Notify RN   Glucose, capillary     Status: Abnormal   Collection Time: 11/30/17  9:02 PM  Result Value Ref Range   Glucose-Capillary 251 (H) 65 - 99 mg/dL  Glucose, capillary     Status: Abnormal   Collection Time: 12/01/17  7:38 AM  Result Value Ref Range   Glucose-Capillary 139 (H) 65 - 99 mg/dL  Glucose, capillary     Status: None   Collection Time: 12/01/17  3:09 PM  Result  Value Ref Range   Glucose-Capillary 95 65 - 99 mg/dL  Vancomycin, trough     Status: Abnormal   Collection Time: 12/01/17  3:55 PM  Result Value Ref Range   Vancomycin Tr 13 (L) 15 - 20 ug/mL    Comment: Performed at [facility], [address] [place], [place] [address]  Glucose, capillary     Status: None   Collection Time: 12/01/17  4:41 PM  Result Value Ref Range   Glucose-Capillary 69 65 - 99 mg/dL  Glucose, capillary     Status: Abnormal   Collection Time: 12/01/17  5:45 PM  Result Value Ref Range   Glucose-Capillary 161 (H) 65 - 99 mg/dL  Glucose, capillary     Status: Abnormal   Collection Time: 12/01/17  9:27 PM  Result Value Ref Range   Glucose-Capillary 163 (H) 65 - 99 mg/dL  CBC     Status: Abnormal   Collection Time: 12/02/17  5:26 AM  Result Value Ref Range   WBC 7.8 3.8 - 10.6 K/uL   RBC 3.91 (L) 4.40 - 5.90 MIL/uL   Hemoglobin  12.6 (L) 13.0 - 18.0 g/dL   HCT 36.5 (L) 40.0 - 52.0 %   MCV 93.4 80.0 - 100.0 fL   MCH 32.3 26.0 - 34.0 pg   MCHC 34.6 32.0 - 36.0 g/dL   RDW 13.4 11.5 - 14.5 %   Platelets 237 150 - 440 K/uL    Comment: Performed at [facility], [address] [place], [place] [address]  Basic metabolic panel     Status: Abnormal   Collection Time: 12/02/17  5:26 AM  Result Value Ref Range   Sodium 137 135 - 145 mmol/L   Potassium 3.9 3.5 - 5.1 mmol/L   Chloride 105 101 - 111 mmol/L   CO2 24 22 - 32 mmol/L   Glucose, Bld 77 65 - 99 mg/dL   BUN 8 6 - 20 mg/dL   Creatinine, Ser 0.58 (L) 0.61 - 1.24 mg/dL   Calcium 8.1 (L) 8.9 - 10.3 mg/dL   GFR calc non Af Amer >60 >60 mL/min   GFR calc Af Amer >60 >60 mL/min    Comment: (NOTE) The eGFR has been calculated using the CKD EPI equation. This calculation has not been validated in all clinical situations. eGFR's persistently <60 mL/min signify possible Chronic Kidney Disease.    Anion gap 8 5 - 15    Comment: Performed at [facility], [place], [place] [address]  Glucose, capillary     Status: None   Collection Time: 12/02/17  8:06 AM  Result Value Ref Range   Glucose-Capillary 67 65 - 99 mg/dL  Glucose, capillary     Status: Abnormal   Collection Time: 12/02/17 10:03 AM  Result Value Ref Range   Glucose-Capillary 144 (H) 65 - 99 mg/dL  Glucose, capillary     Status: Abnormal   Collection Time: 12/02/17 11:35 AM  Result Value Ref Range   Glucose-Capillary 139 (H) 65 - 99 mg/dL  Glucose, capillary     Status: Abnormal   Collection Time: 12/02/17  5:55 PM  Result Value Ref Range   Glucose-Capillary 137 (H) 65 - 99 mg/dL  Glucose, capillary     Status: Abnormal   Collection Time: 12/02/17  9:04 PM  Result Value Ref Range   Glucose-Capillary 143 (H) 65 - 99 mg/dL  Glucose, capillary     Status: Abnormal   Collection Time: 12/03/17  8:14 AM  Result Value Ref Range   Glucose-Capillary 122 (H) 65 - 99 mg/dL  Glucose, capillary     Status: Abnormal   Collection Time: 12/03/17 12:30 PM  Result Value Ref Range   Glucose-Capillary 126 (H) 65 - 99 mg/dL  Glucose, capillary     Status: Abnormal   Collection Time: 12/03/17  4:27 PM  Result Value Ref Range   Glucose-Capillary 167 (H) 65 - 99 mg/dL  Glucose, capillary     Status: Abnormal   Collection Time: 12/03/17  9:17 PM  Result Value Ref Range   Glucose-Capillary 118 (H) 65 - 99 mg/dL  Glucose, capillary     Status: Abnormal   Collection Time: 12/04/17  7:22 AM  Result Value Ref Range   Glucose-Capillary 138 (H) 65 - 99 mg/dL  Glucose, capillary     Status: Abnormal   Collection Time: 12/04/17 10:44 AM  Result Value Ref Range   Glucose-Capillary 100 (H) 65 - 99 mg/dL  Glucose, capillary     Status: Abnormal   Collection Time: 12/04/17 12:46 PM  Result Value Ref Range  Glucose-Capillary 107 (H) 65 - 99 mg/dL  Glucose, capillary     Status: Abnormal   Collection Time: 12/04/17  5:20 PM  Result Value Ref Range   Glucose-Capillary 116 (H) 65 - 99 mg/dL  Glucose,  capillary     Status: Abnormal   Collection Time: 12/04/17  9:44 PM  Result Value Ref Range   Glucose-Capillary 230 (H) 65 - 99 mg/dL  Creatinine, serum     Status: None   Collection Time: 12/05/17  4:41 AM  Result Value Ref Range   Creatinine, Ser 0.67 0.61 - 1.24 mg/dL   GFR calc non Af Amer >60 >60 mL/min   GFR calc Af Amer >60 >60 mL/min    Comment: (NOTE) The eGFR has been calculated using the CKD EPI equation. This calculation has not been validated in all clinical situations. eGFR's persistently <60 mL/min signify possible Chronic Kidney Disease. Performed at [facility], [address] [place], [place] [address]   CBC     Status: Abnormal   Collection Time: 12/05/17  4:41 AM  Result Value Ref Range   WBC 7.2 3.8 - 10.6 K/uL   RBC 4.18 (L) 4.40 - 5.90 MIL/uL   Hemoglobin 13.3 13.0 - 18.0 g/dL   HCT 38.9 (L) 40.0 - 52.0 %   MCV 93.0 80.0 - 100.0 fL   MCH 31.9 26.0 - 34.0 pg   MCHC 34.3 32.0 - 36.0 g/dL   RDW 13.3 11.5 - 14.5 %   Platelets 250 150 - 440 K/uL    Comment: Performed at [facility], [address] [place], [place] [address]  Glucose, capillary     Status: None   Collection Time: 12/05/17  8:14 AM  Result Value Ref Range   Glucose-Capillary 98 65 - 99 mg/dL  Comprehensive metabolic panel     Status: Abnormal   Collection Time: 01/08/18  9:18 AM  Result Value Ref Range   Sodium 139 135 - 145 mmol/L   Potassium 3.4 (L) 3.5 - 5.1 mmol/L   Chloride 102 98 - 111 mmol/L    Comment: Please note change in reference range.   CO2 26 22 - 32 mmol/L   Glucose, Bld 169 (H) 70 - 99 mg/dL    Comment: Please note change in reference range.   BUN 21 (H) 6 - 20 mg/dL    Comment: Please note change in reference range.   Creatinine, Ser 1.31 (H) 0.61 - 1.24 mg/dL   Calcium 9.0 8.9 - 10.3 mg/dL   Total Protein 8.1 6.5 - 8.1 g/dL   Albumin 3.8 3.5 - 5.0 g/dL   AST 45 (H) 15 - 41 U/L   ALT 22 0 - 44 U/L    Comment: Please note change in reference  range.   Alkaline Phosphatase 99 38 - 126 U/L   Total Bilirubin 0.8 0.3 - 1.2 mg/dL   GFR calc non Af Amer 59 (L) >60 mL/min   GFR calc Af Amer >60 >60 mL/min    Comment: (NOTE) The eGFR has been calculated using the CKD EPI equation. This calculation has not been validated in all clinical situations. eGFR's persistently <60 mL/min signify possible Chronic Kidney Disease.    Anion gap 11 5 - 15    Comment: Performed at [facility], [place], [place] [address]  Lactic acid, plasma     Status: None   Collection Time: 01/08/18  9:18 AM  Result Value Ref Range   Lactic Acid,  Venous 1.3 0.5 - 1.9 mmol/L    Comment: Performed at [facility], [address] [place], [place] [address]  CBC with Differential     Status: Abnormal   Collection Time: 01/08/18  9:18 AM  Result Value Ref Range   WBC 11.4 (H) 3.8 - 10.6 K/uL   RBC 4.47 4.40 - 5.90 MIL/uL   Hemoglobin 14.3 13.0 - 18.0 g/dL   HCT 41.5 40.0 - 52.0 %   MCV 92.8 80.0 - 100.0 fL   MCH 31.9 26.0 - 34.0 pg   MCHC 34.4 32.0 - 36.0 g/dL   RDW 13.7 11.5 - 14.5 %   Platelets 273 150 - 440 K/uL   Neutrophils Relative % 68 %   Neutro Abs 7.8 (H) 1.4 - 6.5 K/uL   Lymphocytes Relative 21 %   Lymphs Abs 2.3 1.0 - 3.6 K/uL   Monocytes Relative 11 %   Monocytes Absolute 1.3 (H) 0.2 - 1.0 K/uL   Eosinophils Relative 0 %   Eosinophils Absolute 0.0 0 - 0.7 K/uL   Basophils Relative 0 %   Basophils Absolute 0.0 0 - 0.1 K/uL    Comment: Performed at [facility], [address], [place], [place] [address]  Protime-INR     Status: None   Collection Time: 01/08/18  9:18 AM  Result Value Ref Range   Prothrombin Time 13.6 11.4 - 15.2 seconds   INR 1.05     Comment: Performed at [facility], [address] [place], [place] [address]  Culture, blood (Routine x 2)     Status: None   Collection Time: 01/08/18  9:18 AM  Result Value Ref Range   Specimen Description BLOOD RIGHT ANTECUBITAL     Special Requests      BOTTLES DRAWN AEROBIC AND ANAEROBIC Blood Culture adequate volume   Culture      NO GROWTH 5 DAYS Performed at [facility], [address] [place], [place] [address]    Report Status 01/13/2018 FINAL   Culture, blood (Routine x 2)     Status: None   Collection Time: 01/08/18 10:37 AM  Result Value Ref Range   Specimen Description BLOOD BLOOD RIGHT FOREARM    Special Requests      BOTTLES DRAWN AEROBIC AND ANAEROBIC Blood Culture results may not be optimal due to an excessive volume of blood received in culture bottles   Culture      NO GROWTH 5 DAYS Performed at [facility], [address] [place], [place] [address]    Report Status 01/13/2018 FINAL   Sedimentation rate     Status: Abnormal   Collection Time: 01/23/18 12:12 PM  Result Value Ref Range   Sed Rate 55 (H) 0 - 20 mm/h  C-reactive protein     Status: Abnormal   Collection Time: 01/23/18 12:12 PM  Result Value Ref Range   CRP 10.8 (H) <8.0 mg/L  Compliance Drug Analysis, Ur     Status: None   Collection Time: 02/10/18 11:34 AM  Result Value Ref Range   Summary FINAL     Comment: ==================================================================== [facility] COMP DRUG ANALYSIS,UR ==================================================================== Test                             Result       Flag       Units Drug Present not Declared for Prescription Verification   Alcohol, Ethyl  0.281        UNEXPECTED g/dL    Sources of ethyl alcohol include alcoholic beverages or as a    fermentation product of glucose; glucose is present in this    specimen.  Interpret result with caution, as the presence of    ethyl alcohol is likely due, at least in part, to fermentation of    glucose. Drug Absent but Declared for Prescription Verification   Salicylate                     Not Detected UNEXPECTED    Aspirin, as indicated in the declared medication list, is not     always detected even when used as directed. ==================================================================== Test                      Result    Flag   Units      Ref Range   C reatinine              99               mg/dL      >=20 ==================================================================== Declared Medications:  The flagging and interpretation on this report are based on the  following declared medications.  Unexpected results may arise from  inaccuracies in the declared medications.  **Note: The testing scope of this panel does not include small to  moderate amounts of these reported medications:  Aspirin (Aspirin 81)  **Note: The testing scope of this panel does not include following  reported medications:  Dapagliflozin Wilder Glade)  Insulin (Humalog)  Levofloxacin (Levaquin)  Linagliptin (Tradjenta)  Losartan (Cozaar)  Metformin ==================================================================== For clinical consultation, please call [phone]. ====================================================================   Comp. Metabolic Panel (12)     Status: Abnormal   Collection Time: 02/10/18 11:54 AM  Result Value Ref Range   Glucose 142 (H) 65 - 99 mg/dL   BUN 13 6 - 24 mg/dL   Creatinine, Ser 0.96 0.76 - 1.27 mg/dL   GFR calc non Af Amer 88 >59 mL/min/1.73   GFR calc Af Amer 102 >59 mL/min/1.73   BUN/Creatinine Ratio 14 9 - 20   Sodium 139 134 - 144 mmol/L   Potassium 4.6 3.5 - 5.2 mmol/L   Chloride 99 96 - 106 mmol/L   Calcium 9.7 8.7 - 10.2 mg/dL   Total Protein 7.3 6.0 - 8.5 g/dL   Albumin 4.1 3.5 - 5.5 g/dL   Globulin, Total 3.2 1.5 - 4.5 g/dL   Albumin/Globulin Ratio 1.3 1.2 - 2.2   Bilirubin Total 0.4 0.0 - 1.2 mg/dL   Alkaline Phosphatase 110 39 - 117 IU/L   AST 11 0 - 40 IU/L  Magnesium     Status: None   Collection Time: 02/10/18 11:54 AM  Result Value Ref Range   Magnesium 2.1 1.6 - 2.3 mg/dL  Vitamin B12     Status: None   Collection  Time: 02/10/18 11:54 AM  Result Value Ref Range   Vitamin B-12 465 232 - 1,245 pg/mL  Sedimentation rate     Status: Abnormal   Collection Time: 02/10/18 11:54 AM  Result Value Ref Range   Sed Rate 51 (H) 0 - 30 mm/hr  25-Hydroxyvitamin D Lcms D2+D3     Status: Abnormal   Collection Time: 02/10/18 11:54 AM  Result Value Ref Range   25-Hydroxy, Vitamin D 25 (L) ng/mL    Comment: Reference Range: All Ages: Target levels 30 - 100  25-Hydroxy, Vitamin D-2 <1.0 ng/mL   25-Hydroxy, Vitamin D-3 25 ng/mL  C-reactive protein     Status: None   Collection Time: 02/10/18 11:54 AM  Result Value Ref Range   CRP 6 0 - 10 mg/L    Radiology No results found.  Assessment/Plan  HLD (hyperlipidemia) Very good control as it slows the progression of atherosclerotic disease  Diabetes mellitus due to underlying condition, uncontrolled, with diabetic neuropathy (HCC) blood glucose control important in reducing the progression of atherosclerotic disease. Also, involved in wound healing. On appropriate medications.   HTN (hypertension) blood pressure control important in reducing the progression of atherosclerotic disease. On appropriate oral medications.   Atherosclerosis of native arteries of the extremities with ulceration ([place]) The patient has persistent ulceration of the left foot with a known history of significant peripheral arterial disease.  This is a critical and limb threatening situation.  I have discussed with the patient that I think we should proceed with an angiogram for further evaluation and potential treatment of his peripheral arterial disease.  I have discussed the risks and benefits of the procedure.  I discussed that even with revascularization, limb loss was possible.  This will be scheduled in the near future at his convenience.    Leotis Pain, MD  02/24/2018 11:08 AM    This note was created with Dragon medical transcription system.  Any errors from dictation are  purely unintentional

## 2018-02-24 NOTE — Assessment & Plan Note (Signed)
The patient has persistent ulceration of the left foot with a known history of significant peripheral arterial disease.  This is a critical and limb threatening situation.  I have discussed with the patient that I think we should proceed with an angiogram for further evaluation and potential treatment of his peripheral arterial disease.  I have discussed the risks and benefits of the procedure.  I discussed that even with revascularization, limb loss was possible.  This will be scheduled in the near future at his convenience.

## 2018-02-25 ENCOUNTER — Ambulatory Visit: Payer: BLUE CROSS/BLUE SHIELD | Attending: Pain Medicine | Admitting: Pain Medicine

## 2018-02-26 ENCOUNTER — Telehealth (INDEPENDENT_AMBULATORY_CARE_PROVIDER_SITE_OTHER): Payer: Self-pay

## 2018-02-27 NOTE — Progress Notes (Deleted)
Subjective:    Patient ID: [name], male    DOB: August 15, 1960, 57 y.o.   MRN: [id number]  No chief complaint on file.    HPI:  [name] is a 57 y.o. male who presents today for routine follow up of osteomyelitis.  [name] was seen in the of on 01/23/18 with well healing wounds and continued scant drainage and no signs of systemic infection. His CRP at the time was 10.8 with ESR of 55. Levofloxacin was continued for 1 additional month.    Allergies  Allergen Reactions  . Bactrim [Sulfamethoxazole-Trimethoprim] Hives      Outpatient Medications Prior to Visit  Medication Sig Dispense Refill  . aspirin EC 81 MG tablet Take 81 mg by mouth daily.    Marland Kitchen FARXIGA 10 MG TABS tablet Take 1 tablet by mouth daily.    Marland Kitchen HUMALOG KWIKPEN 100 UNIT/ML KiwkPen Inject 0-15 Units into the skin 3 (three) times daily. MAX-45 units a day    . Insulin Glargine (BASAGLAR KWIKPEN) 100 UNIT/ML SOPN Inject 0.35 mLs (35 Units total) into the skin at bedtime. 30 pen 3  . levofloxacin (LEVAQUIN) 500 MG tablet Take 1 tablet (500 mg total) by mouth daily. 30 tablet 0  . losartan (COZAAR) 50 MG tablet Take 50 mg by mouth daily.    . metFORMIN (GLUCOPHAGE) 1000 MG tablet Take 1,000 mg by mouth 2 (two) times daily.    . TRADJENTA 5 MG TABS tablet Take 1 tablet by mouth daily.     Facility-Administered Medications Prior to Visit  Medication Dose Route Frequency Provider Last Rate Last Dose  . Tdap (BOOSTRIX) injection 0.5 mL  0.5 mL Intramuscular Once Betancourt, Aura Fey, NP         Past Medical History:  Diagnosis Date  . Arthritis   . Bacterial infection due to Morganella morganii 12/26/2017  . BPH (benign prostatic hypertrophy)   . Diabetes mellitus   . Diabetic neuropathy ([place])   . Hypertension   . Metatarsal bone fracture    left 5th toe  . Osteomyelitis of toe of right foot ([place])   . Post-operative infection    and diabetic ulcer left foot  . Sepsis due to Streptococcus, group B  ([place]) 12/26/2017  . Sleep apnea     does not wear CPAP  . Wears glasses      Past Surgical History:  Procedure Laterality Date  . COLONOSCOPY W/ BIOPSIES AND POLYPECTOMY    . I&D EXTREMITY Left 01/11/2016   Procedure: LEFT FOOT IRRIGATION AND DEBRIDEMENT WOUND VAC AND REMOVAL OF HARDWARE;  Surgeon: Wylene Simmer, MD;  Location: [facility];  Service: Orthopedics;  Laterality: Left;  . LOWER EXTREMITY ANGIOGRAPHY N/A 12/01/2017   Procedure: Lower Extremity Angiography;  Surgeon: Algernon Huxley, MD;  Location: [facility];  Service: Cardiovascular;  Laterality: N/A;  . LOWER EXTREMITY ANGIOGRAPHY Right 12/04/2017   Procedure: Lower Extremity Angiography;  Surgeon: Algernon Huxley, MD;  Location: [facility];  Service: Cardiovascular;  Laterality: Right;  . ORIF TOE FRACTURE Left 06/08/2015   Procedure: OPEN REDUCTION INTERNAL FIXATION (ORIF) LEFT FIFTH METATARSAL BASE FRACTURE NONUNION; CALCANEAL AUTOGRAFT ;  Surgeon: Wylene Simmer, MD;  Location: [facility];  Service: Orthopedics;  Laterality: Left;  . PATELLA RECONSTRUCTION Left 2005  . PROSTATE ABLATION  2014  . TOE AMPUTATION     partial amputation right great toe  . WOUND DEBRIDEMENT Bilateral 11/30/2017   Procedure: DEBRIDEMENT WOUND;  Surgeon: Sharlotte Alamo, DPM;  Location: [facility];  Service: Podiatry;  Laterality: Bilateral;       Review of Systems    Objective:    There were no vitals taken for this visit. Nursing note and vital signs reviewed.  Physical Exam     Assessment & Plan:   Problem List Items Addressed This Visit    None       I am having [name]. [name] maintain his aspirin EC, metFORMIN, FARXIGA, BASAGLAR KWIKPEN, losartan, TRADJENTA, HUMALOG KWIKPEN, and levofloxacin.   No orders of the defined types were placed in this encounter.    Follow-up: No follow-ups on file.   Terri Piedra, MSN, FNP-C Nurse Practitioner [facility] [place]  [facility] Office phone: [phone] Pager: [place] number: [phone]

## 2018-02-27 NOTE — Telephone Encounter (Signed)
I spoke with the patient and inform him with [facility] medical advise but the patient did inform that he do not go to the wound doctor anymore so I advise him to call his podiatrist

## 2018-03-03 ENCOUNTER — Ambulatory Visit: Payer: BLUE CROSS/BLUE SHIELD | Admitting: Family

## 2018-03-06 ENCOUNTER — Other Ambulatory Visit: Payer: BLUE CROSS/BLUE SHIELD

## 2018-03-08 NOTE — Progress Notes (Deleted)
Patient's Name: [name]  MRN: [id number]  Referring Provider: Lorelee Market, MD  DOB: [birth year]  PCP: Lorelee Market, MD  DOS: 03/11/2018  Note by: Gaspar Cola, MD  Service setting: Ambulatory outpatient  Specialty: Interventional Pain Management  Location: [facility] (AMB) Pain Management Facility    Patient type: Established   Primary Reason(s) for Visit: Encounter for evaluation before starting new chronic pain management plan of care (Level of risk: moderate) CC: No chief complaint on file.  HPI  [name] is a 57 y.o. year old, male patient, who comes today for a follow-up evaluation to review the test results and decide on a treatment plan. 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, with necrosis of muscle ([place]); Metatarsal stress fracture of left foot; Diabetic foot infection ([place]); Hardware complicating wound infection ([place]); Surgery, elective; Cellulitis of left foot; HTN (hypertension); HLD (hyperlipidemia); Osteomyelitis ([place]); Bacterial infection due to Morganella morganii; Sepsis due to Streptococcus, group B ([place]); Chronic foot pain (Primary Area of Pain) (Bilateral) (L>R); Chronic lower extremity pain (Secondary Area of Pain) (Bilateral) (L>R); Chronic pain syndrome; Long term current use of opiate analgesic; Pharmacologic therapy; Disorder of skeletal system; Problems influencing health status; Atherosclerosis of native arteries of the extremities with ulceration ([place]); Sleep apnea; Diabetic peripheral neuropathy ([place]); Vitamin D insufficiency; Neurogenic pain; Diabetes mellitus due to underlying condition, uncontrolled, with diabetic neuropathy ([place]); Type 2 diabetes mellitus with diabetic polyneuropathy, with long-term current use of insulin ([place]); and Diabetic polyneuropathy associated with type 2 diabetes mellitus ([place]) on their problem list. His primarily concern today is the No chief complaint on file.  Pain Assessment: Location:      Radiating:   Onset:   Duration:   Quality:   Severity:  /10 (subjective, self-reported pain score)  Note: Reported level is compatible with observation.                         When using our objective Pain Scale, levels between 6 and 10/10 are said to belong in an emergency room, as it progressively worsens from a 6/10, described as severely limiting, requiring emergency care not usually available at an outpatient pain management facility. At a 6/10 level, communication becomes difficult and requires great effort. Assistance to reach the emergency department may be required. Facial flushing and profuse sweating along with potentially dangerous increases in heart rate and blood pressure will be evident. Effect on ADL:   Timing:   Modifying factors:   BP:    HR:    Mr. Hank comes in today for a follow-up visit after his initial evaluation on 02/25/2018. Today we went over the results of his tests. These were explained in "Layman's terms". During today's appointment we went over my diagnostic impression, as well as the proposed treatment plan.  According to the patient his primary area of pain is in his feet.  He admits the left greater than the right.  He admits that he does have burning and numbness and tingling. He admits that he fractured his left foot approximately 3 years ago and has had increased complications since then.  He is status post several foot surgeries with Dr. Caryl Comes.  He has had osteomyelitis.  He is status post amputation of fifth toe on his left foot and amputation of right foot great toe and fifth toe . He does have diabetes and neuropathy.  He is currently being seen at the wound clinic for decreased  healing.  He has had recent images.  In considering the treatment plan options, [name] was reminded that I no longer take patients for medication management only. I asked him to let me know if he had no intention of taking advantage of the interventional therapies, so that we  could make arrangements to provide this space to someone interested. I also made it clear that undergoing interventional therapies for the purpose of getting pain medications is very inappropriate on the part of a patient, and it will not be tolerated in this practice. This type of behavior would suggest true addiction and therefore it requires referral to an addiction specialist.   Further details on both, my assessment(s), as well as the proposed treatment plan, please see below.  Controlled Substance Pharmacotherapy Assessment REMS (Risk Evaluation and Mitigation Strategy)  Analgesic: none Highest recorded MME/day: 75 mg/day MME/day: 0 mg/day Pill Count: None expected due to no prior prescriptions written by our practice. No notes on file Pharmacokinetics: Liberation and absorption (onset of action): WNL Distribution (time to peak effect): WNL Metabolism and excretion (duration of action): WNL         Pharmacodynamics: Desired effects: Analgesia: [name] reports >50% benefit. Functional ability: Patient reports that medication allows him to accomplish basic ADLs Clinically meaningful improvement in function (CMIF): Sustained CMIF goals met Perceived effectiveness: Described as relatively effective, allowing for increase in activities of daily living (ADL) Undesirable effects: Side-effects or Adverse reactions: None reported Monitoring: [place] PMP: Online review of the past 72-monthperiod previously conducted. Not applicable at this point since we have not taken over the patient's medication management yet. List of other Serum/Urine Drug Screening Test(s):  No results found for: AMPHSCRSER, BARBSCRSER, BENZOSCRSER, COCAINSCRSER, COCAINSCRNUR, PCPSCRSER, THCSCRSER, THCU, [place] EMountrailList of all UDS test(s) done:  Lab Results  Component Value Date   SUMMARY FINAL 02/10/2018   Last UDS on record: Summary  Date Value Ref Range Status   02/10/2018 FINAL  Final    Comment:    ==================================================================== TOXASSURE COMP DRUG ANALYSIS,UR ==================================================================== Test                             Result       Flag       Units Drug Present not Declared for Prescription Verification   Alcohol, Ethyl                 0.281        UNEXPECTED g/dL    Sources of ethyl alcohol include alcoholic beverages or as a    fermentation product of glucose; glucose is present in this    specimen.  Interpret result with caution, as the presence of    ethyl alcohol is likely due, at least in part, to fermentation of    glucose. Drug Absent but Declared for Prescription Verification   Salicylate                     Not Detected UNEXPECTED    Aspirin, as indicated in the declared medication list, is not    always detected even when used as directed. ==================================================================== Test                      Result    Flag   Units      Ref Range   Creatinine  99               mg/dL      >=20 ==================================================================== Declared Medications:  The flagging and interpretation on this report are based on the  following declared medications.  Unexpected results may arise from  inaccuracies in the declared medications.  **Note: The testing scope of this panel does not include small to  moderate amounts of these reported medications:  Aspirin (Aspirin 81)  **Note: The testing scope of this panel does not include following  reported medications:  Dapagliflozin [place])  Insulin (Humalog)  Levofloxacin (Levaquin)  Linagliptin (Tradjenta)  Losartan (Cozaar)  Metformin ==================================================================== For clinical consultation, please call [phone]. ====================================================================    UDS  interpretation: No unexpected findings.          Medication Assessment Form: Patient introduced to form today Treatment compliance: Treatment may start today if patient agrees with proposed plan. Evaluation of compliance is not applicable at this point Risk Assessment Profile: Aberrant behavior: See initial evaluations. None observed or detected today Comorbid factors increasing risk of overdose: See initial evaluation. No additional risks detected today Opioid risk tool (ORT) (Total Score):   Personal History of Substance Abuse (SUD-Substance use disorder):  Alcohol:    Illegal Drugs:    Rx Drugs:    ORT Risk Level calculation:   Risk of substance use disorder (SUD): Low  ORT Scoring interpretation table:  Score <3 = Low Risk for SUD  Score between 4-7 = Moderate Risk for SUD  Score >8 = High Risk for Opioid Abuse   Risk Mitigation Strategies:  Patient opioid safety counseling: Completed today. Counseling provided to patient as per "Patient Counseling Document". Document signed by patient, attesting to counseling and understanding Patient-Prescriber Agreement (PPA): Obtained today.  Controlled substance notification to other providers: Written and sent today.  Pharmacologic Plan: Today we may be taking over the patient's pharmacological regimen. See below.             Laboratory Chemistry  Inflammation Markers (CRP: Acute Phase) (ESR: Chronic Phase) Lab Results  Component Value Date   CRP 6 02/10/2018   ESRSEDRATE 51 (H) 02/10/2018   LATICACIDVEN 1.3 01/08/2018                         Renal Function Markers Lab Results  Component Value Date   BUN 13 02/10/2018   CREATININE 0.96 02/10/2018   BCR 14 02/10/2018   GFRAA 102 02/10/2018   GFRNONAA 88 02/10/2018                             Hepatic Function Markers Lab Results  Component Value Date   AST 11 02/10/2018   ALT 22 01/08/2018   ALBUMIN 4.1 02/10/2018   ALKPHOS 110 02/10/2018                         Electrolytes Lab Results  Component Value Date   NA 139 02/10/2018   K 4.6 02/10/2018   CL 99 02/10/2018   CALCIUM 9.7 02/10/2018   MG 2.1 02/10/2018                        Neuropathy Markers Lab Results  Component Value Date   VITAMINB12 465 02/10/2018   HGBA1C 8.8 (H) 11/29/2017   HIV Non Reactive 03/06/2017  CNS Tests Lab Results  Component Value Date   SDES BLOOD BLOOD RIGHT FOREARM 01/08/2018   GRAMSTAIN  11/30/2017    RARE WBC PRESENT, PREDOMINANTLY PMN NO ORGANISMS SEEN    CULT  01/08/2018    NO GROWTH 5 DAYS Performed at [facility], [address], [place], [place] [address]                         Bone Pathology Markers Lab Results  Component Value Date   25OHVITD1 25 (L) 02/10/2018   25OHVITD2 <1.0 02/10/2018   25OHVITD3 25 02/10/2018                         Coagulation Parameters Lab Results  Component Value Date   INR 1.05 01/08/2018   LABPROT 13.6 01/08/2018   PLT 273 01/08/2018                        Cardiovascular Markers Lab Results  Component Value Date   CKTOTAL 129 11/09/2011   CKMB 0.6 11/09/2011   TROPONINI < 0.02 11/09/2011   HGB 14.3 01/08/2018   HCT 41.5 01/08/2018                         Note: Lab results reviewed.  Recent Diagnostic Imaging Review  Lumbosacral Imaging: Lumbar DG (Complete) 4+V:  Results for orders placed during the hospital encounter of 05/23/15  DG Lumbar Spine Complete   Narrative CLINICAL DATA:  Progressive lumbago without radicular symptoms.  EXAM: LUMBAR SPINE - COMPLETE 4+ VIEW  COMPARISON:  None.  FINDINGS: Frontal, lateral, spot lumbosacral lateral, and bilateral oblique views were obtained. There are 5 non-rib-bearing lumbar type vertebral bodies. The S1 vertebra is transitional with assimilation joints bilaterally at S1-2 and a disc space at S1-2 noted. There is no fracture or spondylolisthesis. Disc spaces appear intact. There is no appreciable  facet arthropathy. There are foci of arterial vascular calcification in the pelvis.  IMPRESSION: No fracture or spondylolisthesis. No appreciable arthropathic change. Note transitional S1 vertebra. Areas of atherosclerotic vascular calcification in the pelvis.   Electronically Signed   By: Lowella Grip III M.D.   On: 05/23/2015 11:42    Foot Imaging: Foot-R DG Complete:  Results for orders placed during the hospital encounter of 06/26/15  DG Foot Complete Right   Narrative CLINICAL DATA:  The right foot ulcer at the base of great toe  EXAM: RIGHT FOOT COMPLETE - 3+ VIEW  COMPARISON:  10/26/2012  FINDINGS: Three views of the right foot submitted. No acute fracture or subluxation. The patient is status post amputation of distal phalanx right great toe. Mild soft tissue swelling plantar aspect at the level of first metatarsal phalangeal joint. There is soft tissue defect at this probable wound noted on lateral view. There is no evidence of bony erosion or bone destruction to suggest osteomyelitis.  IMPRESSION: No acute fracture or subluxation. Probable tissue wound plantar aspect in the region of first metatarsal phalangeal joint. No evidence of osteomyelitis. Status post amputation of distal phalanx great toe.   Electronically Signed   By: Lahoma Crocker M.D.   On: 06/26/2015 08:17    Foot-L DG Complete:  Results for orders placed during the hospital encounter of 11/28/17  DG Foot Complete Left   Narrative CLINICAL DATA:  Diabetic foot ulcer  EXAM: LEFT FOOT - COMPLETE 3+ VIEW  COMPARISON:  None.  FINDINGS: No fracture or dislocation is seen.  The joint spaces are preserved.  Mild cortical thickening/irregularity involving the base of the 5th metatarsal, chronic. No definite erosions.  Overlying soft tissue swelling/ulceration.  IMPRESSION: Soft tissue swelling/ulceration along the lateral base of the 5th metatarsal.  No definite radiographic  findings to suggest acute osteomyelitis.   Electronically Signed   By: Julian Hy M.D.   On: 11/28/2017 21:56    Complexity Note: Imaging results reviewed. Results shared with Mr. Pullara, using Layman's terms.                         Meds   Current Outpatient Medications:  .  aspirin EC 81 MG tablet, Take 81 mg by mouth daily., Disp: , Rfl:  .  FARXIGA 10 MG TABS tablet, Take 1 tablet by mouth daily., Disp: , Rfl:  .  HUMALOG KWIKPEN 100 UNIT/ML KiwkPen, Inject 0-15 Units into the skin 3 (three) times daily. MAX-45 units a day, Disp: , Rfl:  .  Insulin Glargine (BASAGLAR KWIKPEN) 100 UNIT/ML SOPN, Inject 0.35 mLs (35 Units total) into the skin at bedtime., Disp: 30 pen, Rfl: 3 .  levofloxacin (LEVAQUIN) 500 MG tablet, Take 1 tablet (500 mg total) by mouth daily., Disp: 30 tablet, Rfl: 0 .  losartan (COZAAR) 50 MG tablet, Take 50 mg by mouth daily., Disp: , Rfl:  .  metFORMIN (GLUCOPHAGE) 1000 MG tablet, Take 1,000 mg by mouth 2 (two) times daily., Disp: , Rfl:  .  TRADJENTA 5 MG TABS tablet, Take 1 tablet by mouth daily., Disp: , Rfl:  No current facility-administered medications for this visit.   Facility-Administered Medications Ordered in Other Visits:  .  Tdap (BOOSTRIX) injection 0.5 mL, 0.5 mL, Intramuscular, Once, Betancourt, Tina A, NP  ROS  Constitutional: Denies any fever or chills Gastrointestinal: No reported hemesis, hematochezia, vomiting, or acute GI distress Musculoskeletal: Denies any acute onset joint swelling, redness, loss of ROM, or weakness Neurological: No reported episodes of acute onset apraxia, aphasia, dysarthria, agnosia, amnesia, paralysis, loss of coordination, or loss of consciousness  Allergies  Mr. Meininger is allergic to bactrim [sulfamethoxazole-trimethoprim].  PFSH  Drug: Mr. Jarboe  reports that he has current or past drug history. Drugs: Marijuana and Cocaine. Alcohol:  reports that he drinks alcohol. Tobacco:  reports that he has never  smoked. He has never used smokeless tobacco. Medical:  has a past medical history of Arthritis, Bacterial infection due to Morganella morganii (12/26/2017), BPH (benign prostatic hypertrophy), Diabetes mellitus, Diabetic neuropathy ([place]), Hypertension, Metatarsal bone fracture, Osteomyelitis of toe of right foot ([place]), Peripheral vascular disease ([place]), Post-operative infection, Sepsis due to Streptococcus, group B ([place]) (12/26/2017), Sleep apnea, and Wears glasses. Surgical: Mr. Eline  has a past surgical history that includes Patella reconstruction (Left, 2005); Prostate ablation (2014); Toe amputation; ORIF toe fracture (Left, 06/08/2015); Colonoscopy w/ biopsies and polypectomy; I&D extremity (Left, 01/11/2016); Wound debridement (Bilateral, 11/30/2017); Lower Extremity Angiography (N/A, 12/01/2017); Lower Extremity Angiography (Right, 12/04/2017); and Amputation toe (Left). Family: family history includes Diabetes in his mother and other.  Constitutional Exam  General appearance: Well nourished, well developed, and well hydrated. In no apparent acute distress There were no vitals filed for this visit. BMI Assessment: Estimated body mass index is 36.01 kg/m as calculated from the following:   Height as of 03/10/18: '5\' 10"'$  (1.778 m).   Weight as of 03/10/18: 251 lb (113.9 kg).  BMI interpretation table: BMI level  Category Range association with higher incidence of chronic pain  <18 kg/m2 Underweight   18.5-24.9 kg/m2 Ideal body weight   25-29.9 kg/m2 Overweight Increased incidence by 20%  30-34.9 kg/m2 Obese (Class I) Increased incidence by 68%  35-39.9 kg/m2 Severe obesity (Class II) Increased incidence by 136%  >40 kg/m2 Extreme obesity (Class III) Increased incidence by 254%   Patient's current BMI Ideal Body weight  There is no height or weight on file to calculate BMI. Ideal body weight: 73 kg (160 lb 15 oz) Adjusted ideal body weight: 89.3 kg (196 lb 15.4 oz)   BMI Readings from Last 4  Encounters:  03/10/18 36.01 kg/m  02/24/18 35.67 kg/m  02/10/18 34.72 kg/m  01/23/18 34.29 kg/m   Wt Readings from Last 4 Encounters:  03/10/18 251 lb (113.9 kg)  02/24/18 248 lb 9.6 oz (112.8 kg)  02/10/18 242 lb (109.8 kg)  01/23/18 239 lb (108.4 kg)  Psych/Mental status: Alert, oriented x 3 (person, place, & time)       Eyes: PERLA Respiratory: No evidence of acute respiratory distress  Cervical Spine Area Exam  Skin & Axial Inspection: No masses, redness, edema, swelling, or associated skin lesions Alignment: Symmetrical Functional ROM: Unrestricted ROM      Stability: No instability detected Muscle Tone/Strength: Functionally intact. No obvious neuro-muscular anomalies detected. Sensory (Neurological): Unimpaired Palpation: No palpable anomalies              Upper Extremity (UE) Exam    Side: Right upper extremity  Side: Left upper extremity  Skin & Extremity Inspection: Skin color, temperature, and hair growth are WNL. No peripheral edema or cyanosis. No masses, redness, swelling, asymmetry, or associated skin lesions. No contractures.  Skin & Extremity Inspection: Skin color, temperature, and hair growth are WNL. No peripheral edema or cyanosis. No masses, redness, swelling, asymmetry, or associated skin lesions. No contractures.  Functional ROM: Unrestricted ROM          Functional ROM: Unrestricted ROM          Muscle Tone/Strength: Functionally intact. No obvious neuro-muscular anomalies detected.  Muscle Tone/Strength: Functionally intact. No obvious neuro-muscular anomalies detected.  Sensory (Neurological): Unimpaired          Sensory (Neurological): Unimpaired          Palpation: No palpable anomalies              Palpation: No palpable anomalies              Provocative Test(s):  Phalen's test: deferred Tinel's test: deferred Apley's scratch test (touch opposite shoulder):  Action 1 (Across chest): deferred Action 2 (Overhead): deferred Action 3 (LB reach):  deferred   Provocative Test(s):  Phalen's test: deferred Tinel's test: deferred Apley's scratch test (touch opposite shoulder):  Action 1 (Across chest): deferred Action 2 (Overhead): deferred Action 3 (LB reach): deferred    Thoracic Spine Area Exam  Skin & Axial Inspection: No masses, redness, or swelling Alignment: Symmetrical Functional ROM: Unrestricted ROM Stability: No instability detected Muscle Tone/Strength: Functionally intact. No obvious neuro-muscular anomalies detected. Sensory (Neurological): Unimpaired Muscle strength & Tone: No palpable anomalies  Lumbar Spine Area Exam  Skin & Axial Inspection: No masses, redness, or swelling Alignment: Symmetrical Functional ROM: Unrestricted ROM       Stability: No instability detected Muscle Tone/Strength: Functionally intact. No obvious neuro-muscular anomalies detected. Sensory (Neurological): Unimpaired Palpation: No palpable anomalies       Provocative Tests: Hyperextension/rotation test: deferred today  Lumbar quadrant test (Kemp's test): deferred today       Lateral bending test: deferred today       Patrick's Maneuver: deferred today                   FABER test: deferred today                   S-I anterior distraction/compression test: deferred today         S-I lateral compression test: deferred today         S-I Thigh-thrust test: deferred today         S-I Gaenslen's test: deferred today          Gait & Posture Assessment  Ambulation: Unassisted Gait: Relatively normal for age and body habitus Posture: WNL   Lower Extremity Exam    Side: Right lower extremity  Side: Left lower extremity  Stability: No instability observed          Stability: No instability observed          Skin & Extremity Inspection: Skin color, temperature, and hair growth are WNL. No peripheral edema or cyanosis. No masses, redness, swelling, asymmetry, or associated skin lesions. No contractures.  Skin & Extremity Inspection:  Skin color, temperature, and hair growth are WNL. No peripheral edema or cyanosis. No masses, redness, swelling, asymmetry, or associated skin lesions. No contractures.  Functional ROM: Unrestricted ROM                  Functional ROM: Unrestricted ROM                  Muscle Tone/Strength: Functionally intact. No obvious neuro-muscular anomalies detected.  Muscle Tone/Strength: Functionally intact. No obvious neuro-muscular anomalies detected.  Sensory (Neurological): Unimpaired  Sensory (Neurological): Unimpaired  Palpation: No palpable anomalies  Palpation: No palpable anomalies   Assessment & Plan  Primary Diagnosis & Pertinent Problem List: The primary encounter diagnosis was Chronic pain syndrome. Diagnoses of Chronic foot pain (Primary Area of Pain) (Bilateral) (L>R), Chronic lower extremity pain (Secondary Area of Pain) (Bilateral) (L>R), Diabetes mellitus due to underlying condition, uncontrolled, with diabetic neuropathy ([place]), Type 2 diabetes mellitus with diabetic polyneuropathy, with long-term current use of insulin ([place]), Diabetic peripheral neuropathy ([place]), and Diabetic polyneuropathy associated with type 2 diabetes mellitus ([place]) were also pertinent to this visit.  Visit Diagnosis: 1. Chronic pain syndrome   2. Chronic foot pain (Primary Area of Pain) (Bilateral) (L>R)   3. Chronic lower extremity pain (Secondary Area of Pain) (Bilateral) (L>R)   4. Diabetes mellitus due to underlying condition, uncontrolled, with diabetic neuropathy ([place])   5. Type 2 diabetes mellitus with diabetic polyneuropathy, with long-term current use of insulin ([place])   6. Diabetic peripheral neuropathy ([place])   7. Diabetic polyneuropathy associated with type 2 diabetes mellitus ([place])    Problems updated and reviewed during this visit: Problem  Diabetes Mellitus Due to Underlying Condition, Uncontrolled, With Diabetic Neuropathy ([facility])  Type 2 Diabetes Mellitus With Diabetic Polyneuropathy, With  Long-Term Current Use of Insulin ([facility])  Diabetic Polyneuropathy Associated With Type 2 Diabetes Mellitus ([facility])    Plan of Care  Pharmacotherapy (Medications Ordered): No orders of the defined types were placed in this encounter.  Procedure Orders    No procedure(s) ordered today   Lab Orders  No laboratory test(s) ordered today   Imaging Orders  No imaging studies ordered today   Referral Orders  No referral(s) requested today  Pharmacological management options:  Opioid Analgesics: We'll take over management today. See above orders Membrane stabilizer: We have discussed the possibility of optimizing this mode of therapy, if tolerated Muscle relaxant: We have discussed the possibility of a trial NSAID: We have discussed the possibility of a trial Other analgesic(s): To be determined at a later time   Interventional management options: Planned, scheduled, and/or pending:    ***   Considering:   Diagnostic bilateral lumbar sympathetic block  Possible bilateral lumbar sympathetic RFA    PRN Procedures:   None at this time   Provider-requested follow-up: No follow-ups on file.  Future Appointments  Date Time Provider [place]  03/11/2018  7:45 AM Milinda Pointer, MD [facility] None    Primary Care Physician: Lorelee Market, MD Location: [facility] Note by: Gaspar Cola, MD Date: 03/11/2018; Time: 2:39 PM

## 2018-03-10 ENCOUNTER — Encounter
Admission: RE | Admit: 2018-03-10 | Discharge: 2018-03-10 | Disposition: A | Discharge status: Home / Self Care | Payer: BLUE CROSS/BLUE SHIELD | Source: Ambulatory Visit | Attending: Vascular Surgery | Admitting: Vascular Surgery

## 2018-03-10 DIAGNOSIS — E1142 Type 2 diabetes mellitus with diabetic polyneuropathy: Secondary | ICD-10-CM | POA: Insufficient documentation

## 2018-03-10 DIAGNOSIS — IMO0002 Reserved for concepts with insufficient information to code with codable children: Secondary | ICD-10-CM | POA: Insufficient documentation

## 2018-03-10 DIAGNOSIS — E0865 Diabetes mellitus due to underlying condition with hyperglycemia: Secondary | ICD-10-CM

## 2018-03-10 DIAGNOSIS — Z794 Long term (current) use of insulin: Secondary | ICD-10-CM | POA: Insufficient documentation

## 2018-03-10 DIAGNOSIS — E0840 Diabetes mellitus due to underlying condition with diabetic neuropathy, unspecified: Secondary | ICD-10-CM | POA: Insufficient documentation

## 2018-03-10 HISTORY — DX: Peripheral vascular disease, unspecified: I73.9

## 2018-03-11 ENCOUNTER — Encounter: Payer: BLUE CROSS/BLUE SHIELD | Admitting: Pain Medicine

## 2018-03-11 MED ORDER — DEXTROSE 5 % IV SOLN
2.0000 g | Freq: Once | INTRAVENOUS | Status: AC
  Administered 2018-03-12: 2 g via INTRAVENOUS
  Filled 2018-03-11: qty 20

## 2018-03-12 ENCOUNTER — Encounter
Admission: RE | Disposition: A | Discharge status: Home / Self Care | Payer: Self-pay | Source: Ambulatory Visit | Attending: Vascular Surgery

## 2018-03-12 ENCOUNTER — Ambulatory Visit
Admission: RE | Admit: 2018-03-12 | Discharge: 2018-03-12 | Disposition: A | Discharge status: Home / Self Care | Payer: BLUE CROSS/BLUE SHIELD | Source: Ambulatory Visit | Attending: Vascular Surgery | Admitting: Vascular Surgery

## 2018-03-12 DIAGNOSIS — Z955 Presence of coronary angioplasty implant and graft: Secondary | ICD-10-CM | POA: Diagnosis not present

## 2018-03-12 DIAGNOSIS — F1411 Cocaine abuse, in remission: Secondary | ICD-10-CM | POA: Insufficient documentation

## 2018-03-12 DIAGNOSIS — G4730 Sleep apnea, unspecified: Secondary | ICD-10-CM | POA: Diagnosis not present

## 2018-03-12 DIAGNOSIS — Z89411 Acquired absence of right great toe: Secondary | ICD-10-CM | POA: Insufficient documentation

## 2018-03-12 DIAGNOSIS — Z794 Long term (current) use of insulin: Secondary | ICD-10-CM | POA: Diagnosis not present

## 2018-03-12 DIAGNOSIS — E785 Hyperlipidemia, unspecified: Secondary | ICD-10-CM | POA: Insufficient documentation

## 2018-03-12 DIAGNOSIS — I70248 Atherosclerosis of native arteries of left leg with ulceration of other part of lower left leg: Secondary | ICD-10-CM

## 2018-03-12 DIAGNOSIS — Z7982 Long term (current) use of aspirin: Secondary | ICD-10-CM | POA: Diagnosis not present

## 2018-03-12 DIAGNOSIS — Z8619 Personal history of other infectious and parasitic diseases: Secondary | ICD-10-CM | POA: Diagnosis not present

## 2018-03-12 DIAGNOSIS — E11621 Type 2 diabetes mellitus with foot ulcer: Secondary | ICD-10-CM | POA: Insufficient documentation

## 2018-03-12 DIAGNOSIS — Z9889 Other specified postprocedural states: Secondary | ICD-10-CM | POA: Insufficient documentation

## 2018-03-12 DIAGNOSIS — Z881 Allergy status to other antibiotic agents status: Secondary | ICD-10-CM | POA: Diagnosis not present

## 2018-03-12 DIAGNOSIS — E1140 Type 2 diabetes mellitus with diabetic neuropathy, unspecified: Secondary | ICD-10-CM | POA: Diagnosis not present

## 2018-03-12 DIAGNOSIS — I739 Peripheral vascular disease, unspecified: Secondary | ICD-10-CM

## 2018-03-12 DIAGNOSIS — Z833 Family history of diabetes mellitus: Secondary | ICD-10-CM | POA: Diagnosis not present

## 2018-03-12 DIAGNOSIS — Z79899 Other long term (current) drug therapy: Secondary | ICD-10-CM | POA: Insufficient documentation

## 2018-03-12 DIAGNOSIS — F1211 Cannabis abuse, in remission: Secondary | ICD-10-CM | POA: Diagnosis not present

## 2018-03-12 DIAGNOSIS — I70245 Atherosclerosis of native arteries of left leg with ulceration of other part of foot: Secondary | ICD-10-CM | POA: Insufficient documentation

## 2018-03-12 DIAGNOSIS — L97529 Non-pressure chronic ulcer of other part of left foot with unspecified severity: Secondary | ICD-10-CM | POA: Insufficient documentation

## 2018-03-12 DIAGNOSIS — N400 Enlarged prostate without lower urinary tract symptoms: Secondary | ICD-10-CM | POA: Diagnosis not present

## 2018-03-12 HISTORY — PX: LOWER EXTREMITY ANGIOGRAPHY: CATH118251

## 2018-03-12 LAB — GLUCOSE, CAPILLARY
GLUCOSE-CAPILLARY: 168 mg/dL — AB (ref 70–99)
Glucose-Capillary: 190 mg/dL — ABNORMAL HIGH (ref 70–99)

## 2018-03-12 SURGERY — LOWER EXTREMITY ANGIOGRAPHY
Anesthesia: Moderate Sedation | Laterality: Left

## 2018-03-12 MED ORDER — SODIUM CHLORIDE 0.9 % IV SOLN: 250.0000 mL | INTRAVENOUS | Status: DC | PRN

## 2018-03-12 MED ORDER — HYDRALAZINE HCL 20 MG/ML IJ SOLN
INTRAMUSCULAR | Status: AC
  Administered 2018-03-12: 5 mg via INTRAVENOUS
  Filled 2018-03-12: qty 1

## 2018-03-12 MED ORDER — CLOPIDOGREL BISULFATE 75 MG PO TABS: 75.0000 mg | ORAL_TABLET | Freq: Every day | ORAL | 11 refills | Status: DC

## 2018-03-12 MED ORDER — ONDANSETRON HCL 4 MG/2ML IJ SOLN: 4.0000 mg | Freq: Four times a day (QID) | INTRAMUSCULAR | Status: DC | PRN

## 2018-03-12 MED ORDER — SODIUM CHLORIDE 0.9% FLUSH: 3.0000 mL | INTRAVENOUS | Status: DC | PRN

## 2018-03-12 MED ORDER — HEPARIN SODIUM (PORCINE) 1000 UNIT/ML IJ SOLN
INTRAMUSCULAR | Status: AC
  Filled 2018-03-12: qty 1

## 2018-03-12 MED ORDER — LABETALOL HCL 5 MG/ML IV SOLN
INTRAVENOUS | Status: AC
  Administered 2018-03-12: 10 mg via INTRAVENOUS
  Filled 2018-03-12: qty 4

## 2018-03-12 MED ORDER — LABETALOL HCL 5 MG/ML IV SOLN
10.0000 mg | INTRAVENOUS | Status: DC | PRN
  Administered 2018-03-12 (×2): 10 mg via INTRAVENOUS

## 2018-03-12 MED ORDER — SODIUM CHLORIDE 0.9 % IV SOLN: INTRAVENOUS | Status: DC

## 2018-03-12 MED ORDER — MIDAZOLAM HCL 5 MG/5ML IJ SOLN
INTRAMUSCULAR | Status: AC
  Filled 2018-03-12: qty 5

## 2018-03-12 MED ORDER — FENTANYL CITRATE (PF) 100 MCG/2ML IJ SOLN
INTRAMUSCULAR | Status: AC
  Filled 2018-03-12: qty 2

## 2018-03-12 MED ORDER — SODIUM CHLORIDE 0.9 % IV SOLN
INTRAVENOUS | Status: DC
  Administered 2018-03-12: 08:00:00 via INTRAVENOUS

## 2018-03-12 MED ORDER — FENTANYL CITRATE (PF) 100 MCG/2ML IJ SOLN
INTRAMUSCULAR | Status: DC | PRN
  Administered 2018-03-12 (×3): 50 ug via INTRAVENOUS

## 2018-03-12 MED ORDER — HYDROMORPHONE HCL 1 MG/ML IJ SOLN: 1.0000 mg | Freq: Once | INTRAMUSCULAR | Status: DC | PRN

## 2018-03-12 MED ORDER — HYDRALAZINE HCL 20 MG/ML IJ SOLN
5.0000 mg | INTRAMUSCULAR | Status: DC | PRN
  Administered 2018-03-12: 5 mg via INTRAVENOUS

## 2018-03-12 MED ORDER — IOPAMIDOL (ISOVUE-300) INJECTION 61%
INTRAVENOUS | Status: DC | PRN
  Administered 2018-03-12: 45 mL via INTRA_ARTERIAL

## 2018-03-12 MED ORDER — LIDOCAINE-EPINEPHRINE (PF) 1 %-1:200000 IJ SOLN
INTRAMUSCULAR | Status: AC
  Filled 2018-03-12: qty 30

## 2018-03-12 MED ORDER — SODIUM CHLORIDE 0.9% FLUSH: 3.0000 mL | Freq: Two times a day (BID) | INTRAVENOUS | Status: DC

## 2018-03-12 MED ORDER — ATORVASTATIN CALCIUM 10 MG PO TABS
10.0000 mg | ORAL_TABLET | Freq: Every day | ORAL | Status: DC
  Filled 2018-03-12: qty 1

## 2018-03-12 MED ORDER — ATORVASTATIN CALCIUM 10 MG PO TABS: 10.0000 mg | ORAL_TABLET | Freq: Every day | ORAL | 11 refills | Status: DC

## 2018-03-12 MED ORDER — MIDAZOLAM HCL 2 MG/2ML IJ SOLN
INTRAMUSCULAR | Status: DC | PRN
  Administered 2018-03-12: 1 mg via INTRAVENOUS
  Administered 2018-03-12: 2 mg via INTRAVENOUS
  Administered 2018-03-12 (×2): 1 mg via INTRAVENOUS

## 2018-03-12 MED ORDER — HEPARIN SODIUM (PORCINE) 1000 UNIT/ML IJ SOLN
INTRAMUSCULAR | Status: DC | PRN
  Administered 2018-03-12: 4000 [IU] via INTRAVENOUS

## 2018-03-12 MED ORDER — SODIUM CHLORIDE 0.9 % IJ SOLN
INTRAMUSCULAR | Status: AC
  Filled 2018-03-12: qty 50

## 2018-03-12 MED ORDER — ACETAMINOPHEN 325 MG PO TABS: 650.0000 mg | ORAL_TABLET | ORAL | Status: DC | PRN

## 2018-03-12 MED ORDER — CLOPIDOGREL BISULFATE 75 MG PO TABS: 75.0000 mg | ORAL_TABLET | Freq: Every day | ORAL | Status: DC

## 2018-03-12 MED ORDER — NITROGLYCERIN 1 MG/10 ML FOR IR/CATH LAB
INTRA_ARTERIAL | Status: DC | PRN
  Administered 2018-03-12: 250 ug

## 2018-03-12 MED ORDER — HEPARIN (PORCINE) IN NACL 1000-0.9 UT/500ML-% IV SOLN
INTRAVENOUS | Status: AC
  Filled 2018-03-12: qty 1000

## 2018-03-12 SURGICAL SUPPLY — 19 items
BALLN LUTONIX 018 4X100X130 (BALLOONS) ×2
BALLN LUTONIX 018 4X80X130 (BALLOONS) ×2
BALLN ULTRVRSE 3X300X150 (BALLOONS) ×1
BALLN ULTRVRSE 3X300X150 OTW (BALLOONS) ×1
BALLOON LUTONIX 018 4X100X130 (BALLOONS) ×1 IMPLANT
BALLOON LUTONIX 018 4X80X130 (BALLOONS) ×1 IMPLANT
BALLOON ULTRVRSE 3X300X150 OTW (BALLOONS) ×1 IMPLANT
CATH BEACON 5 .035 65 RIM TIP (CATHETERS) ×2 IMPLANT
CATH BEACON 5 .038 100 VERT TP (CATHETERS) ×2 IMPLANT
DEVICE PRESTO INFLATION (MISCELLANEOUS) ×2 IMPLANT
DEVICE STARCLOSE SE CLOSURE (Vascular Products) ×2 IMPLANT
PACK ANGIOGRAPHY (CUSTOM PROCEDURE TRAY) ×2 IMPLANT
SHEATH BRITE TIP 5FRX11 (SHEATH) ×2 IMPLANT
SHEATH RAABE 6FRX70 (SHEATH) ×2 IMPLANT
SYR MEDRAD MARK V 150ML (SYRINGE) ×2 IMPLANT
TUBING CONTRAST HIGH PRESS 72 (TUBING) ×2 IMPLANT
WIRE G V18X300CM (WIRE) ×2 IMPLANT
WIRE J 3MM .035X145CM (WIRE) ×2 IMPLANT
WIRE MAGIC TORQUE 260C (WIRE) ×2 IMPLANT

## 2018-03-12 NOTE — Op Note (Signed)
[place] [facility]  Percutaneous Study/Intervention Procedural Note   Date of Surgery: 03/12/2018  Surgeon(s):DEW,JASON    Assistants:none  Pre-operative Diagnosis: PAD with ulceration left lower extremity  Post-operative diagnosis:  Same  Procedure(s) Performed:             1.  Ultrasound guidance for vascular access right femoral artery             2.  Catheter placement into left peroneal artery and left anterior tibial artery from right femoral approach             3.  Aortogram and selective left lower extremity angiogram             4.  Percutaneous transluminal angioplasty of left peroneal artery and tibioperoneal trunk with 4 mm diameter by 8 cm length Lutonix drug-coated angioplasty balloon             5.   Percutaneous transluminal angioplasty of left anterior tibial artery with 3 mm diameter by 30 cm length angioplasty balloon from the proximal segment down to the ankle and then a 4 mm diameter by 10 cm length Lutonix drug-coated angioplasty balloon for residual disease in the midsegment  6.  StarClose closure device right femoral artery  EBL: 5 cc  Contrast: 45 cc  Fluoro Time: 4.3 minutes  Moderate Conscious Sedation Time: approximately 35 minutes using 5 mg of Versed and 150 Mcg of Fentanyl              Indications:  Patient is a 57 y.o.male with continued ulceration of the left foot and healing of the right foot after bilateral lower extremity revascularizations.  His left leg remains a critical and limb threatening situation. The patient is brought in for angiography for further evaluation and potential treatment.  Due to the limb threatening nature of the situation, angiogram was performed for attempted limb salvage. The patient is aware that if the procedure fails, amputation would be expected.  The patient also understands that even with successful revascularization, amputation may still be required due to the severity of the situation.  Risks and  benefits are discussed and informed consent is obtained.   Procedure:  The patient was identified and appropriate procedural time out was performed.  The patient was then placed supine on the table and prepped and draped in the usual sterile fashion. Moderate conscious sedation was administered during a face to face encounter with the patient throughout the procedure with my supervision of the RN administering medicines and monitoring the patient's vital signs, pulse oximetry, telemetry and mental status throughout from the start of the procedure until the patient was taken to the recovery room. Ultrasound was used to evaluate the right common femoral artery.  It was patent .  A digital ultrasound image was acquired.  A Seldinger needle was used to access the right common femoral artery under direct ultrasound guidance and a permanent image was performed.  A 0.035 J wire was advanced without resistance and a 5Fr sheath was placed.   Aortogram was not performed as no recent intervention was required.  I then crossed the aortic bifurcation and advanced to the left femoral head. Selective left lower extremity angiogram was then performed. This demonstrated mild diffuse disease throughout the left SFA and popliteal arteries without greater than 30% stenosis.  The common femoral artery and profunda femoris artery are patent.  There was then a high-grade stenosis in the proximal portion of the peroneal artery and tibioperoneal  trunk of greater than 80%.  This vessel then normalized and was continuous distally.  There is a mild stenosis in the posterior tibial artery of what appeared to be less than 30% and this vessel was also continuous distally.  The anterior tibial artery had about a 70% stenosis in the mid segment with further stenosis in the mid to distal segments in the 60 to 70% range. The patient was systemically heparinized and a 6 French 70 cm sheath was then placed over the Magic torque wire. I then used a  Kumpe catheter and the Magic torque wire to navigate down into the peroneal artery beyond the stenosis and confirm intraluminal flow in the proximal to mid peroneal artery.  I then exchanged for a 0.018 wire and elected to proceed with intervention.  These were reasonably generous vessels, and a 4 mm diameter by 8 cm length Lutonix drug-coated angioplasty balloon was gently inflated to 6 atm for 1 minute and the tibioperoneal trunk and proximal peroneal arteries.  Completion imaging showed about a 20% residual stenosis in this location.  I then turned my attention to the anterior tibial artery.  Using the Kumpe catheter and the V 18 wire this was easily cannulated and intraluminal flow confirmed.  I then cross the stenoses without difficulty with the 0.018 wire and parked this in the foot.  A 3 mm diameter by 30 cm length angioplasty balloon was inflated from the ankle up to the proximal anterior tibial artery to encompass the lesions.  This was taken to 12 atm for 1 minute.  Completion imaging showed greater than 50% residual stenosis in the calcific lesion in the mid anterior tibial artery with less than 30% residual stenosis in the more proximal and distal lesions.  I then used a 4 mm diameter by 10 cm length Lutonix drug-coated angioplasty balloon and inflated this gently to 6 atm for 1 minute in the mid anterior tibial artery.  Completion imaging showed about a 30 to 35% residual stenosis and the patient now had three-vessel runoff to the foot. I elected to terminate the procedure. The sheath was removed and StarClose closure device was deployed in the right femoral artery with excellent hemostatic result. The patient was taken to the recovery room in stable condition having tolerated the procedure well.  Findings:                       Left Lower Extremity:  Mild diffuse disease throughout the left SFA and popliteal arteries without greater than 30% stenosis.  The common femoral artery and profunda femoris  artery are patent.  There was then a high-grade stenosis in the proximal portion of the peroneal artery and tibioperoneal trunk of greater than 80%.  This vessel then normalized and was continuous distally.  There is a mild stenosis in the posterior tibial artery of what appeared to be less than 30% and this vessel was also continuous distally.  The anterior tibial artery had about a 70% stenosis in the mid segment with further stenosis in the mid to distal segments in the 60 to 70% range.   Disposition: Patient was taken to the recovery room in stable condition having tolerated the procedure well.  Complications: None  Leotis Pain 03/12/2018 9:59 AM   This note was created with Dragon Medical transcription system. Any errors in dictation are purely unintentional.

## 2018-03-12 NOTE — H&P (Signed)
[place] [facility] History & Physical Update  The patient was interviewed and re-examined.  The patient's previous History and Physical has been reviewed and is unchanged.  There is no change in the plan of care. We plan to proceed with the scheduled procedure.  Leotis Pain, MD  03/12/2018, 8:49 AM

## 2018-03-15 DIAGNOSIS — Z7901 Long term (current) use of anticoagulants: Secondary | ICD-10-CM | POA: Insufficient documentation

## 2018-03-15 NOTE — Progress Notes (Signed)
Patient's Name: [name]  MRN: [id number]  Referring Provider: Lorelee Market, MD  DOB: May 06, 1961  PCP: [name] Market, MD  DOS: 03/16/2018  Note by: [name] Cola, MD  Service setting: Ambulatory outpatient  Specialty: Interventional Pain Management  Location: [facility] (AMB) Pain Management Facility    Patient type: Established   Primary Reason(s) for Visit: Encounter for evaluation before starting new chronic pain management plan of care (Level of risk: moderate) CC: Foot Pain (bilateral)  HPI  [name] is a 57 y.o. yearar old, male patient, who [name] today for a follow-up evaluation to review the test results and decide on a treatment plan. 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, with necrosis of muscle ([place]); Metatarsal stress fracture of left foot; Diabetic foot infection ([place]); Hardware complicating wound infection ([place]); Surgery, elective; Cellulitis of foot (Left); HTN (hypertension); HLD (hyperlipidemia); Osteomyelitis ([place]); Bacterial infection due to Morganella morganii; Sepsis due to Streptococcus, group B ([place]); Chronic foot pain (Primary Area of Pain) (Bilateral) (L>R); Chronic lower extremity pain (Secondary Area of Pain) (Bilateral) (L>R); Chronic pain syndrome; Long term current use of opiate analgesic; Pharmacologic therapy; Disorder of skeletal system; Problems influencing health status; Atherosclerosis of native arteries of the extremities with ulceration ([place]); Sleep apnea; Diabetic peripheral neuropathy ([place]); Vitamin D insufficiency; Neurogenic pain; Diabetes mellitus due to underlying condition, uncontrolled, with diabetic neuropathy ([place]); Type 2 diabetes mellitus with diabetic polyneuropathy, with long-term current use of insulin ([place]); Diabetic polyneuropathy associated with type 2 diabetes mellitus ([place]); and Chronic anticoagulationn (PLAVIX) on their problem list. His primarily concern today is the Foot Pain (bilateral)  Pain  Assessment: Location: Right, Left Foot Radiating: denies Onset: More than a month ago Duration: Chronic pain Quality: Aching, Burning, Throbbing, Tingling, Stabbing, Nagging, Discomfort, Shooting Severity: 7 /10 (subjective, self-reported pain score)  Note: Reported level is inconsistent with clinical observations. Clinically the patient looks like a 3/10 A 3/10 is viewed as "Moderate" and described as significantly interfering with activities of daily living (ADL). It becomes difficult to feed, bathe, get dressed, get on and off the toilet or to perform personal hygiene functions. Difficult to get in and out of bed or a chair without assistance. Very distracting. With effort, it can be ignored when deeply involved in activities. Information on the proper use of the pain scale provided to the patient today. When using our objective Pain Scale, levels between 6 and 10/10 are said to belong in an emergency room, as it progressively worsens from a 6/10, described as severely limiting, requiring emergency care not usually available at an outpatient pain management facility. At a 6/10 level, communication becomes difficult and requires great effort. Assistance to reach the emergency department may be required. Facial flushing and profuse sweating along with potentially dangerous increases in heart rate and blood pressure will be evident. Timing: Intermittent Modifying factors: nothing BP: (!) 136/111(left 180/101)  HR: 85  [name] [name] in today for a follow-up visit after his initial evaluation on 02/25/2018. Today we went over the results of his tests. These were explained in "Layman's terms". During today's appointment we went over my diagnostic impression, as well as the proposed treatment plan.  In reviewing this patient's chart, we have that according to the patient his primary area of pain is in his feet.    He indicates that this pain is bilateral with the left-sided pain being greater than the  right.    He describes the symptoms as burning, numbness,  and tingling.  He believes that this started with a fractured of his left foot approximately 3 years ago.  The pain has increased and he has experienced complications since then.  He is status post several foot surgeries with [name].  He has had osteomyelitis.  He is status post amputation of fifth toe on his left foot and amputation of right foot great toe and fifth toe . He does have diabetes and neuropathy.  He is currently being seen at the wound clinic for decreased healing.  He refers having had recent images.  The patient [name] in indicating that he was sent over here for [place] to take over his medications and to see if he could get some stronger pain medicines.  In considering the treatment plan options, [name] was reminded that I no longer take patients for medication management only. I asked him to let me know if he had no intention of taking advantage of the interventional therapies, so that we could make arrangements to provide this space to someone interested. I also made it clear that undergoing interventional therapies for the purpose of getting pain medications is very inappropriate on the part of a patient, and it will not be tolerated in this practice. This type of behavior would suggest true addiction and therefore it requires referral to an addiction specialist.   Today I did go over the results of his blood work, x-rays, as well as the treatment plan that I can offer him.  This treatment plan included the diagnostic lumbar sympathetic blocks, possibly followed by radiofrequency ablation, should they prove to be effective in controlling some of his pain and increase in the blood flow to the lower extremity.  However, [name] was less interested in the interventional therapy and more interested in what we could offer him in terms of medications.  He does have some pathology that warrants the use of some analgesics, primarily  membrane stabilizers for the neuropathic pain, in combination with a low dose opioid analgesic.  I do understand how it would be beneficial for him to stay away from the nonsteroidal anti-inflammatory drugs, especially with the concomitant use of Plavix.  However in evaluating this patient there are some concerns about his personality and the way that he approaches taking medications.  When he was describing to me that he would try to control his pain by taking 3 Aleve's 3 times a day, occasionally using ibuprofen 600 mg tablets, 3 at a time, 3 times a day, he was very clear to me that these did not seem to be the typical instructions given by any healthcare provider to the patient on how to take medications.  This meant to me that the patient was dosing himself as he thought he needed, regardless of possible consequences.  Furthermore, in completing his case to me so that I would prescribe opioids, he had a thyroid the end slip of the tongue and mentioned that he has friends that get pain medication without need and it and they sell it and he could not understand why he cannot he get any for his real pain.  To me this means that he keeps a very poor company.  In any case, the patient indicated not being interested in the interventional therapies.  Further details on both, my assessment(s), as well as the proposed treatment plan, please see below.  Controlled Substance Pharmacotherapy Assessment REMS (Risk Evaluation and Mitigation Strategy)  Opioid Analgesic: none. Use to take 3 Aleve, or  3 Ibuprofen 600 mg, or 3 Tylenols TID. Also tried Neurontin 400 mg QD, but stopped because he did not see much benefit. No side-effects described. In the past took 2 Oxycodone at a time, but he was getting only #10 tablets at a time. Highest recorded MME/day: 75 mg/day MME/day: 0 mg/day Pill Count: None expected due to no prior prescriptions written by our practice. Hart Rochester, RN  03/16/2018  8:35 AM  Sign at  close encounter Safety precautions to be maintained throughout the outpatient stay will include: orient to surroundings, keep bed in low position, maintain call bell within reach at all times, provide assistance with transfer out of bed and ambulation.    Monitoring: [place] PMP: Online review of the past 72-monthperiod previously conducted. Not applicable at this point since we have not taken over the patient's medication management yet. List of other Serum/Urine Drug Screening Test(s):  No results found. List of all UDS test(s) done:  Lab Results  Component Value Date   SUMMARY FINAL 02/10/2018   Last UDS on record: Summary  Date Value Ref Range Status  02/10/2018 FINAL  Final    Comment:    ==================================================================== TOXASSURE COMP DRUG ANALYSIS,UR ==================================================================== Test                             Result       Flag       Units Drug Present not Declared for Prescription Verification   Alcohol, Ethyl                 0.281        UNEXPECTED g/dL    Sources of ethyl alcohol include alcoholic beverages or as a    fermentation product of glucose; glucose is present in this    specimen.  Interpret result with caution, as the presence of    ethyl alcohol is likely due, at least in part, to fermentation of    glucose. Drug Absent but Declared for Prescription Verification   Salicylate                     Not Detected UNEXPECTED    Aspirin, as indicated in the declared medication list, is not    always detected even when used as directed. ==================================================================== Test                      Result    Flag   Units      Ref Range   Creatinine              99               mg/dL      >=20 ==================================================================== Declared Medications:  The flagging and interpretation on this report are based on the  following  declared medications.  Unexpected results may arise from  inaccuracies in the declared medications.  **Note: The testing scope of this panel does not include small to  moderate amounts of these reported medications:  Aspirin (Aspirin 81)  **Note: The testing scope of this panel does not include following  reported medications:  Dapagliflozin ([place]  Insulin (Humalog)  Levofloxacin (Levaquin)  Linagliptin (Tradjenta)  Losartan (Cozaar)  Metformin ==================================================================== For clinical consultation, please call [phone] ====================================================================    UDS interpretation: Unexpected findings not considered significantly abnormal.          Medication Assessment Form: Not applicable.  No opioids. Treatment compliance: Not applicable Risk Assessment Profile: Aberrant behavior: aggressive complaining about need for higher doses or stronger medication, extensive time discussing medicaiton, taking more medication than prescribed and unsafe use of medication Comorbid factors increasing risk of overdose: See initial evaluation. No additional risks detected today Opioid risk tool (ORT) (Total Score): 3 ORT Risk Level calculation: Low Risk Risk of substance use disorder (SUD): Low-to-Moderate Opioid Risk Tool - 03/16/18 0842      Age   Age between 23-45 years   No      History of Preadolescent Sexual Abuse   History of Preadolescent Sexual Abuse  Negative or Male      Psychological Disease   Psychological Disease  Negative    Depression  Negative      Total Score   Opioid Risk Tool Scoring  0    Opioid Risk Interpretation  Low Risk      ORT Scoring interpretation table:  Score <3 = Low Risk for SUD  Score between 4-7 = Moderate Risk for SUD  Score >8 = High Risk for Opioid Abuse   Risk Mitigation Strategies:  Patient opioid safety counseling: No controlled substances  prescribed. Patient-Prescriber Agreement (PPA): No agreement signed.  Controlled substance notification to other providers: None required. No opioid therapy.  Pharmacologic Plan: The patient does have confirmed pathology for which the use of opioid analgesics may be justified. He is not interested interventional therapies, only on medications. Unfortunately, we do not have the necessary resources to take on his case for medication management only.  Laboratory Chemistry  Inflammation Markers (CRP: Acute Phase) (ESR: Chronic Phase) Lab Results  Component Value Date   CRP 6 02/10/2018   ESRSEDRATE 51 (H) 02/10/2018   LATICACIDVEN 1.3 01/08/2018                         Rheumatology Markers No results found.  Renal Function Markers Lab Results  Component Value Date   BUN 13 02/10/2018   CREATININE 0.96 02/10/2018   BCR 14 02/10/2018   GFRAA 102 02/10/2018   GFRNONAA 88 02/10/2018                             Hepatic Function Markers Lab Results  Component Value Date   AST 11 02/10/2018   ALT 22 01/08/2018   ALBUMIN 4.1 02/10/2018   ALKPHOS 110 02/10/2018                        Electrolytes Lab Results  Component Value Date   NA 139 02/10/2018   K 4.6 02/10/2018   CL 99 02/10/2018   CALCIUM 9.7 02/10/2018   MG 2.1 02/10/2018                        Neuropathy Markers Lab Results  Component Value Date   VITAMINB12 465 02/10/2018   HGBA1C 8.8 (H) 11/29/2017   HIV Non Reactive 03/06/2017                        CNS Tests No results found.  Bone Pathology Markers Lab Results  Component Value Date   25OHVITD1 25 (L) 02/10/2018   25OHVITD2 <1.0 02/10/2018   25OHVITD3 25 02/10/2018  Coagulation Parameters Lab Results  Component Value Date   INR 1.05 01/08/2018   LABPROT 13.6 01/08/2018   PLT 273 01/08/2018                        Cardiovascular Markers Lab Results  Component Value Date   CKTOTAL 129 11/09/2011   CKMB 0.6 11/09/2011    TROPONINI < 0.02 11/09/2011   HGB 14.3 01/08/2018   HCT 41.5 01/08/2018                         CA Markers No results found.  Note: Lab results reviewed.  Recent Diagnostic Imaging Review  Lumbosacral Imaging: Lumbar DG (Complete) 4+V:  Results for orders placed during the hospital encounter of 05/23/15  DG Lumbar Spine Complete   Narrative CLINICAL DATA:  Progressive lumbago without radicular symptoms.  EXAM: LUMBAR SPINE - COMPLETE 4+ VIEW  COMPARISON:  None.  FINDINGS: Frontal, lateral, spot lumbosacral lateral, and bilateral oblique views were obtained. There are 5 non-rib-bearing lumbar type vertebral bodies. The S1 vertebra is transitional with assimilation joints bilaterally at S1-2 and a disc space at S1-2 noted. There is no fracture or spondylolisthesis. Disc spaces appear intact. There is no appreciable facet arthropathy. There are foci of arterial vascular calcification in the pelvis.  IMPRESSION: No fracture or spondylolisthesis. No appreciable arthropathic change. Note transitional S1 vertebra. Areas of atherosclerotic vascular calcification in the pelvis.   Electronically Signed   By: Lowella Grip III M.D.   On: 05/23/2015 11:42    Foot Imaging: Foot-R DG Complete:  Results for orders placed during the hospital encounter of 06/26/15  DG Foot Complete Right   Narrative CLINICAL DATA:  The right foot ulcer at the base of great toe  EXAM: RIGHT FOOT COMPLETE - 3+ VIEW  COMPARISON:  10/26/2012  FINDINGS: Three views of the right foot submitted. No acute fracture or subluxation. The patient is status post amputation of distal phalanx right great toe. Mild soft tissue swelling plantar aspect at the level of first metatarsal phalangeal joint. There is soft tissue defect at this probable wound noted on lateral view. There is no evidence of bony erosion or bone destruction to suggest osteomyelitis.  IMPRESSION: No acute fracture or  subluxation. Probable tissue wound plantar aspect in the region of first metatarsal phalangeal joint. No evidence of osteomyelitis. Status post amputation of distal phalanx great toe.   Electronically Signed   By: Lahoma Crocker M.D.   On: 06/26/2015 08:17    Foot-L DG Complete:  Results for orders placed during the hospital encounter of 11/28/17  DG Foot Complete Left   Narrative CLINICAL DATA:  Diabetic foot ulcer  EXAM: LEFT FOOT - COMPLETE 3+ VIEW  COMPARISON:  None.  FINDINGS: No fracture or dislocation is seen.  The joint spaces are preserved.  Mild cortical thickening/irregularity involving the base of the 5th metatarsal, chronic. No definite erosions.  Overlying soft tissue swelling/ulceration.  IMPRESSION: Soft tissue swelling/ulceration along the lateral base of the 5th metatarsal.  No definite radiographic findings to suggest acute osteomyelitis.   Electronically Signed   By: Julian Hy M.D.   On: 11/28/2017 21:56    Complexity Note: Imaging results reviewed. Results shared with Mr. Melhorn, using Layman's terms.                         Meds   Current Outpatient Medications:  .  aspirin EC 81 MG tablet, Take 81 mg by mouth daily., Disp: , Rfl:  .  atorvastatin (LIPITOR) 10 MG tablet, Take 1 tablet (10 mg total) by mouth daily., Disp: 30 tablet, Rfl: 11 .  clopidogrel (PLAVIX) 75 MG tablet, Take 1 tablet (75 mg total) by mouth daily., Disp: 30 tablet, Rfl: 11 .  HUMALOG KWIKPEN 100 UNIT/ML KiwkPen, Inject 0-15 Units into the skin 3 (three) times daily. MAX-45 units a day, Disp: , Rfl:  .  Insulin Glargine (BASAGLAR KWIKPEN) 100 UNIT/ML SOPN, Inject 0.35 mLs (35 Units total) into the skin at bedtime., Disp: 30 pen, Rfl: 3 .  losartan (COZAAR) 50 MG tablet, Take 50 mg by mouth daily., Disp: , Rfl:  .  metFORMIN (GLUCOPHAGE) 1000 MG tablet, Take 1,000 mg by mouth 2 (two) times daily., Disp: , Rfl:  .  Calcium Carbonate-Vit D-Min (GNP CALCIUM 1200)  1200-1000 MG-UNIT CHEW, Chew 1,200 mg by mouth daily with breakfast. Take in combination with vitamin D and magnesium., Disp: 30 tablet, Rfl: 5 .  Cholecalciferol (VITAMIN D3) 5000 units CAPS, Take 1 capsule (5,000 Units total) by mouth daily with breakfast. Take along with calcium and magnesium., Disp: 30 capsule, Rfl: 5 .  ergocalciferol (VITAMIN D2) 50000 units capsule, Take 1 capsule (50,000 Units total) by mouth 2 (two) times a week. X 6 weeks., Disp: 12 capsule, Rfl: 0 .  Magnesium 500 MG CAPS, Take 1 capsule (500 mg total) by mouth 2 (two) times daily at 8 am and 10 pm., Disp: 60 capsule, Rfl: 5 No current facility-administered medications for this visit.   Facility-Administered Medications Ordered in Other Visits:  .  Tdap (BOOSTRIX) injection 0.5 mL, 0.5 mL, Intramuscular, Once, Betancourt, Tina A, NP  ROS  Constitutional: Denies any fever or chills Gastrointestinal: No reported hemesis, hematochezia, vomiting, or acute GI distress Musculoskeletal: Denies any acute onset joint swelling, redness, loss of ROM, or weakness Neurological: No reported episodes of acute onset apraxia, aphasia, dysarthria, agnosia, amnesia, paralysis, loss of coordination, or loss of consciousness  Allergies  Mr. Fluegel is allergic to bactrim [sulfamethoxazole-trimethoprim].  PFSH  Drug: Mr. Matney  reports that he has current or past drug history. Drugs: Marijuana and Cocaine. Alcohol:  reports that he drinks alcohol. Tobacco:  reports that he has never smoked. He has never used smokeless tobacco. Medical:  has a past medical history of Arthritis, Bacterial infection due to Morganella morganii (12/26/2017), BPH (benign prostatic hypertrophy), Diabetes mellitus, Diabetic neuropathy ([place]), Hypertension, Metatarsal bone fracture, Osteomyelitis of toe of right foot ([place]), Peripheral vascular disease ([place]), Post-operative infection, Sepsis due to Streptococcus, group B ([place]) (12/26/2017), Sleep apnea, and Wears  glasses. Surgical: Mr. Sperl  has a past surgical history that includes Patella reconstruction (Left, 2005); Prostate ablation (2014); Toe amputation; ORIF toe fracture (Left, 06/08/2015); Colonoscopy w/ biopsies and polypectomy; I&D extremity (Left, 01/11/2016); Wound debridement (Bilateral, 11/30/2017); Lower Extremity Angiography (N/A, 12/01/2017); Lower Extremity Angiography (Right, 12/04/2017); Amputation toe (Left); and Lower Extremity Angiography (Left, 03/12/2018). Family: family history includes Diabetes in his mother and other.  Constitutional Exam  General appearance: Well nourished, well developed, and well hydrated. In no apparent acute distress Vitals:   03/16/18 0835  BP: (!) 136/111  Pulse: 85  Resp: 16  Temp: 98.1 F (36.7 C)  TempSrc: Oral  SpO2: 98%  Weight: 249 lb (112.9 kg)  Height: 5' 10" (1.778 m)   BMI Assessment: Estimated body mass index is 35.73 kg/m as calculated from the following:   Height as of  this encounter: 5' 10" (1.778 m).   Weight as of this encounter: 249 lb (112.9 kg).  BMI interpretation table: BMI level Category Range association with higher incidence of chronic pain  <18 kg/m2 Underweight   18.5-24.9 kg/m2 Ideal body weight   25-29.9 kg/m2 Overweight Increased incidence by 20%  30-34.9 kg/m2 Obese (Class I) Increased incidence by 68%  35-39.9 kg/m2 Severe obesity (Class II) Increased incidence by 136%  >40 kg/m2 Extreme obesity (Class III) Increased incidence by 254%   Patient's current BMI Ideal Body weight  Body mass index is 35.73 kg/m. Ideal body weight: 73 kg (160 lb 15 oz) Adjusted ideal body weight: 89 kg (196 lb 2.6 oz)   BMI Readings from Last 4 Encounters:  03/16/18 35.73 kg/m  03/12/18 36.06 kg/m  03/10/18 36.01 kg/m  02/24/18 35.67 kg/m   Wt Readings from Last 4 Encounters:  03/16/18 249 lb (112.9 kg)  03/12/18 251 lb 5.2 oz (114 kg)  03/10/18 251 lb (113.9 kg)  02/24/18 248 lb 9.6 oz (112.8 kg)  Psych/Mental status:  Alert, oriented x 3 (person, place, & time)       Eyes: PERLA Respiratory: No evidence of acute respiratory distress  Cervical Spine Area Exam  Skin & Axial Inspection: No masses, redness, edema, swelling, or associated skin lesions Alignment: Symmetrical Functional ROM: Unrestricted ROM      Stability: No instability detected Muscle Tone/Strength: Functionally intact. No obvious neuro-muscular anomalies detected. Sensory (Neurological): Unimpaired Palpation: No palpable anomalies              Upper Extremity (UE) Exam    Side: Right upper extremity  Side: Left upper extremity  Skin & Extremity Inspection: Skin color, temperature, and hair growth are WNL. No peripheral edema or cyanosis. No masses, redness, swelling, asymmetry, or associated skin lesions. No contractures.  Skin & Extremity Inspection: Skin color, temperature, and hair growth are WNL. No peripheral edema or cyanosis. No masses, redness, swelling, asymmetry, or associated skin lesions. No contractures.  Functional ROM: Unrestricted ROM          Functional ROM: Unrestricted ROM          Muscle Tone/Strength: Functionally intact. No obvious neuro-muscular anomalies detected.  Muscle Tone/Strength: Functionally intact. No obvious neuro-muscular anomalies detected.  Sensory (Neurological): Unimpaired          Sensory (Neurological): Unimpaired          Palpation: No palpable anomalies              Palpation: No palpable anomalies              Provocative Test(s):  Phalen's test: deferred Tinel's test: deferred Apley's scratch test (touch opposite shoulder):  Action 1 (Across chest): deferred Action 2 (Overhead): deferred Action 3 (LB reach): deferred   Provocative Test(s):  Phalen's test: deferred Tinel's test: deferred Apley's scratch test (touch opposite shoulder):  Action 1 (Across chest): deferred Action 2 (Overhead): deferred Action 3 (LB reach): deferred    Thoracic Spine Area Exam  Skin & Axial Inspection: No  masses, redness, or swelling Alignment: Symmetrical Functional ROM: Unrestricted ROM Stability: No instability detected Muscle Tone/Strength: Functionally intact. No obvious neuro-muscular anomalies detected. Sensory (Neurological): Unimpaired Muscle strength & Tone: No palpable anomalies  Lumbar Spine Area Exam  Skin & Axial Inspection: No masses, redness, or swelling Alignment: Symmetrical Functional ROM: Unrestricted ROM       Stability: No instability detected Muscle Tone/Strength: Functionally intact. No obvious neuro-muscular anomalies detected. Sensory (Neurological): Unimpaired Palpation:  No palpable anomalies       Provocative Tests: Hyperextension/rotation test: deferred today       Lumbar quadrant test (Kemp's test): deferred today       Lateral bending test: deferred today       Patrick's Maneuver: deferred today                   FABER test: deferred today                   S-I anterior distraction/compression test: deferred today         S-I lateral compression test: deferred today         S-I Thigh-thrust test: deferred today         S-I Gaenslen's test: deferred today          Gait & Posture Assessment  Ambulation: Unassisted Gait: Relatively normal for age and body habitus Posture: WNL   Lower Extremity Exam    Side: Right lower extremity  Side: Left lower extremity  Stability: No instability observed          Stability: No instability observed          Skin & Extremity Inspection: Skin color, temperature, and hair growth are WNL. No peripheral edema or cyanosis. No masses, redness, swelling, asymmetry, or associated skin lesions. No contractures.  Skin & Extremity Inspection: The patient is currently wearing some specially designed shoes, in order to accommodate his foot ulcer.  Indicates that it is currently not infected.  However, he also indicated that he was recently given some additional antibiotics.  Functional ROM: Diminished ROM                   Functional ROM: Diminished ROM Ankle and foot          Muscle Tone/Strength: Functionally intact. No obvious neuro-muscular anomalies detected.  Muscle Tone/Strength: TEFL teacher (Neurological): Neuropathic pain pattern  Sensory (Neurological): Neuropathic pain pattern  Palpation: Complains of area being tender to palpation  Palpation: Complains of area being tender to palpation   Assessment & Plan  Primary Diagnosis & Pertinent Problem List: The primary encounter diagnosis was Chronic pain syndrome. Diagnoses of Chronic foot pain (Primary Area of Pain) (Bilateral) (L>R), Chronic lower extremity pain (Secondary Area of Pain) (Bilateral) (L>R), Diabetic peripheral neuropathy ([place]), Diabetes mellitus due to underlying condition, uncontrolled, with diabetic neuropathy ([place]), Diabetic polyneuropathy associated with type 2 diabetes mellitus ([place]), Neurogenic pain, Vitamin D insufficiency, and Chronic anticoagulationn (PLAVIX) were also pertinent to this visit.  Visit Diagnosis: 1. Chronic pain syndrome   2. Chronic foot pain (Primary Area of Pain) (Bilateral) (L>R)   3. Chronic lower extremity pain (Secondary Area of Pain) (Bilateral) (L>R)   4. Diabetic peripheral neuropathy ([place])   5. Diabetes mellitus due to underlying condition, uncontrolled, with diabetic neuropathy ([place])   6. Diabetic polyneuropathy associated with type 2 diabetes mellitus ([place])   7. Neurogenic pain   8. Vitamin D insufficiency   9. Chronic anticoagulationn (PLAVIX)    Problems updated and reviewed during this visit: Problem  Cellulitis of foot (Left)   Plan of Care  Pharmacotherapy (Medications Ordered): Meds ordered this encounter  Medications  . ergocalciferol (VITAMIN D2) 50000 units capsule    Sig: Take 1 capsule (50,000 Units total) by mouth 2 (two) times a week. X 6 weeks.    Dispense:  12 capsule    Refill:  0    Do not add this medication to  the electronic "Automatic Refill" notification system.  Patient may have prescription filled one day early if pharmacy is closed on scheduled refill date.  . Cholecalciferol (VITAMIN D3) 5000 units CAPS    Sig: Take 1 capsule (5,000 Units total) by mouth daily with breakfast. Take along with calcium and magnesium.    Dispense:  30 capsule    Refill:  5    Do not place medication on "Automatic Refill".  May substitute with similar over-the-counter product.  . Magnesium 500 MG CAPS    Sig: Take 1 capsule (500 mg total) by mouth 2 (two) times daily at 8 am and 10 pm.    Dispense:  60 capsule    Refill:  5    Do not place medication on "Automatic Refill".  The patient may use similar over-the-counter product.  . Calcium Carbonate-Vit D-Min (GNP CALCIUM 1200) 1200-1000 MG-UNIT CHEW    Sig: Chew 1,200 mg by mouth daily with breakfast. Take in combination with vitamin D and magnesium.    Dispense:  30 tablet    Refill:  5    Do not place medication on "Automatic Refill".  May substitute with similar over-the-counter product.   Procedure Orders    No procedure(s) ordered today   Lab Orders  No laboratory test(s) ordered today   Imaging Orders  No imaging studies ordered today   Referral Orders  No referral(s) requested today   Pharmacological management options:  Opioid Analgesics: I did not prescribe any opioid analgesics for this patient today. Membrane stabilizer: We have discussed the possibility of optimizing this mode of therapy, if tolerated.  He indicated that he tried some gabapentin 400 mg/day, but he stopped it because he did not see any benefit from it.  I tried to explain to the patient that maximum doses on the gabapentin could be as much as 3200 mg/day divided in 4 doses of 800 mg.  He did not appear to be interested in this.  I also did mention that the other medication that tends to be good in the treatment of neurogenic/neuropathic pain from diabetic neuropathy, would be the pregabalin or Lyrica.  I did explain to the patient the  difference between the 2, as well as a similarities.  At that time, he did not seem to be very enthusiastic about it. Muscle relaxant: None prescribed at this time. NSAID: None prescribed at this time. Other analgesic(s): None prescribed at this time.   Interventional management options: Planned, scheduled, and/or pending:    NOTE: PLAVIX Anticoagulation Diagnostic bilateral lumbar sympathetic block #1 under fluoroscopic guidance and IV sedation offered to the patient as an option to decrease the pain and increased blood flow to the lower extremities, thereby improving healing.  The patient indicated not being interested in interventional therapies, according to him due to the fact that he cannot afford to come in for those.   Considering:   Diagnostic bilateral lumbar sympathetic block #1  Possible bilateral lumbar sympathetic RFA  Possible spinal cord stimulator trial    PRN Procedures:   None at this time   Provider-requested follow-up: Return if patient decides to take advantage of our interventional therapies..  Future Appointments  Date Time Provider [place]  04/24/2018 10:00 AM [facility] VASC 1 [facility] None  04/24/2018 11:15 AM Dew, Erskine Squibb, MD [facility] None    Primary Care Physician: [name] Market, MD Location: [facility] Note by: [name] Cola, MD Date: 03/16/2018; Time: 5:07 PM

## 2018-03-16 ENCOUNTER — Encounter: Payer: Self-pay | Admitting: Pain Medicine

## 2018-03-16 ENCOUNTER — Other Ambulatory Visit: Payer: Self-pay

## 2018-03-16 ENCOUNTER — Ambulatory Visit: Payer: BLUE CROSS/BLUE SHIELD | Attending: Pain Medicine | Admitting: Pain Medicine

## 2018-03-16 VITALS — BP 136/111 | HR 85 | Temp 98.1°F | Resp 16 | Ht 70.0 in | Wt 249.0 lb

## 2018-03-16 DIAGNOSIS — M79672 Pain in left foot: Secondary | ICD-10-CM | POA: Insufficient documentation

## 2018-03-16 DIAGNOSIS — I10 Essential (primary) hypertension: Secondary | ICD-10-CM | POA: Diagnosis not present

## 2018-03-16 DIAGNOSIS — G4730 Sleep apnea, unspecified: Secondary | ICD-10-CM | POA: Insufficient documentation

## 2018-03-16 DIAGNOSIS — N400 Enlarged prostate without lower urinary tract symptoms: Secondary | ICD-10-CM | POA: Insufficient documentation

## 2018-03-16 DIAGNOSIS — M79605 Pain in left leg: Secondary | ICD-10-CM

## 2018-03-16 DIAGNOSIS — E1142 Type 2 diabetes mellitus with diabetic polyneuropathy: Secondary | ICD-10-CM | POA: Insufficient documentation

## 2018-03-16 DIAGNOSIS — G8929 Other chronic pain: Secondary | ICD-10-CM

## 2018-03-16 DIAGNOSIS — M79671 Pain in right foot: Secondary | ICD-10-CM | POA: Insufficient documentation

## 2018-03-16 DIAGNOSIS — E1151 Type 2 diabetes mellitus with diabetic peripheral angiopathy without gangrene: Secondary | ICD-10-CM | POA: Insufficient documentation

## 2018-03-16 DIAGNOSIS — Z833 Family history of diabetes mellitus: Secondary | ICD-10-CM | POA: Insufficient documentation

## 2018-03-16 DIAGNOSIS — M792 Neuralgia and neuritis, unspecified: Secondary | ICD-10-CM

## 2018-03-16 DIAGNOSIS — M79604 Pain in right leg: Secondary | ICD-10-CM | POA: Diagnosis not present

## 2018-03-16 DIAGNOSIS — IMO0002 Reserved for concepts with insufficient information to code with codable children: Secondary | ICD-10-CM

## 2018-03-16 DIAGNOSIS — Z789 Other specified health status: Secondary | ICD-10-CM | POA: Insufficient documentation

## 2018-03-16 DIAGNOSIS — Z79891 Long term (current) use of opiate analgesic: Secondary | ICD-10-CM | POA: Insufficient documentation

## 2018-03-16 DIAGNOSIS — Z7982 Long term (current) use of aspirin: Secondary | ICD-10-CM | POA: Insufficient documentation

## 2018-03-16 DIAGNOSIS — E559 Vitamin D deficiency, unspecified: Secondary | ICD-10-CM | POA: Insufficient documentation

## 2018-03-16 DIAGNOSIS — Z8781 Personal history of (healed) traumatic fracture: Secondary | ICD-10-CM | POA: Diagnosis not present

## 2018-03-16 DIAGNOSIS — G894 Chronic pain syndrome: Secondary | ICD-10-CM | POA: Diagnosis not present

## 2018-03-16 DIAGNOSIS — E785 Hyperlipidemia, unspecified: Secondary | ICD-10-CM | POA: Diagnosis not present

## 2018-03-16 DIAGNOSIS — Z794 Long term (current) use of insulin: Secondary | ICD-10-CM | POA: Diagnosis not present

## 2018-03-16 DIAGNOSIS — Z882 Allergy status to sulfonamides status: Secondary | ICD-10-CM | POA: Insufficient documentation

## 2018-03-16 DIAGNOSIS — Z7902 Long term (current) use of antithrombotics/antiplatelets: Secondary | ICD-10-CM | POA: Diagnosis not present

## 2018-03-16 DIAGNOSIS — Z8739 Personal history of other diseases of the musculoskeletal system and connective tissue: Secondary | ICD-10-CM | POA: Diagnosis not present

## 2018-03-16 DIAGNOSIS — Z89422 Acquired absence of other left toe(s): Secondary | ICD-10-CM | POA: Insufficient documentation

## 2018-03-16 DIAGNOSIS — Z7901 Long term (current) use of anticoagulants: Secondary | ICD-10-CM

## 2018-03-16 DIAGNOSIS — E0865 Diabetes mellitus due to underlying condition with hyperglycemia: Secondary | ICD-10-CM

## 2018-03-16 DIAGNOSIS — E0840 Diabetes mellitus due to underlying condition with diabetic neuropathy, unspecified: Secondary | ICD-10-CM

## 2018-03-16 DIAGNOSIS — Z79899 Other long term (current) drug therapy: Secondary | ICD-10-CM | POA: Diagnosis not present

## 2018-03-16 DIAGNOSIS — L03116 Cellulitis of left lower limb: Secondary | ICD-10-CM | POA: Insufficient documentation

## 2018-03-16 MED ORDER — MAGNESIUM 500 MG PO CAPS: 500.0000 mg | ORAL_CAPSULE | Freq: Two times a day (BID) | ORAL | 5 refills | Status: AC

## 2018-03-16 MED ORDER — ERGOCALCIFEROL 1.25 MG (50000 UT) PO CAPS: 50000.0000 [IU] | ORAL_CAPSULE | ORAL | 0 refills | Status: AC

## 2018-03-16 MED ORDER — VITAMIN D3 125 MCG (5000 UT) PO CAPS: 1.0000 | ORAL_CAPSULE | Freq: Every day | ORAL | 5 refills | Status: AC

## 2018-03-16 MED ORDER — GNP CALCIUM 1200 1200-1000 MG-UNIT PO CHEW: 1200.0000 mg | CHEWABLE_TABLET | Freq: Every day | ORAL | 5 refills | Status: DC

## 2018-03-16 NOTE — Progress Notes (Signed)
Safety precautions to be maintained throughout the outpatient stay will include: orient to surroundings, keep bed in low position, maintain call bell within reach at all times, provide assistance with transfer out of bed and ambulation.  

## 2018-03-16 NOTE — Patient Instructions (Signed)

## 2018-04-23 ENCOUNTER — Other Ambulatory Visit (INDEPENDENT_AMBULATORY_CARE_PROVIDER_SITE_OTHER): Payer: Self-pay | Admitting: Vascular Surgery

## 2018-04-23 DIAGNOSIS — I739 Peripheral vascular disease, unspecified: Secondary | ICD-10-CM

## 2018-04-24 ENCOUNTER — Encounter (INDEPENDENT_AMBULATORY_CARE_PROVIDER_SITE_OTHER): Payer: BLUE CROSS/BLUE SHIELD

## 2018-04-24 ENCOUNTER — Ambulatory Visit (INDEPENDENT_AMBULATORY_CARE_PROVIDER_SITE_OTHER): Payer: BLUE CROSS/BLUE SHIELD | Admitting: Nurse Practitioner

## 2018-04-30 ENCOUNTER — Encounter: Payer: Self-pay | Admitting: Emergency Medicine

## 2018-04-30 ENCOUNTER — Emergency Department: Payer: BLUE CROSS/BLUE SHIELD

## 2018-04-30 ENCOUNTER — Other Ambulatory Visit: Payer: Self-pay

## 2018-04-30 ENCOUNTER — Emergency Department
Admission: EM | Admit: 2018-04-30 | Discharge: 2018-04-30 | Disposition: A | Discharge status: Home / Self Care | Payer: BLUE CROSS/BLUE SHIELD | Attending: Emergency Medicine | Admitting: Emergency Medicine

## 2018-04-30 DIAGNOSIS — I10 Essential (primary) hypertension: Secondary | ICD-10-CM | POA: Diagnosis not present

## 2018-04-30 DIAGNOSIS — Z794 Long term (current) use of insulin: Secondary | ICD-10-CM | POA: Insufficient documentation

## 2018-04-30 DIAGNOSIS — Z79899 Other long term (current) drug therapy: Secondary | ICD-10-CM | POA: Diagnosis not present

## 2018-04-30 DIAGNOSIS — E119 Type 2 diabetes mellitus without complications: Secondary | ICD-10-CM | POA: Insufficient documentation

## 2018-04-30 DIAGNOSIS — R42 Dizziness and giddiness: Secondary | ICD-10-CM | POA: Diagnosis not present

## 2018-04-30 DIAGNOSIS — Z7902 Long term (current) use of antithrombotics/antiplatelets: Secondary | ICD-10-CM | POA: Diagnosis not present

## 2018-04-30 DIAGNOSIS — I2510 Atherosclerotic heart disease of native coronary artery without angina pectoris: Secondary | ICD-10-CM | POA: Insufficient documentation

## 2018-04-30 DIAGNOSIS — Z7982 Long term (current) use of aspirin: Secondary | ICD-10-CM | POA: Diagnosis not present

## 2018-04-30 LAB — CBC
HCT: 44.6 % (ref 39.0–52.0)
Hemoglobin: 14.7 g/dL (ref 13.0–17.0)
MCH: 30.4 pg (ref 26.0–34.0)
MCHC: 33 g/dL (ref 30.0–36.0)
MCV: 92.3 fL (ref 80.0–100.0)
NRBC: 0 % (ref 0.0–0.2)
PLATELETS: 231 10*3/uL (ref 150–400)
RBC: 4.83 MIL/uL (ref 4.22–5.81)
RDW: 12.9 % (ref 11.5–15.5)
WBC: 5.9 10*3/uL (ref 4.0–10.5)

## 2018-04-30 LAB — BASIC METABOLIC PANEL
ANION GAP: 7 (ref 5–15)
BUN: 22 mg/dL — ABNORMAL HIGH (ref 6–20)
CALCIUM: 9.4 mg/dL (ref 8.9–10.3)
CO2: 27 mmol/L (ref 22–32)
Chloride: 104 mmol/L (ref 98–111)
Creatinine, Ser: 0.83 mg/dL (ref 0.61–1.24)
GFR calc non Af Amer: >60 mL/min (ref >60)
Glucose, Bld: 187 mg/dL — ABNORMAL HIGH (ref 70–99)
Potassium: 3.8 mmol/L (ref 3.5–5.1)
Sodium: 138 mmol/L (ref 135–145)

## 2018-04-30 LAB — URINALYSIS, COMPLETE (UACMP) WITH MICROSCOPIC
BILIRUBIN URINE: NEGATIVE
Bacteria, UA: NONE SEEN
Glucose, UA: >=500 mg/dL — AB
Ketones, ur: NEGATIVE mg/dL
LEUKOCYTES UA: NEGATIVE
NITRITE: NEGATIVE
PH: 6 (ref 5.0–8.0)
Protein, ur: 100 mg/dL — AB
Specific Gravity, Urine: 1.011 (ref 1.005–1.030)

## 2018-04-30 LAB — TROPONIN I: Troponin I: <0.03 ng/mL (ref <0.03)

## 2018-04-30 MED ORDER — SODIUM CHLORIDE 0.9 % IV SOLN
1000.0000 mL | Freq: Once | INTRAVENOUS | Status: AC
  Administered 2018-04-30: 1000 mL via INTRAVENOUS

## 2018-04-30 NOTE — ED Triage Notes (Signed)
Patient to ER for c/o generalized weakness today upon waking. Patient states a few days ago he felt similar symptoms, but not as severe. Patient states "my legs feel like they want to give out on me and I feel jittery.".

## 2018-04-30 NOTE — ED Provider Notes (Signed)
[facility] Emergency Department Provider Note   ____________________________________________    I have reviewed the triage vital signs and the nursing notes.   HISTORY  Chief Complaint Weakness     HPI [name] is a 57 y.o. male who presents with complaints of weakness.  Patient describes feeling dizzy and weak primarily when ambulating.  He reports when he got up this morning to use the bathroom he felt lightheaded.  This occurred again when he was walking in the parking lot to the emergency department.  He denies chest pain.  No palpitations.  No fevers or chills.  No nausea or vomiting.  Normal stools.  No calf pain or swelling.  No pleurisy.  Currently feels well while sitting.  Past Medical History:  Diagnosis Date  . Arthritis   . Bacterial infection due to Morganella morganii 12/26/2017  . BPH (benign prostatic hypertrophy)   . Diabetes mellitus   . Diabetic neuropathy ([place])   . Hypertension   . Metatarsal bone fracture    left 5th toe  . Osteomyelitis of toe of right foot ([place])   . Peripheral vascular disease ([place])   . Post-operative infection    and diabetic ulcer left foot  . Sepsis due to Streptococcus, group B ([place]) 12/26/2017  . Sleep apnea     does wear CPAP  . Wears glasses     Patient Active Problem List   Diagnosis Date Noted  . Chronic anticoagulationn (PLAVIX) 03/15/2018  . Diabetes mellitus due to underlying condition, uncontrolled, with diabetic neuropathy ([place]) 03/10/2018  . Type 2 diabetes mellitus with diabetic polyneuropathy, with long-term current use of insulin ([place]) 03/10/2018  . Diabetic polyneuropathy associated with type 2 diabetes mellitus ([place]) 03/10/2018  . Atherosclerosis of native arteries of the extremities with ulceration ([place]) 02/24/2018  . Sleep apnea 02/24/2018  . Diabetic peripheral neuropathy ([place]) 02/24/2018  . Vitamin D insufficiency 02/24/2018  . Neurogenic pain 02/24/2018  . Chronic  foot pain (Primary Area of Pain) (Bilateral) (L>R) 02/10/2018  . Chronic lower extremity pain (Secondary Area of Pain) (Bilateral) (L>R) 02/10/2018  . Chronic pain syndrome 02/10/2018  . Long term current use of opiate analgesic 02/10/2018  . Pharmacologic therapy 02/10/2018  . Disorder of skeletal system 02/10/2018  . Problems influencing health status 02/10/2018  . Bacterial infection due to Morganella morganii 12/26/2017  . Sepsis due to Streptococcus, group B ([place]) 12/26/2017  . HTN (hypertension) 11/29/2017  . HLD (hyperlipidemia) 11/29/2017  . Osteomyelitis ([place]) 11/29/2017  . Cellulitis of foot (Left) 03/05/2017  . Surgery, elective   . Hardware complicating wound infection ([place])   . Metatarsal stress fracture of left foot 01/12/2016  . Diabetic foot infection ([place]) 01/12/2016  . Diabetic ulcer of foot associated with type 2 diabetes mellitus, with necrosis of muscle ([place]) 01/11/2016    Past Surgical History:  Procedure Laterality Date  . AMPUTATION TOE Left    small toe  . COLONOSCOPY W/ BIOPSIES AND POLYPECTOMY    . I&D EXTREMITY Left 01/11/2016   Procedure: LEFT FOOT IRRIGATION AND DEBRIDEMENT WOUND VAC AND REMOVAL OF HARDWARE;  Surgeon: Wylene Simmer, MD;  Location: [facility];  Service: Orthopedics;  Laterality: Left;  . LOWER EXTREMITY ANGIOGRAPHY N/A 12/01/2017   Procedure: Lower Extremity Angiography;  Surgeon: Algernon Huxley, MD;  Location: [place] [facility];  Service: Cardiovascular;  Laterality: N/A;  . LOWER EXTREMITY ANGIOGRAPHY Right 12/04/2017   Procedure: Lower Extremity Angiography;  Surgeon: Algernon Huxley, MD;  Location: [place] [facility];  Service: Cardiovascular;  Laterality: Right;  . LOWER EXTREMITY ANGIOGRAPHY Left 03/12/2018   Procedure: LOWER EXTREMITY ANGIOGRAPHY;  Surgeon: Algernon Huxley, MD;  Location: [place] [facility];  Service: Cardiovascular;  Laterality: Left;  . ORIF TOE FRACTURE Left 06/08/2015   Procedure: OPEN REDUCTION INTERNAL FIXATION (ORIF) LEFT  FIFTH METATARSAL BASE FRACTURE NONUNION; CALCANEAL AUTOGRAFT ;  Surgeon: Wylene Simmer, MD;  Location: [facility];  Service: Orthopedics;  Laterality: Left;  . PATELLA RECONSTRUCTION Left 2005  . PROSTATE ABLATION  2014  . TOE AMPUTATION     partial amputation right great toe  . WOUND DEBRIDEMENT Bilateral 11/30/2017   Procedure: DEBRIDEMENT WOUND;  Surgeon: Sharlotte Alamo, DPM;  Location: [facility];  Service: Podiatry;  Laterality: Bilateral;    Prior to Admission medications   Medication Sig Start Date End Date Taking? Authorizing Provider  aspirin EC 81 MG tablet Take 81 mg by mouth daily.    [provider]  atorvastatin (LIPITOR) 10 MG tablet Take 1 tablet (10 mg total) by mouth daily. 03/12/18 03/12/19  Algernon Huxley, MD  Calcium Carbonate-Vit D-Min (GNP CALCIUM 1200) 1200-1000 MG-UNIT CHEW Chew 1,200 mg by mouth daily with breakfast. Take in combination with vitamin D and magnesium. 03/16/18 09/12/18  Milinda Pointer, MD  Cholecalciferol (VITAMIN D3) 5000 units CAPS Take 1 capsule (5,000 Units total) by mouth daily with breakfast. Take along with calcium and magnesium. 03/16/18 09/12/18  Milinda Pointer, MD  clopidogrel (PLAVIX) 75 MG tablet Take 1 tablet (75 mg total) by mouth daily. 03/12/18   Algernon Huxley, MD  HUMALOG KWIKPEN 100 UNIT/ML KiwkPen Inject 0-15 Units into the skin 3 (three) times daily. MAX-45 units a day 01/06/18   [provider]  Insulin Glargine (BASAGLAR KWIKPEN) 100 UNIT/ML SOPN Inject 0.35 mLs (35 Units total) into the skin at bedtime. 05/21/17   Renato Shin, MD  losartan (COZAAR) 50 MG tablet Take 50 mg by mouth daily.    [provider]  Magnesium 500 MG CAPS Take 1 capsule (500 mg total) by mouth 2 (two) times daily at 8 am and 10 pm. 03/16/18 09/12/18  Milinda Pointer, MD  metFORMIN (GLUCOPHAGE) 1000 MG tablet Take 1,000 mg by mouth 2 (two) times daily. 10/21/15   [provider]     Allergies Bactrim  [sulfamethoxazole-trimethoprim]  Family History  Problem Relation Age of Onset  . Diabetes Mother   . Diabetes Other     Social History Social History   Tobacco Use  . Smoking status: Never Smoker  . Smokeless tobacco: Never Used  Substance Use Topics  . Alcohol use: Yes    Comment: occasional   . Drug use: Yes    Types: Marijuana, Cocaine    Comment: last month    Review of Systems  Constitutional: No fever/chills Eyes: No visual changes.  ENT: No sore throat. Cardiovascular: Denies chest pain. Respiratory: Denies shortness of breath. Gastrointestinal: No abdominal pain.  No nausea, no vomiting.   Genitourinary: Negative for dysuria. Musculoskeletal: Negative for back pain. Skin: Negative for rash. Neurological: Negative for headaches   ____________________________________________   PHYSICAL EXAM:  VITAL SIGNS: ED Triage Vitals  Enc Vitals Group     BP 04/30/18 1112 (!) 171/90     Pulse Rate 04/30/18 1112 93     Resp 04/30/18 1112 20     Temp 04/30/18 1112 97.8 F (36.6 C)     Temp Source 04/30/18 1112 Oral  SpO2 04/30/18 1112 97 %     Weight 04/30/18 1113 108.9 kg (240 lb)     Height 04/30/18 1113 1.778 m (5\' 10" )     Head Circumference --      Peak Flow --      Pain Score 04/30/18 1113 7     Pain Loc --      Pain Edu? --      Excl. in [place]? --     Constitutional: Alert and oriented.  Eyes: Conjunctivae are normal.   Nose: No congestion/rhinnorhea. Mouth/Throat: Mucous membranes are moist.    Cardiovascular: Normal rate, regular rhythm. Grossly normal heart sounds.  Good peripheral circulation. Respiratory: Normal respiratory effort.  No retractions. Lungs CTAB. Gastrointestinal: Soft and nontender. No distention.    Musculoskeletal:  Warm and well perfused Neurologic:  Normal speech and language. No gross focal neurologic deficits are appreciated.  Skin:  Skin is warm, dry and intact. No rash noted. Psychiatric: Mood and affect are normal.  Speech and behavior are normal.  ____________________________________________   LABS (all labs ordered are listed, but only abnormal results are displayed)  Labs Reviewed  BASIC METABOLIC PANEL - Abnormal; Notable for the following components:      Result Value   Glucose, Bld 187 (*)    BUN 22 (*)    All other components within normal limits  URINALYSIS, COMPLETE (UACMP) WITH MICROSCOPIC - Abnormal; Notable for the following components:   Color, Urine STRAW (*)    APPearance CLEAR (*)    Glucose, UA >=500 (*)    Hgb urine dipstick SMALL (*)    Protein, ur 100 (*)    All other components within normal limits  CBC  TROPONIN I   ____________________________________________  EKG  ED ECG REPORT I, Lavonia Drafts, the attending physician, personally viewed and interpreted this ECG.  Date: 04/30/2018  Rhythm: normal sinus rhythm QRS Axis: normal Intervals: normal ST/T Wave abnormalities: normal Narrative Interpretation: PACs  ____________________________________________  RADIOLOGY  Chest x-ray unremarkable ____________________________________________   PROCEDURES  Procedure(s) performed: No  Procedures   Critical Care performed: No ____________________________________________   INITIAL IMPRESSION / ASSESSMENT AND PLAN / ED COURSE  Pertinent labs & imaging results that were available during my care of the patient were reviewed by me and considered in my medical decision making (see chart for details).  Patient overall well-appearing in no acute distress, describes dizziness and lightheadedness which seems to have resolved.  No neuro deficits to suggest CVA.  No chest pain or palpitations.  No nausea vomiting or diarrhea.  Will check labs, potentially give IV fluids, check chest x-ray and reevaluate.  ----------------------------------------- 2:47 PM on 04/30/2018 -----------------------------------------  Patient's lab work is unremarkable, received IV  fluids and feels quite well at this time.  Vital signs unremarkable.  He states he is ready to go home.  We discussed return precautions and the need for outpatient follow-up with PCP.    ____________________________________________   FINAL CLINICAL IMPRESSION(S) / ED DIAGNOSES  Final diagnoses:  Dizziness        Note:  This document was prepared using Dragon voice recognition software and may include unintentional dictation errors.    Lavonia Drafts, MD 04/30/18 [phone]

## 2018-04-30 NOTE — ED Notes (Signed)
Pt leaving for CXR.  

## 2018-05-07 ENCOUNTER — Encounter (INDEPENDENT_AMBULATORY_CARE_PROVIDER_SITE_OTHER): Payer: Self-pay | Admitting: Vascular Surgery

## 2018-06-18 ENCOUNTER — Telehealth (INDEPENDENT_AMBULATORY_CARE_PROVIDER_SITE_OTHER): Payer: Self-pay | Admitting: Vascular Surgery

## 2018-06-18 NOTE — Telephone Encounter (Signed)
Schedule an appointment for him.  Schedule him with bilateral ABIs and a left lower extremity DVT study.

## 2018-06-18 NOTE — Telephone Encounter (Signed)
Please schedule appointment listed below for Patient

## 2018-06-18 NOTE — Telephone Encounter (Signed)
Please advise on below  

## 2018-06-22 ENCOUNTER — Other Ambulatory Visit (INDEPENDENT_AMBULATORY_CARE_PROVIDER_SITE_OTHER): Payer: BLUE CROSS/BLUE SHIELD

## 2018-06-22 ENCOUNTER — Other Ambulatory Visit: Payer: Self-pay

## 2018-06-22 ENCOUNTER — Ambulatory Visit (INDEPENDENT_AMBULATORY_CARE_PROVIDER_SITE_OTHER): Payer: BLUE CROSS/BLUE SHIELD | Admitting: Nurse Practitioner

## 2018-06-22 ENCOUNTER — Other Ambulatory Visit (INDEPENDENT_AMBULATORY_CARE_PROVIDER_SITE_OTHER): Payer: Self-pay | Admitting: Vascular Surgery

## 2018-06-22 ENCOUNTER — Encounter (INDEPENDENT_AMBULATORY_CARE_PROVIDER_SITE_OTHER): Payer: Self-pay | Admitting: Nurse Practitioner

## 2018-06-22 VITALS — BP 127/78 | HR 101 | Resp 16 | Ht 70.0 in | Wt 252.0 lb

## 2018-06-22 DIAGNOSIS — I7025 Atherosclerosis of native arteries of other extremities with ulceration: Secondary | ICD-10-CM

## 2018-06-22 DIAGNOSIS — I739 Peripheral vascular disease, unspecified: Secondary | ICD-10-CM

## 2018-06-22 DIAGNOSIS — R252 Cramp and spasm: Secondary | ICD-10-CM

## 2018-06-22 DIAGNOSIS — E1142 Type 2 diabetes mellitus with diabetic polyneuropathy: Secondary | ICD-10-CM | POA: Diagnosis not present

## 2018-06-22 DIAGNOSIS — E785 Hyperlipidemia, unspecified: Secondary | ICD-10-CM

## 2018-06-22 DIAGNOSIS — Z09 Encounter for follow-up examination after completed treatment for conditions other than malignant neoplasm: Secondary | ICD-10-CM | POA: Diagnosis not present

## 2018-06-25 ENCOUNTER — Encounter (INDEPENDENT_AMBULATORY_CARE_PROVIDER_SITE_OTHER): Payer: Self-pay | Admitting: Nurse Practitioner

## 2018-06-25 NOTE — Progress Notes (Signed)
Subjective:    Patient ID: [name], male    DOB: 02/02/1961, 57 y.o.   MRN: [id number] Chief Complaint  Patient presents with  . Follow-up  . Arterial Stenosis    HPI  [name] is a 57 y.o. male that contacted our office due to the concern that he is left lower extremity was having cramps as well as muscle fasciculations of his calf.  He was also concerned because he states that it is somewhat swollen.  He underwent intervention on 03/12/2018 for a left lower extremity ulceration.  He underwent a left percutaneous transluminal angioplasty of the peroneal artery as well as the tibioperoneal trunk and anterior tibial artery.  Since his intervention his wound has continued to heal he states that it is much better.  The patient denies amaurosis fugax, TIA there is no history of CVA. The patient denies past history of DVT, PE or superficial thrombophlebitis. The patient denies claudication-like symptoms, no rest pain  Patient underwent bilateral ABIs today which revealed a right ABI of 0.97 with a left of 1.12.  The left TBI 0.66.  The right lower extremity has biphasic tibial waveforms.  The left lower extremity has triphasic posterior tibial waveforms with biphasic anterior tibial waveforms.  Currently has waveforms in all of his digits.  However the right first digit has weak toe waveforms although partially amputated.  The left first digit and fifth digit also have toe waveforms.  Patient also underwent a left lower extremity DVT study which reveals no evidence of DVT bilaterally.  There is evidence of an enlarged lymph node in the left groin area however this has been chronic.  There is also reflux seen in the left great saphenous vein at the proximal calf.  Past Medical History:  Diagnosis Date  . Arthritis   . Bacterial infection due to Morganella morganii 12/26/2017  . BPH (benign prostatic hypertrophy)   . Diabetes mellitus   . Diabetic neuropathy ([place])   .  Hypertension   . Metatarsal bone fracture    left 5th toe  . Osteomyelitis of toe of right foot ([place])   . Peripheral vascular disease ([place])   . Post-operative infection    and diabetic ulcer left foot  . Sepsis due to Streptococcus, group B ([place]) 12/26/2017  . Sleep apnea     does wear CPAP  . Wears glasses     Past Surgical History:  Procedure Laterality Date  . AMPUTATION TOE Left    small toe  . COLONOSCOPY W/ BIOPSIES AND POLYPECTOMY    . I&D EXTREMITY Left 01/11/2016   Procedure: LEFT FOOT IRRIGATION AND DEBRIDEMENT WOUND VAC AND REMOVAL OF HARDWARE;  Surgeon: Wylene Simmer, MD;  Location: [facility];  Service: Orthopedics;  Laterality: Left;  . LOWER EXTREMITY ANGIOGRAPHY N/A 12/01/2017   Procedure: Lower Extremity Angiography;  Surgeon: Algernon Huxley, MD;  Location: [facility];  Service: Cardiovascular;  Laterality: N/A;  . LOWER EXTREMITY ANGIOGRAPHY Right 12/04/2017   Procedure: Lower Extremity Angiography;  Surgeon: Algernon Huxley, MD;  Location: [facility];  Service: Cardiovascular;  Laterality: Right;  . LOWER EXTREMITY ANGIOGRAPHY Left 03/12/2018   Procedure: LOWER EXTREMITY ANGIOGRAPHY;  Surgeon: Algernon Huxley, MD;  Location: [facility];  Service: Cardiovascular;  Laterality: Left;  . ORIF TOE FRACTURE Left 06/08/2015   Procedure: OPEN REDUCTION INTERNAL FIXATION (ORIF) LEFT FIFTH METATARSAL BASE FRACTURE NONUNION; CALCANEAL AUTOGRAFT ;  Surgeon: Wylene Simmer, MD;  Location: [facility];  Service: Orthopedics;  Laterality: Left;  . PATELLA RECONSTRUCTION Left 2005  . PROSTATE ABLATION  2014  . TOE AMPUTATION     partial amputation right great toe  . WOUND DEBRIDEMENT Bilateral 11/30/2017   Procedure: DEBRIDEMENT WOUND;  Surgeon: Sharlotte Alamo, DPM;  Location: [facility];  Service: Podiatry;  Laterality: Bilateral;    Social History   Socioeconomic History  . Marital status: Married    Spouse name: Not on file  . Number of children: Not on file    . Years of education: Not on file  . Highest education level: Not on file  Occupational History  . Not on file  Social Needs  . Financial resource strain: Not on file  . Food insecurity:    Worry: Not on file    Inability: Not on file  . Transportation needs:    Medical: Not on file    Non-medical: Not on file  Tobacco Use  . Smoking status: Never Smoker  . Smokeless tobacco: Never Used  Substance and Sexual Activity  . Alcohol use: Yes    Comment: occasional   . Drug use: Yes    Types: Marijuana, Cocaine    Comment: last month  . Sexual activity: Not on file  Lifestyle  . Physical activity:    Days per week: Not on file    Minutes per session: Not on file  . Stress: Not on file  Relationships  . Social connections:    Talks on phone: Not on file    Gets together: Not on file    Attends religious service: Not on file    Active member of club or organization: Not on file    Attends meetings of clubs or organizations: Not on file    Relationship status: Not on file  . Intimate partner violence:    Fear of current or ex partner: Not on file    Emotionally abused: Not on file    Physically abused: Not on file    Forced sexual activity: Not on file  Other Topics Concern  . Not on file  Social History Narrative   Lives with wife. Children all grown up.    Family History  Problem Relation Age of Onset  . Diabetes Mother   . Diabetes Other     Allergies  Allergen Reactions  . Bactrim [Sulfamethoxazole-Trimethoprim] Hives     Review of Systems   Review of Systems: Negative Unless Checked Constitutional: [] Weight loss  [] Fever  [] Chills Cardiac: [] Chest pain   []  Atrial Fibrillation  [] Palpitations   [] Shortness of breath when laying flat   [] Shortness of breath with exertion. [] Shortness of breath at rest Vascular:  [] Pain in legs with walking   [] Pain in legs with standing [] Pain in legs when laying flat   [] Claudication    [] Pain in feet when laying flat     [] History of DVT   [] Phlebitis   [x] Swelling in legs   [] Varicose veins   [] Non-healing ulcers Pulmonary:   [] Uses home oxygen   [] Productive cough   [] Hemoptysis   [] Wheeze  [] COPD   [] Asthma Neurologic:  [] Dizziness   [] Seizures  [] Blackouts [] History of stroke   [] History of TIA  [] Aphasia   [] Temporary Blindness   [] Weakness or numbness in arm   [] Weakness or numbness in leg Musculoskeletal:   [] Joint swelling   [] Joint pain   [] Low back pain  []  History of Knee Replacement [x] Arthritis [] back Surgeries  []  Spinal Stenosis  Hematologic:  [] Easy bruising  [] Easy bleeding   [] Hypercoagulable state   [] Anemic Gastrointestinal:  [] Diarrhea   [] Vomiting  [] Gastroesophageal reflux/heartburn   [] Difficulty swallowing. [] Abdominal pain Genitourinary:  [] Chronic kidney disease   [] Difficult urination  [] Anuric   [] Blood in urine [] Frequent urination  [] Burning with urination   [] Hematuria Skin:  [] Rashes   [x] Ulcers [] Wounds Psychological:  [] History of anxiety   []  History of major depression  []  Memory Difficulties     Objective:   Physical Exam  BP 127/78 (BP Location: Left Arm, Patient Position: Sitting)   Pulse (!) 101   Resp 16   Ht 5\' 10"  (1.778 m)   Wt 252 lb (114.3 kg)   BMI 36.16 kg/m   Gen: WD/WN, NAD Head: [place]/AT, No temporalis wasting.  Ear/Nose/Throat: Hearing grossly intact, nares w/o erythema or drainage Eyes: PER, EOMI, sclera nonicteric.  Neck: Supple, no masses.  No JVD.  Pulmonary:  Good air movement, no use of accessory muscles.  Cardiac: RRR Vascular:  1+ nonpitting edema Vessel Right Left  Radial Palpable Palpable   Gastrointestinal: soft, non-distended. No guarding/no peritoneal signs.  Musculoskeletal: M/S 5/5 throughout.    Partial right great toe amputation neurologic: Pain and light touch intact in extremities.  Symmetrical.  Speech is fluent. Motor exam as listed above. Psychiatric: Judgment intact, Mood & affect appropriate for pt's clinical  situation. Dermatologic: No Venous rashes. No Ulcers Noted.  No changes consistent with cellulitis. Lymph : No Cervical lymphadenopathy, lichenification bilaterally     Assessment & Plan:   1. Atherosclerosis of native arteries of the extremities with ulceration ([place]) Patient underwent bilateral ABIs today which revealed a right ABI of 0.97 with a left of 1.12.  The left TBI 0.66.  The right lower extremity has biphasic tibial waveforms.  The left lower extremity has triphasic posterior tibial waveforms with biphasic anterior tibial waveforms.  Currently has waveforms in all of his digits.  However the right first digit has weak toe waveforms although partially amputated.  The left first digit and fifth digit also have toe waveforms.  Patient states that his wound is currently healing much better.  Given his current studies we will not plan for any other further interventions at this time.  He is currently being followed by podiatry for his foot ulcerations.  Patient was advised that should his wounds stop healing or if his ulceration gets worse that he should follow-up sooner, otherwise we will have him follow-up in 6 months with repeat ABIs. - VAS [place] ABI WITH/WO TBI; Future  2. Diabetic peripheral neuropathy ([place]) Continue hypoglycemic medications as already ordered, these medications have been reviewed and there are no changes at this time.  Hgb A1C to be monitored as already arranged by primary service   3. Hyperlipidemia, unspecified hyperlipidemia type Continue statin as ordered and reviewed, no changes at this time  4. Cramps of left lower extremity Patient also underwent a left lower extremity DVT study which reveals no evidence of DVT bilaterally.  There is evidence of an enlarged lymph node in the left groin area however this has been chronic.  There is also reflux seen in the left great saphenous vein at the proximal calf.  Recommend:  The patient is describing Charley horse  type leg cramps. No invasive studies, angiography or surgery at this time.    I have reviewed homeopathic remedies such as Cider vinegar or mustard; placing a bar of soap at the bottom of the bed. Quinine is also an  option Magnesium supplementation at bedtime was also reviewed.  The patient should continue walking and begin a more formal exercise program.  The patient should continue antiplatelet therapy and aggressive treatment of the lipid abnormalities  The patient should continue wearing graduated compression socks 10-15 mmHg strength to control any mild edema.     Current Outpatient Medications on File Prior to Visit  Medication Sig Dispense Refill  . aspirin EC 81 MG tablet Take 81 mg by mouth daily.    Marland Kitchen atorvastatin (LIPITOR) 10 MG tablet Take 1 tablet (10 mg total) by mouth daily. 30 tablet 11  . Calcium Carbonate-Vit D-Min (GNP CALCIUM 1200) 1200-1000 MG-UNIT CHEW Chew 1,200 mg by mouth daily with breakfast. Take in combination with vitamin D and magnesium. 30 tablet 5  . Cholecalciferol (VITAMIN D3) 5000 units CAPS Take 1 capsule (5,000 Units total) by mouth daily with breakfast. Take along with calcium and magnesium. 30 capsule 5  . clopidogrel (PLAVIX) 75 MG tablet Take 1 tablet (75 mg total) by mouth daily. 30 tablet 11  . HUMALOG KWIKPEN 100 UNIT/ML KiwkPen Inject 0-15 Units into the skin 3 (three) times daily. MAX-45 units a day    . Insulin Glargine (BASAGLAR KWIKPEN) 100 UNIT/ML SOPN Inject 0.35 mLs (35 Units total) into the skin at bedtime. 30 pen 3  . losartan (COZAAR) 50 MG tablet Take 50 mg by mouth daily.    . Magnesium 500 MG CAPS Take 1 capsule (500 mg total) by mouth 2 (two) times daily at 8 am and 10 pm. 60 capsule 5  . metFORMIN (GLUCOPHAGE) 1000 MG tablet Take 1,000 mg by mouth 2 (two) times daily.     Current Facility-Administered Medications on File Prior to Visit  Medication Dose Route Frequency Provider Last Rate Last Dose  . Tdap (BOOSTRIX) injection  0.5 mL  0.5 mL Intramuscular Once Betancourt, Aura Fey, NP        There are no Patient Instructions on file for this visit. No follow-ups on file.   Kris Hartmann, NP  This note was completed with Sales executive.  Any errors are purely unintentional.

## 2018-07-14 ENCOUNTER — Encounter

## 2018-07-14 ENCOUNTER — Ambulatory Visit (INDEPENDENT_AMBULATORY_CARE_PROVIDER_SITE_OTHER): Payer: BLUE CROSS/BLUE SHIELD | Admitting: Vascular Surgery

## 2018-07-14 ENCOUNTER — Encounter (INDEPENDENT_AMBULATORY_CARE_PROVIDER_SITE_OTHER): Payer: BLUE CROSS/BLUE SHIELD

## 2018-12-24 ENCOUNTER — Encounter (INDEPENDENT_AMBULATORY_CARE_PROVIDER_SITE_OTHER): Payer: Self-pay

## 2018-12-24 ENCOUNTER — Ambulatory Visit (INDEPENDENT_AMBULATORY_CARE_PROVIDER_SITE_OTHER): Payer: Self-pay | Admitting: Nurse Practitioner

## 2019-04-01 ENCOUNTER — Inpatient Hospital Stay
Admission: EM | Admit: 2019-04-01 | Discharge: 2019-04-04 | DRG: 617 | Disposition: A | Discharge status: Home / Self Care | Payer: Medicare HMO | Attending: Internal Medicine | Admitting: Internal Medicine

## 2019-04-01 ENCOUNTER — Emergency Department: Payer: Medicare HMO

## 2019-04-01 ENCOUNTER — Other Ambulatory Visit: Payer: Self-pay

## 2019-04-01 DIAGNOSIS — E11621 Type 2 diabetes mellitus with foot ulcer: Secondary | ICD-10-CM | POA: Diagnosis present

## 2019-04-01 DIAGNOSIS — E785 Hyperlipidemia, unspecified: Secondary | ICD-10-CM | POA: Diagnosis present

## 2019-04-01 DIAGNOSIS — E1142 Type 2 diabetes mellitus with diabetic polyneuropathy: Secondary | ICD-10-CM | POA: Diagnosis present

## 2019-04-01 DIAGNOSIS — G8929 Other chronic pain: Secondary | ICD-10-CM | POA: Diagnosis present

## 2019-04-01 DIAGNOSIS — E669 Obesity, unspecified: Secondary | ICD-10-CM | POA: Diagnosis present

## 2019-04-01 DIAGNOSIS — Z833 Family history of diabetes mellitus: Secondary | ICD-10-CM

## 2019-04-01 DIAGNOSIS — Z713 Dietary counseling and surveillance: Secondary | ICD-10-CM | POA: Diagnosis not present

## 2019-04-01 DIAGNOSIS — M869 Osteomyelitis, unspecified: Secondary | ICD-10-CM | POA: Diagnosis not present

## 2019-04-01 DIAGNOSIS — Z6833 Body mass index (BMI) 33.0-33.9, adult: Secondary | ICD-10-CM | POA: Diagnosis not present

## 2019-04-01 DIAGNOSIS — E1151 Type 2 diabetes mellitus with diabetic peripheral angiopathy without gangrene: Secondary | ICD-10-CM | POA: Diagnosis present

## 2019-04-01 DIAGNOSIS — N400 Enlarged prostate without lower urinary tract symptoms: Secondary | ICD-10-CM | POA: Diagnosis present

## 2019-04-01 DIAGNOSIS — Z7982 Long term (current) use of aspirin: Secondary | ICD-10-CM

## 2019-04-01 DIAGNOSIS — I10 Essential (primary) hypertension: Secondary | ICD-10-CM | POA: Diagnosis present

## 2019-04-01 DIAGNOSIS — Z20828 Contact with and (suspected) exposure to other viral communicable diseases: Secondary | ICD-10-CM | POA: Diagnosis present

## 2019-04-01 DIAGNOSIS — Z7902 Long term (current) use of antithrombotics/antiplatelets: Secondary | ICD-10-CM

## 2019-04-01 DIAGNOSIS — Z882 Allergy status to sulfonamides status: Secondary | ICD-10-CM | POA: Diagnosis not present

## 2019-04-01 DIAGNOSIS — L97526 Non-pressure chronic ulcer of other part of left foot with bone involvement without evidence of necrosis: Secondary | ICD-10-CM | POA: Diagnosis present

## 2019-04-01 DIAGNOSIS — Z794 Long term (current) use of insulin: Secondary | ICD-10-CM | POA: Diagnosis not present

## 2019-04-01 DIAGNOSIS — Z79899 Other long term (current) drug therapy: Secondary | ICD-10-CM | POA: Diagnosis not present

## 2019-04-01 DIAGNOSIS — E1169 Type 2 diabetes mellitus with other specified complication: Secondary | ICD-10-CM | POA: Diagnosis not present

## 2019-04-01 DIAGNOSIS — G4730 Sleep apnea, unspecified: Secondary | ICD-10-CM | POA: Diagnosis present

## 2019-04-01 DIAGNOSIS — I491 Atrial premature depolarization: Secondary | ICD-10-CM | POA: Diagnosis present

## 2019-04-01 DIAGNOSIS — L02612 Cutaneous abscess of left foot: Secondary | ICD-10-CM | POA: Diagnosis present

## 2019-04-01 DIAGNOSIS — Z89411 Acquired absence of right great toe: Secondary | ICD-10-CM

## 2019-04-01 DIAGNOSIS — E559 Vitamin D deficiency, unspecified: Secondary | ICD-10-CM

## 2019-04-01 DIAGNOSIS — Z89422 Acquired absence of other left toe(s): Secondary | ICD-10-CM

## 2019-04-01 LAB — SARS CORONAVIRUS 2 BY RT PCR (HOSPITAL ORDER, PERFORMED IN ~~LOC~~ HOSPITAL LAB): SARS Coronavirus 2: NEGATIVE

## 2019-04-01 LAB — COMPREHENSIVE METABOLIC PANEL
ALT: 14 U/L (ref 0–44)
AST: 15 U/L (ref 15–41)
Albumin: 4 g/dL (ref 3.5–5.0)
Alkaline Phosphatase: 95 U/L (ref 38–126)
Anion gap: 10 (ref 5–15)
BUN: 18 mg/dL (ref 6–20)
CO2: 29 mmol/L (ref 22–32)
Calcium: 9.3 mg/dL (ref 8.9–10.3)
Chloride: 97 mmol/L — ABNORMAL LOW (ref 98–111)
Creatinine, Ser: 1.02 mg/dL (ref 0.61–1.24)
GFR calc Af Amer: >60 mL/min (ref >60)
GFR calc non Af Amer: >60 mL/min (ref >60)
Glucose, Bld: 120 mg/dL — ABNORMAL HIGH (ref 70–99)
Potassium: 3.8 mmol/L (ref 3.5–5.1)
Sodium: 136 mmol/L (ref 135–145)
Total Bilirubin: 0.9 mg/dL (ref 0.3–1.2)
Total Protein: 7.9 g/dL (ref 6.5–8.1)

## 2019-04-01 LAB — CBC WITH DIFFERENTIAL/PLATELET
Abs Immature Granulocytes: 0.05 10*3/uL (ref 0.00–0.07)
Basophils Absolute: 0 10*3/uL (ref 0.0–0.1)
Basophils Relative: 0 %
Eosinophils Absolute: 0 10*3/uL (ref 0.0–0.5)
Eosinophils Relative: 0 %
HCT: 35.9 % — ABNORMAL LOW (ref 39.0–52.0)
Hemoglobin: 12.2 g/dL — ABNORMAL LOW (ref 13.0–17.0)
Immature Granulocytes: 0 %
Lymphocytes Relative: 16 %
Lymphs Abs: 2 10*3/uL (ref 0.7–4.0)
MCH: 31.5 pg (ref 26.0–34.0)
MCHC: 34 g/dL (ref 30.0–36.0)
MCV: 92.8 fL (ref 80.0–100.0)
Monocytes Absolute: 1.7 10*3/uL — ABNORMAL HIGH (ref 0.1–1.0)
Monocytes Relative: 13 %
Neutro Abs: 8.8 10*3/uL — ABNORMAL HIGH (ref 1.7–7.7)
Neutrophils Relative %: 71 %
Platelets: 244 10*3/uL (ref 150–400)
RBC: 3.87 MIL/uL — ABNORMAL LOW (ref 4.22–5.81)
RDW: 12.7 % (ref 11.5–15.5)
WBC: 12.6 10*3/uL — ABNORMAL HIGH (ref 4.0–10.5)
nRBC: 0 % (ref 0.0–0.2)

## 2019-04-01 LAB — GLUCOSE, CAPILLARY: Glucose-Capillary: 129 mg/dL — ABNORMAL HIGH (ref 70–99)

## 2019-04-01 MED ORDER — ONDANSETRON HCL 4 MG/2ML IJ SOLN: 4.0000 mg | Freq: Four times a day (QID) | INTRAMUSCULAR | Status: DC | PRN

## 2019-04-01 MED ORDER — PIPERACILLIN-TAZOBACTAM 3.375 G IVPB 30 MIN
3.3750 g | Freq: Once | INTRAVENOUS | Status: AC
  Administered 2019-04-01: 3.375 g via INTRAVENOUS
  Filled 2019-04-01: qty 50

## 2019-04-01 MED ORDER — AMLODIPINE BESYLATE 10 MG PO TABS
10.0000 mg | ORAL_TABLET | Freq: Every day | ORAL | Status: DC
  Administered 2019-04-03 – 2019-04-04 (×2): 10 mg via ORAL
  Filled 2019-04-01 (×2): qty 1

## 2019-04-01 MED ORDER — LOSARTAN POTASSIUM 50 MG PO TABS
100.0000 mg | ORAL_TABLET | Freq: Every day | ORAL | Status: DC
  Administered 2019-04-03 – 2019-04-04 (×2): 100 mg via ORAL
  Filled 2019-04-01 (×2): qty 2

## 2019-04-01 MED ORDER — HYDROCHLOROTHIAZIDE 25 MG PO TABS
25.0000 mg | ORAL_TABLET | Freq: Every day | ORAL | Status: DC
  Administered 2019-04-03 – 2019-04-04 (×2): 25 mg via ORAL
  Filled 2019-04-01 (×2): qty 1

## 2019-04-01 MED ORDER — INSULIN ASPART 100 UNIT/ML ~~LOC~~ SOLN
0.0000 [IU] | Freq: Four times a day (QID) | SUBCUTANEOUS | Status: DC
  Administered 2019-04-01 – 2019-04-02 (×2): 1 [IU] via SUBCUTANEOUS
  Administered 2019-04-02: 3 [IU] via SUBCUTANEOUS
  Administered 2019-04-03 – 2019-04-04 (×5): 2 [IU] via SUBCUTANEOUS
  Filled 2019-04-01 (×8): qty 1

## 2019-04-01 MED ORDER — ACETAMINOPHEN 325 MG PO TABS
650.0000 mg | ORAL_TABLET | Freq: Four times a day (QID) | ORAL | Status: DC | PRN
  Administered 2019-04-01 – 2019-04-02 (×2): 650 mg via ORAL
  Filled 2019-04-01 (×2): qty 2

## 2019-04-01 MED ORDER — OXYCODONE HCL 5 MG PO TABS
5.0000 mg | ORAL_TABLET | ORAL | Status: DC | PRN
  Administered 2019-04-01 – 2019-04-03 (×3): 5 mg via ORAL
  Filled 2019-04-01 (×3): qty 1

## 2019-04-01 MED ORDER — ASPIRIN EC 81 MG PO TBEC
81.0000 mg | DELAYED_RELEASE_TABLET | Freq: Every day | ORAL | Status: DC
  Administered 2019-04-03 – 2019-04-04 (×2): 81 mg via ORAL
  Filled 2019-04-01 (×2): qty 1

## 2019-04-01 MED ORDER — SIMVASTATIN 20 MG PO TABS
20.0000 mg | ORAL_TABLET | Freq: Every day | ORAL | Status: DC
  Administered 2019-04-03: 20 mg via ORAL
  Filled 2019-04-01: qty 2
  Filled 2019-04-01: qty 1

## 2019-04-01 MED ORDER — ENOXAPARIN SODIUM 40 MG/0.4ML ~~LOC~~ SOLN
40.0000 mg | Freq: Every day | SUBCUTANEOUS | Status: DC
  Administered 2019-04-01 – 2019-04-03 (×2): 40 mg via SUBCUTANEOUS
  Filled 2019-04-01: qty 0.4

## 2019-04-01 MED ORDER — ONDANSETRON HCL 4 MG PO TABS: 4.0000 mg | ORAL_TABLET | Freq: Four times a day (QID) | ORAL | Status: DC | PRN

## 2019-04-01 MED ORDER — ACETAMINOPHEN 650 MG RE SUPP: 650.0000 mg | Freq: Four times a day (QID) | RECTAL | Status: DC | PRN

## 2019-04-01 MED ORDER — VANCOMYCIN HCL IN DEXTROSE 1-5 GM/200ML-% IV SOLN
1000.0000 mg | Freq: Once | INTRAVENOUS | Status: AC
  Administered 2019-04-01: 1000 mg via INTRAVENOUS
  Filled 2019-04-01: qty 200

## 2019-04-01 NOTE — ED Provider Notes (Signed)
[facility] Emergency Department Provider Note  ____________________________________________   First MD Initiated Contact with Patient 04/01/19 1923     (approximate)  I have reviewed the triage vital signs and the nursing notes.   HISTORY  Chief Complaint Toe Pain    HPI [name] is a 58 y.o. male presents emergency department complaint of right second toe pain for 1 week.  History of diabetes and is insulin-dependent.  Patient does also take pills for his diabetes.  History of osteomyelitis in which they have amputated some toes.  He denies any fever or chills.    Past Medical History:  Diagnosis Date  . Arthritis   . Bacterial infection due to Morganella morganii 12/26/2017  . BPH (benign prostatic hypertrophy)   . Diabetes mellitus   . Diabetic neuropathy ([place])   . Hypertension   . Metatarsal bone fracture    left 5th toe  . Osteomyelitis of toe of right foot ([place])   . Peripheral vascular disease ([place])   . Post-operative infection    and diabetic ulcer left foot  . Sepsis due to Streptococcus, group B ([place]) 12/26/2017  . Sleep apnea     does wear CPAP  . Wears glasses     Patient Active Problem List   Diagnosis Date Noted  . Chronic anticoagulationn (PLAVIX) 03/15/2018  . Diabetes mellitus due to underlying condition, uncontrolled, with diabetic neuropathy ([place]) 03/10/2018  . Type 2 diabetes mellitus with diabetic polyneuropathy, with long-term current use of insulin ([place]) 03/10/2018  . Diabetic polyneuropathy associated with type 2 diabetes mellitus ([place]) 03/10/2018  . Atherosclerosis of native arteries of the extremities with ulceration ([place]) 02/24/2018  . Sleep apnea 02/24/2018  . Diabetic peripheral neuropathy ([place]) 02/24/2018  . Vitamin D insufficiency 02/24/2018  . Neurogenic pain 02/24/2018  . Chronic foot pain (Primary Area of Pain) (Bilateral) (L>R) 02/10/2018  . Chronic lower extremity pain (Secondary Area of Pain)  (Bilateral) (L>R) 02/10/2018  . Chronic pain syndrome 02/10/2018  . Long term current use of opiate analgesic 02/10/2018  . Pharmacologic therapy 02/10/2018  . Disorder of skeletal system 02/10/2018  . Problems influencing health status 02/10/2018  . Bacterial infection due to Morganella morganii 12/26/2017  . Sepsis due to Streptococcus, group B ([place]) 12/26/2017  . HTN (hypertension) 11/29/2017  . HLD (hyperlipidemia) 11/29/2017  . Osteomyelitis ([place]) 11/29/2017  . Cellulitis of foot (Left) 03/05/2017  . Surgery, elective   . Hardware complicating wound infection ([place])   . Metatarsal stress fracture of left foot 01/12/2016  . Diabetic foot infection ([place]) 01/12/2016  . Diabetic ulcer of foot associated with type 2 diabetes mellitus, with necrosis of muscle ([place]) 01/11/2016    Past Surgical History:  Procedure Laterality Date  . AMPUTATION TOE Left    small toe  . COLONOSCOPY W/ BIOPSIES AND POLYPECTOMY    . I&D EXTREMITY Left 01/11/2016   Procedure: LEFT FOOT IRRIGATION AND DEBRIDEMENT WOUND VAC AND REMOVAL OF HARDWARE;  Surgeon: Wylene Simmer, MD;  Location: [facility];  Service: Orthopedics;  Laterality: Left;  . LOWER EXTREMITY ANGIOGRAPHY N/A 12/01/2017   Procedure: Lower Extremity Angiography;  Surgeon: Algernon Huxley, MD;  Location: [facility];  Service: Cardiovascular;  Laterality: N/A;  . LOWER EXTREMITY ANGIOGRAPHY Right 12/04/2017   Procedure: Lower Extremity Angiography;  Surgeon: Algernon Huxley, MD;  Location: [facility];  Service: Cardiovascular;  Laterality: Right;  . LOWER EXTREMITY ANGIOGRAPHY Left 03/12/2018   Procedure: LOWER EXTREMITY ANGIOGRAPHY;  Surgeon: Algernon Huxley, MD;  Location: [facility];  Service: Cardiovascular;  Laterality: Left;  . ORIF TOE FRACTURE Left 06/08/2015   Procedure: OPEN REDUCTION INTERNAL FIXATION (ORIF) LEFT FIFTH METATARSAL BASE FRACTURE NONUNION; CALCANEAL AUTOGRAFT ;  Surgeon: Wylene Simmer, MD;  Location: [facility];  Service: Orthopedics;  Laterality: Left;  . PATELLA RECONSTRUCTION Left 2005  . PROSTATE ABLATION  2014  . TOE AMPUTATION     partial amputation right great toe  . WOUND DEBRIDEMENT Bilateral 11/30/2017   Procedure: DEBRIDEMENT WOUND;  Surgeon: Sharlotte Alamo, DPM;  Location: [facility];  Service: Podiatry;  Laterality: Bilateral;    Prior to Admission medications   Medication Sig Start Date End Date Taking? Authorizing Provider  aspirin EC 81 MG tablet Take 81 mg by mouth daily.    [provider]  atorvastatin (LIPITOR) 10 MG tablet Take 1 tablet (10 mg total) by mouth daily. 03/12/18 03/12/19  Algernon Huxley, MD  Calcium Carbonate-Vit D-Min (GNP CALCIUM 1200) 1200-1000 MG-UNIT CHEW Chew 1,200 mg by mouth daily with breakfast. Take in combination with vitamin D and magnesium. 03/16/18 09/12/18  Milinda Pointer, MD  clopidogrel (PLAVIX) 75 MG tablet Take 1 tablet (75 mg total) by mouth daily. 03/12/18   Algernon Huxley, MD  HUMALOG KWIKPEN 100 UNIT/ML KiwkPen Inject 0-15 Units into the skin 3 (three) times daily. MAX-45 units a day 01/06/18   [provider]  Insulin Glargine (BASAGLAR KWIKPEN) 100 UNIT/ML SOPN Inject 0.35 mLs (35 Units total) into the skin at bedtime. 05/21/17   Renato Shin, MD  losartan (COZAAR) 50 MG tablet Take 50 mg by mouth daily.    [provider]  metFORMIN (GLUCOPHAGE) 1000 MG tablet Take 1,000 mg by mouth 2 (two) times daily. 10/21/15   [provider]    Allergies Bactrim [sulfamethoxazole-trimethoprim]  Family History  Problem Relation Age of Onset  . Diabetes Mother   . Diabetes Other     Social History Social History   Tobacco Use  . Smoking status: Never Smoker  . Smokeless tobacco: Never Used  Substance Use Topics  . Alcohol use: Yes    Comment: occasional   . Drug use: Yes    Types: Marijuana, Cocaine    Comment: last month    Review of Systems  Constitutional: No fever/chills Eyes: No visual changes.  ENT: No sore throat. Respiratory: Denies cough Genitourinary: Negative for dysuria. Musculoskeletal: Negative for back pain.  Right second toe pain Skin: Negative for rash.    ____________________________________________   PHYSICAL EXAM:  VITAL SIGNS: ED Triage Vitals  Enc Vitals Group     BP 04/01/19 1912 (!) 157/98     Pulse Rate 04/01/19 1912 (!) 107     Resp 04/01/19 1912 20     Temp 04/01/19 1912 99.5 F (37.5 C)     Temp Source 04/01/19 1912 Oral     SpO2 04/01/19 1913 100 %     Weight 04/01/19 1913 235 lb (106.6 kg)     Height 04/01/19 1913 5\' 10"  (1.778 m)     Head Circumference --      Peak Flow --      Pain Score 04/01/19 1913 10     Pain Loc --      Pain Edu? --      Excl. in [place]? --     Constitutional: Alert and oriented. Well appearing and in no acute distress. Eyes: Conjunctivae are normal.  Head: Atraumatic. Nose:  No congestion/rhinnorhea. Mouth/Throat: Mucous membranes are moist.   Neck:  supple no lymphadenopathy noted Cardiovascular: Normal rate, regular rhythm. Heart sounds are normal Respiratory: Normal respiratory effort.  No retractions, lungs c t a  GU: deferred Musculoskeletal: Right second toe is grossly swollen with a odor similar to Pseudomonas.  The distal portion of the toes very fluctuant.   Neurologic:  Normal speech and language.  Skin:  Skin is warm, dry, excoriations noted on the right second toe. No rash noted. Psychiatric: Mood and affect are normal. Speech and behavior are normal.  ____________________________________________   LABS (all labs ordered are listed, but only abnormal results are displayed)  Labs Reviewed  SARS CORONAVIRUS 2 (HOSPITAL ORDER, [facility])  CBC WITH DIFFERENTIAL/PLATELET  COMPREHENSIVE METABOLIC PANEL   ____________________________________________   ____________________________________________  RADIOLOGY  X-ray of the right foot shows osteomyelitis of the right  second distal toe  ____________________________________________   PROCEDURES  Procedure(s) performed: Saline lock, vancomycin IV   Procedures    ____________________________________________   INITIAL IMPRESSION / ASSESSMENT AND PLAN / ED COURSE  Pertinent labs & imaging results that were available during my care of the patient were reviewed by me and considered in my medical decision making (see chart for details).   Patient is 58 year old male presents emergency department with complaints of right second toe pain.  History of insulin-dependent diabetes.  Physical exam shows the right second toe to be a fluid filled and fluctuant distally, has a distinct odor similar to Pseudomonas  X-ray of the right foot shows osteomyelitis of the right second toe  Dr. Joni Fears and see the patient.  Patient be admitted to medicine for them to consult podiatry.  He is calling the hospitalist.  Patient was given vancomycin IV, labs are still pending    [name] was evaluated in Emergency Department on 04/01/2019 for the symptoms described in the history of present illness. He was evaluated in the context of the global COVID-19 pandemic, which necessitated consideration that the patient might be at risk for infection with the SARS-CoV-2 virus that causes COVID-19. Institutional protocols and algorithms that pertain to the evaluation of patients at risk for COVID-19 are in a state of rapid change based on information released by regulatory bodies including the CDC and federal and state organizations. These policies and algorithms were followed during the patient's care in the ED.   As part of my medical decision making, I reviewed the following data within the [place] notes reviewed and incorporated, Labs reviewed see above, Old chart reviewed, Radiograph reviewed x-ray of the right foot shows osteomyelitis, Evaluated by EM attending Dr. Joni Fears, Notes from prior ED  visits and [place] Controlled Substance Database  ____________________________________________   FINAL CLINICAL IMPRESSION(S) / ED DIAGNOSES  Final diagnoses:  Osteomyelitis of second toe of right foot ([place])      NEW MEDICATIONS STARTED DURING THIS VISIT:  New Prescriptions   No medications on file     Note:  This document was prepared using Dragon voice recognition software and may include unintentional dictation errors.    Versie Starks, PA-C 04/01/19 2051    Carrie Mew, MD 04/01/19 2108

## 2019-04-01 NOTE — Plan of Care (Signed)
Initiated

## 2019-04-01 NOTE — ED Triage Notes (Signed)
Pt in with co pain to right 2nd toe for a week, denies any injury.

## 2019-04-01 NOTE — H&P (Signed)
[facility] [place] NAME: [name]    MR#:  [id number]  DATE OF BIRTH:  06-Oct-1960  DATE OF ADMISSION:  04/01/2019  PRIMARY CARE PHYSICIAN: Lorelee Market, MD   REQUESTING/REFERRING PHYSICIAN: Joni Fears, MD  CHIEF COMPLAINT:   Chief Complaint  Patient presents with  . Toe Pain    HISTORY OF PRESENT ILLNESS:  [name]  is a 58 y.o. male who presents with chief complaint as above.  Patient presents the ED with a complaint of toe pain on his right foot.  He has an area of injury on his toe, and imaging tonight shows concern for osteomyelitis.  IV antibiotics started and hospitalist called for admission  PAST MEDICAL HISTORY:   Past Medical History:  Diagnosis Date  . Arthritis   . Bacterial infection due to Morganella morganii 12/26/2017  . BPH (benign prostatic hypertrophy)   . Diabetes mellitus   . Diabetic neuropathy ([place])   . Hypertension   . Metatarsal bone fracture    left 5th toe  . Osteomyelitis of toe of right foot ([place])   . Peripheral vascular disease ([place])   . Post-operative infection    and diabetic ulcer left foot  . Sepsis due to Streptococcus, group B ([place]) 12/26/2017  . Sleep apnea     does wear CPAP  . Wears glasses      PAST SURGICAL HISTORY:   Past Surgical History:  Procedure Laterality Date  . AMPUTATION TOE Left    small toe  . COLONOSCOPY W/ BIOPSIES AND POLYPECTOMY    . I&D EXTREMITY Left 01/11/2016   Procedure: LEFT FOOT IRRIGATION AND DEBRIDEMENT WOUND VAC AND REMOVAL OF HARDWARE;  Surgeon: Wylene Simmer, MD;  Location: [facility];  Service: Orthopedics;  Laterality: Left;  . LOWER EXTREMITY ANGIOGRAPHY N/A 12/01/2017   Procedure: Lower Extremity Angiography;  Surgeon: Algernon Huxley, MD;  Location: [facility];  Service: Cardiovascular;  Laterality: N/A;  . LOWER EXTREMITY ANGIOGRAPHY Right 12/04/2017   Procedure: Lower Extremity Angiography;  Surgeon: Algernon Huxley, MD;  Location:  [facility];  Service: Cardiovascular;  Laterality: Right;  . LOWER EXTREMITY ANGIOGRAPHY Left 03/12/2018   Procedure: LOWER EXTREMITY ANGIOGRAPHY;  Surgeon: Algernon Huxley, MD;  Location: [facility];  Service: Cardiovascular;  Laterality: Left;  . ORIF TOE FRACTURE Left 06/08/2015   Procedure: OPEN REDUCTION INTERNAL FIXATION (ORIF) LEFT FIFTH METATARSAL BASE FRACTURE NONUNION; CALCANEAL AUTOGRAFT ;  Surgeon: Wylene Simmer, MD;  Location: [facility];  Service: Orthopedics;  Laterality: Left;  . PATELLA RECONSTRUCTION Left 2005  . PROSTATE ABLATION  2014  . TOE AMPUTATION     partial amputation right great toe  . WOUND DEBRIDEMENT Bilateral 11/30/2017   Procedure: DEBRIDEMENT WOUND;  Surgeon: Sharlotte Alamo, DPM;  Location: [facility];  Service: Podiatry;  Laterality: Bilateral;     SOCIAL HISTORY:   Social History   Tobacco Use  . Smoking status: Never Smoker  . Smokeless tobacco: Never Used  Substance Use Topics  . Alcohol use: Yes    Comment: occasional      FAMILY HISTORY:   Family History  Problem Relation Age of Onset  . Diabetes Mother   . Diabetes Other      DRUG ALLERGIES:   Allergies  Allergen Reactions  . Bactrim [Sulfamethoxazole-Trimethoprim] Hives    MEDICATIONS AT HOME:   Prior to Admission medications   Medication Sig Start Date End Date Taking? Authorizing  Provider  amLODipine (NORVASC) 10 MG tablet Take 10 mg by mouth daily. 03/27/19  Yes [provider]  aspirin EC 81 MG tablet Take 81 mg by mouth daily.   Yes [provider]  hydrochlorothiazide (HYDRODIURIL) 25 MG tablet Take 25 mg by mouth daily. 03/07/19  Yes [provider]  Insulin Glargine (BASAGLAR KWIKPEN) 100 UNIT/ML SOPN Inject 0.35 mLs (35 Units total) into the skin at bedtime. Patient taking differently: Inject 30 Units into the skin at bedtime.  05/21/17  Yes Renato Shin, MD  losartan (COZAAR) 100 MG tablet Take 100 mg by mouth daily.     Yes [provider]  metFORMIN (GLUCOPHAGE) 1000 MG tablet Take 1,000 mg by mouth 2 (two) times daily. 10/21/15  Yes [provider]  simvastatin (ZOCOR) 20 MG tablet Take 20 mg by mouth daily. 02/23/19  Yes [provider]  Calcium Carbonate-Vit D-Min (GNP CALCIUM 1200) 1200-1000 MG-UNIT CHEW Chew 1,200 mg by mouth daily with breakfast. Take in combination with vitamin D and magnesium. 03/16/18 09/12/18  Milinda Pointer, MD    REVIEW OF SYSTEMS:  Review of Systems  Constitutional: Negative for chills, fever, malaise/fatigue and weight loss.  HENT: Negative for ear pain, hearing loss and tinnitus.   Eyes: Negative for blurred vision, double vision, pain and redness.  Respiratory: Negative for cough, hemoptysis and shortness of breath.   Cardiovascular: Negative for chest pain, palpitations, orthopnea and leg swelling.  Gastrointestinal: Negative for abdominal pain, constipation, diarrhea, nausea and vomiting.  Genitourinary: Negative for dysuria, frequency and hematuria.  Musculoskeletal: Negative for back pain, joint pain and neck pain.  Skin:       Right toe ulceration with swelling and discoloration  Neurological: Negative for dizziness, tremors, focal weakness and weakness.  Endo/Heme/Allergies: Negative for polydipsia. Does not bruise/bleed easily.  Psychiatric/Behavioral: Negative for depression. The patient is not nervous/anxious and does not have insomnia.      VITAL SIGNS:   Vitals:   04/01/19 1912 04/01/19 1913  BP: (!) 157/98   Pulse: (!) 107   Resp: 20   Temp: 99.5 F (37.5 C)   TempSrc: Oral   SpO2:  100%  Weight:  106.6 kg  Height:  5\' 10"  (1.778 m)   Wt Readings from Last 3 Encounters:  04/01/19 106.6 kg  06/22/18 114.3 kg  04/30/18 108.9 kg    PHYSICAL EXAMINATION:  Physical Exam  Vitals reviewed. Constitutional: He is oriented to person, place, and time. He appears well-developed and well-nourished. No distress.  HENT:  Head:  Normocephalic and atraumatic.  Mouth/Throat: Oropharynx is clear and moist.  Eyes: Pupils are equal, round, and reactive to light. Conjunctivae and EOM are normal. No scleral icterus.  Neck: Normal range of motion. Neck supple. No JVD present. No thyromegaly present.  Cardiovascular: Normal rate, regular rhythm and intact distal pulses. Exam reveals no gallop and no friction rub.  No murmur heard. Respiratory: Effort normal and breath sounds normal. No respiratory distress. He has no wheezes. He has no rales.  GI: Soft. Bowel sounds are normal. He exhibits no distension. There is no abdominal tenderness.  Musculoskeletal: Normal range of motion.        General: No edema.     Comments: Right toe lesion as described in HPI and review of systems  Lymphadenopathy:    He has no cervical adenopathy.  Neurological: He is alert and oriented to person, place, and time. No cranial nerve deficit.  No dysarthria, no aphasia  Skin: Skin is  warm and dry. No rash noted. No erythema.  Psychiatric: He has a normal mood and affect. His behavior is normal. Judgment and thought content normal.    LABORATORY PANEL:   CBC Recent Labs  Lab 04/01/19 2018  WBC 12.6*  HGB 12.2*  HCT 35.9*  PLT 244   ------------------------------------------------------------------------------------------------------------------  Chemistries  Recent Labs  Lab 04/01/19 2018  NA 136  K 3.8  CL 97*  CO2 29  GLUCOSE 120*  BUN 18  CREATININE 1.02  CALCIUM 9.3  AST 15  ALT 14  ALKPHOS 95  BILITOT 0.9   ------------------------------------------------------------------------------------------------------------------  Cardiac Enzymes No results for input(s): TROPONINI in the last 168 hours. ------------------------------------------------------------------------------------------------------------------  RADIOLOGY:  Dg Foot Complete Right  Result Date: 04/01/2019 CLINICAL DATA:  Infection and swelling of the  right second toe, denies injury EXAM: RIGHT FOOT COMPLETE - 3+ VIEW COMPARISON:  Radiograph 06/26/2015 FINDINGS: Postsurgical changes from prior fifth transmetatarsal amputation and first interphalangeal amputation. There is diffuse soft tissue swelling of the forefoot and more focal tissue swelling and ulceration at the tip of the second digit with subjacent osseous erosion and destructive changes involving the tuft of the second distal phalanx. No other concerning osseous erosions or cortical destruction is seen. Clawtoe deformities of the second through fourth digits are noted. Degenerative changes are noted in the midfoot and to a lesser extent hindfoot. Plantar calcaneal spur is noted. Vascular calcifications noted in the soft tissues. IMPRESSION: 1. Diffuse soft tissue swelling of the forefoot with more focal ulceration at the tip of the second digit. Osseous findings are consistent with osteomyelitis of the tuft of the second distal phalanx. 2. Postsurgical changes from prior fifth transmetatarsal amputation and first interphalangeal amputation. Electronically Signed   By: Lovena Le M.D.   On: 04/01/2019 19:56    EKG:   Orders placed or performed during the hospital encounter of 04/30/18  . ED EKG  . ED EKG  . EKG    IMPRESSION AND PLAN:  Principal Problem:   Osteomyelitis ([place]) -of right toe, IV antibiotics, podiatry consult Active Problems:   HTN (hypertension) -home dose antihypertensives   Type 2 diabetes mellitus with diabetic polyneuropathy, with long-term current use of insulin (HCC) -sliding scale insulin   HLD (hyperlipidemia) -home dose antilipid   Sleep apnea -CPAP nightly  Chart review performed and case discussed with ED provider. Labs, imaging and/or ECG reviewed by provider and discussed with patient/family. Management plans discussed with the patient and/or family.  COVID-19 status: Pending  DVT PROPHYLAXIS: SubQ lovenox   GI PROPHYLAXIS:  None  ADMISSION  STATUS: Inpatient     CODE STATUS: Full Code Status History    Date Active Date Inactive Code Status Order ID Comments User Context   03/12/2018 [id number] 03/12/2018 1531 Full Code [id number]  Algernon Huxley, MD Inpatient   11/29/2017 0218 12/05/2017 1511 Full Code [id number]  Lance Coon, MD Inpatient   03/05/2017 1831 03/07/2017 1914 Full Code [id number]  Hillary Bow, MD ED   01/11/2016 1845 01/15/2016 1916 Full Code [id number]  Wylene Simmer, MD Inpatient   Advance Care Planning Activity      TOTAL TIME TAKING CARE OF THIS PATIENT: 45 minutes.   This patient was evaluated in the context of the global COVID-19 pandemic, which necessitated consideration that the patient might be at risk for infection with the SARS-CoV-2 virus that causes COVID-19. Institutional protocols and algorithms that pertain to the evaluation of patients at risk for COVID-19 are in a state of rapid  change based on information released by regulatory bodies including the CDC and federal and state organizations. These policies and algorithms were followed to the best of this provider's knowledge to date during the patient's care at this facility.  Ethlyn Daniels 04/01/2019, 9:33 PM  [facility]  Office  [phone]  CC: Primary care physician; Lorelee Market, MD  Note:  This document was prepared using Dragon voice recognition software and may include unintentional dictation errors.

## 2019-04-02 ENCOUNTER — Encounter: Payer: Self-pay | Admitting: Anesthesiology

## 2019-04-02 ENCOUNTER — Inpatient Hospital Stay: Payer: Medicare HMO | Admitting: Anesthesiology

## 2019-04-02 ENCOUNTER — Encounter
Admission: EM | Disposition: A | Discharge status: Home / Self Care | Payer: Self-pay | Source: Home / Self Care | Attending: Internal Medicine

## 2019-04-02 HISTORY — PX: AMPUTATION TOE: SHX6595

## 2019-04-02 LAB — BASIC METABOLIC PANEL
Anion gap: 10 (ref 5–15)
BUN: 17 mg/dL (ref 6–20)
CO2: 28 mmol/L (ref 22–32)
Calcium: 9.1 mg/dL (ref 8.9–10.3)
Chloride: 99 mmol/L (ref 98–111)
Creatinine, Ser: 0.94 mg/dL (ref 0.61–1.24)
GFR calc Af Amer: >60 mL/min (ref >60)
GFR calc non Af Amer: >60 mL/min (ref >60)
Glucose, Bld: 160 mg/dL — ABNORMAL HIGH (ref 70–99)
Potassium: 3.7 mmol/L (ref 3.5–5.1)
Sodium: 137 mmol/L (ref 135–145)

## 2019-04-02 LAB — GLUCOSE, CAPILLARY
Glucose-Capillary: 105 mg/dL — ABNORMAL HIGH (ref 70–99)
Glucose-Capillary: 127 mg/dL — ABNORMAL HIGH (ref 70–99)
Glucose-Capillary: 144 mg/dL — ABNORMAL HIGH (ref 70–99)
Glucose-Capillary: 165 mg/dL — ABNORMAL HIGH (ref 70–99)
Glucose-Capillary: 214 mg/dL — ABNORMAL HIGH (ref 70–99)

## 2019-04-02 LAB — URINE DRUG SCREEN, QUALITATIVE (ARMC ONLY)
Amphetamines, Ur Screen: NOT DETECTED
Barbiturates, Ur Screen: NOT DETECTED
Benzodiazepine, Ur Scrn: NOT DETECTED
Cannabinoid 50 Ng, Ur ~~LOC~~: NOT DETECTED
Cocaine Metabolite,Ur ~~LOC~~: NOT DETECTED
MDMA (Ecstasy)Ur Screen: NOT DETECTED
Methadone Scn, Ur: NOT DETECTED
Opiate, Ur Screen: NOT DETECTED
Phencyclidine (PCP) Ur S: NOT DETECTED
Tricyclic, Ur Screen: NOT DETECTED

## 2019-04-02 LAB — HEMOGLOBIN A1C
Hgb A1c MFr Bld: 7.7 % — ABNORMAL HIGH (ref 4.8–5.6)
Mean Plasma Glucose: 174.29 mg/dL

## 2019-04-02 LAB — CBC
HCT: 33.9 % — ABNORMAL LOW (ref 39.0–52.0)
Hemoglobin: 11.5 g/dL — ABNORMAL LOW (ref 13.0–17.0)
MCH: 31.2 pg (ref 26.0–34.0)
MCHC: 33.9 g/dL (ref 30.0–36.0)
MCV: 91.9 fL (ref 80.0–100.0)
Platelets: 240 10*3/uL (ref 150–400)
RBC: 3.69 MIL/uL — ABNORMAL LOW (ref 4.22–5.81)
RDW: 12.7 % (ref 11.5–15.5)
WBC: 9.7 10*3/uL (ref 4.0–10.5)
nRBC: 0 % (ref 0.0–0.2)

## 2019-04-02 LAB — HIV ANTIBODY (ROUTINE TESTING W REFLEX): HIV Screen 4th Generation wRfx: NONREACTIVE

## 2019-04-02 LAB — SEDIMENTATION RATE: Sed Rate: 66 mm/hr — ABNORMAL HIGH (ref 0–20)

## 2019-04-02 SURGERY — AMPUTATION, TOE
Anesthesia: Monitor Anesthesia Care | Site: Toe | Laterality: Right

## 2019-04-02 MED ORDER — PROPOFOL 10 MG/ML IV BOLUS
INTRAVENOUS | Status: DC | PRN
  Administered 2019-04-02: 50 mg via INTRAVENOUS

## 2019-04-02 MED ORDER — PROPOFOL 500 MG/50ML IV EMUL
INTRAVENOUS | Status: AC
  Filled 2019-04-02: qty 50

## 2019-04-02 MED ORDER — SODIUM CHLORIDE 0.9 % IV SOLN
INTRAVENOUS | Status: DC | PRN
  Administered 2019-04-02: 20:00:00 via INTRAVENOUS

## 2019-04-02 MED ORDER — LIDOCAINE HCL (PF) 1 % IJ SOLN
INTRAMUSCULAR | Status: DC | PRN
  Administered 2019-04-02: 4.5 mL

## 2019-04-02 MED ORDER — VANCOMYCIN HCL IN DEXTROSE 1-5 GM/200ML-% IV SOLN
1000.0000 mg | Freq: Two times a day (BID) | INTRAVENOUS | Status: DC
  Administered 2019-04-02 – 2019-04-03 (×3): 1000 mg via INTRAVENOUS
  Filled 2019-04-02 (×6): qty 200

## 2019-04-02 MED ORDER — SODIUM CHLORIDE 0.9 % IV SOLN
INTRAVENOUS | Status: DC | PRN
  Administered 2019-04-02 – 2019-04-03 (×2): 500 mL via INTRAVENOUS

## 2019-04-02 MED ORDER — CHLORHEXIDINE GLUCONATE 4 % EX LIQD: 60.0000 mL | Freq: Once | CUTANEOUS | Status: DC

## 2019-04-02 MED ORDER — PROPOFOL 10 MG/ML IV BOLUS
INTRAVENOUS | Status: AC
  Filled 2019-04-02: qty 20

## 2019-04-02 MED ORDER — BUPIVACAINE HCL 0.5 % IJ SOLN
INTRAMUSCULAR | Status: DC | PRN
  Administered 2019-04-02: 4.5 mL

## 2019-04-02 MED ORDER — SODIUM CHLORIDE 0.9 % IV SOLN
2.0000 g | Freq: Three times a day (TID) | INTRAVENOUS | Status: DC
  Administered 2019-04-02 – 2019-04-04 (×7): 2 g via INTRAVENOUS
  Filled 2019-04-02 (×10): qty 2

## 2019-04-02 MED ORDER — ENSURE PRE-SURGERY PO LIQD
296.0000 mL | Freq: Once | ORAL | Status: AC
  Administered 2019-04-02: 296 mL via ORAL
  Filled 2019-04-02: qty 296

## 2019-04-02 MED ORDER — PROPOFOL 500 MG/50ML IV EMUL
INTRAVENOUS | Status: DC | PRN
  Administered 2019-04-02: 125 ug/kg/min via INTRAVENOUS

## 2019-04-02 MED ORDER — POVIDONE-IODINE 10 % EX SWAB: 2.0000 "application " | Freq: Once | CUTANEOUS | Status: DC

## 2019-04-02 SURGICAL SUPPLY — 45 items
BLADE OSC/SAGITTAL MD 5.5X18 (BLADE) ×2 IMPLANT
BLADE SURG MINI STRL (BLADE) ×2 IMPLANT
BNDG CONFORM 2 STRL LF (GAUZE/BANDAGES/DRESSINGS) ×2 IMPLANT
BNDG CONFORM 3 STRL LF (GAUZE/BANDAGES/DRESSINGS) ×4 IMPLANT
BNDG ELASTIC 4X5.8 VLCR NS LF (GAUZE/BANDAGES/DRESSINGS) ×2 IMPLANT
BNDG ESMARK 4X12 TAN STRL LF (GAUZE/BANDAGES/DRESSINGS) ×2 IMPLANT
BNDG GAUZE 4.5X4.1 6PLY STRL (MISCELLANEOUS) ×2 IMPLANT
CANISTER SUCT 1200ML W/VALVE (MISCELLANEOUS) ×2 IMPLANT
COVER WAND RF STERILE (DRAPES) ×2 IMPLANT
CUFF TOURN SGL QUICK 12 (TOURNIQUET CUFF) IMPLANT
CUFF TOURN SGL QUICK 18X4 (TOURNIQUET CUFF) IMPLANT
DRAPE FLUOR MINI C-ARM 54X84 (DRAPES) ×2 IMPLANT
DRAPE XRAY CASSETTE 23X24 (DRAPES) ×2 IMPLANT
DURAPREP 26ML APPLICATOR (WOUND CARE) ×2 IMPLANT
ELECT REM PT RETURN 9FT ADLT (ELECTROSURGICAL) ×2
ELECTRODE REM PT RTRN 9FT ADLT (ELECTROSURGICAL) ×1 IMPLANT
GAUZE PACKING IODOFORM 1/2 (PACKING) ×2 IMPLANT
GAUZE SPONGE 4X4 12PLY STRL (GAUZE/BANDAGES/DRESSINGS) ×2 IMPLANT
GAUZE XEROFORM 1X8 LF (GAUZE/BANDAGES/DRESSINGS) ×2 IMPLANT
GLOVE BIO SURGEON STRL SZ7.5 (GLOVE) ×2 IMPLANT
GLOVE INDICATOR 8.0 STRL GRN (GLOVE) ×2 IMPLANT
GOWN STRL REUS W/ TWL LRG LVL3 (GOWN DISPOSABLE) ×2 IMPLANT
GOWN STRL REUS W/TWL LRG LVL3 (GOWN DISPOSABLE) ×2
KIT TURNOVER KIT A (KITS) ×2 IMPLANT
LABEL OR SOLS (LABEL) ×2 IMPLANT
NDL FILTER BLUNT 18X1 1/2 (NEEDLE) ×1 IMPLANT
NDL HYPO 25X1 1.5 SAFETY (NEEDLE) ×1 IMPLANT
NEEDLE FILTER BLUNT 18X 1/2SAF (NEEDLE) ×1
NEEDLE FILTER BLUNT 18X1 1/2 (NEEDLE) ×1 IMPLANT
NEEDLE HYPO 25X1 1.5 SAFETY (NEEDLE) ×2 IMPLANT
NS IRRIG 500ML POUR BTL (IV SOLUTION) ×2 IMPLANT
PACK EXTREMITY ARMC (MISCELLANEOUS) ×2 IMPLANT
PAD ABD DERMACEA PRESS 5X9 (GAUZE/BANDAGES/DRESSINGS) ×4 IMPLANT
PULSAVAC PLUS IRRIG FAN TIP (DISPOSABLE) ×2
SHIELD FULL FACE ANTIFOG 7M (MISCELLANEOUS) ×2 IMPLANT
SOL .9 NS 3000ML IRR  AL (IV SOLUTION) ×1
SOL .9 NS 3000ML IRR UROMATIC (IV SOLUTION) ×1 IMPLANT
STOCKINETTE M/LG 89821 (MISCELLANEOUS) ×2 IMPLANT
STRAP SAFETY 5IN WIDE (MISCELLANEOUS) ×2 IMPLANT
SUT ETHILON 3-0 FS-10 30 BLK (SUTURE) ×2
SUT ETHILON 5-0 FS-2 18 BLK (SUTURE) ×2 IMPLANT
SUT VIC AB 4-0 FS2 27 (SUTURE) ×2 IMPLANT
SUTURE EHLN 3-0 FS-10 30 BLK (SUTURE) ×1 IMPLANT
SYR 10ML LL (SYRINGE) ×6 IMPLANT
TIP FAN IRRIG PULSAVAC PLUS (DISPOSABLE) ×1 IMPLANT

## 2019-04-02 NOTE — Consult Note (Signed)
Pharmacy Antibiotic Note  [name] is a 58 y.o. male admitted on 04/01/2019 with osteomyelitis.  Pharmacy has been consulted for Vancomycin/Cefepime dosing.  Plan: Will start Cefepime 2g q8h  Will start  Vancomycin 1000 mg IV Q 12 hrs. Goal AUC 400-550. Expected AUC: 476 SCr used: 0.94   Height: 5\' 10"  (177.8 cm) Weight: 235 lb (106.6 kg) IBW/kg (Calculated) : 73  Temp (24hrs), Avg:99.5 F (37.5 C), Min:99.5 F (37.5 C), Max:99.5 F (37.5 C)  Recent Labs  Lab 04/01/19 2018 04/02/19 0523  WBC 12.6* 9.7  CREATININE 1.02 0.94    Estimated Creatinine Clearance: 106 mL/min (by C-G formula based on SCr of 0.94 mg/dL).    Allergies  Allergen Reactions  . Bactrim [Sulfamethoxazole-Trimethoprim] Hives    Antimicrobials this admission: 10/01 Zosyn x 1 10/01 Vancomycin >> 10/02 Cefepime >>   Dose adjustments this admission: None  Microbiology results: COVID NEG No other cultures obtained at this time.  Thank you for allowing pharmacy to be a part of this patient's care.  Lu Duffel, PharmD, BCPS Clinical Pharmacist 04/02/2019 7:31 AM

## 2019-04-02 NOTE — Anesthesia Postprocedure Evaluation (Signed)
Anesthesia Post Note  Patient: [name]  Procedure(s) Performed: AMPUTATION TOE 2ND (Right Toe)  Patient location during evaluation: PACU Anesthesia Type: MAC Level of consciousness: awake and alert Pain management: pain level controlled Vital Signs Assessment: post-procedure vital signs reviewed and stable Respiratory status: spontaneous breathing, nonlabored ventilation, respiratory function stable and patient connected to nasal cannula oxygen Cardiovascular status: stable and blood pressure returned to baseline Postop Assessment: no apparent nausea or vomiting Anesthetic complications: no     Last Vitals:  Vitals:   04/02/19 2150 04/02/19 2158  BP: 139/80 133/81  Pulse: 87 89  Resp: (!) 22 19  Temp:  36.7 C  SpO2: 97% 98%    Last Pain:  Vitals:   04/02/19 2158  TempSrc: Oral  PainSc:                  Gustav Knueppel S

## 2019-04-02 NOTE — Transfer of Care (Signed)
Immediate Anesthesia Transfer of Care Note  Patient: [name]  Procedure(s) Performed: AMPUTATION TOE 2ND (Right Toe)  Patient Location: PACU  Anesthesia Type:General  Level of Consciousness: awake, alert  and oriented  Airway & Oxygen Therapy: Patient Spontanous Breathing  Post-op Assessment: Report given to RN and Post -op Vital signs reviewed and stable  Post vital signs: Reviewed and stable  Last Vitals:  Vitals Value Taken Time  BP    Temp    Pulse    Resp    SpO2      Last Pain:  Vitals:   04/02/19 2006  TempSrc:   PainSc: 0-No pain      Patients Stated Pain Goal: 1 ([phone] [id number])  Complications: No apparent anesthesia complications

## 2019-04-02 NOTE — Anesthesia Post-op Follow-up Note (Signed)
Anesthesia QCDR form completed.        

## 2019-04-02 NOTE — Anesthesia Preprocedure Evaluation (Signed)
Anesthesia Evaluation  Patient identified by MRN, date of birth, ID band Patient awake    Reviewed: Allergy & Precautions, NPO status , Patient's Chart, lab work & pertinent test results, reviewed documented beta blocker date and time   Airway Mallampati: III  TM Distance: >3 FB     Dental  (+) Upper Dentures, Lower Dentures   Pulmonary sleep apnea and Continuous Positive Airway Pressure Ventilation ,           Cardiovascular hypertension, Pt. on medications + Peripheral Vascular Disease       Neuro/Psych  Neuromuscular disease    GI/Hepatic   Endo/Other  diabetes, Type 2  Renal/GU      Musculoskeletal  (+) Arthritis ,   Abdominal   Peds  Hematology   Anesthesia Other Findings Obese. Chronic pain. PACs on EKG.  Reproductive/Obstetrics                             Anesthesia Physical Anesthesia Plan  ASA: III  Anesthesia Plan: MAC   Post-op Pain Management:    Induction: Intravenous  PONV Risk Score and Plan:   Airway Management Planned:   Additional Equipment:   Intra-op Plan:   Post-operative Plan:   Informed Consent: I have reviewed the patients History and Physical, chart, labs and discussed the procedure including the risks, benefits and alternatives for the proposed anesthesia with the patient or authorized representative who has indicated his/her understanding and acceptance.       Plan Discussed with: CRNA  Anesthesia Plan Comments:         Anesthesia Quick Evaluation

## 2019-04-02 NOTE — Progress Notes (Signed)
Pt stated wife will bring Cpap from home.

## 2019-04-02 NOTE — Op Note (Signed)
Operative note   Surgeon:Adlean Hardeman Lawyer: None    Preop diagnosis: Osteomyelitis with abscess distal right second toe    Postop diagnosis: Same    Procedure: Amputation right second toe    EBL: Minimal    Anesthesia:local and MAC.  Local consisted of a one-to-one mixture of 0.5% bupivacaine and 1% lidocaine plain    Hemostasis: Ankle tourniquet inflated to 200 mmHg for 9 minutes    Specimen: Distal osteomyelitis right second toe with amputation    Complications: None    Operative indications:[name] is an 58 y.o. that presents today for surgical intervention.  The risks/benefits/alternatives/complications have been discussed and consent has been given.    Procedure:  Patient was brought into the OR and placed on the operating table in thesupine position. After anesthesia was obtained theright lower extremity was prepped and draped in usual sterile fashion.  Attention was directed to the right second toe.  [place] incision was made at the level of the PIPJ.  Full-thickness skin flaps were created dorsally and plantarly.  Of note the plantar and lateral skin of the second toe had been to removed and there was noted dermal skin without epidermis to this level.  Healthy perfusion was noted.  Next with a power saw the midshaft of the proximal phalanx was osteotomized.  The toe was then removed from the surgical field in toto.  The proximal margin was inked.  A deep wound culture was performed at this level.  The wound was flushed with copious amounts of irrigation.  Closure was performed with a 4-0 nylon.  Bulky sterile dressing was applied.    Patient tolerated the procedure and anesthesia well.  Was transported from the OR to the PACU with all vital signs stable and vascular status intact. To be discharged per routine protocol.  Will follow up in approximately 1 week in the outpatient clinic.

## 2019-04-02 NOTE — Progress Notes (Signed)
Patient ID: [name], male   DOB: 09/16/1960, 58 y.o.   MRN: [id number]  [facility] PROGRESS NOTE  [name] [id number] DOB: 1960-09-10 DOA: 04/01/2019 PCP: Theotis Burrow, MD  HPI/Subjective: Patient coming in with right second toe pain and swelling.  He does not recall how long it is been swollen.  He was found to have osteomyelitis on x-ray.  Objective: Vitals:   04/02/19 0826 04/02/19 1517  BP: 131/75 (!) 142/90  Pulse: 85 88  Resp:    Temp: 98 F (36.7 C) 98.6 F (37 C)  SpO2: 97% 100%    Intake/Output Summary (Last 24 hours) at 04/02/2019 1600 Last data filed at 04/02/2019 1100 Gross per 24 hour  Intake 196.02 ml  Output 500 ml  Net -303.98 ml   Filed Weights   04/01/19 1913  Weight: 106.6 kg    ROS: Review of Systems  Constitutional: Negative for chills and fever.  Eyes: Negative for blurred vision.  Respiratory: Negative for cough and shortness of breath.   Cardiovascular: Negative for chest pain.  Gastrointestinal: Negative for abdominal pain, constipation, diarrhea, nausea and vomiting.  Genitourinary: Negative for dysuria.  Musculoskeletal: Positive for joint pain.  Neurological: Negative for dizziness and headaches.   Exam: Physical Exam  Constitutional: He is oriented to person, place, and time.  HENT:  Nose: No mucosal edema.  Mouth/Throat: No oropharyngeal exudate or posterior oropharyngeal edema.  Eyes: Pupils are equal, round, and reactive to light. Conjunctivae, EOM and lids are normal.  Neck: No JVD present. Carotid bruit is not present. No edema present. No thyroid mass and no thyromegaly present.  Cardiovascular: S1 normal and S2 normal. Exam reveals no gallop.  No murmur heard. Pulses:      Dorsalis pedis pulses are 2+ on the right side and 2+ on the left side.  Respiratory: No respiratory distress. He has no wheezes. He has no rhonchi. He has no rales.  GI: Soft. Bowel sounds are normal. There is no abdominal  tenderness.  Musculoskeletal:     Right ankle: He exhibits no swelling.     Left ankle: He exhibits no swelling.  Lymphadenopathy:    He has no cervical adenopathy.  Neurological: He is alert and oriented to person, place, and time. No cranial nerve deficit.  Skin: Skin is warm. Nails show no clubbing.  Right second toe swelling and ulceration at the tip and discoloration.  Psychiatric: He has a normal mood and affect.      Data Reviewed: Basic Metabolic Panel: Recent Labs  Lab 04/01/19 2018 04/02/19 0523  NA 136 137  K 3.8 3.7  CL 97* 99  CO2 29 28  GLUCOSE 120* 160*  BUN 18 17  CREATININE 1.02 0.94  CALCIUM 9.3 9.1   Liver Function Tests: Recent Labs  Lab 04/01/19 2018  AST 15  ALT 14  ALKPHOS 95  BILITOT 0.9  PROT 7.9  ALBUMIN 4.0   CBC: Recent Labs  Lab 04/01/19 2018 04/02/19 0523  WBC 12.6* 9.7  NEUTROABS 8.8*  --   HGB 12.2* 11.5*  HCT 35.9* 33.9*  MCV 92.8 91.9  PLT 244 240    CBG: Recent Labs  Lab 04/01/19 2253 04/02/19 0554 04/02/19 1220  GLUCAP 129* 144* 127*    Recent Results (from the past 240 hour(s))  SARS Coronavirus 2 [facility] order, Performed in [facility] hospital lab) Nasopharyngeal Nasopharyngeal Swab     Status: None   Collection Time: 04/01/19  8:18 PM  Specimen: Nasopharyngeal Swab  Result Value Ref Range Status   SARS Coronavirus 2 NEGATIVE NEGATIVE Final    Comment: (NOTE) If result is NEGATIVE SARS-CoV-2 target nucleic acids are NOT DETECTED. The SARS-CoV-2 RNA is generally detectable in upper and lower  respiratory specimens during the acute phase of infection. The lowest  concentration of SARS-CoV-2 viral copies this assay can detect is 250  copies / mL. A negative result does not preclude SARS-CoV-2 infection  and should not be used as the sole basis for treatment or other  patient management decisions.  A negative result may occur with  improper specimen collection / handling, submission of specimen other   than nasopharyngeal swab, presence of viral mutation(s) within the  areas targeted by this assay, and inadequate number of viral copies  (<250 copies / mL). A negative result must be combined with clinical  observations, patient history, and epidemiological information. If result is POSITIVE SARS-CoV-2 target nucleic acids are DETECTED. The SARS-CoV-2 RNA is generally detectable in upper and lower  respiratory specimens dur ing the acute phase of infection.  Positive  results are indicative of active infection with SARS-CoV-2.  Clinical  correlation with patient history and other diagnostic information is  necessary to determine patient infection status.  Positive results do  not rule out bacterial infection or co-infection with other viruses. If result is PRESUMPTIVE POSTIVE SARS-CoV-2 nucleic acids MAY BE PRESENT.   A presumptive positive result was obtained on the submitted specimen  and confirmed on repeat testing.  While 2019 novel coronavirus  (SARS-CoV-2) nucleic acids may be present in the submitted sample  additional confirmatory testing may be necessary for epidemiological  and / or clinical management purposes  to differentiate between  SARS-CoV-2 and other Sarbecovirus currently known to infect humans.  If clinically indicated additional testing with an alternate test  methodology [phone]) is advised. The SARS-CoV-2 RNA is generally  detectable in upper and lower respiratory sp ecimens during the acute  phase of infection. The expected result is Negative. Fact Sheet for Patients:  StrictlyIdeas.no Fact Sheet for Healthcare Providers: BankingDealers.co.za This test is not yet approved or cleared by the [place] FDA and has been authorized for detection and/or diagnosis of SARS-CoV-2 by FDA under an Emergency Use Authorization (EUA).  This EUA will remain in effect (meaning this test can be used) for the duration of  the COVID-19 declaration under Section 564(b)(1) of the Act, 21 U.S.C. section 360bbb-3(b)(1), unless the authorization is terminated or revoked sooner. Performed at [facility], [address] [place], [place] [address]      Studies: Dg Foot Complete Right  Result Date: 04/01/2019 CLINICAL DATA:  Infection and swelling of the right second toe, denies injury EXAM: RIGHT FOOT COMPLETE - 3+ VIEW COMPARISON:  Radiograph 06/26/2015 FINDINGS: Postsurgical changes from prior fifth transmetatarsal amputation and first interphalangeal amputation. There is diffuse soft tissue swelling of the forefoot and more focal tissue swelling and ulceration at the tip of the second digit with subjacent osseous erosion and destructive changes involving the tuft of the second distal phalanx. No other concerning osseous erosions or cortical destruction is seen. Clawtoe deformities of the second through fourth digits are noted. Degenerative changes are noted in the midfoot and to a lesser extent hindfoot. Plantar calcaneal spur is noted. Vascular calcifications noted in the soft tissues. IMPRESSION: 1. Diffuse soft tissue swelling of the forefoot with more focal ulceration at the tip of the second digit. Osseous findings are consistent with osteomyelitis  of the tuft of the second distal phalanx. 2. Postsurgical changes from prior fifth transmetatarsal amputation and first interphalangeal amputation. Electronically Signed   By: Lovena Le M.D.   On: 04/01/2019 19:56    Scheduled Meds: . amLODipine  10 mg Oral Daily  . aspirin EC  81 mg Oral Daily  . chlorhexidine  60 mL Topical Once  . enoxaparin (LOVENOX) injection  40 mg Subcutaneous QHS  . feeding supplement  296 mL Oral Once  . hydrochlorothiazide  25 mg Oral Daily  . insulin aspart  0-9 Units Subcutaneous Q6H  . losartan  100 mg Oral Daily  . povidone-iodine  2 application Topical Once  . simvastatin  20 mg Oral q1800   Continuous Infusions: .  sodium chloride    . ceFEPime (MAXIPIME) IV 2 g (04/02/19 0935)  . vancomycin      Assessment/Plan:  1. Osteomyelitis right second toe.  Started Maxipime and vancomycin.  Podiatry to likely do an amputation this evening. 2. Type 2 diabetes mellitus on sliding scale.  Holding Basaglar insulin and metformin. 3. Hypertension on hydrochlorothiazide and amlodipine and losartan 4. Hyperlipidemia unspecified on simvastatin. 5. Obesity BMI 33.72 weight loss needed  Code Status:     Code Status Orders  (From admission, onward)         Start     Ordered   04/01/19 2240  Full code  Continuous     04/01/19 2239        Code Status History    Date Active Date Inactive Code Status Order ID Comments User Context   03/12/2018 0956 03/12/2018 1531 Full Code [id number]  Algernon Huxley, MD Inpatient   11/29/2017 0218 12/05/2017 1511 Full Code [id number]  Lance Coon, MD Inpatient   03/05/2017 1831 03/07/2017 1914 Full Code [id number]  Hillary Bow, MD ED   01/11/2016 1845 01/15/2016 1916 Full Code [id number]  Wylene Simmer, MD Inpatient   Advance Care Planning Activity      Disposition Plan: Will be determined based on procedure.  Consultants:  Podiatry  Antibiotics:  Vancomycin  Cefepime  Time spent: 28 minutes  [place]

## 2019-04-02 NOTE — Consult Note (Signed)
ORTHOPAEDIC CONSULTATION  REQUESTING PHYSICIAN: Loletha Grayer, MD  Chief Complaint: Osteomyelitis right second toe  HPI: [name] is a 58 y.o. male who complains of 10 days of pain and swelling to his right second toe.  Longstanding history of diabetes with neuropathy.  He is undergone previous partial great toe amputation and fifth ray amputation in the past.  He states he developed swelling and pain to his right second toe recently.  Seen in the ER and x-rays were taken which were concerning for osteomyelitis.  Past Medical History:  Diagnosis Date  . Arthritis   . Bacterial infection due to Morganella morganii 12/26/2017  . BPH (benign prostatic hypertrophy)   . Diabetes mellitus   . Diabetic neuropathy ([place])   . Hypertension   . Metatarsal bone fracture    left 5th toe  . Osteomyelitis of toe of right foot ([place])   . Peripheral vascular disease ([place])   . Post-operative infection    and diabetic ulcer left foot  . Sepsis due to Streptococcus, group B ([place]) 12/26/2017  . Sleep apnea     does wear CPAP  . Wears glasses    Past Surgical History:  Procedure Laterality Date  . AMPUTATION TOE Left    small toe  . COLONOSCOPY W/ BIOPSIES AND POLYPECTOMY    . I&D EXTREMITY Left 01/11/2016   Procedure: LEFT FOOT IRRIGATION AND DEBRIDEMENT WOUND VAC AND REMOVAL OF HARDWARE;  Surgeon: Wylene Simmer, MD;  Location: [place];  Service: Orthopedics;  Laterality: Left;  . LOWER EXTREMITY ANGIOGRAPHY N/A 12/01/2017   Procedure: Lower Extremity Angiography;  Surgeon: Algernon Huxley, MD;  Location: [place] [facility];  Service: Cardiovascular;  Laterality: N/A;  . LOWER EXTREMITY ANGIOGRAPHY Right 12/04/2017   Procedure: Lower Extremity Angiography;  Surgeon: Algernon Huxley, MD;  Location: [place] [facility];  Service: Cardiovascular;  Laterality: Right;  . LOWER EXTREMITY ANGIOGRAPHY Left 03/12/2018   Procedure: LOWER EXTREMITY ANGIOGRAPHY;  Surgeon: Algernon Huxley, MD;  Location: [place]  [facility];  Service: Cardiovascular;  Laterality: Left;  . ORIF TOE FRACTURE Left 06/08/2015   Procedure: OPEN REDUCTION INTERNAL FIXATION (ORIF) LEFT FIFTH METATARSAL BASE FRACTURE NONUNION; CALCANEAL AUTOGRAFT ;  Surgeon: Wylene Simmer, MD;  Location: [place];  Service: Orthopedics;  Laterality: Left;  . PATELLA RECONSTRUCTION Left 2005  . PROSTATE ABLATION  2014  . TOE AMPUTATION     partial amputation right great toe  . WOUND DEBRIDEMENT Bilateral 11/30/2017   Procedure: DEBRIDEMENT WOUND;  Surgeon: Sharlotte Alamo, DPM;  Location: [facility];  Service: Podiatry;  Laterality: Bilateral;   Social History   Socioeconomic History  . Marital status: Married    Spouse name: [name]  . Number of children: 2  . Years of education: Not on file  . Highest education level: Not on file  Occupational History  . Not on file  Social Needs  . Financial resource strain: Not very hard  . Food insecurity    Worry: Never true    Inability: Never true  . Transportation needs    Medical: No    Non-medical: No  Tobacco Use  . Smoking status: Never Smoker  . Smokeless tobacco: Never Used  Substance and Sexual Activity  . Alcohol use: Yes    Comment: occasional   . Drug use: Yes    Types: Marijuana, Cocaine    Comment: last month  . Sexual activity: Not on file  Lifestyle  . Physical activity  Days per week: 1 day    Minutes per session: 60 min  . Stress: Not at all  Relationships  . Social connections    Talks on phone: More than three times a week    Gets together: More than three times a week    Attends religious service: More than 4 times per year    Active member of club or organization: Yes    Attends meetings of clubs or organizations: More than 4 times per year    Relationship status: Married  Other Topics Concern  . Not on file  Social History Narrative   Lives with wife. Children all grown up.   Family History  Problem Relation Age of Onset  . Diabetes Mother   .  Diabetes Other    Allergies  Allergen Reactions  . Bactrim [Sulfamethoxazole-Trimethoprim] Hives   Prior to Admission medications   Medication Sig Start Date End Date Taking? Authorizing Provider  amLODipine (NORVASC) 10 MG tablet Take 10 mg by mouth daily. 03/27/19  Yes [provider]  aspirin EC 81 MG tablet Take 81 mg by mouth daily.   Yes [provider]  hydrochlorothiazide (HYDRODIURIL) 25 MG tablet Take 25 mg by mouth daily. 03/07/19  Yes [provider]  Insulin Glargine (BASAGLAR KWIKPEN) 100 UNIT/ML SOPN Inject 0.35 mLs (35 Units total) into the skin at bedtime. Patient taking differently: Inject 30 Units into the skin at bedtime.  05/21/17  Yes Renato Shin, MD  losartan (COZAAR) 100 MG tablet Take 100 mg by mouth daily.    Yes [provider]  metFORMIN (GLUCOPHAGE) 1000 MG tablet Take 1,000 mg by mouth 2 (two) times daily. 10/21/15  Yes [provider]  simvastatin (ZOCOR) 20 MG tablet Take 20 mg by mouth daily. 02/23/19  Yes [provider]  Calcium Carbonate-Vit D-Min (GNP CALCIUM 1200) 1200-1000 MG-UNIT CHEW Chew 1,200 mg by mouth daily with breakfast. Take in combination with vitamin D and magnesium. 03/16/18 09/12/18  Milinda Pointer, MD   Dg Foot Complete Right  Result Date: 04/01/2019 CLINICAL DATA:  Infection and swelling of the right second toe, denies injury EXAM: RIGHT FOOT COMPLETE - 3+ VIEW COMPARISON:  Radiograph 06/26/2015 FINDINGS: Postsurgical changes from prior fifth transmetatarsal amputation and first interphalangeal amputation. There is diffuse soft tissue swelling of the forefoot and more focal tissue swelling and ulceration at the tip of the second digit with subjacent osseous erosion and destructive changes involving the tuft of the second distal phalanx. No other concerning osseous erosions or cortical destruction is seen. Clawtoe deformities of the second through fourth digits are noted. Degenerative  changes are noted in the midfoot and to a lesser extent hindfoot. Plantar calcaneal spur is noted. Vascular calcifications noted in the soft tissues. IMPRESSION: 1. Diffuse soft tissue swelling of the forefoot with more focal ulceration at the tip of the second digit. Osseous findings are consistent with osteomyelitis of the tuft of the second distal phalanx. 2. Postsurgical changes from prior fifth transmetatarsal amputation and first interphalangeal amputation. Electronically Signed   By: Lovena Le M.D.   On: 04/01/2019 19:56    Positive ROS: All other systems have been reviewed and were otherwise negative with the exception of those mentioned in the HPI and as above.  12 point ROS was performed.  Physical Exam: General: Alert and oriented.  No apparent distress.  Vascular:  Left foot:Dorsalis Pedis:  diminished Posterior Tibial:  diminished  Right foot: Dorsalis Pedis:  diminished Posterior Tibial:  present  Neuro:absent protective sensation  Derm: There is an abscess on the very distal aspect of the right second toe.  I did drain this at bedside today and there was a full-thickness ulcer probe down to bone on the distal tuft.  Surrounding skin to the second toe to about the level of the proximal interphalangeal joint was removed as well.  This left Rall underlying epidermal tissue.  Ortho/MS: Noted edema to the second toe as well as diffusely to the right foot.  He status post right great toe and fifth ray amputation in the past.  Assessment: Osteomyelitis right second toe Diabetes with foot ulcer and abscess  Plan: I discussed with the patient there is obvious erosive changes on the distal tuft of the second toe with surrounding abscess.  A wound culture was performed of the drainage today.  I would like to go ahead and perform an amputation of the distal portion of the second toe to the level of good healthy skin though.  He understands this may require amputation of the entire toe  if needed.  The risk benefits alternatives of palpitations were discussed with the patient informed consent has been given.  He has been n.p.o. all day with the exception of drinking water.  He has had no other fluids today.  We will plan for surgery this evening unless is going to be delayed until very late and then will plan for surgery tomorrow morning.    Elesa Hacker, DPM Cell [phone]   04/02/2019 1:31 PM

## 2019-04-03 ENCOUNTER — Encounter: Payer: Self-pay | Admitting: Podiatry

## 2019-04-03 LAB — CBC
HCT: 34.2 % — ABNORMAL LOW (ref 39.0–52.0)
Hemoglobin: 11.9 g/dL — ABNORMAL LOW (ref 13.0–17.0)
MCH: 32 pg (ref 26.0–34.0)
MCHC: 34.8 g/dL (ref 30.0–36.0)
MCV: 91.9 fL (ref 80.0–100.0)
Platelets: 230 10*3/uL (ref 150–400)
RBC: 3.72 MIL/uL — ABNORMAL LOW (ref 4.22–5.81)
RDW: 12.7 % (ref 11.5–15.5)
WBC: 8.6 10*3/uL (ref 4.0–10.5)
nRBC: 0 % (ref 0.0–0.2)

## 2019-04-03 LAB — BASIC METABOLIC PANEL
Anion gap: 9 (ref 5–15)
BUN: 11 mg/dL (ref 6–20)
CO2: 28 mmol/L (ref 22–32)
Calcium: 8.7 mg/dL — ABNORMAL LOW (ref 8.9–10.3)
Chloride: 100 mmol/L (ref 98–111)
Creatinine, Ser: 0.83 mg/dL (ref 0.61–1.24)
GFR calc Af Amer: >60 mL/min (ref >60)
GFR calc non Af Amer: >60 mL/min (ref >60)
Glucose, Bld: 206 mg/dL — ABNORMAL HIGH (ref 70–99)
Potassium: 4.1 mmol/L (ref 3.5–5.1)
Sodium: 137 mmol/L (ref 135–145)

## 2019-04-03 LAB — GLUCOSE, CAPILLARY
Glucose-Capillary: 172 mg/dL — ABNORMAL HIGH (ref 70–99)
Glucose-Capillary: 194 mg/dL — ABNORMAL HIGH (ref 70–99)
Glucose-Capillary: 158 mg/dL — ABNORMAL HIGH (ref 70–99)

## 2019-04-03 NOTE — Progress Notes (Signed)
Daily Progress Note   Subjective  - 1 Day Post-Op  F.u 2nd toe amputation.  No pain  Objective Vitals:   04/02/19 2338 04/03/19 0057 04/03/19 0531 04/03/19 0741  BP: 112/65 132/71 135/68 (!) 120/104  Pulse: 92 91 90 100  Resp: 19 20 19 18   Temp: 98.3 F (36.8 C) 98.7 F (37.1 C) 98.2 F (36.8 C) 98.2 F (36.8 C)  TempSrc:  Oral    SpO2: 97% 95% 98% 93%  Weight:      Height:        Physical Exam: Wound is well coapted.  No purulence.  Good perfusion to skin flaps.  Minimal drainage to bandage  Laboratory CBC    Component Value Date/Time   WBC 8.6 04/03/2019 0948   HGB 11.9 (L) 04/03/2019 0948   HGB 13.0 03/26/2014 2050   HCT 34.2 (L) 04/03/2019 0948   HCT 39.7 (L) 03/26/2014 2050   PLT 230 04/03/2019 0948   PLT 202 03/26/2014 2050    BMET    Component Value Date/Time   NA 137 04/03/2019 0948   NA 139 02/10/2018 1154   NA 134 (L) 03/26/2014 2050   K 4.1 04/03/2019 0948   K 4.3 03/26/2014 2050   CL 100 04/03/2019 0948   CL 103 03/26/2014 2050   CO2 28 04/03/2019 0948   CO2 29 03/26/2014 2050   GLUCOSE 206 (H) 04/03/2019 0948   GLUCOSE 150 (H) 03/26/2014 2050   BUN 11 04/03/2019 0948   BUN 13 02/10/2018 1154   BUN 18 03/26/2014 2050   CREATININE 0.83 04/03/2019 0948   CREATININE 0.86 03/26/2014 2050   CALCIUM 8.7 (L) 04/03/2019 0948   CALCIUM 8.7 03/26/2014 2050   GFRNONAA >60 04/03/2019 0948   GFRNONAA >60 03/26/2014 2050   GFRNONAA >60 10/29/2012 1757   GFRAA >60 04/03/2019 0948   GFRAA >60 03/26/2014 2050   GFRAA >60 10/29/2012 1757    Assessment/Planning: Osteomyelitis 2nd toe   S/p amputation  Cultures still pending.  OK for WB to heel.  No dressing changes needed.  Can f/u with me next week (Thursday)  Suggest 24 hours of IV abx and d/c on po.  Recommend bactrim po.  Will follow cultures outpt.  OK to d/c tomorrow from podiatry standpoint.  [name] A  04/03/2019, 12:03 PM

## 2019-04-03 NOTE — Progress Notes (Signed)
Patient ID: [name], male   DOB: Apr 09, 1961, 58 y.o.   MRN: [id number]  [facility] PROGRESS NOTE  [name] DOB: 02/16/1961 DOA: 04/01/2019 PCP: Theotis Burrow, MD  HPI/Subjective: Patient is feeling better, status pos amputation of the second toe  Objective: Vitals:   04/03/19 0531 04/03/19 0741  BP: 135/68 (!) 120/104  Pulse: 90 100  Resp: 19 18  Temp: 98.2 F (36.8 C) 98.2 F (36.8 C)  SpO2: 98% 93%    Intake/Output Summary (Last 24 hours) at 04/03/2019 1446 Last data filed at 04/03/2019 0948 Gross per 24 hour  Intake 929.85 ml  Output 1303 ml  Net -373.15 ml   Filed Weights   04/01/19 1913  Weight: 106.6 kg    ROS: Review of Systems  Constitutional: Negative for chills and fever.  Eyes: Negative for blurred vision.  Respiratory: Negative for cough and shortness of breath.   Cardiovascular: Negative for chest pain.  Gastrointestinal: Negative for abdominal pain, constipation, diarrhea, nausea and vomiting.  Genitourinary: Negative for dysuria.  Musculoskeletal: Positive for joint pain.  Neurological: Negative for dizziness and headaches.   Exam: Physical Exam  Constitutional: He is oriented to person, place, and time.  HENT:  Nose: No mucosal edema.  Mouth/Throat: No oropharyngeal exudate or posterior oropharyngeal edema.  Eyes: Pupils are equal, round, and reactive to light. Conjunctivae, EOM and lids are normal.  Neck: No JVD present. Carotid bruit is not present. No edema present. No thyroid mass and no thyromegaly present.  Cardiovascular: S1 normal and S2 normal. Exam reveals no gallop.  No murmur heard. Pulses:      Dorsalis pedis pulses are 2+ on the right side and 2+ on the left side.  Respiratory: No respiratory distress. He has no wheezes. He has no rhonchi. He has no rales.  GI: Soft. Bowel sounds are normal. There is no abdominal tenderness.  Musculoskeletal:     Right ankle: He exhibits no swelling.   Left ankle: He exhibits no swelling.  Lymphadenopathy:    He has no cervical adenopathy.  Neurological: He is alert and oriented to person, place, and time. No cranial nerve deficit.  Skin: Skin is warm. Nails show no clubbing.  Right foot status post amputation of the second toe wound clean dressing  Psychiatric: He has a normal mood and affect.      Data Reviewed: Basic Metabolic Panel: Recent Labs  Lab 04/01/19 2018 04/02/19 0523 04/03/19 0948  NA 136 137 137  K 3.8 3.7 4.1  CL 97* 99 100  CO2 29 28 28   GLUCOSE 120* 160* 206*  BUN 18 17 11   CREATININE 1.02 0.94 0.83  CALCIUM 9.3 9.1 8.7*   Liver Function Tests: Recent Labs  Lab 04/01/19 2018  AST 15  ALT 14  ALKPHOS 95  BILITOT 0.9  PROT 7.9  ALBUMIN 4.0   CBC: Recent Labs  Lab 04/01/19 2018 04/02/19 0523 04/03/19 0948  WBC 12.6* 9.7 8.6  NEUTROABS 8.8*  --   --   HGB 12.2* 11.5* 11.9*  HCT 35.9* 33.9* 34.2*  MCV 92.8 91.9 91.9  PLT 244 240 230    CBG: Recent Labs  Lab 04/02/19 1651 04/02/19 2121 04/02/19 2337 04/03/19 0529 04/03/19 1217  GLUCAP 105* 165* 214* 172* 194*    Recent Results (from the past 240 hour(s))  SARS Coronavirus 2 [facility] order, Performed in [facility] hospital lab) Nasopharyngeal Nasopharyngeal Swab     Status: None   Collection Time: 04/01/19  8:18 PM   Specimen: Nasopharyngeal Swab  Result Value Ref Range Status   SARS Coronavirus 2 NEGATIVE NEGATIVE Final    Comment: (NOTE) If result is NEGATIVE SARS-CoV-2 target nucleic acids are NOT DETECTED. The SARS-CoV-2 RNA is generally detectable in upper and lower  respiratory specimens during the acute phase of infection. The lowest  concentration of SARS-CoV-2 viral copies this assay can detect is 250  copies / mL. A negative result does not preclude SARS-CoV-2 infection  and should not be used as the sole basis for treatment or other  patient management decisions.  A negative result may occur with  improper  specimen collection / handling, submission of specimen other  than nasopharyngeal swab, presence of viral mutation(s) within the  areas targeted by this assay, and inadequate number of viral copies  (<250 copies / mL). A negative result must be combined with clinical  observations, patient history, and epidemiological information. If result is POSITIVE SARS-CoV-2 target nucleic acids are DETECTED. The SARS-CoV-2 RNA is generally detectable in upper and lower  respiratory specimens dur ing the acute phase of infection.  Positive  results are indicative of active infection with SARS-CoV-2.  Clinical  correlation with patient history and other diagnostic information is  necessary to determine patient infection status.  Positive results do  not rule out bacterial infection or co-infection with other viruses. If result is PRESUMPTIVE POSTIVE SARS-CoV-2 nucleic acids MAY BE PRESENT.   A presumptive positive result was obtained on the submitted specimen  and confirmed on repeat testing.  While 2019 novel coronavirus  (SARS-CoV-2) nucleic acids may be present in the submitted sample  additional confirmatory testing may be necessary for epidemiological  and / or clinical management purposes  to differentiate between  SARS-CoV-2 and other Sarbecovirus currently known to infect humans.  If clinically indicated additional testing with an alternate test  methodology [phone]) is advised. The SARS-CoV-2 RNA is generally  detectable in upper and lower respiratory sp ecimens during the acute  phase of infection. The expected result is Negative. Fact Sheet for Patients:  StrictlyIdeas.no Fact Sheet for Healthcare Providers: BankingDealers.co.za This test is not yet approved or cleared by the [place] FDA and has been authorized for detection and/or diagnosis of SARS-CoV-2 by FDA under an Emergency Use Authorization (EUA).  This EUA will remain in  effect (meaning this test can be used) for the duration of the COVID-19 declaration under Section 564(b)(1) of the Act, 21 U.S.C. section 360bbb-3(b)(1), unless the authorization is terminated or revoked sooner. Performed at [facility], [address] [place], [place] [address]   Aerobic/Anaerobic Culture (surgical/deep wound)     Status: None (Preliminary result)   Collection Time: 04/02/19  3:05 PM   Specimen: Wound; Abscess  Result Value Ref Range Status   Specimen Description   Final    WOUND Performed at [facility], [address] [place], [place] [address]    Special Requests   Final    RIGHTTOE Performed at [facility], [place], [place] [address]    Gram Stain NO WBC SEEN NO ORGANISMS SEEN   Final   Culture   Final    NO GROWTH < 24 HOURS Performed at [facility], [place] [address], [place], [place] [address]    Report Status PENDING  Incomplete     Studies: Dg Foot Complete Right  Result Date: 04/01/2019 CLINICAL DATA:  Infection and swelling of the right second toe, denies injury  EXAM: RIGHT FOOT COMPLETE - 3+ VIEW COMPARISON:  Radiograph 06/26/2015 FINDINGS: Postsurgical changes from prior fifth transmetatarsal amputation and first interphalangeal amputation. There is diffuse soft tissue swelling of the forefoot and more focal tissue swelling and ulceration at the tip of the second digit with subjacent osseous erosion and destructive changes involving the tuft of the second distal phalanx. No other concerning osseous erosions or cortical destruction is seen. Clawtoe deformities of the second through fourth digits are noted. Degenerative changes are noted in the midfoot and to a lesser extent hindfoot. Plantar calcaneal spur is noted. Vascular calcifications noted in the soft tissues. IMPRESSION: 1. Diffuse soft tissue swelling of the forefoot with more focal ulceration at the tip of the second digit. Osseous findings  are consistent with osteomyelitis of the tuft of the second distal phalanx. 2. Postsurgical changes from prior fifth transmetatarsal amputation and first interphalangeal amputation. Electronically Signed   By: Lovena Le M.D.   On: 04/01/2019 19:56    Scheduled Meds: . amLODipine  10 mg Oral Daily  . aspirin EC  81 mg Oral Daily  . enoxaparin (LOVENOX) injection  40 mg Subcutaneous QHS  . hydrochlorothiazide  25 mg Oral Daily  . insulin aspart  0-9 Units Subcutaneous Q6H  . losartan  100 mg Oral Daily  . simvastatin  20 mg Oral q1800   Continuous Infusions: . sodium chloride 10 mL/hr at 04/03/19 0732  . ceFEPime (MAXIPIME) IV 2 g (04/03/19 1413)  . vancomycin 1,000 mg (04/03/19 [id number])    Assessment/Plan:  1. Osteomyelitis right second toe.  Status post amputation 04/02/19 cultures are still pending, okay to weight-bear to heal.  Podiatry is recommending to continue IV antibiotics Maxipime and vancomycin for 24 more hours and to discharge tomorrow with Bactrim p.o. if patient is clinically stable.  Podiatry will follow cultures outpatient.  No dressing changes needed  2. Type 2 diabetes mellitus on sliding scale.  Holding Basaglar insulin and metformin. 3. Hypertension on hydrochlorothiazide and amlodipine and losartan 4. Hyperlipidemia unspecified on simvastatin. 5. Obesity BMI 33.72 weight loss needed.  Lifestyle modifications recommended  Code Status:     Code Status Orders  (From admission, onward)         Start     Ordered   04/01/19 2240  Full code  Continuous     04/01/19 2239        Code Status History    Date Active Date Inactive Code Status Order ID Comments User Context   03/12/2018 0956 03/12/2018 1531 Full Code [id number]  Algernon Huxley, MD Inpatient   11/29/2017 0218 12/05/2017 1511 Full Code [id number]  Lance Coon, MD Inpatient   03/05/2017 1831 03/07/2017 1914 Full Code [id number]  Hillary Bow, MD ED   01/11/2016 1845 01/15/2016 1916 Full Code [id number]  Wylene Simmer, MD Inpatient   Advance Care Planning Activity      Disposition Plan: Anticipate discharge tomorrow  Consultants:  Podiatry  Antibiotics:  Vancomycin  Cefepime  Time spent: 33 minutes  Illene Silver Brytani Voth  [employer]

## 2019-04-03 NOTE — Consult Note (Signed)
Pharmacy Antibiotic Note- Day 2  [name] is a 58 y.o. male admitted on 04/01/2019 with osteomyelitis and diabetic foot abcess.  Pharmacy has been consulted for Vancomycin/Cefepime dosing. Patient s/p amputation of right second tow with wound cultures pending.   Plan: Continue Cefepime 2g q8h  Continue Vancomycin 1000 mg IV Q 12 hrs.  Goal AUC 400-550. Expected AUC: 476 SCr used: 0.94   Height: 5\' 10"  (177.8 cm) Weight: 235 lb (106.6 kg) IBW/kg (Calculated) : 73  Temp (24hrs), Avg:98.3 F (36.8 C), Min:97.8 F (36.6 C), Max:98.7 F (37.1 C)  Recent Labs  Lab 04/01/19 2018 04/02/19 0523 04/03/19 0948  WBC 12.6* 9.7 8.6  CREATININE 1.02 0.94 0.83    Estimated Creatinine Clearance: 120 mL/min (by C-G formula based on SCr of 0.83 mg/dL).    Allergies  Allergen Reactions  . Bactrim [Sulfamethoxazole-Trimethoprim] Hives    Antimicrobials this admission: 10/01 Zosyn x 1 10/01 Vancomycin >> 10/02 Cefepime >>   Dose adjustments this admission: N/A  Microbiology results: 10/2 Wound Culture: pending  10/1 COVID: negative   Thank you for allowing pharmacy to be a part of this patient's care.  Simpson,Michael L, 04/03/2019 11:07 AM

## 2019-04-04 LAB — GLUCOSE, CAPILLARY
Glucose-Capillary: 169 mg/dL — ABNORMAL HIGH (ref 70–99)
Glucose-Capillary: 199 mg/dL — ABNORMAL HIGH (ref 70–99)

## 2019-04-04 LAB — CREATININE, SERUM
Creatinine, Ser: 0.9 mg/dL (ref 0.61–1.24)
GFR calc Af Amer: >60 mL/min (ref >60)
GFR calc non Af Amer: >60 mL/min (ref >60)

## 2019-04-04 MED ORDER — DOXYCYCLINE HYCLATE 100 MG PO TABS
100.0000 mg | ORAL_TABLET | Freq: Two times a day (BID) | ORAL | Status: DC
  Administered 2019-04-04: 100 mg via ORAL
  Filled 2019-04-04: qty 1

## 2019-04-04 MED ORDER — AMOXICILLIN-POT CLAVULANATE 875-125 MG PO TABS
1.0000 | ORAL_TABLET | Freq: Two times a day (BID) | ORAL | Status: DC
  Administered 2019-04-04: 1 via ORAL
  Filled 2019-04-04: qty 1

## 2019-04-04 MED ORDER — AMOXICILLIN-POT CLAVULANATE 875-125 MG PO TABS: 1.0000 | ORAL_TABLET | Freq: Two times a day (BID) | ORAL | 0 refills | Status: DC

## 2019-04-04 MED ORDER — ACETAMINOPHEN 325 MG PO TABS: 650.0000 mg | ORAL_TABLET | Freq: Four times a day (QID) | ORAL | Status: AC | PRN

## 2019-04-04 MED ORDER — DOXYCYCLINE HYCLATE 100 MG PO TABS: 100.0000 mg | ORAL_TABLET | Freq: Two times a day (BID) | ORAL | 0 refills | Status: DC

## 2019-04-04 MED ORDER — BACID PO TABS: 2.0000 | ORAL_TABLET | Freq: Three times a day (TID) | ORAL | 0 refills | Status: DC

## 2019-04-04 MED ORDER — RISAQUAD PO CAPS
2.0000 | ORAL_CAPSULE | Freq: Three times a day (TID) | ORAL | Status: DC
  Filled 2019-04-04 (×3): qty 2

## 2019-04-04 MED ORDER — OXYCODONE HCL 5 MG PO TABS: 5.0000 mg | ORAL_TABLET | ORAL | 0 refills | Status: DC | PRN

## 2019-04-04 MED ORDER — GNP CALCIUM 1200 1200-1000 MG-UNIT PO CHEW: 1200.0000 mg | CHEWABLE_TABLET | Freq: Every day | ORAL | 5 refills | Status: DC

## 2019-04-04 NOTE — Progress Notes (Signed)
PT Cancellation Note  Patient Details Name: [name] MRN: [id number] DOB: 1961-02-19   Cancelled Treatment:    Reason Eval/Treat Not Completed: Other (comment)(Patient does not have post op shoe that was ordered yesterday per nursing. Cannot WB R foot without it. Spoke to nursing who contacted PACU again to get it. Will attempt again later today when shoe has arrived.)  Clarise Cruz R. Graylon Good, PT, DPT 04/04/19, 9:01 AM

## 2019-04-04 NOTE — Progress Notes (Signed)
Patient discharging home. Instructions and prescriptions given to patient, verbalized understanding. Wife to come transport patient home.

## 2019-04-04 NOTE — Discharge Summary (Signed)
[facility] NAME: [name]    MR#:  [id number]  DATE OF BIRTH:  01/18/1961  DATE OF ADMISSION:  04/01/2019 ADMITTING PHYSICIAN: Lance Coon, MD  DATE OF DISCHARGE:  04/04/19   PRIMARY CARE PHYSICIAN: Theotis Burrow, MD    ADMISSION DIAGNOSIS:  Osteomyelitis of second toe of right foot ([place]) [M86.9]  DISCHARGE DIAGNOSIS:  Principal Problem:   Osteomyelitis ([place]) Active Problems:   HTN (hypertension)   HLD (hyperlipidemia)   Sleep apnea   Type 2 diabetes mellitus with diabetic polyneuropathy, with long-term current use of insulin ([place])   SECONDARY DIAGNOSIS:   Past Medical History:  Diagnosis Date  . Arthritis   . Bacterial infection due to Morganella morganii 12/26/2017  . BPH (benign prostatic hypertrophy)   . Diabetes mellitus   . Diabetic neuropathy ([place])   . Hypertension   . Metatarsal bone fracture    left 5th toe  . Osteomyelitis of toe of right foot ([place])   . Peripheral vascular disease ([place])   . Post-operative infection    and diabetic ulcer left foot  . Sepsis due to Streptococcus, group B ([place]) 12/26/2017  . Sleep apnea     does wear CPAP  . Wears glasses     HOSPITAL COURSE:   HPI  [name]  is a 58 y.o. male who presents with chief complaint as above.  Patient presents the ED with a complaint of toe pain on his right foot.  He has an area of injury on his toe, and imaging tonight shows concern for osteomyelitis.  IV antibiotics started and hospitalist called for admission  1. Osteomyelitis right second toe.  Status post amputation 04/02/19 cultures with no growth so far, okay to weight-bear to heal.  Podiatry is recommending to discharge patient with p.o. clindamycin and outpatient follow-up with podiatry on Thursday.  Patient is to call and make an appointment.  No dressing changes needed until patient follows up with Dr. Vickki Muff. Podiatry will follow cultures outpatient.    2. Type 2 diabetes mellitus on sliding scale.  Patient CBGs are running around 200 Holding Basaglar insulin and resuming metformin. 3. Hypertension on hydrochlorothiazide and amlodipine and losartan 4. Hyperlipidemia unspecified on simvastatin. 5. Obesity BMI 33.72 weight loss needed.  Lifestyle modifications recommended  DISCHARGE CONDITIONS:   fair  CONSULTS OBTAINED:     PROCEDURES right second toe amputation  DRUG ALLERGIES:   Allergies  Allergen Reactions  . Bactrim [Sulfamethoxazole-Trimethoprim] Hives    DISCHARGE MEDICATIONS:   Allergies as of 04/04/2019      Reactions   Bactrim [sulfamethoxazole-trimethoprim] Hives      Medication List    STOP taking these medications   Basaglar KwikPen 100 UNIT/ML Sopn     TAKE these medications   acetaminophen 325 MG tablet Commonly known as: TYLENOL Take 2 tablets (650 mg total) by mouth every 6 (six) hours as needed for mild pain (or Fever >/= 101).   amLODipine 10 MG tablet Commonly known as: NORVASC Take 10 mg by mouth daily.   aspirin EC 81 MG tablet Take 81 mg by mouth daily.   doxycycline 100 MG tablet Commonly known as: VIBRA-TABS Take 1 tablet (100 mg total) by mouth every 12 (twelve) hours.   GNP Calcium 1200 1200-1000 MG-UNIT Chew Chew 1,200 mg by mouth daily with breakfast. Take in combination with vitamin D and magnesium.   hydrochlorothiazide 25 MG tablet Commonly known as: HYDRODIURIL Take 25 mg  by mouth daily.   lactobacillus acidophilus Tabs tablet Take 2 tablets by mouth 3 (three) times daily.   losartan 100 MG tablet Commonly known as: COZAAR Take 100 mg by mouth daily.   metFORMIN 1000 MG tablet Commonly known as: GLUCOPHAGE Take 1,000 mg by mouth 2 (two) times daily.   oxyCODONE 5 MG immediate release tablet Commonly known as: Oxy IR/ROXICODONE Take 1 tablet (5 mg total) by mouth every 4 (four) hours as needed for moderate pain.   simvastatin 20 MG tablet Commonly known as:  ZOCOR Take 20 mg by mouth daily.        DISCHARGE INSTRUCTIONS:  Follow-up with the primary care physician in 3 days Follow-up with podiatry on Thursday, do not to drive until cleared by podiatry   DIET:  Cardiac diet and Diabetic diet  DISCHARGE CONDITION:  Fair  ACTIVITY:  Activity as tolerated per PT with postop shoe  OXYGEN:  Home Oxygen: No.   Oxygen Delivery: room air  DISCHARGE LOCATION:  home   If you experience worsening of your admission symptoms, develop shortness of breath, life threatening emergency, suicidal or homicidal thoughts you must seek medical attention immediately by calling 911 or calling your MD immediately  if symptoms less severe.  You Must read complete instructions/literature along with all the possible adverse reactions/side effects for all the Medicines you take and that have been prescribed to you. Take any new Medicines after you have completely understood and accpet all the possible adverse reactions/side effects.   Please note  You were cared for by a hospitalist during your hospital stay. If you have any questions about your discharge medications or the care you received while you were in the hospital after you are discharged, you can call the unit and asked to speak with the hospitalist on call if the hospitalist that took care of you is not available. Once you are discharged, your primary care physician will handle any further medical issues. Please note that NO REFILLS for any discharge medications will be authorized once you are discharged, as it is imperative that you return to your primary care physician (or establish a relationship with a primary care physician if you do not have one) for your aftercare needs so that they can reassess your need for medications and monitor your lab values.     Today  Chief Complaint  Patient presents with  . Toe Pain   Patient is feeling fine denies any complaints and wants to go home.  Okay to  discharge patient from podiatry standpoint.  Pain well controlled  ROS:  CONSTITUTIONAL: Denies fevers, chills. Denies any fatigue, weakness.  EYES: Denies blurry vision, double vision, eye pain. EARS, NOSE, THROAT: Denies tinnitus, ear pain, hearing loss. RESPIRATORY: Denies cough, wheeze, shortness of breath.  CARDIOVASCULAR: Denies chest pain, palpitations, edema.  GASTROINTESTINAL: Denies nausea, vomiting, diarrhea, abdominal pain. Denies bright red blood per rectum. GENITOURINARY: Denies dysuria, hematuria. ENDOCRINE: Denies nocturia or thyroid problems. HEMATOLOGIC AND LYMPHATIC: Denies easy bruising or bleeding. SKIN: Denies rash or lesion. MUSCULOSKELETAL: RIGHT FOOT STATUS POST SURGERY  NEUROLOGIC: Denies paralysis, paresthesias.  PSYCHIATRIC: Denies anxiety or depressive symptoms.   VITAL SIGNS:  Blood pressure 130/87, pulse 89, temperature 98.3 F (36.8 C), resp. rate 18, height 5\' 10"  (1.778 m), weight 106.6 kg, SpO2 99 %.  I/O:    Intake/Output Summary (Last 24 hours) at 04/04/2019 1023 Last data filed at 04/04/2019 0937 Gross per 24 hour  Intake 240 ml  Output 800 ml  Net -560 ml    PHYSICAL EXAMINATION:  GENERAL:  58 y.o.-year-old patient lying in the bed with no acute distress.  EYES: Pupils equal, round, reactive to light and accommodation. No scleral icterus. Extraocular muscles intact.  HEENT: Head atraumatic, normocephalic. Oropharynx and nasopharynx clear.  NECK:  Supple, no jugular venous distention. No thyroid enlargement, no tenderness.  LUNGS: Normal breath sounds bilaterally, no wheezing, rales,rhonchi or crepitation. No use of accessory muscles of respiration.  CARDIOVASCULAR: S1, S2 normal. No murmurs, rubs, or gallops.  ABDOMEN: Soft, non-tender, non-distended. Bowel sounds present. No organomegaly or mass.  EXTREMITIES: RIGHT FOOT AND CLEAN DRESSING STATUS POST SECOND TOE AMPUTATION  NEUROLOGIC: Cranial nerves II through XII are intact. Muscle  strength 5/5 in all extremities except right foot. Sensation intact. Gait not checked.  PSYCHIATRIC: The patient is alert and oriented x 3.  SKIN: No obvious rash, lesion, or ulcer.   DATA REVIEW:   CBC Recent Labs  Lab 04/03/19 0948  WBC 8.6  HGB 11.9*  HCT 34.2*  PLT 230    Chemistries  Recent Labs  Lab 04/01/19 2018  04/03/19 0948 04/04/19 0554  NA 136   < > 137  --   K 3.8   < > 4.1  --   CL 97*   < > 100  --   CO2 29   < > 28  --   GLUCOSE 120*   < > 206*  --   BUN 18   < > 11  --   CREATININE 1.02   < > 0.83 0.90  CALCIUM 9.3   < > 8.7*  --   AST 15  --   --   --   ALT 14  --   --   --   ALKPHOS 95  --   --   --   BILITOT 0.9  --   --   --    < > = values in this interval not displayed.    Cardiac Enzymes No results for input(s): TROPONINI in the last 168 hours.  Microbiology Results  Results for orders placed or performed during the hospital encounter of 04/01/19  SARS Coronavirus 2 [facility] order, Performed in [facility] hospital lab) Nasopharyngeal Nasopharyngeal Swab     Status: None   Collection Time: 04/01/19  8:18 PM   Specimen: Nasopharyngeal Swab  Result Value Ref Range Status   SARS Coronavirus 2 NEGATIVE NEGATIVE Final    Comment: (NOTE) If result is NEGATIVE SARS-CoV-2 target nucleic acids are NOT DETECTED. The SARS-CoV-2 RNA is generally detectable in upper and lower  respiratory specimens during the acute phase of infection. The lowest  concentration of SARS-CoV-2 viral copies this assay can detect is 250  copies / mL. A negative result does not preclude SARS-CoV-2 infection  and should not be used as the sole basis for treatment or other  patient management decisions.  A negative result may occur with  improper specimen collection / handling, submission of specimen other  than nasopharyngeal swab, presence of viral mutation(s) within the  areas targeted by this assay, and inadequate number of viral copies  (<250 copies / mL). A  negative result must be combined with clinical  observations, patient history, and epidemiological information. If result is POSITIVE SARS-CoV-2 target nucleic acids are DETECTED. The SARS-CoV-2 RNA is generally detectable in upper and lower  respiratory specimens dur ing the acute phase of infection.  Positive  results are indicative of active infection with SARS-CoV-2.  Clinical  correlation with patient history and other diagnostic information is  necessary to determine patient infection status.  Positive results do  not rule out bacterial infection or co-infection with other viruses. If result is PRESUMPTIVE POSTIVE SARS-CoV-2 nucleic acids MAY BE PRESENT.   A presumptive positive result was obtained on the submitted specimen  and confirmed on repeat testing.  While 2019 novel coronavirus  (SARS-CoV-2) nucleic acids may be present in the submitted sample  additional confirmatory testing may be necessary for epidemiological  and / or clinical management purposes  to differentiate between  SARS-CoV-2 and other Sarbecovirus currently known to infect humans.  If clinically indicated additional testing with an alternate test  methodology [id number]) is advised. The SARS-CoV-2 RNA is generally  detectable in upper and lower respiratory sp ecimens during the acute  phase of infection. The expected result is Negative. Fact Sheet for Patients:  StrictlyIdeas.no Fact Sheet for Healthcare Providers: BankingDealers.co.za This test is not yet approved or cleared by the [place] FDA and has been authorized for detection and/or diagnosis of SARS-CoV-2 by FDA under an Emergency Use Authorization (EUA).  This EUA will remain in effect (meaning this test can be used) for the duration of the COVID-19 declaration under Section 564(b)(1) of the Act, 21 U.S.C. section 360bbb-3(b)(1), unless the authorization is terminated or revoked sooner. Performed  at [facility], [address] [place], [place] [address]   Aerobic/Anaerobic Culture (surgical/deep wound)     Status: None (Preliminary result)   Collection Time: 04/02/19  3:05 PM   Specimen: Wound; Abscess  Result Value Ref Range Status   Specimen Description   Final    WOUND Performed at [facility], [address] [place], [place] [address]    Special Requests   Final    RIGHTTOE Performed at [facility], [place], [place] [address]    Gram Stain NO WBC SEEN NO ORGANISMS SEEN   Final   Culture   Final    NO GROWTH < 24 HOURS Performed at [facility], [place] [address], [place],  [address]    Report Status PENDING  Incomplete    RADIOLOGY:  Dg Foot Complete Right  Result Date: 04/01/2019 CLINICAL DATA:  Infection and swelling of the right second toe, denies injury EXAM: RIGHT FOOT COMPLETE - 3+ VIEW COMPARISON:  Radiograph 06/26/2015 FINDINGS: Postsurgical changes from prior fifth transmetatarsal amputation and first interphalangeal amputation. There is diffuse soft tissue swelling of the forefoot and more focal tissue swelling and ulceration at the tip of the second digit with subjacent osseous erosion and destructive changes involving the tuft of the second distal phalanx. No other concerning osseous erosions or cortical destruction is seen. Clawtoe deformities of the second through fourth digits are noted. Degenerative changes are noted in the midfoot and to a lesser extent hindfoot. Plantar calcaneal spur is noted. Vascular calcifications noted in the soft tissues. IMPRESSION: 1. Diffuse soft tissue swelling of the forefoot with more focal ulceration at the tip of the second digit. Osseous findings are consistent with osteomyelitis of the tuft of the second distal phalanx. 2. Postsurgical changes from prior fifth transmetatarsal amputation and first interphalangeal amputation. Electronically Signed   By: Lovena Le  M.D.   On: 04/01/2019 19:56    EKG:   Orders placed or performed during the hospital encounter of 04/30/18  . ED EKG  . ED EKG  . EKG      Management plans discussed  with the patient, family and they are in agreement.  CODE STATUS:     Code Status Orders  (From admission, onward)         Start     Ordered   04/01/19 2240  Full code  Continuous     04/01/19 2239        Code Status History    Date Active Date Inactive Code Status Order ID Comments User Context   03/12/2018 0956 03/12/2018 1531 Full Code [id number]  Algernon Huxley, MD Inpatient   11/29/2017 0218 12/05/2017 1511 Full Code [id number]  Lance Coon, MD Inpatient   03/05/2017 1831 03/07/2017 1914 Full Code [id number]  Hillary Bow, MD ED   01/11/2016 1845 01/15/2016 1916 Full Code [id number]  Wylene Simmer, MD Inpatient   Advance Care Planning Activity      TOTAL TIME TAKING CARE OF THIS PATIENT:  45  minutes.   Note: This dictation was prepared with Dragon dictation along with smaller phrase technology. Any transcriptional errors that result from this process are unintentional.   @M[facility] @  on 04/04/2019 at 10:23 AM  Between 7am to 6pm - Pager - [phone]  After 6pm go to [email or web address] - password [email or web address] [facility]  Office  [phone]  CC: Primary care physician; Theotis Burrow, MD

## 2019-04-04 NOTE — Evaluation (Signed)
Physical Therapy Evaluation Patient Details Name: [name] MRN: [id number] DOB: 07-13-1960 Today's Date: 04/04/2019   History of Present Illness  [name] is a 58 y.o. male who presented to the hospital ED on 04/01/2019 with complaint of R toe pain. He was admitted to the hospital with osteomyuelitis and diabetic foot abcess. He underwent amputation of the R 2nd toe on 04/02/2019. He is currently to weight bear to heel and has a post op shoe. Relevant  PMH includes DMII, HTN, hyperlipidemia, obesity, diabetic neuropathy, sleep apnea, previous infections and amputated toes.    Clinical Impression  Patient alert and oriented and eager to participate in physical therapy so he can be cleared for discharge home. Was able to provide a detailed history including that he was independent with all mobility and function prior to hospitalization including working full time as a Geophysicist/field seismologist. Patient was independent for bed mobility, required supervision for transfers, and CGA for ambulation. He demonstrated functional gait with no LOB without assistive device, but was limited in R tibial translation over foot causing altered gait pattern. The post-op boot provided was too big, but patient was able to use it to ambulate maintaining weight bearing precautions. However, he would be safer and more successful with the correct sized boot, so nursing was made aware. Patient able to ascend and descend 4 steps using 2 handrails and step to gait pattern. Patient appears to be functionally independent at this point and recommend initial PT eval only and discharge home with no PT services.    Follow Up Recommendations No PT follow up    Equipment Recommendations  Other (comment)(correct sized post-op shoe)    Recommendations for Other Services       Precautions / Restrictions Precautions Precautions: Other (comment) Precaution Comments: R LE weight bear to heel with post-op shoe. Required Braces or Orthoses:  Other Brace Other Brace: R post op shoe Restrictions Weight Bearing Restrictions: Yes RLE Weight Bearing: (R LE weight bear to heel with post-op shoe) Other Position/Activity Restrictions: R LE weight bear to heel with post-op shoe      Mobility  Bed Mobility Overal bed mobility: Independent             General bed mobility comments: supine to sit I  Transfers Overall transfer level: Needs assistance Equipment used: None Transfers: Sit to/from Stand Sit to Stand: Supervision         General transfer comment: Patient required supervision and education on weight bearing during transfer.  Ambulation/Gait Ambulation/Gait assistance: Min guard Gait Distance (Feet): 300 Feet Assistive device: None Gait Pattern/deviations: WFL(Within Functional Limits);Step-to pattern;Decreased step length - left;Decreased stance time - right;Decreased weight shift to right;Wide base of support Gait velocity: reduced but functional   General Gait Details: Patient ambulates with step to pattern leading with R LE. Right tibial translation limited due to WB status and shoe.  Stairs Stairs: Yes Stairs assistance: Supervision Stair Management: Two rails;Step to pattern;Sideways Number of Stairs: 4 General stair comments: Patient ascended and descended 4 steps with BUE support on handrails and cuing for foot placement.  Wheelchair Mobility    Modified Rankin (Stroke Patients Only)       Balance Overall balance assessment: Needs assistance Sitting-balance support: Feet unsupported Sitting balance-Leahy Scale: Normal     Standing balance support: No upper extremity supported;During functional activity Standing balance-Leahy Scale: Good Standing balance comment: Ambulates with CGA for safety.  Pertinent Vitals/Pain Pain Assessment: 0-10 Pain Score: 5 ("nothing for me") Pain Location: R foot Pain Intervention(s): Monitored during session     [name] expects to be discharged to:: Private residence Living Arrangements: Spouse/significant other Available Help at Discharge: Family(wife is a Marine scientist who works full time in [place]) Type of Home: House Home Access: Stairs to enter Entrance Stairs-Rails: Can reach both;Right;Left Entrance Stairs-Number of Steps: 5-6 Home Layout: One [place]: Environmental consultant - 2 wheels;Cane - single point      Prior Function Level of Independence: Independent         Comments: 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 Was fully I with all ADLs, IADLs, and mobility prior to hospitalization.     Hand Dominance        Extremity/Trunk Assessment   Upper Extremity Assessment Upper Extremity Assessment: Overall WFL for tasks assessed    Lower Extremity Assessment Lower Extremity Assessment: Overall WFL for tasks assessed;RLE deficits/detail RLE Deficits / Details: limited dorsiflexion and weight bearing related to surgery. Strength within functional limits    Cervical / Trunk Assessment Cervical / Trunk Assessment: Normal  Communication   Communication: No difficulties  Cognition Arousal/Alertness: Awake/alert Behavior During Therapy: WFL for tasks assessed/performed Overall Cognitive Status: Within Functional Limits for tasks assessed                                        General Comments      Exercises Other Exercises Other Exercises: Patient reports he has been through similar recovery previously and has no questions or concerns about his home. Notes his op shoe seems too big. Educated patient that the shoe did seem too large for ideal size and demonstrated to nursing to let them know to be able to obtain proper size. Educated patient on WB precautions and use of shoe.   Assessment/Plan    PT Assessment Patent does not need any further PT services  PT Problem List         PT Treatment Interventions      PT Goals (Current  goals can be found in the Care Plan section)  Acute Rehab PT Goals Patient Stated Goal: return home PT Goal Formulation: With patient Time For Goal Achievement: 04/18/19 Potential to Achieve Goals: Good    Frequency     Barriers to discharge        Co-evaluation               AM-PAC PT "6 Clicks" Mobility  Outcome Measure Help needed turning from your back to your side while in a flat bed without using bedrails?: None Help needed moving from lying on your back to sitting on the side of a flat bed without using bedrails?: A Little Help needed moving to and from a bed to a chair (including a wheelchair)?: A Little Help needed standing up from a chair using your arms (e.g., wheelchair or bedside chair)?: None Help needed to walk in hospital room?: A Little Help needed climbing 3-5 steps with a railing? : A Little 6 Click Score: 20    End of Session Equipment Utilized During Treatment: Gait belt;Other (comment)(R post op shoe) Activity Tolerance: Patient tolerated treatment well Patient left: in chair;with call bell/phone within reach Nurse Communication: Mobility status;Other (comment)(would benefit from smaller correctly sized post-op shoe) PT Visit Diagnosis: Other abnormalities of gait and mobility (R26.89)  Time: [id number] PT Time Calculation (min) (ACUTE ONLY): 14 min   Charges:   PT Evaluation $PT Eval Low Complexity: 1 Low          Jamar Casagrande R. Graylon Good, PT, DPT 04/04/19, 10:57 AM

## 2019-04-04 NOTE — Plan of Care (Signed)

## 2019-04-06 LAB — SURGICAL PATHOLOGY

## 2019-04-07 LAB — AEROBIC/ANAEROBIC CULTURE W GRAM STAIN (SURGICAL/DEEP WOUND): Gram Stain: NONE SEEN

## 2019-04-10 LAB — AEROBIC/ANAEROBIC CULTURE W GRAM STAIN (SURGICAL/DEEP WOUND)
Culture: NORMAL
Gram Stain: NONE SEEN

## 2019-05-26 ENCOUNTER — Other Ambulatory Visit: Payer: Self-pay | Admitting: Family Medicine

## 2019-05-26 DIAGNOSIS — R1084 Generalized abdominal pain: Secondary | ICD-10-CM

## 2019-06-10 ENCOUNTER — Ambulatory Visit
Admission: RE | Admit: 2019-06-10 | Discharge: 2019-06-10 | Disposition: A | Discharge status: Home / Self Care | Payer: Medicare HMO | Source: Ambulatory Visit | Attending: Family Medicine | Admitting: Family Medicine

## 2019-06-10 ENCOUNTER — Other Ambulatory Visit: Payer: Self-pay

## 2019-06-10 DIAGNOSIS — R1084 Generalized abdominal pain: Secondary | ICD-10-CM | POA: Diagnosis not present

## 2019-06-10 LAB — POCT I-STAT CREATININE: Creatinine, Ser: 1.1 mg/dL (ref 0.61–1.24)

## 2019-06-10 MED ORDER — IOHEXOL 300 MG/ML  SOLN
100.0000 mL | Freq: Once | INTRAMUSCULAR | Status: AC | PRN
  Administered 2019-06-10: 16:00:00 100 mL via INTRAVENOUS

## 2019-06-14 ENCOUNTER — Other Ambulatory Visit: Payer: Self-pay | Admitting: Family Medicine

## 2019-06-14 DIAGNOSIS — R1084 Generalized abdominal pain: Secondary | ICD-10-CM

## 2019-06-25 ENCOUNTER — Emergency Department: Payer: Medicare HMO

## 2019-06-25 ENCOUNTER — Emergency Department
Admission: EM | Admit: 2019-06-25 | Discharge: 2019-06-25 | Disposition: A | Discharge status: Home / Self Care | Payer: Medicare HMO | Attending: Emergency Medicine | Admitting: Emergency Medicine

## 2019-06-25 ENCOUNTER — Other Ambulatory Visit: Payer: Self-pay

## 2019-06-25 ENCOUNTER — Encounter: Payer: Self-pay | Admitting: Emergency Medicine

## 2019-06-25 DIAGNOSIS — M549 Dorsalgia, unspecified: Secondary | ICD-10-CM | POA: Insufficient documentation

## 2019-06-25 DIAGNOSIS — I10 Essential (primary) hypertension: Secondary | ICD-10-CM | POA: Insufficient documentation

## 2019-06-25 DIAGNOSIS — K859 Acute pancreatitis without necrosis or infection, unspecified: Secondary | ICD-10-CM | POA: Diagnosis not present

## 2019-06-25 DIAGNOSIS — E119 Type 2 diabetes mellitus without complications: Secondary | ICD-10-CM | POA: Insufficient documentation

## 2019-06-25 DIAGNOSIS — Z7982 Long term (current) use of aspirin: Secondary | ICD-10-CM | POA: Diagnosis not present

## 2019-06-25 DIAGNOSIS — Z89421 Acquired absence of other right toe(s): Secondary | ICD-10-CM | POA: Insufficient documentation

## 2019-06-25 DIAGNOSIS — Z7984 Long term (current) use of oral hypoglycemic drugs: Secondary | ICD-10-CM | POA: Insufficient documentation

## 2019-06-25 DIAGNOSIS — Z89422 Acquired absence of other left toe(s): Secondary | ICD-10-CM | POA: Diagnosis not present

## 2019-06-25 DIAGNOSIS — E1142 Type 2 diabetes mellitus with diabetic polyneuropathy: Secondary | ICD-10-CM | POA: Diagnosis not present

## 2019-06-25 DIAGNOSIS — R109 Unspecified abdominal pain: Secondary | ICD-10-CM

## 2019-06-25 DIAGNOSIS — R1011 Right upper quadrant pain: Secondary | ICD-10-CM | POA: Diagnosis present

## 2019-06-25 DIAGNOSIS — Z79899 Other long term (current) drug therapy: Secondary | ICD-10-CM | POA: Insufficient documentation

## 2019-06-25 LAB — URINALYSIS, COMPLETE (UACMP) WITH MICROSCOPIC
Bacteria, UA: NONE SEEN
Bilirubin Urine: NEGATIVE
Glucose, UA: >=500 mg/dL — AB
Hgb urine dipstick: NEGATIVE
Ketones, ur: NEGATIVE mg/dL
Leukocytes,Ua: NEGATIVE
Nitrite: NEGATIVE
Protein, ur: NEGATIVE mg/dL
Specific Gravity, Urine: 1.014 (ref 1.005–1.030)
pH: 6 (ref 5.0–8.0)

## 2019-06-25 LAB — HEPATIC FUNCTION PANEL
ALT: 17 U/L (ref 0–44)
AST: 17 U/L (ref 15–41)
Albumin: 3.7 g/dL (ref 3.5–5.0)
Alkaline Phosphatase: 75 U/L (ref 38–126)
Bilirubin, Direct: <0.1 mg/dL (ref 0.0–0.2)
Total Bilirubin: 0.3 mg/dL (ref 0.3–1.2)
Total Protein: 7.2 g/dL (ref 6.5–8.1)

## 2019-06-25 LAB — BASIC METABOLIC PANEL
Anion gap: 12 (ref 5–15)
BUN: 18 mg/dL (ref 6–20)
CO2: 26 mmol/L (ref 22–32)
Calcium: 9.3 mg/dL (ref 8.9–10.3)
Chloride: 97 mmol/L — ABNORMAL LOW (ref 98–111)
Creatinine, Ser: 0.98 mg/dL (ref 0.61–1.24)
GFR calc Af Amer: >60 mL/min (ref >60)
GFR calc non Af Amer: >60 mL/min (ref >60)
Glucose, Bld: 274 mg/dL — ABNORMAL HIGH (ref 70–99)
Potassium: 4 mmol/L (ref 3.5–5.1)
Sodium: 135 mmol/L (ref 135–145)

## 2019-06-25 LAB — CBC
HCT: 38 % — ABNORMAL LOW (ref 39.0–52.0)
Hemoglobin: 12.9 g/dL — ABNORMAL LOW (ref 13.0–17.0)
MCH: 30.9 pg (ref 26.0–34.0)
MCHC: 33.9 g/dL (ref 30.0–36.0)
MCV: 91.1 fL (ref 80.0–100.0)
Platelets: 259 10*3/uL (ref 150–400)
RBC: 4.17 MIL/uL — ABNORMAL LOW (ref 4.22–5.81)
RDW: 12.4 % (ref 11.5–15.5)
WBC: 9.3 10*3/uL (ref 4.0–10.5)
nRBC: 0 % (ref 0.0–0.2)

## 2019-06-25 LAB — LIPASE, BLOOD: Lipase: 182 U/L — ABNORMAL HIGH (ref 11–51)

## 2019-06-25 LAB — TROPONIN I (HIGH SENSITIVITY): Troponin I (High Sensitivity): 9 ng/L (ref <18)

## 2019-06-25 MED ORDER — PREDNISONE 20 MG PO TABS
60.0000 mg | ORAL_TABLET | Freq: Once | ORAL | Status: AC
  Administered 2019-06-25: 60 mg via ORAL
  Filled 2019-06-25: qty 3

## 2019-06-25 MED ORDER — OXYCODONE-ACETAMINOPHEN 5-325 MG PO TABS: 1.0000 | ORAL_TABLET | Freq: Four times a day (QID) | ORAL | 0 refills | Status: DC | PRN

## 2019-06-25 MED ORDER — PREDNISONE 10 MG (21) PO TBPK: ORAL_TABLET | ORAL | 0 refills | Status: DC

## 2019-06-25 NOTE — ED Notes (Signed)
Pt verbalized understanding of discharge instructions. NAD at this time. 

## 2019-06-25 NOTE — ED Provider Notes (Signed)
[facility] Emergency Department Provider Note  ____________________________________________   I have reviewed the triage vital signs and the nursing notes.   HISTORY  Chief Complaint Chest Pain   History limited by: Not Limited   HPI [name] is a 58 y.o. male who presents to the emergency department today with concerns for abdominal and back pain.  Patient has had abdominal pain for the past month.  Is located in the center and right upper quadrant.  He states that he also has started developing back pain.  The pain is worse when he moves.  He has not noticed any significant change with eating.  He has not had any nausea or vomiting with this.  The patient denies any trauma, denies any heavy lifting. The patient has seen his PCP for the pain and had CT scan done roughly 2 weeks ago without any obvious abnormality. Patient does state that he drinks alcohol and has been drinking slightly more alcohol than normal lately because of the pain.   Records reviewed. Per medical record review patient has a history of HTN, DM  Past Medical History:  Diagnosis Date  . Arthritis   . Bacterial infection due to Morganella morganii 12/26/2017  . BPH (benign prostatic hypertrophy)   . Diabetes mellitus   . Diabetic neuropathy ([place])   . Hypertension   . Metatarsal bone fracture    left 5th toe  . Osteomyelitis of toe of right foot ([place])   . Peripheral vascular disease ([place])   . Post-operative infection    and diabetic ulcer left foot  . Sepsis due to Streptococcus, group B ([place]) 12/26/2017  . Sleep apnea     does wear CPAP  . Wears glasses     Patient Active Problem List   Diagnosis Date Noted  . Chronic anticoagulationn (PLAVIX) 03/15/2018  . Diabetes mellitus due to underlying condition, uncontrolled, with diabetic neuropathy ([place]) 03/10/2018  . Type 2 diabetes mellitus with diabetic polyneuropathy, with long-term current use of insulin ([place]) 03/10/2018  .  Diabetic polyneuropathy associated with type 2 diabetes mellitus ([place]) 03/10/2018  . Atherosclerosis of native arteries of the extremities with ulceration ([place]) 02/24/2018  . Sleep apnea 02/24/2018  . Diabetic peripheral neuropathy ([place]) 02/24/2018  . Vitamin D insufficiency 02/24/2018  . Neurogenic pain 02/24/2018  . Chronic foot pain (Primary Area of Pain) (Bilateral) (L>R) 02/10/2018  . Chronic lower extremity pain (Secondary Area of Pain) (Bilateral) (L>R) 02/10/2018  . Chronic pain syndrome 02/10/2018  . Long term current use of opiate analgesic 02/10/2018  . Pharmacologic therapy 02/10/2018  . Disorder of skeletal system 02/10/2018  . Problems influencing health status 02/10/2018  . Bacterial infection due to Morganella morganii 12/26/2017  . Sepsis due to Streptococcus, group B ([place]) 12/26/2017  . HTN (hypertension) 11/29/2017  . HLD (hyperlipidemia) 11/29/2017  . Osteomyelitis ([place]) 11/29/2017  . Cellulitis of foot (Left) 03/05/2017  . Surgery, elective   . Hardware complicating wound infection ([place])   . Metatarsal stress fracture of left foot 01/12/2016  . Diabetic foot infection ([place]) 01/12/2016  . Diabetic ulcer of foot associated with type 2 diabetes mellitus, with necrosis of muscle ([place]) 01/11/2016    Past Surgical History:  Procedure Laterality Date  . AMPUTATION TOE Left    small toe  . AMPUTATION TOE Right 04/02/2019   Procedure: AMPUTATION TOE 2ND;  Surgeon: Samara Deist, DPM;  Location: [facility];  Service: Podiatry;  Laterality: Right;  . COLONOSCOPY W/ BIOPSIES AND POLYPECTOMY    .  I & D EXTREMITY Left 01/11/2016   Procedure: LEFT FOOT IRRIGATION AND DEBRIDEMENT WOUND VAC AND REMOVAL OF HARDWARE;  Surgeon: Wylene Simmer, MD;  Location: [place];  Service: Orthopedics;  Laterality: Left;  . LOWER EXTREMITY ANGIOGRAPHY N/A 12/01/2017   Procedure: Lower Extremity Angiography;  Surgeon: Algernon Huxley, MD;  Location: [place] [facility];  Service: Cardiovascular;   Laterality: N/A;  . LOWER EXTREMITY ANGIOGRAPHY Right 12/04/2017   Procedure: Lower Extremity Angiography;  Surgeon: Algernon Huxley, MD;  Location: [place] [facility];  Service: Cardiovascular;  Laterality: Right;  . LOWER EXTREMITY ANGIOGRAPHY Left 03/12/2018   Procedure: LOWER EXTREMITY ANGIOGRAPHY;  Surgeon: Algernon Huxley, MD;  Location: [place] [facility];  Service: Cardiovascular;  Laterality: Left;  . ORIF TOE FRACTURE Left 06/08/2015   Procedure: OPEN REDUCTION INTERNAL FIXATION (ORIF) LEFT FIFTH METATARSAL BASE FRACTURE NONUNION; CALCANEAL AUTOGRAFT ;  Surgeon: Wylene Simmer, MD;  Location: [place];  Service: Orthopedics;  Laterality: Left;  . PATELLA RECONSTRUCTION Left 2005  . PROSTATE ABLATION  2014  . TOE AMPUTATION     partial amputation right great toe  . WOUND DEBRIDEMENT Bilateral 11/30/2017   Procedure: DEBRIDEMENT WOUND;  Surgeon: Sharlotte Alamo, DPM;  Location: [facility];  Service: Podiatry;  Laterality: Bilateral;    Prior to Admission medications   Medication Sig Start Date End Date Taking? Authorizing Provider  acetaminophen (TYLENOL) 325 MG tablet Take 2 tablets (650 mg total) by mouth every 6 (six) hours as needed for mild pain (or Fever >/= 101). 04/04/19   Gouru, Illene Silver, MD  amLODipine (NORVASC) 10 MG tablet Take 10 mg by mouth daily. 03/27/19   [provider]  aspirin EC 81 MG tablet Take 81 mg by mouth daily.    [provider]  Calcium Carbonate-Vit D-Min (GNP CALCIUM 1200) 1200-1000 MG-UNIT CHEW Chew 1,200 mg by mouth daily with breakfast. Take in combination with vitamin D and magnesium. 04/04/19 10/01/19  Nicholes Mango, MD  doxycycline (VIBRA-TABS) 100 MG tablet Take 1 tablet (100 mg total) by mouth every 12 (twelve) hours. 04/04/19   Gouru, Illene Silver, MD  hydrochlorothiazide (HYDRODIURIL) 25 MG tablet Take 25 mg by mouth daily. 03/07/19   [provider]  lactobacillus acidophilus (BACID) TABS tablet Take 2 tablets by mouth 3 (three) times  daily. 04/04/19   Gouru, Illene Silver, MD  losartan (COZAAR) 100 MG tablet Take 100 mg by mouth daily.     [provider]  metFORMIN (GLUCOPHAGE) 1000 MG tablet Take 1,000 mg by mouth 2 (two) times daily. 10/21/15   [provider]  oxyCODONE (OXY IR/ROXICODONE) 5 MG immediate release tablet Take 1 tablet (5 mg total) by mouth every 4 (four) hours as needed for moderate pain. 04/04/19   Gouru, Illene Silver, MD  simvastatin (ZOCOR) 20 MG tablet Take 20 mg by mouth daily. 02/23/19   [provider]    Allergies Bactrim [sulfamethoxazole-trimethoprim]  Family History  Problem Relation Age of Onset  . Diabetes Mother   . Diabetes Other     Social History Social History   Tobacco Use  . Smoking status: Never Smoker  . Smokeless tobacco: Never Used  Substance Use Topics  . Alcohol use: Yes    Comment: occasional   . Drug use: Yes    Types: Marijuana, Cocaine    Comment: last month    Review of Systems Constitutional: No fever/chills Eyes: No visual changes. ENT: No sore throat. Cardiovascular: Denies chest pain. Respiratory: Denies shortness of breath.  Gastrointestinal: Positive for abdominal pain Genitourinary: Negative for dysuria. Musculoskeletal: Positive for back pain. Skin: Negative for rash. Neurological: Negative for headaches, focal weakness or numbness.  ____________________________________________   PHYSICAL EXAM:  VITAL SIGNS: ED Triage Vitals  Enc Vitals Group     BP 06/25/19 0443 (!) 154/75     Pulse Rate 06/25/19 0443 96     Resp 06/25/19 0443 20     Temp 06/25/19 0443 98 F (36.7 C)     Temp Source 06/25/19 0443 Oral     SpO2 06/25/19 0443 99 %     Weight 06/25/19 0444 236 lb (107 kg)     Height 06/25/19 0444 5\' 10"  (1.778 m)     Head Circumference --      Peak Flow --      Pain Score 06/25/19 0443 9   Constitutional: Alert and oriented.  Eyes: Conjunctivae are normal.  ENT      Head: Normocephalic and atraumatic.      Nose: No  congestion/rhinnorhea.      Mouth/Throat: Mucous membranes are moist.      Neck: No stridor. Hematological/Lymphatic/Immunilogical: No cervical lymphadenopathy. Cardiovascular: Normal rate, regular rhythm.  No murmurs, rubs, or gallops. Respiratory: Normal respiratory effort without tachypnea nor retractions. Breath sounds are clear and equal bilaterally. No wheezes/rales/rhonchi. Gastrointestinal: Soft and minimally tender in the epigastric region.  Genitourinary: Deferred Musculoskeletal: Normal range of motion in all extremities. No lower extremity edema. No spinal tenderness. Neurologic:  Normal speech and language. No gross focal neurologic deficits are appreciated.  Skin:  Skin is warm, dry and intact. No rash noted. Psychiatric: Mood and affect are normal. Speech and behavior are normal. Patient exhibits appropriate insight and judgment.  ____________________________________________    LABS (pertinent positives/negatives)  UA clear, 0-5 rbc and wbc LFTs wnl Lipase 182 CBC wbc 9.3, hgb 12.9, plt 259 BMP wnl except cl 97, glu 274 Trop hs 9 ____________________________________________   EKG  I, Nance Pear, attending physician, personally viewed and interpreted this EKG  EKG Time: 0438 Rate: 95 Rhythm: normal sinus rhythm Axis: normal Intervals: qtc 444 QRS: narrow ST changes: no st elevation Impression: normal ekg  ____________________________________________    RADIOLOGY  CXR No acute abnormality  ____________________________________________   PROCEDURES  Procedures  ____________________________________________   INITIAL IMPRESSION / ASSESSMENT AND PLAN / ED COURSE  Pertinent labs & imaging results that were available during my care of the patient were reviewed by me and considered in my medical decision making (see chart for details).   Patient presented to the emergency department today with complaints of continued abdominal pain as well as  back pain.  The patient did not have any spinal tenderness.  He did appear to be in pain with movement.  I did review of the CT scan performed 2 weeks ago.  This did not show any acute abnormalities.  No gallbladder abnormalities on the CT scan.  Patient had a mild lipase elevation today which I think could be related to his alcohol use.  I discussed this with the patient. In terms of the back pain wonder if it is either related to the pancreatitis or possible disc disease. No evidence of spinal cord lesion. Will plan on treating with pain medication and steroids. Discussed importance of follow up with PCP.   ___________________________________________   FINAL CLINICAL IMPRESSION(S) / ED DIAGNOSES  Final diagnoses:  Abdominal pain, unspecified abdominal location  Back pain, unspecified back location, unspecified back pain laterality, unspecified chronicity  Acute  pancreatitis, unspecified complication status, unspecified pancreatitis type     Note: This dictation was prepared with Dragon dictation. Any transcriptional errors that result from this process are unintentional     Nance Pear, MD 06/25/19 [phone]

## 2019-06-25 NOTE — ED Triage Notes (Signed)
Patient to ER for c/o chest pain, back pain (upper and lower back pain), abd pain x1 week. Denies any fevers.

## 2019-06-25 NOTE — Discharge Instructions (Addendum)
Please keep a close watch of your sugar levels, the steroid medication typically will elevated blood sugar levels. Please seek medical attention for any high fevers, chest pain, shortness of breath, change in behavior, persistent vomiting, bloody stool or any other new or concerning symptoms.

## 2019-06-25 NOTE — ED Notes (Signed)
Unable to urinate at this time.  

## 2019-07-26 ENCOUNTER — Other Ambulatory Visit: Payer: Self-pay

## 2019-07-26 ENCOUNTER — Encounter: Payer: Self-pay | Admitting: Gastroenterology

## 2019-07-26 ENCOUNTER — Ambulatory Visit (INDEPENDENT_AMBULATORY_CARE_PROVIDER_SITE_OTHER): Payer: Medicare HMO | Admitting: Gastroenterology

## 2019-07-26 VITALS — BP 177/91 | HR 84 | Temp 98.0°F | Resp 17 | Ht 70.0 in | Wt 234.6 lb

## 2019-07-26 DIAGNOSIS — Z1211 Encounter for screening for malignant neoplasm of colon: Secondary | ICD-10-CM

## 2019-07-26 DIAGNOSIS — Z86010 Personal history of colonic polyps: Secondary | ICD-10-CM

## 2019-07-26 DIAGNOSIS — K760 Fatty (change of) liver, not elsewhere classified: Secondary | ICD-10-CM

## 2019-07-26 DIAGNOSIS — K852 Alcohol induced acute pancreatitis without necrosis or infection: Secondary | ICD-10-CM | POA: Diagnosis not present

## 2019-07-26 DIAGNOSIS — K8689 Other specified diseases of pancreas: Secondary | ICD-10-CM | POA: Diagnosis not present

## 2019-07-26 NOTE — Patient Instructions (Signed)

## 2019-07-26 NOTE — Progress Notes (Signed)
Cephas Darby, MD [address]  [place]  [place], [place] [address]  Main: [phone]  Fax: [phone]    Gastroenterology Consultation  Referring Provider:     Theotis Burrow* Primary Care Physician:  Theotis Burrow, MD Primary Gastroenterologist:  Dr. Cephas Darby Reason for Consultation: Acute pancreatitis        HPI:   [name] is a 59 y.o. male referred by Dr. Alene Mires, Elyse Jarvis, MD  for consultation & management of acute pancreatitis.  Patient originally presented to [facility] ER on 06/25/2019 with severe epigastric pain radiating to the back.  At that time, labs revealed elevated serum lipase 182, troponin negative, LFTs, BMP and CBC unremarkable.  UA revealed elevated glucose.  He underwent CT abdomen pelvis with contrast which revealed no evidence of cholelithiasis or biliary dilation, pancreas was unremarkable.  Patient reported history of heavy alcohol use prior to this episode.  He was discharged home on pain medication with diagnosis of alcohol induced acute pancreatitis.  Patient had another episode that prompted him to go to [facility] ER on 07/13/2019 with worsening of epigastric pain radiating to the back and lower abdomen.  This was worse compared to the previous episode.  Labs were significant for proteinuria, lipase of 393, normal CMP and CBC.  He underwent repeat CT abdomen and pelvis with contrast which revealed diffuse hepatic steatosis, mild diffuse fatty atrophy of the pancreas with no focal mass lesion or ductal dilatation.  There were prominent lymph nodes within the celiac axis and porta hepatis which were thought to be nonspecific and reactive and not enlarged by size criteria.  During the second event, patient did not drink alcohol however he continued to consume heavy fatty foods.  He was instructed to stay on liquids and low calorie foods, avoid fat.  He reports that he was drinking alcohol heavily including liquor and beer around  holidays prior to these episodes.  At baseline, he consumes about 2 beers daily and does eat high calorie, high carb foods.  He has type 2 diabetes, insulin-dependent which is uncontrolled.  Since discharge from [facility], patient lost about 8 pounds by following low calorie foods.  He was mostly liquid diet until recently when he started eating grilled chicken and more fruits and vegetables.  He reports the pain has significantly improved.  He does report constipation for which she is taking stool softeners as needed.  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  He reports that he had lipids checked by his PCP which came down to 131.  He was told to stop simvastatin. He reports that his triglycerides are not very high  NSAIDs: None  Antiplts/Anticoagulants/Anti thrombotics: None  GI Procedures: Reports having had a colonoscopy for colon cancer screening about 5 to 6 years ago in [place] by Dr. Juanita Craver and 4 polyps were detected He denies family history of pancreatic malignancy or any other GI malignancy  Past Medical History:  Diagnosis Date  . Arthritis   . Bacterial infection due to Morganella morganii 12/26/2017  . BPH (benign prostatic hypertrophy)   . Diabetes mellitus   . Diabetic neuropathy ([place])   . Hypertension   . Metatarsal bone fracture    left 5th toe  . Osteomyelitis of toe of right foot ([place])   . Peripheral vascular disease ([place])   .  Post-operative infection    and diabetic ulcer left foot  . Sepsis due to Streptococcus, group B ([place]) 12/26/2017  . Sleep apnea     does wear CPAP  . Wears glasses     Past Surgical History:  Procedure Laterality Date  . AMPUTATION TOE Left    small toe  . AMPUTATION TOE Right 04/02/2019   Procedure: AMPUTATION TOE 2ND;  Surgeon: Samara Deist, DPM;  Location: [facility];  Service: Podiatry;  Laterality: Right;    . COLONOSCOPY W/ BIOPSIES AND POLYPECTOMY    . I & D EXTREMITY Left 01/11/2016   Procedure: LEFT FOOT IRRIGATION AND DEBRIDEMENT WOUND VAC AND REMOVAL OF HARDWARE;  Surgeon: Wylene Simmer, MD;  Location: [place];  Service: Orthopedics;  Laterality: Left;  . LOWER EXTREMITY ANGIOGRAPHY N/A 12/01/2017   Procedure: Lower Extremity Angiography;  Surgeon: Algernon Huxley, MD;  Location: [place] [facility];  Service: Cardiovascular;  Laterality: N/A;  . LOWER EXTREMITY ANGIOGRAPHY Right 12/04/2017   Procedure: Lower Extremity Angiography;  Surgeon: Algernon Huxley, MD;  Location: [place] [facility];  Service: Cardiovascular;  Laterality: Right;  . LOWER EXTREMITY ANGIOGRAPHY Left 03/12/2018   Procedure: LOWER EXTREMITY ANGIOGRAPHY;  Surgeon: Algernon Huxley, MD;  Location: [place] [facility];  Service: Cardiovascular;  Laterality: Left;  . ORIF TOE FRACTURE Left 06/08/2015   Procedure: OPEN REDUCTION INTERNAL FIXATION (ORIF) LEFT FIFTH METATARSAL BASE FRACTURE NONUNION; CALCANEAL AUTOGRAFT ;  Surgeon: Wylene Simmer, MD;  Location: [place];  Service: Orthopedics;  Laterality: Left;  . PATELLA RECONSTRUCTION Left 2005  . PROSTATE ABLATION  2014  . TOE AMPUTATION     partial amputation right great toe  . WOUND DEBRIDEMENT Bilateral 11/30/2017   Procedure: DEBRIDEMENT WOUND;  Surgeon: Sharlotte Alamo, DPM;  Location: [facility];  Service: Podiatry;  Laterality: Bilateral;    Current Outpatient Medications:  .  acetaminophen (TYLENOL) 325 MG tablet, Take 2 tablets (650 mg total) by mouth every 6 (six) hours as needed for mild pain (or Fever >/= 101)., Disp:  , Rfl:  .  aspirin EC 81 MG tablet, Take 81 mg by mouth daily., Disp: , Rfl:  .  hydrochlorothiazide (HYDRODIURIL) 25 MG tablet, Take 25 mg by mouth daily., Disp: , Rfl:  .  losartan (COZAAR) 100 MG tablet, Take 100 mg by mouth daily. , Disp: , Rfl:  .  amLODipine (NORVASC) 10 MG tablet, Take 10 mg by mouth daily., Disp: , Rfl:  .  Calcium  Carbonate-Vit D-Min (GNP CALCIUM 1200) 1200-1000 MG-UNIT CHEW, Chew 1,200 mg by mouth daily with breakfast. Take in combination with vitamin D and magnesium. (Patient not taking: Reported on 07/26/2019), Disp: 30 tablet, Rfl: 5 .  simvastatin (ZOCOR) 20 MG tablet, Take 20 mg by mouth daily., Disp: , Rfl:  No current facility-administered medications for this visit.  Facility-Administered Medications Ordered in Other Visits:  .  Tdap (BOOSTRIX) injection 0.5 mL, 0.5 mL, Intramuscular, Once, Betancourt, Tina A, NP    Family History  Problem Relation Age of Onset  . Diabetes Mother   . Diabetes Other      Social History   Tobacco Use  . Smoking status: Never Smoker  . Smokeless tobacco: Never Used  Substance Use Topics  . Alcohol use: Yes    Comment: occasional   . Drug use: Yes    Types: Marijuana, Cocaine    Comment: last month    Allergies as of 07/26/2019 - Review Complete 07/26/2019  Allergen Reaction  Noted  . Bactrim [sulfamethoxazole-trimethoprim] Hives 11/04/2017    Review of Systems:    All systems reviewed and negative except where noted in HPI.   Physical Exam:  BP (!) 177/91 (BP Location: Left Arm, Patient Position: Sitting, Cuff Size: Large)   Pulse 84   Temp 98 F (36.7 C)   Resp 17   Ht 5\' 10"  (1.778 m)   Wt 234 lb 9.6 oz (106.4 kg)   BMI 33.66 kg/m  No LMP for male patient.  General:   Alert,  Well-developed, well-nourished, pleasant and cooperative in NAD Head:  Normocephalic and atraumatic. Eyes:  Sclera clear, no icterus.   Conjunctiva pink. Ears:  Normal auditory acuity. Nose:  No deformity, discharge, or lesions. Mouth:  No deformity or lesions,oropharynx pink & moist. Neck:  Supple; no masses or thyromegaly. Lungs:  Respirations even and unlabored.  Clear throughout to auscultation.   No wheezes, crackles, or rhonchi. No acute distress. Heart:  Regular rate and rhythm; no murmurs, clicks, rubs, or gallops. Abdomen:  Normal bowel sounds. Soft,  mild tenderness in epigastric region and non-distended without masses, hepatosplenomegaly or hernias noted.  No guarding or rebound tenderness.   Rectal: Not performed Msk:  Symmetrical without gross deformities. Good, equal movement & strength bilaterally. Pulses:  Normal pulses noted. Extremities:  No clubbing or edema.  No cyanosis. Neurologic:  Alert and oriented x3;  grossly normal neurologically. Skin:  Intact without significant lesions or rashes. No jaundice. Psych:  Alert and cooperative. Normal mood and affect.  Imaging Studies: Reviewed  Assessment and Plan:   [name] is a 59 y.o. male with metabolic syndrome, uncontrolled diabetes, insulin-dependent, last hemoglobin A1c 7.7 in 04/2019 is seen in consultation for recent attack of acute pancreatitis without infection or necrosis.  Patient acknowledges history of heavy alcohol use within last 1 month which is probably the etiology of acute pancreatitis.  There is no evidence of cholelithiasis, pancreatic malignancy based on cross-sectional imaging.  He does have fatty atrophic pancreas most likely secondary to known history of long-term diabetes and cautioned him about risk for chronic pancreatitis, pancreatic insufficiency from pancreatic atrophy if his diabetes remains uncontrolled Advised him to continue high-fiber, low-fat, low-carb diet which would be the management for fatty liver, fatty pancreas along with diabetes Remain abstinent from alcohol use Resume simvastatin Tight control of diabetes  Personal history of colon polyps based on colonoscopy about 5 to 6 years ago Discussed with him about surveillance colonoscopy and patient is agreeable   Follow up in 2 to 3 months   Cephas Darby, MD

## 2019-08-23 ENCOUNTER — Other Ambulatory Visit: Payer: Self-pay | Admitting: Family Medicine

## 2019-08-23 ENCOUNTER — Telehealth: Payer: Self-pay | Admitting: Gastroenterology

## 2019-08-23 DIAGNOSIS — M5431 Sciatica, right side: Secondary | ICD-10-CM

## 2019-08-23 NOTE — Telephone Encounter (Signed)
Called ENDO unit and canceled procedure.

## 2019-08-23 NOTE — Telephone Encounter (Signed)
Patient called & needs to cancel his colonoscopy for 08-30-2019. He just recovered from Pancreatitis & now with snyaptic pain in back leg area. He will call & r/s once he is feeling better.

## 2019-08-30 ENCOUNTER — Ambulatory Visit: Admission: RE | Admit: 2019-08-30 | Payer: Medicare HMO | Source: Home / Self Care | Admitting: Gastroenterology

## 2019-08-30 ENCOUNTER — Encounter: Admission: RE | Payer: Self-pay | Source: Home / Self Care

## 2019-08-30 SURGERY — COLONOSCOPY WITH PROPOFOL
Anesthesia: Choice

## 2019-09-01 ENCOUNTER — Ambulatory Visit: Admission: RE | Admit: 2019-09-01 | Payer: Medicare HMO | Source: Ambulatory Visit

## 2019-09-07 ENCOUNTER — Telehealth: Payer: Self-pay | Admitting: Family Medicine

## 2019-09-08 ENCOUNTER — Ambulatory Visit: Payer: Medicare HMO

## 2019-09-16 ENCOUNTER — Ambulatory Visit: Payer: Medicare HMO

## 2019-09-21 ENCOUNTER — Ambulatory Visit: Payer: Medicare HMO | Admitting: Gastroenterology

## 2019-09-27 ENCOUNTER — Ambulatory Visit: Payer: Medicare HMO | Admitting: Gastroenterology

## 2019-10-05 ENCOUNTER — Other Ambulatory Visit: Payer: Self-pay

## 2019-10-05 ENCOUNTER — Ambulatory Visit
Admission: RE | Admit: 2019-10-05 | Discharge: 2019-10-05 | Disposition: A | Discharge status: Home / Self Care | Payer: Medicare HMO | Source: Ambulatory Visit | Attending: Family Medicine | Admitting: Family Medicine

## 2019-10-05 DIAGNOSIS — M5431 Sciatica, right side: Secondary | ICD-10-CM | POA: Insufficient documentation

## 2019-12-01 ENCOUNTER — Ambulatory Visit: Payer: Medicare HMO | Admitting: Pain Medicine

## 2020-02-02 ENCOUNTER — Other Ambulatory Visit: Payer: Self-pay | Admitting: Gastroenterology

## 2020-05-15 ENCOUNTER — Encounter: Payer: Self-pay | Admitting: Physician Assistant

## 2020-05-18 NOTE — Progress Notes (Signed)
Attempted to obtain medical history via telephone, unable to reach at this time. I left a voicemail to return pre surgical testing department's phone call.  

## 2020-06-01 ENCOUNTER — Encounter (HOSPITAL_COMMUNITY): Admission: RE | Payer: Self-pay | Source: Home / Self Care

## 2020-06-01 ENCOUNTER — Ambulatory Visit (HOSPITAL_COMMUNITY): Admission: RE | Admit: 2020-06-01 | Payer: Medicare HMO | Source: Home / Self Care | Admitting: Gastroenterology

## 2020-06-01 SURGERY — COLONOSCOPY WITH PROPOFOL
Anesthesia: Monitor Anesthesia Care

## 2020-07-06 ENCOUNTER — Other Ambulatory Visit: Payer: Self-pay

## 2020-07-06 ENCOUNTER — Ambulatory Visit
Admission: RE | Admit: 2020-07-06 | Discharge: 2020-07-06 | Disposition: A | Discharge status: Home / Self Care | Payer: Medicare HMO | Attending: Physician Assistant | Admitting: Physician Assistant

## 2020-07-06 ENCOUNTER — Other Ambulatory Visit: Payer: Self-pay | Admitting: Physician Assistant

## 2020-07-06 ENCOUNTER — Ambulatory Visit
Admission: RE | Admit: 2020-07-06 | Discharge: 2020-07-06 | Disposition: A | Discharge status: Home / Self Care | Payer: Medicare HMO | Source: Ambulatory Visit | Attending: Physician Assistant | Admitting: Physician Assistant

## 2020-07-06 DIAGNOSIS — M2540 Effusion, unspecified joint: Secondary | ICD-10-CM

## 2020-07-17 NOTE — Progress Notes (Unsigned)
MRN : [id number]  [name] is a 60 y.o. (09-04-1960) male who presents with chief complaint of sore on my foot.  History of Present Illness:   The patient is seen for evaluation of painful lower extremities and diminished pulses associated with ulceration of the right  foot.  The patient notes the ulcer has essentially healed.  He was most recently seen by Dr Vickki Muff for wound care s/p debridement of the right 3rd toe and 5th met.  He is s/p right 2nd toe amputation in October of 2020.  It is not painful but it does have some drainage.  No specific history of trauma noted by the patient.  The patient denies fever or chills.  the patient does have diabetes which has been difficult to control.  Patient notes prior to the ulcer developing the extremities were painful particularly with ambulation or activity and the discomfort is very consistent day today. Typically, the pain occurs at less than one block, progress is as activity continues to the point that the patient must stop walking. Resting including standing still for several minutes allowed resumption of the activity and the ability to walk a similar distance before stopping again. Uneven terrain and inclined shorten the distance. The pain has been progressive over the past several years.   The patient denies rest pain or dangling of an extremity off the side of the bed during the night for relief. No prior interventions or surgeries.  No history of back problems or DJD of the lumbar sacral spine.   The patient denies amaurosis fugax or recent TIA symptoms. There are no recent neurological changes noted. The patient denies history of DVT, PE or superficial thrombophlebitis. The patient denies recent episodes of angina or shortness of breath.   ABIs obtained today demonstrate right equals 1.08 with triphasic posterior tibial signal and left equals 0.90 with biphasic signals.  Essentially there is no change compared to previous study  dated 05/2018  No outpatient medications have been marked as taking for the 07/20/20 encounter (Appointment) with Delana Meyer, [name] Lory, MD.    Past Medical History:  Diagnosis Date  . Arthritis   . Bacterial infection due to Morganella morganii 12/26/2017  . BPH (benign prostatic hypertrophy)   . Diabetes mellitus   . Diabetic neuropathy ([place])   . Hypertension   . Metatarsal bone fracture    left 5th toe  . Osteomyelitis of toe of right foot ([place])   . Peripheral vascular disease ([place])   . Post-operative infection    and diabetic ulcer left foot  . Sepsis due to Streptococcus, group B ([place]) 12/26/2017  . Sleep apnea     does wear CPAP  . Wears glasses     Past Surgical History:  Procedure Laterality Date  . AMPUTATION TOE Left    small toe  . AMPUTATION TOE Right 04/02/2019   Procedure: AMPUTATION TOE 2ND;  Surgeon: Samara Deist, DPM;  Location: [facility];  Service: Podiatry;  Laterality: Right;  . COLONOSCOPY W/ BIOPSIES AND POLYPECTOMY    . I & D EXTREMITY Left 01/11/2016   Procedure: LEFT FOOT IRRIGATION AND DEBRIDEMENT WOUND VAC AND REMOVAL OF HARDWARE;  Surgeon: Wylene Simmer, MD;  Location: [place];  Service: Orthopedics;  Laterality: Left;  . LOWER EXTREMITY ANGIOGRAPHY N/A 12/01/2017   Procedure: Lower Extremity Angiography;  Surgeon: Algernon Huxley, MD;  Location: [place] [facility];  Service: Cardiovascular;  Laterality: N/A;  . LOWER EXTREMITY ANGIOGRAPHY Right 12/04/2017  Procedure: Lower Extremity Angiography;  Surgeon: Algernon Huxley, MD;  Location: [place] [facility];  Service: Cardiovascular;  Laterality: Right;  . LOWER EXTREMITY ANGIOGRAPHY Left 03/12/2018   Procedure: LOWER EXTREMITY ANGIOGRAPHY;  Surgeon: Algernon Huxley, MD;  Location: [place] [facility];  Service: Cardiovascular;  Laterality: Left;  . ORIF TOE FRACTURE Left 06/08/2015   Procedure: OPEN REDUCTION INTERNAL FIXATION (ORIF) LEFT FIFTH METATARSAL BASE FRACTURE NONUNION; CALCANEAL AUTOGRAFT ;  Surgeon: Wylene Simmer, MD;  Location: [place];  Service: Orthopedics;  Laterality: Left;  . PATELLA RECONSTRUCTION Left 2005  . PROSTATE ABLATION  2014  . TOE AMPUTATION     partial amputation right great toe  . WOUND DEBRIDEMENT Bilateral 11/30/2017   Procedure: DEBRIDEMENT WOUND;  Surgeon: Sharlotte Alamo, DPM;  Location: [facility];  Service: Podiatry;  Laterality: Bilateral;    Social History Social History   Tobacco Use  . Smoking status: Never Smoker  . Smokeless tobacco: Never Used  Vaping Use  . Vaping Use: Never used  Substance Use Topics  . Alcohol use: Yes    Comment: occasional   . Drug use: Yes    Types: Marijuana, Cocaine    Comment: last month    Family History Family History  Problem Relation Age of Onset  . Diabetes Mother   . Diabetes Other   No family history of bleeding/clotting disorders, porphyria or autoimmune disease   Allergies  Allergen Reactions  . Bactrim [Sulfamethoxazole-Trimethoprim] Hives     REVIEW OF SYSTEMS (Negative unless checked)  Constitutional: [] Weight loss  [] Fever  [] Chills Cardiac: [] Chest pain   [] Chest pressure   [] Palpitations   [] Shortness of breath when laying flat   [] Shortness of breath with exertion. Vascular:  [x] Pain in legs with walking   [] Pain in legs at rest  [] History of DVT   [] Phlebitis   [] Swelling in legs   [] Varicose veins   [] Non-healing ulcers Pulmonary:   [] Uses home oxygen   [] Productive cough   [] Hemoptysis   [] Wheeze  [] COPD   [] Asthma Neurologic:  [] Dizziness   [] Seizures   [] History of stroke   [] History of TIA  [] Aphasia   [] Vissual changes   [] Weakness or numbness in arm   [x] Weakness or numbness in leg Musculoskeletal:   [] Joint swelling   [] Joint pain   [] Low back pain Hematologic:  [] Easy bruising  [] Easy bleeding   [] Hypercoagulable state   [] Anemic Gastrointestinal:  [] Diarrhea   [] Vomiting  [] Gastroesophageal reflux/heartburn   [] Difficulty swallowing. Genitourinary:  [] Chronic kidney disease    [] Difficult urination  [] Frequent urination   [] Blood in urine Skin:  [] Rashes   [x] Ulcers  Psychological:  [] History of anxiety   []  History of major depression.  Physical Examination  There were no vitals filed for this visit. There is no height or weight on file to calculate BMI. Gen: WD/WN, NAD Head: [place]/AT, No temporalis wasting.  Ear/Nose/Throat: Hearing grossly intact, nares w/o erythema or drainage, poor dentition Eyes: PER, EOMI, sclera nonicteric.  Neck: Supple, no masses.  No bruit or JVD.  Pulmonary:  Good air movement, clear to auscultation bilaterally, no use of accessory muscles.  Cardiac: RRR, normal S1, S2, no Murmurs. Vascular: Mild venous stasis changes without significant edema; no carotid bruits Vessel Right Left  Radial Palpable Palpable  PT Not Palpable Not Palpable  DP Not Palpable Not Palpable  Gastrointestinal: soft, non-distended. No guarding/no peritoneal signs.  Musculoskeletal: M/S 5/5 throughout.  No deformity or atrophy.  Neurologic: CN 2-12  intact. Pain and light touch intact in extremities.  Symmetrical.  Speech is fluent. Motor exam as listed above. Psychiatric: Judgment intact, Mood & affect appropriate for pt's clinical situation. Dermatologic: Mild venous rashes no ulcers noted.  No changes consistent with cellulitis.   CBC Lab Results  Component Value Date   WBC 9.3 06/25/2019   HGB 12.9 (L) 06/25/2019   HCT 38.0 (L) 06/25/2019   MCV 91.1 06/25/2019   PLT 259 06/25/2019    BMET    Component Value Date/Time   NA 135 06/25/2019 0453   NA 139 02/10/2018 1154   NA 134 (L) 03/26/2014 2050   K 4.0 06/25/2019 0453   K 4.3 03/26/2014 2050   CL 97 (L) 06/25/2019 0453   CL 103 03/26/2014 2050   CO2 26 06/25/2019 0453   CO2 29 03/26/2014 2050   GLUCOSE 274 (H) 06/25/2019 0453   GLUCOSE 150 (H) 03/26/2014 2050   BUN 18 06/25/2019 0453   BUN 13 02/10/2018 1154   BUN 18 03/26/2014 2050   CREATININE 0.98 06/25/2019 0453   CREATININE 0.86  03/26/2014 2050   CALCIUM 9.3 06/25/2019 0453   CALCIUM 8.7 03/26/2014 2050   GFRNONAA >60 06/25/2019 0453   GFRNONAA >60 03/26/2014 2050   GFRNONAA >60 10/29/2012 1757   GFRAA >60 06/25/2019 0453   GFRAA >60 03/26/2014 2050   GFRAA >60 10/29/2012 1757   CrCl cannot be calculated (Patient's most recent lab result is older than the maximum 21 days allowed.).  COAG Lab Results  Component Value Date   INR 1.05 01/08/2018    Radiology DG Knee 1-2 Views Right  Result Date: 07/06/2020 CLINICAL DATA:  Knot noted involving the medial aspect the right knee for the past week. EXAM: RIGHT KNEE - 1-2 VIEW COMPARISON:  None. FINDINGS: No fracture or dislocation. Mild tricompartmental degenerative change of the knee with joint space loss, subchondral sclerosis and osteophytosis. Minimal enthesopathic change involving the superior and inferior poles of patella. No joint effusion. Scattered adjacent vascular calcifications. No radiopaque foreign body. IMPRESSION: 1. No definitive radiographic correlate for reported palpable knot involving the medial aspect of the right knee. 2. Mild tricompartmental degenerative change of the knee. 3. Scattered vascular calcifications. Electronically Signed   By: Sandi Mariscal M.D.   On: 07/06/2020 14:59     Assessment/Plan 1. Atherosclerosis of native arteries of the extremities with ulceration ([place])  Recommend:  The patient has evidence of atherosclerosis of the lower extremities with claudication.  The ulcer is now healed.  The patient does not voice lifestyle limiting changes at this point in time.  Noninvasive studies do not suggest clinically significant change.  No invasive studies, angiography or surgery at this time The patient should continue walking and begin a more formal exercise program.  The patient should continue antiplatelet therapy and aggressive treatment of the lipid abnormalities  No changes in the patient's medications at this time  The  patient should continue wearing graduated compression socks 10-15 mmHg strength to control the mild edema.   - VAS [place] ABI WITH/WO TBI; Future  2. Primary hypertension Continue antihypertensive medications as already ordered, these medications have been reviewed and there are no changes at this time.   3. Type 2 diabetes mellitus with diabetic polyneuropathy, with long-term current use of insulin (HCC) Continue hypoglycemic medications as already ordered, these medications have been reviewed and there are no changes at this time.  Hgb A1C to be monitored as already arranged by primary service  4. Hyperlipidemia, unspecified hyperlipidemia type Continue statin as ordered and reviewed, no changes at this time    Hortencia Pilar, MD  07/17/2020 2:11 PM

## 2020-07-18 ENCOUNTER — Other Ambulatory Visit (INDEPENDENT_AMBULATORY_CARE_PROVIDER_SITE_OTHER): Payer: Self-pay | Admitting: Vascular Surgery

## 2020-07-18 DIAGNOSIS — I739 Peripheral vascular disease, unspecified: Secondary | ICD-10-CM

## 2020-07-18 DIAGNOSIS — L97519 Non-pressure chronic ulcer of other part of right foot with unspecified severity: Secondary | ICD-10-CM

## 2020-07-20 ENCOUNTER — Ambulatory Visit (INDEPENDENT_AMBULATORY_CARE_PROVIDER_SITE_OTHER): Payer: Medicare HMO

## 2020-07-20 ENCOUNTER — Other Ambulatory Visit: Payer: Self-pay

## 2020-07-20 ENCOUNTER — Encounter (INDEPENDENT_AMBULATORY_CARE_PROVIDER_SITE_OTHER): Payer: Self-pay | Admitting: Vascular Surgery

## 2020-07-20 ENCOUNTER — Ambulatory Visit (INDEPENDENT_AMBULATORY_CARE_PROVIDER_SITE_OTHER): Payer: Medicare HMO | Admitting: Vascular Surgery

## 2020-07-20 VITALS — BP 139/80 | HR 85 | Resp 16 | Wt 245.0 lb

## 2020-07-20 DIAGNOSIS — E785 Hyperlipidemia, unspecified: Secondary | ICD-10-CM

## 2020-07-20 DIAGNOSIS — E1142 Type 2 diabetes mellitus with diabetic polyneuropathy: Secondary | ICD-10-CM

## 2020-07-20 DIAGNOSIS — I10 Essential (primary) hypertension: Secondary | ICD-10-CM

## 2020-07-20 DIAGNOSIS — I739 Peripheral vascular disease, unspecified: Secondary | ICD-10-CM

## 2020-07-20 DIAGNOSIS — I7025 Atherosclerosis of native arteries of other extremities with ulceration: Secondary | ICD-10-CM | POA: Diagnosis not present

## 2020-07-20 DIAGNOSIS — Z794 Long term (current) use of insulin: Secondary | ICD-10-CM

## 2020-07-20 DIAGNOSIS — L97519 Non-pressure chronic ulcer of other part of right foot with unspecified severity: Secondary | ICD-10-CM | POA: Diagnosis not present

## 2020-09-04 DIAGNOSIS — Z860100 Personal history of colon polyps, unspecified: Secondary | ICD-10-CM | POA: Insufficient documentation

## 2020-09-04 DIAGNOSIS — K5900 Constipation, unspecified: Secondary | ICD-10-CM | POA: Insufficient documentation

## 2020-09-04 DIAGNOSIS — E669 Obesity, unspecified: Secondary | ICD-10-CM | POA: Insufficient documentation

## 2020-09-04 DIAGNOSIS — K219 Gastro-esophageal reflux disease without esophagitis: Secondary | ICD-10-CM | POA: Insufficient documentation

## 2020-09-04 DIAGNOSIS — N400 Enlarged prostate without lower urinary tract symptoms: Secondary | ICD-10-CM | POA: Insufficient documentation

## 2020-12-11 ENCOUNTER — Encounter: Payer: Self-pay | Admitting: *Deleted

## 2021-01-17 ENCOUNTER — Telehealth: Payer: Self-pay

## 2021-01-17 ENCOUNTER — Telehealth: Payer: Medicare HMO

## 2021-01-17 NOTE — Telephone Encounter (Signed)
Attempted to call patient 3x's and the number is invalid/disconnected. Unable to leave message.

## 2021-06-15 NOTE — Progress Notes (Signed)
Sleep Medicine   Office Visit  Patient Name: [name] DOB: [birth year] MRN [id number]    Chief Complaint: osa on cpap  Brief History:  [name] presents with a 20 history of apnea and states he's been that way since early childhood..  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  This is snoring is extemely loud all positions and worse in the supine position.   Sleep quality is poor. This is noted all nights. The patient's bed partner reports observed apneas at night. The patient goes to sleep at 930pm and wakes up at 530am.  he reports that his sleep quality is poor with slight drowsy driving.  Sleep quality is poor when outside home environment.  Patient has noted no restlessness of his legs at night.  The patient  relates no unusual behavior during the night.  The patient denies a history of psychiatric problems. The Epworth Sleepiness Score is 7 out of 24 .  The patient relates  Cardiovascular risk factors include: hypertension.      ROS  General: (-) fever, (-) chills, (-) night sweat Nose and Sinuses: (-) nasal stuffiness or itchiness, (-) postnasal drip, (-) nosebleeds, (-) sinus trouble. Mouth and Throat: (-) sore throat, (-) hoarseness. Neck: (-) swollen glands, (-) enlarged thyroid, (-) neck pain. Respiratory: - cough, - shortness of breath, - wheezing. Neurologic: + numbness, + tingling. Psychiatric: - anxiety, - depression Sleep behavior: -sleep paralysis -hypnogogic hallucinations -dream enactment      -vivid dreams -cataplexy -night terrors -sleep walking   Current Medication: Outpatient Encounter Medications as of 06/18/2021  Medication Sig   amLODipine (NORVASC) 10 MG tablet Take by mouth.   insulin glargine (LANTUS SOLOSTAR) 100 UNIT/ML Solostar Pen Inject into the skin.   metFORMIN (GLUCOPHAGE) 1000 MG tablet Take by mouth.   simvastatin (ZOCOR) 20 MG tablet Take  by mouth.   acetaminophen (TYLENOL) 325 MG tablet Take 2 tablets (650 mg total) by mouth every 6 (six) hours as needed for mild pain (or Fever >/= 101).   amLODipine (NORVASC) 10 MG tablet Take 10 mg by mouth daily.   aspirin 81 MG EC tablet Take by mouth.   aspirin EC 81 MG tablet Take 81 mg by mouth daily.   Calcium Carbonate-Vit D-Min (GNP CALCIUM 1200) 1200-1000 MG-UNIT CHEW Chew 1,200 mg by mouth daily with breakfast. Take in combination with vitamin D and magnesium. (Patient not taking: Reported on 07/26/2019)   chlorthalidone (HYGROTON) 25 MG tablet Take 25 mg by mouth daily.   hydrochlorothiazide (HYDRODIURIL) 25 MG tablet Take 25 mg by mouth daily. (Patient not taking: Reported on 07/20/2020)   JARDIANCE 10 MG TABS tablet Take 10 mg by mouth daily.   LANTUS SOLOSTAR 100 UNIT/ML Solostar Pen Take 35 unit subcutaneously every night   losartan (COZAAR) 100 MG tablet Take 100 mg by mouth daily.    losartan (COZAAR) 100 MG tablet Take by mouth.   metFORMIN (GLUCOPHAGE) 1000 MG tablet TAKE 1 TABLET TWICE DAILY FOR DIABETES   Multiple Vitamin (MULTIVITAMIN) capsule Take 1 capsule by mouth daily.   simvastatin (ZOCOR) 20 MG tablet Take 20 mg by mouth daily.   TRUEplus Lancets 33G MISC    [DISCONTINUED] gabapentin (NEURONTIN) 100 MG capsule TAKE 1 CAPSULE BY MOUTH EVERY DAY AT BEDTIME   [DISCONTINUED] tamsulosin (FLOMAX) 0.4 MG CAPS capsule    Facility-Administered Encounter Medications as of 06/18/2021  Medication   Tdap (BOOSTRIX) injection 0.5 mL    Surgical History: Past Surgical History:  Procedure Laterality Date   AMPUTATION TOE Left    small toe   AMPUTATION TOE Right 04/02/2019   Procedure: AMPUTATION TOE 2ND;  Surgeon: Samara Deist, DPM;  Location: [facility];  Service: Podiatry;  Laterality: Right;   COLONOSCOPY W/ BIOPSIES AND POLYPECTOMY     I & D EXTREMITY Left 01/11/2016   Procedure: LEFT FOOT IRRIGATION AND DEBRIDEMENT WOUND VAC AND REMOVAL OF HARDWARE;  Surgeon: Wylene Simmer, MD;  Location: [place];  Service: Orthopedics;  Laterality: Left;   LOWER EXTREMITY ANGIOGRAPHY N/A 12/01/2017   Procedure: Lower Extremity Angiography;  Surgeon: Algernon Huxley, MD;  Location: [place] [facility];  Service: Cardiovascular;  Laterality: N/A;   LOWER EXTREMITY ANGIOGRAPHY Right 12/04/2017   Procedure: Lower Extremity Angiography;  Surgeon: Algernon Huxley, MD;  Location: [place] [facility];  Service: Cardiovascular;  Laterality: Right;   LOWER EXTREMITY ANGIOGRAPHY Left 03/12/2018   Procedure: LOWER EXTREMITY ANGIOGRAPHY;  Surgeon: Algernon Huxley, MD;  Location: [place] [facility];  Service: Cardiovascular;  Laterality: Left;   ORIF TOE FRACTURE Left 06/08/2015   Procedure: OPEN REDUCTION INTERNAL FIXATION (ORIF) LEFT FIFTH METATARSAL BASE FRACTURE NONUNION; CALCANEAL AUTOGRAFT ;  Surgeon: Wylene Simmer, MD;  Location: [facility];  Service: Orthopedics;  Laterality: Left;   PATELLA RECONSTRUCTION Left 2005   PROSTATE ABLATION  2014   TOE AMPUTATION     partial amputation right great toe   WOUND DEBRIDEMENT Bilateral 11/30/2017   Procedure: DEBRIDEMENT WOUND;  Surgeon: Sharlotte Alamo, DPM;  Location: [facility];  Service: Podiatry;  Laterality: Bilateral;    Medical History: Past Medical History:  Diagnosis Date   Arthritis    Bacterial infection due to Morganella morganii 12/26/2017   BPH (benign prostatic hypertrophy)    Diabetes mellitus    Diabetic neuropathy (HCC)    Hypertension    Metatarsal bone fracture    left 5th toe   Osteomyelitis of toe of right foot (HCC)    Peripheral vascular disease (HCC)    Post-operative infection    and diabetic ulcer left foot   Sepsis due to Streptococcus, group B ([place]) 12/26/2017   Sleep apnea     does wear CPAP   Wears glasses     Family History: Non contributory to the present illness  Social History: Social History   Socioeconomic History   Marital status: Married    Spouse name: [name]   Number of children: 2    Years of education: Not on file   Highest education level: Not on file  Occupational History   Not on file  Tobacco Use   Smoking status: Never   Smokeless tobacco: Never  Vaping Use   Vaping Use: Never used  Substance and Sexual Activity   Alcohol use: Yes    Comment: occasional    Drug use: Yes    Types: Marijuana, Cocaine    Comment: last month   Sexual activity: Not on file  Other Topics Concern   Not on file  Social History Narrative   Lives with wife. Children all grown up.   Social Determinants of Health   Financial Resource Strain: Not on file  Food Insecurity: Not on file  Transportation Needs: Not on file  Physical Activity: Not on file  Stress: Not on file  Social Connections: Not on file  Intimate Partner Violence: Not on file  Vital Signs: Blood pressure (!) 150/62, pulse 100, resp. rate 18, height 5\' 10"  (1.778 m), weight 256 lb 3.2 oz (116.2 kg), SpO2 94 %. Body mass index is 36.76 kg/m.   Examination: General Appearance: The patient is well-developed, well-nourished, and in no distress. Neck Circumference: 45.5 cm Skin: Gross inspection of skin unremarkable. Head: normocephalic, no gross deformities. Eyes: no gross deformities noted. ENT: ears appear grossly normal Neurologic: Alert and oriented. No involuntary movements.    EPWORTH SLEEPINESS SCALE:  Scale:  (0)= no chance of dozing; (1)= slight chance of dozing; (2)= moderate chance of dozing; (3)= high chance of dozing  Chance  Situtation    Sitting and reading: 2    Watching TV: 2    Sitting Inactive in public: 0    As a passenger in car: 2      Lying down to rest: 1    Sitting and talking: 0    Sitting quielty after lunch: 0    In a car, stopped in traffic: 0    TOTAL SCORE:   7 out of 24    SLEEP STUDIES:  No studies on file   LABS: No results found for this or any previous visit (from the past 2160 hour(s)).  Radiology: DG Knee 1-2 Views Right  Result  Date: 07/06/2020 CLINICAL DATA:  Knot noted involving the medial aspect the right knee for the past week. EXAM: RIGHT KNEE - 1-2 VIEW COMPARISON:  None. FINDINGS: No fracture or dislocation. Mild tricompartmental degenerative change of the knee with joint space loss, subchondral sclerosis and osteophytosis. Minimal enthesopathic change involving the superior and inferior poles of patella. No joint effusion. Scattered adjacent vascular calcifications. No radiopaque foreign body. IMPRESSION: 1. No definitive radiographic correlate for reported palpable knot involving the medial aspect of the right knee. 2. Mild tricompartmental degenerative change of the knee. 3. Scattered vascular calcifications. Electronically Signed   By: Sandi Mariscal M.D.   On: 07/06/2020 14:59    No results found.  No results found.    Assessment and Plan: Patient Active Problem List   Diagnosis Date Noted   OSA (obstructive sleep apnea) 06/18/2021   Gastroesophageal reflux disease 09/04/2020   BPH (benign prostatic hyperplasia) 09/04/2020   Chronic anticoagulationn (PLAVIX) 03/15/2018   Diabetes mellitus due to underlying condition, uncontrolled, with diabetic neuropathy 03/10/2018   Type 2 diabetes mellitus with diabetic polyneuropathy, with long-term current use of insulin ([place]) 03/10/2018   Diabetic polyneuropathy associated with type 2 diabetes mellitus ([place]) 03/10/2018   Atherosclerosis of native arteries of the extremities with ulceration ([place]) 02/24/2018   Sleep apnea 02/24/2018   Diabetic peripheral neuropathy ([place]) 02/24/2018   Vitamin D insufficiency 02/24/2018   Neurogenic pain 02/24/2018   Chronic foot pain (Primary Area of Pain) (Bilateral) (L>R) 02/10/2018   Chronic lower extremity pain (Secondary Area of Pain) (Bilateral) (L>R) 02/10/2018   Chronic pain syndrome 02/10/2018   Long term current use of opiate analgesic 02/10/2018   Pharmacologic therapy 02/10/2018   Disorder of skeletal system 02/10/2018    Problems influencing health status 02/10/2018   Bacterial infection due to Morganella morganii 12/26/2017   Sepsis due to Streptococcus, group B ([place]) 12/26/2017   HTN (hypertension) 11/29/2017   HLD (hyperlipidemia) 11/29/2017   Osteomyelitis ([place]) 11/29/2017   Cellulitis of foot (Left) 03/05/2017   Surgery, elective    Hardware complicating wound infection ([place])    Metatarsal stress fracture of left foot 01/12/2016   Diabetic foot infection ([place]) 01/12/2016   Diabetic  ulcer of foot associated with type 2 diabetes mellitus, with necrosis of muscle ([place]) 01/11/2016   1. OSA (obstructive sleep apnea) PLAN OSA:   Patient evaluation suggests high risk of sleep disordered breathing due to history of OSA.  Patient has comorbid cardiovascular risk factors including: hypertension which could be exacerbated by pathologic sleep-disordered breathing.  Suggest: PSG to assess the patient's sleep disordered breathing. The patient was also counselled on weight loss to optimize sleep health.   2. CPAP use counseling CPAP Counseling: had a lengthy discussion with the patient regarding the importance of PAP therapy in management of the sleep apnea. Patient appears to understand the risk factor reduction and also understands the risks associated with untreated sleep apnea. Patient will try to make a good faith effort to remain compliant with therapy. Also instructed the patient on proper cleaning of the device including the water must be changed daily if possible and use of distilled water is preferred. Patient understands that the machine should be regularly cleaned with appropriate recommended cleaning solutions that do not damage the PAP machine for example given white vinegar and water rinses. Other methods such as ozone treatment may not be as good as these simple methods to achieve cleaning.       General Counseling: I have discussed the findings of the evaluation and examination with Joneen Boers.  I have  also discussed any further diagnostic evaluation thatmay be needed or ordered today. Serigne verbalizes understanding of the findings of todays visit. We also reviewed his medications today and discussed drug interactions and side effects including but not limited excessive drowsiness and altered mental states. We also discussed that there is always a risk not just to him but also people around him. he has been encouraged to call the office with any questions or concerns that should arise related to todays visit.  No orders of the defined types were placed in this encounter.       I have personally obtained a history, evaluated the patient, evaluated pertinent data, formulated the assessment and plan and placed orders.   This patient was seen today by Tressie Ellis, PA-C in collaboration with Dr. Devona Konig.   Allyne Gee, MD [facility] Diplomate ABMS Pulmonary and Critical Care Medicine Sleep medicine

## 2021-06-18 ENCOUNTER — Ambulatory Visit (INDEPENDENT_AMBULATORY_CARE_PROVIDER_SITE_OTHER): Payer: 59 | Admitting: Internal Medicine

## 2021-06-18 VITALS — BP 150/62 | HR 100 | Resp 18 | Ht 70.0 in | Wt 256.2 lb

## 2021-06-18 DIAGNOSIS — G4733 Obstructive sleep apnea (adult) (pediatric): Secondary | ICD-10-CM

## 2021-06-18 DIAGNOSIS — Z7189 Other specified counseling: Secondary | ICD-10-CM

## 2021-06-18 NOTE — Patient Instructions (Signed)
CPAP and BIPAP Information ?CPAP and BIPAP are methods that use air pressure to keep your airways open and to help you breathe well. CPAP and BIPAP use different amounts of pressure. Your health care provider will tell you whether CPAP or BIPAP would be more helpful for you. ?CPAP stands for "continuous positive airway pressure." With CPAP, the amount of pressure stays the same while you breathe in (inhale) and out (exhale). ?BIPAP stands for "bi-level positive airway pressure." With BIPAP, the amount of pressure will be higher when you inhale and lower when you exhale. This allows you to take larger breaths. ?CPAP or BIPAP may be used in the hospital, or your health care provider may want you to use it at home. You may need to have a sleep study before your health care provider can order a machine for you to use at home. ?What are the advantages? ?CPAP or BIPAP can be helpful if you have: ?Sleep apnea. ?Chronic obstructive pulmonary disease (COPD). ?Heart failure. ?Medical conditions that cause muscle weakness, including muscular dystrophy or amyotrophic lateral sclerosis (ALS). ?Other problems that cause breathing to be shallow, weak, abnormal, or difficult. ?CPAP and BIPAP are most commonly used for obstructive sleep apnea (OSA) to keep the airways from collapsing when the muscles relax during sleep. ?What are the risks? ?Generally, this is a safe treatment. However, problems may occur, including: ?Irritated skin or skin sores if the mask does not fit properly. ?Dry or stuffy nose or nosebleeds. ?Dry mouth. ?Feeling gassy or bloated. ?Sinus or lung infection if the equipment is not cleaned properly. ?When should CPAP or BIPAP be used? ?In most cases, the mask only needs to be worn during sleep. Generally, the mask needs to be worn throughout the night and during any daytime naps. People with certain medical conditions may also need to wear the mask at other times, such as when they are awake. Follow instructions  from your health care provider about when to use the machine. ?What happens during CPAP or BIPAP? ?Both CPAP and BIPAP are provided by a small machine with a flexible plastic tube that attaches to a plastic mask that you wear. Air is blown through the mask into your nose or mouth. The amount of pressure that is used to blow the air can be adjusted on the machine. Your health care provider will set the pressure setting and help you find the best mask for you. ?Tips for using the mask ?Because the mask needs to be snug, some people feel trapped or closed-in (claustrophobic) when first using the mask. If you feel this way, you may need to get used to the mask. One way to do this is to hold the mask loosely over your nose or mouth and then gradually apply the mask more snugly. You can also gradually increase the amount of time that you use the mask. ?Masks are available in various types and sizes. If your mask does not fit well, talk with your health care provider about getting a different one. Some common types of masks include: ?Full face masks, which fit over the mouth and nose. ?Nasal masks, which fit over the nose. ?Nasal pillow or prong masks, which fit into the nostrils. ?If you are using a mask that fits over your nose and you tend to breathe through your mouth, a chin strap may be applied to help keep your mouth closed. ?Use a skin barrier to protect your skin as told by your health care provider. ?Some CPAP and   BIPAP machines have alarms that may sound if the mask comes off or develops a leak. ?If you have trouble with the mask, it is very important that you talk with your health care provider about finding a way to make the mask easier to tolerate. Do not stop using the mask. There could be a negative impact on your health if you stop using the mask. ?Tips for using the machine ?Place your CPAP or BIPAP machine on a secure table or stand near an electrical outlet. ?Know where the on/off switch is on the  machine. ?Follow instructions from your health care provider about how to set the pressure on your machine and when you should use it. ?Do not eat or drink while the CPAP or BIPAP machine is on. Food or fluids could get pushed into your lungs by the pressure of the CPAP or BIPAP. ?For home use, CPAP and BIPAP machines can be rented or purchased through home health care companies. Many different brands of machines are available. Renting a machine before purchasing may help you find out which particular machine works well for you. Your health insurance company may also decide which machine you may get. ?Keep the CPAP or BIPAP machine and attachments clean. Ask your health care provider for specific instructions. ?Check the humidifier if you have a dry stuffy nose or nosebleeds. Make sure it is working correctly. ?Follow these instructions at home: ?Take over-the-counter and prescription medicines only as told by your health care provider. Ask if you can take sinus medicine if your sinuses are blocked. ?Do not use any products that contain nicotine or tobacco. These products include cigarettes, chewing tobacco, and vaping devices, such as e-cigarettes. If you need help quitting, ask your health care provider. ?Keep all follow-up visits. This is important. ?Contact a health care provider if: ?You have redness or pressure sores on your head, face, mouth, or nose from the mask or head gear. ?You have trouble using the CPAP or BIPAP machine. ?You cannot tolerate wearing the CPAP or BIPAP mask. ?Someone tells you that you snore even when wearing your CPAP or BIPAP. ?Get help right away if: ?You have trouble breathing. ?You feel confused. ?Summary ?CPAP and BIPAP are methods that use air pressure to keep your airways open and to help you breathe well. ?If you have trouble with the mask, it is very important that you talk with your health care provider about finding a way to make the mask easier to tolerate. Do not stop using  the mask. There could be a negative impact to your health if you stop using the mask. ?Follow instructions from your health care provider about when to use the machine. ?This information is not intended to replace advice given to you by your health care provider. Make sure you discuss any questions you have with your health care provider. ?Document Revised: 01/24/2021 Document Reviewed: 05/26/2020 ?Elsevier Patient Education ? 2022 [employer]vier Inc[employer] ? ?

## 2021-07-18 NOTE — Progress Notes (Deleted)
MRN : [id number]  [name] is a 61 y.o. ([birth year]) male who presents with chief complaint of check circulation.  History of Present Illness:   The patient returns to the office for followup and review of the noninvasive studies. There have been no interval changes in lower extremity symptoms. No interval shortening of the patient's claudication distance or development of rest pain symptoms. No new ulcers or wounds have occurred since the last visit.  There have been no significant changes to the patient's overall health care.  The patient denies amaurosis fugax or recent TIA symptoms. There are no recent neurological changes noted. The patient denies history of DVT, PE or superficial thrombophlebitis. The patient denies recent episodes of angina or shortness of breath.   ABI Rt=*** and Lt=***  (previous ABI's Rt=*** and Lt=***) Duplex ultrasound of the ***   No outpatient medications have been marked as taking for the 07/19/21 encounter (Appointment) with Delana Meyer, [name] Lory, MD.    Past Medical History:  Diagnosis Date   Arthritis    Bacterial infection due to Morganella morganii 12/26/2017   BPH (benign prostatic hypertrophy)    Diabetes mellitus    Diabetic neuropathy ([place])    Hypertension    Metatarsal bone fracture    left 5th toe   Osteomyelitis of toe of right foot ([place])    Peripheral vascular disease ([place])    Post-operative infection    and diabetic ulcer left foot   Sepsis due to Streptococcus, group B ([place]) 12/26/2017   Sleep apnea     does wear CPAP   Wears glasses     Past Surgical History:  Procedure Laterality Date   AMPUTATION TOE Left    small toe   AMPUTATION TOE Right 04/02/2019   Procedure: AMPUTATION TOE 2ND;  Surgeon: Samara Deist, DPM;  Location: [facility];  Service: Podiatry;  Laterality: Right;   COLONOSCOPY W/ BIOPSIES AND POLYPECTOMY     I & D EXTREMITY Left 01/11/2016   Procedure: LEFT FOOT IRRIGATION AND DEBRIDEMENT WOUND VAC AND  REMOVAL OF HARDWARE;  Surgeon: Wylene Simmer, MD;  Location: [place];  Service: Orthopedics;  Laterality: Left;   LOWER EXTREMITY ANGIOGRAPHY N/A 12/01/2017   Procedure: Lower Extremity Angiography;  Surgeon: Algernon Huxley, MD;  Location: [place] [facility];  Service: Cardiovascular;  Laterality: N/A;   LOWER EXTREMITY ANGIOGRAPHY Right 12/04/2017   Procedure: Lower Extremity Angiography;  Surgeon: Algernon Huxley, MD;  Location: [facility] [facility];  Service: Cardiovascular;  Laterality: Right;   LOWER EXTREMITY ANGIOGRAPHY Left 03/12/2018   Procedure: LOWER EXTREMITY ANGIOGRAPHY;  Surgeon: Algernon Huxley, MD;  Location: [place] [facility];  Service: Cardiovascular;  Laterality: Left;   ORIF TOE FRACTURE Left 06/08/2015   Procedure: OPEN REDUCTION INTERNAL FIXATION (ORIF) LEFT FIFTH METATARSAL BASE FRACTURE NONUNION; CALCANEAL AUTOGRAFT ;  Surgeon: Wylene Simmer, MD;  Location: [place];  Service: Orthopedics;  Laterality: Left;   PATELLA RECONSTRUCTION Left 2005   PROSTATE ABLATION  2014   TOE AMPUTATION     partial amputation right great toe   WOUND DEBRIDEMENT Bilateral 11/30/2017   Procedure: DEBRIDEMENT WOUND;  Surgeon: Sharlotte Alamo, DPM;  Location: [facility];  Service: Podiatry;  Laterality: Bilateral;    Social History Social History   Tobacco Use   Smoking status: Never   Smokeless tobacco: Never  Vaping Use   Vaping Use: Never used  Substance Use Topics   Alcohol use: Yes    Comment: occasional  Drug use: Yes    Types: Marijuana, Cocaine    Comment: last month    Family History Family History  Problem Relation Age of Onset   Diabetes Mother    Diabetes Other     Allergies  Allergen Reactions   Sulfamethoxazole-Trimethoprim Hives and Rash     REVIEW OF SYSTEMS (Negative unless checked)  Constitutional: [] Weight loss  [] Fever  [] Chills Cardiac: [] Chest pain   [] Chest pressure   [] Palpitations   [] Shortness of breath when laying flat   [] Shortness of breath  with exertion. Vascular:  [x] Pain in legs with walking   [] Pain in legs at rest  [] History of DVT   [] Phlebitis   [] Swelling in legs   [] Varicose veins   [] Non-healing ulcers Pulmonary:   [] Uses home oxygen   [] Productive cough   [] Hemoptysis   [] Wheeze  [] COPD   [] Asthma Neurologic:  [] Dizziness   [] Seizures   [] History of stroke   [] History of TIA  [] Aphasia   [] Vissual changes   [] Weakness or numbness in arm   [] Weakness or numbness in leg Musculoskeletal:   [] Joint swelling   [] Joint pain   [] Low back pain Hematologic:  [] Easy bruising  [] Easy bleeding   [] Hypercoagulable state   [] Anemic Gastrointestinal:  [] Diarrhea   [] Vomiting  [] Gastroesophageal reflux/heartburn   [] Difficulty swallowing. Genitourinary:  [] Chronic kidney disease   [] Difficult urination  [] Frequent urination   [] Blood in urine Skin:  [] Rashes   [] Ulcers  Psychological:  [] History of anxiety   []  History of major depression.  Physical Examination  There were no vitals filed for this visit. There is no height or weight on file to calculate BMI. Gen: WD/WN, NAD Head: [place]/AT, No temporalis wasting.  Ear/Nose/Throat: Hearing grossly intact, nares w/o erythema or drainage Eyes: PER, EOMI, sclera nonicteric.  Neck: Supple, no masses.  No bruit or JVD.  Pulmonary:  Good air movement, no audible wheezing, no use of accessory muscles.  Cardiac: RRR, normal S1, S2, no Murmurs. Vascular:   Vessel Right Left  Radial Palpable Palpable  Carotid Palpable Palpable  PT Palpable Palpable  DP Palpable Palpable  Gastrointestinal: soft, non-distended. No guarding/no peritoneal signs.  Musculoskeletal: M/S 5/5 throughout.  No visible deformity.  Neurologic: CN 2-12 intact. Pain and light touch intact in extremities.  Symmetrical.  Speech is fluent. Motor exam as listed above. Psychiatric: Judgment intact, Mood & affect appropriate for pt's clinical situation. Dermatologic: No rashes or ulcers noted.  No changes consistent with  cellulitis.   CBC Lab Results  Component Value Date   WBC 9.3 06/25/2019   HGB 12.9 (L) 06/25/2019   HCT 38.0 (L) 06/25/2019   MCV 91.1 06/25/2019   PLT 259 06/25/2019    BMET    Component Value Date/Time   NA 135 06/25/2019 0453   NA 139 02/10/2018 1154   NA 134 (L) 03/26/2014 2050   K 4.0 06/25/2019 0453   K 4.3 03/26/2014 2050   CL 97 (L) 06/25/2019 0453   CL 103 03/26/2014 2050   CO2 26 06/25/2019 0453   CO2 29 03/26/2014 2050   GLUCOSE 274 (H) 06/25/2019 0453   GLUCOSE 150 (H) 03/26/2014 2050   BUN 18 06/25/2019 0453   BUN 13 02/10/2018 1154   BUN 18 03/26/2014 2050   CREATININE 0.98 06/25/2019 0453   CREATININE 0.86 03/26/2014 2050   CALCIUM 9.3 06/25/2019 0453   CALCIUM 8.7 03/26/2014 2050   GFRNONAA >60 06/25/2019 0453   GFRNONAA >60 03/26/2014 2050   GFRNONAA >  60 10/29/2012 1757   GFRAA >60 06/25/2019 0453   GFRAA >60 03/26/2014 2050   GFRAA >60 10/29/2012 1757   CrCl cannot be calculated (Patient's most recent lab result is older than the maximum 21 days allowed.).  COAG Lab Results  Component Value Date   INR 1.05 01/08/2018    Radiology No results found.   Assessment/Plan There are no diagnoses linked to this encounter.   Hortencia Pilar, MD  07/18/2021 10:37 AM

## 2021-07-19 ENCOUNTER — Ambulatory Visit (INDEPENDENT_AMBULATORY_CARE_PROVIDER_SITE_OTHER): Payer: Medicare HMO | Admitting: Vascular Surgery

## 2021-07-19 ENCOUNTER — Encounter (INDEPENDENT_AMBULATORY_CARE_PROVIDER_SITE_OTHER): Payer: Medicare HMO

## 2021-07-19 DIAGNOSIS — E785 Hyperlipidemia, unspecified: Secondary | ICD-10-CM

## 2021-07-19 DIAGNOSIS — I10 Essential (primary) hypertension: Secondary | ICD-10-CM

## 2021-07-19 DIAGNOSIS — E1142 Type 2 diabetes mellitus with diabetic polyneuropathy: Secondary | ICD-10-CM

## 2021-07-19 DIAGNOSIS — I7025 Atherosclerosis of native arteries of other extremities with ulceration: Secondary | ICD-10-CM

## 2021-08-03 ENCOUNTER — Encounter (INDEPENDENT_AMBULATORY_CARE_PROVIDER_SITE_OTHER): Payer: Medicare HMO | Admitting: Internal Medicine

## 2021-08-03 DIAGNOSIS — G4719 Other hypersomnia: Secondary | ICD-10-CM | POA: Diagnosis not present

## 2021-08-03 DIAGNOSIS — G4733 Obstructive sleep apnea (adult) (pediatric): Secondary | ICD-10-CM

## 2021-08-07 NOTE — Procedures (Signed)
[facility] Report Part I                                                                 Phone: [phone] Fax: [phone]  Patient Name: [name], [name] Acquisition Number: [id number]  Date of Birth: 12/01/1960 Acquisition Date: 08/03/2021  Referring Physician: Devona Konig, MD     History: The patient is a 61 year old male who was referred for evaluation of possible sleep apnea. Medical History: arthritis, diabetes type ii, diabetic neuropathy, hypertension, sleep apnea, BPH.  Medications: Norvasc, Lantus, Glucophage, Zocor, Tylenol, aspirin 81, calcium carbonate, hygroton, hydrodiuril, Jardiance, Cozaar.  Procedure: This routine overnight polysomnogram was performed on the [facility] using the standard diagnostic protocol. This included 6 channels of EEG, 2 channels of EOG, chin EMG, bilateral anterior tibialis EMG, nasal/oral thermistor, PTAF (nasal pressure transducer), chest and abdominal wall movements, EKG, and pulse oximetry.  Description: The total recording time was 362.5 minutes. The total sleep time was 208.0 minutes. There were a total of 140.2 minutes of wakefulness after sleep onset for a poorsleep efficiency of 57.4%. The latency to sleep onset was within normal limits at 14.3 minutes. The R sleep onset latency was prolonged at 170.0 minutes. Sleep parameters, as a percentage of the total sleep time, demonstrated 30.8% of sleep was in N1 sleep, 41.8% N2, 0.0% N3 and 27.4% R sleep. There were a total of 276 arousals for an arousal index of 79.6 arousals per hour of sleep that was elevated.  Respiratory monitoring demonstrated continuous moderate to severe degree of snoring in all positions. There were 277 apneas and hypopneas for an Apnea Hypopnea Index of 79.9 apneas and hypopneas per hour of sleep. The REM related apnea hypopnea index was 58.9/hr of REM sleep compared to a NREM AHI of 67.2/hr.  The average duration of the respiratory events was 32.8  seconds with a maximum duration of 83.5 seconds. The respiratory events occurred in all positions. The respiratory events were associated with peripheral oxygen desaturations on the average to 79%. The lowest oxygen desaturation associated with a respiratory event was 57%. Additionally, the baseline oxygen saturation during wakefulness was 78%, during NREM sleep averaged 91%, and during REM sleep averaged  92%. The total duration of oxygen < 90% was 118.6 minutes and <80% was 48.8 minutes.  Cardiac monitoring- did not demonstrate transient cardiac decelerations associated with the apneas. Intermittent PVCs were observed.   Periodic limb movement monitoring- did not demonstrate periodic limb movements.     Impression: This routine overnight polysomnogram demonstrated significant obstructive sleep apnea with an overall Apnea Hypopnea Index of 79.9 apneas and hypopneas per hour of sleep. The respiratory events were longer with more severe  oxygen desaturations during REM sleep  with the lowest desaturation to 57%.    There was a poor sleep efficiency with an elevated arousal index,increased awakenings and no slow wave sleep. These findings would appear to be due to the obstructive sleep apnea.  Recommendations:    An emergent  CPAP titration would be recommended due to the severity of the sleep apnea. Additionally, would recommend weight loss in a patient with a BMI of 29.3.     Allyne Gee, MD, [facility] Diplomate ABMS-Pulmonary, Critical Care and Sleep  Medicine  Electronically reviewed and digitally signed   [place] Report Part II  Phone: [phone] Fax: [phone]  Patient last name [name] Neck Size   19 in. Acquisition [phone]  Patient first name [name] Weight   256 lbs. Started 08/03/2021 at 11:00:48 PM  Birth date 05-25-1961 Height   70 in. Stopped 08/04/2021 at 5:08:42 AM  Age 34 BMI    36.7lb/in2 Duration 362.5  Study Type Adult      Report  generated by: Adriana Mccallum, RPSGT Reviewed by: Richelle Ito. Henke, PhD, ABSM, FAASM Sleep Data: Lights Out: 11:04:30 PM Sleep Onset: 11:18:48 PM  Lights On: 5:07:00 AM Sleep Efficiency: 57.4 %  Total Recording Time: 362.5 min Sleep Latency (from Lights Off) 14.3 min  Total Sleep Time (TST): 208.0 min R Latency (from Sleep Onset): 170.0 min  Sleep Period Time: 346.0 min Total number of awakenings: 114  Wake during sleep: 138.0 min Wake After Sleep Onset (WASO): 140.2 min   Sleep Data:         Arousal Summary: Stage  Latency from lights out (min) Latency from sleep onset (min) Duration (min) % Total Sleep Time  Normal values  N 1 14.3 0.0 64.0 30.8 (5%)  N 2 23.8 9.5 87.0 41.8 (50%)  N 3       0.0 0.0 (20%)  R 184.3 170.0 57.0 27.4 (25%)    Number Index  Spontaneous 34 9.8  Apneas & Hypopneas 242 69.8  RERAs 0 0.0       (Apneas & Hypopneas & RERAs)  (242) (69.8)  Limb Movement 0 0.0  Snore 0 0.0  TOTAL 276 79.6     Respiratory Data:  CA OA MA Apnea Hypopnea* A+ H RERA Total  Number 0 190 0 190 87 277 0 277  Mean Dur (sec) 0.0 32.7 0.0 32.7 33.0 32.8 0.0 32.8  Max Dur (sec) 0.0 73.5 0.0 73.5 83.5 83.5 0.0 83.5  Total Dur (min) 0.0 103.5 0.0 103.5 47.9 151.4 0.0 151.4  % of TST 0.0 49.8 0.0 49.8 23.0 72.8 0.0 72.8  Index (#/h TST) 0.0 54.8 0.0 54.8 25.1 79.9 0.0 79.9  *Hypopneas scored based on 4% or greater desaturation.  Sleep Stage:        REM NREM TST  AHI 58.9 67.2 79.9  RDI 58.9 67.2 79.9           Body Position Data:  Sleep (min) TST (%) REM (min) NREM (min) CA (#) OA (#) MA (#) HYP (#) AHI (#/h) RERA (#) RDI (#/h) Desat (#)  Supine 8.8 4.23 0.0 8.8 0 9 0 1 68.2 0 68.2 15  Non-Supine 199.20 95.77 57.00 142.20 0.00 181.00 0.00 86.00 80.42 0 80.42 294.00  Left: 181.5 87.26 57.0 124.5 0 159 0 83 80.0 0 80.0 264  Right: 17.7 8.51 0.0 17.7 0 22 0 3 84.7 0 84.7 30     Snoring: Total number of snoring episodes  0  Total time with snoring    min (   %  of sleep)   Oximetry Distribution:             WK REM NREM TOTAL  Average (%)   78 92 91 86  < 90% 57.8 14.1 46.7 118.6  < 80% 30.0 5.7 13.1 48.8  < 70% 24.0 1.3 2.7 28.0  # of Desaturations* 45 66 198 309  Desat Index (#/hour) 18.8 69.5 80.3 90.4  Desat Max (%) 38 40 31 40  Desat Max Dur (sec) 80.0 71.0 52.0 80.0  Approx Min O2 during sleep 62  Approx min O2 during a respiratory event 57  Was Oxygen added (Y/N) and final rate No:   0 LPM  *Desaturations based on 4% or greater drop from baseline.   Cheyne Stokes Breathing: None Present   Heart Rate Summary:  Average Heart Rate During Sleep 89.5 bpm      Highest Heart Rate During Sleep (95th %) 97.0 bpm      Highest Heart Rate During Sleep 139 bpm      Highest Heart Rate During Recording (TIB) 189 bpm (artifact)   Heart Rate Observations: Event Type # Events   Bradycardia 0 Lowest HR Scored: N/A  Sinus Tachycardia During Sleep 0 Highest HR Scored: N/A  Narrow Complex Tachycardia 0 Highest HR Scored: N/A  Wide Complex Tachycardia 0 Highest HR Scored: N/A  Asystole 0 Longest Pause: N/A  Atrial Fibrillation 0 Duration Longest Event: N/A  Other Arrythmias  No Type:    Periodic Limb Movement Data: (Primary legs unless otherwise noted) Total # Limb Movement 0 Limb Movement Index 0.0  Total # PLMS    PLMS Index     Total # PLMS Arousals    PLMS Arousal Index     Percentage Sleep Time with PLMS   min (   % sleep)  Mean Duration limb movements (secs)

## 2021-08-10 ENCOUNTER — Encounter (INDEPENDENT_AMBULATORY_CARE_PROVIDER_SITE_OTHER): Payer: 59 | Admitting: Internal Medicine

## 2021-08-10 DIAGNOSIS — G4733 Obstructive sleep apnea (adult) (pediatric): Secondary | ICD-10-CM

## 2021-08-13 NOTE — Procedures (Signed)
[facility] Report Part I  Phone: [phone] Fax: [phone]  Patient Name: Latron, Ribas Acquisition Number: [id number]  Date of Birth: 1960-09-20 Acquisition Date: 08/10/2021  Referring Physician: Devona Konig, MD     History: The patient is a 61 year old male with obstructive sleep apnea for CPAP titration. Medical History: OSA previous CPAP use, arthritis, hypertension, diabetes mellitus, neuropathy, BPH.  Medications: Norvasc, Lantus, Glucophage, Zocor, Tylenol, aspirin, calcium carbonate, Hygroton, hydrodiuril, Jardiance, Cozaar.  Procedure: This routine overnight polysomnogram was performed on the [facility] 5 using the standard CPAP protocol. This included 6 channels of EEG, 2 channels of EOG, chin EMG, bilateral anterior tibialis EMG, nasal/oral thermistor, PTAF (nasal pressure transducer), chest and abdominal wall movements, EKG, and pulse oximetry.  Description: The total recording time was 376.4 minutes. The total sleep time was 329.5 minutes. There were a total of 41.9 minutes of wakefulness after sleep onset for areducedsleep efficiency of 87.5%. The latency to sleep onset was short at 5.0 minutes. The R sleep onset latency was within normal limits at 75.0 minutes. Sleep parameters, as a percentage of the total sleep time, demonstrated 7.0% of sleep was in N1 sleep, 69.3% N2, 0.0% N3 and 23.7% R sleep. There were a total of 59 arousals for an arousal index of 10.7 arousals per hour of sleep that was normal.  Overall, there were a total of 52 respiratory events for a respiratory disturbance index, which includes apneas, hypopneas and RERAs (increased respiratory effort) of 9.5 respiratory events per hour of sleep during the pressure titration. CPAP was initiated at 5 cm H2O at lights out, 10:56 p.m. It was titrated in 1-2 cm increments for obstructive events to the final pressure of 14 cm H2O.  The apnea was controlled at the pressure with the exception  of some sleep/wake transitional events near the pressure change. Supine, REM sleep was observed at the final pressure.  Additionally, the baseline oxygen saturation during wakefulness was 95%, during NREM sleep averaged 95%, and during REM sleep averaged 97%. The total duration of oxygen < 90% was 14.6 minutes and <80% was 0.4 minutes.  Cardiac monitoring- a few PVCs were observed.   Periodic limb movement monitoring- demonstrated that there were 284 periodic limb movements for a periodic limb movement index of 51.7 periodic limb movements per hour of sleep.    Impression: This patient's obstructive sleep apnea demonstrated significant improvement with the utilization of nasal CPAP at 14 cm H3O.   There was a significantly elevated periodic limb movement index of 51.7 periodic limb movements per hour of sleep. Sometimes these limb movements appear transiently with the initiation of CPAP.  Recommendations: Would recommend utilization of nasal CPAP at 14 cm H2O.      A ResMed Airfit P-10 nasal pillows, size medium, were used. Chin strap used during study-no . Humidifier used during study-heated .     Allyne Gee, MD, [facility] Diplomate ABMS-Pulmonary, Critical Care and Sleep Medicine  Electronically reviewed and digitally signed  [facility] CPAP/BIPAP Polysomnogram Report Part II Phone: [phone] Fax: [phone]  Patient last name Niswander Neck Size 19.0 in. Acquisition [phone]  Patient first name Nichalas Weight 256.0 lbs. Started 08/10/2021 at 10:42:17 PM  Birth date 1961/05/14 Height 70.0 in. Stopped 08/11/2021 at 5:16:23 AM  Age 61      Type Adult BMI 36.7 lb/in2 Duration 376.4  Jaclynn Major, RPSGT Reviewed by: Richelle Ito. Saunders Glance, PhD, ABSM, FAASM Sleep Data: Waunita Schooner  Out: 10:56:17 PM Sleep Onset: 11:01:17 PM  Lights On: 5:12:41 AM Sleep Efficiency: 87.5 %  Total Recording Time: 376.4 min Sleep Latency (from Lights Off) 5.0 min  Total Sleep Time (TST): 329.5 min R  Latency (from Sleep Onset): 75.0 min  Sleep Period Time: 370.5 min Total number of awakenings: 20  Wake during sleep: 41.0 min Wake After Sleep Onset (WASO): 41.9 min   Sleep Data:         Arousal Summary: Stage  Latency from lights out (min) Latency from sleep onset (min) Duration (min) % Total Sleep Time  Normal values  N 1 5.0 0.0 23.0 7.0 (5%)  N 2 15.0 10.0 228.5 69.3 (50%)  N 3       0.0 0.0 (20%)  R 80.0 75.0 78.0 23.7 (25%)    Number Index  Spontaneous 28 5.1  Apneas & Hypopneas 19 3.5  RERAs 0 0.0       (Apneas & Hypopneas & RERAs)  (19) (3.5)  Limb Movement 12 2.2  Snore 0 0.0  TOTAL 59 10.7     Respiratory Data:  CA OA MA Apnea Hypopnea* A+ H RERA Total  Number 0 5 0 5 47 52 0 52  Mean Dur (sec) 0.0 26.8 0.0 26.8 36.5 35.5 0.0 35.5  Max Dur (sec) 0.0 36.5 0.0 36.5 66.5 66.5 0.0 66.5  Total Dur (min) 0.0 2.2 0.0 2.2 28.6 30.8 0.0 30.8  % of TST 0.0 0.7 0.0 0.7 8.7 9.4 0.0 9.4  Index (#/h TST) 0.0 0.9 0.0 0.9 8.6 9.5 0.0 9.5  *Hypopneas scored based on 4% or greater desaturation.  Sleep Stage:         REM NREM TST  AHI 3.1 11.5 9.5  RDI 3.1 11.5 9.5    Sleep (min) TST (%) REM (min) NREM (min) CA (#) OA (#) MA (#) HYP (#) AHI (#/h) RERA (#) RDI (#/h) Desat (#)  Supine 164.9 50.05 49.0 115.9 0 3 0 15 6.5 0 6.5 38  Non-Supine 164.60 49.95 29.00 135.60 0.00 2.00 0.00 32.00 12.39 0 12.39 59.00  Left: 150.1 45.55 29.0 121.1 0 2 0 32 13.6 0 13.6 58  Right: 14.5 4.40 0.0 14.5 0 0 0 0 0.0 0 0.00 1     Snoring: Total number of snoring episodes  0  Total time with snoring    min (   % of sleep)   Oximetry Distribution:             WK REM NREM TOTAL  Average (%)   95 97 95 95  < 90% 2.8 1.2 10.6 14.6  < 80% 0.3 0.1 0.0 0.4  < 70% 0.0 0.0 0.0 0.0  # of Desaturations* 7 5 85 97  Desat Index (#/hour) 11.6 3.8 20.3 17.7  Desat Max (%) 24 16 20 24   Desat Max Dur (sec) 53.0 29.0 112.0 112.0  Approx Min O2 during sleep 78  Approx min O2 during a  respiratory event 78  Was Oxygen added (Y/N) and final rate No:   0 LPM  *Desaturations based on 3% or greater drop from baseline.   Cheyne Stokes Breathing: None Present    Heart Rate Summary:  Average Heart Rate During Sleep 81.5 bpm      Highest Heart Rate During Sleep (95th %) 88.0 bpm      Highest Heart Rate During Sleep 150 bpm (artifact)  Highest Heart Rate During Recording (TIB) 150 bpm (artifact)   Heart Rate Observations: Event  Type # Events   Bradycardia 0 Lowest HR Scored: N/A  Sinus Tachycardia During Sleep 0 Highest HR Scored: N/A  Narrow Complex Tachycardia 0 Highest HR Scored: N/A  Wide Complex Tachycardia 0 Highest HR Scored: N/A  Asystole 0 Longest Pause: N/A  Atrial Fibrillation 0 Duration Longest Event: N/A  Other Arrythmias  No Type:   Periodic Limb Movement Data: (Primary legs unless otherwise noted) Total # Limb Movement 284 Limb Movement Index 51.7  Total # PLMS 284 PLMS Index 51.7  Total # PLMS Arousals 12 PLMS Arousal Index 2.2  Percentage Sleep Time with PLMS 105.57min (31.9 % sleep)  Mean Duration limb movements (secs) 1578.8    IPAP Level (cmH2O) EPAP Level (cmH2O) Total Duration (min) Sleep Duration (min) Sleep (%) REM (%) CA  #) OA # MA # HYP #) AHI (#/hr) RERAs # RERAs (#/hr) RDI (#/hr)  5 5 41.6 27.1 65.1 0.0 0 0 0 16 35.4 0 0.0 35.4  7 7  17.9 17.9 100.0 0.0 0 0 0 14 46.9 0 0.0 46.9  9 9  18.5 18.5 100.0 20.0 0 2 0 1 9.7 0 0.0 9.7  10 10  74.6 74.6 100.0 33.5 0 0 0 0 0.0 0 0.0 0.0  12 12 54.8 54.8 100.0 9.5 0 0 0 2 2.2 0 0.0 2.2  14 14  150.8 134.9 89.5 28.8 0 3 0 14 7.6 0 0.0 7.6

## 2021-09-13 ENCOUNTER — Telehealth: Payer: Self-pay

## 2021-09-13 NOTE — Telephone Encounter (Signed)
CALLED PATIENT NO ANSWER LEFT VOICEMAIL FOR A CALL BACK ? ?

## 2021-09-14 ENCOUNTER — Telehealth: Payer: Self-pay

## 2021-09-14 NOTE — Telephone Encounter (Signed)
CALLED PATIENT NO ANSWER LEFT VOICEMAIL FOR A CALL BACK °Letter sent °

## 2021-10-18 NOTE — Telephone Encounter (Signed)
Pt calls and advises he was referred for colonoscopy in march, he missed your call, pt is calling back-he is ready to schedule ?

## 2021-12-12 ENCOUNTER — Emergency Department: Payer: 59

## 2021-12-12 ENCOUNTER — Other Ambulatory Visit: Payer: Self-pay

## 2021-12-12 ENCOUNTER — Inpatient Hospital Stay: Payer: 59

## 2021-12-12 ENCOUNTER — Encounter: Payer: Self-pay | Admitting: Internal Medicine

## 2021-12-12 ENCOUNTER — Inpatient Hospital Stay
Admission: EM | Admit: 2021-12-12 | Discharge: 2021-12-18 | DRG: 629 | Disposition: A | Discharge status: Home Health | Payer: 59 | Attending: Internal Medicine | Admitting: Internal Medicine

## 2021-12-12 DIAGNOSIS — Z8619 Personal history of other infectious and parasitic diseases: Secondary | ICD-10-CM

## 2021-12-12 DIAGNOSIS — M86672 Other chronic osteomyelitis, left ankle and foot: Secondary | ICD-10-CM | POA: Diagnosis present

## 2021-12-12 DIAGNOSIS — M899 Disorder of bone, unspecified: Secondary | ICD-10-CM | POA: Diagnosis present

## 2021-12-12 DIAGNOSIS — L03116 Cellulitis of left lower limb: Secondary | ICD-10-CM | POA: Diagnosis not present

## 2021-12-12 DIAGNOSIS — Z882 Allergy status to sulfonamides status: Secondary | ICD-10-CM

## 2021-12-12 DIAGNOSIS — E1169 Type 2 diabetes mellitus with other specified complication: Secondary | ICD-10-CM | POA: Diagnosis present

## 2021-12-12 DIAGNOSIS — E11621 Type 2 diabetes mellitus with foot ulcer: Secondary | ICD-10-CM | POA: Diagnosis present

## 2021-12-12 DIAGNOSIS — G4733 Obstructive sleep apnea (adult) (pediatric): Secondary | ICD-10-CM | POA: Diagnosis present

## 2021-12-12 DIAGNOSIS — E11628 Type 2 diabetes mellitus with other skin complications: Secondary | ICD-10-CM | POA: Diagnosis present

## 2021-12-12 DIAGNOSIS — I10 Essential (primary) hypertension: Secondary | ICD-10-CM | POA: Diagnosis present

## 2021-12-12 DIAGNOSIS — Z794 Long term (current) use of insulin: Secondary | ICD-10-CM | POA: Diagnosis not present

## 2021-12-12 DIAGNOSIS — N400 Enlarged prostate without lower urinary tract symptoms: Secondary | ICD-10-CM | POA: Diagnosis present

## 2021-12-12 DIAGNOSIS — E785 Hyperlipidemia, unspecified: Secondary | ICD-10-CM | POA: Diagnosis present

## 2021-12-12 DIAGNOSIS — M609 Myositis, unspecified: Secondary | ICD-10-CM | POA: Diagnosis present

## 2021-12-12 DIAGNOSIS — Z7984 Long term (current) use of oral hypoglycemic drugs: Secondary | ICD-10-CM

## 2021-12-12 DIAGNOSIS — Z79899 Other long term (current) drug therapy: Secondary | ICD-10-CM | POA: Diagnosis not present

## 2021-12-12 DIAGNOSIS — I70245 Atherosclerosis of native arteries of left leg with ulceration of other part of foot: Secondary | ICD-10-CM | POA: Diagnosis not present

## 2021-12-12 DIAGNOSIS — Z7982 Long term (current) use of aspirin: Secondary | ICD-10-CM | POA: Diagnosis not present

## 2021-12-12 DIAGNOSIS — Z833 Family history of diabetes mellitus: Secondary | ICD-10-CM

## 2021-12-12 DIAGNOSIS — Z6835 Body mass index (BMI) 35.0-35.9, adult: Secondary | ICD-10-CM

## 2021-12-12 DIAGNOSIS — L97429 Non-pressure chronic ulcer of left heel and midfoot with unspecified severity: Secondary | ICD-10-CM | POA: Diagnosis present

## 2021-12-12 DIAGNOSIS — M869 Osteomyelitis, unspecified: Secondary | ICD-10-CM

## 2021-12-12 DIAGNOSIS — E1151 Type 2 diabetes mellitus with diabetic peripheral angiopathy without gangrene: Secondary | ICD-10-CM | POA: Diagnosis present

## 2021-12-12 DIAGNOSIS — L089 Local infection of the skin and subcutaneous tissue, unspecified: Secondary | ICD-10-CM | POA: Diagnosis not present

## 2021-12-12 DIAGNOSIS — E1142 Type 2 diabetes mellitus with diabetic polyneuropathy: Secondary | ICD-10-CM | POA: Diagnosis present

## 2021-12-12 DIAGNOSIS — Z89421 Acquired absence of other right toe(s): Secondary | ICD-10-CM

## 2021-12-12 DIAGNOSIS — M1990 Unspecified osteoarthritis, unspecified site: Secondary | ICD-10-CM | POA: Diagnosis present

## 2021-12-12 DIAGNOSIS — E669 Obesity, unspecified: Secondary | ICD-10-CM | POA: Diagnosis present

## 2021-12-12 DIAGNOSIS — Z23 Encounter for immunization: Secondary | ICD-10-CM | POA: Diagnosis not present

## 2021-12-12 LAB — CBC WITH DIFFERENTIAL/PLATELET
Abs Immature Granulocytes: 0.04 10*3/uL (ref 0.00–0.07)
Basophils Absolute: 0 10*3/uL (ref 0.0–0.1)
Basophils Relative: 0 %
Eosinophils Absolute: 0.1 10*3/uL (ref 0.0–0.5)
Eosinophils Relative: 2 %
HCT: 43.3 % (ref 39.0–52.0)
Hemoglobin: 14.2 g/dL (ref 13.0–17.0)
Immature Granulocytes: 1 %
Lymphocytes Relative: 21 %
Lymphs Abs: 1.6 10*3/uL (ref 0.7–4.0)
MCH: 30.1 pg (ref 26.0–34.0)
MCHC: 32.8 g/dL (ref 30.0–36.0)
MCV: 91.7 fL (ref 80.0–100.0)
Monocytes Absolute: 0.7 10*3/uL (ref 0.1–1.0)
Monocytes Relative: 9 %
Neutro Abs: 5.1 10*3/uL (ref 1.7–7.7)
Neutrophils Relative %: 67 %
Platelets: 279 10*3/uL (ref 150–400)
RBC: 4.72 MIL/uL (ref 4.22–5.81)
RDW: 12.9 % (ref 11.5–15.5)
WBC: 7.5 10*3/uL (ref 4.0–10.5)
nRBC: 0 % (ref 0.0–0.2)

## 2021-12-12 LAB — COMPREHENSIVE METABOLIC PANEL
ALT: 20 U/L (ref 0–44)
AST: 20 U/L (ref 15–41)
Albumin: 3.9 g/dL (ref 3.5–5.0)
Alkaline Phosphatase: 88 U/L (ref 38–126)
Anion gap: 8 (ref 5–15)
BUN: 22 mg/dL — ABNORMAL HIGH (ref 6–20)
CO2: 26 mmol/L (ref 22–32)
Calcium: 9.1 mg/dL (ref 8.9–10.3)
Chloride: 100 mmol/L (ref 98–111)
Creatinine, Ser: 0.92 mg/dL (ref 0.61–1.24)
GFR, Estimated: >60 mL/min (ref >60)
Glucose, Bld: 193 mg/dL — ABNORMAL HIGH (ref 70–99)
Potassium: 3.7 mmol/L (ref 3.5–5.1)
Sodium: 134 mmol/L — ABNORMAL LOW (ref 135–145)
Total Bilirubin: 0.6 mg/dL (ref 0.3–1.2)
Total Protein: 8 g/dL (ref 6.5–8.1)

## 2021-12-12 LAB — URINALYSIS, ROUTINE W REFLEX MICROSCOPIC
Bacteria, UA: NONE SEEN
Bilirubin Urine: NEGATIVE
Glucose, UA: >=500 mg/dL — AB
Ketones, ur: NEGATIVE mg/dL
Leukocytes,Ua: NEGATIVE
Nitrite: NEGATIVE
Protein, ur: NEGATIVE mg/dL
Specific Gravity, Urine: 1.015 (ref 1.005–1.030)
pH: 6 (ref 5.0–8.0)

## 2021-12-12 LAB — SEDIMENTATION RATE: Sed Rate: 48 mm/hr — ABNORMAL HIGH (ref 0–20)

## 2021-12-12 LAB — GLUCOSE, CAPILLARY
Glucose-Capillary: 124 mg/dL — ABNORMAL HIGH (ref 70–99)
Glucose-Capillary: 134 mg/dL — ABNORMAL HIGH (ref 70–99)
Glucose-Capillary: 140 mg/dL — ABNORMAL HIGH (ref 70–99)

## 2021-12-12 LAB — LACTIC ACID, PLASMA
Lactic Acid, Venous: 1.8 mmol/L (ref 0.5–1.9)
Lactic Acid, Venous: 1.7 mmol/L (ref 0.5–1.9)

## 2021-12-12 LAB — PROTIME-INR
INR: 0.9 (ref 0.8–1.2)
Prothrombin Time: 12.4 seconds (ref 11.4–15.2)

## 2021-12-12 LAB — APTT: aPTT: 29 seconds (ref 24–36)

## 2021-12-12 LAB — C-REACTIVE PROTEIN: CRP: 2.6 mg/dL — ABNORMAL HIGH (ref <1.0)

## 2021-12-12 MED ORDER — SIMVASTATIN 20 MG PO TABS
20.0000 mg | ORAL_TABLET | Freq: Every day | ORAL | Status: DC
  Administered 2021-12-12 – 2021-12-16 (×5): 20 mg via ORAL
  Filled 2021-12-12 (×5): qty 1

## 2021-12-12 MED ORDER — OXYCODONE-ACETAMINOPHEN 5-325 MG PO TABS
1.0000 | ORAL_TABLET | ORAL | Status: DC | PRN
  Administered 2021-12-12 – 2021-12-18 (×6): 1 via ORAL
  Filled 2021-12-12 (×6): qty 1

## 2021-12-12 MED ORDER — ONDANSETRON HCL 4 MG/2ML IJ SOLN: 4.0000 mg | Freq: Three times a day (TID) | INTRAMUSCULAR | Status: DC | PRN

## 2021-12-12 MED ORDER — SODIUM CHLORIDE 0.9 % IV SOLN
2500.0000 mg | Freq: Once | INTRAVENOUS | Status: DC
Start: 2021-12-12 — End: 2021-12-12

## 2021-12-12 MED ORDER — VANCOMYCIN HCL IN DEXTROSE 1-5 GM/200ML-% IV SOLN
1000.0000 mg | Freq: Two times a day (BID) | INTRAVENOUS | Status: DC
  Administered 2021-12-13: 1000 mg via INTRAVENOUS
  Filled 2021-12-12 (×2): qty 200

## 2021-12-12 MED ORDER — ONDANSETRON HCL 4 MG/2ML IJ SOLN
4.0000 mg | Freq: Once | INTRAMUSCULAR | Status: AC
Start: 2021-12-12 — End: 2021-12-12
  Administered 2021-12-12: 4 mg via INTRAVENOUS
  Filled 2021-12-12: qty 2

## 2021-12-12 MED ORDER — PIPERACILLIN-TAZOBACTAM 3.375 G IVPB 30 MIN
3.3750 g | Freq: Once | INTRAVENOUS | Status: DC
  Administered 2021-12-12: 3.375 g via INTRAVENOUS
  Filled 2021-12-12: qty 50

## 2021-12-12 MED ORDER — AMLODIPINE BESYLATE 10 MG PO TABS
10.0000 mg | ORAL_TABLET | Freq: Every day | ORAL | Status: DC
  Administered 2021-12-14 – 2021-12-18 (×5): 10 mg via ORAL
  Filled 2021-12-12 (×6): qty 1

## 2021-12-12 MED ORDER — HEPARIN SODIUM (PORCINE) 5000 UNIT/ML IJ SOLN
5000.0000 [IU] | Freq: Three times a day (TID) | INTRAMUSCULAR | Status: DC
  Administered 2021-12-12 – 2021-12-18 (×14): 5000 [IU] via SUBCUTANEOUS
  Filled 2021-12-12 (×15): qty 1

## 2021-12-12 MED ORDER — VANCOMYCIN HCL 1500 MG/300ML IV SOLN
1500.0000 mg | Freq: Once | INTRAVENOUS | Status: AC
  Administered 2021-12-12: 1500 mg via INTRAVENOUS
  Filled 2021-12-12: qty 300

## 2021-12-12 MED ORDER — INSULIN GLARGINE-YFGN 100 UNIT/ML ~~LOC~~ SOLN
30.0000 [IU] | Freq: Every day | SUBCUTANEOUS | Status: DC
  Administered 2021-12-12 – 2021-12-14 (×3): 30 [IU] via SUBCUTANEOUS
  Filled 2021-12-12 (×3): qty 0.3

## 2021-12-12 MED ORDER — VANCOMYCIN HCL IN DEXTROSE 1-5 GM/200ML-% IV SOLN
1000.0000 mg | Freq: Once | INTRAVENOUS | Status: DC
Start: 2021-12-12 — End: 2021-12-12

## 2021-12-12 MED ORDER — VANCOMYCIN HCL 1500 MG/300ML IV SOLN
1500.0000 mg | Freq: Once | INTRAVENOUS | Status: DC
  Filled 2021-12-12: qty 300

## 2021-12-12 MED ORDER — VANCOMYCIN HCL IN DEXTROSE 1-5 GM/200ML-% IV SOLN
1000.0000 mg | Freq: Once | INTRAVENOUS | Status: AC
  Administered 2021-12-12: 1000 mg via INTRAVENOUS
  Filled 2021-12-12: qty 200

## 2021-12-12 MED ORDER — ASPIRIN 81 MG PO TBEC
81.0000 mg | DELAYED_RELEASE_TABLET | Freq: Every day | ORAL | Status: DC
  Administered 2021-12-14 – 2021-12-18 (×4): 81 mg via ORAL
  Filled 2021-12-12 (×6): qty 1

## 2021-12-12 MED ORDER — PNEUMOCOCCAL 20-VAL CONJ VACC 0.5 ML IM SUSY
0.5000 mL | PREFILLED_SYRINGE | INTRAMUSCULAR | Status: AC
  Administered 2021-12-13: 0.5 mL via INTRAMUSCULAR
  Filled 2021-12-12 (×2): qty 0.5

## 2021-12-12 MED ORDER — ACETAMINOPHEN 325 MG PO TABS: 650.0000 mg | ORAL_TABLET | Freq: Four times a day (QID) | ORAL | Status: DC | PRN

## 2021-12-12 MED ORDER — LOSARTAN POTASSIUM 50 MG PO TABS
100.0000 mg | ORAL_TABLET | Freq: Every day | ORAL | Status: DC
  Administered 2021-12-14 – 2021-12-18 (×5): 100 mg via ORAL
  Filled 2021-12-12 (×6): qty 2

## 2021-12-12 MED ORDER — INSULIN ASPART 100 UNIT/ML IJ SOLN
0.0000 [IU] | Freq: Every day | INTRAMUSCULAR | Status: DC
  Administered 2021-12-13 – 2021-12-16 (×3): 2 [IU] via SUBCUTANEOUS
  Filled 2021-12-12 (×3): qty 1

## 2021-12-12 MED ORDER — INSULIN ASPART 100 UNIT/ML IJ SOLN
0.0000 [IU] | Freq: Three times a day (TID) | INTRAMUSCULAR | Status: DC
  Administered 2021-12-12: 1 [IU] via SUBCUTANEOUS
  Administered 2021-12-13: 2 [IU] via SUBCUTANEOUS
  Administered 2021-12-13: 3 [IU] via SUBCUTANEOUS
  Administered 2021-12-14: 1 [IU] via SUBCUTANEOUS
  Administered 2021-12-14 – 2021-12-15 (×2): 2 [IU] via SUBCUTANEOUS
  Administered 2021-12-15: 3 [IU] via SUBCUTANEOUS
  Administered 2021-12-15: 2 [IU] via SUBCUTANEOUS
  Administered 2021-12-16 – 2021-12-17 (×4): 1 [IU] via SUBCUTANEOUS
  Administered 2021-12-18: 3 [IU] via SUBCUTANEOUS
  Administered 2021-12-18: 2 [IU] via SUBCUTANEOUS
  Filled 2021-12-12 (×15): qty 1

## 2021-12-12 MED ORDER — PIPERACILLIN-TAZOBACTAM 3.375 G IVPB
3.3750 g | Freq: Three times a day (TID) | INTRAVENOUS | Status: DC
  Administered 2021-12-12: 3.375 g via INTRAVENOUS
  Filled 2021-12-12: qty 50

## 2021-12-12 MED ORDER — HYDRALAZINE HCL 20 MG/ML IJ SOLN: 5.0000 mg | INTRAMUSCULAR | Status: DC | PRN

## 2021-12-12 MED ORDER — CHLORTHALIDONE 25 MG PO TABS
25.0000 mg | ORAL_TABLET | Freq: Every day | ORAL | Status: DC
  Administered 2021-12-15 – 2021-12-18 (×3): 25 mg via ORAL
  Filled 2021-12-12 (×6): qty 1

## 2021-12-12 MED ORDER — MORPHINE SULFATE (PF) 4 MG/ML IV SOLN
4.0000 mg | INTRAVENOUS | Status: DC | PRN
  Administered 2021-12-12 – 2021-12-14 (×2): 4 mg via INTRAVENOUS
  Filled 2021-12-12 (×2): qty 1

## 2021-12-12 NOTE — ED Triage Notes (Signed)
Pt here from his doctor's office with left foot pain. Foot is wrapped in gauze. Pt is unsure if the infection is down to the bone and is here for admission.

## 2021-12-12 NOTE — Consult Note (Signed)
Pending MRI DM foot wound; sent from podiatrist to ER for IV antibiotics and possible debridement. Will follow remotely; assume podiatry will follow inpatient.   [place], [place], [place]

## 2021-12-12 NOTE — H&P (Signed)
History and Physical    [name] SWF:[id number] DOB: Sep 03, 1960 DOA: 12/12/2021  Referring MD/NP/PA:   PCP: Theotis Burrow, MD   Patient coming from:  The patient is coming from home.  At baseline, pt is independent for most of ADL.        Chief Complaint: left foot ulcer and pain  HPI: [name] is a 61 y.o. male with medical history significant of hypertension, hyperlipidemia, diabetes mellitus, OSA on CPAP, right foot osteomyelitis, BPH, who presents with left foot ulcer and pain.  Patient states he has left foot plantar ulcer for almost 4 weeks.  He has been following up with Dr. Vickki Muff of podiatry every week.  He was treated with oral doxycycline without improvement.  Patient was seen by Dr. Vickki Muff again today and sent to ED for further evaluation and treatment. Patient states that he has pain in left foot, which is constant, sharp, moderate to severe, nonradiating.  Denies fever or chills.  Patient does not have chest pain, cough, shortness breath.  No nausea, vomiting, diarrhea or abdominal pain.  No symptoms of UTI.   Data Reviewed and ED Course: pt was found to have WBC 7.5, lactic acid 1.8, INR 0.9, negative urinalysis, GFR> 60, temperature normal, blood pressure 150/90, heart rate 99, RR 18, oxygen saturation 98% on room air.  Chest x-ray negative for infiltration.  Patient is admitted to [facility] bed as inpatient.   EKG: Not done in ED, will get one.      Review of Systems:   General: no fevers, chills, no body weight gain, fatigue HEENT: no blurry vision, hearing changes or sore throat Respiratory: no dyspnea, coughing, wheezing CV: no chest pain, no palpitations GI: no nausea, vomiting, abdominal pain, diarrhea, constipation GU: no dysuria, burning on urination, increased urinary frequency, hematuria  Ext: no leg edema Neuro: no unilateral weakness, numbness, or tingling, no vision change or hearing loss Skin: has left foot ulcer and pain MSK: No  muscle spasm, no deformity, no limitation of range of movement in spin Heme: No easy bruising.  Travel history: No recent long distant travel.   Allergy:  Allergies  Allergen Reactions   Sulfamethoxazole-Trimethoprim Hives and Rash    Past Medical History:  Diagnosis Date   Arthritis    Bacterial infection due to Morganella morganii 12/26/2017   BPH (benign prostatic hypertrophy)    Diabetes mellitus    Diabetic neuropathy (HCC)    Hypertension    Metatarsal bone fracture    left 5th toe   Osteomyelitis of toe of right foot ([place])    Peripheral vascular disease ([place])    Post-operative infection    and diabetic ulcer left foot   Sepsis due to Streptococcus, group B ([place]) 12/26/2017   Sleep apnea     does wear CPAP   Wears glasses     Past Surgical History:  Procedure Laterality Date   AMPUTATION TOE Left    small toe   AMPUTATION TOE Right 04/02/2019   Procedure: AMPUTATION TOE 2ND;  Surgeon: Samara Deist, DPM;  Location: [facility];  Service: Podiatry;  Laterality: Right;   COLONOSCOPY W/ BIOPSIES AND POLYPECTOMY     I & D EXTREMITY Left 01/11/2016   Procedure: LEFT FOOT IRRIGATION AND DEBRIDEMENT WOUND VAC AND REMOVAL OF HARDWARE;  Surgeon: Wylene Simmer, MD;  Location: [facility];  Service: Orthopedics;  Laterality: Left;   LOWER EXTREMITY ANGIOGRAPHY N/A 12/01/2017   Procedure: Lower Extremity Angiography;  Surgeon: Algernon Huxley,  MD;  Location: [facility];  Service: Cardiovascular;  Laterality: N/A;   LOWER EXTREMITY ANGIOGRAPHY Right 12/04/2017   Procedure: Lower Extremity Angiography;  Surgeon: Algernon Huxley, MD;  Location: [facility];  Service: Cardiovascular;  Laterality: Right;   LOWER EXTREMITY ANGIOGRAPHY Left 03/12/2018   Procedure: LOWER EXTREMITY ANGIOGRAPHY;  Surgeon: Algernon Huxley, MD;  Location: [facility];  Service: Cardiovascular;  Laterality: Left;   ORIF TOE FRACTURE Left 06/08/2015   Procedure: OPEN REDUCTION INTERNAL FIXATION (ORIF) LEFT  FIFTH METATARSAL BASE FRACTURE NONUNION; CALCANEAL AUTOGRAFT ;  Surgeon: Wylene Simmer, MD;  Location: [place];  Service: Orthopedics;  Laterality: Left;   PATELLA RECONSTRUCTION Left 2005   PROSTATE ABLATION  2014   TOE AMPUTATION     partial amputation right great toe   WOUND DEBRIDEMENT Bilateral 11/30/2017   Procedure: DEBRIDEMENT WOUND;  Surgeon: Sharlotte Alamo, DPM;  Location: [facility];  Service: Podiatry;  Laterality: Bilateral;    Social History:  reports that he has never smoked. He has never used smokeless tobacco. He reports current alcohol use. He reports current drug use. Drugs: Marijuana and Cocaine.  Family History:  Family History  Problem Relation Age of Onset   Diabetes Mother    Diabetes Other      Prior to Admission medications   Medication Sig Start Date End Date Taking? Authorizing Provider  acetaminophen (TYLENOL) 325 MG tablet Take 2 tablets (650 mg total) by mouth every 6 (six) hours as needed for mild pain (or Fever >/= 101). 04/04/19   Gouru, Illene Silver, MD  amLODipine (NORVASC) 10 MG tablet Take 10 mg by mouth daily. 03/27/19   [provider]  amLODipine (NORVASC) 10 MG tablet Take by mouth. 01/04/20   [provider]  aspirin 81 MG EC tablet Take by mouth.    [provider]  aspirin EC 81 MG tablet Take 81 mg by mouth daily.    [provider]  Calcium Carbonate-Vit D-Min (GNP CALCIUM 1200) 1200-1000 MG-UNIT CHEW Chew 1,200 mg by mouth daily with breakfast. Take in combination with vitamin D and magnesium. Patient not taking: Reported on 07/26/2019 04/04/19 10/01/19  Nicholes Mango, MD  chlorthalidone (HYGROTON) 25 MG tablet Take 25 mg by mouth daily. 05/08/20   [provider]  hydrochlorothiazide (HYDRODIURIL) 25 MG tablet Take 25 mg by mouth daily. Patient not taking: Reported on 07/20/2020 03/07/19   [provider]  insulin glargine (LANTUS SOLOSTAR) 100 UNIT/ML Solostar Pen Inject into the skin. 07/06/20    [provider]  JARDIANCE 10 MG TABS tablet Take 10 mg by mouth daily. 05/08/20   [provider]  LANTUS SOLOSTAR 100 UNIT/ML Solostar Pen Take 35 unit subcutaneously every night 07/06/20   [provider]  losartan (COZAAR) 100 MG tablet Take 100 mg by mouth daily.     [provider]  losartan (COZAAR) 100 MG tablet Take by mouth.    [provider]  metFORMIN (GLUCOPHAGE) 1000 MG tablet TAKE 1 TABLET TWICE DAILY FOR DIABETES 05/02/20   [provider]  metFORMIN (GLUCOPHAGE) 1000 MG tablet Take by mouth. 10/21/15   [provider]  Multiple Vitamin (MULTIVITAMIN) capsule Take 1 capsule by mouth daily.    [provider]  simvastatin (ZOCOR) 20 MG tablet Take 20 mg by mouth daily. 02/23/19   [provider]  simvastatin (ZOCOR) 20 MG tablet Take by mouth. 12/17/19   [provider]  TRUEplus Lancets [place]  02/22/20   [provider]    Physical Exam: Vitals:   12/12/21 1400 12/12/21 1430 12/12/21 1500 12/12/21 1550  BP: 126/81 105/72 123/74 137/67  Pulse: 97 91 90 95  Resp:    18  Temp:    97.8 F (36.6 C)  TempSrc:      SpO2: 96% 93% 94% 95%  Weight:      Height:       General: Not in acute distress HEENT:       Eyes: PERRL, EOMI, no scleral icterus.       ENT: No discharge from the ears and nose, no pharynx injection, no tonsillar enlargement.        Neck: No JVD, no bruit, no mass felt. Heme: No neck lymph node enlargement. Cardiac: S1/S2, RRR, No murmurs, No gallops or rubs. Respiratory: No rales, wheezing, rhonchi or rubs. GI: Soft, nondistended, nontender, no rebound pain, no organomegaly, BS present. GU: No hematuria Ext: No pitting leg edema bilaterally. 1+DP/PT pulse bilaterally. Musculoskeletal: No joint deformities, No joint redness or warmth, no limitation of ROM in spin. Skin: has open wound, without active drainage, with surrounding maceration.    Neuro: Alert,  oriented X3, cranial nerves II-XII grossly intact, moves all extremities normally. Psych: Patient is not psychotic, no suicidal or hemocidal ideation.  Labs on Admission: I have personally reviewed following labs and imaging studies  CBC: Recent Labs  Lab 12/12/21 1041  WBC 7.5  NEUTROABS 5.1  HGB 14.2  HCT 43.3  MCV 91.7  PLT 462   Basic Metabolic Panel: Recent Labs  Lab 12/12/21 1041  NA 134*  K 3.7  CL 100  CO2 26  GLUCOSE 193*  BUN 22*  CREATININE 0.92  CALCIUM 9.1   GFR: Estimated Creatinine Clearance: 107.7 mL/min (by C-G formula based on SCr of 0.92 mg/dL). Liver Function Tests: Recent Labs  Lab 12/12/21 1041  AST 20  ALT 20  ALKPHOS 88  BILITOT 0.6  PROT 8.0  ALBUMIN 3.9   No results for input(s): "LIPASE", "AMYLASE" in the last 168 hours. No results for input(s): "AMMONIA" in the last 168 hours. Coagulation Profile: Recent Labs  Lab 12/12/21 1041  INR 0.9   Cardiac Enzymes: No results for input(s): "CKTOTAL", "CKMB", "CKMBINDEX", "TROPONINI" in the last 168 hours. BNP (last 3 results) No results for input(s): "PROBNP" in the last 8760 hours. HbA1C: No results for input(s): "HGBA1C" in the last 72 hours. CBG: Recent Labs  Lab 12/12/21 1555  GLUCAP 124*   Lipid Profile: No results for input(s): "CHOL", "HDL", "LDLCALC", "TRIG", "CHOLHDL", "LDLDIRECT" in the last 72 hours. Thyroid Function Tests: No results for input(s): "TSH", "T4TOTAL", "FREET4", "T3FREE", "THYROIDAB" in the last 72 hours. Anemia Panel: No results for input(s): "VITAMINB12", "FOLATE", "FERRITIN", "TIBC", "IRON", "RETICCTPCT" in the last 72 hours. Urine analysis:    Component Value Date/Time   COLORURINE STRAW (A) 12/12/2021 1041   APPEARANCEUR CLEAR (A) 12/12/2021 1041   LABSPEC 1.015 12/12/2021 1041   PHURINE 6.0 12/12/2021 1041   GLUCOSEU >=500 (A) 12/12/2021 1041   HGBUR SMALL (A) 12/12/2021 1041   BILIRUBINUR NEGATIVE 12/12/2021 [place]  12/12/2021 1041   PROTEINUR NEGATIVE 12/12/2021 1041   NITRITE NEGATIVE 12/12/2021 1041   LEUKOCYTESUR NEGATIVE 12/12/2021 1041   Sepsis Labs: $[id number]'[email or web address]'[id number]$ (procalcitonin:4,lacticidven:4) )No results found for this or any previous visit (from the past 240 hour(s)).   Radiological Exams on Admission: MR FOOT LEFT WO CONTRAST  Result Date: 12/12/2021 CLINICAL DATA:  Foot swelling.  History of poorly controlled diabetes and history of osteomyelitis. Recurrent diabetic foot wounds. Osteomyelitis suspected. Based on area of interest hindfoot covered with extended field of view. EXAM: MRI OF THE LEFT FOOT WITHOUT CONTRAST TECHNIQUE: Multiplanar, multisequence MR imaging of the left hindfoot and midfoot was performed. No intravenous contrast was administered. COMPARISON:  left foot radiographs 11/28/2017; MRI left foot without and with contrast 11/29/2017 FINDINGS: TENDONS Peroneal: The peroneus brevis tendon extends towards the resected base of the fifth metatarsal. The peroneus longus tendon appears intact. Posteromedial: Mild posterior tibial tenosynovitis. The flexor digitorum longus and flexor hallucis longus tendons are intact. Anterior: The tibialis anterior, extensor hallucis longus, and extensor digitorum longus tendons are intact. Achilles: Intact. Plantar Fascia: Intact. Edema within the proximal abductor hallucis, flexor digitorum brevis and abductor digiti minimi muscles. LIGAMENTS Lateral: Attenuation of the calcaneofibular ligament. Mild thickening of the anterior talofibular ligament, possibly the sequela of remote trauma. No fluid bright tear. The anterior and posterior tibiofibular and posterior talofibular ligaments are intact. Medial: The tibiotalar deep deltoid and tibial spring ligaments are intact. CARTILAGE Ankle Joint: Intact cartilage. Subtalar Joints/Sinus Tarsi: Fat is preserved within sinus tarsi. Bones: Interval postsurgical changes of resection of the proximal third of fifth  metatarsal where previously there was concern for osteomyelitis on 11/29/2017 MRI. There is ulceration again seen at the plantar lateral aspect of the surgical site. There is decreased T1 and increased T2 linear signal within the overlying skin (axial series 6 images 33-35, coronal series 8, images 14 through 16) that appears to represent an ulcer and sinus tract contacting the plantar lateral aspect of the cuboid. There is moderate marrow edema within the plantar lateral aspect of the cuboid (coronal series 8, image 18, axial series 6, image 30) with cortical erosion of the plantar lateral cuboid indicating osteomyelitis. There is mild marrow edema within the adjacent posterolateral aspect of the postsurgical remaining portion of fifth metatarsal and more moderate marrow edema within the proximal lateral aspect of the base of fourth metatarsal (axial series 6, image 31 and sagittal series 10, image 15) also concerning for osteomyelitis. There is metallic susceptibility artifact around the region of marrow fat that may represent a prior screw tract within the calcaneus (sagittal series 9, images 15-18, axial series 6, images 24 and 25). Other: Nonspecific plantar and dorsal midfoot edema/myositis. Moderate ankle hindfoot, and midfoot subcutaneous fat edema and swelling. IMPRESSION: Compared to 11/29/2017: Interval resection of the proximal third of fifth metatarsal, an area of osteomyelitis on the prior MRI. There is a persistent ulceration of the plantar lateral aspect of the resection site, with sinus tract extending towards the adjacent cuboid bone. Findings suspicious for osteomyelitis of the plantar lateral aspect of the cuboid and adjacent base of fourth metatarsal and possibly the proximal most aspect of the remaining fifth metatarsal shaft. Electronically Signed   By: Yvonne Kendall M.D.   On: 12/12/2021 17:30   DG Chest 2 View  Result Date: 12/12/2021 CLINICAL DATA:  Suspected sepsis. EXAM: CHEST - 2  VIEW COMPARISON:  Chest radiograph 06/25/2019. FINDINGS: Similar mildly elevated left hemidiaphragm. Similar low lung volumes. No consolidation. No visible pleural effusions or pneumothorax. Cardiomediastinal silhouette is within normal limits and similar to prior. No evidence of acute osseous abnormality. Degenerative changes of the thoracic spine with bridging osteophytes. IMPRESSION: Similar low lung volumes without evidence of acute cardiopulmonary disease. Electronically Signed   By: Margaretha Sheffield M.D.   On: 12/12/2021 11:10      Assessment/Plan Principal Problem:  Diabetic infection of left foot ([place]) Active Problems:   HTN (hypertension)   HLD (hyperlipidemia)   Type 2 diabetes mellitus with diabetic polyneuropathy, with long-term current use of insulin (HCC)   OSA (obstructive sleep apnea)   Principal Problem:   Diabetic infection of left foot (HCC) Active Problems:   HTN (hypertension)   HLD (hyperlipidemia)   Type 2 diabetes mellitus with diabetic polyneuropathy, with long-term current use of insulin (HCC)   OSA (obstructive sleep apnea)   Assessment and Plan: * Diabetic infection of left foot ([place]) Patient is not septic.  No fever or leukocytosis.  Per Dr. Vickki Muff of podiatry, wound culture done in office showed pseudomonas, morganella and mixed skin flora. He recommend starting IV antibiotics may  - Admitted to MedSurg bed as inpatient - Empiric antimicrobial treatment with vancomycin and zosyn - PRN Zofran for nausea, and Percocet for pain - Blood cultures x 2  - ESR and CRP - wound care consult - MRI-left foot    HTN (hypertension) - IV hydralazine as needed -Amlodipine, Hygroton, Cozaar,  HLD (hyperlipidemia) - Zocor  Type 2 diabetes mellitus with diabetic polyneuropathy, with long-term current use of insulin (HCC) A1c 7.7 recently, poorly controlled.  Patient taking Jardiance, metformin and Lantus 45 units daily -Sliding scale insulin -Glargine  insulin 30 units daily  OSA (obstructive sleep apnea) - CPAP             DVT ppx: SQ Heparin    Code Status: Full code  Family Communication: not done, no family member is at bed side.     Disposition Plan:  Anticipate discharge back to previous environment  Consults called:  Dr. Vickki Muff will see pt   Admission status and Level of care: Med-Surg:    as inpt      Severity of Illness:  The appropriate patient status for this patient is INPATIENT. Inpatient status is judged to be reasonable and necessary in order to provide the required intensity of service to ensure the patient's safety. The patient's presenting symptoms, physical exam findings, and initial radiographic and laboratory data in the context of their chronic comorbidities is felt to place them at high risk for further clinical deterioration. Furthermore, it is not anticipated that the patient will be medically stable for discharge from the hospital within 2 midnights of admission.   * I certify that at the point of admission it is my clinical judgment that the patient will require inpatient hospital care spanning beyond 2 midnights from the point of admission due to high intensity of service, high risk for further deterioration and high frequency of surveillance required.*       Date of Service 12/12/2021    Ivor Costa [facility]   If 7PM-7AM, please contact night-coverage [email or web address] 12/12/2021, 6:19 PM

## 2021-12-12 NOTE — Assessment & Plan Note (Signed)
Patient is not septic.  No fever or leukocytosis.  Per Dr. Vickki Muff of podiatry, wound culture done in office showed pseudomonas, morganella and mixed skin flora. He recommend starting IV antibiotics may  - Admitted to MedSurg bed as inpatient - Empiric antimicrobial treatment with vancomycin and zosyn - PRN Zofran for nausea, and Percocet for pain - Blood cultures x 2  - ESR and CRP - wound care consult - MRI-left foot

## 2021-12-12 NOTE — Consult Note (Signed)
PHARMACY -  BRIEF ANTIBIOTIC NOTE   Pharmacy has received consult(s) for vancomycin from an ED provider.  The patient's profile has been reviewed for ht/wt/allergies/indication/available labs.    One time order(s) placed for vancomycin 2500 mg IV   Further antibiotics/pharmacy consults should be ordered by admitting physician if indicated.                       Thank you, Darnelle Bos, PharmD 12/12/2021  1:17 PM

## 2021-12-12 NOTE — ED Notes (Signed)
Informed RN bed assigned 

## 2021-12-12 NOTE — Assessment & Plan Note (Signed)
CPAP.  

## 2021-12-12 NOTE — Progress Notes (Signed)
Admission profile updated. ?

## 2021-12-12 NOTE — Consult Note (Signed)
Pharmacy Antibiotic Note  [name] is a 61 y.o. male admitted on 12/12/2021 with a diabetic foot infection. Pharmacy has been consulted for vancomycin and Zosyn dosing.  Plan: Vancomycin 2500 mg IV loading dose, followed by 1000 mg IV q12h  Goal AUC 400-550  Est AUC: 454.7 Est Cmax: 29.1 Est Cmin: 12.4 Calculated with SCr 0.92, Vd 0.5 (BMI 35)  Zosyn 3.375 gm IV q8h (4 hour infusion)  Monitor clinical picture, renal function, and vancomycin levels at steady state F/U C&S, abx deescalation / LOT   Height: '5\' 10"'$  (177.8 cm) Weight: 113.4 kg (250 lb) IBW/kg (Calculated) : 73  Temp (24hrs), Avg:98.1 F (36.7 C), Min:97.8 F (36.6 C), Max:98.3 F (36.8 C)  Recent Labs  Lab 12/12/21 1041 12/12/21 1317  WBC 7.5  --   CREATININE 0.92  --   LATICACIDVEN 1.8 1.7    Estimated Creatinine Clearance: 107.7 mL/min (by C-G formula based on SCr of 0.92 mg/dL).    Allergies  Allergen Reactions   Sulfamethoxazole-Trimethoprim Hives and Rash    Antimicrobials this admission: 6/14 vancomycin >>  6/14 Zosyn >>   Dose adjustments this admission:   Microbiology results: 6/14 BCx: pending  of this patient's care.  Darnelle Bos, PharmD 12/12/2021 4:10 PM

## 2021-12-12 NOTE — Assessment & Plan Note (Signed)
-   IV hydralazine as needed -Amlodipine, Hygroton, Cozaar,

## 2021-12-12 NOTE — Assessment & Plan Note (Signed)
A1c 7.7 recently, poorly controlled.  Patient taking Jardiance, metformin and Lantus 45 units daily -Sliding scale insulin -Glargine insulin 30 units daily

## 2021-12-12 NOTE — ED Provider Notes (Signed)
[facility] Provider Note    Event Date/Time   First MD Initiated Contact with Patient 12/12/21 1301     (approximate)   History   Foot Pain   HPI  [name] is a 61 y.o. male with a history of poorly controlled diabetes as well as history of osteomyelitis and recurrent diabetic foot wounds presents to the ER from podiatry clinic for admission to the hospital for IV antibiotics and possible debridement.  States he was otherwise doing well for several weeks thinks he developed blistering that is progressed since then.  Has large ulceration extends down to the bone of the left foot.  No fevers or chills.  No nausea or vomiting     Physical Exam   Triage Vital Signs: ED Triage Vitals [12/12/21 1039]  Enc Vitals Group     BP (!) 150/90     Pulse Rate 99     Resp 18     Temp 98.3 F (36.8 C)     Temp Source Oral     SpO2 98 %     Weight 250 lb (113.4 kg)     Height '5\' 10"'$  (1.778 m)     Head Circumference      Peak Flow      Pain Score 8     Pain Loc      Pain Edu?      Excl. in [place]?     Most recent vital signs: Vitals:   12/12/21 1039  BP: (!) 150/90  Pulse: 99  Resp: 18  Temp: 98.3 F (36.8 C)  SpO2: 98%     Constitutional: Alert  Eyes: Conjunctivae are normal.  Head: Atraumatic. Nose: No congestion/rhinnorhea. Mouth/Throat: Mucous membranes are moist.   Neck: Painless ROM.  Cardiovascular:   Good peripheral circulation. Respiratory: Normal respiratory effort.  No retractions.  Gastrointestinal: Soft and nontender.  Musculoskeletal:  no deformity, ulceration to the left foot currently wrapped from podiatry clinic.  Wrist cap refill distally. Neurologic:  MAE spontaneously. No gross focal neurologic deficits are appreciated.  Skin:  Skin is warm, dry and intact. No rash noted. Psychiatric: Mood and affect are normal. Speech and behavior are normal.    ED Results / Procedures / Treatments   Labs (all labs ordered are  listed, but only abnormal results are displayed) Labs Reviewed  COMPREHENSIVE METABOLIC PANEL - Abnormal; Notable for the following components:      Result Value   Sodium 134 (*)    Glucose, Bld 193 (*)    BUN 22 (*)    All other components within normal limits  URINALYSIS, ROUTINE W REFLEX MICROSCOPIC - Abnormal; Notable for the following components:   Color, Urine STRAW (*)    APPearance CLEAR (*)    Glucose, UA >=500 (*)    Hgb urine dipstick SMALL (*)    All other components within normal limits  CULTURE, BLOOD (ROUTINE X 2)  CULTURE, BLOOD (ROUTINE X 2)  LACTIC ACID, PLASMA  CBC WITH DIFFERENTIAL/PLATELET  PROTIME-INR  LACTIC ACID, PLASMA  SEDIMENTATION RATE  C-REACTIVE PROTEIN  APTT     EKG     RADIOLOGY Please see ED Course for my review and interpretation.  I personally reviewed all radiographic images ordered to evaluate for the above acute complaints and reviewed radiology reports and findings.  These findings were personally discussed with the patient.  Please see medical record for radiology report.    PROCEDURES:  Critical Care performed: No  Procedures   MEDICATIONS ORDERED IN ED: Medications  morphine (PF) 4 MG/ML injection 4 mg (4 mg Intravenous Given 12/12/21 1330)  ondansetron (ZOFRAN) injection 4 mg (has no administration in time range)  acetaminophen (TYLENOL) tablet 650 mg (has no administration in time range)  hydrALAZINE (APRESOLINE) injection 5 mg (has no administration in time range)  ondansetron (ZOFRAN) injection 4 mg (4 mg Intravenous Given 12/12/21 1330)     IMPRESSION / MDM / ASSESSMENT AND PLAN / ED COURSE  I reviewed the triage vital signs and the nursing notes.                              Differential diagnosis includes, but is not limited to, cellulitis, osteomyelitis, diabetic foot wound, sepsis, PAD  Patient presented to the ER for evaluation of symptoms as described above.  This presenting complaint could reflect a  potentially life-threatening illness therefore the patient will be placed on continuous pulse oximetry and telemetry for monitoring.  Laboratory evaluation will be sent to evaluate for the above complaints.      Clinical Course as of 12/12/21 1332  Wed Dec 12, 2021  1301 Chest x-ray by my interpretation shows no evidence of infiltrate. [PR]  1328 Case discussed in consultation with hospitalist who agreed admit patient to their service. [PR]  1331 In discussion with Dr. Blaine Hamper hospitalist group as the patient is not septic we will hold off on IV antibiotics at this moment pending MRI and further evaluation [PR]    Clinical Course User Index [PR] Merlyn Lot, MD    FINAL CLINICAL IMPRESSION(S) / ED DIAGNOSES   Final diagnoses:  Diabetic infection of left foot ([place])     Rx / DC Orders   ED Discharge Orders     None        Note:  This document was prepared using Dragon voice recognition software and may include unintentional dictation errors.    Merlyn Lot, MD 12/12/21 1332

## 2021-12-12 NOTE — Progress Notes (Signed)
Pt has home CPAP unit. The unit was checked for frays in the cord. No frays were seen and the pt states it is in good working order. Biomed was called.

## 2021-12-12 NOTE — Assessment & Plan Note (Signed)
-   Zocor 

## 2021-12-13 ENCOUNTER — Inpatient Hospital Stay: Payer: 59

## 2021-12-13 DIAGNOSIS — E785 Hyperlipidemia, unspecified: Secondary | ICD-10-CM | POA: Diagnosis not present

## 2021-12-13 DIAGNOSIS — E1142 Type 2 diabetes mellitus with diabetic polyneuropathy: Secondary | ICD-10-CM | POA: Diagnosis not present

## 2021-12-13 DIAGNOSIS — I10 Essential (primary) hypertension: Secondary | ICD-10-CM | POA: Diagnosis not present

## 2021-12-13 DIAGNOSIS — G4733 Obstructive sleep apnea (adult) (pediatric): Secondary | ICD-10-CM | POA: Diagnosis not present

## 2021-12-13 DIAGNOSIS — E11628 Type 2 diabetes mellitus with other skin complications: Secondary | ICD-10-CM | POA: Diagnosis not present

## 2021-12-13 LAB — HIV ANTIBODY (ROUTINE TESTING W REFLEX): HIV Screen 4th Generation wRfx: NONREACTIVE

## 2021-12-13 LAB — GLUCOSE, CAPILLARY
Glucose-Capillary: 163 mg/dL — ABNORMAL HIGH (ref 70–99)
Glucose-Capillary: 119 mg/dL — ABNORMAL HIGH (ref 70–99)
Glucose-Capillary: 246 mg/dL — ABNORMAL HIGH (ref 70–99)
Glucose-Capillary: 213 mg/dL — ABNORMAL HIGH (ref 70–99)

## 2021-12-13 LAB — CBC
HCT: 41.9 % (ref 39.0–52.0)
Hemoglobin: 13.9 g/dL (ref 13.0–17.0)
MCH: 30.5 pg (ref 26.0–34.0)
MCHC: 33.2 g/dL (ref 30.0–36.0)
MCV: 91.9 fL (ref 80.0–100.0)
Platelets: 266 10*3/uL (ref 150–400)
RBC: 4.56 MIL/uL (ref 4.22–5.81)
RDW: 12.8 % (ref 11.5–15.5)
WBC: 7.4 10*3/uL (ref 4.0–10.5)
nRBC: 0 % (ref 0.0–0.2)

## 2021-12-13 LAB — CREATININE, SERUM
Creatinine, Ser: 1.03 mg/dL (ref 0.61–1.24)
GFR, Estimated: >60 mL/min (ref >60)

## 2021-12-13 MED ORDER — POVIDONE-IODINE 10 % EX SWAB: Freq: Once | CUTANEOUS | Status: DC

## 2021-12-13 MED ORDER — CHLORHEXIDINE GLUCONATE 4 % EX LIQD
60.0000 mL | Freq: Once | CUTANEOUS | Status: AC
  Administered 2021-12-14: 4 via TOPICAL

## 2021-12-13 MED ORDER — PIPERACILLIN-TAZOBACTAM 3.375 G IVPB
3.3750 g | Freq: Three times a day (TID) | INTRAVENOUS | Status: DC
Start: 2021-12-13 — End: 2021-12-18
  Administered 2021-12-13 – 2021-12-18 (×16): 3.375 g via INTRAVENOUS
  Filled 2021-12-13 (×16): qty 50

## 2021-12-13 NOTE — Progress Notes (Signed)
1500-- consent signed and placed in chart for I&D 12/14/2021

## 2021-12-13 NOTE — Consult Note (Signed)
NAME: [name]  DOB: [birth year]  MRN: [id number]  Date/Time: 12/13/2021 4:19 PM  REQUESTING PROVIDER: Dr.Fowler Subjective:  REASON FOR CONSULT: left foot infection ? [name] is a 61 y.o. with a history of DM, peripheral neuropathy DFI, HLD, HTN, OSA, BPH Presents with worsening pain and ulcer left foot Pt has  had an ulcer on the left foot for more than a month- it initially started as a blister And he was followed by podiatrist- on 6/7 a culture was taken as there was some tunneling noted- he was prescribed Doxy , but the culture came positive for morganella and the antibiotic was changed to quinolone which he has not taken yet He went back yesterday for  follow up and as the wound was not looking good he was asked to go to the Ed for IV antibiotics, MRI and surgical intervention PT has no fever or chills In the ED vitals 171/90, temp 97.7, pulse 93, sats 100% He has a h/o of rt 5th toe partial ray amputation and left 5th metatarsal debridement in June 2019, He received 4 weeks of IV ceftriaxone thru PICC that time Cr 0.92, Glucose 193, wbc 7.5, HB 14.2, plt 279   Past Medical History:  Diagnosis Date   Arthritis    Bacterial infection due to Morganella morganii 12/26/2017   BPH (benign prostatic hypertrophy)    Diabetes mellitus    Diabetic neuropathy ([place])    Hypertension    Metatarsal bone fracture    left 5th toe   Osteomyelitis of toe of right foot ([place])    Peripheral vascular disease ([place])    Post-operative infection    and diabetic ulcer left foot   Sepsis due to Streptococcus, group B ([place]) 12/26/2017   Sleep apnea     does wear CPAP   Wears glasses     Past Surgical History:  Procedure Laterality Date   AMPUTATION TOE Left    small toe   AMPUTATION TOE Right 04/02/2019   Procedure: AMPUTATION TOE 2ND;  Surgeon: Samara Deist, DPM;  Location: [facility];  Service: Podiatry;  Laterality: Right;   COLONOSCOPY W/ BIOPSIES AND POLYPECTOMY     I & D  EXTREMITY Left 01/11/2016   Procedure: LEFT FOOT IRRIGATION AND DEBRIDEMENT WOUND VAC AND REMOVAL OF HARDWARE;  Surgeon: Wylene Simmer, MD;  Location: [place];  Service: Orthopedics;  Laterality: Left;   LOWER EXTREMITY ANGIOGRAPHY N/A 12/01/2017   Procedure: Lower Extremity Angiography;  Surgeon: Algernon Huxley, MD;  Location: [place] [facility];  Service: Cardiovascular;  Laterality: N/A;   LOWER EXTREMITY ANGIOGRAPHY Right 12/04/2017   Procedure: Lower Extremity Angiography;  Surgeon: Algernon Huxley, MD;  Location: [place] [facility];  Service: Cardiovascular;  Laterality: Right;   LOWER EXTREMITY ANGIOGRAPHY Left 03/12/2018   Procedure: LOWER EXTREMITY ANGIOGRAPHY;  Surgeon: Algernon Huxley, MD;  Location: [place] [facility];  Service: Cardiovascular;  Laterality: Left;   ORIF TOE FRACTURE Left 06/08/2015   Procedure: OPEN REDUCTION INTERNAL FIXATION (ORIF) LEFT FIFTH METATARSAL BASE FRACTURE NONUNION; CALCANEAL AUTOGRAFT ;  Surgeon: Wylene Simmer, MD;  Location: [place];  Service: Orthopedics;  Laterality: Left;   PATELLA RECONSTRUCTION Left 2005   PROSTATE ABLATION  2014   TOE AMPUTATION     partial amputation right great toe   WOUND DEBRIDEMENT Bilateral 11/30/2017   Procedure: DEBRIDEMENT WOUND;  Surgeon: Sharlotte Alamo, DPM;  Location: [facility];  Service: Podiatry;  Laterality: Bilateral;    Social History  Socioeconomic History   Marital status: Married    Spouse name: [name]   Number of children: 2   Years of education: Not on file   Highest education level: Not on file  Occupational History   Not on file  Tobacco Use   Smoking status: Never   Smokeless tobacco: Never  Vaping Use   Vaping Use: Never used  Substance and Sexual Activity   Alcohol use: Yes    Comment: occasional    Drug use: Yes    Types: Marijuana, Cocaine    Comment: last month   Sexual activity: Not on file  Other Topics Concern   Not on file  Social History Narrative   Lives with wife. Children all  grown up.   Social Determinants of Health   Financial Resource Strain: Low Risk  (04/01/2019)   Overall Financial Resource Strain (CARDIA)    Difficulty of Paying Living Expenses: Not very hard  Food Insecurity: No Food Insecurity (04/01/2019)   Hunger Vital Sign    Worried About Running Out of Food in the Last Year: Never true    Ran Out of Food in the Last Year: Never true  Transportation Needs: No Transportation Needs (04/01/2019)   PRAPARE - Hydrologist (Medical): No    Lack of Transportation (Non-Medical): No  Physical Activity: Insufficiently Active (04/01/2019)   Exercise Vital Sign    Days of Exercise per Week: 1 day    Minutes of Exercise per Session: 60 min  Stress: No Stress Concern Present (04/01/2019)   [place]    Feeling of Stress : Not at all  Social Connections: [place] (04/01/2019)   Social Connection and Isolation Panel [NHANES]    Frequency of Communication with Friends and Family: More than three times a week    Frequency of Social Gatherings with Friends and Family: More than three times a week    Attends Religious Services: More than 4 times per year    Active Member of [employer] or Organizations: Yes    Attends Music therapist: More than 4 times per year    Marital Status: Married  Human resources officer Violence: Not At Risk (04/01/2019)   Humiliation, Afraid, Rape, and Kick questionnaire    Fear of Current or Ex-Partner: No    Emotionally Abused: No    Physically Abused: No    Sexually Abused: No    Family History  Problem Relation Age of Onset   Diabetes Mother    Diabetes Other    Allergies  Allergen Reactions   Sulfamethoxazole-Trimethoprim Hives and Rash   I? Current Facility-Administered Medications  Medication Dose Route Frequency Provider Last Rate Last Admin   acetaminophen (TYLENOL) tablet 650 mg  650 mg Oral Q6H PRN Ivor Costa,  MD       amLODipine (NORVASC) tablet 10 mg  10 mg Oral Daily Ivor Costa, MD       aspirin EC tablet 81 mg  81 mg Oral Daily Ivor Costa, MD       chlorhexidine (HIBICLENS) 4 % liquid 4 Application  60 mL Topical Once Samara Deist, DPM       chlorthalidone (HYGROTON) tablet 25 mg  25 mg Oral Daily Ivor Costa, MD       heparin injection 5,000 Units  5,000 Units Subcutaneous Q8H Ivor Costa, MD   5,000 Units at 12/13/21 1458   hydrALAZINE (APRESOLINE) injection 5 mg  5 mg Intravenous  Q2H PRN Ivor Costa, MD       insulin aspart (novoLOG) injection 0-5 Units  0-5 Units Subcutaneous QHS Ivor Costa, MD       insulin aspart (novoLOG) injection 0-9 Units  0-9 Units Subcutaneous TID WC Ivor Costa, MD   2 Units at 12/13/21 0905   insulin glargine-yfgn (SEMGLEE) injection 30 Units  30 Units Subcutaneous QHS Ivor Costa, MD   30 Units at 12/12/21 2300   losartan (COZAAR) tablet 100 mg  100 mg Oral Daily Ivor Costa, MD       morphine (PF) 4 MG/ML injection 4 mg  4 mg Intravenous Q3H PRN Merlyn Lot, MD   4 mg at 12/12/21 1330   ondansetron (ZOFRAN) injection 4 mg  4 mg Intravenous Q8H PRN Ivor Costa, MD       oxyCODONE-acetaminophen (PERCOCET/ROXICET) 5-325 MG per tablet 1 tablet  1 tablet Oral Q4H PRN Ivor Costa, MD   1 tablet at 12/13/21 1507   piperacillin-tazobactam (ZOSYN) IVPB 3.375 g  3.375 g Intravenous Q8H Renda Rolls, RPH 12.5 mL/hr at 12/13/21 0649 Infusion Verify at 12/13/21 0649   povidone-iodine 10 % swab   Topical Once Samara Deist, DPM       simvastatin (ZOCOR) tablet 20 mg  20 mg Oral q1800 Ivor Costa, MD   20 mg at 12/12/21 1821   vancomycin (VANCOCIN) IVPB 1000 mg/200 mL premix  1,000 mg Intravenous Q12H Darnelle Bos, RPH 200 mL/hr at 12/13/21 5573 Infusion Verify at 12/13/21 2202   Facility-Administered Medications Ordered in Other Encounters  Medication Dose Route Frequency Provider Last Rate Last Admin   Tdap (BOOSTRIX) injection 0.5 mL  0.5 mL Intramuscular Once  Betancourt, Tina A, NP         Abtx:  Anti-infectives (From admission, onward)    Start     Dose/Rate Route Frequency Ordered Stop   12/13/21 0600  vancomycin (VANCOCIN) IVPB 1000 mg/200 mL premix        1,000 mg 200 mL/hr over 60 Minutes Intravenous Every 12 hours 12/12/21 1746     12/13/21 0600  piperacillin-tazobactam (ZOSYN) IVPB 3.375 g        3.375 g 12.5 mL/hr over 240 Minutes Intravenous Every 8 hours 12/13/21 0334     12/12/21 1930  piperacillin-tazobactam (ZOSYN) IVPB 3.375 g  Status:  Discontinued        3.375 g 12.5 mL/hr over 240 Minutes Intravenous Every 8 hours 12/12/21 1609 12/13/21 0334   12/12/21 1800  vancomycin (VANCOREADY) IVPB 1500 mg/300 mL       See Hyperspace for full Linked Orders Report.   1,500 mg 150 mL/hr over 120 Minutes Intravenous  Once 12/12/21 1609 12/12/21 2021   12/12/21 1700  vancomycin (VANCOCIN) IVPB 1000 mg/200 mL premix       See Hyperspace for full Linked Orders Report.   1,000 mg 200 mL/hr over 60 Minutes Intravenous  Once 12/12/21 1609 12/12/21 2133   12/12/21 1500  vancomycin (VANCOREADY) IVPB 1500 mg/300 mL  Status:  Discontinued       See Hyperspace for full Linked Orders Report.   1,500 mg 150 mL/hr over 120 Minutes Intravenous  Once 12/12/21 1338 12/12/21 1348   12/12/21 1345  vancomycin (VANCOCIN) IVPB 1000 mg/200 mL premix  Status:  Discontinued       See Hyperspace for full Linked Orders Report.   1,000 mg 200 mL/hr over 60 Minutes Intravenous  Once 12/12/21 1338 12/12/21 1348   12/12/21 1330  vancomycin (VANCOCIN)  2,500 mg in sodium chloride 0.9 % 500 mL IVPB  Status:  Discontinued        2,500 mg 262.5 mL/hr over 120 Minutes Intravenous  Once 12/12/21 1328 12/12/21 1329   12/12/21 1315  piperacillin-tazobactam (ZOSYN) IVPB 3.375 g  Status:  Discontinued        3.375 g 100 mL/hr over 30 Minutes Intravenous  Once 12/12/21 1312 12/12/21 1439       REVIEW OF SYSTEMS:  Const: negative fever, negative chills, negative weight  loss Eyes: negative diplopia or visual changes, negative eye pain ENT: negative coryza, negative sore throat Resp: negative cough, hemoptysis, dyspnea Cards: negative for chest pain, palpitations, lower extremity edema GU: negative for frequency, dysuria and hematuria GI: Negative for abdominal pain, diarrhea, bleeding, constipation Skin: negative for rash and pruritus Heme: negative for easy bruising and gum/nose bleeding MS: negative for myalgias, arthralgias, back pain and muscle weakness Neurolo:negative for headaches, dizziness, vertigo, memory problems  Psych: negative for feelings of anxiety, depression  Endocrine: , diabetes Allergy/Immunology-sulfa rash: Objective:  VITALS:  BP 137/76   Pulse 94   Temp 98.3 F (36.8 C) (Oral)   Resp 16   Ht $R'5\' 10"'Th$  (1.778 m)   Wt 113.4 kg   SpO2 96%   BMI 35.87 kg/m  PHYSICAL EXAM:  General: Alert, cooperative, no distress, appears stated age. obese Head: Normocephalic, without obvious abnormality, atraumatic. Eyes: Conjunctivae clear, anicteric sclerae. Pupils are equal ENT Nares normal. No drainage or sinus tenderness. Lips, mucosa, and tongue normal. No Thrush Neck: Supple, symmetrical, no adenopathy, thyroid: non tender no carotid bruit and no JVD. Back: No CVA tenderness. Lungs: Clear to auscultation bilaterally. No Wheezing or Rhonchi. No rales. Heart: Regular rate and rhythm, no murmur, rub or gallop. Abdomen: Soft, non-tender,not distended. Bowel sounds normal. No masses Extremities: left foot Ulcer on the lateral aspect of the plantar surface     Skin: No rashes or lesions. Or bruising Lymph: Cervical, supraclavicular normal. Neurologic: Grossly non-focal Pertinent Labs Lab Results CBC    Component Value Date/Time   WBC 7.4 12/13/2021 0526   RBC 4.56 12/13/2021 0526   HGB 13.9 12/13/2021 0526   HGB 13.0 03/26/2014 2050   HCT 41.9 12/13/2021 0526   HCT 39.7 (L) 03/26/2014 2050   PLT 266 12/13/2021 0526   PLT  202 03/26/2014 2050   MCV 91.9 12/13/2021 0526   MCV 94 03/26/2014 2050   MCH 30.5 12/13/2021 0526   MCHC 33.2 12/13/2021 0526   RDW 12.8 12/13/2021 0526   RDW 12.9 03/26/2014 2050   LYMPHSABS 1.6 12/12/2021 1041   LYMPHSABS 2.1 11/01/2012 0500   MONOABS 0.7 12/12/2021 1041   MONOABS 0.9 11/01/2012 0500   EOSABS 0.1 12/12/2021 1041   EOSABS 0.2 11/01/2012 0500   BASOSABS 0.0 12/12/2021 1041   BASOSABS 0.0 11/01/2012 0500       Latest Ref Rng & Units 12/13/2021    5:26 AM 12/12/2021   10:41 AM 06/25/2019    4:53 AM  CMP  Glucose 70 - 99 mg/dL  193  274   BUN 6 - 20 mg/dL  22  18   Creatinine 0.61 - 1.24 mg/dL 1.03  0.92  0.98   Sodium 135 - 145 mmol/L  134  135   Potassium 3.5 - 5.1 mmol/L  3.7  4.0   Chloride 98 - 111 mmol/L  100  97   CO2 22 - 32 mmol/L  26  26   Calcium 8.9 - 10.3 mg/dL  9.1  9.3   Total Protein 6.5 - 8.1 g/dL  8.0  7.2   Total Bilirubin 0.3 - 1.2 mg/dL  0.6  0.3   Alkaline Phos 38 - 126 U/L  88  75   AST 15 - 41 U/L  20  17   ALT 0 - 44 U/L  20  17       Microbiology: Recent Results (from the past 240 hour(s))  Culture, blood (Routine x 2)     Status: None (Preliminary result)   Collection Time: 12/12/21 10:41 AM   Specimen: BLOOD  Result Value Ref Range Status   Specimen Description BLOOD RIGHT [facility]  Final   Special Requests   Final    BOTTLES DRAWN AEROBIC AND ANAEROBIC Blood Culture adequate volume   Culture   Final    NO GROWTH < 24 HOURS Performed at [facility], [address] [place], [place] [address]    Report Status PENDING  Incomplete  Culture, blood (Routine x 2)     Status: None (Preliminary result)   Collection Time: 12/12/21  1:18 PM   Specimen: BLOOD  Result Value Ref Range Status   Specimen Description BLOOD BLOOD RIGHT FOREARM  Final   Special Requests   Final    BOTTLES DRAWN AEROBIC AND ANAEROBIC Blood Culture adequate volume   Culture   Final    NO GROWTH < 24 HOURS Performed at [facility], [address] [place], [place] [address]    Report Status PENDING  Incomplete    IMAGING RESULTS: CXR no acute findings MRI left foot  I have personally reviewed the films Ulceration of the plantar lateral aspect with sinus tract extending to the adjacent cuboid bone?  Impression/Recommendation DM with peripheral neuropathy with Diabetic foot infection with recurrent ulceration of the left 5th metatarsal area with previos resection of the proximal third of the met Pt has osteo of the remaining 5th shaft, possible 4th and sinus leading to the  lateral aspect of cuboid  Morganella in culture from 6/7 Pt is going for surgery tomorrow Currently on vanco and zosyn- Dc vanco? The duration and route of  antibiotic will depend on the type of surgery , whether it is curative and bone pathology. Very likely it will be IV as I doubt this will be a curative surgery based on the position of the ulcer and underlying bones involved  DM on insulin  HTN on amlodipine, losartan and chlorthalidone  HLD on simvastatin  ___________________________________________________ Discussed with patient, in detail and with  requesting provider Note:  This document was prepared using Dragon voice recognition software and may include unintentional dictation errors.

## 2021-12-13 NOTE — Progress Notes (Signed)
Progress Note  Patient: [name] GXQ:[id number] DOB: 1961-05-03  DOA: 12/12/2021  DOS: 12/13/2021    Brief hospital course: [name] is a 61 y.o. male with a history of IDT2DM with diabetic neuropathy, HTN, HLD, PVD s/p multiple toe amputations, OSA on CPAP who was sent to the ED 6/14 from podiatry office for concern of worsening ulceration with evidence of tracking/deep probing to the plantar aspect of the left forefoot. He was afebrile with no leukocytosis, and MRI reveals changes consistent with osteomyelitis of the cuboid and 4th metatarsal for which I&D is planned 6/16. ID is consulted for antibiotic recommendations.   Assessment and Plan: Diabetic infection of left plantar foot with underlying sinus tract and osteomyelitis: Not septic. Reported wound culture done in office showing pseudomonas, morganella. - Per Dr. Vickki Muff, plan I&D in [place] 6/16. - Screening ABI ordered. - Vancomycin and zosyn were started on admission, will defer further management to ID.  - Pain control as ordered  IDT2DM: HbA1c 7.7%.  - Continue glargine insulin, lowered dose due to NPO plan.  - Continue SSI AC/HS.  - ASA, statin  HTN:  - Continue norvasc '10mg'$ , HCTZ '25mg'$ , losartan '100mg'$ .  - Hydralazine IV prn  HLD:  - Continue simvastatin.  OSA:  - CPAP qHS.  Obesity: Estimated body mass index is 35.87 kg/m as calculated from the following:   Height as of this encounter: '5\' 10"'$  (1.778 m).   Weight as of this encounter: 113.4 kg.   Subjective: Pain in left foot is overall stable to improved, mostly just with palpation/weight bearing. No fevers, otherwise feels well.  Objective: Vitals:   12/12/21 1550 12/12/21 1932 12/13/21 0338 12/13/21 0834  BP: 137/67 (!) 171/90 (!) 142/84 116/76  Pulse: 95 93 87 87  Resp: '18 20 20 20  '$ Temp: 97.8 F (36.6 C) 97.7 F (36.5 C) 98 F (36.7 C) 98.3 F (36.8 C)  TempSrc:  Oral Oral Oral  SpO2: 95% 100% 93% 96%  Weight:      Height:       Gen: Obese,  pleasant 61 y.o. male in no distress Pulm: Nonlabored breathing room air. Clear CV: Regular rate and rhythm. No murmur, rub, or gallop. No JVD, no dependent edema. GI: Abdomen soft, non-tender, non-distended, with normoactive bowel sounds.  Ext: Warm, no deformities, 2+ DP pulses Skin: Left lateral plantar foot with larger proximal and smaller distal ulcers with surrounding callus and mild spreading erythema. Proximal to larger ulcer is more fluctuant, suspect sinus tract, though no purulence expressed.  Neuro: Alert and oriented. No focal neurological deficits. Psych: Judgement and insight appear fair. Mood euthymic & affect congruent. Behavior is appropriate.    Data Personally reviewed: CBC: Recent Labs  Lab 12/12/21 1041 12/13/21 0526  WBC 7.5 7.4  NEUTROABS 5.1  --   HGB 14.2 13.9  HCT 43.3 41.9  MCV 91.7 91.9  PLT 279 144   Basic Metabolic Panel: Recent Labs  Lab 12/12/21 1041 12/13/21 0526  NA 134*  --   K 3.7  --   CL 100  --   CO2 26  --   GLUCOSE 193*  --   BUN 22*  --   CREATININE 0.92 1.03  CALCIUM 9.1  --    GFR: Estimated Creatinine Clearance: 96.2 mL/min (by C-G formula based on SCr of 1.03 mg/dL). Liver Function Tests: Recent Labs  Lab 12/12/21 1041  AST 20  ALT 20  ALKPHOS 88  BILITOT 0.6  PROT 8.0  ALBUMIN  3.9   No results for input(s): "LIPASE", "AMYLASE" in the last 168 hours. No results for input(s): "AMMONIA" in the last 168 hours. Coagulation Profile: Recent Labs  Lab 12/12/21 1041  INR 0.9   Cardiac Enzymes: No results for input(s): "CKTOTAL", "CKMB", "CKMBINDEX", "TROPONINI" in the last 168 hours. BNP (last 3 results) No results for input(s): "PROBNP" in the last 8760 hours. HbA1C: No results for input(s): "HGBA1C" in the last 72 hours. CBG: Recent Labs  Lab 12/12/21 1555 12/12/21 1835 12/12/21 2058 12/13/21 0832 12/13/21 1140  GLUCAP 124* 134* 140* 163* 119*   Lipid Profile: No results for input(s): "CHOL", "HDL",  "LDLCALC", "TRIG", "CHOLHDL", "LDLDIRECT" in the last 72 hours. Thyroid Function Tests: No results for input(s): "TSH", "T4TOTAL", "FREET4", "T3FREE", "THYROIDAB" in the last 72 hours. Anemia Panel: No results for input(s): "VITAMINB12", "FOLATE", "FERRITIN", "TIBC", "IRON", "RETICCTPCT" in the last 72 hours. Urine analysis:    Component Value Date/Time   COLORURINE STRAW (A) 12/12/2021 1041   APPEARANCEUR CLEAR (A) 12/12/2021 1041   LABSPEC 1.015 12/12/2021 1041   PHURINE 6.0 12/12/2021 1041   GLUCOSEU >=500 (A) 12/12/2021 1041   HGBUR SMALL (A) 12/12/2021 1041   BILIRUBINUR NEGATIVE 12/12/2021 1041   KETONESUR NEGATIVE 12/12/2021 1041   PROTEINUR NEGATIVE 12/12/2021 1041   NITRITE NEGATIVE 12/12/2021 [place] 12/12/2021 1041   Recent Results (from the past 240 hour(s))  Culture, blood (Routine x 2)     Status: None (Preliminary result)   Collection Time: 12/12/21 10:41 AM   Specimen: BLOOD  Result Value Ref Range Status   Specimen Description BLOOD RIGHT AC  Final   Special Requests   Final    BOTTLES DRAWN AEROBIC AND ANAEROBIC Blood Culture adequate volume   Culture   Final    NO GROWTH < 24 HOURS Performed at [facility], [address], [place], [place] [address]    Report Status PENDING  Incomplete  Culture, blood (Routine x 2)     Status: None (Preliminary result)   Collection Time: 12/12/21  1:18 PM   Specimen: BLOOD  Result Value Ref Range Status   Specimen Description BLOOD BLOOD RIGHT FOREARM  Final   Special Requests   Final    BOTTLES DRAWN AEROBIC AND ANAEROBIC Blood Culture adequate volume   Culture   Final    NO GROWTH < 24 HOURS Performed at [facility], [address] [place], [place] [address]    Report Status PENDING  Incomplete     MR FOOT LEFT WO CONTRAST  Result Date: 12/12/2021 CLINICAL DATA:  Foot swelling. History of poorly controlled diabetes and history of osteomyelitis. Recurrent diabetic foot  wounds. Osteomyelitis suspected. Based on area of interest hindfoot covered with extended field of view. EXAM: MRI OF THE LEFT FOOT WITHOUT CONTRAST TECHNIQUE: Multiplanar, multisequence MR imaging of the left hindfoot and midfoot was performed. No intravenous contrast was administered. COMPARISON:  left foot radiographs 11/28/2017; MRI left foot without and with contrast 11/29/2017 FINDINGS: TENDONS Peroneal: The peroneus brevis tendon extends towards the resected base of the fifth metatarsal. The peroneus longus tendon appears intact. Posteromedial: Mild posterior tibial tenosynovitis. The flexor digitorum longus and flexor hallucis longus tendons are intact. Anterior: The tibialis anterior, extensor hallucis longus, and extensor digitorum longus tendons are intact. Achilles: Intact. Plantar Fascia: Intact. Edema within the proximal abductor hallucis, flexor digitorum brevis and abductor digiti minimi muscles. LIGAMENTS Lateral: Attenuation of the calcaneofibular ligament. Mild thickening of the anterior talofibular ligament, possibly  the sequela of remote trauma. No fluid bright tear. The anterior and posterior tibiofibular and posterior talofibular ligaments are intact. Medial: The tibiotalar deep deltoid and tibial spring ligaments are intact. CARTILAGE Ankle Joint: Intact cartilage. Subtalar Joints/Sinus Tarsi: Fat is preserved within sinus tarsi. Bones: Interval postsurgical changes of resection of the proximal third of fifth metatarsal where previously there was concern for osteomyelitis on 11/29/2017 MRI. There is ulceration again seen at the plantar lateral aspect of the surgical site. There is decreased T1 and increased T2 linear signal within the overlying skin (axial series 6 images 33-35, coronal series 8, images 14 through 16) that appears to represent an ulcer and sinus tract contacting the plantar lateral aspect of the cuboid. There is moderate marrow edema within the plantar lateral aspect of the  cuboid (coronal series 8, image 18, axial series 6, image 30) with cortical erosion of the plantar lateral cuboid indicating osteomyelitis. There is mild marrow edema within the adjacent posterolateral aspect of the postsurgical remaining portion of fifth metatarsal and more moderate marrow edema within the proximal lateral aspect of the base of fourth metatarsal (axial series 6, image 31 and sagittal series 10, image 15) also concerning for osteomyelitis. There is metallic susceptibility artifact around the region of marrow fat that may represent a prior screw tract within the calcaneus (sagittal series 9, images 15-18, axial series 6, images 24 and 25). Other: Nonspecific plantar and dorsal midfoot edema/myositis. Moderate ankle hindfoot, and midfoot subcutaneous fat edema and swelling. IMPRESSION: Compared to 11/29/2017: Interval resection of the proximal third of fifth metatarsal, an area of osteomyelitis on the prior MRI. There is a persistent ulceration of the plantar lateral aspect of the resection site, with sinus tract extending towards the adjacent cuboid bone. Findings suspicious for osteomyelitis of the plantar lateral aspect of the cuboid and adjacent base of fourth metatarsal and possibly the proximal most aspect of the remaining fifth metatarsal shaft. Electronically Signed   By: Yvonne Kendall M.D.   On: 12/12/2021 17:30   DG Chest 2 View  Result Date: 12/12/2021 CLINICAL DATA:  Suspected sepsis. EXAM: CHEST - 2 VIEW COMPARISON:  Chest radiograph 06/25/2019. FINDINGS: Similar mildly elevated left hemidiaphragm. Similar low lung volumes. No consolidation. No visible pleural effusions or pneumothorax. Cardiomediastinal silhouette is within normal limits and similar to prior. No evidence of acute osseous abnormality. Degenerative changes of the thoracic spine with bridging osteophytes. IMPRESSION: Similar low lung volumes without evidence of acute cardiopulmonary disease. Electronically Signed   By:  Margaretha Sheffield M.D.   On: 12/12/2021 11:10    Family Communication: None at bedside  Disposition: Status is: Inpatient Remains inpatient appropriate because: Needs vascular evaluation, then I&D and IV antibiotics Planned Discharge Destination: Home      Patrecia Pour, MD 12/13/2021 1:04 PM Page by [email or web address]

## 2021-12-13 NOTE — Consult Note (Signed)
ORTHOPAEDIC CONSULTATION  REQUESTING PHYSICIAN: Patrecia Pour, MD  Chief Complaint: Left foot infection  HPI: [name] is a 61 y.o. male who complains of worsening infection to his left foot.  Well-known to me.  He has had a longstanding history of chronic ulcerations.  He has undergone previous debridement of bone and excision of the base of the fifth metatarsal in the past for osteomyelitis.  He has developed a and worsening ulceration to the plantar aspect of his left foot.  In the outpatient clinic yesterday there was a area that probes deep into the arch with dorsal erythema.  This was quite concerning for worsening infection.  He was sent to the ER for admission for further work-up and antibiotics.  Past Medical History:  Diagnosis Date   Arthritis    Bacterial infection due to Morganella morganii 12/26/2017   BPH (benign prostatic hypertrophy)    Diabetes mellitus    Diabetic neuropathy (HCC)    Hypertension    Metatarsal bone fracture    left 5th toe   Osteomyelitis of toe of right foot ([place])    Peripheral vascular disease (HCC)    Post-operative infection    and diabetic ulcer left foot   Sepsis due to Streptococcus, group B ([place]) 12/26/2017   Sleep apnea     does wear CPAP   Wears glasses    Past Surgical History:  Procedure Laterality Date   AMPUTATION TOE Left    small toe   AMPUTATION TOE Right 04/02/2019   Procedure: AMPUTATION TOE 2ND;  Surgeon: Samara Deist, DPM;  Location: [facility];  Service: Podiatry;  Laterality: Right;   COLONOSCOPY W/ BIOPSIES AND POLYPECTOMY     I & D EXTREMITY Left 01/11/2016   Procedure: LEFT FOOT IRRIGATION AND DEBRIDEMENT WOUND VAC AND REMOVAL OF HARDWARE;  Surgeon: Wylene Simmer, MD;  Location: [place];  Service: Orthopedics;  Laterality: Left;   LOWER EXTREMITY ANGIOGRAPHY N/A 12/01/2017   Procedure: Lower Extremity Angiography;  Surgeon: Algernon Huxley, MD;  Location: [place] [facility];  Service: Cardiovascular;  Laterality: N/A;    LOWER EXTREMITY ANGIOGRAPHY Right 12/04/2017   Procedure: Lower Extremity Angiography;  Surgeon: Algernon Huxley, MD;  Location: [place] [facility];  Service: Cardiovascular;  Laterality: Right;   LOWER EXTREMITY ANGIOGRAPHY Left 03/12/2018   Procedure: LOWER EXTREMITY ANGIOGRAPHY;  Surgeon: Algernon Huxley, MD;  Location: [place] [facility];  Service: Cardiovascular;  Laterality: Left;   ORIF TOE FRACTURE Left 06/08/2015   Procedure: OPEN REDUCTION INTERNAL FIXATION (ORIF) LEFT FIFTH METATARSAL BASE FRACTURE NONUNION; CALCANEAL AUTOGRAFT ;  Surgeon: Wylene Simmer, MD;  Location: [place];  Service: Orthopedics;  Laterality: Left;   PATELLA RECONSTRUCTION Left 2005   PROSTATE ABLATION  2014   TOE AMPUTATION     partial amputation right great toe   WOUND DEBRIDEMENT Bilateral 11/30/2017   Procedure: DEBRIDEMENT WOUND;  Surgeon: Sharlotte Alamo, DPM;  Location: [facility];  Service: Podiatry;  Laterality: Bilateral;   Social History   Socioeconomic History   Marital status: Married    Spouse name: [name]   Number of children: 2   Years of education: Not on file   Highest education level: Not on file  Occupational History   Not on file  Tobacco Use   Smoking status: Never   Smokeless tobacco: Never  Vaping Use   Vaping Use: Never used  Substance and Sexual Activity   Alcohol use: Yes    Comment: occasional  Drug use: Yes    Types: Marijuana, Cocaine    Comment: last month   Sexual activity: Not on file  Other Topics Concern   Not on file  Social History Narrative   Lives with wife. Children all grown up.   Social Determinants of Health   Financial Resource Strain: Low Risk  (04/01/2019)   Overall Financial Resource Strain (CARDIA)    Difficulty of Paying Living Expenses: Not very hard  Food Insecurity: No Food Insecurity (04/01/2019)   Hunger Vital Sign    Worried About Running Out of Food in the Last Year: Never true    Ran Out of Food in the Last Year: Never true   Transportation Needs: No Transportation Needs (04/01/2019)   PRAPARE - Hydrologist (Medical): No    Lack of Transportation (Non-Medical): No  Physical Activity: Insufficiently Active (04/01/2019)   Exercise Vital Sign    Days of Exercise per Week: 1 day    Minutes of Exercise per Session: 60 min  Stress: No Stress Concern Present (04/01/2019)   [place]    Feeling of Stress : Not at all  Social Connections: [place] (04/01/2019)   Social Connection and Isolation Panel [NHANES]    Frequency of Communication with Friends and Family: More than three times a week    Frequency of Social Gatherings with Friends and Family: More than three times a week    Attends Religious Services: More than 4 times per year    Active Member of [employer] or Organizations: Yes    Attends Music therapist: More than 4 times per year    Marital Status: Married   Family History  Problem Relation Age of Onset   Diabetes Mother    Diabetes Other    Allergies  Allergen Reactions   Sulfamethoxazole-Trimethoprim Hives and Rash   Prior to Admission medications   Medication Sig Start Date End Date Taking? Authorizing Provider  acetaminophen (TYLENOL) 325 MG tablet Take 2 tablets (650 mg total) by mouth every 6 (six) hours as needed for mild pain (or Fever >/= 101). 04/04/19  Yes Gouru, Aruna, MD  amLODipine (NORVASC) 10 MG tablet Take by mouth. 01/04/20  Yes [provider]  aspirin EC 81 MG tablet Take 81 mg by mouth daily.   Yes [provider]  chlorthalidone (HYGROTON) 25 MG tablet Take 25 mg by mouth daily. 05/08/20  Yes [provider]  doxycycline (VIBRA-TABS) 100 MG tablet Take 100 mg by mouth 2 (two) times daily. 12/10/21  Yes [provider]  JARDIANCE 10 MG TABS tablet Take 10 mg by mouth daily. 05/08/20  Yes [provider]  LANTUS SOLOSTAR 100  UNIT/ML Solostar Pen Inject 45 Units into the skin at bedtime. 07/06/20  Yes [provider]  losartan (COZAAR) 100 MG tablet Take 100 mg by mouth daily.    Yes [provider]  metFORMIN (GLUCOPHAGE) 1000 MG tablet TAKE 1 TABLET TWICE DAILY FOR DIABETES 05/02/20  Yes [provider]  Multiple Vitamin (MULTIVITAMIN) capsule Take 1 capsule by mouth daily.   Yes [provider]  simvastatin (ZOCOR) 20 MG tablet Take by mouth. 12/17/19  Yes [provider]  traMADol (ULTRAM) 50 MG tablet Take 50-100 mg by mouth every 4 (four) hours as needed. 11/16/21  Yes [provider]  TRUEplus Lancets 33G [place]  02/22/20   [provider]   MR FOOT LEFT WO CONTRAST  Result Date: 12/12/2021 CLINICAL DATA:  Foot swelling. History of poorly controlled diabetes and history of osteomyelitis. Recurrent diabetic foot wounds. Osteomyelitis suspected. Based on area of interest hindfoot covered with extended field of view. EXAM: MRI OF THE LEFT FOOT WITHOUT CONTRAST TECHNIQUE: Multiplanar, multisequence MR imaging of the left hindfoot and midfoot was performed. No intravenous contrast was administered. COMPARISON:  left foot radiographs 11/28/2017; MRI left foot without and with contrast 11/29/2017 FINDINGS: TENDONS Peroneal: The peroneus brevis tendon extends towards the resected base of the fifth metatarsal. The peroneus longus tendon appears intact. Posteromedial: Mild posterior tibial tenosynovitis. The flexor digitorum longus and flexor hallucis longus tendons are intact. Anterior: The tibialis anterior, extensor hallucis longus, and extensor digitorum longus tendons are intact. Achilles: Intact. Plantar Fascia: Intact. Edema within the proximal abductor hallucis, flexor digitorum brevis and abductor digiti minimi muscles. LIGAMENTS Lateral: Attenuation of the calcaneofibular ligament. Mild thickening of the anterior talofibular ligament, possibly the sequela of remote  trauma. No fluid bright tear. The anterior and posterior tibiofibular and posterior talofibular ligaments are intact. Medial: The tibiotalar deep deltoid and tibial spring ligaments are intact. CARTILAGE Ankle Joint: Intact cartilage. Subtalar Joints/Sinus Tarsi: Fat is preserved within sinus tarsi. Bones: Interval postsurgical changes of resection of the proximal third of fifth metatarsal where previously there was concern for osteomyelitis on 11/29/2017 MRI. There is ulceration again seen at the plantar lateral aspect of the surgical site. There is decreased T1 and increased T2 linear signal within the overlying skin (axial series 6 images 33-35, coronal series 8, images 14 through 16) that appears to represent an ulcer and sinus tract contacting the plantar lateral aspect of the cuboid. There is moderate marrow edema within the plantar lateral aspect of the cuboid (coronal series 8, image 18, axial series 6, image 30) with cortical erosion of the plantar lateral cuboid indicating osteomyelitis. There is mild marrow edema within the adjacent posterolateral aspect of the postsurgical remaining portion of fifth metatarsal and more moderate marrow edema within the proximal lateral aspect of the base of fourth metatarsal (axial series 6, image 31 and sagittal series 10, image 15) also concerning for osteomyelitis. There is metallic susceptibility artifact around the region of marrow fat that may represent a prior screw tract within the calcaneus (sagittal series 9, images 15-18, axial series 6, images 24 and 25). Other: Nonspecific plantar and dorsal midfoot edema/myositis. Moderate ankle hindfoot, and midfoot subcutaneous fat edema and swelling. IMPRESSION: Compared to 11/29/2017: Interval resection of the proximal third of fifth metatarsal, an area of osteomyelitis on the prior MRI. There is a persistent ulceration of the plantar lateral aspect of the resection site, with sinus tract extending towards the adjacent  cuboid bone. Findings suspicious for osteomyelitis of the plantar lateral aspect of the cuboid and adjacent base of fourth metatarsal and possibly the proximal most aspect of the remaining fifth metatarsal shaft. Electronically Signed   By: Yvonne Kendall M.D.   On: 12/12/2021 17:30   DG Chest 2 View  Result Date: 12/12/2021 CLINICAL DATA:  Suspected sepsis. EXAM: CHEST - 2 VIEW COMPARISON:  Chest radiograph 06/25/2019. FINDINGS: Similar mildly elevated left hemidiaphragm. Similar low lung volumes. No consolidation. No visible pleural effusions or pneumothorax. Cardiomediastinal silhouette is within normal limits and similar to prior. No evidence of acute osseous abnormality. Degenerative changes of the thoracic spine with bridging osteophytes. IMPRESSION: Similar low lung volumes without evidence of acute cardiopulmonary disease. Electronically Signed   By: Margaretha Sheffield M.D.   On: 12/12/2021 11:10  Positive ROS: All other systems have been reviewed and were otherwise negative with the exception of those mentioned in the HPI and as above.  12 point ROS was performed.  Physical Exam: General: Alert and oriented.  No apparent distress.  Vascular:  Left foot:Dorsalis Pedis:  diminished Posterior Tibial:  absent  Right foot: Dorsalis Pedis:  diminished Posterior Tibial:  absent  Neuro:absent protective sensation  Derm: The plantar aspect of the left foot is noted full-thickness ulceration.  There is an area in the very central aspect that probes deep all the way into the medial arch region.  There is also an area of erythema dorsally around the dorsal lateral aspect of the midfoot region.  See clinical picture.  Ortho/MS: He status post debridement of bone to his left lateral foot.  He has noted edema to the left foot.     Assessment: Diabetes with neuropathy Osteomyelitis left foot  Plan: MRI was consistent with osteomyelitis of the lateral aspect of the left foot along the cuboid  region and concern for osteomyelitis in the base of the fourth metatarsal.  He has a noted probing wound to the plantar aspect of his left foot concerning for an abscessed area.  I have discussed with the patient the need to undergo I&D of the foot with open debridement of part of the bone.  We will likely have to apply antibiotic bead into the area.  He will need longstanding IV antibiotics and have consulted infectious disease.  Plan will be for surgery tomorrow.  Consent has been given.  I discussed the risk benefits alternatives and complications associated with surgery today.    Elesa Hacker, DPM Cell [phone]   12/13/2021 12:30 PM

## 2021-12-14 ENCOUNTER — Encounter
Admission: EM | Disposition: A | Discharge status: Home Health | Payer: Self-pay | Source: Home / Self Care | Attending: Internal Medicine

## 2021-12-14 ENCOUNTER — Other Ambulatory Visit: Payer: Self-pay

## 2021-12-14 ENCOUNTER — Inpatient Hospital Stay: Payer: 59 | Admitting: Anesthesiology

## 2021-12-14 DIAGNOSIS — I10 Essential (primary) hypertension: Secondary | ICD-10-CM | POA: Diagnosis not present

## 2021-12-14 DIAGNOSIS — G4733 Obstructive sleep apnea (adult) (pediatric): Secondary | ICD-10-CM | POA: Diagnosis not present

## 2021-12-14 DIAGNOSIS — E785 Hyperlipidemia, unspecified: Secondary | ICD-10-CM | POA: Diagnosis not present

## 2021-12-14 DIAGNOSIS — E11628 Type 2 diabetes mellitus with other skin complications: Secondary | ICD-10-CM | POA: Diagnosis not present

## 2021-12-14 HISTORY — PX: IRRIGATION AND DEBRIDEMENT FOOT: SHX6602

## 2021-12-14 LAB — CREATININE, SERUM
Creatinine, Ser: 1.09 mg/dL (ref 0.61–1.24)
GFR, Estimated: >60 mL/min (ref >60)

## 2021-12-14 LAB — GLUCOSE, CAPILLARY
Glucose-Capillary: 121 mg/dL — ABNORMAL HIGH (ref 70–99)
Glucose-Capillary: 95 mg/dL (ref 70–99)
Glucose-Capillary: 85 mg/dL (ref 70–99)
Glucose-Capillary: 170 mg/dL — ABNORMAL HIGH (ref 70–99)
Glucose-Capillary: 218 mg/dL — ABNORMAL HIGH (ref 70–99)

## 2021-12-14 SURGERY — IRRIGATION AND DEBRIDEMENT FOOT
Anesthesia: General | Site: Foot | Laterality: Left

## 2021-12-14 MED ORDER — 0.9 % SODIUM CHLORIDE (POUR BTL) OPTIME
TOPICAL | Status: DC | PRN
  Administered 2021-12-14 (×2): 500 mL

## 2021-12-14 MED ORDER — BUPIVACAINE HCL (PF) 0.5 % IJ SOLN
INTRAMUSCULAR | Status: AC
  Filled 2021-12-14: qty 30

## 2021-12-14 MED ORDER — ONDANSETRON HCL 4 MG/2ML IJ SOLN
INTRAMUSCULAR | Status: DC | PRN
  Administered 2021-12-14: 4 mg via INTRAVENOUS

## 2021-12-14 MED ORDER — ONDANSETRON HCL 4 MG/2ML IJ SOLN
INTRAMUSCULAR | Status: AC
  Filled 2021-12-14: qty 2

## 2021-12-14 MED ORDER — PROPOFOL 10 MG/ML IV BOLUS
INTRAVENOUS | Status: DC | PRN
  Administered 2021-12-14: 180 mg via INTRAVENOUS

## 2021-12-14 MED ORDER — DEXAMETHASONE SODIUM PHOSPHATE 10 MG/ML IJ SOLN
INTRAMUSCULAR | Status: AC
Start: 2021-12-14 — End: ?
  Filled 2021-12-14: qty 1

## 2021-12-14 MED ORDER — KETAMINE HCL 50 MG/5ML IJ SOSY
PREFILLED_SYRINGE | INTRAMUSCULAR | Status: AC
  Filled 2021-12-14: qty 5

## 2021-12-14 MED ORDER — ONDANSETRON HCL 4 MG/2ML IJ SOLN
INTRAMUSCULAR | Status: AC
Start: 2021-12-14 — End: ?
  Filled 2021-12-14: qty 2

## 2021-12-14 MED ORDER — VANCOMYCIN HCL 1000 MG IV SOLR
INTRAVENOUS | Status: AC
Start: 2021-12-14 — End: ?
  Filled 2021-12-14: qty 20

## 2021-12-14 MED ORDER — LIDOCAINE HCL (PF) 1 % IJ SOLN
INTRAMUSCULAR | Status: AC
  Filled 2021-12-14: qty 30

## 2021-12-14 MED ORDER — FENTANYL CITRATE (PF) 100 MCG/2ML IJ SOLN
INTRAMUSCULAR | Status: AC
  Filled 2021-12-14: qty 2

## 2021-12-14 MED ORDER — BUPIVACAINE HCL 0.5 % IJ SOLN
INTRAMUSCULAR | Status: DC | PRN
  Administered 2021-12-14: 10 mL

## 2021-12-14 MED ORDER — VANCOMYCIN HCL 1000 MG IV SOLR
INTRAVENOUS | Status: DC | PRN
  Administered 2021-12-14: 1000 mg

## 2021-12-14 MED ORDER — FENTANYL CITRATE (PF) 100 MCG/2ML IJ SOLN: 25.0000 ug | INTRAMUSCULAR | Status: DC | PRN

## 2021-12-14 MED ORDER — MIDAZOLAM HCL 2 MG/2ML IJ SOLN
INTRAMUSCULAR | Status: DC | PRN
  Administered 2021-12-14: 2 mg via INTRAVENOUS

## 2021-12-14 MED ORDER — KETAMINE HCL 10 MG/ML IJ SOLN
INTRAMUSCULAR | Status: DC | PRN
  Administered 2021-12-14 (×2): 10 mg via INTRAVENOUS
  Administered 2021-12-14: 30 mg via INTRAVENOUS

## 2021-12-14 MED ORDER — LIDOCAINE HCL (PF) 2 % IJ SOLN
INTRAMUSCULAR | Status: AC
  Filled 2021-12-14: qty 5

## 2021-12-14 MED ORDER — SODIUM CHLORIDE 0.9 % IV SOLN
INTRAVENOUS | Status: AC | PRN
  Administered 2021-12-14: 1000 mL via INTRAMUSCULAR

## 2021-12-14 MED ORDER — DEXAMETHASONE SODIUM PHOSPHATE 10 MG/ML IJ SOLN
INTRAMUSCULAR | Status: DC | PRN
  Administered 2021-12-14: 5 mg via INTRAVENOUS

## 2021-12-14 MED ORDER — GENTAMICIN SULFATE 40 MG/ML IJ SOLN
INTRAMUSCULAR | Status: AC
Start: 2021-12-14 — End: ?
  Filled 2021-12-14: qty 2

## 2021-12-14 MED ORDER — ONDANSETRON HCL 4 MG/2ML IJ SOLN: 4.0000 mg | Freq: Once | INTRAMUSCULAR | Status: DC | PRN

## 2021-12-14 MED ORDER — PROPOFOL 10 MG/ML IV BOLUS
INTRAVENOUS | Status: AC
  Filled 2021-12-14: qty 20

## 2021-12-14 MED ORDER — MIDAZOLAM HCL 2 MG/2ML IJ SOLN
INTRAMUSCULAR | Status: AC
  Filled 2021-12-14: qty 2

## 2021-12-14 MED ORDER — GENTAMICIN SULFATE 40 MG/ML IJ SOLN
INTRAMUSCULAR | Status: DC | PRN
  Administered 2021-12-14: 80 mg

## 2021-12-14 MED ORDER — FENTANYL CITRATE (PF) 100 MCG/2ML IJ SOLN
INTRAMUSCULAR | Status: DC | PRN
  Administered 2021-12-14 (×2): 25 ug via INTRAVENOUS

## 2021-12-14 MED ORDER — LIDOCAINE HCL (CARDIAC) PF 100 MG/5ML IV SOSY
PREFILLED_SYRINGE | INTRAVENOUS | Status: DC | PRN
  Administered 2021-12-14: 100 mg via INTRAVENOUS

## 2021-12-14 MED ORDER — ACETAMINOPHEN 10 MG/ML IV SOLN
INTRAVENOUS | Status: DC | PRN
  Administered 2021-12-14: 1000 mg via INTRAVENOUS

## 2021-12-14 MED ORDER — SODIUM CHLORIDE 0.9 % IV SOLN: INTRAVENOUS | Status: DC | PRN

## 2021-12-14 MED ORDER — LIDOCAINE-EPINEPHRINE 1 %-1:100000 IJ SOLN
INTRAMUSCULAR | Status: AC
Start: 2021-12-14 — End: ?
  Filled 2021-12-14: qty 1

## 2021-12-14 MED ORDER — LIDOCAINE-EPINEPHRINE 1 %-1:100000 IJ SOLN
INTRAMUSCULAR | Status: DC | PRN
  Administered 2021-12-14: 10 mL

## 2021-12-14 MED ORDER — ACETAMINOPHEN 10 MG/ML IV SOLN
INTRAVENOUS | Status: AC
  Filled 2021-12-14: qty 100

## 2021-12-14 MED ORDER — DEXAMETHASONE SODIUM PHOSPHATE 10 MG/ML IJ SOLN
INTRAMUSCULAR | Status: AC
  Filled 2021-12-14: qty 1

## 2021-12-14 SURGICAL SUPPLY — 64 items
BLADE OSC/SAGITTAL MD 5.5X18 (BLADE) IMPLANT
BLADE OSCILLATING/SAGITTAL (BLADE)
BLADE SW THK.38XMED LNG THN (BLADE) IMPLANT
BNDG COHESIVE 4X5 TAN ST LF (GAUZE/BANDAGES/DRESSINGS) ×2 IMPLANT
BNDG COHESIVE 6X5 TAN ST LF (GAUZE/BANDAGES/DRESSINGS) ×2 IMPLANT
BNDG CONFORM 3 STRL LF (GAUZE/BANDAGES/DRESSINGS) ×2 IMPLANT
BNDG ELASTIC 4X5.8 VLCR STR LF (GAUZE/BANDAGES/DRESSINGS) ×2 IMPLANT
BNDG ESMARK 4X12 TAN STRL LF (GAUZE/BANDAGES/DRESSINGS) ×2 IMPLANT
BNDG GAUZE DERMACEA FLUFF (GAUZE/BANDAGES/DRESSINGS) ×1
BNDG GAUZE DERMACEA FLUFF 4 (GAUZE/BANDAGES/DRESSINGS) ×1 IMPLANT
CUFF TOURN SGL QUICK 12 (TOURNIQUET CUFF) IMPLANT
CUFF TOURN SGL QUICK 18X4 (TOURNIQUET CUFF) ×1 IMPLANT
DRAPE FLUOR MINI C-ARM 54X84 (DRAPES) IMPLANT
DRAPE XRAY CASSETTE 23X24 (DRAPES) IMPLANT
DRSG MEPILEX FLEX 3X3 (GAUZE/BANDAGES/DRESSINGS) IMPLANT
DURAPREP 26ML APPLICATOR (WOUND CARE) ×2 IMPLANT
ELECT REM PT RETURN 9FT ADLT (ELECTROSURGICAL) ×2
ELECTRODE REM PT RTRN 9FT ADLT (ELECTROSURGICAL) ×1 IMPLANT
GAUZE PACKING 1/4 X5 YD (GAUZE/BANDAGES/DRESSINGS) ×2 IMPLANT
GAUZE PACKING IODOFORM 1X5 (PACKING) ×2 IMPLANT
GAUZE SPONGE 4X4 12PLY STRL (GAUZE/BANDAGES/DRESSINGS) ×2 IMPLANT
GAUZE STRETCH 2X75IN STRL (MISCELLANEOUS) ×2 IMPLANT
GAUZE XEROFORM 1X8 LF (GAUZE/BANDAGES/DRESSINGS) ×2 IMPLANT
GLOVE BIO SURGEON STRL SZ7.5 (GLOVE) ×2 IMPLANT
GLOVE SURG UNDER LTX SZ8 (GLOVE) ×2 IMPLANT
GOWN STRL REUS W/ TWL XL LVL3 (GOWN DISPOSABLE) ×2 IMPLANT
GOWN STRL REUS W/TWL MED LVL3 (GOWN DISPOSABLE) ×4 IMPLANT
GOWN STRL REUS W/TWL XL LVL3 (GOWN DISPOSABLE) ×2
HANDPIECE VERSAJET DEBRIDEMENT (MISCELLANEOUS) ×1 IMPLANT
IV NS 1000ML (IV SOLUTION) ×1
IV NS 1000ML BAXH (IV SOLUTION) ×1 IMPLANT
IV NS IRRIG 3000ML ARTHROMATIC (IV SOLUTION) ×2 IMPLANT
KIT STIMULAN RAPID CURE 5CC (Orthopedic Implant) ×1 IMPLANT
KIT TURNOVER KIT A (KITS) ×2 IMPLANT
LABEL OR SOLS (LABEL) ×2 IMPLANT
MANIFOLD NEPTUNE II (INSTRUMENTS) ×2 IMPLANT
NDL BIOPSY JAMSHIDI 11X6 (NEEDLE) IMPLANT
NDL FILTER BLUNT 18X1 1/2 (NEEDLE) ×1 IMPLANT
NDL HYPO 25X1 1.5 SAFETY (NEEDLE) ×1 IMPLANT
NEEDLE BIOPSY JAMSHIDI 11X6 (NEEDLE) ×2 IMPLANT
NEEDLE FILTER BLUNT 18X 1/2SAF (NEEDLE) ×1
NEEDLE FILTER BLUNT 18X1 1/2 (NEEDLE) ×1 IMPLANT
NEEDLE HYPO 25X1 1.5 SAFETY (NEEDLE) ×2 IMPLANT
NS IRRIG 500ML POUR BTL (IV SOLUTION) ×2 IMPLANT
PACK EXTREMITY ARMC (MISCELLANEOUS) ×2 IMPLANT
PAD ABD DERMACEA PRESS 5X9 (GAUZE/BANDAGES/DRESSINGS) ×2 IMPLANT
PULSAVAC PLUS IRRIG FAN TIP (DISPOSABLE)
RASP SM TEAR CROSS CUT (RASP) ×1 IMPLANT
SHIELD FULL FACE ANTIFOG 7M (MISCELLANEOUS) ×2 IMPLANT
SOL PREP PVP 2OZ (MISCELLANEOUS) ×2
SOLUTION PREP PVP 2OZ (MISCELLANEOUS) ×1 IMPLANT
STOCKINETTE IMPERVIOUS 9X36 MD (GAUZE/BANDAGES/DRESSINGS) ×2 IMPLANT
SUT ETHILON 2 0 FS 18 (SUTURE) ×4 IMPLANT
SUT ETHILON 4-0 (SUTURE)
SUT ETHILON 4-0 FS2 18XMFL BLK (SUTURE)
SUT VIC AB 3-0 SH 27 (SUTURE)
SUT VIC AB 3-0 SH 27X BRD (SUTURE) ×1 IMPLANT
SUT VIC AB 4-0 FS2 27 (SUTURE) ×1 IMPLANT
SUTURE ETHLN 4-0 FS2 18XMF BLK (SUTURE) ×1 IMPLANT
SWAB CULTURE AMIES ANAERIB BLU (MISCELLANEOUS) IMPLANT
SYR 10ML LL (SYRINGE) ×4 IMPLANT
Stryker Small Tear Cross Cut Rasp ×1 IMPLANT
TIP FAN IRRIG PULSAVAC PLUS (DISPOSABLE) ×1 IMPLANT
WATER STERILE IRR 500ML POUR (IV SOLUTION) ×2 IMPLANT

## 2021-12-14 NOTE — Progress Notes (Signed)
Pt home machine inspected and no leaks or tears found. Machine appears to be clean and in working order when turned on. Water chamber refilled. Pt states he is able to manage machine independently and will place it on himself when he is ready to sleep. Pt instructed to notify RN if he has questions or concerns about his home machine.

## 2021-12-14 NOTE — Consult Note (Signed)
Pharmacy Antibiotic Note  BRANDIN STETZER is a 61 y.o. male admitted on 12/12/2021 with a diabetic foot infection. Pharmacy has been consulted for vancomycin and Zosyn dosing.  Plan:  1) continue vancomycin 1000 mg IV q12h  Goal AUC 400-550  Est AUC: 533.1 Est Cmax: 32.2 Est Cmin: 15.5 Ke 0.066 h-1, T1/2 10.5 h Daily serum creatinine while on IV vancomycin Calculated with SCr 1.09 mg/dL, Vd 0.5 (BMI 35)  2) continue Zosyn 3.375 gm IV q8h (4 hour infusion)  Monitor clinical picture, renal function, and vancomycin levels at steady state F/U C&S, abx deescalation / LOT   Height: '5\' 10"'$  (177.8 cm) Weight: 113.4 kg (250 lb) IBW/kg (Calculated) : 73  Temp (24hrs), Avg:98.3 F (36.8 C), Min:98.1 F (36.7 C), Max:98.4 F (36.9 C)  Recent Labs  Lab 12/12/21 1041 12/12/21 1317 12/13/21 0526 12/14/21 0602  WBC 7.5  --  7.4  --   CREATININE 0.92  --  1.03 1.09  LATICACIDVEN 1.8 1.7  --   --      Estimated Creatinine Clearance: 90.9 mL/min (by C-G formula based on SCr of 1.09 mg/dL).    Allergies  Allergen Reactions   Sulfamethoxazole-Trimethoprim Hives and Rash    Antimicrobials this admission: 6/14 vancomycin >>  6/14 Zosyn >>   Microbiology results: 6/14 BCx: NG x 2 days  of this patient's care.  Dallie Piles, PharmD 12/14/2021 7:47 AM

## 2021-12-14 NOTE — Anesthesia Postprocedure Evaluation (Signed)
Anesthesia Post Note  Patient: [name]  Procedure(s) Performed: IRRIGATION AND DEBRIDEMENT FOOT (Left: Foot)  Patient location during evaluation: PACU Anesthesia Type: General Level of consciousness: awake and awake and alert Pain management: satisfactory to patient Respiratory status: spontaneous breathing and respiratory function stable Cardiovascular status: stable Anesthetic complications: no   No notable events documented.   Last Vitals:  Vitals:   12/14/21 1408 12/14/21 1430  BP: (!) 158/87 (!) 161/87  Pulse:  84  Resp: (!) 22 (!) 23  Temp:  (!) 36.3 C  SpO2:  100%    Last Pain:  Vitals:   12/14/21 1430  TempSrc:   PainSc: 0-No pain                 VAN STAVEREN,Deniqua Perry

## 2021-12-14 NOTE — Op Note (Addendum)
Operative note   Surgeon:Cooper Moroney Lawyer: None    Preop diagnosis: Osteomyelitis left foot with superficial abscess    Postop diagnosis: Same    Procedure: 1.  Debridement down to and including bone of the cuboid and fourth metatarsal region left foot. 2.  Bone biopsy cuboid for culture and pathology 3.  Placement of stimulan antibiotic beads    EBL: Minimal    Anesthesia:local and general.  Local consisted of a total of 20 cc of a one-to-one mixture of 0.5% plain bupivacaine and 1% lidocaine with epinephrine    Hemostasis: Lidocaine with epinephrine    Specimen: Bone for culture as well as bone for pathology    Complications: None    Operative indications:[name] is an 61 y.o. that presents today for surgical intervention.  The risks/benefits/alternatives/complications have been discussed and consent has been given.    Procedure:  Patient was brought into the OR and placed on the operating table in thesupine position. After anesthesia was obtained theleft lower extremity was prepped and draped in usual sterile fashion.  Attention was directed to the left foot where a large full-thickness ulcer was noted at this time.Ulcer measured 25. X 2.5 cm with depth of 2.0 cm  This was into the plantar midfoot laterally under the cuboid region.  There was an area that probes deep into the medial arch.  At this time an incision was carried from the lateral aspect of the foot to the mid arch region.  Sharp and blunt dissection carried down deep to the fascial region.  At this time a small amount of purulent drainage was noted but not severe in nature.  The bone was not evaluated at this time.  There was noted to be small focal exostosis diffusely throughout the plantar lateral foot at the level of the ulceration.  At this time I elected to perform bone biopsy of the cuboid region.  A Jamshidi needle was used to remove 3 biopsies.  A portion was sent for bone culture and a another  portion was sent for pathological examination.  Next the bone was then excised and debrided and smoothed with a power rasp and rongeur and scissors and versajet.  Pste debridement ulcer measured 2.5 x 2.5 x 2.0 cm.  The incision was carried more medial and dorsal.  The dorsal aspect of the foot had a small superficial abscessed area that was opened and drained.  Minimal purulence was noted in this region as well.  The wound was then flushed with copious amounts of irrigation.  Finally antibiotic impregnated stimulant bone beads were placed with gentamicin and vancomycin impregnated in the calcium sulfate.  The wound was then closed with a 2-0 nylon.  A large bulky sterile dressing was applied.    Patient tolerated the procedure and anesthesia well.  Was transported from the OR to the PACU with all vital signs stable and vascular status intact. To be discharged per routine protocol.  Will follow up in approximately 1 week in the outpatient clinic.

## 2021-12-14 NOTE — Transfer of Care (Signed)
Immediate Anesthesia Transfer of Care Note  Patient: [name]  Procedure(s) Performed: IRRIGATION AND DEBRIDEMENT FOOT (Left: Foot)  Patient Location: PACU  Anesthesia Type:General  Level of Consciousness: drowsy and patient cooperative  Airway & Oxygen Therapy: Patient Spontanous Breathing and Patient connected to face mask oxygen  Post-op Assessment: Report given to RN and Post -op Vital signs reviewed and stable  Post vital signs: Reviewed and stable  Last Vitals:  Vitals Value Taken Time  BP 164/96 12/14/21 1402  Temp    Pulse 89 12/14/21 1406  Resp 24 12/14/21 1406  SpO2 100 % 12/14/21 1406  Vitals shown include unvalidated device data.  Last Pain:  Vitals:   12/14/21 1136  TempSrc: Temporal  PainSc: 7          Complications: No notable events documented.

## 2021-12-14 NOTE — Consult Note (Signed)
[facility] Consult Note  MRN : [id number]  [name] is a 61 y.o. (February 05, 1961) male who presents with chief complaint of  Chief Complaint  Patient presents with   Foot Pain  .  History of Present Illness: I am asked to see the patient by Dr. Bonner Puna for evaluation of a patient with known peripheral arterial disease and nonhealing ulceration of the left foot with osteomyelitis.  He was taken to the operating room today where debridement was performed by podiatry.  He had a preoperative ABI which was somewhat reduced to 0.83 on the left.  This is the leg he had his most recent angiogram on back in 2019.  He actually has had angiograms of both lower extremities in the past.  Patient reports pain in his left foot that is somewhat worse after surgery.  Has had some bloody drainage on the bandage as well.  No current right leg wounds.  Current Facility-Administered Medications  Medication Dose Route Frequency Provider Last Rate Last Admin   acetaminophen (TYLENOL) tablet 650 mg  650 mg Oral Q6H PRN Samara Deist, DPM       amLODipine (NORVASC) tablet 10 mg  10 mg Oral Daily Samara Deist, DPM   10 mg at 12/14/21 1518   aspirin EC tablet 81 mg  81 mg Oral Daily Samara Deist, DPM   81 mg at 12/14/21 1518   chlorthalidone (HYGROTON) tablet 25 mg  25 mg Oral Daily Samara Deist, DPM       heparin injection 5,000 Units  5,000 Units Subcutaneous Q8H Samara Deist, DPM   5,000 Units at 12/13/21 2144   hydrALAZINE (APRESOLINE) injection 5 mg  5 mg Intravenous Q2H PRN Samara Deist, DPM       insulin aspart (novoLOG) injection 0-5 Units  0-5 Units Subcutaneous QHS Samara Deist, DPM   2 Units at 12/13/21 2143   insulin aspart (novoLOG) injection 0-9 Units  0-9 Units Subcutaneous TID WC Samara Deist, DPM   1 Units at 12/14/21 0823   insulin glargine-yfgn (SEMGLEE) injection 30 Units  30 Units Subcutaneous QHS Samara Deist, DPM   30 Units at 12/13/21 2143    losartan (COZAAR) tablet 100 mg  100 mg Oral Daily Samara Deist, DPM   100 mg at 12/14/21 1518   morphine (PF) 4 MG/ML injection 4 mg  4 mg Intravenous Q3H PRN Samara Deist, DPM   4 mg at 12/12/21 1330   ondansetron (ZOFRAN) injection 4 mg  4 mg Intravenous Q8H PRN Samara Deist, DPM       oxyCODONE-acetaminophen (PERCOCET/ROXICET) 5-325 MG per tablet 1 tablet  1 tablet Oral Q4H PRN Samara Deist, DPM   1 tablet at 12/13/21 1507   piperacillin-tazobactam (ZOSYN) IVPB 3.375 g  3.375 g Intravenous Q8H Samara Deist, DPM 12.5 mL/hr at 12/14/21 1520 3.375 g at 12/14/21 1520   simvastatin (ZOCOR) tablet 20 mg  20 mg Oral q1800 Samara Deist, DPM   20 mg at 12/13/21 1748   Facility-Administered Medications Ordered in Other Encounters  Medication Dose Route Frequency Provider Last Rate Last Admin   Tdap (BOOSTRIX) injection 0.5 mL  0.5 mL Intramuscular Once Betancourt, Aura Fey, NP        Past Medical History:  Diagnosis Date   Arthritis    Bacterial infection due to Morganella morganii 12/26/2017   BPH (benign prostatic hypertrophy)    Diabetes mellitus    Diabetic neuropathy ([place])    Hypertension    Metatarsal bone fracture  left 5th toe   Osteomyelitis of toe of right foot ([facility])    Peripheral vascular disease ([facility])    Post-operative infection    and diabetic ulcer left foot   Sepsis due to Streptococcus, group B ([place]) 12/26/2017   Sleep apnea     does wear CPAP   Wears glasses     Past Surgical History:  Procedure Laterality Date   AMPUTATION TOE Left    small toe   AMPUTATION TOE Right 04/02/2019   Procedure: AMPUTATION TOE 2ND;  Surgeon: Samara Deist, DPM;  Location: [facility];  Service: Podiatry;  Laterality: Right;   COLONOSCOPY W/ BIOPSIES AND POLYPECTOMY     I & D EXTREMITY Left 01/11/2016   Procedure: LEFT FOOT IRRIGATION AND DEBRIDEMENT WOUND VAC AND REMOVAL OF HARDWARE;  Surgeon: Wylene Simmer, MD;  Location: [facility];  Service: Orthopedics;  Laterality: Left;   LOWER  EXTREMITY ANGIOGRAPHY N/A 12/01/2017   Procedure: Lower Extremity Angiography;  Surgeon: Algernon Huxley, MD;  Location: [facility];  Service: Cardiovascular;  Laterality: N/A;   LOWER EXTREMITY ANGIOGRAPHY Right 12/04/2017   Procedure: Lower Extremity Angiography;  Surgeon: Algernon Huxley, MD;  Location: [facility];  Service: Cardiovascular;  Laterality: Right;   LOWER EXTREMITY ANGIOGRAPHY Left 03/12/2018   Procedure: LOWER EXTREMITY ANGIOGRAPHY;  Surgeon: Algernon Huxley, MD;  Location: [facility];  Service: Cardiovascular;  Laterality: Left;   ORIF TOE FRACTURE Left 06/08/2015   Procedure: OPEN REDUCTION INTERNAL FIXATION (ORIF) LEFT FIFTH METATARSAL BASE FRACTURE NONUNION; CALCANEAL AUTOGRAFT ;  Surgeon: Wylene Simmer, MD;  Location: [facility];  Service: Orthopedics;  Laterality: Left;   PATELLA RECONSTRUCTION Left 2005   PROSTATE ABLATION  2014   TOE AMPUTATION     partial amputation right great toe   WOUND DEBRIDEMENT Bilateral 11/30/2017   Procedure: DEBRIDEMENT WOUND;  Surgeon: Sharlotte Alamo, DPM;  Location: [facility];  Service: Podiatry;  Laterality: Bilateral;     Social History   Tobacco Use   Smoking status: Never   Smokeless tobacco: Never  Vaping Use   Vaping Use: Never used  Substance Use Topics   Alcohol use: Yes    Comment: occasional    Drug use: Yes    Types: Marijuana, Cocaine    Comment: last month     Family History  Problem Relation Age of Onset   Diabetes Mother    Diabetes Other   No bleeding disorders or clotting disorders.  No aneurysms  Allergies  Allergen Reactions   Sulfamethoxazole-Trimethoprim Hives and Rash     REVIEW OF SYSTEMS (Negative unless checked)  Constitutional: '[]'$ Weight loss  '[]'$ Fever  '[]'$ Chills Cardiac: '[]'$ Chest pain   '[]'$ Chest pressure   '[]'$ Palpitations   '[]'$ Shortness of breath when laying flat   '[]'$ Shortness of breath at rest   '[]'$ Shortness of breath with exertion. Vascular:  '[]'$ Pain in legs with walking    '[]'$ Pain in legs at rest   '[]'$ Pain in legs when laying flat   '[]'$ Claudication   '[]'$ Pain in feet when walking  '[x]'$ Pain in feet at rest  '[x]'$ Pain in feet when laying flat   '[]'$ History of DVT   '[]'$ Phlebitis   '[]'$ Swelling in legs   '[]'$ Varicose veins   '[x]'$ Non-healing ulcers Pulmonary:   '[]'$ Uses home oxygen   '[]'$ Productive cough   '[]'$ Hemoptysis   '[]'$ Wheeze  '[]'$ COPD   '[]'$ Asthma Neurologic:  '[]'$ Dizziness  '[]'$ Blackouts   '[]'$ Seizures   '[]'$ History of stroke   '[]'$ History of TIA  '[]'$ Aphasia   '[]'$   Temporary blindness   '[]'$ Dysphagia   '[]'$ Weakness or numbness in arms   '[]'$ Weakness or numbness in legs Musculoskeletal:  '[x]'$ Arthritis   '[]'$ Joint swelling   '[x]'$ Joint pain   '[]'$ Low back pain Hematologic:  '[]'$ Easy bruising  '[]'$ Easy bleeding   '[]'$ Hypercoagulable state   '[]'$ Anemic  '[]'$ Hepatitis Gastrointestinal:  '[]'$ Blood in stool   '[]'$ Vomiting blood  '[x]'$ Gastroesophageal reflux/heartburn   '[]'$ Difficulty swallowing. Genitourinary:  '[]'$ Chronic kidney disease   '[]'$ Difficult urination  '[x]'$ Frequent urination  '[]'$ Burning with urination   '[]'$ Blood in urine Skin:  '[]'$ Rashes   '[x]'$ Ulcers   '[x]'$ Wounds Psychological:  '[]'$ History of anxiety   '[]'$  History of major depression.  Physical Examination  Vitals:   12/14/21 1408 12/14/21 1430 12/14/21 1502 12/14/21 1557  BP: (!) 158/87 (!) 161/87 (!) 154/97 134/71  Pulse:  84 85 90  Resp: (!) 22 (!) '23 18 18  '$ Temp:  (!) 97.4 F (36.3 C)  97.8 F (36.6 C)  TempSrc:      SpO2:  100% 95% 97%  Weight:      Height:       Body mass index is 35.87 kg/m. Gen:  WD/WN, NAD Head: [place]/AT, No temporalis wasting.  Ear/Nose/Throat: Hearing grossly intact, nares w/o erythema or drainage, oropharynx w/o Erythema/Exudate Eyes: Sclera non-icteric, conjunctiva clear Neck: Trachea midline.  No JVD.  Pulmonary:  Good air movement, respirations not labored, equal bilaterally.  Cardiac: RRR, normal S1, S2. Vascular:  Vessel Right Left  Radial Palpable Palpable   Musculoskeletal: M/S 5/5 throughout.  Left foot and ankle are currently bandaged  with a large Ace wrap bulky bandage.  There is bloody drainage on the bandage on the left foot.  Trace right lower extremity edema, 1+ left lower extremity Neurologic: Sensation grossly intact in extremities.  Symmetrical.  Speech is fluent. Motor exam as listed above. Psychiatric: Judgment intact, Mood & affect appropriate for pt's clinical situation. Dermatologic: Left foot surgical wound is currently dressed as above    CBC Lab Results  Component Value Date   WBC 7.4 12/13/2021   HGB 13.9 12/13/2021   HCT 41.9 12/13/2021   MCV 91.9 12/13/2021   PLT 266 12/13/2021    BMET    Component Value Date/Time   NA 134 (L) 12/12/2021 1041   NA 139 02/10/2018 1154   NA 134 (L) 03/26/2014 2050   K 3.7 12/12/2021 1041   K 4.3 03/26/2014 2050   CL 100 12/12/2021 1041   CL 103 03/26/2014 2050   CO2 26 12/12/2021 1041   CO2 29 03/26/2014 2050   GLUCOSE 193 (H) 12/12/2021 1041   GLUCOSE 150 (H) 03/26/2014 2050   BUN 22 (H) 12/12/2021 1041   BUN 13 02/10/2018 1154   BUN 18 03/26/2014 2050   CREATININE 1.09 12/14/2021 0602   CREATININE 0.86 03/26/2014 2050   CALCIUM 9.1 12/12/2021 1041   CALCIUM 8.7 03/26/2014 2050   GFRNONAA >60 12/14/2021 0602   GFRNONAA >60 03/26/2014 2050   GFRNONAA >60 10/29/2012 1757   GFRAA >60 06/25/2019 0453   GFRAA >60 03/26/2014 2050   GFRAA >60 10/29/2012 1757   Estimated Creatinine Clearance: 90.9 mL/min (by C-G formula based on SCr of 1.09 mg/dL).  COAG Lab Results  Component Value Date   INR 0.9 12/12/2021   INR 1.05 01/08/2018    Radiology US ARTERIAL ABI (SCREENING LOWER EXTREMITY)  Result Date: 12/13/2021 CLINICAL DATA:  Bilateral lower extremity rest pain and claudication. Diabetes, hypertension, hyperlipidemia. EXAM: NONINVASIVE PHYSIOLOGIC VASCULAR STUDY OF BILATERAL LOWER EXTREMITIES TECHNIQUE: Evaluation of  both lower extremities were performed at rest, including calculation of ankle-brachial indices with single level Doppler, pressure  and pulse volume recording. COMPARISON:  01/08/2018 FINDINGS: Right ABI:  1.20 (previously 1.44) Left ABI:  0.83 (previously 1.35) Right Lower Extremity:  Normal arterial waveforms at the ankle. Left Lower Extremity:  Normal arterial waveforms at the ankle. IMPRESSION: 1. Mild left lower extremity peripheral arterial disease at rest. 2. Normal right ABI. Electronically Signed   By: Lucrezia Europe M.D.   On: 12/13/2021 15:25   MR FOOT LEFT WO CONTRAST  Result Date: 12/12/2021 CLINICAL DATA:  Foot swelling. History of poorly controlled diabetes and history of osteomyelitis. Recurrent diabetic foot wounds. Osteomyelitis suspected. Based on area of interest hindfoot covered with extended field of view. EXAM: MRI OF THE LEFT FOOT WITHOUT CONTRAST TECHNIQUE: Multiplanar, multisequence MR imaging of the left hindfoot and midfoot was performed. No intravenous contrast was administered. COMPARISON:  left foot radiographs 11/28/2017; MRI left foot without and with contrast 11/29/2017 FINDINGS: TENDONS Peroneal: The peroneus brevis tendon extends towards the resected base of the fifth metatarsal. The peroneus longus tendon appears intact. Posteromedial: Mild posterior tibial tenosynovitis. The flexor digitorum longus and flexor hallucis longus tendons are intact. Anterior: The tibialis anterior, extensor hallucis longus, and extensor digitorum longus tendons are intact. Achilles: Intact. Plantar Fascia: Intact. Edema within the proximal abductor hallucis, flexor digitorum brevis and abductor digiti minimi muscles. LIGAMENTS Lateral: Attenuation of the calcaneofibular ligament. Mild thickening of the anterior talofibular ligament, possibly the sequela of remote trauma. No fluid bright tear. The anterior and posterior tibiofibular and posterior talofibular ligaments are intact. Medial: The tibiotalar deep deltoid and tibial spring ligaments are intact. CARTILAGE Ankle Joint: Intact cartilage. Subtalar Joints/Sinus Tarsi: Fat is  preserved within sinus tarsi. Bones: Interval postsurgical changes of resection of the proximal third of fifth metatarsal where previously there was concern for osteomyelitis on 11/29/2017 MRI. There is ulceration again seen at the plantar lateral aspect of the surgical site. There is decreased T1 and increased T2 linear signal within the overlying skin (axial series 6 images 33-35, coronal series 8, images 14 through 16) that appears to represent an ulcer and sinus tract contacting the plantar lateral aspect of the cuboid. There is moderate marrow edema within the plantar lateral aspect of the cuboid (coronal series 8, image 18, axial series 6, image 30) with cortical erosion of the plantar lateral cuboid indicating osteomyelitis. There is mild marrow edema within the adjacent posterolateral aspect of the postsurgical remaining portion of fifth metatarsal and more moderate marrow edema within the proximal lateral aspect of the base of fourth metatarsal (axial series 6, image 31 and sagittal series 10, image 15) also concerning for osteomyelitis. There is metallic susceptibility artifact around the region of marrow fat that may represent a prior screw tract within the calcaneus (sagittal series 9, images 15-18, axial series 6, images 24 and 25). Other: Nonspecific plantar and dorsal midfoot edema/myositis. Moderate ankle hindfoot, and midfoot subcutaneous fat edema and swelling. IMPRESSION: Compared to 11/29/2017: Interval resection of the proximal third of fifth metatarsal, an area of osteomyelitis on the prior MRI. There is a persistent ulceration of the plantar lateral aspect of the resection site, with sinus tract extending towards the adjacent cuboid bone. Findings suspicious for osteomyelitis of the plantar lateral aspect of the cuboid and adjacent base of fourth metatarsal and possibly the proximal most aspect of the remaining fifth metatarsal shaft. Electronically Signed   By: Yvonne Kendall M.D.   On:  12/12/2021 17:30   DG Chest 2 View  Result Date: 12/12/2021 CLINICAL DATA:  Suspected sepsis. EXAM: CHEST - 2 VIEW COMPARISON:  Chest radiograph 06/25/2019. FINDINGS: Similar mildly elevated left hemidiaphragm. Similar low lung volumes. No consolidation. No visible pleural effusions or pneumothorax. Cardiomediastinal silhouette is within normal limits and similar to prior. No evidence of acute osseous abnormality. Degenerative changes of the thoracic spine with bridging osteophytes. IMPRESSION: Similar low lung volumes without evidence of acute cardiopulmonary disease. Electronically Signed   By: Margaretha Sheffield M.D.   On: 12/12/2021 11:10      Assessment/Plan 1.  PAD with ulceration and osteomyelitis left lower extremity.  Last had an angiogram back in 2019 where tibial disease was seen on both legs.  ABI of 0.83 and likely recurrent tibial disease and possibly more proximal disease.  Had debridement by podiatry today and did well.  Discussed with the patient that we will plan an angiogram on Monday, 619.  Have discussed the risks and benefits of the procedure.  He and his family agreed to proceed. 2.  Diabetes.  This an underlying cause of both his ulceration and his vascular disease and blood glucose control important in reducing the progression of atherosclerotic disease. Also, involved in wound healing. On appropriate medications. 3. Hypertension. Stable on outpatient medications and blood pressure control important in reducing the progression of atherosclerotic disease. On appropriate oral medications.    Leotis Pain, MD  12/14/2021 4:46 PM    This note was created with Dragon medical transcription system.  Any error is purely unintentional

## 2021-12-14 NOTE — Anesthesia Procedure Notes (Signed)
Procedure Name: LMA Insertion Date/Time: 12/14/2021 12:56 PM  Performed by: Lia Foyer, CRNAPre-anesthesia Checklist: Patient identified, Emergency Drugs available, Suction available and Patient being monitored Patient Re-evaluated:Patient Re-evaluated prior to induction Oxygen Delivery Method: Circle system utilized Preoxygenation: Pre-oxygenation with 100% oxygen Induction Type: IV induction Ventilation: Mask ventilation without difficulty LMA: LMA flexible inserted LMA Size: 4.5 Tube type: Oral Number of attempts: 1 Placement Confirmation: positive ETCO2 and breath sounds checked- equal and bilateral Tube secured with: Tape Dental Injury: Teeth and Oropharynx as per pre-operative assessment

## 2021-12-14 NOTE — Progress Notes (Signed)
Progress Note  Patient: [name] ZTI:[id number] DOB: 02-08-1961  DOA: 12/12/2021  DOS: 12/14/2021    Brief hospital course: [name] is a 61 y.o. male with a history of IDT2DM with diabetic neuropathy, HTN, HLD, PVD s/p multiple toe amputations, OSA on CPAP who was sent to the ED 6/14 from podiatry office for concern of worsening ulceration with evidence of tracking/deep probing to the plantar aspect of the left forefoot. He was afebrile with no leukocytosis, and MRI reveals changes consistent with osteomyelitis of the cuboid and 4th metatarsal for which I&D is planned 6/16. ID is consulted for antibiotic recommendations.   Assessment and Plan: Diabetic infection of left plantar foot with underlying sinus tract and osteomyelitis: Not septic. Reported wound culture done in office showing pseudomonas, morganella. - Per Dr. Vickki Muff, plan I&D in [place] 6/16. - Screening ABI ordered. - Vancomycin and zosyn were started on admission, will defer further management to ID.  - Pain control as ordered  PAD: Left ABI is 0.83, may be contributing to poor wound healing. I've requested vascular surgery consult, they will plan angiogram.   IDT2DM: HbA1c 7.7%.  - Continue glargine insulin  - Continue SSI AC/HS.  - ASA, statin  HTN:  - Continue norvasc '10mg'$ , HCTZ '25mg'$ , losartan '100mg'$ .  - Hydralazine IV prn  HLD:  - Continue simvastatin.  OSA:  - CPAP qHS.  Obesity: Estimated body mass index is 35.87 kg/m as calculated from the following:   Height as of this encounter: '5\' 10"'$  (1.778 m).   Weight as of this encounter: 113.4 kg.   Subjective: In good spirits, no new complaints. Pain is controlled.  Objective: Vitals:   12/14/21 1408 12/14/21 1430 12/14/21 1502 12/14/21 1557  BP: (!) 158/87 (!) 161/87 (!) 154/97 134/71  Pulse:  84 85 90  Resp: (!) 22 (!) '23 18 18  '$ Temp:  (!) 97.4 F (36.3 C)  97.8 F (36.6 C)  TempSrc:      SpO2:  100% 95% 97%  Weight:      Height:       Gen: 61  y.o. male in no distress Pulm: Nonlabored breathing room air. Clear. CV: Regular rate and rhythm. No murmur, rub, or gallop. No JVD, no pitting dependent edema. GI: Abdomen soft, non-tender, non-distended, with normoactive bowel sounds.  Ext: Warm, no deformities Skin: No new rashes, lesions or ulcers on visualized skin. DP pulses 1+ L, 2+ R Neuro: Alert and oriented. Decreased sensation distal LE's. No focal neurological deficits. Psych: Judgement and insight appear fair. Mood euthymic & affect congruent. Behavior is appropriate.    Data Personally reviewed: CBC: Recent Labs  Lab 12/12/21 1041 12/13/21 0526  WBC 7.5 7.4  NEUTROABS 5.1  --   HGB 14.2 13.9  HCT 43.3 41.9  MCV 91.7 91.9  PLT 279 825   Basic Metabolic Panel: Recent Labs  Lab 12/12/21 1041 12/13/21 0526 12/14/21 0602  NA 134*  --   --   K 3.7  --   --   CL 100  --   --   CO2 26  --   --   GLUCOSE 193*  --   --   BUN 22*  --   --   CREATININE 0.92 1.03 1.09  CALCIUM 9.1  --   --    GFR: Estimated Creatinine Clearance: 90.9 mL/min (by C-G formula based on SCr of 1.09 mg/dL). Liver Function Tests: Recent Labs  Lab 12/12/21 1041  AST 20  ALT 20  ALKPHOS 88  BILITOT 0.6  PROT 8.0  ALBUMIN 3.9   No results for input(s): "LIPASE", "AMYLASE" in the last 168 hours. No results for input(s): "AMMONIA" in the last 168 hours. Coagulation Profile: Recent Labs  Lab 12/12/21 1041  INR 0.9   Cardiac Enzymes: No results for input(s): "CKTOTAL", "CKMB", "CKMBINDEX", "TROPONINI" in the last 168 hours. BNP (last 3 results) No results for input(s): "PROBNP" in the last 8760 hours. HbA1C: No results for input(s): "HGBA1C" in the last 72 hours. CBG: Recent Labs  Lab 12/13/21 1730 12/13/21 2100 12/14/21 0806 12/14/21 1138 12/14/21 1408  GLUCAP 246* 213* 121* 95 85   Lipid Profile: No results for input(s): "CHOL", "HDL", "LDLCALC", "TRIG", "CHOLHDL", "LDLDIRECT" in the last 72 hours. Thyroid Function  Tests: No results for input(s): "TSH", "T4TOTAL", "FREET4", "T3FREE", "THYROIDAB" in the last 72 hours. Anemia Panel: No results for input(s): "VITAMINB12", "FOLATE", "FERRITIN", "TIBC", "IRON", "RETICCTPCT" in the last 72 hours. Urine analysis:    Component Value Date/Time   COLORURINE STRAW (A) 12/12/2021 1041   APPEARANCEUR CLEAR (A) 12/12/2021 1041   LABSPEC 1.015 12/12/2021 1041   PHURINE 6.0 12/12/2021 1041   GLUCOSEU >=500 (A) 12/12/2021 1041   HGBUR SMALL (A) 12/12/2021 1041   BILIRUBINUR NEGATIVE 12/12/2021 1041   KETONESUR NEGATIVE 12/12/2021 1041   PROTEINUR NEGATIVE 12/12/2021 1041   NITRITE NEGATIVE 12/12/2021 [place] 12/12/2021 1041   Recent Results (from the past 240 hour(s))  Culture, blood (Routine x 2)     Status: None (Preliminary result)   Collection Time: 12/12/21 10:41 AM   Specimen: BLOOD  Result Value Ref Range Status   Specimen Description BLOOD RIGHT [facility]  Final   Special Requests   Final    BOTTLES DRAWN AEROBIC AND ANAEROBIC Blood Culture adequate volume   Culture   Final    NO GROWTH 2 DAYS Performed at [facility], [address], [place], [place] [address]    Report Status PENDING  Incomplete  Culture, blood (Routine x 2)     Status: None (Preliminary result)   Collection Time: 12/12/21  1:18 PM   Specimen: BLOOD  Result Value Ref Range Status   Specimen Description BLOOD BLOOD RIGHT [facility]  Final   Special Requests   Final    BOTTLES DRAWN AEROBIC AND ANAEROBIC Blood Culture adequate volume   Culture   Final    NO GROWTH 2 DAYS Performed at [facility], [address] [place], [place] [address]    Report Status PENDING  Incomplete     US ARTERIAL ABI (SCREENING LOWER EXTREMITY)  Result Date: 12/13/2021 CLINICAL DATA:  Bilateral lower extremity rest pain and claudication. Diabetes, hypertension, hyperlipidemia. EXAM: NONINVASIVE PHYSIOLOGIC VASCULAR STUDY OF BILATERAL LOWER EXTREMITIES TECHNIQUE:  Evaluation of both lower extremities were performed at rest, including calculation of ankle-brachial indices with single level Doppler, pressure and pulse volume recording. COMPARISON:  01/08/2018 FINDINGS: Right ABI:  1.20 (previously 1.44) Left ABI:  0.83 (previously 1.35) Right Lower Extremity:  Normal arterial waveforms at the ankle. Left Lower Extremity:  Normal arterial waveforms at the ankle. IMPRESSION: 1. Mild left lower extremity peripheral arterial disease at rest. 2. Normal right ABI. Electronically Signed   By: Lucrezia Europe M.D.   On: 12/13/2021 15:25   MR FOOT LEFT WO CONTRAST  Result Date: 12/12/2021 CLINICAL DATA:  Foot swelling. History of poorly controlled diabetes and history of osteomyelitis. Recurrent diabetic foot wounds. Osteomyelitis suspected. Based on area of interest hindfoot covered  with extended field of view. EXAM: MRI OF THE LEFT FOOT WITHOUT CONTRAST TECHNIQUE: Multiplanar, multisequence MR imaging of the left hindfoot and midfoot was performed. No intravenous contrast was administered. COMPARISON:  left foot radiographs 11/28/2017; MRI left foot without and with contrast 11/29/2017 FINDINGS: TENDONS Peroneal: The peroneus brevis tendon extends towards the resected base of the fifth metatarsal. The peroneus longus tendon appears intact. Posteromedial: Mild posterior tibial tenosynovitis. The flexor digitorum longus and flexor hallucis longus tendons are intact. Anterior: The tibialis anterior, extensor hallucis longus, and extensor digitorum longus tendons are intact. Achilles: Intact. Plantar Fascia: Intact. Edema within the proximal abductor hallucis, flexor digitorum brevis and abductor digiti minimi muscles. LIGAMENTS Lateral: Attenuation of the calcaneofibular ligament. Mild thickening of the anterior talofibular ligament, possibly the sequela of remote trauma. No fluid bright tear. The anterior and posterior tibiofibular and posterior talofibular ligaments are intact. Medial:  The tibiotalar deep deltoid and tibial spring ligaments are intact. CARTILAGE Ankle Joint: Intact cartilage. Subtalar Joints/Sinus Tarsi: Fat is preserved within sinus tarsi. Bones: Interval postsurgical changes of resection of the proximal third of fifth metatarsal where previously there was concern for osteomyelitis on 11/29/2017 MRI. There is ulceration again seen at the plantar lateral aspect of the surgical site. There is decreased T1 and increased T2 linear signal within the overlying skin (axial series 6 images 33-35, coronal series 8, images 14 through 16) that appears to represent an ulcer and sinus tract contacting the plantar lateral aspect of the cuboid. There is moderate marrow edema within the plantar lateral aspect of the cuboid (coronal series 8, image 18, axial series 6, image 30) with cortical erosion of the plantar lateral cuboid indicating osteomyelitis. There is mild marrow edema within the adjacent posterolateral aspect of the postsurgical remaining portion of fifth metatarsal and more moderate marrow edema within the proximal lateral aspect of the base of fourth metatarsal (axial series 6, image 31 and sagittal series 10, image 15) also concerning for osteomyelitis. There is metallic susceptibility artifact around the region of marrow fat that may represent a prior screw tract within the calcaneus (sagittal series 9, images 15-18, axial series 6, images 24 and 25). Other: Nonspecific plantar and dorsal midfoot edema/myositis. Moderate ankle hindfoot, and midfoot subcutaneous fat edema and swelling. IMPRESSION: Compared to 11/29/2017: Interval resection of the proximal third of fifth metatarsal, an area of osteomyelitis on the prior MRI. There is a persistent ulceration of the plantar lateral aspect of the resection site, with sinus tract extending towards the adjacent cuboid bone. Findings suspicious for osteomyelitis of the plantar lateral aspect of the cuboid and adjacent base of fourth  metatarsal and possibly the proximal most aspect of the remaining fifth metatarsal shaft. Electronically Signed   By: Yvonne Kendall M.D.   On: 12/12/2021 17:30    Family Communication: None at bedside  Disposition: Status is: Inpatient Remains inpatient appropriate because: Needs vascular evaluation, then I&D and IV antibiotics Planned Discharge Destination: Home    Patrecia Pour, MD 12/14/2021 4:23 PM Page by [email or web address]

## 2021-12-14 NOTE — Anesthesia Preprocedure Evaluation (Signed)
Anesthesia Evaluation  Patient identified by MRN, date of birth, ID band Patient awake    Reviewed: Allergy & Precautions, NPO status , Patient's Chart, lab work & pertinent test results  Airway Mallampati: III  TM Distance: >3 FB Neck ROM: Full    Dental  (+) Upper Dentures, Lower Dentures   Pulmonary neg pulmonary ROS, sleep apnea and Continuous Positive Airway Pressure Ventilation ,    Pulmonary exam normal  + decreased breath sounds      Cardiovascular Exercise Tolerance: Good hypertension, Pt. on medications + Peripheral Vascular Disease  negative cardio ROS Normal cardiovascular exam Rhythm:Regular     Neuro/Psych negative neurological ROS  negative psych ROS   GI/Hepatic negative GI ROS, Neg liver ROS, GERD  ,  Endo/Other  negative endocrine ROSdiabetes, Well Controlled, Type 1, Insulin Dependent  Renal/GU      Musculoskeletal   Abdominal (+) + obese,   Peds negative pediatric ROS (+)  Hematology negative hematology ROS (+)   Anesthesia Other Findings Past Medical History: No date: Arthritis 12/26/2017: Bacterial infection due to Morganella morganii No date: BPH (benign prostatic hypertrophy) No date: Diabetes mellitus No date: Diabetic neuropathy (HCC) No date: Hypertension No date: Metatarsal bone fracture     Comment:  left 5th toe No date: Osteomyelitis of toe of right foot (HCC) No date: Peripheral vascular disease ([place]) No date: Post-operative infection     Comment:  and diabetic ulcer left foot 12/26/2017: Sepsis due to Streptococcus, group B (HCC) No date: Sleep apnea     Comment:   does wear CPAP No date: Wears glasses  Past Surgical History: No date: AMPUTATION TOE; Left     Comment:  small toe 04/02/2019: AMPUTATION TOE; Right     Comment:  Procedure: AMPUTATION TOE 2ND;  Surgeon: Samara Deist,              DPM;  Location: [facility];  Service: Podiatry;                Laterality:  Right; No date: COLONOSCOPY W/ BIOPSIES AND POLYPECTOMY 01/11/2016: I & D EXTREMITY; Left     Comment:  Procedure: LEFT FOOT IRRIGATION AND DEBRIDEMENT WOUND               VAC AND REMOVAL OF HARDWARE;  Surgeon: Wylene Simmer, MD;                Location: [place];  Service: Orthopedics;  Laterality:               Left; 12/01/2017: LOWER EXTREMITY ANGIOGRAPHY; N/A     Comment:  Procedure: Lower Extremity Angiography;  Surgeon: Algernon Huxley, MD;  Location: [place] [facility];  Service:               Cardiovascular;  Laterality: N/A; 12/04/2017: LOWER EXTREMITY ANGIOGRAPHY; Right     Comment:  Procedure: Lower Extremity Angiography;  Surgeon: Algernon Huxley, MD;  Location: [place] [facility];  Service:               Cardiovascular;  Laterality: Right; 03/12/2018: LOWER EXTREMITY ANGIOGRAPHY; Left     Comment:  Procedure: LOWER EXTREMITY ANGIOGRAPHY;  Surgeon: Algernon Huxley, MD;  Location: [place] [facility];  Service:               Cardiovascular;  Laterality: Left; 06/08/2015: ORIF TOE FRACTURE; Left     Comment:  Procedure: OPEN REDUCTION INTERNAL FIXATION (ORIF) LEFT               FIFTH METATARSAL BASE FRACTURE NONUNION; CALCANEAL               AUTOGRAFT ;  Surgeon: Wylene Simmer, MD;  Location: [place];  Service: Orthopedics;  Laterality:               Left; 2005: PATELLA RECONSTRUCTION; Left 2014: PROSTATE ABLATION No date: TOE AMPUTATION     Comment:  partial amputation right great toe 11/30/2017: WOUND DEBRIDEMENT; Bilateral     Comment:  Procedure: [place];  Surgeon: Sharlotte Alamo,               DPM;  Location: [facility];  Service: Podiatry;                Laterality: Bilateral;  BMI    Body Mass Index: 35.87 kg/m      Reproductive/Obstetrics negative OB ROS                             Anesthesia Physical Anesthesia Plan  ASA: 3  Anesthesia Plan: General   Post-op Pain  Management:    Induction: Intravenous  PONV Risk Score and Plan: Dexamethasone, Ondansetron, Midazolam and Treatment may vary due to age or medical condition  Airway Management Planned: LMA  Additional Equipment:   Intra-op Plan:   Post-operative Plan: Extubation in OR  Informed Consent: I have reviewed the patients History and Physical, chart, labs and discussed the procedure including the risks, benefits and alternatives for the proposed anesthesia with the patient or authorized representative who has indicated his/her understanding and acceptance.     Dental Advisory Given  Plan Discussed with: CRNA and Surgeon  Anesthesia Plan Comments:         Anesthesia Quick Evaluation

## 2021-12-14 NOTE — Progress Notes (Signed)
PT observed on home CPAP, PT independent, places themselves on and off. No supplemental O2 needed. CPAP operational and on Auto-settings.

## 2021-12-15 DIAGNOSIS — E1142 Type 2 diabetes mellitus with diabetic polyneuropathy: Secondary | ICD-10-CM | POA: Diagnosis not present

## 2021-12-15 DIAGNOSIS — E11628 Type 2 diabetes mellitus with other skin complications: Secondary | ICD-10-CM | POA: Diagnosis not present

## 2021-12-15 DIAGNOSIS — G4733 Obstructive sleep apnea (adult) (pediatric): Secondary | ICD-10-CM | POA: Diagnosis not present

## 2021-12-15 DIAGNOSIS — I10 Essential (primary) hypertension: Secondary | ICD-10-CM | POA: Diagnosis not present

## 2021-12-15 LAB — GLUCOSE, CAPILLARY
Glucose-Capillary: 210 mg/dL — ABNORMAL HIGH (ref 70–99)
Glucose-Capillary: 157 mg/dL — ABNORMAL HIGH (ref 70–99)
Glucose-Capillary: 175 mg/dL — ABNORMAL HIGH (ref 70–99)
Glucose-Capillary: 130 mg/dL — ABNORMAL HIGH (ref 70–99)

## 2021-12-15 LAB — CREATININE, SERUM
Creatinine, Ser: 1.03 mg/dL (ref 0.61–1.24)
GFR, Estimated: >60 mL/min (ref >60)

## 2021-12-15 MED ORDER — INSULIN ASPART 100 UNIT/ML IJ SOLN
3.0000 [IU] | Freq: Three times a day (TID) | INTRAMUSCULAR | Status: DC
  Administered 2021-12-15 – 2021-12-18 (×8): 3 [IU] via SUBCUTANEOUS
  Filled 2021-12-15 (×5): qty 1

## 2021-12-15 MED ORDER — INSULIN GLARGINE-YFGN 100 UNIT/ML ~~LOC~~ SOLN
15.0000 [IU] | Freq: Two times a day (BID) | SUBCUTANEOUS | Status: DC
  Administered 2021-12-15 – 2021-12-18 (×6): 15 [IU] via SUBCUTANEOUS
  Filled 2021-12-15 (×8): qty 0.15

## 2021-12-15 MED ORDER — INSULIN GLARGINE-YFGN 100 UNIT/ML ~~LOC~~ SOLN: 20.0000 [IU] | Freq: Two times a day (BID) | SUBCUTANEOUS | Status: DC

## 2021-12-15 NOTE — Progress Notes (Signed)
Daily Progress Note   Subjective  - 1 Day Post-Op  F/u left foot debridement and biopsy.  No complaints.  Objective Vitals:   12/14/21 1557 12/14/21 2015 12/15/21 0526 12/15/21 0841  BP: 134/71 (!) 147/79 139/82 136/72  Pulse: 90 98 87 92  Resp: '18 18 18 18  '$ Temp: 97.8 F (36.6 C) 98.2 F (36.8 C) 98.1 F (36.7 C) 98.4 F (36.9 C)  TempSrc:  Oral    SpO2: 97% 98% 98% 98%  Weight:      Height:        Physical Exam: Wound is intact.  Mild bloody drainage to bandage.  Bone culture is pending.  Outpt culture grew pseudomonas and moranelli sp.  Laboratory CBC    Component Value Date/Time   WBC 7.4 12/13/2021 0526   HGB 13.9 12/13/2021 0526   HGB 13.0 03/26/2014 2050   HCT 41.9 12/13/2021 0526   HCT 39.7 (L) 03/26/2014 2050   PLT 266 12/13/2021 0526   PLT 202 03/26/2014 2050    BMET    Component Value Date/Time   NA 134 (L) 12/12/2021 1041   NA 139 02/10/2018 1154   NA 134 (L) 03/26/2014 2050   K 3.7 12/12/2021 1041   K 4.3 03/26/2014 2050   CL 100 12/12/2021 1041   CL 103 03/26/2014 2050   CO2 26 12/12/2021 1041   CO2 29 03/26/2014 2050   GLUCOSE 193 (H) 12/12/2021 1041   GLUCOSE 150 (H) 03/26/2014 2050   BUN 22 (H) 12/12/2021 1041   BUN 13 02/10/2018 1154   BUN 18 03/26/2014 2050   CREATININE 1.03 12/15/2021 0419   CREATININE 0.86 03/26/2014 2050   CALCIUM 9.1 12/12/2021 1041   CALCIUM 8.7 03/26/2014 2050   GFRNONAA >60 12/15/2021 0419   GFRNONAA >60 03/26/2014 2050   GFRNONAA >60 10/29/2012 1757   GFRAA >60 06/25/2019 0453   GFRAA >60 03/26/2014 2050   GFRAA >60 10/29/2012 1757    Assessment/Planning: Osteomyelitis left foot. DM with neuropathy and PVD  Vascular is planning for angio tomorrow. Dressing changed today. Appreciate ID assistance with home IV abx. Recommend NWB to left foot. Pt is at high risk of further infection and need for further intervention if fails current therapy. Will continue to follow.  Samara Deist  A  12/15/2021, 2:31 PM

## 2021-12-15 NOTE — Progress Notes (Signed)
[facility] PROGRESS NOTE    Progress Note  [name]  UJW:[id number] DOB: 1961/05/12 DOA: 12/12/2021 PCP: Theotis Burrow, MD     Brief Narrative:   [name] is an 61 y.o. male past medical history of insulin-dependent diabetes mellitus type 2 with peripheral neuropathy, peripheral vascular disease status post multiple toe amputations who was sent to the ED on 12/12/2021 from the podiatrist office due to worsening ulcer with evidence of tracking and deep probing to the plantar aspect of the left forefoot.  MRI revealed osteomyelitis of the cuboid and fourth metatarsal for which I&D does plan and performed on 12/14/2021, ID was consulted for recommendations.    Assessment/Plan:   Diabetic infection of left foot [facility]) Dr. Follow-up perform I&D in [place] on 12/14/2021. Was started on Vanco and Zosyn further recommendation of antibiotics per ID. ABIs on the right appear essentially unchanged from prior studies back in 2019, left ABI appears mildly decreased compared to prior studies.  Peripheral arterial disease: ABI showed reduced flow in the left side probably contributing to poor wound healing. Vascular surgery has been consulted they are planning for an angiogram on 12/15/2021.  Insulin-dependent diabetes mellitus type 2: With an A1c of 7.7 continue long-acting insulin plus sliding scale, aspirin and statins.  Essential HTN (hypertension) Continue Norvasc hydrochlorothiazide and losartan. We will have to hold hydrochlorothiazide prior to angiogram.  Hyperlipidemia: Continue statins.  Stress radiograph negative Continue CPAP at night.  Obesity with a BMI greater than 30. Counseling.   DVT prophylaxis: lovenox Family Communication:None Status is: Inpatient Remains inpatient appropriate because: Diabetic foot infection.    Code Status:     Code Status Orders  (From admission, onward)           Start     Ordered   12/12/21 1336  Full code   Continuous        12/12/21 1347           Code Status History     Date Active Date Inactive Code Status Order ID Comments User Context   04/01/2019 2239 04/04/2019 1414 Full Code [id number]  Lance Coon, MD ED   03/12/2018 0956 03/12/2018 1531 Full Code [id number]  Algernon Huxley, MD Inpatient   11/29/2017 0218 12/05/2017 1511 Full Code [id number]  Lance Coon, MD Inpatient   03/05/2017 1831 03/07/2017 1914 Full Code [id number]  Hillary Bow, MD ED   01/11/2016 1845 01/15/2016 1916 Full Code [id number]  Wylene Simmer, MD Inpatient         IV Access:   Peripheral IV   Procedures and diagnostic studies:   US ARTERIAL ABI (SCREENING LOWER EXTREMITY)  Result Date: 12/13/2021 CLINICAL DATA:  Bilateral lower extremity rest pain and claudication. Diabetes, hypertension, hyperlipidemia. EXAM: NONINVASIVE PHYSIOLOGIC VASCULAR STUDY OF BILATERAL LOWER EXTREMITIES TECHNIQUE: Evaluation of both lower extremities were performed at rest, including calculation of ankle-brachial indices with single level Doppler, pressure and pulse volume recording. COMPARISON:  01/08/2018 FINDINGS: Right ABI:  1.20 (previously 1.44) Left ABI:  0.83 (previously 1.35) Right Lower Extremity:  Normal arterial waveforms at the ankle. Left Lower Extremity:  Normal arterial waveforms at the ankle. IMPRESSION: 1. Mild left lower extremity peripheral arterial disease at rest. 2. Normal right ABI. Electronically Signed   By: Lucrezia Europe M.D.   On: 12/13/2021 15:25     Medical Consultants:   None.   Subjective:    [name] no complaints pain is controlled.  Objective:    Vitals:  12/14/21 1502 12/14/21 1557 12/14/21 2015 12/15/21 0526  BP: (!) 154/97 134/71 (!) 147/79 139/82  Pulse: 85 90 98 87  Resp: '18 18 18 18  '$ Temp: 97.8 F (36.6 C) 97.8 F (36.6 C) 98.2 F (36.8 C) 98.1 F (36.7 C)  TempSrc:   Oral   SpO2: 95% 97% 98% 98%  Weight:      Height:       SpO2: 98 % O2 Flow Rate (L/min): 6  L/min   Intake/Output Summary (Last 24 hours) at 12/15/2021 0706 Last data filed at 12/14/2021 1928 Gross per 24 hour  Intake 1000 ml  Output 710 ml  Net 290 ml   Filed Weights   12/12/21 1039  Weight: 113.4 kg    Exam: General exam: In no acute distress. Respiratory system: Good air movement and clear to auscultation. Cardiovascular system: S1 & S2 heard, RRR. No JVD. Gastrointestinal system: Abdomen is nondistended, soft and nontender.  Extremities: The left foot is wrapped Skin: No rashes, lesions or ulcers Psychiatry: Judgement and insight appear normal. Mood & affect appropriate.    Data Reviewed:    Labs: Basic Metabolic Panel: Recent Labs  Lab 12/12/21 1041 12/13/21 0526 12/14/21 0602 12/15/21 0419  NA 134*  --   --   --   K 3.7  --   --   --   CL 100  --   --   --   CO2 26  --   --   --   GLUCOSE 193*  --   --   --   BUN 22*  --   --   --   CREATININE 0.92 1.03 1.09 1.03  CALCIUM 9.1  --   --   --    GFR Estimated Creatinine Clearance: 96.2 mL/min (by C-G formula based on SCr of 1.03 mg/dL). Liver Function Tests: Recent Labs  Lab 12/12/21 1041  AST 20  ALT 20  ALKPHOS 88  BILITOT 0.6  PROT 8.0  ALBUMIN 3.9   No results for input(s): "LIPASE", "AMYLASE" in the last 168 hours. No results for input(s): "AMMONIA" in the last 168 hours. Coagulation profile Recent Labs  Lab 12/12/21 1041  INR 0.9   COVID-19 Labs  Recent Labs    12/12/21 1041  CRP 2.6*    Lab Results  Component Value Date   SARSCOV2NAA NEGATIVE 04/01/2019    CBC: Recent Labs  Lab 12/12/21 1041 12/13/21 0526  WBC 7.5 7.4  NEUTROABS 5.1  --   HGB 14.2 13.9  HCT 43.3 41.9  MCV 91.7 91.9  PLT 279 266   Cardiac Enzymes: No results for input(s): "CKTOTAL", "CKMB", "CKMBINDEX", "TROPONINI" in the last 168 hours. BNP (last 3 results) No results for input(s): "PROBNP" in the last 8760 hours. CBG: Recent Labs  Lab 12/14/21 0806 12/14/21 1138 12/14/21 1408  12/14/21 1636 12/14/21 2237  GLUCAP 121* 95 85 170* 218*   D-Dimer: No results for input(s): "DDIMER" in the last 72 hours. Hgb A1c: No results for input(s): "HGBA1C" in the last 72 hours. Lipid Profile: No results for input(s): "CHOL", "HDL", "LDLCALC", "TRIG", "CHOLHDL", "LDLDIRECT" in the last 72 hours. Thyroid function studies: No results for input(s): "TSH", "T4TOTAL", "T3FREE", "THYROIDAB" in the last 72 hours.  Invalid input(s): "FREET3" Anemia work up: No results for input(s): "VITAMINB12", "FOLATE", "FERRITIN", "TIBC", "IRON", "RETICCTPCT" in the last 72 hours. Sepsis Labs: Recent Labs  Lab 12/12/21 1041 12/12/21 1317 12/13/21 0526  WBC 7.5  --  7.4  LATICACIDVEN 1.8  1.7  --    Microbiology Recent Results (from the past 240 hour(s))  Culture, blood (Routine x 2)     Status: None (Preliminary result)   Collection Time: 12/12/21 10:41 AM   Specimen: BLOOD  Result Value Ref Range Status   Specimen Description BLOOD RIGHT [facility]  Final   Special Requests   Final    BOTTLES DRAWN AEROBIC AND ANAEROBIC Blood Culture adequate volume   Culture   Final    NO GROWTH 3 DAYS Performed at [facility], [address] [place], [place] [address]    Report Status PENDING  Incomplete  Culture, blood (Routine x 2)     Status: None (Preliminary result)   Collection Time: 12/12/21  1:18 PM   Specimen: BLOOD  Result Value Ref Range Status   Specimen Description BLOOD BLOOD RIGHT FOREARM  Final   Special Requests   Final    BOTTLES DRAWN AEROBIC AND ANAEROBIC Blood Culture adequate volume   Culture   Final    NO GROWTH 3 DAYS Performed at [facility], [address] [place], [place] [address]    Report Status PENDING  Incomplete  Aerobic/Anaerobic Culture w Gram Stain (surgical/deep wound)     Status: None (Preliminary result)   Collection Time: 12/14/21  1:20 PM   Specimen: PATH Bone resection; Tissue  Result Value Ref Range Status   Specimen Description    Final    ULCER Performed at [facility], [address], [place], [place] [address]    Special Requests   Final    ULCER LEFT FOOT BONE ULCER Performed at [facility], [address] [place], [place] [address]    Gram Stain   Final    NO SQUAMOUS EPITHELIAL CELLS SEEN RARE WBC SEEN NO ORGANISMS SEEN Performed at [facility], [place] [address], [place], [place] [address]    Culture PENDING  Incomplete   Report Status PENDING  Incomplete     Medications:    amLODipine  10 mg Oral Daily   aspirin EC  81 mg Oral Daily   chlorthalidone  25 mg Oral Daily   heparin  5,000 Units Subcutaneous Q8H   insulin aspart  0-5 Units Subcutaneous QHS   insulin aspart  0-9 Units Subcutaneous TID WC   insulin glargine-yfgn  30 Units Subcutaneous QHS   losartan  100 mg Oral Daily   simvastatin  20 mg Oral q1800   Continuous Infusions:  piperacillin-tazobactam (ZOSYN)  IV 3.375 g (12/15/21 0648)      LOS: 3 days   Charlynne Cousins  [facility]  12/15/2021, 7:06 AM

## 2021-12-15 NOTE — Progress Notes (Signed)
PT Cancellation Note  Patient Details Name: [name] MRN: [id number] DOB: 12-Jul-1960   Cancelled Treatment:    Reason Eval/Treat Not Completed: Other (comment). PT consult received and chart reviewed. Per nursing documentation, pt has been indep with mobility tasks and documented to be out of bed. Confirmed with RN that no formal need for PT indicated at this time. Pt is pending angiogram procedure Monday. If PT needs arise, please re-order. Will sign off at this time.   Issabella Rix 12/15/2021, 2:25 PM Greggory Stallion, PT, DPT, GCS [phone]

## 2021-12-16 DIAGNOSIS — L089 Local infection of the skin and subcutaneous tissue, unspecified: Secondary | ICD-10-CM | POA: Diagnosis not present

## 2021-12-16 DIAGNOSIS — E11628 Type 2 diabetes mellitus with other skin complications: Secondary | ICD-10-CM | POA: Diagnosis not present

## 2021-12-16 LAB — GLUCOSE, CAPILLARY
Glucose-Capillary: 90 mg/dL (ref 70–99)
Glucose-Capillary: 123 mg/dL — ABNORMAL HIGH (ref 70–99)
Glucose-Capillary: 138 mg/dL — ABNORMAL HIGH (ref 70–99)
Glucose-Capillary: 206 mg/dL — ABNORMAL HIGH (ref 70–99)

## 2021-12-16 NOTE — TOC Initial Note (Signed)
Transition of Care [facility] - Initial/Assessment Note    Patient Details  Name: [name] MRN: [id number] Date of Birth: [birth year]  Transition of Care [facility]) CM/SW Contact:    Elliot Gurney [place], [place] Phone Number: 12/16/2021, 11:55 AM  Clinical Narrative:                 Met with patient at bedside to discuss discharge needs. Patient resides in his own home with his spouse. Per patient, he understands the need for IV antibiotics at home and is familiar with the process. Vesper contacted and confirms that the referral for home antibiotics has been received. Per patient he has a walker and a cain at home. Frances Maywood is medications through [facility] [employer] [facility]. Vascular in to see patient today-left lower extremity angiogram scheduled for tomorrow. PT pending angiogram procedure tomorrow.  Transition of Care to continue to follow  Ms [facility], LCSW Transition of Care [phone]  Expected Discharge Plan:  ([facility] for IV antibiotics) Barriers to Discharge: Continued Medical Work up   Patient Goals and CMS Choice Patient states their goals for this hospitalization and ongoing recovery are:: 'I am ready to go home"      Expected Discharge Plan and Services Expected Discharge Plan:  ([facility] for IV antibiotics) In-house Referral: Clinical Social Work   Post Acute Care Choice: [place] arrangements for the past 2 months: [place]: IV Antibiotics (Previously arranged-will call to confirm) [place] Agency: Other - See comment ([facility])     Representative spoke with at [place]: Carolynn Sayers  Prior Living Arrangements/Services Living arrangements for the past 2 months: Single Family Home Lives with:: Spouse   Do you feel safe going back to the place where you live?: Yes      Need for Family Participation in Patient Care: No (Comment) Care giver support system in place?: Yes  (comment)   Criminal Activity/Legal Involvement Pertinent to Current Situation/Hospitalization: No - Comment as needed  Activities of Daily Living Home Assistive Devices/Equipment: Cane (specify quad or straight), Crutches, CPAP ADL Screening (condition at time of admission) Patient's cognitive ability adequate to safely complete daily activities?: Yes Is the patient deaf or have difficulty hearing?: No Does the patient have difficulty seeing, even when wearing glasses/contacts?: No Does the patient have difficulty concentrating, remembering, or making decisions?: No Patient able to express need for assistance with ADLs?: Yes Does the patient have difficulty dressing or bathing?: No Independently performs ADLs?: Yes (appropriate for developmental age) Does the patient have difficulty walking or climbing stairs?: No Weakness of Legs: None Weakness of Arms/Hands: None  Permission Sought/Granted                  Emotional Assessment Appearance:: Appears stated age Attitude/Demeanor/Rapport: Engaged, Self-Confident Affect (typically observed): Accepting, Adaptable Orientation: : Oriented to Situation, Oriented to  Time, Oriented to Place, Oriented to Self Alcohol / Substance Use: Not Applicable Psych Involvement: No (comment)  Admission diagnosis:  Diabetic infection of left foot ([place]) [E83.151, L08.9] Patient Active Problem List   Diagnosis Date Noted   Diabetic infection of left foot ([place]) 12/12/2021   OSA (obstructive sleep apnea) 06/18/2021   CPAP use counseling 06/18/2021   Gastroesophageal reflux disease 09/04/2020   BPH (benign prostatic hyperplasia) 09/04/2020   Chronic anticoagulationn (PLAVIX) 03/15/2018   Diabetes mellitus  due to underlying condition, uncontrolled, with diabetic neuropathy 03/10/2018   Type 2 diabetes mellitus with diabetic polyneuropathy, with long-term current use of insulin ([place]) 03/10/2018   Diabetic polyneuropathy associated with type 2  diabetes mellitus ([place]) 03/10/2018   Atherosclerosis of native arteries of the extremities with ulceration ([place]) 02/24/2018   Sleep apnea 02/24/2018   Diabetic peripheral neuropathy ([place]) 02/24/2018   Vitamin D insufficiency 02/24/2018   Neurogenic pain 02/24/2018   Chronic foot pain (Primary Area of Pain) (Bilateral) (L>R) 02/10/2018   Chronic lower extremity pain (Secondary Area of Pain) (Bilateral) (L>R) 02/10/2018   Chronic pain syndrome 02/10/2018   Long term current use of opiate analgesic 02/10/2018   Pharmacologic therapy 02/10/2018   Disorder of skeletal system 02/10/2018   Problems influencing health status 02/10/2018   Bacterial infection due to Morganella morganii 12/26/2017   Sepsis due to Streptococcus, group B ([place]) 12/26/2017   HTN (hypertension) 11/29/2017   HLD (hyperlipidemia) 11/29/2017   Osteomyelitis ([place]) 11/29/2017   Cellulitis of foot (Left) 03/05/2017   Surgery, elective    Hardware complicating wound infection ([place])    Metatarsal stress fracture of left foot 01/12/2016   Diabetic foot infection ([place]) 01/12/2016   Diabetic ulcer of foot associated with type 2 diabetes mellitus, with necrosis of muscle ([place]) 01/11/2016   PCP:  Theotis Burrow, MD Pharmacy:   [facility] [address] [place] - [address] [place] [address] Phone: [phone] Fax: [phone]  [id number] [address] [place]: [phone]: [phone] [facility] [address]: [phone]: [address]Phone: [phone]: [phone]    Social Determinants of Health (SDOH) Interventions    Readmission Risk Interventions     No data to display

## 2021-12-16 NOTE — Progress Notes (Signed)
2 Days Post-Op   Subjective/Chief Complaint: States his LEFT foot feels better. Otherwise without complaint   Objective: Vital signs in last 24 hours: Temp:  [98.4 F (36.9 C)-99.6 F (37.6 C)] 99.6 F (37.6 C) (06/18 0837) Pulse Rate:  [84-89] 84 (06/18 0837) Resp:  [16-20] 16 (06/18 0459) BP: (122-136)/(71-84) 122/76 (06/18 0837) SpO2:  [94 %-98 %] 96 % (06/18 0837) Last BM Date : 12/15/21  Intake/Output from previous day: 06/17 0701 - 06/18 0700 In: 583 [P.O.:240; IV Piggyback:343] Out: 1900 [Urine:1900] Intake/Output this shift: No intake/output data recorded.  General appearance: alert and no distress Resp: clear to auscultation bilaterally Cardio: regular rate and rhythm Extremities: LEFT foot surgical dressing in place, leg warm  Lab Results:  No results for input(s): "WBC", "HGB", "HCT", "PLT" in the last 72 hours. BMET Recent Labs    12/14/21 0602 12/15/21 0419  CREATININE 1.09 1.03   PT/INR No results for input(s): "LABPROT", "INR" in the last 72 hours. ABG No results for input(s): "PHART", "HCO3" in the last 72 hours.  Invalid input(s): "PCO2", "PO2"  Studies/Results: No results found.  Anti-infectives: Anti-infectives (From admission, onward)    Start     Dose/Rate Route Frequency Ordered Stop   12/14/21 1337  gentamicin (GARAMYCIN) injection  Status:  Discontinued          As needed 12/14/21 1337 12/14/21 1401   12/14/21 1337  vancomycin (VANCOCIN) powder  Status:  Discontinued          As needed 12/14/21 1337 12/14/21 1401   12/13/21 0600  vancomycin (VANCOCIN) IVPB 1000 mg/200 mL premix  Status:  Discontinued        1,000 mg 200 mL/hr over 60 Minutes Intravenous Every 12 hours 12/12/21 1746 12/13/21 1647   12/13/21 0600  piperacillin-tazobactam (ZOSYN) IVPB 3.375 g        3.375 g 12.5 mL/hr over 240 Minutes Intravenous Every 8 hours 12/13/21 0334     12/12/21 1930  piperacillin-tazobactam (ZOSYN) IVPB 3.375 g  Status:  Discontinued         3.375 g 12.5 mL/hr over 240 Minutes Intravenous Every 8 hours 12/12/21 1609 12/13/21 0334   12/12/21 1800  vancomycin (VANCOREADY) IVPB 1500 mg/300 mL       See Hyperspace for full Linked Orders Report.   1,500 mg 150 mL/hr over 120 Minutes Intravenous  Once 12/12/21 1609 12/12/21 2021   12/12/21 1700  vancomycin (VANCOCIN) IVPB 1000 mg/200 mL premix       See Hyperspace for full Linked Orders Report.   1,000 mg 200 mL/hr over 60 Minutes Intravenous  Once 12/12/21 1609 12/12/21 2133   12/12/21 1500  vancomycin (VANCOREADY) IVPB 1500 mg/300 mL  Status:  Discontinued       See Hyperspace for full Linked Orders Report.   1,500 mg 150 mL/hr over 120 Minutes Intravenous  Once 12/12/21 1338 12/12/21 1348   12/12/21 1345  vancomycin (VANCOCIN) IVPB 1000 mg/200 mL premix  Status:  Discontinued       See Hyperspace for full Linked Orders Report.   1,000 mg 200 mL/hr over 60 Minutes Intravenous  Once 12/12/21 1338 12/12/21 1348   12/12/21 1330  vancomycin (VANCOCIN) 2,500 mg in sodium chloride 0.9 % 500 mL IVPB  Status:  Discontinued        2,500 mg 262.5 mL/hr over 120 Minutes Intravenous  Once 12/12/21 1328 12/12/21 1329   12/12/21 1315  piperacillin-tazobactam (ZOSYN) IVPB 3.375 g  Status:  Discontinued  3.375 g 100 mL/hr over 30 Minutes Intravenous  Once 12/12/21 1312 12/12/21 1439       Assessment/Plan: Osteomyelitis, non healing wound LEFT foot PAD Plan for LEFT lower extremity angiogram tomorrow - discussed with patient , no immediate questions. NPO after MN  Consent  LOS: 4 days    Jamesetta So A 12/16/2021

## 2021-12-16 NOTE — Progress Notes (Signed)
[facility] PROGRESS NOTE    Progress Note  [name]  LTJ:[id number] DOB: May 12, 1961 DOA: 12/12/2021 PCP: Theotis Burrow, MD     Brief Narrative:   [name] is an 61 y.o. male past medical history of insulin-dependent diabetes mellitus type 2 with peripheral neuropathy, peripheral vascular disease status post multiple toe amputations who was sent to the ED on 12/12/2021 from the podiatrist office due to worsening ulcer with evidence of tracking and deep probing to the plantar aspect of the left forefoot.  MRI revealed osteomyelitis of the cuboid and fourth metatarsal for which I&D does plan and performed on 12/14/2021, ID was consulted for recommendations.    Assessment/Plan:   Diabetic infection of left foot ([place]) Dr. Vickki Muff perform I&D in [place] on 12/14/2021. Currently on IV Zosyn, further antibiotic management per ID. ABIs on the right appear essentially unchanged from prior studies, left ABI appears mildly decreased compared to prior studies.  Peripheral arterial disease: ABI showed reduced flow in the left side probably contributing to poor wound healing. Vascular surgery has been consulted they are planning for an angiogram on 12/15/2021. Continue aspirin and statins.  Insulin-dependent diabetes mellitus type 2: Continue long-acting insulin plus sliding scale blood glucose fairly controlled.  Essential HTN (hypertension) Continue Norvasc hydrochlorothiazide and losartan. We will have to hold hydrochlorothiazide prior to angiogram.  Hyperlipidemia: Continue statins.  Stress radiograph negative Continue CPAP at night.  Obesity with a BMI greater than 30. Counseling.   DVT prophylaxis: lovenox Family Communication:None Status is: Inpatient Remains inpatient appropriate because: Diabetic foot infection.    Code Status:     Code Status Orders  (From admission, onward)           Start     Ordered   12/12/21 1336  Full code  Continuous         12/12/21 1347           Code Status History     Date Active Date Inactive Code Status Order ID Comments User Context   04/01/2019 2239 04/04/2019 1414 Full Code [id number]  Lance Coon, MD ED   03/12/2018 0956 03/12/2018 1531 Full Code [id number]  Algernon Huxley, MD Inpatient   11/29/2017 0218 12/05/2017 1511 Full Code [id number]  Lance Coon, MD Inpatient   03/05/2017 1831 03/07/2017 1914 Full Code [id number]  Hillary Bow, MD ED   01/11/2016 1845 01/15/2016 1916 Full Code [id number]  Wylene Simmer, MD Inpatient         IV Access:   Peripheral IV   Procedures and diagnostic studies:   No results found.   Medical Consultants:   None.   Subjective:    [name] has no new complaints today  Objective:    Vitals:   12/15/21 1612 12/15/21 2053 12/16/21 0459 12/16/21 0837  BP: 125/71 136/84 128/74 122/76  Pulse: 89 88 85 84  Resp: '18 20 16   '$ Temp: 98.8 F (37.1 C) 98.4 F (36.9 C) 98.5 F (36.9 C) 99.6 F (37.6 C)  TempSrc:  Oral Oral Oral  SpO2: 98% 94% 96% 96%  Weight:      Height:       SpO2: 96 % O2 Flow Rate (L/min): 6 L/min   Intake/Output Summary (Last 24 hours) at 12/16/2021 0841 Last data filed at 12/16/2021 0500 Gross per 24 hour  Intake 582.95 ml  Output 1900 ml  Net -1317.05 ml    Filed Weights   12/12/21 1039  Weight: 113.4 kg  Exam: General exam: In no acute distress. Respiratory system: Good air movement and clear to auscultation. Cardiovascular system: S1 & S2 heard, RRR. No JVD. Gastrointestinal system: Abdomen is nondistended, soft and nontender.  Extremities: No pedal edema. Skin: No rashes, lesions or ulcers Psychiatry: Judgement and insight appear normal. Mood & affect appropriate.   Data Reviewed:    Labs: Basic Metabolic Panel: Recent Labs  Lab 12/12/21 1041 12/13/21 0526 12/14/21 0602 12/15/21 0419  NA 134*  --   --   --   K 3.7  --   --   --   CL 100  --   --   --   CO2 26  --   --   --   GLUCOSE  193*  --   --   --   BUN 22*  --   --   --   CREATININE 0.92 1.03 1.09 1.03  CALCIUM 9.1  --   --   --     GFR Estimated Creatinine Clearance: 96.2 mL/min (by C-G formula based on SCr of 1.03 mg/dL). Liver Function Tests: Recent Labs  Lab 12/12/21 1041  AST 20  ALT 20  ALKPHOS 88  BILITOT 0.6  PROT 8.0  ALBUMIN 3.9    No results for input(s): "LIPASE", "AMYLASE" in the last 168 hours. No results for input(s): "AMMONIA" in the last 168 hours. Coagulation profile Recent Labs  Lab 12/12/21 1041  INR 0.9    COVID-19 Labs  No results for input(s): "DDIMER", "FERRITIN", "LDH", "CRP" in the last 72 hours.   Lab Results  Component Value Date   [place] NEGATIVE 04/01/2019    CBC: Recent Labs  Lab 12/12/21 1041 12/13/21 0526  WBC 7.5 7.4  NEUTROABS 5.1  --   HGB 14.2 13.9  HCT 43.3 41.9  MCV 91.7 91.9  PLT 279 266    Cardiac Enzymes: No results for input(s): "CKTOTAL", "CKMB", "CKMBINDEX", "TROPONINI" in the last 168 hours. BNP (last 3 results) No results for input(s): "PROBNP" in the last 8760 hours. CBG: Recent Labs  Lab 12/15/21 0912 12/15/21 1215 12/15/21 1653 12/15/21 2139 12/16/21 0747  GLUCAP 210* 157* 175* 130* 90    D-Dimer: No results for input(s): "DDIMER" in the last 72 hours. Hgb A1c: No results for input(s): "HGBA1C" in the last 72 hours. Lipid Profile: No results for input(s): "CHOL", "HDL", "LDLCALC", "TRIG", "CHOLHDL", "LDLDIRECT" in the last 72 hours. Thyroid function studies: No results for input(s): "TSH", "T4TOTAL", "T3FREE", "THYROIDAB" in the last 72 hours.  Invalid input(s): "FREET3" Anemia work up: No results for input(s): "VITAMINB12", "FOLATE", "FERRITIN", "TIBC", "IRON", "RETICCTPCT" in the last 72 hours. Sepsis Labs: Recent Labs  Lab 12/12/21 1041 12/12/21 1317 12/13/21 0526  WBC 7.5  --  7.4  LATICACIDVEN 1.8 1.7  --     Microbiology Recent Results (from the past 240 hour(s))  Culture, blood (Routine x  2)     Status: None (Preliminary result)   Collection Time: 12/12/21 10:41 AM   Specimen: BLOOD  Result Value Ref Range Status   Specimen Description BLOOD RIGHT [facility]  Final   Special Requests   Final    BOTTLES DRAWN AEROBIC AND ANAEROBIC Blood Culture adequate volume   Culture   Final    NO GROWTH 4 DAYS Performed at [facility], [address] [place], [place] [address]    Report Status PENDING  Incomplete  Culture, blood (Routine x 2)     Status: None (Preliminary result)   Collection  Time: 12/12/21  1:18 PM   Specimen: BLOOD  Result Value Ref Range Status   Specimen Description BLOOD BLOOD RIGHT FOREARM  Final   Special Requests   Final    BOTTLES DRAWN AEROBIC AND ANAEROBIC Blood Culture adequate volume   Culture   Final    NO GROWTH 4 DAYS Performed at [facility], [address] [place], [place] [address]    Report Status PENDING  Incomplete  Aerobic/Anaerobic Culture w Gram Stain (surgical/deep wound)     Status: None (Preliminary result)   Collection Time: 12/14/21  1:20 PM   Specimen: PATH Bone resection; Tissue  Result Value Ref Range Status   Specimen Description   Final    ULCER Performed at [facility], [address], [place], [place] [address]    Special Requests   Final    ULCER LEFT FOOT BONE ULCER Performed at [facility], [address] [place], [place] [address]    Gram Stain   Final    NO SQUAMOUS EPITHELIAL CELLS SEEN RARE WBC SEEN NO ORGANISMS SEEN    Culture   Final    NO GROWTH < 24 HOURS Performed at [facility], [place] [address], [place], [place] [address]    Report Status PENDING  Incomplete     Medications:    amLODipine  10 mg Oral Daily   aspirin EC  81 mg Oral Daily   chlorthalidone  25 mg Oral Daily   heparin  5,000 Units Subcutaneous Q8H   insulin aspart  0-5 Units Subcutaneous QHS   insulin aspart  0-9 Units Subcutaneous TID WC   insulin aspart  3 Units Subcutaneous TID WC    insulin glargine-yfgn  15 Units Subcutaneous BID   losartan  100 mg Oral Daily   simvastatin  20 mg Oral q1800   Continuous Infusions:  piperacillin-tazobactam (ZOSYN)  IV 3.375 g (12/16/21 0556)      LOS: 4 days   Charlynne Cousins  [facility]  12/16/2021, 8:41 AM

## 2021-12-17 ENCOUNTER — Encounter
Admission: EM | Disposition: A | Discharge status: Home Health | Payer: Self-pay | Source: Home / Self Care | Attending: Internal Medicine

## 2021-12-17 ENCOUNTER — Encounter: Payer: Self-pay | Admitting: Podiatry

## 2021-12-17 DIAGNOSIS — E1142 Type 2 diabetes mellitus with diabetic polyneuropathy: Secondary | ICD-10-CM | POA: Diagnosis not present

## 2021-12-17 DIAGNOSIS — G4733 Obstructive sleep apnea (adult) (pediatric): Secondary | ICD-10-CM | POA: Diagnosis not present

## 2021-12-17 DIAGNOSIS — I70245 Atherosclerosis of native arteries of left leg with ulceration of other part of foot: Secondary | ICD-10-CM

## 2021-12-17 DIAGNOSIS — E11628 Type 2 diabetes mellitus with other skin complications: Secondary | ICD-10-CM | POA: Diagnosis not present

## 2021-12-17 DIAGNOSIS — I10 Essential (primary) hypertension: Secondary | ICD-10-CM | POA: Diagnosis not present

## 2021-12-17 DIAGNOSIS — L089 Local infection of the skin and subcutaneous tissue, unspecified: Secondary | ICD-10-CM | POA: Diagnosis not present

## 2021-12-17 HISTORY — PX: LOWER EXTREMITY ANGIOGRAPHY: CATH118251

## 2021-12-17 LAB — SURGICAL PATHOLOGY

## 2021-12-17 LAB — GLUCOSE, CAPILLARY
Glucose-Capillary: 143 mg/dL — ABNORMAL HIGH (ref 70–99)
Glucose-Capillary: 139 mg/dL — ABNORMAL HIGH (ref 70–99)
Glucose-Capillary: 110 mg/dL — ABNORMAL HIGH (ref 70–99)
Glucose-Capillary: 219 mg/dL — ABNORMAL HIGH (ref 70–99)

## 2021-12-17 SURGERY — LOWER EXTREMITY ANGIOGRAPHY
Anesthesia: Moderate Sedation | Laterality: Left

## 2021-12-17 MED ORDER — SODIUM CHLORIDE 0.9 % IV SOLN: INTRAVENOUS | Status: DC

## 2021-12-17 MED ORDER — MIDAZOLAM HCL 2 MG/2ML IJ SOLN
INTRAMUSCULAR | Status: DC | PRN
  Administered 2021-12-17: 2 mg via INTRAVENOUS

## 2021-12-17 MED ORDER — FENTANYL CITRATE (PF) 100 MCG/2ML IJ SOLN
INTRAMUSCULAR | Status: DC | PRN
  Administered 2021-12-17: 50 ug via INTRAVENOUS

## 2021-12-17 MED ORDER — HEPARIN SODIUM (PORCINE) 1000 UNIT/ML IJ SOLN
INTRAMUSCULAR | Status: DC | PRN
  Administered 2021-12-17: 4000 [IU] via INTRAVENOUS

## 2021-12-17 MED ORDER — MIDAZOLAM HCL 2 MG/2ML IJ SOLN
INTRAMUSCULAR | Status: AC
  Filled 2021-12-17: qty 2

## 2021-12-17 MED ORDER — FENTANYL CITRATE (PF) 100 MCG/2ML IJ SOLN
INTRAMUSCULAR | Status: AC
  Filled 2021-12-17: qty 2

## 2021-12-17 MED ORDER — MIDAZOLAM HCL 2 MG/2ML IJ SOLN
INTRAMUSCULAR | Status: AC
  Filled 2021-12-17: qty 4

## 2021-12-17 MED ORDER — HEPARIN SODIUM (PORCINE) 1000 UNIT/ML IJ SOLN
INTRAMUSCULAR | Status: AC
  Filled 2021-12-17: qty 10

## 2021-12-17 MED ORDER — IODIXANOL 320 MG/ML IV SOLN
INTRAVENOUS | Status: DC | PRN
  Administered 2021-12-17: 40 mL via INTRA_ARTERIAL

## 2021-12-17 SURGICAL SUPPLY — 20 items
BALLN ULTRVRSE 3X80X150 (BALLOONS) ×1
BALLN ULTRVRSE 3X80X150 OTW (BALLOONS) ×1
BALLOON ULTRVRSE 3X80X150 OTW (BALLOONS) IMPLANT
CATH ANGIO 5F PIGTAIL 65CM (CATHETERS) ×1 IMPLANT
CATH BEACON 5 .035 100 C2 TIP (CATHETERS) ×1 IMPLANT
CATH BEACON 5 .035 100 JB1 TIP (CATHETERS) ×1 IMPLANT
CATH BEACON 5 .035 100 JB2 TIP (CATHETERS) ×1 IMPLANT
CATH INFINITI JR4 5F (CATHETERS) ×1 IMPLANT
CATH VERT 5X100 (CATHETERS) ×1 IMPLANT
COVER PROBE U/S 5X48 (MISCELLANEOUS) ×1 IMPLANT
DEVICE STARCLOSE SE CLOSURE (Vascular Products) ×1 IMPLANT
GLIDEWIRE ADV .035X260CM (WIRE) ×1 IMPLANT
GUIDEWIRE PFTE-COATED .018X300 (WIRE) ×1 IMPLANT
KIT ENCORE 26 ADVANTAGE (KITS) ×1 IMPLANT
PACK ANGIOGRAPHY (CUSTOM PROCEDURE TRAY) ×2 IMPLANT
SHEATH BRITE TIP 5FRX11 (SHEATH) ×1 IMPLANT
SHEATH RAABE 6FRX70 (SHEATH) ×1 IMPLANT
SYR MEDRAD MARK 7 150ML (SYRINGE) ×1 IMPLANT
TUBING CONTRAST HIGH PRESS 72 (TUBING) ×1 IMPLANT
WIRE GUIDERIGHT .035X150 (WIRE) ×1 IMPLANT

## 2021-12-17 NOTE — Op Note (Signed)
[facility]  Percutaneous Study/Intervention Procedural Note   Date of Surgery: 12/17/2021  Surgeon(s):Donnalyn Juran    Assistants:none  Pre-operative Diagnosis: PAD with ulceration LLE  Post-operative diagnosis:  Same  Procedure(s) Performed:             1.  Ultrasound guidance for vascular access right femoral artery             2.  Catheter placement into left SFA from right femoral approach             3.  Aortogram and selective left lower extremity angiogram             4.  StarClose closure device right femoral artery  EBL: 25 cc  Contrast: 40 cc  Fluoro Time: 20 minutes  Moderate Conscious Sedation Time: approximately 60 minutes using 2 mg of Versed and 50 mcg of Fentanyl              Indications:  Patient is a 61 y.o.male with osteomyelitis and ulceration of the left foot. The patient has noninvasive study showing a left ABI of 0.8. The patient is brought in for angiography for further evaluation and potential treatment.  Due to the limb threatening nature of the situation, angiogram was performed for attempted limb salvage. The patient is aware that if the procedure fails, amputation would be expected.  The patient also understands that even with successful revascularization, amputation may still be required due to the severity of the situation.  Risks and benefits are discussed and informed consent is obtained.   Procedure:  The patient was identified and appropriate procedural time out was performed.  The patient was then placed supine on the table and prepped and draped in the usual sterile fashion. Moderate conscious sedation was administered during a face to face encounter with the patient throughout the procedure with my supervision of the RN administering medicines and monitoring the patient's vital signs, pulse oximetry, telemetry and mental status throughout from the start of the procedure until the patient was taken to the recovery room.  Ultrasound was used to evaluate the right common femoral artery.  It was patent .  A digital ultrasound image was acquired.  A Seldinger needle was used to access the right common femoral artery under direct ultrasound guidance and a permanent image was performed.  A 0.035 J wire was advanced without resistance and a 5Fr sheath was placed.  Pigtail catheter was placed into the aorta and an AP aortogram was performed. I then crossed the aortic bifurcation and advanced to the left femoral head.  Catheter was then advanced into the left SFA to help opacify distally due to slow circulation time.  Selective left lower extremity angiogram was then performed. This demonstrated fairly normal common femoral artery, pruned and poorly collateralized left profunda femoris artery, but normal SFA and popliteal arteries.  There was then an occlusion of the anterior tibial artery about 5 cm beyond its origin.  The peroneal artery and posterior tibial arteries were both patent distally, but there is about a 70% stenosis at the origin of the tibioperoneal trunk which was a short vessel of only about 1 to 2 cm.  This had a very sharp angulation. It was felt that it was in the patient's best interest to proceed with intervention after these images to avoid a second procedure and a larger amount of contrast and fluoroscopy based off of the findings from the initial angiogram. The patient was systemically heparinized and  a 6 French 70 cm Ansell sheath was then placed over the [employer] wire. I then used a Kumpe catheter and the advantage wire to get down into the distal popliteal artery and attempt to cross the tibioperoneal trunk stenosis.  The steep angle and the small vessel made this a very difficult cannulation and a variety of catheters occluding a JB 1 catheter, a JB2 catheter, headhunter catheter, JR4 catheter, C2 catheter were all attempted as were 0.018 advantage wires and 0.035 advantage wires.  I could never get the  wire and catheter to get down into the tibioperoneal trunk from this approach.  After 20 minutes of fluoroscopy, it was clear this was going to be unsuccessful endeavor and we can consider a posterior tibial approach for revascularization in the future. I elected to terminate the procedure. The sheath was removed and StarClose closure device was deployed in the right femoral artery with excellent hemostatic result. The patient was taken to the recovery room in stable condition having tolerated the procedure well.  Findings:               Aortogram:   This demonstrated normal renal arteries and normal aorta and iliac segments without significant stenosis.             Left lower Extremity:  This demonstrated fairly normal common femoral artery, pruned and poorly collateralized left profunda femoris artery, but normal SFA and popliteal arteries.  There was then an occlusion of the anterior tibial artery about 5 cm beyond its origin.  The peroneal artery and posterior tibial arteries were both patent distally, but there is about a 70% stenosis at the origin of the tibioperoneal trunk which was a short vessel of only about 1 to 2 cm.  This had a very sharp angulation.   Disposition: Patient was taken to the recovery room in stable condition having tolerated the procedure well.  Complications: None  Leotis Pain 12/17/2021 5:49 PM   This note was created with Dragon Medical transcription system. Any errors in dictation are purely unintentional.

## 2021-12-17 NOTE — Progress Notes (Signed)
ID Pt back from agio Doing fine  O/e awake and alert BP (!) 152/83 (BP Location: Left Arm)   Pulse (!) 101   Temp 98.3 F (36.8 C) (Oral)   Resp 18   Ht $R'5\' 10"'lw$  (1.778 m)   Wt 113.4 kg   SpO2 99%   BMI 35.87 kg/m   Chest /l air entry Hss1s Abd soft Left foot Surgical wound on the lateral margin- bloody purulent discharge       Labs ESR 48 CRP 2.6   Impression/recommendation  DM with peripheral neuropathy with foot infection left. Recurrent ulceration of the left 5th met area. H/o previous resection of the proximal third of met  Underwent debridement to the bone of cuboid and 4th met Bone biopsy and placement of beads on 12/14/21 Culture and pathology pending Pt on zosyn- will need for 6 weeks Discussed the management with the patient

## 2021-12-17 NOTE — Progress Notes (Signed)
[facility] PROGRESS NOTE    Progress Note  [name]  HUT:[id number] DOB: [birth year] DOA: 12/12/2021 PCP: Theotis Burrow, MD     Brief Narrative:   [name] is an 61 y.o. male past medical history of insulin-dependent diabetes mellitus type 2 with peripheral neuropathy, peripheral vascular disease status post multiple toe amputations who was sent to the ED on 12/12/2021 from the podiatrist office due to worsening ulcer with evidence of tracking and deep probing to the plantar aspect of the left forefoot.  MRI revealed osteomyelitis of the cuboid and fourth metatarsal for which I&D does plan and performed on 12/14/2021, ID was consulted for recommendations.   Assessment/Plan:   Diabetic infection of left foot ([place]) Dr. Vickki Muff perform I&D in [place] on 12/14/2021. Currently on IV Zosyn, further antibiotic management per ID. ABIs on the right appear essentially unchanged from prior studies, left ABI appears mildly decreased compared to prior studies.  Peripheral arterial disease: ABI showed reduced flow in the left side probably contributing to poor wound healing. Keep the patient n.p.o. for angiogram, vascular surgery has been consulted they are planning for an angiogram on 12/17/2021. Continue aspirin and statins.  Insulin-dependent diabetes mellitus type 2: Continue long-acting insulin plus sliding scale blood glucose fairly controlled.  Essential HTN (hypertension) Continue Norvasc hydrochlorothiazide and losartan. We will have to hold hydrochlorothiazide prior to angiogram.  Hyperlipidemia: Continue statins.  Stress radiograph negative Continue CPAP at night.  Obesity with a BMI greater than 30. Counseling.   DVT prophylaxis: lovenox Family Communication:None Status is: Inpatient Remains inpatient appropriate because: Diabetic foot infection.    Code Status:     Code Status Orders  (From admission, onward)           Start     Ordered    12/12/21 1336  Full code  Continuous        12/12/21 1347           Code Status History     Date Active Date Inactive Code Status Order ID Comments User Context   04/01/2019 2239 04/04/2019 1414 Full Code [id number]  Lance Coon, MD ED   03/12/2018 0956 03/12/2018 1531 Full Code [id number]  Algernon Huxley, MD Inpatient   11/29/2017 0218 12/05/2017 1511 Full Code [id number]  Lance Coon, MD Inpatient   03/05/2017 1831 03/07/2017 1914 Full Code [id number]  Hillary Bow, MD ED   01/11/2016 1845 01/15/2016 1916 Full Code [id number]  Wylene Simmer, MD Inpatient         IV Access:   Peripheral IV   Procedures and diagnostic studies:   No results found.   Medical Consultants:   None.   Subjective:    [name] has no complaints today.  Objective:    Vitals:   12/15/21 2053 12/16/21 0459 12/16/21 0837 12/17/21 0809  BP: 136/84 128/74 122/76 (!) 146/75  Pulse: 88 85 84 90  Resp: '20 16  18  '$ Temp: 98.4 F (36.9 C) 98.5 F (36.9 C) 99.6 F (37.6 C) 98.3 F (36.8 C)  TempSrc: Oral Oral Oral Oral  SpO2: 94% 96% 96% 98%  Weight:      Height:       SpO2: 98 % O2 Flow Rate (L/min): 6 L/min   Intake/Output Summary (Last 24 hours) at 12/17/2021 0931 Last data filed at 12/17/2021 0607 Gross per 24 hour  Intake --  Output 500 ml  Net -500 ml    Filed Weights   12/12/21 1039  Weight:  113.4 kg    Exam: General exam: In no acute distress. Respiratory system: Good air movement and clear to auscultation. Cardiovascular system: S1 & S2 heard, RRR. No JVD. Gastrointestinal system: Abdomen is nondistended, soft and nontender.  Skin: No rashes, lesions or ulcers Psychiatry: Judgement and insight appear normal. Mood & affect appropriate.   Data Reviewed:    Labs: Basic Metabolic Panel: Recent Labs  Lab 12/12/21 1041 12/13/21 0526 12/14/21 0602 12/15/21 0419  NA 134*  --   --   --   K 3.7  --   --   --   CL 100  --   --   --   CO2 26  --   --   --   GLUCOSE  193*  --   --   --   BUN 22*  --   --   --   CREATININE 0.92 1.03 1.09 1.03  CALCIUM 9.1  --   --   --     GFR Estimated Creatinine Clearance: 96.2 mL/min (by C-G formula based on SCr of 1.03 mg/dL). Liver Function Tests: Recent Labs  Lab 12/12/21 1041  AST 20  ALT 20  ALKPHOS 88  BILITOT 0.6  PROT 8.0  ALBUMIN 3.9    No results for input(s): "LIPASE", "AMYLASE" in the last 168 hours. No results for input(s): "AMMONIA" in the last 168 hours. Coagulation profile Recent Labs  Lab 12/12/21 1041  INR 0.9    COVID-19 Labs  No results for input(s): "DDIMER", "FERRITIN", "LDH", "CRP" in the last 72 hours.   Lab Results  Component Value Date   [place] NEGATIVE 04/01/2019    CBC: Recent Labs  Lab 12/12/21 1041 12/13/21 0526  WBC 7.5 7.4  NEUTROABS 5.1  --   HGB 14.2 13.9  HCT 43.3 41.9  MCV 91.7 91.9  PLT 279 266    Cardiac Enzymes: No results for input(s): "CKTOTAL", "CKMB", "CKMBINDEX", "TROPONINI" in the last 168 hours. BNP (last 3 results) No results for input(s): "PROBNP" in the last 8760 hours. CBG: Recent Labs  Lab 12/16/21 0747 12/16/21 1136 12/16/21 1658 12/16/21 2001 12/17/21 0806  GLUCAP 90 123* 138* 206* 143*    D-Dimer: No results for input(s): "DDIMER" in the last 72 hours. Hgb A1c: No results for input(s): "HGBA1C" in the last 72 hours. Lipid Profile: No results for input(s): "CHOL", "HDL", "LDLCALC", "TRIG", "CHOLHDL", "LDLDIRECT" in the last 72 hours. Thyroid function studies: No results for input(s): "TSH", "T4TOTAL", "T3FREE", "THYROIDAB" in the last 72 hours.  Invalid input(s): "FREET3" Anemia work up: No results for input(s): "VITAMINB12", "FOLATE", "FERRITIN", "TIBC", "IRON", "RETICCTPCT" in the last 72 hours. Sepsis Labs: Recent Labs  Lab 12/12/21 1041 12/12/21 1317 12/13/21 0526  WBC 7.5  --  7.4  LATICACIDVEN 1.8 1.7  --     Microbiology Recent Results (from the past 240 hour(s))  Culture, blood (Routine x  2)     Status: None (Preliminary result)   Collection Time: 12/12/21 10:41 AM   Specimen: BLOOD  Result Value Ref Range Status   Specimen Description BLOOD RIGHT [facility]  Final   Special Requests   Final    BOTTLES DRAWN AEROBIC AND ANAEROBIC Blood Culture adequate volume   Culture   Final    NO GROWTH 4 DAYS Performed at [facility], [address] [place], [place] [address]    Report Status PENDING  Incomplete  Culture, blood (Routine x 2)     Status: None (Preliminary result)  Collection Time: 12/12/21  1:18 PM   Specimen: BLOOD  Result Value Ref Range Status   Specimen Description BLOOD BLOOD RIGHT FOREARM  Final   Special Requests   Final    BOTTLES DRAWN AEROBIC AND ANAEROBIC Blood Culture adequate volume   Culture   Final    NO GROWTH 4 DAYS Performed at [facility], [address] [place], [place] [address]    Report Status PENDING  Incomplete  Aerobic/Anaerobic Culture w Gram Stain (surgical/deep wound)     Status: None (Preliminary result)   Collection Time: 12/14/21  1:20 PM   Specimen: PATH Bone resection; Tissue  Result Value Ref Range Status   Specimen Description   Final    ULCER Performed at [facility], [address], [place], [place] [address]    Special Requests   Final    ULCER LEFT FOOT BONE ULCER Performed at [facility], [place], [place] [address]    Gram Stain   Final    NO SQUAMOUS EPITHELIAL CELLS SEEN RARE WBC SEEN NO ORGANISMS SEEN    Culture   Final    NO GROWTH 2 DAYS NO ANAEROBES ISOLATED; CULTURE IN PROGRESS FOR 5 DAYS Performed at [place] [facility] [address], [place], [place] [address]    Report Status PENDING  Incomplete     Medications:    amLODipine  10 mg Oral Daily   aspirin EC  81 mg Oral Daily   chlorthalidone  25 mg Oral Daily   heparin  5,000 Units Subcutaneous Q8H   insulin aspart  0-5 Units Subcutaneous QHS   insulin aspart  0-9 Units Subcutaneous TID  WC   insulin aspart  3 Units Subcutaneous TID WC   insulin glargine-yfgn  15 Units Subcutaneous BID   losartan  100 mg Oral Daily   simvastatin  20 mg Oral q1800   Continuous Infusions:  piperacillin-tazobactam (ZOSYN)  IV 3.375 g (12/17/21 0607)      LOS: 5 days   Charlynne Cousins  [facility]  12/17/2021, 9:31 AM

## 2021-12-17 NOTE — Progress Notes (Signed)
Patient setup on CPAP for vascular procedure

## 2021-12-17 NOTE — Treatment Plan (Signed)
Diagnosis: Left foot infection Baseline Creatinine <1  Culture Result: Morganella  Allergies  Allergen Reactions   Sulfamethoxazole-Trimethoprim Hives and Rash    OPAT Orders Zosyn 3.375 grams IV Q 6 or 14 grams in 24 hours as IV PB Duration: 6 weeks End Date:01/22/22   [facility] Care Per Protocol:  Labs weekly while on IV antibiotics: _X_ CBC with differential __X_ CMP _X_ CRP X__ ESR   __X Please pull PIC at completion of IV antibiotics   Fax weekly labs to Ameya Kutz [phone]  Clinic Follow Up Appt: 01/08/22 at 10.30Am   Call [phone] with nay questions

## 2021-12-18 ENCOUNTER — Inpatient Hospital Stay: Payer: Self-pay

## 2021-12-18 ENCOUNTER — Encounter: Payer: Self-pay | Admitting: Vascular Surgery

## 2021-12-18 DIAGNOSIS — L03116 Cellulitis of left lower limb: Secondary | ICD-10-CM | POA: Diagnosis not present

## 2021-12-18 DIAGNOSIS — E1142 Type 2 diabetes mellitus with diabetic polyneuropathy: Secondary | ICD-10-CM | POA: Diagnosis not present

## 2021-12-18 DIAGNOSIS — E11628 Type 2 diabetes mellitus with other skin complications: Secondary | ICD-10-CM | POA: Diagnosis not present

## 2021-12-18 DIAGNOSIS — I10 Essential (primary) hypertension: Secondary | ICD-10-CM | POA: Diagnosis not present

## 2021-12-18 LAB — GLUCOSE, CAPILLARY
Glucose-Capillary: 190 mg/dL — ABNORMAL HIGH (ref 70–99)
Glucose-Capillary: 225 mg/dL — ABNORMAL HIGH (ref 70–99)

## 2021-12-18 LAB — CULTURE, BLOOD (ROUTINE X 2)
Culture: NO GROWTH
Culture: NO GROWTH
Special Requests: ADEQUATE
Special Requests: ADEQUATE

## 2021-12-18 MED ORDER — PIPERACILLIN-TAZOBACTAM IV (FOR PTA / DISCHARGE USE ONLY): 13.5000 g | INTRAVENOUS | 0 refills | Status: AC

## 2021-12-18 MED ORDER — PIPERACILLIN-TAZOBACTAM 3.375 G IVPB: 3.3750 g | Freq: Three times a day (TID) | INTRAVENOUS | Status: DC

## 2021-12-18 MED ORDER — SODIUM CHLORIDE 0.9% FLUSH: 10.0000 mL | INTRAVENOUS | Status: DC | PRN

## 2021-12-18 MED ORDER — CHLORHEXIDINE GLUCONATE CLOTH 2 % EX PADS: 6.0000 | MEDICATED_PAD | Freq: Every day | CUTANEOUS | Status: DC

## 2021-12-18 MED ORDER — SODIUM CHLORIDE 0.9% FLUSH: 10.0000 mL | Freq: Two times a day (BID) | INTRAVENOUS | Status: DC

## 2021-12-18 NOTE — Discharge Summary (Addendum)
Physician Discharge Summary  [name] LNL:[id number] DOB: 05-Jul-1960 DOA: 12/12/2021  PCP: Theotis Burrow, MD  Admit date: 12/12/2021 Discharge date: 12/18/2021  Admitted From: [facility] Disposition:  home  Recommendations for Outpatient Follow-up:  Follow up with vascular surgery in 1-2 weeks for pedal approach to due to renal disease to be done as an outpatient. Please obtain BMP/CBC weekly while on IV antibiotics.   Home Health:Yes Equipment/Devices:None  Discharge Condition:Stable CODE STATUS:Full Diet recommendation: Heart Healthy  Brief/Interim Summary: 61 y.o. male past medical history of insulin-dependent diabetes mellitus type 2 with peripheral neuropathy, peripheral vascular disease status post multiple toe amputations who was sent to the ED on 12/12/2021 from the podiatrist office due to worsening ulcer with evidence of tracking and deep probing to the plantar aspect of the left forefoot.  MRI revealed osteomyelitis of the cuboid and fourth metatarsal for which I&D does plan and performed on 12/14/2021, ID was consulted for recommendations.  Discharge Diagnoses:  Principal Problem:   Diabetic infection of left foot ([place]) Active Problems:   HTN (hypertension)   HLD (hyperlipidemia)   Type 2 diabetes mellitus with diabetic polyneuropathy, with long-term current use of insulin (HCC)   OSA (obstructive sleep apnea)  Diabetic left foot infection/Chronic osteomyelitis of left cuboid and fourth metatarsal: Podiatry was consulted Dr. Vickki Muff perform I&D in the OR on 12/14/2021. ABIs were done that showed decreased perfusion to the left. Vascular surgery was consulted to perform angiogram that showed severe tibial disease. ID was consulted and recommended he will need 6 weeks of IV Zosyn as an outpatient. PICC line was inserted on 12/18/2021.  Peripheral arterial disease: Reduced ABI on the left vascular surgery was consulted and perform angiogram. They recommended to  continue aspirin and statins.  Diabetes mellitus type 2: Continue current management no changes made to his medication.  Essential hypertension: Continue current regimen no changes made to his medication.  Hyperlipidemia:  continue statins.  Obstructive sleep apnea: Continue CPAP.  Morbid obesity: Counseling.   Discharge Instructions  Discharge Instructions     [facility] pharmacist to adjust dose for Vancomycin, Aminoglycosides and other anti-infective therapies as requested by physician.   Complete by: As directed    [facility] to provide Cath Flo 2mg    Complete by: As directed    Administer for PICC line occlusion and as ordered by physician for other access device issues.   Anaphylaxis Kit: Provided to treat any anaphylactic reaction to the medication being provided to the patient if First Dose or when requested by physician   Complete by: As directed    Epinephrine 1mg /ml vial / amp: Administer 0.3mg  (0.57ml) subcutaneously once for moderate to severe anaphylaxis, nurse to call physician and pharmacy when reaction occurs and call 911 if needed for immediate care   Diphenhydramine 50mg /ml IV vial: Administer 25-50mg  IV/IM PRN for first dose reaction, rash, itching, mild reaction, nurse to call physician and pharmacy when reaction occurs   Sodium Chloride 0.9% NS 566ml IV: Administer if needed for hypovolemic blood pressure drop or as ordered by physician after call to physician with anaphylactic reaction   Change dressing on IV access line weekly and PRN   Complete by: As directed    Diet - low sodium heart healthy   Complete by: As directed    Flush IV access with Sodium Chloride 0.9% and Heparin 10 units/ml or 100 units/ml   Complete by: As directed    Home infusion instructions - [facility]  Complete by: As directed    Instructions: Flush IV access with Sodium Chloride 0.9% and Heparin 10units/ml or 100units/ml   Change dressing on IV  access line: Weekly and PRN   Instructions Cath Flo $Remove'2mg'[id number]$ : Administer for PICC Line occlusion and as ordered by physician for other access device   [facility] pharmacist to adjust dose for: Vancomycin, Aminoglycosides and other anti-infective therapies as requested by physician   Increase activity slowly   Complete by: As directed    Method of administration may be changed at the discretion of home infusion pharmacist based upon assessment of the patient and/or caregiver's ability to self-administer the medication ordered   Complete by: As directed    No wound care   Complete by: As directed    Outpatient Parenteral Antibiotic Therapy Information Antibiotic: Piperacillin-Tazobactam (Zosyn) IVPB; Indications for use: Left foot ulcer; End Date: 01/22/2022   Complete by: As directed    Antibiotic: Piperacillin-Tazobactam (Zosyn) IVPB   Indications for use: Left foot ulcer   End Date: 01/22/2022      Allergies as of 12/18/2021       Reactions   Sulfamethoxazole-trimethoprim Hives, Rash        Medication List     STOP taking these medications    doxycycline 100 MG tablet Commonly known as: VIBRA-TABS       TAKE these medications    acetaminophen 325 MG tablet Commonly known as: TYLENOL Take 2 tablets (650 mg total) by mouth every 6 (six) hours as needed for mild pain (or Fever >/= 101).   amLODipine 10 MG tablet Commonly known as: NORVASC Take by mouth.   aspirin EC 81 MG tablet Take 81 mg by mouth daily.   chlorthalidone 25 MG tablet Commonly known as: HYGROTON Take 25 mg by mouth daily.   Jardiance 10 MG Tabs tablet Generic drug: empagliflozin Take 10 mg by mouth daily.   Lantus SoloStar 100 UNIT/ML Solostar Pen Generic drug: insulin glargine Inject 45 Units into the skin at bedtime.   losartan 100 MG tablet Commonly known as: COZAAR Take 100 mg by mouth daily.   metFORMIN 1000 MG tablet Commonly known as: GLUCOPHAGE TAKE 1 TABLET TWICE DAILY FOR  DIABETES   multivitamin capsule Take 1 capsule by mouth daily.   piperacillin-tazobactam 3.375 GM/50ML IVPB Commonly known as: ZOSYN Inject 50 mLs (3.375 g total) into the vein every 8 (eight) hours.   simvastatin 20 MG tablet Commonly known as: ZOCOR Take by mouth.   traMADol 50 MG tablet Commonly known as: ULTRAM Take 50-100 mg by mouth every 4 (four) hours as needed.   TRUEplus Lancets 33G Misc               Discharge Care Instructions  (From admission, onward)           Start     Ordered   12/18/21 0000  Change dressing on IV access line weekly and PRN  (Home infusion instructions - [facility] )        12/18/21 0920            Allergies  Allergen Reactions   Sulfamethoxazole-Trimethoprim Hives and Rash    Consultations: ID Podiatry Vascular surgery    Procedures/Studies: [place] EKG SITE RITE  Result Date: 12/18/2021 If Site Rite image not attached, placement could not be confirmed due to current cardiac rhythm.  PERIPHERAL VASCULAR CATHETERIZATION  Result Date: 12/17/2021 See surgical note for result.  US ARTERIAL ABI (SCREENING LOWER EXTREMITY)  Result Date: 12/13/2021 CLINICAL DATA:  Bilateral lower extremity rest pain and claudication. Diabetes, hypertension, hyperlipidemia. EXAM: NONINVASIVE PHYSIOLOGIC VASCULAR STUDY OF BILATERAL LOWER EXTREMITIES TECHNIQUE: Evaluation of both lower extremities were performed at rest, including calculation of ankle-brachial indices with single level Doppler, pressure and pulse volume recording. COMPARISON:  01/08/2018 FINDINGS: Right ABI:  1.20 (previously 1.44) Left ABI:  0.83 (previously 1.35) Right Lower Extremity:  Normal arterial waveforms at the ankle. Left Lower Extremity:  Normal arterial waveforms at the ankle. IMPRESSION: 1. Mild left lower extremity peripheral arterial disease at rest. 2. Normal right ABI. Electronically Signed   By: Lucrezia Europe M.D.   On: 12/13/2021 15:25   MR FOOT LEFT  WO CONTRAST  Result Date: 12/12/2021 CLINICAL DATA:  Foot swelling. History of poorly controlled diabetes and history of osteomyelitis. Recurrent diabetic foot wounds. Osteomyelitis suspected. Based on area of interest hindfoot covered with extended field of view. EXAM: MRI OF THE LEFT FOOT WITHOUT CONTRAST TECHNIQUE: Multiplanar, multisequence MR imaging of the left hindfoot and midfoot was performed. No intravenous contrast was administered. COMPARISON:  left foot radiographs 11/28/2017; MRI left foot without and with contrast 11/29/2017 FINDINGS: TENDONS Peroneal: The peroneus brevis tendon extends towards the resected base of the fifth metatarsal. The peroneus longus tendon appears intact. Posteromedial: Mild posterior tibial tenosynovitis. The flexor digitorum longus and flexor hallucis longus tendons are intact. Anterior: The tibialis anterior, extensor hallucis longus, and extensor digitorum longus tendons are intact. Achilles: Intact. Plantar Fascia: Intact. Edema within the proximal abductor hallucis, flexor digitorum brevis and abductor digiti minimi muscles. LIGAMENTS Lateral: Attenuation of the calcaneofibular ligament. Mild thickening of the anterior talofibular ligament, possibly the sequela of remote trauma. No fluid bright tear. The anterior and posterior tibiofibular and posterior talofibular ligaments are intact. Medial: The tibiotalar deep deltoid and tibial spring ligaments are intact. CARTILAGE Ankle Joint: Intact cartilage. Subtalar Joints/Sinus Tarsi: Fat is preserved within sinus tarsi. Bones: Interval postsurgical changes of resection of the proximal third of fifth metatarsal where previously there was concern for osteomyelitis on 11/29/2017 MRI. There is ulceration again seen at the plantar lateral aspect of the surgical site. There is decreased T1 and increased T2 linear signal within the overlying skin (axial series 6 images 33-35, coronal series 8, images 14 through 16) that appears to  represent an ulcer and sinus tract contacting the plantar lateral aspect of the cuboid. There is moderate marrow edema within the plantar lateral aspect of the cuboid (coronal series 8, image 18, axial series 6, image 30) with cortical erosion of the plantar lateral cuboid indicating osteomyelitis. There is mild marrow edema within the adjacent posterolateral aspect of the postsurgical remaining portion of fifth metatarsal and more moderate marrow edema within the proximal lateral aspect of the base of fourth metatarsal (axial series 6, image 31 and sagittal series 10, image 15) also concerning for osteomyelitis. There is metallic susceptibility artifact around the region of marrow fat that may represent a prior screw tract within the calcaneus (sagittal series 9, images 15-18, axial series 6, images 24 and 25). Other: Nonspecific plantar and dorsal midfoot edema/myositis. Moderate ankle hindfoot, and midfoot subcutaneous fat edema and swelling. IMPRESSION: Compared to 11/29/2017: Interval resection of the proximal third of fifth metatarsal, an area of osteomyelitis on the prior MRI. There is a persistent ulceration of the plantar lateral aspect of the resection site, with sinus tract extending towards the adjacent cuboid bone. Findings suspicious for osteomyelitis of the plantar lateral aspect of the cuboid and adjacent base  of fourth metatarsal and possibly the proximal most aspect of the remaining fifth metatarsal shaft. Electronically Signed   By: Yvonne Kendall M.D.   On: 12/12/2021 17:30   DG Chest 2 View  Result Date: 12/12/2021 CLINICAL DATA:  Suspected sepsis. EXAM: CHEST - 2 VIEW COMPARISON:  Chest radiograph 06/25/2019. FINDINGS: Similar mildly elevated left hemidiaphragm. Similar low lung volumes. No consolidation. No visible pleural effusions or pneumothorax. Cardiomediastinal silhouette is within normal limits and similar to prior. No evidence of acute osseous abnormality. Degenerative changes of  the thoracic spine with bridging osteophytes. IMPRESSION: Similar low lung volumes without evidence of acute cardiopulmonary disease. Electronically Signed   By: Margaretha Sheffield M.D.   On: 12/12/2021 11:10   (Echo, Carotid, EGD, Colonoscopy, ERCP)    Subjective: No complaints wants to go home.  Discharge Exam: Vitals:   12/17/21 1947 12/18/21 0834  BP: (!) 152/83 (!) 143/76  Pulse: (!) 101 87  Resp: 18   Temp: 98.3 F (36.8 C)   SpO2: 99% 98%   Vitals:   12/17/21 1830 12/17/21 1908 12/17/21 1947 12/18/21 0834  BP: 136/76 (!) 159/83 (!) 152/83 (!) 143/76  Pulse: 83 91 (!) 101 87  Resp: 16  18   Temp:  98.8 F (37.1 C) 98.3 F (36.8 C)   TempSrc:  Oral Oral   SpO2: 96% 97% 99% 98%  Weight:      Height:        General: Pt is alert, awake, not in acute distress Cardiovascular: RRR, S1/S2 +, no rubs, no gallops Respiratory: CTA bilaterally, no wheezing, no rhonchi Abdominal: Soft, NT, ND, bowel sounds + Extremities: no edema, no cyanosis    The results of significant diagnostics from this hospitalization (including imaging, microbiology, ancillary and laboratory) are listed below for reference.     Microbiology: Recent Results (from the past 240 hour(s))  Culture, blood (Routine x 2)     Status: None (Preliminary result)   Collection Time: 12/12/21 10:41 AM   Specimen: BLOOD  Result Value Ref Range Status   Specimen Description BLOOD RIGHT [facility]  Final   Special Requests   Final    BOTTLES DRAWN AEROBIC AND ANAEROBIC Blood Culture adequate volume   Culture   Final    NO GROWTH 4 DAYS Performed at [facility], [address], [place], [place] [address]    Report Status PENDING  Incomplete  Culture, blood (Routine x 2)     Status: None (Preliminary result)   Collection Time: 12/12/21  1:18 PM   Specimen: BLOOD  Result Value Ref Range Status   Specimen Description BLOOD BLOOD RIGHT FOREARM  Final   Special Requests   Final    BOTTLES DRAWN AEROBIC AND  ANAEROBIC Blood Culture adequate volume   Culture   Final    NO GROWTH 4 DAYS Performed at [facility], [address] [place], [place] [address]    Report Status PENDING  Incomplete  Aerobic/Anaerobic Culture w Gram Stain (surgical/deep wound)     Status: None (Preliminary result)   Collection Time: 12/14/21  1:20 PM   Specimen: PATH Bone resection; Tissue  Result Value Ref Range Status   Specimen Description   Final    ULCER Performed at [facility], [address] [place], [place] [address]    Special Requests   Final    ULCER LEFT FOOT BONE ULCER Performed at [facility], [address] [place], [place] [address]    Gram Stain  Final    NO SQUAMOUS EPITHELIAL CELLS SEEN RARE WBC SEEN NO ORGANISMS SEEN    Culture   Final    NO GROWTH 3 DAYS NO ANAEROBES ISOLATED; CULTURE IN PROGRESS FOR 5 DAYS Performed at [facility], [place] [address], [place], [place] [address]    Report Status PENDING  Incomplete     Labs: BNP (last 3 results) No results for input(s): "BNP" in the last 8760 hours. Basic Metabolic Panel: Recent Labs  Lab 12/12/21 1041 12/13/21 0526 12/14/21 0602 12/15/21 0419  NA 134*  --   --   --   K 3.7  --   --   --   CL 100  --   --   --   CO2 26  --   --   --   GLUCOSE 193*  --   --   --   BUN 22*  --   --   --   CREATININE 0.92 1.03 1.09 1.03  CALCIUM 9.1  --   --   --    Liver Function Tests: Recent Labs  Lab 12/12/21 1041  AST 20  ALT 20  ALKPHOS 88  BILITOT 0.6  PROT 8.0  ALBUMIN 3.9   No results for input(s): "LIPASE", "AMYLASE" in the last 168 hours. No results for input(s): "AMMONIA" in the last 168 hours. CBC: Recent Labs  Lab 12/12/21 1041 12/13/21 0526  WBC 7.5 7.4  NEUTROABS 5.1  --   HGB 14.2 13.9  HCT 43.3 41.9  MCV 91.7 91.9  PLT 279 266   Cardiac Enzymes: No results for input(s): "CKTOTAL", "CKMB", "CKMBINDEX", "TROPONINI" in the last 168 hours. BNP: Invalid input(s):  "POCBNP" CBG: Recent Labs  Lab 12/17/21 0806 12/17/21 1138 12/17/21 1513 12/17/21 2139 12/18/21 0834  GLUCAP 143* 139* 110* 219* 190*   D-Dimer No results for input(s): "DDIMER" in the last 72 hours. Hgb A1c No results for input(s): "HGBA1C" in the last 72 hours. Lipid Profile No results for input(s): "CHOL", "HDL", "LDLCALC", "TRIG", "CHOLHDL", "LDLDIRECT" in the last 72 hours. Thyroid function studies No results for input(s): "TSH", "T4TOTAL", "T3FREE", "THYROIDAB" in the last 72 hours.  Invalid input(s): "FREET3" Anemia work up No results for input(s): "VITAMINB12", "FOLATE", "FERRITIN", "TIBC", "IRON", "RETICCTPCT" in the last 72 hours. Urinalysis    Component Value Date/Time   COLORURINE STRAW (A) 12/12/2021 1041   APPEARANCEUR CLEAR (A) 12/12/2021 1041   LABSPEC 1.015 12/12/2021 1041   PHURINE 6.0 12/12/2021 1041   GLUCOSEU >=500 (A) 12/12/2021 1041   HGBUR SMALL (A) 12/12/2021 1041   BILIRUBINUR NEGATIVE 12/12/2021 1041   KETONESUR NEGATIVE 12/12/2021 1041   PROTEINUR NEGATIVE 12/12/2021 1041   NITRITE NEGATIVE 12/12/2021 1041   LEUKOCYTESUR NEGATIVE 12/12/2021 1041   Sepsis Labs Recent Labs  Lab 12/12/21 1041 12/13/21 0526  WBC 7.5 7.4   Microbiology Recent Results (from the past 240 hour(s))  Culture, blood (Routine x 2)     Status: None (Preliminary result)   Collection Time: 12/12/21 10:41 AM   Specimen: BLOOD  Result Value Ref Range Status   Specimen Description BLOOD RIGHT [facility]  Final   Special Requests   Final    BOTTLES DRAWN AEROBIC AND ANAEROBIC Blood Culture adequate volume   Culture   Final    NO GROWTH 4 DAYS Performed at [facility], [address] [place], [place] [address]    Report Status PENDING  Incomplete  Culture, blood (Routine x 2)     Status:  None (Preliminary result)   Collection Time: 12/12/21  1:18 PM   Specimen: BLOOD  Result Value Ref Range Status   Specimen Description BLOOD BLOOD RIGHT FOREARM  Final    Special Requests   Final    BOTTLES DRAWN AEROBIC AND ANAEROBIC Blood Culture adequate volume   Culture   Final    NO GROWTH 4 DAYS Performed at [facility], [address] [place], [place] [address]    Report Status PENDING  Incomplete  Aerobic/Anaerobic Culture w Gram Stain (surgical/deep wound)     Status: None (Preliminary result)   Collection Time: 12/14/21  1:20 PM   Specimen: PATH Bone resection; Tissue  Result Value Ref Range Status   Specimen Description   Final    ULCER Performed at [facility], [address] [place], [place] [address]    Special Requests   Final    ULCER LEFT FOOT BONE ULCER Performed at [facility], [place], [place] [address]    Gram Stain   Final    NO SQUAMOUS EPITHELIAL CELLS SEEN RARE WBC SEEN NO ORGANISMS SEEN    Culture   Final    NO GROWTH 3 DAYS NO ANAEROBES ISOLATED; CULTURE IN PROGRESS FOR 5 DAYS Performed at [facility] [address] [place], [place] [address]    Report Status PENDING  Incomplete     SIGNED:   Charlynne Cousins, MD  [facility] 12/18/2021, 9:21 AM Pager   If 7PM-7AM, please contact night-coverage [email or web address] Password [email or web address]

## 2021-12-18 NOTE — Progress Notes (Signed)
Peripherally Inserted Central Catheter Placement  The IV Nurse has discussed with the patient and/or persons authorized to consent for the patient, the purpose of this procedure and the potential benefits and risks involved with this procedure.  The benefits include less needle sticks, lab draws from the catheter, and the patient may be discharged home with the catheter. Risks include, but not limited to, infection, bleeding, blood clot (thrombus formation), and puncture of an artery; nerve damage and irregular heartbeat and possibility to perform a PICC exchange if needed/ordered by physician.  Alternatives to this procedure were also discussed.  Bard Power PICC patient education guide, fact sheet on infection prevention and patient information card has been provided to patient /or left at bedside.    PICC Placement Documentation  PICC Single Lumen 63/84/53 Right Basilic 43 cm 0 cm (Active)  Indication for Insertion or Continuance of Line Home intravenous therapies (PICC only) 12/18/21 1500  Exposed Catheter (cm) 0 cm 12/18/21 1500  Site Assessment Clean, Dry, Intact 12/18/21 1500  Line Status Flushed;Blood return noted;Saline locked 12/18/21 1500  Dressing Type Transparent;Securing device 12/18/21 1500  Dressing Status Clean, Dry, Intact;Antimicrobial disc in place 12/18/21 1500  Dressing Change Due 12/25/21 12/18/21 1500       Jule Economy Horton 12/18/2021, 3:56 PM

## 2021-12-18 NOTE — Care Management (Signed)
Patient was discharged prior to case management seeing patient.    Called patient after discharge, spoke with wife.  Wife states that there are no transportation or medication issues at this time.    Patient's spouse states she is at home with him now, and asked about pain medications.  RNCM reached out to hospitalist, vascular and podiatry.  All stated patient should use Tylenol as explained on discharge.  RNCM gave patient message from physicians and recommended that spouse call physician office to clarify.  [facility] was requested by Dollene Primrose and set up to see patient in the AM, information relayed to patient's spouse.  [facility] aware of patient discharge with IV antibiotics and this was set up by Ms. Chandler and hospital pharmacist.  Patient's spouse indicated no further needs at this time.

## 2021-12-18 NOTE — Progress Notes (Signed)
Daily Progress Note   Subjective  - 1 Day Post-Op  Follow-up debridement I&D left foot.  Stable at this time.  Underwent vascular evaluation.  They are planning to perform repeat procedure outpatient.  Infectious disease has seen.  Patient has prescription for Zosyn.  Objective Vitals:   12/17/21 1830 12/17/21 1908 12/17/21 1947 12/18/21 0834  BP: 136/76 (!) 159/83 (!) 152/83 (!) 143/76  Pulse: 83 91 (!) 101 87  Resp: 16  18   Temp:  98.8 F (37.1 C) 98.3 F (36.8 C)   TempSrc:  Oral Oral   SpO2: 96% 97% 99% 98%  Weight:      Height:        Physical Exam: Dressing changed today.  No purulent drainage.  The most proximal lateral incision site had a small area of edema.  I removed one of the sutures.  A few antibiotic impregnated beads were expressed that decompressed the area.  No purulence noted.  Laboratory CBC    Component Value Date/Time   WBC 7.4 12/13/2021 0526   HGB 13.9 12/13/2021 0526   HGB 13.0 03/26/2014 2050   HCT 41.9 12/13/2021 0526   HCT 39.7 (L) 03/26/2014 2050   PLT 266 12/13/2021 0526   PLT 202 03/26/2014 2050    BMET    Component Value Date/Time   NA 134 (L) 12/12/2021 1041   NA 139 02/10/2018 1154   NA 134 (L) 03/26/2014 2050   K 3.7 12/12/2021 1041   K 4.3 03/26/2014 2050   CL 100 12/12/2021 1041   CL 103 03/26/2014 2050   CO2 26 12/12/2021 1041   CO2 29 03/26/2014 2050   GLUCOSE 193 (H) 12/12/2021 1041   GLUCOSE 150 (H) 03/26/2014 2050   BUN 22 (H) 12/12/2021 1041   BUN 13 02/10/2018 1154   BUN 18 03/26/2014 2050   CREATININE 1.03 12/15/2021 0419   CREATININE 0.86 03/26/2014 2050   CALCIUM 9.1 12/12/2021 1041   CALCIUM 8.7 03/26/2014 2050   GFRNONAA >60 12/15/2021 0419   GFRNONAA >60 03/26/2014 2050   GFRNONAA >60 10/29/2012 1757   GFRAA >60 06/25/2019 0453   GFRAA >60 03/26/2014 2050   GFRAA >60 10/29/2012 1757    Assessment/Planning: Status post I&D for osteomyelitis left foot with debridement  I recommended Betadine  dressing changes.  This can be performed by family.  Have home health nursing teach family to paint wound with Betadine and apply a dry gauze.  This can be performed every other day. Remain nonweightbearing to his left foot. Antibiotics per infectious disease. Follow-up in my outpatient clinic in 2 weeks. From podiatry standpoint okay to discharge.  Samara Deist A  12/18/2021, 12:39 PM

## 2021-12-18 NOTE — Progress Notes (Addendum)
PHARMACY CONSULT NOTE FOR:  OUTPATIENT  PARENTERAL ANTIBIOTIC THERAPY (OPAT)  Indication: diabetic foot infection/osteomyelitis Regimen: piperacillin/tazobactam 13.5gm infused daily over 24h as continuous infusion  End date: 01/22/2022  Please pull PIC at completion of IV antibiotics Fax weekly labs to [facility] [phone]  IV antibiotic discharge orders are pended. To discharging provider:  please sign these orders via discharge navigator,  Select New Orders & click on the button choice - Manage This Unsigned Work.     Thank you for allowing pharmacy to be a part of this patient's care.  Doreene Eland, PharmD, BCPS, BCIDP Work Cell: [phone] 12/18/2021 9:49 AM

## 2021-12-18 NOTE — Plan of Care (Signed)

## 2021-12-18 NOTE — Progress Notes (Signed)
Discussed with patient after angiogram yesterday, will need to come from a pedal approach to treat tibial disease.  Can do this as an outpatient and does not need to stay in the hospital for that.  My office will arrange this.

## 2021-12-19 LAB — AEROBIC/ANAEROBIC CULTURE W GRAM STAIN (SURGICAL/DEEP WOUND)
Culture: NO GROWTH
Gram Stain: NONE SEEN

## 2022-01-02 ENCOUNTER — Other Ambulatory Visit: Payer: Self-pay

## 2022-01-02 DIAGNOSIS — Z86010 Personal history of colonic polyps: Secondary | ICD-10-CM

## 2022-01-02 MED ORDER — NA SULFATE-K SULFATE-MG SULF 17.5-3.13-1.6 GM/177ML PO SOLN: 1.0000 | Freq: Once | ORAL | 0 refills | Status: AC

## 2022-01-02 NOTE — Progress Notes (Signed)
Gastroenterology Pre-Procedure Review  Request Date: 02/25/2022 Requesting Physician: Dr. Marius Ditch  PATIENT REVIEW QUESTIONS: The patient responded to the following health history questions as indicated:    1. Are you having any GI issues? no 2. Do you have a personal history of Polyps? yes (last colonoscopy 6 years ago) 3. Do you have a family history of Colon Cancer or Polyps? no 4. Diabetes Mellitus? yes (type 2) 5. Joint replacements in the past 12 months?yes (had debrevment on foot ) 6. Major health problems in the past 3 months?no 7. Any artificial heart valves, MVP, or defibrillator?no    MEDICATIONS & ALLERGIES:    Patient reports the following regarding taking any anticoagulation/antiplatelet therapy:   Plavix, Coumadin, Eliquis, Xarelto, Lovenox, Pradaxa, Brilinta, or Effient? no Aspirin? yes (81 mg)  Patient confirms/reports the following medications:  Current Outpatient Medications  Medication Sig Dispense Refill   acetaminophen (TYLENOL) 325 MG tablet Take 2 tablets (650 mg total) by mouth every 6 (six) hours as needed for mild pain (or Fever >/= 101).     amLODipine (NORVASC) 10 MG tablet Take by mouth.     aspirin EC 81 MG tablet Take 81 mg by mouth daily.     chlorthalidone (HYGROTON) 25 MG tablet Take 25 mg by mouth daily.     JARDIANCE 10 MG TABS tablet Take 10 mg by mouth daily.     LANTUS SOLOSTAR 100 UNIT/ML Solostar Pen Inject 45 Units into the skin at bedtime.     losartan (COZAAR) 100 MG tablet Take 100 mg by mouth daily.      metFORMIN (GLUCOPHAGE) 1000 MG tablet TAKE 1 TABLET TWICE DAILY FOR DIABETES     Multiple Vitamin (MULTIVITAMIN) capsule Take 1 capsule by mouth daily.     piperacillin-tazobactam (ZOSYN) IVPB Inject 13.5 g into the vein continuous. Infuse 13.5gm daily over 24hr as continuous infusion Indication:  diabetic foot infection and osteomyelitis First Dose: Yes Last Day of Therapy: 01/22/2022 Labs - Once weekly:  CBC/D, CMP, ESR and CRP Method  of administration: Elastomeric (Continuous infusion) Method of administration may be changed at the discretion of home infusion pharmacist based upon assessment of the patient and/or caregiver's ability to self-administer the medication ordered. 36 Units 0   simvastatin (ZOCOR) 20 MG tablet Take by mouth.     traMADol (ULTRAM) 50 MG tablet Take 50-100 mg by mouth every 4 (four) hours as needed.     TRUEplus Lancets 33G MISC      No current facility-administered medications for this visit.   Facility-Administered Medications Ordered in Other Visits  Medication Dose Route Frequency Provider Last Rate Last Admin   Tdap (BOOSTRIX) injection 0.5 mL  0.5 mL Intramuscular Once Betancourt, Aura Fey, NP        Patient confirms/reports the following allergies:  Allergies  Allergen Reactions   Sulfamethoxazole-Trimethoprim Hives and Rash    No orders of the defined types were placed in this encounter.   AUTHORIZATION INFORMATION Primary Insurance: 1D#: Group #:  Secondary Insurance: 1D#: Group #:  SCHEDULE INFORMATION: Date: 02/25/2022 Time: Location:[facility]

## 2022-01-07 ENCOUNTER — Telehealth (INDEPENDENT_AMBULATORY_CARE_PROVIDER_SITE_OTHER): Payer: Self-pay | Admitting: Vascular Surgery

## 2022-01-07 NOTE — Telephone Encounter (Signed)
[name] from [facility] called to get additional appointments for pt.  Did not see any notes, and forward patient to Mickel Baas.

## 2022-01-07 NOTE — Telephone Encounter (Signed)
Colletta Maryland from [facility] called to get additional appointments for pt.  Did not see any notes, and forward patient to Mickel Baas. Colletta Maryland can be reached at [phone].

## 2022-01-08 ENCOUNTER — Ambulatory Visit: Payer: 59 | Attending: Infectious Diseases | Admitting: Infectious Diseases

## 2022-01-08 ENCOUNTER — Encounter: Payer: Self-pay | Admitting: Infectious Diseases

## 2022-01-08 ENCOUNTER — Other Ambulatory Visit
Admission: RE | Admit: 2022-01-08 | Discharge: 2022-01-08 | Disposition: A | Discharge status: Home / Self Care | Payer: 59 | Source: Ambulatory Visit | Attending: Infectious Diseases | Admitting: Infectious Diseases

## 2022-01-08 VITALS — BP 151/82 | HR 85 | Temp 98.2°F | Wt 250.0 lb

## 2022-01-08 DIAGNOSIS — L089 Local infection of the skin and subcutaneous tissue, unspecified: Secondary | ICD-10-CM

## 2022-01-08 DIAGNOSIS — N400 Enlarged prostate without lower urinary tract symptoms: Secondary | ICD-10-CM | POA: Diagnosis not present

## 2022-01-08 DIAGNOSIS — Z89421 Acquired absence of other right toe(s): Secondary | ICD-10-CM | POA: Diagnosis not present

## 2022-01-08 DIAGNOSIS — M86672 Other chronic osteomyelitis, left ankle and foot: Secondary | ICD-10-CM

## 2022-01-08 DIAGNOSIS — Z4889 Encounter for other specified surgical aftercare: Secondary | ICD-10-CM | POA: Insufficient documentation

## 2022-01-08 DIAGNOSIS — I10 Essential (primary) hypertension: Secondary | ICD-10-CM | POA: Diagnosis not present

## 2022-01-08 DIAGNOSIS — E785 Hyperlipidemia, unspecified: Secondary | ICD-10-CM | POA: Diagnosis not present

## 2022-01-08 DIAGNOSIS — E1142 Type 2 diabetes mellitus with diabetic polyneuropathy: Secondary | ICD-10-CM | POA: Diagnosis not present

## 2022-01-08 DIAGNOSIS — G4733 Obstructive sleep apnea (adult) (pediatric): Secondary | ICD-10-CM | POA: Diagnosis not present

## 2022-01-08 DIAGNOSIS — Z7984 Long term (current) use of oral hypoglycemic drugs: Secondary | ICD-10-CM | POA: Diagnosis not present

## 2022-01-08 DIAGNOSIS — Z794 Long term (current) use of insulin: Secondary | ICD-10-CM

## 2022-01-08 DIAGNOSIS — E1169 Type 2 diabetes mellitus with other specified complication: Secondary | ICD-10-CM | POA: Diagnosis not present

## 2022-01-08 DIAGNOSIS — Z9889 Other specified postprocedural states: Secondary | ICD-10-CM | POA: Diagnosis not present

## 2022-01-08 DIAGNOSIS — E11628 Type 2 diabetes mellitus with other skin complications: Secondary | ICD-10-CM | POA: Diagnosis not present

## 2022-01-08 NOTE — Patient Instructions (Addendum)
  Today you are here to follow up with left foot infeciton- you are on zosyn- end date 01/22/22 - did another culture today Would recommend reading this book by Hassell Done -Mayor of [facility] on his journey to reverse Diabetes by healthy eating -plant based diet

## 2022-01-08 NOTE — Telephone Encounter (Signed)
I did ask Anderson Malta about this and after explaining that they are asking about appts and notes and I did not have the patient scheduled or need to, I was asked to send to [facility].

## 2022-01-08 NOTE — Progress Notes (Signed)
NAME: [name]  DOB: 11/06/1960  MRN: [id number]  Date/Time: 01/08/2022 10:54 AM   Subjective:   ?follow up after recent hospitalization [name] is a 61 y.o. with a history of DM, peripheral neuropathy DFI, HLD, HTN, OSA, BPH, rt 5th toe partial ray amputation , left 5th metatarsal debirdement in June 2019 with IV antibiotics for 4 weeks  6/14-6/23/23 in the hospital doe left foot infection due to diabetes mellitus, underwent debridement down to bone and bone biopsy Pathology was chronic osteo, bone culture no growth- previously culture as OP was morganella Pt was sent home on IV zosyn He says he had more pus and the sutures were removed and the wound was cleaned by Dr.Fowler No fever or chills Concerned that the wound is not healing well   Past Medical History:  Diagnosis Date   Arthritis    Bacterial infection due to Morganella morganii 12/26/2017   BPH (benign prostatic hypertrophy)    Diabetes mellitus    Diabetic neuropathy ([place])    Hypertension    Metatarsal bone fracture    left 5th toe   Osteomyelitis of toe of right foot ([place])    Peripheral vascular disease ([place])    Post-operative infection    and diabetic ulcer left foot   Sepsis due to Streptococcus, group B ([place]) 12/26/2017   Sleep apnea     does wear CPAP   Wears glasses     Past Surgical History:  Procedure Laterality Date   AMPUTATION TOE Left    small toe   AMPUTATION TOE Right 04/02/2019   Procedure: AMPUTATION TOE 2ND;  Surgeon: Samara Deist, DPM;  Location: [facility];  Service: Podiatry;  Laterality: Right;   COLONOSCOPY W/ BIOPSIES AND POLYPECTOMY     I & D EXTREMITY Left 01/11/2016   Procedure: LEFT FOOT IRRIGATION AND DEBRIDEMENT WOUND VAC AND REMOVAL OF HARDWARE;  Surgeon: Wylene Simmer, MD;  Location: [facility];  Service: Orthopedics;  Laterality: Left;   IRRIGATION AND DEBRIDEMENT FOOT Left 12/14/2021   Procedure: IRRIGATION AND DEBRIDEMENT FOOT;  Surgeon: Samara Deist, DPM;  Location: [facility]  [facility];  Service: Podiatry;  Laterality: Left;   LOWER EXTREMITY ANGIOGRAPHY N/A 12/01/2017   Procedure: Lower Extremity Angiography;  Surgeon: Algernon Huxley, MD;  Location: [place] [facility];  Service: Cardiovascular;  Laterality: N/A;   LOWER EXTREMITY ANGIOGRAPHY Right 12/04/2017   Procedure: Lower Extremity Angiography;  Surgeon: Algernon Huxley, MD;  Location: [place] [facility];  Service: Cardiovascular;  Laterality: Right;   LOWER EXTREMITY ANGIOGRAPHY Left 03/12/2018   Procedure: LOWER EXTREMITY ANGIOGRAPHY;  Surgeon: Algernon Huxley, MD;  Location: [place] [facility];  Service: Cardiovascular;  Laterality: Left;   LOWER EXTREMITY ANGIOGRAPHY Left 12/17/2021   Procedure: Lower Extremity Angiography;  Surgeon: Algernon Huxley, MD;  Location: [place] [facility];  Service: Cardiovascular;  Laterality: Left;   ORIF TOE FRACTURE Left 06/08/2015   Procedure: OPEN REDUCTION INTERNAL FIXATION (ORIF) LEFT FIFTH METATARSAL BASE FRACTURE NONUNION; CALCANEAL AUTOGRAFT ;  Surgeon: Wylene Simmer, MD;  Location: [place];  Service: Orthopedics;  Laterality: Left;   PATELLA RECONSTRUCTION Left 2005   PROSTATE ABLATION  2014   TOE AMPUTATION     partial amputation right great toe   WOUND DEBRIDEMENT Bilateral 11/30/2017   Procedure: DEBRIDEMENT WOUND;  Surgeon: Sharlotte Alamo, DPM;  Location: [facility];  Service: Podiatry;  Laterality: Bilateral;    Social History   Socioeconomic History   Marital status: Married  Spouse name: [name]   Number of children: 2   Years of education: Not on file   Highest education level: Not on file  Occupational History   Not on file  Tobacco Use   Smoking status: Never   Smokeless tobacco: Never  Vaping Use   Vaping Use: Never used  Substance and Sexual Activity   Alcohol use: Yes    Comment: occasional    Drug use: Yes    Types: Marijuana, Cocaine    Comment: last month   Sexual activity: Not on file  Other Topics Concern   Not on file  Social History  Narrative   Lives with wife. Children all grown up.   Social Determinants of Health   Financial Resource Strain: Low Risk  (04/01/2019)   Overall Financial Resource Strain (CARDIA)    Difficulty of Paying Living Expenses: Not very hard  Food Insecurity: No Food Insecurity (04/01/2019)   Hunger Vital Sign    Worried About Running Out of Food in the Last Year: Never true    Ran Out of Food in the Last Year: Never true  Transportation Needs: No Transportation Needs (04/01/2019)   PRAPARE - Hydrologist (Medical): No    Lack of Transportation (Non-Medical): No  Physical Activity: Insufficiently Active (04/01/2019)   Exercise Vital Sign    Days of Exercise per Week: 1 day    Minutes of Exercise per Session: 60 min  Stress: No Stress Concern Present (04/01/2019)   [place]    Feeling of Stress : Not at all  Social Connections: [place] (04/01/2019)   Social Connection and Isolation Panel [NHANES]    Frequency of Communication with Friends and Family: More than three times a week    Frequency of Social Gatherings with Friends and Family: More than three times a week    Attends Religious Services: More than 4 times per year    Active Member of [employer] or Organizations: Yes    Attends Music therapist: More than 4 times per year    Marital Status: Married  Human resources officer Violence: Not At Risk (04/01/2019)   Humiliation, Afraid, Rape, and Kick questionnaire    Fear of Current or Ex-Partner: No    Emotionally Abused: No    Physically Abused: No    Sexually Abused: No    Family History  Problem Relation Age of Onset   Diabetes Mother    Diabetes Other    Allergies  Allergen Reactions   Sulfamethoxazole-Trimethoprim Hives and Rash   I? Current Outpatient Medications  Medication Sig Dispense Refill   acetaminophen (TYLENOL) 325 MG tablet Take 2 tablets (650 mg total) by  mouth every 6 (six) hours as needed for mild pain (or Fever >/= 101).     amLODipine (NORVASC) 10 MG tablet Take by mouth.     aspirin EC 81 MG tablet Take 81 mg by mouth daily.     chlorthalidone (HYGROTON) 25 MG tablet Take 25 mg by mouth daily.     JARDIANCE 10 MG TABS tablet Take 10 mg by mouth daily.     LANTUS SOLOSTAR 100 UNIT/ML Solostar Pen Inject 45 Units into the skin at bedtime.     losartan (COZAAR) 100 MG tablet Take 100 mg by mouth daily.      metFORMIN (GLUCOPHAGE) 1000 MG tablet TAKE 1 TABLET TWICE DAILY FOR DIABETES     Multiple Vitamin (MULTIVITAMIN) capsule Take 1  capsule by mouth daily.     piperacillin-tazobactam (ZOSYN) IVPB Inject 13.5 g into the vein continuous. Infuse 13.5gm daily over 24hr as continuous infusion Indication:  diabetic foot infection and osteomyelitis First Dose: Yes Last Day of Therapy: 01/22/2022 Labs - Once weekly:  CBC/D, CMP, ESR and CRP Method of administration: Elastomeric (Continuous infusion) Method of administration may be changed at the discretion of home infusion pharmacist based upon assessment of the patient and/or caregiver's ability to self-administer the medication ordered. 36 Units 0   simvastatin (ZOCOR) 20 MG tablet Take by mouth.     traMADol (ULTRAM) 50 MG tablet Take 50-100 mg by mouth every 4 (four) hours as needed.     TRUEplus Lancets 33G MISC      No current facility-administered medications for this visit.   Facility-Administered Medications Ordered in Other Visits  Medication Dose Route Frequency Provider Last Rate Last Admin   Tdap (BOOSTRIX) injection 0.5 mL  0.5 mL Intramuscular Once Betancourt, Tina A, NP         Abtx:  Anti-infectives (From admission, onward)    None       REVIEW OF SYSTEMS:  Const: negative fever, negative chills, negative weight loss Eyes: negative diplopia or visual changes, negative eye pain ENT: negative coryza, negative sore throat Resp: negative cough, hemoptysis, dyspnea Cards:  negative for chest pain, palpitations, lower extremity edema GU: negative for frequency, dysuria and hematuria GI: Negative for abdominal pain, diarrhea, bleeding, constipation Skin: negative for rash and pruritus Heme: negative for easy bruising and gum/nose bleeding MS: uses knee scooter to move around Neurolo:negative for headaches, dizziness, vertigo, memory problems  Psych: negative for feelings of anxiety, depression  Endocrine: , diabetes Allergy/Immunology- sulfa Objective:  VITALS:  BP (!) 151/82   Pulse 85   Temp 98.2 F (36.8 C) (Temporal)   Wt 250 lb (113.4 kg)   BMI 35.87 kg/m  LDA PICC rt arm PHYSICAL EXAM:  General: Alert, cooperative, no distress, appears stated age.  Head: Normocephalic, without obvious abnormality, atraumatic. Eyes: Conjunctivae clear, anicteric sclerae. Pupils are equal ENT Nares normal. No drainage or sinus tenderness. Lips, mucosa, and tongue normal. No Thrush Neck: Supple, symmetrical, no adenopathy, thyroid: non tender no carotid bruit and no JVD. Back: No CVA tenderness. Lungs: Clear to auscultation bilaterally. No Wheezing or Rhonchi. No rales. Heart: Regular rate and rhythm, no murmur, rub or gallop. Abdomen: Soft, non-tender,not distended. Bowel sounds normal. No masses Extremities: left foot  Wound on the lateral aspect of the foot Lymph: Cervical, supraclavicular normal. Neurologic: Grossly non-focal Pertinent Labs Lab Results CBC    Component Value Date/Time   WBC 7.4 12/13/2021 0526   RBC 4.56 12/13/2021 0526   HGB 13.9 12/13/2021 0526   HGB 13.0 03/26/2014 2050   HCT 41.9 12/13/2021 0526   HCT 39.7 (L) 03/26/2014 2050   PLT 266 12/13/2021 0526   PLT 202 03/26/2014 2050   MCV 91.9 12/13/2021 0526   MCV 94 03/26/2014 2050   MCH 30.5 12/13/2021 0526   MCHC 33.2 12/13/2021 0526   RDW 12.8 12/13/2021 0526   RDW 12.9 03/26/2014 2050   LYMPHSABS 1.6 12/12/2021 1041   LYMPHSABS 2.1 11/01/2012 0500   MONOABS 0.7  12/12/2021 1041   MONOABS 0.9 11/01/2012 0500   EOSABS 0.1 12/12/2021 1041   EOSABS 0.2 11/01/2012 0500   BASOSABS 0.0 12/12/2021 1041   BASOSABS 0.0 11/01/2012 0500       Latest Ref Rng & Units 12/15/2021    4:19 AM  12/14/2021    6:02 AM 12/13/2021    5:26 AM  CMP  Creatinine 0.61 - 1.24 mg/dL 1.03  1.09  1.03     ? Impression/Recommendation ? ?DM with peripheral neuropathy Diabetic foot infection left with ulceration and involvement of bone ( cuboid, 4th met)  Underwent debridement on 12/14/21 Chronic osteo Culture Neg On zosyn until 01/22/22 -6 weeks Today will repeat culture  Will inform him about culture  DM- on metformin, insulin Discussed plant based diet  Follow up with Dr.Fowler- ? ___________________________________________________ Discussed with patient in detail Note:  This document was prepared using Dragon voice recognition software and may include unintentional dictation errors.

## 2022-01-09 NOTE — Telephone Encounter (Signed)
Spoke with Colletta Maryland and then Eulogio Ditch, NP. We will need to get prior auth for another leg angio for pt. I have given information to Elta [place] for processing.

## 2022-01-09 NOTE — Telephone Encounter (Signed)
Called Stephanie at listed number - she was not available - left a message with receptionist to have [place] call me back.

## 2022-01-10 ENCOUNTER — Telehealth (INDEPENDENT_AMBULATORY_CARE_PROVIDER_SITE_OTHER): Payer: Self-pay

## 2022-01-10 NOTE — Telephone Encounter (Signed)
Spoke with the patient to schedule him for a left leg angio w/ pedal approach with Dr. Lucky Cowboy. Patient was offered 01/14/22 and declined stating he could not get anyone to drive him at that time and would call back to schedule his angio.

## 2022-01-11 LAB — AEROBIC CULTURE W GRAM STAIN (SUPERFICIAL SPECIMEN)

## 2022-01-29 ENCOUNTER — Encounter: Payer: 59 | Attending: Physician Assistant | Admitting: Physician Assistant

## 2022-01-29 ENCOUNTER — Other Ambulatory Visit
Admission: RE | Admit: 2022-01-29 | Discharge: 2022-01-29 | Disposition: A | Discharge status: Home / Self Care | Payer: 59 | Source: Ambulatory Visit | Attending: Physician Assistant | Admitting: Physician Assistant

## 2022-01-29 DIAGNOSIS — T700XXA Otitic barotrauma, initial encounter: Secondary | ICD-10-CM | POA: Diagnosis not present

## 2022-01-29 DIAGNOSIS — L97524 Non-pressure chronic ulcer of other part of left foot with necrosis of bone: Secondary | ICD-10-CM | POA: Insufficient documentation

## 2022-01-29 DIAGNOSIS — E11621 Type 2 diabetes mellitus with foot ulcer: Secondary | ICD-10-CM | POA: Insufficient documentation

## 2022-01-29 DIAGNOSIS — T8131XA Disruption of external operation (surgical) wound, not elsewhere classified, initial encounter: Secondary | ICD-10-CM | POA: Insufficient documentation

## 2022-01-29 DIAGNOSIS — M86372 Chronic multifocal osteomyelitis, left ankle and foot: Secondary | ICD-10-CM | POA: Diagnosis not present

## 2022-01-29 DIAGNOSIS — Z89411 Acquired absence of right great toe: Secondary | ICD-10-CM | POA: Diagnosis not present

## 2022-01-29 DIAGNOSIS — E1142 Type 2 diabetes mellitus with diabetic polyneuropathy: Secondary | ICD-10-CM | POA: Diagnosis not present

## 2022-01-29 DIAGNOSIS — B999 Unspecified infectious disease: Secondary | ICD-10-CM | POA: Diagnosis not present

## 2022-01-29 DIAGNOSIS — X58XXXA Exposure to other specified factors, initial encounter: Secondary | ICD-10-CM | POA: Diagnosis not present

## 2022-01-29 NOTE — Progress Notes (Signed)
HONDO, NANDA ([id number]) Visit Report for 01/29/2022 Abuse Risk Screen Details Patient Name: [name], [name]. Date of Service: 01/29/2022 8:45 AM Medical Record Number: [id number] Patient Account Number: [id number] Date of Birth/Sex: Oct 08, 1960 (61 y.o. M) Treating RN: Carlene Coria Primary Care Si Jachim: [name] Other Clinician: Referring Jovie Swanner: Referral, Self Treating Kestrel Mis/Extender: Skipper Cliche in Treatment: 0 Abuse Risk Screen Items Answer ABUSE RISK SCREEN: Has anyone close to you tried to hurt or harm you recentlyo No Do you feel uncomfortable with anyone in your familyo No Has anyone forced you do things that you didnot want to doo No Electronic Signature(s) Signed: 01/29/2022 4:03:12 PM By: Carlene Coria RN Entered By: Carlene Coria on 01/29/2022 08:50:38 [name] ([id number]) -------------------------------------------------------------------------------- Activities of Daily Living Details Patient Name: [name]. Date of Service: 01/29/2022 8:45 AM Medical Record Number: [id number] Patient Account Number: [id number] Date of Birth/Sex: Jul 06, 1960 (61 y.o. M) Treating RN: Carlene Coria Primary Care Hrithik Boschee: [name] Other Clinician: Referring Shariff Lasky: Referral, Self Treating Maclean Foister/Extender: Skipper Cliche in Treatment: 0 Activities of Daily Living Items Answer Activities of Daily Living (Please select one for each item) Drive Automobile Completely Able Take Medications Completely Able Use Telephone Completely Able Care for Appearance Completely Able Use Toilet Completely Able Bath / Shower Completely Able Dress Self Completely Able Feed Self Completely Able Walk Completely Able Get In / Out Bed Completely Able Housework Completely Able Prepare Meals Completely Able Handle Money Completely Able Shop for Self Completely Able Electronic Signature(s) Signed: 01/29/2022 4:03:12 PM By: Carlene Coria RN Entered By: Carlene Coria on  01/29/2022 08:51:12 [name] ([id number]) -------------------------------------------------------------------------------- Education Screening Details Patient Name: [name]. Date of Service: 01/29/2022 8:45 AM Medical Record Number: [id number] Patient Account Number: [id number] Date of Birth/Sex: Aug 13, 1960 (61 y.o. M) Treating RN: Carlene Coria Primary Care Nancyann Cotterman: [name] Other Clinician: Referring Atticus Lemberger: Referral, Self Treating Lorie Cleckley/Extender: Skipper Cliche in Treatment: 0 Primary Learner Assessed: Patient Learning Preferences/Education Level/Primary Language Learning Preference: Explanation Highest Education Level: High School Preferred Language: English Cognitive Barrier Language Barrier: No Translator Needed: No Memory Deficit: No Emotional Barrier: No Cultural/Religious Beliefs Affecting Medical Care: No Physical Barrier Impaired Vision: Yes Glasses Impaired Hearing: No Decreased Hand dexterity: No Knowledge/Comprehension Knowledge Level: Medium Comprehension Level: High Ability to understand written instructions: High Ability to understand verbal instructions: High Motivation Anxiety Level: Anxious Cooperation: Cooperative Education Importance: Acknowledges Need Interest in Health Problems: Asks Questions Perception: Coherent Willingness to Engage in Self-Management High Activities: Readiness to Engage in Self-Management High Activities: Electronic Signature(s) Signed: 01/29/2022 4:03:12 PM By: Carlene Coria RN Entered By: Carlene Coria on 01/29/2022 08:51:45 [name] ([id number]) -------------------------------------------------------------------------------- Fall Risk Assessment Details Patient Name: [name]. Date of Service: 01/29/2022 8:45 AM Medical Record Number: [id number] Patient Account Number: [id number] Date of Birth/Sex: [birth year] (61 y.o. M) Treating RN: Carlene Coria Primary Care Jaelyn Cloninger: [name] Other Clinician: Referring Charmagne Buhl: Referral, Self Treating Julee Stoll/Extender: Skipper Cliche in Treatment: 0 Fall Risk Assessment Items Have you had 2 or more falls in the last 12 monthso 0 No Have you had any fall that resulted in injury in the last 12 monthso 0 No FALLS RISK SCREEN History of falling - immediate or within 3 months 0 No Secondary diagnosis (Do you have 2 or more medical diagnoseso) 0 No Ambulatory aid None/bed rest/wheelchair/nurse 0 No Crutches/cane/walker 0 No Furniture 0 No Intravenous therapy Access/Saline/Heparin Lock 0 No Gait/Transferring Normal/ bed rest/ wheelchair 0 No Weak (short steps with  or without shuffle, stooped but able to lift head while walking, may 0 No seek support from furniture) Impaired (short steps with shuffle, may have difficulty arising from chair, head down, impaired 0 No balance) Mental Status Oriented to own ability 0 No Electronic Signature(s) Signed: 01/29/2022 4:03:12 PM By: Carlene Coria RN Entered By: Carlene Coria on 01/29/2022 08:51:52 [name] ([id number]) -------------------------------------------------------------------------------- Foot Assessment Details Patient Name: [name]. Date of Service: 01/29/2022 8:45 AM Medical Record Number: [id number] Patient Account Number: [id number] Date of Birth/Sex: Apr 08, 1961 (61 y.o. M) Treating RN: Carlene Coria Primary Care Minetta Krisher: [name] Other Clinician: Referring Yenty Bloch: Referral, Self Treating Ciara Kagan/Extender: Skipper Cliche in Treatment: 0 Foot Assessment Items Site Locations + = Sensation present, - = Sensation absent, C = Callus, U = Ulcer R = Redness, W = Warmth, M = Maceration, PU = Pre-ulcerative lesion F = Fissure, S = Swelling, D = Dryness Assessment Right: Left: Other Deformity: No No Prior Foot Ulcer: Yes No Prior Amputation: Yes No Charcot Joint: No Yes Ambulatory Status: Ambulatory Without Help Gait:  Steady Electronic Signature(s) Signed: 01/29/2022 4:03:12 PM By: Carlene Coria RN Entered By: Carlene Coria on 01/29/2022 09:10:00 [name] ([id number]) -------------------------------------------------------------------------------- Nutrition Risk Screening Details Patient Name: [name]. Date of Service: 01/29/2022 8:45 AM Medical Record Number: [id number] Patient Account Number: [id number] Date of Birth/Sex: 09/27/1960 (61 y.o. M) Treating RN: Carlene Coria Primary Care Husayn Reim: [name] Other Clinician: Referring Dajana Gehrig: Referral, Self Treating Makale Pindell/Extender: Skipper Cliche in Treatment: 0 Height (in): 70 Weight (lbs): 255 Body Mass Index (BMI): 36.6 Nutrition Risk Screening Items Score Screening NUTRITION RISK SCREEN: I have an illness or condition that made me change the kind and/or amount of food I eat 0 No I eat fewer than two meals per day 0 No I eat few fruits and vegetables, or milk products 0 No I have three or more drinks of beer, liquor or wine almost every day 0 No I have tooth or mouth problems that make it hard for me to eat 0 No I don't always have enough money to buy the food I need 0 No I eat alone most of the time 0 No I take three or more different prescribed or over-the-counter drugs a day 1 Yes Without wanting to, I have lost or gained 10 pounds in the last six months 0 No I am not always physically able to shop, cook and/or feed myself 0 No Nutrition Protocols Good Risk Protocol Moderate Risk Protocol High Risk Proctocol Risk Level: Good Risk Score: 1 Electronic Signature(s) Signed: 01/29/2022 4:03:12 PM By: Carlene Coria RN Entered By: Carlene Coria on 01/29/2022 08:51:58

## 2022-01-29 NOTE — Progress Notes (Signed)
AH, BOTT ([id number]) Visit Report for 01/29/2022 HBO Patient Questionnaire Details Patient Name: [name], [name]. Date of Service: 01/29/2022 8:45 AM Medical Record Number: [id number] Patient Account Number: [id number] Date of Birth/Sex: 31-Aug-1960 (61 y.o. M) Treating RN: Carlene Coria Primary Care [place] Agostinelli: [name] Other Clinician: Referring Cosette Prindle: Referral, Self Treating Michaelann Gunnoe/Extender: Skipper Cliche in Treatment: 0 HBO Patient Questionnaire Items Answer Any "Yes" answers must be brought to the hyperbaric physician's attention. Breathing or Lung problemso No Currently use tobacco productso No Used tobacco products in the pasto Yes Heart problemso No Do you take water pills (diuretic)o No Diabeteso Yes On Diabetes pillo Yes If yes, last time taken: today On Insulino Yes If yes, last time taken: 01/29/2022 Dialysiso No Eye problems like glaucomao No Ear problems or surgeryo No Sinus Problemso No Confinement Anxiety (Claustrophobia- fear of confined places)o No Any medical implants/devices that are fully or partially implanted or attached to your bodyo No Pregnanto No Seizureso No Date of last: Chest x-ray: 12/17/2021 Electronic Signature(s) Signed: 01/29/2022 4:03:12 PM By: Carlene Coria RN Entered By: Carlene Coria on 01/29/2022 10:08:12

## 2022-01-29 NOTE — Progress Notes (Addendum)
RYU, CERRETA ([id number]) Visit Report for 01/29/2022 Allergy List Details Patient Name: [name], [name]. Date of Service: 01/29/2022 8:45 AM Medical Record Number: [id number] Patient Account Number: [id number] Date of Birth/Sex: 1960/10/05 (61 y.o. M) Treating RN: Carlene Coria Primary Care Kalla Watson: [name] Other Clinician: Referring Azael Ragain: Referral, Self Treating Blease Capaldi/Extender: Skipper Cliche in Treatment: 0 Allergies Active Allergies Bactrim Allergy Notes Electronic Signature(s) Signed: 01/29/2022 4:03:12 PM By: Carlene Coria RN Entered By: Carlene Coria on 01/29/2022 08:49:40 [name] ([id number]) -------------------------------------------------------------------------------- Arrival Information Details Patient Name: [name]. Date of Service: 01/29/2022 8:45 AM Medical Record Number: [id number] Patient Account Number: [id number] Date of Birth/Sex: 08/21/1960 (61 y.o. M) Treating RN: Carlene Coria Primary Care Raif Chachere: [name] Other Clinician: Referring Krisna Omar: Referral, Self Treating Boyde Grieco/Extender: Skipper Cliche in Treatment: 0 Visit Information Patient Arrived: Ambulatory Arrival Time: 08:45 Accompanied By: self Transfer Assistance: None Patient Identification Verified: Yes Secondary Verification Process Completed: Yes Patient Requires Transmission-Based Precautions: No Patient Has Alerts: No History Since Last Visit All ordered tests and consults were completed: No Added or deleted any medications: No Any new allergies or adverse reactions: No Had a fall or experienced change in activities of daily living that may affect risk of falls: No Signs or symptoms of abuse/neglect since last visito No Hospitalized since last visit: No Implantable device outside of the clinic excluding cellular tissue based products placed in the center since last visit: No Has Dressing in Place as Prescribed: Yes Electronic Signature(s) Signed:  01/29/2022 4:03:12 PM By: Carlene Coria RN Entered By: Carlene Coria on 01/29/2022 08:45:35 [name] ([id number]) -------------------------------------------------------------------------------- Clinic Level of Care Assessment Details Patient Name: [name]. Date of Service: 01/29/2022 8:45 AM Medical Record Number: [id number] Patient Account Number: [id number] Date of Birth/Sex: 1960/09/26 (61 y.o. M) Treating RN: Carlene Coria Primary Care Ayde Record: [name] Other Clinician: Referring Jaiel Saraceno: Referral, Self Treating Jossalin Chervenak/Extender: Skipper Cliche in Treatment: 0 Clinic Level of Care Assessment Items TOOL 1 Quantity Score X - Use when EandM and Procedure is performed on INITIAL visit 1 0 ASSESSMENTS - Nursing Assessment / Reassessment X - General Physical Exam (combine w/ comprehensive assessment (listed just below) when performed on new 1 20 pt. evals) X- 1 25 Comprehensive Assessment (HX, ROS, Risk Assessments, Wounds Hx, etc.) ASSESSMENTS - Wound and Skin Assessment / Reassessment _0  - Dermatologic / Skin Assessment (not related to wound area) 0 ASSESSMENTS - Ostomy and/or Continence Assessment and Care _1  - Incontinence Assessment and Management 0 _2  - 0 Ostomy Care Assessment and Management (repouching, etc.) PROCESS - Coordination of Care X - Simple Patient / Family Education for ongoing care 1 15 _3  - 0 Complex (extensive) Patient / Family Education for ongoing care X- 1 10 Staff obtains Programmer, systems, Records, Test Results / Process Orders _4  - 0 Staff telephones HHA, Nursing Homes / Clarify orders / etc _5  - 0 Routine Transfer to another Facility (non-emergent condition) _6  - 0 Routine Hospital Admission (non-emergent condition) X- 1 15 New Admissions / Biomedical engineer / Ordering NPWT, Apligraf, etc. _7  - 0 Emergency Hospital Admission (emergent condition) PROCESS - Special Needs _8  - Pediatric / Minor Patient Management 0 _9  -  0 Isolation Patient Management _10  - 0 Hearing / Language / Visual special needs _11  - 0 Assessment of Community assistance (transportation, D/C planning, etc.) _12  - 0 Additional assistance / Altered mentation _13  - 0 Support Surface(s) Assessment (bed, cushion, seat, etc.) INTERVENTIONS - Miscellaneous _14  - External ear exam 0 _15  -  0 Patient Transfer (multiple staff / Civil Service fast streamer / Similar devices) _0  - 0 Simple Staple / Suture removal (25 or less) _1  - 0 Complex Staple / Suture removal (26 or more) _2  - 0 Hypo/Hyperglycemic Management (do not check if billed separately) _3  - 0 Ankle / Brachial Index (ABI) - do not check if billed separately Has the patient been seen at the hospital within the last three years: Yes Total Score: 85 Level Of Care: New/Established - Level 3 BRAXTON, VANTREASE ([id number]) Electronic Signature(s) Signed: 01/29/2022 4:03:12 PM By: Carlene Coria RN Entered By: Carlene Coria on 01/29/2022 09:56:38 [name] ([id number]) -------------------------------------------------------------------------------- Encounter Discharge Information Details Patient Name: [name]. Date of Service: 01/29/2022 8:45 AM Medical Record Number: [id number] Patient Account Number: [id number] Date of Birth/Sex: 10/06/1960 (61 y.o. M) Treating RN: Carlene Coria Primary Care Kimarie Coor: [name] Other Clinician: Referring Dotsie Gillette: Referral, Self Treating Garrie Woodin/Extender: Skipper Cliche in Treatment: 0 Encounter Discharge Information Items Post Procedure Vitals Discharge Condition: Stable Temperature (F): 98.3 Ambulatory Status: Ambulatory Pulse (bpm): 93 Discharge Destination: Home Respiratory Rate (breaths/min): 18 Transportation: Private Auto Blood Pressure (mmHg): 175/89 Accompanied By: self Schedule Follow-up Appointment: Yes Clinical Summary of Care: Electronic Signature(s) Signed: 01/29/2022 4:03:12 PM By: Carlene Coria RN Entered By: Carlene Coria on  01/29/2022 10:04:48 [name] ([id number]) -------------------------------------------------------------------------------- Lower Extremity Assessment Details Patient Name: [name]. Date of Service: 01/29/2022 8:45 AM Medical Record Number: [id number] Patient Account Number: [id number] Date of Birth/Sex: May 29, 1961 (61 y.o. M) Treating RN: Carlene Coria Primary Care Isadore Bokhari: [name] Other Clinician: Referring Terri Rorrer: Referral, Self Treating Rose Hippler/Extender: Skipper Cliche in Treatment: 0 Edema Assessment Assessed: [Left: No] [Right: No] [Left: Edema] [Right: :] Calf Left: Right: Point of Measurement: 35 cm From Medial Instep 38 cm Ankle Left: Right: Point of Measurement: 12 cm From Medial Instep 23 cm Knee To Floor Left: Right: From Medial Instep 47 cm Vascular Assessment Pulses: Dorsalis Pedis Palpable: [Left:Yes] Electronic Signature(s) Signed: 01/29/2022 4:03:12 PM By: Carlene Coria RN Entered By: Carlene Coria on 01/29/2022 09:13:37 [name] ([id number]) -------------------------------------------------------------------------------- Multi Wound Chart Details Patient Name: [name]. Date of Service: 01/29/2022 8:45 AM Medical Record Number: [id number] Patient Account Number: [id number] Date of Birth/Sex: [birth year] (61 y.o. M) Treating RN: Carlene Coria Primary Care Korby Ratay: [name] Other Clinician: Referring Kerline Trahan: Referral, Self Treating Selby Slovacek/Extender: Skipper Cliche in Treatment: 0 Vital Signs Height(in): 70 Pulse(bpm): 93 Weight(lbs): 255 Blood Pressure(mmHg): 175/89 Body Mass Index(BMI): 36.6 Temperature(F): 98.3 Respiratory Rate(breaths/min): 18 Photos: [N/A:N/A] Wound Location: Left, Lateral Foot N/A N/A Wounding Event: Blister N/A N/A Primary Etiology: Dehisced Wound N/A N/A Comorbid History: Hypertension, Type II Diabetes, N/A N/A Neuropathy Date Acquired: 10/29/2021 N/A N/A Weeks of Treatment: 0  N/A N/A Wound Status: Open N/A N/A Wound Recurrence: No N/A N/A Pending Amputation on Yes N/A N/A Presentation: Measurements L x W x D (cm) 2.5x5.5x2.5 N/A N/A Area (cm) : 10.799 N/A N/A Volume (cm) : 26.998 N/A N/A Starting Position 1 (o'clock): 9 Ending Position 1 (o'clock): 12 Maximum Distance 1 (cm): 3.8 Undermining: Yes N/A N/A Classification: Full Thickness With Exposed N/A N/A Support Structures Exudate Amount: Medium N/A N/A Exudate Type: Sanguinous N/A N/A Exudate Color: red N/A N/A Granulation Amount: Medium (34-66%) N/A N/A Granulation Quality: Red, Pink N/A N/A Necrotic Amount: Medium (34-66%) N/A N/A Exposed Structures: Fat Layer (Subcutaneous Tissue): N/A N/A Yes Bone: Yes Fascia: No Tendon: No Muscle: No Joint: No Epithelialization: None N/A N/A Treatment Notes Electronic Signature(s) Signed:  01/29/2022 4:03:12 PM By: Carlene Coria RN [name] ([id number]) Entered By: Carlene Coria on 01/29/2022 09:48:52 [name] ([id number]) -------------------------------------------------------------------------------- [place] Details Patient Name: RANDELL, TEARE. Date of Service: 01/29/2022 8:45 AM Medical Record Number: [id number] Patient Account Number: [id number] Date of Birth/Sex: 10-14-1960 (61 y.o. M) Treating RN: Carlene Coria Primary Care Teruko Joswick: [name] Other Clinician: Referring Pearle Wandler: Referral, Self Treating Kallista Pae/Extender: Skipper Cliche in Treatment: 0 Active Inactive Wound/Skin Impairment Nursing Diagnoses: Knowledge deficit related to ulceration/compromised skin integrity Goals: Patient/caregiver will verbalize understanding of skin care regimen Date Initiated: 01/29/2022 Target Resolution Date: 03/01/2022 Goal Status: Active Ulcer/skin breakdown will have a volume reduction of 30% by week 4 Date Initiated: 01/29/2022 Target Resolution Date: 03/01/2022 Goal Status: Active Ulcer/skin breakdown will have  a volume reduction of 50% by week 8 Date Initiated: 01/29/2022 Target Resolution Date: 03/31/2022 Goal Status: Active Ulcer/skin breakdown will have a volume reduction of 80% by week 12 Date Initiated: 01/29/2022 Target Resolution Date: 05/01/2022 Goal Status: Active Ulcer/skin breakdown will heal within 14 weeks Date Initiated: 01/29/2022 Target Resolution Date: 05/01/2022 Goal Status: Active Interventions: Assess patient/caregiver ability to obtain necessary supplies Assess patient/caregiver ability to perform ulcer/skin care regimen upon admission and as needed Assess ulceration(s) every visit Notes: Electronic Signature(s) Signed: 01/29/2022 4:03:12 PM By: Carlene Coria RN Entered By: Carlene Coria on 01/29/2022 09:48:08 [name] ([id number]) -------------------------------------------------------------------------------- Pain Assessment Details Patient Name: [name]. Date of Service: 01/29/2022 8:45 AM Medical Record Number: [id number] Patient Account Number: [id number] Date of Birth/Sex: 1960/09/15 (61 y.o. M) Treating RN: Carlene Coria Primary Care Judianne Seiple: [name] Other Clinician: Referring Zilda No: Referral, Self Treating Adom Schoeneck/Extender: Skipper Cliche in Treatment: 0 Active Problems Location of Pain Severity and Description of Pain Patient Has Paino Yes Site Locations With Dressing Change: Yes Duration of the Pain. Constant / Intermittento Constant Rate the pain. Current Pain Level: 7 Worst Pain Level: 10 Least Pain Level: 7 Tolerable Pain Level: 5 Character of Pain Describe the Pain: Aching, Burning Pain Management and Medication Current Pain Management: Medication: Yes Cold Application: No Rest: Yes Massage: No Activity: No T.E.N.S.: No Heat Application: No Leg drop or elevation: No Is the Current Pain Management Adequate: Inadequate How does your wound impact your activities of daily livingo Sleep: Yes Bathing: No Appetite:  No Relationship With Others: No Bladder Continence: No Emotions: No Bowel Continence: No Work: No Toileting: No Drive: No Dressing: No Hobbies: No Electronic Signature(s) Signed: 01/29/2022 4:03:12 PM By: Carlene Coria RN Entered By: Carlene Coria on 01/29/2022 08:48:13 [name] ([id number]) -------------------------------------------------------------------------------- Patient/Caregiver Education Details Patient Name: [name]. Date of Service: 01/29/2022 8:45 AM Medical Record Number: [id number] Patient Account Number: [id number] Date of Birth/Gender: Apr 28, 1961 (61 y.o. M) Treating RN: Carlene Coria Primary Care Physician: [name] Other Clinician: Referring Physician: Referral, Self Treating Physician/Extender: Skipper Cliche in Treatment: 0 Education Assessment Education Provided To: Patient Education Topics Provided Wound/Skin Impairment: Methods: Explain/Verbal Responses: State content correctly Electronic Signature(s) Signed: 01/29/2022 4:03:12 PM By: Carlene Coria RN Entered By: Carlene Coria on 01/29/2022 09:56:52 [name] ([id number]) -------------------------------------------------------------------------------- Wound Assessment Details Patient Name: [name]. Date of Service: 01/29/2022 8:45 AM Medical Record Number: [id number] Patient Account Number: [id number] Date of Birth/Sex: 04/07/1961 (61 y.o. M) Treating RN: Carlene Coria Primary Care Ryken Paschal: [name] Other Clinician: Referring Teyton Pattillo: Referral, Self Treating Janaya Broy/Extender: Skipper Cliche in Treatment: 0 Wound Status Wound Number: 14 Primary Etiology: Diabetic Wound/Ulcer of the Lower Extremity Wound  Location: Left, Lateral Foot Secondary Etiology: Dehisced Wound Wounding Event: Blister Wound Status: Open Date Acquired: 10/29/2021 Comorbid History: Hypertension, Type II Diabetes, Neuropathy Weeks Of Treatment: 0 Clustered Wound: No Pending Amputation  On Presentation Photos Wound Measurements Length: (cm) 2.5 % Redu Width: (cm) 5.5 % Redu Depth: (cm) 2.5 Epithe Area: (cm) 10.799 Under Volume: (cm) 26.998 St End Max ction in Area: 0% ction in Volume: 0% lialization: None mining: Yes arting Position (o'clock): 9 ing Position (o'clock): 12 imum Distance: (cm) 3.8 Wound Description Classification: Grade 3 Foul O Wagner Verification: MRI Slough Exudate Amount: Medium Exudate Type: Sanguinous Exudate Color: red dor After Cleansing: No /Fibrino Yes Wound Bed Granulation Amount: Medium (34-66%) Exposed Structure Granulation Quality: Red, Pink Fascia Exposed: No Necrotic Amount: Medium (34-66%) Fat Layer (Subcutaneous Tissue) Exposed: Yes Necrotic Quality: Adherent Slough Tendon Exposed: No Muscle Exposed: No Joint Exposed: No Bone Exposed: Yes Treatment Notes Wound #14 (Foot) Wound Laterality: Left, Lateral Cleanser TADAN, SHILL ([id number]) [place] Ancillary Kit - 15 Day Supply Discharge Instruction: Use supplies as instructed; Kit contains: (15) Saline Bullets; (15) 3x3 Gauze; 15 pr Gloves Soap and Water Discharge Instruction: Gently cleanse wound with antibacterial soap, rinse and pat dry prior to dressing wounds Wound Cleanser Discharge Instruction: Wash your hands with soap and water. Remove old dressing, discard into plastic bag and place into trash. Cleanse the wound with Wound Cleanser prior to applying a clean dressing using gauze sponges, not tissues or cotton balls. Do not scrub or use excessive force. Pat dry using gauze sponges, not tissue or cotton balls. Peri-Wound Care Topical Primary Dressing Silvercel 4 1/4x 4 1/4 (in/in) Discharge Instruction: Apply Silvercel 4 1/4x 4 1/4 (in/in) as instructed Secondary Dressing ABD Pad 5x9 (in/in) Discharge Instruction: Cover with ABD pad Secured With [place] Soft Cloth Surgical Tape, 2x2 (in/yd) Kerlix Roll Sterile or Non-Sterile 6-ply  4.5x4 (yd/yd) Discharge Instruction: Apply Kerlix as directed Compression Wrap Compression Stockings Add-Ons Electronic Signature(s) Signed: 01/29/2022 5:25:17 PM By: Gretta Cool, BSN, RN, CWS, Kim RN, BSN Signed: 02/04/2022 8:34:39 AM By: Carlene Coria RN Previous Signature: 01/29/2022 4:03:12 PM Version By: Carlene Coria RN Entered By: Gretta Cool, BSN, RN, CWS, Kim on 01/29/2022 17:25:16 [name] ([id number]) -------------------------------------------------------------------------------- Vitals Details Patient Name: THURMON, MIZELL. Date of Service: 01/29/2022 8:45 AM Medical Record Number: [id number] Patient Account Number: [id number] Date of Birth/Sex: [birth year] (62 y.o. M) Treating RN: Carlene Coria Primary Care Aloha Bartok: [name] Other Clinician: Referring Talishia Betzler: Referral, Self Treating Ebonye Reade/Extender: Skipper Cliche in Treatment: 0 Vital Signs Time Taken: 08:48 Temperature (F): 98.3 Height (in): 70 Pulse (bpm): 93 Source: Stated Respiratory Rate (breaths/min): 18 Weight (lbs): 255 Blood Pressure (mmHg): 175/89 Source: Stated Reference Range: 80 - 120 mg / dl Body Mass Index (BMI): 36.6 Electronic Signature(s) Signed: 01/29/2022 4:03:12 PM By: Carlene Coria RN Entered By: Carlene Coria on 01/29/2022 58:52:77

## 2022-02-02 LAB — AEROBIC CULTURE W GRAM STAIN (SUPERFICIAL SPECIMEN): Gram Stain: NONE SEEN

## 2022-02-04 ENCOUNTER — Encounter: Payer: 59 | Admitting: Physician Assistant

## 2022-02-04 DIAGNOSIS — M86372 Chronic multifocal osteomyelitis, left ankle and foot: Secondary | ICD-10-CM | POA: Diagnosis not present

## 2022-02-04 NOTE — Progress Notes (Addendum)
RYOMA, NOFZIGER ([id number]) Visit Report for 02/04/2022 Chief Complaint Document Details Patient Name: [name], [name]. Date of Service: 02/04/2022 3:30 PM Medical Record Number: [id number] Patient Account Number: [id number] Date of Birth/Sex: 1960/07/07 (61 y.o. M) Treating RN: Carlene Coria Primary Care Provider: [name] Other Clinician: Referring Provider: [name] Treating Provider/Extender: Skipper Cliche in Treatment: 0 Information Obtained from: Patient Chief Complaint Left lateral foot ulcer Electronic Signature(s) Signed: 02/04/2022 3:11:26 PM By: Worthy Keeler PA-C Entered By: Worthy Keeler on 02/04/2022 15:11:26 [name] ([id number]) -------------------------------------------------------------------------------- HPI Details Patient Name: [name]. Date of Service: 02/04/2022 3:30 PM Medical Record Number: [id number] Patient Account Number: [id number] Date of Birth/Sex: 1960-07-08 (61 y.o. M) Treating RN: Carlene Coria Primary Care Provider: [name] Other Clinician: Referring Provider: [name] Treating Provider/Extender: Skipper Cliche in Treatment: 0 History of Present Illness HPI Description: Pleasant 61 year old with history of diabetes (Hgb A1c 10.8 in 2014) and peripheral neuropathy. No PVD. L ABI 1.1. Status post right great toe partial amputation years ago. He was at work and on 10/22/2014, was injured by a cart, and suffered an ulceration to his left anterior calf. He says that it subsequently became infected, and he was treated with a course of antibiotics. He was found on initial exam to have an ulceration on the dorsum of his left third toe. He was unaware of this and attributes it to pressure from his steel toed boots. More recently he injured his right anterior calf on a cart. Ambulating normally per his baseline. He has been undergoing regular debridements, applying mupirocin cream, and an Ace wrap for edema  control. He returns to clinic for follow-up and is without complaints. No pain. No fever or chills. No drainage. 10/25/15; this is a 61 year old man who has type II diabetes with diabetic polyneuropathy. He tells me that he fractured his left fifth metatarsal in June 2016 when he presented with swelling. He does not recall a specific injury. His hemoglobin A1c was apparently too high at the time for consideration of surgery and he was put in some form of offloading. Ultimately he went to surgery in December with an allograft from his calcaneus to this site, plate and screws. He had an x-ray of the foot in March that showed concerns about nonunion. He tells me that in March he had to move and basically moved himself. He was on his foot a lot and then noticed some drainage from an open area. He has been following with his orthopedic surgeon Dr. Doran Durand. He has been applying a felt donut, dry dressing and using his heel healing sandal. 11/01/15; this is a patient I saw last week for the first time. He had a small open wound on the plantar aspect of his left foot at roughly the level of the base of his fifth metatarsal. He had a considerable degree of thickened skin around this wound on the plantar aspect which I thought was from chronic pressure on this area. He tells [place] that he had drainage over the course of the week. No systemic symptoms. 11/08/15; culture last week grew Citrobacter korseri. This should've been sensitive to the Augmentin I gave him. He has seen Dr. Doran Durand who did his initial surgery and according the patient the plan is to give this another month and then the hardware might need to come out of this. This seems like a reasonable plan. I will adjust his antibiotics to ciprofloxacin which probably should continue for at least  another 2 weeks. I gave him 10 days worth today 11/22/15 the patient has completed antibiotics. He has an appointment with Dr. Doran Durand this Friday. There is improved  dimensions around the wound on the left fifth metatarsal base 11/29/15; the patient has completed antibiotics last week. Apparently his appointment with Dr. Doran Durand it is not until this Friday. Dimensions are roughly the same. 12/06/15; saw Dr. Doran Durand. No x-ray told the end of the month, next appointment June 30. We have been using Aquacel Ag 12/13/15: No major change this week. Using Aquacel AG 621/17; arrived this week with maceration around the wound. There was quite a bit of undermining which required surgical debridement. I changed him to [facility] last week, by the patient's admission he was up on this more this week 12/27/15; macerated tissue around the wound is removed with a scalpel and pickups. There is no undermining. Nonviable subcutaneous tissue and skin taken from the superior circumference of the wound is slough from the surface. READMISSION 03/06/16 since I last saw this patient at the end of June, he went for surgery on 01/11/16 by Dr. Doran Durand of orthopedics. He had a left foot irrigation and debridement, removal of hardware and placement of wound VAC. He is also been followed by Dr. Megan Salon of infectious disease and completed a six-week course of IV Rocephin for group B strep and Enterobacter in the bone at the time of surgery. Apparently at the time of surgery the bone looked healthy so I don't think any bone was actually removed. He has been using silver alginate based dressings on the same wound area at the base of the left fifth metatarsal on the left. I note that he is also had arterial studies on 01/08/16, these showed a left ABI of 1.2 to and a right ABI of 1.3. Waveforms were listed as biphasic. He was not felt to have any specific arterial issues. 03/13/16; no real change in the condition of the wound at the left lateral foot at roughly the base of his fifth metatarsal. Use silver alginate last week. 03/18/16 arrives today with no open area. Being suspicious of the overlying  callus I. Some of this back although I see nothing but covering tissue here/epithelium. There is no surrounding tenderness READMISSION 04/16/16 this is a patient I discharged about a month ago. Initially a surgical wound on the lateral aspect of his left foot which subsequently became infected. The story he is giving today that he went back into his own modified shoe started to notice pain 2 weeks ago he was seen in Dr. Nona Dell office by a physician assistant last Wednesday and according the patient was told that everything looked fine however this is clearly now broken open and he has an open wound in the same spot that we have been dealing with repetitively. Situation is complicated by the fact that he is running short of money on long-term disability. He has not taken his insulin and at least 2 weeks was previously on NovoLog short acting insulin on a sliding scale and TRESIBA 35 units at bedtime. He is no longer able to afford any of his medication he was in the x-ray on 10/15. A plain x-ray showed healed fracture of the left fifth metatarsal bone status post removal of the associated plate and screw fixation hardware. There was no acute appearing osseous abnormality. His blood work showed a white count of 9.2 with an essentially normal differential comprehensive metabolic panel was normal. Previous CT scan of the foot  on 01/08/16 showed no osteomyelitis previous vascular workup showed no evidence of significant PAD on 01/13/16 04/23/16; culture I did last week grew enterococcus [ampicillin sensitive] and MRSA. He saw infectious disease yesterday. They stop the clindamycin and ordered an MRI. This is not unreasonable. All the hardware is out of the foot at this point. 04/30/16 at this point in time we are still awaiting the results of the MRI at this point in time. Patient did have an area which appears to be somewhat macerated in the proximal portion of the wound where there is overlying necrotic  skin that is doing nothing more than trapping fluid [name], [name]. ([id number]) underneath. He continues to state that he does have some discomfort and again the concern is for the possibility of osteomyelitis hence the reason for the MRI order. We have been using a silver alginate dressing but again I think the reason this with his macerated as it was is that the dressing obscene could not reach the entirety of the wound due to some necrotic skin covering the proximal portion. No bleeding noted at this point in time on initial evaluation. 05/07/16 we receive the results of patient's MRI which shows that he has no evidence of osteomyelitis. This obviously is excellent news. Patient was definitely happy to hear this. He tells me the wound appears to be doing very well at this point in time and is pleased with the progress currently. 05/15/16; as noted the patient did not have osteomyelitis. He has been released by infectious disease and orthopedics. His wound is still open he had a debridement last week but complain when he got home he "bleeding for half a day". He is not had any pain. We have been using silver alginate with Kerlix gauze wrap. 05/28/16; patient arrives today with the wound in much the same condition as last time. He has a small opening on the lateral aspect of his foot however with debridement there is clear undermining medially there is no real evidence of infection here and I didn't see any point in culturing this. One would have to wonder if this isn't a simple matter of in adequate offloading as he is been using a healing sandal. 06/05/16; the open area is now on the plantar aspect of his foot and not decide. The wound almost appears to have "migrated". This was the term use by our intake nurse. 06/12/16; open area on the plantar aspect of his foot. Base of the wound looks very healthy. This will be his second week in a total contact cast 06/19/16; patient arrived today in a  total contact cast. There was some expectation from our staff and myself that this area would be healed. Unfortunately the area was boggy and with rec pressure a fairly substantial amount of purulent drainage was obtained. Specimen obtained for culture. The patient had no complaints of systemic problems including fever or chills or instability of his diabetes. There was no pain in the foot. Nevertheless a extensive debridement was required. 06/26/16; patient's culture from the abscess last week grew a combination of MRSA and ampicillin sensitive enterococcus. I had him on Augmentin and Septra however I have elected to give him a full 10 day course of Zyvox instead as I Recently treated this combination of organisms with Augmentin and Septra before. Arrives today with no systemic symptoms 07/03/16; the patient has 2 more treatments of Zyvox and then he is finished antibiotics the wound has improved now mostly on the lateral aspect of  his foot. There is still some tenderness when he walks. 07/10/16; patient arrives today with Zyvox completed. He only has a small open area remaining. 07/24/16; she arrives here with no overt open area. The covering is thick callus/eschar. Nevertheless there is no open area here. He has some tenderness underneath the area but no overt infection is observed no drainage. The patient has a deformity in the foot with this area weekly to be exposed to more pressure in his foot where nevertheless that something we are going to have to deal with going forward. The patient has diabetic workboots and diabetic shoes. He has had a long difficult course with the area here. This started as a fracture at work. He had bone grafting from his calcaneus and screws. This got infected he had to have more surgery on the area. Bone at the time of that surgery I think showed enterococcus and group B strep. He had 6 weeks of Rocephin. Since then the area has waxed and waned in its difficulty.  Recently he had an MRI in December that did not show osteomyelitis nevertheless he had an abscess that grew MRSA and enterococcus which I elected to treat with Zyvox. This was in December and this wound is actually "healed" over 08/21/16; the patient came in today for his one-month follow-up visit. The area on the lateral aspect of his left foot looks much the same as some month ago. There is no evidence of an open wound here. However the patient tells me about a week after he went back to work he developed severe pain and swelling in the plantar aspect of his right foot first and fifth metatarsal heads. 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 He went to see his podiatrist at [facility]. She told him that he could not work on his feet. She told him to go back and his cam walking boot on the left. Not have open wounds obviously on the right. The patient is actually gone ahead and retired from his job because he does not feel he can work on his feet 09/18/16; patient comes back in saying he recently had some pain on the lateral aspect of his left foot. Asked that we look at this. Other than that all of his wounds have a viable surface. He has diabetic shoes. He is retired from work. READMISSION Edrees Valent is a 61 year old man who we cared for for a prolonged period of time in late 2017 into March 2018. At that point he had suffered a fracture of his left fifth metatarsal at about the level of the base of the fifth metatarsal. He had surgical repair by Dr. Meryl Dare and he developed a very refractory wound over the fifth metatarsal. Ultimately this became infected he required hardware removal this eventually closed over and we discharged from the clinic in March of this year. He tells me as well until roughly 3 weeks ago when he developed increasing pain in the same area making it difficult for him to sleep. He was  admitted to hospital from 9/5 through 9/7 with now an ulcer in the same area on the lateral aspect of his left foot at the level of the base of the metatarsal. X-rays and MRIs during this hospitalization were negative. Wound culture showed a combination of Escherichia coli, Morganella and Pseudomonas. He was given IV Cipro and vancomycin the hospital and then discharged home  on Cipro and Doxy. He returned to the ER on 9/16 with leg swelling and discomfort. I don't think anything was added at that point although he did have an ultrasound of the left leg that was negative for DVT. He was back in the ER on 10/4 again with pain in the area he was given Bactrim but states he had a significant allergic reaction to that which has abated once he stopped the Bactrim. I don't think he had a full course. He went to see his podiatrist yesterday who recommended some form of foot soak. He has a Darco forefoot offloading boot The patient is a type II diabetic on insulin. Tells me his recent hemoglobin A1c was 7. Arterial studies done in July 2017 showed an ABI in the right of 1.3 on the left 1.2 to biphasic waveforms bilaterally. He was not felt to have arterial disease. As noted the patient has had 2 MRIs most recently on 10/4 this showed no evidence of an abscess or osteomyelitis and no change since the prior study of 03/05/17 there was nonspecific edema on the dorsum of the foot. ABIs in this clinic today 1.2 which would be unchanged from his study done in July/17 04/15/17; now down to 1 small wound on the left lateral foot. We wrapped him and applied collagen last week and the foot is fairly macerated. 04/22/17; small wound on the left lateral foot however it has some undermining. Using collagen 04/29/17; small wound on the left lateral foot some surrounding callus. Chronic damage in this area as a result of initial fracture nonhealing and secondary infection. 05/06/17; the patient arrives today with the area on  the left lateral foot closed. There is thick subcutaneous tissue with a layer of callus. I took some of the callus off just to ensure adequate closure of the underlying tissue and there is no open wound here. He has considerable deformity of this area of his foot however and unless there is an ability to offload this I think opening is going to be recurrent. He tells me he has diabetic foot wear although I have not actually seen this Readmission: 09/02/17 on evaluation today patient appears to be doing a little better compared to what he tells me what's going on in regard to his left lateral foot. He was previously seen in the ER this past Saturday it is now Tuesday. On Saturday he actually was treated with antibiotics including Cipro 500 mg two times a day and doxycycline 100 mg two times a day. Subsequently he also did have an x-ray performed of his left foot which revealed increased degenerative changes of the base of the fifth metatarsal since comparison in 2018. There was no radiographic evidence of osteomyelitis however. Patient has previously had an MRI of the left foot which was performed on 04/03/17 in this revealed at that point a soft [name], [name]. ([id number]) tissue ulceration but no evidence of osteomyelitis. Patient has previously undergone testing in regard to his ABI's by Dr. Bridgett Larsson and this showed that he did have ABI was within normal limits which does correspond with ABI's we checked here in the office today. That study was on 01/13/16. With all that being said on evaluation today there does not appear to be any evidence of a true open wound of the left lateral fifth metatarsal region. He does have tenderness noted as well is a little bit of boggy/fluctuance feeling to the area in this region of his foot and there is definitely  a very solid and firm callous noted laterally. With that being said I am not able to find any obvious opening at this point there does appear to be a spot  where there appears to been a opening very recently however patient states he has not noted any drainage from this currently. He does however have discomfort with palpation of this area this is in the 3-4/10 range only with very firm palpation this is not too significant at all. Subsequently he does have a small ulcer at the medial nail bed of the right second toe where he inadvertently pulled off a portion of the toenail softly causing this injury. Otherwise there does not appear to be any evidence of infection in regard to this right second toe. 09/23/17 on evaluation today patient actually appears to be doing well he has no open ulcerations noted at this point. With that being said he did have significant callous noted over the left lateral foot which did require callous pairing today he tolerated this without complication. Readmission: 11/07/17 patient presents today for readmission concerning to ulcers that he has. One on the right plantar fifth metatarsal region and the other on the left plantar fifth metatarsal region. He has previously had an x-ray which was performed on 11/05/17 and revealed that the patient had wounds noted of the base of the fifth metatarsal region without evidence of destruction to the bone to suggest osteomyelitis. Obviously this is excellent news. With that being said he does have a significantly large ulcer that extensively plantar surface of the wound and is concerning for the fact that he continues to walk on this due to the patient telling me that he "has to work" I do believe this has likely led to both the opening of the wound as well as the fact that is not really able to heal very well at this point. He has been seen by his podiatrist and apparently according to the note dated 10/23/17 the patient was provided with Santyl and recommended a dry dressing. No wound culture has been obtained at this point. As stated above he has had returned to work due to financial  reasons and is not able to wear his postop shoes due to the fact that he cannot wear this and work. The wound is stated to have been present for "several weeks" prior to the visit with the podiatrist on 10/23/17. With that being said again at least the good news is there does not appear to be any evidence of osteomyelitis. 11/14/17 on evaluation today patient appears to be doing poorly in regard to his bilateral foot ulcers. Unfortunately he seems to have significant callous with a lot of maceration underlined especially on the right even more so than the left. He has been tolerating the dressing changes without complication. With that being said overall I do think he's gonna have to have a significant debridement today. 11/21/17 on evaluation today patient presents for follow-up concerning his bilateral lateral foot ulcers. He has been tolerating the dressing changes without complication. His wife helps perform these for him. With that being said the patient states that the left lateral foot ulcer actually is continuing to give him trouble as far as discomfort is concerned. With that being said actually appears better. 11/28/17 on evaluation today patient appears to be doing poorly in regard to his left lower extremity ulcer in particular. His right as made some progress on the lateral portion although the plantar portion is actually getting somewhat worse  I think this is likely due to the fact that he continues to work although it hurts and he's been having a lot of issues out of this side and as far as pain is concerned. With that being said my biggest concern on evaluation today is that of the patient having issues with increased pain in the left lower extremity ulcer site especially on the plantar aspect. He also appears to have what seems to be an abscess developing in the dorsal/lateral portion of his foot which I think likely is communicating somewhere internally with the ulcer site. Nonetheless he  has not had any fevers, chills, nausea, or vomiting noted at this time. He has however had fairly significant increase in pain and he was Artie having a lot of pain previous. Readmission: 01-29-2022 upon evaluation today patient presents for initial inspection here in our clinic concerning the current issue which has been present since around Oct 29, 2021. The patient tells me that he has at this point been seen by Dr. Vickki Muff who has been managing this up to this point. He has also been seen by Dr. Lazaro Arms I will outline the findings there shortly as well. With that being said currently according to Dr. Alvera Singh note on 6-20- 2023 the patient has a prescription for Zosyn that was given by infectious disease. Again that was back in June however. There was a hospital encounter for incision and drainage and debridement of the foot in order to clearway necrotic tissue. The patient also underwent vascular evaluation with Dr. Lucky Cowboy. Nonetheless on the postop visit on 12-18-2021 the patient was seeming to be doing quite well based on what Dr. Vickki Muff said but nonetheless since that time is really not made much progress and is concerned about losing his foot. He therefore requested a referral to wound care to be seen and evaluated and see if there is anything we can do to help and aid with getting this to heal. Fortunately there does not appear to be any evidence of active infection locally or systemically at this time which is great news. He tells me that he is currently using Betadine wet-to-dry dressings. He does not note any systemic infections but does have evidence of local infection based on what we are seeing. He has had a recent chest x-ray which showed that he had no evidence of acute cardiopulmonary disease that was performed on 12-12-2021. He also underwent vascular intervention with Dr. 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 Subsequently the patient states that Dr. Lucky Cowboy has mentioned that he would need to go back at some time with Dr. Lucky Cowboy said he would consider a posterior tibial approach for revascularization in the future but at this time because of kidney status and the amount of dye used he did not want to proceed any further at that point. Again that something that would likely be scheduled down the road. Nonetheless right now the patient does have an ABI on the left of 0.90 with a TBI of 0.6-0 but this was decreased compared to previous. His ABI on the right was 1.08. Patient did have as well MRI of the left foot which was performed on 12-12-2021 and this shows that he does have interval resection of the proximal third of the fifth metatarsal and area of osteomyelitis on prior  MRI. There is persistent ulceration of plantar lateral aspect at the resection site with a sinus tract extending towards the adjacent cuboid bone findings suspicious for osteomyelitis of the plantar lateral aspect of the cuboid and adjacent base of the fourth metatarsal possibly in the proximal most aspect of the remaining fifth metatarsal shaft as well. The ABI performed on 12-13-2021 showed that the left ABI was 0.83 which had previously been registered at 1.35 and the right ABI was 1.20. I think this is consistent with being able to heal but obviously would do better with vascular intervention not something that is going to be looked into in the future. This was noted above as well. Based on what I am seeing today I do believe that the patient is definitely a candidate for HBO therapy I think this may be in his best interest as I think he is at the point of being in a limb threatening situation. I do believe that he would benefit from hyperbarics along with continued antibiotics along with good wound care measures as well. 02-04-2022 upon evaluation today patient  appears to be doing still poorly in regard to his foot he is having a lot of drainage and he notes odor as well. With that being said we still have not heard anything back from insurance yet as far as approval for HBO therapy. Subsequently the patient does have evidence of osteomyelitis and I think hyperbarics is definitely going to be something that would be beneficial for him. He has been on antibiotics several times previous but unfortunately this just does not seem to be sustained enough we did do a culture it showed Pseudomonas as one of the primary organisms both organisms would be treated with Levaquin which I think is going to be the best way to go. I discussed that with the patient today and I Georgina Peer send that into the pharmacy. [name], SCHAPPELL ([id number]) Electronic Signature(s) Signed: 02/04/2022 3:39:12 PM By: Worthy Keeler PA-C Entered By: Worthy Keeler on 02/04/2022 15:39:11 [name], [name] ([id number]) -------------------------------------------------------------------------------- Physical Exam Details Patient Name: JAKYE, MULLENS. Date of Service: 02/04/2022 3:30 PM Medical Record Number: [id number] Patient Account Number: [id number] Date of Birth/Sex: Dec 26, 1960 (61 y.o. M) Treating RN: Carlene Coria Primary Care Provider: [name] Other Clinician: Referring Provider: [name] Treating Provider/Extender: Skipper Cliche in Treatment: 0 Constitutional Well-nourished and well-hydrated in no acute distress. Respiratory normal breathing without difficulty. Psychiatric this patient is able to make decisions and demonstrates good insight into disease process. Alert and Oriented x 3. pleasant and cooperative. Notes Upon evaluation today patient's wound still has a lot of drainage at this point. Fortunately I do not see any evidence of systemic infection though locally there is definitely signs of infection from odor to appearance as well. I think that we need to  try to get this under better control. Electronic Signature(s) Signed: 02/04/2022 3:39:43 PM By: Worthy Keeler PA-C Entered By: Worthy Keeler on 02/04/2022 15:39:43 [name] ([id number]) -------------------------------------------------------------------------------- Physician Orders Details Patient Name: [name], [name]. Date of Service: 02/04/2022 3:30 PM Medical Record Number: [id number] Patient Account Number: [id number] Date of Birth/Sex: 09/30/1960 (61 y.o. M) Treating RN: Carlene Coria Primary Care Provider: [name] Other Clinician: Referring Provider: [name] Treating Provider/Extender: Skipper Cliche in Treatment: 0 Verbal / Phone Orders: No Diagnosis Coding ICD-10 Coding Code Description [phone] Chronic multifocal osteomyelitis, left ankle and foot E11.621 Type 2 diabetes mellitus with foot ulcer T81.31XA Disruption of external  operation (surgical) wound, not elsewhere classified, initial encounter L97.524 Non-pressure chronic ulcer of other part of left foot with necrosis of bone Follow-up Appointments o Return Appointment in 1 week. Bathing/ Shower/ Hygiene o May shower with wound dressing protected with water repellent cover or cast protector. Anesthetic (Use 'Patient Medications' Section for Anesthetic Order Entry) o Lidocaine applied to wound bed Edema Control - Lymphedema / Segmental Compressive Device / Other o Elevate, Exercise Daily and Avoid Standing for Long Periods of Time. o Elevate legs to the level of the heart and pump ankles as often as possible o Elevate leg(s) parallel to the floor when sitting. Off-Loading o Open toe surgical shoe - left o Other: - knee scotter Hyperbaric Oxygen Therapy o Evaluate for HBO Therapy o Indication and location: o If appropriate for treatment, begin HBOT per protocol: o 2.5 ATA for 90 Minutes with 2 Five (5) Minute Air Breaks o One treatment per day (delivered Monday  through Friday unless otherwise specified in Special Instructions below): o Total # of Treatments: - 40 o Antihistamine 30 minutes prior to HBO Treatment, difficulty clearing ears. o Finger stick Blood Glucose Pre- and Post- HBOT Treatment. o Follow Hyperbaric Oxygen Glycemia Protocol Wound Treatment Wound #14 - Foot Wound Laterality: Left, Lateral Cleanser: [employer]ram Ancillary Kit - 15 Day Supply (Generic) 3 x Per Week/30 Days Discharge Instructions: Use supplies as instructed; Kit contains: (15) Saline Bullets; (15) 3x3 Gauze; 15 pr Gloves Cleanser: Soap and Water 3 x Per Week/30 Days Discharge Instructions: Gently cleanse wound with antibacterial soap, rinse and pat dry prior to dressing wounds Cleanser: Wound Cleanser 3 x Per Week/30 Days Discharge Instructions: Wash your hands with soap and water. Remove old dressing, discard into plastic bag and place into trash. Cleanse the wound with Wound Cleanser prior to applying a clean dressing using gauze sponges, not tissues or cotton balls. Do not scrub or use excessive force. Pat dry using gauze sponges, not tissue or cotton balls. Primary Dressing: Silvercel 4 1/4x 4 1/4 (in/in) (Generic) 3 x Per Week/30 Days Discharge Instructions: Apply Silvercel 4 1/4x 4 1/4 (in/in) as instructed Secondary Dressing: ABD Pad 5x9 (in/in) (Generic) 3 x Per Week/30 Days Discharge Instructions: Cover with ABD pad [name], [name] ([id number]) Secured With: [employer] Surgical Tape, 2x2 (in/yd) (Generic) 3 x Per Week/30 Days Secured With: [employer] Sterile or Non-Sterile 6-ply 4.5x4 (yd/yd) (Generic) 3 x Per Week/30 Days Discharge Instructions: Apply Kerlix as directed Patient Medications Allergies: Bactrim Notifications Medication Indication Start End levofloxacin 02/04/2022 DOSE 1 - oral 750 mg tablet - 1 tablet oral taken 1 time per day for 30 days. GLYCEMIA INTERVENTIONS PROTOCOL PRE-HBO GLYCEMIA INTERVENTIONS ACTION  INTERVENTION Obtain pre-HBO capillary blood glucose (ensure 1 physician order is in chart). A. Notify HBO physician and await physician orders. 2 If result is 70 mg/dl or below: B. If the result meets the hospital definition of a critical result, follow hospital policy. A. Give patient an 8 ounce Glucerna Shake, an 8 ounce Ensure, or 8 ounces of a Glucerna/Ensure equivalent dietary supplement*. B. Wait 30 minutes. If result is 71 mg/dl to 130 mg/dl: C. Retest patientos capillary blood glucose (CBG). D. If result greater than or equal to 110 mg/dl, proceed with HBO. If result less than 110 mg/dl, notify HBO physician and consider holding HBO. If result is 131 mg/dl to 249 mg/dl: A. Proceed with HBO. A. Notify HBO physician and await physician orders. B. It is recommended to  hold HBO and do If result is 250 mg/dl or greater: blood/urine ketone testing. C. If the result meets the hospital definition of a critical result, follow hospital policy. POST-HBO GLYCEMIA INTERVENTIONS ACTION INTERVENTION Obtain post HBO capillary blood glucose (ensure 1 physician order is in chart). A. Notify HBO physician and await physician orders. 2 If result is 70 mg/dl or below: B. If the result meets the hospital definition of a critical result, follow hospital policy. A. Give patient an 8 ounce Glucerna Shake, an 8 ounce Ensure, or 8 ounces of a Glucerna/Ensure equivalent dietary supplement*. B. Wait 15 minutes for symptoms of hypoglycemia (i.e. nervousness, anxiety, If result is 71 mg/dl to 100 mg/dl: sweating, chills, clamminess, irritability, confusion, tachycardia or dizziness). C. If patient asymptomatic, discharge patient. If patient symptomatic, repeat capillary blood glucose (CBG) and notify HBO physician. If result is 101 mg/dl to 249 mg/dl: A. Discharge patient. A. Notify HBO physician and await physician orders. B. It is recommended to do blood/urine If result is  250 mg/dl or greater: ketone testing. C. If the result meets the hospital definition of a critical result, follow hospital policy. *Juice or candies are NOT equivalent products. If patient refuses the Glucerna or Ensure, please consult the hospital dietitian for an appropriate substitute. [name], [name] ([id number]) Electronic Signature(s) Signed: 02/28/2022 5:40:47 PM By: Worthy Keeler PA-C Signed: 03/18/2022 2:32:45 PM By: Carlene Coria RN Previous Signature: 02/04/2022 3:40:42 PM Version By: Worthy Keeler PA-C Entered By: Carlene Coria on 02/11/2022 10:50:50 [name] ([id number]) -------------------------------------------------------------------------------- Problem List Details Patient Name: [name], [name]. Date of Service: 02/04/2022 3:30 PM Medical Record Number: [id number] Patient Account Number: [id number] Date of Birth/Sex: 1961/06/09 (60 y.o. 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 Date of Service: 02/04/2022 3:30 PM Medical Record Number: [id number] Patient Account  Number: [id number] Date of Birth/Sex: April 06, 1961 (61 y.o. M) Treating RN: Carlene Coria Primary Care Provider: [name] Other Clinician: Referring Provider: [name] Treating Provider/Extender: Skipper Cliche in Treatment: 0 Subjective Chief Complaint Information obtained from Patient Left lateral foot ulcer History of Present Illness (HPI) Pleasant 61 year old with history of diabetes (Hgb A1c 10.8 in 2014) and peripheral neuropathy. No PVD. L ABI 1.1. Status post right great toe partial amputation years ago. He was at work and on 10/22/2014, was injured by a cart, and suffered an ulceration to his left anterior calf. He says that it subsequently became infected, and he was treated with a course of antibiotics. He was found on initial exam to have an ulceration on the dorsum of his left third toe. He was unaware of this and attributes it to pressure from his steel toed boots. More recently he injured his right anterior calf on a cart. Ambulating normally per his baseline. He has been undergoing regular debridements, applying mupirocin cream,  and an Ace wrap for edema control. He returns to clinic for follow-up and is without complaints. No pain. No fever or chills. No drainage. 10/25/15; this is a 61 year old man who has type II diabetes with diabetic polyneuropathy. He tells me that he fractured his left fifth metatarsal in June 2016 when he presented with swelling. He does not recall a specific injury. His hemoglobin A1c was apparently too high at the time for consideration of surgery and he was put in some form of offloading. Ultimately he went to surgery in December with an allograft from his calcaneus to this site, plate and screws. He had an x-ray of the foot in March that showed concerns about nonunion. He tells me that in March he had to move and basically moved himself. He was on his foot a lot and then noticed some drainage from an open area. He has been following  with his orthopedic surgeon Dr. Doran Durand. He has been applying a felt donut, dry dressing and using his heel healing sandal. 11/01/15; this is a patient I saw last week for the first time. He had a small open wound on the plantar aspect of his left foot at roughly the level of the base of his fifth metatarsal. He had a considerable degree of thickened skin around this wound on the plantar aspect which I thought was from chronic pressure on this area. He tells [place] that he had drainage over the course of the week. No systemic symptoms. 11/08/15; culture last week grew Citrobacter korseri. This should've been sensitive to the Augmentin I gave him. He has seen Dr. Doran Durand who did his initial surgery and according the patient the plan is to give this another month and then the hardware might need to come out of this. This seems like a reasonable plan. I will adjust his antibiotics to ciprofloxacin which probably should continue for at least another 2 weeks. I gave him 10 days worth today 11/22/15 the patient has completed antibiotics. He has an appointment with Dr. Doran Durand this Friday. There is improved dimensions around the wound on the left fifth metatarsal base 11/29/15; the patient has completed antibiotics last week. Apparently his appointment with Dr. Doran Durand it is not until this Friday. Dimensions are roughly the same. 12/06/15; saw Dr. Doran Durand. No x-ray told the end of the month, next appointment June 30. We have been using Aquacel Ag 12/13/15: No major change this week. Using Aquacel AG 621/17; arrived this week with maceration around the wound. There was quite a bit of undermining which required surgical debridement. I changed him to [facility] last week, by the patient's admission he was up on this more this week 12/27/15; macerated tissue around the wound is removed with a scalpel and pickups. There is no undermining. Nonviable subcutaneous tissue and skin taken from the superior circumference of the  wound is slough from the surface. READMISSION 03/06/16 since I last saw this patient at the end of June, he went for surgery on 01/11/16 by Dr. Doran Durand of orthopedics. He had a left foot irrigation and debridement, removal of hardware and placement of wound VAC. He is also been followed by Dr. Megan Salon of infectious disease and completed a six-week course of IV Rocephin for group B strep and Enterobacter in the bone at the time of surgery. Apparently at the time of surgery the bone looked healthy so I don't think any bone was actually removed. He has been using silver alginate based dressings on the same wound area  at the base of the left fifth metatarsal on the left. I note that he is also had arterial studies on 01/08/16, these showed a left ABI of 1.2 to and a right ABI of 1.3. Waveforms were listed as biphasic. He was not felt to have any specific arterial issues. 03/13/16; no real change in the condition of the wound at the left lateral foot at roughly the base of his fifth metatarsal. Use silver alginate last week. 03/18/16 arrives today with no open area. Being suspicious of the overlying callus I. Some of this back although I see nothing but covering tissue here/epithelium. There is no surrounding tenderness READMISSION 04/16/16 this is a patient I discharged about a month ago. Initially a surgical wound on the lateral aspect of his left foot which subsequently became infected. The story he is giving today that he went back into his own modified shoe started to notice pain 2 weeks ago he was seen in Dr. Nona Dell office by a physician assistant last Wednesday and according the patient was told that everything looked fine however this is clearly now broken open and he has an open wound in the same spot that we have been dealing with repetitively. Situation is complicated by the fact that he is running short of money on long-term disability. He has not taken his insulin and at least 2 weeks was  previously on NovoLog short acting insulin on a sliding scale and TRESIBA 35 units at bedtime. He is no longer able to afford any of his medication he was in the x-ray on 10/15. A plain x-ray showed healed fracture of the left fifth metatarsal bone status post removal of the associated plate and screw fixation hardware. There was no acute appearing osseous abnormality. His blood work showed a white count of 9.2 with an essentially normal differential comprehensive metabolic panel was normal. Previous CT scan of the foot on 01/08/16 showed no osteomyelitis previous vascular workup showed no evidence of significant PAD on 01/13/16 [name], [name] ([id number]) 04/23/16; culture I did last week grew enterococcus [ampicillin sensitive] and MRSA. He saw infectious disease yesterday. They stop the clindamycin and ordered an MRI. This is not unreasonable. All the hardware is out of the foot at this point. 04/30/16 at this point in time we are still awaiting the results of the MRI at this point in time. Patient did have an area which appears to be somewhat macerated in the proximal portion of the wound where there is overlying necrotic skin that is doing nothing more than trapping fluid underneath. He continues to state that he does have some discomfort and again the concern is for the possibility of osteomyelitis hence the reason for the MRI order. We have been using a silver alginate dressing but again I think the reason this with his macerated as it was is that the dressing obscene could not reach the entirety of the wound due to some necrotic skin covering the proximal portion. No bleeding noted at this point in time on initial evaluation. 05/07/16 we receive the results of patient's MRI which shows that he has no evidence of osteomyelitis. This obviously is excellent news. Patient was definitely happy to hear this. He tells me the wound appears to be doing very well at this point in time and is pleased with  the progress currently. 05/15/16; as noted the patient did not have osteomyelitis. He has been released by infectious disease and orthopedics. His wound is still open he had a debridement last  week but complain when he got home he "bleeding for half a day". He is not had any pain. We have been using silver alginate with Kerlix gauze wrap. 05/28/16; patient arrives today with the wound in much the same condition as last time. He has a small opening on the lateral aspect of his foot however with debridement there is clear undermining medially there is no real evidence of infection here and I didn't see any point in culturing this. One would have to wonder if this isn't a simple matter of in adequate offloading as he is been using a healing sandal. 06/05/16; the open area is now on the plantar aspect of his foot and not decide. The wound almost appears to have "migrated". This was the term use by our intake nurse. 06/12/16; open area on the plantar aspect of his foot. Base of the wound looks very healthy. This will be his second week in a total contact cast 06/19/16; patient arrived today in a total contact cast. There was some expectation from our staff and myself that this area would be healed. Unfortunately the area was boggy and with rec pressure a fairly substantial amount of purulent drainage was obtained. Specimen obtained for culture. The patient had no complaints of systemic problems including fever or chills or instability of his diabetes. There was no pain in the foot. Nevertheless a extensive debridement was required. 06/26/16; patient's culture from the abscess last week grew a combination of MRSA and ampicillin sensitive enterococcus. I had him on Augmentin and Septra however I have elected to give him a full 10 day course of Zyvox instead as I Recently treated this combination of organisms with Augmentin and Septra before. Arrives today with no systemic symptoms 07/03/16; the patient has 2  more treatments of Zyvox and then he is finished antibiotics the wound has improved now mostly on the lateral aspect of his foot. There is still some tenderness when he walks. 07/10/16; patient arrives today with Zyvox completed. He only has a small open area remaining. 07/24/16; she arrives here with no overt open area. The covering is thick callus/eschar. Nevertheless there is no open area here. He has some tenderness underneath the area but no overt infection is observed no drainage. The patient has a deformity in the foot with this area weekly to be exposed to more pressure in his foot where nevertheless that something we are going to have to deal with going forward. The patient has diabetic workboots and diabetic shoes. He has had a long difficult course with the area here. This started as a fracture at work. He had bone grafting from his calcaneus and screws. This got infected he had to have more surgery on the area. Bone at the time of that surgery I think showed enterococcus and group B strep. He had 6 weeks of Rocephin. Since then the area has waxed and waned in its difficulty. Recently he had an MRI in December that did not show osteomyelitis nevertheless he had an abscess that grew MRSA and enterococcus which I elected to treat with Zyvox. This was in December and this wound is actually "healed" over 08/21/16; the patient came in today for his one-month follow-up visit. The area on the lateral aspect of his left foot looks much the same as some month ago. There is no evidence of an open wound here. However the patient tells me about a week after he went back to work he developed severe pain and swelling in the  plantar aspect of his right foot first and fifth metatarsal heads. 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 He went to see his podiatrist at [facility]. She told him that he could not work on his  feet. She told him to go back and his cam walking boot on the left. Not have open wounds obviously on the right. The patient is actually gone ahead and retired from his job because he does not feel he can work on his feet 09/18/16; patient comes back in saying he recently had some pain on the lateral aspect of his left foot. Asked that we look at this. Other than that all of his wounds have a viable surface. He has diabetic shoes. He is retired from work. READMISSION [name] is a 61 year old man who we cared for for a prolonged period of time in late 2017 into March 2018. At that point he had suffered a fracture of his left fifth metatarsal at about the level of the base of the fifth metatarsal. He had surgical repair by Dr. Meryl Dare and he developed a very refractory wound over the fifth metatarsal. Ultimately this became infected he required hardware removal this eventually closed over and we discharged from the clinic in March of this year. He tells me as well until roughly 3 weeks ago when he developed increasing pain in the same area making it difficult for him to sleep. He was admitted to hospital from 9/5 through 9/7 with now an ulcer in the same area on the lateral aspect of his left foot at the level of the base of the metatarsal. X-rays and MRIs during this hospitalization were negative. Wound culture showed a combination of Escherichia coli, Morganella and Pseudomonas. He was given IV Cipro and vancomycin the hospital and then discharged home on Cipro and Doxy. He returned to the ER on 9/16 with leg swelling and discomfort. I don't think anything was added at that point although he did have an ultrasound of the left leg that was negative for DVT. He was back in the ER on 10/4 again with pain in the area he was given Bactrim but states he had a significant allergic reaction to that which has abated once he stopped the Bactrim. I don't think he had a full course. He went to see his  podiatrist yesterday who recommended some form of foot soak. He has a Darco forefoot offloading boot The patient is a type II diabetic on insulin. Tells me his recent hemoglobin A1c was 7. Arterial studies done in July 2017 showed an ABI in the right of 1.3 on the left 1.2 to biphasic waveforms bilaterally. He was not felt to have arterial disease. As noted the patient has had 2 MRIs most recently on 10/4 this showed no evidence of an abscess or osteomyelitis and no change since the prior study of 03/05/17 there was nonspecific edema on the dorsum of the foot. ABIs in this clinic today 1.2 which would be unchanged from his study done in July/17 04/15/17; now down to 1 small wound on the left lateral foot. We wrapped him and applied collagen last week and the foot is fairly macerated. 04/22/17; small wound on the left lateral foot however it has some undermining. Using collagen 04/29/17; small wound on the left lateral foot some surrounding callus. Chronic damage in this area as a result of initial fracture nonhealing and secondary infection.  05/06/17; the patient arrives today with the area on the left lateral foot closed. There is thick subcutaneous tissue with a layer of callus. I took some of the callus off just to ensure adequate closure of the underlying tissue and there is no open wound here. He has considerable deformity of this area of his foot however and unless there is an ability to offload this I think opening is going to be recurrent. He tells me he has diabetic foot wear although I have not actually seen this Readmission: [name], [name] ([id number]) 09/02/17 on evaluation today patient appears to be doing a little better compared to what he tells me what's going on in regard to his left lateral foot. He was previously seen in the ER this past Saturday it is now Tuesday. On Saturday he actually was treated with antibiotics including Cipro 500 mg two times a day and doxycycline 100 mg two  times a day. Subsequently he also did have an x-ray performed of his left foot which revealed increased degenerative changes of the base of the fifth metatarsal since comparison in 2018. There was no radiographic evidence of osteomyelitis however. Patient has previously had an MRI of the left foot which was performed on 04/03/17 in this revealed at that point a soft tissue ulceration but no evidence of osteomyelitis. Patient has previously undergone testing in regard to his ABI's by Dr. Bridgett Larsson and this showed that he did have ABI was within normal limits which does correspond with ABI's we checked here in the office today. That study was on 01/13/16. With all that being said on evaluation today there does not appear to be any evidence of a true open wound of the left lateral fifth metatarsal region. He does have tenderness noted as well is a little bit of boggy/fluctuance feeling to the area in this region of his foot and there is definitely a very solid and firm callous noted laterally. With that being said I am not able to find any obvious opening at this point there does appear to be a spot where there appears to been a opening very recently however patient states he has not noted any drainage from this currently. He does however have discomfort with palpation of this area this is in the 3-4/10 range only with very firm palpation this is not too significant at all. Subsequently he does have a small ulcer at the medial nail bed of the right second toe where he inadvertently pulled off a portion of the toenail softly causing this injury. Otherwise there does not appear to be any evidence of infection in regard to this right second toe. 09/23/17 on evaluation today patient actually appears to be doing well he has no open ulcerations noted at this point. With that being said he did have significant callous noted over the left lateral foot which did require callous pairing today he tolerated this without  complication. Readmission: 11/07/17 patient presents today for readmission concerning to ulcers that he has. One on the right plantar fifth metatarsal region and the other on the left plantar fifth metatarsal region. He has previously had an x-ray which was performed on 11/05/17 and revealed that the patient had wounds noted of the base of the fifth metatarsal region without evidence of destruction to the bone to suggest osteomyelitis. Obviously this is excellent news. With that being said he does have a significantly large ulcer that extensively plantar surface of the wound and is concerning for the fact that he  continues to walk on this due to the patient telling me that he "has to work" I do believe this has likely led to both the opening of the wound as well as the fact that is not really able to heal very well at this point. He has been seen by his podiatrist and apparently according to the note dated 10/23/17 the patient was provided with Santyl and recommended a dry dressing. No wound culture has been obtained at this point. As stated above he has had returned to work due to financial reasons and is not able to wear his postop shoes due to the fact that he cannot wear this and work. The wound is stated to have been present for "several weeks" prior to the visit with the podiatrist on 10/23/17. With that being said again at least the good news is there does not appear to be any evidence of osteomyelitis. 11/14/17 on evaluation today patient appears to be doing poorly in regard to his bilateral foot ulcers. Unfortunately he seems to have significant callous with a lot of maceration underlined especially on the right even more so than the left. He has been tolerating the dressing changes without complication. With that being said overall I do think he's gonna have to have a significant debridement today. 11/21/17 on evaluation today patient presents for follow-up concerning his bilateral lateral foot  ulcers. He has been tolerating the dressing changes without complication. His wife helps perform these for him. With that being said the patient states that the left lateral foot ulcer actually is continuing to give him trouble as far as discomfort is concerned. With that being said actually appears better. 11/28/17 on evaluation today patient appears to be doing poorly in regard to his left lower extremity ulcer in particular. His right as made some progress on the lateral portion although the plantar portion is actually getting somewhat worse I think this is likely due to the fact that he continues to work although it hurts and he's been having a lot of issues out of this side and as far as pain is concerned. With that being said my biggest concern on evaluation today is that of the patient having issues with increased pain in the left lower extremity ulcer site especially on the plantar aspect. He also appears to have what seems to be an abscess developing in the dorsal/lateral portion of his foot which I think likely is communicating somewhere internally with the ulcer site. Nonetheless he has not had any fevers, chills, nausea, or vomiting noted at this time. He has however had fairly significant increase in pain and he was Artie having a lot of pain previous. Readmission: 01-29-2022 upon evaluation today patient presents for initial inspection here in our clinic concerning the current issue which has been present since around Oct 29, 2021. The patient tells me that he has at this point been seen by Dr. Vickki Muff who has been managing this up to this point. He has also been seen by Dr. Lazaro Arms I will outline the findings there shortly as well. With that being said currently according to Dr. Alvera Singh note on 6-20- 2023 the patient has a prescription for Zosyn that was given by infectious disease. Again that was back in June however. There was a hospital encounter for incision and drainage and debridement of  the foot in order to clearway necrotic tissue. The patient also underwent vascular evaluation with Dr. Lucky Cowboy. Nonetheless on the postop visit on 12-18-2021 the patient was seeming  to be doing quite well based on what Dr. Vickki Muff said but nonetheless since that time is really not made much progress and is concerned about losing his foot. He therefore requested a referral to wound care to be seen and evaluated and see if there is anything we can do to help and aid with getting this to heal. Fortunately there does not appear to be any evidence of active infection locally or systemically at this time which is great news. He tells me that he is currently using Betadine wet-to-dry dressings. He does not note any systemic infections but does have evidence of local infection based on what we are seeing. He has had a recent chest x-ray which showed that he had no evidence of acute cardiopulmonary disease that was performed on 12-12-2021. He also underwent vascular intervention with Dr. 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 Subsequently the patient states that Dr. Lucky Cowboy has mentioned that he would need to go back at some time with Dr. Lucky Cowboy said he would consider a posterior tibial approach for revascularization in the future but at this time because of kidney status and the amount of dye used he did not want to proceed any further at that point. Again that something that would likely be scheduled down the road. Nonetheless right now the patient does have an ABI on the left of 0.90 with a TBI of 0.6-0 but this was decreased compared to previous. His ABI on the right was 1.08. Patient did have as well MRI of the left foot which was performed on 12-12-2021 and this shows that he does have interval resection of the proximal third of the fifth metatarsal and area of  osteomyelitis on prior MRI. There is persistent ulceration of plantar lateral aspect at the resection site with a sinus tract extending towards the adjacent cuboid bone findings suspicious for osteomyelitis of the plantar lateral aspect of the cuboid and adjacent base of the fourth metatarsal possibly in the proximal most aspect of the remaining fifth metatarsal shaft as well. The ABI performed on 12-13-2021 showed that the left ABI was 0.83 which had previously been registered at 1.35 and the right ABI was 1.20. I think this is consistent with being able to heal but obviously would do better with vascular intervention not something that is going to be looked into in the future. This was noted above as well. Based on what I am seeing today I do believe that the patient is definitely a candidate for HBO therapy I think this may be in his best interest as I think he is at the point of being in a limb threatening situation. I do believe that he would benefit from hyperbarics along with continued antibiotics along with good wound care measures as well. 02-04-2022 upon evaluation today patient appears to be doing still poorly in regard to his foot he is having a lot of drainage and he notes odor as well. With that being said we still have not heard anything back from insurance yet as far as approval for HBO therapy. Subsequently the patient does have evidence of osteomyelitis and I think hyperbarics is definitely going to be something that would be beneficial for him. He has been on [name], [name]. ([id number]) antibiotics several times previous but unfortunately this just does not seem  to be sustained enough we did do a culture it showed Pseudomonas as one of the primary organisms both organisms would be treated with Levaquin which I think is going to be the best way to go. I discussed that with the patient today and I Georgina Peer send that into the pharmacy. Objective Constitutional Well-nourished and  well-hydrated in no acute distress. Vitals Time Taken: 3:13 PM, Height: 70 in, Weight: 255 lbs, BMI: 36.6, Temperature: 98.2 F, Pulse: 98 bpm, Respiratory Rate: 18 breaths/min, Blood Pressure: 171/73 mmHg. Respiratory normal breathing without difficulty. Psychiatric this patient is able to make decisions and demonstrates good insight into disease process. Alert and Oriented x 3. pleasant and cooperative. General Notes: Upon evaluation today patient's wound still has a lot of drainage at this point. Fortunately I do not see any evidence of systemic infection though locally there is definitely signs of infection from odor to appearance as well. I think that we need to try to get this under better control. Integumentary (Hair, Skin) Wound #14 status is Open. Original cause of wound was Blister. The date acquired was: 10/29/2021. The wound is located on the Left,Lateral Foot. The wound measures 4cm length x 6.5cm width x 2.4cm depth; 20.42cm^2 area and 49.009cm^3 volume. There is bone and Fat Layer (Subcutaneous Tissue) exposed. There is no tunneling or undermining noted. There is a medium amount of sanguinous drainage noted. There is medium (34-66%) red, pink granulation within the wound bed. There is a medium (34-66%) amount of necrotic tissue within the wound bed. Assessment Active Problems ICD-10 Chronic multifocal osteomyelitis, left ankle and foot Type 2 diabetes mellitus with foot ulcer Disruption of external operation (surgical) wound, not elsewhere classified, initial encounter Non-pressure chronic ulcer of other part of left foot with necrosis of bone Plan Follow-up Appointments: Return Appointment in 1 week. Bathing/ Shower/ Hygiene: May shower with wound dressing protected with water repellent cover or cast protector. Edema Control - Lymphedema / Segmental Compressive Device / Other: Elevate, Exercise Daily and Avoid Standing for Long Periods of Time. Elevate legs to the level of  the heart and pump ankles as often as possible Elevate leg(s) parallel to the floor when sitting. Off-Loading: Open toe surgical shoe - left Other: - knee scotter Hyperbaric Oxygen Therapy: Evaluate for HBO Therapy Indication and location: If appropriate for treatment, begin HBOT per protocol: 2.5 ATA for 90 Minutes with 2 Five (5) Minute Air Breaks One treatment per day (delivered Monday through Friday unless otherwise specified in Special Instructions below): COREE, BRAME ([id number]) Total # of Treatments: - 40 Antihistamine 30 minutes prior to HBO Treatment, difficulty clearing ears. Finger stick Blood Glucose Pre- and Post- HBOT Treatment. Follow Hyperbaric Oxygen Glycemia Protocol The following medication(s) was prescribed: levofloxacin oral 750 mg tablet 1 1 tablet oral taken 1 time per day for 30 days. starting 02/04/2022 WOUND #14: - Foot Wound Laterality: Left, Lateral Cleanser: [employer]ram Ancillary Kit - 15 Day Supply (Generic) 3 x Per Week/30 Days Discharge Instructions: Use supplies as instructed; Kit contains: (15) Saline Bullets; (15) 3x3 Gauze; 15 pr Gloves Cleanser: Soap and Water 3 x Per Week/30 Days Discharge Instructions: Gently cleanse wound with antibacterial soap, rinse and pat dry prior to dressing wounds Cleanser: Wound Cleanser 3 x Per Week/30 Days Discharge Instructions: Wash your hands with soap and water. Remove old dressing, discard into plastic bag and place into trash. Cleanse the wound with Wound Cleanser prior to applying a clean dressing using gauze sponges, not tissues or cotton balls. Do  not scrub or use excessive force. Pat dry using gauze sponges, not tissue or cotton balls. Primary Dressing: Silvercel 4 1/4x 4 1/4 (in/in) (Generic) 3 x Per Week/30 Days Discharge Instructions: Apply Silvercel 4 1/4x 4 1/4 (in/in) as instructed Secondary Dressing: ABD Pad 5x9 (in/in) (Generic) 3 x Per Week/30 Days Discharge Instructions: Cover with ABD pad Secured  With: Medipore Tape - 105M Medipore H Soft Cloth Surgical Tape, 2x2 (in/yd) (Generic) 3 x Per Week/30 Days Secured With: [employer] Sterile or Non-Sterile 6-ply 4.5x4 (yd/yd) (Generic) 3 x Per Week/30 Days Discharge Instructions: Apply Kerlix as directed 1. I Georgina Peer go ahead and send in a prescription for the Levaquin for him. The patient voiced understanding and he is in a go pick that up to get it started tonight. 2. I am also can recommend that we have the patient continue with the wound care measures as before which includes the use of the silver alginate dressing followed by an ABD pad at this point. 3. I am also can recommend the patient should continue to clean this with each dressing change and if he has too much drainage he may need to change it more frequently but right now I think 1 time per day is probably going to be ideal based on the amount of drainage that he is experiencing. 4. We are also continuing to have issues with HBO therapy approval right now we have submitted everything and waiting to hear back from insurance the patient is going to see about giving them a call as well I do believe this is going to be interval to try to save his foot and prevent this from getting worse. We will see patient back for reevaluation in 1 week here in the clinic. If anything worsens or changes patient will contact our office for additional recommendations. Electronic Signature(s) Signed: 02/04/2022 3:41:38 PM By: Worthy Keeler PA-C Entered By: Worthy Keeler on 02/04/2022 15:41:37 [name] ([id number]) -------------------------------------------------------------------------------- SuperBill Details Patient Name: [name]. Date of Service: 02/04/2022 Medical Record Number: [id number] Patient Account Number: [id number] Date of Birth/Sex: 1960-12-12 (61 y.o. M) Treating RN: Carlene Coria Primary Care Provider: [name] Other Clinician: Referring Provider: [name] Treating Provider/Extender: Skipper Cliche in Treatment: 0 Diagnosis Coding ICD-10 Codes Code Description [phone] Chronic multifocal osteomyelitis, left ankle and foot E11.621 Type 2 diabetes mellitus with foot ulcer T81.31XA Disruption of external operation (surgical) wound, not elsewhere classified, initial encounter L97.524 Non-pressure chronic ulcer of other part of left foot with necrosis of bone Facility Procedures CPT4 Code: [id number] Description: 99213 - WOUND CARE VISIT-LEV 3 EST PT Modifier: Quantity: 1 Physician Procedures CPT4 Code: [id number] Description: 01027 - WC PHYS LEVEL 4 - EST PT Modifier: Quantity: 1 CPT4 Code: Description: ICD-10 Diagnosis Description M86.372 Chronic multifocal osteomyelitis, left ankle and foot E11.621 Type 2 diabetes mellitus with foot ulcer T81.31XA Disruption of external operation (surgical) wound, not elsewhere classifi L97.524  Non-pressure chronic ulcer of other part of left foot with necrosis of bo Modifier: ed, initial encounter ne Quantity: Electronic Signature(s) Signed: 02/04/2022 3:41:48 PM By: Worthy Keeler PA-C Entered By: Worthy Keeler on 02/04/2022 15:41:48

## 2022-02-04 NOTE — Progress Notes (Addendum)
GENEVA, PALLAS ([id number]) Visit Report for 01/29/2022 Chief Complaint Document Details Patient Name: [name], [name]. Date of Service: 01/29/2022 8:45 AM Medical Record Number: [id number] Patient Account Number: [id number] Date of Birth/Sex: 09-07-1960 (61 y.o. M) Treating RN: Carlene Coria Primary Care Provider: [name] Other Clinician: Referring Provider: [name] Treating Provider/Extender: Skipper Cliche in Treatment: 0 Information Obtained from: Patient Chief Complaint Left lateral foot ulcer Electronic Signature(s) Signed: 01/31/2022 9:11:36 AM By: Worthy Keeler PA-C Entered By: Worthy Keeler on 01/31/2022 09:11:35 [name] ([id number]) -------------------------------------------------------------------------------- Debridement Details Patient Name: [name]. Date of Service: 01/29/2022 8:45 AM Medical Record Number: [id number] Patient Account Number: [id number] Date of Birth/Sex: [birth year] (61 y.o. M) Treating RN: Carlene Coria Primary Care Provider: [name] Other Clinician: Referring Provider: [name] Treating Provider/Extender: Skipper Cliche in Treatment: 0 Debridement Performed for Wound #14 Left,Lateral Foot Assessment: Performed By: Physician Tommie Sams., PA-C Debridement Type: Debridement Severity of Tissue Pre Debridement: Fat layer exposed Level of Consciousness (Pre- Awake and Alert procedure): Pre-procedure Verification/Time Out Yes - 09:40 Taken: Start Time: 09:40 Total Area Debrided (L x W): 2.5 (cm) x 11 (cm) = 27.5 (cm) Tissue and other material Viable, Non-Viable, Callus, Eschar, Subcutaneous, Skin: Dermis , Skin: Epidermis debrided: Level: Skin/Subcutaneous Tissue Debridement Description: Excisional Instrument: Forceps, Scissors Specimen: Swab, Number of Specimens Taken: 1 Bleeding: Moderate Hemostasis Achieved: Pressure End Time: 09:52 Procedural Pain: 0 Post Procedural Pain: 0 Response to  Treatment: Procedure was tolerated well Level of Consciousness (Post- Awake and Alert procedure): Post Debridement Measurements of Total Wound Length: (cm) 5 Width: (cm) 6.5 Depth: (cm) 2.5 Volume: (cm) 63.814 Character of Wound/Ulcer Post Debridement: Improved Severity of Tissue Post Debridement: Fat layer exposed Post Procedure Diagnosis Same as Pre-procedure Electronic Signature(s) Signed: 01/31/2022 10:59:11 AM By: Worthy Keeler PA-C Signed: 02/04/2022 8:34:39 AM By: Carlene Coria RN Previous Signature: 01/29/2022 4:03:12 PM Version By: Carlene Coria RN Entered By: Worthy Keeler on 01/31/2022 10:59:10 [name] ([id number]) -------------------------------------------------------------------------------- HPI Details Patient Name: [name]. Date of Service: 01/29/2022 8:45 AM Medical Record Number: [id number] Patient Account Number: [id number] Date of Birth/Sex: 28-Nov-1960 (61 y.o. M) Treating RN: Carlene Coria Primary Care Provider: [name] Other Clinician: Referring Provider: [name] Treating Provider/Extender: Skipper Cliche in Treatment: 0 History of Present Illness HPI Description: Pleasant 61 year old with history of diabetes (Hgb A1c 10.8 in 2014) and peripheral neuropathy. No PVD. L ABI 1.1. Status post right great toe partial amputation years ago. He was at work and on 10/22/2014, was injured by a cart, and suffered an ulceration to his left anterior calf. He says that it subsequently became infected, and he was treated with a course of antibiotics. He was found on initial exam to have an ulceration on the dorsum of his left third toe. He was unaware of this and attributes it to pressure from his steel toed boots. More recently he injured his right anterior calf on a cart. Ambulating normally per his baseline. He has been undergoing regular debridements, applying mupirocin cream, and an Ace wrap for edema control. He returns to clinic for  follow-up and is without complaints. No pain. No fever or chills. No drainage. 10/25/15; this is a 61 year old man who has type II diabetes with diabetic polyneuropathy. He tells me that he fractured his left fifth metatarsal in June 2016 when he presented with swelling. He does not recall a specific injury. His hemoglobin A1c was apparently too high at the  time for consideration of surgery and he was put in some form of offloading. Ultimately he went to surgery in December with an allograft from his calcaneus to this site, plate and screws. He had an x-ray of the foot in March that showed concerns about nonunion. He tells me that in March he had to move and basically moved himself. He was on his foot a lot and then noticed some drainage from an open area. He has been following with his orthopedic surgeon Dr. Doran Durand. He has been applying a felt donut, dry dressing and using his heel healing sandal. 11/01/15; this is a patient I saw last week for the first time. He had a small open wound on the plantar aspect of his left foot at roughly the level of the base of his fifth metatarsal. He had a considerable degree of thickened skin around this wound on the plantar aspect which I thought was from chronic pressure on this area. He tells [place] that he had drainage over the course of the week. No systemic symptoms. 11/08/15; culture last week grew Citrobacter korseri. This should've been sensitive to the Augmentin I gave him. He has seen Dr. Doran Durand who did his initial surgery and according the patient the plan is to give this another month and then the hardware might need to come out of this. This seems like a reasonable plan. I will adjust his antibiotics to ciprofloxacin which probably should continue for at least another 2 weeks. I gave him 10 days worth today 11/22/15 the patient has completed antibiotics. He has an appointment with Dr. Doran Durand this Friday. There is improved dimensions around the wound on the left  fifth metatarsal base 11/29/15; the patient has completed antibiotics last week. Apparently his appointment with Dr. Doran Durand it is not until this Friday. Dimensions are roughly the same. 12/06/15; saw Dr. Doran Durand. No x-ray told the end of the month, next appointment June 30. We have been using Aquacel Ag 12/13/15: No major change this week. Using Aquacel AG 621/17; arrived this week with maceration around the wound. There was quite a bit of undermining which required surgical debridement. I changed him to [facility] last week, by the patient's admission he was up on this more this week 12/27/15; macerated tissue around the wound is removed with a scalpel and pickups. There is no undermining. Nonviable subcutaneous tissue and skin taken from the superior circumference of the wound is slough from the surface. READMISSION 03/06/16 since I last saw this patient at the end of June, he went for surgery on 01/11/16 by Dr. Doran Durand of orthopedics. He had a left foot irrigation and debridement, removal of hardware and placement of wound VAC. He is also been followed by Dr. Megan Salon of infectious disease and completed a six-week course of IV Rocephin for group B strep and Enterobacter in the bone at the time of surgery. Apparently at the time of surgery the bone looked healthy so I don't think any bone was actually removed. He has been using silver alginate based dressings on the same wound area at the base of the left fifth metatarsal on the left. I note that he is also had arterial studies on 01/08/16, these showed a left ABI of 1.2 to and a right ABI of 1.3. Waveforms were listed as biphasic. He was not felt to have any specific arterial issues. 03/13/16; no real change in the condition of the wound at the left lateral foot at roughly the base of his fifth metatarsal.  Use silver alginate last week. 03/18/16 arrives today with no open area. Being suspicious of the overlying callus I. Some of this back although I see  nothing but covering tissue here/epithelium. There is no surrounding tenderness READMISSION 04/16/16 this is a patient I discharged about a month ago. Initially a surgical wound on the lateral aspect of his left foot which subsequently became infected. The story he is giving today that he went back into his own modified shoe started to notice pain 2 weeks ago he was seen in Dr. Nona Dell office by a physician assistant last Wednesday and according the patient was told that everything looked fine however this is clearly now broken open and he has an open wound in the same spot that we have been dealing with repetitively. Situation is complicated by the fact that he is running short of money on long-term disability. He has not taken his insulin and at least 2 weeks was previously on NovoLog short acting insulin on a sliding scale and TRESIBA 35 units at bedtime. He is no longer able to afford any of his medication he was in the x-ray on 10/15. A plain x-ray showed healed fracture of the left fifth metatarsal bone status post removal of the associated plate and screw fixation hardware. There was no acute appearing osseous abnormality. His blood work showed a white count of 9.2 with an essentially normal differential comprehensive metabolic panel was normal. Previous CT scan of the foot on 01/08/16 showed no osteomyelitis previous vascular workup showed no evidence of significant PAD on 01/13/16 04/23/16; culture I did last week grew enterococcus [ampicillin sensitive] and MRSA. He saw infectious disease yesterday. They stop the clindamycin and ordered an MRI. This is not unreasonable. All the hardware is out of the foot at this point. 04/30/16 at this point in time we are still awaiting the results of the MRI at this point in time. Patient did have an area which appears to be somewhat macerated in the proximal portion of the wound where there is overlying necrotic skin that is doing nothing more than  trapping fluid [name], [name]. ([id number]) underneath. He continues to state that he does have some discomfort and again the concern is for the possibility of osteomyelitis hence the reason for the MRI order. We have been using a silver alginate dressing but again I think the reason this with his macerated as it was is that the dressing obscene could not reach the entirety of the wound due to some necrotic skin covering the proximal portion. No bleeding noted at this point in time on initial evaluation. 05/07/16 we receive the results of patient's MRI which shows that he has no evidence of osteomyelitis. This obviously is excellent news. Patient was definitely happy to hear this. He tells me the wound appears to be doing very well at this point in time and is pleased with the progress currently. 05/15/16; as noted the patient did not have osteomyelitis. He has been released by infectious disease and orthopedics. His wound is still open he had a debridement last week but complain when he got home he "bleeding for half a day". He is not had any pain. We have been using silver alginate with Kerlix gauze wrap. 05/28/16; patient arrives today with the wound in much the same condition as last time. He has a small opening on the lateral aspect of his foot however with debridement there is clear undermining medially there is no real evidence of infection here and I  didn't see any point in culturing this. One would have to wonder if this isn't a simple matter of in adequate offloading as he is been using a healing sandal. 06/05/16; the open area is now on the plantar aspect of his foot and not decide. The wound almost appears to have "migrated". This was the term use by our intake nurse. 06/12/16; open area on the plantar aspect of his foot. Base of the wound looks very healthy. This will be his second week in a total contact cast 06/19/16; patient arrived today in a total contact cast. There was some  expectation from our staff and myself that this area would be healed. Unfortunately the area was boggy and with rec pressure a fairly substantial amount of purulent drainage was obtained. Specimen obtained for culture. The patient had no complaints of systemic problems including fever or chills or instability of his diabetes. There was no pain in the foot. Nevertheless a extensive debridement was required. 06/26/16; patient's culture from the abscess last week grew a combination of MRSA and ampicillin sensitive enterococcus. I had him on Augmentin and Septra however I have elected to give him a full 10 day course of Zyvox instead as I Recently treated this combination of organisms with Augmentin and Septra before. Arrives today with no systemic symptoms 07/03/16; the patient has 2 more treatments of Zyvox and then he is finished antibiotics the wound has improved now mostly on the lateral aspect of his foot. There is still some tenderness when he walks. 07/10/16; patient arrives today with Zyvox completed. He only has a small open area remaining. 07/24/16; she arrives here with no overt open area. The covering is thick callus/eschar. Nevertheless there is no open area here. He has some tenderness underneath the area but no overt infection is observed no drainage. The patient has a deformity in the foot with this area weekly to be exposed to more pressure in his foot where nevertheless that something we are going to have to deal with going forward. The patient has diabetic workboots and diabetic shoes. He has had a long difficult course with the area here. This started as a fracture at work. He had bone grafting from his calcaneus and screws. This got infected he had to have more surgery on the area. Bone at the time of that surgery I think showed enterococcus and group B strep. He had 6 weeks of Rocephin. Since then the area has waxed and waned in its difficulty. Recently he had an MRI in December that  did not show osteomyelitis nevertheless he had an abscess that grew MRSA and enterococcus which I elected to treat with Zyvox. This was in December and this wound is actually "healed" over 08/21/16; the patient came in today for his one-month follow-up visit. The area on the lateral aspect of his left foot looks much the same as some month ago. There is no evidence of an open wound here. However the patient tells me about a week after he went back to work he developed severe pain and swelling in the plantar aspect of his right foot first and fifth metatarsal heads. 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 He went to see his podiatrist at [facility]. She told him that he could not work on his feet. She told him to go back and his cam walking boot on the left. Not have open wounds obviously  on the right. The patient is actually gone ahead and retired from his job because he does not feel he can work on his feet 09/18/16; patient comes back in saying he recently had some pain on the lateral aspect of his left foot. Asked that we look at this. Other than that all of his wounds have a viable surface. He has diabetic shoes. He is retired from work. READMISSION [name] is a 61 year old man who we cared for for a prolonged period of time in late 2017 into March 2018. At that point he had suffered a fracture of his left fifth metatarsal at about the level of the base of the fifth metatarsal. He had surgical repair by Dr. Meryl Dare and he developed a very refractory wound over the fifth metatarsal. Ultimately this became infected he required hardware removal this eventually closed over and we discharged from the clinic in March of this year. He tells me as well until roughly 3 weeks ago when he developed increasing pain in the same area making it difficult for him to sleep. He was admitted to hospital from 9/5 through 9/7  with now an ulcer in the same area on the lateral aspect of his left foot at the level of the base of the metatarsal. X-rays and MRIs during this hospitalization were negative. Wound culture showed a combination of Escherichia coli, Morganella and Pseudomonas. He was given IV Cipro and vancomycin the hospital and then discharged home on Cipro and Doxy. He returned to the ER on 9/16 with leg swelling and discomfort. I don't think anything was added at that point although he did have an ultrasound of the left leg that was negative for DVT. He was back in the ER on 10/4 again with pain in the area he was given Bactrim but states he had a significant allergic reaction to that which has abated once he stopped the Bactrim. I don't think he had a full course. He went to see his podiatrist yesterday who recommended some form of foot soak. He has a Darco forefoot offloading boot The patient is a type II diabetic on insulin. Tells me his recent hemoglobin A1c was 7. Arterial studies done in July 2017 showed an ABI in the right of 1.3 on the left 1.2 to biphasic waveforms bilaterally. He was not felt to have arterial disease. As noted the patient has had 2 MRIs most recently on 10/4 this showed no evidence of an abscess or osteomyelitis and no change since the prior study of 03/05/17 there was nonspecific edema on the dorsum of the foot. ABIs in this clinic today 1.2 which would be unchanged from his study done in July/17 04/15/17; now down to 1 small wound on the left lateral foot. We wrapped him and applied collagen last week and the foot is fairly macerated. 04/22/17; small wound on the left lateral foot however it has some undermining. Using collagen 04/29/17; small wound on the left lateral foot some surrounding callus. Chronic damage in this area as a result of initial fracture nonhealing and secondary infection. 05/06/17; the patient arrives today with the area on the left lateral foot closed. There is  thick subcutaneous tissue with a layer of callus. I took some of the callus off just to ensure adequate closure of the underlying tissue and there is no open wound here. He has considerable deformity of this area of his foot however and unless there is an ability to offload this I think opening is going to  be recurrent. He tells me he has diabetic foot wear although I have not actually seen this Readmission: 09/02/17 on evaluation today patient appears to be doing a little better compared to what he tells me what's going on in regard to his left lateral foot. He was previously seen in the ER this past Saturday it is now Tuesday. On Saturday he actually was treated with antibiotics including Cipro 500 mg two times a day and doxycycline 100 mg two times a day. Subsequently he also did have an x-ray performed of his left foot which revealed increased degenerative changes of the base of the fifth metatarsal since comparison in 2018. There was no radiographic evidence of osteomyelitis however. Patient has previously had an MRI of the left foot which was performed on 04/03/17 in this revealed at that point a soft [name], [name]. ([id number]) tissue ulceration but no evidence of osteomyelitis. Patient has previously undergone testing in regard to his ABI's by Dr. Bridgett Larsson and this showed that he did have ABI was within normal limits which does correspond with ABI's we checked here in the office today. That study was on 01/13/16. With all that being said on evaluation today there does not appear to be any evidence of a true open wound of the left lateral fifth metatarsal region. He does have tenderness noted as well is a little bit of boggy/fluctuance feeling to the area in this region of his foot and there is definitely a very solid and firm callous noted laterally. With that being said I am not able to find any obvious opening at this point there does appear to be a spot where there appears to been a opening very  recently however patient states he has not noted any drainage from this currently. He does however have discomfort with palpation of this area this is in the 3-4/10 range only with very firm palpation this is not too significant at all. Subsequently he does have a small ulcer at the medial nail bed of the right second toe where he inadvertently pulled off a portion of the toenail softly causing this injury. Otherwise there does not appear to be any evidence of infection in regard to this right second toe. 09/23/17 on evaluation today patient actually appears to be doing well he has no open ulcerations noted at this point. With that being said he did have significant callous noted over the left lateral foot which did require callous pairing today he tolerated this without complication. Readmission: 11/07/17 patient presents today for readmission concerning to ulcers that he has. One on the right plantar fifth metatarsal region and the other on the left plantar fifth metatarsal region. He has previously had an x-ray which was performed on 11/05/17 and revealed that the patient had wounds noted of the base of the fifth metatarsal region without evidence of destruction to the bone to suggest osteomyelitis. Obviously this is excellent news. With that being said he does have a significantly large ulcer that extensively plantar surface of the wound and is concerning for the fact that he continues to walk on this due to the patient telling me that he "has to work" I do believe this has likely led to both the opening of the wound as well as the fact that is not really able to heal very well at this point. He has been seen by his podiatrist and apparently according to the note dated 10/23/17 the patient was provided with Santyl and recommended a dry dressing. No  wound culture has been obtained at this point. As stated above he has had returned to work due to financial reasons and is not able to wear his postop shoes  due to the fact that he cannot wear this and work. The wound is stated to have been present for "several weeks" prior to the visit with the podiatrist on 10/23/17. With that being said again at least the good news is there does not appear to be any evidence of osteomyelitis. 11/14/17 on evaluation today patient appears to be doing poorly in regard to his bilateral foot ulcers. Unfortunately he seems to have significant callous with a lot of maceration underlined especially on the right even more so than the left. He has been tolerating the dressing changes without complication. With that being said overall I do think he's gonna have to have a significant debridement today. 11/21/17 on evaluation today patient presents for follow-up concerning his bilateral lateral foot ulcers. He has been tolerating the dressing changes without complication. His wife helps perform these for him. With that being said the patient states that the left lateral foot ulcer actually is continuing to give him trouble as far as discomfort is concerned. With that being said actually appears better. 11/28/17 on evaluation today patient appears to be doing poorly in regard to his left lower extremity ulcer in particular. His right as made some progress on the lateral portion although the plantar portion is actually getting somewhat worse I think this is likely due to the fact that he continues to work although it hurts and he's been having a lot of issues out of this side and as far as pain is concerned. With that being said my biggest concern on evaluation today is that of the patient having issues with increased pain in the left lower extremity ulcer site especially on the plantar aspect. He also appears to have what seems to be an abscess developing in the dorsal/lateral portion of his foot which I think likely is communicating somewhere internally with the ulcer site. Nonetheless he has not had any fevers, chills, nausea, or  vomiting noted at this time. He has however had fairly significant increase in pain and he was Artie having a lot of pain previous. Readmission: 01-29-2022 upon evaluation today patient presents for initial inspection here in our clinic concerning the current issue which has been present since around Oct 29, 2021. The patient tells me that he has at this point been seen by Dr. Vickki Muff who has been managing this up to this point. He has also been seen by Dr. Lazaro Arms I will outline the findings there shortly as well. With that being said currently according to Dr. Alvera Singh note on 6-20- 2023 the patient has a prescription for Zosyn that was given by infectious disease. Again that was back in June however. There was a hospital encounter for incision and drainage and debridement of the foot in order to clearway necrotic tissue. The patient also underwent vascular evaluation with Dr. Lucky Cowboy. Nonetheless on the postop visit on 12-18-2021 the patient was seeming to be doing quite well based on what Dr. Vickki Muff said but nonetheless since that time is really not made much progress and is concerned about losing his foot. He therefore requested a referral to wound care to be seen and evaluated and see if there is anything we can do to help and aid with getting this to heal. Fortunately there does not appear to be any evidence of active infection locally or  systemically at this time which is great news. He tells me that he is currently using Betadine wet-to-dry dressings. He does not note any systemic infections but does have evidence of local infection based on what we are seeing. He has had a recent chest x-ray which showed that he had no evidence of acute cardiopulmonary disease that was performed on 12-12-2021. He also underwent vascular intervention with Dr. 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 Subsequently the patient states that Dr. Lucky Cowboy has mentioned that he would need to go back at some time with Dr. Lucky Cowboy said he would consider a posterior tibial approach for revascularization in the future but at this time because of kidney status and the amount of dye used he did not want to proceed any further at that point. Again that something that would likely be scheduled down the road. Nonetheless right now the patient does have an ABI on the left of 0.90 with a TBI of 0.6-0 but this was decreased compared to previous. His ABI on the right was 1.08. Patient did have as well MRI of the left foot which was performed on 12-12-2021 and this shows that he does have interval resection of the proximal third of the fifth metatarsal and area of osteomyelitis on prior MRI. There is persistent ulceration of plantar lateral aspect at the resection site with a sinus tract extending towards the adjacent cuboid bone findings suspicious for osteomyelitis of the plantar lateral aspect of the cuboid and adjacent base of the fourth metatarsal possibly in the proximal most aspect of the remaining fifth metatarsal shaft as well. The ABI performed on 12-13-2021 showed that the left ABI was 0.83 which had previously been registered at 1.35 and the right ABI was 1.20. I think this is consistent with being able to heal but obviously would do better with vascular intervention not something that is going to be looked into in the future. This was noted above as well. Based on what I am seeing today I do believe that the patient is definitely a candidate for HBO therapy I think this may be in his best interest as I think he is at the point of being in a limb threatening situation. I do believe that he would benefit from hyperbarics along with continued antibiotics along with good wound care measures as well. Electronic Signature(s) Signed: 01/31/2022 10:57:39 AM By: Worthy Keeler PA-C Entered  By: Worthy Keeler on 01/31/2022 10:57:39 [name] ([id number]) -------------------------------------------------------------------------------- Physical Exam Details Patient Name: CARMON, SAHLI. Date of Service: 01/29/2022 8:45 AM Medical Record Number: [id number] Patient Account Number: [id number] Date of Birth/Sex: [birth year] (61 y.o. M) Treating RN: Carlene Coria Primary Care Provider: [name] Other Clinician: Referring Provider: [name] Treating Provider/Extender: Skipper Cliche in Treatment: 0 Constitutional patient is hypertensive.. pulse regular and within target range for patient.Marland Kitchen respirations regular, non-labored and within target range for patient.Marland Kitchen temperature within target range for patient.. Well-nourished and well-hydrated in no acute distress. Eyes conjunctiva clear no eyelid edema noted. pupils equal round and reactive to light and accommodation. Ears, Nose, Mouth, and Throat no gross abnormality of ear auricles or external auditory canals. normal hearing noted during conversation. mucus membranes moist. Respiratory normal breathing without difficulty. Cardiovascular 2+ dorsalis pedis/posterior tibialis pulses. no clubbing, cyanosis, significant edema, <3  sec cap refill. Musculoskeletal normal gait and posture. no significant deformity or arthritic changes, no loss or range of motion, no clubbing. Psychiatric this patient is able to make decisions and demonstrates good insight into disease process. Alert and Oriented x 3. pleasant and cooperative. Notes Upon inspection patient's wound is actually significant with a fairly deep ulceration. At this point I do not see direct bone exposure which is good news. With that being said there was an 8 here today for some sharp debridement of clearway some of the necrotic debris and the patient tolerated that today without complication. This was included a lot of callus along with slough and dead tissue as  well as biofilm down to good subcutaneous tissue. Postdebridement and looks better but there is still a ways to go to try to get this thing to heal. Electronic Signature(s) Signed: 01/31/2022 10:59:21 AM By: Worthy Keeler PA-C Entered By: Worthy Keeler on 01/31/2022 10:59:21 [name] ([id number]) -------------------------------------------------------------------------------- Physician Orders Details Patient Name: [name]. Date of Service: 01/29/2022 8:45 AM Medical Record Number: [id number] Patient Account Number: [id number] Date of Birth/Sex: [birth year] (61 y.o. M) Treating RN: Carlene Coria Primary Care Provider: [name] Other Clinician: Referring Provider: [name] Treating Provider/Extender: Skipper Cliche in Treatment: 0 Verbal / Phone Orders: No Diagnosis Coding ICD-10 Coding Code Description [phone] Chronic multifocal osteomyelitis, left ankle and foot E11.621 Type 2 diabetes mellitus with foot ulcer T81.31XA Disruption of external operation (surgical) wound, not elsewhere classified, initial encounter L97.524 Non-pressure chronic ulcer of other part of left foot with necrosis of bone Follow-up Appointments o Return Appointment in 1 week. Bathing/ Shower/ Hygiene o May shower with wound dressing protected with water repellent cover or cast protector. Edema Control - Lymphedema / Segmental Compressive Device / Other o Elevate, Exercise Daily and Avoid Standing for Long Periods of Time. o Elevate legs to the level of the heart and pump ankles as often as possible o Elevate leg(s) parallel to the floor when sitting. Off-Loading o Open toe surgical shoe - left o Other: - knee scotter Hyperbaric Oxygen Therapy o Evaluate for HBO Therapy o Indication and location: o If appropriate for treatment, begin HBOT per protocol: o 2.5 ATA for 90 Minutes with 2 Five (5) Minute Air Breaks o One treatment per day (delivered Monday  through Friday unless otherwise specified in Special Instructions below): o Total # of Treatments: - 40 o Antihistamine 30 minutes prior to HBO Treatment, difficulty clearing ears. o Finger stick Blood Glucose Pre- and Post- HBOT Treatment. o Follow Hyperbaric Oxygen Glycemia Protocol Wound Treatment Wound #14 - Foot Wound Laterality: Left, Lateral Cleanser: Byram Ancillary Kit - 15 Day Supply (Generic) 3 x Per Week/30 Days Discharge Instructions: Use supplies as instructed; Kit contains: (15) Saline Bullets; (15) 3x3 Gauze; 15 pr Gloves Cleanser: Soap and Water 3 x Per Week/30 Days Discharge Instructions: Gently cleanse wound with antibacterial soap, rinse and pat dry prior to dressing wounds Cleanser: Wound Cleanser 3 x Per Week/30 Days Discharge Instructions: Wash your hands with soap and water. Remove old dressing, discard into plastic bag and place into trash. Cleanse the wound with Wound Cleanser prior to applying a clean dressing using gauze sponges, not tissues or cotton balls. Do not scrub or use excessive force. Pat dry using gauze sponges, not tissue or cotton balls. Primary Dressing: Silvercel 4 1/4x 4 1/4 (in/in) (Generic) 3 x Per Week/30 Days Discharge Instructions: Apply Silvercel 4 1/4x 4 1/4 (in/in) as instructed Secondary  Dressing: ABD Pad 5x9 (in/in) (Generic) 3 x Per Week/30 Days Discharge Instructions: Cover with ABD pad Secured With: Medipore Tape - 83M Medipore H Soft Cloth Surgical Tape, 2x2 (in/yd) (Generic) 3 x Per Week/30 Days Secured With: [employer] Sterile or Non-Sterile 6-ply 4.5x4 (yd/yd) (Generic) 3 x Per Week/30 Days [name], [name] ([id number]) Discharge Instructions: Apply Kerlix as directed Laboratory o Bacteria identified in Wound by Culture (MICRO) - non healing wound - (ICD10 M86.372 - Chronic multifocal osteomyelitis, left ankle and foot) oooo LOINC Code: 6462-6 oooo Convenience Name: Wound culture routine GLYCEMIA INTERVENTIONS  PROTOCOL PRE-HBO GLYCEMIA INTERVENTIONS ACTION INTERVENTION Obtain pre-HBO capillary blood glucose (ensure 1 physician order is in chart). A. Notify HBO physician and await physician orders. 2 If result is 70 mg/dl or below: B. If the result meets the hospital definition of a critical result, follow hospital policy. A. Give patient an 8 ounce Glucerna Shake, an 8 ounce Ensure, or 8 ounces of a Glucerna/Ensure equivalent dietary supplement*. B. Wait 30 minutes. If result is 71 mg/dl to 130 mg/dl: C. Retest patientos capillary blood glucose (CBG). D. If result greater than or equal to 110 mg/dl, proceed with HBO. If result less than 110 mg/dl, notify HBO physician and consider holding HBO. If result is 131 mg/dl to 249 mg/dl: A. Proceed with HBO. A. Notify HBO physician and await physician orders. B. It is recommended to hold HBO and do If result is 250 mg/dl or greater: blood/urine ketone testing. C. If the result meets the hospital definition of a critical result, follow hospital policy. POST-HBO GLYCEMIA INTERVENTIONS ACTION INTERVENTION Obtain post HBO capillary blood glucose (ensure 1 physician order is in chart). A. Notify HBO physician and await physician orders. 2 If result is 70 mg/dl or below: B. If the result meets the hospital definition of a critical result, follow hospital policy. A. Give patient an 8 ounce Glucerna Shake, an 8 ounce Ensure, or 8 ounces of a Glucerna/Ensure equivalent dietary supplement*. B. Wait 15 minutes for symptoms of hypoglycemia (i.e. nervousness, anxiety, If result is 71 mg/dl to 100 mg/dl: sweating, chills, clamminess, irritability, confusion, tachycardia or dizziness). C. If patient asymptomatic, discharge patient. If patient symptomatic, repeat capillary blood glucose (CBG) and notify HBO physician. If result is 101 mg/dl to 249 mg/dl: A. Discharge patient. A. Notify HBO physician and await physician orders. B. It  is recommended to do blood/urine If result is 250 mg/dl or greater: ketone testing. C. If the result meets the hospital definition of a critical result, follow hospital policy. *Juice or candies are NOT equivalent products. If patient refuses the Glucerna or Ensure, please consult the hospital dietitian for an appropriate substitute. Electronic Signature(s) Signed: 02/28/2022 5:40:47 PM By: Worthy Keeler PA-C Signed: 03/18/2022 2:32:45 PM By: Carlene Coria RN Previous Signature: 01/29/2022 4:03:12 PM Version By: Carlene Coria RN [name], [name] ([id number]) Previous Signature: 01/31/2022 6:41:29 PM Version By: Worthy Keeler PA-C Entered By: Carlene Coria on 02/07/2022 13:19:15 [name], [name] ([id number]) -------------------------------------------------------------------------------- Problem List Details Patient Name: WAYBURN, SHALER. Date of Service: 01/29/2022 8:45 AM Medical Record Number: [id number] Patient Account Number: [id number] Date of Birth/Sex: [birth year] (61 y.o. M) Treating RN: Carlene Coria Primary Care Provider: [name] Other Clinician: Referring Provider: [name] Treating Provider/Extender: Skipper Cliche in Treatment: 0 Active Problems ICD-10 Encounter Code Description Active Date MDM Diagnosis [phone] Chronic multifocal osteomyelitis, left ankle and foot 01/29/2022 No Yes E11.621 Type 2 diabetes mellitus with foot ulcer 01/29/2022 No Yes T81.31XA Disruption  of external operation (surgical) wound, not elsewhere 01/29/2022 No Yes classified, initial encounter L97.524 Non-pressure chronic ulcer of other part of left foot with necrosis of 01/29/2022 No Yes bone Inactive Problems Resolved Problems Electronic Signature(s) Signed: 01/29/2022 10:04:30 AM By: Worthy Keeler PA-C Entered By: Worthy Keeler on 01/29/2022 10:04:30 [name] ([id number]) -------------------------------------------------------------------------------- Progress Note/History and  Physical Details Patient Name: [name]. Date of Service: 01/29/2022 8:45 AM Medical Record Number: [id number] Patient Account Number: [id number] Date of Birth/Sex: 1961/01/28 (61 y.o. M) Treating RN: Carlene Coria Primary Care Provider: [name] Other Clinician: Referring Provider: [name] Treating Provider/Extender: Skipper Cliche in Treatment: 0 Subjective Chief Complaint Information obtained from Patient Left lateral foot ulcer History of Present Illness (HPI) Pleasant 61 year old with history of diabetes (Hgb A1c 10.8 in 2014) and peripheral neuropathy. No PVD. L ABI 1.1. Status post right great toe partial amputation years ago. He was at work and on 10/22/2014, was injured by a cart, and suffered an ulceration to his left anterior calf. He says that it subsequently became infected, and he was treated with a course of antibiotics. He was found on initial exam to have an ulceration on the dorsum of his left third toe. He was unaware of this and attributes it to pressure from his steel toed boots. More recently he injured his right anterior calf on a cart. Ambulating normally per his baseline. He has been undergoing regular debridements, applying mupirocin cream, and an Ace wrap for edema control. He returns to clinic for follow-up and is without complaints. No pain. No fever or chills. No drainage. 10/25/15; this is a 61 year old man who has type II diabetes with diabetic polyneuropathy. He tells me that he fractured his left fifth metatarsal in June 2016 when he presented with swelling. He does not recall a specific injury. His hemoglobin A1c was apparently too high at the time for consideration of surgery and he was put in some form of offloading. Ultimately he went to surgery in December with an allograft from his calcaneus to this site, plate and screws. He had an x-ray of the foot in March that showed concerns about nonunion. He tells me that in March he had to  move and basically moved himself. He was on his foot a lot and then noticed some drainage from an open area. He has been following with his orthopedic surgeon Dr. Doran Durand. He has been applying a felt donut, dry dressing and using his heel healing sandal. 11/01/15; this is a patient I saw last week for the first time. He had a small open wound on the plantar aspect of his left foot at roughly the level of the base of his fifth metatarsal. He had a considerable degree of thickened skin around this wound on the plantar aspect which I thought was from chronic pressure on this area. He tells [place] that he had drainage over the course of the week. No systemic symptoms. 11/08/15; culture last week grew Citrobacter korseri. This should've been sensitive to the Augmentin I gave him. He has seen Dr. Doran Durand who did his initial surgery and according the patient the plan is to give this another month and then the hardware might need to come out of this. This seems like a reasonable plan. I will adjust his antibiotics to ciprofloxacin which probably should continue for at least another 2 weeks. I gave him 10 days worth today 11/22/15 the patient has completed antibiotics. He has an appointment with Dr. Doran Durand  this Friday. There is improved dimensions around the wound on the left fifth metatarsal base 11/29/15; the patient has completed antibiotics last week. Apparently his appointment with Dr. Doran Durand it is not until this Friday. Dimensions are roughly the same. 12/06/15; saw Dr. Doran Durand. No x-ray told the end of the month, next appointment June 30. We have been using Aquacel Ag 12/13/15: No major change this week. Using Aquacel AG 621/17; arrived this week with maceration around the wound. There was quite a bit of undermining which required surgical debridement. I changed him to [facility] last week, by the patient's admission he was up on this more this week 12/27/15; macerated tissue around the wound is removed with a  scalpel and pickups. There is no undermining. Nonviable subcutaneous tissue and skin taken from the superior circumference of the wound is slough from the surface. READMISSION 03/06/16 since I last saw this patient at the end of June, he went for surgery on 01/11/16 by Dr. Doran Durand of orthopedics. He had a left foot irrigation and debridement, removal of hardware and placement of wound VAC. He is also been followed by Dr. Megan Salon of infectious disease and completed a six-week course of IV Rocephin for group B strep and Enterobacter in the bone at the time of surgery. Apparently at the time of surgery the bone looked healthy so I don't think any bone was actually removed. He has been using silver alginate based dressings on the same wound area at the base of the left fifth metatarsal on the left. I note that he is also had arterial studies on 01/08/16, these showed a left ABI of 1.2 to and a right ABI of 1.3. Waveforms were listed as biphasic. He was not felt to have any specific arterial issues. 03/13/16; no real change in the condition of the wound at the left lateral foot at roughly the base of his fifth metatarsal. Use silver alginate last week. 03/18/16 arrives today with no open area. Being suspicious of the overlying callus I. Some of this back although I see nothing but covering tissue here/epithelium. There is no surrounding tenderness READMISSION 04/16/16 this is a patient I discharged about a month ago. Initially a surgical wound on the lateral aspect of his left foot which subsequently became infected. The story he is giving today that he went back into his own modified shoe started to notice pain 2 weeks ago he was seen in Dr. Nona Dell office by a physician assistant last Wednesday and according the patient was told that everything looked fine however this is clearly now broken open and he has an open wound in the same spot that we have been dealing with repetitively. Situation is complicated  by the fact that he is running short of money on long-term disability. He has not taken his insulin and at least 2 weeks was previously on NovoLog short acting insulin on a sliding scale and TRESIBA 35 units at bedtime. He is no longer able to afford any of his medication he was in the x-ray on 10/15. A plain x-ray showed healed fracture of the left fifth metatarsal bone status post removal of the associated plate and screw fixation hardware. There was no acute appearing osseous abnormality. His blood work showed a white count of 9.2 with an essentially normal differential comprehensive metabolic panel was normal. Previous CT scan of the foot on 01/08/16 showed no osteomyelitis previous vascular workup showed no evidence of significant PAD on 01/13/16 [name], [name] ([id number]) 04/23/16; culture I  did last week grew enterococcus [ampicillin sensitive] and MRSA. He saw infectious disease yesterday. They stop the clindamycin and ordered an MRI. This is not unreasonable. All the hardware is out of the foot at this point. 04/30/16 at this point in time we are still awaiting the results of the MRI at this point in time. Patient did have an area which appears to be somewhat macerated in the proximal portion of the wound where there is overlying necrotic skin that is doing nothing more than trapping fluid underneath. He continues to state that he does have some discomfort and again the concern is for the possibility of osteomyelitis hence the reason for the MRI order. We have been using a silver alginate dressing but again I think the reason this with his macerated as it was is that the dressing obscene could not reach the entirety of the wound due to some necrotic skin covering the proximal portion. No bleeding noted at this point in time on initial evaluation. 05/07/16 we receive the results of patient's MRI which shows that he has no evidence of osteomyelitis. This obviously is excellent news.  Patient was definitely happy to hear this. He tells me the wound appears to be doing very well at this point in time and is pleased with the progress currently. 05/15/16; as noted the patient did not have osteomyelitis. He has been released by infectious disease and orthopedics. His wound is still open he had a debridement last week but complain when he got home he "bleeding for half a day". He is not had any pain. We have been using silver alginate with Kerlix gauze wrap. 05/28/16; patient arrives today with the wound in much the same condition as last time. He has a small opening on the lateral aspect of his foot however with debridement there is clear undermining medially there is no real evidence of infection here and I didn't see any point in culturing this. One would have to wonder if this isn't a simple matter of in adequate offloading as he is been using a healing sandal. 06/05/16; the open area is now on the plantar aspect of his foot and not decide. The wound almost appears to have "migrated". This was the term use by our intake nurse. 06/12/16; open area on the plantar aspect of his foot. Base of the wound looks very healthy. This will be his second week in a total contact cast 06/19/16; patient arrived today in a total contact cast. There was some expectation from our staff and myself that this area would be healed. Unfortunately the area was boggy and with rec pressure a fairly substantial amount of purulent drainage was obtained. Specimen obtained for culture. The patient had no complaints of systemic problems including fever or chills or instability of his diabetes. There was no pain in the foot. Nevertheless a extensive debridement was required. 06/26/16; patient's culture from the abscess last week grew a combination of MRSA and ampicillin sensitive enterococcus. I had him on Augmentin and Septra however I have elected to give him a full 10 day course of Zyvox instead as I Recently  treated this combination of organisms with Augmentin and Septra before. Arrives today with no systemic symptoms 07/03/16; the patient has 2 more treatments of Zyvox and then he is finished antibiotics the wound has improved now mostly on the lateral aspect of his foot. There is still some tenderness when he walks. 07/10/16; patient arrives today with Zyvox completed. He only has a small open  area remaining. 07/24/16; she arrives here with no overt open area. The covering is thick callus/eschar. Nevertheless there is no open area here. He has some tenderness underneath the area but no overt infection is observed no drainage. The patient has a deformity in the foot with this area weekly to be exposed to more pressure in his foot where nevertheless that something we are going to have to deal with going forward. The patient has diabetic workboots and diabetic shoes. He has had a long difficult course with the area here. This started as a fracture at work. He had bone grafting from his calcaneus and screws. This got infected he had to have more surgery on the area. Bone at the time of that surgery I think showed enterococcus and group B strep. He had 6 weeks of Rocephin. Since then the area has waxed and waned in its difficulty. Recently he had an MRI in December that did not show osteomyelitis nevertheless he had an abscess that grew MRSA and enterococcus which I elected to treat with Zyvox. This was in December and this wound is actually "healed" over 08/21/16; the patient came in today for his one-month follow-up visit. The area on the lateral aspect of his left foot looks much the same as some month ago. There is no evidence of an open wound here. However the patient tells me about a week after he went back to work he developed severe pain and swelling in the plantar aspect of his right foot first and fifth metatarsal heads. 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 He went to see his podiatrist at [facility]. She told him that he could not work on his feet. She told him to go back and his cam walking boot on the left. Not have open wounds obviously on the right. The patient is actually gone ahead and retired from his job because he does not feel he can work on his feet 09/18/16; patient comes back in saying he recently had some pain on the lateral aspect of his left foot. Asked that we look at this. Other than that all of his wounds have a viable surface. He has diabetic shoes. He is retired from work. READMISSION [name] is a 60 year old man who we cared for for a prolonged period of time in late 2017 into March 2018. At that point he had suffered a fracture of his left fifth metatarsal at about the level of the base of the fifth metatarsal. He had surgical repair by Dr. Meryl Dare and he developed a very refractory wound over the fifth metatarsal. Ultimately this became infected he required hardware removal this eventually closed over and we discharged from the clinic in March of this year. He tells me as well until roughly 3 weeks ago when he developed increasing pain in the same area making it difficult for him to sleep. He was admitted to hospital from 9/5 through 9/7 with now an ulcer in the same area on the lateral aspect of his left foot at the level of the base of the metatarsal. X-rays and MRIs during this hospitalization were negative. Wound culture showed a combination of Escherichia coli, Morganella and Pseudomonas. He was given IV Cipro and vancomycin the hospital and then discharged home on Cipro and Doxy. He returned to the ER on 9/16 with leg swelling and discomfort. I don't think anything was added at that  point although he did have an ultrasound of the left leg that was negative for DVT. He was back in the ER on 10/4 again with pain in the area he was given Bactrim but states he had a  significant allergic reaction to that which has abated once he stopped the Bactrim. I don't think he had a full course. He went to see his podiatrist yesterday who recommended some form of foot soak. He has a Darco forefoot offloading boot The patient is a type II diabetic on insulin. Tells me his recent hemoglobin A1c was 7. Arterial studies done in July 2017 showed an ABI in the right of 1.3 on the left 1.2 to biphasic waveforms bilaterally. He was not felt to have arterial disease. As noted the patient has had 2 MRIs most recently on 10/4 this showed no evidence of an abscess or osteomyelitis and no change since the prior study of 03/05/17 there was nonspecific edema on the dorsum of the foot. ABIs in this clinic today 1.2 which would be unchanged from his study done in July/17 04/15/17; now down to 1 small wound on the left lateral foot. We wrapped him and applied collagen last week and the foot is fairly macerated. 04/22/17; small wound on the left lateral foot however it has some undermining. Using collagen 04/29/17; small wound on the left lateral foot some surrounding callus. Chronic damage in this area as a result of initial fracture nonhealing and secondary infection. 05/06/17; the patient arrives today with the area on the left lateral foot closed. There is thick subcutaneous tissue with a layer of callus. I took some of the callus off just to ensure adequate closure of the underlying tissue and there is no open wound here. He has considerable deformity of this area of his foot however and unless there is an ability to offload this I think opening is going to be recurrent. He tells me he has diabetic foot wear although I have not actually seen this Readmission: [name], [name] ([id number]) 09/02/17 on evaluation today patient appears to be doing a little better compared to what he tells me what's going on in regard to his left lateral foot. He was previously seen in the ER this past Saturday  it is now Tuesday. On Saturday he actually was treated with antibiotics including Cipro 500 mg two times a day and doxycycline 100 mg two times a day. Subsequently he also did have an x-ray performed of his left foot which revealed increased degenerative changes of the base of the fifth metatarsal since comparison in 2018. There was no radiographic evidence of osteomyelitis however. Patient has previously had an MRI of the left foot which was performed on 04/03/17 in this revealed at that point a soft tissue ulceration but no evidence of osteomyelitis. Patient has previously undergone testing in regard to his ABI's by Dr. Bridgett Larsson and this showed that he did have ABI was within normal limits which does correspond with ABI's we checked here in the office today. That study was on 01/13/16. With all that being said on evaluation today there does not appear to be any evidence of a true open wound of the left lateral fifth metatarsal region. He does have tenderness noted as well is a little bit of boggy/fluctuance feeling to the area in this region of his foot and there is definitely a very solid and firm callous noted laterally. With that being said I am not able to find any obvious opening at this  point there does appear to be a spot where there appears to been a opening very recently however patient states he has not noted any drainage from this currently. He does however have discomfort with palpation of this area this is in the 3-4/10 range only with very firm palpation this is not too significant at all. Subsequently he does have a small ulcer at the medial nail bed of the right second toe where he inadvertently pulled off a portion of the toenail softly causing this injury. Otherwise there does not appear to be any evidence of infection in regard to this right second toe. 09/23/17 on evaluation today patient actually appears to be doing well he has no open ulcerations noted at this point. With that being  said he did have significant callous noted over the left lateral foot which did require callous pairing today he tolerated this without complication. Readmission: 11/07/17 patient presents today for readmission concerning to ulcers that he has. One on the right plantar fifth metatarsal region and the other on the left plantar fifth metatarsal region. He has previously had an x-ray which was performed on 11/05/17 and revealed that the patient had wounds noted of the base of the fifth metatarsal region without evidence of destruction to the bone to suggest osteomyelitis. Obviously this is excellent news. With that being said he does have a significantly large ulcer that extensively plantar surface of the wound and is concerning for the fact that he continues to walk on this due to the patient telling me that he "has to work" I do believe this has likely led to both the opening of the wound as well as the fact that is not really able to heal very well at this point. He has been seen by his podiatrist and apparently according to the note dated 10/23/17 the patient was provided with Santyl and recommended a dry dressing. No wound culture has been obtained at this point. As stated above he has had returned to work due to financial reasons and is not able to wear his postop shoes due to the fact that he cannot wear this and work. The wound is stated to have been present for "several weeks" prior to the visit with the podiatrist on 10/23/17. With that being said again at least the good news is there does not appear to be any evidence of osteomyelitis. 11/14/17 on evaluation today patient appears to be doing poorly in regard to his bilateral foot ulcers. Unfortunately he seems to have significant callous with a lot of maceration underlined especially on the right even more so than the left. He has been tolerating the dressing changes without complication. With that being said overall I do think he's gonna have to  have a significant debridement today. 11/21/17 on evaluation today patient presents for follow-up concerning his bilateral lateral foot ulcers. He has been tolerating the dressing changes without complication. His wife helps perform these for him. With that being said the patient states that the left lateral foot ulcer actually is continuing to give him trouble as far as discomfort is concerned. With that being said actually appears better. 11/28/17 on evaluation today patient appears to be doing poorly in regard to his left lower extremity ulcer in particular. His right as made some progress on the lateral portion although the plantar portion is actually getting somewhat worse I think this is likely due to the fact that he continues to work although it hurts and he's been having a lot  of issues out of this side and as far as pain is concerned. With that being said my biggest concern on evaluation today is that of the patient having issues with increased pain in the left lower extremity ulcer site especially on the plantar aspect. He also appears to have what seems to be an abscess developing in the dorsal/lateral portion of his foot which I think likely is communicating somewhere internally with the ulcer site. Nonetheless he has not had any fevers, chills, nausea, or vomiting noted at this time. He has however had fairly significant increase in pain and he was Artie having a lot of pain previous. Readmission: 01-29-2022 upon evaluation today patient presents for initial inspection here in our clinic concerning the current issue which has been present since around Oct 29, 2021. The patient tells me that he has at this point been seen by Dr. Vickki Muff who has been managing this up to this point. He has also been seen by Dr. Lazaro Arms I will outline the findings there shortly as well. With that being said currently according to Dr. Alvera Singh note on 6-20- 2023 the patient has a prescription for Zosyn that was given by  infectious disease. Again that was back in June however. There was a hospital encounter for incision and drainage and debridement of the foot in order to clearway necrotic tissue. The patient also underwent vascular evaluation with Dr. Lucky Cowboy. Nonetheless on the postop visit on 12-18-2021 the patient was seeming to be doing quite well based on what Dr. Vickki Muff said but nonetheless since that time is really not made much progress and is concerned about losing his foot. He therefore requested a referral to wound care to be seen and evaluated and see if there is anything we can do to help and aid with getting this to heal. Fortunately there does not appear to be any evidence of active infection locally or systemically at this time which is great news. He tells me that he is currently using Betadine wet-to-dry dressings. He does not note any systemic infections but does have evidence of local infection based on what we are seeing. He has had a recent chest x-ray which showed that he had no evidence of acute cardiopulmonary disease that was performed on 12-12-2021. He also underwent vascular intervention with Dr. 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 Subsequently the patient states that Dr. Lucky Cowboy has mentioned that he would need to go back at some time with Dr. Lucky Cowboy said he would consider a posterior tibial approach for revascularization in the future but at this time because of kidney status and the amount of dye used he did not want to proceed any further at that point. Again that something that would likely be scheduled down the road. Nonetheless right now the patient does have an ABI on the left of 0.90 with a TBI of 0.6-0 but this was decreased compared to previous. His ABI on the right was 1.08. Patient did have as well MRI of the left foot which  was performed on 12-12-2021 and this shows that he does have interval resection of the proximal third of the fifth metatarsal and area of osteomyelitis on prior MRI. There is persistent ulceration of plantar lateral aspect at the resection site with a sinus tract extending towards the adjacent cuboid bone  findings suspicious for osteomyelitis of the plantar lateral aspect of the cuboid and adjacent base of the fourth metatarsal possibly in the proximal most aspect of the remaining fifth metatarsal shaft as well. The ABI performed on 12-13-2021 showed that the left ABI was 0.83 which had previously been registered at 1.35 and the right ABI was 1.20. I think this is consistent with being able to heal but obviously would do better with vascular intervention not something that is going to be looked into in the future. This was noted above as well. Based on what I am seeing today I do believe that the patient is definitely a candidate for HBO therapy I think this may be in his best interest as I think he is at the point of being in a limb threatening situation. I do believe that he would benefit from hyperbarics along with continued antibiotics along with good wound care measures as well. Patient History Information obtained from Patient. KASHUS, KARLEN ([id number]) Allergies Bactrim Family History Diabetes - Mother,Father, Heart Disease - Father,Mother, Hypertension - Mother,Father, Stroke - Father, No family history of Cancer, Hereditary Spherocytosis, Kidney Disease, Lung Disease, Seizures, Thyroid Problems, Tuberculosis. Social History Never smoker, Marital Status - Married, Alcohol Use - Never, Drug Use - No History, Caffeine Use - Daily. Medical History Eyes Denies history of Cataracts, Glaucoma, Optic Neuritis Ear/Nose/Mouth/Throat Denies history of Chronic sinus problems/congestion, Middle ear problems Hematologic/Lymphatic Denies history of Anemia, Hemophilia, Human Immunodeficiency  Virus, Lymphedema, Sickle Cell Disease Respiratory Denies history of Aspiration, Asthma, Chronic Obstructive Pulmonary Disease (COPD), Pneumothorax, Sleep Apnea, Tuberculosis Cardiovascular Patient has history of Hypertension Denies history of Angina, Arrhythmia, Congestive Heart Failure, Coronary Artery Disease, Deep Vein Thrombosis, Hypotension, Myocardial Infarction, Peripheral Arterial Disease, Peripheral Venous Disease, Phlebitis, Vasculitis Gastrointestinal Denies history of Cirrhosis , Colitis, Crohn s, Hepatitis A, Hepatitis B, Hepatitis C Endocrine Patient has history of Type II Diabetes Denies history of Type I Diabetes Genitourinary Denies history of End Stage Renal Disease Immunological Denies history of Lupus Erythematosus, Raynaud s, Scleroderma Integumentary (Skin) Denies history of History of Burn, History of pressure wounds Musculoskeletal Denies history of Gout, Rheumatoid Arthritis, Osteoarthritis, Osteomyelitis Neurologic Patient has history of Neuropathy Denies history of Dementia, Quadriplegia, Paraplegia, Seizure Disorder Oncologic Denies history of Received Chemotherapy, Received Radiation Psychiatric Denies history of Anorexia/bulimia, Confinement Anxiety Patient is treated with Insulin, Oral Agents. Blood sugar is tested. Review of Systems (ROS) Eyes Complains or has symptoms of Glasses / Contacts. Integumentary (Skin) Complains or has symptoms of Wounds, Swelling. Objective Constitutional patient is hypertensive.. pulse regular and within target range for patient.Marland Kitchen respirations regular, non-labored and within target range for patient.Marland Kitchen temperature within target range for patient.. Well-nourished and well-hydrated in no acute distress. Vitals Time Taken: 8:48 AM, Height: 70 in, Source: Stated, Weight: 255 lbs, Source: Stated, BMI: 36.6, Temperature: 98.3 F, Pulse: 93 bpm, Respiratory Rate: 18 breaths/min, Blood Pressure: 175/89  mmHg. Eyes conjunctiva clear no eyelid edema noted. pupils equal round and reactive to light and accommodation. Ears, Nose, Mouth, and Throat no gross abnormality of ear auricles or external auditory canals. normal hearing noted during conversation. mucus membranes moist. [name], SCHOPF. ([id number]) Respiratory normal breathing without difficulty. Cardiovascular 2+ dorsalis pedis/posterior tibialis pulses. no clubbing, cyanosis, significant edema, Musculoskeletal normal gait and posture. no significant deformity or arthritic changes, no loss or range of motion, no clubbing. Psychiatric this patient is able to make decisions and demonstrates good insight into disease process. Alert and Oriented x 3. pleasant and cooperative. General  Notes: Upon inspection patient's wound is actually significant with a fairly deep ulceration. At this point I do not see direct bone exposure which is good news. With that being said there was an 8 here today for some sharp debridement of clearway some of the necrotic debris and the patient tolerated that today without complication. This was included a lot of callus along with slough and dead tissue as well as biofilm down to good subcutaneous tissue. Postdebridement and looks better but there is still a ways to go to try to get this thing to heal. Integumentary ([name], Skin) Wound #14 status is Open. Original cause of wound was Blister. The date acquired was: 10/29/2021. The wound is located on the Left,Lateral Foot. The wound measures 2.5cm length x 5.5cm width x 2.5cm depth; 10.799cm^2 area and 26.998cm^3 volume. There is bone and Fat Layer (Subcutaneous Tissue) exposed. There is undermining starting at 9:00 and ending at 12:00 with a maximum distance of 3.8cm. There is a medium amount of sanguinous drainage noted. There is medium (34-66%) red, pink granulation within the wound bed. There is a medium (34- 66%) amount of necrotic tissue within the wound bed  including Adherent Slough. Assessment Active Problems ICD-10 Chronic multifocal osteomyelitis, left ankle and foot Type 2 diabetes mellitus with foot ulcer Disruption of external operation (surgical) wound, not elsewhere classified, initial encounter Non-pressure chronic ulcer of other part of left foot with necrosis of bone Procedures Wound #14 Pre-procedure diagnosis of Wound #14 is a Diabetic Wound/Ulcer of the Lower Extremity located on the Left,Lateral Foot .Severity of Tissue Pre Debridement is: Fat layer exposed. There was a Excisional Skin/Subcutaneous Tissue Debridement with a total area of 27.5 sq cm performed by Tommie Sams., PA-C. With the following instrument(s): Forceps, and Scissors to remove Viable and Non-Viable tissue/material. Material removed includes Eschar, Callus, Subcutaneous Tissue, Skin: Dermis, and Skin: Epidermis. 1 specimen was taken by a Swab and sent to the lab per facility protocol. A time out was conducted at 09:40, prior to the start of the procedure. A Moderate amount of bleeding was controlled with Pressure. The procedure was tolerated well with a pain level of 0 throughout and a pain level of 0 following the procedure. Post Debridement Measurements: 5cm length x 6.5cm width x 2.5cm depth; 63.814cm^3 volume. Character of Wound/Ulcer Post Debridement is improved. Severity of Tissue Post Debridement is: Fat layer exposed. Post procedure Diagnosis Wound #14: Same as Pre-Procedure Plan Follow-up Appointments: Return Appointment in 1 week. Bathing/ Shower/ Hygiene: May shower with wound dressing protected with water repellent cover or cast protector. Edema Control - Lymphedema / Segmental Compressive Device / Other: Elevate, Exercise Daily and Avoid Standing for Long Periods of Time. Elevate legs to the level of the heart and pump ankles as often as possible Elevate leg(s) parallel to the floor when sitting. Off-Loading: Open toe surgical shoe -  left Other: - knee scotter Hyperbaric Oxygen Therapy: Evaluate for HBO Therapy [name], [name] ([id number]) Indication and location: If appropriate for treatment, begin HBOT per protocol: 2.5 ATA for 90 Minutes with 2 Five (5) Minute Air Breaks One treatment per day (delivered Monday through Friday unless otherwise specified in Special Instructions below): Total # of Treatments: - 40 Antihistamine 30 minutes prior to HBO Treatment, difficulty clearing ears. Finger stick Blood Glucose Pre- and Post- HBOT Treatment. Follow Hyperbaric Oxygen Glycemia Protocol WOUND #14: - Foot Wound Laterality: Left, Lateral Cleanser: [employer]ram Ancillary Kit - 15 Day Supply (DME) (Generic) 3 x Per Week/30 Days  Discharge Instructions: Use supplies as instructed; Kit contains: (15) Saline Bullets; (15) 3x3 Gauze; 15 pr Gloves Cleanser: Soap and Water 3 x Per Week/30 Days Discharge Instructions: Gently cleanse wound with antibacterial soap, rinse and pat dry prior to dressing wounds Cleanser: Wound Cleanser 3 x Per Week/30 Days Discharge Instructions: Wash your hands with soap and water. Remove old dressing, discard into plastic bag and place into trash. Cleanse the wound with Wound Cleanser prior to applying a clean dressing using gauze sponges, not tissues or cotton balls. Do not scrub or use excessive force. Pat dry using gauze sponges, not tissue or cotton balls. Primary Dressing: Silvercel 4 1/4x 4 1/4 (in/in) (DME) (Generic) 3 x Per Week/30 Days Discharge Instructions: Apply Silvercel 4 1/4x 4 1/4 (in/in) as instructed Secondary Dressing: ABD Pad 5x9 (in/in) (DME) (Generic) 3 x Per Week/30 Days Discharge Instructions: Cover with ABD pad Secured With: Medipore Tape - 35M Medipore H Soft Cloth Surgical Tape, 2x2 (in/yd) (DME) (Generic) 3 x Per Week/30 Days Secured With: [employer] Sterile or Non-Sterile 6-ply 4.5x4 (yd/yd) (DME) (Generic) 3 x Per Week/30 Days Discharge Instructions: Apply Kerlix as  directed 1. I would recommend currently based on what I am seeing that we go ahead and initiate treatment today with wound care measures which include the recommendation for hyperbaric oxygen therapy. I think that this is a limb [name] situation and we need to try to get things started as soon as possible as far as I am concerned. The patient is in agreement with plan I think 2.5 ATA with two 5-minute air breaks would be ideal for a total of 40 treatments and we will place this in as far as orders are concerned at this point. 2. I am also can recommend that we have him wear the surgical shoe along with using a knee scooter to try to keep pressure off completely he does not need to be standing on this at all working have to be extremely aggressive if he is can have any chance to get this to heal. 3. I am going to also go ahead and initiate treatment with a silver alginate dressing followed by ABD pad and roll gauze to secure in place. 4. With regard to the hyperbarics it appears that his insurance does not primarily cover this and therefore working to have to actually try to please the case for getting this approved. Again with his history of previous amputation on the right followed with the severity of the wound which I do believe is a life and limb threatening situation that needs to be addressed as soon as possible his only other primary treatment is amputation and obviously he wants to avoid that. I do not think were quite at that point as of yet but we do need to be aggressive in treatment which is why above I recommended complete offloading using knee scooter, continued antibiotic therapy, and hyperbarics as the primary treatment of choice in order to get this moving in the right direction and hopefully get this wound to heal. As I explained to the patient today there is still a chance that he may end up with amputation even with all this but again I cannot obviously predict the future but I do  believe that it is integral to his care to begin hyperbarics as soon as possible. We will therefore send notes and documentation to his insurance to try to get this approved as soon as possible. We will see patient back for reevaluation in  1 week here in the clinic. If anything worsens or changes patient will contact our office for additional recommendations. Electronic Signature(s) Signed: 01/31/2022 11:01:40 AM By: Worthy Keeler PA-C Entered By: Worthy Keeler on 01/31/2022 11:01:39 KOLBEY, TEICHERT ([id number]) -------------------------------------------------------------------------------- ROS/PFSH Details Patient Name: [name]. Date of Service: 01/29/2022 8:45 AM Medical Record Number: [id number] Patient Account Number: [id number] Date of Birth/Sex: 02-24-1961 (61 y.o. M) Treating RN: Carlene Coria Primary Care Provider: [name] Other Clinician: Referring Provider: [name] Treating Provider/Extender: Skipper Cliche in Treatment: 0 Label Progress Note Print Version as History and Physical for this encounter Information Obtained From Patient Eyes Complaints and Symptoms: Positive for: Glasses / Contacts Medical History: Negative for: Cataracts; Glaucoma; Optic Neuritis Integumentary (Skin) Complaints and Symptoms: Positive for: Wounds; Swelling Medical History: Negative for: History of Burn; History of pressure wounds Ear/Nose/Mouth/Throat Medical History: Negative for: Chronic sinus problems/congestion; Middle ear problems Hematologic/Lymphatic Medical History: Negative for: Anemia; Hemophilia; Human Immunodeficiency Virus; Lymphedema; Sickle Cell Disease Respiratory Medical History: Negative for: Aspiration; Asthma; Chronic Obstructive Pulmonary Disease (COPD); Pneumothorax; Sleep Apnea; Tuberculosis Cardiovascular Medical History: Positive for: Hypertension Negative for: Angina; Arrhythmia; Congestive Heart Failure; Coronary Artery Disease; Deep  Vein Thrombosis; Hypotension; Myocardial Infarction; Peripheral Arterial Disease; Peripheral Venous Disease; Phlebitis; Vasculitis Gastrointestinal Medical History: Negative for: Cirrhosis ; Colitis; Crohnos; Hepatitis A; Hepatitis B; Hepatitis C Endocrine Medical History: Positive for: Type II Diabetes Negative for: Type I Diabetes Time with diabetes: 30 yrs Treated with: Insulin, Oral agents Blood sugar tested every day: Yes Tested : 3 times weekly Genitourinary [name], [name] ([id number]) Medical History: Negative for: End Stage Renal Disease Immunological Medical History: Negative for: Lupus Erythematosus; Raynaudos; Scleroderma Musculoskeletal Medical History: Negative for: Gout; Rheumatoid Arthritis; Osteoarthritis; Osteomyelitis Neurologic Medical History: Positive for: Neuropathy Negative for: Dementia; Quadriplegia; Paraplegia; Seizure Disorder Oncologic Medical History: Negative for: Received Chemotherapy; Received Radiation Psychiatric Medical History: Negative for: Anorexia/bulimia; Confinement Anxiety Immunizations Pneumococcal Vaccine: Received Pneumococcal Vaccination: No Immunization Notes: up to date Implantable Devices No devices added Family and Social History Cancer: No; Diabetes: Yes - Mother,Father; Heart Disease: Yes - Father,Mother; Hereditary Spherocytosis: No; Hypertension: Yes - Mother,Father; Kidney Disease: No; Lung Disease: No; Seizures: No; Stroke: Yes - Father; Thyroid Problems: No; Tuberculosis: No; Never smoker; Marital Status - Married; Alcohol Use: Never; Drug Use: No History; Caffeine Use: Daily; Financial Concerns: No; Food, Clothing or Shelter Needs: No; Support System Lacking: No; Transportation Concerns: No Electronic Signature(s) Signed: 01/29/2022 4:03:12 PM By: Carlene Coria RN Signed: 01/31/2022 6:41:29 PM By: Worthy Keeler PA-C Entered By: Carlene Coria on 01/29/2022 08:50:32 [name]  ([id number]) -------------------------------------------------------------------------------- SuperBill Details Patient Name: [name]. Date of Service: 01/29/2022 Medical Record Number: [id number] Patient Account Number: [id number] Date of Birth/Sex: Jul 25, 1960 (61 y.o. M) Treating RN: Carlene Coria Primary Care Provider: [name] Other Clinician: Referring Provider: [name] Treating Provider/Extender: Skipper Cliche in Treatment: 0 Diagnosis Coding ICD-10 Codes Code Description 2083735973 Chronic multifocal osteomyelitis, left ankle and foot E11.621 Type 2 diabetes mellitus with foot ulcer T81.31XA Disruption of external operation (surgical) wound, not elsewhere classified, initial encounter L97.524 Non-pressure chronic ulcer of other part of left foot with necrosis of bone Facility Procedures CPT4 Code: 94854627 Description: 99213 - WOUND CARE VISIT-LEV 3 EST PT Modifier: Quantity: 1 CPT4 Code: 03500938 Description: 11042 - DEB SUBQ TISSUE 20 SQ CM/< Modifier: Quantity: 1 CPT4 Code: Description: ICD-10 Diagnosis Description L97.524 Non-pressure chronic ulcer of other part of left foot with necrosis of bone Modifier:  Quantity: CPT4 Code: 31121624 Description: 46950 - DEB SUBQ TISS EA ADDL 20CM Modifier: Quantity: 1 CPT4 Code: Description: ICD-10 Diagnosis Description L97.524 Non-pressure chronic ulcer of other part of left foot with necrosis of bone Modifier: Quantity: Physician Procedures CPT4 Code: 7225750 Description: 51833 - WC PHYS LEVEL 4 - NEW PT Modifier: 25 Quantity: 1 CPT4 Code: Description: ICD-10 Diagnosis Description M86.372 Chronic multifocal osteomyelitis, left ankle and foot E11.621 Type 2 diabetes mellitus with foot ulcer T81.31XA Disruption of external operation (surgical) wound, not elsewhere classified, i L97.524  Non-pressure chronic ulcer of other part of left foot with necrosis of bone Modifier: nitial encounter Quantity: CPT4  Code: 5825189 Description: 84210 - WC PHYS SUBQ TISS 20 SQ CM Modifier: Quantity: 1 CPT4 Code: Description: ICD-10 Diagnosis Description L97.524 Non-pressure chronic ulcer of other part of left foot with necrosis of bone Modifier: Quantity: CPT4 Code: 3128118 Description: 86773 - WC PHYS SUBQ TISS EA ADDL 20 CM Modifier: Quantity: 1 CPT4 Code: Description: ICD-10 Diagnosis Description L97.524 Non-pressure chronic ulcer of other part of left foot with necrosis of bone Modifier: Quantity: Electronic Signature(s) Signed: 01/31/2022 11:02:02 AM By: Worthy Keeler PA-C Entered By: Worthy Keeler on 01/31/2022 11:02:02

## 2022-02-04 NOTE — Progress Notes (Signed)
QUINDON, DENKER ([id number]) Visit Report for 02/04/2022 Arrival Information Details Patient Name: [name], [name]. Date of Service: 02/04/2022 3:30 PM Medical Record Number: [id number] Patient Account Number: [id number] Date of Birth/Sex: 1960-10-27 (61 y.o. M) Treating RN: Carlene Coria Primary Care Traylon Schimming: [name] Other Clinician: Referring Maksym Pfiffner: [name] Treating Brydon Spahr/Extender: Skipper Cliche in Treatment: 0 Visit Information History Since Last Visit All ordered tests and consults were completed: No Patient Arrived: Ambulatory Added or deleted any medications: No Arrival Time: 15:13 Any new allergies or adverse reactions: No Accompanied By: self Had a fall or experienced change in No Transfer Assistance: None activities of daily living that may affect Patient Identification Verified: Yes risk of falls: Secondary Verification Process Completed: Yes Signs or symptoms of abuse/neglect since last visito No Patient Requires Transmission-Based Precautions: No Hospitalized since last visit: No Patient Has Alerts: No Implantable device outside of the clinic excluding No cellular tissue based products placed in the center since last visit: Has Dressing in Place as Prescribed: Yes Pain Present Now: No Electronic Signature(s) Signed: 02/04/2022 3:56:33 PM By: Carlene Coria RN Entered By: Carlene Coria on 02/04/2022 15:56:33 [name] ([id number]) -------------------------------------------------------------------------------- Clinic Level of Care Assessment Details Patient Name: [name]. Date of Service: 02/04/2022 3:30 PM Medical Record Number: [id number] Patient Account Number: [id number] Date of Birth/Sex: 1961-05-21 (61 y.o. M) Treating RN: Carlene Coria Primary Care Zamire Whitehurst: [name] Other Clinician: Referring Mechele Kittleson: [name] Treating Brantly Kalman/Extender: Skipper Cliche in Treatment: 0 Clinic Level of Care Assessment  Items TOOL 4 Quantity Score X - Use when only an EandM is performed on FOLLOW-UP visit 1 0 ASSESSMENTS - Nursing Assessment / Reassessment X - Reassessment of Co-morbidities (includes updates in patient status) 1 10 X- 1 5 Reassessment of Adherence to Treatment Plan ASSESSMENTS - Wound and Skin Assessment / Reassessment X - Simple Wound Assessment / Reassessment - one wound 1 5 []  - 0 Complex Wound Assessment / Reassessment - multiple wounds []  - 0 Dermatologic / Skin Assessment (not related to wound area) ASSESSMENTS - Focused Assessment []  - Circumferential Edema Measurements - multi extremities 0 []  - 0 Nutritional Assessment / Counseling / Intervention []  - 0 Lower Extremity Assessment (monofilament, tuning fork, pulses) []  - 0 Peripheral Arterial Disease Assessment (using hand held doppler) ASSESSMENTS - Ostomy and/or Continence Assessment and Care []  - Incontinence Assessment and Management 0 []  - 0 Ostomy Care Assessment and Management (repouching, etc.) PROCESS - Coordination of Care X - Simple Patient / Family Education for ongoing care 1 15 []  - 0 Complex (extensive) Patient / Family Education for ongoing care X- 1 10 Staff obtains Programmer, systems, Records, Test Results / Process Orders []  - 0 Staff telephones HHA, Nursing Homes / Clarify orders / etc []  - 0 Routine Transfer to another Facility (non-emergent condition) []  - 0 Routine Hospital Admission (non-emergent condition) []  - 0 New Admissions / Biomedical engineer / Ordering NPWT, Apligraf, etc. []  - 0 Emergency Hospital Admission (emergent condition) X- 1 10 Simple Discharge Coordination []  - 0 Complex (extensive) Discharge Coordination PROCESS - Special Needs []  - Pediatric / Minor Patient Management 0 []  - 0 Isolation Patient Management []  - 0 Hearing / Language / Visual special needs []  - 0 Assessment of Community assistance (transportation, D/C planning, etc.) []  - 0 Additional assistance /  Altered mentation []  - 0 Support Surface(s) Assessment (bed, cushion, seat, etc.) INTERVENTIONS - Wound Cleansing / Measurement EARL, LOSEE F. ([id number]) X- 1 5 Simple Wound Cleansing -  one wound $RemoveB'[]'PBUujSOz$  - 0 Complex Wound Cleansing - multiple wounds X- 1 5 Wound Imaging (photographs - any number of wounds) $RemoveBe'[]'yidnHFyiH$  - 0 Wound Tracing (instead of photographs) X- 1 5 Simple Wound Measurement - one wound $RemoveB'[]'cITtNoei$  - 0 Complex Wound Measurement - multiple wounds INTERVENTIONS - Wound Dressings X - Small Wound Dressing one or multiple wounds 1 10 $Re'[]'ZNL$  - 0 Medium Wound Dressing one or multiple wounds $RemoveBeforeD'[]'UFhFtfTPtOpmjy$  - 0 Large Wound Dressing one or multiple wounds $RemoveBeforeD'[]'ZuMAyPWwWRQqLM$  - 0 Application of Medications - topical $RemoveB'[]'WhyKTmRC$  - 0 Application of Medications - injection INTERVENTIONS - Miscellaneous $RemoveBeforeD'[]'IlAFmRAOFMUUOp$  - External ear exam 0 $Remo'[]'CaaCz$  - 0 Specimen Collection (cultures, biopsies, blood, body fluids, etc.) $RemoveBefor'[]'GJHvEAYeTvzq$  - 0 Specimen(s) / Culture(s) sent or taken to Lab for analysis $RemoveBefo'[]'cIJftHVWchb$  - 0 Patient Transfer (multiple staff / Civil Service fast streamer / Similar devices) $RemoveBeforeDE'[]'tsDvJhGnTRKZDoD$  - 0 Simple Staple / Suture removal (25 or less) $Remove'[]'JGIsKUB$  - 0 Complex Staple / Suture removal (26 or more) $Remove'[]'rPyGwSE$  - 0 Hypo / Hyperglycemic Management (close monitor of Blood Glucose) $RemoveBefore'[]'ZuwhJABXnOIHw$  - 0 Ankle / Brachial Index (ABI) - do not check if billed separately X- 1 5 Vital Signs Has the patient been seen at the hospital within the last three years: Yes Total Score: 85 Level Of Care: New/Established - Level 3 Electronic Signature(s) Unsigned Entered ByCarlene Coria on 02/04/2022 15:32:04 Signature(s): Date(s): [name] ([id number]) -------------------------------------------------------------------------------- Encounter Discharge Information Details Patient Name: [name]. Date of Service: 02/04/2022 3:30 PM Medical Record Number: [id number] Patient Account Number: [id number] Date of Birth/Sex: Feb 06, 1961 (61 y.o. M) Treating RN: Carlene Coria Primary Care Geordan Xu: [name]  Other Clinician: Referring Courteny Egler: [name] Treating Aylin Rhoads/Extender: Skipper Cliche in Treatment: 0 Encounter Discharge Information Items Discharge Condition: Stable Ambulatory Status: Ambulatory Discharge Destination: Home Transportation: Private Auto Accompanied By: self Schedule Follow-up Appointment: Yes Clinical Summary of Care: Electronic Signature(s) Unsigned Entered ByCarlene Coria on 02/04/2022 15:32:53 Signature(s): Date(s): [name] ([id number]) -------------------------------------------------------------------------------- Lower Extremity Assessment Details Patient Name: MATHESON, VANDEHEI. Date of Service: 02/04/2022 3:30 PM Medical Record Number: [id number] Patient Account Number: [id number] Date of Birth/Sex: [birth year] (61 y.o. M) Treating RN: Carlene Coria Primary Care Jama Krichbaum: [name] Other Clinician: Referring Lionell Matuszak: [name] Treating Anirudh Baiz/Extender: Skipper Cliche in Treatment: 0 Edema Assessment Assessed: [Left: No] [Right: No] Edema: [Left: Ye] [Right: s] Calf Left: Right: Point of Measurement: 35 cm From Medial Instep 38 cm Ankle Left: Right: Point of Measurement: 12 cm From Medial Instep 25 cm Knee To Floor Left: Right: From Medial Instep 47 cm Vascular Assessment Pulses: Dorsalis Pedis Palpable: [Left:Yes] Electronic Signature(s) Unsigned Entered ByCarlene Coria on 02/04/2022 15:23:12 Signature(s): Date(s): [name] ([id number]) -------------------------------------------------------------------------------- Multi Wound Chart Details Patient Name: GABRIEN, MENTINK. Date of Service: 02/04/2022 3:30 PM Medical Record Number: [id number] Patient Account Number: [id number] Date of Birth/Sex: 09/26/1960 (61 y.o. M) Treating RN: Carlene Coria Primary Care Alonie Gazzola: [name] Other Clinician: Referring Masiyah Jorstad: [name] Treating Caydence Koenig/Extender: Skipper Cliche in Treatment:  0 Vital Signs Height(in): 70 Pulse(bpm): 98 Weight(lbs): 255 Blood Pressure(mmHg): 171/73 Body Mass Index(BMI): 36.6 Temperature(F): 98.2 Respiratory Rate(breaths/min): 18 Photos: [N/A:N/A] Wound Location: Left, Lateral Foot N/A N/A Wounding Event: Blister N/A N/A Primary Etiology: Diabetic Wound/Ulcer of the Lower N/A N/A Extremity Secondary Etiology: Dehisced Wound N/A N/A Comorbid History: Hypertension, Type II Diabetes, N/A N/A Neuropathy Date Acquired: 10/29/2021 N/A N/A Weeks of Treatment: 0 N/A N/A Wound Status: Open N/A N/A Wound Recurrence: No N/A N/A Pending Amputation on  Yes N/A N/A Presentation: Measurements L x W x D (cm) 4x6.5x1.4 N/A N/A Area (cm) : 20.42 N/A N/A Volume (cm) : 28.588 N/A N/A % Reduction in Area: -89.10% N/A N/A % Reduction in Volume: -5.90% N/A N/A Classification: Grade 3 N/A N/A Exudate Amount: Medium N/A N/A Exudate Type: Sanguinous N/A N/A Exudate Color: red N/A N/A Granulation Amount: Medium (34-66%) N/A N/A Granulation Quality: Red, Pink N/A N/A Necrotic Amount: Medium (34-66%) N/A N/A Exposed Structures: Fat Layer (Subcutaneous Tissue): N/A N/A Yes Bone: Yes Fascia: No Tendon: No Muscle: No Joint: No Epithelialization: None N/A N/A Treatment Notes Electronic Signature(s) Unsigned Entered ByCarlene Coria on 02/04/2022 15:23:27 [name] ([id number]) Signature(s): Date(s): [name] ([id number]) -------------------------------------------------------------------------------- [place] Details Patient Name: TREVEON, BOURCIER. Date of Service: 02/04/2022 3:30 PM Medical Record Number: [id number] Patient Account Number: [id number] Date of Birth/Sex: 1961/06/20 (61 y.o. M) Treating RN: Carlene Coria Primary Care Daysia Vandenboom: [name] Other Clinician: Referring Katarzyna Wolven: [name] Treating Caralee Morea/Extender: Skipper Cliche in Treatment: 0 Active Inactive Wound/Skin Impairment Nursing  Diagnoses: Knowledge deficit related to ulceration/compromised skin integrity Goals: Patient/caregiver will verbalize understanding of skin care regimen Date Initiated: 01/29/2022 Target Resolution Date: 03/01/2022 Goal Status: Active Ulcer/skin breakdown will have a volume reduction of 30% by week 4 Date Initiated: 01/29/2022 Target Resolution Date: 03/01/2022 Goal Status: Active Ulcer/skin breakdown will have a volume reduction of 50% by week 8 Date Initiated: 01/29/2022 Target Resolution Date: 03/31/2022 Goal Status: Active Ulcer/skin breakdown will have a volume reduction of 80% by week 12 Date Initiated: 01/29/2022 Target Resolution Date: 05/01/2022 Goal Status: Active Ulcer/skin breakdown will heal within 14 weeks Date Initiated: 01/29/2022 Target Resolution Date: 05/01/2022 Goal Status: Active Interventions: Assess patient/caregiver ability to obtain necessary supplies Assess patient/caregiver ability to perform ulcer/skin care regimen upon admission and as needed Assess ulceration(s) every visit Notes: Electronic Signature(s) Unsigned Entered ByCarlene Coria on 02/04/2022 15:23:16 Signature(s): Date(s): [name] ([id number]) -------------------------------------------------------------------------------- Pain Assessment Details Patient Name: KENNEN, STAMMER. Date of Service: 02/04/2022 3:30 PM Medical Record Number: [id number] Patient Account Number: [id number] Date of Birth/Sex: 08-16-1960 (61 y.o. M) Treating RN: Carlene Coria Primary Care Nikodem Leadbetter: [name] Other Clinician: Referring Makayla Lanter: [name] Treating Zakeria Kulzer/Extender: Skipper Cliche in Treatment: 0 Active Problems Location of Pain Severity and Description of Pain Patient Has Paino Yes Site Locations With Dressing Change: Yes Duration of the Pain. Constant / Intermittento Constant Rate the pain. Current Pain Level: 7 Worst Pain Level: 8 Least Pain Level: 2 Tolerable Pain Level:  5 Character of Pain Describe the Pain: Aching, Burning Pain Management and Medication Current Pain Management: Medication: Yes Cold Application: No Rest: Yes Massage: No Activity: No T.E.N.S.: No Heat Application: No Leg drop or elevation: No Is the Current Pain Management Adequate: Inadequate How does your wound impact your activities of daily livingo Sleep: Yes Bathing: No Appetite: No Relationship With Others: No Bladder Continence: No Emotions: No Bowel Continence: No Work: No Toileting: No Drive: No Dressing: No Hobbies: No Electronic Signature(s) Unsigned Entered ByCarlene Coria on 02/04/2022 15:14:56 Signature(s): Date(s): [name] ([id number]) -------------------------------------------------------------------------------- Patient/Caregiver Education Details Patient Name: [name]. Date of Service: 02/04/2022 3:30 PM Medical Record Number: [id number] Patient Account Number: [id number] Date of Birth/Gender: 12/30/1960 (60 y.o. M) Treating RN: Carlene Coria Primary Care Physician: [name] Other Clinician: Referring Physician: [name] Treating Physician/Extender: Skipper Cliche in Treatment: 0 Education Assessment Education Provided To: Patient Education Topics Provided Wound/Skin Impairment: Methods: Explain/Verbal Responses:  State content correctly Electronic Signature(s) Unsigned Entered By: Carlene Coria on 02/04/2022 15:32:18 Signature(s): Date(s): PEARSON, PICOU ([id number]) -------------------------------------------------------------------------------- Wound Assessment Details Patient Name: HUGHIE, MELROY. Date of Service: 02/04/2022 3:30 PM Medical Record Number: [id number] Patient Account Number: [id number] Date of Birth/Sex: Oct 30, 1960 (61 y.o. M) Treating RN: Carlene Coria Primary Care Aleyna Cueva: [name] Other Clinician: Referring Terrel Nesheiwat: [name] Treating Sakshi Sermons/Extender: Skipper Cliche in  Treatment: 0 Wound Status Wound Number: 14 Primary Etiology: Diabetic Wound/Ulcer of the Lower Extremity Wound Location: Left, Lateral Foot Secondary Etiology: Dehisced Wound Wounding Event: Blister Wound Status: Open Date Acquired: 10/29/2021 Comorbid History: Hypertension, Type II Diabetes, Neuropathy Weeks Of Treatment: 0 Clustered Wound: No Pending Amputation On Presentation Photos Wound Measurements Length: (cm) 4 Width: (cm) 6.5 Depth: (cm) 2.4 Area: (cm) 20.42 Volume: (cm) 49.009 % Reduction in Area: -89.1% % Reduction in Volume: -81.5% Epithelialization: None Tunneling: No Undermining: No Wound Description Classification: Grade 3 Exudate Amount: Medium Exudate Type: Sanguinous Exudate Color: red Foul Odor After Cleansing: No Slough/Fibrino Yes Wound Bed Granulation Amount: Medium (34-66%) Exposed Structure Granulation Quality: Red, Pink Fascia Exposed: No Necrotic Amount: Medium (34-66%) Fat Layer (Subcutaneous Tissue) Exposed: Yes Tendon Exposed: No Muscle Exposed: No Joint Exposed: No Bone Exposed: Yes Treatment Notes Wound #14 (Foot) Wound Laterality: Left, Lateral Cleanser [place] Ancillary Kit - 15 Day Supply Discharge Instruction: Use supplies as instructed; Kit contains: (15) Saline Bullets; (15) 3x3 Gauze; 15 pr Gloves Soap and Water AJAY, STRUBEL ([id number]) Discharge Instruction: Gently cleanse wound with antibacterial soap, rinse and pat dry prior to dressing wounds Wound Cleanser Discharge Instruction: Wash your hands with soap and water. Remove old dressing, discard into plastic bag and place into trash. Cleanse the wound with Wound Cleanser prior to applying a clean dressing using gauze sponges, not tissues or cotton balls. Do not scrub or use excessive force. Pat dry using gauze sponges, not tissue or cotton balls. Peri-Wound Care Topical Primary Dressing Silvercel 4 1/4x 4 1/4 (in/in) Discharge Instruction: Apply Silvercel 4 1/4x 4  1/4 (in/in) as instructed Secondary Dressing ABD Pad 5x9 (in/in) Discharge Instruction: Cover with ABD pad Secured With [place] Soft Cloth Surgical Tape, 2x2 (in/yd) Kerlix Roll Sterile or Non-Sterile 6-ply 4.5x4 (yd/yd) Discharge Instruction: Apply Kerlix as directed Compression Wrap Compression Stockings Add-Ons Electronic Signature(s) Unsigned Entered ByCarlene Coria on 02/04/2022 15:31:21 Signature(s): Date(s): [name] ([id number]) -------------------------------------------------------------------------------- [place] Details Patient Name: [name]. Date of Service: 02/04/2022 3:30 PM Medical Record Number: [id number] Patient Account Number: [id number] Date of Birth/Sex: 01-Nov-1960 (61 y.o. M) Treating RN: Carlene Coria Primary Care Tzippy Testerman: [name] Other Clinician: Referring Makensey Rego: [name] Treating Tais Koestner/Extender: Skipper Cliche in Treatment: 0 Vital Signs Time Taken: 15:13 Temperature (F): 98.2 Height (in): 70 Pulse (bpm): 98 Weight (lbs): 255 Respiratory Rate (breaths/min): 18 Body Mass Index (BMI): 36.6 Blood Pressure (mmHg): 171/73 Reference Range: 80 - 120 mg / dl Electronic Signature(s) Unsigned Entered ByCarlene Coria on 02/04/2022 15:14:13 Signature(s): Date(s):

## 2022-02-11 ENCOUNTER — Encounter: Payer: 59 | Admitting: Physician Assistant

## 2022-02-11 DIAGNOSIS — M86372 Chronic multifocal osteomyelitis, left ankle and foot: Secondary | ICD-10-CM | POA: Diagnosis not present

## 2022-02-13 NOTE — Progress Notes (Signed)
ALLON, [place] ([id number]) Visit Report for 02/11/2022 Arrival Information Details Patient Name: [name], [name]. Date of Service: 02/11/2022 7:30 AM Medical Record Number: [id number] Patient Account Number: [id number] Date of Birth/Sex: 07/25/1960 (61 y.o. M) Treating RN: Cornell Barman Primary Care Tequisha Maahs: [name] Other Clinician: Referring Osie Merkin: [name] Treating Wahid Holley/Extender: Skipper Cliche in Treatment: 1 Visit Information History Since Last Visit Added or deleted any medications: No Patient Arrived: Ambulatory Has Dressing in Place as Prescribed: Yes Arrival Time: 07:32 Has Footwear/Offloading in Place as Prescribed: Yes Transfer Assistance: None Left: Surgical Shoe with Pressure Relief Patient Identification Verified: Yes Insole Secondary Verification Process Completed: Yes Pain Present Now: No Patient Requires Transmission-Based Precautions: No Patient Has Alerts: No Electronic Signature(s) Signed: 02/11/2022 5:27:45 PM By: Gretta Cool, BSN, RN, CWS, Kim RN, BSN Entered By: Gretta Cool, BSN, RN, CWS, Kim on 02/11/2022 07:33:22 [name] ([id number]) -------------------------------------------------------------------------------- Encounter Discharge Information Details Patient Name: [name], [name]. Date of Service: 02/11/2022 7:30 AM Medical Record Number: [id number] Patient Account Number: [id number] Date of Birth/Sex: August 04, 1960 (61 y.o. M) Treating RN: Cornell Barman Primary Care Samantha Olivera: [name] Other Clinician: Referring Bonnell Placzek: [name] Treating Korinne Greenstein/Extender: Skipper Cliche in Treatment: 1 Encounter Discharge Information Items Discharge Condition: Stable Ambulatory Status: Ambulatory Discharge Destination: Home Transportation: Private Auto Accompanied By: self Schedule Follow-up Appointment: Yes Clinical Summary of Care: Electronic Signature(s) Signed: 02/11/2022 5:27:45 PM By: Gretta Cool, BSN, RN, CWS, Kim RN, BSN Entered  By: Gretta Cool, BSN, RN, CWS, Kim on 02/11/2022 08:13:16 [name] ([id number]) -------------------------------------------------------------------------------- Lower Extremity Assessment Details Patient Name: [name], [name]. Date of Service: 02/11/2022 7:30 AM Medical Record Number: [id number] Patient Account Number: [id number] Date of Birth/Sex: 1960-08-15 (61 y.o. M) Treating RN: Cornell Barman Primary Care Ashutosh Dieguez: [name] Other Clinician: Referring Hiromi Knodel: [name] Treating Bernice Mullin/Extender: Skipper Cliche in Treatment: 1 Edema Assessment Assessed: Shirlyn Goltz: Yes] [Right: No] Edema: [Left: N] [Right: o] Vascular Assessment Pulses: Dorsalis Pedis Palpable: [Left:No Yes] Electronic Signature(s) Signed: 02/11/2022 5:27:45 PM By: Gretta Cool, BSN, RN, CWS, Kim RN, BSN Entered By: Gretta Cool, BSN, RN, CWS, Kim on 02/11/2022 07:49:14 [name] ([id number]) -------------------------------------------------------------------------------- Multi Wound Chart Details Patient Name: [name]. Date of Service: 02/11/2022 7:30 AM Medical Record Number: [id number] Patient Account Number: [id number] Date of Birth/Sex: 08/16/1960 (61 y.o. M) Treating RN: Cornell Barman Primary Care Olusegun Gerstenberger: [name] Other Clinician: Referring Nuel Dejaynes: [name] Treating Kaedynce Tapp/Extender: Skipper Cliche in Treatment: 1 Vital Signs Height(in): 70 Pulse(bpm): 101 Weight(lbs): 255 Blood Pressure(mmHg): 167/82 Body Mass Index(BMI): 36.6 Temperature(F): 97.8 Respiratory Rate(breaths/min): 18 Photos: [N/A:N/A] Wound Location: Left, Lateral Foot N/A N/A Wounding Event: Blister N/A N/A Primary Etiology: Diabetic Wound/Ulcer of the Lower N/A N/A Extremity Secondary Etiology: Dehisced Wound N/A N/A Comorbid History: Hypertension, Type II Diabetes, N/A N/A Neuropathy Date Acquired: 10/29/2021 N/A N/A Weeks of Treatment: 1 N/A N/A Wound Status: Open N/A N/A Wound Recurrence: No N/A  N/A Pending Amputation on Yes N/A N/A Presentation: Measurements L x W x D (cm) 4.2x5.5x1.5 N/A N/A Area (cm) : 18.143 N/A N/A Volume (cm) : 27.214 N/A N/A % Reduction in Area: -68.00% N/A N/A % Reduction in Volume: -0.80% N/A N/A Classification: Grade 3 N/A N/A Exudate Amount: Medium N/A N/A Exudate Type: Sanguinous N/A N/A Exudate Color: red N/A N/A Wound Margin: Epibole N/A N/A Granulation Amount: Medium (34-66%) N/A N/A Granulation Quality: Red, Pink N/A N/A Necrotic Amount: Medium (34-66%) N/A N/A Exposed Structures: Fat Layer (Subcutaneous Tissue): N/A N/A Yes Muscle: Yes Fascia: No Tendon: No  Joint: No Bone: No Epithelialization: None N/A N/A Treatment Notes Electronic Signature(s) Signed: 02/11/2022 5:27:45 PM By: Gretta Cool, BSN, RN, CWS, Kim RN, BSN [address] [place] ([id number]) Entered By: Gretta Cool, BSN, RN, CWS, Kim on 02/11/2022 07:56:05 [name] ([id number]) -------------------------------------------------------------------------------- [place] Details Patient Name: [name], [name]. Date of Service: 02/11/2022 7:30 AM Medical Record Number: [id number] Patient Account Number: [id number] Date of Birth/Sex: [birth year] (61 y.o. M) Treating RN: Cornell Barman Primary Care Sebrena Engh: [name] Other Clinician: Referring Demiyah Fischbach: [name] Treating Mellony Danziger/Extender: Skipper Cliche in Treatment: 1 Active Inactive HBO Nursing Diagnoses: Anxiety related to feelings of confinement associated with the hyperbaric oxygen chamber Anxiety related to knowledge deficit of hyperbaric oxygen therapy and treatment procedures Discomfort related to temperature and humidity changes inside hyperbaric chamber Potential for barotraumas to ears, sinuses, teeth, and lungs or cerebral gas embolism related to changes in atmospheric pressure inside hyperbaric oxygen chamber Potential for oxygen toxicity seizures related to delivery of 100% oxygen at an  increased atmospheric pressure Potential for pulmonary oxygen toxicity related to delivery of 100% oxygen at an increased atmospheric pressure Goals: Barotrauma will be prevented during HBO2 Date Initiated: 02/11/2022 Target Resolution Date: 02/11/2022 Goal Status: Active Patient and/or family will be able to state/discuss factors appropriate to the management of their disease process during treatment Date Initiated: 02/11/2022 Target Resolution Date: 02/11/2022 Goal Status: Active Patient will tolerate the hyperbaric oxygen therapy treatment Date Initiated: 02/11/2022 Target Resolution Date: 02/11/2022 Goal Status: Active Patient will tolerate the internal climate of the chamber Date Initiated: 02/11/2022 Target Resolution Date: 02/12/2022 Goal Status: Active Patient/caregiver will verbalize understanding of HBO goals, rationale, procedures and potential hazards Date Initiated: 02/11/2022 Target Resolution Date: 02/11/2022 Goal Status: Active Signs and symptoms of pulmonary oxygen toxicity will be recognized and promptly addressed Date Initiated: 02/11/2022 Target Resolution Date: 02/11/2022 Goal Status: Active Signs and symptoms of seizure will be recognized and promptly addressed ; seizing patients will suffer no harm Date Initiated: 02/11/2022 Target Resolution Date: 02/11/2022 Goal Status: Active Interventions: Administer a five (5) minute air break for patient if signs and symptoms of seizure appear and notify the hyperbaric physician Administer decongestants, per physician orders, prior to HBO2 Administer the correct therapeutic gas delivery based on the patients needs and limitations, per physician order Assess and provide for patientos comfort related to the hyperbaric environment and equalization of middle ear Assess for signs and symptoms related to adverse events, including but not limited to confinement anxiety, pneumothorax, oxygen toxicity and baurotrauma Assess patient for any  history of confinement anxiety Assess patient's knowledge and expectations regarding hyperbaric medicine and provide education related to the hyperbaric environment, goals of treatment and prevention of adverse events Implement protocols to decrease risk of pneumothorax in high risk patients Notes: Necrotic Tissue Nursing Diagnoses: Impaired tissue integrity related to necrotic/devitalized tissue [name], [name] ([id number]) Knowledge deficit related to management of necrotic/devitalized tissue Goals: Necrotic/devitalized tissue will be minimized in the wound bed Date Initiated: 02/11/2022 Target Resolution Date: 02/12/2022 Goal Status: Active Patient/caregiver will verbalize understanding of reason and process for debridement of necrotic tissue Date Initiated: 02/11/2022 Target Resolution Date: 02/11/2022 Goal Status: Active Interventions: Assess patient pain level pre-, during and post procedure and prior to discharge Provide education on necrotic tissue and debridement process Treatment Activities: Apply topical anesthetic as ordered : 02/11/2022 Enzymatic debridement : 02/11/2022 Excisional debridement : 02/11/2022 Notes: Wound/Skin Impairment Nursing Diagnoses: Knowledge deficit related to ulceration/compromised skin integrity Goals: Patient/caregiver will verbalize understanding of skin care  regimen Date Initiated: 01/29/2022 Target Resolution Date: 03/01/2022 Goal Status: Active Ulcer/skin breakdown will have a volume reduction of 30% by week 4 Date Initiated: 01/29/2022 Target Resolution Date: 03/01/2022 Goal Status: Active Ulcer/skin breakdown will have a volume reduction of 50% by week 8 Date Initiated: 01/29/2022 Target Resolution Date: 03/31/2022 Goal Status: Active Ulcer/skin breakdown will have a volume reduction of 80% by week 12 Date Initiated: 01/29/2022 Target Resolution Date: 05/01/2022 Goal Status: Active Ulcer/skin breakdown will heal within 14 weeks Date Initiated:  01/29/2022 Target Resolution Date: 05/01/2022 Goal Status: Active Interventions: Assess patient/caregiver ability to obtain necessary supplies Assess patient/caregiver ability to perform ulcer/skin care regimen upon admission and as needed Assess ulceration(s) every visit Notes: Electronic Signature(s) Signed: 02/11/2022 5:27:45 PM By: Gretta Cool, BSN, RN, CWS, Kim RN, BSN Entered By: Gretta Cool, BSN, RN, CWS, Kim on 02/11/2022 07:51:01 [name] ([id number]) -------------------------------------------------------------------------------- Pain Assessment Details Patient Name: [name]. Date of Service: 02/11/2022 7:30 AM Medical Record Number: [id number] Patient Account Number: [id number] Date of Birth/Sex: [birth year] (61 y.o. M) Treating RN: Cornell Barman Primary Care Lillyana Majette: [name] Other Clinician: Referring Wilian Kwong: [name] Treating Tomasita Beevers/Extender: Skipper Cliche in Treatment: 1 Active Problems Location of Pain Severity and Description of Pain Patient Has Paino Yes Site Locations Pain Location: Pain in Ulcers Duration of the Pain. Constant / Intermittento Constant Rate the pain. Current Pain Level: 6 Character of Pain Describe the Pain: Aching, Sharp Pain Management and Medication Current Pain Management: Electronic Signature(s) Signed: 02/11/2022 5:27:45 PM By: Gretta Cool, BSN, RN, CWS, Kim RN, BSN Entered By: Gretta Cool, BSN, RN, CWS, Kim on 02/11/2022 07:36:33 [name] ([id number]) -------------------------------------------------------------------------------- Patient/Caregiver Education Details Patient Name: [name]. Date of Service: 02/11/2022 7:30 AM Medical Record Number: [id number] Patient Account Number: [id number] Date of Birth/Gender: 1960/08/15 (61 y.o. M) Treating RN: Cornell Barman Primary Care Physician: [name] Other Clinician: Referring Physician: [name] Treating Physician/Extender: Skipper Cliche in Treatment:  1 Education Assessment Education Provided To: Patient Education Topics Provided Wound Debridement: Handouts: Wound Debridement Methods: Demonstration, Explain/Verbal Responses: See progress note Electronic Signature(s) Signed: 02/11/2022 5:27:45 PM By: Gretta Cool, BSN, RN, CWS, Kim RN, BSN Entered By: Gretta Cool, BSN, RN, CWS, Kim on 02/11/2022 08:12:48 [name] ([id number]) -------------------------------------------------------------------------------- Wound Assessment Details Patient Name: [name], [name]. Date of Service: 02/11/2022 7:30 AM Medical Record Number: [id number] Patient Account Number: [id number] Date of Birth/Sex: 29-Jul-1960 (61 y.o. M) Treating RN: Cornell Barman Primary Care Sheliah Fiorillo: [name] Other Clinician: Referring Keri Tavella: [name] Treating Orazio Weller/Extender: Skipper Cliche in Treatment: 1 Wound Status Wound Number: 14 Primary Etiology: Diabetic Wound/Ulcer of the Lower Extremity Wound Location: Left, Lateral Foot Secondary Etiology: Dehisced Wound Wounding Event: Blister Wound Status: Open Date Acquired: 10/29/2021 Comorbid History: Hypertension, Type II Diabetes, Neuropathy Weeks Of Treatment: 1 Clustered Wound: No Pending Amputation On Presentation Photos Wound Measurements Length: (cm) 4.2 % Red Width: (cm) 5.5 % Red Depth: (cm) 1.5 Epith Area: (cm) 18.143 Tunn Volume: (cm) 27.214 Unde uction in Area: -68% uction in Volume: -0.8% elialization: None eling: No rmining: No Wound Description Classification: Grade 3 Foul Wound Margin: Epibole Sloug Exudate Amount: Medium Exudate Type: Sanguinous Exudate Color: red Odor After Cleansing: No h/Fibrino Yes Wound Bed Granulation Amount: Medium (34-66%) Exposed Structure Granulation Quality: Red, Pink Fascia Exposed: No Necrotic Amount: Medium (34-66%) Fat Layer (Subcutaneous Tissue) Exposed: Yes Necrotic Quality: Adherent Slough Tendon Exposed: No Muscle Exposed: Yes Necrosis  of Muscle: No Joint Exposed: No Bone Exposed: No Treatment Notes Wound #14 (  Foot) Wound Laterality: Left, Lateral Cleanser [place] [facility] - [address] [name], [name] ([id number]) Discharge Instruction: Use supplies as instructed; Kit contains: (15) Saline Bullets; (15) 3x3 Gauze; 15 pr Gloves Soap and Water Discharge Instruction: Gently cleanse wound with antibacterial soap, rinse and pat dry prior to dressing wounds Wound Cleanser Discharge Instruction: Wash your hands with soap and water. Remove old dressing, discard into plastic bag and place into trash. Cleanse the wound with Wound Cleanser prior to applying a clean dressing using gauze sponges, not tissues or cotton balls. Do not scrub or use excessive force. Pat dry using gauze sponges, not tissue or cotton balls. Peri-Wound Care Topical Primary Dressing Silvercel 4 1/4x 4 1/4 (in/in) Discharge Instruction: Apply Silvercel 4 1/4x 4 1/4 (in/in) as instructed Secondary Dressing ABD Pad 5x9 (in/in) Discharge Instruction: Cover with ABD pad Secured With [place] Soft Cloth Surgical Tape, 2x2 (in/yd) Kerlix Roll Sterile or Non-Sterile 6-ply 4.5x4 (yd/yd) Discharge Instruction: Apply Kerlix as directed Compression Wrap Compression Stockings Add-Ons Electronic Signature(s) Signed: 02/11/2022 5:27:45 PM By: Gretta Cool, BSN, RN, CWS, Kim RN, BSN Entered By: Gretta Cool, BSN, RN, CWS, Kim on 02/11/2022 07:55:16 [name] ([id number]) -------------------------------------------------------------------------------- [place] Details Patient Name: [name]. Date of Service: 02/11/2022 7:30 AM Medical Record Number: [id number] Patient Account Number: [id number] Date of Birth/Sex: Sep 30, 1960 (61 y.o. M) Treating RN: Cornell Barman Primary Care Henretta Quist: [name] Other Clinician: Referring Willson Lipa: [name] Treating Laquetta Racey/Extender: Skipper Cliche in Treatment: 1 Vital Signs Time Taken:  07:33 Temperature (F): 97.8 Height (in): 70 Pulse (bpm): 101 Weight (lbs): 255 Respiratory Rate (breaths/min): 18 Body Mass Index (BMI): 36.6 Blood Pressure (mmHg): 167/82 Reference Range: 80 - 120 mg / dl Notes Patient states he did not take BP medication this morning. Electronic Signature(s) Signed: 02/11/2022 5:27:45 PM By: Gretta Cool, BSN, RN, CWS, Kim RN, BSN Entered By: Gretta Cool, BSN, RN, CWS, Kim on 02/11/2022 07:36:15

## 2022-02-14 NOTE — Progress Notes (Addendum)
[name], [name] ([id number]) Visit Report for 02/11/2022 Chief Complaint Document Details Patient Name: [name]. Date of Service: 02/11/2022 7:30 AM Medical Record Number: [id number] Patient Account Number: [id number] Date of Birth/Sex: [birth year] (61 y.o. M) Treating RN: Cornell Barman Primary Care Provider: [name] Other Clinician: Referring Provider: [name] Treating Provider/Extender: Skipper Cliche in Treatment: 1 Information Obtained from: Patient Chief Complaint Left lateral foot ulcer Electronic Signature(s) Signed: 02/11/2022 7:51:58 AM By: Worthy Keeler PA-C Entered By: Worthy Keeler on 02/11/2022 07:51:57 [name] ([id number]) -------------------------------------------------------------------------------- HPI Details Patient Name: [name]. Date of Service: 02/11/2022 7:30 AM Medical Record Number: [id number] Patient Account Number: [id number] Date of Birth/Sex: [birth year] (61 y.o. M) Treating RN: Cornell Barman Primary Care Provider: [name] Other Clinician: Referring Provider: [name] Treating Provider/Extender: Skipper Cliche in Treatment: 1 History of Present Illness HPI Description: Pleasant 61 year old with history of diabetes (Hgb A1c 10.8 in 2014) and peripheral neuropathy. No PVD. L ABI 1.1. Status post right great toe partial amputation years ago. He was at work and on 10/22/2014, was injured by a cart, and suffered an ulceration to his left anterior calf. He says that it subsequently became infected, and he was treated with a course of antibiotics. He was found on initial exam to have an ulceration on the dorsum of his left third toe. He was unaware of this and attributes it to pressure from his steel toed boots. More recently he injured his right anterior calf on a cart. Ambulating normally per his baseline. He has been undergoing regular debridements, applying mupirocin cream, and an Ace wrap for edema  control. He returns to clinic for follow-up and is without complaints. No pain. No fever or chills. No drainage. 10/25/15; this is a 61 year old man who has type II diabetes with diabetic polyneuropathy. He tells me that he fractured his left fifth metatarsal in June 2016 when he presented with swelling. He does not recall a specific injury. His hemoglobin A1c was apparently too high at the time for consideration of surgery and he was put in some form of offloading. Ultimately he went to surgery in December with an allograft from his calcaneus to this site, plate and screws. He had an x-ray of the foot in March that showed concerns about nonunion. He tells me that in March he had to move and basically moved himself. He was on his foot a lot and then noticed some drainage from an open area. He has been following with his orthopedic surgeon Dr. Doran Durand. He has been applying a felt donut, dry dressing and using his heel healing sandal. 11/01/15; this is a patient I saw last week for the first time. He had a small open wound on the plantar aspect of his left foot at roughly the level of the base of his fifth metatarsal. He had a considerable degree of thickened skin around this wound on the plantar aspect which I thought was from chronic pressure on this area. He tells [place] that he had drainage over the course of the week. No systemic symptoms. 11/08/15; culture last week grew Citrobacter korseri. This should've been sensitive to the Augmentin I gave him. He has seen Dr. Doran Durand who did his initial surgery and according the patient the plan is to give this another month and then the hardware might need to come out of this. This seems like a reasonable plan. I will adjust his antibiotics to ciprofloxacin which probably should continue for at least  another 2 weeks. I gave him 10 days worth today 11/22/15 the patient has completed antibiotics. He has an appointment with Dr. Doran Durand this Friday. There is improved  dimensions around the wound on the left fifth metatarsal base 11/29/15; the patient has completed antibiotics last week. Apparently his appointment with Dr. Doran Durand it is not until this Friday. Dimensions are roughly the same. 12/06/15; saw Dr. Doran Durand. No x-ray told the end of the month, next appointment June 30. We have been using Aquacel Ag 12/13/15: No major change this week. Using Aquacel AG 621/17; arrived this week with maceration around the wound. There was quite a bit of undermining which required surgical debridement. I changed him to [facility] last week, by the patient's admission he was up on this more this week 12/27/15; macerated tissue around the wound is removed with a scalpel and pickups. There is no undermining. Nonviable subcutaneous tissue and skin taken from the superior circumference of the wound is slough from the surface. READMISSION 03/06/16 since I last saw this patient at the end of June, he went for surgery on 01/11/16 by Dr. Doran Durand of orthopedics. He had a left foot irrigation and debridement, removal of hardware and placement of wound VAC. He is also been followed by Dr. Megan Salon of infectious disease and completed a six-week course of IV Rocephin for group B strep and Enterobacter in the bone at the time of surgery. Apparently at the time of surgery the bone looked healthy so I don't think any bone was actually removed. He has been using silver alginate based dressings on the same wound area at the base of the left fifth metatarsal on the left. I note that he is also had arterial studies on 01/08/16, these showed a left ABI of 1.2 to and a right ABI of 1.3. Waveforms were listed as biphasic. He was not felt to have any specific arterial issues. 03/13/16; no real change in the condition of the wound at the left lateral foot at roughly the base of his fifth metatarsal. Use silver alginate last week. 03/18/16 arrives today with no open area. Being suspicious of the overlying  callus I. Some of this back although I see nothing but covering tissue here/epithelium. There is no surrounding tenderness READMISSION 04/16/16 this is a patient I discharged about a month ago. Initially a surgical wound on the lateral aspect of his left foot which subsequently became infected. The story he is giving today that he went back into his own modified shoe started to notice pain 2 weeks ago he was seen in Dr. Nona Dell office by a physician assistant last Wednesday and according the patient was told that everything looked fine however this is clearly now broken open and he has an open wound in the same spot that we have been dealing with repetitively. Situation is complicated by the fact that he is running short of money on long-term disability. He has not taken his insulin and at least 2 weeks was previously on NovoLog short acting insulin on a sliding scale and TRESIBA 35 units at bedtime. He is no longer able to afford any of his medication he was in the x-ray on 10/15. A plain x-ray showed healed fracture of the left fifth metatarsal bone status post removal of the associated plate and screw fixation hardware. There was no acute appearing osseous abnormality. His blood work showed a white count of 9.2 with an essentially normal differential comprehensive metabolic panel was normal. Previous CT scan of the foot  on 01/08/16 showed no osteomyelitis previous vascular workup showed no evidence of significant PAD on 01/13/16 04/23/16; culture I did last week grew enterococcus [ampicillin sensitive] and MRSA. He saw infectious disease yesterday. They stop the clindamycin and ordered an MRI. This is not unreasonable. All the hardware is out of the foot at this point. 04/30/16 at this point in time we are still awaiting the results of the MRI at this point in time. Patient did have an area which appears to be somewhat macerated in the proximal portion of the wound where there is overlying necrotic  skin that is doing nothing more than trapping fluid [name], [name]. ([id number]) underneath. He continues to state that he does have some discomfort and again the concern is for the possibility of osteomyelitis hence the reason for the MRI order. We have been using a silver alginate dressing but again I think the reason this with his macerated as it was is that the dressing obscene could not reach the entirety of the wound due to some necrotic skin covering the proximal portion. No bleeding noted at this point in time on initial evaluation. 05/07/16 we receive the results of patient's MRI which shows that he has no evidence of osteomyelitis. This obviously is excellent news. Patient was definitely happy to hear this. He tells me the wound appears to be doing very well at this point in time and is pleased with the progress currently. 05/15/16; as noted the patient did not have osteomyelitis. He has been released by infectious disease and orthopedics. His wound is still open he had a debridement last week but complain when he got home he "bleeding for half a day". He is not had any pain. We have been using silver alginate with Kerlix gauze wrap. 05/28/16; patient arrives today with the wound in much the same condition as last time. He has a small opening on the lateral aspect of his foot however with debridement there is clear undermining medially there is no real evidence of infection here and I didn't see any point in culturing this. One would have to wonder if this isn't a simple matter of in adequate offloading as he is been using a healing sandal. 06/05/16; the open area is now on the plantar aspect of his foot and not decide. The wound almost appears to have "migrated". This was the term use by our intake nurse. 06/12/16; open area on the plantar aspect of his foot. Base of the wound looks very healthy. This will be his second week in a total contact cast 06/19/16; patient arrived today in a  total contact cast. There was some expectation from our staff and myself that this area would be healed. Unfortunately the area was boggy and with rec pressure a fairly substantial amount of purulent drainage was obtained. Specimen obtained for culture. The patient had no complaints of systemic problems including fever or chills or instability of his diabetes. There was no pain in the foot. Nevertheless a extensive debridement was required. 06/26/16; patient's culture from the abscess last week grew a combination of MRSA and ampicillin sensitive enterococcus. I had him on Augmentin and Septra however I have elected to give him a full 10 day course of Zyvox instead as I Recently treated this combination of organisms with Augmentin and Septra before. Arrives today with no systemic symptoms 07/03/16; the patient has 2 more treatments of Zyvox and then he is finished antibiotics the wound has improved now mostly on the lateral aspect of  his foot. There is still some tenderness when he walks. 07/10/16; patient arrives today with Zyvox completed. He only has a small open area remaining. 07/24/16; she arrives here with no overt open area. The covering is thick callus/eschar. Nevertheless there is no open area here. He has some tenderness underneath the area but no overt infection is observed no drainage. The patient has a deformity in the foot with this area weekly to be exposed to more pressure in his foot where nevertheless that something we are going to have to deal with going forward. The patient has diabetic workboots and diabetic shoes. He has had a long difficult course with the area here. This started as a fracture at work. He had bone grafting from his calcaneus and screws. This got infected he had to have more surgery on the area. Bone at the time of that surgery I think showed enterococcus and group B strep. He had 6 weeks of Rocephin. Since then the area has waxed and waned in its difficulty.  Recently he had an MRI in December that did not show osteomyelitis nevertheless he had an abscess that grew MRSA and enterococcus which I elected to treat with Zyvox. This was in December and this wound is actually "healed" over 08/21/16; the patient came in today for his one-month follow-up visit. The area on the lateral aspect of his left foot looks much the same as some month ago. There is no evidence of an open wound here. However the patient tells me about a week after he went back to work he developed severe pain and swelling in the plantar aspect of his right foot first and fifth metatarsal heads. 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 He went to see his podiatrist at [facility]. She told him that he could not work on his feet. She told him to go back and his cam walking boot on the left. Not have open wounds obviously on the right. The patient is actually gone ahead and retired from his job because he does not feel he can work on his feet 09/18/16; patient comes back in saying he recently had some pain on the lateral aspect of his left foot. Asked that we look at this. Other than that all of his wounds have a viable surface. He has diabetic shoes. He is retired from work. READMISSION [name] is a 61 year old man who we cared for for a prolonged period of time in late 2017 into March 2018. At that point he had suffered a fracture of his left fifth metatarsal at about the level of the base of the fifth metatarsal. He had surgical repair by Dr. Meryl Dare and he developed a very refractory wound over the fifth metatarsal. Ultimately this became infected he required hardware removal this eventually closed over and we discharged from the clinic in March of this year. He tells me as well until roughly 3 weeks ago when he developed increasing pain in the same area making it difficult for him to sleep. He was  admitted to hospital from 9/5 through 9/7 with now an ulcer in the same area on the lateral aspect of his left foot at the level of the base of the metatarsal. X-rays and MRIs during this hospitalization were negative. Wound culture showed a combination of Escherichia coli, Morganella and Pseudomonas. He was given IV Cipro and vancomycin the hospital and then discharged home  on Cipro and Doxy. He returned to the ER on 9/16 with leg swelling and discomfort. I don't think anything was added at that point although he did have an ultrasound of the left leg that was negative for DVT. He was back in the ER on 10/4 again with pain in the area he was given Bactrim but states he had a significant allergic reaction to that which has abated once he stopped the Bactrim. I don't think he had a full course. He went to see his podiatrist yesterday who recommended some form of foot soak. He has a Darco forefoot offloading boot The patient is a type II diabetic on insulin. Tells me his recent hemoglobin A1c was 7. Arterial studies done in July 2017 showed an ABI in the right of 1.3 on the left 1.2 to biphasic waveforms bilaterally. He was not felt to have arterial disease. As noted the patient has had 2 MRIs most recently on 10/4 this showed no evidence of an abscess or osteomyelitis and no change since the prior study of 03/05/17 there was nonspecific edema on the dorsum of the foot. ABIs in this clinic today 1.2 which would be unchanged from his study done in July/17 04/15/17; now down to 1 small wound on the left lateral foot. We wrapped him and applied collagen last week and the foot is fairly macerated. 04/22/17; small wound on the left lateral foot however it has some undermining. Using collagen 04/29/17; small wound on the left lateral foot some surrounding callus. Chronic damage in this area as a result of initial fracture nonhealing and secondary infection. 05/06/17; the patient arrives today with the area on  the left lateral foot closed. There is thick subcutaneous tissue with a layer of callus. I took some of the callus off just to ensure adequate closure of the underlying tissue and there is no open wound here. He has considerable deformity of this area of his foot however and unless there is an ability to offload this I think opening is going to be recurrent. He tells me he has diabetic foot wear although I have not actually seen this Readmission: 09/02/17 on evaluation today patient appears to be doing a little better compared to what he tells me what's going on in regard to his left lateral foot. He was previously seen in the ER this past Saturday it is now Tuesday. On Saturday he actually was treated with antibiotics including Cipro 500 mg two times a day and doxycycline 100 mg two times a day. Subsequently he also did have an x-ray performed of his left foot which revealed increased degenerative changes of the base of the fifth metatarsal since comparison in 2018. There was no radiographic evidence of osteomyelitis however. Patient has previously had an MRI of the left foot which was performed on 04/03/17 in this revealed at that point a soft [name], [name]. ([id number]) tissue ulceration but no evidence of osteomyelitis. Patient has previously undergone testing in regard to his ABI's by Dr. Bridgett Larsson and this showed that he did have ABI was within normal limits which does correspond with ABI's we checked here in the office today. That study was on 01/13/16. With all that being said on evaluation today there does not appear to be any evidence of a true open wound of the left lateral fifth metatarsal region. He does have tenderness noted as well is a little bit of boggy/fluctuance feeling to the area in this region of his foot and there is definitely  a very solid and firm callous noted laterally. With that being said I am not able to find any obvious opening at this point there does appear to be a spot  where there appears to been a opening very recently however patient states he has not noted any drainage from this currently. He does however have discomfort with palpation of this area this is in the 3-4/10 range only with very firm palpation this is not too significant at all. Subsequently he does have a small ulcer at the medial nail bed of the right second toe where he inadvertently pulled off a portion of the toenail softly causing this injury. Otherwise there does not appear to be any evidence of infection in regard to this right second toe. 09/23/17 on evaluation today patient actually appears to be doing well he has no open ulcerations noted at this point. With that being said he did have significant callous noted over the left lateral foot which did require callous pairing today he tolerated this without complication. Readmission: 11/07/17 patient presents today for readmission concerning to ulcers that he has. One on the right plantar fifth metatarsal region and the other on the left plantar fifth metatarsal region. He has previously had an x-ray which was performed on 11/05/17 and revealed that the patient had wounds noted of the base of the fifth metatarsal region without evidence of destruction to the bone to suggest osteomyelitis. Obviously this is excellent news. With that being said he does have a significantly large ulcer that extensively plantar surface of the wound and is concerning for the fact that he continues to walk on this due to the patient telling me that he "has to work" I do believe this has likely led to both the opening of the wound as well as the fact that is not really able to heal very well at this point. He has been seen by his podiatrist and apparently according to the note dated 10/23/17 the patient was provided with Santyl and recommended a dry dressing. No wound culture has been obtained at this point. As stated above he has had returned to work due to financial  reasons and is not able to wear his postop shoes due to the fact that he cannot wear this and work. The wound is stated to have been present for "several weeks" prior to the visit with the podiatrist on 10/23/17. With that being said again at least the good news is there does not appear to be any evidence of osteomyelitis. 11/14/17 on evaluation today patient appears to be doing poorly in regard to his bilateral foot ulcers. Unfortunately he seems to have significant callous with a lot of maceration underlined especially on the right even more so than the left. He has been tolerating the dressing changes without complication. With that being said overall I do think he's gonna have to have a significant debridement today. 11/21/17 on evaluation today patient presents for follow-up concerning his bilateral lateral foot ulcers. He has been tolerating the dressing changes without complication. His wife helps perform these for him. With that being said the patient states that the left lateral foot ulcer actually is continuing to give him trouble as far as discomfort is concerned. With that being said actually appears better. 11/28/17 on evaluation today patient appears to be doing poorly in regard to his left lower extremity ulcer in particular. His right as made some progress on the lateral portion although the plantar portion is actually getting somewhat worse  I think this is likely due to the fact that he continues to work although it hurts and he's been having a lot of issues out of this side and as far as pain is concerned. With that being said my biggest concern on evaluation today is that of the patient having issues with increased pain in the left lower extremity ulcer site especially on the plantar aspect. He also appears to have what seems to be an abscess developing in the dorsal/lateral portion of his foot which I think likely is communicating somewhere internally with the ulcer site. Nonetheless he  has not had any fevers, chills, nausea, or vomiting noted at this time. He has however had fairly significant increase in pain and he was Artie having a lot of pain previous. Readmission: 01-29-2022 upon evaluation today patient presents for initial inspection here in our clinic concerning the current issue which has been present since around Oct 29, 2021. The patient tells me that he has at this point been seen by Dr. Vickki Muff who has been managing this up to this point. He has also been seen by Dr. Lazaro Arms I will outline the findings there shortly as well. With that being said currently according to Dr. Alvera Singh note on 6-20- 2023 the patient has a prescription for Zosyn that was given by infectious disease. Again that was back in June however. There was a hospital encounter for incision and drainage and debridement of the foot in order to clearway necrotic tissue. The patient also underwent vascular evaluation with Dr. Lucky Cowboy. Nonetheless on the postop visit on 12-18-2021 the patient was seeming to be doing quite well based on what Dr. Vickki Muff said but nonetheless since that time is really not made much progress and is concerned about losing his foot. He therefore requested a referral to wound care to be seen and evaluated and see if there is anything we can do to help and aid with getting this to heal. Fortunately there does not appear to be any evidence of active infection locally or systemically at this time which is great news. He tells me that he is currently using Betadine wet-to-dry dressings. He does not note any systemic infections but does have evidence of local infection based on what we are seeing. He has had a recent chest x-ray which showed that he had no evidence of acute cardiopulmonary disease that was performed on 12-12-2021. He also underwent vascular intervention with Dr. 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 Subsequently the patient states that Dr. Lucky Cowboy has mentioned that he would need to go back at some time with Dr. Lucky Cowboy said he would consider a posterior tibial approach for revascularization in the future but at this time because of kidney status and the amount of dye used he did not want to proceed any further at that point. Again that something that would likely be scheduled down the road. Nonetheless right now the patient does have an ABI on the left of 0.90 with a TBI of 0.6-0 but this was decreased compared to previous. His ABI on the right was 1.08. Patient did have as well MRI of the left foot which was performed on 12-12-2021 and this shows that he does have interval resection of the proximal third of the fifth metatarsal and area of osteomyelitis on prior  MRI. There is persistent ulceration of plantar lateral aspect at the resection site with a sinus tract extending towards the adjacent cuboid bone findings suspicious for osteomyelitis of the plantar lateral aspect of the cuboid and adjacent base of the fourth metatarsal possibly in the proximal most aspect of the remaining fifth metatarsal shaft as well. The ABI performed on 12-13-2021 showed that the left ABI was 0.83 which had previously been registered at 1.35 and the right ABI was 1.20. I think this is consistent with being able to heal but obviously would do better with vascular intervention not something that is going to be looked into in the future. This was noted above as well. Based on what I am seeing today I do believe that the patient is definitely a candidate for HBO therapy I think this may be in his best interest as I think he is at the point of being in a limb threatening situation. I do believe that he would benefit from hyperbarics along with continued antibiotics along with good wound care measures as well. 02-04-2022 upon evaluation today patient  appears to be doing still poorly in regard to his foot he is having a lot of drainage and he notes odor as well. With that being said we still have not heard anything back from insurance yet as far as approval for HBO therapy. Subsequently the patient does have evidence of osteomyelitis and I think hyperbarics is definitely going to be something that would be beneficial for him. He has been on antibiotics several times previous but unfortunately this just does not seem to be sustained enough we did do a culture it showed Pseudomonas as one of the primary organisms both organisms would be treated with Levaquin which I think is going to be the best way to go. I discussed that with the patient today and I Georgina Peer send that into the pharmacy. 02-11-2022 upon evaluation today patient appears to be doing well currently in regard to his wound. I do feel like the Levaquin is doing better for [name], [name] ([id number]) him which is great news. Fortunately I do not see any evidence of active infection locally or systemically at this time which is great news. No fevers, chills, nausea, vomiting, or diarrhea. Electronic Signature(s) Signed: 02/11/2022 8:03:00 AM By: Worthy Keeler PA-C Entered By: Worthy Keeler on 02/11/2022 08:03:00 [name] ([id number]) -------------------------------------------------------------------------------- Callus Pairing Details Patient Name: BISHOY, CUPP. Date of Service: 02/11/2022 7:30 AM Medical Record Number: [id number] Patient Account Number: [id number] Date of Birth/Sex: February 19, 1961 (61 y.o. M) Treating RN: Cornell Barman Primary Care Provider: [name] Other Clinician: Referring Provider: [name] Treating Provider/Extender: Skipper Cliche in Treatment: 1 Procedure Performed for: Wound #14 Left,Lateral Foot Performed By: Physician Tommie Sams., PA-C Post Procedure Diagnosis Same as Pre-procedure Electronic Signature(s) Signed: 02/11/2022  5:27:45 PM By: Gretta Cool, BSN, RN, CWS, Kim RN, BSN Entered By: Gretta Cool, BSN, RN, CWS, Kim on 02/11/2022 08:12:11 [name] ([id number]) -------------------------------------------------------------------------------- Physical Exam Details Patient Name: [name], [name]. Date of Service: 02/11/2022 7:30 AM Medical Record Number: [id number] Patient Account Number: [id number] Date of Birth/Sex: 16-Aug-1960 (61 y.o. M) Treating RN: Cornell Barman Primary Care Provider: [name] Other Clinician: Referring Provider: [name] Treating Provider/Extender: Skipper Cliche in Treatment: 1 Constitutional Well-nourished and well-hydrated in no acute distress. Respiratory normal breathing without difficulty. Psychiatric this patient is able to make decisions and demonstrates good insight into disease process. Alert and Oriented x 3. pleasant and cooperative.  Notes Upon inspection patient's wound bed actually showed signs of good granulation and epithelization at this point. Fortunately I do not see any signs of active infection at this time which is great news systemically locally he still has infection of the foot including osteomyelitis we will try to get him in hyperbarics in the meantime we are treating him with Levaquin and this seems to be improving things to some degree. Electronic Signature(s) Signed: 02/11/2022 8:03:31 AM By: Worthy Keeler PA-C Entered By: Worthy Keeler on 02/11/2022 08:03:31 ZYSHAWN, BOHNENKAMP ([id number]) -------------------------------------------------------------------------------- Physician Orders Details Patient Name: ARY, LAVINE. Date of Service: 02/11/2022 7:30 AM Medical Record Number: [id number] Patient Account Number: [id number] Date of Birth/Sex: 12-16-1960 (61 y.o. M) Treating RN: Cornell Barman Primary Care Provider: [name] Other Clinician: Referring Provider: [name] Treating Provider/Extender: Skipper Cliche in Treatment:  1 Verbal / Phone Orders: No Diagnosis Coding ICD-10 Coding Code Description [phone] Chronic multifocal osteomyelitis, left ankle and foot E11.621 Type 2 diabetes mellitus with foot ulcer T81.31XA Disruption of external operation (surgical) wound, not elsewhere classified, initial encounter L97.524 Non-pressure chronic ulcer of other part of left foot with necrosis of bone Follow-up Appointments o Return Appointment in 1 week. Bathing/ Shower/ Hygiene o May shower with wound dressing protected with water repellent cover or cast protector. Edema Control - Lymphedema / Segmental Compressive Device / Other o Elevate, Exercise Daily and Avoid Standing for Long Periods of Time. o Elevate legs to the level of the heart and pump ankles as often as possible o Elevate leg(s) parallel to the floor when sitting. Off-Loading o Open toe surgical shoe - left o Other: - knee scotter Hyperbaric Oxygen Therapy Wound #14 Left,Lateral Foot o Evaluate for HBO Therapy o Indication and location: - Left foot osteomyelitis o If appropriate for treatment, begin HBOT per protocol: o 2.0 ATA for 90 Minutes without Air Breaks o One treatment per day (delivered Monday through Friday unless otherwise specified in Special Instructions below): o Total # of Treatments: - 40 o Antihistamine 30 minutes prior to HBO Treatment, difficulty clearing ears. o Finger stick Blood Glucose Pre- and Post- HBOT Treatment. o Follow Hyperbaric Oxygen Glycemia Protocol Wound Treatment Wound #14 - Foot Wound Laterality: Left, Lateral Cleanser: Byram Ancillary Kit - 15 Day Supply (Generic) 3 x Per Week/30 Days Discharge Instructions: Use supplies as instructed; Kit contains: (15) Saline Bullets; (15) 3x3 Gauze; 15 pr Gloves Cleanser: Soap and Water 3 x Per Week/30 Days Discharge Instructions: Gently cleanse wound with antibacterial soap, rinse and pat dry prior to dressing wounds Cleanser: Wound  Cleanser 3 x Per Week/30 Days Discharge Instructions: Wash your hands with soap and water. Remove old dressing, discard into plastic bag and place into trash. Cleanse the wound with Wound Cleanser prior to applying a clean dressing using gauze sponges, not tissues or cotton balls. Do not scrub or use excessive force. Pat dry using gauze sponges, not tissue or cotton balls. Primary Dressing: Silvercel 4 1/4x 4 1/4 (in/in) (Generic) 3 x Per Week/30 Days Discharge Instructions: Apply Silvercel 4 1/4x 4 1/4 (in/in) as instructed Secondary Dressing: ABD Pad 5x9 (in/in) (Generic) 3 x Per Week/30 Days Discharge Instructions: Cover with ABD pad Secured With: Medipore Tape - 63M Medipore H Soft Cloth Surgical Tape, 2x2 (in/yd) (Generic) 3 x Per Week/30 Days [name], [name]. ([id number]) Secured With: Kerlix Roll Sterile or Non-Sterile 6-ply 4.5x4 (yd/yd) (Generic) 3 x Per Week/30 Days Discharge Instructions: Apply Kerlix as directed GLYCEMIA INTERVENTIONS PROTOCOL PRE-HBO  GLYCEMIA INTERVENTIONS ACTION INTERVENTION Obtain pre-HBO capillary blood glucose (ensure 1 physician order is in chart). A. Notify HBO physician and await physician orders. 2 If result is 70 mg/dl or below: B. If the result meets the hospital definition of a critical result, follow hospital policy. A. Give patient an 8 ounce Glucerna Shake, an 8 ounce Ensure, or 8 ounces of a Glucerna/Ensure equivalent dietary supplement*. B. Wait 30 minutes. If result is 71 mg/dl to 130 mg/dl: C. Retest patientos capillary blood glucose (CBG). D. If result greater than or equal to 110 mg/dl, proceed with HBO. If result less than 110 mg/dl, notify HBO physician and consider holding HBO. If result is 131 mg/dl to 249 mg/dl: A. Proceed with HBO. A. Notify HBO physician and await physician orders. B. It is recommended to hold HBO and do If result is 250 mg/dl or greater: blood/urine ketone testing. C. If the result meets the hospital  definition of a critical result, follow hospital policy. POST-HBO GLYCEMIA INTERVENTIONS ACTION INTERVENTION Obtain post HBO capillary blood glucose (ensure 1 physician order is in chart). A. Notify HBO physician and await physician orders. 2 If result is 70 mg/dl or below: B. If the result meets the hospital definition of a critical result, follow hospital policy. A. Give patient an 8 ounce Glucerna Shake, an 8 ounce Ensure, or 8 ounces of a Glucerna/Ensure equivalent dietary supplement*. B. Wait 15 minutes for symptoms of hypoglycemia (i.[name] nervousness, anxiety, If result is 71 mg/dl to 100 mg/dl: sweating, chills, clamminess, irritability, confusion, tachycardia or dizziness). C. If patient asymptomatic, discharge patient. If patient symptomatic, repeat capillary blood glucose (CBG) and notify HBO physician. If result is 101 mg/dl to 249 mg/dl: A. Discharge patient. A. Notify HBO physician and await physician orders. B. It is recommended to do blood/urine If result is 250 mg/dl or greater: ketone testing. C. If the result meets the hospital definition of a critical result, follow hospital policy. *Juice or candies are NOT equivalent products. If patient refuses the Glucerna or Ensure, please consult the hospital dietitian for an appropriate substitute. Electronic Signature(s) Signed: 02/28/2022 5:40:47 PM By: Worthy Keeler PA-C Previous Signature: 02/11/2022 4:45:05 PM Version By: Worthy Keeler PA-C Previous Signature: 02/11/2022 5:27:45 PM Version By: Gretta Cool, BSN, RN, CWS, Kim RN, BSN Entered By: Worthy Keeler on 02/18/2022 09:51:08 [name], [name] ([id number]) -------------------------------------------------------------------------------- Problem List Details Patient Name: [name], [name]. Date of Service: 02/11/2022 7:30 AM Medical Record Number: [id number] Patient Account Number: [id number] Date of Birth/Sex: December 25, 1960 (61 y.o. M) Treating RN: Cornell Barman Primary Care Provider: [name] Other Clinician: Referring Provider: [name] Treating Provider/Extender: Skipper Cliche in Treatment: 1 Active Problems ICD-10 Encounter Code Description Active Date MDM Diagnosis [phone] Chronic multifocal osteomyelitis, left ankle and foot 01/29/2022 No Yes E11.621 Type 2 diabetes mellitus with foot ulcer 01/29/2022 No Yes L97.524 Non-pressure chronic ulcer of other part of left foot with necrosis of 01/29/2022 No Yes bone T81.31XA Disruption of external operation (surgical) wound, not elsewhere 01/29/2022 No Yes classified, initial encounter Inactive Problems Resolved Problems Electronic Signature(s) Signed: 02/18/2022 9:52:00 AM By: Worthy Keeler PA-C Previous Signature: 02/11/2022 7:51:55 AM Version By: Worthy Keeler PA-C Entered By: Worthy Keeler on 02/18/2022 09:52:00 [name] ([id number]) -------------------------------------------------------------------------------- Progress Note Details Patient Name: [name]. Date of Service: 02/11/2022 7:30 AM Medical Record Number: [id number] Patient Account Number: [id number] Date of Birth/Sex: 10/20/1960 (61 y.o. M) Treating RN: Cornell Barman Primary Care Provider: [name] Other  Clinician: Referring Provider: [name] Treating Provider/Extender: Skipper Cliche in Treatment: 1 Subjective Chief Complaint Information obtained from Patient Left lateral foot ulcer History of Present Illness (HPI) Pleasant 61 year old with history of diabetes (Hgb A1c 10.8 in 2014) and peripheral neuropathy. No PVD. L ABI 1.1. Status post right great toe partial amputation years ago. He was at work and on 10/22/2014, was injured by a cart, and suffered an ulceration to his left anterior calf. He says that it subsequently became infected, and he was treated with a course of antibiotics. He was found on initial exam to have an ulceration on the dorsum of his left third toe. He  was unaware of this and attributes it to pressure from his steel toed boots. More recently he injured his right anterior calf on a cart. Ambulating normally per his baseline. He has been undergoing regular debridements, applying mupirocin cream, and an Ace wrap for edema control. He returns to clinic for follow-up and is without complaints. No pain. No fever or chills. No drainage. 10/25/15; this is a 61 year old man who has type II diabetes with diabetic polyneuropathy. He tells me that he fractured his left fifth metatarsal in June 2016 when he presented with swelling. He does not recall a specific injury. His hemoglobin A1c was apparently too high at the time for consideration of surgery and he was put in some form of offloading. Ultimately he went to surgery in December with an allograft from his calcaneus to this site, plate and screws. He had an x-ray of the foot in March that showed concerns about nonunion. He tells me that in March he had to move and basically moved himself. He was on his foot a lot and then noticed some drainage from an open area. He has been following with his orthopedic surgeon Dr. Doran Durand. He has been applying a felt donut, dry dressing and using his heel healing sandal. 11/01/15; this is a patient I saw last week for the first time. He had a small open wound on the plantar aspect of his left foot at roughly the level of the base of his fifth metatarsal. He had a considerable degree of thickened skin around this wound on the plantar aspect which I thought was from chronic pressure on this area. He tells [place] that he had drainage over the course of the week. No systemic symptoms. 11/08/15; culture last week grew Citrobacter korseri. This should've been sensitive to the Augmentin I gave him. He has seen Dr. Doran Durand who did his initial surgery and according the patient the plan is to give this another month and then the hardware might need to come out of this. This seems like a  reasonable plan. I will adjust his antibiotics to ciprofloxacin which probably should continue for at least another 2 weeks. I gave him 10 days worth today 11/22/15 the patient has completed antibiotics. He has an appointment with Dr. Doran Durand this Friday. There is improved dimensions around the wound on the left fifth metatarsal base 11/29/15; the patient has completed antibiotics last week. Apparently his appointment with Dr. Doran Durand it is not until this Friday. Dimensions are roughly the same. 12/06/15; saw Dr. Doran Durand. No x-ray told the end of the month, next appointment June 30. We have been using Aquacel Ag 12/13/15: No major change this week. Using Aquacel AG 621/17; arrived this week with maceration around the wound. There was quite a bit of undermining which required surgical debridement. I changed him to [facility] last week,  by the patient's admission he was up on this more this week 12/27/15; macerated tissue around the wound is removed with a scalpel and pickups. There is no undermining. Nonviable subcutaneous tissue and skin taken from the superior circumference of the wound is slough from the surface. READMISSION 03/06/16 since I last saw this patient at the end of June, he went for surgery on 01/11/16 by Dr. Doran Durand of orthopedics. He had a left foot irrigation and debridement, removal of hardware and placement of wound VAC. He is also been followed by Dr. Megan Salon of infectious disease and completed a six-week course of IV Rocephin for group B strep and Enterobacter in the bone at the time of surgery. Apparently at the time of surgery the bone looked healthy so I don't think any bone was actually removed. He has been using silver alginate based dressings on the same wound area at the base of the left fifth metatarsal on the left. I note that he is also had arterial studies on 01/08/16, these showed a left ABI of 1.2 to and a right ABI of 1.3. Waveforms were listed as biphasic. He was not felt  to have any specific arterial issues. 03/13/16; no real change in the condition of the wound at the left lateral foot at roughly the base of his fifth metatarsal. Use silver alginate last week. 03/18/16 arrives today with no open area. Being suspicious of the overlying callus I. Some of this back although I see nothing but covering tissue here/epithelium. There is no surrounding tenderness READMISSION 04/16/16 this is a patient I discharged about a month ago. Initially a surgical wound on the lateral aspect of his left foot which subsequently became infected. The story he is giving today that he went back into his own modified shoe started to notice pain 2 weeks ago he was seen in Dr. Nona Dell office by a physician assistant last Wednesday and according the patient was told that everything looked fine however this is clearly now broken open and he has an open wound in the same spot that we have been dealing with repetitively. Situation is complicated by the fact that he is running short of money on long-term disability. He has not taken his insulin and at least 2 weeks was previously on NovoLog short acting insulin on a sliding scale and TRESIBA 35 units at bedtime. He is no longer able to afford any of his medication he was in the x-ray on 10/15. A plain x-ray showed healed fracture of the left fifth metatarsal bone status post removal of the associated plate and screw fixation hardware. There was no acute appearing osseous abnormality. His blood work showed a white count of 9.2 with an essentially normal differential comprehensive metabolic panel was normal. Previous CT scan of the foot on 01/08/16 showed no osteomyelitis previous vascular workup showed no evidence of significant PAD on 01/13/16 [name], [name] ([id number]) 04/23/16; culture I did last week grew enterococcus [ampicillin sensitive] and MRSA. He saw infectious disease yesterday. They stop the clindamycin and ordered an MRI. This is not  unreasonable. All the hardware is out of the foot at this point. 04/30/16 at this point in time we are still awaiting the results of the MRI at this point in time. Patient did have an area which appears to be somewhat macerated in the proximal portion of the wound where there is overlying necrotic skin that is doing nothing more than trapping fluid underneath. He continues to state that he does have some  discomfort and again the concern is for the possibility of osteomyelitis hence the reason for the MRI order. We have been using a silver alginate dressing but again I think the reason this with his macerated as it was is that the dressing obscene could not reach the entirety of the wound due to some necrotic skin covering the proximal portion. No bleeding noted at this point in time on initial evaluation. 05/07/16 we receive the results of patient's MRI which shows that he has no evidence of osteomyelitis. This obviously is excellent news. Patient was definitely happy to hear this. He tells me the wound appears to be doing very well at this point in time and is pleased with the progress currently. 05/15/16; as noted the patient did not have osteomyelitis. He has been released by infectious disease and orthopedics. His wound is still open he had a debridement last week but complain when he got home he "bleeding for half a day". He is not had any pain. We have been using silver alginate with Kerlix gauze wrap. 05/28/16; patient arrives today with the wound in much the same condition as last time. He has a small opening on the lateral aspect of his foot however with debridement there is clear undermining medially there is no real evidence of infection here and I didn't see any point in culturing this. One would have to wonder if this isn't a simple matter of in adequate offloading as he is been using a healing sandal. 06/05/16; the open area is now on the plantar aspect of his foot and not decide. The  wound almost appears to have "migrated". This was the term use by our intake nurse. 06/12/16; open area on the plantar aspect of his foot. Base of the wound looks very healthy. This will be his second week in a total contact cast 06/19/16; patient arrived today in a total contact cast. There was some expectation from our staff and myself that this area would be healed. Unfortunately the area was boggy and with rec pressure a fairly substantial amount of purulent drainage was obtained. Specimen obtained for culture. The patient had no complaints of systemic problems including fever or chills or instability of his diabetes. There was no pain in the foot. Nevertheless a extensive debridement was required. 06/26/16; patient's culture from the abscess last week grew a combination of MRSA and ampicillin sensitive enterococcus. I had him on Augmentin and Septra however I have elected to give him a full 10 day course of Zyvox instead as I Recently treated this combination of organisms with Augmentin and Septra before. Arrives today with no systemic symptoms 07/03/16; the patient has 2 more treatments of Zyvox and then he is finished antibiotics the wound has improved now mostly on the lateral aspect of his foot. There is still some tenderness when he walks. 07/10/16; patient arrives today with Zyvox completed. He only has a small open area remaining. 07/24/16; she arrives here with no overt open area. The covering is thick callus/eschar. Nevertheless there is no open area here. He has some tenderness underneath the area but no overt infection is observed no drainage. The patient has a deformity in the foot with this area weekly to be exposed to more pressure in his foot where nevertheless that something we are going to have to deal with going forward. The patient has diabetic workboots and diabetic shoes. He has had a long difficult course with the area here. This started as a fracture at work. He  had bone  grafting from his calcaneus and screws. This got infected he had to have more surgery on the area. Bone at the time of that surgery I think showed enterococcus and group B strep. He had 6 weeks of Rocephin. Since then the area has waxed and waned in its difficulty. Recently he had an MRI in December that did not show osteomyelitis nevertheless he had an abscess that grew MRSA and enterococcus which I elected to treat with Zyvox. This was in December and this wound is actually "healed" over 08/21/16; the patient came in today for his one-month follow-up visit. The area on the lateral aspect of his left foot looks much the same as some month ago. There is no evidence of an open wound here. However the patient tells me about a week after he went back to work he developed severe pain and swelling in the plantar aspect of his right foot first and fifth metatarsal heads. 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 He went to see his podiatrist at [facility]. She told him that he could not work on his feet. She told him to go back and his cam walking boot on the left. Not have open wounds obviously on the right. The patient is actually gone ahead and retired from his job because he does not feel he can work on his feet 09/18/16; patient comes back in saying he recently had some pain on the lateral aspect of his left foot. Asked that we look at this. Other than that all of his wounds have a viable surface. He has diabetic shoes. He is retired from work. READMISSION [name] is a 61 year old man who we cared for for a prolonged period of time in late 2017 into March 2018. At that point he had suffered a fracture of his left fifth metatarsal at about the level of the base of the fifth metatarsal. He had surgical repair by Dr. Meryl Dare and he developed a very refractory wound over the fifth metatarsal. Ultimately this became  infected he required hardware removal this eventually closed over and we discharged from the clinic in March of this year. He tells me as well until roughly 3 weeks ago when he developed increasing pain in the same area making it difficult for him to sleep. He was admitted to hospital from 9/5 through 9/7 with now an ulcer in the same area on the lateral aspect of his left foot at the level of the base of the metatarsal. X-rays and MRIs during this hospitalization were negative. Wound culture showed a combination of Escherichia coli, Morganella and Pseudomonas. He was given IV Cipro and vancomycin the hospital and then discharged home on Cipro and Doxy. He returned to the ER on 9/16 with leg swelling and discomfort. I don't think anything was added at that point although he did have an ultrasound of the left leg that was negative for DVT. He was back in the ER on 10/4 again with pain in the area he was given Bactrim but states he had a significant allergic reaction to that which has abated once he stopped the Bactrim. I don't think he had a full course. He went to see his podiatrist yesterday who recommended some form of foot soak. He has a Darco forefoot offloading boot The patient is a type II diabetic on insulin. Tells me his recent hemoglobin A1c was 7. Arterial  studies done in July 2017 showed an ABI in the right of 1.3 on the left 1.2 to biphasic waveforms bilaterally. He was not felt to have arterial disease. As noted the patient has had 2 MRIs most recently on 10/4 this showed no evidence of an abscess or osteomyelitis and no change since the prior study of 03/05/17 there was nonspecific edema on the dorsum of the foot. ABIs in this clinic today 1.2 which would be unchanged from his study done in July/17 04/15/17; now down to 1 small wound on the left lateral foot. We wrapped him and applied collagen last week and the foot is fairly macerated. 04/22/17; small wound on the left lateral foot  however it has some undermining. Using collagen 04/29/17; small wound on the left lateral foot some surrounding callus. Chronic damage in this area as a result of initial fracture nonhealing and secondary infection. 05/06/17; the patient arrives today with the area on the left lateral foot closed. There is thick subcutaneous tissue with a layer of callus. I took some of the callus off just to ensure adequate closure of the underlying tissue and there is no open wound here. He has considerable deformity of this area of his foot however and unless there is an ability to offload this I think opening is going to be recurrent. He tells me he has diabetic foot wear although I have not actually seen this Readmission: [name], [name] ([id number]) 09/02/17 on evaluation today patient appears to be doing a little better compared to what he tells me what's going on in regard to his left lateral foot. He was previously seen in the ER this past Saturday it is now Tuesday. On Saturday he actually was treated with antibiotics including Cipro 500 mg two times a day and doxycycline 100 mg two times a day. Subsequently he also did have an x-ray performed of his left foot which revealed increased degenerative changes of the base of the fifth metatarsal since comparison in 2018. There was no radiographic evidence of osteomyelitis however. Patient has previously had an MRI of the left foot which was performed on 04/03/17 in this revealed at that point a soft tissue ulceration but no evidence of osteomyelitis. Patient has previously undergone testing in regard to his ABI's by Dr. Bridgett Larsson and this showed that he did have ABI was within normal limits which does correspond with ABI's we checked here in the office today. That study was on 01/13/16. With all that being said on evaluation today there does not appear to be any evidence of a true open wound of the left lateral fifth metatarsal region. He does have tenderness noted as  well is a little bit of boggy/fluctuance feeling to the area in this region of his foot and there is definitely a very solid and firm callous noted laterally. With that being said I am not able to find any obvious opening at this point there does appear to be a spot where there appears to been a opening very recently however patient states he has not noted any drainage from this currently. He does however have discomfort with palpation of this area this is in the 3-4/10 range only with very firm palpation this is not too significant at all. Subsequently he does have a small ulcer at the medial nail bed of the right second toe where he inadvertently pulled off a portion of the toenail softly causing this injury. Otherwise there does not appear to be any evidence of infection  in regard to this right second toe. 09/23/17 on evaluation today patient actually appears to be doing well he has no open ulcerations noted at this point. With that being said he did have significant callous noted over the left lateral foot which did require callous pairing today he tolerated this without complication. Readmission: 11/07/17 patient presents today for readmission concerning to ulcers that he has. One on the right plantar fifth metatarsal region and the other on the left plantar fifth metatarsal region. He has previously had an x-ray which was performed on 11/05/17 and revealed that the patient had wounds noted of the base of the fifth metatarsal region without evidence of destruction to the bone to suggest osteomyelitis. Obviously this is excellent news. With that being said he does have a significantly large ulcer that extensively plantar surface of the wound and is concerning for the fact that he continues to walk on this due to the patient telling me that he "has to work" I do believe this has likely led to both the opening of the wound as well as the fact that is not really able to heal very well at this point. He  has been seen by his podiatrist and apparently according to the note dated 10/23/17 the patient was provided with Santyl and recommended a dry dressing. No wound culture has been obtained at this point. As stated above he has had returned to work due to financial reasons and is not able to wear his postop shoes due to the fact that he cannot wear this and work. The wound is stated to have been present for "several weeks" prior to the visit with the podiatrist on 10/23/17. With that being said again at least the good news is there does not appear to be any evidence of osteomyelitis. 11/14/17 on evaluation today patient appears to be doing poorly in regard to his bilateral foot ulcers. Unfortunately he seems to have significant callous with a lot of maceration underlined especially on the right even more so than the left. He has been tolerating the dressing changes without complication. With that being said overall I do think he's gonna have to have a significant debridement today. 11/21/17 on evaluation today patient presents for follow-up concerning his bilateral lateral foot ulcers. He has been tolerating the dressing changes without complication. His wife helps perform these for him. With that being said the patient states that the left lateral foot ulcer actually is continuing to give him trouble as far as discomfort is concerned. With that being said actually appears better. 11/28/17 on evaluation today patient appears to be doing poorly in regard to his left lower extremity ulcer in particular. His right as made some progress on the lateral portion although the plantar portion is actually getting somewhat worse I think this is likely due to the fact that he continues to work although it hurts and he's been having a lot of issues out of this side and as far as pain is concerned. With that being said my biggest concern on evaluation today is that of the patient having issues with increased pain in the  left lower extremity ulcer site especially on the plantar aspect. He also appears to have what seems to be an abscess developing in the dorsal/lateral portion of his foot which I think likely is communicating somewhere internally with the ulcer site. Nonetheless he has not had any fevers, chills, nausea, or vomiting noted at this time. He has however had fairly significant increase in  pain and he was Artie having a lot of pain previous. Readmission: 01-29-2022 upon evaluation today patient presents for initial inspection here in our clinic concerning the current issue which has been present since around Oct 29, 2021. The patient tells me that he has at this point been seen by Dr. Vickki Muff who has been managing this up to this point. He has also been seen by Dr. Lazaro Arms I will outline the findings there shortly as well. With that being said currently according to Dr. Alvera Singh note on 6-20- 2023 the patient has a prescription for Zosyn that was given by infectious disease. Again that was back in June however. There was a hospital encounter for incision and drainage and debridement of the foot in order to clearway necrotic tissue. The patient also underwent vascular evaluation with Dr. Lucky Cowboy. Nonetheless on the postop visit on 12-18-2021 the patient was seeming to be doing quite well based on what Dr. Vickki Muff said but nonetheless since that time is really not made much progress and is concerned about losing his foot. He therefore requested a referral to wound care to be seen and evaluated and see if there is anything we can do to help and aid with getting this to heal. Fortunately there does not appear to be any evidence of active infection locally or systemically at this time which is great news. He tells me that he is currently using Betadine wet-to-dry dressings. He does not note any systemic infections but does have evidence of local infection based on what we are seeing. He has had a recent chest x-ray which  showed that he had no evidence of acute cardiopulmonary disease that was performed on 12-12-2021. He also underwent vascular intervention with Dr. 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 Subsequently the patient states that Dr. Lucky Cowboy has mentioned that he would need to go back at some time with Dr. Lucky Cowboy said he would consider a posterior tibial approach for revascularization in the future but at this time because of kidney status and the amount of dye used he did not want to proceed any further at that point. Again that something that would likely be scheduled down the road. Nonetheless right now the patient does have an ABI on the left of 0.90 with a TBI of 0.6-0 but this was decreased compared to previous. His ABI on the right was 1.08. Patient did have as well MRI of the left foot which was performed on 12-12-2021 and this shows that he does have interval resection of the proximal third of the fifth metatarsal and area of osteomyelitis on prior MRI. There is persistent ulceration of plantar lateral aspect at the resection site with a sinus tract extending towards the adjacent cuboid bone findings suspicious for osteomyelitis of the plantar lateral aspect of the cuboid and adjacent base of the fourth metatarsal possibly in the proximal most aspect of the remaining fifth metatarsal shaft as well. The ABI performed on 12-13-2021 showed that the left ABI was 0.83 which had previously been registered at 1.35 and the right ABI was 1.20. I think this is consistent with being able to heal but obviously would do better with vascular intervention not something that is going to be looked into in the future. This was noted above as well. Based on what I am seeing  today I do believe that the patient is definitely a candidate for HBO therapy I think this may be in  his best interest as I think he is at the point of being in a limb threatening situation. I do believe that he would benefit from hyperbarics along with continued antibiotics along with good wound care measures as well. 02-04-2022 upon evaluation today patient appears to be doing still poorly in regard to his foot he is having a lot of drainage and he notes odor as well. With that being said we still have not heard anything back from insurance yet as far as approval for HBO therapy. Subsequently the patient does have evidence of osteomyelitis and I think hyperbarics is definitely going to be something that would be beneficial for him. He has been on Mcswain, Jentry F. ([id number]) antibiotics several times previous but unfortunately this just does not seem to be sustained enough we did do a culture it showed Pseudomonas as one of the primary organisms both organisms would be treated with Levaquin which I think is going to be the best way to go. I discussed that with the patient today and I Georgina Peer send that into the pharmacy. 02-11-2022 upon evaluation today patient appears to be doing well currently in regard to his wound. I do feel like the Levaquin is doing better for him which is great news. Fortunately I do not see any evidence of active infection locally or systemically at this time which is great news. No fevers, chills, nausea, vomiting, or diarrhea. Objective Constitutional Well-nourished and well-hydrated in no acute distress. Vitals Time Taken: 7:33 AM, Height: 70 in, Weight: 255 lbs, BMI: 36.6, Temperature: 97.8 F, Pulse: 101 bpm, Respiratory Rate: 18 breaths/min, Blood Pressure: 167/82 mmHg. General Notes: Patient states he did not take BP medication this morning. Respiratory normal breathing without difficulty. Psychiatric this patient is able to make decisions and demonstrates good insight into disease process. Alert and Oriented x 3. pleasant and cooperative. General Notes: Upon  inspection patient's wound bed actually showed signs of good granulation and epithelization at this point. Fortunately I do not see any signs of active infection at this time which is great news systemically locally he still has infection of the foot including osteomyelitis we will try to get him in hyperbarics in the meantime we are treating him with Levaquin and this seems to be improving things to some degree. Integumentary (Hair, Skin) Wound #14 status is Open. Original cause of wound was Blister. The date acquired was: 10/29/2021. The wound has been in treatment 1 weeks. The wound is located on the Left,Lateral Foot. The wound measures 4.2cm length x 5.5cm width x 1.5cm depth; 18.143cm^2 area and 27.214cm^3 volume. There is muscle and Fat Layer (Subcutaneous Tissue) exposed. There is no tunneling or undermining noted. There is a medium amount of sanguinous drainage noted. The wound margin is epibole. There is medium (34-66%) red, pink granulation within the wound bed. There is a medium (34-66%) amount of necrotic tissue within the wound bed including Adherent Slough. Assessment Active Problems ICD-10 Chronic multifocal osteomyelitis, left ankle and foot Type 2 diabetes mellitus with foot ulcer Non-pressure chronic ulcer of other part of left foot with necrosis of bone Disruption of external operation (surgical) wound, not elsewhere classified, initial encounter Procedures Wound #14 Pre-procedure diagnosis of Wound #14 is a Diabetic Wound/Ulcer of the Lower Extremity located on the Left,Lateral Foot . An Callus Pairing procedure was performed by Tommie Sams., PA-C. Post procedure Diagnosis Wound #  14: Same as Pre-Procedure Plan MONTRAIL, MEHRER ([id number]) Follow-up Appointments: Return Appointment in 1 week. Bathing/ Shower/ Hygiene: May shower with wound dressing protected with water repellent cover or cast protector. Edema Control - Lymphedema / Segmental Compressive Device /  Other: Elevate, Exercise Daily and Avoid Standing for Long Periods of Time. Elevate legs to the level of the heart and pump ankles as often as possible Elevate leg(s) parallel to the floor when sitting. Off-Loading: Open toe surgical shoe - left Other: - knee scotter Hyperbaric Oxygen Therapy: Wound #14 Left,Lateral Foot: Evaluate for HBO Therapy Indication and location: - Left foot osteomyelitis If appropriate for treatment, begin HBOT per protocol: 2.0 ATA for 90 Minutes without Air Breaks One treatment per day (delivered Monday through Friday unless otherwise specified in Special Instructions below): Total # of Treatments: - 40 Antihistamine 30 minutes prior to HBO Treatment, difficulty clearing ears. Finger stick Blood Glucose Pre- and Post- HBOT Treatment. Follow Hyperbaric Oxygen Glycemia Protocol WOUND #14: - Foot Wound Laterality: Left, Lateral Cleanser: Byram Ancillary Kit - 15 Day Supply (Generic) 3 x Per Week/30 Days Discharge Instructions: Use supplies as instructed; Kit contains: (15) Saline Bullets; (15) 3x3 Gauze; 15 pr Gloves Cleanser: Soap and Water 3 x Per Week/30 Days Discharge Instructions: Gently cleanse wound with antibacterial soap, rinse and pat dry prior to dressing wounds Cleanser: Wound Cleanser 3 x Per Week/30 Days Discharge Instructions: Wash your hands with soap and water. Remove old dressing, discard into plastic bag and place into trash. Cleanse the wound with Wound Cleanser prior to applying a clean dressing using gauze sponges, not tissues or cotton balls. Do not scrub or use excessive force. Pat dry using gauze sponges, not tissue or cotton balls. Primary Dressing: Silvercel 4 1/4x 4 1/4 (in/in) (Generic) 3 x Per Week/30 Days Discharge Instructions: Apply Silvercel 4 1/4x 4 1/4 (in/in) as instructed Secondary Dressing: ABD Pad 5x9 (in/in) (Generic) 3 x Per Week/30 Days Discharge Instructions: Cover with ABD pad Secured With: Medipore Tape - 85M  Medipore H Soft Cloth Surgical Tape, 2x2 (in/yd) (Generic) 3 x Per Week/30 Days Secured With: [employer] Sterile or Non-Sterile 6-ply 4.5x4 (yd/yd) (Generic) 3 x Per Week/30 Days Discharge Instructions: Apply Kerlix as directed 1. I would recommend currently that we going continue with the wound care measures as before and the patient is in agreement with plan this includes the use of the silver alginate dressing which I think is doing quite well. 2. Also, continue with ABD pad to cover. 3. I am also can recommend that we continue with roll gauze to secure in place. We will see patient back for reevaluation in 1 week here in the clinic. If anything worsens or changes patient will contact our office for additional recommendations. Electronic Signature(s) Signed: 02/18/2022 9:52:13 AM By: Worthy Keeler PA-C Previous Signature: 02/11/2022 8:03:56 AM Version By: Worthy Keeler PA-C Entered By: Worthy Keeler on 02/18/2022 09:52:12 DELSIN, COPEN ([id number]) -------------------------------------------------------------------------------- SuperBill Details Patient Name: [name]. Date of Service: 02/11/2022 Medical Record Number: [id number] Patient Account Number: [id number] Date of Birth/Sex: May 07, 1961 (61 y.o. M) Treating RN: Cornell Barman Primary Care Provider: [name] Other Clinician: Referring Provider: [name] Treating Provider/Extender: Skipper Cliche in Treatment: 1 Diagnosis Coding ICD-10 Codes Code Description [phone] Chronic multifocal osteomyelitis, left ankle and foot E11.621 Type 2 diabetes mellitus with foot ulcer T81.31XA Disruption of external operation (surgical) wound, not elsewhere classified, initial encounter L97.524 Non-pressure chronic ulcer of other part of left  foot with necrosis of bone Facility Procedures CPT4 Code: [id number] Description: [phone] - DEBRIDE WOUND 1ST 20 SQ CM OR < Modifier: Quantity: 1 CPT4 Code: Description: ICD-10  Diagnosis Description L97.524 Non-pressure chronic ulcer of other part of left foot with necrosis of bon Modifier: [name] Quantity: Physician Procedures CPT4 Code: [id number] Description: 74255 - WC PHYS DEBR WO ANESTH 20 SQ CM Modifier: Quantity: 1 CPT4 Code: Description: ICD-10 Diagnosis Description L97.524 Non-pressure chronic ulcer of other part of left foot with necrosis of bon Modifier: [name] Quantity: Electronic Signature(s) Signed: 02/11/2022 8:04:08 AM By: Worthy Keeler PA-C Entered By: Worthy Keeler on 02/11/2022 08:04:07

## 2022-02-15 ENCOUNTER — Encounter: Payer: 59 | Admitting: Physician Assistant

## 2022-02-18 ENCOUNTER — Encounter: Payer: 59 | Admitting: Physician Assistant

## 2022-02-18 DIAGNOSIS — M86372 Chronic multifocal osteomyelitis, left ankle and foot: Secondary | ICD-10-CM | POA: Diagnosis not present

## 2022-02-18 LAB — GLUCOSE, CAPILLARY
Glucose-Capillary: 96 mg/dL (ref 70–99)
Glucose-Capillary: 164 mg/dL — ABNORMAL HIGH (ref 70–99)
Glucose-Capillary: 234 mg/dL — ABNORMAL HIGH (ref 70–99)

## 2022-02-18 NOTE — Progress Notes (Signed)
[name], [name] ([id number]) Visit Report for 02/18/2022 Arrival Information Details Patient Name: [name], [name]. Date of Service: 02/18/2022 8:00 AM Medical Record Number: [id number] Patient Account Number: [id number] Date of Birth/Sex: Jan 28, 1961 (61 y.o. M) Treating RN: Carlene Coria Primary Care Damany Eastman: [name] Other Clinician: [name] Referring Reneka Nebergall: [name] Treating Shelbia Scinto/Extender: Skipper Cliche in Treatment: 2 Visit Information History Since Last Visit Added or deleted any medications: No Patient Arrived: Ambulatory Any new allergies or adverse reactions: No Arrival Time: 08:05 Had a fall or experienced change in No Accompanied By: self activities of daily living that may affect Transfer Assistance: None risk of falls: Patient Identification Verified: Yes Signs or symptoms of abuse/neglect since last visito No Secondary Verification Process Completed: Yes Hospitalized since last visit: No Patient Requires Transmission-Based Precautions: No Implantable device outside of the clinic excluding No Patient Has Alerts: No cellular tissue based products placed in the center since last visit: Has Dressing in Place as Prescribed: Yes Pain Present Now: No Electronic Signature(s) Signed: 02/18/2022 11:40:58 AM By: [name] RCP, RRT, CHT Entered By: [name] on 02/18/2022 09:39:47 [name] ([id number]) -------------------------------------------------------------------------------- Encounter Discharge Information Details Patient Name: [name]. Date of Service: 02/18/2022 8:00 AM Medical Record Number: [id number] Patient Account Number: [id number] Date of Birth/Sex: Jul 17, 1960 (61 y.o. M) Treating RN: Carlene Coria Primary Care Alban Marucci: [name] Other Clinician: [name] Referring Xiomar Crompton: [name] Treating Aneesha Holloran/Extender: Skipper Cliche in Treatment: 2 Encounter Discharge  Information Items Discharge Condition: Stable Ambulatory Status: Ambulatory Discharge Destination: Home Transportation: Private Auto Accompanied By: self Schedule Follow-up Appointment: Yes Clinical Summary of Care: Notes Patient has an HBO treatment scheduled on 02/19/22 at 08:00 am. Electronic Signature(s) Signed: 02/18/2022 11:40:58 AM By: [name] RCP, RRT, CHT Entered By: [name] on 02/18/2022 11:36:43 [name] ([id number]) -------------------------------------------------------------------------------- Vitals Details Patient Name: [name]. Date of Service: 02/18/2022 8:00 AM Medical Record Number: [id number] Patient Account Number: [id number] Date of Birth/Sex: Sep 22, 1960 (61 y.o. M) Treating RN: Carlene Coria Primary Care Kesley Mullens: [name] Other Clinician: [name] Referring Coraline Talwar: [name] Treating Daryl Beehler/Extender: Skipper Cliche in Treatment: 2 Vital Signs Time Taken: 08:09 Temperature (F): 98.6 Height (in): 70 Pulse (bpm): 72 Weight (lbs): 255 Respiratory Rate (breaths/min): 18 Body Mass Index (BMI): 36.6 Blood Pressure (mmHg): 130/62 Capillary Blood Glucose (mg/dl): 96 Reference Range: 80 - 120 mg / dl Electronic Signature(s) Signed: 02/18/2022 11:40:58 AM By: [name] RCP, RRT, CHT Entered By: [name] on 02/18/2022 09:40:43

## 2022-02-19 ENCOUNTER — Encounter: Payer: 59 | Admitting: Physician Assistant

## 2022-02-19 DIAGNOSIS — M86372 Chronic multifocal osteomyelitis, left ankle and foot: Secondary | ICD-10-CM | POA: Diagnosis not present

## 2022-02-19 LAB — GLUCOSE, CAPILLARY
Glucose-Capillary: 131 mg/dL — ABNORMAL HIGH (ref 70–99)
Glucose-Capillary: 168 mg/dL — ABNORMAL HIGH (ref 70–99)

## 2022-02-19 NOTE — Progress Notes (Signed)
SUHAIB, GUZZO ([id number]) Visit Report for 02/19/2022 Arrival Information Details Patient Name: SUTTON, HIRSCH. Date of Service: 02/19/2022 7:30 AM Medical Record Number: [id number] Patient Account Number: [id number] Date of Birth/Sex: 12/06/1960 (61 y.o. M) Treating RN: Carlene Coria Primary Care Malva Diesing: [name] Other Clinician: Referring Loyde Orth: [name] Treating Jerric Oyen/Extender: Skipper Cliche in Treatment: 3 Visit Information History Since Last Visit All ordered tests and consults were completed: Yes Patient Arrived: Ambulatory Added or deleted any medications: No Arrival Time: 07:37 Any new allergies or adverse reactions: No Accompanied By: self Had a fall or experienced change in No Transfer Assistance: None activities of daily living that may affect Patient Identification Verified: Yes risk of falls: Secondary Verification Process Completed: Yes Signs or symptoms of abuse/neglect since last visito No Patient Requires Transmission-Based Precautions: No Hospitalized since last visit: No Patient Has Alerts: No Implantable device outside of the clinic excluding No cellular tissue based products placed in the center since last visit: Pain Present Now: No Electronic Signature(s) Unsigned Entered By: Worthy Keeler on 02/19/2022 07:38:41 Signature(s): Date(s): [name] ([id number]) -------------------------------------------------------------------------------- Pain Assessment Details Patient Name: FARAZ, PONCIANO. Date of Service: 02/19/2022 7:30 AM Medical Record Number: [id number] Patient Account Number: [id number] Date of Birth/Sex: 1961/01/14 (61 y.o. M) Treating RN: Carlene Coria Primary Care Smantha Boakye: [name] Other Clinician: Referring Nehemie Casserly: [name] Treating Carrick Rijos/Extender: Skipper Cliche in Treatment: 3 Active Problems Location of Pain Severity and Description of Pain Patient Has Paino No Site Locations  With Dressing Change: No Pain Management and Medication Current Pain Management: Electronic Signature(s) Unsigned Entered By: Worthy Keeler on 02/19/2022 07:40:56 Signature(s): Date(s): [name] ([id number]) -------------------------------------------------------------------------------- Vitals Details Patient Name: [name]. Date of Service: 02/19/2022 7:30 AM Medical Record Number: [id number] Patient Account Number: [id number] Date of Birth/Sex: 1961-04-22 (61 y.o. M) Treating RN: Carlene Coria Primary Care Shamarra Warda: [name] Other Clinician: Referring Neah Sporrer: [name] Treating Fady Stamps/Extender: Skipper Cliche in Treatment: 3 Vital Signs Time Taken: 07:35 Temperature (F): 97.8 Height (in): 70 Pulse (bpm): 92 Weight (lbs): 255 Respiratory Rate (breaths/min): 14 Body Mass Index (BMI): 36.6 Blood Pressure (mmHg): 159/85 Reference Range: 80 - 120 mg / dl Electronic Signature(s) Unsigned Entered By: Worthy Keeler on 02/19/2022 07:40:43 Signature(s): Date(s):

## 2022-02-19 NOTE — Progress Notes (Addendum)
DAYMON, HORA ([id number]) Visit Report for 02/19/2022 Chief Complaint Document Details Patient Name: [name], [name]. Date of Service: 02/19/2022 7:30 AM Medical Record Number: [id number] Patient Account Number: [id number] Date of Birth/Sex: Dec 31, 1960 (61 y.o. M) Treating RN: Carlene Coria Primary Care Provider: [name] Other Clinician: Referring Provider: [name] Treating Provider/Extender: Skipper Cliche in Treatment: 3 Information Obtained from: Patient Chief Complaint Left lateral foot ulcer Electronic Signature(s) Signed: 02/19/2022 7:37:00 AM By: Worthy Keeler PA-C Entered By: Worthy Keeler on 02/19/2022 07:37:00 [name], [name] ([id number]) -------------------------------------------------------------------------------- HPI Details Patient Name: [name]. Date of Service: 02/19/2022 7:30 AM Medical Record Number: [id number] Patient Account Number: [id number] Date of Birth/Sex: 06/23/1961 (61 y.o. M) Treating RN: Carlene Coria Primary Care Provider: [name] Other Clinician: Referring Provider: [name] Treating Provider/Extender: Skipper Cliche in Treatment: 3 History of Present Illness HPI Description: Pleasant 61 year old with history of diabetes (Hgb A1c 10.8 in 2014) and peripheral neuropathy. No PVD. L ABI 1.1. Status post right great toe partial amputation years ago. He was at work and on 10/22/2014, was injured by a cart, and suffered an ulceration to his left anterior calf. He says that it subsequently became infected, and he was treated with a course of antibiotics. He was found on initial exam to have an ulceration on the dorsum of his left third toe. He was unaware of this and attributes it to pressure from his steel toed boots. More recently he injured his right anterior calf on a cart. Ambulating normally per his baseline. He has been undergoing regular debridements, applying mupirocin cream, and an Ace wrap for edema  control. He returns to clinic for follow-up and is without complaints. No pain. No fever or chills. No drainage. 10/25/15; this is a 61 year old man who has type II diabetes with diabetic polyneuropathy. He tells me that he fractured his left fifth metatarsal in June 2016 when he presented with swelling. He does not recall a specific injury. His hemoglobin A1c was apparently too high at the time for consideration of surgery and he was put in some form of offloading. Ultimately he went to surgery in December with an allograft from his calcaneus to this site, plate and screws. He had an x-ray of the foot in March that showed concerns about nonunion. He tells me that in March he had to move and basically moved himself. He was on his foot a lot and then noticed some drainage from an open area. He has been following with his orthopedic surgeon Dr. Doran Durand. He has been applying a felt donut, dry dressing and using his heel healing sandal. 11/01/15; this is a patient I saw last week for the first time. He had a small open wound on the plantar aspect of his left foot at roughly the level of the base of his fifth metatarsal. He had a considerable degree of thickened skin around this wound on the plantar aspect which I thought was from chronic pressure on this area. He tells [place] that he had drainage over the course of the week. No systemic symptoms. 11/08/15; culture last week grew Citrobacter korseri. This should've been sensitive to the Augmentin I gave him. He has seen Dr. Doran Durand who did his initial surgery and according the patient the plan is to give this another month and then the hardware might need to come out of this. This seems like a reasonable plan. I will adjust his antibiotics to ciprofloxacin which probably should continue for at least  another 2 weeks. I gave him 10 days worth today 11/22/15 the patient has completed antibiotics. He has an appointment with Dr. Doran Durand this Friday. There is improved  dimensions around the wound on the left fifth metatarsal base 11/29/15; the patient has completed antibiotics last week. Apparently his appointment with Dr. Doran Durand it is not until this Friday. Dimensions are roughly the same. 12/06/15; saw Dr. Doran Durand. No x-ray told the end of the month, next appointment June 30. We have been using Aquacel Ag 12/13/15: No major change this week. Using Aquacel AG 621/17; arrived this week with maceration around the wound. There was quite a bit of undermining which required surgical debridement. I changed him to [facility] last week, by the patient's admission he was up on this more this week 12/27/15; macerated tissue around the wound is removed with a scalpel and pickups. There is no undermining. Nonviable subcutaneous tissue and skin taken from the superior circumference of the wound is slough from the surface. READMISSION 03/06/16 since I last saw this patient at the end of June, he went for surgery on 01/11/16 by Dr. Doran Durand of orthopedics. He had a left foot irrigation and debridement, removal of hardware and placement of wound VAC. He is also been followed by Dr. Megan Salon of infectious disease and completed a six-week course of IV Rocephin for group B strep and Enterobacter in the bone at the time of surgery. Apparently at the time of surgery the bone looked healthy so I don't think any bone was actually removed. He has been using silver alginate based dressings on the same wound area at the base of the left fifth metatarsal on the left. I note that he is also had arterial studies on 01/08/16, these showed a left ABI of 1.2 to and a right ABI of 1.3. Waveforms were listed as biphasic. He was not felt to have any specific arterial issues. 03/13/16; no real change in the condition of the wound at the left lateral foot at roughly the base of his fifth metatarsal. Use silver alginate last week. 03/18/16 arrives today with no open area. Being suspicious of the overlying  callus I. Some of this back although I see nothing but covering tissue here/epithelium. There is no surrounding tenderness READMISSION 04/16/16 this is a patient I discharged about a month ago. Initially a surgical wound on the lateral aspect of his left foot which subsequently became infected. The story he is giving today that he went back into his own modified shoe started to notice pain 2 weeks ago he was seen in Dr. Nona Dell office by a physician assistant last Wednesday and according the patient was told that everything looked fine however this is clearly now broken open and he has an open wound in the same spot that we have been dealing with repetitively. Situation is complicated by the fact that he is running short of money on long-term disability. He has not taken his insulin and at least 2 weeks was previously on NovoLog short acting insulin on a sliding scale and TRESIBA 35 units at bedtime. He is no longer able to afford any of his medication he was in the x-ray on 10/15. A plain x-ray showed healed fracture of the left fifth metatarsal bone status post removal of the associated plate and screw fixation hardware. There was no acute appearing osseous abnormality. His blood work showed a white count of 9.2 with an essentially normal differential comprehensive metabolic panel was normal. Previous CT scan of the foot  on 01/08/16 showed no osteomyelitis previous vascular workup showed no evidence of significant PAD on 01/13/16 04/23/16; culture I did last week grew enterococcus [ampicillin sensitive] and MRSA. He saw infectious disease yesterday. They stop the clindamycin and ordered an MRI. This is not unreasonable. All the hardware is out of the foot at this point. 04/30/16 at this point in time we are still awaiting the results of the MRI at this point in time. Patient did have an area which appears to be somewhat macerated in the proximal portion of the wound where there is overlying necrotic  skin that is doing nothing more than trapping fluid [name], [name]. ([id number]) underneath. He continues to state that he does have some discomfort and again the concern is for the possibility of osteomyelitis hence the reason for the MRI order. We have been using a silver alginate dressing but again I think the reason this with his macerated as it was is that the dressing obscene could not reach the entirety of the wound due to some necrotic skin covering the proximal portion. No bleeding noted at this point in time on initial evaluation. 05/07/16 we receive the results of patient's MRI which shows that he has no evidence of osteomyelitis. This obviously is excellent news. Patient was definitely happy to hear this. He tells me the wound appears to be doing very well at this point in time and is pleased with the progress currently. 05/15/16; as noted the patient did not have osteomyelitis. He has been released by infectious disease and orthopedics. His wound is still open he had a debridement last week but complain when he got home he "bleeding for half a day". He is not had any pain. We have been using silver alginate with Kerlix gauze wrap. 05/28/16; patient arrives today with the wound in much the same condition as last time. He has a small opening on the lateral aspect of his foot however with debridement there is clear undermining medially there is no real evidence of infection here and I didn't see any point in culturing this. One would have to wonder if this isn't a simple matter of in adequate offloading as he is been using a healing sandal. 06/05/16; the open area is now on the plantar aspect of his foot and not decide. The wound almost appears to have "migrated". This was the term use by our intake nurse. 06/12/16; open area on the plantar aspect of his foot. Base of the wound looks very healthy. This will be his second week in a total contact cast 06/19/16; patient arrived today in a  total contact cast. There was some expectation from our staff and myself that this area would be healed. Unfortunately the area was boggy and with rec pressure a fairly substantial amount of purulent drainage was obtained. Specimen obtained for culture. The patient had no complaints of systemic problems including fever or chills or instability of his diabetes. There was no pain in the foot. Nevertheless a extensive debridement was required. 06/26/16; patient's culture from the abscess last week grew a combination of MRSA and ampicillin sensitive enterococcus. I had him on Augmentin and Septra however I have elected to give him a full 10 day course of Zyvox instead as I Recently treated this combination of organisms with Augmentin and Septra before. Arrives today with no systemic symptoms 07/03/16; the patient has 2 more treatments of Zyvox and then he is finished antibiotics the wound has improved now mostly on the lateral aspect of  his foot. There is still some tenderness when he walks. 07/10/16; patient arrives today with Zyvox completed. He only has a small open area remaining. 07/24/16; she arrives here with no overt open area. The covering is thick callus/eschar. Nevertheless there is no open area here. He has some tenderness underneath the area but no overt infection is observed no drainage. The patient has a deformity in the foot with this area weekly to be exposed to more pressure in his foot where nevertheless that something we are going to have to deal with going forward. The patient has diabetic workboots and diabetic shoes. He has had a long difficult course with the area here. This started as a fracture at work. He had bone grafting from his calcaneus and screws. This got infected he had to have more surgery on the area. Bone at the time of that surgery I think showed enterococcus and group B strep. He had 6 weeks of Rocephin. Since then the area has waxed and waned in its difficulty.  Recently he had an MRI in December that did not show osteomyelitis nevertheless he had an abscess that grew MRSA and enterococcus which I elected to treat with Zyvox. This was in December and this wound is actually "healed" over 08/21/16; the patient came in today for his one-month follow-up visit. The area on the lateral aspect of his left foot looks much the same as some month ago. There is no evidence of an open wound here. However the patient tells me about a week after he went back to work he developed severe pain and swelling in the plantar aspect of his right foot first and fifth metatarsal heads. 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 He went to see his podiatrist at [facility]. She told him that he could not work on his feet. She told him to go back and his cam walking boot on the left. Not have open wounds obviously on the right. The patient is actually gone ahead and retired from his job because he does not feel he can work on his feet 09/18/16; patient comes back in saying he recently had some pain on the lateral aspect of his left foot. Asked that we look at this. Other than that all of his wounds have a viable surface. He has diabetic shoes. He is retired from work. READMISSION Edrees Valent is a 61 year old man who we cared for for a prolonged period of time in late 2017 into March 2018. At that point he had suffered a fracture of his left fifth metatarsal at about the level of the base of the fifth metatarsal. He had surgical repair by Dr. Meryl Dare and he developed a very refractory wound over the fifth metatarsal. Ultimately this became infected he required hardware removal this eventually closed over and we discharged from the clinic in March of this year. He tells me as well until roughly 3 weeks ago when he developed increasing pain in the same area making it difficult for him to sleep. He was  admitted to hospital from 9/5 through 9/7 with now an ulcer in the same area on the lateral aspect of his left foot at the level of the base of the metatarsal. X-rays and MRIs during this hospitalization were negative. Wound culture showed a combination of Escherichia coli, Morganella and Pseudomonas. He was given IV Cipro and vancomycin the hospital and then discharged home  on Cipro and Doxy. He returned to the ER on 9/16 with leg swelling and discomfort. I don't think anything was added at that point although he did have an ultrasound of the left leg that was negative for DVT. He was back in the ER on 10/4 again with pain in the area he was given Bactrim but states he had a significant allergic reaction to that which has abated once he stopped the Bactrim. I don't think he had a full course. He went to see his podiatrist yesterday who recommended some form of foot soak. He has a Darco forefoot offloading boot The patient is a type II diabetic on insulin. Tells me his recent hemoglobin A1c was 7. Arterial studies done in July 2017 showed an ABI in the right of 1.3 on the left 1.2 to biphasic waveforms bilaterally. He was not felt to have arterial disease. As noted the patient has had 2 MRIs most recently on 10/4 this showed no evidence of an abscess or osteomyelitis and no change since the prior study of 03/05/17 there was nonspecific edema on the dorsum of the foot. ABIs in this clinic today 1.2 which would be unchanged from his study done in July/17 04/15/17; now down to 1 small wound on the left lateral foot. We wrapped him and applied collagen last week and the foot is fairly macerated. 04/22/17; small wound on the left lateral foot however it has some undermining. Using collagen 04/29/17; small wound on the left lateral foot some surrounding callus. Chronic damage in this area as a result of initial fracture nonhealing and secondary infection. 05/06/17; the patient arrives today with the area on  the left lateral foot closed. There is thick subcutaneous tissue with a layer of callus. I took some of the callus off just to ensure adequate closure of the underlying tissue and there is no open wound here. He has considerable deformity of this area of his foot however and unless there is an ability to offload this I think opening is going to be recurrent. He tells me he has diabetic foot wear although I have not actually seen this Readmission: 09/02/17 on evaluation today patient appears to be doing a little better compared to what he tells me what's going on in regard to his left lateral foot. He was previously seen in the ER this past Saturday it is now Tuesday. On Saturday he actually was treated with antibiotics including Cipro 500 mg two times a day and doxycycline 100 mg two times a day. Subsequently he also did have an x-ray performed of his left foot which revealed increased degenerative changes of the base of the fifth metatarsal since comparison in 2018. There was no radiographic evidence of osteomyelitis however. Patient has previously had an MRI of the left foot which was performed on 04/03/17 in this revealed at that point a soft [name], [name]. ([id number]) tissue ulceration but no evidence of osteomyelitis. Patient has previously undergone testing in regard to his ABI's by Dr. Bridgett Larsson and this showed that he did have ABI was within normal limits which does correspond with ABI's we checked here in the office today. That study was on 01/13/16. With all that being said on evaluation today there does not appear to be any evidence of a true open wound of the left lateral fifth metatarsal region. He does have tenderness noted as well is a little bit of boggy/fluctuance feeling to the area in this region of his foot and there is definitely  a very solid and firm callous noted laterally. With that being said I am not able to find any obvious opening at this point there does appear to be a spot  where there appears to been a opening very recently however patient states he has not noted any drainage from this currently. He does however have discomfort with palpation of this area this is in the 3-4/10 range only with very firm palpation this is not too significant at all. Subsequently he does have a small ulcer at the medial nail bed of the right second toe where he inadvertently pulled off a portion of the toenail softly causing this injury. Otherwise there does not appear to be any evidence of infection in regard to this right second toe. 09/23/17 on evaluation today patient actually appears to be doing well he has no open ulcerations noted at this point. With that being said he did have significant callous noted over the left lateral foot which did require callous pairing today he tolerated this without complication. Readmission: 11/07/17 patient presents today for readmission concerning to ulcers that he has. One on the right plantar fifth metatarsal region and the other on the left plantar fifth metatarsal region. He has previously had an x-ray which was performed on 11/05/17 and revealed that the patient had wounds noted of the base of the fifth metatarsal region without evidence of destruction to the bone to suggest osteomyelitis. Obviously this is excellent news. With that being said he does have a significantly large ulcer that extensively plantar surface of the wound and is concerning for the fact that he continues to walk on this due to the patient telling me that he "has to work" I do believe this has likely led to both the opening of the wound as well as the fact that is not really able to heal very well at this point. He has been seen by his podiatrist and apparently according to the note dated 10/23/17 the patient was provided with Santyl and recommended a dry dressing. No wound culture has been obtained at this point. As stated above he has had returned to work due to financial  reasons and is not able to wear his postop shoes due to the fact that he cannot wear this and work. The wound is stated to have been present for "several weeks" prior to the visit with the podiatrist on 10/23/17. With that being said again at least the good news is there does not appear to be any evidence of osteomyelitis. 11/14/17 on evaluation today patient appears to be doing poorly in regard to his bilateral foot ulcers. Unfortunately he seems to have significant callous with a lot of maceration underlined especially on the right even more so than the left. He has been tolerating the dressing changes without complication. With that being said overall I do think he's gonna have to have a significant debridement today. 11/21/17 on evaluation today patient presents for follow-up concerning his bilateral lateral foot ulcers. He has been tolerating the dressing changes without complication. His wife helps perform these for him. With that being said the patient states that the left lateral foot ulcer actually is continuing to give him trouble as far as discomfort is concerned. With that being said actually appears better. 11/28/17 on evaluation today patient appears to be doing poorly in regard to his left lower extremity ulcer in particular. His right as made some progress on the lateral portion although the plantar portion is actually getting somewhat worse  I think this is likely due to the fact that he continues to work although it hurts and he's been having a lot of issues out of this side and as far as pain is concerned. With that being said my biggest concern on evaluation today is that of the patient having issues with increased pain in the left lower extremity ulcer site especially on the plantar aspect. He also appears to have what seems to be an abscess developing in the dorsal/lateral portion of his foot which I think likely is communicating somewhere internally with the ulcer site. Nonetheless he  has not had any fevers, chills, nausea, or vomiting noted at this time. He has however had fairly significant increase in pain and he was Artie having a lot of pain previous. Readmission: 01-29-2022 upon evaluation today patient presents for initial inspection here in our clinic concerning the current issue which has been present since around Oct 29, 2021. The patient tells me that he has at this point been seen by Dr. Vickki Muff who has been managing this up to this point. He has also been seen by Dr. Lazaro Arms I will outline the findings there shortly as well. With that being said currently according to Dr. Alvera Singh note on 6-20- 2023 the patient has a prescription for Zosyn that was given by infectious disease. Again that was back in June however. There was a hospital encounter for incision and drainage and debridement of the foot in order to clearway necrotic tissue. The patient also underwent vascular evaluation with Dr. Lucky Cowboy. Nonetheless on the postop visit on 12-18-2021 the patient was seeming to be doing quite well based on what Dr. Vickki Muff said but nonetheless since that time is really not made much progress and is concerned about losing his foot. He therefore requested a referral to wound care to be seen and evaluated and see if there is anything we can do to help and aid with getting this to heal. Fortunately there does not appear to be any evidence of active infection locally or systemically at this time which is great news. He tells me that he is currently using Betadine wet-to-dry dressings. He does not note any systemic infections but does have evidence of local infection based on what we are seeing. He has had a recent chest x-ray which showed that he had no evidence of acute cardiopulmonary disease that was performed on 12-12-2021. He also underwent vascular intervention with Dr. 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 Subsequently the patient states that Dr. Lucky Cowboy has mentioned that he would need to go back at some time with Dr. Lucky Cowboy said he would consider a posterior tibial approach for revascularization in the future but at this time because of kidney status and the amount of dye used he did not want to proceed any further at that point. Again that something that would likely be scheduled down the road. Nonetheless right now the patient does have an ABI on the left of 0.90 with a TBI of 0.6-0 but this was decreased compared to previous. His ABI on the right was 1.08. Patient did have as well MRI of the left foot which was performed on 12-12-2021 and this shows that he does have interval resection of the proximal third of the fifth metatarsal and area of osteomyelitis on prior  MRI. There is persistent ulceration of plantar lateral aspect at the resection site with a sinus tract extending towards the adjacent cuboid bone findings suspicious for osteomyelitis of the plantar lateral aspect of the cuboid and adjacent base of the fourth metatarsal possibly in the proximal most aspect of the remaining fifth metatarsal shaft as well. The ABI performed on 12-13-2021 showed that the left ABI was 0.83 which had previously been registered at 1.35 and the right ABI was 1.20. I think this is consistent with being able to heal but obviously would do better with vascular intervention not something that is going to be looked into in the future. This was noted above as well. Based on what I am seeing today I do believe that the patient is definitely a candidate for HBO therapy I think this may be in his best interest as I think he is at the point of being in a limb threatening situation. I do believe that he would benefit from hyperbarics along with continued antibiotics along with good wound care measures as well. 02-04-2022 upon evaluation today patient  appears to be doing still poorly in regard to his foot he is having a lot of drainage and he notes odor as well. With that being said we still have not heard anything back from insurance yet as far as approval for HBO therapy. Subsequently the patient does have evidence of osteomyelitis and I think hyperbarics is definitely going to be something that would be beneficial for him. He has been on antibiotics several times previous but unfortunately this just does not seem to be sustained enough we did do a culture it showed Pseudomonas as one of the primary organisms both organisms would be treated with Levaquin which I think is going to be the best way to go. I discussed that with the patient today and I Georgina Peer send that into the pharmacy. 02-11-2022 upon evaluation today patient appears to be doing well currently in regard to his wound. I do feel like the Levaquin is doing better for KAISEN, ACKERS ([id number]) him which is great news. Fortunately I do not see any evidence of active infection locally or systemically at this time which is great news. No fevers, chills, nausea, vomiting, or diarrhea. 02-19-2022 upon evaluation today patient appears to be doing excellent in regard to his foot ulcer all things considered he still has bone exposed but the good news is we are at least making some progress here I think he does have a lot of odor he tells me and I think switching over to Dakin's moistened gauze dressing is probably can be the way to go. Electronic Signature(s) Signed: 02/19/2022 9:47:05 AM By: Worthy Keeler PA-C Entered By: Worthy Keeler on 02/19/2022 09:47:05 [name], [name] ([id number]) -------------------------------------------------------------------------------- Physical Exam Details Patient Name: [name], [name]. Date of Service: 02/19/2022 7:30 AM Medical Record Number: [id number] Patient Account Number: [id number] Date of Birth/Sex: [birth year] (62 y.o. M) Treating RN: Carlene Coria Primary Care Provider: [name] Other Clinician: Referring Provider: [name] Treating Provider/Extender: Skipper Cliche in Treatment: 3 Constitutional Well-nourished and well-hydrated in no acute distress. Respiratory normal breathing without difficulty. Psychiatric this patient is able to make decisions and demonstrates good insight into disease process. Alert and Oriented x 3. pleasant and cooperative. Notes Upon inspection patient's wound bed actually showed signs of good granulation epithelization at this point. Fortunately I do not see any evidence of infection locally or systemically which is great news and overall  I do believe that we are on the right track here. Nonetheless I do believe it is crucial for him to keep going with the hyperbarics I think also I would add another antibiotic for him I Georgina Peer send in a prescription for metronidazole which I think may be a good option here. Electronic Signature(s) Signed: 02/19/2022 9:48:11 AM By: Worthy Keeler PA-C Entered By: Worthy Keeler on 02/19/2022 09:48:10 [name] ([id number]) -------------------------------------------------------------------------------- Physician Orders Details Patient Name: [name], [name]. Date of Service: 02/19/2022 7:30 AM Medical Record Number: [id number] Patient Account Number: [id number] Date of Birth/Sex: [birth year] (61 y.o. M) Treating RN: Carlene Coria Primary Care Provider: [name] Other Clinician: Referring Provider: [name] Treating Provider/Extender: Skipper Cliche in Treatment: 3 Verbal / Phone Orders: No Diagnosis Coding ICD-10 Coding Code Description [id number] Chronic multifocal osteomyelitis, left ankle and foot E11.621 Type 2 diabetes mellitus with foot ulcer L97.524 Non-pressure chronic ulcer of other part of left foot with necrosis of bone T81.31XA Disruption of external operation (surgical) wound, not elsewhere classified, initial  encounter Follow-up Appointments o Return Appointment in 1 week. Bathing/ Shower/ Hygiene o May shower with wound dressing protected with water repellent cover or cast protector. Edema Control - Lymphedema / Segmental Compressive Device / Other o Elevate, Exercise Daily and Avoid Standing for Long Periods of Time. o Elevate legs to the level of the heart and pump ankles as often as possible o Elevate leg(s) parallel to the floor when sitting. Off-Loading o Open toe surgical shoe - left o Other: - knee scotter Hyperbaric Oxygen Therapy Wound #14 Left,Lateral Foot o Evaluate for HBO Therapy o Indication and location: - Left foot osteomyelitis o If appropriate for treatment, begin HBOT per protocol: o 2.0 ATA for 90 Minutes without Air Breaks o One treatment per day (delivered Monday through Friday unless otherwise specified in Special Instructions below): o Total # of Treatments: - 40 o Antihistamine 30 minutes prior to HBO Treatment, difficulty clearing ears. o Finger stick Blood Glucose Pre- and Post- HBOT Treatment. o Follow Hyperbaric Oxygen Glycemia Protocol Wound Treatment Wound #14 - Foot Wound Laterality: Left, Lateral Cleanser: Byram Ancillary Kit - 15 Day Supply (Generic) 3 x Per Week/30 Days Discharge Instructions: Use supplies as instructed; Kit contains: (15) Saline Bullets; (15) 3x3 Gauze; 15 pr Gloves Cleanser: Soap and Water 3 x Per Week/30 Days Discharge Instructions: Gently cleanse wound with antibacterial soap, rinse and pat dry prior to dressing wounds Cleanser: Wound Cleanser 3 x Per Week/30 Days Discharge Instructions: Wash your hands with soap and water. Remove old dressing, discard into plastic bag and place into trash. Cleanse the wound with Wound Cleanser prior to applying a clean dressing using gauze sponges, not tissues or cotton balls. Do not scrub or use excessive force. Pat dry using gauze sponges, not tissue or cotton  balls. Primary Dressing: Gauze 3 x Per Week/30 Days Discharge Instructions: moistened with Dakins Solution Secondary Dressing: ABD Pad 5x9 (in/in) (Generic) 3 x Per Week/30 Days Discharge Instructions: Cover with ABD pad Secured With: Medipore Tape - 10M Medipore H Soft Cloth Surgical Tape, 2x2 (in/yd) (Generic) 3 x Per Week/30 Days [name], [name] ([id number]) Secured With: Kerlix Roll Sterile or Non-Sterile 6-ply 4.5x4 (yd/yd) (Generic) 3 x Per Week/30 Days Discharge Instructions: Apply Kerlix as directed Patient Medications Allergies: Bactrim Notifications Medication Indication Start End metronidazole 02/19/2022 DOSE 1 - oral 500 mg tablet - 1 tablet oral taken 3 times per day x 30 days GLYCEMIA INTERVENTIONS PROTOCOL PRE-HBO  GLYCEMIA INTERVENTIONS ACTION INTERVENTION Obtain pre-HBO capillary blood glucose (ensure 1 physician order is in chart). A. Notify HBO physician and await physician orders. 2 If result is 70 mg/dl or below: B. If the result meets the hospital definition of a critical result, follow hospital policy. A. Give patient an 8 ounce Glucerna Shake, an 8 ounce Ensure, or 8 ounces of a Glucerna/Ensure equivalent dietary supplement*. B. Wait 30 minutes. If result is 71 mg/dl to 130 mg/dl: C. Retest patientos capillary blood glucose (CBG). D. If result greater than or equal to 110 mg/dl, proceed with HBO. If result less than 110 mg/dl, notify HBO physician and consider holding HBO. If result is 131 mg/dl to 249 mg/dl: A. Proceed with HBO. A. Notify HBO physician and await physician orders. B. It is recommended to hold HBO and do If result is 250 mg/dl or greater: blood/urine ketone testing. C. If the result meets the hospital definition of a critical result, follow hospital policy. POST-HBO GLYCEMIA INTERVENTIONS ACTION INTERVENTION Obtain post HBO capillary blood glucose (ensure 1 physician order is in chart). A. Notify HBO physician and await  physician orders. 2 If result is 70 mg/dl or below: B. If the result meets the hospital definition of a critical result, follow hospital policy. A. Give patient an 8 ounce Glucerna Shake, an 8 ounce Ensure, or 8 ounces of a Glucerna/Ensure equivalent dietary supplement*. B. Wait 15 minutes for symptoms of hypoglycemia (i.e. nervousness, anxiety, If result is 71 mg/dl to 100 mg/dl: sweating, chills, clamminess, irritability, confusion, tachycardia or dizziness). C. If patient asymptomatic, discharge patient. If patient symptomatic, repeat capillary blood glucose (CBG) and notify HBO physician. If result is 101 mg/dl to 249 mg/dl: A. Discharge patient. A. Notify HBO physician and await physician orders. B. It is recommended to do blood/urine If result is 250 mg/dl or greater: ketone testing. C. If the result meets the hospital definition of a critical result, follow hospital policy. *Juice or candies are NOT equivalent products. If patient refuses the Glucerna or Ensure, please consult the hospital dietitian for an appropriate substitute. BRYEN, HINDERMAN ([id number]) Electronic Signature(s) Signed: 02/19/2022 7:57:45 AM By: Worthy Keeler PA-C Entered By: Worthy Keeler on 02/19/2022 07:57:45 [name], [name] ([id number]) -------------------------------------------------------------------------------- Problem List Details Patient Name: [name], [name]. Date of Service: 02/19/2022 7:30 AM Medical Record Number: [id number] Patient Account Number: [id number] Date of Birth/Sex: [birth year] (61 y.o. M) Treating RN: Carlene Coria Primary Care Provider: [name] Other Clinician: Referring Provider: [name] Treating Provider/Extender: Skipper Cliche in Treatment: 3 Active Problems ICD-10 Encounter Code Description Active Date MDM Diagnosis [phone] Chronic multifocal osteomyelitis, left ankle and foot 01/29/2022 No Yes E11.621 Type 2 diabetes mellitus with foot  ulcer 01/29/2022 No Yes L97.524 Non-pressure chronic ulcer of other part of left foot with necrosis of 01/29/2022 No Yes bone T81.31XA Disruption of external operation (surgical) wound, not elsewhere 01/29/2022 No Yes classified, initial encounter Inactive Problems Resolved Problems Electronic Signature(s) Signed: 02/19/2022 7:36:57 AM By: Worthy Keeler PA-C Entered By: Worthy Keeler on 02/19/2022 07:36:57 [name] ([id number]) -------------------------------------------------------------------------------- Progress Note Details Patient Name: [name]. Date of Service: 02/19/2022 7:30 AM Medical Record Number: [id number] Patient Account Number: [id number] Date of Birth/Sex: Mar 02, 1961 (61 y.o. M) Treating RN: Carlene Coria Primary Care Provider: [name] Other Clinician: Referring Provider: [name] Treating Provider/Extender: Skipper Cliche in Treatment: 3 Subjective Chief Complaint Information obtained from Patient Left lateral foot ulcer History of Present Illness (HPI) Pleasant 62 year old with  history of diabetes (Hgb A1c 10.8 in 2014) and peripheral neuropathy. No PVD. L ABI 1.1. Status post right great toe partial amputation years ago. He was at work and on 10/22/2014, was injured by a cart, and suffered an ulceration to his left anterior calf. He says that it subsequently became infected, and he was treated with a course of antibiotics. He was found on initial exam to have an ulceration on the dorsum of his left third toe. He was unaware of this and attributes it to pressure from his steel toed boots. More recently he injured his right anterior calf on a cart. Ambulating normally per his baseline. He has been undergoing regular debridements, applying mupirocin cream, and an Ace wrap for edema control. He returns to clinic for follow-up and is without complaints. No pain. No fever or chills. No drainage. 10/25/15; this is a 61 year old man who has type  II diabetes with diabetic polyneuropathy. He tells me that he fractured his left fifth metatarsal in June 2016 when he presented with swelling. He does not recall a specific injury. His hemoglobin A1c was apparently too high at the time for consideration of surgery and he was put in some form of offloading. Ultimately he went to surgery in December with an allograft from his calcaneus to this site, plate and screws. He had an x-ray of the foot in March that showed concerns about nonunion. He tells me that in March he had to move and basically moved himself. He was on his foot a lot and then noticed some drainage from an open area. He has been following with his orthopedic surgeon Dr. Doran Durand. He has been applying a felt donut, dry dressing and using his heel healing sandal. 11/01/15; this is a patient I saw last week for the first time. He had a small open wound on the plantar aspect of his left foot at roughly the level of the base of his fifth metatarsal. He had a considerable degree of thickened skin around this wound on the plantar aspect which I thought was from chronic pressure on this area. He tells [place] that he had drainage over the course of the week. No systemic symptoms. 11/08/15; culture last week grew Citrobacter korseri. This should've been sensitive to the Augmentin I gave him. He has seen Dr. Doran Durand who did his initial surgery and according the patient the plan is to give this another month and then the hardware might need to come out of this. This seems like a reasonable plan. I will adjust his antibiotics to ciprofloxacin which probably should continue for at least another 2 weeks. I gave him 10 days worth today 11/22/15 the patient has completed antibiotics. He has an appointment with Dr. Doran Durand this Friday. There is improved dimensions around the wound on the left fifth metatarsal base 11/29/15; the patient has completed antibiotics last week. Apparently his appointment with Dr. Doran Durand it  is not until this Friday. Dimensions are roughly the same. 12/06/15; saw Dr. Doran Durand. No x-ray told the end of the month, next appointment June 30. We have been using Aquacel Ag 12/13/15: No major change this week. Using Aquacel AG 621/17; arrived this week with maceration around the wound. There was quite a bit of undermining which required surgical debridement. I changed him to [facility] last week, by the patient's admission he was up on this more this week 12/27/15; macerated tissue around the wound is removed with a scalpel and pickups. There is no undermining. Nonviable subcutaneous tissue  and skin taken from the superior circumference of the wound is slough from the surface. READMISSION 03/06/16 since I last saw this patient at the end of June, he went for surgery on 01/11/16 by Dr. Doran Durand of orthopedics. He had a left foot irrigation and debridement, removal of hardware and placement of wound VAC. He is also been followed by Dr. Megan Salon of infectious disease and completed a six-week course of IV Rocephin for group B strep and Enterobacter in the bone at the time of surgery. Apparently at the time of surgery the bone looked healthy so I don't think any bone was actually removed. He has been using silver alginate based dressings on the same wound area at the base of the left fifth metatarsal on the left. I note that he is also had arterial studies on 01/08/16, these showed a left ABI of 1.2 to and a right ABI of 1.3. Waveforms were listed as biphasic. He was not felt to have any specific arterial issues. 03/13/16; no real change in the condition of the wound at the left lateral foot at roughly the base of his fifth metatarsal. Use silver alginate last week. 03/18/16 arrives today with no open area. Being suspicious of the overlying callus I. Some of this back although I see nothing but covering tissue here/epithelium. There is no surrounding tenderness READMISSION 04/16/16 this is a patient I  discharged about a month ago. Initially a surgical wound on the lateral aspect of his left foot which subsequently became infected. The story he is giving today that he went back into his own modified shoe started to notice pain 2 weeks ago he was seen in Dr. Nona Dell office by a physician assistant last Wednesday and according the patient was told that everything looked fine however this is clearly now broken open and he has an open wound in the same spot that we have been dealing with repetitively. Situation is complicated by the fact that he is running short of money on long-term disability. He has not taken his insulin and at least 2 weeks was previously on NovoLog short acting insulin on a sliding scale and TRESIBA 35 units at bedtime. He is no longer able to afford any of his medication he was in the x-ray on 10/15. A plain x-ray showed healed fracture of the left fifth metatarsal bone status post removal of the associated plate and screw fixation hardware. There was no acute appearing osseous abnormality. His blood work showed a white count of 9.2 with an essentially normal differential comprehensive metabolic panel was normal. Previous CT scan of the foot on 01/08/16 showed no osteomyelitis previous vascular workup showed no evidence of significant PAD on 01/13/16 [name], [name] ([id number]) 04/23/16; culture I did last week grew enterococcus [ampicillin sensitive] and MRSA. He saw infectious disease yesterday. They stop the clindamycin and ordered an MRI. This is not unreasonable. All the hardware is out of the foot at this point. 04/30/16 at this point in time we are still awaiting the results of the MRI at this point in time. Patient did have an area which appears to be somewhat macerated in the proximal portion of the wound where there is overlying necrotic skin that is doing nothing more than trapping fluid underneath. He continues to state that he does have some discomfort and again the  concern is for the possibility of osteomyelitis hence the reason for the MRI order. We have been using a silver alginate dressing but again I think the reason  this with his macerated as it was is that the dressing obscene could not reach the entirety of the wound due to some necrotic skin covering the proximal portion. No bleeding noted at this point in time on initial evaluation. 05/07/16 we receive the results of patient's MRI which shows that he has no evidence of osteomyelitis. This obviously is excellent news. Patient was definitely happy to hear this. He tells me the wound appears to be doing very well at this point in time and is pleased with the progress currently. 05/15/16; as noted the patient did not have osteomyelitis. He has been released by infectious disease and orthopedics. His wound is still open he had a debridement last week but complain when he got home he "bleeding for half a day". He is not had any pain. We have been using silver alginate with Kerlix gauze wrap. 05/28/16; patient arrives today with the wound in much the same condition as last time. He has a small opening on the lateral aspect of his foot however with debridement there is clear undermining medially there is no real evidence of infection here and I didn't see any point in culturing this. One would have to wonder if this isn't a simple matter of in adequate offloading as he is been using a healing sandal. 06/05/16; the open area is now on the plantar aspect of his foot and not decide. The wound almost appears to have "migrated". This was the term use by our intake nurse. 06/12/16; open area on the plantar aspect of his foot. Base of the wound looks very healthy. This will be his second week in a total contact cast 06/19/16; patient arrived today in a total contact cast. There was some expectation from our staff and myself that this area would be healed. Unfortunately the area was boggy and with rec pressure a fairly  substantial amount of purulent drainage was obtained. Specimen obtained for culture. The patient had no complaints of systemic problems including fever or chills or instability of his diabetes. There was no pain in the foot. Nevertheless a extensive debridement was required. 06/26/16; patient's culture from the abscess last week grew a combination of MRSA and ampicillin sensitive enterococcus. I had him on Augmentin and Septra however I have elected to give him a full 10 day course of Zyvox instead as I Recently treated this combination of organisms with Augmentin and Septra before. Arrives today with no systemic symptoms 07/03/16; the patient has 2 more treatments of Zyvox and then he is finished antibiotics the wound has improved now mostly on the lateral aspect of his foot. There is still some tenderness when he walks. 07/10/16; patient arrives today with Zyvox completed. He only has a small open area remaining. 07/24/16; she arrives here with no overt open area. The covering is thick callus/eschar. Nevertheless there is no open area here. He has some tenderness underneath the area but no overt infection is observed no drainage. The patient has a deformity in the foot with this area weekly to be exposed to more pressure in his foot where nevertheless that something we are going to have to deal with going forward. The patient has diabetic workboots and diabetic shoes. He has had a long difficult course with the area here. This started as a fracture at work. He had bone grafting from his calcaneus and screws. This got infected he had to have more surgery on the area. Bone at the time of that surgery I think showed enterococcus and  group B strep. He had 6 weeks of Rocephin. Since then the area has waxed and waned in its difficulty. Recently he had an MRI in December that did not show osteomyelitis nevertheless he had an abscess that grew MRSA and enterococcus which I elected to treat with Zyvox. This was  in December and this wound is actually "healed" over 08/21/16; the patient came in today for his one-month follow-up visit. The area on the lateral aspect of his left foot looks much the same as some month ago. There is no evidence of an open wound here. However the patient tells me about a week after he went back to work he developed severe pain and swelling in the plantar aspect of his right foot first and fifth metatarsal heads. 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 He went to see his podiatrist at [facility]. She told him that he could not work on his feet. She told him to go back and his cam walking boot on the left. Not have open wounds obviously on the right. The patient is actually gone ahead and retired from his job because he does not feel he can work on his feet 09/18/16; patient comes back in saying he recently had some pain on the lateral aspect of his left foot. Asked that we look at this. Other than that all of his wounds have a viable surface. He has diabetic shoes. He is retired from work. READMISSION [name] Chalker is a 61 year old man who we cared for for a prolonged period of time in late 2017 into March 2018. At that point he had suffered a fracture of his left fifth metatarsal at about the level of the base of the fifth metatarsal. He had surgical repair by Dr. Meryl Dare and he developed a very refractory wound over the fifth metatarsal. Ultimately this became infected he required hardware removal this eventually closed over and we discharged from the clinic in March of this year. He tells me as well until roughly 3 weeks ago when he developed increasing pain in the same area making it difficult for him to sleep. He was admitted to hospital from 9/5 through 9/7 with now an ulcer in the same area on the lateral aspect of his left foot at the level of the base of the metatarsal. X-rays and MRIs  during this hospitalization were negative. Wound culture showed a combination of Escherichia coli, Morganella and Pseudomonas. He was given IV Cipro and vancomycin the hospital and then discharged home on Cipro and Doxy. He returned to the ER on 9/16 with leg swelling and discomfort. I don't think anything was added at that point although he did have an ultrasound of the left leg that was negative for DVT. He was back in the ER on 10/4 again with pain in the area he was given Bactrim but states he had a significant allergic reaction to that which has abated once he stopped the Bactrim. I don't think he had a full course. He went to see his podiatrist yesterday who recommended some form of foot soak. He has a Darco forefoot offloading boot The patient is a type II diabetic on insulin. Tells me his recent hemoglobin A1c was 7. Arterial studies done in July 2017 showed an ABI in the right of 1.3 on the left 1.2 to biphasic waveforms bilaterally. He was not felt to have arterial disease. As noted the  patient has had 2 MRIs most recently on 10/4 this showed no evidence of an abscess or osteomyelitis and no change since the prior study of 03/05/17 there was nonspecific edema on the dorsum of the foot. ABIs in this clinic today 1.2 which would be unchanged from his study done in July/17 04/15/17; now down to 1 small wound on the left lateral foot. We wrapped him and applied collagen last week and the foot is fairly macerated. 04/22/17; small wound on the left lateral foot however it has some undermining. Using collagen 04/29/17; small wound on the left lateral foot some surrounding callus. Chronic damage in this area as a result of initial fracture nonhealing and secondary infection. 05/06/17; the patient arrives today with the area on the left lateral foot closed. There is thick subcutaneous tissue with a layer of callus. I took some of the callus off just to ensure adequate closure of the underlying tissue  and there is no open wound here. He has considerable deformity of this area of his foot however and unless there is an ability to offload this I think opening is going to be recurrent. He tells me he has diabetic foot wear although I have not actually seen this Readmission: HASSAAN, CRITE ([id number]) 09/02/17 on evaluation today patient appears to be doing a little better compared to what he tells me what's going on in regard to his left lateral foot. He was previously seen in the ER this past Saturday it is now Tuesday. On Saturday he actually was treated with antibiotics including Cipro 500 mg two times a day and doxycycline 100 mg two times a day. Subsequently he also did have an x-ray performed of his left foot which revealed increased degenerative changes of the base of the fifth metatarsal since comparison in 2018. There was no radiographic evidence of osteomyelitis however. Patient has previously had an MRI of the left foot which was performed on 04/03/17 in this revealed at that point a soft tissue ulceration but no evidence of osteomyelitis. Patient has previously undergone testing in regard to his ABI's by Dr. Bridgett Larsson and this showed that he did have ABI was within normal limits which does correspond with ABI's we checked here in the office today. That study was on 01/13/16. With all that being said on evaluation today there does not appear to be any evidence of a true open wound of the left lateral fifth metatarsal region. He does have tenderness noted as well is a little bit of boggy/fluctuance feeling to the area in this region of his foot and there is definitely a very solid and firm callous noted laterally. With that being said I am not able to find any obvious opening at this point there does appear to be a spot where there appears to been a opening very recently however patient states he has not noted any drainage from this currently. He does however have discomfort with palpation of this  area this is in the 3-4/10 range only with very firm palpation this is not too significant at all. Subsequently he does have a small ulcer at the medial nail bed of the right second toe where he inadvertently pulled off a portion of the toenail softly causing this injury. Otherwise there does not appear to be any evidence of infection in regard to this right second toe. 09/23/17 on evaluation today patient actually appears to be doing well he has no open ulcerations noted at this point. With that being said he  did have significant callous noted over the left lateral foot which did require callous pairing today he tolerated this without complication. Readmission: 11/07/17 patient presents today for readmission concerning to ulcers that he has. One on the right plantar fifth metatarsal region and the other on the left plantar fifth metatarsal region. He has previously had an x-ray which was performed on 11/05/17 and revealed that the patient had wounds noted of the base of the fifth metatarsal region without evidence of destruction to the bone to suggest osteomyelitis. Obviously this is excellent news. With that being said he does have a significantly large ulcer that extensively plantar surface of the wound and is concerning for the fact that he continues to walk on this due to the patient telling me that he "has to work" I do believe this has likely led to both the opening of the wound as well as the fact that is not really able to heal very well at this point. He has been seen by his podiatrist and apparently according to the note dated 10/23/17 the patient was provided with Santyl and recommended a dry dressing. No wound culture has been obtained at this point. As stated above he has had returned to work due to financial reasons and is not able to wear his postop shoes due to the fact that he cannot wear this and work. The wound is stated to have been present for "several weeks" prior to the visit with  the podiatrist on 10/23/17. With that being said again at least the good news is there does not appear to be any evidence of osteomyelitis. 11/14/17 on evaluation today patient appears to be doing poorly in regard to his bilateral foot ulcers. Unfortunately he seems to have significant callous with a lot of maceration underlined especially on the right even more so than the left. He has been tolerating the dressing changes without complication. With that being said overall I do think he's gonna have to have a significant debridement today. 11/21/17 on evaluation today patient presents for follow-up concerning his bilateral lateral foot ulcers. He has been tolerating the dressing changes without complication. His wife helps perform these for him. With that being said the patient states that the left lateral foot ulcer actually is continuing to give him trouble as far as discomfort is concerned. With that being said actually appears better. 11/28/17 on evaluation today patient appears to be doing poorly in regard to his left lower extremity ulcer in particular. His right as made some progress on the lateral portion although the plantar portion is actually getting somewhat worse I think this is likely due to the fact that he continues to work although it hurts and he's been having a lot of issues out of this side and as far as pain is concerned. With that being said my biggest concern on evaluation today is that of the patient having issues with increased pain in the left lower extremity ulcer site especially on the plantar aspect. He also appears to have what seems to be an abscess developing in the dorsal/lateral portion of his foot which I think likely is communicating somewhere internally with the ulcer site. Nonetheless he has not had any fevers, chills, nausea, or vomiting noted at this time. He has however had fairly significant increase in pain and he was Artie having a lot of pain  previous. Readmission: 01-29-2022 upon evaluation today patient presents for initial inspection here in our clinic concerning the current issue which has been  present since around Oct 29, 2021. The patient tells me that he has at this point been seen by Dr. Vickki Muff who has been managing this up to this point. He has also been seen by Dr. Lazaro Arms I will outline the findings there shortly as well. With that being said currently according to Dr. Alvera Singh note on 6-20- 2023 the patient has a prescription for Zosyn that was given by infectious disease. Again that was back in June however. There was a hospital encounter for incision and drainage and debridement of the foot in order to clearway necrotic tissue. The patient also underwent vascular evaluation with Dr. Lucky Cowboy. Nonetheless on the postop visit on 12-18-2021 the patient was seeming to be doing quite well based on what Dr. Vickki Muff said but nonetheless since that time is really not made much progress and is concerned about losing his foot. He therefore requested a referral to wound care to be seen and evaluated and see if there is anything we can do to help and aid with getting this to heal. Fortunately there does not appear to be any evidence of active infection locally or systemically at this time which is great news. He tells me that he is currently using Betadine wet-to-dry dressings. He does not note any systemic infections but does have evidence of local infection based on what we are seeing. He has had a recent chest x-ray which showed that he had no evidence of acute cardiopulmonary disease that was performed on 12-12-2021. He also underwent vascular intervention with Dr. 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 Subsequently the patient states that Dr. Lucky Cowboy has mentioned that he would need to go back at  some time with Dr. Lucky Cowboy said he would consider a posterior tibial approach for revascularization in the future but at this time because of kidney status and the amount of dye used he did not want to proceed any further at that point. Again that something that would likely be scheduled down the road. Nonetheless right now the patient does have an ABI on the left of 0.90 with a TBI of 0.6-0 but this was decreased compared to previous. His ABI on the right was 1.08. Patient did have as well MRI of the left foot which was performed on 12-12-2021 and this shows that he does have interval resection of the proximal third of the fifth metatarsal and area of osteomyelitis on prior MRI. There is persistent ulceration of plantar lateral aspect at the resection site with a sinus tract extending towards the adjacent cuboid bone findings suspicious for osteomyelitis of the plantar lateral aspect of the cuboid and adjacent base of the fourth metatarsal possibly in the proximal most aspect of the remaining fifth metatarsal shaft as well. The ABI performed on 12-13-2021 showed that the left ABI was 0.83 which had previously been registered at 1.35 and the right ABI was 1.20. I think this is consistent with being able to heal but obviously would do better with vascular intervention not something that is going to be looked into in the future. This was noted above as well. Based on what I am seeing today I do believe that the patient is definitely a candidate for HBO therapy I think this may be in his best interest as I think he is at the point of  being in a limb threatening situation. I do believe that he would benefit from hyperbarics along with continued antibiotics along with good wound care measures as well. 02-04-2022 upon evaluation today patient appears to be doing still poorly in regard to his foot he is having a lot of drainage and he notes odor as well. With that being said we still have not heard anything back from  insurance yet as far as approval for HBO therapy. Subsequently the patient does have evidence of osteomyelitis and I think hyperbarics is definitely going to be something that would be beneficial for him. He has been on Kassis, Marsha F. ([id number]) antibiotics several times previous but unfortunately this just does not seem to be sustained enough we did do a culture it showed Pseudomonas as one of the primary organisms both organisms would be treated with Levaquin which I think is going to be the best way to go. I discussed that with the patient today and I Georgina Peer send that into the pharmacy. 02-11-2022 upon evaluation today patient appears to be doing well currently in regard to his wound. I do feel like the Levaquin is doing better for him which is great news. Fortunately I do not see any evidence of active infection locally or systemically at this time which is great news. No fevers, chills, nausea, vomiting, or diarrhea. 02-19-2022 upon evaluation today patient appears to be doing excellent in regard to his foot ulcer all things considered he still has bone exposed but the good news is we are at least making some progress here I think he does have a lot of odor he tells me and I think switching over to Dakin's moistened gauze dressing is probably can be the way to go. Objective Constitutional Well-nourished and well-hydrated in no acute distress. Vitals Time Taken: 7:35 AM, Height: 70 in, Weight: 255 lbs, BMI: 36.6, Temperature: 97.8 F, Pulse: 92 bpm, Respiratory Rate: 14 breaths/min, Blood Pressure: 159/85 mmHg. Respiratory normal breathing without difficulty. Psychiatric this patient is able to make decisions and demonstrates good insight into disease process. Alert and Oriented x 3. pleasant and cooperative. General Notes: Upon inspection patient's wound bed actually showed signs of good granulation epithelization at this point. Fortunately I do not see any evidence of infection locally  or systemically which is great news and overall I do believe that we are on the right track here. Nonetheless I do believe it is crucial for him to keep going with the hyperbarics I think also I would add another antibiotic for him I Georgina Peer send in a prescription for metronidazole which I think may be a good option here. Integumentary (Hair, Skin) Wound #14 status is Open. Original cause of wound was Blister. The date acquired was: 10/29/2021. The wound has been in treatment 3 weeks. The wound is located on the Left,Lateral Foot. The wound measures 4.7cm length x 6.5cm width x 1cm depth; 23.994cm^2 area and 23.994cm^3 volume. There is bone, muscle, and Fat Layer (Subcutaneous Tissue) exposed. There is no tunneling or undermining noted. There is a medium amount of sanguinous drainage noted. The wound margin is epibole. There is medium (34-66%) red, pink granulation within the wound bed. There is a medium (34-66%) amount of necrotic tissue within the wound bed including Adherent Slough. Assessment Active Problems ICD-10 Chronic multifocal osteomyelitis, left ankle and foot Type 2 diabetes mellitus with foot ulcer Non-pressure chronic ulcer of other part of left foot with necrosis of bone Disruption of external operation (surgical) wound, not elsewhere classified,  initial encounter Plan Follow-up Appointments: Return Appointment in 1 week. Bathing/ Shower/ Hygiene: May shower with wound dressing protected with water repellent cover or cast protector. Edema Control - Lymphedema / Segmental Compressive Device / Other: Elevate, Exercise Daily and Avoid Standing for Long Periods of Time. Elevate legs to the level of the heart and pump ankles as often as possible Fallaw, Kiondre F. ([id number]) Elevate leg(s) parallel to the floor when sitting. Off-Loading: Open toe surgical shoe - left Other: - knee scotter Hyperbaric Oxygen Therapy: Wound #14 Left,Lateral Foot: Evaluate for HBO Therapy Indication  and location: - Left foot osteomyelitis If appropriate for treatment, begin HBOT per protocol: 2.0 ATA for 90 Minutes without Air Breaks One treatment per day (delivered Monday through Friday unless otherwise specified in Special Instructions below): Total # of Treatments: - 40 Antihistamine 30 minutes prior to HBO Treatment, difficulty clearing ears. Finger stick Blood Glucose Pre- and Post- HBOT Treatment. Follow Hyperbaric Oxygen Glycemia Protocol The following medication(s) was prescribed: metronidazole oral 500 mg tablet 1 1 tablet oral taken 3 times per day x 30 days starting 02/19/2022 WOUND #14: - Foot Wound Laterality: Left, Lateral Cleanser: Byram Ancillary Kit - 15 Day Supply (Generic) 3 x Per Week/30 Days Discharge Instructions: Use supplies as instructed; Kit contains: (15) Saline Bullets; (15) 3x3 Gauze; 15 pr Gloves Cleanser: Soap and Water 3 x Per Week/30 Days Discharge Instructions: Gently cleanse wound with antibacterial soap, rinse and pat dry prior to dressing wounds Cleanser: Wound Cleanser 3 x Per Week/30 Days Discharge Instructions: Wash your hands with soap and water. Remove old dressing, discard into plastic bag and place into trash. Cleanse the wound with Wound Cleanser prior to applying a clean dressing using gauze sponges, not tissues or cotton balls. Do not scrub or use excessive force. Pat dry using gauze sponges, not tissue or cotton balls. Primary Dressing: Gauze 3 x Per Week/30 Days Discharge Instructions: moistened with Dakins Solution Secondary Dressing: ABD Pad 5x9 (in/in) (Generic) 3 x Per Week/30 Days Discharge Instructions: Cover with ABD pad Secured With: Medipore Tape - 103M Medipore H Soft Cloth Surgical Tape, 2x2 (in/yd) (Generic) 3 x Per Week/30 Days Secured With: [employer] Sterile or Non-Sterile 6-ply 4.5x4 (yd/yd) (Generic) 3 x Per Week/30 Days Discharge Instructions: Apply Kerlix as directed 1. I am going to suggest that we go ahead and continue  with the wound care measures as before and the patient is in agreement with plan this includes the use of the HBO treatment which I think is doing a good job here. 2. I am also going to recommend that we have the patient continue with the current Levaquin antibiotic which I think is doing a good job. 3. I am also good recommend adding metronidazole to the regimen as noted above. 4. I am also can recommend that we have the patient go ahead and continue with the Dakin's moistened gauze dressing that is the best dressing of choice going forward. I think this is still good to be ideal to be honest. 5. I am also can recommend that the patient should continue to monitor for any signs of worsening or infection if anything changes he will let me know. We will see patient back for reevaluation in 1 week here in the clinic. If anything worsens or changes patient will contact our office for additional recommendations. Electronic Signature(s) Signed: 02/19/2022 9:49:24 AM By: Worthy Keeler PA-C Entered By: Worthy Keeler on 02/19/2022 09:49:23 TARIN, NAVAREZ ([id number]) -------------------------------------------------------------------------------- SuperBill Details  Patient Name: MAKSIM, PEREGOY. Date of Service: 02/19/2022 Medical Record Number: [id number] Patient Account Number: [id number] Date of Birth/Sex: February 02, 1961 (61 y.o. M) Treating RN: Carlene Coria Primary Care Provider: [name] Other Clinician: Referring Provider: [name] Treating Provider/Extender: Skipper Cliche in Treatment: 3 Diagnosis Coding ICD-10 Codes Code Description [phone] Chronic multifocal osteomyelitis, left ankle and foot E11.621 Type 2 diabetes mellitus with foot ulcer L97.524 Non-pressure chronic ulcer of other part of left foot with necrosis of bone T81.31XA Disruption of external operation (surgical) wound, not elsewhere classified, initial encounter Facility Procedures CPT4 Code:  [id number] Description: 99213 - WOUND CARE VISIT-LEV 3 EST PT Modifier: Quantity: 1 Physician Procedures CPT4 Code: [id number] Description: 06269 - WC PHYS LEVEL 4 - EST PT Modifier: Quantity: 1 CPT4 Code: Description: ICD-10 Diagnosis Description M86.372 Chronic multifocal osteomyelitis, left ankle and foot E11.621 Type 2 diabetes mellitus with foot ulcer L97.524 Non-pressure chronic ulcer of other part of left foot with necrosis of bo T81.31XA Disruption  of external operation (surgical) wound, not elsewhere classifi Modifier: ne ed, initial encounter Quantity: Electronic Signature(s) Signed: 02/19/2022 9:49:53 AM By: Worthy Keeler PA-C Previous Signature: 02/19/2022 9:49:39 AM Version By: Worthy Keeler PA-C Entered By: Worthy Keeler on 02/19/2022 09:49:52

## 2022-02-19 NOTE — Progress Notes (Signed)
AMANI, NODARSE ([id number]) Visit Report for 02/19/2022 Arrival Information Details Patient Name: [name], [name]. Date of Service: 02/19/2022 8:45 AM Medical Record Number: [id number] Patient Account Number: [id number] Date of Birth/Sex: Jan 28, 1961 (61 y.o. M) Treating RN: Carlene Coria Primary Care Lety Cullens: [name] Other Clinician: [name] Referring Brayton Baumgartner: [name] Treating Christiona Siddique/Extender: Skipper Cliche in Treatment: 3 Visit Information History Since Last Visit Added or deleted any medications: No Patient Arrived: Knee Scooter Any new allergies or adverse reactions: No Arrival Time: 07:30 Had a fall or experienced change in No Accompanied By: self activities of daily living that may affect Transfer Assistance: None risk of falls: Patient Identification Verified: Yes Signs or symptoms of abuse/neglect since last visito No Secondary Verification Process Completed: Yes Hospitalized since last visit: No Patient Requires Transmission-Based Precautions: No Implantable device outside of the clinic excluding No Patient Has Alerts: No cellular tissue based products placed in the center since last visit: Has Dressing in Place as Prescribed: Yes Has Footwear/Offloading in Place as Prescribed: Yes Left: Surgical Shoe with Pressure Relief Insole Pain Present Now: No Electronic Signature(s) Signed: 02/19/2022 11:01:48 AM By: [name] RCP, RRT, CHT Entered By: [name] on 02/19/2022 08:43:29 [name] ([id number]) -------------------------------------------------------------------------------- Encounter Discharge Information Details Patient Name: [name]. Date of Service: 02/19/2022 8:45 AM Medical Record Number: [id number] Patient Account Number: [id number] Date of Birth/Sex: 04/19/1961 (61 y.o. M) Treating RN: Carlene Coria Primary Care Kissy Cielo: [name] Other Clinician: [name] Referring  Vonya Ohalloran: [name] Treating Lavergne Hiltunen/Extender: Skipper Cliche in Treatment: 3 Encounter Discharge Information Items Discharge Condition: Stable Ambulatory Status: Knee Scooter Discharge Destination: Home Transportation: Private Auto Accompanied By: self Schedule Follow-up Appointment: Yes Clinical Summary of Care: Notes Patient has an HBO treatment scheduled on 02/20/22 at 08:00 am. Electronic Signature(s) Signed: 02/19/2022 11:01:48 AM By: [name] RCP, RRT, CHT Entered By: [name] on 02/19/2022 10:54:40 [name] ([id number]) -------------------------------------------------------------------------------- Vitals Details Patient Name: [name]. Date of Service: 02/19/2022 8:45 AM Medical Record Number: [id number] Patient Account Number: [id number] Date of Birth/Sex: February 08, 1961 (61 y.o. M) Treating RN: Carlene Coria Primary Care Chamar Broughton: [name] Other Clinician: [name] Referring Kensley Lares: [name] Treating Joanie Duprey/Extender: Skipper Cliche in Treatment: 3 Vital Signs Time Taken: 07:35 Temperature (F): 97.8 Height (in): 70 Pulse (bpm): 92 Weight (lbs): 255 Respiratory Rate (breaths/min): 16 Body Mass Index (BMI): 36.6 Blood Pressure (mmHg): 159/85 Capillary Blood Glucose (mg/dl): 131 Reference Range: 80 - 120 mg / dl Electronic Signature(s) Signed: 02/19/2022 11:01:48 AM By: [name] RCP, RRT, CHT Entered By: [name] on 02/19/2022 08:47:17

## 2022-02-20 ENCOUNTER — Encounter (HOSPITAL_BASED_OUTPATIENT_CLINIC_OR_DEPARTMENT_OTHER): Payer: 59 | Admitting: Internal Medicine

## 2022-02-20 DIAGNOSIS — L97524 Non-pressure chronic ulcer of other part of left foot with necrosis of bone: Secondary | ICD-10-CM | POA: Diagnosis not present

## 2022-02-20 DIAGNOSIS — M86372 Chronic multifocal osteomyelitis, left ankle and foot: Secondary | ICD-10-CM | POA: Diagnosis not present

## 2022-02-20 DIAGNOSIS — E11621 Type 2 diabetes mellitus with foot ulcer: Secondary | ICD-10-CM

## 2022-02-20 LAB — GLUCOSE, CAPILLARY: Glucose-Capillary: 158 mg/dL — ABNORMAL HIGH (ref 70–99)

## 2022-02-20 NOTE — Progress Notes (Signed)
ROYSTON, BEKELE ([id number]) Visit Report for 02/20/2022 Arrival Information Details Patient Name: [name], [name]. Date of Service: 02/20/2022 8:00 AM Medical Record Number: [id number] Patient Account Number: [id number] Date of Birth/Sex: 05-Aug-1960 (61 y.o. M) Treating RN: Carlene Coria Primary Care Dara Camargo: [name] Other Clinician: [name] Referring Deloy Archey: [name] Treating Nelia Rogoff/Extender: Yaakov Guthrie in Treatment: 3 Visit Information History Since Last Visit Added or deleted any medications: No Patient Arrived: Knee Scooter Any new allergies or adverse reactions: No Arrival Time: 08:00 Had a fall or experienced change in No Accompanied By: self activities of daily living that may affect Transfer Assistance: None risk of falls: Patient Identification Verified: Yes Signs or symptoms of abuse/neglect since last visito No Secondary Verification Process Completed: Yes Hospitalized since last visit: No Patient Requires Transmission-Based Precautions: No Implantable device outside of the clinic excluding No Patient Has Alerts: No cellular tissue based products placed in the center since last visit: Has Dressing in Place as Prescribed: Yes Has Footwear/Offloading in Place as Prescribed: Yes Left: Surgical Shoe with Pressure Relief Insole Pain Present Now: No Electronic Signature(s) Signed: 02/20/2022 1:27:19 PM By: [name] RCP, RRT, CHT Entered By: [name] on 02/20/2022 08:55:14 [name] ([id number]) -------------------------------------------------------------------------------- Encounter Discharge Information Details Patient Name: [name]. Date of Service: 02/20/2022 8:00 AM Medical Record Number: [id number] Patient Account Number: [id number] Date of Birth/Sex: Aug 03, 1960 (61 y.o. M) Treating RN: Carlene Coria Primary Care Helmuth Recupero: [name] Other Clinician: [name] Referring  Gen Clagg: [name] Treating Mardene Lessig/Extender: Yaakov Guthrie in Treatment: 3 Encounter Discharge Information Items Discharge Condition: Stable Ambulatory Status: Knee Scooter Discharge Destination: Home Transportation: Private Auto Accompanied By: self Schedule Follow-up Appointment: Yes Clinical Summary of Care: Notes Patient has an ENT appointment with [facility] on 02/21/22 at 10:15. If finished in time he will come after that for HBO. Electronic Signature(s) Signed: 02/20/2022 1:27:19 PM By: [name] RCP, RRT, CHT Entered By: [name] on 02/20/2022 13:26:53 [name], [name] ([id number]) -------------------------------------------------------------------------------- [place] Details Patient Name: [name], [name]. Date of Service: 02/20/2022 8:00 AM Medical Record Number: [id number] Patient Account Number: [id number] Date of Birth/Sex: 1960/09/27 (61 y.o. M) Treating RN: Carlene Coria Primary Care Abdulwahab Demelo: [name] Other Clinician: [name] Referring Brenae Lasecki: [name] Treating Lennyx Verdell/Extender: Yaakov Guthrie in Treatment: 3 Vital Signs Time Taken: 08:01 Temperature (F): 97.7 Height (in): 70 Pulse (bpm): 78 Weight (lbs): 255 Respiratory Rate (breaths/min): 16 Body Mass Index (BMI): 36.6 Blood Pressure (mmHg): 122/62 Capillary Blood Glucose (mg/dl): 158 Reference Range: 80 - 120 mg / dl Electronic Signature(s) Signed: 02/20/2022 1:27:19 PM By: [name] RCP, RRT, CHT Entered By: [name] on 02/20/2022 08:55:48

## 2022-02-20 NOTE — Progress Notes (Signed)
MUSTAPHA, COLSON ([id number]) Visit Report for 02/20/2022 HBO Details Patient Name: [name], [name]. Date of Service: 02/20/2022 8:00 AM Medical Record Number: [id number] Patient Account Number: [id number] Date of Birth/Sex: 04-02-1961 (61 y.o. 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 HBO Treatment Details Treatment Number: 3 Patient Type: Outpatient Chamber Type: Monoplace Chamber Serial #: [id number] Treatment Protocol: 2.0 ATA with 90 minutes oxygen, and no air breaks Treatment Details Compression Rate Down: 1.0 psi / minute De-Compression Rate Up: 1.5 psi / minute Compress Tx Pressure Air breaks and breathing periods Decompress Decompress Begins Reached (leave unused spaces blank) Begins Ends Chamber Pressure (ATA) 1 2 - - - - - - 2 1 Clock Time (24 hr) 08:30 08:49 - - - - - - 10:20 10:30 Treatment Length: 120 (minutes) Treatment Segments: 4 Vital Signs Capillary Blood Glucose Reference Range: 80 - 120 mg / dl HBO Diabetic Blood Glucose Intervention Range: <131 mg/dl or >249 mg/dl Time Vitals Blood Respiratory Capillary Blood Glucose Pulse Action Type: Pulse: Temperature: Taken: Pressure: Rate: Glucose (mg/dl): Meter #: Oximetry (%) Taken: Pre 08:01 122/62 78 16 97.7 158 1 none per protocol Treatment Response Treatment Toleration: Well Treatment Completion Treatment Completed without Adverse Event Status: Treatment Notes Patient had trouble clearing both ears today. Pressure was decreased at 1.4 ATA and he was finally able to clear them when pressure was decreased to idle at 1.4 ATA and  once cleared we resumed to 2.0 ATA at a rate set of 1.0 psi/min. He was able to complete the dive but with a TEED score of 2 in the left ear and grade 3 in the right ear. [facility] was called as Dr. Heber [place] ordered an ENT consult for possible PE tube placement. The patient has an appointment tomorrow (02/21/22) at 10:15 with Dr. Virgia Land. Patient was called to inform him of the appointment. Timarion Agcaoili Notes Dr. Patrecia Pour evaluated the patient today after HBO treatment due to discomfort to both the ears. Upon exam patient had a grade 3 TEED on the right and grade 2 TEED on the left. We were able to have him scheduled to be evaluated by [facility] tomorrow. Once cleared he can restart HBO therapy. HBO Attestation I certify that I supervised this HBO treatment in accordance with Medicare guidelines. A trained emergency response team is readily Yes available per hospital policies and procedures. Continue HBOT as ordered. Yes Electronic Signature(s) Signed: 02/20/2022 1:39:42 PM By: Florentina Addison ([id number]) Previous Signature: 02/20/2022 1:27:19 PM Version By: [name] RCP, RRT, CHT Entered By: Kalman Shan on 02/20/2022 13:32:41 PRUDENCIO, VELAZCO ([id number]) -------------------------------------------------------------------------------- HBO Safety Checklist Details Patient Name: JASAI, SORG. Date of Service: 02/20/2022 8:00 AM Medical Record Number: [id number] Patient Account Number: [id number] Date of Birth/Sex: 1961-04-07 (61 y.o. M) Treating RN: Carlene Coria Primary Care Brandi Armato: [name] Other Clinician: [name] Referring Daijha Leggio: [name] Treating Reginald Mangels/Extender: Yaakov Guthrie in Treatment: 3 HBO Safety Checklist Items Safety Checklist Consent Form Signed Patient voided / foley secured and emptied When did you last eato 07:00 am Last dose of injectable or oral agent 02/19/22 pm Ostomy  pouch emptied and vented if applicable NA All implantable devices  assessed, documented and approved NA Intravenous access site secured and place NA Valuables secured Linens and cotton and cotton/polyester blend (less than 51% polyester) Personal oil-based products / skin lotions / body lotions removed Wigs or hairpieces removed NA Smoking or tobacco materials removed NA Books / newspapers / magazines / loose paper removed NA Cologne, aftershave, perfume and deodorant removed Jewelry removed (may wrap wedding band) Make-up removed NA Hair care products removed Battery operated devices (external) removed NA Heating patches and chemical warmers removed NA Titanium eyewear removed NA Nail polish cured greater than 10 hours NA Casting material cured greater than 10 hours NA Hearing aids removed NA Loose dentures or partials removed NA Prosthetics have been removed NA Patient demonstrates correct use of air break device (if applicable) Patient concerns have been addressed Patient grounding bracelet on and cord attached to chamber Specifics for Inpatients (complete in addition to above) Medication sheet sent with patient Intravenous medications needed or due during therapy sent with patient Drainage tubes (e.g. nasogastric tube or chest tube secured and vented) Endotracheal or Tracheotomy tube secured Cuff deflated of air and inflated with saline Airway suctioned Electronic Signature(s) Signed: 02/20/2022 1:27:19 PM By: [name] RCP, RRT, CHT Entered By: [name] on 02/20/2022 08:56:57

## 2022-02-20 NOTE — Progress Notes (Signed)
MAXIMIANO, LOTT ([id number]) Visit Report for 02/19/2022 Problem List Details Patient Name: [name], [name]. Date of Service: 02/19/2022 8:45 AM Medical Record Number: [id number] Patient Account Number: [id number] Date of Birth/Sex: 03-28-1961 (61 y.o. 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 Date of Service: 02/19/2022 Medical Record Number: [id number] Patient Account Number: [id number] Date of Birth/Sex: [birth year] (61 y.o. M) Treating RN: Carlene Coria Primary Care Provider: [name] Other Clinician: [name] Referring Provider: [name] Treating Provider/Extender: Skipper Cliche in Treatment: 3 Diagnosis Coding ICD-10 Codes Code Description [phone] Chronic multifocal osteomyelitis, left ankle and foot E11.621 Type 2 diabetes mellitus with foot ulcer L97.524 Non-pressure chronic ulcer of other part of left foot with necrosis of bone T81.31XA Disruption of external operation (surgical) wound, not elsewhere classified, initial encounter Facility  Procedures CPT4 Code: 31517616 Description: (Facility Use Only) HBOT, full body chamber, 59mn Modifier: Quantity: 4 Physician Procedures CPT4 Code: 60737106Description: 926948- WC PHYS HYPERBARIC OXYGEN THERAPY Modifier: Quantity: 1 CPT4 Code: Description: ICD-10 Diagnosis Description M86.372 Chronic multifocal osteomyelitis, left ankle and foot L97.524 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

## 2022-02-20 NOTE — Progress Notes (Signed)
[name], [name] ([id number]) Visit Report for 02/19/2022 HBO Details Patient Name: [name], [name]. Date of Service: 02/19/2022 8:45 AM Medical Record Number: [id number] Patient Account Number: [id number] Date of Birth/Sex: 05-Apr-1961 (61 y.o. 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 HBO Treatment Details Treatment Number: 2 Patient Type: Outpatient Chamber Type: Monoplace Chamber Serial #: [id number] Treatment Protocol: 2.0 ATA with 90 minutes oxygen, and no air breaks Treatment Details Compression Rate Down: 1.0 psi / minute De-Compression Rate Up: 1.5 psi / minute Compress Tx Pressure Air breaks and breathing periods Decompress Decompress Begins Reached (leave unused spaces blank) Begins Ends Chamber Pressure (ATA) 1 2 - - - - - - 2 1 Clock Time (24 hr) 08:07 08:18 - - - - - - 09:56 10:06 Treatment Length: 119 (minutes) Treatment Segments: 4 Vital Signs Capillary Blood Glucose Reference Range: 80 - 120 mg / dl HBO Diabetic Blood Glucose Intervention Range: <131 mg/dl or >249 mg/dl Time Capillary Blood Glucose Pulse Blood Respiratory Action Type: Vitals Pulse: Temperature: Glucose Meter Oximetry Pressure: Rate: Taken: Taken: (mg/dl): #: (%) Gave Ensure per [name], Pre 07:35 159/85 92 16 97.8 131 1 PA-C Post 10:35 124/68 66 16 98.3 168 1 none per protocol Pre-Treatment Ear Evaluation Left Right Clear: Yes Clear: Yes Left Teed Scale: Grade 0 Right Teed Scale: Grade 0 Treatment Response Treatment Toleration: Well Treatment Completion Treatment Completed without Adverse  Event Status: Treatment Notes Patient's blood glucose was 96 mg/dL yesterday and was treated with two bottles of Ensure per [name], PA-C. He was rechecked at 40 minutes for a blood glucose reading of 164 mg/dL. His reading today was 131 mg/dL so Ensure was prescribed by [name], PA-C to insure that his blood glucose did not drop too low today. Patient ate a hearty breakfast also this morning. Carrera Kiesel Notes Patient did have evidence of some barotrauma posttreatment today his ears did have fluid behind them and did not appear to be ruptured there was no bleeding. Nonetheless we Argun to use the Afrin and then we will subsequently reevaluate and see where things stand tomorrow I am hopeful this will not be a big issue and he can go into the chamber he was teed grade 1 bilaterally posttreatment. Post Treatment Teed Score Post Treatment Teed Score: Left Ear Grade I [name], [name] ([id number]) Post Treatment Teed Score: Right Ear Grade I HBO Attestation I certify that I supervised this HBO treatment in accordance with Medicare guidelines. A trained emergency response team is readily Yes available per hospital policies and procedures. Continue HBOT as ordered. Yes Electronic Signature(s) Signed: 02/19/2022 6:20:53 PM By: Worthy Keeler PA-C Previous Signature: 02/19/2022 11:01:48 AM Version By: [name] RCP, RRT, CHT Entered By: Worthy Keeler on 02/19/2022 18:20:53 [name], [name] ([id number]) -------------------------------------------------------------------------------- HBO Safety Checklist Details Patient Name: [name], [name]. Date of Service: 02/19/2022 8:45 AM Medical Record Number: [id number] Patient Account Number: [id number] Date of Birth/Sex: [birth year] (61 y.o. M) Treating RN: Carlene Coria Primary Care Mikayah Joy: [name] Other Clinician: [name] Referring Mykira Hofmeister: [name] Treating Rilley Stash/Extender: Skipper Cliche in Treatment:  3 HBO Safety Checklist Items Safety Checklist Consent Form Signed Patient voided / foley secured and emptied  When did you last eato am Last dose of injectable or oral agent 02/18/22 pm Ostomy pouch emptied and vented if applicable NA All implantable devices assessed, documented and approved NA Intravenous access site secured and place NA Valuables secured Linens and cotton and cotton/polyester blend (less than 51% polyester) Personal oil-based products / skin lotions / body lotions removed Wigs or hairpieces removed NA Smoking or tobacco materials removed NA Books / newspapers / magazines / loose paper removed NA Cologne, aftershave, perfume and deodorant removed Jewelry removed (may wrap wedding band) Make-up removed NA Hair care products removed Battery operated devices (external) removed NA Heating patches and chemical warmers removed NA Titanium eyewear removed NA Nail polish cured greater than 10 hours NA Casting material cured greater than 10 hours NA Hearing aids removed NA Loose dentures or partials removed NA Prosthetics have been removed NA Patient demonstrates correct use of air break device (if applicable) Patient concerns have been addressed Patient grounding bracelet on and cord attached to chamber Specifics for Inpatients (complete in addition to above) Medication sheet sent with patient Intravenous medications needed or due during therapy sent with patient Drainage tubes (e.g. nasogastric tube or chest tube secured and vented) Endotracheal or Tracheotomy tube secured Cuff deflated of air and inflated with saline Airway suctioned Electronic Signature(s) Signed: 02/19/2022 11:01:48 AM By: [name] RCP, RRT, CHT Entered By: [name] on 02/19/2022 08:53:33

## 2022-02-20 NOTE — Progress Notes (Signed)
[name], [name] ([id number]) Visit Report for 02/18/2022 HBO Details Patient Name: [name], [name]. Date of Service: 02/18/2022 8:00 AM Medical Record Number: [id number] Patient Account Number: [id number] Date of Birth/Sex: 01-10-1961 (61 y.o. 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 and Foot HBO Treatment Details Treatment Number: 1 Patient Type: Outpatient Chamber Type: Monoplace Chamber Serial #: [id number] Treatment Protocol: 2.0 ATA with 90 minutes oxygen, and no air breaks Treatment Details Compression Rate Down: 1.0 psi / minute De-Compression Rate Up: 1.0 psi / minute Compress Tx Pressure Air breaks and breathing periods Decompress Decompress Begins Reached (leave unused spaces blank) Begins Ends Chamber Pressure (ATA) 1 2 - - - - - - 2 1 Clock Time (24 hr) 09:02 09:18 - - - - - - 10:49 11:02 Treatment Length: 120 (minutes) Treatment Segments: 4 Vital Signs Capillary Blood Glucose Reference Range: 80 - 120 mg / dl HBO Diabetic Blood Glucose Intervention Range: <131 mg/dl or >249 mg/dl Time Capillary Blood Glucose Pulse Blood Respiratory Action Type: Vitals Pulse: Temperature: Glucose Meter Oximetry Pressure: Rate: Taken: Taken: (mg/dl): #: (%) Gave two Ensure per [name], Pre 08:09 130/62 72 18 98.6 96 1 PA-C Pre 08:51 164 none per protocol Treatment Response Treatment Toleration: Well Treatment Completion Treatment Completed without Adverse Event Status: Electronic Signature(s) Signed: 02/18/2022 11:40:58 AM By: [name] RCP, RRT, CHT Signed: 02/19/2022 6:22:21 PM By: Worthy Keeler PA-C Entered By: [name] on 02/18/2022 11:35:28 [name] ([id number]) -------------------------------------------------------------------------------- HBO Safety Checklist Details Patient Name: [name]. Date of Service: 02/18/2022 8:00 AM Medical Record Number: [id number] Patient Account Number: [id number] Date of Birth/Sex: 29-Oct-1960 (61 y.o. M) Treating RN: Carlene Coria Primary Care Kalissa Grays: [name] Other Clinician: [name] Referring Lennyn Gange: [name] Treating Jeren Dufrane/Extender: Skipper Cliche in Treatment: 2 HBO Safety Checklist Items Safety Checklist Consent Form Signed Patient voided / foley secured and emptied When did you last eato 7:00 am Last dose of injectable or oral agent 02/17/22 pm Lantus Ostomy pouch emptied and vented if applicable NA All implantable devices assessed, documented and approved NA Intravenous access site secured and place NA Valuables secured Linens and cotton and cotton/polyester blend (less than 51% polyester) Personal oil-based products / skin lotions / body lotions removed Wigs or hairpieces removed NA Smoking or tobacco materials removed NA Books / newspapers / magazines / loose paper removed NA Cologne, aftershave, perfume and deodorant removed Jewelry removed (may wrap wedding band) Make-up removed NA Hair care products removed Battery operated devices (external) removed NA Heating patches and chemical warmers removed NA Titanium eyewear removed NA Nail polish cured greater than 10 hours NA Casting material cured greater than 10 hours NA Hearing aids removed NA Loose dentures or partials removed NA Prosthetics have been removed NA Patient demonstrates correct use of air break device (if applicable) Patient concerns have been addressed Patient grounding bracelet on and cord attached to chamber Specifics for Inpatients (complete in addition  to above) Medication sheet sent with patient Intravenous medications needed or due during therapy sent with patient Drainage tubes (e.g. nasogastric tube or chest tube secured and vented) Endotracheal or Tracheotomy tube secured Cuff deflated of air and inflated with saline Airway suctioned Electronic  Signature(s) Signed: 02/18/2022 11:40:58 AM By: [name] RCP, RRT, CHT Entered By: [name] on 02/18/2022 09:42:14

## 2022-02-21 ENCOUNTER — Encounter: Payer: 59 | Admitting: Physician Assistant

## 2022-02-22 ENCOUNTER — Encounter: Payer: Self-pay | Admitting: Gastroenterology

## 2022-02-22 ENCOUNTER — Encounter: Payer: 59 | Admitting: Physician Assistant

## 2022-02-22 DIAGNOSIS — M86372 Chronic multifocal osteomyelitis, left ankle and foot: Secondary | ICD-10-CM | POA: Diagnosis not present

## 2022-02-22 LAB — GLUCOSE, CAPILLARY
Glucose-Capillary: 155 mg/dL — ABNORMAL HIGH (ref 70–99)
Glucose-Capillary: 143 mg/dL — ABNORMAL HIGH (ref 70–99)

## 2022-02-22 NOTE — Progress Notes (Signed)
TALAN, GILDNER ([id number]) Visit Report for 02/22/2022 HBO Details Patient Name: [name], [name]. Date of Service: 02/22/2022 7:30 AM Medical Record Number: [id number] Patient Account Number: [id number] Date of Birth/Sex: [birth year] (61 y.o. M) Treating RN: Carlene Coria Primary Care Sevin Farone: [name] Other Clinician: [name] Referring Kaytlynne Neace: [name] Treating Koriana Stepien/Extender: Skipper Cliche in Treatment: 3 HBO Treatment Course Details Treatment Course Number: 1 Ordering Jaslyne Beeck: [name] Total Treatments Ordered: 40 HBO Treatment Start Date: 02/18/2022 HBO Indication: Chronic Refractory Osteomyelitis to Left Ankle and Foot HBO Treatment Details Treatment Number: 4 Patient Type: Outpatient Chamber Type: Monoplace Chamber Serial #: [id number] Treatment Protocol: 2.0 ATA with 90 minutes oxygen, and no air breaks Treatment Details Compression Rate Down: 1.5 psi / minute De-Compression Rate Up: 1.5 psi / minute Compress Tx Pressure Air breaks and breathing periods Decompress Decompress Begins Reached (leave unused spaces blank) Begins Ends Chamber Pressure (ATA) 1 2 - - - - - - 2 1 Clock Time (24 hr) 08:02 08:13 - - - - - - 09:43 09:53 Treatment Length: 111 (minutes) Treatment Segments: 4 Vital Signs Capillary Blood Glucose Reference Range: 80 - 120 mg / dl HBO Diabetic Blood Glucose Intervention Range: <131 mg/dl or >249 mg/dl Time Vitals Blood Respiratory Capillary Blood Glucose Pulse Action Type: Pulse: Temperature: Taken: Pressure: Rate: Glucose (mg/dl): Meter #: Oximetry (%) Taken: Pre 07:35 126/64 72 16 97.9 155 1 none per protocol Post 10:09 126/24 66 16 97.9 143 1 none per protocol Pre-Treatment Ear Evaluation Left Right PE Tubes inserted: Yes PE Tubes inserted: Yes Treatment Response Treatment Toleration: Well Treatment Completion Treatment Completed without Adverse Event Status: Electronic Signature(s) Signed: 02/22/2022  11:13:21 AM By: [name] RCP, RRT, CHT Signed: 02/22/2022 12:36:15 PM By: Worthy Keeler PA-C Entered By: [name] on 02/22/2022 10:18:46 [name] ([id number]) -------------------------------------------------------------------------------- HBO Safety Checklist Details Patient Name: [name]. Date of Service: 02/22/2022 7:30 AM Medical Record Number: [id number] Patient Account Number: [id number] Date of Birth/Sex: 02/08/1961 (61 y.o. M) Treating RN: Carlene Coria Primary Care Kalyan Barabas: [name] Other Clinician: [name] Referring Laiya Wisby: [name] Treating Marni Franzoni/Extender: Skipper Cliche in Treatment: 3 HBO Safety Checklist Items Safety Checklist Consent Form Signed Patient voided / foley secured and emptied When did you last eato 07:00 am Last dose of injectable or oral agent 02/21/22 Ostomy pouch emptied and vented if applicable NA All implantable devices assessed, documented and approved NA Intravenous access site secured and place NA Valuables secured Linens and cotton and cotton/polyester blend (less than 51% polyester) Personal oil-based products / skin lotions / body lotions removed Wigs or hairpieces removed NA Smoking or tobacco materials removed NA Books / newspapers / magazines / loose paper removed NA Cologne, aftershave, perfume and deodorant removed Jewelry removed (may wrap wedding band) Make-up removed NA Hair care products removed Battery operated devices (external) removed NA Heating patches and chemical warmers removed NA Titanium eyewear removed NA Nail polish cured greater than 10 hours NA Casting material cured greater than 10 hours NA Hearing aids removed NA Loose dentures or partials removed NA Prosthetics have been removed NA Patient demonstrates correct use of air break device (if applicable) Patient concerns have been addressed Patient grounding bracelet on and  cord attached to chamber Specifics for Inpatients (complete in addition to above) Medication sheet sent with patient Intravenous medications needed or due during therapy sent with patient Drainage tubes (e.g. nasogastric tube or chest tube secured and vented) Endotracheal or Tracheotomy tube  secured Cuff deflated of air and inflated with saline Airway suctioned Electronic Signature(s) Signed: 02/22/2022 11:13:21 AM By: [name] RCP, RRT, CHT Entered By: [name] on 02/22/2022 08:24:46

## 2022-02-22 NOTE — Progress Notes (Signed)
[name], [name] ([id number]) Visit Report for 02/22/2022 Arrival Information Details Patient Name: [name], [name]. Date of Service: 02/22/2022 7:30 AM Medical Record Number: [id number] Patient Account Number: [id number] Date of Birth/Sex: [birth year] (61 y.o. M) Treating RN: Carlene Coria Primary Care Jerrine Urschel: [name] Other Clinician: [name] Referring Torsha Lemus: [name] Treating Refugia Laneve/Extender: Skipper Cliche in Treatment: 3 Visit Information History Since Last Visit Added or deleted any medications: No Patient Arrived: Knee Scooter Any new allergies or adverse reactions: No Arrival Time: 07:30 Had a fall or experienced change in No Accompanied By: self activities of daily living that may affect Transfer Assistance: None risk of falls: Patient Identification Verified: Yes Signs or symptoms of abuse/neglect since last visito No Secondary Verification Process Completed: Yes Hospitalized since last visit: No Patient Requires Transmission-Based Precautions: No Implantable device outside of the clinic excluding No Patient Has Alerts: No cellular tissue based products placed in the center since last visit: Has Dressing in Place as Prescribed: Yes Has Footwear/Offloading in Place as Prescribed: Yes Left: Surgical Shoe with Pressure Relief Insole Pain Present Now: No Electronic Signature(s) Signed: 02/22/2022 11:13:21 AM By: [name] RCP, RRT, CHT Entered By: [name] on 02/22/2022 08:21:44 [name] ([id number]) -------------------------------------------------------------------------------- Encounter Discharge Information Details Patient Name: [name], [name]. Date of Service: 02/22/2022 7:30 AM Medical Record Number: [id number] Patient Account Number: [id number] Date of Birth/Sex: May 15, 1961 (61 y.o. M) Treating RN: Carlene Coria Primary Care Vernie Vinciguerra: [name] Other Clinician: [name] Referring  Vaneza Pickart: [name] Treating Cipriano Millikan/Extender: Skipper Cliche in Treatment: 3 Encounter Discharge Information Items Discharge Condition: Stable Ambulatory Status: Knee Scooter Discharge Destination: Home Transportation: Private Auto Accompanied By: self Schedule Follow-up Appointment: Yes Clinical Summary of Care: Notes Patient has an HBO treatment scheduled on 02/25/22 at 08:00 am. Electronic Signature(s) Signed: 02/22/2022 11:13:21 AM By: [name] RCP, RRT, CHT Entered By: [name] on 02/22/2022 10:19:45 [name] ([id number]) -------------------------------------------------------------------------------- Vitals Details Patient Name: [name]. Date of Service: 02/22/2022 7:30 AM Medical Record Number: [id number] Patient Account Number: [id number] Date of Birth/Sex: 1961/03/18 (62 y.o. M) Treating RN: Carlene Coria Primary Care Nichalos Brenton: [name] Other Clinician: [name] Referring Gavino Fouch: [name] Treating Brennyn Haisley/Extender: Skipper Cliche in Treatment: 3 Vital Signs Time Taken: 07:35 Temperature (F): 97.9 Height (in): 70 Pulse (bpm): 72 Weight (lbs): 255 Respiratory Rate (breaths/min): 16 Body Mass Index (BMI): 36.6 Blood Pressure (mmHg): 126/64 Capillary Blood Glucose (mg/dl): 155 Reference Range: 80 - 120 mg / dl Electronic Signature(s) Signed: 02/22/2022 11:13:21 AM By: [name] RCP, RRT, CHT Entered By: [name] on 02/22/2022 08:22:27

## 2022-02-25 ENCOUNTER — Encounter: Admission: RE | Payer: Self-pay | Source: Home / Self Care

## 2022-02-25 ENCOUNTER — Encounter: Payer: 59 | Admitting: Physician Assistant

## 2022-02-25 ENCOUNTER — Ambulatory Visit: Admission: RE | Admit: 2022-02-25 | Payer: 59 | Source: Home / Self Care | Admitting: Gastroenterology

## 2022-02-25 DIAGNOSIS — M86372 Chronic multifocal osteomyelitis, left ankle and foot: Secondary | ICD-10-CM | POA: Diagnosis not present

## 2022-02-25 LAB — GLUCOSE, CAPILLARY
Glucose-Capillary: 124 mg/dL — ABNORMAL HIGH (ref 70–99)
Glucose-Capillary: 171 mg/dL — ABNORMAL HIGH (ref 70–99)

## 2022-02-25 SURGERY — COLONOSCOPY WITH PROPOFOL
Anesthesia: General

## 2022-02-25 NOTE — Progress Notes (Signed)
[name], [name] ([id number]) Visit Report for 02/25/2022 Arrival Information Details Patient Name: [name], [name]. Date of Service: 02/25/2022 8:45 AM Medical Record Number: [id number] Patient Account Number: [id number] Date of Birth/Sex: Sep 20, 1960 (61 y.o. M) Treating RN: Carlene Coria Primary Care Sharona Rovner: [name] Other Clinician: [name] Referring Naftoli Penny: [name] Treating Maansi Wike/Extender: Skipper Cliche in Treatment: 3 Visit Information History Since Last Visit Added or deleted any medications: No Patient Arrived: Knee Scooter Any new allergies or adverse reactions: No Arrival Time: 08:00 Had a fall or experienced change in No Accompanied By: self activities of daily living that may affect Transfer Assistance: None risk of falls: Patient Identification Verified: Yes Signs or symptoms of abuse/neglect since last visito No Secondary Verification Process Completed: Yes Hospitalized since last visit: No Patient Requires Transmission-Based Precautions: No Implantable device outside of the clinic excluding No Patient Has Alerts: No cellular tissue based products placed in the center since last visit: Has Dressing in Place as Prescribed: Yes Has Footwear/Offloading in Place as Prescribed: Yes Left: Surgical Shoe with Pressure Relief Insole Pain Present Now: No Electronic Signature(s) Signed: 02/25/2022 1:24:19 PM By: [name] RCP, RRT, CHT Entered By: [name] on 02/25/2022 08:35:45 [name] ([id number]) -------------------------------------------------------------------------------- Encounter Discharge Information Details Patient Name: [name]. Date of Service: 02/25/2022 8:45 AM Medical Record Number: [id number] Patient Account Number: [id number] Date of Birth/Sex: 05/16/1961 (61 y.o. M) Treating RN: Carlene Coria Primary Care Denver Harder: [name] Other Clinician: [name] Referring  Charee Tumblin: [name] Treating Lashanna Angelo/Extender: Skipper Cliche in Treatment: 3 Encounter Discharge Information Items Discharge Condition: Stable Ambulatory Status: Knee Scooter Discharge Destination: Home Transportation: Private Auto Accompanied By: self Schedule Follow-up Appointment: Yes Clinical Summary of Care: Notes This patient has an HBO treatment scheduled on 02/26/22 at 8:00 am. Electronic Signature(s) Signed: 02/25/2022 1:24:19 PM By: [name] RCP, RRT, CHT Entered By: [name] on 02/25/2022 11:23:12 [name] ([id number]) -------------------------------------------------------------------------------- Vitals Details Patient Name: [name]. Date of Service: 02/25/2022 8:45 AM Medical Record Number: [id number] Patient Account Number: [id number] Date of Birth/Sex: October 03, 1960 (61 y.o. M) Treating RN: Carlene Coria Primary Care Katarina Riebe: [name] Other Clinician: [name] Referring Eliud Polo: [name] Treating Maridee Slape/Extender: Skipper Cliche in Treatment: 3 Vital Signs Time Taken: 08:01 Temperature (F): 98.6 Height (in): 70 Pulse (bpm): 84 Weight (lbs): 255 Respiratory Rate (breaths/min): 16 Body Mass Index (BMI): 36.6 Blood Pressure (mmHg): 124/62 Capillary Blood Glucose (mg/dl): 124 Reference Range: 80 - 120 mg / dl Electronic Signature(s) Signed: 02/25/2022 1:24:19 PM By: [name] RCP, RRT, CHT Entered By: [name] on 02/25/2022 08:36:29

## 2022-02-25 NOTE — Progress Notes (Signed)
CHARLESTON, HANKIN ([id number]) Visit Report for 02/25/2022 HBO Details Patient Name: [name], [name]. Date of Service: 02/25/2022 8:45 AM Medical Record Number: [id number] Patient Account Number: [id number] Date of Birth/Sex: [birth year] (61 y.o. 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 Foot HBO Treatment Details Treatment Number: 5 Patient Type: Outpatient Chamber Type: Monoplace Chamber Serial #: [id number] Treatment Protocol: 2.0 ATA with 90 minutes oxygen, and no air breaks Treatment Details Compression Rate Down: 1.5 psi / minute De-Compression Rate Up: 1.5 psi / minute Compress Tx Pressure Air breaks and breathing periods Decompress Decompress Begins Reached (leave unused spaces blank) Begins Ends Chamber Pressure (ATA) 1 2 - - - - - - 2 1 Clock Time (24 hr) 08:14 08:25 - - - - - - 09:56 10:06 Treatment Length: 112 (minutes) Treatment Segments: 4 Vital Signs Capillary Blood Glucose Reference Range: 80 - 120 mg / dl HBO Diabetic Blood Glucose Intervention Range: <131 mg/dl or >249 mg/dl Time Vitals Blood Respiratory Capillary Blood Glucose Pulse Action Type: Pulse: Temperature: Taken: Pressure: Rate: Glucose (mg/dl): Meter #: Oximetry (%) Taken: Pre 08:01 124/62 84 16 98.6 124 1 Gave Ensure per protocol Post 10:21 122/64 72 16 98 171 1 none per protocol Treatment Response Treatment Toleration: Well Treatment Completion Treatment Completed without Adverse Event Status: Electronic Signature(s) Signed: 02/25/2022 1:24:19 PM By: Zackery Barefoot, RRT, CHT Signed: 02/25/2022 5:17:38 PM By: Worthy Keeler PA-C Entered By: [name] on 02/25/2022 11:21:51 [name] ([id number]) -------------------------------------------------------------------------------- HBO Safety Checklist Details Patient Name: [name]. Date of Service: 02/25/2022 8:45 AM Medical Record Number: [id number] Patient Account Number: [id number] Date of Birth/Sex: March 05, 1961 (62 y.o. M) Treating RN: Carlene Coria Primary Care Zoie Sarin: [name] Other Clinician: [name] Referring Shadow Schedler: [name] Treating Treyveon Mochizuki/Extender: Skipper Cliche in Treatment: 3 HBO Safety Checklist Items Safety Checklist Consent Form Signed Patient voided / foley secured and emptied When did you last eato 07:30 am Last dose of injectable or oral agent 02/24/22 pm Ostomy pouch emptied and vented if applicable NA All implantable devices assessed, documented and approved NA Intravenous access site secured and place NA Valuables secured Linens and cotton and cotton/polyester blend (less than 51% polyester) Personal oil-based products / skin lotions / body lotions removed Wigs or hairpieces removed NA Smoking or tobacco materials removed NA Books / newspapers / magazines / loose paper removed NA Cologne, aftershave, perfume and deodorant removed Jewelry removed (may wrap wedding band) Make-up removed NA Hair care products removed Battery operated devices (external) removed NA Heating patches and chemical warmers removed NA Titanium eyewear removed NA Nail polish cured greater than 10 hours NA Casting material cured greater than 10 hours NA Hearing aids removed NA Loose dentures or partials removed NA Prosthetics have been removed NA Patient demonstrates correct use of air break device (if applicable) Patient concerns have been addressed Patient grounding bracelet on and cord attached to chamber Specifics for Inpatients (complete in addition to above) Medication  sheet sent with patient Intravenous medications needed or due during therapy sent with patient Drainage tubes (e.g. nasogastric tube or chest tube secured and vented) Endotracheal or Tracheotomy tube secured Cuff deflated of air and inflated with saline Airway suctioned  Electronic Signature(s) Signed: 02/25/2022 1:24:19 PM By: [name] RCP, RRT, CHT Entered By: [name] on 02/25/2022 08:39:13

## 2022-02-26 ENCOUNTER — Encounter: Payer: 59 | Admitting: Physician Assistant

## 2022-02-26 DIAGNOSIS — M86372 Chronic multifocal osteomyelitis, left ankle and foot: Secondary | ICD-10-CM | POA: Diagnosis not present

## 2022-02-26 LAB — GLUCOSE, CAPILLARY
Glucose-Capillary: 128 mg/dL — ABNORMAL HIGH (ref 70–99)
Glucose-Capillary: 201 mg/dL — ABNORMAL HIGH (ref 70–99)

## 2022-02-26 NOTE — Progress Notes (Signed)
KORBEN, CARCIONE ([id number]) Visit Report for 02/26/2022 Problem List Details Patient Name: ZACKERIAH, KISSLER. Date of Service: 02/26/2022 8:00 AM Medical Record Number: [id number] Patient Account Number: [id number] Date of Birth/Sex: [birth year] (62 y.o. 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 Date of Service: 02/26/2022 Medical Record Number: [id number] Patient Account Number: [id number] Date of Birth/Sex: [birth year] (61 y.o. M) Treating RN: Cornell Barman Primary Care Provider: [name] Other Clinician: [name] Referring Provider: [name] Treating Provider/Extender: Skipper Cliche in Treatment: 4 Diagnosis Coding ICD-10 Codes Code Description [phone] Chronic multifocal osteomyelitis, left ankle and foot E11.621 Type 2 diabetes mellitus with foot ulcer L97.524 Non-pressure chronic ulcer of other part of left foot with necrosis of bone T81.31XA Disruption of external operation (surgical) wound, not elsewhere classified, initial encounter Facility  Procedures CPT4 Code: [id number] Description: (Facility Use Only) HBOT, full body chamber, 57mn Modifier: Quantity: 4 Physician Procedures CPT4 Code: [id number]: [phone] PHYS HYPERBARIC OXYGEN THERAPY Modifier: Quantity: 1 CPT4 Code: Description: ICD-10 Diagnosis Description M86.372 Chronic multifocal osteomyelitis, left ankle and foot L97.524 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

## 2022-02-26 NOTE — Progress Notes (Signed)
KAYLIB, FURNESS ([id number]) Visit Report for 02/26/2022 HBO Details Patient Name: [name], [name]. Date of Service: 02/26/2022 8:00 AM Medical Record Number: [id number] Patient Account Number: [id number] Date of Birth/Sex: 1960/11/02 (61 y.o. 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 HBO Treatment Details Treatment Number: 6 Patient Type: Outpatient Chamber Type: Monoplace Chamber Serial #: [id number] Treatment Protocol: 2.0 ATA with 90 minutes oxygen, and no air breaks Treatment Details Compression Rate Down: 1.5 psi / minute De-Compression Rate Up: 1.5 psi / minute Compress Tx Pressure Air breaks and breathing periods Decompress Decompress Begins Reached (leave unused spaces blank) Begins Ends Chamber Pressure (ATA) 1 2 - - - - - - 2 1 Clock Time (24 hr) 08:18 08:30 - - - - - - 10:00 10:10 Treatment Length: 112 (minutes) Treatment Segments: 4 Vital Signs Capillary Blood Glucose Reference Range: 80 - 120 mg / dl HBO Diabetic Blood Glucose Intervention Range: <131 mg/dl or >249 mg/dl Time Vitals Blood Respiratory Capillary Blood Glucose Pulse Action Type: Pulse: Temperature: Taken: Pressure: Rate: Glucose (mg/dl): Meter #: Oximetry (%) Taken: Pre 08:08 130/62 84 16 98.4 128 1 Gave two Ensure per PA-C Post 11:20 130/62 72 16 98.1 201 1 none per protocol Treatment Response Treatment Toleration: Well Treatment Completion Treatment Completed without Adverse Event Status: HBO Attestation I certify that I supervised this HBO treatment in accordance with Medicare guidelines. A trained emergency response  team is readily Yes available per hospital policies and procedures. Continue HBOT as ordered. Yes Electronic Signature(s) Signed: 02/26/2022 4:36:26 PM By: Worthy Keeler PA-C Previous Signature: 02/26/2022 10:57:29 AM Version By: [name] RCP, RRT, CHT Entered By: Worthy Keeler on 02/26/2022 16:36:26 CAMIL, WILHELMSEN ([id number]) -------------------------------------------------------------------------------- HBO Safety Checklist Details Patient Name: [name], [name]. Date of Service: 02/26/2022 8:00 AM Medical Record Number: [id number] Patient Account Number: [id number] Date of Birth/Sex: 14-Nov-1960 (61 y.o. M) Treating RN: Cornell Barman Primary Care Hester Forget: [name] Other Clinician: [name] Referring Reagan Behlke: [name] Treating Emalyn Schou/Extender: Skipper Cliche in Treatment: 4 HBO Safety Checklist Items Safety Checklist Consent Form Signed Patient voided / foley secured and emptied When did you last eato 02/25/22 pm Last dose of injectable or oral agent 02/25/22 pm Ostomy pouch emptied and vented if applicable NA All implantable devices assessed, documented and approved NA Intravenous access site secured and place NA Valuables secured Linens and cotton and cotton/polyester blend (less than 51% polyester) Personal oil-based products / skin lotions / body lotions removed Wigs or hairpieces removed NA Smoking or tobacco materials removed NA Books / newspapers / magazines / loose paper removed NA Cologne, aftershave, perfume and deodorant removed Jewelry removed (may wrap wedding band) Make-up removed NA Hair care products removed Battery operated devices (external) removed NA Heating patches and chemical warmers removed NA Titanium eyewear removed NA Nail polish cured greater than 10 hours NA Casting material cured greater than 10 hours NA Hearing aids removed NA Loose dentures or partials removed NA Prosthetics have been  removed NA Patient demonstrates correct use of air break device (if applicable) Patient concerns have been addressed Patient grounding bracelet on and cord attached to chamber Specifics for Inpatients (complete in addition to above) Medication sheet sent with  patient Intravenous medications needed or due during therapy sent with patient Drainage tubes (e.g. nasogastric tube or chest tube secured and vented) Endotracheal or Tracheotomy tube secured Cuff deflated of air and inflated with saline Airway suctioned Electronic Signature(s) Signed: 02/26/2022 10:57:29 AM By: [name] RCP, RRT, CHT Entered By: [name] on 02/26/2022 08:48:31

## 2022-02-26 NOTE — Progress Notes (Addendum)
[name] ([id number]) Visit Report for 02/26/2022 Chief Complaint Document Details Patient Name: [name]. Date of Service: 02/26/2022 11:15 AM Medical Record Number: [id number] Patient Account Number: [id number] Date of Birth/Sex: July 24, 1960 (61 y.o. M) Treating RN: Carlene Coria Primary Care Provider: [name] Other Clinician: Massie Kluver Referring Provider: [name] Treating Provider/Extender: Skipper Cliche in Treatment: 4 Information Obtained from: Patient Chief Complaint Left lateral foot ulcer Electronic Signature(s) Signed: 02/26/2022 10:54:30 AM By: Worthy Keeler PA-C Entered By: Worthy Keeler on 02/26/2022 10:54:30 [name] ([id number]) -------------------------------------------------------------------------------- HPI Details Patient Name: [name]. Date of Service: 02/26/2022 11:15 AM Medical Record Number: [id number] Patient Account Number: [id number] Date of Birth/Sex: 1960-08-27 (61 y.o. M) Treating RN: Carlene Coria Primary Care Provider: [name] Other Clinician: Massie Kluver Referring Provider: [name] Treating Provider/Extender: Skipper Cliche in Treatment: 4 History of Present Illness HPI Description: Pleasant 61 year old with history of diabetes (Hgb A1c 10.8 in 2014) and peripheral neuropathy. No PVD. L ABI 1.1. Status post right great toe partial amputation years ago. He was at work and on 10/22/2014, was injured by a cart, and suffered an ulceration to his left anterior calf. He says that it subsequently became infected, and he was treated with a course of antibiotics. He was found on initial exam to have an ulceration on the dorsum of his left third toe. He was unaware of this and attributes it to pressure from his steel toed boots. More recently he injured his right anterior calf on a cart. Ambulating normally per his baseline. He has been undergoing regular debridements, applying mupirocin  cream, and an Ace wrap for edema control. He returns to clinic for follow-up and is without complaints. No pain. No fever or chills. No drainage. 10/25/15; this is a 61 year old man who has type II diabetes with diabetic polyneuropathy. He tells me that he fractured his left fifth metatarsal in June 2016 when he presented with swelling. He does not recall a specific injury. His hemoglobin A1c was apparently too high at the time for consideration of surgery and he was put in some form of offloading. Ultimately he went to surgery in December with an allograft from his calcaneus to this site, plate and screws. He had an x-ray of the foot in March that showed concerns about nonunion. He tells me that in March he had to move and basically moved himself. He was on his foot a lot and then noticed some drainage from an open area. He has been following with his orthopedic surgeon Dr. Doran Durand. He has been applying a felt donut, dry dressing and using his heel healing sandal. 11/01/15; this is a patient I saw last week for the first time (61) (61). He had a small open wound on the plantar aspect of his left foot at roughly the level of the base of his fifth metatarsal. He had a considerable degree of thickened skin around this wound on the plantar aspect which I thought was from chronic pressure on this area. He tells [place] that he had drainage over the course of the week. No systemic symptoms. 11/08/15; culture last week grew Citrobacter korseri. This should've been sensitive to the Augmentin I gave him. He has seen Dr. Doran Durand who did his initial surgery and according the patient the plan is to give this another month and then the hardware might need to come out of this. This seems like a reasonable plan. I will adjust his antibiotics to ciprofloxacin which probably should  continue for at least another 2 weeks. I gave him 10 days worth today 11/22/15 the patient has completed antibiotics. He has an appointment with Dr. Doran Durand  this Friday. There is improved dimensions around the wound on the left fifth metatarsal base 11/29/15; the patient has completed antibiotics last week. Apparently his appointment with Dr. Doran Durand it is not until this Friday. Dimensions are roughly the same. 12/06/15; saw Dr. Doran Durand. No x-ray told the end of the month, next appointment June 30. We have been using Aquacel Ag 12/13/15: No major change this week. Using Aquacel AG 621/17; arrived this week with maceration around the wound. There was quite a bit of undermining which required surgical debridement. I changed him to [facility] last week, by the patient's admission he was up on this more this week 12/27/15; macerated tissue around the wound is removed with a scalpel and pickups. There is no undermining. Nonviable subcutaneous tissue and skin taken from the superior circumference of the wound is slough from the surface. READMISSION 03/06/16 since I last saw this patient at the end of June, he went for surgery on 01/11/16 by Dr. Doran Durand of orthopedics. He had a left foot irrigation and debridement, removal of hardware and placement of wound VAC. He is also been followed by Dr. Megan Salon of infectious disease and completed a six-week course of IV Rocephin for group B strep and Enterobacter in the bone at the time of surgery. Apparently at the time of surgery the bone looked healthy so I don't think any bone was actually removed. He has been using silver alginate based dressings on the same wound area at the base of the left fifth metatarsal on the left. I note that he is also had arterial studies on 01/08/16, these showed a left ABI of 1.2 to and a right ABI of 1.3. Waveforms were listed as biphasic. He was not felt to have any specific arterial issues. 03/13/16; no real change in the condition of the wound at the left lateral foot at roughly the base of his fifth metatarsal. Use silver alginate last week. 03/18/16 arrives today with no open area. Being  suspicious of the overlying callus I. Some of this back although I see nothing but covering tissue here/epithelium. There is no surrounding tenderness READMISSION 04/16/16 this is a patient I discharged about a month ago. Initially a surgical wound on the lateral aspect of his left foot which subsequently became infected. The story he is giving today that he went back into his own modified shoe started to notice pain 2 weeks ago he was seen in Dr. Nona Dell office by a physician assistant last Wednesday and according the patient was told that everything looked fine however this is clearly now broken open and he has an open wound in the same spot that we have been dealing with repetitively. Situation is complicated by the fact that he is running short of money on long-term disability. He has not taken his insulin and at least 2 weeks was previously on NovoLog short acting insulin on a sliding scale and TRESIBA 35 units at bedtime. He is no longer able to afford any of his medication he was in the x-ray on 10/15. A plain x-ray showed healed fracture of the left fifth metatarsal bone status post removal of the associated plate and screw fixation hardware. There was no acute appearing osseous abnormality. His blood work showed a white count of 9.2 with an essentially normal differential comprehensive metabolic panel was normal. Previous CT  scan of the foot on 01/08/16 showed no osteomyelitis previous vascular workup showed no evidence of significant PAD on 01/13/16 04/23/16; culture I did last week grew enterococcus [ampicillin sensitive] and MRSA. He saw infectious disease yesterday. They stop the clindamycin and ordered an MRI. This is not unreasonable. All the hardware is out of the foot at this point. 04/30/16 at this point in time we are still awaiting the results of the MRI at this point in time. Patient did have an area which appears to be somewhat macerated in the proximal portion of the wound where  there is overlying necrotic skin that is doing nothing more than trapping fluid [name], [name]. ([id number]) underneath. He continues to state that he does have some discomfort and again the concern is for the possibility of osteomyelitis hence the reason for the MRI order. We have been using a silver alginate dressing but again I think the reason this with his macerated as it was is that the dressing obscene could not reach the entirety of the wound due to some necrotic skin covering the proximal portion. No bleeding noted at this point in time on initial evaluation. 05/07/16 we receive the results of patient's MRI which shows that he has no evidence of osteomyelitis. This obviously is excellent news. Patient was definitely happy to hear this. He tells me the wound appears to be doing very well at this point in time and is pleased with the progress currently. 05/15/16; as noted the patient did not have osteomyelitis. He has been released by infectious disease and orthopedics. His wound is still open he had a debridement last week but complain when he got home he "bleeding for half a day". He is not had any pain. We have been using silver alginate with Kerlix gauze wrap. 05/28/16; patient arrives today with the wound in much the same condition as last time. He has a small opening on the lateral aspect of his foot however with debridement there is clear undermining medially there is no real evidence of infection here and I didn't see any point in culturing this. One would have to wonder if this isn't a simple matter of in adequate offloading as he is been using a healing sandal. 06/05/16; the open area is now on the plantar aspect of his foot and not decide. The wound almost appears to have "migrated". This was the term use by our intake nurse. 06/12/16; open area on the plantar aspect of his foot. Base of the wound looks very healthy. This will be his second week in a total contact cast 06/19/16;  patient arrived today in a total contact cast. There was some expectation from our staff and myself that this area would be healed. Unfortunately the area was boggy and with rec pressure a fairly substantial amount of purulent drainage was obtained. Specimen obtained for culture. The patient had no complaints of systemic problems including fever or chills or instability of his diabetes. There was no pain in the foot. Nevertheless a extensive debridement was required. 06/26/16; patient's culture from the abscess last week grew a combination of MRSA and ampicillin sensitive enterococcus. I had him on Augmentin and Septra however I have elected to give him a full 10 day course of Zyvox instead as I Recently treated this combination of organisms with Augmentin and Septra before. Arrives today with no systemic symptoms 07/03/16; the patient has 2 more treatments of Zyvox and then he is finished antibiotics the wound has improved now mostly on  the lateral aspect of his foot. There is still some tenderness when he walks. 07/10/16; patient arrives today with Zyvox completed. He only has a small open area remaining. 07/24/16; she arrives here with no overt open area. The covering is thick callus/eschar. Nevertheless there is no open area here. He has some tenderness underneath the area but no overt infection is observed no drainage. The patient has a deformity in the foot with this area weekly to be exposed to more pressure in his foot where nevertheless that something we are going to have to deal with going forward. The patient has diabetic workboots and diabetic shoes. He has had a long difficult course with the area here. This started as a fracture at work. He had bone grafting from his calcaneus and screws. This got infected he had to have more surgery on the area. Bone at the time of that surgery I think showed enterococcus and group B strep. He had 6 weeks of Rocephin. Since then the area has waxed and waned  in its difficulty. Recently he had an MRI in December that did not show osteomyelitis nevertheless he had an abscess that grew MRSA and enterococcus which I elected to treat with Zyvox. This was in December and this wound is actually "healed" over 08/21/16; the patient came in today for his one-month follow-up visit. The area on the lateral aspect of his left foot looks much the same as some month ago. There is no evidence of an open wound here. However the patient tells me about a week after he went back to work he developed severe pain and swelling in the plantar aspect of his right foot first and fifth metatarsal heads. 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 He went to see his podiatrist at [facility]. She told him that he could not work on his feet. She told him to go back and his cam walking boot on the left. Not have open wounds obviously on the right. The patient is actually gone ahead and retired from his job because he does not feel he can work on his feet 09/18/16; patient comes back in saying he recently had some pain on the lateral aspect of his left foot. Asked that we look at this. Other than that all of his wounds have a viable surface. He has diabetic shoes. He is retired from work. READMISSION Bertran Zeimet is a 61 year old man who we cared for for a prolonged period of time in late 2017 into March 2018. At that point he had suffered a fracture of his left fifth metatarsal at about the level of the base of the fifth metatarsal. He had surgical repair by Dr. Meryl Dare and he developed a very refractory wound over the fifth metatarsal. Ultimately this became infected he required hardware removal this eventually closed over and we discharged from the clinic in March of this year. He tells me as well until roughly 3 weeks ago when he developed increasing pain in the same area making it difficult for him  to sleep. He was admitted to hospital from 9/5 through 9/7 with now an ulcer in the same area on the lateral aspect of his left foot at the level of the base of the metatarsal. X-rays and MRIs during this hospitalization were negative. Wound culture showed a combination of Escherichia coli, Morganella and Pseudomonas. He was given IV Cipro and vancomycin the hospital  and then discharged home on Cipro and Doxy. He returned to the ER on 9/16 with leg swelling and discomfort. I don't think anything was added at that point although he did have an ultrasound of the left leg that was negative for DVT. He was back in the ER on 10/4 again with pain in the area he was given Bactrim but states he had a significant allergic reaction to that which has abated once he stopped the Bactrim. I don't think he had a full course. He went to see his podiatrist yesterday who recommended some form of foot soak. He has a Darco forefoot offloading boot The patient is a type II diabetic on insulin. Tells me his recent hemoglobin A1c was 7. Arterial studies done in July 2017 showed an ABI in the right of 1.3 on the left 1.2 to biphasic waveforms bilaterally. He was not felt to have arterial disease. As noted the patient has had 2 MRIs most recently on 10/4 this showed no evidence of an abscess or osteomyelitis and no change since the prior study of 03/05/17 there was nonspecific edema on the dorsum of the foot. ABIs in this clinic today 1.2 which would be unchanged from his study done in July/17 04/15/17; now down to 1 small wound on the left lateral foot. We wrapped him and applied collagen last week and the foot is fairly macerated. 04/22/17; small wound on the left lateral foot however it has some undermining. Using collagen 04/29/17; small wound on the left lateral foot some surrounding callus. Chronic damage in this area as a result of initial fracture nonhealing and secondary infection. 05/06/17; the patient arrives today  with the area on the left lateral foot closed. There is thick subcutaneous tissue with a layer of callus. I took some of the callus off just to ensure adequate closure of the underlying tissue and there is no open wound here. He has considerable deformity of this area of his foot however and unless there is an ability to offload this I think opening is going to be recurrent. He tells me he has diabetic foot wear although I have not actually seen this Readmission: 09/02/17 on evaluation today patient appears to be doing a little better compared to what he tells me what's going on in regard to his left lateral foot. He was previously seen in the ER this past Saturday it is now Tuesday. On Saturday he actually was treated with antibiotics including Cipro 500 mg two times a day and doxycycline 100 mg two times a day. Subsequently he also did have an x-ray performed of his left foot which revealed increased degenerative changes of the base of the fifth metatarsal since comparison in 2018. There was no radiographic evidence of osteomyelitis however. Patient has previously had an MRI of the left foot which was performed on 04/03/17 in this revealed at that point a soft [name], [name]. ([id number]) tissue ulceration but no evidence of osteomyelitis. Patient has previously undergone testing in regard to his ABI's by Dr. Bridgett Larsson and this showed that he did have ABI was within normal limits which does correspond with ABI's we checked here in the office today. That study was on 01/13/16. With all that being said on evaluation today there does not appear to be any evidence of a true open wound of the left lateral fifth metatarsal region. He does have tenderness noted as well is a little bit of boggy/fluctuance feeling to the area in this region of his foot  and there is definitely a very solid and firm callous noted laterally. With that being said I am not able to find any obvious opening at this point there does appear  to be a spot where there appears to been a opening very recently however patient states he has not noted any drainage from this currently. He does however have discomfort with palpation of this area this is in the 3-4/10 range only with very firm palpation this is not too significant at all. Subsequently he does have a small ulcer at the medial nail bed of the right second toe where he inadvertently pulled off a portion of the toenail softly causing this injury. Otherwise there does not appear to be any evidence of infection in regard to this right second toe. 09/23/17 on evaluation today patient actually appears to be doing well he has no open ulcerations noted at this point. With that being said he did have significant callous noted over the left lateral foot which did require callous pairing today he tolerated this without complication. Readmission: 11/07/17 patient presents today for readmission concerning to ulcers that he has. One on the right plantar fifth metatarsal region and the other on the left plantar fifth metatarsal region. He has previously had an x-ray which was performed on 11/05/17 and revealed that the patient had wounds noted of the base of the fifth metatarsal region without evidence of destruction to the bone to suggest osteomyelitis. Obviously this is excellent news. With that being said he does have a significantly large ulcer that extensively plantar surface of the wound and is concerning for the fact that he continues to walk on this due to the patient telling me that he "has to work" I do believe this has likely led to both the opening of the wound as well as the fact that is not really able to heal very well at this point. He has been seen by his podiatrist and apparently according to the note dated 10/23/17 the patient was provided with Santyl and recommended a dry dressing. No wound culture has been obtained at this point. As stated above he has had returned to work due to  financial reasons and is not able to wear his postop shoes due to the fact that he cannot wear this and work. The wound is stated to have been present for "several weeks" prior to the visit with the podiatrist on 10/23/17. With that being said again at least the good news is there does not appear to be any evidence of osteomyelitis. 11/14/17 on evaluation today patient appears to be doing poorly in regard to his bilateral foot ulcers. Unfortunately he seems to have significant callous with a lot of maceration underlined especially on the right even more so than the left. He has been tolerating the dressing changes without complication. With that being said overall I do think he's gonna have to have a significant debridement today. 11/21/17 on evaluation today patient presents for follow-up concerning his bilateral lateral foot ulcers. He has been tolerating the dressing changes without complication. His wife helps perform these for him. With that being said the patient states that the left lateral foot ulcer actually is continuing to give him trouble as far as discomfort is concerned. With that being said actually appears better. 11/28/17 on evaluation today patient appears to be doing poorly in regard to his left lower extremity ulcer in particular. His right as made some progress on the lateral portion although the plantar portion is  actually getting somewhat worse I think this is likely due to the fact that he continues to work although it hurts and he's been having a lot of issues out of this side and as far as pain is concerned. With that being said my biggest concern on evaluation today is that of the patient having issues with increased pain in the left lower extremity ulcer site especially on the plantar aspect. He also appears to have what seems to be an abscess developing in the dorsal/lateral portion of his foot which I think likely is communicating somewhere internally with the ulcer site.  Nonetheless he has not had any fevers, chills, nausea, or vomiting noted at this time. He has however had fairly significant increase in pain and he was Artie having a lot of pain previous. Readmission: 01-29-2022 upon evaluation today patient presents for initial inspection here in our clinic concerning the current issue which has been present since around Oct 29, 2021. The patient tells me that he has at this point been seen by Dr. Vickki Muff who has been managing this up to this point. He has also been seen by Dr. Lazaro Arms I will outline the findings there shortly as well. With that being said currently according to Dr. Alvera Singh note on 6-20- 2023 the patient has a prescription for Zosyn that was given by infectious disease. Again that was back in June however. There was a hospital encounter for incision and drainage and debridement of the foot in order to clearway necrotic tissue. The patient also underwent vascular evaluation with Dr. Lucky Cowboy. Nonetheless on the postop visit on 12-18-2021 the patient was seeming to be doing quite well based on what Dr. Vickki Muff said but nonetheless since that time is really not made much progress and is concerned about losing his foot. He therefore requested a referral to wound care to be seen and evaluated and see if there is anything we can do to help and aid with getting this to heal. Fortunately there does not appear to be any evidence of active infection locally or systemically at this time which is great news. He tells me that he is currently using Betadine wet-to-dry dressings. He does not note any systemic infections but does have evidence of local infection based on what we are seeing. He has had a recent chest x-ray which showed that he had no evidence of acute cardiopulmonary disease that was performed on 12-12-2021. He also underwent vascular intervention with Dr. Lucky Cowboy and this was on 12-17-2021 and that actually did not improve his blood flow as he was unable to get to the  area of concern that there is a 70% stenosis at the origin of the tibioperoneal trunk. Subsequently the patient states that Dr. Lucky Cowboy has mentioned that he would need to go back at some time with Dr. Lucky Cowboy said he would consider a posterior tibial approach for revascularization in the future but at this time because of kidney status and the amount of dye used he did not want to proceed any further at that point. Again that something that would likely be scheduled down the road. Nonetheless right now the patient does have an ABI on the left of 0.90 with a TBI of 0.6-0 but this was decreased compared to previous. His ABI on the right was 1.08. Patient did have as well MRI of the left foot which was performed on 12-12-2021 and this shows that he does have interval resection of the proximal third of the fifth metatarsal and area  of osteomyelitis on prior MRI. There is persistent ulceration of plantar lateral aspect at the resection site with a sinus tract extending towards the adjacent cuboid bone findings suspicious for osteomyelitis of the plantar lateral aspect of the cuboid and adjacent base of the fourth metatarsal possibly in the proximal most aspect of the remaining fifth metatarsal shaft as well. The ABI performed on 12-13-2021 showed that the left ABI was 0.83 which had previously been registered at 1.35 and the right ABI was 1.20. I think this is consistent with being able to heal but obviously would do better with vascular intervention not something that is going to be looked into in the future. This was noted above as well. Based on what I am seeing today I do believe that the patient is definitely a candidate for HBO therapy I think this may be in his best interest as I think he is at the point of being in a limb threatening situation. I do believe that he would benefit from hyperbarics along with continued antibiotics along with good wound care measures as well. 02-04-2022 upon evaluation today  patient appears to be doing still poorly in regard to his foot he is having a lot of drainage and he notes odor as well. With that being said we still have not heard anything back from insurance yet as far as approval for HBO therapy. Subsequently the patient does have evidence of osteomyelitis and I think hyperbarics is definitely going to be something that would be beneficial for him. He has been on antibiotics several times previous but unfortunately this just does not seem to be sustained enough we did do a culture it showed Pseudomonas as one of the primary organisms both organisms would be treated with Levaquin which I think is going to be the best way to go. I discussed that with the patient today and I Georgina Peer send that into the pharmacy. 02-11-2022 upon evaluation today patient appears to be doing well currently in regard to his wound. I do feel like the Levaquin is doing better for [name], [name] ([id number]) him which is great news. Fortunately I do not see any evidence of active infection locally or systemically at this time which is great news. No fevers, chills, nausea, vomiting, or diarrhea. 02-19-2022 upon evaluation today patient appears to be doing excellent in regard to his foot ulcer all things considered he still has bone exposed but the good news is we are at least making some progress here I think he does have a lot of odor he tells me and I think switching over to Dakin's moistened gauze dressing is probably can be the way to go. 02-26-2022 upon evaluation today patient appears to be doing well currently in regard to his wound all things considered. Although the 1 thing I do see is that he is not changing the dressing as frequently as he should be. Fortunately there does not appear to be any signs of active infection he tells me his wife is out of town from Saturday through today so it has not been changed since Saturday before that they have been doing this every other day. I  explained that with the Dakin's he needs to do this every day in order to make sure that it heals appropriately. This will also help with the odor which she has been complaining of. Electronic Signature(s) Signed: 02/26/2022 1:04:30 PM By: Worthy Keeler PA-C Entered By: Worthy Keeler on 02/26/2022 13:04:29 [name] ([id number]) --------------------------------------------------------------------------------  Physical Exam Details Patient Name: RANDOL, ZUMSTEIN. Date of Service: 02/26/2022 11:15 AM Medical Record Number: [id number] Patient Account Number: [id number] Date of Birth/Sex: 09/15/1960 (61 y.o. M) Treating RN: Carlene Coria Primary Care Provider: [name] Other Clinician: Massie Kluver Referring Provider: [name] Treating Provider/Extender: Skipper Cliche in Treatment: 4 Constitutional Well-nourished and well-hydrated in no acute distress. Respiratory normal breathing without difficulty. Psychiatric this patient is able to make decisions and demonstrates good insight into disease process. Alert and Oriented x 3. pleasant and cooperative. Notes Upon inspection patient's wound actually appears to be doing well with some minimal slough and biofilm noted that wiped off easily. No sharp debridement was necessary today. Electronic Signature(s) Signed: 02/26/2022 1:04:59 PM By: Worthy Keeler PA-C Entered By: Worthy Keeler on 02/26/2022 13:04:59 [name] ([id number]) -------------------------------------------------------------------------------- Physician Orders Details Patient Name: [name], [name]. Date of Service: 02/26/2022 11:15 AM Medical Record Number: [id number] Patient Account Number: [id number] Date of Birth/Sex: 01-21-1961 (61 y.o. M) Treating RN: Carlene Coria Primary Care Provider: [name] Other Clinician: Massie Kluver Referring Provider: [name] Treating Provider/Extender: Skipper Cliche in Treatment: 4 Verbal  / Phone Orders: No Diagnosis Coding ICD-10 Coding Code Description [id number] Chronic multifocal osteomyelitis, left ankle and foot E11.621 Type 2 diabetes mellitus with foot ulcer L97.524 Non-pressure chronic ulcer of other part of left foot with necrosis of bone T81.31XA Disruption of external operation (surgical) wound, not elsewhere classified, initial encounter Follow-up Appointments o Return Appointment in 1 week. Bathing/ Shower/ Hygiene o May shower with wound dressing protected with water repellent cover or cast protector. Edema Control - Lymphedema / Segmental Compressive Device / Other o Elevate, Exercise Daily and Avoid Standing for Long Periods of Time. o Elevate legs to the level of the heart and pump ankles as often as possible o Elevate leg(s) parallel to the floor when sitting. Off-Loading o Open toe surgical shoe - left o Other: - knee scotter Additional Orders / Instructions o Other: - need daily dressing changes Hyperbaric Oxygen Therapy Wound #14 Left,Lateral Foot o Evaluate for HBO Therapy o Indication and location: - Left foot osteomyelitis o If appropriate for treatment, begin HBOT per protocol: o 2.0 ATA for 90 Minutes without Air Breaks o One treatment per day (delivered Monday through Friday unless otherwise specified in Special Instructions below): o Total # of Treatments: - 40 o Antihistamine 30 minutes prior to HBO Treatment, difficulty clearing ears. o Finger stick Blood Glucose Pre- and Post- HBOT Treatment. o Follow Hyperbaric Oxygen Glycemia Protocol Wound Treatment Wound #14 - Foot Wound Laterality: Left, Lateral Cleanser: [place] Ancillary Kit - 15 Day Supply (DME) (Generic) 3 x Per Week/30 Days Discharge Instructions: Use supplies as instructed; Kit contains: (15) Saline Bullets; (15) 3x3 Gauze; 15 pr Gloves Cleanser: Soap and Water 3 x Per Week/30 Days Discharge Instructions: Gently cleanse wound with  antibacterial soap, rinse and pat dry prior to dressing wounds Cleanser: Wound Cleanser 3 x Per Week/30 Days Discharge Instructions: Wash your hands with soap and water. Remove old dressing, discard into plastic bag and place into trash. Cleanse the wound with Wound Cleanser prior to applying a clean dressing using gauze sponges, not tissues or cotton balls. Do not scrub or use excessive force. Pat dry using gauze sponges, not tissue or cotton balls. Primary Dressing: Gauze (DME) (Generic) 3 x Per Week/30 Days Discharge Instructions: moistened with Dakins Solution Secondary Dressing: ABD Pad 5x9 (in/in) (DME) (Generic) 3 x Per Week/30 Days [name], [name]. ([id number])  Discharge Instructions: Cover with ABD pad Secured With: Medipore Tape - 74M Medipore H Soft Cloth Surgical Tape, 2x2 (in/yd) (DME) (Generic) 3 x Per Week/30 Days Secured With: [employer] Sterile or Non-Sterile 6-ply 4.5x4 (yd/yd) (DME) (Generic) 3 x Per Week/30 Days Discharge Instructions: Apply Kerlix as directed Patient Medications Allergies: Bactrim Notifications Medication Indication Start End levofloxacin 02/26/2022 DOSE 1 - oral 750 mg tablet - 1 tablet oral taken 1 time per day for 30 days GLYCEMIA INTERVENTIONS PROTOCOL PRE-HBO GLYCEMIA INTERVENTIONS ACTION INTERVENTION Obtain pre-HBO capillary blood glucose (ensure 1 physician order is in chart). A. Notify HBO physician and await physician orders. 2 If result is 70 mg/dl or below: B. If the result meets the hospital definition of a critical result, follow hospital policy. A. Give patient an 8 ounce Glucerna Shake, an 8 ounce Ensure, or 8 ounces of a Glucerna/Ensure equivalent dietary supplement*. B. Wait 30 minutes. If result is 71 mg/dl to 130 mg/dl: C. Retest patientos capillary blood glucose (CBG). D. If result greater than or equal to 110 mg/dl, proceed with HBO. If result less than 110 mg/dl, notify HBO physician and consider holding HBO. If  result is 131 mg/dl to 249 mg/dl: A. Proceed with HBO. A. Notify HBO physician and await physician orders. B. It is recommended to hold HBO and do If result is 250 mg/dl or greater: blood/urine ketone testing. C. If the result meets the hospital definition of a critical result, follow hospital policy. POST-HBO GLYCEMIA INTERVENTIONS ACTION INTERVENTION Obtain post HBO capillary blood glucose (ensure 1 physician order is in chart). A. Notify HBO physician and await physician orders. 2 If result is 70 mg/dl or below: B. If the result meets the hospital definition of a critical result, follow hospital policy. A. Give patient an 8 ounce Glucerna Shake, an 8 ounce Ensure, or 8 ounces of a Glucerna/Ensure equivalent dietary supplement*. B. Wait 15 minutes for symptoms of hypoglycemia (i.e. nervousness, anxiety, If result is 71 mg/dl to 100 mg/dl: sweating, chills, clamminess, irritability, confusion, tachycardia or dizziness). C. If patient asymptomatic, discharge patient. If patient symptomatic, repeat capillary blood glucose (CBG) and notify HBO physician. If result is 101 mg/dl to 249 mg/dl: A. Discharge patient. A. Notify HBO physician and await physician orders. B. It is recommended to do blood/urine If result is 250 mg/dl or greater: ketone testing. C. If the result meets the hospital definition of a critical result, follow hospital policy. [name], [name] ([id number]) *Juice or candies are NOT equivalent products. If patient refuses the Glucerna or Ensure, please consult the hospital dietitian for an appropriate substitute. Electronic Signature(s) Signed: 03/18/2022 2:32:14 PM By: Carlene Coria RN Signed: 03/29/2022 12:26:28 PM By: Worthy Keeler PA-C Previous Signature: 02/26/2022 1:06:55 PM Version By: Worthy Keeler PA-C Entered By: Carlene Coria on 03/07/2022 14:24:18 [name]  ([id number]) -------------------------------------------------------------------------------- Problem List Details Patient Name: NICHOLAD, KAUTZMAN. Date of Service: 02/26/2022 11:15 AM Medical Record Number: [id number] Patient Account Number: [id number] Date of Birth/Sex: 03/27/1961 (61 y.o. M) Treating RN: Carlene Coria Primary Care Provider: [name] Other Clinician: Massie Kluver Referring Provider: [name] Treating Provider/Extender: Skipper Cliche in Treatment: 4 Active Problems ICD-10 Encounter Code Description Active Date MDM Diagnosis [id number] Chronic multifocal osteomyelitis, left ankle and foot 01/29/2022 No Yes E11.621 Type 2 diabetes mellitus with foot ulcer 01/29/2022 No Yes L97.524 Non-pressure chronic ulcer of other part of left foot with necrosis of 01/29/2022 No Yes bone T81.31XA Disruption of external operation (surgical) wound, not elsewhere 01/29/2022  No Yes classified, initial encounter Inactive Problems Resolved Problems Electronic Signature(s) Signed: 02/26/2022 10:54:24 AM By: Worthy Keeler PA-C Entered By: Worthy Keeler on 02/26/2022 10:54:24 [name], [name] ([id number]) -------------------------------------------------------------------------------- Progress Note Details Patient Name: [name]. Date of Service: 02/26/2022 11:15 AM Medical Record Number: [id number] Patient Account Number: [id number] Date of Birth/Sex: 01-21-1961 (61 y.o. M) Treating RN: Carlene Coria Primary Care Provider: [name] Other Clinician: Massie Kluver Referring Provider: [name] Treating Provider/Extender: Skipper Cliche in Treatment: 4 Subjective Chief Complaint Information obtained from Patient Left lateral foot ulcer History of Present Illness (HPI) Pleasant 61 year old with history of diabetes (Hgb A1c 10.8 in 2014) and peripheral neuropathy. No PVD. L ABI 1.1. Status post right great toe partial amputation years ago. He was at work and  on 10/22/2014, was injured by a cart, and suffered an ulceration to his left anterior calf. He says that it subsequently became infected, and he was treated with a course of antibiotics. He was found on initial exam to have an ulceration on the dorsum of his left third toe. He was unaware of this and attributes it to pressure from his steel toed boots. More recently he injured his right anterior calf on a cart. Ambulating normally per his baseline. He has been undergoing regular debridements, applying mupirocin cream, and an Ace wrap for edema control. He returns to clinic for follow-up and is without complaints. No pain. No fever or chills. No drainage. 10/25/15; this is a 61 year old man who has type II diabetes with diabetic polyneuropathy. He tells me that he fractured his left fifth metatarsal in June 2016 when he presented with swelling. He does not recall a specific injury. His hemoglobin A1c was apparently too high at the time for consideration of surgery and he was put in some form of offloading. Ultimately he went to surgery in December with an allograft from his calcaneus to this site, plate and screws. He had an x-ray of the foot in March that showed concerns about nonunion. He tells me that in March he had to move and basically moved himself. He was on his foot a lot and then noticed some drainage from an open area. He has been following with his orthopedic surgeon Dr. Doran Durand. He has been applying a felt donut, dry dressing and using his heel healing sandal. 11/01/15; this is a patient I saw last week for the first time (61) (61). He had a small open wound on the plantar aspect of his left foot at roughly the level of the base of his fifth metatarsal. He had a considerable degree of thickened skin around this wound on the plantar aspect which I thought was from chronic pressure on this area. He tells [place] that he had drainage over the course of the week. No systemic symptoms. 11/08/15; culture last week  grew Citrobacter korseri. This should've been sensitive to the Augmentin I gave him. He has seen Dr. Doran Durand who did his initial surgery and according the patient the plan is to give this another month and then the hardware might need to come out of this. This seems like a reasonable plan. I will adjust his antibiotics to ciprofloxacin which probably should continue for at least another 2 weeks. I gave him 10 days worth today 11/22/15 the patient has completed antibiotics. He has an appointment with Dr. Doran Durand this Friday. There is improved dimensions around the wound on the left fifth metatarsal base 11/29/15; the patient has completed antibiotics last week. Apparently his  appointment with Dr. Doran Durand it is not until this Friday. Dimensions are roughly the same. 12/06/15; saw Dr. Doran Durand. No x-ray told the end of the month, next appointment June 30. We have been using Aquacel Ag 12/13/15: No major change this week. Using Aquacel AG 621/17; arrived this week with maceration around the wound. There was quite a bit of undermining which required surgical debridement. I changed him to [facility] last week, by the patient's admission he was up on this more this week 12/27/15; macerated tissue around the wound is removed with a scalpel and pickups. There is no undermining. Nonviable subcutaneous tissue and skin taken from the superior circumference of the wound is slough from the surface. READMISSION 03/06/16 since I last saw this patient at the end of June, he went for surgery on 01/11/16 by Dr. Doran Durand of orthopedics. He had a left foot irrigation and debridement, removal of hardware and placement of wound VAC. He is also been followed by Dr. Megan Salon of infectious disease and completed a six-week course of IV Rocephin for group B strep and Enterobacter in the bone at the time of surgery. Apparently at the time of surgery the bone looked healthy so I don't think any bone was actually removed. He has been using  silver alginate based dressings on the same wound area at the base of the left fifth metatarsal on the left. I note that he is also had arterial studies on 01/08/16, these showed a left ABI of 1.2 to and a right ABI of 1.3. Waveforms were listed as biphasic. He was not felt to have any specific arterial issues. 03/13/16; no real change in the condition of the wound at the left lateral foot at roughly the base of his fifth metatarsal. Use silver alginate last week. 03/18/16 arrives today with no open area. Being suspicious of the overlying callus I. Some of this back although I see nothing but covering tissue here/epithelium. There is no surrounding tenderness READMISSION 04/16/16 this is a patient I discharged about a month ago. Initially a surgical wound on the lateral aspect of his left foot which subsequently became infected. The story he is giving today that he went back into his own modified shoe started to notice pain 2 weeks ago he was seen in Dr. Nona Dell office by a physician assistant last Wednesday and according the patient was told that everything looked fine however this is clearly now broken open and he has an open wound in the same spot that we have been dealing with repetitively. Situation is complicated by the fact that he is running short of money on long-term disability. He has not taken his insulin and at least 2 weeks was previously on NovoLog short acting insulin on a sliding scale and TRESIBA 35 units at bedtime. He is no longer able to afford any of his medication he was in the x-ray on 10/15. A plain x-ray showed healed fracture of the left fifth metatarsal bone status post removal of the associated plate and screw fixation hardware. There was no acute appearing osseous abnormality. His blood work showed a white count of 9.2 with an essentially normal differential comprehensive metabolic panel was normal. Previous CT scan of the foot on 01/08/16 showed no osteomyelitis  previous vascular workup showed no evidence of significant PAD on 01/13/16 [name], [name] ([id number]) 04/23/16; culture I did last week grew enterococcus [ampicillin sensitive] and MRSA. He saw infectious disease yesterday. They stop the clindamycin and ordered an MRI. This is not  unreasonable. All the hardware is out of the foot at this point. 04/30/16 at this point in time we are still awaiting the results of the MRI at this point in time. Patient did have an area which appears to be somewhat macerated in the proximal portion of the wound where there is overlying necrotic skin that is doing nothing more than trapping fluid underneath. He continues to state that he does have some discomfort and again the concern is for the possibility of osteomyelitis hence the reason for the MRI order. We have been using a silver alginate dressing but again I think the reason this with his macerated as it was is that the dressing obscene could not reach the entirety of the wound due to some necrotic skin covering the proximal portion. No bleeding noted at this point in time on initial evaluation. 05/07/16 we receive the results of patient's MRI which shows that he has no evidence of osteomyelitis. This obviously is excellent news. Patient was definitely happy to hear this. He tells me the wound appears to be doing very well at this point in time and is pleased with the progress currently. 05/15/16; as noted the patient did not have osteomyelitis. He has been released by infectious disease and orthopedics. His wound is still open he had a debridement last week but complain when he got home he "bleeding for half a day". He is not had any pain. We have been using silver alginate with Kerlix gauze wrap. 05/28/16; patient arrives today with the wound in much the same condition as last time. He has a small opening on the lateral aspect of his foot however with debridement there is clear undermining medially there is no  real evidence of infection here and I didn't see any point in culturing this. One would have to wonder if this isn't a simple matter of in adequate offloading as he is been using a healing sandal. 06/05/16; the open area is now on the plantar aspect of his foot and not decide. The wound almost appears to have "migrated". This was the term use by our intake nurse. 06/12/16; open area on the plantar aspect of his foot. Base of the wound looks very healthy. This will be his second week in a total contact cast 06/19/16; patient arrived today in a total contact cast. There was some expectation from our staff and myself that this area would be healed. Unfortunately the area was boggy and with rec pressure a fairly substantial amount of purulent drainage was obtained. Specimen obtained for culture. The patient had no complaints of systemic problems including fever or chills or instability of his diabetes. There was no pain in the foot. Nevertheless a extensive debridement was required. 06/26/16; patient's culture from the abscess last week grew a combination of MRSA and ampicillin sensitive enterococcus. I had him on Augmentin and Septra however I have elected to give him a full 10 day course of Zyvox instead as I Recently treated this combination of organisms with Augmentin and Septra before. Arrives today with no systemic symptoms 07/03/16; the patient has 2 more treatments of Zyvox and then he is finished antibiotics the wound has improved now mostly on the lateral aspect of his foot. There is still some tenderness when he walks. 07/10/16; patient arrives today with Zyvox completed. He only has a small open area remaining. 07/24/16; she arrives here with no overt open area. The covering is thick callus/eschar. Nevertheless there is no open area here. He has some  tenderness underneath the area but no overt infection is observed no drainage. The patient has a deformity in the foot with this area weekly to  be exposed to more pressure in his foot where nevertheless that something we are going to have to deal with going forward. The patient has diabetic workboots and diabetic shoes. He has had a long difficult course with the area here. This started as a fracture at work. He had bone grafting from his calcaneus and screws. This got infected he had to have more surgery on the area. Bone at the time of that surgery I think showed enterococcus and group B strep. He had 6 weeks of Rocephin. Since then the area has waxed and waned in its difficulty. Recently he had an MRI in December that did not show osteomyelitis nevertheless he had an abscess that grew MRSA and enterococcus which I elected to treat with Zyvox. This was in December and this wound is actually "healed" over 08/21/16; the patient came in today for his one-month follow-up visit. The area on the lateral aspect of his left foot looks much the same as some month ago. There is no evidence of an open wound here. However the patient tells me about a week after he went back to work he developed severe pain and swelling in the plantar aspect of his right foot first and fifth metatarsal heads. 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 He went to see his podiatrist at [facility] [employer] She told him that he could not work on his feet. She told him to go back and his cam walking boot on the left. Not have open wounds obviously on the right. The patient is actually gone ahead and retired from his job because he does not feel he can work on his feet 09/18/16; patient comes back in saying he recently had some pain on the lateral aspect of his left foot. Asked that we look at this. Other than that all of his wounds have a viable surface. He has diabetic shoes. He is retired from work. READMISSION [name] is a 61 year old man who we cared for for a prolonged period of time in  late 2017 into March 2018. At that point he had suffered a fracture of his left fifth metatarsal at about the level of the base of the fifth metatarsal. He had surgical repair by Dr. Meryl Dare and he developed a very refractory wound over the fifth metatarsal. Ultimately this became infected he required hardware removal this eventually closed over and we discharged from the clinic in March of this year. He tells me as well until roughly 3 weeks ago when he developed increasing pain in the same area making it difficult for him to sleep. He was admitted to hospital from 9/5 through 9/7 with now an ulcer in the same area on the lateral aspect of his left foot at the level of the base of the metatarsal. X-rays and MRIs during this hospitalization were negative. Wound culture showed a combination of Escherichia coli, Morganella and Pseudomonas. He was given IV Cipro and vancomycin the hospital and then discharged home on Cipro and Doxy. He returned to the ER on 9/16 with leg swelling and discomfort. I don't think anything was added at that point although he did have an ultrasound of the left leg that was negative for DVT. He was back in the ER on 10/4 again  with pain in the area he was given Bactrim but states he had a significant allergic reaction to that which has abated once he stopped the Bactrim. I don't think he had a full course. He went to see his podiatrist yesterday who recommended some form of foot soak. He has a Darco forefoot offloading boot The patient is a type II diabetic on insulin. Tells me his recent hemoglobin A1c was 7. Arterial studies done in July 2017 showed an ABI in the right of 1.3 on the left 1.2 to biphasic waveforms bilaterally. He was not felt to have arterial disease. As noted the patient has had 2 MRIs most recently on 10/4 this showed no evidence of an abscess or osteomyelitis and no change since the prior study of 03/05/17 there was nonspecific edema on the dorsum of the  foot. ABIs in this clinic today 1.2 which would be unchanged from his study done in July/17 04/15/17; now down to 1 small wound on the left lateral foot. We wrapped him and applied collagen last week and the foot is fairly macerated. 04/22/17; small wound on the left lateral foot however it has some undermining. Using collagen 04/29/17; small wound on the left lateral foot some surrounding callus. Chronic damage in this area as a result of initial fracture nonhealing and secondary infection. 05/06/17; the patient arrives today with the area on the left lateral foot closed. There is thick subcutaneous tissue with a layer of callus. I took some of the callus off just to ensure adequate closure of the underlying tissue and there is no open wound here. He has considerable deformity of this area of his foot however and unless there is an ability to offload this I think opening is going to be recurrent. He tells me he has diabetic foot wear although I have not actually seen this Readmission: [name], [name] ([id number]) 09/02/17 on evaluation today patient appears to be doing a little better compared to what he tells me what's going on in regard to his left lateral foot. He was previously seen in the ER this past Saturday it is now Tuesday. On Saturday he actually was treated with antibiotics including Cipro 500 mg two times a day and doxycycline 100 mg two times a day. Subsequently he also did have an x-ray performed of his left foot which revealed increased degenerative changes of the base of the fifth metatarsal since comparison in 2018. There was no radiographic evidence of osteomyelitis however. Patient has previously had an MRI of the left foot which was performed on 04/03/17 in this revealed at that point a soft tissue ulceration but no evidence of osteomyelitis. Patient has previously undergone testing in regard to his ABI's by Dr. Bridgett Larsson and this showed that he did have ABI was within normal limits  which does correspond with ABI's we checked here in the office today. That study was on 01/13/16. With all that being said on evaluation today there does not appear to be any evidence of a true open wound of the left lateral fifth metatarsal region. He does have tenderness noted as well is a little bit of boggy/fluctuance feeling to the area in this region of his foot and there is definitely a very solid and firm callous noted laterally. With that being said I am not able to find any obvious opening at this point there does appear to be a spot where there appears to been a opening very recently however patient states he has not noted any  drainage from this currently. He does however have discomfort with palpation of this area this is in the 3-4/10 range only with very firm palpation this is not too significant at all. Subsequently he does have a small ulcer at the medial nail bed of the right second toe where he inadvertently pulled off a portion of the toenail softly causing this injury. Otherwise there does not appear to be any evidence of infection in regard to this right second toe. 09/23/17 on evaluation today patient actually appears to be doing well he has no open ulcerations noted at this point. With that being said he did have significant callous noted over the left lateral foot which did require callous pairing today he tolerated this without complication. Readmission: 11/07/17 patient presents today for readmission concerning to ulcers that he has. One on the right plantar fifth metatarsal region and the other on the left plantar fifth metatarsal region. He has previously had an x-ray which was performed on 11/05/17 and revealed that the patient had wounds noted of the base of the fifth metatarsal region without evidence of destruction to the bone to suggest osteomyelitis. Obviously this is excellent news. With that being said he does have a significantly large ulcer that extensively plantar  surface of the wound and is concerning for the fact that he continues to walk on this due to the patient telling me that he "has to work" I do believe this has likely led to both the opening of the wound as well as the fact that is not really able to heal very well at this point. He has been seen by his podiatrist and apparently according to the note dated 10/23/17 the patient was provided with Santyl and recommended a dry dressing. No wound culture has been obtained at this point. As stated above he has had returned to work due to financial reasons and is not able to wear his postop shoes due to the fact that he cannot wear this and work. The wound is stated to have been present for "several weeks" prior to the visit with the podiatrist on 10/23/17. With that being said again at least the good news is there does not appear to be any evidence of osteomyelitis. 11/14/17 on evaluation today patient appears to be doing poorly in regard to his bilateral foot ulcers. Unfortunately he seems to have significant callous with a lot of maceration underlined especially on the right even more so than the left. He has been tolerating the dressing changes without complication. With that being said overall I do think he's gonna have to have a significant debridement today. 11/21/17 on evaluation today patient presents for follow-up concerning his bilateral lateral foot ulcers. He has been tolerating the dressing changes without complication. His wife helps perform these for him. With that being said the patient states that the left lateral foot ulcer actually is continuing to give him trouble as far as discomfort is concerned. With that being said actually appears better. 11/28/17 on evaluation today patient appears to be doing poorly in regard to his left lower extremity ulcer in particular. His right as made some progress on the lateral portion although the plantar portion is actually getting somewhat worse I think this  is likely due to the fact that he continues to work although it hurts and he's been having a lot of issues out of this side and as far as pain is concerned. With that being said my biggest concern on evaluation today is that  of the patient having issues with increased pain in the left lower extremity ulcer site especially on the plantar aspect. He also appears to have what seems to be an abscess developing in the dorsal/lateral portion of his foot which I think likely is communicating somewhere internally with the ulcer site. Nonetheless he has not had any fevers, chills, nausea, or vomiting noted at this time. He has however had fairly significant increase in pain and he was Artie having a lot of pain previous. Readmission: 01-29-2022 upon evaluation today patient presents for initial inspection here in our clinic concerning the current issue which has been present since around Oct 29, 2021. The patient tells me that he has at this point been seen by Dr. Vickki Muff who has been managing this up to this point. He has also been seen by Dr. Lazaro Arms I will outline the findings there shortly as well. With that being said currently according to Dr. Alvera Singh note on 6-20- 2023 the patient has a prescription for Zosyn that was given by infectious disease. Again that was back in June however. There was a hospital encounter for incision and drainage and debridement of the foot in order to clearway necrotic tissue. The patient also underwent vascular evaluation with Dr. Lucky Cowboy. Nonetheless on the postop visit on 12-18-2021 the patient was seeming to be doing quite well based on what Dr. Vickki Muff said but nonetheless since that time is really not made much progress and is concerned about losing his foot. He therefore requested a referral to wound care to be seen and evaluated and see if there is anything we can do to help and aid with getting this to heal. Fortunately there does not appear to be any evidence of active infection  locally or systemically at this time which is great news. He tells me that he is currently using Betadine wet-to-dry dressings. He does not note any systemic infections but does have evidence of local infection based on what we are seeing. He has had a recent chest x-ray which showed that he had no evidence of acute cardiopulmonary disease that was performed on 12-12-2021. He also underwent vascular intervention with Dr. 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 Subsequently the patient states that Dr. Lucky Cowboy has mentioned that he would need to go back at some time with Dr. Lucky Cowboy said he would consider a posterior tibial approach for revascularization in the future but at this time because of kidney status and the amount of dye used he did not want to proceed any further at that point. Again that something that would likely be scheduled down the road. Nonetheless right now the patient does have an ABI on the left of 0.90 with a TBI of 0.6-0 but this was decreased compared to previous. His ABI on the right was 1.08. Patient did have as well MRI of the left foot which was performed on 12-12-2021 and this shows that he does have interval resection of the proximal third of the fifth metatarsal and area of osteomyelitis on prior MRI. There is persistent ulceration of plantar lateral aspect at the resection site with a sinus tract extending towards the adjacent cuboid bone findings suspicious for osteomyelitis of the plantar lateral aspect of the cuboid and adjacent base of the fourth metatarsal possibly in the proximal most aspect of  the remaining fifth metatarsal shaft as well. The ABI performed on 12-13-2021 showed that the left ABI was 0.83 which had previously been registered at 1.35 and the right ABI was 1.20. I think this is consistent with being able to  heal but obviously would do better with vascular intervention not something that is going to be looked into in the future. This was noted above as well. Based on what I am seeing today I do believe that the patient is definitely a candidate for HBO therapy I think this may be in his best interest as I think he is at the point of being in a limb threatening situation. I do believe that he would benefit from hyperbarics along with continued antibiotics along with good wound care measures as well. 02-04-2022 upon evaluation today patient appears to be doing still poorly in regard to his foot he is having a lot of drainage and he notes odor as well. With that being said we still have not heard anything back from insurance yet as far as approval for HBO therapy. Subsequently the patient does have evidence of osteomyelitis and I think hyperbarics is definitely going to be something that would be beneficial for him. He has been on Takeda, Fabyan F. ([id number]) antibiotics several times previous but unfortunately this just does not seem to be sustained enough we did do a culture it showed Pseudomonas as one of the primary organisms both organisms would be treated with Levaquin which I think is going to be the best way to go. I discussed that with the patient today and I Georgina Peer send that into the pharmacy. 02-11-2022 upon evaluation today patient appears to be doing well currently in regard to his wound. I do feel like the Levaquin is doing better for him which is great news. Fortunately I do not see any evidence of active infection locally or systemically at this time which is great news. No fevers, chills, nausea, vomiting, or diarrhea. 02-19-2022 upon evaluation today patient appears to be doing excellent in regard to his foot ulcer all things considered he still has bone exposed but the good news is we are at least making some progress here I think he does have a lot of odor he tells me and I think switching  over to Dakin's moistened gauze dressing is probably can be the way to go. 02-26-2022 upon evaluation today patient appears to be doing well currently in regard to his wound all things considered. Although the 1 thing I do see is that he is not changing the dressing as frequently as he should be. Fortunately there does not appear to be any signs of active infection he tells me his wife is out of town from Saturday through today so it has not been changed since Saturday before that they have been doing this every other day. I explained that with the Dakin's he needs to do this every day in order to make sure that it heals appropriately. This will also help with the odor which she has been complaining of. Objective Constitutional Well-nourished and well-hydrated in no acute distress. Vitals Time Taken: 11:22 AM, Height: 70 in, Weight: 255 lbs, BMI: 36.6, Temperature: 98.4 F, Pulse: 84 bpm, Respiratory Rate: 18 breaths/min, Blood Pressure: 130/62 mmHg. Respiratory normal breathing without difficulty. Psychiatric this patient is able to make decisions and demonstrates good insight into disease process. Alert and Oriented x 3. pleasant and cooperative. General Notes: Upon inspection patient's wound actually appears to be doing well with  some minimal slough and biofilm noted that wiped off easily. No sharp debridement was necessary today. Integumentary (Hair, Skin) Wound #14 status is Open. Original cause of wound was Blister. The date acquired was: 10/29/2021. The wound has been in treatment 4 weeks. The wound is located on the Left,Lateral Foot. The wound measures 4.2cm length x 6cm width x 0.6cm depth; 19.792cm^2 area and 11.875cm^3 volume. There is bone, muscle, and Fat Layer (Subcutaneous Tissue) exposed. There is a medium amount of sanguinous drainage noted. The wound margin is epibole. There is medium (34-66%) red, pink granulation within the wound bed. There is a medium (34-66%) amount of  necrotic tissue within the wound bed including Adherent Slough. Assessment Active Problems ICD-10 Chronic multifocal osteomyelitis, left ankle and foot Type 2 diabetes mellitus with foot ulcer Non-pressure chronic ulcer of other part of left foot with necrosis of bone Disruption of external operation (surgical) wound, not elsewhere classified, initial encounter Plan Follow-up Appointments: Return Appointment in 1 week. Bathing/ Shower/ Hygiene: [name], [name] ([id number]) May shower with wound dressing protected with water repellent cover or cast protector. Edema Control - Lymphedema / Segmental Compressive Device / Other: Elevate, Exercise Daily and Avoid Standing for Long Periods of Time. Elevate legs to the level of the heart and pump ankles as often as possible Elevate leg(s) parallel to the floor when sitting. Off-Loading: Open toe surgical shoe - left Other: - knee scotter Additional Orders / Instructions: Other: - need daily dressing changes Hyperbaric Oxygen Therapy: Wound #14 Left,Lateral Foot: Evaluate for HBO Therapy Indication and location: - Left foot osteomyelitis If appropriate for treatment, begin HBOT per protocol: 2.0 ATA for 90 Minutes without Air Breaks One treatment per day (delivered Monday through Friday unless otherwise specified in Special Instructions below): Total # of Treatments: - 40 Antihistamine 30 minutes prior to HBO Treatment, difficulty clearing ears. Finger stick Blood Glucose Pre- and Post- HBOT Treatment. Follow Hyperbaric Oxygen Glycemia Protocol The following medication(s) was prescribed: levofloxacin oral 750 mg tablet 1 1 tablet oral taken 1 time per day for 30 days starting 02/26/2022 WOUND #14: - Foot Wound Laterality: Left, Lateral Cleanser: [employer]ram Ancillary Kit - 15 Day Supply (Generic) 3 x Per Week/30 Days Discharge Instructions: Use supplies as instructed; Kit contains: (15) Saline Bullets; (15) 3x3 Gauze; 15 pr Gloves Cleanser:  Soap and Water 3 x Per Week/30 Days Discharge Instructions: Gently cleanse wound with antibacterial soap, rinse and pat dry prior to dressing wounds Cleanser: Wound Cleanser 3 x Per Week/30 Days Discharge Instructions: Wash your hands with soap and water. Remove old dressing, discard into plastic bag and place into trash. Cleanse the wound with Wound Cleanser prior to applying a clean dressing using gauze sponges, not tissues or cotton balls. Do not scrub or use excessive force. Pat dry using gauze sponges, not tissue or cotton balls. Primary Dressing: Gauze 3 x Per Week/30 Days Discharge Instructions: moistened with Dakins Solution Secondary Dressing: ABD Pad 5x9 (in/in) (Generic) 3 x Per Week/30 Days Discharge Instructions: Cover with ABD pad Secured With: Medipore Tape - 29M Medipore H Soft Cloth Surgical Tape, 2x2 (in/yd) (Generic) 3 x Per Week/30 Days Secured With: [employer] Sterile or Non-Sterile 6-ply 4.5x4 (yd/yd) (Generic) 3 x Per Week/30 Days Discharge Instructions: Apply Kerlix as directed 1. I would recommend currently that we going continue with wound care measures as before and the patient is in agreement with plan. This includes the use of the hyperbarics which I think is doing a good job here.  2. I would recommend as well that we continue with using the Dakin's moistened gauze dressing this needs to be changed "changed every day which is going to be the only way that he keeps his clean and from causing a more significant infection. I explained to the patient that change it every few days is not to be sufficient this has to be done every single day. This will also help with the amount of drainage she has been noticing as well. 3. I am good recommend as well that he continue with oral antibiotics I am going to send in a refill for the Levaquin today as well he already has the Flagyl with a refill on it. We will see patient back for reevaluation in 1 week here in the clinic. If  anything worsens or changes patient will contact our office for additional recommendations. Electronic Signature(s) Signed: 02/26/2022 1:07:26 PM By: Worthy Keeler PA-C Entered By: Worthy Keeler on 02/26/2022 13:07:26 [name], [name] ([id number]) -------------------------------------------------------------------------------- SuperBill Details Patient Name: [name]. Date of Service: 02/26/2022 Medical Record Number: [id number] Patient Account Number: [id number] Date of Birth/Sex: December 18, 1960 (61 y.o. M) Treating RN: Carlene Coria Primary Care Provider: [name] Other Clinician: Massie Kluver Referring Provider: [name] Treating Provider/Extender: Skipper Cliche in Treatment: 4 Diagnosis Coding ICD-10 Codes Code Description [phone] Chronic multifocal osteomyelitis, left ankle and foot E11.621 Type 2 diabetes mellitus with foot ulcer L97.524 Non-pressure chronic ulcer of other part of left foot with necrosis of bone T81.31XA Disruption of external operation (surgical) wound, not elsewhere classified, initial encounter Facility Procedures CPT4 Code: [id number] Description: 99213 - WOUND CARE VISIT-LEV 3 EST PT Modifier: Quantity: 1 Physician Procedures CPT4 Code: [id number] Description: 99213 - WC PHYS LEVEL 3 - EST PT Modifier: Quantity: 1 CPT4 Code: Description: ICD-10 Diagnosis Description M86.372 Chronic multifocal osteomyelitis, left ankle and foot E11.621 Type 2 diabetes mellitus with foot ulcer L97.524 Non-pressure chronic ulcer of other part of left foot with necrosis of bo T81.31XA Disruption  of external operation (surgical) wound, not elsewhere classifi Modifier: ne ed, initial encounter Quantity: Electronic Signature(s) Signed: 02/26/2022 1:09:06 PM By: Worthy Keeler PA-C Entered By: Worthy Keeler on 02/26/2022 13:09:06

## 2022-02-26 NOTE — Progress Notes (Addendum)
[name], [name] ([id number]) Visit Report for 02/26/2022 Arrival Information Details Patient Name: [name], [name]. Date of Service: 02/26/2022 11:15 AM Medical Record Number: [id number] Patient Account Number: [id number] Date of Birth/Sex: Oct 19, 1960 (61 y.o. M) Treating RN: Carlene Coria Primary Care Diani Jillson: [name] Other Clinician: Massie Kluver Referring Carmichael Burdette: [name] Treating Zollie Ellery/Extender: Skipper Cliche in Treatment: 4 Visit Information History Since Last Visit All ordered tests and consults were completed: No Patient Arrived: Knee Scooter Added or deleted any medications: No Arrival Time: 11:14 Any new allergies or adverse reactions: No Transfer Assistance: None Had a fall or experienced change in No Patient Requires Transmission-Based Precautions: No activities of daily living that may affect Patient Has Alerts: No risk of falls: Hospitalized since last visit: No Pain Present Now: Yes Electronic Signature(s) Signed: 02/26/2022 2:32:39 PM By: Massie Kluver Entered By: Massie Kluver on 02/26/2022 11:21:16 [name] ([id number]) -------------------------------------------------------------------------------- Clinic Level of Care Assessment Details Patient Name: [name]. Date of Service: 02/26/2022 11:15 AM Medical Record Number: [id number] Patient Account Number: [id number] Date of Birth/Sex: 10-Jan-1961 (61 y.o. M) Treating RN: Carlene Coria Primary Care Setsuko Robins: [name] Other Clinician: Massie Kluver Referring Lyndsi Altic: [name] Treating Dodi Leu/Extender: Skipper Cliche in Treatment: 4 Clinic Level of Care Assessment Items TOOL 4 Quantity Score '[]'$  - Use when only an EandM is performed on FOLLOW-UP visit 0 ASSESSMENTS - Nursing Assessment / Reassessment X - Reassessment of Co-morbidities (includes updates in patient status) 1 10 X- 1 5 Reassessment of Adherence to Treatment Plan ASSESSMENTS - Wound and Skin  Assessment / Reassessment X - Simple Wound Assessment / Reassessment - one wound 1 5 '[]'$  - 0 Complex Wound Assessment / Reassessment - multiple wounds '[]'$  - 0 Dermatologic / Skin Assessment (not related to wound area) ASSESSMENTS - Focused Assessment '[]'$  - Circumferential Edema Measurements - multi extremities 0 '[]'$  - 0 Nutritional Assessment / Counseling / Intervention '[]'$  - 0 Lower Extremity Assessment (monofilament, tuning fork, pulses) '[]'$  - 0 Peripheral Arterial Disease Assessment (using hand held doppler) ASSESSMENTS - Ostomy and/or Continence Assessment and Care '[]'$  - Incontinence Assessment and Management 0 '[]'$  - 0 Ostomy Care Assessment and Management (repouching, etc.) PROCESS - Coordination of Care X - Simple Patient / Family Education for ongoing care 1 15 '[]'$  - 0 Complex (extensive) Patient / Family Education for ongoing care '[]'$  - 0 Staff obtains Programmer, systems, Records, Test Results / Process Orders '[]'$  - 0 Staff telephones HHA, Nursing Homes / Clarify orders / etc '[]'$  - 0 Routine Transfer to another Facility (non-emergent condition) '[]'$  - 0 Routine Hospital Admission (non-emergent condition) '[]'$  - 0 New Admissions / Biomedical engineer / Ordering NPWT, Apligraf, etc. '[]'$  - 0 Emergency Hospital Admission (emergent condition) X- 1 10 Simple Discharge Coordination '[]'$  - 0 Complex (extensive) Discharge Coordination PROCESS - Special Needs '[]'$  - Pediatric / Minor Patient Management 0 '[]'$  - 0 Isolation Patient Management '[]'$  - 0 Hearing / Language / Visual special needs '[]'$  - 0 Assessment of Community assistance (transportation, D/C planning, etc.) '[]'$  - 0 Additional assistance / Altered mentation '[]'$  - 0 Support Surface(s) Assessment (bed, cushion, seat, etc.) INTERVENTIONS - Wound Cleansing / Measurement BERNABE, DORCE. ([id number]) X- 1 5 Simple Wound Cleansing - one wound '[]'$  - 0 Complex Wound Cleansing - multiple wounds X- 1 5 Wound Imaging (photographs - any number of  wounds) '[]'$  - 0 Wound Tracing (instead of photographs) X- 1 5 Simple Wound Measurement - one wound '[]'$  - 0 Complex Wound  Measurement - multiple wounds INTERVENTIONS - Wound Dressings '[]'$  - Small Wound Dressing one or multiple wounds 0 X- 1 15 Medium Wound Dressing one or multiple wounds '[]'$  - 0 Large Wound Dressing one or multiple wounds '[]'$  - 0 Application of Medications - topical '[]'$  - 0 Application of Medications - injection INTERVENTIONS - Miscellaneous '[]'$  - External ear exam 0 '[]'$  - 0 Specimen Collection (cultures, biopsies, blood, body fluids, etc.) '[]'$  - 0 Specimen(s) / Culture(s) sent or taken to Lab for analysis '[]'$  - 0 Patient Transfer (multiple staff / Civil Service fast streamer / Similar devices) '[]'$  - 0 Simple Staple / Suture removal (25 or less) '[]'$  - 0 Complex Staple / Suture removal (26 or more) '[]'$  - 0 Hypo / Hyperglycemic Management (close monitor of Blood Glucose) '[]'$  - 0 Ankle / Brachial Index (ABI) - do not check if billed separately X- 1 5 Vital Signs Has the patient been seen at the hospital within the last three years: Yes Total Score: 80 Level Of Care: New/Established - Level 3 Electronic Signature(s) Signed: 02/26/2022 2:32:39 PM By: Massie Kluver Entered By: Massie Kluver on 02/26/2022 11:49:50 [name] ([id number]) -------------------------------------------------------------------------------- Encounter Discharge Information Details Patient Name: [name]. Date of Service: 02/26/2022 11:15 AM Medical Record Number: [id number] Patient Account Number: [id number] Date of Birth/Sex: [birth year] (61 y.o. M) Treating RN: Carlene Coria Primary Care Toneshia Coello: [name] Other Clinician: Massie Kluver Referring Avanish Cerullo: [name] Treating Kyoko Elsea/Extender: Skipper Cliche in Treatment: 4 Encounter Discharge Information Items Discharge Condition: Stable Ambulatory Status: Knee Scooter Discharge Destination: Home Transportation: Private  Auto Accompanied By: self Schedule Follow-up Appointment: Yes Clinical Summary of Care: Electronic Signature(s) Signed: 02/26/2022 2:32:39 PM By: Massie Kluver Entered By: Massie Kluver on 02/26/2022 12:02:01 [name] ([id number]) -------------------------------------------------------------------------------- Lower Extremity Assessment Details Patient Name: MARLOW, BERENGUER. Date of Service: 02/26/2022 11:15 AM Medical Record Number: [id number] Patient Account Number: [id number] Date of Birth/Sex: [birth year] (61 y.o. M) Treating RN: Carlene Coria Primary Care Legrand Lasser: [name] Other Clinician: Massie Kluver Referring Jailani Hogans: [name] Treating Alam Guterrez/Extender: Skipper Cliche in Treatment: 4 Electronic Signature(s) Signed: 02/26/2022 2:32:39 PM By: Massie Kluver Signed: 02/28/2022 8:44:03 PM By: Carlene Coria RN Entered By: Massie Kluver on 02/26/2022 11:35:20 NISSAN, FRAZZINI ([id number]) -------------------------------------------------------------------------------- Multi Wound Chart Details Patient Name: AMERY, MINASYAN. Date of Service: 02/26/2022 11:15 AM Medical Record Number: [id number] Patient Account Number: [id number] Date of Birth/Sex: February 22, 1961 (61 y.o. M) Treating RN: Carlene Coria Primary Care Latonga Ponder: [name] Other Clinician: Massie Kluver Referring Kadarious Dikes: [name] Treating Jie Stickels/Extender: Skipper Cliche in Treatment: 4 Vital Signs Height(in): 70 Pulse(bpm): 75 Weight(lbs): 255 Blood Pressure(mmHg): 130/62 Body Mass Index(BMI): 36.6 Temperature(F): 98.4 Respiratory Rate(breaths/min): 18 Photos: [N/A:N/A] Wound Location: Left, Lateral Foot N/A N/A Wounding Event: Blister N/A N/A Primary Etiology: Diabetic Wound/Ulcer of the Lower N/A N/A Extremity Secondary Etiology: Dehisced Wound N/A N/A Comorbid History: Hypertension, Type II Diabetes, N/A N/A Neuropathy Date Acquired: 10/29/2021 N/A N/A Weeks of  Treatment: 4 N/A N/A Wound Status: Open N/A N/A Wound Recurrence: No N/A N/A Pending Amputation on Yes N/A N/A Presentation: Measurements L x W x D (cm) 4.2x6x0.6 N/A N/A Area (cm) : 19.792 N/A N/A Volume (cm) : 11.875 N/A N/A % Reduction in Area: -83.30% N/A N/A % Reduction in Volume: 56.00% N/A N/A Classification: Grade 3 N/A N/A Exudate Amount: Medium N/A N/A Exudate Type: Sanguinous N/A N/A Exudate Color: red N/A N/A Wound Margin: Epibole N/A N/A Granulation Amount: Medium (34-66%) N/A N/A Granulation Quality:  Red, Pink N/A N/A Necrotic Amount: Medium (34-66%) N/A N/A Exposed Structures: Fat Layer (Subcutaneous Tissue): N/A N/A Yes Muscle: Yes Bone: Yes Fascia: No Tendon: No Joint: No Epithelialization: None N/A N/A Treatment Notes Electronic Signature(s) Signed: 02/26/2022 2:32:39 PM By: Willa Frater ([id number]) Entered By: Massie Kluver on 02/26/2022 11:35:37 [name] ([id number]) -------------------------------------------------------------------------------- [place] Details Patient Name: KWABENA, STRUTZ. Date of Service: 02/26/2022 11:15 AM Medical Record Number: [id number] Patient Account Number: [id number] Date of Birth/Sex: Jun 30, 1961 (61 y.o. M) Treating RN: Carlene Coria Primary Care Glenyce Randle: [name] Other Clinician: Massie Kluver Referring Ineta Sinning: [name] Treating Shresta Risden/Extender: Skipper Cliche in Treatment: 4 Active Inactive HBO Nursing Diagnoses: Anxiety related to feelings of confinement associated with the hyperbaric oxygen chamber Anxiety related to knowledge deficit of hyperbaric oxygen therapy and treatment procedures Discomfort related to temperature and humidity changes inside hyperbaric chamber Potential for barotraumas to ears, sinuses, teeth, and lungs or cerebral gas embolism related to changes in atmospheric pressure inside hyperbaric oxygen chamber Potential for  oxygen toxicity seizures related to delivery of 100% oxygen at an increased atmospheric pressure Potential for pulmonary oxygen toxicity related to delivery of 100% oxygen at an increased atmospheric pressure Goals: Barotrauma will be prevented during HBO2 Date Initiated: 02/11/2022 Target Resolution Date: 02/11/2022 Goal Status: Active Patient and/or family will be able to state/discuss factors appropriate to the management of their disease process during treatment Date Initiated: 02/11/2022 Target Resolution Date: 02/11/2022 Goal Status: Active Patient will tolerate the hyperbaric oxygen therapy treatment Date Initiated: 02/11/2022 Target Resolution Date: 02/11/2022 Goal Status: Active Patient will tolerate the internal climate of the chamber Date Initiated: 02/11/2022 Target Resolution Date: 02/12/2022 Goal Status: Active Patient/caregiver will verbalize understanding of HBO goals, rationale, procedures and potential hazards Date Initiated: 02/11/2022 Target Resolution Date: 02/11/2022 Goal Status: Active Signs and symptoms of pulmonary oxygen toxicity will be recognized and promptly addressed Date Initiated: 02/11/2022 Target Resolution Date: 02/11/2022 Goal Status: Active Signs and symptoms of seizure will be recognized and promptly addressed ; seizing patients will suffer no harm Date Initiated: 02/11/2022 Target Resolution Date: 02/11/2022 Goal Status: Active Interventions: Administer a five (5) minute air break for patient if signs and symptoms of seizure appear and notify the hyperbaric physician Administer decongestants, per physician orders, prior to HBO2 Administer the correct therapeutic gas delivery based on the patients needs and limitations, per physician order Assess and provide for patientos comfort related to the hyperbaric environment and equalization of middle ear Assess for signs and symptoms related to adverse events, including but not limited to confinement anxiety,  pneumothorax, oxygen toxicity and baurotrauma Assess patient for any history of confinement anxiety Assess patient's knowledge and expectations regarding hyperbaric medicine and provide education related to the hyperbaric environment, goals of treatment and prevention of adverse events Implement protocols to decrease risk of pneumothorax in high risk patients Notes: Necrotic Tissue Nursing Diagnoses: Impaired tissue integrity related to necrotic/devitalized tissue BENTLEE, BENNINGFIELD ([id number]) Knowledge deficit related to management of necrotic/devitalized tissue Goals: Necrotic/devitalized tissue will be minimized in the wound bed Date Initiated: 02/11/2022 Target Resolution Date: 02/12/2022 Goal Status: Active Patient/caregiver will verbalize understanding of reason and process for debridement of necrotic tissue Date Initiated: 02/11/2022 Target Resolution Date: 02/11/2022 Goal Status: Active Interventions: Assess patient pain level pre-, during and post procedure and prior to discharge Provide education on necrotic tissue and debridement process Treatment Activities: Apply topical anesthetic as ordered : 02/11/2022 Enzymatic debridement : 02/11/2022 Excisional debridement : 02/11/2022 Notes:  Wound/Skin Impairment Nursing Diagnoses: Knowledge deficit related to ulceration/compromised skin integrity Goals: Patient/caregiver will verbalize understanding of skin care regimen Date Initiated: 01/29/2022 Target Resolution Date: 03/01/2022 Goal Status: Active Ulcer/skin breakdown will have a volume reduction of 30% by week 4 Date Initiated: 01/29/2022 Target Resolution Date: 03/01/2022 Goal Status: Active Ulcer/skin breakdown will have a volume reduction of 50% by week 8 Date Initiated: 01/29/2022 Target Resolution Date: 03/31/2022 Goal Status: Active Ulcer/skin breakdown will have a volume reduction of 80% by week 12 Date Initiated: 01/29/2022 Target Resolution Date: 05/01/2022 Goal Status:  Active Ulcer/skin breakdown will heal within 14 weeks Date Initiated: 01/29/2022 Target Resolution Date: 05/01/2022 Goal Status: Active Interventions: Assess patient/caregiver ability to obtain necessary supplies Assess patient/caregiver ability to perform ulcer/skin care regimen upon admission and as needed Assess ulceration(s) every visit Notes: Electronic Signature(s) Signed: 02/26/2022 2:32:39 PM By: Massie Kluver Signed: 02/28/2022 8:44:03 PM By: Carlene Coria RN Entered By: Massie Kluver on 02/26/2022 11:35:29 [name] ([id number]) -------------------------------------------------------------------------------- Pain Assessment Details Patient Name: [name]. Date of Service: 02/26/2022 11:15 AM Medical Record Number: [id number] Patient Account Number: [id number] Date of Birth/Sex: 12-16-1960 (61 y.o. M) Treating RN: Carlene Coria Primary Care Flay Ghosh: [name] Other Clinician: Massie Kluver Referring Adela Esteban: [name] Treating Scout Guyett/Extender: Skipper Cliche in Treatment: 4 Active Problems Location of Pain Severity and Description of Pain Patient Has Paino Yes Site Locations Pain Location: Pain in Ulcers Duration of the Pain. Constant / Intermittento Constant Rate the pain. Current Pain Level: 7 Character of Pain Describe the Pain: Aching, Burning Pain Management and Medication Current Pain Management: Medication: Yes Rest: Yes How does your wound impact your activities of daily livingo Sleep: Yes Electronic Signature(s) Signed: 02/26/2022 2:32:39 PM By: Massie Kluver Signed: 02/28/2022 8:44:03 PM By: Carlene Coria RN Entered By: Massie Kluver on 02/26/2022 11:26:29 [name] ([id number]) -------------------------------------------------------------------------------- Patient/Caregiver Education Details Patient Name: TYREN, DUGAR. Date of Service: 02/26/2022 11:15 AM Medical Record Number: [id number] Patient Account Number:  [id number] Date of Birth/Gender: Nov 17, 1960 (61 y.o. M) Treating RN: Carlene Coria Primary Care Physician: [name] Other Clinician: Massie Kluver Referring Physician: [name] Treating Physician/Extender: Skipper Cliche in Treatment: 4 Education Assessment Education Provided To: Patient Education Topics Provided Wound/Skin Impairment: Handouts: Other: continue wound care as directed Methods: Explain/Verbal Responses: State content correctly Electronic Signature(s) Signed: 02/26/2022 2:32:39 PM By: Massie Kluver Entered By: Massie Kluver on 02/26/2022 11:50:17 [name] ([id number]) -------------------------------------------------------------------------------- Wound Assessment Details Patient Name: [name]. Date of Service: 02/26/2022 11:15 AM Medical Record Number: [id number] Patient Account Number: [id number] Date of Birth/Sex: 06/29/1961 (61 y.o. M) Treating RN: Carlene Coria Primary Care Briyanna Billingham: [name] Other Clinician: Massie Kluver Referring Malai Lady: [name] Treating Brelyn Woehl/Extender: Skipper Cliche in Treatment: 4 Wound Status Wound Number: [id number] Primary Etiology: Diabetic Wound/Ulcer of the Lower Extremity Wound Location: Left, Lateral Foot Secondary Etiology: Dehisced Wound Wounding Event: Blister Wound Status: Open Date Acquired: 10/29/2021 Comorbid History: Hypertension, Type II Diabetes, Neuropathy Weeks Of Treatment: 4 Clustered Wound: No Pending Amputation On Presentation Photos Wound Measurements Length: (cm) 4.2 Width: (cm) 6 Depth: (cm) 0.6 Area: (cm) 19.792 Volume: (cm) 11.875 % Reduction in Area: -83.3% % Reduction in Volume: 56% Epithelialization: None Wound Description Classification: Grade 3 Wound Margin: Epibole Exudate Amount: Medium Exudate Type: Sanguinous Exudate Color: red Foul Odor After Cleansing: No Slough/Fibrino Yes Wound Bed Granulation Amount: Medium (34-66%) Exposed  Structure Granulation Quality: Red, Pink Fascia Exposed: No Necrotic Amount: Medium (34-66%) Fat Layer (Subcutaneous Tissue)  Exposed: Yes Necrotic Quality: Adherent Slough Tendon Exposed: No Muscle Exposed: Yes Necrosis of Muscle: No Joint Exposed: No Bone Exposed: Yes Electronic Signature(s) Signed: 02/26/2022 2:32:39 PM By: Massie Kluver Signed: 02/28/2022 8:44:03 PM By: Carlene Coria RN Entered By: Massie Kluver on 02/26/2022 11:32:37 [name] ([id number]) -------------------------------------------------------------------------------- [place] Details Patient Name: [name]. Date of Service: 02/26/2022 11:15 AM Medical Record Number: [id number] Patient Account Number: [id number] Date of Birth/Sex: October 04, 1960 (61 y.o. M) Treating RN: Carlene Coria Primary Care Kecia Swoboda: [name] Other Clinician: Massie Kluver Referring Alaric Gladwin: [name] Treating Elanor Cale/Extender: Skipper Cliche in Treatment: 4 Vital Signs Time Taken: 11:22 Temperature (F): 98.4 Height (in): 70 Pulse (bpm): 84 Weight (lbs): 255 Respiratory Rate (breaths/min): 18 Body Mass Index (BMI): 36.6 Blood Pressure (mmHg): 130/62 Reference Range: 80 - 120 mg / dl Electronic Signature(s) Signed: 02/26/2022 2:32:39 PM By: Massie Kluver Entered By: Massie Kluver on 02/26/2022 11:26:22

## 2022-02-26 NOTE — Progress Notes (Signed)
AWAIS, COBARRUBIAS ([id number]) Visit Report for 02/26/2022 Arrival Information Details Patient Name: [name], [name]. Date of Service: 02/26/2022 8:00 AM Medical Record Number: [id number] Patient Account Number: [id number] Date of Birth/Sex: 1960-10-31 (61 y.o. M) Treating RN: Cornell Barman Primary Care Gery Sabedra: [name] Other Clinician: [name] Referring Chalsey Leeth: [name] Treating Prestyn Mahn/Extender: Skipper Cliche in Treatment: 4 Visit Information History Since Last Visit Added or deleted any medications: No Patient Arrived: Knee Scooter Any new allergies or adverse reactions: No Arrival Time: 08:36 Had a fall or experienced change in No Accompanied By: self activities of daily living that may affect Transfer Assistance: None risk of falls: Patient Identification Verified: Yes Signs or symptoms of abuse/neglect since last visito No Secondary Verification Process Completed: Yes Hospitalized since last visit: No Patient Requires Transmission-Based Precautions: No Implantable device outside of the clinic excluding No Patient Has Alerts: No cellular tissue based products placed in the center since last visit: Has Dressing in Place as Prescribed: Yes Has Footwear/Offloading in Place as Prescribed: Yes Left: Surgical Shoe with Pressure Relief Insole Pain Present Now: Yes Electronic Signature(s) Signed: 02/26/2022 10:57:29 AM By: [name] RCP, RRT, CHT Entered By: [name] on 02/26/2022 08:38:01 [name] ([id number]) -------------------------------------------------------------------------------- Encounter Discharge Information Details Patient Name: [name], [name]. Date of Service: 02/26/2022 8:00 AM Medical Record Number: [id number] Patient Account Number: [id number] Date of Birth/Sex: 1961-03-18 (61 y.o. M) Treating RN: Cornell Barman Primary Care Augustino Savastano: [name] Other Clinician: [name] Referring  [name] Wahlberg: [name] Treating Nowell Sites/Extender: Skipper Cliche in Treatment: 4 Encounter Discharge Information Items Discharge Condition: Stable Ambulatory Status: Knee Scooter Discharge Destination: Home Transportation: Private Auto Accompanied By: self Schedule Follow-up Appointment: Yes Clinical Summary of Care: Notes Patient has an HBO treatment scheduled on 8/30 23 at 08:00 am. Electronic Signature(s) Signed: 02/26/2022 10:57:29 AM By: [name] RCP, RRT, CHT Entered By: [name] on 02/26/2022 10:44:04 [name] ([id number]) -------------------------------------------------------------------------------- [place] Details Patient Name: [name]. Date of Service: 02/26/2022 8:00 AM Medical Record Number: [id number] Patient Account Number: [id number] Date of Birth/Sex: Sep 21, 1960 (61 y.o. M) Treating RN: Cornell Barman Primary Care Elliet Goodnow: [name] Other Clinician: [name] Referring Irven Ingalsbe: [name] Treating Jodeci Roarty/Extender: Skipper Cliche in Treatment: 4 Vital Signs Time Taken: 08:08 Temperature (F): 98.4 Height (in): 70 Pulse (bpm): 84 Weight (lbs): 255 Respiratory Rate (breaths/min): 16 Body Mass Index (BMI): 36.6 Blood Pressure (mmHg): 130/62 Capillary Blood Glucose (mg/dl): 128 Reference Range: 80 - 120 mg / dl Electronic Signature(s) Signed: 02/26/2022 10:57:29 AM By: [name] RCP, RRT, CHT Entered By: [name] on 02/26/2022 08:47:07

## 2022-02-27 ENCOUNTER — Encounter (HOSPITAL_BASED_OUTPATIENT_CLINIC_OR_DEPARTMENT_OTHER): Payer: 59 | Admitting: Internal Medicine

## 2022-02-27 DIAGNOSIS — M86372 Chronic multifocal osteomyelitis, left ankle and foot: Secondary | ICD-10-CM | POA: Diagnosis not present

## 2022-02-27 DIAGNOSIS — E11621 Type 2 diabetes mellitus with foot ulcer: Secondary | ICD-10-CM | POA: Diagnosis not present

## 2022-02-27 DIAGNOSIS — L97524 Non-pressure chronic ulcer of other part of left foot with necrosis of bone: Secondary | ICD-10-CM

## 2022-02-27 LAB — GLUCOSE, CAPILLARY: Glucose-Capillary: 176 mg/dL — ABNORMAL HIGH (ref 70–99)

## 2022-02-27 NOTE — Progress Notes (Signed)
GARETT, TETZLOFF ([id number]) Visit Report for 02/27/2022 Arrival Information Details Patient Name: [name], [name]. Date of Service: 02/27/2022 8:00 AM Medical Record Number: [id number] Patient Account Number: [id number] Date of Birth/Sex: January 31, 1961 (61 y.o. M) Treating RN: Cornell Barman Primary Care Jamey Harman: [name] Other Clinician: [name] Referring Hayleigh Bawa: [name] Treating Stonewall Doss/Extender: Yaakov Guthrie in Treatment: 4 Visit Information History Since Last Visit Added or deleted any medications: No Patient Arrived: Knee Scooter Any new allergies or adverse reactions: No Arrival Time: 09:54 Had a fall or experienced change in No Accompanied By: self activities of daily living that may affect Transfer Assistance: None risk of falls: Patient Identification Verified: Yes Signs or symptoms of abuse/neglect since last visito No Secondary Verification Process Completed: Yes Hospitalized since last visit: No Patient Requires Transmission-Based Precautions: No Implantable device outside of the clinic excluding No Patient Has Alerts: No cellular tissue based products placed in the center since last visit: Has Dressing in Place as Prescribed: Yes Has Footwear/Offloading in Place as Prescribed: Yes Left: Surgical Shoe with Pressure Relief Insole Pain Present Now: Yes Electronic Signature(s) Signed: 02/27/2022 1:42:01 PM By: [name] RCP, RRT, CHT Entered By: [name] on 02/27/2022 09:55:07 [name] ([id number]) -------------------------------------------------------------------------------- Encounter Discharge Information Details Patient Name: [name]. Date of Service: 02/27/2022 8:00 AM Medical Record Number: [id number] Patient Account Number: [id number] Date of Birth/Sex: 22-Jun-1961 (61 y.o. M) Treating RN: Cornell Barman Primary Care Travaughn Vue: [name] Other Clinician: [name] Referring  Maxamilian Amadon: [name] Treating Diontae Route/Extender: Yaakov Guthrie in Treatment: 4 Encounter Discharge Information Items Discharge Condition: Stable Ambulatory Status: Knee Scooter Discharge Destination: Home Transportation: Private Auto Accompanied By: self Schedule Follow-up Appointment: Yes Clinical Summary of Care: Notes Patient has an HBO treatment scheduled on 02/28/22 at 08:00 am. Electronic Signature(s) Signed: 02/27/2022 1:42:01 PM By: [name] RCP, RRT, CHT Entered By: [name] on 02/27/2022 13:31:02 [name] ([id number]) -------------------------------------------------------------------------------- Vitals Details Patient Name: [name]. Date of Service: 02/27/2022 8:00 AM Medical Record Number: [id number] Patient Account Number: [id number] Date of Birth/Sex: August 09, 1960 (61 y.o. M) Treating RN: Cornell Barman Primary Care Mariadelcarmen Corella: [name] Other Clinician: [name] Referring Nylan Nakatani: [name] Treating Enjoli Tidd/Extender: Yaakov Guthrie in Treatment: 4 Vital Signs Time Taken: 08:05 Temperature (F): 98.0 Height (in): 70 Pulse (bpm): 84 Weight (lbs): 255 Respiratory Rate (breaths/min): 16 Body Mass Index (BMI): 36.6 Blood Pressure (mmHg): 122/64 Capillary Blood Glucose (mg/dl): 176 Reference Range: 80 - 120 mg / dl Electronic Signature(s) Signed: 02/27/2022 1:42:01 PM By: [name] RCP, RRT, CHT Entered By: [name] on 02/27/2022 09:55:47

## 2022-02-27 NOTE — Progress Notes (Signed)
[name], [name] ([id number]) Visit Report for 02/27/2022 HBO Details Patient Name: [name], [name]. Date of Service: 02/27/2022 8:00 AM Medical Record Number: [id number] Patient Account Number: [id number] Date of Birth/Sex: 1960/09/28 (61 y.o. M) Treating RN: Cornell Barman Primary Care Marque Bango: [name] Other Clinician: [name] Referring Amritha Yorke: [name] Treating Igor Bishop/Extender: Yaakov Guthrie in Treatment: 4 HBO Treatment Course Details Treatment Course Number: 1 Ordering Kedarius Aloisi: [name] Total Treatments Ordered: 40 HBO Treatment Start Date: 02/18/2022 HBO Indication: Chronic Refractory Osteomyelitis to Left Ankle and Foot HBO Treatment Details Treatment Number: 7 Patient Type: Outpatient Chamber Type: Monoplace Chamber Serial #: [id number] Treatment Protocol: 2.0 ATA with 90 minutes oxygen, and no air breaks Treatment Details Compression Rate Down: 1.5 psi / minute De-Compression Rate Up: 1.5 psi / minute Compress Tx Pressure Air breaks and breathing periods Decompress Decompress Begins Reached (leave unused spaces blank) Begins Ends Chamber Pressure (ATA) 1 2 - - - - - - 2 1 Clock Time (24 hr) 08:33 08:45 - - - - - - 10:15 10:26 Treatment Length: 113 (minutes) Treatment Segments: 4 Vital Signs Capillary Blood Glucose Reference Range: 80 - 120 mg / dl HBO Diabetic Blood Glucose Intervention Range: <131 mg/dl or >249 mg/dl Time Vitals Blood Respiratory Capillary Blood Glucose Pulse Action Type: Pulse: Temperature: Taken: Pressure: Rate: Glucose (mg/dl): Meter #: Oximetry (%) Taken: Pre 08:05 122/64 84 16 98 176 1 none per protocol Post 10:46 132/62 84 16 97.8 170 1 none perprotocol Treatment Response Treatment Toleration: Well Treatment Completion Treatment Completed without Adverse Event Status: HBO Attestation I certify that I supervised this HBO treatment in accordance with Medicare guidelines. A trained emergency response team  is readily Yes available per hospital policies and procedures. Continue HBOT as ordered. Yes Electronic Signature(s) Signed: 02/27/2022 3:18:05 PM By: Kalman Shan DO Previous Signature: 02/27/2022 1:42:01 PM Version By: [name] RCP, RRT, CHT Entered By: Kalman Shan on 02/27/2022 15:15:54 [name] ([id number]) -------------------------------------------------------------------------------- HBO Safety Checklist Details Patient Name: [name]. Date of Service: 02/27/2022 8:00 AM Medical Record Number: [id number] Patient Account Number: [id number] Date of Birth/Sex: Mar 26, 1961 (61 y.o. M) Treating RN: Cornell Barman Primary Care Jessina Marse: [name] Other Clinician: [name] Referring Stacee Earp: [name] Treating Zekiah Coen/Extender: Yaakov Guthrie in Treatment: 4 HBO Safety Checklist Items Safety Checklist Consent Form Signed Patient voided / foley secured and emptied When did you last eato 07:00 am Last dose of injectable or oral agent 02/26/22 pm Ostomy pouch emptied and vented if applicable NA All implantable devices assessed, documented and approved NA Intravenous access site secured and place NA Valuables secured Linens and cotton and cotton/polyester blend (less than 51% polyester) Personal oil-based products / skin lotions / body lotions removed Wigs or hairpieces removed NA Smoking or tobacco materials removed NA Books / newspapers / magazines / loose paper removed NA Cologne, aftershave, perfume and deodorant removed Jewelry removed (may wrap wedding band) Make-up removed NA Hair care products removed Battery operated devices (external) removed NA Heating patches and chemical warmers removed NA Titanium eyewear removed NA Nail polish cured greater than 10 hours NA Casting material cured greater than 10 hours NA Hearing aids removed NA Loose dentures or partials removed NA Prosthetics have been  removed NA Patient demonstrates correct use of air break device (if applicable) Patient concerns have been addressed Patient grounding bracelet on and cord attached to chamber Specifics for Inpatients (complete in addition to above) Medication sheet sent with patient Intravenous medications needed or  due during therapy sent with patient Drainage tubes (e.g. nasogastric tube or chest tube secured and vented) Endotracheal or Tracheotomy tube secured Cuff deflated of air and inflated with saline Airway suctioned Electronic Signature(s) Signed: 02/27/2022 1:42:01 PM By: [name] RCP, RRT, CHT Entered By: [name] on 02/27/2022 10:07:39

## 2022-02-28 ENCOUNTER — Encounter: Payer: 59 | Admitting: Physician Assistant

## 2022-02-28 DIAGNOSIS — M86372 Chronic multifocal osteomyelitis, left ankle and foot: Secondary | ICD-10-CM | POA: Diagnosis not present

## 2022-02-28 LAB — GLUCOSE, CAPILLARY
Glucose-Capillary: 207 mg/dL — ABNORMAL HIGH (ref 70–99)
Glucose-Capillary: 241 mg/dL — ABNORMAL HIGH (ref 70–99)

## 2022-02-28 NOTE — Progress Notes (Signed)
ALEXY, HELDT ([id number]) Visit Report for 02/28/2022 Arrival Information Details Patient Name: [name], [name] Date of Service: 02/28/2022 8:00 AM Medical Record Number: [id number] Patient Account Number: [id number] Date of Birth/Sex: February 07, 1961 (61 y.o. M) Treating RN: Cornell Barman Primary Care Danyela Posas: [name] Other Clinician: [name] Referring Nakeisha Greenhouse: [name] Treating Suzanne Kho/Extender: Skipper Cliche in Treatment: 4 Visit Information History Since Last Visit Added or deleted any medications: No Patient Arrived: Knee Scooter Any new allergies or adverse reactions: No Arrival Time: 08:00 Had a fall or experienced change in No Accompanied By: self activities of daily living that may affect Transfer Assistance: None risk of falls: Patient Identification Verified: Yes Signs or symptoms of abuse/neglect since last visito No Secondary Verification Process Completed: Yes Hospitalized since last visit: No Patient Requires Transmission-Based Precautions: No Implantable device outside of the clinic excluding No Patient Has Alerts: No cellular tissue based products placed in the center since last visit: Has Dressing in Place as Prescribed: Yes Has Footwear/Offloading in Place as Prescribed: Yes Left: Surgical Shoe with Pressure Relief Insole Pain Present Now: Yes Electronic Signature(s) Signed: 02/28/2022 2:04:01 PM By: [name] RCP, RRT, CHT Entered By: [name] on 02/28/2022 12:53:37 [name] ([id number]) -------------------------------------------------------------------------------- Encounter Discharge Information Details Patient Name: [name]. Date of Service: 02/28/2022 8:00 AM Medical Record Number: [id number] Patient Account Number: [id number] Date of Birth/Sex: 1961-01-25 (61 y.o. M) Treating RN: Cornell Barman Primary Care Shayleigh Bouldin: [name] Other Clinician: [name] Referring  Karalina Tift: [name] Treating Jaelani Posa/Extender: Skipper Cliche in Treatment: 4 Encounter Discharge Information Items Discharge Condition: Stable Ambulatory Status: Knee Scooter Discharge Destination: Home Transportation: Private Auto Accompanied By: self Schedule Follow-up Appointment: Yes Clinical Summary of Care: Notes Patient has an HBO treatment scheduled on 9/1/123 at 08:00 am. Electronic Signature(s) Signed: 02/28/2022 2:04:01 PM By: [name] RCP, RRT, CHT Entered By: [name] on 02/28/2022 12:55:41 [name] ([id number]) -------------------------------------------------------------------------------- Vitals Details Patient Name: [name]. Date of Service: 02/28/2022 8:00 AM Medical Record Number: [id number] Patient Account Number: [id number] Date of Birth/Sex: 1960/07/30 (61 y.o. M) Treating RN: Cornell Barman Primary Care Brenda Samano: [name] Other Clinician: [name] Referring Jazlin Tapscott: [name] Treating Collin Hendley/Extender: Skipper Cliche in Treatment: 4 Vital Signs Time Taken: 08:08 Temperature (F): 97.8 Height (in): 70 Pulse (bpm): 84 Weight (lbs): 255 Respiratory Rate (breaths/min): 16 Body Mass Index (BMI): 36.6 Blood Pressure (mmHg): 122/60 Capillary Blood Glucose (mg/dl): 207 Reference Range: 80 - 120 mg / dl Electronic Signature(s) Signed: 02/28/2022 2:04:01 PM By: [name] RCP, RRT, CHT Entered By: [name] on 02/28/2022 08:52:27

## 2022-03-01 ENCOUNTER — Encounter: Payer: 59 | Attending: Physician Assistant | Admitting: Physician Assistant

## 2022-03-01 DIAGNOSIS — M86372 Chronic multifocal osteomyelitis, left ankle and foot: Secondary | ICD-10-CM | POA: Insufficient documentation

## 2022-03-01 LAB — GLUCOSE, CAPILLARY
Glucose-Capillary: 120 mg/dL — ABNORMAL HIGH (ref 70–99)
Glucose-Capillary: 302 mg/dL — ABNORMAL HIGH (ref 70–99)

## 2022-03-01 NOTE — Progress Notes (Signed)
SHEDDRICK, LATTANZIO ([id number]) Visit Report for 02/28/2022 HBO Details Patient Name: [name], [name]. Date of Service: 02/28/2022 8:00 AM Medical Record Number: [id number] Patient Account Number: [id number] Date of Birth/Sex: [birth year] (61 y.o. M) Treating RN: Cornell Barman Primary Care Shandy Checo: [name] Other Clinician: [name] Referring Estaban Mainville: [name] Treating Markisha Meding/Extender: Skipper Cliche in Treatment: 4 HBO Treatment Course Details Treatment Course Number: 1 Ordering Kerrie Latour: [name] Total Treatments Ordered: 40 HBO Treatment Start Date: 02/18/2022 HBO Indication: Chronic Refractory Osteomyelitis to Left Ankle and Foot HBO Treatment Details Treatment Number: 8 Patient Type: Outpatient Chamber Type: Monoplace Chamber Serial #: [id number] Treatment Protocol: 2.0 ATA with 90 minutes oxygen, and no air breaks Treatment Details Compression Rate Down: 1.5 psi / minute De-Compression Rate Up: 1.5 psi / minute Compress Tx Pressure Air breaks and breathing periods Decompress Decompress Begins Reached (leave unused spaces blank) Begins Ends Chamber Pressure (ATA) 1 2 - - - - - - 2 1 Clock Time (24 hr) 08:16 08:27 - - - - - - 09:58 10:08 Treatment Length: 112 (minutes) Treatment Segments: 4 Vital Signs Capillary Blood Glucose Reference Range: 80 - 120 mg / dl HBO Diabetic Blood Glucose Intervention Range: <131 mg/dl or >249 mg/dl Time Vitals Blood Respiratory Capillary Blood Glucose Pulse Action Type: Pulse: Temperature: Taken: Pressure: Rate: Glucose (mg/dl): Meter #: Oximetry (%) Taken: Pre 08:08 122/60 84 16 97.8 207 1 none per protocol Post 10:23 122/64 84 16 98.1 241 1 none per protocol Pre-Treatment Ear Evaluation Left Right PE Tubes inserted: Yes PE Tubes inserted: Yes Treatment Response Treatment Toleration: Well Treatment Completion Treatment Completed without Adverse Event Status: Electronic Signature(s) Signed: 02/28/2022 2:04:01  PM By: [name] RCP, RRT, CHT Signed: 02/28/2022 5:40:29 PM By: Worthy Keeler PA-C Entered By: [name] on 02/28/2022 12:54:48 [name] ([id number]) -------------------------------------------------------------------------------- HBO Safety Checklist Details Patient Name: [name]. Date of Service: 02/28/2022 8:00 AM Medical Record Number: [id number] Patient Account Number: [id number] Date of Birth/Sex: 29-Mar-1961 (61 y.o. M) Treating RN: Cornell Barman Primary Care Xavian Hardcastle: [name] Other Clinician: [name] Referring Kairon Shock: [name] Treating Hailley Byers/Extender: Skipper Cliche in Treatment: 4 HBO Safety Checklist Items Safety Checklist Consent Form Signed Patient voided / foley secured and emptied When did you last eato 07:00 am Last dose of injectable or oral agent 02/27/22 pm Ostomy pouch emptied and vented if applicable NA All implantable devices assessed, documented and approved NA Intravenous access site secured and place NA Valuables secured Linens and cotton and cotton/polyester blend (less than 51% polyester) Personal oil-based products / skin lotions / body lotions removed Wigs or hairpieces removed NA Smoking or tobacco materials removed NA Books / newspapers / magazines / loose paper removed NA Cologne, aftershave, perfume and deodorant removed Jewelry removed (may wrap wedding band) Make-up removed NA Hair care products removed Battery operated devices (external) removed NA Heating patches and chemical warmers removed NA Titanium eyewear removed NA Nail polish cured greater than 10 hours NA Casting material cured greater than 10 hours NA Hearing aids removed NA Loose dentures or partials removed NA Prosthetics have been removed NA Patient demonstrates correct use of air break device (if applicable) Patient concerns have been addressed Patient grounding bracelet on and cord  attached to chamber Specifics for Inpatients (complete in addition to above) Medication sheet sent with patient Intravenous medications needed or due during therapy sent with patient Drainage tubes (e.g. nasogastric tube or chest tube secured and vented) Endotracheal or Tracheotomy  tube secured Cuff deflated of air and inflated with saline Airway suctioned Electronic Signature(s) Signed: 02/28/2022 2:04:01 PM By: [name] RCP, RRT, CHT Entered By: [name] on 02/28/2022 08:53:57

## 2022-03-01 NOTE — Progress Notes (Signed)
DAVE, MERGEN ([id number]) Visit Report for 03/01/2022 HBO Details Patient Name: [name], [name]. Date of Service: 03/01/2022 7:30 AM Medical Record Number: [id number] Patient Account Number: [id number] Date of Birth/Sex: 1961/05/25 (61 y.o. M) Treating RN: Cornell Barman Primary Care Kayen Grabel: [name] Other Clinician: [name] Referring Danaiya Steadman: [name] Treating Disney Ruggiero/Extender: Skipper Cliche in Treatment: 4 HBO Treatment Course Details Treatment Course Number: 1 Ordering Uliana Brinker: [name] Total Treatments Ordered: 40 HBO Treatment Start Date: 02/18/2022 HBO Indication: Chronic Refractory Osteomyelitis to Left Ankle and Foot HBO Treatment Details Treatment Number: 9 Patient Type: Outpatient Chamber Type: Monoplace Chamber Serial #: [id number] Treatment Protocol: 2.0 ATA with 90 minutes oxygen, and no air breaks Treatment Details Compression Rate Down: 1.5 psi / minute De-Compression Rate Up: 1.5 psi / minute Compress Tx Pressure Air breaks and breathing periods Decompress Decompress Begins Reached (leave unused spaces blank) Begins Ends Chamber Pressure (ATA) 1 2 - - - - - - 2 1 Clock Time (24 hr) 07:50 08:02 - - - - - - 09:32 09:42 Treatment Length: 112 (minutes) Treatment Segments: 4 Vital Signs Capillary Blood Glucose Reference Range: 80 - 120 mg / dl HBO Diabetic Blood Glucose Intervention Range: <131 mg/dl or >249 mg/dl Time Vitals Blood Respiratory Capillary Blood Glucose Pulse Action Type: Pulse: Temperature: Taken: Pressure: Rate: Glucose (mg/dl): Meter #: Oximetry (%) Taken: Pre 07:44 122/62 72 16 98 120 1 Gave Ensure per protocol Post 09:59 118/64 72 16 97.7 302 1 none per protocol Treatment Response Treatment Toleration: Well Treatment Completion Treatment Completed without Adverse Event Status: Electronic Signature(s) Signed: 03/01/2022 10:53:45 AM By: [name] RCP, RRT, CHT Signed: 03/01/2022 1:57:25 PM By: Worthy Keeler PA-C Entered By: Stark Jock, Amado Nash on 03/01/2022 10:48:56 [name] ([id number]) -------------------------------------------------------------------------------- HBO Safety Checklist Details Patient Name: [name]. Date of Service: 03/01/2022 7:30 AM Medical Record Number: [id number] Patient Account Number: [id number] Date of Birth/Sex: Jun 28, 1961 (61 y.o. M) Treating RN: Cornell Barman Primary Care Sam Overbeck: [name] Other Clinician: [name] Referring Joeli Fenner: [name] Treating Jeret Goyer/Extender: Skipper Cliche in Treatment: 4 HBO Safety Checklist Items Safety Checklist Consent Form Signed Patient voided / foley secured and emptied When did you last eato 07:00 am Last dose of injectable or oral agent 07:00 am Ostomy pouch emptied and vented if applicable NA All implantable devices assessed, documented and approved NA Intravenous access site secured and place NA Valuables secured Linens and cotton and cotton/polyester blend (less than 51% polyester) Personal oil-based products / skin lotions / body lotions removed Wigs or hairpieces removed NA Smoking or tobacco materials removed NA Books / newspapers / magazines / loose paper removed NA Cologne, aftershave, perfume and deodorant removed Jewelry removed (may wrap wedding band) Make-up removed NA Hair care products removed Battery operated devices (external) removed NA Heating patches and chemical warmers removed NA Titanium eyewear removed NA Nail polish cured greater than 10 hours NA Casting material cured greater than 10 hours NA Hearing aids removed NA Loose dentures or partials removed NA Prosthetics have been removed NA Patient demonstrates correct use of air break device (if applicable) Patient concerns have been addressed Patient grounding bracelet on and cord attached to chamber Specifics for Inpatients (complete in addition to above) Medication sheet sent  with patient Intravenous medications needed or due during therapy sent with patient Drainage tubes (e.g. nasogastric tube or chest tube secured and vented) Endotracheal or Tracheotomy tube secured Cuff deflated of air and inflated with saline Airway suctioned  Electronic Signature(s) Signed: 03/01/2022 10:53:45 AM By: [name] RCP, RRT, CHT Entered By: [name] on 03/01/2022 09:25:00

## 2022-03-01 NOTE — Progress Notes (Signed)
[name], [name] ([id number]) Visit Report for 03/01/2022 Arrival Information Details Patient Name: [name], [name]. Date of Service: 03/01/2022 7:30 AM Medical Record Number: [id number] Patient Account Number: [id number] Date of Birth/Sex: 12/14/1960 (61 y.o. M) Treating RN: Cornell Barman Primary Care Bertran Zeimet: [name] Other Clinician: [name] Referring Vandella Ord: [name] Treating Donshay Lupinski/Extender: Skipper Cliche in Treatment: 4 Visit Information History Since Last Visit Added or deleted any medications: No Patient Arrived: Knee Scooter Any new allergies or adverse reactions: No Arrival Time: 07:35 Had a fall or experienced change in No Accompanied By: self activities of daily living that may affect Transfer Assistance: None risk of falls: Patient Identification Verified: Yes Signs or symptoms of abuse/neglect since last visito No Secondary Verification Process Completed: Yes Hospitalized since last visit: No Patient Requires Transmission-Based Precautions: No Implantable device outside of the clinic excluding No Patient Has Alerts: No cellular tissue based products placed in the center since last visit: Has Dressing in Place as Prescribed: Yes Has Footwear/Offloading in Place as Prescribed: Yes Left: Surgical Shoe with Pressure Relief Insole Pain Present Now: Yes Electronic Signature(s) Signed: 03/01/2022 10:53:45 AM By: [name] RCP, RRT, CHT Entered By: [name] on 03/01/2022 09:23:37 [name] ([id number]) -------------------------------------------------------------------------------- Encounter Discharge Information Details Patient Name: [name], [name]. Date of Service: 03/01/2022 7:30 AM Medical Record Number: [id number] Patient Account Number: [id number] Date of Birth/Sex: June 03, 1961 (61 y.o. M) Treating RN: Cornell Barman Primary Care Zaryiah Barz: [name] Other Clinician: [name] Referring Christin Moline:  [name] Treating Josian Lanese/Extender: Skipper Cliche in Treatment: 4 Encounter Discharge Information Items Discharge Condition: Stable Ambulatory Status: Knee Scooter Discharge Destination: Home Transportation: Private Auto Accompanied By: self Schedule Follow-up Appointment: Yes Clinical Summary of Care: Notes Patient has an HBO treatment scheduled on 03/05/22 at 08:00 am. Electronic Signature(s) Signed: 03/01/2022 10:53:45 AM By: [name] RCP, RRT, CHT Entered By: [name] on 03/01/2022 10:49:47 [name] ([id number]) -------------------------------------------------------------------------------- Vitals Details Patient Name: [name]. Date of Service: 03/01/2022 7:30 AM Medical Record Number: [id number] Patient Account Number: [id number] Date of Birth/Sex: 07-13-1960 (61 y.o. M) Treating RN: Cornell Barman Primary Care Homer Miller: [name] Other Clinician: [name] Referring Juniel Groene: [name] Treating Anecia Nusbaum/Extender: Skipper Cliche in Treatment: 4 Vital Signs Time Taken: 07:44 Temperature (F): 98.0 Height (in): 70 Pulse (bpm): 72 Weight (lbs): 255 Respiratory Rate (breaths/min): 16 Body Mass Index (BMI): 36.6 Blood Pressure (mmHg): 122/62 Capillary Blood Glucose (mg/dl): 120 Reference Range: 80 - 120 mg / dl Electronic Signature(s) Signed: 03/01/2022 10:53:45 AM By: [name] RCP, RRT, CHT Entered By: Stark Jock, Amado Nash on 03/01/2022 09:24:07

## 2022-03-05 ENCOUNTER — Ambulatory Visit: Payer: 59 | Admitting: Physician Assistant

## 2022-03-05 ENCOUNTER — Encounter: Payer: 59 | Admitting: Physician Assistant

## 2022-03-06 ENCOUNTER — Encounter (HOSPITAL_BASED_OUTPATIENT_CLINIC_OR_DEPARTMENT_OTHER): Payer: 59 | Admitting: Internal Medicine

## 2022-03-06 DIAGNOSIS — M86372 Chronic multifocal osteomyelitis, left ankle and foot: Secondary | ICD-10-CM | POA: Diagnosis not present

## 2022-03-06 DIAGNOSIS — E11621 Type 2 diabetes mellitus with foot ulcer: Secondary | ICD-10-CM

## 2022-03-06 DIAGNOSIS — L97524 Non-pressure chronic ulcer of other part of left foot with necrosis of bone: Secondary | ICD-10-CM | POA: Diagnosis not present

## 2022-03-06 LAB — GLUCOSE, CAPILLARY
Glucose-Capillary: 187 mg/dL — ABNORMAL HIGH (ref 70–99)
Glucose-Capillary: 170 mg/dL — ABNORMAL HIGH (ref 70–99)

## 2022-03-06 NOTE — Progress Notes (Signed)
ARMONI, DEPASS ([id number]) Visit Report for 03/06/2022 Arrival Information Details Patient Name: [name], LICH. Date of Service: 03/06/2022 8:00 AM Medical Record Number: [id number] Patient Account Number: [id number] Date of Birth/Sex: 1960-10-31 (61 y.o. M) Treating RN: Cornell Barman Primary Care Valdemar Mcclenahan: [name] Other Clinician: [name] Referring Nickolaos Brallier: [name] Treating Kayann Maj/Extender: Yaakov Guthrie in Treatment: 5 Visit Information History Since Last Visit Added or deleted any medications: No Patient Arrived: Knee Scooter Any new allergies or adverse reactions: No Arrival Time: 08:03 Had a fall or experienced change in No Accompanied By: self activities of daily living that may affect Transfer Assistance: None risk of falls: Patient Identification Verified: Yes Signs or symptoms of abuse/neglect since last visito No Secondary Verification Process Completed: Yes Hospitalized since last visit: No Patient Requires Transmission-Based Precautions: No Implantable device outside of the clinic excluding No Patient Has Alerts: No cellular tissue based products placed in the center since last visit: Has Dressing in Place as Prescribed: Yes Has Footwear/Offloading in Place as Prescribed: Yes Left: Surgical Shoe with Pressure Relief Insole Pain Present Now: Yes Electronic Signature(s) Signed: 03/06/2022 11:10:39 AM By: [name] RCP, RRT, CHT Entered By: [name] on 03/06/2022 09:05:59 [name] ([id number]) -------------------------------------------------------------------------------- Encounter Discharge Information Details Patient Name: [name]. Date of Service: 03/06/2022 8:00 AM Medical Record Number: [id number] Patient Account Number: [id number] Date of Birth/Sex: May 01, 1961 (61 y.o. M) Treating RN: Cornell Barman Primary Care Hadessah Grennan: [name] Other Clinician: [name] Referring  Lowella Kindley: [name] Treating Eulanda Dorion/Extender: Yaakov Guthrie in Treatment: 5 Encounter Discharge Information Items Discharge Condition: Stable Ambulatory Status: Knee Scooter Discharge Destination: Home Transportation: Private Auto Accompanied By: self Schedule Follow-up Appointment: Yes Clinical Summary of Care: Notes Patient has an HBO treatment scheduled on 03/07/22 at 08:00 am. Electronic Signature(s) Signed: 03/06/2022 11:10:39 AM By: [name] RCP, RRT, CHT Entered By: [name] on 03/06/2022 11:10:02 [name] ([id number]) -------------------------------------------------------------------------------- Vitals Details Patient Name: [name]. Date of Service: 03/06/2022 8:00 AM Medical Record Number: [id number] Patient Account Number: [id number] Date of Birth/Sex: June 11, 1961 (61 y.o. M) Treating RN: Cornell Barman Primary Care Cheick Suhr: [name] Other Clinician: [name] Referring Kattaleya Alia: [name] Treating Robertlee Rogacki/Extender: Yaakov Guthrie in Treatment: 5 Vital Signs Time Taken: 08:08 Temperature (F): 98.0 Height (in): 70 Pulse (bpm): 84 Weight (lbs): 255 Respiratory Rate (breaths/min): 16 Body Mass Index (BMI): 36.6 Blood Pressure (mmHg): 118/72 Capillary Blood Glucose (mg/dl): 187 Reference Range: 80 - 120 mg / dl Electronic Signature(s) Signed: 03/06/2022 11:10:39 AM By: [name] RCP, RRT, CHT Entered By: [name] on 03/06/2022 09:06:42

## 2022-03-06 NOTE — Progress Notes (Signed)
CODIE, KROGH ([id number]) Visit Report for 03/06/2022 HBO Details Patient Name: [name], [name]. Date of Service: 03/06/2022 8:00 AM Medical Record Number: [id number] Patient Account Number: [id number] Date of Birth/Sex: 05-12-1961 (61 y.o. 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 Foot HBO Treatment Details Treatment Number: 10 Patient Type: Outpatient Chamber Type: Monoplace Chamber Serial #: [id number] Treatment Protocol: 2.0 ATA with 90 minutes oxygen, and no air breaks Treatment Details Compression Rate Down: 1.5 psi / minute De-Compression Rate Up: 1.5 psi / minute Compress Tx Pressure Air breaks and breathing periods Decompress Decompress Begins Reached (leave unused spaces blank) Begins Ends Chamber Pressure (ATA) 1 2 - - - - - - 2 1 Clock Time (24 hr) 08:29 08:41 - - - - - - 10:11 10:21 Treatment Length: 112 (minutes) Treatment Segments: 4 Vital Signs Capillary Blood Glucose Reference Range: 80 - 120 mg / dl HBO Diabetic Blood Glucose Intervention Range: <131 mg/dl or >249 mg/dl Time Vitals Blood Respiratory Capillary Blood Glucose Pulse Action Type: Pulse: Temperature: Taken: Pressure: Rate: Glucose (mg/dl): Meter #: Oximetry (%) Taken: Pre 08:08 118/72 84 16 98 187 1 none per protocol Post 10:56 128/68 84 16 98 170 1 none per protocol Treatment Response Treatment Toleration: Well Treatment Completion Treatment Completed without Adverse Event Status: HBO Attestation I certify that I supervised this HBO treatment in accordance with Medicare guidelines. A trained emergency response team is  readily Yes available per hospital policies and procedures. Continue HBOT as ordered. Yes Electronic Signature(s) Signed: 03/06/2022 3:27:51 PM By: Kalman Shan DO Previous Signature: 03/06/2022 11:10:39 AM Version By: [name] RCP, RRT, CHT Entered By: Kalman Shan on 03/06/2022 13:56:31 [name] ([id number]) -------------------------------------------------------------------------------- HBO Safety Checklist Details Patient Name: [name]. Date of Service: 03/06/2022 8:00 AM Medical Record Number: [id number] Patient Account Number: [id number] Date of Birth/Sex: 01/03/1961 (61 y.o. M) Treating RN: Cornell Barman Primary Care Jhordan Mckibben: [name] Other Clinician: [name] Referring Krisanne Lich: [name] Treating Kerensa Nicklas/Extender: Yaakov Guthrie in Treatment: 5 HBO Safety Checklist Items Safety Checklist Consent Form Signed Patient voided / foley secured and emptied When did you last eato 07:00 am Last dose of injectable or oral agent 07:00 am Ostomy pouch emptied and vented if applicable NA All implantable devices assessed, documented and approved NA Intravenous access site secured and place NA Valuables secured Linens and cotton and cotton/polyester blend (less than 51% polyester) Personal oil-based products / skin lotions / body lotions removed Wigs or hairpieces removed NA Smoking or tobacco materials removed NA Books / newspapers / magazines / loose paper removed NA Cologne, aftershave, perfume and deodorant removed Jewelry removed (may wrap wedding band) Make-up removed NA Hair care products removed Battery operated devices (external) removed NA Heating patches and chemical warmers removed NA Titanium eyewear removed NA Nail polish cured greater than 10 hours NA Casting material cured greater than 10 hours NA Hearing aids removed NA Loose dentures or partials removed NA Prosthetics have been  removed NA Patient demonstrates correct use of air break device (if applicable) Patient concerns have been addressed Patient grounding bracelet on and cord attached to chamber Specifics for Inpatients (complete in addition to above) Medication sheet sent with patient Intravenous medications needed  or due during therapy sent with patient Drainage tubes (e.g. nasogastric tube or chest tube secured and vented) Endotracheal or Tracheotomy tube secured Cuff deflated of air and inflated with saline Airway suctioned Electronic Signature(s) Signed: 03/06/2022 11:10:39 AM By: [name] RCP, RRT, CHT Entered By: [name] on 03/06/2022 09:08:01

## 2022-03-07 ENCOUNTER — Encounter: Payer: 59 | Admitting: Physician Assistant

## 2022-03-07 DIAGNOSIS — M86372 Chronic multifocal osteomyelitis, left ankle and foot: Secondary | ICD-10-CM | POA: Diagnosis not present

## 2022-03-07 LAB — GLUCOSE, CAPILLARY
Glucose-Capillary: 177 mg/dL — ABNORMAL HIGH (ref 70–99)
Glucose-Capillary: 136 mg/dL — ABNORMAL HIGH (ref 70–99)
Glucose-Capillary: 213 mg/dL — ABNORMAL HIGH (ref 70–99)

## 2022-03-07 NOTE — Progress Notes (Signed)
TANAY, MISURACA ([id number]) Visit Report for 03/07/2022 Arrival Information Details Patient Name: [name], [name]. Date of Service: 03/07/2022 8:00 AM Medical Record Number: [id number] Patient Account Number: [id number] Date of Birth/Sex: 1960/09/09 (61 y.o. M) Treating RN: Cornell Barman Primary Care Evaleigh Mccamy: [name] Other Clinician: [name] Referring Tashaun Obey: [name] Treating Dionis Autry/Extender: Skipper Cliche in Treatment: 5 Visit Information History Since Last Visit Added or deleted any medications: No Patient Arrived: Knee Scooter Any new allergies or adverse reactions: No Arrival Time: 08:05 Had a fall or experienced change in No Accompanied By: self activities of daily living that may affect Transfer Assistance: None risk of falls: Patient Identification Verified: Yes Signs or symptoms of abuse/neglect since last visito No Secondary Verification Process Completed: Yes Hospitalized since last visit: No Patient Requires Transmission-Based Precautions: No Implantable device outside of the clinic excluding No Patient Has Alerts: No cellular tissue based products placed in the center since last visit: Has Dressing in Place as Prescribed: Yes Pain Present Now: No Electronic Signature(s) Signed: 03/07/2022 11:07:12 AM By: [name] RCP, RRT, CHT Entered By: [name] on 03/07/2022 10:07:48 [name] ([id number]) -------------------------------------------------------------------------------- Encounter Discharge Information Details Patient Name: [name], [name]. Date of Service: 03/07/2022 8:00 AM Medical Record Number: [id number] Patient Account Number: [id number] Date of Birth/Sex: 19-Apr-1961 (61 y.o. M) Treating RN: Cornell Barman Primary Care Glennon Kopko: [name] Other Clinician: [name] Referring Tauna Macfarlane: [name] Treating Morrison Masser/Extender: Skipper Cliche in Treatment: 5 Encounter Discharge  Information Items Discharge Condition: Stable Ambulatory Status: Knee Scooter Discharge Destination: Home Transportation: Private Auto Accompanied By: self Schedule Follow-up Appointment: Yes Clinical Summary of Care: Notes Patient has an HBO treatment scheduled on 03/08/22 at 7:30 am. Electronic Signature(s) Signed: 03/07/2022 11:07:12 AM By: [name] RCP, RRT, CHT Entered By: [name] on 03/07/2022 11:06:01 [name] ([id number]) -------------------------------------------------------------------------------- Vitals Details Patient Name: [name]. Date of Service: 03/07/2022 8:00 AM Medical Record Number: [id number] Patient Account Number: [id number] Date of Birth/Sex: [birth year] (61 y.o. M) Treating RN: Cornell Barman Primary Care Charmine Bockrath: [name] Other Clinician: [name] Referring Shreyas Piatkowski: [name] Treating Fritz Cauthon/Extender: Skipper Cliche in Treatment: 5 Vital Signs Time Taken: 08:10 Temperature (F): 97.7 Height (in): 70 Pulse (bpm): 72 Weight (lbs): 255 Respiratory Rate (breaths/min): 16 Body Mass Index (BMI): 36.6 Blood Pressure (mmHg): 102/62 Capillary Blood Glucose (mg/dl): 177 Reference Range: 80 - 120 mg / dl Electronic Signature(s) Signed: 03/07/2022 11:07:12 AM By: [name] RCP, RRT, CHT Entered By: [name] on 03/07/2022 10:08:25

## 2022-03-08 ENCOUNTER — Encounter: Payer: 59 | Admitting: Physician Assistant

## 2022-03-08 DIAGNOSIS — M86372 Chronic multifocal osteomyelitis, left ankle and foot: Secondary | ICD-10-CM | POA: Diagnosis not present

## 2022-03-08 LAB — GLUCOSE, CAPILLARY
Glucose-Capillary: 133 mg/dL — ABNORMAL HIGH (ref 70–99)
Glucose-Capillary: 176 mg/dL — ABNORMAL HIGH (ref 70–99)

## 2022-03-08 NOTE — Progress Notes (Signed)
YASSIN, SCALES ([id number]) Visit Report for 03/08/2022 Arrival Information Details Patient Name: [name]. Date of Service: 03/08/2022 7:30 AM Medical Record Number: [id number] Patient Account Number: [id number] Date of Birth/Sex: Feb 19, 1961 (61 y.o. M) Treating RN: Cornell Barman Primary Care Navika Hoopes: [name] Other Clinician: [name] Referring Shantal Roan: [name] Treating Geo Slone/Extender: Skipper Cliche in Treatment: 5 Visit Information History Since Last Visit Added or deleted any medications: No Patient Arrived: Ambulatory Any new allergies or adverse reactions: No Arrival Time: 07:35 Had a fall or experienced change in No Accompanied By: self activities of daily living that may affect Transfer Assistance: None risk of falls: Patient Identification Verified: Yes Signs or symptoms of abuse/neglect since last visito No Secondary Verification Process Completed: Yes Hospitalized since last visit: No Patient Requires Transmission-Based Precautions: No Implantable device outside of the clinic excluding No Patient Has Alerts: No cellular tissue based products placed in the center since last visit: Pain Present Now: No Electronic Signature(s) Signed: 03/08/2022 12:36:50 PM By: [name] RCP, RRT, CHT Entered By: [name] on 03/08/2022 08:24:56 [name] ([id number]) -------------------------------------------------------------------------------- Encounter Discharge Information Details Patient Name: [name]. Date of Service: 03/08/2022 7:30 AM Medical Record Number: [id number] Patient Account Number: [id number] Date of Birth/Sex: May 24, 1961 (61 y.o. M) Treating RN: Cornell Barman Primary Care Damyia Strider: [name] Other Clinician: [name] Referring Yaphet Smethurst: [name] Treating Felicity Penix/Extender: Skipper Cliche in Treatment: 5 Encounter Discharge Information Items Discharge Condition:  Stable Ambulatory Status: Ambulatory Discharge Destination: Home Transportation: Private Auto Accompanied By: self Schedule Follow-up Appointment: Yes Clinical Summary of Care: Notes Patient has an HBO treatment scheduled on 03/11/22 at 10:00 am. Electronic Signature(s) Signed: 03/08/2022 12:36:50 PM By: [name] RCP, RRT, CHT Entered By: [name] on 03/08/2022 11:36:57 [name] ([id number]) -------------------------------------------------------------------------------- Vitals Details Patient Name: [name]. Date of Service: 03/08/2022 7:30 AM Medical Record Number: [id number] Patient Account Number: [id number] Date of Birth/Sex: April 26, 1961 (61 y.o. M) Treating RN: Cornell Barman Primary Care Ronda Rajkumar: [name] Other Clinician: [name] Referring Analyce [name]: [name] Treating Shamel Germond/Extender: Skipper Cliche in Treatment: 5 Vital Signs Time Taken: 07:41 Temperature (F): 97.9 Height (in): 70 Pulse (bpm): 72 Weight (lbs): 255 Respiratory Rate (breaths/min): 16 Body Mass Index (BMI): 36.6 Blood Pressure (mmHg): 110/68 Capillary Blood Glucose (mg/dl): 133 Reference Range: 80 - 120 mg / dl Electronic Signature(s) Signed: 03/08/2022 12:36:50 PM By: [name] RCP, RRT, CHT Entered By: [name] on 03/08/2022 08:25:43

## 2022-03-08 NOTE — Progress Notes (Signed)
DEACON, GADBOIS ([id number]) Visit Report for 03/07/2022 HBO Details Patient Name: [name]. Date of Service: 03/07/2022 8:00 AM Medical Record Number: [id number] Patient Account Number: [id number] Date of Birth/Sex: 04/01/1961 (61 y.o. M) Treating RN: Cornell Barman Primary Care Quron Ruddy: [name] Other Clinician: [name] Referring Jennifr Gaeta: [name] Treating Zamauri Nez/Extender: Skipper Cliche in Treatment: 5 HBO Treatment Course Details Treatment Course Number: 1 Ordering Milly Goggins: [name] Total Treatments Ordered: 40 HBO Treatment Start Date: 02/18/2022 HBO Indication: Chronic Refractory Osteomyelitis to Left Ankle and Foot HBO Treatment Details Treatment Number: 11 Patient Type: Outpatient Chamber Type: Monoplace Chamber Serial #: [id number] Treatment Protocol: 2.0 ATA with 90 minutes oxygen, and no air breaks Treatment Details Compression Rate Down: 1.5 psi / minute De-Compression Rate Up: 1.5 psi / minute Compress Tx Pressure Air breaks and breathing periods Decompress Decompress Begins Reached (leave unused spaces blank) Begins Ends Chamber Pressure (ATA) 1 2 - - - - - - 2 1 Clock Time (24 hr) 08:25 08:36 - - - - - - 10:07 10:17 Treatment Length: 112 (minutes) Treatment Segments: 4 Vital Signs Capillary Blood Glucose Reference Range: 80 - 120 mg / dl HBO Diabetic Blood Glucose Intervention Range: <131 mg/dl or >249 mg/dl Time Vitals Blood Respiratory Capillary Blood Glucose Pulse Action Type: Pulse: Temperature: Taken: Pressure: Rate: Glucose (mg/dl): Meter #: Oximetry (%) Taken: Pre 08:10 102/62 72 16 97.7 177 1 none per protocol Post 10:34 122/72 84 16 97.8 213 1 none per protocol Treatment Response Treatment Toleration: Well Treatment Completion Treatment Completed without Adverse Event Status: Electronic Signature(s) Signed: 03/07/2022 11:07:12 AM By: [name] RCP, RRT, CHT Signed: 03/07/2022 5:41:38 PM By: Worthy Keeler PA-C Entered By: [name] on 03/07/2022 11:05:05 [name] ([id number]) -------------------------------------------------------------------------------- HBO Safety Checklist Details Patient Name: [name]. Date of Service: 03/07/2022 8:00 AM Medical Record Number: [id number] Patient Account Number: [id number] Date of Birth/Sex: [birth year] (61 y.o. M) Treating RN: Cornell Barman Primary Care Morrisa Aldaba: [name] Other Clinician: [name] Referring Ciera Beckum: [name] Treating Nickey Kloepfer/Extender: Skipper Cliche in Treatment: 5 HBO Safety Checklist Items Safety Checklist Consent Form Signed Patient voided / foley secured and emptied When did you last eato 07:00 am Last dose of injectable or oral agent 07:00 am Ostomy pouch emptied and vented if applicable NA All implantable devices assessed, documented and approved NA Intravenous access site secured and place NA Valuables secured Linens and cotton and cotton/polyester blend (less than 51% polyester) Personal oil-based products / skin lotions / body lotions removed Wigs or hairpieces removed NA Smoking or tobacco materials removed NA Books / newspapers / magazines / loose paper removed NA Cologne, aftershave, perfume and deodorant removed Jewelry removed (may wrap wedding band) Make-up removed NA Hair care products removed Battery operated devices (external) removed NA Heating patches and chemical warmers removed NA Titanium eyewear removed NA Nail polish cured greater than 10 hours NA Casting material cured greater than 10 hours NA Hearing aids removed NA Loose dentures or partials removed NA Prosthetics have been removed NA Patient demonstrates correct use of air break device (if applicable) Patient concerns have been addressed Patient grounding bracelet on and cord attached to chamber Specifics for Inpatients (complete in addition to above) Medication sheet sent  with patient Intravenous medications needed or due during therapy sent with patient Drainage tubes (e.g. nasogastric tube or chest tube secured and vented) Endotracheal or Tracheotomy tube secured Cuff deflated of air and inflated with saline Airway suctioned Electronic  Signature(s) Signed: 03/07/2022 11:07:12 AM By: [name] RCP, RRT, CHT Entered By: [name] on 03/07/2022 10:09:38

## 2022-03-08 NOTE — Progress Notes (Signed)
KIVEN, VANGILDER ([id number]) Visit Report for 03/08/2022 HBO Details Patient Name: [name], [name]. Date of Service: 03/08/2022 7:30 AM Medical Record Number: [id number] Patient Account Number: [id number] Date of Birth/Sex: 1960-07-05 (61 y.o. M) Treating RN: Cornell Barman Primary Care Harli Engelken: [name] Other Clinician: [name] Referring Patra Gherardi: [name] Treating Mckenley Birenbaum/Extender: Skipper Cliche in Treatment: 5 HBO Treatment Course Details Treatment Course Number: 1 Ordering Enedina Pair: [name] Total Treatments Ordered: 40 HBO Treatment Start Date: 02/18/2022 HBO Indication: Chronic Refractory Osteomyelitis to Left Ankle and Foot HBO Treatment Details Treatment Number: 12 Patient Type: Outpatient Chamber Type: Monoplace Chamber Serial #: [id number] Treatment Protocol: 2.0 ATA with 90 minutes oxygen, and no air breaks Treatment Details Compression Rate Down: 1.5 psi / minute De-Compression Rate Up: 1.5 psi / minute Compress Tx Pressure Air breaks and breathing periods Decompress Decompress Begins Reached (leave unused spaces blank) Begins Ends Chamber Pressure (ATA) 1 2 - - - - - - 2 1 Clock Time (24 hr) 07:50 08:02 - - - - - - 09:32 09:43 Treatment Length: 113 (minutes) Treatment Segments: 4 Vital Signs Capillary Blood Glucose Reference Range: 80 - 120 mg / dl HBO Diabetic Blood Glucose Intervention Range: <131 mg/dl or >249 mg/dl Time Vitals Blood Respiratory Capillary Blood Glucose Pulse Action Type: Pulse: Temperature: Taken: Pressure: Rate: Glucose (mg/dl): Meter #: Oximetry (%) Taken: Pre 07:41 110/68 72 16 97.9 133 1 none per protocol Post 10:04 124/72 78 16 98.4 176 1 none per protocol Treatment Response Treatment Toleration: Well Treatment Completion Treatment Completed without Adverse Event Status: Electronic Signature(s) Signed: 03/08/2022 12:29:30 PM By: Worthy Keeler PA-C Signed: 03/08/2022 12:36:50 PM By: [name] RCP,  RRT, CHT Entered By: [name] on 03/08/2022 10:22:55 [name] ([id number]) -------------------------------------------------------------------------------- HBO Safety Checklist Details Patient Name: [name]. Date of Service: 03/08/2022 7:30 AM Medical Record Number: [id number] Patient Account Number: [id number] Date of Birth/Sex: [birth year] (61 y.o. M) Treating RN: Cornell Barman Primary Care Yariel Ferraris: [name] Other Clinician: [name] Referring Hurley Blevins: [name] Treating Lindwood Mogel/Extender: Skipper Cliche in Treatment: 5 HBO Safety Checklist Items Safety Checklist Consent Form Signed Patient voided / foley secured and emptied When did you last eato 07:00 am Last dose of injectable or oral agent 07:00 am Ostomy pouch emptied and vented if applicable NA All implantable devices assessed, documented and approved NA Intravenous access site secured and place NA Valuables secured Linens and cotton and cotton/polyester blend (less than 51% polyester) Personal oil-based products / skin lotions / body lotions removed Wigs or hairpieces removed NA Smoking or tobacco materials removed NA Books / newspapers / magazines / loose paper removed NA Cologne, aftershave, perfume and deodorant removed Jewelry removed (may wrap wedding band) Make-up removed NA Hair care products removed Battery operated devices (external) removed NA Heating patches and chemical warmers removed NA Titanium eyewear removed NA Nail polish cured greater than 10 hours NA Casting material cured greater than 10 hours NA Hearing aids removed NA Loose dentures or partials removed NA Prosthetics have been removed NA Patient demonstrates correct use of air break device (if applicable) Patient concerns have been addressed Patient grounding bracelet on and cord attached to chamber Specifics for Inpatients (complete in addition to above) Medication sheet sent  with patient Intravenous medications needed or due during therapy sent with patient Drainage tubes (e.g. nasogastric tube or chest tube secured and vented) Endotracheal or Tracheotomy tube secured Cuff deflated of air and inflated with saline Airway suctioned Electronic  Signature(s) Signed: 03/08/2022 12:36:50 PM By: [name] RCP, RRT, CHT Entered By: [name] on 03/08/2022 08:26:52

## 2022-03-11 ENCOUNTER — Encounter: Payer: 59 | Admitting: Physician Assistant

## 2022-03-11 DIAGNOSIS — M86372 Chronic multifocal osteomyelitis, left ankle and foot: Secondary | ICD-10-CM | POA: Diagnosis not present

## 2022-03-11 LAB — GLUCOSE, CAPILLARY
Glucose-Capillary: 157 mg/dL — ABNORMAL HIGH (ref 70–99)
Glucose-Capillary: 142 mg/dL — ABNORMAL HIGH (ref 70–99)

## 2022-03-11 NOTE — Progress Notes (Signed)
TAYSHAWN, PURNELL ([id number]) Visit Report for 03/11/2022 Arrival Information Details Patient Name: BINYAMIN, NELIS. Date of Service: 03/11/2022 10:00 AM Medical Record Number: [id number] Patient Account Number: [id number] Date of Birth/Sex: 1960-11-04 (61 y.o. M) Treating RN: Cornell Barman Primary Care Krystel Fletchall: [name] Other Clinician: [name] Referring Rheannon Cerney: [name] Treating Abbye Lao/Extender: Skipper Cliche in Treatment: 5 Visit Information History Since Last Visit Added or deleted any medications: No Patient Arrived: Knee Scooter Any new allergies or adverse reactions: No Arrival Time: 09:50 Had a fall or experienced change in No Accompanied By: self activities of daily living that may affect Transfer Assistance: None risk of falls: Patient Identification Verified: Yes Signs or symptoms of abuse/neglect since last visito No Secondary Verification Process Completed: Yes Hospitalized since last visit: No Patient Requires Transmission-Based Precautions: No Implantable device outside of the clinic excluding No Patient Has Alerts: No cellular tissue based products placed in the center since last visit: Pain Present Now: No Electronic Signature(s) Signed: 03/11/2022 1:52:16 PM By: [name] RCP, RRT, CHT Entered By: [name] on 03/11/2022 11:49:37 [name] ([id number]) -------------------------------------------------------------------------------- Encounter Discharge Information Details Patient Name: [name]. Date of Service: 03/11/2022 10:00 AM Medical Record Number: [id number] Patient Account Number: [id number] Date of Birth/Sex: 12-15-1960 (61 y.o. M) Treating RN: Cornell Barman Primary Care Branson Kranz: [name] Other Clinician: [name] Referring Tarae Wooden: [name] Treating Nuri Branca/Extender: Skipper Cliche in Treatment: 5 Encounter Discharge Information Items Discharge Condition:  Stable Ambulatory Status: Knee Scooter Discharge Destination: Home Transportation: Private Auto Accompanied By: self Schedule Follow-up Appointment: Yes Clinical Summary of Care: Notes Patient has an HBO treatment scheduled on 03/12/22 at 08:00 am. Electronic Signature(s) Signed: 03/11/2022 1:52:16 PM By: [name] RCP, RRT, CHT Entered By: [name] on 03/11/2022 13:49:40 [name] ([id number]) -------------------------------------------------------------------------------- Vitals Details Patient Name: [name]. Date of Service: 03/11/2022 10:00 AM Medical Record Number: [id number] Patient Account Number: [id number] Date of Birth/Sex: [birth year] (61 y.o. M) Treating RN: Cornell Barman Primary Care Tammera Engert: [name] Other Clinician: [name] Referring Shaelynn Dragos: [name] Treating Hamlin Devine/Extender: Skipper Cliche in Treatment: 5 Vital Signs Time Taken: 10:08 Temperature (F): 97.9 Height (in): 70 Pulse (bpm): 72 Weight (lbs): 255 Respiratory Rate (breaths/min): 16 Body Mass Index (BMI): 36.6 Blood Pressure (mmHg): 132/70 Capillary Blood Glucose (mg/dl): 157 Reference Range: 80 - 120 mg / dl Electronic Signature(s) Signed: 03/11/2022 1:52:16 PM By: [name] RCP, RRT, CHT Entered By: [name] on 03/11/2022 11:51:35

## 2022-03-11 NOTE — Progress Notes (Signed)
[name], [name] ([id number]) Visit Report for 03/11/2022 Chief Complaint Document Details Patient Name: [name], [name]. Date of Service: 03/11/2022 2:15 PM Medical Record Number: [id number] Patient Account Number: [id number] Date of Birth/Sex: 02/12/1961 (61 y.o. M) Treating RN: Cornell Barman Primary Care Provider: [name] Other Clinician: Massie Kluver Referring Provider: [name] Treating Provider/Extender: Skipper Cliche in Treatment: 5 Information Obtained from: Patient Chief Complaint Left lateral foot ulcer Electronic Signature(s) Signed: 03/11/2022 3:01:29 PM By: Worthy Keeler PA-C Entered By: Worthy Keeler on 03/11/2022 15:01:29 [name] ([id number]) -------------------------------------------------------------------------------- Debridement Details Patient Name: [name]. Date of Service: 03/11/2022 2:15 PM Medical Record Number: [id number] Patient Account Number: [id number] Date of Birth/Sex: 1960/12/19 (61 y.o. M) Treating RN: Cornell Barman Primary Care Provider: [name] Other Clinician: Massie Kluver Referring Provider: [name] Treating Provider/Extender: Skipper Cliche in Treatment: 5 Debridement Performed for Wound #14 Left,Lateral Foot Assessment: Performed By: Physician Tommie Sams., PA-C Debridement Type: Debridement Severity of Tissue Pre Debridement: Bone involvement without necrosis Level of Consciousness (Pre- Awake and Alert procedure): Pre-procedure Verification/Time Out Yes - 03:07 Taken: Start Time: 03:07 Total Area Debrided (L x W): 6 (cm) x 5 (cm) = 30 (cm) Tissue and other material Viable, Non-Viable, Bone, Callus, Slough, Subcutaneous, Slough debrided: Level: Skin/Subcutaneous Tissue/Muscle/Bone Debridement Description: Excisional Instrument: Curette Bleeding: Moderate Hemostasis Achieved: Silver Nitrate Response to Treatment: Procedure was tolerated well Level of Consciousness  (Post- Awake and Alert procedure): Post Debridement Measurements of Total Wound Length: (cm) 5 Width: (cm) 4.3 Depth: (cm) 1 Volume: (cm) 16.886 Character of Wound/Ulcer Post Debridement: Stable Severity of Tissue Post Debridement: Bone involvement without necrosis Post Procedure Diagnosis Same as Pre-procedure Notes two silver nitrate sticks used Electronic Signature(s) Signed: 03/11/2022 5:08:52 PM By: Gretta Cool, BSN, RN, CWS, Kim RN, BSN Signed: 03/12/2022 5:16:25 PM By: Worthy Keeler PA-C Signed: 03/13/2022 5:04:43 PM By: Massie Kluver Entered By: Massie Kluver on 03/11/2022 15:18:07 [name] ([id number]) -------------------------------------------------------------------------------- HPI Details Patient Name: [name], [name]. Date of Service: 03/11/2022 2:15 PM Medical Record Number: [id number] Patient Account Number: [id number] Date of Birth/Sex: 09-27-1960 (61 y.o. M) Treating RN: Cornell Barman Primary Care Provider: [name] Other Clinician: Massie Kluver Referring Provider: [name] Treating Provider/Extender: Skipper Cliche in Treatment: 5 History of Present Illness HPI Description: Pleasant 61 year old with history of diabetes (Hgb A1c 10.8 in 2014) and peripheral neuropathy. No PVD. L ABI 1.1. Status post right great toe partial amputation years ago. He was at work and on 10/22/2014, was injured by a cart, and suffered an ulceration to his left anterior calf. He says that it subsequently became infected, and he was treated with a course of antibiotics. He was found on initial exam to have an ulceration on the dorsum of his left third toe. He was unaware of this and attributes it to pressure from his steel toed boots. More recently he injured his right anterior calf on a cart. Ambulating normally per his baseline. He has been undergoing regular debridements, applying mupirocin cream, and an Ace wrap for edema control. He returns to clinic for  follow-up and is without complaints. No pain. No fever or chills. No drainage. 10/25/15; this is a 61 year old man who has type II diabetes with diabetic polyneuropathy. He tells me that he fractured his left fifth metatarsal in June 2016 when he presented with swelling. He does not recall a specific injury. His hemoglobin A1c was apparently too high at the time for consideration of surgery and  he was put in some form of offloading. Ultimately he went to surgery in December with an allograft from his calcaneus to this site, plate and screws. He had an x-ray of the foot in March that showed concerns about nonunion. He tells me that in March he had to move and basically moved himself. He was on his foot a lot and then noticed some drainage from an open area. He has been following with his orthopedic surgeon Dr. Doran Durand. He has been applying a felt donut, dry dressing and using his heel healing sandal. 11/01/15; this is a patient I saw last week for the first time. He had a small open wound on the plantar aspect of his left foot at roughly the level of the base of his fifth metatarsal. He had a considerable degree of thickened skin around this wound on the plantar aspect which I thought was from chronic pressure on this area. He tells [place] that he had drainage over the course of the week. No systemic symptoms. 11/08/15; culture last week grew Citrobacter korseri. This should've been sensitive to the Augmentin I gave him. He has seen Dr. Doran Durand who did his initial surgery and according the patient the plan is to give this another month and then the hardware might need to come out of this. This seems like a reasonable plan. I will adjust his antibiotics to ciprofloxacin which probably should continue for at least another 2 weeks. I gave him 10 days worth today 11/22/15 the patient has completed antibiotics. He has an appointment with Dr. Doran Durand this Friday. There is improved dimensions around the wound on the left  fifth metatarsal base 11/29/15; the patient has completed antibiotics last week. Apparently his appointment with Dr. Doran Durand it is not until this Friday. Dimensions are roughly the same. 12/06/15; saw Dr. Doran Durand. No x-ray told the end of the month, next appointment June 30. We have been using Aquacel Ag 12/13/15: No major change this week. Using Aquacel AG 621/17; arrived this week with maceration around the wound. There was quite a bit of undermining which required surgical debridement. I changed him to [facility] last week, by the patient's admission he was up on this more this week 12/27/15; macerated tissue around the wound is removed with a scalpel and pickups. There is no undermining. Nonviable subcutaneous tissue and skin taken from the superior circumference of the wound is slough from the surface. READMISSION 03/06/16 since I last saw this patient at the end of June, he went for surgery on 01/11/16 by Dr. Doran Durand of orthopedics. He had a left foot irrigation and debridement, removal of hardware and placement of wound VAC. He is also been followed by Dr. Megan Salon of infectious disease and completed a six-week course of IV Rocephin for group B strep and Enterobacter in the bone at the time of surgery. Apparently at the time of surgery the bone looked healthy so I don't think any bone was actually removed. He has been using silver alginate based dressings on the same wound area at the base of the left fifth metatarsal on the left. I note that he is also had arterial studies on 01/08/16, these showed a left ABI of 1.2 to and a right ABI of 1.3. Waveforms were listed as biphasic. He was not felt to have any specific arterial issues. 03/13/16; no real change in the condition of the wound at the left lateral foot at roughly the base of his fifth metatarsal. Use silver alginate last week. 03/18/16  arrives today with no open area. Being suspicious of the overlying callus I. Some of this back although I see  nothing but covering tissue here/epithelium. There is no surrounding tenderness READMISSION 04/16/16 this is a patient I discharged about a month ago. Initially a surgical wound on the lateral aspect of his left foot which subsequently became infected. The story he is giving today that he went back into his own modified shoe started to notice pain 2 weeks ago he was seen in Dr. Nona Dell office by a physician assistant last Wednesday and according the patient was told that everything looked fine however this is clearly now broken open and he has an open wound in the same spot that we have been dealing with repetitively. Situation is complicated by the fact that he is running short of money on long-term disability. He has not taken his insulin and at least 2 weeks was previously on NovoLog short acting insulin on a sliding scale and TRESIBA 35 units at bedtime. He is no longer able to afford any of his medication he was in the x-ray on 10/15. A plain x-ray showed healed fracture of the left fifth metatarsal bone status post removal of the associated plate and screw fixation hardware. There was no acute appearing osseous abnormality. His blood work showed a white count of 9.2 with an essentially normal differential comprehensive metabolic panel was normal. Previous CT scan of the foot on 01/08/16 showed no osteomyelitis previous vascular workup showed no evidence of significant PAD on 01/13/16 04/23/16; culture I did last week grew enterococcus [ampicillin sensitive] and MRSA. He saw infectious disease yesterday. They stop the clindamycin and ordered an MRI. This is not unreasonable. All the hardware is out of the foot at this point. 04/30/16 at this point in time we are still awaiting the results of the MRI at this point in time. Patient did have an area which appears to be somewhat macerated in the proximal portion of the wound where there is overlying necrotic skin that is doing nothing more than  trapping fluid YONIEL, ARKWRIGHT. ([id number]) underneath. He continues to state that he does have some discomfort and again the concern is for the possibility of osteomyelitis hence the reason for the MRI order. We have been using a silver alginate dressing but again I think the reason this with his macerated as it was is that the dressing obscene could not reach the entirety of the wound due to some necrotic skin covering the proximal portion. No bleeding noted at this point in time on initial evaluation. 05/07/16 we receive the results of patient's MRI which shows that he has no evidence of osteomyelitis. This obviously is excellent news. Patient was definitely happy to hear this. He tells me the wound appears to be doing very well at this point in time and is pleased with the progress currently. 05/15/16; as noted the patient did not have osteomyelitis. He has been released by infectious disease and orthopedics. His wound is still open he had a debridement last week but complain when he got home he "bleeding for half a day". He is not had any pain. We have been using silver alginate with Kerlix gauze wrap. 05/28/16; patient arrives today with the wound in much the same condition as last time. He has a small opening on the lateral aspect of his foot however with debridement there is clear undermining medially there is no real evidence of infection here and I didn't see any point in culturing  this. One would have to wonder if this isn't a simple matter of in adequate offloading as he is been using a healing sandal. 06/05/16; the open area is now on the plantar aspect of his foot and not decide. The wound almost appears to have "migrated". This was the term use by our intake nurse. 06/12/16; open area on the plantar aspect of his foot. Base of the wound looks very healthy. This will be his second week in a total contact cast 06/19/16; patient arrived today in a total contact cast. There was some  expectation from our staff and myself that this area would be healed. Unfortunately the area was boggy and with rec pressure a fairly substantial amount of purulent drainage was obtained. Specimen obtained for culture. The patient had no complaints of systemic problems including fever or chills or instability of his diabetes. There was no pain in the foot. Nevertheless a extensive debridement was required. 06/26/16; patient's culture from the abscess last week grew a combination of MRSA and ampicillin sensitive enterococcus. I had him on Augmentin and Septra however I have elected to give him a full 10 day course of Zyvox instead as I Recently treated this combination of organisms with Augmentin and Septra before. Arrives today with no systemic symptoms 07/03/16; the patient has 2 more treatments of Zyvox and then he is finished antibiotics the wound has improved now mostly on the lateral aspect of his foot. There is still some tenderness when he walks. 07/10/16; patient arrives today with Zyvox completed. He only has a small open area remaining. 07/24/16; she arrives here with no overt open area. The covering is thick callus/eschar. Nevertheless there is no open area here. He has some tenderness underneath the area but no overt infection is observed no drainage. The patient has a deformity in the foot with this area weekly to be exposed to more pressure in his foot where nevertheless that something we are going to have to deal with going forward. The patient has diabetic workboots and diabetic shoes. He has had a long difficult course with the area here. This started as a fracture at work. He had bone grafting from his calcaneus and screws. This got infected he had to have more surgery on the area. Bone at the time of that surgery I think showed enterococcus and group B strep. He had 6 weeks of Rocephin. Since then the area has waxed and waned in its difficulty. Recently he had an MRI in December that  did not show osteomyelitis nevertheless he had an abscess that grew MRSA and enterococcus which I elected to treat with Zyvox. This was in December and this wound is actually "healed" over 08/21/16; the patient came in today for his one-month follow-up visit. The area on the lateral aspect of his left foot looks much the same as some month ago. There is no evidence of an open wound here. However the patient tells me about a week after he went back to work he developed severe pain and swelling in the plantar aspect of his right foot first and fifth metatarsal heads. 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 He went to see his podiatrist at [facility]. She told him that he could not work on his feet. She told him to go back and his cam walking boot on the left. Not have open wounds obviously on the right. The patient is  actually gone ahead and retired from his job because he does not feel he can work on his feet 09/18/16; patient comes back in saying he recently had some pain on the lateral aspect of his left foot. Asked that we look at this. Other than that all of his wounds have a viable surface. He has diabetic shoes. He is retired from work. READMISSION [name] is a 61 year old man who we cared for for a prolonged period of time in late 2017 into March 2018. At that point he had suffered a fracture of his left fifth metatarsal at about the level of the base of the fifth metatarsal. He had surgical repair by Dr. Meryl Dare and he developed a very refractory wound over the fifth metatarsal. Ultimately this became infected he required hardware removal this eventually closed over and we discharged from the clinic in March of this year. He tells me as well until roughly 3 weeks ago when he developed increasing pain in the same area making it difficult for him to sleep. He was admitted to hospital from 9/5 through 9/7  with now an ulcer in the same area on the lateral aspect of his left foot at the level of the base of the metatarsal. X-rays and MRIs during this hospitalization were negative. Wound culture showed a combination of Escherichia coli, Morganella and Pseudomonas. He was given IV Cipro and vancomycin the hospital and then discharged home on Cipro and Doxy. He returned to the ER on 9/16 with leg swelling and discomfort. I don't think anything was added at that point although he did have an ultrasound of the left leg that was negative for DVT. He was back in the ER on 10/4 again with pain in the area he was given Bactrim but states he had a significant allergic reaction to that which has abated once he stopped the Bactrim. I don't think he had a full course. He went to see his podiatrist yesterday who recommended some form of foot soak. He has a Darco forefoot offloading boot The patient is a type II diabetic on insulin. Tells me his recent hemoglobin A1c was 7. Arterial studies done in July 2017 showed an ABI in the right of 1.3 on the left 1.2 to biphasic waveforms bilaterally. He was not felt to have arterial disease. As noted the patient has had 2 MRIs most recently on 10/4 this showed no evidence of an abscess or osteomyelitis and no change since the prior study of 03/05/17 there was nonspecific edema on the dorsum of the foot. ABIs in this clinic today 1.2 which would be unchanged from his study done in July/17 04/15/17; now down to 1 small wound on the left lateral foot. We wrapped him and applied collagen last week and the foot is fairly macerated. 04/22/17; small wound on the left lateral foot however it has some undermining. Using collagen 04/29/17; small wound on the left lateral foot some surrounding callus. Chronic damage in this area as a result of initial fracture nonhealing and secondary infection. 05/06/17; the patient arrives today with the area on the left lateral foot closed. There is  thick subcutaneous tissue with a layer of callus. I took some of the callus off just to ensure adequate closure of the underlying tissue and there is no open wound here. He has considerable deformity of this area of his foot however and unless there is an ability to offload this I think opening is going to be recurrent. He tells me he  has diabetic foot wear although I have not actually seen this Readmission: 09/02/17 on evaluation today patient appears to be doing a little better compared to what he tells me what's going on in regard to his left lateral foot. He was previously seen in the ER this past Saturday it is now Tuesday. On Saturday he actually was treated with antibiotics including Cipro 500 mg two times a day and doxycycline 100 mg two times a day. Subsequently he also did have an x-ray performed of his left foot which revealed increased degenerative changes of the base of the fifth metatarsal since comparison in 2018. There was no radiographic evidence of osteomyelitis however. Patient has previously had an MRI of the left foot which was performed on 04/03/17 in this revealed at that point a soft [name], [name]. ([id number]) tissue ulceration but no evidence of osteomyelitis. Patient has previously undergone testing in regard to his ABI's by Dr. Bridgett Larsson and this showed that he did have ABI was within normal limits which does correspond with ABI's we checked here in the office today. That study was on 01/13/16. With all that being said on evaluation today there does not appear to be any evidence of a true open wound of the left lateral fifth metatarsal region. He does have tenderness noted as well is a little bit of boggy/fluctuance feeling to the area in this region of his foot and there is definitely a very solid and firm callous noted laterally. With that being said I am not able to find any obvious opening at this point there does appear to be a spot where there appears to been a opening very  recently however patient states he has not noted any drainage from this currently. He does however have discomfort with palpation of this area this is in the 3-4/10 range only with very firm palpation this is not too significant at all. Subsequently he does have a small ulcer at the medial nail bed of the right second toe where he inadvertently pulled off a portion of the toenail softly causing this injury. Otherwise there does not appear to be any evidence of infection in regard to this right second toe. 09/23/17 on evaluation today patient actually appears to be doing well he has no open ulcerations noted at this point. With that being said he did have significant callous noted over the left lateral foot which did require callous pairing today he tolerated this without complication. Readmission: 11/07/17 patient presents today for readmission concerning to ulcers that he has. One on the right plantar fifth metatarsal region and the other on the left plantar fifth metatarsal region. He has previously had an x-ray which was performed on 11/05/17 and revealed that the patient had wounds noted of the base of the fifth metatarsal region without evidence of destruction to the bone to suggest osteomyelitis. Obviously this is excellent news. With that being said he does have a significantly large ulcer that extensively plantar surface of the wound and is concerning for the fact that he continues to walk on this due to the patient telling me that he "has to work" I do believe this has likely led to both the opening of the wound as well as the fact that is not really able to heal very well at this point. He has been seen by his podiatrist and apparently according to the note dated 10/23/17 the patient was provided with Santyl and recommended a dry dressing. No wound culture has been obtained at  this point. As stated above he has had returned to work due to financial reasons and is not able to wear his postop shoes  due to the fact that he cannot wear this and work. The wound is stated to have been present for "several weeks" prior to the visit with the podiatrist on 10/23/17. With that being said again at least the good news is there does not appear to be any evidence of osteomyelitis. 11/14/17 on evaluation today patient appears to be doing poorly in regard to his bilateral foot ulcers. Unfortunately he seems to have significant callous with a lot of maceration underlined especially on the right even more so than the left. He has been tolerating the dressing changes without complication. With that being said overall I do think he's gonna have to have a significant debridement today. 11/21/17 on evaluation today patient presents for follow-up concerning his bilateral lateral foot ulcers. He has been tolerating the dressing changes without complication. His wife helps perform these for him. With that being said the patient states that the left lateral foot ulcer actually is continuing to give him trouble as far as discomfort is concerned. With that being said actually appears better. 11/28/17 on evaluation today patient appears to be doing poorly in regard to his left lower extremity ulcer in particular. His right as made some progress on the lateral portion although the plantar portion is actually getting somewhat worse I think this is likely due to the fact that he continues to work although it hurts and he's been having a lot of issues out of this side and as far as pain is concerned. With that being said my biggest concern on evaluation today is that of the patient having issues with increased pain in the left lower extremity ulcer site especially on the plantar aspect. He also appears to have what seems to be an abscess developing in the dorsal/lateral portion of his foot which I think likely is communicating somewhere internally with the ulcer site. Nonetheless he has not had any fevers, chills, nausea, or  vomiting noted at this time. He has however had fairly significant increase in pain and he was Artie having a lot of pain previous. Readmission: 01-29-2022 upon evaluation today patient presents for initial inspection here in our clinic concerning the current issue which has been present since around Oct 29, 2021. The patient tells me that he has at this point been seen by Dr. Vickki Muff who has been managing this up to this point. He has also been seen by Dr. Lazaro Arms I will outline the findings there shortly as well. With that being said currently according to Dr. Alvera Singh note on 6-20- 2023 the patient has a prescription for Zosyn that was given by infectious disease. Again that was back in June however. There was a hospital encounter for incision and drainage and debridement of the foot in order to clearway necrotic tissue. The patient also underwent vascular evaluation with Dr. Lucky Cowboy. Nonetheless on the postop visit on 12-18-2021 the patient was seeming to be doing quite well based on what Dr. Vickki Muff said but nonetheless since that time is really not made much progress and is concerned about losing his foot. He therefore requested a referral to wound care to be seen and evaluated and see if there is anything we can do to help and aid with getting this to heal. Fortunately there does not appear to be any evidence of active infection locally or systemically at this time which is  great news. He tells me that he is currently using Betadine wet-to-dry dressings. He does not note any systemic infections but does have evidence of local infection based on what we are seeing. He has had a recent chest x-ray which showed that he had no evidence of acute cardiopulmonary disease that was performed on 12-12-2021. He also underwent vascular intervention with Dr. 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 Subsequently the patient states that Dr. Lucky Cowboy has mentioned that he would need to go back at some time with Dr. Lucky Cowboy said he would consider a posterior tibial approach for revascularization in the future but at this time because of kidney status and the amount of dye used he did not want to proceed any further at that point. Again that something that would likely be scheduled down the road. Nonetheless right now the patient does have an ABI on the left of 0.90 with a TBI of 0.6-0 but this was decreased compared to previous. His ABI on the right was 1.08. Patient did have as well MRI of the left foot which was performed on 12-12-2021 and this shows that he does have interval resection of the proximal third of the fifth metatarsal and area of osteomyelitis on prior MRI. There is persistent ulceration of plantar lateral aspect at the resection site with a sinus tract extending towards the adjacent cuboid bone findings suspicious for osteomyelitis of the plantar lateral aspect of the cuboid and adjacent base of the fourth metatarsal possibly in the proximal most aspect of the remaining fifth metatarsal shaft as well. The ABI performed on 12-13-2021 showed that the left ABI was 0.83 which had previously been registered at 1.35 and the right ABI was 1.20. I think this is consistent with being able to heal but obviously would do better with vascular intervention not something that is going to be looked into in the future. This was noted above as well. Based on what I am seeing today I do believe that the patient is definitely a candidate for HBO therapy I think this may be in his best interest as I think he is at the point of being in a limb threatening situation. I do believe that he would benefit from hyperbarics along with continued antibiotics along with good wound care measures as well. 02-04-2022 upon evaluation today patient appears to be doing still poorly in regard to his  foot he is having a lot of drainage and he notes odor as well. With that being said we still have not heard anything back from insurance yet as far as approval for HBO therapy. Subsequently the patient does have evidence of osteomyelitis and I think hyperbarics is definitely going to be something that would be beneficial for him. He has been on antibiotics several times previous but unfortunately this just does not seem to be sustained enough we did do a culture it showed Pseudomonas as one of the primary organisms both organisms would be treated with Levaquin which I think is going to be the best way to go. I discussed that with the patient today and I Georgina Peer send that into the pharmacy. 02-11-2022 upon evaluation today patient appears to be doing well currently in regard to his wound. I do feel like the Levaquin is doing better for [name], [name] ([id number]) him  which is great news. Fortunately I do not see any evidence of active infection locally or systemically at this time which is great news. No fevers, chills, nausea, vomiting, or diarrhea. 02-19-2022 upon evaluation today patient appears to be doing excellent in regard to his foot ulcer all things considered he still has bone exposed but the good news is we are at least making some progress here I think he does have a lot of odor he tells me and I think switching over to Dakin's moistened gauze dressing is probably can be the way to go. 02-26-2022 upon evaluation today patient appears to be doing well currently in regard to his wound all things considered. Although the 1 thing I do see is that he is not changing the dressing as frequently as he should be. Fortunately there does not appear to be any signs of active infection he tells me his wife is out of town from Saturday through today so it has not been changed since Saturday before that they have been doing this every other day. I explained that with the Dakin's he needs to do this every day  in order to make sure that it heals appropriately. This will also help with the odor which she has been complaining of. 03-11-2022 upon evaluation today patient appears to be doing better overall visually in regard to his wound. He is tolerating the dressing changes without complication. Fortunately there does not appear to be any evidence of active infection locally or systemically at this time. No fevers, chills, nausea, vomiting, or diarrhea. Electronic Signature(s) Signed: 03/11/2022 3:46:01 PM By: Worthy Keeler PA-C Entered By: Worthy Keeler on 03/11/2022 15:46:01 [name] ([id number]) -------------------------------------------------------------------------------- Physical Exam Details Patient Name: ZUBAYR, BEDNARCZYK. Date of Service: 03/11/2022 2:15 PM Medical Record Number: [id number] Patient Account Number: [id number] Date of Birth/Sex: 04/24/1961 (61 y.o. M) Treating RN: Cornell Barman Primary Care Provider: [name] Other Clinician: Massie Kluver Referring Provider: [name] Treating Provider/Extender: Skipper Cliche in Treatment: 5 Constitutional Well-nourished and well-hydrated in no acute distress. Respiratory normal breathing without difficulty. Psychiatric this patient is able to make decisions and demonstrates good insight into disease process. Alert and Oriented x 3. pleasant and cooperative. Notes Upon inspection patient's wound bed actually showed signs of good granulation and epithelization at this point. Fortunately I see no evidence at all of any infection which is good news systemically though locally we are still treating him obviously for the osteomyelitis. With that being said he did have some debridement that I performed today in the central portion of the wound where this was deepest I actually did note bone which has previously been noted as well although this appeared to be loose I was indeed able to pull out an entire piece of bone all  in 1 which actually I think is good to make a huge difference as this apparently was already granulating in behind it it was working its way out but at the same time actually seems much better at this point. I think that he will actually heal this quite readily going forward. There was no additional bone exposed noted based on my probing following removing this. Electronic Signature(s) Signed: 03/11/2022 3:47:01 PM By: Worthy Keeler PA-C Entered By: Worthy Keeler on 03/11/2022 15:47:01 [name] ([id number]) -------------------------------------------------------------------------------- Physician Orders Details Patient Name: DELOREAN, KNUTZEN. Date of Service: 03/11/2022 2:15 PM Medical Record Number: [id number] Patient Account Number: [id number] Date of Birth/Sex: [birth year] (61 y.o. M) Treating  RN: Cornell Barman Primary Care Provider: [name] Other Clinician: Massie Kluver Referring Provider: [name] Treating Provider/Extender: Skipper Cliche in Treatment: 5 Verbal / Phone Orders: No Diagnosis Coding ICD-10 Coding Code Description [phone] Chronic multifocal osteomyelitis, left ankle and foot E11.621 Type 2 diabetes mellitus with foot ulcer L97.524 Non-pressure chronic ulcer of other part of left foot with necrosis of bone T81.31XA Disruption of external operation (surgical) wound, not elsewhere classified, initial encounter Follow-up Appointments o Return Appointment in 1 week. Bathing/ Shower/ Hygiene o May shower with wound dressing protected with water repellent cover or cast protector. Edema Control - Lymphedema / Segmental Compressive Device / Other o Elevate, Exercise Daily and Avoid Standing for Long Periods of Time. o Elevate legs to the level of the heart and pump ankles as often as possible o Elevate leg(s) parallel to the floor when sitting. Off-Loading o Open toe surgical shoe - left o Other: - knee scotter Additional Orders /  Instructions o Other: - need daily dressing changes Hyperbaric Oxygen Therapy Wound #14 Left,Lateral Foot o Evaluate for HBO Therapy o Indication and location: - Left foot osteomyelitis o If appropriate for treatment, begin HBOT per protocol: o 2.0 ATA for 90 Minutes without Air Breaks o One treatment per day (delivered Monday through Friday unless otherwise specified in Special Instructions below): o Total # of Treatments: - 40 o Antihistamine 30 minutes prior to HBO Treatment, difficulty clearing ears. o Finger stick Blood Glucose Pre- and Post- HBOT Treatment. o Follow Hyperbaric Oxygen Glycemia Protocol Wound Treatment Wound #14 - Foot Wound Laterality: Left, Lateral Cleanser: Byram Ancillary Kit - 15 Day Supply (Generic) 3 x Per Week/30 Days Discharge Instructions: Use supplies as instructed; Kit contains: (15) Saline Bullets; (15) 3x3 Gauze; 15 pr Gloves Cleanser: Soap and Water 3 x Per Week/30 Days Discharge Instructions: Gently cleanse wound with antibacterial soap, rinse and pat dry prior to dressing wounds Cleanser: Wound Cleanser 3 x Per Week/30 Days Discharge Instructions: Wash your hands with soap and water. Remove old dressing, discard into plastic bag and place into trash. Cleanse the wound with Wound Cleanser prior to applying a clean dressing using gauze sponges, not tissues or cotton balls. Do not scrub or use excessive force. Pat dry using gauze sponges, not tissue or cotton balls. Primary Dressing: Hydrofera Blue Ready Transfer Foam, 4x5 (in/in) (DME) (Dispense As Written) 3 x Per Week/30 Days Discharge Instructions: Apply Hydrofera Blue Ready to wound bed as directed Secondary Dressing: ABD Pad 5x9 (in/in) (Generic) 3 x Per Week/30 Days JOSAIAH, [name] ([id number]) Discharge Instructions: Cover with ABD pad Secured With: Medipore Tape - 71M Medipore H Soft Cloth Surgical Tape, 2x2 (in/yd) (Generic) 3 x Per Week/30 Days Secured With: [employer]  Sterile or Non-Sterile 6-ply 4.5x4 (yd/yd) (Generic) 3 x Per Week/30 Days Discharge Instructions: Apply Kerlix as directed [place] pre-HBO capillary blood glucose (ensure 1 physician order is in chart). A. Notify HBO physician and await physician orders. 2 If result is 70 mg/dl or below: B. If the result meets the hospital definition of a critical result, follow hospital policy. A. Give patient an 8 ounce Glucerna Shake, an 8 ounce Ensure, or 8 ounces of a Glucerna/Ensure equivalent dietary supplement*. B. Wait 30 minutes. If result is 71 mg/dl to 130 mg/dl: C. Retest patientos capillary blood glucose (CBG). D. If result greater than or equal to 110 mg/dl, proceed with HBO. If result less than 110 mg/dl, notify HBO physician  and consider holding HBO. If result is 131 mg/dl to 249 mg/dl: A. Proceed with HBO. A. Notify HBO physician and await physician orders. B. It is recommended to hold HBO and do If result is 250 mg/dl or greater: blood/urine ketone testing. C. If the result meets the hospital definition of a critical result, follow hospital policy. POST-HBO GLYCEMIA INTERVENTIONS ACTION INTERVENTION Obtain post HBO capillary blood glucose (ensure 1 physician order is in chart). A. Notify HBO physician and await physician orders. 2 If result is 70 mg/dl or below: B. If the result meets the hospital definition of a critical result, follow hospital policy. A. Give patient an 8 ounce Glucerna Shake, an 8 ounce Ensure, or 8 ounces of a Glucerna/Ensure equivalent dietary supplement*. B. Wait 15 minutes for symptoms of hypoglycemia (i.e. nervousness, anxiety, If result is 71 mg/dl to 100 mg/dl: sweating, chills, clamminess, irritability, confusion, tachycardia or dizziness). C. If patient asymptomatic, discharge patient. If patient symptomatic, repeat capillary blood glucose (CBG)  and notify HBO physician. If result is 101 mg/dl to 249 mg/dl: A. Discharge patient. A. Notify HBO physician and await physician orders. B. It is recommended to do blood/urine If result is 250 mg/dl or greater: ketone testing. C. If the result meets the hospital definition of a critical result, follow hospital policy. *Juice or candies are NOT equivalent products. If patient refuses the Glucerna or Ensure, please consult the hospital dietitian for an appropriate substitute. Electronic Signature(s) Signed: 03/12/2022 5:16:25 PM By: Worthy Keeler PA-C Signed: 03/13/2022 5:04:43 PM By: Massie Kluver Entered By: Massie Kluver on 03/11/2022 15:15:35 [name] ([id number]) -------------------------------------------------------------------------------- Problem List Details Patient Name: [name], [name]. Date of Service: 03/11/2022 2:15 PM Medical Record Number: [id number] Patient Account Number: [id number] Date of Birth/Sex: 1961-04-15 (61 y.o. M) Treating RN: Cornell Barman Primary Care Provider: [name] Other Clinician: Massie Kluver Referring Provider: [name] Treating Provider/Extender: Skipper Cliche in Treatment: 5 Active Problems ICD-10 Encounter Code Description Active Date MDM Diagnosis [phone] Chronic multifocal osteomyelitis, left ankle and foot 01/29/2022 No Yes E11.621 Type 2 diabetes mellitus with foot ulcer 01/29/2022 No Yes L97.524 Non-pressure chronic ulcer of other part of left foot with necrosis of 01/29/2022 No Yes bone T81.31XA Disruption of external operation (surgical) wound, not elsewhere 01/29/2022 No Yes classified, initial encounter Inactive Problems Resolved Problems Electronic Signature(s) Signed: 03/11/2022 3:01:25 PM By: Worthy Keeler PA-C Entered By: Worthy Keeler on 03/11/2022 15:01:25 [name] ([id number]) -------------------------------------------------------------------------------- Progress Note Details Patient  Name: [name]. Date of Service: 03/11/2022 2:15 PM Medical Record Number: [id number] Patient Account Number: [id number] Date of Birth/Sex: [birth year] (61 y.o. M) Treating RN: Cornell Barman Primary Care Provider: [name] Other Clinician: Massie Kluver Referring Provider: [name] Treating Provider/Extender: Skipper Cliche in Treatment: 5 Subjective Chief Complaint Information obtained from Patient Left lateral foot ulcer History of Present Illness (HPI) Pleasant 61 year old with history of diabetes (Hgb A1c 10.8 in 2014) and peripheral neuropathy. No PVD. L ABI 1.1. Status post right great toe partial amputation years ago. He was at work and on 10/22/2014, was injured by a cart, and suffered an ulceration to his left anterior calf. He says that it subsequently became infected, and he was treated with a course of antibiotics. He was found on initial exam to have an ulceration on the dorsum of his left third toe. He was unaware of this and attributes it to pressure from his steel toed boots. More recently he injured his right anterior calf  on a cart. Ambulating normally per his baseline. He has been undergoing regular debridements, applying mupirocin cream, and an Ace wrap for edema control. He returns to clinic for follow-up and is without complaints. No pain. No fever or chills. No drainage. 10/25/15; this is a 61 year old man who has type II diabetes with diabetic polyneuropathy. He tells me that he fractured his left fifth metatarsal in June 2016 when he presented with swelling. He does not recall a specific injury. His hemoglobin A1c was apparently too high at the time for consideration of surgery and he was put in some form of offloading. Ultimately he went to surgery in December with an allograft from his calcaneus to this site, plate and screws. He had an x-ray of the foot in March that showed concerns about nonunion. He tells me that in March he had to move and  basically moved himself. He was on his foot a lot and then noticed some drainage from an open area. He has been following with his orthopedic surgeon Dr. Doran Durand. He has been applying a felt donut, dry dressing and using his heel healing sandal. 11/01/15; this is a patient I saw last week for the first time. He had a small open wound on the plantar aspect of his left foot at roughly the level of the base of his fifth metatarsal. He had a considerable degree of thickened skin around this wound on the plantar aspect which I thought was from chronic pressure on this area. He tells [place] that he had drainage over the course of the week. No systemic symptoms. 11/08/15; culture last week grew Citrobacter korseri. This should've been sensitive to the Augmentin I gave him. He has seen Dr. Doran Durand who did his initial surgery and according the patient the plan is to give this another month and then the hardware might need to come out of this. This seems like a reasonable plan. I will adjust his antibiotics to ciprofloxacin which probably should continue for at least another 2 weeks. I gave him 10 days worth today 11/22/15 the patient has completed antibiotics. He has an appointment with Dr. Doran Durand this Friday. There is improved dimensions around the wound on the left fifth metatarsal base 11/29/15; the patient has completed antibiotics last week. Apparently his appointment with Dr. Doran Durand it is not until this Friday. Dimensions are roughly the same. 12/06/15; saw Dr. Doran Durand. No x-ray told the end of the month, next appointment June 30. We have been using Aquacel Ag 12/13/15: No major change this week. Using Aquacel AG 621/17; arrived this week with maceration around the wound. There was quite a bit of undermining which required surgical debridement. I changed him to [facility] last week, by the patient's admission he was up on this more this week 12/27/15; macerated tissue around the wound is removed with a scalpel and  pickups. There is no undermining. Nonviable subcutaneous tissue and skin taken from the superior circumference of the wound is slough from the surface. READMISSION 03/06/16 since I last saw this patient at the end of June, he went for surgery on 01/11/16 by Dr. Doran Durand of orthopedics. He had a left foot irrigation and debridement, removal of hardware and placement of wound VAC. He is also been followed by Dr. Megan Salon of infectious disease and completed a six-week course of IV Rocephin for group B strep and Enterobacter in the bone at the time of surgery. Apparently at the time of surgery the bone looked healthy so I don't think any  bone was actually removed. He has been using silver alginate based dressings on the same wound area at the base of the left fifth metatarsal on the left. I note that he is also had arterial studies on 01/08/16, these showed a left ABI of 1.2 to and a right ABI of 1.3. Waveforms were listed as biphasic. He was not felt to have any specific arterial issues. 03/13/16; no real change in the condition of the wound at the left lateral foot at roughly the base of his fifth metatarsal. Use silver alginate last week. 03/18/16 arrives today with no open area. Being suspicious of the overlying callus I. Some of this back although I see nothing but covering tissue here/epithelium. There is no surrounding tenderness READMISSION 04/16/16 this is a patient I discharged about a month ago. Initially a surgical wound on the lateral aspect of his left foot which subsequently became infected. The story he is giving today that he went back into his own modified shoe started to notice pain 2 weeks ago he was seen in Dr. Nona Dell office by a physician assistant last Wednesday and according the patient was told that everything looked fine however this is clearly now broken open and he has an open wound in the same spot that we have been dealing with repetitively. Situation is complicated by the fact  that he is running short of money on long-term disability. He has not taken his insulin and at least 2 weeks was previously on NovoLog short acting insulin on a sliding scale and TRESIBA 35 units at bedtime. He is no longer able to afford any of his medication he was in the x-ray on 10/15. A plain x-ray showed healed fracture of the left fifth metatarsal bone status post removal of the associated plate and screw fixation hardware. There was no acute appearing osseous abnormality. His blood work showed a white count of 9.2 with an essentially normal differential comprehensive metabolic panel was normal. Previous CT scan of the foot on 01/08/16 showed no osteomyelitis previous vascular workup showed no evidence of significant PAD on 01/13/16 [name], [name] ([id number]) 04/23/16; culture I did last week grew enterococcus [ampicillin sensitive] and MRSA. He saw infectious disease yesterday. They stop the clindamycin and ordered an MRI. This is not unreasonable. All the hardware is out of the foot at this point. 04/30/16 at this point in time we are still awaiting the results of the MRI at this point in time. Patient did have an area which appears to be somewhat macerated in the proximal portion of the wound where there is overlying necrotic skin that is doing nothing more than trapping fluid underneath. He continues to state that he does have some discomfort and again the concern is for the possibility of osteomyelitis hence the reason for the MRI order. We have been using a silver alginate dressing but again I think the reason this with his macerated as it was is that the dressing obscene could not reach the entirety of the wound due to some necrotic skin covering the proximal portion. No bleeding noted at this point in time on initial evaluation. 05/07/16 we receive the results of patient's MRI which shows that he has no evidence of osteomyelitis. This obviously is excellent news. Patient was definitely  happy to hear this. He tells me the wound appears to be doing very well at this point in time and is pleased with the progress currently. 05/15/16; as noted the patient did not have osteomyelitis. He has  been released by infectious disease and orthopedics. His wound is still open he had a debridement last week but complain when he got home he "bleeding for half a day". He is not had any pain. We have been using silver alginate with Kerlix gauze wrap. 05/28/16; patient arrives today with the wound in much the same condition as last time. He has a small opening on the lateral aspect of his foot however with debridement there is clear undermining medially there is no real evidence of infection here and I didn't see any point in culturing this. One would have to wonder if this isn't a simple matter of in adequate offloading as he is been using a healing sandal. 06/05/16; the open area is now on the plantar aspect of his foot and not decide. The wound almost appears to have "migrated". This was the term use by our intake nurse. 06/12/16; open area on the plantar aspect of his foot. Base of the wound looks very healthy. This will be his second week in a total contact cast 06/19/16; patient arrived today in a total contact cast. There was some expectation from our staff and myself that this area would be healed. Unfortunately the area was boggy and with rec pressure a fairly substantial amount of purulent drainage was obtained. Specimen obtained for culture. The patient had no complaints of systemic problems including fever or chills or instability of his diabetes. There was no pain in the foot. Nevertheless a extensive debridement was required. 06/26/16; patient's culture from the abscess last week grew a combination of MRSA and ampicillin sensitive enterococcus. I had him on Augmentin and Septra however I have elected to give him a full 10 day course of Zyvox instead as I Recently treated this combination  of organisms with Augmentin and Septra before. Arrives today with no systemic symptoms 07/03/16; the patient has 2 more treatments of Zyvox and then he is finished antibiotics the wound has improved now mostly on the lateral aspect of his foot. There is still some tenderness when he walks. 07/10/16; patient arrives today with Zyvox completed. He only has a small open area remaining. 07/24/16; she arrives here with no overt open area. The covering is thick callus/eschar. Nevertheless there is no open area here. He has some tenderness underneath the area but no overt infection is observed no drainage. The patient has a deformity in the foot with this area weekly to be exposed to more pressure in his foot where nevertheless that something we are going to have to deal with going forward. The patient has diabetic workboots and diabetic shoes. He has had a long difficult course with the area here. This started as a fracture at work. He had bone grafting from his calcaneus and screws. This got infected he had to have more surgery on the area. Bone at the time of that surgery I think showed enterococcus and group B strep. He had 6 weeks of Rocephin. Since then the area has waxed and waned in its difficulty. Recently he had an MRI in December that did not show osteomyelitis nevertheless he had an abscess that grew MRSA and enterococcus which I elected to treat with Zyvox. This was in December and this wound is actually "healed" over 08/21/16; the patient came in today for his one-month follow-up visit. The area on the lateral aspect of his left foot looks much the same as some month ago. There is no evidence of an open wound here. However the patient tells me  about a week after he went back to work he developed severe pain and swelling in the plantar aspect of his right foot first and fifth metatarsal heads. 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 He went to see his podiatrist at [facility]. She told him that he could not work on his feet. She told him to go back and his cam walking boot on the left. Not have open wounds obviously on the right. The patient is actually gone ahead and retired from his job because he does not feel he can work on his feet 09/18/16; patient comes back in saying he recently had some pain on the lateral aspect of his left foot. Asked that we look at this. Other than that all of his wounds have a viable surface. He has diabetic shoes. He is retired from work. READMISSION [name] is a 61 year old man who we cared for for a prolonged period of time in late 2017 into March 2018. At that point he had suffered a fracture of his left fifth metatarsal at about the level of the base of the fifth metatarsal. He had surgical repair by Dr. Meryl Dare and he developed a very refractory wound over the fifth metatarsal. Ultimately this became infected he required hardware removal this eventually closed over and we discharged from the clinic in March of this year. He tells me as well until roughly 3 weeks ago when he developed increasing pain in the same area making it difficult for him to sleep. He was admitted to hospital from 9/5 through 9/7 with now an ulcer in the same area on the lateral aspect of his left foot at the level of the base of the metatarsal. X-rays and MRIs during this hospitalization were negative. Wound culture showed a combination of Escherichia coli, Morganella and Pseudomonas. He was given IV Cipro and vancomycin the hospital and then discharged home on Cipro and Doxy. He returned to the ER on 9/16 with leg swelling and discomfort. I don't think anything was added at that point although he did have an ultrasound of the left leg that was negative for DVT. He was back in the ER on 10/4 again with pain in the area he was given Bactrim but states he had a significant allergic reaction to  that which has abated once he stopped the Bactrim. I don't think he had a full course. He went to see his podiatrist yesterday who recommended some form of foot soak. He has a Darco forefoot offloading boot The patient is a type II diabetic on insulin. Tells me his recent hemoglobin A1c was 7. Arterial studies done in July 2017 showed an ABI in the right of 1.3 on the left 1.2 to biphasic waveforms bilaterally. He was not felt to have arterial disease. As noted the patient has had 2 MRIs most recently on 10/4 this showed no evidence of an abscess or osteomyelitis and no change since the prior study of 03/05/17 there was nonspecific edema on the dorsum of the foot. ABIs in this clinic today 1.2 which would be unchanged from his study done in July/17 04/15/17; now down to 1 small wound on the left lateral foot. We wrapped him and applied collagen last week and the foot is fairly macerated. 04/22/17; small wound on the left lateral foot however it has some undermining. Using collagen 04/29/17; small wound on the left lateral foot some  surrounding callus. Chronic damage in this area as a result of initial fracture nonhealing and secondary infection. 05/06/17; the patient arrives today with the area on the left lateral foot closed. There is thick subcutaneous tissue with a layer of callus. I took some of the callus off just to ensure adequate closure of the underlying tissue and there is no open wound here. He has considerable deformity of this area of his foot however and unless there is an ability to offload this I think opening is going to be recurrent. He tells me he has diabetic foot wear although I have not actually seen this Readmission: [name], [name] ([id number]) 09/02/17 on evaluation today patient appears to be doing a little better compared to what he tells me what's going on in regard to his left lateral foot. He was previously seen in the ER this past Saturday it is now Tuesday. On Saturday he  actually was treated with antibiotics including Cipro 500 mg two times a day and doxycycline 100 mg two times a day. Subsequently he also did have an x-ray performed of his left foot which revealed increased degenerative changes of the base of the fifth metatarsal since comparison in 2018. There was no radiographic evidence of osteomyelitis however. Patient has previously had an MRI of the left foot which was performed on 04/03/17 in this revealed at that point a soft tissue ulceration but no evidence of osteomyelitis. Patient has previously undergone testing in regard to his ABI's by Dr. Bridgett Larsson and this showed that he did have ABI was within normal limits which does correspond with ABI's we checked here in the office today. That study was on 01/13/16. With all that being said on evaluation today there does not appear to be any evidence of a true open wound of the left lateral fifth metatarsal region. He does have tenderness noted as well is a little bit of boggy/fluctuance feeling to the area in this region of his foot and there is definitely a very solid and firm callous noted laterally. With that being said I am not able to find any obvious opening at this point there does appear to be a spot where there appears to been a opening very recently however patient states he has not noted any drainage from this currently. He does however have discomfort with palpation of this area this is in the 3-4/10 range only with very firm palpation this is not too significant at all. Subsequently he does have a small ulcer at the medial nail bed of the right second toe where he inadvertently pulled off a portion of the toenail softly causing this injury. Otherwise there does not appear to be any evidence of infection in regard to this right second toe. 09/23/17 on evaluation today patient actually appears to be doing well he has no open ulcerations noted at this point. With that being said he did have significant callous  noted over the left lateral foot which did require callous pairing today he tolerated this without complication. Readmission: 11/07/17 patient presents today for readmission concerning to ulcers that he has. One on the right plantar fifth metatarsal region and the other on the left plantar fifth metatarsal region. He has previously had an x-ray which was performed on 11/05/17 and revealed that the patient had wounds noted of the base of the fifth metatarsal region without evidence of destruction to the bone to suggest osteomyelitis. Obviously this is excellent news. With that being said he does have a significantly  large ulcer that extensively plantar surface of the wound and is concerning for the fact that he continues to walk on this due to the patient telling me that he "has to work" I do believe this has likely led to both the opening of the wound as well as the fact that is not really able to heal very well at this point. He has been seen by his podiatrist and apparently according to the note dated 10/23/17 the patient was provided with Santyl and recommended a dry dressing. No wound culture has been obtained at this point. As stated above he has had returned to work due to financial reasons and is not able to wear his postop shoes due to the fact that he cannot wear this and work. The wound is stated to have been present for "several weeks" prior to the visit with the podiatrist on 10/23/17. With that being said again at least the good news is there does not appear to be any evidence of osteomyelitis. 11/14/17 on evaluation today patient appears to be doing poorly in regard to his bilateral foot ulcers. Unfortunately he seems to have significant callous with a lot of maceration underlined especially on the right even more so than the left. He has been tolerating the dressing changes without complication. With that being said overall I do think he's gonna have to have a significant debridement  today. 11/21/17 on evaluation today patient presents for follow-up concerning his bilateral lateral foot ulcers. He has been tolerating the dressing changes without complication. His wife helps perform these for him. With that being said the patient states that the left lateral foot ulcer actually is continuing to give him trouble as far as discomfort is concerned. With that being said actually appears better. 11/28/17 on evaluation today patient appears to be doing poorly in regard to his left lower extremity ulcer in particular. His right as made some progress on the lateral portion although the plantar portion is actually getting somewhat worse I think this is likely due to the fact that he continues to work although it hurts and he's been having a lot of issues out of this side and as far as pain is concerned. With that being said my biggest concern on evaluation today is that of the patient having issues with increased pain in the left lower extremity ulcer site especially on the plantar aspect. He also appears to have what seems to be an abscess developing in the dorsal/lateral portion of his foot which I think likely is communicating somewhere internally with the ulcer site. Nonetheless he has not had any fevers, chills, nausea, or vomiting noted at this time. He has however had fairly significant increase in pain and he was Artie having a lot of pain previous. Readmission: 01-29-2022 upon evaluation today patient presents for initial inspection here in our clinic concerning the current issue which has been present since around Oct 29, 2021. The patient tells me that he has at this point been seen by Dr. Vickki Muff who has been managing this up to this point. He has also been seen by Dr. Lazaro Arms I will outline the findings there shortly as well. With that being said currently according to Dr. Alvera Singh note on 6-20- 2023 the patient has a prescription for Zosyn that was given by infectious disease. Again that  was back in June however. There was a hospital encounter for incision and drainage and debridement of the foot in order to clearway necrotic tissue. The patient also  underwent vascular evaluation with Dr. Lucky Cowboy. Nonetheless on the postop visit on 12-18-2021 the patient was seeming to be doing quite well based on what Dr. Vickki Muff said but nonetheless since that time is really not made much progress and is concerned about losing his foot. He therefore requested a referral to wound care to be seen and evaluated and see if there is anything we can do to help and aid with getting this to heal. Fortunately there does not appear to be any evidence of active infection locally or systemically at this time which is great news. He tells me that he is currently using Betadine wet-to-dry dressings. He does not note any systemic infections but does have evidence of local infection based on what we are seeing. He has had a recent chest x-ray which showed that he had no evidence of acute cardiopulmonary disease that was performed on 12-12-2021. He also underwent vascular intervention with Dr. 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 Subsequently the patient states that Dr. Lucky Cowboy has mentioned that he would need to go back at some time with Dr. Lucky Cowboy said he would consider a posterior tibial approach for revascularization in the future but at this time because of kidney status and the amount of dye used he did not want to proceed any further at that point. Again that something that would likely be scheduled down the road. Nonetheless right now the patient does have an ABI on the left of 0.90 with a TBI of 0.6-0 but this was decreased compared to previous. His ABI on the right was 1.08. Patient did have as well MRI of the left foot which was performed on 12-12-2021 and this  shows that he does have interval resection of the proximal third of the fifth metatarsal and area of osteomyelitis on prior MRI. There is persistent ulceration of plantar lateral aspect at the resection site with a sinus tract extending towards the adjacent cuboid bone findings suspicious for osteomyelitis of the plantar lateral aspect of the cuboid and adjacent base of the fourth metatarsal possibly in the proximal most aspect of the remaining fifth metatarsal shaft as well. The ABI performed on 12-13-2021 showed that the left ABI was 0.83 which had previously been registered at 1.35 and the right ABI was 1.20. I think this is consistent with being able to heal but obviously would do better with vascular intervention not something that is going to be looked into in the future. This was noted above as well. Based on what I am seeing today I do believe that the patient is definitely a candidate for HBO therapy I think this may be in his best interest as I think he is at the point of being in a limb threatening situation. I do believe that he would benefit from hyperbarics along with continued antibiotics along with good wound care measures as well. 02-04-2022 upon evaluation today patient appears to be doing still poorly in regard to his foot he is having a lot of drainage and he notes odor as well. With that being said we still have not heard anything back from insurance yet as far as approval for HBO therapy. Subsequently the patient does have evidence of osteomyelitis and I think hyperbarics is definitely going to be something that would be beneficial for him. He has  been on [name], [name]. ([id number]) antibiotics several times previous but unfortunately this just does not seem to be sustained enough we did do a culture it showed Pseudomonas as one of the primary organisms both organisms would be treated with Levaquin which I think is going to be the best way to go. I discussed that with the patient  today and I Georgina Peer send that into the pharmacy. 02-11-2022 upon evaluation today patient appears to be doing well currently in regard to his wound. I do feel like the Levaquin is doing better for him which is great news. Fortunately I do not see any evidence of active infection locally or systemically at this time which is great news. No fevers, chills, nausea, vomiting, or diarrhea. 02-19-2022 upon evaluation today patient appears to be doing excellent in regard to his foot ulcer all things considered he still has bone exposed but the good news is we are at least making some progress here I think he does have a lot of odor he tells me and I think switching over to Dakin's moistened gauze dressing is probably can be the way to go. 02-26-2022 upon evaluation today patient appears to be doing well currently in regard to his wound all things considered. Although the 1 thing I do see is that he is not changing the dressing as frequently as he should be. Fortunately there does not appear to be any signs of active infection he tells me his wife is out of town from Saturday through today so it has not been changed since Saturday before that they have been doing this every other day. I explained that with the Dakin's he needs to do this every day in order to make sure that it heals appropriately. This will also help with the odor which she has been complaining of. 03-11-2022 upon evaluation today patient appears to be doing better overall visually in regard to his wound. He is tolerating the dressing changes without complication. Fortunately there does not appear to be any evidence of active infection locally or systemically at this time. No fevers, chills, nausea, vomiting, or diarrhea. Objective Constitutional Well-nourished and well-hydrated in no acute distress. Vitals Time Taken: 2:42 PM, Height: 70 in, Weight: 255 lbs, BMI: 36.6, Temperature: 97.9 F, Pulse: 101 bpm, Respiratory Rate: 16  breaths/min, Blood Pressure: 120/70 mmHg. Respiratory normal breathing without difficulty. Psychiatric this patient is able to make decisions and demonstrates good insight into disease process. Alert and Oriented x 3. pleasant and cooperative. General Notes: Upon inspection patient's wound bed actually showed signs of good granulation and epithelization at this point. Fortunately I see no evidence at all of any infection which is good news systemically though locally we are still treating him obviously for the osteomyelitis. With that being said he did have some debridement that I performed today in the central portion of the wound where this was deepest I actually did note bone which has previously been noted as well although this appeared to be loose I was indeed able to pull out an entire piece of bone all in 1 which actually I think is good to make a huge difference as this apparently was already granulating in behind it it was working its way out but at the same time actually seems much better at this point. I think that he will actually heal this quite readily going forward. There was no additional bone exposed noted based on my probing following removing this. Integumentary (Hair, Skin) Wound #14 status is  Open. Original cause of wound was Blister. The date acquired was: 10/29/2021. The wound has been in treatment 5 weeks. The wound is located on the Left,Lateral Foot. The wound measures 5cm length x 4.3cm width x 0.7cm depth; 16.886cm^2 area and 11.82cm^3 volume. There is bone, muscle, and Fat Layer (Subcutaneous Tissue) exposed. There is a medium amount of sanguinous drainage noted. The wound margin is epibole. There is medium (34-66%) red, pink granulation within the wound bed. There is a medium (34-66%) amount of necrotic tissue within the wound bed including Adherent Slough. Assessment Active Problems ICD-10 Chronic multifocal osteomyelitis, left ankle and foot Type 2 diabetes  mellitus with foot ulcer Non-pressure chronic ulcer of other part of left foot with necrosis of bone Disruption of external operation (surgical) wound, not elsewhere classified, initial encounter [name], SCHAFER ([id number]) Procedures Wound #14 Pre-procedure diagnosis of Wound #14 is a Diabetic Wound/Ulcer of the Lower Extremity located on the Left,Lateral Foot .Severity of Tissue Pre Debridement is: Bone involvement without necrosis. There was a Excisional Skin/Subcutaneous Tissue/Muscle/Bone Debridement with a total area of 30 sq cm performed by Tommie Sams., PA-C. With the following instrument(s): Curette to remove Viable and Non-Viable tissue/material. Material removed includes Bone,Callus, Subcutaneous Tissue, and Slough. A time out was conducted at 03:07, prior to the start of the procedure. A Moderate amount of bleeding was controlled with Silver Nitrate. The procedure was tolerated well. Post Debridement Measurements: 5cm length x 4.3cm width x 1cm depth; 16.886cm^3 volume. Character of Wound/Ulcer Post Debridement is stable. Severity of Tissue Post Debridement is: Bone involvement without necrosis. Post procedure Diagnosis Wound #14: Same as Pre-Procedure General Notes: two silver nitrate sticks used. Plan Follow-up Appointments: Return Appointment in 1 week. Bathing/ Shower/ Hygiene: May shower with wound dressing protected with water repellent cover or cast protector. Edema Control - Lymphedema / Segmental Compressive Device / Other: Elevate, Exercise Daily and Avoid Standing for Long Periods of Time. Elevate legs to the level of the heart and pump ankles as often as possible Elevate leg(s) parallel to the floor when sitting. Off-Loading: Open toe surgical shoe - left Other: - knee scotter Additional Orders / Instructions: Other: - need daily dressing changes Hyperbaric Oxygen Therapy: Wound #14 Left,Lateral Foot: Evaluate for HBO Therapy Indication and location: - Left  foot osteomyelitis If appropriate for treatment, begin HBOT per protocol: 2.0 ATA for 90 Minutes without Air Breaks One treatment per day (delivered Monday through Friday unless otherwise specified in Special Instructions below): Total # of Treatments: - 40 Antihistamine 30 minutes prior to HBO Treatment, difficulty clearing ears. Finger stick Blood Glucose Pre- and Post- HBOT Treatment. Follow Hyperbaric Oxygen Glycemia Protocol WOUND #14: - Foot Wound Laterality: Left, Lateral Cleanser: [employer]ram Ancillary Kit - 15 Day Supply (Generic) 3 x Per Week/30 Days Discharge Instructions: Use supplies as instructed; Kit contains: (15) Saline Bullets; (15) 3x3 Gauze; 15 pr Gloves Cleanser: Soap and Water 3 x Per Week/30 Days Discharge Instructions: Gently cleanse wound with antibacterial soap, rinse and pat dry prior to dressing wounds Cleanser: Wound Cleanser 3 x Per Week/30 Days Discharge Instructions: Wash your hands with soap and water. Remove old dressing, discard into plastic bag and place into trash. Cleanse the wound with Wound Cleanser prior to applying a clean dressing using gauze sponges, not tissues or cotton balls. Do not scrub or use excessive force. Pat dry using gauze sponges, not tissue or cotton balls. Primary Dressing: Hydrofera Blue Ready Transfer Foam, 4x5 (in/in) (DME) (Dispense As Written) 3 x  Per Week/30 Days Discharge Instructions: Apply Hydrofera Blue Ready to wound bed as directed Secondary Dressing: ABD Pad 5x9 (in/in) (Generic) 3 x Per Week/30 Days Discharge Instructions: Cover with ABD pad Secured With: Medipore Tape - 86M Medipore H Soft Cloth Surgical Tape, 2x2 (in/yd) (Generic) 3 x Per Week/30 Days Secured With: [employer] Sterile or Non-Sterile 6-ply 4.5x4 (yd/yd) (Generic) 3 x Per Week/30 Days Discharge Instructions: Apply Kerlix as directed 1. I would recommend currently that we actually switch to [facility] which I think is good to be a good option here for the  patient. 2. I am also can recommend that we have the patient continue to monitor for any signs of infection. If he has any issues he should contact the office and let me know. We will see patient back for reevaluation in 1 week here in the clinic. If anything worsens or changes patient will contact our office for additional recommendations. Electronic Signature(s) ATTHEW, COUTANT ([id number]) Signed: 03/11/2022 3:47:26 PM By: Worthy Keeler PA-C Entered By: Worthy Keeler on 03/11/2022 15:47:26 RUDI, BUNYARD ([id number]) -------------------------------------------------------------------------------- SuperBill Details Patient Name: [name]. Date of Service: 03/11/2022 Medical Record Number: [id number] Patient Account Number: [id number] Date of Birth/Sex: 1960-09-29 (61 y.o. 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 T81.31XA Disruption of external operation (surgical) wound, not elsewhere classified, initial encounter Facility Procedures CPT4 Code: 92119417 Description: 11044 - DEB BONE 20 SQ CM/< Modifier: Quantity: 1 CPT4 Code: Description: ICD-10 Diagnosis Description L97.524 Non-pressure chronic ulcer of other part of left foot with necrosis of bo Modifier: ne Quantity: CPT4 Code: [id number] Description: 11047 - DEB BONE EA ADDL 20 SQ CM/< Modifier: Quantity: 1 CPT4 Code: Description: ICD-10 Diagnosis Description L97.524 Non-pressure chronic ulcer of other part of left foot with necrosis of bo Modifier: ne Quantity: Physician Procedures CPT4: Description Modifier Quantity  Code [id number] Debridement; bone (includes epidermis, dermis, subQ tissue, muscle and/or fascia, if performed) 1st 20 1 sqcm or less CPT4: ICD-10 Diagnosis Description L97.524 Non-pressure chronic ulcer of other part of left foot with necrosis of bone CPT4: 8563149 Excisional Debridement, Bone (includes epidermis, dermis, subcutaneous tissue, muscle and/or fascia, if 1 performed); each additional 20 sqcm, or part thereof CPT4: ICD-10 Diagnosis Description L97.524 Non-pressure chronic ulcer of other part of left foot with necrosis of bone Electronic Signature(s) Signed: 03/11/2022 3:47:45 PM By: Worthy Keeler PA-C Entered By: Worthy Keeler on 03/11/2022 15:47:45

## 2022-03-12 ENCOUNTER — Ambulatory Visit: Payer: 59 | Admitting: Physician Assistant

## 2022-03-12 ENCOUNTER — Encounter: Payer: 59 | Admitting: Physician Assistant

## 2022-03-12 DIAGNOSIS — M86372 Chronic multifocal osteomyelitis, left ankle and foot: Secondary | ICD-10-CM | POA: Diagnosis not present

## 2022-03-12 LAB — GLUCOSE, CAPILLARY
Glucose-Capillary: 272 mg/dL — ABNORMAL HIGH (ref 70–99)
Glucose-Capillary: 254 mg/dL — ABNORMAL HIGH (ref 70–99)

## 2022-03-12 NOTE — Progress Notes (Signed)
LEIF, LOFLIN ([id number]) Visit Report for 03/12/2022 HBO Details Patient Name: [name], [name]. Date of Service: 03/12/2022 10:00 AM Medical Record Number: [id number] Patient Account Number: [id number] Date of Birth/Sex: 1960/11/22 (61 y.o. 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 HBO Treatment Details Treatment Number: 14 Patient Type: Outpatient Chamber Type: Monoplace Chamber Serial #: [id number] Treatment Protocol: 2.0 ATA with 90 minutes oxygen, and no air breaks Treatment Details Compression Rate Down: 1.5 psi / minute De-Compression Rate Up: 1.5 psi / minute Compress Tx Pressure Air breaks and breathing periods Decompress Decompress Begins Reached (leave unused spaces blank) Begins Ends Chamber Pressure (ATA) 1 2 - - - - - - 2 1 Clock Time (24 hr) 10:16 10:26 - - - - - - 11:56 12:06 Treatment Length: 110 (minutes) Treatment Segments: 4 Vital Signs Capillary Blood Glucose Reference Range: 80 - 120 mg / dl HBO Diabetic Blood Glucose Intervention Range: <131 mg/dl or >249 mg/dl Time Vitals Blood Respiratory Capillary Blood Glucose Pulse Action Type: Pulse: Temperature: Taken: Pressure: Rate: Glucose (mg/dl): Meter #: Oximetry (%) Taken: Pre 09:59 122/62 72 16 98.1 272 1 none per protocol Post 12:18 122/66 60 16 98.2 254 1 none per protocol Treatment Response Treatment Toleration: Well Treatment Completion Treatment Completed without Adverse Event Status: Electronic Signature(s) Signed: 03/12/2022 1:38:56 PM By: [name], RRT, CHT Signed: 03/12/2022 5:16:25 PM By: [name] Keeler PA-C Entered By: [name], [name] on 03/12/2022 13:37:19 [name] ([id number]) -------------------------------------------------------------------------------- HBO Safety Checklist Details Patient Name: [name]. Date of Service: 03/12/2022 10:00 AM Medical Record Number: [id number] Patient Account Number: [id number] Date of Birth/Sex: 05-Feb-1961 (61 y.o. M) Treating RN: [name] Primary Care [name]: [name] Other Clinician: Jacqulyn [name] Referring [name]: [name] Treating [name]/Extender: [name] in Treatment: 6 HBO Safety Checklist Items Safety Checklist Consent Form Signed Patient voided / foley secured and emptied When did you last eato 07:00 am Last dose of injectable or oral agent 07:00 am Ostomy pouch emptied and vented if applicable NA All implantable devices assessed, documented and approved NA Intravenous access site secured and place NA Valuables secured Linens and cotton and cotton/polyester blend (less than 51% polyester) Personal oil-based products / skin lotions / body lotions removed Wigs or hairpieces removed NA Smoking or tobacco materials removed NA Books / newspapers / magazines / loose paper removed NA Cologne, aftershave, perfume and deodorant removed Jewelry removed (may wrap wedding band) Make-up removed NA Hair care products removed Battery operated devices (external) removed NA Heating patches and chemical warmers removed NA Titanium eyewear removed NA Nail polish cured greater than 10 hours NA Casting material cured greater than 10 hours NA Hearing aids removed NA Loose dentures or partials removed NA Prosthetics have been removed NA Patient demonstrates correct use of air break device (if applicable) Patient concerns have been addressed Patient grounding bracelet on and cord attached to chamber Specifics for Inpatients (complete in addition to above) Medication  sheet sent with patient Intravenous medications needed or due during therapy sent with patient Drainage tubes (e.g. nasogastric tube or chest tube secured and vented) Endotracheal or Tracheotomy tube secured Cuff deflated of air and inflated with saline Airway suctioned Electronic  Signature(s) Signed: 03/12/2022 1:38:56 PM By: [name] RCP, RRT, CHT Entered By: [name] on 03/12/2022 11:17:04

## 2022-03-12 NOTE — Progress Notes (Signed)
[name], [name] ([id number]) Visit Report for 03/12/2022 Arrival Information Details Patient Name: [name], [name]. Date of Service: 03/12/2022 10:00 AM Medical Record Number: [id number] Patient Account Number: [id number] Date of Birth/Sex: 28-Mar-1961 (61 y.o. M) Treating RN: Cornell Barman Primary Care Chenika Nevils: [name] Other Clinician: [name] Referring Jenissa Tyrell: [name] Treating Kain Milosevic/Extender: Skipper Cliche in Treatment: 6 Visit Information History Since Last Visit Added or deleted any medications: No Patient Arrived: Knee Scooter Any new allergies or adverse reactions: No Arrival Time: 09:35 Had a fall or experienced change in No Accompanied By: self activities of daily living that may affect Transfer Assistance: None risk of falls: Patient Identification Verified: Yes Signs or symptoms of abuse/neglect since last visito No Secondary Verification Process Completed: Yes Hospitalized since last visit: No Patient Requires Transmission-Based Precautions: No Implantable device outside of the clinic excluding No Patient Has Alerts: No cellular tissue based products placed in the center since last visit: Has Dressing in Place as Prescribed: Yes Has Footwear/Offloading in Place as Prescribed: Yes Left: Removable Cast Walker/Walking Boot Pain Present Now: No Electronic Signature(s) Signed: 03/12/2022 1:38:56 PM By: [name] RCP, RRT, CHT Entered By: [name] on 03/12/2022 11:15:25 [name] ([id number]) -------------------------------------------------------------------------------- Encounter Discharge Information Details Patient Name: [name]. Date of Service: 03/12/2022 10:00 AM Medical Record Number: [id number] Patient Account Number: [id number] Date of Birth/Sex: 1960-10-27 (62 y.o. M) Treating RN: Cornell Barman Primary Care [place] Oborn: [name] Other Clinician: [name] Referring Azaiah Licciardi:  [name] Treating Reagen Haberman/Extender: Skipper Cliche in Treatment: 6 Encounter Discharge Information Items Discharge Condition: Stable Ambulatory Status: Knee Scooter Discharge Destination: Home Transportation: Private Auto Accompanied By: self Schedule Follow-up Appointment: Yes Clinical Summary of Care: Notes Patient has an HBO treatment scheduled on 03/13/22 at 10:00 am. Electronic Signature(s) Signed: 03/12/2022 1:38:56 PM By: [name] RCP, RRT, CHT Entered By: [name] on 03/12/2022 13:38:32 [name] ([id number]) -------------------------------------------------------------------------------- Vitals Details Patient Name: [name]. Date of Service: 03/12/2022 10:00 AM Medical Record Number: [id number] Patient Account Number: [id number] Date of Birth/Sex: 1961/05/11 (61 y.o. M) Treating RN: Cornell Barman Primary Care Viona Hosking: [name] Other Clinician: [name] Referring Ostin Mathey: [name] Treating Messiah Rovira/Extender: Skipper Cliche in Treatment: 6 Vital Signs Time Taken: 09:59 Temperature (F): 98.1 Height (in): 70 Pulse (bpm): 72 Weight (lbs): 255 Respiratory Rate (breaths/min): 16 Body Mass Index (BMI): 36.6 Blood Pressure (mmHg): 122/62 Capillary Blood Glucose (mg/dl): 272 Reference Range: 80 - 120 mg / dl Electronic Signature(s) Signed: 03/12/2022 1:38:56 PM By: [name] RCP, RRT, CHT Entered By: [name] on 03/12/2022 11:16:15

## 2022-03-12 NOTE — Progress Notes (Signed)
AYAZ, SONDGEROTH ([id number]) Visit Report for 03/11/2022 HBO Details Patient Name: [name], [name]. Date of Service: 03/11/2022 10:00 AM Medical Record Number: [id number] Patient Account Number: [id number] Date of Birth/Sex: [birth year] (61 y.o. 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 HBO Treatment Details Treatment Number: 13 Patient Type: Outpatient Chamber Type: Monoplace Chamber Serial #: [id number] Treatment Protocol: 2.0 ATA with 90 minutes oxygen, and no air breaks Treatment Details Compression Rate Down: 1.5 psi / minute De-Compression Rate Up: 1.5 psi / minute Compress Tx Pressure Air breaks and breathing periods Decompress Decompress Begins Reached (leave unused spaces blank) Begins Ends Chamber Pressure (ATA) 1 2 - - - - - - 2 1 Clock Time (24 hr) 10:39 10:50 - - - - - - 12:20 12:31 Treatment Length: 112 (minutes) Treatment Segments: 4 Vital Signs Capillary Blood Glucose Reference Range: 80 - 120 mg / dl HBO Diabetic Blood Glucose Intervention Range: <131 mg/dl or >249 mg/dl Time Vitals Blood Respiratory Capillary Blood Glucose Pulse Action Type: Pulse: Temperature: Taken: Pressure: Rate: Glucose (mg/dl): Meter #: Oximetry (%) Taken: Pre 10:08 132/70 72 16 97.9 157 1 none per protocol Post 12:38 132/62 78 16 98 142 1 none per protocol Treatment Response Treatment Toleration: Well Treatment Completion Treatment Completed without Adverse Event Status: Electronic Signature(s) Signed: 03/11/2022 1:52:16 PM By: Zackery Barefoot, RRT, CHT Signed: 03/12/2022 5:16:25 PM By: Worthy Keeler PA-C Entered By: Stark Jock, Amado Nash on 03/11/2022 13:48:48 [name] ([id number]) -------------------------------------------------------------------------------- HBO Safety Checklist Details Patient Name: [name]. Date of Service: 03/11/2022 10:00 AM Medical Record Number: [id number] Patient Account Number: [id number] Date of Birth/Sex: [birth year] (61 y.o. M) Treating RN: Cornell Barman Primary Care Krishana Lutze: [name] Other Clinician: [name] Referring Lateefa Crosby: [name] Treating Shanie Mauzy/Extender: Skipper Cliche in Treatment: 5 HBO Safety Checklist Items Safety Checklist Consent Form Signed Patient voided / foley secured and emptied When did you last eato 7:00 am Last dose of injectable or oral agent 7:00 am Ostomy pouch emptied and vented if applicable NA All implantable devices assessed, documented and approved NA Intravenous access site secured and place NA Valuables secured Linens and cotton and cotton/polyester blend (less than 51% polyester) Personal oil-based products / skin lotions / body lotions removed Wigs or hairpieces removed NA Smoking or tobacco materials removed NA Books / newspapers / magazines / loose paper removed NA Cologne, aftershave, perfume and deodorant removed Jewelry removed (may wrap wedding band) Make-up removed NA Hair care products removed Battery operated devices (external) removed NA Heating patches and chemical warmers removed NA Titanium eyewear removed NA Nail polish cured greater than 10 hours NA Casting material cured greater than 10 hours NA Hearing aids removed NA Loose dentures or partials removed NA Prosthetics have been removed NA Patient demonstrates correct use of air break device (if applicable) Patient concerns have been addressed Patient grounding bracelet on and cord attached to chamber Specifics for Inpatients (complete in addition to above) Medication sheet sent  with patient Intravenous medications needed or due during therapy sent with patient Drainage tubes (e.g. nasogastric tube or chest tube secured and vented) Endotracheal or Tracheotomy tube secured Cuff deflated of air and inflated with saline Airway suctioned Electronic  Signature(s) Signed: 03/11/2022 1:52:16 PM By: [name] RCP, RRT, CHT Entered By: [name] on 03/11/2022 11:52:51

## 2022-03-13 ENCOUNTER — Encounter (HOSPITAL_BASED_OUTPATIENT_CLINIC_OR_DEPARTMENT_OTHER): Payer: 59 | Admitting: Internal Medicine

## 2022-03-13 DIAGNOSIS — L97524 Non-pressure chronic ulcer of other part of left foot with necrosis of bone: Secondary | ICD-10-CM

## 2022-03-13 DIAGNOSIS — M86372 Chronic multifocal osteomyelitis, left ankle and foot: Secondary | ICD-10-CM | POA: Diagnosis not present

## 2022-03-13 DIAGNOSIS — E11621 Type 2 diabetes mellitus with foot ulcer: Secondary | ICD-10-CM | POA: Diagnosis not present

## 2022-03-13 LAB — GLUCOSE, CAPILLARY
Glucose-Capillary: 244 mg/dL — ABNORMAL HIGH (ref 70–99)
Glucose-Capillary: 171 mg/dL — ABNORMAL HIGH (ref 70–99)

## 2022-03-13 NOTE — Progress Notes (Signed)
RAWLEIGH, RODE ([id number]) Visit Report for 03/11/2022 Arrival Information Details Patient Name: [name], [name]. Date of Service: 03/11/2022 2:15 PM Medical Record Number: [id number] Patient Account Number: [id number] Date of Birth/Sex: Apr 02, 1961 (61 y.o. M) Treating RN: Cornell Barman Primary Care Dimitra Woodstock: [name] Other Clinician: Massie Kluver Referring Chevella Pearce: [name] Treating Kalab Camps/Extender: Skipper Cliche in Treatment: 5 Visit Information History Since Last Visit All ordered tests and consults were completed: No Patient Arrived: Knee Scooter Added or deleted any medications: No Arrival Time: 14:35 Any new allergies or adverse reactions: No Transfer Assistance: None Had a fall or experienced change in No Patient Requires Transmission-Based Precautions: No activities of daily living that may affect Patient Has Alerts: No risk of falls: Hospitalized since last visit: No Pain Present Now: Yes Electronic Signature(s) Signed: 03/13/2022 5:04:43 PM By: Massie Kluver Entered By: Massie Kluver on 03/11/2022 14:41:44 [name] ([id number]) -------------------------------------------------------------------------------- Clinic Level of Care Assessment Details Patient Name: [name]. Date of Service: 03/11/2022 2:15 PM Medical Record Number: [id number] Patient Account Number: [id number] Date of Birth/Sex: 1960-11-23 (61 y.o. M) Treating RN: Cornell Barman Primary Care Addley Ballinger: [name] Other Clinician: Massie Kluver Referring Syncere Eble: [name] Treating Khalia Gong/Extender: Skipper Cliche in Treatment: 5 Clinic Level of Care Assessment Items TOOL 1 Quantity Score _0  - Use when EandM and Procedure is performed on INITIAL visit 0 ASSESSMENTS - Nursing Assessment / Reassessment _1  - General Physical Exam (combine w/ comprehensive assessment (listed just below) when performed on new 0 pt. evals) _2  - 0 Comprehensive Assessment (HX,  ROS, Risk Assessments, Wounds Hx, etc.) ASSESSMENTS - Wound and Skin Assessment / Reassessment _3  - Dermatologic / Skin Assessment (not related to wound area) 0 ASSESSMENTS - Ostomy and/or Continence Assessment and Care _4  - Incontinence Assessment and Management 0 _5  - 0 Ostomy Care Assessment and Management (repouching, etc.) PROCESS - Coordination of Care _6  - Simple Patient / Family Education for ongoing care 0 _7  - 0 Complex (extensive) Patient / Family Education for ongoing care _8  - 0 Staff obtains Programmer, systems, Records, Test Results / Process Orders _9  - 0 Staff telephones HHA, Nursing Homes / Clarify orders / etc _10  - 0 Routine Transfer to another Facility (non-emergent condition) _11  - 0 Routine Hospital Admission (non-emergent condition) _12  - 0 New Admissions / Biomedical engineer / Ordering NPWT, Apligraf, etc. _13  - 0 Emergency Hospital Admission (emergent condition) PROCESS - Special Needs _14  - Pediatric / Minor Patient Management 0 _15  - 0 Isolation Patient Management _16  - 0 Hearing / Language / Visual special needs _17  - 0 Assessment of Community assistance (transportation, D/C planning, etc.) _18  - 0 Additional assistance / Altered mentation _19  - 0 Support Surface(s) Assessment (bed, cushion, seat, etc.) INTERVENTIONS - Miscellaneous _20  - External ear exam 0 _21  - 0 Patient Transfer (multiple staff / Civil Service fast streamer / Similar devices) _22  - 0 Simple Staple / Suture removal (25 or less) _23  - 0 Complex Staple / Suture removal (26 or more) _24  - 0 Hypo/Hyperglycemic Management (do not check if billed separately) _25  - 0 Ankle / Brachial Index (ABI) - do not check if billed separately Has the patient been seen at the hospital within the last three years: Yes Total Score: 0 Level Of Care: ____ [name] ([id number]) Electronic Signature(s) Signed: 03/13/2022 5:04:43 PM By: Massie Kluver Entered By: Massie Kluver on 03/11/2022 15:15:41 [name]  ([id number]) -------------------------------------------------------------------------------- Encounter Discharge Information Details Patient Name: [name], [name]. Date of Service: 03/11/2022 2:15 PM  Medical Record Number: [id number] Patient Account Number: [id number] Date of Birth/Sex: July 13, 1960 (61 y.o. M) Treating RN: Cornell Barman Primary Care Malikiah Debarr: [name] Other Clinician: Massie Kluver Referring Avian Konigsberg: [name] Treating Esaw Knippel/Extender: Skipper Cliche in Treatment: 5 Encounter Discharge Information Items Post Procedure Vitals Discharge Condition: Stable Temperature (F): 97.9 Ambulatory Status: Knee Scooter Pulse (bpm): 101 Discharge Destination: Home Respiratory Rate (breaths/min): 18 Transportation: Private Auto Blood Pressure (mmHg): 120/70 Accompanied By: self Schedule Follow-up Appointment: No Clinical Summary of Care: Electronic Signature(s) Signed: 03/13/2022 5:04:43 PM By: Massie Kluver Entered By: Massie Kluver on 03/11/2022 17:04:02 [name] ([id number]) -------------------------------------------------------------------------------- Lower Extremity Assessment Details Patient Name: [name]. Date of Service: 03/11/2022 2:15 PM Medical Record Number: [id number] Patient Account Number: [id number] Date of Birth/Sex: 09/11/1960 (61 y.o. M) Treating RN: Cornell Barman Primary Care Maxamillion Banas: [name] Other Clinician: Massie Kluver Referring Chereese Cilento: [name] Treating Sapphire Tygart/Extender: Skipper Cliche in Treatment: 5 Electronic Signature(s) Signed: 03/11/2022 5:08:52 PM By: Gretta Cool BSN, RN, CWS, Kim RN, BSN Signed: 03/13/2022 5:04:43 PM By: Massie Kluver Entered By: Massie Kluver on 03/11/2022 14:56:59 [name] ([id number]) -------------------------------------------------------------------------------- Multi Wound Chart Details Patient Name: [name], [name]. Date of Service: 03/11/2022 2:15 PM Medical  Record Number: [id number] Patient Account Number: [id number] Date of Birth/Sex: Sep 25, 1960 (61 y.o. M) Treating RN: Cornell Barman Primary Care Jeannett Dekoning: [name] Other Clinician: Massie Kluver Referring Elianne Gubser: [name] Treating Deneane Stifter/Extender: Skipper Cliche in Treatment: 5 Vital Signs Height(in): 70 Pulse(bpm): 101 Weight(lbs): 255 Blood Pressure(mmHg): 120/70 Body Mass Index(BMI): 36.6 Temperature(F): 97.9 Respiratory Rate(breaths/min): 16 Photos: [N/A:N/A] Wound Location: Left, Lateral Foot N/A N/A Wounding Event: Blister N/A N/A Primary Etiology: Diabetic Wound/Ulcer of the Lower N/A N/A Extremity Secondary Etiology: Dehisced Wound N/A N/A Comorbid History: Hypertension, Type II Diabetes, N/A N/A Neuropathy Date Acquired: 10/29/2021 N/A N/A Weeks of Treatment: 5 N/A N/A Wound Status: Open N/A N/A Wound Recurrence: No N/A N/A Pending Amputation on Yes N/A N/A Presentation: Measurements L x W x D (cm) 5x4.3x0.7 N/A N/A Area (cm) : 16.886 N/A N/A Volume (cm) : 11.82 N/A N/A % Reduction in Area: -56.40% N/A N/A % Reduction in Volume: 56.20% N/A N/A Classification: Grade 3 N/A N/A Exudate Amount: Medium N/A N/A Exudate Type: Sanguinous N/A N/A Exudate Color: red N/A N/A Wound Margin: Epibole N/A N/A Granulation Amount: Medium (34-66%) N/A N/A Granulation Quality: Red, Pink N/A N/A Necrotic Amount: Medium (34-66%) N/A N/A Exposed Structures: Fat Layer (Subcutaneous Tissue): N/A N/A Yes Muscle: Yes Bone: Yes Fascia: No Tendon: No Joint: No Epithelialization: None N/A N/A Treatment Notes Electronic Signature(s) Signed: 03/13/2022 5:04:43 PM By: Willa Frater ([id number]) Entered By: Massie Kluver on 03/11/2022 14:58:11 [name] ([id number]) -------------------------------------------------------------------------------- [place] Details Patient Name: [name], [name]. Date of Service: 03/11/2022  2:15 PM Medical Record Number: [id number] Patient Account Number: [id number] Date of Birth/Sex: 12/06/1960 (61 y.o. M) Treating RN: Cornell Barman Primary Care Shaundra Fullam: [name] Other Clinician: Massie Kluver Referring Havannah Streat: [name] Treating Roosvelt Churchwell/Extender: Skipper Cliche in Treatment: 5 Active Inactive HBO Nursing Diagnoses: Anxiety related to feelings of confinement associated with the hyperbaric oxygen chamber Anxiety related to knowledge deficit of hyperbaric oxygen therapy and treatment procedures Discomfort related to temperature and humidity changes inside hyperbaric chamber Potential for barotraumas to ears, sinuses, teeth, and lungs or cerebral gas embolism related to changes in atmospheric pressure inside hyperbaric oxygen chamber Potential for oxygen toxicity seizures related to delivery of 100% oxygen at an increased atmospheric pressure  Potential for pulmonary oxygen toxicity related to delivery of 100% oxygen at an increased atmospheric pressure Goals: Barotrauma will be prevented during HBO2 Date Initiated: 02/11/2022 Target Resolution Date: 02/11/2022 Goal Status: Active Patient and/or family will be able to state/discuss factors appropriate to the management of their disease process during treatment Date Initiated: 02/11/2022 Target Resolution Date: 02/11/2022 Goal Status: Active Patient will tolerate the hyperbaric oxygen therapy treatment Date Initiated: 02/11/2022 Target Resolution Date: 02/11/2022 Goal Status: Active Patient will tolerate the internal climate of the chamber Date Initiated: 02/11/2022 Target Resolution Date: 02/12/2022 Goal Status: Active Patient/caregiver will verbalize understanding of HBO goals, rationale, procedures and potential hazards Date Initiated: 02/11/2022 Target Resolution Date: 02/11/2022 Goal Status: Active Signs and symptoms of pulmonary oxygen toxicity will be recognized and promptly addressed Date Initiated:  02/11/2022 Target Resolution Date: 02/11/2022 Goal Status: Active Signs and symptoms of seizure will be recognized and promptly addressed ; seizing patients will suffer no harm Date Initiated: 02/11/2022 Target Resolution Date: 02/11/2022 Goal Status: Active Interventions: Administer a five (5) minute air break for patient if signs and symptoms of seizure appear and notify the hyperbaric physician Administer decongestants, per physician orders, prior to HBO2 Administer the correct therapeutic gas delivery based on the patients needs and limitations, per physician order Assess and provide for patientos comfort related to the hyperbaric environment and equalization of middle ear Assess for signs and symptoms related to adverse events, including but not limited to confinement anxiety, pneumothorax, oxygen toxicity and baurotrauma Assess patient for any history of confinement anxiety Assess patient's knowledge and expectations regarding hyperbaric medicine and provide education related to the hyperbaric environment, goals of treatment and prevention of adverse events Implement protocols to decrease risk of pneumothorax in high risk patients Notes: Necrotic Tissue Nursing Diagnoses: Impaired tissue integrity related to necrotic/devitalized tissue [name], [name] ([id number]) Knowledge deficit related to management of necrotic/devitalized tissue Goals: Necrotic/devitalized tissue will be minimized in the wound bed Date Initiated: 02/11/2022 Target Resolution Date: 02/12/2022 Goal Status: Active Patient/caregiver will verbalize understanding of reason and process for debridement of necrotic tissue Date Initiated: 02/11/2022 Target Resolution Date: 02/11/2022 Goal Status: Active Interventions: Assess patient pain level pre-, during and post procedure and prior to discharge Provide education on necrotic tissue and debridement process Treatment Activities: Apply topical anesthetic as ordered :  02/11/2022 Enzymatic debridement : 02/11/2022 Excisional debridement : 02/11/2022 Notes: Wound/Skin Impairment Nursing Diagnoses: Knowledge deficit related to ulceration/compromised skin integrity Goals: Patient/caregiver will verbalize understanding of skin care regimen Date Initiated: 01/29/2022 Target Resolution Date: 03/01/2022 Goal Status: Active Ulcer/skin breakdown will have a volume reduction of 30% by week 4 Date Initiated: 01/29/2022 Target Resolution Date: 03/01/2022 Goal Status: Active Ulcer/skin breakdown will have a volume reduction of 50% by week 8 Date Initiated: 01/29/2022 Target Resolution Date: 03/31/2022 Goal Status: Active Ulcer/skin breakdown will have a volume reduction of 80% by week 12 Date Initiated: 01/29/2022 Target Resolution Date: 05/01/2022 Goal Status: Active Ulcer/skin breakdown will heal within 14 weeks Date Initiated: 01/29/2022 Target Resolution Date: 05/01/2022 Goal Status: Active Interventions: Assess patient/caregiver ability to obtain necessary supplies Assess patient/caregiver ability to perform ulcer/skin care regimen upon admission and as needed Assess ulceration(s) every visit Notes: Electronic Signature(s) Signed: 03/11/2022 5:08:52 PM By: Gretta Cool, BSN, RN, CWS, Kim RN, BSN Signed: 03/13/2022 5:04:43 PM By: Massie Kluver Entered By: Massie Kluver on 03/11/2022 15:15:19 [name] ([id number]) -------------------------------------------------------------------------------- Pain Assessment Details Patient Name: [name], [name]. Date of Service: 03/11/2022 2:15 PM Medical Record Number: [id number] Patient Account Number:  [id number] Date of Birth/Sex: 03-07-1961 (61 y.o. M) Treating RN: Cornell Barman Primary Care Emannuel Vise: [name] Other Clinician: Massie Kluver Referring Rondel Episcopo: [name] Treating Heavenly Christine/Extender: Skipper Cliche in Treatment: 5 Active Problems Location of Pain Severity and Description of Pain Patient Has Paino  Yes Site Locations Pain Location: Pain in Ulcers Duration of the Pain. Constant / Intermittento Constant Rate the pain. Current Pain Level: 6 Character of Pain Describe the Pain: Burning Pain Management and Medication Current Pain Management: Medication: Yes Rest: Yes Electronic Signature(s) Signed: 03/11/2022 5:08:52 PM By: Gretta Cool, BSN, RN, CWS, Kim RN, BSN Signed: 03/13/2022 5:04:43 PM By: Massie Kluver Entered By: Massie Kluver on 03/11/2022 14:45:49 [name] ([id number]) -------------------------------------------------------------------------------- Patient/Caregiver Education Details Patient Name: [name], [name]. Date of Service: 03/11/2022 2:15 PM Medical Record Number: [id number] Patient Account Number: [id number] Date of Birth/Gender: 04-15-1961 (61 y.o. M) Treating RN: Cornell Barman Primary Care Physician: [name] Other Clinician: Massie Kluver Referring Physician: [name] Treating Physician/Extender: Skipper Cliche in Treatment: 5 Education Assessment Education Provided To: Patient Education Topics Provided Wound/Skin Impairment: Handouts: Other: continue wound care as directed Methods: Explain/Verbal Responses: State content correctly Electronic Signature(s) Signed: 03/13/2022 5:04:43 PM By: Massie Kluver Entered By: Massie Kluver on 03/11/2022 15:16:05 [name] ([id number]) -------------------------------------------------------------------------------- Wound Assessment Details Patient Name: [name]. Date of Service: 03/11/2022 2:15 PM Medical Record Number: [id number] Patient Account Number: [id number] Date of Birth/Sex: 07-09-1960 (61 y.o. M) Treating RN: Cornell Barman Primary Care Theadora Noyes: [name] Other Clinician: Massie Kluver Referring Fedora Knisely: [name] Treating Breon Diss/Extender: Skipper Cliche in Treatment: 5 Wound Status Wound Number: 14 Primary Etiology: Diabetic Wound/Ulcer of the Lower  Extremity Wound Location: Left, Lateral Foot Secondary Etiology: Dehisced Wound Wounding Event: Blister Wound Status: Open Date Acquired: 10/29/2021 Comorbid History: Hypertension, Type II Diabetes, Neuropathy Weeks Of Treatment: 5 Clustered Wound: No Pending Amputation On Presentation Photos Wound Measurements Length: (cm) 5 % Red Width: (cm) 4.3 % Red Depth: (cm) 0.7 Epith Area: (cm) 16.886 Volume: (cm) 11.82 uction in Area: -56.4% uction in Volume: 56.2% elialization: None Wound Description Classification: Grade 3 Foul Wound Margin: Epibole Sloug Exudate Amount: Medium Exudate Type: Sanguinous Exudate Color: red Odor After Cleansing: No h/Fibrino Yes Wound Bed Granulation Amount: Medium (34-66%) Exposed Structure Granulation Quality: Red, Pink Fascia Exposed: No Necrotic Amount: Medium (34-66%) Fat Layer (Subcutaneous Tissue) Exposed: Yes Necrotic Quality: Adherent Slough Tendon Exposed: No Muscle Exposed: Yes Necrosis of Muscle: No Joint Exposed: No Bone Exposed: Yes Treatment Notes Wound #14 (Foot) Wound Laterality: Left, Lateral Cleanser [place] [facility] Kit - [address] [name], [name] ([id number]) Discharge Instruction: Use supplies as instructed; Kit contains: (15) Saline Bullets; (15) 3x3 Gauze; 15 pr Gloves Soap and Water Discharge Instruction: Gently cleanse wound with antibacterial soap, rinse and pat dry prior to dressing wounds Wound Cleanser Discharge Instruction: Wash your hands with soap and water. Remove old dressing, discard into plastic bag and place into trash. Cleanse the wound with Wound Cleanser prior to applying a clean dressing using gauze sponges, not tissues or cotton balls. Do not scrub or use excessive force. Pat dry using gauze sponges, not tissue or cotton balls. Peri-Wound Care Topical Primary Dressing Hydrofera Blue Ready Transfer Foam, 4x5 (in/in) Discharge Instruction: Apply Hydrofera Blue Ready to wound bed as  directed Secondary Dressing ABD Pad 5x9 (in/in) Discharge Instruction: Cover with ABD pad Secured With Medipore Tape - 42M Medipore H Soft Cloth Surgical Tape, 2x2 (in/yd) Kerlix Roll Sterile or Non-Sterile 6-ply 4.5x4 (  yd/yd) Discharge Instruction: Apply Kerlix as directed Compression Wrap Compression Stockings Add-Ons Electronic Signature(s) Signed: 03/11/2022 5:08:52 PM By: Gretta Cool, BSN, RN, CWS, Kim RN, BSN Signed: 03/13/2022 5:04:43 PM By: Massie Kluver Entered By: Massie Kluver on 03/11/2022 14:56:45 [name] ([id number]) -------------------------------------------------------------------------------- [place] Details Patient Name: [name], [name]. Date of Service: 03/11/2022 2:15 PM Medical Record Number: [id number] Patient Account Number: [id number] Date of Birth/Sex: 05-Oct-1960 (61 y.o. M) Treating RN: Cornell Barman Primary Care Loria Lacina: [name] Other Clinician: Massie Kluver Referring Sukhmani Fetherolf: [name] Treating Aizik Reh/Extender: Skipper Cliche in Treatment: 5 Vital Signs Time Taken: 14:42 Temperature (F): 97.9 Height (in): 70 Pulse (bpm): 101 Weight (lbs): 255 Respiratory Rate (breaths/min): 16 Body Mass Index (BMI): 36.6 Blood Pressure (mmHg): 120/70 Reference Range: 80 - 120 mg / dl Electronic Signature(s) Signed: 03/13/2022 5:04:43 PM By: Massie Kluver Entered By: Massie Kluver on 03/11/2022 14:45:45

## 2022-03-13 NOTE — Progress Notes (Signed)
[name] ([id number]) Visit Report for 03/13/2022 HBO Details Patient Name: [name], [name]. Date of Service: 03/13/2022 10:00 AM Medical Record Number: [id number] Patient Account Number: [id number] Date of Birth/Sex: [birth year] (61 y.o. 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 HBO Treatment Details Treatment Number: 15 Patient Type: Outpatient Chamber Type: Monoplace Chamber Serial #: [id number] Treatment Protocol: 2.0 ATA with 90 minutes oxygen, and no air breaks Treatment Details Compression Rate Down: 1.5 psi / minute De-Compression Rate Up: 1.5 psi / minute Compress Tx Pressure Air breaks and breathing periods Decompress Decompress Begins Reached (leave unused spaces blank) Begins Ends Chamber Pressure (ATA) 1 2 - - - - - - 2 1 Clock Time (24 hr) 10:31 10:43 - - - - - - 12:13 12:23 Treatment Length: 112 (minutes) Treatment Segments: 4 Vital Signs Capillary Blood Glucose Reference Range: 80 - 120 mg / dl HBO Diabetic Blood Glucose Intervention Range: <131 mg/dl or >249 mg/dl Time Vitals Blood Respiratory Capillary Blood Glucose Pulse Action Type: Pulse: Temperature: Taken: Pressure: Rate: Glucose (mg/dl): Meter #: Oximetry (%) Taken: Pre 10:25 122/64 60 16 98.1 244 1 none per protocol Post 12:33 132/64 72 16 97.7 171 1 none per protocol Treatment Response Treatment Toleration: Well Treatment Completion Treatment Completed without Adverse Event Status: HBO Attestation I certify that I supervised this HBO treatment in accordance with Medicare guidelines. A trained emergency response  team is readily Yes available per hospital policies and procedures. Continue HBOT as ordered. Yes Electronic Signature(s) Signed: 03/13/2022 1:04:14 PM By: Zackery Barefoot, RRT, CHT Signed: 03/13/2022 2:10:43 PM By: Kalman Shan DO Entered By: [name] on 03/13/2022 12:56:20 [name] ([id number]) -------------------------------------------------------------------------------- HBO Safety Checklist Details Patient Name: [name]. Date of Service: 03/13/2022 10:00 AM Medical Record Number: [id number] Patient Account Number: [id number] Date of Birth/Sex: 17-Dec-1960 (61 y.o. M) Treating RN: Cornell Barman Primary Care Eleesha Purkey: [name] Other Clinician: [name] Referring Mikaella Escalona: [name] Treating Jazarah Capili/Extender: Yaakov Guthrie in Treatment: 6 HBO Safety Checklist Items Safety Checklist Consent Form Signed Patient voided / foley secured and emptied When did you last eato 07:00 am Last dose of injectable or oral agent 07:00 am Ostomy pouch emptied and vented if applicable NA All implantable devices assessed, documented and approved NA Intravenous access site secured and place NA Valuables secured Linens and cotton and cotton/polyester blend (less than 51% polyester) Personal oil-based products / skin lotions / body lotions removed Wigs or hairpieces removed NA Smoking or tobacco materials removed NA Books / newspapers / magazines / loose paper removed NA Cologne, aftershave, perfume and deodorant removed Jewelry removed (may wrap wedding band) Make-up removed NA Hair care products removed Battery operated devices (external) removed NA Heating patches and chemical warmers removed NA Titanium eyewear removed NA Nail polish cured greater than 10 hours NA Casting material cured greater than 10 hours NA Hearing aids removed NA Loose dentures or partials removed NA Prosthetics have been  removed NA Patient demonstrates correct use of air break device (if applicable) Patient concerns have been addressed Patient grounding bracelet on and cord attached to chamber Specifics for Inpatients (complete in addition to above) Medication sheet sent with patient Intravenous medications needed or  due during therapy sent with patient Drainage tubes (e.g. nasogastric tube or chest tube secured and vented) Endotracheal or Tracheotomy tube secured Cuff deflated of air and inflated with saline Airway suctioned Electronic Signature(s) Signed: 03/13/2022 1:04:14 PM By: [name] RCP, RRT, CHT Entered By: [name] on 03/13/2022 11:01:04

## 2022-03-13 NOTE — Progress Notes (Signed)
OSHAY, STRANAHAN ([id number]) Visit Report for 03/13/2022 Arrival Information Details Patient Name: [name], [name]. Date of Service: 03/13/2022 10:00 AM Medical Record Number: [id number] Patient Account Number: [id number] Date of Birth/Sex: 02-May-1961 (61 y.o. M) Treating RN: Cornell Barman Primary Care Shani Fitch: [name] Other Clinician: [name] Referring Teola Felipe: [name] Treating Ruhani Umland/Extender: Yaakov Guthrie in Treatment: 6 Visit Information History Since Last Visit Added or deleted any medications: No Patient Arrived: Knee Scooter Any new allergies or adverse reactions: No Arrival Time: 10:20 Had a fall or experienced change in No Accompanied By: self activities of daily living that may affect Transfer Assistance: None risk of falls: Patient Identification Verified: Yes Signs or symptoms of abuse/neglect since last visito No Secondary Verification Process Completed: Yes Hospitalized since last visit: No Patient Requires Transmission-Based Precautions: No Implantable device outside of the clinic excluding No Patient Has Alerts: No cellular tissue based products placed in the center since last visit: Has Dressing in Place as Prescribed: Yes Pain Present Now: No Electronic Signature(s) Signed: 03/13/2022 1:04:14 PM By: [name] RCP, RRT, CHT Entered By: Stark Jock, Amado Nash on 03/13/2022 10:57:38 [name] ([id number]) -------------------------------------------------------------------------------- Encounter Discharge Information Details Patient Name: [name]. Date of Service: 03/13/2022 10:00 AM Medical Record Number: [id number] Patient Account Number: [id number] Date of Birth/Sex: 09-Jul-1960 (61 y.o. M) Treating RN: Cornell Barman Primary Care Kenitra Leventhal: [name] Other Clinician: [name] Referring Erez Mccallum: [name] Treating Cledis Sohn/Extender: Yaakov Guthrie in Treatment: 6 Encounter  Discharge Information Items Discharge Condition: Stable Ambulatory Status: Knee Scooter Discharge Destination: Home Transportation: Private Auto Accompanied By: self Schedule Follow-up Appointment: Yes Clinical Summary of Care: Notes Patient has an HBO treatment scheduled on 03/14/22 at 08:00 am. Electronic Signature(s) Signed: 03/13/2022 1:04:14 PM By: [name] RCP, RRT, CHT Entered By: [name] on 03/13/2022 12:57:13 [name] ([id number]) -------------------------------------------------------------------------------- Vitals Details Patient Name: [name]. Date of Service: 03/13/2022 10:00 AM Medical Record Number: [id number] Patient Account Number: [id number] Date of Birth/Sex: 1960/12/05 (61 y.o. M) Treating RN: Cornell Barman Primary Care Latasha Puskas: [name] Other Clinician: [name] Referring Bellah Alia: [name] Treating Rhesa Forsberg/Extender: Yaakov Guthrie in Treatment: 6 Vital Signs Time Taken: 10:25 Temperature (F): 98.1 Height (in): 70 Pulse (bpm): 60 Weight (lbs): 255 Respiratory Rate (breaths/min): 16 Body Mass Index (BMI): 36.6 Blood Pressure (mmHg): 122/64 Reference Range: 80 - 120 mg / dl Electronic Signature(s) Signed: 03/13/2022 1:04:14 PM By: [name] RCP, RRT, CHT Entered By: [name] on 03/13/2022 10:58:05

## 2022-03-14 ENCOUNTER — Encounter: Payer: 59 | Admitting: Physician Assistant

## 2022-03-14 ENCOUNTER — Ambulatory Visit: Payer: 59 | Admitting: Physician Assistant

## 2022-03-14 DIAGNOSIS — M86372 Chronic multifocal osteomyelitis, left ankle and foot: Secondary | ICD-10-CM | POA: Diagnosis not present

## 2022-03-14 LAB — GLUCOSE, CAPILLARY
Glucose-Capillary: 145 mg/dL — ABNORMAL HIGH (ref 70–99)
Glucose-Capillary: 125 mg/dL — ABNORMAL HIGH (ref 70–99)

## 2022-03-14 NOTE — Progress Notes (Signed)
SHAYE, LAGACE ([id number]) Visit Report for 03/14/2022 HBO Details Patient Name: [name], [name]. Date of Service: 03/14/2022 8:00 AM Medical Record Number: [id number] Patient Account Number: [id number] Date of Birth/Sex: 1960-09-04 (61 y.o. 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 Foot HBO Treatment Details Treatment Number: 16 Patient Type: Outpatient Chamber Type: Monoplace Chamber Serial #: [id number] Treatment Protocol: 2.0 ATA with 90 minutes oxygen, and no air breaks Treatment Details Compression Rate Down: 1.5 psi / minute De-Compression Rate Up: 1.5 psi / minute Compress Tx Pressure Air breaks and breathing periods Decompress Decompress Begins Reached (leave unused spaces blank) Begins Ends Chamber Pressure (ATA) 1 2 - - - - - - 2 1 Clock Time (24 hr) 08:18 08:29 - - - - - - 10:00 10:09 Treatment Length: 111 (minutes) Treatment Segments: 4 Vital Signs Capillary Blood Glucose Reference Range: 80 - 120 mg / dl HBO Diabetic Blood Glucose Intervention Range: <131 mg/dl or >249 mg/dl Time Vitals Blood Respiratory Capillary Blood Glucose Pulse Action Type: Pulse: Temperature: Taken: Pressure: Rate: Glucose (mg/dl): Meter #: Oximetry (%) Taken: Pre 08:01 122/64 84 16 97.9 145 1 none per protocol Post 10:27 132/68 72 16 98.2 125 1 none per protocol Treatment Response Treatment Toleration: Well Treatment Completion Treatment Completed without Adverse Event Status: Electronic Signature(s) Signed: 03/14/2022 11:10:55 AM By: Zackery Barefoot, RRT, CHT Signed: 03/14/2022 4:51:32 PM By: Worthy Keeler PA-C Entered By: [name] on 03/14/2022 11:07:37 [name] ([id number]) -------------------------------------------------------------------------------- HBO Safety Checklist Details Patient Name: [name]. Date of Service: 03/14/2022 8:00 AM Medical Record Number: [id number] Patient Account Number: [id number] Date of Birth/Sex: 09/29/1960 (61 y.o. M) Treating RN: Cornell Barman Primary Care Genesia Caslin: [name] Other Clinician: [name] Referring Zandria Woldt: [name] Treating Landi Biscardi/Extender: Skipper Cliche in Treatment: 6 HBO Safety Checklist Items Safety Checklist Consent Form Signed Patient voided / foley secured and emptied When did you last eato 07:00 am Last dose of injectable or oral agent 07:00 am Ostomy pouch emptied and vented if applicable NA All implantable devices assessed, documented and approved NA Intravenous access site secured and place NA Valuables secured Linens and cotton and cotton/polyester blend (less than 51% polyester) Personal oil-based products / skin lotions / body lotions removed Wigs or hairpieces removed NA Smoking or tobacco materials removed NA Books / newspapers / magazines / loose paper removed NA Cologne, aftershave, perfume and deodorant removed Jewelry removed (may wrap wedding band) Make-up removed NA Hair care products removed Battery operated devices (external) removed NA Heating patches and chemical warmers removed NA Titanium eyewear removed NA Nail polish cured greater than 10 hours NA Casting material cured greater than 10 hours NA Hearing aids removed NA Loose dentures or partials removed NA Prosthetics have been removed NA Patient demonstrates correct use of air break device (if applicable) NA Patient concerns have been addressed NA NA Patient grounding bracelet on and cord attached to chamber Specifics for Inpatients (complete in addition  to above) Medication sheet sent with patient Intravenous medications needed or due during therapy sent with patient Drainage tubes (e.g. nasogastric tube or chest tube secured and vented) Endotracheal or Tracheotomy tube secured Cuff deflated of air and inflated with saline  Airway suctioned Electronic Signature(s) Signed: 03/14/2022 11:10:55 AM By: [name] RCP, RRT, CHT Entered By: [name] on 03/14/2022 08:37:36

## 2022-03-14 NOTE — Progress Notes (Signed)
KAHLEEL, FADELEY ([id number]) Visit Report for 03/14/2022 Arrival Information Details Patient Name: [name], [name]. Date of Service: 03/14/2022 8:00 AM Medical Record Number: [id number] Patient Account Number: [id number] Date of Birth/Sex: 1961/06/20 (61 y.o. M) Treating RN: Cornell Barman Primary Care Jenet Durio: [name] Other Clinician: [name] Referring Lamoine Magallon: [name] Treating Brea Coleson/Extender: Skipper Cliche in Treatment: 6 Visit Information History Since Last Visit Added or deleted any medications: No Patient Arrived: Knee Scooter Any new allergies or adverse reactions: No Arrival Time: 07:55 Had a fall or experienced change in No Accompanied By: self activities of daily living that may affect Transfer Assistance: None risk of falls: Patient Identification Verified: Yes Signs or symptoms of abuse/neglect since last visito No Secondary Verification Process Completed: Yes Hospitalized since last visit: No Patient Requires Transmission-Based Precautions: No Implantable device outside of the clinic excluding No Patient Has Alerts: No cellular tissue based products placed in the center since last visit: Has Dressing in Place as Prescribed: Yes Has Footwear/Offloading in Place as Prescribed: Yes Left: Removable Cast Walker/Walking Boot Pain Present Now: No Electronic Signature(s) Signed: 03/14/2022 11:10:55 AM By: [name] RCP, RRT, CHT Entered By: [name] on 03/14/2022 08:32:04 [name] ([id number]) -------------------------------------------------------------------------------- Encounter Discharge Information Details Patient Name: [name], [name]. Date of Service: 03/14/2022 8:00 AM Medical Record Number: [id number] Patient Account Number: [id number] Date of Birth/Sex: 10-Apr-1961 (61 y.o. M) Treating RN: Cornell Barman Primary Care Sheika [name]: [name] Other Clinician: [name] Referring Ia Leeb:  [name] Treating Ameliyah Sarno/Extender: Skipper Cliche in Treatment: 6 Encounter Discharge Information Items Discharge Condition: Stable Ambulatory Status: Knee Scooter Discharge Destination: Home Transportation: Private Auto Accompanied By: self Schedule Follow-up Appointment: Yes Clinical Summary of Care: Notes Patient has an HBO treatment scheduled on 03/15/22 at 9:30 am. Electronic Signature(s) Signed: 03/14/2022 11:10:55 AM By: [name] RCP, RRT, CHT Entered By: [name] on 03/14/2022 11:10:20 [name] ([id number]) -------------------------------------------------------------------------------- Vitals Details Patient Name: [name]. Date of Service: 03/14/2022 8:00 AM Medical Record Number: [id number] Patient Account Number: [id number] Date of Birth/Sex: 1961-02-14 (61 y.o. M) Treating RN: Cornell Barman Primary Care Johni Narine: [name] Other Clinician: [name] Referring Tieler Cournoyer: [name] Treating Talon Witting/Extender: Skipper Cliche in Treatment: 6 Vital Signs Time Taken: 08:01 Temperature (F): 97.9 Height (in): 70 Pulse (bpm): 84 Weight (lbs): 255 Respiratory Rate (breaths/min): 16 Body Mass Index (BMI): 36.6 Blood Pressure (mmHg): 122/64 Capillary Blood Glucose (mg/dl): 145 Reference Range: 80 - 120 mg / dl Electronic Signature(s) Signed: 03/14/2022 11:10:55 AM By: [name] RCP, RRT, CHT Entered By: Stark Jock, Amado Nash on 03/14/2022 08:36:25

## 2022-03-15 ENCOUNTER — Encounter: Payer: 59 | Admitting: Physician Assistant

## 2022-03-15 DIAGNOSIS — M86372 Chronic multifocal osteomyelitis, left ankle and foot: Secondary | ICD-10-CM | POA: Diagnosis not present

## 2022-03-15 LAB — GLUCOSE, CAPILLARY
Glucose-Capillary: 138 mg/dL — ABNORMAL HIGH (ref 70–99)
Glucose-Capillary: 144 mg/dL — ABNORMAL HIGH (ref 70–99)

## 2022-03-15 NOTE — Progress Notes (Signed)
TRAVIN, MARIK ([id number]) Visit Report for 03/15/2022 Arrival Information Details Patient Name: [name], [name]. Date of Service: 03/15/2022 10:00 AM Medical Record Number: [id number] Patient Account Number: [id number] Date of Birth/Sex: [birth year] (61 y.o. M) Treating RN: Cornell Barman Primary Care Raevon Broom: [name] Other Clinician: [name] Referring Keyauna Graefe: [name] Treating Edem Tiegs/Extender: Skipper Cliche in Treatment: 6 Visit Information History Since Last Visit Added or deleted any medications: No Patient Arrived: Knee Scooter Any new allergies or adverse reactions: No Arrival Time: 09:30 Had a fall or experienced change in No Accompanied By: self activities of daily living that may affect Transfer Assistance: None risk of falls: Patient Identification Verified: Yes Signs or symptoms of abuse/neglect since last visito No Secondary Verification Process Completed: Yes Hospitalized since last visit: No Patient Requires Transmission-Based Precautions: No Implantable device outside of the clinic excluding No Patient Has Alerts: No cellular tissue based products placed in the center since last visit: Has Dressing in Place as Prescribed: Yes Pain Present Now: No Electronic Signature(s) Signed: 03/15/2022 12:49:20 PM By: [name] RCP, RRT, CHT Entered By: Stark Jock, Amado Nash on 03/15/2022 12:45:12 [name] ([id number]) -------------------------------------------------------------------------------- Encounter Discharge Information Details Patient Name: [name]. Date of Service: 03/15/2022 10:00 AM Medical Record Number: [id number] Patient Account Number: [id number] Date of Birth/Sex: [birth year] (62 y.o. M) Treating RN: Cornell Barman Primary Care Jaina Morin: [name] Other Clinician: [name] Referring Diamonte Stavely: [name] Treating Esmay Amspacher/Extender: Skipper Cliche in Treatment: 6 Encounter Discharge  Information Items Discharge Condition: Stable Ambulatory Status: Knee Scooter Discharge Destination: Home Transportation: Private Auto Accompanied By: self Schedule Follow-up Appointment: Yes Clinical Summary of Care: Notes Patient has an HBO treatment scheduled on 03/18/22 at 08:00 am. Electronic Signature(s) Signed: 03/15/2022 12:49:20 PM By: [name] RCP, RRT, CHT Entered By: [name] on 03/15/2022 12:48:59 [name] ([id number]) -------------------------------------------------------------------------------- Vitals Details Patient Name: [name]. Date of Service: 03/15/2022 10:00 AM Medical Record Number: [id number] Patient Account Number: [id number] Date of Birth/Sex: [birth year] (61 y.o. M) Treating RN: Cornell Barman Primary Care Hosanna Betley: [name] Other Clinician: [name] Referring Shonette Rhames: [name] Treating Glenmore Karl/Extender: Skipper Cliche in Treatment: 6 Vital Signs Time Taken: 09:34 Temperature (F): 97.8 Height (in): 70 Pulse (bpm): 78 Weight (lbs): 255 Respiratory Rate (breaths/min): 16 Body Mass Index (BMI): 36.6 Blood Pressure (mmHg): 112/60 Capillary Blood Glucose (mg/dl): 138 Reference Range: 80 - 120 mg / dl Electronic Signature(s) Signed: 03/15/2022 12:49:20 PM By: [name] RCP, RRT, CHT Entered By: [name] on 03/15/2022 12:45:50

## 2022-03-15 NOTE — Progress Notes (Signed)
CORDEL, DREWES ([id number]) Visit Report for 03/15/2022 HBO Details Patient Name: [name], [name]. Date of Service: 03/15/2022 10:00 AM Medical Record Number: [id number] Patient Account Number: [id number] Date of Birth/Sex: January 25, 1961 (61 y.o. M) Treating RN: Cornell Barman Primary Care Azra Abrell: [name] Other Clinician: [name] Referring Naftuli Dalsanto: [name] Treating Ahaan Zobrist/Extender: Skipper Cliche in Treatment: 6 HBO Treatment Course Details Treatment Course Number: 1 Ordering Marlen Koman: [name] Total Treatments Ordered: 40 HBO Treatment Start Date: 02/18/2022 HBO Indication: Chronic Refractory Osteomyelitis to Left Ankle and Foot HBO Treatment Details Treatment Number: 17 Patient Type: Outpatient Chamber Type: Monoplace Chamber Serial #: [id number] Treatment Protocol: 2.0 ATA with 90 minutes oxygen, and no air breaks Treatment Details Compression Rate Down: 1.5 psi / minute De-Compression Rate Up: 1.5 psi / minute Compress Tx Pressure Air breaks and breathing periods Decompress Decompress Begins Reached (leave unused spaces blank) Begins Ends Chamber Pressure (ATA) 1 2 - - - - - - 2 1 Clock Time (24 hr) 10:13 10:24 - - - - - - 11:54 12:05 Treatment Length: 112 (minutes) Treatment Segments: 4 Vital Signs Capillary Blood Glucose Reference Range: 80 - 120 mg / dl HBO Diabetic Blood Glucose Intervention Range: <131 mg/dl or >249 mg/dl Time Vitals Blood Respiratory Capillary Blood Glucose Pulse Action Type: Pulse: Temperature: Taken: Pressure: Rate: Glucose (mg/dl): Meter #: Oximetry (%) Taken: Pre 09:34 112/60 78 16 97.8 138 1 none per protocol Post 12:18 112/68 72 16 97.8 144 1 none per protocol Treatment Response Treatment Toleration: Well Treatment Completion Treatment Completed without Adverse Event Status: Electronic Signature(s) Signed: 03/15/2022 12:49:20 PM By: [name] RCP, RRT, CHT Signed: 03/15/2022 1:48:50 PM By: Worthy Keeler PA-C Entered By: Stark Jock, Amado Nash on 03/15/2022 12:48:10 [name] ([id number]) -------------------------------------------------------------------------------- HBO Safety Checklist Details Patient Name: [name]. Date of Service: 03/15/2022 10:00 AM Medical Record Number: [id number] Patient Account Number: [id number] Date of Birth/Sex: Nov 30, 1960 (61 y.o. M) Treating RN: Cornell Barman Primary Care Careli Luzader: [name] Other Clinician: [name] Referring Evrett Hakim: [name] Treating Angelica Wix/Extender: Skipper Cliche in Treatment: 6 HBO Safety Checklist Items Safety Checklist Consent Form Signed Patient voided / foley secured and emptied When did you last eato 07:00 am Last dose of injectable or oral agent 03/14/22 pm Ostomy pouch emptied and vented if applicable NA All implantable devices assessed, documented and approved NA Intravenous access site secured and place NA Valuables secured Linens and cotton and cotton/polyester blend (less than 51% polyester) Personal oil-based products / skin lotions / body lotions removed Wigs or hairpieces removed NA Smoking or tobacco materials removed NA Books / newspapers / magazines / loose paper removed NA Cologne, aftershave, perfume and deodorant removed Jewelry removed (may wrap wedding band) Make-up removed NA Hair care products removed Battery operated devices (external) removed NA Heating patches and chemical warmers removed NA Titanium eyewear removed NA Nail polish cured greater than 10 hours NA Casting material cured greater than 10 hours NA Hearing aids removed NA Loose dentures or partials removed NA Prosthetics have been removed NA Patient demonstrates correct use of air break device (if applicable) Patient concerns have been addressed Patient grounding bracelet on and cord attached to chamber Specifics for Inpatients (complete in addition to above) Medication  sheet sent with patient Intravenous medications needed or due during therapy sent with patient Drainage tubes (e.g. nasogastric tube or chest tube secured and vented) Endotracheal or Tracheotomy tube secured Cuff deflated of air and inflated with saline Airway suctioned Electronic  Signature(s) Signed: 03/15/2022 12:49:20 PM By: [name] RCP, RRT, CHT Entered By: [name] on 03/15/2022 12:47:04

## 2022-03-18 ENCOUNTER — Encounter: Payer: 59 | Admitting: Physician Assistant

## 2022-03-18 DIAGNOSIS — M86372 Chronic multifocal osteomyelitis, left ankle and foot: Secondary | ICD-10-CM | POA: Diagnosis not present

## 2022-03-18 LAB — GLUCOSE, CAPILLARY
Glucose-Capillary: 201 mg/dL — ABNORMAL HIGH (ref 70–99)
Glucose-Capillary: 182 mg/dL — ABNORMAL HIGH (ref 70–99)

## 2022-03-18 NOTE — Progress Notes (Signed)
[name] ([id number]) Visit Report for 03/18/2022 HBO Details Patient Name: [name], [name] Date of Service: 03/18/2022 8:00 AM Medical Record Number: [id number] Patient Account Number: [id number] Date of Birth/Sex: [birth year] (61 y.o. M) Treating RN: Carlene Coria Primary Care Kevon Tench: [name] Other Clinician: [name] Referring Linc Renne: [name] Treating Tamaria Dunleavy/Extender: Skipper Cliche in Treatment: 6 HBO Treatment Course Details Treatment Course Number: 1 Ordering Ellaina Schuler: [name] Total Treatments Ordered: 40 HBO Treatment Start Date: 02/18/2022 HBO Indication: Chronic Refractory Osteomyelitis to Left Ankle and Foot HBO Treatment Details Treatment Number: 18 Patient Type: Outpatient Chamber Type: Monoplace Chamber Serial #: [id number] Treatment Protocol: 2.0 ATA with 90 minutes oxygen, and no air breaks Treatment Details Compression Rate Down: 1.5 psi / minute De-Compression Rate Up: 1.5 psi / minute Compress Tx Pressure Air breaks and breathing periods Decompress Decompress Begins Reached (leave unused spaces blank) Begins Ends Chamber Pressure (ATA) 1 2 - - - - - - 2 1 Clock Time (24 hr) 08:21 08:32 - - - - - - 10:03 10:13 Treatment Length: 112 (minutes) Treatment Segments: 4 Vital Signs Capillary Blood Glucose Reference Range: 80 - 120 mg / dl HBO Diabetic Blood Glucose Intervention Range: <131 mg/dl or >249 mg/dl Time Vitals Blood Respiratory Capillary Blood Glucose Pulse Action Type: Pulse: Temperature: Taken: Pressure: Rate: Glucose (mg/dl): Meter #: Oximetry (%) Taken: Pre 08:14 122/68 84 16 97.8 201 1 none per protocol Post 10:23 132/62 72 16 98 182 1 none per protocol Treatment Response Treatment Toleration: Well Treatment Completion Treatment Completed without Adverse Event Status: Electronic Signature(s) Signed: 03/18/2022 12:01:07 PM By: [name] RCP, RRT, CHT Signed: 03/18/2022 4:49:05 PM By: Worthy Keeler PA-C Entered By: Stark Jock, Amado Nash on 03/18/2022 11:58:39 [name] ([id number]) -------------------------------------------------------------------------------- HBO Safety Checklist Details Patient Name: [name]. Date of Service: 03/18/2022 8:00 AM Medical Record Number: [id number] Patient Account Number: [id number] Date of Birth/Sex: 1960-10-21 (61 y.o. M) Treating RN: Carlene Coria Primary Care Lainy Wrobleski: [name] Other Clinician: [name] Referring Dulce Martian: [name] Treating Benedetto Ryder/Extender: Skipper Cliche in Treatment: 6 HBO Safety Checklist Items Safety Checklist Consent Form Signed Patient voided / foley secured and emptied When did you last eato 07:00 am Last dose of injectable or oral agent 03/17/22 pm Ostomy pouch emptied and vented if applicable NA All implantable devices assessed, documented and approved NA Intravenous access site secured and place NA Valuables secured Linens and cotton and cotton/polyester blend (less than 51% polyester) Personal oil-based products / skin lotions / body lotions removed Wigs or hairpieces removed NA Smoking or tobacco materials removed NA Books / newspapers / magazines / loose paper removed NA Cologne, aftershave, perfume and deodorant removed Jewelry removed (may wrap wedding band) Make-up removed NA Hair care products removed Battery operated devices (external) removed NA Heating patches and chemical warmers removed NA Titanium eyewear removed NA Nail polish cured greater than 10 hours NA Casting material cured greater than 10 hours NA Hearing aids removed NA Loose dentures or partials removed NA Prosthetics have been removed NA Patient demonstrates correct use of air break device (if applicable) Patient concerns have been addressed Patient grounding bracelet on and cord attached to chamber Specifics for Inpatients (complete in addition to above) Medication  sheet sent with patient Intravenous medications needed or due during therapy sent with patient Drainage tubes (e.g. nasogastric tube or chest tube secured and vented) Endotracheal or Tracheotomy tube secured Cuff deflated of air and inflated with saline Airway suctioned Electronic  Signature(s) Signed: 03/18/2022 12:01:07 PM By: [name] RCP, RRT, CHT Entered By: [name] on 03/18/2022 08:43:37

## 2022-03-18 NOTE — Progress Notes (Signed)
ABDELRAHMAN, NAIR ([id number]) Visit Report for 03/18/2022 Arrival Information Details Patient Name: [name], [name]. Date of Service: 03/18/2022 8:00 AM Medical Record Number: [id number] Patient Account Number: [id number] Date of Birth/Sex: 1961/07/01 (61 y.o. M) Treating RN: Carlene Coria Primary Care Felice Hope: [name] Other Clinician: [name] Referring Pritesh Sobecki: [name] Treating Keaghan Staton/Extender: Skipper Cliche in Treatment: 6 Visit Information History Since Last Visit Added or deleted any medications: No Patient Arrived: Knee Scooter Any new allergies or adverse reactions: No Arrival Time: 08:00 Had a fall or experienced change in No Accompanied By: self activities of daily living that may affect Transfer Assistance: None risk of falls: Patient Identification Verified: Yes Signs or symptoms of abuse/neglect since last visito No Secondary Verification Process Completed: Yes Hospitalized since last visit: No Patient Requires Transmission-Based Precautions: No Implantable device outside of the clinic excluding No Patient Has Alerts: No cellular tissue based products placed in the center since last visit: Has Dressing in Place as Prescribed: Yes Has Footwear/Offloading in Place as Prescribed: Yes Left: Removable Cast Walker/Walking Boot Pain Present Now: No Electronic Signature(s) Signed: 03/18/2022 12:01:07 PM By: [name] RCP, RRT, CHT Entered By: [name] on 03/18/2022 08:40:38 [name] ([id number]) -------------------------------------------------------------------------------- Encounter Discharge Information Details Patient Name: [name]. Date of Service: 03/18/2022 8:00 AM Medical Record Number: [id number] Patient Account Number: [id number] Date of Birth/Sex: 20-Oct-1960 (61 y.o. M) Treating RN: Carlene Coria Primary Care Cecil Bixby: [name] Other Clinician: [name] Referring Layaan Mott:  [name] Treating Jesstin Studstill/Extender: Skipper Cliche in Treatment: 6 Encounter Discharge Information Items Discharge Condition: Stable Ambulatory Status: Knee Scooter Discharge Destination: Home Transportation: Private Auto Accompanied By: self Schedule Follow-up Appointment: Yes Clinical Summary of Care: Notes Patient has an HBO treatment scheduled on 03/19/22 at 10:00 am. Electronic Signature(s) Signed: 03/18/2022 12:01:07 PM By: [name] RCP, RRT, CHT Entered By: [name] on 03/18/2022 12:00:06 [name] ([id number]) -------------------------------------------------------------------------------- Vitals Details Patient Name: [name]. Date of Service: 03/18/2022 8:00 AM Medical Record Number: [id number] Patient Account Number: [id number] Date of Birth/Sex: 1960/08/12 (61 y.o. M) Treating RN: Carlene Coria Primary Care Darrel Gloss: [name] Other Clinician: [name] Referring Pamla Pangle: [name] Treating Haruko Mersch/Extender: Skipper Cliche in Treatment: 6 Vital Signs Time Taken: 08:14 Temperature (F): 97.8 Height (in): 70 Pulse (bpm): 84 Weight (lbs): 255 Respiratory Rate (breaths/min): 16 Body Mass Index (BMI): 36.6 Blood Pressure (mmHg): 122/68 Capillary Blood Glucose (mg/dl): 201 Reference Range: 80 - 120 mg / dl Electronic Signature(s) Signed: 03/18/2022 12:01:07 PM By: [name] RCP, RRT, CHT Entered By: Stark Jock, Amado Nash on 03/18/2022 08:41:26

## 2022-03-19 ENCOUNTER — Encounter: Payer: 59 | Admitting: Physician Assistant

## 2022-03-19 DIAGNOSIS — M86372 Chronic multifocal osteomyelitis, left ankle and foot: Secondary | ICD-10-CM | POA: Diagnosis not present

## 2022-03-19 LAB — GLUCOSE, CAPILLARY
Glucose-Capillary: 144 mg/dL — ABNORMAL HIGH (ref 70–99)
Glucose-Capillary: 141 mg/dL — ABNORMAL HIGH (ref 70–99)

## 2022-03-19 NOTE — Progress Notes (Signed)
CANYON, LOHR ([id number]) Visit Report for 03/19/2022 Arrival Information Details Patient Name: [name], [name]. Date of Service: 03/19/2022 10:00 AM Medical Record Number: [id number] Patient Account Number: [id number] Date of Birth/Sex: June 26, 1961 (61 y.o. M) Treating RN: Carlene Coria Primary Care Daniah Zaldivar: [name] Other Clinician: [name] Referring Eliette Drumwright: [name] Treating Javani Spratt/Extender: Skipper Cliche in Treatment: 7 Visit Information History Since Last Visit Added or deleted any medications: No Patient Arrived: Knee Scooter Any new allergies or adverse reactions: No Arrival Time: 09:49 Had a fall or experienced change in No Accompanied By: self activities of daily living that may affect Transfer Assistance: None risk of falls: Patient Identification Verified: Yes Signs or symptoms of abuse/neglect since last visito No Secondary Verification Process Completed: Yes Hospitalized since last visit: No Patient Requires Transmission-Based Precautions: No Implantable device outside of the clinic excluding No Patient Has Alerts: No cellular tissue based products placed in the center since last visit: Has Dressing in Place as Prescribed: Yes Has Footwear/Offloading in Place as Prescribed: Yes Left: Removable Cast Walker/Walking Boot Pain Present Now: No Electronic Signature(s) Signed: 03/19/2022 1:29:53 PM By: [name] RCP, RRT, CHT Entered By: [name] on 03/19/2022 09:50:22 [name] ([id number]) -------------------------------------------------------------------------------- Vitals Details Patient Name: [name]. Date of Service: 03/19/2022 10:00 AM Medical Record Number: [id number] Patient Account Number: [id number] Date of Birth/Sex: 1960/11/06 (61 y.o. M) Treating RN: Carlene Coria Primary Care Calissa Swenor: [name] Other Clinician: [name] Referring Tracen Mahler: [name] Treating  Greyden Besecker/Extender: Skipper Cliche in Treatment: 7 Vital Signs Time Taken: 09:56 Temperature (F): 97.8 Height (in): 70 Pulse (bpm): 105 Weight (lbs): 255 Respiratory Rate (breaths/min): 18 Body Mass Index (BMI): 36.6 Blood Pressure (mmHg): 116/68 Capillary Blood Glucose (mg/dl): 144 Reference Range: 80 - 120 mg / dl Electronic Signature(s) Signed: 03/19/2022 1:29:53 PM By: [name] RCP, RRT, CHT Entered By: [name] on 03/19/2022 11:02:19

## 2022-03-19 NOTE — Progress Notes (Signed)
[name], [name] ([id number]) Visit Report for 03/19/2022 Chief Complaint Document Details Patient Name: [name], [name]. Date of Service: 03/19/2022 9:00 AM Medical Record Number: [id number] Patient Account Number: [id number] Date of Birth/Sex: Nov 19, 1960 (61 y.o. M) Treating RN: Carlene Coria Primary Care Provider: [name] Other Clinician: Massie Kluver Referring Provider: [name] Treating Provider/Extender: Skipper Cliche in Treatment: 7 Information Obtained from: Patient Chief Complaint Left lateral foot ulcer Electronic Signature(s) Signed: 03/19/2022 9:22:19 AM By: Worthy Keeler PA-C Entered By: Worthy Keeler on 03/19/2022 09:22:19 [name], [name] ([id number]) -------------------------------------------------------------------------------- Problem List Details Patient Name: [name], [name]. Date of Service: 03/19/2022 9:00 AM Medical Record Number: [id number] Patient Account Number: [id number] Date of Birth/Sex: 1960-11-10 (61 y.o. 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

## 2022-03-19 NOTE — Progress Notes (Signed)
SHIRLEY, BOLLE ([id number]) Visit Report for 03/19/2022 HBO Details Patient Name: [name], [name]. Date of Service: 03/19/2022 10:00 AM Medical Record Number: [id number] Patient Account Number: [id number] Date of Birth/Sex: [birth year] (61 y.o. 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 HBO Treatment Details Treatment Number: 19 Patient Type: Outpatient Chamber Type: Monoplace Chamber Serial #: [id number] Treatment Protocol: 2.0 ATA with 90 minutes oxygen, and no air breaks Treatment Details Compression Rate Down: 1.5 psi / minute De-Compression Rate Up: 1.5 psi / minute Compress Tx Pressure Air breaks and breathing periods Decompress Decompress Begins Reached (leave unused spaces blank) Begins Ends Chamber Pressure (ATA) 1 2 - - - - - - 2 1 Clock Time (24 hr) 10:04 10:15 - - - - - - 11:45 11:55 Treatment Length: 111 (minutes) Treatment Segments: 4 Vital Signs Capillary Blood Glucose Reference Range: 80 - 120 mg / dl HBO Diabetic Blood Glucose Intervention Range: <131 mg/dl or >249 mg/dl Time Vitals Blood Respiratory Capillary Blood Glucose Pulse Action Type: Pulse: Temperature: Taken: Pressure: Rate: Glucose (mg/dl): Meter #: Oximetry (%) Taken: Pre 09:56 116/68 105 18 97.8 144 1 none per protocol Post 12:09 120/70 84 18 98 141 1 none per protocol Treatment Response Treatment Toleration: Well Treatment Completion Treatment Completed without Adverse Event Status: Electronic Signature(s) Signed: 03/19/2022 1:29:53 PM By: [name] RCP, RRT, CHT Signed: 03/19/2022 5:18:45 PM By: Worthy Keeler PA-C Entered By: Stark Jock, Amado Nash on 03/19/2022 12:17:45 [name] ([id number]) -------------------------------------------------------------------------------- HBO Safety Checklist Details Patient Name: [name]. Date of Service: 03/19/2022 10:00 AM Medical Record Number: [id number] Patient Account Number: [id number] Date of Birth/Sex: April 05, 1961 (61 y.o. M) Treating RN: Carlene Coria Primary Care Danasia Baker: [name] Other Clinician: [name] Referring Saidah Kempton: [name] Treating Nealy Karapetian/Extender: Skipper Cliche in Treatment: 7 HBO Safety Checklist Items Safety Checklist Consent Form Signed Patient voided / foley secured and emptied When did you last eato 07:00 am Last dose of injectable or oral agent 07:00 am Ostomy pouch emptied and vented if applicable NA All implantable devices assessed, documented and approved NA Intravenous access site secured and place NA Valuables secured Linens and cotton and cotton/polyester blend (less than 51% polyester) Personal oil-based products / skin lotions / body lotions removed Wigs or hairpieces removed NA Smoking or tobacco materials removed NA Books / newspapers / magazines / loose paper removed NA Cologne, aftershave, perfume and deodorant removed Jewelry removed (may wrap wedding band) Make-up removed NA Hair care products removed Battery operated devices (external) removed NA Heating patches and chemical warmers removed NA Titanium eyewear removed NA Nail polish cured greater than 10 hours NA Casting material cured greater than 10 hours NA Hearing aids removed NA Loose dentures or partials removed NA Prosthetics have been removed NA Patient demonstrates correct use of air break device (if applicable) Patient concerns have been addressed Patient grounding bracelet on and cord attached to chamber Specifics for Inpatients (complete in addition to above) Medication  sheet sent with patient Intravenous medications needed or due during therapy sent with patient Drainage tubes (e.g. nasogastric tube or chest tube secured and vented) Endotracheal or Tracheotomy tube secured Cuff deflated of air and inflated with saline Airway suctioned Electronic  Signature(s) Signed: 03/19/2022 1:29:53 PM By: [name] RCP, RRT, CHT Entered By: [name] on 03/19/2022 11:03:55

## 2022-03-20 ENCOUNTER — Encounter (HOSPITAL_BASED_OUTPATIENT_CLINIC_OR_DEPARTMENT_OTHER): Payer: 59 | Admitting: Internal Medicine

## 2022-03-20 DIAGNOSIS — L97524 Non-pressure chronic ulcer of other part of left foot with necrosis of bone: Secondary | ICD-10-CM | POA: Diagnosis not present

## 2022-03-20 DIAGNOSIS — E11621 Type 2 diabetes mellitus with foot ulcer: Secondary | ICD-10-CM | POA: Diagnosis not present

## 2022-03-20 DIAGNOSIS — M86372 Chronic multifocal osteomyelitis, left ankle and foot: Secondary | ICD-10-CM

## 2022-03-20 LAB — GLUCOSE, CAPILLARY
Glucose-Capillary: 104 mg/dL — ABNORMAL HIGH (ref 70–99)
Glucose-Capillary: 129 mg/dL — ABNORMAL HIGH (ref 70–99)
Glucose-Capillary: 208 mg/dL — ABNORMAL HIGH (ref 70–99)

## 2022-03-20 NOTE — Progress Notes (Signed)
SAGE, KOPERA ([id number]) Visit Report for 03/20/2022 HBO Details Patient Name: [name], [name]. Date of Service: 03/20/2022 9:00 AM Medical Record Number: [id number] Patient Account Number: [id number] Date of Birth/Sex: Jun 06, 1961 (61 y.o. 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 HBO Treatment Details Treatment Number: 20 Patient Type: Outpatient Chamber Type: Monoplace Chamber Serial #: [id number] Treatment Protocol: 2.0 ATA with 90 minutes oxygen, and no air breaks Treatment Details Compression Rate Down: 1.5 psi / minute De-Compression Rate Up: 1.5 psi / minute Compress Tx Pressure Air breaks and breathing periods Decompress Decompress Begins Reached (leave unused spaces blank) Begins Ends Chamber Pressure (ATA) 1 2 - - - - - - 2 1 Clock Time (24 hr) 09:23 09:35 - - - - - - 11:05 11:15 Treatment Length: 112 (minutes) Treatment Segments: 4 Vital Signs Capillary Blood Glucose Reference Range: 80 - 120 mg / dl HBO Diabetic Blood Glucose Intervention Range: <131 mg/dl or >249 mg/dl Time Capillary Blood Glucose Pulse Blood Respiratory Action Type: Vitals Pulse: Temperature: Glucose Meter Oximetry Pressure: Rate: Taken: Taken: (mg/dl): #: (%) Gave Ensure x2 per Dr. Heber [place], Pre 08:54 122/62 72 18 97.9 104 1 DO Per DO recheck at 20 min; proceed Pre 09:16 129 1 w/ HBO Post 11:27 118/68 72 16 97.6 208 1 none per protocol Treatment Response Treatment Toleration: Well Treatment Completion Treatment Completed without Adverse Event Status: HBO Attestation I certify that I supervised this HBO  treatment in accordance with Medicare guidelines. A trained emergency response team is readily Yes available per hospital policies and procedures. Continue HBOT as ordered. Yes Electronic Signature(s) Signed: 03/20/2022 1:21:20 PM By: Kalman Shan DO Previous Signature: 03/20/2022 12:43:16 PM Version By: [name] RCP, RRT, CHT Entered By: Kalman Shan on 03/20/2022 13:19:46 [name] ([id number]) -------------------------------------------------------------------------------- HBO Safety Checklist Details Patient Name: [name]. Date of Service: 03/20/2022 9:00 AM Medical Record Number: [id number] Patient Account Number: [id number] Date of Birth/Sex: Apr 12, 1961 (61 y.o. M) Treating RN: Carlene Coria Primary Care Zamar Odwyer: [name] Other Clinician: [name] Referring Deserea Bordley: [name] Treating Finas Delone/Extender: Yaakov Guthrie in Treatment: 7 HBO Safety Checklist Items Safety Checklist Consent Form Signed Patient voided / foley secured and emptied When did you last eato 03/19/22 pm Last dose of injectable or oral agent 03/19/22 pm Ostomy pouch emptied and vented if applicable NA All implantable devices assessed, documented and approved NA Intravenous access site secured and place NA Valuables secured Linens and cotton and cotton/polyester blend (less than 51% polyester) Personal oil-based products / skin lotions / body lotions removed Wigs or hairpieces removed NA Smoking or tobacco materials removed NA Books / newspapers / magazines / loose paper removed NA Cologne, aftershave, perfume and deodorant removed Jewelry removed (may wrap wedding band) Make-up removed NA Hair care products removed Battery operated devices (external) removed NA Heating patches and chemical warmers removed NA Titanium eyewear removed NA Nail polish cured greater than 10 hours NA Casting material cured greater than 10  hours NA Hearing aids removed NA Loose dentures or partials removed NA Prosthetics have been removed NA Patient demonstrates correct use of air break device (if applicable) Patient concerns have been addressed Patient grounding bracelet on and cord attached to  chamber Specifics for Inpatients (complete in addition to above) Medication sheet sent with patient Intravenous medications needed or due during therapy sent with patient Drainage tubes (e.g. nasogastric tube or chest tube secured and vented) Endotracheal or Tracheotomy tube secured Cuff deflated of air and inflated with saline Airway suctioned Electronic Signature(s) Signed: 03/20/2022 12:43:16 PM By: [name] RCP, RRT, CHT Entered By: [name] on 03/20/2022 10:54:51

## 2022-03-20 NOTE — Progress Notes (Signed)
JANARI, YAMADA ([id number]) Visit Report for 03/20/2022 Arrival Information Details Patient Name: [name], [name]. Date of Service: 03/20/2022 9:00 AM Medical Record Number: [id number] Patient Account Number: [id number] Date of Birth/Sex: [birth year] (61 y.o. M) Treating RN: Carlene Coria Primary Care Darleen Moffitt: [name] Other Clinician: [name] Referring Andelyn Spade: [name] Treating Tyarra Nolton/Extender: Yaakov Guthrie in Treatment: 7 Visit Information History Since Last Visit Added or deleted any medications: No Patient Arrived: Knee Scooter Any new allergies or adverse reactions: No Arrival Time: 08:50 Had a fall or experienced change in No Accompanied By: self activities of daily living that may affect Transfer Assistance: None risk of falls: Patient Identification Verified: Yes Signs or symptoms of abuse/neglect since last visito No Secondary Verification Process Completed: Yes Hospitalized since last visit: No Patient Requires Transmission-Based Precautions: No Implantable device outside of the clinic excluding No Patient Has Alerts: No cellular tissue based products placed in the center since last visit: Has Dressing in Place as Prescribed: Yes Has Footwear/Offloading in Place as Prescribed: Yes Left: Removable Cast Walker/Walking Boot Pain Present Now: No Electronic Signature(s) Signed: 03/20/2022 12:43:16 PM By: [name] RCP, RRT, CHT Entered By: [name] on 03/20/2022 10:51:34 [name] ([id number]) -------------------------------------------------------------------------------- Encounter Discharge Information Details Patient Name: [name]. Date of Service: 03/20/2022 9:00 AM Medical Record Number: [id number] Patient Account Number: [id number] Date of Birth/Sex: February 06, 1961 (61 y.o. M) Treating RN: Carlene Coria Primary Care Kinda Pottle: [name] Other Clinician: [name] Referring  Nikolos Billig: [name] Treating Verdia Bolt/Extender: Yaakov Guthrie in Treatment: 7 Encounter Discharge Information Items Discharge Condition: Stable Ambulatory Status: Knee Scooter Discharge Destination: Home Transportation: Private Auto Accompanied By: self Schedule Follow-up Appointment: Yes Clinical Summary of Care: Notes Patient has an HBO treatment scheduled on 03/21/22 at 10:00. Electronic Signature(s) Signed: 03/20/2022 12:43:16 PM By: [name] RCP, RRT, CHT Entered By: [name] on 03/20/2022 11:36:13 [name] ([id number]) -------------------------------------------------------------------------------- Vitals Details Patient Name: [name], [name]. Date of Service: 03/20/2022 9:00 AM Medical Record Number: [id number] Patient Account Number: [id number] Date of Birth/Sex: June 19, 1961 (61 y.o. M) Treating RN: Carlene Coria Primary Care Dang Mathison: [name] Other Clinician: [name] Referring Zaylan Kissoon: [name] Treating Domnick Chervenak/Extender: Yaakov Guthrie in Treatment: 7 Vital Signs Time Taken: 08:54 Temperature (F): 97.9 Height (in): 70 Pulse (bpm): 72 Weight (lbs): 255 Respiratory Rate (breaths/min): 18 Body Mass Index (BMI): 36.6 Blood Pressure (mmHg): 122/62 Capillary Blood Glucose (mg/dl): 104 Reference Range: 80 - 120 mg / dl Electronic Signature(s) Signed: 03/20/2022 12:43:16 PM By: [name] RCP, RRT, CHT Entered By: [name] on 03/20/2022 10:52:22

## 2022-03-21 ENCOUNTER — Encounter: Payer: 59 | Admitting: Physician Assistant

## 2022-03-21 DIAGNOSIS — M86372 Chronic multifocal osteomyelitis, left ankle and foot: Secondary | ICD-10-CM | POA: Diagnosis not present

## 2022-03-21 LAB — GLUCOSE, CAPILLARY
Glucose-Capillary: 87 mg/dL (ref 70–99)
Glucose-Capillary: 174 mg/dL — ABNORMAL HIGH (ref 70–99)
Glucose-Capillary: 177 mg/dL — ABNORMAL HIGH (ref 70–99)

## 2022-03-21 NOTE — Progress Notes (Signed)
RANDON, SOMERA ([id number]) Visit Report for 03/21/2022 Arrival Information Details Patient Name: [name], [name]. Date of Service: 03/21/2022 10:00 AM Medical Record Number: [id number] Patient Account Number: [id number] Date of Birth/Sex: [birth year] (60 y.o. M) Treating RN: Carlene Coria Primary Care Semaya Vida: [name] Other Clinician: [name] Referring Jarek Longton: [name] Treating Airen Stiehl/Extender: Skipper Cliche in Treatment: 7 Visit Information History Since Last Visit Added or deleted any medications: No Patient Arrived: Knee Scooter Any new allergies or adverse reactions: No Arrival Time: 09:55 Had a fall or experienced change in No Accompanied By: self activities of daily living that may affect Transfer Assistance: None risk of falls: Patient Identification Verified: Yes Signs or symptoms of abuse/neglect since last visito No Secondary Verification Process Completed: Yes Hospitalized since last visit: No Patient Requires Transmission-Based Precautions: No Implantable device outside of the clinic excluding No Patient Has Alerts: No cellular tissue based products placed in the center since last visit: Has Dressing in Place as Prescribed: Yes Pain Present Now: No Electronic Signature(s) Signed: 03/21/2022 1:19:01 PM By: [name] RCP, RRT, CHT Entered By: [name] on 03/21/2022 10:47:57 [name] ([id number]) -------------------------------------------------------------------------------- Encounter Discharge Information Details Patient Name: [name]. Date of Service: 03/21/2022 10:00 AM Medical Record Number: [id number] Patient Account Number: [id number] Date of Birth/Sex: 1960-08-03 (61 y.o. M) Treating RN: Carlene Coria Primary Care Toriann Spadoni: [name] Other Clinician: [name] Referring Anjelita Sheahan: [name] Treating Merrillyn Ackerley/Extender: Skipper Cliche in Treatment: 7 Encounter  Discharge Information Items Discharge Condition: Stable Ambulatory Status: Knee Scooter Discharge Destination: Home Transportation: Private Auto Accompanied By: self Schedule Follow-up Appointment: Yes Clinical Summary of Care: Notes Patient has an HBO treatment scheduled on 03/22/22 at 07:30 am. Electronic Signature(s) Signed: 03/21/2022 1:19:01 PM By: [name] RCP, RRT, CHT Entered By: [name] on 03/21/2022 13:18:40 [name] ([id number]) -------------------------------------------------------------------------------- Vitals Details Patient Name: [name]. Date of Service: 03/21/2022 10:00 AM Medical Record Number: [id number] Patient Account Number: [id number] Date of Birth/Sex: December 03, 1960 (61 y.o. M) Treating RN: Carlene Coria Primary Care Gazelle Towe: [name] Other Clinician: [name] Referring Jacori Mulrooney: [name] Treating Florencio Hollibaugh/Extender: Skipper Cliche in Treatment: 7 Vital Signs Time Taken: 10:01 Temperature (F): 97.7 Height (in): 70 Pulse (bpm): 84 Weight (lbs): 255 Respiratory Rate (breaths/min): 18 Body Mass Index (BMI): 36.6 Blood Pressure (mmHg): 118/62 Capillary Blood Glucose (mg/dl): 87 Reference Range: 80 - 120 mg / dl Electronic Signature(s) Signed: 03/21/2022 1:19:01 PM By: [name] RCP, RRT, CHT Entered By: [name] on 03/21/2022 10:49:06

## 2022-03-22 ENCOUNTER — Encounter: Payer: 59 | Admitting: Physician Assistant

## 2022-03-22 DIAGNOSIS — M86372 Chronic multifocal osteomyelitis, left ankle and foot: Secondary | ICD-10-CM | POA: Diagnosis not present

## 2022-03-22 LAB — GLUCOSE, CAPILLARY
Glucose-Capillary: 182 mg/dL — ABNORMAL HIGH (ref 70–99)
Glucose-Capillary: 127 mg/dL — ABNORMAL HIGH (ref 70–99)

## 2022-03-22 NOTE — Progress Notes (Signed)
[name], [name] ([id number]) Visit Report for 03/22/2022 Arrival Information Details Patient Name: [name], [name]. Date of Service: 03/22/2022 8:00 AM Medical Record Number: [id number] Patient Account Number: [id number] Date of Birth/Sex: 1960-12-13 (61 y.o. M) Treating RN: Cornell Barman Primary Care Amory Zbikowski: [name] Other Clinician: [name] Referring Juanita Streight: [name] Treating Sayeed Weatherall/Extender: Skipper Cliche in Treatment: 7 Visit Information History Since Last Visit Added or deleted any medications: No Patient Arrived: Knee Scooter Any new allergies or adverse reactions: No Arrival Time: 07:42 Had a fall or experienced change in No Accompanied By: self activities of daily living that may affect Transfer Assistance: None risk of falls: Patient Identification Verified: Yes Signs or symptoms of abuse/neglect since last visito No Secondary Verification Process Completed: Yes Hospitalized since last visit: No Patient Requires Transmission-Based Precautions: No Implantable device outside of the clinic excluding No Patient Has Alerts: No cellular tissue based products placed in the center since last visit: Has Dressing in Place as Prescribed: Yes Has Footwear/Offloading in Place as Prescribed: Yes Pain Present Now: Yes Electronic Signature(s) Signed: 03/22/2022 12:11:54 PM By: [name] RCP, RRT, CHT Entered By: [name] on 03/22/2022 08:07:25 [name] ([id number]) -------------------------------------------------------------------------------- Encounter Discharge Information Details Patient Name: [name], [name]. Date of Service: 03/22/2022 8:00 AM Medical Record Number: [id number] Patient Account Number: [id number] Date of Birth/Sex: [birth year] (61 y.o. M) Treating RN: Cornell Barman Primary Care Angelmarie Ponzo: [name] Other Clinician: [name] Referring Cheron Pasquarelli: [name] Treating Xzandria Clevinger/Extender:  Skipper Cliche in Treatment: 7 Encounter Discharge Information Items Discharge Condition: Stable Ambulatory Status: Knee Scooter Discharge Destination: Home Transportation: Private Auto Accompanied By: self Schedule Follow-up Appointment: Yes Clinical Summary of Care: Notes Patient has an HBO treatment scheduled on 03/25/22 at 8:00 am. Electronic Signature(s) Signed: 03/22/2022 12:11:54 PM By: [name] RCP, RRT, CHT Entered By: [name] on 03/22/2022 11:11:50 [name] ([id number]) -------------------------------------------------------------------------------- Vitals Details Patient Name: [name]. Date of Service: 03/22/2022 8:00 AM Medical Record Number: [id number] Patient Account Number: [id number] Date of Birth/Sex: 1961/06/29 (61 y.o. M) Treating RN: Cornell Barman Primary Care Dalon Reichart: [name] Other Clinician: [name] Referring Aris Moman: [name] Treating Winda Summerall/Extender: Skipper Cliche in Treatment: 7 Vital Signs Time Taken: 07:43 Temperature (F): 97.7 Height (in): 70 Pulse (bpm): 78 Weight (lbs): 255 Respiratory Rate (breaths/min): 16 Body Mass Index (BMI): 36.6 Blood Pressure (mmHg): 130/62 Capillary Blood Glucose (mg/dl): 182 Reference Range: 80 - 120 mg / dl Electronic Signature(s) Signed: 03/22/2022 12:11:54 PM By: [name] RCP, RRT, CHT Entered By: [name] on 03/22/2022 08:09:36

## 2022-03-22 NOTE — Progress Notes (Signed)
BIRL, LOBELLO ([id number]) Visit Report for 03/19/2022 Arrival Information Details Patient Name: [name], [name]. Date of Service: 03/19/2022 9:00 AM Medical Record Number: [id number] Patient Account Number: [id number] Date of Birth/Sex: [birth year] (61 y.o. M) Treating RN: Carlene Coria Primary Care Allisa Einspahr: [name] Other Clinician: Massie Kluver Referring Sherry Blackard: [name] Treating Adrienne Delay/Extender: Skipper Cliche in Treatment: 7 Visit Information History Since Last Visit All ordered tests and consults were completed: No Patient Arrived: Knee Scooter Added or deleted any medications: No Arrival Time: 09:01 Any new allergies or adverse reactions: No Transfer Assistance: None Had a fall or experienced change in No Patient Requires Transmission-Based Precautions: No activities of daily living that may affect Patient Has Alerts: No risk of falls: Hospitalized since last visit: No Pain Present Now: Yes Electronic Signature(s) Signed: 03/21/2022 11:41:41 AM By: Massie Kluver Entered By: Massie Kluver on 03/19/2022 09:06:02 [name] ([id number]) -------------------------------------------------------------------------------- Clinic Level of Care Assessment Details Patient Name: [name]. Date of Service: 03/19/2022 9:00 AM Medical Record Number: [id number] Patient Account Number: [id number] Date of Birth/Sex: 27-Jun-1961 (61 y.o. M) Treating RN: Carlene Coria Primary Care Omare Bilotta: [name] Other Clinician: Massie Kluver Referring Keryl Gholson: [name] Treating Davian Hanshaw/Extender: Skipper Cliche in Treatment: 7 Clinic Level of Care Assessment Items TOOL 4 Quantity Score [] - Use when only an EandM is performed on FOLLOW-UP visit 0 ASSESSMENTS - Nursing Assessment / Reassessment X - Reassessment of Co-morbidities (includes updates in patient status) 1 10 X- 1 5 Reassessment of Adherence to Treatment Plan ASSESSMENTS - Wound and Skin  Assessment / Reassessment X - Simple Wound Assessment / Reassessment - one wound 1 5 [] - 0 Complex Wound Assessment / Reassessment - multiple wounds [] - 0 Dermatologic / Skin Assessment (not related to wound area) ASSESSMENTS - Focused Assessment [] - Circumferential Edema Measurements - multi extremities 0 [] - 0 Nutritional Assessment / Counseling / Intervention [] - 0 Lower Extremity Assessment (monofilament, tuning fork, pulses) [] - 0 Peripheral Arterial Disease Assessment (using hand held doppler) ASSESSMENTS - Ostomy and/or Continence Assessment and Care [] - Incontinence Assessment and Management 0 [] - 0 Ostomy Care Assessment and Management (repouching, etc.) PROCESS - Coordination of Care X - Simple Patient / Family Education for ongoing care 1 15 [] - 0 Complex (extensive) Patient / Family Education for ongoing care [] - 0 Staff obtains Programmer, systems, Records, Test Results / Process Orders [] - 0 Staff telephones HHA, Nursing Homes / Clarify orders / etc [] - 0 Routine Transfer to another Facility (non-emergent condition) [] - 0 Routine Hospital Admission (non-emergent condition) [] - 0 New Admissions / Biomedical engineer / Ordering NPWT, Apligraf, etc. [] - 0 Emergency Hospital Admission (emergent condition) X- 1 10 Simple Discharge Coordination [] - 0 Complex (extensive) Discharge Coordination PROCESS - Special Needs [] - Pediatric / Minor Patient Management 0 [] - 0 Isolation Patient Management [] - 0 Hearing / Language / Visual special needs [] - 0 Assessment of Community assistance (transportation, D/C planning, etc.) [] - 0 Additional assistance / Altered mentation [] - 0 Support Surface(s) Assessment (bed, cushion, seat, etc.) INTERVENTIONS - Wound Cleansing / Measurement TYREE, FLUHARTY. ([id number]) X- 1 5 Simple Wound Cleansing - one wound [] - 0 Complex Wound Cleansing - multiple wounds X- 1 5 Wound Imaging (photographs - any number of  wounds) [] - 0 Wound Tracing (instead of photographs) X- 1 5 Simple Wound Measurement - one wound [] - 0 Complex Wound  Measurement - multiple wounds INTERVENTIONS - Wound Dressings [] - Small Wound Dressing one or multiple wounds 0 X- 1 15 Medium Wound Dressing one or multiple wounds [] - 0 Large Wound Dressing one or multiple wounds [] - 0 Application of Medications - topical [] - 0 Application of Medications - injection INTERVENTIONS - Miscellaneous [] - External ear exam 0 [] - 0 Specimen Collection (cultures, biopsies, blood, body fluids, etc.) [] - 0 Specimen(s) / Culture(s) sent or taken to Lab for analysis [] - 0 Patient Transfer (multiple staff / Civil Service fast streamer / Similar devices) [] - 0 Simple Staple / Suture removal (25 or less) [] - 0 Complex Staple / Suture removal (26 or more) [] - 0 Hypo / Hyperglycemic Management (close monitor of Blood Glucose) [] - 0 Ankle / Brachial Index (ABI) - do not check if billed separately X- 1 5 Vital Signs Has the patient been seen at the hospital within the last three years: Yes Total Score: 80 Level Of Care: New/Established - Level 3 Electronic Signature(s) Signed: 03/21/2022 11:41:41 AM By: Massie Kluver Entered By: Massie Kluver on 03/19/2022 09:34:50 [name] ([id number]) -------------------------------------------------------------------------------- Encounter Discharge Information Details Patient Name: [name]. Date of Service: 03/19/2022 9:00 AM Medical Record Number: [id number] Patient Account Number: [id number] Date of Birth/Sex: Nov 13, 1960 (61 y.o. M) Treating RN: Carlene Coria Primary Care Kjerstin Abrigo: [name] Other Clinician: Massie Kluver Referring Brian Kocourek: [name] Treating Kyzer Blowe/Extender: Skipper Cliche in Treatment: 7 Encounter Discharge Information Items Discharge Condition: Stable Ambulatory Status: Knee Scooter Discharge Destination: Home Transportation: Private  Auto Accompanied By: self Schedule Follow-up Appointment: Yes Clinical Summary of Care: Electronic Signature(s) Signed: 03/21/2022 11:41:41 AM By: Massie Kluver Entered By: Massie Kluver on 03/19/2022 09:43:54 [name] ([id number]) -------------------------------------------------------------------------------- Lower Extremity Assessment Details Patient Name: ELIJAHJAMES, FUELLING. Date of Service: 03/19/2022 9:00 AM Medical Record Number: [id number] Patient Account Number: [id number] Date of Birth/Sex: 01/30/1961 (61 y.o. M) Treating RN: Carlene Coria Primary Care Alethia Melendrez: [name] Other Clinician: Massie Kluver Referring Tamyia Minich: [name] Treating Huberta Tompkins/Extender: Skipper Cliche in Treatment: 7 Electronic Signature(s) Signed: 03/21/2022 11:41:41 AM By: Massie Kluver Signed: 03/22/2022 9:44:45 AM By: Carlene Coria RN Entered By: Massie Kluver on 03/19/2022 09:14:56 [name] ([id number]) -------------------------------------------------------------------------------- Multi Wound Chart Details Patient Name: OBEDIAH, WELLES. Date of Service: 03/19/2022 9:00 AM Medical Record Number: [id number] Patient Account Number: [id number] Date of Birth/Sex: July 20, 1960 (61 y.o. 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/A Extremity Secondary Etiology: Dehisced Wound N/A N/A Comorbid History: Hypertension, Type II Diabetes, N/A N/A Neuropathy Date Acquired: 10/29/2021 N/A N/A Weeks of  Treatment: 7 N/A N/A Wound Status: Open N/A N/A Wound Recurrence: No N/A N/A Pending Amputation on Yes N/A N/A Presentation: Measurements L x W x D (cm) 4.9x4.1x1.3 N/A N/A Area (cm) : 15.779 N/A N/A Volume (cm) : 20.512 N/A N/A % Reduction in Area: -46.10% N/A N/A % Reduction in Volume: 24.00% N/A N/A Classification: Grade 3 N/A N/A Exudate Amount: Medium N/A N/A Exudate Type: Sanguinous N/A N/A Exudate Color: red N/A N/A Wound Margin: Epibole N/A N/A Granulation Amount: Medium (34-66%) N/A N/A Granulation Quality:  Red, Pink N/A N/A Necrotic Amount: Medium (34-66%) N/A N/A Exposed Structures: Fat Layer (Subcutaneous Tissue): N/A N/A Yes Muscle: Yes Bone: Yes Fascia: No Tendon: No Joint: No Epithelialization: None N/A N/A Treatment Notes Electronic Signature(s) Signed: 03/21/2022 11:41:41 AM By: Willa Frater ([id number]) Entered By: Massie Kluver on 03/19/2022 09:15:14 [name] ([id number]) -------------------------------------------------------------------------------- [place] Details Patient Name: VISHWA, DAIS. Date of Service: 03/19/2022 9:00 AM Medical Record Number: [id number] Patient Account Number: [id number] Date of Birth/Sex: October 14, 1960 (61 y.o. M) Treating RN: Carlene Coria Primary Care Provider: [name] Other Clinician: Massie Kluver Referring Provider: [name] Treating Provider/Extender: Skipper Cliche in Treatment: 7 Active Inactive HBO Nursing Diagnoses: Anxiety related to feelings of confinement associated with the hyperbaric oxygen chamber Anxiety related to knowledge deficit of hyperbaric oxygen therapy and treatment procedures Discomfort related to temperature and humidity changes inside hyperbaric chamber Potential for barotraumas to ears, sinuses, teeth, and lungs or cerebral gas embolism related to changes in atmospheric pressure inside hyperbaric oxygen chamber Potential for  oxygen toxicity seizures related to delivery of 100% oxygen at an increased atmospheric pressure Potential for pulmonary oxygen toxicity related to delivery of 100% oxygen at an increased atmospheric pressure Goals: Barotrauma will be prevented during HBO2 Date Initiated: 02/11/2022 Target Resolution Date: 02/11/2022 Goal Status: Active Patient and/or family will be able to state/discuss factors appropriate to the management of their disease process during treatment Date Initiated: 02/11/2022 Target Resolution Date: 02/11/2022 Goal Status: Active Patient will tolerate the hyperbaric oxygen therapy treatment Date Initiated: 02/11/2022 Target Resolution Date: 02/11/2022 Goal Status: Active Patient will tolerate the internal climate of the chamber Date Initiated: 02/11/2022 Target Resolution Date: 02/12/2022 Goal Status: Active Patient/caregiver will verbalize understanding of HBO goals, rationale, procedures and potential hazards Date Initiated: 02/11/2022 Target Resolution Date: 02/11/2022 Goal Status: Active Signs and symptoms of pulmonary oxygen toxicity will be recognized and promptly addressed Date Initiated: 02/11/2022 Target Resolution Date: 02/11/2022 Goal Status: Active Signs and symptoms of seizure will be recognized and promptly addressed ; seizing patients will suffer no harm Date Initiated: 02/11/2022 Target Resolution Date: 02/11/2022 Goal Status: Active Interventions: Administer a five (5) minute air break for patient if signs and symptoms of seizure appear and notify the hyperbaric physician Administer decongestants, per physician orders, prior to HBO2 Administer the correct therapeutic gas delivery based on the patients needs and limitations, per physician order Assess and provide for patientos comfort related to the hyperbaric environment and equalization of middle ear Assess for signs and symptoms related to adverse events, including but not limited to confinement anxiety,  pneumothorax, oxygen toxicity and baurotrauma Assess patient for any history of confinement anxiety Assess patient's knowledge and expectations regarding hyperbaric medicine and provide education related to the hyperbaric environment, goals of treatment and prevention of adverse events Implement protocols to decrease risk of pneumothorax in high risk patients Notes: Necrotic Tissue Nursing Diagnoses: Impaired tissue integrity related to necrotic/devitalized tissue ANDRAS, GRUNEWALD ([id number]) Knowledge deficit related to management of necrotic/devitalized tissue Goals: Necrotic/devitalized tissue will be minimized in the wound bed Date Initiated: 02/11/2022 Target Resolution Date: 02/12/2022 Goal Status: Active Patient/caregiver will verbalize understanding of reason and process for debridement of necrotic tissue Date Initiated: 02/11/2022 Target Resolution Date: 02/11/2022 Goal Status: Active Interventions: Assess patient pain level pre-, during and post procedure and prior to discharge Provide education on necrotic tissue and debridement process Treatment Activities: Apply topical anesthetic as ordered : 02/11/2022 Enzymatic debridement : 02/11/2022 Excisional debridement : 02/11/2022 Notes:  Wound/Skin Impairment Nursing Diagnoses: Knowledge deficit related to ulceration/compromised skin integrity Goals: Patient/caregiver will verbalize understanding of skin care regimen Date Initiated: 01/29/2022 Target Resolution Date: 03/01/2022 Goal Status: Active Ulcer/skin breakdown will have a volume reduction of 30% by week 4 Date Initiated: 01/29/2022 Target Resolution Date: 03/01/2022 Goal Status: Active Ulcer/skin breakdown will have a volume reduction of 50% by week 8 Date Initiated: 01/29/2022 Target Resolution Date: 03/31/2022 Goal Status: Active Ulcer/skin breakdown will have a volume reduction of 80% by week 12 Date Initiated: 01/29/2022 Target Resolution Date: 05/01/2022 Goal Status:  Active Ulcer/skin breakdown will heal within 14 weeks Date Initiated: 01/29/2022 Target Resolution Date: 05/01/2022 Goal Status: Active Interventions: Assess patient/caregiver ability to obtain necessary supplies Assess patient/caregiver ability to perform ulcer/skin care regimen upon admission and as needed Assess ulceration(s) every visit Notes: Electronic Signature(s) Signed: 03/21/2022 11:41:41 AM By: Massie Kluver Signed: 03/22/2022 9:44:45 AM By: Carlene Coria RN Entered By: Massie Kluver on 03/19/2022 09:15:00 [name] ([id number]) -------------------------------------------------------------------------------- Pain Assessment Details Patient Name: [name]. Date of Service: 03/19/2022 9:00 AM Medical Record Number: [id number] Patient Account Number: [id number] Date of Birth/Sex: Sep 05, 1960 (61 y.o. M) Treating RN: Carlene Coria Primary Care Clemence Stillings: [name] Other Clinician: Massie Kluver Referring Wreatha Sturgeon: [name] Treating Bocephus Cali/Extender: Skipper Cliche in Treatment: 7 Active Problems Location of Pain Severity and Description of Pain Patient Has Paino Yes Site Locations Pain Location: Pain in Ulcers Duration of the Pain. Constant / Intermittento Constant Rate the pain. Current Pain Level: 6 Character of Pain Describe the Pain: Burning Pain Management and Medication Current Pain Management: Medication: Yes Rest: Yes Electronic Signature(s) Signed: 03/21/2022 11:41:41 AM By: Massie Kluver Signed: 03/22/2022 9:44:45 AM By: Carlene Coria RN Entered By: Massie Kluver on 03/19/2022 09:08:44 [name] ([id number]) -------------------------------------------------------------------------------- Patient/Caregiver Education Details Patient Name: FAROUK, VIVERO. Date of Service: 03/19/2022 9:00 AM Medical Record Number: [id number] Patient Account Number: [id number] Date of Birth/Gender: 1961/04/17 (61 y.o. M) Treating RN: Carlene Coria Primary Care Physician: [name] Other Clinician: Massie Kluver Referring Physician: [name] Treating Physician/Extender: Skipper Cliche in Treatment: 7 Education Assessment Education Provided To: Patient Education Topics Provided Wound/Skin Impairment: Handouts: Other: continue wound care as directed Methods: Explain/Verbal Responses: State content correctly Electronic Signature(s) Signed: 03/21/2022 11:41:41 AM By: Massie Kluver Entered By: Massie Kluver on 03/19/2022 09:35:14 [name] ([id number]) -------------------------------------------------------------------------------- Wound Assessment Details Patient Name: [name]. Date of Service: 03/19/2022 9:00 AM Medical Record Number: [id number] Patient Account Number: [id number] Date of Birth/Sex: [birth year] (61 y.o. M) Treating RN: Carlene Coria Primary Care Rayetta Veith: [name] Other Clinician: Massie Kluver Referring Jasalyn Frysinger: [name] Treating Jernie Schutt/Extender: Skipper Cliche in Treatment: 7 Wound Status Wound Number: 14 Primary Etiology: Diabetic Wound/Ulcer of the Lower Extremity Wound Location: Left, Lateral Foot Secondary Etiology: Dehisced Wound Wounding Event: Blister Wound Status: Open Date Acquired: 10/29/2021 Comorbid History: Hypertension, Type II Diabetes, Neuropathy Weeks Of Treatment: 7 Clustered Wound: No Pending Amputation On Presentation Photos Wound Measurements Length: (cm) 4.9 % Red Width: (cm) 4.1 % Red Depth: (cm) 1.3 Epith Area: (cm) 15.779 Volume: (cm) 20.512 uction in Area: -46.1% uction in Volume: 24% elialization: None Wound Description Classification: Grade 3 Foul Wound Margin: Epibole Sloug Exudate Amount: Medium Exudate Type: Sanguinous Exudate Color: red Odor After Cleansing: No h/Fibrino Yes Wound Bed Granulation Amount: Medium (34-66%) Exposed Structure Granulation Quality: Red, Pink Fascia Exposed: No Necrotic  Amount: Medium (34-66%) Fat Layer (Subcutaneous Tissue) Exposed: Yes Necrotic Quality: Adherent Slough Tendon Exposed: No  Muscle Exposed: Yes Necrosis of Muscle: No Joint Exposed: No Bone Exposed: Yes Treatment Notes Wound #14 (Foot) Wound Laterality: Left, Lateral Cleanser [place] [facility] - [address] ZYAIR, RUSSI ([id number]) Discharge Instruction: Use supplies as instructed; Kit contains: (15) Saline Bullets; (15) 3x3 Gauze; 15 pr Gloves Soap and Water Discharge Instruction: Gently cleanse wound with antibacterial soap, rinse and pat dry prior to dressing wounds Wound Cleanser Discharge Instruction: Wash your hands with soap and water. Remove old dressing, discard into plastic bag and place into trash. Cleanse the wound with Wound Cleanser prior to applying a clean dressing using gauze sponges, not tissues or cotton balls. Do not scrub or use excessive force. Pat dry using gauze sponges, not tissue or cotton balls. Peri-Wound Care Topical Primary Dressing Hydrofera Blue Ready Transfer Foam, 4x5 (in/in) Discharge Instruction: Apply Hydrofera Blue Ready to wound bed as directed Secondary Dressing ABD Pad 5x9 (in/in) Discharge Instruction: Cover with ABD pad Secured With [place] H Soft Cloth Surgical Tape, 2x2 (in/yd) Kerlix Roll Sterile or Non-Sterile 6-ply 4.5x4 (yd/yd) Discharge Instruction: Apply Kerlix as directed Compression Wrap Compression Stockings Add-Ons Electronic Signature(s) Signed: 03/21/2022 11:41:41 AM By: Massie Kluver Signed: 03/22/2022 9:44:45 AM By: Carlene Coria RN Entered By: Massie Kluver on 03/19/2022 09:14:40 [name] ([id number]) -------------------------------------------------------------------------------- Vitals Details Patient Name: [name]. Date of Service: 03/19/2022 9:00 AM Medical Record Number: [id number] Patient Account Number: [id number] Date of Birth/Sex: 10-Feb-1961 (61 y.o. M) Treating RN:  Carlene Coria Primary Care Provider: [name] Other Clinician: Massie Kluver Referring Provider: [name] Treating Provider/Extender: Skipper Cliche in Treatment: 7 Vital Signs Time Taken: 09:06 Temperature (F): 97.8 Height (in): 70 Pulse (bpm): 105 Weight (lbs): 255 Respiratory Rate (breaths/min): 18 Body Mass Index (BMI): 36.6 Blood Pressure (mmHg): 153/78 Reference Range: 80 - 120 mg / dl Electronic Signature(s) Signed: 03/21/2022 11:41:41 AM By: Massie Kluver Entered By: Massie Kluver on 03/19/2022 75:17:00

## 2022-03-22 NOTE — Progress Notes (Signed)
MARKES, SHATSWELL ([id number]) Visit Report for 03/21/2022 HBO Details Patient Name: [name], [name]. Date of Service: 03/21/2022 10:00 AM Medical Record Number: [id number] Patient Account Number: [id number] Date of Birth/Sex: Nov 11, 1960 (61 y.o. 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 HBO Treatment Details Treatment Number: 21 Patient Type: Outpatient Chamber Type: Monoplace Chamber Serial #: [id number] Treatment Protocol: 2.0 ATA with 90 minutes oxygen, and no air breaks Treatment Details Compression Rate Down: 1.5 psi / minute De-Compression Rate Up: 1.5 psi / minute Compress Tx Pressure Air breaks and breathing periods Decompress Decompress Begins Reached (leave unused spaces blank) Begins Ends Chamber Pressure (ATA) 1 2 - - - - - - 2 1 Clock Time (24 hr) 10:39 10:49 - - - - - - 12:20 12:30 Treatment Length: 111 (minutes) Treatment Segments: 4 Vital Signs Capillary Blood Glucose Reference Range: 80 - 120 mg / dl HBO Diabetic Blood Glucose Intervention Range: <131 mg/dl or >249 mg/dl Time Capillary Blood Glucose Pulse Blood Respiratory Action Type: Vitals Pulse: Temperature: Glucose Meter Oximetry Pressure: Rate: Taken: Taken: (mg/dl): #: (%) Gave 2 Ensure per [name], Pre 10:01 118/62 84 18 97.7 87 1 III, PA- none per protocol; proceed with Pre 10:37 174 HBO Post 12:43 128/68 72 16 98.3 177 1 none per protocol Treatment Response Treatment Toleration: Well Treatment Completion Treatment Completed without Adverse Event Status: Electronic Signature(s) Signed: 03/21/2022 1:19:01 PM By:  [name] RCP, RRT, CHT Signed: 03/22/2022 1:36:52 PM By: Worthy Keeler PA-C Entered By: Stark Jock, Amado Nash on 03/21/2022 13:17:32 [name] ([id number]) -------------------------------------------------------------------------------- HBO Safety Checklist Details Patient Name: [name]. Date of Service: 03/21/2022 10:00 AM Medical Record Number: [id number] Patient Account Number: [id number] Date of Birth/Sex: February 01, 1961 (61 y.o. M) Treating RN: Carlene Coria Primary Care Lillyan Hitson: [name] Other Clinician: [name] Referring Eloise Mula: [name] Treating Shamyia Grandpre/Extender: Skipper Cliche in Treatment: 7 HBO Safety Checklist Items Safety Checklist Consent Form Signed Patient voided / foley secured and emptied When did you last eato 09:00 am Last dose of injectable or oral agent 03/20/22 pm Ostomy pouch emptied and vented if applicable NA All implantable devices assessed, documented and approved NA Intravenous access site secured and place NA Valuables secured Linens and cotton and cotton/polyester blend (less than 51% polyester) Personal oil-based products / skin lotions / body lotions removed Wigs or hairpieces removed NA Smoking or tobacco materials removed NA Books / newspapers / magazines / loose paper removed NA Cologne, aftershave, perfume and deodorant removed Jewelry removed (may wrap wedding band) Make-up removed NA Hair care products removed Battery operated devices (external) removed NA Heating patches and chemical warmers removed NA Titanium eyewear removed NA Nail polish cured greater than 10 hours NA Casting material cured greater than 10 hours NA Hearing aids removed NA Loose dentures or partials removed NA Prosthetics have been removed NA Patient demonstrates correct use of air break device (if applicable) Patient concerns have been addressed Patient grounding bracelet on and cord attached to  chamber Specifics for Inpatients (complete in addition to above) Medication sheet sent with patient Intravenous medications needed or due during therapy sent with patient Drainage tubes (e.g. nasogastric tube or chest tube secured and vented) Endotracheal or  Tracheotomy tube secured Cuff deflated of air and inflated with saline Airway suctioned Electronic Signature(s) Signed: 03/21/2022 1:19:01 PM By: [name] RCP, RRT, CHT Entered By: [name] on 03/21/2022 10:51:01

## 2022-03-22 NOTE — Progress Notes (Signed)
HIEN, PERREIRA ([id number]) Visit Report for 03/22/2022 HBO Details Patient Name: [name], [name]. Date of Service: 03/22/2022 8:00 AM Medical Record Number: [id number] Patient Account Number: [id number] Date of Birth/Sex: 11/10/1960 (61 y.o. 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 and Foot HBO Treatment Details Treatment Number: 22 Patient Type: Outpatient Chamber Type: Monoplace Chamber Serial #: [id number] Treatment Protocol: 2.0 ATA with 90 minutes oxygen, and no air breaks Treatment Details Compression Rate Down: 1.5 psi / minute De-Compression Rate Up: 1.5 psi / minute Compress Tx Pressure Air breaks and breathing periods Decompress Decompress Begins Reached (leave unused spaces blank) Begins Ends Chamber Pressure (ATA) 1 2 - - - - - - 2 1 Clock Time (24 hr) 07:50 08:02 - - - - - - 09:33 09:43 Treatment Length: 113 (minutes) Treatment Segments: 4 Vital Signs Capillary Blood Glucose Reference Range: 80 - 120 mg / dl HBO Diabetic Blood Glucose Intervention Range: <131 mg/dl or >249 mg/dl Time Vitals Blood Respiratory Capillary Blood Glucose Pulse Action Type: Pulse: Temperature: Taken: Pressure: Rate: Glucose (mg/dl): Meter #: Oximetry (%) Taken: Pre 07:43 130/62 78 16 97.7 182 1 none per protocol Post 10:01 128/70 60 16 97.9 127 1 none per protocol Treatment Response Treatment Toleration: Well Treatment Completion Treatment Completed without Adverse Event Status: Electronic Signature(s) Signed: 03/22/2022 12:11:54 PM By: Zackery Barefoot, RRT, CHT Signed: 03/22/2022 1:36:52 PM By: Worthy Keeler PA-C Entered By: [name] on 03/22/2022 11:10:09 [name] ([id number]) -------------------------------------------------------------------------------- HBO Safety Checklist Details Patient Name: [name]. Date of Service: 03/22/2022 8:00 AM Medical Record Number: [id number] Patient Account Number: [id number] Date of Birth/Sex: 07-25-1960 (61 y.o. M) Treating RN: Cornell Barman Primary Care Doni Widmer: [name] Other Clinician: [name] Referring Sanuel Ladnier: [name] Treating Makaya Juneau/Extender: Skipper Cliche in Treatment: 7 HBO Safety Checklist Items Safety Checklist Consent Form Signed Patient voided / foley secured and emptied When did you last eato 07:00 am Last dose of injectable or oral agent 03/21/22 pm Ostomy pouch emptied and vented if applicable NA All implantable devices assessed, documented and approved NA Intravenous access site secured and place NA Valuables secured Linens and cotton and cotton/polyester blend (less than 51% polyester) Personal oil-based products / skin lotions / body lotions removed Wigs or hairpieces removed NA Smoking or tobacco materials removed NA Books / newspapers / magazines / loose paper removed NA Cologne, aftershave, perfume and deodorant removed Jewelry removed (may wrap wedding band) Make-up removed NA Hair care products removed Battery operated devices (external) removed NA Heating patches and chemical warmers removed NA Titanium eyewear removed NA Nail polish cured greater than 10 hours NA Casting material cured greater than 10 hours NA Hearing aids removed NA Loose dentures or partials removed NA Prosthetics have been removed NA Patient demonstrates correct use of air break device (if applicable) Patient concerns have been addressed Patient grounding bracelet on and cord attached to chamber Specifics for Inpatients (complete in addition to above) Medication  sheet sent with patient Intravenous medications needed or due during therapy sent with patient Drainage tubes (e.g. nasogastric tube or chest tube secured and vented) Endotracheal or Tracheotomy tube secured Cuff deflated of air and inflated with saline Airway suctioned Electronic  Signature(s) Signed: 03/22/2022 12:11:54 PM By: [name] RCP, RRT, CHT Entered By: [name] on 03/22/2022 08:11:23

## 2022-03-25 ENCOUNTER — Encounter: Payer: 59 | Admitting: Physician Assistant

## 2022-03-25 DIAGNOSIS — M86372 Chronic multifocal osteomyelitis, left ankle and foot: Secondary | ICD-10-CM | POA: Diagnosis not present

## 2022-03-25 LAB — GLUCOSE, CAPILLARY
Glucose-Capillary: 158 mg/dL — ABNORMAL HIGH (ref 70–99)
Glucose-Capillary: 177 mg/dL — ABNORMAL HIGH (ref 70–99)

## 2022-03-25 NOTE — Progress Notes (Signed)
LEYTON, MAGOON ([id number]) Visit Report for 03/25/2022 HBO Details Patient Name: [name], [name]. Date of Service: 03/25/2022 8:00 AM Medical Record Number: [id number] Patient Account Number: [id number] Date of Birth/Sex: 1960/11/04 (61 y.o. 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 and Foot HBO Treatment Details Treatment Number: 23 Patient Type: Outpatient Chamber Type: Monoplace Chamber Serial #: [id number] Treatment Protocol: 2.0 ATA with 90 minutes oxygen, and no air breaks Treatment Details Compression Rate Down: 1.5 psi / minute De-Compression Rate Up: 1.5 psi / minute Compress Tx Pressure Air breaks and breathing periods Decompress Decompress Begins Reached (leave unused spaces blank) Begins Ends Chamber Pressure (ATA) 1 2 - - - - - - 2 1 Clock Time (24 hr) 08:21 08:33 - - - - - - 10:03 10:14 Treatment Length: 113 (minutes) Treatment Segments: 4 Vital Signs Capillary Blood Glucose Reference Range: 80 - 120 mg / dl HBO Diabetic Blood Glucose Intervention Range: <131 mg/dl or >249 mg/dl Time Vitals Blood Respiratory Capillary Blood Glucose Pulse Action Type: Pulse: Temperature: Taken: Pressure: Rate: Glucose (mg/dl): Meter #: Oximetry (%) Taken: Pre 08:05 122/66 90 16 97.8 158 1 none per protocol Post 10:47 122/67 72 16 98.4 177 1 none per protocol Treatment Response Treatment Toleration: Well Treatment Completion Treatment Completed without Adverse Event Status: HBO Attestation I certify that I supervised this HBO treatment in accordance with Medicare guidelines. A trained emergency response team  is readily Yes available per hospital policies and procedures. Continue HBOT as ordered. Yes Electronic Signature(s) Signed: 03/25/2022 12:16:24 PM By: Worthy Keeler PA-C Previous Signature: 03/25/2022 11:34:22 AM Version By: [name] RCP, RRT, CHT Previous Signature: 03/25/2022 12:16:04 PM Version By: Worthy Keeler PA-C Entered By: Worthy Keeler on 03/25/2022 12:16:24 [name] ([id number]) -------------------------------------------------------------------------------- HBO Safety Checklist Details Patient Name: SHANTANU, STRAUCH. Date of Service: 03/25/2022 8:00 AM Medical Record Number: [id number] Patient Account Number: [id number] Date of Birth/Sex: 1960/10/07 (61 y.o. M) Treating RN: Carlene Coria Primary Care Hasnain Manheim: [name] Other Clinician: [name] Referring Truxton Stupka: [name] Treating Javone Ybanez/Extender: Skipper Cliche in Treatment: 7 HBO Safety Checklist Items Safety Checklist Consent Form Signed Patient voided / foley secured and emptied When did you last eato 07:00 am Last dose of injectable or oral agent 07:00 am Ostomy pouch emptied and vented if applicable NA All implantable devices assessed, documented and approved NA Intravenous access site secured and place NA Valuables secured Linens and cotton and cotton/polyester blend (less than 51% polyester) Personal oil-based products / skin lotions / body lotions removed Wigs or hairpieces removed NA Smoking or tobacco materials removed NA Books / newspapers / magazines / loose paper removed NA Cologne, aftershave, perfume and deodorant removed Jewelry removed (may wrap wedding band) Make-up removed NA Hair care products removed Battery operated devices (external) removed NA Heating patches and chemical warmers removed NA Titanium eyewear removed NA Nail polish cured greater than 10 hours NA Casting material cured greater than 10 hours NA Hearing aids  removed NA Loose dentures or partials removed NA Prosthetics have been removed NA Patient demonstrates correct use of air break device (if applicable) Patient concerns have been addressed Patient grounding bracelet on and cord attached to chamber Specifics for Inpatients (  complete in addition to above) Medication sheet sent with patient Intravenous medications needed or due during therapy sent with patient Drainage tubes (e.g. nasogastric tube or chest tube secured and vented) Endotracheal or Tracheotomy tube secured Cuff deflated of air and inflated with saline Airway suctioned Electronic Signature(s) Signed: 03/25/2022 11:34:22 AM By: [name] RCP, RRT, CHT Entered By: [name] on 03/25/2022 09:02:06

## 2022-03-25 NOTE — Progress Notes (Signed)
[name], [name] ([id number]) Visit Report for 03/25/2022 Problem List Details Patient Name: [name], [name]. Date of Service: 03/25/2022 8:00 AM Medical Record Number: [id number] Patient Account Number: [id number] Date of Birth/Sex: 12-Dec-1960 (61 y.o. 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 Date of Service: 03/25/2022 Medical Record Number: [id number] Patient Account Number: [id number] Date of Birth/Sex: 10-03-1960 (61 y.o. M) Treating RN: Carlene Coria Primary Care Provider: [name] Other Clinician: [name] Referring Provider: [name] Treating Provider/Extender: Skipper Cliche in Treatment: 7 Diagnosis Coding ICD-10 Codes Code Description [phone] Chronic multifocal osteomyelitis, left ankle and foot E11.621 Type 2 diabetes mellitus with foot ulcer L97.524 Non-pressure chronic ulcer of other part of left foot with necrosis of bone T81.31XA Disruption of external operation (surgical) wound, not elsewhere classified, initial encounter Facility  Procedures CPT4 Code: [id number] Description: (Facility Use Only) HBOT, full body chamber, 81mn Modifier: Quantity: 4 Physician Procedures CPT4 Code: [id number]: [id number] PHYS HYPERBARIC OXYGEN THERAPY Modifier: Quantity: 1 CPT4 Code: Description: ICD-10 Diagnosis Description M86.372 Chronic multifocal osteomyelitis, left ankle and foot L97.524 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

## 2022-03-25 NOTE — Progress Notes (Signed)
JONPAUL, LUMM ([id number]) Visit Report for 03/25/2022 Arrival Information Details Patient Name: SHAKIM, FAITH. Date of Service: 03/25/2022 8:00 AM Medical Record Number: [id number] Patient Account Number: [id number] Date of Birth/Sex: Mar 03, 1961 (61 y.o. M) Treating RN: Carlene Coria Primary Care Aluel Schwarz: [name] Other Clinician: [name] Referring Modest Draeger: [name] Treating Mylez Venable/Extender: Skipper Cliche in Treatment: 7 Visit Information History Since Last Visit Added or deleted any medications: No Patient Arrived: Knee Scooter Any new allergies or adverse reactions: No Arrival Time: 08:05 Had a fall or experienced change in No Accompanied By: self activities of daily living that may affect Transfer Assistance: None risk of falls: Patient Identification Verified: Yes Signs or symptoms of abuse/neglect since last visito No Secondary Verification Process Completed: Yes Hospitalized since last visit: No Patient Requires Transmission-Based Precautions: No Implantable device outside of the clinic excluding No Patient Has Alerts: No cellular tissue based products placed in the center since last visit: Has Dressing in Place as Prescribed: Yes Pain Present Now: No Electronic Signature(s) Signed: 03/25/2022 11:34:22 AM By: [name] RCP, RRT, CHT Entered By: [name] on 03/25/2022 08:59:22 [name] ([id number]) -------------------------------------------------------------------------------- Encounter Discharge Information Details Patient Name: [name]. Date of Service: 03/25/2022 8:00 AM Medical Record Number: [id number] Patient Account Number: [id number] Date of Birth/Sex: 03-27-1961 (62 y.o. M) Treating RN: Carlene Coria Primary Care Aaisha Sliter: [name] Other Clinician: [name] Referring Zaydyn Havey: [name] Treating Machel Violante/Extender: Skipper Cliche in Treatment: 7 Encounter  Discharge Information Items Discharge Condition: Stable Ambulatory Status: Knee Scooter Discharge Destination: Home Transportation: Private Auto Accompanied By: self Schedule Follow-up Appointment: Yes Clinical Summary of Care: Notes Patient has an HBO treatment scheduled on 03/26/22 at 10:00 am. Electronic Signature(s) Signed: 03/25/2022 11:34:22 AM By: [name] RCP, RRT, CHT Entered By: [name] on 03/25/2022 11:23:05 [name] ([id number]) -------------------------------------------------------------------------------- Vitals Details Patient Name: [name]. Date of Service: 03/25/2022 8:00 AM Medical Record Number: [id number] Patient Account Number: [id number] Date of Birth/Sex: [birth year] (61 y.o. M) Treating RN: Carlene Coria Primary Care Treyshon Buchanon: [name] Other Clinician: [name] Referring Gage Weant: [name] Treating Larkin Morelos/Extender: Skipper Cliche in Treatment: 7 Vital Signs Time Taken: 08:05 Temperature (F): 97.8 Height (in): 70 Pulse (bpm): 90 Weight (lbs): 255 Respiratory Rate (breaths/min): 16 Body Mass Index (BMI): 36.6 Blood Pressure (mmHg): 122/66 Capillary Blood Glucose (mg/dl): 158 Reference Range: 80 - 120 mg / dl Electronic Signature(s) Signed: 03/25/2022 11:34:22 AM By: [name] RCP, RRT, CHT Entered By: [name] on 03/25/2022 09:00:43

## 2022-03-26 ENCOUNTER — Encounter: Payer: 59 | Admitting: Physician Assistant

## 2022-03-26 ENCOUNTER — Ambulatory Visit: Payer: 59 | Admitting: Physician Assistant

## 2022-03-26 DIAGNOSIS — M86372 Chronic multifocal osteomyelitis, left ankle and foot: Secondary | ICD-10-CM | POA: Diagnosis not present

## 2022-03-26 LAB — GLUCOSE, CAPILLARY
Glucose-Capillary: 170 mg/dL — ABNORMAL HIGH (ref 70–99)
Glucose-Capillary: 132 mg/dL — ABNORMAL HIGH (ref 70–99)

## 2022-03-26 NOTE — Progress Notes (Signed)
KENZO, OZMENT ([id number]) Visit Report for 03/26/2022 HBO Details Patient Name: [name], [name]. Date of Service: 03/26/2022 10:00 AM Medical Record Number: [id number] Patient Account Number: [id number] Date of Birth/Sex: March 12, 1961 (61 y.o. M) Treating RN: Cornell Barman Primary Care Baldwin Racicot: [name] Other Clinician: [name] Referring Eirik Schueler: [name] Treating Jionni Helming/Extender: Skipper Cliche in Treatment: 8 HBO Treatment Course Details Treatment Course Number: 1 Ordering Gaylin Osoria: [name] Total Treatments Ordered: 40 HBO Treatment Start Date: 02/18/2022 HBO Indication: Chronic Refractory Osteomyelitis to Left Ankle and Foot HBO Treatment Details Treatment Number: 24 Patient Type: Outpatient Chamber Type: Monoplace Chamber Serial #: [id number] Treatment Protocol: 2.0 ATA with 90 minutes oxygen, and no air breaks Treatment Details Compression Rate Down: 1.5 psi / minute De-Compression Rate Up: 1.5 psi / minute Compress Tx Pressure Air breaks and breathing periods Decompress Decompress Begins Reached (leave unused spaces blank) Begins Ends Chamber Pressure (ATA) 1 2 - - - - - - 2 1 Clock Time (24 hr) 10:33 10:45 - - - - - - 12:15 12:25 Treatment Length: 112 (minutes) Treatment Segments: 4 Vital Signs Capillary Blood Glucose Reference Range: 80 - 120 mg / dl HBO Diabetic Blood Glucose Intervention Range: <131 mg/dl or >249 mg/dl Time Vitals Blood Respiratory Capillary Blood Glucose Pulse Action Type: Pulse: Temperature: Taken: Pressure: Rate: Glucose (mg/dl): Meter #: Oximetry (%) Taken: Pre 09:54 132/78 72 16 98 170 1 none per protocol Post 12:38 132/72 78 16 98 132 1 none per protocol Treatment Response Treatment Toleration: Well Treatment Completion Treatment Completed without Adverse Event Status: Electronic Signature(s) Signed: 03/26/2022 1:26:36 PM By: [name] RCP, RRT, CHT Signed: 03/26/2022 4:52:08 PM By: Worthy Keeler PA-C Entered By: [name] on 03/26/2022 13:23:34 [name] ([id number]) -------------------------------------------------------------------------------- HBO Safety Checklist Details Patient Name: [name]. Date of Service: 03/26/2022 10:00 AM Medical Record Number: [id number] Patient Account Number: [id number] Date of Birth/Sex: Nov 23, 1960 (61 y.o. M) Treating RN: Cornell Barman Primary Care Jerimiah Wolman: [name] Other Clinician: [name] Referring Shariq Puig: [name] Treating Oluwatimilehin Balfour/Extender: Skipper Cliche in Treatment: 8 HBO Safety Checklist Items Safety Checklist Consent Form Signed Patient voided / foley secured and emptied When did you last eato 07:00 am Last dose of injectable or oral agent 07:00 am Ostomy pouch emptied and vented if applicable NA All implantable devices assessed, documented and approved NA Intravenous access site secured and place NA Valuables secured Linens and cotton and cotton/polyester blend (less than 51% polyester) Personal oil-based products / skin lotions / body lotions removed Wigs or hairpieces removed NA Smoking or tobacco materials removed NA Books / newspapers / magazines / loose paper removed NA Cologne, aftershave, perfume and deodorant removed Jewelry removed (may wrap wedding band) Make-up removed NA Hair care products removed Battery operated devices (external) removed NA Heating patches and chemical warmers removed NA Titanium eyewear removed NA Nail polish cured greater than 10 hours NA Casting material cured greater than 10 hours NA Hearing aids removed NA Loose dentures or partials removed NA Prosthetics have been removed NA Patient demonstrates correct use of air break device (if applicable) Patient concerns have been addressed Patient grounding bracelet on and cord attached to chamber Specifics for Inpatients (complete in addition to above) Medication sheet  sent with patient Intravenous medications needed or due during therapy sent with patient Drainage tubes (e.g. nasogastric tube or chest tube secured and vented) Endotracheal or Tracheotomy tube secured Cuff deflated of air and inflated with saline Airway suctioned Electronic  Signature(s) Signed: 03/26/2022 1:26:36 PM By: [name] RCP, RRT, CHT Entered By: [name] on 03/26/2022 11:46:15

## 2022-03-26 NOTE — Progress Notes (Signed)
[name] ([id number]) Visit Report for 03/26/2022 Arrival Information Details Patient Name: [name], [name]. Date of Service: 03/26/2022 10:00 AM Medical Record Number: [id number] Patient Account Number: [id number] Date of Birth/Sex: 1961-06-09 (61 y.o. M) Treating RN: Cornell Barman Primary Care Faydra Korman: [name] Other Clinician: [name] Referring Jozey Janco: [name] Treating Uchenna Seufert/Extender: Skipper Cliche in Treatment: 8 Visit Information History Since Last Visit Added or deleted any medications: No Patient Arrived: Knee Scooter Any new allergies or adverse reactions: No Arrival Time: 09:55 Had a fall or experienced change in No Accompanied By: self activities of daily living that may affect Transfer Assistance: None risk of falls: Patient Identification Verified: Yes Signs or symptoms of abuse/neglect since last visito No Secondary Verification Process Completed: Yes Hospitalized since last visit: No Patient Requires Transmission-Based Precautions: No Implantable device outside of the clinic excluding No Patient Has Alerts: No cellular tissue based products placed in the center since last visit: Has Dressing in Place as Prescribed: Yes Has Footwear/Offloading in Place as Prescribed: Yes Left: Surgical Shoe with Pressure Relief Insole Pain Present Now: No Electronic Signature(s) Signed: 03/26/2022 1:26:36 PM By: [name] RCP, RRT, CHT Entered By: [name] on 03/26/2022 10:02:26 [name] ([id number]) -------------------------------------------------------------------------------- Encounter Discharge Information Details Patient Name: [name]. Date of Service: 03/26/2022 10:00 AM Medical Record Number: [id number] Patient Account Number: [id number] Date of Birth/Sex: 1961/02/22 (61 y.o. M) Treating RN: Cornell Barman Primary Care Drew Herman: [name] Other Clinician: [name] Referring  Brilyn Tuller: [name] Treating Jaylina Ramdass/Extender: Skipper Cliche in Treatment: 8 Encounter Discharge Information Items Discharge Condition: Stable Ambulatory Status: Knee Scooter Discharge Destination: Home Transportation: Private Auto Accompanied By: self Schedule Follow-up Appointment: Yes Clinical Summary of Care: Notes Patient has an HBO treatment scheduled on 03/27/22 at 10:00 am. Electronic Signature(s) Signed: 03/26/2022 1:26:36 PM By: [name] RCP, RRT, CHT Entered By: [name] on 03/26/2022 13:24:25 [name] ([id number]) -------------------------------------------------------------------------------- Vitals Details Patient Name: [name]. Date of Service: 03/26/2022 10:00 AM Medical Record Number: [id number] Patient Account Number: [id number] Date of Birth/Sex: 08/12/1960 (61 y.o. M) Treating RN: Cornell Barman Primary Care Noriel Guthrie: [name] Other Clinician: [name] Referring Harm Jou: [name] Treating Malacki Mcphearson/Extender: Skipper Cliche in Treatment: 8 Vital Signs Time Taken: 09:54 Temperature (F): 98.0 Height (in): 70 Pulse (bpm): 72 Weight (lbs): 255 Respiratory Rate (breaths/min): 16 Body Mass Index (BMI): 36.6 Blood Pressure (mmHg): 132/78 Capillary Blood Glucose (mg/dl): 170 Reference Range: 80 - 120 mg / dl Electronic Signature(s) Signed: 03/26/2022 1:26:36 PM By: [name] RCP, RRT, CHT Entered By: [name] on 03/26/2022 11:44:05

## 2022-03-27 ENCOUNTER — Encounter (HOSPITAL_BASED_OUTPATIENT_CLINIC_OR_DEPARTMENT_OTHER): Payer: 59 | Admitting: Internal Medicine

## 2022-03-27 DIAGNOSIS — M86372 Chronic multifocal osteomyelitis, left ankle and foot: Secondary | ICD-10-CM

## 2022-03-27 DIAGNOSIS — E11621 Type 2 diabetes mellitus with foot ulcer: Secondary | ICD-10-CM | POA: Diagnosis not present

## 2022-03-27 DIAGNOSIS — L97524 Non-pressure chronic ulcer of other part of left foot with necrosis of bone: Secondary | ICD-10-CM | POA: Diagnosis not present

## 2022-03-27 LAB — GLUCOSE, CAPILLARY
Glucose-Capillary: 155 mg/dL — ABNORMAL HIGH (ref 70–99)
Glucose-Capillary: 176 mg/dL — ABNORMAL HIGH (ref 70–99)

## 2022-03-27 NOTE — Progress Notes (Signed)
NICHOLAUS, [name] ([id number]) Visit Report for 03/27/2022 Arrival Information Details Patient Name: [name], [name]. Date of Service: 03/27/2022 10:00 AM Medical Record Number: [id number] Patient Account Number: [id number] Date of Birth/Sex: 07/28/1960 (61 y.o. M) Treating RN: Cornell Barman Primary Care Gilma Bessette: [name] Other Clinician: [name] Referring Kalicia Dufresne: [name] Treating Doc Mandala/Extender: Yaakov Guthrie in Treatment: 8 Visit Information History Since Last Visit Added or deleted any medications: No Patient Arrived: Knee Scooter Any new allergies or adverse reactions: No Arrival Time: 09:50 Had a fall or experienced change in No Accompanied By: self activities of daily living that may affect Transfer Assistance: None risk of falls: Patient Identification Verified: Yes Signs or symptoms of abuse/neglect since last visito No Secondary Verification Process Completed: Yes Hospitalized since last visit: No Patient Requires Transmission-Based Precautions: No Implantable device outside of the clinic excluding No Patient Has Alerts: No cellular tissue based products placed in the center since last visit: Has Dressing in Place as Prescribed: Yes Has Footwear/Offloading in Place as Prescribed: Yes Left: Removable Cast Walker/Walking Boot Pain Present Now: No Electronic Signature(s) Signed: 03/27/2022 1:37:41 PM By: [name] RCP, RRT, CHT Entered By: [name] on 03/27/2022 11:37:44 [name] ([id number]) -------------------------------------------------------------------------------- Encounter Discharge Information Details Patient Name: [name]. Date of Service: 03/27/2022 10:00 AM Medical Record Number: [id number] Patient Account Number: [id number] Date of Birth/Sex: 1961-02-09 (61 y.o. M) Treating RN: Cornell Barman Primary Care Janicia Monterrosa: [name] Other Clinician: [name] Referring Wah Sabic:  [name] Treating Ilean Spradlin/Extender: Yaakov Guthrie in Treatment: 8 Encounter Discharge Information Items Discharge Condition: Stable Ambulatory Status: Knee Scooter Discharge Destination: Home Transportation: Private Auto Accompanied By: self Schedule Follow-up Appointment: Yes Clinical Summary of Care: Notes Patient has an HBO treatment scheduled on 03/28/22 at 10:00 am. Electronic Signature(s) Signed: 03/27/2022 1:37:41 PM By: [name] RCP, RRT, CHT Entered By: [name] on 03/27/2022 13:34:34 [name] ([id number]) -------------------------------------------------------------------------------- Vitals Details Patient Name: [name]. Date of Service: 03/27/2022 10:00 AM Medical Record Number: [id number] Patient Account Number: [id number] Date of Birth/Sex: April 30, 1961 (61 y.o. M) Treating RN: Cornell Barman Primary Care Divina Neale: [name] Other Clinician: [name] Referring Wilian Kwong: [name] Treating Leontae Bostock/Extender: Yaakov Guthrie in Treatment: 8 Vital Signs Time Taken: 10:13 Temperature (F): 97.9 Height (in): 70 Pulse (bpm): 90 Weight (lbs): 255 Respiratory Rate (breaths/min): 16 Body Mass Index (BMI): 36.6 Blood Pressure (mmHg): 118/64 Capillary Blood Glucose (mg/dl): 155 Reference Range: 80 - 120 mg / dl Electronic Signature(s) Signed: 03/27/2022 1:37:41 PM By: [name] RCP, RRT, CHT Entered By: [name] on 03/27/2022 11:49:49

## 2022-03-27 NOTE — Progress Notes (Signed)
TOSHIO, SLUSHER ([id number]) Visit Report for 03/27/2022 HBO Details Patient Name: [name], [name]. Date of Service: 03/27/2022 10:00 AM Medical Record Number: [id number] Patient Account Number: [id number] Date of Birth/Sex: [birth year] (61 y.o. 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 HBO Treatment Details Treatment Number: 25 Patient Type: Outpatient Chamber Type: Monoplace Chamber Serial #: [id number] Treatment Protocol: 2.0 ATA with 90 minutes oxygen, and no air breaks Treatment Details Compression Rate Down: 1.5 psi / minute De-Compression Rate Up: 1.5 psi / minute Compress Tx Pressure Air breaks and breathing periods Decompress Decompress Begins Reached (leave unused spaces blank) Begins Ends Chamber Pressure (ATA) 1 2 - - - - - - 2 1 Clock Time (24 hr) 10:35 10:46 - - - - - - 12:16 12:26 Treatment Length: 111 (minutes) Treatment Segments: 4 Vital Signs Capillary Blood Glucose Reference Range: 80 - 120 mg / dl HBO Diabetic Blood Glucose Intervention Range: <131 mg/dl or >249 mg/dl Time Vitals Blood Respiratory Capillary Blood Glucose Pulse Action Type: Pulse: Temperature: Taken: Pressure: Rate: Glucose (mg/dl): Meter #: Oximetry (%) Taken: Pre 10:13 118/64 90 16 97.9 155 1 none per protocol Post 12:37 124/76 72 16 98.1 176 1 none per protocol Treatment Response Treatment Toleration: Well Treatment Completion Treatment Completed without Adverse Event Status: HBO Attestation I certify that I supervised this HBO treatment in accordance with Medicare guidelines. A trained emergency response  team is readily Yes available per hospital policies and procedures. Continue HBOT as ordered. Yes Electronic Signature(s) Signed: 03/27/2022 1:37:41 PM By: Zackery Barefoot, RRT, CHT Signed: 03/27/2022 1:40:43 PM By: Kalman Shan DO Previous Signature: 03/27/2022 12:26:22 PM Version By: Kalman Shan DO Entered By: [name] on 03/27/2022 13:33:44 [name] ([id number]) -------------------------------------------------------------------------------- HBO Safety Checklist Details Patient Name: [name], [name]. Date of Service: 03/27/2022 10:00 AM Medical Record Number: [id number] Patient Account Number: [id number] Date of Birth/Sex: [birth year] (61 y.o. M) Treating RN: Cornell Barman Primary Care Malek Skog: [name] Other Clinician: [name] Referring Crecencio Kwiatek: [name] Treating Jadene Stemmer/Extender: Yaakov Guthrie in Treatment: 8 HBO Safety Checklist Items Safety Checklist Consent Form Signed Patient voided / foley secured and emptied When did you last eato 07:00 am Last dose of injectable or oral agent 03/26/22 pm Ostomy pouch emptied and vented if applicable NA All implantable devices assessed, documented and approved NA Intravenous access site secured and place NA Valuables secured Linens and cotton and cotton/polyester blend (less than 51% polyester) Personal oil-based products / skin lotions / body lotions removed Wigs or hairpieces removed NA Smoking or tobacco materials removed NA Books / newspapers / magazines / loose paper removed NA Cologne, aftershave, perfume and deodorant removed Jewelry removed (may wrap wedding band) Make-up removed NA Hair care products removed Battery operated devices (external) removed NA Heating patches and chemical warmers removed NA Titanium eyewear removed NA Nail polish cured greater than 10 hours NA Casting material cured greater than 10 hours NA Hearing aids  removed NA Loose dentures or partials removed NA Prosthetics have been removed NA Patient demonstrates correct use of air break device (if applicable) Patient concerns have been addressed Patient grounding bracelet on and cord attached to chamber Specifics for Inpatients (complete in addition to  above) Medication sheet sent with patient Intravenous medications needed or due during therapy sent with patient Drainage tubes (e.g. nasogastric tube or chest tube secured and vented) Endotracheal or Tracheotomy tube secured Cuff deflated of air and inflated with saline Airway suctioned Electronic Signature(s) Signed: 03/27/2022 1:37:41 PM By: [name] RCP, RRT, CHT Entered By: [name] on 03/27/2022 11:51:28

## 2022-03-28 ENCOUNTER — Encounter: Payer: 59 | Admitting: Physician Assistant

## 2022-03-28 DIAGNOSIS — M86372 Chronic multifocal osteomyelitis, left ankle and foot: Secondary | ICD-10-CM | POA: Diagnosis not present

## 2022-03-28 LAB — GLUCOSE, CAPILLARY
Glucose-Capillary: 150 mg/dL — ABNORMAL HIGH (ref 70–99)
Glucose-Capillary: 228 mg/dL — ABNORMAL HIGH (ref 70–99)

## 2022-03-28 NOTE — Progress Notes (Signed)
LEELYND, MALDONADO ([id number]) Visit Report for 03/28/2022 Arrival Information Details Patient Name: [name], [name]. Date of Service: 03/28/2022 10:00 AM Medical Record Number: [id number] Patient Account Number: [id number] Date of Birth/Sex: 1961-03-13 (61 y.o. M) Treating RN: Cornell Barman Primary Care Kellan Boehlke: [name] Other Clinician: [name] Referring Chantee Cerino: [name] Treating Keiasia Christianson/Extender: Skipper Cliche in Treatment: 8 Visit Information History Since Last Visit Added or deleted any medications: No Patient Arrived: Knee Scooter Any new allergies or adverse reactions: No Arrival Time: 09:45 Had a fall or experienced change in No Accompanied By: self activities of daily living that may affect Transfer Assistance: None risk of falls: Patient Identification Verified: Yes Signs or symptoms of abuse/neglect since last visito No Secondary Verification Process Completed: Yes Hospitalized since last visit: No Patient Requires Transmission-Based Precautions: No Implantable device outside of the clinic excluding No Patient Has Alerts: No cellular tissue based products placed in the center since last visit: Has Dressing in Place as Prescribed: Yes Has Footwear/Offloading in Place as Prescribed: Yes Left: Removable Cast Walker/Walking Boot Pain Present Now: No Electronic Signature(s) Signed: 03/28/2022 1:05:21 PM By: [name] RCP, RRT, CHT Entered By: [name] on 03/28/2022 10:08:53 [name] ([id number]) -------------------------------------------------------------------------------- Encounter Discharge Information Details Patient Name: [name]. Date of Service: 03/28/2022 10:00 AM Medical Record Number: [id number] Patient Account Number: [id number] Date of Birth/Sex: 26-Nov-1960 (61 y.o. M) Treating RN: Cornell Barman Primary Care Abbygale Lapid: [name] Other Clinician: [name] Referring Genesys Coggeshall:  [name] Treating Ayasha Ellingsen/Extender: Skipper Cliche in Treatment: 8 Encounter Discharge Information Items Discharge Condition: Stable Ambulatory Status: Knee Scooter Discharge Destination: Home Transportation: Private Auto Accompanied By: self Schedule Follow-up Appointment: Yes Clinical Summary of Care: Notes Patient has an HBO treatment scheduled on 03/29/22 at 9:30 am. Electronic Signature(s) Signed: 03/28/2022 1:05:21 PM By: [name] RCP, RRT, CHT Entered By: [name] on 03/28/2022 13:02:46 [name] ([id number]) -------------------------------------------------------------------------------- Vitals Details Patient Name: [name]. Date of Service: 03/28/2022 10:00 AM Medical Record Number: [id number] Patient Account Number: [id number] Date of Birth/Sex: 01-11-1961 (61 y.o. M) Treating RN: Cornell Barman Primary Care Delsy Etzkorn: [name] Other Clinician: [name] Referring Modesta Sammons: [name] Treating Lazara Grieser/Extender: Skipper Cliche in Treatment: 8 Vital Signs Time Taken: 09:54 Temperature (F): 97.6 Height (in): 70 Pulse (bpm): 84 Weight (lbs): 255 Respiratory Rate (breaths/min): 16 Body Mass Index (BMI): 36.6 Blood Pressure (mmHg): 122/66 Capillary Blood Glucose (mg/dl): 150 Reference Range: 80 - 120 mg / dl Electronic Signature(s) Signed: 03/28/2022 1:05:21 PM By: [name] RCP, RRT, CHT Entered By: [name] on 03/28/2022 10:09:27

## 2022-03-28 NOTE — Progress Notes (Signed)
NILSON, TABORA ([id number]) Visit Report for 03/28/2022 HBO Details Patient Name: [name], [name]. Date of Service: 03/28/2022 10:00 AM Medical Record Number: [id number] Patient Account Number: [id number] Date of Birth/Sex: 1961/06/27 (61 y.o. M) Treating RN: Cornell Barman Primary Care Terran Klinke: [name] Other Clinician: [name] Referring Tanay Massiah: [name] Treating Nikodem Leadbetter/Extender: Skipper Cliche in Treatment: 8 HBO Treatment Course Details Treatment Course Number: 1 Ordering Azarria Balint: [name] Total Treatments Ordered: 40 HBO Treatment Start Date: 02/18/2022 HBO Indication: Chronic Refractory Osteomyelitis to Left Ankle and Foot HBO Treatment Details Treatment Number: 26 Patient Type: Outpatient Chamber Type: Monoplace Chamber Serial #: [id number] Treatment Protocol: 2.0 ATA with 90 minutes oxygen, and no air breaks Treatment Details Compression Rate Down: 1.5 psi / minute De-Compression Rate Up: 1.5 psi / minute Compress Tx Pressure Air breaks and breathing periods Decompress Decompress Begins Reached (leave unused spaces blank) Begins Ends Chamber Pressure (ATA) 1 2 - - - - - - 2 1 Clock Time (24 hr) 10:30 10:42 - - - - - - 12:13 12:23 Treatment Length: 113 (minutes) Treatment Segments: 4 Vital Signs Capillary Blood Glucose Reference Range: 80 - 120 mg / dl HBO Diabetic Blood Glucose Intervention Range: <131 mg/dl or >249 mg/dl Time Vitals Blood Respiratory Capillary Blood Glucose Pulse Action Type: Pulse: Temperature: Taken: Pressure: Rate: Glucose (mg/dl): Meter #: Oximetry (%) Taken: Pre 09:54 122/66 84 16 97.6 150 1 none per protocol Post 12:33 124/72 72 16 98.3 228 1 none per protocol Treatment Response Treatment Toleration: Well Treatment Completion Treatment Completed without Adverse Event Status: Electronic Signature(s) Signed: 03/28/2022 1:05:21 PM By: [name] RCP, RRT, CHT Signed: 03/28/2022 3:32:36 PM By: Worthy Keeler PA-C Previous Signature: 03/28/2022 10:43:55 AM Version By: Worthy Keeler PA-C Entered By: [name] on 03/28/2022 13:01:43 [name] ([id number]) -------------------------------------------------------------------------------- HBO Safety Checklist Details Patient Name: [name]. Date of Service: 03/28/2022 10:00 AM Medical Record Number: [id number] Patient Account Number: [id number] Date of Birth/Sex: 22-Sep-1960 (61 y.o. M) Treating RN: Cornell Barman Primary Care Yakir Wenke: [name] Other Clinician: [name] Referring Giulianna Rocha: [name] Treating Demarrion Meiklejohn/Extender: Skipper Cliche in Treatment: 8 HBO Safety Checklist Items Safety Checklist Consent Form Signed Patient voided / foley secured and emptied When did you last eato 07:00 am Last dose of injectable or oral agent 03/27/22 pm Ostomy pouch emptied and vented if applicable NA All implantable devices assessed, documented and approved NA Intravenous access site secured and place NA Valuables secured Linens and cotton and cotton/polyester blend (less than 51% polyester) Personal oil-based products / skin lotions / body lotions removed Wigs or hairpieces removed NA Smoking or tobacco materials removed NA Books / newspapers / magazines / loose paper removed NA Cologne, aftershave, perfume and deodorant removed Jewelry removed (may wrap wedding band) Make-up removed NA Hair care products removed Battery operated devices (external) removed NA Heating patches and chemical warmers removed NA Titanium eyewear removed NA Nail polish cured greater than 10 hours NA Casting material cured greater than 10 hours NA Hearing aids removed NA Loose dentures or partials removed NA Prosthetics have been removed NA Patient demonstrates correct use of air break device (if applicable) Patient concerns have been addressed Patient grounding bracelet on and cord attached to  chamber Specifics for Inpatients (complete in addition to above) Medication sheet sent with patient Intravenous medications needed or due during therapy sent with patient Drainage tubes (e.g. nasogastric tube or chest tube secured and vented) Endotracheal or Tracheotomy tube secured  Cuff deflated of air and inflated with saline Airway suctioned Electronic Signature(s) Signed: 03/28/2022 1:05:21 PM By: [name] RCP, RRT, CHT Entered By: [name] on 03/28/2022 10:10:22

## 2022-03-29 ENCOUNTER — Encounter: Payer: 59 | Admitting: Physician Assistant

## 2022-03-29 DIAGNOSIS — M86372 Chronic multifocal osteomyelitis, left ankle and foot: Secondary | ICD-10-CM | POA: Diagnosis not present

## 2022-03-29 LAB — GLUCOSE, CAPILLARY
Glucose-Capillary: 153 mg/dL — ABNORMAL HIGH (ref 70–99)
Glucose-Capillary: 211 mg/dL — ABNORMAL HIGH (ref 70–99)

## 2022-03-29 NOTE — Progress Notes (Signed)
[name], [name] ([id number]) Visit Report for 03/29/2022 Arrival Information Details Patient Name: [name], [name]. Date of Service: 03/29/2022 9:30 AM Medical Record Number: [id number] Patient Account Number: [id number] Date of Birth/Sex: [birth year] (61 y.o. M) Treating RN: Cornell Barman Primary Care Carizma Dunsworth: [name] Other Clinician: [name] Referring Dionicia Cerritos: [name] Treating Menaal Russum/Extender: Skipper Cliche in Treatment: 8 Visit Information History Since Last Visit Added or deleted any medications: No Patient Arrived: Knee Scooter Any new allergies or adverse reactions: No Arrival Time: 09:35 Had a fall or experienced change in No Accompanied By: self activities of daily living that may affect Transfer Assistance: None risk of falls: Patient Identification Verified: Yes Signs or symptoms of abuse/neglect since last visito No Secondary Verification Process Completed: Yes Hospitalized since last visit: No Patient Requires Transmission-Based Precautions: No Implantable device outside of the clinic excluding No Patient Has Alerts: No cellular tissue based products placed in the center since last visit: Pain Present Now: No Electronic Signature(s) Signed: 03/29/2022 1:07:54 PM By: [name] RCP, RRT, CHT Entered By: [name] on 03/29/2022 10:50:33 [name] ([id number]) -------------------------------------------------------------------------------- Encounter Discharge Information Details Patient Name: [name]. Date of Service: 03/29/2022 9:30 AM Medical Record Number: [id number] Patient Account Number: [id number] Date of Birth/Sex: Jun 23, 1961 (61 y.o. M) Treating RN: Cornell Barman Primary Care Namrata Dangler: [name] Other Clinician: [name] Referring Contrell Ballentine: [name] Treating Lando Alcalde/Extender: Skipper Cliche in Treatment: 8 Encounter Discharge Information Items Discharge Condition:  Stable Ambulatory Status: Knee Scooter Discharge Destination: Home Transportation: Private Auto Accompanied By: self Schedule Follow-up Appointment: Yes Clinical Summary of Care: Notes Patient has an HBO treatment scheduled on 04/01/22 at 10:00 am. Electronic Signature(s) Signed: 03/29/2022 1:07:54 PM By: [name] RCP, RRT, CHT Entered By: [name] on 03/29/2022 12:10:30 [name] ([id number]) -------------------------------------------------------------------------------- Vitals Details Patient Name: [name]. Date of Service: 03/29/2022 9:30 AM Medical Record Number: [id number] Patient Account Number: [id number] Date of Birth/Sex: 05-16-1961 (61 y.o. M) Treating RN: Cornell Barman Primary Care Rowe Warman: [name] Other Clinician: [name] Referring Tiare Rohlman: [name] Treating Broedy Osbourne/Extender: Skipper Cliche in Treatment: 8 Vital Signs Time Taken: 09:39 Temperature (F): 9834 Height (in): 70 Pulse (bpm): 84 Weight (lbs): 255 Respiratory Rate (breaths/min): 16 Body Mass Index (BMI): 36.6 Blood Pressure (mmHg): 128/68 Capillary Blood Glucose (mg/dl): 153 Reference Range: 80 - 120 mg / dl Electronic Signature(s) Signed: 03/29/2022 1:07:54 PM By: [name] RCP, RRT, CHT Entered By: [name] on 03/29/2022 10:52:09

## 2022-03-29 NOTE — Progress Notes (Signed)
[name] ([id number]) Visit Report for 03/29/2022 HBO Details Patient Name: [name], [name]. Date of Service: 03/29/2022 9:30 AM Medical Record Number: [id number] Patient Account Number: [id number] Date of Birth/Sex: 11-09-1960 (61 y.o. 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 HBO Treatment Details Treatment Number: 27 Patient Type: Outpatient Chamber Type: Monoplace Chamber Serial #: [id number] Treatment Protocol: 2.0 ATA with 90 minutes oxygen, and no air breaks Treatment Details Compression Rate Down: 1.5 psi / minute De-Compression Rate Up: 1.5 psi / minute Compress Tx Pressure Air breaks and breathing periods Decompress Decompress Begins Reached (leave unused spaces blank) Begins Ends Chamber Pressure (ATA) 1 2 - - - - - - 2 1 Clock Time (24 hr) 09:56 10:08 - - - - - - 11:40 11:49 Treatment Length: 113 (minutes) Treatment Segments: 4 Vital Signs Capillary Blood Glucose Reference Range: 80 - 120 mg / dl HBO Diabetic Blood Glucose Intervention Range: <131 mg/dl or >249 mg/dl Time Vitals Blood Respiratory Capillary Blood Glucose Pulse Action Type: Pulse: Temperature: Taken: Pressure: Rate: Glucose (mg/dl): Meter #: Oximetry (%) Taken: Pre 09:39 128/68 84 16 9834 153 1 none per protocol Post 12:01 128/68 72 16 97.9 211 1 none per protocol Treatment Response Treatment Toleration: Well Treatment Completion Treatment Completed without Adverse Event Status: Electronic Signature(s) Signed: 03/29/2022 12:17:43 PM By: Worthy Keeler PA-C Signed: 03/29/2022 1:07:54 PM By: [name]  RCP, RRT, CHT Previous Signature: 03/29/2022 11:56:40 AM Version By: Worthy Keeler PA-C Previous Signature: 03/29/2022 11:37:09 AM Version By: Worthy Keeler PA-C Entered By: [name] on 03/29/2022 12:09:03 [name] ([id number]) -------------------------------------------------------------------------------- HBO Safety Checklist Details Patient Name: [name]. Date of Service: 03/29/2022 9:30 AM Medical Record Number: [id number] Patient Account Number: [id number] Date of Birth/Sex: [birth year] (62 y.o. M) Treating RN: Cornell Barman Primary Care Kortnie Stovall: [name] Other Clinician: [name] Referring Qusay Villada: [name] Treating Fran Mcree/Extender: Skipper Cliche in Treatment: 8 HBO Safety Checklist Items Safety Checklist Consent Form Signed Patient voided / foley secured and emptied When did you last eato 07:00 am Last dose of injectable or oral agent 07:00 am Ostomy pouch emptied and vented if applicable NA All implantable devices assessed, documented and approved NA Intravenous access site secured and place NA Valuables secured Linens and cotton and cotton/polyester blend (less than 51% polyester) Personal oil-based products / skin lotions / body lotions removed Wigs or hairpieces removed NA Smoking or tobacco materials removed NA Books / newspapers / magazines / loose paper removed NA Cologne, aftershave, perfume and deodorant removed Jewelry removed (may wrap wedding band) Make-up removed NA Hair care products removed Battery operated devices (external) removed NA Heating patches and chemical warmers removed NA Titanium eyewear removed NA Nail polish cured greater than 10 hours NA Casting material cured greater than 10 hours NA Hearing aids removed NA Loose dentures or partials removed NA Prosthetics have been removed NA Patient demonstrates correct use of air break device (if applicable) Patient concerns have  been addressed Patient grounding bracelet on and cord attached to chamber Specifics for Inpatients (complete in addition to above) Medication sheet sent with patient Intravenous medications needed or due during therapy sent with patient Drainage tubes (e.g. nasogastric tube  or chest tube secured and vented) Endotracheal or Tracheotomy tube secured Cuff deflated of air and inflated with saline Airway suctioned Electronic Signature(s) Signed: 03/29/2022 1:07:54 PM By: [name] RCP, RRT, CHT Entered By: [name] on 03/29/2022 10:57:22

## 2022-04-01 ENCOUNTER — Encounter: Payer: 59 | Attending: Physician Assistant | Admitting: Physician Assistant

## 2022-04-01 DIAGNOSIS — E1142 Type 2 diabetes mellitus with diabetic polyneuropathy: Secondary | ICD-10-CM | POA: Diagnosis not present

## 2022-04-01 DIAGNOSIS — Z89411 Acquired absence of right great toe: Secondary | ICD-10-CM | POA: Insufficient documentation

## 2022-04-01 DIAGNOSIS — M86372 Chronic multifocal osteomyelitis, left ankle and foot: Secondary | ICD-10-CM | POA: Insufficient documentation

## 2022-04-01 DIAGNOSIS — L97524 Non-pressure chronic ulcer of other part of left foot with necrosis of bone: Secondary | ICD-10-CM | POA: Diagnosis not present

## 2022-04-01 DIAGNOSIS — E1169 Type 2 diabetes mellitus with other specified complication: Secondary | ICD-10-CM | POA: Insufficient documentation

## 2022-04-01 DIAGNOSIS — T8131XA Disruption of external operation (surgical) wound, not elsewhere classified, initial encounter: Secondary | ICD-10-CM | POA: Insufficient documentation

## 2022-04-01 DIAGNOSIS — E11621 Type 2 diabetes mellitus with foot ulcer: Secondary | ICD-10-CM | POA: Diagnosis not present

## 2022-04-01 LAB — GLUCOSE, CAPILLARY
Glucose-Capillary: 231 mg/dL — ABNORMAL HIGH (ref 70–99)
Glucose-Capillary: 206 mg/dL — ABNORMAL HIGH (ref 70–99)

## 2022-04-01 NOTE — Progress Notes (Signed)
[name], [name] ([id number]) Visit Report for 04/01/2022 Arrival Information Details Patient Name: [name], [name]. Date of Service: 04/01/2022 10:00 AM Medical Record Number: [id number] Patient Account Number: [id number] Date of Birth/Sex: 12/14/1960 (61 y.o. M) Treating RN: Cornell Barman Primary Care Fahim Kats: [name] Other Clinician: [name] Referring Tiari Andringa: [name] Treating Annalisa Colonna/Extender: Skipper Cliche in Treatment: 8 Visit Information History Since Last Visit Added or deleted any medications: No Patient Arrived: Knee Scooter Any new allergies or adverse reactions: No Arrival Time: 10:00 Had a fall or experienced change in No Accompanied By: self activities of daily living that may affect Transfer Assistance: None risk of falls: Patient Identification Verified: Yes Signs or symptoms of abuse/neglect since last visito No Secondary Verification Process Completed: Yes Hospitalized since last visit: No Patient Requires Transmission-Based Precautions: No Implantable device outside of the clinic excluding No Patient Has Alerts: No cellular tissue based products placed in the center since last visit: Has Dressing in Place as Prescribed: Yes Has Footwear/Offloading in Place as Prescribed: Yes Left: Removable Cast Walker/Walking Boot Pain Present Now: No Electronic Signature(s) Signed: 04/01/2022 1:33:48 PM By: [name] RCP, RRT, CHT Entered By: [name] on 04/01/2022 12:21:37 [name] ([id number]) -------------------------------------------------------------------------------- Encounter Discharge Information Details Patient Name: [name], [name]. Date of Service: 04/01/2022 10:00 AM Medical Record Number: [id number] Patient Account Number: [id number] Date of Birth/Sex: [birth year] (61 y.o. M) Treating RN: Cornell Barman Primary Care Asahd Can: [name] Other Clinician: [name] Referring Dezzie Badilla:  [name] Treating Rosie Torrez/Extender: Skipper Cliche in Treatment: 8 Encounter Discharge Information Items Discharge Condition: Stable Ambulatory Status: Knee Scooter Discharge Destination: Home Transportation: Private Auto Accompanied By: self Schedule Follow-up Appointment: Yes Clinical Summary of Care: Notes Patient has an HBO treatment scheduled on 04/02/22 at 10:00 am. Electronic Signature(s) Signed: 04/01/2022 1:33:48 PM By: [name] RCP, RRT, CHT Entered By: [name] on 04/01/2022 13:31:45 [name] ([id number]) -------------------------------------------------------------------------------- Vitals Details Patient Name: [name]. Date of Service: 04/01/2022 10:00 AM Medical Record Number: [id number] Patient Account Number: [id number] Date of Birth/Sex: 01-04-1961 (61 y.o. M) Treating RN: Cornell Barman Primary Care Jaeden Westbay: [name] Other Clinician: [name] Referring Gunther Zawadzki: [name] Treating Mackey Varricchio/Extender: Skipper Cliche in Treatment: 8 Vital Signs Time Taken: 10:06 Temperature (F): 97.8 Height (in): 70 Pulse (bpm): 72 Weight (lbs): 255 Respiratory Rate (breaths/min): 16 Body Mass Index (BMI): 36.6 Blood Pressure (mmHg): 122/68 Capillary Blood Glucose (mg/dl): 231 Reference Range: 80 - 120 mg / dl Electronic Signature(s) Signed: 04/01/2022 1:33:48 PM By: [name] RCP, RRT, CHT Entered By: [name] on 04/01/2022 12:22:12

## 2022-04-02 ENCOUNTER — Encounter: Payer: 59 | Admitting: Physician Assistant

## 2022-04-02 DIAGNOSIS — E11621 Type 2 diabetes mellitus with foot ulcer: Secondary | ICD-10-CM | POA: Diagnosis not present

## 2022-04-02 LAB — GLUCOSE, CAPILLARY
Glucose-Capillary: 268 mg/dL — ABNORMAL HIGH (ref 70–99)
Glucose-Capillary: 197 mg/dL — ABNORMAL HIGH (ref 70–99)

## 2022-04-02 NOTE — Progress Notes (Signed)
[name], [name] ([id number]) Visit Report for 04/02/2022 Arrival Information Details Patient Name: [name], [name]. Date of Service: 04/02/2022 10:00 AM Medical Record Number: [id number] Patient Account Number: [id number] Date of Birth/Sex: 1960-11-01 (61 y.o. M) Treating RN: [name] Primary Care [name]: [name] Other Clinician: Jacqulyn [name] Referring [name]: [name] Treating Kamerin Axford/Extender: [name] in Treatment: 9 Visit Information History Since Last Visit Added or deleted any medications: No Patient Arrived: Knee Scooter Any new allergies or adverse reactions: No Arrival Time: 09:00 Had a fall or experienced change in No Accompanied By: self activities of daily living that may affect Transfer Assistance: None risk of falls: Patient Identification Verified: Yes Signs or symptoms of abuse/neglect since last visito No Secondary Verification Process Completed: Yes Hospitalized since last visit: No Patient Requires Transmission-Based Precautions: No Implantable device outside of the clinic excluding No Patient Has Alerts: No cellular tissue based products placed in the center since last visit: Has Dressing in Place as Prescribed: Yes Has Footwear/Offloading in Place as Prescribed: Yes Left: Removable Cast Walker/Walking Boot Pain Present Now: No Electronic Signature(s) Signed: 04/02/2022 1:17:21 PM By: [name] RCP, RRT, CHT Entered By: [name] on 04/02/2022 09:22:29 [name] ([id number]) -------------------------------------------------------------------------------- Encounter Discharge Information Details Patient Name: [name], [name]. Date of Service: 04/02/2022 10:00 AM Medical Record Number: [id number] Patient Account Number: [id number] Date of Birth/Sex: 1961-01-27 (61 y.o. M) Treating RN: [name] Primary Care Aveen Stansel: [name] Other Clinician: Jacqulyn [name] Referring Keiarra Charon:  [name] Treating Keleigh Kazee/Extender: [name] in Treatment: 9 Encounter Discharge Information Items Discharge Condition: Stable Ambulatory Status: Knee Scooter Discharge Destination: Home Transportation: Private Auto Accompanied By: self Schedule Follow-up Appointment: Yes Clinical Summary of Care: Notes Patient has an HBO treatment scheduled on 04/03/22 at 10:00 am. Electronic Signature(s) Signed: 04/02/2022 1:17:21 PM By: [name] RCP, RRT, CHT Entered By: [name] on 04/02/2022 13:12:06 [name] ([id number]) -------------------------------------------------------------------------------- Vitals Details Patient Name: [name]. Date of Service: 04/02/2022 10:00 AM Medical Record Number: [id number] Patient Account Number: [id number] Date of Birth/Sex: 09/01/1960 (61 y.o. M) Treating RN: [name] Primary Care Berk Pilot: [name] Other Clinician: Jacqulyn [name] Referring Connery Shiffler: [name] Treating Masaichi Kracht/Extender: [name] in Treatment: 9 Vital Signs Time Taken: 09:00 Temperature (F): 98.0 Height (in): 70 Pulse (bpm): 100 Weight (lbs): 255 Respiratory Rate (breaths/min): 16 Body Mass Index (BMI): 36.6 Blood Pressure (mmHg): 142/81 Reference Range: 80 - 120 mg / dl Electronic Signature(s) Signed: 04/02/2022 1:17:21 PM By: [name] RCP, RRT, CHT Entered By: [name] on 04/02/2022 09:23:11

## 2022-04-02 NOTE — Progress Notes (Signed)
RUVEN, CORRADI ([id number]) Visit Report for 04/01/2022 HBO Details Patient Name: [name], [name]. Date of Service: 04/01/2022 10:00 AM Medical Record Number: [id number] Patient Account Number: [id number] Date of Birth/Sex: 08-16-1960 (61 y.o. 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 Foot HBO Treatment Details Treatment Number: 28 Patient Type: Outpatient Chamber Type: Monoplace Chamber Serial #: [id number] Treatment Protocol: 2.0 ATA with 90 minutes oxygen, and no air breaks Treatment Details Compression Rate Down: 1.5 psi / minute De-Compression Rate Up: 1.5 psi / minute Compress Tx Pressure Air breaks and breathing periods Decompress Decompress Begins Reached (leave unused spaces blank) Begins Ends Chamber Pressure (ATA) 1 2 - - - - - - 2 1 Clock Time (24 hr) 10:42 10:53 - - - - - - 12:23 12:33 Treatment Length: 111 (minutes) Treatment Segments: 4 Vital Signs Capillary Blood Glucose Reference Range: 80 - 120 mg / dl HBO Diabetic Blood Glucose Intervention Range: <131 mg/dl or >249 mg/dl Time Vitals Blood Respiratory Capillary Blood Glucose Pulse Action Type: Pulse: Temperature: Taken: Pressure: Rate: Glucose (mg/dl): Meter #: Oximetry (%) Taken: Pre 10:06 122/68 72 16 97.8 231 1 none per protocol Post 12:45 130/72 72 16 97.9 206 1 none per protocol Treatment Response Treatment Toleration: Well Treatment Completion Treatment Completed without Adverse Event Status: Electronic Signature(s) Signed: 04/01/2022 1:33:48 PM By: [name] RCP, RRT, CHT Signed: 04/01/2022 5:13:16 PM By: Worthy Keeler PA-C Entered By: [name] on 04/01/2022 13:30:56 [name] ([id number]) -------------------------------------------------------------------------------- HBO Safety Checklist Details Patient Name: [name]. Date of Service: 04/01/2022 10:00 AM Medical Record Number: [id number] Patient Account Number: [id number] Date of Birth/Sex: 09-26-1960 (61 y.o. M) Treating RN: Cornell Barman Primary Care Hieu Herms: [name] Other Clinician: [name] Referring Cathlene Gardella: [name] Treating Orris Perin/Extender: Skipper Cliche in Treatment: 8 HBO Safety Checklist Items Safety Checklist Consent Form Signed Patient voided / foley secured and emptied When did you last eato 07:00 am Last dose of injectable or oral agent 03/31/22 pm Ostomy pouch emptied and vented if applicable NA All implantable devices assessed, documented and approved NA Intravenous access site secured and place NA Valuables secured Linens and cotton and cotton/polyester blend (less than 51% polyester) Personal oil-based products / skin lotions / body lotions removed Wigs or hairpieces removed NA Smoking or tobacco materials removed NA Books / newspapers / magazines / loose paper removed NA Cologne, aftershave, perfume and deodorant removed Jewelry removed (may wrap wedding band) Make-up removed NA Hair care products removed Battery operated devices (external) removed NA Heating patches and chemical warmers removed NA Titanium eyewear removed NA Nail polish cured greater than 10 hours NA Casting material cured greater than 10 hours NA Hearing aids removed NA Loose dentures or partials removed NA Prosthetics have been removed NA Patient demonstrates correct use of air break device (if applicable) Patient concerns have been addressed Patient grounding bracelet on and cord attached to chamber Specifics for Inpatients (complete in addition to above) Medication  sheet sent with patient Intravenous medications needed or due during therapy sent with patient Drainage tubes (e.g. nasogastric tube or chest tube secured and vented) Endotracheal or Tracheotomy tube secured Cuff deflated of air and inflated with saline Airway suctioned Electronic  Signature(s) Signed: 04/01/2022 1:33:48 PM By: [name] RCP, RRT, CHT Entered By: [name] on 04/01/2022 12:18:56

## 2022-04-02 NOTE — Progress Notes (Signed)
CEASER, EBELING ([id number]) Visit Report for 04/02/2022 Problem List Details Patient Name: [name], [name]. Date of Service: 04/02/2022 10:00 AM Medical Record Number: [id number] Patient Account Number: [id number] Date of Birth/Sex: 1961/04/20 (61 y.o. 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 Date of Service: 04/02/2022 Medical Record Number: [id number] Patient Account Number: [id number] Date of Birth/Sex: Nov 07, 1960 (61 y.o. M) Treating RN: Cornell Barman Primary Care Provider: [name] Other Clinician: [name] Referring Provider: [name] Treating Provider/Extender: Skipper Cliche in Treatment: 9 Diagnosis Coding ICD-10 Codes Code Description [id number] Chronic multifocal osteomyelitis, left ankle and foot E11.621 Type 2 diabetes mellitus with foot ulcer L97.524 Non-pressure chronic ulcer of other part of left foot with necrosis of bone T81.31XA Disruption of external operation (surgical) wound, not elsewhere classified, initial encounter Facility  Procedures CPT4 Code: 28003491 Description: (Facility Use Only) HBOT, full body chamber, 66mn Modifier: Quantity: 4 Physician Procedures CPT4 Code: [id number]: [phone] PHYS HYPERBARIC OXYGEN THERAPY Modifier: Quantity: 1 CPT4 Code: Description: ICD-10 Diagnosis Description M86.372 Chronic multifocal osteomyelitis, left ankle and foot L97.524 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

## 2022-04-02 NOTE — Progress Notes (Signed)
IZICK, GASBARRO ([id number]) Visit Report for 04/02/2022 Chief Complaint Document Details Patient Name: [name], [name]. Date of Service: 04/02/2022 9:00 AM Medical Record Number: [id number] Patient Account Number: [id number] Date of Birth/Sex: [birth year] (61 y.o. M) Treating RN: Cornell Barman Primary Care Provider: [name] Other Clinician: Massie Kluver Referring Provider: [name] Treating Provider/Extender: Skipper Cliche in Treatment: 9 Information Obtained from: Patient Chief Complaint Left lateral foot ulcer Electronic Signature(s) Signed: 04/02/2022 9:15:03 AM By: Worthy Keeler PA-C Entered By: Worthy Keeler on 04/02/2022 09:15:02 [name] ([id number]) -------------------------------------------------------------------------------- Physician Orders Details Patient Name: [name]. Date of Service: 04/02/2022 9:00 AM Medical Record Number: [id number] Patient Account Number: [id number] Date of Birth/Sex: 11/05/1960 (61 y.o. M) Treating RN: Cornell Barman Primary Care Provider: [name] Other Clinician: Massie Kluver Referring Provider: [name] Treating Provider/Extender: Skipper Cliche in Treatment: 9 Verbal / Phone Orders: No Diagnosis Coding ICD-10 Coding Code Description [phone] Chronic multifocal osteomyelitis, left ankle and foot E11.621 Type 2 diabetes mellitus with foot ulcer L97.524 Non-pressure chronic ulcer of other part of left foot with necrosis of bone T81.31XA Disruption of external operation (surgical) wound, not elsewhere classified, initial encounter Follow-up Appointments o Return Appointment in 1 week. Bathing/ Shower/ Hygiene o May shower with wound dressing protected with water repellent cover or cast protector. Edema Control - Lymphedema / Segmental Compressive Device / Other o Elevate, Exercise Daily and Avoid Standing for Long Periods of Time. o Elevate legs to the level of the heart and pump  ankles as often as possible o Elevate leg(s) parallel to the floor when sitting. Off-Loading o Open toe surgical shoe - left o Other: - knee scooter Additional Orders / Instructions o Other: - need daily dressing changes Hyperbaric Oxygen Therapy Wound #14 Left,Lateral Foot o Evaluate for HBO Therapy o Indication and location: - Left foot osteomyelitis o If appropriate for treatment, begin HBOT per protocol: o 2.0 ATA for 90 Minutes without Air Breaks o One treatment per day (delivered Monday through Friday unless otherwise specified in Special Instructions below): o Total # of Treatments: - 40 o Antihistamine 30 minutes prior to HBO Treatment, difficulty clearing ears. o Finger stick Blood Glucose Pre- and Post- HBOT Treatment. o Follow Hyperbaric Oxygen Glycemia Protocol Wound Treatment Wound #14 - Foot Wound Laterality: Left, Lateral Cleanser: [employer] Ancillary Kit - 15 Day Supply (Generic) 3 x Per Week/30 Days Discharge Instructions: Use supplies as instructed; Kit contains: (15) Saline Bullets; (15) 3x3 Gauze; 15 pr Gloves Cleanser: Soap and Water 3 x Per Week/30 Days Discharge Instructions: Gently cleanse wound with antibacterial soap, rinse and pat dry prior to dressing wounds Cleanser: Wound Cleanser 3 x Per Week/30 Days Discharge Instructions: Wash your hands with soap and water. Remove old dressing, discard into plastic bag and place into trash. Cleanse the wound with Wound Cleanser prior to applying a clean dressing using gauze sponges, not tissues or cotton balls. Do not scrub or use excessive force. Pat dry using gauze sponges, not tissue or cotton balls. Primary Dressing: Hydrofera Blue Ready Transfer Foam, 4x5 (in/in) (Dispense As Written) 3 x Per Week/30 Days Discharge Instructions: Apply Hydrofera Blue Ready to wound bed as directed Secondary Dressing: ABD Pad 5x9 (in/in) (Generic) 3 x Per Week/30 Days [place], PERRA ([id number]) Discharge  Instructions: Cover with ABD pad Secured With: Medipore Tape - 11M Medipore H Soft Cloth Surgical Tape, 2x2 (in/yd) (Generic) 3 x Per Week/30 Days Secured With: [employer] Sterile or Non-Sterile 6-ply 4.5x4 (  yd/yd) (Generic) 3 x Per Week/30 Days Discharge Instructions: Apply Kerlix as directed GLYCEMIA INTERVENTIONS PROTOCOL PRE-HBO GLYCEMIA INTERVENTIONS ACTION INTERVENTION Obtain pre-HBO capillary blood glucose (ensure 1 physician order is in chart). A. Notify HBO physician and await physician orders. 2 If result is 70 mg/dl or below: B. If the result meets the hospital definition of a critical result, follow hospital policy. A. Give patient an 8 ounce Glucerna Shake, an 8 ounce Ensure, or 8 ounces of a Glucerna/Ensure equivalent dietary supplement*. B. Wait 30 minutes. If result is 71 mg/dl to 130 mg/dl: C. Retest patientos capillary blood glucose (CBG). D. If result greater than or equal to 110 mg/dl, proceed with HBO. If result less than 110 mg/dl, notify HBO physician and consider holding HBO. If result is 131 mg/dl to 249 mg/dl: A. Proceed with HBO. A. Notify HBO physician and await physician orders. B. It is recommended to hold HBO and do If result is 250 mg/dl or greater: blood/urine ketone testing. C. If the result meets the hospital definition of a critical result, follow hospital policy. POST-HBO GLYCEMIA INTERVENTIONS ACTION INTERVENTION Obtain post HBO capillary blood glucose (ensure 1 physician order is in chart). A. Notify HBO physician and await physician orders. 2 If result is 70 mg/dl or below: B. If the result meets the hospital definition of a critical result, follow hospital policy. A. Give patient an 8 ounce Glucerna Shake, an 8 ounce Ensure, or 8 ounces of a Glucerna/Ensure equivalent dietary supplement*. B. Wait 15 minutes for symptoms of hypoglycemia (i.e. nervousness, anxiety, If result is 71 mg/dl to 100 mg/dl: sweating, chills,  clamminess, irritability, confusion, tachycardia or dizziness). C. If patient asymptomatic, discharge patient. If patient symptomatic, repeat capillary blood glucose (CBG) and notify HBO physician. If result is 101 mg/dl to 249 mg/dl: A. Discharge patient. A. Notify HBO physician and await physician orders. B. It is recommended to do blood/urine If result is 250 mg/dl or greater: ketone testing. C. If the result meets the hospital definition of a critical result, follow hospital policy. *Juice or candies are NOT equivalent products. If patient refuses the Glucerna or Ensure, please consult the hospital dietitian for an appropriate substitute. Electronic Signature(s) Unsigned Entered By: Massie Kluver on 04/02/2022 09:22:08 Signature(s): Date(s): MARCELLES, CLINARD ([id number]) -------------------------------------------------------------------------------- Problem List Details Patient Name: KADEEM, HYLE. Date of Service: 04/02/2022 9:00 AM Medical Record Number: [id number] Patient Account Number: [id number] Date of Birth/Sex: 1960-07-16 (61 y.o. M) Treating RN: Cornell Barman Primary Care Provider: [name] Other Clinician: Massie Kluver Referring Provider: [name] Treating Provider/Extender: Skipper Cliche in Treatment: 9 Active Problems ICD-10 Encounter Code Description Active Date MDM Diagnosis [phone] Chronic multifocal osteomyelitis, left ankle and foot 01/29/2022 No Yes E11.621 Type 2 diabetes mellitus with foot ulcer 01/29/2022 No Yes L97.524 Non-pressure chronic ulcer of other part of left foot with necrosis of 01/29/2022 No Yes bone T81.31XA Disruption of external operation (surgical) wound, not elsewhere 01/29/2022 No Yes classified, initial encounter Inactive Problems Resolved Problems Electronic Signature(s) Signed: 04/02/2022 9:14:58 AM By: Worthy Keeler PA-C Entered By: Worthy Keeler on 04/02/2022 09:14:58

## 2022-04-02 NOTE — Progress Notes (Signed)
KOLSON, CHOVANEC ([id number]) Visit Report for 04/02/2022 HBO Details Patient Name: [name], [name]. Date of Service: 04/02/2022 10:00 AM Medical Record Number: [id number] Patient Account Number: [id number] Date of Birth/Sex: 17-Oct-1960 (61 y.o. M) Treating RN: Cornell Barman Primary Care Dalphine Cowie: [name] Other Clinician: [name] Referring Diego Ulbricht: [name] Treating Aarica Wax/Extender: Skipper Cliche in Treatment: 9 HBO Treatment Course Details Treatment Course Number: 1 Ordering Vyolet Sakuma: [name] Total Treatments Ordered: 40 HBO Treatment Start Date: 02/18/2022 HBO Indication: Chronic Refractory Osteomyelitis to Left Ankle and Foot HBO Treatment Details Treatment Number: 29 Patient Type: Outpatient Chamber Type: Monoplace Chamber Serial #: [id number] Treatment Protocol: 2.0 ATA with 90 minutes oxygen, and no air breaks Treatment Details Compression Rate Down: 1.5 psi / minute De-Compression Rate Up: 1.5 psi / minute Compress Tx Pressure Air breaks and breathing periods Decompress Decompress Begins Reached (leave unused spaces blank) Begins Ends Chamber Pressure (ATA) 1 2 - - - - - - 2 1 Clock Time (24 hr) 10:42 10:53 - - - - - - 12:24 12:34 Treatment Length: 112 (minutes) Treatment Segments: 4 Vital Signs Capillary Blood Glucose Reference Range: 80 - 120 mg / dl HBO Diabetic Blood Glucose Intervention Range: <131 mg/dl or >249 mg/dl Time Vitals Blood Respiratory Capillary Blood Glucose Pulse Action Type: Pulse: Temperature: Taken: Pressure: Rate: Glucose (mg/dl): Meter #: Oximetry (%) Taken: Pre 09:00 120/72 100 16 98 268 1 none per protocol Post 12:49 116/68 72 16 98.2 197 1 none per protocol Treatment Response Treatment Toleration: Well Treatment Completion Treatment Completed without Adverse Event Status: HBO Attestation I certify that I supervised this HBO treatment in accordance with Medicare guidelines. A trained emergency response team is  readily Yes available per hospital policies and procedures. Continue HBOT as ordered. Yes Electronic Signature(s) Signed: 04/02/2022 4:33:45 PM By: Worthy Keeler PA-C Previous Signature: 04/02/2022 1:17:21 PM Version By: [name] RCP, RRT, CHT Entered By: Worthy Keeler on 04/02/2022 16:33:45 [name] ([id number]) -------------------------------------------------------------------------------- HBO Safety Checklist Details Patient Name: BLU, [name]. Date of Service: 04/02/2022 10:00 AM Medical Record Number: [id number] Patient Account Number: [id number] Date of Birth/Sex: September 27, 1960 (62 y.o. M) Treating RN: Cornell Barman Primary Care Sueanne Maniaci: [name] Other Clinician: [name] Referring Breccan Galant: [name] Treating Fabian Walder/Extender: Skipper Cliche in Treatment: 9 HBO Safety Checklist Items Safety Checklist Consent Form Signed Patient voided / foley secured and emptied When did you last eato 07:00 am Last dose of injectable or oral agent 07:00 am Ostomy pouch emptied and vented if applicable NA All implantable devices assessed, documented and approved NA Intravenous access site secured and place NA Valuables secured Linens and cotton and cotton/polyester blend (less than 51% polyester) Personal oil-based products / skin lotions / body lotions removed Wigs or hairpieces removed NA Smoking or tobacco materials removed NA Books / newspapers / magazines / loose paper removed NA Cologne, aftershave, perfume and deodorant removed Jewelry removed (may wrap wedding band) Make-up removed NA Hair care products removed Battery operated devices (external) removed NA Heating patches and chemical warmers removed NA Titanium eyewear removed NA Nail polish cured greater than 10 hours NA Casting material cured greater than 10 hours NA Hearing aids removed NA Loose dentures or partials removed NA Prosthetics have been  removed NA Patient demonstrates correct use of air break device (if applicable) Patient concerns have been addressed Patient grounding bracelet on and cord attached to chamber Specifics for Inpatients (complete in addition to above) Medication sheet sent with patient Intravenous  medications needed or due during therapy sent with patient Drainage tubes (e.g. nasogastric tube or chest tube secured and vented) Endotracheal or Tracheotomy tube secured Cuff deflated of air and inflated with saline Airway suctioned Electronic Signature(s) Signed: 04/02/2022 1:17:21 PM By: [name] RCP, RRT, CHT Entered By: [name] on 04/02/2022 09:24:05

## 2022-04-03 ENCOUNTER — Encounter (HOSPITAL_BASED_OUTPATIENT_CLINIC_OR_DEPARTMENT_OTHER): Payer: 59 | Admitting: Internal Medicine

## 2022-04-03 DIAGNOSIS — M86372 Chronic multifocal osteomyelitis, left ankle and foot: Secondary | ICD-10-CM

## 2022-04-03 DIAGNOSIS — E11621 Type 2 diabetes mellitus with foot ulcer: Secondary | ICD-10-CM

## 2022-04-03 DIAGNOSIS — L97524 Non-pressure chronic ulcer of other part of left foot with necrosis of bone: Secondary | ICD-10-CM | POA: Diagnosis not present

## 2022-04-03 LAB — GLUCOSE, CAPILLARY
Glucose-Capillary: 212 mg/dL — ABNORMAL HIGH (ref 70–99)
Glucose-Capillary: 282 mg/dL — ABNORMAL HIGH (ref 70–99)

## 2022-04-03 NOTE — Progress Notes (Signed)
[name], [name] ([id number]) Visit Report for 04/02/2022 Arrival Information Details Patient Name: [name], [name]. Date of Service: 04/02/2022 9:00 AM Medical Record Number: [id number] Patient Account Number: [id number] Date of Birth/Sex: Feb 15, 1961 (61 y.o. M) Treating RN: Cornell Barman Primary Care Patriciaann Rabanal: [name] Other Clinician: Massie Kluver Referring Mikeria Valin: [name] Treating Yaniv Lage/Extender: Skipper Cliche in Treatment: 9 Visit Information History Since Last Visit All ordered tests and consults were completed: No Patient Arrived: Knee Scooter Added or deleted any medications: No Arrival Time: 09:02 Any new allergies or adverse reactions: No Transfer Assistance: None Had a fall or experienced change in No Patient Requires Transmission-Based Precautions: No activities of daily living that may affect Patient Has Alerts: No risk of falls: Hospitalized since last visit: No Pain Present Now: Yes Electronic Signature(s) Signed: 04/02/2022 5:15:02 PM By: Massie Kluver Entered By: Massie Kluver on 04/02/2022 09:02:51 [name] ([id number]) -------------------------------------------------------------------------------- Clinic Level of Care Assessment Details Patient Name: [name]. Date of Service: 04/02/2022 9:00 AM Medical Record Number: [id number] Patient Account Number: [id number] Date of Birth/Sex: 04-Apr-1961 (61 y.o. M) Treating RN: Cornell Barman Primary Care Kiarah Eckstein: [name] Other Clinician: Massie Kluver Referring Colinda Barth: [name] Treating Soniya Ashraf/Extender: Skipper Cliche in Treatment: 9 Clinic Level of Care Assessment Items TOOL 1 Quantity Score []  - Use when EandM and Procedure is performed on INITIAL visit 0 ASSESSMENTS - Nursing Assessment / Reassessment []  - General Physical Exam (combine w/ comprehensive assessment (listed just below) when performed on new 0 pt. evals) []  - 0 Comprehensive Assessment (HX,  ROS, Risk Assessments, Wounds Hx, etc.) ASSESSMENTS - Wound and Skin Assessment / Reassessment []  - Dermatologic / Skin Assessment (not related to wound area) 0 ASSESSMENTS - Ostomy and/or Continence Assessment and Care []  - Incontinence Assessment and Management 0 []  - 0 Ostomy Care Assessment and Management (repouching, etc.) PROCESS - Coordination of Care []  - Simple Patient / Family Education for ongoing care 0 []  - 0 Complex (extensive) Patient / Family Education for ongoing care []  - 0 Staff obtains Programmer, systems, Records, Test Results / Process Orders []  - 0 Staff telephones HHA, Nursing Homes / Clarify orders / etc []  - 0 Routine Transfer to another Facility (non-emergent condition) []  - 0 Routine Hospital Admission (non-emergent condition) []  - 0 New Admissions / Biomedical engineer / Ordering NPWT, Apligraf, etc. []  - 0 Emergency Hospital Admission (emergent condition) PROCESS - Special Needs []  - Pediatric / Minor Patient Management 0 []  - 0 Isolation Patient Management []  - 0 Hearing / Language / Visual special needs []  - 0 Assessment of Community assistance (transportation, D/C planning, etc.) []  - 0 Additional assistance / Altered mentation []  - 0 Support Surface(s) Assessment (bed, cushion, seat, etc.) INTERVENTIONS - Miscellaneous []  - External ear exam 0 []  - 0 Patient Transfer (multiple staff / Civil Service fast streamer / Similar devices) []  - 0 Simple Staple / Suture removal (25 or less) []  - 0 Complex Staple / Suture removal (26 or more) []  - 0 Hypo/Hyperglycemic Management (do not check if billed separately) []  - 0 Ankle / Brachial Index (ABI) - do not check if billed separately Has the patient been seen at the hospital within the last three years: Yes Total Score: 0 Level Of Care: ____ [name] ([id number]) Electronic Signature(s) Signed: 04/02/2022 5:15:02 PM By: Massie Kluver Entered By: Massie Kluver on 04/02/2022 37:34:28 JGOTL, XBWIOM B.  ([id number]) -------------------------------------------------------------------------------- Encounter Discharge Information Details Patient Name: [name], [name]. Date of Service: 04/02/2022 9:00 AM  Medical Record Number: [id number] Patient Account Number: [id number] Date of Birth/Sex: 01-17-1961 (61 y.o. M) Treating RN: Cornell Barman Primary Care Rhoderick Farrel: [name] Other Clinician: Massie Kluver Referring Zayonna Ayuso: [name] Treating Gaelen Brager/Extender: Skipper Cliche in Treatment: 9 Encounter Discharge Information Items Post Procedure Vitals Discharge Condition: Stable Temperature (F): 98.0 Ambulatory Status: Knee Scooter Pulse (bpm): 100 Discharge Destination: Home Respiratory Rate (breaths/min): 16 Transportation: Private Auto Blood Pressure (mmHg): 142/81 Accompanied By: self Schedule Follow-up Appointment: Yes Clinical Summary of Care: Electronic Signature(s) Signed: 04/02/2022 5:15:02 PM By: Massie Kluver Entered By: Massie Kluver on 04/02/2022 09:41:54 [name] ([id number]) -------------------------------------------------------------------------------- Lower Extremity Assessment Details Patient Name: [name]. Date of Service: 04/02/2022 9:00 AM Medical Record Number: [id number] Patient Account Number: [id number] Date of Birth/Sex: [birth year] (61 y.o. M) Treating RN: Cornell Barman Primary Care Oumou Smead: [name] Other Clinician: Massie Kluver Referring Lonnie Rosado: [name] Treating Sofia Vanmeter/Extender: Skipper Cliche in Treatment: 9 Electronic Signature(s) Signed: 04/02/2022 5:15:02 PM By: Massie Kluver Signed: 04/03/2022 9:40:21 AM By: Gretta Cool, BSN, RN, CWS, Kim RN, BSN Entered By: Massie Kluver on 04/02/2022 09:14:06 [name], [name] ([id number]) -------------------------------------------------------------------------------- Multi Wound Chart Details Patient Name: [name], [name]. Date of Service: 04/02/2022 9:00 AM Medical  Record Number: [id number] Patient Account Number: [id number] Date of Birth/Sex: 01-Jul-1961 (61 y.o. M) Treating RN: Cornell Barman Primary Care Jhace Fennell: [name] Other Clinician: Massie Kluver Referring Vianney Kopecky: [name] Treating Linda Biehn/Extender: Skipper Cliche in Treatment: 9 Vital Signs Height(in): 70 Pulse(bpm): 100 Weight(lbs): 255 Blood Pressure(mmHg): 142/81 Body Mass Index(BMI): 36.6 Temperature(F): 98.0 Respiratory Rate(breaths/min): 16 Photos: [N/A:N/A] Wound Location: Left, Lateral Foot N/A N/A Wounding Event: Blister N/A N/A Primary Etiology: Diabetic Wound/Ulcer of the Lower N/A N/A Extremity Secondary Etiology: Dehisced Wound N/A N/A Comorbid History: Hypertension, Type II Diabetes, N/A N/A Neuropathy Date Acquired: 10/29/2021 N/A N/A Weeks of Treatment: 9 N/A N/A Wound Status: Open N/A N/A Wound Recurrence: No N/A N/A Pending Amputation on Yes N/A N/A Presentation: Measurements L x W x D (cm) 4x4x1 N/A N/A Area (cm) : 12.566 N/A N/A Volume (cm) : 12.566 N/A N/A % Reduction in Area: -16.40% N/A N/A % Reduction in Volume: 53.50% N/A N/A Classification: Grade 3 N/A N/A Exudate Amount: Medium N/A N/A Exudate Type: Sanguinous N/A N/A Exudate Color: red N/A N/A Wound Margin: Epibole N/A N/A Granulation Amount: Medium (34-66%) N/A N/A Granulation Quality: Red, Pink N/A N/A Necrotic Amount: Medium (34-66%) N/A N/A Exposed Structures: Fat Layer (Subcutaneous Tissue): N/A N/A Yes Muscle: Yes Bone: Yes Fascia: No Tendon: No Joint: No Epithelialization: None N/A N/A Treatment Notes Electronic Signature(s) Signed: 04/02/2022 5:15:02 PM By: Willa Frater ([id number]) Entered By: Massie Kluver on 04/02/2022 09:14:28 [name] ([id number]) -------------------------------------------------------------------------------- [place] Details Patient Name: [name], [name]. Date of Service: 04/02/2022 9:00  AM Medical Record Number: [id number] Patient Account Number: [id number] Date of Birth/Sex: 11/19/1960 (61 y.o. M) Treating RN: Cornell Barman Primary Care Friend Dorfman: [name] Other Clinician: Massie Kluver Referring Tanyah Debruyne: [name] Treating Mace Weinberg/Extender: Skipper Cliche in Treatment: 9 Active Inactive HBO Nursing Diagnoses: Anxiety related to feelings of confinement associated with the hyperbaric oxygen chamber Anxiety related to knowledge deficit of hyperbaric oxygen therapy and treatment procedures Discomfort related to temperature and humidity changes inside hyperbaric chamber Potential for barotraumas to ears, sinuses, teeth, and lungs or cerebral gas embolism related to changes in atmospheric pressure inside hyperbaric oxygen chamber Potential for oxygen toxicity seizures related to delivery of 100% oxygen at an increased atmospheric pressure  Potential for pulmonary oxygen toxicity related to delivery of 100% oxygen at an increased atmospheric pressure Goals: Barotrauma will be prevented during HBO2 Date Initiated: 02/11/2022 Target Resolution Date: 02/11/2022 Goal Status: Active Patient and/or family will be able to state/discuss factors appropriate to the management of their disease process during treatment Date Initiated: 02/11/2022 Target Resolution Date: 02/11/2022 Goal Status: Active Patient will tolerate the hyperbaric oxygen therapy treatment Date Initiated: 02/11/2022 Target Resolution Date: 02/11/2022 Goal Status: Active Patient will tolerate the internal climate of the chamber Date Initiated: 02/11/2022 Target Resolution Date: 02/12/2022 Goal Status: Active Patient/caregiver will verbalize understanding of HBO goals, rationale, procedures and potential hazards Date Initiated: 02/11/2022 Target Resolution Date: 02/11/2022 Goal Status: Active Signs and symptoms of pulmonary oxygen toxicity will be recognized and promptly addressed Date Initiated:  02/11/2022 Target Resolution Date: 02/11/2022 Goal Status: Active Signs and symptoms of seizure will be recognized and promptly addressed ; seizing patients will suffer no harm Date Initiated: 02/11/2022 Target Resolution Date: 02/11/2022 Goal Status: Active Interventions: Administer a five (5) minute air break for patient if signs and symptoms of seizure appear and notify the hyperbaric physician Administer decongestants, per physician orders, prior to HBO2 Administer the correct therapeutic gas delivery based on the patients needs and limitations, per physician order Assess and provide for patientos comfort related to the hyperbaric environment and equalization of middle ear Assess for signs and symptoms related to adverse events, including but not limited to confinement anxiety, pneumothorax, oxygen toxicity and baurotrauma Assess patient for any history of confinement anxiety Assess patient's knowledge and expectations regarding hyperbaric medicine and provide education related to the hyperbaric environment, goals of treatment and prevention of adverse events Implement protocols to decrease risk of pneumothorax in high risk patients Notes: Necrotic Tissue Nursing Diagnoses: Impaired tissue integrity related to necrotic/devitalized tissue [name], [name] ([id number]) Knowledge deficit related to management of necrotic/devitalized tissue Goals: Necrotic/devitalized tissue will be minimized in the wound bed Date Initiated: 02/11/2022 Target Resolution Date: 02/12/2022 Goal Status: Active Patient/caregiver will verbalize understanding of reason and process for debridement of necrotic tissue Date Initiated: 02/11/2022 Target Resolution Date: 02/11/2022 Goal Status: Active Interventions: Assess patient pain level pre-, during and post procedure and prior to discharge Provide education on necrotic tissue and debridement process Treatment Activities: Apply topical anesthetic as ordered :  02/11/2022 Enzymatic debridement : 02/11/2022 Excisional debridement : 02/11/2022 Notes: Wound/Skin Impairment Nursing Diagnoses: Knowledge deficit related to ulceration/compromised skin integrity Goals: Patient/caregiver will verbalize understanding of skin care regimen Date Initiated: 01/29/2022 Target Resolution Date: 03/01/2022 Goal Status: Active Ulcer/skin breakdown will have a volume reduction of 30% by week 4 Date Initiated: 01/29/2022 Target Resolution Date: 03/01/2022 Goal Status: Active Ulcer/skin breakdown will have a volume reduction of 50% by week 8 Date Initiated: 01/29/2022 Target Resolution Date: 03/31/2022 Goal Status: Active Ulcer/skin breakdown will have a volume reduction of 80% by week 12 Date Initiated: 01/29/2022 Target Resolution Date: 05/01/2022 Goal Status: Active Ulcer/skin breakdown will heal within 14 weeks Date Initiated: 01/29/2022 Target Resolution Date: 05/01/2022 Goal Status: Active Interventions: Assess patient/caregiver ability to obtain necessary supplies Assess patient/caregiver ability to perform ulcer/skin care regimen upon admission and as needed Assess ulceration(s) every visit Notes: Electronic Signature(s) Signed: 04/02/2022 5:15:02 PM By: Massie Kluver Signed: 04/03/2022 9:40:21 AM By: Gretta Cool, BSN, RN, CWS, Kim RN, BSN Entered By: Massie Kluver on 04/02/2022 09:14:10 [name] ([id number]) -------------------------------------------------------------------------------- Pain Assessment Details Patient Name: [name], [name]. Date of Service: 04/02/2022 9:00 AM Medical Record Number: [id number] Patient Account Number:  [id number] Date of Birth/Sex: 21-Nov-1960 (61 y.o. M) Treating RN: Cornell Barman Primary Care Voshon Petro: [name] Other Clinician: Massie Kluver Referring Kimori Tartaglia: [name] Treating Yashika Mask/Extender: Skipper Cliche in Treatment: 9 Active Problems Location of Pain Severity and Description of Pain Patient Has Paino  Yes Site Locations Pain Location: Pain in Ulcers Duration of the Pain. Constant / Intermittento Intermittent Rate the pain. Current Pain Level: 4 Character of Pain Describe the Pain: Burning, Throbbing Pain Management and Medication Current Pain Management: Medication: Yes Rest: Yes Electronic Signature(s) Signed: 04/02/2022 5:15:02 PM By: Massie Kluver Signed: 04/03/2022 9:40:21 AM By: Gretta Cool, BSN, RN, CWS, Kim RN, BSN Entered By: Massie Kluver on 04/02/2022 09:11:07 [name] ([id number]) -------------------------------------------------------------------------------- Patient/Caregiver Education Details Patient Name: [name], [name]. Date of Service: 04/02/2022 9:00 AM Medical Record Number: [id number] Patient Account Number: [id number] Date of Birth/Gender: [birth year] (61 y.o. M) Treating RN: Cornell Barman Primary Care Physician: [name] Other Clinician: Massie Kluver Referring Physician: [name] Treating Physician/Extender: Skipper Cliche in Treatment: 9 Education Assessment Education Provided To: Patient Education Topics Provided Wound/Skin Impairment: Handouts: Other: continue wound care as directed Methods: Explain/Verbal Responses: State content correctly Electronic Signature(s) Signed: 04/02/2022 5:15:02 PM By: Massie Kluver Entered By: Massie Kluver on 04/02/2022 09:29:49 [name] ([id number]) -------------------------------------------------------------------------------- Wound Assessment Details Patient Name: [name]. Date of Service: 04/02/2022 9:00 AM Medical Record Number: [id number] Patient Account Number: [id number] Date of Birth/Sex: 09-06-1960 (61 y.o. M) Treating RN: Cornell Barman Primary Care Benjiman Sedgwick: [name] Other Clinician: Massie Kluver Referring Loukas Antonson: [name] Treating Tel Hevia/Extender: Skipper Cliche in Treatment: 9 Wound Status Wound Number: 14 Primary Etiology: Diabetic  Wound/Ulcer of the Lower Extremity Wound Location: Left, Lateral Foot Secondary Etiology: Dehisced Wound Wounding Event: Blister Wound Status: Open Date Acquired: 10/29/2021 Comorbid History: Hypertension, Type II Diabetes, Neuropathy Weeks Of Treatment: 9 Clustered Wound: No Pending Amputation On Presentation Photos Wound Measurements Length: (cm) 4 % Red Width: (cm) 4 % Red Depth: (cm) 1 Epith Area: (cm) 12.566 Tunn Volume: (cm) 12.566 Unde uction in Area: -16.4% uction in Volume: 53.5% elialization: None eling: No rmining: No Wound Description Classification: Grade 3 Foul Wound Margin: Epibole Sloug Exudate Amount: Medium Exudate Type: Sanguinous Exudate Color: red Odor After Cleansing: No h/Fibrino Yes Wound Bed Granulation Amount: Medium (34-66%) Exposed Structure Granulation Quality: Red, Pink Fascia Exposed: No Necrotic Amount: Medium (34-66%) Fat Layer (Subcutaneous Tissue) Exposed: Yes Necrotic Quality: Adherent Slough Tendon Exposed: No Muscle Exposed: Yes Necrosis of Muscle: No Joint Exposed: No Bone Exposed: Yes Treatment Notes Wound #14 (Foot) Wound Laterality: Left, Lateral Cleanser [place] Kit - [address] SHUN, PLETZ ([id number]) Discharge Instruction: Use supplies as instructed; Kit contains: (15) Saline Bullets; (15) 3x3 Gauze; 15 pr Gloves Soap and Water Discharge Instruction: Gently cleanse wound with antibacterial soap, rinse and pat dry prior to dressing wounds Wound Cleanser Discharge Instruction: Wash your hands with soap and water. Remove old dressing, discard into plastic bag and place into trash. Cleanse the wound with Wound Cleanser prior to applying a clean dressing using gauze sponges, not tissues or cotton balls. Do not scrub or use excessive force. Pat dry using gauze sponges, not tissue or cotton balls. Peri-Wound Care Topical Primary Dressing Hydrofera Blue Ready Transfer Foam, 4x5 (in/in) Discharge  Instruction: Apply Hydrofera Blue Ready to wound bed as directed Secondary Dressing ABD Pad 5x9 (in/in) Discharge Instruction: Cover with ABD pad Secured With Medipore Tape - 16M Medipore H Soft Cloth Surgical Tape, 2x2 (in/yd)  Kerlix Roll Sterile or Non-Sterile 6-ply 4.5x4 (yd/yd) Discharge Instruction: Apply Kerlix as directed Compression Wrap Compression Stockings Add-Ons Electronic Signature(s) Signed: 04/02/2022 5:15:02 PM By: Massie Kluver Signed: 04/03/2022 9:40:21 AM By: Gretta Cool, BSN, RN, CWS, Kim RN, BSN Entered By: Massie Kluver on 04/02/2022 09:13:02 [name] ([id number]) -------------------------------------------------------------------------------- Vitals Details Patient Name: [name], [name]. Date of Service: 04/02/2022 9:00 AM Medical Record Number: [id number] Patient Account Number: [id number] Date of Birth/Sex: 24-Jan-1961 (61 y.o. M) Treating RN: Cornell Barman Primary Care Aslin Farinas: [name] Other Clinician: Massie Kluver Referring Inella Kuwahara: [name] Treating Rosey Eide/Extender: Skipper Cliche in Treatment: 9 Vital Signs Time Taken: 09:06 Temperature (F): 98.0 Height (in): 70 Pulse (bpm): 100 Weight (lbs): 255 Respiratory Rate (breaths/min): 16 Body Mass Index (BMI): 36.6 Blood Pressure (mmHg): 142/81 Reference Range: 80 - 120 mg / dl Electronic Signature(s) Signed: 04/02/2022 5:15:02 PM By: Massie Kluver Entered By: Massie Kluver on 04/02/2022 09:06:27

## 2022-04-03 NOTE — Progress Notes (Signed)
BRAYAM, BOEKE ([id number]) Visit Report for 04/03/2022 Arrival Information Details Patient Name: [name], [name]. Date of Service: 04/03/2022 10:00 AM Medical Record Number: [id number] Patient Account Number: [id number] Date of Birth/Sex: [birth year] (61 y.o. M) Treating RN: Cornell Barman Primary Care Anes Rigel: [name] Other Clinician: [name] Referring Airiana Elman: [name] Treating Tequita Marrs/Extender: Yaakov Guthrie in Treatment: 9 Visit Information History Since Last Visit Added or deleted any medications: No Patient Arrived: Knee Scooter Any new allergies or adverse reactions: No Arrival Time: 09:55 Had a fall or experienced change in No Accompanied By: self activities of daily living that may affect Transfer Assistance: None risk of falls: Patient Identification Verified: Yes Signs or symptoms of abuse/neglect since last visito No Secondary Verification Process Completed: Yes Hospitalized since last visit: No Patient Requires Transmission-Based Precautions: No Implantable device outside of the clinic excluding No Patient Has Alerts: No cellular tissue based products placed in the center since last visit: Has Dressing in Place as Prescribed: Yes Has Footwear/Offloading in Place as Prescribed: Yes Left: Removable Cast Walker/Walking Boot Pain Present Now: No Electronic Signature(s) Signed: 04/03/2022 1:10:09 PM By: [name] RCP, RRT, CHT Entered By: [name] on 04/03/2022 10:36:52 [name] ([id number]) -------------------------------------------------------------------------------- Encounter Discharge Information Details Patient Name: [name]. Date of Service: 04/03/2022 10:00 AM Medical Record Number: [id number] Patient Account Number: [id number] Date of Birth/Sex: 08-23-1960 (61 y.o. M) Treating RN: Cornell Barman Primary Care Mang Hazelrigg: [name] Other Clinician: [name] Referring Estephania Licciardi:  [name] Treating Ademola Vert/Extender: Yaakov Guthrie in Treatment: 9 Encounter Discharge Information Items Discharge Condition: Stable Ambulatory Status: Knee Scooter Discharge Destination: Home Transportation: Private Auto Accompanied By: self Schedule Follow-up Appointment: Yes Clinical Summary of Care: Notes Patient has an HBO treatment scheduled on 04/04/22 at 10:00 am. Electronic Signature(s) Signed: 04/03/2022 1:10:09 PM By: [name] RCP, RRT, CHT Entered By: [name] on 04/03/2022 13:09:48 [name] ([id number]) -------------------------------------------------------------------------------- Vitals Details Patient Name: [name]. Date of Service: 04/03/2022 10:00 AM Medical Record Number: [id number] Patient Account Number: [id number] Date of Birth/Sex: 07-30-1960 (61 y.o. M) Treating RN: Cornell Barman Primary Care Lindalou Soltis: [name] Other Clinician: [name] Referring Petronella Shuford: [name] Treating Jasir Rother/Extender: Yaakov Guthrie in Treatment: 9 Vital Signs Time Taken: 10:01 Temperature (F): 97.7 Height (in): 70 Pulse (bpm): 78 Weight (lbs): 255 Respiratory Rate (breaths/min): 16 Body Mass Index (BMI): 36.6 Blood Pressure (mmHg): 128/62 Capillary Blood Glucose (mg/dl): 212 Reference Range: 80 - 120 mg / dl Electronic Signature(s) Signed: 04/03/2022 1:10:09 PM By: [name] RCP, RRT, CHT Entered By: [name] on 04/03/2022 10:44:11

## 2022-04-03 NOTE — Progress Notes (Signed)
IRWIN, TORAN ([id number]) Visit Report for 04/03/2022 HBO Details Patient Name: [name], [name]. Date of Service: 04/03/2022 10:00 AM Medical Record Number: [id number] Patient Account Number: [id number] Date of Birth/Sex: 1960/09/14 (61 y.o. M) Treating RN: Cornell Barman Primary Care Partick Musselman: [name] Other Clinician: [name] Referring Omran Keelin: [name] Treating Patton Swisher/Extender: Yaakov Guthrie in Treatment: 9 HBO Treatment Course Details Treatment Course Number: 1 Ordering Haidar Muse: [name] Total Treatments Ordered: 40 HBO Treatment Start Date: 02/18/2022 HBO Indication: Chronic Refractory Osteomyelitis to Left Ankle and Foot HBO Treatment Details Treatment Number: 30 Patient Type: Outpatient Chamber Type: Monoplace Chamber Serial #: [id number] Treatment Protocol: 2.0 ATA with 90 minutes oxygen, and no air breaks Treatment Details Compression Rate Down: 1.5 psi / minute De-Compression Rate Up: 1.5 psi / minute Compress Tx Pressure Air breaks and breathing periods Decompress Decompress Begins Reached (leave unused spaces blank) Begins Ends Chamber Pressure (ATA) 1 2 - - - - - - 2 1 Clock Time (24 hr) 10:31 10:43 - - - - - - 12:12 12:22 Treatment Length: 111 (minutes) Treatment Segments: 4 Vital Signs Capillary Blood Glucose Reference Range: 80 - 120 mg / dl HBO Diabetic Blood Glucose Intervention Range: <131 mg/dl or >249 mg/dl Time Vitals Blood Respiratory Capillary Blood Glucose Pulse Action Type: Pulse: Temperature: Taken: Pressure: Rate: Glucose (mg/dl): Meter #: Oximetry (%) Taken: Pre 10:01 128/62 78 16 97.7 212 1 none per protocol Post 12:34 112/64 78 16 97.7 282 1 none per protocol Treatment Response Treatment Toleration: Well Treatment Completion Treatment Completed without Adverse Event Status: HBO Attestation I certify that I supervised this HBO treatment in accordance with Medicare guidelines. A trained emergency response  team is readily Yes available per hospital policies and procedures. Continue HBOT as ordered. Yes Electronic Signature(s) Signed: 04/03/2022 2:44:12 PM By: Kalman Shan DO Previous Signature: 04/03/2022 1:10:09 PM Version By: [name] RCP, RRT, CHT Entered By: Kalman Shan on 04/03/2022 14:43:14 [name] ([id number]) -------------------------------------------------------------------------------- HBO Safety Checklist Details Patient Name: [name]. Date of Service: 04/03/2022 10:00 AM Medical Record Number: [id number] Patient Account Number: [id number] Date of Birth/Sex: [birth year] (61 y.o. M) Treating RN: Cornell Barman Primary Care Nathania Waldman: [name] Other Clinician: [name] Referring Edlyn Rosenburg: [name] Treating Yarel Rushlow/Extender: Yaakov Guthrie in Treatment: 9 HBO Safety Checklist Items Safety Checklist Consent Form Signed Patient voided / foley secured and emptied When did you last eato 07:00 am Last dose of injectable or oral agent 07:00 am Ostomy pouch emptied and vented if applicable NA All implantable devices assessed, documented and approved NA Intravenous access site secured and place NA Valuables secured Linens and cotton and cotton/polyester blend (less than 51% polyester) Personal oil-based products / skin lotions / body lotions removed Wigs or hairpieces removed NA Smoking or tobacco materials removed NA Books / newspapers / magazines / loose paper removed NA Cologne, aftershave, perfume and deodorant removed Jewelry removed (may wrap wedding band) Make-up removed NA Hair care products removed Battery operated devices (external) removed NA Heating patches and chemical warmers removed NA Titanium eyewear removed NA Nail polish cured greater than 10 hours NA Casting material cured greater than 10 hours NA Hearing aids removed NA Loose dentures or partials removed NA Prosthetics have been  removed NA Patient demonstrates correct use of air break device (if applicable) Patient concerns have been addressed Patient grounding bracelet on and cord attached to chamber Specifics for Inpatients (complete in addition to above) Medication sheet sent with patient Intravenous medications needed  or due during therapy sent with patient Drainage tubes (e.g. nasogastric tube or chest tube secured and vented) Endotracheal or Tracheotomy tube secured Cuff deflated of air and inflated with saline Airway suctioned Electronic Signature(s) Signed: 04/03/2022 1:10:09 PM By: [name] RCP, RRT, CHT Entered By: [name] on 04/03/2022 10:45:06

## 2022-04-04 ENCOUNTER — Encounter: Payer: 59 | Admitting: Physician Assistant

## 2022-04-04 DIAGNOSIS — E11621 Type 2 diabetes mellitus with foot ulcer: Secondary | ICD-10-CM | POA: Diagnosis not present

## 2022-04-04 LAB — GLUCOSE, CAPILLARY
Glucose-Capillary: 259 mg/dL — ABNORMAL HIGH (ref 70–99)
Glucose-Capillary: 240 mg/dL — ABNORMAL HIGH (ref 70–99)

## 2022-04-04 NOTE — Progress Notes (Signed)
FIORE, DETJEN ([id number]) Visit Report for 04/04/2022 Arrival Information Details Patient Name: [name], [name]. Date of Service: 04/04/2022 10:00 AM Medical Record Number: [id number] Patient Account Number: [id number] Date of Birth/Sex: 12-04-1960 (62 y.o. M) Treating RN: Cornell Barman Primary Care Yader Criger: [name] Other Clinician: [name] Referring Cleveland Yarbro: [name] Treating Afreen Siebels/Extender: Skipper Cliche in Treatment: 9 Visit Information History Since Last Visit Added or deleted any medications: No Patient Arrived: Knee Scooter Any new allergies or adverse reactions: No Arrival Time: 10:05 Had a fall or experienced change in No Accompanied By: self activities of daily living that may affect Transfer Assistance: None risk of falls: Patient Identification Verified: Yes Signs or symptoms of abuse/neglect since last visito No Secondary Verification Process Completed: Yes Hospitalized since last visit: No Patient Requires Transmission-Based Precautions: No Implantable device outside of the clinic excluding No Patient Has Alerts: No cellular tissue based products placed in the center since last visit: Has Dressing in Place as Prescribed: Yes Has Footwear/Offloading in Place as Prescribed: Yes Left: Removable Cast Walker/Walking Boot Pain Present Now: No Electronic Signature(s) Signed: 04/04/2022 1:17:45 PM By: [name] RCP, RRT, CHT Entered By: [name] on 04/04/2022 10:13:50 [name] ([id number]) -------------------------------------------------------------------------------- Encounter Discharge Information Details Patient Name: [name]. Date of Service: 04/04/2022 10:00 AM Medical Record Number: [id number] Patient Account Number: [id number] Date of Birth/Sex: [birth year] (62 y.o. M) Treating RN: Cornell Barman Primary Care Ronelle Michie: [name] Other Clinician: [name] Referring Kalina Morabito:  [name] Treating Faelynn Wynder/Extender: Skipper Cliche in Treatment: 9 Encounter Discharge Information Items Discharge Condition: Stable Ambulatory Status: Knee Scooter Discharge Destination: Home Transportation: Private Auto Accompanied By: self Schedule Follow-up Appointment: Yes Clinical Summary of Care: Notes Patient has an HBO treatment schleduled at 9:30 am on 04/05/22. Electronic Signature(s) Signed: 04/04/2022 1:17:45 PM By: [name] RCP, RRT, CHT Entered By: [name] on 04/04/2022 13:16:12 [name] ([id number]) -------------------------------------------------------------------------------- Vitals Details Patient Name: [name]. Date of Service: 04/04/2022 10:00 AM Medical Record Number: [id number] Patient Account Number: [id number] Date of Birth/Sex: March 31, 1961 (61 y.o. M) Treating RN: Cornell Barman Primary Care Truc Winfree: [name] Other Clinician: [name] Referring Khang Hannum: [name] Treating Ailynn Gow/Extender: Skipper Cliche in Treatment: 9 Vital Signs Time Taken: 10:05 Temperature (F): 98.0 Height (in): 70 Pulse (bpm): 66 Weight (lbs): 255 Respiratory Rate (breaths/min): 16 Body Mass Index (BMI): 36.6 Blood Pressure (mmHg): 122/68 Capillary Blood Glucose (mg/dl): 259 Reference Range: 80 - 120 mg / dl Electronic Signature(s) Signed: 04/04/2022 1:17:45 PM By: [name] RCP, RRT, CHT Entered By: [name] on 04/04/2022 10:14:20

## 2022-04-04 NOTE — Progress Notes (Signed)
GILL, DELROSSI ([id number]) Visit Report for 04/04/2022 HBO Details Patient Name: [name], SUDANO. Date of Service: 04/04/2022 10:00 AM Medical Record Number: [id number] Patient Account Number: [id number] Date of Birth/Sex: February 22, 1961 (61 y.o. M) Treating RN: Cornell Barman Primary Care Min Collymore: [name] Other Clinician: [name] Referring Tameyah Koch: [name] Treating Mayra Jolliffe/Extender: Skipper Cliche in Treatment: 9 HBO Treatment Course Details Treatment Course Number: 1 Ordering Aldred Mase: [name] Total Treatments Ordered: 40 HBO Treatment Start Date: 02/18/2022 HBO Indication: Chronic Refractory Osteomyelitis to Left Ankle and Foot HBO Treatment Details Treatment Number: 31 Patient Type: Outpatient Chamber Type: Monoplace Chamber Serial #: [id number] Treatment Protocol: 2.0 ATA with 90 minutes oxygen, and no air breaks Treatment Details Compression Rate Down: 1.5 psi / minute De-Compression Rate Up: 1.5 psi / minute Compress Tx Pressure Air breaks and breathing periods Decompress Decompress Begins Reached (leave unused spaces blank) Begins Ends Chamber Pressure (ATA) 1 2 - - - - - - 2 1 Clock Time (24 hr) 10:31 10:42 - - - - - - 12:12 12:22 Treatment Length: 111 (minutes) Treatment Segments: 4 Vital Signs Capillary Blood Glucose Reference Range: 80 - 120 mg / dl HBO Diabetic Blood Glucose Intervention Range: <131 mg/dl or >249 mg/dl Time Vitals Blood Respiratory Capillary Blood Glucose Pulse Action Type: Pulse: Temperature: Taken: Pressure: Rate: Glucose (mg/dl): Meter #: Oximetry (%) Taken: Pre 10:05 122/68 66 16 98 259 1 none per protocol Post 12:40 140/68 66 16 97.9 240 1 none per protocol Treatment Response Treatment Toleration: Well Treatment Completion Treatment Completed without Adverse Event Status: Electronic Signature(s) Signed: 04/04/2022 1:17:45 PM By: [name] RCP, RRT, CHT Signed: 04/04/2022 2:50:44 PM By: Worthy Keeler PA-C Entered By: [name] on 04/04/2022 13:15:09 [name] ([id number]) -------------------------------------------------------------------------------- HBO Safety Checklist Details Patient Name: [name]. Date of Service: 04/04/2022 10:00 AM Medical Record Number: [id number] Patient Account Number: [id number] Date of Birth/Sex: 24-Sep-1960 (61 y.o. M) Treating RN: Cornell Barman Primary Care Kema Santaella: [name] Other Clinician: [name] Referring Karielle Davidow: [name] Treating Karianne Nogueira/Extender: Skipper Cliche in Treatment: 9 HBO Safety Checklist Items Safety Checklist Consent Form Signed Patient voided / foley secured and emptied When did you last eato 07:00 am Last dose of injectable or oral agent 07:00 am Ostomy pouch emptied and vented if applicable NA All implantable devices assessed, documented and approved NA Intravenous access site secured and place NA Valuables secured Linens and cotton and cotton/polyester blend (less than 51% polyester) Personal oil-based products / skin lotions / body lotions removed Wigs or hairpieces removed NA Smoking or tobacco materials removed NA Books / newspapers / magazines / loose paper removed NA Cologne, aftershave, perfume and deodorant removed Jewelry removed (may wrap wedding band) Make-up removed NA Hair care products removed Battery operated devices (external) removed NA Heating patches and chemical warmers removed NA Titanium eyewear removed NA Nail polish cured greater than 10 hours NA Casting material cured greater than 10 hours NA Hearing aids removed NA Loose dentures or partials removed NA Prosthetics have been removed NA Patient demonstrates correct use of air break device (if applicable) Patient concerns have been addressed Patient grounding bracelet on and cord attached to chamber Specifics for Inpatients (complete in addition to above) Medication sheet  sent with patient Intravenous medications needed or due during therapy sent with patient Drainage tubes (e.g. nasogastric tube or chest tube secured and vented) Endotracheal or Tracheotomy tube secured Cuff deflated of air and inflated with saline Airway suctioned Electronic  Signature(s) Signed: 04/04/2022 1:17:45 PM By: [name] RCP, RRT, CHT Entered By: [name] on 04/04/2022 12:06:15

## 2022-04-05 ENCOUNTER — Encounter: Payer: 59 | Admitting: Physician Assistant

## 2022-04-05 DIAGNOSIS — E11621 Type 2 diabetes mellitus with foot ulcer: Secondary | ICD-10-CM | POA: Diagnosis not present

## 2022-04-05 LAB — GLUCOSE, CAPILLARY
Glucose-Capillary: 132 mg/dL — ABNORMAL HIGH (ref 70–99)
Glucose-Capillary: 203 mg/dL — ABNORMAL HIGH (ref 70–99)

## 2022-04-08 ENCOUNTER — Encounter: Payer: 59 | Admitting: Physician Assistant

## 2022-04-08 DIAGNOSIS — E11621 Type 2 diabetes mellitus with foot ulcer: Secondary | ICD-10-CM | POA: Diagnosis not present

## 2022-04-08 LAB — GLUCOSE, CAPILLARY
Glucose-Capillary: 138 mg/dL — ABNORMAL HIGH (ref 70–99)
Glucose-Capillary: 164 mg/dL — ABNORMAL HIGH (ref 70–99)

## 2022-04-08 NOTE — Progress Notes (Signed)
HALEEM, HANNER ([id number]) Visit Report for 04/08/2022 Arrival Information Details Patient Name: [name], [name]. Date of Service: 04/08/2022 8:30 AM Medical Record Number: [id number] Patient Account Number: [id number] Date of Birth/Sex: 1960/10/17 (61 y.o. M) Treating RN: Cornell Barman Primary Care Monti Jilek: [name] Other Clinician: Referring Grettel Rames: [name] Treating Peachie Barkalow/Extender: Skipper Cliche in Treatment: 9 Visit Information History Since Last Visit Added or deleted any medications: No Patient Arrived: Knee Scooter Has Dressing in Place as Prescribed: Yes Arrival Time: 08:43 Has Footwear/Offloading in Place as Prescribed: Yes Accompanied By: self Left: Removable Cast Walker/Walking Transfer Assistance: None Boot Patient Identification Verified: Yes Pain Present Now: No Secondary Verification Process Completed: Yes Patient Requires Transmission-Based Precautions: No Patient Has Alerts: No Electronic Signature(s) Signed: 04/08/2022 6:29:15 PM By: Gretta Cool, BSN, RN, CWS, Kim RN, BSN Entered By: Gretta Cool, BSN, RN, CWS, Kim on 04/08/2022 08:44:54 [name] ([id number]) -------------------------------------------------------------------------------- Clinic Level of Care Assessment Details Patient Name: [name], [name]. Date of Service: 04/08/2022 8:30 AM Medical Record Number: [id number] Patient Account Number: [id number] Date of Birth/Sex: August 19, 1960 (61 y.o. M) Treating RN: Cornell Barman Primary Care Prayan Ulin: [name] Other Clinician: Referring Dreyah Montrose: [name] Treating Zya Finkle/Extender: Skipper Cliche in Treatment: 9 Clinic Level of Care Assessment Items TOOL 4 Quantity Score '[]'$  - Use when only an EandM is performed on FOLLOW-UP visit 0 ASSESSMENTS - Nursing Assessment / Reassessment X - Reassessment of Co-morbidities (includes updates in patient status) 1 10 X- 1 5 Reassessment of Adherence to Treatment Plan ASSESSMENTS - Wound and  Skin Assessment / Reassessment X - Simple Wound Assessment / Reassessment - one wound 1 5 '[]'$  - 0 Complex Wound Assessment / Reassessment - multiple wounds '[]'$  - 0 Dermatologic / Skin Assessment (not related to wound area) ASSESSMENTS - Focused Assessment '[]'$  - Circumferential Edema Measurements - multi extremities 0 '[]'$  - 0 Nutritional Assessment / Counseling / Intervention '[]'$  - 0 Lower Extremity Assessment (monofilament, tuning fork, pulses) '[]'$  - 0 Peripheral Arterial Disease Assessment (using hand held doppler) ASSESSMENTS - Ostomy and/or Continence Assessment and Care '[]'$  - Incontinence Assessment and Management 0 '[]'$  - 0 Ostomy Care Assessment and Management (repouching, etc.) PROCESS - Coordination of Care X - Simple Patient / Family Education for ongoing care 1 15 '[]'$  - 0 Complex (extensive) Patient / Family Education for ongoing care X- 1 10 Staff obtains Programmer, systems, Records, Test Results / Process Orders '[]'$  - 0 Staff telephones HHA, Nursing Homes / Clarify orders / etc '[]'$  - 0 Routine Transfer to another Facility (non-emergent condition) '[]'$  - 0 Routine Hospital Admission (non-emergent condition) '[]'$  - 0 New Admissions / Biomedical engineer / Ordering NPWT, Apligraf, etc. '[]'$  - 0 Emergency Hospital Admission (emergent condition) X- 1 10 Simple Discharge Coordination '[]'$  - 0 Complex (extensive) Discharge Coordination PROCESS - Special Needs '[]'$  - Pediatric / Minor Patient Management 0 '[]'$  - 0 Isolation Patient Management '[]'$  - 0 Hearing / Language / Visual special needs '[]'$  - 0 Assessment of Community assistance (transportation, D/C planning, etc.) '[]'$  - 0 Additional assistance / Altered mentation '[]'$  - 0 Support Surface(s) Assessment (bed, cushion, seat, etc.) INTERVENTIONS - Wound Cleansing / Measurement [name], [name]. ([id number]) X- 1 5 Simple Wound Cleansing - one wound '[]'$  - 0 Complex Wound Cleansing - multiple wounds X- 1 5 Wound Imaging (photographs - any  number of wounds) '[]'$  - 0 Wound Tracing (instead of photographs) X- 1 5 Simple Wound Measurement - one wound '[]'$  - 0 Complex Wound Measurement - multiple  wounds INTERVENTIONS - Wound Dressings '[]'$  - Small Wound Dressing one or multiple wounds 0 '[]'$  - 0 Medium Wound Dressing one or multiple wounds X- 1 20 Large Wound Dressing one or multiple wounds '[]'$  - 0 Application of Medications - topical '[]'$  - 0 Application of Medications - injection INTERVENTIONS - Miscellaneous '[]'$  - External ear exam 0 '[]'$  - 0 Specimen Collection (cultures, biopsies, blood, body fluids, etc.) '[]'$  - 0 Specimen(s) / Culture(s) sent or taken to Lab for analysis '[]'$  - 0 Patient Transfer (multiple staff / Civil Service fast streamer / Similar devices) '[]'$  - 0 Simple Staple / Suture removal (25 or less) '[]'$  - 0 Complex Staple / Suture removal (26 or more) '[]'$  - 0 Hypo / Hyperglycemic Management (close monitor of Blood Glucose) '[]'$  - 0 Ankle / Brachial Index (ABI) - do not check if billed separately X- 1 5 Vital Signs Has the patient been seen at the hospital within the last three years: Yes Total Score: 95 Level Of Care: New/Established - Level 3 Electronic Signature(s) Signed: 04/08/2022 6:29:15 PM By: Gretta Cool, BSN, RN, CWS, Kim RN, BSN Entered By: Gretta Cool, BSN, RN, CWS, Kim on 04/08/2022 09:16:43 [name] ([id number]) -------------------------------------------------------------------------------- Encounter Discharge Information Details Patient Name: [name], [name]. Date of Service: 04/08/2022 8:30 AM Medical Record Number: [id number] Patient Account Number: [id number] Date of Birth/Sex: Dec 22, 1960 (61 y.o. M) Treating RN: Cornell Barman Primary Care Makari Sanko: [name] Other Clinician: Referring Satoshi Kalas: [name] Treating Arleigh Odowd/Extender: Skipper Cliche in Treatment: 9 Encounter Discharge Information Items Discharge Condition: Stable Ambulatory Status: Knee Scooter Schedule Follow-up Appointment:  Yes Clinical Summary of Care: Notes Patient went into HBOT treatment room for therapy. Electronic Signature(s) Signed: 04/08/2022 6:29:15 PM By: Gretta Cool, BSN, RN, CWS, Kim RN, BSN Entered By: Gretta Cool, BSN, RN, CWS, Kim on 04/08/2022 09:19:01 [name] ([id number]) -------------------------------------------------------------------------------- Lower Extremity Assessment Details Patient Name: [name], [name]. Date of Service: 04/08/2022 8:30 AM Medical Record Number: [id number] Patient Account Number: [id number] Date of Birth/Sex: 1961-03-08 (61 y.o. M) Treating RN: Cornell Barman Primary Care Taurean Ju: [name] Other Clinician: Referring Deandra Gadson: [name] Treating Savannaha Stonerock/Extender: Skipper Cliche in Treatment: 9 Edema Assessment Assessed: Shirlyn Goltz: Yes] [name] [place]: No] [Left: Edema] [Right: :] Vascular Assessment Pulses: Dorsalis Pedis Palpable: [Left:Yes] Electronic Signature(s) Signed: 04/08/2022 6:29:15 PM By: Gretta Cool, BSN, RN, CWS, Kim RN, BSN Entered By: Gretta Cool, BSN, RN, CWS, Kim on 04/08/2022 09:14:24 [name] ([id number]) -------------------------------------------------------------------------------- Multi Wound Chart Details Patient Name: [name], [name]. Date of Service: 04/08/2022 8:30 AM Medical Record Number: [id number] Patient Account Number: [id number] Date of Birth/Sex: 07-11-1960 (61 y.o. 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/A N/A Extremity Secondary Etiology: Dehisced Wound N/A  N/A Comorbid History: Hypertension, Type II Diabetes, N/A N/A Neuropathy Date Acquired: 10/29/2021 N/A N/A Weeks of Treatment: 9 N/A N/A Wound Status: Open N/A N/A Wound Recurrence: No N/A N/A Pending Amputation on Yes N/A N/A Presentation: Measurements L x W x D (cm) 4.5x2.5x1 N/A N/A Area (cm) : 8.836 N/A N/A Volume (cm) : 8.836 N/A N/A % Reduction in Area: 18.20% N/A N/A % Reduction in Volume: 67.30% N/A N/A Classification: Grade 3 N/A N/A Exudate Amount: Medium  N/A N/A Exudate Type: Sanguinous N/A N/A Exudate Color: red N/A N/A Wound Margin: Epibole N/A N/A Granulation Amount: Large (67-100%) N/A N/A Granulation Quality: Red, Hyper-granulation, Friable N/A N/A Necrotic Amount: Small (1-33%) N/A N/A Exposed Structures: Fat Layer (Subcutaneous Tissue): N/A N/A Yes Muscle: Yes Bone: Yes Fascia: No Tendon: No Joint: No Epithelialization: Small (1-33%) N/A N/A Assessment Notes: Callus surrounding wound. N/A N/A Treatment Notes Electronic Signature(s) Signed: 04/08/2022 6:29:15 PM By: Gretta Cool, BSN, RN, CWS, Kim RN, BSN [address] [place] ([id number]) Entered By: Gretta Cool, BSN, RN, CWS, Kim on 04/08/2022 09:01:13 [name] ([id number]) -------------------------------------------------------------------------------- [place] Details Patient Name: [name], [name]. Date of Service: 04/08/2022 8:30 AM Medical Record Number: [id number] Patient Account Number: [id number] Date of Birth/Sex: [birth year] (61 y.o. M) Treating RN: Cornell Barman Primary Care Jaton Eilers: [name] Other Clinician: Referring Mitsuko Luera: [name] Treating Banner Huckaba/Extender: Skipper Cliche in Treatment: 9 Active Inactive HBO Nursing Diagnoses: Anxiety related to feelings of confinement associated with the hyperbaric oxygen chamber Anxiety related to knowledge deficit of hyperbaric oxygen therapy and treatment procedures Discomfort related to temperature and humidity changes  inside hyperbaric chamber Potential for barotraumas to ears, sinuses, teeth, and lungs or cerebral gas embolism related to changes in atmospheric pressure inside hyperbaric oxygen chamber Potential for oxygen toxicity seizures related to delivery of 100% oxygen at an increased atmospheric pressure Potential for pulmonary oxygen toxicity related to delivery of 100% oxygen at an increased atmospheric pressure Goals: Barotrauma will be prevented during HBO2 Date Initiated: 02/11/2022 Target Resolution Date: 02/11/2022 Goal Status: Active Patient and/or family will be able to state/discuss factors appropriate to the management of their disease process during treatment Date Initiated: 02/11/2022 Target Resolution Date: 02/11/2022 Goal Status: Active Patient will tolerate the hyperbaric oxygen therapy treatment Date Initiated: 02/11/2022 Target Resolution Date: 02/11/2022 Goal Status: Active Patient will tolerate the internal climate of the chamber Date Initiated: 02/11/2022 Target Resolution Date: 02/12/2022 Goal Status: Active Patient/caregiver will verbalize understanding of HBO goals, rationale, procedures and potential hazards Date Initiated: 02/11/2022 Target Resolution Date: 02/11/2022 Goal Status: Active Signs and symptoms of pulmonary oxygen toxicity will be recognized and promptly addressed Date Initiated: 02/11/2022 Target Resolution Date: 02/11/2022 Goal Status: Active Signs and symptoms of seizure will be recognized and promptly addressed ; seizing patients will suffer no harm Date Initiated: 02/11/2022 Target Resolution Date: 02/11/2022 Goal Status: Active Interventions: Administer a five (5) minute air break for patient if signs and symptoms of seizure appear and notify the hyperbaric physician Administer decongestants, per physician orders, prior to HBO2 Administer the correct therapeutic gas delivery based on the patients needs and limitations, per physician order Assess and  provide for patientos comfort related to the hyperbaric environment and equalization of middle ear Assess for signs and symptoms related to adverse events, including but not limited to confinement anxiety, pneumothorax, oxygen toxicity and baurotrauma Assess patient for any history of confinement anxiety Assess patient's knowledge and expectations regarding hyperbaric medicine and provide education related to the hyperbaric environment, goals of treatment and prevention of adverse events Implement protocols to decrease risk of pneumothorax in high risk patients Notes: Necrotic Tissue Nursing Diagnoses: Impaired tissue integrity related to necrotic/devitalized tissue [name], [name] ([id number]) Knowledge deficit related to management of necrotic/devitalized tissue Goals: Necrotic/devitalized tissue will be minimized in the wound bed Date Initiated: 02/11/2022 Target Resolution Date: 02/12/2022 Goal Status: Active Patient/caregiver will verbalize understanding of reason and process for debridement of necrotic tissue Date Initiated: 02/11/2022 Target Resolution Date: 02/11/2022 Goal Status: Active  Interventions: Assess patient pain level pre-, during and post procedure and prior to discharge Provide education on necrotic tissue and debridement process Treatment Activities: Apply topical anesthetic as ordered : 02/11/2022 Enzymatic debridement : 02/11/2022 Excisional debridement : 02/11/2022 Notes: Wound/Skin Impairment Nursing Diagnoses: Knowledge deficit related to ulceration/compromised skin integrity Goals: Patient/caregiver will verbalize understanding of skin care regimen Date Initiated: 01/29/2022 Target Resolution Date: 03/01/2022 Goal Status: Active Ulcer/skin breakdown will have a volume reduction of 30% by week 4 Date Initiated: 01/29/2022 Target Resolution Date: 03/01/2022 Goal Status: Active Ulcer/skin breakdown will have a volume reduction of 50% by week 8 Date Initiated:  01/29/2022 Target Resolution Date: 03/31/2022 Goal Status: Active Ulcer/skin breakdown will have a volume reduction of 80% by week 12 Date Initiated: 01/29/2022 Target Resolution Date: 05/01/2022 Goal Status: Active Ulcer/skin breakdown will heal within 14 weeks Date Initiated: 01/29/2022 Target Resolution Date: 05/01/2022 Goal Status: Active Interventions: Assess patient/caregiver ability to obtain necessary supplies Assess patient/caregiver ability to perform ulcer/skin care regimen upon admission and as needed Assess ulceration(s) every visit Notes: Electronic Signature(s) Signed: 04/08/2022 6:29:15 PM By: Gretta Cool, BSN, RN, CWS, Kim RN, BSN Entered By: Gretta Cool, BSN, RN, CWS, Kim on 04/08/2022 09:00:54 [name] ([id number]) -------------------------------------------------------------------------------- Pain Assessment Details Patient Name: [name], [name]. Date of Service: 04/08/2022 8:30 AM Medical Record Number: [id number] Patient Account Number: [id number] Date of Birth/Sex: 08-28-1960 (61 y.o. M) Treating RN: Cornell Barman Primary Care Samira Acero: [name] Other Clinician: Referring Yovanny Coats: [name] Treating Caulin Begley/Extender: Skipper Cliche in Treatment: 9 Active Problems Location of Pain Severity and Description of Pain Patient Has Paino Yes Site Locations Pain Location: Generalized Pain Rate the pain. Current Pain Level: 4 Character of Pain Describe the Pain: Tender Pain Management and Medication Current Pain Management: Electronic Signature(s) Signed: 04/08/2022 6:29:15 PM By: Gretta Cool, BSN, RN, CWS, Kim RN, BSN Entered By: Gretta Cool, BSN, RN, CWS, Kim on 04/08/2022 09:13:19 [name] ([id number]) -------------------------------------------------------------------------------- Patient/Caregiver Education Details Patient Name: [name]. Date of Service: 04/08/2022 8:30 AM Medical Record Number: [id number] Patient Account Number: [id number] Date of  Birth/Gender: 10-23-1960 (61 y.o. M) Treating RN: Cornell Barman Primary Care Physician: [name] Other Clinician: Referring Physician: [name] Treating Physician/Extender: Skipper Cliche in Treatment: 9 Education Assessment Education Provided To: Patient Education Topics Provided Offloading: Handouts: Other: continue to use knee scooter. Wound/Skin Impairment: Handouts: Caring for Your Ulcer, Other: continue wound care as prescribed Electronic Signature(s) Signed: 04/08/2022 6:29:15 PM By: Gretta Cool, BSN, RN, CWS, Kim RN, BSN Entered By: Gretta Cool, BSN, RN, CWS, Kim on 04/08/2022 09:17:40 [name] ([id number]) -------------------------------------------------------------------------------- Wound Assessment Details Patient Name: [name], [name]. Date of Service: 04/08/2022 8:30 AM Medical Record Number: [id number] Patient Account Number: [id number] Date of Birth/Sex: 03-31-1961 (61 y.o. M) Treating RN: Cornell Barman Primary Care Chanceler Pullin: [name] Other Clinician: Referring Jesyka Slaght: [name] Treating Modell Fendrick/Extender: Skipper Cliche in Treatment: 9 Wound Status Wound Number: 14 Primary Etiology: Diabetic Wound/Ulcer of the Lower Extremity Wound Location: Left, Lateral Foot Secondary Etiology: Dehisced Wound Wounding Event: Blister Wound Status: Open Date Acquired: 10/29/2021 Comorbid History: Hypertension, Type II Diabetes, Neuropathy Weeks Of Treatment: 9 Clustered Wound: No Pending Amputation On Presentation Photos Wound Measurements Length: (cm) 4.5 % Width: (cm) 2.5 % Depth: (cm) 1 E Area: (cm) 8.836 Volume: (cm) 8.836 Reduction in Area: 18.2% Reduction in Volume: 67.3% pithelialization: Small (1-33%) Tunneling: No Undermining: No Wound Description Classification: Grade 3 F Wound Margin: Epibole S Exudate Amount: Medium Exudate Type: Sanguinous Exudate Color: red oul  Odor After Cleansing: No lough/Fibrino Yes Wound  Bed Granulation Amount: Large (67-100%) Exposed Structure Granulation Quality: Red, Hyper-granulation, Friable Fascia Exposed: No Necrotic Amount: Small (1-33%) Fat Layer (Subcutaneous Tissue) Exposed: Yes Necrotic Quality: Adherent Slough Tendon Exposed: No Muscle Exposed: Yes Necrosis of Muscle: No Joint Exposed: No Bone Exposed: Yes Assessment Notes Callus surrounding wound. Treatment Notes Wound #14 (Foot) Wound Laterality: Left, Lateral STEPHANIE, MCGLONE ([id number]) Cleanser Wound Cleanser Discharge Instruction: Wash your hands with soap and water. Remove old dressing, discard into plastic bag and place into trash. Cleanse the wound with Wound Cleanser prior to applying a clean dressing using gauze sponges, not tissues or cotton balls. Do not scrub or use excessive force. Pat dry using gauze sponges, not tissue or cotton balls. Peri-Wound Care Topical Primary Dressing Hydrofera Blue Ready Transfer Foam, 4x5 (in/in) Discharge Instruction: Apply Hydrofera Blue Ready to wound bed as directed Secondary Dressing ABD Pad 5x9 (in/in) Discharge Instruction: Cover with ABD pad Secured With [place] H Soft Cloth Surgical Tape, 2x2 (in/yd) Kerlix Roll Sterile or Non-Sterile 6-ply 4.5x4 (yd/yd) Discharge Instruction: Apply Kerlix as directed Compression Wrap Compression Stockings Add-Ons Electronic Signature(s) Signed: 04/08/2022 6:29:15 PM By: Gretta Cool, BSN, RN, CWS, Kim RN, BSN Entered By: Gretta Cool, BSN, RN, CWS, Kim on 04/08/2022 08:53:56 [name] ([id number]) -------------------------------------------------------------------------------- [place] Details Patient Name: [name]. Date of Service: 04/08/2022 8:30 AM Medical Record Number: [id number] Patient Account Number: [id number] Date of Birth/Sex: 04-Nov-1960 (61 y.o. M) Treating RN: Cornell Barman Primary Care Ridgely Anastacio: [name] Other Clinician: Referring Sansa Alkema: [name] Treating  Shonta Bourque/Extender: Skipper Cliche in Treatment: 9 Vital Signs Time Taken: 08:44 Temperature (F): 97.9 Height (in): 70 Pulse (bpm): 98 Weight (lbs): 255 Respiratory Rate (breaths/min): 16 Body Mass Index (BMI): 36.6 Blood Pressure (mmHg): 152/86 Reference Range: 80 - 120 mg / dl Electronic Signature(s) Signed: 04/08/2022 6:29:15 PM By: Gretta Cool, BSN, RN, CWS, Kim RN, BSN Entered By: Gretta Cool, BSN, RN, CWS, Kim on 04/08/2022 08:45:11

## 2022-04-08 NOTE — Progress Notes (Signed)
[name] ([id number]) Visit Report for 04/08/2022 Arrival Information Details Patient Name: [name]. Date of Service: 04/08/2022 10:00 AM Medical Record Number: [id number] Patient Account Number: [id number] Date of Birth/Sex: Feb 02, 1961 (61 y.o. M) Treating RN: Cornell Barman Primary Care Leaman Abe: [name] Other Clinician: [name] Referring Shawndrea Rutkowski: [name] Treating Milianna Ericsson/Extender: Skipper Cliche in Treatment: 9 Visit Information History Since Last Visit Added or deleted any medications: No Patient Arrived: Knee Scooter Any new allergies or adverse reactions: No Arrival Time: 09:10 Had a fall or experienced change in No Accompanied By: self activities of daily living that may affect Transfer Assistance: None risk of falls: Patient Identification Verified: Yes Signs or symptoms of abuse/neglect since last visito No Secondary Verification Process Completed: Yes Implantable device outside of the clinic excluding No Patient Requires Transmission-Based Precautions: No cellular tissue based products placed in the center Patient Has Alerts: No since last visit: Has Dressing in Place as Prescribed: Yes Has Footwear/Offloading in Place as Prescribed: Yes Left: Removable Cast Walker/Walking Boot Pain Present Now: No Electronic Signature(s) Signed: 04/08/2022 11:44:15 AM By: [name] RCP, RRT, CHT Entered By: [name] on 04/08/2022 11:06:12 [name] ([id number]) -------------------------------------------------------------------------------- Encounter Discharge Information Details Patient Name: [name]. Date of Service: 04/08/2022 10:00 AM Medical Record Number: [id number] Patient Account Number: [id number] Date of Birth/Sex: [birth year] (61 y.o. M) Treating RN: Cornell Barman Primary Care Gaytha Raybourn: [name] Other Clinician: [name] Referring Airi Copado: [name] Treating  Kaliope Quinonez/Extender: Skipper Cliche in Treatment: 9 Encounter Discharge Information Items Discharge Condition: Stable Ambulatory Status: Knee Scooter Discharge Destination: Home Transportation: Private Auto Accompanied By: self Schedule Follow-up Appointment: Yes Clinical Summary of Care: Notes Patient has an HBO treatment scheduled on 04/09/22 at 08:00 am. Electronic Signature(s) Signed: 04/08/2022 11:44:15 AM By: [name] RCP, RRT, CHT Entered By: [name] on 04/08/2022 11:43:56 [name] ([id number]) -------------------------------------------------------------------------------- [place] Details Patient Name: [name]. Date of Service: 04/08/2022 10:00 AM Medical Record Number: [id number] Patient Account Number: [id number] Date of Birth/Sex: 05-24-1961 (61 y.o. M) Treating RN: Cornell Barman Primary Care Raedyn Wenke: [name] Other Clinician: [name] Referring Jullian Clayson: [name] Treating Suhaan Perleberg/Extender: Skipper Cliche in Treatment: 9 Vital Signs Time Taken: 09:14 Temperature (F): 98.3 Height (in): 70 Pulse (bpm): 72 Weight (lbs): 255 Respiratory Rate (breaths/min): 16 Body Mass Index (BMI): 36.6 Blood Pressure (mmHg): 122/62 Capillary Blood Glucose (mg/dl): 138 Reference Range: 80 - 120 mg / dl Electronic Signature(s) Signed: 04/08/2022 11:44:15 AM By: [name] RCP, RRT, CHT Entered By: [name] on 04/08/2022 11:06:55

## 2022-04-08 NOTE — Progress Notes (Signed)
[name], [name] ([id number]) Visit Report for 04/05/2022 Arrival Information Details Patient Name: [name], DAHM. Date of Service: 04/05/2022 10:00 AM Medical Record Number: [id number] Patient Account Number: [id number] Date of Birth/Sex: [birth year] (61 y.o. M) Treating RN: Cornell Barman Primary Care Jaylin Roundy: [name] Other Clinician: [name] Referring Ambriel Gorelick: [name] Treating Lennis Korb/Extender: Skipper Cliche in Treatment: 9 Visit Information History Since Last Visit Added or deleted any medications: No Patient Arrived: Knee Scooter Any new allergies or adverse reactions: No Arrival Time: 09:35 Had a fall or experienced change in No Accompanied By: self activities of daily living that may affect Transfer Assistance: None risk of falls: Patient Identification Verified: Yes Signs or symptoms of abuse/neglect since last visito No Secondary Verification Process Completed: Yes Implantable device outside of the clinic excluding No Patient Requires Transmission-Based Precautions: No cellular tissue based products placed in the center Patient Has Alerts: No since last visit: Has Dressing in Place as Prescribed: Yes Has Footwear/Offloading in Place as Prescribed: Yes Left: Removable Cast Walker/Walking Boot Pain Present Now: No Electronic Signature(s) Signed: 04/08/2022 11:00:16 AM By: [name] RCP, RRT, CHT Entered By: [name] on 04/05/2022 10:52:11 [name] ([id number]) -------------------------------------------------------------------------------- Encounter Discharge Information Details Patient Name: [name]. Date of Service: 04/05/2022 10:00 AM Medical Record Number: [id number] Patient Account Number: [id number] Date of Birth/Sex: August 11, 1960 (61 y.o. M) Treating RN: Cornell Barman Primary Care Endy Easterly: [name] Other Clinician: [name] Referring Belladonna Lubinski: [name] Treating  Jabre Heo/Extender: Skipper Cliche in Treatment: 9 Encounter Discharge Information Items Discharge Condition: Stable Ambulatory Status: Knee Scooter Discharge Destination: Home Transportation: Private Auto Accompanied By: self Schedule Follow-up Appointment: Yes Clinical Summary of Care: Notes Patient has an HBO treatment scheduled on 04/08/22 at 08:00 am. Electronic Signature(s) Signed: 04/08/2022 11:00:16 AM By: [name] RCP, RRT, CHT Entered By: [name] on 04/05/2022 13:02:28 [name] ([id number]) -------------------------------------------------------------------------------- [place] Details Patient Name: [name]. Date of Service: 04/05/2022 10:00 AM Medical Record Number: [id number] Patient Account Number: [id number] Date of Birth/Sex: 26-Feb-1961 (61 y.o. M) Treating RN: Cornell Barman Primary Care Savera Donson: [name] Other Clinician: [name] Referring Torre Schaumburg: [name] Treating Ailie Gage/Extender: Skipper Cliche in Treatment: 9 Vital Signs Time Taken: 09:29 Temperature (F): 97.7 Height (in): 70 Pulse (bpm): 78 Weight (lbs): 255 Respiratory Rate (breaths/min): 16 Body Mass Index (BMI): 36.6 Blood Pressure (mmHg): 128/64 Capillary Blood Glucose (mg/dl): 132 Reference Range: 80 - 120 mg / dl Electronic Signature(s) Signed: 04/08/2022 11:00:16 AM By: [name] RCP, RRT, CHT Entered By: [name] on 04/05/2022 10:54:01

## 2022-04-08 NOTE — Progress Notes (Signed)
CARLUS, STAY ([id number]) Visit Report for 04/05/2022 HBO Details Patient Name: [name], [name]. Date of Service: 04/05/2022 10:00 AM Medical Record Number: [id number] Patient Account Number: [id number] Date of Birth/Sex: 1960/08/11 (61 y.o. 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 HBO Treatment Details Treatment Number: 32 Patient Type: Outpatient Chamber Type: Monoplace Chamber Serial #: [id number] Treatment Protocol: 2.0 ATA with 90 minutes oxygen, and no air breaks Treatment Details Compression Rate Down: 1.5 psi / minute De-Compression Rate Up: 1.5 psi / minute Compress Tx Pressure Air breaks and breathing periods Decompress Decompress Begins Reached (leave unused spaces blank) Begins Ends Chamber Pressure (ATA) 1 2 - - - - - - 2 1 Clock Time (24 hr) 10:28 10:40 - - - - - - 12:10 12:20 Treatment Length: 112 (minutes) Treatment Segments: 4 Vital Signs Capillary Blood Glucose Reference Range: 80 - 120 mg / dl HBO Diabetic Blood Glucose Intervention Range: <131 mg/dl or >249 mg/dl Time Vitals Blood Respiratory Capillary Blood Glucose Pulse Action Type: Pulse: Temperature: Taken: Pressure: Rate: Glucose (mg/dl): Meter #: Oximetry (%) Taken: Pre 09:29 128/64 78 16 97.7 132 1 none per protocol Post 12:31 118/72 78 16 97.9 203 1 none per protocol Treatment Response Treatment Toleration: Well Treatment Completion Treatment Completed without Adverse Event Status: Electronic Signature(s) Signed: 04/05/2022 1:53:30 PM By: Worthy Keeler PA-C Signed: 04/08/2022 11:00:16 AM By: [name]  RCP, RRT, CHT Entered By: [name] on 04/05/2022 13:01:06 [name] ([id number]) -------------------------------------------------------------------------------- HBO Safety Checklist Details Patient Name: [name]. Date of Service: 04/05/2022 10:00 AM Medical Record Number: [id number] Patient Account Number: [id number] Date of Birth/Sex: 07-03-1960 (61 y.o. M) Treating RN: Cornell Barman Primary Care Druscilla Petsch: [name] Other Clinician: [name] Referring Aja Whitehair: [name] Treating Jayanna Kroeger/Extender: Skipper Cliche in Treatment: 9 HBO Safety Checklist Items Safety Checklist Consent Form Signed Patient voided / foley secured and emptied When did you last eato 07:00 am Last dose of injectable or oral agent 07:00 am Ostomy pouch emptied and vented if applicable NA All implantable devices assessed, documented and approved NA Intravenous access site secured and place NA Valuables secured Linens and cotton and cotton/polyester blend (less than 51% polyester) Personal oil-based products / skin lotions / body lotions removed Wigs or hairpieces removed NA Smoking or tobacco materials removed NA Books / newspapers / magazines / loose paper removed NA Cologne, aftershave, perfume and deodorant removed Jewelry removed (may wrap wedding band) Make-up removed NA Hair care products removed Battery operated devices (external) removed NA Heating patches and chemical warmers removed NA Titanium eyewear removed NA Nail polish cured greater than 10 hours NA Casting material cured greater than 10 hours NA Hearing aids removed NA Loose dentures or partials removed NA Prosthetics have been removed NA Patient demonstrates correct use of air break device (if applicable) Patient concerns have been addressed Patient grounding bracelet on and cord attached to chamber Specifics for Inpatients (complete in addition to above) Medication  sheet sent with patient Intravenous medications needed or due during therapy sent with patient Drainage tubes (e.g. nasogastric tube or chest tube secured and vented) Endotracheal or Tracheotomy tube secured Cuff deflated of air and inflated with saline Airway suctioned Electronic  Signature(s) Signed: 04/08/2022 11:00:16 AM By: [name] RCP, RRT, CHT Entered By: [name] on 04/05/2022 10:55:31

## 2022-04-08 NOTE — Progress Notes (Addendum)
TYKEE, HEIDEMAN ([id number]) [id number] Page 1 of 12 Visit Report for 04/08/2022 Chief Complaint Document Details Patient Name: Date of Service: [name] RO LD F. 04/08/2022 8:30 A M Medical Record Number: [id number] Patient Account Number: [id number] Date of Birth/Sex: Treating RN: [birth year] (61 y.o. M) Primary Care Provider: [name] Other Clinician: Referring Provider: Treating Provider/Extender: [name] in Treatment: 9 Information Obtained from: Patient Chief Complaint Left lateral foot ulcer Electronic Signature(s) Signed: 04/08/2022 9:00:06 AM By: Worthy Keeler PA-C Entered By: Worthy Keeler on 04/08/2022 09:00:06 -------------------------------------------------------------------------------- HPI Details Patient Name: Date of Service: [name] RO LD F. 04/08/2022 8:30 A M Medical Record Number: [id number] Patient Account Number: [id number] Date of Birth/Sex: Treating RN: 20-Mar-1961 (61 y.o. M) Primary Care Provider: [name] Other Clinician: Referring Provider: Treating Provider/Extender: [name] in Treatment: 9 History of Present Illness HPI Description: Pleasant 61 year old with history of diabetes (Hgb A1c 10.8 in 2014) and peripheral neuropathy. No PVD. L ABI 1.1. Status post right great toe partial amputation years ago. He was at work and on 10/22/2014, was injured by a cart, and suffered an ulceration to his left anterior calf. He says that it subsequently became infected, and he was treated with a course of antibiotics. He was found on initial exam to have an ulceration on the dorsum of his left third toe. He was unaware of this and attributes it to pressure from his steel toed boots. More recently he injured his right anterior calf on a cart. Ambulating normally per his baseline. He has been undergoing regular debridements, applying mupirocin cream, and an Ace wrap for  edema control. He returns to clinic for follow-up and is without complaints. No pain. No fever or chills. No drainage. 10/25/15; this is a 61 year old man who has type II diabetes with diabetic polyneuropathy. He tells me that he fractured his left fifth metatarsal in June 2016 when he presented with swelling. He does not recall a specific injury. His hemoglobin A1c was apparently too high at the time for consideration of surgery and he was put in some form of offloading. Ultimately he went to surgery in December with an allograft from his calcaneus to this site, plate and screws. He had an x-ray of the foot in March that showed concerns about nonunion. He tells me that in March he had to move and basically moved himself. He was on his foot a lot and then noticed some drainage from an open area. He has been following with his orthopedic surgeon Dr. Doran Durand. He has been applying a felt donut, dry dressing and using his heel healing sandal. [name], [name] ([id number]) [id number] Page 2 of 12 11/01/15; this is a patient I saw last week for the first time. He had a small open wound on the plantar aspect of his left foot at roughly the level of the base of his fifth metatarsal. He had a considerable degree of thickened skin around this wound on the plantar aspect which I thought was from chronic pressure on this area. He tells [place] that he had drainage over the course of the week. No systemic symptoms. 11/08/15; culture last week grew Citrobacter korseri. This should've been sensitive to the Augmentin I gave him. He has seen Dr. Doran Durand who did his initial surgery and according the patient the plan is to give this another month and then the hardware might need to come out of this. This seems like a reasonable plan.  I will adjust his antibiotics to ciprofloxacin which probably should continue for at least another 2 weeks. I gave him 10 days worth today 11/22/15 the patient has completed  antibiotics. He has an appointment with Dr. Doran Durand this Friday. There is improved dimensions around the wound on the left fifth metatarsal base 11/29/15; the patient has completed antibiotics last week. Apparently his appointment with Dr. Doran Durand it is not until this Friday. Dimensions are roughly the same. 12/06/15; saw Dr. Doran Durand. No x-ray told the end of the month, next appointment June 30. We have been using Aquacel Ag 12/13/15: No major change this week. Using Aquacel AG 621/17; arrived this week with maceration around the wound. There was quite a bit of undermining which required surgical debridement. I changed him to [facility] last week, by the patient's admission he was up on this more this week 12/27/15; macerated tissue around the wound is removed with a scalpel and pickups. There is no undermining. Nonviable subcutaneous tissue and skin taken from the superior circumference of the wound is slough from the surface. READMISSION 03/06/16 since I last saw this patient at the end of June, he went for surgery on 01/11/16 by Dr. Doran Durand of orthopedics. He had a left foot irrigation and debridement, removal of hardware and placement of wound VAC. He is also been followed by Dr. Megan Salon of infectious disease and completed a six-week course of IV Rocephin for group B strep and Enterobacter in the bone at the time of surgery. Apparently at the time of surgery the bone looked healthy so I don't think any bone was actually removed. He has been using silver alginate based dressings on the same wound area at the base of the left fifth metatarsal on the left. I note that he is also had arterial studies on 01/08/16, these showed a left ABI of 1.2 to and a right ABI of 1.3. Waveforms were listed as biphasic. He was not felt to have any specific arterial issues. 03/13/16; no real change in the condition of the wound at the left lateral foot at roughly the base of his fifth metatarsal. Use silver alginate last  week. 03/18/16 arrives today with no open area. Being suspicious of the overlying callus I. Some of this back although I see nothing but covering tissue here/epithelium. There is no surrounding tenderness READMISSION 04/16/16 this is a patient I discharged about a month ago. Initially a surgical wound on the lateral aspect of his left foot which subsequently became infected. The story he is giving today that he went back into his own modified shoe started to notice pain 2 weeks ago he was seen in Dr. Nona Dell office by a physician assistant last Wednesday and according the patient was told that everything looked fine however this is clearly now broken open and he has an open wound in the same spot that we have been dealing with repetitively. Situation is complicated by the fact that he is running short of money on long-term disability. He has not taken his insulin and at least 2 weeks was previously on NovoLog short acting insulin on a sliding scale and TRESIBA 35 units at bedtime. He is no longer able to afford any of his medication he was in the x-ray on 10/15. A plain x-ray showed healed fracture of the left fifth metatarsal bone status post removal of the associated plate and screw fixation hardware. There was no acute appearing osseous abnormality. His blood work showed a white count of 9.2 with an  essentially normal differential comprehensive metabolic panel was normal. Previous CT scan of the foot on 01/08/16 showed no osteomyelitis previous vascular workup showed no evidence of significant PAD on 01/13/16 04/23/16; culture I did last week grew enterococcus [ampicillin sensitive] and MRSA. He saw infectious disease yesterday. They stop the clindamycin and ordered an MRI. This is not unreasonable. All the hardware is out of the foot at this point. 04/30/16 at this point in time we are still awaiting the results of the MRI at this point in time. Patient did have an area which appears to be somewhat  macerated in the proximal portion of the wound where there is overlying necrotic skin that is doing nothing more than trapping fluid underneath. He continues to state that he does have some discomfort and again the concern is for the possibility of osteomyelitis hence the reason for the MRI order. We have been using a silver alginate dressing but again I think the reason this with his macerated as it was is that the dressing obscene could not reach the entirety of the wound due to some necrotic skin covering the proximal portion. No bleeding noted at this point in time on initial evaluation. 05/07/16 we receive the results of patient's MRI which shows that he has no evidence of osteomyelitis. This obviously is excellent news. Patient was definitely happy to hear this. He tells me the wound appears to be doing very well at this point in time and is pleased with the progress currently. 05/15/16; as noted the patient did not have osteomyelitis. He has been released by infectious disease and orthopedics. His wound is still open he had a debridement last week but complain when he got home he "bleeding for half a day". He is not had any pain. We have been using silver alginate with Kerlix gauze wrap. 05/28/16; patient arrives today with the wound in much the same condition as last time. He has a small opening on the lateral aspect of his foot however with debridement there is clear undermining medially there is no real evidence of infection here and I didn't see any point in culturing this. One would have to wonder if this isn't a simple matter of in adequate offloading as he is been using a healing sandal. 06/05/16; the open area is now on the plantar aspect of his foot and not decide. The wound almost appears to have "migrated". This was the term use by our intake nurse. 06/12/16; open area on the plantar aspect of his foot. Base of the wound looks very healthy. This will be his second week in a total  contact cast 06/19/16; patient arrived today in a total contact cast. There was some expectation from our staff and myself that this area would be healed. Unfortunately the area was boggy and with rec pressure a fairly substantial amount of purulent drainage was obtained. Specimen obtained for culture. The patient had no complaints of systemic problems including fever or chills or instability of his diabetes. There was no pain in the foot. Nevertheless a extensive debridement was required. 06/26/16; patient's culture from the abscess last week grew a combination of MRSA and ampicillin sensitive enterococcus. I had him on Augmentin and Septra however I have elected to give him a full 10 day course of Zyvox instead as I Recently treated this combination of organisms with Augmentin and Septra before. Arrives today with no systemic symptoms 07/03/16; the patient has 2 more treatments of Zyvox and then he is finished antibiotics the  wound has improved now mostly on the lateral aspect of his foot. There is still some tenderness when he walks. 07/10/16; patient arrives today with Zyvox completed. He only has a small open area remaining. 07/24/16; she arrives here with no overt open area. The covering is thick callus/eschar. Nevertheless there is no open area here. He has some tenderness underneath the area but no overt infection is observed no drainage. The patient has a deformity in the foot with this area weekly to be exposed to more pressure in his foot where nevertheless that something we are going to have to deal with going forward. The patient has diabetic workboots and diabetic shoes. He has had a long difficult course with the area here. This started as a fracture at work. He had bone grafting from his calcaneus and screws. This got infected he had to have more surgery on the area. Bone at the time of that surgery I think showed enterococcus and group B strep. He had 6 weeks of Rocephin. Since then the  area has waxed and waned in its difficulty. Recently he had an MRI in December that did not show osteomyelitis nevertheless he had an abscess that grew MRSA and enterococcus which I elected to treat with Zyvox. This was in December and this wound is actually "healed" over 08/21/16; the patient came in today for his one-month follow-up visit. The area on the lateral aspect of his left foot looks much the same as some month ago. There is no evidence of an open wound here. However the patient tells me about a week after he went back to work he developed severe pain and swelling in the plantar aspect of his right foot first and fifth metatarsal heads. 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 He went to see his podiatrist at [facility]. She told him that he could not work on his feet. She told him to go back and his cam walking boot on the left. Not have open wounds obviously on the right. The patient is actually gone ahead and retired from his job because he does not feel he can work on his feet 09/18/16; patient comes back in saying he recently had some pain on the lateral aspect of his left foot. Asked that we look at this. Other than that all of his wounds have a viable surface. He has diabetic shoes. He is retired from work. READMISSION Nilo Fallin is a 61 year old man who we cared for for a prolonged period of time in late 2017 into March 2018. At that point he had suffered a fracture of his left fifth metatarsal at about the level of the base of the fifth metatarsal. He had surgical repair by Dr. Meryl Dare and he developed a very refractory wound over the fifth metatarsal. Ultimately this became infected he required hardware removal this eventually closed over and we discharged from the clinic in March of this year. He tells me as well until roughly 3 weeks ago when he developed increasing pain in the same area  making it difficult for him to sleep. He was GERALDO, HARIS ([id number]) [id number] Page 3 of 12 admitted to hospital from 9/5 through 9/7 with now an ulcer in the same area on the lateral aspect of his left foot at the level of the base of the metatarsal. X- rays and MRIs during this hospitalization were negative. Wound culture showed a  combination of Escherichia coli, Morganella and Pseudomonas. He was given IV Cipro and vancomycin the hospital and then discharged home on Cipro and Doxy. He returned to the ER on 9/16 with leg swelling and discomfort. I don't think anything was added at that point although he did have an ultrasound of the left leg that was negative for DVT He was back in the ER on 10/4 again with . pain in the area he was given Bactrim but states he had a significant allergic reaction to that which has abated once he stopped the Bactrim. I don't think he had a full course. He went to see his podiatrist yesterday who recommended some form of foot soak. He has a Darco forefoot offloading boot The patient is a type II diabetic on insulin. T me his recent hemoglobin A1c was 7. Arterial studies done in July 2017 showed an ABI in the right of 1.3 on the ells left 1.2 to biphasic waveforms bilaterally. He was not felt to have arterial disease. As noted the patient has had 2 MRIs most recently on 10/4 this showed no evidence of an abscess or osteomyelitis and no change since the prior study of 03/05/17 there was nonspecific edema on the dorsum of the foot. ABIs in this clinic today 1.2 which would be unchanged from his study done in July/17 04/15/17; now down to 1 small wound on the left lateral foot. We wrapped him and applied collagen last week and the foot is fairly macerated. 04/22/17; small wound on the left lateral foot however it has some undermining. Using collagen 04/29/17; small wound on the left lateral foot some surrounding callus. Chronic damage in  this area as a result of initial fracture nonhealing and secondary infection. 05/06/17; the patient arrives today with the area on the left lateral foot closed. There is thick subcutaneous tissue with a layer of callus. I took some of the callus off just to ensure adequate closure of the underlying tissue and there is no open wound here. He has considerable deformity of this area of his foot however and unless there is an ability to offload this I think opening is going to be recurrent. He tells me he has diabetic foot wear although I have not actually seen this Readmission: 09/02/17 on evaluation today patient appears to be doing a little better compared to what he tells me what's going on in regard to his left lateral foot. He was previously seen in the ER this past Saturday it is now Tuesday. On Saturday he actually was treated with antibiotics including Cipro 500 mg two times a day and doxycycline 100 mg two times a day. Subsequently he also did have an x-ray performed of his left foot which revealed increased degenerative changes of the base of the fifth metatarsal since comparison in 2018. There was no radiographic evidence of osteomyelitis however. Patient has previously had an MRI of the left foot which was performed on 04/03/17 in this revealed at that point a soft tissue ulceration but no evidence of osteomyelitis. Patient has previously undergone testing in regard to his ABI's by Dr. Bridgett Larsson and this showed that he did have ABI was within normal limits which does correspond with ABI's we checked here in the office today. That study was on 01/13/16. With all that being said on evaluation today there does not appear to be any evidence of a true open wound of the left lateral fifth metatarsal region. He does have tenderness noted as well is a  little bit of boggy/fluctuance feeling to the area in this region of his foot and there is definitely a very solid and firm callous noted laterally. With that  being said I am not able to find any obvious opening at this point there does appear to be a spot where there appears to been a opening very recently however patient states he has not noted any drainage from this currently. He does however have discomfort with palpation of this area this is in the 3-4/10 range only with very firm palpation this is not too significant at all. Subsequently he does have a small ulcer at the medial nail bed of the right second toe where he inadvertently pulled off a portion of the toenail softly causing this injury. Otherwise there does not appear to be any evidence of infection in regard to this right second toe. 09/23/17 on evaluation today patient actually appears to be doing well he has no open ulcerations noted at this point. With that being said he did have significant callous noted over the left lateral foot which did require callous pairing today he tolerated this without complication. Readmission: 11/07/17 patient presents today for readmission concerning to ulcers that he has. One on the right plantar fifth metatarsal region and the other on the left plantar fifth metatarsal region. He has previously had an x-ray which was performed on 11/05/17 and revealed that the patient had wounds noted of the base of the fifth metatarsal region without evidence of destruction to the bone to suggest osteomyelitis. Obviously this is excellent news. With that being said he does have a significantly large ulcer that extensively plantar surface of the wound and is concerning for the fact that he continues to walk on this due to the patient telling me that he "has to work" I do believe this has likely led to both the opening of the wound as well as the fact that is not really able to heal very well at this point. He has been seen by his podiatrist and apparently according to the note dated 10/23/17 the patient was provided with Santyl and recommended a dry dressing. No wound culture  has been obtained at this point. As stated above he has had returned to work due to financial reasons and is not able to wear his postop shoes due to the fact that he cannot wear this and work. The wound is stated to have been present for "several weeks" prior to the visit with the podiatrist on 10/23/17. With that being said again at least the good news is there does not appear to be any evidence of osteomyelitis. 11/14/17 on evaluation today patient appears to be doing poorly in regard to his bilateral foot ulcers. Unfortunately he seems to have significant callous with a lot of maceration underlined especially on the right even more so than the left. He has been tolerating the dressing changes without complication. With that being said overall I do think he's gonna have to have a significant debridement today. 11/21/17 on evaluation today patient presents for follow-up concerning his bilateral lateral foot ulcers. He has been tolerating the dressing changes without complication. His wife helps perform these for him. With that being said the patient states that the left lateral foot ulcer actually is continuing to give him trouble as far as discomfort is concerned. With that being said actually appears better. 11/28/17 on evaluation today patient appears to be doing poorly in regard to his left lower extremity ulcer in particular. His  right as made some progress on the lateral portion although the plantar portion is actually getting somewhat worse I think this is likely due to the fact that he continues to work although it hurts and he's been having a lot of issues out of this side and as far as pain is concerned. With that being said my biggest concern on evaluation today is that of the patient having issues with increased pain in the left lower extremity ulcer site especially on the plantar aspect. He also appears to have what seems to be an abscess developing in the dorsal/lateral portion of his foot  which I think likely is communicating somewhere internally with the ulcer site. Nonetheless he has not had any fevers, chills, nausea, or vomiting noted at this time. He has however had fairly significant increase in pain and he was Artie having a lot of pain previous. Readmission: 01-29-2022 upon evaluation today patient presents for initial inspection here in our clinic concerning the current issue which has been present since around Oct 29, 2021. The patient tells me that he has at this point been seen by Dr. Vickki Muff who has been managing this up to this point. He has also been seen by Dr. Lazaro Arms I will outline the findings there shortly as well. With that being said currently according to Dr. Alvera Singh note on 12-18-2021 the patient has a prescription for Zosyn that was given by infectious disease. Again that was back in June however. There was a hospital encounter for incision and drainage and debridement of the foot in order to clearway necrotic tissue. The patient also underwent vascular evaluation with Dr. Lucky Cowboy. Nonetheless on the postop visit on 12-18-2021 the patient was seeming to be doing quite well based on what Dr. Vickki Muff said but nonetheless since that time is really not made much progress and is concerned about losing his foot. He therefore requested a referral to wound care to be seen and evaluated and see if there is anything we can do to help and aid with getting this to heal. Fortunately there does not appear to be any evidence of active infection locally or systemically at this time which is great news. He tells me that he is currently using Betadine wet-to-dry dressings. He does not note any systemic infections but does have evidence of local infection based on what we are seeing. He has had a recent chest x-ray which showed that he had no evidence of acute cardiopulmonary disease that was performed on 12-12-2021. He also underwent vascular intervention with Dr. 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 Subsequently the patient states that Dr. Lucky Cowboy has mentioned that he would need to go back at some time with Dr. Lucky Cowboy said he would consider a posterior tibial approach for revascularization in the future but at this time because of kidney status and the amount of dye used he did not want to proceed any further at that point. Again that something that would likely be scheduled down the road. Nonetheless right now the patient does have an ABI on the left of 0.90 with a TBI of 0.6-0 but this was decreased compared to previous. His ABI on the right was 1.08. Patient did have as well MRI of the left foot which was performed on 12-12-2021 and this shows that he does  have interval resection of the proximal third of the fifth metatarsal and area of osteomyelitis on prior MRI. There is persistent ulceration of plantar lateral aspect at the resection site with a sinus tract extending towards the adjacent cuboid bone findings suspicious for osteomyelitis of the plantar lateral aspect of the cuboid and adjacent base of the fourth metatarsal possibly in the proximal most aspect of the remaining fifth metatarsal shaft as well. The ABI performed on 12-13-2021 showed that the left ABI was 0.83 which had previously been registered at 1.35 and the right ABI was 1.20. I think this is consistent with being able to heal but obviously would do better with vascular intervention not something that is going to be looked into in the future. This was noted above as well. HULON, FERRON ([id number]) [id number] Page 4 of 12 Based on what I am seeing today I do believe that the patient is definitely a candidate for HBO therapy I think this may be in his best interest as I think he is at the point of being in a limb threatening situation. I do  believe that he would benefit from hyperbarics along with continued antibiotics along with good wound care measures as well. 02-04-2022 upon evaluation today patient appears to be doing still poorly in regard to his foot he is having a lot of drainage and he notes odor as well. With that being said we still have not heard anything back from insurance yet as far as approval for HBO therapy. Subsequently the patient does have evidence of osteomyelitis and I think hyperbarics is definitely going to be something that would be beneficial for him. He has been on antibiotics several times previous but unfortunately this just does not seem to be sustained enough we did do a culture it showed Pseudomonas as one of the primary organisms both organisms would be treated with Levaquin which I think is going to be the best way to go. I discussed that with the patient today and I Georgina Peer send that into the pharmacy. 02-11-2022 upon evaluation today patient appears to be doing well currently in regard to his wound. I do feel like the Levaquin is doing better for him which is great news. Fortunately I do not see any evidence of active infection locally or systemically at this time which is great news. No fevers, chills, nausea, vomiting, or diarrhea. 02-19-2022 upon evaluation today patient appears to be doing excellent in regard to his foot ulcer all things considered he still has bone exposed but the good news is we are at least making some progress here I think he does have a lot of odor he tells me and I think switching over to Dakin's moistened gauze dressing is probably can be the way to go. 02-26-2022 upon evaluation today patient appears to be doing well currently in regard to his wound all things considered. Although the 1 thing I do see is that he is not changing the dressing as frequently as he should be. Fortunately there does not appear to be any signs of active infection he tells me his wife is out of town from  Saturday through today so it has not been changed since Saturday before that they have been doing this every other day. I explained that with the Dakin's he needs to do this every day in order to make sure that it heals appropriately. This will also help with the odor which she has been complaining of. 03-11-2022 upon evaluation today patient appears  to be doing better overall visually in regard to his wound. He is tolerating the dressing changes without complication. Fortunately there does not appear to be any evidence of active infection locally or systemically at this time. No fevers, chills, nausea, vomiting, or diarrhea. 03-19-2022 upon evaluation today patient appears to be doing decently well in regard to his wound. He is actually tolerating dressing changes without Complication. Fortunately there does not appear to be any signs of active infection locally or systemically at this time which is great news. No fevers, chills, nausea, vomiting, or diarrhea. 04-02-2022 upon evaluation today patient's wound is actually showing signs of significant improvement I am actually very pleased with where we stand today. I do not see any evidence of infection locally or systemically at this time which is great news. 04-08-2022 upon evaluation today patient actually appears to be making good progress. I am very pleased with where things stand his wound is smaller he seems to be tolerating hyperbarics well he is doing well with wound care and in general I think we are making excellent headway here. I do believe that he would likely benefit from extending the hyperbarics. I think that he is making such good progress I do not want to see anything backtrack on him at this point. Overall the benefit coupled with the antibiotics and an excellent wound care that we have been working with him with and that his wife is continuing to do at home I think he is making a great impact in getting this healed to prevent limb  loss. Electronic Signature(s) Signed: 04/09/2022 6:02:38 PM By: Worthy Keeler PA-C Entered By: Worthy Keeler on 04/09/2022 18:02:38 -------------------------------------------------------------------------------- Physical Exam Details Patient Name: Date of Service: [name] RO LD F. 04/08/2022 8:30 A M Medical Record Number: [id number] Patient Account Number: [id number] Date of Birth/Sex: Treating RN: 05/05/1961 (61 y.o. M) Primary Care Provider: [name] Other Clinician: Referring Provider: Treating Provider/Extender: [name] in Treatment: 9 Constitutional Well-nourished and well-hydrated in no acute distress. Respiratory normal breathing without difficulty. Psychiatric this patient is able to make decisions and demonstrates good insight into disease process. Alert and Oriented x 3. pleasant and cooperative. Notes Upon inspection patient's wound bed actually showed signs of excellent improvement. I am very pleased with the overall appearance and I think that he is making great progress here. I do believe would likely need to extend his hyperbarics. Electronic Signature(s) ERIKA, HUSSAR ([id number]) [id number] Page 5 of 12 Signed: 04/09/2022 6:02:51 PM By: Worthy Keeler PA-C Entered By: Worthy Keeler on 04/09/2022 18:02:51 -------------------------------------------------------------------------------- Physician Orders Details Patient Name: Date of Service: [name] RO LD F. 04/08/2022 8:30 A M Medical Record Number: [id number] Patient Account Number: [id number] Date of Birth/Sex: Treating RN: [birth year] (61 y.o. [name] Primary Care Provider: [name] Other Clinician: Referring Provider: Treating Provider/Extender: [name] in Treatment: 9 Verbal / Phone Orders: No Diagnosis Coding ICD-10 Coding Code Description [phone] Chronic multifocal osteomyelitis, left ankle and  foot E11.621 Type 2 diabetes mellitus with foot ulcer L97.524 Non-pressure chronic ulcer of other part of left foot with necrosis of bone T81.31XA Disruption of external operation (surgical) wound, not elsewhere classified, initial encounter Follow-up Appointments Return Appointment in 1 week. Bathing/ Shower/ Hygiene May shower with wound dressing protected with water repellent cover or cast protector. Edema Control - Lymphedema / Segmental Compressive Device / Other Elevate, Exercise Daily and A void Standing for Long Periods  of Time. Elevate legs to the level of the heart and pump ankles as often as possible Elevate leg(s) parallel to the floor when sitting. Off-Loading Removable cast walker boot - Left Other: - knee scooter Additional Orders / Instructions Other: - need daily dressing changes Hyperbaric Oxygen Therapy Wound #14 Left,Lateral Foot Indication and location: - Left foot osteomyelitis If appropriate for treatment, begin HBOT per protocol: 2.0 ATA for 90 Minutes without A Breaks ir One treatment per day (delivered Monday through Friday unless otherwise specified in Special Instructions below): Total # of Treatments: - 40 additional 40 requested for a total of 80 A ntihistamine 30 minutes prior to HBO Treatment, difficulty clearing ears. Finger stick Blood Glucose Pre- and Post- HBOT Treatment. Follow Hyperbaric Oxygen Glycemia Protocol Wound Treatment Wound #14 - Foot Wound Laterality: Left, Lateral Cleanser: [place] Ancillary Kit - 15 Day Supply (DME) (Generic) 3 x Per Week/30 Days Discharge Instructions: Use supplies as instructed; Kit contains: (15) Saline Bullets; (15) 3x3 Gauze; 15 pr Gloves Cleanser: Wound Cleanser 3 x Per Week/30 Days Discharge Instructions: Wash your hands with soap and water. Remove old dressing, discard into plastic bag and place into trash. Cleanse the wound with Wound Cleanser prior to applying a clean dressing using gauze sponges, not  tissues or cotton balls. Do not scrub or use excessive force. Pat dry using gauze sponges, not tissue or cotton balls. Prim Dressing: Hydrofera Blue Ready Transfer Foam, 4x5 (in/in) (DME) (Dispense As Written) 3 x Per Week/30 Days ary Discharge Instructions: Apply Hydrofera Blue Ready to wound bed as directed STEPHAUN, MILLION ([id number]) [id number] Page 6 of 12 Secondary Dressing: ABD Pad 5x9 (in/in) (DME) (Generic) 3 x Per Week/30 Days Discharge Instructions: Cover with ABD pad Secured With: Medipore T - 33M Medipore H Soft Cloth Surgical T ape ape, 2x2 (in/yd) (Generic) 3 x Per Week/30 Days Secured With: [employer] Sterile or Non-Sterile 6-ply 4.5x4 (yd/yd) (DME) (Generic) 3 x Per Week/30 Days Discharge Instructions: Apply Kerlix as directed [place] pre-HBO capillary blood glucose (ensure 1 physician order is in chart). A. Notify HBO physician and await physician orders. 2 If result is 70 mg/dl or below: B. If the result meets the hospital definition of a critical result, follow hospital policy. A. Give patient an 8 ounce Glucerna Shake, an 8 ounce Ensure, or 8 ounces of a Glucerna/Ensure equivalent dietary supplement*. B. Wait 30 minutes. If result is 71 mg/dl to 130 mg/dl: C. Retest patients capillary blood glucose (CBG). D. If result greater than or equal to 110 mg/dl, proceed with HBO. If result less than 110 mg/dl, notify HBO physician and consider holding HBO. If result is 131 mg/dl to 249 mg/dl: A. Proceed with HBO. A. Notify HBO physician and await physician orders. B. It is recommended to hold HBO and do If result is 250 mg/dl or greater: blood/urine ketone testing. C. If the result meets the hospital definition of a critical result, follow hospital policy. POST-HBO GLYCEMIA INTERVENTIONS ACTION INTERVENTION Obtain post HBO capillary blood glucose  (ensure 1 physician order is in chart). A. Notify HBO physician and await physician orders. 2 If result is 70 mg/dl or below: B. If the result meets the hospital definition of a critical result, follow hospital policy. A. Give patient an 8 ounce Glucerna Shake, an 8 ounce Ensure, or 8 ounces of a Glucerna/Ensure equivalent dietary supplement*. B. Wait 15 minutes for symptoms of If result is 71 mg/dl to 100 mg/dl: hypoglycemia (  i.e. nervousness, anxiety, sweating, chills, clamminess, irritability, confusion, tachycardia or dizziness). C. If patient asymptomatic, discharge patient. If patient symptomatic, repeat capillary blood glucose (CBG) and notify HBO physician. If result is 101 mg/dl to 249 mg/dl: A. Discharge patient. A. Notify HBO physician and await physician orders. B. It is recommended to do blood/urine ketone If result is 250 mg/dl or greater: testing. C. If the result meets the hospital definition of a critical result, follow hospital policy. *Juice or candies are NOT equivalent products. If patient refuses the Glucerna or Ensure, please consult the hospital dietitian for an appropriate substitute. Electronic Signature(s) Signed: 04/12/2022 12:12:06 PM By: Worthy Keeler PA-C Signed: 04/18/2022 11:24:03 AM By: Massie Kluver Previous Signature: 04/09/2022 6:10:21 PM Version By: Worthy Keeler PA-C Previous Signature: 04/08/2022 6:29:15 PM Version By: Gretta Cool, BSN, RN, CWS, Kim RN, BSN Entered By: Massie Kluver on 04/12/2022 10:24:59 [name] ([id number]) [id number] Page 7 of 12 -------------------------------------------------------------------------------- Problem List Details Patient Name: Date of Service: Arie Sabina LD F. 04/08/2022 8:30 A M Medical Record Number: [id number] Patient Account Number: [id number] Date of Birth/Sex: Treating RN: 14-Nov-1960 (61 y.o. M) Primary Care Provider: [name] Other  Clinician: Referring Provider: Treating Provider/Extender: [name] in Treatment: 9 Active Problems ICD-10 Encounter Code Description Active Date MDM Diagnosis [phone] Chronic multifocal osteomyelitis, left ankle and foot 01/29/2022 No Yes E11.621 Type 2 diabetes mellitus with foot ulcer 01/29/2022 No Yes L97.524 Non-pressure chronic ulcer of other part of left foot with necrosis of bone 01/29/2022 No Yes T81.31XA Disruption of external operation (surgical) wound, not elsewhere classified, 01/29/2022 No Yes initial encounter Inactive Problems Resolved Problems Electronic Signature(s) Signed: 04/08/2022 8:59:59 AM By: Worthy Keeler PA-C Entered By: Worthy Keeler on 04/08/2022 08:59:59 -------------------------------------------------------------------------------- Progress Note Details Patient Name: Date of Service: [name] RO LD F. 04/08/2022 8:30 A M Medical Record Number: [id number] Patient Account Number: [id number] Date of Birth/Sex: Treating RN: [birth year] (61 y.o. M) Primary Care Provider: [name] Other Clinician: Referring Provider: Treating Provider/Extender: [name] in Treatment: [address] [place] F ([id number]) [id number] Page 8 of 12 Subjective Chief Complaint Information obtained from Patient Left lateral foot ulcer History of Present Illness (HPI) Pleasant 61 year old with history of diabetes (Hgb A1c 10.8 in 2014) and peripheral neuropathy. No PVD. L ABI 1.1. Status post right great toe partial amputation years ago. He was at work and on 10/22/2014, was injured by a cart, and suffered an ulceration to his left anterior calf. He says that it subsequently became infected, and he was treated with a course of antibiotics. He was found on initial exam to have an ulceration on the dorsum of his left third toe. He was unaware of this and attributes it to pressure from his steel  toed boots. More recently he injured his right anterior calf on a cart. Ambulating normally per his baseline. He has been undergoing regular debridements, applying mupirocin cream, and an Ace wrap for edema control. He returns to clinic for follow-up and is without complaints. No pain. No fever or chills. No drainage. 10/25/15; this is a 61 year old man who has type II diabetes with diabetic polyneuropathy. He tells me that he fractured his left fifth metatarsal in June 2016 when he presented with swelling. He does not recall a specific injury. His hemoglobin A1c was apparently too high at the time for consideration of surgery and he was put in some form of offloading. Ultimately he went to surgery  in December with an allograft from his calcaneus to this site, plate and screws. He had an x-ray of the foot in March that showed concerns about nonunion. He tells me that in March he had to move and basically moved himself. He was on his foot a lot and then noticed some drainage from an open area. He has been following with his orthopedic surgeon Dr. Doran Durand. He has been applying a felt donut, dry dressing and using his heel healing sandal. 11/01/15; this is a patient I saw last week for the first time. He had a small open wound on the plantar aspect of his left foot at roughly the level of the base of his fifth metatarsal. He had a considerable degree of thickened skin around this wound on the plantar aspect which I thought was from chronic pressure on this area. He tells [place] that he had drainage over the course of the week. No systemic symptoms. 11/08/15; culture last week grew Citrobacter korseri. This should've been sensitive to the Augmentin I gave him. He has seen Dr. Doran Durand who did his initial surgery and according the patient the plan is to give this another month and then the hardware might need to come out of this. This seems like a reasonable plan. I will adjust his antibiotics to ciprofloxacin which  probably should continue for at least another 2 weeks. I gave him 10 days worth today 11/22/15 the patient has completed antibiotics. He has an appointment with Dr. Doran Durand this Friday. There is improved dimensions around the wound on the left fifth metatarsal base 11/29/15; the patient has completed antibiotics last week. Apparently his appointment with Dr. Doran Durand it is not until this Friday. Dimensions are roughly the same. 12/06/15; saw Dr. Doran Durand. No x-ray told the end of the month, next appointment June 30. We have been using Aquacel Ag 12/13/15: No major change this week. Using Aquacel AG 621/17; arrived this week with maceration around the wound. There was quite a bit of undermining which required surgical debridement. I changed him to [facility] last week, by the patient's admission he was up on this more this week 12/27/15; macerated tissue around the wound is removed with a scalpel and pickups. There is no undermining. Nonviable subcutaneous tissue and skin taken from the superior circumference of the wound is slough from the surface. READMISSION 03/06/16 since I last saw this patient at the end of June, he went for surgery on 01/11/16 by Dr. Doran Durand of orthopedics. He had a left foot irrigation and debridement, removal of hardware and placement of wound VAC. He is also been followed by Dr. Megan Salon of infectious disease and completed a six-week course of IV Rocephin for group B strep and Enterobacter in the bone at the time of surgery. Apparently at the time of surgery the bone looked healthy so I don't think any bone was actually removed. He has been using silver alginate based dressings on the same wound area at the base of the left fifth metatarsal on the left. I note that he is also had arterial studies on 01/08/16, these showed a left ABI of 1.2 to and a right ABI of 1.3. Waveforms were listed as biphasic. He was not felt to have any specific arterial issues. 03/13/16; no real change in the  condition of the wound at the left lateral foot at roughly the base of his fifth metatarsal. Use silver alginate last week. 03/18/16 arrives today with no open area. Being suspicious of the overlying callus  I. Some of this back although I see nothing but covering tissue here/epithelium. There is no surrounding tenderness READMISSION 04/16/16 this is a patient I discharged about a month ago. Initially a surgical wound on the lateral aspect of his left foot which subsequently became infected. The story he is giving today that he went back into his own modified shoe started to notice pain 2 weeks ago he was seen in Dr. Nona Dell office by a physician assistant last Wednesday and according the patient was told that everything looked fine however this is clearly now broken open and he has an open wound in the same spot that we have been dealing with repetitively. Situation is complicated by the fact that he is running short of money on long-term disability. He has not taken his insulin and at least 2 weeks was previously on NovoLog short acting insulin on a sliding scale and TRESIBA 35 units at bedtime. He is no longer able to afford any of his medication he was in the x-ray on 10/15. A plain x-ray showed healed fracture of the left fifth metatarsal bone status post removal of the associated plate and screw fixation hardware. There was no acute appearing osseous abnormality. His blood work showed a white count of 9.2 with an essentially normal differential comprehensive metabolic panel was normal. Previous CT scan of the foot on 01/08/16 showed no osteomyelitis previous vascular workup showed no evidence of significant PAD on 01/13/16 04/23/16; culture I did last week grew enterococcus [ampicillin sensitive] and MRSA. He saw infectious disease yesterday. They stop the clindamycin and ordered an MRI. This is not unreasonable. All the hardware is out of the foot at this point. 04/30/16 at this point in time we  are still awaiting the results of the MRI at this point in time. Patient did have an area which appears to be somewhat macerated in the proximal portion of the wound where there is overlying necrotic skin that is doing nothing more than trapping fluid underneath. He continues to state that he does have some discomfort and again the concern is for the possibility of osteomyelitis hence the reason for the MRI order. We have been using a silver alginate dressing but again I think the reason this with his macerated as it was is that the dressing obscene could not reach the entirety of the wound due to some necrotic skin covering the proximal portion. No bleeding noted at this point in time on initial evaluation. 05/07/16 we receive the results of patient's MRI which shows that he has no evidence of osteomyelitis. This obviously is excellent news. Patient was definitely happy to hear this. He tells me the wound appears to be doing very well at this point in time and is pleased with the progress currently. 05/15/16; as noted the patient did not have osteomyelitis. He has been released by infectious disease and orthopedics. His wound is still open he had a debridement last week but complain when he got home he "bleeding for half a day". He is not had any pain. We have been using silver alginate with Kerlix gauze wrap. 05/28/16; patient arrives today with the wound in much the same condition as last time. He has a small opening on the lateral aspect of his foot however with debridement there is clear undermining medially there is no real evidence of infection here and I didn't see any point in culturing this. One would have to wonder if this isn't a simple matter of in adequate offloading as  he is been using a healing sandal. 06/05/16; the open area is now on the plantar aspect of his foot and not decide. The wound almost appears to have "migrated". This was the term use by our intake nurse. 06/12/16; open area  on the plantar aspect of his foot. Base of the wound looks very healthy. This will be his second week in a total contact cast 06/19/16; patient arrived today in a total contact cast. There was some expectation from our staff and myself that this area would be healed. Unfortunately the area was boggy and with rec pressure a fairly substantial amount of purulent drainage was obtained. Specimen obtained for culture. The patient had no complaints of systemic problems including fever or chills or instability of his diabetes. There was no pain in the foot. Nevertheless a extensive debridement was required. 06/26/16; patient's culture from the abscess last week grew a combination of MRSA and ampicillin sensitive enterococcus. I had him on Augmentin and Septra however I have elected to give him a full 10 day course of Zyvox instead as I Recently treated this combination of organisms with Augmentin and Septra before. Arrives today with no systemic symptoms RAMEZ, ARRONA ([id number]) [id number] Page 9 of 12 07/03/16; the patient has 2 more treatments of Zyvox and then he is finished antibiotics the wound has improved now mostly on the lateral aspect of his foot. There is still some tenderness when he walks. 07/10/16; patient arrives today with Zyvox completed. He only has a small open area remaining. 07/24/16; she arrives here with no overt open area. The covering is thick callus/eschar. Nevertheless there is no open area here. He has some tenderness underneath the area but no overt infection is observed no drainage. The patient has a deformity in the foot with this area weekly to be exposed to more pressure in his foot where nevertheless that something we are going to have to deal with going forward. The patient has diabetic workboots and diabetic shoes. He has had a long difficult course with the area here. This started as a fracture at work. He had bone grafting from his calcaneus  and screws. This got infected he had to have more surgery on the area. Bone at the time of that surgery I think showed enterococcus and group B strep. He had 6 weeks of Rocephin. Since then the area has waxed and waned in its difficulty. Recently he had an MRI in December that did not show osteomyelitis nevertheless he had an abscess that grew MRSA and enterococcus which I elected to treat with Zyvox. This was in December and this wound is actually "healed" over 08/21/16; the patient came in today for his one-month follow-up visit. The area on the lateral aspect of his left foot looks much the same as some month ago. There is no evidence of an open wound here. However the patient tells me about a week after he went back to work he developed severe pain and swelling in the plantar aspect of his right foot first and fifth metatarsal heads. 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 He went to see his podiatrist at [facility]. She told him that he could not work on his feet. She told him to go back and his cam walking boot on the left. Not have open wounds obviously on the right. The patient is actually gone ahead and retired from his job  because he does not feel he can work on his feet 09/18/16; patient comes back in saying he recently had some pain on the lateral aspect of his left foot. Asked that we look at this. Other than that all of his wounds have a viable surface. He has diabetic shoes. He is retired from work. READMISSION Ambrosio Reuter is a 61 year old man who we cared for for a prolonged period of time in late 2017 into March 2018. At that point he had suffered a fracture of his left fifth metatarsal at about the level of the base of the fifth metatarsal. He had surgical repair by Dr. Meryl Dare and he developed a very refractory wound over the fifth metatarsal. Ultimately this became infected he required hardware  removal this eventually closed over and we discharged from the clinic in March of this year. He tells me as well until roughly 3 weeks ago when he developed increasing pain in the same area making it difficult for him to sleep. He was admitted to hospital from 9/5 through 9/7 with now an ulcer in the same area on the lateral aspect of his left foot at the level of the base of the metatarsal. X- rays and MRIs during this hospitalization were negative. Wound culture showed a combination of Escherichia coli, Morganella and Pseudomonas. He was given IV Cipro and vancomycin the hospital and then discharged home on Cipro and Doxy. He returned to the ER on 9/16 with leg swelling and discomfort. I don't think anything was added at that point although he did have an ultrasound of the left leg that was negative for DVT He was back in the ER on 10/4 again with . pain in the area he was given Bactrim but states he had a significant allergic reaction to that which has abated once he stopped the Bactrim. I don't think he had a full course. He went to see his podiatrist yesterday who recommended some form of foot soak. He has a Darco forefoot offloading boot The patient is a type II diabetic on insulin. T me his recent hemoglobin A1c was 7. Arterial studies done in July 2017 showed an ABI in the right of 1.3 on the ells left 1.2 to biphasic waveforms bilaterally. He was not felt to have arterial disease. As noted the patient has had 2 MRIs most recently on 10/4 this showed no evidence of an abscess or osteomyelitis and no change since the prior study of 03/05/17 there was nonspecific edema on the dorsum of the foot. ABIs in this clinic today 1.2 which would be unchanged from his study done in July/17 04/15/17; now down to 1 small wound on the left lateral foot. We wrapped him and applied collagen last week and the foot is fairly macerated. 04/22/17; small wound on the left lateral foot however it has some undermining.  Using collagen 04/29/17; small wound on the left lateral foot some surrounding callus. Chronic damage in this area as a result of initial fracture nonhealing and secondary infection. 05/06/17; the patient arrives today with the area on the left lateral foot closed. There is thick subcutaneous tissue with a layer of callus. I took some of the callus off just to ensure adequate closure of the underlying tissue and there is no open wound here. He has considerable deformity of this area of his foot however and unless there is an ability to offload this I think opening is going to be recurrent. He tells me he has diabetic foot wear although  I have not actually seen this Readmission: 09/02/17 on evaluation today patient appears to be doing a little better compared to what he tells me what's going on in regard to his left lateral foot. He was previously seen in the ER this past Saturday it is now Tuesday. On Saturday he actually was treated with antibiotics including Cipro 500 mg two times a day and doxycycline 100 mg two times a day. Subsequently he also did have an x-ray performed of his left foot which revealed increased degenerative changes of the base of the fifth metatarsal since comparison in 2018. There was no radiographic evidence of osteomyelitis however. Patient has previously had an MRI of the left foot which was performed on 04/03/17 in this revealed at that point a soft tissue ulceration but no evidence of osteomyelitis. Patient has previously undergone testing in regard to his ABI's by Dr. Bridgett Larsson and this showed that he did have ABI was within normal limits which does correspond with ABI's we checked here in the office today. That study was on 01/13/16. With all that being said on evaluation today there does not appear to be any evidence of a true open wound of the left lateral fifth metatarsal region. He does have tenderness noted as well is a little bit of boggy/fluctuance feeling to the area in  this region of his foot and there is definitely a very solid and firm callous noted laterally. With that being said I am not able to find any obvious opening at this point there does appear to be a spot where there appears to been a opening very recently however patient states he has not noted any drainage from this currently. He does however have discomfort with palpation of this area this is in the 3-4/10 range only with very firm palpation this is not too significant at all. Subsequently he does have a small ulcer at the medial nail bed of the right second toe where he inadvertently pulled off a portion of the toenail softly causing this injury. Otherwise there does not appear to be any evidence of infection in regard to this right second toe. 09/23/17 on evaluation today patient actually appears to be doing well he has no open ulcerations noted at this point. With that being said he did have significant callous noted over the left lateral foot which did require callous pairing today he tolerated this without complication. Readmission: 11/07/17 patient presents today for readmission concerning to ulcers that he has. One on the right plantar fifth metatarsal region and the other on the left plantar fifth metatarsal region. He has previously had an x-ray which was performed on 11/05/17 and revealed that the patient had wounds noted of the base of the fifth metatarsal region without evidence of destruction to the bone to suggest osteomyelitis. Obviously this is excellent news. With that being said he does have a significantly large ulcer that extensively plantar surface of the wound and is concerning for the fact that he continues to walk on this due to the patient telling me that he "has to work" I do believe this has likely led to both the opening of the wound as well as the fact that is not really able to heal very well at this point. He has been seen by his podiatrist and apparently according to the  note dated 10/23/17 the patient was provided with Santyl and recommended a dry dressing. No wound culture has been obtained at this point. As stated above he has had  returned to work due to financial reasons and is not able to wear his postop shoes due to the fact that he cannot wear this and work. The wound is stated to have been present for "several weeks" prior to the visit with the podiatrist on 10/23/17. With that being said again at least the good news is there does not appear to be any evidence of osteomyelitis. 11/14/17 on evaluation today patient appears to be doing poorly in regard to his bilateral foot ulcers. Unfortunately he seems to have significant callous with a lot of maceration underlined especially on the right even more so than the left. He has been tolerating the dressing changes without complication. With that being said overall I do think he's gonna have to have a significant debridement today. 11/21/17 on evaluation today patient presents for follow-up concerning his bilateral lateral foot ulcers. He has been tolerating the dressing changes without complication. His wife helps perform these for him. With that being said the patient states that the left lateral foot ulcer actually is continuing to give him trouble as far as discomfort is concerned. With that being said actually appears better. 11/28/17 on evaluation today patient appears to be doing poorly in regard to his left lower extremity ulcer in particular. His right as made some progress on the lateral portion although the plantar portion is actually getting somewhat worse I think this is likely due to the fact that he continues to work although it hurts and he's been having a lot of issues out of this side and as far as pain is concerned. With that being said my biggest concern on evaluation today is that of the patient having issues with increased pain in the left lower extremity ulcer site especially on the plantar aspect.  He also appears to have what seems to be an abscess developing in the dorsal/lateral portion of his foot which I think likely is communicating somewhere internally with the ulcer site. Nonetheless he has not had any fevers, chills, nausea, or vomiting noted at this time. He has however had fairly significant increase in pain and he was Artie having a lot of pain ESTEL, SCHOLZE ([id number]) [id number] Page 10 of 12 previous. Readmission: 01-29-2022 upon evaluation today patient presents for initial inspection here in our clinic concerning the current issue which has been present since around Oct 29, 2021. The patient tells me that he has at this point been seen by Dr. Vickki Muff who has been managing this up to this point. He has also been seen by Dr. Lazaro Arms I will outline the findings there shortly as well. With that being said currently according to Dr. Alvera Singh note on 12-18-2021 the patient has a prescription for Zosyn that was given by infectious disease. Again that was back in June however. There was a hospital encounter for incision and drainage and debridement of the foot in order to clearway necrotic tissue. The patient also underwent vascular evaluation with Dr. Lucky Cowboy. Nonetheless on the postop visit on 12-18-2021 the patient was seeming to be doing quite well based on what Dr. Vickki Muff said but nonetheless since that time is really not made much progress and is concerned about losing his foot. He therefore requested a referral to wound care to be seen and evaluated and see if there is anything we can do to help and aid with getting this to heal. Fortunately there does not appear to be any evidence of active infection locally or systemically at this time which is great  news. He tells me that he is currently using Betadine wet-to-dry dressings. He does not note any systemic infections but does have evidence of local infection based on what we are seeing. He has had a recent chest  x-ray which showed that he had no evidence of acute cardiopulmonary disease that was performed on 12-12-2021. He also underwent vascular intervention with Dr. 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 Subsequently the patient states that Dr. Lucky Cowboy has mentioned that he would need to go back at some time with Dr. Lucky Cowboy said he would consider a posterior tibial approach for revascularization in the future but at this time because of kidney status and the amount of dye used he did not want to proceed any further at that point. Again that something that would likely be scheduled down the road. Nonetheless right now the patient does have an ABI on the left of 0.90 with a TBI of 0.6-0 but this was decreased compared to previous. His ABI on the right was 1.08. Patient did have as well MRI of the left foot which was performed on 12-12-2021 and this shows that he does have interval resection of the proximal third of the fifth metatarsal and area of osteomyelitis on prior MRI. There is persistent ulceration of plantar lateral aspect at the resection site with a sinus tract extending towards the adjacent cuboid bone findings suspicious for osteomyelitis of the plantar lateral aspect of the cuboid and adjacent base of the fourth metatarsal possibly in the proximal most aspect of the remaining fifth metatarsal shaft as well. The ABI performed on 12-13-2021 showed that the left ABI was 0.83 which had previously been registered at 1.35 and the right ABI was 1.20. I think this is consistent with being able to heal but obviously would do better with vascular intervention not something that is going to be looked into in the future. This was noted above as well. Based on what I am seeing today I do believe that the patient is definitely a candidate for HBO therapy I think this  may be in his best interest as I think he is at the point of being in a limb threatening situation. I do believe that he would benefit from hyperbarics along with continued antibiotics along with good wound care measures as well. 02-04-2022 upon evaluation today patient appears to be doing still poorly in regard to his foot he is having a lot of drainage and he notes odor as well. With that being said we still have not heard anything back from insurance yet as far as approval for HBO therapy. Subsequently the patient does have evidence of osteomyelitis and I think hyperbarics is definitely going to be something that would be beneficial for him. He has been on antibiotics several times previous but unfortunately this just does not seem to be sustained enough we did do a culture it showed Pseudomonas as one of the primary organisms both organisms would be treated with Levaquin which I think is going to be the best way to go. I discussed that with the patient today and I Georgina Peer send that into the pharmacy. 02-11-2022 upon evaluation today patient appears to be doing well currently in regard to his wound. I do feel like the Levaquin is doing better for him which is great news. Fortunately  I do not see any evidence of active infection locally or systemically at this time which is great news. No fevers, chills, nausea, vomiting, or diarrhea. 02-19-2022 upon evaluation today patient appears to be doing excellent in regard to his foot ulcer all things considered he still has bone exposed but the good news is we are at least making some progress here I think he does have a lot of odor he tells me and I think switching over to Dakin's moistened gauze dressing is probably can be the way to go. 02-26-2022 upon evaluation today patient appears to be doing well currently in regard to his wound all things considered. Although the 1 thing I do see is that he is not changing the dressing as frequently as he should be.  Fortunately there does not appear to be any signs of active infection he tells me his wife is out of town from Saturday through today so it has not been changed since Saturday before that they have been doing this every other day. I explained that with the Dakin's he needs to do this every day in order to make sure that it heals appropriately. This will also help with the odor which she has been complaining of. 03-11-2022 upon evaluation today patient appears to be doing better overall visually in regard to his wound. He is tolerating the dressing changes without complication. Fortunately there does not appear to be any evidence of active infection locally or systemically at this time. No fevers, chills, nausea, vomiting, or diarrhea. 03-19-2022 upon evaluation today patient appears to be doing decently well in regard to his wound. He is actually tolerating dressing changes without Complication. Fortunately there does not appear to be any signs of active infection locally or systemically at this time which is great news. No fevers, chills, nausea, vomiting, or diarrhea. 04-02-2022 upon evaluation today patient's wound is actually showing signs of significant improvement I am actually very pleased with where we stand today. I do not see any evidence of infection locally or systemically at this time which is great news. 04-08-2022 upon evaluation today patient actually appears to be making good progress. I am very pleased with where things stand his wound is smaller he seems to be tolerating hyperbarics well he is doing well with wound care and in general I think we are making excellent headway here. I do believe that he would likely benefit from extending the hyperbarics. I think that he is making such good progress I do not want to see anything backtrack on him at this point. Overall the benefit coupled with the antibiotics and an excellent wound care that we have been working with him with and that his  wife is continuing to do at home I think he is making a great impact in getting this healed to prevent limb loss. Objective Constitutional Well-nourished and well-hydrated in no acute distress. Vitals Time Taken: 8:44 AM, Height: 70 in, Weight: 255 lbs, BMI: 36.6, Temperature: 97.9 F, Pulse: 98 bpm, Respiratory Rate: 16 breaths/min, Blood Pressure: 152/86 mmHg. Respiratory normal breathing without difficulty. Psychiatric this patient is able to make decisions and demonstrates good insight into disease process. Alert and Oriented x 3. pleasant and cooperative. General Notes: Upon inspection patient's wound bed actually showed signs of excellent improvement. I am very pleased with the overall appearance and I think that he is making great progress here. I do believe would likely need to extend his hyperbarics. SKY, BORBOA ([id number]) [id number] Page 11 of 12  Integumentary (Hair, Skin) Wound #14 status is Open. Original cause of wound was Blister. The date acquired was: 10/29/2021. The wound has been in treatment 9 weeks. The wound is located on the Left,Lateral Foot. The wound measures 4.5cm length x 2.5cm width x 1cm depth; 8.836cm^2 area and 8.836cm^3 volume. There is bone, muscle, and Fat Layer (Subcutaneous Tissue) exposed. There is no tunneling or undermining noted. There is a medium amount of sanguinous drainage noted. The wound margin is epibole. There is large (67-100%) red, friable, hyper - granulation within the wound bed. There is a small (1-33%) amount of necrotic tissue within the wound bed including Adherent Slough. General Notes: Callus surrounding wound. Assessment Active Problems ICD-10 Chronic multifocal osteomyelitis, left ankle and foot Type 2 diabetes mellitus with foot ulcer Non-pressure chronic ulcer of other part of left foot with necrosis of bone Disruption of external operation (surgical) wound, not elsewhere classified, initial  encounter Plan Follow-up Appointments: Return Appointment in 1 week. Bathing/ Shower/ Hygiene: May shower with wound dressing protected with water repellent cover or cast protector. Edema Control - Lymphedema / Segmental Compressive Device / Other: Elevate, Exercise Daily and Avoid Standing for Long Periods of Time. Elevate legs to the level of the heart and pump ankles as often as possible Elevate leg(s) parallel to the floor when sitting. Off-Loading: Removable cast walker boot - Left Other: - knee scooter Additional Orders / Instructions: Other: - need daily dressing changes Hyperbaric Oxygen Therapy: Wound #14 Left,Lateral Foot: Indication and location: - Left foot osteomyelitis If appropriate for treatment, begin HBOT per protocol: 2.0 ATA for 90 Minutes without Air Breaks One treatment per day (delivered Monday through Friday unless otherwise specified in Special Instructions below): T # of Treatments: - 40 additional 40 requested for a total of 80 otal Antihistamine 30 minutes prior to HBO Treatment, difficulty clearing ears. Finger stick Blood Glucose Pre- and Post- HBOT Treatment. Follow Hyperbaric Oxygen Glycemia Protocol WOUND #14: - Foot Wound Laterality: Left, Lateral Cleanser: Wound Cleanser 3 x Per Week/30 Days Discharge Instructions: Wash your hands with soap and water. Remove old dressing, discard into plastic bag and place into trash. Cleanse the wound with Wound Cleanser prior to applying a clean dressing using gauze sponges, not tissues or cotton balls. Do not scrub or use excessive force. Pat dry using gauze sponges, not tissue or cotton balls. Prim Dressing: Hydrofera Blue Ready Transfer Foam, 4x5 (in/in) (Dispense As Written) 3 x Per Week/30 Days ary Discharge Instructions: Apply Hydrofera Blue Ready to wound bed as directed Secondary Dressing: ABD Pad 5x9 (in/in) (Generic) 3 x Per Week/30 Days Discharge Instructions: Cover with ABD pad Secured With:  Medipore T - 67M Medipore H Soft Cloth Surgical T ape ape, 2x2 (in/yd) (Generic) 3 x Per Week/30 Days Secured With: [employer] Sterile or Non-Sterile 6-ply 4.5x4 (yd/yd) (Generic) 3 x Per Week/30 Days Discharge Instructions: Apply Kerlix as directed 1. I am going to suggest a based on what I am seeing currently that we go ahead and have the patient continue with the [facility] dressing which I think is actually doing quite well. I did not even have to perform sharp debridement today. 2. I am also going to recommend that we have him continue with hyperbarics. I would like to see about getting this extended for an additional 40 treatments to try to get this wound completely closed. He is in agreement with that plan and we just need to check in with insurance to ensure that we can  get this approved. He is nearing the completion of his first 40 treatments and to be honest he is benefiting in such a great way from this I do not want to stop it at this point. He voiced understanding. I do believe this is beneficial in preventing limb loss and helping to salvage this foot. 3. With regard to his offloading he is doing great with the knee scooter I would suggest he continue as such. 4. Based on the fact that the patient is doing so well with the hyperbarics I do believe that this needs to be an expedited review by insurance to continue uninterrupted his therapy. Again the goal is to prevent amputation and I do not want a delay in continuing approval to be a reason that things turn poorly for him. The patient voiced understanding. Again we will get a be working with insurance to get things approved in time to hopefully continue therapy uninterrupted. We will see patient back for reevaluation in 1 week here in the clinic. If anything worsens or changes patient will contact our office for additional recommendations. Electronic Signature(s) Signed: 04/11/2022 12:59:09 PM By: Worthy Keeler PA-C Previous  Signature: 04/11/2022 10:23:27 AM Version By: Worthy Keeler PA-C Previous Signature: 04/09/2022 6:04:31 PM Version By: Vic Blackbird, [place] F ([id number]) [id number] Page 12 of 12 Previous Signature: 04/09/2022 6:04:31 PM Version By: Worthy Keeler PA-C Entered By: Worthy Keeler on 04/11/2022 12:59:08 -------------------------------------------------------------------------------- SuperBill Details Patient Name: Date of Service: [name] RO LD F. 04/08/2022 Medical Record Number: [id number] Patient Account Number: [id number] Date of Birth/Sex: Treating RN: Feb 19, 1961 (61 y.o. [name], [name] Primary Care Provider: [name] Other Clinician: Referring Provider: Treating Provider/Extender: [name] in Treatment: 9 Diagnosis Coding ICD-10 Codes Code Description [phone] Chronic multifocal osteomyelitis, left ankle and foot E11.621 Type 2 diabetes mellitus with foot ulcer L97.524 Non-pressure chronic ulcer of other part of left foot with necrosis of bone T81.31XA Disruption of external operation (surgical) wound, not elsewhere classified, initial encounter Facility Procedures : CPT4 Code: [id number] Description: 99213 - WOUND CARE VISIT-LEV 3 EST PT Modifier: Quantity: 1 Physician Procedures : CPT4 Code Description Modifier [id number] - WC PHYS LEVEL 4 - EST PT ICD-10 Diagnosis Description M86.372 Chronic multifocal osteomyelitis, left ankle and foot E11.621 Type 2 diabetes mellitus with foot ulcer L97.524 Non-pressure chronic ulcer of  other part of left foot with necrosis of bone T81.31XA Disruption of external operation (surgical) wound, not elsewhere classified, initial encounter Quantity: 1 Electronic Signature(s) Signed: 04/09/2022 6:04:48 PM By: Worthy Keeler PA-C Previous Signature: 04/08/2022 6:29:15 PM Version By: Gretta Cool, BSN, RN, CWS, Kim RN, BSN Entered By: Worthy Keeler on 04/09/2022 18:04:47

## 2022-04-09 ENCOUNTER — Ambulatory Visit: Payer: 59 | Admitting: Physician Assistant

## 2022-04-09 ENCOUNTER — Encounter: Payer: 59 | Admitting: Physician Assistant

## 2022-04-09 DIAGNOSIS — E11621 Type 2 diabetes mellitus with foot ulcer: Secondary | ICD-10-CM | POA: Diagnosis not present

## 2022-04-09 LAB — GLUCOSE, CAPILLARY
Glucose-Capillary: 203 mg/dL — ABNORMAL HIGH (ref 70–99)
Glucose-Capillary: 251 mg/dL — ABNORMAL HIGH (ref 70–99)

## 2022-04-09 NOTE — Progress Notes (Signed)
MARKEZ, DOWLAND ([id number]) Visit Report for 04/09/2022 Arrival Information Details Patient Name: [name], [name]. Date of Service: 04/09/2022 8:00 AM Medical Record Number: [id number] Patient Account Number: [id number] Date of Birth/Sex: [birth year] (61 y.o. M) Treating RN: Cornell Barman Primary Care Harmonee Tozer: [name] Other Clinician: [name] Referring Arcenia Scarbro: [name] Treating Lavine Hargrove/Extender: Skipper Cliche in Treatment: 10 Visit Information History Since Last Visit Added or deleted any medications: No Patient Arrived: Knee Scooter Any new allergies or adverse reactions: No Arrival Time: 07:50 Had a fall or experienced change in No Accompanied By: self activities of daily living that may affect Transfer Assistance: None risk of falls: Patient Identification Verified: Yes Signs or symptoms of abuse/neglect since last visito No Secondary Verification Process Completed: Yes Hospitalized since last visit: No Patient Requires Transmission-Based Precautions: No Implantable device outside of the clinic excluding No Patient Has Alerts: No cellular tissue based products placed in the center since last visit: Has Dressing in Place as Prescribed: Yes Has Footwear/Offloading in Place as Prescribed: Yes Left: Removable Cast Walker/Walking Boot Pain Present Now: No Electronic Signature(s) Signed: 04/09/2022 11:23:07 AM By: [name] RCP, RRT, CHT Entered By: [name] on 04/09/2022 07:56:37 [name] ([id number]) -------------------------------------------------------------------------------- Encounter Discharge Information Details Patient Name: [name]. Date of Service: 04/09/2022 8:00 AM Medical Record Number: [id number] Patient Account Number: [id number] Date of Birth/Sex: [birth year] (61 y.o. M) Treating RN: Cornell Barman Primary Care Tyechia Allmendinger: [name] Other Clinician: [name] Referring Kary Colaizzi:  [name] Treating Mikya Don/Extender: Skipper Cliche in Treatment: 10 Encounter Discharge Information Items Discharge Condition: Stable Ambulatory Status: Knee Scooter Discharge Destination: Home Transportation: Private Auto Accompanied By: self Schedule Follow-up Appointment: Yes Clinical Summary of Care: Notes Patient has an HBO treatment scheduled on 04/10/22 at 08:00 am. Electronic Signature(s) Signed: 04/09/2022 11:23:07 AM By: [name] RCP, RRT, CHT Entered By: [name] on 04/09/2022 10:34:28 [name] ([id number]) -------------------------------------------------------------------------------- Vitals Details Patient Name: [name]. Date of Service: 04/09/2022 8:00 AM Medical Record Number: [id number] Patient Account Number: [id number] Date of Birth/Sex: 1960-12-03 (61 y.o. M) Treating RN: Cornell Barman Primary Care Crystin Lechtenberg: [name] Other Clinician: [name] Referring Jonavin Seder: [name] Treating Lurline Caver/Extender: Skipper Cliche in Treatment: 10 Vital Signs Time Taken: 08:00 Temperature (F): 97.8 Height (in): 70 Pulse (bpm): 90 Weight (lbs): 255 Respiratory Rate (breaths/min): 16 Body Mass Index (BMI): 36.6 Blood Pressure (mmHg): 112/64 Capillary Blood Glucose (mg/dl): 203 Reference Range: 80 - 120 mg / dl Electronic Signature(s) Signed: 04/09/2022 11:23:07 AM By: [name] RCP, RRT, CHT Entered By: [name] on 04/09/2022 08:18:37

## 2022-04-10 ENCOUNTER — Encounter (HOSPITAL_BASED_OUTPATIENT_CLINIC_OR_DEPARTMENT_OTHER): Payer: 59 | Admitting: Internal Medicine

## 2022-04-10 DIAGNOSIS — L97524 Non-pressure chronic ulcer of other part of left foot with necrosis of bone: Secondary | ICD-10-CM

## 2022-04-10 DIAGNOSIS — E11621 Type 2 diabetes mellitus with foot ulcer: Secondary | ICD-10-CM | POA: Diagnosis not present

## 2022-04-10 DIAGNOSIS — M86372 Chronic multifocal osteomyelitis, left ankle and foot: Secondary | ICD-10-CM

## 2022-04-10 LAB — GLUCOSE, CAPILLARY
Glucose-Capillary: 162 mg/dL — ABNORMAL HIGH (ref 70–99)
Glucose-Capillary: 159 mg/dL — ABNORMAL HIGH (ref 70–99)

## 2022-04-10 NOTE — Progress Notes (Signed)
CINCH, ORMOND ([id number]) Visit Report for 04/10/2022 Arrival Information Details Patient Name: [name], [name]. Date of Service: 04/10/2022 8:00 AM Medical Record Number: [id number] Patient Account Number: [id number] Date of Birth/Sex: 12-02-1960 (61 y.o. M) Treating RN: Carlene Coria Primary Care Erlin Gardella: [name] Other Clinician: [name] Referring Airika Alkhatib: [name] Treating Twain Stenseth/Extender: Yaakov Guthrie in Treatment: 10 Visit Information History Since Last Visit Added or deleted any medications: No Patient Arrived: Knee Scooter Any new allergies or adverse reactions: No Arrival Time: 07:55 Had a fall or experienced change in No Accompanied By: self activities of daily living that may affect Transfer Assistance: None risk of falls: Patient Identification Verified: Yes Signs or symptoms of abuse/neglect since last visito No Secondary Verification Process Completed: Yes Hospitalized since last visit: No Patient Requires Transmission-Based Precautions: No Implantable device outside of the clinic excluding No Patient Has Alerts: No cellular tissue based products placed in the center since last visit: Pain Present Now: No Electronic Signature(s) Signed: 04/10/2022 11:29:35 AM By: [name] RCP, RRT, CHT Entered By: [name] on 04/10/2022 09:00:50 [name] ([id number]) -------------------------------------------------------------------------------- Encounter Discharge Information Details Patient Name: [name]. Date of Service: 04/10/2022 8:00 AM Medical Record Number: [id number] Patient Account Number: [id number] Date of Birth/Sex: February 17, 1961 (61 y.o. M) Treating RN: Carlene Coria Primary Care Haneefah Venturini: [name] Other Clinician: [name] Referring Latrease Kunde: [name] Treating Darianna Amy/Extender: Yaakov Guthrie in Treatment: 10 Encounter Discharge Information  Items Discharge Condition: Stable Ambulatory Status: Knee Scooter Discharge Destination: Home Transportation: Private Auto Accompanied By: self Schedule Follow-up Appointment: Yes Clinical Summary of Care: Notes Patient has an HBO treatment scheduled on 04/11/22 at 08:00 am. Electronic Signature(s) Signed: 04/10/2022 11:29:35 AM By: [name] RCP, RRT, CHT Entered By: [name] on 04/10/2022 11:29:17 [name] ([id number]) -------------------------------------------------------------------------------- Vitals Details Patient Name: [name]. Date of Service: 04/10/2022 8:00 AM Medical Record Number: [id number] Patient Account Number: [id number] Date of Birth/Sex: 01-Jul-1961 (61 y.o. M) Treating RN: Carlene Coria Primary Care Jhania Etherington: [name] Other Clinician: [name] Referring Birdell Frasier: [name] Treating Kameshia Madruga/Extender: Yaakov Guthrie in Treatment: 10 Vital Signs Time Taken: 08:10 Temperature (F): 97.6 Height (in): 70 Pulse (bpm): 72 Weight (lbs): 255 Respiratory Rate (breaths/min): 16 Body Mass Index (BMI): 36.6 Blood Pressure (mmHg): 118/70 Capillary Blood Glucose (mg/dl): 162 Reference Range: 80 - 120 mg / dl Electronic Signature(s) Signed: 04/10/2022 11:29:35 AM By: [name] RCP, RRT, CHT Entered By: [name] on 04/10/2022 09:15:09

## 2022-04-10 NOTE — Progress Notes (Signed)
BRENDT, DIBLE ([id number]) Visit Report for 04/08/2022 HBO Details Patient Name: [name], [name]. Date of Service: 04/08/2022 10:00 AM Medical Record Number: [id number] Patient Account Number: [id number] Date of Birth/Sex: [birth year] (61 y.o. 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 HBO Treatment Details Treatment Number: 33 Patient Type: Outpatient Chamber Type: Monoplace Chamber Serial #: [id number] Treatment Protocol: 2.0 ATA with 90 minutes oxygen, and no air breaks Treatment Details Compression Rate Down: 1.5 psi / minute De-Compression Rate Up: 1.5 psi / minute Compress Tx Pressure Air breaks and breathing periods Decompress Decompress Begins Reached (leave unused spaces blank) Begins Ends Chamber Pressure (ATA) 1 2 - - - - - - 2 1 Clock Time (24 hr) 09:22 09:33 - - - - - - 11:03 11:13 Treatment Length: 111 (minutes) Treatment Segments: 4 Vital Signs Capillary Blood Glucose Reference Range: 80 - 120 mg / dl HBO Diabetic Blood Glucose Intervention Range: <131 mg/dl or >249 mg/dl Time Vitals Blood Respiratory Capillary Blood Glucose Pulse Action Type: Pulse: Temperature: Taken: Pressure: Rate: Glucose (mg/dl): Meter #: Oximetry (%) Taken: Pre 09:14 122/62 72 16 98.3 138 1 none per protocol Treatment Response Treatment Toleration: Well Treatment Completion Treatment Completed without Adverse Event Status: Electronic Signature(s) Signed: 04/08/2022 11:44:15 AM By: [name] RCP, RRT, CHT Signed: 04/09/2022 6:10:21 PM By: Worthy Keeler PA-C Entered By: Stark Jock, Amado Nash on  04/08/2022 11:14:07 [name] ([id number]) -------------------------------------------------------------------------------- HBO Safety Checklist Details Patient Name: [name], [name]. Date of Service: 04/08/2022 10:00 AM Medical Record Number: [id number] Patient Account Number: [id number] Date of Birth/Sex: 04-Nov-1960 (61 y.o. M) Treating RN: Cornell Barman Primary Care Desiree Fleming: [name] Other Clinician: [name] Referring Johnesha Acheampong: [name] Treating Timmie Calix/Extender: Skipper Cliche in Treatment: 9 HBO Safety Checklist Items Safety Checklist Consent Form Signed Patient voided / foley secured and emptied When did you last eato 07:00 am Last dose of injectable or oral agent 07:00 am Ostomy pouch emptied and vented if applicable NA All implantable devices assessed, documented and approved NA Intravenous access site secured and place NA Valuables secured Linens and cotton and cotton/polyester blend (less than 51% polyester) Personal oil-based products / skin lotions / body lotions removed Wigs or hairpieces removed NA Smoking or tobacco materials removed NA Books / newspapers / magazines / loose paper removed NA Cologne, aftershave, perfume and deodorant removed Jewelry removed (may wrap wedding band) Make-up removed NA Hair care products removed Battery operated devices (external) removed NA Heating patches and chemical warmers removed NA Titanium eyewear removed NA Nail polish cured greater than 10 hours NA Casting material cured greater than 10 hours NA Hearing aids removed NA Loose dentures or partials removed NA Prosthetics have been removed NA Patient demonstrates correct use of air break device (if applicable) Patient concerns have been addressed Patient grounding bracelet on and cord attached to chamber Specifics for Inpatients (complete in addition to above) Medication sheet sent with patient Intravenous medications needed or  due during therapy sent with patient Drainage tubes (e.g. nasogastric tube or chest tube secured and vented) Endotracheal or Tracheotomy tube secured Cuff deflated of air and inflated with saline Airway suctioned Electronic Signature(s) Signed: 04/08/2022 11:44:15 AM By: [name] RCP, RRT,  CHT Entered By: [name] on 04/08/2022 11:07:45

## 2022-04-10 NOTE — Progress Notes (Signed)
NOLAN, TUAZON ([id number]) Visit Report for 04/10/2022 HBO Details Patient Name: [name], [name]. Date of Service: 04/10/2022 8:00 AM Medical Record Number: [id number] Patient Account Number: [id number] Date of Birth/Sex: 07/16/1960 (61 y.o. M) Treating RN: Carlene Coria Primary Care Denecia Brunette: [name] Other Clinician: [name] Referring Ivania Teagarden: [name] Treating Montrell Cessna/Extender: Yaakov Guthrie in Treatment: 10 HBO Treatment Course Details Treatment Course Number: 1 Ordering Jaxton Casale: [name] Total Treatments Ordered: 40 HBO Treatment Start Date: 02/18/2022 HBO Indication: Chronic Refractory Osteomyelitis to Left Ankle and Foot HBO Treatment Details Treatment Number: 35 Patient Type: Outpatient Chamber Type: Monoplace Chamber Serial #: [id number] Treatment Protocol: 2.0 ATA with 90 minutes oxygen, and no air breaks Treatment Details Compression Rate Down: 1.5 psi / minute De-Compression Rate Up: 1.5 psi / minute Compress Tx Pressure Air breaks and breathing periods Decompress Decompress Begins Reached (leave unused spaces blank) Begins Ends Chamber Pressure (ATA) 1 2 - - - - - - 2 1 Clock Time (24 hr) 08:19 08:29 - - - - - - 10:00 10:09 Treatment Length: 110 (minutes) Treatment Segments: 4 Vital Signs Capillary Blood Glucose Reference Range: 80 - 120 mg / dl HBO Diabetic Blood Glucose Intervention Range: <131 mg/dl or >249 mg/dl Time Vitals Blood Respiratory Capillary Blood Glucose Pulse Action Type: Pulse: Temperature: Taken: Pressure: Rate: Glucose (mg/dl): Meter #: Oximetry (%) Taken: Pre 08:10 118/70 72 16 97.6 162 1 none per protocol Post 10:20 124/78 84 16 97.6 159 1 none per protocol Treatment Response Treatment Toleration: Well Treatment Completion Treatment Completed without Adverse Event Status: HBO Attestation I certify that I supervised this HBO treatment in accordance with Medicare guidelines. A trained emergency  response team is readily Yes available per hospital policies and procedures. Continue HBOT as ordered. Yes Electronic Signature(s) Signed: 04/10/2022 3:09:38 PM By: Kalman Shan DO Previous Signature: 04/10/2022 11:29:35 AM Version By: [name] RCP, RRT, CHT Entered By: Kalman Shan on 04/10/2022 13:24:52 [name] ([id number]) -------------------------------------------------------------------------------- HBO Safety Checklist Details Patient Name: [name]. Date of Service: 04/10/2022 8:00 AM Medical Record Number: [id number] Patient Account Number: [id number] Date of Birth/Sex: Jul 15, 1960 (61 y.o. M) Treating RN: Carlene Coria Primary Care Tennyson Wacha: [name] Other Clinician: [name] Referring Eternity Dexter: [name] Treating Teisha Trowbridge/Extender: Yaakov Guthrie in Treatment: 10 HBO Safety Checklist Items Safety Checklist Consent Form Signed Patient voided / foley secured and emptied When did you last eato 07:00 am Last dose of injectable or oral agent 04/09/22 pm Ostomy pouch emptied and vented if applicable NA All implantable devices assessed, documented and approved NA Intravenous access site secured and place NA Valuables secured Linens and cotton and cotton/polyester blend (less than 51% polyester) Personal oil-based products / skin lotions / body lotions removed Wigs or hairpieces removed NA Smoking or tobacco materials removed NA Books / newspapers / magazines / loose paper removed NA Cologne, aftershave, perfume and deodorant removed Jewelry removed (may wrap wedding band) Make-up removed NA Hair care products removed Battery operated devices (external) removed NA Heating patches and chemical warmers removed NA Titanium eyewear removed NA Nail polish cured greater than 10 hours NA Casting material cured greater than 10 hours NA Hearing aids removed NA Loose dentures or partials  removed NA Prosthetics have been removed NA Patient demonstrates correct use of air break device (if applicable) Patient concerns have been addressed Patient grounding bracelet on and cord attached to chamber Specifics for Inpatients (complete in addition to above) Medication sheet sent with patient Intravenous medications needed  or due during therapy sent with patient Drainage tubes (e.g. nasogastric tube or chest tube secured and vented) Endotracheal or Tracheotomy tube secured Cuff deflated of air and inflated with saline Airway suctioned Electronic Signature(s) Signed: 04/10/2022 11:29:35 AM By: [name] RCP, RRT, CHT Entered By: [name] on 04/10/2022 09:16:18

## 2022-04-10 NOTE — Progress Notes (Signed)
CAVION, FAIOLA ([id number]) Visit Report for 04/09/2022 HBO Details Patient Name: [name], [name]. Date of Service: 04/09/2022 8:00 AM Medical Record Number: [id number] Patient Account Number: [id number] Date of Birth/Sex: 02/27/1961 (61 y.o. M) Treating RN: Cornell Barman Primary Care Czarina Gingras: [name] Other Clinician: [name] Referring Xena Propst: [name] Treating Trenyce Loera/Extender: Skipper Cliche in Treatment: 10 HBO Treatment Course Details Treatment Course Number: 1 Ordering Margit Batte: [name] Total Treatments Ordered: 40 HBO Treatment Start Date: 02/18/2022 HBO Indication: Chronic Refractory Osteomyelitis to Left Ankle and Foot HBO Treatment Details Treatment Number: 34 Patient Type: Outpatient Chamber Type: Monoplace Chamber Serial #: [id number] Treatment Protocol: 2.0 ATA with 90 minutes oxygen, and no air breaks Treatment Details Compression Rate Down: 1.5 psi / minute De-Compression Rate Up: 1.5 psi / minute Compress Tx Pressure Air breaks and breathing periods Decompress Decompress Begins Reached (leave unused spaces blank) Begins Ends Chamber Pressure (ATA) 1 2 - - - - - - 2 1 Clock Time (24 hr) 08:15 08:25 - - - - - - 09:55 10:05 Treatment Length: 110 (minutes) Treatment Segments: 4 Vital Signs Capillary Blood Glucose Reference Range: 80 - 120 mg / dl HBO Diabetic Blood Glucose Intervention Range: <131 mg/dl or >249 mg/dl Time Vitals Blood Respiratory Capillary Blood Glucose Pulse Action Type: Pulse: Temperature: Taken: Pressure: Rate: Glucose (mg/dl): Meter #: Oximetry (%) Taken: Pre 08:00 112/64 90 16 97.8 203 1 none per protocol Post 10:18 128/68 72 16 97.7 251 1 none per protocol Treatment Response Treatment Toleration: Well Treatment Completion Treatment Completed without Adverse Event Status: Electronic Signature(s) Signed: 04/09/2022 11:23:07 AM By: [name] RCP, RRT, CHT Signed: 04/09/2022 6:10:21 PM By:  Worthy Keeler PA-C Entered By: [name] on 04/09/2022 10:33:08 [name] ([id number]) -------------------------------------------------------------------------------- HBO Safety Checklist Details Patient Name: [name], [name]. Date of Service: 04/09/2022 8:00 AM Medical Record Number: [id number] Patient Account Number: [id number] Date of Birth/Sex: [birth year] (61 y.o. M) Treating RN: Cornell Barman Primary Care Jemar Paulsen: [name] Other Clinician: [name] Referring Dehlia Kilner: [name] Treating Eola Waldrep/Extender: Skipper Cliche in Treatment: 10 HBO Safety Checklist Items Safety Checklist Consent Form Signed Patient voided / foley secured and emptied When did you last eato 07:00 am Last dose of injectable or oral agent 07:00 am Ostomy pouch emptied and vented if applicable NA All implantable devices assessed, documented and approved NA Intravenous access site secured and place NA Valuables secured Linens and cotton and cotton/polyester blend (less than 51% polyester) Personal oil-based products / skin lotions / body lotions removed Wigs or hairpieces removed NA Smoking or tobacco materials removed NA Books / newspapers / magazines / loose paper removed NA Cologne, aftershave, perfume and deodorant removed Jewelry removed (may wrap wedding band) Make-up removed NA Hair care products removed Battery operated devices (external) removed NA Heating patches and chemical warmers removed NA Titanium eyewear removed NA Nail polish cured greater than 10 hours NA Casting material cured greater than 10 hours NA Hearing aids removed NA Loose dentures or partials removed NA Prosthetics have been removed NA Patient demonstrates correct use of air break device (if applicable) Patient concerns have been addressed Patient grounding bracelet on and cord attached to chamber Specifics for Inpatients (complete in addition  to above) Medication sheet sent with patient Intravenous medications needed or due during therapy sent with patient Drainage tubes (e.g. nasogastric tube or chest tube secured and vented) Endotracheal or Tracheotomy tube secured Cuff deflated of air and inflated with saline Airway suctioned Electronic  Signature(s) Signed: 04/09/2022 11:23:07 AM By: [name] RCP, RRT, CHT Entered By: [name] on 04/09/2022 08:20:12

## 2022-04-11 ENCOUNTER — Encounter: Payer: 59 | Admitting: Physician Assistant

## 2022-04-11 DIAGNOSIS — E11621 Type 2 diabetes mellitus with foot ulcer: Secondary | ICD-10-CM | POA: Diagnosis not present

## 2022-04-11 LAB — GLUCOSE, CAPILLARY
Glucose-Capillary: 219 mg/dL — ABNORMAL HIGH (ref 70–99)
Glucose-Capillary: 190 mg/dL — ABNORMAL HIGH (ref 70–99)

## 2022-04-11 NOTE — Progress Notes (Signed)
DENSIL, OTTEY ([id number]) Visit Report for 04/11/2022 HBO Details Patient Name: [name], [name]. Date of Service: 04/11/2022 8:00 AM Medical Record Number: [id number] Patient Account Number: [id number] Date of Birth/Sex: May 26, 1961 (61 y.o. 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 and Foot HBO Treatment Details Treatment Number: 36 Patient Type: Outpatient Chamber Type: Monoplace Chamber Serial #: [id number] Treatment Protocol: 2.0 ATA with 90 minutes oxygen, and no air breaks Treatment Details Compression Rate Down: 1.5 psi / minute De-Compression Rate Up: 1.5 psi / minute Compress Tx Pressure Air breaks and breathing periods Decompress Decompress Begins Reached (leave unused spaces blank) Begins Ends Chamber Pressure (ATA) 1 2 - - - - - - 2 1 Clock Time (24 hr) 08:20 08:31 - - - - - - 10:01 10:11 Treatment Length: 111 (minutes) Treatment Segments: 4 Vital Signs Capillary Blood Glucose Reference Range: 80 - 120 mg / dl HBO Diabetic Blood Glucose Intervention Range: <131 mg/dl or >249 mg/dl Time Vitals Blood Respiratory Capillary Blood Glucose Pulse Action Type: Pulse: Temperature: Taken: Pressure: Rate: Glucose (mg/dl): Meter #: Oximetry (%) Taken: Pre 08:17 122/70 72 16 97.8 219 1 none per protocol Post 10:25 126/74 72 16 97.9 190 1 none per protocol Treatment Response Treatment Toleration: Well Treatment Completion Treatment Completed without Adverse Event Status: Electronic Signature(s) Signed: 04/11/2022 11:46:15 AM By: [name] RCP, RRT, CHT Signed: 04/11/2022 5:03:23 PM By:  Worthy Keeler PA-C Entered By: [name] on 04/11/2022 11:44:37 [name] ([id number]) -------------------------------------------------------------------------------- HBO Safety Checklist Details Patient Name: [name]. Date of Service: 04/11/2022 8:00 AM Medical Record Number: [id number] Patient Account Number: [id number] Date of Birth/Sex: [birth year] (61 y.o. M) Treating RN: Carlene Coria Primary Care Rosanne Wohlfarth: [name] Other Clinician: [name] Referring Tayden Nichelson: [name] Treating Kenlea Woodell/Extender: Skipper Cliche in Treatment: 10 HBO Safety Checklist Items Safety Checklist Consent Form Signed Patient voided / foley secured and emptied When did you last eato 07:00 am Last dose of injectable or oral agent 04/10/22 pm Ostomy pouch emptied and vented if applicable NA All implantable devices assessed, documented and approved NA Intravenous access site secured and place NA Valuables secured Linens and cotton and cotton/polyester blend (less than 51% polyester) Personal oil-based products / skin lotions / body lotions removed Wigs or hairpieces removed NA Smoking or tobacco materials removed NA Books / newspapers / magazines / loose paper removed NA Cologne, aftershave, perfume and deodorant removed Jewelry removed (may wrap wedding band) Make-up removed NA Hair care products removed Battery operated devices (external) removed NA Heating patches and chemical warmers removed NA Titanium eyewear removed NA Nail polish cured greater than 10 hours NA Casting material cured greater than 10 hours NA Hearing aids removed NA Loose dentures or partials removed NA Prosthetics have been removed NA Patient demonstrates correct use of air break device (if applicable) Patient concerns have been addressed Patient grounding bracelet on and cord attached to chamber Specifics for Inpatients (complete in addition  to above) Medication sheet sent with patient Intravenous medications needed or due during therapy sent with patient Drainage tubes (e.g. nasogastric tube or chest tube secured and vented) Endotracheal or Tracheotomy tube secured Cuff deflated of air and inflated with saline Airway suctioned Electronic  Signature(s) Signed: 04/11/2022 11:46:15 AM By: [name] RCP, RRT, CHT Entered By: [name] on 04/11/2022 08:43:14

## 2022-04-11 NOTE — Progress Notes (Signed)
HAMP, MORELAND ([id number]) Visit Report for 04/11/2022 Arrival Information Details Patient Name: [name], [name]. Date of Service: 04/11/2022 8:00 AM Medical Record Number: [id number] Patient Account Number: [id number] Date of Birth/Sex: Oct 05, 1960 (61 y.o. M) Treating RN: Carlene Coria Primary Care Nova Schmuhl: [name] Other Clinician: [name] Referring Alice Burnside: [name] Treating Calliope Delangel/Extender: Skipper Cliche in Treatment: 10 Visit Information History Since Last Visit Added or deleted any medications: No Patient Arrived: Knee Scooter Any new allergies or adverse reactions: No Arrival Time: 08:40 Had a fall or experienced change in No Accompanied By: self activities of daily living that may affect Transfer Assistance: None risk of falls: Patient Identification Verified: Yes Signs or symptoms of abuse/neglect since last visito No Secondary Verification Process Completed: Yes Hospitalized since last visit: No Patient Requires Transmission-Based Precautions: No Implantable device outside of the clinic excluding No Patient Has Alerts: No cellular tissue based products placed in the center since last visit: Has Dressing in Place as Prescribed: Yes Has Footwear/Offloading in Place as Prescribed: Yes Left: Removable Cast Walker/Walking Boot Pain Present Now: No Electronic Signature(s) Signed: 04/11/2022 11:46:15 AM By: [name] RCP, RRT, CHT Entered By: [name] on 04/11/2022 08:41:18 [name] ([id number]) -------------------------------------------------------------------------------- Encounter Discharge Information Details Patient Name: [name]. Date of Service: 04/11/2022 8:00 AM Medical Record Number: [id number] Patient Account Number: [id number] Date of Birth/Sex: 1961-02-16 (61 y.o. M) Treating RN: Carlene Coria Primary Care Tawyna Pellot: [name] Other Clinician: [name] Referring  Kayci Belleville: [name] Treating Mada Sadik/Extender: Skipper Cliche in Treatment: 10 Encounter Discharge Information Items Discharge Condition: Stable Ambulatory Status: Knee Scooter Discharge Destination: Home Transportation: Private Auto Accompanied By: self Schedule Follow-up Appointment: Yes Clinical Summary of Care: Notes Patient has an HBO treatment scheduled on 04/12/22 at 08:00 am. Electronic Signature(s) Signed: 04/11/2022 11:46:15 AM By: [name] RCP, RRT, CHT Entered By: [name] on 04/11/2022 11:45:57 [name] ([id number]) -------------------------------------------------------------------------------- Vitals Details Patient Name: [name]. Date of Service: 04/11/2022 8:00 AM Medical Record Number: [id number] Patient Account Number: [id number] Date of Birth/Sex: 21-Sep-1960 (61 y.o. M) Treating RN: Carlene Coria Primary Care Ijeoma Loor: [name] Other Clinician: [name] Referring Jaydn Moscato: [name] Treating Khaleah Duer/Extender: Skipper Cliche in Treatment: 10 Vital Signs Time Taken: 08:17 Temperature (F): 97.8 Height (in): 70 Pulse (bpm): 72 Weight (lbs): 255 Respiratory Rate (breaths/min): 16 Body Mass Index (BMI): 36.6 Blood Pressure (mmHg): 122/70 Capillary Blood Glucose (mg/dl): 219 Reference Range: 80 - 120 mg / dl Electronic Signature(s) Signed: 04/11/2022 11:46:15 AM By: [name] RCP, RRT, CHT Entered By: [name] on 04/11/2022 08:42:13

## 2022-04-12 ENCOUNTER — Encounter: Payer: 59 | Admitting: Physician Assistant

## 2022-04-12 DIAGNOSIS — E11621 Type 2 diabetes mellitus with foot ulcer: Secondary | ICD-10-CM | POA: Diagnosis not present

## 2022-04-12 LAB — GLUCOSE, CAPILLARY
Glucose-Capillary: 151 mg/dL — ABNORMAL HIGH (ref 70–99)
Glucose-Capillary: 167 mg/dL — ABNORMAL HIGH (ref 70–99)

## 2022-04-12 NOTE — Progress Notes (Signed)
RASHEEN, BELLS ([id number]) Visit Report for 04/12/2022 Arrival Information Details Patient Name: BRODRICK, CURRAN. Date of Service: 04/12/2022 8:00 AM Medical Record Number: [id number] Patient Account Number: [id number] Date of Birth/Sex: 01-19-1961 (61 y.o. M) Treating RN: Carlene Coria Primary Care Christina Gintz: [name] Other Clinician: [name] Referring Latrelle Bazar: [name] Treating Laddie Naeem/Extender: Skipper Cliche in Treatment: 10 Visit Information History Since Last Visit Added or deleted any medications: No Patient Arrived: Knee Scooter Any new allergies or adverse reactions: No Arrival Time: 07:55 Had a fall or experienced change in No Accompanied By: self activities of daily living that may affect Transfer Assistance: None risk of falls: Patient Identification Verified: Yes Signs or symptoms of abuse/neglect since last visito No Secondary Verification Process Completed: Yes Hospitalized since last visit: No Patient Requires Transmission-Based Precautions: No Implantable device outside of the clinic excluding No Patient Has Alerts: No cellular tissue based products placed in the center since last visit: Has Dressing in Place as Prescribed: Yes Has Footwear/Offloading in Place as Prescribed: Yes Left: Removable Cast Walker/Walking Boot Pain Present Now: No Electronic Signature(s) Signed: 04/12/2022 11:59:41 AM By: [name] RCP, RRT, CHT Entered By: [name] on 04/12/2022 08:40:03 [name] ([id number]) -------------------------------------------------------------------------------- Encounter Discharge Information Details Patient Name: [name]. Date of Service: 04/12/2022 8:00 AM Medical Record Number: [id number] Patient Account Number: [id number] Date of Birth/Sex: 02/10/1961 (61 y.o. M) Treating RN: Carlene Coria Primary Care Peightyn Roberson: [name] Other Clinician: [name] Referring  Haven Pylant: [name] Treating Iban Utz/Extender: Skipper Cliche in Treatment: 10 Encounter Discharge Information Items Discharge Condition: Stable Ambulatory Status: Knee Scooter Discharge Destination: Home Transportation: Private Auto Accompanied By: self Schedule Follow-up Appointment: Yes Clinical Summary of Care: Notes Patient has an HBO treatment scheduled on 04/15/22 at 08:00 am. Electronic Signature(s) Signed: 04/12/2022 11:59:41 AM By: [name] RCP, RRT, CHT Entered By: [name] on 04/12/2022 11:57:07 [name] ([id number]) -------------------------------------------------------------------------------- Vitals Details Patient Name: [name]. Date of Service: 04/12/2022 8:00 AM Medical Record Number: [id number] Patient Account Number: [id number] Date of Birth/Sex: 03-12-1961 (60 y.o. M) Treating RN: Carlene Coria Primary Care Floreine Kingdon: [name] Other Clinician: [name] Referring Melesio Madara: [name] Treating Serah Nicoletti/Extender: Skipper Cliche in Treatment: 10 Vital Signs Time Taken: 08:09 Temperature (F): 97.8 Height (in): 70 Pulse (bpm): 60 Weight (lbs): 255 Respiratory Rate (breaths/min): 16 Body Mass Index (BMI): 36.6 Blood Pressure (mmHg): 128/68 Capillary Blood Glucose (mg/dl): 151 Reference Range: 80 - 120 mg / dl Electronic Signature(s) Signed: 04/12/2022 11:59:41 AM By: [name] RCP, RRT, CHT Entered By: Stark Jock, Amado Nash on 04/12/2022 08:41:09

## 2022-04-12 NOTE — Progress Notes (Signed)
SYMEON, PULEO ([id number]) Visit Report for 04/12/2022 HBO Details Patient Name: [name], [name]. Date of Service: 04/12/2022 8:00 AM Medical Record Number: [id number] Patient Account Number: [id number] Date of Birth/Sex: 1960-12-07 (61 y.o. M) Treating RN: Carlene Coria Primary Care Achol Azpeitia: [name] Other Clinician: [name] Referring Cledis Sohn: [name] Treating Marnisha Stampley/Extender: Skipper Cliche in Treatment: 10 HBO Treatment Course Details Treatment Course Number: 1 Ordering Logyn Kendrick: [name] Total Treatments Ordered: 40 HBO Treatment Start Date: 02/18/2022 HBO Indication: Chronic Refractory Osteomyelitis to Left Ankle and Foot HBO Treatment Details Treatment Number: 37 Patient Type: Outpatient Chamber Type: Monoplace Chamber Serial #: [id number] Treatment Protocol: 2.0 ATA with 90 minutes oxygen, and no air breaks Treatment Details Compression Rate Down: 1.5 psi / minute De-Compression Rate Up: 1.5 psi / minute Compress Tx Pressure Air breaks and breathing periods Decompress Decompress Begins Reached (leave unused spaces blank) Begins Ends Chamber Pressure (ATA) 1 2 - - - - - - 2 1 Clock Time (24 hr) 08:14 08:24 - - - - - - 09:55 10:05 Treatment Length: 111 (minutes) Treatment Segments: 4 Vital Signs Capillary Blood Glucose Reference Range: 80 - 120 mg / dl HBO Diabetic Blood Glucose Intervention Range: <131 mg/dl or >249 mg/dl Time Vitals Blood Respiratory Capillary Blood Glucose Pulse Action Type: Pulse: Temperature: Taken: Pressure: Rate: Glucose (mg/dl): Meter #: Oximetry (%) Taken: Pre 08:09 128/68 60 16 97.8 151 1 none per protocol Post 10:17 130/70 84 16 97.8 167 1 none per protocol Treatment Response Treatment Toleration: Well Treatment Completion Treatment Completed without Adverse Event Status: Electronic Signature(s) Signed: 04/12/2022 11:59:41 AM By: [name] RCP, RRT, CHT Signed: 04/12/2022 12:12:06 PM By:  Worthy Keeler PA-C Entered By: [name] on 04/12/2022 11:56:10 [name] ([id number]) -------------------------------------------------------------------------------- HBO Safety Checklist Details Patient Name: [name]. Date of Service: 04/12/2022 8:00 AM Medical Record Number: [id number] Patient Account Number: [id number] Date of Birth/Sex: May 15, 1961 (61 y.o. M) Treating RN: Carlene Coria Primary Care Solei Wubben: [name] Other Clinician: [name] Referring Caldwell Kronenberger: [name] Treating Misako Roeder/Extender: Skipper Cliche in Treatment: 10 HBO Safety Checklist Items Safety Checklist Consent Form Signed Patient voided / foley secured and emptied When did you last eato 07:00 am Last dose of injectable or oral agent 04/11/22 Ostomy pouch emptied and vented if applicable NA All implantable devices assessed, documented and approved NA Intravenous access site secured and place NA Valuables secured Linens and cotton and cotton/polyester blend (less than 51% polyester) Personal oil-based products / skin lotions / body lotions removed Wigs or hairpieces removed NA Smoking or tobacco materials removed NA Books / newspapers / magazines / loose paper removed NA Cologne, aftershave, perfume and deodorant removed Jewelry removed (may wrap wedding band) Make-up removed NA Hair care products removed Battery operated devices (external) removed NA Heating patches and chemical warmers removed NA Titanium eyewear removed NA Nail polish cured greater than 10 hours NA Casting material cured greater than 10 hours NA Hearing aids removed NA Loose dentures or partials removed NA Prosthetics have been removed NA Patient demonstrates correct use of air break device (if applicable) Patient concerns have been addressed Patient grounding bracelet on and cord attached to chamber Specifics for Inpatients (complete in addition  to above) Medication sheet sent with patient Intravenous medications needed or due during therapy sent with patient Drainage tubes (e.g. nasogastric tube or chest tube secured and vented) Endotracheal or Tracheotomy tube secured Cuff deflated of air and inflated with saline Airway suctioned Electronic Signature(s)  Signed: 04/12/2022 11:59:41 AM By: [name] RCP, RRT, CHT Entered By: [name] on 04/12/2022 08:42:17

## 2022-04-15 ENCOUNTER — Encounter: Payer: 59 | Admitting: Physician Assistant

## 2022-04-15 DIAGNOSIS — E11621 Type 2 diabetes mellitus with foot ulcer: Secondary | ICD-10-CM | POA: Diagnosis not present

## 2022-04-15 LAB — GLUCOSE, CAPILLARY
Glucose-Capillary: 160 mg/dL — ABNORMAL HIGH (ref 70–99)
Glucose-Capillary: 152 mg/dL — ABNORMAL HIGH (ref 70–99)

## 2022-04-15 NOTE — Progress Notes (Signed)
[name] ([id number]) [id number] Page 1 of 2 Visit Report for 04/15/2022 Arrival Information Details Patient Name: Date of Service: [name] RO LD F. 04/15/2022 8:00 A M Medical Record Number: [id number] Patient Account Number: [id number] Date of Birth/Sex: Treating RN: 08-03-1960 (61 y.o. [name], [name] Primary Care [name]: [name] Other Clinician: Jacqulyn [name] Referring [name]: Treating [name]/Extender: [name] in Treatment: 10 Visit Information History Since Last Visit Added or deleted any medications: No Patient Arrived: Knee Scooter Any new allergies or adverse reactions: No Arrival Time: 08:10 Had a fall or experienced change in No Accompanied By: self activities of daily living that may affect Transfer Assistance: None risk of falls: Patient Identification Verified: Yes Signs or symptoms of abuse/neglect since last No Secondary Verification Process Completed: Yes visito Patient Requires Transmission-Based Precautions: No Hospitalized since last visit: No Patient Has Alerts: No Implantable device outside of the clinic No excluding cellular tissue based products placed in the center since last visit: Has Dressing in Place as Prescribed: Yes Has Footwear/Offloading in Place as Yes Prescribed: Left: Removable Cast Walker/Walking Boot Pain Present Now: No Electronic Signature(s) Signed: 04/15/2022 9:42:38 AM By: [name] RCP, RRT CHT , , Entered By: [name] on 04/15/2022 09:11:39 , -------------------------------------------------------------------------------- Encounter Discharge Information Details Patient Name: Date of Service: [name] RO LD F. 04/15/2022 8:00 A M Medical Record Number: [id number] Patient Account Number: [id number] Date of Birth/Sex: Treating RN: 03-31-1961 (61 y.o. [name], [name] Primary Care Ilayda Toda: [name] Other Clinician:  Jacqulyn [name] Referring Persia Lintner: Treating Jamelyn Bovard/Extender: [name] in Treatment: 10 Encounter Discharge Information Items Discharge Condition: Stable Ambulatory Status: Knee Scooter Discharge Destination: Home [name], [name] ([id number]) [id number] Page 2 of 2 Transportation: Private Auto Accompanied By: self Schedule Follow-up Appointment: Yes Clinical Summary of Care: Notes Patient has an HBO treatment scheduled on 04/16/22 at 8:45 am. Electronic Signature(s) Signed: 04/15/2022 9:42:38 AM By: [name] RCP, RRT CHT , , Entered By: [name] on 04/15/2022 12:39:51 , -------------------------------------------------------------------------------- Vitals Details Patient Name: Date of Service: [name] RO LD F. 04/15/2022 8:00 A M Medical Record Number: [id number] Patient Account Number: [id number] Date of Birth/Sex: Treating RN: 26-Sep-1960 (61 y.o. [name], [name] Primary Care Lavi Sheehan: [name] Other Clinician: Jacqulyn [name] Referring Mahalie Kanner: Treating Bobbie Virden/Extender: [name] in Treatment: 10 Vital Signs Time Taken: 08:18 Temperature (F): 97.9 Height (in): 70 Pulse (bpm): 78 Weight (lbs): 255 Respiratory Rate (breaths/min): 16 Body Mass Index (BMI): 36.6 Blood Pressure (mmHg): 122/72 Capillary Blood Glucose (mg/dl): 160 Reference Range: 80 - 120 mg / dl Electronic Signature(s) Signed: 04/15/2022 9:42:38 AM By: [name] RCP, RRT CHT , , Entered By: [name] on 04/15/2022 09:12:14 ,

## 2022-04-15 NOTE — Progress Notes (Signed)
[name] ([id number]) [id number] Page 1 of 2 Visit Report for 04/15/2022 HBO Details Patient Name: Date of Service: [name] RO LD F. 04/15/2022 8:00 A M Medical Record Number: [id number] Patient Account Number: [id number] Date of Birth/Sex: Treating RN: 07/24/1960 (61 y.o. [name], [name] Primary Care Sharetha Newson: [name] Other Clinician: [name] Referring Sylver Vantassell: Treating Junior Huezo/Extender: [name] in Treatment: 10 HBO Treatment Course Details Treatment Course Number: 1 Ordering Justino Boze: [name] T Treatments Ordered: otal 40 HBO Treatment Start Date: 02/18/2022 HBO Indication: Chronic Refractory Osteomyelitis to Left Ankle and Foot HBO Treatment Details Treatment Number: 38 Patient Type: Outpatient Chamber Type: Monoplace Chamber Serial #: [id number] Treatment Protocol: 2.0 ATA with 90 minutes oxygen, and no air breaks Treatment Details Compression Rate Down: 1.5 psi / minute De-Compression Rate Up: 1.5 psi / minute Air breaks and breathing Decompress Decompress Compress Tx Pressure Begins Reached periods Begins Ends (leave unused spaces blank) Chamber Pressure (ATA 1 2 ------2 1 ) Clock Time (24 hr) 08:30 08:40 - - - - - - 10:10 10:20 Treatment Length: 110 (minutes) Treatment Segments: 4 Vital Signs Capillary Blood Glucose Reference Range: 80 - 120 mg / dl HBO Diabetic Blood Glucose Intervention Range: <131 mg/dl or >249 mg/dl Type: Time Vitals Blood Respiratory Capillary Blood Glucose Pulse Action Pulse: Temperature: Taken: Pressure: Rate: Glucose (mg/dl): Meter #: Oximetry (%) Taken: Pre 08:18 122/72 78 16 97.9 160 1 none per protocol Post 10:29 124/78 72 16 97.8 152 1 none per protocol Treatment Response Treatment Toleration: Well Treatment Completion Status: Treatment Completed without Adverse Event HBO Attestation I certify that I supervised this HBO treatment in accordance with  Medicare guidelines. A trained emergency response team is readily available per Yes hospital policies and procedures. Continue HBOT as ordered. Yes Electronic Signature(s) Signed: 04/15/2022 5:02:09 PM By: Worthy Keeler PA-C Previous Signature: 04/15/2022 9:42:38 AM Version By: [name] RCP, RRT CHT , , Entered By: Worthy Keeler on 04/15/2022 17:02:09 [name] ([id number]) [id number] Page 2 of 2 -------------------------------------------------------------------------------- HBO Safety Checklist Details Patient Name: Date of Service: [name] RO LD F. 04/15/2022 8:00 A M Medical Record Number: [id number] Patient Account Number: [id number] Date of Birth/Sex: Treating RN: Jun 01, 1961 (61 y.o. [name], [name] Primary Care Sherrel Ploch: [name] Other Clinician: [name] Referring Dondra Rhett: Treating Bridney Guadarrama/Extender: [name] in Treatment: 10 HBO Safety Checklist Items Safety Checklist Consent Form Signed Patient voided / foley secured and emptied When did you last eato 07:00 am Last dose of injectable or oral agent 04/14/22 Ostomy pouch emptied and vented if applicable NA All implantable devices assessed, documented and approved NA Intravenous access site secured and place NA Valuables secured Linens and cotton and cotton/polyester blend (less than 51% polyester) Personal oil-based products / skin lotions / body lotions removed Wigs or hairpieces removed NA Smoking or tobacco materials removed NA Books / newspapers / magazines / loose paper removed NA Cologne, aftershave, perfume and deodorant removed Jewelry removed (may wrap wedding band) Make-up removed NA Hair care products removed Battery operated devices (external) removed NA Heating patches and chemical warmers removed NA Titanium eyewear removed NA Nail polish cured greater than 10 hours NA Casting material cured greater than  10 hours NA Hearing aids removed NA Loose dentures or partials removed NA Prosthetics have been removed NA Patient demonstrates correct use of air break device (if applicable) Patient concerns have been addressed Patient grounding bracelet on and cord attached to  chamber Specifics for Inpatients (complete in addition to above) Medication sheet sent with patient Intravenous medications needed or due during therapy sent with patient Drainage tubes (e.g. nasogastric tube or chest tube secured and vented) Endotracheal or Tracheotomy tube secured Cuff deflated of air and inflated with saline Airway suctioned Electronic Signature(s) Signed: 04/15/2022 9:42:38 AM By: [name] RCP, RRT CHT , , Entered By: [name] on 04/15/2022 09:13:23 ,

## 2022-04-15 NOTE — Progress Notes (Signed)
VONTE, ROSSIN ([id number]) [id number] Page 1 of 2 Visit Report for 04/15/2022 Problem List Details Patient Name: Date of Service: [name] RO LD F. 04/15/2022 8:00 A M Medical Record Number: [id number] Patient Account Number: [id number] Date of Birth/Sex: Treating RN: [birth year] (61 y.o. 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 04/15/2022 Medical Record Number: [id number] Patient Account Number: [id number] CODIE, KROGH ([id number]) [id number] Page 2 of 2 Date of Birth/Sex: Treating RN: [birth year] (61 y.o. 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  bone T81.31XA Disruption of external operation (surgical) wound, not elsewhere classified, initial encounter Facility Procedures : CPT4 Code: [id number] Description: (Facility Use Only) HBOT full body chamber, 30mn , Modifier: Quantity: 4 Physician Procedures : CPT4 Code Description Modifier [id number]- WC PHYS HYPERBARIC OXYGEN THERAPY ICD-10 Diagnosis Description M86.372 Chronic multifocal osteomyelitis, left ankle and foot L97.524 Non-pressure chronic ulcer of other part of left foot with necrosis of  bone E11.621 Type 2 diabetes mellitus with foot ulcer Quantity: 1 Electronic Signature(s) Signed: 04/15/2022 2:02:15 PM By: SWorthy KeelerPA-C Previous Signature: 04/15/2022 9:42:38 AM Version By: WEnedina FinnerRCP, RRT CHT , , Entered By: SWorthy Keeleron 04/15/2022 17:02:14

## 2022-04-16 ENCOUNTER — Encounter: Payer: 59 | Admitting: Physician Assistant

## 2022-04-16 DIAGNOSIS — E11621 Type 2 diabetes mellitus with foot ulcer: Secondary | ICD-10-CM | POA: Diagnosis not present

## 2022-04-16 LAB — GLUCOSE, CAPILLARY
Glucose-Capillary: 165 mg/dL — ABNORMAL HIGH (ref 70–99)
Glucose-Capillary: 120 mg/dL — ABNORMAL HIGH (ref 70–99)

## 2022-04-16 NOTE — Progress Notes (Signed)
RAPHEAL, MASSO ([id number]) [id number] Page 1 of 2 Visit Report for 04/16/2022 Arrival Information Details Patient Name: Date of Service: [name] RO LD F. 04/16/2022 8:45 A M Medical Record Number: [id number] Patient Account Number: [id number] Date of Birth/Sex: Treating RN: 1961/02/26 (61 y.o. [name], [name] Primary Care Alysa Duca: [name] Other Clinician: [name] Referring Alexandrina Fiorini: Treating Dwayne Begay/Extender: [name] in Treatment: 11 Visit Information History Since Last Visit Added or deleted any medications: No Patient Arrived: Knee Scooter Any new allergies or adverse reactions: No Arrival Time: 08:52 Had a fall or experienced change in No Accompanied By: self activities of daily living that may affect Transfer Assistance: None risk of falls: Patient Requires Transmission-Based Precautions: No Signs or symptoms of abuse/neglect since last No Patient Has Alerts: No visito Hospitalized since last visit: No Implantable device outside of the clinic No excluding cellular tissue based products placed in the center since last visit: Has Dressing in Place as Prescribed: Yes Has Footwear/Offloading in Place as Yes Prescribed: Left: Removable Cast Walker/Walking Boot Pain Present Now: No Electronic Signature(s) Signed: 04/16/2022 11:11:24 AM By: [name] RCP, RRT CHT , , Entered By: [name] on 04/16/2022 09:02:06 , -------------------------------------------------------------------------------- Encounter Discharge Information Details Patient Name: Date of Service: [name] RO LD F. 04/16/2022 8:45 A M Medical Record Number: [id number] Patient Account Number: [id number] Date of Birth/Sex: Treating RN: 03/20/1961 (61 y.o. [name], [name] Primary Care Akesha Uresti: [name] Other Clinician: [name] Referring Seabron Iannello: Treating Niomi Valent/Extender: [name] in Treatment: 11 Encounter Discharge Information Items Discharge Condition: Stable Ambulatory Status: Knee Scooter Discharge Destination: Home ZANDYR, BARNHILL ([id number]) [id number] Page 2 of 2 Transportation: Private Auto Accompanied By: self Schedule Follow-up Appointment: Yes Clinical Summary of Care: Notes Patient has an HBO treatment scheduled on 04/17/22 at 08:00 am. Electronic Signature(s) Signed: 04/16/2022 11:11:24 AM By: [name] RCP, RRT CHT , , Entered By: [name] on 04/16/2022 11:08:26 , -------------------------------------------------------------------------------- Vitals Details Patient Name: Date of Service: [name] RO LD F. 04/16/2022 8:45 A M Medical Record Number: [id number] Patient Account Number: [id number] Date of Birth/Sex: Treating RN: 24-Sep-1960 (61 y.o. [name], [name] Primary Care Tunisha Ruland: [name] Other Clinician: [name] Referring Indio Santilli: Treating Ulmer Degen/Extender: [name] in Treatment: 11 Vital Signs Time Taken: 08:17 Temperature (F): 97.8 Height (in): 70 Pulse (bpm): 72 Weight (lbs): 255 Respiratory Rate (breaths/min): 16 Body Mass Index (BMI): 36.6 Blood Pressure (mmHg): 112/66 Capillary Blood Glucose (mg/dl): 165 Reference Range: 80 - 120 mg / dl Electronic Signature(s) Signed: 04/16/2022 11:11:24 AM By: [name] RCP, RRT CHT , , Entered By: [name] on 04/16/2022 09:02:47 ,

## 2022-04-16 NOTE — Progress Notes (Signed)
LAMEL, MCCARLEY ([id number]) [id number] Page 1 of 2 Visit Report for 04/16/2022 Problem List Details Patient Name: Date of Service: [name] [name]. 04/16/2022 8:45 A M Medical Record Number: [id number] Patient Account Number: [id number] Date of Birth/Sex: Treating RN: [birth year] (61 y.o. 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 04/16/2022 Medical Record Number: [id number] Patient Account Number: [id number] SARGON, SCOUTEN ([id number]) [id number] Page 2 of 2 Date of Birth/Sex: Treating RN: 18-Jan-1961 (61 y.o. [name], [name] Primary Care Provider: [name] Other Clinician: [name] Referring Provider: Treating Provider/Extender: [name] in Treatment: 11 Diagnosis Coding ICD-10 Codes Code Description [phone] Chronic multifocal osteomyelitis, left ankle and foot E11.621 Type 2 diabetes mellitus with foot ulcer L97.524 Non-pressure chronic ulcer of other part of left foot with necrosis of  bone T81.31XA Disruption of external operation (surgical) wound, not elsewhere classified, initial encounter Facility Procedures : CPT4 Code: 63149702 Description: (Facility Use Only) HBOT full body chamber, 42mn , Modifier: Quantity: 4 Physician Procedures : CPT4 Code Description Modifier [id number] 50277- WC PHYS HYPERBARIC OXYGEN THERAPY ICD-10 Diagnosis Description M86.372 Chronic multifocal osteomyelitis, left ankle and foot L97.524 Non-pressure chronic ulcer of other part of left foot with necrosis of  bone E11.621 Type 2 diabetes mellitus with foot ulcer Quantity: 1 Electronic Signature(s) Signed: 04/16/2022 3:54:41 PM By: SWorthy KeelerPA-C Previous Signature: 04/16/2022 11:11:24 AM Version By: WEnedina FinnerRCP, RRT CHT , , Entered By: SWorthy Keeleron 04/16/2022 15:54:40

## 2022-04-16 NOTE — Progress Notes (Signed)
[name], [name] ([id number]) [id number] Page 1 of 2 Visit Report for 04/16/2022 HBO Details Patient Name: Date of Service: [name] RO LD F. 04/16/2022 8:45 A M Medical Record Number: [id number] Patient Account Number: [id number] Date of Birth/Sex: Treating RN: March 11, 1961 (61 y.o. [name], [name] Primary Care Oletha Tolson: [name] Other Clinician: [name] Referring Deliana Avalos: Treating Shenae Bonanno/Extender: [name] in Treatment: 11 HBO Treatment Course Details Treatment Course Number: 1 Ordering Gisselle Galvis: [name] T Treatments Ordered: otal 40 HBO Treatment Start Date: 02/18/2022 HBO Indication: Chronic Refractory Osteomyelitis to Left Ankle and Foot HBO Treatment Details Treatment Number: 39 Patient Type: Outpatient Chamber Type: Monoplace Chamber Serial #: [id number] Treatment Protocol: 2.0 ATA with 90 minutes oxygen, and no air breaks Treatment Details Compression Rate Down: 1.5 psi / minute De-Compression Rate Up: 1.5 psi / minute Air breaks and breathing Decompress Decompress Compress Tx Pressure Begins Reached periods Begins Ends (leave unused spaces blank) Chamber Pressure (ATA 1 2 ------2 1 ) Clock Time (24 hr) 08:46 08:56 - - - - - - 10:27 10:37 Treatment Length: 111 (minutes) Treatment Segments: 4 Vital Signs Capillary Blood Glucose Reference Range: 80 - 120 mg / dl HBO Diabetic Blood Glucose Intervention Range: <131 mg/dl or >249 mg/dl Type: Time Vitals Blood Respiratory Capillary Blood Glucose Pulse Action Pulse: Temperature: Taken: Pressure: Rate: Glucose (mg/dl): Meter #: Oximetry (%) Taken: Pre 08:17 112/66 72 16 97.8 165 1 none per protocol Post 10:53 120/72 84 16 97.9 120 1 none per protocol Treatment Response Treatment Toleration: Well Treatment Completion Status: Treatment Completed without Adverse Event HBO Attestation I certify that I supervised this HBO treatment in accordance with  Medicare guidelines. A trained emergency response team is readily available per Yes hospital policies and procedures. Continue HBOT as ordered. Yes Electronic Signature(s) Signed: 04/16/2022 3:53:51 PM By: Worthy Keeler PA-C Previous Signature: 04/16/2022 11:11:24 AM Version By: [name] RCP, RRT CHT , , Entered By: Worthy Keeler on 04/16/2022 15:53:50 [name] ([id number]) [id number] Page 2 of 2 -------------------------------------------------------------------------------- HBO Safety Checklist Details Patient Name: Date of Service: [name] LD F. 04/16/2022 8:45 A M Medical Record Number: [id number] Patient Account Number: [id number] Date of Birth/Sex: Treating RN: 11/27/1960 (61 y.o. [name], [name] Primary Care Josslin Sanjuan: [name] Other Clinician: [name] Referring Leilanni Halvorson: Treating Joclyn Alsobrook/Extender: [name] in Treatment: 11 HBO Safety Checklist Items Safety Checklist Consent Form Signed Patient voided / foley secured and emptied When did you last eato 07:00 am Last dose of injectable or oral agent 04/15/22 pm Ostomy pouch emptied and vented if applicable NA All implantable devices assessed, documented and approved NA Intravenous access site secured and place NA Valuables secured Linens and cotton and cotton/polyester blend (less than 51% polyester) Personal oil-based products / skin lotions / body lotions removed Wigs or hairpieces removed NA Smoking or tobacco materials removed NA Books / newspapers / magazines / loose paper removed NA Cologne, aftershave, perfume and deodorant removed Jewelry removed (may wrap wedding band) Make-up removed NA Hair care products removed Battery operated devices (external) removed NA Heating patches and chemical warmers removed NA Titanium eyewear removed NA Nail polish cured greater than 10 hours NA Casting material cured greater  than 10 hours NA Hearing aids removed NA Loose dentures or partials removed NA Prosthetics have been removed NA Patient demonstrates correct use of air break device (if applicable) Patient concerns have been addressed Patient grounding bracelet on and cord attached  to chamber Specifics for Inpatients (complete in addition to above) Medication sheet sent with patient Intravenous medications needed or due during therapy sent with patient Drainage tubes (e.g. nasogastric tube or chest tube secured and vented) Endotracheal or Tracheotomy tube secured Cuff deflated of air and inflated with saline Airway suctioned Electronic Signature(s) Signed: 04/16/2022 11:11:24 AM By: [name] RCP, RRT CHT , , Entered By: [name] on 04/16/2022 09:04:04 ,

## 2022-04-16 NOTE — Progress Notes (Signed)
ISAC, LINCKS ([id number]) [id number] Page 1 of 2 Visit Report for 04/16/2022 Chief Complaint Document Details Patient Name: Date of Service: [name] RO LD F. 04/16/2022 8:00 A M Medical Record Number: [id number] Patient Account Number: [id number] Date of Birth/Sex: Treating RN: [birth year] (61 y.o. [name] Primary Care Provider: [name] Other Clinician: Massie Kluver Referring Provider: Treating Provider/Extender: [name] in Treatment: 11 Information Obtained from: Patient Chief Complaint Left lateral foot ulcer Electronic Signature(s) Signed: 04/16/2022 8:25:02 AM By: Worthy Keeler PA-C Entered By: Worthy Keeler on 04/16/2022 08:25:02 -------------------------------------------------------------------------------- Problem List Details Patient Name: Date of Service: [name] RO LD F. 04/16/2022 8:00 A M Medical Record Number: [id number] Patient Account Number: [id number] Date of Birth/Sex: Treating RN: January 16, 1961 (61 y.o. 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

## 2022-04-17 ENCOUNTER — Encounter (HOSPITAL_BASED_OUTPATIENT_CLINIC_OR_DEPARTMENT_OTHER): Payer: 59 | Admitting: Internal Medicine

## 2022-04-17 DIAGNOSIS — L97524 Non-pressure chronic ulcer of other part of left foot with necrosis of bone: Secondary | ICD-10-CM | POA: Diagnosis not present

## 2022-04-17 DIAGNOSIS — E11621 Type 2 diabetes mellitus with foot ulcer: Secondary | ICD-10-CM

## 2022-04-17 DIAGNOSIS — M86372 Chronic multifocal osteomyelitis, left ankle and foot: Secondary | ICD-10-CM

## 2022-04-17 LAB — GLUCOSE, CAPILLARY
Glucose-Capillary: 115 mg/dL — ABNORMAL HIGH (ref 70–99)
Glucose-Capillary: 123 mg/dL — ABNORMAL HIGH (ref 70–99)
Glucose-Capillary: 156 mg/dL — ABNORMAL HIGH (ref 70–99)
Glucose-Capillary: 206 mg/dL — ABNORMAL HIGH (ref 70–99)

## 2022-04-17 NOTE — Progress Notes (Signed)
[name] ([id number]) [id number] Page 1 of 2 Visit Report for 04/17/2022 Arrival Information Details Patient Name: Date of Service: [name] RO LD F. 04/17/2022 8:00 A M Medical Record Number: [id number] Patient Account Number: [id number] Date of Birth/Sex: Treating RN: 1961-05-26 (61 y.o. [name], [name] Primary Care [facility]: [name] Other Clinician: [name] Referring Payson Crumby: Treating Jettie Mannor/Extender: [name] in Treatment: 11 Visit Information History Since Last Visit Added or deleted any medications: No Patient Arrived: Knee Scooter Any new allergies or adverse reactions: No Arrival Time: 08:10 Had a fall or experienced change in No Accompanied By: self activities of daily living that may affect Transfer Assistance: None risk of falls: Patient Identification Verified: Yes Signs or symptoms of abuse/neglect since last No Secondary Verification Process Completed: Yes visito Patient Requires Transmission-Based Precautions: No Hospitalized since last visit: No Patient Has Alerts: No Implantable device outside of the clinic No excluding cellular tissue based products placed in the center since last visit: Has Dressing in Place as Prescribed: Yes Has Footwear/Offloading in Place as Yes Prescribed: Left: Removable Cast Walker/Walking Boot Pain Present Now: No Electronic Signature(s) Signed: 04/17/2022 11:47:26 AM By: [name] RCP, RRT CHT , , Entered By: [name] on 04/17/2022 08:33:13 , -------------------------------------------------------------------------------- Encounter Discharge Information Details Patient Name: Date of Service: [name] RO LD F. 04/17/2022 8:00 A M Medical Record Number: [id number] Patient Account Number: [id number] Date of Birth/Sex: Treating RN: [birth year] (61 y.o. [name], [name] Primary Care Otha Monical: [name] Other  Clinician: [name] Referring Makaiyah Schweiger: Treating Arriana Lohmann/Extender: [name] in Treatment: 11 Encounter Discharge Information Items Discharge Condition: Stable Ambulatory Status: Knee Scooter Discharge Destination: Home [name] ([id number]) [id number] Page 2 of 2 Transportation: Private Auto Accompanied By: self Schedule Follow-up Appointment: Yes Clinical Summary of Care: Notes Patient has an HBO treatment scheduled on 04/22/22 at 08:45 am. Electronic Signature(s) Signed: 04/17/2022 11:47:26 AM By: [name] RCP, RRT CHT , , Entered By: [name] on 04/17/2022 11:47:03 , -------------------------------------------------------------------------------- Vitals Details Patient Name: Date of Service: [name] RO LD F. 04/17/2022 8:00 A M Medical Record Number: [id number] Patient Account Number: [id number] Date of Birth/Sex: Treating RN: Nov 21, 1960 (61 y.o. [name], [name] Primary Care Essam Lowdermilk: [name] Other Clinician: [name] Referring [name]: Treating Skyley Grandmaison/Extender: [name] in Treatment: 11 Vital Signs Time Taken: 08:20 Temperature (F): 98.0 Height (in): 70 Pulse (bpm): 72 Weight (lbs): 255 Respiratory Rate (breaths/min): 16 Body Mass Index (BMI): 36.6 Blood Pressure (mmHg): 124/74 Capillary Blood Glucose (mg/dl): 123 Reference Range: 80 - 120 mg / dl Electronic Signature(s) Signed: 04/17/2022 11:47:26 AM By: [name] RCP, RRT CHT , , Entered By: [name] on 04/17/2022 08:33:55 ,

## 2022-04-17 NOTE — Progress Notes (Signed)
[place], [place] ([id number]) [id number] Page 1 of 9 Visit Report for 04/16/2022 Arrival Information Details Patient Name: Date of Service: [name] RO LD F. 04/16/2022 8:00 A M Medical Record Number: [id number] Patient Account Number: [id number] Date of Birth/Sex: Treating RN: January 27, 1961 (60 y.o. [name]) Carlene Coria Primary Care Nayquan Evinger: [name] Other Clinician: Massie Kluver Referring Damia Bobrowski: Treating Adriana Lina/Extender: [name] in Treatment: 11 Visit Information History Since Last Visit All ordered tests and consults were completed: No Patient Arrived: Knee Scooter Added or deleted any medications: No Arrival Time: 08:11 Any new allergies or adverse reactions: No Accompanied By: self Had a fall or experienced change in No Transfer Assistance: None activities of daily living that may affect Patient Identification Verified: Yes risk of falls: Secondary Verification Process Completed: Yes Signs or symptoms of abuse/neglect since last visito No Patient Requires Transmission-Based Precautions: No Hospitalized since last visit: No Patient Has Alerts: No Implantable device outside of the clinic excluding No cellular tissue based products placed in the center since last visit: Has Dressing in Place as Prescribed: Yes Has Compression in Place as Prescribed: Yes Pain Present Now: No Electronic Signature(s) Signed: 04/17/2022 8:53:50 AM By: Carlene Coria RN Entered By: Carlene Coria on 04/16/2022 08:15:31 -------------------------------------------------------------------------------- Clinic Level of Care Assessment Details Patient Name: Date of Service: [name] RO LD F. 04/16/2022 8:00 A M Medical Record Number: [id number] Patient Account Number: [id number] Date of Birth/Sex: Treating RN: 05/23/1961 (60 y.o. [name] Primary Care Anjannette Gauger: [name] Other Clinician: Massie Kluver Referring  Kimberlyann Hollar: Treating Illa Enlow/Extender: [name] in Treatment: 11 Clinic Level of Care Assessment Items TOOL 4 Quantity Score X- 1 0 Use when only an EandM is performed on FOLLOW-UP visit ASSESSMENTS - Nursing Assessment / Reassessment X- 1 10 Reassessment of Co-morbidities (includes updates in patient status) [name], [name] ([id number]) [id number] Page 2 of 9 X- 1 5 Reassessment of Adherence to Treatment Plan ASSESSMENTS - Wound and Skin A ssessment / Reassessment X - Simple Wound Assessment / Reassessment - one wound 1 5 _0  - 0 Complex Wound Assessment / Reassessment - multiple wounds _1  - 0 Dermatologic / Skin Assessment (not related to wound area) ASSESSMENTS - Focused Assessment _2  - 0 Circumferential Edema Measurements - multi extremities _3  - 0 Nutritional Assessment / Counseling / Intervention _4  - 0 Lower Extremity Assessment (monofilament, tuning fork, pulses) _5  - 0 Peripheral Arterial Disease Assessment (using hand held doppler) ASSESSMENTS - Ostomy and/or Continence Assessment and Care _6  - 0 Incontinence Assessment and Management _7  - 0 Ostomy Care Assessment and Management (repouching, etc.) PROCESS - Coordination of Care X - Simple Patient / Family Education for ongoing care 1 15 _8  - 0 Complex (extensive) Patient / Family Education for ongoing care _9  - 0 Staff obtains Programmer, systems, Records, T Results / Process Orders est _10  - 0 Staff telephones HHA, Nursing Homes / Clarify orders / etc _11  - 0 Routine Transfer to another Facility (non-emergent condition) _12  - 0 Routine Hospital Admission (non-emergent condition) _13  - 0 New Admissions / Biomedical engineer / Ordering NPWT Apligraf, etc. , _14  - 0 Emergency Hospital Admission (emergent condition) X- 1 10 Simple Discharge Coordination _15  - 0 Complex (extensive) Discharge Coordination PROCESS - Special Needs _16  - 0 Pediatric / Minor Patient  Management _17  - 0 Isolation Patient Management _18  - 0 Hearing / Language / Visual special needs _19  - 0 Assessment of Community assistance (transportation, D/C planning, etc.) _20  - 0 Additional  assistance / Altered mentation _0  - 0 Support Surface(s) Assessment (bed, cushion, seat, etc.) INTERVENTIONS - Wound Cleansing / Measurement X - Simple Wound Cleansing - one wound 1 5 _1  - 0 Complex Wound Cleansing - multiple wounds X- 1 5 Wound Imaging (photographs - any number of wounds) _2  - 0 Wound Tracing (instead of photographs) X- 1 5 Simple Wound Measurement - one wound _3  - 0 Complex Wound Measurement - multiple wounds INTERVENTIONS - Wound Dressings X - Small Wound Dressing one or multiple wounds 1 10 _4  - 0 Medium Wound Dressing one or multiple wounds _5  - 0 Large Wound Dressing one or multiple wounds <WLNLGXQJJHERDEYC>_1<\/KGYJEHUDJSHFWYOV>_7  - 0 Application of Medications - topical <CHYIFOYDXAJOINOM>_7<\/EHMCNOBSJGGEZMOQ>_9  - 0 Application of Medications - injection INTERVENTIONS - Miscellaneous [name], [name] ([id number]) [id number] Page 3 of 9 _8  - 0 External ear exam _9  - 0 Specimen Collection (cultures, biopsies, blood, body fluids, etc.) _10  - 0 Specimen(s) / Culture(s) sent or taken to Lab for analysis _11  - 0 Patient Transfer (multiple staff / Harrel Lemon Lift / Similar devices) _12  - 0 Simple Staple / Suture removal (25 or less) _13  - 0 Complex Staple / Suture removal (26 or more) _14  - 0 Hypo / Hyperglycemic Management (close monitor of Blood Glucose) _15  - 0 Ankle / Brachial Index (ABI) - do not check if billed separately X- 1 5 Vital Signs Has the patient been seen at the hospital within the last three years: Yes Total Score: 75 Level Of Care: New/Established - Level 2 Electronic Signature(s) Signed: 04/17/2022 8:53:50 AM By: Carlene Coria RN Entered By: Carlene Coria on 04/16/2022 08:29:10 -------------------------------------------------------------------------------- Encounter Discharge Information  Details Patient Name: Date of Service: [name] RO LD F. 04/16/2022 8:00 A M Medical Record Number: [id number] Patient Account Number: [id number] Date of Birth/Sex: Treating RN: Mar 16, 1961 (60 y.o. [name]) Carlene Coria Primary Care Chayse Zatarain: [name] Other Clinician: Massie Kluver Referring Shenna Brissette: Treating Chanel Mckesson/Extender: [name] in Treatment: 11 Encounter Discharge Information Items Discharge Condition: Stable Ambulatory Status: Knee Scooter Discharge Destination: Home Transportation: Private Auto Accompanied By: self Schedule Follow-up Appointment: Yes Clinical Summary of Care: Electronic Signature(s) Signed: 04/17/2022 8:53:50 AM By: Carlene Coria RN Entered By: Carlene Coria on 04/16/2022 08:30:09 [name] ([id number]) [id number] Page 4 of 9 -------------------------------------------------------------------------------- Lower Extremity Assessment Details Patient Name: Date of Service: [name] RO LD F. 04/16/2022 8:00 A M Medical Record Number: [id number] Patient Account Number: [id number] Date of Birth/Sex: Treating RN: [birth year] (60 y.o. [name]) Carlene Coria Primary Care Jalyn Rosero: [name] Other Clinician: Massie Kluver Referring Amalya Salmons: Treating Cristle [name]/Extender: [name] in Treatment: 11 Vascular Assessment Pulses: Dorsalis Pedis Palpable: [Left:Yes] Electronic Signature(s) Signed: 04/17/2022 8:53:50 AM By: Carlene Coria RN Entered By: Carlene Coria on 04/16/2022 08:21:50 -------------------------------------------------------------------------------- Multi Wound Chart Details Patient Name: Date of Service: [name] RO LD F. 04/16/2022 8:00 A M Medical Record Number: [id number] Patient Account Number: [id number] Date of Birth/Sex: Treating RN: 1960/11/03 (60 y.o. [name] Primary Care Alisha Bacus: [name] Other Clinician: Massie Kluver Referring  Braxen Dobek: Treating Sundance Moise/Extender: [name] in Treatment: 11 Vital Signs Height(in): 70 Pulse(bpm): 101 Weight(lbs): 255 Blood Pressure(mmHg): 176/78 Body Mass Index(BMI): 36.6 Temperature(F): 97.8 Respiratory Rate(breaths/min): 18 [14:Photos:] [N/A:N/A] Left, Lateral Foot N/A N/A Wound Location: Blister N/A N/A Wounding Event: Diabetic Wound/Ulcer of the Lower N/A N/A Primary Etiology: Extremity Dehisced Wound N/A N/A Secondary Etiology: Hypertension, Type II Diabetes, N/A N/A Comorbid History: Neuropathy 10/29/2021 N/A N/A Date  Acquired: 11 N/A N/A Weeks of Treatment: Open N/A N/A Wound Status: No N/A N/A Wound Recurrence: Yes N/A N/A Pending A mputation on Presentation: 4.6x2.6x0.7 N/A N/A Measurements L x W x D (cm) 9.393 N/A N/A A (cm) : [name], [name] ([id number]) [id number] Page 5 of 9 6.575 N/A N/A Volume (cm) : 13.00% N/A N/A % Reduction in A rea: 75.60% N/A N/A % Reduction in Volume: Grade 3 N/A N/A Classification: Medium N/A N/A Exudate A mount: Serosanguineous N/A N/A Exudate Type: red, brown N/A N/A Exudate Color: Epibole N/A N/A Wound Margin: Large (67-100%) N/A N/A Granulation A mount: Red N/A N/A Granulation Quality: Small (1-33%) N/A N/A Necrotic A mount: Fat Layer (Subcutaneous Tissue): Yes N/A N/A Exposed Structures: Muscle: Yes Bone: Yes Fascia: No Tendon: No Joint: No Small (1-33%) N/A N/A Epithelialization: Treatment Notes Electronic Signature(s) Signed: 04/17/2022 8:53:50 AM By: Carlene Coria RN Entered By: Carlene Coria on 04/16/2022 08:23:43 -------------------------------------------------------------------------------- Multi-Disciplinary Care Plan Details Patient Name: Date of Service: [name] RO LD F. 04/16/2022 8:00 A M Medical Record Number: [id number] Patient Account Number: [id number] Date of Birth/Sex: Treating RN: 10-21-1960 (60 y.o. [name]) Carlene Coria Primary Care Jolee Critcher: [name] Other Clinician: Massie Kluver Referring Dava Rensch: Treating Declynn Lopresti/Extender: [name] in Treatment: 11 Active Inactive HBO Nursing Diagnoses: Anxiety related to feelings of confinement associated with the hyperbaric oxygen chamber Anxiety related to knowledge deficit of hyperbaric oxygen therapy and treatment procedures Discomfort related to temperature and humidity changes inside hyperbaric chamber Potential for barotraumas to ears, sinuses, teeth, and lungs or cerebral gas embolism related to changes in atmospheric pressure inside hyperbaric oxygen chamber Potential for oxygen toxicity seizures related to delivery of 100% oxygen at an increased atmospheric pressure Potential for pulmonary oxygen toxicity related to delivery of 100% oxygen at an increased atmospheric pressure Goals: Barotrauma will be prevented during HBO2 Date Initiated: 02/11/2022 T arget Resolution Date: 02/11/2022 Goal Status: Active Patient and/or family will be able to state/discuss factors appropriate to the management of their disease process during treatment Date Initiated: 02/11/2022 T arget Resolution Date: 02/11/2022 Goal Status: Active Patient will tolerate the hyperbaric oxygen therapy treatment Date Initiated: 02/11/2022 T arget Resolution Date: 02/11/2022 Goal Status: Active Patient will tolerate the internal climate of the chamber Date Initiated: 02/11/2022 T arget Resolution Date: 02/12/2022 Goal Status: Active [name], [name] ([id number]) [id number] Page 6 of 9 Patient/caregiver will verbalize understanding of HBO goals, rationale, procedures and potential hazards Date Initiated: 02/11/2022 Target Resolution Date: 02/11/2022 Goal Status: Active Signs and symptoms of pulmonary oxygen toxicity will be recognized and promptly addressed Date Initiated: 02/11/2022 Target Resolution Date: 02/11/2022 Goal  Status: Active Signs and symptoms of seizure will be recognized and promptly addressed ; seizing patients will suffer no harm Date Initiated: 02/11/2022 Target Resolution Date: 02/11/2022 Goal Status: Active Interventions: Administer a five (5) minute air break for patient if signs and symptoms of seizure appear and notify the hyperbaric physician Administer decongestants, per physician orders, prior to HBO2 Administer the correct therapeutic gas delivery based on the patients needs and limitations, per physician order Assess and provide for patients comfort related to the hyperbaric environment and equalization of middle ear Assess for signs and symptoms related to adverse events, including but not limited to confinement anxiety, pneumothorax, oxygen toxicity and baurotrauma Assess patient for any history of confinement anxiety Assess patient's knowledge and expectations regarding hyperbaric medicine and provide education related to the hyperbaric environment, goals of treatment and prevention of adverse  events Implement protocols to decrease risk of pneumothorax in high risk patients Notes: Wound/Skin Impairment Nursing Diagnoses: Knowledge deficit related to ulceration/compromised skin integrity Goals: Patient/caregiver will verbalize understanding of skin care regimen Date Initiated: 01/29/2022 Target Resolution Date: 03/01/2022 Goal Status: Active Ulcer/skin breakdown will have a volume reduction of 30% by week 4 Date Initiated: 01/29/2022 Date Inactivated: 04/16/2022 Target Resolution Date: 03/01/2022 Goal Status: Unmet Unmet Reason: comorbidties Ulcer/skin breakdown will have a volume reduction of 50% by week 8 Date Initiated: 01/29/2022 Date Inactivated: 04/16/2022 Target Resolution Date: 03/31/2022 Goal Status: Unmet Unmet Reason: comorbidties Ulcer/skin breakdown will have a volume reduction of 80% by week 12 Date Initiated: 01/29/2022 Target Resolution Date: 05/01/2022 Goal Status:  Active Ulcer/skin breakdown will heal within 14 weeks Date Initiated: 01/29/2022 Target Resolution Date: 05/01/2022 Goal Status: Active Interventions: Assess patient/caregiver ability to obtain necessary supplies Assess patient/caregiver ability to perform ulcer/skin care regimen upon admission and as needed Assess ulceration(s) every visit Notes: Electronic Signature(s) Signed: 04/17/2022 8:53:50 AM By: Carlene Coria RN Entered By: Carlene Coria on 04/16/2022 08:23:29 -------------------------------------------------------------------------------- Pain Assessment Details Patient Name: Date of Service: [name] RO LD F. 04/16/2022 8:00 A M Medical Record Number: [id number] Patient Account Number: [id number] Date of Birth/Sex: Treating RN: [birth year] (60 y.o. [name] Primary Care Hooper Petteway: [name] Other Clinician: Roxy, Filler ([id number]) [phone].pdf Page 7 of 9 Referring Seanpatrick Maisano: Treating Pruitt Taboada/Extender: [name] in Treatment: 11 Active Problems Location of Pain Severity and Description of Pain Patient Has Paino No Site Locations Pain Management and Medication Current Pain Management: Electronic Signature(s) Signed: 04/17/2022 8:53:50 AM By: Carlene Coria RN Entered By: Carlene Coria on 04/16/2022 08:19:24 -------------------------------------------------------------------------------- Patient/Caregiver Education Details Patient Name: Date of Service: [name] RO LD F. 10/17/2023andnbsp8:00 [place] Record Number: [id number] Patient Account Number: [id number] Date of Birth/Gender: Treating RN: 04-27-1961 (60 y.o. [name] Primary Care Physician: [name] Other Clinician: Massie Kluver Referring Physician: Treating Physician/Extender: [name] in Treatment: 11 Education Assessment Education Provided To: Patient Education Topics  Provided Wound/Skin Impairment: Methods: Explain/Verbal Responses: State content correctly Electronic Signature(s) Signed: 04/17/2022 8:53:50 AM By: Carlene Coria RN Entered By: Carlene Coria on 04/16/2022 41:96:22 WLNLG, XQJJHE R ([id number]) [id number] Page 8 of 9 -------------------------------------------------------------------------------- Wound Assessment Details Patient Name: Date of Service: [name] RO LD F. 04/16/2022 8:00 A M Medical Record Number: [id number] Patient Account Number: [id number] Date of Birth/Sex: Treating RN: 03/01/1961 (60 y.o. [name]) Carlene Coria Primary Care Agata Lucente: [name] Other Clinician: Massie Kluver Referring Dylann Layne: Treating Winda Summerall/Extender: [name] in Treatment: 11 Wound Status Wound Number: 14 Primary Etiology: Diabetic Wound/Ulcer of the Lower Extremity Wound Location: Left, Lateral Foot Secondary Etiology: Dehisced Wound Wounding Event: Blister Wound Status: Open Date Acquired: 10/29/2021 Comorbid History: Hypertension, Type II Diabetes, Neuropathy Weeks Of Treatment: 11 Clustered Wound: No Pending Amputation On Presentation Photos Wound Measurements Length: (cm) 4.6 Width: (cm) 2.6 Depth: (cm) 0.7 Area: (cm) 9.3 Volume: (cm) 6.5 % Reduction in Area: 13% % Reduction in Volume: 75.6% Epithelialization: Small (1-33%) 93 Tunneling: No 75 Undermining: No Wound Description Classification: Grade 3 Wound Margin: Epibole Exudate Amount: Medium Exudate Type: Serosanguineous Exudate Color: red, brown Foul Odor After Cleansing: No Slough/Fibrino Yes Wound Bed Granulation Amount: Large (67-100%) Exposed Structure Granulation Quality: Red Fascia Exposed: No Necrotic Amount: Small (1-33%) Fat Layer (Subcutaneous Tissue) Exposed: Yes Necrotic Quality: Adherent Slough Tendon Exposed: No Muscle Exposed: Yes Necrosis of Muscle: No  Joint Exposed: No Bone Exposed:  Yes Treatment Notes Wound #14 (Foot) Wound Laterality: Left, Lateral [name], [name] ([id number]) [id number] Page 9 of 9 Cleanser [place] Ancillary Kit - 15 Day Supply Discharge Instruction: Use supplies as instructed; Kit contains: (15) Saline Bullets; (15) 3x3 Gauze; 15 pr Gloves Wound Cleanser Discharge Instruction: Wash your hands with soap and water. Remove old dressing, discard into plastic bag and place into trash. Cleanse the wound with Wound Cleanser prior to applying a clean dressing using gauze sponges, not tissues or cotton balls. Do not scrub or use excessive force. Pat dry using gauze sponges, not tissue or cotton balls. Peri-Wound Care Topical Primary Dressing Hydrofera Blue Ready Transfer Foam, 4x5 (in/in) Discharge Instruction: Apply Hydrofera Blue Ready to wound bed as directed Secondary Dressing ABD Pad 5x9 (in/in) Discharge Instruction: Cover with ABD pad Secured With [place] H Soft Cloth Surgical T ape ape, 2x2 (in/yd) Kerlix Roll Sterile or Non-Sterile 6-ply 4.5x4 (yd/yd) Discharge Instruction: Apply Kerlix as directed Compression Wrap Compression Stockings Add-Ons Electronic Signature(s) Signed: 04/17/2022 8:53:50 AM By: Carlene Coria RN Entered By: Carlene Coria on 04/16/2022 08:21:11 -------------------------------------------------------------------------------- Vitals Details Patient Name: Date of Service: [name], [name] RO LD F. 04/16/2022 8:00 A M Medical Record Number: [id number] Patient Account Number: [id number] Date of Birth/Sex: Treating RN: 11-09-1960 (60 y.o. [name]) Carlene Coria Primary Care Caleah Tortorelli: [name] Other Clinician: Massie Kluver Referring Jove Beyl: Treating Ketsia Linebaugh/Extender: [name] in Treatment: 11 Vital Signs Time Taken: 08:15 Temperature (F): 97.8 Height (in): 70 Pulse (bpm): 101 Weight (lbs): 255 Respiratory Rate (breaths/min): 18 Body Mass Index  (BMI): 36.6 Blood Pressure (mmHg): 176/78 Reference Range: 80 - 120 mg / dl Electronic Signature(s) Signed: 04/17/2022 8:53:50 AM By: Carlene Coria RN Entered By: Carlene Coria on 04/16/2022 08:15:51

## 2022-04-17 NOTE — Progress Notes (Signed)
LAYTH, CEREZO ([id number]) [id number] Page 1 of 2 Visit Report for 04/17/2022 HBO Details Patient Name: Date of Service: [name] RO LD F. 04/17/2022 8:00 A M Medical Record Number: [id number] Patient Account Number: [id number] Date of Birth/Sex: Treating RN: 06/06/1961 (61 y.o. 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 HBO Treatment Details Treatment Number: 40 Patient Type: Outpatient Chamber Type: Monoplace Chamber Serial #: [id number] Treatment Protocol: 2.0 ATA with 90 minutes oxygen, and no air breaks Treatment Details Compression Rate Down: 1.5 psi / minute De-Compression Rate Up: 1.5 psi / minute Air breaks and breathing Decompress Decompress Compress Tx Pressure Begins Reached periods Begins Ends (leave unused spaces blank) Chamber Pressure (ATA 1 2 ------2 1 ) Clock Time (24 hr) 08:46 08:55 - - - - - - 10:26 10:36 Treatment Length: 110 (minutes) Treatment Segments: 4 Vital Signs Capillary Blood Glucose Reference Range: 80 - 120 mg / dl HBO Diabetic Blood Glucose Intervention Range: <131 mg/dl or >249 mg/dl Type: Time Vitals Blood Pulse: Respiratory Temperature: Capillary Blood Glucose Pulse Action Taken: Pressure: Rate: Glucose (mg/dl): Meter #: Oximetry (%) Taken: Pre 08:20 124/74 72 16 98 123 1 Gave Ensure per protocol; recheck 20 min per DO Pre 08:41 156 1 Recheck; Proceed with HBO Post 10:47 116/68 72 16 98 206 1 none per protocol Treatment Response Treatment Toleration: Well Treatment Completion Status: Treatment Completed without Adverse Event HBO  Attestation I certify that I supervised this HBO treatment in accordance with Medicare guidelines. A trained emergency response team is readily available per Yes hospital policies and procedures. Continue HBOT as ordered. Yes Electronic Signature(s) Signed: 04/17/2022 11:58:41 AM By: Kalman Shan DO Previous Signature: 04/17/2022 11:47:26 AM Version By: [name] RCP, RRT CHT , , Previous Signature: 04/17/2022 9:48:45 AM Version By: Florentina Addison ([id number]) [id number] Page 2 of 2 Previous Signature: 04/17/2022 9:48:45 AM Version By: Kalman Shan DO Entered By: Kalman Shan on 04/17/2022 11:58:12 -------------------------------------------------------------------------------- HBO Safety Checklist Details Patient Name: Date of Service: [name] RO LD F. 04/17/2022 8:00 A M Medical Record Number: [id number] Patient Account Number: [id number] Date of Birth/Sex: Treating RN: [birth year] (61 y.o. [name], [name] Primary Care Alexzandrea Normington: [name] Other Clinician: [name] Referring Bisma Klett: Treating Correen Bubolz/Extender: [name] in Treatment: 11 HBO Safety Checklist Items Safety Checklist Consent Form Signed Patient voided / foley secured and emptied When did you last eato 07:00 am Last dose of injectable or oral agent 04/16/22 pm Ostomy pouch emptied and vented if applicable NA All implantable devices assessed, documented and approved NA Intravenous access site secured and place NA Valuables secured Linens and cotton and cotton/polyester blend (less than 51% polyester) Personal oil-based products / skin lotions / body lotions removed Wigs or hairpieces removed NA Smoking or tobacco materials removed NA Books / newspapers / magazines / loose paper removed NA Cologne, aftershave, perfume and deodorant removed Jewelry removed (may wrap wedding band) Make-up  removed NA Hair care products removed Battery operated devices (external) removed NA Heating patches and chemical warmers removed NA Titanium eyewear removed NA Nail polish cured greater than 10 hours NA Casting material cured greater than 10 hours NA Hearing aids removed NA Loose  dentures or partials removed NA Prosthetics have been removed NA Patient demonstrates correct use of air break device (if applicable) Patient concerns have been addressed Patient grounding bracelet on and cord attached to chamber Specifics for Inpatients (complete in addition to above) Medication sheet sent with patient Intravenous medications needed or due during therapy sent with patient Drainage tubes (e.g. nasogastric tube or chest tube secured and vented) Endotracheal or Tracheotomy tube secured Cuff deflated of air and inflated with saline Airway suctioned Electronic Signature(s) Signed: 04/17/2022 11:47:26 AM By: [name] RCP, RRT CHT , , Entered By: [name] on 04/17/2022 08:35:09 ,

## 2022-04-22 ENCOUNTER — Encounter: Payer: 59 | Admitting: Physician Assistant

## 2022-04-23 ENCOUNTER — Encounter: Payer: 59 | Admitting: Physician Assistant

## 2022-04-23 DIAGNOSIS — E11621 Type 2 diabetes mellitus with foot ulcer: Secondary | ICD-10-CM | POA: Diagnosis not present

## 2022-04-23 NOTE — Progress Notes (Signed)
[name], [name] ([id number]) [id number] Page 1 of 4 Visit Report for 04/23/2022 Chief Complaint Document Details Patient Name: Date of Service: [name] RO LD F. 04/23/2022 8:00 A M Medical Record Number: [id number] Patient Account Number: [id number] Date of Birth/Sex: Treating RN: Nov 12, 1960 (61 y.o. [name], [name] Primary Care Provider: [name] Other Clinician: Massie Kluver Referring Provider: Treating Provider/Extender: [name] in Treatment: 12 Information Obtained from: Patient Chief Complaint Left lateral foot ulcer Electronic Signature(s) Signed: 04/23/2022 8:20:44 AM By: Worthy Keeler PA-C Entered By: Worthy Keeler on 04/23/2022 08:20:44 -------------------------------------------------------------------------------- Debridement Details Patient Name: Date of Service: [name] RO LD F. 04/23/2022 8:00 A M Medical Record Number: [id number] Patient Account Number: [id number] Date of Birth/Sex: Treating RN: 17-Dec-1960 (61 y.o. [name]) Carlene Coria Primary Care Provider: [name] Other Clinician: Massie Kluver Referring Provider: Treating Provider/Extender: [name] in Treatment: 12 Debridement Performed for Assessment: Wound #14 Left,Lateral Foot Performed By: Physician Tommie Sams., PA-C Debridement Type: Debridement Severity of Tissue Pre Debridement: Fat layer exposed Level of Consciousness (Pre-procedure): Awake and Alert Pre-procedure Verification/Time Out Yes - 08:24 Taken: Start Time: 08:24 T Area Debrided (L x W): otal 5.5 (cm) x 3 (cm) = 16.5 (cm) Tissue and other material debrided: Viable, Non-Viable, Callus, Slough, Subcutaneous, Skin: Dermis , Skin: Epidermis, Slough Level: Skin/Subcutaneous Tissue Debridement Description: Excisional Instrument: Curette Bleeding: Moderate Hemostasis Achieved: Pressure End Time: 08:30 Procedural Pain: 4 Post Procedural  Pain: 4 Response to Treatment: Procedure was tolerated well KINGSLY, KLOEPFER ([id number]) [id number] Page 2 of 4 Level of Consciousness (Post- Awake and Alert procedure): Post Debridement Measurements of Total Wound Length: (cm) 4.7 Width: (cm) 2.5 Depth: (cm) 0.3 Volume: (cm) 2.769 Character of Wound/Ulcer Post Debridement: Improved Severity of Tissue Post Debridement: Fat layer exposed Post Procedure Diagnosis Same as Pre-procedure Electronic Signature(s) Unsigned Entered ByCarlene Coria on 04/23/2022 08:27:14 -------------------------------------------------------------------------------- Physician Orders Details Patient Name: Date of Service: [name] RO LD F. 04/23/2022 8:00 A M Medical Record Number: [id number] Patient Account Number: [id number] Date of Birth/Sex: Treating RN: [birth year] (61 y.o. [name] Primary Care Provider: [name] Other Clinician: Massie Kluver Referring Provider: Treating Provider/Extender: [name] in Treatment: 12 Verbal / Phone Orders: No Diagnosis Coding Follow-up Appointments Return Appointment in 1 week. Bathing/ Shower/ Hygiene May shower with wound dressing protected with water repellent cover or cast protector. Edema Control - Lymphedema / Segmental Compressive Device / Other Elevate, Exercise Daily and A void Standing for Long Periods of Time. Elevate legs to the level of the heart and pump ankles as often as possible Elevate leg(s) parallel to the floor when sitting. Off-Loading Removable cast walker boot - Left Other: - knee scooter Additional Orders / Instructions Other: - need daily dressing changes Hyperbaric Oxygen Therapy Wound #14 Left,Lateral Foot Indication and location: - Left foot osteomyelitis If appropriate for treatment, begin HBOT per protocol: 2.0 ATA for 90 Minutes without A Breaks ir One treatment per day (delivered Monday through Friday  unless otherwise specified in Special Instructions below): Total # of Treatments: - 40 additional 40 requested for a total of 80 A ntihistamine 30 minutes prior to HBO Treatment, difficulty clearing ears. Finger stick Blood Glucose Pre- and Post- HBOT Treatment. Follow Hyperbaric Oxygen Glycemia Protocol Wound Treatment SIDDIQ, KALUZNY ([id number]) [id number] Page 3 of 4 Wound #14 - Foot Wound Laterality: Left, Lateral Cleanser: [employer]ram Ancillary Kit - 15 Day Supply (Generic) 3 x  Per Week/30 Days Discharge Instructions: Use supplies as instructed; Kit contains: (15) Saline Bullets; (15) 3x3 Gauze; 15 pr Gloves Cleanser: Wound Cleanser 3 x Per Week/30 Days Discharge Instructions: Wash your hands with soap and water. Remove old dressing, discard into plastic bag and place into trash. Cleanse the wound with Wound Cleanser prior to applying a clean dressing using gauze sponges, not tissues or cotton balls. Do not scrub or use excessive force. Pat dry using gauze sponges, not tissue or cotton balls. Prim Dressing: Hydrofera Blue Ready Transfer Foam, 4x5 (in/in) (Dispense As Written) 3 x Per Week/30 Days ary Discharge Instructions: Apply Hydrofera Blue Ready to wound bed as directed Secondary Dressing: ABD Pad 5x9 (in/in) (Generic) 3 x Per Week/30 Days Discharge Instructions: Cover with ABD pad Secured With: Medipore T - 62M Medipore H Soft Cloth Surgical T ape ape, 2x2 (in/yd) (Generic) 3 x Per Week/30 Days Secured With: [employer] Sterile or Non-Sterile 6-ply 4.5x4 (yd/yd) (Generic) 3 x Per Week/30 Days Discharge Instructions: Apply Kerlix as directed Reeds pre-HBO capillary blood glucose (ensure 1 physician order is in chart). A. Notify HBO physician and await physician orders. 2 If result is 70 mg/dl or below: B. If the result meets the hospital definition of a critical result,  follow hospital policy. A. Give patient an 8 ounce Glucerna Shake, an 8 ounce Ensure, or 8 ounces of a Glucerna/Ensure equivalent dietary supplement*. B. Wait 30 minutes. If result is 71 mg/dl to 130 mg/dl: C. Retest patients capillary blood glucose (CBG). D. If result greater than or equal to 110 mg/dl, proceed with HBO. If result less than 110 mg/dl, notify HBO physician and consider holding HBO. If result is 131 mg/dl to 249 mg/dl: A. Proceed with HBO. A. Notify HBO physician and await physician orders. B. It is recommended to hold HBO and do If result is 250 mg/dl or greater: blood/urine ketone testing. C. If the result meets the hospital definition of a critical result, follow hospital policy. POST-HBO GLYCEMIA INTERVENTIONS ACTION INTERVENTION Obtain post HBO capillary blood glucose (ensure 1 physician order is in chart). A. Notify HBO physician and await physician orders. 2 If result is 70 mg/dl or below: B. If the result meets the hospital definition of a critical result, follow hospital policy. A. Give patient an 8 ounce Glucerna Shake, an 8 ounce Ensure, or 8 ounces of a Glucerna/Ensure equivalent dietary supplement*. B. Wait 15 minutes for symptoms of If result is 71 mg/dl to 100 mg/dl: hypoglycemia (i.e. nervousness, anxiety, sweating, chills, clamminess, irritability, confusion, tachycardia or dizziness). C. If patient asymptomatic, discharge patient. If patient symptomatic, repeat capillary blood glucose (CBG) and notify HBO physician. If result is 101 mg/dl to 249 mg/dl: A. Discharge patient. A. Notify HBO physician and await physician orders. B. It is recommended to do blood/urine ketone If result is 250 mg/dl or greater: testing. C. If the result meets the hospital definition of a critical result, follow hospital policy. *Juice or candies are NOT equivalent products. If patient refuses the Glucerna or Ensure, please consult the  hospital dietitian for an appropriate substitute. Electronic Signature(s) Unsigned Entered By: Carlene Coria on 04/23/2022 08:25:54 Signature(s): TRAYTON, SZABO ([id number]) [id number] Date(s): [id number] Page 4 of 4 -------------------------------------------------------------------------------- Problem List Details Patient Name: Date of Service: [name] RO LD F. 04/23/2022 8:00 A M Medical Record Number: [id number] Patient Account Number: [id number] Date of Birth/Sex: Treating RN: 21-Feb-1961 (61 y.o. [name] Primary Care Provider:  [name] Other Clinician: Massie Kluver Referring Provider: Treating Provider/Extender: [name] in Treatment: 12 Active Problems ICD-10 Encounter Code Description Active Date MDM Diagnosis [phone] Chronic multifocal osteomyelitis, left ankle and foot 01/29/2022 No Yes E11.621 Type 2 diabetes mellitus with foot ulcer 01/29/2022 No Yes L97.524 Non-pressure chronic ulcer of other part of left foot with necrosis of bone 01/29/2022 No Yes T81.31XA Disruption of external operation (surgical) wound, not elsewhere classified, 01/29/2022 No Yes initial encounter Inactive Problems Resolved Problems Electronic Signature(s) Signed: 04/23/2022 8:20:39 AM By: Worthy Keeler PA-C Entered By: Worthy Keeler on 04/23/2022 08:20:38

## 2022-04-24 ENCOUNTER — Encounter: Payer: 59 | Admitting: Internal Medicine

## 2022-04-24 NOTE — Progress Notes (Signed)
[name] ([id number]) [id number] Page 1 of 9 Visit Report for 04/23/2022 Arrival Information Details Patient Name: Date of Service: [name] RO LD F. 04/23/2022 8:00 A M Medical Record Number: [id number] Patient Account Number: [id number] Date of Birth/Sex: Treating RN: 05-04-1961 (61 y.o. [name]) [name] Primary Care [name]: [name] Other Clinician: Massie [name] Referring [name]: Treating [name]/Extender: [name] in Treatment: 12 Visit Information History Since Last Visit All ordered tests and consults were completed: No Patient Arrived: Knee Scooter Added or deleted any medications: No Arrival Time: 08:09 Any new allergies or adverse reactions: No Accompanied By: self Had a fall or experienced change in No Transfer Assistance: None activities of daily living that may affect Patient Identification Verified: Yes risk of falls: Secondary Verification Process Completed: Yes Signs or symptoms of abuse/neglect since last visito No Patient Requires Transmission-Based Precautions: No Hospitalized since last visit: No Patient Has Alerts: No Implantable device outside of the clinic excluding No cellular tissue based products placed in the center since last visit: Has Dressing in Place as Prescribed: Yes Pain Present Now: Yes Electronic Signature(s) Signed: 04/24/2022 9:46:19 AM By: [name] Coria RN Entered By: [name] on 04/23/2022 08:13:11 -------------------------------------------------------------------------------- Clinic Level of Care Assessment Details Patient Name: Date of Service: [name] RO LD F. 04/23/2022 8:00 A M Medical Record Number: [id number] Patient Account Number: [id number] Date of Birth/Sex: Treating RN: September 10, 1960 (61 y.o. [name]) [name] Primary Care [name]: [name] Other Clinician: Massie [name] Referring [name]: Treating [name]/Extender: [name] in Treatment: 12 Clinic Level of Care Assessment Items TOOL 1 Quantity Score []  - 0 Use when EandM and Procedure is performed on INITIAL visit ASSESSMENTS - Nursing Assessment / Reassessment []  - 0 General Physical Exam (combine w/ comprehensive assessment (listed just below) when performed on new pt. evals) []  - 0 Comprehensive Assessment (HX, ROS, Risk Assessments, Wounds Hx, etc.) [name], [name] ([id number]) [id number] Page 2 of 9 ASSESSMENTS - Wound and Skin Assessment / Reassessment []  - 0 Dermatologic / Skin Assessment (not related to wound area) ASSESSMENTS - Ostomy and/or Continence Assessment and Care []  - 0 Incontinence Assessment and Management []  - 0 Ostomy Care Assessment and Management (repouching, etc.) PROCESS - Coordination of Care []  - 0 Simple Patient / Family Education for ongoing care []  - 0 Complex (extensive) Patient / Family Education for ongoing care []  - 0 Staff obtains Programmer, systems, Records, T Results / Process Orders est []  - 0 Staff telephones HHA, Nursing Homes / Clarify orders / etc []  - 0 Routine Transfer to another Facility (non-emergent condition) []  - 0 Routine Hospital Admission (non-emergent condition) []  - 0 New Admissions / Biomedical engineer / Ordering NPWT Apligraf, etc. , []  - 0 Emergency Hospital Admission (emergent condition) PROCESS - Special Needs []  - 0 Pediatric / Minor Patient Management []  - 0 Isolation Patient Management []  - 0 Hearing / Language / Visual special needs []  - 0 Assessment of Community assistance (transportation, D/C planning, etc.) []  - 0 Additional assistance / Altered mentation []  - 0 Support Surface(s) Assessment (bed, cushion, seat, etc.) INTERVENTIONS - Miscellaneous []  - 0 External ear exam []  - 0 Patient Transfer (multiple staff / Civil Service fast streamer / Similar devices) []  - 0 Simple Staple / Suture removal (25 or less) []  - 0 Complex Staple / Suture  removal (26 or more) []  - 0 Hypo/Hyperglycemic Management (do not check if billed separately) []  - 0 Ankle / Brachial Index (ABI) -  do not check if billed separately Has the patient been seen at the hospital within the last three years: Yes Total Score: 0 Level Of Care: ____ Electronic Signature(s) Signed: 04/24/2022 9:46:19 AM By: [name] Coria RN Entered By: [name] on 04/23/2022 08:28:59 -------------------------------------------------------------------------------- Encounter Discharge Information Details Patient Name: Date of Service: [name] RO LD F. 04/23/2022 8:00 A M Medical Record Number: [id number] Patient Account Number: [id number] Date of Birth/Sex: Treating RN: [birth year] (61 y.o. [name] Primary Care [name]: [name] Other Clinician: Massie [name] Referring [name]: Treating [name]/Extender: [name] ([id number]) [id number] Page 3 of 9 Weeks in Treatment: 12 Encounter Discharge Information Items Post Procedure Vitals Discharge Condition: Stable Temperature (F): 97.8 Ambulatory Status: Knee Scooter Pulse (bpm): 99 Discharge Destination: Home Respiratory Rate (breaths/min): 18 Transportation: Private Auto Blood Pressure (mmHg): 162/85 Accompanied By: self Schedule Follow-up Appointment: Yes Clinical Summary of Care: Electronic Signature(s) Signed: 04/24/2022 9:46:19 AM By: [name] Coria RN Entered By: [name] on 04/23/2022 08:30:14 -------------------------------------------------------------------------------- Lower Extremity Assessment Details Patient Name: Date of Service: [name] RO LD F. 04/23/2022 8:00 A M Medical Record Number: [id number] Patient Account Number: [id number] Date of Birth/Sex: Treating RN: [birth year] (61 y.o. [name]) [name] Primary Care [name]: [name] Other Clinician: Massie [name] Referring [name]: Treating [name]/Extender:  [name] in Treatment: 12 Vascular Assessment Pulses: Dorsalis Pedis Palpable: [Left:Yes] Electronic Signature(s) Signed: 04/24/2022 9:46:19 AM By: [name] Coria RN Entered By: [name] on 04/23/2022 08:19:18 -------------------------------------------------------------------------------- Multi Wound Chart Details Patient Name: Date of Service: [name] RO LD F. 04/23/2022 8:00 A M Medical Record Number: [id number] Patient Account Number: [id number] Date of Birth/Sex: Treating RN: [birth year] (61 y.o. [name] Primary Care Damein Gaunce: [name] Other Clinician: Massie [name] Referring Loralyn Rachel: Treating Baraa Tubbs/Extender: [name] in Treatment: 12 Vital Signs Height(in): 70 Pulse(bpm): [address] F ([id number]) [id number] Page 4 of 9 Weight(lbs): 255 Blood Pressure(mmHg): 162/85 Body Mass Index(BMI): 36.6 Temperature(F): 97.8 Respiratory Rate(breaths/min): 18 [14:Photos:] [N/A:N/A] Left, Lateral Foot N/A N/A Wound Location: Blister N/A N/A Wounding Event: Diabetic Wound/Ulcer of the Lower N/A N/A Primary Etiology: Extremity Dehisced Wound N/A N/A Secondary Etiology: Hypertension, Type II Diabetes, N/A N/A Comorbid History: Neuropathy 10/29/2021 N/A N/A Date Acquired: 12 N/A N/A Weeks of Treatment: Open N/A N/A Wound Status: No N/A N/A Wound Recurrence: Yes N/A N/A Pending A mputation on Presentation: 4.7x2.5x0.3 N/A N/A Measurements L x W x D (cm) 9.228 N/A N/A A (cm) : rea 2.769 N/A N/A Volume (cm) : 14.50% N/A N/A % Reduction in A rea: 89.70% N/A N/A % Reduction in Volume: Grade 3 N/A N/A Classification: Medium N/A N/A Exudate A mount: Serosanguineous N/A N/A Exudate Type: red, brown N/A N/A Exudate Color: Epibole N/A N/A Wound Margin: Large (67-100%) N/A N/A Granulation A mount: Red N/A N/A Granulation Quality: Small (1-33%) N/A  N/A Necrotic A mount: Fat Layer (Subcutaneous Tissue): Yes N/A N/A Exposed Structures: Muscle: Yes Bone: Yes Fascia: No Tendon: No Joint: No Small (1-33%) N/A N/A Epithelialization: Treatment Notes Electronic Signature(s) Signed: 04/24/2022 9:46:19 AM By: [name] Coria RN Entered By: [name] on 04/23/2022 08:19:30 -------------------------------------------------------------------------------- Multi-Disciplinary Care Plan Details Patient Name: Date of Service: [name] RO LD F. 04/23/2022 8:00 A M Medical Record Number: [id number] Patient Account Number: [id number] Date of Birth/Sex: Treating RN: 09-19-1960 (61 y.o. [name] Primary Care Dekayla Prestridge: [name] Other Clinician: Massie [name] Referring Malene Blaydes: Treating Mandy Peeks/Extender: Hansel Feinstein,  [name] in Treatment: [address] F ([id number]) [id number] Page 5 of 9 Active Inactive HBO Nursing Diagnoses: Anxiety related to feelings of confinement associated with the hyperbaric oxygen chamber Anxiety related to knowledge deficit of hyperbaric oxygen therapy and treatment procedures Discomfort related to temperature and humidity changes inside hyperbaric chamber Potential for barotraumas to ears, sinuses, teeth, and lungs or cerebral gas embolism related to changes in atmospheric pressure inside hyperbaric oxygen chamber Potential for oxygen toxicity seizures related to delivery of 100% oxygen at an increased atmospheric pressure Potential for pulmonary oxygen toxicity related to delivery of 100% oxygen at an increased atmospheric pressure Goals: Barotrauma will be prevented during HBO2 Date Initiated: 02/11/2022 T arget Resolution Date: 02/11/2022 Goal Status: Active Patient and/or family will be able to state/discuss factors appropriate to the management of their disease process during treatment Date Initiated: 02/11/2022 T arget Resolution Date: 02/11/2022 Goal  Status: Active Patient will tolerate the hyperbaric oxygen therapy treatment Date Initiated: 02/11/2022 T arget Resolution Date: 02/11/2022 Goal Status: Active Patient will tolerate the internal climate of the chamber Date Initiated: 02/11/2022 T arget Resolution Date: 02/12/2022 Goal Status: Active Patient/caregiver will verbalize understanding of HBO goals, rationale, procedures and potential hazards Date Initiated: 02/11/2022 T arget Resolution Date: 02/11/2022 Goal Status: Active Signs and symptoms of pulmonary oxygen toxicity will be recognized and promptly addressed Date Initiated: 02/11/2022 T arget Resolution Date: 02/11/2022 Goal Status: Active Signs and symptoms of seizure will be recognized and promptly addressed ; seizing patients will suffer no harm Date Initiated: 02/11/2022 T arget Resolution Date: 02/11/2022 Goal Status: Active Interventions: Administer a five (5) minute air break for patient if signs and symptoms of seizure appear and notify the hyperbaric physician Administer decongestants, per physician orders, prior to HBO2 Administer the correct therapeutic gas delivery based on the patients needs and limitations, per physician order Assess and provide for patients comfort related to the hyperbaric environment and equalization of middle ear Assess for signs and symptoms related to adverse events, including but not limited to confinement anxiety, pneumothorax, oxygen toxicity and baurotrauma Assess patient for any history of confinement anxiety Assess patient's knowledge and expectations regarding hyperbaric medicine and provide education related to the hyperbaric environment, goals of treatment and prevention of adverse events Implement protocols to decrease risk of pneumothorax in high risk patients Notes: Wound/Skin Impairment Nursing Diagnoses: Knowledge deficit related to ulceration/compromised skin integrity Goals: Patient/caregiver will verbalize understanding of  skin care regimen Date Initiated: 01/29/2022 Target Resolution Date: 03/01/2022 Goal Status: Active Ulcer/skin breakdown will have a volume reduction of 30% by week 4 Date Initiated: 01/29/2022 Date Inactivated: 04/16/2022 Target Resolution Date: 03/01/2022 Goal Status: Unmet Unmet Reason: comorbidties Ulcer/skin breakdown will have a volume reduction of 50% by week 8 Date Initiated: 01/29/2022 Date Inactivated: 04/16/2022 Target Resolution Date: 03/31/2022 Goal Status: Unmet Unmet Reason: comorbidties Ulcer/skin breakdown will have a volume reduction of 80% by week 12 Date Initiated: 01/29/2022 Target Resolution Date: 05/01/2022 Goal Status: Active Ulcer/skin breakdown will heal within 14 weeks Date Initiated: 01/29/2022 Target Resolution Date: 05/01/2022 Goal Status: Active Interventions: [name], [name] ([id number]) [id number] Page 6 of 9 Assess patient/caregiver ability to obtain necessary supplies Assess patient/caregiver ability to perform ulcer/skin care regimen upon admission and as needed Assess ulceration(s) every visit Notes: Electronic Signature(s) Signed: 04/24/2022 9:46:19 AM By: [name] Coria RN Entered By: [name] on 04/23/2022 08:19:23 -------------------------------------------------------------------------------- Pain Assessment Details Patient Name: Date of Service: [name] RO LD F. 04/23/2022 8:00 A M Medical Record Number: [id number]  Patient Account Number: [id number] Date of Birth/Sex: Treating RN: July 06, 1960 (61 y.o. [name]) [name] Primary Care Regginald Pask: [name] Other Clinician: Massie [name] Referring Coryn Mosso: Treating Waneta Fitting/Extender: [name] in Treatment: 12 Active Problems Location of Pain Severity and Description of Pain Patient Has Paino Yes Site Locations With Dressing Change: Yes Duration of the Pain. Constant / Intermittento Intermittent How Long Does it Lasto Hours: Minutes:  30 Rate the pain. Current Pain Level: 4 Worst Pain Level: 6 Least Pain Level: 0 Tolerable Pain Level: 5 Character of Pain Describe the Pain: Burning Pain Management and Medication Current Pain Management: Medication: Yes Cold Application: No Rest: Yes Massage: No Activity: No T.E.N.S.: No Heat Application: No Leg drop or elevation: No Is the Current Pain Management Adequate: Inadequate How does your wound impact your activities of daily livingo Sleep: Yes Bathing: No Appetite: No Relationship With Others: No Bladder Continence: No Emotions: No Bowel Continence: No Work: No Toileting: No Drive: No Dressing: No HobbiesMAYNOR, [name] ([id number]) [id number] Page 7 of 9 Electronic Signature(s) Signed: 04/24/2022 9:46:19 AM By: [name] Coria RN Entered By: [name] on 04/23/2022 08:27:46 -------------------------------------------------------------------------------- Patient/Caregiver Education Details Patient Name: Date of Service: [name] RO LD F. 10/24/2023andnbsp8:00 A M Medical Record Number: [id number] Patient Account Number: [id number] Date of Birth/Gender: Treating RN: February 11, 1961 (61 y.o. [name] Primary Care Physician: [name] Other Clinician: Massie [name] Referring Physician: Treating Physician/Extender: [name] in Treatment: 12 Education Assessment Education Provided To: Patient Education Topics Provided Wound/Skin Impairment: Methods: Explain/Verbal Responses: State content correctly [employer]) Signed: 04/24/2022 9:46:19 AM By: [name] Coria RN Entered By: [name] on 04/23/2022 08:29:16 -------------------------------------------------------------------------------- Wound Assessment Details Patient Name: Date of Service: [name] RO LD F. 04/23/2022 8:00 A M Medical Record Number: [id number] Patient Account Number: [id number] Date of Birth/Sex:  Treating RN: Oct 01, 1960 (61 y.o. [name] Primary Care Samaiya Awadallah: [name] Other Clinician: Massie [name] Referring Ranson Belluomini: Treating Nthony Lefferts/Extender: [name] in Treatment: 12 Wound Status Wound Number: 14 Primary Etiology: Diabetic Wound/Ulcer of the Lower Extremity Wound Location: Left, Lateral Foot Secondary Etiology: Dehisced Wound Wounding Event: Blister Wound Status: Open Date Acquired: 10/29/2021 Comorbid History: Hypertension, Type II Diabetes, Neuropathy Weeks Of Treatment: 12 Clustered Wound: No [name], [name] ([id number]) [id number] Page 8 of 9 Pending Amputation On Presentation Photos Wound Measurements Length: (cm) 4.7 Width: (cm) 2.5 Depth: (cm) 0.3 Area: (cm) 9.228 Volume: (cm) 2.769 % Reduction in Area: 14.5% % Reduction in Volume: 89.7% Epithelialization: Small (1-33%) Tunneling: No Undermining: No Wound Description Classification: Grade 3 Wound Margin: Epibole Exudate Amount: Medium Exudate Type: Serosanguineous Exudate Color: red, brown Foul Odor After Cleansing: No Slough/Fibrino Yes Wound Bed Granulation Amount: Large (67-100%) Exposed Structure Granulation Quality: Red Fascia Exposed: No Necrotic Amount: Small (1-33%) Fat Layer (Subcutaneous Tissue) Exposed: Yes Necrotic Quality: Adherent Slough Tendon Exposed: No Muscle Exposed: Yes Necrosis of Muscle: No Joint Exposed: No Bone Exposed: Yes Treatment Notes Wound #14 (Foot) Wound Laterality: Left, Lateral Cleanser [place] Ancillary Kit - 15 Day Supply Discharge Instruction: Use supplies as instructed; Kit contains: (15) Saline Bullets; (15) 3x3 Gauze; 15 pr Gloves Wound Cleanser Discharge Instruction: Wash your hands with soap and water. Remove old dressing, discard into plastic bag and place into trash. Cleanse the wound with Wound Cleanser prior to applying a clean dressing using gauze sponges, not tissues or cotton  balls. Do not scrub or use excessive force. Pat dry using gauze  sponges, not tissue or cotton balls. Peri-Wound Care Topical Primary Dressing Hydrofera Blue Ready Transfer Foam, 4x5 (in/in) Discharge Instruction: Apply Hydrofera Blue Ready to wound bed as directed Secondary Dressing ABD Pad 5x9 (in/in) Discharge Instruction: Cover with ABD pad Secured With Medipore T - 42M Medipore H Soft Cloth Surgical T ape ape, 2x2 (in/yd) Kerlix Roll Sterile or Non-Sterile 6-ply 4.5x4 (yd/yd) Discharge Instruction: Apply Kerlix as directed Compression Wrap Compression Stockings Add-Ons [name], [name] ([id number]) [id number] Page 9 of 9 Electronic Signature(s) Signed: 04/24/2022 9:46:19 AM By: [name] Coria RN Entered By: [name] on 04/23/2022 08:18:58 -------------------------------------------------------------------------------- Vitals Details Patient Name: Date of Service: [name], [name] RO LD F. 04/23/2022 8:00 A M Medical Record Number: [id number] Patient Account Number: [id number] Date of Birth/Sex: Treating RN: 03/26/1961 (61 y.o. [name]) [name] Primary Care Jumar Greenstreet: [name] Other Clinician: Massie [name] Referring Devlyn Retter: Treating Deaken Jurgens/Extender: [name] in Treatment: 12 Vital Signs Time Taken: 08:13 Temperature (F): 97.8 Height (in): 70 Pulse (bpm): 99 Weight (lbs): 255 Respiratory Rate (breaths/min): 18 Body Mass Index (BMI): 36.6 Blood Pressure (mmHg): 162/85 Reference Range: 80 - 120 mg / dl Electronic Signature(s) Signed: 04/24/2022 9:46:19 AM By: [name] Coria RN Entered By: [name] on 04/23/2022 08:13:39

## 2022-04-25 ENCOUNTER — Encounter: Payer: 59 | Admitting: Physician Assistant

## 2022-04-26 ENCOUNTER — Encounter: Payer: 59 | Admitting: Physician Assistant

## 2022-04-29 ENCOUNTER — Encounter: Payer: 59 | Admitting: Physician Assistant

## 2022-04-29 DIAGNOSIS — E11621 Type 2 diabetes mellitus with foot ulcer: Secondary | ICD-10-CM | POA: Diagnosis not present

## 2022-04-29 LAB — GLUCOSE, CAPILLARY
Glucose-Capillary: 161 mg/dL — ABNORMAL HIGH (ref 70–99)
Glucose-Capillary: 155 mg/dL — ABNORMAL HIGH (ref 70–99)

## 2022-04-29 NOTE — Progress Notes (Signed)
TYDUS, SANMIGUEL ([id number]) [id number] Page 1 of 2 Visit Report for 04/29/2022 Arrival Information Details Patient Name: Date of Service: [name] RO LD F. 04/29/2022 8:00 A M Medical Record Number: [id number] Patient Account Number: [id number] Date of Birth/Sex: Treating RN: [birth year] (61 y.o. [name], [name] Primary Care Chondra Boyde: [name] Other Clinician: [name] Referring Shraddha Lebron: Treating Kanton Kamel/Extender: [name] in Treatment: 12 Visit Information History Since Last Visit Added or deleted any medications: No Patient Arrived: Knee Scooter Any new allergies or adverse reactions: No Arrival Time: 08:10 Had a fall or experienced change in No Accompanied By: self activities of daily living that may affect Transfer Assistance: None risk of falls: Patient Identification Verified: Yes Signs or symptoms of abuse/neglect since No Secondary Verification Process Completed: Yes last visito Patient Requires Transmission-Based Precautions: No Hospitalized since last visit: No Patient Has Alerts: No Implantable device outside of the clinic No excluding cellular tissue based products placed in the center since last visit: Has Dressing in Place as Prescribed: Yes Has Footwear/Offloading in Place as Yes Prescribed: Left: Surgical Shoe with Pressure Relief Insole Pain Present Now: No Electronic Signature(s) Signed: 04/29/2022 11:40:17 AM By: [name] RCP, RRT CHT , , Entered By: [name] on 04/29/2022 09:15:02 , -------------------------------------------------------------------------------- Encounter Discharge Information Details Patient Name: Date of Service: [name] RO LD F. 04/29/2022 8:00 A M Medical Record Number: [id number] Patient Account Number: [id number] Date of Birth/Sex: Treating RN: 1961-03-27 (61 y.o. [name], [name] Primary Care Kellyann Ordway: [name] Other  Clinician: [name] Referring Eran Mistry: Treating Ahnesti Townsend/Extender: [name] in Treatment: 12 Encounter Discharge Information Items Discharge Condition: Stable Ambulatory Status: Knee Scooter Discharge Destination: Home [name] ([id number]) [id number] Page 2 of 2 Transportation: Private Auto Accompanied By: self Schedule Follow-up Appointment: Yes Clinical Summary of Care: Notes Patient has an HBO treatment scheduled on 04/30/22 at 08:00 am. Electronic Signature(s) Signed: 04/29/2022 11:40:17 AM By: [name] RCP, RRT CHT , , Entered By: [name] on 04/29/2022 11:36:55 , -------------------------------------------------------------------------------- Vitals Details Patient Name: Date of Service: [name] RO LD F. 04/29/2022 8:00 A M Medical Record Number: [id number] Patient Account Number: [id number] Date of Birth/Sex: Treating RN: July 05, 1960 (61 y.o. [name], [name] Primary Care Ayslin Kundert: [name] Other Clinician: [name] Referring Roniesha Hollingshead: Treating Demetric Parslow/Extender: [name] in Treatment: 12 Vital Signs Time Taken: 08:23 Temperature (F): 98.0 Height (in): 70 Pulse (bpm): 72 Weight (lbs): 255 Respiratory Rate (breaths/min): 16 Body Mass Index (BMI): 36.6 Blood Pressure (mmHg): 122/66 Capillary Blood Glucose (mg/dl): 161 Reference Range: 80 - 120 mg / dl Electronic Signature(s) Signed: 04/29/2022 11:40:17 AM By: [name] RCP, RRT CHT , , Entered By: [name] on 04/29/2022 09:16:03 ,

## 2022-04-29 NOTE — Progress Notes (Signed)
[name] ([id number]) [id number] Page 1 of 2 Visit Report for 04/29/2022 HBO Details Patient Name: Date of Service: [name] RO LD F. 04/29/2022 8:00 A M Medical Record Number: [id number] Patient Account Number: [id number] Date of Birth/Sex: Treating RN: 07-Apr-1961 (61 y.o. 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 HBO Treatment Details Treatment Number: 41 Patient Type: Outpatient Chamber Type: Monoplace Chamber Serial #: [id number] Treatment Protocol: 2.0 ATA with 90 minutes oxygen, and no air breaks Treatment Details Compression Rate Down: 1.5 psi / minute De-Compression Rate Up: 1.5 psi / minute Air breaks and breathing Decompress Decompress Compress Tx Pressure Begins Reached periods Begins Ends (leave unused spaces blank) Chamber Pressure (ATA 1 2 ------2 1 ) Clock Time (24 hr) 08:29 08:40 - - - - - - 10:10 10:20 Treatment Length: 111 (minutes) Treatment Segments: 4 Vital Signs Capillary Blood Glucose Reference Range: 80 - 120 mg / dl HBO Diabetic Blood Glucose Intervention Range: <131 mg/dl or >249 mg/dl Type: Time Vitals Blood Respiratory Capillary Blood Glucose Pulse Action Pulse: Temperature: Taken: Pressure: Rate: Glucose (mg/dl): Meter #: Oximetry (%) Taken: Pre 08:23 122/66 72 16 98 161 1 none per protocol Post 10:25 118/76 72 16 97.8 155 1 none per protocol Treatment Response Treatment Toleration: Well Treatment Completion Status: Treatment Completed without Adverse Event Electronic Signature(s) Signed: 04/29/2022 11:40:17 AM By: [name],  RRT CHT , , Signed: 04/29/2022 4:57:47 PM By: Worthy Keeler [name] Entered By: [name] on 04/29/2022 11:35:49 , [name] ([id number]) [id number] Page 2 of 2 -------------------------------------------------------------------------------- HBO Safety Checklist Details Patient Name: Date of Service: [name] RO LD F. 04/29/2022 8:00 A M Medical Record Number: [id number] Patient Account Number: [id number] Date of Birth/Sex: Treating RN: April 03, 1961 (61 y.o. [name], [name] Primary Care [name]: [name] [name]: [name] Referring [name]: Treating [name]/Extender: [name] in Treatment: 12 HBO Safety Checklist Items Safety Checklist Consent Form Signed Patient voided / foley secured and emptied When did you last eato 07:00 am Last dose of injectable or oral agent 04/28/22 pm Ostomy pouch emptied and vented if applicable NA All implantable devices assessed, documented and approved NA Intravenous access site secured and place NA Valuables secured Linens and cotton and cotton/polyester blend (less than 51% polyester) Personal oil-based products / skin lotions / body lotions removed Wigs or hairpieces removed NA Smoking or tobacco materials removed NA Books / newspapers / magazines / loose paper removed NA Cologne, aftershave, perfume and deodorant removed Jewelry removed (may wrap wedding band) Make-up removed NA Hair care products removed Battery operated devices (external) removed NA Heating patches and chemical warmers removed NA Titanium eyewear removed NA Nail polish cured greater than 10 hours NA Casting material cured greater than 10 hours NA Hearing aids removed NA Loose dentures or partials removed NA Prosthetics have been removed NA Patient demonstrates correct use of air break device (if applicable) Patient concerns have been addressed Patient grounding  bracelet on and cord attached to chamber Specifics for Inpatients (complete in addition to above) Medication sheet sent with patient Intravenous medications needed or due during therapy sent with patient Drainage tubes (e.g. nasogastric tube or chest tube secured and  vented) Endotracheal or Tracheotomy tube secured Cuff deflated of air and inflated with saline Airway suctioned Electronic Signature(s) Signed: 04/29/2022 11:40:17 AM By: [name], RRT CHT , , Entered By: [name] on 04/29/2022 09:17:56 ,

## 2022-04-30 ENCOUNTER — Encounter: Payer: 59 | Admitting: Physician Assistant

## 2022-04-30 DIAGNOSIS — E11621 Type 2 diabetes mellitus with foot ulcer: Secondary | ICD-10-CM | POA: Diagnosis not present

## 2022-04-30 LAB — GLUCOSE, CAPILLARY
Glucose-Capillary: 172 mg/dL — ABNORMAL HIGH (ref 70–99)
Glucose-Capillary: 115 mg/dL — ABNORMAL HIGH (ref 70–99)

## 2022-04-30 NOTE — Progress Notes (Signed)
[name], [name] ([id number]) [id number] Page 1 of 2 Visit Report for 04/30/2022 Arrival Information Details Patient Name: Date of Service: [name] RO LD F. 04/30/2022 8:45 A M Medical Record Number: [id number] Patient Account Number: [id number] Date of Birth/Sex: Treating RN: 1961-05-27 (61 y.o. [name], [name] Primary Care Dail Lerew: [name] Other Clinician: Jacqulyn [name] Referring [name]: Treating [name]/Extender: [name] in Treatment: 64 Visit Information History Since Last Visit Added or deleted any medications: No Patient Arrived: Knee Scooter Any new allergies or adverse reactions: No Arrival Time: 08:45 Had a fall or experienced change in No Accompanied By: self activities of daily living that may affect Transfer Assistance: None risk of falls: Patient Identification Verified: Yes Signs or symptoms of abuse/neglect since No Secondary Verification Process Completed: Yes last visito Patient Requires Transmission-Based Precautions: No Hospitalized since last visit: No Patient Has Alerts: No Implantable device outside of the clinic No excluding cellular tissue based products placed in the center since last visit: Has Dressing in Place as Prescribed: Yes Has Footwear/Offloading in Place as Yes Prescribed: Left: Surgical Shoe with Pressure Relief Insole Pain Present Now: No Electronic Signature(s) Signed: 04/30/2022 12:36:36 PM By: [name] RCP, RRT CHT , , Entered By: [name] on 04/30/2022 09:17:50 , -------------------------------------------------------------------------------- Encounter Discharge Information Details Patient Name: Date of Service: [name] RO LD F. 04/30/2022 8:45 A M Medical Record Number: [id number] Patient Account Number: [id number] Date of Birth/Sex: Treating RN: Dec 11, 1960 (61 y.o. [name], [name] Primary Care [name]: [name] Other  Clinician: Jacqulyn [name] Referring Edan Juday: Treating [name]/Extender: [name] in Treatment: 13 Encounter Discharge Information Items Discharge Condition: Stable Ambulatory Status: Knee Scooter Discharge Destination: Home [name], [name] ([id number]) [id number] Page 2 of 2 Transportation: Private Auto Accompanied By: self Schedule Follow-up Appointment: Yes Clinical Summary of Care: Notes Patient has an HBO treatment scheduled on 05/01/22 at 08:00 am. Electronic Signature(s) Signed: 04/30/2022 12:36:36 PM By: [name] RCP, RRT CHT , , Entered By: [name] on 04/30/2022 12:36:18 , -------------------------------------------------------------------------------- Vitals Details Patient Name: Date of Service: [name] RO LD F. 04/30/2022 8:45 A M Medical Record Number: [id number] Patient Account Number: [id number] Date of Birth/Sex: Treating RN: [birth year] (60 y.o. [name], [name] Primary Care [name]: [name] Other Clinician: Jacqulyn [name] Referring [name]: Treating [name]/Extender: [name] in Treatment: 13 Vital Signs Time Taken: 08:16 Temperature (F): 97.8 Height (in): 70 Pulse (bpm): 103 Weight (lbs): 255 Respiratory Rate (breaths/min): 18 Body Mass Index (BMI): 36.6 Blood Pressure (mmHg): 118/66 Capillary Blood Glucose (mg/dl): 172 Reference Range: 80 - 120 mg / dl Electronic Signature(s) Signed: 04/30/2022 12:36:36 PM By: [name] RCP, RRT CHT , , Entered By: [name] on 04/30/2022 09:19:49 ,

## 2022-04-30 NOTE — Progress Notes (Signed)
MIRANDA, GARBER ([id number]) [id number] Page 1 of 9 Visit Report for 04/30/2022 Arrival Information Details Patient Name: Date of Service: [name] RO LD F. 04/30/2022 8:00 A M Medical Record Number: [id number] Patient Account Number: [id number] Date of Birth/Sex: Treating RN: 1960-07-17 (61 y.o. [name]) Carlene Coria Primary Care [name]: [name] Other Clinician: Referring Aliviah Spain: Treating [name]/Extender: [name] in Treatment: 49 Visit Information History Since Last Visit All ordered tests and consults were completed: No Patient Arrived: Knee Scooter Added or deleted any medications: No Arrival Time: 08:03 Any new allergies or adverse reactions: No Accompanied By: self Had a fall or experienced change in No Transfer Assistance: None activities of daily living that may affect Patient Identification Verified: Yes risk of falls: Secondary Verification Process Completed: Yes Signs or symptoms of abuse/neglect since last visito No Patient Requires Transmission-Based Precautions: No Hospitalized since last visit: No Patient Has Alerts: No Implantable device outside of the clinic excluding No cellular tissue based products placed in the center since last visit: Has Dressing in Place as Prescribed: Yes Pain Present Now: No Electronic Signature(s) Signed: 04/30/2022 9:19:06 AM By: Carlene Coria RN Entered By: Carlene Coria on 04/30/2022 08:09:36 -------------------------------------------------------------------------------- Clinic Level of Care Assessment Details Patient Name: Date of Service: [name] RO LD F. 04/30/2022 8:00 [place] Record Number: [id number] Patient Account Number: [id number] Date of Birth/Sex: Treating RN: 1961/05/13 (61 y.o. [name]) Carlene Coria Primary Care Devlynn Knoff: [name] Other Clinician: Referring Shanya Ferriss: Treating Tammi Boulier/Extender: [name] in Treatment:  13 Clinic Level of Care Assessment Items TOOL 1 Quantity Score _0  - 0 Use when EandM and Procedure is performed on INITIAL visit ASSESSMENTS - Nursing Assessment / Reassessment _1  - 0 General Physical Exam (combine w/ comprehensive assessment (listed just below) when performed on new pt. evals) _2  - 0 Comprehensive Assessment (HX, ROS, Risk Assessments, Wounds Hx, etc.) [name], [name] ([id number]) [id number] Page 2 of 9 ASSESSMENTS - Wound and Skin Assessment / Reassessment _3  - 0 Dermatologic / Skin Assessment (not related to wound area) ASSESSMENTS - Ostomy and/or Continence Assessment and Care _4  - 0 Incontinence Assessment and Management _5  - 0 Ostomy Care Assessment and Management (repouching, etc.) PROCESS - Coordination of Care _6  - 0 Simple Patient / Family Education for ongoing care _7  - 0 Complex (extensive) Patient / Family Education for ongoing care _8  - 0 Staff obtains Programmer, systems, Records, T Results / Process Orders est _9  - 0 Staff telephones HHA, Nursing Homes / Clarify orders / etc _10  - 0 Routine Transfer to another Facility (non-emergent condition) _11  - 0 Routine Hospital Admission (non-emergent condition) _12  - 0 New Admissions / Biomedical engineer / Ordering NPWT Apligraf, etc. , _13  - 0 Emergency Hospital Admission (emergent condition) PROCESS - Special Needs _14  - 0 Pediatric / Minor Patient Management _15  - 0 Isolation Patient Management _16  - 0 Hearing / Language / Visual special needs _17  - 0 Assessment of Community assistance (transportation, D/C planning, etc.) _18  - 0 Additional assistance / Altered mentation _19  - 0 Support Surface(s) Assessment (bed, cushion, seat, etc.) INTERVENTIONS - Miscellaneous _20  - 0 External ear exam _21  - 0 Patient Transfer (multiple staff / Civil Service fast streamer / Similar devices) _22  - 0 Simple Staple / Suture removal (25 or less) _23  - 0 Complex Staple / Suture removal (26 or more) _24  -  0 Hypo/Hyperglycemic Management (do not check if billed separately) _25  - 0 Ankle / Brachial Index (ABI) - do not check  if billed separately Has the patient been seen at the hospital within the last three years: Yes Total Score: 0 Level Of Care: ____ Electronic Signature(s) Signed: 04/30/2022 9:19:06 AM By: Carlene Coria RN Entered By: Carlene Coria on 04/30/2022 08:31:12 -------------------------------------------------------------------------------- Encounter Discharge Information Details Patient Name: Date of Service: [name] RO LD F. 04/30/2022 8:00 A M Medical Record Number: [id number] Patient Account Number: [id number] Date of Birth/Sex: Treating RN: 08/25/1960 (61 y.o. [name] Primary Care Sudais Banghart: [name] Other Clinician: Referring Natalyah Cummiskey: Treating [name]/Extender: [name] ([id number]) [id number] Page 3 of 9 Weeks in Treatment: 13 Encounter Discharge Information Items Post Procedure Vitals Discharge Condition: Stable Temperature (F): 97.8 Ambulatory Status: Knee Scooter Pulse (bpm): 103 Discharge Destination: Home Respiratory Rate (breaths/min): 18 Transportation: Private Auto Blood Pressure (mmHg): 157/80 Accompanied By: self Schedule Follow-up Appointment: Yes Clinical Summary of Care: Electronic Signature(s) Signed: 04/30/2022 9:19:06 AM By: Carlene Coria RN Entered By: Carlene Coria on 04/30/2022 08:32:23 -------------------------------------------------------------------------------- Lower Extremity Assessment Details Patient Name: Date of Service: [name] RO LD F. 04/30/2022 8:00 A M Medical Record Number: [id number] Patient Account Number: [id number] Date of Birth/Sex: Treating RN: 1961-01-29 (61 y.o. [name] Primary Care Caitlyne Ingham: [name] Other Clinician: Referring Kaylen Nghiem: Treating Sebrena Engh/Extender: [name] in Treatment:  13 Vascular Assessment Pulses: Dorsalis Pedis Palpable: [Left:Yes] Electronic Signature(s) Signed: 04/30/2022 9:19:06 AM By: Carlene Coria RN Entered By: Carlene Coria on 04/30/2022 08:12:04 -------------------------------------------------------------------------------- Multi Wound Chart Details Patient Name: Date of Service: [name] RO LD F. 04/30/2022 8:00 A M Medical Record Number: [id number] Patient Account Number: [id number] Date of Birth/Sex: Treating RN: [birth year] (61 y.o. [name] Primary Care Mirta Mally: [name] Other Clinician: Referring Saiquan Hands: Treating Bhavya Eschete/Extender: [name] in Treatment: 13 Vital Signs Height(in): 70 Pulse(bpm): [address] F ([id number]) [id number] Page 4 of 9 Weight(lbs): 255 Blood Pressure(mmHg): 157/80 Body Mass Index(BMI): 36.6 Temperature(F): 97.8 Respiratory Rate(breaths/min): 18 [14:Photos:] [N/A:N/A] Left, Lateral Foot N/A N/A Wound Location: Blister N/A N/A Wounding Event: Diabetic Wound/Ulcer of the Lower N/A N/A Primary Etiology: Extremity Dehisced Wound N/A N/A Secondary Etiology: Hypertension, Type II Diabetes, N/A N/A Comorbid History: Neuropathy 10/29/2021 N/A N/A Date Acquired: 13 N/A N/A Weeks of Treatment: Open N/A N/A Wound Status: No N/A N/A Wound Recurrence: Yes N/A N/A Pending A mputation on Presentation: 4.5x2.5x0.3 N/A N/A Measurements L x W x D (cm) 8.836 N/A N/A A (cm) : rea 2.651 N/A N/A Volume (cm) : 18.20% N/A N/A % Reduction in A rea: 90.20% N/A N/A % Reduction in Volume: Grade 3 N/A N/A Classification: Medium N/A N/A Exudate A mount: Serosanguineous N/A N/A Exudate Type: red, brown N/A N/A Exudate Color: Epibole N/A N/A Wound Margin: Large (67-100%) N/A N/A Granulation A mount: Red N/A N/A Granulation Quality: Small (1-33%) N/A N/A Necrotic A mount: Fat Layer (Subcutaneous Tissue): Yes N/A  N/A Exposed Structures: Muscle: Yes Bone: Yes Fascia: No Tendon: No Joint: No Small (1-33%) N/A N/A Epithelialization: Treatment Notes Electronic Signature(s) Signed: 04/30/2022 9:19:06 AM By: Carlene Coria RN Entered By: Carlene Coria on 04/30/2022 08:12:29 -------------------------------------------------------------------------------- Multi-Disciplinary Care Plan Details Patient Name: Date of Service: [name] RO LD F. 04/30/2022 8:00 A M Medical Record Number: [id number] Patient Account Number: [id number] Date of Birth/Sex: Treating RN: Jan 11, 1961 (61 y.o. [name] Primary Care Kael Keetch: [name] Other Clinician: Referring Umaiza Matusik: Treating [name]/Extender: [name] in Treatment: [address] [place] F ([id number]) [id number] Page  5 of 9 Active Inactive HBO Nursing Diagnoses: Anxiety related to feelings of confinement associated with the hyperbaric oxygen chamber Anxiety related to knowledge deficit of hyperbaric oxygen therapy and treatment procedures Discomfort related to temperature and humidity changes inside hyperbaric chamber Potential for barotraumas to ears, sinuses, teeth, and lungs or cerebral gas embolism related to changes in atmospheric pressure inside hyperbaric oxygen chamber Potential for oxygen toxicity seizures related to delivery of 100% oxygen at an increased atmospheric pressure Potential for pulmonary oxygen toxicity related to delivery of 100% oxygen at an increased atmospheric pressure Goals: Barotrauma will be prevented during HBO2 Date Initiated: 02/11/2022 T arget Resolution Date: 02/11/2022 Goal Status: Active Patient and/or family will be able to state/discuss factors appropriate to the management of their disease process during treatment Date Initiated: 02/11/2022 T arget Resolution Date: 02/11/2022 Goal Status: Active Patient will tolerate the hyperbaric oxygen therapy  treatment Date Initiated: 02/11/2022 T arget Resolution Date: 02/11/2022 Goal Status: Active Patient will tolerate the internal climate of the chamber Date Initiated: 02/11/2022 T arget Resolution Date: 02/12/2022 Goal Status: Active Patient/caregiver will verbalize understanding of HBO goals, rationale, procedures and potential hazards Date Initiated: 02/11/2022 T arget Resolution Date: 02/11/2022 Goal Status: Active Signs and symptoms of pulmonary oxygen toxicity will be recognized and promptly addressed Date Initiated: 02/11/2022 T arget Resolution Date: 02/11/2022 Goal Status: Active Signs and symptoms of seizure will be recognized and promptly addressed ; seizing patients will suffer no harm Date Initiated: 02/11/2022 T arget Resolution Date: 02/11/2022 Goal Status: Active Interventions: Administer a five (5) minute air break for patient if signs and symptoms of seizure appear and notify the hyperbaric physician Administer decongestants, per physician orders, prior to HBO2 Administer the correct therapeutic gas delivery based on the patients needs and limitations, per physician order Assess and provide for patients comfort related to the hyperbaric environment and equalization of middle ear Assess for signs and symptoms related to adverse events, including but not limited to confinement anxiety, pneumothorax, oxygen toxicity and baurotrauma Assess patient for any history of confinement anxiety Assess patient's knowledge and expectations regarding hyperbaric medicine and provide education related to the hyperbaric environment, goals of treatment and prevention of adverse events Implement protocols to decrease risk of pneumothorax in high risk patients Notes: Wound/Skin Impairment Nursing Diagnoses: Knowledge deficit related to ulceration/compromised skin integrity Goals: Patient/caregiver will verbalize understanding of skin care regimen Date Initiated: 01/29/2022 Target Resolution Date:  03/01/2022 Goal Status: Active Ulcer/skin breakdown will have a volume reduction of 30% by week 4 Date Initiated: 01/29/2022 Date Inactivated: 04/16/2022 Target Resolution Date: 03/01/2022 Goal Status: Unmet Unmet Reason: comorbidties Ulcer/skin breakdown will have a volume reduction of 50% by week 8 Date Initiated: 01/29/2022 Date Inactivated: 04/16/2022 Target Resolution Date: 03/31/2022 Goal Status: Unmet Unmet Reason: comorbidties Ulcer/skin breakdown will have a volume reduction of 80% by week 12 Date Initiated: 01/29/2022 Target Resolution Date: 05/01/2022 Goal Status: Active Ulcer/skin breakdown will heal within 14 weeks Date Initiated: 01/29/2022 Target Resolution Date: 05/01/2022 Goal Status: Active Interventions: [name], [name] ([id number]) [id number] Page 6 of 9 Assess patient/caregiver ability to obtain necessary supplies Assess patient/caregiver ability to perform ulcer/skin care regimen upon admission and as needed Assess ulceration(s) every visit Notes: Electronic Signature(s) Signed: 04/30/2022 9:19:06 AM By: Carlene Coria RN Entered By: Carlene Coria on 04/30/2022 08:12:17 -------------------------------------------------------------------------------- Pain Assessment Details Patient Name: Date of Service: [name] RO LD F. 04/30/2022 8:00 A M Medical Record Number: [id number] Patient Account Number: [id number] Date of Birth/Sex: Treating RN: 04-28-1961 (61  y.o. [name] Primary Care Dorsie Sethi: [name] Other Clinician: Referring Mychael Soots: Treating Blayke Pinera/Extender: [name] in Treatment: 13 Active Problems Location of Pain Severity and Description of Pain Patient Has Paino No Site Locations Pain Management and Medication Current Pain Management: Electronic Signature(s) Signed: 04/30/2022 9:19:06 AM By: Carlene Coria RN Entered By: Carlene Coria on 04/30/2022 08:10:03 [name] ([id number])  [id number] Page 7 of 9 -------------------------------------------------------------------------------- Patient/Caregiver Education Details Patient Name: Date of Service: [name] RO LD F. 10/31/2023andnbsp8:00 A M Medical Record Number: [id number] Patient Account Number: [id number] Date of Birth/Gender: Treating RN: 08-29-1960 (61 y.o. [name]) Carlene Coria Primary Care Physician: [name] Other Clinician: Referring Physician: Treating Physician/Extender: [name] in Treatment: 13 Education Assessment Education Provided To: Patient Education Topics Provided Wound/Skin Impairment: Methods: Explain/Verbal Responses: State content correctly [employer]) Signed: 04/30/2022 9:19:06 AM By: Carlene Coria RN Entered By: Carlene Coria on 04/30/2022 08:31:29 -------------------------------------------------------------------------------- Wound Assessment Details Patient Name: Date of Service: [name] RO LD F. 04/30/2022 8:00 A M Medical Record Number: [id number] Patient Account Number: [id number] Date of Birth/Sex: Treating RN: December 05, 1960 (61 y.o. [name]) Carlene Coria Primary Care Kymari Nuon: [name] Other Clinician: Referring Karanvir Balderston: Treating Aeriana Speece/Extender: [name] in Treatment: 13 Wound Status Wound Number: 14 Primary Etiology: Diabetic Wound/Ulcer of the Lower Extremity Wound Location: Left, Lateral Foot Secondary Etiology: Dehisced Wound Wounding Event: Blister Wound Status: Open Date Acquired: 10/29/2021 Comorbid History: Hypertension, Type II Diabetes, Neuropathy Weeks Of Treatment: 13 Clustered Wound: No Pending Amputation On Presentation Photos [name], [name] ([id number]) [id number] Page 8 of 9 Wound Measurements Length: (cm) 4.5 Width: (cm) 2.5 Depth: (cm) 0.3 Area: (cm) 8.836 Volume: (cm) 2.651 % Reduction in Area: 18.2% % Reduction in  Volume: 90.2% Epithelialization: Small (1-33%) Tunneling: No Undermining: No Wound Description Classification: Grade 3 Wound Margin: Epibole Exudate Amount: Medium Exudate Type: Serosanguineous Exudate Color: red, brown Foul Odor After Cleansing: No Slough/Fibrino Yes Wound Bed Granulation Amount: Large (67-100%) Exposed Structure Granulation Quality: Red Fascia Exposed: No Necrotic Amount: Small (1-33%) Fat Layer (Subcutaneous Tissue) Exposed: Yes Necrotic Quality: Adherent Slough Tendon Exposed: No Muscle Exposed: Yes Necrosis of Muscle: No Joint Exposed: No Bone Exposed: Yes Treatment Notes Wound #14 (Foot) Wound Laterality: Left, Lateral Cleanser [place] Ancillary Kit - 15 Day Supply Discharge Instruction: Use supplies as instructed; Kit contains: (15) Saline Bullets; (15) 3x3 Gauze; 15 pr Gloves Wound Cleanser Discharge Instruction: Wash your hands with soap and water. Remove old dressing, discard into plastic bag and place into trash. Cleanse the wound with Wound Cleanser prior to applying a clean dressing using gauze sponges, not tissues or cotton balls. Do not scrub or use excessive force. Pat dry using gauze sponges, not tissue or cotton balls. Peri-Wound Care Topical Primary Dressing Hydrofera Blue Ready Transfer Foam, 4x5 (in/in) Discharge Instruction: Apply Hydrofera Blue Ready to wound bed as directed Secondary Dressing ABD Pad 5x9 (in/in) Discharge Instruction: Cover with ABD pad Secured With [place]apaw H Soft Cloth Surgical T ape ape, 2x2 (in/yd) Kerlix Roll Sterile or Non-Sterile 6-ply 4.5x4 (yd/yd) Discharge Instruction: Apply Kerlix as directed Compression Wrap Compression Stockings Add-Ons Electronic Signature(s) Signed: 04/30/2022 9:19:06 AM By: Carlene Coria RN Entered By: Carlene Coria on 04/30/2022 08:11:37 [name] ([id number]) [id number] Page 9 of  9 -------------------------------------------------------------------------------- Vitals Details Patient Name: Date of Service: [name] RO LD F. 04/30/2022 8:00 A M Medical Record Number: [id number] Patient Account Number: [id number] Date  of Birth/Sex: Treating RN: 12-01-1960 (61 y.o. [name]) Carlene Coria Primary Care Bates Collington: [name] Other Clinician: Referring Joclynn Lumb: Treating [name]: [name] in Treatment: 13 Vital Signs Time Taken: 08:09 Temperature (F): 97.8 Height (in): 70 Pulse (bpm): 103 Weight (lbs): 255 Respiratory Rate (breaths/min): 18 Body Mass Index (BMI): 36.6 Blood Pressure (mmHg): 157/80 Reference Range: 80 - 120 mg / dl Electronic Signature(s) Signed: 04/30/2022 9:19:06 AM By: Carlene Coria RN Entered By: Carlene Coria on 04/30/2022 08:09:56

## 2022-04-30 NOTE — Progress Notes (Signed)
ZAKK, BORGEN ([id number]) [id number] Page 1 of 2 Visit Report for [name] HBO Details Patient Name: Date of Service: [name] RO [name] F. [name] 8:45 A M Medical Record Number: [id number] Patient Account Number: [id number] Date of Birth/Sex: [name]: [birth year] (61 y.o. 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 HBO [name] Details [name] Number: [id number] Patient Type: Outpatient Chamber Type: Monoplace Chamber Serial #: [id number] [name] Protocol: 2.0 ATA with 90 minutes oxygen, and no air breaks [name] Details Compression Rate Down: 1.5 psi / minute De-Compression Rate Up: 1.5 psi / minute Air breaks and breathing Decompress Decompress Compress Tx Pressure Begins Reached periods Begins Ends (leave unused spaces blank) Chamber Pressure (ATA 1 2 ------2 1 ) Clock Time (24 hr) 08:50 09:00 - - - - - - 10:30 10:40 [name] Length: 110 (minutes) [name] Segments: 4 Vital Signs Capillary Blood Glucose Reference Range: 80 - 120 mg / dl HBO Diabetic Blood Glucose Intervention Range: <131 mg/dl or >249 mg/dl Type: Time Vitals Blood Respiratory Capillary Blood Glucose Pulse Action Pulse: Temperature: Taken: Pressure: Rate: Glucose (mg/dl): Meter #: Oximetry (%) Taken: Pre 08:16 118/66 103 18 97.8 172 1 none per protocol Post 10:50 124/68 78 16 97.9 115 1 none per protocol [name] Response [name] Toleration: Well [name] Completion Status: [name] Completed without Adverse Event Electronic Signature(s) Signed: 04/30/2022 [name] By: [name]  RCP, RRT CHT , , Signed: 04/30/2022 [name] By: [name] Keeler PA-C Entered By: [name] on [name] 12:34:40 , [name] ([id number]) [id number] Page 2 of 2 -------------------------------------------------------------------------------- HBO Safety Checklist Details Patient Name: Date of Service: [name] [name] F. [name] 8:45 A M Medical Record Number: [id number] Patient Account Number: [id number] Date of Birth/Sex: [name]: 09-24-1960 (61 y.o. [name] [name] [name]: [name] Other Clinician: Jacqulyn [name] Referring [name]: [name] [name]: [name] in [name]: 13 HBO Safety Checklist Items Safety Checklist Consent Form Signed Patient voided / foley secured and emptied When did you last eato 07:00 am Last dose of injectable or oral agent 04/29/22 [name] Ostomy pouch emptied and vented if applicable NA All implantable devices assessed, documented and approved NA Intravenous access site secured and place NA Valuables secured Linens and cotton and cotton/polyester blend (less than 51% polyester) Personal oil-based products / skin lotions / body lotions removed Wigs or hairpieces removed NA Smoking or tobacco materials removed NA Books / newspapers / magazines / loose paper removed NA Cologne, aftershave, perfume and deodorant removed Jewelry removed (may wrap wedding band) Make-up removed NA Hair [name] products removed Battery operated devices (external) removed NA Heating patches and chemical warmers removed NA Titanium eyewear removed NA Nail polish cured greater than 10 hours NA Casting material cured greater than 10 hours NA Hearing aids removed NA Loose dentures or partials removed NA Prosthetics have been removed NA Patient demonstrates correct use of air break device (if applicable) Patient concerns have been addressed Patient grounding  bracelet on and cord attached to chamber Specifics for Inpatients (complete in addition to above) Medication sheet sent with patient Intravenous medications needed or due during therapy sent with patient Drainage tubes (e.g. nasogastric tube or chest tube secured and  vented) Endotracheal or Tracheotomy tube secured Cuff deflated of air and inflated with saline Airway suctioned Electronic Signature(s) Signed: 04/30/2022 [name] By: [name] RCP, RRT CHT , , Entered By: [name] on [name] 09:21:36 ,

## 2022-04-30 NOTE — Progress Notes (Addendum)
[name] ([id number]) [id number] Page 1 of 13 Visit Report for 04/30/2022 Chief Complaint Document Details Patient Name: Date of Service: [name] RO LD F. 04/30/2022 8:00 A M Medical Record Number: [id number] Patient Account Number: [id number] Date of Birth/Sex: Treating RN: 06-Jun-1961 (61 y.o. [name]) [name] Primary Care Provider: Royetta [name] Other Clinician: Referring Provider: Treating Provider/Extender: [name] in Treatment: 13 Information Obtained from: Patient Chief Complaint Left lateral foot ulcer Electronic Signature(s) Signed: 04/30/2022 8:27:20 AM By: [name] [name] Entered By: [name] on 04/30/2022 08:27:19 -------------------------------------------------------------------------------- Debridement Details Patient Name: Date of Service: [name] RO LD F. 04/30/2022 8:00 A M Medical Record Number: [id number] Patient Account Number: [id number] Date of Birth/Sex: Treating RN: [birth year] (61 y.o. [name]) [name] Primary Care Provider: Royetta [name] Other Clinician: Referring Provider: Treating Provider/Extender: [name] in Treatment: 13 Debridement Performed for Assessment: Wound #14 Left,Lateral Foot Performed By: Physician [name] Sams., [name] Debridement Type: Debridement Severity of Tissue Pre Debridement: Fat layer exposed Level of Consciousness (Pre-procedure): Awake and Alert Pre-procedure Verification/Time Out Yes - 08:30 Taken: Start Time: 08:30 T Area Debrided (L x W): otal 4.5 (cm) x 2.5 (cm) = 11.25 (cm) Tissue and other material debrided: Viable, Non-Viable, Slough, Subcutaneous, Skin: Dermis , Skin: Epidermis, Slough Level: Skin/Subcutaneous Tissue Debridement Description: Excisional Instrument: Curette Bleeding: Moderate Hemostasis Achieved: Pressure End Time: 08:32 Procedural Pain: 0 Post Procedural Pain: 0 Response to Treatment:  Procedure was tolerated well [name] ([id number]) [id number] Page 2 of 13 Level of Consciousness (Post- Awake and Alert procedure): Post Debridement Measurements of Total Wound Length: (cm) 4.5 Width: (cm) 2.5 Depth: (cm) 0.3 Volume: (cm) 2.651 Character of Wound/Ulcer Post Debridement: Improved Severity of Tissue Post Debridement: Fat layer exposed Post Procedure Diagnosis Same as Pre-procedure Electronic Signature(s) Signed: 04/30/2022 9:19:06 AM By: [name] Coria RN Signed: 04/30/2022 4:04:00 PM By: [name] [name] Entered By: [name] on 04/30/2022 08:30:58 -------------------------------------------------------------------------------- HPI Details Patient Name: Date of Service: [name] RO LD F. 04/30/2022 8:00 A M Medical Record Number: [id number] Patient Account Number: [id number] Date of Birth/Sex: Treating RN: March 02, 1961 (61 y.o. [name] Primary Care Provider: Royetta [name] Other Clinician: Referring Provider: Treating Provider/Extender: [name] in Treatment: 13 History of Present Illness HPI Description: Pleasant 61 year old with history of diabetes (Hgb A1c 10.8 in 2014) and peripheral neuropathy. No PVD. L ABI 1.1. Status post right great toe partial amputation years ago. He was at work and on 10/22/2014, was injured by a cart, and suffered an ulceration to his left anterior calf. He says that it subsequently became infected, and he was treated with a course of antibiotics. He was found on initial exam to have an ulceration on the dorsum of his left third toe. He was unaware of this and attributes it to pressure from his steel toed boots. More recently he injured his right anterior calf on a cart. Ambulating normally per his baseline. He has been undergoing regular debridements, applying mupirocin cream, and an Ace wrap for edema control. He returns to clinic for follow-up and is without  complaints. No pain. No fever or chills. No drainage. 10/25/15; this is a 61 year old man who has type II diabetes with diabetic polyneuropathy. He tells me that he fractured his left fifth metatarsal in June 2016 when he presented with swelling. He does not recall a specific injury. His hemoglobin A1c was apparently too high at the time  for consideration of surgery and he was put in some form of offloading. Ultimately he went to surgery in December with an allograft from his calcaneus to this site, plate and screws. He had an x-ray of the foot in March that showed concerns about nonunion. He tells me that in March he had to move and basically moved himself. He was on his foot a lot and then noticed some drainage from an open area. He has been following with his orthopedic surgeon Dr. Doran [name]. He has been applying a felt donut, dry dressing and using his heel healing sandal. 11/01/15; this is a patient I saw last week for the first time. He had a small open wound on the plantar aspect of his left foot at roughly the level of the base of his fifth metatarsal. He had a considerable degree of thickened skin around this wound on the plantar aspect which I thought was from chronic pressure on this area. He tells [place] that he had drainage over the course of the week. No systemic symptoms. 11/08/15; culture last week grew Citrobacter korseri. This should've been sensitive to the Augmentin I gave him. He has seen Dr. Doran [name] who did his initial surgery and according the patient the plan is to give this another month and then the hardware might need to come out of this. This seems like a reasonable plan. I will adjust his antibiotics to ciprofloxacin which probably should continue for at least another 2 weeks. I gave him 10 days worth today 11/22/15 the patient has completed antibiotics. He has an appointment with Dr. Doran [name] this Friday. There is improved dimensions around the wound on the left fifth metatarsal  base 11/29/15; the patient has completed antibiotics last week. Apparently his appointment with Dr. Doran [name] it is not until this Friday. Dimensions are roughly the same. 12/06/15; saw Dr. Doran [name]. No x-ray told the end of the month, next appointment June 30. We have been using Aquacel Ag 12/13/15: No major change this week. Using Aquacel AG 621/17; arrived this week with maceration around the wound. There was quite a bit of undermining which required surgical debridement. I changed him to [facility] last week, by the patient's admission he was up on this more this week 12/27/15; macerated tissue around the wound is removed with a scalpel and pickups. There is no undermining. Nonviable subcutaneous tissue and skin taken from the superior circumference of the wound is slough from the surface. READMISSION 03/06/16 since I last saw this patient at the end of June, he went for surgery on 01/11/16 by Dr. Doran [name] of orthopedics. He had a left foot irrigation and debridement, removal of hardware and placement of wound VAC. He is also been followed by Dr. Megan Salon of infectious disease and completed a six-week [name], [name] ([id number]) [id number] Page 3 of 13 course of IV Rocephin for group B strep and Enterobacter in the bone at the time of surgery. Apparently at the time of surgery the bone looked healthy so I don't think any bone was actually removed. He has been using silver alginate based dressings on the same wound area at the base of the left fifth metatarsal on the left. I note that he is also had arterial studies on 01/08/16, these showed a left ABI of 1.2 to and a right ABI of 1.3. Waveforms were listed as biphasic. He was not felt to have any specific arterial issues. 03/13/16; no real change in the condition of the wound at the left lateral foot  at roughly the base of his fifth metatarsal. Use silver alginate last week. 03/18/16 arrives today with no open area. Being  suspicious of the overlying callus I. Some of this back although I see nothing but covering tissue here/epithelium. There is no surrounding tenderness READMISSION 04/16/16 this is a patient I discharged about a month ago. Initially a surgical wound on the lateral aspect of his left foot which subsequently became infected. The story he is giving today that he went back into his own modified shoe started to notice pain 2 weeks ago he was seen in Dr. Nona Dell office by a physician assistant last Wednesday and according the patient was told that everything looked fine however this is clearly now broken open and he has an open wound in the same spot that we have been dealing with repetitively. Situation is complicated by the fact that he is running short of money on long-term disability. He has not taken his insulin and at least 2 weeks was previously on NovoLog short acting insulin on a sliding scale and TRESIBA 35 units at bedtime. He is no longer able to afford any of his medication he was in the x-ray on 10/15. A plain x-ray showed healed fracture of the left fifth metatarsal bone status post removal of the associated plate and screw fixation hardware. There was no acute appearing osseous abnormality. His blood work showed a white count of 9.2 with an essentially normal differential comprehensive metabolic panel was normal. Previous CT scan of the foot on 01/08/16 showed no osteomyelitis previous vascular workup showed no evidence of significant PAD on 01/13/16 04/23/16; culture I did last week grew enterococcus [ampicillin sensitive] and MRSA. He saw infectious disease yesterday. They stop the clindamycin and ordered an MRI. This is not unreasonable. All the hardware is out of the foot at this point. 04/30/16 at this point in time we are still awaiting the results of the MRI at this point in time. Patient did have an area which appears to be somewhat macerated in the proximal portion of the wound where  there is overlying necrotic skin that is doing nothing more than trapping fluid underneath. He continues to state that he does have some discomfort and again the concern is for the possibility of osteomyelitis hence the reason for the MRI order. We have been using a silver alginate dressing but again I think the reason this with his macerated as it was is that the dressing obscene could not reach the entirety of the wound due to some necrotic skin covering the proximal portion. No bleeding noted at this point in time on initial evaluation. 05/07/16 we receive the results of patient's MRI which shows that he has no evidence of osteomyelitis. This obviously is excellent news. Patient was definitely happy to hear this. He tells me the wound appears to be doing very well at this point in time and is pleased with the progress currently. 05/15/16; as noted the patient did not have osteomyelitis. He has been released by infectious disease and orthopedics. His wound is still open he had a debridement last week but complain when he got home he "bleeding for half a day". He is not had any pain. We have been using silver alginate with Kerlix gauze wrap. 05/28/16; patient arrives today with the wound in much the same condition as last time. He has a small opening on the lateral aspect of his foot however with debridement there is clear undermining medially there is no real evidence of  infection here and I didn't see any point in culturing this. One would have to wonder if this isn't a simple matter of in adequate offloading as he is been using a healing sandal. 06/05/16; the open area is now on the plantar aspect of his foot and not decide. The wound almost appears to have "migrated". This was the term use by our intake nurse. 06/12/16; open area on the plantar aspect of his foot. Base of the wound looks very healthy. This will be his second week in a total contact cast 06/19/16; patient arrived today in a total  contact cast. There was some expectation from our staff and myself that this area would be healed. Unfortunately the area was boggy and with rec pressure a fairly substantial amount of purulent drainage was obtained. Specimen obtained for culture. The patient had no complaints of systemic problems including fever or chills or instability of his diabetes. There was no pain in the foot. Nevertheless a extensive debridement was required. 06/26/16; patient's culture from the abscess last week grew a combination of MRSA and ampicillin sensitive enterococcus. I had him on Augmentin and Septra however I have elected to give him a full 10 day course of Zyvox instead as I Recently treated this combination of organisms with Augmentin and Septra before. Arrives today with no systemic symptoms 07/03/16; the patient has 2 more treatments of Zyvox and then he is finished antibiotics the wound has improved now mostly on the lateral aspect of his foot. There is still some tenderness when he walks. 07/10/16; patient arrives today with Zyvox completed. He only has a small open area remaining. 07/24/16; she arrives here with no overt open area. The covering is thick callus/eschar. Nevertheless there is no open area here. He has some tenderness underneath the area but no overt infection is observed no drainage. The patient has a deformity in the foot with this area weekly to be exposed to more pressure in his foot where nevertheless that something we are going to have to deal with going forward. The patient has diabetic workboots and diabetic shoes. He has had a long difficult course with the area here. This started as a fracture at work. He had bone grafting from his calcaneus and screws. This got infected he had to have more surgery on the area. Bone at the time of that surgery I think showed enterococcus and group B strep. He had 6 weeks of Rocephin. Since then the area has waxed and waned in its difficulty. Recently he  had an MRI in December that did not show osteomyelitis nevertheless he had an abscess that grew MRSA and enterococcus which I elected to treat with Zyvox. This was in December and this wound is actually "healed" over 08/21/16; the patient came in today for his one-month follow-up visit. The area on the lateral aspect of his left foot looks much the same as some month ago. There is no evidence of an open wound here. However the patient tells me about a week after he went back to work he developed severe pain and swelling in the plantar aspect of his right foot first and fifth metatarsal heads. 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 He went to see his podiatrist at [facility]. She told him that he could not work on his feet. She told him to go back and his cam walking boot on the left. Not  have open wounds obviously on the right. The patient is actually gone ahead and retired from his job because he does not feel he can work on his feet 09/18/16; patient comes back in saying he recently had some pain on the lateral aspect of his left foot. Asked that we look at this. Other than that all of his wounds have a viable surface. He has diabetic shoes. He is retired from work. READMISSION [name] is a 61 year old man who we cared for for a prolonged period of time in late 2017 into March 2018. At that point he had suffered a fracture of his left fifth metatarsal at about the level of the base of the fifth metatarsal. He had surgical repair by Dr. Meryl Dare and he developed a very refractory wound over the fifth metatarsal. Ultimately this became infected he required hardware removal this eventually closed over and we discharged from the clinic in March of this year. He tells me as well until roughly 3 weeks ago when he developed increasing pain in the same area making it difficult for him to sleep. He was admitted to  hospital from 9/5 through 9/7 with now an ulcer in the same area on the lateral aspect of his left foot at the level of the base of the metatarsal. X- rays and MRIs during this hospitalization were negative. Wound culture showed a combination of Escherichia coli, Morganella and Pseudomonas. He was given IV Cipro and vancomycin the hospital and then discharged home on Cipro and Doxy. He returned to the ER on 9/16 with leg swelling and discomfort. I don't think anything was added at that point although he did have an ultrasound of the left leg that was negative for DVT He was back in the ER on 10/4 again with . pain in the area he was given Bactrim but states he had a significant allergic reaction to that which has abated once he stopped the Bactrim. I don't think he had a full course. He went to see his podiatrist yesterday who recommended some form of foot soak. He has a Darco forefoot offloading boot The patient is a type II diabetic on insulin. T me his recent hemoglobin A1c was 7. Arterial studies done in July 2017 showed an ABI in the right of 1.3 on the ells left 1.2 to biphasic waveforms bilaterally. He was not felt to have arterial disease. As noted the patient has had 2 MRIs most recently on 10/4 this showed no evidence of an abscess or osteomyelitis and no change since the prior study of 03/05/17 there was nonspecific edema on the dorsum of the foot. ABIs in this clinic today 1.2 which would be unchanged from his study done in July/17 04/15/17; now down to 1 small wound on the left lateral foot. We wrapped him and applied collagen last week and the foot is fairly macerated. 04/22/17; small wound on the left lateral foot however it has some undermining. Using collagen 04/29/17; small wound on the left lateral foot some surrounding callus. Chronic damage in this area as a result of initial fracture nonhealing and secondary infection. 05/06/17; the patient arrives today with the area on the left  lateral foot closed. There is thick subcutaneous tissue with a layer of callus. I took some of the callus off just to ensure adequate closure of the underlying tissue and there is no open wound here. He has considerable deformity of this area of his foot however and unless there is an ability to offload  this I think opening is going to be recurrent. He tells me he has diabetic foot wear although I have not actually [name], [name] ([id number]) [id number] Page 4 of 13 seen this Readmission: 09/02/17 on evaluation today patient appears to be doing a little better compared to what he tells me what's going on in regard to his left lateral foot. He was previously seen in the ER this past Saturday it is now Tuesday. On Saturday he actually was treated with antibiotics including Cipro 500 mg two times a day and doxycycline 100 mg two times a day. Subsequently he also did have an x-ray performed of his left foot which revealed increased degenerative changes of the base of the fifth metatarsal since comparison in 2018. There was no radiographic evidence of osteomyelitis however. Patient has previously had an MRI of the left foot which was performed on 04/03/17 in this revealed at that point a soft tissue ulceration but no evidence of osteomyelitis. Patient has previously undergone testing in regard to his ABI's by Dr. Bridgett Larsson and this showed that he did have ABI was within normal limits which does correspond with ABI's we checked here in the office today. That study was on 01/13/16. With all that being said on evaluation today there does not appear to be any evidence of a true open wound of the left lateral fifth metatarsal region. He does have tenderness noted as well is a little bit of boggy/fluctuance feeling to the area in this region of his foot and there is definitely a very solid and firm callous noted laterally. With that being said I am not able to find any obvious opening at this  point there does appear to be a spot where there appears to been a opening very recently however patient states he has not noted any drainage from this currently. He does however have discomfort with palpation of this area this is in the 3-4/10 range only with very firm palpation this is not too significant at all. Subsequently he does have a small ulcer at the medial nail bed of the right second toe where he inadvertently pulled off a portion of the toenail softly causing this injury. Otherwise there does not appear to be any evidence of infection in regard to this right second toe. 09/23/17 on evaluation today patient actually appears to be doing well he has no open ulcerations noted at this point. With that being said he did have significant callous noted over the left lateral foot which did require callous pairing today he tolerated this without complication. Readmission: 11/07/17 patient presents today for readmission concerning to ulcers that he has. One on the right plantar fifth metatarsal region and the other on the left plantar fifth metatarsal region. He has previously had an x-ray which was performed on 11/05/17 and revealed that the patient had wounds noted of the base of the fifth metatarsal region without evidence of destruction to the bone to suggest osteomyelitis. Obviously this is excellent news. With that being said he does have a significantly large ulcer that extensively plantar surface of the wound and is concerning for the fact that he continues to walk on this due to the patient telling me that he "has to work" I do believe this has likely led to both the opening of the wound as well as the fact that is not really able to heal very well at this point. He has been seen by his podiatrist and apparently according to the note dated 10/23/17  the patient was provided with Santyl and recommended a dry dressing. No wound culture has been obtained at this point. As stated above he has had  returned to work due to financial reasons and is not able to wear his postop shoes due to the fact that he cannot wear this and work. The wound is stated to have been present for "several weeks" prior to the visit with the podiatrist on 10/23/17. With that being said again at least the good news is there does not appear to be any evidence of osteomyelitis. 11/14/17 on evaluation today patient appears to be doing poorly in regard to his bilateral foot ulcers. Unfortunately he seems to have significant callous with a lot of maceration underlined especially on the right even more so than the left. He has been tolerating the dressing changes without complication. With that being said overall I do think he's gonna have to have a significant debridement today. 11/21/17 on evaluation today patient presents for follow-up concerning his bilateral lateral foot ulcers. He has been tolerating the dressing changes without complication. His wife helps perform these for him. With that being said the patient states that the left lateral foot ulcer actually is continuing to give him trouble as far as discomfort is concerned. With that being said actually appears better. 11/28/17 on evaluation today patient appears to be doing poorly in regard to his left lower extremity ulcer in particular. His right as made some progress on the lateral portion although the plantar portion is actually getting somewhat worse I think this is likely due to the fact that he continues to work although it hurts and he's been having a lot of issues out of this side and as far as pain is concerned. With that being said my biggest concern on evaluation today is that of the patient having issues with increased pain in the left lower extremity ulcer site especially on the plantar aspect. He also appears to have what seems to be an abscess developing in the dorsal/lateral portion of his foot which I think likely is communicating somewhere internally  with the ulcer site. Nonetheless he has not had any fevers, chills, nausea, or vomiting noted at this time. He has however had fairly significant increase in pain and he was Artie having a lot of pain previous. Readmission: 01-29-2022 upon evaluation today patient presents for initial inspection here in our clinic concerning the current issue which has been present since around Oct 29, 2021. The patient tells me that he has at this point been seen by Dr. Vickki Muff who has been managing this up to this point. He has also been seen by Dr. Lazaro Arms I will outline the findings there shortly as well. With that being said currently according to Dr. Alvera Singh note on 12-18-2021 the patient has a prescription for Zosyn that was given by infectious disease. Again that was back in June however. There was a hospital encounter for incision and drainage and debridement of the foot in order to clearway necrotic tissue. The patient also underwent vascular evaluation with Dr. Lucky Cowboy. Nonetheless on the postop visit on 12-18-2021 the patient was seeming to be doing quite well based on what Dr. Vickki Muff said but nonetheless since that time is really not made much progress and is concerned about losing his foot. He therefore requested a referral to wound care to be seen and evaluated and see if there is anything we can do to help and aid with getting this to heal. Fortunately there does  not appear to be any evidence of active infection locally or systemically at this time which is great news. He tells me that he is currently using Betadine wet-to-dry dressings. He does not note any systemic infections but does have evidence of local infection based on what we are seeing. He has had a recent chest x-ray which showed that he had no evidence of acute cardiopulmonary disease that was performed on 12-12-2021. He also underwent vascular intervention with Dr. 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 Subsequently the patient states that Dr. Lucky Cowboy has mentioned that he would need to go back at some time with Dr. Lucky Cowboy said he would consider a posterior tibial approach for revascularization in the future but at this time because of kidney status and the amount of dye used he did not want to proceed any further at that point. Again that something that would likely be scheduled down the road. Nonetheless right now the patient does have an ABI on the left of 0.90 with a TBI of 0.6-0 but this was decreased compared to previous. His ABI on the right was 1.08. Patient did have as well MRI of the left foot which was performed on 12-12-2021 and this shows that he does have interval resection of the proximal third of the fifth metatarsal and area of osteomyelitis on prior MRI. There is persistent ulceration of plantar lateral aspect at the resection site with a sinus tract extending towards the adjacent cuboid bone findings suspicious for osteomyelitis of the plantar lateral aspect of the cuboid and adjacent base of the fourth metatarsal possibly in the proximal most aspect of the remaining fifth metatarsal shaft as well. The ABI performed on 12-13-2021 showed that the left ABI was 0.83 which had previously been registered at 1.35 and the right ABI was 1.20. I think this is consistent with being able to heal but obviously would do better with vascular intervention not something that is going to be looked into in the future. This was noted above as well. Based on what I am seeing today I do believe that the patient is definitely a candidate for HBO therapy I think this may be in his best interest as I think he is at the point of being in a limb threatening situation. I do believe that he would benefit from hyperbarics along with continued antibiotics along with good wound care measures as well. 02-04-2022 upon  evaluation today patient appears to be doing still poorly in regard to his foot he is having a lot of drainage and he notes odor as well. With that being said we still have not heard anything back from insurance yet as far as approval for HBO therapy. Subsequently the patient does have evidence of osteomyelitis and I think hyperbarics is definitely going to be something that would be beneficial for him. He has been on antibiotics several times previous but unfortunately this just does not seem to be sustained enough we did do a culture it showed Pseudomonas as one of the primary organisms both organisms would be treated with Levaquin which I think is going to be the best way to go. I discussed that with the patient today and I [name] send that into the pharmacy. 02-11-2022 upon evaluation today patient appears to be doing well currently in regard to  his wound. I do feel like the Levaquin is doing better for him which is great news. Fortunately I do not see any evidence of active infection locally or systemically at this time which is great news. No fevers, chills, nausea, vomiting, or diarrhea. 02-19-2022 upon evaluation today patient appears to be doing excellent in regard to his foot ulcer all things considered he still has bone exposed but the good news is we are at least making some progress here I think he does have a lot of odor he tells me and I think switching over to Dakin's moistened gauze dressing is probably can be the way to go. 02-26-2022 upon evaluation today patient appears to be doing well currently in regard to his wound all things considered. Although the 1 thing I do see is that he CORRY, IHNEN ([id number]) [id number] Page 5 of 13 is not changing the dressing as frequently as he should be. Fortunately there does not appear to be any signs of active infection he tells me his wife is out of town from Saturday through today so it has not been changed since  Saturday before that they have been doing this every other day. I explained that with the Dakin's he needs to do this every day in order to make sure that it heals appropriately. This will also help with the odor which she has been complaining of. 03-11-2022 upon evaluation today patient appears to be doing better overall visually in regard to his wound. He is tolerating the dressing changes without complication. Fortunately there does not appear to be any evidence of active infection locally or systemically at this time. No fevers, chills, nausea, vomiting, or diarrhea. 03-19-2022 upon evaluation today patient appears to be doing decently well in regard to his wound. He is actually tolerating dressing changes without Complication. Fortunately there does not appear to be any signs of active infection locally or systemically at this time which is great news. No fevers, chills, nausea, vomiting, or diarrhea. 04-02-2022 upon evaluation today patient's wound is actually showing signs of significant improvement I am actually very pleased with where we stand today. I do not see any evidence of infection locally or systemically at this time which is great news. 04-08-2022 upon evaluation today patient actually appears to be making good progress. I am very pleased with where things stand his wound is smaller he seems to be tolerating hyperbarics well he is doing well with wound care and in general I think we are making excellent headway here. I do believe that he would likely benefit from extending the hyperbarics. I think that he is making such good progress I do not want to see anything backtrack on him at this point. Overall the benefit coupled with the antibiotics and an excellent wound care that we have been working with him with and that his wife is continuing to do at home I think he is making a great impact in getting this healed to prevent limb loss. 04-16-2022 upon evaluation today patient appears to be  doing excellent with the [facility] dressing. Coupled with hyperbarics he is managing quite nicely and healing quite nicely. Fortunately I do not see any evidence of active infection locally or systemically which is great news and overall I am extremely pleased with where we stand today. No fevers, chills, nausea, vomiting, or diarrhea. 04-23-2022 upon evaluation today patient appears to be doing well currently in regard to his wound for the most part although there is some area  of rubbing on the lateral aspect as well as the top of his foot. When questioned he actually has been more active over the weekend he tells me that it was his birthday and he had some friends over. Subsequently I think this is what led to things being somewhat worse compared to what they were previous. Fortunately I do not see any evidence of active infection locally or systemically at this time which is great news. No fevers, chills, nausea, vomiting, or diarrhea. We are still waiting on the approval for continuation of HBO therapy. 04-30-2022 upon evaluation today patient's wound is actually showing signs of improvement. He continues to utilize the knee scooter which I think is doing well for him. The volume continues to improve the size got a little worse last week due to the fact that he was up on this more. The good news is he is actually back down to more normal situation at this point today. There is some need for sharp debridement. Electronic Signature(s) Signed: 04/30/2022 4:02:09 PM By: [name] [name] Entered By: [name] on 04/30/2022 16:02:09 -------------------------------------------------------------------------------- Physical Exam Details Patient Name: Date of Service: [name] RO LD F. 04/30/2022 8:00 A M Medical Record Number: [id number] Patient Account Number: [id number] Date of Birth/Sex: Treating RN: 03-20-1961 (61 y.o. [name] Primary Care Provider: Royetta [name] Other  Clinician: Referring Provider: Treating Provider/Extender: [name] in Treatment: 73 Constitutional Well-nourished and well-hydrated in no acute distress. Respiratory normal breathing without difficulty. Psychiatric this patient is able to make decisions and demonstrates good insight into disease process. Alert and Oriented x 3. pleasant and cooperative. Notes Upon inspection patient's wound bed actually showed signs of good granulation and epithelization at this point. Fortunately I do not see any evidence of infection locally or systemically which is great news. No fevers, chills, nausea, vomiting, or diarrhea. I did perform sharp debridement clearway some of the necrotic debris today. This included callus around the edges of the wound and slough and biofilm on the surface of the wound down to good subcutaneous tissue. He tolerated this without complication postdebridement the wound bed appears to be doing much better pretty much across the board. Electronic Signature(s) Signed: 04/30/2022 4:02:47 PM By: [name] [name] Entered By: [name] on 04/30/2022 16:02:47 [name] ([id number]) [id number] Page 6 of 13 -------------------------------------------------------------------------------- Physician Orders Details Patient Name: Date of Service: [name] RO LD F. 04/30/2022 8:00 A M Medical Record Number: [id number] Patient Account Number: [id number] Date of Birth/Sex: Treating RN: Feb 06, 1961 (61 y.o. [name] Primary Care Provider: Royetta [name] Other Clinician: Referring Provider: Treating Provider/Extender: [name] in Treatment: [phone] Verbal / Phone Orders: No Diagnosis Coding Follow-up Appointments Return Appointment in 1 week. Bathing/ Shower/ Hygiene May shower with wound dressing protected with water repellent cover or cast protector. Anesthetic (Use 'Patient Medications'  Section for Anesthetic Order Entry) Lidocaine applied to wound bed Edema Control - Lymphedema / Segmental Compressive Device / Other Elevate, Exercise Daily and A void Standing for Long Periods of Time. Elevate legs to the level of the heart and pump ankles as often as possible Elevate leg(s) parallel to the floor when sitting. Off-Loading Open toe surgical shoe Other: - knee scooter Additional Orders / Instructions Other: - need daily dressing changes Hyperbaric Oxygen Therapy Wound #14 Left,Lateral Foot Indication and location: - Left foot osteomyelitis If appropriate for treatment, begin HBOT per protocol: 2.0 ATA for 90  Minutes without A Breaks ir One treatment per day (delivered Monday through Friday unless otherwise specified in Special Instructions below): Total # of Treatments: - 40 additional 40 requested for a total of 80 A ntihistamine 30 minutes prior to HBO Treatment, difficulty clearing ears. Finger stick Blood Glucose Pre- and Post- HBOT Treatment. Follow Hyperbaric Oxygen Glycemia Protocol Wound Treatment Wound #14 - Foot Wound Laterality: Left, Lateral Cleanser: Byram Ancillary Kit - 15 Day Supply (Generic) 3 x Per Week/30 Days Discharge Instructions: Use supplies as instructed; Kit contains: (15) Saline Bullets; (15) 3x3 Gauze; 15 pr Gloves Cleanser: Wound Cleanser 3 x Per Week/30 Days Discharge Instructions: Wash your hands with soap and water. Remove old dressing, discard into plastic bag and place into trash. Cleanse the wound with Wound Cleanser prior to applying a clean dressing using gauze sponges, not tissues or cotton balls. Do not scrub or use excessive force. Pat dry using gauze sponges, not tissue or cotton balls. Prim Dressing: Hydrofera Blue Ready Transfer Foam, 4x5 (in/in) (Dispense As Written) 3 x Per Week/30 Days ary Discharge Instructions: Apply Hydrofera Blue Ready to wound bed as directed Secondary Dressing: ABD Pad 5x9 (in/in) (Generic) 3 x Per  Week/30 Days Discharge Instructions: Cover with ABD pad Secured With: Medipore T - 75M Medipore H Soft Cloth Surgical T ape ape, 2x2 (in/yd) (Generic) 3 x Per Week/30 Days Secured With: [employer] Sterile or Non-Sterile 6-ply 4.5x4 (yd/yd) (Generic) 3 x Per Week/30 Days Discharge Instructions: Apply Kerlix as directed [name], [name] ([id number]) [id number] Page 7 of 13 GLYCEMIA INTERVENTIONS PROTOCOL PRE-HBO GLYCEMIA INTERVENTIONS ACTION INTERVENTION Obtain pre-HBO capillary blood glucose (ensure 1 physician order is in chart). A. Notify HBO physician and await physician orders. 2 If result is 70 mg/dl or below: B. If the result meets the hospital definition of a critical result, follow hospital policy. A. Give patient an 8 ounce Glucerna Shake, an 8 ounce Ensure, or 8 ounces of a Glucerna/Ensure equivalent dietary supplement*. B. Wait 30 minutes. If result is 71 mg/dl to 130 mg/dl: C. Retest patients capillary blood glucose (CBG). D. If result greater than or equal to 110 mg/dl, proceed with HBO. If result less than 110 mg/dl, notify HBO physician and consider holding HBO. If result is 131 mg/dl to 249 mg/dl: A. Proceed with HBO. A. Notify HBO physician and await physician orders. B. It is recommended to hold HBO and do If result is 250 mg/dl or greater: blood/urine ketone testing. C. If the result meets the hospital definition of a critical result, follow hospital policy. POST-HBO GLYCEMIA INTERVENTIONS ACTION INTERVENTION Obtain post HBO capillary blood glucose (ensure 1 physician order is in chart). A. Notify HBO physician and await physician orders. 2 If result is 70 mg/dl or below: B. If the result meets the hospital definition of a critical result, follow hospital policy. A. Give patient an 8 ounce Glucerna Shake, an 8 ounce Ensure, or 8 ounces of a Glucerna/Ensure equivalent dietary supplement*. B. Wait 15 minutes for  symptoms of If result is 71 mg/dl to 100 mg/dl: hypoglycemia (i.e. nervousness, anxiety, sweating, chills, clamminess, irritability, confusion, tachycardia or dizziness). C. If patient asymptomatic, discharge patient. If patient symptomatic, repeat capillary blood glucose (CBG) and notify HBO physician. If result is 101 mg/dl to 249 mg/dl: A. Discharge patient. A. Notify HBO physician and await physician orders. B. It is recommended to do blood/urine ketone If result is 250 mg/dl or greater: testing. C. If the result meets the hospital definition of a critical result,  follow hospital policy. *Juice or candies are NOT equivalent products. If patient refuses the Glucerna or Ensure, please consult the hospital dietitian for an appropriate substitute. Electronic Signature(s) Signed: 04/30/2022 9:19:06 AM By: [name] Coria RN Signed: 04/30/2022 4:04:00 PM By: [name] [name] Entered By: [name] on 04/30/2022 08:28:16 -------------------------------------------------------------------------------- Problem List Details Patient Name: Date of Service: [name] RO LD F. 04/30/2022 8:00 A M Medical Record Number: [id number] Patient Account Number: [id number] Date of Birth/Sex: Treating RN: 1961/04/13 (61 y.o. [name] Primary Care Provider: Royetta [name] Other Clinician: [name] ([id number]) [id number] Page 8 of 13 Referring Provider: Treating Provider/Extender: [name] in Treatment: 13 Active Problems ICD-10 Encounter Code Description Active Date MDM Diagnosis M86.372 Chronic multifocal osteomyelitis, left ankle and foot 01/29/2022 No Yes E11.621 Type 2 diabetes mellitus with foot ulcer 01/29/2022 No Yes L97.524 Non-pressure chronic ulcer of other part of left foot with necrosis of bone 01/29/2022 No Yes T81.31XA Disruption of external operation (surgical) wound, not elsewhere classified, 01/29/2022 No Yes initial  encounter Inactive Problems Resolved Problems Electronic Signature(s) Signed: 04/30/2022 8:27:16 AM By: [name] [name] Entered By: [name] on 04/30/2022 08:27:16 -------------------------------------------------------------------------------- Progress Note Details Patient Name: Date of Service: [name] RO LD F. 04/30/2022 8:00 A M Medical Record Number: [id number] Patient Account Number: [id number] Date of Birth/Sex: Treating RN: 1961-06-15 (61 y.o. [name] Primary Care Provider: Royetta [name] Other Clinician: Referring Provider: Treating Provider/Extender: [name] in Treatment: 13 Subjective Chief Complaint Information obtained from Patient Left lateral foot ulcer History of Present Illness (HPI) Pleasant 61 year old with history of diabetes (Hgb A1c 10.8 in 2014) and peripheral neuropathy. No PVD. L ABI 1.1. Status post right great toe partial amputation years ago. He was at work and on 10/22/2014, was injured by a cart, and suffered an ulceration to his left anterior calf. He says that it subsequently became infected, and he was treated with a course of antibiotics. He was found on initial exam to have an ulceration on the dorsum of his left third toe. He was unaware of this and attributes it to pressure from his steel toed boots. More recently he injured his right anterior calf on a cart. Ambulating normally per his baseline. He has been undergoing regular debridements, applying mupirocin cream, and an Ace wrap for edema control. He returns to clinic for follow-up and is without complaints. No pain. No fever or chills. No drainage. 10/25/15; this is a 61 year old man who has type II diabetes with diabetic polyneuropathy. He tells me that he fractured his left fifth metatarsal in June 2016 when he presented with swelling. He does not recall a specific injury. His hemoglobin A1c was apparently too high at the time for consideration  of surgery and [name], [name] ([id number]) [id number] Page 9 of 13 he was put in some form of offloading. Ultimately he went to surgery in December with an allograft from his calcaneus to this site, plate and screws. He had an x-ray of the foot in March that showed concerns about nonunion. He tells me that in March he had to move and basically moved himself. He was on his foot a lot and then noticed some drainage from an open area. He has been following with his orthopedic surgeon Dr. Doran [name]. He has been applying a felt donut, dry dressing and using his heel healing sandal. 11/01/15; this is a patient I saw last week for the first time.  He had a small open wound on the plantar aspect of his left foot at roughly the level of the base of his fifth metatarsal. He had a considerable degree of thickened skin around this wound on the plantar aspect which I thought was from chronic pressure on this area. He tells [place] that he had drainage over the course of the week. No systemic symptoms. 11/08/15; culture last week grew Citrobacter korseri. This should've been sensitive to the Augmentin I gave him. He has seen Dr. Doran [name] who did his initial surgery and according the patient the plan is to give this another month and then the hardware might need to come out of this. This seems like a reasonable plan. I will adjust his antibiotics to ciprofloxacin which probably should continue for at least another 2 weeks. I gave him 10 days worth today 11/22/15 the patient has completed antibiotics. He has an appointment with Dr. Doran [name] this Friday. There is improved dimensions around the wound on the left fifth metatarsal base 11/29/15; the patient has completed antibiotics last week. Apparently his appointment with Dr. Doran [name] it is not until this Friday. Dimensions are roughly the same. 12/06/15; saw Dr. Doran [name]. No x-ray told the end of the month, next appointment June 30. We have been using Aquacel  Ag 12/13/15: No major change this week. Using Aquacel AG 621/17; arrived this week with maceration around the wound. There was quite a bit of undermining which required surgical debridement. I changed him to [facility] last week, by the patient's admission he was up on this more this week 12/27/15; macerated tissue around the wound is removed with a scalpel and pickups. There is no undermining. Nonviable subcutaneous tissue and skin taken from the superior circumference of the wound is slough from the surface. READMISSION 03/06/16 since I last saw this patient at the end of June, he went for surgery on 01/11/16 by Dr. Doran [name] of orthopedics. He had a left foot irrigation and debridement, removal of hardware and placement of wound VAC. He is also been followed by Dr. Megan Salon of infectious disease and completed a six-week course of IV Rocephin for group B strep and Enterobacter in the bone at the time of surgery. Apparently at the time of surgery the bone looked healthy so I don't think any bone was actually removed. He has been using silver alginate based dressings on the same wound area at the base of the left fifth metatarsal on the left. I note that he is also had arterial studies on 01/08/16, these showed a left ABI of 1.2 to and a right ABI of 1.3. Waveforms were listed as biphasic. He was not felt to have any specific arterial issues. 03/13/16; no real change in the condition of the wound at the left lateral foot at roughly the base of his fifth metatarsal. Use silver alginate last week. 03/18/16 arrives today with no open area. Being suspicious of the overlying callus I. Some of this back although I see nothing but covering tissue here/epithelium. There is no surrounding tenderness READMISSION 04/16/16 this is a patient I discharged about a month ago. Initially a surgical wound on the lateral aspect of his left foot which subsequently became infected. The story he is giving today that he went  back into his own modified shoe started to notice pain 2 weeks ago he was seen in Dr. Nona Dell office by a physician assistant last Wednesday and according the patient was told that everything looked fine however this is clearly now  broken open and he has an open wound in the same spot that we have been dealing with repetitively. Situation is complicated by the fact that he is running short of money on long-term disability. He has not taken his insulin and at least 2 weeks was previously on NovoLog short acting insulin on a sliding scale and TRESIBA 35 units at bedtime. He is no longer able to afford any of his medication he was in the x-ray on 10/15. A plain x-ray showed healed fracture of the left fifth metatarsal bone status post removal of the associated plate and screw fixation hardware. There was no acute appearing osseous abnormality. His blood work showed a white count of 9.2 with an essentially normal differential comprehensive metabolic panel was normal. Previous CT scan of the foot on 01/08/16 showed no osteomyelitis previous vascular workup showed no evidence of significant PAD on 01/13/16 04/23/16; culture I did last week grew enterococcus [ampicillin sensitive] and MRSA. He saw infectious disease yesterday. They stop the clindamycin and ordered an MRI. This is not unreasonable. All the hardware is out of the foot at this point. 04/30/16 at this point in time we are still awaiting the results of the MRI at this point in time. Patient did have an area which appears to be somewhat macerated in the proximal portion of the wound where there is overlying necrotic skin that is doing nothing more than trapping fluid underneath. He continues to state that he does have some discomfort and again the concern is for the possibility of osteomyelitis hence the reason for the MRI order. We have been using a silver alginate dressing but again I think the reason this with his macerated as it was is that the  dressing obscene could not reach the entirety of the wound due to some necrotic skin covering the proximal portion. No bleeding noted at this point in time on initial evaluation. 05/07/16 we receive the results of patient's MRI which shows that he has no evidence of osteomyelitis. This obviously is excellent news. Patient was definitely happy to hear this. He tells me the wound appears to be doing very well at this point in time and is pleased with the progress currently. 05/15/16; as noted the patient did not have osteomyelitis. He has been released by infectious disease and orthopedics. His wound is still open he had a debridement last week but complain when he got home he "bleeding for half a day". He is not had any pain. We have been using silver alginate with Kerlix gauze wrap. 05/28/16; patient arrives today with the wound in much the same condition as last time. He has a small opening on the lateral aspect of his foot however with debridement there is clear undermining medially there is no real evidence of infection here and I didn't see any point in culturing this. One would have to wonder if this isn't a simple matter of in adequate offloading as he is been using a healing sandal. 06/05/16; the open area is now on the plantar aspect of his foot and not decide. The wound almost appears to have "migrated". This was the term use by our intake nurse. 06/12/16; open area on the plantar aspect of his foot. Base of the wound looks very healthy. This will be his second week in a total contact cast 06/19/16; patient arrived today in a total contact cast. There was some expectation from our staff and myself that this area would be healed. Unfortunately the area was boggy  and with rec pressure a fairly substantial amount of purulent drainage was obtained. Specimen obtained for culture. The patient had no complaints of systemic problems including fever or chills or instability of his diabetes. There was no  pain in the foot. Nevertheless a extensive debridement was required. 06/26/16; patient's culture from the abscess last week grew a combination of MRSA and ampicillin sensitive enterococcus. I had him on Augmentin and Septra however I have elected to give him a full 10 day course of Zyvox instead as I Recently treated this combination of organisms with Augmentin and Septra before. Arrives today with no systemic symptoms 07/03/16; the patient has 2 more treatments of Zyvox and then he is finished antibiotics the wound has improved now mostly on the lateral aspect of his foot. There is still some tenderness when he walks. 07/10/16; patient arrives today with Zyvox completed. He only has a small open area remaining. 07/24/16; she arrives here with no overt open area. The covering is thick callus/eschar. Nevertheless there is no open area here. He has some tenderness underneath the area but no overt infection is observed no drainage. The patient has a deformity in the foot with this area weekly to be exposed to more pressure in his foot where nevertheless that something we are going to have to deal with going forward. The patient has diabetic workboots and diabetic shoes. He has had a long difficult course with the area here. This started as a fracture at work. He had bone grafting from his calcaneus and screws. This got infected he had to have more surgery on the area. Bone at the time of that surgery I think showed enterococcus and group B strep. He had 6 weeks of Rocephin. Since then the area has waxed and waned in its difficulty. Recently he had an MRI in December that did not show osteomyelitis nevertheless he had an abscess that grew MRSA and enterococcus which I elected to treat with Zyvox. This was in December and this wound is actually "healed" over 08/21/16; the patient came in today for his one-month follow-up visit. The area on the lateral aspect of his left foot looks much the same as some month  ago. There is no evidence of an open wound here. However the patient tells me about a week after he went back to work he developed severe pain and swelling in the plantar aspect of his right foot first and fifth metatarsal heads. 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 He went to see his podiatrist at [facility]. She told him that he could not work on his feet. She told him to go back and his cam walking boot on the left. Not have open wounds obviously on the right. The patient is actually gone ahead and retired from his job because he does not feel he can work on his feet 09/18/16; patient comes back in saying he recently had some pain on the lateral aspect of his left foot. Asked that we look at this. Other than that all of his wounds have a viable surface. He has diabetic shoes. He is retired from work. KRISTION, HOLIFIELD ([id number]) [id number] Page 10 of [place] is a 61 year old man who we cared for for a prolonged period of time in late 2017 into March 2018. At that point he had suffered a fracture of his left fifth metatarsal at about the level  of the base of the fifth metatarsal. He had surgical repair by Dr. Meryl Dare and he developed a very refractory wound over the fifth metatarsal. Ultimately this became infected he required hardware removal this eventually closed over and we discharged from the clinic in March of this year. He tells me as well until roughly 3 weeks ago when he developed increasing pain in the same area making it difficult for him to sleep. He was admitted to hospital from 9/5 through 9/7 with now an ulcer in the same area on the lateral aspect of his left foot at the level of the base of the metatarsal. X- rays and MRIs during this hospitalization were negative. Wound culture showed a combination of Escherichia coli, Morganella and  Pseudomonas. He was given IV Cipro and vancomycin the hospital and then discharged home on Cipro and Doxy. He returned to the ER on 9/16 with leg swelling and discomfort. I don't think anything was added at that point although he did have an ultrasound of the left leg that was negative for DVT He was back in the ER on 10/4 again with . pain in the area he was given Bactrim but states he had a significant allergic reaction to that which has abated once he stopped the Bactrim. I don't think he had a full course. He went to see his podiatrist yesterday who recommended some form of foot soak. He has a Darco forefoot offloading boot The patient is a type II diabetic on insulin. T me his recent hemoglobin A1c was 7. Arterial studies done in July 2017 showed an ABI in the right of 1.3 on the ells left 1.2 to biphasic waveforms bilaterally. He was not felt to have arterial disease. As noted the patient has had 2 MRIs most recently on 10/4 this showed no evidence of an abscess or osteomyelitis and no change since the prior study of 03/05/17 there was nonspecific edema on the dorsum of the foot. ABIs in this clinic today 1.2 which would be unchanged from his study done in July/17 04/15/17; now down to 1 small wound on the left lateral foot. We wrapped him and applied collagen last week and the foot is fairly macerated. 04/22/17; small wound on the left lateral foot however it has some undermining. Using collagen 04/29/17; small wound on the left lateral foot some surrounding callus. Chronic damage in this area as a result of initial fracture nonhealing and secondary infection. 05/06/17; the patient arrives today with the area on the left lateral foot closed. There is thick subcutaneous tissue with a layer of callus. I took some of the callus off just to ensure adequate closure of the underlying tissue and there is no open wound here. He has considerable deformity of this area of his foot however and unless there  is an ability to offload this I think opening is going to be recurrent. He tells me he has diabetic foot wear although I have not actually seen this Readmission: 09/02/17 on evaluation today patient appears to be doing a little better compared to what he tells me what's going on in regard to his left lateral foot. He was previously seen in the ER this past Saturday it is now Tuesday. On Saturday he actually was treated with antibiotics including Cipro 500 mg two times a day and doxycycline 100 mg two times a day. Subsequently he also did have an x-ray performed of his left foot which revealed increased degenerative changes of the base of the fifth  metatarsal since comparison in 2018. There was no radiographic evidence of osteomyelitis however. Patient has previously had an MRI of the left foot which was performed on 04/03/17 in this revealed at that point a soft tissue ulceration but no evidence of osteomyelitis. Patient has previously undergone testing in regard to his ABI's by Dr. Bridgett Larsson and this showed that he did have ABI was within normal limits which does correspond with ABI's we checked here in the office today. That study was on 01/13/16. With all that being said on evaluation today there does not appear to be any evidence of a true open wound of the left lateral fifth metatarsal region. He does have tenderness noted as well is a little bit of boggy/fluctuance feeling to the area in this region of his foot and there is definitely a very solid and firm callous noted laterally. With that being said I am not able to find any obvious opening at this point there does appear to be a spot where there appears to been a opening very recently however patient states he has not noted any drainage from this currently. He does however have discomfort with palpation of this area this is in the 3-4/10 range only with very firm palpation this is not too significant at all. Subsequently he does have a small ulcer at  the medial nail bed of the right second toe where he inadvertently pulled off a portion of the toenail softly causing this injury. Otherwise there does not appear to be any evidence of infection in regard to this right second toe. 09/23/17 on evaluation today patient actually appears to be doing well he has no open ulcerations noted at this point. With that being said he did have significant callous noted over the left lateral foot which did require callous pairing today he tolerated this without complication. Readmission: 11/07/17 patient presents today for readmission concerning to ulcers that he has. One on the right plantar fifth metatarsal region and the other on the left plantar fifth metatarsal region. He has previously had an x-ray which was performed on 11/05/17 and revealed that the patient had wounds noted of the base of the fifth metatarsal region without evidence of destruction to the bone to suggest osteomyelitis. Obviously this is excellent news. With that being said he does have a significantly large ulcer that extensively plantar surface of the wound and is concerning for the fact that he continues to walk on this due to the patient telling me that he "has to work" I do believe this has likely led to both the opening of the wound as well as the fact that is not really able to heal very well at this point. He has been seen by his podiatrist and apparently according to the note dated 10/23/17 the patient was provided with Santyl and recommended a dry dressing. No wound culture has been obtained at this point. As stated above he has had returned to work due to financial reasons and is not able to wear his postop shoes due to the fact that he cannot wear this and work. The wound is stated to have been present for "several weeks" prior to the visit with the podiatrist on 10/23/17. With that being said again at least the good news is there does not appear to be any evidence of  osteomyelitis. 11/14/17 on evaluation today patient appears to be doing poorly in regard to his bilateral foot ulcers. Unfortunately he seems to have significant callous with a lot of  maceration underlined especially on the right even more so than the left. He has been tolerating the dressing changes without complication. With that being said overall I do think he's gonna have to have a significant debridement today. 11/21/17 on evaluation today patient presents for follow-up concerning his bilateral lateral foot ulcers. He has been tolerating the dressing changes without complication. His wife helps perform these for him. With that being said the patient states that the left lateral foot ulcer actually is continuing to give him trouble as far as discomfort is concerned. With that being said actually appears better. 11/28/17 on evaluation today patient appears to be doing poorly in regard to his left lower extremity ulcer in particular. His right as made some progress on the lateral portion although the plantar portion is actually getting somewhat worse I think this is likely due to the fact that he continues to work although it hurts and he's been having a lot of issues out of this side and as far as pain is concerned. With that being said my biggest concern on evaluation today is that of the patient having issues with increased pain in the left lower extremity ulcer site especially on the plantar aspect. He also appears to have what seems to be an abscess developing in the dorsal/lateral portion of his foot which I think likely is communicating somewhere internally with the ulcer site. Nonetheless he has not had any fevers, chills, nausea, or vomiting noted at this time. He has however had fairly significant increase in pain and he was Artie having a lot of pain previous. Readmission: 01-29-2022 upon evaluation today patient presents for initial inspection here in our clinic concerning the current issue  which has been present since around Oct 29, 2021. The patient tells me that he has at this point been seen by Dr. Vickki Muff who has been managing this up to this point. He has also been seen by Dr. Lazaro Arms I will outline the findings there shortly as well. With that being said currently according to Dr. Alvera Singh note on 12-18-2021 the patient has a prescription for Zosyn that was given by infectious disease. Again that was back in June however. There was a hospital encounter for incision and drainage and debridement of the foot in order to clearway necrotic tissue. The patient also underwent vascular evaluation with Dr. Lucky Cowboy. Nonetheless on the postop visit on 12-18-2021 the patient was seeming to be doing quite well based on what Dr. Vickki Muff said but nonetheless since that time is really not made much progress and is concerned about losing his foot. He therefore requested a referral to wound care to be seen and evaluated and see if there is anything we can do to help and aid with getting this to heal. Fortunately there does not appear to be any evidence of active infection locally or systemically at this time which is great news. He tells me that he is currently using Betadine wet-to-dry dressings. He does not note any systemic infections but does have evidence of local infection based on what we are seeing. He has had a recent chest x-ray which showed that he had no evidence of acute cardiopulmonary disease that was performed on 12-12-2021. He also underwent vascular intervention with Dr. 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 Subsequently the patient states  that Dr. Lucky Cowboy has mentioned that he would need to go back at some time with Dr. Lucky Cowboy said he would consider a posterior tibial approach for revascularization in the future but at this time because of  kidney status and the amount of dye used he did not want to proceed any further at that point. Again that something that would likely be scheduled down the road. Nonetheless right now the patient does have an ABI on the left of 0.90 with a TBI of 0.6-0 but this was decreased compared to previous. His ABI on the right was 1.08. Patient did have as well MRI of the left foot which was performed on 12-12-2021 and this shows that he does have interval resection of the proximal third of the fifth metatarsal and area of osteomyelitis on prior MRI. There is persistent ulceration of plantar lateral aspect at the resection site with a sinus tract JEFFERSON, FULLAM ([id number]) [id number] Page 11 of 13 extending towards the adjacent cuboid bone findings suspicious for osteomyelitis of the plantar lateral aspect of the cuboid and adjacent base of the fourth metatarsal possibly in the proximal most aspect of the remaining fifth metatarsal shaft as well. The ABI performed on 12-13-2021 showed that the left ABI was 0.83 which had previously been registered at 1.35 and the right ABI was 1.20. I think this is consistent with being able to heal but obviously would do better with vascular intervention not something that is going to be looked into in the future. This was noted above as well. Based on what I am seeing today I do believe that the patient is definitely a candidate for HBO therapy I think this may be in his best interest as I think he is at the point of being in a limb threatening situation. I do believe that he would benefit from hyperbarics along with continued antibiotics along with good wound care measures as well. 02-04-2022 upon evaluation today patient appears to be doing still poorly in regard to his foot he is having a lot of drainage and he notes odor as well. With that being said we still have not heard anything back from insurance yet as far as approval for HBO therapy.  Subsequently the patient does have evidence of osteomyelitis and I think hyperbarics is definitely going to be something that would be beneficial for him. He has been on antibiotics several times previous but unfortunately this just does not seem to be sustained enough we did do a culture it showed Pseudomonas as one of the primary organisms both organisms would be treated with Levaquin which I think is going to be the best way to go. I discussed that with the patient today and I [name] send that into the pharmacy. 02-11-2022 upon evaluation today patient appears to be doing well currently in regard to his wound. I do feel like the Levaquin is doing better for him which is great news. Fortunately I do not see any evidence of active infection locally or systemically at this time which is great news. No fevers, chills, nausea, vomiting, or diarrhea. 02-19-2022 upon evaluation today patient appears to be doing excellent in regard to his foot ulcer all things considered he still has bone exposed but the good news is we are at least making some progress here I think he does have a lot of odor he tells me and I think switching over to Dakin's moistened gauze dressing is probably can be the way to go. 02-26-2022 upon evaluation  today patient appears to be doing well currently in regard to his wound all things considered. Although the 1 thing I do see is that he is not changing the dressing as frequently as he should be. Fortunately there does not appear to be any signs of active infection he tells me his wife is out of town from Saturday through today so it has not been changed since Saturday before that they have been doing this every other day. I explained that with the Dakin's he needs to do this every day in order to make sure that it heals appropriately. This will also help with the odor which she has been complaining of. 03-11-2022 upon evaluation today patient appears to be doing better overall visually in  regard to his wound. He is tolerating the dressing changes without complication. Fortunately there does not appear to be any evidence of active infection locally or systemically at this time. No fevers, chills, nausea, vomiting, or diarrhea. 03-19-2022 upon evaluation today patient appears to be doing decently well in regard to his wound. He is actually tolerating dressing changes without Complication. Fortunately there does not appear to be any signs of active infection locally or systemically at this time which is great news. No fevers, chills, nausea, vomiting, or diarrhea. 04-02-2022 upon evaluation today patient's wound is actually showing signs of significant improvement I am actually very pleased with where we stand today. I do not see any evidence of infection locally or systemically at this time which is great news. 04-08-2022 upon evaluation today patient actually appears to be making good progress. I am very pleased with where things stand his wound is smaller he seems to be tolerating hyperbarics well he is doing well with wound care and in general I think we are making excellent headway here. I do believe that he would likely benefit from extending the hyperbarics. I think that he is making such good progress I do not want to see anything backtrack on him at this point. Overall the benefit coupled with the antibiotics and an excellent wound care that we have been working with him with and that his wife is continuing to do at home I think he is making a great impact in getting this healed to prevent limb loss. 04-16-2022 upon evaluation today patient appears to be doing excellent with the [facility] dressing. Coupled with hyperbarics he is managing quite nicely and healing quite nicely. Fortunately I do not see any evidence of active infection locally or systemically which is great news and overall I am extremely pleased with where we stand today. No fevers, chills, nausea, vomiting, or  diarrhea. 04-23-2022 upon evaluation today patient appears to be doing well currently in regard to his wound for the most part although there is some area of rubbing on the lateral aspect as well as the top of his foot. When questioned he actually has been more active over the weekend he tells me that it was his birthday and he had some friends over. Subsequently I think this is what led to things being somewhat worse compared to what they were previous. Fortunately I do not see any evidence of active infection locally or systemically at this time which is great news. No fevers, chills, nausea, vomiting, or diarrhea. We are still waiting on the approval for continuation of HBO therapy. 04-30-2022 upon evaluation today patient's wound is actually showing signs of improvement. He continues to utilize the knee scooter which I think is doing well for him.  The volume continues to improve the size got a little worse last week due to the fact that he was up on this more. The good news is he is actually back down to more normal situation at this point today. There is some need for sharp debridement. Objective Constitutional Well-nourished and well-hydrated in no acute distress. Vitals Time Taken: 8:09 AM, Height: 70 in, Weight: 255 lbs, BMI: 36.6, Temperature: 97.8 F, Pulse: 103 bpm, Respiratory Rate: 18 breaths/min, Blood Pressure: 157/80 mmHg. Respiratory normal breathing without difficulty. Psychiatric this patient is able to make decisions and demonstrates good insight into disease process. Alert and Oriented x 3. pleasant and cooperative. General Notes: Upon inspection patient's wound bed actually showed signs of good granulation and epithelization at this point. Fortunately I do not see any evidence of infection locally or systemically which is great news. No fevers, chills, nausea, vomiting, or diarrhea. I did perform sharp debridement clearway some of the necrotic debris today. This included  callus around the edges of the wound and slough and biofilm on the surface of the wound down to good subcutaneous tissue. He tolerated this without complication postdebridement the wound bed appears to be doing much better pretty much across the board. Integumentary (Hair, Skin) Wound #14 status is Open. Original cause of wound was Blister. The date acquired was: 10/29/2021. The wound has been in treatment 13 weeks. The wound is located on the Left,Lateral Foot. The wound measures 4.5cm length x 2.5cm width x 0.3cm depth; 8.836cm^2 area and 2.651cm^3 volume. There is bone, LEYTON, MAGOON ([id number]) [id number] Page 12 of 13 muscle, and Fat Layer (Subcutaneous Tissue) exposed. There is no tunneling or undermining noted. There is a medium amount of serosanguineous drainage noted. The wound margin is epibole. There is large (67-100%) red granulation within the wound bed. There is a small (1-33%) amount of necrotic tissue within the wound bed including Adherent Slough. 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 There was a Excisional Skin/Subcutaneous Tissue Debridement with a total area of 11.25 sq cm performed by [name] Sams., [name]. With the following instrument(s): Curette to remove Viable and Non-Viable tissue/material. Material removed includes Subcutaneous Tissue, Slough, Skin: Dermis, and Skin: Epidermis. No specimens were taken. A time out was conducted at 08:30, prior to the start of the procedure. A Moderate  amount of bleeding was controlled with Pressure. The procedure was tolerated well with a pain level of 0 throughout and a pain level of 0 following the procedure. Post Debridement Measurements: 4.5cm length x 2.5cm width x 0.3cm depth; 2.651cm^3 volume. Character of Wound/Ulcer Post Debridement is improved. Severity of Tissue Post Debridement is: Fat layer exposed. Post procedure Diagnosis Wound #14: Same as Pre-Procedure Plan Follow-up Appointments: Return Appointment in 1 week. Bathing/ Shower/ Hygiene: May shower with wound dressing protected with water repellent cover or cast protector. Anesthetic (Use 'Patient Medications' Section for Anesthetic Order Entry): Lidocaine applied to wound bed Edema Control - Lymphedema / Segmental Compressive Device / Other: Elevate, Exercise Daily and Avoid Standing for Long Periods of Time. Elevate legs to the level of the heart and pump ankles as often  as possible Elevate leg(s) parallel to the floor when sitting. Off-Loading: Open toe surgical shoe Other: - knee scooter Additional Orders / Instructions: Other: - need daily dressing changes Hyperbaric Oxygen Therapy: Wound #14 Left,Lateral Foot: Indication and location: - Left foot osteomyelitis If appropriate for treatment, begin HBOT per protocol: 2.0 ATA for 90 Minutes without Air Breaks One treatment per day (delivered Monday through Friday unless otherwise specified in Special Instructions below): T # of Treatments: - 40 additional 40 requested for a total of 80 otal Antihistamine 30 minutes prior to HBO Treatment, difficulty clearing ears. Finger stick Blood Glucose Pre- and Post- HBOT Treatment. Follow Hyperbaric Oxygen Glycemia Protocol WOUND #14: - Foot Wound Laterality: Left, Lateral Cleanser: [place] Ancillary Kit - 15 Day Supply (Generic) 3 x Per Week/30 Days Discharge Instructions: Use supplies as instructed; Kit contains: (15) Saline Bullets; (15) 3x3 Gauze; 15 pr Gloves Cleanser:  Wound Cleanser 3 x Per Week/30 Days Discharge Instructions: Wash your hands with soap and water. Remove old dressing, discard into plastic bag and place into trash. Cleanse the wound with Wound Cleanser prior to applying a clean dressing using gauze sponges, not tissues or cotton balls. Do not scrub or use excessive force. Pat dry using gauze sponges, not tissue or cotton balls. Prim Dressing: Hydrofera Blue Ready Transfer Foam, 4x5 (in/in) (Dispense As Written) 3 x Per Week/30 Days ary Discharge Instructions: Apply Hydrofera Blue Ready to wound bed as directed Secondary Dressing: ABD Pad 5x9 (in/in) (Generic) 3 x Per Week/30 Days Discharge Instructions: Cover with ABD pad Secured With: Medipore T - 61M Medipore H Soft Cloth Surgical T ape ape, 2x2 (in/yd) (Generic) 3 x Per Week/30 Days Secured With: [employer] Sterile or Non-Sterile 6-ply 4.5x4 (yd/yd) (Generic) 3 x Per Week/30 Days Discharge Instructions: Apply Kerlix as directed 1. I am good recommend that we have the patient continue to monitor for any signs of worsening or infection. Obviously if anything changes he knows he can contact the office and let me know. 2. Also can recommend that we continue specifically with the [facility] which I think is doing an awesome job followed by ABD pad to cover. 3. I am also can recommend that he continue with the roll gauze to secure in place and again he is using the knee scooter for offloading. I encouraged him that HARJAS, BIGGINS ([id number]) [id number] Page 13 of 13 he needs to make sure to use this whenever he is up and about otherwise he is going to have a setback. We will see patient back for reevaluation in 1 week here in the clinic. If anything worsens or changes patient will contact our office for additional recommendations. Electronic Signature(s) Signed: 04/30/2022 4:03:28 PM By: [name] [name] Entered By: [name] on 04/30/2022  16:03:28 -------------------------------------------------------------------------------- SuperBill Details Patient Name: Date of Service: [name] RO LD F. 04/30/2022 Medical Record Number: [id number] Patient Account Number: [id number] Date of Birth/Sex: Treating RN: 1961/03/21 (61 y.o. [name]) [name] Primary Care Provider: Royetta [name] Other Clinician: Referring Provider: Treating Provider/Extender: [name] in Treatment: 13 Diagnosis Coding ICD-10 Codes Code Description [phone] Chronic multifocal osteomyelitis, left ankle and foot E11.621 Type 2 diabetes mellitus with foot ulcer L97.524 Non-pressure chronic ulcer of other part of left foot with necrosis of bone T81.31XA Disruption of external operation (surgical) wound, not elsewhere classified, initial encounter Facility Procedures : CPT4 Code: [id number] Description: [place] TISSUE 20 SQ CM/< ICD-10 Diagnosis Description L97.524 Non-pressure  chronic ulcer of other part of left foot with necrosis of bone Modifier: Quantity: 1 Physician Procedures : CPT4 Code Description Modifier [id number] 11042 - WC PHYS SUBQ TISS 20 SQ CM ICD-10 Diagnosis Description L97.524 Non-pressure chronic ulcer of other part of left foot with necrosis of bone Quantity: 1 Electronic Signature(s) Signed: 04/30/2022 4:03:43 PM By: [name] [name] Previous Signature: 04/30/2022 3:31:49 PM Version By: [name] Coria RN Entered By: [name] on 04/30/2022 16:03:43

## 2022-05-01 ENCOUNTER — Encounter: Payer: 59 | Attending: Internal Medicine | Admitting: Internal Medicine

## 2022-05-01 DIAGNOSIS — I10 Essential (primary) hypertension: Secondary | ICD-10-CM | POA: Diagnosis not present

## 2022-05-01 DIAGNOSIS — T8131XA Disruption of external operation (surgical) wound, not elsewhere classified, initial encounter: Secondary | ICD-10-CM | POA: Diagnosis not present

## 2022-05-01 DIAGNOSIS — E11621 Type 2 diabetes mellitus with foot ulcer: Secondary | ICD-10-CM | POA: Diagnosis not present

## 2022-05-01 DIAGNOSIS — M86372 Chronic multifocal osteomyelitis, left ankle and foot: Secondary | ICD-10-CM | POA: Diagnosis not present

## 2022-05-01 DIAGNOSIS — L97524 Non-pressure chronic ulcer of other part of left foot with necrosis of bone: Secondary | ICD-10-CM | POA: Insufficient documentation

## 2022-05-01 DIAGNOSIS — E1142 Type 2 diabetes mellitus with diabetic polyneuropathy: Secondary | ICD-10-CM | POA: Insufficient documentation

## 2022-05-01 DIAGNOSIS — X58XXXA Exposure to other specified factors, initial encounter: Secondary | ICD-10-CM | POA: Diagnosis not present

## 2022-05-01 LAB — GLUCOSE, CAPILLARY
Glucose-Capillary: 200 mg/dL — ABNORMAL HIGH (ref 70–99)
Glucose-Capillary: 219 mg/dL — ABNORMAL HIGH (ref 70–99)

## 2022-05-01 NOTE — Progress Notes (Signed)
DEMONTREZ, RINDFLEISCH ([id number]) [id number] Page 1 of 2 Visit Report for 05/01/2022 HBO Details Patient Name: Date of Service: [name] RO LD F. 05/01/2022 8:00 A M Medical Record Number: [id number] Patient Account Number: [id number] Date of Birth/Sex: Treating RN: [birth year] (61 y.o. 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 HBO Treatment Details Treatment Number: 43 Patient Type: Outpatient Chamber Type: Monoplace Chamber Serial #: [id number] Treatment Protocol: 2.0 ATA with 90 minutes oxygen, and no air breaks Treatment Details Compression Rate Down: 1.5 psi / minute De-Compression Rate Up: Air breaks and breathing Decompress Decompress Compress Tx Pressure Begins Reached periods Begins Ends (leave unused spaces blank) Chamber Pressure (ATA 1 2 ------2 1 ) Clock Time (24 hr) 08:21 08:32 - - - - - - 10:02 10:12 Treatment Length: 111 (minutes) Treatment Segments: 4 Vital Signs Capillary Blood Glucose Reference Range: 80 - 120 mg / dl HBO Diabetic Blood Glucose Intervention Range: <131 mg/dl or >249 mg/dl Type: Time Vitals Blood Respiratory Capillary Blood Glucose Pulse Action Pulse: Temperature: Taken: Pressure: Rate: Glucose (mg/dl): Meter #: Oximetry (%) Taken: Pre 08:12 112/64 84 16 97.7 200 1 none per protocol Post 10:19 126/74 78 16 97.8 219 1 none per protocol Treatment Response Treatment Toleration: Well Treatment Completion Status: Treatment Completed without Adverse Event Kiley Torrence Notes No concerns with treatment given HBO Attestation I certify that I supervised  this HBO treatment in accordance with Medicare guidelines. A trained emergency response team is readily available per Yes hospital policies and procedures. Continue HBOT as ordered. [address] [name], [name] ([id number]) [id number] Page 2 of 2 Signed: 05/01/2022 3:59:15 PM By: [name] MD Previous Signature: 05/01/2022 2:02:40 PM Version By: [name] RCP, RRT CHT , , Entered By: [name] on 05/01/2022 15:57:52 -------------------------------------------------------------------------------- HBO Safety Checklist Details Patient Name: Date of Service: [name] RO LD F. 05/01/2022 8:00 A M Medical Record Number: [id number] Patient Account Number: [id number] Date of Birth/Sex: Treating RN: Jul 12, 1960 (61 y.o. [name], [name] Primary Care Aristides Luckey: [name] Other Clinician: [name] Referring Kandice Schmelter: Treating Reeva Davern/Extender: [name], [name] [name], [name] in Treatment: 13 HBO Safety Checklist Items Safety Checklist Consent Form Signed Patient voided / foley secured and emptied When did you last eato 07:00 am Last dose of injectable or oral agent 04/30/22 pm Ostomy pouch emptied and vented if applicable NA All implantable devices assessed, documented and approved NA Intravenous access site secured and place NA Valuables secured Linens and cotton and cotton/polyester blend (less than 51% polyester) Personal oil-based products / skin lotions / body lotions removed Wigs or hairpieces removed NA Smoking or tobacco materials removed NA Books / newspapers / magazines / loose paper removed NA Cologne, aftershave, perfume and deodorant removed Jewelry removed (may wrap wedding band) Make-up removed NA Hair care products removed Battery operated devices (external) removed NA Heating patches and chemical warmers removed NA Titanium eyewear removed NA Nail polish cured greater than 10  hours NA Casting material cured greater than 10 hours NA Hearing aids removed NA Loose dentures or partials removed NA Prosthetics have been removed NA Patient demonstrates correct use of air break device (if applicable) Patient concerns have  been addressed Patient grounding bracelet on and cord attached to chamber Specifics for Inpatients (complete in addition to above) Medication sheet sent with patient Intravenous medications needed or due during therapy sent with patient Drainage tubes (e.g. nasogastric tube or chest tube secured and vented) Endotracheal or Tracheotomy tube secured Cuff deflated of air and inflated with saline Airway suctioned Electronic Signature(s) Signed: 05/01/2022 2:02:40 PM By: [name] RCP, RRT CHT , , Entered By: [name] on 05/01/2022 08:49:30 ,

## 2022-05-01 NOTE — Progress Notes (Signed)
[name] ([id number]) [id number] Page 1 of 2 Visit Report for 05/01/2022 Arrival Information Details Patient Name: Date of Service: [name] RO LD F. 05/01/2022 8:00 A M Medical Record Number: [id number] Patient Account Number: [id number] Date of Birth/Sex: Treating RN: [birth year] (61 y.o. [name] Primary Care Mikeyla Music: [name] Other Clinician: [name] Referring Endya Austin: Treating Blayde Bacigalupi/Extender: [name], [name] in Treatment: 58 Visit Information History Since Last Visit Added or deleted any medications: No Patient Arrived: Knee Scooter Any new allergies or adverse reactions: No Arrival Time: 08:00 Had a fall or experienced change in No Accompanied By: self activities of daily living that may affect Transfer Assistance: None risk of falls: Patient Identification Verified: Yes Signs or symptoms of abuse/neglect since No Secondary Verification Process Completed: Yes last visito Patient Requires Transmission-Based Precautions: No Hospitalized since last visit: No Patient Has Alerts: No Implantable device outside of the clinic No excluding cellular tissue based products placed in the center since last visit: Has Dressing in Place as Prescribed: Yes Has Footwear/Offloading in Place as Yes Prescribed: Left: Surgical Shoe with Pressure Relief Insole Pain Present Now: No Electronic Signature(s) Signed: 05/01/2022 2:02:40 PM By: [name] RCP, RRT CHT , , Entered By: [name] on 05/01/2022 08:45:56 , -------------------------------------------------------------------------------- Encounter Discharge Information Details Patient Name: Date of Service: [name] RO LD F. 05/01/2022 8:00 A M Medical Record Number: [id number] Patient Account Number: [id number] Date of Birth/Sex: Treating RN: February 01, 1961 (61 y.o. [name] Primary Care [name]: [name] Other  Clinician: [name] Referring [name]: Treating [name]/Extender: [name], [name] in Treatment: 13 Encounter Discharge Information Items Discharge Condition: Stable Ambulatory Status: Knee Scooter Discharge Destination: Home [name] ([id number]) [id number] Page 2 of 2 Transportation: Private Auto Accompanied By: self Schedule Follow-up Appointment: Yes Clinical Summary of Care: Notes Patient has an HBO treatment scheduled on 05/02/22 at 08:00 am. Electronic Signature(s) Signed: 05/01/2022 2:02:40 PM By: [name] RCP, RRT CHT , , Entered By: [name] on 05/01/2022 14:00:29 , -------------------------------------------------------------------------------- Vitals Details Patient Name: Date of Service: [name] RO LD F. 05/01/2022 8:00 A M Medical Record Number: [id number] Patient Account Number: [id number] Date of Birth/Sex: Treating RN: 02/21/1961 (61 y.o. [name] Mercury Primary Care Kella Splinter: [name] Other Clinician: [name] Referring Akacia Boltz: Treating Laina Guerrieri/Extender: [name], [name] in Treatment: 13 Vital Signs Time Taken: 08:12 Temperature (F): 97.7 Height (in): 70 Pulse (bpm): 84 Weight (lbs): 255 Respiratory Rate (breaths/min): 16 Body Mass Index (BMI): 36.6 Blood Pressure (mmHg): 112/64 Capillary Blood Glucose (mg/dl): 200 Reference Range: 80 - 120 mg / dl Electronic Signature(s) Signed: 05/01/2022 2:02:40 PM By: [name] RCP, RRT CHT , , Entered By: [name] on 05/01/2022 08:48:13 ,

## 2022-05-02 ENCOUNTER — Encounter: Payer: 59 | Admitting: Physician Assistant

## 2022-05-02 DIAGNOSIS — M86372 Chronic multifocal osteomyelitis, left ankle and foot: Secondary | ICD-10-CM | POA: Diagnosis not present

## 2022-05-02 LAB — GLUCOSE, CAPILLARY
Glucose-Capillary: 165 mg/dL — ABNORMAL HIGH (ref 70–99)
Glucose-Capillary: 173 mg/dL — ABNORMAL HIGH (ref 70–99)

## 2022-05-02 NOTE — Progress Notes (Signed)
[name], [name] ([id number]) [id number] Page 1 of 2 Visit Report for 05/02/2022 Arrival Information Details Patient Name: Date of Service: [name] RO LD F. 05/02/2022 8:00 A M Medical Record Number: [id number] Patient Account Number: [id number] Date of Birth/Sex: Treating RN: 05-22-1961 (61 y.o. [name], [name] Primary Care Mozella Rexrode: [name] Other Clinician: [name] Referring Divonte Senger: Treating Samwise Eckardt/Extender: [name] in Treatment: 49 Visit Information History Since Last Visit Added or deleted any medications: No Patient Arrived: Knee Scooter Any new allergies or adverse reactions: No Arrival Time: 08:00 Had a fall or experienced change in No Accompanied By: self activities of daily living that may affect Transfer Assistance: None risk of falls: Patient Identification Verified: Yes Signs or symptoms of abuse/neglect since No Secondary Verification Process Completed: Yes last visito Patient Requires Transmission-Based Precautions: No Hospitalized since last visit: No Patient Has Alerts: No Implantable device outside of the clinic No excluding cellular tissue based products placed in the center since last visit: Has Dressing in Place as Prescribed: Yes Has Footwear/Offloading in Place as Yes Prescribed: Left: Surgical Shoe with Pressure Relief Insole Pain Present Now: No Electronic Signature(s) Signed: 05/02/2022 12:21:24 PM By: [name] RCP, RRT CHT , , Entered By: [name] on 05/02/2022 09:25:53 , -------------------------------------------------------------------------------- Encounter Discharge Information Details Patient Name: Date of Service: [name] RO LD F. 05/02/2022 8:00 A M Medical Record Number: [id number] Patient Account Number: [id number] Date of Birth/Sex: Treating RN: 11-04-1960 (61 y.o. [name], [name] Primary Care Vernon Maish: [name] Other Clinician:  [name] Referring Rhiannon Sassaman: Treating Giah Fickett/Extender: [name] in Treatment: 13 Encounter Discharge Information Items Discharge Condition: Stable Ambulatory Status: Knee Scooter Discharge Destination: Home [name], [name] ([id number]) [id number] Page 2 of 2 Transportation: Private Auto Accompanied By: self Schedule Follow-up Appointment: Yes Clinical Summary of Care: Notes Patient has an HBO treatment scheduled on 05/03/22 at 08:00 am. Electronic Signature(s) Signed: 05/02/2022 12:21:24 PM By: [name] RCP, RRT CHT , , Entered By: [name] on 05/02/2022 12:21:05 , -------------------------------------------------------------------------------- Vitals Details Patient Name: Date of Service: [name] RO LD F. 05/02/2022 8:00 A M Medical Record Number: [id number] Patient Account Number: [id number] Date of Birth/Sex: Treating RN: 02-Sep-1960 (61 y.o. [name], [name] Primary Care Tiondra Fang: [name] Other Clinician: [name] Referring Tallie Hevia: Treating Skylor Schnapp/Extender: [name] in Treatment: 13 Vital Signs Time Taken: 08:06 Temperature (F): 97.8 Height (in): 70 Pulse (bpm): 72 Weight (lbs): 255 Respiratory Rate (breaths/min): 16 Body Mass Index (BMI): 36.6 Blood Pressure (mmHg): 112/64 Capillary Blood Glucose (mg/dl): 165 Reference Range: 80 - 120 mg / dl Electronic Signature(s) Signed: 05/02/2022 12:21:24 PM By: [name] RCP, RRT CHT , , Entered By: [name] on 05/02/2022 09:26:34 ,

## 2022-05-03 ENCOUNTER — Encounter: Payer: 59 | Admitting: Physician Assistant

## 2022-05-03 DIAGNOSIS — M86372 Chronic multifocal osteomyelitis, left ankle and foot: Secondary | ICD-10-CM | POA: Diagnosis not present

## 2022-05-03 LAB — GLUCOSE, CAPILLARY
Glucose-Capillary: 231 mg/dL — ABNORMAL HIGH (ref 70–99)
Glucose-Capillary: 161 mg/dL — ABNORMAL HIGH (ref 70–99)

## 2022-05-03 NOTE — Progress Notes (Signed)
[name], [name] ([id number]) [id number] Page 1 of 2 Visit Report for 05/03/2022 HBO Details Patient Name: Date of Service: [name] RO LD F. 05/03/2022 8:00 A M Medical Record Number: [id number] Patient Account Number: [id number] Date of Birth/Sex: Treating RN: [birth year] (61 y.o. [name], [name] Primary Care Uriah Trueba: [name] Other Clinician: [name] Referring Joelyn Lover: Treating Lorali Khamis/Extender: [name] in Treatment: 13 HBO Treatment Course Details Treatment Course Number: 1 Ordering Davonne Jarnigan: [name] T Treatments Ordered: otal 60 HBO Treatment Start Date: 02/18/2022 HBO Indication: Chronic Refractory Osteomyelitis to Left Ankle and Foot HBO Treatment Details Treatment Number: 45 Patient Type: Outpatient Chamber Type: Monoplace Chamber Serial #: [id number] Treatment Protocol: 2.0 ATA with 90 minutes oxygen, and no air breaks Treatment Details Compression Rate Down: 1.5 psi / minute De-Compression Rate Up: 2.0 psi / minute Air breaks and breathing Decompress Decompress Compress Tx Pressure Begins Reached periods Begins Ends (leave unused spaces blank) Chamber Pressure (ATA 1 2 ------2 1 ) Clock Time (24 hr) 08:20 08:30 - - - - - - 10:00 10:10 Treatment Length: 110 (minutes) Treatment Segments: 4 Vital Signs Capillary Blood Glucose Reference Range: 80 - 120 mg / dl HBO Diabetic Blood Glucose Intervention Range: <131 mg/dl or >249 mg/dl Type: Time Vitals Blood Respiratory Capillary Blood Glucose Pulse Action Pulse: Temperature: Taken: Pressure: Rate: Glucose (mg/dl): Meter #: Oximetry (%) Taken: Pre 08:10 122/66 72 16 97.9 231 1 none per protocol Post 10:20 128/68 72 16 97.8 161 1 none per protocol Treatment Response Treatment Toleration: Well Treatment Completion Status: Treatment Completed without Adverse Event Electronic Signature(s) Signed: 05/03/2022 11:35:51 AM By: [name] RCP,  RRT CHT , , Signed: 05/03/2022 1:54:42 PM By: Worthy Keeler PA-C Entered By: [name] on 05/03/2022 11:33:34 , [name] ([id number]) [id number] Page 2 of 2 -------------------------------------------------------------------------------- HBO Safety Checklist Details Patient Name: Date of Service: [name] RO LD F. 05/03/2022 8:00 A M Medical Record Number: [id number] Patient Account Number: [id number] Date of Birth/Sex: Treating RN: [birth year] (61 y.o. [name], [name] Primary Care Eastyn Dattilo: [name] Other Clinician: [name] Referring Rivers Hamrick: Treating Trinton Prewitt/Extender: [name] in Treatment: 13 HBO Safety Checklist Items Safety Checklist Consent Form Signed Patient voided / foley secured and emptied When did you last eato 07:00 am Last dose of injectable or oral agent 05/02/22 pm Ostomy pouch emptied and vented if applicable NA All implantable devices assessed, documented and approved NA Intravenous access site secured and place NA Valuables secured Linens and cotton and cotton/polyester blend (less than 51% polyester) Personal oil-based products / skin lotions / body lotions removed Wigs or hairpieces removed NA Smoking or tobacco materials removed NA Books / newspapers / magazines / loose paper removed NA Cologne, aftershave, perfume and deodorant removed Jewelry removed (may wrap wedding band) Make-up removed NA Hair care products removed Battery operated devices (external) removed NA Heating patches and chemical warmers removed NA Titanium eyewear removed NA Nail polish cured greater than 10 hours NA Casting material cured greater than 10 hours NA Hearing aids removed NA Loose dentures or partials removed NA Prosthetics have been removed NA Patient demonstrates correct use of air break device (if applicable) Patient concerns have been addressed Patient grounding bracelet  on and cord attached to chamber Specifics for Inpatients (complete in addition to above) Medication sheet sent with patient Intravenous medications needed or due during therapy sent with patient Drainage tubes (e.g. nasogastric tube or chest tube secured and  vented) Endotracheal or Tracheotomy tube secured Cuff deflated of air and inflated with saline Airway suctioned Electronic Signature(s) Signed: 05/03/2022 11:35:51 AM By: [name] RCP, RRT CHT , , Entered By: [name] on 05/03/2022 08:43:50 ,

## 2022-05-03 NOTE — Progress Notes (Signed)
WLLIAM, [name] ([id number]) [id number] Page 1 of 2 Visit Report for 05/03/2022 Arrival Information Details Patient Name: Date of Service: [name] RO LD F. 05/03/2022 8:00 A M Medical Record Number: [id number] Patient Account Number: [id number] Date of Birth/Sex: Treating RN: [birth year] (61 y.o. [name], [name] Primary Care Jacen Carlini: [name] Other Clinician: [name] Referring Quinlyn Tep: Treating Ziyana Morikawa/Extender: [name] in Treatment: 70 Visit Information History Since Last Visit Added or deleted any medications: No Patient Arrived: Knee Scooter Any new allergies or adverse reactions: No Arrival Time: 08:00 Had a fall or experienced change in No Accompanied By: self activities of daily living that may affect Transfer Assistance: None risk of falls: Patient Identification Verified: Yes Signs or symptoms of abuse/neglect since No Secondary Verification Process Completed: Yes last visito Patient Requires Transmission-Based Precautions: No Hospitalized since last visit: No Patient Has Alerts: No Implantable device outside of the clinic No excluding cellular tissue based products placed in the center since last visit: Has Dressing in Place as Prescribed: Yes Has Footwear/Offloading in Place as Yes Prescribed: Left: Surgical Shoe with Pressure Relief Insole Pain Present Now: No Electronic Signature(s) Signed: 05/03/2022 11:35:51 AM By: [name] RCP, RRT CHT , , Entered By: [name] on 05/03/2022 08:40:57 , -------------------------------------------------------------------------------- Encounter Discharge Information Details Patient Name: Date of Service: [name] RO LD F. 05/03/2022 8:00 A M Medical Record Number: [id number] Patient Account Number: [id number] Date of Birth/Sex: Treating RN: 06/24/1961 (61 y.o. [name], [name] Primary Care Jair Lindblad: [name] Other Clinician:  [name] Referring Kveon Casanas: Treating Aedan Geimer/Extender: [name] in Treatment: 13 Encounter Discharge Information Items Discharge Condition: Stable Ambulatory Status: Knee Scooter Discharge Destination: Home GANNON, HEINZMAN ([id number]) [id number] Page 2 of 2 Transportation: Private Auto Accompanied By: self Schedule Follow-up Appointment: Yes Clinical Summary of Care: Notes Patient has an HBO treatment scheduled on 05/06/22 at 08:00 am. Electronic Signature(s) Signed: 05/03/2022 11:35:51 AM By: [name] RCP, RRT CHT , , Entered By: [name] on 05/03/2022 11:34:56 , -------------------------------------------------------------------------------- Vitals Details Patient Name: Date of Service: [name] RO LD F. 05/03/2022 8:00 A M Medical Record Number: [id number] Patient Account Number: [id number] Date of Birth/Sex: Treating RN: 08/14/1960 (61 y.o. [name], [name] Primary Care Yeng Perz: [name] Other Clinician: [name] Referring Marquise Lambson: Treating Katira Dumais/Extender: [name] in Treatment: 13 Vital Signs Time Taken: 08:10 Temperature (F): 97.9 Height (in): 70 Pulse (bpm): 72 Weight (lbs): 255 Respiratory Rate (breaths/min): 16 Body Mass Index (BMI): 36.6 Blood Pressure (mmHg): 122/66 Capillary Blood Glucose (mg/dl): 231 Reference Range: 80 - 120 mg / dl Electronic Signature(s) Signed: 05/03/2022 11:35:51 AM By: [name] RCP, RRT CHT , , Entered By: [name] on 05/03/2022 08:42:48 ,

## 2022-05-03 NOTE — Progress Notes (Signed)
[name] ([id number]) [id number] Page 1 of 2 Visit Report for 05/02/2022 HBO Details Patient Name: Date of Service: [name] RO LD F. 05/02/2022 8:00 A M Medical Record Number: [id number] Patient Account Number: [id number] Date of Birth/Sex: Treating RN: 1960/09/20 (61 y.o. [name], [name] Primary Care Maris Bena: [name] Other Clinician: [name] Referring Mayari Matus: Treating Dacoda Finlay/Extender: [name] in Treatment: 13 HBO Treatment Course Details Treatment Course Number: 1 Ordering Icis Budreau: [name] T Treatments Ordered: otal 60 HBO Treatment Start Date: 02/18/2022 HBO Indication: Chronic Refractory Osteomyelitis to Left Ankle and Foot HBO Treatment Details Treatment Number: 44 Patient Type: Outpatient Chamber Type: Monoplace Chamber Serial #: [id number] Treatment Protocol: 2.0 ATA with 90 minutes oxygen, and no air breaks Treatment Details Compression Rate Down: 1.5 psi / minute De-Compression Rate Up: 2.0 psi / minute Air breaks and breathing Decompress Decompress Compress Tx Pressure Begins Reached periods Begins Ends (leave unused spaces blank) Chamber Pressure (ATA 1 2 ------2 1 ) Clock Time (24 hr) 08:30 08:40 - - - - - - 10:10 10:19 Treatment Length: 109 (minutes) Treatment Segments: 4 Vital Signs Capillary Blood Glucose Reference Range: 80 - 120 mg / dl HBO Diabetic Blood Glucose Intervention Range: <131 mg/dl or >249 mg/dl Type: Time Vitals Blood Respiratory Capillary Blood Glucose Pulse Action Pulse: Temperature: Taken: Pressure: Rate: Glucose (mg/dl): Meter #: Oximetry (%) Taken: Pre 08:06 112/64 72 16 97.8 165 1 none per protocol Post 10:32 122/68 72 16 97.9 173 1 none per protocol Treatment Response Treatment Toleration: Well Treatment Completion Status: Treatment Completed without Adverse Event Electronic Signature(s) Signed: 05/02/2022 12:21:24 PM By: [name] RCP,  RRT CHT , , Signed: 05/02/2022 5:42:41 PM By: Worthy Keeler PA-C Entered By: [name] on 05/02/2022 12:20:17 , [name] ([id number]) [id number] Page 2 of 2 -------------------------------------------------------------------------------- HBO Safety Checklist Details Patient Name: Date of Service: [name] RO LD F. 05/02/2022 8:00 A M Medical Record Number: [id number] Patient Account Number: [id number] Date of Birth/Sex: Treating RN: [birth year] (61 y.o. [name], [name] Primary Care [name]: [name] Other Clinician: [name] Referring [name]: Treating [name]/Extender: [name] in Treatment: 13 HBO Safety Checklist Items Safety Checklist Consent Form Signed Patient voided / foley secured and emptied When did you last eato 007:00 am Last dose of injectable or oral agent 05/01/22 pm Ostomy pouch emptied and vented if applicable NA All implantable devices assessed, documented and approved NA Intravenous access site secured and place NA Valuables secured Linens and cotton and cotton/polyester blend (less than 51% polyester) Personal oil-based products / skin lotions / body lotions removed Wigs or hairpieces removed NA Smoking or tobacco materials removed NA Books / newspapers / magazines / loose paper removed NA Cologne, aftershave, perfume and deodorant removed Jewelry removed (may wrap wedding band) Make-up removed NA Hair care products removed Battery operated devices (external) removed NA Heating patches and chemical warmers removed NA Titanium eyewear removed NA Nail polish cured greater than 10 hours NA Casting material cured greater than 10 hours NA Hearing aids removed NA Loose dentures or partials removed NA Prosthetics have been removed NA Patient demonstrates correct use of air break device (if applicable) Patient concerns have been addressed Patient grounding  bracelet on and cord attached to chamber Specifics for Inpatients (complete in addition to above) Medication sheet sent with patient Intravenous medications needed or due during therapy sent with patient Drainage tubes (e.g. nasogastric tube or chest tube secured and  vented) Endotracheal or Tracheotomy tube secured Cuff deflated of air and inflated with saline Airway suctioned Electronic Signature(s) Signed: 05/02/2022 12:21:24 PM By: [name] RCP, RRT CHT , , Entered By: [name] on 05/02/2022 09:27:34 ,

## 2022-05-06 ENCOUNTER — Encounter: Payer: 59 | Admitting: Internal Medicine

## 2022-05-06 DIAGNOSIS — M86372 Chronic multifocal osteomyelitis, left ankle and foot: Secondary | ICD-10-CM | POA: Diagnosis not present

## 2022-05-06 LAB — GLUCOSE, CAPILLARY
Glucose-Capillary: 160 mg/dL — ABNORMAL HIGH (ref 70–99)
Glucose-Capillary: 165 mg/dL — ABNORMAL HIGH (ref 70–99)

## 2022-05-06 NOTE — Progress Notes (Signed)
HILBERTO, BURZYNSKI ([id number]) [id number] Page 1 of 2 Visit Report for 05/06/2022 Arrival Information Details Patient Name: Date of Service: Donnalee Curry RO LD F. 05/06/2022 8:00 A M Medical Record Number: [id number] Patient Account Number: [id number] Date of Birth/Sex: Treating RN: [birth year] (61 y.o. [name] Primary Care Kallon Caylor: [name] Other Clinician: [name] Referring Desiderio Dolata: Treating Steel Kerney/Extender: [name], [name] in Treatment: 12 Visit Information History Since Last Visit Added or deleted any medications: No Patient Arrived: Knee Scooter Any new allergies or adverse reactions: No Arrival Time: 08:00 Had a fall or experienced change in No Accompanied By: self activities of daily living that may affect Transfer Assistance: None risk of falls: Patient Identification Verified: Yes Signs or symptoms of abuse/neglect since No Secondary Verification Process Completed: Yes last visito Patient Requires Transmission-Based Precautions: No Hospitalized since last visit: No Patient Has Alerts: No Implantable device outside of the clinic No excluding cellular tissue based products placed in the center since last visit: Has Dressing in Place as Prescribed: Yes Has Footwear/Offloading in Place as Yes Prescribed: Left: Surgical Shoe with Pressure Relief Insole Pain Present Now: No Electronic Signature(s) Signed: 05/06/2022 10:35:48 AM By: [name] RCP, RRT CHT , , Entered By: [name] on 05/06/2022 08:41:04 , -------------------------------------------------------------------------------- Encounter Discharge Information Details Patient Name: Date of Service: Donnalee Curry RO LD F. 05/06/2022 8:00 A M Medical Record Number: [id number] Patient Account Number: [id number] Date of Birth/Sex: Treating RN: 1960-10-17 (61 y.o. [name] Primary Care Dannette Kinkaid: [name] Other  Clinician: [name] Referring Jisel Fleet: Treating Lillyana Majette/Extender: [name], [name] in Treatment: 13 Encounter Discharge Information Items Discharge Condition: Stable Ambulatory Status: Knee Scooter Discharge Destination: Home DEWARREN, LEDBETTER ([id number]) [id number] Page 2 of 2 Transportation: Private Auto Accompanied By: self Schedule Follow-up Appointment: Yes Clinical Summary of Care: Notes Patient has an HBO treatment scheduled on 05/07/22 at [place] am. Electronic Signature(s) Signed: 05/06/2022 10:35:48 AM By: [name] RCP, RRT CHT , , Entered By: [name] on 05/06/2022 10:35:18 , -------------------------------------------------------------------------------- Vitals Details Patient Name: Date of Service: Donnalee Curry RO LD F. 05/06/2022 8:00 A M Medical Record Number: [id number] Patient Account Number: [id number] Date of Birth/Sex: Treating RN: [birth year] (60 y.o. [name], [name] Primary Care Hilding Quintanar: [name] Other Clinician: [name] Referring Lorrena Goranson: Treating Mattix Imhof/Extender: [name], [name] [name], [name] in Treatment: 13 Vital Signs Time Taken: 07:59 Temperature (F): 97.9 Height (in): 70 Pulse (bpm): 72 Weight (lbs): 255 Respiratory Rate (breaths/min): 16 Body Mass Index (BMI): 36.6 Blood Pressure (mmHg): 122/68 Capillary Blood Glucose (mg/dl): 160 Reference Range: 80 - 120 mg / dl Electronic Signature(s) Signed: 05/06/2022 10:35:48 AM By: [name] RCP, RRT CHT , , Entered By: [name] on 05/06/2022 08:45:09 ,

## 2022-05-07 ENCOUNTER — Encounter: Payer: 59 | Admitting: Internal Medicine

## 2022-05-07 DIAGNOSIS — M86372 Chronic multifocal osteomyelitis, left ankle and foot: Secondary | ICD-10-CM | POA: Diagnosis not present

## 2022-05-07 LAB — GLUCOSE, CAPILLARY
Glucose-Capillary: 182 mg/dL — ABNORMAL HIGH (ref 70–99)
Glucose-Capillary: 201 mg/dL — ABNORMAL HIGH (ref 70–99)

## 2022-05-07 NOTE — Progress Notes (Signed)
[name] ([id number]) [id number] Page 1 of 2 Visit Report for 05/06/2022 HBO Details Patient Name: Date of Service: [name] RO LD F. 05/06/2022 8:00 A M Medical Record Number: [id number] Patient Account Number: [id number] Date of Birth/Sex: Treating RN: [birth year] (61 y.o. [name], [name] Primary Care Telena Peyser: [name] Other Clinician: [name] Referring Mayling Aber: Treating Aser Nylund/Extender: [name], [name] in Treatment: 13 HBO Treatment Course Details Treatment Course Number: 1 Ordering Oneita Allmon: [name] T Treatments Ordered: otal 60 HBO Treatment Start Date: 02/18/2022 HBO Indication: Chronic Refractory Osteomyelitis to Left Ankle and Foot HBO Treatment Details Treatment Number: 46 Patient Type: Outpatient Chamber Type: Monoplace Chamber Serial #: [id number] Treatment Protocol: 2.0 ATA with 90 minutes oxygen, and no air breaks Treatment Details Compression Rate Down: 1.5 psi / minute De-Compression Rate Up: 1.5 psi / minute Air breaks and breathing Decompress Decompress Compress Tx Pressure Begins Reached periods Begins Ends (leave unused spaces blank) Chamber Pressure (ATA 1 2 ------2 1 ) Clock Time (24 hr) 08:16 08:26 - - - - - - 09:56 10:06 Treatment Length: 110 (minutes) Treatment Segments: 4 Vital Signs Capillary Blood Glucose Reference Range: 80 - 120 mg / dl HBO Diabetic Blood Glucose Intervention Range: <131 mg/dl or >249 mg/dl Type: Time Vitals Blood Respiratory Capillary Blood Glucose Pulse Action Pulse: Temperature: Taken: Pressure: Rate: Glucose (mg/dl): Meter #: Oximetry (%) Taken: Pre 07:59 122/68 72 16 97.9 160 1 none per protocol Post 10:17 118/74 78 16 97.8 165 1 none per protocol Treatment Response Treatment Toleration: Well Treatment Completion Status: Treatment Completed without Adverse Event Macil Crady Notes no concerns with treatment given HBO Attestation I certify  that I supervised this HBO treatment in accordance with Medicare guidelines. A trained emergency response team is readily available per Yes hospital policies and procedures. Continue HBOT as ordered. [address] [name], [name] ([id number]) [id number] Page 2 of 2 Signed: 05/06/2022 5:57:59 PM By: [name] MD Previous Signature: 05/06/2022 10:35:48 AM Version By: [name] RCP, RRT CHT , , Previous Signature: 05/06/2022 4:14:14 PM Version By: [name] MD Entered By: [name] on 05/06/2022 17:55:25 -------------------------------------------------------------------------------- HBO Safety Checklist Details Patient Name: Date of Service: [name] RO LD F. 05/06/2022 8:00 A M Medical Record Number: [id number] Patient Account Number: [id number] Date of Birth/Sex: Treating RN: 07-16-1960 (61 y.o. [name], [name] Primary Care Arieona Swaggerty: [name] Other Clinician: [name] Referring Dale Ribeiro: Treating Shawnta Zimbelman/Extender: [name], [name] [name], [name] in Treatment: 13 HBO Safety Checklist Items Safety Checklist Consent Form Signed Patient voided / foley secured and emptied When did you last eato 07:00 am Last dose of injectable or oral agent 05/05/22 pm Ostomy pouch emptied and vented if applicable NA All implantable devices assessed, documented and approved NA Intravenous access site secured and place NA Valuables secured Linens and cotton and cotton/polyester blend (less than 51% polyester) Personal oil-based products / skin lotions / body lotions removed Wigs or hairpieces removed NA Smoking or tobacco materials removed NA Books / newspapers / magazines / loose paper removed NA Cologne, aftershave, perfume and deodorant removed Jewelry removed (may wrap wedding band) Make-up removed NA Hair care products removed Battery operated devices (external) removed NA Heating patches and  chemical warmers removed NA Titanium eyewear removed NA Nail polish cured greater than 10 hours NA Casting material cured greater than 10 hours NA Hearing aids removed NA Loose dentures or partials removed NA Prosthetics have been removed  NA Patient demonstrates correct use of air break device (if applicable) Patient concerns have been addressed Patient grounding bracelet on and cord attached to chamber Specifics for Inpatients (complete in addition to above) Medication sheet sent with patient Intravenous medications needed or due during therapy sent with patient Drainage tubes (e.g. nasogastric tube or chest tube secured and vented) Endotracheal or Tracheotomy tube secured Cuff deflated of air and inflated with saline Airway suctioned Electronic Signature(s) Signed: 05/06/2022 10:35:48 AM By: [name] RCP, RRT CHT , , Entered By: [name] on 05/06/2022 08:47:49 ,

## 2022-05-07 NOTE — Progress Notes (Signed)
STEWARD, SAMES ([id number]) [id number] Page 1 of 2 Visit Report for 05/07/2022 Arrival Information Details Patient Name: Date of Service: [name] [name] LD F. 05/07/2022 8:00 A M Medical Record Number: [id number] Patient Account Number: [id number] Date of Birth/Sex: Treating RN: 03/12/1961 (61 y.o. [name] Primary Care [name]: [name] Other Clinician: Jacqulyn [name] Referring [name]: Treating [name]/Extender: [name], [name] [name] in Treatment: 66 Visit Information History Since Last Visit Added or deleted any medications: No Patient Arrived: Knee Scooter Any new allergies or adverse reactions: No Arrival Time: 07:55 Had a fall or experienced change in No Accompanied By: self activities of daily living that may affect Transfer Assistance: None risk of falls: Patient Identification Verified: Yes Signs or symptoms of abuse/neglect since No Secondary Verification Process Completed: Yes last visito Patient Requires Transmission-Based Precautions: No Hospitalized since last visit: No Patient Has Alerts: No Implantable device outside of the clinic No excluding cellular tissue based products placed in the center since last visit: Has Dressing in Place as Prescribed: Yes Has Footwear/Offloading in Place as Yes Prescribed: Left: Surgical Shoe with Pressure Relief Insole Pain Present Now: No Electronic Signature(s) Signed: 05/07/2022 10:56:29 AM By: [name] RCP, RRT CHT , , Entered By: [name] on 05/07/2022 08:56:27 , -------------------------------------------------------------------------------- Encounter Discharge Information Details Patient Name: Date of Service: [name] [name] LD F. 05/07/2022 8:00 A M Medical Record Number: [id number] Patient Account Number: [id number] Date of Birth/Sex: Treating RN: 15-Jun-1961 (61 y.o. [name] Primary Care Carlesha Seiple: [name] Other  Clinician: Jacqulyn [name] Referring [name]: Treating [name]/Extender: [name] [name], [name] [name] in Treatment: 14 Encounter Discharge Information Items Discharge Condition: Stable Ambulatory Status: Knee Scooter Discharge Destination: Home [name] ([id number]) [id number] Page 2 of 2 Transportation: Private Auto Accompanied By: self Schedule Follow-up Appointment: Yes Clinical Summary of Care: Notes Patient has an HBO treatment scheduled on 05/08/22 at 08:00 am. Electronic Signature(s) Signed: 05/07/2022 10:56:29 AM By: [name] RCP, RRT CHT , , Entered By: [name] on 05/07/2022 10:54:21 , -------------------------------------------------------------------------------- Vitals Details Patient Name: Date of Service: [name] [name] LD F. 05/07/2022 8:00 A M Medical Record Number: [id number] Patient Account Number: [id number] Date of Birth/Sex: Treating RN: 1960-11-07 (61 y.o. [name], [name] Primary Care [name]: [name] Other Clinician: Jacqulyn [name] Referring [name]: Treating [name]/Extender: [name], [name] [name], [name] in Treatment: 14 Vital Signs Time Taken: 08:00 Temperature (F): 98.0 Height (in): 70 Pulse (bpm): 72 Weight (lbs): 255 Respiratory Rate (breaths/min): 16 Body Mass Index (BMI): 36.6 Blood Pressure (mmHg): 118/64 Capillary Blood Glucose (mg/dl): 182 Reference Range: 80 - 120 mg / dl Electronic Signature(s) Signed: 05/07/2022 10:56:29 AM By: [name] RCP, RRT CHT , , Entered By: [name] on 05/07/2022 08:56:59 ,

## 2022-05-08 ENCOUNTER — Encounter (HOSPITAL_BASED_OUTPATIENT_CLINIC_OR_DEPARTMENT_OTHER): Payer: 59 | Admitting: Internal Medicine

## 2022-05-08 DIAGNOSIS — L97524 Non-pressure chronic ulcer of other part of left foot with necrosis of bone: Secondary | ICD-10-CM

## 2022-05-08 DIAGNOSIS — E11621 Type 2 diabetes mellitus with foot ulcer: Secondary | ICD-10-CM

## 2022-05-08 DIAGNOSIS — M86372 Chronic multifocal osteomyelitis, left ankle and foot: Secondary | ICD-10-CM | POA: Diagnosis not present

## 2022-05-08 LAB — GLUCOSE, CAPILLARY
Glucose-Capillary: 107 mg/dL — ABNORMAL HIGH (ref 70–99)
Glucose-Capillary: 125 mg/dL — ABNORMAL HIGH (ref 70–99)
Glucose-Capillary: 128 mg/dL — ABNORMAL HIGH (ref 70–99)
Glucose-Capillary: 175 mg/dL — ABNORMAL HIGH (ref 70–99)
Glucose-Capillary: 196 mg/dL — ABNORMAL HIGH (ref 70–99)

## 2022-05-08 NOTE — Progress Notes (Signed)
TAEDEN, GELLER ([id number]) [id number] Page 1 of 11 Visit Report for 05/07/2022 HPI Details Patient Name: Date of Service: [name] RO LD F. 05/07/2022 10:30 A M Medical Record Number: [id number] Patient Account Number: [id number] Date of Birth/Sex: Treating RN: [birth year] (61 y.o. [name] Primary Care Provider: [name] Other Clinician: Massie Kluver Referring Provider: Treating Provider/Extender: RO BSO N, [name] [name], [name] in Treatment: 14 History of Present Illness HPI Description: Pleasant 61 year old with history of diabetes (Hgb A1c 10.8 in 2014) and peripheral neuropathy. No PVD. L ABI 1.1. Status post right great toe partial amputation years ago. He was at work and on 10/22/2014, was injured by a cart, and suffered an ulceration to his left anterior calf. He says that it subsequently became infected, and he was treated with a course of antibiotics. He was found on initial exam to have an ulceration on the dorsum of his left third toe. He was unaware of this and attributes it to pressure from his steel toed boots. More recently he injured his right anterior calf on a cart. Ambulating normally per his baseline. He has been undergoing regular debridements, applying mupirocin cream, and an Ace wrap for edema control. He returns to clinic for follow-up and is without complaints. No pain. No fever or chills. No drainage. 10/25/15; this is a 61 year old man who has type II diabetes with diabetic polyneuropathy. He tells me that he fractured his left fifth metatarsal in June 2016 when he presented with swelling. He does not recall a specific injury. His hemoglobin A1c was apparently too high at the time for consideration of surgery and he was put in some form of offloading. Ultimately he went to surgery in December with an allograft from his calcaneus to this site, plate and screws. He had an x-ray of the foot in March that showed  concerns about nonunion. He tells me that in March he had to move and basically moved himself. He was on his foot a lot and then noticed some drainage from an open area. He has been following with his orthopedic surgeon Dr. Doran Durand. He has been applying a felt donut, dry dressing and using his heel healing sandal. 11/01/15; this is a patient I saw last week for the first time. He had a small open wound on the plantar aspect of his left foot at roughly the level of the base of his fifth metatarsal. He had a considerable degree of thickened skin around this wound on the plantar aspect which I thought was from chronic pressure on this area. He tells [place] that he had drainage over the course of the week. No systemic symptoms. 11/08/15; culture last week grew Citrobacter korseri. This should've been sensitive to the Augmentin I gave him. He has seen Dr. Doran Durand who did his initial surgery and according the patient the plan is to give this another month and then the hardware might need to come out of this. This seems like a reasonable plan. I will adjust his antibiotics to ciprofloxacin which probably should continue for at least another 2 weeks. I gave him 10 days worth today 11/22/15 the patient has completed antibiotics. He has an appointment with Dr. Doran Durand this Friday. There is improved dimensions around the wound on the left fifth metatarsal base 11/29/15; the patient has completed antibiotics last week. Apparently his appointment with Dr. Doran Durand it is not until this Friday. Dimensions are roughly the same. 12/06/15; saw Dr. Doran Durand. No x-ray told the end  of the month, next appointment June 30. We have been using Aquacel Ag 12/13/15: No major change this week. Using Aquacel AG 611/17; arrived this week with maceration around the wound. There was quite a bit of undermining which required surgical debridement. I changed him to [facility] last week, by the patient's admission he was up on this more this  week 12/27/15; macerated tissue around the wound is removed with a scalpel and pickups. There is no undermining. Nonviable subcutaneous tissue and skin taken from the superior circumference of the wound is slough from the surface. READMISSION 03/06/16 since I last saw this patient at the end of June, he went for surgery on 01/11/16 by Dr. Doran Durand of orthopedics. He had a left foot irrigation and debridement, removal of hardware and placement of wound VAC. He is also been followed by Dr. Megan Salon of infectious disease and completed a six-week course of IV Rocephin for group B strep and Enterobacter in the bone at the time of surgery. Apparently at the time of surgery the bone looked healthy so I don't think any bone was actually removed. He has been using silver alginate based dressings on the same wound area at the base of the left fifth metatarsal on the left. I note that he is also had arterial studies on 01/08/16, these showed a left ABI of 1.2 to and a right ABI of 1.3. Waveforms were listed as biphasic. He was not felt to have any specific arterial issues. 03/13/16; no real change in the condition of the wound at the left lateral foot at roughly the base of his fifth metatarsal. Use silver alginate last week. 03/18/16 arrives today with no open area. Being suspicious of the overlying callus I. Some of this back although I see nothing but covering tissue here/epithelium. There is no surrounding tenderness READMISSION 04/16/16 this is a patient I discharged about a month ago. Initially a surgical wound on the lateral aspect of his left foot which subsequently became infected. The story he is giving today that he went back into his own modified shoe started to notice pain 2 weeks ago he was seen in Dr. Nona Dell office by a physician assistant last Wednesday and according the patient was told that everything looked fine however this is clearly now broken open and he has an open wound in the same spot that  we have been dealing with repetitively. Situation is complicated by the fact that he is running short of money on long-term disability. He has not taken his insulin and at least 2 weeks was previously on NovoLog short acting insulin on a sliding scale and TRESIBA 35 units at bedtime. He is no longer able to afford any of his medication he was in the x-ray on 10/15. A plain x-ray showed healed fracture of the left fifth metatarsal bone status post removal of the associated plate and screw fixation hardware. There was no acute appearing osseous abnormality. His blood work showed a white count of 9.2 with an essentially normal differential comprehensive metabolic panel was normal. Previous CT scan of the foot on 01/08/16 showed no osteomyelitis previous vascular workup showed no evidence of significant PAD on 01/13/16 04/23/16; culture I did last week grew enterococcus [ampicillin sensitive] and MRSA. He saw infectious disease yesterday. They stop the clindamycin and VIRL, COBLE ([id number]) [id number] Page 2 of 11 ordered an MRI. This is not unreasonable. All the hardware is out of the foot at this point. 04/30/16 at this point in time we  are still awaiting the results of the MRI at this point in time. Patient did have an area which appears to be somewhat macerated in the proximal portion of the wound where there is overlying necrotic skin that is doing nothing more than trapping fluid underneath. He continues to state that he does have some discomfort and again the concern is for the possibility of osteomyelitis hence the reason for the MRI order. We have been using a silver alginate dressing but again I think the reason this with his macerated as it was is that the dressing obscene could not reach the entirety of the wound due to some necrotic skin covering the proximal portion. No bleeding noted at this point in time on initial evaluation. 05/07/16 we receive the results  of patient's MRI which shows that he has no evidence of osteomyelitis. This obviously is excellent news. Patient was definitely happy to hear this. He tells me the wound appears to be doing very well at this point in time and is pleased with the progress currently. 05/15/16; as noted the patient did not have osteomyelitis. He has been released by infectious disease and orthopedics. His wound is still open he had a debridement last week but complain when he got home he "bleeding for half a day". He is not had any pain. We have been using silver alginate with Kerlix gauze wrap. 05/28/16; patient arrives today with the wound in much the same condition as last time. He has a small opening on the lateral aspect of his foot however with debridement there is clear undermining medially there is no real evidence of infection here and I didn't see any point in culturing this. One would have to wonder if this isn't a simple matter of in adequate offloading as he is been using a healing sandal. 06/05/16; the open area is now on the plantar aspect of his foot and not decide. The wound almost appears to have "migrated". This was the term use by our intake nurse. 06/12/16; open area on the plantar aspect of his foot. Base of the wound looks very healthy. This will be his second week in a total contact cast 06/19/16; patient arrived today in a total contact cast. There was some expectation from our staff and myself that this area would be healed. Unfortunately the area was boggy and with rec pressure a fairly substantial amount of purulent drainage was obtained. Specimen obtained for culture. The patient had no complaints of systemic problems including fever or chills or instability of his diabetes. There was no pain in the foot. Nevertheless a extensive debridement was required. 06/26/16; patient's culture from the abscess last week grew a combination of MRSA and ampicillin sensitive enterococcus. I had him on  Augmentin and Septra however I have elected to give him a full 10 day course of Zyvox instead as I Recently treated this combination of organisms with Augmentin and Septra before. Arrives today with no systemic symptoms 07/03/16; the patient has 2 more treatments of Zyvox and then he is finished antibiotics the wound has improved now mostly on the lateral aspect of his foot. There is still some tenderness when he walks. 07/10/16; patient arrives today with Zyvox completed. He only has a small open area remaining. 07/24/16; she arrives here with no overt open area. The covering is thick callus/eschar. Nevertheless there is no open area here. He has some tenderness underneath the area but no overt infection is observed no drainage. The patient has a deformity in  the foot with this area weekly to be exposed to more pressure in his foot where nevertheless that something we are going to have to deal with going forward. The patient has diabetic workboots and diabetic shoes. He has had a long difficult course with the area here. This started as a fracture at work. He had bone grafting from his calcaneus and screws. This got infected he had to have more surgery on the area. Bone at the time of that surgery I think showed enterococcus and group B strep. He had 6 weeks of Rocephin. Since then the area has waxed and waned in its difficulty. Recently he had an MRI in December that did not show osteomyelitis nevertheless he had an abscess that grew MRSA and enterococcus which I elected to treat with Zyvox. This was in December and this wound is actually "healed" over 08/21/16; the patient came in today for his one-month follow-up visit. The area on the lateral aspect of his left foot looks much the same as some month ago. There is no evidence of an open wound here. However the patient tells me about a week after he went back to work he developed severe pain and swelling in the plantar aspect of his right foot first and  fifth metatarsal heads. 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 He went to see his podiatrist at [facility]. She told him that he could not work on his feet. She told him to go back and his cam walking boot on the left. Not have open wounds obviously on the right. The patient is actually gone ahead and retired from his job because he does not feel he can work on his feet 09/18/16; patient comes back in saying he recently had some pain on the lateral aspect of his left foot. Asked that we look at this. Other than that all of his wounds have a viable surface. He has diabetic shoes. He is retired from work. READMISSION Garl Speigner is a 61 year old man who we cared for for a prolonged period of time in late 2017 into March 2018. At that point he had suffered a fracture of his left fifth metatarsal at about the level of the base of the fifth metatarsal. He had surgical repair by Dr. Meryl Dare and he developed a very refractory wound over the fifth metatarsal. Ultimately this became infected he required hardware removal this eventually closed over and we discharged from the clinic in March of this year. He tells me as well until roughly 3 weeks ago when he developed increasing pain in the same area making it difficult for him to sleep. He was admitted to hospital from 9/5 through 9/7 with now an ulcer in the same area on the lateral aspect of his left foot at the level of the base of the metatarsal. X- rays and MRIs during this hospitalization were negative. Wound culture showed a combination of Escherichia coli, Morganella and Pseudomonas. He was given IV Cipro and vancomycin the hospital and then discharged home on Cipro and Doxy. He returned to the ER on 9/16 with leg swelling and discomfort. I don't think anything was added at that point although he did have an ultrasound of the left leg that was negative for DVT He  was back in the ER on 10/4 again with . pain in the area he was given Bactrim but states he had a significant allergic  reaction to that which has abated once he stopped the Bactrim. I don't think he had a full course. He went to see his podiatrist yesterday who recommended some form of foot soak. He has a Darco forefoot offloading boot The patient is a type II diabetic on insulin. T me his recent hemoglobin A1c was 7. Arterial studies done in July 2017 showed an ABI in the right of 1.3 on the ells left 1.2 to biphasic waveforms bilaterally. He was not felt to have arterial disease. As noted the patient has had 2 MRIs most recently on 10/4 this showed no evidence of an abscess or osteomyelitis and no change since the prior study of 03/05/17 there was nonspecific edema on the dorsum of the foot. ABIs in this clinic today 1.2 which would be unchanged from his study done in July/17 04/15/17; now down to 1 small wound on the left lateral foot. We wrapped him and applied collagen last week and the foot is fairly macerated. 04/22/17; small wound on the left lateral foot however it has some undermining. Using collagen 04/29/17; small wound on the left lateral foot some surrounding callus. Chronic damage in this area as a result of initial fracture nonhealing and secondary infection. 05/06/17; the patient arrives today with the area on the left lateral foot closed. There is thick subcutaneous tissue with a layer of callus. I took some of the callus off just to ensure adequate closure of the underlying tissue and there is no open wound here. He has considerable deformity of this area of his foot however and unless there is an ability to offload this I think opening is going to be recurrent. He tells me he has diabetic foot wear although I have not actually seen this Readmission: 09/02/17 on evaluation today patient appears to be doing a little better compared to what he tells me what's going on in regard to his  left lateral foot. He was previously seen in the ER this past Saturday it is now Tuesday. On Saturday he actually was treated with antibiotics including Cipro 500 mg two times a day and doxycycline 100 mg two times a day. Subsequently he also did have an x-ray performed of his left foot which revealed increased degenerative changes of the base of the fifth metatarsal since comparison in 2018. There was no radiographic evidence of osteomyelitis however. Patient has previously had an MRI of the left foot which was performed on 04/03/17 in this revealed at that point a soft tissue ulceration but no evidence of osteomyelitis. Patient has previously undergone testing in regard to his ABI's by Dr. Bridgett Larsson and this showed that he did have ABI was within normal limits which does correspond with ABI's we checked here in the office today. That study was on 01/13/16. With all that being said on evaluation today there does not appear to be any evidence of a true open wound of the left lateral fifth metatarsal region. He does have tenderness noted as well is a little bit of boggy/fluctuance feeling to the area in this region of his foot and there is definitely a very solid and firm callous noted laterally. With that being said I am not able to find any obvious opening at this point there does appear to be a spot where there appears to been a opening very recently however patient states he has not noted any drainage from this currently. He does however have discomfort with palpation of this area this is in the 3-4/10 range  only with very firm palpation this is not too significant at all. Subsequently he does have a small ulcer at the medial nail bed of the right second toe where he inadvertently pulled off a portion of the toenail softly causing this injury. Otherwise there does not appear to be any evidence of infection in regard to this right second toe. 09/23/17 on evaluation today patient actually appears to be doing  well he has no open ulcerations noted at this point. With that being said he did have significant callous noted over the left lateral foot which did require callous pairing today he tolerated this without complication. Readmission: SIDDHARTH, BABINGTON ([id number]) [id number] Page 3 of 11 11/07/17 patient presents today for readmission concerning to ulcers that he has. One on the right plantar fifth metatarsal region and the other on the left plantar fifth metatarsal region. He has previously had an x-ray which was performed on 11/05/17 and revealed that the patient had wounds noted of the base of the fifth metatarsal region without evidence of destruction to the bone to suggest osteomyelitis. Obviously this is excellent news. With that being said he does have a significantly large ulcer that extensively plantar surface of the wound and is concerning for the fact that he continues to walk on this due to the patient telling me that he "has to work" I do believe this has likely led to both the opening of the wound as well as the fact that is not really able to heal very well at this point. He has been seen by his podiatrist and apparently according to the note dated 10/23/17 the patient was provided with Santyl and recommended a dry dressing. No wound culture has been obtained at this point. As stated above he has had returned to work due to financial reasons and is not able to wear his postop shoes due to the fact that he cannot wear this and work. The wound is stated to have been present for "several weeks" prior to the visit with the podiatrist on 10/23/17. With that being said again at least the good news is there does not appear to be any evidence of osteomyelitis. 11/14/17 on evaluation today patient appears to be doing poorly in regard to his bilateral foot ulcers. Unfortunately he seems to have significant callous with a lot of maceration underlined especially on the right even  more so than the left. He has been tolerating the dressing changes without complication. With that being said overall I do think he's gonna have to have a significant debridement today. 11/21/17 on evaluation today patient presents for follow-up concerning his bilateral lateral foot ulcers. He has been tolerating the dressing changes without complication. His wife helps perform these for him. With that being said the patient states that the left lateral foot ulcer actually is continuing to give him trouble as far as discomfort is concerned. With that being said actually appears better. 11/28/17 on evaluation today patient appears to be doing poorly in regard to his left lower extremity ulcer in particular. His right as made some progress on the lateral portion although the plantar portion is actually getting somewhat worse I think this is likely due to the fact that he continues to work although it hurts and he's been having a lot of issues out of this side and as far as pain is concerned. With that being said my biggest concern on evaluation today is that of the patient having issues with increased pain in the left  lower extremity ulcer site especially on the plantar aspect. He also appears to have what seems to be an abscess developing in the dorsal/lateral portion of his foot which I think likely is communicating somewhere internally with the ulcer site. Nonetheless he has not had any fevers, chills, nausea, or vomiting noted at this time. He has however had fairly significant increase in pain and he was Artie having a lot of pain previous. Readmission: 01-29-2022 upon evaluation today patient presents for initial inspection here in our clinic concerning the current issue which has been present since around Oct 29, 2021. The patient tells me that he has at this point been seen by Dr. Vickki Muff who has been managing this up to this point. He has also been seen by Dr. Lazaro Arms I will outline the findings there  shortly as well. With that being said currently according to Dr. Alvera Singh note on 12-18-2021 the patient has a prescription for Zosyn that was given by infectious disease. Again that was back in June however. There was a hospital encounter for incision and drainage and debridement of the foot in order to clearway necrotic tissue. The patient also underwent vascular evaluation with Dr. Lucky Cowboy. Nonetheless on the postop visit on 12-18-2021 the patient was seeming to be doing quite well based on what Dr. Vickki Muff said but nonetheless since that time is really not made much progress and is concerned about losing his foot. He therefore requested a referral to wound care to be seen and evaluated and see if there is anything we can do to help and aid with getting this to heal. Fortunately there does not appear to be any evidence of active infection locally or systemically at this time which is great news. He tells me that he is currently using Betadine wet-to-dry dressings. He does not note any systemic infections but does have evidence of local infection based on what we are seeing. He has had a recent chest x-ray which showed that he had no evidence of acute cardiopulmonary disease that was performed on 12-12-2021. He also underwent vascular intervention with Dr. 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 Subsequently the patient states that Dr. Lucky Cowboy has mentioned that he would need to go back at some time with Dr. Lucky Cowboy said he would consider a posterior tibial approach for revascularization in the future but at this time because of kidney status and the amount of dye used he did not want to proceed any further at that point. Again that something that would likely be scheduled down the road. Nonetheless right now the patient does have an ABI on the left of 0.90 with a  TBI of 0.6-0 but this was decreased compared to previous. His ABI on the right was 1.08. Patient did have as well MRI of the left foot which was performed on 12-12-2021 and this shows that he does have interval resection of the proximal third of the fifth metatarsal and area of osteomyelitis on prior MRI. There is persistent ulceration of plantar lateral aspect at the resection site with a sinus tract extending towards the adjacent cuboid bone findings suspicious for osteomyelitis of the plantar lateral aspect of the cuboid and adjacent base of the fourth metatarsal possibly in the proximal most aspect of the remaining fifth metatarsal shaft as well. The ABI performed on  12-13-2021 showed that the left ABI was 0.83 which had previously been registered at 1.35 and the right ABI was 1.20. I think this is consistent with being able to heal but obviously would do better with vascular intervention not something that is going to be looked into in the future. This was noted above as well. Based on what I am seeing today I do believe that the patient is definitely a candidate for HBO therapy I think this may be in his best interest as I think he is at the point of being in a limb threatening situation. I do believe that he would benefit from hyperbarics along with continued antibiotics along with good wound care measures as well. 02-04-2022 upon evaluation today patient appears to be doing still poorly in regard to his foot he is having a lot of drainage and he notes odor as well. With that being said we still have not heard anything back from insurance yet as far as approval for HBO therapy. Subsequently the patient does have evidence of osteomyelitis and I think hyperbarics is definitely going to be something that would be beneficial for him. He has been on antibiotics several times previous but unfortunately this just does not seem to be sustained enough we did do a culture it showed Pseudomonas as one of the  primary organisms both organisms would be treated with Levaquin which I think is going to be the best way to go. I discussed that with the patient today and I Georgina Peer send that into the pharmacy. 02-11-2022 upon evaluation today patient appears to be doing well currently in regard to his wound. I do feel like the Levaquin is doing better for him which is great news. Fortunately I do not see any evidence of active infection locally or systemically at this time which is great news. No fevers, chills, nausea, vomiting, or diarrhea. 02-19-2022 upon evaluation today patient appears to be doing excellent in regard to his foot ulcer all things considered he still has bone exposed but the good news is we are at least making some progress here I think he does have a lot of odor he tells me and I think switching over to Dakin's moistened gauze dressing is probably can be the way to go. 02-26-2022 upon evaluation today patient appears to be doing well currently in regard to his wound all things considered. Although the 1 thing I do see is that he is not changing the dressing as frequently as he should be. Fortunately there does not appear to be any signs of active infection he tells me his wife is out of town from Saturday through today so it has not been changed since Saturday before that they have been doing this every other day. I explained that with the Dakin's he needs to do this every day in order to make sure that it heals appropriately. This will also help with the odor which she has been complaining of. 03-11-2022 upon evaluation today patient appears to be doing better overall visually in regard to his wound. He is tolerating the dressing changes without complication. Fortunately there does not appear to be any evidence of active infection locally or systemically at this time. No fevers, chills, nausea, vomiting, or diarrhea. 03-19-2022 upon evaluation today patient appears to be doing decently well in regard  to his wound. He is actually tolerating dressing changes without Complication. Fortunately there does not appear to be any signs of active infection locally or systemically at this time  which is great news. No fevers, chills, nausea, vomiting, or diarrhea. 04-02-2022 upon evaluation today patient's wound is actually showing signs of significant improvement I am actually very pleased with where we stand today. I do not see any evidence of infection locally or systemically at this time which is great news. 04-08-2022 upon evaluation today patient actually appears to be making good progress. I am very pleased with where things stand his wound is smaller he seems to be tolerating hyperbarics well he is doing well with wound care and in general I think we are making excellent headway here. I do believe that he would likely benefit from extending the hyperbarics. I think that he is making such good progress I do not want to see anything backtrack on him at this point. Overall the benefit coupled with the antibiotics and an excellent wound care that we have been working with him with and that his wife is continuing to do at home I think he is making a great impact in getting this healed to prevent limb loss. 04-16-2022 upon evaluation today patient appears to be doing excellent with the [facility] dressing. Coupled with hyperbarics he is managing quite nicely DONI, WIDMER ([id number]) [id number] Page 4 of 11 and healing quite nicely. Fortunately I do not see any evidence of active infection locally or systemically which is great news and overall I am extremely pleased with where we stand today. No fevers, chills, nausea, vomiting, or diarrhea. 04-23-2022 upon evaluation today patient appears to be doing well currently in regard to his wound for the most part although there is some area of rubbing on the lateral aspect as well as the top of his foot. When questioned he  actually has been more active over the weekend he tells me that it was his birthday and he had some friends over. Subsequently I think this is what led to things being somewhat worse compared to what they were previous. Fortunately I do not see any evidence of active infection locally or systemically at this time which is great news. No fevers, chills, nausea, vomiting, or diarrhea. We are still waiting on the approval for continuation of HBO therapy. 04-30-2022 upon evaluation today patient's wound is actually showing signs of improvement. He continues to utilize the knee scooter which I think is doing well for him. The volume continues to improve the size got a little worse last week due to the fact that he was up on this more. The good news is he is actually back down to more normal situation at this point today. There is some need for sharp debridement. 11/7 patient was seen in conjunction with HBO which she is tolerating well. HBO dive #47 today. Wound is on the left plantar foot on the lateral aspect. He has been making excellent progress. Electronic Signature(s) Signed: 05/07/2022 5:01:28 PM By: [name] MD Entered By: [name] on 05/07/2022 11:46:26 -------------------------------------------------------------------------------- Physical Exam Details Patient Name: Date of Service: [name] RO LD F. 05/07/2022 10:30 A M Medical Record Number: [id number] Patient Account Number: [id number] Date of Birth/Sex: Treating RN: 1960-09-11 (61 y.o. [name] Primary Care Provider: [name] Other Clinician: Massie Kluver Referring Provider: Treating Provider/Extender: RO BSO N, [name] [name], [name] in Treatment: 14 Notes Wound examination the patient has healthy granulation. Looking back through the last several weeks of photographs and I he will shows good improvement including improvement in the depth which was initially quite prominent in the mid  aspect of  the wound. He is measuring smaller. He has thick callus and skin around the edges which is generally something I do not like to see but given the improvement in all aspects of the wound no debridement was done Electronic Signature(s) Signed: 05/07/2022 5:01:28 PM By: [name] MD Entered By: [name] on 05/07/2022 11:47:14 -------------------------------------------------------------------------------- Physician Orders Details Patient Name: Date of Service: [name] RO LD F. 05/07/2022 10:30 A M Medical Record Number: [id number] Patient Account Number: [id number] Date of Birth/Sex: Treating RN: [birth year] (61 y.o. [name] Primary Care Provider: [name] Other Clinician: Massie Kluver Referring Provider: Treating Provider/Extender: [name], [name] in Treatment: 14 Verbal / Phone Orders: No Diagnosis [address] JERAD, DUNLAP ([id number]) [id number] Page 5 of 11 Follow-up Appointments Return Appointment in 1 week. Bathing/ Shower/ Hygiene May shower with wound dressing protected with water repellent cover or cast protector. Anesthetic (Use 'Patient Medications' Section for Anesthetic Order Entry) Lidocaine applied to wound bed Edema Control - Lymphedema / Segmental Compressive Device / Other Elevate, Exercise Daily and A void Standing for Long Periods of Time. Elevate legs to the level of the heart and pump ankles as often as possible Elevate leg(s) parallel to the floor when sitting. Off-Loading Open toe surgical shoe Other: - knee scooter Additional Orders / Instructions Other: - need daily dressing changes Hyperbaric Oxygen Therapy Wound #14 Left,Lateral Foot Indication and location: - Left foot osteomyelitis If appropriate for treatment, begin HBOT per protocol: 2.0 ATA for 90 Minutes without A Breaks ir One treatment per day (delivered Monday through Friday unless otherwise specified in Special  Instructions below): Total # of Treatments: - 40 additional 40 requested for a total of 80 A ntihistamine 30 minutes prior to HBO Treatment, difficulty clearing ears. Finger stick Blood Glucose Pre- and Post- HBOT Treatment. Follow Hyperbaric Oxygen Glycemia Protocol Wound Treatment Wound #14 - Foot Wound Laterality: Left, Lateral Cleanser: Byram Ancillary Kit - 15 Day Supply (Generic) 3 x Per Week/30 Days Discharge Instructions: Use supplies as instructed; Kit contains: (15) Saline Bullets; (15) 3x3 Gauze; 15 pr Gloves Cleanser: Wound Cleanser 3 x Per Week/30 Days Discharge Instructions: Wash your hands with soap and water. Remove old dressing, discard into plastic bag and place into trash. Cleanse the wound with Wound Cleanser prior to applying a clean dressing using gauze sponges, not tissues or cotton balls. Do not scrub or use excessive force. Pat dry using gauze sponges, not tissue or cotton balls. Prim Dressing: Hydrofera Blue Ready Transfer Foam, 4x5 (in/in) (Dispense As Written) 3 x Per Week/30 Days ary Discharge Instructions: Apply Hydrofera Blue Ready to wound bed as directed Secondary Dressing: ABD Pad 5x9 (in/in) (Generic) 3 x Per Week/30 Days Discharge Instructions: Cover with ABD pad Secured With: Medipore T - 37M Medipore H Soft Cloth Surgical T ape ape, 2x2 (in/yd) (Generic) 3 x Per Week/30 Days Secured With: [employer] Sterile or Non-Sterile 6-ply 4.5x4 (yd/yd) (Generic) 3 x Per Week/30 Days Discharge Instructions: Apply Kerlix as directed Granite pre-HBO capillary blood glucose (ensure 1 physician order is in chart). A. Notify HBO physician and await physician orders. 2 If result is 70 mg/dl or below: B. If the result meets the hospital definition of a critical result, follow hospital policy. A. Give patient an 8 ounce Glucerna Shake, an 8 ounce Ensure, or 8 ounces of  a Glucerna/Ensure equivalent dietary supplement*. B. Wait 30 minutes. If  result is 71 mg/dl to 130 mg/dl: C. Retest patients capillary blood glucose (CBG). D. If result greater than or equal to 110 mg/dl, proceed with HBO. If result less than 110 mg/dl, notify HBO physician and consider holding HBO. If result is 131 mg/dl to 249 mg/dl: A. Proceed with HBO. A. Notify HBO physician and await physician orders. B. It is recommended to hold HBO and do If result is 250 mg/dl or greater: blood/urine ketone testing. C. If the result meets the hospital definition of a ARIEON, CORCORAN ([id number]) [id number] Page 6 of 11 critical result, follow hospital policy. POST-HBO GLYCEMIA INTERVENTIONS ACTION INTERVENTION Obtain post HBO capillary blood glucose (ensure 1 physician order is in chart). A. Notify HBO physician and await physician orders. 2 If result is 70 mg/dl or below: B. If the result meets the hospital definition of a critical result, follow hospital policy. A. Give patient an 8 ounce Glucerna Shake, an 8 ounce Ensure, or 8 ounces of a Glucerna/Ensure equivalent dietary supplement*. B. Wait 15 minutes for symptoms of If result is 71 mg/dl to 100 mg/dl: hypoglycemia (i.e. nervousness, anxiety, sweating, chills, clamminess, irritability, confusion, tachycardia or dizziness). C. If patient asymptomatic, discharge patient. If patient symptomatic, repeat capillary blood glucose (CBG) and notify HBO physician. If result is 101 mg/dl to 249 mg/dl: A. Discharge patient. A. Notify HBO physician and await physician orders. B. It is recommended to do blood/urine ketone If result is 250 mg/dl or greater: testing. C. If the result meets the hospital definition of a critical result, follow hospital policy. *Juice or candies are NOT equivalent products. If patient refuses the Glucerna or Ensure, please consult the hospital dietitian for an appropriate  substitute. Electronic Signature(s) Signed: 05/07/2022 5:01:28 PM By: [name] MD Signed: 05/07/2022 5:15:48 PM By: Massie Kluver Entered By: Massie Kluver on 05/07/2022 11:20:09 -------------------------------------------------------------------------------- Problem List Details Patient Name: Date of Service: [name] RO LD F. 05/07/2022 10:30 A M Medical Record Number: [id number] Patient Account Number: [id number] Date of Birth/Sex: Treating RN: 05/22/1961 (61 y.o. [name] Primary Care Provider: [name] Other Clinician: Massie Kluver Referring Provider: Treating Provider/Extender: RO BSO N, [name] in Treatment: 14 Active Problems ICD-10 Encounter Code Description Active Date MDM Diagnosis [phone] Chronic multifocal osteomyelitis, left ankle and foot 01/29/2022 No Yes E11.621 Type 2 diabetes mellitus with foot ulcer 01/29/2022 No Yes L97.524 Non-pressure chronic ulcer of other part of left foot with necrosis of bone 01/29/2022 No Yes T81.31XA Disruption of external operation (surgical) wound, not elsewhere classified, 01/29/2022 No Yes initial encounter TOWNSEND, CUDWORTH ([id number]) [id number] Page 7 of 11 Inactive Problems Resolved Problems Electronic Signature(s) Signed: 05/07/2022 5:01:28 PM By: [name] MD Entered By: [name] on 05/07/2022 11:45:14 -------------------------------------------------------------------------------- Progress Note Details Patient Name: Date of Service: [name] RO LD F. 05/07/2022 10:30 A M Medical Record Number: [id number] Patient Account Number: [id number] Date of Birth/Sex: Treating RN: Mar 12, 1961 (61 y.o. [name] Primary Care Provider: [name] Other Clinician: Massie Kluver Referring Provider: Treating Provider/Extender: RO BSO N, [name] [name], [name] in Treatment: 14 Subjective History of Present Illness (HPI) Pleasant  61 year old with history of diabetes (Hgb A1c 10.8 in 2014) and peripheral neuropathy. No PVD. L ABI 1.1. Status post right great toe partial amputation years ago. He was at work and on 10/22/2014, was injured by a cart, and suffered an ulceration to his left anterior calf. He says that it subsequently became infected, and  he was treated with a course of antibiotics. He was found on initial exam to have an ulceration on the dorsum of his left third toe. He was unaware of this and attributes it to pressure from his steel toed boots. More recently he injured his right anterior calf on a cart. Ambulating normally per his baseline. He has been undergoing regular debridements, applying mupirocin cream, and an Ace wrap for edema control. He returns to clinic for follow-up and is without complaints. No pain. No fever or chills. No drainage. 10/25/15; this is a 61 year old man who has type II diabetes with diabetic polyneuropathy. He tells me that he fractured his left fifth metatarsal in June 2016 when he presented with swelling. He does not recall a specific injury. His hemoglobin A1c was apparently too high at the time for consideration of surgery and he was put in some form of offloading. Ultimately he went to surgery in December with an allograft from his calcaneus to this site, plate and screws. He had an x-ray of the foot in March that showed concerns about nonunion. He tells me that in March he had to move and basically moved himself. He was on his foot a lot and then noticed some drainage from an open area. He has been following with his orthopedic surgeon Dr. Doran Durand. He has been applying a felt donut, dry dressing and using his heel healing sandal. 11/01/15; this is a patient I saw last week for the first time. He had a small open wound on the plantar aspect of his left foot at roughly the level of the base of his fifth metatarsal. He had a considerable degree of thickened skin around this wound on  the plantar aspect which I thought was from chronic pressure on this area. He tells [place] that he had drainage over the course of the week. No systemic symptoms. 11/08/15; culture last week grew Citrobacter korseri. This should've been sensitive to the Augmentin I gave him. He has seen Dr. Doran Durand who did his initial surgery and according the patient the plan is to give this another month and then the hardware might need to come out of this. This seems like a reasonable plan. I will adjust his antibiotics to ciprofloxacin which probably should continue for at least another 2 weeks. I gave him 10 days worth today 11/22/15 the patient has completed antibiotics. He has an appointment with Dr. Doran Durand this Friday. There is improved dimensions around the wound on the left fifth metatarsal base 11/29/15; the patient has completed antibiotics last week. Apparently his appointment with Dr. Doran Durand it is not until this Friday. Dimensions are roughly the same. 12/06/15; saw Dr. Doran Durand. No x-ray told the end of the month, next appointment June 30. We have been using Aquacel Ag 12/13/15: No major change this week. Using Aquacel AG 611/17; arrived this week with maceration around the wound. There was quite a bit of undermining which required surgical debridement. I changed him to [facility] last week, by the patient's admission he was up on this more this week 12/27/15; macerated tissue around the wound is removed with a scalpel and pickups. There is no undermining. Nonviable subcutaneous tissue and skin taken from the superior circumference of the wound is slough from the surface. READMISSION 03/06/16 since I last saw this patient at the end of June, he went for surgery on 01/11/16 by Dr. Doran Durand of orthopedics. He had a left foot irrigation and debridement, removal of hardware and placement of wound VAC.  He is also been followed by Dr. Megan Salon of infectious disease and completed a six-week course of IV Rocephin for group  B strep and Enterobacter in the bone at the time of surgery. Apparently at the time of surgery the bone looked healthy so I don't think any bone was actually removed. He has been using silver alginate based dressings on the same wound area at the base of the left fifth metatarsal on the left. I note that he is also had arterial studies on 01/08/16, these showed a left ABI of 1.2 to and a right ABI of 1.3. Waveforms were listed as biphasic. He was not felt to have any specific arterial issues. 03/13/16; no real change in the condition of the wound at the left lateral foot at roughly the base of his fifth metatarsal. Use silver alginate last week. 03/18/16 arrives today with no open area. Being suspicious of the overlying callus I. Some of this back although I see nothing but covering tissue here/epithelium. There is no surrounding tenderness READMISSION 04/16/16 this is a patient I discharged about a month ago. Initially a surgical wound on the lateral aspect of his left foot which subsequently became infected. The story he is giving today that he went back into his own modified shoe started to notice pain 2 weeks ago he was seen in Dr. Nona Dell office by a physician HUSAYN, REIM ([id number]) [id number] Page 8 of 11 assistant last Wednesday and according the patient was told that everything looked fine however this is clearly now broken open and he has an open wound in the same spot that we have been dealing with repetitively. Situation is complicated by the fact that he is running short of money on long-term disability. He has not taken his insulin and at least 2 weeks was previously on NovoLog short acting insulin on a sliding scale and TRESIBA 35 units at bedtime. He is no longer able to afford any of his medication he was in the x-ray on 10/15. A plain x-ray showed healed fracture of the left fifth metatarsal bone status post removal of the associated plate and  screw fixation hardware. There was no acute appearing osseous abnormality. His blood work showed a white count of 9.2 with an essentially normal differential comprehensive metabolic panel was normal. Previous CT scan of the foot on 01/08/16 showed no osteomyelitis previous vascular workup showed no evidence of significant PAD on 01/13/16 04/23/16; culture I did last week grew enterococcus [ampicillin sensitive] and MRSA. He saw infectious disease yesterday. They stop the clindamycin and ordered an MRI. This is not unreasonable. All the hardware is out of the foot at this point. 04/30/16 at this point in time we are still awaiting the results of the MRI at this point in time. Patient did have an area which appears to be somewhat macerated in the proximal portion of the wound where there is overlying necrotic skin that is doing nothing more than trapping fluid underneath. He continues to state that he does have some discomfort and again the concern is for the possibility of osteomyelitis hence the reason for the MRI order. We have been using a silver alginate dressing but again I think the reason this with his macerated as it was is that the dressing obscene could not reach the entirety of the wound due to some necrotic skin covering the proximal portion. No bleeding noted at this point in time on initial evaluation. 05/07/16 we receive the results of patient's MRI which shows  that he has no evidence of osteomyelitis. This obviously is excellent news. Patient was definitely happy to hear this. He tells me the wound appears to be doing very well at this point in time and is pleased with the progress currently. 05/15/16; as noted the patient did not have osteomyelitis. He has been released by infectious disease and orthopedics. His wound is still open he had a debridement last week but complain when he got home he "bleeding for half a day". He is not had any pain. We have been using silver alginate with  Kerlix gauze wrap. 05/28/16; patient arrives today with the wound in much the same condition as last time. He has a small opening on the lateral aspect of his foot however with debridement there is clear undermining medially there is no real evidence of infection here and I didn't see any point in culturing this. One would have to wonder if this isn't a simple matter of in adequate offloading as he is been using a healing sandal. 06/05/16; the open area is now on the plantar aspect of his foot and not decide. The wound almost appears to have "migrated". This was the term use by our intake nurse. 06/12/16; open area on the plantar aspect of his foot. Base of the wound looks very healthy. This will be his second week in a total contact cast 06/19/16; patient arrived today in a total contact cast. There was some expectation from our staff and myself that this area would be healed. Unfortunately the area was boggy and with rec pressure a fairly substantial amount of purulent drainage was obtained. Specimen obtained for culture. The patient had no complaints of systemic problems including fever or chills or instability of his diabetes. There was no pain in the foot. Nevertheless a extensive debridement was required. 06/26/16; patient's culture from the abscess last week grew a combination of MRSA and ampicillin sensitive enterococcus. I had him on Augmentin and Septra however I have elected to give him a full 10 day course of Zyvox instead as I Recently treated this combination of organisms with Augmentin and Septra before. Arrives today with no systemic symptoms 07/03/16; the patient has 2 more treatments of Zyvox and then he is finished antibiotics the wound has improved now mostly on the lateral aspect of his foot. There is still some tenderness when he walks. 07/10/16; patient arrives today with Zyvox completed. He only has a small open area remaining. 07/24/16; she arrives here with no overt open area.  The covering is thick callus/eschar. Nevertheless there is no open area here. He has some tenderness underneath the area but no overt infection is observed no drainage. The patient has a deformity in the foot with this area weekly to be exposed to more pressure in his foot where nevertheless that something we are going to have to deal with going forward. The patient has diabetic workboots and diabetic shoes. He has had a long difficult course with the area here. This started as a fracture at work. He had bone grafting from his calcaneus and screws. This got infected he had to have more surgery on the area. Bone at the time of that surgery I think showed enterococcus and group B strep. He had 6 weeks of Rocephin. Since then the area has waxed and waned in its difficulty. Recently he had an MRI in December that did not show osteomyelitis nevertheless he had an abscess that grew MRSA and enterococcus which I elected to treat with Zyvox.  This was in December and this wound is actually "healed" over 08/21/16; the patient came in today for his one-month follow-up visit. The area on the lateral aspect of his left foot looks much the same as some month ago. There is no evidence of an open wound here. However the patient tells me about a week after he went back to work he developed severe pain and swelling in the plantar aspect of his right foot first and fifth metatarsal heads. 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 He went to see his podiatrist at [facility]. She told him that he could not work on his feet. She told him to go back and his cam walking boot on the left. Not have open wounds obviously on the right. The patient is actually gone ahead and retired from his job because he does not feel he can work on his feet 09/18/16; patient comes back in saying he recently had some pain on the lateral aspect of his left foot.  Asked that we look at this. Other than that all of his wounds have a viable surface. He has diabetic shoes. He is retired from work. READMISSION Jonmichael Beadnell is a 61 year old man who we cared for for a prolonged period of time in late 2017 into March 2018. At that point he had suffered a fracture of his left fifth metatarsal at about the level of the base of the fifth metatarsal. He had surgical repair by Dr. Meryl Dare and he developed a very refractory wound over the fifth metatarsal. Ultimately this became infected he required hardware removal this eventually closed over and we discharged from the clinic in March of this year. He tells me as well until roughly 3 weeks ago when he developed increasing pain in the same area making it difficult for him to sleep. He was admitted to hospital from 9/5 through 9/7 with now an ulcer in the same area on the lateral aspect of his left foot at the level of the base of the metatarsal. X- rays and MRIs during this hospitalization were negative. Wound culture showed a combination of Escherichia coli, Morganella and Pseudomonas. He was given IV Cipro and vancomycin the hospital and then discharged home on Cipro and Doxy. He returned to the ER on 9/16 with leg swelling and discomfort. I don't think anything was added at that point although he did have an ultrasound of the left leg that was negative for DVT He was back in the ER on 10/4 again with . pain in the area he was given Bactrim but states he had a significant allergic reaction to that which has abated once he stopped the Bactrim. I don't think he had a full course. He went to see his podiatrist yesterday who recommended some form of foot soak. He has a Darco forefoot offloading boot The patient is a type II diabetic on insulin. T me his recent hemoglobin A1c was 7. Arterial studies done in July 2017 showed an ABI in the right of 1.3 on the ells left 1.2 to biphasic waveforms bilaterally. He was not felt to  have arterial disease. As noted the patient has had 2 MRIs most recently on 10/4 this showed no evidence of an abscess or osteomyelitis and no change since the prior study of 03/05/17 there was nonspecific edema on the dorsum of the foot. ABIs in this clinic today 1.2 which would be  unchanged from his study done in July/17 04/15/17; now down to 1 small wound on the left lateral foot. We wrapped him and applied collagen last week and the foot is fairly macerated. 04/22/17; small wound on the left lateral foot however it has some undermining. Using collagen 04/29/17; small wound on the left lateral foot some surrounding callus. Chronic damage in this area as a result of initial fracture nonhealing and secondary infection. 05/06/17; the patient arrives today with the area on the left lateral foot closed. There is thick subcutaneous tissue with a layer of callus. I took some of the callus off just to ensure adequate closure of the underlying tissue and there is no open wound here. He has considerable deformity of this area of his foot however and unless there is an ability to offload this I think opening is going to be recurrent. He tells me he has diabetic foot wear although I have not actually seen this Readmission: 09/02/17 on evaluation today patient appears to be doing a little better compared to what he tells me what's going on in regard to his left lateral foot. He was previously seen in the ER this past Saturday it is now Tuesday. On Saturday he actually was treated with antibiotics including Cipro 500 mg two times a day and doxycycline 100 mg two times a day. Subsequently he also did have an x-ray performed of his left foot which revealed increased degenerative changes of the base of the fifth metatarsal since comparison in 2018. There was no radiographic evidence of osteomyelitis however. Patient has previously had an MRI of the left foot which was performed on 04/03/17 in this revealed at that point  a soft tissue ulceration but no evidence of osteomyelitis. Patient has previously undergone testing in regard to his ABI's by Dr. Bridgett Larsson and this showed that he did have ABI was within normal limits which does correspond with ABI's we checked here in the office today. That study was on 01/13/16. With all that being said on evaluation today there does not appear to be any evidence of a true REILLEY, LATORRE ([id number]) [id number] Page 9 of 11 open wound of the left lateral fifth metatarsal region. He does have tenderness noted as well is a little bit of boggy/fluctuance feeling to the area in this region of his foot and there is definitely a very solid and firm callous noted laterally. With that being said I am not able to find any obvious opening at this point there does appear to be a spot where there appears to been a opening very recently however patient states he has not noted any drainage from this currently. He does however have discomfort with palpation of this area this is in the 3-4/10 range only with very firm palpation this is not too significant at all. Subsequently he does have a small ulcer at the medial nail bed of the right second toe where he inadvertently pulled off a portion of the toenail softly causing this injury. Otherwise there does not appear to be any evidence of infection in regard to this right second toe. 09/23/17 on evaluation today patient actually appears to be doing well he has no open ulcerations noted at this point. With that being said he did have significant callous noted over the left lateral foot which did require callous pairing today he tolerated this without complication. Readmission: 11/07/17 patient presents today for readmission concerning to ulcers that he has. One on the right plantar fifth metatarsal region  and the other on the left plantar fifth metatarsal region. He has previously had an x-ray which was performed on 11/05/17 and  revealed that the patient had wounds noted of the base of the fifth metatarsal region without evidence of destruction to the bone to suggest osteomyelitis. Obviously this is excellent news. With that being said he does have a significantly large ulcer that extensively plantar surface of the wound and is concerning for the fact that he continues to walk on this due to the patient telling me that he "has to work" I do believe this has likely led to both the opening of the wound as well as the fact that is not really able to heal very well at this point. He has been seen by his podiatrist and apparently according to the note dated 10/23/17 the patient was provided with Santyl and recommended a dry dressing. No wound culture has been obtained at this point. As stated above he has had returned to work due to financial reasons and is not able to wear his postop shoes due to the fact that he cannot wear this and work. The wound is stated to have been present for "several weeks" prior to the visit with the podiatrist on 10/23/17. With that being said again at least the good news is there does not appear to be any evidence of osteomyelitis. 11/14/17 on evaluation today patient appears to be doing poorly in regard to his bilateral foot ulcers. Unfortunately he seems to have significant callous with a lot of maceration underlined especially on the right even more so than the left. He has been tolerating the dressing changes without complication. With that being said overall I do think he's gonna have to have a significant debridement today. 11/21/17 on evaluation today patient presents for follow-up concerning his bilateral lateral foot ulcers. He has been tolerating the dressing changes without complication. His wife helps perform these for him. With that being said the patient states that the left lateral foot ulcer actually is continuing to give him trouble as far as discomfort is concerned. With that being said  actually appears better. 11/28/17 on evaluation today patient appears to be doing poorly in regard to his left lower extremity ulcer in particular. His right as made some progress on the lateral portion although the plantar portion is actually getting somewhat worse I think this is likely due to the fact that he continues to work although it hurts and he's been having a lot of issues out of this side and as far as pain is concerned. With that being said my biggest concern on evaluation today is that of the patient having issues with increased pain in the left lower extremity ulcer site especially on the plantar aspect. He also appears to have what seems to be an abscess developing in the dorsal/lateral portion of his foot which I think likely is communicating somewhere internally with the ulcer site. Nonetheless he has not had any fevers, chills, nausea, or vomiting noted at this time. He has however had fairly significant increase in pain and he was Artie having a lot of pain previous. Readmission: 01-29-2022 upon evaluation today patient presents for initial inspection here in our clinic concerning the current issue which has been present since around Oct 29, 2021. The patient tells me that he has at this point been seen by Dr. Vickki Muff who has been managing this up to this point. He has also been seen by Dr. Lazaro Arms I will outline the  findings there shortly as well. With that being said currently according to Dr. Alvera Singh note on 12-18-2021 the patient has a prescription for Zosyn that was given by infectious disease. Again that was back in June however. There was a hospital encounter for incision and drainage and debridement of the foot in order to clearway necrotic tissue. The patient also underwent vascular evaluation with Dr. Lucky Cowboy. Nonetheless on the postop visit on 12-18-2021 the patient was seeming to be doing quite well based on what Dr. Vickki Muff said but nonetheless since that time is really not made much  progress and is concerned about losing his foot. He therefore requested a referral to wound care to be seen and evaluated and see if there is anything we can do to help and aid with getting this to heal. Fortunately there does not appear to be any evidence of active infection locally or systemically at this time which is great news. He tells me that he is currently using Betadine wet-to-dry dressings. He does not note any systemic infections but does have evidence of local infection based on what we are seeing. He has had a recent chest x-ray which showed that he had no evidence of acute cardiopulmonary disease that was performed on 12-12-2021. He also underwent vascular intervention with Dr. 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 Subsequently the patient states that Dr. Lucky Cowboy has mentioned that he would need to go back at some time with Dr. Lucky Cowboy said he would consider a posterior tibial approach for revascularization in the future but at this time because of kidney status and the amount of dye used he did not want to proceed any further at that point. Again that something that would likely be scheduled down the road. Nonetheless right now the patient does have an ABI on the left of 0.90 with a TBI of 0.6-0 but this was decreased compared to previous. His ABI on the right was 1.08. Patient did have as well MRI of the left foot which was performed on 12-12-2021 and this shows that he does have interval resection of the proximal third of the fifth metatarsal and area of osteomyelitis on prior MRI. There is persistent ulceration of plantar lateral aspect at the resection site with a sinus tract extending towards the adjacent cuboid bone findings suspicious for osteomyelitis of the plantar lateral aspect of the cuboid and adjacent base of the  fourth metatarsal possibly in the proximal most aspect of the remaining fifth metatarsal shaft as well. The ABI performed on 12-13-2021 showed that the left ABI was 0.83 which had previously been registered at 1.35 and the right ABI was 1.20. I think this is consistent with being able to heal but obviously would do better with vascular intervention not something that is going to be looked into in the future. This was noted above as well. Based on what I am seeing today I do believe that the patient is definitely a candidate for HBO therapy I think this may be in his best interest as I think he is at the point of being in a limb threatening situation. I do believe that he would benefit from hyperbarics along with continued antibiotics along with good wound care measures as well. 02-04-2022 upon evaluation today patient appears to be doing still poorly in regard to his  foot he is having a lot of drainage and he notes odor as well. With that being said we still have not heard anything back from insurance yet as far as approval for HBO therapy. Subsequently the patient does have evidence of osteomyelitis and I think hyperbarics is definitely going to be something that would be beneficial for him. He has been on antibiotics several times previous but unfortunately this just does not seem to be sustained enough we did do a culture it showed Pseudomonas as one of the primary organisms both organisms would be treated with Levaquin which I think is going to be the best way to go. I discussed that with the patient today and I Georgina Peer send that into the pharmacy. 02-11-2022 upon evaluation today patient appears to be doing well currently in regard to his wound. I do feel like the Levaquin is doing better for him which is great news. Fortunately I do not see any evidence of active infection locally or systemically at this time which is great news. No fevers, chills, nausea, vomiting, or diarrhea. 02-19-2022 upon  evaluation today patient appears to be doing excellent in regard to his foot ulcer all things considered he still has bone exposed but the good news is we are at least making some progress here I think he does have a lot of odor he tells me and I think switching over to Dakin's moistened gauze dressing is probably can be the way to go. 02-26-2022 upon evaluation today patient appears to be doing well currently in regard to his wound all things considered. Although the 1 thing I do see is that he is not changing the dressing as frequently as he should be. Fortunately there does not appear to be any signs of active infection he tells me his wife is out of town from Saturday through today so it has not been changed since Saturday before that they have been doing this every other day. I explained that with the Dakin's he needs to do this every day in order to make sure that it heals appropriately. This will also help with the odor which she has been complaining of. 03-11-2022 upon evaluation today patient appears to be doing better overall visually in regard to his wound. He is tolerating the dressing changes without complication. Fortunately there does not appear to be any evidence of active infection locally or systemically at this time. No fevers, chills, nausea, vomiting, or diarrhea. 03-19-2022 upon evaluation today patient appears to be doing decently well in regard to his wound. He is actually tolerating dressing changes without Complication. Fortunately there does not appear to be any signs of active infection locally or systemically at this time which is great news. No fevers, chills, nausea, vomiting, or diarrhea. ANDROS, CHANNING ([id number]) [id number] Page 10 of 11 04-02-2022 upon evaluation today patient's wound is actually showing signs of significant improvement I am actually very pleased with where we stand today. I do not see any evidence of infection locally or  systemically at this time which is great news. 04-08-2022 upon evaluation today patient actually appears to be making good progress. I am very pleased with where things stand his wound is smaller he seems to be tolerating hyperbarics well he is doing well with wound care and in general I think we are making excellent headway here. I do believe that he would likely benefit from extending the hyperbarics. I think that he is making such good progress I do not want to see  anything backtrack on him at this point. Overall the benefit coupled with the antibiotics and an excellent wound care that we have been working with him with and that his wife is continuing to do at home I think he is making a great impact in getting this healed to prevent limb loss. 04-16-2022 upon evaluation today patient appears to be doing excellent with the [facility] dressing. Coupled with hyperbarics he is managing quite nicely and healing quite nicely. Fortunately I do not see any evidence of active infection locally or systemically which is great news and overall I am extremely pleased with where we stand today. No fevers, chills, nausea, vomiting, or diarrhea. 04-23-2022 upon evaluation today patient appears to be doing well currently in regard to his wound for the most part although there is some area of rubbing on the lateral aspect as well as the top of his foot. When questioned he actually has been more active over the weekend he tells me that it was his birthday and he had some friends over. Subsequently I think this is what led to things being somewhat worse compared to what they were previous. Fortunately I do not see any evidence of active infection locally or systemically at this time which is great news. No fevers, chills, nausea, vomiting, or diarrhea. We are still waiting on the approval for continuation of HBO therapy. 04-30-2022 upon evaluation today patient's wound is actually showing signs of improvement. He  continues to utilize the knee scooter which I think is doing well for him. The volume continues to improve the size got a little worse last week due to the fact that he was up on this more. The good news is he is actually back down to more normal situation at this point today. There is some need for sharp debridement. 11/7 patient was seen in conjunction with HBO which she is tolerating well. HBO dive #47 today. Wound is on the left plantar foot on the lateral aspect. He has been making excellent progress. Objective Constitutional Vitals Time Taken: 11:02 AM, Height: 70 in, Weight: 255 lbs, BMI: 36.6, Temperature: 97.8 F, Pulse: 84 bpm, Respiratory Rate: 16 breaths/min, Blood Pressure: 122/74 mmHg. Integumentary (Hair, Skin) Wound #14 status is Open. Original cause of wound was Blister. The date acquired was: 10/29/2021. The wound has been in treatment 14 weeks. The wound is located on the Left,Lateral Foot. The wound measures 4.4cm length x 2cm width x 0.3cm depth; 6.912cm^2 area and 2.073cm^3 volume. There is muscle and Fat Layer (Subcutaneous Tissue) exposed. There is no tunneling or undermining noted. There is a medium amount of serosanguineous drainage noted. The wound margin is epibole. There is large (67-100%) red granulation within the wound bed. There is a small (1-33%) amount of necrotic tissue within the wound bed including Adherent Slough. Assessment Active Problems ICD-10 Chronic multifocal osteomyelitis, left ankle and foot Type 2 diabetes mellitus with foot ulcer Non-pressure chronic ulcer of other part of left foot with necrosis of bone Disruption of external operation (surgical) wound, not elsewhere classified, initial encounter Plan Follow-up Appointments: Return Appointment in 1 week. Bathing/ Shower/ Hygiene: May shower with wound dressing protected with water repellent cover or cast protector. Anesthetic (Use 'Patient Medications' Section for Anesthetic Order  Entry): Lidocaine applied to wound bed Edema Control - Lymphedema / Segmental Compressive Device / Other: Elevate, Exercise Daily and Avoid Standing for Long Periods of Time. Elevate legs to the level of the heart and pump ankles as often as possible Elevate  leg(s) parallel to the floor when sitting. Off-Loading: Open toe surgical shoe Other: - knee scooter Additional Orders / Instructions: Other: - need daily dressing changes Hyperbaric Oxygen Therapy: Wound #14 Left,Lateral Foot: Indication and location: - Left foot osteomyelitis If appropriate for treatment, begin HBOT per protocol: LEILAND, MIHELICH ([id number]) [id number] Page 11 of 11 2.0 ATA for 90 Minutes without Air Breaks One treatment per day (delivered Monday through Friday unless otherwise specified in Special Instructions below): T # of Treatments: - 40 additional 40 requested for a total of 80 otal Antihistamine 30 minutes prior to HBO Treatment, difficulty clearing ears. Finger stick Blood Glucose Pre- and Post- HBOT Treatment. Follow Hyperbaric Oxygen Glycemia Protocol WOUND #14: - Foot Wound Laterality: Left, Lateral Cleanser: Byram Ancillary Kit - 15 Day Supply (Generic) 3 x Per Week/30 Days Discharge Instructions: Use supplies as instructed; Kit contains: (15) Saline Bullets; (15) 3x3 Gauze; 15 pr Gloves Cleanser: Wound Cleanser 3 x Per Week/30 Days Discharge Instructions: Wash your hands with soap and water. Remove old dressing, discard into plastic bag and place into trash. Cleanse the wound with Wound Cleanser prior to applying a clean dressing using gauze sponges, not tissues or cotton balls. Do not scrub or use excessive force. Pat dry using gauze sponges, not tissue or cotton balls. Prim Dressing: Hydrofera Blue Ready Transfer Foam, 4x5 (in/in) (Dispense As Written) 3 x Per Week/30 Days ary Discharge Instructions: Apply Hydrofera Blue Ready to wound bed as directed Secondary  Dressing: ABD Pad 5x9 (in/in) (Generic) 3 x Per Week/30 Days Discharge Instructions: Cover with ABD pad Secured With: Medipore T - 61M Medipore H Soft Cloth Surgical T ape ape, 2x2 (in/yd) (Generic) 3 x Per Week/30 Days Secured With: [employer] Sterile or Non-Sterile 6-ply 4.5x4 (yd/yd) (Generic) 3 x Per Week/30 Days Discharge Instructions: Apply Kerlix as directed 1. Really nice improvement looking back through oh 1 months worth of photographs and I healed. 2. No change to the primary dressing which the patient is doing himself this is Hydrofera Blue ABDs 3. The edges thickened callused but no debridement done today as long he is he is continuing to improving terms of the wound dimensions, no debridement may be necessary. 4. Continue HBO. He is really tolerated this well and appears to be getting good benefit from treatment Electronic Signature(s) Signed: 05/07/2022 5:01:28 PM By: [name] MD Entered By: [name] on 05/07/2022 11:49:49 -------------------------------------------------------------------------------- SuperBill Details Patient Name: Date of Service: Maryfrances Bunnell, Lamonte Sakai RO LD F. 05/07/2022 Medical Record Number: [id number] Patient Account Number: [id number] Date of Birth/Sex: Treating RN: 12-10-1960 (61 y.o. [name] Primary Care Provider: [name] Other Clinician: Massie Kluver Referring Provider: Treating Provider/Extender: [name], [name] in Treatment: 14 Diagnosis Coding ICD-10 Codes Code Description [phone] Chronic multifocal osteomyelitis, left ankle and foot E11.621 Type 2 diabetes mellitus with foot ulcer L97.524 Non-pressure chronic ulcer of other part of left foot with necrosis of bone T81.31XA Disruption of external operation (surgical) wound, not elsewhere classified, initial encounter Facility Procedures : CPT4 Code: [id number] Description: Sims VISIT-LEV 3 EST PT Modifier: Quantity: 1 Electronic  Signature(s) Signed: 05/07/2022 5:01:28 PM By: [name] MD Entered By: [name] on 05/07/2022 12:18:02

## 2022-05-08 NOTE — Progress Notes (Addendum)
ALDRED, MASE ([id number]) [id number] Page 1 of 2 Visit Report for 05/08/2022 HBO Details Patient Name: Date of Service: [name] RO LD F. 05/08/2022 8:00 A M Medical Record Number: [id number] Patient Account Number: [id number] Date of Birth/Sex: Treating RN: [birth year] (61 y.o. 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 HBO Treatment Details Treatment Number: 48 Patient Type: Outpatient Chamber Type: Monoplace Chamber Serial #: [id number] Treatment Protocol: 2.0 ATA with 90 minutes oxygen, and no air breaks Treatment Details Compression Rate Down: 1.5 psi / minute De-Compression Rate Up: 1.5 psi / minute Air breaks and breathing Decompress Decompress Compress Tx Pressure Begins Reached periods Begins Ends (leave unused spaces blank) Chamber Pressure (ATA 1 2 ------2 1 ) Clock Time (24 hr) 08:50 09:00 - - - - - - 10:30 10:41 Treatment Length: 111 (minutes) Treatment Segments: 4 Vital Signs Capillary Blood Glucose Reference Range: 80 - 120 mg / dl HBO Diabetic Blood Glucose Intervention Range: <131 mg/dl or >249 mg/dl Type: Time Vitals Blood Pulse: Respiratory Temperature: Capillary Blood Glucose Pulse Action Taken: Pressure: Rate: Glucose (mg/dl): Meter #: Oximetry (%) Taken: Pre 08:06 120/64 72 16 97.9 128 1 Gave Ensure and rechecked; DO notified Pre 08:27 125 1 Gave 2nd Ensure; DO notified Pre 08:48 175 1 DO notified; proceed with HBO Treatment Response Treatment Toleration: Well Treatment Completion Status: Treatment Completed without Adverse Event HBO Attestation I certify  that I supervised this HBO treatment in accordance with Medicare guidelines. A trained emergency response team is readily available per Yes hospital policies and procedures. Continue HBOT as ordered. Yes Electronic Signature(s) Signed: 05/08/2022 3:55:12 PM By: Kalman Shan DO Previous Signature: 05/08/2022 11:52:39 AM Version By: [name] RCP, RRT CHT , , Previous Signature: 05/08/2022 2:02:44 PM Version By: Florentina Addison ([id number]) [id number] Page 2 of 2 Previous Signature: 05/08/2022 2:02:44 PM Version By: Kalman Shan DO Entered By: Kalman Shan on 05/08/2022 14:03:22 -------------------------------------------------------------------------------- HBO Safety Checklist Details Patient Name: Date of Service: [name] RO LD F. 05/08/2022 8:00 A M Medical Record Number: [id number] Patient Account Number: [id number] Date of Birth/Sex: Treating RN: Mar 26, 1961 (61 y.o. [name], [name] Primary Care [name] Other Clinician: [name] Referring [name]: Treating [name] in Treatment: 14 HBO Safety Checklist Items Safety Checklist Consent Form Signed Patient voided / foley secured and emptied When did you last eato 05/07/22 Last dose of injectable or oral agent 05/07/22 Ostomy pouch emptied and vented if applicable NA All implantable devices assessed, documented and approved NA Intravenous access site secured and place NA Valuables secured Linens and cotton and cotton/polyester blend (less than 51% polyester) Personal oil-based products / skin lotions / body lotions removed Wigs or hairpieces removed NA Smoking or tobacco materials removed NA Books / newspapers / magazines / loose paper removed NA Cologne, aftershave, perfume and deodorant removed Jewelry removed (may wrap wedding band) Make-up removed NA Hair care products  removed Battery operated devices (external) removed NA Heating patches and chemical warmers removed NA Titanium eyewear removed NA Nail polish cured greater than 10 hours NA Casting material cured greater than 10 hours NA Hearing aids removed NA Loose dentures or partials removed NA Prosthetics  have been removed NA Patient demonstrates correct use of air break device (if applicable) Patient concerns have been addressed Patient grounding bracelet on and cord attached to chamber Specifics for Inpatients (complete in addition to above) Medication sheet sent with patient Intravenous medications needed or due during therapy sent with patient Drainage tubes (e.g. nasogastric tube or chest tube secured and vented) Endotracheal or Tracheotomy tube secured Cuff deflated of air and inflated with saline Airway suctioned Electronic Signature(s) Signed: 05/08/2022 11:52:39 AM By: [name] RCP, RRT CHT , , Entered By: [name] on 05/08/2022 09:29:22 ,

## 2022-05-08 NOTE — Progress Notes (Signed)
[name] ([id number]) [id number] Page 1 of 2 Visit Report for 05/08/2022 Arrival Information Details Patient Name: Date of Service: [name] RO LD F. 05/08/2022 8:00 A M Medical Record Number: [id number] Patient Account Number: [id number] Date of Birth/Sex: Treating RN: 1961-03-27 (61 y.o. [name], [name] Primary Care Elim Economou: [name] Other Clinician: [name] Referring Lynnae Ludemann: Treating Naviah Belfield/Extender: [name] in Treatment: 38 Visit Information History Since Last Visit Added or deleted any medications: No Patient Arrived: Knee Scooter Any new allergies or adverse reactions: No Arrival Time: 08:00 Had a fall or experienced change in No Accompanied By: self activities of daily living that may affect Transfer Assistance: None risk of falls: Patient Identification Verified: Yes Signs or symptoms of abuse/neglect since No Secondary Verification Process Completed: Yes last visito Patient Requires Transmission-Based Precautions: No Hospitalized since last visit: No Patient Has Alerts: No Implantable device outside of the clinic No excluding cellular tissue based products placed in the center since last visit: Has Dressing in Place as Prescribed: Yes Has Footwear/Offloading in Place as Yes Prescribed: Left: Surgical Shoe with Pressure Relief Insole Pain Present Now: No Electronic Signature(s) Signed: 05/08/2022 11:52:39 AM By: [name] RCP, RRT CHT , , Entered By: [name] on 05/08/2022 09:27:24 , -------------------------------------------------------------------------------- Encounter Discharge Information Details Patient Name: Date of Service: [name] RO LD F. 05/08/2022 8:00 A M Medical Record Number: [id number] Patient Account Number: [id number] Date of Birth/Sex: Treating RN: 12-Apr-1961 (61 y.o. [name], [name] Primary Care Ren Grasse: [name] Other  Clinician: [name] Referring Sumiko Ceasar: Treating Donzel Romack/Extender: [name] in Treatment: 14 Encounter Discharge Information Items Discharge Condition: Stable Ambulatory Status: Knee Scooter Discharge Destination: Home [name], [name] ([id number]) [id number] Page 2 of 2 Transportation: Private Auto Accompanied By: self Schedule Follow-up Appointment: Yes Clinical Summary of Care: Notes Patient has an HBO treatment scheduled on 05/09/22 at 08:00 am. Electronic Signature(s) Signed: 05/08/2022 11:52:39 AM By: [name] RCP, RRT CHT , , Entered By: [name] on 05/08/2022 11:48:02 , -------------------------------------------------------------------------------- Vitals Details Patient Name: Date of Service: [name] RO LD F. 05/08/2022 8:00 A M Medical Record Number: [id number] Patient Account Number: [id number] Date of Birth/Sex: Treating RN: [birth year] (61 y.o. [name], [name] Primary Care Arun Herrod: [name] Other Clinician: [name] Referring Adilson Grafton: Treating Tee Richeson/Extender: [name] in Treatment: 14 Vital Signs Time Taken: 08:06 Temperature (F): 97.9 Height (in): 70 Pulse (bpm): 72 Weight (lbs): 255 Respiratory Rate (breaths/min): 16 Body Mass Index (BMI): 36.6 Blood Pressure (mmHg): 120/64 Capillary Blood Glucose (mg/dl): 128 Reference Range: 80 - 120 mg / dl Electronic Signature(s) Signed: 05/08/2022 11:52:39 AM By: [name] RCP, RRT CHT , , Entered By: [name] on 05/08/2022 09:28:05 ,

## 2022-05-08 NOTE — Progress Notes (Signed)
ALFONSE, GARRINGER ([id number]) [id number] Page 1 of 10 Visit Report for 05/07/2022 Arrival Information Details Patient Name: Date of Service: [name] RO LD F. 05/07/2022 10:30 A M Medical Record Number: [id number] Patient Account Number: [id number] Date of Birth/Sex: Treating RN: 1960/07/04 (61 y.o. [name], [name] Primary Care Serine Kea: [name] Other Clinician: Massie Kluver Referring Jodiann Ognibene: Treating Darreld Hoffer/Extender: [name], [name] [name], [name] in Treatment: 14 Visit Information History Since Last Visit All ordered tests and consults were completed: No Patient Arrived: Knee Scooter Added or deleted any medications: No Arrival Time: 10:58 Any new allergies or adverse reactions: No Transfer Assistance: None Had a fall or experienced change in No Patient Requires Transmission-Based Precautions: No activities of daily living that may affect Patient Has Alerts: No risk of falls: Signs or symptoms of abuse/neglect since last visito No Hospitalized since last visit: No Implantable device outside of the clinic excluding No cellular tissue based products placed in the center since last visit: Pain Present Now: Yes Electronic Signature(s) Signed: 05/07/2022 5:15:48 PM By: Massie Kluver Entered By: Massie Kluver on 05/07/2022 11:01:25 -------------------------------------------------------------------------------- Clinic Level of Care Assessment Details Patient Name: Date of Service: [name] RO LD F. 05/07/2022 10:30 A M Medical Record Number: [id number] Patient Account Number: [id number] Date of Birth/Sex: Treating RN: Dec 08, 1960 (61 y.o. [name] Primary Care Jaculin Rasmus: [name] Other Clinician: Massie Kluver Referring Keenan Trefry: Treating Domini Vandehei/Extender: RO BSO N, [name] in Treatment: 14 Clinic Level of Care Assessment Items TOOL 4 Quantity Score _0  - 0 Use when only an EandM is  performed on FOLLOW-UP visit ASSESSMENTS - Nursing Assessment / Reassessment X- 1 10 Reassessment of Co-morbidities (includes updates in patient status) X- 1 5 Reassessment of Adherence to Treatment Plan JANI, PLOEGER ([id number]) [id number] Page 2 of 10 ASSESSMENTS - Wound and Skin A ssessment / Reassessment X - Simple Wound Assessment / Reassessment - one wound 1 5 _1  - 0 Complex Wound Assessment / Reassessment - multiple wounds _2  - 0 Dermatologic / Skin Assessment (not related to wound area) ASSESSMENTS - Focused Assessment _3  - 0 Circumferential Edema Measurements - multi extremities _4  - 0 Nutritional Assessment / Counseling / Intervention _5  - 0 Lower Extremity Assessment (monofilament, tuning fork, pulses) _6  - 0 Peripheral Arterial Disease Assessment (using hand held doppler) ASSESSMENTS - Ostomy and/or Continence Assessment and Care _7  - 0 Incontinence Assessment and Management _8  - 0 Ostomy Care Assessment and Management (repouching, etc.) PROCESS - Coordination of Care X - Simple Patient / Family Education for ongoing care 1 15 _9  - 0 Complex (extensive) Patient / Family Education for ongoing care _10  - 0 Staff obtains Programmer, systems, Records, T Results / Process Orders est _11  - 0 Staff telephones HHA, Nursing Homes / Clarify orders / etc _12  - 0 Routine Transfer to another Facility (non-emergent condition) _13  - 0 Routine Hospital Admission (non-emergent condition) _14  - 0 New Admissions / Biomedical engineer / Ordering NPWT Apligraf, etc. , _15  - 0 Emergency Hospital Admission (emergent condition) X- 1 10 Simple Discharge Coordination _16  - 0 Complex (extensive) Discharge Coordination PROCESS - Special Needs _17  - 0 Pediatric / Minor Patient Management _18  - 0 Isolation Patient Management _19  - 0 Hearing / Language / Visual special needs _20  - 0 Assessment of Community assistance (transportation, D/C planning, etc.) _21  -  0 Additional assistance / Altered mentation _22  - 0 Support Surface(s) Assessment (bed, cushion, seat, etc.) INTERVENTIONS - Wound Cleansing /  Measurement X - Simple Wound Cleansing - one wound 1 5 _0  - 0 Complex Wound Cleansing - multiple wounds X- 1 5 Wound Imaging (photographs - any number of wounds) _1  - 0 Wound Tracing (instead of photographs) X- 1 5 Simple Wound Measurement - one wound _2  - 0 Complex Wound Measurement - multiple wounds INTERVENTIONS - Wound Dressings _3  - 0 Small Wound Dressing one or multiple wounds X- 1 15 Medium Wound Dressing one or multiple wounds _4  - 0 Large Wound Dressing one or multiple wounds <YOVZCHYIFOYDXAJO>_8<\/NOMVEHMCNOBSJGGE>_3  - 0 Application of Medications - topical <MOQHUTMLYYTKPTWS>_5<\/KCLEXNTZGYFVCBSW>_9  - 0 Application of Medications - injection INTERVENTIONS - Miscellaneous _7  - 0 External ear exam KANNON, GRANDERSON ([id number]) [id number] Page 3 of 10 _8  - 0 Specimen Collection (cultures, biopsies, blood, body fluids, etc.) _9  - 0 Specimen(s) / Culture(s) sent or taken to Lab for analysis _10  - 0 Patient Transfer (multiple staff / Harrel Lemon Lift / Similar devices) _11  - 0 Simple Staple / Suture removal (25 or less) _12  - 0 Complex Staple / Suture removal (26 or more) _13  - 0 Hypo / Hyperglycemic Management (close monitor of Blood Glucose) _14  - 0 Ankle / Brachial Index (ABI) - do not check if billed separately X- 1 5 Vital Signs Has the patient been seen at the hospital within the last three years: Yes Total Score: 80 Level Of Care: New/Established - Level 3 Electronic Signature(s) Signed: 05/07/2022 5:15:48 PM By: Massie Kluver Entered By: Massie Kluver on 05/07/2022 11:20:40 -------------------------------------------------------------------------------- Encounter Discharge Information Details Patient Name: Date of Service: [name] RO LD F. 05/07/2022 10:30 A M Medical Record Number: [id number] Patient Account Number: [id number] Date of Birth/Sex: Treating RN: 04/14/1961  (61 y.o. [name] Primary Care Aissata Wilmore: [name] Other Clinician: Massie Kluver Referring Candice Lunney: Treating Arjen Deringer/Extender: RO BSO N, [name] [name], [name] in Treatment: 14 Encounter Discharge Information Items Discharge Condition: Stable Ambulatory Status: Knee Scooter Discharge Destination: Home Transportation: Private Auto Accompanied By: self Schedule Follow-up Appointment: Yes Clinical Summary of Care: Electronic Signature(s) Signed: 05/07/2022 5:15:48 PM By: Massie Kluver Entered By: Massie Kluver on 05/07/2022 11:30:12 -------------------------------------------------------------------------------- Lower Extremity Assessment Details Patient Name: Date of Service: [name] RO LD F. 05/07/2022 10:30 A Radene Journey ([id number]) [id number] Page 4 of 10 Medical Record Number: [id number] Patient Account Number: [id number] Date of Birth/Sex: Treating RN: 11/18/1960 (61 y.o. [name] Primary Care Daisa Stennis: [name] Other Clinician: Massie Kluver Referring Carolan Avedisian: Treating Stephanye Finnicum/Extender: [name], [name] in Treatment: 14 Electronic Signature(s) Signed: 05/07/2022 5:15:48 PM By: Massie Kluver Signed: 05/07/2022 5:24:18 PM By: Gretta Cool, BSN, RN, CWS, Kim RN, BSN Entered By: Massie Kluver on 05/07/2022 11:09:34 -------------------------------------------------------------------------------- Multi Wound Chart Details Patient Name: Date of Service: [name] RO LD F. 05/07/2022 10:30 A M Medical Record Number: [id number] Patient Account Number: [id number] Date of Birth/Sex: Treating RN: [birth year] (62 y.o. [name] Primary Care Mariana Goytia: [name] Other Clinician: Massie Kluver Referring Josef Tourigny: Treating Altan Kraai/Extender: RO BSO N, [name] [name], [name] in Treatment: 14 Vital Signs Height(in): 70 Pulse(bpm): 24 Weight(lbs): 255 Blood Pressure(mmHg):  122/74 Body Mass Index(BMI): 36.6 Temperature(F): 97.8 Respiratory Rate(breaths/min): 16 [14:Photos:] [N/A:N/A] Left, Lateral Foot N/A N/A Wound Location: Blister N/A N/A Wounding Event: Diabetic Wound/Ulcer of the Lower N/A N/A Primary Etiology: Extremity Dehisced Wound N/A N/A Secondary Etiology: Hypertension, Type II Diabetes, N/A N/A Comorbid History: Neuropathy 10/29/2021 N/A N/A Date Acquired: 14 N/A N/A Weeks of  Treatment: Open N/A N/A Wound Status: No N/A N/A Wound Recurrence: Yes N/A N/A Pending A mputation on Presentation: 4.4x2x0.3 N/A N/A Measurements L x W x D (cm) 6.912 N/A N/A A (cm) : rea 2.073 N/A N/A Volume (cm) : 36.00% N/A N/A % Reduction in A rea: 92.30% N/A N/A % Reduction in Volume: Grade 3 N/A N/A Classification: Medium N/A N/A Exudate A mount: Serosanguineous N/A N/A Exudate Type: red, brown N/A N/A Exudate Color: Epibole N/A N/A Wound Margin: Large (67-100%) N/A N/A Granulation A mount: Red N/A N/A Granulation QualityBURNETTE, SAUTTER ([id number]) [id number] Page 5 of 10 Small (1-33%) N/A N/A Necrotic Amount: Fat Layer (Subcutaneous Tissue): Yes N/A N/A Exposed Structures: Muscle: Yes Fascia: No Tendon: No Joint: No Bone: No Small (1-33%) N/A N/A Epithelialization: Treatment Notes Electronic Signature(s) Signed: 05/07/2022 5:15:48 PM By: Massie Kluver Entered By: Massie Kluver on 05/07/2022 11:09:58 -------------------------------------------------------------------------------- [place] Details Patient Name: Date of Service: [name] RO LD F. 05/07/2022 10:30 A M Medical Record Number: [id number] Patient Account Number: [id number] Date of Birth/Sex: Treating RN: Mar 26, 1961 (61 y.o. [name], [name] Primary Care Tequila Rottmann: [name] Other Clinician: Massie Kluver Referring Fawnda Vitullo: Treating Haniah Penny/Extender: RO BSO N, [name] [name], [name] in  Treatment: 14 Active Inactive HBO Nursing Diagnoses: Anxiety related to feelings of confinement associated with the hyperbaric oxygen chamber Anxiety related to knowledge deficit of hyperbaric oxygen therapy and treatment procedures Discomfort related to temperature and humidity changes inside hyperbaric chamber Potential for barotraumas to ears, sinuses, teeth, and lungs or cerebral gas embolism related to changes in atmospheric pressure inside hyperbaric oxygen chamber Potential for oxygen toxicity seizures related to delivery of 100% oxygen at an increased atmospheric pressure Potential for pulmonary oxygen toxicity related to delivery of 100% oxygen at an increased atmospheric pressure Goals: Barotrauma will be prevented during HBO2 Date Initiated: 02/11/2022 T arget Resolution Date: 02/11/2022 Goal Status: Active Patient and/or family will be able to state/discuss factors appropriate to the management of their disease process during treatment Date Initiated: 02/11/2022 T arget Resolution Date: 02/11/2022 Goal Status: Active Patient will tolerate the hyperbaric oxygen therapy treatment Date Initiated: 02/11/2022 T arget Resolution Date: 02/11/2022 Goal Status: Active Patient will tolerate the internal climate of the chamber Date Initiated: 02/11/2022 T arget Resolution Date: 02/12/2022 Goal Status: Active Patient/caregiver will verbalize understanding of HBO goals, rationale, procedures and potential hazards Date Initiated: 02/11/2022 T arget Resolution Date: 02/11/2022 Goal Status: Active Signs and symptoms of pulmonary oxygen toxicity will be recognized and promptly addressed Date Initiated: 02/11/2022 T arget Resolution Date: 02/11/2022 Goal Status: Active Signs and symptoms of seizure will be recognized and promptly addressed ; seizing patients will suffer no harm Date Initiated: 02/11/2022 T arget Resolution Date: 02/11/2022 Goal Status: Active [name], MOSE ([id number])  [id number] Page 6 of 10 Interventions: Administer a five (5) minute air break for patient if signs and symptoms of seizure appear and notify the hyperbaric physician Administer decongestants, per physician orders, prior to HBO2 Administer the correct therapeutic gas delivery based on the patients needs and limitations, per physician order Assess and provide for patients comfort related to the hyperbaric environment and equalization of middle ear Assess for signs and symptoms related to adverse events, including but not limited to confinement anxiety, pneumothorax, oxygen toxicity and baurotrauma Assess patient for any history of confinement anxiety Assess patient's knowledge and expectations regarding hyperbaric medicine and provide education related to the hyperbaric environment, goals of treatment and prevention of adverse events  Implement protocols to decrease risk of pneumothorax in high risk patients Notes: Wound/Skin Impairment Nursing Diagnoses: Knowledge deficit related to ulceration/compromised skin integrity Goals: Patient/caregiver will verbalize understanding of skin care regimen Date Initiated: 01/29/2022 Target Resolution Date: 03/01/2022 Goal Status: Active Ulcer/skin breakdown will have a volume reduction of 30% by week 4 Date Initiated: 01/29/2022 Date Inactivated: 04/16/2022 Target Resolution Date: 03/01/2022 Goal Status: Unmet Unmet Reason: comorbidties Ulcer/skin breakdown will have a volume reduction of 50% by week 8 Date Initiated: 01/29/2022 Date Inactivated: 04/16/2022 Target Resolution Date: 03/31/2022 Goal Status: Unmet Unmet Reason: comorbidties Ulcer/skin breakdown will have a volume reduction of 80% by week 12 Date Initiated: 01/29/2022 Target Resolution Date: 05/01/2022 Goal Status: Active Ulcer/skin breakdown will heal within 14 weeks Date Initiated: 01/29/2022 Target Resolution Date: 05/01/2022 Goal Status:  Active Interventions: Assess patient/caregiver ability to obtain necessary supplies Assess patient/caregiver ability to perform ulcer/skin care regimen upon admission and as needed Assess ulceration(s) every visit Notes: Electronic Signature(s) Signed: 05/07/2022 5:15:48 PM By: Massie Kluver Signed: 05/07/2022 5:24:18 PM By: Gretta Cool, BSN, RN, CWS, Kim RN, BSN Entered By: Massie Kluver on 05/07/2022 11:09:50 -------------------------------------------------------------------------------- Pain Assessment Details Patient Name: Date of Service: [name] RO LD F. 05/07/2022 10:30 A M Medical Record Number: [id number] Patient Account Number: [id number] Date of Birth/Sex: Treating RN: February 03, 1961 (61 y.o. [name] Primary Care Camaron Cammack: [name] Other Clinician: Massie Kluver Referring Diandre Merica: Treating Alfa Leibensperger/Extender: RO BSO N, [name] [name], [name] in Treatment: 14 Active Problems Location of Pain Severity and Description of Pain Patient Has CAINAN, TRULL ([id number]) [id number] Page 7 of 10 Patient Has Paino Yes Site Locations Pain Location: Pain in Ulcers Duration of the Pain. Constant / Intermittento Intermittent Rate the pain. Current Pain Level: 3 Worst Pain Level: 4 Least Pain Level: 2 Tolerable Pain Level: 2 Character of Pain Describe the Pain: Sharp, Shooting, Stabbing Pain Management and Medication Current Pain Management: Medication: Yes Cold Application: No Rest: Yes Massage: No Activity: No T.E.N.S.: No Heat Application: No Leg drop or elevation: No Is the Current Pain Management Adequate: Inadequate How does your wound impact your activities of daily livingo Sleep: No Bathing: No Appetite: No Relationship With Others: No Bladder Continence: No Emotions: No Bowel Continence: No Work: No Toileting: No Drive: No Dressing: No Hobbies: No Electronic Signature(s) Signed: 05/07/2022 5:15:48  PM By: Massie Kluver Signed: 05/07/2022 5:24:18 PM By: Gretta Cool, BSN, RN, CWS, Kim RN, BSN Entered By: Massie Kluver on 05/07/2022 11:03:24 -------------------------------------------------------------------------------- Patient/Caregiver Education Details Patient Name: Date of Service: [name] RO LD F. 11/7/2023andnbsp10:30 A M Medical Record Number: [id number] Patient Account Number: [id number] Date of Birth/Gender: Treating RN: 1961-04-12 (61 y.o. [name] Primary Care Physician: [name] Other Clinician: Massie Kluver Referring Physician: Treating Physician/Extender: [name], [name] [name] [name] in Treatment: 14 Education Assessment Education Provided To: Patient Education Topics Provided Wound/Skin ImpairmentSHAH, INSLEY ([id number]) [id number] Page 8 of 10 Handouts: Other: continue wound care as directed Methods: Explain/Verbal Responses: State content correctly Electronic Signature(s) Signed: 05/07/2022 5:15:48 PM By: Massie Kluver Entered By: Massie Kluver on 05/07/2022 11:21:10 -------------------------------------------------------------------------------- Wound Assessment Details Patient Name: Date of Service: [name] RO LD F. 05/07/2022 10:30 A M Medical Record Number: [id number] Patient Account Number: [id number] Date of Birth/Sex: Treating RN: [birth year] (61 y.o. [name] Primary Care [name] [name]: [name] Other Clinician: Massie Kluver Referring Dayona Shaheen: Treating Gilmer Kaminsky/Extender: RO BSO N, [name] [name], [name] in Treatment:  14 Wound Status Wound Number: 14 Primary Etiology: Diabetic Wound/Ulcer of the Lower Extremity Wound Location: Left, Lateral Foot Secondary Etiology: Dehisced Wound Wounding Event: Blister Wound Status: Open Date Acquired: 10/29/2021 Comorbid History: Hypertension, Type II Diabetes, Neuropathy Weeks Of Treatment: 14 Clustered Wound: No Pending  Amputation On Presentation Photos Wound Measurements Length: (cm) 4.4 Width: (cm) 2 Depth: (cm) 0.3 Area: (cm) 6.912 Volume: (cm) 2.073 % Reduction in Area: 36% % Reduction in Volume: 92.3% Epithelialization: Small (1-33%) Tunneling: No Undermining: No Wound Description Classification: Grade 3 Wound Margin: Epibole Exudate Amount: Medium Exudate Type: Serosanguineous Exudate Color: red, brown Foul Odor After Cleansing: No Slough/Fibrino Yes Wound Bed Granulation Amount: Large (67-100%) Exposed Structure Granulation Quality: Red Fascia Exposed: No Necrotic Amount: Small (1-33%) Fat Layer (Subcutaneous Tissue) ExposedPIERCE, BAROCIO ([id number]) [id number] Page 9 of 10 Necrotic Quality: Adherent Slough Tendon Exposed: No Muscle Exposed: Yes Necrosis of Muscle: No Joint Exposed: No Bone Exposed: No Treatment Notes Wound #14 (Foot) Wound Laterality: Left, Lateral Cleanser [place] Ancillary Kit - 15 Day Supply Discharge Instruction: Use supplies as instructed; Kit contains: (15) Saline Bullets; (15) 3x3 Gauze; 15 pr Gloves Wound Cleanser Discharge Instruction: Wash your hands with soap and water. Remove old dressing, discard into plastic bag and place into trash. Cleanse the wound with Wound Cleanser prior to applying a clean dressing using gauze sponges, not tissues or cotton balls. Do not scrub or use excessive force. Pat dry using gauze sponges, not tissue or cotton balls. Peri-Wound Care Topical Primary Dressing Hydrofera Blue Ready Transfer Foam, 4x5 (in/in) Discharge Instruction: Apply Hydrofera Blue Ready to wound bed as directed Secondary Dressing ABD Pad 5x9 (in/in) Discharge Instruction: Cover with ABD pad Secured With [place] H Soft Cloth Surgical T ape ape, 2x2 (in/yd) Kerlix Roll Sterile or Non-Sterile 6-ply 4.5x4 (yd/yd) Discharge Instruction: Apply Kerlix as directed Compression Wrap Compression  Stockings Add-Ons Electronic Signature(s) Signed: 05/07/2022 5:15:48 PM By: Massie Kluver Signed: 05/07/2022 5:24:18 PM By: Gretta Cool, BSN, RN, CWS, Kim RN, BSN Entered By: Massie Kluver on 05/07/2022 11:09:23 -------------------------------------------------------------------------------- Vitals Details Patient Name: Date of Service: Maryfrances Bunnell, Lamonte Sakai RO LD F. 05/07/2022 10:30 A M Medical Record Number: [id number] Patient Account Number: [id number] Date of Birth/Sex: Treating RN: 12-20-1960 (61 y.o. [name], [name] Primary Care Teeghan Hammer: [name] Other Clinician: Massie Kluver Referring Sanai Frick: Treating Petrice Beedy/Extender: RO BSO N, [name] [name], [name] in Treatment: 14 Vital Signs Time Taken: 11:02 Temperature (F): 97.8 Height (in): 70 Pulse (bpm): 84 Weight (lbs): 255 Respiratory Rate (breaths/min): 16 Body Mass Index (BMI): 36.6 Blood Pressure (mmHg): 122/74 Reference Range: 80 - 120 mg / dl ALMUS, WOODHAM ([id number]) [id number] Page 10 of 10 Electronic Signature(s) Signed: 05/07/2022 5:15:48 PM By: Massie Kluver Entered By: Massie Kluver on 05/07/2022 11:03:19

## 2022-05-08 NOTE — Progress Notes (Signed)
[name] ([id number]) [id number] Page 1 of 2 Visit Report for 05/07/2022 HBO Details Patient Name: Date of Service: [name] RO LD F. 05/07/2022 8:00 A M Medical Record Number: [id number] Patient Account Number: [id number] Date of Birth/Sex: Treating RN: September 16, 1960 (61 y.o. [name], [name] Primary Care [name]: [name] Other Clinician: Jacqulyn [name] Referring [name]: Treating [name]: [name], [name] Novella Rob in Treatment: 14 HBO Treatment Course Details Treatment Course Number: 1 Ordering [name]: [name] T Treatments Ordered: otal 60 HBO Treatment Start Date: 02/18/2022 HBO Indication: Chronic Refractory Osteomyelitis to Left Ankle and Foot HBO Treatment Details Treatment Number: 47 Patient Type: Outpatient Chamber Type: Monoplace Chamber Serial #: [id number] Treatment Protocol: 2.0 ATA with 90 minutes oxygen, and no air breaks Treatment Details Compression Rate Down: 1.5 psi / minute De-Compression Rate Up: 1.5 psi / minute Air breaks and breathing Decompress Decompress Compress Tx Pressure Begins Reached periods Begins Ends (leave unused spaces blank) Chamber Pressure (ATA 1 2 ------2 1 ) Clock Time (24 hr) 08:14 08:24 - - - - - - 09:54 10:04 Treatment Length: 110 (minutes) Treatment Segments: 4 Vital Signs Capillary Blood Glucose Reference Range: 80 - 120 mg / dl HBO Diabetic Blood Glucose Intervention Range: <131 mg/dl or >249 mg/dl Type: Time Vitals Blood Respiratory Capillary Blood Glucose Pulse Action Pulse: Temperature: Taken: Pressure: Rate: Glucose (mg/dl): Meter #: Oximetry (%) Taken: Pre 08:00 118/64 72 16 98 182 1 none per protocol Post 10:17 122/74 84 16 97.8 201 1 none per protocol Treatment Response Treatment Toleration: Well Treatment Completion Status: Treatment Completed without Adverse Event [name] Notes No concerns with treatment given. Patient was also seen for wound  care evaluation on the left foot HBO Attestation I certify that I supervised this HBO treatment in accordance with Medicare guidelines. A trained emergency response team is readily available per Yes hospital policies and procedures. Continue HBOT as ordered. [address] [name] ([id number]) [id number] Page 2 of 2 Signed: 05/07/2022 5:01:28 PM By: [name] Previous Signature: 05/07/2022 10:56:29 AM Version By: [name] RCP, RRT CHT , , Entered By: [name] on 05/07/2022 16:52:19 -------------------------------------------------------------------------------- HBO Safety Checklist Details Patient Name: Date of Service: [name] RO LD F. 05/07/2022 8:00 A M Medical Record Number: [id number] Patient Account Number: [id number] Date of Birth/Sex: Treating RN: 21-May-1961 (61 y.o. [name], [name] Primary Care [name]: [name] Other Clinician: Jacqulyn [name] Referring [name]: Treating [name]: [name], [name] [name], [name] in Treatment: 14 HBO Safety Checklist Items Safety Checklist Consent Form Signed Patient voided / foley secured and emptied When did you last eato 07:00 am Last dose of injectable or oral agent 05/06/22 pm Ostomy pouch emptied and vented if applicable NA All implantable devices assessed, documented and approved NA Intravenous access site secured and place NA Valuables secured Linens and cotton and cotton/polyester blend (less than 51% polyester) Personal oil-based products / skin lotions / body lotions removed Wigs or hairpieces removed NA Smoking or tobacco materials removed NA Books / newspapers / magazines / loose paper removed NA Cologne, aftershave, perfume and deodorant removed Jewelry removed (may wrap wedding band) Make-up removed NA Hair care products removed Battery operated devices (external) removed NA Heating patches and chemical warmers  removed NA Titanium eyewear removed NA Nail polish cured greater than 10 hours NA Casting material cured greater than 10 hours NA Hearing aids removed NA Loose dentures or partials removed NA Prosthetics have  been removed NA Patient demonstrates correct use of air break device (if applicable) Patient concerns have been addressed Patient grounding bracelet on and cord attached to chamber Specifics for Inpatients (complete in addition to above) Medication sheet sent with patient Intravenous medications needed or due during therapy sent with patient Drainage tubes (e.g. nasogastric tube or chest tube secured and vented) Endotracheal or Tracheotomy tube secured Cuff deflated of air and inflated with saline Airway suctioned Electronic Signature(s) Signed: 05/07/2022 10:56:29 AM By: [name] RCP, RRT CHT , , Entered By: [name] on 05/07/2022 08:58:27 ,

## 2022-05-09 ENCOUNTER — Encounter: Payer: 59 | Admitting: Internal Medicine

## 2022-05-09 DIAGNOSIS — M86372 Chronic multifocal osteomyelitis, left ankle and foot: Secondary | ICD-10-CM | POA: Diagnosis not present

## 2022-05-09 LAB — GLUCOSE, CAPILLARY
Glucose-Capillary: 188 mg/dL — ABNORMAL HIGH (ref 70–99)
Glucose-Capillary: 208 mg/dL — ABNORMAL HIGH (ref 70–99)

## 2022-05-09 NOTE — Progress Notes (Addendum)
MILFERD, ANSELL ([id number]) [id number] Page 1 of 3 Visit Report for 05/09/2022 HBO Details Patient Name: Date of Service: [name] RO LD F. 05/09/2022 8:00 A M Medical Record Number: [id number] Patient Account Number: [id number] Date of Birth/Sex: Treating RN: [name], [name] (61 y.o. [name], [name] Primary Care Comfort Iversen: [name] Other Clinician: [name] Referring Lorian Yaun: Treating [name]/Extender: [name], [name] in Treatment: 14 HBO Treatment Course Details Treatment Course Number: 1 Ordering Averey Koning: [name] T Treatments Ordered: otal 60 HBO Treatment Start Date: 02/18/2022 HBO Indication: Chronic Refractory Osteomyelitis to Left Ankle and Foot HBO Treatment Details Treatment Number: 49 Patient Type: Outpatient Chamber Type: Monoplace Chamber Serial #: [id number] Treatment Protocol: 2.0 ATA with 90 minutes oxygen, and no air breaks Treatment Details Compression Rate Down: 1.5 psi / minute De-Compression Rate Up: 1.5 psi / minute Air breaks and breathing Decompress Decompress Compress Tx Pressure Begins Reached periods Begins Ends (leave unused spaces blank) Chamber Pressure (ATA 1 2 ------2 1 ) Clock Time (24 hr) 08:19 08:28 - - - - - - 09:59 10:10 Treatment Length: 111 (minutes) Treatment Segments: 4 Vital Signs Capillary Blood Glucose Reference Range: 80 - 120 mg / dl HBO Diabetic Blood Glucose Intervention Range: <131 mg/dl or >249 mg/dl Type: Time Vitals Blood Respiratory Capillary Blood Glucose Pulse Action Pulse: Temperature: Taken: Pressure: Rate: Glucose (mg/dl): Meter #: Oximetry (%) Taken: Pre 08:15 108/78 68 16 98.1 188 started treatment Post 10:15 117/78 68 16 98 208 Treatment Response Treatment Toleration: Well Treatment Completion Status: Treatment Completed without Adverse Event Ariyel Jeangilles Notes No concerns with treatment given HBO Attestation I certify that I supervised this HBO  treatment in accordance with Medicare guidelines. A trained emergency response team is readily available per Yes hospital policies and procedures. Continue HBOT as ordered. [address] CASTLE, LAMONS ([id number]) [id number] Page 2 of 3 Signed: 05/09/2022 3:56:39 PM By: [name] MD Previous Signature: 05/09/2022 11:12:13 AM Version By: Gretta Cool BSN, RN, CWS, Kim RN, BSN Previous Signature: 05/09/2022 10:09:51 AM Version By: Gretta Cool, BSN, RN, CWS, Kim RN, BSN Previous Signature: 05/09/2022 10:08:00 AM Version By: Gretta Cool BSN, RN, CWS, Kim RN, BSN Previous Signature: 05/09/2022 8:32:29 AM Version By: Gretta Cool, BSN, RN, CWS, Kim RN, BSN Entered By: [name] on 05/09/2022 15:43:28 -------------------------------------------------------------------------------- HBO Safety Checklist Details Patient Name: Date of Service: [name] RO LD F. 05/09/2022 8:00 A M Medical Record Number: [id number] Patient Account Number: [id number] Date of Birth/Sex: Treating RN: [birth year] (61 y.o. [name], [name] Primary Care Idan Prime: [name] Other Clinician: [name] Referring Hibba Schram: Treating Marlena Barbato/Extender: [name], [name] [name], [name] in Treatment: 14 HBO Safety Checklist Items Safety Checklist Consent Form Signed Patient voided / foley secured and emptied When did you last eato am Last dose of injectable or oral agent last night Ostomy pouch emptied and vented if applicable NA All implantable devices assessed, documented and approved NA Intravenous access site secured and place NA Valuables secured Linens and cotton and cotton/polyester blend (less than 51% polyester) Personal oil-based products / skin lotions / body lotions removed Wigs or hairpieces removed NA Smoking or tobacco materials removed NA Books / newspapers / magazines / loose paper removed NA Cologne, aftershave, perfume and deodorant removed Jewelry removed  ([name] wrap wedding band) NA Make-up removed NA Hair care products removed Battery operated devices (external) removed NA Heating patches and chemical warmers removed NA Titanium eyewear removed NA Nail polish cured greater than  10 hours NA Casting material cured greater than 10 hours NA Hearing aids removed NA Loose dentures or partials removed NA Prosthetics have been removed NA Patient demonstrates correct use of air break device (if applicable) Patient concerns have been addressed Patient grounding bracelet on and cord attached to chamber Specifics for Inpatients (complete in addition to above) Medication sheet sent with patient Intravenous medications needed or due during therapy sent with patient Drainage tubes (e.g. nasogastric tube or chest tube secured and vented) Endotracheal or Tracheotomy tube secured Cuff deflated of air and inflated with saline Airway suctioned Electronic Signature(s) Signed: 05/09/2022 8:31:45 AM By: Gretta Cool, BSN, RN, CWS, Kim RN, BSN Entered By: Gretta Cool, BSN, RN, CWS, Kim on 05/09/2022 08:31:44 [name] ([id number]) [id number] Page 3 of 3

## 2022-05-09 NOTE — Progress Notes (Addendum)
EDU, ON ([id number]) [id number] Page 1 of 2 Visit Report for 05/09/2022 Arrival Information Details Patient Name: Date of Service: [name] RO LD F. 05/09/2022 8:00 A M Medical Record Number: [id number] Patient Account Number: [id number] Date of Birth/Sex: Treating RN: September 27, 1960 (61 y.o. [name] Primary Care Daysha Ashmore: [name] Other Clinician: [name] Referring Orena Cavazos: Treating Daxtyn Rottenberg/Extender: [name], [name] in Treatment: 14 Visit Information History Since Last Visit Added or deleted any medications: No Patient Arrived: Knee Scooter Has Dressing in Place as Prescribed: Yes Arrival Time: 08:10 Pain Present Now: No Accompanied By: self Transfer Assistance: None Patient Identification Verified: Yes Secondary Verification Process Completed: Yes Patient Requires Transmission-Based Precautions: No Patient Has Alerts: No Electronic Signature(s) Signed: 05/09/2022 8:30:20 AM By: Gretta Cool, BSN, RN, CWS, Kim RN, BSN Entered By: Gretta Cool, BSN, RN, CWS, Kim on 05/09/2022 08:30:20 -------------------------------------------------------------------------------- Encounter Discharge Information Details Patient Name: Date of Service: [name] RO LD F. 05/09/2022 8:00 A M Medical Record Number: [id number] Patient Account Number: [id number] Date of Birth/Sex: Treating RN: [birth year] (61 y.o. [name], [name] Primary Care Miyani Cronic: [name] Other Clinician: [name] Referring Verba Ainley: Treating Jacorion Klem/Extender: [name], [name] in Treatment: 14 Encounter Discharge Information Items Discharge Condition: Stable Ambulatory Status: Knee Scooter Discharge Destination: Home Transportation: Private Auto Accompanied By: self Schedule Follow-up Appointment: Yes Clinical Summary of Care: Electronic Signature(s) Signed: 05/09/2022 11:12:55 AM By: Gretta Cool, BSN, RN, CWS, Kim RN,  BSN [address], [place] F ([id number]) [id number] Page 2 of 2 Entered By: Gretta Cool, BSN, RN, CWS, Kim on 05/09/2022 11:12:55 -------------------------------------------------------------------------------- Vitals Details Patient Name: Date of Service: [name] RO LD F. 05/09/2022 8:00 A M Medical Record Number: [id number] Patient Account Number: [id number] Date of Birth/Sex: Treating RN: 20-Feb-1961 (61 y.o. [name], [name] Primary Care Dennise Bamber: [name] Other Clinician: [name] Referring Chitara Clonch: Treating Haleemah Buckalew/Extender: [name], [name] [name], [name] in Treatment: 14 Vital Signs Time Taken: 08:15 Temperature (F): 98.1 Height (in): 70 Pulse (bpm): 68 Weight (lbs): 255 Respiratory Rate (breaths/min): 16 Body Mass Index (BMI): 36.6 Blood Pressure (mmHg): 108/78 Capillary Blood Glucose (mg/dl): 188 Reference Range: 80 - 120 mg / dl Electronic Signature(s) Signed: 05/09/2022 8:30:54 AM By: Gretta Cool, BSN, RN, CWS, Kim RN, BSN Previous Signature: 05/09/2022 8:30:49 AM Version By: Gretta Cool, BSN, RN, CWS, Kim RN, BSN Entered By: Gretta Cool, BSN, RN, CWS, Kim on 05/09/2022 08:30:54

## 2022-05-10 ENCOUNTER — Encounter: Payer: 59 | Admitting: Internal Medicine

## 2022-05-10 DIAGNOSIS — M86372 Chronic multifocal osteomyelitis, left ankle and foot: Secondary | ICD-10-CM | POA: Diagnosis not present

## 2022-05-10 LAB — GLUCOSE, CAPILLARY
Glucose-Capillary: 255 mg/dL — ABNORMAL HIGH (ref 70–99)
Glucose-Capillary: 135 mg/dL — ABNORMAL HIGH (ref 70–99)

## 2022-05-10 NOTE — Progress Notes (Signed)
JEWELL, RYANS ([id number]) [id number] Page 1 of 2 Visit Report for 05/10/2022 Arrival Information Details Patient Name: Date of Service: [name] RO LD F. 05/10/2022 8:00 A M Medical Record Number: [id number] Patient Account Number: [id number] Date of Birth/Sex: Treating RN: 1960/11/20 (61 y.o. [name] Primary Care Marytza Grandpre: [name] Other Clinician: [name] Referring Eythan Jayne: Treating Miyanna Wiersma/Extender: [name], [name] in Treatment: 14 Visit Information History Since Last Visit Added or deleted any medications: No Patient Arrived: Knee Scooter Any new allergies or adverse reactions: No Arrival Time: 08:30 Had a fall or experienced change in No Accompanied By: self activities of daily living that may affect Transfer Assistance: None risk of falls: Patient Identification Verified: Yes Signs or symptoms of abuse/neglect since No Secondary Verification Process Completed: Yes last visito Patient Requires Transmission-Based Precautions: No Hospitalized since last visit: No Patient Has Alerts: No Implantable device outside of the clinic No excluding cellular tissue based products placed in the center since last visit: Has Dressing in Place as Prescribed: Yes Has Footwear/Offloading in Place as Yes Prescribed: Left: Surgical Shoe with Pressure Relief Insole Pain Present Now: No Electronic Signature(s) Signed: 05/10/2022 12:20:23 PM By: [name] RCP, RRT CHT , , Entered By: [name] on 05/10/2022 10:24:48 , -------------------------------------------------------------------------------- Encounter Discharge Information Details Patient Name: Date of Service: [name] RO LD F. 05/10/2022 8:00 A M Medical Record Number: [id number] Patient Account Number: [id number] Date of Birth/Sex: Treating RN: 08-16-1960 (61 y.o. [name] Primary Care Morine Kohlman: [name] Other Clinician: [name] Referring Darwyn Ponzo: Treating Tannon Peerson/Extender: [name], [name] in Treatment: 14 Encounter Discharge Information Items Discharge Condition: Stable Ambulatory Status: Knee Scooter Discharge Destination: Home [name], [name] ([id number]) [id number] Page 2 of 2 Transportation: Private Auto Accompanied By: self Schedule Follow-up Appointment: Yes Clinical Summary of Care: Notes Patient has an HBO treatment scheduled on 05/13/22 at 08:00 am. Electronic Signature(s) Signed: 05/10/2022 12:20:23 PM By: [name] RCP, RRT CHT , , Entered By: [name] on 05/10/2022 12:20:01 , -------------------------------------------------------------------------------- Vitals Details Patient Name: Date of Service: [name] RO LD F. 05/10/2022 8:00 A M Medical Record Number: [id number] Patient Account Number: [id number] Date of Birth/Sex: Treating RN: [birth year] (61 y.o. [name], [name] Primary Care Mckensie Scotti: [name] Other Clinician: [name] Referring Irene Mitcham: Treating [name]/Extender: [name], [name] [name], [name] in Treatment: 14 Vital Signs Time Taken: 08:36 Temperature (F): 97.8 Height (in): 70 Pulse (bpm): 66 Weight (lbs): 255 Respiratory Rate (breaths/min): 16 Body Mass Index (BMI): 36.6 Blood Pressure (mmHg): 118/70 Capillary Blood Glucose (mg/dl): 255 Reference Range: 80 - 120 mg / dl Electronic Signature(s) Signed: 05/10/2022 12:20:23 PM By: [name] RCP, RRT CHT , , Entered By: [name] on 05/10/2022 10:25:42 ,

## 2022-05-10 NOTE — Progress Notes (Signed)
ERMINE, SPOFFORD ([id number]) [id number] Page 1 of 2 Visit Report for 05/10/2022 HBO Details Patient Name: Date of Service: [name] RO LD F. 05/10/2022 8:00 A M Medical Record Number: [id number] Patient Account Number: [id number] Date of Birth/Sex: Treating RN: 1961/02/17 (61 y.o. [name], [name] Primary Care [name]: [name] Other Clinician: [name] Referring Savon Cobbs: Treating [name]/Extender: [name], [name] in Treatment: 14 HBO Treatment Course Details Treatment Course Number: 1 Ordering Jamiaya Bina: [name] T Treatments Ordered: otal 60 HBO Treatment Start Date: 02/18/2022 HBO Indication: Chronic Refractory Osteomyelitis to Left Ankle and Foot HBO Treatment Details Treatment Number: 50 Patient Type: Outpatient Chamber Type: Monoplace Chamber Serial #: [id number] Treatment Protocol: 2.0 ATA with 90 minutes oxygen, and no air breaks Treatment Details Compression Rate Down: 1.5 psi / minute De-Compression Rate Up: 2.0 psi / minute Air breaks and breathing Decompress Decompress Compress Tx Pressure Begins Reached periods Begins Ends (leave unused spaces blank) Chamber Pressure (ATA 1 2 ------2 1 ) Clock Time (24 hr) 08:54 09:04 - - - - - - 10:34 10:43 Treatment Length: 109 (minutes) Treatment Segments: 4 Vital Signs Capillary Blood Glucose Reference Range: 80 - 120 mg / dl HBO Diabetic Blood Glucose Intervention Range: <131 mg/dl or >249 mg/dl Type: Time Vitals Blood Respiratory Capillary Blood Glucose Pulse Action Pulse: Temperature: Taken: Pressure: Rate: Glucose (mg/dl): Meter #: Oximetry (%) Taken: Pre 08:36 118/70 66 16 97.8 255 1 none per protocol Post 10:55 118/64 72 16 97.8 135 1 none per protocol Treatment Response Treatment Toleration: Well Treatment Completion Status: Treatment Completed without Adverse Event Sanaai Doane Notes No concerns with treatment given HBO Attestation I certify  that I supervised this HBO treatment in accordance with Medicare guidelines. A trained emergency response team is readily available per Yes hospital policies and procedures. Continue HBOT as ordered. [address] EDDER, BELLANCA ([id number]) [id number] Page 2 of 2 Signed: 05/10/2022 12:57:19 PM By: [name] MD Previous Signature: 05/10/2022 12:20:23 PM Version By: [name] RCP, RRT CHT , , Entered By: [name] on 05/10/2022 12:56:33 -------------------------------------------------------------------------------- HBO Safety Checklist Details Patient Name: Date of Service: [name] RO LD F. 05/10/2022 8:00 A M Medical Record Number: [id number] Patient Account Number: [id number] Date of Birth/Sex: Treating RN: [birth year] (61 y.o. [name], [name] Primary Care [name]: [name] Other Clinician: [name] Referring Wayne Wicklund: Treating [name]/Extender: [name], [name] in Treatment: 14 HBO Safety Checklist Items Safety Checklist Consent Form Signed Patient voided / foley secured and emptied When did you last eato 05/09/22 pm Last dose of injectable or oral agent 05/09/22 pm Ostomy pouch emptied and vented if applicable NA All implantable devices assessed, documented and approved NA Intravenous access site secured and place NA Valuables secured Linens and cotton and cotton/polyester blend (less than 51% polyester) Personal oil-based products / skin lotions / body lotions removed Wigs or hairpieces removed NA Smoking or tobacco materials removed NA Books / newspapers / magazines / loose paper removed NA Cologne, aftershave, perfume and deodorant removed Jewelry removed (may wrap wedding band) Make-up removed NA Hair care products removed Battery operated devices (external) removed NA Heating patches and chemical warmers removed NA Titanium eyewear removed NA Nail polish  cured greater than 10 hours NA Casting material cured greater than 10 hours NA Hearing aids removed NA Loose dentures or partials removed NA Prosthetics have been removed NA Patient demonstrates correct use of air break device (if  applicable) Patient concerns have been addressed Patient grounding bracelet on and cord attached to chamber Specifics for Inpatients (complete in addition to above) Medication sheet sent with patient Intravenous medications needed or due during therapy sent with patient Drainage tubes (e.g. nasogastric tube or chest tube secured and vented) Endotracheal or Tracheotomy tube secured Cuff deflated of air and inflated with saline Airway suctioned Electronic Signature(s) Signed: 05/10/2022 12:20:23 PM By: [name] RCP, RRT CHT , , Entered By: [name] on 05/10/2022 10:27:36 ,

## 2022-05-13 ENCOUNTER — Encounter: Payer: 59 | Admitting: Physician Assistant

## 2022-05-13 DIAGNOSIS — M86372 Chronic multifocal osteomyelitis, left ankle and foot: Secondary | ICD-10-CM | POA: Diagnosis not present

## 2022-05-13 LAB — GLUCOSE, CAPILLARY
Glucose-Capillary: 243 mg/dL — ABNORMAL HIGH (ref 70–99)
Glucose-Capillary: 165 mg/dL — ABNORMAL HIGH (ref 70–99)

## 2022-05-13 NOTE — Progress Notes (Signed)
DAUNTE, OESTREICH ([id number]) [id number] Page 1 of 2 Visit Report for 05/13/2022 Arrival Information Details Patient Name: Date of Service: [name] RO LD F. 05/13/2022 8:00 A M Medical Record Number: [id number] Patient Account Number: [id number] Date of Birth/Sex: Treating RN: 06-Dec-1960 (61 y.o. [name], [name] Primary Care [name]: [name] Other Clinician: Jacqulyn [name] Referring [name]: Treating [name]/Extender: [name] in Treatment: 10 Visit Information History Since Last Visit Added or deleted any medications: No Patient Arrived: Knee Scooter Any new allergies or adverse reactions: No Arrival Time: 08:00 Had a fall or experienced change in No Accompanied By: self activities of daily living that may affect Transfer Assistance: None risk of falls: Patient Identification Verified: Yes Signs or symptoms of abuse/neglect since No Secondary Verification Process Completed: Yes last visito Patient Requires Transmission-Based Precautions: No Hospitalized since last visit: No Patient Has Alerts: No Implantable device outside of the clinic No excluding cellular tissue based products placed in the center since last visit: Has Dressing in Place as Prescribed: Yes Has Footwear/Offloading in Place as Yes Prescribed: Left: Surgical Shoe with Pressure Relief Insole Pain Present Now: No Electronic Signature(s) Signed: 05/13/2022 11:44:07 AM By: [name] RCP, RRT CHT , , Entered By: [name] on 05/13/2022 09:46:04 , -------------------------------------------------------------------------------- Encounter Discharge Information Details Patient Name: Date of Service: [name] RO LD F. 05/13/2022 8:00 A M Medical Record Number: [id number] Patient Account Number: [id number] Date of Birth/Sex: Treating RN: Feb 25, 1961 (61 y.o. [name], [name] Primary Care [name]: [name] Other  Clinician: Jacqulyn [name] Referring [name]: Treating [name]/Extender: [name] in Treatment: [phone] Encounter Discharge Information Items Discharge Condition: Stable Ambulatory Status: Knee Scooter Discharge Destination: Home [name], [name] ([id number]) [id number] Page 2 of 2 Transportation: Private Auto Accompanied By: self Schedule Follow-up Appointment: Yes Clinical Summary of Care: Notes Patient has an HBO treatment scheduled on 05/14/22 at 08:00 am. Electronic Signature(s) Signed: 05/13/2022 11:44:07 AM By: [name] RCP, RRT CHT , , Entered By: [name] on 05/13/2022 11:43:37 , -------------------------------------------------------------------------------- Vitals Details Patient Name: Date of Service: [name] RO LD F. 05/13/2022 8:00 A M Medical Record Number: [id number] Patient Account Number: [id number] Date of Birth/Sex: Treating RN: [birth year] (61 y.o. [name], [name] Primary Care [name]: [name] Other Clinician: Jacqulyn [name] Referring [name]: Treating [name]/Extender: [name] in Treatment: 14 Vital Signs Time Taken: 08:14 Temperature (F): 97.9 Height (in): 70 Pulse (bpm): 78 Weight (lbs): 255 Respiratory Rate (breaths/min): 16 Body Mass Index (BMI): 36.6 Blood Pressure (mmHg): 116/76 Capillary Blood Glucose (mg/dl): 243 Reference Range: 80 - 120 mg / dl Electronic Signature(s) Signed: 05/13/2022 11:44:07 AM By: [name] RCP, RRT CHT , , Entered By: [name] on 05/13/2022 09:46:39 ,

## 2022-05-14 ENCOUNTER — Encounter: Payer: 59 | Admitting: Physician Assistant

## 2022-05-14 DIAGNOSIS — M86372 Chronic multifocal osteomyelitis, left ankle and foot: Secondary | ICD-10-CM | POA: Diagnosis not present

## 2022-05-14 LAB — GLUCOSE, CAPILLARY
Glucose-Capillary: 217 mg/dL — ABNORMAL HIGH (ref 70–99)
Glucose-Capillary: 159 mg/dL — ABNORMAL HIGH (ref 70–99)

## 2022-05-14 NOTE — Progress Notes (Signed)
[name] ([id number]) [id number] Page 1 of 2 Visit Report for 05/14/2022 Arrival Information Details Patient Name: Date of Service: [name] RO LD F. 05/14/2022 8:00 A M Medical Record Number: [id number] Patient Account Number: [id number] Date of Birth/Sex: Treating RN: 09/08/1960 (61 y.o. [name], [name] Primary Care Male Minish: [name] Other Clinician: Jacqulyn [name] Referring [name]: Treating [name] in Treatment: 15 Visit Information History Since Last Visit Added or deleted any medications: No Patient Arrived: Knee Scooter Any new allergies or adverse reactions: No Arrival Time: 08:00 Had a fall or experienced change in No Accompanied By: self activities of daily living that may affect Transfer Assistance: None risk of falls: Patient Identification Verified: Yes Signs or symptoms of abuse/neglect since No Secondary Verification Process Completed: Yes last visito Patient Requires Transmission-Based Precautions: No Hospitalized since last visit: No Patient Has Alerts: No Implantable device outside of the clinic No excluding cellular tissue based products placed in the center since last visit: Has Dressing in Place as Prescribed: Yes Has Footwear/Offloading in Place as Yes Prescribed: Left: Surgical Shoe with Pressure Relief Insole Pain Present Now: No Electronic Signature(s) Signed: 05/14/2022 11:23:18 AM By: [name] RCP, RRT CHT , , Entered By: [name] on 05/14/2022 08:24:42 , -------------------------------------------------------------------------------- Encounter Discharge Information Details Patient Name: Date of Service: [name] RO LD F. 05/14/2022 8:00 A M Medical Record Number: [id number] Patient Account Number: [id number] Date of Birth/Sex: Treating RN: 02-05-1961 (61 y.o. [name], [name] Primary Care [name]: [name] Other  Clinician: Jacqulyn [name] Referring [name]: Treating [name]/Extender: [name] in Treatment: 15 Encounter Discharge Information Items Discharge Condition: Stable Ambulatory Status: Knee Scooter Discharge Destination: Home [name] ([id number]) [id number] Page 2 of 2 Transportation: Private Auto Accompanied By: self Schedule Follow-up Appointment: Yes Clinical Summary of Care: Notes Patient has an HBO treatment scheduled on 05/15/22 at 08:00 am. Electronic Signature(s) Signed: 05/14/2022 11:23:18 AM By: [name] RCP, RRT CHT , , Entered By: [name] on 05/14/2022 11:22:51 , -------------------------------------------------------------------------------- Vitals Details Patient Name: Date of Service: [name] RO LD F. 05/14/2022 8:00 A M Medical Record Number: [id number] Patient Account Number: [id number] Date of Birth/Sex: Treating RN: 15-Oct-1960 (61 y.o. [name], [name] Primary Care [name]: [name] Other Clinician: Jacqulyn [name] Referring Auriana Scalia: Treating [name]/Extender: [name] in Treatment: 15 Vital Signs Time Taken: 08:03 Temperature (F): 98.2 Height (in): 70 Pulse (bpm): 78 Weight (lbs): 255 Respiratory Rate (breaths/min): 16 Body Mass Index (BMI): 36.6 Blood Pressure (mmHg): 118/70 Capillary Blood Glucose (mg/dl): 217 Reference Range: 80 - 120 mg / dl Electronic Signature(s) Signed: 05/14/2022 11:23:18 AM By: [name] RCP, RRT CHT , , Entered By: [name] on 05/14/2022 08:26:27 ,

## 2022-05-14 NOTE — Progress Notes (Addendum)
SHARVIL, HOEY ([id number]) [id number] Page 1 of 14 Visit Report for 05/14/2022 Chief Complaint Document Details Patient Name: Date of Service: [name] RO LD F. 05/14/2022 10:30 A M Medical Record Number: [id number] Patient Account Number: [id number] Date of Birth/Sex: Treating RN: June 17, 1961 (61 y.o. [name] Primary Care Provider: [name] Other Clinician: Massie Kluver Referring Provider: Treating Provider/Extender: [name] in Treatment: 15 Information Obtained from: Patient Chief Complaint Left lateral foot ulcer Electronic Signature(s) Signed: 05/14/2022 10:46:13 AM By: Worthy Keeler PA-C Entered By: Worthy Keeler on 05/14/2022 10:46:13 -------------------------------------------------------------------------------- Debridement Details Patient Name: Date of Service: [name] RO LD F. 05/14/2022 10:30 A M Medical Record Number: [id number] Patient Account Number: [id number] Date of Birth/Sex: Treating RN: January 11, 1961 (61 y.o. [name] Primary Care Provider: [name] Other Clinician: Massie Kluver Referring Provider: Treating Provider/Extender: [name] in Treatment: 15 Debridement Performed for Assessment: Wound #14 Left,Lateral Foot Performed By: Physician Tommie Sams., PA-C Debridement Type: Debridement Severity of Tissue Pre Debridement: Fat layer exposed Level of Consciousness (Pre-procedure): Awake and Alert Pre-procedure Verification/Time Out Yes - 11:25 Taken: Start Time: 11:25 T Area Debrided (L x W): otal 6.5 (cm) x 5 (cm) = 32.5 (cm) Tissue and other material debrided: Viable, Non-Viable, Callus, Slough, Subcutaneous, Slough Level: Skin/Subcutaneous Tissue Debridement Description: Excisional Instrument: Curette Bleeding: Minimum Hemostasis Achieved: Pressure Response to Treatment: Procedure was tolerated well Level of Consciousness (Post- Awake  and Alert procedure): KHIYAN, CRACE ([id number]) [id number] Page 2 of 14 Post Debridement Measurements of Total Wound Length: (cm) 3.2 Width: (cm) 1.8 Depth: (cm) 0.3 Volume: (cm) 1.357 Character of Wound/Ulcer Post Debridement: Stable Severity of Tissue Post Debridement: Fat layer exposed Post Procedure Diagnosis Same as Pre-procedure Electronic Signature(s) Signed: 05/14/2022 5:24:20 PM By: Gretta Cool, BSN, RN, CWS, Kim RN, BSN Signed: 05/15/2022 2:43:36 PM By: Worthy Keeler PA-C Signed: 05/16/2022 5:06:55 PM By: Massie Kluver Entered By: Massie Kluver on 05/14/2022 11:33:00 -------------------------------------------------------------------------------- HPI Details Patient Name: Date of Service: [name] RO LD F. 05/14/2022 10:30 A M Medical Record Number: [id number] Patient Account Number: [id number] Date of Birth/Sex: Treating RN: [birth year] (61 y.o. [name] Primary Care Provider: [name] Other Clinician: Massie Kluver Referring Provider: Treating Provider/Extender: [name] in Treatment: 15 History of Present Illness HPI Description: Pleasant 61 year old with history of diabetes (Hgb A1c 10.8 in 2014) and peripheral neuropathy. No PVD. L ABI 1.1. Status post right great toe partial amputation years ago. He was at work and on 10/22/2014, was injured by a cart, and suffered an ulceration to his left anterior calf. He says that it subsequently became infected, and he was treated with a course of antibiotics. He was found on initial exam to have an ulceration on the dorsum of his left third toe. He was unaware of this and attributes it to pressure from his steel toed boots. More recently he injured his right anterior calf on a cart. Ambulating normally per his baseline. He has been undergoing regular debridements, applying mupirocin cream, and an Ace wrap for edema control. He returns to clinic for follow-up  and is without complaints. No pain. No fever or chills. No drainage. 10/25/15; this is a 61 year old man who has type II diabetes with diabetic polyneuropathy. He tells me that he fractured his left fifth metatarsal in June 2016 when he presented with swelling. He does not recall a specific injury. His hemoglobin A1c was apparently too high  at the time for consideration of surgery and he was put in some form of offloading. Ultimately he went to surgery in December with an allograft from his calcaneus to this site, plate and screws. He had an x-ray of the foot in March that showed concerns about nonunion. He tells me that in March he had to move and basically moved himself. He was on his foot a lot and then noticed some drainage from an open area. He has been following with his orthopedic surgeon Dr. Doran Durand. He has been applying a felt donut, dry dressing and using his heel healing sandal. 11/01/15; this is a patient I saw last week for the first time. He had a small open wound on the plantar aspect of his left foot at roughly the level of the base of his fifth metatarsal. He had a considerable degree of thickened skin around this wound on the plantar aspect which I thought was from chronic pressure on this area. He tells [place] that he had drainage over the course of the week. No systemic symptoms. 11/08/15; culture last week grew Citrobacter korseri. This should've been sensitive to the Augmentin I gave him. He has seen Dr. Doran Durand who did his initial surgery and according the patient the plan is to give this another month and then the hardware might need to come out of this. This seems like a reasonable plan. I will adjust his antibiotics to ciprofloxacin which probably should continue for at least another 2 weeks. I gave him 10 days worth today 11/22/15 the patient has completed antibiotics. He has an appointment with Dr. Doran Durand this Friday. There is improved dimensions around the wound on the left fifth  metatarsal base 11/29/15; the patient has completed antibiotics last week. Apparently his appointment with Dr. Doran Durand it is not until this Friday. Dimensions are roughly the same. 12/06/15; saw Dr. Doran Durand. No x-ray told the end of the month, next appointment June 30. We have been using Aquacel Ag 12/13/15: No major change this week. Using Aquacel AG 621/17; arrived this week with maceration around the wound. There was quite a bit of undermining which required surgical debridement. I changed him to [facility] last week, by the patient's admission he was up on this more this week 12/27/15; macerated tissue around the wound is removed with a scalpel and pickups. There is no undermining. Nonviable subcutaneous tissue and skin taken from the superior circumference of the wound is slough from the surface. READMISSION 03/06/16 since I last saw this patient at the end of June, he went for surgery on 01/11/16 by Dr. Doran Durand of orthopedics. He had a left foot irrigation and debridement, removal of hardware and placement of wound VAC. He is also been followed by Dr. Megan Salon of infectious disease and completed a six-week course of IV Rocephin for group B strep and Enterobacter in the bone at the time of surgery. Apparently at the time of surgery the bone looked healthy so I don't think any bone was actually removed. He has been using silver alginate based dressings on the same wound area at the base of the left fifth metatarsal EVERHETT, BOZARD ([id number]) [id number] Page 3 of 14 on the left. I note that he is also had arterial studies on 01/08/16, these showed a left ABI of 1.2 to and a right ABI of 1.3. Waveforms were listed as biphasic. He was not felt to have any specific arterial issues. 03/13/16; no real change in the condition of the wound at the  left lateral foot at roughly the base of his fifth metatarsal. Use silver alginate last week. 03/18/16 arrives today with no open area.  Being suspicious of the overlying callus I. Some of this back although I see nothing but covering tissue here/epithelium. There is no surrounding tenderness READMISSION 04/16/16 this is a patient I discharged about a month ago. Initially a surgical wound on the lateral aspect of his left foot which subsequently became infected. The story he is giving today that he went back into his own modified shoe started to notice pain 2 weeks ago he was seen in Dr. Nona Dell office by a physician assistant last Wednesday and according the patient was told that everything looked fine however this is clearly now broken open and he has an open wound in the same spot that we have been dealing with repetitively. Situation is complicated by the fact that he is running short of money on long-term disability. He has not taken his insulin and at least 2 weeks was previously on NovoLog short acting insulin on a sliding scale and TRESIBA 35 units at bedtime. He is no longer able to afford any of his medication he was in the x-ray on 10/15. A plain x-ray showed healed fracture of the left fifth metatarsal bone status post removal of the associated plate and screw fixation hardware. There was no acute appearing osseous abnormality. His blood work showed a white count of 9.2 with an essentially normal differential comprehensive metabolic panel was normal. Previous CT scan of the foot on 01/08/16 showed no osteomyelitis previous vascular workup showed no evidence of significant PAD on 01/13/16 04/23/16; culture I did last week grew enterococcus [ampicillin sensitive] and MRSA. He saw infectious disease yesterday. They stop the clindamycin and ordered an MRI. This is not unreasonable. All the hardware is out of the foot at this point. 04/30/16 at this point in time we are still awaiting the results of the MRI at this point in time. Patient did have an area which appears to be somewhat macerated in the proximal portion of the wound  where there is overlying necrotic skin that is doing nothing more than trapping fluid underneath. He continues to state that he does have some discomfort and again the concern is for the possibility of osteomyelitis hence the reason for the MRI order. We have been using a silver alginate dressing but again I think the reason this with his macerated as it was is that the dressing obscene could not reach the entirety of the wound due to some necrotic skin covering the proximal portion. No bleeding noted at this point in time on initial evaluation. 05/07/16 we receive the results of patient's MRI which shows that he has no evidence of osteomyelitis. This obviously is excellent news. Patient was definitely happy to hear this. He tells me the wound appears to be doing very well at this point in time and is pleased with the progress currently. 05/15/16; as noted the patient did not have osteomyelitis. He has been released by infectious disease and orthopedics. His wound is still open he had a debridement last week but complain when he got home he "bleeding for half a day". He is not had any pain. We have been using silver alginate with Kerlix gauze wrap. 05/28/16; patient arrives today with the wound in much the same condition as last time. He has a small opening on the lateral aspect of his foot however with debridement there is clear undermining medially there is no  real evidence of infection here and I didn't see any point in culturing this. One would have to wonder if this isn't a simple matter of in adequate offloading as he is been using a healing sandal. 06/05/16; the open area is now on the plantar aspect of his foot and not decide. The wound almost appears to have "migrated". This was the term use by our intake nurse. 06/12/16; open area on the plantar aspect of his foot. Base of the wound looks very healthy. This will be his second week in a total contact cast 06/19/16; patient arrived today in a  total contact cast. There was some expectation from our staff and myself that this area would be healed. Unfortunately the area was boggy and with rec pressure a fairly substantial amount of purulent drainage was obtained. Specimen obtained for culture. The patient had no complaints of systemic problems including fever or chills or instability of his diabetes. There was no pain in the foot. Nevertheless a extensive debridement was required. 06/26/16; patient's culture from the abscess last week grew a combination of MRSA and ampicillin sensitive enterococcus. I had him on Augmentin and Septra however I have elected to give him a full 10 day course of Zyvox instead as I Recently treated this combination of organisms with Augmentin and Septra before. Arrives today with no systemic symptoms 07/03/16; the patient has 2 more treatments of Zyvox and then he is finished antibiotics the wound has improved now mostly on the lateral aspect of his foot. There is still some tenderness when he walks. 07/10/16; patient arrives today with Zyvox completed. He only has a small open area remaining. 07/24/16; she arrives here with no overt open area. The covering is thick callus/eschar. Nevertheless there is no open area here. He has some tenderness underneath the area but no overt infection is observed no drainage. The patient has a deformity in the foot with this area weekly to be exposed to more pressure in his foot where nevertheless that something we are going to have to deal with going forward. The patient has diabetic workboots and diabetic shoes. He has had a long difficult course with the area here. This started as a fracture at work. He had bone grafting from his calcaneus and screws. This got infected he had to have more surgery on the area. Bone at the time of that surgery I think showed enterococcus and group B strep. He had 6 weeks of Rocephin. Since then the area has waxed and waned in its difficulty.  Recently he had an MRI in December that did not show osteomyelitis nevertheless he had an abscess that grew MRSA and enterococcus which I elected to treat with Zyvox. This was in December and this wound is actually "healed" over 08/21/16; the patient came in today for his one-month follow-up visit. The area on the lateral aspect of his left foot looks much the same as some month ago. There is no evidence of an open wound here. However the patient tells me about a week after he went back to work he developed severe pain and swelling in the plantar aspect of his right foot first and fifth metatarsal heads. 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 He went to see his podiatrist at [facility] She told him that he could not work on his feet. She told him to go back and his cam walking boot on  the left. Not have open wounds obviously on the right. The patient is actually gone ahead and retired from his job because he does not feel he can work on his feet 09/18/16; patient comes back in saying he recently had some pain on the lateral aspect of his left foot. Asked that we look at this. Other than that all of his wounds have a viable surface. He has diabetic shoes. He is retired from work. READMISSION Hristopher Missildine is a 61 year old man who we cared for for a prolonged period of time in late 2017 into March 2018. At that point he had suffered a fracture of his left fifth metatarsal at about the level of the base of the fifth metatarsal. He had surgical repair by Dr. Meryl Dare and he developed a very refractory wound over the fifth metatarsal. Ultimately this became infected he required hardware removal this eventually closed over and we discharged from the clinic in March of this year. He tells me as well until roughly 3 weeks ago when he developed increasing pain in the same area making it difficult for him to sleep. He  was admitted to hospital from 9/5 through 9/7 with now an ulcer in the same area on the lateral aspect of his left foot at the level of the base of the metatarsal. X- rays and MRIs during this hospitalization were negative. Wound culture showed a combination of Escherichia coli, Morganella and Pseudomonas. He was given IV Cipro and vancomycin the hospital and then discharged home on Cipro and Doxy. He returned to the ER on 9/16 with leg swelling and discomfort. I don't think anything was added at that point although he did have an ultrasound of the left leg that was negative for DVT He was back in the ER on 10/4 again with . pain in the area he was given Bactrim but states he had a significant allergic reaction to that which has abated once he stopped the Bactrim. I don't think he had a full course. He went to see his podiatrist yesterday who recommended some form of foot soak. He has a Darco forefoot offloading boot The patient is a type II diabetic on insulin. T me his recent hemoglobin A1c was 7. Arterial studies done in July 2017 showed an ABI in the right of 1.3 on the ells left 1.2 to biphasic waveforms bilaterally. He was not felt to have arterial disease. As noted the patient has had 2 MRIs most recently on 10/4 this showed no evidence of an abscess or osteomyelitis and no change since the prior study of 03/05/17 there was nonspecific edema on the dorsum of the foot. ABIs in this clinic today 1.2 which would be unchanged from his study done in July/17 04/15/17; now down to 1 small wound on the left lateral foot. We wrapped him and applied collagen last week and the foot is fairly macerated. 04/22/17; small wound on the left lateral foot however it has some undermining. Using collagen 04/29/17; small wound on the left lateral foot some surrounding callus. Chronic damage in this area as a result of initial fracture nonhealing and secondary infection. 05/06/17; the patient arrives today with the  area on the left lateral foot closed. There is thick subcutaneous tissue with a layer of callus. I took some of the callus off just to ensure adequate closure of the underlying tissue and there is no open wound here. He has considerable deformity of this area of his foot however and unless there is an  ability to offload this I think opening is going to be recurrent. He tells me he has diabetic foot wear although I have not actually seen this MEREK, NIU ([id number]) [phone].pdf Page 4 of 14 Readmission: 09/02/17 on evaluation today patient appears to be doing a little better compared to what he tells me what's going on in regard to his left lateral foot. He was previously seen in the ER this past Saturday it is now Tuesday. On Saturday he actually was treated with antibiotics including Cipro 500 mg two times a day and doxycycline 100 mg two times a day. Subsequently he also did have an x-ray performed of his left foot which revealed increased degenerative changes of the base of the fifth metatarsal since comparison in 2018. There was no radiographic evidence of osteomyelitis however. Patient has previously had an MRI of the left foot which was performed on 04/03/17 in this revealed at that point a soft tissue ulceration but no evidence of osteomyelitis. Patient has previously undergone testing in regard to his ABI's by Dr. Bridgett Larsson and this showed that he did have ABI was within normal limits which does correspond with ABI's we checked here in the office today. That study was on 01/13/16. With all that being said on evaluation today there does not appear to be any evidence of a true open wound of the left lateral fifth metatarsal region. He does have tenderness noted as well is a little bit of boggy/fluctuance feeling to the area in this region of his foot and there is definitely a very solid and firm callous noted laterally. With that being said I am not able to find any obvious  opening at this point there does appear to be a spot where there appears to been a opening very recently however patient states he has not noted any drainage from this currently. He does however have discomfort with palpation of this area this is in the 3-4/10 range only with very firm palpation this is not too significant at all. Subsequently he does have a small ulcer at the medial nail bed of the right second toe where he inadvertently pulled off a portion of the toenail softly causing this injury. Otherwise there does not appear to be any evidence of infection in regard to this right second toe. 09/23/17 on evaluation today patient actually appears to be doing well he has no open ulcerations noted at this point. With that being said he did have significant callous noted over the left lateral foot which did require callous pairing today he tolerated this without complication. Readmission: 11/07/17 patient presents today for readmission concerning to ulcers that he has. One on the right plantar fifth metatarsal region and the other on the left plantar fifth metatarsal region. He has previously had an x-ray which was performed on 11/05/17 and revealed that the patient had wounds noted of the base of the fifth metatarsal region without evidence of destruction to the bone to suggest osteomyelitis. Obviously this is excellent news. With that being said he does have a significantly large ulcer that extensively plantar surface of the wound and is concerning for the fact that he continues to walk on this due to the patient telling me that he "has to work" I do believe this has likely led to both the opening of the wound as well as the fact that is not really able to heal very well at this point. He has been seen by his podiatrist and apparently according to the  note dated 10/23/17 the patient was provided with Santyl and recommended a dry dressing. No wound culture has been obtained at this point. As stated  above he has had returned to work due to financial reasons and is not able to wear his postop shoes due to the fact that he cannot wear this and work. The wound is stated to have been present for "several weeks" prior to the visit with the podiatrist on 10/23/17. With that being said again at least the good news is there does not appear to be any evidence of osteomyelitis. 11/14/17 on evaluation today patient appears to be doing poorly in regard to his bilateral foot ulcers. Unfortunately he seems to have significant callous with a lot of maceration underlined especially on the right even more so than the left. He has been tolerating the dressing changes without complication. With that being said overall I do think he's gonna have to have a significant debridement today. 11/21/17 on evaluation today patient presents for follow-up concerning his bilateral lateral foot ulcers. He has been tolerating the dressing changes without complication. His wife helps perform these for him. With that being said the patient states that the left lateral foot ulcer actually is continuing to give him trouble as far as discomfort is concerned. With that being said actually appears better. 11/28/17 on evaluation today patient appears to be doing poorly in regard to his left lower extremity ulcer in particular. His right as made some progress on the lateral portion although the plantar portion is actually getting somewhat worse I think this is likely due to the fact that he continues to work although it hurts and he's been having a lot of issues out of this side and as far as pain is concerned. With that being said my biggest concern on evaluation today is that of the patient having issues with increased pain in the left lower extremity ulcer site especially on the plantar aspect. He also appears to have what seems to be an abscess developing in the dorsal/lateral portion of his foot which I think likely is communicating  somewhere internally with the ulcer site. Nonetheless he has not had any fevers, chills, nausea, or vomiting noted at this time. He has however had fairly significant increase in pain and he was Artie having a lot of pain previous. Readmission: 01-29-2022 upon evaluation today patient presents for initial inspection here in our clinic concerning the current issue which has been present since around Oct 29, 2021. The patient tells me that he has at this point been seen by Dr. Vickki Muff who has been managing this up to this point. He has also been seen by Dr. Lazaro Arms I will outline the findings there shortly as well. With that being said currently according to Dr. Alvera Singh note on 12-18-2021 the patient has a prescription for Zosyn that was given by infectious disease. Again that was back in June however. There was a hospital encounter for incision and drainage and debridement of the foot in order to clearway necrotic tissue. The patient also underwent vascular evaluation with Dr. Lucky Cowboy. Nonetheless on the postop visit on 12-18-2021 the patient was seeming to be doing quite well based on what Dr. Vickki Muff said but nonetheless since that time is really not made much progress and is concerned about losing his foot. He therefore requested a referral to wound care to be seen and evaluated and see if there is anything we can do to help and aid with getting this to heal.  Fortunately there does not appear to be any evidence of active infection locally or systemically at this time which is great news. He tells me that he is currently using Betadine wet-to-dry dressings. He does not note any systemic infections but does have evidence of local infection based on what we are seeing. He has had a recent chest x-ray which showed that he had no evidence of acute cardiopulmonary disease that was performed on 12-12-2021. He also underwent vascular intervention with Dr. 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 Subsequently the patient states that Dr. Lucky Cowboy has mentioned that he would need to go back at some time with Dr. Lucky Cowboy said he would consider a posterior tibial approach for revascularization in the future but at this time because of kidney status and the amount of dye used he did not want to proceed any further at that point. Again that something that would likely be scheduled down the road. Nonetheless right now the patient does have an ABI on the left of 0.90 with a TBI of 0.6-0 but this was decreased compared to previous. His ABI on the right was 1.08. Patient did have as well MRI of the left foot which was performed on 12-12-2021 and this shows that he does have interval resection of the proximal third of the fifth metatarsal and area of osteomyelitis on prior MRI. There is persistent ulceration of plantar lateral aspect at the resection site with a sinus tract extending towards the adjacent cuboid bone findings suspicious for osteomyelitis of the plantar lateral aspect of the cuboid and adjacent base of the fourth metatarsal possibly in the proximal most aspect of the remaining fifth metatarsal shaft as well. The ABI performed on 12-13-2021 showed that the left ABI was 0.83 which had previously been registered at 1.35 and the right ABI was 1.20. I think this is consistent with being able to heal but obviously would do better with vascular intervention not something that is going to be looked into in the future. This was noted above as well. Based on what I am seeing today I do believe that the patient is definitely a candidate for HBO therapy I think this may be in his best interest as I think he is at the point of being in a limb threatening situation. I do believe that he would benefit from hyperbarics along with continued antibiotics along with good wound care measures as  well. 02-04-2022 upon evaluation today patient appears to be doing still poorly in regard to his foot he is having a lot of drainage and he notes odor as well. With that being said we still have not heard anything back from insurance yet as far as approval for HBO therapy. Subsequently the patient does have evidence of osteomyelitis and I think hyperbarics is definitely going to be something that would be beneficial for him. He has been on antibiotics several times previous but unfortunately this just does not seem to be sustained enough we did do a culture it showed Pseudomonas as one of the primary organisms both organisms would be treated with Levaquin which I think is going to be the best way to go. I discussed that with the patient today and I Georgina Peer send that into the pharmacy. 02-11-2022 upon evaluation today patient appears to be doing well currently  in regard to his wound. I do feel like the Levaquin is doing better for him which is great news. Fortunately I do not see any evidence of active infection locally or systemically at this time which is great news. No fevers, chills, nausea, vomiting, or diarrhea. 02-19-2022 upon evaluation today patient appears to be doing excellent in regard to his foot ulcer all things considered he still has bone exposed but the good news is we are at least making some progress here I think he does have a lot of odor he tells me and I think switching over to Dakin's moistened gauze dressing is probably can be the way to go. 02-26-2022 upon evaluation today patient appears to be doing well currently in regard to his wound all things considered. Although the 1 thing I do see is that he is not changing the dressing as frequently as he should be. Fortunately there does not appear to be any signs of active infection he tells me his wife is out of town from Saturday through today so it has not been changed since Saturday before that they have been doing this every other day.  I explained that with the MAAN, ZARCONE ([id number]) [phone].pdf Page 5 of 66 Dakin's he needs to do this every day in order to make sure that it heals appropriately. This will also help with the odor which she has been complaining of. 03-11-2022 upon evaluation today patient appears to be doing better overall visually in regard to his wound. He is tolerating the dressing changes without complication. Fortunately there does not appear to be any evidence of active infection locally or systemically at this time. No fevers, chills, nausea, vomiting, or diarrhea. 03-19-2022 upon evaluation today patient appears to be doing decently well in regard to his wound. He is actually tolerating dressing changes without Complication. Fortunately there does not appear to be any signs of active infection locally or systemically at this time which is great news. No fevers, chills, nausea, vomiting, or diarrhea. 04-02-2022 upon evaluation today patient's wound is actually showing signs of significant improvement I am actually very pleased with where we stand today. I do not see any evidence of infection locally or systemically at this time which is great news. 04-08-2022 upon evaluation today patient actually appears to be making good progress. I am very pleased with where things stand his wound is smaller he seems to be tolerating hyperbarics well he is doing well with wound care and in general I think we are making excellent headway here. I do believe that he would likely benefit from extending the hyperbarics. I think that he is making such good progress I do not want to see anything backtrack on him at this point. Overall the benefit coupled with the antibiotics and an excellent wound care that we have been working with him with and that his wife is continuing to do at home I think he is making a great impact in getting this healed to prevent limb loss. 04-16-2022 upon evaluation today  patient appears to be doing excellent with the [facility] dressing. Coupled with hyperbarics he is managing quite nicely and healing quite nicely. Fortunately I do not see any evidence of active infection locally or systemically which is great news and overall I am extremely pleased with where we stand today. No fevers, chills, nausea, vomiting, or diarrhea. 04-23-2022 upon evaluation today patient appears to be doing well currently in regard to his wound for the most part although there  is some area of rubbing on the lateral aspect as well as the top of his foot. When questioned he actually has been more active over the weekend he tells me that it was his birthday and he had some friends over. Subsequently I think this is what led to things being somewhat worse compared to what they were previous. Fortunately I do not see any evidence of active infection locally or systemically at this time which is great news. No fevers, chills, nausea, vomiting, or diarrhea. We are still waiting on the approval for continuation of HBO therapy. 04-30-2022 upon evaluation today patient's wound is actually showing signs of improvement. He continues to utilize the knee scooter which I think is doing well for him. The volume continues to improve the size got a little worse last week due to the fact that he was up on this more. The good news is he is actually back down to more normal situation at this point today. There is some need for sharp debridement. 11/7 patient was seen in conjunction with HBO which she is tolerating well. HBO dive #47 today. Wound is on the left plantar foot on the lateral aspect. He has been making excellent progress. 05-14-2022 upon evaluation today patient appears to be doing well currently in regard to his wound. Has been tolerating the dressing changes without complication and overall I am extremely pleased with where we stand currently. There is no evidence of active infection locally  nor systemically at this time. Electronic Signature(s) Signed: 05/14/2022 5:46:39 PM By: Worthy Keeler PA-C Entered By: Worthy Keeler on 05/14/2022 17:46:39 -------------------------------------------------------------------------------- Physical Exam Details Patient Name: Date of Service: [name] RO LD F. 05/14/2022 10:30 A M Medical Record Number: [id number] Patient Account Number: [id number] Date of Birth/Sex: Treating RN: 08/13/1960 (61 y.o. [name] Primary Care Provider: [name] Other Clinician: Massie Kluver Referring Provider: Treating Provider/Extender: [name] in Treatment: 45 Constitutional Well-nourished and well-hydrated in no acute distress. Respiratory normal breathing without difficulty. Psychiatric this patient is able to make decisions and demonstrates good insight into disease process. Alert and Oriented x 3. pleasant and cooperative. Notes Patient's wound bed showed signs of good granulation and epithelization. He has been tolerating HBO therapy and overall I am extremely pleased with where we stand I am hopeful that he will continue to show signs of good improvement. Electronic Signature(s) Signed: 05/14/2022 5:47:01 PM By: Vic Blackbird, [place] F ([id number]) PM By: Worthy Keeler PA-C [phone].pdf Page 6 of 14 Signed: 05/14/2022 5:47:01 Entered By: Worthy Keeler on 05/14/2022 17:47:00 -------------------------------------------------------------------------------- Physician Orders Details Patient Name: Date of Service: [name] RO LD F. 05/14/2022 10:30 A M Medical Record Number: [id number] Patient Account Number: [id number] Date of Birth/Sex: Treating RN: 08/14/1960 (61 y.o. [name] Primary Care Provider: [name] Other Clinician: Massie Kluver Referring Provider: Treating Provider/Extender: [name] in Treatment: 15 Verbal /  Phone Orders: No Diagnosis Coding ICD-10 Coding Code Description [phone] Chronic multifocal osteomyelitis, left ankle and foot E11.621 Type 2 diabetes mellitus with foot ulcer L97.524 Non-pressure chronic ulcer of other part of left foot with necrosis of bone T81.31XA Disruption of external operation (surgical) wound, not elsewhere classified, initial encounter Follow-up Appointments Return Appointment in 1 week. Bathing/ Shower/ Hygiene May shower with wound dressing protected with water repellent cover or cast protector. Anesthetic (Use 'Patient Medications' Section for Anesthetic Order Entry) Lidocaine applied to wound bed Edema Control -  Lymphedema / Segmental Compressive Device / Other Elevate, Exercise Daily and A void Standing for Long Periods of Time. Elevate legs to the level of the heart and pump ankles as often as possible Elevate leg(s) parallel to the floor when sitting. Off-Loading Open toe surgical shoe Other: - knee scooter Additional Orders / Instructions Other: - need daily dressing changes Hyperbaric Oxygen Therapy Wound #14 Left,Lateral Foot Indication and location: - Left foot osteomyelitis If appropriate for treatment, begin HBOT per protocol: 2.0 ATA for 90 Minutes without A Breaks ir One treatment per day (delivered Monday through Friday unless otherwise specified in Special Instructions below): Total # of Treatments: - 40 additional 40 requested for a total of 80 A ntihistamine 30 minutes prior to HBO Treatment, difficulty clearing ears. Finger stick Blood Glucose Pre- and Post- HBOT Treatment. Follow Hyperbaric Oxygen Glycemia Protocol Wound Treatment Wound #14 - Foot Wound Laterality: Left, Lateral Cleanser: Byram Ancillary Kit - 15 Day Supply (Generic) 3 x Per Week/30 Days Discharge Instructions: Use supplies as instructed; Kit contains: (15) Saline Bullets; (15) 3x3 Gauze; 15 pr Gloves Cleanser: Wound Cleanser 3 x Per Week/30 Days Discharge  Instructions: Wash your hands with soap and water. Remove old dressing, discard into plastic bag and place into trash. Cleanse the wound with Wound Cleanser prior to applying a clean dressing using gauze sponges, not tissues or cotton balls. Do not scrub or use excessive force. Pat dry using gauze sponges, not tissue or cotton balls. KAROL, LIENDO ([id number]) [id number] Page 7 of 14 Prim Dressing: Hydrofera Blue Ready Transfer Foam, 4x5 (in/in) (Dispense As Written) 3 x Per Week/30 Days ary Discharge Instructions: Apply Hydrofera Blue Ready to wound bed as directed Secondary Dressing: ABD Pad 5x9 (in/in) (Generic) 3 x Per Week/30 Days Discharge Instructions: Cover with ABD pad Secured With: Medipore T - 73M Medipore H Soft Cloth Surgical T ape ape, 2x2 (in/yd) (Generic) 3 x Per Week/30 Days Secured With: [employer] Sterile or Non-Sterile 6-ply 4.5x4 (yd/yd) (Generic) 3 x Per Week/30 Days Discharge Instructions: Apply Kerlix as directed Davison pre-HBO capillary blood glucose (ensure 1 physician order is in chart). A. Notify HBO physician and await physician orders. 2 If result is 70 mg/dl or below: B. If the result meets the hospital definition of a critical result, follow hospital policy. A. Give patient an 8 ounce Glucerna Shake, an 8 ounce Ensure, or 8 ounces of a Glucerna/Ensure equivalent dietary supplement*. B. Wait 30 minutes. If result is 71 mg/dl to 130 mg/dl: C. Retest patients capillary blood glucose (CBG). D. If result greater than or equal to 110 mg/dl, proceed with HBO. If result less than 110 mg/dl, notify HBO physician and consider holding HBO. If result is 131 mg/dl to 249 mg/dl: A. Proceed with HBO. A. Notify HBO physician and await physician orders. B. It is recommended to hold HBO and do If result is 250 mg/dl or greater: blood/urine ketone  testing. C. If the result meets the hospital definition of a critical result, follow hospital policy. POST-HBO GLYCEMIA INTERVENTIONS ACTION INTERVENTION Obtain post HBO capillary blood glucose (ensure 1 physician order is in chart). A. Notify HBO physician and await physician orders. 2 If result is 70 mg/dl or below: B. If the result meets the hospital definition of a critical result, follow hospital policy. A. Give patient an 8 ounce Glucerna Shake, an 8 ounce Ensure, or 8 ounces of a Glucerna/Ensure equivalent dietary supplement*. B. Wait 15 minutes for  symptoms of If result is 71 mg/dl to 100 mg/dl: hypoglycemia (i.e. nervousness, anxiety, sweating, chills, clamminess, irritability, confusion, tachycardia or dizziness). C. If patient asymptomatic, discharge patient. If patient symptomatic, repeat capillary blood glucose (CBG) and notify HBO physician. If result is 101 mg/dl to 249 mg/dl: A. Discharge patient. A. Notify HBO physician and await physician orders. B. It is recommended to do blood/urine ketone If result is 250 mg/dl or greater: testing. C. If the result meets the hospital definition of a critical result, follow hospital policy. *Juice or candies are NOT equivalent products. If patient refuses the Glucerna or Ensure, please consult the hospital dietitian for an appropriate substitute. Electronic Signature(s) Signed: 05/15/2022 2:43:36 PM By: Worthy Keeler PA-C Signed: 05/16/2022 5:06:55 PM By: Massie Kluver Entered By: Massie Kluver on 05/14/2022 11:33:20 [name] ([id number]) [id number] Page 8 of 14 -------------------------------------------------------------------------------- Problem List Details Patient Name: Date of Service: [name] RO LD F. 05/14/2022 10:30 A M Medical Record Number: [id number] Patient Account Number: [id number] Date of Birth/Sex: Treating RN: Oct 17, 1960 (61 y.o. [name], [name] Primary Care  Provider: [name] Other Clinician: Massie Kluver Referring Provider: Treating Provider/Extender: [name] in Treatment: 15 Active Problems ICD-10 Encounter Code Description Active Date MDM Diagnosis [phone] Chronic multifocal osteomyelitis, left ankle and foot 01/29/2022 No Yes E11.621 Type 2 diabetes mellitus with foot ulcer 01/29/2022 No Yes L97.524 Non-pressure chronic ulcer of other part of left foot with necrosis of bone 01/29/2022 No Yes T81.31XA Disruption of external operation (surgical) wound, not elsewhere classified, 01/29/2022 No Yes initial encounter Inactive Problems Resolved Problems Electronic Signature(s) Signed: 05/14/2022 10:46:07 AM By: Worthy Keeler PA-C Entered By: Worthy Keeler on 05/14/2022 10:46:07 -------------------------------------------------------------------------------- Progress Note Details Patient Name: Date of Service: [name] RO LD F. 05/14/2022 10:30 A M Medical Record Number: [id number] Patient Account Number: [id number] Date of Birth/Sex: Treating RN: [birth year] (61 y.o. [name] Primary Care Provider: [name] Other Clinician: Massie Kluver Referring Provider: Treating Provider/Extender: [name] in Treatment: [address], [place] F ([id number]) [id number] Page 9 of 14 Subjective Chief Complaint Information obtained from Patient Left lateral foot ulcer History of Present Illness (HPI) Pleasant 61 year old with history of diabetes (Hgb A1c 10.8 in 2014) and peripheral neuropathy. No PVD. L ABI 1.1. Status post right great toe partial amputation years ago. He was at work and on 10/22/2014, was injured by a cart, and suffered an ulceration to his left anterior calf. He says that it subsequently became infected, and he was treated with a course of antibiotics. He was found on initial exam to have an ulceration on the dorsum of his left third toe.  He was unaware of this and attributes it to pressure from his steel toed boots. More recently he injured his right anterior calf on a cart. Ambulating normally per his baseline. He has been undergoing regular debridements, applying mupirocin cream, and an Ace wrap for edema control. He returns to clinic for follow-up and is without complaints. No pain. No fever or chills. No drainage. 10/25/15; this is a 61 year old man who has type II diabetes with diabetic polyneuropathy. He tells me that he fractured his left fifth metatarsal in June 2016 when he presented with swelling. He does not recall a specific injury. His hemoglobin A1c was apparently too high at the time for consideration of surgery and he was put in some form of offloading. Ultimately he went to surgery in December with an allograft from  his calcaneus to this site, plate and screws. He had an x-ray of the foot in March that showed concerns about nonunion. He tells me that in March he had to move and basically moved himself. He was on his foot a lot and then noticed some drainage from an open area. He has been following with his orthopedic surgeon Dr. Doran Durand. He has been applying a felt donut, dry dressing and using his heel healing sandal. 11/01/15; this is a patient I saw last week for the first time. He had a small open wound on the plantar aspect of his left foot at roughly the level of the base of his fifth metatarsal. He had a considerable degree of thickened skin around this wound on the plantar aspect which I thought was from chronic pressure on this area. He tells [place] that he had drainage over the course of the week. No systemic symptoms. 11/08/15; culture last week grew Citrobacter korseri. This should've been sensitive to the Augmentin I gave him. He has seen Dr. Doran Durand who did his initial surgery and according the patient the plan is to give this another month and then the hardware might need to come out of this. This seems like a  reasonable plan. I will adjust his antibiotics to ciprofloxacin which probably should continue for at least another 2 weeks. I gave him 10 days worth today 11/22/15 the patient has completed antibiotics. He has an appointment with Dr. Doran Durand this Friday. There is improved dimensions around the wound on the left fifth metatarsal base 11/29/15; the patient has completed antibiotics last week. Apparently his appointment with Dr. Doran Durand it is not until this Friday. Dimensions are roughly the same. 12/06/15; saw Dr. Doran Durand. No x-ray told the end of the month, next appointment June 30. We have been using Aquacel Ag 12/13/15: No major change this week. Using Aquacel AG 621/17; arrived this week with maceration around the wound. There was quite a bit of undermining which required surgical debridement. I changed him to [facility] last week, by the patient's admission he was up on this more this week 12/27/15; macerated tissue around the wound is removed with a scalpel and pickups. There is no undermining. Nonviable subcutaneous tissue and skin taken from the superior circumference of the wound is slough from the surface. READMISSION 03/06/16 since I last saw this patient at the end of June, he went for surgery on 01/11/16 by Dr. Doran Durand of orthopedics. He had a left foot irrigation and debridement, removal of hardware and placement of wound VAC. He is also been followed by Dr. Megan Salon of infectious disease and completed a six-week course of IV Rocephin for group B strep and Enterobacter in the bone at the time of surgery. Apparently at the time of surgery the bone looked healthy so I don't think any bone was actually removed. He has been using silver alginate based dressings on the same wound area at the base of the left fifth metatarsal on the left. I note that he is also had arterial studies on 01/08/16, these showed a left ABI of 1.2 to and a right ABI of 1.3. Waveforms were listed as biphasic. He was not felt  to have any specific arterial issues. 03/13/16; no real change in the condition of the wound at the left lateral foot at roughly the base of his fifth metatarsal. Use silver alginate last week. 03/18/16 arrives today with no open area. Being suspicious of the overlying callus I. Some of this back although  I see nothing but covering tissue here/epithelium. There is no surrounding tenderness READMISSION 04/16/16 this is a patient I discharged about a month ago. Initially a surgical wound on the lateral aspect of his left foot which subsequently became infected. The story he is giving today that he went back into his own modified shoe started to notice pain 2 weeks ago he was seen in Dr. Nona Dell office by a physician assistant last Wednesday and according the patient was told that everything looked fine however this is clearly now broken open and he has an open wound in the same spot that we have been dealing with repetitively. Situation is complicated by the fact that he is running short of money on long-term disability. He has not taken his insulin and at least 2 weeks was previously on NovoLog short acting insulin on a sliding scale and TRESIBA 35 units at bedtime. He is no longer able to afford any of his medication he was in the x-ray on 10/15. A plain x-ray showed healed fracture of the left fifth metatarsal bone status post removal of the associated plate and screw fixation hardware. There was no acute appearing osseous abnormality. His blood work showed a white count of 9.2 with an essentially normal differential comprehensive metabolic panel was normal. Previous CT scan of the foot on 01/08/16 showed no osteomyelitis previous vascular workup showed no evidence of significant PAD on 01/13/16 04/23/16; culture I did last week grew enterococcus [ampicillin sensitive] and MRSA. He saw infectious disease yesterday. They stop the clindamycin and ordered an MRI. This is not unreasonable. All the hardware  is out of the foot at this point. 04/30/16 at this point in time we are still awaiting the results of the MRI at this point in time. Patient did have an area which appears to be somewhat macerated in the proximal portion of the wound where there is overlying necrotic skin that is doing nothing more than trapping fluid underneath. He continues to state that he does have some discomfort and again the concern is for the possibility of osteomyelitis hence the reason for the MRI order. We have been using a silver alginate dressing but again I think the reason this with his macerated as it was is that the dressing obscene could not reach the entirety of the wound due to some necrotic skin covering the proximal portion. No bleeding noted at this point in time on initial evaluation. 05/07/16 we receive the results of patient's MRI which shows that he has no evidence of osteomyelitis. This obviously is excellent news. Patient was definitely happy to hear this. He tells me the wound appears to be doing very well at this point in time and is pleased with the progress currently. 05/15/16; as noted the patient did not have osteomyelitis. He has been released by infectious disease and orthopedics. His wound is still open he had a debridement last week but complain when he got home he "bleeding for half a day". He is not had any pain. We have been using silver alginate with Kerlix gauze wrap. 05/28/16; patient arrives today with the wound in much the same condition as last time. He has a small opening on the lateral aspect of his foot however with debridement there is clear undermining medially there is no real evidence of infection here and I didn't see any point in culturing this. One would have to wonder if this isn't a simple matter of in adequate offloading as he is been using a healing  sandal. 06/05/16; the open area is now on the plantar aspect of his foot and not decide. The wound almost appears to have  "migrated". This was the term use by our intake nurse. 06/12/16; open area on the plantar aspect of his foot. Base of the wound looks very healthy. This will be his second week in a total contact cast 06/19/16; patient arrived today in a total contact cast. There was some expectation from our staff and myself that this area would be healed. Unfortunately the area was boggy and with rec pressure a fairly substantial amount of purulent drainage was obtained. Specimen obtained for culture. The patient had no complaints of systemic problems including fever or chills or instability of his diabetes. There was no pain in the foot. Nevertheless a extensive debridement was required. 06/26/16; patient's culture from the abscess last week grew a combination of MRSA and ampicillin sensitive enterococcus. I had him on Augmentin and Septra however I have elected to give him a full 10 day course of Zyvox instead as I Recently treated this combination of organisms with Augmentin and Septra before. Arrives today with no systemic symptoms TREYVION, DURKEE ([id number]) [id number] Page 10 of 14 07/03/16; the patient has 2 more treatments of Zyvox and then he is finished antibiotics the wound has improved now mostly on the lateral aspect of his foot. There is still some tenderness when he walks. 07/10/16; patient arrives today with Zyvox completed. He only has a small open area remaining. 07/24/16; she arrives here with no overt open area. The covering is thick callus/eschar. Nevertheless there is no open area here. He has some tenderness underneath the area but no overt infection is observed no drainage. The patient has a deformity in the foot with this area weekly to be exposed to more pressure in his foot where nevertheless that something we are going to have to deal with going forward. The patient has diabetic workboots and diabetic shoes. He has had a long difficult course with the area here.  This started as a fracture at work. He had bone grafting from his calcaneus and screws. This got infected he had to have more surgery on the area. Bone at the time of that surgery I think showed enterococcus and group B strep. He had 6 weeks of Rocephin. Since then the area has waxed and waned in its difficulty. Recently he had an MRI in December that did not show osteomyelitis nevertheless he had an abscess that grew MRSA and enterococcus which I elected to treat with Zyvox. This was in December and this wound is actually "healed" over 08/21/16; the patient came in today for his one-month follow-up visit. The area on the lateral aspect of his left foot looks much the same as some month ago. There is no evidence of an open wound here. However the patient tells me about a week after he went back to work he developed severe pain and swelling in the plantar aspect of his right foot first and fifth metatarsal heads. 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 He went to see his podiatrist at [facility]. She told him that he could not work on his feet. She told him to go back and his cam walking boot on the left. Not have open wounds obviously on the right. The patient is actually gone ahead and retired from his job because he does not feel he  can work on his feet 09/18/16; patient comes back in saying he recently had some pain on the lateral aspect of his left foot. Asked that we look at this. Other than that all of his wounds have a viable surface. He has diabetic shoes. He is retired from work. READMISSION Deshun Sedivy is a 61 year old man who we cared for for a prolonged period of time in late 2017 into March 2018. At that point he had suffered a fracture of his left fifth metatarsal at about the level of the base of the fifth metatarsal. He had surgical repair by Dr. Meryl Dare and he developed a very refractory wound  over the fifth metatarsal. Ultimately this became infected he required hardware removal this eventually closed over and we discharged from the clinic in March of this year. He tells me as well until roughly 3 weeks ago when he developed increasing pain in the same area making it difficult for him to sleep. He was admitted to hospital from 9/5 through 9/7 with now an ulcer in the same area on the lateral aspect of his left foot at the level of the base of the metatarsal. X- rays and MRIs during this hospitalization were negative. Wound culture showed a combination of Escherichia coli, Morganella and Pseudomonas. He was given IV Cipro and vancomycin the hospital and then discharged home on Cipro and Doxy. He returned to the ER on 9/16 with leg swelling and discomfort. I don't think anything was added at that point although he did have an ultrasound of the left leg that was negative for DVT He was back in the ER on 10/4 again with . pain in the area he was given Bactrim but states he had a significant allergic reaction to that which has abated once he stopped the Bactrim. I don't think he had a full course. He went to see his podiatrist yesterday who recommended some form of foot soak. He has a Darco forefoot offloading boot The patient is a type II diabetic on insulin. T me his recent hemoglobin A1c was 7. Arterial studies done in July 2017 showed an ABI in the right of 1.3 on the ells left 1.2 to biphasic waveforms bilaterally. He was not felt to have arterial disease. As noted the patient has had 2 MRIs most recently on 10/4 this showed no evidence of an abscess or osteomyelitis and no change since the prior study of 03/05/17 there was nonspecific edema on the dorsum of the foot. ABIs in this clinic today 1.2 which would be unchanged from his study done in July/17 04/15/17; now down to 1 small wound on the left lateral foot. We wrapped him and applied collagen last week and the foot is fairly  macerated. 04/22/17; small wound on the left lateral foot however it has some undermining. Using collagen 04/29/17; small wound on the left lateral foot some surrounding callus. Chronic damage in this area as a result of initial fracture nonhealing and secondary infection. 05/06/17; the patient arrives today with the area on the left lateral foot closed. There is thick subcutaneous tissue with a layer of callus. I took some of the callus off just to ensure adequate closure of the underlying tissue and there is no open wound here. He has considerable deformity of this area of his foot however and unless there is an ability to offload this I think opening is going to be recurrent. He tells me he has diabetic foot wear although I have not actually seen this  Readmission: 09/02/17 on evaluation today patient appears to be doing a little better compared to what he tells me what's going on in regard to his left lateral foot. He was previously seen in the ER this past Saturday it is now Tuesday. On Saturday he actually was treated with antibiotics including Cipro 500 mg two times a day and doxycycline 100 mg two times a day. Subsequently he also did have an x-ray performed of his left foot which revealed increased degenerative changes of the base of the fifth metatarsal since comparison in 2018. There was no radiographic evidence of osteomyelitis however. Patient has previously had an MRI of the left foot which was performed on 04/03/17 in this revealed at that point a soft tissue ulceration but no evidence of osteomyelitis. Patient has previously undergone testing in regard to his ABI's by Dr. Bridgett Larsson and this showed that he did have ABI was within normal limits which does correspond with ABI's we checked here in the office today. That study was on 01/13/16. With all that being said on evaluation today there does not appear to be any evidence of a true open wound of the left lateral fifth metatarsal region. He does  have tenderness noted as well is a little bit of boggy/fluctuance feeling to the area in this region of his foot and there is definitely a very solid and firm callous noted laterally. With that being said I am not able to find any obvious opening at this point there does appear to be a spot where there appears to been a opening very recently however patient states he has not noted any drainage from this currently. He does however have discomfort with palpation of this area this is in the 3-4/10 range only with very firm palpation this is not too significant at all. Subsequently he does have a small ulcer at the medial nail bed of the right second toe where he inadvertently pulled off a portion of the toenail softly causing this injury. Otherwise there does not appear to be any evidence of infection in regard to this right second toe. 09/23/17 on evaluation today patient actually appears to be doing well he has no open ulcerations noted at this point. With that being said he did have significant callous noted over the left lateral foot which did require callous pairing today he tolerated this without complication. Readmission: 11/07/17 patient presents today for readmission concerning to ulcers that he has. One on the right plantar fifth metatarsal region and the other on the left plantar fifth metatarsal region. He has previously had an x-ray which was performed on 11/05/17 and revealed that the patient had wounds noted of the base of the fifth metatarsal region without evidence of destruction to the bone to suggest osteomyelitis. Obviously this is excellent news. With that being said he does have a significantly large ulcer that extensively plantar surface of the wound and is concerning for the fact that he continues to walk on this due to the patient telling me that he "has to work" I do believe this has likely led to both the opening of the wound as well as the fact that is not really able to heal very  well at this point. He has been seen by his podiatrist and apparently according to the note dated 10/23/17 the patient was provided with Santyl and recommended a dry dressing. No wound culture has been obtained at this point. As stated above he has had returned to work due to financial  reasons and is not able to wear his postop shoes due to the fact that he cannot wear this and work. The wound is stated to have been present for "several weeks" prior to the visit with the podiatrist on 10/23/17. With that being said again at least the good news is there does not appear to be any evidence of osteomyelitis. 11/14/17 on evaluation today patient appears to be doing poorly in regard to his bilateral foot ulcers. Unfortunately he seems to have significant callous with a lot of maceration underlined especially on the right even more so than the left. He has been tolerating the dressing changes without complication. With that being said overall I do think he's gonna have to have a significant debridement today. 11/21/17 on evaluation today patient presents for follow-up concerning his bilateral lateral foot ulcers. He has been tolerating the dressing changes without complication. His wife helps perform these for him. With that being said the patient states that the left lateral foot ulcer actually is continuing to give him trouble as far as discomfort is concerned. With that being said actually appears better. 11/28/17 on evaluation today patient appears to be doing poorly in regard to his left lower extremity ulcer in particular. His right as made some progress on the lateral portion although the plantar portion is actually getting somewhat worse I think this is likely due to the fact that he continues to work although it hurts and he's been having a lot of issues out of this side and as far as pain is concerned. With that being said my biggest concern on evaluation today is that of the patient having issues with  increased pain in the left lower extremity ulcer site especially on the plantar aspect. He also appears to have what seems to be an abscess developing in the dorsal/lateral portion of his foot which I think likely is communicating somewhere internally with the ulcer site. Nonetheless he has not had any fevers, chills, nausea, or vomiting noted at this time. He has however had fairly significant increase in pain and he was Artie having a lot of pain ANIRUDDH, CIAVARELLA ([id number]) [id number] Page 11 of 14 previous. Readmission: 01-29-2022 upon evaluation today patient presents for initial inspection here in our clinic concerning the current issue which has been present since around Oct 29, 2021. The patient tells me that he has at this point been seen by Dr. Vickki Muff who has been managing this up to this point. He has also been seen by Dr. Lazaro Arms I will outline the findings there shortly as well. With that being said currently according to Dr. Alvera Singh note on 12-18-2021 the patient has a prescription for Zosyn that was given by infectious disease. Again that was back in June however. There was a hospital encounter for incision and drainage and debridement of the foot in order to clearway necrotic tissue. The patient also underwent vascular evaluation with Dr. Lucky Cowboy. Nonetheless on the postop visit on 12-18-2021 the patient was seeming to be doing quite well based on what Dr. Vickki Muff said but nonetheless since that time is really not made much progress and is concerned about losing his foot. He therefore requested a referral to wound care to be seen and evaluated and see if there is anything we can do to help and aid with getting this to heal. Fortunately there does not appear to be any evidence of active infection locally or systemically at this time which is great news. He tells me that he  is currently using Betadine wet-to-dry dressings. He does not note any systemic infections but does  have evidence of local infection based on what we are seeing. He has had a recent chest x-ray which showed that he had no evidence of acute cardiopulmonary disease that was performed on 12-12-2021. He also underwent vascular intervention with Dr. 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 Subsequently the patient states that Dr. Lucky Cowboy has mentioned that he would need to go back at some time with Dr. Lucky Cowboy said he would consider a posterior tibial approach for revascularization in the future but at this time because of kidney status and the amount of dye used he did not want to proceed any further at that point. Again that something that would likely be scheduled down the road. Nonetheless right now the patient does have an ABI on the left of 0.90 with a TBI of 0.6-0 but this was decreased compared to previous. His ABI on the right was 1.08. Patient did have as well MRI of the left foot which was performed on 12-12-2021 and this shows that he does have interval resection of the proximal third of the fifth metatarsal and area of osteomyelitis on prior MRI. There is persistent ulceration of plantar lateral aspect at the resection site with a sinus tract extending towards the adjacent cuboid bone findings suspicious for osteomyelitis of the plantar lateral aspect of the cuboid and adjacent base of the fourth metatarsal possibly in the proximal most aspect of the remaining fifth metatarsal shaft as well. The ABI performed on 12-13-2021 showed that the left ABI was 0.83 which had previously been registered at 1.35 and the right ABI was 1.20. I think this is consistent with being able to heal but obviously would do better with vascular intervention not something that is going to be looked into in the future. This was noted above as well. Based on what I am seeing  today I do believe that the patient is definitely a candidate for HBO therapy I think this may be in his best interest as I think he is at the point of being in a limb threatening situation. I do believe that he would benefit from hyperbarics along with continued antibiotics along with good wound care measures as well. 02-04-2022 upon evaluation today patient appears to be doing still poorly in regard to his foot he is having a lot of drainage and he notes odor as well. With that being said we still have not heard anything back from insurance yet as far as approval for HBO therapy. Subsequently the patient does have evidence of osteomyelitis and I think hyperbarics is definitely going to be something that would be beneficial for him. He has been on antibiotics several times previous but unfortunately this just does not seem to be sustained enough we did do a culture it showed Pseudomonas as one of the primary organisms both organisms would be treated with Levaquin which I think is going to be the best way to go. I discussed that with the patient today and I Georgina Peer send that into the pharmacy. 02-11-2022 upon evaluation today patient appears to be doing well currently in regard to his wound. I do feel like the Levaquin is doing better for him which is great news. Fortunately I do not see any evidence  of active infection locally or systemically at this time which is great news. No fevers, chills, nausea, vomiting, or diarrhea. 02-19-2022 upon evaluation today patient appears to be doing excellent in regard to his foot ulcer all things considered he still has bone exposed but the good news is we are at least making some progress here I think he does have a lot of odor he tells me and I think switching over to Dakin's moistened gauze dressing is probably can be the way to go. 02-26-2022 upon evaluation today patient appears to be doing well currently in regard to his wound all things considered. Although the 1  thing I do see is that he is not changing the dressing as frequently as he should be. Fortunately there does not appear to be any signs of active infection he tells me his wife is out of town from Saturday through today so it has not been changed since Saturday before that they have been doing this every other day. I explained that with the Dakin's he needs to do this every day in order to make sure that it heals appropriately. This will also help with the odor which she has been complaining of. 03-11-2022 upon evaluation today patient appears to be doing better overall visually in regard to his wound. He is tolerating the dressing changes without complication. Fortunately there does not appear to be any evidence of active infection locally or systemically at this time. No fevers, chills, nausea, vomiting, or diarrhea. 03-19-2022 upon evaluation today patient appears to be doing decently well in regard to his wound. He is actually tolerating dressing changes without Complication. Fortunately there does not appear to be any signs of active infection locally or systemically at this time which is great news. No fevers, chills, nausea, vomiting, or diarrhea. 04-02-2022 upon evaluation today patient's wound is actually showing signs of significant improvement I am actually very pleased with where we stand today. I do not see any evidence of infection locally or systemically at this time which is great news. 04-08-2022 upon evaluation today patient actually appears to be making good progress. I am very pleased with where things stand his wound is smaller he seems to be tolerating hyperbarics well he is doing well with wound care and in general I think we are making excellent headway here. I do believe that he would likely benefit from extending the hyperbarics. I think that he is making such good progress I do not want to see anything backtrack on him at this point. Overall the benefit coupled with the  antibiotics and an excellent wound care that we have been working with him with and that his wife is continuing to do at home I think he is making a great impact in getting this healed to prevent limb loss. 04-16-2022 upon evaluation today patient appears to be doing excellent with the [facility] dressing. Coupled with hyperbarics he is managing quite nicely and healing quite nicely. Fortunately I do not see any evidence of active infection locally or systemically which is great news and overall I am extremely pleased with where we stand today. No fevers, chills, nausea, vomiting, or diarrhea. 04-23-2022 upon evaluation today patient appears to be doing well currently in regard to his wound for the most part although there is some area of rubbing on the lateral aspect as well as the top of his foot. When questioned he actually has been more active over the weekend he tells me that it was his birthday  and he had some friends over. Subsequently I think this is what led to things being somewhat worse compared to what they were previous. Fortunately I do not see any evidence of active infection locally or systemically at this time which is great news. No fevers, chills, nausea, vomiting, or diarrhea. We are still waiting on the approval for continuation of HBO therapy. 04-30-2022 upon evaluation today patient's wound is actually showing signs of improvement. He continues to utilize the knee scooter which I think is doing well for him. The volume continues to improve the size got a little worse last week due to the fact that he was up on this more. The good news is he is actually back down to more normal situation at this point today. There is some need for sharp debridement. 11/7 patient was seen in conjunction with HBO which she is tolerating well. HBO dive #[id number] today. Wound is on the left plantar foot on the lateral aspect. He has been making excellent progress. 05-14-2022 upon evaluation today  patient appears to be doing well currently in regard to his wound. Has been tolerating the dressing changes without complication and overall I am extremely pleased with where we stand currently. There is no evidence of active infection locally nor systemically at this time. KARON, HECKENDORN ([id number]) [id number] Page 12 of 14 Objective Constitutional Well-nourished and well-hydrated in no acute distress. Vitals Time Taken: 10:44 AM, Height: 70 in, Weight: 255 lbs, BMI: 36.6, Temperature: 97.8 F, Pulse: 90 bpm, Respiratory Rate: 16 breaths/min, Blood Pressure: 122/68 mmHg. Respiratory normal breathing without difficulty. Psychiatric this patient is able to make decisions and demonstrates good insight into disease process. Alert and Oriented x 3. pleasant and cooperative. General Notes: Patient's wound bed showed signs of good granulation and epithelization. He has been tolerating HBO therapy and overall I am extremely pleased with where we stand I am hopeful that he will continue to show signs of good improvement. Integumentary (Hair, Skin) Wound #14 status is Open. Original cause of wound was Blister. The date acquired was: 10/29/2021. The wound has been in treatment 15 weeks. The wound is located on the Left,Lateral Foot. The wound measures 3.2cm length x 1.8cm width x 0.3cm depth; 4.524cm^2 area and 1.357cm^3 volume. There is muscle and Fat Layer (Subcutaneous Tissue) exposed. There is no tunneling or undermining noted. There is a medium amount of serosanguineous drainage noted. The wound margin is epibole. There is large (67-100%) red granulation within the wound bed. There is a small (1-33%) amount of necrotic tissue within the wound bed including Adherent Slough. Assessment Active Problems ICD-10 Chronic multifocal osteomyelitis, left ankle and foot Type 2 diabetes mellitus with foot ulcer Non-pressure chronic ulcer of other part of left foot with necrosis  of bone Disruption of external operation (surgical) wound, not elsewhere classified, initial encounter Procedures Wound #14 Pre-procedure diagnosis of Wound #14 is a Diabetic Wound/Ulcer of the Lower Extremity located on the Left,Lateral Foot .Severity of Tissue Pre Debridement is: Fat layer exposed. There was a Excisional Skin/Subcutaneous Tissue Debridement with a total area of 32.5 sq cm performed by Tommie Sams., PA-C. With the following instrument(s): Curette to remove Viable and Non-Viable tissue/material. Material removed includes Callus, Subcutaneous Tissue, and Slough. A time out was conducted at 11:25, prior to the start of the procedure. A Minimum amount of bleeding was controlled with Pressure. The procedure was tolerated well. Post Debridement Measurements: 3.2cm length x 1.8cm width x 0.3cm depth; 1.357cm^3 volume. Character of Wound/Ulcer Post  Debridement is stable. Severity of Tissue Post Debridement is: Fat layer exposed. Post procedure Diagnosis Wound #14: Same as Pre-Procedure Plan Follow-up Appointments: Return Appointment in 1 week. Bathing/ Shower/ Hygiene: May shower with wound dressing protected with water repellent cover or cast protector. Anesthetic (Use 'Patient Medications' Section for Anesthetic Order Entry): Lidocaine applied to wound bed Edema Control - Lymphedema / Segmental Compressive Device / Other: Elevate, Exercise Daily and Avoid Standing for Long Periods of Time. Elevate legs to the level of the heart and pump ankles as often as possible Elevate leg(s) parallel to the floor when sitting. Off-Loading: Open toe surgical shoe Other: - knee scooter Additional Orders / Instructions: Other: - need daily dressing changes Hyperbaric Oxygen Therapy: Wound #14 Left,Lateral Foot: Indication and location: - Left foot osteomyelitis If appropriate for treatment, begin HBOT per protocol: 2.0 ATA for 90 Minutes without Air Breaks One treatment per day  (delivered Monday through Friday unless otherwise specified in Special Instructions below): T # of Treatments: - 40 additional 40 requested for a total of 80 otal Antihistamine 30 minutes prior to HBO Treatment, difficulty clearing ears. REFORD, OLLIFF ([id number]) [id number] Page 13 of 14 Finger stick Blood Glucose Pre- and Post- HBOT Treatment. Follow Hyperbaric Oxygen Glycemia Protocol WOUND #14: - Foot Wound Laterality: Left, Lateral Cleanser: Byram Ancillary Kit - 15 Day Supply (Generic) 3 x Per Week/30 Days Discharge Instructions: Use supplies as instructed; Kit contains: (15) Saline Bullets; (15) 3x3 Gauze; 15 pr Gloves Cleanser: Wound Cleanser 3 x Per Week/30 Days Discharge Instructions: Wash your hands with soap and water. Remove old dressing, discard into plastic bag and place into trash. Cleanse the wound with Wound Cleanser prior to applying a clean dressing using gauze sponges, not tissues or cotton balls. Do not scrub or use excessive force. Pat dry using gauze sponges, not tissue or cotton balls. Prim Dressing: Hydrofera Blue Ready Transfer Foam, 4x5 (in/in) (Dispense As Written) 3 x Per Week/30 Days ary Discharge Instructions: Apply Hydrofera Blue Ready to wound bed as directed Secondary Dressing: ABD Pad 5x9 (in/in) (Generic) 3 x Per Week/30 Days Discharge Instructions: Cover with ABD pad Secured With: Medipore T - 73M Medipore H Soft Cloth Surgical T ape ape, 2x2 (in/yd) (Generic) 3 x Per Week/30 Days Secured With: [employer] Sterile or Non-Sterile 6-ply 4.5x4 (yd/yd) (Generic) 3 x Per Week/30 Days Discharge Instructions: Apply Kerlix as directed 1. I am going to recommend that we have the patient continue to monitor for any signs of worsening or infection. Right now I do believe that he is doing quite well with the [facility] and we did clean the wound today and the surface looks much better postdebridement a very pleased in that regard.  Obviously we will need to continue to monitor the situation but I do believe that we will continue hyperbarics until Tuesday once he leaves for his out-of-town vacation where just cannot stop the hyperbaric oxygen therapy at that point he will be a treatment #57 but I do not see a point and bring it back for 3 additional visits following after he has been off for about a week. The patient voiced understanding he is in agreement with that plan. 2. I am a continue with the recommendation for the knee scooter I think he needs to stay on this obviously is doing a really good job for him and I do not see any need to make any changes in that regard. We will see patient back for reevaluation in 1  week here in the clinic. If anything worsens or changes patient will contact our office for additional recommendations. Electronic Signature(s) Signed: 05/14/2022 5:48:13 PM By: Worthy Keeler PA-C Entered By: Worthy Keeler on 05/14/2022 17:48:12 -------------------------------------------------------------------------------- SuperBill Details Patient Name: Date of Service: Maryfrances Bunnell, Lamonte Sakai RO LD F. 05/14/2022 Medical Record Number: [id number] Patient Account Number: [id number] Date of Birth/Sex: Treating RN: 1961-04-07 (61 y.o. 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 of bone T81.31XA Disruption of external operation (surgical) wound, not elsewhere classified, initial encounter Facility Procedures : CPT4 Code: 03491791 Description: 50569 - DEB SUBQ TISSUE 20 SQ CM/< ICD-10 Diagnosis Description L97.524 Non-pressure chronic ulcer of  other part of left foot with necrosis of bone Modifier: Quantity: 1 : Gahm, H CPT4 Code: [id number] AROLD F ([id number]) Description: 11045 - DEB SUBQ TISS EA ADDL 20CM ICD-10 Diagnosis Description L97.524 Non-pressure chronic ulcer of other part of left foot with necrosis of bone [phone] Modifier: [id number] Page Quantity: 1 14 of 14 Physician Procedures : CPT4 Code Description Modifier [id number] 98264 - WC PHYS SUBQ TISS 20 SQ CM ICD-10 Diagnosis Description L97.524 Non-pressure chronic ulcer of other part of left foot with necrosis of bone Quantity: 1 : [id number] 07680 - WC PHYS SUBQ TISS EA ADDL 20 CM ICD-10 Diagnosis Description L97.524 Non-pressure chronic ulcer of other part of left foot with necrosis of bone Quantity: 1 Electronic Signature(s) Signed: 05/14/2022 5:53:53 PM By: Worthy Keeler PA-C Entered By: Worthy Keeler on 05/14/2022 17:53:52

## 2022-05-15 ENCOUNTER — Encounter (HOSPITAL_BASED_OUTPATIENT_CLINIC_OR_DEPARTMENT_OTHER): Payer: 59 | Admitting: Internal Medicine

## 2022-05-15 DIAGNOSIS — E11621 Type 2 diabetes mellitus with foot ulcer: Secondary | ICD-10-CM | POA: Diagnosis not present

## 2022-05-15 DIAGNOSIS — M86372 Chronic multifocal osteomyelitis, left ankle and foot: Secondary | ICD-10-CM | POA: Diagnosis not present

## 2022-05-15 DIAGNOSIS — L97524 Non-pressure chronic ulcer of other part of left foot with necrosis of bone: Secondary | ICD-10-CM | POA: Diagnosis not present

## 2022-05-15 LAB — GLUCOSE, CAPILLARY
Glucose-Capillary: 197 mg/dL — ABNORMAL HIGH (ref 70–99)
Glucose-Capillary: 139 mg/dL — ABNORMAL HIGH (ref 70–99)

## 2022-05-15 NOTE — Progress Notes (Signed)
OAKES, MCCREADY ([id number]) [id number] Page 1 of 2 Visit Report for 05/13/2022 HBO Details Patient Name: Date of Service: [name] RO LD F. 05/13/2022 8:00 A M Medical Record Number: [id number] Patient Account Number: [id number] Date of Birth/Sex: Treating RN: Apr 30, 1961 (61 y.o. 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 HBO Treatment Details Treatment Number: 51 Patient Type: Outpatient Chamber Type: Monoplace Chamber Serial #: [id number] Treatment Protocol: 2.0 ATA with 90 minutes oxygen, and no air breaks Treatment Details Compression Rate Down: 1.5 psi / minute De-Compression Rate Up: 2.0 psi / minute Air breaks and breathing Decompress Decompress Compress Tx Pressure Begins Reached periods Begins Ends (leave unused spaces blank) Chamber Pressure (ATA 1 2 ------2 1 ) Clock Time (24 hr) 08:23 08:33 - - - - - - 10:04 10:11 Treatment Length: 108 (minutes) Treatment Segments: 4 Vital Signs Capillary Blood Glucose Reference Range: 80 - 120 mg / dl HBO Diabetic Blood Glucose Intervention Range: <131 mg/dl or >249 mg/dl Type: Time Vitals Blood Respiratory Capillary Blood Glucose Pulse Action Pulse: Temperature: Taken: Pressure: Rate: Glucose (mg/dl): Meter #: Oximetry (%) Taken: Pre 08:14 116/76 78 16 97.9 243 1 none per protocol Post 10:25 122/78 72 16 97.6 165 1 none per protocol Treatment Response Treatment Toleration: Well Treatment Completion Status: Treatment Completed without Adverse Event HBO Attestation I certify that I supervised this HBO treatment in accordance with  Medicare guidelines. A trained emergency response team is readily available per Yes hospital policies and procedures. Continue HBOT as ordered. Yes Electronic Signature(s) Signed: 05/14/2022 5:55:49 PM By: Worthy Keeler PA-C Previous Signature: 05/13/2022 11:44:07 AM Version By: [name] RCP, RRT CHT , , Entered By: Worthy Keeler on 05/14/2022 17:55:49 [name] ([id number]) [id number] Page 2 of 2 -------------------------------------------------------------------------------- HBO Safety Checklist Details Patient Name: Date of Service: [name] RO LD F. 05/13/2022 8:00 A M Medical Record Number: [id number] Patient Account Number: [id number] Date of Birth/Sex: Treating RN: 1960-09-11 (61 y.o. [name], [name] Primary Care Elaiza Shoberg: [name] Other Clinician: [name] Referring [name]: Treating [name]/Extender: [name] in Treatment: 14 HBO Safety Checklist Items Safety Checklist Consent Form Signed Patient voided / foley secured and emptied When did you last eato 05/12/22 pm Last dose of injectable or oral agent 05/12/22 pm Ostomy pouch emptied and vented if applicable NA All implantable devices assessed, documented and approved NA Intravenous access site secured and place NA Valuables secured Linens and cotton and cotton/polyester blend (less than 51% polyester) Personal oil-based products / skin lotions / body lotions removed Wigs or hairpieces removed NA Smoking or tobacco materials removed NA Books / newspapers / magazines / loose paper removed NA Cologne, aftershave, perfume and deodorant removed Jewelry removed (may wrap wedding band) Make-up removed NA Hair care products removed Battery operated devices (external) removed NA Heating patches and chemical warmers removed NA Titanium eyewear removed NA Nail polish cured greater than 10 hours NA Casting material cured  greater than 10 hours NA Hearing aids removed NA Loose dentures or partials removed NA Prosthetics have been removed NA Patient demonstrates correct use of air break device (if applicable) Patient concerns have been addressed Patient grounding bracelet on and cord attached  to chamber Specifics for Inpatients (complete in addition to above) Medication sheet sent with patient Intravenous medications needed or due during therapy sent with patient Drainage tubes (e.g. nasogastric tube or chest tube secured and vented) Endotracheal or Tracheotomy tube secured Cuff deflated of air and inflated with saline Airway suctioned Electronic Signature(s) Signed: 05/13/2022 11:44:07 AM By: [name] RCP, RRT CHT , , Entered By: [name] on 05/13/2022 09:48:53 ,

## 2022-05-15 NOTE — Progress Notes (Signed)
KYSTON, GONCE ([id number]) [id number] Page 1 of 2 Visit Report for 05/14/2022 HBO Details Patient Name: Date of Service: [name] RO LD F. 05/14/2022 8:00 A M Medical Record Number: [id number] Patient Account Number: [id number] Date of Birth/Sex: Treating RN: [birth year] (61 y.o. [name], [name] Primary Care Michaeleen Down: [name] Other Clinician: [name] Referring Martiza Speth: Treating Aizen Duval/Extender: [name] in Treatment: 15 HBO Treatment Course Details Treatment Course Number: 1 Ordering Yvonna Brun: [name] T Treatments Ordered: otal 60 HBO Treatment Start Date: 02/18/2022 HBO Indication: Chronic Refractory Osteomyelitis to Left Ankle and Foot HBO Treatment Details Treatment Number: 52 Patient Type: Outpatient Chamber Type: Monoplace Chamber Serial #: [id number] Treatment Protocol: 2.0 ATA with 90 minutes oxygen, and no air breaks Treatment Details Compression Rate Down: 1.5 psi / minute De-Compression Rate Up: 2.0 psi / minute Air breaks and breathing Decompress Decompress Compress Tx Pressure Begins Reached periods Begins Ends (leave unused spaces blank) Chamber Pressure (ATA 1 2 ------2 1 ) Clock Time (24 hr) 08:16 08:26 - - - - - - 09:56 10:05 Treatment Length: 109 (minutes) Treatment Segments: 4 Vital Signs Capillary Blood Glucose Reference Range: 80 - 120 mg / dl HBO Diabetic Blood Glucose Intervention Range: <131 mg/dl or >249 mg/dl Type: Time Vitals Blood Respiratory Capillary Blood Glucose Pulse Action Pulse: Temperature: Taken: Pressure: Rate: Glucose (mg/dl): Meter #: Oximetry (%) Taken: Pre 08:03 118/70 78 16 98.2 217 1 none per protocol Post 10:15 122/68 90 16 97.8 159 1 none per protocol Treatment Response Treatment Toleration: Well Treatment Completion Status: Treatment Completed without Adverse Event HBO Attestation I certify that I supervised this HBO treatment in accordance with  Medicare guidelines. A trained emergency response team is readily available per Yes hospital policies and procedures. Continue HBOT as ordered. Yes Electronic Signature(s) Signed: 05/14/2022 5:55:05 PM By: Worthy Keeler PA-C Previous Signature: 05/14/2022 11:23:18 AM Version By: [name] RCP, RRT CHT , , Entered By: Worthy Keeler on 05/14/2022 17:55:05 [name] ([id number]) [id number] Page 2 of 2 -------------------------------------------------------------------------------- HBO Safety Checklist Details Patient Name: Date of Service: [name] RO LD F. 05/14/2022 8:00 A M Medical Record Number: [id number] Patient Account Number: [id number] Date of Birth/Sex: Treating RN: Nov 09, 1960 (61 y.o. [name], [name] Primary Care Dewaine Morocho: [name] Other Clinician: [name] Referring Leilanie Rauda: Treating Kiwan Gadsden/Extender: [name] in Treatment: 15 HBO Safety Checklist Items Safety Checklist Consent Form Signed Patient voided / foley secured and emptied When did you last eato 05/13/22 pm Last dose of injectable or oral agent 05/13/22 pm Ostomy pouch emptied and vented if applicable NA All implantable devices assessed, documented and approved NA Intravenous access site secured and place NA Valuables secured Linens and cotton and cotton/polyester blend (less than 51% polyester) Personal oil-based products / skin lotions / body lotions removed Wigs or hairpieces removed NA Smoking or tobacco materials removed NA Books / newspapers / magazines / loose paper removed NA Cologne, aftershave, perfume and deodorant removed Jewelry removed (may wrap wedding band) Make-up removed NA Hair care products removed Battery operated devices (external) removed NA Heating patches and chemical warmers removed NA Titanium eyewear removed NA Nail polish cured greater than 10 hours NA Casting material cured  greater than 10 hours NA Hearing aids removed NA Loose dentures or partials removed NA Prosthetics have been removed NA Patient demonstrates correct use of air break device (if applicable) Patient concerns have been addressed Patient grounding bracelet on and cord attached  to chamber Specifics for Inpatients (complete in addition to above) Medication sheet sent with patient Intravenous medications needed or due during therapy sent with patient Drainage tubes (e.g. nasogastric tube or chest tube secured and vented) Endotracheal or Tracheotomy tube secured Cuff deflated of air and inflated with saline Airway suctioned Electronic Signature(s) Signed: 05/14/2022 11:23:18 AM By: [name] RCP, RRT CHT , , Entered By: [name] on 05/14/2022 08:29:26 ,

## 2022-05-15 NOTE — Progress Notes (Signed)
LEVANDER, KATZENSTEIN ([id number]) [id number] Page 1 of 2 Visit Report for 05/15/2022 Arrival Information Details Patient Name: Date of Service: [name] RO LD F. 05/15/2022 8:00 A M Medical Record Number: [id number] Patient Account Number: [id number] Date of Birth/Sex: Treating RN: Nov 29, 1960 (61 y.o. [name]) Carlene Coria Primary Care [name]: [name] Other Clinician: [name] Referring Zyion Leidner: Treating [name]/Extender: [name] in Treatment: 15 Visit Information History Since Last Visit Added or deleted any medications: No Patient Arrived: Knee Scooter Any new allergies or adverse reactions: No Arrival Time: 08:10 Had a fall or experienced change in No Accompanied By: self activities of daily living that may affect Transfer Assistance: None risk of falls: Patient Identification Verified: Yes Signs or symptoms of abuse/neglect since No Secondary Verification Process Completed: Yes last visito Patient Requires Transmission-Based Precautions: No Hospitalized since last visit: No Patient Has Alerts: No Implantable device outside of the clinic No excluding cellular tissue based products placed in the center since last visit: Has Dressing in Place as Prescribed: Yes Has Footwear/Offloading in Place as Yes Prescribed: Left: Surgical Shoe with Pressure Relief Insole Pain Present Now: No Electronic Signature(s) Signed: 05/15/2022 1:10:53 PM By: [name] RCP, RRT CHT , , Entered By: [name] on 05/15/2022 08:32:01 , -------------------------------------------------------------------------------- Encounter Discharge Information Details Patient Name: Date of Service: [name] RO LD F. 05/15/2022 8:00 A M Medical Record Number: [id number] Patient Account Number: [id number] Date of Birth/Sex: Treating RN: 07-Aug-1960 (61 y.o. [name]) Carlene Coria Primary Care Auriel Kist: [name] Other Clinician: [name] Referring Kendric Sindelar: Treating Muneeb Veras/Extender: [name] in Treatment: 15 Encounter Discharge Information Items Discharge Condition: Stable Ambulatory Status: Knee Scooter Discharge Destination: Home [name], [name] ([id number]) [id number] Page 2 of 2 Transportation: Private Auto Accompanied By: self Schedule Follow-up Appointment: Yes Clinical Summary of Care: Notes Patient has an HBO treatment scheduled on 05/16/22 at 08:00 am. Electronic Signature(s) Signed: 05/15/2022 1:10:53 PM By: [name] RCP, RRT CHT , , Entered By: [name] on 05/15/2022 13:07:47 , -------------------------------------------------------------------------------- Vitals Details Patient Name: Date of Service: [name] RO LD F. 05/15/2022 8:00 A M Medical Record Number: [id number] Patient Account Number: [id number] Date of Birth/Sex: Treating RN: [birth year] (61 y.o. [name]) Carlene Coria Primary Care Quinley Nesler: [name] Other Clinician: [name] Referring Lizette Pazos: Treating [name]/Extender: [name] in Treatment: 15 Vital Signs Time Taken: 08:17 Temperature (F): 97.9 Height (in): 70 Pulse (bpm): 72 Weight (lbs): 255 Respiratory Rate (breaths/min): 16 Body Mass Index (BMI): 36.6 Blood Pressure (mmHg): 118/68 Capillary Blood Glucose (mg/dl): 197 Reference Range: 80 - 120 mg / dl Electronic Signature(s) Signed: 05/15/2022 1:10:53 PM By: [name] RCP, RRT CHT , , Entered By: [name] on 05/15/2022 08:33:16 ,

## 2022-05-15 NOTE — Progress Notes (Signed)
SADIK, PIASCIK ([id number]) [id number] Page 1 of 2 Visit Report for 05/13/2022 Problem List Details Patient Name: Date of Service: [name] RO LD F. 05/13/2022 8:00 A M Medical Record Number: [id number] Patient Account Number: [id number] Date of Birth/Sex: Treating RN: July 17, 1960 (61 y.o. [name], [name] Primary Care Provider: [name] Other Clinician: [name] Referring Provider: Treating Provider/Extender: [name] in Treatment: 14 Active Problems ICD-10 Encounter Code Description Active Date MDM Diagnosis [phone] Chronic multifocal osteomyelitis, left ankle and foot 01/29/2022 No Yes E11.621 Type 2 diabetes mellitus with foot ulcer 01/29/2022 No Yes L97.524 Non-pressure chronic ulcer of other part of left foot with necrosis of 01/29/2022 No Yes bone T81.31XA Disruption of external operation (surgical) wound, not elsewhere 01/29/2022 No Yes classified, initial encounter Inactive Problems Resolved Problems Electronic Signature(s) Signed: 05/14/2022 5:55:41 PM By: Worthy Keeler PA-C Entered By: Worthy Keeler on 05/14/2022 17:55:41 -------------------------------------------------------------------------------- SuperBill Details Patient Name: Date of Service: [name] RO LD F. 05/13/2022 Medical Record Number: [id number] Patient Account Number: [id number] FINNEAN, CERAMI ([id number]) [id number] Page 2 of 2 Date of Birth/Sex: Treating RN: [birth year] (61 y.o. [name], [name] Primary Care Provider: [name] Other Clinician: [name] Referring Provider: Treating Provider/Extender: [name] in Treatment: 14 Diagnosis Coding ICD-10 Codes Code Description [phone] Chronic multifocal osteomyelitis, left ankle and foot E11.621 Type 2 diabetes mellitus with foot ulcer L97.524 Non-pressure chronic ulcer of other part of left foot with necrosis of  bone T81.31XA Disruption of external operation (surgical) wound, not elsewhere classified, initial encounter Facility Procedures : CPT4 Code: [id number] Description: (Facility Use Only) HBOT full body chamber, 57mn , Modifier: Quantity: 4 Physician Procedures : CPT4 Code Description Modifier [id number] 96759- WC PHYS HYPERBARIC OXYGEN THERAPY ICD-10 Diagnosis Description M86.372 Chronic multifocal osteomyelitis, left ankle and foot L97.524 Non-pressure chronic ulcer of other part of left foot with necrosis of  bone E11.621 Type 2 diabetes mellitus with foot ulcer Quantity: 1 Electronic Signature(s) Signed: 05/14/2022 5:55:55 PM By: SWorthy KeelerPA-C Previous Signature: 05/13/2022 11:44:07 AM Version By: WEnedina FinnerRCP, RRT CHT , , Entered By: SWorthy Keeleron 05/14/2022 17:55:54

## 2022-05-15 NOTE — Progress Notes (Signed)
JDEN, WANT ([id number]) [id number] Page 1 of 2 Visit Report for 05/14/2022 Problem List Details Patient Name: Date of Service: [name]. 05/14/2022 8:00 A M Medical Record Number: [id number] Patient Account Number: [id number] Date of Birth/Sex: Treating RN: 05/17/1961 (62 y.o. 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 05/14/2022 Medical Record Number: [id number] Patient Account Number: [id number] [name], [name] ([id number]) [id number] Page 2 of 2 Date of Birth/Sex: Treating RN: 05/10/1961 (61 y.o. [name], [name] Primary Care Provider: [name] Other Clinician: [name] Referring Provider: Treating Provider/Extender: [name] in Treatment: 15 Diagnosis Coding ICD-10 Codes Code Description [phone] Chronic multifocal osteomyelitis, left ankle and foot E11.621 Type 2 diabetes mellitus with foot ulcer L97.524 Non-pressure chronic ulcer of other part of left foot with necrosis of  bone T81.31XA Disruption of external operation (surgical) wound, not elsewhere classified, initial encounter Facility Procedures : CPT4 Code: [id number] Description: (Facility Use Only) HBOT full body chamber, 62mn , Modifier: Quantity: 4 Physician Procedures : CPT4 Code Description Modifier [id number] PHYS HYPERBARIC OXYGEN THERAPY ICD-10 Diagnosis Description M86.372 Chronic multifocal osteomyelitis, left ankle and foot L97.524 Non-pressure chronic ulcer of other part of left foot with necrosis of  bone E11.621 Type 2 diabetes mellitus with foot ulcer Quantity: 1 Electronic Signature(s) Signed: 05/14/2022 5:55:11 PM By: SWorthy KeelerPA-C Previous Signature: 05/14/2022 11:23:18 AM Version By: WEnedina FinnerRCP, RRT CHT , , Entered By: SWorthy Keeleron 05/14/2022 17:55:10

## 2022-05-16 ENCOUNTER — Encounter: Payer: 59 | Admitting: Physician Assistant

## 2022-05-16 DIAGNOSIS — M86372 Chronic multifocal osteomyelitis, left ankle and foot: Secondary | ICD-10-CM | POA: Diagnosis not present

## 2022-05-16 LAB — GLUCOSE, CAPILLARY
Glucose-Capillary: 208 mg/dL — ABNORMAL HIGH (ref 70–99)
Glucose-Capillary: 148 mg/dL — ABNORMAL HIGH (ref 70–99)

## 2022-05-16 NOTE — Progress Notes (Signed)
YEHIA, MCBAIN ([id number]) [id number] Page 1 of 2 Visit Report for 05/15/2022 HBO Details Patient Name: Date of Service: [name] RO LD F. 05/15/2022 8:00 A M Medical Record Number: [id number] Patient Account Number: [id number] Date of Birth/Sex: Treating RN: 1960/08/16 (61 y.o. [name]) Carlene Coria Primary Care Marlea Gambill: [name] Other Clinician: [name] Referring Willian Donson: Treating Odalis Jordan/Extender: [name] in Treatment: 15 HBO Treatment Course Details Treatment Course Number: 1 Ordering Chastin Garlitz: [name] T Treatments Ordered: otal 60 HBO Treatment Start Date: 02/18/2022 HBO Indication: Chronic Refractory Osteomyelitis to Left Ankle and Foot HBO Treatment Details Treatment Number: 53 Patient Type: Outpatient Chamber Type: Monoplace Chamber Serial #: [id number] Treatment Protocol: 2.0 ATA with 90 minutes oxygen, and no air breaks Treatment Details Compression Rate Down: 1.5 psi / minute De-Compression Rate Up: 2.0 psi / minute Air breaks and breathing Decompress Decompress Compress Tx Pressure Begins Reached periods Begins Ends (leave unused spaces blank) Chamber Pressure (ATA 1 2 ------2 1 ) Clock Time (24 hr) 08:22 08:32 - - - - - - 10:02 10:09 Treatment Length: 107 (minutes) Treatment Segments: 4 Vital Signs Capillary Blood Glucose Reference Range: 80 - 120 mg / dl HBO Diabetic Blood Glucose Intervention Range: <131 mg/dl or >249 mg/dl Type: Time Vitals Blood Respiratory Capillary Blood Glucose Pulse Action Pulse: Temperature: Taken: Pressure: Rate: Glucose (mg/dl): Meter #: Oximetry (%) Taken: Pre 08:17 118/68 72 16 97.9 197 1 none per protocol Post 10:24 120/68 66 16 97.8 139 1 none per protocol Treatment Response Treatment Toleration: Well Treatment Completion Status: Treatment Completed without Adverse Event HBO Attestation I certify that I supervised this HBO treatment in accordance  with Medicare guidelines. A trained emergency response team is readily available per Yes hospital policies and procedures. Continue HBOT as ordered. Yes Electronic Signature(s) Signed: 05/15/2022 4:43:59 PM By: Kalman Shan DO Previous Signature: 05/15/2022 1:10:53 PM Version By: [name] RCP, RRT CHT , , Entered By: Kalman Shan on 05/15/2022 14:25:58 [name] ([id number]) [id number] Page 2 of 2 -------------------------------------------------------------------------------- HBO Safety Checklist Details Patient Name: Date of Service: [name] RO LD F. 05/15/2022 8:00 A M Medical Record Number: [id number] Patient Account Number: [id number] Date of Birth/Sex: Treating RN: [birth year] (61 y.o. [name]) Carlene Coria Primary Care Neziah Vogelgesang: [name] Other Clinician: [name] Referring Debbra Digiulio: Treating Amaranta Mehl/Extender: [name] in Treatment: 15 HBO Safety Checklist Items Safety Checklist Consent Form Signed Patient voided / foley secured and emptied When did you last eato 05/14/22 pm Last dose of injectable or oral agent 05/14/22 Ostomy pouch emptied and vented if applicable NA All implantable devices assessed, documented and approved NA Intravenous access site secured and place NA Valuables secured Linens and cotton and cotton/polyester blend (less than 51% polyester) Personal oil-based products / skin lotions / body lotions removed Wigs or hairpieces removed NA Smoking or tobacco materials removed NA Books / newspapers / magazines / loose paper removed NA Cologne, aftershave, perfume and deodorant removed Jewelry removed (may wrap wedding band) Make-up removed NA Hair care products removed Battery operated devices (external) removed NA Heating patches and chemical warmers removed NA Titanium eyewear removed NA Nail polish cured greater than 10 hours NA Casting material  cured greater than 10 hours NA Hearing aids removed NA Loose dentures or partials removed NA Prosthetics have been removed NA Patient demonstrates correct use of air break device (if applicable) Patient concerns have been addressed Patient grounding bracelet on and cord attached to chamber Specifics  for Inpatients (complete in addition to above) Medication sheet sent with patient Intravenous medications needed or due during therapy sent with patient Drainage tubes (e.g. nasogastric tube or chest tube secured and vented) Endotracheal or Tracheotomy tube secured Cuff deflated of air and inflated with saline Airway suctioned Electronic Signature(s) Signed: 05/15/2022 1:10:53 PM By: [name] RCP, RRT CHT , , Entered By: [name] on 05/15/2022 08:35:54 ,

## 2022-05-16 NOTE — Progress Notes (Signed)
LEVIE, WAGES ([id number]) [id number] Page 1 of 2 Visit Report for 05/16/2022 Arrival Information Details Patient Name: Date of Service: [name] RO LD F. 05/16/2022 8:00 A M Medical Record Number: [id number] Patient Account Number: [id number] Date of Birth/Sex: Treating RN: [birth year] (61 y.o. [name], [name] Primary Care [name]: [name] Other Clinician: Jacqulyn [name] Referring [name]: Treating [name]/Extender: [name] in Treatment: 15 Visit Information History Since Last Visit Added or deleted any medications: No Patient Arrived: Knee Scooter Any new allergies or adverse reactions: No Arrival Time: 08:30 Had a fall or experienced change in No Accompanied By: self activities of daily living that may affect Transfer Assistance: None risk of falls: Patient Identification Verified: Yes Signs or symptoms of abuse/neglect since No Secondary Verification Process Completed: Yes last visito Patient Requires Transmission-Based Precautions: No Hospitalized since last visit: No Patient Has Alerts: No Implantable device outside of the clinic No excluding cellular tissue based products placed in the center since last visit: Has Dressing in Place as Prescribed: Yes Has Footwear/Offloading in Place as Yes Prescribed: Left: Surgical Shoe with Pressure Relief Insole Pain Present Now: No Electronic Signature(s) Signed: 05/16/2022 11:09:11 AM By: [name] RCP, RRT CHT , , Entered By: [name] on 05/16/2022 08:48:28 , -------------------------------------------------------------------------------- Encounter Discharge Information Details Patient Name: Date of Service: [name] RO LD F. 05/16/2022 8:00 A M Medical Record Number: [id number] Patient Account Number: [id number] Date of Birth/Sex: Treating RN: Nov 08, 1960 (61 y.o. [name], [name] Primary Care [name]: [name] Other  Clinician: Jacqulyn [name] Referring [name]: Treating [name]/Extender: [name] in Treatment: 15 Encounter Discharge Information Items Discharge Condition: Stable Ambulatory Status: Knee Scooter Discharge Destination: Home [name] ([id number]) [id number] Page 2 of 2 Transportation: Private Auto Accompanied By: self Schedule Follow-up Appointment: Yes Clinical Summary of Care: Notes Patient has an HBO treatment scheduled on 05/17/22 at 08:00 am. Electronic Signature(s) Signed: 05/16/2022 11:09:11 AM By: [name] RCP, RRT CHT , , Entered By: [name] on 05/16/2022 11:06:59 , -------------------------------------------------------------------------------- Vitals Details Patient Name: Date of Service: [name] RO LD F. 05/16/2022 8:00 A M Medical Record Number: [id number] Patient Account Number: [id number] Date of Birth/Sex: Treating RN: May 11, 1961 (61 y.o. [name], [name] Primary Care Gerome Kokesh: [name] Other Clinician: Jacqulyn [name] Referring [name]: Treating [name]/Extender: [name] in Treatment: 15 Vital Signs Time Taken: 08:39 Temperature (F): 97.9 Height (in): 70 Pulse (bpm): 72 Weight (lbs): 255 Respiratory Rate (breaths/min): 16 Body Mass Index (BMI): 36.6 Blood Pressure (mmHg): 118/68 Capillary Blood Glucose (mg/dl): 208 Reference Range: 80 - 120 mg / dl Electronic Signature(s) Signed: 05/16/2022 11:09:11 AM By: [name] RCP, RRT CHT , , Entered By: [name] on 05/16/2022 08:50:17 ,

## 2022-05-17 ENCOUNTER — Encounter: Payer: 59 | Admitting: Physician Assistant

## 2022-05-17 DIAGNOSIS — M86372 Chronic multifocal osteomyelitis, left ankle and foot: Secondary | ICD-10-CM | POA: Diagnosis not present

## 2022-05-17 LAB — GLUCOSE, CAPILLARY
Glucose-Capillary: 159 mg/dL — ABNORMAL HIGH (ref 70–99)
Glucose-Capillary: 205 mg/dL — ABNORMAL HIGH (ref 70–99)

## 2022-05-17 NOTE — Progress Notes (Signed)
[name] ([id number]) [id number] Page 1 of 9 Visit Report for 05/14/2022 Arrival Information Details Patient Name: Date of Service: [name] RO LD F. 05/14/2022 10:30 A M Medical Record Number: [id number] Patient Account Number: [id number] Date of Birth/Sex: Treating RN: [birth year] (61 y.o. [name] Primary Care [name] Other Clinician: Massie [name] Referring [name]: Treating [name] in Treatment: 15 Visit Information History Since Last Visit All ordered tests and consults were completed: No Patient Arrived: Knee Scooter Added or deleted any medications: No Arrival Time: 10:40 Any new allergies or adverse reactions: No Transfer Assistance: None Had a fall or experienced change in No Patient Requires Transmission-Based Precautions: No activities of daily living that may affect Patient Has Alerts: No risk of falls: Signs or symptoms of abuse/neglect since last visito No Hospitalized since last visit: No Implantable device outside of the clinic excluding No cellular tissue based products placed in the center since last visit: Pain Present Now: No Electronic Signature(s) Signed: 05/16/2022 5:06:55 PM By: [name] Entered By: [name] on 05/14/2022 10:44:30 -------------------------------------------------------------------------------- Clinic Level of Care Assessment Details Patient Name: Date of Service: [name] RO LD F. 05/14/2022 10:30 A M Medical Record Number: [id number] Patient Account Number: [id number] Date of Birth/Sex: Treating RN: 1961/05/30 (61 y.o. [name] Primary Care [name]: [name] Other Clinician: Massie [name] Referring [name]: Treating [name]/Extender: [name] in Treatment: 15 Clinic Level of Care Assessment Items TOOL 1 Quantity Score _0  - 0 Use when EandM and Procedure is performed on  INITIAL visit ASSESSMENTS - Nursing Assessment / Reassessment _1  - 0 General Physical Exam (combine w/ comprehensive assessment (listed just below) when performed on new pt. evals) _2  - 0 Comprehensive Assessment (HX, ROS, Risk Assessments, Wounds Hx, etc.) [name] ([id number]) [id number] Page 2 of 9 ASSESSMENTS - Wound and Skin Assessment / Reassessment _3  - 0 Dermatologic / Skin Assessment (not related to wound area) ASSESSMENTS - Ostomy and/or Continence Assessment and Care _4  - 0 Incontinence Assessment and Management _5  - 0 Ostomy Care Assessment and Management (repouching, etc.) PROCESS - Coordination of Care _6  - 0 Simple Patient / Family Education for ongoing care _7  - 0 Complex (extensive) Patient / Family Education for ongoing care _8  - 0 Staff obtains Programmer, systems, Records, T Results / Process Orders est _9  - 0 Staff telephones HHA, Nursing Homes / Clarify orders / etc _10  - 0 Routine Transfer to another Facility (non-emergent condition) _11  - 0 Routine Hospital Admission (non-emergent condition) _12  - 0 New Admissions / Biomedical engineer / Ordering NPWT Apligraf, etc. , _13  - 0 Emergency Hospital Admission (emergent condition) PROCESS - Special Needs _14  - 0 Pediatric / Minor Patient Management _15  - 0 Isolation Patient Management _16  - 0 Hearing / Language / Visual special needs _17  - 0 Assessment of Community assistance (transportation, D/C planning, etc.) _18  - 0 Additional assistance / Altered mentation _19  - 0 Support Surface(s) Assessment (bed, cushion, seat, etc.) INTERVENTIONS - Miscellaneous _20  - 0 External ear exam _21  - 0 Patient Transfer (multiple staff / Civil Service fast streamer / Similar devices) _22  - 0 Simple Staple / Suture removal (25 or less) _23  - 0 Complex Staple / Suture removal (26 or more) _24  - 0 Hypo/Hyperglycemic Management (do not check if billed separately) _25  - 0 Ankle / Brachial Index (ABI) - do not check  if billed separately Has the patient been seen at the hospital within the last three years:  Yes Total Score: 0 Level Of Care: ____ Electronic Signature(s) Signed: 05/16/2022 5:06:55 PM By: [name] Entered By: [name] on 05/14/2022 11:33:26 -------------------------------------------------------------------------------- Encounter Discharge Information Details Patient Name: Date of Service: [name] RO LD F. 05/14/2022 10:30 A M Medical Record Number: [id number] Patient Account Number: [id number] Date of Birth/Sex: Treating RN: 11-12-1960 (61 y.o. [name] Primary Care [name]: [name] Other Clinician: Massie [name] Referring Ryver Zadrozny: Treating [name]/Extender: [name] in Treatment: [address] [place] F ([id number]) [id number] Page 3 of 9 Encounter Discharge Information Items Post Procedure Vitals Discharge Condition: Stable Temperature (F): 97.8 Ambulatory Status: Knee Scooter Pulse (bpm): 91 Discharge Destination: Home Respiratory Rate (breaths/min): 16 Transportation: Private Auto Blood Pressure (mmHg): 122/68 Accompanied By: self Schedule Follow-up Appointment: Yes Clinical Summary of Care: Electronic Signature(s) Signed: 05/16/2022 5:06:55 PM By: [name] Entered By: [name] on 05/14/2022 11:42:32 -------------------------------------------------------------------------------- Lower Extremity Assessment Details Patient Name: Date of Service: [name] RO LD F. 05/14/2022 10:30 A M Medical Record Number: [id number] Patient Account Number: [id number] Date of Birth/Sex: Treating RN: [birth year] (61 y.o. [name] Primary Care Yovana Scogin: [name] Other Clinician: Massie [name] Referring Glada Wickstrom: Treating Elliette Seabolt/Extender: [name] in Treatment: 15 Electronic Signature(s) Signed: 05/14/2022 5:24:20 PM By: Gretta Cool BSN, RN, CWS, Kim RN,  BSN Signed: 05/16/2022 5:06:55 PM By: [name] Entered By: [name] on 05/14/2022 10:54:58 -------------------------------------------------------------------------------- Multi Wound Chart Details Patient Name: Date of Service: [name] RO LD F. 05/14/2022 10:30 A M Medical Record Number: [id number] Patient Account Number: [id number] Date of Birth/Sex: Treating RN: [birth year] (61 y.o. [name] Primary Care Flavius Repsher: [name] Other Clinician: Massie [name] Referring Tanis Hensarling: Treating Banner Huckaba/Extender: [name] in Treatment: 15 Vital Signs Height(in): 70 Pulse(bpm): 90 Weight(lbs): 255 Blood Pressure(mmHg): 122/68 Body Mass Index(BMI): 36.6 Temperature(F): 97.8 Respiratory Rate(breaths/min): [address] ([id number]) [14:Photos:] [N/A:N/A] Left, Lateral Foot N/A N/A Wound Location: Blister N/A N/A Wounding Event: Diabetic Wound/Ulcer of the Lower N/A N/A Primary Etiology: Extremity Dehisced Wound N/A N/A Secondary Etiology: Hypertension, Type II Diabetes, N/A N/A Comorbid History: Neuropathy 10/29/2021 N/A N/A Date Acquired: 15 N/A N/A Weeks of Treatment: Open N/A N/A Wound Status: No N/A N/A Wound Recurrence: Yes N/A N/A Pending A mputation on Presentation: 3.2x1.8x0.3 N/A N/A Measurements L x W x D (cm) 4.524 N/A N/A A (cm) : rea 1.357 N/A N/A Volume (cm) : 58.10% N/A N/A % Reduction in A rea: 95.00% N/A N/A % Reduction in Volume: Grade 3 N/A N/A Classification: Medium N/A N/A Exudate A mount: Serosanguineous N/A N/A Exudate Type: red, brown N/A N/A Exudate Color: Epibole N/A N/A Wound Margin: Large (67-100%) N/A N/A Granulation A mount: Red N/A N/A Granulation Quality: Small (1-33%) N/A N/A Necrotic A mount: Fat Layer (Subcutaneous Tissue): Yes N/A N/A Exposed Structures: Muscle: Yes Fascia: No Tendon: No Joint: No Bone: No Small (1-33%) N/A N/A Epithelialization: Treatment  Notes Electronic Signature(s) Signed: 05/16/2022 5:06:55 PM By: [name] Entered By: [name] on 05/14/2022 10:55:17 -------------------------------------------------------------------------------- Multi-Disciplinary Care Plan Details Patient Name: Date of Service: [name] RO LD F. 05/14/2022 10:30 A M Medical Record Number: [id number] Patient Account Number: [id number] Date of Birth/Sex: Treating RN: [birth year] (61 y.o. [name] Primary Care Shaylin Blatt: [name] Other Clinician: Massie [name] Referring Mariam Helbert: Treating Kc Sedlak/Extender: [name] in Treatment: [address] Inactive HBO [place], [place] F ([id number]) [id number] Page 5 of 9 Nursing Diagnoses: Anxiety related to feelings  of confinement associated with the hyperbaric oxygen chamber Anxiety related to knowledge deficit of hyperbaric oxygen therapy and treatment procedures Discomfort related to temperature and humidity changes inside hyperbaric chamber Potential for barotraumas to ears, sinuses, teeth, and lungs or cerebral gas embolism related to changes in atmospheric pressure inside hyperbaric oxygen chamber Potential for oxygen toxicity seizures related to delivery of 100% oxygen at an increased atmospheric pressure Potential for pulmonary oxygen toxicity related to delivery of 100% oxygen at an increased atmospheric pressure Goals: Barotrauma will be prevented during HBO2 Date Initiated: 02/11/2022 T arget Resolution Date: 02/11/2022 Goal Status: Active Patient and/or family will be able to state/discuss factors appropriate to the management of their disease process during treatment Date Initiated: 02/11/2022 T arget Resolution Date: 02/11/2022 Goal Status: Active Patient will tolerate the hyperbaric oxygen therapy treatment Date Initiated: 02/11/2022 T arget Resolution Date: 02/11/2022 Goal Status: Active Patient will tolerate the internal  climate of the chamber Date Initiated: 02/11/2022 T arget Resolution Date: 02/12/2022 Goal Status: Active Patient/caregiver will verbalize understanding of HBO goals, rationale, procedures and potential hazards Date Initiated: 02/11/2022 T arget Resolution Date: 02/11/2022 Goal Status: Active Signs and symptoms of pulmonary oxygen toxicity will be recognized and promptly addressed Date Initiated: 02/11/2022 T arget Resolution Date: 02/11/2022 Goal Status: Active Signs and symptoms of seizure will be recognized and promptly addressed ; seizing patients will suffer no harm Date Initiated: 02/11/2022 T arget Resolution Date: 02/11/2022 Goal Status: Active Interventions: Administer a five (5) minute air break for patient if signs and symptoms of seizure appear and notify the hyperbaric physician Administer decongestants, per physician orders, prior to HBO2 Administer the correct therapeutic gas delivery based on the patients needs and limitations, per physician order Assess and provide for patients comfort related to the hyperbaric environment and equalization of middle ear Assess for signs and symptoms related to adverse events, including but not limited to confinement anxiety, pneumothorax, oxygen toxicity and baurotrauma Assess patient for any history of confinement anxiety Assess patient's knowledge and expectations regarding hyperbaric medicine and provide education related to the hyperbaric environment, goals of treatment and prevention of adverse events Implement protocols to decrease risk of pneumothorax in high risk patients Notes: Wound/Skin Impairment Nursing Diagnoses: Knowledge deficit related to ulceration/compromised skin integrity Goals: Patient/caregiver will verbalize understanding of skin care regimen Date Initiated: 01/29/2022 Target Resolution Date: 03/01/2022 Goal Status: Active Ulcer/skin breakdown will have a volume reduction of 30% by week 4 Date Initiated: 01/29/2022 Date  Inactivated: 04/16/2022 Target Resolution Date: 03/01/2022 Goal Status: Unmet Unmet Reason: comorbidties Ulcer/skin breakdown will have a volume reduction of 50% by week 8 Date Initiated: 01/29/2022 Date Inactivated: 04/16/2022 Target Resolution Date: 03/31/2022 Goal Status: Unmet Unmet Reason: comorbidties Ulcer/skin breakdown will have a volume reduction of 80% by week 12 Date Initiated: 01/29/2022 Target Resolution Date: 05/01/2022 Goal Status: Active Ulcer/skin breakdown will heal within 14 weeks Date Initiated: 01/29/2022 Target Resolution Date: 05/01/2022 Goal Status: Active Interventions: Assess patient/caregiver ability to obtain necessary supplies Assess patient/caregiver ability to perform ulcer/skin care regimen upon admission and as needed Assess ulceration(s) every visit Notes: Electronic Signature(s) GERALD, KUEHL ([id number]) [id number] Page 6 of 9 Signed: 05/14/2022 5:24:20 PM By: Gretta Cool, BSN, RN, CWS, Kim RN, BSN Signed: 05/16/2022 5:06:55 PM By: [name] Entered By: [name] on 05/14/2022 10:55:03 -------------------------------------------------------------------------------- Pain Assessment Details Patient Name: Date of Service: [name] RO LD F. 05/14/2022 10:30 A M Medical Record Number: [id number] Patient Account Number: [id number] Date of Birth/Sex: Treating RN: 10-Dec-1960 (61 y.o.  [name] Primary Care Julius Matus: [name] Other Clinician: Massie [name] Referring Claudio Mondry: Treating Lenzie Montesano/Extender: [name] in Treatment: 15 Active Problems Location of Pain Severity and Description of Pain Patient Has Paino No Site Locations Pain Management and Medication Current Pain Management: Electronic Signature(s) Signed: 05/14/2022 5:24:20 PM By: Gretta Cool, BSN, RN, CWS, Kim RN, BSN Signed: 05/16/2022 5:06:55 PM By: [name] Entered By: [name] on 05/14/2022  10:45:24 -------------------------------------------------------------------------------- Patient/Caregiver Education Details Patient Name: Date of Service: [name] RO LD F. 11/14/2023andnbsp10:30 A M Fredricksen, Rodarius F ([id number]) [id number] Page 7 of 9 Medical Record Number: [id number] Patient Account Number: [id number] Date of Birth/Gender: Treating RN: [birth year] (61 y.o. [name] Primary Care Physician: [name] Other Clinician: Massie [name] Referring Physician: Treating Physician/Extender: [name] in Treatment: 15 Education Assessment Education Provided To: Patient Education Topics Provided Wound/Skin Impairment: Handouts: Other: continue wound care as directed Methods: Explain/Verbal Responses: State content correctly Electronic Signature(s) Signed: 05/16/2022 5:06:55 PM By: [name] Entered By: [name] on 05/14/2022 11:33:48 -------------------------------------------------------------------------------- Wound Assessment Details Patient Name: Date of Service: [name] RO LD F. 05/14/2022 10:30 A M Medical Record Number: [id number] Patient Account Number: [id number] Date of Birth/Sex: Treating RN: [birth year] (61 y.o. [name] Primary Care Minnetta Sandora: [name] Other Clinician: Massie [name] Referring Martin Belling: Treating Abeera Flannery/Extender: [name] in Treatment: 15 Wound Status Wound Number: 14 Primary Etiology: Diabetic Wound/Ulcer of the Lower Extremity Wound Location: Left, Lateral Foot Secondary Etiology: Dehisced Wound Wounding Event: Blister Wound Status: Open Date Acquired: 10/29/2021 Comorbid History: Hypertension, Type II Diabetes, Neuropathy Weeks Of Treatment: 15 Clustered Wound: No Pending Amputation On Presentation Photos Wound Measurements Length: (cm) 3.2 Width: (cm) 1.8 Depth: (cm) 0.3 Area: (cm) 4.5 VANE, YAPP  ([id number]) Volume: (cm) % Reduction in Area: 58.1% % Reduction in Volume: 95% Epithelialization: Small (1-33%) 24 Tunneling: No [id number] Page 8 of 9 1.357 Undermining: No Wound Description Classification: Grade 3 Wound Margin: Epibole Exudate Amount: Medium Exudate Type: Serosanguineous Exudate Color: red, brown Foul Odor After Cleansing: No Slough/Fibrino Yes Wound Bed Granulation Amount: Large (67-100%) Exposed Structure Granulation Quality: Red Fascia Exposed: No Necrotic Amount: Small (1-33%) Fat Layer (Subcutaneous Tissue) Exposed: Yes Necrotic Quality: Adherent Slough Tendon Exposed: No Muscle Exposed: Yes Necrosis of Muscle: No Joint Exposed: No Bone Exposed: No Treatment Notes Wound #14 (Foot) Wound Laterality: Left, Lateral Cleanser [place] Ancillary Kit - 15 Day Supply Discharge Instruction: Use supplies as instructed; Kit contains: (15) Saline Bullets; (15) 3x3 Gauze; 15 pr Gloves Wound Cleanser Discharge Instruction: Wash your hands with soap and water. Remove old dressing, discard into plastic bag and place into trash. Cleanse the wound with Wound Cleanser prior to applying a clean dressing using gauze sponges, not tissues or cotton balls. Do not scrub or use excessive force. Pat dry using gauze sponges, not tissue or cotton balls. Peri-Wound Care Topical Primary Dressing Hydrofera Blue Ready Transfer Foam, 4x5 (in/in) Discharge Instruction: Apply Hydrofera Blue Ready to wound bed as directed Secondary Dressing ABD Pad 5x9 (in/in) Discharge Instruction: Cover with ABD pad Secured With [employer] Soft Cloth Surgical T ape ape, 2x2 (in/yd) Kerlix Roll Sterile or Non-Sterile 6-ply 4.5x4 (yd/yd) Discharge Instruction: Apply Kerlix as directed Compression Wrap Compression Stockings Add-Ons Electronic Signature(s) Signed: 05/14/2022 5:24:20 PM By: Gretta Cool, BSN, RN, CWS, Kim RN, BSN Signed: 05/16/2022 5:06:55 PM  By: [name] Entered By: [name] on 05/14/2022 10:53:23 -------------------------------------------------------------------------------- Vitals  Details Patient Name: Date of Service: [name] RO LD F. 05/14/2022 10:30 A KEION, NEELS ([id number]) [id number] Page 9 of 9 Medical Record Number: [id number] Patient Account Number: [id number] Date of Birth/Sex: Treating RN: [birth year] (61 y.o. [name] Primary Care Brenn Deziel: [name] Other Clinician: Massie [name] Referring Elanore Talcott: Treating Rileigh Kawashima/Extender: [name] in Treatment: 15 Vital Signs Time Taken: 10:44 Temperature (F): 97.8 Height (in): 70 Pulse (bpm): 90 Weight (lbs): 255 Respiratory Rate (breaths/min): 16 Body Mass Index (BMI): 36.6 Blood Pressure (mmHg): 122/68 Reference Range: 80 - 120 mg / dl Electronic Signature(s) Signed: 05/16/2022 5:06:55 PM By: [name] Entered By: [name] on 05/14/2022 10:44:54

## 2022-05-17 NOTE — Progress Notes (Signed)
DIVIT, STIPP ([id number]) [id number] Page 1 of 2 Visit Report for 05/17/2022 Arrival Information Details Patient Name: Date of Service: [name] RO LD F. 05/17/2022 8:00 A M Medical Record Number: [id number] Patient Account Number: [id number] Date of Birth/Sex: Treating RN: October 17, 1960 (61 y.o. [name], [name] Primary Care [name]: [name] Other Clinician: Jacqulyn [name] Referring [name]: Treating [name]/Extender: [name] in Treatment: 15 Visit Information History Since Last Visit Added or deleted any medications: No Patient Arrived: Knee Scooter Any new allergies or adverse reactions: No Arrival Time: 08:05 Had a fall or experienced change in No Accompanied By: self activities of daily living that may affect Transfer Assistance: None risk of falls: Patient Identification Verified: Yes Signs or symptoms of abuse/neglect since No Secondary Verification Process Completed: Yes last visito Patient Requires Transmission-Based Precautions: No Hospitalized since last visit: No Patient Has Alerts: No Implantable device outside of the clinic No excluding cellular tissue based products placed in the center since last visit: Has Dressing in Place as Prescribed: Yes Has Footwear/Offloading in Place as Yes Prescribed: Left: Surgical Shoe with Pressure Relief Insole Pain Present Now: No Electronic Signature(s) Signed: 05/17/2022 10:32:04 AM By: [name] [name], RRT CHT , , Entered By: [name] on 05/17/2022 08:34:53 , -------------------------------------------------------------------------------- Encounter Discharge Information Details Patient Name: Date of Service: [name] RO LD F. 05/17/2022 8:00 A M Medical Record Number: [id number] Patient Account Number: [id number] Date of Birth/Sex: Treating RN: [birth year] (61 y.o. [name], [name] Primary Care [name]: [name] Other  Clinician: Jacqulyn [name] Referring [name]: Treating [name]/Extender: [name] in Treatment: 15 Encounter Discharge Information Items Discharge Condition: Stable Ambulatory Status: Knee Scooter Discharge Destination: Home [name], [name] ([id number]) [id number] Page 2 of 2 Transportation: Private Auto Accompanied By: self Schedule Follow-up Appointment: Yes Clinical Summary of Care: Notes Patient has an HBO treatment scheduled on 05/20/22 at 08:00 am. Electronic Signature(s) Signed: 05/17/2022 10:32:04 AM By: [name] [name], RRT CHT , , Entered By: [name] on 05/17/2022 10:31:33 , -------------------------------------------------------------------------------- Vitals Details Patient Name: Date of Service: [name] RO LD F. 05/17/2022 8:00 A M Medical Record Number: [id number] Patient Account Number: [id number] Date of Birth/Sex: Treating RN: [birth year] (61 y.o. [name], [name] Primary Care Alma Muegge: [name] Other Clinician: Jacqulyn [name] Referring Safire Gordin: Treating Makeshia Seat/Extender: [name] in Treatment: 15 Vital Signs Time Taken: 08:08 Temperature (F): 97.8 Height (in): 70 Pulse (bpm): 72 Weight (lbs): 255 Respiratory Rate (breaths/min): 16 Body Mass Index (BMI): 36.6 Blood Pressure (mmHg): 112/68 Capillary Blood Glucose (mg/dl): 159 Reference Range: 80 - 120 mg / dl Electronic Signature(s) Signed: 05/17/2022 10:32:04 AM By: [name] [name], RRT CHT , , Entered By: [name] on 05/17/2022 08:41:37 ,

## 2022-05-17 NOTE — Progress Notes (Signed)
[name] ([id number]) [id number] Page 1 of 2 Visit Report for 05/17/2022 HBO Details Patient Name: Date of Service: [name] RO LD F. 05/17/2022 8:00 A M Medical Record Number: [id number] Patient Account Number: [id number] Date of Birth/Sex: Treating RN: [birth year] (61 y.o. 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 HBO Treatment Details Treatment Number: 55 Patient Type: Outpatient Chamber Type: Monoplace Chamber Serial #: [id number] Treatment Protocol: 2.0 ATA with 90 minutes oxygen, and no air breaks Treatment Details Compression Rate Down: 1.5 psi / minute De-Compression Rate Up: 2.0 psi / minute Air breaks and breathing Decompress Decompress Compress Tx Pressure Begins Reached periods Begins Ends (leave unused spaces blank) Chamber Pressure (ATA 1 2 ------2 1 ) Clock Time (24 hr) 08:13 08:24 - - - - - - 09:54 10:02 Treatment Length: 109 (minutes) Treatment Segments: 4 Vital Signs Capillary Blood Glucose Reference Range: 80 - 120 mg / dl HBO Diabetic Blood Glucose Intervention Range: <131 mg/dl or >249 mg/dl Type: Time Vitals Blood Respiratory Capillary Blood Glucose Pulse Action Pulse: Temperature: Taken: Pressure: Rate: Glucose (mg/dl): Meter #: Oximetry (%) Taken: Pre 08:08 112/68 72 16 97.8 159 1 none per protocol Post 10:13 111/80 72 16 98 205 1 none per protocol Treatment Response Treatment Toleration: Well Treatment Completion Status: Treatment Completed without Adverse Event Electronic Signature(s) Signed: 05/17/2022 10:32:04 AM By: [name] RCP,  RRT [name] , , Signed: 05/17/2022 2:32:55 PM By: [name] Keeler PA-C Entered By: [name] on 05/17/2022 10:30:36 , [name] ([id number]) [id number] Page 2 of 2 -------------------------------------------------------------------------------- HBO Safety Checklist Details Patient Name: Date of Service: [name] RO LD F. 05/17/2022 8:00 A M Medical Record Number: [id number] Patient Account Number: [id number] Date of Birth/Sex: Treating RN: 04/10/1961 (61 y.o. [name], [name] Primary Care [name]: [name] Other Clinician: Jacqulyn [name] Referring [name]: Treating [name]/Extender: [name] in Treatment: 15 HBO Safety Checklist Items Safety Checklist Consent Form Signed Patient voided / foley secured and emptied When did you last eato 07:00 am Last dose of injectable or oral agent 05/16/22 pm Ostomy pouch emptied and vented if applicable NA All implantable devices assessed, documented and approved NA Intravenous access site secured and place NA Valuables secured Linens and cotton and cotton/polyester blend (less than 51% polyester) Personal oil-based products / skin lotions / body lotions removed Wigs or hairpieces removed NA Smoking or tobacco materials removed NA Books / newspapers / magazines / loose paper removed NA Cologne, aftershave, perfume and deodorant removed Jewelry removed (may wrap wedding band) Make-up removed NA Hair care products removed Battery operated devices (external) removed NA Heating patches and chemical warmers removed NA Titanium eyewear removed NA Nail polish cured greater than 10 hours NA Casting material cured greater than 10 hours NA Hearing aids removed NA Loose dentures or partials removed NA Prosthetics have been removed NA Patient demonstrates correct use of air break device (if applicable) Patient concerns have been addressed Patient grounding  bracelet on and cord attached to chamber Specifics for Inpatients (complete in addition to above) Medication sheet sent with patient Intravenous medications needed or due during therapy sent with patient Drainage tubes (e.g. nasogastric tube or chest tube secured and  vented) Endotracheal or Tracheotomy tube secured Cuff deflated of air and inflated with saline Airway suctioned Electronic Signature(s) Signed: 05/17/2022 10:32:04 AM By: [name] , , Entered By: [name] on 05/17/2022 08:43:18 ,

## 2022-05-17 NOTE — Progress Notes (Signed)
[name] ([id number]) [id number] Page 1 of 2 Visit Report for 05/16/2022 HBO Details Patient Name: Date of Service: [name] RO LD F. 05/16/2022 8:00 A M Medical Record Number: [id number] Patient Account Number: [id number] Date of Birth/Sex: Treating RN: [birth year] (61 y.o. 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 HBO Treatment Details Treatment Number: 54 Patient Type: Outpatient Chamber Type: Monoplace Chamber Serial #: [id number] Treatment Protocol: 2.0 ATA with 90 minutes oxygen, and no air breaks Treatment Details Compression Rate Down: 1.5 psi / minute De-Compression Rate Up: 1.5 psi / minute Air breaks and breathing Decompress Decompress Compress Tx Pressure Begins Reached periods Begins Ends (leave unused spaces blank) Chamber Pressure (ATA 1 2 ------2 1 ) Clock Time (24 hr) 08:45 08:55 - - - - - - 10:25 10:35 Treatment Length: 110 (minutes) Treatment Segments: 4 Vital Signs Capillary Blood Glucose Reference Range: 80 - 120 mg / dl HBO Diabetic Blood Glucose Intervention Range: <131 mg/dl or >249 mg/dl Type: Time Vitals Blood Respiratory Capillary Blood Glucose Pulse Action Pulse: Temperature: Taken: Pressure: Rate: Glucose (mg/dl): Meter #: Oximetry (%) Taken: Pre 08:39 118/68 72 16 97.9 208 1 none per protocol Post 10:47 116/74 72 16 98.3 148 1 none per protocol Treatment Response Treatment Toleration: Well Treatment Completion Status: Treatment Completed without Adverse Event Electronic Signature(s) Signed: 05/16/2022 11:09:11 AM By: [name]  RCP, RRT CHT , , Signed: 05/17/2022 2:32:55 PM By: [name] Keeler PA-C Entered By: [name] on 05/16/2022 11:06:02 , [name] ([id number]) [id number] Page 2 of 2 -------------------------------------------------------------------------------- HBO Safety Checklist Details Patient Name: Date of Service: [name] RO LD F. 05/16/2022 8:00 A M Medical Record Number: [id number] Patient Account Number: [id number] Date of Birth/Sex: Treating RN: 1960/10/31 (61 y.o. [name], [name] Primary Care [name]: [name] Other Clinician: Jacqulyn [name] Referring [name]: Treating [name]/Extender: [name] in Treatment: 15 HBO Safety Checklist Items Safety Checklist Consent Form Signed Patient voided / foley secured and emptied When did you last eato 7:00 am Last dose of injectable or oral agent 05/15/22 pm Ostomy pouch emptied and vented if applicable NA All implantable devices assessed, documented and approved NA Intravenous access site secured and place NA Valuables secured Linens and cotton and cotton/polyester blend (less than 51% polyester) Personal oil-based products / skin lotions / body lotions removed Wigs or hairpieces removed NA Smoking or tobacco materials removed NA Books / newspapers / magazines / loose paper removed NA Cologne, aftershave, perfume and deodorant removed Jewelry removed (may wrap wedding band) Make-up removed NA Hair care products removed Battery operated devices (external) removed NA Heating patches and chemical warmers removed NA Titanium eyewear removed NA Nail polish cured greater than 10 hours NA Casting material cured greater than 10 hours NA Hearing aids removed NA Loose dentures or partials removed NA Prosthetics have been removed NA Patient demonstrates correct use of air break device (if applicable) Patient concerns have been addressed Patient grounding  bracelet on and cord attached to chamber Specifics for Inpatients (complete in addition to above) Medication sheet sent with patient Intravenous medications needed or due during therapy sent with patient Drainage tubes (e.g. nasogastric tube or chest tube secured and  vented) Endotracheal or Tracheotomy tube secured Cuff deflated of air and inflated with saline Airway suctioned Electronic Signature(s) Signed: 05/16/2022 11:09:11 AM By: [name] RCP, RRT CHT , , Entered By: [name] on 05/16/2022 08:52:00 ,

## 2022-05-20 ENCOUNTER — Encounter: Payer: 59 | Admitting: Physician Assistant

## 2022-05-20 DIAGNOSIS — M86372 Chronic multifocal osteomyelitis, left ankle and foot: Secondary | ICD-10-CM | POA: Diagnosis not present

## 2022-05-20 LAB — GLUCOSE, CAPILLARY
Glucose-Capillary: 224 mg/dL — ABNORMAL HIGH (ref 70–99)
Glucose-Capillary: 226 mg/dL — ABNORMAL HIGH (ref 70–99)

## 2022-05-20 NOTE — Progress Notes (Signed)
[name] ([id number]) [id number] Page 1 of 2 Visit Report for 05/20/2022 Arrival Information Details Patient Name: Date of Service: [name] RO LD F. 05/20/2022 8:00 A M Medical Record Number: [id number] Patient Account Number: [id number] Date of Birth/Sex: Treating RN: 1961-01-13 (61 y.o. [name]) Carlene Coria Primary Care Gailen Venne: [name] Other Clinician: [name] Referring Dior Dominik: Treating Charlayne Vultaggio/Extender: [name] in Treatment: 15 Visit Information History Since Last Visit Added or deleted any medications: No Patient Arrived: Knee Scooter Any new allergies or adverse reactions: No Arrival Time: 08:00 Had a fall or experienced change in No Accompanied By: self activities of daily living that may affect Transfer Assistance: None risk of falls: Patient Requires Transmission-Based Precautions: No Signs or symptoms of abuse/neglect since No Patient Has Alerts: No last visito Hospitalized since last visit: No Implantable device outside of the clinic No excluding cellular tissue based products placed in the center since last visit: Has Dressing in Place as Prescribed: Yes Has Footwear/Offloading in Place as Yes Prescribed: Left: Surgical Shoe with Pressure Relief Insole Pain Present Now: No Electronic Signature(s) Signed: 05/20/2022 11:13:20 AM By: [name] RCP, RRT CHT , , Entered By: [name] on 05/20/2022 08:46:48 , -------------------------------------------------------------------------------- Encounter Discharge Information Details Patient Name: Date of Service: [name] RO LD F. 05/20/2022 8:00 A M Medical Record Number: [id number] Patient Account Number: [id number] Date of Birth/Sex: Treating RN: 09/07/1960 (61 y.o. [name]) Carlene Coria Primary Care Tashaun Obey: [name] Other Clinician: [name] Referring Charnese Federici: Treating Iman Reinertsen/Extender: [name] in Treatment: 15 Encounter Discharge Information Items Discharge Condition: Stable Ambulatory Status: Knee Scooter Discharge Destination: Home [name] ([id number]) [id number] Page 2 of 2 Transportation: Private Auto Accompanied By: self Schedule Follow-up Appointment: Yes Clinical Summary of Care: Notes Patient has an HBO treatment scheduled on 05/21/22 at 09:00 am. Electronic Signature(s) Signed: 05/20/2022 11:13:20 AM By: [name] RCP, RRT CHT , , Entered By: [name] on 05/20/2022 11:12:53 , -------------------------------------------------------------------------------- Vitals Details Patient Name: Date of Service: [name] RO LD F. 05/20/2022 8:00 A M Medical Record Number: [id number] Patient Account Number: [id number] Date of Birth/Sex: Treating RN: July 29, 1960 (61 y.o. [name]) Carlene Coria Primary Care Kailash Hinze: [name] Other Clinician: [name] Referring Jalien Weakland: Treating Kadi Hession/Extender: [name] in Treatment: 15 Vital Signs Time Taken: 08:04 Temperature (F): 97.8 Height (in): 70 Pulse (bpm): 72 Weight (lbs): 255 Respiratory Rate (breaths/min): 18 Body Mass Index (BMI): 36.6 Blood Pressure (mmHg): 118/70 Capillary Blood Glucose (mg/dl): 224 Reference Range: 80 - 120 mg / dl Electronic Signature(s) Signed: 05/20/2022 11:13:20 AM By: [name] RCP, RRT CHT , , Entered By: [name] on 05/20/2022 08:48:02 ,

## 2022-05-20 NOTE — Progress Notes (Signed)
[name], [name] ([id number]) [id number] Page 1 of 2 Visit Report for 05/20/2022 Problem List Details Patient Name: Date of Service: [name] RO LD F. 05/20/2022 8:00 A M Medical Record Number: [id number] Patient Account Number: [id number] Date of Birth/Sex: Treating RN: 09/11/1960 (61 y.o. 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 05/20/2022 Medical Record Number: [id number] Patient Account Number: [id number] [name], [name] ([id number]) [id number] Page 2 of 2 Date of Birth/Sex: Treating RN: 1960/10/26 (61 y.o. [name]) Carlene Coria Primary Care Provider: [name] Other Clinician: [name] Referring Provider: Treating Provider/Extender: [name] in Treatment: 15 Diagnosis Coding ICD-10 Codes Code Description [phone] Chronic multifocal osteomyelitis, left ankle and foot E11.621 Type 2 diabetes mellitus with foot ulcer L97.524 Non-pressure chronic ulcer of other part of left foot with necrosis of  bone T81.31XA Disruption of external operation (surgical) wound, not elsewhere classified, initial encounter Facility Procedures : CPT4 Code: [phone] Description: (Facility Use Only) HBOT full body chamber, 45mn , Modifier: Quantity: 4 Physician Procedures : CPT4 Code Description Modifier [id number] PHYS HYPERBARIC OXYGEN THERAPY ICD-10 Diagnosis Description M86.372 Chronic multifocal osteomyelitis, left ankle and foot L97.524 Non-pressure chronic ulcer of other part of left foot with necrosis of  bone E11.621 Type 2 diabetes mellitus with foot ulcer Quantity: 1 Electronic Signature(s) Signed: 05/20/2022 4:09:25 PM By: SWorthy KeelerPA-C Previous Signature: 05/20/2022 11:13:20 AM Version By: WEnedina FinnerRCP, RRT CHT , , Entered By: SWorthy Keeleron 05/20/2022 16:09:24

## 2022-05-20 NOTE — Progress Notes (Signed)
HARBOR, VANOVER ([id number]) [id number] Page 1 of 2 Visit Report for 05/20/2022 HBO Details Patient Name: Date of Service: [name] RO LD F. 05/20/2022 8:00 A M Medical Record Number: [id number] Patient Account Number: [id number] Date of Birth/Sex: Treating RN: 1960/07/12 (61 y.o. [name]) [name] Primary Care [name]: [name] Other Clinician: Jacqulyn [name] Referring [name]: Treating [name]/Extender: [name] in Treatment: 15 HBO Treatment Course Details Treatment Course Number: 1 Ordering [name]: [name] Treatments Ordered: otal 60 HBO Treatment Start Date: 02/18/2022 HBO Indication: Chronic Refractory Osteomyelitis to Left Ankle and Foot HBO Treatment Details Treatment Number: 56 Patient Type: Outpatient Chamber Type: Monoplace Chamber Serial #: [id number] Treatment Protocol: 2.0 ATA with 90 minutes oxygen, and no air breaks Treatment Details Compression Rate Down: 1.5 psi / minute De-Compression Rate Up: 2.0 psi / minute Air breaks and breathing Decompress Decompress Compress Tx Pressure Begins Reached periods Begins Ends (leave unused spaces blank) Chamber Pressure (ATA 1 2 ------2 1 ) Clock Time (24 hr) 08:29 08:39 - - - - - - 10:09 10:17 Treatment Length: 108 (minutes) Treatment Segments: 4 Vital Signs Capillary Blood Glucose Reference Range: 80 - 120 mg / dl HBO Diabetic Blood Glucose Intervention Range: <131 mg/dl or >249 mg/dl Type: Time Vitals Blood Respiratory Capillary Blood Glucose Pulse Action Pulse: Temperature: Taken: Pressure: Rate: Glucose (mg/dl): Meter #: Oximetry (%) Taken: Pre 08:04 118/70 72 18 97.8 224 1 none per protocol Post 10:32 112/80 72 16 97.8 226 1 none per protocol Treatment Response Treatment Toleration: Well Treatment Completion Status: Treatment Completed without Adverse Event HBO Attestation I certify that I supervised this HBO treatment in accordance with  Medicare guidelines. A trained emergency response team is readily available per Yes hospital policies and procedures. Continue HBOT as ordered. Yes Electronic Signature(s) Signed: 05/20/2022 4:09:18 PM By: [name] Keeler PA-C Previous Signature: 05/20/2022 11:13:20 AM Version By: [name] RCP, RRT CHT , , Entered By: [name] on 05/20/2022 16:09:18 [name] ([id number]) [id number] Page 2 of 2 -------------------------------------------------------------------------------- HBO Safety Checklist Details Patient Name: Date of Service: [name] RO LD F. 05/20/2022 8:00 A M Medical Record Number: [id number] Patient Account Number: [id number] Date of Birth/Sex: Treating RN: 17-Feb-1961 (61 y.o. [name]) [name] Primary Care [name]: [name] Other Clinician: Jacqulyn [name] Referring [name]: Treating [name]: [name] in Treatment: 15 HBO Safety Checklist Items Safety Checklist Consent Form Signed Patient voided / foley secured and emptied When did you last eato 07:00 am Last dose of injectable or oral agent 05/19/22 pm Ostomy pouch emptied and vented if applicable NA All implantable devices assessed, documented and approved NA Intravenous access site secured and place NA Valuables secured Linens and cotton and cotton/polyester blend (less than 51% polyester) Personal oil-based products / skin lotions / body lotions removed Wigs or hairpieces removed NA Smoking or tobacco materials removed NA Books / newspapers / magazines / loose paper removed NA Cologne, aftershave, perfume and deodorant removed Jewelry removed (may wrap wedding band) Make-up removed NA Hair care products removed Battery operated devices (external) removed NA Heating patches and chemical warmers removed NA Titanium eyewear removed NA Nail polish cured greater than 10 hours NA Casting material cured greater  than 10 hours NA Hearing aids removed NA Loose dentures or partials removed NA Prosthetics have been removed NA Patient demonstrates correct use of air break device (if applicable) Patient concerns have been addressed Patient grounding bracelet on and cord attached  to chamber Specifics for Inpatients (complete in addition to above) Medication sheet sent with patient Intravenous medications needed or due during therapy sent with patient Drainage tubes (e.g. nasogastric tube or chest tube secured and vented) Endotracheal or Tracheotomy tube secured Cuff deflated of air and inflated with saline Airway suctioned Electronic Signature(s) Signed: 05/20/2022 11:13:20 AM By: [name] RCP, RRT CHT , , Entered By: [name] on 05/20/2022 08:49:17 ,

## 2022-05-21 ENCOUNTER — Encounter: Payer: 59 | Admitting: Physician Assistant

## 2022-05-21 DIAGNOSIS — M86372 Chronic multifocal osteomyelitis, left ankle and foot: Secondary | ICD-10-CM | POA: Diagnosis not present

## 2022-05-21 LAB — GLUCOSE, CAPILLARY
Glucose-Capillary: 290 mg/dL — ABNORMAL HIGH (ref 70–99)
Glucose-Capillary: 260 mg/dL — ABNORMAL HIGH (ref 70–99)

## 2022-05-21 NOTE — Progress Notes (Signed)
DATON, SZILAGYI ([id number]) [id number] Page 1 of 2 Visit Report for 05/21/2022 HBO Details Patient Name: Date of Service: Donnalee Curry RO LD F. 05/21/2022 8:00 A M Medical Record Number: [id number] Patient Account Number: [id number] Date of Birth/Sex: Treating RN: 12-May-1961 (61 y.o. 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 Foot HBO Treatment 05/21/2022 End Date: HBO Discharge Treatment Series Complete; Wound Progress Plateaued / Outcome: Significant Improvement HBO Treatment Details Treatment Number: 57 Patient Type: Outpatient Chamber Type: Monoplace Chamber Serial #: [id number] Treatment Protocol: 2.0 ATA with 90 minutes oxygen, and no air breaks Treatment Details Compression Rate Down: 1.5 psi / minute De-Compression Rate Up: 2.0 psi / minute Air breaks and breathing Decompress Decompress Compress Tx Pressure Begins Reached periods Begins Ends (leave unused spaces blank) Chamber Pressure (ATA 1 2 ------2 1 ) Clock Time (24 hr) 08:17 08:27 - - - - - - 09:57 10:05 Treatment Length: 108 (minutes) Treatment Segments: 4 Vital Signs Capillary Blood Glucose Reference Range: 80 - 120 mg / dl HBO Diabetic Blood Glucose Intervention Range: <131 mg/dl or >249 mg/dl Type: Time Vitals Blood Respiratory Capillary Blood Glucose Pulse Action Pulse: Temperature: Taken: Pressure: Rate: Glucose (mg/dl): Meter #: Oximetry (%) Taken: Pre 08:10 108/64 78 16 97.8 290 1 none per protocol Post 10:26 118/72 72 16 97.8 260 1 none per protocol Treatment Response Treatment Toleration: Well Treatment  Completion Status: Treatment Completed without Adverse Event Electronic Signature(s) Signed: 05/21/2022 2:11:08 PM By: [name] RCP, RRT CHT , , Signed: 05/21/2022 5:41:33 PM By: Worthy Keeler PA-C Entered By: [name] on 05/21/2022 14:07:34 , [name] ([id number]) [id number] Page 2 of 2 -------------------------------------------------------------------------------- HBO Safety Checklist Details Patient Name: Date of Service: Donnalee Curry RO LD F. 05/21/2022 8:00 A M Medical Record Number: [id number] Patient Account Number: [id number] Date of Birth/Sex: Treating RN: 1961-05-23 (61 y.o. [name], [name] Primary Care Abdirizak Richison: [name] Other Clinician: [name] Referring Promise Bushong: Treating Vanetta Rule/Extender: [name] in Treatment: 16 HBO Safety Checklist Items Safety Checklist Consent Form Signed Patient voided / foley secured and emptied When did you last eato 07:00 am Last dose of injectable or oral agent 05/20/22 Ostomy pouch emptied and vented if applicable NA All implantable devices assessed, documented and approved NA Intravenous access site secured and place NA Valuables secured Linens and cotton and cotton/polyester blend (less than 51% polyester) Personal oil-based products / skin lotions / body lotions removed Wigs or hairpieces removed NA Smoking or tobacco materials removed NA Books / newspapers / magazines / loose paper removed NA Cologne, aftershave, perfume and deodorant removed Jewelry removed (may wrap wedding band) Make-up removed NA Hair care products removed Battery operated devices (external) removed NA Heating patches and chemical warmers removed NA Titanium eyewear removed NA Nail polish cured greater than 10 hours NA Casting material cured greater than 10 hours NA Hearing aids removed NA Loose dentures or partials removed NA Prosthetics have  been removed NA Patient demonstrates correct use of air break device (if applicable) Patient concerns have been addressed Patient grounding bracelet on and cord attached to chamber Specifics for Inpatients (complete in addition to above) Medication sheet sent with patient Intravenous medications needed or  due during therapy sent with patient Drainage tubes (e.g. nasogastric tube or chest tube secured and vented) Endotracheal or Tracheotomy tube secured Cuff deflated of air and inflated with saline Airway suctioned Electronic Signature(s) Signed: 05/21/2022 2:11:08 PM By: [name] RCP, RRT CHT , , Entered By: [name] on 05/21/2022 08:48:52 ,

## 2022-05-21 NOTE — Progress Notes (Addendum)
QUEST, TAVENNER ([id number]) [id number] Page 1 of 9 Visit Report for 05/21/2022 Arrival Information Details Patient Name: Date of Service: [name] RO LD F. 05/21/2022 10:30 A M Medical Record Number: [id number] Patient Account Number: [id number] Date of Birth/Sex: Treating RN: 02/04/1961 (61 y.o. [name] Primary Care Deagen Krass: [name] Other Clinician: Massie Kluver Referring Laura Radilla: Treating Keeara Frees/Extender: [name] in Treatment: 61 Visit Information History Since Last Visit All ordered tests and consults were completed: No Patient Arrived: Knee Scooter Added or deleted any medications: No Arrival Time: 10:54 Any new allergies or adverse reactions: No Transfer Assistance: None Had a fall or experienced change in No Patient Identification Verified: Yes activities of daily living that may affect Secondary Verification Process Completed: Yes risk of falls: Patient Requires Transmission-Based Precautions: No Signs or symptoms of abuse/neglect since No Patient Has Alerts: No last visito Hospitalized since last visit: No Implantable device outside of the clinic No excluding cellular tissue based products placed in the center since last visit: Has Dressing in Place as Prescribed: Yes Has Footwear/Offloading in Place as Yes Prescribed: Left: Surgical Shoe with Pressure Relief Insole Pain Present Now: No Electronic Signature(s) Signed: 05/21/2022 5:00:42 PM By: Massie Kluver Entered By: Massie Kluver on 05/21/2022 10:55:30 -------------------------------------------------------------------------------- Clinic Level of Care Assessment Details Patient Name: Date of Service: [name] RO LD F. 05/21/2022 10:30 A M Medical Record Number: [id number] Patient Account Number: [id number] Date of Birth/Sex: Treating RN: 1961-05-30 (61 y.o. [name] Primary Care Gena Laski: [name] Other Clinician:  Massie Kluver Referring Ellisha Bankson: Treating Terril Chestnut/Extender: [name] in Treatment: [facility] Level of Care Assessment Items TOOL 1 Quantity Score NAVON, KOTOWSKI ([id number]) [id number] Page 2 of 9 _0  - 0 Use when EandM and Procedure is performed on INITIAL visit ASSESSMENTS - Nursing Assessment / Reassessment _1  - 0 General Physical Exam (combine w/ comprehensive assessment (listed just below) when performed on new pt. evals) _2  - 0 Comprehensive Assessment (HX, ROS, Risk Assessments, Wounds Hx, etc.) ASSESSMENTS - Wound and Skin Assessment / Reassessment _3  - 0 Dermatologic / Skin Assessment (not related to wound area) ASSESSMENTS - Ostomy and/or Continence Assessment and Care _4  - 0 Incontinence Assessment and Management _5  - 0 Ostomy Care Assessment and Management (repouching, etc.) PROCESS - Coordination of Care _6  - 0 Simple Patient / Family Education for ongoing care _7  - 0 Complex (extensive) Patient / Family Education for ongoing care _8  - 0 Staff obtains Programmer, systems, Records, T Results / Process Orders est _9  - 0 Staff telephones HHA, Nursing Homes / Clarify orders / etc _10  - 0 Routine Transfer to another Facility (non-emergent condition) _11  - 0 Routine Hospital Admission (non-emergent condition) _12  - 0 New Admissions / Biomedical engineer / Ordering NPWT Apligraf, etc. , _13  - 0 Emergency Hospital Admission (emergent condition) PROCESS - Special Needs _14  - 0 Pediatric / Minor Patient Management _15  - 0 Isolation Patient Management _16  - 0 Hearing / Language / Visual special needs _17  - 0 Assessment of Community assistance (transportation, D/C planning, etc.) _18  - 0 Additional assistance / Altered mentation _19  - 0 Support Surface(s) Assessment (bed, cushion, seat, etc.) INTERVENTIONS - Miscellaneous _20  - 0 External ear exam _21  - 0 Patient Transfer (multiple staff / Civil Service fast streamer / Similar devices) _22   - 0 Simple Staple / Suture removal (25 or less) _23  - 0 Complex Staple / Suture removal (26 or more) _24  - 0 Hypo/Hyperglycemic Management (do not check if  billed separately) _0  - 0 Ankle / Brachial Index (ABI) - do not check if billed separately Has the patient been seen at the hospital within the last three years: Yes Total Score: 0 Level Of Care: ____ Electronic Signature(s) Signed: 05/21/2022 5:00:42 PM By: Massie Kluver Entered By: Massie Kluver on 05/21/2022 11:29:58 Encounter Discharge Information Details -------------------------------------------------------------------------------- [name] ([id number]) [id number] Page 3 of 9 Patient Name: Date of Service: [name] RO LD F. 05/21/2022 10:30 A M Medical Record Number: [id number] Patient Account Number: [id number] Date of Birth/Sex: Treating RN: [birth year] (61 y.o. [name] Primary Care Angele Wiemann: [name] Other Clinician: Massie Kluver Referring Lillan Mccreadie: Treating Marialena Wollen/Extender: [name] in Treatment: 16 Encounter Discharge Information Items Post Procedure Vitals Discharge Condition: Stable Temperature (F): 97.7 Ambulatory Status: Knee Scooter Pulse (bpm): 60 Discharge Destination: Home Respiratory Rate (breaths/min): 18 Transportation: Private Auto Blood Pressure (mmHg): 124/84 Accompanied By: self Schedule Follow-up Appointment: Yes Clinical Summary of Care: Electronic Signature(s) Signed: 05/21/2022 5:00:42 PM By: Massie Kluver Entered By: Massie Kluver on 05/21/2022 12:09:05 -------------------------------------------------------------------------------- Lower Extremity Assessment Details Patient Name: Date of Service: [name] RO LD F. 05/21/2022 10:30 A M Medical Record Number: [id number] Patient Account Number: [id number] Date of Birth/Sex: Treating RN: Nov 20, 1960 (61 y.o. [name] Primary Care Georgine Wiltse: [name] Other Clinician: Massie Kluver Referring Timmya Blazier: Treating Mc Hollen/Extender: [name] in Treatment: 16 Electronic Signature(s) Signed: 05/21/2022 5:00:42 PM By: Massie Kluver Signed: 05/22/2022 5:38:08 PM By: Gretta Cool BSN, RN, CWS, Kim RN, BSN Entered By: Massie Kluver on 05/21/2022 11:06:58 -------------------------------------------------------------------------------- Multi Wound Chart Details Patient Name: Date of Service: [name] RO LD F. 05/21/2022 10:30 A M Medical Record Number: [id number] Patient Account Number: [id number] Date of Birth/Sex: Treating RN: Nov 28, 1960 (61 y.o. [name] Primary Care Ronnett Pullin: [name] Other Clinician: Massie Kluver Referring Derald Lorge: Treating Jelene Albano/Extender: [name] in Treatment: 16 Vital Signs Height(in): 70 Pulse(bpm): 72 Weight(lbs): 255 Blood Pressure(mmHg): 118/72 DESMEN, SCHOFFSTALL ([id number]) [phone].pdf Page 4 of 9 Body Mass Index(BMI): 36.6 Temperature(F): 97.8 Respiratory Rate(breaths/min): 16 [14:Photos:] [N/A:N/A] Left, Lateral Foot N/A N/A Wound Location: Blister N/A N/A Wounding Event: Diabetic Wound/Ulcer of the Lower N/A N/A Primary Etiology: Extremity Dehisced Wound N/A N/A Secondary Etiology: Hypertension, Type II Diabetes, N/A N/A Comorbid History: Neuropathy 10/29/2021 N/A N/A Date Acquired: 16 N/A N/A Weeks of Treatment: Open N/A N/A Wound Status: No N/A N/A Wound Recurrence: Yes N/A N/A Pending A mputation on Presentation: 3.9x1.8x0.3 N/A N/A Measurements L x W x D (cm) 5.513 N/A N/A A (cm) : rea 1.654 N/A N/A Volume (cm) : 48.90% N/A N/A % Reduction in A rea: 93.90% N/A N/A % Reduction in Volume: Grade 3 N/A N/A Classification: Medium N/A N/A Exudate A mount: Serosanguineous N/A N/A Exudate Type: red, brown N/A N/A Exudate Color: Epibole N/A N/A Wound Margin: Large (67-100%) N/A  N/A Granulation A mount: Red N/A N/A Granulation Quality: Small (1-33%) N/A N/A Necrotic A mount: Fat Layer (Subcutaneous Tissue): Yes N/A N/A Exposed Structures: Muscle: Yes Fascia: No Tendon: No Joint: No Bone: No Small (1-33%) N/A N/A Epithelialization: Treatment Notes Electronic Signature(s) Signed: 05/21/2022 5:00:42 PM By: Massie Kluver Entered By: Massie Kluver on 05/21/2022 11:07:33 -------------------------------------------------------------------------------- Multi-Disciplinary Care Plan Details Patient Name: Date of Service: [name] RO LD F. 05/21/2022 10:30 A M Medical Record Number: [id number] Patient Account Number: [id number] Date of Birth/Sex: Treating RN: [birth year] (61 y.o. [name] Primary Care Staphany Ditton: Alene Mires,  Minna Merritts Other Clinician: Massie Kluver Referring Nazier Neyhart: Treating Ciin Brazzel/Extender: [name] in Treatment: [address], [place] F ([id number]) [id number] Page 5 of 9 Active Inactive HBO Nursing Diagnoses: Anxiety related to feelings of confinement associated with the hyperbaric oxygen chamber Anxiety related to knowledge deficit of hyperbaric oxygen therapy and treatment procedures Discomfort related to temperature and humidity changes inside hyperbaric chamber Potential for barotraumas to ears, sinuses, teeth, and lungs or cerebral gas embolism related to changes in atmospheric pressure inside hyperbaric oxygen chamber Potential for oxygen toxicity seizures related to delivery of 100% oxygen at an increased atmospheric pressure Potential for pulmonary oxygen toxicity related to delivery of 100% oxygen at an increased atmospheric pressure Goals: Barotrauma will be prevented during HBO2 Date Initiated: 02/11/2022 T arget Resolution Date: 02/11/2022 Goal Status: Active Patient and/or family will be able to state/discuss factors appropriate to the management of their disease process during  treatment Date Initiated: 02/11/2022 T arget Resolution Date: 02/11/2022 Goal Status: Active Patient will tolerate the hyperbaric oxygen therapy treatment Date Initiated: 02/11/2022 T arget Resolution Date: 02/11/2022 Goal Status: Active Patient will tolerate the internal climate of the chamber Date Initiated: 02/11/2022 T arget Resolution Date: 02/12/2022 Goal Status: Active Patient/caregiver will verbalize understanding of HBO goals, rationale, procedures and potential hazards Date Initiated: 02/11/2022 T arget Resolution Date: 02/11/2022 Goal Status: Active Signs and symptoms of pulmonary oxygen toxicity will be recognized and promptly addressed Date Initiated: 02/11/2022 T arget Resolution Date: 02/11/2022 Goal Status: Active Signs and symptoms of seizure will be recognized and promptly addressed ; seizing patients will suffer no harm Date Initiated: 02/11/2022 T arget Resolution Date: 02/11/2022 Goal Status: Active Interventions: Administer a five (5) minute air break for patient if signs and symptoms of seizure appear and notify the hyperbaric physician Administer decongestants, per physician orders, prior to HBO2 Administer the correct therapeutic gas delivery based on the patients needs and limitations, per physician order Assess and provide for patients comfort related to the hyperbaric environment and equalization of middle ear Assess for signs and symptoms related to adverse events, including but not limited to confinement anxiety, pneumothorax, oxygen toxicity and baurotrauma Assess patient for any history of confinement anxiety Assess patient's knowledge and expectations regarding hyperbaric medicine and provide education related to the hyperbaric environment, goals of treatment and prevention of adverse events Implement protocols to decrease risk of pneumothorax in high risk patients Notes: Wound/Skin Impairment Nursing Diagnoses: Knowledge deficit related to  ulceration/compromised skin integrity Goals: Patient/caregiver will verbalize understanding of skin care regimen Date Initiated: 01/29/2022 Target Resolution Date: 03/01/2022 Goal Status: Active Ulcer/skin breakdown will have a volume reduction of 30% by week 4 Date Initiated: 01/29/2022 Date Inactivated: 04/16/2022 Target Resolution Date: 03/01/2022 Goal Status: Unmet Unmet Reason: comorbidties Ulcer/skin breakdown will have a volume reduction of 50% by week 8 Date Initiated: 01/29/2022 Date Inactivated: 04/16/2022 Target Resolution Date: 03/31/2022 Goal Status: Unmet Unmet Reason: comorbidties Ulcer/skin breakdown will have a volume reduction of 80% by week 12 Date Initiated: 01/29/2022 Target Resolution Date: 05/01/2022 Goal Status: Active Ulcer/skin breakdown will heal within 14 weeks Date Initiated: 01/29/2022 Target Resolution Date: 05/01/2022 Goal Status: Active Interventions: Assess patient/caregiver ability to obtain necessary supplies JOSEAN, LYCAN ([id number]) [phone].pdf Page 6 of 9 Assess patient/caregiver ability to perform ulcer/skin care regimen upon admission and as needed Assess ulceration(s) every visit Notes: Electronic Signature(s) Signed: 05/21/2022 5:00:42 PM By: Massie Kluver Signed: 05/22/2022 5:38:08 PM By: Gretta Cool, BSN, RN, CWS, Kim RN, BSN Entered By: Massie Kluver on 05/21/2022 11:07:08 --------------------------------------------------------------------------------  Pain Assessment Details Patient Name: Date of Service: Arie Sabina LD F. 05/21/2022 10:30 A M Medical Record Number: [id number] Patient Account Number: [id number] Date of Birth/Sex: Treating RN: August 11, 1960 (61 y.o. [name] Primary Care Rickesha Veracruz: [name] Other Clinician: Massie Kluver Referring Jonuel Butterfield: Treating Ameia Morency/Extender: [name] in Treatment: 16 Active Problems Location of Pain Severity and Description of  Pain Patient Has Paino No Site Locations Pain Management and Medication Current Pain Management: Electronic Signature(s) Signed: 05/21/2022 5:00:42 PM By: Massie Kluver Signed: 05/22/2022 5:38:08 PM By: Gretta Cool, BSN, RN, CWS, Kim RN, BSN Entered By: Massie Kluver on 05/21/2022 10:56:27 [name] ([id number]) [id number] Page 7 of 9 -------------------------------------------------------------------------------- Patient/Caregiver Education Details Patient Name: Date of Service: [name] RO LD F. 11/21/2023andnbsp10:30 A M Medical Record Number: [id number] Patient Account Number: [id number] Date of Birth/Gender: Treating RN: 05-24-1961 (61 y.o. [name] Primary Care Physician: [name] Other Clinician: Massie Kluver Referring Physician: Treating Physician/Extender: [name] in Treatment: 16 Education Assessment Education Provided To: Patient Education Topics Provided Wound/Skin Impairment: Handouts: Other: continue wound care as directed Methods: Explain/Verbal Responses: State content correctly Electronic Signature(s) Signed: 05/21/2022 5:00:42 PM By: Massie Kluver Entered By: Massie Kluver on 05/21/2022 12:07:02 -------------------------------------------------------------------------------- Wound Assessment Details Patient Name: Date of Service: [name] RO LD F. 05/21/2022 10:30 A M Medical Record Number: [id number] Patient Account Number: [id number] Date of Birth/Sex: Treating RN: [birth year] (61 y.o. [name] Primary Care Collins Dimaria: [name] Other Clinician: Massie Kluver Referring Marena Witts: Treating Ericka Marcellus/Extender: [name] in Treatment: 16 Wound Status Wound Number: 14 Primary Etiology: Diabetic Wound/Ulcer of the Lower Extremity Wound Location: Left, Lateral Foot Secondary Etiology: Dehisced Wound Wounding Event: Blister Wound Status: Open Date  Acquired: 10/29/2021 Comorbid History: Hypertension, Type II Diabetes, Neuropathy Weeks Of Treatment: 16 Clustered Wound: No Pending Amputation On Presentation Photos YANKY, [place]ER[place]G ([id number]) [phone].pdf Page 8 of 9 Wound Measurements Length: (cm) 3.9 Width: (cm) 1.8 Depth: (cm) 0.3 Area: (cm) 5.513 Volume: (cm) 1.654 % Reduction in Area: 48.9% % Reduction in Volume: 93.9% Epithelialization: Small (1-33%) Wound Description Classification: Grade 3 Wound Margin: Epibole Exudate Amount: Medium Exudate Type: Serosanguineous Exudate Color: red, brown Foul Odor After Cleansing: No Slough/Fibrino Yes Wound Bed Granulation Amount: Large (67-100%) Exposed Structure Granulation Quality: Red Fascia Exposed: No Necrotic Amount: Small (1-33%) Fat Layer (Subcutaneous Tissue) Exposed: Yes Necrotic Quality: Adherent Slough Tendon Exposed: No Muscle Exposed: Yes Necrosis of Muscle: No Joint Exposed: No Bone Exposed: No Treatment Notes Wound #14 (Foot) Wound Laterality: Left, Lateral Cleanser [place] Ancillary Kit - 15 Day Supply Discharge Instruction: Use supplies as instructed; Kit contains: (15) Saline Bullets; (15) 3x3 Gauze; 15 pr Gloves Wound Cleanser Discharge Instruction: Wash your hands with soap and water. Remove old dressing, discard into plastic bag and place into trash. Cleanse the wound with Wound Cleanser prior to applying a clean dressing using gauze sponges, not tissues or cotton balls. Do not scrub or use excessive force. Pat dry using gauze sponges, not tissue or cotton balls. Peri-Wound Care AandD Ointment Discharge Instruction: Apply AandD Ointment as directed Topical Primary Dressing Hydrofera Blue Ready Transfer Foam, 4x5 (in/in) Discharge Instruction: Apply Hydrofera Blue Ready to wound bed as directed Secondary Dressing ABD Pad 5x9 (in/in) Discharge Instruction: Cover with ABD pad Secured With ACE WRAP - 59M ACE Elastic Bandage  With VELCRO Brand Closure, 4 (in) Medipore T - 59M Medipore H Soft Cloth Surgical T ape ape,  2x2 (in/yd) Kerlix Roll Sterile or Non-Sterile 6-ply 4.5x4 (yd/yd) Discharge Instruction: Apply Kerlix as directed Compression Wrap Compression Stockings Add-Ons MILON, DETHLOFF ([id number]) [id number] Page 9 of 9 Electronic Signature(s) Signed: 05/21/2022 5:00:42 PM By: Massie Kluver Signed: 05/22/2022 5:38:08 PM By: Gretta Cool, BSN, RN, CWS, Kim RN, BSN Entered By: Massie Kluver on 05/21/2022 11:05:38 -------------------------------------------------------------------------------- Vitals Details Patient Name: Date of Service: [name], [name] RO LD F. 05/21/2022 10:30 A M Medical Record Number: [id number] Patient Account Number: [id number] Date of Birth/Sex: Treating RN: 1960/12/27 (61 y.o. [name], [name] Primary Care Medardo Hassing: [name] Other Clinician: Massie Kluver Referring Adreanne Yono: Treating Khaleelah Yowell/Extender: [name] in Treatment: 16 Vital Signs Time Taken: 10:55 Temperature (F): 97.8 Height (in): 70 Pulse (bpm): 72 Weight (lbs): 255 Respiratory Rate (breaths/min): 16 Body Mass Index (BMI): 36.6 Blood Pressure (mmHg): 118/72 Reference Range: 80 - 120 mg / dl Electronic Signature(s) Signed: 05/21/2022 5:00:42 PM By: Massie Kluver Entered By: Massie Kluver on 05/21/2022 10:56:06

## 2022-05-21 NOTE — Progress Notes (Signed)
YOGI, ARTHER ([id number]) [id number] Page 1 of 2 Visit Report for 05/21/2022 Chief Complaint Document Details Patient Name: Date of Service: [name] RO LD F. 05/21/2022 10:30 A M Medical Record Number: [id number] Patient Account Number: [id number] Date of Birth/Sex: Treating RN: Nov 28, 1960 (61 y.o. [name] Primary Care Provider: [name] Other Clinician: Massie Kluver Referring Provider: Treating Provider/Extender: [name] in Treatment: 16 Information Obtained from: Patient Chief Complaint Left lateral foot ulcer Electronic Signature(s) Signed: 05/21/2022 10:09:56 AM By: Worthy Keeler PA-C Entered By: Worthy Keeler on 05/21/2022 10:09:56 -------------------------------------------------------------------------------- Problem List Details Patient Name: Date of Service: [name] RO LD F. 05/21/2022 10:30 A M Medical Record Number: [id number] Patient Account Number: [id number] Date of Birth/Sex: Treating RN: 24-Aug-1960 (61 y.o. 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

## 2022-05-21 NOTE — Progress Notes (Signed)
[name] ([id number]) [id number] Page 1 of 2 Visit Report for 05/21/2022 Arrival Information Details Patient Name: Date of Service: [name] RO LD F. 05/21/2022 8:00 A M Medical Record Number: [id number] Patient Account Number: [id number] Date of Birth/Sex: Treating RN: Sep 21, 1960 (61 y.o. [name], [name] Primary Care [name]: [name] Other Clinician: Jacqulyn [name] Referring [name]: Treating [name]/Extender: [name] in Treatment: 48 Visit Information History Since Last Visit Added or deleted any medications: No Patient Arrived: Knee Scooter Any new allergies or adverse reactions: No Arrival Time: 08:10 Had a fall or experienced change in No Accompanied By: self activities of daily living that may affect Transfer Assistance: None risk of falls: Patient Identification Verified: Yes Signs or symptoms of abuse/neglect since No Secondary Verification Process Completed: Yes last visito Patient Requires Transmission-Based Precautions: No Hospitalized since last visit: No Patient Has Alerts: No Implantable device outside of the clinic No excluding cellular tissue based products placed in the center since last visit: Has Dressing in Place as Prescribed: Yes Has Footwear/Offloading in Place as Yes Prescribed: Left: Surgical Shoe with Pressure Relief Insole Pain Present Now: No Electronic Signature(s) Signed: 05/21/2022 2:11:08 PM By: [name] RCP, RRT CHT , , Entered By: [name] on 05/21/2022 08:45:35 , -------------------------------------------------------------------------------- Encounter Discharge Information Details Patient Name: Date of Service: [name] RO LD F. 05/21/2022 8:00 A M Medical Record Number: [id number] Patient Account Number: [id number] Date of Birth/Sex: Treating RN: 01-21-1961 (61 y.o. [name], [name] Primary Care [name]: [name] Other  Clinician: Jacqulyn [name] Referring [name]: Treating [name]/Extender: [name] in Treatment: [phone] Encounter Discharge Information Items Discharge Condition: Stable Ambulatory Status: Knee Scooter Discharge Destination: Home [name], [name] ([id number]) [id number] Page 2 of 2 Transportation: Private Auto Accompanied By: self Schedule Follow-up Appointment: Yes Clinical Summary of Care: Notes Patient completed his course of treatments per [name], [name]. Electronic Signature(s) Signed: 05/21/2022 2:11:08 PM By: [name] RCP, RRT CHT , , Entered By: [name] on 05/21/2022 14:08:31 , -------------------------------------------------------------------------------- Vitals Details Patient Name: Date of Service: [name], [name] RO LD F. 05/21/2022 8:00 A M Medical Record Number: [id number] Patient Account Number: [id number] Date of Birth/Sex: Treating RN: 05/19/1961 (61 y.o. [name], [name] Primary Care Cyenna Rebello: [name] Other Clinician: Jacqulyn [name] Referring Kye Hedden: Treating Chales Pelissier/Extender: [name] in Treatment: 16 Vital Signs Time Taken: 08:10 Temperature (F): 97.8 Height (in): 70 Pulse (bpm): 78 Weight (lbs): 255 Respiratory Rate (breaths/min): 16 Body Mass Index (BMI): 36.6 Blood Pressure (mmHg): 108/64 Capillary Blood Glucose (mg/dl): 290 Reference Range: 80 - 120 mg / dl Electronic Signature(s) Signed: 05/21/2022 2:11:08 PM By: [name] RCP, RRT CHT , , Entered By: [name] on 05/21/2022 08:47:14 ,

## 2022-05-22 ENCOUNTER — Encounter: Payer: 59 | Admitting: Internal Medicine

## 2022-05-28 ENCOUNTER — Encounter: Payer: 59 | Admitting: Physician Assistant

## 2022-05-28 DIAGNOSIS — M86372 Chronic multifocal osteomyelitis, left ankle and foot: Secondary | ICD-10-CM | POA: Diagnosis not present

## 2022-05-28 NOTE — Progress Notes (Addendum)
HUDSEN, FEI ([id number]) [id number] Page 1 of 9 Visit Report for 05/28/2022 Arrival Information Details Patient Name: Date of Service: [name] RO LD F. 05/28/2022 10:30 A M Medical Record Number: [id number] Patient Account Number: [id number] Date of Birth/Sex: Treating RN: [birth year] (61 y.o. [name]) Carlene Coria Primary Care Stanislaus Kaltenbach: [name] Other Clinician: Massie Kluver Referring Pauleen Goleman: Treating Annora Guderian/Extender: [name] in Treatment: 4 Visit Information History Since Last Visit All ordered tests and consults were completed: No Patient Arrived: Knee Scooter Added or deleted any medications: No Arrival Time: 10:46 Any new allergies or adverse reactions: No Transfer Assistance: None Had a fall or experienced change in No Patient Identification Verified: Yes activities of daily living that may affect Secondary Verification Process Completed: Yes risk of falls: Patient Requires Transmission-Based Precautions: No Signs or symptoms of abuse/neglect since last visito No Patient Has Alerts: No Hospitalized since last visit: No Implantable device outside of the clinic excluding No cellular tissue based products placed in the center since last visit: Has Dressing in Place as Prescribed: Yes Pain Present Now: No Electronic Signature(s) Signed: 05/28/2022 5:05:44 PM By: Massie Kluver Entered By: Massie Kluver on 05/28/2022 10:47:05 -------------------------------------------------------------------------------- Clinic Level of Care Assessment Details Patient Name: Date of Service: [name] RO LD F. 05/28/2022 10:30 A M Medical Record Number: [id number] Patient Account Number: [id number] Date of Birth/Sex: Treating RN: Sep 07, 1960 (61 y.o. [name]) Carlene Coria Primary Care Zyeir Dymek: [name] Other Clinician: Massie Kluver Referring Illona Bulman: Treating Molleigh Huot/Extender: [name] in  Treatment: 17 Clinic Level of Care Assessment Items TOOL 1 Quantity Score _0  - 0 Use when EandM and Procedure is performed on INITIAL visit ASSESSMENTS - Nursing Assessment / Reassessment _1  - 0 General Physical Exam (combine w/ comprehensive assessment (listed just below) when performed on new pt. evals) _2  - 0 Comprehensive Assessment (HX, ROS, Risk Assessments, Wounds Hx, etc.) SLAYTON, LUBITZ ([id number]) [id number] Page 2 of 9 ASSESSMENTS - Wound and Skin Assessment / Reassessment _3  - 0 Dermatologic / Skin Assessment (not related to wound area) ASSESSMENTS - Ostomy and/or Continence Assessment and Care _4  - 0 Incontinence Assessment and Management _5  - 0 Ostomy Care Assessment and Management (repouching, etc.) PROCESS - Coordination of Care _6  - 0 Simple Patient / Family Education for ongoing care _7  - 0 Complex (extensive) Patient / Family Education for ongoing care _8  - 0 Staff obtains Programmer, systems, Records, T Results / Process Orders est _9  - 0 Staff telephones HHA, Nursing Homes / Clarify orders / etc _10  - 0 Routine Transfer to another Facility (non-emergent condition) _11  - 0 Routine Hospital Admission (non-emergent condition) _12  - 0 New Admissions / Biomedical engineer / Ordering NPWT Apligraf, etc. , _13  - 0 Emergency Hospital Admission (emergent condition) PROCESS - Special Needs _14  - 0 Pediatric / Minor Patient Management _15  - 0 Isolation Patient Management _16  - 0 Hearing / Language / Visual special needs _17  - 0 Assessment of Community assistance (transportation, D/C planning, etc.) _18  - 0 Additional assistance / Altered mentation _19  - 0 Support Surface(s) Assessment (bed, cushion, seat, etc.) INTERVENTIONS - Miscellaneous _20  - 0 External ear exam _21  - 0 Patient Transfer (multiple staff / Civil Service fast streamer / Similar devices) _22  - 0 Simple Staple / Suture removal (25 or less) _23  - 0 Complex Staple / Suture removal (26 or  more) _24  - 0 Hypo/Hyperglycemic Management (do not check if billed separately) _25  - 0 Ankle / Brachial Index (ABI) - do not check  if billed separately Has the patient been seen at the hospital within the last three years: Yes Total Score: 0 Level Of Care: ____ Electronic Signature(s) Signed: 05/28/2022 5:05:44 PM By: Massie Kluver Entered By: Massie Kluver on 05/28/2022 11:27:19 -------------------------------------------------------------------------------- Encounter Discharge Information Details Patient Name: Date of Service: [name] RO LD F. 05/28/2022 10:30 A M Medical Record Number: [id number] Patient Account Number: [id number] Date of Birth/Sex: Treating RN: [birth year] (61 y.o. [name] Primary Care Promise Bushong: [name] Other Clinician: Massie Kluver Referring Zorah Backes: Treating Happy Ky/Extender: Daymien, Goth ([id number]) [id number] Page 3 of 9 Weeks in Treatment: 17 Encounter Discharge Information Items Post Procedure Vitals Discharge Condition: Stable Temperature (F): 97.8 Ambulatory Status: Knee Scooter Pulse (bpm): 106 Discharge Destination: Home Respiratory Rate (breaths/min): 18 Transportation: Private Auto Blood Pressure (mmHg): 178/88 Accompanied By: self Schedule Follow-up Appointment: Yes Clinical Summary of Care: Electronic Signature(s) Signed: 05/28/2022 5:05:44 PM By: Massie Kluver Entered By: Massie Kluver on 05/28/2022 14:58:26 -------------------------------------------------------------------------------- Lower Extremity Assessment Details Patient Name: Date of Service: [name] RO LD F. 05/28/2022 10:30 A M Medical Record Number: [id number] Patient Account Number: [id number] Date of Birth/Sex: Treating RN: Dec 05, 1960 (61 y.o. [name]) Carlene Coria Primary Care Judithe Keetch: [name] Other Clinician: Massie Kluver Referring Deeana Atwater: Treating Audrena Talaga/Extender: [name] in Treatment: 17 Electronic Signature(s) Signed: 05/28/2022 4:00:23 PM By: Carlene Coria RN Signed: 05/28/2022 5:05:44 PM By: Massie Kluver Entered By: Massie Kluver on 05/28/2022 11:06:03 -------------------------------------------------------------------------------- Multi Wound Chart Details Patient Name: Date of Service: [name] RO LD F. 05/28/2022 10:30 A M Medical Record Number: [id number] Patient Account Number: [id number] Date of Birth/Sex: Treating RN: 25-Jul-1960 (61 y.o. [name] Primary Care Scherry Laverne: [name] Other Clinician: Massie Kluver Referring Josette Shimabukuro: Treating Harshan Kearley/Extender: [name] in Treatment: 17 Vital Signs Height(in): 70 Pulse(bpm): 106 Weight(lbs): 255 Blood Pressure(mmHg): 178/88 Body Mass Index(BMI): 36.6 Temperature(F): 97.8 Respiratory Rate(breaths/min): 18 [Goris, Darey F ([id number]):Photos:] [id number].pdf Page 4 of 9:14 N/A N/A N/A N/A] Left, Lateral Foot N/A N/A Wound Location: Blister N/A N/A Wounding Event: Diabetic Wound/Ulcer of the Lower N/A N/A Primary Etiology: Extremity Dehisced Wound N/A N/A Secondary Etiology: Hypertension, Type II Diabetes, N/A N/A Comorbid History: Neuropathy 10/29/2021 N/A N/A Date Acquired: 49 N/A N/A Weeks of Treatment: Open N/A N/A Wound Status: No N/A N/A Wound Recurrence: Yes N/A N/A Pending A mputation on Presentation: 4.1x2x0.3 N/A N/A Measurements L x W x D (cm) 6.44 N/A N/A A (cm) : rea 1.932 N/A N/A Volume (cm) : 40.40% N/A N/A % Reduction in A rea: 92.80% N/A N/A % Reduction in Volume: 2 Starting Position 1 (o'clock): 5 Ending Position 1 (o'clock): 1.8 Maximum Distance 1 (cm): Yes N/A N/A Undermining: Grade 3 N/A N/A Classification: Medium N/A N/A Exudate A mount: Serosanguineous N/A N/A Exudate Type: red, brown N/A N/A Exudate Color: Epibole N/A N/A Wound  Margin: Large (67-100%) N/A N/A Granulation A mount: Red N/A N/A Granulation Quality: Small (1-33%) N/A N/A Necrotic A mount: Fat Layer (Subcutaneous Tissue): Yes N/A N/A Exposed Structures: Muscle: Yes Fascia: No Tendon: No Joint: No Bone: No Small (1-33%) N/A N/A Epithelialization: Treatment Notes Electronic Signature(s) Signed: 05/28/2022 5:05:44 PM By: Massie Kluver Entered By: Massie Kluver on 05/28/2022 11:06:20 -------------------------------------------------------------------------------- Multi-Disciplinary Care Plan Details Patient Name: Date of Service: [name] RO LD F. 05/28/2022 10:30 A M Medical Record Number: [id number] Patient Account Number: [id number] Date of Birth/Sex: Treating RN: 05-10-1961 (61 y.o. [name]) Carlene Coria Primary  Care Emmaly Leech: [name] Other Clinician: Massie Kluver Referring Kyl Givler: Treating Braydee Shimkus/Extender: [name] in Treatment: [address] [place] F ([id number]) [id number] Page 5 of 9 Active Inactive HBO Nursing Diagnoses: Anxiety related to feelings of confinement associated with the hyperbaric oxygen chamber Anxiety related to knowledge deficit of hyperbaric oxygen therapy and treatment procedures Discomfort related to temperature and humidity changes inside hyperbaric chamber Potential for barotraumas to ears, sinuses, teeth, and lungs or cerebral gas embolism related to changes in atmospheric pressure inside hyperbaric oxygen chamber Potential for oxygen toxicity seizures related to delivery of 100% oxygen at an increased atmospheric pressure Potential for pulmonary oxygen toxicity related to delivery of 100% oxygen at an increased atmospheric pressure Goals: Barotrauma will be prevented during HBO2 Date Initiated: 02/11/2022 T arget Resolution Date: 02/11/2022 Goal Status: Active Patient and/or family will be able to state/discuss factors appropriate to the management  of their disease process during treatment Date Initiated: 02/11/2022 T arget Resolution Date: 02/11/2022 Goal Status: Active Patient will tolerate the hyperbaric oxygen therapy treatment Date Initiated: 02/11/2022 T arget Resolution Date: 02/11/2022 Goal Status: Active Patient will tolerate the internal climate of the chamber Date Initiated: 02/11/2022 T arget Resolution Date: 02/12/2022 Goal Status: Active Patient/caregiver will verbalize understanding of HBO goals, rationale, procedures and potential hazards Date Initiated: 02/11/2022 T arget Resolution Date: 02/11/2022 Goal Status: Active Signs and symptoms of pulmonary oxygen toxicity will be recognized and promptly addressed Date Initiated: 02/11/2022 T arget Resolution Date: 02/11/2022 Goal Status: Active Signs and symptoms of seizure will be recognized and promptly addressed ; seizing patients will suffer no harm Date Initiated: 02/11/2022 T arget Resolution Date: 02/11/2022 Goal Status: Active Interventions: Administer a five (5) minute air break for patient if signs and symptoms of seizure appear and notify the hyperbaric physician Administer decongestants, per physician orders, prior to HBO2 Administer the correct therapeutic gas delivery based on the patients needs and limitations, per physician order Assess and provide for patients comfort related to the hyperbaric environment and equalization of middle ear Assess for signs and symptoms related to adverse events, including but not limited to confinement anxiety, pneumothorax, oxygen toxicity and baurotrauma Assess patient for any history of confinement anxiety Assess patient's knowledge and expectations regarding hyperbaric medicine and provide education related to the hyperbaric environment, goals of treatment and prevention of adverse events Implement protocols to decrease risk of pneumothorax in high risk patients Notes: Wound/Skin Impairment Nursing Diagnoses: Knowledge  deficit related to ulceration/compromised skin integrity Goals: Patient/caregiver will verbalize understanding of skin care regimen Date Initiated: 01/29/2022 Target Resolution Date: 03/01/2022 Goal Status: Active Ulcer/skin breakdown will have a volume reduction of 30% by week 4 Date Initiated: 01/29/2022 Date Inactivated: 04/16/2022 Target Resolution Date: 03/01/2022 Goal Status: Unmet Unmet Reason: comorbidties Ulcer/skin breakdown will have a volume reduction of 50% by week 8 Date Initiated: 01/29/2022 Date Inactivated: 04/16/2022 Target Resolution Date: 03/31/2022 Goal Status: Unmet Unmet Reason: comorbidties Ulcer/skin breakdown will have a volume reduction of 80% by week 12 Date Initiated: 01/29/2022 Target Resolution Date: 05/01/2022 Goal Status: Active Ulcer/skin breakdown will heal within 14 weeks Date Initiated: 01/29/2022 Target Resolution Date: 05/01/2022 Goal Status: Active Interventions: Assess patient/caregiver ability to obtain necessary supplies Assess patient/caregiver ability to perform ulcer/skin care regimen upon admission and as needed Assess ulceration(s) every visit GAINES, CARTMELL ([id number]) [id number] Page 6 of 9 Notes: Electronic Signature(s) Signed: 05/28/2022 4:00:23 PM By: Carlene Coria RN Signed: 05/28/2022 5:05:44 PM By: Massie Kluver Entered By: Massie Kluver on 05/28/2022 11:06:09 --------------------------------------------------------------------------------  Pain Assessment Details Patient Name: Date of Service: [name] RO LD F. 05/28/2022 10:30 A M Medical Record Number: [id number] Patient Account Number: [id number] Date of Birth/Sex: Treating RN: 05/02/1961 (61 y.o. [name]) Carlene Coria Primary Care Gayla Benn: [name] Other Clinician: Massie Kluver Referring Fern Canova: Treating Anvita Hirata/Extender: [name] in Treatment: 17 Active Problems Location of Pain Severity and Description of  Pain Patient Has Paino No Site Locations Pain Management and Medication Current Pain Management: Electronic Signature(s) Signed: 05/28/2022 4:00:23 PM By: Carlene Coria RN Signed: 05/28/2022 5:05:44 PM By: Massie Kluver Entered By: Massie Kluver on 05/28/2022 10:51:51 [name] ([id number]) [id number] Page 7 of 9 -------------------------------------------------------------------------------- Patient/Caregiver Education Details Patient Name: Date of Service: [name] RO LD F. 11/28/2023andnbsp10:30 A M Medical Record Number: [id number] Patient Account Number: [id number] Date of Birth/Gender: Treating RN: 1960/08/18 (61 y.o. [name]) Carlene Coria Primary Care Physician: [name] Other Clinician: Massie Kluver Referring Physician: Treating Physician/Extender: [name] in Treatment: 17 Education Assessment Education Provided To: Patient Education Topics Provided Wound/Skin Impairment: Handouts: Other: continue wound care as directed Methods: Explain/Verbal Responses: State content correctly Electronic Signature(s) Signed: 05/28/2022 5:05:44 PM By: Massie Kluver Entered By: Massie Kluver on 05/28/2022 11:27:46 -------------------------------------------------------------------------------- Wound Assessment Details Patient Name: Date of Service: [name] RO LD F. 05/28/2022 10:30 A M Medical Record Number: [id number] Patient Account Number: [id number] Date of Birth/Sex: Treating RN: December 16, 1960 (61 y.o. [name] Primary Care Cheyanne Lamison: [name] Other Clinician: Massie Kluver Referring Penny Frisbie: Treating Haleema Vanderheyden/Extender: [name] in Treatment: 17 Wound Status Wound Number: 14 Primary Etiology: Diabetic Wound/Ulcer of the Lower Extremity Wound Location: Left, Lateral Foot Secondary Etiology: Dehisced Wound Wounding Event: Blister Wound Status: Open Date Acquired:  10/29/2021 Comorbid History: Hypertension, Type II Diabetes, Neuropathy Weeks Of Treatment: 17 Clustered Wound: No Pending Amputation On Presentation Photos DESTON, BILYEU ([id number]) [id number] Page 8 of 9 Wound Measurements Length: (cm) 4.1 Width: (cm) 2 Depth: (cm) 0.3 Area: (cm) 6.44 Volume: (cm) 1.932 % Reduction in Area: 40.4% % Reduction in Volume: 92.8% Epithelialization: Small (1-33%) Tunneling: No Undermining: Yes Starting Position (o'clock): 2 Ending Position (o'clock): 5 Maximum Distance: (cm) 1.8 Wound Description Classification: Grade 3 Wound Margin: Epibole Exudate Amount: Medium Exudate Type: Serosanguineous Exudate Color: red, brown Foul Odor After Cleansing: No Slough/Fibrino Yes Wound Bed Granulation Amount: Large (67-100%) Exposed Structure Granulation Quality: Red Fascia Exposed: No Necrotic Amount: Small (1-33%) Fat Layer (Subcutaneous Tissue) Exposed: Yes Necrotic Quality: Adherent Slough Tendon Exposed: No Muscle Exposed: Yes Necrosis of Muscle: No Joint Exposed: No Bone Exposed: No Treatment Notes Wound #14 (Foot) Wound Laterality: Left, Lateral Cleanser [place] Ancillary Kit - 15 Day Supply Discharge Instruction: Use supplies as instructed; Kit contains: (15) Saline Bullets; (15) 3x3 Gauze; 15 pr Gloves Wound Cleanser Discharge Instruction: Wash your hands with soap and water. Remove old dressing, discard into plastic bag and place into trash. Cleanse the wound with Wound Cleanser prior to applying a clean dressing using gauze sponges, not tissues or cotton balls. Do not scrub or use excessive force. Pat dry using gauze sponges, not tissue or cotton balls. Peri-Wound Care AandD Ointment Discharge Instruction: Apply AandD Ointment as directed Topical Primary Dressing Hydrofera Blue Ready Transfer Foam, 4x5 (in/in) Discharge Instruction: Apply Hydrofera Blue Ready to wound bed as directed Secondary  Dressing ABD Pad 5x9 (in/in) Discharge Instruction: Cover with ABD pad Secured With ACE WRAP - 68M ACE Elastic Bandage With VELCRO Brand Closure, 4 (in)  Medipore T - 61M Medipore H Soft Cloth Surgical T ape ape, 2x2 (in/yd) Kerlix Roll Sterile or Non-Sterile 6-ply 4.5x4 (yd/yd) Discharge Instruction: Apply Kerlix as directed Compression DEEJAY, KOPPELMAN ([id number]) [id number] Page 9 of 9 Compression Stockings Add-Ons Electronic Signature(s) Signed: 05/28/2022 4:00:23 PM By: Carlene Coria RN Signed: 05/28/2022 5:05:44 PM By: Massie Kluver Entered By: Massie Kluver on 05/28/2022 11:05:42 -------------------------------------------------------------------------------- Vitals Details Patient Name: Date of Service: Maryfrances Bunnell, Lamonte Sakai RO LD F. 05/28/2022 10:30 A M Medical Record Number: [id number] Patient Account Number: [id number] Date of Birth/Sex: Treating RN: 06-13-1961 (61 y.o. [name]) Carlene Coria Primary Care Dupree Givler: [name] Other Clinician: Massie Kluver Referring Wrangler Penning: Treating Leily Capek/Extender: [name] in Treatment: 17 Vital Signs Time Taken: 10:48 Temperature (F): 97.8 Height (in): 70 Pulse (bpm): 106 Weight (lbs): 255 Respiratory Rate (breaths/min): 18 Body Mass Index (BMI): 36.6 Blood Pressure (mmHg): 178/88 Reference Range: 80 - 120 mg / dl Electronic Signature(s) Signed: 05/28/2022 5:05:44 PM By: Massie Kluver Entered By: Massie Kluver on 05/28/2022 10:51:45

## 2022-05-28 NOTE — Progress Notes (Addendum)
RAYLIN, WINER ([id number]) [id number] Page 1 of 14 Visit Report for 05/28/2022 Chief Complaint Document Details Patient Name: Date of Service: [name] Medical Record Number: [id number] Patient Account Number: [id number] Date of Birth/Sex: Treating RN: 1960/11/29 (61 y.o. [name]) Carlene Coria Primary Care Provider: [name] Other Clinician: Massie Kluver Referring Provider: Treating Provider/Extender: [name] in Treatment: 27 Information Obtained from: Patient Chief Complaint Left lateral foot ulcer Electronic Signature(s) Signed: 05/28/2022 10:39:03 AM By: Worthy Keeler PA-C Entered By: Worthy Keeler on 05/28/2022 10:39:02 -------------------------------------------------------------------------------- Debridement Details Patient Name: Date of Service: [name] Medical Record Number: [id number] Patient Account Number: [id number] Date of Birth/Sex: Treating RN: 01-Jul-1961 (61 y.o. [name]) Carlene Coria Primary Care Provider: [name] Other Clinician: Massie Kluver Referring Provider: Treating Provider/Extender: [name] in Treatment: 17 Debridement Performed for Assessment: Wound #14 Left,Lateral Foot Performed By: Physician Tommie Sams., PA-C Debridement Type: Debridement Severity of Tissue Pre Debridement: Fat layer exposed Level of Consciousness (Pre-procedure): Awake and Alert Pre-procedure Verification/Time Out Yes - 11:15 Taken: Start Time: 11:15 T Area Debrided (L x W): otal 6 (cm) x 4 (cm) = 24 (cm) Tissue and other material debrided: Viable, Non-Viable, Callus, Slough, Subcutaneous, Slough Level: Skin/Subcutaneous Tissue Debridement Description: Excisional Instrument: Curette Bleeding: Minimum Hemostasis Achieved: Pressure Response to Treatment: Procedure was tolerated well Level of Consciousness (Post- Awake  and Alert procedure): [name] ([id number]) [id number] Page 2 of 14 Post Debridement Measurements of Total Wound Length: (cm) 4.5 Width: (cm) 2.3 Depth: (cm) 0.3 Volume: (cm) 2.439 Character of Wound/Ulcer Post Debridement: Stable Severity of Tissue Post Debridement: Fat layer exposed Post Procedure Diagnosis Same as Pre-procedure Electronic Signature(s) Signed: 05/28/2022 4:00:23 PM By: Carlene Coria RN Signed: 05/28/2022 4:53:03 PM By: Worthy Keeler PA-C Signed: 05/28/2022 5:05:44 PM By: Massie Kluver Entered By: Massie Kluver on 05/28/2022 11:26:54 -------------------------------------------------------------------------------- HPI Details Patient Name: Date of Service: [name] Medical Record Number: [id number] Patient Account Number: [id number] Date of Birth/Sex: Treating RN: 1961-04-17 (61 y.o. [name] Primary Care Provider: [name] Other Clinician: Massie Kluver Referring Provider: Treating Provider/Extender: [name] in Treatment: 37 History of Present Illness HPI Description: Pleasant 61 year old with history of diabetes (Hgb A1c 10.8 in 2014) and peripheral neuropathy. No PVD. L ABI 1.1. Status post right great toe partial amputation years ago. He was at work and on 10/22/2014, was injured by a cart, and suffered an ulceration to his left anterior calf. He says that it subsequently became infected, and he was treated with a course of antibiotics. He was found on initial exam to have an ulceration on the dorsum of his left third toe. He was unaware of this and attributes it to pressure from his steel toed boots. More recently he injured his right anterior calf on a cart. Ambulating normally per his baseline. He has been undergoing regular debridements, applying mupirocin cream, and an Ace wrap for edema control. He returns to clinic for follow-up and is without  complaints. No pain. No fever or chills. No drainage. 10/25/15; this is a 61 year old man who has type II diabetes with diabetic polyneuropathy. He tells me that he fractured his left fifth metatarsal in June 2016 when he presented with swelling. He does not recall a specific injury. His hemoglobin A1c was apparently too high at the time for  consideration of surgery and he was put in some form of offloading. Ultimately he went to surgery in December with an allograft from his calcaneus to this site, plate and screws. He had an x-ray of the foot in March that showed concerns about nonunion. He tells me that in March he had to move and basically moved himself. He was on his foot a lot and then noticed some drainage from an open area. He has been following with his orthopedic surgeon Dr. Doran Durand. He has been applying a felt donut, dry dressing and using his heel healing sandal. 11/01/15; this is a patient I saw last week for the first time. He had a small open wound on the plantar aspect of his left foot at roughly the level of the base of his fifth metatarsal. He had a considerable degree of thickened skin around this wound on the plantar aspect which I thought was from chronic pressure on this area. He tells [place] that he had drainage over the course of the week. No systemic symptoms. 11/08/15; culture last week grew Citrobacter korseri. This should've been sensitive to the Augmentin I gave him. He has seen Dr. Doran Durand who did his initial surgery and according the patient the plan is to give this another month and then the hardware might need to come out of this. This seems like a reasonable plan. I will adjust his antibiotics to ciprofloxacin which probably should continue for at least another 2 weeks. I gave him 10 days worth today 11/22/15 the patient has completed antibiotics. He has an appointment with Dr. Doran Durand this Friday. There is improved dimensions around the wound on the left fifth metatarsal  base 11/29/15; the patient has completed antibiotics last week. Apparently his appointment with Dr. Doran Durand it is not until this Friday. Dimensions are roughly the same. 12/06/15; saw Dr. Doran Durand. No x-ray told the end of the month, next appointment June 30. We have been using Aquacel Ag 12/13/15: No major change this week. Using Aquacel AG 621/17; arrived this week with maceration around the wound. There was quite a bit of undermining which required surgical debridement. I changed him to [facility] last week, by the patient's admission he was up on this more this week 12/27/15; macerated tissue around the wound is removed with a scalpel and pickups. There is no undermining. Nonviable subcutaneous tissue and skin taken from the superior circumference of the wound is slough from the surface. READMISSION 03/06/16 since I last saw this patient at the end of June, he went for surgery on 01/11/16 by Dr. Doran Durand of orthopedics. He had a left foot irrigation and debridement, removal of hardware and placement of wound VAC. He is also been followed by Dr. Megan Salon of infectious disease and completed a six-week course of IV Rocephin for group B strep and Enterobacter in the bone at the time of surgery. Apparently at the time of surgery the bone looked healthy so I don't think any bone was actually removed. He has been using silver alginate based dressings on the same wound area at the base of the left fifth metatarsal KENYATTE, CHATMON ([id number]) [id number] Page 3 of 14 on the left. I note that he is also had arterial studies on 01/08/16, these showed a left ABI of 1.2 to and a right ABI of 1.3. Waveforms were listed as biphasic. He was not felt to have any specific arterial issues. 03/13/16; no real change in the condition of the wound at the left lateral foot at  roughly the base of his fifth metatarsal. Use silver alginate last week. 03/18/16 arrives today with no open area. Being  suspicious of the overlying callus I. Some of this back although I see nothing but covering tissue here/epithelium. There is no surrounding tenderness READMISSION 04/16/16 this is a patient I discharged about a month ago. Initially a surgical wound on the lateral aspect of his left foot which subsequently became infected. The story he is giving today that he went back into his own modified shoe started to notice pain 2 weeks ago he was seen in Dr. Nona Dell office by a physician assistant last Wednesday and according the patient was told that everything looked fine however this is clearly now broken open and he has an open wound in the same spot that we have been dealing with repetitively. Situation is complicated by the fact that he is running short of money on long-term disability. He has not taken his insulin and at least 2 weeks was previously on NovoLog short acting insulin on a sliding scale and TRESIBA 35 units at bedtime. He is no longer able to afford any of his medication he was in the x-ray on 10/15. A plain x-ray showed healed fracture of the left fifth metatarsal bone status post removal of the associated plate and screw fixation hardware. There was no acute appearing osseous abnormality. His blood work showed a white count of 9.2 with an essentially normal differential comprehensive metabolic panel was normal. Previous CT scan of the foot on 01/08/16 showed no osteomyelitis previous vascular workup showed no evidence of significant PAD on 01/13/16 04/23/16; culture I did last week grew enterococcus [ampicillin sensitive] and MRSA. He saw infectious disease yesterday. They stop the clindamycin and ordered an MRI. This is not unreasonable. All the hardware is out of the foot at this point. 04/30/16 at this point in time we are still awaiting the results of the MRI at this point in time. Patient did have an area which appears to be somewhat macerated in the proximal portion of the wound where  there is overlying necrotic skin that is doing nothing more than trapping fluid underneath. He continues to state that he does have some discomfort and again the concern is for the possibility of osteomyelitis hence the reason for the MRI order. We have been using a silver alginate dressing but again I think the reason this with his macerated as it was is that the dressing obscene could not reach the entirety of the wound due to some necrotic skin covering the proximal portion. No bleeding noted at this point in time on initial evaluation. 05/07/16 we receive the results of patient's MRI which shows that he has no evidence of osteomyelitis. This obviously is excellent news. Patient was definitely happy to hear this. He tells me the wound appears to be doing very well at this point in time and is pleased with the progress currently. 05/15/16; as noted the patient did not have osteomyelitis. He has been released by infectious disease and orthopedics. His wound is still open he had a debridement last week but complain when he got home he "bleeding for half a day". He is not had any pain. We have been using silver alginate with Kerlix gauze wrap. 05/28/16; patient arrives today with the wound in much the same condition as last time. He has a small opening on the lateral aspect of his foot however with debridement there is clear undermining medially there is no real evidence of infection  here and I didn't see any point in culturing this. One would have to wonder if this isn't a simple matter of in adequate offloading as he is been using a healing sandal. 06/05/16; the open area is now on the plantar aspect of his foot and not decide. The wound almost appears to have "migrated". This was the term use by our intake nurse. 06/12/16; open area on the plantar aspect of his foot. Base of the wound looks very healthy. This will be his second week in a total contact cast 06/19/16; patient arrived today in a total  contact cast. There was some expectation from our staff and myself that this area would be healed. Unfortunately the area was boggy and with rec pressure a fairly substantial amount of purulent drainage was obtained. Specimen obtained for culture. The patient had no complaints of systemic problems including fever or chills or instability of his diabetes. There was no pain in the foot. Nevertheless a extensive debridement was required. 06/26/16; patient's culture from the abscess last week grew a combination of MRSA and ampicillin sensitive enterococcus. I had him on Augmentin and Septra however I have elected to give him a full 10 day course of Zyvox instead as I Recently treated this combination of organisms with Augmentin and Septra before. Arrives today with no systemic symptoms 07/03/16; the patient has 2 more treatments of Zyvox and then he is finished antibiotics the wound has improved now mostly on the lateral aspect of his foot. There is still some tenderness when he walks. 07/10/16; patient arrives today with Zyvox completed. He only has a small open area remaining. 07/24/16; she arrives here with no overt open area. The covering is thick callus/eschar. Nevertheless there is no open area here. He has some tenderness underneath the area but no overt infection is observed no drainage. The patient has a deformity in the foot with this area weekly to be exposed to more pressure in his foot where nevertheless that something we are going to have to deal with going forward. The patient has diabetic workboots and diabetic shoes. He has had a long difficult course with the area here. This started as a fracture at work. He had bone grafting from his calcaneus and screws. This got infected he had to have more surgery on the area. Bone at the time of that surgery I think showed enterococcus and group B strep. He had 6 weeks of Rocephin. Since then the area has waxed and waned in its difficulty. Recently he  had an MRI in December that did not show osteomyelitis nevertheless he had an abscess that grew MRSA and enterococcus which I elected to treat with Zyvox. This was in December and this wound is actually "healed" over 08/21/16; the patient came in today for his one-month follow-up visit. The area on the lateral aspect of his left foot looks much the same as some month ago. There is no evidence of an open wound here. However the patient tells me about a week after he went back to work he developed severe pain and swelling in the plantar aspect of his right foot first and fifth metatarsal heads. 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 He went to see his podiatrist at [facility]. She told him that he could not work on his feet. She told him to go back and his cam walking boot on the left. Not have  open wounds obviously on the right. The patient is actually gone ahead and retired from his job because he does not feel he can work on his feet 09/18/16; patient comes back in saying he recently had some pain on the lateral aspect of his left foot. Asked that we look at this. Other than that all of his wounds have a viable surface. He has diabetic shoes. He is retired from work. READMISSION Stevens Magwood is a 61 year old man who we cared for for a prolonged period of time in late 2017 into March 2018. At that point he had suffered a fracture of his left fifth metatarsal at about the level of the base of the fifth metatarsal. He had surgical repair by Dr. Meryl Dare and he developed a very refractory wound over the fifth metatarsal. Ultimately this became infected he required hardware removal this eventually closed over and we discharged from the clinic in March of this year. He tells me as well until roughly 3 weeks ago when he developed increasing pain in the same area making it difficult for him to sleep. He was admitted to  hospital from 9/5 through 9/7 with now an ulcer in the same area on the lateral aspect of his left foot at the level of the base of the metatarsal. X- rays and MRIs during this hospitalization were negative. Wound culture showed a combination of Escherichia coli, Morganella and Pseudomonas. He was given IV Cipro and vancomycin the hospital and then discharged home on Cipro and Doxy. He returned to the ER on 9/16 with leg swelling and discomfort. I don't think anything was added at that point although he did have an ultrasound of the left leg that was negative for DVT He was back in the ER on 10/4 again with . pain in the area he was given Bactrim but states he had a significant allergic reaction to that which has abated once he stopped the Bactrim. I don't think he had a full course. He went to see his podiatrist yesterday who recommended some form of foot soak. He has a Darco forefoot offloading boot The patient is a type II diabetic on insulin. T me his recent hemoglobin A1c was 7. Arterial studies done in July 2017 showed an ABI in the right of 1.3 on the ells left 1.2 to biphasic waveforms bilaterally. He was not felt to have arterial disease. As noted the patient has had 2 MRIs most recently on 10/4 this showed no evidence of an abscess or osteomyelitis and no change since the prior study of 03/05/17 there was nonspecific edema on the dorsum of the foot. ABIs in this clinic today 1.2 which would be unchanged from his study done in July/17 04/15/17; now down to 1 small wound on the left lateral foot. We wrapped him and applied collagen last week and the foot is fairly macerated. 04/22/17; small wound on the left lateral foot however it has some undermining. Using collagen 04/29/17; small wound on the left lateral foot some surrounding callus. Chronic damage in this area as a result of initial fracture nonhealing and secondary infection. 05/06/17; the patient arrives today with the area on the left  lateral foot closed. There is thick subcutaneous tissue with a layer of callus. I took some of the callus off just to ensure adequate closure of the underlying tissue and there is no open wound here. He has considerable deformity of this area of his foot however and unless there is an ability to offload this  I think opening is going to be recurrent. He tells me he has diabetic foot wear although I have not actually seen this SEAVER, MACHIA ([id number]) [id number] Page 4 of 14 Readmission: 09/02/17 on evaluation today patient appears to be doing a little better compared to what he tells me what's going on in regard to his left lateral foot. He was previously seen in the ER this past Saturday it is now Tuesday. On Saturday he actually was treated with antibiotics including Cipro 500 mg two times a day and doxycycline 100 mg two times a day. Subsequently he also did have an x-ray performed of his left foot which revealed increased degenerative changes of the base of the fifth metatarsal since comparison in 2018. There was no radiographic evidence of osteomyelitis however. Patient has previously had an MRI of the left foot which was performed on 04/03/17 in this revealed at that point a soft tissue ulceration but no evidence of osteomyelitis. Patient has previously undergone testing in regard to his ABI's by Dr. Bridgett Larsson and this showed that he did have ABI was within normal limits which does correspond with ABI's we checked here in the office today. That study was on 01/13/16. With all that being said on evaluation today there does not appear to be any evidence of a true open wound of the left lateral fifth metatarsal region. He does have tenderness noted as well is a little bit of boggy/fluctuance feeling to the area in this region of his foot and there is definitely a very solid and firm callous noted laterally. With that being said I am not able to find any obvious opening at this  point there does appear to be a spot where there appears to been a opening very recently however patient states he has not noted any drainage from this currently. He does however have discomfort with palpation of this area this is in the 3-4/10 range only with very firm palpation this is not too significant at all. Subsequently he does have a small ulcer at the medial nail bed of the right second toe where he inadvertently pulled off a portion of the toenail softly causing this injury. Otherwise there does not appear to be any evidence of infection in regard to this right second toe. 09/23/17 on evaluation today patient actually appears to be doing well he has no open ulcerations noted at this point. With that being said he did have significant callous noted over the left lateral foot which did require callous pairing today he tolerated this without complication. Readmission: 11/07/17 patient presents today for readmission concerning to ulcers that he has. One on the right plantar fifth metatarsal region and the other on the left plantar fifth metatarsal region. He has previously had an x-ray which was performed on 11/05/17 and revealed that the patient had wounds noted of the base of the fifth metatarsal region without evidence of destruction to the bone to suggest osteomyelitis. Obviously this is excellent news. With that being said he does have a significantly large ulcer that extensively plantar surface of the wound and is concerning for the fact that he continues to walk on this due to the patient telling me that he "has to work" I do believe this has likely led to both the opening of the wound as well as the fact that is not really able to heal very well at this point. He has been seen by his podiatrist and apparently according to the note dated 10/23/17 the  patient was provided with Santyl and recommended a dry dressing. No wound culture has been obtained at this point. As stated above he has had  returned to work due to financial reasons and is not able to wear his postop shoes due to the fact that he cannot wear this and work. The wound is stated to have been present for "several weeks" prior to the visit with the podiatrist on 10/23/17. With that being said again at least the good news is there does not appear to be any evidence of osteomyelitis. 11/14/17 on evaluation today patient appears to be doing poorly in regard to his bilateral foot ulcers. Unfortunately he seems to have significant callous with a lot of maceration underlined especially on the right even more so than the left. He has been tolerating the dressing changes without complication. With that being said overall I do think he's gonna have to have a significant debridement today. 11/21/17 on evaluation today patient presents for follow-up concerning his bilateral lateral foot ulcers. He has been tolerating the dressing changes without complication. His wife helps perform these for him. With that being said the patient states that the left lateral foot ulcer actually is continuing to give him trouble as far as discomfort is concerned. With that being said actually appears better. 11/28/17 on evaluation today patient appears to be doing poorly in regard to his left lower extremity ulcer in particular. His right as made some progress on the lateral portion although the plantar portion is actually getting somewhat worse I think this is likely due to the fact that he continues to work although it hurts and he's been having a lot of issues out of this side and as far as pain is concerned. With that being said my biggest concern on evaluation today is that of the patient having issues with increased pain in the left lower extremity ulcer site especially on the plantar aspect. He also appears to have what seems to be an abscess developing in the dorsal/lateral portion of his foot which I think likely is communicating somewhere internally  with the ulcer site. Nonetheless he has not had any fevers, chills, nausea, or vomiting noted at this time. He has however had fairly significant increase in pain and he was Artie having a lot of pain previous. Readmission: 01-29-2022 upon evaluation today patient presents for initial inspection here in our clinic concerning the current issue which has been present since around Oct 29, 2021. The patient tells me that he has at this point been seen by Dr. Vickki Muff who has been managing this up to this point. He has also been seen by Dr. Lazaro Arms I will outline the findings there shortly as well. With that being said currently according to Dr. Alvera Singh note on 12-18-2021 the patient has a prescription for Zosyn that was given by infectious disease. Again that was back in June however. There was a hospital encounter for incision and drainage and debridement of the foot in order to clearway necrotic tissue. The patient also underwent vascular evaluation with Dr. Lucky Cowboy. Nonetheless on the postop visit on 12-18-2021 the patient was seeming to be doing quite well based on what Dr. Vickki Muff said but nonetheless since that time is really not made much progress and is concerned about losing his foot. He therefore requested a referral to wound care to be seen and evaluated and see if there is anything we can do to help and aid with getting this to heal. Fortunately there does not  appear to be any evidence of active infection locally or systemically at this time which is great news. He tells me that he is currently using Betadine wet-to-dry dressings. He does not note any systemic infections but does have evidence of local infection based on what we are seeing. He has had a recent chest x-ray which showed that he had no evidence of acute cardiopulmonary disease that was performed on 12-12-2021. He also underwent vascular intervention with Dr. 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 Subsequently the patient states that Dr. Lucky Cowboy has mentioned that he would need to go back at some time with Dr. Lucky Cowboy said he would consider a posterior tibial approach for revascularization in the future but at this time because of kidney status and the amount of dye used he did not want to proceed any further at that point. Again that something that would likely be scheduled down the road. Nonetheless right now the patient does have an ABI on the left of 0.90 with a TBI of 0.6-0 but this was decreased compared to previous. His ABI on the right was 1.08. Patient did have as well MRI of the left foot which was performed on 12-12-2021 and this shows that he does have interval resection of the proximal third of the fifth metatarsal and area of osteomyelitis on prior MRI. There is persistent ulceration of plantar lateral aspect at the resection site with a sinus tract extending towards the adjacent cuboid bone findings suspicious for osteomyelitis of the plantar lateral aspect of the cuboid and adjacent base of the fourth metatarsal possibly in the proximal most aspect of the remaining fifth metatarsal shaft as well. The ABI performed on 12-13-2021 showed that the left ABI was 0.83 which had previously been registered at 1.35 and the right ABI was 1.20. I think this is consistent with being able to heal but obviously would do better with vascular intervention not something that is going to be looked into in the future. This was noted above as well. Based on what I am seeing today I do believe that the patient is definitely a candidate for HBO therapy I think this may be in his best interest as I think he is at the point of being in a limb threatening situation. I do believe that he would benefit from hyperbarics along with continued antibiotics along with good wound care measures as well. 02-04-2022 upon  evaluation today patient appears to be doing still poorly in regard to his foot he is having a lot of drainage and he notes odor as well. With that being said we still have not heard anything back from insurance yet as far as approval for HBO therapy. Subsequently the patient does have evidence of osteomyelitis and I think hyperbarics is definitely going to be something that would be beneficial for him. He has been on antibiotics several times previous but unfortunately this just does not seem to be sustained enough we did do a culture it showed Pseudomonas as one of the primary organisms both organisms would be treated with Levaquin which I think is going to be the best way to go. I discussed that with the patient today and I Georgina Peer send that into the pharmacy. 02-11-2022 upon evaluation today patient appears to be doing well currently in regard to his  wound. I do feel like the Levaquin is doing better for him which is great news. Fortunately I do not see any evidence of active infection locally or systemically at this time which is great news. No fevers, chills, nausea, vomiting, or diarrhea. 02-19-2022 upon evaluation today patient appears to be doing excellent in regard to his foot ulcer all things considered he still has bone exposed but the good news is we are at least making some progress here I think he does have a lot of odor he tells me and I think switching over to Dakin's moistened gauze dressing is probably can be the way to go. 02-26-2022 upon evaluation today patient appears to be doing well currently in regard to his wound all things considered. Although the 1 thing I do see is that he is not changing the dressing as frequently as he should be. Fortunately there does not appear to be any signs of active infection he tells me his wife is out of town from Saturday through today so it has not been changed since Saturday before that they have been doing this every other day. I explained that  with the [facility], HIGIN[employer]OTH[employer] ([id number]) [id number] Page 5 of 34 Dakin's he needs to do this every day in order to make sure that it heals appropriately. This will also help with the odor which she has been complaining of. 03-11-2022 upon evaluation today patient appears to be doing better overall visually in regard to his wound. He is tolerating the dressing changes without complication. Fortunately there does not appear to be any evidence of active infection locally or systemically at this time. No fevers, chills, nausea, vomiting, or diarrhea. 03-19-2022 upon evaluation today patient appears to be doing decently well in regard to his wound. He is actually tolerating dressing changes without Complication. Fortunately there does not appear to be any signs of active infection locally or systemically at this time which is great news. No fevers, chills, nausea, vomiting, or diarrhea. 04-02-2022 upon evaluation today patient's wound is actually showing signs of significant improvement I am actually very pleased with where we stand today. I do not see any evidence of infection locally or systemically at this time which is great news. 04-08-2022 upon evaluation today patient actually appears to be making good progress. I am very pleased with where things stand his wound is smaller he seems to be tolerating hyperbarics well he is doing well with wound care and in general I think we are making excellent headway here. I do believe that he would likely benefit from extending the hyperbarics. I think that he is making such good progress I do not want to see anything backtrack on him at this point. Overall the benefit coupled with the antibiotics and an excellent wound care that we have been working with him with and that his wife is continuing to do at home I think he is making a great impact in getting this healed to prevent limb loss. 04-16-2022 upon evaluation today patient appears to  be doing excellent with the [facility] dressing. Coupled with hyperbarics he is managing quite nicely and healing quite nicely. Fortunately I do not see any evidence of active infection locally or systemically which is great news and overall I am extremely pleased with where we stand today. No fevers, chills, nausea, vomiting, or diarrhea. 04-23-2022 upon evaluation today patient appears to be doing well currently in regard to his wound for the most part although there is some area of  rubbing on the lateral aspect as well as the top of his foot. When questioned he actually has been more active over the weekend he tells me that it was his birthday and he had some friends over. Subsequently I think this is what led to things being somewhat worse compared to what they were previous. Fortunately I do not see any evidence of active infection locally or systemically at this time which is great news. No fevers, chills, nausea, vomiting, or diarrhea. We are still waiting on the approval for continuation of HBO therapy. 04-30-2022 upon evaluation today patient's wound is actually showing signs of improvement. He continues to utilize the knee scooter which I think is doing well for him. The volume continues to improve the size got a little worse last week due to the fact that he was up on this more. The good news is he is actually back down to more normal situation at this point today. There is some need for sharp debridement. 11/7 patient was seen in conjunction with HBO which she is tolerating well. HBO dive #47 today. Wound is on the left plantar foot on the lateral aspect. He has been making excellent progress. 05-14-2022 upon evaluation today patient appears to be doing well currently in regard to his wound. Has been tolerating the dressing changes without complication and overall I am extremely pleased with where we stand currently. There is no evidence of active infection locally nor systemically at  this time. 05-21-2022 upon evaluation today patient appears to be doing well currently in regard to his wound. He has been tolerating the dressing changes without complication. He does have some swelling and weeping on the lateral portion of his foot. With that being said I do believe that he needs to do something to try to get the swelling under control. I do think an Ace wrap may be best to go back to at this point as well. Fortunately I think other than this the wound is making excellent progress. 05-28-2022 upon evaluation today patient appears to be doing well currently for the most part in regard to his foot ulcer though he does have a lot of debridement that needs to be undertaken today he has a lot of callus buildup around the edges of the wound which I think is affecting his healing as well. Fortunately I do not see any signs of active infection locally nor systemically at this time which is great news. No fevers, chills, nausea, vomiting, or diarrhea. Electronic Signature(s) Signed: 05/28/2022 1:33:26 PM By: Worthy Keeler PA-C Entered By: Worthy Keeler on 05/28/2022 13:33:26 -------------------------------------------------------------------------------- Physical Exam Details Patient Name: Date of Service: [name] Medical Record Number: [id number] Patient Account Number: [id number] Date of Birth/Sex: Treating RN: 08-27-1960 (61 y.o. [name] Primary Care Provider: [name] Other Clinician: Massie Kluver Referring Provider: Treating Provider/Extender: [name] in Treatment: 15 Constitutional Well-nourished and well-hydrated in no acute distress. Respiratory normal breathing without difficulty. Psychiatric ZADE, FALKNER ([id number]) [id number] Page 6 of 14 this patient is able to make decisions and demonstrates good insight into disease process. Alert and Oriented x 3.  pleasant and cooperative. Notes Upon inspection patient's wound bed actually showed signs of good granulation and epithelization at this point though there was some need for fairly extensive sharp debridement which I performed today. Postdebridement wound bed actually appears to be doing much better and overall I am extremely pleased with where  we stand currently. No fevers, chills, nausea, vomiting, or diarrhea. Electronic Signature(s) Signed: 05/28/2022 1:33:44 PM By: Worthy Keeler PA-C Entered By: Worthy Keeler on 05/28/2022 13:33:44 -------------------------------------------------------------------------------- Physician Orders Details Patient Name: Date of Service: [name] Medical Record Number: [id number] Patient Account Number: [id number] Date of Birth/Sex: Treating RN: 11/01/1960 (61 y.o. [name]) Carlene Coria Primary Care Provider: [name] Other Clinician: Massie Kluver Referring Provider: Treating Provider/Extender: [name] in Treatment: [phone] Verbal / Phone Orders: No Diagnosis Coding ICD-10 Coding Code Description [phone] Chronic multifocal osteomyelitis, left ankle and foot E11.621 Type 2 diabetes mellitus with foot ulcer L97.524 Non-pressure chronic ulcer of other part of left foot with necrosis of bone T81.31XA Disruption of external operation (surgical) wound, not elsewhere classified, initial encounter Follow-up Appointments Return Appointment in 1 week. Bathing/ [employer] wounds with antibacterial soap and water. May shower; gently cleanse wound with antibacterial soap, rinse and pat dry prior to dressing wounds Anesthetic (Use 'Patient Medications' Section for Anesthetic Order Entry) Lidocaine applied to wound bed Edema Control - Lymphedema / Segmental Compressive Device / Other Elevate, Exercise Daily and A void Standing for Long Periods of Time. Elevate legs to the level of the heart and  pump ankles as often as possible Elevate leg(s) parallel to the floor when sitting. Off-Loading Foot Defender - ordered for left foot Open toe surgical shoe Other: - knee scooter Additional Orders / Instructions Vitamin A Vitamin C, Zinc - Use AandD oinment to leg/foot do not use in wounds ; Other: - need daily dressing changes Hyperbaric Oxygen Therapy Wound #14 Left,Lateral Foot Indication and location: - Left foot osteomyelitis If appropriate for treatment, begin HBOT per protocol: 2.0 ATA for 90 Minutes without A Breaks ir One treatment per day (delivered Monday through Friday unless otherwise specified in Special Instructions below): Total # of Treatments: - 40 additional 40 requested for a total of 80 A ntihistamine 30 minutes prior to HBO Treatment, difficulty clearing ears. Finger stick Blood Glucose Pre- and Post- HBOT Treatment. JULLIAN, CLAYSON ([id number]) [id number] Page 7 of 14 Follow Hyperbaric Oxygen Glycemia Protocol Wound Treatment Wound #14 - Foot Wound Laterality: Left, Lateral Cleanser: [employer]ram Ancillary Kit - 15 Day Supply (Generic) 3 x Per Week/30 Days Discharge Instructions: Use supplies as instructed; Kit contains: (15) Saline Bullets; (15) 3x3 Gauze; 15 pr Gloves Cleanser: Wound Cleanser 3 x Per Week/30 Days Discharge Instructions: Wash your hands with soap and water. Remove old dressing, discard into plastic bag and place into trash. Cleanse the wound with Wound Cleanser prior to applying a clean dressing using gauze sponges, not tissues or cotton balls. Do not scrub or use excessive force. Pat dry using gauze sponges, not tissue or cotton balls. Peri-Wound Care: AandD Ointment 3 x Per Week/30 Days Discharge Instructions: Apply AandD Ointment as directed Prim Dressing: Hydrofera Blue Ready Transfer Foam, 4x5 (in/in) (Dispense As Written) 3 x Per Week/30 Days ary Discharge Instructions: Apply Hydrofera Blue Ready to wound bed as  directed Secondary Dressing: ABD Pad 5x9 (in/in) (Generic) 3 x Per Week/30 Days Discharge Instructions: Cover with ABD pad Secured With: ACE WRAP - 64M ACE Elastic Bandage With VELCRO Brand Closure, 4 (in) 3 x Per Week/30 Days Secured With: Medipore T - 64M Medipore H Soft Cloth Surgical T ape ape, 2x2 (in/yd) (Generic) 3 x Per Week/30 Days Secured With: [employer] Sterile or Non-Sterile 6-ply 4.5x4 (yd/yd) (Generic) 3 x Per Week/30 Days Discharge  Instructions: Apply Kerlix as directed GLYCEMIA INTERVENTIONS PROTOCOL PRE-HBO GLYCEMIA INTERVENTIONS ACTION INTERVENTION Obtain pre-HBO capillary blood glucose (ensure 1 physician order is in chart). A. Notify HBO physician and await physician orders. 2 If result is 70 mg/dl or below: B. If the result meets the hospital definition of a critical result, follow hospital policy. A. Give patient an 8 ounce Glucerna Shake, an 8 ounce Ensure, or 8 ounces of a Glucerna/Ensure equivalent dietary supplement*. B. Wait 30 minutes. If result is 71 mg/dl to 130 mg/dl: C. Retest patients capillary blood glucose (CBG). D. If result greater than or equal to 110 mg/dl, proceed with HBO. If result less than 110 mg/dl, notify HBO physician and consider holding HBO. If result is 131 mg/dl to 249 mg/dl: A. Proceed with HBO. A. Notify HBO physician and await physician orders. B. It is recommended to hold HBO and do If result is 250 mg/dl or greater: blood/urine ketone testing. C. If the result meets the hospital definition of a critical result, follow hospital policy. POST-HBO GLYCEMIA INTERVENTIONS ACTION INTERVENTION Obtain post HBO capillary blood glucose (ensure 1 physician order is in chart). A. Notify HBO physician and await physician orders. 2 If result is 70 mg/dl or below: B. If the result meets the hospital definition of a critical result, follow hospital policy. A. Give patient an 8 ounce Glucerna Shake, an 8 ounce Ensure, or 8  ounces of a Glucerna/Ensure equivalent dietary supplement*. B. Wait 15 minutes for symptoms of If result is 71 mg/dl to 100 mg/dl: hypoglycemia (i.e. nervousness, anxiety, sweating, chills, clamminess, irritability, confusion, tachycardia or dizziness). C. If patient asymptomatic, discharge patient. If patient symptomatic, repeat capillary blood glucose (CBG) and notify HBO physician. If result is 101 mg/dl to 249 mg/dl: A. Discharge patient. A. Notify HBO physician and await physician orders. B. It is recommended to do blood/urine ketone If result is 250 mg/dl or greater: testing. C. If the result meets the hospital definition of a critical result, follow hospital policy. *Juice or candies are NOT equivalent products. If patient refuses the Glucerna or Ensure, please consult the hospital CARLISLE, ENKE ([id number]) [id number] Page 8 of 14 dietitian for an appropriate substitute. Electronic Signature(s) Signed: 05/28/2022 4:53:03 PM By: Worthy Keeler PA-C Signed: 05/28/2022 5:05:44 PM By: Massie Kluver Entered By: Massie Kluver on 05/28/2022 11:29:14 -------------------------------------------------------------------------------- Problem List Details Patient Name: Date of Service: [name] Medical Record Number: [id number] Patient Account Number: [id number] Date of Birth/Sex: Treating RN: [birth year] (61 y.o. [name]) Carlene Coria Primary Care Provider: [name] Other Clinician: Massie Kluver Referring Provider: Treating Provider/Extender: [name] in Treatment: 17 Active Problems ICD-10 Encounter Code Description Active Date MDM Diagnosis [id number] Chronic multifocal osteomyelitis, left ankle and foot 01/29/2022 No Yes E11.621 Type 2 diabetes mellitus with foot ulcer 01/29/2022 No Yes L97.524 Non-pressure chronic ulcer of other part of left foot with necrosis of bone 01/29/2022 No  Yes T81.31XA Disruption of external operation (surgical) wound, not elsewhere classified, 01/29/2022 No Yes initial encounter Inactive Problems Resolved Problems Electronic Signature(s) Signed: 05/28/2022 10:38:58 AM By: Worthy Keeler PA-C Entered By: Worthy Keeler on 05/28/2022 10:38:58 [name] ([id number]) [id number] Page 9 of 14 -------------------------------------------------------------------------------- Progress Note Details Patient Name: Date of Service: Arie Sabina [name] [name]. 05/28/2022 10:30 A [name] Medical Record Number: [id number] Patient Account Number: [id number] Date of Birth/Sex: Treating RN: [birth year] (61 y.o. [name]) Carlene Coria Primary Care Provider: [name] Other Clinician:  Massie Kluver Referring Provider: Treating Provider/Extender: [name] in Treatment: 17 Subjective Chief Complaint Information obtained from Patient Left lateral foot ulcer History of Present Illness (HPI) Pleasant 61 year old with history of diabetes (Hgb A1c 10.8 in 2014) and peripheral neuropathy. No PVD. L ABI 1.1. Status post right great toe partial amputation years ago. He was at work and on 10/22/2014, was injured by a cart, and suffered an ulceration to his left anterior calf. He says that it subsequently became infected, and he was treated with a course of antibiotics. He was found on initial exam to have an ulceration on the dorsum of his left third toe. He was unaware of this and attributes it to pressure from his steel toed boots. More recently he injured his right anterior calf on a cart. Ambulating normally per his baseline. He has been undergoing regular debridements, applying mupirocin cream, and an Ace wrap for edema control. He returns to clinic for follow-up and is without complaints. No pain. No fever or chills. No drainage. 10/25/15; this is a 61 year old man who has type II diabetes with diabetic polyneuropathy.  He tells me that he fractured his left fifth metatarsal in June 2016 when he presented with swelling. He does not recall a specific injury. His hemoglobin A1c was apparently too high at the time for consideration of surgery and he was put in some form of offloading. Ultimately he went to surgery in December with an allograft from his calcaneus to this site, plate and screws. He had an x-ray of the foot in March that showed concerns about nonunion. He tells me that in March he had to move and basically moved himself. He was on his foot a lot and then noticed some drainage from an open area. He has been following with his orthopedic surgeon Dr. Doran Durand. He has been applying a felt donut, dry dressing and using his heel healing sandal. 11/01/15; this is a patient I saw last week for the first time. He had a small open wound on the plantar aspect of his left foot at roughly the level of the base of his fifth metatarsal. He had a considerable degree of thickened skin around this wound on the plantar aspect which I thought was from chronic pressure on this area. He tells [place] that he had drainage over the course of the week. No systemic symptoms. 11/08/15; culture last week grew Citrobacter korseri. This should've been sensitive to the Augmentin I gave him. He has seen Dr. Doran Durand who did his initial surgery and according the patient the plan is to give this another month and then the hardware might need to come out of this. This seems like a reasonable plan. I will adjust his antibiotics to ciprofloxacin which probably should continue for at least another 2 weeks. I gave him 10 days worth today 11/22/15 the patient has completed antibiotics. He has an appointment with Dr. Doran Durand this Friday. There is improved dimensions around the wound on the left fifth metatarsal base 11/29/15; the patient has completed antibiotics last week. Apparently his appointment with Dr. Doran Durand it is not until this Friday. Dimensions are  roughly the same. 12/06/15; saw Dr. Doran Durand. No x-ray told the end of the month, next appointment June 30. We have been using Aquacel Ag 12/13/15: No major change this week. Using Aquacel AG 621/17; arrived this week with maceration around the wound. There was quite a bit of undermining which required surgical debridement. I changed him to [facility] last  week, by the patient's admission he was up on this more this week 12/27/15; macerated tissue around the wound is removed with a scalpel and pickups. There is no undermining. Nonviable subcutaneous tissue and skin taken from the superior circumference of the wound is slough from the surface. READMISSION 03/06/16 since I last saw this patient at the end of June, he went for surgery on 01/11/16 by Dr. Doran Durand of orthopedics. He had a left foot irrigation and debridement, removal of hardware and placement of wound VAC. He is also been followed by Dr. Megan Salon of infectious disease and completed a six-week course of IV Rocephin for group B strep and Enterobacter in the bone at the time of surgery. Apparently at the time of surgery the bone looked healthy so I don't think any bone was actually removed. He has been using silver alginate based dressings on the same wound area at the base of the left fifth metatarsal on the left. I note that he is also had arterial studies on 01/08/16, these showed a left ABI of 1.2 to and a right ABI of 1.3. Waveforms were listed as biphasic. He was not felt to have any specific arterial issues. 03/13/16; no real change in the condition of the wound at the left lateral foot at roughly the base of his fifth metatarsal. Use silver alginate last week. 03/18/16 arrives today with no open area. Being suspicious of the overlying callus I. Some of this back although I see nothing but covering tissue here/epithelium. There is no surrounding tenderness READMISSION 04/16/16 this is a patient I discharged about a month ago. Initially a  surgical wound on the lateral aspect of his left foot which subsequently became infected. The story he is giving today that he went back into his own modified shoe started to notice pain 2 weeks ago he was seen in Dr. Nona Dell office by a physician assistant last Wednesday and according the patient was told that everything looked fine however this is clearly now broken open and he has an open wound in the same spot that we have been dealing with repetitively. Situation is complicated by the fact that he is running short of money on long-term disability. He has not taken his insulin and at least 2 weeks was previously on NovoLog short acting insulin on a sliding scale and TRESIBA 35 units at bedtime. He is no longer able to afford any of his medication he was in the x-ray on 10/15. A plain x-ray showed healed fracture of the left fifth metatarsal bone status post removal of the associated plate and screw fixation hardware. There was no acute appearing osseous abnormality. His blood work showed a white count of 9.2 with an essentially normal differential comprehensive metabolic panel was normal. Previous CT scan of the foot on 01/08/16 showed no osteomyelitis previous vascular workup showed no evidence of significant PAD on 01/13/16 04/23/16; culture I did last week grew enterococcus [ampicillin sensitive] and MRSA. He saw infectious disease yesterday. They stop the clindamycin and MACAULEY, MOSSBERG ([id number]) [id number] Page 10 of 14 ordered an MRI. This is not unreasonable. All the hardware is out of the foot at this point. 04/30/16 at this point in time we are still awaiting the results of the MRI at this point in time. Patient did have an area which appears to be somewhat macerated in the proximal portion of the wound where there is overlying necrotic skin that is doing nothing more than trapping fluid underneath. He continues to state  that he does have some discomfort and  again the concern is for the possibility of osteomyelitis hence the reason for the MRI order. We have been using a silver alginate dressing but again I think the reason this with his macerated as it was is that the dressing obscene could not reach the entirety of the wound due to some necrotic skin covering the proximal portion. No bleeding noted at this point in time on initial evaluation. 05/07/16 we receive the results of patient's MRI which shows that he has no evidence of osteomyelitis. This obviously is excellent news. Patient was definitely happy to hear this. He tells me the wound appears to be doing very well at this point in time and is pleased with the progress currently. 05/15/16; as noted the patient did not have osteomyelitis. He has been released by infectious disease and orthopedics. His wound is still open he had a debridement last week but complain when he got home he "bleeding for half a day". He is not had any pain. We have been using silver alginate with Kerlix gauze wrap. 05/28/16; patient arrives today with the wound in much the same condition as last time. He has a small opening on the lateral aspect of his foot however with debridement there is clear undermining medially there is no real evidence of infection here and I didn't see any point in culturing this. One would have to wonder if this isn't a simple matter of in adequate offloading as he is been using a healing sandal. 06/05/16; the open area is now on the plantar aspect of his foot and not decide. The wound almost appears to have "migrated". This was the term use by our intake nurse. 06/12/16; open area on the plantar aspect of his foot. Base of the wound looks very healthy. This will be his second week in a total contact cast 06/19/16; patient arrived today in a total contact cast. There was some expectation from our staff and myself that this area would be healed. Unfortunately the area was boggy and with rec pressure  a fairly substantial amount of purulent drainage was obtained. Specimen obtained for culture. The patient had no complaints of systemic problems including fever or chills or instability of his diabetes. There was no pain in the foot. Nevertheless a extensive debridement was required. 06/26/16; patient's culture from the abscess last week grew a combination of MRSA and ampicillin sensitive enterococcus. I had him on Augmentin and Septra however I have elected to give him a full 10 day course of Zyvox instead as I Recently treated this combination of organisms with Augmentin and Septra before. Arrives today with no systemic symptoms 07/03/16; the patient has 2 more treatments of Zyvox and then he is finished antibiotics the wound has improved now mostly on the lateral aspect of his foot. There is still some tenderness when he walks. 07/10/16; patient arrives today with Zyvox completed. He only has a small open area remaining. 07/24/16; she arrives here with no overt open area. The covering is thick callus/eschar. Nevertheless there is no open area here. He has some tenderness underneath the area but no overt infection is observed no drainage. The patient has a deformity in the foot with this area weekly to be exposed to more pressure in his foot where nevertheless that something we are going to have to deal with going forward. The patient has diabetic workboots and diabetic shoes. He has had a long difficult course with the area here. This started  as a fracture at work. He had bone grafting from his calcaneus and screws. This got infected he had to have more surgery on the area. Bone at the time of that surgery I think showed enterococcus and group B strep. He had 6 weeks of Rocephin. Since then the area has waxed and waned in its difficulty. Recently he had an MRI in December that did not show osteomyelitis nevertheless he had an abscess that grew MRSA and enterococcus which I elected to treat with Zyvox.  This was in December and this wound is actually "healed" over 08/21/16; the patient came in today for his one-month follow-up visit. The area on the lateral aspect of his left foot looks much the same as some month ago. There is no evidence of an open wound here. However the patient tells me about a week after he went back to work he developed severe pain and swelling in the plantar aspect of his right foot first and fifth metatarsal heads. 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 He went to see his podiatrist at [facility]. She told him that he could not work on his feet. She told him to go back and his cam walking boot on the left. Not have open wounds obviously on the right. The patient is actually gone ahead and retired from his job because he does not feel he can work on his feet 09/18/16; patient comes back in saying he recently had some pain on the lateral aspect of his left foot. Asked that we look at this. Other than that all of his wounds have a viable surface. He has diabetic shoes. He is retired from work. READMISSION [name] is a 61 year old man who we cared for for a prolonged period of time in late 2017 into March 2018. At that point he had suffered a fracture of his left fifth metatarsal at about the level of the base of the fifth metatarsal. He had surgical repair by Dr. Meryl Dare and he developed a very refractory wound over the fifth metatarsal. Ultimately this became infected he required hardware removal this eventually closed over and we discharged from the clinic in March of this year. He tells me as well until roughly 3 weeks ago when he developed increasing pain in the same area making it difficult for him to sleep. He was admitted to hospital from 9/5 through 9/7 with now an ulcer in the same area on the lateral aspect of his left foot at the level of the base of the metatarsal. X- rays  and MRIs during this hospitalization were negative. Wound culture showed a combination of Escherichia coli, Morganella and Pseudomonas. He was given IV Cipro and vancomycin the hospital and then discharged home on Cipro and Doxy. He returned to the ER on 9/16 with leg swelling and discomfort. I don't think anything was added at that point although he did have an ultrasound of the left leg that was negative for DVT He was back in the ER on 10/4 again with . pain in the area he was given Bactrim but states he had a significant allergic reaction to that which has abated once he stopped the Bactrim. I don't think he had a full course. He went to see his podiatrist yesterday who recommended some form of foot soak. He has a Darco forefoot offloading boot The patient is a type II diabetic on insulin. T  me his recent hemoglobin A1c was 7. Arterial studies done in July 2017 showed an ABI in the right of 1.3 on the ells left 1.2 to biphasic waveforms bilaterally. He was not felt to have arterial disease. As noted the patient has had 2 MRIs most recently on 10/4 this showed no evidence of an abscess or osteomyelitis and no change since the prior study of 03/05/17 there was nonspecific edema on the dorsum of the foot. ABIs in this clinic today 1.2 which would be unchanged from his study done in July/17 04/15/17; now down to 1 small wound on the left lateral foot. We wrapped him and applied collagen last week and the foot is fairly macerated. 04/22/17; small wound on the left lateral foot however it has some undermining. Using collagen 04/29/17; small wound on the left lateral foot some surrounding callus. Chronic damage in this area as a result of initial fracture nonhealing and secondary infection. 05/06/17; the patient arrives today with the area on the left lateral foot closed. There is thick subcutaneous tissue with a layer of callus. I took some of the callus off just to ensure adequate closure of the  underlying tissue and there is no open wound here. He has considerable deformity of this area of his foot however and unless there is an ability to offload this I think opening is going to be recurrent. He tells me he has diabetic foot wear although I have not actually seen this Readmission: 09/02/17 on evaluation today patient appears to be doing a little better compared to what he tells me what's going on in regard to his left lateral foot. He was previously seen in the ER this past Saturday it is now Tuesday. On Saturday he actually was treated with antibiotics including Cipro 500 mg two times a day and doxycycline 100 mg two times a day. Subsequently he also did have an x-ray performed of his left foot which revealed increased degenerative changes of the base of the fifth metatarsal since comparison in 2018. There was no radiographic evidence of osteomyelitis however. Patient has previously had an MRI of the left foot which was performed on 04/03/17 in this revealed at that point a soft tissue ulceration but no evidence of osteomyelitis. Patient has previously undergone testing in regard to his ABI's by Dr. Bridgett Larsson and this showed that he did have ABI was within normal limits which does correspond with ABI's we checked here in the office today. That study was on 01/13/16. With all that being said on evaluation today there does not appear to be any evidence of a true open wound of the left lateral fifth metatarsal region. He does have tenderness noted as well is a little bit of boggy/fluctuance feeling to the area in this region of his foot and there is definitely a very solid and firm callous noted laterally. With that being said I am not able to find any obvious opening at this point there does appear to be a spot where there appears to been a opening very recently however patient states he has not noted any drainage from this currently. He does however have discomfort with palpation of this area this is  in the 3-4/10 range only with very firm palpation this is not too significant at all. Subsequently he does have a small ulcer at the medial nail bed of the right second toe where he inadvertently pulled off a portion of the toenail softly causing this injury. Otherwise there does not appear to  be any evidence of infection in regard to this right second toe. 09/23/17 on evaluation today patient actually appears to be doing well he has no open ulcerations noted at this point. With that being said he did have significant callous noted over the left lateral foot which did require callous pairing today he tolerated this without complication. Readmission: DASTAN, KRIDER ([id number]) [id number] Page 11 of 14 11/07/17 patient presents today for readmission concerning to ulcers that he has. One on the right plantar fifth metatarsal region and the other on the left plantar fifth metatarsal region. He has previously had an x-ray which was performed on 11/05/17 and revealed that the patient had wounds noted of the base of the fifth metatarsal region without evidence of destruction to the bone to suggest osteomyelitis. Obviously this is excellent news. With that being said he does have a significantly large ulcer that extensively plantar surface of the wound and is concerning for the fact that he continues to walk on this due to the patient telling me that he "has to work" I do believe this has likely led to both the opening of the wound as well as the fact that is not really able to heal very well at this point. He has been seen by his podiatrist and apparently according to the note dated 10/23/17 the patient was provided with Santyl and recommended a dry dressing. No wound culture has been obtained at this point. As stated above he has had returned to work due to financial reasons and is not able to wear his postop shoes due to the fact that he cannot wear this and work. The wound is  stated to have been present for "several weeks" prior to the visit with the podiatrist on 10/23/17. With that being said again at least the good news is there does not appear to be any evidence of osteomyelitis. 11/14/17 on evaluation today patient appears to be doing poorly in regard to his bilateral foot ulcers. Unfortunately he seems to have significant callous with a lot of maceration underlined especially on the right even more so than the left. He has been tolerating the dressing changes without complication. With that being said overall I do think he's gonna have to have a significant debridement today. 11/21/17 on evaluation today patient presents for follow-up concerning his bilateral lateral foot ulcers. He has been tolerating the dressing changes without complication. His wife helps perform these for him. With that being said the patient states that the left lateral foot ulcer actually is continuing to give him trouble as far as discomfort is concerned. With that being said actually appears better. 11/28/17 on evaluation today patient appears to be doing poorly in regard to his left lower extremity ulcer in particular. His right as made some progress on the lateral portion although the plantar portion is actually getting somewhat worse I think this is likely due to the fact that he continues to work although it hurts and he's been having a lot of issues out of this side and as far as pain is concerned. With that being said my biggest concern on evaluation today is that of the patient having issues with increased pain in the left lower extremity ulcer site especially on the plantar aspect. He also appears to have what seems to be an abscess developing in the dorsal/lateral portion of his foot which I think likely is communicating somewhere internally with the ulcer site. Nonetheless he has not had any fevers, chills, nausea, or  vomiting noted at this time. He has however had fairly significant  increase in pain and he was Artie having a lot of pain previous. Readmission: 01-29-2022 upon evaluation today patient presents for initial inspection here in our clinic concerning the current issue which has been present since around Oct 29, 2021. The patient tells me that he has at this point been seen by Dr. Vickki Muff who has been managing this up to this point. He has also been seen by Dr. Lazaro Arms I will outline the findings there shortly as well. With that being said currently according to Dr. Alvera Singh note on 12-18-2021 the patient has a prescription for Zosyn that was given by infectious disease. Again that was back in June however. There was a hospital encounter for incision and drainage and debridement of the foot in order to clearway necrotic tissue. The patient also underwent vascular evaluation with Dr. Lucky Cowboy. Nonetheless on the postop visit on 12-18-2021 the patient was seeming to be doing quite well based on what Dr. Vickki Muff said but nonetheless since that time is really not made much progress and is concerned about losing his foot. He therefore requested a referral to wound care to be seen and evaluated and see if there is anything we can do to help and aid with getting this to heal. Fortunately there does not appear to be any evidence of active infection locally or systemically at this time which is great news. He tells me that he is currently using Betadine wet-to-dry dressings. He does not note any systemic infections but does have evidence of local infection based on what we are seeing. He has had a recent chest x-ray which showed that he had no evidence of acute cardiopulmonary disease that was performed on 12-12-2021. He also underwent vascular intervention with Dr. 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 Subsequently the patient states that  Dr. Lucky Cowboy has mentioned that he would need to go back at some time with Dr. Lucky Cowboy said he would consider a posterior tibial approach for revascularization in the future but at this time because of kidney status and the amount of dye used he did not want to proceed any further at that point. Again that something that would likely be scheduled down the road. Nonetheless right now the patient does have an ABI on the left of 0.90 with a TBI of 0.6-0 but this was decreased compared to previous. His ABI on the right was 1.08. Patient did have as well MRI of the left foot which was performed on 12-12-2021 and this shows that he does have interval resection of the proximal third of the fifth metatarsal and area of osteomyelitis on prior MRI. There is persistent ulceration of plantar lateral aspect at the resection site with a sinus tract extending towards the adjacent cuboid bone findings suspicious for osteomyelitis of the plantar lateral aspect of the cuboid and adjacent base of the fourth metatarsal possibly in the proximal most aspect of the remaining fifth metatarsal shaft as well. The ABI performed on 12-13-2021 showed that the left ABI was 0.83 which had previously been registered at 1.35 and the right ABI was 1.20. I think this is consistent with being able to heal but obviously would do better with vascular intervention not something that is going to be looked into in the  future. This was noted above as well. Based on what I am seeing today I do believe that the patient is definitely a candidate for HBO therapy I think this may be in his best interest as I think he is at the point of being in a limb threatening situation. I do believe that he would benefit from hyperbarics along with continued antibiotics along with good wound care measures as well. 02-04-2022 upon evaluation today patient appears to be doing still poorly in regard to his foot he is having a lot of drainage and he notes odor as well. With  that being said we still have not heard anything back from insurance yet as far as approval for HBO therapy. Subsequently the patient does have evidence of osteomyelitis and I think hyperbarics is definitely going to be something that would be beneficial for him. He has been on antibiotics several times previous but unfortunately this just does not seem to be sustained enough we did do a culture it showed Pseudomonas as one of the primary organisms both organisms would be treated with Levaquin which I think is going to be the best way to go. I discussed that with the patient today and I Georgina Peer send that into the pharmacy. 02-11-2022 upon evaluation today patient appears to be doing well currently in regard to his wound. I do feel like the Levaquin is doing better for him which is great news. Fortunately I do not see any evidence of active infection locally or systemically at this time which is great news. No fevers, chills, nausea, vomiting, or diarrhea. 02-19-2022 upon evaluation today patient appears to be doing excellent in regard to his foot ulcer all things considered he still has bone exposed but the good news is we are at least making some progress here I think he does have a lot of odor he tells me and I think switching over to Dakin's moistened gauze dressing is probably can be the way to go. 02-26-2022 upon evaluation today patient appears to be doing well currently in regard to his wound all things considered. Although the 1 thing I do see is that he is not changing the dressing as frequently as he should be. Fortunately there does not appear to be any signs of active infection he tells me his wife is out of town from Saturday through today so it has not been changed since Saturday before that they have been doing this every other day. I explained that with the Dakin's he needs to do this every day in order to make sure that it heals appropriately. This will also help with the odor which she has  been complaining of. 03-11-2022 upon evaluation today patient appears to be doing better overall visually in regard to his wound. He is tolerating the dressing changes without complication. Fortunately there does not appear to be any evidence of active infection locally or systemically at this time. No fevers, chills, nausea, vomiting, or diarrhea. 03-19-2022 upon evaluation today patient appears to be doing decently well in regard to his wound. He is actually tolerating dressing changes without Complication. Fortunately there does not appear to be any signs of active infection locally or systemically at this time which is great news. No fevers, chills, nausea, vomiting, or diarrhea. 04-02-2022 upon evaluation today patient's wound is actually showing signs of significant improvement I am actually very pleased with where we stand today. I do not see any evidence of infection locally or systemically at this time which is  great news. 04-08-2022 upon evaluation today patient actually appears to be making good progress. I am very pleased with where things stand his wound is smaller he seems to be tolerating hyperbarics well he is doing well with wound care and in general I think we are making excellent headway here. I do believe that he would likely benefit from extending the hyperbarics. I think that he is making such good progress I do not want to see anything backtrack on him at this point. Overall the benefit coupled with the antibiotics and an excellent wound care that we have been working with him with and that his wife is continuing to do at home I think he is making a great impact in getting this healed to prevent limb loss. 04-16-2022 upon evaluation today patient appears to be doing excellent with the [facility] dressing. Coupled with hyperbarics he is managing quite nicely NASHTON, BELSON ([id number]) [id number] Page 12 of 14 and healing quite nicely. Fortunately I  do not see any evidence of active infection locally or systemically which is great news and overall I am extremely pleased with where we stand today. No fevers, chills, nausea, vomiting, or diarrhea. 04-23-2022 upon evaluation today patient appears to be doing well currently in regard to his wound for the most part although there is some area of rubbing on the lateral aspect as well as the top of his foot. When questioned he actually has been more active over the weekend he tells me that it was his birthday and he had some friends over. Subsequently I think this is what led to things being somewhat worse compared to what they were previous. Fortunately I do not see any evidence of active infection locally or systemically at this time which is great news. No fevers, chills, nausea, vomiting, or diarrhea. We are still waiting on the approval for continuation of HBO therapy. 04-30-2022 upon evaluation today patient's wound is actually showing signs of improvement. He continues to utilize the knee scooter which I think is doing well for him. The volume continues to improve the size got a little worse last week due to the fact that he was up on this more. The good news is he is actually back down to more normal situation at this point today. There is some need for sharp debridement. 11/7 patient was seen in conjunction with HBO which she is tolerating well. HBO dive #47 today. Wound is on the left plantar foot on the lateral aspect. He has been making excellent progress. 05-14-2022 upon evaluation today patient appears to be doing well currently in regard to his wound. Has been tolerating the dressing changes without complication and overall I am extremely pleased with where we stand currently. There is no evidence of active infection locally nor systemically at this time. 05-21-2022 upon evaluation today patient appears to be doing well currently in regard to his wound. He has been tolerating the dressing  changes without complication. He does have some swelling and weeping on the lateral portion of his foot. With that being said I do believe that he needs to do something to try to get the swelling under control. I do think an Ace wrap may be best to go back to at this point as well. Fortunately I think other than this the wound is making excellent progress. 05-28-2022 upon evaluation today patient appears to be doing well currently for the most part in regard to his foot ulcer though he does have a lot of  debridement that needs to be undertaken today he has a lot of callus buildup around the edges of the wound which I think is affecting his healing as well. Fortunately I do not see any signs of active infection locally nor systemically at this time which is great news. No fevers, chills, nausea, vomiting, or diarrhea. Objective Constitutional Well-nourished and well-hydrated in no acute distress. Vitals Time Taken: 10:48 AM, Height: 70 in, Weight: 255 lbs, BMI: 36.6, Temperature: 97.8 [name], Pulse: 106 bpm, Respiratory Rate: 18 breaths/min, Blood Pressure: 178/88 mmHg. Respiratory normal breathing without difficulty. Psychiatric this patient is able to make decisions and demonstrates good insight into disease process. Alert and Oriented x 3. pleasant and cooperative. General Notes: Upon inspection patient's wound bed actually showed signs of good granulation and epithelization at this point though there was some need for fairly extensive sharp debridement which I performed today. Postdebridement wound bed actually appears to be doing much better and overall I am extremely pleased with where we stand currently. No fevers, chills, nausea, vomiting, or diarrhea. Integumentary (Hair, Skin) Wound #14 status is Open. Original cause of wound was Blister. The date acquired was: 10/29/2021. The wound has been in treatment 17 weeks. The wound is located on the Left,Lateral Foot. The wound measures 4.1cm  length x 2cm width x 0.3cm depth; 6.44cm^2 area and 1.932cm^3 volume. There is muscle and Fat Layer (Subcutaneous Tissue) exposed. There is no tunneling noted, however, there is undermining starting at 2:00 and ending at 5:00 with a maximum distance of 1.8cm. There is a medium amount of serosanguineous drainage noted. The wound margin is epibole. There is large (67-100%) red granulation within the wound bed. There is a small (1-33%) amount of necrotic tissue within the wound bed including Adherent Slough. 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 There was a Excisional Skin/Subcutaneous Tissue Debridement with a total area of 24 sq cm performed by Tommie Sams., PA-C. With the following instrument(s): Curette to remove Viable and Non-Viable tissue/material. Material removed includes Callus, Subcutaneous Tissue, and Slough. TERY, HOEGER ([id number]) [id number] Page 13 of 14 time out was conducted at 11:15, prior to the start of the procedure. A Minimum amount of bleeding was controlled with Pressure. The procedure was tolerated well. Post Debridement Measurements: 4.5cm length x 2.3cm width x 0.3cm depth; 2.439cm^3 volume. Character of Wound/Ulcer Post Debridement is stable. Severity of Tissue Post Debridement is: Fat layer exposed. Post procedure Diagnosis Wound #14: Same as Pre-Procedure Plan Follow-up Appointments: Return Appointment in 1 week. Bathing/ Shower/ Hygiene: Wash wounds with antibacterial soap  and water. May shower; gently cleanse wound with antibacterial soap, rinse and pat dry prior to dressing wounds Anesthetic (Use 'Patient Medications' Section for Anesthetic Order Entry): Lidocaine applied to wound bed Edema Control - Lymphedema / Segmental Compressive Device / Other: Elevate, Exercise Daily and Avoid Standing for Long Periods of Time. Elevate legs to the level of the heart and pump ankles as often as possible Elevate leg(s) parallel to the floor when sitting. Off-Loading: Foot Defender  - ordered for left foot Open toe surgical shoe Other: - knee scooter Additional  Orders / Instructions: Vitamin A; Vitamin C, Zinc - Use AandD oinment to leg/foot do not use in wounds Other: - need daily dressing changes Hyperbaric Oxygen Therapy: Wound #14 Left,Lateral Foot: Indication and location: - Left foot osteomyelitis If appropriate for treatment, begin HBOT per protocol: 2.0 ATA for 90 Minutes without Air Breaks One treatment per day (delivered Monday through Friday unless otherwise specified in Special Instructions below): T # of Treatments: - 40 additional 40 requested for a total of 80 otal Antihistamine 30 minutes prior to HBO Treatment, difficulty clearing ears. Finger stick Blood Glucose Pre- and Post- HBOT Treatment. Follow Hyperbaric Oxygen Glycemia Protocol WOUND #14: - Foot Wound Laterality: Left, Lateral Cleanser: [employer]ram Ancillary Kit - 15 Day Supply (Generic) 3 x Per Week/30 Days Discharge Instructions: Use supplies as instructed; Kit contains: (15) Saline Bullets; (15) 3x3 Gauze; 15 pr Gloves Cleanser: Wound Cleanser 3 x Per Week/30 Days Discharge Instructions: Wash your hands with soap and water. Remove old dressing, discard into plastic bag and place into trash. Cleanse the wound with Wound Cleanser prior to applying a clean dressing using gauze sponges, not tissues or cotton balls. Do not scrub or use excessive force. Pat dry using gauze sponges, not tissue or  cotton balls. Peri-Wound Care: AandD Ointment 3 x Per Week/30 Days Discharge Instructions: Apply AandD Ointment as directed Prim Dressing: Hydrofera Blue Ready Transfer Foam, 4x5 (in/in) (Dispense As Written) 3 x Per Week/30 Days ary Discharge Instructions: Apply Hydrofera Blue Ready to wound bed as directed Secondary Dressing: ABD Pad 5x9 (in/in) (Generic) 3 x Per Week/30 Days Discharge Instructions: Cover with ABD pad Secured With: ACE WRAP - 725M ACE Elastic Bandage With VELCRO Brand Closure, 4 (in) 3 x Per Week/30 Days Secured With: Medipore T - 725M Medipore H Soft Cloth Surgical T ape ape, 2x2 (in/yd) (Generic) 3 x Per Week/30 Days Secured With: [employer] Sterile or Non-Sterile 6-ply 4.5x4 (yd/yd) (Generic) 3 x Per Week/30 Days Discharge Instructions: Apply Kerlix as directed 1. I am going to recommend that we have the patient continue to monitor for any evidence of worsening or infection. Obviously if anything changes he knows in contact the office and let me know. 2. I am also can recommend that we have him continue specifically with the [facility] followed by the ABD pad and roll gauze to secure in place and Ace wrap to cover. 3. I would also suggest that he continue to stay off of this is much as possible utilizing knee scooter the more this and does the faster and better I do believe he will heal. We will see patient back for reevaluation in 1 week here in the clinic. If anything worsens or changes patient will contact our office for additional recommendations. Electronic Signature(s) Signed: 05/28/2022 1:34:13 PM By: Worthy Keeler PA-C Entered By: Worthy Keeler on 05/28/2022 13:34:13 [name] ([id number]) [id number] Page 14 of 14 -------------------------------------------------------------------------------- SuperBill Details Patient Name: Date of Service: Arie Sabina [name] [name]. 05/28/2022 Medical Record Number: [id number] Patient  Account Number: [id number] Date of Birth/Sex: Treating RN: September 05, 1960 (61 y.o. [name]) Carlene Coria Primary Care Provider: [name] Other Clinician: Massie Kluver Referring Provider: Treating Provider/Extender: [name] in Treatment: 17 Diagnosis Coding ICD-10 Codes Code Description [phone] Chronic multifocal osteomyelitis, left ankle and foot E11.621 Type 2 diabetes mellitus with foot ulcer L97.524 Non-pressure chronic ulcer of other part of left foot with necrosis of bone T81.31XA Disruption of external operation (surgical) wound, not elsewhere  classified, initial encounter Facility Procedures : CPT4 Code: [id number] 1 Description: 8264 - DEB SUBQ TISSUE 20 SQ CM/< ICD-10 Diagnosis Description L97.524 Non-pressure chronic ulcer of other part of left foot with necrosis of bone Modifier: Quantity: 1 : CPT4 Code: 15830940 1 Description: 7680 - DEB SUBQ TISS EA ADDL 20CM ICD-10 Diagnosis Description L97.524 Non-pressure chronic ulcer of other part of left foot with necrosis of bone Modifier: Quantity: 1 Physician Procedures : CPT4 Code Description Modifier [id number] 11042 - WC PHYS SUBQ TISS 20 SQ CM ICD-10 Diagnosis Description L97.524 Non-pressure chronic ulcer of other part of left foot with necrosis of bone Quantity: 1 : [id number] 29244 - WC PHYS SUBQ TISS EA ADDL 20 CM ICD-10 Diagnosis Description L97.524 Non-pressure chronic ulcer of other part of left foot with necrosis of bone Quantity: 1 Electronic Signature(s) Signed: 05/28/2022 1:34:28 PM By: Worthy Keeler PA-C Entered By: Worthy Keeler on 05/28/2022 13:34:28

## 2022-06-04 ENCOUNTER — Ambulatory Visit: Payer: 59 | Admitting: Physician Assistant

## 2022-06-05 ENCOUNTER — Ambulatory Visit: Payer: 59 | Admitting: Podiatry

## 2022-06-10 ENCOUNTER — Ambulatory Visit (INDEPENDENT_AMBULATORY_CARE_PROVIDER_SITE_OTHER): Payer: 59

## 2022-06-10 ENCOUNTER — Encounter: Payer: Self-pay | Admitting: Podiatry

## 2022-06-10 ENCOUNTER — Ambulatory Visit: Payer: 59 | Admitting: Podiatry

## 2022-06-10 VITALS — BP 142/77 | HR 105

## 2022-06-10 DIAGNOSIS — R141 Gas pain: Secondary | ICD-10-CM | POA: Insufficient documentation

## 2022-06-10 DIAGNOSIS — L97522 Non-pressure chronic ulcer of other part of left foot with fat layer exposed: Secondary | ICD-10-CM | POA: Diagnosis not present

## 2022-06-10 DIAGNOSIS — M21172 Varus deformity, not elsewhere classified, left ankle: Secondary | ICD-10-CM

## 2022-06-10 DIAGNOSIS — R1013 Epigastric pain: Secondary | ICD-10-CM | POA: Insufficient documentation

## 2022-06-10 DIAGNOSIS — E1142 Type 2 diabetes mellitus with diabetic polyneuropathy: Secondary | ICD-10-CM

## 2022-06-10 DIAGNOSIS — K439 Ventral hernia without obstruction or gangrene: Secondary | ICD-10-CM | POA: Insufficient documentation

## 2022-06-10 DIAGNOSIS — K5730 Diverticulosis of large intestine without perforation or abscess without bleeding: Secondary | ICD-10-CM | POA: Insufficient documentation

## 2022-06-10 DIAGNOSIS — Z794 Long term (current) use of insulin: Secondary | ICD-10-CM

## 2022-06-10 DIAGNOSIS — R194 Change in bowel habit: Secondary | ICD-10-CM | POA: Insufficient documentation

## 2022-06-10 DIAGNOSIS — K625 Hemorrhage of anus and rectum: Secondary | ICD-10-CM | POA: Insufficient documentation

## 2022-06-10 DIAGNOSIS — R1310 Dysphagia, unspecified: Secondary | ICD-10-CM | POA: Insufficient documentation

## 2022-06-10 MED ORDER — LEVOFLOXACIN 750 MG PO TABS: 750.0000 mg | ORAL_TABLET | Freq: Every day | ORAL | 0 refills | Status: DC

## 2022-06-11 ENCOUNTER — Encounter: Payer: 59 | Attending: Physician Assistant | Admitting: Physician Assistant

## 2022-06-11 DIAGNOSIS — M86372 Chronic multifocal osteomyelitis, left ankle and foot: Secondary | ICD-10-CM | POA: Diagnosis not present

## 2022-06-11 DIAGNOSIS — E11621 Type 2 diabetes mellitus with foot ulcer: Secondary | ICD-10-CM | POA: Diagnosis present

## 2022-06-11 DIAGNOSIS — Z794 Long term (current) use of insulin: Secondary | ICD-10-CM | POA: Insufficient documentation

## 2022-06-11 DIAGNOSIS — E1142 Type 2 diabetes mellitus with diabetic polyneuropathy: Secondary | ICD-10-CM | POA: Insufficient documentation

## 2022-06-11 DIAGNOSIS — L97524 Non-pressure chronic ulcer of other part of left foot with necrosis of bone: Secondary | ICD-10-CM | POA: Insufficient documentation

## 2022-06-11 NOTE — Progress Notes (Addendum)
JAHMAR, MCKELVY ([id number]) [id number] Page 1 of 13 Visit Report for 06/11/2022 Chief Complaint Document Details Patient Name: Date of Service: [name] Curry RO LD F. 06/11/2022 8:15 A M Medical Record Number: [id number] Patient Account Number: [id number] Date of Birth/Sex: Treating RN: 1960-08-23 (61 y.o. [name]) Carlene Coria Primary Care Provider: [name] Other Clinician: Massie Kluver Referring Provider: Treating Provider/Extender: [name] in Treatment: 19 Information Obtained from: Patient Chief Complaint Left lateral foot ulcer Electronic Signature(s) Signed: 06/11/2022 8:26:03 AM By: Worthy Keeler PA-C Entered By: Worthy Keeler on 06/11/2022 08:26:02 -------------------------------------------------------------------------------- Debridement Details Patient Name: Date of Service: [name] Curry RO LD F. 06/11/2022 8:15 A M Medical Record Number: [id number] Patient Account Number: [id number] Date of Birth/Sex: Treating RN: 05-27-1961 (61 y.o. [name]) Carlene Coria Primary Care Provider: [name] Other Clinician: Massie Kluver Referring Provider: Treating Provider/Extender: [name] in Treatment: 19 Debridement Performed for Assessment: Wound #14 Left,Lateral Foot Performed By: Physician Tommie Sams., PA-C Debridement Type: Debridement Severity of Tissue Pre Debridement: Fat layer exposed Level of Consciousness (Pre-procedure): Awake and Alert Pre-procedure Verification/Time Out Yes - 08:38 Taken: Start Time: 08:38 T Area Debrided (L x W): otal 5 (cm) x 3 (cm) = 15 (cm) Tissue and other material debrided: Viable, Non-Viable, Callus, Slough, Subcutaneous, Slough Level: Skin/Subcutaneous Tissue Debridement Description: Excisional Instrument: Curette Bleeding: Minimum Hemostasis Achieved: Pressure Response to Treatment: Procedure was tolerated well Level of Consciousness (Post- Awake and  Alert procedure): COLTIN, CASHER ([id number]) [id number] Page 2 of 13 Post Debridement Measurements of Total Wound Length: (cm) 4 Width: (cm) 2.5 Depth: (cm) 0.3 Volume: (cm) 2.356 Character of Wound/Ulcer Post Debridement: Stable Severity of Tissue Post Debridement: Fat layer exposed Post Procedure Diagnosis Same as Pre-procedure Electronic Signature(s) Signed: 06/11/2022 4:34:43 PM By: Carlene Coria RN Signed: 06/11/2022 5:45:58 PM By: Worthy Keeler PA-C Signed: 06/18/2022 4:45:28 PM By: Massie Kluver Entered By: Massie Kluver on 06/11/2022 08:44:39 -------------------------------------------------------------------------------- HPI Details Patient Name: Date of Service: [name] Curry RO LD F. 06/11/2022 8:15 A M Medical Record Number: [id number] Patient Account Number: [id number] Date of Birth/Sex: Treating RN: 1960/08/21 (61 y.o. [name] Primary Care Provider: [name] Other Clinician: Massie Kluver Referring Provider: Treating Provider/Extender: [name] in Treatment: 31 History of Present Illness HPI Description: Pleasant 61 year old with history of diabetes (Hgb A1c 10.8 in 2014) and peripheral neuropathy. No PVD. L ABI 1.1. Status post right great toe partial amputation years ago. He was at work and on 10/22/2014, was injured by a cart, and suffered an ulceration to his left anterior calf. He says that it subsequently became infected, and he was treated with a course of antibiotics. He was found on initial exam to have an ulceration on the dorsum of his left third toe. He was unaware of this and attributes it to pressure from his steel toed boots. More recently he injured his right anterior calf on a cart. Ambulating normally per his baseline. He has been undergoing regular debridements, applying mupirocin cream, and an Ace wrap for edema control. He returns to clinic for follow-up and is without  complaints. No pain. No fever or chills. No drainage. 10/25/15; this is a 61 year old man who has type II diabetes with diabetic polyneuropathy. He tells me that he fractured his left fifth metatarsal in June 2016 when he presented with swelling. He does not recall a specific injury. His hemoglobin A1c was apparently too high at the time for  consideration of surgery and he was put in some form of offloading. Ultimately he went to surgery in December with an allograft from his calcaneus to this site, plate and screws. He had an x-ray of the foot in March that showed concerns about nonunion. He tells me that in March he had to move and basically moved himself. He was on his foot a lot and then noticed some drainage from an open area. He has been following with his orthopedic surgeon Dr. Doran Durand. He has been applying a felt donut, dry dressing and using his heel healing sandal. 11/01/15; this is a patient I saw last week for the first time. He had a small open wound on the plantar aspect of his left foot at roughly the level of the base of his fifth metatarsal. He had a considerable degree of thickened skin around this wound on the plantar aspect which I thought was from chronic pressure on this area. He tells [place] that he had drainage over the course of the week. No systemic symptoms. 11/08/15; culture last week grew Citrobacter korseri. This should've been sensitive to the Augmentin I gave him. He has seen Dr. Doran Durand who did his initial surgery and according the patient the plan is to give this another month and then the hardware might need to come out of this. This seems like a reasonable plan. I will adjust his antibiotics to ciprofloxacin which probably should continue for at least another 2 weeks. I gave him 10 days worth today 11/22/15 the patient has completed antibiotics. He has an appointment with Dr. Doran Durand this Friday. There is improved dimensions around the wound on the left fifth metatarsal  base 11/29/15; the patient has completed antibiotics last week. Apparently his appointment with Dr. Doran Durand it is not until this Friday. Dimensions are roughly the same. 12/06/15; saw Dr. Doran Durand. No x-ray told the end of the month, next appointment June 30. We have been using Aquacel Ag 12/13/15: No major change this week. Using Aquacel AG 621/17; arrived this week with maceration around the wound. There was quite a bit of undermining which required surgical debridement. I changed him to [facility] last week, by the patient's admission he was up on this more this week 12/27/15; macerated tissue around the wound is removed with a scalpel and pickups. There is no undermining. Nonviable subcutaneous tissue and skin taken from the superior circumference of the wound is slough from the surface. READMISSION 03/06/16 since I last saw this patient at the end of June, he went for surgery on 01/11/16 by Dr. Doran Durand of orthopedics. He had a left foot irrigation and debridement, removal of hardware and placement of wound VAC. He is also been followed by Dr. Megan Salon of infectious disease and completed a six-week course of IV Rocephin for group B strep and Enterobacter in the bone at the time of surgery. Apparently at the time of surgery the bone looked healthy so I don't think any bone was actually removed. He has been using silver alginate based dressings on the same wound area at the base of the left fifth metatarsal [name], [name] ([id number]) [id number] Page 3 of 13 on the left. I note that he is also had arterial studies on 01/08/16, these showed a left ABI of 1.2 to and a right ABI of 1.3. Waveforms were listed as biphasic. He was not felt to have any specific arterial issues. 03/13/16; no real change in the condition of the wound at the left lateral foot at  roughly the base of his fifth metatarsal. Use silver alginate last week. 03/18/16 arrives today with no open area. Being  suspicious of the overlying callus I. Some of this back although I see nothing but covering tissue here/epithelium. There is no surrounding tenderness READMISSION 04/16/16 this is a patient I discharged about a month ago. Initially a surgical wound on the lateral aspect of his left foot which subsequently became infected. The story he is giving today that he went back into his own modified shoe started to notice pain 2 weeks ago he was seen in Dr. Nona Dell office by a physician assistant last Wednesday and according the patient was told that everything looked fine however this is clearly now broken open and he has an open wound in the same spot that we have been dealing with repetitively. Situation is complicated by the fact that he is running short of money on long-term disability. He has not taken his insulin and at least 2 weeks was previously on NovoLog short acting insulin on a sliding scale and TRESIBA 35 units at bedtime. He is no longer able to afford any of his medication he was in the x-ray on 10/15. A plain x-ray showed healed fracture of the left fifth metatarsal bone status post removal of the associated plate and screw fixation hardware. There was no acute appearing osseous abnormality. His blood work showed a white count of 9.2 with an essentially normal differential comprehensive metabolic panel was normal. Previous CT scan of the foot on 01/08/16 showed no osteomyelitis previous vascular workup showed no evidence of significant PAD on 01/13/16 04/23/16; culture I did last week grew enterococcus [ampicillin sensitive] and MRSA. He saw infectious disease yesterday. They stop the clindamycin and ordered an MRI. This is not unreasonable. All the hardware is out of the foot at this point. 04/30/16 at this point in time we are still awaiting the results of the MRI at this point in time. Patient did have an area which appears to be somewhat macerated in the proximal portion of the wound where  there is overlying necrotic skin that is doing nothing more than trapping fluid underneath. He continues to state that he does have some discomfort and again the concern is for the possibility of osteomyelitis hence the reason for the MRI order. We have been using a silver alginate dressing but again I think the reason this with his macerated as it was is that the dressing obscene could not reach the entirety of the wound due to some necrotic skin covering the proximal portion. No bleeding noted at this point in time on initial evaluation. 05/07/16 we receive the results of patient's MRI which shows that he has no evidence of osteomyelitis. This obviously is excellent news. Patient was definitely happy to hear this. He tells me the wound appears to be doing very well at this point in time and is pleased with the progress currently. 05/15/16; as noted the patient did not have osteomyelitis. He has been released by infectious disease and orthopedics. His wound is still open he had a debridement last week but complain when he got home he "bleeding for half a day". He is not had any pain. We have been using silver alginate with Kerlix gauze wrap. 05/28/16; patient arrives today with the wound in much the same condition as last time. He has a small opening on the lateral aspect of his foot however with debridement there is clear undermining medially there is no real evidence of infection  here and I didn't see any point in culturing this. One would have to wonder if this isn't a simple matter of in adequate offloading as he is been using a healing sandal. 06/05/16; the open area is now on the plantar aspect of his foot and not decide. The wound almost appears to have "migrated". This was the term use by our intake nurse. 06/12/16; open area on the plantar aspect of his foot. Base of the wound looks very healthy. This will be his second week in a total contact cast 06/19/16; patient arrived today in a total  contact cast. There was some expectation from our staff and myself that this area would be healed. Unfortunately the area was boggy and with rec pressure a fairly substantial amount of purulent drainage was obtained. Specimen obtained for culture. The patient had no complaints of systemic problems including fever or chills or instability of his diabetes. There was no pain in the foot. Nevertheless a extensive debridement was required. 06/26/16; patient's culture from the abscess last week grew a combination of MRSA and ampicillin sensitive enterococcus. I had him on Augmentin and Septra however I have elected to give him a full 10 day course of Zyvox instead as I Recently treated this combination of organisms with Augmentin and Septra before. Arrives today with no systemic symptoms 07/03/16; the patient has 2 more treatments of Zyvox and then he is finished antibiotics the wound has improved now mostly on the lateral aspect of his foot. There is still some tenderness when he walks. 07/10/16; patient arrives today with Zyvox completed. He only has a small open area remaining. 07/24/16; she arrives here with no overt open area. The covering is thick callus/eschar. Nevertheless there is no open area here. He has some tenderness underneath the area but no overt infection is observed no drainage. The patient has a deformity in the foot with this area weekly to be exposed to more pressure in his foot where nevertheless that something we are going to have to deal with going forward. The patient has diabetic workboots and diabetic shoes. He has had a long difficult course with the area here. This started as a fracture at work. He had bone grafting from his calcaneus and screws. This got infected he had to have more surgery on the area. Bone at the time of that surgery I think showed enterococcus and group B strep. He had 6 weeks of Rocephin. Since then the area has waxed and waned in its difficulty. Recently he  had an MRI in December that did not show osteomyelitis nevertheless he had an abscess that grew MRSA and enterococcus which I elected to treat with Zyvox. This was in December and this wound is actually "healed" over 08/21/16; the patient came in today for his one-month follow-up visit. The area on the lateral aspect of his left foot looks much the same as some month ago. There is no evidence of an open wound here. However the patient tells me about a week after he went back to work he developed severe pain and swelling in the plantar aspect of his right foot first and fifth metatarsal heads. 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 He went to see his podiatrist at [facility]. She told him that he could not work on his feet. She told him to go back and his cam walking boot on the left. Not have  open wounds obviously on the right. The patient is actually gone ahead and retired from his job because he does not feel he can work on his feet 09/18/16; patient comes back in saying he recently had some pain on the lateral aspect of his left foot. Asked that we look at this. Other than that all of his wounds have a viable surface. He has diabetic shoes. He is retired from work. READMISSION [name] is a 61 year old man who we cared for for a prolonged period of time in late 2017 into March 2018. At that point he had suffered a fracture of his left fifth metatarsal at about the level of the base of the fifth metatarsal. He had surgical repair by Dr. Meryl Dare and he developed a very refractory wound over the fifth metatarsal. Ultimately this became infected he required hardware removal this eventually closed over and we discharged from the clinic in March of this year. He tells me as well until roughly 3 weeks ago when he developed increasing pain in the same area making it difficult for him to sleep. He was admitted to  hospital from 9/5 through 9/7 with now an ulcer in the same area on the lateral aspect of his left foot at the level of the base of the metatarsal. X- rays and MRIs during this hospitalization were negative. Wound culture showed a combination of Escherichia coli, Morganella and Pseudomonas. He was given IV Cipro and vancomycin the hospital and then discharged home on Cipro and Doxy. He returned to the ER on 9/16 with leg swelling and discomfort. I don't think anything was added at that point although he did have an ultrasound of the left leg that was negative for DVT He was back in the ER on 10/4 again with . pain in the area he was given Bactrim but states he had a significant allergic reaction to that which has abated once he stopped the Bactrim. I don't think he had a full course. He went to see his podiatrist yesterday who recommended some form of foot soak. He has a Darco forefoot offloading boot The patient is a type II diabetic on insulin. T me his recent hemoglobin A1c was 7. Arterial studies done in July 2017 showed an ABI in the right of 1.3 on the ells left 1.2 to biphasic waveforms bilaterally. He was not felt to have arterial disease. As noted the patient has had 2 MRIs most recently on 10/4 this showed no evidence of an abscess or osteomyelitis and no change since the prior study of 03/05/17 there was nonspecific edema on the dorsum of the foot. ABIs in this clinic today 1.2 which would be unchanged from his study done in July/17 04/15/17; now down to 1 small wound on the left lateral foot. We wrapped him and applied collagen last week and the foot is fairly macerated. 04/22/17; small wound on the left lateral foot however it has some undermining. Using collagen 04/29/17; small wound on the left lateral foot some surrounding callus. Chronic damage in this area as a result of initial fracture nonhealing and secondary infection. 05/06/17; the patient arrives today with the area on the left  lateral foot closed. There is thick subcutaneous tissue with a layer of callus. I took some of the callus off just to ensure adequate closure of the underlying tissue and there is no open wound here. He has considerable deformity of this area of his foot however and unless there is an ability to offload this  I think opening is going to be recurrent. He tells me he has diabetic foot wear although I have not actually seen this [name], [name] ([id number]) [id number] Page 4 of 13 Readmission: 09/02/17 on evaluation today patient appears to be doing a little better compared to what he tells me what's going on in regard to his left lateral foot. He was previously seen in the ER this past Saturday it is now Tuesday. On Saturday he actually was treated with antibiotics including Cipro 500 mg two times a day and doxycycline 100 mg two times a day. Subsequently he also did have an x-ray performed of his left foot which revealed increased degenerative changes of the base of the fifth metatarsal since comparison in 2018. There was no radiographic evidence of osteomyelitis however. Patient has previously had an MRI of the left foot which was performed on 04/03/17 in this revealed at that point a soft tissue ulceration but no evidence of osteomyelitis. Patient has previously undergone testing in regard to his ABI's by Dr. Bridgett Larsson and this showed that he did have ABI was within normal limits which does correspond with ABI's we checked here in the office today. That study was on 01/13/16. With all that being said on evaluation today there does not appear to be any evidence of a true open wound of the left lateral fifth metatarsal region. He does have tenderness noted as well is a little bit of boggy/fluctuance feeling to the area in this region of his foot and there is definitely a very solid and firm callous noted laterally. With that being said I am not able to find any obvious opening at this  point there does appear to be a spot where there appears to been a opening very recently however patient states he has not noted any drainage from this currently. He does however have discomfort with palpation of this area this is in the 3-4/10 range only with very firm palpation this is not too significant at all. Subsequently he does have a small ulcer at the medial nail bed of the right second toe where he inadvertently pulled off a portion of the toenail softly causing this injury. Otherwise there does not appear to be any evidence of infection in regard to this right second toe. 09/23/17 on evaluation today patient actually appears to be doing well he has no open ulcerations noted at this point. With that being said he did have significant callous noted over the left lateral foot which did require callous pairing today he tolerated this without complication. Readmission: 11/07/17 patient presents today for readmission concerning to ulcers that he has. One on the right plantar fifth metatarsal region and the other on the left plantar fifth metatarsal region. He has previously had an x-ray which was performed on 11/05/17 and revealed that the patient had wounds noted of the base of the fifth metatarsal region without evidence of destruction to the bone to suggest osteomyelitis. Obviously this is excellent news. With that being said he does have a significantly large ulcer that extensively plantar surface of the wound and is concerning for the fact that he continues to walk on this due to the patient telling me that he "has to work" I do believe this has likely led to both the opening of the wound as well as the fact that is not really able to heal very well at this point. He has been seen by his podiatrist and apparently according to the note dated 10/23/17 the  patient was provided with Santyl and recommended a dry dressing. No wound culture has been obtained at this point. As stated above he has had  returned to work due to financial reasons and is not able to wear his postop shoes due to the fact that he cannot wear this and work. The wound is stated to have been present for "several weeks" prior to the visit with the podiatrist on 10/23/17. With that being said again at least the good news is there does not appear to be any evidence of osteomyelitis. 11/14/17 on evaluation today patient appears to be doing poorly in regard to his bilateral foot ulcers. Unfortunately he seems to have significant callous with a lot of maceration underlined especially on the right even more so than the left. He has been tolerating the dressing changes without complication. With that being said overall I do think he's gonna have to have a significant debridement today. 11/21/17 on evaluation today patient presents for follow-up concerning his bilateral lateral foot ulcers. He has been tolerating the dressing changes without complication. His wife helps perform these for him. With that being said the patient states that the left lateral foot ulcer actually is continuing to give him trouble as far as discomfort is concerned. With that being said actually appears better. 11/28/17 on evaluation today patient appears to be doing poorly in regard to his left lower extremity ulcer in particular. His right as made some progress on the lateral portion although the plantar portion is actually getting somewhat worse I think this is likely due to the fact that he continues to work although it hurts and he's been having a lot of issues out of this side and as far as pain is concerned. With that being said my biggest concern on evaluation today is that of the patient having issues with increased pain in the left lower extremity ulcer site especially on the plantar aspect. He also appears to have what seems to be an abscess developing in the dorsal/lateral portion of his foot which I think likely is communicating somewhere internally  with the ulcer site. Nonetheless he has not had any fevers, chills, nausea, or vomiting noted at this time. He has however had fairly significant increase in pain and he was Artie having a lot of pain previous. Readmission: 01-29-2022 upon evaluation today patient presents for initial inspection here in our clinic concerning the current issue which has been present since around Oct 29, 2021. The patient tells me that he has at this point been seen by Dr. Vickki Muff who has been managing this up to this point. He has also been seen by Dr. Lazaro Arms I will outline the findings there shortly as well. With that being said currently according to Dr. Alvera Singh note on 12-18-2021 the patient has a prescription for Zosyn that was given by infectious disease. Again that was back in June however. There was a hospital encounter for incision and drainage and debridement of the foot in order to clearway necrotic tissue. The patient also underwent vascular evaluation with Dr. Lucky Cowboy. Nonetheless on the postop visit on 12-18-2021 the patient was seeming to be doing quite well based on what Dr. Vickki Muff said but nonetheless since that time is really not made much progress and is concerned about losing his foot. He therefore requested a referral to wound care to be seen and evaluated and see if there is anything we can do to help and aid with getting this to heal. Fortunately there does not  appear to be any evidence of active infection locally or systemically at this time which is great news. He tells me that he is currently using Betadine wet-to-dry dressings. He does not note any systemic infections but does have evidence of local infection based on what we are seeing. He has had a recent chest x-ray which showed that he had no evidence of acute cardiopulmonary disease that was performed on 12-12-2021. He also underwent vascular intervention with Dr. 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 Subsequently the patient states that Dr. Lucky Cowboy has mentioned that he would need to go back at some time with Dr. Lucky Cowboy said he would consider a posterior tibial approach for revascularization in the future but at this time because of kidney status and the amount of dye used he did not [name] to proceed any further at that point. Again that something that would likely be scheduled down the road. Nonetheless right now the patient does have an ABI on the left of 0.90 with a TBI of 0.6-0 but this was decreased compared to previous. His ABI on the right was 1.08. Patient did have as well MRI of the left foot which was performed on 12-12-2021 and this shows that he does have interval resection of the proximal third of the fifth metatarsal and area of osteomyelitis on prior MRI. There is persistent ulceration of plantar lateral aspect at the resection site with a sinus tract extending towards the adjacent cuboid bone findings suspicious for osteomyelitis of the plantar lateral aspect of the cuboid and adjacent base of the fourth metatarsal possibly in the proximal most aspect of the remaining fifth metatarsal shaft as well. The ABI performed on 12-13-2021 showed that the left ABI was 0.83 which had previously been registered at 1.35 and the right ABI was 1.20. I think this is consistent with being able to heal but obviously would do better with vascular intervention not something that is going to be looked into in the future. This was noted above as well. Based on what I am seeing today I do believe that the patient is definitely a candidate for HBO therapy I think this may be in his best interest as I think he is at the point of being in a limb threatening situation. I do believe that he would benefit from hyperbarics along with continued antibiotics along with good wound care measures as well. 02-04-2022 upon  evaluation today patient appears to be doing still poorly in regard to his foot he is having a lot of drainage and he notes odor as well. With that being said we still have not heard anything back from insurance yet as far as approval for HBO therapy. Subsequently the patient does have evidence of osteomyelitis and I think hyperbarics is definitely going to be something that would be beneficial for him. He has been on antibiotics several times previous but unfortunately this just does not seem to be sustained enough we did do a culture it showed Pseudomonas as one of the primary organisms both organisms would be treated with Levaquin which I think is going to be the best way to go. I discussed that with the patient today and I Georgina Peer send that into the pharmacy. 02-11-2022 upon evaluation today patient appears to be doing well currently in regard to his  wound. I do feel like the Levaquin is doing better for him which is great news. Fortunately I do not see any evidence of active infection locally or systemically at this time which is great news. No fevers, chills, nausea, vomiting, or diarrhea. 02-19-2022 upon evaluation today patient appears to be doing excellent in regard to his foot ulcer all things considered he still has bone exposed but the good news is we are at least making some progress here I think he does have a lot of odor he tells me and I think switching over to Dakin's moistened gauze dressing is probably can be the way to go. 02-26-2022 upon evaluation today patient appears to be doing well currently in regard to his wound all things considered. Although the 1 thing I do see is that he is not changing the dressing as frequently as he should be. Fortunately there does not appear to be any signs of active infection he tells me his wife is out of town from Saturday through today so it has not been changed since Saturday before that they have been doing this every other day. I explained that  with the ABB, [name] ([id number]) [id number] Page 5 of 46 Dakin's he needs to do this every day in order to make sure that it heals appropriately. This will also help with the odor which she has been complaining of. 03-11-2022 upon evaluation today patient appears to be doing better overall visually in regard to his wound. He is tolerating the dressing changes without complication. Fortunately there does not appear to be any evidence of active infection locally or systemically at this time. No fevers, chills, nausea, vomiting, or diarrhea. 03-19-2022 upon evaluation today patient appears to be doing decently well in regard to his wound. He is actually tolerating dressing changes without Complication. Fortunately there does not appear to be any signs of active infection locally or systemically at this time which is great news. No fevers, chills, nausea, vomiting, or diarrhea. 04-02-2022 upon evaluation today patient's wound is actually showing signs of significant improvement I am actually very pleased with where we stand today. I do not see any evidence of infection locally or systemically at this time which is great news. 04-08-2022 upon evaluation today patient actually appears to be making good progress. I am very pleased with where things stand his wound is smaller he seems to be tolerating hyperbarics well he is doing well with wound care and in general I think we are making excellent headway here. I do believe that he would likely benefit from extending the hyperbarics. I think that he is making such good progress I do not [name] to see anything backtrack on him at this point. Overall the benefit coupled with the antibiotics and an excellent wound care that we have been working with him with and that his wife is continuing to do at home I think he is making a great impact in getting this healed to prevent limb loss. 04-16-2022 upon evaluation today patient appears to  be doing excellent with the [facility] dressing. Coupled with hyperbarics he is managing quite nicely and healing quite nicely. Fortunately I do not see any evidence of active infection locally or systemically which is great news and overall I am extremely pleased with where we stand today. No fevers, chills, nausea, vomiting, or diarrhea. 04-23-2022 upon evaluation today patient appears to be doing well currently in regard to his wound for the most part although there is some area of  rubbing on the lateral aspect as well as the top of his foot. When questioned he actually has been more active over the weekend he tells me that it was his birthday and he had some friends over. Subsequently I think this is what led to things being somewhat worse compared to what they were previous. Fortunately I do not see any evidence of active infection locally or systemically at this time which is great news. No fevers, chills, nausea, vomiting, or diarrhea. We are still waiting on the approval for continuation of HBO therapy. 04-30-2022 upon evaluation today patient's wound is actually showing signs of improvement. He continues to utilize the knee scooter which I think is doing well for him. The volume continues to improve the size got a little worse last week due to the fact that he was up on this more. The good news is he is actually back down to more normal situation at this point today. There is some need for sharp debridement. 11/7 patient was seen in conjunction with HBO which she is tolerating well. HBO dive #47 today. Wound is on the left plantar foot on the lateral aspect. He has been making excellent progress. 05-14-2022 upon evaluation today patient appears to be doing well currently in regard to his wound. Has been tolerating the dressing changes without complication and overall I am extremely pleased with where we stand currently. There is no evidence of active infection locally nor systemically at  this time. 05-21-2022 upon evaluation today patient appears to be doing well currently in regard to his wound. He has been tolerating the dressing changes without complication. He does have some swelling and weeping on the lateral portion of his foot. With that being said I do believe that he needs to do something to try to get the swelling under control. I do think an Ace wrap may be best to go back to at this point as well. Fortunately I think other than this the wound is making excellent progress. 05-28-2022 upon evaluation today patient appears to be doing well currently for the most part in regard to his foot ulcer though he does have a lot of debridement that needs to be undertaken today he has a lot of callus buildup around the edges of the wound which I think is affecting his healing as well. Fortunately I do not see any signs of active infection locally nor systemically at this time which is great news. No fevers, chills, nausea, vomiting, or diarrhea. 06-11-2022 upon evaluation today patient's wound is showing some signs of callus buildup he did have to be on this a bit more over the last week due to the fact that his brother-in-law unfortunately passed. He tells me that he was not able to use his knee scooter as much which is unfortunate but nonetheless he does not seem to be showing any signs of infection at this point which is great news. No fevers, chills, nausea, vomiting, or diarrhea. Electronic Signature(s) Signed: 06/11/2022 9:18:31 AM By: Worthy Keeler PA-C Entered By: Worthy Keeler on 06/11/2022 09:18:31 -------------------------------------------------------------------------------- Physical Exam Details Patient Name: Date of Service: [name] Curry RO LD F. 06/11/2022 8:15 A M Medical Record Number: [id number] Patient Account Number: [id number] Date of Birth/Sex: Treating RN: Feb 14, 1961 (61 y.o. [name] Primary Care Provider: [name] Other Clinician:  Massie Kluver Referring Provider: Treating Provider/Extender: [name] in Treatment: 107 Constitutional Well-nourished and well-hydrated in no acute distress. Respiratory PHAROAH, GOGGINS ([id number])  [id number] Page 6 of 13 normal breathing without difficulty. Psychiatric this patient is able to make decisions and demonstrates good insight into disease process. Alert and Oriented x 3. pleasant and cooperative. Notes Upon inspection patient's wound bed actually showed signs of good granulation epithelization at this point. Fortunately I do not see any evidence of active infection locally nor systemically which is great news and overall I am extremely pleased with where we stand. I did perform a fairly aggressive sharp debridement clearway some of the necrotic debris tolerated that today without complication postdebridement the wound bed is significantly improved. Electronic Signature(s) Signed: 06/11/2022 9:18:59 AM By: Worthy Keeler PA-C Entered By: Worthy Keeler on 06/11/2022 09:18:59 -------------------------------------------------------------------------------- Physician Orders Details Patient Name: Date of Service: [name] Curry RO LD F. 06/11/2022 8:15 A M Medical Record Number: [id number] Patient Account Number: [id number] Date of Birth/Sex: Treating RN: 1960-08-16 (61 y.o. [name]) Carlene Coria Primary Care Provider: [name] Other Clinician: Massie Kluver Referring Provider: Treating Provider/Extender: [name] in Treatment: 71 Verbal / Phone Orders: No Diagnosis Coding ICD-10 Coding Code Description [phone] Chronic multifocal osteomyelitis, left ankle and foot E11.621 Type 2 diabetes mellitus with foot ulcer L97.524 Non-pressure chronic ulcer of other part of left foot with necrosis of bone T81.31XA Disruption of external operation (surgical) wound, not elsewhere classified, initial  encounter Follow-up Appointments Return Appointment in 1 week. Bathing/ [employer] wounds with antibacterial soap and water. May shower; gently cleanse wound with antibacterial soap, rinse and pat dry prior to dressing wounds Anesthetic (Use 'Patient Medications' Section for Anesthetic Order Entry) Lidocaine applied to wound bed Edema Control - Lymphedema / Segmental Compressive Device / Other Elevate, Exercise Daily and A void Standing for Long Periods of Time. Elevate legs to the level of the heart and pump ankles as often as possible Elevate leg(s) parallel to the floor when sitting. Off-Loading Foot Defender - left foot Other: - knee scooter Additional Orders / Instructions Vitamin A Vitamin C, Zinc - Use AandD oinment to leg/foot do not use in wounds ; Other: - need daily dressing changes Wound Treatment Wound #14 - Foot Wound Laterality: Left, Lateral Cleanser: [place]ram Ancillary Kit - 15 Day Supply (DME) (Generic) 3 x Per Week/30 Days Discharge Instructions: Use supplies as instructed; Kit contains: (15) Saline Bullets; (15) 3x3 Gauze; 15 pr Gloves Cleanser: Wound Cleanser 3 x Per Week/30 Days JIONNI, HELMING ([id number]) [id number] Page 7 of 13 Discharge Instructions: Wash your hands with soap and water. Remove old dressing, discard into plastic bag and place into trash. Cleanse the wound with Wound Cleanser prior to applying a clean dressing using gauze sponges, not tissues or cotton balls. Do not scrub or use excessive force. Pat dry using gauze sponges, not tissue or cotton balls. Peri-Wound Care: AandD Ointment 3 x Per Week/30 Days Discharge Instructions: Apply AandD Ointment as directed Prim Dressing: Hydrofera Blue Ready Transfer Foam, 4x5 (in/in) (Dispense As Written) 3 x Per Week/30 Days ary Discharge Instructions: Apply Hydrofera Blue Ready to wound bed as directed Secondary Dressing: ABD Pad 5x9 (in/in) (DME) (Generic) 3 x Per  Week/30 Days Discharge Instructions: Cover with ABD pad Secondary Dressing: Gauze (DME) (Generic) 3 x Per Week/30 Days Discharge Instructions: As directed: dry, moistened with saline or moistened with Dakins Solution Secured With: ACE WRAP - 40M ACE Elastic Bandage With VELCRO Brand Closure, 4 (in) 3 x Per Week/30 Days Secured With: Medipore T - 40M Medipore H Soft Cloth Surgical T ape ape,  2x2 (in/yd) (Generic) 3 x Per Week/30 Days Secured With: Kerlix Roll Sterile or Non-Sterile 6-ply 4.5x4 (yd/yd) (Generic) 3 x Per Week/30 Days Discharge Instructions: Apply Kerlix as directed Electronic Signature(s) Signed: 06/11/2022 5:45:58 PM By: Worthy Keeler PA-C Signed: 06/18/2022 4:45:28 PM By: Massie Kluver Entered By: Massie Kluver on 06/11/2022 08:54:16 -------------------------------------------------------------------------------- Problem List Details Patient Name: Date of Service: [name] Curry RO LD F. 06/11/2022 8:15 A M Medical Record Number: [id number] Patient Account Number: [id number] Date of Birth/Sex: Treating RN: 11-Apr-1961 (61 y.o. [name] Primary Care Provider: [name] Other Clinician: Massie Kluver Referring Provider: Treating Provider/Extender: [name] in Treatment: 19 Active Problems ICD-10 Encounter Code Description Active Date MDM Diagnosis M86.372 Chronic multifocal osteomyelitis, left ankle and foot 01/29/2022 No Yes E11.621 Type 2 diabetes mellitus with foot ulcer 01/29/2022 No Yes L97.524 Non-pressure chronic ulcer of other part of left foot with necrosis of bone 01/29/2022 No Yes T81.31XA Disruption of external operation (surgical) wound, not elsewhere classified, 01/29/2022 No Yes initial encounter [name], [name] ([id number]) [id number] Page 8 of 13 Inactive Problems Resolved Problems Electronic Signature(s) Signed: 06/11/2022 8:25:49 AM By: Worthy Keeler PA-C Entered By: Worthy Keeler on  06/11/2022 08:25:49 -------------------------------------------------------------------------------- Progress Note Details Patient Name: Date of Service: [name] Curry RO LD F. 06/11/2022 8:15 A M Medical Record Number: [id number] Patient Account Number: [id number] Date of Birth/Sex: Treating RN: 12-14-1960 (61 y.o. [name] Primary Care Provider: [name] Other Clinician: Massie Kluver Referring Provider: Treating Provider/Extender: [name] in Treatment: 19 Subjective Chief Complaint Information obtained from Patient Left lateral foot ulcer History of Present Illness (HPI) Pleasant 61 year old with history of diabetes (Hgb A1c 10.8 in 2014) and peripheral neuropathy. No PVD. L ABI 1.1. Status post right great toe partial amputation years ago. He was at work and on 10/22/2014, was injured by a cart, and suffered an ulceration to his left anterior calf. He says that it subsequently became infected, and he was treated with a course of antibiotics. He was found on initial exam to have an ulceration on the dorsum of his left third toe. He was unaware of this and attributes it to pressure from his steel toed boots. More recently he injured his right anterior calf on a cart. Ambulating normally per his baseline. He has been undergoing regular debridements, applying mupirocin cream, and an Ace wrap for edema control. He returns to clinic for follow-up and is without complaints. No pain. No fever or chills. No drainage. 10/25/15; this is a 61 year old man who has type II diabetes with diabetic polyneuropathy. He tells me that he fractured his left fifth metatarsal in June 2016 when he presented with swelling. He does not recall a specific injury. His hemoglobin A1c was apparently too high at the time for consideration of surgery and he was put in some form of offloading. Ultimately he went to surgery in December with an allograft from his calcaneus to this  site, plate and screws. He had an x-ray of the foot in March that showed concerns about nonunion. He tells me that in March he had to move and basically moved himself. He was on his foot a lot and then noticed some drainage from an open area. He has been following with his orthopedic surgeon Dr. Doran Durand. He has been applying a felt donut, dry dressing and using his heel healing sandal. 11/01/15; this is a patient I saw last week for the first time. He had  a small open wound on the plantar aspect of his left foot at roughly the level of the base of his fifth metatarsal. He had a considerable degree of thickened skin around this wound on the plantar aspect which I thought was from chronic pressure on this area. He tells [place] that he had drainage over the course of the week. No systemic symptoms. 11/08/15; culture last week grew Citrobacter korseri. This should've been sensitive to the Augmentin I gave him. He has seen Dr. Doran Durand who did his initial surgery and according the patient the plan is to give this another month and then the hardware might need to come out of this. This seems like a reasonable plan. I will adjust his antibiotics to ciprofloxacin which probably should continue for at least another 2 weeks. I gave him 10 days worth today 11/22/15 the patient has completed antibiotics. He has an appointment with Dr. Doran Durand this Friday. There is improved dimensions around the wound on the left fifth metatarsal base 11/29/15; the patient has completed antibiotics last week. Apparently his appointment with Dr. Doran Durand it is not until this Friday. Dimensions are roughly the same. 12/06/15; saw Dr. Doran Durand. No x-ray told the end of the month, next appointment June 30. We have been using Aquacel Ag 12/13/15: No major change this week. Using Aquacel AG 621/17; arrived this week with maceration around the wound. There was quite a bit of undermining which required surgical debridement. I changed him to [facility]  last week, by the patient's admission he was up on this more this week 12/27/15; macerated tissue around the wound is removed with a scalpel and pickups. There is no undermining. Nonviable subcutaneous tissue and skin taken from the superior circumference of the wound is slough from the surface. READMISSION 03/06/16 since I last saw this patient at the end of June, he went for surgery on 01/11/16 by Dr. Doran Durand of orthopedics. He had a left foot irrigation and debridement, removal of hardware and placement of wound VAC. He is also been followed by Dr. Megan Salon of infectious disease and completed a six-week course of IV Rocephin for group B strep and Enterobacter in the bone at the time of surgery. Apparently at the time of surgery the bone looked healthy so I don't think any bone was actually removed. He has been using silver alginate based dressings on the same wound area at the base of the left fifth metatarsal on the left. I note that he is also had arterial studies on 01/08/16, these showed a left ABI of 1.2 to and a right ABI of 1.3. Waveforms were listed as biphasic. He was not felt to have any specific arterial issues. 03/13/16; no real change in the condition of the wound at the left lateral foot at roughly the base of his fifth metatarsal. Use silver alginate last week. 03/18/16 arrives today with no open area. Being suspicious of the overlying callus I. Some of this back although I see nothing but covering tissue here/epithelium. There is no surrounding tenderness [name], [name] ([id number]) [id number] Page 9 of 13 READMISSION 04/16/16 this is a patient I discharged about a month ago. Initially a surgical wound on the lateral aspect of his left foot which subsequently became infected. The story he is giving today that he went back into his own modified shoe started to notice pain 2 weeks ago he was seen in Dr. Nona Dell office by a physician assistant last Wednesday and  according the patient was told that everything  looked fine however this is clearly now broken open and he has an open wound in the same spot that we have been dealing with repetitively. Situation is complicated by the fact that he is running short of money on long-term disability. He has not taken his insulin and at least 2 weeks was previously on NovoLog short acting insulin on a sliding scale and TRESIBA 35 units at bedtime. He is no longer able to afford any of his medication he was in the x-ray on 10/15. A plain x-ray showed healed fracture of the left fifth metatarsal bone status post removal of the associated plate and screw fixation hardware. There was no acute appearing osseous abnormality. His blood work showed a white count of 9.2 with an essentially normal differential comprehensive metabolic panel was normal. Previous CT scan of the foot on 01/08/16 showed no osteomyelitis previous vascular workup showed no evidence of significant PAD on 01/13/16 04/23/16; culture I did last week grew enterococcus [ampicillin sensitive] and MRSA. He saw infectious disease yesterday. They stop the clindamycin and ordered an MRI. This is not unreasonable. All the hardware is out of the foot at this point. 04/30/16 at this point in time we are still awaiting the results of the MRI at this point in time. Patient did have an area which appears to be somewhat macerated in the proximal portion of the wound where there is overlying necrotic skin that is doing nothing more than trapping fluid underneath. He continues to state that he does have some discomfort and again the concern is for the possibility of osteomyelitis hence the reason for the MRI order. We have been using a silver alginate dressing but again I think the reason this with his macerated as it was is that the dressing obscene could not reach the entirety of the wound due to some necrotic skin covering the proximal portion. No bleeding noted at this  point in time on initial evaluation. 05/07/16 we receive the results of patient's MRI which shows that he has no evidence of osteomyelitis. This obviously is excellent news. Patient was definitely happy to hear this. He tells me the wound appears to be doing very well at this point in time and is pleased with the progress currently. 05/15/16; as noted the patient did not have osteomyelitis. He has been released by infectious disease and orthopedics. His wound is still open he had a debridement last week but complain when he got home he "bleeding for half a day". He is not had any pain. We have been using silver alginate with Kerlix gauze wrap. 05/28/16; patient arrives today with the wound in much the same condition as last time. He has a small opening on the lateral aspect of his foot however with debridement there is clear undermining medially there is no real evidence of infection here and I didn't see any point in culturing this. One would have to wonder if this isn't a simple matter of in adequate offloading as he is been using a healing sandal. 06/05/16; the open area is now on the plantar aspect of his foot and not decide. The wound almost appears to have "migrated". This was the term use by our intake nurse. 06/12/16; open area on the plantar aspect of his foot. Base of the wound looks very healthy. This will be his second week in a total contact cast 06/19/16; patient arrived today in a total contact cast. There was some expectation from our staff and myself that this area would  be healed. Unfortunately the area was boggy and with rec pressure a fairly substantial amount of purulent drainage was obtained. Specimen obtained for culture. The patient had no complaints of systemic problems including fever or chills or instability of his diabetes. There was no pain in the foot. Nevertheless a extensive debridement was required. 06/26/16; patient's culture from the abscess last week grew a  combination of MRSA and ampicillin sensitive enterococcus. I had him on Augmentin and Septra however I have elected to give him a full 10 day course of Zyvox instead as I Recently treated this combination of organisms with Augmentin and Septra before. Arrives today with no systemic symptoms 07/03/16; the patient has 2 more treatments of Zyvox and then he is finished antibiotics the wound has improved now mostly on the lateral aspect of his foot. There is still some tenderness when he walks. 07/10/16; patient arrives today with Zyvox completed. He only has a small open area remaining. 07/24/16; she arrives here with no overt open area. The covering is thick callus/eschar. Nevertheless there is no open area here. He has some tenderness underneath the area but no overt infection is observed no drainage. The patient has a deformity in the foot with this area weekly to be exposed to more pressure in his foot where nevertheless that something we are going to have to deal with going forward. The patient has diabetic workboots and diabetic shoes. He has had a long difficult course with the area here. This started as a fracture at work. He had bone grafting from his calcaneus and screws. This got infected he had to have more surgery on the area. Bone at the time of that surgery I think showed enterococcus and group B strep. He had 6 weeks of Rocephin. Since then the area has waxed and waned in its difficulty. Recently he had an MRI in December that did not show osteomyelitis nevertheless he had an abscess that grew MRSA and enterococcus which I elected to treat with Zyvox. This was in December and this wound is actually "healed" over 08/21/16; the patient came in today for his one-month follow-up visit. The area on the lateral aspect of his left foot looks much the same as some month ago. There is no evidence of an open wound here. However the patient tells me about a week after he went back to work he developed  severe pain and swelling in the plantar aspect of his right foot first and fifth metatarsal heads. 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 He went to see his podiatrist at [facility]. She told him that he could not work on his feet. She told him to go back and his cam walking boot on the left. Not have open wounds obviously on the right. The patient is actually gone ahead and retired from his job because he does not feel he can work on his feet 09/18/16; patient comes back in saying he recently had some pain on the lateral aspect of his left foot. Asked that we look at this. Other than that all of his wounds have a viable surface. He has diabetic shoes. He is retired from work. READMISSION [name] is a 61 year old man who we cared for for a prolonged period of time in late 2017 into March 2018. At that point he had suffered a fracture of his left fifth metatarsal at about the level of the  base of the fifth metatarsal. He had surgical repair by Dr. Meryl Dare and he developed a very refractory wound over the fifth metatarsal. Ultimately this became infected he required hardware removal this eventually closed over and we discharged from the clinic in March of this year. He tells me as well until roughly 3 weeks ago when he developed increasing pain in the same area making it difficult for him to sleep. He was admitted to hospital from 9/5 through 9/7 with now an ulcer in the same area on the lateral aspect of his left foot at the level of the base of the metatarsal. X- rays and MRIs during this hospitalization were negative. Wound culture showed a combination of Escherichia coli, Morganella and Pseudomonas. He was given IV Cipro and vancomycin the hospital and then discharged home on Cipro and Doxy. He returned to the ER on 9/16 with leg swelling and discomfort. I don't think anything was added at that point  although he did have an ultrasound of the left leg that was negative for DVT He was back in the ER on 10/4 again with . pain in the area he was given Bactrim but states he had a significant allergic reaction to that which has abated once he stopped the Bactrim. I don't think he had a full course. He went to see his podiatrist yesterday who recommended some form of foot soak. He has a Darco forefoot offloading boot The patient is a type II diabetic on insulin. T me his recent hemoglobin A1c was 7. Arterial studies done in July 2017 showed an ABI in the right of 1.3 on the ells left 1.2 to biphasic waveforms bilaterally. He was not felt to have arterial disease. As noted the patient has had 2 MRIs most recently on 10/4 this showed no evidence of an abscess or osteomyelitis and no change since the prior study of 03/05/17 there was nonspecific edema on the dorsum of the foot. ABIs in this clinic today 1.2 which would be unchanged from his study done in July/17 04/15/17; now down to 1 small wound on the left lateral foot. We wrapped him and applied collagen last week and the foot is fairly macerated. 04/22/17; small wound on the left lateral foot however it has some undermining. Using collagen 04/29/17; small wound on the left lateral foot some surrounding callus. Chronic damage in this area as a result of initial fracture nonhealing and secondary infection. 05/06/17; the patient arrives today with the area on the left lateral foot closed. There is thick subcutaneous tissue with a layer of callus. I took some of the callus off just to ensure adequate closure of the underlying tissue and there is no open wound here. He has considerable deformity of this area of his foot however and unless there is an ability to offload this I think opening is going to be recurrent. He tells me he has diabetic foot wear although I have not actually seen this Readmission: 09/02/17 on evaluation today patient appears to be doing  a little better compared to what he tells me what's going on in regard to his left lateral foot. He was previously seen in the ER this past Saturday it is now Tuesday. On Saturday he actually was treated with antibiotics including Cipro 500 mg two times a day and doxycycline 100 mg two times a day. Subsequently he also did have an x-ray performed of his left foot which revealed increased degenerative changes of the base of the fifth metatarsal since  comparison in 2018. There was no radiographic evidence of osteomyelitis however. Patient has previously had an MRI [name], [name] ([id number]) [id number] Page 10 of 13 of the left foot which was performed on 04/03/17 in this revealed at that point a soft tissue ulceration but no evidence of osteomyelitis. Patient has previously undergone testing in regard to his ABI's by Dr. Bridgett Larsson and this showed that he did have ABI was within normal limits which does correspond with ABI's we checked here in the office today. That study was on 01/13/16. With all that being said on evaluation today there does not appear to be any evidence of a true open wound of the left lateral fifth metatarsal region. He does have tenderness noted as well is a little bit of boggy/fluctuance feeling to the area in this region of his foot and there is definitely a very solid and firm callous noted laterally. With that being said I am not able to find any obvious opening at this point there does appear to be a spot where there appears to been a opening very recently however patient states he has not noted any drainage from this currently. He does however have discomfort with palpation of this area this is in the 3-4/10 range only with very firm palpation this is not too significant at all. Subsequently he does have a small ulcer at the medial nail bed of the right second toe where he inadvertently pulled off a portion of the toenail softly causing this  injury. Otherwise there does not appear to be any evidence of infection in regard to this right second toe. 09/23/17 on evaluation today patient actually appears to be doing well he has no open ulcerations noted at this point. With that being said he did have significant callous noted over the left lateral foot which did require callous pairing today he tolerated this without complication. Readmission: 11/07/17 patient presents today for readmission concerning to ulcers that he has. One on the right plantar fifth metatarsal region and the other on the left plantar fifth metatarsal region. He has previously had an x-ray which was performed on 11/05/17 and revealed that the patient had wounds noted of the base of the fifth metatarsal region without evidence of destruction to the bone to suggest osteomyelitis. Obviously this is excellent news. With that being said he does have a significantly large ulcer that extensively plantar surface of the wound and is concerning for the fact that he continues to walk on this due to the patient telling me that he "has to work" I do believe this has likely led to both the opening of the wound as well as the fact that is not really able to heal very well at this point. He has been seen by his podiatrist and apparently according to the note dated 10/23/17 the patient was provided with Santyl and recommended a dry dressing. No wound culture has been obtained at this point. As stated above he has had returned to work due to financial reasons and is not able to wear his postop shoes due to the fact that he cannot wear this and work. The wound is stated to have been present for "several weeks" prior to the visit with the podiatrist on 10/23/17. With that being said again at least the good news is there does not appear to be any evidence of osteomyelitis. 11/14/17 on evaluation today patient appears to be doing poorly in regard to his bilateral foot ulcers. Unfortunately he seems to  have significant callous with a lot of maceration underlined especially on the right even more so than the left. He has been tolerating the dressing changes without complication. With that being said overall I do think he's gonna have to have a significant debridement today. 11/21/17 on evaluation today patient presents for follow-up concerning his bilateral lateral foot ulcers. He has been tolerating the dressing changes without complication. His wife helps perform these for him. With that being said the patient states that the left lateral foot ulcer actually is continuing to give him trouble as far as discomfort is concerned. With that being said actually appears better. 11/28/17 on evaluation today patient appears to be doing poorly in regard to his left lower extremity ulcer in particular. His right as made some progress on the lateral portion although the plantar portion is actually getting somewhat worse I think this is likely due to the fact that he continues to work although it hurts and he's been having a lot of issues out of this side and as far as pain is concerned. With that being said my biggest concern on evaluation today is that of the patient having issues with increased pain in the left lower extremity ulcer site especially on the plantar aspect. He also appears to have what seems to be an abscess developing in the dorsal/lateral portion of his foot which I think likely is communicating somewhere internally with the ulcer site. Nonetheless he has not had any fevers, chills, nausea, or vomiting noted at this time. He has however had fairly significant increase in pain and he was Artie having a lot of pain previous. Readmission: 01-29-2022 upon evaluation today patient presents for initial inspection here in our clinic concerning the current issue which has been present since around Oct 29, 2021. The patient tells me that he has at this point been seen by Dr. Vickki Muff who has been managing  this up to this point. He has also been seen by Dr. Lazaro Arms I will outline the findings there shortly as well. With that being said currently according to Dr. Alvera Singh note on 12-18-2021 the patient has a prescription for Zosyn that was given by infectious disease. Again that was back in June however. There was a hospital encounter for incision and drainage and debridement of the foot in order to clearway necrotic tissue. The patient also underwent vascular evaluation with Dr. Lucky Cowboy. Nonetheless on the postop visit on 12-18-2021 the patient was seeming to be doing quite well based on what Dr. Vickki Muff said but nonetheless since that time is really not made much progress and is concerned about losing his foot. He therefore requested a referral to wound care to be seen and evaluated and see if there is anything we can do to help and aid with getting this to heal. Fortunately there does not appear to be any evidence of active infection locally or systemically at this time which is great news. He tells me that he is currently using Betadine wet-to-dry dressings. He does not note any systemic infections but does have evidence of local infection based on what we are seeing. He has had a recent chest x-ray which showed that he had no evidence of acute cardiopulmonary disease that was performed on 12-12-2021. He also underwent vascular intervention with Dr. 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 Subsequently the patient states that Dr. Lucky Cowboy has mentioned that he would need to go back at some time with Dr. Lucky Cowboy said he would consider a posterior tibial approach for revascularization in the future but at this time because of kidney status and the amount of dye used he did not [name] to proceed any further at that point. Again that something that would likely be scheduled  down the road. Nonetheless right now the patient does have an ABI on the left of 0.90 with a TBI of 0.6-0 but this was decreased compared to previous. His ABI on the right was 1.08. Patient did have as well MRI of the left foot which was performed on 12-12-2021 and this shows that he does have interval resection of the proximal third of the fifth metatarsal and area of osteomyelitis on prior MRI. There is persistent ulceration of plantar lateral aspect at the resection site with a sinus tract extending towards the adjacent cuboid bone findings suspicious for osteomyelitis of the plantar lateral aspect of the cuboid and adjacent base of the fourth metatarsal possibly in the proximal most aspect of the remaining fifth metatarsal shaft as well. The ABI performed on 12-13-2021 showed that the left ABI was 0.83 which had previously been registered at 1.35 and the right ABI was 1.20. I think this is consistent with being able to heal but obviously would do better with vascular intervention not something that is going to be looked into in the future. This was noted above as well. Based on what I am seeing today I do believe that the patient is definitely a candidate for HBO therapy I think this may be in his best interest as I think he is at the point of being in a limb threatening situation. I do believe that he would benefit from hyperbarics along with continued antibiotics along with good wound care measures as well. 02-04-2022 upon evaluation today patient appears to be doing still poorly in regard to his foot he is having a lot of drainage and he notes odor as well. With that being said we still have not heard anything back from insurance yet as far as approval for HBO therapy. Subsequently the patient does have evidence of osteomyelitis and I think hyperbarics is definitely going to be something that would be beneficial for him. He has been on antibiotics several times previous but unfortunately this just  does not seem to be sustained enough we did do a culture it showed Pseudomonas as one of the primary organisms both organisms would be treated with Levaquin which I think is going to be the best way to go. I discussed that with the patient today and I Georgina Peer send that into the pharmacy. 02-11-2022 upon evaluation today patient appears to be doing well currently in regard to his wound. I do feel like the Levaquin is doing better for him which is great news. Fortunately I do not see any evidence of active infection locally or systemically at this time which is great news. No fevers, chills, nausea, vomiting, or diarrhea. 02-19-2022 upon evaluation today patient appears to be doing excellent in regard to his foot ulcer all things considered he still has bone exposed but the good news is we are at least making some progress here I think he does have a lot of odor he tells me and I think switching over to Dakin's moistened gauze dressing is probably can be the way to go. 02-26-2022 upon evaluation today patient  appears to be doing well currently in regard to his wound all things considered. Although the 1 thing I do see is that he is not changing the dressing as frequently as he should be. Fortunately there does not appear to be any signs of active infection he tells me his wife is out of town from Saturday through today so it has not been changed since Saturday before that they have been doing this every other day. I explained that with the Dakin's he needs to do this every day in order to make sure that it heals appropriately. This will also help with the odor which she has been complaining of. 03-11-2022 upon evaluation today patient appears to be doing better overall visually in regard to his wound. He is tolerating the dressing changes without complication. Fortunately there does not appear to be any evidence of active infection locally or systemically at this time. No fevers, chills, nausea, vomiting, or  diarrhea. JERMIAH, SODERMAN ([id number]) [id number] Page 11 of 13 03-19-2022 upon evaluation today patient appears to be doing decently well in regard to his wound. He is actually tolerating dressing changes without Complication. Fortunately there does not appear to be any signs of active infection locally or systemically at this time which is great news. No fevers, chills, nausea, vomiting, or diarrhea. 04-02-2022 upon evaluation today patient's wound is actually showing signs of significant improvement I am actually very pleased with where we stand today. I do not see any evidence of infection locally or systemically at this time which is great news. 04-08-2022 upon evaluation today patient actually appears to be making good progress. I am very pleased with where things stand his wound is smaller he seems to be tolerating hyperbarics well he is doing well with wound care and in general I think we are making excellent headway here. I do believe that he would likely benefit from extending the hyperbarics. I think that he is making such good progress I do not [name] to see anything backtrack on him at this point. Overall the benefit coupled with the antibiotics and an excellent wound care that we have been working with him with and that his wife is continuing to do at home I think he is making a great impact in getting this healed to prevent limb loss. 04-16-2022 upon evaluation today patient appears to be doing excellent with the [facility] dressing. Coupled with hyperbarics he is managing quite nicely and healing quite nicely. Fortunately I do not see any evidence of active infection locally or systemically which is great news and overall I am extremely pleased with where we stand today. No fevers, chills, nausea, vomiting, or diarrhea. 04-23-2022 upon evaluation today patient appears to be doing well currently in regard to his wound for the most part although there is some  area of rubbing on the lateral aspect as well as the top of his foot. When questioned he actually has been more active over the weekend he tells me that it was his birthday and he had some friends over. Subsequently I think this is what led to things being somewhat worse compared to what they were previous. Fortunately I do not see any evidence of active infection locally or systemically at this time which is great news. No fevers, chills, nausea, vomiting, or diarrhea. We are still waiting on the approval for continuation of HBO therapy. 04-30-2022 upon evaluation today patient's wound is actually showing signs of improvement. He continues to utilize the knee scooter which  I think is doing well for him. The volume continues to improve the size got a little worse last week due to the fact that he was up on this more. The good news is he is actually back down to more normal situation at this point today. There is some need for sharp debridement. 11/7 patient was seen in conjunction with HBO which she is tolerating well. HBO dive #[id number] today. Wound is on the left plantar foot on the lateral aspect. He has been making excellent progress. 05-14-2022 upon evaluation today patient appears to be doing well currently in regard to his wound. Has been tolerating the dressing changes without complication and overall I am extremely pleased with where we stand currently. There is no evidence of active infection locally nor systemically at this time. 05-21-2022 upon evaluation today patient appears to be doing well currently in regard to his wound. He has been tolerating the dressing changes without complication. He does have some swelling and weeping on the lateral portion of his foot. With that being said I do believe that he needs to do something to try to get the swelling under control. I do think an Ace wrap may be best to go back to at this point as well. Fortunately I think other than this the wound is  making excellent progress. 05-28-2022 upon evaluation today patient appears to be doing well currently for the most part in regard to his foot ulcer though he does have a lot of debridement that needs to be undertaken today he has a lot of callus buildup around the edges of the wound which I think is affecting his healing as well. Fortunately I do not see any signs of active infection locally nor systemically at this time which is great news. No fevers, chills, nausea, vomiting, or diarrhea. 06-11-2022 upon evaluation today patient's wound is showing some signs of callus buildup he did have to be on this a bit more over the last week due to the fact that his brother-in-law unfortunately passed. He tells me that he was not able to use his knee scooter as much which is unfortunate but nonetheless he does not seem to be showing any signs of infection at this point which is great news. No fevers, chills, nausea, vomiting, or diarrhea. Objective Constitutional Well-nourished and well-hydrated in no acute distress. Vitals Time Taken: 8:18 AM, Height: 70 in, Weight: 255 lbs, BMI: 36.6, Temperature: 97.8 F, Pulse: 91 bpm, Respiratory Rate: 16 breaths/min, Blood Pressure: 142/81 mmHg. Respiratory normal breathing without difficulty. Psychiatric this patient is able to make decisions and demonstrates good insight into disease process. Alert and Oriented x 3. pleasant and cooperative. General Notes: Upon inspection patient's wound bed actually showed signs of good granulation epithelization at this point. Fortunately I do not see any evidence of active infection locally nor systemically which is great news and overall I am extremely pleased with where we stand. I did perform a fairly aggressive sharp debridement clearway some of the necrotic debris tolerated that today without complication postdebridement the wound bed is significantly improved. Integumentary (Hair, Skin) Wound #14 status is Open.  Original cause of wound was Blister. The date acquired was: 10/29/2021. The wound has been in treatment 19 weeks. The wound is located on the Left,Lateral Foot. The wound measures 4cm length x 2cm width x 0.3cm depth; 6.283cm^2 area and 1.885cm^3 volume. There is muscle and Fat Layer (Subcutaneous Tissue) exposed. There is no tunneling or undermining noted. There is a medium amount  of serosanguineous drainage noted. The wound margin is epibole. There is large (67-100%) red granulation within the wound bed. There is a small (1-33%) amount of necrotic tissue within the wound bed including Adherent Slough. Assessment [address] [place], [place] ([id number]) [id number] Page 12 of 13 ICD-10 Chronic multifocal osteomyelitis, left ankle and foot Type 2 diabetes mellitus with foot ulcer Non-pressure chronic ulcer of other part of left foot with necrosis of bone Disruption of external operation (surgical) wound, not elsewhere classified, initial encounter Procedures Wound #14 Pre-procedure diagnosis of Wound #14 is a Diabetic Wound/Ulcer of the Lower Extremity located on the Left,Lateral Foot .Severity of Tissue Pre Debridement is: Fat layer exposed. There was a Excisional Skin/Subcutaneous Tissue Debridement with a total area of 15 sq cm performed by Tommie Sams., PA-C. With the following instrument(s): Curette to remove Viable and Non-Viable tissue/material. Material removed includes Callus, Subcutaneous Tissue, and Slough. A time out was conducted at 08:38, prior to the start of the procedure. A Minimum amount of bleeding was controlled with Pressure. The procedure was tolerated well. Post Debridement Measurements: 4cm length x 2.5cm width x 0.3cm depth; 2.356cm^3 volume. Character of Wound/Ulcer Post Debridement is stable. Severity of Tissue Post Debridement is: Fat layer exposed. Post procedure Diagnosis Wound #14: Same as Pre-Procedure Plan Follow-up  Appointments: Return Appointment in 1 week. Bathing/ Shower/ Hygiene: Wash wounds with antibacterial soap and water. May shower; gently cleanse wound with antibacterial soap, rinse and pat dry prior to dressing wounds Anesthetic (Use 'Patient Medications' Section for Anesthetic Order Entry): Lidocaine applied to wound bed Edema Control - Lymphedema / Segmental Compressive Device / Other: Elevate, Exercise Daily and Avoid Standing for Long Periods of Time. Elevate legs to the level of the heart and pump ankles as often as possible Elevate leg(s) parallel to the floor when sitting. Off-Loading: Foot Defender  - left foot Other: - knee scooter Additional Orders / Instructions: Vitamin A; Vitamin C, Zinc - Use AandD oinment to leg/foot do not use in wounds Other: - need daily dressing changes WOUND #14: - Foot Wound Laterality: Left, Lateral Cleanser: [place] Ancillary Kit - 15 Day Supply (DME) (Generic) 3 x Per Week/30 Days Discharge Instructions: Use supplies as instructed; Kit contains: (15) Saline Bullets; (15) 3x3 Gauze; 15 pr Gloves Cleanser: Wound Cleanser 3 x Per Week/30 Days Discharge Instructions: Wash your hands with soap and water. Remove old dressing, discard into plastic bag and place into trash. Cleanse the wound with Wound Cleanser prior to applying a clean dressing using gauze sponges, not tissues or cotton balls. Do not scrub or use excessive force. Pat dry using gauze sponges, not tissue or cotton balls. Peri-Wound Care: AandD Ointment 3 x Per Week/30 Days Discharge Instructions: Apply AandD Ointment as directed Prim Dressing: Hydrofera Blue Ready Transfer Foam, 4x5 (in/in) (Dispense As Written) 3 x Per Week/30 Days ary Discharge Instructions: Apply Hydrofera Blue Ready to wound bed as directed Secondary Dressing: ABD Pad 5x9 (in/in) (DME) (Generic) 3 x Per Week/30 Days Discharge Instructions: Cover with ABD pad Secondary Dressing: Gauze (DME) (Generic) 3 x Per Week/30  Days Discharge Instructions: As directed: dry, moistened with saline or moistened with Dakins Solution Secured With: ACE WRAP - 12M ACE Elastic Bandage With VELCRO Brand Closure, 4 (in) 3 x Per Week/30 Days Secured With: Medipore T - 12M Medipore H Soft Cloth Surgical T ape ape, 2x2 (in/yd) (Generic) 3 x Per Week/30 Days Secured With: [employer] Sterile or Non-Sterile 6-ply 4.5x4 (yd/yd) (Generic) 3 x Per  Week/30 Days Discharge Instructions: Apply Kerlix as directed 1. I am going to recommend that we have the patient continue to monitor for any signs of infection or worsening. Obviously based on what I am seeing I do feel like that we are making good progress here. 2. I am also can recommend that the patient should continue to elevate his legs at this point and keep pressure off of the foot as well I think the knee scooter is the best way to go about this. We will see patient back for reevaluation in 1 week here in the clinic. If anything worsens or changes patient will contact our office for additional recommendations. Electronic Signature(s) Signed: 06/11/2022 9:19:33 AM By: Worthy Keeler PA-C Entered By: Worthy Keeler on 06/11/2022 09:19:33 [name] ([id number]) [id number] Page 13 of 13 -------------------------------------------------------------------------------- SuperBill Details Patient Name: Date of Service: Arie Sabina LD F. 06/11/2022 Medical Record Number: [id number] Patient Account Number: [id number] Date of Birth/Sex: Treating RN: Nov 19, 1960 (61 y.o. [name]) Carlene Coria Primary Care Provider: [name] Other Clinician: Massie Kluver Referring Provider: Treating Provider/Extender: [name] in Treatment: 19 Diagnosis Coding ICD-10 Codes Code Description [phone] Chronic multifocal osteomyelitis, left ankle and foot E11.621 Type 2 diabetes mellitus with foot ulcer L97.524 Non-pressure chronic ulcer of other  part of left foot with necrosis of bone T81.31XA Disruption of external operation (surgical) wound, not elsewhere classified, initial encounter Facility Procedures : CPT4 Code: [id number] Description: [place] - DEB SUBQ TISSUE 20 SQ CM/< ICD-10 Diagnosis Description L97.524 Non-pressure chronic ulcer of other part of left foot with necrosis of bone Modifier: Quantity: 1 Physician Procedures : CPT4 Code Description Modifier [id number] - WC PHYS SUBQ TISS 20 SQ CM ICD-10 Diagnosis Description L97.524 Non-pressure chronic ulcer of other part of left foot with necrosis of bone Quantity: 1 Electronic Signature(s) Signed: 06/11/2022 9:19:44 AM By: Worthy Keeler PA-C Entered By: Worthy Keeler on 06/11/2022 09:19:43

## 2022-06-11 NOTE — Progress Notes (Signed)
  Subjective:  Patient ID: [name], male    DOB: 06-01-1961,  MRN: [id number]  Chief Complaint  Patient presents with   Diabetic Ulcer    (np) left foot infection -Diabetic, A1C  7.7    61 y.o. male presents with the above complaint. History confirmed with patient.  He has had wounds and infection on both feet.  His most recent infection was over the summer and underwent surgery in June with Dr. Vickki Muff at [facility].  He has been going to wound care center for ongoing wound care.  [name] he was doing fairly well until about a week ago when he noticed that started to open up he had to be on his feet a lot for a family member's funeral  Objective:  Physical Exam: warm, good capillary refill, no trophic changes or ulcerative lesions, and palpable DP, nonpalpable PT pulse, cavovarus deformity of foot with ulceration on plantar cuboid fifth metatarsal base area, there is some serous drainage, surrounding hyperkeratosis.   Radiographs: Multiple views x-ray of the left foot: Partial resection of cuboid and fourth and fifth metatarsal bases Assessment:   1. Ulcer of left foot with fat layer exposed ([place])   2. Acquired varus deformity of left foot   3. Type 2 diabetes mellitus with diabetic polyneuropathy, with long-term current use of insulin ([place])      Plan:  Patient was evaluated and treated and all questions answered.  I reviewed my clinical exam findings and impression of his radiographs today with the patient.  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  I discussed with him this is a severe deformity and reconstruction is often difficult especially when there is still an open wound and possible infection.  He does have increased drainage, I took a culture of this today and I recommend that we reevaluate with lab work as I do not see any recent evaluation of ESR CRP or CBC.  This was ordered.  He  will continue his wound care at the wound care center.  Due to the increasing signs symptoms I placed him on levofloxacin, day is have a history of Pseudomonas in this wound.  He has completed hyperbaric oxygen treatment, total contact casting may offer some options.  I also discussed with him the impact of smoking on his wound healing and encouraged him to cease smoking.  He had a vascular retrograde access procedure scheduled for July but did not complete this.  I encouraged him to reschedule with Dr. Lucky Cowboy at [facility].  He will return to see me as needed  No follow-ups on file.

## 2022-06-15 LAB — WOUND CULTURE

## 2022-06-18 ENCOUNTER — Ambulatory Visit: Payer: 59 | Admitting: Physician Assistant

## 2022-06-18 NOTE — Progress Notes (Signed)
FENIX, RORKE ([id number]) [id number] Page 1 of 8 Visit Report for 06/11/2022 Arrival Information Details Patient Name: Date of Service: [name] RO LD F. 06/11/2022 8:15 A M Medical Record Number: [id number] Patient Account Number: [id number] Date of Birth/Sex: Treating RN: [birth year] (61 y.o. [name]) Carlene Coria Primary Care [place] Devine: [name] Other Clinician: Massie Kluver Referring Warrene Kapfer: Treating Willadean Guyton/Extender: [name] in Treatment: 27 Visit Information History Since Last Visit All ordered tests and consults were completed: No Patient Arrived: Knee Scooter Added or deleted any medications: No Arrival Time: 08:11 Any new allergies or adverse reactions: No Transfer Assistance: None Had a fall or experienced change in No Patient Identification Verified: Yes activities of daily living that may affect Secondary Verification Process Completed: Yes risk of falls: Patient Requires Transmission-Based Precautions: No Signs or symptoms of abuse/neglect since last visito No Patient Has Alerts: No Hospitalized since last visit: No Implantable device outside of the clinic excluding No cellular tissue based products placed in the center since last visit: Has Dressing in Place as Prescribed: Yes Has Footwear/Offloading in Place as Prescribed: Yes Left: Other:defender boot Pain Present Now: Yes Electronic Signature(s) Signed: 06/18/2022 4:45:28 PM By: Massie Kluver Entered By: Massie Kluver on 06/11/2022 08:17:31 -------------------------------------------------------------------------------- Clinic Level of Care Assessment Details Patient Name: Date of Service: [name] RO LD F. 06/11/2022 8:15 A M Medical Record Number: [id number] Patient Account Number: [id number] Date of Birth/Sex: Treating RN: 07/23/1960 (61 y.o. [name]) Carlene Coria Primary Care Dali Kraner: [name] Other Clinician: Massie Kluver Referring  Xoey Warmoth: Treating Breigh Annett/Extender: [name] in Treatment: 19 Clinic Level of Care Assessment Items TOOL 1 Quantity Score [] - 0 Use when EandM and Procedure is performed on INITIAL visit ASSESSMENTS - Nursing Assessment / Reassessment [] - 0 General Physical Exam (combine w/ comprehensive assessment (listed just below) when performed on new pt. [address] JARRED, PURTEE ([id number]) [id number] Page 2 of 8 [] - 0 Comprehensive Assessment (HX, ROS, Risk Assessments, Wounds Hx, etc.) ASSESSMENTS - Wound and Skin Assessment / Reassessment [] - 0 Dermatologic / Skin Assessment (not related to wound area) ASSESSMENTS - Ostomy and/or Continence Assessment and Care [] - 0 Incontinence Assessment and Management [] - 0 Ostomy Care Assessment and Management (repouching, etc.) PROCESS - Coordination of Care [] - 0 Simple Patient / Family Education for ongoing care [] - 0 Complex (extensive) Patient / Family Education for ongoing care [] - 0 Staff obtains Programmer, systems, Records, T Results / Process Orders est [] - 0 Staff telephones HHA, Nursing Homes / Clarify orders / etc [] - 0 Routine Transfer to another Facility (non-emergent condition) [] - 0 Routine Hospital Admission (non-emergent condition) [] - 0 New Admissions / Biomedical engineer / Ordering NPWT Apligraf, etc. , [] - 0 Emergency Hospital Admission (emergent condition) PROCESS - Special Needs [] - 0 Pediatric / Minor Patient Management [] - 0 Isolation Patient Management [] - 0 Hearing / Language / Visual special needs [] - 0 Assessment of Community assistance (transportation, D/C planning, etc.) [] - 0 Additional assistance / Altered mentation [] - 0 Support Surface(s) Assessment (bed, cushion, seat, etc.) INTERVENTIONS - Miscellaneous [] - 0 External ear exam [] - 0 Patient Transfer (multiple staff / Civil Service fast streamer / Similar devices) [] - 0 Simple Staple /  Suture removal (25 or less) [] - 0 Complex Staple / Suture removal (26 or more) [] - 0 Hypo/Hyperglycemic Management (do not check if billed separately) [] -  0 Ankle / Brachial Index (ABI) - do not check if billed separately Has the patient been seen at the hospital within the last three years: Yes Total Score: 0 Level Of Care: ____ Electronic Signature(s) Signed: 06/18/2022 4:45:28 PM By: Massie Kluver Entered By: Massie Kluver on 06/11/2022 08:45:30 -------------------------------------------------------------------------------- Encounter Discharge Information Details Patient Name: Date of Service: [name] RO LD F. 06/11/2022 8:15 A M Medical Record Number: [id number] Patient Account Number: [id number] Date of Birth/Sex: Treating RN: 05-Apr-1961 ([address] y.o. [name], [name] ([id number]) [id number] Page 3 of 8 Primary Care Provider: [name] Other Clinician: Massie Kluver Referring Provider: Treating Provider/Extender: [name] in Treatment: 19 Encounter Discharge Information Items Post Procedure Vitals Discharge Condition: Stable Temperature (F): 97.8 Ambulatory Status: Knee Scooter Pulse (bpm): 91 Discharge Destination: Home Respiratory Rate (breaths/min): 18 Transportation: Private Auto Blood Pressure (mmHg): 142/81 Accompanied By: self Schedule Follow-up Appointment: Yes Clinical Summary of Care: Electronic Signature(s) Signed: 06/18/2022 4:45:28 PM By: Massie Kluver Entered By: Massie Kluver on 06/11/2022 08:55:01 -------------------------------------------------------------------------------- Lower Extremity Assessment Details Patient Name: Date of Service: [name] RO LD F. 06/11/2022 8:15 A M Medical Record Number: [id number] Patient Account Number: [id number] Date of Birth/Sex: Treating RN: 1961/02/07 (61 y.o. [name]) Carlene Coria Primary Care Provider: [name] Other Clinician:  Massie Kluver Referring Provider: Treating Provider/Extender: [name] in Treatment: 19 Vascular Assessment Pulses: Dorsalis Pedis Palpable: [Left:Yes] Electronic Signature(s) Signed: 06/11/2022 4:34:43 PM By: Carlene Coria RN Signed: 06/18/2022 4:45:28 PM By: Massie Kluver Entered By: Massie Kluver on 06/11/2022 08:30:05 -------------------------------------------------------------------------------- Multi Wound Chart Details Patient Name: Date of Service: [name] RO LD F. 06/11/2022 8:15 A M Medical Record Number: [id number] Patient Account Number: [id number] Date of Birth/Sex: Treating RN: 1961/06/15 (61 y.o. [name] Primary Care Provider: [name] Other Clinician: Massie Kluver Referring Provider: Treating Provider/Extender: [name] in Treatment: [address] [place] F ([id number]) [id number] Page 4 of 8 Vital Signs Height(in): 70 Pulse(bpm): 91 Weight(lbs): 255 Blood Pressure(mmHg): 142/81 Body Mass Index(BMI): 36.6 Temperature(F): 97.8 Respiratory Rate(breaths/min): 16 [14:Photos:] [N/A:N/A] Left, Lateral Foot N/A N/A Wound Location: Blister N/A N/A Wounding Event: Diabetic Wound/Ulcer of the Lower N/A N/A Primary Etiology: Extremity Dehisced Wound N/A N/A Secondary Etiology: Hypertension, Type II Diabetes, N/A N/A Comorbid History: Neuropathy 10/29/2021 N/A N/A Date Acquired: 41 N/A N/A Weeks of Treatment: Open N/A N/A Wound Status: No N/A N/A Wound Recurrence: Yes N/A N/A Pending A mputation on Presentation: 4x2x0.3 N/A N/A Measurements L x W x D (cm) 6.283 N/A N/A A (cm) : rea 1.885 N/A N/A Volume (cm) : 41.80% N/A N/A % Reduction in A rea: 93.00% N/A N/A % Reduction in Volume: Grade 3 N/A N/A Classification: Medium N/A N/A Exudate A mount: Serosanguineous N/A N/A Exudate Type: red, brown N/A N/A Exudate Color: Epibole N/A N/A Wound  Margin: Large (67-100%) N/A N/A Granulation A mount: Red N/A N/A Granulation Quality: Small (1-33%) N/A N/A Necrotic A mount: Fat Layer (Subcutaneous Tissue): Yes N/A N/A Exposed Structures: Muscle: Yes Fascia: No Tendon: No Joint: No Bone: No Small (1-33%) N/A N/A Epithelialization: Treatment Notes Electronic Signature(s) Signed: 06/18/2022 4:45:28 PM By: Massie Kluver Entered By: Massie Kluver on 06/11/2022 08:30:11 -------------------------------------------------------------------------------- Pain Assessment Details Patient Name: Date of Service: [name] RO LD F. 06/11/2022 8:15 A M Medical Record Number: [id number] Patient Account Number: [id number] Date of Birth/Sex: Treating RN: 10/31/1960 ([address] y.o. Elver, Stadler, Somis F ([id number]) [id number] Page  5 of 8 Primary Care Provider: [name] Other Clinician: Massie Kluver Referring Provider: Treating Provider/Extender: [name] in Treatment: 19 Active Problems Location of Pain Severity and Description of Pain Patient Has Paino Yes Site Locations Pain Location: Pain in Ulcers Duration of the Pain. Constant / Intermittento Constant Rate the pain. Current Pain Level: 6 Character of Pain Describe the Pain: Sharp, Stabbing Pain Management and Medication Current Pain Management: Medication: Yes Cold Application: No Rest: Yes Massage: No Activity: No T.E.N.S.: No Heat Application: No Leg drop or elevation: No Is the Current Pain Management Adequate: Inadequate How does your wound impact your activities of daily livingo Sleep: No Bathing: No Appetite: No Relationship With Others: No Bladder Continence: No Emotions: No Bowel Continence: No Work: No Toileting: No Drive: No Dressing: No Hobbies: No Electronic Signature(s) Signed: 06/11/2022 4:34:43 PM By: Carlene Coria RN Signed: 06/18/2022 4:45:28 PM By: Massie Kluver Entered By:  Massie Kluver on 06/11/2022 08:27:30 -------------------------------------------------------------------------------- Patient/Caregiver Education Details Patient Name: Date of Service: [name] RO LD F. 12/12/2023andnbsp8:15 A M Medical Record Number: [id number] Patient Account Number: [id number] Date of Birth/Gender: Treating RN: [birth year] (61 y.o. [name] Primary Care Physician: [name] Other Clinician: Massie Kluver Referring Physician: Treating Physician/Extender: [name] in Treatment: [address] [place] F ([id number]) [id number] Page 6 of 8 Education Assessment Education Provided To: Patient Education Topics Provided Wound/Skin Impairment: Handouts: Other: continue wound care as directed Methods: Explain/Verbal Responses: State content correctly Electronic Signature(s) Signed: 06/18/2022 4:45:28 PM By: Massie Kluver Entered By: Massie Kluver on 06/11/2022 08:52:20 -------------------------------------------------------------------------------- Wound Assessment Details Patient Name: Date of Service: [name] RO LD F. 06/11/2022 8:15 A M Medical Record Number: [id number] Patient Account Number: [id number] Date of Birth/Sex: Treating RN: 1961-04-26 (61 y.o. [name]) Carlene Coria Primary Care Provider: [name] Other Clinician: Massie Kluver Referring Provider: Treating Provider/Extender: [name] in Treatment: 19 Wound Status Wound Number: 14 Primary Etiology: Diabetic Wound/Ulcer of the Lower Extremity Wound Location: Left, Lateral Foot Secondary Etiology: Dehisced Wound Wounding Event: Blister Wound Status: Open Date Acquired: 10/29/2021 Comorbid History: Hypertension, Type II Diabetes, Neuropathy Weeks Of Treatment: 19 Clustered Wound: No Pending Amputation On Presentation Photos Wound Measurements Length: (cm) 4 Width: (cm) 2 Depth: (cm) 0.3 Area: (cm)  6.283 Volume: (cm) 1.885 % Reduction in Area: 41.8% % Reduction in Volume: 93% Epithelialization: Small (1-33%) Tunneling: No Undermining: No Wound Description Classification: Grade 3 Wound Margin: STEVENSON, WINDMILLER ([id number]) Exudate Amount: Medium Exudate Type: Serosanguineous Exudate Color: red, brown Foul Odor After Cleansing: No Slough/Fibrino Yes [id number] Page 7 of 8 Wound Bed Granulation Amount: Large (67-100%) Exposed Structure Granulation Quality: Red Fascia Exposed: No Necrotic Amount: Small (1-33%) Fat Layer (Subcutaneous Tissue) Exposed: Yes Necrotic Quality: Adherent Slough Tendon Exposed: No Muscle Exposed: Yes Necrosis of Muscle: No Joint Exposed: No Bone Exposed: No Treatment Notes Wound #14 (Foot) Wound Laterality: Left, Lateral Cleanser [place] Ancillary Kit - 15 Day Supply Discharge Instruction: Use supplies as instructed; Kit contains: (15) Saline Bullets; (15) 3x3 Gauze; 15 pr Gloves Wound Cleanser Discharge Instruction: Wash your hands with soap and water. Remove old dressing, discard into plastic bag and place into trash. Cleanse the wound with Wound Cleanser prior to applying a clean dressing using gauze sponges, not tissues or cotton balls. Do not scrub or use excessive force. Pat dry using gauze sponges, not tissue or cotton balls. Peri-Wound Care AandD Ointment Discharge Instruction: Apply AandD Ointment as directed Topical  Primary Dressing Hydrofera Blue Ready Transfer Foam, 4x5 (in/in) Discharge Instruction: Apply Hydrofera Blue Ready to wound bed as directed Secondary Dressing ABD Pad 5x9 (in/in) Discharge Instruction: Cover with ABD pad Gauze Discharge Instruction: As directed: dry, moistened with saline or moistened with Dakins Solution Secured With ACE WRAP - 85M ACE Elastic Bandage With VELCRO Brand Closure, 4 (in) Medipore T - 85M Medipore H Soft Cloth Surgical T ape ape, 2x2 (in/yd) Kerlix Roll  Sterile or Non-Sterile 6-ply 4.5x4 (yd/yd) Discharge Instruction: Apply Kerlix as directed Compression Wrap Compression Stockings Add-Ons Electronic Signature(s) Signed: 06/11/2022 4:34:43 PM By: Carlene Coria RN Signed: 06/18/2022 4:45:28 PM By: Massie Kluver Entered By: Massie Kluver on 06/11/2022 08:29:47 -------------------------------------------------------------------------------- [place] Details Patient Name: Date of Service: [name] RO LD F. 06/11/2022 8:15 A Radene Journey ([id number]) [id number] Page 8 of 8 Medical Record Number: [id number] Patient Account Number: [id number] Date of Birth/Sex: Treating RN: Nov 15, 1960 (61 y.o. [name]) Carlene Coria Primary Care Monia Timmers: [name] Other Clinician: Massie Kluver Referring Dannis Deroche: Treating Shawna Wearing/Extender: [name] in Treatment: 19 Vital Signs Time Taken: 08:18 Temperature (F): 97.8 Height (in): 70 Pulse (bpm): 91 Weight (lbs): 255 Respiratory Rate (breaths/min): 16 Body Mass Index (BMI): 36.6 Blood Pressure (mmHg): 142/81 Reference Range: 80 - 120 mg / dl Electronic Signature(s) Signed: 06/18/2022 4:45:28 PM By: Massie Kluver Entered By: Massie Kluver on 06/11/2022 08:20:41

## 2022-06-20 ENCOUNTER — Ambulatory Visit: Payer: 59 | Admitting: Physician Assistant

## 2022-06-27 ENCOUNTER — Other Ambulatory Visit
Admission: RE | Admit: 2022-06-27 | Discharge: 2022-06-27 | Disposition: A | Discharge status: Home / Self Care | Payer: 59 | Attending: Internal Medicine | Admitting: Internal Medicine

## 2022-06-27 ENCOUNTER — Encounter: Payer: 59 | Admitting: Internal Medicine

## 2022-06-27 DIAGNOSIS — L97522 Non-pressure chronic ulcer of other part of left foot with fat layer exposed: Secondary | ICD-10-CM | POA: Insufficient documentation

## 2022-06-27 DIAGNOSIS — E11621 Type 2 diabetes mellitus with foot ulcer: Secondary | ICD-10-CM | POA: Diagnosis not present

## 2022-06-29 LAB — AEROBIC CULTURE W GRAM STAIN (SUPERFICIAL SPECIMEN): Gram Stain: NONE SEEN

## 2022-07-02 ENCOUNTER — Ambulatory Visit: Payer: 59 | Admitting: Physician Assistant

## 2022-07-03 NOTE — Progress Notes (Signed)
EASTEN, MACEACHERN ([id number]) [id number] Page 1 of 6 Visit Report for 06/27/2022 Debridement Details Patient Name: Date of Service: [name] RO LD F. 06/27/2022 9:45 A M Medical Record Number: [id number] Patient Account Number: [id number] Date of Birth/Sex: Treating RN: [birth year] (62 y.o. [name]) Carlene Coria Primary Care Provider: [name] Other Clinician: Massie Kluver Referring Provider: Treating Provider/Extender: [name], [name] in Treatment: 21 Debridement Performed for Assessment: Wound #14 Left,Lateral Foot Performed By: Physician Ricard Dillon, MD Debridement Type: Debridement Severity of Tissue Pre Debridement: Fat layer exposed Level of Consciousness (Pre-procedure): Awake and Alert Pre-procedure Verification/Time Out Yes - 10:10 Taken: Start Time: 10:10 T Area Debrided (L x W): otal 5 (cm) x 4.5 (cm) = 22.5 (cm) Tissue and other material debrided: Viable, Non-Viable, Callus, Subcutaneous, Skin: Dermis , Skin: Epidermis Level: Skin/Subcutaneous Tissue Debridement Description: Excisional Instrument: Blade, Forceps, Scissors Bleeding: Minimum Hemostasis Achieved: Pressure Response to Treatment: Procedure was tolerated well Level of Consciousness (Post- Awake and Alert procedure): Post Debridement Measurements of Total Wound Length: (cm) 5.1 Width: (cm) 2.3 Depth: (cm) 0.5 Volume: (cm) 4.606 Character of Wound/Ulcer Post Debridement: Stable Severity of Tissue Post Debridement: Fat layer exposed Post Procedure Diagnosis Same as Pre-procedure Electronic Signature(s) Signed: 06/27/2022 4:17:44 PM By: [name] MD Signed: 06/28/2022 11:04:23 AM By: Carlene Coria RN Signed: 07/03/2022 2:03:30 PM By: Massie Kluver Entered By: Massie Kluver on 06/27/2022 10:16:06 Physical Exam Details -------------------------------------------------------------------------------- [name] ([id number])  [id number] Page 2 of 6 Patient Name: Date of Service: [name] LD F. 06/27/2022 9:45 A M Medical Record Number: [id number] Patient Account Number: [id number] Date of Birth/Sex: Treating RN: 1960-07-18 (62 y.o. [name]) Carlene Coria Primary Care Provider: [name] Other Clinician: Massie Kluver Referring Provider: Treating Provider/Extender: RO BSO N, [name] [name], [name] in Treatment: 21 Constitutional Sitting or standing Blood Pressure is within target range for patient.. Pulse regular and within target range for patient.Marland Kitchen Respirations regular, non-labored and within target range.. Temperature is normal and within the target range for the patient.Marland Kitchen appears in no distress. Notes Wound exam; the patient's wound is on the lateral aspect of his left foot. He had what looked to be superficially 2 areas with the bridge of skin and callus. You could probe under this from 1 part to another. Unfortunately also in the superior part of this wound there was undermining and tunneling. The area towards the medial foot was somewhat boggy. Compression over this revealed some purulent material which I have cultured. After extensive debridement today to remove all of the nonadherent skin and subcutaneous tissue he had exposed tendon and bone in the superior lateral part of this wound. This was unroofed, nothing about the debridement because of this. Electronic Signature(s) Signed: 06/27/2022 4:17:44 PM By: [name] MD Entered By: [name] on 06/27/2022 10:27:29 -------------------------------------------------------------------------------- Physician Orders Details Patient Name: Date of Service: [name] RO LD F. 06/27/2022 9:45 A M Medical Record Number: [id number] Patient Account Number: [id number] Date of Birth/Sex: Treating RN: 22-Nov-1960 (62 y.o. [name] Primary Care Provider: [name] Other Clinician: Massie Kluver Referring Provider: Treating Provider/Extender: RO BSO N, [name] in Treatment: 21 Verbal / Phone Orders: No Diagnosis Coding Follow-up Appointments Return Appointment in 1 week. Bathing/ [employer] wounds with antibacterial soap and water. May shower; gently cleanse wound with antibacterial soap, rinse and pat dry prior to dressing wounds Anesthetic (Use 'Patient Medications' Section  for Anesthetic Order Entry) Lidocaine applied to wound bed Edema Control - Lymphedema / Segmental Compressive Device / Other Elevate, Exercise Daily and A void Standing for Long Periods of Time. Elevate legs to the level of the heart and pump ankles as often as possible Elevate leg(s) parallel to the floor when sitting. Off-Loading Foot Defender - left foot Other: - knee scooter Additional Orders / Instructions Vitamin A Vitamin C, Zinc - Use AandD oinment to leg/foot do not use in wounds ; Other: - need daily dressing changes Wound Treatment Wound #14 - Foot Wound Laterality: Left, Lateral Cleanser: Byram Ancillary Kit - 15 Day Supply (Generic) 1 x Per Day/30 Days Discharge Instructions: Use supplies as instructed; Kit contains: (15) Saline Bullets; (15) 3x3 Gauze; 15 pr Gloves [name], [name] ([id number]) [id number] Page 3 of 6 Cleanser: Wound Cleanser 1 x Per Day/30 Days Discharge Instructions: Wash your hands with soap and water. Remove old dressing, discard into plastic bag and place into trash. Cleanse the wound with Wound Cleanser prior to applying a clean dressing using gauze sponges, not tissues or cotton balls. Do not scrub or use excessive force. Pat dry using gauze sponges, not tissue or cotton balls. Peri-Wound Care: AandD Ointment 1 x Per Day/30 Days Discharge Instructions: Apply AandD Ointment as directed Prim Dressing: Silvercel Small 2x2 (in/in) (DME) (Dispense As Written) 1 x Per Day/30 Days ary Discharge  Instructions: Apply Silvercel Small 2x2 (in/in) as instructed Secondary Dressing: ABD Pad 5x9 (in/in) (DME) (Generic) 1 x Per Day/30 Days Discharge Instructions: Cover with ABD pad Secondary Dressing: Gauze (DME) (Generic) 1 x Per Day/30 Days Discharge Instructions: As directed: dry, moistened with saline or moistened with Dakins Solution Secured With: ACE WRAP - 76M ACE Elastic Bandage With VELCRO Brand Closure, 4 (in) 1 x Per Day/30 Days Secured With: Medipore T - 76M Medipore H Soft Cloth Surgical T ape ape, 2x2 (in/yd) (Generic) 1 x Per Day/30 Days Secured With: Kerlix Roll Sterile or Non-Sterile 6-ply 4.5x4 (yd/yd) (Generic) 1 x Per Day/30 Days Discharge Instructions: Apply Kerlix as directed Laboratory Bacteria identified in Wound by Culture (MICRO) - culture obtained left plantar foot LOINC Code: 6195-0 Convenience Name: Wound culture routine Electronic Signature(s) Signed: 06/27/2022 4:17:44 PM By: [name] MD Signed: 07/03/2022 2:03:30 PM By: Massie Kluver Entered By: Massie Kluver on 06/27/2022 10:21:15 -------------------------------------------------------------------------------- Problem List Details Patient Name: Date of Service: [name] RO LD F. 06/27/2022 9:45 A M Medical Record Number: [id number] Patient Account Number: [id number] Date of Birth/Sex: Treating RN: 24-Jun-1961 (61 y.o. [name] Primary Care Provider: [name] Other Clinician: Massie Kluver Referring Provider: Treating Provider/Extender: RO BSO N, [name] in Treatment: 21 Active Problems ICD-10 Encounter Code Description Active Date MDM Diagnosis M86.372 Chronic multifocal osteomyelitis, left ankle and foot 01/29/2022 No Yes E11.621 Type 2 diabetes mellitus with foot ulcer 01/29/2022 No Yes L97.524 Non-pressure chronic ulcer of other part of left foot with necrosis of bone 01/29/2022 No Yes COYLE, STORDAHL ([id number]) [id number]  Page 4 of 6 T81.31XA Disruption of external operation (surgical) wound, not elsewhere classified, 01/29/2022 No Yes initial encounter Inactive Problems Resolved Problems Electronic Signature(s) Signed: 06/27/2022 4:17:44 PM By: [name] MD Entered By: [name] on 06/27/2022 10:25:51 -------------------------------------------------------------------------------- Progress Note Details Patient Name: Date of Service: [name] RO LD F. 06/27/2022 9:45 A M Medical Record Number: [id number] Patient Account Number: [id number] Date of Birth/Sex: Treating RN: 25-Dec-1960 (62 y.o. [name]) Carlene Coria Primary Care Provider: Alene Mires,  Minna Merritts Other Clinician: Massie Kluver Referring Provider: Treating Provider/Extender: RO BSO N, [name] in Treatment: 21 Subjective Objective Constitutional Sitting or standing Blood Pressure is within target range for patient.. Pulse regular and within target range for patient.Marland Kitchen Respirations regular, non-labored and within target range.. Temperature is normal and within the target range for the patient.Marland Kitchen appears in no distress. Vitals Time Taken: 9:47 AM, Height: 70 in, Weight: 255 lbs, BMI: 36.6, Temperature: 98.0 F, Pulse: 94 bpm, Respiratory Rate: 16 breaths/min, Blood Pressure: 135/75 mmHg. General Notes: Wound exam; the patient's wound is on the lateral aspect of his left foot. He had what looked to be superficially 2 areas with the bridge of skin and callus. You could probe under this from 1 part to another. Unfortunately also in the superior part of this wound there was undermining and tunneling. The area towards the medial foot was somewhat boggy. Compression over this revealed some purulent material which I have cultured. After extensive debridement today to remove all of the nonadherent skin and subcutaneous tissue he had exposed tendon and bone in the superior lateral part of this wound. This was unroofed, nothing about  the debridement because of this. Integumentary (Hair, Skin) Wound #14 status is Open. Original cause of wound was Blister. The date acquired was: 10/29/2021. The wound has been in treatment 21 weeks. The wound is located on the Left,Lateral Foot. The wound measures 4.2cm length x 3.5cm width x 0.3cm depth; 11.545cm^2 area and 3.464cm^3 volume. There is muscle and Fat Layer (Subcutaneous Tissue) exposed. Tunneling has been noted at 2:00 with a maximum distance of 2.3cm. Undermining begins at 12:00 and ends at 12:00 with a maximum distance of 1.2cm. There is a medium amount of serosanguineous drainage noted. The wound margin is epibole. There is large (67- 100%) red granulation within the wound bed. There is a small (1-33%) amount of necrotic tissue within the wound bed including Adherent Slough. Assessment [address] [name], [name] ([id number]) [id number] Page 5 of 6 ICD-10 Chronic multifocal osteomyelitis, left ankle and foot Type 2 diabetes mellitus with foot ulcer Non-pressure chronic ulcer of other part of left foot with necrosis of bone Disruption of external operation (surgical) wound, not elsewhere classified, initial encounter Procedures Wound #14 Pre-procedure diagnosis of Wound #14 is a Diabetic Wound/Ulcer of the Lower Extremity located on the Left,Lateral Foot .Severity of Tissue Pre Debridement is: Fat layer exposed. There was a Excisional Skin/Subcutaneous Tissue Debridement with a total area of 22.5 sq cm performed by Ricard Dillon, MD. With the following instrument(s): Blade, Forceps, and Scissors to remove Viable and Non-Viable tissue/material. Material removed includes Callus, Subcutaneous Tissue, Skin: Dermis, and Skin: Epidermis. A time out was conducted at 10:10, prior to the start of the procedure. A Minimum amount of bleeding was controlled with Pressure. The procedure was tolerated well. Post Debridement Measurements: 5.1cm length x  2.3cm width x 0.5cm depth; 4.606cm^3 volume. Character of Wound/Ulcer Post Debridement is stable. Severity of Tissue Post Debridement is: Fat layer exposed. Post procedure Diagnosis Wound #14: Same as Pre-Procedure Plan Follow-up Appointments: Return Appointment in 1 week. Bathing/ Shower/ Hygiene: Wash wounds with antibacterial soap and water. May shower; gently cleanse wound with antibacterial soap, rinse and pat dry prior to dressing wounds Anesthetic (Use 'Patient Medications' Section for Anesthetic Order Entry): Lidocaine applied to wound bed Edema Control - Lymphedema / Segmental Compressive Device / Other: Elevate, Exercise Daily and Avoid Standing for Long Periods of Time. Elevate legs to the  level of the heart and pump ankles as often as possible Elevate leg(s) parallel to the floor when sitting. Off-Loading: Foot Defender  - left foot Other: - knee scooter Additional Orders / Instructions: Vitamin A; Vitamin C, Zinc - Use AandD oinment to leg/foot do not use in wounds Other: - need daily dressing changes Laboratory ordered were: Wound culture routine - culture obtained left plantar foot WOUND #14: - Foot Wound Laterality: Left, Lateral Cleanser: Byram Ancillary Kit - 15 Day Supply (Generic) 1 x Per Day/30 Days Discharge Instructions: Use supplies as instructed; Kit contains: (15) Saline Bullets; (15) 3x3 Gauze; 15 pr Gloves Cleanser: Wound Cleanser 1 x Per Day/30 Days Discharge Instructions: Wash your hands with soap and water. Remove old dressing, discard into plastic bag and place into trash. Cleanse the wound with Wound Cleanser prior to applying a clean dressing using gauze sponges, not tissues or cotton balls. Do not scrub or use excessive force. Pat dry using gauze sponges, not tissue or cotton balls. Peri-Wound Care: AandD Ointment 1 x Per Day/30 Days Discharge Instructions: Apply AandD Ointment as directed Prim Dressing: Silvercel Small 2x2 (in/in) (DME) (Dispense  As Written) 1 x Per Day/30 Days ary Discharge Instructions: Apply Silvercel Small 2x2 (in/in) as instructed Secondary Dressing: ABD Pad 5x9 (in/in) (DME) (Generic) 1 x Per Day/30 Days Discharge Instructions: Cover with ABD pad Secondary Dressing: Gauze (DME) (Generic) 1 x Per Day/30 Days Discharge Instructions: As directed: dry, moistened with saline or moistened with Dakins Solution Secured With: ACE WRAP - 28M ACE Elastic Bandage With VELCRO Brand Closure, 4 (in) 1 x Per Day/30 Days Secured With: Medipore T - 28M Medipore H Soft Cloth Surgical T ape ape, 2x2 (in/yd) (Generic) 1 x Per Day/30 Days Secured With: Kerlix Roll Sterile or Non-Sterile 6-ply 4.5x4 (yd/yd) (Generic) 1 x Per Day/30 Days Discharge Instructions: Apply Kerlix as directed 1. Extensive debridement today. Unfortunately he has exposed bone and tendon and a small area on the superior lateral aspect of this now large open area. 2. Culture of purulent debris for CandS which will be done while he is on Levaquin which is started last week. #3 he is already completed HBO. 1 would wonder about another round of treatment down the road here. 4. I have removed all of the undermining which was extensive. Change the primary dressing to silver alginate which she will change daily. 5. He is in a Conservation officer, nature but looking at the way he walks he goes right over on the lateral part of his foot 6. I have not ordered an x-ray of the left foot. A repeat MRI here would probably be something that is quite possible Electronic Signature(s) Signed: 06/27/2022 4:17:44 PM By: [name] MD Entered By: [name] on 06/27/2022 10:30:15 [name] ([id number]) [id number] Page 6 of 6 -------------------------------------------------------------------------------- SuperBill Details Patient Name: Date of Service: [name] LD F. 06/27/2022 Medical Record Number: [id number] Patient Account Number:  [id number] Date of Birth/Sex: Treating RN: 01/18/1961 (62 y.o. [name]) Carlene Coria Primary Care Provider: [name] Other Clinician: Massie Kluver Referring Provider: Treating Provider/Extender: [name], [name] in Treatment: 21 Diagnosis Coding ICD-10 Codes Code Description [phone] Chronic multifocal osteomyelitis, left ankle and foot E11.621 Type 2 diabetes mellitus with foot ulcer L97.524 Non-pressure chronic ulcer of other part of left foot with necrosis of bone T81.31XA Disruption of external operation (surgical) wound, not elsewhere classified, initial encounter Facility Procedures : [place] Code: [id number] Description: [place]  SUBQ TISSUE 20 SQ CM/< ICD-10 Diagnosis Description E11.621 Type 2 diabetes mellitus with foot ulcer L97.524 Non-pressure chronic ulcer of other part of left foot with necrosis of bone Modifier: Quantity: 1 : CPT4 Code: [id number] Description: 03704 - DEB SUBQ TISS EA ADDL 20CM ICD-10 Diagnosis Description L97.524 Non-pressure chronic ulcer of other part of left foot with necrosis of bone Modifier: Quantity: 1 Physician Procedures : CPT4 Code Description Modifier [id number] 11042 - WC PHYS SUBQ TISS 20 SQ CM ICD-10 Diagnosis Description E11.621 Type 2 diabetes mellitus with foot ulcer L97.524 Non-pressure chronic ulcer of other part of left foot with necrosis of bone Quantity: 1 : [id number] 28003 - WC PHYS SUBQ TISS EA ADDL 20 CM ICD-10 Diagnosis Description L97.524 Non-pressure chronic ulcer of other part of left foot with necrosis of bone Quantity: 1 Electronic Signature(s) Signed: 06/27/2022 4:17:44 PM By: [name] MD Entered By: [name] on 06/27/2022 10:30:37

## 2022-11-08 ENCOUNTER — Encounter: Payer: Self-pay | Admitting: Anesthesiology

## 2022-11-28 ENCOUNTER — Ambulatory Visit: Admit: 2022-11-28 | Payer: 59 | Admitting: Ophthalmology

## 2022-11-28 SURGERY — PHACOEMULSIFICATION, CATARACT, WITH IOL INSERTION
Anesthesia: Topical | Laterality: Right

## 2023-07-03 ENCOUNTER — Encounter: Payer: Self-pay | Admitting: Urology

## 2023-07-03 ENCOUNTER — Ambulatory Visit (INDEPENDENT_AMBULATORY_CARE_PROVIDER_SITE_OTHER): Payer: Medicaid Other | Admitting: Urology

## 2023-07-03 VITALS — BP 157/83 | HR 102 | Ht 70.0 in | Wt 250.0 lb

## 2023-07-03 DIAGNOSIS — N529 Male erectile dysfunction, unspecified: Secondary | ICD-10-CM

## 2023-07-03 DIAGNOSIS — R3914 Feeling of incomplete bladder emptying: Secondary | ICD-10-CM

## 2023-07-03 DIAGNOSIS — R339 Retention of urine, unspecified: Secondary | ICD-10-CM

## 2023-07-03 DIAGNOSIS — R3912 Poor urinary stream: Secondary | ICD-10-CM | POA: Diagnosis not present

## 2023-07-03 DIAGNOSIS — Z125 Encounter for screening for malignant neoplasm of prostate: Secondary | ICD-10-CM | POA: Diagnosis not present

## 2023-07-03 DIAGNOSIS — R351 Nocturia: Secondary | ICD-10-CM

## 2023-07-03 DIAGNOSIS — R399 Unspecified symptoms and signs involving the genitourinary system: Secondary | ICD-10-CM

## 2023-07-03 LAB — BLADDER SCAN AMB NON-IMAGING

## 2023-07-03 MED ORDER — TAMSULOSIN HCL 0.4 MG PO CAPS: 0.4000 mg | ORAL_CAPSULE | Freq: Every day | ORAL | 11 refills | Status: AC

## 2023-07-03 MED ORDER — TADALAFIL 20 MG PO TABS: 20.0000 mg | ORAL_TABLET | Freq: Every day | ORAL | 11 refills | Status: AC | PRN

## 2023-07-03 NOTE — Progress Notes (Signed)
 07/03/23 10:25 AM   [name] Jan 04, 1961 [id number]  CC: BPH/LUTS, nocturia, ED, PSA screening  HPI: 63 year old male with medical history notable for obesity with BMI of 36, diabetes, sleep apnea compliant with CPAP who presents for further evaluation of BPH and urinary symptoms and ED.  He was previously followed by Dr. Ike as well as multiple urologist at [facility], and I reviewed those outside records extensively.  He reportedly had a greenlight laser PVP by Dr. Ike around 2014, but those records are not available.  He reports significant improvement in his urination after that procedure.  He also has been on Flomax  intermittently over the last few years which she did feel was helpful regarding his urinary symptoms.  His primary urinary complaints are weak stream, sensation of incomplete emptying, nocturia 3-4 times overnight.  Urinalysis today is pending, PVR today mildly elevated at .  IPSS score today is 16.  Most recent PSA was normal at 0.22 in September 2021.  CT from 2020 shows a 37 g prostate with a small defect from prior greenlight laser PVP, no hydronephrosis or stones.  He also has had long-term problems with ED, had bothersome side effects with penile injections of leg pain, previously discussed penile prosthesis with Dr. Lev[place] at [facility] but opted not to proceed.  He has not tried medications in a long time, and his diabetes is much better controlled than it was previously.  PMH: Past Medical History:  Diagnosis Date   Arthritis    Bacterial infection due to Morganella morganii 12/26/2017   BPH (benign prostatic hypertrophy)    Diabetes mellitus    Diabetic neuropathy (HCC)    Hypertension    Metatarsal bone fracture    left 5th toe   Osteomyelitis of toe of right foot (HCC)    Peripheral vascular disease (HCC)    Post-operative infection    and diabetic ulcer left foot   Sepsis due to Streptococcus, group B (HCC) 12/26/2017   Sleep apnea     does wear CPAP    Wears glasses     Surgical History: Past Surgical History:  Procedure Laterality Date   AMPUTATION TOE Left    small toe   AMPUTATION TOE Right 04/02/2019   Procedure: AMPUTATION TOE 2ND;  Surgeon: Ashley Soulier, DPM;  Location: [facility] OR[facility];  Service: Podiatry;  Laterality: Right;   COLONOSCOPY W/ BIOPSIES AND POLYPECTOMY     FRACTURE SURGERY     I & D EXTREMITY Left 01/11/2016   Procedure: LEFT FOOT IRRIGATION AND DEBRIDEMENT WOUND VAC AND REMOVAL OF HARDWARE;  Surgeon: Norleen Armor, MD;  Location: [facility];  Service: Orthopedics;  Laterality: Left;   IRRIGATION AND DEBRIDEMENT FOOT Left 12/14/2021   Procedure: IRRIGATION AND DEBRIDEMENT FOOT;  Surgeon: Ashley Soulier, DPM;  Location: [facility];  Service: Podiatry;  Laterality: Left;   LOWER EXTREMITY ANGIOGRAPHY N/A 12/01/2017   Procedure: Lower Extremity Angiography;  Surgeon: Marea Selinda RAMAN, MD;  Location: [facility];  Service: Cardiovascular;  Laterality: N/A;   LOWER EXTREMITY ANGIOGRAPHY Right 12/04/2017   Procedure: Lower Extremity Angiography;  Surgeon: Marea Selinda RAMAN, MD;  Location: [facility];  Service: Cardiovascular;  Laterality: Right;   LOWER EXTREMITY ANGIOGRAPHY Left 03/12/2018   Procedure: LOWER EXTREMITY ANGIOGRAPHY;  Surgeon: Marea Selinda RAMAN, MD;  Location: [facility];  Service: Cardiovascular;  Laterality: Left;   LOWER EXTREMITY ANGIOGRAPHY Left 12/17/2021   Procedure: Lower Extremity Angiography;  Surgeon: Marea Selinda RAMAN, MD;  Location:  [facility];  Service: Cardiovascular;  Laterality: Left;   ORIF TOE FRACTURE Left 06/08/2015   Procedure: OPEN REDUCTION INTERNAL FIXATION (ORIF) LEFT FIFTH METATARSAL BASE FRACTURE NONUNION; CALCANEAL AUTOGRAFT ;  Surgeon: Norleen Armor, MD;  Location: [facility];  Service: Orthopedics;  Laterality: Left;   PATELLA RECONSTRUCTION Left 2005   PROSTATE ABLATION  2014   TOE AMPUTATION     partial amputation right great toe   WOUND DEBRIDEMENT  Bilateral 11/30/2017   Procedure: DEBRIDEMENT WOUND;  Surgeon: Neill Boas, DPM;  Location: [facility];  Service: Podiatry;  Laterality: Bilateral;    Family History: Family History  Problem Relation Age of Onset   Diabetes Mother    Diabetes Other     Social History:  reports that he has never smoked. He has never used smokeless tobacco. He reports current alcohol use. He reports current drug use. Drugs: Marijuana and Cocaine.  Physical Exam: BP (!) 157/83   Pulse (!) 102   Ht 5' 10 (1.778 m)   Wt 250 lb (113.4 kg)   BMI 35.87 kg/m    Constitutional:  Alert and oriented, No acute distress. Cardiovascular: No clubbing, cyanosis, or edema. Respiratory: Normal respiratory effort, no increased work of breathing. GI: Abdomen is soft, nontender, nondistended, no abdominal masses   Laboratory Data: Reviewed, see HPI  Pertinent Imaging: I have personally viewed and interpreted the CT from December 2020, prostate measures 37g, appears to be a small central prostate defect likely from prior outlet procedure, no hydronephrosis or stones.  Assessment & Plan:   63 year old male with obesity, diabetes, sleep apnea compliant with CPAP here today with weak urinary stream, sensation of incomplete emptying, nocturia 3-4 times at night, ED, and questions about PSA screening.  BPH/LUTS: Prior greenlight laser PVP in 2014, small prostate defect on CT from 2020, I recommended trial of Flomax  and risk and benefits discussed.  He was also interested in cystoscopy/TRUS for consideration of more definitive outlet procedures.  Can cancel and change to routine follow-up if symptoms completely resolved with Flomax .  Behavioral strategies discussed regarding avoiding bladder irritants, minimizing fluids prior to bedtime  ED: Previously had bothersome side effects from penile injections, he opted for trial of PDE 5 inhibitors now that his diabetes is much better controlled, risk and benefits discussed.   Trial of Cialis  20 mg on demand, discussed he could also take this every other day.  If no improvement consider referral to re-consider penile prosthesis  PSA screening: Prior PSA normal at 0.22 from September 2021, guidelines reviewed, will re-check today and contact with results   I spent 60 total minutes on the day of the encounter including pre-visit review of the medical record, face-to-face time with the patient, and post visit ordering of labs/imaging/tests.  Extensive review of outside prior urology records and multiple urologic issues.  Redell Burnet, MD 07/03/2023  [facility] [address] [place], [place] [address] [phone]

## 2023-07-03 NOTE — Patient Instructions (Signed)

## 2023-07-04 LAB — PSA TOTAL (REFLEX TO FREE): Prostate Specific Ag, Serum: 0.3 ng/mL (ref 0.0–4.0)

## 2023-07-08 NOTE — Telephone Encounter (Signed)
 Spoke with patient and advised results  Also updated his phone number

## 2023-07-09 ENCOUNTER — Telehealth: Payer: Self-pay

## 2023-07-09 NOTE — Telephone Encounter (Signed)
 The patient called in and left a voicemail wanting to schedule an office visit. I called him letting him know we received his message, and I let him know that we will need a referral before we can schedule him for an appointment.

## 2023-09-04 ENCOUNTER — Other Ambulatory Visit: Payer: Self-pay | Admitting: Urology

## 2023-09-12 NOTE — Progress Notes (Signed)
 [facility] [address] [place], [place] [address]  Pulmonary Sleep Medicine   Office Visit Note  Patient Name: [name] DOB: 11-Jun-1961 MRN [id number]    Chief Complaint: Obstructive Sleep Apnea visit  Brief History:  [name] is seen today for a follow up visit for  CPAP@ 14 cmH2O .  The patient has a 20+ year history of sleep apnea. Patient is not using PAP nightly.  The patient feels rested after sleeping with PAP.  The patient reports benefiting from PAP use. Reported sleepiness is  improved and the Epworth Sleepiness Score is 12 out of 24. The patient will occasionally take naps. The patient complains of the following: pt is in need of supplies . The patient currently is using a Luna CPAP. Unable to download data, however, data is shown on patient's sleep report on unit. The compliance download shows 27% compliance with an average use time of 3 hours 33 minutes. The AHI is 3.1.  The patient does not complain of limb movements disrupting sleep. The patient continues to require PAP therapy in order to eliminate sleep apnea.   ROS  General: (-) fever, (-) chills, (-) night sweat Nose and Sinuses: (-) nasal stuffiness or itchiness, (-) postnasal drip, (-) nosebleeds, (-) sinus trouble. Mouth and Throat: (-) sore throat, (-) hoarseness. Neck: (-) swollen glands, (-) enlarged thyroid, (-) neck pain. Respiratory: - cough, - shortness of breath, - wheezing. Neurologic: - numbness, - tingling. Psychiatric: - anxiety, - depression   Current Medication: Outpatient Encounter Medications as of 09/15/2023  Medication Sig   gabapentin (NEURONTIN) 300 MG capsule Take 300 mg by mouth 3 (three) times daily.   acetaminophen (TYLENOL) 325 MG tablet Take 2 tablets (650 mg total) by mouth every 6 (six) hours as needed for mild pain (or Fever >/= 101).   amLODipine (NORVASC) 10 MG tablet Take by mouth.   aspirin EC 81 MG tablet Take 81 mg by mouth daily.   chlorthalidone  (HYGROTON) 25 MG tablet Take 25 mg by mouth daily.   JARDIANCE 10 MG TABS tablet Take 10 mg by mouth daily.   LANTUS SOLOSTAR 100 UNIT/ML Solostar Pen Inject 45 Units into the skin at bedtime.   losartan (COZAAR) 100 MG tablet Take 100 mg by mouth daily.    metFORMIN (GLUCOPHAGE) 1000 MG tablet TAKE 1 TABLET TWICE DAILY FOR DIABETES   Multiple Vitamin (MULTIVITAMIN) capsule Take 1 capsule by mouth daily.   simvastatin (ZOCOR) 20 MG tablet Take by mouth.   tadalafil (CIALIS) 20 MG tablet Take 1 tablet (20 mg total) by mouth daily as needed for erectile dysfunction (take 45 minutes prior to sexual activity).   tamsulosin (FLOMAX) 0.4 MG CAPS capsule Take 1 capsule (0.4 mg total) by mouth daily.   traMADol (ULTRAM) 50 MG tablet Take 50-100 mg by mouth every 4 (four) hours as needed.   TRUEplus Lancets 33G MISC    Facility-Administered Encounter Medications as of 09/15/2023  Medication   Tdap (BOOSTRIX) injection 0.5 mL    Surgical History: Past Surgical History:  Procedure Laterality Date   AMPUTATION TOE Left    small toe   AMPUTATION TOE Right 04/02/2019   Procedure: AMPUTATION TOE 2ND;  Surgeon: Gwyneth Revels, DPM;  Location: [facility] ORS;  Service: Podiatry;  Laterality: Right;   COLONOSCOPY W/ BIOPSIES AND POLYPECTOMY     FRACTURE SURGERY     I & D EXTREMITY Left 01/11/2016   Procedure: LEFT FOOT IRRIGATION AND DEBRIDEMENT WOUND VAC AND REMOVAL  OF HARDWARE;  Surgeon: Toni Arthurs, MD;  Location: [facility];  Service: Orthopedics;  Laterality: Left;   IRRIGATION AND DEBRIDEMENT FOOT Left 12/14/2021   Procedure: IRRIGATION AND DEBRIDEMENT FOOT;  Surgeon: Gwyneth Revels, DPM;  Location: [facility] ORS;  Service: Podiatry;  Laterality: Left;   LOWER EXTREMITY ANGIOGRAPHY N/A 12/01/2017   Procedure: Lower Extremity Angiography;  Surgeon: Annice Needy, MD;  Location: [facility] INVASIVE CV LAB;  Service: Cardiovascular;  Laterality: N/A;   LOWER EXTREMITY ANGIOGRAPHY Right 12/04/2017   Procedure: Lower  Extremity Angiography;  Surgeon: Annice Needy, MD;  Location: [facility] INVASIVE CV LAB;  Service: Cardiovascular;  Laterality: Right;   LOWER EXTREMITY ANGIOGRAPHY Left 03/12/2018   Procedure: LOWER EXTREMITY ANGIOGRAPHY;  Surgeon: Annice Needy, MD;  Location: [facility] CV LAB;  Service: Cardiovascular;  Laterality: Left;   LOWER EXTREMITY ANGIOGRAPHY Left 12/17/2021   Procedure: Lower Extremity Angiography;  Surgeon: Annice Needy, MD;  Location: [facility] INVASIVE CV LAB;  Service: Cardiovascular;  Laterality: Left;   ORIF TOE FRACTURE Left 06/08/2015   Procedure: OPEN REDUCTION INTERNAL FIXATION (ORIF) LEFT FIFTH METATARSAL BASE FRACTURE NONUNION; CALCANEAL AUTOGRAFT ;  Surgeon: Toni Arthurs, MD;  Location:  SURGERY CENTER;  Service: Orthopedics;  Laterality: Left;   PATELLA RECONSTRUCTION Left 2005   PROSTATE ABLATION  2014   TOE AMPUTATION     partial amputation right great toe   WOUND DEBRIDEMENT Bilateral 11/30/2017   Procedure: DEBRIDEMENT WOUND;  Surgeon: Linus Galas, DPM;  Location: [facility] ORS;  Service: Podiatry;  Laterality: Bilateral;    Medical History: Past Medical History:  Diagnosis Date   Arthritis    Bacterial infection due to Morganella morganii 12/26/2017   BPH (benign prostatic hypertrophy)    Diabetes mellitus    Diabetic neuropathy (HCC)    Hypertension    Metatarsal bone fracture    left 5th toe   Osteomyelitis of toe of right foot (HCC)    Peripheral vascular disease (HCC)    Post-operative infection    and diabetic ulcer left foot   Sepsis due to Streptococcus, group B (HCC) 12/26/2017   Sleep apnea     does wear CPAP   Wears glasses     Family History: Non contributory to the present illness  Social History: Social History   Socioeconomic History   Marital status: Married    Spouse name: [name]   Number of children: 2   Years of education: Not on file   Highest education level: Not on file  Occupational History   Not on file  Tobacco Use    Smoking status: Never    Passive exposure: Never   Smokeless tobacco: Never  Vaping Use   Vaping status: Never Used  Substance and Sexual Activity   Alcohol use: Yes    Comment: occasional    Drug use: Yes    Types: Marijuana, Cocaine    Comment: last month   Sexual activity: Not on file  Other Topics Concern   Not on file  Social History Narrative   Lives with wife. Children all grown up.   Social Drivers of Corporate investment banker Strain: Low Risk  (07/05/2022)   Received from [facility], [facility]   Overall Financial Resource Strain (CARDIA)    Difficulty of Paying Living Expenses: Not very hard  Food Insecurity: No Food Insecurity (07/05/2022)   Received from [facility], [facility]   Hunger Vital Sign    Worried About Running Out  of Food in the Last Year: Never true    Ran Out of Food in the Last Year: Never true  Transportation Needs: No Transportation Needs (08/02/2022)   Received from [facility], [facility]   [facility] - Transportation    Lack of Transportation (Medical): No    Lack of Transportation (Non-Medical): No  Physical Activity: Insufficiently Active (04/01/2019)   Exercise Vital Sign    Days of Exercise per Week: 1 day    Minutes of Exercise per Session: 60 min  Stress: No Stress Concern Present (04/01/2019)   [employer] of Occupational Health - Occupational Stress Questionnaire    Feeling of Stress : Not at all  Social Connections: Socially Integrated (04/01/2019)   Social Connection and Isolation Panel [NHANES]    Frequency of Communication with Friends and Family: More than three times a week    Frequency of Social Gatherings with Friends and Family: More than three times a week    Attends Religious Services: More than 4 times per year    Active Member of [employer] or Organizations: Yes    Attends Engineer, structural: More than 4 times per year    Marital Status: Married  Catering manager Violence: Not At Risk  (04/01/2019)   Humiliation, Afraid, Rape, and Kick questionnaire    Fear of Current or Ex-Partner: No    Emotionally Abused: No    Physically Abused: No    Sexually Abused: No    Vital Signs: Blood pressure 135/73, pulse 77, resp. rate 16, height 5\' 9"  (1.753 m), weight 245 lb (111.1 kg), SpO2 98%. Body mass index is 36.18 kg/m.    Examination: General Appearance: The patient is well-developed, well-nourished, and in no distress. Neck Circumference: 45.5 cm Skin: Gross inspection of skin unremarkable. Head: normocephalic, no gross deformities. Eyes: no gross deformities noted. ENT: ears appear grossly normal Neurologic: Alert and oriented. No involuntary movements.  STOP BANG RISK ASSESSMENT S (snore) Have you been told that you snore?     NO   T (tired) Are you often tired, fatigued, or sleepy during the day?   NO  O (obstruction) Do you stop breathing, choke, or gasp during sleep? NO   P (pressure) Do you have or are you being treated for high blood pressure? YES   B (BMI) Is your body index greater than 35 kg/m? YES   A (age) Are you 62 years old or older? YES   N (neck) Do you have a neck circumference greater than 16 inches?   YES   G (gender) Are you a male? YES   TOTAL STOP/BANG "YES" ANSWERS 5       A STOP-Bang score of 2 or less is considered low risk, and a score of 5 or more is high risk for having either moderate or severe OSA. For people who score 3 or 4, doctors may need to perform further assessment to determine how likely they are to have OSA.         EPWORTH SLEEPINESS SCALE:  Scale:  (0)= no chance of dozing; (1)= slight chance of dozing; (2)= moderate chance of dozing; (3)= high chance of dozing  Chance  Situtation    Sitting and reading: 2    Watching TV: 1    Sitting Inactive in public: 1    As a passenger in car: 3      Lying down to rest: 3    Sitting and talking: 1    Sitting quielty  after lunch: 1    In a car, stopped in  traffic: 0   TOTAL SCORE:   12 out of 24    SLEEP STUDIES:  PSG - 08/03/2021 - AHI 79.9/hr, min Sp02 57% Titration - 08/10/2021 - CPAP @ 14 cmH20   CPAP COMPLIANCE DATA:  Date Range: 30 days  Average Daily Use: 3 hours 33 minutes  Median Use: 3 hours 33 minutes  Compliance for > 4 Hours: 27%  AHI: 3.1 respiratory events per hour  Days Used: 26/30 days , 8 days over 4 hours  Mask Leak: 9.4  95th Percentile Pressure: 14         LABS: Recent Results (from the past 2160 hours)  BLADDER SCAN AMB NON-IMAGING     Status: None   Collection Time: 07/03/23 10:32 AM  Result Value Ref Range   Scan Result   BLADDER SCAN AMB NON-IMAGING     Status: None   Collection Time: 07/03/23 10:54 AM  Result Value Ref Range   Scan Result   PSA Total (Reflex To Free)     Status: None   Collection Time: 07/03/23 11:02 AM  Result Value Ref Range   Prostate Specific Ag, Serum 0.3 0.0 - 4.0 ng/mL    Comment: Roche ECLIA methodology. According to the American Urological Association, Serum PSA should decrease and remain at undetectable levels after radical prostatectomy. The AUA defines biochemical recurrence as an initial PSA value 0.2 ng/mL or greater followed by a subsequent confirmatory PSA value 0.2 ng/mL or greater. Values obtained with different assay methods or kits cannot be used interchangeably. Results cannot be interpreted as absolute evidence of the presence or absence of malignant disease.    Reflex Criteria Comment     Comment: The percent free PSA is performed on a reflex basis only when the total PSA is between 4.0 and 10.0 ng/mL.     Radiology: No results found.  No results found.  No results found.    Assessment and Plan: Patient Active Problem List   Diagnosis Date Noted   Change in bowel habit 06/10/2022   Diverticulosis of colon 06/10/2022   Dysphagia 06/10/2022   Epigastric pain 06/10/2022   Flatulence, eructation and gas pain  06/10/2022   Morbid obesity (HCC) 06/10/2022   Rectal bleeding 06/10/2022   Ventral hernia without obstruction or gangrene 06/10/2022   Diabetic infection of left foot (HCC) 12/12/2021   OSA (obstructive sleep apnea) 06/18/2021   CPAP use counseling 06/18/2021   Gastroesophageal reflux disease 09/04/2020   BPH (benign prostatic hyperplasia) 09/04/2020   Constipation 09/04/2020   Obesity (BMI 30-39.9) 09/04/2020   History of colonic polyps 09/04/2020   Chronic anticoagulationn (PLAVIX) 03/15/2018   Diabetes mellitus due to underlying condition, uncontrolled, with diabetic neuropathy 03/10/2018   Type 2 diabetes mellitus with diabetic polyneuropathy, with long-term current use of insulin (HCC) 03/10/2018   Diabetic polyneuropathy associated with type 2 diabetes mellitus (HCC) 03/10/2018   Atherosclerosis of native arteries of the extremities with ulceration (HCC) 02/24/2018   Sleep apnea 02/24/2018   Diabetic peripheral neuropathy (HCC) 02/24/2018   Vitamin D insufficiency 02/24/2018   Neurogenic pain 02/24/2018   Chronic foot pain (Primary Area of Pain) (Bilateral) (L>R) 02/10/2018   Chronic lower extremity pain (Secondary Area of Pain) (Bilateral) (L>R) 02/10/2018   Chronic pain syndrome 02/10/2018   Long term current use of opiate analgesic 02/10/2018   Pharmacologic therapy 02/10/2018   Disorder of skeletal system 02/10/2018   Problems influencing health  status 02/10/2018   Bacterial infection due to Morganella morganii 12/26/2017   Sepsis due to Streptococcus, group B (HCC) 12/26/2017   HTN (hypertension) 11/29/2017   HLD (hyperlipidemia) 11/29/2017   Osteomyelitis (HCC) 11/29/2017   Cellulitis of foot (Left) 03/05/2017   Surgery, elective    Hardware complicating wound infection (HCC)    Metatarsal stress fracture of left foot 01/12/2016   Diabetic foot infection (HCC) 01/12/2016   Diabetic ulcer of foot associated with type 2 diabetes mellitus, with necrosis of muscle (HCC)  01/11/2016    1. OSA (obstructive sleep apnea) (Primary) The patient does tolerate PAP and reports  benefit from PAP use. HE was not aware of the severity of his apnea and was not using his cpap consistently. We discussed the risks of not treating his apnea and he states he will work on increasing his compliance. The patient was reminded how to clean equipment and advised to replace supplies routinely. The patient was also counselled on weight loss. The compliance is poor. The AHI is 3.1.   OSA on cpap- controlled. Increase compliance with pap. CPAP continues to be medically necessary to treat this patient's OSA. F/u one year.    2. CPAP use counseling CPAP Counseling: had a lengthy discussion with the patient regarding the importance of PAP therapy in management of the sleep apnea. Patient appears to understand the risk factor reduction and also understands the risks associated with untreated sleep apnea. Patient will try to make a good faith effort to remain compliant with therapy. Also instructed the patient on proper cleaning of the device including the water must be changed daily if possible and use of distilled water is preferred. Patient understands that the machine should be regularly cleaned with appropriate recommended cleaning solutions that do not damage the PAP machine for example given white vinegar and water rinses. Other methods such as ozone treatment may not be as good as these simple methods to achieve cleaning.   3. Obesity (BMI 30-39.9) Obesity Counseling: Had a lengthy discussion regarding patients BMI and weight issues. Patient was instructed on portion control as well as increased activity. Also discussed caloric restrictions with trying to maintain intake less than 2000 Kcal. Discussions were made in accordance with the 5As of weight management. Simple actions such as not eating late and if able to, taking a walk is suggested.     General Counseling: I have discussed the  findings of the evaluation and examination with Jake Shark.  I have also discussed any further diagnostic evaluation thatmay be needed or ordered today. Clara verbalizes understanding of the findings of todays visit. We also reviewed his medications today and discussed drug interactions and side effects including but not limited excessive drowsiness and altered mental states. We also discussed that there is always a risk not just to him but also people around him. he has been encouraged to call the office with any questions or concerns that should arise related to todays visit.  No orders of the defined types were placed in this encounter.       I have personally obtained a history, examined the patient, evaluated laboratory and imaging results, formulated the assessment and plan and placed orders. This patient was seen today by Emmaline Kluver, PA-C in collaboration with Dr. Freda Munro.   Yevonne Pax, MD [facility] Diplomate ABMS Pulmonary Critical Care Medicine and Sleep Medicine

## 2023-09-15 ENCOUNTER — Ambulatory Visit (INDEPENDENT_AMBULATORY_CARE_PROVIDER_SITE_OTHER): Admitting: Internal Medicine

## 2023-09-15 VITALS — BP 135/73 | HR 77 | Resp 16 | Ht 69.0 in | Wt 245.0 lb

## 2023-09-15 DIAGNOSIS — E669 Obesity, unspecified: Secondary | ICD-10-CM | POA: Diagnosis not present

## 2023-09-15 DIAGNOSIS — Z7189 Other specified counseling: Secondary | ICD-10-CM | POA: Diagnosis not present

## 2023-09-15 DIAGNOSIS — G4733 Obstructive sleep apnea (adult) (pediatric): Secondary | ICD-10-CM

## 2023-09-15 NOTE — Patient Instructions (Signed)

## 2023-10-16 ENCOUNTER — Other Ambulatory Visit: Payer: 59 | Admitting: Urology

## 2023-10-16 ENCOUNTER — Emergency Department
Admission: EM | Admit: 2023-10-16 | Discharge: 2023-10-16 | Disposition: A | Discharge status: Home / Self Care | Attending: Emergency Medicine | Admitting: Emergency Medicine

## 2023-10-16 ENCOUNTER — Other Ambulatory Visit: Payer: Self-pay

## 2023-10-16 ENCOUNTER — Emergency Department

## 2023-10-16 DIAGNOSIS — R051 Acute cough: Secondary | ICD-10-CM | POA: Diagnosis present

## 2023-10-16 DIAGNOSIS — R1032 Left lower quadrant pain: Secondary | ICD-10-CM | POA: Insufficient documentation

## 2023-10-16 DIAGNOSIS — R109 Unspecified abdominal pain: Secondary | ICD-10-CM

## 2023-10-16 DIAGNOSIS — E119 Type 2 diabetes mellitus without complications: Secondary | ICD-10-CM | POA: Insufficient documentation

## 2023-10-16 LAB — RESP PANEL BY RT-PCR (RSV, FLU A&B, COVID)  RVPGX2
Influenza A by PCR: NEGATIVE
Influenza B by PCR: NEGATIVE
Resp Syncytial Virus by PCR: NEGATIVE
SARS Coronavirus 2 by RT PCR: NEGATIVE

## 2023-10-16 MED ORDER — PSEUDOEPH-BROMPHEN-DM 30-2-10 MG/5ML PO SYRP: 5.0000 mL | ORAL_SOLUTION | Freq: Four times a day (QID) | ORAL | 0 refills | Status: AC | PRN

## 2023-10-16 MED ORDER — HYDROCODONE-ACETAMINOPHEN 5-325 MG PO TABS
1.0000 | ORAL_TABLET | Freq: Once | ORAL | Status: AC
  Administered 2023-10-16: 1 via ORAL
  Filled 2023-10-16: qty 1

## 2023-10-16 MED ORDER — CYCLOBENZAPRINE HCL 10 MG PO TABS: 10.0000 mg | ORAL_TABLET | Freq: Three times a day (TID) | ORAL | 0 refills | Status: AC | PRN

## 2023-10-16 MED ORDER — MELOXICAM 15 MG PO TABS: 15.0000 mg | ORAL_TABLET | Freq: Every day | ORAL | 0 refills | Status: AC

## 2023-10-16 NOTE — ED Triage Notes (Signed)
 Pt here with a cough. Pt states he is not coughing anything up. Pt states he started sneezing last night and thinks he may have injured his ribs from coughing so much. Pt denies fevers.

## 2023-10-16 NOTE — ED Notes (Signed)
 See triage note  Presents with cough  States he has had this cough for a few days  Developed pain to left rib area after sneezing las pm

## 2023-10-16 NOTE — ED Provider Notes (Signed)
 [facility] Provider Note    None    (approximate)   History   Cough    HPI  [name] is a 63 y.o. male      with a past medical history of DM, with no significant past medical history who presents to the ED complaining of cough, abdominal wall pain.   According to the patient yesterday he  sneezed really hard and produce left abdominal wall pain.  Patient states pain increased with movement, and cough.  Patient denies fever or chills vomit or urinary symptoms      Physical Exam   Triage Vital Signs: ED Triage Vitals [10/16/23 1541]  Encounter Vitals Group     BP (!) 144/81     Systolic BP Percentile      Diastolic BP Percentile      Pulse Rate 96     Resp 16     Temp 98.3 F (36.8 C)     Temp Source Oral     SpO2 97 %     Weight 244 lb 14.9 oz (111.1 kg)     Height 5\' 9"  (1.753 m)     Head Circumference      Peak Flow      Pain Score 10     Pain Loc      Pain Education      Exclude from Growth Chart     Most recent vital signs: Vitals:   10/16/23 1541 10/16/23 1935  BP: (!) 144/81 (!) 140/80  Pulse: 96 85  Resp: 16 16  Temp: 98.3 F (36.8 C)   SpO2: 97% 98%     Constitutional: Alert, NAD. Able to speak in complete sentences without cough or dyspnea  Eyes: Conjunctivae are normal.  Head: Atraumatic. Nose: No congestion/rhinnorhea. Mouth/Throat: Mucous membranes are moist.   Neck: Painless ROM. Supple. No JVD, nodes, thyromegaly  Cardiovascular:   Good peripheral circulation.RRR no murmurs, gallops, rubs  Respiratory: Normal respiratory effort.  No retractions. Clear to auscultation bilaterally without wheezing or crackles  Gastrointestinal: Skin is intact without ecchymosis or scars.  Bowel sounds positive. soft and tender to palpation in the left lower quadrant.  McBurney point negative, Rovsing negative. Musculoskeletal:  no deformity Neurologic:  MAE spontaneously. No gross focal neurologic deficits are appreciated.   Skin:  Skin is warm, dry and intact. No rash noted. Psychiatric: Mood and affect are normal. Speech and behavior are normal.    ED Results / Procedures / Treatments   Labs (all labs ordered are listed, but only abnormal results are displayed) Labs Reviewed  RESP PANEL BY RT-PCR (RSV, FLU A&B, COVID)  [id number]     EKG     RADIOLOGY I independently reviewed and interpreted imaging and agree with radiologists findings.     PROCEDURES:  Critical Care performed:   Procedures   MEDICATIONS ORDERED IN ED: Medications  HYDROcodone-acetaminophen (NORCO/VICODIN) 5-325 MG per tablet 1 tablet (1 tablet Oral Given 10/16/23 1931)   Clinical Course as of 10/16/23 2300  Thu Oct 16, 2023  1747 Resp panel by RT-PCR (RSV, Flu A&B, Covid) Anterior Nasal Swab Negative [AE]  1838 DG Chest 2 View No active cardiopulmonary disease. [AE]    Clinical Course User Index [AE] Gladys Damme, PA-C    IMPRESSION / MDM / ASSESSMENT AND PLAN / ED COURSE  I reviewed the triage vital signs and the nursing notes.  Differential diagnosis includes, but is not limited to, abdominal wall pain, pneumonia, diverticulitis  Patient's presentation is most consistent with acute complicated illness / injury requiring diagnostic workup. Patient's diagnosis is consistent with abdominal wall pain, cough. I independently reviewed and interpreted imaging and agree with radiologists findings ruling out pneumonia, fracture. Labs are  reassuring ruling out RSV, influenza, COVID. I did review the patient's allergies and medications.The patient is in stable and satisfactory condition for discharge home. Patient will be discharged home with prescriptions for Flexeril, Mobic, brompheniramine syrup. Patient is to follow up with PCP as needed or otherwise directed. Patient is given ED precautions to return to the ED for any worsening or new symptoms. Discussed plan of care with patient, answered all of patient's questions,  Patient agreeable to plan of care. Advised patient to take medications according to the instructions on the label. Discussed possible side effects of new medications. Patient verbalized understanding.    FINAL CLINICAL IMPRESSION(S) / ED DIAGNOSES   Final diagnoses:  Acute cough  Abdominal wall pain     Rx / DC Orders   ED Discharge Orders          Ordered    meloxicam (MOBIC) 15 MG tablet  Daily        10/16/23 1916    cyclobenzaprine (FLEXERIL) 10 MG tablet  3 times daily PRN        10/16/23 1916    brompheniramine-pseudoephedrine-DM 30-2-10 MG/5ML syrup  4 times daily PRN        10/16/23 1916             Note:  This document was prepared using Dragon voice recognition software and may include unintentional dictation errors.   Awilda Lennox, PA-C 10/16/23 2300    Jacquie Maudlin, MD 10/16/23 2315

## 2023-10-16 NOTE — Discharge Instructions (Addendum)
 You have been diagnosed with viral upper respiratory infection, abdominal wall pain.  Please take cough syrup 5 mL by mouth 4 times per day as needed for cough, please take Flexeril 1 tablet by mouth 3 times daily as needed for pain.  Please take meloxicam 1 tablet with breakfast daily.  Come back to ED or go to your PCP if you have new symptoms or symptoms worsen.  Please drink plenty fluids.  He is to no lift heavy weight

## 2024-08-02 ENCOUNTER — Other Ambulatory Visit: Payer: Self-pay | Admitting: Urology

## 2024-08-12 ENCOUNTER — Encounter: Admitting: Physician Assistant
